# Patient Record
Sex: Female | Born: 1940
Health system: Southern US, Community
[De-identification: ages and names within clinical notes are randomized; demographics above are authoritative.]

## PROBLEM LIST (undated history)

## (undated) DIAGNOSIS — F419 Anxiety disorder, unspecified: Secondary | ICD-10-CM

## (undated) DIAGNOSIS — F329 Major depressive disorder, single episode, unspecified: Secondary | ICD-10-CM

## (undated) DIAGNOSIS — H3530 Unspecified macular degeneration: Secondary | ICD-10-CM

## (undated) DIAGNOSIS — R51 Headache: Secondary | ICD-10-CM

## (undated) DIAGNOSIS — C4491 Basal cell carcinoma of skin, unspecified: Secondary | ICD-10-CM

## (undated) DIAGNOSIS — K219 Gastro-esophageal reflux disease without esophagitis: Secondary | ICD-10-CM

## (undated) DIAGNOSIS — H9313 Tinnitus, bilateral: Secondary | ICD-10-CM

## (undated) DIAGNOSIS — M8580 Other specified disorders of bone density and structure, unspecified site: Secondary | ICD-10-CM

## (undated) DIAGNOSIS — H409 Unspecified glaucoma: Secondary | ICD-10-CM

## (undated) DIAGNOSIS — J302 Other seasonal allergic rhinitis: Secondary | ICD-10-CM

## (undated) DIAGNOSIS — G4710 Hypersomnia, unspecified: Secondary | ICD-10-CM

## (undated) DIAGNOSIS — Z9289 Personal history of other medical treatment: Secondary | ICD-10-CM

## (undated) DIAGNOSIS — R519 Headache, unspecified: Secondary | ICD-10-CM

## (undated) DIAGNOSIS — E785 Hyperlipidemia, unspecified: Secondary | ICD-10-CM

## (undated) DIAGNOSIS — K922 Gastrointestinal hemorrhage, unspecified: Secondary | ICD-10-CM

## (undated) DIAGNOSIS — F32A Depression, unspecified: Secondary | ICD-10-CM

## (undated) DIAGNOSIS — M1990 Unspecified osteoarthritis, unspecified site: Secondary | ICD-10-CM

## (undated) DIAGNOSIS — H8110 Benign paroxysmal vertigo, unspecified ear: Secondary | ICD-10-CM

## (undated) HISTORY — PX: STAPEDES SURGERY: SHX789

## (undated) HISTORY — DX: Other specified disorders of bone density and structure, unspecified site: M85.80

## (undated) HISTORY — PX: JOINT REPLACEMENT: SHX530

## (undated) HISTORY — PX: CATARACT EXTRACTION W/ INTRAOCULAR LENS  IMPLANT, BILATERAL: SHX1307

## (undated) HISTORY — PX: EYE SURGERY: SHX253

## (undated) HISTORY — PX: BLEPHAROPLASTY: SUR158

## (undated) HISTORY — DX: Benign paroxysmal vertigo, unspecified ear: H81.10

## (undated) HISTORY — DX: Hypersomnia, unspecified: G47.10

## (undated) HISTORY — PX: TUBAL LIGATION: SHX77

---
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
## 1998-07-20 ENCOUNTER — Other Ambulatory Visit: Admission: RE | Admit: 1998-07-20 | Discharge: 1998-07-20 | Payer: Self-pay | Admitting: *Deleted

## 1999-07-02 DIAGNOSIS — C4491 Basal cell carcinoma of skin, unspecified: Secondary | ICD-10-CM

## 1999-07-02 HISTORY — DX: Basal cell carcinoma of skin, unspecified: C44.91

## 1999-07-02 HISTORY — PX: BASAL CELL CARCINOMA EXCISION: SHX1214

## 2000-04-22 ENCOUNTER — Other Ambulatory Visit: Admission: RE | Admit: 2000-04-22 | Discharge: 2000-04-22 | Payer: Self-pay | Admitting: *Deleted

## 2004-04-05 ENCOUNTER — Ambulatory Visit (HOSPITAL_COMMUNITY): Admission: RE | Admit: 2004-04-05 | Discharge: 2004-04-05 | Payer: Self-pay | Admitting: Gastroenterology

## 2004-05-11 ENCOUNTER — Encounter: Admission: RE | Admit: 2004-05-11 | Discharge: 2004-05-11 | Payer: Self-pay | Admitting: Otolaryngology

## 2004-05-14 ENCOUNTER — Ambulatory Visit (HOSPITAL_COMMUNITY): Admission: RE | Admit: 2004-05-14 | Discharge: 2004-05-14 | Payer: Self-pay | Admitting: Otolaryngology

## 2004-05-14 ENCOUNTER — Ambulatory Visit (HOSPITAL_BASED_OUTPATIENT_CLINIC_OR_DEPARTMENT_OTHER): Admission: RE | Admit: 2004-05-14 | Discharge: 2004-05-14 | Payer: Self-pay | Admitting: Otolaryngology

## 2007-07-10 ENCOUNTER — Inpatient Hospital Stay (HOSPITAL_COMMUNITY): Admission: RE | Admit: 2007-07-10 | Discharge: 2007-07-14 | Payer: Self-pay | Admitting: Orthopedic Surgery

## 2007-10-20 ENCOUNTER — Inpatient Hospital Stay (HOSPITAL_COMMUNITY): Admission: EM | Admit: 2007-10-20 | Discharge: 2007-10-26 | Payer: Self-pay | Admitting: Emergency Medicine

## 2007-10-23 ENCOUNTER — Encounter: Payer: Self-pay | Admitting: Internal Medicine

## 2009-06-13 ENCOUNTER — Encounter: Admission: RE | Admit: 2009-06-13 | Discharge: 2009-06-13 | Payer: Self-pay | Admitting: Internal Medicine

## 2010-05-22 ENCOUNTER — Encounter
Admission: RE | Admit: 2010-05-22 | Discharge: 2010-06-05 | Payer: Self-pay | Source: Home / Self Care | Admitting: Internal Medicine

## 2010-11-13 NOTE — Consult Note (Signed)
[name], [name]                 ACCOUNT NO.:  [id number]   MEDICAL RECORD NO.:  [id number]          PATIENT TYPE:  INP   LOCATION:  4743                         FACILITY:  [facility]   PHYSICIAN:  Sharlet Salina T. Hoxworth, M.D.DATE OF BIRTH:  1941-04-23   DATE OF CONSULTATION:  10/23/2007  DATE OF DISCHARGE:                                 CONSULTATION   CHIEF COMPLAINT:  Bright red blood per rectum.   HISTORY OF PRESENT ILLNESS:  I was asked by Dr. Carman Ching to  evaluate [name].  She is very pleasant, generally healthy, 70-year-  old white female who was admitted on October 20, 2007, with a lower GI  bleed.  This began at home, actually, the day before with intermittent  bright red blood per rectum mixed with stool.  The patient called Dr.  Waynard Edwards a day after the symptoms started and was admitted.  She continued  to have some intermittent bleeding for about 24 hours during this  hospitalization and received 2 units of packed red blood cells.  She was  very stable throughout.  The bleeding appeared to stop during her  colonoscopy prep two days ago.  Yesterday, she had a colonoscopy, which  revealed no active bleeding.  There was extensive diverticulosis  throughout the left and sigmoid colon seen.  The patient had no further  bleeding after this, and was discharged home this morning.  However,  about lunch time, she had a couple of bowel movements with some red  blood mixed with otherwise normal stool.  This was bright red blood.  She has represented to the emergency room.  She had been feeling well  without lightheadedness and dizziness.  No abdominal pain.  No fever or  chills.  She has no chronic abdominal complaints other than some  occasional mild constipation.  No history of diverticulitis.  She has  not had any significant abdominal surgery.  The patient at the time of  this evaluation has had one nonbloody bowel movement since arriving in  the emergency room.  Her hemoglobin  was 9.3 prior to discharge.  Current  lab is pending.   PAST MEDICAL HISTORY:  She does have a history of the lower GI bleed  about 4 years ago followed only as an outpatient, not requiring  hospitalization at that time, felt possibly hemorrhoidal.  She is also  followed for hyperlipidemia, history of anxiety, and depression.  She  has osteoarthritis and some tachycardia.   PAST SURGICAL HISTORY:  Previous surgery includes left knee replacement  in January 2009, cataract surgery, tubal ligation, and tonsillectomy.   MEDICATIONS:  1. Aspirin 81 mg daily.  2. Darvocet p.r.n. for arthritic pain.  3. Zoloft 100 mg daily.  4. Xanax 0.25 b.i.d. p.r.n.  5. Multivitamins.  6. Zocor.  7. Propranolol 20 mg p.r.n.   ALLERGIES:  CEPHALOSPORIN and TETANUS.   SOCIAL HISTORY:  She is married, retired Runner, broadcasting/film/video, drinks an occasional  glass of wine, and quit cigarettes about 10 years ago.   FAMILY HISTORY:  Significant for heart disease in her parents.   REVIEW  OF SYSTEMS:  GENERAL:  Weight stable.  No fever or chills.  HEENT:  Denies vision, hearing, or swallowing problems.  RESPIRATORY:  No shortness breath, cough, or wheezing.  CARDIAC:  Rare palpitations.  No chest pain.  ABDOMEN:  Negative as above.   PHYSICAL EXAMINATION:  VITAL SIGNS:  She is afebrile, heart rate 94,  blood pressure 132/77, and respirations 20.  GENERAL:  Alert, healthy-appearing white female in no acute stress.  SKIN:  Warm and dry.  No rash or infection.  HEENT:  No palpable mass or thyromegaly.  Sclerae nonicteric.  Oropharynx is clear.  LYMPH NODES:  No cervical, subclavicular, or inguinal nodes palpable.  LUNGS:  Clear without wheezing or increased work of breathing.  CARDIAC:  Regular rate and rhythm.  No murmurs.  No edema.  ABDOMEN:  Soft, very minimal right upper quadrant tenderness.  No  guarding.  No palpable masses.  RECTAL:  Deferred.  EXTREMITIES:  No edema.  NEUROLOGIC:  Alert and fully oriented.   Motor and sensory deficits  appears normal.   LABORATORY DATA:  Normal, laboratory all pending at this time.   ASSESSMENT/PLAN:  Lower GI bleed, intermittent over the last 3-4 days.  Probably diverticular based on recent colonoscopy.  Exact location is  unclear.  The patient is awaiting a bleeding scan, but currently does  not appear to have active bleeding.  She certainly at this point would  not reach the threshold for requiring surgery if this episode  stabilizes.  We will follow during her hospitalization with you.      Lorne Skeens. Hoxworth, M.D.  Electronically Signed     BTH/MEDQ  D:  10/23/2007  T:  10/24/2007  Job:  [id number]

## 2010-11-13 NOTE — Op Note (Signed)
[name], HILGERT                 ACCOUNT NO.:  [id number]   MEDICAL RECORD NO.:  [id number]          PATIENT TYPE:  INP   LOCATION:  2899                         FACILITY:  [facility]   PHYSICIAN:  [name]. [name], M.D. DATE OF BIRTH:  1941-01-06   DATE OF PROCEDURE:  07/10/2007  DATE OF DISCHARGE:                               OPERATIVE REPORT   PREOPERATIVE DIAGNOSES:  Left-knee end-stage osteoarthritis.   POSTOPERATIVE DIAGNOSES:  Left-knee end-stage osteoarthritis.   PROCEDURES PERFORMED:  Left total knee arthroplasty, using DePuy Sigma  rotating platform prosthesis.   ATTENDING SURGEON:  [name]. [name], M.D.   ASSISTANT:  Donnie Coffin. Durwin Nora, P.A.   ANESTHESIA:  General anesthesia plus femoral block anesthesia was used.   ESTIMATED BLOOD LOSS:  Minimal.   FLUID REPLACEMENT:  2000 mL crystalloid.   TOURNIQUET TIME:  One hour and 30 minutes at 300 mmHg.   URINE OUTPUT:  200 mL.   INSTRUMENT COUNT:  Correct.   COMPLICATIONS:  None.   ANTIBIOTICS GIVEN:  None.   INDICATIONS:  Patient is a 70 year old female with end-stage arthritis  of the left knee.  She has failed conservative management.  At this  point, presents for operative treatment to restore function and  eliminate her pain.  Informed consent was obtained.   DESCRIPTION OF PROCEDURE:  After an adequate level of anesthesia was  achieved, the patient was placed in supine on the operating room table,  the left leg correctly identified.  A nonsterile tourniquet was placed  on the proximal thigh.  The left leg was then sterilely prepped and  draped in the usual manner.  We exsanguinated the limb, using an Esmarch  bandage and tourniquet was elevated to 300 mmHg.  A longitudinal midline  incision was created with the knee in flexion.  Dissection was carried  sharply down through the subcutaneous tissues, using Bovie  electrocautery.  A medial parapatellar arthrotomy was created, using a  fresh #10 blade scalpel.  We  everted the patella, then opened the distal  femur with a step-cut drill, using our distal cutting block, which was  set on 5 degrees left with a 10 mm resection.  Once we had resected the  distal femur, the decision was made to proceed to the tibia due to  exposure and went ahead and cut the tibia with an external alignment  jig, 4 mm off the affected medial side.   We next were able to gain exposure to the distal femur and did our  anterior and posterior cuts after sizing the femur that was a size 2.5  and placing our four-in-one cutting block on the distal femur.  We made  our anterior/posterior cuts and chamfer cuts.  We checked our extension  and flexion gaps and were symmetric at 10 mm.  Next, we went ahead and  resected the bone for the posterior cruciate substituting femoral  prosthesis, using the box cut guide.  We went ahead and made our box cut  and then went to our tibia, sizing to a 2.5 and then doing our keel  punch and  drilling out the well for the mobile bearing prosthesis.  Once  we had our tibia and femur prepared, we removed the bone off the  posterior aspect of the distal femur, using a 3/4 inch curved osteotome.  There were some osteophytes on the posterior aspects of the femoral  condyle that were removed.  All extraneous soft tissue, remnants of  meniscal tissue and cruciate ligaments were removed and we placed our  trial components in with a 10 mm insert.  We had excellent soft-tissue  balance and full extension.   We then resurfaced the patella with the free-hand technique, starting  with a 23 mm thickness and going down to 15 to accommodate the 8 mm of  thickness with the 32 patella.  We drilled our lugs for the 32 patella,  placed the patella button on and then took the knee through a full range  of motion.  Again, excellent patellar tracking.  We then went ahead and  removed all trial components, thoroughly irrigated, plugged the end of  the distal femur with  available bone, then went ahead and using vacuum  mixing cement, cemented the components into place with the high  viscosity DePuy Smart Set cement.  With the components in place and  excess cement removed, we allowed the cement to cure, then trialed the  knee again.  Again the 10 mm insert allowed for full extension and gave  [place] excellent balance in flexion/extension for soft-tissue balancing  purposes.   We then placed the real component, checking for excess cement  posteriorly, and then placed the real component in, taking the knee  through a full range of motion.  Again appropriate balance and alignment  was noted.   At this point, we thoroughly irrigated the knee and closed the knee,  using interrupted #1 PDS suture for the parapatellar arthrotomy,  followed by 0 and 2-0 Vicryl for subcutaneous closure and 4-0 Monocryl  for the skin.  Steri-Strips were applied, followed by a sterile  dressing.  The patient tolerated surgery well.      [name]. [name], M.D.  Electronically Signed     SRN/MEDQ  D:  07/10/2007  T:  07/10/2007  Job:  [id number]

## 2010-11-13 NOTE — H&P (Signed)
[name], [name]                 ACCOUNT NO.:  [id number]   MEDICAL RECORD NO.:  [id number]          PATIENT TYPE:  INP   LOCATION:  NA                           FACILITY:  [facility]   PHYSICIAN:  Maisie Fus B. Dixon, P.A.  DATE OF BIRTH:  18-Aug-1940   DATE OF ADMISSION:  DATE OF DISCHARGE:                              HISTORY & PHYSICAL   CHIEF COMPLAINT:  Left knee pain.   HISTORY OF PRESENT ILLNESS:  The patient is a 70 year old female with  worsening left knee pain that has been refractory to conservative  treatment.  The patient has elected to have a left total knee  arthroplasty by Dr. Malon Kindle.   PAST MEDICAL HISTORY:  1. Impaired hearing.  2. Impaired vision.  3. Vertigo.  4. Hyperlipidemia.  5. History of basal cell carcinoma.   FAMILY MEDICAL HISTORY:  Coronary artery disease, congestive heart  failure, and hypertension.   SOCIAL HISTORY:  A patient of Dr. Waynard Edwards, occasional alcohol use, does  not smoke, is married, and does not use any illicit drugs.   DRUG ALLERGIES:  CEFADROXIL gives her hives.   CURRENT MEDICATIONS:  1. Propranolol 20 mg one tab p.o. daily.  2. Zoloft 100 mg p.o. daily.  3. Simvastatin 40 mg one-half q.h.s.  4. Darvocet 100 one to two tabs daily p.r.n.  5. Vitamin C 500 mg two in the morning.  6. 81 mg aspirin one daily.  7. Calcium Citrate one daily p.r.n.  8. Vitamin D 1000 units daily.  9. Clorazepate 3.75 mg one tab three to four times a week.   REVIEW OF SYSTEMS:  Painful range of motion and decreased hearing, he  uses hearing aids.   PHYSICAL EXAMINATION:  VITAL SIGNS:  Pulse 76, respirations 18, blood  pressure 136/82.  GENERAL:  The patient is a healthy-appearing, 70 year old female in no  acute distress, pleasant mood and affect, alert and oriented x3.  The  patient has full range of motion of the cervical spine without any  tenderness.  Cranial nerves II-XII are grossly intact.  CHEST:  Active breath sounds bilaterally with  no wheezes, rhonchi, or  rales.  HEART:  Regular rate and rhythm, no murmur.  ABDOMEN:  Nontender and nondistended with active bowel sounds.  EXTREMITIES:  Moderate tenderness to the left knee especially with  medial joint line crepitus with range of motion.  She has no rashes or  edema.  She is neurovascularly intact to the bilateral upper and lower  extremities.   X-ray show left knee end-stage osteoarthritis.   IMPRESSION:  Left knee end-stage osteoarthritis.   PLAN:  A left total knee arthroplasty by Dr. Malon Kindle.      Thomas B. Ferne Coe.     TBD/MEDQ  D:  06/30/2007  T:  06/30/2007  Job:  [id number]

## 2010-11-13 NOTE — Discharge Summary (Signed)
[name], Betty                 ACCOUNT NO.:  [id number]   MEDICAL RECORD NO.:  [id number]          PATIENT TYPE:  INP   LOCATION:  5003                         FACILITY:  [facility]   PHYSICIAN:  [name]. [name], M.D. DATE OF BIRTH:  28-Aug-1940   DATE OF ADMISSION:  07/10/2007  DATE OF DISCHARGE:  07/14/2007                               DISCHARGE SUMMARY   ADMISSION DIAGNOSIS:  Left knee end-stage osteoarthritis.   DISCHARGE DIAGNOSIS:  Left knee end-stage osteoarthritis.   BRIEF HISTORY:  The patient is a 69 year old female with worsening left  knee pain secondary to osteoarthritis.  The patient has elected to have  a left total knee arthroplasty by Dr. Malon Kindle.   PROCEDURE:  The patient had a left total knee arthroplasty using the  DePuy sigma rotating platform system by Dr. Malon Kindle on July 10, 2007.  Assistant was [employer], PA-C.  General anesthesia plus a  femoral block was used.  Estimated blood loss was minimal and 2000 mL of  fluid replacement.  No complications.   HOSPITAL COURSE:  The patient was admitted on July 10, 2007 for the  above-stated procedure which she tolerated well.  After adequate time in  the post anesthesia care unit, she was transferred up to 5000.  Postop  day #1 she complained of some moderate pain to that left knee.  She was  able to work with her CPM. and was able to work with physical therapy  over the next several days.  Her pain did decrease moderately before  discharge home.  Her labs were within acceptable limits.  No signs of  cellulitis, erythema, or infection to her incision.  Neurovascularly she  was intact.  She was able to work with the CPM to about 0-90 degrees  before discharge home on July 14, 2007.   DISCHARGE/PLAN:  The patient will be discharged home on July 14, 2007  with home physical therapy, and home health nursing.  Her condition is  stable.  Her diet is regular.  The patient has an allergy to  CEFADROXIL  MONOHYDRATE.   DISCHARGE MEDICATIONS:  1. Aspirin 81 mg p.o. daily.  2. Polyethylene glycol 17 grams p.o. nightly.  3. Inderal 20 mg p.o. daily.  4. Zoloft 100 mg p.o. daily.  5. Zocor 20 mg p.o. daily.  6. Coumadin per pharmacy protocol.  7. Darvocet N 100 one to two tablets q.4-6 h. p.r.n. pain.  8. Clorazepate 1.875 mg p.o. every two days.   FOLLOWUP:  The patient will follow back up with Dr. Malon Kindle.      Thomas B. Durwin Nora, P.A.      [name]. [name], M.D.  Electronically Signed    TBD/MEDQ  D:  07/14/2007  T:  07/14/2007  Job:  [id number]

## 2010-11-13 NOTE — Consult Note (Signed)
[name], [name]                 ACCOUNT NO.:  [id number]   MEDICAL RECORD NO.:  [id number]           PATIENT TYPE:   LOCATION:                                 FACILITY:   PHYSICIAN:  Stephani Police, PA    DATE OF BIRTH:  07-04-1940   DATE OF CONSULTATION:  10/21/2007  DATE OF DISCHARGE:                                 CONSULTATION   We were asked to see [name] today in consultation for lower GI bleed  by Dr. Waynard Edwards.   HISTORY OF PRESENT ILLNESS:  This is a 70 year old female with a history  of GI bleed 3 years ago.  She has had several small intermittent bleeds  since, but now they brought her to the hospital.  She describes red  rectal bleeding that started on Saturday and has continued since.  She  has had no severe pain, but describes a small amount of right-sided pain  that has subsequently resolved.  She has no reports of excessive NSAID  use.  No epigastric or lower abdominal pain.  No vomiting.  No fever.  No anorexia or weight loss.  She did have a recent knee replacement  surgery in January and was on Coumadin for a period of time, but that  has been discontinued and her INR is currently 1.3.  Her last  colonoscopy was in October 2005 done by Dr. Carman Ching of [facility].  He found extensive diverticulosis.   PAST MEDICAL HISTORY:  Significant for the GI bleed in 2006,  hyperlipidemia, anxiety and depression, and osteoarthritis.  She is  again status post knee replacement in January 2009.  History of  tachycardia for which she is on propranolol.   SURGERIES:  Include the knee replacement, cataract removal, bilateral  tubal ligation, tonsils and adenoids removal as well as a D&C,  postmenopausal.   ALLERGIES:  She has allergies to cephalosporins and tetanus vaccination.   CURRENT MEDICATIONS:  Include 81 mg aspirin, Darvocet-N 100, Zoloft,  Xanax, multivitamin, Zocor, and propranolol.   SOCIAL HISTORY:  Positive for occasional alcohol.  She smoked for  approximately 20 years, but has been a nonsmoker for several years.   FAMILY HISTORY:  Her family history is positive for colon cancer in a  paternal uncle as well as multiple first-degree relatives with multiple  stomach ailments.   REVIEW OF SYSTEMS:  Negative.   PHYSICAL EXAMINATION:  She is alert and oriented, in no apparent  distress.  Her temperature is 97.6, pulse is 90, respirations are 18,  and blood pressure is 104/59.  Her heart has a regular rate and rhythm.  Her lungs are clear to auscultation bilaterally.  Her abdomen is soft,  nontender, and nondistended with good bowel sounds.  On rectal exam, she  has no external hemorrhoids.  She does have what feels like internal  hemorrhoids.  There is no tenderness on the exam and she has a large  amount of red blood on my glove once it is removed.   LABORATORY DATA:  Labs show a current hemoglobin of 8.2 and hematocrit  24.0.  She was admitted with a hemoglobin of 10.3 with an MCV value of  94.4.  White count yesterday was 6.6.  BMET is within normal limits.  She is guaiac-positive.  On October 20, 2007, her INR was 1.1.  Her PT is  14.7.   ASSESSMENT:  Dr. Carman Ching has seen and examined the patient,  collected a history and reviewed her chart.  His impression is that she  is currently having a lower GI bleed, likely diverticular.  We will  transfuse her today and prep her for a colonoscopy first thing in the  morning.      Stephani Police, [place]     MLY/MEDQ  D:  10/21/2007  T:  10/21/2007  Job:  [id number]   cc:   Gaspar Garbe, M.D.  Redge Gainer. Perini, M.D.  [name] L. Malon Kindle., M.D.

## 2010-11-13 NOTE — H&P (Signed)
NAMEMARQUASHA, BRUTUS                 ACCOUNT NO.:  [id number]   MEDICAL RECORD NO.:  [id number]          PATIENT TYPE:  INP   LOCATION:  4707                         FACILITY:  [facility]   PHYSICIAN:  Gaspar Garbe, M.D.DATE OF BIRTH:  1941/03/01   DATE OF ADMISSION:  10/20/2007  DATE OF DISCHARGE:                              HISTORY & PHYSICAL   CHIEF COMPLAINT:  Bright red blood per rectum.   HISTORY OF PRESENT ILLNESS:  The patient is a 70 year old white female  with a prior history of lower GI bleed for which she has had colonoscopy  in the past three years.  She has never had a source found, and this was  presumed to be hemorrhoidal.  She had an episode about a year and half  ago which was fairly unremarkable and did not notify any physician at  that point.  She indicated that she had had some bleeding on Saturday  and indicated maybe some right upper quadrant pain, tenderness or  fullness but was of a low grade.  As the bleeding seemed to stop by  itself, she did not contact any physician over the course of the  weekend.  She had a little bit more blood from her bowel movement this  afternoon and contacted our office and was instructed by Dr. Waynard Edwards to  get to the emergency room.  She indicates that she has continued to have  bleeding from her rectum and noted some even with her attempts to give a  urinalysis this evening.  Has seemed to have slowed, and she is not  having any cramping or discomfort at this time.  She was evaluated in  the ER and will require admission for continued care.   The patient once again denies any abdominal pain, has not had any trauma  in the area and only takes a baby aspirin.  She has not used any other  nonsteroidal such as ibuprofen, Naprosyn, or headache powders in the  past several months, nor is she taking any herbal.  She appears to be  fairly comfortable at [facility] point.   PAST MEDICAL HISTORY:  1. History of lower GI bleed as noted above.  2. Hyperlipidemia.  3. Anxiety/depression.  4. 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  5. History of tachycardia for which she takes propranolol.   PAST SURGICAL HISTORY:  1. Knee replacement performed January 2009.  2. Cataract surgery.  3. Tubal ligation.  4. Tonsils and adenoids.  5. History of D&C shortly after menopause.   ALLERGIES:  PRESUMED TO A CEPHALOSPORIN ANTIBIOTIC OR TETANUS VACCINE AS  SHE HAD A RASH.   MEDICATIONS:  1. Aspirin 81 mg daily.  2. Darvocet-N 100 one  tablet q.6h. p.r.n. as needed for pain.  The      patient states using this sparingly.  3. Zoloft 100 mg p.o. daily.  4. Xanax 0.25 mg b.i.d. p.r.n.  5. Multivitamin.  6. Zocor 10 mg p.o. daily.  7. Propranolol 20 mg p.o. p.r.n.   SOCIAL HISTORY:  The patient lives in [place] with her husband.  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  She is married, has a 20 pack-year smoking history, but has  not smoked since the early 1990s.  She has 3 to 4 ounces of wine 3 times  a week.   FAMILY HISTORY:  Mother died with a MI at age 3.  Father died of  congestive heart failure and pneumonia.  He also had a history of  bladder cancer.  He was 85.  She has one sister with a history of  tachycardia and hypertension as well.   REVIEW OF SYSTEMS:  The patient denies any fevers, chills, sweats,  headache, nausea, vomiting.  The patient indicates bright red blood from  rectum but no abdominal pain.  She denies any weakness, chest pain,  shortness of breath, or dyspnea on exertion over the course of the day,  and 10-point review of systems is otherwise negative.   ADVANCE DIRECTIVES:  The patient is a full code.   PHYSICAL EXAMINATION:  Temperature 96.5, pulse 108, respiratory rate 22,  blood pressure 115/58, saturating 98% on room air.  GENERAL:  No acute distress.  HEENT:  Normocephalic, atraumatic.  PERRLA.  EOMI.  ENT is within normal  limits.   NECK:  Supple.  No lymphadenopathy, JVD, or bruit.  HEART:  Regular rate and rhythm to tachycardiac.  No murmur, rub or  gallop are appreciated.  LUNGS:  Clear to auscultation bilaterally.  ABDOMEN:  Hyperactive bowel sounds.  No hepatosplenomegaly.  No  tenderness.  No guarding or rebound is noted.  EXTREMITIES:  No clubbing, cyanosis, or edema.  RECTAL:  The patient is grossly heme-positive.  NEUROLOGICAL:  Patient is oriented to person, place and time.   LABORATORY TESTS:  White count 6.6, hemoglobin 10.3, hematocrit 30.4,  platelets 184.  PT is 14.7 with an INR of 1.1.  BUN and creatinine are  19 and 0.8, respectively, potassium 3.9, glucose elevated in the 180s.  Urinalysis showed a specific gravity of 1.030, pH of 5, slight ketones  but negative for blood or infection.   ASSESSMENT/PLAN:  1. Bright red blood per rectum, painless.  This is most likely related      to either diverticular bleed, hemorrhoidal bleed, or less likely      colon polyps or cancer since she has had recent screening in the      past 3 years, although it is possible she will be kept off her      aspirin.  Will be made n.p.o. until she is seen by GI.  She will      most likely require a colonoscopy as an inpatient.  She will have      H&Hs done every six hours and will require transfusion if her      hemoglobin drops below 8.5, seems to be relatively stable now, and      her bleeding may have in fact stopped.  She may be just passing the      remainder of her earlier bleed, and she appears to be      hemodynamically stable otherwise.  I will resuscitate her with IV      fluids and place a second peripheral IV.  She does not require a      central line at this time and appears to have good peripheral      access.  2. Hyperlipidemia.  Continue Zocor.  3. Anxiety/depression.  Continue Zoloft and Xanax as needed.  4.  History of arthritis.  She will take Darvocet or Tylenol type      products as needed for  pain, avoiding nonsteroidals.  The patient      indicates the need for prophylaxis presurgery, and this is noted      because of her artificial knee.  5. The patient will be placed on Protonix as GI prophylaxis, although      her bleeding is most likely lower.  She will be attempted to      ambulate as soon as possible but will do this with assistance.      Given her hemodynamic status, she will not have Lovenox for DVT      prophylaxis given her active bleeding.      Gaspar Garbe, M.D.  Electronically Signed     RWT/MEDQ  D:  10/20/2007  T:  10/21/2007  Job:  [id number]   cc:   Fayrene Fearing L. Malon Kindle., M.D.  Redge Gainer. Perini, M.D.

## 2010-11-13 NOTE — Op Note (Signed)
NAMESANTASIA, REW                 ACCOUNT NO.:  [id number]   MEDICAL RECORD NO.:  [id number]          PATIENT TYPE:  INP   LOCATION:  4707                         FACILITY:  [facility]   PHYSICIAN:  [name] L. Malon Kindle., M.D.DATE OF BIRTH:  06-23-1941   DATE OF PROCEDURE:  10/22/2007  DATE OF DISCHARGE:                               OPERATIVE REPORT   PROCEDURE:  Colonoscopy.   MEDICATIONS:  1. Fentanyl 50 mcg.  2. Versed 6 mg IV.   INDICATION:  Lower GI bleeding along with the previous history of  diverticulosis.   DESCRIPTION OF PROCEDURE:  Procedure explained to the patient.  Consent  obtained.  In the left lateral decubitus position, the Pentax pediatric  scope inserted and advanced.  Prep excellent.  No active bleeding.  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  Once we got beyond this, we were able to advance rapidly through  the cecum.  The ileocecal valve and appendiceal orifice were seen.  The  scope withdrawn and the cecum, ascending, transverse colon were free of  polyps or other lesions.  The descending and sigmoid colon showed  extensive diverticular disease, but no polyps, tumors, or other lesions.  In the rectum, the scope was withdrawn and there were internal  hemorrhoids.  There was no active bleeding at all.  The patient  tolerated the procedure well and went to the recovery room in good  condition.   ASSESSMENT:  1. Lower GI bleeding probably due to extensive diverticulosis.  2. Internal hemorrhoids.   PLAN:  We will follow the patient clinically.  Treat diverticular  disease on MiraLax and fiber and advance her diet.  We will discuss with  her the options at this point.           ______________________________  Llana Aliment. Malon Kindle., M.D.     Waldron Session  D:  10/22/2007  T:  10/22/2007  Job:  [id number]

## 2010-11-16 NOTE — H&P (Signed)
[name], [name]                 ACCOUNT NO.:  [id number]   MEDICAL RECORD NO.:  [id number]          PATIENT TYPE:  AMB   LOCATION:  DSC                          FACILITY:  [facility]   PHYSICIAN:  Hermelinda Medicus, M.D.   DATE OF BIRTH:  05/12/1941   DATE OF ADMISSION:  05/14/2004  DATE OF DISCHARGE:                                HISTORY & PHYSICAL   HISTORY OF PRESENT ILLNESS:  This patient is a 70 year old female who was  seen by our audiologist, concerned about her hearing loss, especially in the  left ear.  She was seen to have an upper frequency sensorineural hearing  loss on both sides, but in the lower frequencies, there was a considerable  divergence with the sensorineural hearing improved markedly, but the  conductive hearing decreased markedly down to a 60-decibel loss at 250 with  a 10-decibel sensorineural hearing status at 250; this at 500 became a 50  and a 30, and then began to merge in the higher frequencies; this is typical  of an early otosclerosis.  The patient wished to hear as well on the left  ear as on the right, which was essentially a 30- to 20-decibel mixed loss.  The speech perception threshold on the left was a 50-decibel loss, where the  right was a 30.  The discrimination score was 80% with an 84% discrimination  score on the right and the impedance study was essentially unremarkable,  with this history and with his audiologic findings indicative of an  otosclerosis on the left ear.  The patient considered all the alternatives  of a hearing aid or having an exploratory tympanotomy with stapedectomy and  she decided to try to improve her hearing via the surgical approach.  She  now enters for an exploratory tympanotomy and possible stapedectomy.   PAST HISTORY:  Her past history is one of a normal sinus rhythm EKG with  nonspecific T wave abnormalities.  Chest x-ray was heart size and  mediastinal contours are unremarkable, lungs are clear.  Visualized  skeleton  is unremarkable.  Her laboratory was also in good condition, showing no  abnormalities.  Her further past history is one will drink occasionally,  never smoked, had a BTL surgery and that was the only surgery, history of  diverticulosis.   REVIEW OF SYSTEMS:  The remainder of her review of systems is essentially  unremarkable.   MEDICATIONS:  The only thing she takes is Tylenol p.r.n.   ALLERGIES:  Her allergy is to CEPHALEXIN, causing hives.   LABORATORY DATA:  Hemoglobin from CBC was 11.5.   PHYSICAL EXAMINATION:  HEENT:  Her ears are clear.  Her tympanic membranes  are clear and free of any infection.  The nose is clear.  The oral cavity is  clear of any ulceration or mass.  True cords, false cords, epiglottis, base  of tongue are clear of any ulceration or mass.  True cord mobility, gag  reflex, tongue mobility, EOMs, facial nerves are all symmetrical.  The  larynx is clear.  NECK:  The neck is free of any  thyromegaly, cervical adenopathy or mass.  CHEST:  Chest is clear.  No rales, rhonchi or wheezes.  CARDIOVASCULAR:  No opening snaps, murmurs or gallops.  ABDOMEN:  Unremarkable.  EXTREMITIES:  Unremarkable.   INITIAL DIAGNOSIS:  1.  Otosclerosis, left, with conductive hearing loss.  2.  History of diverticulosis.  3.  History of high-frequency sensorineural hearing loss.   PLAN:  Our plan is to do MAC anesthesia with general standby and local  anesthesia the left exploratory tympanotomy and possible stapedectomy.  Possibly, we will find a fixed malleus or be able to mobilize the stapes and  avoid some of the postoperative vertigo that is not unusual.       JC/MEDQ  D:  05/14/2004  T:  05/14/2004  Job:  [id number]   cc:   Loraine Leriche A. Waynard Edwards, M.D.  [address]  [place]  [place] [address]  Fax: [phone]

## 2010-11-16 NOTE — Discharge Summary (Signed)
Betty, [name]                 ACCOUNT NO.:  [id number]   MEDICAL RECORD NO.:  [id number]          PATIENT TYPE:  INP   LOCATION:  4707                         FACILITY:  [facility]   PHYSICIAN:  Mark A. Perini, M.D.   DATE OF BIRTH:  December 21, 1940   DATE OF ADMISSION:  10/20/2007  DATE OF DISCHARGE:  10/23/2007                               DISCHARGE SUMMARY   DISCHARGE DIAGNOSES:  1. Lower gastrointestinal bleeding, presumed to be secondary to      diverticulosis in the setting of aspirin therapy.  2. Anemia of acute blood loss.  3. History of palpitations.  4. Anxiety and depression.  5. Hyperlipidemia.  6. Osteoarthritis.   PROCEDURES:  1. GI and general surgery consultation.  2. Colonoscopy performed on October 22, 2007, which showed no active      bleeding but she did have external hemorrhoids and internal      hemorrhoids and severe diverticulosis involving the descending and      sigmoid colon.  3. Nuclear medicine bleeding scan performed on October 23, 2007, which      was negative.  4. Transfusion of 2 units of packed red cells   DISCHARGE MEDICATIONS:  1. She is to stop aspirin and use Darvocet-N 100 one every 6 hours as      needed.  2. She is to resume Zoloft 100 mg once daily.  3. Xanax 0.25 mg twice a day as needed.  4. Multivitamin daily.  5. Zocor 10 mg each evening.  6. Propranolol 20 mg each morning and she may repeat dose 1 time in      the p.m. if needed.  7. Over-the-counter ferrous gluconate 325 mg once daily.  8. Over-the-counter Colace 1 mg 2-3 tablets daily as needed as a stool      softener.  9. MiraLax 17 grams in 8 ounces of water daily as needed as a      laxative.   HISTORY OF PRESENT ILLNESS:  [name] is a pleasant 70 year old female  who had a history of lower GI bleed in the last 3 years.  It was  presumed to be hemorrhoidal or diverticular related.  She noted some  bleeding a few days prior to admission with some low-grade right upper  quadrant tenderness and fullness.  The bleeding seemed to stop by itself  and she did not contact the physician over the weekend.  However, she  had some bright red blood on the afternoon of admission and was  instructed to come to the emergency room.  She continued to have  bleeding from her rectum of bright red blood and was evaluated by the  emergency room and felt to require admission.   HOSPITAL COURSE:  Karuna Balducci was admitted to a telemetry bed.  She did  drop her hemoglobin at 8.2 and was transfused 2 units of packed red  cells with appropriate response.  She underwent colonoscopy with results  as noted above.  On October 23, 2007, she was actually discharged.  However, as soon as she got home, she had recurrent bright red  bleeding  per rectum and returned immediately to the hospital and was immediately  readmitted within a few hours of her original discharge on October 23, 2007.  At that time, she was monitored.  She did not drop her hemoglobin  further.  She had no more recurrent bleeding.  She did have a nuclear  medicine bleeding scan, but at that point it was negative.  It was felt  that her bleeding was most likely due to diverticulosis.  Discussion was  made as to whether or not to proceed with a partial colectomy, however,  this was deferred at the current time.  Her followup algorithm at this  point will be that, if she has severely bleeding she would need a left-  sided colectomy, if she has re-bleeding but is medically stable she  should repeat a bleeding scan, and if that is positive resect the region  of interest, if it is negative and she is stable, she could consider  capsule endoscopy or go ahead and proceed with a left colectomy and if  she has no further bleeding, of course she will not need any further  procedures for the time being.  By October 26, 2007, she was deemed stable  for discharge home.   DISCHARGE PHYSICAL EXAMINATION:  VITAL SIGNS:  Temperature 97.9,   afebrile, pulse 85, respiratory rate 18, blood pressure 106/61, 97%  saturation on room air.  GENERAL:  She is in no acute distress.  She was sitting on the bedside.  LUNGS:  Clear to auscultation bilaterally.  HEART:  Regular rate and rhythm with a 1/6 murmur heard to left and  right sternal border in systole.  ABDOMEN:  Soft, nontender, nondistended with no mass or  hepatosplenomegaly.   DISCHARGE LABORATORY DATA:  White count 4.1 with a normal differential,  hemoglobin 9.1, platelet count 149,000.  LFTs were normal with exception  of an albumin of 2.8, BUN 5, creatinine 0.55, glucose 100.   DISCHARGE INSTRUCTIONS:  She is to follow a heart-healthy diet.  She is  to follow up with Dr. Randa Evens per his instructions.  She is to follow up  with Dr. Waynard Edwards in 3-4 weeks.  She is return to the lab next week for  followup blood work.  She is to follow up with Dr. Johna Sheriff in the next  2-3 weeks.      Mark A. Perini, M.D.  Electronically Signed     MAP/MEDQ  D:  11/26/2007  T:  11/26/2007  Job:  [id number]   cc:   Fayrene Fearing L. Malon Kindle., M.D.  Lorne Skeens. Hoxworth, M.D.

## 2010-11-16 NOTE — Op Note (Signed)
NAMEADRIEANA, [name]                 ACCOUNT NO.:  [id number]   MEDICAL RECORD NO.:  [id number]          PATIENT TYPE:  AMB   LOCATION:  DSC                          FACILITY:  [facility]   PHYSICIAN:  Hermelinda Medicus, M.D.   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   OPERATION:  Exploratory tympanotomy and stapes mobilization.   SURGEON:  Hermelinda Medicus, M.D.   ANESTHESIA:  Local MAC with Dr. Gelene Mink.   DESCRIPTION OF PROCEDURE:  The patient placed in the supine position under  local anesthesia, 1% Xylocaine with epinephrine injected into the four  quadrants of the external ear canal and after the patient was prepped and  draped in the appropriate manner, using Betadine scrub and then Betadine  prep and after the usual otologic head drape and ear drape was placed, the  external ear canal was carefully examined.  The four quadrants of the  external ear canal were injected with 1% Xylocaine with epinephrine.  The  patient was very much relaxed and had no problem with this.  A tympanotomy  incision was then carried out in the posterior aspect of the external ear  canal carried down along the posterior canal wall.  The annulus was elevated  appropriately and the entire tympanic membrane was reflected anteriorly  toward the malleus.  The incus could be seen in part and the chorda tympany  could also be seen and this was guarded carefully.  Posterior canal wall was  taken down using the stapes curette.  Once this was completed, the chorda  tympany was then elevated away from where we were working on the posterior  canal wall and this was taken down somewhat more.  Then we could see the  entire stapes.  We measured this at a 4.7 that we would make if we needed to  do a  stapedectomy, however, we also started to stress the crura of the  stapes which were originally found to be somewhat fixed but then we were  able to get some mobility and then we were able to stress it anterior,  posterior, laterally and able to get more and more mobility and with this we  could see the foot plate of the staples moving very nicely now and then we  were able to elicit a round window reflex showing that the stapes mobility  was in good condition.  We also checked of course the malleus and incus for  its mobility.  With this, we felt that we would do nothing more as the  stapes was now considerably mobile as well as the incus and malleus.  We had  good conduction.  We then placed a piece of Gelfoam over the foot plate to  just protect it as if we created a __________ fistula and we then placed the  tympanic membrane and canal skin back in its proper position.  We then  tested her hearing by essentially using the lowest voice I possibly could in  asking her numbers to repeat and she repeated all but one accurately.  With  this, we then felt she had a successful postoperative course and placed  Gelfoam in the external ear canal along the incisional site to be followed  by Neosporin ointment in the external  canal to be followed by cotton.  The patient awakened well.  She has no  vertigo and had no facial nerve abnormalities and the patient is doing well  postoperatively.  The follow-up will be in one week, three weeks, six weeks,  three months, six months and a year.       JC/MEDQ  D:  05/14/2004  T:  05/14/2004  Job:  [id number]   cc:   Loraine Leriche A. Waynard Edwards, M.D.  [address]  [place]  [place] [address]  Fax: [phone]

## 2010-11-16 NOTE — Op Note (Signed)
[name], [name]                 ACCOUNT NO.:  [id number]   MEDICAL RECORD NO.:  [id number]          PATIENT TYPE:  AMB   LOCATION:  ENDO                         FACILITY:  [facility]   PHYSICIAN:  [name]., M.D.DATE OF BIRTH:  1941-04-30   DATE OF PROCEDURE:  04/05/2004  DATE OF DISCHARGE:                                 OPERATIVE REPORT   PROCEDURE:  Colonoscopy.   MEDICATIONS:  Fentanyl 70 mcg, Versed 7 mg IV.   SCOPE:  Olympus pediatric adjustable colonoscope.   INDICATIONS:  The patient had a recent bout of rectal bleeding we thought  might have been hemorrhoids.  It has subsequently cleared up.   DESCRIPTION OF PROCEDURE:  With the patient in the left lateral decubitus  position, the Olympus scope was inserted and advanced.  The prep was  excellent and we were able to easily advance around to the cecum.  Ileocecal  valve and appendiceal orifice were seen. There were no AVMs seen in the  cecum.  There was no signs of active or recent bleeding.  The scope was  withdrawn in the cecum.  Ascending colon and transverse colon were seen  well.  The descending and sigmoid colon revealed extensive diverticular  disease with multiple small and large mouth diverticula throughout the  entire left colon.  No active bleeding.  There were some balls of hard stool  seen.  No polyps were seen throughout the entire colon.  The scope was  withdrawn down in the rectum and rectum was free of polyps.  There were  small internal hemorrhoids in the rectum.  The scope was withdrawn.  The  patient tolerated the procedure well.   ASSESSMENT:  1.  Rectal bleeding, probably secondary to diverticulosis.  78.1.  2.  Extensive diverticular disease, left colon. 562.1.  3.  Small internal hemorrhoids. 455.0.   PLAN:  We will give her diverticulosis information.  We will have the  patient take a high-fiber diet and fiber supplements.  We will start on  MiraLax daily.  See back in the office in  three months or p.r.n.       JLE/MEDQ  D:  04/05/2004  T:  04/05/2004  Job:  [id number]   cc:   Loraine Leriche A. Waynard Edwards, M.D.  [address]  [place]  [place] [address]  Fax: [phone]

## 2011-03-20 LAB — CBC
HCT: 32.6 — ABNORMAL LOW
HCT: 34.1 — ABNORMAL LOW
HCT: 35.7 — ABNORMAL LOW
HCT: 42.5
Hemoglobin: 11.3 — ABNORMAL LOW
Hemoglobin: 11.6 — ABNORMAL LOW
Hemoglobin: 12.4
Hemoglobin: 14.5
MCHC: 34
MCHC: 34.1
MCHC: 34.6
MCHC: 34.7
MCV: 95.7
MCV: 96.2
MCV: 96.4
MCV: 97.1
Platelets: 111 — ABNORMAL LOW
Platelets: 120 — ABNORMAL LOW
Platelets: 121 — ABNORMAL LOW
Platelets: 191
RBC: 3.38 — ABNORMAL LOW
RBC: 3.51 — ABNORMAL LOW
RBC: 3.71 — ABNORMAL LOW
RBC: 4.44
RDW: 12.8
RDW: 12.8
RDW: 13
RDW: 13.2
WBC: 5.7
WBC: 5.9
WBC: 7.9
WBC: 9.2

## 2011-03-20 LAB — URINALYSIS, ROUTINE W REFLEX MICROSCOPIC
Bilirubin Urine: NEGATIVE
Glucose, UA: NEGATIVE
Hgb urine dipstick: NEGATIVE
Ketones, ur: NEGATIVE
Nitrite: NEGATIVE
Protein, ur: NEGATIVE
Specific Gravity, Urine: 1.018
Urobilinogen, UA: 0.2
pH: 6.5

## 2011-03-20 LAB — PROTIME-INR
INR: 1
INR: 1.2
INR: 1.7 — ABNORMAL HIGH
INR: 2.2 — ABNORMAL HIGH
INR: 2.3 — ABNORMAL HIGH
Prothrombin Time: 13.3
Prothrombin Time: 15.8 — ABNORMAL HIGH
Prothrombin Time: 20.5 — ABNORMAL HIGH
Prothrombin Time: 25.5 — ABNORMAL HIGH
Prothrombin Time: 26.4 — ABNORMAL HIGH

## 2011-03-20 LAB — DIFFERENTIAL
Basophils Absolute: 0
Basophils Relative: 0
Eosinophils Absolute: 0.1
Eosinophils Relative: 1
Lymphocytes Relative: 22
Lymphs Abs: 1.2
Monocytes Absolute: 0.6
Monocytes Relative: 11
Neutro Abs: 3.7
Neutrophils Relative %: 66

## 2011-03-20 LAB — TYPE AND SCREEN
ABO/RH(D): O POS
Antibody Screen: NEGATIVE

## 2011-03-20 LAB — BASIC METABOLIC PANEL
BUN: 4 — ABNORMAL LOW
BUN: 5 — ABNORMAL LOW
BUN: 7
BUN: 9
CO2: 29
CO2: 29
CO2: 31
CO2: 31
Calcium: 8.4
Calcium: 8.5
Calcium: 8.5
Calcium: 9.6
Chloride: 100
Chloride: 101
Chloride: 103
Chloride: 104
Creatinine, Ser: 0.64
Creatinine, Ser: 0.67
Creatinine, Ser: 0.72
Creatinine, Ser: 0.73
GFR calc Af Amer: >60
GFR calc Af Amer: >60
GFR calc Af Amer: >60
GFR calc Af Amer: >60
GFR calc non Af Amer: >60
GFR calc non Af Amer: >60
GFR calc non Af Amer: >60
GFR calc non Af Amer: >60
Glucose, Bld: 124 — ABNORMAL HIGH
Glucose, Bld: 127 — ABNORMAL HIGH
Glucose, Bld: 139 — ABNORMAL HIGH
Glucose, Bld: 98
Potassium: 3.6
Potassium: 3.7
Potassium: 4
Potassium: 4.2
Sodium: 136
Sodium: 137
Sodium: 138
Sodium: 143

## 2011-03-20 LAB — APTT: aPTT: 29

## 2011-03-20 LAB — ABO/RH: ABO/RH(D): O POS

## 2011-03-26 LAB — TYPE AND SCREEN: Antibody Screen: NEGATIVE

## 2011-03-26 LAB — COMPREHENSIVE METABOLIC PANEL
ALT: 11
ALT: 13
ALT: 14
ALT: 14
AST: 19
AST: 28
Albumin: 2.7 — ABNORMAL LOW
Albumin: 2.8 — ABNORMAL LOW
Albumin: 3 — ABNORMAL LOW
Albumin: 3.1 — ABNORMAL LOW
Albumin: 3.1 — ABNORMAL LOW
Alkaline Phosphatase: 43
Alkaline Phosphatase: 52
Alkaline Phosphatase: 59
BUN: 15
BUN: 3 — ABNORMAL LOW
BUN: 4 — ABNORMAL LOW
BUN: 6
CO2: 26
CO2: 30
Calcium: 8.3 — ABNORMAL LOW
Calcium: 8.4
Calcium: 8.6
Calcium: 8.7
Calcium: 8.8
Chloride: 106
Chloride: 109
Chloride: 112
Creatinine, Ser: 0.51
Creatinine, Ser: 0.55
Creatinine, Ser: 0.6
Creatinine, Ser: 0.6
Creatinine, Ser: 0.65
GFR calc Af Amer: >60
GFR calc Af Amer: >60
GFR calc non Af Amer: >60
GFR calc non Af Amer: >60
Glucose, Bld: 119 — ABNORMAL HIGH
Glucose, Bld: 130 — ABNORMAL HIGH
Glucose, Bld: 93
Potassium: 3.5
Potassium: 3.7
Potassium: 3.8
Potassium: 3.9
Sodium: 139
Sodium: 142
Sodium: 144
Total Bilirubin: 0.5
Total Bilirubin: 0.5
Total Bilirubin: 0.6
Total Protein: 4.6 — ABNORMAL LOW
Total Protein: 4.8 — ABNORMAL LOW
Total Protein: 5 — ABNORMAL LOW
Total Protein: 5.2 — ABNORMAL LOW
Total Protein: 5.5 — ABNORMAL LOW

## 2011-03-26 LAB — CBC
HCT: 25.1 — ABNORMAL LOW
HCT: 27 — ABNORMAL LOW
HCT: 27 — ABNORMAL LOW
HCT: 28.1 — ABNORMAL LOW
HCT: 29.3 — ABNORMAL LOW
HCT: 29.3 — ABNORMAL LOW
HCT: 29.4 — ABNORMAL LOW
HCT: 30.4 — ABNORMAL LOW
Hemoglobin: 10.2 — ABNORMAL LOW
Hemoglobin: 10.3 — ABNORMAL LOW
Hemoglobin: 10.3 — ABNORMAL LOW
Hemoglobin: 8.6 — ABNORMAL LOW
Hemoglobin: 9.1 — ABNORMAL LOW
Hemoglobin: 9.3 — ABNORMAL LOW
Hemoglobin: 9.8 — ABNORMAL LOW
MCHC: 33.7
MCHC: 33.8
MCHC: 33.9
MCHC: 34.6
MCHC: 34.8
MCHC: 34.8
MCV: 91.1
MCV: 91.4
MCV: 92.2
MCV: 92.4
Platelets: 128 — ABNORMAL LOW
Platelets: 148 — ABNORMAL LOW
Platelets: 168
Platelets: 176
RBC: 2.66 — ABNORMAL LOW
RBC: 3.17 — ABNORMAL LOW
RBC: 3.22 — ABNORMAL LOW
RDW: 12.6
RDW: 14.6
RDW: 14.6
RDW: 14.7
RDW: 14.8
RDW: 15.2
RDW: 15.5
WBC: 4.1
WBC: 4.6
WBC: 4.9
WBC: 6.5
WBC: 7.2

## 2011-03-26 LAB — URINALYSIS, ROUTINE W REFLEX MICROSCOPIC
Bilirubin Urine: NEGATIVE
Glucose, UA: NEGATIVE
Hgb urine dipstick: NEGATIVE
Ketones, ur: 15 — AB
Nitrite: NEGATIVE
Protein, ur: NEGATIVE
Specific Gravity, Urine: >1.03 — ABNORMAL HIGH
Urobilinogen, UA: 0.2
pH: 5

## 2011-03-26 LAB — DIFFERENTIAL
Basophils Absolute: 0
Basophils Absolute: 0
Basophils Absolute: 0
Basophils Absolute: 0
Basophils Relative: 0
Basophils Relative: 0
Basophils Relative: 0
Basophils Relative: 1
Eosinophils Absolute: 0.1
Eosinophils Absolute: 0.1
Eosinophils Absolute: 0.2
Eosinophils Relative: 1
Eosinophils Relative: 4
Lymphocytes Relative: 33
Lymphocytes Relative: 38
Lymphocytes Relative: 40
Lymphs Abs: 1.4
Lymphs Abs: 2.1
Monocytes Absolute: 0.4
Monocytes Absolute: 0.4
Monocytes Absolute: 0.5
Monocytes Absolute: 0.5
Monocytes Absolute: 0.6
Monocytes Absolute: 0.6
Monocytes Relative: 12
Monocytes Relative: 13 — ABNORMAL HIGH
Monocytes Relative: 6
Neutro Abs: 2
Neutro Abs: 2.4
Neutrophils Relative %: 44
Neutrophils Relative %: 77

## 2011-03-26 LAB — POCT I-STAT, CHEM 8
BUN: 19
Creatinine, Ser: 0.8
Hemoglobin: 10.2 — ABNORMAL LOW
Potassium: 3.9
Sodium: 139

## 2011-03-26 LAB — BASIC METABOLIC PANEL
GFR calc non Af Amer: >60
Glucose, Bld: 109 — ABNORMAL HIGH
Potassium: 3.6
Sodium: 138

## 2011-03-26 LAB — HEMOGLOBIN AND HEMATOCRIT, BLOOD
HCT: 23.5 — ABNORMAL LOW
Hemoglobin: 8 — ABNORMAL LOW

## 2011-03-26 LAB — OCCULT BLOOD X 1 CARD TO LAB, STOOL: Fecal Occult Bld: POSITIVE

## 2011-03-26 LAB — PHOSPHORUS: Phosphorus: 3.2

## 2011-11-29 ENCOUNTER — Encounter (HOSPITAL_COMMUNITY): Payer: Self-pay

## 2011-11-29 ENCOUNTER — Inpatient Hospital Stay (HOSPITAL_COMMUNITY): Admission: RE | Admit: 2011-11-29 | Discharge: 2011-11-29 | Payer: Self-pay | Source: Ambulatory Visit

## 2011-11-29 HISTORY — DX: Other seasonal allergic rhinitis: J30.2

## 2011-11-29 HISTORY — DX: Gastro-esophageal reflux disease without esophagitis: K21.9

## 2011-11-29 HISTORY — DX: Anxiety disorder, unspecified: F41.9

## 2011-11-29 HISTORY — DX: Hyperlipidemia, unspecified: E78.5

## 2011-11-29 NOTE — Pre-Procedure Instructions (Signed)
20 [name]  11/29/2011   Your procedure is scheduled on:  Friday December 20, 2011.  Report to [facility] at 0530 AM.  Call this number if you have problems the morning of surgery: [phone]   Remember:   Do not eat food:After Midnight.  May have clear liquids: up to 4 Hours before arrival until 0130 am.  Clear liquids include soda, tea, black coffee, apple or grape juice, broth.  Take these medicines the morning of surgery with A SIP OF WATER:    Do not wear jewelry, make-up or nail polish.  Do not wear lotions, powders, or perfumes. You may wear deodorant.  Do not shave 48 hours prior to surgery. Men may shave face and neck.  Do not bring valuables to the hospital.  Contacts, dentures or bridgework may not be worn into surgery.  Leave suitcase in the car. After surgery it may be brought to your room.  For patients admitted to the hospital, checkout time is 11:00 AM the day of discharge.   Patients discharged the day of surgery will not be allowed to drive home.  Name and phone number of your driver:   Special Instructions: CHG Shower Use Special Wash: 1/2 bottle night before surgery and 1/2 bottle morning of surgery.   Please read over the following fact sheets that you were given: Pain Booklet, Coughing and Deep Breathing, Total Joint Packet, MRSA Information and Surgical Site Infection Prevention

## 2011-12-11 ENCOUNTER — Encounter (HOSPITAL_COMMUNITY)
Admission: RE | Admit: 2011-12-11 | Discharge: 2011-12-11 | Disposition: A | Discharge status: Home / Self Care | Payer: Medicare Other | Source: Ambulatory Visit | Attending: Orthopedic Surgery | Admitting: Orthopedic Surgery

## 2011-12-11 ENCOUNTER — Encounter (HOSPITAL_COMMUNITY): Payer: Self-pay | Admitting: Pharmacy Technician

## 2011-12-11 LAB — BASIC METABOLIC PANEL
BUN: 9 mg/dL (ref 6–23)
CO2: 30 mEq/L (ref 19–32)
GFR calc non Af Amer: >90 mL/min (ref >90)
Glucose, Bld: 101 mg/dL — ABNORMAL HIGH (ref 70–99)
Potassium: 4.3 mEq/L (ref 3.5–5.1)

## 2011-12-11 LAB — CBC
Hemoglobin: 14.1 g/dL (ref 12.0–15.0)
MCH: 31.4 pg (ref 26.0–34.0)
RBC: 4.49 MIL/uL (ref 3.87–5.11)

## 2011-12-11 LAB — TYPE AND SCREEN
ABO/RH(D): O POS
Antibody Screen: NEGATIVE

## 2011-12-11 LAB — PROTIME-INR
INR: 1.01 (ref 0.00–1.49)
Prothrombin Time: 13.5 seconds (ref 11.6–15.2)

## 2011-12-11 MED ORDER — CHLORHEXIDINE GLUCONATE 4 % EX LIQD: 60.0000 mL | Freq: Once | CUTANEOUS | Status: DC

## 2011-12-12 NOTE — Consult Note (Signed)
Anesthesia Chart Review:  Patient is a 71 year old female scheduled for right TKA on 12/20/11. Dr. Waynard Edwards is her Internist, and he cleared her for this procedure.  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  EKG from 10/23/11 (PCP) showed NSR, probable nonspecific T wave abnormality in anterior leads which was present on her 07/07/07 EKG.  She had a normal nuclear stress test on 05/14/07 as part of her preoperative evaluation for TKA.  There was normal perfusion, EF 86%.  CXR on 12/11/11 showed no active disease.  Labs noted. UA was not done.  I've asked the Short Stay nursing staff to clarify this with Dr. [name]' office.  Plan to proceed.  Shonna Chock, PA-C

## 2011-12-17 NOTE — H&P (Signed)
CC: right Knee pain HPI:   71  Y/o female    With worsening knee pain secondary to osteoarthritis. Patient has elected to have a total knee arthroplasty to decrease pain and increase function. PMH: pneumonia, depression, hyperlipidemia, anemia Family History: hypertension Social: Dr. Waynard Edwards, no smoking, occasional etoh Meds: propanolol, sertaline, zocor, omeprazole, proair inhaler, timolol, norco, vitamins, miralax,  Allergies: morphine, ? Sulfa, cefadroxil ROS: Pain with ambulation Vitals: 142/82, 74 12 PE: Alert and appropriate    In no acute distress Cranial 2-12 grossly intact Cervical spine full rom without pain Lungs: bilateral breath sounds with no wheeze rhonchi or rales Heart: regular rate and rhythm with no murmur Abd: nontender nondistended with active bowel sounds Ext: moderate pain with rom of right knee with crepitus, antalgic gait, neurovascularly intact distally. 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

## 2011-12-20 ENCOUNTER — Encounter (HOSPITAL_COMMUNITY): Payer: Self-pay | Admitting: Vascular Surgery

## 2011-12-20 ENCOUNTER — Inpatient Hospital Stay (HOSPITAL_COMMUNITY)
Admission: RE | Admit: 2011-12-20 | Discharge: 2011-12-23 | DRG: 470 | Disposition: A | Discharge status: Home Health | Payer: Medicare Other | Source: Ambulatory Visit | Attending: Orthopedic Surgery | Admitting: Orthopedic Surgery

## 2011-12-20 ENCOUNTER — Encounter (HOSPITAL_COMMUNITY)
Admission: RE | Disposition: A | Discharge status: Home Health | Payer: Self-pay | Source: Ambulatory Visit | Attending: Orthopedic Surgery

## 2011-12-20 ENCOUNTER — Encounter (HOSPITAL_COMMUNITY): Payer: Self-pay | Admitting: *Deleted

## 2011-12-20 ENCOUNTER — Inpatient Hospital Stay (HOSPITAL_COMMUNITY): Payer: Medicare Other

## 2011-12-20 ENCOUNTER — Ambulatory Visit (HOSPITAL_COMMUNITY): Payer: Medicare Other | Admitting: Vascular Surgery

## 2011-12-20 DIAGNOSIS — E785 Hyperlipidemia, unspecified: Secondary | ICD-10-CM | POA: Diagnosis present

## 2011-12-20 DIAGNOSIS — Z79899 Other long term (current) drug therapy: Secondary | ICD-10-CM

## 2011-12-20 DIAGNOSIS — IMO0002 Reserved for concepts with insufficient information to code with codable children: Principal | ICD-10-CM | POA: Diagnosis present

## 2011-12-20 DIAGNOSIS — M159 Polyosteoarthritis, unspecified: Secondary | ICD-10-CM | POA: Diagnosis present

## 2011-12-20 DIAGNOSIS — F411 Generalized anxiety disorder: Secondary | ICD-10-CM | POA: Diagnosis present

## 2011-12-20 DIAGNOSIS — Z96659 Presence of unspecified artificial knee joint: Secondary | ICD-10-CM

## 2011-12-20 DIAGNOSIS — M1710 Unilateral primary osteoarthritis, unspecified knee: Principal | ICD-10-CM | POA: Diagnosis present

## 2011-12-20 DIAGNOSIS — Z7901 Long term (current) use of anticoagulants: Secondary | ICD-10-CM

## 2011-12-20 DIAGNOSIS — K219 Gastro-esophageal reflux disease without esophagitis: Secondary | ICD-10-CM | POA: Diagnosis present

## 2011-12-20 HISTORY — DX: Personal history of other medical treatment: Z92.89

## 2011-12-20 HISTORY — DX: Depression, unspecified: F32.A

## 2011-12-20 HISTORY — PX: TOTAL KNEE ARTHROPLASTY: SHX125

## 2011-12-20 HISTORY — DX: Unspecified osteoarthritis, unspecified site: M19.90

## 2011-12-20 HISTORY — DX: Major depressive disorder, single episode, unspecified: F32.9

## 2011-12-20 HISTORY — DX: Gastrointestinal hemorrhage, unspecified: K92.2

## 2011-12-20 SURGERY — ARTHROPLASTY, KNEE, TOTAL
Anesthesia: Regional | Site: Knee | Laterality: Right | Wound class: Clean

## 2011-12-20 MED ORDER — HYDROCODONE-ACETAMINOPHEN 5-325 MG PO TABS
1.0000 | ORAL_TABLET | Freq: Four times a day (QID) | ORAL | Status: DC | PRN
  Administered 2011-12-20 – 2011-12-22 (×6): 2 via ORAL
  Filled 2011-12-20 (×6): qty 2

## 2011-12-20 MED ORDER — VANCOMYCIN HCL IN DEXTROSE 1-5 GM/200ML-% IV SOLN: 1000.0000 mg | INTRAVENOUS | Status: DC

## 2011-12-20 MED ORDER — HYDROMORPHONE HCL PF 1 MG/ML IJ SOLN
INTRAMUSCULAR | Status: AC
  Filled 2011-12-20: qty 1

## 2011-12-20 MED ORDER — VANCOMYCIN HCL IN DEXTROSE 1-5 GM/200ML-% IV SOLN
INTRAVENOUS | Status: AC
  Filled 2011-12-20: qty 200

## 2011-12-20 MED ORDER — ADULT MULTIVITAMIN W/MINERALS CH
1.0000 | ORAL_TABLET | Freq: Every day | ORAL | Status: DC
  Administered 2011-12-20 – 2011-12-23 (×4): 1 via ORAL
  Filled 2011-12-20 (×4): qty 1

## 2011-12-20 MED ORDER — ONDANSETRON HCL 4 MG/2ML IJ SOLN: 4.0000 mg | Freq: Four times a day (QID) | INTRAMUSCULAR | Status: DC | PRN

## 2011-12-20 MED ORDER — AMOXICILLIN 500 MG PO CAPS: 2000.0000 mg | ORAL_CAPSULE | ORAL | Status: DC

## 2011-12-20 MED ORDER — ALPRAZOLAM 0.25 MG PO TABS: 0.2500 mg | ORAL_TABLET | Freq: Every evening | ORAL | Status: DC | PRN

## 2011-12-20 MED ORDER — CELECOXIB 200 MG PO CAPS
200.0000 mg | ORAL_CAPSULE | Freq: Two times a day (BID) | ORAL | Status: DC
  Administered 2011-12-20 – 2011-12-23 (×6): 200 mg via ORAL
  Filled 2011-12-20 (×7): qty 1

## 2011-12-20 MED ORDER — ACETAMINOPHEN 650 MG RE SUPP: 650.0000 mg | Freq: Four times a day (QID) | RECTAL | Status: DC | PRN

## 2011-12-20 MED ORDER — WARFARIN SODIUM 5 MG PO TABS: 5.0000 mg | ORAL_TABLET | Freq: Every day | ORAL | Status: DC

## 2011-12-20 MED ORDER — METOCLOPRAMIDE HCL 5 MG/ML IJ SOLN: 5.0000 mg | Freq: Three times a day (TID) | INTRAMUSCULAR | Status: DC | PRN

## 2011-12-20 MED ORDER — ALBUTEROL SULFATE HFA 108 (90 BASE) MCG/ACT IN AERS: 2.0000 | INHALATION_SPRAY | Freq: Every day | RESPIRATORY_TRACT | Status: DC | PRN

## 2011-12-20 MED ORDER — CALCIUM CITRATE + PO TABS: 1.0000 | ORAL_TABLET | Freq: Two times a day (BID) | ORAL | Status: DC

## 2011-12-20 MED ORDER — WARFARIN SODIUM 5 MG PO TABS
5.0000 mg | ORAL_TABLET | Freq: Once | ORAL | Status: AC
  Administered 2011-12-20: 5 mg via ORAL
  Filled 2011-12-20: qty 1

## 2011-12-20 MED ORDER — ONDANSETRON HCL 4 MG PO TABS: 4.0000 mg | ORAL_TABLET | Freq: Four times a day (QID) | ORAL | Status: DC | PRN

## 2011-12-20 MED ORDER — OXYCODONE HCL 5 MG PO TABS
5.0000 mg | ORAL_TABLET | ORAL | Status: DC | PRN
  Administered 2011-12-20: 10 mg via ORAL
  Administered 2011-12-21: 5 mg via ORAL
  Administered 2011-12-22 – 2011-12-23 (×5): 10 mg via ORAL
  Filled 2011-12-20: qty 1
  Filled 2011-12-20 (×5): qty 2

## 2011-12-20 MED ORDER — METOCLOPRAMIDE HCL 10 MG PO TABS: 5.0000 mg | ORAL_TABLET | Freq: Three times a day (TID) | ORAL | Status: DC | PRN

## 2011-12-20 MED ORDER — METHOCARBAMOL 500 MG PO TABS: 500.0000 mg | ORAL_TABLET | Freq: Three times a day (TID) | ORAL | Status: AC | PRN

## 2011-12-20 MED ORDER — PANTOPRAZOLE SODIUM 40 MG PO TBEC
40.0000 mg | DELAYED_RELEASE_TABLET | Freq: Every day | ORAL | Status: DC
  Administered 2011-12-20 – 2011-12-23 (×4): 40 mg via ORAL
  Filled 2011-12-20 (×4): qty 1

## 2011-12-20 MED ORDER — POTASSIUM CHLORIDE IN NACL 20-0.9 MEQ/L-% IV SOLN
INTRAVENOUS | Status: DC
  Administered 2011-12-20: 13:00:00 via INTRAVENOUS
  Filled 2011-12-20 (×6): qty 1000

## 2011-12-20 MED ORDER — ACETAMINOPHEN 325 MG PO TABS: 650.0000 mg | ORAL_TABLET | Freq: Four times a day (QID) | ORAL | Status: DC | PRN

## 2011-12-20 MED ORDER — WARFARIN - PHARMACIST DOSING INPATIENT: Freq: Every day | Status: DC

## 2011-12-20 MED ORDER — PHENYLEPHRINE HCL 10 MG/ML IJ SOLN
INTRAMUSCULAR | Status: DC | PRN
  Administered 2011-12-20: 80 ug via INTRAVENOUS
  Administered 2011-12-20: 120 ug via INTRAVENOUS

## 2011-12-20 MED ORDER — LACTATED RINGERS IV SOLN
INTRAVENOUS | Status: DC | PRN
  Administered 2011-12-20: 07:00:00 via INTRAVENOUS

## 2011-12-20 MED ORDER — POLYETHYLENE GLYCOL 3350 17 G PO PACK
17.0000 g | PACK | Freq: Every day | ORAL | Status: DC
  Administered 2011-12-20 – 2011-12-22 (×3): 17 g via ORAL
  Filled 2011-12-20 (×4): qty 1

## 2011-12-20 MED ORDER — VITAMIN C 500 MG PO TABS
1000.0000 mg | ORAL_TABLET | Freq: Every day | ORAL | Status: DC
  Administered 2011-12-20 – 2011-12-23 (×4): 1000 mg via ORAL
  Filled 2011-12-20 (×4): qty 2

## 2011-12-20 MED ORDER — OXYCODONE HCL 5 MG PO TABS
ORAL_TABLET | ORAL | Status: AC
  Filled 2011-12-20: qty 2

## 2011-12-20 MED ORDER — ONDANSETRON HCL 4 MG/2ML IJ SOLN
INTRAMUSCULAR | Status: DC | PRN
  Administered 2011-12-20: 4 mg via INTRAVENOUS

## 2011-12-20 MED ORDER — PROPOFOL 10 MG/ML IV BOLUS
INTRAVENOUS | Status: DC | PRN
  Administered 2011-12-20: 150 mg via INTRAVENOUS

## 2011-12-20 MED ORDER — HYDROMORPHONE HCL PF 1 MG/ML IJ SOLN
0.2500 mg | INTRAMUSCULAR | Status: DC | PRN
  Administered 2011-12-20 (×4): 0.5 mg via INTRAVENOUS

## 2011-12-20 MED ORDER — TIMOLOL HEMIHYDRATE 0.5 % OP SOLN: 1.0000 [drp] | Freq: Every day | OPHTHALMIC | Status: DC

## 2011-12-20 MED ORDER — METHOCARBAMOL 500 MG PO TABS
500.0000 mg | ORAL_TABLET | Freq: Four times a day (QID) | ORAL | Status: DC | PRN
  Administered 2011-12-21 – 2011-12-23 (×6): 500 mg via ORAL
  Filled 2011-12-20 (×7): qty 1

## 2011-12-20 MED ORDER — EPHEDRINE SULFATE 50 MG/ML IJ SOLN
INTRAMUSCULAR | Status: DC | PRN
  Administered 2011-12-20 (×2): 10 mg via INTRAVENOUS

## 2011-12-20 MED ORDER — METHOCARBAMOL 100 MG/ML IJ SOLN
500.0000 mg | Freq: Four times a day (QID) | INTRAVENOUS | Status: DC | PRN
  Administered 2011-12-20: 500 mg via INTRAVENOUS
  Filled 2011-12-20: qty 5

## 2011-12-20 MED ORDER — SODIUM CHLORIDE 0.9 % IR SOLN
Status: DC | PRN
  Administered 2011-12-20: 1000 mL
  Administered 2011-12-20: 3000 mL

## 2011-12-20 MED ORDER — HYDROMORPHONE HCL PF 1 MG/ML IJ SOLN: 1.0000 mg | INTRAMUSCULAR | Status: DC | PRN

## 2011-12-20 MED ORDER — VANCOMYCIN HCL IN DEXTROSE 1-5 GM/200ML-% IV SOLN
1000.0000 mg | Freq: Two times a day (BID) | INTRAVENOUS | Status: AC
  Administered 2011-12-21: 1000 mg via INTRAVENOUS
  Filled 2011-12-20: qty 200

## 2011-12-20 MED ORDER — GUAIFENESIN ER 600 MG PO TB12
600.0000 mg | ORAL_TABLET | Freq: Every day | ORAL | Status: DC | PRN
  Filled 2011-12-20: qty 1

## 2011-12-20 MED ORDER — PHENOL 1.4 % MT LIQD: 1.0000 | OROMUCOSAL | Status: DC | PRN

## 2011-12-20 MED ORDER — LORATADINE 10 MG PO TABS
10.0000 mg | ORAL_TABLET | Freq: Every day | ORAL | Status: DC
  Administered 2011-12-20 – 2011-12-23 (×4): 10 mg via ORAL
  Filled 2011-12-20 (×4): qty 1

## 2011-12-20 MED ORDER — FENTANYL CITRATE 0.05 MG/ML IJ SOLN
INTRAMUSCULAR | Status: DC | PRN
  Administered 2011-12-20 (×4): 50 ug via INTRAVENOUS

## 2011-12-20 MED ORDER — OXYCODONE-ACETAMINOPHEN 5-325 MG PO TABS: 1.0000 | ORAL_TABLET | ORAL | Status: AC | PRN

## 2011-12-20 MED ORDER — TIMOLOL MALEATE 0.5 % OP SOLN
1.0000 [drp] | Freq: Every day | OPHTHALMIC | Status: DC
  Administered 2011-12-20 – 2011-12-23 (×4): 1 [drp] via OPHTHALMIC
  Filled 2011-12-20: qty 5

## 2011-12-20 MED ORDER — CALCIUM CITRATE 950 (200 CA) MG PO TABS
200.0000 mg | ORAL_TABLET | Freq: Two times a day (BID) | ORAL | Status: DC
  Administered 2011-12-20 – 2011-12-23 (×6): 200 mg via ORAL
  Filled 2011-12-20 (×9): qty 1

## 2011-12-20 MED ORDER — SERTRALINE HCL 100 MG PO TABS
100.0000 mg | ORAL_TABLET | Freq: Every day | ORAL | Status: DC
  Administered 2011-12-21 – 2011-12-23 (×3): 100 mg via ORAL
  Filled 2011-12-20 (×3): qty 1

## 2011-12-20 MED ORDER — MENTHOL 3 MG MT LOZG: 1.0000 | LOZENGE | OROMUCOSAL | Status: DC | PRN

## 2011-12-20 MED ORDER — VITAMIN D3 25 MCG (1000 UNIT) PO TABS
2000.0000 [IU] | ORAL_TABLET | Freq: Every day | ORAL | Status: DC
  Administered 2011-12-20 – 2011-12-23 (×4): 2000 [IU] via ORAL
  Filled 2011-12-20 (×4): qty 2

## 2011-12-20 MED ORDER — PROPRANOLOL HCL 20 MG PO TABS
20.0000 mg | ORAL_TABLET | Freq: Every day | ORAL | Status: DC
Start: 2011-12-21 — End: 2011-12-23
  Administered 2011-12-22 – 2011-12-23 (×2): 20 mg via ORAL
  Filled 2011-12-20 (×3): qty 1

## 2011-12-20 MED ORDER — MIDAZOLAM HCL 5 MG/5ML IJ SOLN
INTRAMUSCULAR | Status: DC | PRN
  Administered 2011-12-20: 1 mg via INTRAVENOUS

## 2011-12-20 MED ORDER — DOCUSATE SODIUM 100 MG PO CAPS
300.0000 mg | ORAL_CAPSULE | Freq: Every day | ORAL | Status: DC
  Administered 2011-12-20 – 2011-12-22 (×3): 300 mg via ORAL
  Filled 2011-12-20 (×3): qty 3
  Filled 2011-12-20: qty 1

## 2011-12-20 MED ORDER — SIMVASTATIN 20 MG PO TABS
20.0000 mg | ORAL_TABLET | ORAL | Status: DC
  Administered 2011-12-21 – 2011-12-23 (×2): 20 mg via ORAL
  Filled 2011-12-20 (×2): qty 1

## 2011-12-20 MED ORDER — LIDOCAINE HCL (CARDIAC) 20 MG/ML IV SOLN
INTRAVENOUS | Status: DC | PRN
  Administered 2011-12-20: 20 mg via INTRAVENOUS

## 2011-12-20 MED ORDER — BISACODYL 10 MG RE SUPP: 10.0000 mg | Freq: Every day | RECTAL | Status: DC | PRN

## 2011-12-20 SURGICAL SUPPLY — 65 items
BANDAGE ELASTIC 4 VELCRO ST LF (GAUZE/BANDAGES/DRESSINGS) ×2 IMPLANT
BANDAGE ESMARK 6X9 LF (GAUZE/BANDAGES/DRESSINGS) ×1 IMPLANT
BANDAGE GAUZE ELAST BULKY 4 IN (GAUZE/BANDAGES/DRESSINGS) ×4 IMPLANT
BLADE SAG 18X100X1.27 (BLADE) ×2 IMPLANT
BLADE SAW SGTL 13.0X1.19X90.0M (BLADE) ×2 IMPLANT
BNDG CMPR 9X6 STRL LF SNTH (GAUZE/BANDAGES/DRESSINGS) ×1
BNDG CMPR MED 10X6 ELC LF (GAUZE/BANDAGES/DRESSINGS) ×1
BNDG ELASTIC 6X10 VLCR STRL LF (GAUZE/BANDAGES/DRESSINGS) ×2 IMPLANT
BNDG ESMARK 6X9 LF (GAUZE/BANDAGES/DRESSINGS) ×2
BOWL SMART MIX CTS (DISPOSABLE) ×2 IMPLANT
CEMENT HV SMART SET (Cement) ×4 IMPLANT
CLOTH BEACON ORANGE TIMEOUT ST (SAFETY) ×2 IMPLANT
CLSR STERI-STRIP ANTIMIC 1/2X4 (GAUZE/BANDAGES/DRESSINGS) ×1 IMPLANT
COVER BACK TABLE 24X17X13 BIG (DRAPES) IMPLANT
COVER SURGICAL LIGHT HANDLE (MISCELLANEOUS) ×2 IMPLANT
CUFF TOURNIQUET SINGLE 34IN LL (TOURNIQUET CUFF) ×1 IMPLANT
CUFF TOURNIQUET SINGLE 44IN (TOURNIQUET CUFF) IMPLANT
DRAPE EXTREMITY T 121X128X90 (DRAPE) ×2 IMPLANT
DRAPE PROXIMA HALF (DRAPES) ×2 IMPLANT
DRAPE U-SHAPE 47X51 STRL (DRAPES) ×2 IMPLANT
DRSG ADAPTIC 3X8 NADH LF (GAUZE/BANDAGES/DRESSINGS) ×2 IMPLANT
DRSG PAD ABDOMINAL 8X10 ST (GAUZE/BANDAGES/DRESSINGS) ×4 IMPLANT
DURAPREP 26ML APPLICATOR (WOUND CARE) ×2 IMPLANT
ELECT CAUTERY BLADE 6.4 (BLADE) ×2 IMPLANT
ELECT REM PT RETURN 9FT ADLT (ELECTROSURGICAL) ×2
ELECTRODE REM PT RTRN 9FT ADLT (ELECTROSURGICAL) ×1 IMPLANT
FACESHIELD LNG OPTICON STERILE (SAFETY) ×6 IMPLANT
GLOVE BIOGEL PI IND STRL 6.5 (GLOVE) IMPLANT
GLOVE BIOGEL PI INDICATOR 6.5 (GLOVE) ×1
GLOVE BIOGEL PI ORTHO PRO 7.5 (GLOVE) ×1
GLOVE BIOGEL PI ORTHO PRO SZ7 (GLOVE) ×1
GLOVE BIOGEL PI ORTHO PRO SZ8 (GLOVE) ×1
GLOVE ECLIPSE 6.5 STRL STRAW (GLOVE) ×1 IMPLANT
GLOVE ORTHO TXT STRL SZ7.5 (GLOVE) ×2 IMPLANT
GLOVE PI ORTHO PRO STRL 7.5 (GLOVE) ×1 IMPLANT
GLOVE PI ORTHO PRO STRL SZ7 (GLOVE) IMPLANT
GLOVE PI ORTHO PRO STRL SZ8 (GLOVE) ×1 IMPLANT
GLOVE SURG ORTHO 8.5 STRL (GLOVE) ×2 IMPLANT
GLOVE SURG SS PI 7.0 STRL IVOR (GLOVE) ×1 IMPLANT
GOWN STRL NON-REIN LRG LVL3 (GOWN DISPOSABLE) ×2 IMPLANT
GOWN STRL REIN XL XLG (GOWN DISPOSABLE) ×5 IMPLANT
HANDPIECE INTERPULSE COAX TIP (DISPOSABLE) ×2
IMMOBILIZER KNEE 22 UNIV (SOFTGOODS) ×1 IMPLANT
KIT BASIN OR (CUSTOM PROCEDURE TRAY) ×2 IMPLANT
KIT MANIFOLD (MISCELLANEOUS) ×2 IMPLANT
KIT ROOM TURNOVER OR (KITS) ×2 IMPLANT
MANIFOLD NEPTUNE II (INSTRUMENTS) ×2 IMPLANT
NS IRRIG 1000ML POUR BTL (IV SOLUTION) ×2 IMPLANT
PACK TOTAL JOINT (CUSTOM PROCEDURE TRAY) ×2 IMPLANT
PAD ARMBOARD 7.5X6 YLW CONV (MISCELLANEOUS) ×4 IMPLANT
SET HNDPC FAN SPRY TIP SCT (DISPOSABLE) ×1 IMPLANT
SPONGE GAUZE 4X4 12PLY (GAUZE/BANDAGES/DRESSINGS) ×2 IMPLANT
STRIP CLOSURE SKIN 1/2X4 (GAUZE/BANDAGES/DRESSINGS) ×3 IMPLANT
SUCTION FRAZIER TIP 10 FR DISP (SUCTIONS) ×2 IMPLANT
SUT MNCRL AB 3-0 PS2 18 (SUTURE) ×2 IMPLANT
SUT VIC AB 0 CT1 27 (SUTURE) ×4
SUT VIC AB 0 CT1 27XBRD ANBCTR (SUTURE) ×2 IMPLANT
SUT VIC AB 1 CT1 27 (SUTURE) ×4
SUT VIC AB 1 CT1 27XBRD ANBCTR (SUTURE) ×2 IMPLANT
SUT VIC AB 2-0 CT1 27 (SUTURE) ×4
SUT VIC AB 2-0 CT1 TAPERPNT 27 (SUTURE) ×2 IMPLANT
TOWEL OR 17X24 6PK STRL BLUE (TOWEL DISPOSABLE) ×2 IMPLANT
TOWEL OR 17X26 10 PK STRL BLUE (TOWEL DISPOSABLE) ×2 IMPLANT
TRAY FOLEY CATH 14FR (SET/KITS/TRAYS/PACK) ×1 IMPLANT
WATER STERILE IRR 1000ML POUR (IV SOLUTION) ×6 IMPLANT

## 2011-12-20 NOTE — Interval H&P Note (Signed)
History and Physical Interval Note:  12/20/2011 7:34 AM  [name]  has presented today for surgery, with the diagnosis of right knee OA  The various methods of treatment have been discussed with the patient and family. After consideration of risks, benefits and other options for treatment, the patient has consented to  Procedure(s) (LRB): TOTAL KNEE ARTHROPLASTY (Right) as a surgical intervention .  The patient's history has been reviewed, patient examined, no change in status, stable for surgery.  I have reviewed the patients' chart and labs.  Questions were answered to the patient's satisfaction.     Chantel Teti,STEVEN R

## 2011-12-20 NOTE — Discharge Summary (Signed)
Physician Discharge Summary  Patient ID: [name] MRN: [id number] DOB/AGE: 08-May-1941 71 y.o.  Admit date: 12/20/2011 Discharge date: 12/20/2011  Admission Diagnoses:  Principal Problem:  *Osteoarthrosis, unspecified whether generalized or localized, lower leg   Discharge Diagnoses:  Same   Surgeries: Procedure(s): TOTAL KNEE ARTHROPLASTY on 12/20/2011   Consultants: Physical Therapy  Discharged Condition: Stable  Hospital Course: [name] is an 71 y.o. female who was admitted 12/20/2011 with a chief complaint of knee pain, and found to have a diagnosis of Osteoarthrosis, unspecified whether generalized or localized, lower leg.  They were brought to the operating room on 12/20/2011 and underwent the above named procedures.    The patient had an uncomplicated hospital course and was stable for discharge.  Recent vital signs:  Filed Vitals:   12/20/11 0554  BP: 143/79  Pulse: 49  Temp: 98.2 F (36.8 C)  Resp: 18    Recent laboratory studies:  Results for orders placed during the hospital encounter of 12/11/11  BASIC METABOLIC PANEL      Component Value Range   Sodium 144  135 - 145 mEq/L   Potassium 4.3  3.5 - 5.1 mEq/L   Chloride 105  96 - 112 mEq/L   CO2 30  19 - 32 mEq/L   Glucose, Bld 101 (*) 70 - 99 mg/dL   BUN 9  6 - 23 mg/dL   Creatinine, Ser 6.96  0.50 - 1.10 mg/dL   Calcium 9.9  8.4 - 29.5 mg/dL   GFR calc non Af Amer >90  >90 mL/min   GFR calc Af Amer >90  >90 mL/min  SURGICAL PCR SCREEN      Component Value Range   MRSA, PCR NEGATIVE  NEGATIVE   Staphylococcus aureus NEGATIVE  NEGATIVE  CBC      Component Value Range   WBC 5.9  4.0 - 10.5 K/uL   RBC 4.49  3.87 - 5.11 MIL/uL   Hemoglobin 14.1  12.0 - 15.0 g/dL   HCT 28.4  13.2 - 44.0 %   MCV 93.8  78.0 - 100.0 fL   MCH 31.4  26.0 - 34.0 pg   MCHC 33.5  30.0 - 36.0 g/dL   RDW 10.2  72.5 - 36.6 %   Platelets 160  150 - 400 K/uL  PROTIME-INR      Component Value Range   Prothrombin Time  13.5  11.6 - 15.2 seconds   INR 1.01  0.00 - 1.49  APTT      Component Value Range   aPTT 29  24 - 37 seconds  TYPE AND SCREEN      Component Value Range   ABO/RH(D) O POS     Antibody Screen NEG     Sample Expiration 12/25/2011      Discharge Medications:   Medication List  As of 12/20/2011  7:40 AM   ASK your doctor about these medications         albuterol 108 (90 BASE) MCG/ACT inhaler   Commonly known as: PROVENTIL HFA;VENTOLIN HFA   Inhale 2 puffs into the lungs daily as needed. For seasonal allergies      ALPRAZolam 0.25 MG tablet   Commonly known as: XANAX   Take 0.25 mg by mouth as needed. For anxiety      amoxicillin 500 MG capsule   Commonly known as: AMOXIL   Take 2,000 mg by mouth See admin instructions. Before a dental visit      CALCIUM CITRATE +  PO   Take 1,000 mg by mouth daily.      cholecalciferol 1000 UNITS tablet   Commonly known as: VITAMIN D   Take 2,000 Units by mouth daily.      docusate sodium 100 MG capsule   Commonly known as: COLACE   Take 300 mg by mouth at bedtime.      guaiFENesin 600 MG 12 hr tablet   Commonly known as: MUCINEX   Take 600 mg by mouth daily as needed. For allergies during allergy season      HYDROcodone-acetaminophen 5-325 MG per tablet   Commonly known as: NORCO   Take 1-2 tablets by mouth every 6 (six) hours as needed. For pain      loratadine 10 MG tablet   Commonly known as: CLARITIN   Take 10 mg by mouth daily.      multivitamin with minerals Tabs   Take 1 tablet by mouth daily.      omeprazole 20 MG capsule   Commonly known as: PRILOSEC   Take 20 mg by mouth daily.      polyethylene glycol packet   Commonly known as: MIRALAX / GLYCOLAX   Take 17 g by mouth daily.      propranolol 20 MG tablet   Commonly known as: INDERAL   Take 20 mg by mouth daily.      sertraline 100 MG tablet   Commonly known as: ZOLOFT   Take 100 mg by mouth daily.      simvastatin 40 MG tablet   Commonly known as: ZOCOR    Take 20 mg by mouth every other day.      timolol 0.5 % ophthalmic solution   Commonly known as: BETIMOL   Place 1 drop into both eyes daily.      vitamin C 500 MG tablet   Commonly known as: ASCORBIC ACID   Take 1,000 mg by mouth daily.            Diagnostic Studies: Dg Chest 2 View  12/11/2011  *RADIOLOGY REPORT*  Clinical Data: Preop right knee arthroplasty  CHEST - 2 VIEW  Comparison: 07/10/2007  Findings: Cardiomediastinal silhouette is stable.  No acute infiltrate or pleural effusion.  No pulmonary edema. Stable mild degenerative changes thoracic spine.  IMPRESSION: No active disease.  No significant change.  Original Report Authenticated By: Natasha Mead, M.D.    Disposition:  d/c home with Home health PT, OT, nursing.  Follow up in 2 weeks with Dr [name]  Follow-up Information    Follow up with Taiwana Willison,STEVEN R, MD. Call in 2 weeks. [phone])    Contact information:   [facility] [address] [place] [address] [phone]           Signed: Verlee Rossetti 12/20/2011, 7:40 AM

## 2011-12-20 NOTE — Progress Notes (Signed)
Orthopedic Tech Progress Note Patient Details:  [name] 1941/06/05 [id number]  CPM Right Knee CPM Right Knee: On Right Knee Flexion (Degrees): 60  Right Knee Extension (Degrees): 0  Additional Comments: traeze bar   Cammer, Mickie Bail 12/20/2011, 11:43 AM

## 2011-12-20 NOTE — Op Note (Signed)
[name], [name]                 ACCOUNT NO.:  [id number]  MEDICAL RECORD NO.:  [id number]  LOCATION:  [facility]                         FACILITY:  [facility]  PHYSICIAN:  [name]. [name], M.D. DATE OF BIRTH:  1940/07/06  DATE OF PROCEDURE:  12/20/2011 DATE OF DISCHARGE:                              OPERATIVE REPORT   PREOPERATIVE DIAGNOSIS:  Right knee end-stage osteoarthritis.  POSTOPERATIVE DIAGNOSIS:  Right knee end-stage osteoarthritis.  PROCEDURE PERFORMED:  Right knee total knee replacement.  ATTENDING SURGEON:  [name]. Betty Patrick, MD  ASSISTANT:  Donnie Coffin. Dixon, PA-C  ANESTHESIA:  General anesthesia was used.  ESTIMATED BLOOD LOSS:  Minimal.  FLUID REPLACEMENT:  crystalloid.  INSTRUMENT COUNTS:  Correct.  COMPLICATIONS:  No complications.  Perioperative antibiotics were given.  INDICATIONS:  The patient is a 71 year old female with worsening right knee pain secondary to arthritis.  The patient has chronic dysfunction and progressive pain and desires total knee arthroplasty.  She is status post total knee on the left and has done well with that.  Risks and benefits were discussed, she elected to proceed.  Informed consent was obtained.  DESCRIPTION OF PROCEDURE:  After adequate level of anesthesia was achieved, the patient was positioned supine on the operative table. Right leg was correctly identified and a nonsterile tourniquet was placed on the proximal thigh.  We then placed a foot rest and then a lateral thigh positioner.  The left leg was padded appropriately and secured to the table.  After sterile prep and drape of the right knee, we performed a time-out.  We then exsanguinated the right limb using Esmarch bandage.  We elevated the tourniquet to 325 mmHg.  With the knee in flexion, a longitudinal midline incision was created, dissection down through the subcutaneous tissues with a fresh #10 blade and a medium parapatellar arthrotomy was created.  We  everted the patella, divided the lateral patellofemoral ligaments.  We entered the distal femur with step-cut drill.  We then placed a intramedullary resection guide, 10 mm, 5 degrees right.  We resected the distal femur.  We then  sized to 2.5 anterior down.  We resected anterior, posterior, and chamfer cuts with a formalin block.  Next, we resected ACL and PCL meniscal tissue.  We subluxed tibia anteriorly, performed our tibial cut using external alignment jig, resecting 2 mm off the affected medial side.  We performed a little bit of a medial soft tissue release, released with the laminar spreader the posterior capsule and to remove some bone off the posterior aspect of the femur.  We were able to check our gaps.  We were symmetric at 10 mm both in flexion and extension, little tighter on the medial side as expected with this knee wear pattern.  Next, we thoroughly irrigated.  We then finished our tibial preparation with the modular drill and keel punch for the 2.5 tibia.  We also then went ahead and completed our femoral preparation with the box cut guide and resected for the posterior cruciate substituting DePuy prosthesis. Next, we inserted the 2.5 right posterior stabilized DePuy femur, impacted that in place, trialed with a size first 10 and then 12.5.  We were happy with alignment, soft tissue balance.  We then resurfaced the patella going from a 22-mm thickness down to 15, placing a 32 patellar button in place after drilling the lug holes.  We ranged the knee fully. No patellar tracking problems were noted.  We then removed all trial components, pulse irrigated the knee, and cemented the components into place using vacuum mixing techniques with DePuy high-viscosity cement. We cemented a 2.5 tibia, 2.5 femur and placed 12.5 poly insert in place. We were easily able to reduce the knee and get the knee into full extension.  We then cemented the patella into place.  Once the  cement was allowed to harden, we removed excess cement using quarter-inch curved osteotome.  We inspected the posterior aspect of the knee and around the tibia and all around the femur.  We then tried a trial at 15, 15 would not go in, so we selected a size 12.5 and then placed the real poly insert in and reduced the knee, again happy with full extension, soft tissue balancing, excellent full flexion.  No liftoff and no instability with flexion.  We repaired the parapatellar arthrotomy after thorough pulse irrigation with saline.  We repaired that with #1 Vicryl suture interrupted figure-of-eight suture followed by 2-0 Vicryl for subcutaneous closure and 4-0 Monocryl for skin.  Steri-Strips and sterile compressive bandage were applied.  The patient tolerated the surgery well.     [name]. [name], M.D.     SRN/MEDQ  D:  12/20/2011  T:  12/20/2011  Job:  [id number]

## 2011-12-20 NOTE — Brief Op Note (Signed)
12/20/2011  9:41 AM  PATIENT:  [name]  71 y.o. female  PRE-OPERATIVE DIAGNOSIS:  right knee endstage osteoarthritis  POST-OPERATIVE DIAGNOSIS:  right knee endstage osteoarthritis  PROCEDURE:  Procedure(s) (LRB): TOTAL KNEE ARTHROPLASTY (Right), Zerita Boers Sigma RP  SURGEON:  Surgeon(s) and Role:    * Verlee Rossetti, MD - Primary  PHYSICIAN ASSISTANT:   ASSISTANTS: Thea Gist, PA-C   ANESTHESIA:   general  EBL:  Total I/O In: 1000 [I.V.:1000] Out: 150 [Urine:150]  BLOOD ADMINISTERED:none  DRAINS: none   LOCAL MEDICATIONS USED:  NONE  SPECIMEN:  No Specimen  DISPOSITION OF SPECIMEN:  N/A  COUNTS:  YES  TOURNIQUET:  * Missing tourniquet times found for documented tourniquets in log:  16109 *  DICTATION: .Other Dictation: Dictation Number [id number]  PLAN OF CARE: Admit to inpatient   PATIENT DISPOSITION:  PACU - hemodynamically stable.   Delay start of Pharmacological VTE agent (>24hrs) due to surgical blood loss or risk of bleeding: no

## 2011-12-20 NOTE — Discharge Instructions (Signed)
Elevate right leg at all times. Ice to the right knee. Do exercises every hour. Dangle knee over bed. DO NOT PROP ANYTHING BEHIND THE KNEE. Weight Bearing as Tolerated with walker. Keep incision clean, dry for 7 days.  Follow up in two weeks [phone]

## 2011-12-20 NOTE — Anesthesia Preprocedure Evaluation (Addendum)
Anesthesia Evaluation  Patient identified by MRN, date of birth, ID band Patient awake    Reviewed: Allergy & Precautions, H&P , NPO status , Patient's Chart, lab work & pertinent test results  Airway Mallampati: II  Neck ROM: full    Dental  (+) Teeth Intact and Dental Advisory Given   Pulmonary former smoker         Cardiovascular     Neuro/Psych Anxiety    GI/Hepatic GERD-  ,  Endo/Other  obese  Renal/GU      Musculoskeletal   Abdominal   Peds  Hematology   Anesthesia Other Findings   Reproductive/Obstetrics                          Anesthesia Physical Anesthesia Plan  ASA: II  Anesthesia Plan: General and Regional   Post-op Pain Management: MAC Combined w/ Regional for Post-op pain   Induction: Intravenous  Airway Management Planned: LMA  Additional Equipment:   Intra-op Plan:   Post-operative Plan:   Informed Consent: I have reviewed the patients History and Physical, chart, labs and discussed the procedure including the risks, benefits and alternatives for the proposed anesthesia with the patient or authorized representative who has indicated his/her understanding and acceptance.     Plan Discussed with: CRNA and Surgeon  Anesthesia Plan Comments:         Anesthesia Quick Evaluation

## 2011-12-20 NOTE — Progress Notes (Signed)
ANTICOAGULATION CONSULT NOTE - Initial Consult  Pharmacy Consult for Warfarin Indication: VTE prophylaxis s/p R TKA  Allergies  Allergen Reactions  . Cefadroxil Hives    Patient can take amoxicillin and cipro  . Morphine And Related Itching    Patient Measurements:   Total body Wt 83.6kg  Vital Signs: Temp: 98.2 F (36.8 C) (06/21 1100) Temp src: Oral (06/21 0554) BP: 104/54 mmHg (06/21 1102) Pulse Rate: 68  (06/21 1102)  Labs: No results found for this basename: HGB:2,HCT:3,PLT:3,APTT:3,LABPROT:3,INR:3,HEPARINUNFRC:3,CREATININE:3,CKTOTAL:3,CKMB:3,TROPONINI:3 in the last 72 hours  CrCl is unknown because there is no height on file for the current visit.   Medical History: Past Medical History  Diagnosis Date  . Anxiety   . GERD (gastroesophageal reflux disease)   . Hyperlipemia   . Seasonal allergies     [name]  Coumadin will be started for VTE prophylaxis.  Baseline CBC stable.  Baseline INR 1.01.  Goal of Therapy:  INR 2-3 Monitor platelets by anticoagulation protocol: Yes   Plan:  Coumadin 5mg  x1 Daily INR  [name] 12/20/2011,1:04 PM

## 2011-12-20 NOTE — Preoperative (Signed)
Beta Blockers   Reason not to administer Beta Blockers:Not Applicable 

## 2011-12-20 NOTE — Anesthesia Postprocedure Evaluation (Signed)
Anesthesia Post Note  Patient: [name]  Procedure(s) Performed: Procedure(s) (LRB): TOTAL KNEE ARTHROPLASTY (Right)  Anesthesia type: General  Patient location: PACU  Post pain: Pain level controlled and Adequate analgesia  Post assessment: Post-op Vital signs reviewed, Patient's Cardiovascular Status Stable, Respiratory Function Stable, Patent Airway and Pain level controlled  Last Vitals:  Filed Vitals:   12/20/11 1047  BP: 118/61  Pulse: 72  Temp:   Resp: 11    Post vital signs: Reviewed and stable  Level of consciousness: awake, alert  and oriented  Complications: No apparent anesthesia complications

## 2011-12-20 NOTE — Anesthesia Procedure Notes (Addendum)
Procedure Name: LMA Insertion Date/Time: 12/20/2011 7:49 AM Performed by: Sharlene Dory E Pre-anesthesia Checklist: Patient identified, Emergency Drugs available, Suction available, Patient being monitored and Timeout performed Patient Re-evaluated:Patient Re-evaluated prior to inductionOxygen Delivery Method: Circle system utilized Preoxygenation: Pre-oxygenation with 100% oxygen Intubation Type: IV induction LMA: LMA inserted LMA Size: 4.0 Number of attempts: 1 Placement Confirmation: positive ETCO2 and breath sounds checked- equal and bilateral Tube secured with: Tape Dental Injury: Teeth and Oropharynx as per pre-operative assessment

## 2011-12-20 NOTE — Transfer of Care (Signed)
Immediate Anesthesia Transfer of Care Note  Patient: [name]  Procedure(s) Performed: Procedure(s) (LRB): TOTAL KNEE ARTHROPLASTY (Right)  Patient Location: PACU  Anesthesia Type: General  Level of Consciousness: awake, alert  and oriented  Airway & Oxygen Therapy: Patient Spontanous Breathing and Patient connected to nasal cannula oxygen  Post-op Assessment: Report given to PACU RN, Post -op Vital signs reviewed and stable and Patient moving all extremities X 4  Post vital signs: Reviewed and stable  Complications: No apparent anesthesia complications

## 2011-12-20 NOTE — Care Management Note (Signed)
    Page 1 of 2   12/23/2011     11:36:31 AM   CARE MANAGEMENT NOTE 12/23/2011  Patient:  [name], [name]   Account Number:  [id number]  Date Initiated:  12/20/2011  Documentation initiated by:  [facility]  Subjective/Objective Assessment:   ADmitted postop rt total knee replacement.     Action/Plan:   HHPT, HHOT, HHRN   Anticipated DC Date:  12/23/2011   Anticipated DC Plan:  HOME W HOME HEALTH SERVICES      DC Planning Services  CM consult      Choice offered to / List presented to:  C-1 Patient   DME arranged  3-N-1  Levan Hurst      DME agency  [facility]     [facility] arranged  HH-1 RN  HH-2 PT  HH-3 OT      [facility] agency  [facility]   Status of service:  Completed, signed off Medicare Important Message given?   (If response is "NO", the following Medicare IM given date fields will be blank) Date Medicare IM given:   Date Additional Medicare IM given:    Discharge Disposition:  HOME W HOME HEALTH SERVICES  Per UR Regulation:  Reviewed for med. necessity/level of care/duration of stay  If discussed at Long Length of Stay Meetings, dates discussed:    Comments:  12/23/11  11:34  Anette Guarneri RN/CM Notified Genevieve Norlander of patient's pending discharge, confirmed with patient that she has 3n1 and RW.    12/20/11 Spoke with patient about HHC for ordered [facility], PT and OT. She is set up with [facility].Contacted Debbie at [facility] and verified that patient is with [place] for [facility] services and DME.   Jacquelynn Cree RN, BSN, CCM

## 2011-12-21 LAB — BASIC METABOLIC PANEL
Chloride: 106 mEq/L (ref 96–112)
GFR calc non Af Amer: 86 mL/min — ABNORMAL LOW (ref >90)
Glucose, Bld: 115 mg/dL — ABNORMAL HIGH (ref 70–99)
Potassium: 3.9 mEq/L (ref 3.5–5.1)
Sodium: 143 mEq/L (ref 135–145)

## 2011-12-21 LAB — CBC
Hemoglobin: 11.5 g/dL — ABNORMAL LOW (ref 12.0–15.0)
MCHC: 32.3 g/dL (ref 30.0–36.0)
RBC: 3.72 MIL/uL — ABNORMAL LOW (ref 3.87–5.11)
WBC: 6.9 10*3/uL (ref 4.0–10.5)

## 2011-12-21 LAB — PROTIME-INR
INR: 1.26 (ref 0.00–1.49)
Prothrombin Time: 16.1 seconds — ABNORMAL HIGH (ref 11.6–15.2)

## 2011-12-21 MED ORDER — WARFARIN SODIUM 5 MG PO TABS
5.0000 mg | ORAL_TABLET | Freq: Once | ORAL | Status: AC
  Administered 2011-12-21: 5 mg via ORAL
  Filled 2011-12-21: qty 1

## 2011-12-21 NOTE — Progress Notes (Signed)
Physical Therapy Treatment Note   12/21/11 1347  PT Visit Information  Last PT Received On 12/21/11  Assistance Needed +1  PT Time Calculation  PT Start Time 1347  PT Stop Time 1359  PT Time Calculation (min) 12 min  Subjective Data  Subjective Pt received sitting up in chair agreeable to ambulate. Patient with report of completing HEP x2.  Precautions  Precautions Knee  Required Braces or Orthoses Knee Immobilizer - Right  Knee Immobilizer - Right On when out of bed or walking  Restrictions  RLE Weight Bearing WBAT  Cognition  Overall Cognitive Status Appears within functional limits for tasks assessed/performed  Arousal/Alertness Awake/alert  Orientation Level Oriented X4 / Intact;Appears intact for tasks assessed  Behavior During Session [facility] for tasks performed  Transfers  Transfers Sit to Stand;Stand to Sit  Sit to Stand 4: Min assist;With upper extremity assist;With armrests;From chair/3-in-1  Stand to Sit 4: Min assist;With upper extremity assist;To chair/3-in-1;To toilet  Details for Transfer Assistance v/c's for hand placement and R LE management  Ambulation/Gait  Ambulation/Gait Assistance 4: Min assist  Ambulation Distance (Feet) 60 Feet  Assistive device Rolling walker  Ambulation/Gait Assistance Details max encouragement to increase R LE WBing, max directional v/c's for sequencing to WB thru flat foot not ball of R foot  Gait Pattern Step-to pattern;Decreased step length - right;Decreased stance time - right;Antalgic  Gait velocity slow  Stairs No  Exercises  Exercises Total Joint  Total Joint Exercises  Heel Slides AAROM;Right;10 reps;Seated  Long Arc Quad AROM;Right;10 reps;Supine (pt able to initiate LAQ)  PT - End of Session  Equipment Utilized During Treatment Gait belt;Right knee immobilizer  Activity Tolerance Patient tolerated treatment well  Patient left in chair;with call bell/phone within reach;with family/visitor present  Nurse Communication  Mobility status  PT - Assessment/Plan  Comments on Treatment Session Pt with improved ambulation tolerance however remains to have decreased R LE WBing tolerance. Patient with improved R knee active flexion.  PT Plan Discharge plan remains appropriate;Frequency remains appropriate  PT Frequency 7X/week  Follow Up Recommendations Home health PT;Supervision/Assistance - 24 hour  Equipment Recommended None recommended by PT  Acute Rehab PT Goals  PT Goal: Sit to Stand - Progress Progressing toward goal  PT Goal: Ambulate - Progress Progressing toward goal  PT Goal: Perform Home Exercise Program - Progress Progressing toward goal  PT General Charges  $$ ACUTE PT VISIT 1 Procedure  PT Treatments  $Gait Training 8-22 mins    Pain: 6/10 R knee pain   Lewis Shock, PT, DPT Pager #: [phone] Office #: [phone]

## 2011-12-21 NOTE — Progress Notes (Signed)
ANTICOAGULATION CONSULT NOTE - Follow UP Consult  Pharmacy Consult for Warfarin Indication: VTE prophylaxis s/p R TKA  Allergies  Allergen Reactions  . Cefadroxil Hives    Patient can take amoxicillin and cipro  . Morphine And Related Itching    Patient Measurements:   Total body Wt 83.6kg  Vital Signs: Temp: 98.8 F (37.1 C) (06/22 0518) BP: 102/45 mmHg (06/22 0518) Pulse Rate: 76  (06/22 0518)  Labs:  Basename 12/21/11 0540  HGB 11.5*  HCT 35.6*  PLT 112*  APTT --  LABPROT 16.1*  INR 1.26  HEPARINUNFRC --  CREATININE 0.64  CKTOTAL --  CKMB --  TROPONINI --    CrCl is unknown because there is no height on file for the current visit.   Medical History: Past Medical History  Diagnosis Date  . Anxiety   . GERD (gastroesophageal reflux disease)   . Hyperlipemia   . Seasonal allergies     [name]  Coumadin started for VTE prophylaxis. CBC stable no bleeding noted.  INR 1.01>> 1.26  Goal of Therapy:  INR 2-3 Monitor platelets by anticoagulation protocol: Yes   Plan:  Coumadin 5mg  x1 Daily INR  [name] 12/21/2011,11:51 AM

## 2011-12-21 NOTE — Evaluation (Signed)
Occupational Therapy Evaluation Patient Details Name: [name] MRN: [id number] DOB: January 31, 1941 Today's Date: 12/21/2011 Time:  -     OT Assessment / Plan / Recommendation Clinical Impression  Pt demos decline in function with balance, safety, strength and activity tolerance for ADLs and ADL moiblity. Pt would benefit from skilled OT services to address these impairments to restore PLOF     OT Assessment  Patient needs continued OT Services    Follow Up Recommendations  Home health OT;Supervision/Assistance - 24 hour    Barriers to Discharge None Pt's spouse and sister in law will be at home with her and can provide assist  Equipment Recommendations  None recommended by OT    Recommendations for Other Services    Frequency  Min 2X/week    Precautions / Restrictions Precautions Precautions: Knee Required Braces or Orthoses: Knee Immobilizer - Right Knee Immobilizer - Right: On when out of bed or walking Restrictions Weight Bearing Restrictions: Yes RLE Weight Bearing: Weight bearing as tolerated       ADL  Eating/Feeding: Performed;Set up Where Assessed - Eating/Feeding: Chair Grooming: Performed;Wash/dry hands;Wash/dry face;Brushing hair;Minimal assistance Where Assessed - Grooming: Supported standing Upper Body Bathing: Simulated;Supervision/safety;Set up Where Assessed - Upper Body Bathing: Unsupported sitting Lower Body Bathing: Moderate assistance;Simulated Where Assessed - Lower Body Bathing: Unsupported sitting Upper Body Dressing: Performed;Set up;Supervision/safety Where Assessed - Upper Body Dressing: Unsupported sitting Lower Body Dressing: Moderate assistance (sock aide) Where Assessed - Lower Body Dressing: Unsupported sitting Toilet Transfer: Performed;Moderate assistance (verbal and physical cues to use grab bars/correct hand place) Toilet Transfer Method: Sit to stand Toilet Transfer Equipment: Raised toilet seat with arms (or 3-in-1 over  toilet) Toileting - Clothing Manipulation and Hygiene: Performed;Moderate assistance Where Assessed - Toileting Clothing Manipulation and Hygiene: Standing Tub/Shower Transfer: Performed;Min guard Tub/Shower Transfer Method: Science writer: Grab bars;Walk in shower Equipment Used: Gait belt;Knee Immobilizer;Sock aid;Rolling walker;Reacher;Long-handled sponge;Long-handled shoe horn    OT Diagnosis: Generalized weakness  OT Problem List: Decreased strength;Decreased activity tolerance;Impaired balance (sitting and/or standing);Decreased safety awareness;Decreased knowledge of use of DME or AE;Decreased knowledge of precautions;Pain;Increased edema OT Treatment Interventions: Self-care/ADL training;Therapeutic exercise;Neuromuscular education;DME and/or AE instruction;Therapeutic activities;Patient/family education;Balance training   OT Goals Acute Rehab OT Goals OT Goal Formulation: With patient Time For Goal Achievement: 12/28/11 Potential to Achieve Goals: Good ADL Goals Pt Will Perform Grooming: Standing at sink;with supervision;with set-up ADL Goal: Grooming - Progress: Goal set today Pt Will Perform Lower Body Bathing: with min assist;with adaptive equipment ADL Goal: Lower Body Bathing - Progress: Goal set today Pt Will Perform Lower Body Dressing: with min assist;with adaptive equipment ADL Goal: Lower Body Dressing - Progress: Goal set today Pt Will Transfer to Toilet: with supervision;with DME;Grab bars ADL Goal: Toilet Transfer - Progress: Goal set today Pt Will Perform Toileting - Clothing Manipulation: with supervision;Standing ADL Goal: Toileting - Clothing Manipulation - Progress: Goal set today Pt Will Perform Toileting - Hygiene: with supervision;Standing at 3-in-1/toilet;Sitting on 3-in-1 or toilet ADL Goal: Toileting - Hygiene - Progress: Goal set today  Visit Information  Last OT Received On: 12/21/11    Subjective Data  Subjective: " I  have been through this before " Patient Stated Goal: T o return home with husband   Prior Functioning       Cognition  Overall Cognitive Status: Appears within functional limits for tasks assessed/performed Arousal/Alertness: Awake/alert Orientation Level: Oriented X4 / Intact;Appears intact for tasks assessed Behavior During Session: [facility] for tasks performed    Extremity/Trunk  Assessment Right Upper Extremity Assessment RUE ROM/Strength/Tone: Within functional levels RUE Sensation: WFL - Light Touch RUE Coordination: WFL - gross/fine motor Left Upper Extremity Assessment LUE ROM/Strength/Tone: Within functional levels LUE Sensation: WFL - Light Touch LUE Coordination: WFL - gross/fine motor   Mobility Transfers Transfers: Sit to Stand Sit to Stand: 4: Min assist;With upper extremity assist;With armrests;From chair/3-in-1 Stand to Sit: 4: Min assist;With upper extremity assist;To chair/3-in-1;To toilet   Exercise    Balance    End of Session OT - End of Session Equipment Utilized During Treatment: Gait belt;Right knee immobilizer (RW, 3 in 1 over toilet, ADL A/E) Activity Tolerance: Patient tolerated treatment well Patient left: in chair;with call bell/phone within reach   Galen Manila 12/21/2011, 1:42 PM

## 2011-12-21 NOTE — Progress Notes (Signed)
Orthopedics Progress Note  Subjective: I feel better this AM  Objective:  Filed Vitals:   12/21/11 0518  BP: 102/45  Pulse: 76  Temp: 98.8 F (37.1 C)  Resp: 18    General: Awake and alert  Musculoskeletal: Right LE NVI,  Dressing CDI Neurovascularly intact  Lab Results  Component Value Date   WBC 5.9 12/11/2011   HGB 14.1 12/11/2011   HCT 42.1 12/11/2011   MCV 93.8 12/11/2011   PLT 160 12/11/2011       Component Value Date/Time   NA 144 12/11/2011 1501   K 4.3 12/11/2011 1501   CL 105 12/11/2011 1501   CO2 30 12/11/2011 1501   GLUCOSE 101* 12/11/2011 1501   BUN 9 12/11/2011 1501   CREATININE 0.55 12/11/2011 1501   CALCIUM 9.9 12/11/2011 1501   GFRNONAA >90 12/11/2011 1501   GFRAA >90 12/11/2011 1501    Lab Results  Component Value Date   INR 1.01 12/11/2011   INR 1.1 10/20/2007   INR 2.3* 07/14/2007    Assessment/Plan: POD #1 s/p Procedure(s): TOTAL KNEE ARTHROPLASTY OOB, PT, OT    DVT prophylaxis mechanical and coumadin Plan on D/C home with [facility] PT/OT/ RN on Monday  Viviann Spare R. [name], MD 12/21/2011 5:25 AM

## 2011-12-21 NOTE — Progress Notes (Signed)
Physical Therapy Evaluation Note  Past Medical History  Diagnosis Date  . Anxiety   . GERD (gastroesophageal reflux disease)   . Hyperlipemia   . Seasonal allergies     Past Surgical History  Procedure Date  . Joint replacement     lft knee  . Tonsillectomy   . Appendectomy      12/21/11 0929  PT Visit Information  Last PT Received On 12/21/11  Assistance Needed +1  PT Time Calculation  PT Start Time 0929  PT Stop Time 0952  PT Time Calculation (min) 23 min  Subjective Data  Subjective Pt received sitting up in chair presenting with 4/10 R knee.  Precautions  Precautions Knee  Required Braces or Orthoses Knee Immobilizer - Right  Knee Immobilizer - Right On when out of bed or walking  Restrictions  RLE Weight Bearing WBAT  Home Living  Lives With Spouse  Available Help at Discharge Family;Available 24 hours/day  Type of Home House  Home Access Stairs to enter  Entrance Stairs-Number of Steps 3  Entrance Stairs-Rails Left  Home Layout One level  Bathroom Field seismologist Yes  How Accessible Accessible via walker  Home Adaptive Equipment Bedside commode/3-in-1;Built-in shower seat;Walker - rolling  Additional Comments pt reports using shower chair recently  Prior Function  Level of Independence Independent  Able to Take Stairs? Yes  Driving Yes  Vocation Retired  Geneticist, molecular No difficulties  Cognition  Overall Cognitive Status Appears within functional limits for tasks assessed/performed  Arousal/Alertness Awake/alert  Orientation Level Oriented X4 / Intact  Behavior During Session [facility] for tasks performed  Right Upper Extremity Assessment  RUE ROM/Strength/Tone WFL  Left Upper Extremity Assessment  LUE ROM/Strength/Tone WFL  Right Lower Extremity Assessment  RLE ROM/Strength/Tone Deficits;Due to pain  RLE ROM/Strength/Tone Deficits pt with minimal quad set, 30 deg of R knee flex  AA  Left Lower Extremity Assessment  LLE ROM/Strength/Tone WFL  Trunk Assessment  Trunk Assessment Normal  Bed Mobility  Bed Mobility Not assessed (pt received sitting up in chair)  Transfers  Transfers Sit to Stand;Stand to Sit  Sit to Stand 4: Min assist;With armrests;With upper extremity assist;From chair/3-in-1  Stand to Sit 4: Min assist;With upper extremity assist;With armrests;To chair/3-in-1  Details for Transfer Assistance v/c's for hand placement and R LE management  Ambulation/Gait  Ambulation/Gait Assistance 4: Min assist  Ambulation Distance (Feet) 50 Feet  Assistive device Rolling walker  Ambulation/Gait Assistance Details max directional cues for walker sequence, max encouragment to achieve R quad set to limit R knee buckling  Gait Pattern Step-to pattern;Decreased step length - right;Decreased stance time - right;Antalgic  Gait velocity slow  Stairs No  Exercises  Exercises Total Joint (handout provided)  Total Joint Exercises  Ankle Circles/Pumps AROM;10 reps;Both;Supine  Quad Sets PROM;Right;10 reps;Seated  Heel Slides AAROM;Right;10 reps;Seated  Goniometric ROM 30 deg R knee  PT - End of Session  Equipment Utilized During Treatment Gait belt;Right knee immobilizer  Activity Tolerance Patient tolerated treatment well  Patient left in chair;with call bell/phone within reach  Nurse Communication Mobility status  PT Assessment  Clinical Impression Statement Pt s/p R TKA presenting with decreased R LE strenght, R knee ROM, and increased R knee pain. patient with report of good support at home and home set up. Will con't to progress patient and anticipate patient to be safe for d/c home s/p PT goals met.  PT Recommendation/Assessment Patient needs continued PT services  PT Problem List Decreased strength;Decreased range of motion;Decreased activity tolerance;Decreased balance;Decreased mobility  PT Therapy Diagnosis  Difficulty walking;Abnormality of  gait;Generalized weakness;Acute pain  PT Plan  PT Frequency 7X/week  PT Treatment/Interventions DME instruction;Gait training;Stair training;Functional mobility training;Therapeutic activities;Therapeutic exercise  PT Recommendation  Follow Up Recommendations Home health PT;Supervision/Assistance - 24 hour  Equipment Recommended None recommended by PT (pt has RW)  Individuals Consulted  Consulted and Agree with Results and Recommendations Patient  Acute Rehab PT Goals  PT Goal Formulation With patient  Time For Goal Achievement 12/28/11  Potential to Achieve Goals Good  Pt will go Supine/Side to Sit with modified independence;with HOB 0 degrees  PT Goal: Supine/Side to Sit - Progress Goal set today  Pt will go Sit to Stand with modified independence;with upper extremity assist (up to RW.)  PT Goal: Sit to Stand - Progress Goal set today  Pt will Ambulate with modified independence;with rolling walker;51 - 150 feet  PT Goal: Ambulate - Progress Goal set today  Pt will Go Up / Down Stairs 3-5 stairs;with min assist;with rail(s)  PT Goal: Up/Down Stairs - Progress Goal set today  Pt will Perform Home Exercise Program Independently  PT Goal: Perform Home Exercise Program - Progress Goal set today  PT General Charges  $$ ACUTE PT VISIT 1 Procedure  PT Evaluation  $Initial PT Evaluation Tier II 1 Procedure  Written Expression  Dominant Hand Right     Pain: increased pain with WBing on R LE however patient did not rate  Lewis Shock, PT, DPT Pager #: [phone] Office #: [phone]

## 2011-12-22 LAB — CBC
HCT: 33 % — ABNORMAL LOW (ref 36.0–46.0)
Platelets: 107 10*3/uL — ABNORMAL LOW (ref 150–400)
RBC: 3.51 MIL/uL — ABNORMAL LOW (ref 3.87–5.11)
RDW: 13.1 % (ref 11.5–15.5)
WBC: 7.1 10*3/uL (ref 4.0–10.5)

## 2011-12-22 LAB — PROTIME-INR: INR: 1.53 — ABNORMAL HIGH (ref 0.00–1.49)

## 2011-12-22 MED ORDER — WARFARIN SODIUM 5 MG PO TABS
5.0000 mg | ORAL_TABLET | Freq: Once | ORAL | Status: AC
  Administered 2011-12-22: 5 mg via ORAL
  Filled 2011-12-22: qty 1

## 2011-12-22 NOTE — Progress Notes (Addendum)
12/22/11 1442  PT Visit Information  Last PT Received On 12/22/11  PT Time Calculation  PT Start Time 1322  PT Stop Time 1356  PT Time Calculation (min) 34 min  Subjective Data  Subjective I want to try the steps while my husband is here  Patient Stated Goal Go home  Precautions  Precautions Knee  Required Braces or Orthoses Knee Immobilizer - Right  Knee Immobilizer - Right On when out of bed or walking  Restrictions  Weight Bearing Restrictions Yes  RLE Weight Bearing WBAT  Cognition  Overall Cognitive Status Appears within functional limits for tasks assessed/performed  Arousal/Alertness Awake/alert  Orientation Level Oriented X4 / Intact  Behavior During Session [facility] for tasks performed  Bed Mobility  Bed Mobility Sit to Supine  Sit to Supine 4: Min assist  Details for Bed Mobility Assistance Verbal cues for efficiency, assist for Rt. LE.   Transfers  Transfers Sit to Stand;Stand to Sit  Sit to Stand 5: Supervision  Stand to Sit 5: Supervision  Details for Transfer Assistance Verbal cues for Rt. LE placement and UE safe placement.   Ambulation/Gait  Ambulation/Gait Assistance 4: Min guard  Ambulation Distance (Feet) 140 Feet  Assistive device Rolling walker  Ambulation/Gait Assistance Details Verbal cues for improved heelstrike and normalized step length. Cues fo rincreased Rt. LE weight bearing.   Stairs Yes  Stairs Assistance 4: Min assist  Stairs Assistance Details (indicate cue type and reason) Demonstrated prior to performance. Pt and husband performing once backwards then once with both hands on Lt. rail ascending forwards. Pt given cues for safe sequencing and safety. Verbal cues for husband for safe placement/positioning.   Stair Management Technique One rail Left;Backwards;Forwards;With walker  Number of Stairs 4   Exercises  Exercises Total Joint  Total Joint Exercises  Ankle Circles/Pumps AROM;Both;10 reps;Supine  Short Arc Quad AROM;Right;10 reps  Hip  ABduction/ADduction AROM;Right;10 reps;Supine  Straight Leg Raises AROM;AAROM;Right;10 reps;Supine  PT - End of Session  Equipment Utilized During Treatment Gait belt  Activity Tolerance Patient tolerated treatment well  Patient left in chair  Nurse Communication Mobility status  PT - Assessment/Plan  Comments on Treatment Session Pt making good gains, tolerated steps well. Husband showing ability to appropriately assist pt with two different methods.   PT Plan Discharge plan remains appropriate  PT Frequency 7X/week  Follow Up Recommendations Home health PT  Equipment Recommended None recommended by PT  Acute Rehab PT Goals  Time For Goal Achievement 12/28/11  Potential to Achieve Goals Good  PT Goal: Supine/Side to Sit - Progress Progressing toward goal  PT Goal: Sit to Stand - Progress Progressing toward goal  PT Goal: Ambulate - Progress Progressing toward goal  PT Goal: Up/Down Stairs - Progress Progressing toward goal  PT Goal: Perform Home Exercise Program - Progress Progressing toward goal  PT General Charges  $$ ACUTE PT VISIT 1 Procedure  PT Treatments  $Gait Training 23-37 mins     Betty Heffernan (Cheek) Nithin Demeo PT, DPT Acute Rehabilitation [phone]

## 2011-12-22 NOTE — Progress Notes (Addendum)
Physical Therapy Treatment Patient Details Name: [name] MRN: [id number] DOB: 12/10/1940 Today's Date: 12/22/2011 Time: 4098-1191 PT Time Calculation (min): 24 min  PT Assessment / Plan / Recommendation Comments on Treatment Session  Pt improving with mobility, continues to have some pain. Pt motivated to get stronger to go home.     Follow Up Recommendations  Home health PT;Supervision/Assistance - 24 hour       Equipment Recommendations  None recommended by PT       Frequency 7X/week   Plan Discharge plan remains appropriate    Precautions / Restrictions Precautions Precautions: Knee Required Braces or Orthoses: Knee Immobilizer - Right Knee Immobilizer - Right: On when out of bed or walking Restrictions Weight Bearing Restrictions: No RLE Weight Bearing: Weight bearing as tolerated       Mobility  Bed Mobility Bed Mobility: Supine to Sit Supine to Sit: 5: Supervision Details for Bed Mobility Assistance: Supervision for safety Transfers Transfers: Sit to Stand;Stand to Sit Sit to Stand: With upper extremity assist;From chair/3-in-1;4: Min guard;From bed Stand to Sit: 4: Min assist;With upper extremity assist;To chair/3-in-1 Details for Transfer Assistance: v/c's for hand placement and R LE management for pain management Ambulation/Gait Ambulation/Gait Assistance: 4: Min guard Ambulation Distance (Feet): 110 Feet Assistive device: Rolling walker Ambulation/Gait Assistance Details: Encouragement to increase Rt. LE wegiht bearing. Verbal cues for posture and proximity of walker.  Gait Pattern: Step-to pattern;Decreased step length - right;Decreased stance time - right;Antalgic Gait velocity: slow    Exercises Total Joint Exercises Ankle Circles/Pumps: AROM;10 reps;Both;Seated Quad Sets: AROM;Both;10 reps;Seated Short Arc Quad: AROM;Right;10 reps;Seated Heel Slides: AAROM;5 reps;Right;Seated     PT Goals Acute Rehab PT Goals Time For Goal Achievement:  12/28/11 Potential to Achieve Goals: Good Pt will go Supine/Side to Sit: with modified independence;with HOB 0 degrees PT Goal: Supine/Side to Sit - Progress: Progressing toward goal Pt will go Sit to Stand: with modified independence;with upper extremity assist (up to RW.) PT Goal: Sit to Stand - Progress: Progressing toward goal Pt will Ambulate: with modified independence;with rolling walker;51 - 150 feet PT Goal: Ambulate - Progress: Progressing toward goal Pt will Perform Home Exercise Program: Independently PT Goal: Perform Home Exercise Program - Progress: Progressing toward goal  Visit Information  Last PT Received On: 12/22/11 Assistance Needed: +1    Subjective Data  Subjective: I'm in pain but I am willing to get up.  Patient Stated Goal: Get home   Cognition  Overall Cognitive Status: Appears within functional limits for tasks assessed/performed Arousal/Alertness: Awake/alert Orientation Level: Oriented X4 / Intact;Appears intact for tasks assessed Behavior During Session: [facility] for tasks performed       End of Session PT - End of Session Equipment Utilized During Treatment: Gait belt;Right knee immobilizer Activity Tolerance: Patient tolerated treatment well Patient left: in chair;with call bell/phone within reach Nurse Communication: Mobility status CPM Right Knee CPM Right Knee: Off    Sherrine Maples Cheek 12/22/2011, 2:42 PM  Sherie Don) Carleene Mains PT, DPT Acute Rehabilitation [phone]

## 2011-12-22 NOTE — Progress Notes (Signed)
[name]  MRN: [id number] DOB/Age: 1940/09/04 71 y.o. Physician: Jacquelyne Balint Procedure: Procedure(s) (LRB): TOTAL KNEE ARTHROPLASTY (Right)     Subjective: Up in chair. Did great with PT today.  Vital Signs Temp:  [98.5 F (36.9 C)-99.3 F (37.4 C)] 99 F (37.2 C) (06/23 0626) Pulse Rate:  [84-100] 84  (06/23 0626) Resp:  [16-18] 16  (06/23 0626) BP: (106-129)/(44-58) 129/58 mmHg (06/23 0626) SpO2:  [94 %-99 %] 94 % (06/23 0626)  Lab Results  Basename 12/22/11 0638 12/21/11 0540  WBC 7.1 6.9  HGB 10.8* 11.5*  HCT 33.0* 35.6*  PLT 107* 112*   BMET  Basename 12/21/11 0540  NA 143  K 3.9  CL 106  CO2 29  GLUCOSE 115*  BUN 10  CREATININE 0.64  CALCIUM 8.8   INR  Date Value Range Status  12/22/2011 1.53* 0.00 - 1.49 Final     Exam Dressing changed. Incision dry.  NVI minimal swelling        Plan Cont PT/OT per protocol.  Poss DC tom  Revel Stellmach for Dr.Kevin Supple 12/22/2011, 1:20 PM

## 2011-12-22 NOTE — Progress Notes (Signed)
ANTICOAGULATION CONSULT NOTE - Follow UP Consult  Pharmacy Consult for Warfarin Indication: VTE prophylaxis s/p R TKA  Allergies  Allergen Reactions  . Cefadroxil Hives    Patient can take amoxicillin and cipro  . Morphine And Related Itching    Patient Measurements:   Total body Wt 83.6kg  Vital Signs: Temp: 99 F (37.2 C) (06/23 0626) Temp src: Oral (06/23 0626) BP: 129/58 mmHg (06/23 0626) Pulse Rate: 84  (06/23 0626)  Labs:  Alvira Philips 12/22/11 4782 12/21/11 0540  HGB 10.8* 11.5*  HCT 33.0* 35.6*  PLT 107* 112*  APTT -- --  LABPROT 18.7* 16.1*  INR 1.53* 1.26  HEPARINUNFRC -- --  CREATININE -- 0.64  CKTOTAL -- --  CKMB -- --  TROPONINI -- --    CrCl is unknown because there is no height on file for the current visit.   Medical History: Past Medical History  Diagnosis Date  . Anxiety   . GERD (gastroesophageal reflux disease)   . Hyperlipemia   . Seasonal allergies     [name]  Coumadin started for VTE prophylaxis. Hgb relatively stable, platelets remain on low = 107 (appears plts normally run 120-160).  INR 1.53 - trending up nicely with 5mg  x 2 doses. No bleeding noted. On celebrex  Goal of Therapy:  INR 2-3 Monitor platelets by anticoagulation protocol: Yes   Plan:  Coumadin 5mg  x1, cont to follow platelets (CBC for mon) Daily INR  Suzette Battiest, Tad Moore 12/22/2011,10:59 AM

## 2011-12-23 ENCOUNTER — Encounter (HOSPITAL_COMMUNITY): Payer: Self-pay | Admitting: General Practice

## 2011-12-23 LAB — GLUCOSE, CAPILLARY

## 2011-12-23 LAB — CBC
HCT: 31.1 % — ABNORMAL LOW (ref 36.0–46.0)
Platelets: 114 10*3/uL — ABNORMAL LOW (ref 150–400)
RDW: 13.1 % (ref 11.5–15.5)
WBC: 6 10*3/uL (ref 4.0–10.5)

## 2011-12-23 LAB — PROTIME-INR: INR: 1.91 — ABNORMAL HIGH (ref 0.00–1.49)

## 2011-12-23 MED ORDER — WARFARIN SODIUM 5 MG PO TABS: 5.0000 mg | ORAL_TABLET | Freq: Every day | ORAL | Status: DC

## 2011-12-23 MED ORDER — WARFARIN SODIUM 2.5 MG PO TABS
2.5000 mg | ORAL_TABLET | Freq: Once | ORAL | Status: DC
  Filled 2011-12-23: qty 1

## 2011-12-23 NOTE — Progress Notes (Signed)
PT Progress Note:     12/23/11 1500  PT Visit Information  Last PT Received On 12/23/11  Assistance Needed +1  PT Time Calculation  PT Start Time 0853  PT Stop Time 0928  PT Time Calculation (min) 35 min  Precautions  Precautions Knee  Required Braces or Orthoses Knee Immobilizer - Right  Knee Immobilizer - Right On when out of bed or walking  Restrictions  RLE Weight Bearing WBAT  Cognition  Overall Cognitive Status Appears within functional limits for tasks assessed/performed  Arousal/Alertness Awake/alert  Orientation Level Oriented X4 / Intact  Behavior During Session [facility] for tasks performed  Bed Mobility  Bed Mobility Supine to Sit  Supine to Sit 6: Modified independent (Device/Increase time);HOB flat  Transfers  Transfers Sit to Stand;Stand to Sit  Sit to Stand 5: Supervision;With upper extremity assist;From bed  Stand to Sit 5: Supervision;With upper extremity assist;With armrests;To chair/3-in-1  Details for Transfer Assistance VCs for safest hand placement & RLE positioning  Ambulation/Gait  Ambulation/Gait Assistance 4: Min guard  Ambulation Distance (Feet) 100 Feet (60' + 2' )  Assistive device Rolling walker  Ambulation/Gait Assistance Details cues for increased step length, safe body positining inside RW, tall posture.  Pt fatigues quickly.  Encouragement to increase distance.   Gait Pattern Step-through pattern;Decreased stance time - right;Decreased step length - left;Antalgic  Gait velocity slow  Stairs Yes  Stairs Assistance 4: Min guard  Stairs Assistance Details (indicate cue type and reason) Pt performed stairs utilizing forwards technique with rail on Lt & bil UE support on Lt.     Stair Management Technique One rail Left;Step to pattern;Forwards  Number of Stairs 3  (2x's. )  Engineering geologist No  PT - End of Session  Equipment Utilized During Treatment Gait belt  Activity Tolerance Patient tolerated treatment well    Patient left in chair  Nurse Communication Mobility status  PT - Assessment/Plan  Comments on Treatment Session Pt's husband present entire session.     PT Plan Discharge plan remains appropriate  PT Frequency 7X/week  Follow Up Recommendations Home health PT  Equipment Recommended None recommended by PT  Acute Rehab PT Goals  Time For Goal Achievement 12/28/11  Potential to Achieve Goals Good  PT Goal: Supine/Side to Sit - Progress Progressing toward goal  PT Goal: Sit to Stand - Progress Progressing toward goal  PT Goal: Ambulate - Progress Progressing toward goal  PT Goal: Up/Down Stairs - Progress Met     Verdell Face, PTA [phone] 12/23/2011

## 2011-12-23 NOTE — Discharge Summary (Signed)
Physician Discharge Summary  Patient ID: [name] MRN: [id number] DOB/AGE: 73-Mar-1942 73 y.o.  Admit date: 12/20/2011 Discharge date: 12/23/2011  Admission Diagnoses:  Principal Problem:  *Osteoarthrosis, unspecified whether generalized or localized, lower leg   Discharge Diagnoses:  Same   Surgeries: Procedure(s): TOTAL KNEE ARTHROPLASTY on 12/20/2011   Consultants: PT/OT  Discharged Condition: Stable  Hospital Course: [name] is an 71 y.o. female who was admitted 12/20/2011 with a chief complaint of No chief complaint on file. , and found to have a diagnosis of Osteoarthrosis, unspecified whether generalized or localized, lower leg.  They were brought to the operating room on 12/20/2011 and underwent the above named procedures.    The patient had an uncomplicated hospital course and was stable for discharge.  Recent vital signs:  Filed Vitals:   12/23/11 0530  BP: 117/53  Pulse: 79  Temp: 98.4 F (36.9 C)  Resp: 18    Recent laboratory studies:  Results for orders placed during the hospital encounter of 12/20/11  PROTIME-INR      Component Value Range   Prothrombin Time 16.1 (*) 11.6 - 15.2 seconds   INR 1.26  0.00 - 1.49  CBC      Component Value Range   WBC 6.9  4.0 - 10.5 K/uL   RBC 3.72 (*) 3.87 - 5.11 MIL/uL   Hemoglobin 11.5 (*) 12.0 - 15.0 g/dL   HCT 14.7 (*) 82.9 - 56.2 %   MCV 95.7  78.0 - 100.0 fL   MCH 30.9  26.0 - 34.0 pg   MCHC 32.3  30.0 - 36.0 g/dL   RDW 13.0  86.5 - 78.4 %   Platelets 112 (*) 150 - 400 K/uL  BASIC METABOLIC PANEL      Component Value Range   Sodium 143  135 - 145 mEq/L   Potassium 3.9  3.5 - 5.1 mEq/L   Chloride 106  96 - 112 mEq/L   CO2 29  19 - 32 mEq/L   Glucose, Bld 115 (*) 70 - 99 mg/dL   BUN 10  6 - 23 mg/dL   Creatinine, Ser 6.96  0.50 - 1.10 mg/dL   Calcium 8.8  8.4 - 29.5 mg/dL   GFR calc non Af Amer 86 (*) >90 mL/min   GFR calc Af Amer >90  >90 mL/min  PROTIME-INR      Component Value Range   Prothrombin Time 18.7 (*) 11.6 - 15.2 seconds   INR 1.53 (*) 0.00 - 1.49  CBC      Component Value Range   WBC 7.1  4.0 - 10.5 K/uL   RBC 3.51 (*) 3.87 - 5.11 MIL/uL   Hemoglobin 10.8 (*) 12.0 - 15.0 g/dL   HCT 28.4 (*) 13.2 - 44.0 %   MCV 94.0  78.0 - 100.0 fL   MCH 30.8  26.0 - 34.0 pg   MCHC 32.7  30.0 - 36.0 g/dL   RDW 10.2  72.5 - 36.6 %   Platelets 107 (*) 150 - 400 K/uL  PROTIME-INR      Component Value Range   Prothrombin Time 22.2 (*) 11.6 - 15.2 seconds   INR 1.91 (*) 0.00 - 1.49  CBC      Component Value Range   WBC 6.0  4.0 - 10.5 K/uL   RBC 3.30 (*) 3.87 - 5.11 MIL/uL   Hemoglobin 10.4 (*) 12.0 - 15.0 g/dL   HCT 44.0 (*) 34.7 - 42.5 %   MCV 94.2  78.0 -  100.0 fL   MCH 31.5  26.0 - 34.0 pg   MCHC 33.4  30.0 - 36.0 g/dL   RDW 16.1  09.6 - 04.5 %   Platelets 114 (*) 150 - 400 K/uL  GLUCOSE, CAPILLARY      Component Value Range   Glucose-Capillary 118 (*) 70 - 99 mg/dL   Comment 1 Notify RN      Discharge Medications:   Medication List  As of 12/23/2011 12:42 PM   TAKE these medications         albuterol 108 (90 BASE) MCG/ACT inhaler   Commonly known as: PROVENTIL HFA;VENTOLIN HFA   Inhale 2 puffs into the lungs daily as needed. For seasonal allergies      ALPRAZolam 0.25 MG tablet   Commonly known as: XANAX   Take 0.25 mg by mouth as needed. For anxiety      amoxicillin 500 MG capsule   Commonly known as: AMOXIL   Take 2,000 mg by mouth See admin instructions. Before a dental visit      CALCIUM CITRATE + PO   Take 1,000 mg by mouth daily.      cholecalciferol 1000 UNITS tablet   Commonly known as: VITAMIN D   Take 2,000 Units by mouth daily.      docusate sodium 100 MG capsule   Commonly known as: COLACE   Take 300 mg by mouth at bedtime.      guaiFENesin 600 MG 12 hr tablet   Commonly known as: MUCINEX   Take 600 mg by mouth daily as needed. For allergies during allergy season      HYDROcodone-acetaminophen 5-325 MG per tablet   Commonly  known as: NORCO   Take 1-2 tablets by mouth every 6 (six) hours as needed. For pain      loratadine 10 MG tablet   Commonly known as: CLARITIN   Take 10 mg by mouth daily.      methocarbamol 500 MG tablet   Commonly known as: ROBAXIN   Take 1 tablet (500 mg total) by mouth 3 (three) times daily as needed.      multivitamin with minerals Tabs   Take 1 tablet by mouth daily.      omeprazole 20 MG capsule   Commonly known as: PRILOSEC   Take 20 mg by mouth daily.      oxyCODONE-acetaminophen 5-325 MG per tablet   Commonly known as: PERCOCET   Take 1-2 tablets by mouth every 4 (four) hours as needed for pain.      polyethylene glycol packet   Commonly known as: MIRALAX / GLYCOLAX   Take 17 g by mouth daily.      propranolol 20 MG tablet   Commonly known as: INDERAL   Take 20 mg by mouth daily.      sertraline 100 MG tablet   Commonly known as: ZOLOFT   Take 100 mg by mouth daily.      simvastatin 40 MG tablet   Commonly known as: ZOCOR   Take 20 mg by mouth every other day.      timolol 0.5 % ophthalmic solution   Commonly known as: BETIMOL   Place 1 drop into both eyes daily.      vitamin C 500 MG tablet   Commonly known as: ASCORBIC ACID   Take 1,000 mg by mouth daily.      warfarin 5 MG tablet   Commonly known as: COUMADIN   Take 1 tablet (5 mg total)  by mouth daily.            Diagnostic Studies: Dg Chest 2 View  12/11/2011  *RADIOLOGY REPORT*  Clinical Data: Preop right knee arthroplasty  CHEST - 2 VIEW  Comparison: 07/10/2007  Findings: Cardiomediastinal silhouette is stable.  No acute infiltrate or pleural effusion.  No pulmonary edema. Stable mild degenerative changes thoracic spine.  IMPRESSION: No active disease.  No significant change.  Original Report Authenticated By: Natasha Mead, M.D.   X-ray Knee Right Port  12/20/2011  *RADIOLOGY REPORT*  Clinical Data: Status post knee replacement.  PORTABLE RIGHT KNEE - 1-2 VIEW  Comparison: None.  Findings: The  patient has a new right total knee replacement.  The device is located and no fracture is identified.  Gas in the soft tissues from surgery is noted.  IMPRESSION: Right total knee without evidence of complication.  Original Report Authenticated By: Bernadene Bell. Maricela Curet, M.D.    Disposition:   Discharge Orders    Future Orders Please Complete By Expires   Diet - low sodium heart healthy      Call MD / Call 911      Comments:   If you experience chest pain or shortness of breath, CALL 911 and be transported to the hospital emergency room.  If you develope a fever above 101 F, pus (white drainage) or increased drainage or redness at the wound, or calf pain, call your surgeon's office.   Constipation Prevention      Comments:   Drink plenty of fluids.  Prune juice may be helpful.  You may use a stool softener, such as Colace (over the counter) 100 mg twice a day.  Use MiraLax (over the counter) for constipation as needed.   Increase activity slowly as tolerated      Do not put a pillow under the knee. Place it under the heel.         Follow-up Information    Follow up with NORRIS,STEVEN R, MD. Call in 2 weeks. [phone])    Contact information:   [facility] [address] [place] [address] [phone]       Follow up with [name]. [facility] Physical Therapy, Occupational Therapy, and RN)    Contact information:   [phone]          Signed: Thea Gist 12/23/2011, 12:42 PM

## 2011-12-23 NOTE — Progress Notes (Signed)
ANTICOAGULATION CONSULT NOTE - Follow UP Consult  Pharmacy Consult for Warfarin Indication: VTE prophylaxis s/p R TKA  Allergies  Allergen Reactions  . Cefadroxil Hives    Patient can take amoxicillin and cipro  . Morphine And Related Itching    Patient Measurements:   Total body Wt 83.6kg  Vital Signs: Temp: 98.4 F (36.9 C) (06/24 0530) BP: 117/53 mmHg (06/24 0530) Pulse Rate: 79  (06/24 0530)  Labs:  Basename 12/23/11 0542 12/22/11 1610 12/21/11 0540  HGB 10.4* 10.8* --  HCT 31.1* 33.0* 35.6*  PLT 114* 107* 112*  APTT -- -- --  LABPROT 22.2* 18.7* 16.1*  INR 1.91* 1.53* 1.26  HEPARINUNFRC -- -- --  CREATININE -- -- 0.64  CKTOTAL -- -- --  CKMB -- -- --  TROPONINI -- -- --    CrCl is unknown because there is no height on file for the current visit.   Medical History: Past Medical History  Diagnosis Date  . Anxiety   . GERD (gastroesophageal reflux disease)   . Hyperlipemia   . Seasonal allergies     [name]  Coumadin started for VTE prophylaxis. Hgb relatively stable, platelets remain on low = 114 (appears plts normally run 120-160). Hgb stable.  INR 1.91 - trending up  with 5mg  x 3 doses. No bleeding noted. On celebrex  Goal of Therapy:  INR 2-3 Monitor platelets by anticoagulation protocol: Yes   Juliette Alcide, PharmD, BCPS.  Pager: [phone] 12/23/2011 10:38 AM

## 2011-12-23 NOTE — Progress Notes (Signed)
Occupational Therapy Treatment Patient Details Name: [name] MRN: [id number] DOB: 03-29-1941 Today's Date: 12/23/2011 Time: 0820-0850 OT Time Calculation (min): 30 min  OT Assessment / Plan / Recommendation Comments on Treatment Session Pt doing well. Pt has on clothes and states she is ready to go home                   Plan Discharge plan remains appropriate    Precautions / Restrictions Restrictions Weight Bearing Restrictions: Yes RLE Weight Bearing: Weight bearing as tolerated       ADL  Lower Body Bathing: Performed;Minimal assistance Where Assessed - Lower Body Bathing: Supported sit to stand;Other (comment) (with walker) Toilet Transfer: Performed;Min guard Toilet Transfer Method: Other (comment);Sit to stand (ambulating with walker) Toilet Transfer Equipment: Comfort height toilet Toileting - Clothing Manipulation and Hygiene: Performed;Min guard Where Assessed - Toileting Clothing Manipulation and Hygiene: Standing Equipment Used: Knee Immobilizer;Rolling walker ADL Comments: Discussed safety with ADL activity, walker safety in her home, etc. Husband will provided assist as needed.        OT Goals ADL Goals ADL Goal: Grooming - Progress: Progressing toward goals ADL Goal: Lower Body Bathing - Progress: Progressing toward goals ADL Goal: Lower Body Dressing - Progress: Progressing toward goals ADL Goal: Toilet Transfer - Progress: Progressing toward goals ADL Goal: Toileting - Clothing Manipulation - Progress: Progressing toward goals ADL Goal: Toileting - Hygiene - Progress: Progressing toward goals  Visit Information  Last OT Received On: 12/23/11          Cognition  Overall Cognitive Status: Appears within functional limits for tasks assessed/performed Arousal/Alertness: Awake/alert Orientation Level: Oriented X4 / Intact Behavior During Session: [facility] for tasks performed             End of Session OT - End of Session Activity Tolerance:  Patient tolerated treatment well Patient left: in bed;with call bell/phone within reach   [facility], Karin Golden D 12/23/2011, 11:14 AM

## 2011-12-23 NOTE — Progress Notes (Signed)
Orthopedics Progress Note  Subjective: Pt sitting up comfortably with only minimal soreness to right knee s/p knee replacement Pt ready for discharge  Objective:  Filed Vitals:   12/23/11 0530  BP: 117/53  Pulse: 79  Temp: 98.4 F (36.9 C)  Resp: 18    General: Awake and alert  Musculoskeletal: knee incision healing well, nv intact distally Neurovascularly intact  Lab Results  Component Value Date   WBC 6.0 12/23/2011   HGB 10.4* 12/23/2011   HCT 31.1* 12/23/2011   MCV 94.2 12/23/2011   PLT 114* 12/23/2011       Component Value Date/Time   NA 143 12/21/2011 0540   K 3.9 12/21/2011 0540   CL 106 12/21/2011 0540   CO2 29 12/21/2011 0540   GLUCOSE 115* 12/21/2011 0540   BUN 10 12/21/2011 0540   CREATININE 0.64 12/21/2011 0540   CALCIUM 8.8 12/21/2011 0540   GFRNONAA 86* 12/21/2011 0540   GFRAA >90 12/21/2011 0540    Lab Results  Component Value Date   INR 1.91* 12/23/2011   INR 1.53* 12/22/2011   INR 1.26 12/21/2011    Assessment/Plan: POD #3 s/p Procedure(s): right TOTAL KNEE ARTHROPLASTY  D/c home today Home health pt/ot Pain control F/u in 2 weeks  [name]. [name], MD 12/23/2011 12:39 PM

## 2011-12-24 MED ORDER — CEFAZOLIN SODIUM 1-5 GM-% IV SOLN
INTRAVENOUS | Status: DC | PRN
  Administered 2011-12-20: 2 g via INTRAVENOUS

## 2011-12-24 NOTE — Addendum Note (Signed)
Addendum  created 12/24/11 1004 by Quentin Ore, CRNA   Modules edited:Anesthesia Medication Administration

## 2011-12-24 NOTE — Addendum Note (Signed)
Addendum  created 12/24/11 1004 by Camarion Weier E Pheobe Sandiford, CRNA   Modules edited:Anesthesia Medication Administration    

## 2011-12-26 ENCOUNTER — Encounter (HOSPITAL_COMMUNITY): Payer: Self-pay | Admitting: Orthopedic Surgery

## 2012-01-21 ENCOUNTER — Ambulatory Visit: Payer: Medicare Other | Attending: Orthopedic Surgery

## 2012-01-21 DIAGNOSIS — R269 Unspecified abnormalities of gait and mobility: Secondary | ICD-10-CM | POA: Insufficient documentation

## 2012-01-21 DIAGNOSIS — M25669 Stiffness of unspecified knee, not elsewhere classified: Secondary | ICD-10-CM | POA: Insufficient documentation

## 2012-01-21 DIAGNOSIS — Z96659 Presence of unspecified artificial knee joint: Secondary | ICD-10-CM | POA: Insufficient documentation

## 2012-01-21 DIAGNOSIS — M6281 Muscle weakness (generalized): Secondary | ICD-10-CM | POA: Insufficient documentation

## 2012-01-21 DIAGNOSIS — M25569 Pain in unspecified knee: Secondary | ICD-10-CM | POA: Insufficient documentation

## 2012-01-21 DIAGNOSIS — IMO0001 Reserved for inherently not codable concepts without codable children: Secondary | ICD-10-CM | POA: Insufficient documentation

## 2012-01-23 ENCOUNTER — Ambulatory Visit: Payer: Medicare Other | Admitting: Physical Therapy

## 2012-01-28 ENCOUNTER — Ambulatory Visit: Payer: Medicare Other

## 2012-01-30 ENCOUNTER — Ambulatory Visit: Payer: Medicare Other | Attending: Orthopedic Surgery

## 2012-01-30 DIAGNOSIS — M25669 Stiffness of unspecified knee, not elsewhere classified: Secondary | ICD-10-CM | POA: Insufficient documentation

## 2012-01-30 DIAGNOSIS — M6281 Muscle weakness (generalized): Secondary | ICD-10-CM | POA: Insufficient documentation

## 2012-01-30 DIAGNOSIS — R269 Unspecified abnormalities of gait and mobility: Secondary | ICD-10-CM | POA: Insufficient documentation

## 2012-01-30 DIAGNOSIS — M25569 Pain in unspecified knee: Secondary | ICD-10-CM | POA: Insufficient documentation

## 2012-01-30 DIAGNOSIS — IMO0001 Reserved for inherently not codable concepts without codable children: Secondary | ICD-10-CM | POA: Insufficient documentation

## 2012-02-04 ENCOUNTER — Ambulatory Visit: Payer: Medicare Other

## 2012-02-05 ENCOUNTER — Encounter: Payer: Medicare Other | Admitting: Physical Therapy

## 2012-02-06 ENCOUNTER — Ambulatory Visit: Payer: Medicare Other

## 2012-02-10 ENCOUNTER — Ambulatory Visit: Payer: Medicare Other | Admitting: Physical Therapy

## 2012-02-12 ENCOUNTER — Ambulatory Visit: Payer: Medicare Other | Admitting: Physical Therapy

## 2012-02-17 ENCOUNTER — Ambulatory Visit: Payer: Medicare Other | Admitting: Physical Therapy

## 2012-02-19 ENCOUNTER — Ambulatory Visit: Payer: Medicare Other | Admitting: Physical Therapy

## 2012-02-24 ENCOUNTER — Ambulatory Visit: Payer: Medicare Other | Admitting: Physical Therapy

## 2012-02-26 ENCOUNTER — Ambulatory Visit: Payer: Medicare Other | Admitting: Physical Therapy

## 2012-03-05 ENCOUNTER — Ambulatory Visit: Payer: Medicare Other

## 2012-03-09 ENCOUNTER — Encounter: Payer: Medicare Other | Admitting: Physical Therapy

## 2012-03-11 ENCOUNTER — Encounter: Payer: Medicare Other | Admitting: Physical Therapy

## 2014-08-17 DIAGNOSIS — H02834 Dermatochalasis of left upper eyelid: Secondary | ICD-10-CM

## 2014-08-17 DIAGNOSIS — H02423 Myogenic ptosis of bilateral eyelids: Secondary | ICD-10-CM | POA: Insufficient documentation

## 2014-08-17 DIAGNOSIS — H02831 Dermatochalasis of right upper eyelid: Secondary | ICD-10-CM | POA: Insufficient documentation

## 2014-12-08 ENCOUNTER — Ambulatory Visit (INDEPENDENT_AMBULATORY_CARE_PROVIDER_SITE_OTHER): Payer: Medicare HMO | Admitting: Neurology

## 2014-12-08 ENCOUNTER — Encounter: Payer: Self-pay | Admitting: Neurology

## 2014-12-08 VITALS — BP 112/70 | HR 78 | Resp 16 | Ht 62.0 in | Wt 185.0 lb

## 2014-12-08 DIAGNOSIS — R0683 Snoring: Secondary | ICD-10-CM

## 2014-12-08 DIAGNOSIS — R51 Headache: Secondary | ICD-10-CM | POA: Diagnosis not present

## 2014-12-08 DIAGNOSIS — R519 Headache, unspecified: Secondary | ICD-10-CM

## 2014-12-08 DIAGNOSIS — G2581 Restless legs syndrome: Secondary | ICD-10-CM | POA: Diagnosis not present

## 2014-12-08 DIAGNOSIS — R600 Localized edema: Secondary | ICD-10-CM

## 2014-12-08 DIAGNOSIS — G478 Other sleep disorders: Secondary | ICD-10-CM

## 2014-12-08 DIAGNOSIS — G4719 Other hypersomnia: Secondary | ICD-10-CM

## 2014-12-08 NOTE — Patient Instructions (Signed)
Based on your symptoms and your exam I believe you are at risk for obstructive sleep apnea or OSA, and I think we should proceed with a sleep study to determine whether you do or do not have OSA and how severe it is. If you have more than mild OSA, I want you to consider treatment with CPAP. Please remember, the risks and ramifications of moderate to severe obstructive sleep apnea or OSA are: Cardiovascular disease, including congestive heart failure, stroke, difficult to control hypertension, arrhythmias, and even type 2 diabetes has been linked to untreated OSA. Sleep apnea causes disruption of sleep and sleep deprivation in most cases, which, in turn, can cause recurrent headaches, problems with memory, mood, concentration, focus, and vigilance. Most people with untreated sleep apnea report excessive daytime sleepiness, which can affect their ability to drive. Please do not drive if you feel sleepy.  I will see you back after your sleep study to go over the test results and where to go from there. We will call you after your sleep study and to set up an appointment at the time.   Our sleep lab administrative assistant, Alycia will call you to schedule your sleep study. If you don't hear back from her by next week please feel free to call her at [phone]. This is her direct line and please leave a message with your phone number to call back if you get the voicemail box.   

## 2014-12-08 NOTE — Progress Notes (Addendum)
Subjective:    Patient ID: [name] is a 74 y.o. female.  HPI     Star Age, MD, PhD [facility] [address], [address] [place], [place] [address]   Dear Dr. Joylene Draft,   I saw your patient, [name], upon your kind request in my neurologic clinic today for initial consultation of her sleep disorder, in particular, her excessive daytime somnolence. The patient is unaccompanied today. As you know, [name] is a 74 year old right-handed woman with an underlying medical history of hyperlipidemia, osteopenia, osteoarthritis, status post left total knee arthroplasty in January 2009, status post cataract surgeries, status post tonsillectomy, status post right total knee arthroplasty in June 2013, vertigo, impaired fasting glucose, allergic rhinitis, glaucoma, depression, restless leg syndrome, macular degeneration, and obesity, who reports snoring and excessive daytime somnolence. 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 She used to smoke. She quit in 1996. She drinks alcohol approximately 4 times a week in the form of wine, approximately 4 ounces each time. She drinks about 1-2 cups of coffee every day and perhaps 1 soda per day at lunch. Her main concern with her sleep is daytime tiredness and somnolence. She will take a prolonged nap usually on Sundays. Sometimes during the weekdays when she is sedentary she will doze off. She goes to bed around midnight. Her husband sleeps in a separate bedroom but they do go to bed at the same time. She snores some. She does not wake herself up with a gasping sensation and he has not mentioned any pauses in her breathing recently but then again they do not typically share the same bedroom. She has restless leg symptoms, especially in the evenings when she is sitting in her chair. This is not a night to night occurrence and occasionally she will take ropinirole. She takes it on on average once a week. She has  nocturia once at night if at all. She occasionally has headaches in the mornings. Her rise time is 8 AM. She does not typically feel very rested. She had a tonsillectomy as a child.  Her Epworth sleepiness score is 8 out of 24 today and her fatigue scores 38 out of 63.  Her Past Medical History Is Significant For: Past Medical History  Diagnosis Date  . Anxiety   . GERD (gastroesophageal reflux disease)   . Hyperlipemia   . Seasonal allergies   . History of blood transfusion ~ 2008    "right after but not related to left knee replacement"  . Lower GI bleeding ~ 2008    "had to have blood transfusion"  . Arthritis     "bilateral knees, shoulders, elbows; pretty widespread"  . Depression   . Sleeping excessive   . Cancer     H/O basal skin cancer 2001  . Osteopenia   . Tinnitus of right ear   . BPPV (benign paroxysmal positional vertigo)   . Glaucoma   . Macular degeneration     Her Past Surgical History Is Significant For: Past Surgical History  Procedure Laterality Date  . Joint replacement      lft knee  . Total knee arthroplasty  ~ 2008; 12/20/11    left; right  . Tonsillectomy and adenoidectomy  1946  . Tubal ligation  1980's  . Cataract extraction w/ intraocular lens  implant, bilateral  1990's  . Total knee arthroplasty  12/20/2011    Procedure: TOTAL KNEE ARTHROPLASTY;  Surgeon: Augustin Schooling,  MD;  Location: [facility];  Service: Orthopedics;  Laterality: Right;  Right Total Knee Arthroplasty    Her Family History Is Significant For: Family History  Problem Relation Age of Onset  . Heart attack Mother   . Heart disease Father   . Heart failure Father   . Bladder Cancer Father   . Supraventricular tachycardia Sister   . Hypertension Sister     Her Social History Is Significant For: History   Social History  . Marital Status: Married    Spouse Name: Clair Gulling   . Number of Children: 3  . Years of Education: College   Occupational History  . Retired Pharmacist, hospital     Social History Main Topics  . Smoking status: Former Smoker -- 1.00 packs/day for 20 years    Quit date: 07/02/1995  . Smokeless tobacco: Never Used     Comment: 12/23/11 "quit smoking ~ 15 years ago; smoked about 20 years"  . Alcohol Use: 3.0 oz/week    5 Glasses of wine per week     Comment: 12/23/11 "6 oz wine, 5 days/wk"  . Drug Use: No  . Sexual Activity: Yes   Other Topics Concern  . None   Social History Narrative   1-2 cups of coffee a day     Her Allergies Are:  Allergies  Allergen Reactions  . Morphine And Related Itching  . Cefadroxil Hives    Patient can take amoxicillin and cipro  :   Her Current Medications Are:  Outpatient Encounter Prescriptions as of 12/08/2014  Medication Sig  . alendronate (FOSAMAX) 70 MG tablet Take 70 mg by mouth once a week. Take with a full glass of water on an empty stomach.  . ALPRAZolam (XANAX) 0.25 MG tablet Take 0.25 mg by mouth as needed. For anxiety  . amoxicillin (AMOXIL) 500 MG capsule Take 2,000 mg by mouth See admin instructions. Before a dental visit  . Calcium Citrate-Vitamin D (CALCIUM CITRATE + PO) Take 1,000 mg by mouth daily.  . cholecalciferol (VITAMIN D) 1000 UNITS tablet Take 2,000 Units by mouth daily.   . dorzolamide-timolol (COSOPT) 22.3-6.8 MG/ML ophthalmic solution INSTILL 1 DROP IN BOTH EYES BID  . Multiple Vitamin (MULTIVITAMIN WITH MINERALS) TABS Take 1 tablet by mouth daily.  Marland Kitchen omeprazole (PRILOSEC) 20 MG capsule Take 20 mg by mouth daily.   . propranolol (INDERAL) 20 MG tablet Take 20 mg by mouth daily.  Marland Kitchen rOPINIRole (REQUIP) 1 MG tablet Take 1 mg by mouth at bedtime.  . sertraline (ZOLOFT) 100 MG tablet Take 100 mg by mouth daily.  . simvastatin (ZOCOR) 20 MG tablet Take 20 mg by mouth daily.  . timolol (BETIMOL) 0.5 % ophthalmic solution Place 1 drop into both eyes daily.   . vitamin C (ASCORBIC ACID) 500 MG tablet Take 1,000 mg by mouth daily.   Marland Kitchen albuterol (PROVENTIL HFA;VENTOLIN HFA) 108 (90 BASE)  MCG/ACT inhaler Inhale 2 puffs into the lungs daily as needed. For seasonal allergies  . HYDROcodone-acetaminophen (NORCO) 5-325 MG per tablet Take 1-2 tablets by mouth every 6 (six) hours as needed. For pain   No facility-administered encounter medications on file as of 12/08/2014.  :  Review of Systems:  Out of a complete 14 point review of systems, all are reviewed and negative with the exception of these symptoms as listed below:   Review of Systems  Constitutional: Positive for fatigue.       Weight gain   HENT: Positive for hearing loss and tinnitus.  Respiratory: Positive for shortness of breath.   Cardiovascular: Positive for palpitations and leg swelling.  Gastrointestinal:       Incontinence   Genitourinary:       Incontinence   Musculoskeletal:       Joint pain   Neurological:       Restless legs, No trouble falling or staying asleep at night, snoring, daytime sleepiness, wakes up feeling tired in the morning, takes naps during day, falls asleep when sitting still.   Psychiatric/Behavioral:       Depression, too much sleep, decreased energy, disinterest in activities    Objective:  Neurologic Exam  Physical Exam Physical Examination:   Filed Vitals:   12/08/14 1450  BP: 112/70  Pulse: 78  Resp: 16   General Examination: The patient is a very pleasant 74 y.o. female in no acute distress. She appears well-developed and well-nourished and well groomed.   HEENT: Normocephalic, atraumatic, pupils are equal, round and reactive to light and accommodation. Funduscopic exam is normal with sharp disc margins noted. She is status post bilateral cataract repairs. Extraocular tracking is good without limitation to gaze excursion or nystagmus noted. Normal smooth pursuit is noted. Hearing is grossly intact. Tympanic membranes are clear bilaterally. Face is symmetric with normal facial animation and normal facial sensation. Speech is clear with no dysarthria noted. There is no  hypophonia. There is no lip, neck/head, jaw or voice tremor. Neck is supple with full range of passive and active motion. There are no carotid bruits on auscultation. Oropharynx exam reveals: moderate mouth dryness, adequate dental hygiene and moderate airway crowding, duenarrow airway entry and redundant soft palate. Tonsils are absent. Mallampati is class II. Neck size is 14-1/2 inches. Tongue protrudes centrally and palate elevates symmetrically. She has a mild underbite. Nasal inspection reveals no significant nasal mucosal bogginess or redness and no septal deviation.   Chest: Clear to auscultation without wheezing, rhonchi or crackles noted.  Heart: S1+S2+0, regular and normal without murmurs, rubs or gallops noted.   Abdomen: Soft, non-tender and non-distended with normal bowel sounds appreciated on auscultation.  Extremities: There is 1+ pitting edema in the distal lower extremities bilaterally.  she is wearing knee-high compression stockings bilaterally. Pedal pulses are intact.  Skin: Warm and dry without trophic changes noted. There are no obvious varicose veins, but she does have spider veins.  Musculoskeletal: exam reveals no obvious joint deformities, tenderness or joint swelling or erythema, with the exception of bilateral status post total knee replacement surgeries with unremarkable scars. When moving from the seated to the standing position, there is audible crepitation in the right knee.   Neurologically:  Mental status: The patient is awake, alert and oriented in all 4 spheres. Her immediate and remote memory, attention, language skills and fund of knowledge are appropriate. There is no evidence of aphasia, agnosia, apraxia or anomia. Speech is clear with normal prosody and enunciation. Thought process is linear. Mood is normal and affect is normal.  Cranial nerves II - XII are as described above under HEENT exam. In addition: shoulder shrug is normal with equal shoulder height  noted. Motor exam: Normal bulk, strength and tone is noted. There is no drift, tremor or rebound. Romberg is negative. Reflexes are 2+ throughout. Babinski: Toes are flexor bilaterally. Fine motor skills and coordination: intact with normal finger taps, normal hand movements, normal rapid alternating patting, normal foot taps and normal foot agility.  Cerebellar testing: No dysmetria or intention tremor on finger to nose testing. Heel  to shin is unremarkable bilaterally. There is no truncal or gait ataxia.  Sensory exam: intact to light touch, pinprick, vibration, temperature sense in the upper and lower extremities.  Gait, station and balance: She stands with difficulty. No veering to one side is noted. No leaning to one side is noted. Posture is age-appropriate and stance is narrow based. Gait shows normal stride length and normal pace. No problems turning are noted. She turns in 3 steps. Tandem walk is difficult for her.        Assessment and Plan:   In summary, [name] is a very pleasant 74 y.o.-year old female with an underlying medical history of hyperlipidemia, osteopenia, osteoarthritis, status post left total knee arthroplasty in January 2009, status post cataract surgeries, status post tonsillectomy, status post right total knee arthroplasty in June 2013, vertigo, impaired fasting glucose, allergic rhinitis, glaucoma, depression, restless leg syndrome, macular degeneration, and obesity, who reports snoring and excessive daytime somnolence. Her history and physical exam are somewhat concerning for obstructive sleep apnea (OSA). I had a long chat with the patient about my findings and the diagnosis of OSA, its prognosis and treatment options. We talked about medical treatments, surgical interventions and non-pharmacological approaches. I explained in particular the risks and ramifications of untreated moderate to severe OSA, especially with respect to developing cardiovascular disease down the  Road, including congestive heart failure, difficult to treat hypertension, cardiac arrhythmias, or stroke. Even type 2 diabetes has, in part, been linked to untreated OSA. Symptoms of untreated OSA include daytime sleepiness, memory problems, mood irritability and mood disorder such as depression and anxiety, lack of energy, as well as recurrent headaches, especially morning headaches. We talked about trying to maintain a healthy lifestyle in general, as well as the importance of weight control. I encouraged the patient to eat healthy, exercise daily and keep well hydrated, to keep a scheduled bedtime and wake time routine, to not skip any meals and eat healthy snacks in between meals. I advised the patient not to drive when feeling sleepy. I recommended the following at this time: sleep study with potential positive airway pressure titration. (We will score hypopneas at 4% and split the sleep study into diagnostic and treatment portion, if the estimated. 2 hour AHI is >15/h).   I explained the sleep test procedure to the patient and also outlined possible surgical and non-surgical treatment options of OSA, including the use of a custom-made dental device (which would require a referral to a specialist dentist or oral surgeon), upper airway surgical options, such as pillar implants, radiofrequency surgery, tongue base surgery, and UPPP (which would involve a referral to an ENT surgeon). Rarely, jaw surgery such as mandibular advancement may be considered.  I also explained the CPAP treatment option to the patient, who indicated that she would be willing to try CPAP if the need arises. I explained the importance of being compliant with PAP treatment, not only for insurance purposes but primarily to improve Her symptoms, and for the patient's long term health benefit, including to reduce Her cardiovascular risks. I answered all her questions today and the patient was in agreement. I would like to see her back  after the sleep study is completed and encouraged her to call with any interim questions, concerns, problems or updates.   Thank you very much for allowing me to participate in the care of this nice patient. If I can be of any further assistance to you please do not hesitate to call me  at [phone].  Sincerely,   Star Age, MD, PhD

## 2015-01-09 ENCOUNTER — Ambulatory Visit (INDEPENDENT_AMBULATORY_CARE_PROVIDER_SITE_OTHER): Payer: Medicare HMO | Admitting: Neurology

## 2015-01-09 DIAGNOSIS — R0683 Snoring: Secondary | ICD-10-CM

## 2015-01-09 DIAGNOSIS — G479 Sleep disorder, unspecified: Secondary | ICD-10-CM

## 2015-01-09 DIAGNOSIS — G478 Other sleep disorders: Secondary | ICD-10-CM

## 2015-01-09 DIAGNOSIS — G4761 Periodic limb movement disorder: Secondary | ICD-10-CM

## 2015-01-09 DIAGNOSIS — G4720 Circadian rhythm sleep disorder, unspecified type: Secondary | ICD-10-CM

## 2015-01-09 NOTE — Sleep Study (Signed)
Please see the scanned sleep study interpretation located in the Procedure tab within the Chart Review section. 

## 2015-01-19 ENCOUNTER — Telehealth: Payer: Self-pay | Admitting: Neurology

## 2015-01-19 NOTE — Telephone Encounter (Signed)
Patient seen on 12/08/14, PSG on 01/09/15:  Please call and notify the patient that the recent sleep study did not show any significant obstructive sleep apnea, however, she did not sleep very well and there was sleep disruption from leg twitching, which we call periodic limb movements (seen in patients with RLS). Please inform patient that I would like to go over the details of the study during a follow up appointment and if not already previously scheduled, arrange a followup appointment (please utilize a followu-up slot). Also, route or fax report to PCP and referring MD, if other than PCP.  Once you have spoken to patient, you can close this encounter.   Thanks,  Star Age, MD, PhD [facility])

## 2015-01-20 NOTE — Telephone Encounter (Signed)
Patient is aware of results and we were able to make her a f/u appt for next week. I will fax report to PCP.

## 2015-01-24 ENCOUNTER — Encounter: Payer: Self-pay | Admitting: Neurology

## 2015-01-24 ENCOUNTER — Ambulatory Visit (INDEPENDENT_AMBULATORY_CARE_PROVIDER_SITE_OTHER): Payer: Medicare HMO | Admitting: Neurology

## 2015-01-24 VITALS — BP 122/64 | HR 72 | Resp 16 | Ht 62.0 in | Wt 186.0 lb

## 2015-01-24 DIAGNOSIS — G2581 Restless legs syndrome: Secondary | ICD-10-CM | POA: Diagnosis not present

## 2015-01-24 DIAGNOSIS — G479 Sleep disorder, unspecified: Secondary | ICD-10-CM

## 2015-01-24 DIAGNOSIS — G4761 Periodic limb movement disorder: Secondary | ICD-10-CM

## 2015-01-24 NOTE — Progress Notes (Signed)
Subjective:    Patient ID: [name] is a 74 y.o. female.  HPI     Interim history:   Ms. Deblanc is a 74 year old right-handed woman with an underlying medical history of hyperlipidemia, osteopenia, osteoarthritis, status post left total knee arthroplasty in January 2009, status post cataract surgeries, status post tonsillectomy, status post right total knee arthroplasty in June 2013, vertigo, impaired fasting glucose, allergic rhinitis, glaucoma, depression, restless leg syndrome, macular degeneration, and obesity, who presents for follow-up consultation of her sleep disorder, after her recent sleep study. The patient is unaccompanied today. I first met her on 12/08/2014 at the request of her primary care physician, at which time the patient reported snoring and excessive daytime somnolence. I invited her back for sleep study. She had a baseline sleep study on 01/09/2015 and underwent over her test results with her in detail today. Sleep efficiency was reduced at 62.8% with a latency to sleep prolonged at 120 minutes and wake after sleep onset of 59.5 minutes with mild to moderate sleep fragmentation noted. She had an elevated arousal index, primarily because of periodic leg movements. She had an increased percentage of stage II sleep, a normal percentage of slow-wave sleep and absence of REM sleep. She had severe periodic leg movements at 57.8 per hour, resulting in an arousal index of 8.3 per hour. Mild intermittent snoring was noted. Baseline oxygen saturation was 91% only, nadir was 87%. Time below 88% saturation was 11 seconds only. She had no significant respiratory events. AHI was normal at 0 per hour.  Today, 01/24/2015: She reports that she has intermittent restless leg symptoms. She is not always aware of any nighttime leg twitching. She has had intermittent issues with asthmatic bronchitis and has an inhaler available but does not have to use it every day. She reports that she sleeps a  little better at home. She did have trouble falling asleep during the sleep study and this was because she likes to read before going to sleep and the mattress was not as comfortable. She takes ropinirole 1 mg strength half a pill about twice or 3 times a week. She takes hydrocodone as needed, not daily and usually not more than once per day if needed for right knee pain.  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 She used to smoke. She quit in 1996. She drinks alcohol approximately 4 times a week in the form of wine, approximately 4 ounces each time. She drinks about 1-2 cups of coffee every day and perhaps 1 soda per day at lunch. Her main concern with her sleep is daytime tiredness and somnolence. She will take a prolonged nap usually on Sundays. Sometimes during the weekdays when she is sedentary she will doze off. She goes to bed around midnight. Her husband sleeps in a separate bedroom but they do go to bed at the same time. She snores some. She does not wake herself up with a gasping sensation and he has not mentioned any pauses in her breathing recently but then again they do not typically share the same bedroom. She has restless leg symptoms, especially in the evenings when she is sitting in her chair. This is not a night to night occurrence and occasionally she will take ropinirole. She takes it on on average once a week. She has nocturia once at night if at all. She occasionally has headaches in the mornings. Her rise time is 8 AM. She does not typically feel  very rested. She had a tonsillectomy as a child.   Her Epworth sleepiness score is 8 out of 24 today and her fatigue scores 38 out of 63.  Her Past Medical History Is Significant For: Past Medical History  Diagnosis Date  . Anxiety   . GERD (gastroesophageal reflux disease)   . Hyperlipemia   . Seasonal allergies   . History of blood transfusion ~ 2008    "right after but not related to left knee replacement"   . Lower GI bleeding ~ 2008    "had to have blood transfusion"  . Arthritis     "bilateral knees, shoulders, elbows; pretty widespread"  . Depression   . Sleeping excessive   . Cancer     H/O basal skin cancer 2001  . Osteopenia   . Tinnitus of right ear   . BPPV (benign paroxysmal positional vertigo)   . Glaucoma   . Macular degeneration     Her Past Surgical History Is Significant For: Past Surgical History  Procedure Laterality Date  . Joint replacement      lft knee  . Total knee arthroplasty  ~ 2008; 12/20/11    left; right  . Tonsillectomy and adenoidectomy  1946  . Tubal ligation  1980's  . Cataract extraction w/ intraocular lens  implant, bilateral  1990's  . Total knee arthroplasty  12/20/2011    Procedure: TOTAL KNEE ARTHROPLASTY;  Surgeon: Augustin Schooling, MD;  Location: [facility];  Service: Orthopedics;  Laterality: Right;  Right Total Knee Arthroplasty    Her Family History Is Significant For: Family History  Problem Relation Age of Onset  . Heart attack Mother   . Heart disease Father   . Heart failure Father   . Bladder Cancer Father   . Supraventricular tachycardia Sister   . Hypertension Sister     Her Social History Is Significant For: History   Social History  . Marital Status: Married    Spouse Name: Clair Gulling   . Number of Children: 3  . Years of Education: College   Occupational History  . Retired Pharmacist, hospital    Social History Main Topics  . Smoking status: Former Smoker -- 1.00 packs/day for 20 years    Quit date: 07/02/1995  . Smokeless tobacco: Never Used     Comment: 12/23/11 "quit smoking ~ 15 years ago; smoked about 20 years"  . Alcohol Use: 3.0 oz/week    5 Glasses of wine per week     Comment: 12/23/11 "6 oz wine, 5 days/wk"  . Drug Use: No  . Sexual Activity: Yes   Other Topics Concern  . None   Social History Narrative   1-2 cups of coffee a day     Her Allergies Are:  Allergies  Allergen Reactions  . Morphine And Related Itching   . Cefadroxil Hives    Patient can take amoxicillin and cipro  :   Her Current Medications Are:  Outpatient Encounter Prescriptions as of 01/24/2015  Medication Sig  . albuterol (PROVENTIL HFA;VENTOLIN HFA) 108 (90 BASE) MCG/ACT inhaler Inhale 2 puffs into the lungs daily as needed. For seasonal allergies  . ALPRAZolam (XANAX) 0.25 MG tablet Take 0.25 mg by mouth as needed. For anxiety  . amoxicillin (AMOXIL) 500 MG capsule Take 2,000 mg by mouth See admin instructions. Before a dental visit  . Calcium Citrate-Vitamin D (CALCIUM CITRATE + PO) Take 1,000 mg by mouth daily.  . cholecalciferol (VITAMIN D) 1000 UNITS tablet Take 2,000 Units by  mouth daily.   . dorzolamide (TRUSOPT) 2 % ophthalmic solution INSTILL 1 DROP INTO BOTH EYES DAILY  . HYDROcodone-acetaminophen (NORCO) 5-325 MG per tablet Take 1-2 tablets by mouth every 6 (six) hours as needed. For pain  . Multiple Vitamin (MULTIVITAMIN WITH MINERALS) TABS Take 1 tablet by mouth daily.  Marland Kitchen omeprazole (PRILOSEC) 20 MG capsule Take 20 mg by mouth daily.   . propranolol (INDERAL) 20 MG tablet Take 20 mg by mouth daily.  Marland Kitchen rOPINIRole (REQUIP) 1 MG tablet Take 1 mg by mouth at bedtime.  . sertraline (ZOLOFT) 100 MG tablet Take 100 mg by mouth daily.  . simvastatin (ZOCOR) 20 MG tablet Take 20 mg by mouth daily.  . timolol (BETIMOL) 0.5 % ophthalmic solution Place 1 drop into both eyes daily.   . vitamin C (ASCORBIC ACID) 500 MG tablet Take 1,000 mg by mouth daily.   . [DISCONTINUED] alendronate (FOSAMAX) 70 MG tablet Take 70 mg by mouth once a week. Take with a full glass of water on an empty stomach.  . [DISCONTINUED] dorzolamide-timolol (COSOPT) 22.3-6.8 MG/ML ophthalmic solution INSTILL 1 DROP IN BOTH EYES BID   No facility-administered encounter medications on file as of 01/24/2015.  :  Review of Systems:  Out of a complete 14 point review of systems, all are reviewed and negative with the exception of these symptoms as listed below:    Review of Systems  Constitutional: Positive for fatigue.    Objective:  Neurologic Exam  Physical Exam Physical Examination:   Filed Vitals:   01/24/15 1522  BP: 122/64  Pulse: 72  Resp: 16   General Examination: The patient is a very pleasant 74 y.o. female in no acute distress. She appears well-developed and well-nourished and well groomed. She is in good spirits today.   HEENT: Normocephalic, atraumatic, pupils are equal, round and reactive to light and accommodation. Funduscopic exam is normal with sharp disc margins noted. She is status post bilateral cataract repairs. Extraocular tracking is good without limitation to gaze excursion or nystagmus noted. Normal smooth pursuit is noted. Hearing is grossly intact. Face is symmetric with normal facial animation and normal facial sensation. Speech is clear with no dysarthria noted. There is no hypophonia. There is no lip, neck/head, jaw or voice tremor. Neck is supple with full range of passive and active motion. There are no carotid bruits on auscultation. Oropharynx exam reveals: moderate mouth dryness, adequate dental hygiene and moderate airway crowding, duenarrow airway entry and redundant soft palate. Tonsils are absent. Mallampati is class II. Tongue protrudes centrally and palate elevates symmetrically. She has a mild underbite. Nasal inspection reveals no significant nasal mucosal bogginess or redness and no septal deviation.   Chest: Clear to auscultation without wheezing, rhonchi or crackles noted.  Heart: S1+S2+0, regular and normal without murmurs, rubs or gallops noted.   Abdomen: Soft, non-tender and non-distended with normal bowel sounds appreciated on auscultation.  Extremities: There is trace pitting edema in the distal lower extremities bilaterally.  she is wearing knee-high compression stockings bilaterally. Pedal pulses are intact.  Skin: Warm and dry without trophic changes noted. There are no obvious varicose  veins, but she does have spider veins.  Musculoskeletal: exam reveals no obvious joint deformities, tenderness or joint swelling or erythema, with the exception of bilateral status post total knee replacement surgeries with unremarkable scars. When moving from the seated to the standing position, there is audible crepitation in the right knee, again noted today.  Neurologically:  Mental status: The  patient is awake, alert and oriented in all 4 spheres. Her immediate and remote memory, attention, language skills and fund of knowledge are appropriate. There is no evidence of aphasia, agnosia, apraxia or anomia. Speech is clear with normal prosody and enunciation. Thought process is linear. Mood is normal and affect is normal.  Cranial nerves II - XII are as described above under HEENT exam. In addition: shoulder shrug is normal with equal shoulder height noted. Motor exam: Normal bulk, strength and tone is noted. There is no drift, tremor or rebound. Romberg is negative. Reflexes are 2+ throughout. Babinski: Toes are flexor bilaterally. Fine motor skills and coordination: intact with normal finger taps, normal hand movements, normal rapid alternating patting, normal foot taps and normal foot agility.  Cerebellar testing: No dysmetria or intention tremor on finger to nose testing. Heel to shin is unremarkable bilaterally. There is no truncal or gait ataxia.  Sensory exam: intact to light touch in the upper and lower extremities.  Gait, station and balance: She stands with difficulty. No veering to one side is noted. No leaning to one side is noted. Posture is age-appropriate and stance is narrow based. Gait shows normal stride length and normal pace. No problems turning are noted. She turns in 3 steps.       Assessment and Plan:   In summary, ADONAI HELZER is a very pleasant 74 year old female with an underlying medical history of hyperlipidemia, osteopenia, osteoarthritis, status post left total knee  arthroplasty in January 2009, status post cataract surgeries, status post tonsillectomy, status post right total knee arthroplasty in June 2013, vertigo, impaired fasting glucose, allergic rhinitis, glaucoma, depression, restless leg syndrome, macular degeneration, and obesity, who presents for follow-up consultation after her recent sleep study. We talked about her baseline sleep study from07/05/2015 in detail today.thankfully, she does not have any significant sleep disordered breathing. Nevertheless, her baseline oxygen saturations were in the lower 90s. Trying to sleep off her back and weight loss may help. In addition, she does have a history of asthmatic bronchitis. She does not endorse any shortness of breath or heart disease. She did have significant periodic leg movements with arousals. She does endorse restless leg symptoms. Her physical exam is stable, her edema is improved. Today, I suggested that she take ropinirole 1 mg strength half a pill each night to see if this helps consolidate her sleep and give her more restful sleep and therefore reduce her daytime somnolence. She is encouraged to follow-up with her primary care physician if she has any additional issues with wheezing or shortness of breath. She seems to be stable in that regard. She is encouraged to try to lose weight. I answered all her questions today and the patient was in agreement. I would like to see her back in about 6 months, sooner if needed.  I spent 20 minutes in total face-to-face time with the patient, more than 50% of which was spent in counseling and coordination of care, reviewing test results, reviewing medication and discussing or reviewing the diagnosis of PLMD and RLS, the prognosis and treatment options.

## 2015-01-24 NOTE — Patient Instructions (Signed)
You have leg twitching in your sleep in keeping with periodic leg movements and see in patients with restless legs syndrome.  Please take ropinirole 1 mg, 1/2 pill each night. Take 90-120 minutes before projected bedtime. Try to sleep on your sides and lose weight. Thankfully, you do not have obstructive sleep apnea.

## 2015-06-27 DIAGNOSIS — H401132 Primary open-angle glaucoma, bilateral, moderate stage: Secondary | ICD-10-CM | POA: Diagnosis not present

## 2015-06-27 DIAGNOSIS — H04123 Dry eye syndrome of bilateral lacrimal glands: Secondary | ICD-10-CM | POA: Diagnosis not present

## 2015-07-25 ENCOUNTER — Ambulatory Visit: Payer: Medicare HMO | Admitting: Neurology

## 2015-09-14 DIAGNOSIS — Z1231 Encounter for screening mammogram for malignant neoplasm of breast: Secondary | ICD-10-CM | POA: Diagnosis not present

## 2015-10-25 DIAGNOSIS — H353131 Nonexudative age-related macular degeneration, bilateral, early dry stage: Secondary | ICD-10-CM | POA: Diagnosis not present

## 2015-10-25 DIAGNOSIS — H401132 Primary open-angle glaucoma, bilateral, moderate stage: Secondary | ICD-10-CM | POA: Diagnosis not present

## 2015-10-25 DIAGNOSIS — H04123 Dry eye syndrome of bilateral lacrimal glands: Secondary | ICD-10-CM | POA: Diagnosis not present

## 2015-11-09 DIAGNOSIS — Z0000 Encounter for general adult medical examination without abnormal findings: Secondary | ICD-10-CM | POA: Diagnosis not present

## 2015-11-09 DIAGNOSIS — E7800 Pure hypercholesterolemia, unspecified: Secondary | ICD-10-CM | POA: Diagnosis not present

## 2015-11-09 DIAGNOSIS — K219 Gastro-esophageal reflux disease without esophagitis: Secondary | ICD-10-CM | POA: Diagnosis not present

## 2015-11-09 DIAGNOSIS — R69 Illness, unspecified: Secondary | ICD-10-CM | POA: Diagnosis not present

## 2015-11-09 DIAGNOSIS — I10 Essential (primary) hypertension: Secondary | ICD-10-CM | POA: Diagnosis not present

## 2015-11-22 DIAGNOSIS — Z79899 Other long term (current) drug therapy: Secondary | ICD-10-CM | POA: Diagnosis not present

## 2015-11-22 DIAGNOSIS — E559 Vitamin D deficiency, unspecified: Secondary | ICD-10-CM | POA: Diagnosis not present

## 2015-11-22 DIAGNOSIS — R7301 Impaired fasting glucose: Secondary | ICD-10-CM | POA: Diagnosis not present

## 2015-11-22 DIAGNOSIS — R8290 Unspecified abnormal findings in urine: Secondary | ICD-10-CM | POA: Diagnosis not present

## 2015-11-22 DIAGNOSIS — E784 Other hyperlipidemia: Secondary | ICD-10-CM | POA: Diagnosis not present

## 2015-11-22 DIAGNOSIS — N390 Urinary tract infection, site not specified: Secondary | ICD-10-CM | POA: Diagnosis not present

## 2015-11-29 DIAGNOSIS — R002 Palpitations: Secondary | ICD-10-CM | POA: Diagnosis not present

## 2015-11-29 DIAGNOSIS — R7301 Impaired fasting glucose: Secondary | ICD-10-CM | POA: Diagnosis not present

## 2015-11-29 DIAGNOSIS — I872 Venous insufficiency (chronic) (peripheral): Secondary | ICD-10-CM | POA: Diagnosis not present

## 2015-11-29 DIAGNOSIS — N390 Urinary tract infection, site not specified: Secondary | ICD-10-CM | POA: Diagnosis not present

## 2015-11-29 DIAGNOSIS — G2581 Restless legs syndrome: Secondary | ICD-10-CM | POA: Diagnosis not present

## 2015-11-29 DIAGNOSIS — E784 Other hyperlipidemia: Secondary | ICD-10-CM | POA: Diagnosis not present

## 2015-11-29 DIAGNOSIS — Z0000 Encounter for general adult medical examination without abnormal findings: Secondary | ICD-10-CM | POA: Diagnosis not present

## 2015-11-29 DIAGNOSIS — R2689 Other abnormalities of gait and mobility: Secondary | ICD-10-CM | POA: Diagnosis not present

## 2015-11-29 DIAGNOSIS — R69 Illness, unspecified: Secondary | ICD-10-CM | POA: Diagnosis not present

## 2016-02-01 DIAGNOSIS — L603 Nail dystrophy: Secondary | ICD-10-CM | POA: Diagnosis not present

## 2016-02-01 DIAGNOSIS — C44311 Basal cell carcinoma of skin of nose: Secondary | ICD-10-CM | POA: Diagnosis not present

## 2016-02-01 DIAGNOSIS — L570 Actinic keratosis: Secondary | ICD-10-CM | POA: Diagnosis not present

## 2016-02-01 DIAGNOSIS — D239 Other benign neoplasm of skin, unspecified: Secondary | ICD-10-CM | POA: Diagnosis not present

## 2016-03-07 DIAGNOSIS — C44311 Basal cell carcinoma of skin of nose: Secondary | ICD-10-CM | POA: Diagnosis not present

## 2016-03-16 DIAGNOSIS — Z23 Encounter for immunization: Secondary | ICD-10-CM | POA: Diagnosis not present

## 2016-04-03 DIAGNOSIS — H5211 Myopia, right eye: Secondary | ICD-10-CM | POA: Diagnosis not present

## 2016-04-03 DIAGNOSIS — Z0100 Encounter for examination of eyes and vision without abnormal findings: Secondary | ICD-10-CM | POA: Diagnosis not present

## 2016-05-27 DIAGNOSIS — R69 Illness, unspecified: Secondary | ICD-10-CM | POA: Diagnosis not present

## 2016-06-03 DIAGNOSIS — L738 Other specified follicular disorders: Secondary | ICD-10-CM | POA: Diagnosis not present

## 2016-06-03 DIAGNOSIS — D239 Other benign neoplasm of skin, unspecified: Secondary | ICD-10-CM | POA: Diagnosis not present

## 2016-07-22 DIAGNOSIS — G2581 Restless legs syndrome: Secondary | ICD-10-CM | POA: Diagnosis not present

## 2016-07-22 DIAGNOSIS — R002 Palpitations: Secondary | ICD-10-CM | POA: Diagnosis not present

## 2016-07-22 DIAGNOSIS — H3530 Unspecified macular degeneration: Secondary | ICD-10-CM | POA: Diagnosis not present

## 2016-07-22 DIAGNOSIS — Z6834 Body mass index (BMI) 34.0-34.9, adult: Secondary | ICD-10-CM | POA: Diagnosis not present

## 2016-07-22 DIAGNOSIS — R600 Localized edema: Secondary | ICD-10-CM | POA: Diagnosis not present

## 2016-07-22 DIAGNOSIS — H9113 Presbycusis, bilateral: Secondary | ICD-10-CM | POA: Diagnosis not present

## 2016-07-22 DIAGNOSIS — Z79899 Other long term (current) drug therapy: Secondary | ICD-10-CM | POA: Diagnosis not present

## 2016-07-22 DIAGNOSIS — E7800 Pure hypercholesterolemia, unspecified: Secondary | ICD-10-CM | POA: Diagnosis not present

## 2016-07-22 DIAGNOSIS — M2391 Unspecified internal derangement of right knee: Secondary | ICD-10-CM | POA: Diagnosis not present

## 2016-07-22 DIAGNOSIS — H409 Unspecified glaucoma: Secondary | ICD-10-CM | POA: Diagnosis not present

## 2016-07-22 DIAGNOSIS — R0600 Dyspnea, unspecified: Secondary | ICD-10-CM | POA: Diagnosis not present

## 2016-07-22 DIAGNOSIS — M159 Polyosteoarthritis, unspecified: Secondary | ICD-10-CM | POA: Diagnosis not present

## 2016-07-22 DIAGNOSIS — R69 Illness, unspecified: Secondary | ICD-10-CM | POA: Diagnosis not present

## 2016-07-22 DIAGNOSIS — Z0000 Encounter for general adult medical examination without abnormal findings: Secondary | ICD-10-CM | POA: Diagnosis not present

## 2016-07-22 DIAGNOSIS — R32 Unspecified urinary incontinence: Secondary | ICD-10-CM | POA: Diagnosis not present

## 2016-07-22 DIAGNOSIS — Z87891 Personal history of nicotine dependence: Secondary | ICD-10-CM | POA: Diagnosis not present

## 2016-07-22 DIAGNOSIS — E669 Obesity, unspecified: Secondary | ICD-10-CM | POA: Diagnosis not present

## 2016-07-22 DIAGNOSIS — H9319 Tinnitus, unspecified ear: Secondary | ICD-10-CM | POA: Diagnosis not present

## 2016-07-23 DIAGNOSIS — M859 Disorder of bone density and structure, unspecified: Secondary | ICD-10-CM | POA: Diagnosis not present

## 2016-09-11 DIAGNOSIS — H353131 Nonexudative age-related macular degeneration, bilateral, early dry stage: Secondary | ICD-10-CM | POA: Diagnosis not present

## 2016-09-11 DIAGNOSIS — H35363 Drusen (degenerative) of macula, bilateral: Secondary | ICD-10-CM | POA: Diagnosis not present

## 2016-09-11 DIAGNOSIS — H4720 Unspecified optic atrophy: Secondary | ICD-10-CM | POA: Diagnosis not present

## 2016-09-11 DIAGNOSIS — H401133 Primary open-angle glaucoma, bilateral, severe stage: Secondary | ICD-10-CM | POA: Diagnosis not present

## 2016-10-02 DIAGNOSIS — H353132 Nonexudative age-related macular degeneration, bilateral, intermediate dry stage: Secondary | ICD-10-CM | POA: Diagnosis not present

## 2016-10-02 DIAGNOSIS — H401132 Primary open-angle glaucoma, bilateral, moderate stage: Secondary | ICD-10-CM | POA: Diagnosis not present

## 2016-10-02 DIAGNOSIS — H04123 Dry eye syndrome of bilateral lacrimal glands: Secondary | ICD-10-CM | POA: Diagnosis not present

## 2016-10-10 DIAGNOSIS — Z1231 Encounter for screening mammogram for malignant neoplasm of breast: Secondary | ICD-10-CM | POA: Diagnosis not present

## 2016-10-14 ENCOUNTER — Observation Stay (HOSPITAL_COMMUNITY)
Admission: EM | Admit: 2016-10-14 | Discharge: 2016-10-16 | Disposition: A | Discharge status: Home / Self Care | Payer: Medicare HMO | Attending: Family Medicine | Admitting: Family Medicine

## 2016-10-14 ENCOUNTER — Encounter (HOSPITAL_COMMUNITY): Payer: Self-pay | Admitting: Emergency Medicine

## 2016-10-14 DIAGNOSIS — H409 Unspecified glaucoma: Secondary | ICD-10-CM | POA: Insufficient documentation

## 2016-10-14 DIAGNOSIS — K5731 Diverticulosis of large intestine without perforation or abscess with bleeding: Secondary | ICD-10-CM | POA: Diagnosis not present

## 2016-10-14 DIAGNOSIS — Z8249 Family history of ischemic heart disease and other diseases of the circulatory system: Secondary | ICD-10-CM | POA: Diagnosis not present

## 2016-10-14 DIAGNOSIS — R109 Unspecified abdominal pain: Secondary | ICD-10-CM

## 2016-10-14 DIAGNOSIS — Z885 Allergy status to narcotic agent status: Secondary | ICD-10-CM | POA: Insufficient documentation

## 2016-10-14 DIAGNOSIS — Z9842 Cataract extraction status, left eye: Secondary | ICD-10-CM | POA: Diagnosis not present

## 2016-10-14 DIAGNOSIS — H8110 Benign paroxysmal vertigo, unspecified ear: Secondary | ICD-10-CM | POA: Diagnosis not present

## 2016-10-14 DIAGNOSIS — E785 Hyperlipidemia, unspecified: Secondary | ICD-10-CM | POA: Insufficient documentation

## 2016-10-14 DIAGNOSIS — F419 Anxiety disorder, unspecified: Secondary | ICD-10-CM | POA: Diagnosis present

## 2016-10-14 DIAGNOSIS — Z880 Allergy status to penicillin: Secondary | ICD-10-CM | POA: Insufficient documentation

## 2016-10-14 DIAGNOSIS — Z8052 Family history of malignant neoplasm of bladder: Secondary | ICD-10-CM | POA: Diagnosis not present

## 2016-10-14 DIAGNOSIS — K5730 Diverticulosis of large intestine without perforation or abscess without bleeding: Secondary | ICD-10-CM | POA: Diagnosis not present

## 2016-10-14 DIAGNOSIS — Z79899 Other long term (current) drug therapy: Secondary | ICD-10-CM | POA: Insufficient documentation

## 2016-10-14 DIAGNOSIS — Z85828 Personal history of other malignant neoplasm of skin: Secondary | ICD-10-CM | POA: Diagnosis not present

## 2016-10-14 DIAGNOSIS — K219 Gastro-esophageal reflux disease without esophagitis: Secondary | ICD-10-CM | POA: Insufficient documentation

## 2016-10-14 DIAGNOSIS — M170 Bilateral primary osteoarthritis of knee: Secondary | ICD-10-CM | POA: Insufficient documentation

## 2016-10-14 DIAGNOSIS — I10 Essential (primary) hypertension: Secondary | ICD-10-CM | POA: Diagnosis not present

## 2016-10-14 DIAGNOSIS — K922 Gastrointestinal hemorrhage, unspecified: Secondary | ICD-10-CM | POA: Diagnosis not present

## 2016-10-14 DIAGNOSIS — H9311 Tinnitus, right ear: Secondary | ICD-10-CM | POA: Diagnosis not present

## 2016-10-14 DIAGNOSIS — H3530 Unspecified macular degeneration: Secondary | ICD-10-CM | POA: Insufficient documentation

## 2016-10-14 DIAGNOSIS — Z9841 Cataract extraction status, right eye: Secondary | ICD-10-CM | POA: Diagnosis not present

## 2016-10-14 DIAGNOSIS — F319 Bipolar disorder, unspecified: Secondary | ICD-10-CM | POA: Diagnosis not present

## 2016-10-14 DIAGNOSIS — K5909 Other constipation: Secondary | ICD-10-CM | POA: Diagnosis not present

## 2016-10-14 DIAGNOSIS — D649 Anemia, unspecified: Secondary | ICD-10-CM | POA: Diagnosis not present

## 2016-10-14 DIAGNOSIS — K921 Melena: Secondary | ICD-10-CM | POA: Diagnosis not present

## 2016-10-14 DIAGNOSIS — M8580 Other specified disorders of bone density and structure, unspecified site: Secondary | ICD-10-CM | POA: Insufficient documentation

## 2016-10-14 DIAGNOSIS — Z96653 Presence of artificial knee joint, bilateral: Secondary | ICD-10-CM | POA: Insufficient documentation

## 2016-10-14 DIAGNOSIS — D501 Sideropenic dysphagia: Secondary | ICD-10-CM | POA: Diagnosis not present

## 2016-10-14 DIAGNOSIS — Z888 Allergy status to other drugs, medicaments and biological substances status: Secondary | ICD-10-CM | POA: Insufficient documentation

## 2016-10-14 DIAGNOSIS — D500 Iron deficiency anemia secondary to blood loss (chronic): Secondary | ICD-10-CM | POA: Diagnosis not present

## 2016-10-14 DIAGNOSIS — R69 Illness, unspecified: Secondary | ICD-10-CM | POA: Diagnosis not present

## 2016-10-14 LAB — COMPREHENSIVE METABOLIC PANEL
ALBUMIN: 3.6 g/dL (ref 3.5–5.0)
ALK PHOS: 57 U/L (ref 38–126)
ALT: 12 U/L — AB (ref 14–54)
AST: 19 U/L (ref 15–41)
Anion gap: 7 (ref 5–15)
BUN: 16 mg/dL (ref 6–20)
CALCIUM: 8.8 mg/dL — AB (ref 8.9–10.3)
CO2: 24 mmol/L (ref 22–32)
CREATININE: 0.51 mg/dL (ref 0.44–1.00)
Chloride: 109 mmol/L (ref 101–111)
GFR calc Af Amer: >60 mL/min (ref >60)
GFR calc non Af Amer: >60 mL/min (ref >60)
GLUCOSE: 119 mg/dL — AB (ref 65–99)
Potassium: 3.6 mmol/L (ref 3.5–5.1)
SODIUM: 140 mmol/L (ref 135–145)
Total Bilirubin: 0.5 mg/dL (ref 0.3–1.2)
Total Protein: 5.9 g/dL — ABNORMAL LOW (ref 6.5–8.1)

## 2016-10-14 LAB — TYPE AND SCREEN
ABO/RH(D): O POS
Antibody Screen: NEGATIVE

## 2016-10-14 LAB — CBC
HCT: 33.7 % — ABNORMAL LOW (ref 36.0–46.0)
Hemoglobin: 10.8 g/dL — ABNORMAL LOW (ref 12.0–15.0)
MCH: 30.3 pg (ref 26.0–34.0)
MCHC: 32 g/dL (ref 30.0–36.0)
MCV: 94.4 fL (ref 78.0–100.0)
PLATELETS: 153 10*3/uL (ref 150–400)
RBC: 3.57 MIL/uL — ABNORMAL LOW (ref 3.87–5.11)
RDW: 13.7 % (ref 11.5–15.5)
WBC: 8.1 10*3/uL (ref 4.0–10.5)

## 2016-10-14 LAB — POC OCCULT BLOOD, ED: FECAL OCCULT BLD: POSITIVE — AB

## 2016-10-14 MED ORDER — PANTOPRAZOLE SODIUM 40 MG IV SOLR
40.0000 mg | Freq: Every day | INTRAVENOUS | Status: DC
  Administered 2016-10-15 (×2): 40 mg via INTRAVENOUS
  Filled 2016-10-14 (×2): qty 40

## 2016-10-14 MED ORDER — ACETAMINOPHEN 650 MG RE SUPP: 650.0000 mg | Freq: Four times a day (QID) | RECTAL | Status: DC | PRN

## 2016-10-14 MED ORDER — DORZOLAMIDE HCL 2 % OP SOLN: 1.0000 [drp] | Freq: Every morning | OPHTHALMIC | Status: DC

## 2016-10-14 MED ORDER — SODIUM CHLORIDE 0.9% FLUSH
3.0000 mL | Freq: Two times a day (BID) | INTRAVENOUS | Status: DC
  Administered 2016-10-15 – 2016-10-16 (×3): 3 mL via INTRAVENOUS

## 2016-10-14 MED ORDER — HYDROCODONE-ACETAMINOPHEN 5-325 MG PO TABS: 1.0000 | ORAL_TABLET | Freq: Four times a day (QID) | ORAL | Status: DC | PRN

## 2016-10-14 MED ORDER — DORZOLAMIDE HCL-TIMOLOL MAL 2-0.5 % OP SOLN
1.0000 [drp] | Freq: Two times a day (BID) | OPHTHALMIC | Status: DC
  Administered 2016-10-15 – 2016-10-16 (×2): 1 [drp] via OPHTHALMIC
  Filled 2016-10-14: qty 10

## 2016-10-14 MED ORDER — ALPRAZOLAM 0.25 MG PO TABS
0.2500 mg | ORAL_TABLET | Freq: Three times a day (TID) | ORAL | Status: DC | PRN
  Administered 2016-10-15: 0.25 mg via ORAL
  Filled 2016-10-14: qty 1

## 2016-10-14 MED ORDER — PROPRANOLOL HCL 20 MG PO TABS
20.0000 mg | ORAL_TABLET | Freq: Every day | ORAL | Status: DC
  Administered 2016-10-15 – 2016-10-16 (×2): 20 mg via ORAL
  Filled 2016-10-14 (×2): qty 1

## 2016-10-14 MED ORDER — ADULT MULTIVITAMIN W/MINERALS CH
1.0000 | ORAL_TABLET | Freq: Every day | ORAL | Status: DC
  Administered 2016-10-15 – 2016-10-16 (×2): 1 via ORAL
  Filled 2016-10-14 (×2): qty 1

## 2016-10-14 MED ORDER — ACETAMINOPHEN 325 MG PO TABS
650.0000 mg | ORAL_TABLET | Freq: Four times a day (QID) | ORAL | Status: DC | PRN
  Administered 2016-10-16: 650 mg via ORAL
  Filled 2016-10-14: qty 2

## 2016-10-14 MED ORDER — ONDANSETRON HCL 4 MG PO TABS: 4.0000 mg | ORAL_TABLET | Freq: Four times a day (QID) | ORAL | Status: DC | PRN

## 2016-10-14 MED ORDER — SIMVASTATIN 20 MG PO TABS
20.0000 mg | ORAL_TABLET | Freq: Every day | ORAL | Status: DC
  Administered 2016-10-15 – 2016-10-16 (×2): 20 mg via ORAL
  Filled 2016-10-14 (×2): qty 1

## 2016-10-14 MED ORDER — ONDANSETRON HCL 4 MG/2ML IJ SOLN: 4.0000 mg | Freq: Four times a day (QID) | INTRAMUSCULAR | Status: DC | PRN

## 2016-10-14 MED ORDER — SODIUM CHLORIDE 0.9 % IV SOLN
INTRAVENOUS | Status: AC
  Administered 2016-10-15: 01:00:00 via INTRAVENOUS

## 2016-10-14 MED ORDER — SERTRALINE HCL 100 MG PO TABS
100.0000 mg | ORAL_TABLET | Freq: Every day | ORAL | Status: DC
  Administered 2016-10-15 – 2016-10-16 (×2): 100 mg via ORAL
  Filled 2016-10-14 (×2): qty 1

## 2016-10-14 MED ORDER — ALBUTEROL SULFATE (2.5 MG/3ML) 0.083% IN NEBU: 2.5000 mg | INHALATION_SOLUTION | Freq: Every day | RESPIRATORY_TRACT | Status: DC | PRN

## 2016-10-14 MED ORDER — CALCIUM CARBONATE 1250 (500 CA) MG PO TABS
2.0000 | ORAL_TABLET | Freq: Every day | ORAL | Status: DC
  Administered 2016-10-16: 1000 mg via ORAL
  Filled 2016-10-14: qty 1

## 2016-10-14 NOTE — ED Triage Notes (Signed)
Pt. Stated, I started having blood in my stool last night after supper and it has continued. It was bright red and dark red. Denies any pain assc. With it.

## 2016-10-14 NOTE — ED Provider Notes (Signed)
[facility] DEPT Provider Note   CSN: [id number] Arrival date & time: 10/14/16  1716     History   Chief Complaint Chief Complaint  Patient presents with  . GI Bleeding    HPI [name] is a 76 y.o. female.  The history is provided by the patient.  Rectal Bleeding  Quality:  Maroon Amount:  Moderate Duration:  1 day Timing:  Intermittent Chronicity:  New Context: spontaneously   Context: not diarrhea and not rectal pain   Similar prior episodes: yes   Relieved by:  Nothing Worsened by:  Nothing Ineffective treatments:  None tried Associated symptoms: no abdominal pain, no fever and no light-headedness    76 year old female who presents with GI bleeding. She has a history of prior lower GI bleeding in the 2000, but despite colonoscopy and additional studies there is no clear etiology of her bleeding. She is not anticoagulated and reports being in her usual state of health until yesterday evening. She began having multiple episodes of maroon-colored stools. She estimates potentially 6-7 episodes. She has been feeling fatigued, but no chest pain, difficulty breathing, syncope or near-syncope. No abdominal pain. Does think later on this evening feedings bleeding slightly improved.  Past Medical History:  Diagnosis Date  . Anxiety   . Arthritis    "bilateral knees, shoulders, elbows; pretty widespread"  . BPPV (benign paroxysmal positional vertigo)   . Cancer ([place])    H/O basal skin cancer 2001  . Depression   . GERD (gastroesophageal reflux disease)   . Glaucoma   . History of blood transfusion ~ 2008   "right after but not related to left knee replacement"  . Hyperlipemia   . Lower GI bleeding ~ 2008   "had to have blood transfusion"  . Macular degeneration   . Osteopenia   . Seasonal allergies   . Sleeping excessive   . Tinnitus of right ear     Patient Active Problem List   Diagnosis Date Noted  . Lower GI bleeding 10/14/2016  . Diverticulosis of  colon 10/14/2016  . Anxiety 10/14/2016  . Lower GI bleed 10/14/2016  . Osteoarthrosis, unspecified whether generalized or localized, lower leg 12/20/2011    Past Surgical History:  Procedure Laterality Date  . CATARACT EXTRACTION W/ INTRAOCULAR LENS  IMPLANT, BILATERAL  1990's  . JOINT REPLACEMENT     lft knee  . TONSILLECTOMY AND ADENOIDECTOMY  1946  . TOTAL KNEE ARTHROPLASTY  ~ 2008; 12/20/11   left; right  . TOTAL KNEE ARTHROPLASTY  12/20/2011   Procedure: TOTAL KNEE ARTHROPLASTY;  Surgeon: Augustin Schooling, MD;  Location: [facility];  Service: Orthopedics;  Laterality: Right;  Right Total Knee Arthroplasty  . TUBAL LIGATION  1980's    OB History    No data available       Home Medications    Prior to Admission medications   Medication Sig Start Date End Date Taking? Authorizing Provider  albuterol (PROVENTIL HFA;VENTOLIN HFA) 108 (90 BASE) MCG/ACT inhaler Inhale 2 puffs into the lungs daily as needed. For seasonal allergies   Yes Historical Provider, MD  ALPRAZolam (XANAX) 0.25 MG tablet Take 0.25 mg by mouth daily as needed for anxiety.    Yes Historical Provider, MD  amoxicillin (AMOXIL) 500 MG capsule Take 2,000 mg by mouth See admin instructions. Before a dental visit   Yes Historical Provider, MD  Calcium Citrate-Vitamin D (CALCIUM CITRATE + PO) Take 1,000 mg by mouth daily.   Yes Historical Provider, MD  cholecalciferol (VITAMIN D) 1000 UNITS tablet Take 2,000 Units by mouth 2 (two) times daily.    Yes Historical Provider, MD  dorzolamide (TRUSOPT) 2 % ophthalmic solution INSTILL 1 DROP INTO BOTH EYES DAILY 01/16/15  Yes Historical Provider, MD  dorzolamide-timolol (COSOPT) 22.3-6.8 MG/ML ophthalmic solution Place 1 drop into both eyes 2 (two) times daily.   Yes Historical Provider, MD  HYDROcodone-acetaminophen (NORCO) 5-325 MG per tablet Take 1-2 tablets by mouth every 6 (six) hours as needed for moderate pain. For pain   Yes Historical Provider, MD  Multiple Vitamin  (MULTIVITAMIN WITH MINERALS) TABS Take 1 tablet by mouth daily.   Yes Historical Provider, MD  Multiple Vitamins-Minerals (ICAPS AREDS 2 PO) Take 1 capsule by mouth 2 (two) times daily.   Yes Historical Provider, MD  omeprazole (PRILOSEC) 20 MG capsule Take 20 mg by mouth daily.    Yes Historical Provider, MD  propranolol (INDERAL) 20 MG tablet Take 20 mg by mouth daily.   Yes Historical Provider, MD  sertraline (ZOLOFT) 100 MG tablet Take 100 mg by mouth daily.   Yes Historical Provider, MD  simvastatin (ZOCOR) 20 MG tablet Take 20 mg by mouth daily.   Yes Historical Provider, MD  vitamin C (ASCORBIC ACID) 500 MG tablet Take 1,000 mg by mouth daily.    Yes Historical Provider, MD    Family History Family History  Problem Relation Age of Onset  . Heart attack Mother   . Heart disease Father   . Heart failure Father   . Bladder Cancer Father   . Supraventricular tachycardia Sister   . Hypertension Sister     Social History Social History  Substance Use Topics  . Smoking status: Former Smoker    Packs/day: 1.00    Years: 20.00    Quit date: 07/02/1995  . Smokeless tobacco: Never Used     Comment: 12/23/11 "quit smoking ~ 15 years ago; smoked about 20 years"  . Alcohol use 3.0 oz/week    5 Glasses of wine per week     Comment: 12/23/11 "6 oz wine, 5 days/wk"     Allergies   Morphine and related and Cefadroxil   Review of Systems Review of Systems  Constitutional: Negative for fever.  Respiratory: Negative for shortness of breath.   Cardiovascular: Negative for chest pain.  Gastrointestinal: Positive for hematochezia. Negative for abdominal pain.  Genitourinary: Negative for difficulty urinating.  Allergic/Immunologic: Negative for immunocompromised state.  Neurological: Negative for syncope and light-headedness.  Hematological: Does not bruise/bleed easily.  All other systems reviewed and are negative.    Physical Exam Updated Vital Signs BP 137/65 (BP Location: Left  Arm)   Pulse 74   Temp 98.1 F (36.7 C) (Oral)   Resp 17   Ht 5' 2.5" (1.588 m)   Wt 194 lb (88 kg)   SpO2 97%   BMI 34.92 kg/m   Physical Exam Physical Exam  Nursing note and vitals reviewed. Constitutional: Well developed, well nourished, non-toxic, and in no acute distress Head: Normocephalic and atraumatic.  Mouth/Throat: Oropharynx is clear and moist.  Neck: Normal range of motion. Neck supple.  Cardiovascular: Normal rate and regular rhythm.   Pulmonary/Chest: Effort normal and breath sounds normal.  Abdominal: Soft. There is no tenderness. There is no rebound and no guarding.  hematochezia noted on rectal exam  Musculoskeletal: Normal range of motion.  Neurological: Alert, no facial droop, fluent speech, moves all extremities symmetrically Skin: Skin is warm and dry.  Psychiatric: Cooperative  ED Treatments / Results  Labs (all labs ordered are listed, but only abnormal results are displayed) Labs Reviewed  COMPREHENSIVE METABOLIC PANEL - Abnormal; Notable for the following:       Result Value   Glucose, Bld 119 (*)    Calcium 8.8 (*)    Total Protein 5.9 (*)    ALT 12 (*)    All other components within normal limits  CBC - Abnormal; Notable for the following:    RBC 3.57 (*)    Hemoglobin 10.8 (*)    HCT 33.7 (*)    All other components within normal limits  POC OCCULT BLOOD, ED - Abnormal; Notable for the following:    Fecal Occult Bld POSITIVE (*)    All other components within normal limits  TYPE AND SCREEN    EKG  EKG Interpretation None       Radiology No results found.  Procedures Procedures (including critical care time)  Medications Ordered in ED Medications - No data to display   Initial Impression / Assessment and Plan / ED Course  I have reviewed the triage vital signs and the nursing notes.  Pertinent labs & imaging results that were available during my care of the patient were reviewed by me and considered in my medical  decision making (see chart for details).     History of lower GI bleeding who presents with recurrent lower GI bleed. Eyes nontoxic in no acute distress with normal vital signs. Has a soft and benign abdomen. Hematochezia is noted on rectal exam. She has a stable hemoglobin of 10.8. Given her active bleeding, we'll admit for serial hemoglobins and monitoring. Discussed with Dr. Hilarie Fredrickson from GI who will see patient for consult. Patient admitted by Dr. Myna Hidalgo.  Final Clinical Impressions(s) / ED Diagnoses   Final diagnoses:  Acute GI bleeding  Lower GI bleed    New Prescriptions New Prescriptions   No medications on file     Forde Dandy, MD 10/14/16 2306

## 2016-10-14 NOTE — H&P (Signed)
History and Physical    [name] OVF:[id number] DOB: [birth year] DOA: 10/14/2016  PCP: Jerlyn Ly, MD   Patient coming from: Home  Chief Complaint: rectal bleeding   HPI: [name] is a 76 y.o. female with medical history significant for anxiety, osteoarthritis status post total knee replacement, diverticulosis, and history of lower GI bleed requiring transfusion who now presents to the emergency department with rectal bleeding. Patient reports that she had been in her usual state of health until yesterday evening when she developed an acute sensation that she needed to have diarrhea, went to the toilet, and passed bright red blood mixed with some dark red blood. There was no pain associated with this and she has not experiencing any nausea or indigestion. She went on to have another 6-7 episodes overnight and today, also with bright red and dark blood. She denies any chest pain or palpitations and denies any lightheadedness. There has not been any recent fevers or chills, and no dyspnea or cough. She is not anticoagulated and no longer takes a daily aspirin. She describes her symptoms as very similar to those she experienced in 2009 when she required transfusion for lower GI bleed with nonrevealing tagged red blood cell scan and colonoscopy with extensive diverticular disease, but no active bleeding. There has not been any melena or hematochezia since that time until now. Patient denies any hematuria or any other easy bruising or bleeding.  ED Course: Upon arrival to the ED, patient is found to be afebrile, saturating well on room air, and with vital signs stable. Chemistry panel is unremarkable and CBC is notable for a hemoglobin of 10.8 which is consistent with her most recent priors from 2013. Fecal occult blood testing is positive. Type and screen was performed. Gastroenterology was consulted by the ED physician and requested a medical admission. Patient remained hemodynamically stable in  the ED and has not been in any apparent respiratory distress. She will be admitted to the telemetry unit for ongoing evaluation and management of painless hematochezia suspected secondary to lower GI bleed, likely diverticular.   Review of Systems:  All other systems reviewed and apart from HPI, are negative.  Past Medical History:  Diagnosis Date  . Anxiety   . Arthritis    "bilateral knees, shoulders, elbows; pretty widespread"  . BPPV (benign paroxysmal positional vertigo)   . Cancer ([place])    H/O basal skin cancer 2001  . Depression   . GERD (gastroesophageal reflux disease)   . Glaucoma   . History of blood transfusion ~ 2008   "right after but not related to left knee replacement"  . Hyperlipemia   . Lower GI bleeding ~ 2008   "had to have blood transfusion"  . Macular degeneration   . Osteopenia   . Seasonal allergies   . Sleeping excessive   . Tinnitus of right ear     Past Surgical History:  Procedure Laterality Date  . CATARACT EXTRACTION W/ INTRAOCULAR LENS  IMPLANT, BILATERAL  1990's  . JOINT REPLACEMENT     lft knee  . TONSILLECTOMY AND ADENOIDECTOMY  1946  . TOTAL KNEE ARTHROPLASTY  ~ 2008; 12/20/11   left; right  . TOTAL KNEE ARTHROPLASTY  12/20/2011   Procedure: TOTAL KNEE ARTHROPLASTY;  Surgeon: Augustin Schooling, MD;  Location: [facility];  Service: Orthopedics;  Laterality: Right;  Right Total Knee Arthroplasty  . TUBAL LIGATION  1980's     reports that she quit smoking about 21 years ago. She  has a 20.00 pack-year smoking history. She has never used smokeless tobacco. She reports that she drinks about 3.0 oz of alcohol per week . She reports that she does not use drugs.  Allergies  Allergen Reactions  . Morphine And Related Itching  . Cefadroxil Hives    Patient can take amoxicillin and cipro    Family History  Problem Relation Age of Onset  . Heart attack Mother   . Heart disease Father   . Heart failure Father   . Bladder Cancer Father   .  Supraventricular tachycardia Sister   . Hypertension Sister      Prior to Admission medications   Medication Sig Start Date End Date Taking? Authorizing Provider  albuterol (PROVENTIL HFA;VENTOLIN HFA) 108 (90 BASE) MCG/ACT inhaler Inhale 2 puffs into the lungs daily as needed. For seasonal allergies   Yes Historical Provider, MD  ALPRAZolam (XANAX) 0.25 MG tablet Take 0.25 mg by mouth daily as needed for anxiety.    Yes Historical Provider, MD  amoxicillin (AMOXIL) 500 MG capsule Take 2,000 mg by mouth See admin instructions. Before a dental visit   Yes Historical Provider, MD  Calcium Citrate-Vitamin D (CALCIUM CITRATE + PO) Take 1,000 mg by mouth daily.   Yes Historical Provider, MD  cholecalciferol (VITAMIN D) 1000 UNITS tablet Take 2,000 Units by mouth 2 (two) times daily.    Yes Historical Provider, MD  dorzolamide (TRUSOPT) 2 % ophthalmic solution INSTILL 1 DROP INTO BOTH EYES DAILY 01/16/15  Yes Historical Provider, MD  dorzolamide-timolol (COSOPT) 22.3-6.8 MG/ML ophthalmic solution Place 1 drop into both eyes 2 (two) times daily.   Yes Historical Provider, MD  HYDROcodone-acetaminophen (NORCO) 5-325 MG per tablet Take 1-2 tablets by mouth every 6 (six) hours as needed for moderate pain. For pain   Yes Historical Provider, MD  Multiple Vitamin (MULTIVITAMIN WITH MINERALS) TABS Take 1 tablet by mouth daily.   Yes Historical Provider, MD  Multiple Vitamins-Minerals (ICAPS AREDS 2 PO) Take 1 capsule by mouth 2 (two) times daily.   Yes Historical Provider, MD  omeprazole (PRILOSEC) 20 MG capsule Take 20 mg by mouth daily.    Yes Historical Provider, MD  propranolol (INDERAL) 20 MG tablet Take 20 mg by mouth daily.   Yes Historical Provider, MD  sertraline (ZOLOFT) 100 MG tablet Take 100 mg by mouth daily.   Yes Historical Provider, MD  simvastatin (ZOCOR) 20 MG tablet Take 20 mg by mouth daily.   Yes Historical Provider, MD  vitamin C (ASCORBIC ACID) 500 MG tablet Take 1,000 mg by mouth  daily.    Yes Historical Provider, MD    Physical Exam: Vitals:   10/14/16 1803 10/14/16 1809  BP:  137/65  Pulse:  74  Resp:  17  Temp:  98.1 F (36.7 C)  TempSrc:  Oral  SpO2:  97%  Weight: 88 kg (194 lb)   Height: 5' 2.5" (1.588 m)       Constitutional: No respiratory distress, calm, comfortable, no pallor  Eyes: PERTLA, lids and conjunctivae normal ENMT: Mucous membranes are moist. Posterior pharynx clear of any exudate or lesions.   Neck: normal, supple, no masses, no thyromegaly Respiratory: clear to auscultation bilaterally, no wheezing, no crackles. Normal respiratory effort.  Cardiovascular: S1 & S2 heard, regular rate and rhythm. No extremity edema. No significant JVD. Abdomen: No distension, soft, mild tenderness in lower quadrants without peritoneal signs. Bowel sounds active.  Musculoskeletal: no clubbing / cyanosis. No joint deformity upper and lower extremities.  Skin: no significant rashes, lesions, ulcers. Warm, dry, well-perfused. Neurologic: CN 2-12 grossly intact. Sensation intact, DTR normal. Strength 5/5 in all 4 limbs.  Psychiatric: Alert and oriented x 3. Normal mood and affect.     Labs on Admission: I have personally reviewed following labs and imaging studies  CBC:  Recent Labs Lab 10/14/16 1811  WBC 8.1  HGB 10.8*  HCT 33.7*  MCV 94.4  PLT 188   Basic Metabolic Panel:  Recent Labs Lab 10/14/16 1811  NA 140  K 3.6  CL 109  CO2 24  GLUCOSE 119*  BUN 16  CREATININE 0.51  CALCIUM 8.8*   GFR: Estimated Creatinine Clearance: 61.4 mL/min (by C-G formula based on SCr of 0.51 mg/dL). Liver Function Tests:  Recent Labs Lab 10/14/16 1811  AST 19  ALT 12*  ALKPHOS 57  BILITOT 0.5  PROT 5.9*  ALBUMIN 3.6   No results for input(s): LIPASE, AMYLASE in the last 168 hours. No results for input(s): AMMONIA in the last 168 hours. Coagulation Profile: No results for input(s): INR, PROTIME in the last 168 hours. Cardiac  Enzymes: No results for input(s): CKTOTAL, CKMB, CKMBINDEX, TROPONINI in the last 168 hours. BNP (last 3 results) No results for input(s): PROBNP in the last 8760 hours. HbA1C: No results for input(s): HGBA1C in the last 72 hours. CBG: No results for input(s): GLUCAP in the last 168 hours. Lipid Profile: No results for input(s): CHOL, HDL, LDLCALC, TRIG, CHOLHDL, LDLDIRECT in the last 72 hours. Thyroid Function Tests: No results for input(s): TSH, T4TOTAL, FREET4, T3FREE, THYROIDAB in the last 72 hours. Anemia Panel: No results for input(s): VITAMINB12, FOLATE, FERRITIN, TIBC, IRON, RETICCTPCT in the last 72 hours. Urine analysis:    Component Value Date/Time   COLORURINE YELLOW 10/20/2007 2015   APPEARANCEUR CLEAR 10/20/2007 2015   LABSPEC >1.030 (H) 10/20/2007 2015   PHURINE 5.0 10/20/2007 2015   GLUCOSEU NEGATIVE 10/20/2007 2015   HGBUR NEGATIVE 10/20/2007 2015   [place] NEGATIVE 10/20/2007 2015   KETONESUR 15 (A) 10/20/2007 2015   PROTEINUR NEGATIVE 10/20/2007 2015   UROBILINOGEN 0.2 10/20/2007 2015   NITRITE NEGATIVE 10/20/2007 2015   LEUKOCYTESUR  10/20/2007 2015    NEGATIVE MICROSCOPIC NOT DONE ON URINES WITH NEGATIVE PROTEIN, BLOOD, LEUKOCYTES, NITRITE, OR GLUCOSE <1000 mg/dL.   Sepsis Labs: @LABRCNTIP (procalcitonin:4,lacticidven:4) )No results found for this or any previous visit (from the past 240 hour(s)).   Radiological Exams on Admission: No results found.  EKG: Not performed, will obtain as appropriate.   Assessment/Plan  1. Acute lower GI bleeding  - Pt presents with painless hematochezia, ~7 episodes since the evening of 10/13/16  - She is not tachycardic, hypotensive, or pale on presentation  - FOBT is positive and Hgb is 10.8, consistent with the most recent available CBC's on file  - Gastroenterology is consulting and much appreciated; will follow-up on recommendations  - Plan to keep NPO for now, perform type and screen, and follow serial H&H q6h;  gentle IVF hydration will be provided   2. Anxiety  - Pt reports some anxiety surrounding her current situation - Continue Zoloft, Inderal, prn Xanax   3. Normocytic anemia  - Hgb is 10.8 on admission and stable relative to most recent priors  - Type and screen has been performed and we are following serial H&H as above in setting of lower GIB    DVT prophylaxis: SCD's  Code Status: Full  Family Communication: Discussed with patient Disposition Plan: Admit to telemetry Consults called:  Gastroenterology Admission status: Inpatient    Vianne Bulls, MD [facility] Pager [phone]  If 7PM-7AM, please contact night-coverage [email or web address] Password [facility]  10/14/2016, 11:29 PM

## 2016-10-15 ENCOUNTER — Encounter (HOSPITAL_COMMUNITY): Payer: Self-pay | Admitting: Gastroenterology

## 2016-10-15 ENCOUNTER — Observation Stay (HOSPITAL_COMMUNITY): Payer: Medicare HMO

## 2016-10-15 DIAGNOSIS — D501 Sideropenic dysphagia: Secondary | ICD-10-CM | POA: Diagnosis not present

## 2016-10-15 DIAGNOSIS — K5900 Constipation, unspecified: Secondary | ICD-10-CM | POA: Diagnosis not present

## 2016-10-15 DIAGNOSIS — K922 Gastrointestinal hemorrhage, unspecified: Secondary | ICD-10-CM | POA: Diagnosis not present

## 2016-10-15 DIAGNOSIS — K921 Melena: Secondary | ICD-10-CM | POA: Diagnosis not present

## 2016-10-15 LAB — CBC WITH DIFFERENTIAL/PLATELET
BASOS ABS: 0 10*3/uL (ref 0.0–0.1)
Basophils Relative: 0 %
Eosinophils Absolute: 0.2 10*3/uL (ref 0.0–0.7)
Eosinophils Relative: 3 %
HEMATOCRIT: 28.5 % — AB (ref 36.0–46.0)
Hemoglobin: 9.2 g/dL — ABNORMAL LOW (ref 12.0–15.0)
LYMPHS PCT: 32 %
Lymphs Abs: 1.9 10*3/uL (ref 0.7–4.0)
MCH: 30.8 pg (ref 26.0–34.0)
MCHC: 32.3 g/dL (ref 30.0–36.0)
MCV: 95.3 fL (ref 78.0–100.0)
Monocytes Absolute: 0.7 10*3/uL (ref 0.1–1.0)
Monocytes Relative: 11 %
NEUTROS ABS: 3.2 10*3/uL (ref 1.7–7.7)
Neutrophils Relative %: 54 %
Platelets: 134 10*3/uL — ABNORMAL LOW (ref 150–400)
RBC: 2.99 MIL/uL — ABNORMAL LOW (ref 3.87–5.11)
RDW: 13.9 % (ref 11.5–15.5)
WBC: 6 10*3/uL (ref 4.0–10.5)

## 2016-10-15 LAB — PROTIME-INR
INR: 1.16
Prothrombin Time: 14.9 seconds (ref 11.4–15.2)

## 2016-10-15 LAB — HEMOGLOBIN
Hemoglobin: 8.5 g/dL — ABNORMAL LOW (ref 12.0–15.0)
Hemoglobin: 9.5 g/dL — ABNORMAL LOW (ref 12.0–15.0)

## 2016-10-15 LAB — HEMATOCRIT
HCT: 25.9 % — ABNORMAL LOW (ref 36.0–46.0)
HCT: 29.3 % — ABNORMAL LOW (ref 36.0–46.0)
HCT: 28.2 % — ABNORMAL LOW (ref 36.0–46.0)
HCT: 28.4 % — ABNORMAL LOW (ref 36.0–46.0)

## 2016-10-15 LAB — GLUCOSE, CAPILLARY: GLUCOSE-CAPILLARY: 118 mg/dL — AB (ref 65–99)

## 2016-10-15 NOTE — Care Management Note (Signed)
Case Management Note  Patient Details  Name: [name] MRN: [id number] Date of Birth: June 23, 1941  Subjective/Objective:                 Patient in obs from home with spouse for GI bleed.    Action/Plan:  CM will continue to follow for DC planning.  Expected Discharge Date:                  Expected Discharge Plan:  Home/Self Care  In-House Referral:     Discharge planning Services  CM Consult  Post Acute Care Choice:    Choice offered to:     DME Arranged:    DME Agency:     HH Arranged:    HH Agency:     Status of Service:  In process, will continue to follow  If discussed at Long Length of Stay Meetings, dates discussed:    Additional Comments:  Carles Collet, RN 10/15/2016, 4:50 PM

## 2016-10-15 NOTE — Progress Notes (Signed)
PROGRESS NOTE    [name]  SHF:[id number] DOB: 09-27-1940 DOA: 10/14/2016 PCP: Jerlyn Ly, MD  Outpatient Specialists:     Brief Narrative:  [address] [place] to have hemorrhoidal bleeding in the past   colonoscopy performed by [facility] gastroenterology Dr. Oletta Lamas 4/29/9 showed extensive diverticulosis Osteoarthritis status post right knee arthroplasty 11/2011, left performed 07/2007 hypertension  bipolar Hyperlipidemia Reflux  came to the emergency room with multiple episodes of BRB with some dark.Mali 6-7 episodes not on aspirin FOBT was positive gastroenterology consulted Of note hemoglobin has remained in her usual range of around 10    Assessment & Plan:   Principal Problem:   Lower GI bleeding Active Problems:   Diverticulosis of colon   Anxiety   Lower GI bleed   GI bleed-diverticular probable h/o colonoscopy 2005/2009 with L sided diverticulosis Precipitated by use of ibuprofen 2-3 tablets recently Hemoglobin checks over 6 hours have been in the 8.5-9.5 range-change checks to daily as no large dark stools noted Gastroenterology input appreciated Continue clear liquid diet  Bipolar  continue Zoloft100 daily, Xanax 0.25 when necessary  Hypertension  continue propanolol 20 daily as is hemodynamically stable  ? Asthma- Unclear if has active diagnosis.  Reflux  continue Prilosec 20 daily  Hyperlipidemia  continue Zocor 20 daily   DVT prophylaxis: SCD Code Status: Full Family Communication: patient preers to update them herself.  Son is A  Psychologist, sport and exercise in [place] Disposition Plan: expect   Consultants:   [facility]  Procedures:   None yet  Antimicrobials:    none    Subjective:  Fair  No issues tol diet No cp No sob No n/v No further stool No abd pain  Objective: Vitals:   10/14/16 2345 10/15/16 0000 10/15/16 0047 10/15/16 0510  BP: (!) 119/58 (!) 135/57 (!) 138/57 (!) 110/49  Pulse: 69 93 85 78  Resp: 20 (!) 23 20 16     Temp:   98.3 F (36.8 C) 98.5 F (36.9 C)  TempSrc:      SpO2: 95% 94% 95% 96%  Weight:   83 kg (182 lb 15.7 oz)   Height:        Intake/Output Summary (Last 24 hours) at 10/15/16 0835 Last data filed at 10/15/16 0600  Gross per 24 hour  Intake              189 ml  Output                0 ml  Net              189 ml   Filed Weights   10/14/16 1803 10/15/16 0047  Weight: 88 kg (194 lb) 83 kg (182 lb 15.7 oz)    Examination:  Alert pleasant oriented in nad Neck soft supple cta b, no added sound s1 s2 no m/r/g Neuro is intact   Data Reviewed: I have personally reviewed following labs and imaging studies  CBC:  Recent Labs Lab 10/14/16 1811 10/15/16 0257 10/15/16 0735  WBC 8.1  --   --   HGB 10.8* 8.5* 9.5*  HCT 33.7* 25.9* 29.3*  MCV 94.4  --   --   PLT 153  --   --    Basic Metabolic Panel:  Recent Labs Lab 10/14/16 1811  NA 140  K 3.6  CL 109  CO2 24  GLUCOSE 119*  BUN 16  CREATININE 0.51  CALCIUM 8.8*   GFR: Estimated Creatinine Clearance: 59.5 mL/min (by C-G formula  based on SCr of 0.51 mg/dL). Liver Function Tests:  Recent Labs Lab 10/14/16 1811  AST 19  ALT 12*  ALKPHOS 57  BILITOT 0.5  PROT 5.9*  ALBUMIN 3.6   No results for input(s): LIPASE, AMYLASE in the last 168 hours. No results for input(s): AMMONIA in the last 168 hours. Coagulation Profile:  Recent Labs Lab 10/15/16 0024  INR 1.16   Cardiac Enzymes: No results for input(s): CKTOTAL, CKMB, CKMBINDEX, TROPONINI in the last 168 hours. BNP (last 3 results) No results for input(s): PROBNP in the last 8760 hours. HbA1C: No results for input(s): HGBA1C in the last 72 hours. CBG:  Recent Labs Lab 10/15/16 0033  GLUCAP 118*   Lipid Profile: No results for input(s): CHOL, HDL, LDLCALC, TRIG, CHOLHDL, LDLDIRECT in the last 72 hours. Thyroid Function Tests: No results for input(s): TSH, T4TOTAL, FREET4, T3FREE, THYROIDAB in the last 72 hours. Anemia Panel: No  results for input(s): VITAMINB12, FOLATE, FERRITIN, TIBC, IRON, RETICCTPCT in the last 72 hours. Urine analysis:    Component Value Date/Time   COLORURINE YELLOW 10/20/2007 2015   APPEARANCEUR CLEAR 10/20/2007 2015   LABSPEC >1.030 (H) 10/20/2007 2015   PHURINE 5.0 10/20/2007 2015   GLUCOSEU NEGATIVE 10/20/2007 2015   HGBUR NEGATIVE 10/20/2007 2015   [place] NEGATIVE 10/20/2007 2015   KETONESUR 15 (A) 10/20/2007 2015   PROTEINUR NEGATIVE 10/20/2007 2015   UROBILINOGEN 0.2 10/20/2007 2015   NITRITE NEGATIVE 10/20/2007 2015   LEUKOCYTESUR  10/20/2007 2015    NEGATIVE MICROSCOPIC NOT DONE ON URINES WITH NEGATIVE PROTEIN, BLOOD, LEUKOCYTES, NITRITE, OR GLUCOSE <1000 mg/dL.   Sepsis Labs: @LABRCNTIP (procalcitonin:4,lacticidven:4)  )No results found for this or any previous visit (from the past 240 hour(s)).       Radiology Studies: No results found.      Scheduled Meds: . calcium carbonate  2 tablet Oral Q breakfast  . dorzolamide-timolol  1 drop Both Eyes BID  . multivitamin with minerals  1 tablet Oral Daily  . pantoprazole (PROTONIX) IV  40 mg Intravenous Daily  . propranolol  20 mg Oral Daily  . sertraline  100 mg Oral Daily  . simvastatin  20 mg Oral Daily  . sodium chloride flush  3 mL Intravenous Q12H   Continuous Infusions: . sodium chloride 90 mL/hr at 10/15/16 0054     LOS: 1 day    Time spent: Carter, MD [facility] ([facility]   If 7PM-7AM, please contact night-coverage [email or web address] Password [facility] 10/15/2016, 8:35 AM

## 2016-10-15 NOTE — Care Management Obs Status (Signed)
[place] NOTIFICATION   Patient Details  Name: [name] MRN: [id number] Date of Birth: 26-Feb-1941   Medicare Observation Status Notification Given:  Yes    Carles Collet, RN 10/15/2016, 3:00 PM

## 2016-10-15 NOTE — Care Management CC44 (Signed)
Condition Code 44 Documentation Completed  Patient Details  Name: RIANNON MUKHERJEE MRN: [id number] Date of Birth: 03-09-1941   Condition Code 84 given:  Yes Patient signature on Condition Code 44 notice:  Yes Documentation of 2 MD's agreement:  Yes Code 44 added to claim:  Yes    Carles Collet, RN 10/15/2016, 3:00 PM

## 2016-10-15 NOTE — Consult Note (Signed)
Reason for Consult: Lower GI bleeding Referring Physician: Hospital team  [name] is an 76 y.o. female.  HPI: Patient known to my partner Dr. Oletta Lamas with a few previous diverticular bleeds but none in at least 5 years who has no bowel complaints as long as she takes her stool softeners and MiraLAX and her hospital computer chart was reviewed and our office computer chart was reviewed and best I can tell her last 2 colonoscopies were in 2005 and 2009 both of which showed left-sided diverticuli only and she'll occasionally have some right-sided discomfort but not in a while and she minimizes over-the-counter medicines but did use some ibuprofen 3 days ago and all her blood she saw was bright red and she has not had any since he's been here and has no other new complaints  Past Medical History:  Diagnosis Date  . Anxiety   . Arthritis    "bilateral knees, shoulders, elbows; pretty widespread"  . BPPV (benign paroxysmal positional vertigo)   . Cancer ([place])    H/O basal skin cancer 2001  . Depression   . GERD (gastroesophageal reflux disease)   . Glaucoma   . History of blood transfusion ~ 2008   "right after but not related to left knee replacement"  . Hyperlipemia   . Lower GI bleeding ~ 2008   "had to have blood transfusion"  . Macular degeneration   . Osteopenia   . Seasonal allergies   . Sleeping excessive   . Tinnitus of right ear     Past Surgical History:  Procedure Laterality Date  . CATARACT EXTRACTION W/ INTRAOCULAR LENS  IMPLANT, BILATERAL  1990's  . JOINT REPLACEMENT     lft knee  . TONSILLECTOMY AND ADENOIDECTOMY  1946  . TOTAL KNEE ARTHROPLASTY  ~ 2008; 12/20/11   left; right  . TOTAL KNEE ARTHROPLASTY  12/20/2011   Procedure: TOTAL KNEE ARTHROPLASTY;  Surgeon: Augustin Schooling, MD;  Location: [facility];  Service: Orthopedics;  Laterality: Right;  Right Total Knee Arthroplasty  . TUBAL LIGATION  1980's    Family History  Problem Relation Age of Onset  . Heart  attack Mother   . Heart disease Father   . Heart failure Father   . Bladder Cancer Father   . Supraventricular tachycardia Sister   . Hypertension Sister     Social History:  reports that she quit smoking about 21 years ago. She has a 20.00 pack-year smoking history. She has never used smokeless tobacco. She reports that she drinks about 3.0 oz of alcohol per week . She reports that she does not use drugs.  Allergies:  Allergies  Allergen Reactions  . Morphine And Related Itching  . Cefadroxil Hives    Patient can take amoxicillin and cipro    Medications: I have reviewed the patient's current medications.  Results for orders placed or performed during the hospital encounter of 10/14/16 (from the past 48 hour(s))  Comprehensive metabolic panel     Status: Abnormal   Collection Time: 10/14/16  6:11 PM  Result Value Ref Range   Sodium 140 135 - 145 mmol/L   Potassium 3.6 3.5 - 5.1 mmol/L   Chloride 109 101 - 111 mmol/L   CO2 24 22 - 32 mmol/L   Glucose, Bld 119 (H) 65 - 99 mg/dL   BUN 16 6 - 20 mg/dL   Creatinine, Ser 0.51 0.44 - 1.00 mg/dL   Calcium 8.8 (L) 8.9 - 10.3 mg/dL   Total Protein  5.9 (L) 6.5 - 8.1 g/dL   Albumin 3.6 3.5 - 5.0 g/dL   AST 19 15 - 41 U/L   ALT 12 (L) 14 - 54 U/L   Alkaline Phosphatase 57 38 - 126 U/L   Total Bilirubin 0.5 0.3 - 1.2 mg/dL   GFR calc non Af Amer >60 >60 mL/min   GFR calc Af Amer >60 >60 mL/min    Comment: (NOTE) The eGFR has been calculated using the CKD EPI equation. This calculation has not been validated in all clinical situations. eGFR's persistently <60 mL/min signify possible Chronic Kidney Disease.    Anion gap 7 5 - 15  CBC     Status: Abnormal   Collection Time: 10/14/16  6:11 PM  Result Value Ref Range   WBC 8.1 4.0 - 10.5 K/uL   RBC 3.57 (L) 3.87 - 5.11 MIL/uL   Hemoglobin 10.8 (L) 12.0 - 15.0 g/dL   HCT 33.7 (L) 36.0 - 46.0 %   MCV 94.4 78.0 - 100.0 fL   MCH 30.3 26.0 - 34.0 pg   MCHC 32.0 30.0 - 36.0 g/dL    RDW 13.7 11.5 - 15.5 %   Platelets 153 150 - 400 K/uL  Type and screen [place]     Status: None   Collection Time: 10/14/16  6:15 PM  Result Value Ref Range   ABO/RH(D) O POS    Antibody Screen NEG    Sample Expiration 10/17/2016   POC occult blood, ED     Status: Abnormal   Collection Time: 10/14/16  9:54 PM  Result Value Ref Range   Fecal Occult Bld POSITIVE (A) NEGATIVE  Protime-INR     Status: None   Collection Time: 10/15/16 12:24 AM  Result Value Ref Range   Prothrombin Time 14.9 11.4 - 15.2 seconds   INR 1.16   Glucose, capillary     Status: Abnormal   Collection Time: 10/15/16 12:33 AM  Result Value Ref Range   Glucose-Capillary 118 (H) 65 - 99 mg/dL  Hemoglobin     Status: Abnormal   Collection Time: 10/15/16  2:57 AM  Result Value Ref Range   Hemoglobin 8.5 (L) 12.0 - 15.0 g/dL    Comment: DELTA CHECK NOTED REPEATED TO VERIFY   Hematocrit     Status: Abnormal   Collection Time: 10/15/16  2:57 AM  Result Value Ref Range   HCT 25.9 (L) 36.0 - 46.0 %  Hemoglobin     Status: Abnormal   Collection Time: 10/15/16  7:35 AM  Result Value Ref Range   Hemoglobin 9.5 (L) 12.0 - 15.0 g/dL  Hematocrit     Status: Abnormal   Collection Time: 10/15/16  7:35 AM  Result Value Ref Range   HCT 29.3 (L) 36.0 - 46.0 %    No results found.  ROS negative except above Blood pressure (!) 123/57, pulse 80, temperature 98.9 F (37.2 C), temperature source Oral, resp. rate 16, height 5' 2.5" (1.588 m), weight 83 kg (182 lb 15.7 oz), SpO2 96 %. Physical Exam elderly pleasant lying comfortably in the bed vital signs stable afebrile exam pertinent for her abdomen being soft nontender good bowel sounds labs reviewed BUN and creatinine and liver tests okay INR okay hemoglobin a little low  Assessment/Plan: Almost certainly recurrent diverticular bleeding Plan: We will allow clear liquids for now case was discussed with the hospital team and patient if she has no  further bleeding can follow-up with my partner Dr. Oletta Lamas  in a week or 2 in the office and we discussed her occasional ibuprofen and our team will follow with you but consider nuclear bleeding scan if bleeding seems to increase  Jenavi Beedle E 10/15/2016, 11:02 AM

## 2016-10-16 DIAGNOSIS — K5731 Diverticulosis of large intestine without perforation or abscess with bleeding: Secondary | ICD-10-CM | POA: Diagnosis not present

## 2016-10-16 DIAGNOSIS — F419 Anxiety disorder, unspecified: Secondary | ICD-10-CM | POA: Diagnosis not present

## 2016-10-16 DIAGNOSIS — K5730 Diverticulosis of large intestine without perforation or abscess without bleeding: Secondary | ICD-10-CM | POA: Diagnosis not present

## 2016-10-16 DIAGNOSIS — K922 Gastrointestinal hemorrhage, unspecified: Secondary | ICD-10-CM

## 2016-10-16 DIAGNOSIS — D500 Iron deficiency anemia secondary to blood loss (chronic): Secondary | ICD-10-CM | POA: Diagnosis not present

## 2016-10-16 DIAGNOSIS — K921 Melena: Secondary | ICD-10-CM | POA: Diagnosis not present

## 2016-10-16 DIAGNOSIS — R69 Illness, unspecified: Secondary | ICD-10-CM | POA: Diagnosis not present

## 2016-10-16 LAB — CBC
HCT: 29.4 % — ABNORMAL LOW (ref 36.0–46.0)
Hemoglobin: 9.3 g/dL — ABNORMAL LOW (ref 12.0–15.0)
MCH: 30.2 pg (ref 26.0–34.0)
MCHC: 31.6 g/dL (ref 30.0–36.0)
MCV: 95.5 fL (ref 78.0–100.0)
PLATELETS: 139 10*3/uL — AB (ref 150–400)
RBC: 3.08 MIL/uL — ABNORMAL LOW (ref 3.87–5.11)
RDW: 14.2 % (ref 11.5–15.5)
WBC: 6.9 10*3/uL (ref 4.0–10.5)

## 2016-10-16 MED ORDER — POLYETHYLENE GLYCOL 3350 17 G PO PACK
17.0000 g | PACK | Freq: Two times a day (BID) | ORAL | Status: DC
  Administered 2016-10-16: 17 g via ORAL

## 2016-10-16 MED ORDER — DIPHENHYDRAMINE HCL 25 MG PO CAPS: 50.0000 mg | ORAL_CAPSULE | Freq: Once | ORAL | Status: DC

## 2016-10-16 NOTE — Progress Notes (Signed)
Subjective: No further bleeding since yesterday morning. Constipated; a little crampy abdominal discomfort.  Objective: Vital signs in last 24 hours: Temp:  [98.2 F (36.8 C)-98.9 F (37.2 C)] 98.6 F (37 C) (04/18 0341) Pulse Rate:  [72-89] 89 (04/18 0341) Resp:  [16-18] 16 (04/18 0341) BP: (117-131)/(53-58) 125/58 (04/18 0341) SpO2:  [93 %-96 %] 93 % (04/18 0341) Weight:  [84.8 kg (187 lb)] 84.8 kg (187 lb) (04/18 0341) Weight change: -3.175 kg (-7 lb) Last BM Date: 10/15/16  PE: GEN:  NAD ABD:  Protuberant, soft, non-tender  Lab Results: CBC    Component Value Date/Time   WBC 6.0 10/15/2016 1417   RBC 2.99 (L) 10/15/2016 1417   HGB 9.2 (L) 10/15/2016 1417   HCT 28.4 (L) 10/15/2016 2020   PLT 134 (L) 10/15/2016 1417   MCV 95.3 10/15/2016 1417   MCH 30.8 10/15/2016 1417   MCHC 32.3 10/15/2016 1417   RDW 13.9 10/15/2016 1417   LYMPHSABS 1.9 10/15/2016 1417   MONOABS 0.7 10/15/2016 1417   EOSABS 0.2 10/15/2016 1417   BASOSABS 0.0 10/15/2016 1417   CMP     Component Value Date/Time   NA 140 10/14/2016 1811   K 3.6 10/14/2016 1811   CL 109 10/14/2016 1811   CO2 24 10/14/2016 1811   GLUCOSE 119 (H) 10/14/2016 1811   BUN 16 10/14/2016 1811   CREATININE 0.51 10/14/2016 1811   CALCIUM 8.8 (L) 10/14/2016 1811   PROT 5.9 (L) 10/14/2016 1811   ALBUMIN 3.6 10/14/2016 1811   AST 19 10/14/2016 1811   ALT 12 (L) 10/14/2016 1811   ALKPHOS 57 10/14/2016 1811   BILITOT 0.5 10/14/2016 1811   GFRNONAA >60 10/14/2016 1811   GFRAA >60 10/14/2016 1811   Assessment:  1.  Acute blood loss anemia. 2.  Hematochezia, resolved, suspect diverticular. 3.  Chronic constipation.  Plan:  1.  Advance diet. 2.  Miralax 17 grams po bid. 3.  Follow CBCs. 4.  Could go home later today or tomorrow from GI perspective, if tolerates diet and no further rebleeding. 5.  Will sign-off; please call with questions; thank you for the consultation; will arrange outpatient GI  follow-up with  Dr. Oletta Lamas.   Landry Dyke 10/16/2016, 8:39 AM   Pager [phone] If no answer or after 5 PM call [phone]

## 2016-10-16 NOTE — Progress Notes (Signed)
Patient Discharge: Disposition: Patient discharged home with husband. Education: Reviewed medications, follow-up appointments and discharge instructions, verbalized understanding. IV: Discontinued IV before discharge. Telemetry: Discontinued Tele before discharge. Transportation: Patient escorted out of the unit in w/c accompanied by the husband. Belongings: Patient took all her belongings with her.

## 2016-10-16 NOTE — Discharge Summary (Signed)
Physician Discharge Summary  [name] AYT:[id number] DOB: Jul 21, 1940 DOA: 10/14/2016  PCP: Jerlyn Ly, MD  Admit date: 10/14/2016 Discharge date: 10/16/2016  Time spent: 25 minutes  Recommendations for Outpatient Follow-up:  1. Follow up PCP in 2 week, check CBC in 2 weeks 2. Follow up Gastroenterology in 2 weeks   Discharge Diagnoses:  Principal Problem:   Lower GI bleeding Active Problems:   Diverticulosis of colon   Anxiety   Lower GI bleed   Discharge Condition: Stable  Diet recommendation: Heart healthy diet  Filed Weights   10/14/16 1803 10/15/16 0047 10/16/16 0341  Weight: 88 kg (194 lb) 83 kg (182 lb 15.7 oz) 84.8 kg (187 lb)    History of present illness:  76 y.o. female with medical history significant for anxiety, osteoarthritis status post total knee replacement, diverticulosis, and history of lower GI bleed requiring transfusion who now presents to the emergency department with rectal bleeding. Patient reports that she had been in her usual state of health until yesterday evening when she developed an acute sensation that she needed to have diarrhea, went to the toilet, and passed bright red blood mixed with some dark red blood. There was no pain associated with this and she has not experiencing any nausea or indigestion. She went on to have another 6-7 episodes overnight and today, also with bright red and dark blood.  Hospital Course:   GI bleed- likely diverticular bleed patient's colonoscopy in 2005 and 2009 showed left-sided vertical necrosis. Hemoglobin has remained stable today was 9.3. Patient had 2 episodes of dark stools today. I called and discussed with GI Dr. Paulita Fujita, who says that probably from the old blood in the colon. She will be discharged today and follow up with GI as outpatient.  Anemia- hemoglobin has remained stable , today hemoglobin is 9.3. She'll follow-up with PCP in 2 weeks to recheck CBC.  Bipolar disorder-stable, continue Zoloft  100 mg daily, Xanax 0.5 mg when necessary.  Hypertension- continue propranolol 20 g by mouth daily  Hyperlipidemia-continue Zocor  Procedures:  None  Consultations:  Gastroenterology  Discharge Exam: Vitals:   10/16/16 0905 10/16/16 1400  BP: (!) 117/46 (!) 101/49  Pulse: 85 86  Resp: 20 20  Temp: 98.3 F (36.8 C) 97.7 F (36.5 C)    General: Appears in no acute distress Cardiovascular: RRR, S1-S2 Respiratory: Clear to auscultation bilaterally Abdomen- soft, nontender, no organomegaly  Discharge Instructions   Discharge Instructions    Diet - low sodium heart healthy    Complete by:  As directed    Discharge instructions    Complete by:  As directed    If you start having bloody bowel movements at home, come back to ED for further evalauation   Increase activity slowly    Complete by:  As directed      Current Discharge Medication List    CONTINUE these medications which have NOT CHANGED   Details  albuterol (PROVENTIL HFA;VENTOLIN HFA) 108 (90 BASE) MCG/ACT inhaler Inhale 2 puffs into the lungs daily as needed. For seasonal allergies    ALPRAZolam (XANAX) 0.25 MG tablet Take 0.25 mg by mouth daily as needed for anxiety.     amoxicillin (AMOXIL) 500 MG capsule Take 2,000 mg by mouth See admin instructions. Before a dental visit    Calcium Citrate-Vitamin D (CALCIUM CITRATE + PO) Take 1,000 mg by mouth daily.    cholecalciferol (VITAMIN D) 1000 UNITS tablet Take 2,000 Units by mouth 2 (two) times daily.  dorzolamide (TRUSOPT) 2 % ophthalmic solution INSTILL 1 DROP INTO BOTH EYES DAILY Refills: 3    dorzolamide-timolol (COSOPT) 22.3-6.8 MG/ML ophthalmic solution Place 1 drop into both eyes 2 (two) times daily.    HYDROcodone-acetaminophen (NORCO) 5-325 MG per tablet Take 1-2 tablets by mouth every 6 (six) hours as needed for moderate pain. For pain    Multiple Vitamin (MULTIVITAMIN WITH MINERALS) TABS Take 1 tablet by mouth daily.    Multiple  Vitamins-Minerals (ICAPS AREDS 2 PO) Take 1 capsule by mouth 2 (two) times daily.    omeprazole (PRILOSEC) 20 MG capsule Take 20 mg by mouth daily.     propranolol (INDERAL) 20 MG tablet Take 20 mg by mouth daily.    sertraline (ZOLOFT) 100 MG tablet Take 100 mg by mouth daily.    simvastatin (ZOCOR) 20 MG tablet Take 20 mg by mouth daily.    vitamin C (ASCORBIC ACID) 500 MG tablet Take 1,000 mg by mouth daily.        Allergies  Allergen Reactions  . Morphine And Related Itching  . Cefadroxil Hives    Patient can take amoxicillin and cipro      The results of significant diagnostics from this hospitalization (including imaging, microbiology, ancillary and laboratory) are listed below for reference.    Significant Diagnostic Studies: Dg Abd 2 Views  Result Date: 10/15/2016 CLINICAL DATA:  Lower abdominal pain earlier today with constipation x a couple of days EXAM: ABDOMEN - 2 VIEW COMPARISON:  Chest 12/11/2011 FINDINGS: Scattered gas and stool in the colon. No small or large bowel distention. No free intra-abdominal air. No abnormal air-fluid levels. No radiopaque stones. Visualized bones appear intact. Degenerative changes in the spine. IMPRESSION: Nonobstructive bowel gas pattern. Electronically Signed   By: Lucienne Capers M.D.   On: 10/15/2016 22:40    Microbiology: No results found for this or any previous visit (from the past 240 hour(s)).   Labs: Basic Metabolic Panel:  Recent Labs Lab 10/14/16 1811  NA 140  K 3.6  CL 109  CO2 24  GLUCOSE 119*  BUN 16  CREATININE 0.51  CALCIUM 8.8*   Liver Function Tests:  Recent Labs Lab 10/14/16 1811  AST 19  ALT 12*  ALKPHOS 57  BILITOT 0.5  PROT 5.9*  ALBUMIN 3.6   No results for input(s): LIPASE, AMYLASE in the last 168 hours. No results for input(s): AMMONIA in the last 168 hours. CBC:  Recent Labs Lab 10/14/16 1811 10/15/16 0257 10/15/16 0735 10/15/16 1417 10/15/16 2020 10/16/16 1400  WBC 8.1  --    --  6.0  --  6.9  NEUTROABS  --   --   --  3.2  --   --   HGB 10.8* 8.5* 9.5* 9.2*  --  9.3*  HCT 33.7* 25.9* 29.3* 28.5*  28.2* 28.4* 29.4*  MCV 94.4  --   --  95.3  --  95.5  PLT 153  --   --  134*  --  139*    CBG:  Recent Labs Lab 10/15/16 0033  GLUCAP 118*    Signed:  Eleonore Chiquito S MD.  [facility] 10/16/2016, 3:33 PM

## 2016-10-30 DIAGNOSIS — J45998 Other asthma: Secondary | ICD-10-CM | POA: Diagnosis not present

## 2016-10-30 DIAGNOSIS — R0609 Other forms of dyspnea: Secondary | ICD-10-CM | POA: Diagnosis not present

## 2016-10-30 DIAGNOSIS — D62 Acute posthemorrhagic anemia: Secondary | ICD-10-CM | POA: Diagnosis not present

## 2016-10-30 DIAGNOSIS — K922 Gastrointestinal hemorrhage, unspecified: Secondary | ICD-10-CM | POA: Diagnosis not present

## 2016-10-30 DIAGNOSIS — J3089 Other allergic rhinitis: Secondary | ICD-10-CM | POA: Diagnosis not present

## 2016-10-30 DIAGNOSIS — Z6834 Body mass index (BMI) 34.0-34.9, adult: Secondary | ICD-10-CM | POA: Diagnosis not present

## 2016-11-06 DIAGNOSIS — K922 Gastrointestinal hemorrhage, unspecified: Secondary | ICD-10-CM | POA: Diagnosis not present

## 2016-11-20 DIAGNOSIS — R1310 Dysphagia, unspecified: Secondary | ICD-10-CM | POA: Diagnosis not present

## 2016-11-20 DIAGNOSIS — K922 Gastrointestinal hemorrhage, unspecified: Secondary | ICD-10-CM | POA: Diagnosis not present

## 2016-11-27 ENCOUNTER — Other Ambulatory Visit: Payer: Self-pay | Admitting: Gastroenterology

## 2016-11-27 DIAGNOSIS — R1310 Dysphagia, unspecified: Secondary | ICD-10-CM

## 2016-12-12 ENCOUNTER — Ambulatory Visit
Admission: RE | Admit: 2016-12-12 | Discharge: 2016-12-12 | Disposition: A | Discharge status: Home / Self Care | Payer: Medicare HMO | Source: Ambulatory Visit | Attending: Gastroenterology | Admitting: Gastroenterology

## 2016-12-12 DIAGNOSIS — K922 Gastrointestinal hemorrhage, unspecified: Secondary | ICD-10-CM | POA: Diagnosis not present

## 2016-12-12 DIAGNOSIS — K219 Gastro-esophageal reflux disease without esophagitis: Secondary | ICD-10-CM | POA: Diagnosis not present

## 2016-12-12 DIAGNOSIS — R1310 Dysphagia, unspecified: Secondary | ICD-10-CM

## 2016-12-23 DIAGNOSIS — R7301 Impaired fasting glucose: Secondary | ICD-10-CM | POA: Diagnosis not present

## 2016-12-23 DIAGNOSIS — E784 Other hyperlipidemia: Secondary | ICD-10-CM | POA: Diagnosis not present

## 2016-12-23 DIAGNOSIS — E559 Vitamin D deficiency, unspecified: Secondary | ICD-10-CM | POA: Diagnosis not present

## 2016-12-30 DIAGNOSIS — Z1231 Encounter for screening mammogram for malignant neoplasm of breast: Secondary | ICD-10-CM | POA: Diagnosis not present

## 2016-12-30 DIAGNOSIS — J3089 Other allergic rhinitis: Secondary | ICD-10-CM | POA: Diagnosis not present

## 2016-12-30 DIAGNOSIS — R0609 Other forms of dyspnea: Secondary | ICD-10-CM | POA: Diagnosis not present

## 2016-12-30 DIAGNOSIS — R69 Illness, unspecified: Secondary | ICD-10-CM | POA: Diagnosis not present

## 2016-12-30 DIAGNOSIS — M791 Myalgia: Secondary | ICD-10-CM | POA: Diagnosis not present

## 2016-12-30 DIAGNOSIS — J45998 Other asthma: Secondary | ICD-10-CM | POA: Diagnosis not present

## 2016-12-30 DIAGNOSIS — R2689 Other abnormalities of gait and mobility: Secondary | ICD-10-CM | POA: Diagnosis not present

## 2016-12-30 DIAGNOSIS — R002 Palpitations: Secondary | ICD-10-CM | POA: Diagnosis not present

## 2016-12-30 DIAGNOSIS — N390 Urinary tract infection, site not specified: Secondary | ICD-10-CM | POA: Diagnosis not present

## 2016-12-30 DIAGNOSIS — Z0000 Encounter for general adult medical examination without abnormal findings: Secondary | ICD-10-CM | POA: Diagnosis not present

## 2017-01-15 DIAGNOSIS — R1310 Dysphagia, unspecified: Secondary | ICD-10-CM | POA: Diagnosis not present

## 2017-01-20 DIAGNOSIS — R1310 Dysphagia, unspecified: Secondary | ICD-10-CM | POA: Diagnosis not present

## 2017-02-26 DIAGNOSIS — R1310 Dysphagia, unspecified: Secondary | ICD-10-CM | POA: Diagnosis not present

## 2017-04-12 DIAGNOSIS — Z23 Encounter for immunization: Secondary | ICD-10-CM | POA: Diagnosis not present

## 2017-04-16 DIAGNOSIS — H353132 Nonexudative age-related macular degeneration, bilateral, intermediate dry stage: Secondary | ICD-10-CM | POA: Diagnosis not present

## 2017-04-16 DIAGNOSIS — H04123 Dry eye syndrome of bilateral lacrimal glands: Secondary | ICD-10-CM | POA: Diagnosis not present

## 2017-04-16 DIAGNOSIS — H401132 Primary open-angle glaucoma, bilateral, moderate stage: Secondary | ICD-10-CM | POA: Diagnosis not present

## 2017-05-16 DIAGNOSIS — Z961 Presence of intraocular lens: Secondary | ICD-10-CM | POA: Diagnosis not present

## 2017-05-16 DIAGNOSIS — H401132 Primary open-angle glaucoma, bilateral, moderate stage: Secondary | ICD-10-CM | POA: Diagnosis not present

## 2017-07-21 DIAGNOSIS — H401122 Primary open-angle glaucoma, left eye, moderate stage: Secondary | ICD-10-CM | POA: Diagnosis not present

## 2017-07-21 DIAGNOSIS — H401111 Primary open-angle glaucoma, right eye, mild stage: Secondary | ICD-10-CM | POA: Diagnosis not present

## 2017-08-04 DIAGNOSIS — H401122 Primary open-angle glaucoma, left eye, moderate stage: Secondary | ICD-10-CM | POA: Diagnosis not present

## 2017-08-04 DIAGNOSIS — H401111 Primary open-angle glaucoma, right eye, mild stage: Secondary | ICD-10-CM | POA: Diagnosis not present

## 2017-08-07 ENCOUNTER — Inpatient Hospital Stay (HOSPITAL_COMMUNITY)
Admission: EM | Admit: 2017-08-07 | Discharge: 2017-08-10 | DRG: 193 | Disposition: A | Discharge status: Home Health | Payer: Medicare HMO | Attending: Internal Medicine | Admitting: Internal Medicine

## 2017-08-07 ENCOUNTER — Encounter (HOSPITAL_COMMUNITY): Payer: Self-pay

## 2017-08-07 ENCOUNTER — Emergency Department (HOSPITAL_COMMUNITY): Payer: Medicare HMO

## 2017-08-07 ENCOUNTER — Other Ambulatory Visit: Payer: Self-pay

## 2017-08-07 DIAGNOSIS — I10 Essential (primary) hypertension: Secondary | ICD-10-CM | POA: Diagnosis present

## 2017-08-07 DIAGNOSIS — H409 Unspecified glaucoma: Secondary | ICD-10-CM | POA: Diagnosis present

## 2017-08-07 DIAGNOSIS — F329 Major depressive disorder, single episode, unspecified: Secondary | ICD-10-CM | POA: Diagnosis present

## 2017-08-07 DIAGNOSIS — E876 Hypokalemia: Secondary | ICD-10-CM | POA: Diagnosis present

## 2017-08-07 DIAGNOSIS — Z96653 Presence of artificial knee joint, bilateral: Secondary | ICD-10-CM | POA: Diagnosis present

## 2017-08-07 DIAGNOSIS — F419 Anxiety disorder, unspecified: Secondary | ICD-10-CM | POA: Diagnosis present

## 2017-08-07 DIAGNOSIS — R05 Cough: Secondary | ICD-10-CM | POA: Diagnosis not present

## 2017-08-07 DIAGNOSIS — Z8249 Family history of ischemic heart disease and other diseases of the circulatory system: Secondary | ICD-10-CM

## 2017-08-07 DIAGNOSIS — F32A Depression, unspecified: Secondary | ICD-10-CM | POA: Diagnosis present

## 2017-08-07 DIAGNOSIS — Z85828 Personal history of other malignant neoplasm of skin: Secondary | ICD-10-CM

## 2017-08-07 DIAGNOSIS — J209 Acute bronchitis, unspecified: Secondary | ICD-10-CM | POA: Diagnosis present

## 2017-08-07 DIAGNOSIS — R030 Elevated blood-pressure reading, without diagnosis of hypertension: Secondary | ICD-10-CM | POA: Diagnosis not present

## 2017-08-07 DIAGNOSIS — R69 Illness, unspecified: Secondary | ICD-10-CM | POA: Diagnosis not present

## 2017-08-07 DIAGNOSIS — Z87891 Personal history of nicotine dependence: Secondary | ICD-10-CM

## 2017-08-07 DIAGNOSIS — R0602 Shortness of breath: Secondary | ICD-10-CM | POA: Diagnosis not present

## 2017-08-07 DIAGNOSIS — R296 Repeated falls: Secondary | ICD-10-CM | POA: Diagnosis present

## 2017-08-07 DIAGNOSIS — J9601 Acute respiratory failure with hypoxia: Secondary | ICD-10-CM | POA: Diagnosis present

## 2017-08-07 DIAGNOSIS — J101 Influenza due to other identified influenza virus with other respiratory manifestations: Principal | ICD-10-CM | POA: Diagnosis present

## 2017-08-07 DIAGNOSIS — E860 Dehydration: Secondary | ICD-10-CM | POA: Diagnosis present

## 2017-08-07 DIAGNOSIS — R531 Weakness: Secondary | ICD-10-CM

## 2017-08-07 DIAGNOSIS — E785 Hyperlipidemia, unspecified: Secondary | ICD-10-CM | POA: Diagnosis present

## 2017-08-07 DIAGNOSIS — J111 Influenza due to unidentified influenza virus with other respiratory manifestations: Secondary | ICD-10-CM | POA: Diagnosis not present

## 2017-08-07 DIAGNOSIS — K219 Gastro-esophageal reflux disease without esophagitis: Secondary | ICD-10-CM | POA: Diagnosis present

## 2017-08-07 DIAGNOSIS — H3530 Unspecified macular degeneration: Secondary | ICD-10-CM | POA: Diagnosis not present

## 2017-08-07 DIAGNOSIS — F418 Other specified anxiety disorders: Secondary | ICD-10-CM | POA: Diagnosis present

## 2017-08-07 LAB — COMPREHENSIVE METABOLIC PANEL
ALBUMIN: 3.6 g/dL (ref 3.5–5.0)
ALT: 20 U/L (ref 14–54)
AST: 59 U/L — AB (ref 15–41)
Alkaline Phosphatase: 56 U/L (ref 38–126)
Anion gap: 10 (ref 5–15)
BILIRUBIN TOTAL: 0.7 mg/dL (ref 0.3–1.2)
BUN: 11 mg/dL (ref 6–20)
CHLORIDE: 100 mmol/L — AB (ref 101–111)
CO2: 26 mmol/L (ref 22–32)
Calcium: 8.7 mg/dL — ABNORMAL LOW (ref 8.9–10.3)
Creatinine, Ser: 0.59 mg/dL (ref 0.44–1.00)
GFR calc Af Amer: >60 mL/min (ref >60)
GFR calc non Af Amer: >60 mL/min (ref >60)
Glucose, Bld: 108 mg/dL — ABNORMAL HIGH (ref 65–99)
POTASSIUM: 3.2 mmol/L — AB (ref 3.5–5.1)
SODIUM: 136 mmol/L (ref 135–145)
TOTAL PROTEIN: 6.5 g/dL (ref 6.5–8.1)

## 2017-08-07 LAB — URINALYSIS, ROUTINE W REFLEX MICROSCOPIC
Bilirubin Urine: NEGATIVE
GLUCOSE, UA: NEGATIVE mg/dL
HGB URINE DIPSTICK: NEGATIVE
KETONES UR: 20 mg/dL — AB
Leukocytes, UA: NEGATIVE
Nitrite: NEGATIVE
PH: 5 (ref 5.0–8.0)
PROTEIN: NEGATIVE mg/dL
Specific Gravity, Urine: 1.01 (ref 1.005–1.030)

## 2017-08-07 LAB — I-STAT CG4 LACTIC ACID, ED: Lactic Acid, Venous: 1.31 mmol/L (ref 0.5–1.9)

## 2017-08-07 LAB — CBC WITH DIFFERENTIAL/PLATELET
Basophils Absolute: 0 10*3/uL (ref 0.0–0.1)
Basophils Relative: 0 %
EOS PCT: 0 %
Eosinophils Absolute: 0 10*3/uL (ref 0.0–0.7)
HEMATOCRIT: 40.3 % (ref 36.0–46.0)
Hemoglobin: 13 g/dL (ref 12.0–15.0)
LYMPHS ABS: 0.9 10*3/uL (ref 0.7–4.0)
LYMPHS PCT: 15 %
MCH: 28.6 pg (ref 26.0–34.0)
MCHC: 32.3 g/dL (ref 30.0–36.0)
MCV: 88.8 fL (ref 78.0–100.0)
MONO ABS: 1 10*3/uL (ref 0.1–1.0)
MONOS PCT: 17 %
Neutro Abs: 4 10*3/uL (ref 1.7–7.7)
Neutrophils Relative %: 68 %
PLATELETS: 128 10*3/uL — AB (ref 150–400)
RBC: 4.54 MIL/uL (ref 3.87–5.11)
RDW: 15.5 % (ref 11.5–15.5)
WBC: 5.9 10*3/uL (ref 4.0–10.5)

## 2017-08-07 LAB — INFLUENZA PANEL BY PCR (TYPE A & B)
Influenza A By PCR: POSITIVE — AB
Influenza B By PCR: NEGATIVE

## 2017-08-07 LAB — I-STAT TROPONIN, ED: Troponin i, poc: 0.01 ng/mL (ref 0.00–0.08)

## 2017-08-07 MED ORDER — ALPRAZOLAM 0.25 MG PO TABS: 0.2500 mg | ORAL_TABLET | Freq: Every day | ORAL | Status: DC | PRN

## 2017-08-07 MED ORDER — IPRATROPIUM-ALBUTEROL 0.5-2.5 (3) MG/3ML IN SOLN
3.0000 mL | Freq: Once | RESPIRATORY_TRACT | Status: AC
  Administered 2017-08-07: 3 mL via RESPIRATORY_TRACT
  Filled 2017-08-07: qty 3

## 2017-08-07 MED ORDER — MAGNESIUM OXIDE 400 (241.3 MG) MG PO TABS
400.0000 mg | ORAL_TABLET | Freq: Once | ORAL | Status: AC
  Administered 2017-08-08: 400 mg via ORAL
  Filled 2017-08-07: qty 1

## 2017-08-07 MED ORDER — PANTOPRAZOLE SODIUM 40 MG PO TBEC
40.0000 mg | DELAYED_RELEASE_TABLET | Freq: Every day | ORAL | Status: DC
  Administered 2017-08-08 – 2017-08-10 (×3): 40 mg via ORAL
  Filled 2017-08-07 (×3): qty 1

## 2017-08-07 MED ORDER — OSELTAMIVIR PHOSPHATE 75 MG PO CAPS
75.0000 mg | ORAL_CAPSULE | Freq: Two times a day (BID) | ORAL | Status: DC
Start: 2017-08-07 — End: 2017-08-10
  Administered 2017-08-07 – 2017-08-10 (×6): 75 mg via ORAL
  Filled 2017-08-07 (×6): qty 1

## 2017-08-07 MED ORDER — ONDANSETRON HCL 4 MG PO TABS: 4.0000 mg | ORAL_TABLET | Freq: Four times a day (QID) | ORAL | Status: DC | PRN

## 2017-08-07 MED ORDER — ALBUTEROL SULFATE (2.5 MG/3ML) 0.083% IN NEBU: 2.5000 mg | INHALATION_SOLUTION | RESPIRATORY_TRACT | Status: DC | PRN

## 2017-08-07 MED ORDER — PROSIGHT PO TABS
ORAL_TABLET | Freq: Two times a day (BID) | ORAL | Status: DC
  Administered 2017-08-07 – 2017-08-08 (×2): 1 via ORAL
  Filled 2017-08-07 (×5): qty 1

## 2017-08-07 MED ORDER — HYDROCODONE-ACETAMINOPHEN 5-325 MG PO TABS
1.0000 | ORAL_TABLET | Freq: Four times a day (QID) | ORAL | Status: DC | PRN
  Administered 2017-08-07: 1 via ORAL
  Filled 2017-08-07: qty 1

## 2017-08-07 MED ORDER — POTASSIUM CHLORIDE CRYS ER 20 MEQ PO TBCR
40.0000 meq | EXTENDED_RELEASE_TABLET | Freq: Once | ORAL | Status: DC
  Filled 2017-08-07: qty 2

## 2017-08-07 MED ORDER — SERTRALINE HCL 100 MG PO TABS
100.0000 mg | ORAL_TABLET | Freq: Every day | ORAL | Status: DC
  Administered 2017-08-08 – 2017-08-10 (×3): 100 mg via ORAL
  Filled 2017-08-07 (×3): qty 1

## 2017-08-07 MED ORDER — DEXTROSE-NACL 5-0.45 % IV SOLN
INTRAVENOUS | Status: AC
  Administered 2017-08-07: 20:00:00 via INTRAVENOUS

## 2017-08-07 MED ORDER — ENOXAPARIN SODIUM 40 MG/0.4ML ~~LOC~~ SOLN
40.0000 mg | SUBCUTANEOUS | Status: DC
  Administered 2017-08-07 – 2017-08-09 (×3): 40 mg via SUBCUTANEOUS
  Filled 2017-08-07 (×3): qty 0.4

## 2017-08-07 MED ORDER — IPRATROPIUM-ALBUTEROL 0.5-2.5 (3) MG/3ML IN SOLN
3.0000 mL | Freq: Three times a day (TID) | RESPIRATORY_TRACT | Status: DC
  Administered 2017-08-08 – 2017-08-10 (×7): 3 mL via RESPIRATORY_TRACT
  Filled 2017-08-07 (×7): qty 3

## 2017-08-07 MED ORDER — POLYETHYLENE GLYCOL 3350 17 G PO PACK: 17.0000 g | PACK | Freq: Every day | ORAL | Status: DC | PRN

## 2017-08-07 MED ORDER — ACETAMINOPHEN 500 MG PO TABS
1000.0000 mg | ORAL_TABLET | Freq: Every day | ORAL | Status: DC | PRN
  Administered 2017-08-08: 1000 mg via ORAL
  Filled 2017-08-07: qty 2

## 2017-08-07 MED ORDER — ONDANSETRON HCL 4 MG/2ML IJ SOLN: 4.0000 mg | Freq: Four times a day (QID) | INTRAMUSCULAR | Status: DC | PRN

## 2017-08-07 MED ORDER — SODIUM CHLORIDE 0.9 % IV BOLUS (SEPSIS)
1000.0000 mL | Freq: Once | INTRAVENOUS | Status: AC
  Administered 2017-08-07: 1000 mL via INTRAVENOUS

## 2017-08-07 MED ORDER — IPRATROPIUM-ALBUTEROL 0.5-2.5 (3) MG/3ML IN SOLN
3.0000 mL | Freq: Four times a day (QID) | RESPIRATORY_TRACT | Status: DC
  Administered 2017-08-07: 3 mL via RESPIRATORY_TRACT
  Filled 2017-08-07: qty 3

## 2017-08-07 MED ORDER — DORZOLAMIDE HCL-TIMOLOL MAL 2-0.5 % OP SOLN
1.0000 [drp] | Freq: Two times a day (BID) | OPHTHALMIC | Status: DC
  Administered 2017-08-07 – 2017-08-10 (×6): 1 [drp] via OPHTHALMIC
  Filled 2017-08-07: qty 10

## 2017-08-07 MED ORDER — PROPRANOLOL HCL 20 MG PO TABS
20.0000 mg | ORAL_TABLET | Freq: Every day | ORAL | Status: DC
  Administered 2017-08-07 – 2017-08-10 (×4): 20 mg via ORAL
  Filled 2017-08-07 (×4): qty 1

## 2017-08-07 NOTE — Progress Notes (Signed)
Attempted x 2 for report 

## 2017-08-07 NOTE — ED Notes (Signed)
Bed: WA03 Expected date:  Expected time:  Means of arrival:  Comments: 

## 2017-08-07 NOTE — Progress Notes (Signed)
Patient arrived at 13. Patient alert and oriented. 2L  of oxygen. Patient requesting to eat.

## 2017-08-07 NOTE — ED Provider Notes (Signed)
[facility] DEPT Provider Note   CSN: [id number] Arrival date & time: 08/07/17  0902     History   Chief Complaint Chief Complaint  Patient presents with  . Fall  . Shortness of Breath    HPI [name] is a 77 y.o. female with history of anxiety, osteoarthritis, history of GI bleed here for evaluation of recurrent falls in the last 24 hours. She has had 3 falls preceded by "feeling off balance" and like her legs are weak. Has also had productive cough with white sputum for 5 days and looser bowel movements for the last couple of days. 1-2 BMs a day without obvious blood, melena or hematochezia. She feels like her mouth is dry. Other associated symptoms include nasal congestion. Has R eye surgery for glaucoma states her vision is not any better. Usually ambulates without any assistance device, occasionally uses cane when going to unfamiliar terrain. Tried using her walking after her first walk but still felt off balance and fell two other more times.  She denies palpitations, CP, SOB, feeling faint before falling.  States she feels "off balance. No head trauma or LOC during falls. No other injuries from falls. Denies headaches, neck pain, fevers, chills, chest pain, shortness of breath, abdominal pain, nausea, vomiting, patient, urinary symptoms, constipation. No new asymmetrical numbness or weakness.   HPI   Past Medical History:  Diagnosis Date  . Anxiety   . Arthritis    "bilateral knees, shoulders, elbows; pretty widespread"  . BPPV (benign paroxysmal positional vertigo)   . Cancer ([place])    H/O basal skin cancer 2001  . Depression   . GERD (gastroesophageal reflux disease)   . Glaucoma   . History of blood transfusion ~ 2008   "right after but not related to left knee replacement"  . Hyperlipemia   . Lower GI bleeding ~ 2008   "had to have blood transfusion"  . Macular degeneration   . Osteopenia   . Seasonal allergies   . Sleeping  excessive   . Tinnitus of right ear     Patient Active Problem List   Diagnosis Date Noted  . Lower GI bleeding 10/14/2016  . Diverticulosis of colon 10/14/2016  . Anxiety 10/14/2016  . Lower GI bleed 10/14/2016  . Acute GI bleeding   . Osteoarthrosis, unspecified whether generalized or localized, lower leg 12/20/2011    Past Surgical History:  Procedure Laterality Date  . CATARACT EXTRACTION W/ INTRAOCULAR LENS  IMPLANT, BILATERAL  1990's  . JOINT REPLACEMENT     lft knee  . TONSILLECTOMY AND ADENOIDECTOMY  1946  . TOTAL KNEE ARTHROPLASTY  ~ 2008; 12/20/11   left; right  . TOTAL KNEE ARTHROPLASTY  12/20/2011   Procedure: TOTAL KNEE ARTHROPLASTY;  Surgeon: Augustin Schooling, MD;  Location: [place];  Service: Orthopedics;  Laterality: Right;  Right Total Knee Arthroplasty  . TUBAL LIGATION  1980's    OB History    No data available       Home Medications    Prior to Admission medications   Medication Sig Start Date End Date Taking? Authorizing Provider  acetaminophen (TYLENOL) 500 MG tablet Take 1,000 mg by mouth daily as needed for headache.   Yes [provider]  albuterol (PROVENTIL HFA;VENTOLIN HFA) 108 (90 BASE) MCG/ACT inhaler Inhale 2 puffs into the lungs daily as needed. For seasonal allergies   Yes [provider]  amoxicillin (AMOXIL) 500 MG capsule Take 2,000 mg by mouth See  admin instructions. Before a dental visit   Yes [provider]  cholecalciferol (VITAMIN D) 1000 UNITS tablet Take 2,000 Units by mouth 2 (two) times daily.    Yes [provider]  dorzolamide-timolol (COSOPT) 22.3-6.8 MG/ML ophthalmic solution Place 1 drop into both eyes 2 (two) times daily.   Yes [provider]  HYDROcodone-acetaminophen (NORCO) 5-325 MG per tablet Take 1-2 tablets by mouth every 6 (six) hours as needed for moderate pain. For pain   Yes [provider]  Multiple Vitamin (MULTIVITAMIN WITH MINERALS) TABS Take 1 tablet by mouth  daily.   Yes [provider]  Multiple Vitamins-Minerals (ICAPS AREDS 2 PO) Take 1 capsule by mouth 2 (two) times daily.   Yes [provider]  omeprazole (PRILOSEC) 20 MG capsule Take 20 mg by mouth daily.    Yes [provider]  polyethylene glycol (MIRALAX / GLYCOLAX) packet Take 17 g by mouth daily.   Yes [provider]  propranolol (INDERAL) 20 MG tablet Take 20 mg by mouth daily.   Yes [provider]  sertraline (ZOLOFT) 100 MG tablet Take 100 mg by mouth daily.   Yes [provider]  simvastatin (ZOCOR) 20 MG tablet Take 20 mg by mouth daily.   Yes [provider]  vitamin C (ASCORBIC ACID) 500 MG tablet Take 1,000 mg by mouth daily.    Yes [provider]  ALPRAZolam (XANAX) 0.25 MG tablet Take 0.25 mg by mouth daily as needed for anxiety.     [provider]  Calcium Citrate-Vitamin D (CALCIUM CITRATE + PO) Take 1,000 mg by mouth daily.    [provider]  dorzolamide (TRUSOPT) 2 % ophthalmic solution INSTILL 1 DROP INTO BOTH EYES DAILY 01/16/15   [provider]    Family History Family History  Problem Relation Age of Onset  . Heart attack Mother   . Heart disease Father   . Heart failure Father   . Bladder Cancer Father   . Supraventricular tachycardia Sister   . Hypertension Sister     Social History Social History   Tobacco Use  . Smoking status: Former Smoker    Packs/day: 1.00    Years: 20.00    Pack years: 20.00    Last attempt to quit: 07/02/1995    Years since quitting: 22.1  . Smokeless tobacco: Never Used  . Tobacco comment: 12/23/11 "quit smoking ~ 15 years ago; smoked about 20 years"  Substance Use Topics  . Alcohol use: Yes    Alcohol/week: 3.0 oz    Types: 5 Glasses of wine per week    Comment: 12/23/11 "6 oz wine, 5 days/wk"  . Drug use: No     Allergies   Morphine and related and Cefadroxil   Review of Systems Review of Systems  HENT: Positive  for congestion.   Respiratory: Positive for cough and wheezing.   Neurological: Positive for weakness (leg weakness, bilaterally).  All other systems reviewed and are negative.    Physical Exam Updated Vital Signs BP 112/78   Pulse 74   Temp 99.6 F (37.6 C) (Rectal)   Resp (!) 21   SpO2 93%   Physical Exam  Constitutional: She appears well-developed and well-nourished. No distress.  Awake. NAD.   HENT:  Head: Normocephalic and atraumatic.  Right Ear: External ear normal.  Left Ear: External ear normal.  Nose: Nose normal.  Oropharynx and tonsils normal. Moist mucous membranes. TMs normal without effusion, bulging, cloudiness. No mastoid  tenderness bilaterally. Atraumatic head.   Eyes: Conjunctivae are normal.  Neck: Normal range of motion.  No midline c-spine tenderness. Full painless PROM of neck.   Cardiovascular: Normal rate, regular rhythm and normal heart sounds.  2+ DP and radial pulses bilaterally. No LE edema. No hypotension or tachycardia.   Pulmonary/Chest: Effort normal. She has wheezes.  Expiratory wheezing in all lung fields, worse at [facility] and [facility]. SpO2 90-92% on RA. Speaking in full sentences. No obvious increase WOB  Abdominal: Soft. Bowel sounds are normal. There is no tenderness.  Musculoskeletal: Normal range of motion.  Moving all four extremities without obvious difficulty  Neurological: GCS eye subscore is 4. GCS verbal subscore is 5. GCS motor subscore is 6.  AxO to self, place, time and event.  No nystagmus.  Speech is fluent without aphasia or dysarthria.  Strength 5/5 with hand grip and ankle F/E.   Sensation to light touch intact in hands and feet. Normal patellar DTRs bilaterally.  Normal gait. No pronator drift.  Normal finger-to-nose without dysmetria.  CN I and VIII not tested. CN II-XII intact bilaterally.   Skin: Skin is warm and dry. Capillary refill takes less than 2 seconds.  Psychiatric: She has a normal mood and affect. Her behavior  is normal. Thought content normal.     ED Treatments / Results  Labs (all labs ordered are listed, but only abnormal results are displayed) Labs Reviewed  COMPREHENSIVE METABOLIC PANEL - Abnormal; Notable for the following components:      Result Value   Potassium 3.2 (*)    Chloride 100 (*)    Glucose, Bld 108 (*)    Calcium 8.7 (*)    AST 59 (*)    All other components within normal limits  CBC WITH DIFFERENTIAL/PLATELET - Abnormal; Notable for the following components:   Platelets 128 (*)    All other components within normal limits  URINALYSIS, ROUTINE W REFLEX MICROSCOPIC - Abnormal; Notable for the following components:   Ketones, ur 20 (*)    All other components within normal limits  CULTURE, BLOOD (ROUTINE X 2)  CULTURE, BLOOD (ROUTINE X 2)  URINE CULTURE  INFLUENZA PANEL BY PCR (TYPE A & B)  I-STAT CG4 LACTIC ACID, ED  I-STAT TROPONIN, ED    EKG  EKG Interpretation  Date/Time:  Thursday August 07 2017 09:57:46 EST Ventricular Rate:  76 PR Interval:    QRS Duration: 90 QT Interval:  389 QTC Calculation: 438 R Axis:   72 Text Interpretation:  Sinus rhythm Ventricular premature complex Probable left atrial enlargement Low voltage, precordial leads Confirmed by Virgel Manifold [phone]) on 08/07/2017 10:55:30 AM       Radiology Dg Chest 2 View  Result Date: 08/07/2017 CLINICAL DATA:  Cough. EXAM: CHEST  2 VIEW COMPARISON:  Radiographs of December 11, 2011. FINDINGS: The heart size and mediastinal contours are within normal limits. Both lungs are clear. Atherosclerosis of thoracic aorta is noted. No pneumothorax or pleural effusion is noted. The visualized skeletal structures are unremarkable. IMPRESSION: No active cardiopulmonary disease.  Aortic atherosclerosis. Electronically Signed   By: Marijo Conception, M.D.   On: 08/07/2017 10:46   Ct Head Wo Contrast  Result Date: 08/07/2017 CLINICAL DATA:  Several recent falls.  Difficulty with balance. EXAM: CT HEAD WITHOUT  CONTRAST TECHNIQUE: Contiguous axial images were obtained from the base of the skull through the vertex without intravenous contrast. COMPARISON:  None. FINDINGS: Brain: There is mild diffuse atrophy. There is slight invagination  of CSF into the sella. There is no intracranial mass, hemorrhage, extra-axial fluid collection, or midline shift. There is slight small vessel disease in the centra semiovale bilaterally. Elsewhere gray-white compartments appear normal. No acute infarct evident. Vascular: No appreciable hyperdense vessel. There is calcification in each carotid siphon. Skull: The bony calvarium appears intact. Sinuses/Orbits: There is opacification in several ethmoid air cells bilaterally. Other visualized paranasal sinuses are clear. Orbits appear symmetric bilaterally. Other: Mastoid air cells are clear. IMPRESSION: Mild atrophy with slight periventricular small vessel disease. No mass, hemorrhage, or evidence of acute infarct. There are foci of arterial vascular calcification. Areas of ethmoid sinus disease noted. Electronically Signed   By: Lowella Grip III M.D.   On: 08/07/2017 10:57    Procedures Procedures (including critical care time)  Medications Ordered in ED Medications  ipratropium-albuterol (DUONEB) 0.5-2.5 (3) MG/3ML nebulizer solution 3 mL (3 mLs Nebulization Given 08/07/17 1014)  sodium chloride 0.9 % bolus 1,000 mL (1,000 mLs Intravenous New Bag/Given 08/07/17 1258)     Initial Impression / Assessment and Plan / ED Course  I have reviewed the triage vital signs and the nursing notes.  Pertinent labs & imaging results that were available during my care of the patient were reviewed by me and considered in my medical decision making (see chart for details).  Clinical Course as of Aug 07 1324  Thu Aug 07, 2017  1259 Re-evaluated pt. Discussed work up so far. She desaturates to 89% RA with talkng. Remains tachypnic. She is not sure about coming into hospital for observation,  concerned about cost. Will start IVF and ambulate to check for new oxygen demands. She would like more time to think about admission   [CG]  1259 Re-evaluated pt. Discussed work up so far. She desaturates to 89% RA with talkng. Remains tachypnic. She is not sure about coming into hospital for observation, concerned about cost. Will start IVF and ambulate to check for new oxygen demands.   [CG]    Clinical Course User Index [CG] Kinnie Feil, PA-C   77 year old here for recurrent falls in the last 24 hours. Has had preceding productive cough. On exam, she has wheezing worse on the left side. 90-92% oxygen saturation on room air. No signs of trauma from falls. Neuro exam normal without unilateral weakness however she feels unsteady with walking and needs 2 person assist. At baseline, she ambulates without any help. We'll evaluate for electrolyte abnormalities, infectious process, that may be contributing. She does look dehydrated. Neuro exam is normal today, central nervous system etiology for falls slower on differential however given risk will get CT head. Duoneb, IVF ordered. Will reassess.   Final Clinical Impressions(s) / ED Diagnoses   Lab work remarkable for ketones in urine. Hypokalemia K 3.2, orally repleted with magnesium. Pt desaturating at rest to 88-89% on RA while talking. She has fallen twice despite using roller walker. Given age, risk, low SpO2 will request admission for observation for ILI causing increased oxygen demand and recurrent fall. Discussed plan with pt who is agreeable. Pt shared with SP.  Final diagnoses:  Influenza-like illness    ED Discharge Orders    None       Kinnie Feil, PA-C 08/07/17 1326    Malvin Johns, MD 08/07/17 1340

## 2017-08-07 NOTE — H&P (Signed)
History and Physical   [name] KXF:[id number] DOB: Jul 17, 1940 DOA: 08/07/2017  Referring MD/NP/PA: Carmon Sails, PA, EDP PCP: [name], MD  Patient coming from: Home  Chief Complaint: Frequent falls and dyspnea  HPI: [name] is a 77 y.o. female with a history of bilateral knee OA s/p TKA's, HLD, HTN, 30 pack year tobacco use history, osteopenia who was brought by EMS to the ED for frequent falls and weakness. She began feeling malaise, generalized weakness 3 days ago with subjective fever and chills developing 2 days ago. She noticed some "crud in her lungs" and coughing and wheezing during this time as well. Symptoms became more severe which she felt were improved with albuterol inhaler at home. She usually ambulates without assistance but has needed to use a walker and even still has fallen 3 times in the past 24 hours, the third time she consented to transport to the ED. On arrival she has temperature 8F, tachypnea, with hypoxia during longer sentences and with ambulation. She felt improved after nebulizer treatments were given, but remains reluctant to go home given her amount of weakness and dyspnea on exertion. Hospitalists consulted for evaluation with flu swab pending.   Review of Systems: Has not been eating well at all over the past 3 days. Decreased urination, no dysuria. No chest pain, abdominal pain, vomiting or diarrhea, and per HPI. All others reviewed and are negative.   Past Medical History:  Diagnosis Date  . Anxiety   . Arthritis    "bilateral knees, shoulders, elbows; pretty widespread"  . BPPV (benign paroxysmal positional vertigo)   . Cancer ([place])    H/O basal skin cancer 2001  . Depression   . GERD (gastroesophageal reflux disease)   . Glaucoma   . History of blood transfusion ~ 2008   "right after but not related to left knee replacement"  . Hyperlipemia   . Lower GI bleeding ~ 2008   "had to have blood transfusion"  . Macular degeneration   .  Osteopenia   . Seasonal allergies   . Sleeping excessive   . Tinnitus of right ear    Past Surgical History:  Procedure Laterality Date  . CATARACT EXTRACTION W/ INTRAOCULAR LENS  IMPLANT, BILATERAL  1990's  . JOINT REPLACEMENT     lft knee  . TONSILLECTOMY AND ADENOIDECTOMY  1946  . TOTAL KNEE ARTHROPLASTY  ~ 2008; 12/20/11   left; right  . TOTAL KNEE ARTHROPLASTY  12/20/2011   Procedure: TOTAL KNEE ARTHROPLASTY;  Surgeon: Augustin Schooling, MD;  Location: [facility];  Service: Orthopedics;  Laterality: Right;  Right Total Knee Arthroplasty  . TUBAL LIGATION  1980's   - Reports 30 years of smoking on average a pack per day, quit >20 years ago. No current EtOH or illicit drugs, retired, lives with husband at home.   Allergies  Allergen Reactions  . Morphine And Related Itching  . Cefadroxil Hives    Patient can take amoxicillin and cipro   Family History  Problem Relation Age of Onset  . Heart attack Mother   . Heart disease Father   . Heart failure Father   . Bladder Cancer Father   . Supraventricular tachycardia Sister   . Hypertension Sister    - Family history otherwise reviewed and not pertinent.  Prior to Admission medications   Medication Sig Start Date End Date Taking? Authorizing Provider  acetaminophen (TYLENOL) 500 MG tablet Take 1,000 mg by mouth daily as needed for headache.  Yes [provider]  albuterol (PROVENTIL HFA;VENTOLIN HFA) 108 (90 BASE) MCG/ACT inhaler Inhale 2 puffs into the lungs daily as needed. For seasonal allergies   Yes [provider]  amoxicillin (AMOXIL) 500 MG capsule Take 2,000 mg by mouth See admin instructions. Before a dental visit   Yes [provider]  cholecalciferol (VITAMIN D) 1000 UNITS tablet Take 2,000 Units by mouth 2 (two) times daily.    Yes [provider]  dorzolamide-timolol (COSOPT) 22.3-6.8 MG/ML ophthalmic solution Place 1 drop into both eyes 2 (two) times daily.   Yes [provider]  HYDROcodone-acetaminophen (NORCO) 5-325 MG per tablet Take 1-2 tablets by mouth every 6 (six) hours as needed for moderate pain. For pain   Yes [provider]  Multiple Vitamin (MULTIVITAMIN WITH MINERALS) TABS Take 1 tablet by mouth daily.   Yes [provider]  Multiple Vitamins-Minerals (ICAPS AREDS 2 PO) Take 1 capsule by mouth 2 (two) times daily.   Yes [provider]  omeprazole (PRILOSEC) 20 MG capsule Take 20 mg by mouth daily.    Yes [provider]  polyethylene glycol (MIRALAX / GLYCOLAX) packet Take 17 g by mouth daily.   Yes [provider]  propranolol (INDERAL) 20 MG tablet Take 20 mg by mouth daily.   Yes [provider]  sertraline (ZOLOFT) 100 MG tablet Take 100 mg by mouth daily.   Yes [provider]  simvastatin (ZOCOR) 20 MG tablet Take 20 mg by mouth daily.   Yes [provider]  vitamin C (ASCORBIC ACID) 500 MG tablet Take 1,000 mg by mouth daily.    Yes [provider]  ALPRAZolam (XANAX) 0.25 MG tablet Take 0.25 mg by mouth daily as needed for anxiety.     [provider]  Calcium Citrate-Vitamin D (CALCIUM CITRATE + PO) Take 1,000 mg by mouth daily.    [provider]  dorzolamide (TRUSOPT) 2 % ophthalmic solution INSTILL 1 DROP INTO BOTH EYES DAILY 01/16/15   [provider]    Physical Exam: Vitals:   08/07/17 1200 08/07/17 1227 08/07/17 1230 08/07/17 1300  BP: 137/61 137/61 112/78 133/64  Pulse: (!) 107 85 74 (!) 36  Resp: (!) 22 18 (!) 21 (!) 23  Temp:      TempSrc:      SpO2: 95% 96% 93% 91%   Constitutional: Pleasant elderly female in no distress, calm demeanor Eyes: Lids and conjunctivae normal, PERRL ENMT: Mucous membranes are dry. Posterior pharynx clear of any exudate or lesions. Fair dentition.  Neck: Normal, supple, no masses, no thyromegaly Respiratory: Mildly labored with longer sentences, tachypneic with significant rhonchi bilaterally  without crackles. No inspiratory sounds. Cardiovascular: Regular rate and rhythm, no murmurs, rubs, or gallops. No carotid bruits. No JVD. No LE edema. 2+ pedal pulses. Abdomen: Normoactive bowel sounds. No tenderness, non-distended, and no masses palpated. No hepatosplenomegaly. GU: No indwelling catheter Musculoskeletal: No clubbing / cyanosis. No joint deformity upper and lower extremities. Good ROM, no contractures. Normal muscle tone.  Skin: Warm, dry. No rashes, wounds, ulcers. Neurologic: CN II-XII grossly intact. Gait narrow based but very slow, requires assistance for balance. Speech normal. No focal deficits in motor strength or sensation in all extremities.  Psychiatric: Alert and oriented x3. Normal judgment and insight. Mood anxious with congruent affect.   Labs on Admission: I have personally reviewed following labs and imaging studies  CBC: Recent Labs  Lab 08/07/17 0949  WBC 5.9  NEUTROABS 4.0  HGB 13.0  HCT 40.3  MCV 88.8  PLT 973*   Basic Metabolic Panel: Recent Labs  Lab 08/07/17 0949  NA 136  K 3.2*  CL 100*  CO2 26  GLUCOSE 108*  BUN 11  CREATININE 0.59  CALCIUM 8.7*   Liver Function Tests: Recent Labs  Lab 08/07/17 0949  AST 59*  ALT 20  ALKPHOS 56  BILITOT 0.7  PROT 6.5  ALBUMIN 3.6   Urine analysis:    Component Value Date/Time   COLORURINE YELLOW 08/07/2017 [place] 08/07/2017 0949   LABSPEC 1.010 08/07/2017 0949   PHURINE 5.0 08/07/2017 0949   GLUCOSEU NEGATIVE 08/07/2017 0949   HGBUR NEGATIVE 08/07/2017 0949   BILIRUBINUR NEGATIVE 08/07/2017 0949   KETONESUR 20 (A) 08/07/2017 0949   PROTEINUR NEGATIVE 08/07/2017 0949   UROBILINOGEN 0.2 10/20/2007 2015   NITRITE NEGATIVE 08/07/2017 0949   LEUKOCYTESUR NEGATIVE 08/07/2017 0949   Radiological Exams on Admission: Dg Chest 2 View  Result Date: 08/07/2017 CLINICAL DATA:  Cough. EXAM: CHEST  2 VIEW COMPARISON:  Radiographs of December 11, 2011. FINDINGS: The heart size  and mediastinal contours are within normal limits. Both lungs are clear. Atherosclerosis of thoracic aorta is noted. No pneumothorax or pleural effusion is noted. The visualized skeletal structures are unremarkable. IMPRESSION: No active cardiopulmonary disease.  Aortic atherosclerosis. Electronically Signed   By: Marijo Conception, M.D.   On: 08/07/2017 10:46   Ct Head Wo Contrast  Result Date: 08/07/2017 CLINICAL DATA:  Several recent falls.  Difficulty with balance. EXAM: CT HEAD WITHOUT CONTRAST TECHNIQUE: Contiguous axial images were obtained from the base of the skull through the vertex without intravenous contrast. COMPARISON:  None. FINDINGS: Brain: There is mild diffuse atrophy. There is slight invagination of CSF into the sella. There is no intracranial mass, hemorrhage, extra-axial fluid collection, or midline shift. There is slight small vessel disease in the centra semiovale bilaterally. Elsewhere gray-white compartments appear normal. No acute infarct evident. Vascular: No appreciable hyperdense vessel. There is calcification in each carotid siphon. Skull: The bony calvarium appears intact. Sinuses/Orbits: There is opacification in several ethmoid air cells bilaterally. Other visualized paranasal sinuses are clear. Orbits appear symmetric bilaterally. Other: Mastoid air cells are clear. IMPRESSION: Mild atrophy with slight periventricular small vessel disease. No mass, hemorrhage, or evidence of acute infarct. There are foci of arterial vascular calcification. Areas of ethmoid sinus disease noted. Electronically Signed   By: Lowella Grip III M.D.   On: 08/07/2017 10:57    EKG: Independently reviewed. NSR, no ischemic changes noted.  Assessment/Plan Principal Problem:   Acute respiratory failure with hypoxia (HCC) Active Problems:   Anxiety   Depression   Hyperlipemia   Macular degeneration   Glaucoma   Acute hypoxic respiratory failure due to viral bronchitis: No infiltrate on CXR,  no evidence of volume overload or sepsis. Suspect age and longterm smoking history remotely has decreased pulmonary reserve enough to have this tip her over the edge.  - Continue supplemental oxygen as needed. - Flu swab pending. Treat as indicated.  - Scheduled and prn bronchodilators - CM consulted, suspect pt would benefit from nebulizer at home and PFTs as outpatient  Weakness: Due to viral infection, osteoarthritis.  - PT evaluation  Dehydration: Poor per oral intake as evidenced by ketonuria and hypokalemia.  - Replace electrolytes as ordered, recheck in AM - Given IVF's, will continue modest fluids and give antiemetic prn, liberalize diet.  Hyperlipidemia:  - Due to concerns for  myalgias, will hold simvastatin, recommend outpatient follow up  Glaucoma and macular degeneration: Continue home medications  HTN: Continue propranolol  Depression/anxiety: Continue SSRI and prn low dose xanax.   DVT prophylaxis: Lovenox  Code Status: Full  Family Communication: Husband at bedside Disposition Plan: Uncertain Consults called: None  Admission status: Observation    Vance Gather, MD [facility] Pager [phone]  If 7PM-7AM, please contact night-coverage [email or web address] Password [facility] 08/07/2017, 2:47 PM

## 2017-08-07 NOTE — ED Notes (Signed)
Patient ambulated to the restroom and back to her room with assistance. Patient's oxygen saturations dropped as low as 88% during the walk. EDPA notified.

## 2017-08-07 NOTE — ED Triage Notes (Addendum)
Patient BIB EMS from home where she lives with her husband. EMS was called out for frequent falls. Patient fell twice yesterday and patient refused transport. Patient fell again today. Patient denies neck or back pain.  Patient denies LOC/head injury for any of her falls. Patient reports "losing her balance" upon standing. Patient denies dizziness. Patient denies injury or pain. States " I just want this checked out so I wont fall again." Patient also reports wheezing- patient reports attempting to use her inhaler at home, without relief. EMS administered breathing treatment en route. EMS vitals: HR104, Sat 90% RA, Sat 96% after treatment, BP 165/133, 20g Right AC PIV placed by EMS.

## 2017-08-08 DIAGNOSIS — F419 Anxiety disorder, unspecified: Secondary | ICD-10-CM | POA: Diagnosis present

## 2017-08-08 DIAGNOSIS — H409 Unspecified glaucoma: Secondary | ICD-10-CM | POA: Diagnosis not present

## 2017-08-08 DIAGNOSIS — J9601 Acute respiratory failure with hypoxia: Secondary | ICD-10-CM | POA: Diagnosis not present

## 2017-08-08 DIAGNOSIS — R0602 Shortness of breath: Secondary | ICD-10-CM | POA: Diagnosis present

## 2017-08-08 DIAGNOSIS — R296 Repeated falls: Secondary | ICD-10-CM | POA: Diagnosis present

## 2017-08-08 DIAGNOSIS — Z96653 Presence of artificial knee joint, bilateral: Secondary | ICD-10-CM | POA: Diagnosis present

## 2017-08-08 DIAGNOSIS — H3530 Unspecified macular degeneration: Secondary | ICD-10-CM | POA: Diagnosis not present

## 2017-08-08 DIAGNOSIS — J101 Influenza due to other identified influenza virus with other respiratory manifestations: Secondary | ICD-10-CM | POA: Diagnosis not present

## 2017-08-08 DIAGNOSIS — Z8249 Family history of ischemic heart disease and other diseases of the circulatory system: Secondary | ICD-10-CM | POA: Diagnosis not present

## 2017-08-08 DIAGNOSIS — E785 Hyperlipidemia, unspecified: Secondary | ICD-10-CM | POA: Diagnosis not present

## 2017-08-08 DIAGNOSIS — K219 Gastro-esophageal reflux disease without esophagitis: Secondary | ICD-10-CM | POA: Diagnosis present

## 2017-08-08 DIAGNOSIS — I10 Essential (primary) hypertension: Secondary | ICD-10-CM | POA: Diagnosis present

## 2017-08-08 DIAGNOSIS — J209 Acute bronchitis, unspecified: Secondary | ICD-10-CM | POA: Diagnosis present

## 2017-08-08 DIAGNOSIS — E860 Dehydration: Secondary | ICD-10-CM

## 2017-08-08 DIAGNOSIS — R69 Illness, unspecified: Secondary | ICD-10-CM | POA: Diagnosis not present

## 2017-08-08 DIAGNOSIS — E876 Hypokalemia: Secondary | ICD-10-CM | POA: Diagnosis present

## 2017-08-08 DIAGNOSIS — F329 Major depressive disorder, single episode, unspecified: Secondary | ICD-10-CM | POA: Diagnosis present

## 2017-08-08 DIAGNOSIS — Z85828 Personal history of other malignant neoplasm of skin: Secondary | ICD-10-CM | POA: Diagnosis not present

## 2017-08-08 DIAGNOSIS — Z87891 Personal history of nicotine dependence: Secondary | ICD-10-CM | POA: Diagnosis not present

## 2017-08-08 LAB — BASIC METABOLIC PANEL
Anion gap: 9 (ref 5–15)
BUN: 10 mg/dL (ref 6–20)
CALCIUM: 8.4 mg/dL — AB (ref 8.9–10.3)
CO2: 25 mmol/L (ref 22–32)
CREATININE: 0.47 mg/dL (ref 0.44–1.00)
Chloride: 100 mmol/L — ABNORMAL LOW (ref 101–111)
GFR calc Af Amer: >60 mL/min (ref >60)
GLUCOSE: 126 mg/dL — AB (ref 65–99)
Potassium: 3.1 mmol/L — ABNORMAL LOW (ref 3.5–5.1)
Sodium: 134 mmol/L — ABNORMAL LOW (ref 135–145)

## 2017-08-08 LAB — URINE CULTURE

## 2017-08-08 LAB — MAGNESIUM: Magnesium: 2 mg/dL (ref 1.7–2.4)

## 2017-08-08 MED ORDER — GUAIFENESIN-DM 100-10 MG/5ML PO SYRP
5.0000 mL | ORAL_SOLUTION | ORAL | Status: DC | PRN
  Administered 2017-08-08: 5 mL via ORAL
  Filled 2017-08-08: qty 10

## 2017-08-08 MED ORDER — BUDESONIDE 0.5 MG/2ML IN SUSP
0.5000 mg | Freq: Two times a day (BID) | RESPIRATORY_TRACT | Status: DC
  Administered 2017-08-08 – 2017-08-10 (×5): 0.5 mg via RESPIRATORY_TRACT
  Filled 2017-08-08 (×5): qty 2

## 2017-08-08 MED ORDER — FLUTICASONE PROPIONATE 50 MCG/ACT NA SUSP
2.0000 | Freq: Every day | NASAL | Status: DC
  Administered 2017-08-08: 2 via NASAL
  Filled 2017-08-08: qty 16

## 2017-08-08 MED ORDER — POTASSIUM CHLORIDE CRYS ER 20 MEQ PO TBCR
40.0000 meq | EXTENDED_RELEASE_TABLET | ORAL | Status: AC
  Administered 2017-08-08 (×2): 40 meq via ORAL
  Filled 2017-08-08 (×2): qty 2

## 2017-08-08 MED ORDER — GUAIFENESIN ER 600 MG PO TB12
1200.0000 mg | ORAL_TABLET | Freq: Two times a day (BID) | ORAL | Status: DC
  Administered 2017-08-08 – 2017-08-10 (×5): 1200 mg via ORAL
  Filled 2017-08-08 (×5): qty 2

## 2017-08-08 MED ORDER — AMOXICILLIN-POT CLAVULANATE 875-125 MG PO TABS
1.0000 | ORAL_TABLET | Freq: Two times a day (BID) | ORAL | Status: DC
  Administered 2017-08-08 – 2017-08-10 (×5): 1 via ORAL
  Filled 2017-08-08 (×5): qty 1

## 2017-08-08 MED ORDER — METHYLPREDNISOLONE SODIUM SUCC 125 MG IJ SOLR
60.0000 mg | Freq: Once | INTRAMUSCULAR | Status: AC
  Administered 2017-08-08: 60 mg via INTRAVENOUS
  Filled 2017-08-08: qty 2

## 2017-08-08 MED ORDER — LORATADINE 10 MG PO TABS
10.0000 mg | ORAL_TABLET | Freq: Every day | ORAL | Status: DC
  Administered 2017-08-08 – 2017-08-10 (×3): 10 mg via ORAL
  Filled 2017-08-08 (×3): qty 1

## 2017-08-08 MED ORDER — PROSIGHT PO TABS
1.0000 | ORAL_TABLET | Freq: Two times a day (BID) | ORAL | Status: DC
  Administered 2017-08-08 – 2017-08-10 (×4): 1 via ORAL
  Filled 2017-08-08 (×4): qty 1

## 2017-08-08 NOTE — Care Management Note (Signed)
Case Management Note  Patient Details  Name: [name] MRN: [id number] Date of Birth: 1941-03-08  Subjective/Objective:                  Falls and dyspnea on o2  Action/Plan: Date: August 08, 2017 Velva Harman, BSN, [place], [place] Chart and notes review for patient progress and needs. Will follow for case management and discharge needs. No cm or discharge needs present at time of this review. Next review date: [id number]  Expected Discharge Date:  (unknown)               Expected Discharge Plan:  Home/Self Care  In-House Referral:     Discharge planning Services  CM Consult  Post Acute Care Choice:    Choice offered to:     DME Arranged:    DME Agency:     HH Arranged:    HH Agency:     Status of Service:  In process, will continue to follow  If discussed at Long Length of Stay Meetings, dates discussed:    Additional Comments:  Leeroy Cha, RN 08/08/2017, 8:58 AM

## 2017-08-08 NOTE — Evaluation (Signed)
Physical Therapy Evaluation Patient Details Name: [name] MRN: [id number] DOB: 1941-05-15 Today's Date: 08/08/2017   History of Present Illness  77 yo female admitted with acute respiratory failure, viral bronchitis, +flu. Hx of TKA, osteopenia, HTN, frequent falls, BPPV.    Clinical Impression  On eval, pt required Min assist for mobility. She walked ~65 feet with a RW. Pt presents with general weakness, decreased activity tolerance, and impaired gait and balance. O2 sat 88-89% on RA during ambulation. Dyspnea 2/4 with activity. Will continue to follow and progress activity as tolerated.     Follow Up Recommendations Home health PT;Supervision/Assistance - 24 hour    Equipment Recommendations  None recommended by PT    Recommendations for Other Services       Precautions / Restrictions Precautions Precautions: Fall Precaution Comments: droplet. monitor O2 sat Restrictions Weight Bearing Restrictions: No      Mobility  Bed Mobility Overal bed mobility: Needs Assistance Bed Mobility: Supine to Sit     Supine to sit: Supervision     General bed mobility comments: for safety  Transfers Overall transfer level: Needs assistance Equipment used: Rolling walker (2 wheeled) Transfers: Sit to/from Stand Sit to Stand: Min assist         General transfer comment: Assist to rise, stabilize, control descent. VCs safety, hand placement  Ambulation/Gait Ambulation/Gait assistance: Min guard Ambulation Distance (Feet): 65 Feet Assistive device: Rolling walker (2 wheeled) Gait Pattern/deviations: Step-through pattern;Decreased stride length     General Gait Details: close guard for safety. O2 sat 88-89% on RA.   Stairs            Wheelchair Mobility    Modified Rankin (Stroke Patients Only)       Balance                                             Pertinent Vitals/Pain Pain Assessment: No/denies pain    Home Living Family/patient  expects to be discharged to:: Private residence Living Arrangements: Spouse/significant other Available Help at Discharge: Family Type of Home: House Home Access: Stairs to enter Entrance Stairs-Rails: Left Entrance Stairs-Number of Steps: 3 Home Layout: One level Home Equipment: Environmental consultant - 2 wheels;Cane - quad;Bedside commode      Prior Function Level of Independence: Independent               Hand Dominance        Extremity/Trunk Assessment   Upper Extremity Assessment Upper Extremity Assessment: Generalized weakness    Lower Extremity Assessment Lower Extremity Assessment: Generalized weakness    Cervical / Trunk Assessment Cervical / Trunk Assessment: Normal  Communication   Communication: No difficulties  Cognition Arousal/Alertness: Awake/alert Behavior During Therapy: WFL for tasks assessed/performed Overall Cognitive Status: Within Functional Limits for tasks assessed                                        General Comments      Exercises     Assessment/Plan    PT Assessment Patient needs continued PT services  PT Problem List Decreased mobility;Decreased activity tolerance;Decreased strength;Decreased balance       PT Treatment Interventions DME instruction;Gait training;Functional mobility training;Therapeutic activities;Balance training;Patient/family education;Therapeutic exercise    PT Goals (Current goals can be found in the  Care Plan section)  Acute Rehab PT Goals Patient Stated Goal: to regain independence and PLOF.  PT Goal Formulation: With patient Time For Goal Achievement: 08/22/17 Potential to Achieve Goals: Good    Frequency Min 3X/week   Barriers to discharge        Co-evaluation               AM-PAC PT "6 Clicks" Daily Activity  Outcome Measure Difficulty turning over in bed (including adjusting bedclothes, sheets and blankets)?: A Little Difficulty moving from lying on back to sitting on the side  of the bed? : A Little Difficulty sitting down on and standing up from a chair with arms (e.g., wheelchair, bedside commode, etc,.)?: Unable Help needed moving to and from a bed to chair (including a wheelchair)?: A Little Help needed walking in hospital room?: A Little Help needed climbing 3-5 steps with a railing? : A Lot 6 Click Score: 15    End of Session Equipment Utilized During Treatment: Gait belt Activity Tolerance: Patient tolerated treatment well Patient left: in chair;with chair alarm set   PT Visit Diagnosis: Muscle weakness (generalized) (M62.81);Difficulty in walking, not elsewhere classified (R26.2)    Time: 3254-9826 PT Time Calculation (min) (ACUTE ONLY): 34 min   Charges:   PT Evaluation $PT Eval Moderate Complexity: 1 Mod PT Treatments $Gait Training: 8-22 mins   PT G Codes:          Weston Anna, MPT Pager: [phone]

## 2017-08-08 NOTE — Progress Notes (Signed)
Pt instructed on use of Flutter Valve.  Pt demonstrated with good effort and technique.  Pt has productive cough, RT to monitor and assess as needed.

## 2017-08-08 NOTE — Progress Notes (Signed)
PROGRESS NOTE    [name]  ZOX:[id number] DOB: February 23, 1941 DOA: 08/07/2017 PCP: [name], MD    Brief Narrative:  [name] is a 77 y.o. female with a history of bilateral knee OA s/p TKA's, HLD, HTN, 30 pack year tobacco use history, osteopenia who was brought by EMS to the ED for frequent falls and weakness. She began feeling malaise, generalized weakness 3 days ago with subjective fever and chills developing 2 days ago. She noticed some "crud in her lungs" and coughing and wheezing during this time as well. Symptoms became more severe which she felt were improved with albuterol inhaler at home. She usually ambulates without assistance but has needed to use a walker and even still has fallen 3 times in the past 24 hours, the third time she consented to transport to the ED. On arrival she has temperature 71F, tachypnea, with hypoxia during longer sentences and with ambulation. She felt improved after nebulizer treatments were given, but remains reluctant to go home given her amount of weakness and dyspnea on exertion. Hospitalists consulted for evaluation with flu swab pending.       Assessment & Plan:   Principal Problem:   Acute respiratory failure with hypoxia (HCC) Active Problems:   Influenza A   Anxiety   Depression   Hyperlipemia   Macular degeneration   Glaucoma   Weakness   Dehydration  1 acute respiratory failure with hypoxia secondary to influenza A/acute bronchitis Patient had presented with worsening shortness of breath on admission, subjective fever and chills, coughing with wheezing and noted on arrival in the ED to be hypoxic during longer sentences and with ambulation.  Influenza PCR obtained was positive for influenza A.  Patient with sats of 88-89% on room air during ambulation.  Patient improving slowly and clinically.  Continue Tamiflu.  We will add Pulmicort nebulizers.  Continue scheduled duo nebs.  Add Claritin, Flonase, Mucinex, PPI.  Give a dose of  IV Solu-Medrol 60 mg x1.  Chest PT.  Flutter valve.  Will also add Augmentin.  2.  Dehydration IV fluids.  3.  Depression/anxiety Currently stable.  Continue home regimen Zoloft and propranolol.  4.  Macular Degeneration Continue eyedrops.  5.  Weakness PT/OT.   DVT prophylaxis: Lovenox Code Status: Full Family Communication: Updated patient.  No family at bedside. Disposition Plan: Likely home with home health, once medically stable with clinical improvement.   Consultants:   None  Procedures:  Chest x-ray 08/07/2017  CT head 08/07/2017  Chest x-ray 08/07/2017    Antimicrobials:   Augmentin 08/08/2017   Subjective: Patient sitting up in chair states some improvement since admission however not at baseline.  Denies any chest pain.  Shortness of breath slowly improving.  Patient with a productive cough.  Objective: Vitals:   08/07/17 2230 08/08/17 0503 08/08/17 0859 08/08/17 1008  BP: (!) 109/47 130/61  (!) 130/56  Pulse: 71 86  83  Resp: 19 18    Temp: 98.2 F (36.8 C) 98.3 F (36.8 C)    TempSrc: Oral Oral    SpO2: 92% 98% 96%     Intake/Output Summary (Last 24 hours) at 08/08/2017 1149 Last data filed at 08/08/2017 0559 Gross per 24 hour  Intake 777.5 ml  Output -  Net 777.5 ml   There were no vitals filed for this visit.  Examination:  General exam: Appears calm and comfortable  Respiratory system: Coarse rhonchorous breath sounds noted.  Minimal to mild expiratory wheezing.  No  crackles.  Respiratory effort normal. Cardiovascular system: S1 & S2 heard, RRR. No JVD, murmurs, rubs, gallops or clicks. No pedal edema. Gastrointestinal system: Abdomen is soft, nontender, nondistended, positive bowel sounds.  No hepatosplenomegaly.  Central nervous system: Alert and oriented. No focal neurological deficits. Extremities: Symmetric 5 x 5 power. Skin: No rashes, lesions or ulcers Psychiatry: Judgement and insight appear normal. Mood & affect appropriate.      Data Reviewed: I have personally reviewed following labs and imaging studies  CBC: Recent Labs  Lab 08/07/17 0949  WBC 5.9  NEUTROABS 4.0  HGB 13.0  HCT 40.3  MCV 88.8  PLT 716*   Basic Metabolic Panel: Recent Labs  Lab 08/07/17 0949 08/08/17 0629  NA 136 134*  K 3.2* 3.1*  CL 100* 100*  CO2 26 25  GLUCOSE 108* 126*  BUN 11 10  CREATININE 0.59 0.47  CALCIUM 8.7* 8.4*  MG  --  2.0   GFR: CrCl cannot be calculated (Unknown ideal weight.). Liver Function Tests: Recent Labs  Lab 08/07/17 0949  AST 59*  ALT 20  ALKPHOS 56  BILITOT 0.7  PROT 6.5  ALBUMIN 3.6   No results for input(s): LIPASE, AMYLASE in the last 168 hours. No results for input(s): AMMONIA in the last 168 hours. Coagulation Profile: No results for input(s): INR, PROTIME in the last 168 hours. Cardiac Enzymes: No results for input(s): CKTOTAL, CKMB, CKMBINDEX, TROPONINI in the last 168 hours. BNP (last 3 results) No results for input(s): PROBNP in the last 8760 hours. HbA1C: No results for input(s): HGBA1C in the last 72 hours. CBG: No results for input(s): GLUCAP in the last 168 hours. Lipid Profile: No results for input(s): CHOL, HDL, LDLCALC, TRIG, CHOLHDL, LDLDIRECT in the last 72 hours. Thyroid Function Tests: No results for input(s): TSH, T4TOTAL, FREET4, T3FREE, THYROIDAB in the last 72 hours. Anemia Panel: No results for input(s): VITAMINB12, FOLATE, FERRITIN, TIBC, IRON, RETICCTPCT in the last 72 hours. Sepsis Labs: Recent Labs  Lab 08/07/17 0955  LATICACIDVEN 1.31    No results found for this or any previous visit (from the past 240 hour(s)).       Radiology Studies: Dg Chest 2 View  Result Date: 08/07/2017 CLINICAL DATA:  Cough. EXAM: CHEST  2 VIEW COMPARISON:  Radiographs of December 11, 2011. FINDINGS: The heart size and mediastinal contours are within normal limits. Both lungs are clear. Atherosclerosis of thoracic aorta is noted. No pneumothorax or pleural effusion  is noted. The visualized skeletal structures are unremarkable. IMPRESSION: No active cardiopulmonary disease.  Aortic atherosclerosis. Electronically Signed   By: Marijo Conception, M.D.   On: 08/07/2017 10:46   Ct Head Wo Contrast  Result Date: 08/07/2017 CLINICAL DATA:  Several recent falls.  Difficulty with balance. EXAM: CT HEAD WITHOUT CONTRAST TECHNIQUE: Contiguous axial images were obtained from the base of the skull through the vertex without intravenous contrast. COMPARISON:  None. FINDINGS: Brain: There is mild diffuse atrophy. There is slight invagination of CSF into the sella. There is no intracranial mass, hemorrhage, extra-axial fluid collection, or midline shift. There is slight small vessel disease in the centra semiovale bilaterally. Elsewhere gray-white compartments appear normal. No acute infarct evident. Vascular: No appreciable hyperdense vessel. There is calcification in each carotid siphon. Skull: The bony calvarium appears intact. Sinuses/Orbits: There is opacification in several ethmoid air cells bilaterally. Other visualized paranasal sinuses are clear. Orbits appear symmetric bilaterally. Other: Mastoid air cells are clear. IMPRESSION: Mild atrophy with slight periventricular small vessel  disease. No mass, hemorrhage, or evidence of acute infarct. There are foci of arterial vascular calcification. Areas of ethmoid sinus disease noted. Electronically Signed   By: Lowella Grip III M.D.   On: 08/07/2017 10:57        Scheduled Meds: . amoxicillin-clavulanate  1 tablet Oral Q12H  . budesonide (PULMICORT) nebulizer solution  0.5 mg Nebulization BID  . dorzolamide-timolol  1 drop Both Eyes BID  . enoxaparin (LOVENOX) injection  40 mg Subcutaneous Q24H  . fluticasone  2 spray Each Nare Daily  . guaiFENesin  1,200 mg Oral BID  . ipratropium-albuterol  3 mL Nebulization TID  . loratadine  10 mg Oral Daily  . magnesium oxide  400 mg Oral Once  . methylPREDNISolone (SOLU-MEDROL)  injection  60 mg Intravenous Once  . multivitamin   Oral BID  . oseltamivir  75 mg Oral BID  . pantoprazole  40 mg Oral Daily  . potassium chloride  40 mEq Oral Once  . potassium chloride  40 mEq Oral Q4H  . propranolol  20 mg Oral Daily  . sertraline  100 mg Oral Daily   Continuous Infusions:   LOS: 0 days    Time spent: 35 minutes    Irine Seal, MD [facility] Pager [phone]  If 7PM-7AM, please contact night-coverage [email or web address] Password [facility] 08/08/2017, 11:49 AM

## 2017-08-09 DIAGNOSIS — H409 Unspecified glaucoma: Secondary | ICD-10-CM

## 2017-08-09 LAB — BASIC METABOLIC PANEL
Anion gap: 5 (ref 5–15)
BUN: 9 mg/dL (ref 6–20)
CHLORIDE: 106 mmol/L (ref 101–111)
CO2: 29 mmol/L (ref 22–32)
CREATININE: 0.48 mg/dL (ref 0.44–1.00)
Calcium: 8.8 mg/dL — ABNORMAL LOW (ref 8.9–10.3)
GFR calc Af Amer: >60 mL/min (ref >60)
GFR calc non Af Amer: >60 mL/min (ref >60)
Glucose, Bld: 123 mg/dL — ABNORMAL HIGH (ref 65–99)
Potassium: 4.6 mmol/L (ref 3.5–5.1)
SODIUM: 140 mmol/L (ref 135–145)

## 2017-08-09 LAB — CBC
HEMATOCRIT: 37.9 % (ref 36.0–46.0)
Hemoglobin: 12.1 g/dL (ref 12.0–15.0)
MCH: 28.5 pg (ref 26.0–34.0)
MCHC: 31.9 g/dL (ref 30.0–36.0)
MCV: 89.4 fL (ref 78.0–100.0)
Platelets: 128 10*3/uL — ABNORMAL LOW (ref 150–400)
RBC: 4.24 MIL/uL (ref 3.87–5.11)
RDW: 16.1 % — ABNORMAL HIGH (ref 11.5–15.5)
WBC: 2.5 10*3/uL — ABNORMAL LOW (ref 4.0–10.5)

## 2017-08-09 LAB — MAGNESIUM: MAGNESIUM: 2.2 mg/dL (ref 1.7–2.4)

## 2017-08-09 MED ORDER — PREDNISONE 50 MG PO TABS
60.0000 mg | ORAL_TABLET | Freq: Every day | ORAL | Status: DC
  Administered 2017-08-09 – 2017-08-10 (×2): 60 mg via ORAL
  Filled 2017-08-09 (×2): qty 1

## 2017-08-09 NOTE — Progress Notes (Signed)
SATURATION QUALIFICATIONS: (This note is used to comply with regulatory documentation for home oxygen)  Patient Saturations on Room Air at Rest = 95%  Patient Saturations on Room Air while Ambulating = 93%-95%  0Liters of oxygen while Ambulating = 93%-95%  Please briefly explain why patient needs home oxygen: No need for home O2   Reinette Cuneo, Barbee Shropshire, RN

## 2017-08-09 NOTE — Progress Notes (Signed)
PT Cancellation Note  Patient Details Name: TIMERA WINDT MRN: [id number] DOB: 02-Sep-1940   Cancelled Treatment:     pt amb in hallway with nursing using RW and on RA.  Pt stated she has a walker at home.  Pt feeling better every day.     Rica Koyanagi  PTA [facility]  Acute  Rehab Pager      [phone]

## 2017-08-09 NOTE — Progress Notes (Signed)
PT demonstrated hands on understanding of Flutter device. 

## 2017-08-09 NOTE — Progress Notes (Signed)
PROGRESS NOTE    [name]  WUJ:[id number] DOB: [birth year] DOA: 08/07/2017 PCP: [name], MD    Brief Narrative:  [name] is a 77 y.o. female with a history of bilateral knee OA s/p TKA's, HLD, HTN, 30 pack year tobacco use history, osteopenia who was brought by EMS to the ED for frequent falls and weakness. She began feeling malaise, generalized weakness 3 days ago with subjective fever and chills developing 2 days ago. She noticed some "crud in her lungs" and coughing and wheezing during this time as well. Symptoms became more severe which she felt were improved with albuterol inhaler at home. She usually ambulates without assistance but has needed to use a walker and even still has fallen 3 times in the past 24 hours, the third time she consented to transport to the ED. On arrival she has temperature 40F, tachypnea, with hypoxia during longer sentences and with ambulation. She felt improved after nebulizer treatments were given, but remains reluctant to go home given her amount of weakness and dyspnea on exertion. Hospitalists consulted for evaluation with flu swab pending.       Assessment & Plan:   Principal Problem:   Acute respiratory failure with hypoxia (HCC) Active Problems:   Influenza A   Anxiety   Depression   Hyperlipemia   Macular degeneration   Glaucoma   Weakness   Dehydration  1 acute respiratory failure with hypoxia secondary to influenza A/acute bronchitis Patient had presented with worsening shortness of breath on admission, subjective fever and chills, coughing with wheezing and noted on arrival in the ED to be hypoxic during longer sentences and with ambulation.  Influenza PCR obtained was positive for influenza A.  Patient with sats of 88-89% on room air during ambulation.  Hypoxia improving daily.  Patient improving clinically.  Continue Tamiflu, Pulmicort, scheduled duo nebs, Claritin, Flonase, Mucinex, PPI.  Placed on oral prednisone taper due to  wheezing.  Continue Augmentin and treat for total of 5 days.  Chest PT.  Flutter valve.    2.  Dehydration IV fluids.  3.  Depression/anxiety Stable.  Continue home regimen of propranolol and Zoloft.    4.  Macular Degeneration Continue eyedrops.  5.  Weakness PT/OT.  Likely needs home health therapies.   DVT prophylaxis: Lovenox Code Status: Full Family Communication: Updated patient.  No family at bedside. Disposition Plan: Likely home with home health, once medically stable with clinical improvement hopefully in the next 24 hours.   Consultants:   None  Procedures:  Chest x-ray 08/07/2017  CT head 08/07/2017  Chest x-ray 08/07/2017    Antimicrobials:   Augmentin 08/08/2017   Subjective: Patient sitting up in chair.  States shortness of breath has improved.  Patient feels strength is improving.  Denies any chest pain.    Objective: Vitals:   08/08/17 2222 08/09/17 0606 08/09/17 0833 08/09/17 1009  BP: (!) 126/55 118/64    Pulse: 69 61 93   Resp: 18 18    Temp: 98 F (36.7 C) 97.7 F (36.5 C)    TempSrc: Oral Oral    SpO2: 95% 95% 98% 95%    Intake/Output Summary (Last 24 hours) at 08/09/2017 1249 Last data filed at 08/09/2017 0606 Gross per 24 hour  Intake -  Output 250 ml  Net -250 ml   There were no vitals filed for this visit.  Examination:  General exam: Appears calm and comfortable  Respiratory system: Less coarse rhonchorous breath sounds.  Mild  expiratory wheezing.  No crackles.   Respiratory effort normal. Cardiovascular system: Regular rate rhythm no murmurs rubs or gallops.  No JVD.  No pedal edema.  Gastrointestinal system: Abdomen is nontender, nondistended, soft, positive bowel sounds.  No hepatosplenomegaly.  Central nervous system: Alert and oriented. No focal neurological deficits. Extremities: Symmetric 5 x 5 power. Skin: No rashes, lesions or ulcers Psychiatry: Judgement and insight appear normal. Mood & affect appropriate.      Data Reviewed: I have personally reviewed following labs and imaging studies  CBC: Recent Labs  Lab 08/07/17 0949 08/09/17 0616  WBC 5.9 2.5*  NEUTROABS 4.0  --   HGB 13.0 12.1  HCT 40.3 37.9  MCV 88.8 89.4  PLT 128* 562*   Basic Metabolic Panel: Recent Labs  Lab 08/07/17 0949 08/08/17 0629 08/09/17 0616  NA 136 134* 140  K 3.2* 3.1* 4.6  CL 100* 100* 106  CO2 26 25 29   GLUCOSE 108* 126* 123*  BUN 11 10 9   CREATININE 0.59 0.47 0.48  CALCIUM 8.7* 8.4* 8.8*  MG  --  2.0 2.2   GFR: CrCl cannot be calculated (Unknown ideal weight.). Liver Function Tests: Recent Labs  Lab 08/07/17 0949  AST 59*  ALT 20  ALKPHOS 56  BILITOT 0.7  PROT 6.5  ALBUMIN 3.6   No results for input(s): LIPASE, AMYLASE in the last 168 hours. No results for input(s): AMMONIA in the last 168 hours. Coagulation Profile: No results for input(s): INR, PROTIME in the last 168 hours. Cardiac Enzymes: No results for input(s): CKTOTAL, CKMB, CKMBINDEX, TROPONINI in the last 168 hours. BNP (last 3 results) No results for input(s): PROBNP in the last 8760 hours. HbA1C: No results for input(s): HGBA1C in the last 72 hours. CBG: No results for input(s): GLUCAP in the last 168 hours. Lipid Profile: No results for input(s): CHOL, HDL, LDLCALC, TRIG, CHOLHDL, LDLDIRECT in the last 72 hours. Thyroid Function Tests: No results for input(s): TSH, T4TOTAL, FREET4, T3FREE, THYROIDAB in the last 72 hours. Anemia Panel: No results for input(s): VITAMINB12, FOLATE, FERRITIN, TIBC, IRON, RETICCTPCT in the last 72 hours. Sepsis Labs: Recent Labs  Lab 08/07/17 0955  LATICACIDVEN 1.31    Recent Results (from the past 240 hour(s))  Urine culture     Status: Abnormal   Collection Time: 08/07/17  9:49 AM  Result Value Ref Range Status   Specimen Description   Final    URINE, CLEAN CATCH Performed at [facility], [place] [address] [place], [place] [address]    Special Requests    Final    NONE Performed at [facility], [place] [address], [place], [place] [address]    Culture MULTIPLE SPECIES PRESENT, SUGGEST RECOLLECTION (A)  Final   Report Status 08/08/2017 FINAL  Final  Blood Culture (routine x 2)     Status: None (Preliminary result)   Collection Time: 08/07/17  9:57 AM  Result Value Ref Range Status   Specimen Description   Final    BLOOD RIGHT HAND Performed at [place] [address] [place], [place] [address]    Special Requests   Final    BOTTLES DRAWN AEROBIC AND ANAEROBIC Blood Culture adequate volume Performed at [place] [address], [place], [place] [address]    Culture   Final    NO GROWTH 2 DAYS Performed at [place] [address] [place], [place] [address]    Report Status PENDING  Incomplete  Blood Culture (routine x 2)     Status: None (Preliminary result)   Collection Time: 08/07/17 10:04 AM  Result Value Ref Range Status   Specimen Description   Final    BLOOD LEFT ANTECUBITAL Performed at [place] [address] [place], [place] [address]    Special Requests   Final    BOTTLES DRAWN AEROBIC AND ANAEROBIC Blood Culture adequate volume Performed at [place] [address] [place], [place] [address]    Culture   Final    NO GROWTH 2 DAYS Performed at [place] [address] [place], [place] [address]    Report Status PENDING  Incomplete         Radiology Studies: No results found.      Scheduled Meds: . amoxicillin-clavulanate  1 tablet Oral Q12H  . budesonide (PULMICORT) nebulizer solution  0.5 mg Nebulization BID  . dorzolamide-timolol  1 drop Both Eyes BID  . enoxaparin (LOVENOX) injection  40 mg Subcutaneous Q24H  . fluticasone  2 spray Each Nare Daily  . guaiFENesin  1,200 mg Oral BID  . ipratropium-albuterol  3 mL Nebulization TID  . loratadine  10 mg Oral Daily  .  multivitamin  1 tablet Oral BID  . oseltamivir  75 mg Oral BID  . pantoprazole  40 mg Oral Daily  . potassium chloride  40 mEq Oral Once  . predniSONE  60 mg Oral QAC breakfast  . propranolol  20 mg Oral Daily  . sertraline  100 mg Oral Daily   Continuous Infusions:   LOS: 1 day    Time spent: 35 minutes    Irine Seal, MD [facility] Pager [phone]  If 7PM-7AM, please contact night-coverage [email or web address] Password [facility] 08/09/2017, 12:49 PM

## 2017-08-10 LAB — BASIC METABOLIC PANEL
ANION GAP: 8 (ref 5–15)
BUN: 13 mg/dL (ref 6–20)
CALCIUM: 8.7 mg/dL — AB (ref 8.9–10.3)
CO2: 28 mmol/L (ref 22–32)
Chloride: 101 mmol/L (ref 101–111)
Creatinine, Ser: 0.48 mg/dL (ref 0.44–1.00)
GFR calc Af Amer: >60 mL/min (ref >60)
GLUCOSE: 124 mg/dL — AB (ref 65–99)
POTASSIUM: 3.9 mmol/L (ref 3.5–5.1)
Sodium: 137 mmol/L (ref 135–145)

## 2017-08-10 LAB — CBC
HCT: 38.6 % (ref 36.0–46.0)
Hemoglobin: 12.4 g/dL (ref 12.0–15.0)
MCH: 28.6 pg (ref 26.0–34.0)
MCHC: 32.1 g/dL (ref 30.0–36.0)
MCV: 88.9 fL (ref 78.0–100.0)
PLATELETS: 132 10*3/uL — AB (ref 150–400)
RBC: 4.34 MIL/uL (ref 3.87–5.11)
RDW: 15.7 % — AB (ref 11.5–15.5)
WBC: 3.8 10*3/uL — AB (ref 4.0–10.5)

## 2017-08-10 MED ORDER — AMOXICILLIN-POT CLAVULANATE 875-125 MG PO TABS: 1.0000 | ORAL_TABLET | Freq: Two times a day (BID) | ORAL | 0 refills | Status: AC

## 2017-08-10 MED ORDER — ALBUTEROL SULFATE HFA 108 (90 BASE) MCG/ACT IN AERS: 2.0000 | INHALATION_SPRAY | Freq: Every day | RESPIRATORY_TRACT | 0 refills | Status: DC | PRN

## 2017-08-10 MED ORDER — LORATADINE 10 MG PO TABS: 10.0000 mg | ORAL_TABLET | Freq: Every day | ORAL | 0 refills | Status: DC

## 2017-08-10 MED ORDER — GUAIFENESIN ER 600 MG PO TB12: 1200.0000 mg | ORAL_TABLET | Freq: Two times a day (BID) | ORAL | 0 refills | Status: AC

## 2017-08-10 MED ORDER — FLUTICASONE PROPIONATE 50 MCG/ACT NA SUSP: 2.0000 | Freq: Every day | NASAL | 0 refills | Status: DC

## 2017-08-10 MED ORDER — OSELTAMIVIR PHOSPHATE 75 MG PO CAPS: 75.0000 mg | ORAL_CAPSULE | Freq: Two times a day (BID) | ORAL | 0 refills | Status: AC

## 2017-08-10 MED ORDER — PREDNISONE 20 MG PO TABS: 20.0000 mg | ORAL_TABLET | Freq: Every day | ORAL | 0 refills | Status: DC

## 2017-08-10 NOTE — Progress Notes (Signed)
Pt IV removed. AVS reviewed at bedside w/ husband present, prescriptions provided to pt.  MD spoke w/ pts son on telephone w/ update. Pt dressed and safely d/c home.  Kizzie Ide, RN

## 2017-08-10 NOTE — Discharge Summary (Signed)
Physician Discharge Summary  JIL PENLAND EXH:[id number] DOB: 01-04-1941 DOA: 08/07/2017  PCP: [name], MD  Admit date: 08/07/2017 Discharge date: 08/10/2017  Time spent: 60 minutes  Recommendations for Outpatient Follow-up:  1. Follow-up with [name], MD in 1-2 weeks.  On follow-up patient's respiratory status will need to be reassessed.   Discharge Diagnoses:  Principal Problem:   Acute respiratory failure with hypoxia (HCC) Active Problems:   Influenza A   Anxiety   Depression   Hyperlipemia   Macular degeneration   Glaucoma   Weakness   Dehydration   Discharge Condition: Stable and improved  Diet recommendation: Regular  There were no vitals filed for this visit.  History of present illness:  Per Dr Pablo Lawrence is a 77 y.o. female with a history of bilateral knee OA s/p TKA's, HLD, HTN, 30 pack year tobacco use history, osteopenia who was brought by EMS to the ED for frequent falls and weakness. She began feeling malaise, generalized weakness 3 days ago with subjective fever and chills developing 2 days ago. She noticed some "crud in her lungs" and coughing and wheezing during this time as well. Symptoms became more severe which she felt were improved with albuterol inhaler at home. She usually ambulates without assistance but had needed to use a walker and even still has fallen 3 times in the past 24 hours, the third time she consented to transport to the ED. On arrival she had temperature 39F, tachypnea, with hypoxia during longer sentences and with ambulation. She felt improved after nebulizer treatments were given, but remained reluctant to go home given her amount of weakness and dyspnea on exertion. Hospitalists consulted for evaluation with flu swab pending.      Hospital Course:  1 acute respiratory failure with hypoxia secondary to influenza A/acute bronchitis Patient had presented with worsening shortness of breath on admission, subjective fever  and chills, coughing with wheezing and noted on arrival in the ED to be hypoxic during longer sentences and with ambulation.  Influenza PCR obtained was positive for influenza A.  Patient with sats of 88-89% on room air during ambulation.  Patient was placed empirically on Tamiflu, nebulizer treatments, Pulmicort, Claritin, Flonase, Augmentin as well as a steroid taper.  Patient improved clinically on a daily basis.  Patient will be discharged home on 2 more days of Tamiflu, 3 more days of oral Augmentin and albuterol MDIs.  Patient is to follow-up with PCP in the outpatient setting.  2.  Dehydration Patient hydrated with IV fluids and was euvolemic by day of discharge.   3.  Depression/anxiety Stable.  Continued on home regimen of propranolol and Zoloft.    4.  Macular Degeneration Patient was maintained on eyedrops.  5.  Weakness PT/OT. Patient will be sent home with home health therapies.     Procedures:  Chest x-ray 08/07/2017  CT head 08/07/2017  Chest x-ray 08/07/2017      Consultations:  None  Discharge Exam: Vitals:   08/10/17 0603 08/10/17 0851  BP: 134/63   Pulse: 66   Resp: 18   Temp: 98.7 F (37.1 C)   SpO2: 93% 94%    General: NAD Cardiovascular: RRR Respiratory: CTAB  Discharge Instructions   Discharge Instructions    Diet general   Complete by:  As directed    Increase activity slowly   Complete by:  As directed      Allergies as of 08/10/2017      Reactions   Morphine  And Related Itching   Cefadroxil Hives   Patient can take amoxicillin and cipro      Medication List    TAKE these medications   acetaminophen 500 MG tablet Commonly known as:  TYLENOL Take 1,000 mg by mouth daily as needed for headache.   albuterol 108 (90 Base) MCG/ACT inhaler Commonly known as:  PROVENTIL HFA;VENTOLIN HFA Inhale 2 puffs into the lungs daily as needed. For seasonal allergies . Use 2 puffs 3 times daily x 4 days then back to home regimen. What  changed:  additional instructions   ALPRAZolam 0.25 MG tablet Commonly known as:  XANAX Take 0.25 mg by mouth daily as needed for anxiety.   amoxicillin 500 MG capsule Commonly known as:  AMOXIL Take 2,000 mg by mouth See admin instructions. Before a dental visit   amoxicillin-clavulanate 875-125 MG tablet Commonly known as:  AUGMENTIN Take 1 tablet by mouth every 12 (twelve) hours for 3 days.   CALCIUM CITRATE + PO Take 1,000 mg by mouth daily.   cholecalciferol 1000 units tablet Commonly known as:  VITAMIN D Take 2,000 Units by mouth 2 (two) times daily.   dorzolamide 2 % ophthalmic solution Commonly known as:  TRUSOPT INSTILL 1 DROP INTO BOTH EYES DAILY   dorzolamide-timolol 22.3-6.8 MG/ML ophthalmic solution Commonly known as:  COSOPT Place 1 drop into both eyes 2 (two) times daily.   fluticasone 50 MCG/ACT nasal spray Commonly known as:  FLONASE Place 2 sprays into both nostrils daily. Start taking on:  08/11/2017   guaiFENesin 600 MG 12 hr tablet Commonly known as:  MUCINEX Take 2 tablets (1,200 mg total) by mouth 2 (two) times daily for 3 days.   HYDROcodone-acetaminophen 5-325 MG tablet Commonly known as:  NORCO/VICODIN Take 1-2 tablets by mouth every 6 (six) hours as needed for moderate pain. For pain   ICAPS AREDS 2 PO Take 1 capsule by mouth 2 (two) times daily.   loratadine 10 MG tablet Commonly known as:  CLARITIN Take 1 tablet (10 mg total) by mouth daily. Start taking on:  08/11/2017   multivitamin with minerals Tabs tablet Take 1 tablet by mouth daily.   omeprazole 20 MG capsule Commonly known as:  PRILOSEC Take 20 mg by mouth daily.   oseltamivir 75 MG capsule Commonly known as:  TAMIFLU Take 1 capsule (75 mg total) by mouth 2 (two) times daily for 2 days.   polyethylene glycol packet Commonly known as:  MIRALAX / GLYCOLAX Take 17 g by mouth daily.   predniSONE 20 MG tablet Commonly known as:  DELTASONE Take 1-2 tablets (20-40 mg  total) by mouth daily before breakfast. Take 2 tablets (40mg ) daily x 3 days, then 1 tablet (20mg ) daily x 3 days then stop. Start taking on:  08/11/2017   propranolol 20 MG tablet Commonly known as:  INDERAL Take 20 mg by mouth daily.   sertraline 100 MG tablet Commonly known as:  ZOLOFT Take 100 mg by mouth daily.   simvastatin 20 MG tablet Commonly known as:  ZOCOR Take 20 mg by mouth daily.   vitamin C 500 MG tablet Commonly known as:  ASCORBIC ACID Take 1,000 mg by mouth daily.      Allergies  Allergen Reactions  . Morphine And Related Itching  . Cefadroxil Hives    Patient can take amoxicillin and cipro   Follow-up Information    [facility] Follow up.   Specialty:  Home Health Services Why:  [place]  Therapy, Occupational Therapy, Social Worker, aide- agency will call to arrange initial visit Contact information: [address] [place] [address] [phone]        [name], MD. Schedule an appointment as soon as possible for a visit in 2 week(s).   Specialty:  Internal Medicine Why:  f/u in 1-2 weeks. Contact information: [address] [place] [address] [phone]            The results of significant diagnostics from this hospitalization (including imaging, microbiology, ancillary and laboratory) are listed below for reference.    Significant Diagnostic Studies: Dg Chest 2 View  Result Date: 08/07/2017 CLINICAL DATA:  Cough. EXAM: CHEST  2 VIEW COMPARISON:  Radiographs of December 11, 2011. FINDINGS: The heart size and mediastinal contours are within normal limits. Both lungs are clear. Atherosclerosis of thoracic aorta is noted. No pneumothorax or pleural effusion is noted. The visualized skeletal structures are unremarkable. IMPRESSION: No active cardiopulmonary disease.  Aortic atherosclerosis. Electronically Signed   By: Marijo Conception, M.D.   On: 08/07/2017 10:46   Ct Head Wo Contrast  Result  Date: 08/07/2017 CLINICAL DATA:  Several recent falls.  Difficulty with balance. EXAM: CT HEAD WITHOUT CONTRAST TECHNIQUE: Contiguous axial images were obtained from the base of the skull through the vertex without intravenous contrast. COMPARISON:  None. FINDINGS: Brain: There is mild diffuse atrophy. There is slight invagination of CSF into the sella. There is no intracranial mass, hemorrhage, extra-axial fluid collection, or midline shift. There is slight small vessel disease in the centra semiovale bilaterally. Elsewhere gray-white compartments appear normal. No acute infarct evident. Vascular: No appreciable hyperdense vessel. There is calcification in each carotid siphon. Skull: The bony calvarium appears intact. Sinuses/Orbits: There is opacification in several ethmoid air cells bilaterally. Other visualized paranasal sinuses are clear. Orbits appear symmetric bilaterally. Other: Mastoid air cells are clear. IMPRESSION: Mild atrophy with slight periventricular small vessel disease. No mass, hemorrhage, or evidence of acute infarct. There are foci of arterial vascular calcification. Areas of ethmoid sinus disease noted. Electronically Signed   By: Lowella Grip III M.D.   On: 08/07/2017 10:57    Microbiology: Recent Results (from the past 240 hour(s))  Urine culture     Status: Abnormal   Collection Time: 08/07/17  9:49 AM  Result Value Ref Range Status   Specimen Description   Final    URINE, CLEAN CATCH Performed at [facility], [place] [address] [place], [place] [address]    Special Requests   Final    NONE Performed at [facility], [place] [address], [place], [place] [address]    Culture MULTIPLE SPECIES PRESENT, SUGGEST RECOLLECTION (A)  Final   Report Status 08/08/2017 FINAL  Final  Blood Culture (routine x 2)     Status: None (Preliminary result)   Collection Time: 08/07/17  9:57 AM  Result Value Ref Range Status   Specimen Description   Final     BLOOD RIGHT HAND Performed at [place] [address] [place], [place] [address]    Special Requests   Final    BOTTLES DRAWN AEROBIC AND ANAEROBIC Blood Culture adequate volume Performed at [place] [address] [place], [place] [address]    Culture   Final    NO GROWTH 3 DAYS Performed at [facility], [place] [address] [place], [place] [address]    Report Status PENDING  Incomplete  Blood Culture (routine  x 2)     Status: None (Preliminary result)   Collection Time: 08/07/17 10:04 AM  Result Value Ref Range Status   Specimen Description   Final    BLOOD LEFT ANTECUBITAL Performed at [place] [address], [place], [place] [address]    Special Requests   Final    BOTTLES DRAWN AEROBIC AND ANAEROBIC Blood Culture adequate volume Performed at [place] [address], [place], [place] [address]    Culture   Final    NO GROWTH 3 DAYS Performed at [facility], [place] [address], [place], [place] [address]    Report Status PENDING  Incomplete     Labs: Basic Metabolic Panel: Recent Labs  Lab 08/07/17 0949 08/08/17 0629 08/09/17 0616 08/10/17 0540  NA 136 134* 140 137  K 3.2* 3.1* 4.6 3.9  CL 100* 100* 106 101  CO2 26 25 29 28   GLUCOSE 108* 126* 123* 124*  BUN 11 10 9 13   CREATININE 0.59 0.47 0.48 0.48  CALCIUM 8.7* 8.4* 8.8* 8.7*  MG  --  2.0 2.2  --    Liver Function Tests: Recent Labs  Lab 08/07/17 0949  AST 59*  ALT 20  ALKPHOS 56  BILITOT 0.7  PROT 6.5  ALBUMIN 3.6   No results for input(s): LIPASE, AMYLASE in the last 168 hours. No results for input(s): AMMONIA in the last 168 hours. CBC: Recent Labs  Lab 08/07/17 0949 08/09/17 0616 08/10/17 0540  WBC 5.9 2.5* 3.8*  NEUTROABS 4.0  --   --   HGB 13.0 12.1 12.4  HCT 40.3 37.9 38.6  MCV 88.8 89.4 88.9  PLT 128* 128* 132*   Cardiac Enzymes: No results for input(s): CKTOTAL, CKMB,  CKMBINDEX, TROPONINI in the last 168 hours. BNP: BNP (last 3 results) No results for input(s): BNP in the last 8760 hours.  ProBNP (last 3 results) No results for input(s): PROBNP in the last 8760 hours.  CBG: No results for input(s): GLUCAP in the last 168 hours.     Signed:  Irine Seal MD.  [facility] 08/10/2017, 1:57 PM

## 2017-08-10 NOTE — Care Management Note (Signed)
Case Management Note  Patient Details  Name: [name] MRN: [id number] Date of Birth: 1941-06-12  Subjective/Objective:   Acute respiratory failure, Influenza A                 Action/Plan: Spoke to pt and offered choice for HH/list provided. Pt agreeable to [facility] in the past. She had [facility] in the past but they do not accept her insurance. Contacted [facility] with new referral.   Expected Discharge Date:  08/10/17               Expected Discharge Plan:  [place]  In-House Referral:  NA  Discharge planning Services  CM Consult  Post Acute Care Choice:  Home Health Choice offered to:  Patient  DME Arranged:  N/A DME Agency:  NA  HH Arranged:  PT, OT, Nurse's Aide, Social Work [employer] Agency:  [place]  Status of Service:  Completed, signed off  If discussed at [employer], dates discussed:    Additional Comments:  Erenest Rasher, RN 08/10/2017, 2:30 PM

## 2017-08-10 NOTE — Progress Notes (Signed)
PT demonstrated hands on understanding of Flutter device. 

## 2017-08-12 DIAGNOSIS — Z8719 Personal history of other diseases of the digestive system: Secondary | ICD-10-CM | POA: Diagnosis not present

## 2017-08-12 DIAGNOSIS — D649 Anemia, unspecified: Secondary | ICD-10-CM | POA: Diagnosis not present

## 2017-08-12 DIAGNOSIS — R2689 Other abnormalities of gait and mobility: Secondary | ICD-10-CM | POA: Diagnosis not present

## 2017-08-12 DIAGNOSIS — J209 Acute bronchitis, unspecified: Secondary | ICD-10-CM | POA: Diagnosis not present

## 2017-08-12 DIAGNOSIS — H409 Unspecified glaucoma: Secondary | ICD-10-CM | POA: Diagnosis not present

## 2017-08-12 DIAGNOSIS — Z9181 History of falling: Secondary | ICD-10-CM | POA: Diagnosis not present

## 2017-08-12 DIAGNOSIS — J9601 Acute respiratory failure with hypoxia: Secondary | ICD-10-CM | POA: Diagnosis not present

## 2017-08-12 DIAGNOSIS — J09X2 Influenza due to identified novel influenza A virus with other respiratory manifestations: Secondary | ICD-10-CM | POA: Diagnosis not present

## 2017-08-12 DIAGNOSIS — R221 Localized swelling, mass and lump, neck: Secondary | ICD-10-CM | POA: Diagnosis not present

## 2017-08-12 DIAGNOSIS — M1710 Unilateral primary osteoarthritis, unspecified knee: Secondary | ICD-10-CM | POA: Diagnosis not present

## 2017-08-12 DIAGNOSIS — R69 Illness, unspecified: Secondary | ICD-10-CM | POA: Diagnosis not present

## 2017-08-12 DIAGNOSIS — J189 Pneumonia, unspecified organism: Secondary | ICD-10-CM | POA: Diagnosis not present

## 2017-08-12 DIAGNOSIS — R5381 Other malaise: Secondary | ICD-10-CM | POA: Diagnosis not present

## 2017-08-12 DIAGNOSIS — E785 Hyperlipidemia, unspecified: Secondary | ICD-10-CM | POA: Diagnosis not present

## 2017-08-12 DIAGNOSIS — R41841 Cognitive communication deficit: Secondary | ICD-10-CM | POA: Diagnosis not present

## 2017-08-12 DIAGNOSIS — I10 Essential (primary) hypertension: Secondary | ICD-10-CM | POA: Diagnosis not present

## 2017-08-12 LAB — CULTURE, BLOOD (ROUTINE X 2)
CULTURE: NO GROWTH
Culture: NO GROWTH
SPECIAL REQUESTS: ADEQUATE
Special Requests: ADEQUATE

## 2017-08-14 DIAGNOSIS — J111 Influenza due to unidentified influenza virus with other respiratory manifestations: Secondary | ICD-10-CM | POA: Diagnosis not present

## 2017-08-14 DIAGNOSIS — R0901 Asphyxia: Secondary | ICD-10-CM | POA: Diagnosis not present

## 2017-08-14 DIAGNOSIS — J45998 Other asthma: Secondary | ICD-10-CM | POA: Diagnosis not present

## 2017-08-14 DIAGNOSIS — Z6833 Body mass index (BMI) 33.0-33.9, adult: Secondary | ICD-10-CM | POA: Diagnosis not present

## 2017-08-14 DIAGNOSIS — J209 Acute bronchitis, unspecified: Secondary | ICD-10-CM | POA: Diagnosis not present

## 2017-08-14 DIAGNOSIS — R0609 Other forms of dyspnea: Secondary | ICD-10-CM | POA: Diagnosis not present

## 2017-08-15 DIAGNOSIS — H409 Unspecified glaucoma: Secondary | ICD-10-CM | POA: Diagnosis not present

## 2017-08-15 DIAGNOSIS — R5381 Other malaise: Secondary | ICD-10-CM | POA: Diagnosis not present

## 2017-08-15 DIAGNOSIS — R69 Illness, unspecified: Secondary | ICD-10-CM | POA: Diagnosis not present

## 2017-08-15 DIAGNOSIS — I10 Essential (primary) hypertension: Secondary | ICD-10-CM | POA: Diagnosis not present

## 2017-08-15 DIAGNOSIS — E785 Hyperlipidemia, unspecified: Secondary | ICD-10-CM | POA: Diagnosis not present

## 2017-08-15 DIAGNOSIS — J09X2 Influenza due to identified novel influenza A virus with other respiratory manifestations: Secondary | ICD-10-CM | POA: Diagnosis not present

## 2017-08-15 DIAGNOSIS — Z8719 Personal history of other diseases of the digestive system: Secondary | ICD-10-CM | POA: Diagnosis not present

## 2017-08-15 DIAGNOSIS — J209 Acute bronchitis, unspecified: Secondary | ICD-10-CM | POA: Diagnosis not present

## 2017-08-15 DIAGNOSIS — J9601 Acute respiratory failure with hypoxia: Secondary | ICD-10-CM | POA: Diagnosis not present

## 2017-08-15 DIAGNOSIS — Z9181 History of falling: Secondary | ICD-10-CM | POA: Diagnosis not present

## 2017-08-18 DIAGNOSIS — Z961 Presence of intraocular lens: Secondary | ICD-10-CM | POA: Diagnosis not present

## 2017-08-18 DIAGNOSIS — H401122 Primary open-angle glaucoma, left eye, moderate stage: Secondary | ICD-10-CM | POA: Diagnosis not present

## 2017-08-18 DIAGNOSIS — H401111 Primary open-angle glaucoma, right eye, mild stage: Secondary | ICD-10-CM | POA: Diagnosis not present

## 2017-08-21 DIAGNOSIS — J9601 Acute respiratory failure with hypoxia: Secondary | ICD-10-CM | POA: Diagnosis not present

## 2017-08-21 DIAGNOSIS — Z9181 History of falling: Secondary | ICD-10-CM | POA: Diagnosis not present

## 2017-08-21 DIAGNOSIS — E785 Hyperlipidemia, unspecified: Secondary | ICD-10-CM | POA: Diagnosis not present

## 2017-08-21 DIAGNOSIS — R5381 Other malaise: Secondary | ICD-10-CM | POA: Diagnosis not present

## 2017-08-21 DIAGNOSIS — J209 Acute bronchitis, unspecified: Secondary | ICD-10-CM | POA: Diagnosis not present

## 2017-08-21 DIAGNOSIS — H409 Unspecified glaucoma: Secondary | ICD-10-CM | POA: Diagnosis not present

## 2017-08-21 DIAGNOSIS — J09X2 Influenza due to identified novel influenza A virus with other respiratory manifestations: Secondary | ICD-10-CM | POA: Diagnosis not present

## 2017-08-21 DIAGNOSIS — R69 Illness, unspecified: Secondary | ICD-10-CM | POA: Diagnosis not present

## 2017-08-21 DIAGNOSIS — I10 Essential (primary) hypertension: Secondary | ICD-10-CM | POA: Diagnosis not present

## 2017-08-21 DIAGNOSIS — Z8719 Personal history of other diseases of the digestive system: Secondary | ICD-10-CM | POA: Diagnosis not present

## 2017-09-02 ENCOUNTER — Emergency Department (HOSPITAL_COMMUNITY): Payer: Medicare HMO

## 2017-09-02 ENCOUNTER — Other Ambulatory Visit: Payer: Self-pay

## 2017-09-02 ENCOUNTER — Encounter (HOSPITAL_COMMUNITY): Payer: Self-pay | Admitting: Emergency Medicine

## 2017-09-02 DIAGNOSIS — Y9301 Activity, walking, marching and hiking: Secondary | ICD-10-CM

## 2017-09-02 DIAGNOSIS — Y9222 Religious institution as the place of occurrence of the external cause: Secondary | ICD-10-CM | POA: Insufficient documentation

## 2017-09-02 DIAGNOSIS — S161XXA Strain of muscle, fascia and tendon at neck level, initial encounter: Secondary | ICD-10-CM

## 2017-09-02 DIAGNOSIS — S0990XA Unspecified injury of head, initial encounter: Secondary | ICD-10-CM

## 2017-09-02 DIAGNOSIS — W1830XA Fall on same level, unspecified, initial encounter: Secondary | ICD-10-CM

## 2017-09-02 DIAGNOSIS — S0003XA Contusion of scalp, initial encounter: Secondary | ICD-10-CM | POA: Diagnosis not present

## 2017-09-02 DIAGNOSIS — Y999 Unspecified external cause status: Secondary | ICD-10-CM | POA: Insufficient documentation

## 2017-09-02 DIAGNOSIS — S199XXA Unspecified injury of neck, initial encounter: Secondary | ICD-10-CM | POA: Diagnosis not present

## 2017-09-02 NOTE — ED Triage Notes (Signed)
Pt states she was at a church [employer] and tripped over the leg of a chair and fell striking the front of her head on the concrete floor  Denies LOC  Pt does not take blood thinners or aspirin  Pt has a hematoma noted on her forehead

## 2017-09-02 NOTE — ED Triage Notes (Signed)
Ice pack given in triage.

## 2017-09-03 ENCOUNTER — Emergency Department (HOSPITAL_COMMUNITY)
Admission: EM | Admit: 2017-09-03 | Discharge: 2017-09-03 | Disposition: A | Discharge status: Home / Self Care | Payer: Medicare HMO | Source: Home / Self Care | Attending: Emergency Medicine | Admitting: Emergency Medicine

## 2017-09-03 DIAGNOSIS — S0990XA Unspecified injury of head, initial encounter: Secondary | ICD-10-CM

## 2017-09-03 DIAGNOSIS — S161XXA Strain of muscle, fascia and tendon at neck level, initial encounter: Secondary | ICD-10-CM

## 2017-09-03 NOTE — ED Provider Notes (Signed)
[facility] DEPT Provider Note   CSN: [id number] Arrival date & time: 09/02/17  2002     History   Chief Complaint Chief Complaint  Patient presents with  . Fall  . Head Injury    HPI [name] is a 77 y.o. female.  Patient is a 77 year old female presenting with complaints of fall.  She was walking at church this evening when she tripped, fell forward, and struck her forehead on the floor.  She developed an area of swelling but did not lose consciousness.  She has mild headache and mild stiff neck which she attributes to arthritis.  She denies any numbness or tingling.   The history is provided by the patient.  Fall  This is a new problem. Episode onset: Earlier this evening. The problem occurs constantly. The problem has not changed since onset.Associated symptoms include headaches. Nothing aggravates the symptoms. Nothing relieves the symptoms. She has tried nothing for the symptoms.    Past Medical History:  Diagnosis Date  . Anxiety   . Arthritis    "bilateral knees, shoulders, elbows; pretty widespread"  . BPPV (benign paroxysmal positional vertigo)   . Cancer ([place])    H/O basal skin cancer 2001  . Depression   . GERD (gastroesophageal reflux disease)   . Glaucoma   . History of blood transfusion ~ 2008   "right after but not related to left knee replacement"  . Hyperlipemia   . Lower GI bleeding ~ 2008   "had to have blood transfusion"  . Macular degeneration   . Osteopenia   . Seasonal allergies   . Sleeping excessive   . Tinnitus of right ear     Patient Active Problem List   Diagnosis Date Noted  . Influenza A 08/08/2017  . Acute respiratory failure with hypoxia ([place]) 08/07/2017  . Depression 08/07/2017  . Hyperlipemia 08/07/2017  . Macular degeneration 08/07/2017  . Glaucoma 08/07/2017  . Weakness 08/07/2017  . Dehydration 08/07/2017  . Lower GI bleeding 10/14/2016  . Diverticulosis of colon 10/14/2016  .  Anxiety 10/14/2016  . Lower GI bleed 10/14/2016  . Acute GI bleeding   . Osteoarthrosis, unspecified whether generalized or localized, lower leg 12/20/2011    Past Surgical History:  Procedure Laterality Date  . CATARACT EXTRACTION W/ INTRAOCULAR LENS  IMPLANT, BILATERAL  1990's  . JOINT REPLACEMENT     lft knee  . TONSILLECTOMY AND ADENOIDECTOMY  1946  . TOTAL KNEE ARTHROPLASTY  ~ 2008; 12/20/11   left; right  . TOTAL KNEE ARTHROPLASTY  12/20/2011   Procedure: TOTAL KNEE ARTHROPLASTY;  Surgeon: Augustin Schooling, MD;  Location: [facility];  Service: Orthopedics;  Laterality: Right;  Right Total Knee Arthroplasty  . TUBAL LIGATION  1980's    OB History    No data available       Home Medications    Prior to Admission medications   Medication Sig Start Date End Date Taking? Authorizing Provider  acetaminophen (TYLENOL) 500 MG tablet Take 1,000 mg by mouth daily as needed for headache.    [provider]  albuterol (PROVENTIL HFA;VENTOLIN HFA) 108 (90 Base) MCG/ACT inhaler Inhale 2 puffs into the lungs daily as needed. For seasonal allergies . Use 2 puffs 3 times daily x 4 days then back to home regimen. 08/10/17   Eugenie Filler, MD  ALPRAZolam Duanne Moron) 0.25 MG tablet Take 0.25 mg by mouth daily as needed for anxiety.     [provider]  amoxicillin (AMOXIL) 500 MG capsule Take 2,000 mg by mouth See admin instructions. Before a dental visit    [provider]  Calcium Citrate-Vitamin D (CALCIUM CITRATE + PO) Take 1,000 mg by mouth daily.    [provider]  cholecalciferol (VITAMIN D) 1000 UNITS tablet Take 2,000 Units by mouth 2 (two) times daily.     [provider]  dorzolamide (TRUSOPT) 2 % ophthalmic solution INSTILL 1 DROP INTO BOTH EYES DAILY 01/16/15   [provider]  dorzolamide-timolol (COSOPT) 22.3-6.8 MG/ML ophthalmic solution Place 1 drop into both eyes 2 (two) times daily.    [provider]  fluticasone  (FLONASE) 50 MCG/ACT nasal spray Place 2 sprays into both nostrils daily. 08/11/17   Eugenie Filler, MD  HYDROcodone-acetaminophen (NORCO) 5-325 MG per tablet Take 1-2 tablets by mouth every 6 (six) hours as needed for moderate pain. For pain    [provider]  loratadine (CLARITIN) 10 MG tablet Take 1 tablet (10 mg total) by mouth daily. 08/11/17   Eugenie Filler, MD  Multiple Vitamin (MULTIVITAMIN WITH MINERALS) TABS Take 1 tablet by mouth daily.    [provider]  Multiple Vitamins-Minerals (ICAPS AREDS 2 PO) Take 1 capsule by mouth 2 (two) times daily.    [provider]  omeprazole (PRILOSEC) 20 MG capsule Take 20 mg by mouth daily.     [provider]  polyethylene glycol (MIRALAX / GLYCOLAX) packet Take 17 g by mouth daily.    [provider]  predniSONE (DELTASONE) 20 MG tablet Take 1-2 tablets (20-40 mg total) by mouth daily before breakfast. Take 2 tablets (40mg ) daily x 3 days, then 1 tablet (20mg ) daily x 3 days then stop. 08/11/17   Eugenie Filler, MD  propranolol (INDERAL) 20 MG tablet Take 20 mg by mouth daily.    [provider]  sertraline (ZOLOFT) 100 MG tablet Take 100 mg by mouth daily.    [provider]  simvastatin (ZOCOR) 20 MG tablet Take 20 mg by mouth daily.    [provider]  vitamin C (ASCORBIC ACID) 500 MG tablet Take 1,000 mg by mouth daily.     [provider]    Family History Family History  Problem Relation Age of Onset  . Heart attack Mother   . Heart disease Father   . Heart failure Father   . Bladder Cancer Father   . Supraventricular tachycardia Sister   . Hypertension Sister     Social History Social History   Tobacco Use  . Smoking status: Former Smoker    Packs/day: 1.00    Years: 20.00    Pack years: 20.00    Last attempt to quit: 07/02/1995    Years since quitting: 22.1  . Smokeless tobacco: Never Used  . Tobacco comment: 12/23/11 "quit smoking ~ 15  years ago; smoked about 20 years"  Substance Use Topics  . Alcohol use: Yes    Alcohol/week: 3.0 oz    Types: 5 Glasses of wine per week    Comment: 12/23/11 "6 oz wine, 5 days/wk"  . Drug use: No     Allergies   Morphine and related and Cefadroxil   Review of Systems Review of Systems  Neurological: Positive for headaches.  All other systems reviewed and are negative.    Physical Exam Updated Vital Signs BP 131/70 (BP Location: Left Arm)   Pulse 91   Temp 98.1 F (36.7 C) (Oral)   Resp  18   SpO2 96%   Physical Exam  Constitutional: She is oriented to person, place, and time. She appears well-developed and well-nourished. No distress.  HENT:  Head: Normocephalic.  There is a contusion to the left mid forehead.  Eyes: EOM are normal. Pupils are equal, round, and reactive to light.  Neck: Normal range of motion. Neck supple.  Cardiovascular: Normal rate and regular rhythm. Exam reveals no gallop and no friction rub.  No murmur heard. Pulmonary/Chest: Effort normal and breath sounds normal. No respiratory distress. She has no wheezes.  Abdominal: Soft. Bowel sounds are normal. She exhibits no distension. There is no tenderness.  Musculoskeletal: Normal range of motion.  Neurological: She is alert and oriented to person, place, and time. No cranial nerve deficit. She exhibits normal muscle tone. Coordination normal.  Skin: Skin is warm and dry. She is not diaphoretic.  Nursing note and vitals reviewed.    ED Treatments / Results  Labs (all labs ordered are listed, but only abnormal results are displayed) Labs Reviewed - No data to display  EKG  EKG Interpretation None       Radiology Ct Head Wo Contrast  Result Date: 09/02/2017 CLINICAL DATA:  Patient fell onto concrete at 1900 hours this evening. Patient struck the front of her head on the concrete floor without loss of consciousness. EXAM: CT HEAD WITHOUT CONTRAST CT CERVICAL SPINE WITHOUT CONTRAST  TECHNIQUE: Multidetector CT imaging of the head and cervical spine was performed following the standard protocol without intravenous contrast. Multiplanar CT image reconstructions of the cervical spine were also generated. COMPARISON:  08/07/2017 FINDINGS: CT HEAD FINDINGS Brain: Mild superficial atrophy. No acute intracranial hemorrhage, midline shift or edema. No large vascular territory infarct. Minimal chronic small vessel ischemic disease of periventricular white matter. No intra-axial mass nor extra-axial fluid collections. Midline fourth ventricle and basal cisterns. Vascular: No hyperdense vessel sign. Atherosclerotic calcifications of the carotid siphons. Skull: No acute skull fracture.  No suspicious osseous lesions. Sinuses/Orbits: No acute finding. Other: Left frontal scalp hematoma. CT CERVICAL SPINE FINDINGS Alignment: Reversal of cervical lordosis. Osteoarthritis of the atlantodental interval. Intact craniocervical relationship. Skull base and vertebrae: No acute cervical spine fracture. No jumped or perched facets. Intact skull base. Soft tissues and spinal canal: No prevertebral soft tissue swelling. No visible canal hematoma. Disc levels: Mild disc space narrowing C2 through C4 and C5-6 with marked disc space narrowing at C4-5, C6-7 and C7-T1. Degenerative facet arthropathy is noted bilaterally at C2-3 and C3-4 on the left at C5-6 on the right. Associated uncovertebral joint osteoarthritis with uncinate spurring is seen bilaterally at C3-4, C4-5 and C6-7. Upper chest: Negative. Other: None IMPRESSION: 1. Chronic mild superficial atrophy and small vessel ischemic disease. No acute intracranial abnormality. 2. Mild left frontal scalp contusion.  No underlying skull fracture. 3. 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 Electronically Signed   By: Ashley Royalty M.D.   On: 09/02/2017 23:54   Ct Cervical Spine Wo Contrast  Result Date: 09/02/2017 CLINICAL DATA:  Patient fell onto  concrete at 1900 hours this evening. Patient struck the front of her head on the concrete floor without loss of consciousness. EXAM: CT HEAD WITHOUT CONTRAST CT CERVICAL SPINE WITHOUT CONTRAST TECHNIQUE: Multidetector CT imaging of the head and cervical spine was performed following the standard protocol without intravenous contrast. Multiplanar CT image reconstructions of the cervical spine were also generated. COMPARISON:  08/07/2017 FINDINGS: CT HEAD FINDINGS Brain: Mild superficial atrophy. No acute intracranial hemorrhage, midline shift  or edema. No large vascular territory infarct. Minimal chronic small vessel ischemic disease of periventricular white matter. No intra-axial mass nor extra-axial fluid collections. Midline fourth ventricle and basal cisterns. Vascular: No hyperdense vessel sign. Atherosclerotic calcifications of the carotid siphons. Skull: No acute skull fracture.  No suspicious osseous lesions. Sinuses/Orbits: No acute finding. Other: Left frontal scalp hematoma. CT CERVICAL SPINE FINDINGS Alignment: Reversal of cervical lordosis. Osteoarthritis of the atlantodental interval. Intact craniocervical relationship. Skull base and vertebrae: No acute cervical spine fracture. No jumped or perched facets. Intact skull base. Soft tissues and spinal canal: No prevertebral soft tissue swelling. No visible canal hematoma. Disc levels: Mild disc space narrowing C2 through C4 and C5-6 with marked disc space narrowing at C4-5, C6-7 and C7-T1. Degenerative facet arthropathy is noted bilaterally at C2-3 and C3-4 on the left at C5-6 on the right. Associated uncovertebral joint osteoarthritis with uncinate spurring is seen bilaterally at C3-4, C4-5 and C6-7. Upper chest: Negative. Other: None IMPRESSION: 1. Chronic mild superficial atrophy and small vessel ischemic disease. No acute intracranial abnormality. 2. Mild left frontal scalp contusion.  No underlying skull fracture. 3. Cervical spondylosis without  acute cervical spine fracture. Electronically Signed   By: Ashley Royalty M.D.   On: 09/02/2017 23:54    Procedures Procedures (including critical care time)  Medications Ordered in ED Medications - No data to display   Initial Impression / Assessment and Plan / ED Course  I have reviewed the triage vital signs and the nursing notes.  Pertinent labs & imaging results that were available during my care of the patient were reviewed by me and considered in my medical decision making (see chart for details).  Patient is neurologically intact and CT scan of the head is negative.  CT scan of the cervical spine is negative as well.  She will be discharged with rotating Tylenol and ibuprofen and follow-up as needed.  Final Clinical Impressions(s) / ED Diagnoses   Final diagnoses:  Closed head injury, initial encounter  Strain of neck muscle, initial encounter    ED Discharge Orders    None       Veryl Speak, MD 09/03/17 [phone]

## 2017-09-03 NOTE — Discharge Instructions (Signed)
Ibuprofen 400 mg rotated with Tylenol 650 mg every 4 hours as needed for pain.  Return to the emergency department if you experience any new or concerning symptoms.

## 2017-09-05 ENCOUNTER — Encounter (HOSPITAL_COMMUNITY): Payer: Self-pay | Admitting: General Practice

## 2017-09-05 ENCOUNTER — Other Ambulatory Visit: Payer: Self-pay

## 2017-09-05 ENCOUNTER — Inpatient Hospital Stay (HOSPITAL_COMMUNITY)
Admission: EM | Admit: 2017-09-05 | Discharge: 2017-09-14 | DRG: 871 | Disposition: A | Discharge status: Skilled Nursing Facility | Payer: Medicare HMO | Attending: Internal Medicine | Admitting: Internal Medicine

## 2017-09-05 DIAGNOSIS — K219 Gastro-esophageal reflux disease without esophagitis: Secondary | ICD-10-CM | POA: Diagnosis present

## 2017-09-05 DIAGNOSIS — K5791 Diverticulosis of intestine, part unspecified, without perforation or abscess with bleeding: Secondary | ICD-10-CM | POA: Diagnosis present

## 2017-09-05 DIAGNOSIS — Z96653 Presence of artificial knee joint, bilateral: Secondary | ICD-10-CM | POA: Diagnosis present

## 2017-09-05 DIAGNOSIS — R062 Wheezing: Secondary | ICD-10-CM

## 2017-09-05 DIAGNOSIS — R498 Other voice and resonance disorders: Secondary | ICD-10-CM | POA: Diagnosis not present

## 2017-09-05 DIAGNOSIS — K5731 Diverticulosis of large intestine without perforation or abscess with bleeding: Secondary | ICD-10-CM | POA: Diagnosis not present

## 2017-09-05 DIAGNOSIS — R509 Fever, unspecified: Secondary | ICD-10-CM

## 2017-09-05 DIAGNOSIS — M6281 Muscle weakness (generalized): Secondary | ICD-10-CM | POA: Diagnosis not present

## 2017-09-05 DIAGNOSIS — H8110 Benign paroxysmal vertigo, unspecified ear: Secondary | ICD-10-CM | POA: Diagnosis present

## 2017-09-05 DIAGNOSIS — D649 Anemia, unspecified: Secondary | ICD-10-CM

## 2017-09-05 DIAGNOSIS — F419 Anxiety disorder, unspecified: Secondary | ICD-10-CM | POA: Diagnosis present

## 2017-09-05 DIAGNOSIS — R4182 Altered mental status, unspecified: Secondary | ICD-10-CM

## 2017-09-05 DIAGNOSIS — R2689 Other abnormalities of gait and mobility: Secondary | ICD-10-CM | POA: Diagnosis not present

## 2017-09-05 DIAGNOSIS — J9601 Acute respiratory failure with hypoxia: Secondary | ICD-10-CM | POA: Diagnosis present

## 2017-09-05 DIAGNOSIS — M8580 Other specified disorders of bone density and structure, unspecified site: Secondary | ICD-10-CM | POA: Diagnosis present

## 2017-09-05 DIAGNOSIS — A419 Sepsis, unspecified organism: Principal | ICD-10-CM | POA: Diagnosis present

## 2017-09-05 DIAGNOSIS — Z888 Allergy status to other drugs, medicaments and biological substances status: Secondary | ICD-10-CM

## 2017-09-05 DIAGNOSIS — E872 Acidosis: Secondary | ICD-10-CM | POA: Diagnosis present

## 2017-09-05 DIAGNOSIS — K922 Gastrointestinal hemorrhage, unspecified: Secondary | ICD-10-CM | POA: Diagnosis not present

## 2017-09-05 DIAGNOSIS — J189 Pneumonia, unspecified organism: Secondary | ICD-10-CM

## 2017-09-05 DIAGNOSIS — E785 Hyperlipidemia, unspecified: Secondary | ICD-10-CM | POA: Diagnosis present

## 2017-09-05 DIAGNOSIS — K5790 Diverticulosis of intestine, part unspecified, without perforation or abscess without bleeding: Secondary | ICD-10-CM | POA: Diagnosis not present

## 2017-09-05 DIAGNOSIS — Z9842 Cataract extraction status, left eye: Secondary | ICD-10-CM

## 2017-09-05 DIAGNOSIS — I10 Essential (primary) hypertension: Secondary | ICD-10-CM | POA: Diagnosis present

## 2017-09-05 DIAGNOSIS — R0600 Dyspnea, unspecified: Secondary | ICD-10-CM

## 2017-09-05 DIAGNOSIS — R0989 Other specified symptoms and signs involving the circulatory and respiratory systems: Secondary | ICD-10-CM

## 2017-09-05 DIAGNOSIS — Z8249 Family history of ischemic heart disease and other diseases of the circulatory system: Secondary | ICD-10-CM

## 2017-09-05 DIAGNOSIS — R0609 Other forms of dyspnea: Secondary | ICD-10-CM | POA: Diagnosis not present

## 2017-09-05 DIAGNOSIS — D500 Iron deficiency anemia secondary to blood loss (chronic): Secondary | ICD-10-CM | POA: Diagnosis not present

## 2017-09-05 DIAGNOSIS — K625 Hemorrhage of anus and rectum: Secondary | ICD-10-CM | POA: Diagnosis not present

## 2017-09-05 DIAGNOSIS — E876 Hypokalemia: Secondary | ICD-10-CM | POA: Diagnosis not present

## 2017-09-05 DIAGNOSIS — H3530 Unspecified macular degeneration: Secondary | ICD-10-CM | POA: Diagnosis present

## 2017-09-05 DIAGNOSIS — Z961 Presence of intraocular lens: Secondary | ICD-10-CM | POA: Diagnosis present

## 2017-09-05 DIAGNOSIS — R918 Other nonspecific abnormal finding of lung field: Secondary | ICD-10-CM | POA: Diagnosis not present

## 2017-09-05 DIAGNOSIS — G9341 Metabolic encephalopathy: Secondary | ICD-10-CM | POA: Diagnosis not present

## 2017-09-05 DIAGNOSIS — J9602 Acute respiratory failure with hypercapnia: Secondary | ICD-10-CM | POA: Diagnosis not present

## 2017-09-05 DIAGNOSIS — J181 Lobar pneumonia, unspecified organism: Secondary | ICD-10-CM | POA: Diagnosis present

## 2017-09-05 DIAGNOSIS — K921 Melena: Secondary | ICD-10-CM | POA: Diagnosis not present

## 2017-09-05 DIAGNOSIS — Z85828 Personal history of other malignant neoplasm of skin: Secondary | ICD-10-CM | POA: Diagnosis not present

## 2017-09-05 DIAGNOSIS — R531 Weakness: Secondary | ICD-10-CM | POA: Diagnosis not present

## 2017-09-05 DIAGNOSIS — Z7951 Long term (current) use of inhaled steroids: Secondary | ICD-10-CM | POA: Diagnosis not present

## 2017-09-05 DIAGNOSIS — Z9841 Cataract extraction status, right eye: Secondary | ICD-10-CM | POA: Diagnosis not present

## 2017-09-05 DIAGNOSIS — R031 Nonspecific low blood-pressure reading: Secondary | ICD-10-CM | POA: Diagnosis not present

## 2017-09-05 DIAGNOSIS — Z885 Allergy status to narcotic agent status: Secondary | ICD-10-CM

## 2017-09-05 DIAGNOSIS — Z87891 Personal history of nicotine dependence: Secondary | ICD-10-CM

## 2017-09-05 DIAGNOSIS — R0602 Shortness of breath: Secondary | ICD-10-CM | POA: Diagnosis not present

## 2017-09-05 DIAGNOSIS — D62 Acute posthemorrhagic anemia: Secondary | ICD-10-CM | POA: Diagnosis not present

## 2017-09-05 DIAGNOSIS — F329 Major depressive disorder, single episode, unspecified: Secondary | ICD-10-CM | POA: Diagnosis present

## 2017-09-05 DIAGNOSIS — R41841 Cognitive communication deficit: Secondary | ICD-10-CM | POA: Diagnosis not present

## 2017-09-05 DIAGNOSIS — H409 Unspecified glaucoma: Secondary | ICD-10-CM | POA: Diagnosis present

## 2017-09-05 DIAGNOSIS — Z8052 Family history of malignant neoplasm of bladder: Secondary | ICD-10-CM

## 2017-09-05 HISTORY — DX: Basal cell carcinoma of skin, unspecified: C44.91

## 2017-09-05 HISTORY — DX: Unspecified glaucoma: H40.9

## 2017-09-05 HISTORY — DX: Tinnitus, bilateral: H93.13

## 2017-09-05 HISTORY — DX: Headache, unspecified: R51.9

## 2017-09-05 HISTORY — DX: Unspecified macular degeneration: H35.30

## 2017-09-05 HISTORY — DX: Headache: R51

## 2017-09-05 LAB — CBC WITH DIFFERENTIAL/PLATELET
Basophils Absolute: 0 10*3/uL (ref 0.0–0.1)
Basophils Relative: 0 %
EOS PCT: 0 %
Eosinophils Absolute: 0 10*3/uL (ref 0.0–0.7)
HCT: 28.5 % — ABNORMAL LOW (ref 36.0–46.0)
Hemoglobin: 9.4 g/dL — ABNORMAL LOW (ref 12.0–15.0)
LYMPHS ABS: 1.1 10*3/uL (ref 0.7–4.0)
Lymphocytes Relative: 8 %
MCH: 30.1 pg (ref 26.0–34.0)
MCHC: 33 g/dL (ref 30.0–36.0)
MCV: 91.3 fL (ref 78.0–100.0)
MONO ABS: 1.3 10*3/uL — AB (ref 0.1–1.0)
Monocytes Relative: 10 %
Neutro Abs: 10.6 10*3/uL — ABNORMAL HIGH (ref 1.7–7.7)
Neutrophils Relative %: 82 %
PLATELETS: 144 10*3/uL — AB (ref 150–400)
RBC: 3.12 MIL/uL — ABNORMAL LOW (ref 3.87–5.11)
RDW: 16.5 % — AB (ref 11.5–15.5)
WBC: 12.9 10*3/uL — ABNORMAL HIGH (ref 4.0–10.5)

## 2017-09-05 LAB — COMPREHENSIVE METABOLIC PANEL
ALBUMIN: 2.8 g/dL — AB (ref 3.5–5.0)
ALK PHOS: 56 U/L (ref 38–126)
ALT: 11 U/L — AB (ref 14–54)
AST: 23 U/L (ref 15–41)
Anion gap: 9 (ref 5–15)
BUN: 15 mg/dL (ref 6–20)
CALCIUM: 8.1 mg/dL — AB (ref 8.9–10.3)
CO2: 22 mmol/L (ref 22–32)
CREATININE: 0.64 mg/dL (ref 0.44–1.00)
Chloride: 105 mmol/L (ref 101–111)
GFR calc non Af Amer: >60 mL/min (ref >60)
GLUCOSE: 145 mg/dL — AB (ref 65–99)
Potassium: 4.4 mmol/L (ref 3.5–5.1)
SODIUM: 136 mmol/L (ref 135–145)
Total Bilirubin: 1.3 mg/dL — ABNORMAL HIGH (ref 0.3–1.2)
Total Protein: 5.5 g/dL — ABNORMAL LOW (ref 6.5–8.1)

## 2017-09-05 LAB — HEMOGLOBIN AND HEMATOCRIT, BLOOD
HCT: 30.3 % — ABNORMAL LOW (ref 36.0–46.0)
HEMATOCRIT: 29.3 % — AB (ref 36.0–46.0)
HEMOGLOBIN: 9.3 g/dL — AB (ref 12.0–15.0)
Hemoglobin: 9.8 g/dL — ABNORMAL LOW (ref 12.0–15.0)

## 2017-09-05 LAB — CBG MONITORING, ED: Glucose-Capillary: 145 mg/dL — ABNORMAL HIGH (ref 65–99)

## 2017-09-05 LAB — POC OCCULT BLOOD, ED: Fecal Occult Bld: POSITIVE — AB

## 2017-09-05 MED ORDER — SODIUM CHLORIDE 0.9 % IV SOLN: 250.0000 mL | INTRAVENOUS | Status: DC | PRN

## 2017-09-05 MED ORDER — VITAMIN C 500 MG PO TABS
1000.0000 mg | ORAL_TABLET | Freq: Every day | ORAL | Status: DC
  Administered 2017-09-06 – 2017-09-14 (×8): 1000 mg via ORAL
  Filled 2017-09-05 (×9): qty 2

## 2017-09-05 MED ORDER — SODIUM CHLORIDE 0.9% FLUSH: 3.0000 mL | INTRAVENOUS | Status: DC | PRN

## 2017-09-05 MED ORDER — PROBIOTIC-10 PO CAPS: 1.0000 | ORAL_CAPSULE | Freq: Every day | ORAL | Status: DC

## 2017-09-05 MED ORDER — ADULT MULTIVITAMIN W/MINERALS CH
1.0000 | ORAL_TABLET | Freq: Every day | ORAL | Status: DC
  Administered 2017-09-05 – 2017-09-14 (×9): 1 via ORAL
  Filled 2017-09-05 (×9): qty 1

## 2017-09-05 MED ORDER — ACETAMINOPHEN 650 MG RE SUPP: 650.0000 mg | Freq: Four times a day (QID) | RECTAL | Status: DC | PRN

## 2017-09-05 MED ORDER — PANTOPRAZOLE SODIUM 40 MG PO TBEC
40.0000 mg | DELAYED_RELEASE_TABLET | Freq: Every day | ORAL | Status: DC
  Administered 2017-09-05 – 2017-09-14 (×9): 40 mg via ORAL
  Filled 2017-09-05 (×9): qty 1

## 2017-09-05 MED ORDER — SIMVASTATIN 20 MG PO TABS
20.0000 mg | ORAL_TABLET | Freq: Every day | ORAL | Status: DC
  Administered 2017-09-05 – 2017-09-14 (×9): 20 mg via ORAL
  Filled 2017-09-05 (×9): qty 1

## 2017-09-05 MED ORDER — SODIUM CHLORIDE 0.9% FLUSH
3.0000 mL | Freq: Two times a day (BID) | INTRAVENOUS | Status: DC
  Administered 2017-09-05 – 2017-09-13 (×6): 3 mL via INTRAVENOUS

## 2017-09-05 MED ORDER — ALPRAZOLAM 0.25 MG PO TABS: 0.2500 mg | ORAL_TABLET | Freq: Every day | ORAL | Status: DC | PRN

## 2017-09-05 MED ORDER — POLYETHYLENE GLYCOL 3350 17 G PO PACK
17.0000 g | PACK | Freq: Every day | ORAL | Status: DC
  Administered 2017-09-05 – 2017-09-13 (×7): 17 g via ORAL
  Filled 2017-09-05 (×9): qty 1

## 2017-09-05 MED ORDER — OXYCODONE HCL 5 MG PO TABS: 5.0000 mg | ORAL_TABLET | ORAL | Status: DC | PRN

## 2017-09-05 MED ORDER — ONDANSETRON HCL 4 MG PO TABS: 4.0000 mg | ORAL_TABLET | Freq: Four times a day (QID) | ORAL | Status: DC | PRN

## 2017-09-05 MED ORDER — ONDANSETRON HCL 4 MG/2ML IJ SOLN
4.0000 mg | Freq: Four times a day (QID) | INTRAMUSCULAR | Status: DC | PRN
  Administered 2017-09-09: 4 mg via INTRAVENOUS
  Filled 2017-09-05: qty 2

## 2017-09-05 MED ORDER — LOPERAMIDE HCL 2 MG PO CAPS: 4.0000 mg | ORAL_CAPSULE | ORAL | Status: DC | PRN

## 2017-09-05 MED ORDER — RISAQUAD PO CAPS
1.0000 | ORAL_CAPSULE | Freq: Every day | ORAL | Status: DC
  Administered 2017-09-05 – 2017-09-14 (×9): 1 via ORAL
  Filled 2017-09-05 (×9): qty 1

## 2017-09-05 MED ORDER — SERTRALINE HCL 100 MG PO TABS
100.0000 mg | ORAL_TABLET | Freq: Every day | ORAL | Status: DC
  Administered 2017-09-06 – 2017-09-14 (×8): 100 mg via ORAL
  Filled 2017-09-05 (×8): qty 1

## 2017-09-05 MED ORDER — VITAMIN D 1000 UNITS PO TABS
2000.0000 [IU] | ORAL_TABLET | Freq: Two times a day (BID) | ORAL | Status: DC
  Administered 2017-09-05 – 2017-09-14 (×17): 2000 [IU] via ORAL
  Filled 2017-09-05 (×18): qty 2

## 2017-09-05 MED ORDER — ACETAMINOPHEN 325 MG PO TABS
650.0000 mg | ORAL_TABLET | Freq: Four times a day (QID) | ORAL | Status: DC | PRN
  Administered 2017-09-05 – 2017-09-12 (×12): 650 mg via ORAL
  Filled 2017-09-05 (×12): qty 2

## 2017-09-05 MED ORDER — DORZOLAMIDE HCL-TIMOLOL MAL 2-0.5 % OP SOLN
1.0000 [drp] | Freq: Two times a day (BID) | OPHTHALMIC | Status: DC
  Administered 2017-09-05 – 2017-09-14 (×17): 1 [drp] via OPHTHALMIC
  Filled 2017-09-05 (×3): qty 10

## 2017-09-05 MED ORDER — ALBUTEROL SULFATE (2.5 MG/3ML) 0.083% IN NEBU
3.0000 mL | INHALATION_SOLUTION | Freq: Two times a day (BID) | RESPIRATORY_TRACT | Status: DC
  Administered 2017-09-06 – 2017-09-14 (×17): 3 mL via RESPIRATORY_TRACT
  Filled 2017-09-05 (×18): qty 3

## 2017-09-05 MED ORDER — FLUTICASONE PROPIONATE 50 MCG/ACT NA SUSP
2.0000 | Freq: Every day | NASAL | Status: DC
  Administered 2017-09-06 – 2017-09-14 (×8): 2 via NASAL
  Filled 2017-09-05 (×2): qty 16

## 2017-09-05 MED ORDER — ICAPS AREDS 2 PO CAPS: ORAL_CAPSULE | Freq: Two times a day (BID) | ORAL | Status: DC

## 2017-09-05 MED ORDER — SODIUM CHLORIDE 0.9 % IV BOLUS (SEPSIS)
500.0000 mL | Freq: Once | INTRAVENOUS | Status: AC
  Administered 2017-09-05: 500 mL via INTRAVENOUS

## 2017-09-05 MED ORDER — LORATADINE 10 MG PO TABS
10.0000 mg | ORAL_TABLET | Freq: Every day | ORAL | Status: DC
  Administered 2017-09-05 – 2017-09-14 (×9): 10 mg via ORAL
  Filled 2017-09-05 (×9): qty 1

## 2017-09-05 MED ORDER — ALBUTEROL SULFATE (2.5 MG/3ML) 0.083% IN NEBU
3.0000 mL | INHALATION_SOLUTION | RESPIRATORY_TRACT | Status: DC
  Administered 2017-09-05: 3 mL via RESPIRATORY_TRACT
  Filled 2017-09-05: qty 3

## 2017-09-05 MED ORDER — POTASSIUM CHLORIDE IN NACL 20-0.9 MEQ/L-% IV SOLN
INTRAVENOUS | Status: DC
  Administered 2017-09-05 – 2017-09-07 (×4): via INTRAVENOUS
  Filled 2017-09-05 (×4): qty 1000

## 2017-09-05 MED ORDER — HYDROCODONE-ACETAMINOPHEN 5-325 MG PO TABS: 1.0000 | ORAL_TABLET | Freq: Four times a day (QID) | ORAL | Status: DC | PRN

## 2017-09-05 MED ORDER — PROPRANOLOL HCL 10 MG PO TABS
20.0000 mg | ORAL_TABLET | Freq: Every day | ORAL | Status: DC
  Administered 2017-09-06 – 2017-09-08 (×3): 20 mg via ORAL
  Filled 2017-09-05 (×3): qty 2

## 2017-09-05 NOTE — H&P (Signed)
History and Physical    [name]:[id number] DOB: May 27, 1941 DOA: 09/05/2017  PCP: [name], MD  Patient coming from: Home  I have personally briefly reviewed patient's old medical records in [place]  Chief Complaint: Rectal bleeding  HPI: [name] is a 77 y.o. 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  She reports 1 day of bright red blood per rectum with 6-7 bloody bowel movements at home since last night.  He said she has had a previous episode of this is documented above.  Resented to the ER with blood pressures in the 100 range.  Hemoglobin has dropped from 12.9 on February 10 to 9.4 today.  Emergency department she received some IV fluids which helped with her blood pressures.  She denies any nausea vomiting diarrhea fever chills constipation, blurry vision double vision headaches, cough chest pain or shortness of breath.  Review of Systems: As per HPI otherwise all other systems reviewed and  negative.    Past Medical History:  Diagnosis Date  . Anxiety   . Arthritis    "bilateral knees, shoulders, elbows; pretty widespread"  . BPPV (benign paroxysmal positional vertigo)   . Cancer ([place])    H/O basal skin cancer 2001  . Depression   . GERD (gastroesophageal reflux disease)   . Glaucoma   . History of blood transfusion ~ 2008   "right after but not related to left knee replacement"  . Hyperlipemia   . Lower GI bleeding ~ 2008   "had to have blood transfusion"  . Macular degeneration   . Osteopenia   . Seasonal allergies   .  Sleeping excessive   . Tinnitus of right ear     Past Surgical History:  Procedure Laterality Date  . CATARACT EXTRACTION W/ INTRAOCULAR LENS  IMPLANT, BILATERAL  1990's  . JOINT REPLACEMENT     lft knee  . TONSILLECTOMY AND ADENOIDECTOMY  1946  . TOTAL KNEE ARTHROPLASTY  ~ 2008; 12/20/11   left; right  . TOTAL KNEE ARTHROPLASTY  12/20/2011   Procedure: TOTAL KNEE ARTHROPLASTY;  Surgeon: Augustin Schooling, MD;  Location: [facility];  Service: Orthopedics;  Laterality: Right;  Right Total Knee Arthroplasty  . TUBAL LIGATION  1980's     reports that she quit smoking about 22 years ago. She has a 20.00 pack-year smoking history. she has never used smokeless tobacco. She reports that she drinks about 3.0 oz of alcohol per week. She reports that she does not use drugs.  Allergies  Allergen Reactions  . Morphine And Related Itching  . Cefadroxil Hives    Patient can take amoxicillin and cipro    Family History  Problem Relation Age of Onset  . Heart attack Mother   . Heart disease Father   . Heart failure Father   . Bladder Cancer Father   . Supraventricular tachycardia Sister   . Hypertension Sister      Prior to Admission medications   Medication Sig Start Date End Date Taking? Authorizing Provider  acetaminophen (TYLENOL) 500 MG tablet Take 1,000 mg by mouth daily as needed for headache.   Yes [provider]  albuterol (PROVENTIL HFA;VENTOLIN  HFA) 108 (90 Base) MCG/ACT inhaler Inhale 2 puffs into the lungs daily as needed. For seasonal allergies . Use 2 puffs 3 times daily x 4 days then back to home regimen. 08/10/17  Yes Eugenie Filler, MD  ALPRAZolam Duanne Moron) 0.25 MG tablet Take 0.25 mg by mouth daily as needed for anxiety.    Yes [provider]  cholecalciferol (VITAMIN D) 1000 UNITS tablet Take 2,000 Units by mouth 2 (two) times daily.    Yes [provider]  dorzolamide-timolol (COSOPT) 22.3-6.8 MG/ML ophthalmic solution Place 1 drop into both eyes  2 (two) times daily.   Yes [provider]  fluticasone (FLONASE) 50 MCG/ACT nasal spray Place 2 sprays into both nostrils daily. 08/11/17  Yes Eugenie Filler, MD  HYDROcodone-acetaminophen (NORCO) 5-325 MG per tablet Take 1-2 tablets by mouth every 6 (six) hours as needed for moderate pain. For pain   Yes [provider]  loperamide (IMODIUM) 2 MG capsule Take 4 mg by mouth as needed for diarrhea or loose stools.   Yes [provider]  loratadine (CLARITIN) 10 MG tablet Take 1 tablet (10 mg total) by mouth daily. 08/11/17  Yes Eugenie Filler, MD  Multiple Vitamin (MULTIVITAMIN WITH MINERALS) TABS Take 1 tablet by mouth daily.   Yes [provider]  Multiple Vitamins-Minerals (ICAPS AREDS 2 PO) Take 1 capsule by mouth 2 (two) times daily.   Yes [provider]  omeprazole (PRILOSEC) 20 MG capsule Take 20 mg by mouth daily.    Yes [provider]  polyethylene glycol (MIRALAX / GLYCOLAX) packet Take 17 g by mouth daily.   Yes [provider]  Probiotic Product (PROBIOTIC-10) CAPS Take 1 capsule by mouth daily.   Yes [provider]  propranolol (INDERAL) 20 MG tablet Take 20 mg by mouth daily.   Yes [provider]  sertraline (ZOLOFT) 100 MG tablet Take 100 mg by mouth daily.   Yes [provider]  simvastatin (ZOCOR) 20 MG tablet Take 20 mg by mouth daily.   Yes [provider]  vitamin C (ASCORBIC ACID) 500 MG tablet Take 1,000 mg by mouth daily.    Yes [provider]    Physical Exam: Vitals:   09/05/17 1515 09/05/17 1540 09/05/17 1542 09/05/17 1547  BP: 113/66 (!) 117/52 107/60 116/65  Pulse: (!) 27 91 91   Resp: (!) 27     Temp:  99.1 F (37.3 C)    TempSrc:  Oral    SpO2: 95% 97% 94% 95%  Weight:  84 kg (185 lb 3 oz)    Height:  5\' 2"  (1.575 m)     .TCS Constitutional: NAD, calm, comfortable Vitals:   09/05/17 1515 09/05/17 1540 09/05/17 1542 09/05/17 1547  BP:  113/66 (!) 117/52 107/60 116/65  Pulse: (!) 27 91 91   Resp: (!) 27     Temp:  99.1 F (37.3 C)    TempSrc:  Oral    SpO2: 95% 97% 94% 95%  Weight:  84 kg (185 lb 3 oz)    Height:  5\' 2"  (1.575 m)     Eyes: PERRL, lids and conjunctivae normal ENMT: Mucous membranes are moist. Posterior pharynx clear of any exudate or lesions.Normal dentition.  Neck: normal, supple, no masses, no thyromegaly Respiratory: clear to auscultation bilaterally, no wheezing, no crackles. Normal respiratory effort. No accessory muscle use.  Cardiovascular: Regular rate and rhythm, no murmurs / rubs / gallops. No extremity edema. 2+ pedal pulses.  No carotid bruits.  Abdomen: no tenderness, no masses palpated. No hepatosplenomegaly. Bowel sounds positive.  Heme positive stool is noted in the emergency department Musculoskeletal: no clubbing / cyanosis. No joint deformity upper and lower extremities. Good ROM, no contractures. Normal muscle tone.  Skin: no rashes, lesions, ulcers. No induration Neurologic: CN 2-12 grossly intact. Sensation intact, DTR normal. Strength 5/5 in all 4.  Psychiatric: Normal judgment and insight. Alert and oriented x 3. Normal mood.     Labs on Admission: I have personally reviewed following labs and imaging studies  CBC: Recent Labs  Lab 09/05/17 0858 09/05/17 1554  WBC 12.9*  --   NEUTROABS 10.6*  --   HGB 9.4* 9.3*  HCT 28.5* 29.3*  MCV 91.3  --   PLT 144*  --    Basic Metabolic Panel: Recent Labs  Lab 09/05/17 0858  NA 136  K 4.4  CL 105  CO2 22  GLUCOSE 145*  BUN 15  CREATININE 0.64  CALCIUM 8.1*   GFR: Estimated Creatinine Clearance: 58.3 mL/min (by C-G formula based on SCr of 0.64 mg/dL). Liver Function Tests: Recent Labs  Lab 09/05/17 0858  AST 23  ALT 11*  ALKPHOS 56  BILITOT 1.3*  PROT 5.5*  ALBUMIN 2.8*   No results for input(s): LIPASE, AMYLASE in the last 168 hours. No results for input(s): AMMONIA in the last 168 hours. Coagulation  Profile: No results for input(s): INR, PROTIME in the last 168 hours. Cardiac Enzymes: No results for input(s): CKTOTAL, CKMB, CKMBINDEX, TROPONINI in the last 168 hours. BNP (last 3 results) No results for input(s): PROBNP in the last 8760 hours. HbA1C: No results for input(s): HGBA1C in the last 72 hours. CBG: Recent Labs  Lab 09/05/17 0847  GLUCAP 145*   Lipid Profile: No results for input(s): CHOL, HDL, LDLCALC, TRIG, CHOLHDL, LDLDIRECT in the last 72 hours. Thyroid Function Tests: No results for input(s): TSH, T4TOTAL, FREET4, T3FREE, THYROIDAB in the last 72 hours. Anemia Panel: No results for input(s): VITAMINB12, FOLATE, FERRITIN, TIBC, IRON, RETICCTPCT in the last 72 hours. Urine analysis:    Component Value Date/Time   COLORURINE YELLOW 08/07/2017 [place] 08/07/2017 0949   LABSPEC 1.010 08/07/2017 0949   PHURINE 5.0 08/07/2017 0949   GLUCOSEU NEGATIVE 08/07/2017 0949   HGBUR NEGATIVE 08/07/2017 0949   BILIRUBINUR NEGATIVE 08/07/2017 0949   KETONESUR 20 (A) 08/07/2017 0949   PROTEINUR NEGATIVE 08/07/2017 0949   UROBILINOGEN 0.2 10/20/2007 2015   NITRITE NEGATIVE 08/07/2017 0949   LEUKOCYTESUR NEGATIVE 08/07/2017 0949    Radiological Exams on Admission: No results found.  EKG: Independently reviewed.  Sinus rhythm with ST lateral depression unchanged from February 2019.  Assessment/Plan Active Problems:   Diverticulosis of intestine with bleeding  1.  Diverticulosis of intestine with bleeding.  Patient will be admitted into the hospital and serial H&H's will be obtained.  If her hemoglobin drops less than 8 will need to transfuse blood.  She has previously undergone a bleeding scan which was unremarkable many years ago.  Her last colonoscopy was in 2012.  She had an episode of bleeding in April 2018 which fortunately did not require any surgical intervention.  Consult GI and monitor closely.   2.  Hyperlipidemia continue simvastatin.  3.   Glaucoma with macular degeneration continue her home eyedrops.  4.  Hypertension continue propranolol.  5.  Depression and anxiety continue her SSRI and as needed low-dose Xanax.   DVT prophylaxis: SCDs Code Status: Full  code Family Communication: No family present at the time of evaluation patient is alert and retains capacity Disposition Plan: Likely home in 3-4 days Consults called: GI Admission status: Inpatient   Lady Deutscher MD [facility] Pager [phone]  If 7PM-7AM, please contact night-coverage [email or web address] Password [facility]  09/05/2017, 6:17 PM

## 2017-09-05 NOTE — ED Triage Notes (Signed)
Per family patient had blood in toilet this am with "funny smell". Patient not able to determine blood because of inability to distinguished colors correctly. Patient seen here on Tuesday for a fall and exhibits racoon eye and nose bridge bruising.

## 2017-09-05 NOTE — ED Notes (Signed)
Attempted report 

## 2017-09-05 NOTE — Progress Notes (Addendum)
1520 Received pt from ED, A&O x4. Pleasant and cooperative. Denies abd pain. No active rectal bleeding at this time. Continent of urine, assisted to bedside commode.  1830 Pt at the bedside commode, had about 200 ml of liquid bloody stools. Message sent to Dr [name].

## 2017-09-05 NOTE — Consult Note (Signed)
[facility] Consult  Referring Provider: Nat Christen, MD(ER) Primary Care Physician:  [name], MD Primary Gastroenterologist: Dr.Edwards  Reason for Consultation: Rectal bleeding  HPI: [name] is a 77 y.o. female states that she was in her usual state of health till yesterday evening when she noticed multiple episodes of loose stools. Patient states she does not have good color vision and did not realize that stools were bloody until her husband noticed that her bowel movement today morning was bloody. She thinks she started rectal bleeding yesterday itself and has had at least 3-5 of those episodes. She denies abdominal pain or rectal pain. Patient states that 10 years ago she presented with similar painless rectal bleeding had a colonoscopy and was told that bleeding was from diverticulosis. Since then, she avoids NSAIDs any other blood thinners and takes a stool softener and MiraLAX every day. She had an endoscopy as an outpatient in July 2008 for dysphagia which was unremarkable. Tuesday she had a fall(09/02/2017) and was evaluated with a CAT scan of the head and cervical spine which showed a frontal scalp contusion without associated fracture hemorrhage. Patient states that 2-3 weeks ago she had flu which resulted in myalgia and complete lack of appetite along with 10-15 pound weight loss. She was admitted in 4/18 with similar rectal bleeding presumed to be from diverticulosis.   Past Medical History:  Diagnosis Date  . Anxiety   . Arthritis    "bilateral knees, shoulders, elbows; pretty widespread"  . BPPV (benign paroxysmal positional vertigo)   . Cancer ([place])    H/O basal skin cancer 2001  . Depression   . GERD (gastroesophageal reflux disease)   . Glaucoma   . History of blood transfusion ~ 2008   "right after but not related to left knee replacement"  . Hyperlipemia   . Lower GI bleeding ~ 2008   "had to have blood transfusion"  . Macular degeneration    . Osteopenia   . Seasonal allergies   . Sleeping excessive   . Tinnitus of right ear     Past Surgical History:  Procedure Laterality Date  . CATARACT EXTRACTION W/ INTRAOCULAR LENS  IMPLANT, BILATERAL  1990's  . JOINT REPLACEMENT     lft knee  . TONSILLECTOMY AND ADENOIDECTOMY  1946  . TOTAL KNEE ARTHROPLASTY  ~ 2008; 12/20/11   left; right  . TOTAL KNEE ARTHROPLASTY  12/20/2011   Procedure: TOTAL KNEE ARTHROPLASTY;  Surgeon: Augustin Schooling, MD;  Location: [facility];  Service: Orthopedics;  Laterality: Right;  Right Total Knee Arthroplasty  . TUBAL LIGATION  1980's    Prior to Admission medications   Medication Sig Start Date End Date Taking? Authorizing Provider  acetaminophen (TYLENOL) 500 MG tablet Take 1,000 mg by mouth daily as needed for headache.   Yes [provider]  albuterol (PROVENTIL HFA;VENTOLIN HFA) 108 (90 Base) MCG/ACT inhaler Inhale 2 puffs into the lungs daily as needed. For seasonal allergies . Use 2 puffs 3 times daily x 4 days then back to home regimen. 08/10/17  Yes Eugenie Filler, MD  ALPRAZolam Duanne Moron) 0.25 MG tablet Take 0.25 mg by mouth daily as needed for anxiety.    Yes [provider]  cholecalciferol (VITAMIN D) 1000 UNITS tablet Take 2,000 Units by mouth 2 (two) times daily.    Yes [provider]  dorzolamide-timolol (COSOPT) 22.3-6.8 MG/ML ophthalmic solution Place 1 drop into both eyes 2 (two) times daily.   Yes [provider]  fluticasone (FLONASE) 50 MCG/ACT nasal spray Place 2 sprays into both nostrils daily. 08/11/17  Yes Eugenie Filler, MD  HYDROcodone-acetaminophen (NORCO) 5-325 MG per tablet Take 1-2 tablets by mouth every 6 (six) hours as needed for moderate pain. For pain   Yes [provider]  loperamide (IMODIUM) 2 MG capsule Take 4 mg by mouth as needed for diarrhea or loose stools.   Yes [provider]  loratadine (CLARITIN) 10 MG tablet Take 1 tablet (10 mg total) by mouth daily.  08/11/17  Yes Eugenie Filler, MD  Multiple Vitamin (MULTIVITAMIN WITH MINERALS) TABS Take 1 tablet by mouth daily.   Yes [provider]  Multiple Vitamins-Minerals (ICAPS AREDS 2 PO) Take 1 capsule by mouth 2 (two) times daily.   Yes [provider]  omeprazole (PRILOSEC) 20 MG capsule Take 20 mg by mouth daily.    Yes [provider]  polyethylene glycol (MIRALAX / GLYCOLAX) packet Take 17 g by mouth daily.   Yes [provider]  Probiotic Product (PROBIOTIC-10) CAPS Take 1 capsule by mouth daily.   Yes [provider]  propranolol (INDERAL) 20 MG tablet Take 20 mg by mouth daily.   Yes [provider]  sertraline (ZOLOFT) 100 MG tablet Take 100 mg by mouth daily.   Yes [provider]  simvastatin (ZOCOR) 20 MG tablet Take 20 mg by mouth daily.   Yes [provider]  vitamin C (ASCORBIC ACID) 500 MG tablet Take 1,000 mg by mouth daily.    Yes [provider]    Current Facility-Administered Medications  Medication Dose Route Frequency Provider Last Rate Last Dose  . 0.9 %  sodium chloride infusion  250 mL Intravenous PRN Lady Deutscher, MD      . 0.9 % NaCl with KCl 20 mEq/ L  infusion   Intravenous Continuous Lady Deutscher, MD      . acetaminophen (TYLENOL) tablet 650 mg  650 mg Oral Q6H PRN Lady Deutscher, MD       Or  . acetaminophen (TYLENOL) suppository 650 mg  650 mg Rectal Q6H PRN Lady Deutscher, MD      . ondansetron [facility]) tablet 4 mg  4 mg Oral Q6H PRN Lady Deutscher, MD       Or  . ondansetron [facility]) injection 4 mg  4 mg Intravenous Q6H PRN Lady Deutscher, MD      . oxyCODONE (Oxy IR/ROXICODONE) immediate release tablet 5 mg  5 mg Oral Q4H PRN Lady Deutscher, MD      . sodium chloride flush (NS) 0.9 % injection 3 mL  3 mL Intravenous Q12H Lady Deutscher, MD      . sodium chloride flush (NS) 0.9 % injection 3 mL  3 mL Intravenous PRN Lady Deutscher, MD         Allergies as of 09/05/2017 - Review Complete 09/05/2017  Allergen Reaction Noted  . Morphine and related Itching 11/29/2011  . Cefadroxil Hives 12/11/2011    Family History  Problem Relation Age of Onset  . Heart attack Mother   . Heart disease Father   . Heart failure Father   . Bladder Cancer Father   . Supraventricular tachycardia Sister   . Hypertension Sister     Social History   Socioeconomic History  . Marital status: Married    Spouse name: Clair Gulling   . Number of children: 3  . Years of education: College  . Highest  education level: Not on file  Social Needs  . Financial resource strain: Not on file  . Food insecurity - worry: Not on file  . Food insecurity - inability: Not on file  . Transportation needs - medical: Not on file  . Transportation needs - non-medical: Not on file  Occupational History  . Occupation: Retired Pharmacist, hospital  Tobacco Use  . Smoking status: Former Smoker    Packs/day: 1.00    Years: 20.00    Pack years: 20.00    Last attempt to quit: 07/02/1995    Years since quitting: 22.1  . Smokeless tobacco: Never Used  . Tobacco comment: 12/23/11 "quit smoking ~ 15 years ago; smoked about 20 years"  Substance and Sexual Activity  . Alcohol use: Yes    Alcohol/week: 3.0 oz    Types: 5 Glasses of wine per week    Comment: 12/23/11 "6 oz wine, 5 days/wk"  . Drug use: No  . Sexual activity: Yes  Other Topics Concern  . Not on file  Social History Narrative   1-2 cups of coffee a day     Review of Systems: Positive for: GI: Described in detail in HPI.    Gen:  anorexia, fatigue, weakness, malaise, involuntary weight loss, Denies any fever, chills, rigors, night sweats,and sleep disorder CV: Denies chest pain, angina, palpitations, syncope, orthopnea, PND, peripheral edema, and claudication. Resp: Denies dyspnea, cough, sputum, wheezing, coughing up blood. GU : Denies urinary burning, blood in urine, urinary frequency, urinary hesitancy, nocturnal  urination, and urinary incontinence. MS: Denies joint pain or swelling.  Denies muscle weakness, cramps, atrophy.  Derm: Denies rash, itching, oral ulcerations, hives, unhealing ulcers.  Psych: Denies depression, anxiety, memory loss, suicidal ideation, hallucinations,  and confusion. Heme: Rectal bleeding, Denies bruising and enlarged lymph nodes. Neuro:  Denies any headaches, dizziness, paresthesias. Endo:  Denies any problems with DM, thyroid, adrenal function.  Physical Exam: Vital signs in last 24 hours: Temp:  [98.2 F (36.8 C)] 98.2 F (36.8 C) (03/08 0841) Pulse Rate:  [27-99] 27 (03/08 1515) Resp:  [16-31] 27 (03/08 1515) BP: (85-119)/(51-75) 113/66 (03/08 1515) SpO2:  [85 %-98 %] 95 % (03/08 1515) Weight:  [82.6 kg (182 lb)] 82.6 kg (182 lb) (03/08 0838)    General:   Alert,  Well-developed, well-nourished, pleasant and cooperative in NAD Head:  Normocephalic, bruises noted over bilateral eyes and left frontal scalp. Eyes:  Sclera clear, no icterus.   Mild pallor Ears:  Normal auditory acuity. Nose:  No deformity, discharge,  or lesions. Mouth:  No deformity or lesions.  Oropharynx pink & moist. Neck:  Supple; no masses or thyromegaly. Lungs:  Clear throughout to auscultation.   No wheezes, crackles, or rhonchi. No acute distress. Heart:  Regular rate and rhythm; no murmurs, clicks, rubs,  or gallops. Extremities:  Without clubbing or edema. Neurologic:  Alert and  oriented x4;  grossly normal neurologically. Skin:  Intact without significant lesions or rashes. Psych:  Alert and cooperative. Normal mood and affect. Abdomen:  Soft, nontender and nondistended. No masses, hepatosplenomegaly or hernias noted. Normal bowel sounds, without guarding, and without rebound.         Lab Results: Recent Labs    09/05/17 0858  WBC 12.9*  HGB 9.4*  HCT 28.5*  PLT 144*   BMET Recent Labs    09/05/17 0858  NA 136  K 4.4  CL 105  CO2 22  GLUCOSE 145*  BUN 15   CREATININE 0.64  CALCIUM 8.1*  LFT Recent Labs    09/05/17 0858  PROT 5.5*  ALBUMIN 2.8*  AST 23  ALT 11*  ALKPHOS 56  BILITOT 1.3*   PT/INR No results for input(s): LABPROT, INR in the last 72 hours.  Studies/Results: No results found.  Impression: Painless hematochezia likely diverticular in origin. Patient reports last colonoscopy 10 years ago and was told she had diverticulosis. Hemoglobin 9.4 on admission, was 12.4 a month ago. Normal BUN/creatinine ratio, upper GI bleeding less likely. Hemodynamically stable.  Plan: Clear liquid diet. Monitor H&H and clinical condition. If there is further rectal bleeding noted or reported, then please get a stat bleeding scan and IR guided embolization as needed. Transfuse to keep hemoglobin more than 7.   LOS: 0 days   Ronnette Juniper, M.D.  09/05/2017, 3:42 PM  Pager [phone] If no answer or after 5 PM call [phone]

## 2017-09-05 NOTE — Progress Notes (Signed)
[facility] floor coverage called this RN to inform attending MD in the morning regarding the request of patient's son, [name], to have a repeat CT head of patient to rule out any subarachnoid bleed of patient from previous fall on 09/02/2017. Patient admitted for rectal bleed and on H & H Q6.  Will endorse this concern to day shift RN appropriately.

## 2017-09-05 NOTE — ED Provider Notes (Signed)
[facility] EM[facility] DEPARTMENT Provider Note   CSN: [id number] Arrival date & time: 09/05/17  0830     History   Chief Complaint Chief Complaint  Patient presents with  . Rectal Bleeding    HPI [name] is a 77 y.o. female.  Level 5 caveat for urgent need for intervention.  Patient presents with rectal bleeding for the past 12-24 hours.  Family member was able to visualize bright red blood.  Per the patient's history, this is happened on 2 other occasions.  She was admitted on 10/14/16 for similar concerns.  A GI consult by Dr. Lizbeth Bark suggest a diverticular bleed.  She was also evaluated in the past by Dr. Laurence Spates with an unknown diagnosis.  She lives independently at home with her husband.  She feels weak today.  She had an accidental fall within the past week and has subsequent ecchymosis around both eyes.      Past Medical History:  Diagnosis Date  . Anxiety   . Arthritis    "bilateral knees, shoulders, elbows; pretty widespread"  . BPPV (benign paroxysmal positional vertigo)   . Cancer ([place])    H/O basal skin cancer 2001  . Depression   . GERD (gastroesophageal reflux disease)   . Glaucoma   . History of blood transfusion ~ 2008   "right after but not related to left knee replacement"  . Hyperlipemia   . Lower GI bleeding ~ 2008   "had to have blood transfusion"  . Macular degeneration   . Osteopenia   . Seasonal allergies   . Sleeping excessive   . Tinnitus of right ear     Patient Active Problem List   Diagnosis Date Noted  . Influenza A 08/08/2017  . Acute respiratory failure with hypoxia ([place]) 08/07/2017  . Depression 08/07/2017  . Hyperlipemia 08/07/2017  . Macular degeneration 08/07/2017  . Glaucoma 08/07/2017  . Weakness 08/07/2017  . Dehydration 08/07/2017  . Lower GI bleeding 10/14/2016  . Diverticulosis of colon 10/14/2016  . Anxiety 10/14/2016  . Lower GI bleed 10/14/2016  . Acute GI bleeding   .  Osteoarthrosis, unspecified whether generalized or localized, lower leg 12/20/2011    Past Surgical History:  Procedure Laterality Date  . CATARACT EXTRACTION W/ INTRAOCULAR LENS  IMPLANT, BILATERAL  1990's  . JOINT REPLACEMENT     lft knee  . TONSILLECTOMY AND ADENOIDECTOMY  1946  . TOTAL KNEE ARTHROPLASTY  ~ 2008; 12/20/11   left; right  . TOTAL KNEE ARTHROPLASTY  12/20/2011   Procedure: TOTAL KNEE ARTHROPLASTY;  Surgeon: Augustin Schooling, MD;  Location: [facility];  Service: Orthopedics;  Laterality: Right;  Right Total Knee Arthroplasty  . TUBAL LIGATION  1980's    OB History    No data available       Home Medications    Prior to Admission medications   Medication Sig Start Date End Date Taking? Authorizing Provider  acetaminophen (TYLENOL) 500 MG tablet Take 1,000 mg by mouth daily as needed for headache.   Yes [provider]  albuterol (PROVENTIL HFA;VENTOLIN HFA) 108 (90 Base) MCG/ACT inhaler Inhale 2 puffs into the lungs daily as needed. For seasonal allergies . Use 2 puffs 3 times daily x 4 days then back to home regimen. 08/10/17  Yes Eugenie Filler, MD  ALPRAZolam Duanne Moron) 0.25 MG tablet Take 0.25 mg by mouth daily as needed for anxiety.    Yes [provider]  cholecalciferol (VITAMIN D) 1000 UNITS  tablet Take 2,000 Units by mouth 2 (two) times daily.    Yes [provider]  dorzolamide-timolol (COSOPT) 22.3-6.8 MG/ML ophthalmic solution Place 1 drop into both eyes 2 (two) times daily.   Yes [provider]  fluticasone (FLONASE) 50 MCG/ACT nasal spray Place 2 sprays into both nostrils daily. 08/11/17  Yes Eugenie Filler, MD  HYDROcodone-acetaminophen (NORCO) 5-325 MG per tablet Take 1-2 tablets by mouth every 6 (six) hours as needed for moderate pain. For pain   Yes [provider]  loperamide (IMODIUM) 2 MG capsule Take 4 mg by mouth as needed for diarrhea or loose stools.   Yes [provider]  loratadine  (CLARITIN) 10 MG tablet Take 1 tablet (10 mg total) by mouth daily. 08/11/17  Yes Eugenie Filler, MD  Multiple Vitamin (MULTIVITAMIN WITH MINERALS) TABS Take 1 tablet by mouth daily.   Yes [provider]  Multiple Vitamins-Minerals (ICAPS AREDS 2 PO) Take 1 capsule by mouth 2 (two) times daily.   Yes [provider]  omeprazole (PRILOSEC) 20 MG capsule Take 20 mg by mouth daily.    Yes [provider]  polyethylene glycol (MIRALAX / GLYCOLAX) packet Take 17 g by mouth daily.   Yes [provider]  Probiotic Product (PROBIOTIC-10) CAPS Take 1 capsule by mouth daily.   Yes [provider]  propranolol (INDERAL) 20 MG tablet Take 20 mg by mouth daily.   Yes [provider]  sertraline (ZOLOFT) 100 MG tablet Take 100 mg by mouth daily.   Yes [provider]  simvastatin (ZOCOR) 20 MG tablet Take 20 mg by mouth daily.   Yes [provider]  vitamin C (ASCORBIC ACID) 500 MG tablet Take 1,000 mg by mouth daily.    Yes [provider]    Family History Family History  Problem Relation Age of Onset  . Heart attack Mother   . Heart disease Father   . Heart failure Father   . Bladder Cancer Father   . Supraventricular tachycardia Sister   . Hypertension Sister     Social History Social History   Tobacco Use  . Smoking status: Former Smoker    Packs/day: 1.00    Years: 20.00    Pack years: 20.00    Last attempt to quit: 07/02/1995    Years since quitting: 22.1  . Smokeless tobacco: Never Used  . Tobacco comment: 12/23/11 "quit smoking ~ 15 years ago; smoked about 20 years"  Substance Use Topics  . Alcohol use: Yes    Alcohol/week: 3.0 oz    Types: 5 Glasses of wine per week    Comment: 12/23/11 "6 oz wine, 5 days/wk"  . Drug use: No     Allergies   Morphine and related and Cefadroxil   Review of Systems Review of Systems  Unable to perform ROS: Acuity of condition     Physical Exam Updated  Vital Signs BP (!) 97/56   Pulse 86   Temp 98.2 F (36.8 C) (Oral)   Resp 16   Ht 5\' 2"  (1.575 m)   Wt 82.6 kg (182 lb)   SpO2 95%   BMI 33.29 kg/m   Physical Exam  Constitutional: She is oriented to person, place, and time.  Pale, nad  HENT:  Head: Normocephalic.  Ecchymosis surrounding both eyes  Eyes: Conjunctivae are normal.  Neck: Neck supple.  Cardiovascular: Normal rate and regular rhythm.  Pulmonary/Chest: Effort normal and breath sounds normal.  Abdominal: Soft. Bowel  sounds are normal.  Genitourinary:  Genitourinary Comments: Rectal exam: No masses, obvious blood, heme positive  Musculoskeletal: Normal range of motion.  Neurological: She is alert and oriented to person, place, and time.  Skin: Skin is warm and dry.  Psychiatric: She has a normal mood and affect. Her behavior is normal.  Nursing note and vitals reviewed.    ED Treatments / Results  Labs (all labs ordered are listed, but only abnormal results are displayed) Labs Reviewed  CBC WITH DIFFERENTIAL/PLATELET - Abnormal; Notable for the following components:      Result Value   WBC 12.9 (*)    RBC 3.12 (*)    Hemoglobin 9.4 (*)    HCT 28.5 (*)    RDW 16.5 (*)    Platelets 144 (*)    Neutro Abs 10.6 (*)    Monocytes Absolute 1.3 (*)    All other components within normal limits  COMPREHENSIVE METABOLIC PANEL - Abnormal; Notable for the following components:   Glucose, Bld 145 (*)    Calcium 8.1 (*)    Total Protein 5.5 (*)    Albumin 2.8 (*)    ALT 11 (*)    Total Bilirubin 1.3 (*)    All other components within normal limits  CBG MONITORING, ED - Abnormal; Notable for the following components:   Glucose-Capillary 145 (*)    All other components within normal limits  POC OCCULT BLOOD, ED - Abnormal; Notable for the following components:   Fecal Occult Bld POSITIVE (*)    All other components within normal limits  TYPE AND SCREEN    EKG  EKG Interpretation  Date/Time:  Friday September 05 2017 08:38:09 EST Ventricular Rate:  98 PR Interval:    QRS Duration: 86 QT Interval:  337 QTC Calculation: 431 R Axis:   80 Text Interpretation:  Sinus rhythm Minimal ST depression, anterolateral leads Baseline wander in lead(s) V6 Confirmed by Nat Christen [id number]) on 09/05/2017 8:42:30 AM       Radiology No results found.  Procedures Procedures (including critical care time)  Medications Ordered in ED Medications  sodium chloride 0.9 % bolus 500 mL (0 mLs Intravenous Stopped 09/05/17 1231)     Initial Impression / Assessment and Plan / ED Course  I have reviewed the triage vital signs and the nursing notes.  Pertinent labs & imaging results that were available during my care of the patient were reviewed by me and considered in my medical decision making (see chart for details).     Patient has obvious frank bright red blood per rectum.  Hemoglobin has dropped 3 g to 9.4.  Suspect diverticular bleed.  IV fluids, discussion with hospitalist.  Admit.   CRITICAL CARE Performed by: Nat Christen  ?  Total critical care time: 30 minutes  Critical care time was exclusive of separately billable procedures and treating other patients.  Critical care was necessary to treat or prevent imminent or life-threatening deterioration.  Critical care was time spent personally by me on the following activities: development of treatment plan with patient and/or surrogate as well as nursing, discussions with consultants, evaluation of patient's response to treatment, examination of patient, obtaining history from patient or surrogate, ordering and performing treatments and interventions, ordering and review of laboratory studies, ordering and review of radiographic studies, pulse oximetry and re-evaluation of patient's condition.  Final Clinical Impressions(s) / ED Diagnoses   Final diagnoses:  Lower GI bleed  Anemia, unspecified type    ED Discharge Orders  None       Nat Christen,  MD 09/05/17 1331

## 2017-09-05 NOTE — Progress Notes (Signed)
Patient used the [facility] to void but no BM noted, hence urine is clear yellow.  Will monitor.

## 2017-09-06 ENCOUNTER — Inpatient Hospital Stay (HOSPITAL_COMMUNITY): Payer: Medicare HMO

## 2017-09-06 DIAGNOSIS — A419 Sepsis, unspecified organism: Principal | ICD-10-CM

## 2017-09-06 DIAGNOSIS — J181 Lobar pneumonia, unspecified organism: Secondary | ICD-10-CM

## 2017-09-06 DIAGNOSIS — J9601 Acute respiratory failure with hypoxia: Secondary | ICD-10-CM

## 2017-09-06 DIAGNOSIS — D649 Anemia, unspecified: Secondary | ICD-10-CM

## 2017-09-06 LAB — MRSA PCR SCREENING: MRSA by PCR: NEGATIVE

## 2017-09-06 LAB — COMPREHENSIVE METABOLIC PANEL
ALK PHOS: 60 U/L (ref 38–126)
ALT: 10 U/L — ABNORMAL LOW (ref 14–54)
AST: 21 U/L (ref 15–41)
Albumin: 2.3 g/dL — ABNORMAL LOW (ref 3.5–5.0)
Anion gap: 11 (ref 5–15)
BUN: 12 mg/dL (ref 6–20)
CALCIUM: 7.7 mg/dL — AB (ref 8.9–10.3)
CHLORIDE: 102 mmol/L (ref 101–111)
CO2: 20 mmol/L — AB (ref 22–32)
CREATININE: 0.63 mg/dL (ref 0.44–1.00)
GFR calc non Af Amer: >60 mL/min (ref >60)
GLUCOSE: 194 mg/dL — AB (ref 65–99)
Potassium: 3.7 mmol/L (ref 3.5–5.1)
SODIUM: 133 mmol/L — AB (ref 135–145)
Total Bilirubin: 1.1 mg/dL (ref 0.3–1.2)
Total Protein: 5.2 g/dL — ABNORMAL LOW (ref 6.5–8.1)

## 2017-09-06 LAB — CBC WITH DIFFERENTIAL/PLATELET
BASOS ABS: 0 10*3/uL (ref 0.0–0.1)
Basophils Relative: 0 %
EOS ABS: 0 10*3/uL (ref 0.0–0.7)
Eosinophils Relative: 0 %
HCT: 26.9 % — ABNORMAL LOW (ref 36.0–46.0)
HEMOGLOBIN: 8.5 g/dL — AB (ref 12.0–15.0)
LYMPHS ABS: 0.7 10*3/uL (ref 0.7–4.0)
LYMPHS PCT: 4 %
MCH: 28.8 pg (ref 26.0–34.0)
MCHC: 31.6 g/dL (ref 30.0–36.0)
MCV: 91.2 fL (ref 78.0–100.0)
Monocytes Absolute: 1.8 10*3/uL — ABNORMAL HIGH (ref 0.1–1.0)
Monocytes Relative: 11 %
NEUTROS PCT: 85 %
Neutro Abs: 14.4 10*3/uL — ABNORMAL HIGH (ref 1.7–7.7)
PLATELETS: 164 10*3/uL (ref 150–400)
RBC: 2.95 MIL/uL — ABNORMAL LOW (ref 3.87–5.11)
RDW: 16.4 % — ABNORMAL HIGH (ref 11.5–15.5)
WBC: 17 10*3/uL — AB (ref 4.0–10.5)

## 2017-09-06 LAB — BASIC METABOLIC PANEL
Anion gap: 10 (ref 5–15)
BUN: 10 mg/dL (ref 6–20)
CHLORIDE: 103 mmol/L (ref 101–111)
CO2: 21 mmol/L — AB (ref 22–32)
CREATININE: 0.57 mg/dL (ref 0.44–1.00)
Calcium: 7.8 mg/dL — ABNORMAL LOW (ref 8.9–10.3)
GFR calc Af Amer: >60 mL/min (ref >60)
GFR calc non Af Amer: >60 mL/min (ref >60)
GLUCOSE: 129 mg/dL — AB (ref 65–99)
Potassium: 3.5 mmol/L (ref 3.5–5.1)
Sodium: 134 mmol/L — ABNORMAL LOW (ref 135–145)

## 2017-09-06 LAB — URINALYSIS, ROUTINE W REFLEX MICROSCOPIC
Bilirubin Urine: NEGATIVE
GLUCOSE, UA: NEGATIVE mg/dL
HGB URINE DIPSTICK: NEGATIVE
KETONES UR: 20 mg/dL — AB
Leukocytes, UA: NEGATIVE
NITRITE: POSITIVE — AB
PROTEIN: NEGATIVE mg/dL
Specific Gravity, Urine: 1.023 (ref 1.005–1.030)
pH: 5 (ref 5.0–8.0)

## 2017-09-06 LAB — HEMOGLOBIN AND HEMATOCRIT, BLOOD
HCT: 23.9 % — ABNORMAL LOW (ref 36.0–46.0)
HCT: 25.4 % — ABNORMAL LOW (ref 36.0–46.0)
HCT: 27.6 % — ABNORMAL LOW (ref 36.0–46.0)
Hemoglobin: 7.5 g/dL — ABNORMAL LOW (ref 12.0–15.0)
Hemoglobin: 8 g/dL — ABNORMAL LOW (ref 12.0–15.0)
Hemoglobin: 8.8 g/dL — ABNORMAL LOW (ref 12.0–15.0)

## 2017-09-06 LAB — PROTIME-INR
INR: 1.3
Prothrombin Time: 16.1 seconds — ABNORMAL HIGH (ref 11.4–15.2)

## 2017-09-06 LAB — APTT: APTT: 34 s (ref 24–36)

## 2017-09-06 LAB — STREP PNEUMONIAE URINARY ANTIGEN: STREP PNEUMO URINARY ANTIGEN: NEGATIVE

## 2017-09-06 LAB — INFLUENZA PANEL BY PCR (TYPE A & B)
Influenza A By PCR: NEGATIVE
Influenza B By PCR: NEGATIVE

## 2017-09-06 LAB — LACTIC ACID, PLASMA
Lactic Acid, Venous: 2.1 mmol/L (ref 0.5–1.9)
Lactic Acid, Venous: 1.8 mmol/L (ref 0.5–1.9)

## 2017-09-06 LAB — PROCALCITONIN: PROCALCITONIN: 2.13 ng/mL

## 2017-09-06 MED ORDER — LEVOFLOXACIN IN D5W 500 MG/100ML IV SOLN: 500.0000 mg | INTRAVENOUS | Status: DC

## 2017-09-06 MED ORDER — VANCOMYCIN HCL IN DEXTROSE 750-5 MG/150ML-% IV SOLN: 750.0000 mg | Freq: Two times a day (BID) | INTRAVENOUS | Status: DC

## 2017-09-06 MED ORDER — SODIUM CHLORIDE 0.9 % IV SOLN
500.0000 mg | INTRAVENOUS | Status: DC
  Administered 2017-09-07 – 2017-09-11 (×5): 500 mg via INTRAVENOUS
  Filled 2017-09-06 (×5): qty 500

## 2017-09-06 MED ORDER — VANCOMYCIN HCL 10 G IV SOLR
1500.0000 mg | Freq: Once | INTRAVENOUS | Status: AC
  Administered 2017-09-06: 1500 mg via INTRAVENOUS
  Filled 2017-09-06: qty 1500

## 2017-09-06 MED ORDER — SODIUM CHLORIDE 0.9 % IV SOLN
1.5000 g | Freq: Four times a day (QID) | INTRAVENOUS | Status: DC
  Administered 2017-09-06 – 2017-09-09 (×11): 1.5 g via INTRAVENOUS
  Filled 2017-09-06 (×14): qty 1.5

## 2017-09-06 MED ORDER — LEVOFLOXACIN IN D5W 750 MG/150ML IV SOLN
750.0000 mg | Freq: Once | INTRAVENOUS | Status: AC
  Administered 2017-09-06: 750 mg via INTRAVENOUS
  Filled 2017-09-06: qty 150

## 2017-09-06 MED ORDER — SODIUM CHLORIDE 0.9 % IV SOLN
2.0000 g | Freq: Once | INTRAVENOUS | Status: AC
  Administered 2017-09-06: 2 g via INTRAVENOUS
  Filled 2017-09-06: qty 2

## 2017-09-06 MED ORDER — SODIUM CHLORIDE 0.9 % IV SOLN: INTRAVENOUS | Status: DC

## 2017-09-06 MED ORDER — SODIUM CHLORIDE 0.9 % IV SOLN
2.0000 g | Freq: Three times a day (TID) | INTRAVENOUS | Status: DC
  Filled 2017-09-06: qty 2

## 2017-09-06 NOTE — Progress Notes (Signed)
PROGRESS NOTE  [name]  FIE:[id number] DOB: December 11, 1940 DOA: 09/05/2017 PCP: [name], MD  Brief Narrative:   The patient is a 77 year old female with history of anxiety, depression, GERD, smoking, arthritis, and diverticulosis.  She has a history of multiple episodes of diverticular bleeding including most recently in April 2018.  She presented to the emergency department a couple days prior to admission after a fall resulted in head trauma however head CT was negative.  At the time she was otherwise feeling well was discharged to home from the ER.  Over the subsequent days she had headache and felt fatigued.  She presented to the emergency department on 3/8 after she developed bright red blood per rectum and had 6-7 bloody bowel movements at home.  In the emergency department, she was found to have had a drop in her hemoglobin from 12.9 g/dL to 9.4 g/dL and she had active bleeding in the ER.  She was started on IV fluids and gastroenterology was consulted.  Overnight from 3/8-3/9 she spiked a fever to 102.6 Fahrenheit and became tachycardic.  She met criteria for sepsis and sepsis protocol was initiated.  She was started on empiric antibiotics which were narrowed after chest x-ray revealed that she has a large left lower lobe pneumonia.  Assessment & Plan:  Sepsis (fever, leukocytosis, tachycardia, tachypnea, elevated lactic acid)  and acute hypoxic respiratory failure secondary to left lower lobe pneumonia -Unasyn and azithromycin -Prednisone 31m daily x 5 days as adjunct -Wean oxygen as tolerated -Continuous pulse oximetry -Follow-up blood cultures however according to the nurse these were drawn after initiation of antibiotics -Sputum culture if able -Repeat lactic acid is now normal after IV fluids  Acute blood loss anemia secondary to diverticular hemorrhage -Hemoglobin has trended down to 8 g/dL -Continue to cycle H&H and transfuse to keep hemoglobin greater than 7 -If  hemorrhaging worsens, consider bleeding scan with IR embolization as needed  Hyperlipidemia, Stable, continue simvastatin  Glaucoma with macular degeneration, stable, continued eyedrops  Hypertension, blood pressure low normal -Hold parameters placed on propranolol  DVT prophylaxis: SCDs Code Status: Full code verified with patient.  She would want intubation, BiPAP if needed to treat this pneumonia. Family Communication: Husband was at bedside.  The patient has a son MKerston Landeckphone number [phone] is a sPsychologist, sport and exercisein [place]  He would like daily updates if possible. Disposition Plan: Patient ambulated with walker sometimes prior to admission she had a fall a few days ago.  When she is feeling better she will need a PT evaluation to determine if she needs rehab prior to going home.   Consultants:   None  Procedures:  None  Antimicrobials:  Anti-infectives (From admission, onward)   Start     Dose/Rate Route Frequency Ordered Stop   09/07/17 0800  levofloxacin (LEVAQUIN) IVPB 500 mg  Status:  Discontinued     500 mg 100 mL/hr over 60 Minutes Intravenous Every 24 hours 09/06/17 0748 09/06/17 1238   09/07/17 0800  azithromycin (ZITHROMAX) 500 mg in sodium chloride 0.9 % 250 mL IVPB     500 mg 250 mL/hr over 60 Minutes Intravenous Every 24 hours 09/06/17 1238     09/06/17 2200  vancomycin (VANCOCIN) IVPB 750 mg/150 ml premix  Status:  Discontinued     750 mg 150 mL/hr over 60 Minutes Intravenous Every 12 hours 09/06/17 0748 09/06/17 1136   09/06/17 1600  ampicillin-sulbactam (UNASYN) 1.5 g in sodium chloride 0.9 % 100  mL IVPB     1.5 g 200 mL/hr over 30 Minutes Intravenous Every 6 hours 09/06/17 1340     09/06/17 1400  aztreonam (AZACTAM) 2 g in sodium chloride 0.9 % 100 mL IVPB  Status:  Discontinued     2 g 200 mL/hr over 30 Minutes Intravenous Every 8 hours 09/06/17 0748 09/06/17 1136   09/06/17 0800  levofloxacin (LEVAQUIN) IVPB 750 mg     750 mg 100  mL/hr over 90 Minutes Intravenous  Once 09/06/17 0723 09/06/17 1000   09/06/17 0730  aztreonam (AZACTAM) 2 g in sodium chloride 0.9 % 100 mL IVPB     2 g 200 mL/hr over 30 Minutes Intravenous  Once 09/06/17 0723 09/06/17 0850   09/06/17 0730  vancomycin (VANCOCIN) 1,500 mg in sodium chloride 0.9 % 500 mL IVPB     1,500 mg 250 mL/hr over 120 Minutes Intravenous  Once 09/06/17 0723 09/06/17 1309       Subjective:  Previously had not felt [name] of breath or had a cough but is feeling somewhat [name] of breath at the time of my interview.  She denies any chest pressure or chest pains, nausea.  She feels that her stools have decreased in frequency.  Prior to admission she did not have any fevers or chills but she has been having these symptoms since last evening.  Objective: Vitals:   09/06/17 0619 09/06/17 1101 09/06/17 1300 09/06/17 1628  BP:  (!) 117/56 (!) 100/54 (!) 111/53  Pulse: (!) 125 (!) 123 100   Resp: 20 (!) 28 19 (!) 28  Temp: 100.3 F (37.9 C) 99.3 F (37.4 C) 98.9 F (37.2 C) 98.6 F (37 C)  TempSrc: Oral Oral Oral Oral  SpO2: 95% 96% 95%   Weight:      Height:        Intake/Output Summary (Last 24 hours) at 09/06/2017 1700 Last data filed at 09/06/2017 1500 Gross per 24 hour  Intake 2005.42 ml  Output 550 ml  Net 1455.42 ml   Filed Weights   09/05/17 0838 09/05/17 1540  Weight: 82.6 kg (182 lb) 84 kg (185 lb 3 oz)    Examination:  General exam:  Adult female.  Mild respiratory distress with intermittent SCM retractions, tachypnea.  HEENT:  NCAT, MMM Respiratory system: Very diminished breath sounds heard on the left anterior chest, left base, left mid back with some faint rales in a similar distribution, no wheezes or rhonchi Cardiovascular system: Tachycardic, regular rhythm, normal S1/S2. No murmurs, rubs, gallops or clicks.  Warm extremities Gastrointestinal system: Hyperactive bowel sounds, soft, nondistended, nontender. MSK:  Normal tone and bulk, no  lower extremity edema Neuro:  Grossly intact    Data Reviewed: I have personally reviewed following labs and imaging studies  CBC: Recent Labs  Lab 09/05/17 0858 09/05/17 1554 09/05/17 2209 09/06/17 0406 09/06/17 0950 09/06/17 1605  WBC 12.9*  --   --   --  17.0*  --   NEUTROABS 10.6*  --   --   --  14.4*  --   HGB 9.4* 9.3* 9.8* 8.8* 8.5* 8.0*  HCT 28.5* 29.3* 30.3* 27.6* 26.9* 25.4*  MCV 91.3  --   --   --  91.2  --   PLT 144*  --   --   --  164  --    Basic Metabolic Panel: Recent Labs  Lab 09/05/17 0858 09/06/17 0406 09/06/17 0950  NA 136 134* 133*  K 4.4 3.5 3.7  CL  105 103 102  CO2 22 21* 20*  GLUCOSE 145* 129* 194*  BUN '15 10 12  ' CREATININE 0.64 0.57 0.63  CALCIUM 8.1* 7.8* 7.7*   GFR: Estimated Creatinine Clearance: 58.3 mL/min (by C-G formula based on SCr of 0.63 mg/dL). Liver Function Tests: Recent Labs  Lab 09/05/17 0858 09/06/17 0950  AST 23 21  ALT 11* 10*  ALKPHOS 56 60  BILITOT 1.3* 1.1  PROT 5.5* 5.2*  ALBUMIN 2.8* 2.3*   No results for input(s): LIPASE, AMYLASE in the last 168 hours. No results for input(s): AMMONIA in the last 168 hours. Coagulation Profile: Recent Labs  Lab 09/06/17 0950  INR 1.30   Cardiac Enzymes: No results for input(s): CKTOTAL, CKMB, CKMBINDEX, TROPONINI in the last 168 hours. BNP (last 3 results) No results for input(s): PROBNP in the last 8760 hours. HbA1C: No results for input(s): HGBA1C in the last 72 hours. CBG: Recent Labs  Lab 09/05/17 0847  GLUCAP 145*   Lipid Profile: No results for input(s): CHOL, HDL, LDLCALC, TRIG, CHOLHDL, LDLDIRECT in the last 72 hours. Thyroid Function Tests: No results for input(s): TSH, T4TOTAL, FREET4, T3FREE, THYROIDAB in the last 72 hours. Anemia Panel: No results for input(s): VITAMINB12, FOLATE, FERRITIN, TIBC, IRON, RETICCTPCT in the last 72 hours. Urine analysis:    Component Value Date/Time   COLORURINE YELLOW 09/06/2017 0721   APPEARANCEUR HAZY (A)  09/06/2017 0721   LABSPEC 1.023 09/06/2017 0721   PHURINE 5.0 09/06/2017 0721   GLUCOSEU NEGATIVE 09/06/2017 0721   HGBUR NEGATIVE 09/06/2017 0721   BILIRUBINUR NEGATIVE 09/06/2017 0721   KETONESUR 20 (A) 09/06/2017 0721   PROTEINUR NEGATIVE 09/06/2017 0721   UROBILINOGEN 0.2 10/20/2007 2015   NITRITE POSITIVE (A) 09/06/2017 0721   LEUKOCYTESUR NEGATIVE 09/06/2017 0721   Sepsis Labs: '@LABRCNTIP' (procalcitonin:4,lacticidven:4)  ) Recent Results (from the past 240 hour(s))  MRSA PCR Screening     Status: None   Collection Time: 09/06/17 11:58 AM  Result Value Ref Range Status   MRSA by PCR NEGATIVE NEGATIVE Final    Comment:        The GeneXpert MRSA Assay (FDA approved for NASAL specimens only), is one component of a comprehensive MRSA colonization surveillance program. It is not intended to diagnose MRSA infection nor to guide or monitor treatment for MRSA infections. Performed at [facility], [place] [address] [place], [place] [address]       Radiology Studies: Dg Chest Port 1 View  Result Date: 09/06/2017 CLINICAL DATA:  Fever EXAM: PORTABLE CHEST 1 VIEW COMPARISON:  08/07/2017 FINDINGS: Consolidation in the left lower lobe compatible with pneumonia. Possible small left effusion. Right lung clear. Heart is upper limits normal in size. IMPRESSION: Left lower lobe pneumonia.  Possible small left effusion. Electronically Signed   By: Rolm Baptise M.D.   On: 09/06/2017 07:54     Scheduled Meds: . acidophilus  1 capsule Oral Daily  . albuterol  3 mL Inhalation BID  . cholecalciferol  2,000 Units Oral BID  . dorzolamide-timolol  1 drop Both Eyes BID  . fluticasone  2 spray Each Nare Daily  . loratadine  10 mg Oral Daily  . multivitamin with minerals  1 tablet Oral Daily  . pantoprazole  40 mg Oral Daily  . polyethylene glycol  17 g Oral Daily  . propranolol  20 mg Oral Daily  . sertraline  100 mg Oral Daily  . simvastatin  20 mg Oral Daily  . sodium chloride  flush  3  mL Intravenous Q12H  . vitamin C  1,000 mg Oral Daily   Continuous Infusions: . sodium chloride    . 0.9 % NaCl with KCl 20 mEq / L 100 mL/hr at 09/06/17 1256  . ampicillin-sulbactam (UNASYN) IV 1.5 g (09/06/17 1635)  . [START ON 09/07/2017] azithromycin       LOS: 1 day    Time spent: 30 min    Janece Canterbury, MD [facility] Pager [phone]  If 7PM-7AM, please contact night-coverage [email or web address] Password [email or web address] 09/06/2017, 5:00 PM

## 2017-09-06 NOTE — Progress Notes (Signed)
Pharmacy Antibiotic Note  [name] is a 77 y.o. female admitted on 09/05/2017 with diverticulitis and bleeding, now w/ concern for sepsis.  Pharmacy has been consulted for Vancocin, Levaquin, and Azactam dosing.  Plan: Vancomycin 1500mg  x1 then 750mg  IV every 12 hours.  Goal trough 15-20 mcg/mL.  Levaquin 750mg  IV x1 then 500mg  IV every 24 hours. Azactam 2g IV every 8 hours.  Height: 5\' 2"  (157.5 cm) Weight: 185 lb 3 oz (84 kg) IBW/kg (Calculated) : 50.1  Temp (24hrs), Avg:100.3 F (37.9 C), Min:98.2 F (36.8 C), Max:102.6 F (39.2 C)  Recent Labs  Lab 09/05/17 0858 09/06/17 0406  WBC 12.9*  --   CREATININE 0.64 0.57    Estimated Creatinine Clearance: 58.3 mL/min (by C-G formula based on SCr of 0.57 mg/dL).    Allergies  Allergen Reactions  . Morphine And Related Itching  . Cefadroxil Hives    Patient can take amoxicillin and cipro    Thank you for allowing pharmacy to be a part of this patient's care.  Wynona Neat, PharmD, BCPS  09/06/2017 7:40 AM

## 2017-09-06 NOTE — Progress Notes (Signed)
CRITICAL VALUE ALERT  Critical Value:  Lactic acid 2.1  Date & Time Notied:  03/09 @1058   Provider Notified: Dr Sheran Fava  Orders Received/Actions taken: Waiting for further orders

## 2017-09-06 NOTE — Progress Notes (Signed)
ANTIBIOTIC CONSULT NOTE - INITIAL  Pharmacy Consult for Unasyn Indication: pneumonia  Allergies  Allergen Reactions  . Morphine And Related Itching  . Cefadroxil Hives    Patient can take amoxicillin and cipro    Patient Measurements: Height: 5\' 2"  (157.5 cm) Weight: 185 lb 3 oz (84 kg) IBW/kg (Calculated) : 50.1 Adjusted Body Weight:    Vital Signs: Temp: 98.9 F (37.2 C) (03/09 1300) Temp Source: Oral (03/09 1300) BP: 100/54 (03/09 1300) Pulse Rate: 100 (03/09 1300) Intake/Output from previous day: 03/08 0701 - 03/09 0700 In: 1298.8 [P.O.:300; I.V.:998.8] Out: 400 [Urine:200; Stool:200] Intake/Output from this shift: No intake/output data recorded.  Labs: Recent Labs    09/05/17 0858  09/05/17 2209 09/06/17 0406 09/06/17 0950  WBC 12.9*  --   --   --  17.0*  HGB 9.4*   < > 9.8* 8.8* 8.5*  PLT 144*  --   --   --  164  CREATININE 0.64  --   --  0.57 0.63   < > = values in this interval not displayed.   Estimated Creatinine Clearance: 58.3 mL/min (by C-G formula based on SCr of 0.63 mg/dL). No results for input(s): VANCOTROUGH, VANCOPEAK, VANCORANDOM, GENTTROUGH, GENTPEAK, GENTRANDOM, TOBRATROUGH, TOBRAPEAK, TOBRARND, AMIKACINPEAK, AMIKACINTROU, AMIKACIN in the last 72 hours.   Microbiology: Recent Results (from the past 720 hour(s))  MRSA PCR Screening     Status: None   Collection Time: 09/06/17 11:58 AM  Result Value Ref Range Status   MRSA by PCR NEGATIVE NEGATIVE Final    Comment:        The GeneXpert MRSA Assay (FDA approved for NASAL specimens only), is one component of a comprehensive MRSA colonization surveillance program. It is not intended to diagnose MRSA infection nor to guide or monitor treatment for MRSA infections. Performed at [facility], [place] [address] [place], [place] [address]     Medical History: Past Medical History:  Diagnosis Date  . Anxiety   . Arthritis    "bilateral knees, shoulders, elbows; neck, pretty  widespread" (09/05/2017)  . BPPV (benign paroxysmal positional vertigo)   . Depression   . GERD (gastroesophageal reflux disease)   . Glaucoma, both eyes   . Headache    "probably 2/month" (09/05/2017)  . History of blood transfusion ~ 2008   "related to LGIB"  . Hyperlipemia   . Lower GI bleeding ~ 2008; 09/05/2017   "had to have blood transfusion"  . Macular degeneration, bilateral   . Osteopenia   . Seasonal allergies   . Skin cancer, basal cell 2001   "off my nose, left side"  . Sleeping excessive   . Tinnitus of both ears     Assessment:  [name] is a 77 y.o. female admitted on 09/05/2017 with diverticulitis and bleeding, now w/ concern for sepsis. CXR with PNA.  PMH: anxiety, arthritis, vertigo, skin cancer 01, depression, GERD, glaucoma, HLD, diverticular bleeds x 3 per patient, macular degeneration, osteopenia, seasonal allergies, lethargy, tinnitus R ear.  ID: CXR with LLL PNA. Tmax 102.6. Currently afebrile.WBC 12.9>17 overnight. LA 2.1, PC 2.13 both elevated  Levaquin 3/9>> Unasyn 3/9>> Aztreonam 3/9 x 1 Vanco 3/9 x 1  Goal of Therapy:  Eradication of infection  Plan:  D/c Vanco/Aztreonam/Levaquin Unasyn 1.5g IV q 6 hrs Continue Azithro 500mg  IV q 24h.   [name], PharmD, BCPS Clinical Staff Pharmacist Pager [phone]  [name] 09/06/2017,1:36 PM

## 2017-09-06 NOTE — Progress Notes (Signed)
[name] 11:58 AM  Subjective: Patient seen and examined and case discussed with her husband as well as my partner Dr. Raliegh Ip and she's had no further bleeding here in the hospital and she tells me this is her third episode and we also discussed her recent fall and her last colonoscopy was roughly 10 years ago and her last diverticular bleed was probably 2009 and she is not on any aspirin or blood thinners and has no other specific complaints  Objective: Vital signs stable afebrile no acute distress hemoglobin very slow drift down abdomen is soft nontender good bowel sounds  Assessment: Diverticular bleeding currently stable  Plan: Clear liquids today and if no signs of bleeding may advance diet tomorrow and hopefully she can go home soon and any further neurologic workup from her fall per primary team  [facility] E  Pager [phone] After 5PM or if no answer call [phone]

## 2017-09-06 NOTE — Progress Notes (Signed)
Pt had a bowel movement.  Small stool with blood.

## 2017-09-07 LAB — HIV ANTIBODY (ROUTINE TESTING W REFLEX): HIV SCREEN 4TH GENERATION: NONREACTIVE

## 2017-09-07 LAB — CBC
HCT: 23.6 % — ABNORMAL LOW (ref 36.0–46.0)
Hemoglobin: 7.4 g/dL — ABNORMAL LOW (ref 12.0–15.0)
MCH: 29.1 pg (ref 26.0–34.0)
MCHC: 31.4 g/dL (ref 30.0–36.0)
MCV: 92.9 fL (ref 78.0–100.0)
PLATELETS: 138 10*3/uL — AB (ref 150–400)
RBC: 2.54 MIL/uL — ABNORMAL LOW (ref 3.87–5.11)
RDW: 16.9 % — AB (ref 11.5–15.5)
WBC: 14 10*3/uL — AB (ref 4.0–10.5)

## 2017-09-07 LAB — URINE CULTURE

## 2017-09-07 LAB — BASIC METABOLIC PANEL
Anion gap: 8 (ref 5–15)
BUN: 13 mg/dL (ref 6–20)
CO2: 21 mmol/L — ABNORMAL LOW (ref 22–32)
CREATININE: 0.48 mg/dL (ref 0.44–1.00)
Calcium: 7.5 mg/dL — ABNORMAL LOW (ref 8.9–10.3)
Chloride: 108 mmol/L (ref 101–111)
GFR calc Af Amer: >60 mL/min (ref >60)
Glucose, Bld: 96 mg/dL (ref 65–99)
Potassium: 3.6 mmol/L (ref 3.5–5.1)
Sodium: 137 mmol/L (ref 135–145)

## 2017-09-07 LAB — HEMOGLOBIN AND HEMATOCRIT, BLOOD
HEMATOCRIT: 25.2 % — AB (ref 36.0–46.0)
Hemoglobin: 7.8 g/dL — ABNORMAL LOW (ref 12.0–15.0)

## 2017-09-07 MED ORDER — FERUMOXYTOL INJECTION 510 MG/17 ML
510.0000 mg | Freq: Once | INTRAVENOUS | Status: AC
  Administered 2017-09-07: 510 mg via INTRAVENOUS
  Filled 2017-09-07: qty 17

## 2017-09-07 NOTE — Progress Notes (Signed)
PROGRESS NOTE  [name]  YQM:[id number] DOB: 02-13-1941 DOA: 09/05/2017 PCP: [name], MD  Brief Narrative:   The patient is a 77 year old female with history of anxiety, depression, GERD, smoking, arthritis, and diverticulosis.  She has a history of multiple episodes of diverticular bleeding including most recently in April 2018.  She presented to the emergency department a couple days prior to admission after a fall resulted in head trauma however head CT was negative.  At the time she was otherwise feeling well was discharged to home from the ER.  Over the subsequent days she had headache and felt fatigued.  She presented to the emergency department on 3/8 after she developed bright red blood per rectum and had 6-7 bloody bowel movements at home.  In the emergency department, she was found to have had a drop in her hemoglobin from 12.9 g/dL to 9.4 g/dL and she had active bleeding in the ER.  She was started on IV fluids and gastroenterology was consulted.  Overnight from 3/8-3/9 she spiked a fever to 102.6 Fahrenheit and became tachycardic.  She met criteria for sepsis and sepsis protocol was initiated.  She was started on empiric antibiotics which were narrowed after chest x-ray revealed that she has a large left lower lobe pneumonia.  Assessment & Plan:  Sepsis (fever, leukocytosis, tachycardia, tachypnea, elevated lactic acid)  and acute hypoxic respiratory failure secondary to left lower lobe pneumonia - Continue Unasyn and azithromycin -Prednisone 12m daily x 5 days, day 2 -Wean oxygen as tolerated -Follow-up blood cultures however according to the nurse these were drawn after initiation of antibiotics -Sputum culture if able  Sinus tachycardia, related to sepsis vs. Symptomatic anemia  Acute blood loss anemia secondary to diverticular hemorrhage, bleeding appears to be slowing and decrease in hemoglobin may be due to IVF -Hemoglobin has trended down to 7.4 g/dL -H&H at  1400 -If hemorrhaging worsens, consider bleeding scan with IR embolization as needed  Hyperlipidemia, Stable, continue simvastatin  Glaucoma with macular degeneration, stable, continued eyedrops  Hypertension, blood pressure low normal -Hold parameters placed on propranolol  DVT prophylaxis: SCDs Code Status: Full code verified with patient.  She would want intubation, BiPAP if needed to treat this pneumonia. Family Communication:  No family at bedside.  The patient has a son MChriss Redelphone number [phone] is a sPsychologist, sport and [place]  He would like daily updates if possible. Disposition Plan: Patient ambulated with walker sometimes prior to admission she had a fall a few days ago.  When she is feeling better she will need a PT evaluation to determine if she needs rehab prior to going home.   Consultants:   None  Procedures:  None  Antimicrobials:  Anti-infectives (From admission, onward)   Start     Dose/Rate Route Frequency Ordered Stop   09/07/17 0800  levofloxacin (LEVAQUIN) IVPB 500 mg  Status:  Discontinued     500 mg 100 mL/hr over 60 Minutes Intravenous Every 24 hours 09/06/17 0748 09/06/17 1238   09/07/17 0800  azithromycin (ZITHROMAX) 500 mg in sodium chloride 0.9 % 250 mL IVPB     500 mg 250 mL/hr over 60 Minutes Intravenous Every 24 hours 09/06/17 1238     09/06/17 2200  vancomycin (VANCOCIN) IVPB 750 mg/150 ml premix  Status:  Discontinued     750 mg 150 mL/hr over 60 Minutes Intravenous Every 12 hours 09/06/17 0748 09/06/17 1136   09/06/17 1600  ampicillin-sulbactam (UNASYN) 1.5 g in  sodium chloride 0.9 % 100 mL IVPB     1.5 g 200 mL/hr over 30 Minutes Intravenous Every 6 hours 09/06/17 1340     09/06/17 1400  aztreonam (AZACTAM) 2 g in sodium chloride 0.9 % 100 mL IVPB  Status:  Discontinued     2 g 200 mL/hr over 30 Minutes Intravenous Every 8 hours 09/06/17 0748 09/06/17 1136   09/06/17 0800  levofloxacin (LEVAQUIN) IVPB 750 mg      750 mg 100 mL/hr over 90 Minutes Intravenous  Once 09/06/17 0723 09/06/17 1000   09/06/17 0730  aztreonam (AZACTAM) 2 g in sodium chloride 0.9 % 100 mL IVPB     2 g 200 mL/hr over 30 Minutes Intravenous  Once 09/06/17 0723 09/06/17 0850   09/06/17 0730  vancomycin (VANCOCIN) 1,500 mg in sodium chloride 0.9 % 500 mL IVPB     1,500 mg 250 mL/hr over 120 Minutes Intravenous  Once 09/06/17 0723 09/06/17 1309       Subjective:  Having some more cough today.  Still feels short of breath at rest but may be a little better than yesterday.  Her strength is a little better today compared to yesterday.  She did not observe her last bowel movement.  According to records, her last bowel movement was mixed brown, blood, and black.  Nurse reported a few hours later that she had a watery brown stool.  Objective: Vitals:   09/06/17 2052 09/07/17 0436 09/07/17 0825 09/07/17 1414  BP:  (!) 113/55  (!) 112/52  Pulse:  93  (!) 111  Resp:  (!) 24  (!) 21  Temp:  98.4 F (36.9 C)  98.2 F (36.8 C)  TempSrc:  Oral  Oral  SpO2: 97% 98% 98% 96%  Weight:      Height:        Intake/Output Summary (Last 24 hours) at 09/07/2017 1422 Last data filed at 09/07/2017 1400 Gross per 24 hour  Intake 2516.67 ml  Output 1550 ml  Net 966.67 ml   Filed Weights   09/05/17 0838 09/05/17 1540  Weight: 82.6 kg (182 lb) 84 kg (185 lb 3 oz)    Examination:  General exam:  Adult female.  Mild tachypnea at rest with rare SCM retractions HEENT:  NCAT, MMM Respiratory system: Persistently diminished in the left anterior chest, left base, left mid back, no rhonchi.  Wheezing today.  No focal rales  Cardiovascular system: Tachycardic, regular rhythm, normal S1/S2. No murmurs, rubs, gallops or clicks.  Warm extremities Gastrointestinal system: Normal active bowel sounds, soft, nondistended, nontender. MSK:  Normal tone and bulk, no lower extremity edema Neuro:  Grossly intact   Data Reviewed: I have personally  reviewed following labs and imaging studies  CBC: Recent Labs  Lab 09/05/17 0858  09/06/17 0406 09/06/17 0950 09/06/17 1605 09/06/17 2332 09/07/17 0455  WBC 12.9*  --   --  17.0*  --   --  14.0*  NEUTROABS 10.6*  --   --  14.4*  --   --   --   HGB 9.4*   < > 8.8* 8.5* 8.0* 7.5* 7.4*  HCT 28.5*   < > 27.6* 26.9* 25.4* 23.9* 23.6*  MCV 91.3  --   --  91.2  --   --  92.9  PLT 144*  --   --  164  --   --  138*   < > = values in this interval not displayed.   Basic Metabolic Panel: Recent Labs  Lab 09/05/17 0858 09/06/17 0406 09/06/17 0950 09/07/17 0455  NA 136 134* 133* 137  K 4.4 3.5 3.7 3.6  CL 105 103 102 108  CO2 22 21* 20* 21*  GLUCOSE 145* 129* 194* 96  BUN _0 CREATININE 0.64 0.57 0.63 0.48  CALCIUM 8.1* 7.8* 7.7* 7.5*   GFR: Estimated Creatinine Clearance: 58.3 mL/min (by C-G formula based on SCr of 0.48 mg/dL). Liver Function Tests: Recent Labs  Lab 09/05/17 0858 09/06/17 0950  AST 23 21  ALT 11* 10*  ALKPHOS 56 60  BILITOT 1.3* 1.1  PROT 5.5* 5.2*  ALBUMIN 2.8* 2.3*   No results for input(s): LIPASE, AMYLASE in the last 168 hours. No results for input(s): AMMONIA in the last 168 hours. Coagulation Profile: Recent Labs  Lab 09/06/17 0950  INR 1.30   Cardiac Enzymes: No results for input(s): CKTOTAL, CKMB, CKMBINDEX, TROPONINI in the last 168 hours. BNP (last 3 results) No results for input(s): PROBNP in the last 8760 hours. HbA1C: No results for input(s): HGBA1C in the last 72 hours. CBG: Recent Labs  Lab 09/05/17 0847  GLUCAP 145*   Lipid Profile: No results for input(s): CHOL, HDL, LDLCALC, TRIG, CHOLHDL, LDLDIRECT in the last 72 hours. Thyroid Function Tests: No results for input(s): TSH, T4TOTAL, FREET4, T3FREE, THYROIDAB in the last 72 hours. Anemia Panel: No results for input(s): VITAMINB12, FOLATE, FERRITIN, TIBC, IRON, RETICCTPCT in the last 72 hours. Urine analysis:    Component Value Date/Time   COLORURINE YELLOW  09/06/2017 0721   APPEARANCEUR HAZY (A) 09/06/2017 0721   LABSPEC 1.023 09/06/2017 0721   PHURINE 5.0 09/06/2017 0721   GLUCOSEU NEGATIVE 09/06/2017 0721   HGBUR NEGATIVE 09/06/2017 0721   BILIRUBINUR NEGATIVE 09/06/2017 0721   KETONESUR 20 (A) 09/06/2017 0721   PROTEINUR NEGATIVE 09/06/2017 0721   UROBILINOGEN 0.2 10/20/2007 2015   NITRITE POSITIVE (A) 09/06/2017 0721   LEUKOCYTESUR NEGATIVE 09/06/2017 0721   Sepsis Labs: _1 (procalcitonin:4,lacticidven:4)  ) Recent Results (from the past 240 hour(s))  Culture, Urine     Status: Abnormal   Collection Time: 09/06/17  9:34 AM  Result Value Ref Range Status   Specimen Description URINE, CLEAN CATCH  Final   Special Requests   Final    NONE Performed at [facility], [place] [address], [place], [place] [address]    Culture MULTIPLE SPECIES PRESENT, SUGGEST RECOLLECTION (A)  Final   Report Status 09/07/2017 FINAL  Final  MRSA PCR Screening     Status: None   Collection Time: 09/06/17 11:58 AM  Result Value Ref Range Status   MRSA by PCR NEGATIVE NEGATIVE Final    Comment:        The GeneXpert MRSA Assay (FDA approved for NASAL specimens only), is one component of a comprehensive MRSA colonization surveillance program. It is not intended to diagnose MRSA infection nor to guide or monitor treatment for MRSA infections. Performed at [facility], [place] [address], [place], [place] [address]       Radiology Studies: Dg Chest Port 1 View  Result Date: 09/06/2017 CLINICAL DATA:  Fever EXAM: PORTABLE CHEST 1 VIEW COMPARISON:  08/07/2017 FINDINGS: Consolidation in the left lower lobe compatible with pneumonia. Possible small left effusion. Right lung clear. Heart is upper limits normal in size. IMPRESSION: Left lower lobe pneumonia.  Possible small left effusion. Electronically Signed   By: Rolm Baptise M.D.   On: 09/06/2017 07:54     Scheduled Meds: . acidophilus  1 capsule Oral Daily  . albuterol  3 mL  Inhalation BID  . cholecalciferol  2,000 Units Oral BID  . dorzolamide-timolol  1 drop Both Eyes BID  . fluticasone  2 spray Each Nare Daily  . loratadine  10 mg Oral Daily  . multivitamin with minerals  1 tablet Oral Daily  . pantoprazole  40 mg Oral Daily  . polyethylene glycol  17 g Oral Daily  . propranolol  20 mg Oral Daily  . sertraline  100 mg Oral Daily  . simvastatin  20 mg Oral Daily  . sodium chloride flush  3 mL Intravenous Q12H  . vitamin C  1,000 mg Oral Daily   Continuous Infusions: . sodium chloride    . ampicillin-sulbactam (UNASYN) IV Stopped (09/07/17 1052)  . azithromycin Stopped (09/07/17 0919)     LOS: 2 days    Time spent: 30 min    Janece Canterbury, MD [facility] Pager [phone]  If 7PM-7AM, please contact night-coverage [email or web address] Password [email or web address] 09/07/2017, 2:22 PM

## 2017-09-07 NOTE — Progress Notes (Signed)
[name] 11:18 AM  Subjective: Patient with one dark stool this morning but feels okay otherwise and tolerating clear liquids and has no new complaints and no obvious signs of bleeding yesterday after I saw her and again she denies aspirin nonsteroidals and blood thinners at home  Objective: Vital signs stable afebrile no acute distress abdomen is soft nontender hemoglobin stable BUN okay  Assessment: Probable diverticular bleeding  Plan: One more day of clear liquids and probably advance diet tomorrow and if no signs of bleeding hopefully home soon  [facility]  Pager [phone] After 5PM or if no answer call [phone]

## 2017-09-08 LAB — BASIC METABOLIC PANEL
Anion gap: 6 (ref 5–15)
BUN: 6 mg/dL (ref 6–20)
CALCIUM: 7.6 mg/dL — AB (ref 8.9–10.3)
CO2: 25 mmol/L (ref 22–32)
CREATININE: 0.51 mg/dL (ref 0.44–1.00)
Chloride: 107 mmol/L (ref 101–111)
Glucose, Bld: 113 mg/dL — ABNORMAL HIGH (ref 65–99)
Potassium: 2.9 mmol/L — ABNORMAL LOW (ref 3.5–5.1)
Sodium: 138 mmol/L (ref 135–145)

## 2017-09-08 LAB — PREPARE RBC (CROSSMATCH)

## 2017-09-08 LAB — CBC
HCT: 23.3 % — ABNORMAL LOW (ref 36.0–46.0)
Hemoglobin: 7.2 g/dL — ABNORMAL LOW (ref 12.0–15.0)
MCH: 28.6 pg (ref 26.0–34.0)
MCHC: 30.9 g/dL (ref 30.0–36.0)
MCV: 92.5 fL (ref 78.0–100.0)
PLATELETS: 162 10*3/uL (ref 150–400)
RBC: 2.52 MIL/uL — AB (ref 3.87–5.11)
RDW: 16.6 % — ABNORMAL HIGH (ref 11.5–15.5)
WBC: 13.8 10*3/uL — ABNORMAL HIGH (ref 4.0–10.5)

## 2017-09-08 LAB — MAGNESIUM: Magnesium: 2.2 mg/dL (ref 1.7–2.4)

## 2017-09-08 MED ORDER — SODIUM CHLORIDE 0.9 % IV SOLN
Freq: Once | INTRAVENOUS | Status: AC
Start: 2017-09-08 — End: 2017-09-08
  Administered 2017-09-08: 11:00:00 via INTRAVENOUS

## 2017-09-08 MED ORDER — POTASSIUM CHLORIDE CRYS ER 20 MEQ PO TBCR
40.0000 meq | EXTENDED_RELEASE_TABLET | ORAL | Status: AC
  Administered 2017-09-08 (×3): 40 meq via ORAL
  Filled 2017-09-08 (×3): qty 2

## 2017-09-08 NOTE — NC FL2 (Addendum)
[place] LEVEL OF CARE SCREENING TOOL     IDENTIFICATION  Patient Name: [name] Birthdate: 1940/08/11 Sex: female Admission Date (Current Location): 09/05/2017  [facility] and [place] Number:  Herbalist and Address:  [place], [place] [address] [place], [place] [address]      Provider Number: [id number]  Attending Physician Name and Address:  Janece Canterbury, MD  Relative Name and Phone Number:  Sumedha Munnerlyn, husband    Current Level of Care: Hospital Recommended Level of Care: [place] Prior Approval Number:    Date Approved/Denied:   PASRR Number:   [id number]   Discharge Plan: SNF    Current Diagnoses: Patient Active Problem List   Diagnosis Date Noted  . Diverticulosis of intestine with bleeding 09/05/2017  . Influenza A 08/08/2017  . Acute respiratory failure with hypoxia ([place]) 08/07/2017  . Depression 08/07/2017  . Hyperlipemia 08/07/2017  . Macular degeneration 08/07/2017  . Glaucoma 08/07/2017  . Weakness 08/07/2017  . Dehydration 08/07/2017  . Lower GI bleeding 10/14/2016  . Diverticulosis of colon 10/14/2016  . Anxiety 10/14/2016  . Lower GI bleed 10/14/2016  . Acute GI bleeding   . Osteoarthrosis, unspecified whether generalized or localized, lower leg 12/20/2011    Orientation RESPIRATION BLADDER Height & Weight     Self, Time, Situation, Place  O2(intermittant O2) Incontinent, External catheter Weight: 185 lb 3 oz (84 kg) Height:  5\' 2"  (157.5 cm)  BEHAVIORAL SYMPTOMS/MOOD NEUROLOGICAL BOWEL NUTRITION STATUS      Incontinent Diet(see discharge summary)  AMBULATORY STATUS COMMUNICATION OF NEEDS Skin   Extensive Assist Verbally Bruising(on head and around eyes)                       Personal Care Assistance Level of Assistance  Bathing, Feeding, Dressing Bathing Assistance: Limited assistance Feeding assistance: Independent Dressing Assistance: Limited assistance      Functional Limitations Info  Sight, Hearing, Speech Sight Info: Impaired(Glaucoma with macular degeneration) Hearing Info: Adequate Speech Info: Adequate    SPECIAL CARE FACTORS FREQUENCY  PT (By licensed PT), OT (By licensed OT)     PT Frequency: 5x week OT Frequency: 5x week            Contractures Contractures Info: Not present    Additional Factors Info  Code Status, Allergies, Psychotropic Code Status Info: Full Code Allergies Info: MORPHINE AND RELATED, CEFADROXIL  Psychotropic Info: sertraline (ZOLOFT) tablet 100 mg po daily         Current Medications (09/08/2017):  This is the current hospital active medication list Current Facility-Administered Medications  Medication Dose Route Frequency Provider Last Rate Last Dose  . 0.9 %  sodium chloride infusion  250 mL Intravenous PRN Lady Deutscher, MD      . acetaminophen (TYLENOL) tablet 650 mg  650 mg Oral Q6H PRN Lady Deutscher, MD   650 mg at 09/08/17 1127   Or  . acetaminophen (TYLENOL) suppository 650 mg  650 mg Rectal Q6H PRN Lady Deutscher, MD      . acidophilus (RISAQUAD) capsule 1 capsule  1 capsule Oral Daily Lady Deutscher, MD   1 capsule at 09/08/17 0948  . albuterol (PROVENTIL) (2.5 MG/3ML) 0.083% nebulizer solution 3 mL  3 mL Inhalation BID Lady Deutscher, MD   3 mL at 09/08/17 0754  . ALPRAZolam Duanne Moron) tablet 0.25 mg  0.25 mg Oral Daily PRN Lady Deutscher, MD      .  ampicillin-sulbactam (UNASYN) 1.5 g in sodium chloride 0.9 % 100 mL IVPB  1.5 g Intravenous Q6H Karren Cobble, RPH   Stopped at 09/08/17 1018  . azithromycin (ZITHROMAX) 500 mg in sodium chloride 0.9 % 250 mL IVPB  500 mg Intravenous Q24H Janece Canterbury, MD   Stopped at 09/08/17 [phone]  . cholecalciferol (VITAMIN D) tablet 2,000 Units  2,000 Units Oral BID Lady Deutscher, MD   2,000 Units at 09/08/17 [phone]  . dorzolamide-timolol (COSOPT) 22.3-6.8 MG/ML ophthalmic solution 1 drop  1 drop Both Eyes BID Lady Deutscher, MD   1 drop at 09/08/17 0949  . fluticasone (FLONASE) 50 MCG/ACT nasal spray 2 spray  2 spray Each Nare Daily Lady Deutscher, MD   2 spray at 09/08/17 0951  . HYDROcodone-acetaminophen (NORCO/VICODIN) 5-325 MG per tablet 1-2 tablet  1-2 tablet Oral Q6H PRN Lady Deutscher, MD      . loperamide (IMODIUM) capsule 4 mg  4 mg Oral PRN Lady Deutscher, MD      . loratadine (CLARITIN) tablet 10 mg  10 mg Oral Daily Lady Deutscher, MD   10 mg at 09/08/17 6301  . multivitamin with minerals tablet 1 tablet  1 tablet Oral Daily Lady Deutscher, MD   1 tablet at 09/08/17 0948  . ondansetron (ZOFRAN) tablet 4 mg  4 mg Oral Q6H PRN Lady Deutscher, MD       Or  . ondansetron [facility]) injection 4 mg  4 mg Intravenous Q6H PRN Lady Deutscher, MD      . oxyCODONE (Oxy IR/ROXICODONE) immediate release tablet 5 mg  5 mg Oral Q4H PRN Lady Deutscher, MD      . pantoprazole (PROTONIX) EC tablet 40 mg  40 mg Oral Daily Lady Deutscher, MD   40 mg at 09/08/17 0948  . polyethylene glycol (MIRALAX / GLYCOLAX) packet 17 g  17 g Oral Daily Lady Deutscher, MD   17 g at 09/08/17 0948  . potassium chloride SA (K-DUR,KLOR-CON) CR tablet 40 mEq  40 mEq Oral Mcarthur Rossetti, MD   40 mEq at 09/08/17 1437  . propranolol (INDERAL) tablet 20 mg  20 mg Oral Daily Janece Canterbury, MD   20 mg at 09/08/17 0948  . sertraline (ZOLOFT) tablet 100 mg  100 mg Oral Daily Lady Deutscher, MD   100 mg at 09/08/17 0947  . simvastatin (ZOCOR) tablet 20 mg  20 mg Oral Daily Lady Deutscher, MD   20 mg at 09/08/17 0948  . sodium chloride flush (NS) 0.9 % injection 3 mL  3 mL Intravenous Q12H Lady Deutscher, MD   3 mL at 09/07/17 0928  . sodium chloride flush (NS) 0.9 % injection 3 mL  3 mL Intravenous PRN Lady Deutscher, MD      . vitamin C (ASCORBIC ACID) tablet 1,000 mg  1,000 mg Oral Daily Lady Deutscher, MD   1,000 mg at 09/08/17 6010     Discharge Medications: Please see  discharge summary for a list of discharge medications.  Relevant Imaging Results:  Relevant Lab Results:   Additional Information SS# [place], [place]

## 2017-09-08 NOTE — Clinical Social Work Note (Signed)
Clinical Social Work Assessment  Patient Details  Name: [name] MRN: [id number] Date of Birth: 1940/11/27  Date of referral:  09/08/17               Reason for consult:  Facility Placement, Discharge Planning                Permission sought to share information with:  Facility Sport and exercise psychologist, Family Supports Permission granted to share information::  Yes, Verbal Permission Granted  Name::     Clinical research associate::  SNFs  Relationship::  husband  Contact Information:     Housing/Transportation Living arrangements for the past 2 months:  Single Family Home Source of Information:  Patient Patient Interpreter Needed:  None Criminal Activity/Legal Involvement Pertinent to Current Situation/Hospitalization:  No - Comment as needed Significant Relationships:  Adult Children, Warehouse manager, Spouse Lives with:  Spouse Do you feel safe going back to the place where you live?  Yes Need for family participation in patient care:  Yes (Comment)  Care giving concerns:Pt has been weak following multiple falls, states she thinks she will benefit from therapies at SNF. Had been active with [facility] following last fall and likes it but wants more.    Social Worker assessment / plan:  CSW met with pt at bedside, pt friendly and amenable to [place] visit. Pt states she has had multiple falls and was active with HH through [facility] but feels weak and that she could benefit from more therapy. Pt lives in a single family home with her husband [name]. Her son is a Psychologist, sport and exercise on the [place] of [place], and she feels well supported. Pt would like to see SNF options when available. CSW continuing to follow.   Employment status:  Retired Nurse, adult PT Recommendations:  [place] / Referral to community resources:  [place]  Patient/Family's Response to care:  Pt accepting and appreciative of CSW visit, amenable to SNF  recommendation.   Patient/Family's Understanding of and Emotional Response to Diagnosis, Current Treatment, and Prognosis:  Pt states understanding of diagnosis, current treatment, and prognosis. Pt exhibits insight and awareness of needs as evidenced by her request for more therapy than home health can offer, pt was emotionally appropriate throughout conversation, expressing frustration at falls, but hope for progress with continued therapies.   Emotional Assessment Appearance:  Appears stated age Attitude/Demeanor/Rapport:  Engaged Affect (typically observed):  Appropriate, Accepting Orientation:  Oriented to Self, Oriented to Place, Oriented to  Time, Oriented to Situation Alcohol / Substance use:  Not Applicable Psych involvement (Current and /or in the community):  No (Comment)  Discharge Needs  Concerns to be addressed:  Care Coordination, Discharge Planning Concerns Readmission within the last 30 days:    Current discharge risk:  Dependent with Mobility Barriers to Discharge:  Ship broker, Continued Medical Work up   [employer], [place] 09/08/2017, 5:46 PM

## 2017-09-08 NOTE — Progress Notes (Signed)
PT Cancellation Note  Patient Details Name: [name] MRN: [id number] DOB: Dec 26, 1940   Cancelled Treatment:    Reason Eval/Treat Not Completed: Medical issues which prohibited therapy. Discussed patient status with RN. RN states patient is very weak currently and will be receiving a transfusion shortly. RN recommended PT return at a later time. Will follow up this afternoon.  Ellamae Sia, PT, DPT Acute Rehabilitation Services     Willy Eddy 09/08/2017, 11:10 AM

## 2017-09-08 NOTE — Progress Notes (Signed)
[facility] Gastroenterology Progress Note  [name] KDTOIZ 77 y.o. [birth year]  CC:  GI bleed   Subjective: patient denied any further bleeding episode. Currently getting blood transfusion. had Small bowel movement today. Denied any blood in the stool.    Objective: Vital signs in last 24 hours: Vitals:   09/08/17 1104 09/08/17 1118  BP: 137/67 119/65  Pulse: 80 80  Resp: 16 16  Temp: (!) 100.4 F (38 C) 99.9 F (37.7 C)  SpO2: 98% 98%    Physical Exam:  General. Elderly-appearing patient. Not in acute distress Abdomen. Soft, nontender, nondistended, bowel sounds present  Lab Results: Recent Labs    09/07/17 0455 09/08/17 0407  NA 137 138  K 3.6 2.9*  CL 108 107  CO2 21* 25  GLUCOSE 96 113*  BUN 13 6  CREATININE 0.48 0.51  CALCIUM 7.5* 7.6*  MG  --  2.2   Recent Labs    09/06/17 0950  AST 21  ALT 10*  ALKPHOS 60  BILITOT 1.1  PROT 5.2*  ALBUMIN 2.3*   Recent Labs    09/06/17 0950  09/07/17 0455 09/07/17 1411 09/08/17 0407  WBC 17.0*  --  14.0*  --  13.8*  NEUTROABS 14.4*  --   --   --   --   HGB 8.5*   < > 7.4* 7.8* 7.2*  HCT 26.9*   < > 23.6* 25.2* 23.3*  MCV 91.2  --  92.9  --  92.5  PLT 164  --  138*  --  162   < > = values in this interval not displayed.   Recent Labs    09/06/17 0950  LABPROT 16.1*  INR 1.30      Assessment/Plan: - lower GI bleed. Most likely diverticular. Resolved. - Blood loss anemia. Hemoglobin down to 7.2 this morning. Currently getting blood transfusion.  Recommendations -------------------------- - Discussed with nursing staff. No evidence of active bleeding. Had small normal bowel movement this morning. - Start full liquid diet. - Monitor H&H. GI will follow. Hopefully discharge home tomorrow if hemoglobin remains stable.  Otis Brace MD, [place] 09/08/2017, 12:47 PM  Contact #  [phone]

## 2017-09-08 NOTE — Progress Notes (Addendum)
PROGRESS NOTE  [name]  ZOX:[id number] DOB: June 28, 1941 DOA: 09/05/2017 PCP: Betty Infante, MD  Brief Narrative:   The patient is a 77 year old female with history of anxiety, depression, GERD, smoking, arthritis, and diverticulosis.  She has a history of multiple episodes of diverticular bleeding including most recently in April 2018.  She presented to the emergency department a couple days prior to admission after a fall resulted in head trauma however head CT was negative.  At the time she was otherwise feeling well was discharged to home from the ER.  Over the subsequent days she had headache and felt fatigued.  She presented to the emergency department on 3/8 after she developed bright red blood per rectum and had 6-7 bloody bowel movements at home.  In the emergency department, she was found to have had a drop in her hemoglobin from 12.9 g/dL to 9.4 g/dL and she had active bleeding in the ER.  She was started on IV fluids and gastroenterology was consulted.  Overnight from 3/8-3/9 she spiked a fever to 102.6 Fahrenheit and became tachycardic.  She met criteria for sepsis and sepsis protocol was initiated.  She was started on empiric antibiotics which were narrowed after chest x-ray revealed that she has a large left lower lobe pneumonia.  Assessment & Plan:  Sepsis (fever, leukocytosis, tachycardia, tachypnea, elevated lactic acid)  and acute hypoxic respiratory failure secondary to left lower lobe pneumonia which was likely already evolving at the time of admission. - Continue Unasyn and azithromycin -Prednisone 15m daily x 5 days, day 3 -Wean oxygen as tolerated -Blood cultures(drawn after initiation of antibiotics) NGTD -Sputum culture if able  Sinus tachycardia, related to sepsis vs. Symptomatic anemia, HR has trended down some with treatment of pneumonia  Acute blood loss anemia secondary to diverticular hemorrhage, bleeding appears to be slowing -Hemoglobin has trended down to  7.2 g/dL -Transfuse 1 unit PRBC - advance to full liquids diet   Hyperlipidemia, Stable, continue simvastatin  Glaucoma with macular degeneration, stable, continued eyedrops  Hypertension, blood pressure starting to improve -Hold parameters placed on propranolol  Hypokalemia, check magnesium level -Oral potassium repletion  DVT prophylaxis: SCDs Code Status: Full code verified with patient.  She would want intubation, BiPAP if needed to treat this pneumonia. Family Communication:  No family at bedside.  The patient has a son Betty Baldassariphone number [phone] is a sPsychologist, sport and [place]  He would like daily updates if possible. Disposition Plan:  PT recommending SNF.  Given her ongoing mild respiratory distress related to her pneumonia, I would anticipate several more days of hospitalization before she is ready for discharge to skilled nursing facility.  Transfusing blood today.  Consultants:   [name] gastroenterology  Procedures:  None  Antimicrobials:  Anti-infectives (From admission, onward)   Start     Dose/Rate Route Frequency Ordered Stop   09/07/17 0800  levofloxacin (LEVAQUIN) IVPB 500 mg  Status:  Discontinued     500 mg 100 mL/hr over 60 Minutes Intravenous Every 24 hours 09/06/17 0748 09/06/17 1238   09/07/17 0800  azithromycin (ZITHROMAX) 500 mg in sodium chloride 0.9 % 250 mL IVPB     500 mg 250 mL/hr over 60 Minutes Intravenous Every 24 hours 09/06/17 1238     09/06/17 2200  vancomycin (VANCOCIN) IVPB 750 mg/150 ml premix  Status:  Discontinued     750 mg 150 mL/hr over 60 Minutes Intravenous Every 12 hours 09/06/17 0748 09/06/17 1136   09/06/17  1600  ampicillin-sulbactam (UNASYN) 1.5 g in sodium chloride 0.9 % 100 mL IVPB     1.5 g 200 mL/hr over 30 Minutes Intravenous Every 6 hours 09/06/17 1340     09/06/17 1400  aztreonam (AZACTAM) 2 g in sodium chloride 0.9 % 100 mL IVPB  Status:  Discontinued     2 g 200 mL/hr over 30 Minutes  Intravenous Every 8 hours 09/06/17 0748 09/06/17 1136   09/06/17 0800  levofloxacin (LEVAQUIN) IVPB 750 mg     750 mg 100 mL/hr over 90 Minutes Intravenous  Once 09/06/17 0723 09/06/17 1000   09/06/17 0730  aztreonam (AZACTAM) 2 g in sodium chloride 0.9 % 100 mL IVPB     2 g 200 mL/hr over 30 Minutes Intravenous  Once 09/06/17 0723 09/06/17 0850   09/06/17 0730  vancomycin (VANCOCIN) 1,500 mg in sodium chloride 0.9 % 500 mL IVPB     1,500 mg 250 mL/hr over 120 Minutes Intravenous  Once 09/06/17 0723 09/06/17 1309       Subjective:  Had a soft black stool this morning.  Yesterday she had a watery reddish brown stool.  She denies nausea, abdominal pains.  She still feels very weak and [name] of breath but her strength appears to be coming back a little bit.  One low grade fever overnight.    Objective: Vitals:   09/08/17 0754 09/08/17 1104 09/08/17 1118 09/08/17 1400  BP:  137/67 119/65 120/78  Pulse:  80 80 87  Resp:  '16 16 18  ' Temp:  (!) 100.4 F (38 C) 99.9 F (37.7 C) 98.6 F (37 C)  TempSrc:  Oral Oral Oral  SpO2: 96% 98% 98% 93%  Weight:      Height:        Intake/Output Summary (Last 24 hours) at 09/08/2017 1544 Last data filed at 09/08/2017 1400 Gross per 24 hour  Intake 722 ml  Output 700 ml  Net 22 ml   Filed Weights   09/05/17 0838 09/05/17 1540  Weight: 82.6 kg (182 lb) 84 kg (185 lb 3 oz)    Examination:  General exam:  Adult female.  Mild respiratory distress with tachypnea and occasional SCM retractions at rest in bed HEENT:  MMM, bruising around her eyes and across the bridge of her nose Respiratory system: Improved aeration on the left anterior and left posterior chest.  She now has copious rales in the anterior chest and left posterior chest base to the mid back.  Persistent wheeze, no rhonchi  cardiovascular system: Regular rate and rhythm, normal S1/S2. No murmurs, rubs, gallops or clicks.  Warm extremities Gastrointestinal system: Normal active  bowel sounds, soft, nondistended, nontender. MSK:  Normal tone and bulk, no lower extremity edema Neuro:  Grossly intact  Data Reviewed: I have personally reviewed following labs and imaging studies  CBC: Recent Labs  Lab 09/05/17 0858  09/06/17 0950 09/06/17 1605 09/06/17 2332 09/07/17 0455 09/07/17 1411 09/08/17 0407  WBC 12.9*  --  17.0*  --   --  14.0*  --  13.8*  NEUTROABS 10.6*  --  14.4*  --   --   --   --   --   HGB 9.4*   < > 8.5* 8.0* 7.5* 7.4* 7.8* 7.2*  HCT 28.5*   < > 26.9* 25.4* 23.9* 23.6* 25.2* 23.3*  MCV 91.3  --  91.2  --   --  92.9  --  92.5  PLT 144*  --  164  --   --  138*  --  162   < > = values in this interval not displayed.   Basic Metabolic Panel: Recent Labs  Lab 09/05/17 0858 09/06/17 0406 09/06/17 0950 09/07/17 0455 09/08/17 0407  NA 136 134* 133* 137 138  K 4.4 3.5 3.7 3.6 2.9*  CL 105 103 102 108 107  CO2 22 21* 20* 21* 25  GLUCOSE 145* 129* 194* 96 113*  BUN '15 10 12 13 6  ' CREATININE 0.64 0.57 0.63 0.48 0.51  CALCIUM 8.1* 7.8* 7.7* 7.5* 7.6*  MG  --   --   --   --  2.2   GFR: Estimated Creatinine Clearance: 58.3 mL/min (by C-G formula based on SCr of 0.51 mg/dL). Liver Function Tests: Recent Labs  Lab 09/05/17 0858 09/06/17 0950  AST 23 21  ALT 11* 10*  ALKPHOS 56 60  BILITOT 1.3* 1.1  PROT 5.5* 5.2*  ALBUMIN 2.8* 2.3*   No results for input(s): LIPASE, AMYLASE in the last 168 hours. No results for input(s): AMMONIA in the last 168 hours. Coagulation Profile: Recent Labs  Lab 09/06/17 0950  INR 1.30   Cardiac Enzymes: No results for input(s): CKTOTAL, CKMB, CKMBINDEX, TROPONINI in the last 168 hours. BNP (last 3 results) No results for input(s): PROBNP in the last 8760 hours. HbA1C: No results for input(s): HGBA1C in the last 72 hours. CBG: Recent Labs  Lab 09/05/17 0847  GLUCAP 145*   Lipid Profile: No results for input(s): CHOL, HDL, LDLCALC, TRIG, CHOLHDL, LDLDIRECT in the last 72 hours. Thyroid Function  Tests: No results for input(s): TSH, T4TOTAL, FREET4, T3FREE, THYROIDAB in the last 72 hours. Anemia Panel: No results for input(s): VITAMINB12, FOLATE, FERRITIN, TIBC, IRON, RETICCTPCT in the last 72 hours. Urine analysis:    Component Value Date/Time   COLORURINE YELLOW 09/06/2017 0721   APPEARANCEUR HAZY (A) 09/06/2017 0721   LABSPEC 1.023 09/06/2017 0721   PHURINE 5.0 09/06/2017 0721   GLUCOSEU NEGATIVE 09/06/2017 0721   HGBUR NEGATIVE 09/06/2017 0721   BILIRUBINUR NEGATIVE 09/06/2017 0721   KETONESUR 20 (A) 09/06/2017 0721   PROTEINUR NEGATIVE 09/06/2017 0721   UROBILINOGEN 0.2 10/20/2007 2015   NITRITE POSITIVE (A) 09/06/2017 0721   LEUKOCYTESUR NEGATIVE 09/06/2017 0721   Sepsis Labs: '@LABRCNTIP' (procalcitonin:4,lacticidven:4)  ) Recent Results (from the past 240 hour(s))  Culture, Urine     Status: Abnormal   Collection Time: 09/06/17  9:34 AM  Result Value Ref Range Status   Specimen Description URINE, CLEAN CATCH  Final   Special Requests   Final    NONE Performed at [facility], [place] [address] [place], [place] [address]    Culture MULTIPLE SPECIES PRESENT, SUGGEST RECOLLECTION (A)  Final   Report Status 09/07/2017 FINAL  Final  Culture, blood (Routine X 2) w Reflex to ID Panel     Status: None (Preliminary result)   Collection Time: 09/06/17  9:40 AM  Result Value Ref Range Status   Specimen Description BLOOD RIGHT ANTECUBITAL  Final   Special Requests   Final    IN PEDIATRIC BOTTLE Blood Culture results may not be optimal due to an excessive volume of blood received in culture bottles   Culture   Final    NO GROWTH 2 DAYS Performed at [facility] [address], [place], [place] [address]    Report Status PENDING  Incomplete  Culture, blood (Routine X 2) w Reflex to ID Panel     Status: None (Preliminary result)   Collection  Time: 09/06/17  9:50 AM  Result Value Ref Range Status   Specimen Description BLOOD RIGHT ANTECUBITAL  Final   Special  Requests   Final    BOTTLES DRAWN AEROBIC AND ANAEROBIC Blood Culture adequate volume   Culture   Final    NO GROWTH 2 DAYS Performed at [facility], [address] [place], [place] [address]    Report Status PENDING  Incomplete  MRSA PCR Screening     Status: None   Collection Time: 09/06/17 11:58 AM  Result Value Ref Range Status   MRSA by PCR NEGATIVE NEGATIVE Final    Comment:        The GeneXpert MRSA Assay (FDA approved for NASAL specimens only), is one component of a comprehensive MRSA colonization surveillance program. It is not intended to diagnose MRSA infection nor to guide or monitor treatment for MRSA infections. Performed at [facility], [place] [address] [place], [place] [address]       Radiology Studies: No results found.   Scheduled Meds: . acidophilus  1 capsule Oral Daily  . albuterol  3 mL Inhalation BID  . cholecalciferol  2,000 Units Oral BID  . dorzolamide-timolol  1 drop Both Eyes BID  . fluticasone  2 spray Each Nare Daily  . loratadine  10 mg Oral Daily  . multivitamin with minerals  1 tablet Oral Daily  . pantoprazole  40 mg Oral Daily  . polyethylene glycol  17 g Oral Daily  . potassium chloride  40 mEq Oral Q4H  . propranolol  20 mg Oral Daily  . sertraline  100 mg Oral Daily  . simvastatin  20 mg Oral Daily  . sodium chloride flush  3 mL Intravenous Q12H  . vitamin C  1,000 mg Oral Daily   Continuous Infusions: . sodium chloride    . ampicillin-sulbactam (UNASYN) IV Stopped (09/08/17 1018)  . azithromycin Stopped (09/08/17 0948)     LOS: 3 days    Time spent: 30 min    Janece Canterbury, MD [facility] Pager [phone]  If 7PM-7AM, please contact night-coverage [email or web address] Password [facility] 09/08/2017, 3:44 PM

## 2017-09-08 NOTE — Evaluation (Signed)
Physical Therapy Evaluation Patient Details Name: [name] MRN: [id number] DOB: May 15, 1941 Today's Date: 09/08/2017   History of Present Illness  77 yo female with significant PMH of TKA, osteopenia, HTN, frequent falls, BPPV who was admitted for rectal bleeding.   Clinical Impression  Pt admitted with above diagnosis. Pt currently with functional limitations due to the deficits listed below (see PT Problem List). At the time of PT eval, patient functional mobility is below baseline secondary to shortness of breath, generalized weakness,diminished balance and decreased endurance. Patient able to ambulate 25 feet with RW and min guard assist (chair follow required). At rest, patient at 92% SpO2 and following ambulation, patient decreased to 88% SpO2. RN placed patient on [place]. Patient requesting SNF at this time due to decreased caregiver support at home. Patient currently presenting as a significant fall risk and will benefit from SNF to maximize functional independnece. Pt will benefit from skilled PT to increase their independence and safety with mobility to allow discharge to the venue listed below.       Follow Up Recommendations SNF    Equipment Recommendations  None recommended by PT;Other (comment)(Defer to SNF)    Recommendations for Other Services       Precautions / Restrictions Precautions Precautions: Fall Restrictions Weight Bearing Restrictions: No      Mobility  Bed Mobility               General bed mobility comments: Pt OOB on BSC upon PT arrival. Returned to chair at end of session.  Transfers Overall transfer level: Needs assistance Equipment used: Rolling walker (2 wheeled);None Transfers: Sit to/from Stand Sit to Stand: Supervision         General transfer comment: Patient transferred from [facility] and edge of bed with supervision.   Ambulation/Gait Ambulation/Gait assistance: Min guard;Min assist Ambulation Distance (Feet): 25 Feet Assistive  device: Rolling walker (2 wheeled);None Gait Pattern/deviations: Narrow base of support;Shuffle;Decreased stride length   Gait velocity interpretation: Below normal speed for age/gender General Gait Details: Patient initially ambulated 3 feet with no device and min assist required due to diminished balance and shuffling gait. Patient then ambulated an additional 23 feet with a RW and chair follow and min guard. Demonstrates bilateral hip ER t/o gait. VC's for RW proximity.    Stairs            Wheelchair Mobility    Modified Rankin (Stroke Patients Only)       Balance Overall balance assessment: Needs assistance Sitting-balance support: No upper extremity supported Sitting balance-Leahy Scale: Good     Standing balance support: No upper extremity supported Standing balance-Leahy Scale: Fair                               Pertinent Vitals/Pain Pain Assessment: No/denies pain    Home Living Family/patient expects to be discharged to:: Private residence Living Arrangements: Spouse/significant other Available Help at Discharge: Family Type of Home: House Home Access: Stairs to enter Entrance Stairs-Rails: Left Entrance Stairs-Number of Steps: 3 Home Layout: One level Home Equipment: Walker - 2 wheels;Shower seat      Prior Function Level of Independence: Independent         Comments: Patient states she performs household ambulation independently without a RW at baseline.      Hand Dominance        Extremity/Trunk Assessment   Upper Extremity Assessment Upper Extremity Assessment: Generalized weakness  Lower Extremity Assessment Lower Extremity Assessment: Generalized weakness    Cervical / Trunk Assessment Cervical / Trunk Assessment: Kyphotic;Other exceptions Cervical / Trunk Exceptions: Forward head and decreased cervical lordosis  Communication   Communication: No difficulties  Cognition Arousal/Alertness: Awake/alert Behavior  During Therapy: WFL for tasks assessed/performed Overall Cognitive Status: Within Functional Limits for tasks assessed                                        General Comments      Exercises     Assessment/Plan    PT Assessment    PT Problem List         PT Treatment Interventions      PT Goals (Current goals can be found in the Care Plan section)  Acute Rehab PT Goals Patient Stated Goal: Get strong again PT Goal Formulation: With patient Time For Goal Achievement: 09/22/17 Potential to Achieve Goals: Good    Frequency Min 2X/week   Barriers to discharge        Co-evaluation               AM-PAC PT "6 Clicks" Daily Activity  Outcome Measure Difficulty turning over in bed (including adjusting bedclothes, sheets and blankets)?: A Little Difficulty moving from lying on back to sitting on the side of the bed? : A Little Difficulty sitting down on and standing up from a chair with arms (e.g., wheelchair, bedside commode, etc,.)?: A Little Help needed moving to and from a bed to chair (including a wheelchair)?: A Little Help needed walking in hospital room?: A Little Help needed climbing 3-5 steps with a railing? : A Lot 6 Click Score: 17    End of Session Equipment Utilized During Treatment: Gait belt Activity Tolerance: Patient tolerated treatment well Patient left: in chair;with nursing/sitter in room;with call bell/phone within reach Nurse Communication: Mobility status PT Visit Diagnosis: Unsteadiness on feet (R26.81);History of falling (Z91.81)    Time: 1415-1440 PT Time Calculation (min) (ACUTE ONLY): 25 min   Charges:   PT Evaluation $PT Eval Moderate Complexity: 1 Mod PT Treatments $Gait Training: 8-22 mins   PT G Codes:       Ellamae Sia, PT, DPT Acute Rehabilitation Services    Willy Eddy 09/08/2017, 3:07 PM

## 2017-09-09 ENCOUNTER — Inpatient Hospital Stay (HOSPITAL_COMMUNITY): Payer: Medicare HMO

## 2017-09-09 LAB — CBC
HCT: 30.1 % — ABNORMAL LOW (ref 36.0–46.0)
HEMATOCRIT: 30.4 % — AB (ref 36.0–46.0)
HEMOGLOBIN: 9.4 g/dL — AB (ref 12.0–15.0)
Hemoglobin: 9.4 g/dL — ABNORMAL LOW (ref 12.0–15.0)
MCH: 28.7 pg (ref 26.0–34.0)
MCH: 29.1 pg (ref 26.0–34.0)
MCHC: 30.9 g/dL (ref 30.0–36.0)
MCHC: 31.2 g/dL (ref 30.0–36.0)
MCV: 93 fL (ref 78.0–100.0)
MCV: 93.2 fL (ref 78.0–100.0)
Platelets: 226 10*3/uL (ref 150–400)
Platelets: 220 10*3/uL (ref 150–400)
RBC: 3.27 MIL/uL — ABNORMAL LOW (ref 3.87–5.11)
RBC: 3.23 MIL/uL — ABNORMAL LOW (ref 3.87–5.11)
RDW: 16.9 % — ABNORMAL HIGH (ref 11.5–15.5)
RDW: 17.1 % — AB (ref 11.5–15.5)
WBC: 19.1 10*3/uL — AB (ref 4.0–10.5)
WBC: 18.3 10*3/uL — ABNORMAL HIGH (ref 4.0–10.5)

## 2017-09-09 LAB — BASIC METABOLIC PANEL
ANION GAP: 8 (ref 5–15)
BUN: <5 mg/dL — ABNORMAL LOW (ref 6–20)
CHLORIDE: 103 mmol/L (ref 101–111)
CO2: 26 mmol/L (ref 22–32)
Calcium: 8.1 mg/dL — ABNORMAL LOW (ref 8.9–10.3)
Creatinine, Ser: 0.43 mg/dL — ABNORMAL LOW (ref 0.44–1.00)
GFR calc Af Amer: >60 mL/min (ref >60)
GLUCOSE: 144 mg/dL — AB (ref 65–99)
POTASSIUM: 4.4 mmol/L (ref 3.5–5.1)
Sodium: 137 mmol/L (ref 135–145)

## 2017-09-09 LAB — TYPE AND SCREEN
ABO/RH(D): O POS
Antibody Screen: NEGATIVE
Unit division: 0

## 2017-09-09 LAB — COMPREHENSIVE METABOLIC PANEL
ALBUMIN: 2.2 g/dL — AB (ref 3.5–5.0)
ALT: 13 U/L — ABNORMAL LOW (ref 14–54)
AST: 19 U/L (ref 15–41)
Alkaline Phosphatase: 84 U/L (ref 38–126)
Anion gap: 10 (ref 5–15)
CHLORIDE: 102 mmol/L (ref 101–111)
CO2: 26 mmol/L (ref 22–32)
Calcium: 8.1 mg/dL — ABNORMAL LOW (ref 8.9–10.3)
Creatinine, Ser: 0.44 mg/dL (ref 0.44–1.00)
GFR calc Af Amer: >60 mL/min (ref >60)
GFR calc non Af Amer: >60 mL/min (ref >60)
GLUCOSE: 134 mg/dL — AB (ref 65–99)
Potassium: 4.5 mmol/L (ref 3.5–5.1)
Sodium: 138 mmol/L (ref 135–145)
Total Bilirubin: 0.3 mg/dL (ref 0.3–1.2)
Total Protein: 5.4 g/dL — ABNORMAL LOW (ref 6.5–8.1)

## 2017-09-09 LAB — BPAM RBC
Blood Product Expiration Date: 201904042359
ISSUE DATE / TIME: 201903111056
Unit Type and Rh: 5100

## 2017-09-09 LAB — BLOOD GAS, ARTERIAL
Acid-Base Excess: 4.2 mmol/L — ABNORMAL HIGH (ref 0.0–2.0)
Bicarbonate: 30.1 mmol/L — ABNORMAL HIGH (ref 20.0–28.0)
Drawn by: 441371
O2 CONTENT: 3 L/min
O2 SAT: 91.5 %
PATIENT TEMPERATURE: 98.6
pCO2 arterial: 62.1 mmHg — ABNORMAL HIGH (ref 32.0–48.0)
pH, Arterial: 7.306 — ABNORMAL LOW (ref 7.350–7.450)
pO2, Arterial: 62.9 mmHg — ABNORMAL LOW (ref 83.0–108.0)

## 2017-09-09 LAB — POCT I-STAT 3, ART BLOOD GAS (G3+)
Acid-Base Excess: 4 mmol/L — ABNORMAL HIGH (ref 0.0–2.0)
Bicarbonate: 30.9 mmol/L — ABNORMAL HIGH (ref 20.0–28.0)
O2 SAT: 94 %
PCO2 ART: 58.2 mmHg — AB (ref 32.0–48.0)
PO2 ART: 80 mmHg — AB (ref 83.0–108.0)
Patient temperature: 99.3
TCO2: 33 mmol/L — AB (ref 22–32)
pH, Arterial: 7.335 — ABNORMAL LOW (ref 7.350–7.450)

## 2017-09-09 LAB — LACTIC ACID, PLASMA: Lactic Acid, Venous: 0.7 mmol/L (ref 0.5–1.9)

## 2017-09-09 LAB — TROPONIN I: Troponin I: <0.03 ng/mL (ref <0.03)

## 2017-09-09 LAB — AMMONIA: Ammonia: 33 umol/L (ref 9–35)

## 2017-09-09 LAB — GLUCOSE, CAPILLARY: Glucose-Capillary: 132 mg/dL — ABNORMAL HIGH (ref 65–99)

## 2017-09-09 MED ORDER — FUROSEMIDE 10 MG/ML IJ SOLN
40.0000 mg | Freq: Once | INTRAMUSCULAR | Status: AC
  Administered 2017-09-09: 40 mg via INTRAVENOUS

## 2017-09-09 MED ORDER — FUROSEMIDE 10 MG/ML IJ SOLN
INTRAMUSCULAR | Status: AC
  Filled 2017-09-09: qty 4

## 2017-09-09 MED ORDER — METOPROLOL TARTRATE 5 MG/5ML IV SOLN
2.5000 mg | Freq: Four times a day (QID) | INTRAVENOUS | Status: DC
  Administered 2017-09-09 – 2017-09-10 (×5): 2.5 mg via INTRAVENOUS
  Filled 2017-09-09 (×5): qty 5

## 2017-09-09 MED ORDER — CHLORHEXIDINE GLUCONATE 0.12 % MT SOLN
15.0000 mL | Freq: Two times a day (BID) | OROMUCOSAL | Status: DC
  Administered 2017-09-09 – 2017-09-14 (×10): 15 mL via OROMUCOSAL
  Filled 2017-09-09 (×7): qty 15

## 2017-09-09 MED ORDER — PIPERACILLIN-TAZOBACTAM 3.375 G IVPB
3.3750 g | Freq: Three times a day (TID) | INTRAVENOUS | Status: DC
  Administered 2017-09-09 – 2017-09-13 (×12): 3.375 g via INTRAVENOUS
  Filled 2017-09-09 (×16): qty 50

## 2017-09-09 MED ORDER — ORAL CARE MOUTH RINSE
15.0000 mL | Freq: Two times a day (BID) | OROMUCOSAL | Status: DC
  Administered 2017-09-09 – 2017-09-11 (×6): 15 mL via OROMUCOSAL

## 2017-09-09 NOTE — Progress Notes (Signed)
[facility]astroenterology Progress Note  Lizette Pazos BWIOMB 77 y.o. [birth year]  CC:  GI bleed   Subjective:  Patient was transferred to ICU for altered mental status and acute respiratory failure. Currently on BiPAP. Discussed with nursing staff. No evidence of further bleeding. Hemoglobin stable.    Objective: Vital signs in last 24 hours: Vitals:   09/09/17 1100 09/09/17 1107  BP: 128/76 128/76  Pulse: (!) 110 (!) 116  Resp: (!) 23 (!) 26  Temp:    SpO2: 97% 97%    Physical Exam:  General. Elderly-appearing patient. Currently on BiPAP Abdomen. Soft, nontender, nondistended, bowel sounds present  Lab Results: Recent Labs    09/08/17 0407 09/09/17 0443 09/09/17 0938  NA 138 137 138  K 2.9* 4.4 4.5  CL 107 103 102  CO2 25 26 26   GLUCOSE 113* 144* 134*  BUN 6 <5* <5*  CREATININE 0.51 0.43* 0.44  CALCIUM 7.6* 8.1* 8.1*  MG 2.2  --   --    Recent Labs    09/09/17 0938  AST 19  ALT 13*  ALKPHOS 84  BILITOT 0.3  PROT 5.4*  ALBUMIN 2.2*   Recent Labs    09/09/17 0443 09/09/17 0938  WBC 19.1* 18.3*  HGB 9.4* 9.4*  HCT 30.4* 30.1*  MCV 93.0 93.2  PLT 226 220   No results for input(s): LABPROT, INR in the last 72 hours.    Assessment/Plan: - lower GI bleed. Most likely diverticular. Resolved. - Blood loss anemia. S/P  blood transfusion. Hemoglobin stable at 9.4  Recommendations -------------------------- - Patient with acute respiratory failure and altered mental status. Currently on BiPAP. No further bleeding episode. No plan for further inpatient GI workup at this point. GI will sign off. Call us back if needed   Otis Brace MD, [place] 09/09/2017, 11:17 AM  Contact #  [phone]

## 2017-09-09 NOTE — Progress Notes (Signed)
Patient arrived to 4N17 AAOx3 with no complaints of pain but states she feels "bad". Tachycardic at the moment in the 120's, BP stable. BiPap on and pt resting comfortably. Yomara Toothman, Rande Brunt, RN

## 2017-09-09 NOTE — Progress Notes (Signed)
ABG values given to RN.

## 2017-09-09 NOTE — Progress Notes (Addendum)
0830 Pt is lethargic, cannot stay awake. Answers questions appropriately but falls back to sleep easily. V/S checked, O2 sats 90% at 2L.  RR 20. Dr Sheran Fava paged and notified. Came in and examined pt. ABGs, EKG done, results in. Dr Sheran Fava came back. Will start pt on a BIPAP, RT and RRT notified. Will transfer pt to a higher acuity level care unit. BIPAP started by RRT. RRT Jeneen Rinks in the room.  1015 Transferred pt to 4N17 ICU, report was given to [place] in ICU. Pt's son Legrand Como was notified by Dr Sheran Fava.

## 2017-09-09 NOTE — Progress Notes (Signed)
Called to bedside at 0932 for a pt in respiratory distress.  Internal Medicine team was notified by primary RN and an ABG, CXR, and EKG were done prior to my arrival.  VSS.  Pt was placed on Bipap.  40mg  lasix administered by Mable Fill, RN.   On exam, the pt was lethargic and confused. Lung sounds were diminished bilaterally.  Breathing pattern was labored with accessory muscle use and tachypnea.  The pt appeared pale, skin was warm and dry, bruising present underneath eyes bilaterally.  NSR on monitor with a rate of 80-100, BP stable.    Orders were placed by Dr. Sheran Fava for SDU, but there were no beds available.  Pt was transferred to 4N17 for further care.

## 2017-09-09 NOTE — Progress Notes (Signed)
Placed patient on 10L Hi-Flo while giving scheduled meds, see EMAR. Mouth care given. Patient tolerated meds with sips of sprite. Patient placed back on Bipap.

## 2017-09-09 NOTE — Procedures (Signed)
Pt placed on Bipap at this time per MD order.  Pt tolerating well. Rt will monitor.

## 2017-09-09 NOTE — Progress Notes (Signed)
 PROGRESS NOTE  [name]  MRN:[id number] DOB: 04/28/1941 DOA: 09/05/2017 PCP: Perini, Mark, MD  Brief Narrative:   The patient is a 77-year-old female with history of anxiety, depression, GERD, smoking, arthritis, and diverticulosis.  She has a history of multiple episodes of diverticular bleeding including most recently in April 2018.  She presented to the emergency department a couple days prior to admission after a fall resulted in head trauma however head CT was negative.  At the time she was otherwise feeling well was discharged to home from the ER.  Over the subsequent days she had headache and felt fatigued.  She presented to the emergency department on 3/8 after she developed bright red blood per rectum and had 6-7 bloody bowel movements at home.  In the emergency department, she was found to have had a drop in her hemoglobin from 12.9 g/dL to 9.4 g/dL and she had active bleeding in the ER.  She was started on IV fluids and gastroenterology was consulted.  Overnight from 3/8-3/9 she spiked a fever to 102.6 Fahrenheit and became tachycardic.  She met criteria for sepsis and sepsis protocol was initiated.  She was started on empiric antibiotics which were narrowed after chest x-ray revealed that she has a large left lower lobe pneumonia.  Assessment & Plan:  Acute hypoxic and hypercapnic respiratory failure due to RLL pneumonia - Continue Unasyn and azithromycin - Prednisone 40mg daily x 5 days, day 4 - Continue albuterol - Transfer to stepdown -  Continuous bipap -  Repeat ABG in 1 hour -  Blood cultures(drawn after initiation of antibiotics) NGTD -  Sputum culture if able -  Repeat CXR  Acute metabolic encephalopathy, likely due to hypercapnea.  Patient did not receive sedating medications overnight.  WBC has risen but she was on antibiotics and steroids -  ABG:  hypercapnea and acidosis -  Ammonia level -  Troponin -  ECG:  NSR, no ischemic changes -  NPO until mentation  improves -  Hold oral medications  Sepsis (fever, leukocytosis, tachycardia, tachypnea, elevated lactic acid)  and acute hypoxic respiratory failure secondary to left lower lobe pneumonia - Continue Unasyn and azithromycin -Prednisone 40mg daily x 5 days, day 4 -Wean oxygen as tolerated -Blood cultures(drawn after initiation of antibiotics) NGTD -Sputum culture if able  Sinus tachycardia, related to sepsis vs. Symptomatic anemia, HR has trended down some with treatment of pneumonia - holding propranolol - start scheduled IV metoprolol  Acute blood loss anemia secondary to diverticular hemorrhage, bleeding appears to be slowing -Hemoglobin has trended down to 7.2 g/dL -Transfused 1 unit PRBC on 3/11 with good hemoglobin response  Hyperlipidemia, Stable, continue simvastatin  Glaucoma with macular degeneration, stable, continued eyedrops  Hypertension, blood pressure improved (higher)  Hypokalemia, check magnesium level -Oral potassium repletion  DVT prophylaxis: SCDs Code Status: Full code verified with patient.  She would want intubation, BiPAP if needed to treat this pneumonia. Family Communication:  No family at bedside.  The patient has a son Mike Casher phone number [phone] who is a surgeon in [place], [place].  He would like daily updates if possible. Disposition Plan:  Transfer to stepdown  Consultants:   [place] [facility]  Procedures:  None  Antimicrobials:  Anti-infectives (From admission, onward)   Start     Dose/Rate Route Frequency Ordered Stop   09/07/17 0800  levofloxacin (LEVAQUIN) IVPB 500 mg  Status:  Discontinued     500 mg 100 mL/hr over 60 Minutes Intravenous Every   24 hours 09/06/17 0748 09/06/17 1238   09/07/17 0800  azithromycin (ZITHROMAX) 500 mg in sodium chloride 0.9 % 250 mL IVPB     500 mg 250 mL/hr over 60 Minutes Intravenous Every 24 hours 09/06/17 1238     09/06/17 2200  vancomycin (VANCOCIN) IVPB 750 mg/150 ml premix   Status:  Discontinued     750 mg 150 mL/hr over 60 Minutes Intravenous Every 12 hours 09/06/17 0748 09/06/17 1136   09/06/17 1600  ampicillin-sulbactam (UNASYN) 1.5 g in sodium chloride 0.9 % 100 mL IVPB  Status:  Discontinued     1.5 g 200 mL/hr over 30 Minutes Intravenous Every 6 hours 09/06/17 1340 09/09/17 0942   09/06/17 1400  aztreonam (AZACTAM) 2 g in sodium chloride 0.9 % 100 mL IVPB  Status:  Discontinued     2 g 200 mL/hr over 30 Minutes Intravenous Every 8 hours 09/06/17 0748 09/06/17 1136   09/06/17 0800  levofloxacin (LEVAQUIN) IVPB 750 mg     750 mg 100 mL/hr over 90 Minutes Intravenous  Once 09/06/17 0723 09/06/17 1000   09/06/17 0730  aztreonam (AZACTAM) 2 g in sodium chloride 0.9 % 100 mL IVPB     2 g 200 mL/hr over 30 Minutes Intravenous  Once 09/06/17 0723 09/06/17 0850   09/06/17 0730  vancomycin (VANCOCIN) 1,500 mg in sodium chloride 0.9 % 500 mL IVPB     1,500 mg 250 mL/hr over 120 Minutes Intravenous  Once 09/06/17 0723 09/06/17 1309       Subjective:  Stools are dark but becoming more stool-like according to nursing staff.  She was up with PT yesterday and speaking clearly with nursing staff.  This morning, she is lethargic and having a difficult time staying awake.  Denies chest pain, abdominal pain, difficulty breathing.    Objective: Vitals:   09/09/17 0620 09/09/17 0845 09/09/17 0919 09/09/17 0925  BP: 138/89 (!) 146/76    Pulse: 100 99 100 98  Resp: _0 Temp: 98.7 F (37.1 C) 98.6 F (37 C)    TempSrc: Oral Oral    SpO2: 91% 92% 93%   Weight:      Height:        Intake/Output Summary (Last 24 hours) at 09/09/2017 0942 Last data filed at 09/09/2017 0900 Gross per 24 hour  Intake 802 ml  Output 500 ml  Net 302 ml   Filed Weights   09/05/17 0838 09/05/17 1540  Weight: 82.6 kg (182 lb) 84 kg (185 lb 3 oz)    Examination:  General exam:  Adult female.  No acute distress.  Nasal canula in place.  Lethargic, slow to answer questions,  difficult to stay awake, difficulty following commands HEENT:  NCAT, MMM Respiratory system:  More diminished on the left chest today anteriorly compared to yesterday.  Rales anterior left chest.  Faint wheeze, no rhonchi Cardiovascular system: Regular rate and rhythm, normal S1/S2. No murmurs, rubs, gallops or clicks.  Warm extremities Gastrointestinal system: Normal active bowel sounds, soft, nondistended, nontender. MSK:  Normal tone and bulk, no lower extremity edema Neuro:  Symmetric smile, tongue midline, pupils reactive.  Able to wiggle toes on both feet and fingers on both hands after repeated commands to do so.    Data Reviewed: I have personally reviewed following labs and imaging studies  CBC: Recent Labs  Lab 09/05/17 0858  09/06/17 0950  09/06/17 2332 09/07/17 0455 09/07/17 1411 09/08/17 0407 09/09/17 0443  WBC 12.9*  --  17.0*  --   --  14.0*  --  13.8* 19.1*  NEUTROABS 10.6*  --  14.4*  --   --   --   --   --   --   HGB 9.4*   < > 8.5*   < > 7.5* 7.4* 7.8* 7.2* 9.4*  HCT 28.5*   < > 26.9*   < > 23.9* 23.6* 25.2* 23.3* 30.4*  MCV 91.3  --  91.2  --   --  92.9  --  92.5 93.0  PLT 144*  --  164  --   --  138*  --  162 226   < > = values in this interval not displayed.   Basic Metabolic Panel: Recent Labs  Lab 09/06/17 0406 09/06/17 0950 09/07/17 0455 09/08/17 0407 09/09/17 0443  NA 134* 133* 137 138 137  K 3.5 3.7 3.6 2.9* 4.4  CL 103 102 108 107 103  CO2 21* 20* 21* 25 26  GLUCOSE 129* 194* 96 113* 144*  BUN _0 <5*  CREATININE 0.57 0.63 0.48 0.51 0.43*  CALCIUM 7.8* 7.7* 7.5* 7.6* 8.1*  MG  --   --   --  2.2  --    GFR: Estimated Creatinine Clearance: 58.3 mL/min (A) (by C-G formula based on SCr of 0.43 mg/dL (L)). Liver Function Tests: Recent Labs  Lab 09/05/17 0858 09/06/17 0950  AST 23 21  ALT 11* 10*  ALKPHOS 56 60  BILITOT 1.3* 1.1  PROT 5.5* 5.2*  ALBUMIN 2.8* 2.3*   No results for input(s): LIPASE, AMYLASE in the last 168  hours. No results for input(s): AMMONIA in the last 168 hours. Coagulation Profile: Recent Labs  Lab 09/06/17 0950  INR 1.30   Cardiac Enzymes: No results for input(s): CKTOTAL, CKMB, CKMBINDEX, TROPONINI in the last 168 hours. BNP (last 3 results) No results for input(s): PROBNP in the last 8760 hours. HbA1C: No results for input(s): HGBA1C in the last 72 hours. CBG: Recent Labs  Lab 09/05/17 0847 09/09/17 0203  GLUCAP 145* 132*   Lipid Profile: No results for input(s): CHOL, HDL, LDLCALC, TRIG, CHOLHDL, LDLDIRECT in the last 72 hours. Thyroid Function Tests: No results for input(s): TSH, T4TOTAL, FREET4, T3FREE, THYROIDAB in the last 72 hours. Anemia Panel: No results for input(s): VITAMINB12, FOLATE, FERRITIN, TIBC, IRON, RETICCTPCT in the last 72 hours. Urine analysis:    Component Value Date/Time   COLORURINE YELLOW 09/06/2017 0721   APPEARANCEUR HAZY (A) 09/06/2017 0721   LABSPEC 1.023 09/06/2017 0721   PHURINE 5.0 09/06/2017 0721   GLUCOSEU NEGATIVE 09/06/2017 0721   HGBUR NEGATIVE 09/06/2017 0721   BILIRUBINUR NEGATIVE 09/06/2017 0721   KETONESUR 20 (A) 09/06/2017 0721   PROTEINUR NEGATIVE 09/06/2017 0721   UROBILINOGEN 0.2 10/20/2007 2015   NITRITE POSITIVE (A) 09/06/2017 0721   LEUKOCYTESUR NEGATIVE 09/06/2017 0721   Sepsis Labs: _1 (procalcitonin:4,lacticidven:4)  ) Recent Results (from the past 240 hour(s))  Culture, Urine     Status: Abnormal   Collection Time: 09/06/17  9:34 AM  Result Value Ref Range Status   Specimen Description URINE, CLEAN CATCH  Final   Special Requests   Final    NONE Performed at [facility], [place] [address] [place], [place] [address]    Culture MULTIPLE SPECIES PRESENT, SUGGEST RECOLLECTION (A)  Final   Report Status 09/07/2017 FINAL  Final  Culture, blood (Routine X 2) w Reflex to ID Panel     Status: None (Preliminary result)  Collection Time: 09/06/17  9:40 AM  Result Value Ref Range Status    Specimen Description BLOOD RIGHT ANTECUBITAL  Final   Special Requests   Final    IN PEDIATRIC BOTTLE Blood Culture results may not be optimal due to an excessive volume of blood received in culture bottles   Culture   Final    NO GROWTH 2 DAYS Performed at [facility], [address] [place], [place] [address]    Report Status PENDING  Incomplete  Culture, blood (Routine X 2) w Reflex to ID Panel     Status: None (Preliminary result)   Collection Time: 09/06/17  9:50 AM  Result Value Ref Range Status   Specimen Description BLOOD RIGHT ANTECUBITAL  Final   Special Requests   Final    BOTTLES DRAWN AEROBIC AND ANAEROBIC Blood Culture adequate volume   Culture   Final    NO GROWTH 2 DAYS Performed at [facility], [address] [place], [place] [address]    Report Status PENDING  Incomplete  MRSA PCR Screening     Status: None   Collection Time: 09/06/17 11:58 AM  Result Value Ref Range Status   MRSA by PCR NEGATIVE NEGATIVE Final    Comment:        The GeneXpert MRSA Assay (FDA approved for NASAL specimens only), is one component of a comprehensive MRSA colonization surveillance program. It is not intended to diagnose MRSA infection nor to guide or monitor treatment for MRSA infections. Performed at [facility], [address] [place],  [address]       Radiology Studies: Dg Chest Port 1 View  Result Date: 09/09/2017 CLINICAL DATA:  Shortness of breath and wheezing EXAM: PORTABLE CHEST 1 VIEW COMPARISON:  September 06, 2017 FINDINGS: There is airspace consolidation throughout the left mid lower lung zones with small left pleural effusion. There is new patchy infiltrate in the right base and to a lesser extent in the right upper lobe. Heart is upper normal in size with pulmonary vascularity within normal limits. No adenopathy. There is aortic atherosclerosis. No evident bone lesions. IMPRESSION: Persistent airspace consolidation throughout portions of  the left mid lower lung zones with small left pleural effusion. New patchy infiltrate in the right upper lobe and right base regions. Suspect multifocal pneumonia. Stable cardiac silhouette. There is aortic atherosclerosis. Aortic Atherosclerosis (ICD10-I70.0). Electronically Signed   By: William  Woodruff III M.D.   On: 09/09/2017 09:35     Scheduled Meds: . acidophilus  1 capsule Oral Daily  . albuterol  3 mL Inhalation BID  . cholecalciferol  2,000 Units Oral BID  . dorzolamide-timolol  1 drop Both Eyes BID  . fluticasone  2 spray Each Nare Daily  . furosemide  40 mg Intravenous Once  . loratadine  10 mg Oral Daily  . metoprolol tartrate  2.5 mg Intravenous Q6H  . multivitamin with minerals  1 tablet Oral Daily  . pantoprazole  40 mg Oral Daily  . polyethylene glycol  17 g Oral Daily  . sertraline  100 mg Oral Daily  . simvastatin  20 mg Oral Daily  . sodium chloride flush  3 mL Intravenous Q12H  . vitamin C  1,000 mg Oral Daily   Continuous Infusions: . sodium chloride    . azithromycin Stopped (09/08/17 0948)     LOS: 4 days    Time spent: 30 min    Mackenzie Short, MD [facility] Pager [phone]    If 7PM-7AM, please contact night-coverage [email or web address] Password [email or web address] 09/09/2017, 9:42 AM          

## 2017-09-09 NOTE — Progress Notes (Signed)
Pharmacy Antibiotic Note  Betty [name] is a 77 y.o. female admitted on 09/05/2017 with pneumonia.  Pharmacy has been consulted for Zosyn dosing. Previously on Unasyn/azithro. Unasyn has been d/c'd. Tmax/24h 100.4, wbc up to 19.1. Noted hives allergy to cefadroxil but has tolerated Unasyn this admit.  Plan: Zosyn 3.375g IV q8h (4h infusion) Azithro 500mg  IV q24h per MD Monitor clinical progress, c/s, renal function F/u de-escalation plan/LOT Rx will s/o consult and monitor peripherally  Height: 5\' 2"  (157.5 cm) Weight: 185 lb 3 oz (84 kg) IBW/kg (Calculated) : 50.1  Temp (24hrs), Avg:99.2 F (37.3 C), [name]:98.6 F (37 C), Max:100.4 F (38 C)  Recent Labs  Lab 09/05/17 0858 09/06/17 0406 09/06/17 0950 09/06/17 1520 09/07/17 0455 09/08/17 0407 09/09/17 0443  WBC 12.9*  --  17.0*  --  14.0* 13.8* 19.1*  CREATININE 0.64 0.57 0.63  --  0.48 0.51 0.43*  LATICACIDVEN  --   --  2.1* 1.8  --   --   --     Estimated Creatinine Clearance: 58.3 mL/[name] (A) (by C-G formula based on SCr of 0.43 mg/dL (L)).    Allergies  Allergen Reactions  . Morphine And Related Itching  . Cefadroxil Hives    Patient can take amoxicillin and cipro    Elicia Lamp, PharmD, BCPS Clinical Pharmacist Clinical phone for 09/09/2017 until 3:30pm: [phone] If after 3:30pm, please call main pharmacy at: [phone] 09/09/2017 9:57 AM

## 2017-09-09 NOTE — Care Management Important Message (Signed)
Important Message  Patient Details  Name: [name] MRN: [id number] Date of Birth: 08-19-1940   Medicare Important Message Given:  Yes    Orbie Pyo 09/09/2017, 12:09 PM

## 2017-09-10 ENCOUNTER — Inpatient Hospital Stay (HOSPITAL_COMMUNITY): Payer: Medicare HMO

## 2017-09-10 DIAGNOSIS — R4182 Altered mental status, unspecified: Secondary | ICD-10-CM

## 2017-09-10 DIAGNOSIS — J189 Pneumonia, unspecified organism: Secondary | ICD-10-CM

## 2017-09-10 LAB — CBC
HEMATOCRIT: 30.4 % — AB (ref 36.0–46.0)
HEMOGLOBIN: 9.3 g/dL — AB (ref 12.0–15.0)
MCH: 28.9 pg (ref 26.0–34.0)
MCHC: 30.6 g/dL (ref 30.0–36.0)
MCV: 94.4 fL (ref 78.0–100.0)
Platelets: 217 10*3/uL (ref 150–400)
RBC: 3.22 MIL/uL — AB (ref 3.87–5.11)
RDW: 16.6 % — ABNORMAL HIGH (ref 11.5–15.5)
WBC: 13.3 10*3/uL — ABNORMAL HIGH (ref 4.0–10.5)

## 2017-09-10 LAB — BASIC METABOLIC PANEL
ANION GAP: 11 (ref 5–15)
BUN: 6 mg/dL (ref 6–20)
CALCIUM: 8.4 mg/dL — AB (ref 8.9–10.3)
CHLORIDE: 98 mmol/L — AB (ref 101–111)
CO2: 29 mmol/L (ref 22–32)
Creatinine, Ser: 0.49 mg/dL (ref 0.44–1.00)
GFR calc non Af Amer: >60 mL/min (ref >60)
Glucose, Bld: 98 mg/dL (ref 65–99)
Potassium: 3.6 mmol/L (ref 3.5–5.1)
Sodium: 138 mmol/L (ref 135–145)

## 2017-09-10 LAB — MAGNESIUM: MAGNESIUM: 2.3 mg/dL (ref 1.7–2.4)

## 2017-09-10 MED ORDER — METOPROLOL TARTRATE 5 MG/5ML IV SOLN
2.5000 mg | Freq: Four times a day (QID) | INTRAVENOUS | Status: DC | PRN
  Administered 2017-09-10: 2.5 mg via INTRAVENOUS
  Filled 2017-09-10: qty 5

## 2017-09-10 MED ORDER — PROPRANOLOL HCL 20 MG PO TABS
20.0000 mg | ORAL_TABLET | Freq: Every day | ORAL | Status: DC
  Administered 2017-09-11 – 2017-09-14 (×4): 20 mg via ORAL
  Filled 2017-09-10 (×4): qty 1

## 2017-09-10 MED ORDER — FUROSEMIDE 10 MG/ML IJ SOLN
60.0000 mg | Freq: Once | INTRAMUSCULAR | Status: AC
  Administered 2017-09-10: 60 mg via INTRAVENOUS
  Filled 2017-09-10: qty 6

## 2017-09-10 NOTE — Evaluation (Signed)
Clinical/Bedside Swallow Evaluation Patient Details  Name: [name] MRN: [id number] Date of Birth: 1940/12/22  Today's Date: 09/10/2017 Time: SLP Start Time (ACUTE ONLY): 1320 SLP Stop Time (ACUTE ONLY): 1350 SLP Time Calculation (min) (ACUTE ONLY): 30 min  Past Medical History:  Past Medical History:  Diagnosis Date  . Anxiety   . Arthritis    "bilateral knees, shoulders, elbows; neck, pretty widespread" (09/05/2017)  . BPPV (benign paroxysmal positional vertigo)   . Depression   . GERD (gastroesophageal reflux disease)   . Glaucoma, both eyes   . Headache    "probably 2/month" (09/05/2017)  . History of blood transfusion ~ 2008   "related to LGIB"  . Hyperlipemia   . Lower GI bleeding ~ 2008; 09/05/2017   "had to have blood transfusion"  . Macular degeneration, bilateral   . Osteopenia   . Seasonal allergies   . Skin cancer, basal cell 2001   "off my nose, left side"  . Sleeping excessive   . Tinnitus of both ears    Past Surgical History:  Past Surgical History:  Procedure Laterality Date  . BASAL CELL CARCINOMA EXCISION  2001   "off my nose, left side"  . BLEPHAROPLASTY Bilateral   . CATARACT EXTRACTION W/ INTRAOCULAR LENS  IMPLANT, BILATERAL Bilateral 1990's  . EYE SURGERY Bilateral    "to improve vision after cataract OR"  . JOINT REPLACEMENT    . STAPEDES SURGERY Left    "scraped stapedes because it was sticking when it wasn't suppose to"  . TONSILLECTOMY AND ADENOIDECTOMY  1946  . TOTAL KNEE ARTHROPLASTY Left ~ 2008  . TOTAL KNEE ARTHROPLASTY  12/20/2011   Procedure: TOTAL KNEE ARTHROPLASTY;  Surgeon: Augustin Schooling, MD;  Location: [facility];  Service: Orthopedics;  Laterality: Right;  Right Total Knee Arthroplasty  . TUBAL LIGATION  1980's   HPI:  77 y.o. female with significant PMH of TKA, osteopenia, HTN, frequent falls, BPPV who was admitted for rectal bleeding. She was found to be febrile with tachycardia and CXR revealed PNA in left lower lobe.    Assessment / Plan / Recommendation Clinical Impression   Pt presents with s/s of a chronic pharyngoesophageal dysphagia with pt's primary complaint being globus sensation following intake of both solids and liquids.  Pt's voice remained clear and strong before and after intake and she demonstrated no overt s/s of aspiration.  Oral phase was timely with complete clearance of boluses from the oral cavity and no significant residuals post swallow.  Pt reports that her dysphagia predates her acute illness and she has been tolerating a regular diet and thin liquids at home.  As a result, recommend that pt remain on regular solids, thin liquids.  No further ST needs warranted at this time.    SLP Visit Diagnosis: Dysphagia, pharyngoesophageal phase (R13.14)    Aspiration Risk  Mild aspiration risk    Diet Recommendation Regular;Thin liquid   Liquid Administration via: Cup;Straw Medication Administration: Whole meds with liquid(or puree) Supervision: Patient able to self feed;Intermittent supervision to cue for compensatory strategies Compensations: Minimize environmental distractions;Slow rate;Small sips/bites;Follow solids with liquid Postural Changes: Seated upright at 90 degrees;Remain upright for at least 30 minutes after po intake    Other  Recommendations Oral Care Recommendations: Oral care BID   Follow up Recommendations None      Frequency and Duration            Prognosis        Swallow Study   General  Date of Onset: (see HPI) HPI: 77 y.o. female with significant PMH of TKA, osteopenia, HTN, frequent falls, BPPV who was admitted for rectal bleeding. She was found to be febrile with tachycardia and CXR revealed PNA in left lower lobe. Type of Study: Bedside Swallow Evaluation Previous Swallow Assessment: none on record, esophagram on 12/12/2016 Diet Prior to this Study: Regular;Thin liquids Temperature Spikes Noted: No Respiratory Status: Nasal cannula(high flow) History  of Recent Intubation: No Behavior/Cognition: Alert;Cooperative;Pleasant mood Oral Cavity Assessment: Within Functional Limits Oral Care Completed by SLP: No Oral Cavity - Dentition: Adequate natural dentition Vision: Functional for self-feeding Self-Feeding Abilities: Able to feed self Patient Positioning: Upright in chair Baseline Vocal Quality: Normal Volitional Cough: Weak    Oral/Motor/Sensory Function Overall Oral Motor/Sensory Function: Within functional limits   Ice Chips     Thin Liquid Thin Liquid: Within functional limits    Nectar Thick     Honey Thick     Puree     Solid   GO   Solid: Within functional limits        Keilan Nichol, Elmyra Ricks L 09/10/2017,1:58 PM

## 2017-09-10 NOTE — Progress Notes (Signed)
Pt currently on HFNC @ 8 Lpm,  SPO2  98%-100%.

## 2017-09-10 NOTE — Progress Notes (Signed)
Physical Therapy Treatment Patient Details Name: [name] MRN: [id number] DOB: [birth year] Today's Date: 09/10/2017    History of Present Illness 77 y.o. female with significant PMH of TKA, osteopenia, HTN, frequent falls, BPPV who was admitted for rectal bleeding. She was found to be febrile with tachycardia and CXR revealed PNA in left lower lobe.    PT Comments    Pt requiring increased assist compared to last session, likely due to pt being immobile in bed for past couple of days. Mod to max assist for balance demonstrating posterior bias during functional mobility. Pt motivated to participate to regain independence, but is very fearful of falling during standing and ambulation. VSS throughout session with SpO2 97-100% on 6L HFNC. Will continue to follow acutely and progress as tolerated.   Follow Up Recommendations  SNF     Equipment Recommendations  Other (comment)(defer to next venue of care)    Recommendations for Other Services       Precautions / Restrictions Precautions Precautions: Fall Restrictions Weight Bearing Restrictions: No    Mobility  Bed Mobility Overal bed mobility: Needs Assistance Bed Mobility: Supine to Sit     Supine to sit: Min assist     General bed mobility comments: min assist for elevating trunk to upright and scooting to EOB with bed pad. HOB elevated to 30 degrees and use of bed rail. VCs for sequencing. increased effort and time required. pt with some light headedness and possible double vision sitting at EOB that resolved after ~1 min.  Transfers Overall transfer level: Needs assistance Equipment used: Rolling walker (2 wheeled) Transfers: Sit to/from Stand Sit to Stand: Mod assist;Min assist;From elevated surface         General transfer comment: Mod assist for boost up with increased effort and time to rise from EOB. Min assist with bed elevated. required assistance and VCs in standing for balance to prevent fall. strong  posterior lean noted. VCs for hand placement and anterior translation.  Ambulation/Gait Ambulation/Gait assistance: Max assist;+2 safety/equipment(+2 for chair follow) Ambulation Distance (Feet): 3 Feet Assistive device: Rolling walker (2 wheeled) Gait Pattern/deviations: Step-to pattern;Decreased stride length;Decreased weight shift to right;Decreased weight shift to left;Shuffle;Leaning posteriorly;Narrow base of support Gait velocity: decreased Gait velocity interpretation: Below normal speed for age/gender General Gait Details: pt requiring max assist to ambute with RW for balance to prevent fall due to posterior lean. pt with increased fatigue and bil LE buckling after a few steps. demonstrates short, shuffling steps. assist required for managament of RW.    Stairs            Wheelchair Mobility    Modified Rankin (Stroke Patients Only)       Balance Overall balance assessment: Needs assistance Sitting-balance support: No upper extremity supported Sitting balance-Leahy Scale: Fair     Standing balance support: Bilateral upper extremity supported;During functional activity Standing balance-Leahy Scale: Poor Standing balance comment: pt with posterior bias requiring mulimodal cues and assist to maintain balance with bil UEs on RW. pt responds well to cues to put weight through toes, stick chest out, tuck in bottom but does not maintain without continuous cues and fatigues easily                            Cognition Arousal/Alertness: Awake/alert Behavior During Therapy: WFL for tasks assessed/performed Overall Cognitive Status: Within Functional Limits for tasks assessed  Exercises      General Comments General comments (skin integrity, edema, etc.): VSS. SpO2 97-100% throughout session on 6L HFNC      Pertinent Vitals/Pain Pain Assessment: No/denies pain    Home Living                       Prior Function            PT Goals (current goals can now be found in the care plan section) Acute Rehab PT Goals Patient Stated Goal: Get strong again PT Goal Formulation: With patient Time For Goal Achievement: 09/22/17 Potential to Achieve Goals: Good Progress towards PT goals: Not progressing toward goals - comment(likely due to being immobile for past 2 days)    Frequency    Min 2X/week      PT Plan Current plan remains appropriate    Co-evaluation              AM-PAC PT "6 Clicks" Daily Activity  Outcome Measure  Difficulty turning over in bed (including adjusting bedclothes, sheets and blankets)?: Unable Difficulty moving from lying on back to sitting on the side of the bed? : Unable Difficulty sitting down on and standing up from a chair with arms (e.g., wheelchair, bedside commode, etc,.)?: Unable Help needed moving to and from a bed to chair (including a wheelchair)?: A Little Help needed walking in hospital room?: A Lot Help needed climbing 3-5 steps with a railing? : A Lot 6 Click Score: 10    End of Session Equipment Utilized During Treatment: Gait belt Activity Tolerance: Patient tolerated treatment well Patient left: in chair;with call bell/phone within reach;with family/visitor present Nurse Communication: Mobility status PT Visit Diagnosis: Unsteadiness on feet (R26.81);History of falling (Z91.81)     Time: 5102-5852 PT Time Calculation (min) (ACUTE ONLY): 30 min  Charges:  $Therapeutic Activity: 23-37 mins                    G Codes:       Vic Ripper, SPT   Vic Ripper 09/10/2017, 12:47 PM

## 2017-09-10 NOTE — Social Work (Signed)
Pt is on 4NP, CSW aware pt will likely still want/need SNF on discharge.  CSW continuing to follow when pt is medically ready for discharge.  Alexander [place], [place] Work [phone]

## 2017-09-10 NOTE — Progress Notes (Signed)
Patient wore BiPAP all night and is requesting a small break this morning. Gave patient mouth care and lip moisturizer. Gave patient sips with Tylenol, see EMAR

## 2017-09-10 NOTE — Progress Notes (Signed)
Patient ID: [name], female   DOB: Dec 26, 1940, 77 y.o.   MRN: [id number]  PROGRESS NOTE    [name]  UXN:[id number] DOB: 1941/05/10 DOA: 09/05/2017 PCP: [name], MD   Brief Narrative:  77 year old female with history of anxiety, depression, GERD, arthritis, tobacco abuse and diverticulosis with multiple episodes of diverticular bleeding including most recently in April 2018 presented on 09/05/2017 with bright red blood per rectum and drop in her hemoglobin from 12.9 to 9.4.  GI was consulted.  Overnight from 09/05/2017-09/06/2017, she spiked a fever to 102.6 and became tachycardic.  She was started on antibiotics for pneumonia.  Assessment & Plan:   Active Problems:   Diverticulosis of intestine with bleeding   Acute hypoxic and hypercapnic respiratory failure due to RLL pneumonia -Improving.  Wean off oxygen as able.  Currently on 6 L/min oxygen via nasal cannula.   -Off BiPAP  -Patient has a positive fluid balance of 2247.4 mL since admission.  Will give 1 dose of Lasix and monitor urine output. -Repeat chest x-ray for tomorrow  -Echo pending  Multifocal pneumonia with bilateral pleural effusion probable continue Unasyn and azithromycin -Initially was on Unasyn and Zithromax which was switched to Zosyn and Zithromax on 09/10/2011.  Cultures negative so far - Continue albuterol -Urine streptococcal antigen negative.  Acute metabolic encephalopathy, likely due to hypercapnea.   -Improving.  Monitor mental status  Sepsis -Cultures negative so far.  Hemodynamically stable. -Antibiotic plan as above  Leukocytosis -Improving.  Repeat a.m. labs  Acute blood loss anemia secondary to diverticular bleeding -Hemoglobin 9.3 today.  Bleeding has probably stopped.  GI has signed off. -Transfused 1 unit PRBC on 09/08/17  Hyperlipidemia, Stable, continue simvastatin  Glaucoma with macular degeneration, stable, continued eyedrops  Hypertension -Pressure stable.  Monitor    Hypokalemia -Resolved   DVT prophylaxis: SCDs Code Status: Full Family Communication: None at bedside Disposition Plan: Depends on clinical outcome  Consultants: GI  Procedures: None  Antimicrobials:  Anti-infectives (From admission, onward)   Start     Dose/Rate Route Frequency Ordered Stop   09/09/17 1000  piperacillin-tazobactam (ZOSYN) IVPB 3.375 g     3.375 g 12.5 mL/hr over 240 Minutes Intravenous Every 8 hours 09/09/17 0959     09/07/17 0800  levofloxacin (LEVAQUIN) IVPB 500 mg  Status:  Discontinued     500 mg 100 mL/hr over 60 Minutes Intravenous Every 24 hours 09/06/17 0748 09/06/17 1238   09/07/17 0800  azithromycin (ZITHROMAX) 500 mg in sodium chloride 0.9 % 250 mL IVPB     500 mg 250 mL/hr over 60 Minutes Intravenous Every 24 hours 09/06/17 1238     09/06/17 2200  vancomycin (VANCOCIN) IVPB 750 mg/150 ml premix  Status:  Discontinued     750 mg 150 mL/hr over 60 Minutes Intravenous Every 12 hours 09/06/17 0748 09/06/17 1136   09/06/17 1600  ampicillin-sulbactam (UNASYN) 1.5 g in sodium chloride 0.9 % 100 mL IVPB  Status:  Discontinued     1.5 g 200 mL/hr over 30 Minutes Intravenous Every 6 hours 09/06/17 1340 09/09/17 0942   09/06/17 1400  aztreonam (AZACTAM) 2 g in sodium chloride 0.9 % 100 mL IVPB  Status:  Discontinued     2 g 200 mL/hr over 30 Minutes Intravenous Every 8 hours 09/06/17 0748 09/06/17 1136   09/06/17 0800  levofloxacin (LEVAQUIN) IVPB 750 mg     750 mg 100 mL/hr over 90 Minutes Intravenous  Once 09/06/17 0723 09/06/17 1000   09/06/17  0730  aztreonam (AZACTAM) 2 g in sodium chloride 0.9 % 100 mL IVPB     2 g 200 mL/hr over 30 Minutes Intravenous  Once 09/06/17 0723 09/06/17 0850   09/06/17 0730  vancomycin (VANCOCIN) 1,500 mg in sodium chloride 0.9 % 500 mL IVPB     1,500 mg 250 mL/hr over 120 Minutes Intravenous  Once 09/06/17 0723 09/06/17 1309        Subjective: Patient seen and examined at bedside.  She does not feel well but  feels slightly better than yesterday.  No overnight fever or vomiting.  She is still coughing and short of breath.  Objective: Vitals:   09/10/17 0800 09/10/17 0811 09/10/17 0834 09/10/17 1140  BP: 119/64  119/64 122/70  Pulse: 95  89 97  Resp: 16  16 18   Temp:  98.1 F (36.7 C)  97.9 F (36.6 C)  TempSrc:  Oral  Oral  SpO2: 100%  100% 100%  Weight:      Height:        Intake/Output Summary (Last 24 hours) at 09/10/2017 1328 Last data filed at 09/10/2017 1058 Gross per 24 hour  Intake 350 ml  Output -  Net 350 ml   Filed Weights   09/05/17 0838 09/05/17 1540 09/09/17 1030  Weight: 82.6 kg (182 lb) 84 kg (185 lb 3 oz) 86.7 kg (191 lb 2.2 oz)    Examination:  General exam: Elderly female sitting on chair, slightly short of breath Respiratory system: Bilateral decreased breath sound at bases with scattered crackles Cardiovascular system: S1 & S2 heard, rate controlled  gastrointestinal system: Abdomen is nondistended, soft and nontender. Normal bowel sounds heard. Central nervous system: Alert and oriented. No focal neurological deficits. Moving extremities Extremities: No cyanosis, clubbing; trace edema  Skin: No rashes, lesions or ulcers Lymph: No cervical lymphadenopathy    Data Reviewed: I have personally reviewed following labs and imaging studies  CBC: Recent Labs  Lab 09/05/17 0858  09/06/17 0950  09/07/17 0455 09/07/17 1411 09/08/17 0407 09/09/17 0443 09/09/17 0938 09/10/17 0412  WBC 12.9*  --  17.0*  --  14.0*  --  13.8* 19.1* 18.3* 13.3*  NEUTROABS 10.6*  --  14.4*  --   --   --   --   --   --   --   HGB 9.4*   < > 8.5*   < > 7.4* 7.8* 7.2* 9.4* 9.4* 9.3*  HCT 28.5*   < > 26.9*   < > 23.6* 25.2* 23.3* 30.4* 30.1* 30.4*  MCV 91.3  --  91.2  --  92.9  --  92.5 93.0 93.2 94.4  PLT 144*  --  164  --  138*  --  162 226 220 217   < > = values in this interval not displayed.   Basic Metabolic Panel: Recent Labs  Lab 09/07/17 0455 09/08/17 0407  09/09/17 0443 09/09/17 0938 09/10/17 0412  NA 137 138 137 138 138  K 3.6 2.9* 4.4 4.5 3.6  CL 108 107 103 102 98*  CO2 21* 25 26 26 29   GLUCOSE 96 113* 144* 134* 98  BUN 13 6 <5* <5* 6  CREATININE 0.48 0.51 0.43* 0.44 0.49  CALCIUM 7.5* 7.6* 8.1* 8.1* 8.4*  MG  --  2.2  --   --  2.3   GFR: Estimated Creatinine Clearance: 59.2 mL/min (by C-G formula based on SCr of 0.49 mg/dL). Liver Function Tests: Recent Labs  Lab 09/05/17 0858 09/06/17 0950 09/09/17  3716  AST 23 21 19   ALT 11* 10* 13*  ALKPHOS 56 60 84  BILITOT 1.3* 1.1 0.3  PROT 5.5* 5.2* 5.4*  ALBUMIN 2.8* 2.3* 2.2*   No results for input(s): LIPASE, AMYLASE in the last 168 hours. Recent Labs  Lab 09/09/17 0943  AMMONIA 33   Coagulation Profile: Recent Labs  Lab 09/06/17 0950  INR 1.30   Cardiac Enzymes: Recent Labs  Lab 09/09/17 0938  TROPONINI <0.03   BNP (last 3 results) No results for input(s): PROBNP in the last 8760 hours. HbA1C: No results for input(s): HGBA1C in the last 72 hours. CBG: Recent Labs  Lab 09/05/17 0847 09/09/17 0203  GLUCAP 145* 132*   Lipid Profile: No results for input(s): CHOL, HDL, LDLCALC, TRIG, CHOLHDL, LDLDIRECT in the last 72 hours. Thyroid Function Tests: No results for input(s): TSH, T4TOTAL, FREET4, T3FREE, THYROIDAB in the last 72 hours. Anemia Panel: No results for input(s): VITAMINB12, FOLATE, FERRITIN, TIBC, IRON, RETICCTPCT in the last 72 hours. Sepsis Labs: Recent Labs  Lab 09/06/17 0950 09/06/17 1520 09/09/17 0938  PROCALCITON 2.13  --   --   LATICACIDVEN 2.1* 1.8 0.7    Recent Results (from the past 240 hour(s))  Culture, Urine     Status: Abnormal   Collection Time: 09/06/17  9:34 AM  Result Value Ref Range Status   Specimen Description URINE, CLEAN CATCH  Final   Special Requests   Final    NONE Performed at [facility], [place] [address] [place], [place] [address]    Culture MULTIPLE SPECIES PRESENT, SUGGEST RECOLLECTION (A)  Final    Report Status 09/07/2017 FINAL  Final  Culture, blood (Routine X 2) w Reflex to ID Panel     Status: None (Preliminary result)   Collection Time: 09/06/17  9:40 AM  Result Value Ref Range Status   Specimen Description BLOOD RIGHT ANTECUBITAL  Final   Special Requests   Final    IN PEDIATRIC BOTTLE Blood Culture results may not be optimal due to an excessive volume of blood received in culture bottles   Culture   Final    NO GROWTH 3 DAYS Performed at [facility], [place] [address] [place], [place] [address]    Report Status PENDING  Incomplete  Culture, blood (Routine X 2) w Reflex to ID Panel     Status: None (Preliminary result)   Collection Time: 09/06/17  9:50 AM  Result Value Ref Range Status   Specimen Description BLOOD RIGHT ANTECUBITAL  Final   Special Requests   Final    BOTTLES DRAWN AEROBIC AND ANAEROBIC Blood Culture adequate volume   Culture   Final    NO GROWTH 3 DAYS Performed at [facility], [place] [address], [place], [place] [address]    Report Status PENDING  Incomplete  MRSA PCR Screening     Status: None   Collection Time: 09/06/17 11:58 AM  Result Value Ref Range Status   MRSA by PCR NEGATIVE NEGATIVE Final    Comment:        The GeneXpert MRSA Assay (FDA approved for NASAL specimens only), is one component of a comprehensive MRSA colonization surveillance program. It is not intended to diagnose MRSA infection nor to guide or monitor treatment for MRSA infections. Performed at [facility], [place] [address], [place], [place] [address]          Radiology Studies: Dg Chest Port 1 View  Result Date: 09/10/2017 CLINICAL  DATA:  Follow-up pneumonia EXAM: PORTABLE CHEST 1 VIEW COMPARISON:  09/09/2017 FINDINGS: Cardiac shadow is stable. Left pleural effusion and underlying infiltrate is again noted stable from the prior exam. Some slight increase in right-sided pleural effusion is noted as well. No bony abnormality is seen. IMPRESSION:  Increasing bibasilar changes left greater than right Electronically Signed   By: Inez Catalina M.D.   On: 09/10/2017 08:15   Dg Chest Port 1 View  Result Date: 09/09/2017 CLINICAL DATA:  Shortness of breath and wheezing EXAM: PORTABLE CHEST 1 VIEW COMPARISON:  September 06, 2017 FINDINGS: There is airspace consolidation throughout the left mid lower lung zones with small left pleural effusion. There is new patchy infiltrate in the right base and to a lesser extent in the right upper lobe. Heart is upper normal in size with pulmonary vascularity within normal limits. No adenopathy. There is aortic atherosclerosis. No evident bone lesions. IMPRESSION: Persistent airspace consolidation throughout portions of the left mid lower lung zones with small left pleural effusion. New patchy infiltrate in the right upper lobe and right base regions. Suspect multifocal pneumonia. Stable cardiac silhouette. There is aortic atherosclerosis. Aortic Atherosclerosis (ICD10-I70.0). Electronically Signed   By: Lowella Grip III M.D.   On: 09/09/2017 09:35        Scheduled Meds: . acidophilus  1 capsule Oral Daily  . albuterol  3 mL Inhalation BID  . chlorhexidine  15 mL Mouth Rinse BID  . cholecalciferol  2,000 Units Oral BID  . dorzolamide-timolol  1 drop Both Eyes BID  . fluticasone  2 spray Each Nare Daily  . loratadine  10 mg Oral Daily  . mouth rinse  15 mL Mouth Rinse q12n4p  . metoprolol tartrate  2.5 mg Intravenous Q6H  . multivitamin with minerals  1 tablet Oral Daily  . pantoprazole  40 mg Oral Daily  . polyethylene glycol  17 g Oral Daily  . sertraline  100 mg Oral Daily  . simvastatin  20 mg Oral Daily  . sodium chloride flush  3 mL Intravenous Q12H  . vitamin C  1,000 mg Oral Daily   Continuous Infusions: . sodium chloride    . azithromycin Stopped (09/10/17 1058)  . piperacillin-tazobactam (ZOSYN)  IV 3.375 g (09/10/17 1251)     LOS: 5 days        Aline August, MD [facility] Pager [phone]  If 7PM-7AM, please contact night-coverage [email or web address] Password [email or web address] 09/10/2017, 1:28 PM

## 2017-09-11 ENCOUNTER — Inpatient Hospital Stay (HOSPITAL_COMMUNITY): Payer: Medicare HMO

## 2017-09-11 LAB — BASIC METABOLIC PANEL
ANION GAP: 9 (ref 5–15)
BUN: 5 mg/dL — AB (ref 6–20)
CALCIUM: 8.4 mg/dL — AB (ref 8.9–10.3)
CO2: 38 mmol/L — AB (ref 22–32)
CREATININE: 0.46 mg/dL (ref 0.44–1.00)
Chloride: 91 mmol/L — ABNORMAL LOW (ref 101–111)
GFR calc Af Amer: >60 mL/min (ref >60)
GLUCOSE: 101 mg/dL — AB (ref 65–99)
Potassium: 3.2 mmol/L — ABNORMAL LOW (ref 3.5–5.1)
Sodium: 138 mmol/L (ref 135–145)

## 2017-09-11 LAB — CBC
HCT: 31.8 % — ABNORMAL LOW (ref 36.0–46.0)
Hemoglobin: 9.9 g/dL — ABNORMAL LOW (ref 12.0–15.0)
MCH: 29.5 pg (ref 26.0–34.0)
MCHC: 31.1 g/dL (ref 30.0–36.0)
MCV: 94.6 fL (ref 78.0–100.0)
PLATELETS: 218 10*3/uL (ref 150–400)
RBC: 3.36 MIL/uL — ABNORMAL LOW (ref 3.87–5.11)
RDW: 16 % — AB (ref 11.5–15.5)
WBC: 11.4 10*3/uL — ABNORMAL HIGH (ref 4.0–10.5)

## 2017-09-11 LAB — MAGNESIUM: MAGNESIUM: 1.9 mg/dL (ref 1.7–2.4)

## 2017-09-11 LAB — CULTURE, BLOOD (ROUTINE X 2)
CULTURE: NO GROWTH
Culture: NO GROWTH
Special Requests: ADEQUATE

## 2017-09-11 LAB — ECHOCARDIOGRAM COMPLETE
HEIGHTINCHES: 62 in
WEIGHTICAEL: 2984.15 [oz_av]

## 2017-09-11 MED ORDER — FUROSEMIDE 10 MG/ML IJ SOLN
60.0000 mg | Freq: Once | INTRAMUSCULAR | Status: AC
  Administered 2017-09-11: 60 mg via INTRAVENOUS
  Filled 2017-09-11: qty 6

## 2017-09-11 MED ORDER — POTASSIUM CHLORIDE CRYS ER 20 MEQ PO TBCR
40.0000 meq | EXTENDED_RELEASE_TABLET | ORAL | Status: AC
  Administered 2017-09-11 (×2): 40 meq via ORAL
  Filled 2017-09-11 (×2): qty 2

## 2017-09-11 NOTE — Progress Notes (Signed)
  Echocardiogram 2D Echocardiogram has been performed.  Merrie Roof F 09/11/2017, 11:19 AM

## 2017-09-11 NOTE — Care Management Note (Signed)
Case Management Note  Patient Details  Name: [name] MRN: [id number] Date of Birth: 02/13/1941  Subjective/Objective:  Pt admitted on 09/05/17 with rectal bleeding, tachycardia and Lt lower lobe PNA.   PTA, pt independent, lives at home with spouse.                 Action/Plan: PT/OT recommending SNF for rehab.  CSW following to facilitate dc to SNF upon medical stability.  Will follow progress.   Expected Discharge Date:                  Expected Discharge Plan:  Skilled Nursing Facility  In-House Referral:  Clinical Social Work  Discharge planning Services  CM Consult  Post Acute Care Choice:    Choice offered to:     DME Arranged:    DME Agency:     HH Arranged:    [place] Agency:     Status of Service:  In process, will continue to follow  If discussed at Long Length of Stay Meetings, dates discussed:    Additional Comments:  Reinaldo Raddle, RN, BSN  Trauma/Neuro ICU Case Manager [phone]

## 2017-09-11 NOTE — Progress Notes (Signed)
Pt. Has not needed bipap and sats have been 100% on HFNC. Pt. In no distress and currently resting. RT to monitor.

## 2017-09-11 NOTE — Progress Notes (Signed)
Pt c/o restless legs and asking to start ropinirole (took years ago), text page sent MD. Return call, MD uncomfortable starting medication at this time d/t pt hospital course, will think about starting in a few days.  Continue monitoring patient.

## 2017-09-11 NOTE — Progress Notes (Signed)
Patient ID: [name], female   DOB: Dec 11, 1940, 77 y.o.   MRN: [id number]  PROGRESS NOTE    [name]  GEX:[id number] DOB: January 06, 1941 DOA: 09/05/2017 PCP: [name], MD   Brief Narrative:  77 year old female with history of anxiety, depression, GERD, arthritis, tobacco abuse and diverticulosis with multiple episodes of diverticular bleeding including most recently in April 2018 presented on 09/05/2017 with bright red blood per rectum and drop in her hemoglobin from 12.9 to 9.4.  GI was consulted.  Overnight from 09/05/2017-09/06/2017, she spiked a fever to 102.6 and became tachycardic.  She was started on antibiotics for pneumonia.  Assessment & Plan:   Active Problems:   Diverticulosis of intestine with bleeding   Acute hypoxic and hypercapnic respiratory failure due to RLL pneumonia -Improving.  Wean off oxygen as able.  Currently on 3 L/min oxygen via nasal cannula.   -Off BiPAP  -Patient has a positive fluid balance of 227.4 mL since admission.  She receieve 1 dose of lasix yesterday and diuresed well. Will give 1 more dose of Lasix today and monitor urine output. -Repeat chest x-ray for today is pending -Echo still pending  Multifocal pneumonia with bilateral pleural effusion probable continue Unasyn and azithromycin -Initially was on Unasyn and Zithromax which was switched to Zosyn and Zithromax on 09/10/2011. DC zithromax today. Cultures negative so far - Continue albuterol -Urine streptococcal antigen negative.  Acute metabolic encephalopathy, likely due to hypercapnea.   -Improving.  Monitor mental status  Sepsis -Cultures negative so far.  Hemodynamically stable. -Antibiotic plan as above  Leukocytosis -Improving.  Repeat a.m. labs  Acute blood loss anemia secondary to diverticular bleeding -Hemoglobin 9.9 today.  Bleeding has probably stopped.  GI has signed off. -Transfused 1 unit PRBC on 09/08/17 -Repeat AM CBC  Hyperlipidemia, Stable, continue  simvastatin  Glaucoma with macular degeneration, stable, continued eyedrops  Hypertension -Blood Pressure stable.  Monitor   Hypokalemia -Replace and repeat AM labs  DVT prophylaxis: SCDs Code Status: Full Family Communication: None at bedside Disposition Plan: Probable Nursing home in 1-3 days  Consultants: GI  Procedures: None  Antimicrobials:  Anti-infectives (From admission, onward)   Start     Dose/Rate Route Frequency Ordered Stop   09/09/17 1000  piperacillin-tazobactam (ZOSYN) IVPB 3.375 g     3.375 g 12.5 mL/hr over 240 Minutes Intravenous Every 8 hours 09/09/17 0959     09/07/17 0800  levofloxacin (LEVAQUIN) IVPB 500 mg  Status:  Discontinued     500 mg 100 mL/hr over 60 Minutes Intravenous Every 24 hours 09/06/17 0748 09/06/17 1238   09/07/17 0800  azithromycin (ZITHROMAX) 500 mg in sodium chloride 0.9 % 250 mL IVPB     500 mg 250 mL/hr over 60 Minutes Intravenous Every 24 hours 09/06/17 1238     09/06/17 2200  vancomycin (VANCOCIN) IVPB 750 mg/150 ml premix  Status:  Discontinued     750 mg 150 mL/hr over 60 Minutes Intravenous Every 12 hours 09/06/17 0748 09/06/17 1136   09/06/17 1600  ampicillin-sulbactam (UNASYN) 1.5 g in sodium chloride 0.9 % 100 mL IVPB  Status:  Discontinued     1.5 g 200 mL/hr over 30 Minutes Intravenous Every 6 hours 09/06/17 1340 09/09/17 0942   09/06/17 1400  aztreonam (AZACTAM) 2 g in sodium chloride 0.9 % 100 mL IVPB  Status:  Discontinued     2 g 200 mL/hr over 30 Minutes Intravenous Every 8 hours 09/06/17 0748 09/06/17 1136   09/06/17 0800  levofloxacin (  LEVAQUIN) IVPB 750 mg     750 mg 100 mL/hr over 90 Minutes Intravenous  Once 09/06/17 0723 09/06/17 1000   09/06/17 0730  aztreonam (AZACTAM) 2 g in sodium chloride 0.9 % 100 mL IVPB     2 g 200 mL/hr over 30 Minutes Intravenous  Once 09/06/17 0723 09/06/17 0850   09/06/17 0730  vancomycin (VANCOCIN) 1,500 mg in sodium chloride 0.9 % 500 mL IVPB     1,500 mg 250 mL/hr over  120 Minutes Intravenous  Once 09/06/17 0723 09/06/17 1309         Subjective: Patient seen and examined at bedside.  She does not feel well but feels slightly better than yesterday.  No overnight rectal bleeding. No fever, nausea or vomiting. Feels short of breath. Objective: Vitals:   09/11/17 0739 09/11/17 0800 09/11/17 0915 09/11/17 0931  BP: 126/65 (!) 115/50    Pulse: 87 83    Resp: (!) 23 18 (!) 22 18  Temp: 98.1 F (36.7 C)     TempSrc: Oral     SpO2: 100% 99% 97% 96%  Weight:      Height:        Intake/Output Summary (Last 24 hours) at 09/11/2017 0959 Last data filed at 09/11/2017 0400 Gross per 24 hour  Intake 830 ml  Output 2600 ml  Net -1770 ml   Filed Weights   09/09/17 1030 09/10/17 1903 09/11/17 0500  Weight: 86.7 kg (191 lb 2.2 oz) 83.3 kg (183 lb 10.3 oz) 84.6 kg (186 lb 8.2 oz)    Examination:  General exam: Elderly female sitting on chair; No acute distress Respiratory system: Bilateral decreased breath sound at bases with scattered crackles; no wheezing Cardiovascular system: S1 & S2 heard, rate controlled  gastrointestinal system: Abdomen is nondistended, soft and nontender. Normal bowel sounds heard. Central nervous system: Alert and oriented. No focal neurological deficits. Moving extremities Extremities: No cyanosis, clubbing; trace edema  Skin: No rashes, lesions or ulcers Lymph: No cervical lymphadenopathy    Data Reviewed: I have personally reviewed following labs and imaging studies  CBC: Recent Labs  Lab 09/05/17 0858  09/06/17 0950  09/08/17 0407 09/09/17 0443 09/09/17 0938 09/10/17 0412 09/11/17 0425  WBC 12.9*  --  17.0*   < > 13.8* 19.1* 18.3* 13.3* 11.4*  NEUTROABS 10.6*  --  14.4*  --   --   --   --   --   --   HGB 9.4*   < > 8.5*   < > 7.2* 9.4* 9.4* 9.3* 9.9*  HCT 28.5*   < > 26.9*   < > 23.3* 30.4* 30.1* 30.4* 31.8*  MCV 91.3  --  91.2   < > 92.5 93.0 93.2 94.4 94.6  PLT 144*  --  164   < > 162 226 220 217 218   < >  = values in this interval not displayed.   Basic Metabolic Panel: Recent Labs  Lab 09/08/17 0407 09/09/17 0443 09/09/17 0938 09/10/17 0412 09/11/17 0425  NA 138 137 138 138 138  K 2.9* 4.4 4.5 3.6 3.2*  CL 107 103 102 98* 91*  CO2 25 26 26 29  38*  GLUCOSE 113* 144* 134* 98 101*  BUN 6 <5* <5* 6 5*  CREATININE 0.51 0.43* 0.44 0.49 0.46  CALCIUM 7.6* 8.1* 8.1* 8.4* 8.4*  MG 2.2  --   --  2.3 1.9   GFR: Estimated Creatinine Clearance: 58.5 mL/min (by C-G formula based on SCr of 0.46  mg/dL). Liver Function Tests: Recent Labs  Lab 09/05/17 0858 09/06/17 0950 09/09/17 0938  AST 23 21 19   ALT 11* 10* 13*  ALKPHOS 56 60 84  BILITOT 1.3* 1.1 0.3  PROT 5.5* 5.2* 5.4*  ALBUMIN 2.8* 2.3* 2.2*   No results for input(s): LIPASE, AMYLASE in the last 168 hours. Recent Labs  Lab 09/09/17 0943  AMMONIA 33   Coagulation Profile: Recent Labs  Lab 09/06/17 0950  INR 1.30   Cardiac Enzymes: Recent Labs  Lab 09/09/17 0938  TROPONINI <0.03   BNP (last 3 results) No results for input(s): PROBNP in the last 8760 hours. HbA1C: No results for input(s): HGBA1C in the last 72 hours. CBG: Recent Labs  Lab 09/05/17 0847 09/09/17 0203  GLUCAP 145* 132*   Lipid Profile: No results for input(s): CHOL, HDL, LDLCALC, TRIG, CHOLHDL, LDLDIRECT in the last 72 hours. Thyroid Function Tests: No results for input(s): TSH, T4TOTAL, FREET4, T3FREE, THYROIDAB in the last 72 hours. Anemia Panel: No results for input(s): VITAMINB12, FOLATE, FERRITIN, TIBC, IRON, RETICCTPCT in the last 72 hours. Sepsis Labs: Recent Labs  Lab 09/06/17 0950 09/06/17 1520 09/09/17 0938  PROCALCITON 2.13  --   --   LATICACIDVEN 2.1* 1.8 0.7    Recent Results (from the past 240 hour(s))  Culture, Urine     Status: Abnormal   Collection Time: 09/06/17  9:34 AM  Result Value Ref Range Status   Specimen Description URINE, CLEAN CATCH  Final   Special Requests   Final    NONE Performed at [facility], [place] [address] [place], [place] [address]    Culture MULTIPLE SPECIES PRESENT, SUGGEST RECOLLECTION (A)  Final   Report Status 09/07/2017 FINAL  Final  Culture, blood (Routine X 2) w Reflex to ID Panel     Status: None (Preliminary result)   Collection Time: 09/06/17  9:40 AM  Result Value Ref Range Status   Specimen Description BLOOD RIGHT ANTECUBITAL  Final   Special Requests   Final    IN PEDIATRIC BOTTLE Blood Culture results may not be optimal due to an excessive volume of blood received in culture bottles   Culture   Final    NO GROWTH 4 DAYS Performed at [facility], [place] [address], [place], [place] [address]    Report Status PENDING  Incomplete  Culture, blood (Routine X 2) w Reflex to ID Panel     Status: None (Preliminary result)   Collection Time: 09/06/17  9:50 AM  Result Value Ref Range Status   Specimen Description BLOOD RIGHT ANTECUBITAL  Final   Special Requests   Final    BOTTLES DRAWN AEROBIC AND ANAEROBIC Blood Culture adequate volume   Culture   Final    NO GROWTH 4 DAYS Performed at [facility], [place] [address] [place], [place] [address]    Report Status PENDING  Incomplete  MRSA PCR Screening     Status: None   Collection Time: 09/06/17 11:58 AM  Result Value Ref Range Status   MRSA by PCR NEGATIVE NEGATIVE Final    Comment:        The GeneXpert MRSA Assay (FDA approved for NASAL specimens only), is one component of a comprehensive MRSA colonization surveillance program. It is not intended to diagnose MRSA infection nor to guide or monitor treatment for MRSA infections. Performed at [facility], [place] [address], [place],  [address]  Radiology Studies: Dg Chest 2 View  Result Date: 09/11/2017 CLINICAL DATA:  Dyspnea EXAM: CHEST - 2 VIEW COMPARISON:  09/10/2017 FINDINGS: There is opacity on the left obscuring portions of the left heart border and the entire left hemidiaphragm. This is due to a  combination pleural fluid with atelectasis versus pneumonia. Minimal right pleural effusion. Lungs otherwise clear. No evidence of pulmonary edema. No pneumothorax. When compared the prior exam, lung base opacities have improved. IMPRESSION: 1. Improved lung aeration since the previous exam with decreased lung base opacity. There is still significant left lung base opacity consistent with a combination of pleural fluid with either pneumonia, atelectasis or a combination. Residual opacity on the right is due to a minimal pleural effusion. No pulmonary edema. Electronically Signed   By: Lajean Manes M.D.   On: 09/11/2017 09:26   Dg Chest Port 1 View  Result Date: 09/10/2017 CLINICAL DATA:  Follow-up pneumonia EXAM: PORTABLE CHEST 1 VIEW COMPARISON:  09/09/2017 FINDINGS: Cardiac shadow is stable. Left pleural effusion and underlying infiltrate is again noted stable from the prior exam. Some slight increase in right-sided pleural effusion is noted as well. No bony abnormality is seen. IMPRESSION: Increasing bibasilar changes left greater than right Electronically Signed   By: Inez Catalina M.D.   On: 09/10/2017 08:15        Scheduled Meds: . acidophilus  1 capsule Oral Daily  . albuterol  3 mL Inhalation BID  . chlorhexidine  15 mL Mouth Rinse BID  . cholecalciferol  2,000 Units Oral BID  . dorzolamide-timolol  1 drop Both Eyes BID  . fluticasone  2 spray Each Nare Daily  . loratadine  10 mg Oral Daily  . mouth rinse  15 mL Mouth Rinse q12n4p  . multivitamin with minerals  1 tablet Oral Daily  . pantoprazole  40 mg Oral Daily  . polyethylene glycol  17 g Oral Daily  . potassium chloride  40 mEq Oral Q4H  . propranolol  20 mg Oral Daily  . sertraline  100 mg Oral Daily  . simvastatin  20 mg Oral Daily  . sodium chloride flush  3 mL Intravenous Q12H  . vitamin C  1,000 mg Oral Daily   Continuous Infusions: . sodium chloride    . azithromycin 500 mg (09/11/17 0930)  .  piperacillin-tazobactam (ZOSYN)  IV Stopped (09/11/17 0919)     LOS: 6 days        Aline August, MD [facility] Pager [phone]  If 7PM-7AM, please contact night-coverage [email or web address] Password [email or web address] 09/11/2017, 9:59 AM

## 2017-09-11 NOTE — Evaluation (Signed)
Occupational Therapy Evaluation Patient Details Name: [name] MRN: [id number] DOB: 1941-03-14 Today's Date: 09/11/2017    History of Present Illness 77 y.o. female with significant PMH of TKA, osteopenia, HTN, frequent falls, BPPV who was admitted for rectal bleeding. She was found to be febrile with tachycardia and CXR revealed PNA in left lower lobe.   Clinical Impression   Pt admitted with above diagnosis and has deficits outlined below.  Pt would benefit from cont OT to increase independence with basic adls and adl transfers so she can return home with her husband and take care of herself. Pt is debilitated and weak but is motivated, participates well and is moving with less assist than yesterday per the chart.  Feel if pt went to rehab prior to return home it would decrease the burden on her husband and she will become more independent prior to returning home.      Follow Up Recommendations  SNF;Supervision/Assistance - 24 hour    Equipment Recommendations  3 in 1 bedside commode    Recommendations for Other Services       Precautions / Restrictions Precautions Precautions: Fall Restrictions Weight Bearing Restrictions: No      Mobility Bed Mobility Overal bed mobility: Needs Assistance Bed Mobility: Supine to Sit;Sit to Supine     Supine to sit: Min assist Sit to supine: Min assist   General bed mobility comments: Pt used bedrail and with encouragment was able to lift legs into bed with min assist.    Transfers Overall transfer level: Needs assistance Equipment used: Rolling walker (2 wheeled) Transfers: Sit to/from Stand Sit to Stand: Min assist         General transfer comment: Pt with good hand placement and safe with use of walker.    Balance Overall balance assessment: Needs assistance Sitting-balance support: No upper extremity supported Sitting balance-Leahy Scale: Good     Standing balance support: Bilateral upper extremity supported;During  functional activity Standing balance-Leahy Scale: Poor Standing balance comment: Pt required walker for safety when on her feet but no longer leaning posteriorly.                           ADL either performed or assessed with clinical judgement   ADL Overall ADL's : Needs assistance/impaired Eating/Feeding: Set up;Sitting   Grooming: Set up;Sitting   Upper Body Bathing: Set up;Sitting   Lower Body Bathing: Moderate assistance;Sit to/from stand   Upper Body Dressing : Minimal assistance;Sitting   Lower Body Dressing: Moderate assistance;Sit to/from stand   Toilet Transfer: Minimal assistance;RW;BSC;Requires wide/bariatric   Toileting- Clothing Manipulation and Hygiene: Total assistance;Sit to/from stand Toileting - Clothing Manipulation Details (indicate cue type and reason): pt stood while therapist cleaned pt.     Functional mobility during ADLs: Minimal assistance;Rolling walker General ADL Comments: Pt appears stronger today compared to chart yesterday. Pt ambulating short distance with walker and only min assist.  Pt having difficulties getting to feet to donn socks and start pants over feet;feels weak.     Vision Baseline Vision/History: Wears glasses;Glaucoma Wears Glasses: At all times Patient Visual Report: No change from baseline Vision Assessment?: No apparent visual deficits     Perception     Praxis      Pertinent Vitals/Pain Pain Assessment: Faces Faces Pain Scale: Hurts a little bit Pain Location: all over achy Pain Descriptors / Indicators: Aching Pain Intervention(s): Monitored during session;Repositioned     Hand Dominance Right  Extremity/Trunk Assessment Upper Extremity Assessment Upper Extremity Assessment: Overall WFL for tasks assessed   Lower Extremity Assessment Lower Extremity Assessment: Defer to PT evaluation   Cervical / Trunk Assessment Cervical / Trunk Assessment: Kyphotic;Other exceptions Cervical / Trunk  Exceptions: Forward head and decreased cervical lordosis   Communication Communication Communication: No difficulties   Cognition Arousal/Alertness: Awake/alert Behavior During Therapy: WFL for tasks assessed/performed Overall Cognitive Status: Within Functional Limits for tasks assessed                                 General Comments: kept eyes closed through a lot of the session.   General Comments  Pt with general weakness but mobilizing with min assist. Pt using O2 and requiring rest breaks.    Exercises     Shoulder Instructions      Home Living Family/patient expects to be discharged to:: Private residence Living Arrangements: Spouse/significant other Available Help at Discharge: Family Type of Home: House Home Access: Stairs to enter Technical brewer of Steps: 3 Entrance Stairs-Rails: Left Home Layout: One level     Bathroom Shower/Tub: Occupational psychologist: Valinda: Environmental consultant - 2 wheels;Shower seat          Prior Functioning/Environment Level of Independence: Independent        Comments: Patient states she performs household ambulation independently without a RW at baseline.         OT Problem List: Decreased activity tolerance;Impaired balance (sitting and/or standing);Decreased knowledge of use of DME or AE;Pain      OT Treatment/Interventions: Self-care/ADL training;DME and/or AE instruction;Therapeutic activities    OT Goals(Current goals can be found in the care plan section) Acute Rehab OT Goals Patient Stated Goal: Get strong again OT Goal Formulation: With patient Time For Goal Achievement: 09/25/17 Potential to Achieve Goals: Good ADL Goals Pt Will Perform Grooming: with supervision;standing Pt Will Perform Lower Body Bathing: with supervision;sit to/from stand Pt Will Perform Lower Body Dressing: with supervision;sit to/from stand;with adaptive equipment Additional ADL Goal #1: Pt will  walk to bathroom with walker and complete toileting task on comfort commode with rail and supervision.  OT Frequency: Min 2X/week   Barriers to D/C: Decreased caregiver support  lives with husband but may need more assist.       Co-evaluation              AM-PAC PT "6 Clicks" Daily Activity     Outcome Measure Help from another person eating meals?: None Help from another person taking care of personal grooming?: None Help from another person toileting, which includes using toliet, bedpan, or urinal?: A Lot Help from another person bathing (including washing, rinsing, drying)?: A Lot Help from another person to put on and taking off regular upper body clothing?: A Little Help from another person to put on and taking off regular lower body clothing?: A Lot 6 Click Score: 17   End of Session Equipment Utilized During Treatment: Oxygen Nurse Communication: Mobility status;Other (comment)(need to check bowel movement and replace catheter)  Activity Tolerance: Patient limited by fatigue Patient left: in bed;with call bell/phone within reach;with bed alarm set  OT Visit Diagnosis: Unsteadiness on feet (R26.81);Low vision, both eyes (H54.2);Pain;Other abnormalities of gait and mobility (R26.89) Pain - part of body: (all over)                Time: 8295-6213 OT Time  Calculation (min): 29 min Charges:  OT General Charges $OT Visit: 1 Visit OT Evaluation $OT Eval Moderate Complexity: 1 Mod OT Treatments $Self Care/Home Management : 8-22 mins G-Codes:     Jinger Neighbors, OTR/L [phone]  Glenford Peers 09/11/2017, 10:45 AM

## 2017-09-12 LAB — CBC
HCT: 32 % — ABNORMAL LOW (ref 36.0–46.0)
Hemoglobin: 9.9 g/dL — ABNORMAL LOW (ref 12.0–15.0)
MCH: 29.2 pg (ref 26.0–34.0)
MCHC: 30.9 g/dL (ref 30.0–36.0)
MCV: 94.4 fL (ref 78.0–100.0)
PLATELETS: 245 10*3/uL (ref 150–400)
RBC: 3.39 MIL/uL — AB (ref 3.87–5.11)
RDW: 16.5 % — ABNORMAL HIGH (ref 11.5–15.5)
WBC: 9.1 10*3/uL (ref 4.0–10.5)

## 2017-09-12 LAB — BASIC METABOLIC PANEL
Anion gap: 13 (ref 5–15)
BUN: 9 mg/dL (ref 6–20)
CALCIUM: 8.8 mg/dL — AB (ref 8.9–10.3)
CO2: 33 mmol/L — AB (ref 22–32)
CREATININE: 0.52 mg/dL (ref 0.44–1.00)
Chloride: 94 mmol/L — ABNORMAL LOW (ref 101–111)
Glucose, Bld: 117 mg/dL — ABNORMAL HIGH (ref 65–99)
Potassium: 3.8 mmol/L (ref 3.5–5.1)
SODIUM: 140 mmol/L (ref 135–145)

## 2017-09-12 LAB — MAGNESIUM: MAGNESIUM: 2.1 mg/dL (ref 1.7–2.4)

## 2017-09-12 MED ORDER — GERHARDT'S BUTT CREAM
TOPICAL_CREAM | Freq: Three times a day (TID) | CUTANEOUS | Status: DC
  Administered 2017-09-12 – 2017-09-14 (×4): via TOPICAL
  Filled 2017-09-12: qty 1

## 2017-09-12 MED ORDER — FUROSEMIDE 10 MG/ML IJ SOLN
60.0000 mg | Freq: Once | INTRAMUSCULAR | Status: AC
  Administered 2017-09-12: 60 mg via INTRAVENOUS
  Filled 2017-09-12: qty 6

## 2017-09-12 NOTE — Progress Notes (Addendum)
Patient ID: [name], female   DOB: December 14, 1940, 77 y.o.   MRN: [id number]  PROGRESS NOTE    DANIJA GOSA  WUJ:[id number] DOB: 09-Dec-1940 DOA: 09/05/2017 PCP: [name], MD   Brief Narrative:  77 year old female with history of anxiety, depression, GERD, arthritis, tobacco abuse and diverticulosis with multiple episodes of diverticular bleeding including most recently in April 2018 presented on 09/05/2017 with bright red blood per rectum and drop in her hemoglobin from 12.9 to 9.4.  GI was consulted.  Overnight from 09/05/2017-09/06/2017, she spiked a fever to 102.6 and became tachycardic.  She was started on antibiotics for pneumonia.  Assessment & Plan:   Active Problems:   Diverticulosis of intestine with bleeding   Acute hypoxic and hypercapnic respiratory failure due to RLL pneumonia -Improving.  Wean off oxygen as able.  Currently on 2L/min oxygen via nasal cannula.   -Off BiPAP  -Patient has a positive fluid balance of 427.4 mL since admission.  She has received 2 doses of Lasix so far, one each on consecutive days.  Might consider 1 more dose of Lasix today -Repeat chest x-ray from yesterday showed improvement -Echo showed EF of 55-60% with grade 1 diastolic dysfunction  Multifocal pneumonia with bilateral pleural effusion probable continue Unasyn and azithromycin -Initially was on Unasyn and Zithromax which was switched to Zosyn and Zithromax on 09/10/2011.  Of Zithromax since 09/11/2017. Cultures negative so far - Continue albuterol -Urine streptococcal antigen negative.  Acute metabolic encephalopathy, likely due to hypercapnea.   -Improving.  Monitor mental status  Sepsis -Cultures negative so far.  Hemodynamically stable. -Antibiotic plan as above  Leukocytosis -Resolved.  Repeat a.m. labs  Acute blood loss anemia secondary to diverticular bleeding -Hemoglobin 9.9 today.  Bleeding has stopped.  GI has signed off. -Transfused 1 unit PRBC on 09/08/17 -Repeat AM  CBC  Hyperlipidemia, Stable, continue simvastatin  Glaucoma with macular degeneration, stable, continued eyedrops  Hypertension -Blood Pressure stable.  Monitor   Hypokalemia -Improved  DVT prophylaxis: SCDs Code Status: Full Family Communication: Spoke to son on phone on 09/11/2017 Disposition Plan: Probable Nursing home in 1-3 days  Consultants: GI  Procedures:  Study Conclusions  - Left ventricle: The cavity size was normal. Systolic function was   normal. The estimated ejection fraction was in the range of 55%   to 60%. Wall motion was normal; there were no regional wall   motion abnormalities. Doppler parameters are consistent with   abnormal left ventricular relaxation (grade 1 diastolic   dysfunction). - Mitral valve: Calcification of the submitral structures of the   posterior leaflet. There was mild regurgitation. - Left atrium: The atrium was moderately dilated. - Pulmonary arteries: Systolic pressure was mildly increased. PA   peak pressure: 37 mm Hg (S). - Pericardium, extracardiac: A trivial pericardial effusion was   identified. There was a left pleural effusion.  Antimicrobials:  Anti-infectives (From admission, onward)   Start     Dose/Rate Route Frequency Ordered Stop   09/09/17 1000  piperacillin-tazobactam (ZOSYN) IVPB 3.375 g     3.375 g 12.5 mL/hr over 240 Minutes Intravenous Every 8 hours 09/09/17 0959     09/07/17 0800  levofloxacin (LEVAQUIN) IVPB 500 mg  Status:  Discontinued     500 mg 100 mL/hr over 60 Minutes Intravenous Every 24 hours 09/06/17 0748 09/06/17 1238   09/07/17 0800  azithromycin (ZITHROMAX) 500 mg in sodium chloride 0.9 % 250 mL IVPB  Status:  Discontinued     500 mg 250  mL/hr over 60 Minutes Intravenous Every 24 hours 09/06/17 1238 09/11/17 1620   09/06/17 2200  vancomycin (VANCOCIN) IVPB 750 mg/150 ml premix  Status:  Discontinued     750 mg 150 mL/hr over 60 Minutes Intravenous Every 12 hours 09/06/17 0748 09/06/17 1136    09/06/17 1600  ampicillin-sulbactam (UNASYN) 1.5 g in sodium chloride 0.9 % 100 mL IVPB  Status:  Discontinued     1.5 g 200 mL/hr over 30 Minutes Intravenous Every 6 hours 09/06/17 1340 09/09/17 0942   09/06/17 1400  aztreonam (AZACTAM) 2 g in sodium chloride 0.9 % 100 mL IVPB  Status:  Discontinued     2 g 200 mL/hr over 30 Minutes Intravenous Every 8 hours 09/06/17 0748 09/06/17 1136   09/06/17 0800  levofloxacin (LEVAQUIN) IVPB 750 mg     750 mg 100 mL/hr over 90 Minutes Intravenous  Once 09/06/17 0723 09/06/17 1000   09/06/17 0730  aztreonam (AZACTAM) 2 g in sodium chloride 0.9 % 100 mL IVPB     2 g 200 mL/hr over 30 Minutes Intravenous  Once 09/06/17 0723 09/06/17 0850   09/06/17 0730  vancomycin (VANCOCIN) 1,500 mg in sodium chloride 0.9 % 500 mL IVPB     1,500 mg 250 mL/hr over 120 Minutes Intravenous  Once 09/06/17 0723 09/06/17 1309          Subjective: Patient seen and examined at bedside.  She feels weak and tired.  No overnight fever, nausea or vomiting.   Objective: Vitals:   09/12/17 0400 09/12/17 0420 09/12/17 0812 09/12/17 0910  BP: 122/68   128/66  Pulse: 89   95  Resp: 18     Temp:  98.2 F (36.8 C) 97.7 F (36.5 C)   TempSrc:  Oral Oral   SpO2: 97%     Weight:  81.1 kg (178 lb 12.7 oz)    Height:        Intake/Output Summary (Last 24 hours) at 09/12/2017 0934 Last data filed at 09/12/2017 0400 Gross per 24 hour  Intake 1000 ml  Output 800 ml  Net 200 ml   Filed Weights   09/10/17 1903 09/11/17 0500 09/12/17 0420  Weight: 83.3 kg (183 lb 10.3 oz) 84.6 kg (186 lb 8.2 oz) 81.1 kg (178 lb 12.7 oz)    Examination:  General exam: No acute distress Respiratory system: Bilateral decreased breath sounds at bases cardiovascular system: Rate controlled, S1-S2 positive gastrointestinal system: Abdomen is nondistended, soft and nontender. Normal bowel sounds heard. Central nervous system: Alert and oriented. No focal neurological deficits. Moving  extremities Extremities: No cyanosis, clubbing; trace edema  Skin: No rashes, lesions or ulcers Lymph: No cervical lymphadenopathy    Data Reviewed: I have personally reviewed following labs and imaging studies  CBC: Recent Labs  Lab 09/05/17 0858  09/06/17 0950  09/09/17 0443 09/09/17 0938 09/10/17 0412 09/11/17 0425 09/12/17 0414  WBC 12.9*  --  17.0*   < > 19.1* 18.3* 13.3* 11.4* 9.1  NEUTROABS 10.6*  --  14.4*  --   --   --   --   --   --   HGB 9.4*   < > 8.5*   < > 9.4* 9.4* 9.3* 9.9* 9.9*  HCT 28.5*   < > 26.9*   < > 30.4* 30.1* 30.4* 31.8* 32.0*  MCV 91.3  --  91.2   < > 93.0 93.2 94.4 94.6 94.4  PLT 144*  --  164   < > 226 220 217  218 245   < > = values in this interval not displayed.   Basic Metabolic Panel: Recent Labs  Lab 09/08/17 0407 09/09/17 0443 09/09/17 0938 09/10/17 0412 09/11/17 0425 09/12/17 0414  NA 138 137 138 138 138 140  K 2.9* 4.4 4.5 3.6 3.2* 3.8  CL 107 103 102 98* 91* 94*  CO2 25 26 26 29  38* 33*  GLUCOSE 113* 144* 134* 98 101* 117*  BUN 6 <5* <5* 6 5* 9  CREATININE 0.51 0.43* 0.44 0.49 0.46 0.52  CALCIUM 7.6* 8.1* 8.1* 8.4* 8.4* 8.8*  MG 2.2  --   --  2.3 1.9 2.1   GFR: Estimated Creatinine Clearance: 57.2 mL/min (by C-G formula based on SCr of 0.52 mg/dL). Liver Function Tests: Recent Labs  Lab 09/05/17 0858 09/06/17 0950 09/09/17 0938  AST 23 21 19   ALT 11* 10* 13*  ALKPHOS 56 60 84  BILITOT 1.3* 1.1 0.3  PROT 5.5* 5.2* 5.4*  ALBUMIN 2.8* 2.3* 2.2*   No results for input(s): LIPASE, AMYLASE in the last 168 hours. Recent Labs  Lab 09/09/17 0943  AMMONIA 33   Coagulation Profile: Recent Labs  Lab 09/06/17 0950  INR 1.30   Cardiac Enzymes: Recent Labs  Lab 09/09/17 0938  TROPONINI <0.03   BNP (last 3 results) No results for input(s): PROBNP in the last 8760 hours. HbA1C: No results for input(s): HGBA1C in the last 72 hours. CBG: Recent Labs  Lab 09/05/17 0847 09/09/17 0203  GLUCAP 145* 132*   Lipid  Profile: No results for input(s): CHOL, HDL, LDLCALC, TRIG, CHOLHDL, LDLDIRECT in the last 72 hours. Thyroid Function Tests: No results for input(s): TSH, T4TOTAL, FREET4, T3FREE, THYROIDAB in the last 72 hours. Anemia Panel: No results for input(s): VITAMINB12, FOLATE, FERRITIN, TIBC, IRON, RETICCTPCT in the last 72 hours. Sepsis Labs: Recent Labs  Lab 09/06/17 0950 09/06/17 1520 09/09/17 0938  PROCALCITON 2.13  --   --   LATICACIDVEN 2.1* 1.8 0.7    Recent Results (from the past 240 hour(s))  Culture, Urine     Status: Abnormal   Collection Time: 09/06/17  9:34 AM  Result Value Ref Range Status   Specimen Description URINE, CLEAN CATCH  Final   Special Requests   Final    NONE Performed at [facility], [place] [address], [place], [place] [address]    Culture MULTIPLE SPECIES PRESENT, SUGGEST RECOLLECTION (A)  Final   Report Status 09/07/2017 FINAL  Final  Culture, blood (Routine X 2) w Reflex to ID Panel     Status: None   Collection Time: 09/06/17  9:40 AM  Result Value Ref Range Status   Specimen Description BLOOD RIGHT ANTECUBITAL  Final   Special Requests   Final    IN PEDIATRIC BOTTLE Blood Culture results may not be optimal due to an excessive volume of blood received in culture bottles   Culture   Final    NO GROWTH 5 DAYS Performed at [facility], [place] [address], [place], [place] [address]    Report Status 09/11/2017 FINAL  Final  Culture, blood (Routine X 2) w Reflex to ID Panel     Status: None   Collection Time: 09/06/17  9:50 AM  Result Value Ref Range Status   Specimen Description BLOOD RIGHT ANTECUBITAL  Final   Special Requests   Final    BOTTLES DRAWN AEROBIC AND ANAEROBIC Blood Culture adequate volume   Culture   Final    NO GROWTH  5 DAYS Performed at [facility], [place] [address], [place], [place] [address]    Report Status 09/11/2017 FINAL  Final  MRSA PCR Screening     Status: None   Collection Time: 09/06/17 11:58 AM  Result  Value Ref Range Status   MRSA by PCR NEGATIVE NEGATIVE Final    Comment:        The GeneXpert MRSA Assay (FDA approved for NASAL specimens only), is one component of a comprehensive MRSA colonization surveillance program. It is not intended to diagnose MRSA infection nor to guide or monitor treatment for MRSA infections. Performed at [facility], [place] [address] [place], [place] [address]          Radiology Studies: Dg Chest 2 View  Result Date: 09/11/2017 CLINICAL DATA:  Dyspnea EXAM: CHEST - 2 VIEW COMPARISON:  09/10/2017 FINDINGS: There is opacity on the left obscuring portions of the left heart border and the entire left hemidiaphragm. This is due to a combination pleural fluid with atelectasis versus pneumonia. Minimal right pleural effusion. Lungs otherwise clear. No evidence of pulmonary edema. No pneumothorax. When compared the prior exam, lung base opacities have improved. IMPRESSION: 1. Improved lung aeration since the previous exam with decreased lung base opacity. There is still significant left lung base opacity consistent with a combination of pleural fluid with either pneumonia, atelectasis or a combination. Residual opacity on the right is due to a minimal pleural effusion. No pulmonary edema. Electronically Signed   By: Lajean Manes M.D.   On: 09/11/2017 09:26        Scheduled Meds: . acidophilus  1 capsule Oral Daily  . albuterol  3 mL Inhalation BID  . chlorhexidine  15 mL Mouth Rinse BID  . cholecalciferol  2,000 Units Oral BID  . dorzolamide-timolol  1 drop Both Eyes BID  . fluticasone  2 spray Each Nare Daily  . loratadine  10 mg Oral Daily  . mouth rinse  15 mL Mouth Rinse q12n4p  . multivitamin with minerals  1 tablet Oral Daily  . pantoprazole  40 mg Oral Daily  . polyethylene glycol  17 g Oral Daily  . propranolol  20 mg Oral Daily  . sertraline  100 mg Oral Daily  . simvastatin  20 mg Oral Daily  . sodium chloride flush  3 mL Intravenous  Q12H  . vitamin C  1,000 mg Oral Daily   Continuous Infusions: . sodium chloride    . piperacillin-tazobactam (ZOSYN)  IV 3.375 g (09/12/17 0544)     LOS: 7 days        Aline August, MD [facility] Pager [phone]  If 7PM-7AM, please contact night-coverage [email or web address] Password [facility] 09/12/2017, 9:34 AM

## 2017-09-12 NOTE — Social Work (Signed)
CSW has arranged SNF bed at [facility], pt insurance authorization has also been approved for pt to discharge to SNF. CSW received a call that pt husband is trying to decide between SNF and HH.   RN Case Manager to reach out to pt husband.  If pt discharges over the weekend to SNF all is in order.   Alexander Mt, [place] Work [phone]

## 2017-09-12 NOTE — Social Work (Addendum)
CSW met with pt and pt husband at bedside, pt and pt husband have chosen [facility]. [facility] is aware and has received insurance authorization through [facility] whenever pt is medically appropriate for discharge.   Pt husband and pt both grasp the need for therapies and support as pt gets stronger.   Alexander Mt, [place] Work [phone]

## 2017-09-13 LAB — CBC
HEMATOCRIT: 34 % — AB (ref 36.0–46.0)
HEMOGLOBIN: 10.5 g/dL — AB (ref 12.0–15.0)
MCH: 28.8 pg (ref 26.0–34.0)
MCHC: 30.9 g/dL (ref 30.0–36.0)
MCV: 93.2 fL (ref 78.0–100.0)
Platelets: 257 10*3/uL (ref 150–400)
RBC: 3.65 MIL/uL — ABNORMAL LOW (ref 3.87–5.11)
RDW: 16.7 % — AB (ref 11.5–15.5)
WBC: 10.5 10*3/uL (ref 4.0–10.5)

## 2017-09-13 LAB — BASIC METABOLIC PANEL
ANION GAP: 12 (ref 5–15)
BUN: 6 mg/dL (ref 6–20)
CO2: 31 mmol/L (ref 22–32)
Calcium: 8.9 mg/dL (ref 8.9–10.3)
Chloride: 95 mmol/L — ABNORMAL LOW (ref 101–111)
Creatinine, Ser: 0.54 mg/dL (ref 0.44–1.00)
GFR calc Af Amer: >60 mL/min (ref >60)
GLUCOSE: 116 mg/dL — AB (ref 65–99)
POTASSIUM: 3.7 mmol/L (ref 3.5–5.1)
Sodium: 138 mmol/L (ref 135–145)

## 2017-09-13 LAB — MAGNESIUM: Magnesium: 2.2 mg/dL (ref 1.7–2.4)

## 2017-09-13 MED ORDER — FUROSEMIDE 10 MG/ML IJ SOLN
60.0000 mg | Freq: Once | INTRAMUSCULAR | Status: AC
  Administered 2017-09-13: 60 mg via INTRAVENOUS
  Filled 2017-09-13: qty 6

## 2017-09-13 NOTE — Progress Notes (Signed)
Patient ID: [name], female   DOB: [birth year], 77 y.o.   MRN: [id number]  PROGRESS NOTE    KEMARIA DEDIC  JSE:[id number] DOB: [birth year] DOA: 09/05/2017 PCP: [name], MD   Brief Narrative:  77 year old female with history of anxiety, depression, GERD, arthritis, tobacco abuse and diverticulosis with multiple episodes of diverticular bleeding including most recently in April 2018 presented on 09/05/2017 with bright red blood per rectum and drop in her hemoglobin from 12.9 to 9.4.  GI was consulted.  Overnight from 09/05/2017-09/06/2017, she spiked a fever to 102.6 and became tachycardic.  She was started on antibiotics for pneumonia.  Assessment & Plan:   Active Problems:   Diverticulosis of intestine with bleeding   Acute hypoxic and hypercapnic respiratory failure due to RLL pneumonia -Improving.  Wean off oxygen as able.  Currently on 2L/min oxygen via nasal cannula.   -Off BiPAP  -She has received 3 doses of Lasix so far, one each on the last three consecutive days.  Will give one more dose of iv Lasix today -Repeat chest x-ray tomorrow -Echo showed EF of 55-60% with grade 1 diastolic dysfunction  Multifocal pneumonia with bilateral pleural effusion probable continue Unasyn and azithromycin -Initially was on Unasyn and Zithromax which was switched to Zosyn and Zithromax on 09/10/2011.  Of Zithromax since 09/11/2017. Cultures negative so far.  Today is day 8 of antibiotics altogether.  Discontinue antibiotics and monitor - Continue albuterol -Urine streptococcal antigen negative.  Acute metabolic encephalopathy, likely due to hypercapnea.   -Improving.  Monitor mental status  Sepsis -Resolved.  Cultures negative so far.  Hemodynamically stable. -Antibiotic plan as above  Leukocytosis -Resolved.  Repeat a.m. labs  Acute blood loss anemia secondary to diverticular bleeding -Hemoglobin 10.5 today.  Bleeding has stopped.  GI has signed off. -Transfused 1 unit PRBC on  09/08/17 -Repeat AM CBC  Generalized deconditioning -Patient still feels very weak and does not feel ready to be discharged to nursing facility today. -Continue physical therapy   Hyperlipidemia, Stable, continue simvastatin  Glaucoma with macular degeneration, stable, continued eyedrops  Hypertension -Blood Pressure stable.  Monitor   Hypokalemia -Improved  DVT prophylaxis: SCDs Code Status: Full Family Communication: None at bedside Disposition Plan: Probable Nursing home in 1-2 days  Consultants: GI  Procedures:  Study Conclusions  - Left ventricle: The cavity size was normal. Systolic function was   normal. The estimated ejection fraction was in the range of 55%   to 60%. Wall motion was normal; there were no regional wall   motion abnormalities. Doppler parameters are consistent with   abnormal left ventricular relaxation (grade 1 diastolic   dysfunction). - Mitral valve: Calcification of the submitral structures of the   posterior leaflet. There was mild regurgitation. - Left atrium: The atrium was moderately dilated. - Pulmonary arteries: Systolic pressure was mildly increased. PA   peak pressure: 37 mm Hg (S). - Pericardium, extracardiac: A trivial pericardial effusion was   identified. There was a left pleural effusion.  Antimicrobials:  Anti-infectives (From admission, onward)   Start     Dose/Rate Route Frequency Ordered Stop   09/09/17 1000  piperacillin-tazobactam (ZOSYN) IVPB 3.375 g     3.375 g 12.5 mL/hr over 240 Minutes Intravenous Every 8 hours 09/09/17 0959     09/07/17 0800  levofloxacin (LEVAQUIN) IVPB 500 mg  Status:  Discontinued     500 mg 100 mL/hr over 60 Minutes Intravenous Every 24 hours 09/06/17 0748 09/06/17 1238   09/07/17 0800  azithromycin (ZITHROMAX) 500 mg in sodium chloride 0.9 % 250 mL IVPB  Status:  Discontinued     500 mg 250 mL/hr over 60 Minutes Intravenous Every 24 hours 09/06/17 1238 09/11/17 1620   09/06/17 2200   vancomycin (VANCOCIN) IVPB 750 mg/150 ml premix  Status:  Discontinued     750 mg 150 mL/hr over 60 Minutes Intravenous Every 12 hours 09/06/17 0748 09/06/17 1136   09/06/17 1600  ampicillin-sulbactam (UNASYN) 1.5 g in sodium chloride 0.9 % 100 mL IVPB  Status:  Discontinued     1.5 g 200 mL/hr over 30 Minutes Intravenous Every 6 hours 09/06/17 1340 09/09/17 0942   09/06/17 1400  aztreonam (AZACTAM) 2 g in sodium chloride 0.9 % 100 mL IVPB  Status:  Discontinued     2 g 200 mL/hr over 30 Minutes Intravenous Every 8 hours 09/06/17 0748 09/06/17 1136   09/06/17 0800  levofloxacin (LEVAQUIN) IVPB 750 mg     750 mg 100 mL/hr over 90 Minutes Intravenous  Once 09/06/17 0723 09/06/17 1000   09/06/17 0730  aztreonam (AZACTAM) 2 g in sodium chloride 0.9 % 100 mL IVPB     2 g 200 mL/hr over 30 Minutes Intravenous  Once 09/06/17 0723 09/06/17 0850   09/06/17 0730  vancomycin (VANCOCIN) 1,500 mg in sodium chloride 0.9 % 500 mL IVPB     1,500 mg 250 mL/hr over 120 Minutes Intravenous  Once 09/06/17 0723 09/06/17 1309      Subjective: Patient seen and examined at bedside.  She feels very weak but better than yesterday.  She does not feel ready to be discharged today.  No overnight fever or vomiting.  Still intermittently short of breath and coughing   objective: Vitals:   09/13/17 0331 09/13/17 0400 09/13/17 0800 09/13/17 1005  BP:  (!) 115/55    Pulse:  84    Resp:  19    Temp: 98.2 F (36.8 C)  97.6 F (36.4 C)   TempSrc: Oral  Oral   SpO2:  97%  97%  Weight: 79.3 kg (174 lb 13.2 oz)     Height:        Intake/Output Summary (Last 24 hours) at 09/13/2017 1133 Last data filed at 09/13/2017 0400 Gross per 24 hour  Intake 460 ml  Output 1450 ml  Net -990 ml   Filed Weights   09/11/17 0500 09/12/17 0420 09/13/17 0331  Weight: 84.6 kg (186 lb 8.2 oz) 81.1 kg (178 lb 12.7 oz) 79.3 kg (174 lb 13.2 oz)    Examination:  General exam: No acute distress.  Elderly female lying in bed, ill  looking Respiratory system: Bilateral decreased breath sounds at bases with some scattered crackles  cardiovascular system: S2 positive, rate controlled  gastrointestinal system: Abdomen is nondistended, soft and nontender. Normal bowel sounds heard. Central nervous system: Alert and oriented. No focal neurological deficits. Moving extremities Extremities: No cyanosis; trace edema Skin: No rashes, lesions or ulcers Psych: Flat affect    Data Reviewed: I have personally reviewed following labs and imaging studies  CBC: Recent Labs  Lab 09/09/17 0938 09/10/17 0412 09/11/17 0425 09/12/17 0414 09/13/17 0403  WBC 18.3* 13.3* 11.4* 9.1 10.5  HGB 9.4* 9.3* 9.9* 9.9* 10.5*  HCT 30.1* 30.4* 31.8* 32.0* 34.0*  MCV 93.2 94.4 94.6 94.4 93.2  PLT 220 217 218 245 546   Basic Metabolic Panel: Recent Labs  Lab 09/08/17 0407  09/09/17 0938 09/10/17 0412 09/11/17 0425 09/12/17 0414 09/13/17 0403  NA 138   < >  138 138 138 140 138  K 2.9*   < > 4.5 3.6 3.2* 3.8 3.7  CL 107   < > 102 98* 91* 94* 95*  CO2 25   < > 26 29 38* 33* 31  GLUCOSE 113*   < > 134* 98 101* 117* 116*  BUN 6   < > <5* 6 5* 9 6  CREATININE 0.51   < > 0.44 0.49 0.46 0.52 0.54  CALCIUM 7.6*   < > 8.1* 8.4* 8.4* 8.8* 8.9  MG 2.2  --   --  2.3 1.9 2.1 2.2   < > = values in this interval not displayed.   GFR: Estimated Creatinine Clearance: 56.5 mL/min (by C-G formula based on SCr of 0.54 mg/dL). Liver Function Tests: Recent Labs  Lab 09/09/17 0938  AST 19  ALT 13*  ALKPHOS 84  BILITOT 0.3  PROT 5.4*  ALBUMIN 2.2*   No results for input(s): LIPASE, AMYLASE in the last 168 hours. Recent Labs  Lab 09/09/17 0943  AMMONIA 33   Coagulation Profile: No results for input(s): INR, PROTIME in the last 168 hours. Cardiac Enzymes: Recent Labs  Lab 09/09/17 0938  TROPONINI <0.03   BNP (last 3 results) No results for input(s): PROBNP in the last 8760 hours. HbA1C: No results for input(s): HGBA1C in the last  72 hours. CBG: Recent Labs  Lab 09/09/17 0203  GLUCAP 132*   Lipid Profile: No results for input(s): CHOL, HDL, LDLCALC, TRIG, CHOLHDL, LDLDIRECT in the last 72 hours. Thyroid Function Tests: No results for input(s): TSH, T4TOTAL, FREET4, T3FREE, THYROIDAB in the last 72 hours. Anemia Panel: No results for input(s): VITAMINB12, FOLATE, FERRITIN, TIBC, IRON, RETICCTPCT in the last 72 hours. Sepsis Labs: Recent Labs  Lab 09/06/17 1520 09/09/17 0938  LATICACIDVEN 1.8 0.7    Recent Results (from the past 240 hour(s))  Culture, Urine     Status: Abnormal   Collection Time: 09/06/17  9:34 AM  Result Value Ref Range Status   Specimen Description URINE, CLEAN CATCH  Final   Special Requests   Final    NONE Performed at [facility], [place] [address], [place], [place] [address]    Culture MULTIPLE SPECIES PRESENT, SUGGEST RECOLLECTION (A)  Final   Report Status 09/07/2017 FINAL  Final  Culture, blood (Routine X 2) w Reflex to ID Panel     Status: None   Collection Time: 09/06/17  9:40 AM  Result Value Ref Range Status   Specimen Description BLOOD RIGHT ANTECUBITAL  Final   Special Requests   Final    IN PEDIATRIC BOTTLE Blood Culture results may not be optimal due to an excessive volume of blood received in culture bottles   Culture   Final    NO GROWTH 5 DAYS Performed at [facility], [place] [address] [place], [place] [address]    Report Status 09/11/2017 FINAL  Final  Culture, blood (Routine X 2) w Reflex to ID Panel     Status: None   Collection Time: 09/06/17  9:50 AM  Result Value Ref Range Status   Specimen Description BLOOD RIGHT ANTECUBITAL  Final   Special Requests   Final    BOTTLES DRAWN AEROBIC AND ANAEROBIC Blood Culture adequate volume   Culture   Final    NO GROWTH 5 DAYS Performed at [facility], [place] [address] [place],  [address]    Report Status 09/11/2017 FINAL  Final  MRSA PCR Screening  Status: None   Collection Time:  09/06/17 11:58 AM  Result Value Ref Range Status   MRSA by PCR NEGATIVE NEGATIVE Final    Comment:        The GeneXpert MRSA Assay (FDA approved for NASAL specimens only), is one component of a comprehensive MRSA colonization surveillance program. It is not intended to diagnose MRSA infection nor to guide or monitor treatment for MRSA infections. Performed at [facility], [place] [address], [place], [place] [address]          Radiology Studies: No results found.      Scheduled Meds: . acidophilus  1 capsule Oral Daily  . albuterol  3 mL Inhalation BID  . chlorhexidine  15 mL Mouth Rinse BID  . cholecalciferol  2,000 Units Oral BID  . dorzolamide-timolol  1 drop Both Eyes BID  . fluticasone  2 spray Each Nare Daily  . Gerhardt's butt cream   Topical TID  . loratadine  10 mg Oral Daily  . mouth rinse  15 mL Mouth Rinse q12n4p  . multivitamin with minerals  1 tablet Oral Daily  . pantoprazole  40 mg Oral Daily  . polyethylene glycol  17 g Oral Daily  . propranolol  20 mg Oral Daily  . sertraline  100 mg Oral Daily  . simvastatin  20 mg Oral Daily  . sodium chloride flush  3 mL Intravenous Q12H  . vitamin C  1,000 mg Oral Daily   Continuous Infusions: . sodium chloride    . piperacillin-tazobactam (ZOSYN)  IV 3.375 g (09/13/17 0856)     LOS: 8 days        Aline August, MD [facility] Pager [phone]  If 7PM-7AM, please contact night-coverage [email or web address] Password [facility] 09/13/2017, 11:33 AM

## 2017-09-13 NOTE — Progress Notes (Signed)
CSW spoke with pt's husband and was informed that they think it is best that pt go to [facility] at the time of discharge. CSW spoke with MD and was informed that he WILL NOT discharge pt today as he plans to stop antibiotics today. Plan at this time is for pt to go to [facility] place tomorrow if medically cleared and discharged. CSW will follow up tomorrow on case.      Virgie Dad Kimala Horne, MSW, [facility] Clinical Social Worker [phone]

## 2017-09-13 NOTE — Progress Notes (Signed)
Patient resting comfortably on 2L [place]. BIPAP is not needed at this time. BIPAP is at bedside. RT will monitor as needed.

## 2017-09-14 ENCOUNTER — Inpatient Hospital Stay (HOSPITAL_COMMUNITY): Payer: Medicare HMO

## 2017-09-14 DIAGNOSIS — D649 Anemia, unspecified: Secondary | ICD-10-CM | POA: Diagnosis not present

## 2017-09-14 DIAGNOSIS — Z8719 Personal history of other diseases of the digestive system: Secondary | ICD-10-CM | POA: Diagnosis not present

## 2017-09-14 DIAGNOSIS — K5790 Diverticulosis of intestine, part unspecified, without perforation or abscess without bleeding: Secondary | ICD-10-CM | POA: Diagnosis not present

## 2017-09-14 DIAGNOSIS — K219 Gastro-esophageal reflux disease without esophagitis: Secondary | ICD-10-CM | POA: Diagnosis not present

## 2017-09-14 DIAGNOSIS — H409 Unspecified glaucoma: Secondary | ICD-10-CM | POA: Diagnosis not present

## 2017-09-14 DIAGNOSIS — R0602 Shortness of breath: Secondary | ICD-10-CM | POA: Diagnosis not present

## 2017-09-14 DIAGNOSIS — K5731 Diverticulosis of large intestine without perforation or abscess with bleeding: Secondary | ICD-10-CM | POA: Diagnosis not present

## 2017-09-14 DIAGNOSIS — K5900 Constipation, unspecified: Secondary | ICD-10-CM | POA: Diagnosis not present

## 2017-09-14 DIAGNOSIS — M6281 Muscle weakness (generalized): Secondary | ICD-10-CM | POA: Diagnosis not present

## 2017-09-14 DIAGNOSIS — R4182 Altered mental status, unspecified: Secondary | ICD-10-CM | POA: Diagnosis not present

## 2017-09-14 DIAGNOSIS — L290 Pruritus ani: Secondary | ICD-10-CM | POA: Diagnosis not present

## 2017-09-14 DIAGNOSIS — R221 Localized swelling, mass and lump, neck: Secondary | ICD-10-CM | POA: Diagnosis not present

## 2017-09-14 DIAGNOSIS — R41841 Cognitive communication deficit: Secondary | ICD-10-CM | POA: Diagnosis not present

## 2017-09-14 DIAGNOSIS — K047 Periapical abscess without sinus: Secondary | ICD-10-CM | POA: Diagnosis not present

## 2017-09-14 DIAGNOSIS — K5791 Diverticulosis of intestine, part unspecified, without perforation or abscess with bleeding: Secondary | ICD-10-CM | POA: Diagnosis not present

## 2017-09-14 DIAGNOSIS — J189 Pneumonia, unspecified organism: Secondary | ICD-10-CM | POA: Diagnosis not present

## 2017-09-14 DIAGNOSIS — R2689 Other abnormalities of gait and mobility: Secondary | ICD-10-CM | POA: Diagnosis not present

## 2017-09-14 DIAGNOSIS — R69 Illness, unspecified: Secondary | ICD-10-CM | POA: Diagnosis not present

## 2017-09-14 DIAGNOSIS — R498 Other voice and resonance disorders: Secondary | ICD-10-CM | POA: Diagnosis not present

## 2017-09-14 DIAGNOSIS — Z6833 Body mass index (BMI) 33.0-33.9, adult: Secondary | ICD-10-CM | POA: Diagnosis not present

## 2017-09-14 DIAGNOSIS — K922 Gastrointestinal hemorrhage, unspecified: Secondary | ICD-10-CM | POA: Diagnosis not present

## 2017-09-14 LAB — BASIC METABOLIC PANEL
Anion gap: 12 (ref 5–15)
BUN: 13 mg/dL (ref 6–20)
CHLORIDE: 95 mmol/L — AB (ref 101–111)
CO2: 33 mmol/L — ABNORMAL HIGH (ref 22–32)
Calcium: 8.9 mg/dL (ref 8.9–10.3)
Creatinine, Ser: 0.56 mg/dL (ref 0.44–1.00)
GFR calc Af Amer: >60 mL/min (ref >60)
GFR calc non Af Amer: >60 mL/min (ref >60)
GLUCOSE: 120 mg/dL — AB (ref 65–99)
POTASSIUM: 3.4 mmol/L — AB (ref 3.5–5.1)
Sodium: 140 mmol/L (ref 135–145)

## 2017-09-14 LAB — CBC
HEMATOCRIT: 34.7 % — AB (ref 36.0–46.0)
Hemoglobin: 10.8 g/dL — ABNORMAL LOW (ref 12.0–15.0)
MCH: 28.7 pg (ref 26.0–34.0)
MCHC: 31.1 g/dL (ref 30.0–36.0)
MCV: 92.3 fL (ref 78.0–100.0)
Platelets: 261 10*3/uL (ref 150–400)
RBC: 3.76 MIL/uL — ABNORMAL LOW (ref 3.87–5.11)
RDW: 17.3 % — AB (ref 11.5–15.5)
WBC: 11.4 10*3/uL — ABNORMAL HIGH (ref 4.0–10.5)

## 2017-09-14 LAB — MAGNESIUM: Magnesium: 2.3 mg/dL (ref 1.7–2.4)

## 2017-09-14 MED ORDER — POTASSIUM CHLORIDE CRYS ER 20 MEQ PO TBCR
40.0000 meq | EXTENDED_RELEASE_TABLET | Freq: Once | ORAL | Status: AC
  Administered 2017-09-14: 40 meq via ORAL
  Filled 2017-09-14: qty 2

## 2017-09-14 MED ORDER — ALPRAZOLAM 0.25 MG PO TABS: 0.2500 mg | ORAL_TABLET | Freq: Every day | ORAL | 0 refills | Status: DC | PRN

## 2017-09-14 NOTE — Discharge Summary (Signed)
Physician Discharge Summary  [name] WCB:[id number] DOB: 18-Dec-1940 DOA: 09/05/2017  PCP: [name], MD  Admit date: 09/05/2017 Discharge date: 09/14/2017  Admitted From: Home Disposition:  SNF  Recommendations for Outpatient Follow-up:  1. Follow up with nursing home provider at earliest convenience with repeat CBC/BMP 2. Follow-up with GI/Dr. Oletta Lamas in 1-2 weeks 3. Patient will benefit from outpatient evaluation and follow-up by pulmonary for hypoxia and recent pneumonia   Home Health: No Equipment/Devices: None  Discharge Condition: Stable CODE STATUS: Full Diet recommendation: Heart Healthy  Brief/Interim Summary: 77 year old female with history of anxiety, depression, GERD, arthritis, tobacco abuse and diverticulosis with multiple episodes of diverticular bleeding including most recently in April 2018 presented on 09/05/2017 with bright red blood per rectum and drop in her hemoglobin from 12.9 to 9.4.  GI was consulted.  Overnight from 09/05/2017-09/06/2017, she spiked a fever to 102.6 and became tachycardic.  She was started on antibiotics for pneumonia.  Her bleeding is stopped and hemoglobin has remained stable.  GI has signed off.  She was treated with a course of intravenous antibiotics, cultures have remained negative.  She was initially requiring oxygen but currently is on room air.  Antibiotics were discontinued on 09/13/2017.  She is weak and deconditioned.  She will be discharged to nursing home once bed is available.   Discharge Diagnoses:  Active Problems:   Diverticulosis of intestine with bleeding  Acute hypoxic and hypercapnic respiratory failure due to RLL pneumonia -Resolved.  Currently on room air.  Patient initially required BiPAP which was weaned to nasal cannula oxygen.  I -She has received 4 doses of Lasix so far, one each on the last 4 consecutive days.    No need for further Lasix.  Might try low-dose Lasix in the nursing home if patient becomes  tachypneic -Continue incentive spirometry.  Patient might benefit from outpatient repeat chest x-ray in a week.  Also might benefit from outpatient pulmonary evaluation. -Echo showed EF of 55-60% with grade 1 diastolic dysfunction  Multifocal pneumonia with bilateral pleural effusion probable continue Unasyn and azithromycin -Initially was on Unasyn and Zithromax which was switched to Zosyn and Zithromax on 09/10/2011.  Of Zithromax since 09/11/2017. Cultures negative so far.    Antibiotics were discontinued yesterday on 09/13/2017 on day #8. - Continue albuterol -Urine streptococcal antigen negative.  Acute metabolic encephalopathy, likely due to hypercapnea.  -Improved.    Sepsis -Resolved.  Cultures negative so far.  Hemodynamically stable. -Antibiotic plan as above  Leukocytosis -Improved  Acute blood loss anemia secondary to diverticular bleeding -Hemoglobin 10.8 today.  Bleeding has stopped.  GI has signed off. -Transfused1 unit PRBC on 09/08/17 -Outpatient follow-up with GI  Generalized deconditioning -Continue PT in the nursing home  Hyperlipidemia, Stable, continue simvastatin  Glaucoma with macular degeneration, stable, continued eyedrops  Hypertension -Blood Pressure stable.  Monitor   Hypokalemia -Outpatient follow-up   Discharge Instructions  Discharge Instructions    Ambulatory referral to Pulmonology   Complete by:  As directed    Hypoxia/pneumonia   Call MD for:  difficulty breathing, headache or visual disturbances   Complete by:  As directed    Call MD for:  extreme fatigue   Complete by:  As directed    Call MD for:  hives   Complete by:  As directed    Call MD for:  persistant dizziness or light-headedness   Complete by:  As directed    Call MD for:  persistant nausea and vomiting  Complete by:  As directed    Call MD for:  severe uncontrolled pain   Complete by:  As directed    Call MD for:  temperature >100.4   Complete by:  As  directed    Diet - low sodium heart healthy   Complete by:  As directed    Increase activity slowly   Complete by:  As directed      Allergies as of 09/14/2017      Reactions   Morphine And Related Itching   Cefadroxil Hives   Patient can take amoxicillin and cipro      Medication List    STOP taking these medications   HYDROcodone-acetaminophen 5-325 MG tablet Commonly known as:  NORCO/VICODIN   ICAPS AREDS 2 PO     TAKE these medications   acetaminophen 500 MG tablet Commonly known as:  TYLENOL Take 1,000 mg by mouth daily as needed for headache.   albuterol 108 (90 Base) MCG/ACT inhaler Commonly known as:  PROVENTIL HFA;VENTOLIN HFA Inhale 2 puffs into the lungs daily as needed. For seasonal allergies . Use 2 puffs 3 times daily x 4 days then back to home regimen.   ALPRAZolam 0.25 MG tablet Commonly known as:  XANAX Take 1 tablet (0.25 mg total) by mouth daily as needed for anxiety.   cholecalciferol 1000 units tablet Commonly known as:  VITAMIN D Take 2,000 Units by mouth 2 (two) times daily.   dorzolamide-timolol 22.3-6.8 MG/ML ophthalmic solution Commonly known as:  COSOPT Place 1 drop into both eyes 2 (two) times daily.   fluticasone 50 MCG/ACT nasal spray Commonly known as:  FLONASE Place 2 sprays into both nostrils daily.   loperamide 2 MG capsule Commonly known as:  IMODIUM Take 4 mg by mouth as needed for diarrhea or loose stools.   loratadine 10 MG tablet Commonly known as:  CLARITIN Take 1 tablet (10 mg total) by mouth daily.   multivitamin with minerals Tabs tablet Take 1 tablet by mouth daily.   omeprazole 20 MG capsule Commonly known as:  PRILOSEC Take 20 mg by mouth daily.   polyethylene glycol packet Commonly known as:  MIRALAX / GLYCOLAX Take 17 g by mouth daily.   PROBIOTIC-10 Caps Take 1 capsule by mouth daily.   propranolol 20 MG tablet Commonly known as:  INDERAL Take 20 mg by mouth daily.   sertraline 100 MG  tablet Commonly known as:  ZOLOFT Take 100 mg by mouth daily.   simvastatin 20 MG tablet Commonly known as:  ZOCOR Take 20 mg by mouth daily.   vitamin C 500 MG tablet Commonly known as:  ASCORBIC ACID Take 1,000 mg by mouth daily.       Contact information for follow-up providers    [name], MD. Schedule an appointment as soon as possible for a visit in 1 week(s).   Specialty:  Internal Medicine Contact information: [place] [address] [phone]        Laurence Spates, MD. Schedule an appointment as soon as possible for a visit in 1 week(s).   Specialty:  Gastroenterology Contact information: [address] [place] [address] [phone]            Contact information for after-discharge care    Destination    [facility] Follow up.   Service:  Skilled Nursing Contact information: [place] [phone]  Allergies  Allergen Reactions  . Morphine And Related Itching  . Cefadroxil Hives    Patient can take amoxicillin and cipro    Consultations:  GI   Procedures/Studies: Dg Chest 2 View  Result Date: 09/14/2017 CLINICAL DATA:  Shortness of breath. EXAM: CHEST - 2 VIEW COMPARISON:  Chest radiograph 09/11/2017. FINDINGS: Monitoring leads overlie the patient. Stable cardiac and mediastinal contours. Moderate left pleural effusion with underlying opacities. No pneumothorax. Thoracic spine degenerative changes. IMPRESSION: Persistent moderate left pleural effusion with underlying opacities. Electronically Signed   By: Lovey Newcomer M.D.   On: 09/14/2017 11:32   Dg Chest 2 View  Result Date: 09/11/2017 CLINICAL DATA:  Dyspnea EXAM: CHEST - 2 VIEW COMPARISON:  09/10/2017 FINDINGS: There is opacity on the left obscuring portions of the left heart border and the entire left hemidiaphragm. This is due to a combination pleural fluid with atelectasis versus  pneumonia. Minimal right pleural effusion. Lungs otherwise clear. No evidence of pulmonary edema. No pneumothorax. When compared the prior exam, lung base opacities have improved. IMPRESSION: 1. Improved lung aeration since the previous exam with decreased lung base opacity. There is still significant left lung base opacity consistent with a combination of pleural fluid with either pneumonia, atelectasis or a combination. Residual opacity on the right is due to a minimal pleural effusion. No pulmonary edema. Electronically Signed   By: Lajean Manes M.D.   On: 09/11/2017 09:26   Ct Head Wo Contrast  Result Date: 09/02/2017 CLINICAL DATA:  Patient fell onto concrete at 1900 hours this evening. Patient struck the front of her head on the concrete floor without loss of consciousness. EXAM: CT HEAD WITHOUT CONTRAST CT CERVICAL SPINE WITHOUT CONTRAST TECHNIQUE: Multidetector CT imaging of the head and cervical spine was performed following the standard protocol without intravenous contrast. Multiplanar CT image reconstructions of the cervical spine were also generated. COMPARISON:  08/07/2017 FINDINGS: CT HEAD FINDINGS Brain: Mild superficial atrophy. No acute intracranial hemorrhage, midline shift or edema. No large vascular territory infarct. Minimal chronic small vessel ischemic disease of periventricular white matter. No intra-axial mass nor extra-axial fluid collections. Midline fourth ventricle and basal cisterns. Vascular: No hyperdense vessel sign. Atherosclerotic calcifications of the carotid siphons. Skull: No acute skull fracture.  No suspicious osseous lesions. Sinuses/Orbits: No acute finding. Other: Left frontal scalp hematoma. CT CERVICAL SPINE FINDINGS Alignment: Reversal of cervical lordosis. Osteoarthritis of the atlantodental interval. Intact craniocervical relationship. Skull base and vertebrae: No acute cervical spine fracture. No jumped or perched facets. Intact skull base. Soft tissues and  spinal canal: No prevertebral soft tissue swelling. No visible canal hematoma. Disc levels: Mild disc space narrowing C2 through C4 and C5-6 with marked disc space narrowing at C4-5, C6-7 and C7-T1. Degenerative facet arthropathy is noted bilaterally at C2-3 and C3-4 on the left at C5-6 on the right. Associated uncovertebral joint osteoarthritis with uncinate spurring is seen bilaterally at C3-4, C4-5 and C6-7. Upper chest: Negative. Other: None IMPRESSION: 1. Chronic mild superficial atrophy and small vessel ischemic disease. No acute intracranial abnormality. 2. Mild left frontal scalp contusion.  No underlying skull fracture. 3. 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 Electronically Signed   By: Ashley Royalty M.D.   On: 09/02/2017 23:54   Ct Cervical Spine Wo Contrast  Result Date: 09/02/2017 CLINICAL DATA:  Patient fell onto concrete at 1900 hours this evening. Patient struck the front of her head on the concrete floor without loss of consciousness. EXAM: CT HEAD WITHOUT CONTRAST CT  CERVICAL SPINE WITHOUT CONTRAST TECHNIQUE: Multidetector CT imaging of the head and cervical spine was performed following the standard protocol without intravenous contrast. Multiplanar CT image reconstructions of the cervical spine were also generated. COMPARISON:  08/07/2017 FINDINGS: CT HEAD FINDINGS Brain: Mild superficial atrophy. No acute intracranial hemorrhage, midline shift or edema. No large vascular territory infarct. Minimal chronic small vessel ischemic disease of periventricular white matter. No intra-axial mass nor extra-axial fluid collections. Midline fourth ventricle and basal cisterns. Vascular: No hyperdense vessel sign. Atherosclerotic calcifications of the carotid siphons. Skull: No acute skull fracture.  No suspicious osseous lesions. Sinuses/Orbits: No acute finding. Other: Left frontal scalp hematoma. CT CERVICAL SPINE FINDINGS Alignment: Reversal of cervical lordosis. Osteoarthritis of  the atlantodental interval. Intact craniocervical relationship. Skull base and vertebrae: No acute cervical spine fracture. No jumped or perched facets. Intact skull base. Soft tissues and spinal canal: No prevertebral soft tissue swelling. No visible canal hematoma. Disc levels: Mild disc space narrowing C2 through C4 and C5-6 with marked disc space narrowing at C4-5, C6-7 and C7-T1. Degenerative facet arthropathy is noted bilaterally at C2-3 and C3-4 on the left at C5-6 on the right. Associated uncovertebral joint osteoarthritis with uncinate spurring is seen bilaterally at C3-4, C4-5 and C6-7. Upper chest: Negative. Other: None IMPRESSION: 1. Chronic mild superficial atrophy and small vessel ischemic disease. No acute intracranial abnormality. 2. Mild left frontal scalp contusion.  No underlying skull fracture. 3. 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 Electronically Signed   By: Ashley Royalty M.D.   On: 09/02/2017 23:54   Dg Chest Port 1 View  Result Date: 09/10/2017 CLINICAL DATA:  Follow-up pneumonia EXAM: PORTABLE CHEST 1 VIEW COMPARISON:  09/09/2017 FINDINGS: Cardiac shadow is stable. Left pleural effusion and underlying infiltrate is again noted stable from the prior exam. Some slight increase in right-sided pleural effusion is noted as well. No bony abnormality is seen. IMPRESSION: Increasing bibasilar changes left greater than right Electronically Signed   By: Inez Catalina M.D.   On: 09/10/2017 08:15   Dg Chest Port 1 View  Result Date: 09/09/2017 CLINICAL DATA:  Shortness of breath and wheezing EXAM: PORTABLE CHEST 1 VIEW COMPARISON:  September 06, 2017 FINDINGS: There is airspace consolidation throughout the left mid lower lung zones with small left pleural effusion. There is new patchy infiltrate in the right base and to a lesser extent in the right upper lobe. Heart is upper normal in size with pulmonary vascularity within normal limits. No adenopathy. There is aortic  atherosclerosis. No evident bone lesions. IMPRESSION: Persistent airspace consolidation throughout portions of the left mid lower lung zones with small left pleural effusion. New patchy infiltrate in the right upper lobe and right base regions. Suspect multifocal pneumonia. Stable cardiac silhouette. There is aortic atherosclerosis. Aortic Atherosclerosis (ICD10-I70.0). Electronically Signed   By: Lowella Grip III M.D.   On: 09/09/2017 09:35   Dg Chest Port 1 View  Result Date: 09/06/2017 CLINICAL DATA:  Fever EXAM: PORTABLE CHEST 1 VIEW COMPARISON:  08/07/2017 FINDINGS: Consolidation in the left lower lobe compatible with pneumonia. Possible small left effusion. Right lung clear. Heart is upper limits normal in size. IMPRESSION: Left lower lobe pneumonia.  Possible small left effusion. Electronically Signed   By: Rolm Baptise M.D.   On: 09/06/2017 07:54    Echo Study Conclusions  - Left ventricle: The cavity size was normal. Systolic function was normal. The estimated ejection fraction was in the range of 55% to 60%. Wall motion was normal;  there were no regional wall motion abnormalities. Doppler parameters are consistent with abnormal left ventricular relaxation (grade 1 diastolic dysfunction). - Mitral valve: Calcification of the submitral structures of the posterior leaflet. There was mild regurgitation. - Left atrium: The atrium was moderately dilated. - Pulmonary arteries: Systolic pressure was mildly increased. PA peak pressure: 37 mm Hg (S). - Pericardium, extracardiac: A trivial pericardial effusion was identified. There was a left pleural effusion.     Subjective: Patient seen and examined at bedside.  She feels much better than yesterday and feels ready for discharge.  She is still weak though.  No overnight fever or vomiting.  Discharge Exam: Vitals:   09/14/17 0808 09/14/17 0835  BP:    Pulse:    Resp:    Temp: 98.1 F (36.7 C)   SpO2:  96%    Vitals:   09/14/17 0400 09/14/17 0500 09/14/17 0808 09/14/17 0835  BP: 123/62     Pulse: 80     Resp: (!) 21     Temp: 98.2 F (36.8 C)  98.1 F (36.7 C)   TempSrc: Oral     SpO2: 96%   96%  Weight:  78.2 kg (172 lb 6.4 oz)    Height:        General: Pt is alert, awake, not in acute distress Cardiovascular: Mild tachycardia, S1/S2 + Respiratory: Bilateral decreased breath sounds at bases  abdominal: Soft, NT, ND, bowel sounds + Extremities: no edema, no cyanosis    The results of significant diagnostics from this hospitalization (including imaging, microbiology, ancillary and laboratory) are listed below for reference.     Microbiology: Recent Results (from the past 240 hour(s))  Culture, Urine     Status: Abnormal   Collection Time: 09/06/17  9:34 AM  Result Value Ref Range Status   Specimen Description URINE, CLEAN CATCH  Final   Special Requests   Final    NONE Performed at [facility], [address] [place], [place] [address]    Culture MULTIPLE SPECIES PRESENT, SUGGEST RECOLLECTION (A)  Final   Report Status 09/07/2017 FINAL  Final  Culture, blood (Routine X 2) w Reflex to ID Panel     Status: None   Collection Time: 09/06/17  9:40 AM  Result Value Ref Range Status   Specimen Description BLOOD RIGHT ANTECUBITAL  Final   Special Requests   Final    IN PEDIATRIC BOTTLE Blood Culture results may not be optimal due to an excessive volume of blood received in culture bottles   Culture   Final    NO GROWTH 5 DAYS Performed at [facility], [place] [address] [place], [place] [address]    Report Status 09/11/2017 FINAL  Final  Culture, blood (Routine X 2) w Reflex to ID Panel     Status: None   Collection Time: 09/06/17  9:50 AM  Result Value Ref Range Status   Specimen Description BLOOD RIGHT ANTECUBITAL  Final   Special Requests   Final    BOTTLES DRAWN AEROBIC AND ANAEROBIC Blood Culture adequate volume   Culture   Final    NO GROWTH 5  DAYS Performed at [facility], [place] [address], [place], [place] [address]    Report Status 09/11/2017 FINAL  Final  MRSA PCR Screening     Status: None   Collection Time: 09/06/17 11:58 AM  Result Value Ref Range Status   MRSA by PCR NEGATIVE NEGATIVE Final    Comment:  The GeneXpert MRSA Assay (FDA approved for NASAL specimens only), is one component of a comprehensive MRSA colonization surveillance program. It is not intended to diagnose MRSA infection nor to guide or monitor treatment for MRSA infections. Performed at [facility], [place] [address], [place], [place] [address]      Labs: BNP (last 3 results) No results for input(s): BNP in the last 8760 hours. Basic Metabolic Panel: Recent Labs  Lab 09/10/17 0412 09/11/17 0425 09/12/17 0414 09/13/17 0403 09/14/17 0412  NA 138 138 140 138 140  K 3.6 3.2* 3.8 3.7 3.4*  CL 98* 91* 94* 95* 95*  CO2 29 38* 33* 31 33*  GLUCOSE 98 101* 117* 116* 120*  BUN 6 5* 9 6 13   CREATININE 0.49 0.46 0.52 0.54 0.56  CALCIUM 8.4* 8.4* 8.8* 8.9 8.9  MG 2.3 1.9 2.1 2.2 2.3   Liver Function Tests: Recent Labs  Lab 09/09/17 0938  AST 19  ALT 13*  ALKPHOS 84  BILITOT 0.3  PROT 5.4*  ALBUMIN 2.2*   No results for input(s): LIPASE, AMYLASE in the last 168 hours. Recent Labs  Lab 09/09/17 0943  AMMONIA 33   CBC: Recent Labs  Lab 09/10/17 0412 09/11/17 0425 09/12/17 0414 09/13/17 0403 09/14/17 0412  WBC 13.3* 11.4* 9.1 10.5 11.4*  HGB 9.3* 9.9* 9.9* 10.5* 10.8*  HCT 30.4* 31.8* 32.0* 34.0* 34.7*  MCV 94.4 94.6 94.4 93.2 92.3  PLT 217 218 245 257 261   Cardiac Enzymes: Recent Labs  Lab 09/09/17 0938  TROPONINI <0.03   BNP: Invalid input(s): POCBNP CBG: Recent Labs  Lab 09/09/17 0203  GLUCAP 132*   D-Dimer No results for input(s): DDIMER in the last 72 hours. Hgb A1c No results for input(s): HGBA1C in the last 72 hours. Lipid Profile No results for input(s): CHOL, HDL, LDLCALC, TRIG,  CHOLHDL, LDLDIRECT in the last 72 hours. Thyroid function studies No results for input(s): TSH, T4TOTAL, T3FREE, THYROIDAB in the last 72 hours.  Invalid input(s): FREET3 Anemia work up No results for input(s): VITAMINB12, FOLATE, FERRITIN, TIBC, IRON, RETICCTPCT in the last 72 hours. Urinalysis    Component Value Date/Time   COLORURINE YELLOW 09/06/2017 0721   APPEARANCEUR HAZY (A) 09/06/2017 0721   LABSPEC 1.023 09/06/2017 0721   PHURINE 5.0 09/06/2017 0721   GLUCOSEU NEGATIVE 09/06/2017 0721   HGBUR NEGATIVE 09/06/2017 0721   BILIRUBINUR NEGATIVE 09/06/2017 0721   KETONESUR 20 (A) 09/06/2017 0721   PROTEINUR NEGATIVE 09/06/2017 0721   UROBILINOGEN 0.2 10/20/2007 2015   NITRITE POSITIVE (A) 09/06/2017 0721   LEUKOCYTESUR NEGATIVE 09/06/2017 0721   Sepsis Labs Invalid input(s): PROCALCITONIN,  WBC,  LACTICIDVEN Microbiology Recent Results (from the past 240 hour(s))  Culture, Urine     Status: Abnormal   Collection Time: 09/06/17  9:34 AM  Result Value Ref Range Status   Specimen Description URINE, CLEAN CATCH  Final   Special Requests   Final    NONE Performed at [facility], [place] [address], [place], [place] [address]    Culture MULTIPLE SPECIES PRESENT, SUGGEST RECOLLECTION (A)  Final   Report Status 09/07/2017 FINAL  Final  Culture, blood (Routine X 2) w Reflex to ID Panel     Status: None   Collection Time: 09/06/17  9:40 AM  Result Value Ref Range Status   Specimen Description BLOOD RIGHT ANTECUBITAL  Final   Special Requests   Final    IN PEDIATRIC BOTTLE Blood Culture results may not be optimal  due to an excessive volume of blood received in culture bottles   Culture   Final    NO GROWTH 5 DAYS Performed at [facility], [place] [address] [place], [place] [address]    Report Status 09/11/2017 FINAL  Final  Culture, blood (Routine X 2) w Reflex to ID Panel     Status: None   Collection Time: 09/06/17  9:50 AM  Result Value Ref Range Status    Specimen Description BLOOD RIGHT ANTECUBITAL  Final   Special Requests   Final    BOTTLES DRAWN AEROBIC AND ANAEROBIC Blood Culture adequate volume   Culture   Final    NO GROWTH 5 DAYS Performed at [facility], [place] [address] [place], [place] [address]    Report Status 09/11/2017 FINAL  Final  MRSA PCR Screening     Status: None   Collection Time: 09/06/17 11:58 AM  Result Value Ref Range Status   MRSA by PCR NEGATIVE NEGATIVE Final    Comment:        The GeneXpert MRSA Assay (FDA approved for NASAL specimens only), is one component of a comprehensive MRSA colonization surveillance program. It is not intended to diagnose MRSA infection nor to guide or monitor treatment for MRSA infections. Performed at [facility], [place] [address] [place],  [address]      Time coordinating discharge: 35 minutes  SIGNED:   Aline August, MD  [facility] 09/14/2017, 11:41 AM Pager: [phone]  If 7PM-7AM, please contact night-coverage [email or web address] Password [email or web address]

## 2017-09-14 NOTE — Clinical Social Work Placement (Signed)
Nurse to call report to [phone], ask for [facility] room [place]  NOTE  Date:  09/14/2017  Patient Details  Name: MYEASHA BALLOWE MRN: [id number] Date of Birth: September 16, 1940  Clinical Social Work is seeking post-discharge placement for this patient at the [place] level of care (*CSW will initial, date and re-position this form in  chart as items are completed):  Yes   Patient/family provided with [place] Work Department's list of facilities offering this level of care within the geographic area requested by the patient (or if unable, by the patient's family).  Yes   Patient/family informed of their freedom to choose among providers that offer the needed level of care, that participate in Medicare, Medicaid or managed care program needed by the patient, have an available bed and are willing to accept the patient.  Yes   Patient/family informed of [place]'s ownership interest in [facility] and [facility], as well as of the fact that they are under no obligation to receive care at these facilities.  PASRR submitted to EDS on       PASRR number received on 09/12/17     Existing PASRR number confirmed on       FL2 transmitted to all facilities in geographic area requested by pt/family on 09/08/17     FL2 transmitted to all facilities within larger geographic area on       Patient informed that his/her managed care company has contracts with or will negotiate with certain facilities, including the following:        Yes   Patient/family informed of bed offers received.  Patient chooses bed at [facility]     Physician recommends and patient chooses bed at      Patient to be transferred to [facility] on  .  Patient to be transferred to facility by [facility]     Patient family notified on   of transfer.  Name of family member notified:  Clair Gulling, husband     PHYSICIAN Please prepare priority  discharge summary, including medications     Additional Comment:    _______________________________________________ Geralynn Ochs, LCSW 09/14/2017, 2:41 PM

## 2017-09-15 DIAGNOSIS — K219 Gastro-esophageal reflux disease without esophagitis: Secondary | ICD-10-CM | POA: Diagnosis not present

## 2017-09-15 DIAGNOSIS — K5790 Diverticulosis of intestine, part unspecified, without perforation or abscess without bleeding: Secondary | ICD-10-CM | POA: Diagnosis not present

## 2017-09-15 DIAGNOSIS — J189 Pneumonia, unspecified organism: Secondary | ICD-10-CM | POA: Diagnosis not present

## 2017-09-15 DIAGNOSIS — R69 Illness, unspecified: Secondary | ICD-10-CM | POA: Diagnosis not present

## 2017-09-16 DIAGNOSIS — R69 Illness, unspecified: Secondary | ICD-10-CM | POA: Diagnosis not present

## 2017-09-16 DIAGNOSIS — K219 Gastro-esophageal reflux disease without esophagitis: Secondary | ICD-10-CM | POA: Diagnosis not present

## 2017-09-16 DIAGNOSIS — J189 Pneumonia, unspecified organism: Secondary | ICD-10-CM | POA: Diagnosis not present

## 2017-09-16 DIAGNOSIS — Z8719 Personal history of other diseases of the digestive system: Secondary | ICD-10-CM | POA: Diagnosis not present

## 2017-09-23 DIAGNOSIS — K5790 Diverticulosis of intestine, part unspecified, without perforation or abscess without bleeding: Secondary | ICD-10-CM | POA: Diagnosis not present

## 2017-09-23 DIAGNOSIS — K5900 Constipation, unspecified: Secondary | ICD-10-CM | POA: Diagnosis not present

## 2017-09-23 DIAGNOSIS — L290 Pruritus ani: Secondary | ICD-10-CM | POA: Diagnosis not present

## 2017-09-23 DIAGNOSIS — H409 Unspecified glaucoma: Secondary | ICD-10-CM | POA: Diagnosis not present

## 2017-10-01 DIAGNOSIS — J189 Pneumonia, unspecified organism: Secondary | ICD-10-CM | POA: Diagnosis not present

## 2017-10-01 DIAGNOSIS — R221 Localized swelling, mass and lump, neck: Secondary | ICD-10-CM | POA: Diagnosis not present

## 2017-10-01 DIAGNOSIS — R69 Illness, unspecified: Secondary | ICD-10-CM | POA: Diagnosis not present

## 2017-10-01 DIAGNOSIS — K5790 Diverticulosis of intestine, part unspecified, without perforation or abscess without bleeding: Secondary | ICD-10-CM | POA: Diagnosis not present

## 2017-10-02 DIAGNOSIS — K047 Periapical abscess without sinus: Secondary | ICD-10-CM | POA: Diagnosis not present

## 2017-10-02 DIAGNOSIS — R221 Localized swelling, mass and lump, neck: Secondary | ICD-10-CM | POA: Diagnosis not present

## 2017-10-06 DIAGNOSIS — J209 Acute bronchitis, unspecified: Secondary | ICD-10-CM | POA: Diagnosis not present

## 2017-10-06 DIAGNOSIS — H409 Unspecified glaucoma: Secondary | ICD-10-CM | POA: Diagnosis not present

## 2017-10-06 DIAGNOSIS — Z9181 History of falling: Secondary | ICD-10-CM | POA: Diagnosis not present

## 2017-10-06 DIAGNOSIS — Z8719 Personal history of other diseases of the digestive system: Secondary | ICD-10-CM | POA: Diagnosis not present

## 2017-10-06 DIAGNOSIS — J9601 Acute respiratory failure with hypoxia: Secondary | ICD-10-CM | POA: Diagnosis not present

## 2017-10-06 DIAGNOSIS — J09X2 Influenza due to identified novel influenza A virus with other respiratory manifestations: Secondary | ICD-10-CM | POA: Diagnosis not present

## 2017-10-06 DIAGNOSIS — R5381 Other malaise: Secondary | ICD-10-CM | POA: Diagnosis not present

## 2017-10-06 DIAGNOSIS — I10 Essential (primary) hypertension: Secondary | ICD-10-CM | POA: Diagnosis not present

## 2017-10-06 DIAGNOSIS — E785 Hyperlipidemia, unspecified: Secondary | ICD-10-CM | POA: Diagnosis not present

## 2017-10-06 DIAGNOSIS — R69 Illness, unspecified: Secondary | ICD-10-CM | POA: Diagnosis not present

## 2017-10-08 MED FILL — SHINGRIX 50 MCG SUS: 50 | 1 days supply | Qty: 1 | Fill #0

## 2017-10-09 DIAGNOSIS — K922 Gastrointestinal hemorrhage, unspecified: Secondary | ICD-10-CM | POA: Diagnosis not present

## 2017-10-09 DIAGNOSIS — J189 Pneumonia, unspecified organism: Secondary | ICD-10-CM | POA: Diagnosis not present

## 2017-10-09 DIAGNOSIS — R69 Illness, unspecified: Secondary | ICD-10-CM | POA: Diagnosis not present

## 2017-10-09 DIAGNOSIS — Z6833 Body mass index (BMI) 33.0-33.9, adult: Secondary | ICD-10-CM | POA: Diagnosis not present

## 2017-10-10 DIAGNOSIS — Z8719 Personal history of other diseases of the digestive system: Secondary | ICD-10-CM | POA: Diagnosis not present

## 2017-10-10 DIAGNOSIS — R69 Illness, unspecified: Secondary | ICD-10-CM | POA: Diagnosis not present

## 2017-10-10 DIAGNOSIS — H409 Unspecified glaucoma: Secondary | ICD-10-CM | POA: Diagnosis not present

## 2017-10-10 DIAGNOSIS — Z9181 History of falling: Secondary | ICD-10-CM | POA: Diagnosis not present

## 2017-10-10 DIAGNOSIS — R5381 Other malaise: Secondary | ICD-10-CM | POA: Diagnosis not present

## 2017-10-10 DIAGNOSIS — J09X2 Influenza due to identified novel influenza A virus with other respiratory manifestations: Secondary | ICD-10-CM | POA: Diagnosis not present

## 2017-10-10 DIAGNOSIS — J9601 Acute respiratory failure with hypoxia: Secondary | ICD-10-CM | POA: Diagnosis not present

## 2017-10-10 DIAGNOSIS — J209 Acute bronchitis, unspecified: Secondary | ICD-10-CM | POA: Diagnosis not present

## 2017-10-10 DIAGNOSIS — E785 Hyperlipidemia, unspecified: Secondary | ICD-10-CM | POA: Diagnosis not present

## 2017-10-10 DIAGNOSIS — I10 Essential (primary) hypertension: Secondary | ICD-10-CM | POA: Diagnosis not present

## 2017-10-13 DIAGNOSIS — R5381 Other malaise: Secondary | ICD-10-CM | POA: Diagnosis not present

## 2017-10-13 DIAGNOSIS — E785 Hyperlipidemia, unspecified: Secondary | ICD-10-CM | POA: Diagnosis not present

## 2017-10-13 DIAGNOSIS — Z9181 History of falling: Secondary | ICD-10-CM | POA: Diagnosis not present

## 2017-10-13 DIAGNOSIS — Z8719 Personal history of other diseases of the digestive system: Secondary | ICD-10-CM | POA: Diagnosis not present

## 2017-10-13 DIAGNOSIS — R69 Illness, unspecified: Secondary | ICD-10-CM | POA: Diagnosis not present

## 2017-10-13 DIAGNOSIS — I10 Essential (primary) hypertension: Secondary | ICD-10-CM | POA: Diagnosis not present

## 2017-10-13 DIAGNOSIS — J9601 Acute respiratory failure with hypoxia: Secondary | ICD-10-CM | POA: Diagnosis not present

## 2017-10-13 DIAGNOSIS — J09X2 Influenza due to identified novel influenza A virus with other respiratory manifestations: Secondary | ICD-10-CM | POA: Diagnosis not present

## 2017-10-13 DIAGNOSIS — H409 Unspecified glaucoma: Secondary | ICD-10-CM | POA: Diagnosis not present

## 2017-10-13 DIAGNOSIS — J209 Acute bronchitis, unspecified: Secondary | ICD-10-CM | POA: Diagnosis not present

## 2017-10-14 DIAGNOSIS — R69 Illness, unspecified: Secondary | ICD-10-CM | POA: Diagnosis not present

## 2017-10-14 DIAGNOSIS — J209 Acute bronchitis, unspecified: Secondary | ICD-10-CM | POA: Diagnosis not present

## 2017-10-14 DIAGNOSIS — R5381 Other malaise: Secondary | ICD-10-CM | POA: Diagnosis not present

## 2017-10-14 DIAGNOSIS — E785 Hyperlipidemia, unspecified: Secondary | ICD-10-CM | POA: Diagnosis not present

## 2017-10-14 DIAGNOSIS — J09X2 Influenza due to identified novel influenza A virus with other respiratory manifestations: Secondary | ICD-10-CM | POA: Diagnosis not present

## 2017-10-14 DIAGNOSIS — I10 Essential (primary) hypertension: Secondary | ICD-10-CM | POA: Diagnosis not present

## 2017-10-14 DIAGNOSIS — Z9181 History of falling: Secondary | ICD-10-CM | POA: Diagnosis not present

## 2017-10-14 DIAGNOSIS — J9601 Acute respiratory failure with hypoxia: Secondary | ICD-10-CM | POA: Diagnosis not present

## 2017-10-14 DIAGNOSIS — Z8719 Personal history of other diseases of the digestive system: Secondary | ICD-10-CM | POA: Diagnosis not present

## 2017-10-14 DIAGNOSIS — H409 Unspecified glaucoma: Secondary | ICD-10-CM | POA: Diagnosis not present

## 2017-10-15 DIAGNOSIS — R1310 Dysphagia, unspecified: Secondary | ICD-10-CM | POA: Diagnosis not present

## 2017-10-15 DIAGNOSIS — J189 Pneumonia, unspecified organism: Secondary | ICD-10-CM | POA: Diagnosis not present

## 2017-10-15 DIAGNOSIS — K922 Gastrointestinal hemorrhage, unspecified: Secondary | ICD-10-CM | POA: Diagnosis not present

## 2017-10-16 DIAGNOSIS — I10 Essential (primary) hypertension: Secondary | ICD-10-CM | POA: Diagnosis not present

## 2017-10-16 DIAGNOSIS — Z8719 Personal history of other diseases of the digestive system: Secondary | ICD-10-CM | POA: Diagnosis not present

## 2017-10-16 DIAGNOSIS — R5381 Other malaise: Secondary | ICD-10-CM | POA: Diagnosis not present

## 2017-10-16 DIAGNOSIS — J09X2 Influenza due to identified novel influenza A virus with other respiratory manifestations: Secondary | ICD-10-CM | POA: Diagnosis not present

## 2017-10-16 DIAGNOSIS — Z9181 History of falling: Secondary | ICD-10-CM | POA: Diagnosis not present

## 2017-10-16 DIAGNOSIS — J209 Acute bronchitis, unspecified: Secondary | ICD-10-CM | POA: Diagnosis not present

## 2017-10-16 DIAGNOSIS — J9601 Acute respiratory failure with hypoxia: Secondary | ICD-10-CM | POA: Diagnosis not present

## 2017-10-16 DIAGNOSIS — R69 Illness, unspecified: Secondary | ICD-10-CM | POA: Diagnosis not present

## 2017-10-16 DIAGNOSIS — E785 Hyperlipidemia, unspecified: Secondary | ICD-10-CM | POA: Diagnosis not present

## 2017-10-16 DIAGNOSIS — H409 Unspecified glaucoma: Secondary | ICD-10-CM | POA: Diagnosis not present

## 2017-10-20 DIAGNOSIS — H401132 Primary open-angle glaucoma, bilateral, moderate stage: Secondary | ICD-10-CM | POA: Diagnosis not present

## 2017-10-21 DIAGNOSIS — R05 Cough: Secondary | ICD-10-CM | POA: Diagnosis not present

## 2017-10-21 DIAGNOSIS — Z1231 Encounter for screening mammogram for malignant neoplasm of breast: Secondary | ICD-10-CM | POA: Diagnosis not present

## 2017-10-21 DIAGNOSIS — Z6833 Body mass index (BMI) 33.0-33.9, adult: Secondary | ICD-10-CM | POA: Diagnosis not present

## 2017-10-21 DIAGNOSIS — J209 Acute bronchitis, unspecified: Secondary | ICD-10-CM | POA: Diagnosis not present

## 2017-10-28 DIAGNOSIS — J984 Other disorders of lung: Secondary | ICD-10-CM | POA: Diagnosis not present

## 2017-10-29 DIAGNOSIS — R221 Localized swelling, mass and lump, neck: Secondary | ICD-10-CM | POA: Diagnosis not present

## 2017-10-29 DIAGNOSIS — R41841 Cognitive communication deficit: Secondary | ICD-10-CM | POA: Diagnosis not present

## 2017-10-29 DIAGNOSIS — I10 Essential (primary) hypertension: Secondary | ICD-10-CM | POA: Diagnosis not present

## 2017-10-29 DIAGNOSIS — R2689 Other abnormalities of gait and mobility: Secondary | ICD-10-CM | POA: Diagnosis not present

## 2017-10-29 DIAGNOSIS — M1710 Unilateral primary osteoarthritis, unspecified knee: Secondary | ICD-10-CM | POA: Diagnosis not present

## 2017-10-29 DIAGNOSIS — D649 Anemia, unspecified: Secondary | ICD-10-CM | POA: Diagnosis not present

## 2017-10-29 DIAGNOSIS — J189 Pneumonia, unspecified organism: Secondary | ICD-10-CM | POA: Diagnosis not present

## 2017-10-29 DIAGNOSIS — E785 Hyperlipidemia, unspecified: Secondary | ICD-10-CM | POA: Diagnosis not present

## 2017-10-29 DIAGNOSIS — Z8719 Personal history of other diseases of the digestive system: Secondary | ICD-10-CM | POA: Diagnosis not present

## 2017-10-29 DIAGNOSIS — R69 Illness, unspecified: Secondary | ICD-10-CM | POA: Diagnosis not present

## 2017-11-06 DIAGNOSIS — R69 Illness, unspecified: Secondary | ICD-10-CM | POA: Diagnosis not present

## 2017-11-11 DIAGNOSIS — E785 Hyperlipidemia, unspecified: Secondary | ICD-10-CM | POA: Diagnosis not present

## 2017-11-11 DIAGNOSIS — M1710 Unilateral primary osteoarthritis, unspecified knee: Secondary | ICD-10-CM | POA: Diagnosis not present

## 2017-11-11 DIAGNOSIS — R221 Localized swelling, mass and lump, neck: Secondary | ICD-10-CM | POA: Diagnosis not present

## 2017-11-11 DIAGNOSIS — I10 Essential (primary) hypertension: Secondary | ICD-10-CM | POA: Diagnosis not present

## 2017-11-11 DIAGNOSIS — R2689 Other abnormalities of gait and mobility: Secondary | ICD-10-CM | POA: Diagnosis not present

## 2017-11-11 DIAGNOSIS — D649 Anemia, unspecified: Secondary | ICD-10-CM | POA: Diagnosis not present

## 2017-11-11 DIAGNOSIS — R41841 Cognitive communication deficit: Secondary | ICD-10-CM | POA: Diagnosis not present

## 2017-11-11 DIAGNOSIS — Z8719 Personal history of other diseases of the digestive system: Secondary | ICD-10-CM | POA: Diagnosis not present

## 2017-11-11 DIAGNOSIS — R69 Illness, unspecified: Secondary | ICD-10-CM | POA: Diagnosis not present

## 2017-11-11 DIAGNOSIS — J189 Pneumonia, unspecified organism: Secondary | ICD-10-CM | POA: Diagnosis not present

## 2017-11-18 ENCOUNTER — Encounter: Payer: Self-pay | Admitting: Internal Medicine

## 2017-11-18 ENCOUNTER — Ambulatory Visit: Payer: Medicare HMO | Admitting: Internal Medicine

## 2017-11-18 ENCOUNTER — Other Ambulatory Visit (INDEPENDENT_AMBULATORY_CARE_PROVIDER_SITE_OTHER): Payer: Medicare HMO

## 2017-11-18 VITALS — BP 126/84 | HR 88 | Ht 62.0 in | Wt 183.2 lb

## 2017-11-18 DIAGNOSIS — R0600 Dyspnea, unspecified: Secondary | ICD-10-CM

## 2017-11-18 DIAGNOSIS — R0609 Other forms of dyspnea: Secondary | ICD-10-CM

## 2017-11-18 DIAGNOSIS — J449 Chronic obstructive pulmonary disease, unspecified: Secondary | ICD-10-CM | POA: Insufficient documentation

## 2017-11-18 LAB — BASIC METABOLIC PANEL
BUN: 10 mg/dL (ref 6–23)
CALCIUM: 9.5 mg/dL (ref 8.4–10.5)
CO2: 31 meq/L (ref 19–32)
CREATININE: 0.51 mg/dL (ref 0.40–1.20)
Chloride: 103 mEq/L (ref 96–112)
GFR: 123.64 mL/min (ref >60.00)
GLUCOSE: 121 mg/dL — AB (ref 70–99)
Potassium: 4.4 mEq/L (ref 3.5–5.1)
Sodium: 141 mEq/L (ref 135–145)

## 2017-11-18 LAB — CBC WITH DIFFERENTIAL/PLATELET
BASOS PCT: 0.8 % (ref 0.0–3.0)
Basophils Absolute: 0 10*3/uL (ref 0.0–0.1)
EOS PCT: 3.2 % (ref 0.0–5.0)
Eosinophils Absolute: 0.2 10*3/uL (ref 0.0–0.7)
HEMATOCRIT: 39.7 % (ref 36.0–46.0)
HEMOGLOBIN: 13.1 g/dL (ref 12.0–15.0)
LYMPHS PCT: 36.2 % (ref 12.0–46.0)
Lymphs Abs: 1.9 10*3/uL (ref 0.7–4.0)
MCHC: 33.1 g/dL (ref 30.0–36.0)
MCV: 90.3 fl (ref 78.0–100.0)
Monocytes Absolute: 0.7 10*3/uL (ref 0.1–1.0)
Monocytes Relative: 13.4 % — ABNORMAL HIGH (ref 3.0–12.0)
Neutro Abs: 2.5 10*3/uL (ref 1.4–7.7)
Neutrophils Relative %: 46.4 % (ref 43.0–77.0)
Platelets: 163 10*3/uL (ref 150.0–400.0)
RBC: 4.4 Mil/uL (ref 3.87–5.11)
RDW: 14.3 % (ref 11.5–15.5)
WBC: 5.3 10*3/uL (ref 4.0–10.5)

## 2017-11-18 LAB — HEPATIC FUNCTION PANEL
ALK PHOS: 69 U/L (ref 39–117)
ALT: 11 U/L (ref 0–35)
AST: 18 U/L (ref 0–37)
Albumin: 4 g/dL (ref 3.5–5.2)
BILIRUBIN DIRECT: 0.1 mg/dL (ref 0.0–0.3)
Total Bilirubin: 0.4 mg/dL (ref 0.2–1.2)
Total Protein: 7 g/dL (ref 6.0–8.3)

## 2017-11-18 LAB — BRAIN NATRIURETIC PEPTIDE: Pro B Natriuretic peptide (BNP): 61 pg/mL (ref 0.0–100.0)

## 2017-11-18 LAB — TSH: TSH: 1.88 u[IU]/mL (ref 0.35–4.50)

## 2017-11-18 LAB — SEDIMENTATION RATE: Sed Rate: 24 mm/hr (ref 0–30)

## 2017-11-18 MED ORDER — UMECLIDINIUM-VILANTEROL 62.5-25 MCG/INH IN AEPB: 1.0000 | INHALATION_SPRAY | Freq: Every day | RESPIRATORY_TRACT | 11 refills | Status: DC

## 2017-11-18 NOTE — Patient Instructions (Signed)
Anoro one click each am - take two good drags off of one click  - fill the prescription if you feel it helps   Please remember to go to the lab department downstairs in the basement  for your tests - we will call you with the results when they are available.      Please schedule a follow up visit in 3  Weeks but call sooner if needed

## 2017-11-18 NOTE — Progress Notes (Signed)
Subjective:     Patient ID: [name], female   DOB: Nov 30, 1940,     MRN: [id number]  HPI  46 yowf quit smoking 1994  With no resp limitations but around 2016 p episode of bad "bronchitis" noted doe and starting using saba but rarely needing but much worse since req admit   Admit date: 08/07/2017 Discharge date: 08/10/2017  Discharge Diagnoses:  Principal Problem:   Acute respiratory failure with hypoxia ([place]) Active Problems:   Influenza A   Anxiety   Depression   Hyperlipemia   Macular degeneration   Glaucoma   Weakness   Dehydration   Felt back to baseline / rare saba need   Admit date: 09/05/2017 Discharge date: 09/14/2017  Brief/Interim Summary: 77 year old female with history of anxiety, depression, GERD, arthritis, tobacco abuse and diverticulosis with multiple episodes of diverticular bleeding including most recently in April 2018 presented on 09/05/2017 with bright red blood per rectum and drop in her hemoglobin from 12.9 to 9.4. GI was consulted. Overnight from 09/05/2017-09/06/2017, she spiked a fever to 102.6 and became tachycardic. She was started on antibiotics for pneumonia.  Her bleeding is stopped and hemoglobin has remained stable.  GI has signed off.  She was treated with a course of intravenous antibiotics, cultures have remained negative.  She was initially requiring oxygen but currently is on room air.  Antibiotics were discontinued on 09/13/2017.  She is weak and deconditioned.  She will be discharged to nursing home once bed is available.   Discharge Diagnoses:  Active Problems:   Diverticulosis of intestine with bleeding  Acute hypoxic and hypercapnic respiratory failure due to RLL pneumonia -Resolved.  Currently on room air.  Patient initially required BiPAP which was weaned to nasal cannula oxygen.  I -She has received4 doses of Lasix so far, one each onthe last 4consecutive days.  No need for further Lasix.  Might try low-dose Lasix in the  nursing home if patient becomes tachypneic -Continue incentive spirometry.  Patient might benefit from outpatient repeat chest x-ray in a week.  Also might benefit from outpatient pulmonary evaluation. -Echo showed EF of 55-60% with grade 1 diastolic dysfunction  Multifocal pneumonia with bilateral pleural effusion probable continue Unasyn and azithromycin -Initially was on Unasyn and Zithromax which was switched to Zosyn and Zithromax on 09/10/2011. Of Zithromax since 09/11/2017. Cultures negative so far.  Antibiotics were discontinued yesterday on 09/13/2017 on day #8. - Continue albuterol -Urine streptococcal antigen negative.  Acute metabolic encephalopathy, likely due to hypercapnea.  -Improved.    Sepsis -Resolved.Cultures negative so far. Hemodynamically stable. -Antibiotic plan as above  Leukocytosis -Improved  Acute blood loss anemia secondary to diverticular bleeding -Hemoglobin10.8 today. Bleeding has stopped. GI has signed off. -Transfused1 unit PRBC on 09/08/17 -Outpatient follow-up with GI  Generalized deconditioning -Continue PT in the nursing home  Hyperlipidemia, Stable, continue simvastatin  Glaucoma with macular degeneration, stable, continued eyedrops  Hypertension -Blood Pressure stable. Monitor   Hypokalemia -Outpatient follow-up     D/c [facility] p 20 days p d/c improved doe but not back to baseline     11/18/2017 1st [facility] office visit/ Tyronn Golda   GOLD II copd criteria  Chief Complaint  Patient presents with  . Pulmonary Consult    Referred by [facility] for eval of COPD. She states she was hospitalized in March 2019 with PNA and she was told she had COPD. She states today her breathing is overall doing well. She feels like she has some "congestion in  my bronchial tubes".  She gets winded walking approx 4 isles at the grocery store.  She has occ cough, mainly non prod. She uses ventolin approx 2 daily.   also limted by  knees/energy/ no longer able to finish shopping at [facility] s giving out Doe = [facility] = can't walk 100 yards even at a slow pace at a flat grade s stopping due to sob /fatigue  Cough minimum sputum , day not noct and sporadic - sev times a week/ not   every day   One pillow comfortable  No better p trial of breo while on inderal, timolol eyedrops    No obvious day to day or daytime variability or assoc excess/ purulent sputum or mucus plugs or hemoptysis or cp or chest tightness, subjective wheeze or overt sinus or hb symptoms. No unusual exposure hx or h/o childhood pna/ asthma or knowledge of premature birth.  Sleeping  Ok on one pillow   without nocturnal  or early am exacerbation  of respiratory  c/o's or need for noct saba. Also denies any obvious fluctuation of symptoms with weather or environmental changes or other aggravating or alleviating factors except as outlined above   Current Allergies, Complete Past Medical History, Past Surgical History, Family History, and Social History were reviewed in [employer] record.  ROS  The following are not active complaints unless bolded Hoarseness, sore throat, dysphagia, dental problems, itching, sneezing,  nasal congestion or discharge of excess mucus or purulent secretions, ear ache,   fever, chills, sweats, unintended wt loss or wt gain, classically pleuritic or exertional cp,  orthopnea pnd or arm/hand swelling  or leg swelling, presyncope, palpitations, abdominal pain, anorexia, nausea, vomiting, diarrhea  or change in bowel habits or change in bladder habits, change in stools or change in urine, dysuria, hematuria,  rash, arthralgias, visual complaints, headache, numbness, weakness or ataxia or problems with walking or coordination,  change in mood or  memory.        Current Meds  Medication Sig  . acetaminophen (TYLENOL) 500 MG tablet Take 1,000 mg by mouth daily as needed for headache.  . albuterol (PROVENTIL HFA;VENTOLIN  HFA) 108 (90 Base) MCG/ACT inhaler Inhale 2 puffs into the lungs daily as needed. For seasonal allergies . Use 2 puffs 3 times daily x 4 days then back to home regimen.  Marland Kitchen ALPRAZolam (XANAX) 0.25 MG tablet Take 1 tablet (0.25 mg total) by mouth daily as needed for anxiety.  . cholecalciferol (VITAMIN D) 1000 UNITS tablet Take 2,000 Units by mouth 2 (two) times daily.   . dorzolamide-timolol (COSOPT) 22.3-6.8 MG/ML ophthalmic solution Place 1 drop into both eyes 2 (two) times daily.  . fluticasone (FLONASE) 50 MCG/ACT nasal spray Place 2 sprays into both nostrils daily.  Marland Kitchen guaiFENesin (MUCINEX) 600 MG 12 hr tablet Take 600 mg by mouth 2 (two) times daily.  Marland Kitchen loratadine (CLARITIN) 10 MG tablet Take 1 tablet (10 mg total) by mouth daily.  . Multiple Vitamin (MULTIVITAMIN WITH MINERALS) TABS Take 1 tablet by mouth daily.  . Multiple Vitamins-Minerals (ICAPS AREDS 2) CAPS Take 1 capsule by mouth 2 (two) times daily.  Marland Kitchen omeprazole (PRILOSEC) 20 MG capsule Take 20 mg by mouth daily.   . OXYGEN 2lpm with sleep only  [employer]  . polyethylene glycol (MIRALAX / GLYCOLAX) packet Take 17 g by mouth daily.  . propranolol (INDERAL) 20 MG tablet Take 20 mg by mouth daily.  . sertraline (ZOLOFT) 100 MG tablet Take 100 mg  by mouth daily.  . simvastatin (ZOCOR) 20 MG tablet Take 20 mg by mouth daily.  . vitamin C (ASCORBIC ACID) 500 MG tablet Take 1,000 mg by mouth daily.           Review of Systems     Objective:   Physical Exam    amb wf nad  Wt Readings from Last 3 Encounters:  11/18/17 183 lb 3.2 oz (83.1 kg)  09/14/17 172 lb 6.4 oz (78.2 kg)  10/16/16 187 lb (84.8 kg)     Vital signs reviewed - Note on arrival 02 sats  93% on RA      HEENT: nl dentition, turbinates bilaterally, and oropharynx. Nl external ear canals without cough reflex   NECK :  without JVD/Nodes/TM/ nl carotid upstrokes bilaterally   LUNGS: no acc muscle use,  Nl contour chest which is clear to A and P bilaterally  without cough on insp or exp maneuvers   CV:  RRR  no s3 or murmur or increase in P2, and tight elastic elastic hose/ ? Pitting both ankles    ABD:  soft and nontender with nl inspiratory excursion in the supine position. No bruits or organomegaly appreciated, bowel sounds nl  MS:  Nl gait/ ext warm without deformities, calf tenderness, cyanosis or clubbing No obvious joint restrictions   SKIN: warm and dry without lesions    NEURO:  alert, approp, nl sensorium with  no motor or cerebellar deficits apparent.          I personally reviewed images and agree with radiology impression as follows:  CXR:   09/14/17    I personally reviewed images and agree with radiology impression as follows:    Persistent moderate left pleural effusion with underlying opacities.  cxr repeated Oct 29 2017 per Dr Joylene Draft "almost completely cleared" per pt report    Labs ordered/ reviewed:      Chemistry      Component Value Date/Time   NA 141 11/18/2017 1604   K 4.4 11/18/2017 1604   CL 103 11/18/2017 1604   CO2 31 11/18/2017 1604   BUN 10 11/18/2017 1604   CREATININE 0.51 11/18/2017 1604      Component Value Date/Time   CALCIUM 9.5 11/18/2017 1604   ALKPHOS 69 11/18/2017 1604   AST 18 11/18/2017 1604   ALT 11 11/18/2017 1604   BILITOT 0.4 11/18/2017 1604        Lab Results  Component Value Date   WBC 5.3 11/18/2017   HGB 13.1 11/18/2017   HCT 39.7 11/18/2017   MCV 90.3 11/18/2017   PLT 163.0 11/18/2017     Lab Results  Component Value Date   DDIMER 0.57 (H) 11/18/2017      Lab Results  Component Value Date   TSH 1.88 11/18/2017     Lab Results  Component Value Date   PROBNP 61.0 11/18/2017       Lab Results  Component Value Date   ESRSEDRATE 24 11/18/2017             Assessment:

## 2017-11-19 ENCOUNTER — Encounter: Payer: Self-pay | Admitting: Internal Medicine

## 2017-11-19 LAB — D-DIMER, QUANTITATIVE (NOT AT ARMC): D DIMER QUANT: 0.57 ug{FEU}/mL — AB (ref <0.50)

## 2017-11-19 NOTE — Progress Notes (Signed)
Spoke with pt and notified of results per Dr. Wert. Pt verbalized understanding and denied any questions. 

## 2017-11-19 NOTE — Assessment & Plan Note (Addendum)
Spirometry 11/18/2017  FEV1 0.99 (52%)  Ratio 69 on inderol/ timolol and no resp rx - 11/18/2017  After extensive coaching inhaler device  effectiveness =  90% dpi  > try anoro    Apparently Pt is Group B in terms of symptom/risk and laba/lama therefore appropriate rx at this point trial basis though not we have not excluded an element of chronic asthma here - if BB's contributing she'll do better adding LAMA next so we'll see    Total time devoted to counseling  > 50 % of initial 60 min office visit:  review case with pt/ discussion of options/alternatives/ personally creating written customized instructions  in presence of pt  then going over those specific  Instructions directly with the pt including how to use all of the meds but in particular covering each new medication in detail and the difference between the maintenance= "automatic" meds and the prns using an action plan format for the latter (If this problem/symptom => do that organization reading Left to right).  Please see AVS from this visit for a full list of these instructions which I personally wrote for this pt and  are unique to this visit.    See device teaching which extended face to face time for this visit

## 2017-11-19 NOTE — Assessment & Plan Note (Addendum)
11/18/2017   Walked RA  2 laps @ 185 ft each stopped due to  Sob/fatigue  with sats ok   Symptoms are   disproportionate to objective findings and not clear to what extent this is all a pulmonary  problem but pt does appear to have difficult to sort out respiratory symptoms of unknown origin for which  DDX  = almost all start with A and  include Adherence, Ace Inhibitors, Acid Reflux, Active Sinus Disease, Alpha 1 Antitripsin deficiency, Anxiety masquerading as Airways dz,  ABPA,  Allergy(esp in young), Aspiration (esp in elderly), Adverse effects of meds,  Active smokers, A bunch of PE's/clot burden (a few small clots can't cause this syndrome unless there is already severe underlying pulm or vascular dz with poor reserve),  Anemia or thyroid disorder, plus two Bs  = Bronchiectasis and Beta blocker use..and one C= CHF     Adherence is always the initial "prime suspect" and is a multilayered concern that requires a "trust but verify" approach in every patient - starting with knowing how to use medications, especially inhalers, correctly, keeping up with refills and understanding the fundamental difference between maintenance and prns vs those medications only taken for a very short course and then stopped and not refilled.  - see hfa teaching   ? Allergy/asthma > nothing in my hx to suggest and may due better with lama/laba than laba/ics in this setting (see separate a/p)   ? Anemia/thyroid dz > ruled out today     ? A bunch of pe's > D dimer nl - while a normal  or high normal value (seen commonly in the elderly or chronically ill)  may miss small peripheral pe, the clot burden with sob is moderately high and the d dimer  has a very high neg pred value if used in this setting.  ? Anxiety/depression / deconditioning  > usually at the bottom of this list of usual suspects but  note already on psychotropics and may interfere with adherence and also interpretation of response or lack thereof to symptom  management which can be quite subjective. Follow up per Primary Care planned    ? Beta blocker effects > certainly possible given on two of the most non-specific BB's on market, inderal po and timoptic eyedrops but flatly refuses to consider changing either one for now ever p I reviewed the package insert info listing dyspnea and the 2nd common most side effect from both (cardiovascular effects were 1st)   ? chf > excluded for all intents and purposes with bnp so low

## 2017-11-21 DIAGNOSIS — K922 Gastrointestinal hemorrhage, unspecified: Secondary | ICD-10-CM | POA: Diagnosis not present

## 2017-12-09 MED FILL — SHINGRIX 50 MCG SUS: 50 | 1 days supply | Qty: 1 | Fill #1

## 2017-12-22 ENCOUNTER — Ambulatory Visit: Payer: Medicare HMO | Admitting: Internal Medicine

## 2017-12-22 ENCOUNTER — Encounter: Payer: Self-pay | Admitting: Internal Medicine

## 2017-12-22 VITALS — BP 106/70 | HR 70 | Ht 62.0 in | Wt 187.0 lb

## 2017-12-22 DIAGNOSIS — J449 Chronic obstructive pulmonary disease, unspecified: Secondary | ICD-10-CM

## 2017-12-22 NOTE — Assessment & Plan Note (Signed)
Spirometry 11/18/2017  FEV1 0.99 (52%)  Ratio 69 on inderol/ timolol and no resp rx - 11/18/2017  After extensive coaching inhaler device  effectiveness =  90% dpi  > try anoro > no change sob so d/c'd and no change off it   She denies really being limited from sob and doing as well off breo/anoro as on them so no need to rx copd at this point and note she's only  A GOLD II dx by virtue of reduced lung volumes likely related to body habitus  In this setting ok to use saba prn and f/u here if symptoms worsens or need for saba increases   Each maintenance medication was reviewed in detail including most importantly the difference between maintenance and as needed and under what circumstances the prns are to be used.  Please see AVS for specific  Instructions which are unique to this visit and I personally typed out  which were reviewed in detail in writing with the patient and a copy provided.

## 2017-12-22 NOTE — Progress Notes (Signed)
Subjective:     Patient ID: [name], female   DOB: Nov 30, 1940,     MRN: [id number]  HPI  46 yowf quit smoking 1994  With no resp limitations but around 2016 p episode of bad "bronchitis" noted doe and starting using saba but rarely needing but much worse since req admit   Admit date: 08/07/2017 Discharge date: 08/10/2017  Discharge Diagnoses:  Principal Problem:   Acute respiratory failure with hypoxia ([place]) Active Problems:   Influenza A   Anxiety   Depression   Hyperlipemia   Macular degeneration   Glaucoma   Weakness   Dehydration   Felt back to baseline / rare saba need   Admit date: 09/05/2017 Discharge date: 09/14/2017  Brief/Interim Summary: 77 year old female with history of anxiety, depression, GERD, arthritis, tobacco abuse and diverticulosis with multiple episodes of diverticular bleeding including most recently in April 2018 presented on 09/05/2017 with bright red blood per rectum and drop in her hemoglobin from 12.9 to 9.4. GI was consulted. Overnight from 09/05/2017-09/06/2017, she spiked a fever to 102.6 and became tachycardic. She was started on antibiotics for pneumonia.  Her bleeding is stopped and hemoglobin has remained stable.  GI has signed off.  She was treated with a course of intravenous antibiotics, cultures have remained negative.  She was initially requiring oxygen but currently is on room air.  Antibiotics were discontinued on 09/13/2017.  She is weak and deconditioned.  She will be discharged to nursing home once bed is available.   Discharge Diagnoses:  Active Problems:   Diverticulosis of intestine with bleeding  Acute hypoxic and hypercapnic respiratory failure due to RLL pneumonia -Resolved.  Currently on room air.  Patient initially required BiPAP which was weaned to nasal cannula oxygen.  I -She has received4 doses of Lasix so far, one each onthe last 4consecutive days.  No need for further Lasix.  Might try low-dose Lasix in the  nursing home if patient becomes tachypneic -Continue incentive spirometry.  Patient might benefit from outpatient repeat chest x-ray in a week.  Also might benefit from outpatient pulmonary evaluation. -Echo showed EF of 55-60% with grade 1 diastolic dysfunction  Multifocal pneumonia with bilateral pleural effusion probable continue Unasyn and azithromycin -Initially was on Unasyn and Zithromax which was switched to Zosyn and Zithromax on 09/10/2011. Of Zithromax since 09/11/2017. Cultures negative so far.  Antibiotics were discontinued yesterday on 09/13/2017 on day #8. - Continue albuterol -Urine streptococcal antigen negative.  Acute metabolic encephalopathy, likely due to hypercapnea.  -Improved.    Sepsis -Resolved.Cultures negative so far. Hemodynamically stable. -Antibiotic plan as above  Leukocytosis -Improved  Acute blood loss anemia secondary to diverticular bleeding -Hemoglobin10.8 today. Bleeding has stopped. GI has signed off. -Transfused1 unit PRBC on 09/08/17 -Outpatient follow-up with GI  Generalized deconditioning -Continue PT in the nursing home  Hyperlipidemia, Stable, continue simvastatin  Glaucoma with macular degeneration, stable, continued eyedrops  Hypertension -Blood Pressure stable. Monitor   Hypokalemia -Outpatient follow-up     D/c [facility] p 20 days p d/c improved doe but not back to baseline     11/18/2017 1st [facility] office visit/ [name]   GOLD II copd criteria  Chief Complaint  Patient presents with  . Pulmonary Consult    Referred by [facility] for eval of COPD. She states she was hospitalized in March 2019 with PNA and she was told she had COPD. She states today her breathing is overall doing well. She feels like she has some "congestion in  my bronchial tubes".  She gets winded walking approx 4 isles at the grocery store.  She has occ cough, mainly non prod. She uses ventolin approx 2 daily.   also limted by  knees/energy/ no longer able to finish shopping at [facility] s giving out Doe = MMRC3 = can't walk 100 yards even at a slow pace at a flat grade s stopping due to sob /fatigue  Cough minimum sputum , day not noct and sporadic - sev times a week/ not   every day   One pillow comfortable  No better p trial of breo while on inderal, timolol eyedrops rec  Anoro one click each am - take two good drags off of one click  - fill the prescription if you feel it helps        12/22/2017  f/u ov/[name] re: GOLD II/ no better anoro, no worse off it Chief Complaint  Patient presents with  . Follow-up    Breathing is about the same. She is coughing less. She uses her albuterol inhaler 3 x per wk on average.   Dyspnea:  MMRC2 = can't walk a nl pace on a flat grade s sob but does fine slow and flat    Walks to mb and back ok  Cough: none  SABA use: rare, usually when overdoes it  02: 2 lpm hs only   No obvious day to day or daytime variability or assoc excess/ purulent sputum or mucus plugs or hemoptysis or cp or chest tightness, subjective wheeze or overt sinus or hb symptoms.   Sleeping: ok on one pillow on side mostly on 2lpm  without nocturnal  or early am exacerbation  of respiratory  c/o's or need for noct saba. Also denies any obvious fluctuation of symptoms with weather or environmental changes or other aggravating or alleviating factors except as outlined above   No unusual exposure hx or h/o childhood pna/ asthma or knowledge of premature birth.  Current Allergies, Complete Past Medical History, Past Surgical History, Family History, and Social History were reviewed in [employer] record.  ROS  The following are not active complaints unless bolded Hoarseness, sore throat, dysphagia, dental problems, itching, sneezing,  nasal congestion or discharge of excess mucus or purulent secretions, ear ache,   fever, chills, sweats, unintended wt loss or wt gain, classically pleuritic or  exertional cp,  orthopnea pnd or arm/hand swelling  or leg swelling, presyncope, palpitations, abdominal pain, anorexia, nausea, vomiting, diarrhea  or change in bowel habits or change in bladder habits, change in stools or change in urine, dysuria, hematuria,  rash, arthralgias, visual complaints, headache, numbness, weakness or ataxia or problems with walking or coordination,  change in mood or  memory.        Current Meds  Medication Sig  . acetaminophen (TYLENOL) 500 MG tablet Take 1,000 mg by mouth daily as needed for headache.  . albuterol (PROVENTIL HFA;VENTOLIN HFA) 108 (90 Base) MCG/ACT inhaler Inhale 2 puffs into the lungs daily as needed. For seasonal allergies . Use 2 puffs 3 times daily x 4 days then back to home regimen.  Marland Kitchen ALPRAZolam (XANAX) 0.25 MG tablet Take 1 tablet (0.25 mg total) by mouth daily as needed for anxiety.  . cholecalciferol (VITAMIN D) 1000 UNITS tablet Take 2,000 Units by mouth 2 (two) times daily.   . dorzolamide-timolol (COSOPT) 22.3-6.8 MG/ML ophthalmic solution Place 1 drop into both eyes 2 (two) times daily.  Marland Kitchen guaiFENesin (MUCINEX) 600 MG 12 hr  tablet Take 600 mg by mouth 2 (two) times daily.  Marland Kitchen loratadine (CLARITIN) 10 MG tablet Take 1 tablet (10 mg total) by mouth daily.  . Multiple Vitamin (MULTIVITAMIN WITH MINERALS) TABS Take 1 tablet by mouth daily.  . Multiple Vitamins-Minerals (ICAPS AREDS 2) CAPS Take 1 capsule by mouth 2 (two) times daily.  Marland Kitchen omeprazole (PRILOSEC) 20 MG capsule Take 20 mg by mouth daily.   . OXYGEN 2lpm with sleep only  [employer]  . polyethylene glycol (MIRALAX / GLYCOLAX) packet Take 17 g by mouth daily.  . propranolol (INDERAL) 20 MG tablet Take 20 mg by mouth daily.  . sertraline (ZOLOFT) 100 MG tablet Take 100 mg by mouth daily.  . simvastatin (ZOCOR) 20 MG tablet Take 20 mg by mouth daily.                 Objective:   Physical Exam  amb wf nad   12/22/2017        187   11/18/17 183 lb 3.2 oz (83.1 kg)  09/14/17  172 lb 6.4 oz (78.2 kg)  10/16/16 187 lb (84.8 kg)      Vital signs reviewed - Note on arrival 02 sats  95% on RA         HEENT: nl dentition, turbinates bilaterally, and oropharynx. Nl external ear canals without cough reflex   NECK :  without JVD/Nodes/TM/ nl carotid upstrokes bilaterally   LUNGS: no acc muscle use,  Nl contour chest which is clear to A and P bilaterally without cough on insp or exp maneuvers   CV:  RRR  no s3 or murmur or increase in P2, and   tight elastic elastic hose/ ? Trace pitting both ankles    ABD:  soft and nontender with nl inspiratory excursion in the supine position. No bruits or organomegaly appreciated, bowel sounds nl  MS:  Nl gait/ ext warm without deformities, calf tenderness, cyanosis or clubbing No obvious joint restrictions   SKIN: warm and dry without lesions    NEURO:  alert, approp, nl sensorium with  no motor or cerebellar deficits apparent.            Assessment:

## 2017-12-22 NOTE — Patient Instructions (Addendum)
Only use your albuterol as a rescue medication to be used if you can't catch your breath by resting or doing a relaxed purse lip breathing pattern.  - The less you use it, the better it will work when you need it. - Ok to use up to 2 puffs  every 4 hours if you must but call for immediate appointment if use goes up over your usual need - Don't leave home without it !!  (think of it like the spare tire for your car)     If you are satisfied with your treatment plan,  let your doctor know and he/she can either refill your medications or you can return here when your prescription runs out.     If in any way you are not 100% satisfied,  please tell us.  If 100% better, tell your friends!  Pulmonary follow up is as needed   

## 2017-12-29 DIAGNOSIS — K5900 Constipation, unspecified: Secondary | ICD-10-CM | POA: Diagnosis not present

## 2017-12-29 DIAGNOSIS — D500 Iron deficiency anemia secondary to blood loss (chronic): Secondary | ICD-10-CM | POA: Diagnosis not present

## 2017-12-29 DIAGNOSIS — K5731 Diverticulosis of large intestine without perforation or abscess with bleeding: Secondary | ICD-10-CM | POA: Diagnosis not present

## 2018-01-05 DIAGNOSIS — H353131 Nonexudative age-related macular degeneration, bilateral, early dry stage: Secondary | ICD-10-CM | POA: Diagnosis not present

## 2018-01-05 DIAGNOSIS — H401133 Primary open-angle glaucoma, bilateral, severe stage: Secondary | ICD-10-CM | POA: Diagnosis not present

## 2018-01-05 DIAGNOSIS — H4720 Unspecified optic atrophy: Secondary | ICD-10-CM | POA: Diagnosis not present

## 2018-01-05 DIAGNOSIS — H35363 Drusen (degenerative) of macula, bilateral: Secondary | ICD-10-CM | POA: Diagnosis not present

## 2018-01-29 DIAGNOSIS — R82998 Other abnormal findings in urine: Secondary | ICD-10-CM | POA: Diagnosis not present

## 2018-01-29 DIAGNOSIS — E7849 Other hyperlipidemia: Secondary | ICD-10-CM | POA: Diagnosis not present

## 2018-01-29 DIAGNOSIS — R7301 Impaired fasting glucose: Secondary | ICD-10-CM | POA: Diagnosis not present

## 2018-01-29 DIAGNOSIS — E559 Vitamin D deficiency, unspecified: Secondary | ICD-10-CM | POA: Diagnosis not present

## 2018-02-05 DIAGNOSIS — Z0000 Encounter for general adult medical examination without abnormal findings: Secondary | ICD-10-CM | POA: Diagnosis not present

## 2018-02-05 DIAGNOSIS — I872 Venous insufficiency (chronic) (peripheral): Secondary | ICD-10-CM | POA: Diagnosis not present

## 2018-02-05 DIAGNOSIS — J449 Chronic obstructive pulmonary disease, unspecified: Secondary | ICD-10-CM | POA: Diagnosis not present

## 2018-02-05 DIAGNOSIS — M791 Myalgia, unspecified site: Secondary | ICD-10-CM | POA: Diagnosis not present

## 2018-02-05 DIAGNOSIS — R0609 Other forms of dyspnea: Secondary | ICD-10-CM | POA: Diagnosis not present

## 2018-02-05 DIAGNOSIS — J45998 Other asthma: Secondary | ICD-10-CM | POA: Diagnosis not present

## 2018-02-05 DIAGNOSIS — E7849 Other hyperlipidemia: Secondary | ICD-10-CM | POA: Diagnosis not present

## 2018-02-05 DIAGNOSIS — R0901 Asphyxia: Secondary | ICD-10-CM | POA: Diagnosis not present

## 2018-02-05 DIAGNOSIS — R69 Illness, unspecified: Secondary | ICD-10-CM | POA: Diagnosis not present

## 2018-02-05 DIAGNOSIS — M859 Disorder of bone density and structure, unspecified: Secondary | ICD-10-CM | POA: Diagnosis not present

## 2018-02-13 DIAGNOSIS — H401132 Primary open-angle glaucoma, bilateral, moderate stage: Secondary | ICD-10-CM | POA: Diagnosis not present

## 2018-02-13 DIAGNOSIS — Z1212 Encounter for screening for malignant neoplasm of rectum: Secondary | ICD-10-CM | POA: Diagnosis not present

## 2018-02-18 ENCOUNTER — Ambulatory Visit: Payer: Medicare HMO | Admitting: Podiatry

## 2018-02-18 ENCOUNTER — Encounter: Payer: Self-pay | Admitting: Podiatry

## 2018-02-18 VITALS — BP 129/70 | HR 74 | Resp 16

## 2018-02-18 DIAGNOSIS — I739 Peripheral vascular disease, unspecified: Secondary | ICD-10-CM | POA: Diagnosis not present

## 2018-02-18 DIAGNOSIS — L03031 Cellulitis of right toe: Secondary | ICD-10-CM

## 2018-02-18 NOTE — Patient Instructions (Signed)

## 2018-02-18 NOTE — Progress Notes (Signed)
   Subjective:    Patient ID: [name], female    DOB: 22-May-1941, 77 y.o.   MRN: [id number]  HPI    Review of Systems  All other systems reviewed and are negative.      Objective:   Physical Exam        Assessment & Plan:

## 2018-02-19 NOTE — Progress Notes (Signed)
Subjective:   Patient ID: [name], female   DOB: 77 y.o.   MRN: [id number]   HPI Patient states she is had pain in the right big toenail for 2 months duration with some slight drainage and inability to trim it herself or soak it and its been getting sore with pressure.  Patient does not smoke and likes to be active and does not give history one question of claudication-like symptoms   Review of Systems  All other systems reviewed and are negative.       Objective:  Physical Exam  Constitutional: She appears well-developed and well-nourished.  Cardiovascular: Intact distal pulses.  Pulmonary/Chest: Effort normal.  Musculoskeletal: Normal range of motion.  Neurological: She is alert.  Skin: Skin is warm.  Nursing note and vitals reviewed.   Upon evaluation I did note that pulses were weak PT DP bilateral with patient still having good digital perfusion.  The right hallux nail is incurvated in the corners and there is distal redness and slight drainage from the medial border that is localized with no proximal edema erythema or drainage noted.  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  Patient wants surgery and today I infiltrated the right hallux 60 mill grams like Marcaine mixture I removed the medial lateral borders removed proud flesh allow channel for drainage and applied sterile dressings with instructions on soaks.  Patient is encouraged to call with any questions concerns.  I also did ABIs which did indicate PAD but I do not recommend treating this as her symptoms right now are mild and she will simply  watch it for the future and if they get worse we will get her to vascular doctor for evaluation

## 2018-03-14 DIAGNOSIS — R69 Illness, unspecified: Secondary | ICD-10-CM | POA: Diagnosis not present

## 2018-03-27 DIAGNOSIS — H401132 Primary open-angle glaucoma, bilateral, moderate stage: Secondary | ICD-10-CM | POA: Diagnosis not present

## 2018-04-22 DIAGNOSIS — F4002 Agoraphobia without panic disorder: Secondary | ICD-10-CM | POA: Diagnosis not present

## 2018-04-22 DIAGNOSIS — R69 Illness, unspecified: Secondary | ICD-10-CM | POA: Diagnosis not present

## 2018-04-23 DIAGNOSIS — Z6834 Body mass index (BMI) 34.0-34.9, adult: Secondary | ICD-10-CM | POA: Diagnosis not present

## 2018-04-23 DIAGNOSIS — R0609 Other forms of dyspnea: Secondary | ICD-10-CM | POA: Diagnosis not present

## 2018-04-23 DIAGNOSIS — R0901 Asphyxia: Secondary | ICD-10-CM | POA: Diagnosis not present

## 2018-04-23 DIAGNOSIS — J449 Chronic obstructive pulmonary disease, unspecified: Secondary | ICD-10-CM | POA: Diagnosis not present

## 2018-05-06 DIAGNOSIS — R0901 Asphyxia: Secondary | ICD-10-CM | POA: Diagnosis not present

## 2018-05-06 DIAGNOSIS — J449 Chronic obstructive pulmonary disease, unspecified: Secondary | ICD-10-CM | POA: Diagnosis not present

## 2018-05-06 DIAGNOSIS — R0609 Other forms of dyspnea: Secondary | ICD-10-CM | POA: Diagnosis not present

## 2018-05-08 DIAGNOSIS — J449 Chronic obstructive pulmonary disease, unspecified: Secondary | ICD-10-CM | POA: Diagnosis not present

## 2018-06-05 DIAGNOSIS — R0609 Other forms of dyspnea: Secondary | ICD-10-CM | POA: Diagnosis not present

## 2018-06-05 DIAGNOSIS — R0901 Asphyxia: Secondary | ICD-10-CM | POA: Diagnosis not present

## 2018-06-05 DIAGNOSIS — J449 Chronic obstructive pulmonary disease, unspecified: Secondary | ICD-10-CM | POA: Diagnosis not present

## 2018-06-11 DIAGNOSIS — H401132 Primary open-angle glaucoma, bilateral, moderate stage: Secondary | ICD-10-CM | POA: Diagnosis not present

## 2018-07-06 DIAGNOSIS — J449 Chronic obstructive pulmonary disease, unspecified: Secondary | ICD-10-CM | POA: Diagnosis not present

## 2018-07-06 DIAGNOSIS — R0609 Other forms of dyspnea: Secondary | ICD-10-CM | POA: Diagnosis not present

## 2018-07-06 DIAGNOSIS — R0901 Asphyxia: Secondary | ICD-10-CM | POA: Diagnosis not present

## 2018-08-03 DIAGNOSIS — E559 Vitamin D deficiency, unspecified: Secondary | ICD-10-CM | POA: Diagnosis not present

## 2018-08-03 DIAGNOSIS — M859 Disorder of bone density and structure, unspecified: Secondary | ICD-10-CM | POA: Diagnosis not present

## 2018-08-06 DIAGNOSIS — R0901 Asphyxia: Secondary | ICD-10-CM | POA: Diagnosis not present

## 2018-08-06 DIAGNOSIS — R0609 Other forms of dyspnea: Secondary | ICD-10-CM | POA: Diagnosis not present

## 2018-08-06 DIAGNOSIS — J449 Chronic obstructive pulmonary disease, unspecified: Secondary | ICD-10-CM | POA: Diagnosis not present

## 2018-08-10 DIAGNOSIS — M859 Disorder of bone density and structure, unspecified: Secondary | ICD-10-CM | POA: Diagnosis not present

## 2018-08-10 DIAGNOSIS — R2689 Other abnormalities of gait and mobility: Secondary | ICD-10-CM | POA: Diagnosis not present

## 2018-08-10 DIAGNOSIS — J449 Chronic obstructive pulmonary disease, unspecified: Secondary | ICD-10-CM | POA: Diagnosis not present

## 2018-08-10 DIAGNOSIS — R69 Illness, unspecified: Secondary | ICD-10-CM | POA: Diagnosis not present

## 2018-08-10 DIAGNOSIS — Z6834 Body mass index (BMI) 34.0-34.9, adult: Secondary | ICD-10-CM | POA: Diagnosis not present

## 2018-08-10 DIAGNOSIS — R7301 Impaired fasting glucose: Secondary | ICD-10-CM | POA: Diagnosis not present

## 2018-08-17 ENCOUNTER — Other Ambulatory Visit: Payer: Self-pay | Admitting: Internal Medicine

## 2018-08-17 ENCOUNTER — Ambulatory Visit
Admission: RE | Admit: 2018-08-17 | Discharge: 2018-08-17 | Disposition: A | Discharge status: Home / Self Care | Payer: Medicare HMO | Source: Ambulatory Visit | Attending: Internal Medicine | Admitting: Internal Medicine

## 2018-08-17 DIAGNOSIS — W1830XA Fall on same level, unspecified, initial encounter: Secondary | ICD-10-CM | POA: Diagnosis not present

## 2018-08-17 DIAGNOSIS — M7918 Myalgia, other site: Secondary | ICD-10-CM

## 2018-08-17 DIAGNOSIS — S3993XA Unspecified injury of pelvis, initial encounter: Secondary | ICD-10-CM | POA: Diagnosis not present

## 2018-08-17 DIAGNOSIS — M545 Low back pain, unspecified: Secondary | ICD-10-CM

## 2018-08-17 DIAGNOSIS — K5900 Constipation, unspecified: Secondary | ICD-10-CM | POA: Diagnosis not present

## 2018-08-17 DIAGNOSIS — M4856XA Collapsed vertebra, not elsewhere classified, lumbar region, initial encounter for fracture: Secondary | ICD-10-CM | POA: Diagnosis not present

## 2018-08-17 DIAGNOSIS — R52 Pain, unspecified: Secondary | ICD-10-CM | POA: Diagnosis not present

## 2018-08-17 DIAGNOSIS — M810 Age-related osteoporosis without current pathological fracture: Secondary | ICD-10-CM | POA: Diagnosis not present

## 2018-08-19 DIAGNOSIS — M4856XA Collapsed vertebra, not elsewhere classified, lumbar region, initial encounter for fracture: Secondary | ICD-10-CM | POA: Diagnosis not present

## 2018-08-19 DIAGNOSIS — Z6833 Body mass index (BMI) 33.0-33.9, adult: Secondary | ICD-10-CM | POA: Diagnosis not present

## 2018-08-19 DIAGNOSIS — M810 Age-related osteoporosis without current pathological fracture: Secondary | ICD-10-CM | POA: Diagnosis not present

## 2018-08-20 ENCOUNTER — Other Ambulatory Visit: Payer: Self-pay | Admitting: Internal Medicine

## 2018-08-20 DIAGNOSIS — M4856XA Collapsed vertebra, not elsewhere classified, lumbar region, initial encounter for fracture: Secondary | ICD-10-CM

## 2018-08-26 ENCOUNTER — Emergency Department (HOSPITAL_COMMUNITY): Payer: Medicare HMO

## 2018-08-26 ENCOUNTER — Emergency Department (HOSPITAL_COMMUNITY)
Admission: EM | Admit: 2018-08-26 | Discharge: 2018-08-26 | Disposition: A | Discharge status: Home / Self Care | Payer: Medicare HMO | Attending: Emergency Medicine | Admitting: Emergency Medicine

## 2018-08-26 ENCOUNTER — Encounter (HOSPITAL_COMMUNITY): Payer: Self-pay

## 2018-08-26 DIAGNOSIS — Y939 Activity, unspecified: Secondary | ICD-10-CM | POA: Insufficient documentation

## 2018-08-26 DIAGNOSIS — S32001A Stable burst fracture of unspecified lumbar vertebra, initial encounter for closed fracture: Secondary | ICD-10-CM | POA: Insufficient documentation

## 2018-08-26 DIAGNOSIS — Y999 Unspecified external cause status: Secondary | ICD-10-CM | POA: Diagnosis not present

## 2018-08-26 DIAGNOSIS — I10 Essential (primary) hypertension: Secondary | ICD-10-CM | POA: Diagnosis not present

## 2018-08-26 DIAGNOSIS — S32011A Stable burst fracture of first lumbar vertebra, initial encounter for closed fracture: Secondary | ICD-10-CM | POA: Diagnosis not present

## 2018-08-26 DIAGNOSIS — Y929 Unspecified place or not applicable: Secondary | ICD-10-CM | POA: Insufficient documentation

## 2018-08-26 DIAGNOSIS — Z79899 Other long term (current) drug therapy: Secondary | ICD-10-CM | POA: Diagnosis not present

## 2018-08-26 DIAGNOSIS — Z87891 Personal history of nicotine dependence: Secondary | ICD-10-CM | POA: Diagnosis not present

## 2018-08-26 DIAGNOSIS — I959 Hypotension, unspecified: Secondary | ICD-10-CM | POA: Diagnosis not present

## 2018-08-26 DIAGNOSIS — W19XXXA Unspecified fall, initial encounter: Secondary | ICD-10-CM | POA: Insufficient documentation

## 2018-08-26 DIAGNOSIS — M5489 Other dorsalgia: Secondary | ICD-10-CM | POA: Diagnosis not present

## 2018-08-26 DIAGNOSIS — R52 Pain, unspecified: Secondary | ICD-10-CM | POA: Diagnosis not present

## 2018-08-26 LAB — I-STAT CREATININE, ED: CREATININE: 0.4 mg/dL — AB (ref 0.44–1.00)

## 2018-08-26 MED ORDER — OXYCODONE HCL 5 MG PO TABS
2.5000 mg | ORAL_TABLET | Freq: Once | ORAL | Status: AC
Start: 2018-08-26 — End: 2018-08-26
  Administered 2018-08-26: 2.5 mg via ORAL
  Filled 2018-08-26: qty 1

## 2018-08-26 MED ORDER — DICLOFENAC SODIUM 1 % TD GEL: 4.0000 g | Freq: Four times a day (QID) | TRANSDERMAL | 0 refills | Status: DC

## 2018-08-26 MED ORDER — DICLOFENAC SODIUM 1 % TD GEL
4.0000 g | Freq: Once | TRANSDERMAL | Status: AC
  Administered 2018-08-26: 20:00:00 4 g via TOPICAL
  Filled 2018-08-26: qty 100

## 2018-08-26 MED ORDER — GADOBUTROL 1 MMOL/ML IV SOLN
7.0000 mL | Freq: Once | INTRAVENOUS | Status: AC | PRN
  Administered 2018-08-26: 7 mL via INTRAVENOUS

## 2018-08-26 MED ORDER — ACETAMINOPHEN 500 MG PO TABS
1000.0000 mg | ORAL_TABLET | Freq: Once | ORAL | Status: AC
  Administered 2018-08-26: 1000 mg via ORAL
  Filled 2018-08-26: qty 2

## 2018-08-26 NOTE — Discharge Instructions (Signed)
Follow-up with your family doctor.  They may need to refer you to a spinal surgeon.  Return to the emergency department for numbness or weakness to one of your legs difficulty urinating or if you poop on yourself.

## 2018-08-26 NOTE — ED Notes (Signed)
Pt offered pain medicine, pt denies need at this time. Notified it is available.

## 2018-08-26 NOTE — ED Triage Notes (Signed)
Pt arrives via [facility] from home, pt had a fall 2 weeks ago, pt was seen by PCP and sent for imaging which confirmed L1 compression fx. Pt on 5-325 hydrocodone at home. PCP unable to get pt in for MRI, pt sent here due to needing MRI and pain control. Pt had last dose of home pain med at 1230. Pain currently 5/10. Pt ambulatory with walker.

## 2018-08-26 NOTE — ED Provider Notes (Signed)
[facility] EMERGENCY DEPARTMENT Provider Note   CSN: [id number] Arrival date & time: 08/26/18  1517    History   Chief Complaint Chief Complaint  Patient presents with  . Back Pain    HPI [name] is a 78 y.o. female.     78 yo F with a chief complaint of low back pain.  The patient suffered a fall about 2 or 3 weeks ago and since then has had persistent pain.  She has had a work-up done so far by her PCP that included a CT scan that was concerning for an L1 fracture.  Patient is continuing to have pain and so he sent her here for MRI.  Patient denies loss of bowel or bladder denies loss perirectal sensation denies weakness or numbness to the legs.  She has been bedridden for the past couple weeks and every time she gets up to move around has worsening of her pain.  She describes it as sharp pain.  Improves with positioning.  She denies abdominal pain.  The history is provided by the patient.  Back Pain  Location:  Generalized Quality:  Aching Radiates to:  Does not radiate Pain severity:  Moderate Pain is:  Worse during the day Onset quality:  Sudden Duration:  2 days Timing:  Constant Progression:  Worsening Chronicity:  New Relieved by:  Nothing Worsened by:  Nothing Ineffective treatments:  None tried Associated symptoms: no chest pain, no dysuria, no fever and no headaches     Past Medical History:  Diagnosis Date  . Anxiety   . Arthritis    "bilateral knees, shoulders, elbows; neck, pretty widespread" (09/05/2017)  . BPPV (benign paroxysmal positional vertigo)   . Depression   . GERD (gastroesophageal reflux disease)   . Glaucoma, both eyes   . Headache    "probably 2/month" (09/05/2017)  . History of blood transfusion ~ 2008   "related to LGIB"  . Hyperlipemia   . Lower GI bleeding ~ 2008; 09/05/2017   "had to have blood transfusion"  . Macular degeneration, bilateral   . Osteopenia   . Seasonal allergies   . Skin cancer, basal cell  2001   "off my nose, left side"  . Sleeping excessive   . Tinnitus of both ears     Patient Active Problem List   Diagnosis Date Noted  . DOE (dyspnea on exertion) 11/18/2017  . COPD GOLD II  11/18/2017  . Diverticulosis of intestine with bleeding 09/05/2017  . Influenza A 08/08/2017  . Acute respiratory failure with hypoxia ([facility]) 08/07/2017  . Depression 08/07/2017  . Hyperlipemia 08/07/2017  . Macular degeneration 08/07/2017  . Glaucoma 08/07/2017  . Weakness 08/07/2017  . Dehydration 08/07/2017  . Lower GI bleeding 10/14/2016  . Diverticulosis of colon 10/14/2016  . Anxiety 10/14/2016  . Lower GI bleed 10/14/2016  . Acute GI bleeding   . 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 Dermatochalasis of both upper eyelids 08/17/2014  . Osteoarthrosis, unspecified whether generalized or localized, lower leg 12/20/2011    Past Surgical History:  Procedure Laterality Date  . BASAL CELL CARCINOMA EXCISION  2001   "off my nose, left side"  . BLEPHAROPLASTY Bilateral   . CATARACT EXTRACTION W/ INTRAOCULAR LENS  IMPLANT, BILATERAL Bilateral 1990's  . EYE SURGERY Bilateral    "to improve vision after cataract OR"  . JOINT REPLACEMENT    . STAPEDES SURGERY Left    "scraped stapedes because it was sticking when  it wasn't suppose to"  . TONSILLECTOMY AND ADENOIDECTOMY  1946  . TOTAL KNEE ARTHROPLASTY Left ~ 2008  . TOTAL KNEE ARTHROPLASTY  12/20/2011   Procedure: TOTAL KNEE ARTHROPLASTY;  Surgeon: Augustin Schooling, MD;  Location: [facility];  Service: Orthopedics;  Laterality: Right;  Right Total Knee Arthroplasty  . TUBAL LIGATION  1980's     OB History   No obstetric history on file.      Home Medications    Prior to Admission medications   Medication Sig Start Date End Date Taking? Authorizing Provider  acetaminophen (TYLENOL) 500 MG tablet Take 1,000 mg by mouth daily as needed for headache.    [provider]  albuterol (PROVENTIL HFA;VENTOLIN HFA) 108  (90 Base) MCG/ACT inhaler Inhale 2 puffs into the lungs daily as needed. For seasonal allergies . Use 2 puffs 3 times daily x 4 days then back to home regimen. 08/10/17   Eugenie Filler, MD  ALPRAZolam Duanne Moron) 0.25 MG tablet Take 1 tablet (0.25 mg total) by mouth daily as needed for anxiety. 09/14/17   Aline August, MD  cholecalciferol (VITAMIN D) 1000 UNITS tablet Take 2,000 Units by mouth 2 (two) times daily.     [provider]  diclofenac sodium (VOLTAREN) 1 % GEL Apply 4 g topically 4 (four) times daily. 08/26/18   Deno Etienne, DO  dorzolamide-timolol (COSOPT) 22.3-6.8 MG/ML ophthalmic solution Place 1 drop into both eyes 2 (two) times daily.    [provider]  DULoxetine (CYMBALTA) 30 MG capsule  02/18/18   [provider]  guaiFENesin (MUCINEX) 600 MG 12 hr tablet Take 600 mg by mouth 2 (two) times daily.    [provider]  latanoprost (XALATAN) 0.005 % ophthalmic solution  02/13/18   [provider]  loratadine (CLARITIN) 10 MG tablet Take 1 tablet (10 mg total) by mouth daily. 08/11/17   Eugenie Filler, MD  Multiple Vitamin (MULTIVITAMIN WITH MINERALS) TABS Take 1 tablet by mouth daily.    [provider]  Multiple Vitamins-Minerals (ICAPS AREDS 2) CAPS Take 1 capsule by mouth 2 (two) times daily.    [provider]  nystatin (MYCOSTATIN/NYSTOP) powder  02/17/18   [provider]  omeprazole (PRILOSEC) 20 MG capsule Take 20 mg by mouth daily.     [provider]  OXYGEN 2lpm with sleep only  [employer]    [provider]  polyethylene glycol (MIRALAX / GLYCOLAX) packet Take 17 g by mouth daily.    [provider]  propranolol (INDERAL) 20 MG tablet Take 20 mg by mouth daily.    [provider]  sertraline (ZOLOFT) 100 MG tablet Take 100 mg by mouth daily.    [provider]  simvastatin (ZOCOR) 20 MG tablet Take 20 mg by mouth daily.    [provider]     Family History Family History  Problem Relation Age of Onset  . Heart attack Mother   . Heart disease Father   . Heart failure Father   . Bladder Cancer Father   . Supraventricular tachycardia Sister   . Hypertension Sister     Social History Social History   Tobacco Use  . Smoking status: Former Smoker    Packs/day: 0.75    Years: 37.00    Pack years: 27.75    Last attempt to quit: 07/01/1992    Years since quitting: 26.1  . Smokeless tobacco: Never Used  Substance Use Topics  . Alcohol use: Yes  Alcohol/week: 7.0 standard drinks    Types: 7 Glasses of wine per week    Comment: 09/05/2017  "6 oz wine, 5 days/wk"  . Drug use: No    Types: Hydrocodone     Allergies   Morphine and related and Cefadroxil   Review of Systems Review of Systems  Constitutional: Negative for chills and fever.  HENT: Negative for congestion and rhinorrhea.   Eyes: Negative for redness and visual disturbance.  Respiratory: Negative for shortness of breath and wheezing.   Cardiovascular: Negative for chest pain and palpitations.  Gastrointestinal: Negative for nausea and vomiting.  Genitourinary: Negative for dysuria and urgency.  Musculoskeletal: Positive for back pain. Negative for arthralgias and myalgias.  Skin: Negative for pallor and wound.  Neurological: Negative for dizziness and headaches.     Physical Exam Updated Vital Signs BP (!) 154/80   Pulse 76   Temp 98.5 F (36.9 C) (Oral)   Resp 16   Ht 5\' 2"  (1.575 m)   Wt 78.1 kg   SpO2 96%   BMI 31.48 kg/m   Physical Exam Vitals signs and nursing note reviewed.  Constitutional:      General: She is not in acute distress.    Appearance: She is well-developed. She is not diaphoretic.  HENT:     Head: Normocephalic and atraumatic.  Eyes:     Pupils: Pupils are equal, round, and reactive to light.  Neck:     Musculoskeletal: Normal range of motion and neck supple.  Cardiovascular:     Rate and Rhythm: Normal rate  and regular rhythm.     Heart sounds: No murmur. No friction rub. No gallop.   Pulmonary:     Effort: Pulmonary effort is normal.     Breath sounds: No wheezing or rales.  Abdominal:     General: There is no distension.     Palpations: Abdomen is soft.     Tenderness: There is no abdominal tenderness.  Musculoskeletal:        General: No tenderness.     Comments: No appreciable midline spinal tenderness.  No signs of trauma to the low back.  Peritoneal sensation is intact to light touch.  No clonus reflexes are 2+ and equal bilaterally negative straight leg raise test bilaterally.  Skin:    General: Skin is warm and dry.  Neurological:     Mental Status: She is alert and oriented to person, place, and time.  Psychiatric:        Behavior: Behavior normal.      ED Treatments / Results  Labs (all labs ordered are listed, but only abnormal results are displayed) Labs Reviewed  I-STAT CREATININE, ED - Abnormal; Notable for the following components:      Result Value   Creatinine, Ser 0.40 (*)    All other components within normal limits    EKG None  Radiology Mr Lumbar Spine W Wo Contrast  Result Date: 08/26/2018 CLINICAL DATA:  Initial evaluation for acute fracture, recent fall 2 weeks ago. EXAM: MRI LUMBAR SPINE WITHOUT AND WITH CONTRAST TECHNIQUE: Multiplanar and multiecho pulse sequences of the lumbar spine were obtained without and with intravenous contrast. CONTRAST:  7 cc of Gadavist. COMPARISON:  None available. FINDINGS: Segmentation: Standard. Lowest well-formed disc labeled the L5-S1 level. Alignment: Trace anterolisthesis of L4 on L5 and L5 on S1. Alignment otherwise normal with preservation of the normal lumbar lordosis. Vertebrae: Acute burst type compression fracture seen involving the L1 vertebral body. Associated height loss  of up to 60% with 5 mm bony retropulsion. Associated reactive marrow edema and enhancement. No fracture extension through the posterior  elements. Fracture is benign/mechanical in appearance, with no appreciable underlying lesion. Vertebral body height otherwise maintained without evidence for acute or chronic fracture. Bone marrow signal intensity within normal limits. 12 mm benign hemangioma noted within the T12 vertebral body. No other discrete or worrisome osseous lesions. Mild reactive edema about the right L4-5 facet due to facet arthritis. No other abnormal marrow edema or enhancement. Conus medullaris and cauda equina: Conus extends to the L1 level. Conus and cauda equina appear normal. Paraspinal and other soft tissues: Mild soft tissue edema and enhancement adjacent to the acute L1 compression fracture. Additional edema and enhancement within the right posterior paraspinous soft tissues adjacent to the right L4-5 facet, likely reactive in nature. No other acute soft tissue abnormality. Scattered renal cysts noted bilaterally, largest of which seen on the left and measures 4.1 cm. Visualized paraspinous soft tissues otherwise unremarkable. Disc levels: T12-L1: 5 mm bony retropulsion related to the acute L1 compression fracture. Retropulsed bone flattens and partially effaces the ventral thecal sac. Resultant mild spinal stenosis without impingement of the distal conus. Foramina remain patent L1-2: Negative interspace.  Mild facet hypertrophy.  No stenosis. L2-3: Mild diffuse disc bulge with disc desiccation and intervertebral disc space narrowing. Mild to moderate facet and ligament flavum hypertrophy. Resultant mild canal with left lateral recess narrowing. No significant foraminal stenosis. L3-4: Mild diffuse disc bulge with disc desiccation and intervertebral disc space narrowing. Moderate facet and ligament flavum hypertrophy. Resultant mild bilateral lateral recess stenosis. Central canal remains patent. No significant foraminal encroachment. L4-5: Trace anterolisthesis. Mild diffuse disc bulge with disc desiccation. Advanced facet and  ligament flavum hypertrophy, with associated reactive edema on the right. Resultant mild bilateral lateral recess narrowing. Central canal remains patent. Mild bilateral L4 foraminal stenosis without impingement. L5-S1: Negative interspace. Moderate bilateral facet hypertrophy. No stenosis or impingement. IMPRESSION: 1. Acute burst type compression fracture involving the L1 vertebral body with up to 60% height loss and 5 mm bony retropulsion. Resultant mild spinal stenosis without cord impingement. 2. Mild multilevel degenerative disc bulging at L2-3 through L4-5 mild canal and bilateral lateral recess narrowing as above. No impingement. 3. Moderate to advanced multilevel facet hypertrophy throughout the lumbar spine, most notable at L4-5 on the right. Electronically Signed   By: Jeannine Boga M.D.   On: 08/26/2018 19:57    Procedures Procedures (including critical care time)  Medications Ordered in ED Medications  acetaminophen (TYLENOL) tablet 1,000 mg (1,000 mg Oral Given 08/26/18 1817)  diclofenac sodium (VOLTAREN) 1 % transdermal gel 4 g (4 g Topical Given 08/26/18 1945)  oxyCODONE (Oxy IR/ROXICODONE) immediate release tablet 2.5 mg (2.5 mg Oral Given 08/26/18 1817)  gadobutrol (GADAVIST) 1 MMOL/ML injection 7 mL (7 mLs Intravenous Contrast Given 08/26/18 1928)     Initial Impression / Assessment and Plan / ED Course  I have reviewed the triage vital signs and the nursing notes.  Pertinent labs & imaging results that were available during my care of the patient were reviewed by me and considered in my medical decision making (see chart for details).        78 yo F with a chief complaint of low back pain.  Patient has a known L1 fracture after a fall about 2 or 3 weeks ago.  She was sent here by her PCP for MRI.  She has no cauda equina signs on  history.  Exam also non-concerning.  The PCP had ordered a MRI with and without contrast, will recheck her creatinine here.  The patient's  renal function is normal, her MRI shows a burst fracture of L1 with no spinal cord involvement.  Patient's pain is not significantly improved post Tylenol diclofenac gel and an oxycodone tablet.  I will prescribe diclofenac gel for her.  Have her call her doctor in the morning.  I offered to have the rep bring her a lumbar corset to try.  Family state they will discuss this with the family physician.  9:12 PM:  I have discussed the diagnosis/risks/treatment options with the patient and family and believe the pt to be eligible for discharge home to follow-up with PCP. We also discussed returning to the ED immediately if new or worsening sx occur. We discussed the sx which are most concerning (e.g., sudden worsening pain, fever, inability to tolerate by mouth) that necessitate immediate return. Medications administered to the patient during their visit and any new prescriptions provided to the patient are listed below.  Medications given during this visit Medications  acetaminophen (TYLENOL) tablet 1,000 mg (1,000 mg Oral Given 08/26/18 1817)  diclofenac sodium (VOLTAREN) 1 % transdermal gel 4 g (4 g Topical Given 08/26/18 1945)  oxyCODONE (Oxy IR/ROXICODONE) immediate release tablet 2.5 mg (2.5 mg Oral Given 08/26/18 1817)  gadobutrol (GADAVIST) 1 MMOL/ML injection 7 mL (7 mLs Intravenous Contrast Given 08/26/18 1928)     The patient appears reasonably screen and/or stabilized for discharge and I doubt any other medical condition or other [facility] requiring further screening, evaluation, or treatment in the ED at this time prior to discharge.    Final Clinical Impressions(s) / ED Diagnoses   Final diagnoses:  Closed burst fracture of lumbar vertebra, initial encounter [facility])    ED Discharge Orders         Ordered    diclofenac sodium (VOLTAREN) 1 % GEL  4 times daily     08/26/18 2111           Deno Etienne, DO 08/26/18 2112

## 2018-08-26 NOTE — ED Notes (Signed)
Patient transported to MRI 

## 2018-08-26 NOTE — ED Notes (Signed)
Patient verbalizes understanding of discharge instructions. Opportunity for questioning and answers were provided. Armband removed by staff, pt discharged from ED. Pt assisted to wheel chair and out to car. Family to take pt home.

## 2018-09-04 DIAGNOSIS — R0609 Other forms of dyspnea: Secondary | ICD-10-CM | POA: Diagnosis not present

## 2018-09-04 DIAGNOSIS — R0901 Asphyxia: Secondary | ICD-10-CM | POA: Diagnosis not present

## 2018-09-04 DIAGNOSIS — J449 Chronic obstructive pulmonary disease, unspecified: Secondary | ICD-10-CM | POA: Diagnosis not present

## 2018-09-09 DIAGNOSIS — M545 Low back pain: Secondary | ICD-10-CM | POA: Diagnosis not present

## 2018-09-09 DIAGNOSIS — S32001A Stable burst fracture of unspecified lumbar vertebra, initial encounter for closed fracture: Secondary | ICD-10-CM | POA: Diagnosis not present

## 2018-09-09 DIAGNOSIS — S32012A Unstable burst fracture of first lumbar vertebra, initial encounter for closed fracture: Secondary | ICD-10-CM | POA: Diagnosis not present

## 2018-09-30 DIAGNOSIS — R300 Dysuria: Secondary | ICD-10-CM | POA: Diagnosis not present

## 2018-09-30 DIAGNOSIS — N390 Urinary tract infection, site not specified: Secondary | ICD-10-CM | POA: Diagnosis not present

## 2018-09-30 DIAGNOSIS — R82998 Other abnormal findings in urine: Secondary | ICD-10-CM | POA: Diagnosis not present

## 2018-10-05 DIAGNOSIS — R0609 Other forms of dyspnea: Secondary | ICD-10-CM | POA: Diagnosis not present

## 2018-10-05 DIAGNOSIS — R0901 Asphyxia: Secondary | ICD-10-CM | POA: Diagnosis not present

## 2018-10-05 DIAGNOSIS — N390 Urinary tract infection, site not specified: Secondary | ICD-10-CM | POA: Diagnosis not present

## 2018-10-05 DIAGNOSIS — R197 Diarrhea, unspecified: Secondary | ICD-10-CM | POA: Diagnosis not present

## 2018-10-05 DIAGNOSIS — J449 Chronic obstructive pulmonary disease, unspecified: Secondary | ICD-10-CM | POA: Diagnosis not present

## 2018-10-05 DIAGNOSIS — R112 Nausea with vomiting, unspecified: Secondary | ICD-10-CM | POA: Diagnosis not present

## 2018-10-22 DIAGNOSIS — N390 Urinary tract infection, site not specified: Secondary | ICD-10-CM | POA: Diagnosis not present

## 2018-10-23 DIAGNOSIS — N390 Urinary tract infection, site not specified: Secondary | ICD-10-CM | POA: Diagnosis not present

## 2018-11-04 DIAGNOSIS — R0609 Other forms of dyspnea: Secondary | ICD-10-CM | POA: Diagnosis not present

## 2018-11-04 DIAGNOSIS — R0901 Asphyxia: Secondary | ICD-10-CM | POA: Diagnosis not present

## 2018-11-04 DIAGNOSIS — J449 Chronic obstructive pulmonary disease, unspecified: Secondary | ICD-10-CM | POA: Diagnosis not present

## 2018-11-11 DIAGNOSIS — S32001A Stable burst fracture of unspecified lumbar vertebra, initial encounter for closed fracture: Secondary | ICD-10-CM | POA: Diagnosis not present

## 2018-11-19 DIAGNOSIS — Z1231 Encounter for screening mammogram for malignant neoplasm of breast: Secondary | ICD-10-CM | POA: Diagnosis not present

## 2018-12-02 DIAGNOSIS — N390 Urinary tract infection, site not specified: Secondary | ICD-10-CM | POA: Diagnosis not present

## 2018-12-05 DIAGNOSIS — R0609 Other forms of dyspnea: Secondary | ICD-10-CM | POA: Diagnosis not present

## 2018-12-05 DIAGNOSIS — J449 Chronic obstructive pulmonary disease, unspecified: Secondary | ICD-10-CM | POA: Diagnosis not present

## 2018-12-05 DIAGNOSIS — R0901 Asphyxia: Secondary | ICD-10-CM | POA: Diagnosis not present

## 2018-12-10 DIAGNOSIS — H401132 Primary open-angle glaucoma, bilateral, moderate stage: Secondary | ICD-10-CM | POA: Diagnosis not present

## 2019-01-04 DIAGNOSIS — J449 Chronic obstructive pulmonary disease, unspecified: Secondary | ICD-10-CM | POA: Diagnosis not present

## 2019-01-04 DIAGNOSIS — R0901 Asphyxia: Secondary | ICD-10-CM | POA: Diagnosis not present

## 2019-01-04 DIAGNOSIS — R0609 Other forms of dyspnea: Secondary | ICD-10-CM | POA: Diagnosis not present

## 2019-01-14 DIAGNOSIS — S32001A Stable burst fracture of unspecified lumbar vertebra, initial encounter for closed fracture: Secondary | ICD-10-CM | POA: Diagnosis not present

## 2019-01-14 DIAGNOSIS — R69 Illness, unspecified: Secondary | ICD-10-CM | POA: Diagnosis not present

## 2019-01-21 DIAGNOSIS — H4720 Unspecified optic atrophy: Secondary | ICD-10-CM | POA: Diagnosis not present

## 2019-01-21 DIAGNOSIS — H35363 Drusen (degenerative) of macula, bilateral: Secondary | ICD-10-CM | POA: Diagnosis not present

## 2019-01-21 DIAGNOSIS — H353131 Nonexudative age-related macular degeneration, bilateral, early dry stage: Secondary | ICD-10-CM | POA: Diagnosis not present

## 2019-01-21 DIAGNOSIS — H401133 Primary open-angle glaucoma, bilateral, severe stage: Secondary | ICD-10-CM | POA: Diagnosis not present

## 2019-02-04 DIAGNOSIS — R0609 Other forms of dyspnea: Secondary | ICD-10-CM | POA: Diagnosis not present

## 2019-02-04 DIAGNOSIS — J449 Chronic obstructive pulmonary disease, unspecified: Secondary | ICD-10-CM | POA: Diagnosis not present

## 2019-02-04 DIAGNOSIS — R0901 Asphyxia: Secondary | ICD-10-CM | POA: Diagnosis not present

## 2019-02-18 DIAGNOSIS — R69 Illness, unspecified: Secondary | ICD-10-CM | POA: Diagnosis not present

## 2019-02-25 DIAGNOSIS — R69 Illness, unspecified: Secondary | ICD-10-CM | POA: Diagnosis not present

## 2019-03-05 DIAGNOSIS — E7849 Other hyperlipidemia: Secondary | ICD-10-CM | POA: Diagnosis not present

## 2019-03-05 DIAGNOSIS — E559 Vitamin D deficiency, unspecified: Secondary | ICD-10-CM | POA: Diagnosis not present

## 2019-03-05 DIAGNOSIS — Z23 Encounter for immunization: Secondary | ICD-10-CM | POA: Diagnosis not present

## 2019-03-05 DIAGNOSIS — Z79899 Other long term (current) drug therapy: Secondary | ICD-10-CM | POA: Diagnosis not present

## 2019-03-07 DIAGNOSIS — R0609 Other forms of dyspnea: Secondary | ICD-10-CM | POA: Diagnosis not present

## 2019-03-07 DIAGNOSIS — R0901 Asphyxia: Secondary | ICD-10-CM | POA: Diagnosis not present

## 2019-03-07 DIAGNOSIS — J449 Chronic obstructive pulmonary disease, unspecified: Secondary | ICD-10-CM | POA: Diagnosis not present

## 2019-03-09 DIAGNOSIS — R82998 Other abnormal findings in urine: Secondary | ICD-10-CM | POA: Diagnosis not present

## 2019-03-12 DIAGNOSIS — J449 Chronic obstructive pulmonary disease, unspecified: Secondary | ICD-10-CM | POA: Diagnosis not present

## 2019-03-12 DIAGNOSIS — R69 Illness, unspecified: Secondary | ICD-10-CM | POA: Diagnosis not present

## 2019-03-12 DIAGNOSIS — E7849 Other hyperlipidemia: Secondary | ICD-10-CM | POA: Diagnosis not present

## 2019-03-12 DIAGNOSIS — M810 Age-related osteoporosis without current pathological fracture: Secondary | ICD-10-CM | POA: Diagnosis not present

## 2019-03-12 DIAGNOSIS — Z0000 Encounter for general adult medical examination without abnormal findings: Secondary | ICD-10-CM | POA: Diagnosis not present

## 2019-03-12 DIAGNOSIS — K5900 Constipation, unspecified: Secondary | ICD-10-CM | POA: Diagnosis not present

## 2019-03-12 DIAGNOSIS — M4856XA Collapsed vertebra, not elsewhere classified, lumbar region, initial encounter for fracture: Secondary | ICD-10-CM | POA: Diagnosis not present

## 2019-03-12 DIAGNOSIS — M545 Low back pain: Secondary | ICD-10-CM | POA: Diagnosis not present

## 2019-03-12 DIAGNOSIS — Z1339 Encounter for screening examination for other mental health and behavioral disorders: Secondary | ICD-10-CM | POA: Diagnosis not present

## 2019-03-17 DIAGNOSIS — Z1212 Encounter for screening for malignant neoplasm of rectum: Secondary | ICD-10-CM | POA: Diagnosis not present

## 2019-04-01 IMAGING — CT CT CERVICAL SPINE W/O CM
4 of 7 series · 12 of 33 positions shown, 13 images · non-contrast
Comparison: 08/07/2017

CLINICAL DATA: Patient fell onto concrete at 0644 hours this
evening. Patient struck the front of her head on the concrete floor
without loss of consciousness.

EXAM:
CT HEAD WITHOUT CONTRAST
CT CERVICAL SPINE WITHOUT CONTRAST
TECHNIQUE: Multidetector CT imaging of the head and cervical spine was
performed following the standard protocol without intravenous
contrast. Multiplanar CT image reconstructions of the cervical spine
were also generated.

[Series 8: c spine soft · axial · 0.31mm/px · z∈[-218,-150]mm · 3 of 85 slices shown]
[im 17/85  soft-tissue]
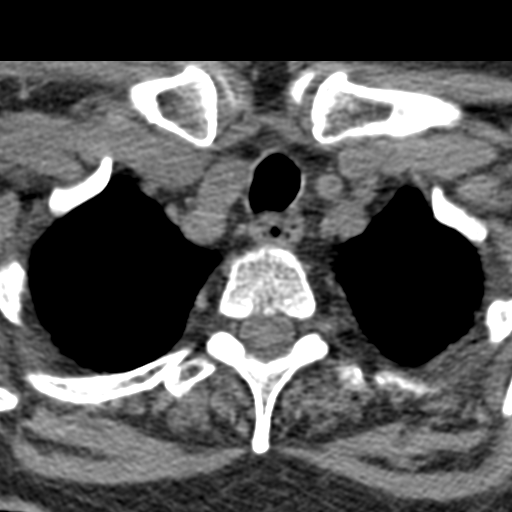
[im 34/85  soft-tissue]
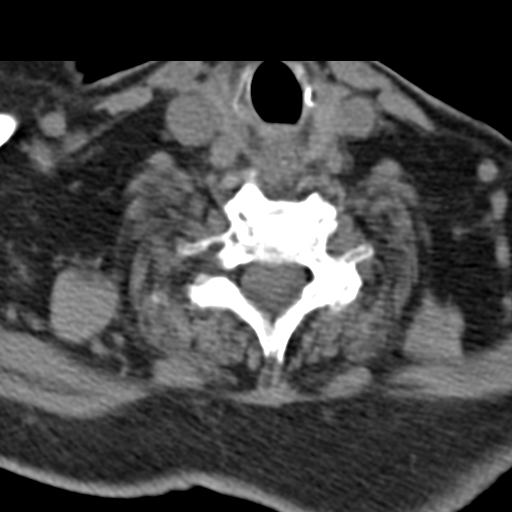
[im 51/85  soft-tissue]
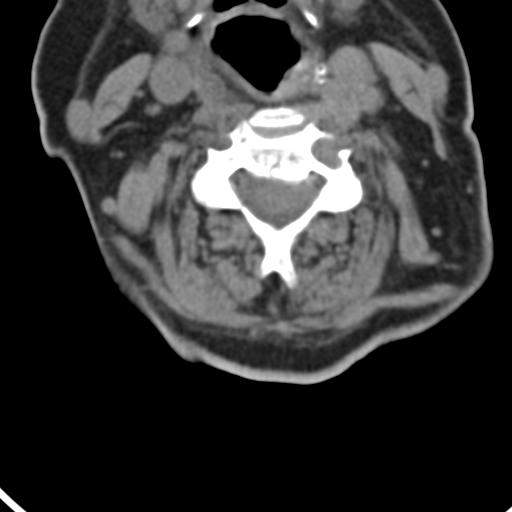

[Series 9: orthogonal bone · axial · 0.19mm/px · z∈[-228,-130]mm · 4 of 91 slices shown, 5 images]
[im 19/91  soft-tissue]
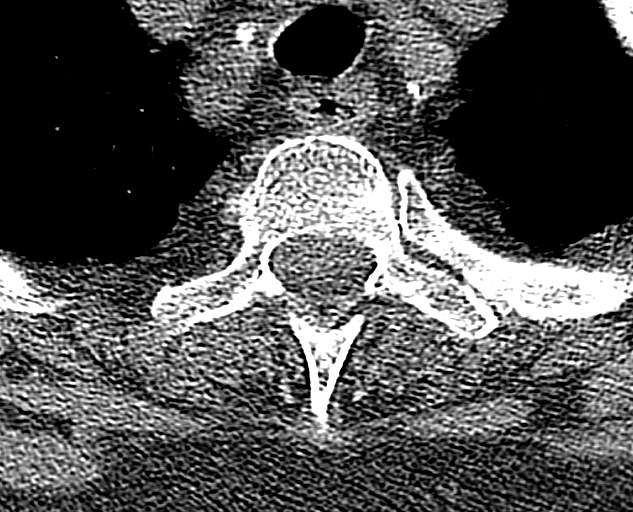
[im 19/91  bone]
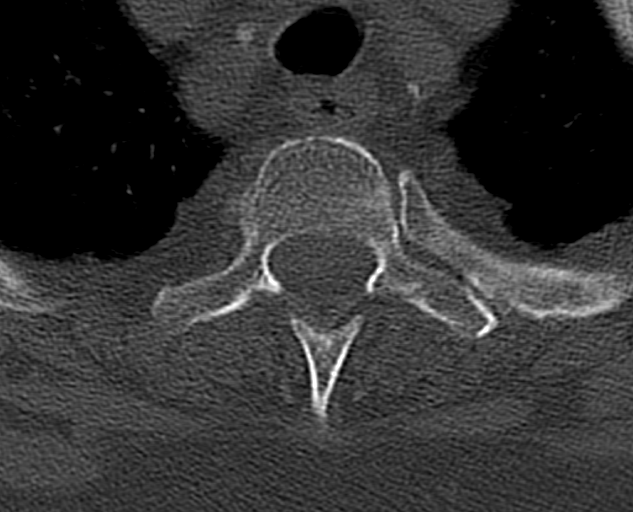
[im 37/91  bone]
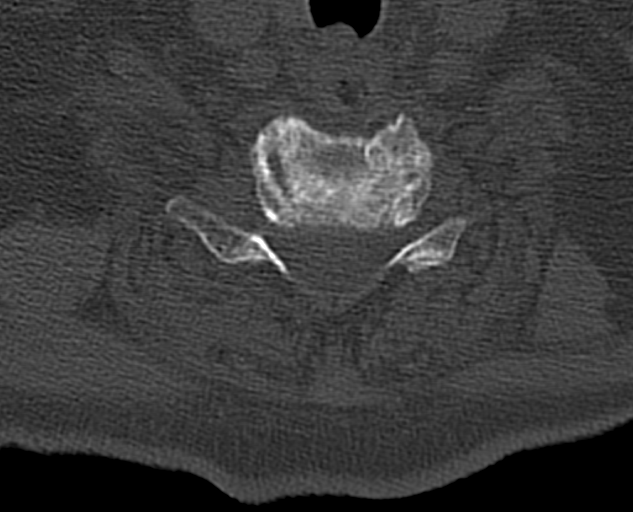
[im 55/91  bone]
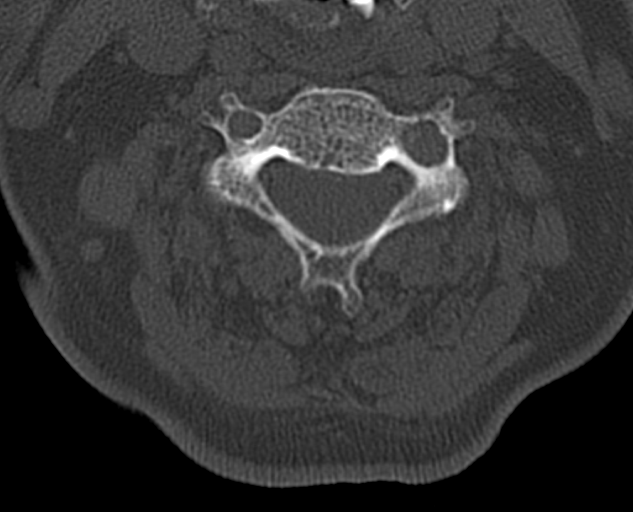
[im 73/91  bone]
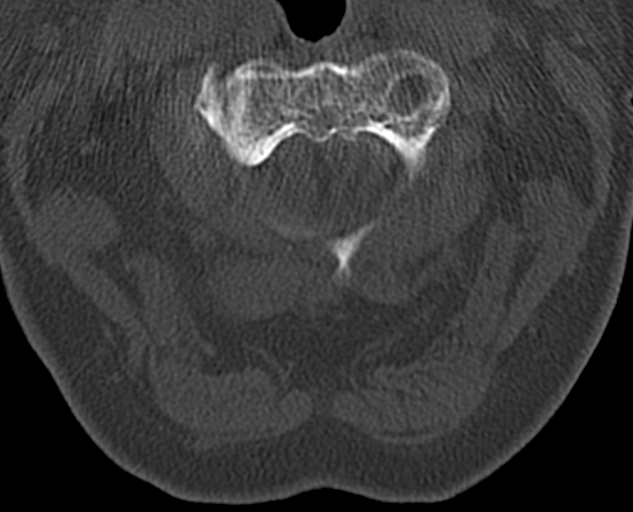

[Series 10: coronal bone · coronal · 0.21mm/px · 1 of 47 slices shown]
[im 24/47  bone]
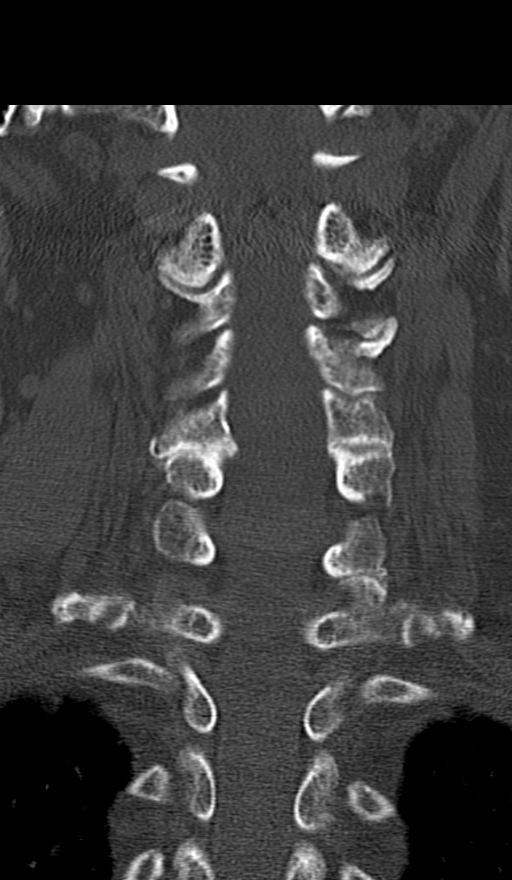

[Series 11: sagittal bone · sagittal · 0.18mm/px · 4 of 47 slices shown]
[im 10/47  bone]
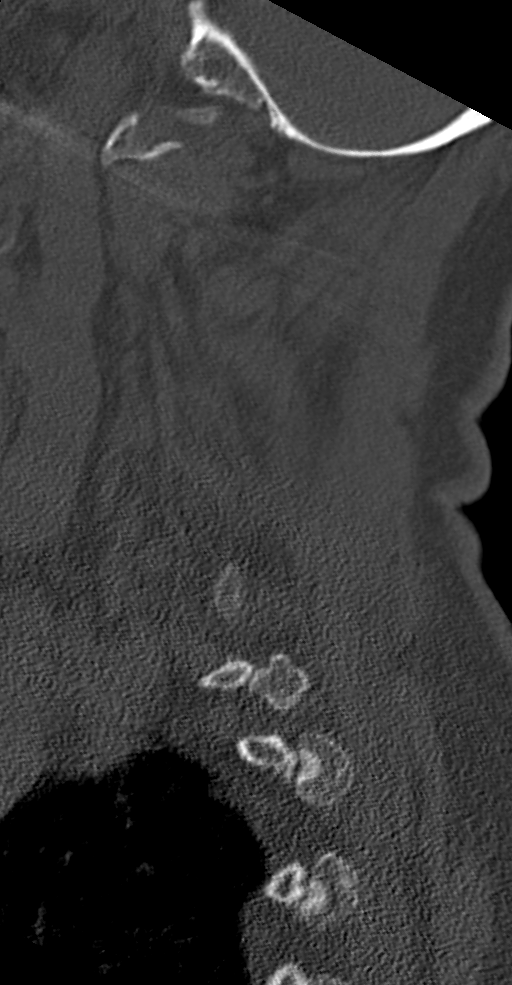
[im 19/47  bone]
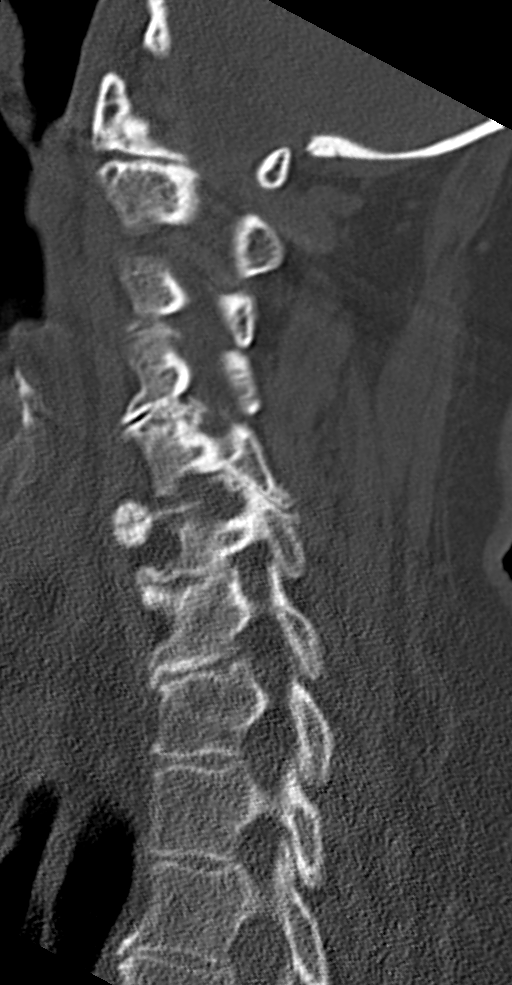
[im 28/47  bone]
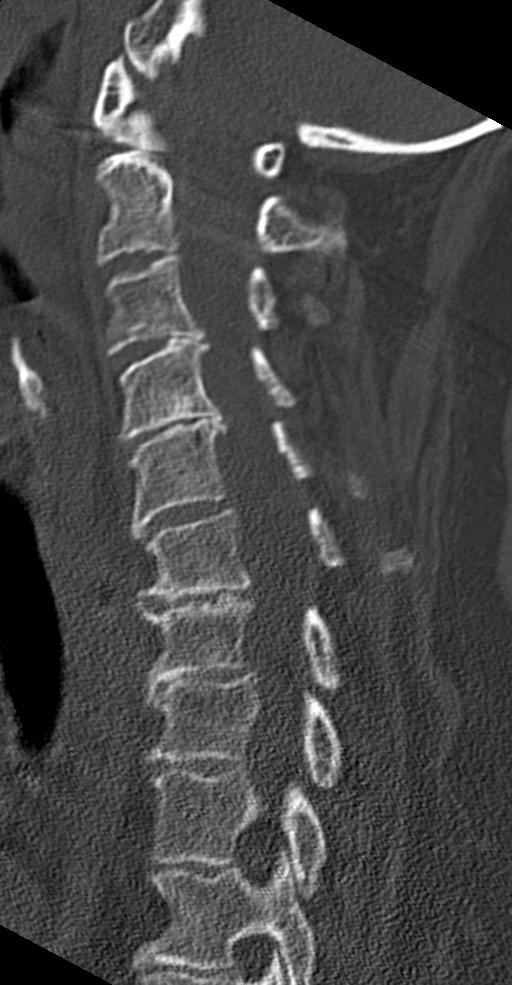
[im 37/47  bone]
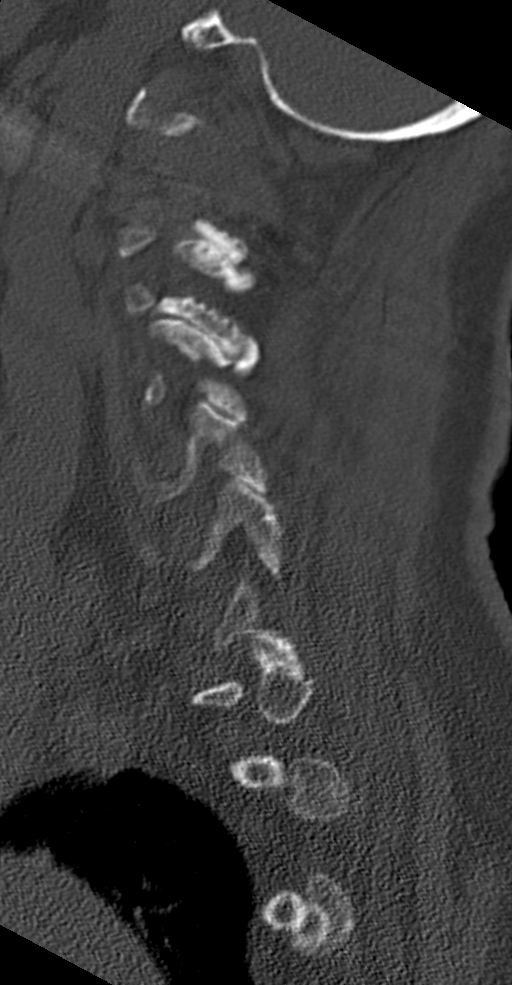

[12 of 33 positions shown; findings below may reference images not displayed]

FINDINGS: CT HEAD FINDINGS

Brain: Mild superficial atrophy. No acute intracranial hemorrhage,
midline shift or edema. No large vascular territory infarct. Minimal
chronic small vessel ischemic disease of periventricular white
matter. No intra-axial mass nor extra-axial fluid collections.
Midline fourth ventricle and basal cisterns.

Vascular: No hyperdense vessel sign. Atherosclerotic calcifications
of the carotid siphons.

Skull: No acute skull fracture.  No suspicious osseous lesions.

Sinuses/Orbits: No acute finding.

Other: Left frontal scalp hematoma.

CT CERVICAL SPINE FINDINGS

Alignment: Reversal of cervical lordosis. Osteoarthritis of the
atlantodental interval. Intact craniocervical relationship.

Skull base and vertebrae: No acute cervical spine fracture. No
jumped or perched facets. Intact skull base.

Soft tissues and spinal canal: No prevertebral soft tissue swelling.
No visible canal hematoma.

Disc levels: Mild disc space narrowing C2 through C4 and C5-6 with
marked disc space narrowing at C4-5, C6-7 and C7-T1. Degenerative
facet arthropathy is noted bilaterally at C2-3 and C3-4 on the left
at C5-6 on the right. Associated uncovertebral joint osteoarthritis
with uncinate spurring is seen bilaterally at C3-4, C4-5 and C6-7.

Upper chest: Negative.

Other: None
IMPRESSION: 1. Chronic mild superficial atrophy and small vessel ischemic
disease. No acute intracranial abnormality.
2. Mild left frontal scalp contusion.  No underlying skull fracture.
3. Cervical spondylosis without acute cervical spine fracture.

## 2019-04-06 DIAGNOSIS — R0901 Asphyxia: Secondary | ICD-10-CM | POA: Diagnosis not present

## 2019-04-06 DIAGNOSIS — J449 Chronic obstructive pulmonary disease, unspecified: Secondary | ICD-10-CM | POA: Diagnosis not present

## 2019-04-06 DIAGNOSIS — R0609 Other forms of dyspnea: Secondary | ICD-10-CM | POA: Diagnosis not present

## 2019-05-07 DIAGNOSIS — R0609 Other forms of dyspnea: Secondary | ICD-10-CM | POA: Diagnosis not present

## 2019-05-07 DIAGNOSIS — R0901 Asphyxia: Secondary | ICD-10-CM | POA: Diagnosis not present

## 2019-05-07 DIAGNOSIS — J449 Chronic obstructive pulmonary disease, unspecified: Secondary | ICD-10-CM | POA: Diagnosis not present

## 2019-05-10 DIAGNOSIS — C44311 Basal cell carcinoma of skin of nose: Secondary | ICD-10-CM | POA: Diagnosis not present

## 2019-06-01 DIAGNOSIS — H532 Diplopia: Secondary | ICD-10-CM | POA: Diagnosis not present

## 2019-06-06 DIAGNOSIS — R0901 Asphyxia: Secondary | ICD-10-CM | POA: Diagnosis not present

## 2019-06-06 DIAGNOSIS — R0609 Other forms of dyspnea: Secondary | ICD-10-CM | POA: Diagnosis not present

## 2019-06-06 DIAGNOSIS — J449 Chronic obstructive pulmonary disease, unspecified: Secondary | ICD-10-CM | POA: Diagnosis not present

## 2019-06-15 DIAGNOSIS — K5900 Constipation, unspecified: Secondary | ICD-10-CM | POA: Diagnosis not present

## 2019-06-15 DIAGNOSIS — J45909 Unspecified asthma, uncomplicated: Secondary | ICD-10-CM | POA: Diagnosis not present

## 2019-06-15 DIAGNOSIS — H3530 Unspecified macular degeneration: Secondary | ICD-10-CM | POA: Diagnosis not present

## 2019-06-15 DIAGNOSIS — G8929 Other chronic pain: Secondary | ICD-10-CM | POA: Diagnosis not present

## 2019-06-15 DIAGNOSIS — E669 Obesity, unspecified: Secondary | ICD-10-CM | POA: Diagnosis not present

## 2019-06-15 DIAGNOSIS — E785 Hyperlipidemia, unspecified: Secondary | ICD-10-CM | POA: Diagnosis not present

## 2019-06-15 DIAGNOSIS — R69 Illness, unspecified: Secondary | ICD-10-CM | POA: Diagnosis not present

## 2019-06-15 DIAGNOSIS — I10 Essential (primary) hypertension: Secondary | ICD-10-CM | POA: Diagnosis not present

## 2019-06-15 DIAGNOSIS — K219 Gastro-esophageal reflux disease without esophagitis: Secondary | ICD-10-CM | POA: Diagnosis not present

## 2019-06-15 DIAGNOSIS — Z008 Encounter for other general examination: Secondary | ICD-10-CM | POA: Diagnosis not present

## 2019-06-15 DIAGNOSIS — H409 Unspecified glaucoma: Secondary | ICD-10-CM | POA: Diagnosis not present

## 2019-07-01 DIAGNOSIS — H532 Diplopia: Secondary | ICD-10-CM | POA: Diagnosis not present

## 2019-07-01 DIAGNOSIS — H401132 Primary open-angle glaucoma, bilateral, moderate stage: Secondary | ICD-10-CM | POA: Diagnosis not present

## 2019-07-01 DIAGNOSIS — H04123 Dry eye syndrome of bilateral lacrimal glands: Secondary | ICD-10-CM | POA: Diagnosis not present

## 2019-07-07 DIAGNOSIS — R0609 Other forms of dyspnea: Secondary | ICD-10-CM | POA: Diagnosis not present

## 2019-07-07 DIAGNOSIS — R0901 Asphyxia: Secondary | ICD-10-CM | POA: Diagnosis not present

## 2019-07-07 DIAGNOSIS — J449 Chronic obstructive pulmonary disease, unspecified: Secondary | ICD-10-CM | POA: Diagnosis not present

## 2019-07-25 ENCOUNTER — Ambulatory Visit: Payer: Medicare HMO | Attending: Internal Medicine

## 2019-07-25 DIAGNOSIS — Z23 Encounter for immunization: Secondary | ICD-10-CM | POA: Insufficient documentation

## 2019-07-25 NOTE — Progress Notes (Signed)
   Covid-19 Vaccination Clinic  Name:  [name]    MRN: [id number] DOB: [birth year]  07/25/2019  [name] was observed post Covid-19 immunization for 15 minutes without incidence. She was provided with Vaccine Information Sheet and instruction to access the V-Safe system.   [name] was instructed to call 911 with any severe reactions post vaccine: Marland Kitchen Difficulty breathing  . Swelling of your face and throat  . A fast heartbeat  . A bad rash all over your body  . Dizziness and weakness    Immunizations Administered    Name Date Dose VIS Date Route   Pfizer COVID-19 Vaccine 07/25/2019  1:16 PM 0.3 mL 06/11/2019 Intramuscular   Manufacturer: [place]   Lot: [id number]   [place]: [id number]

## 2019-07-29 DIAGNOSIS — C44311 Basal cell carcinoma of skin of nose: Secondary | ICD-10-CM | POA: Diagnosis not present

## 2019-07-29 DIAGNOSIS — L989 Disorder of the skin and subcutaneous tissue, unspecified: Secondary | ICD-10-CM | POA: Diagnosis not present

## 2019-08-07 DIAGNOSIS — R0609 Other forms of dyspnea: Secondary | ICD-10-CM | POA: Diagnosis not present

## 2019-08-07 DIAGNOSIS — J449 Chronic obstructive pulmonary disease, unspecified: Secondary | ICD-10-CM | POA: Diagnosis not present

## 2019-08-07 DIAGNOSIS — R0901 Asphyxia: Secondary | ICD-10-CM | POA: Diagnosis not present

## 2019-08-16 ENCOUNTER — Ambulatory Visit: Payer: Medicare HMO | Attending: Internal Medicine

## 2019-08-16 DIAGNOSIS — Z23 Encounter for immunization: Secondary | ICD-10-CM | POA: Insufficient documentation

## 2019-08-16 NOTE — Progress Notes (Signed)
   Covid-19 Vaccination Clinic  Name:  EMALEE BURK    MRN: [id number] DOB: [birth year]  08/16/2019  Ms. Descoteaux was observed post Covid-19 immunization for 15 minutes without incidence. She was provided with Vaccine Information Sheet and instruction to access the V-Safe system.   Ms. Arnold was instructed to call 911 with any severe reactions post vaccine: Marland Kitchen Difficulty breathing  . Swelling of your face and throat  . A fast heartbeat  . A bad rash all over your body  . Dizziness and weakness    Immunizations Administered    Name Date Dose VIS Date Route   Pfizer COVID-19 Vaccine 08/16/2019  2:01 PM 0.3 mL 06/11/2019 Intramuscular   Manufacturer: [place]   Lot: [id number]   [place]: [id number]

## 2019-09-04 DIAGNOSIS — R0901 Asphyxia: Secondary | ICD-10-CM | POA: Diagnosis not present

## 2019-09-04 DIAGNOSIS — R0609 Other forms of dyspnea: Secondary | ICD-10-CM | POA: Diagnosis not present

## 2019-09-04 DIAGNOSIS — J449 Chronic obstructive pulmonary disease, unspecified: Secondary | ICD-10-CM | POA: Diagnosis not present

## 2019-09-10 DIAGNOSIS — M545 Low back pain: Secondary | ICD-10-CM | POA: Diagnosis not present

## 2019-09-10 DIAGNOSIS — H409 Unspecified glaucoma: Secondary | ICD-10-CM | POA: Diagnosis not present

## 2019-09-10 DIAGNOSIS — J449 Chronic obstructive pulmonary disease, unspecified: Secondary | ICD-10-CM | POA: Diagnosis not present

## 2019-09-10 DIAGNOSIS — E785 Hyperlipidemia, unspecified: Secondary | ICD-10-CM | POA: Diagnosis not present

## 2019-09-10 DIAGNOSIS — K5900 Constipation, unspecified: Secondary | ICD-10-CM | POA: Diagnosis not present

## 2019-09-10 DIAGNOSIS — K219 Gastro-esophageal reflux disease without esophagitis: Secondary | ICD-10-CM | POA: Diagnosis not present

## 2019-09-10 DIAGNOSIS — R7301 Impaired fasting glucose: Secondary | ICD-10-CM | POA: Diagnosis not present

## 2019-09-10 DIAGNOSIS — M810 Age-related osteoporosis without current pathological fracture: Secondary | ICD-10-CM | POA: Diagnosis not present

## 2019-09-10 DIAGNOSIS — M4856XD Collapsed vertebra, not elsewhere classified, lumbar region, subsequent encounter for fracture with routine healing: Secondary | ICD-10-CM | POA: Diagnosis not present

## 2019-09-10 DIAGNOSIS — R69 Illness, unspecified: Secondary | ICD-10-CM | POA: Diagnosis not present

## 2019-09-14 DIAGNOSIS — R7301 Impaired fasting glucose: Secondary | ICD-10-CM | POA: Diagnosis not present

## 2019-09-23 DIAGNOSIS — H04123 Dry eye syndrome of bilateral lacrimal glands: Secondary | ICD-10-CM | POA: Diagnosis not present

## 2019-10-05 DIAGNOSIS — R0901 Asphyxia: Secondary | ICD-10-CM | POA: Diagnosis not present

## 2019-10-05 DIAGNOSIS — R0609 Other forms of dyspnea: Secondary | ICD-10-CM | POA: Diagnosis not present

## 2019-10-05 DIAGNOSIS — J449 Chronic obstructive pulmonary disease, unspecified: Secondary | ICD-10-CM | POA: Diagnosis not present

## 2019-11-04 DIAGNOSIS — J449 Chronic obstructive pulmonary disease, unspecified: Secondary | ICD-10-CM | POA: Diagnosis not present

## 2019-11-04 DIAGNOSIS — R0609 Other forms of dyspnea: Secondary | ICD-10-CM | POA: Diagnosis not present

## 2019-11-04 DIAGNOSIS — R0901 Asphyxia: Secondary | ICD-10-CM | POA: Diagnosis not present

## 2019-12-02 DIAGNOSIS — Z1231 Encounter for screening mammogram for malignant neoplasm of breast: Secondary | ICD-10-CM | POA: Diagnosis not present

## 2019-12-05 DIAGNOSIS — R0609 Other forms of dyspnea: Secondary | ICD-10-CM | POA: Diagnosis not present

## 2019-12-05 DIAGNOSIS — J449 Chronic obstructive pulmonary disease, unspecified: Secondary | ICD-10-CM | POA: Diagnosis not present

## 2019-12-05 DIAGNOSIS — R0901 Asphyxia: Secondary | ICD-10-CM | POA: Diagnosis not present

## 2020-01-04 DIAGNOSIS — R0609 Other forms of dyspnea: Secondary | ICD-10-CM | POA: Diagnosis not present

## 2020-01-04 DIAGNOSIS — R0901 Asphyxia: Secondary | ICD-10-CM | POA: Diagnosis not present

## 2020-01-04 DIAGNOSIS — J449 Chronic obstructive pulmonary disease, unspecified: Secondary | ICD-10-CM | POA: Diagnosis not present

## 2020-02-04 DIAGNOSIS — R0609 Other forms of dyspnea: Secondary | ICD-10-CM | POA: Diagnosis not present

## 2020-02-04 DIAGNOSIS — J449 Chronic obstructive pulmonary disease, unspecified: Secondary | ICD-10-CM | POA: Diagnosis not present

## 2020-02-04 DIAGNOSIS — R0901 Asphyxia: Secondary | ICD-10-CM | POA: Diagnosis not present

## 2020-02-08 DIAGNOSIS — H401132 Primary open-angle glaucoma, bilateral, moderate stage: Secondary | ICD-10-CM | POA: Diagnosis not present

## 2020-02-08 DIAGNOSIS — H04123 Dry eye syndrome of bilateral lacrimal glands: Secondary | ICD-10-CM | POA: Diagnosis not present

## 2020-02-08 DIAGNOSIS — H353131 Nonexudative age-related macular degeneration, bilateral, early dry stage: Secondary | ICD-10-CM | POA: Diagnosis not present

## 2020-02-08 DIAGNOSIS — H35373 Puckering of macula, bilateral: Secondary | ICD-10-CM | POA: Diagnosis not present

## 2020-02-28 DIAGNOSIS — F410 Panic disorder [episodic paroxysmal anxiety] without agoraphobia: Secondary | ICD-10-CM | POA: Diagnosis not present

## 2020-02-28 DIAGNOSIS — R69 Illness, unspecified: Secondary | ICD-10-CM | POA: Diagnosis not present

## 2020-02-28 DIAGNOSIS — I499 Cardiac arrhythmia, unspecified: Secondary | ICD-10-CM | POA: Diagnosis not present

## 2020-02-28 DIAGNOSIS — K219 Gastro-esophageal reflux disease without esophagitis: Secondary | ICD-10-CM | POA: Diagnosis not present

## 2020-02-28 DIAGNOSIS — F411 Generalized anxiety disorder: Secondary | ICD-10-CM | POA: Diagnosis not present

## 2020-02-28 DIAGNOSIS — E785 Hyperlipidemia, unspecified: Secondary | ICD-10-CM | POA: Diagnosis not present

## 2020-02-28 DIAGNOSIS — G8929 Other chronic pain: Secondary | ICD-10-CM | POA: Diagnosis not present

## 2020-02-28 DIAGNOSIS — K08409 Partial loss of teeth, unspecified cause, unspecified class: Secondary | ICD-10-CM | POA: Diagnosis not present

## 2020-02-28 DIAGNOSIS — E669 Obesity, unspecified: Secondary | ICD-10-CM | POA: Diagnosis not present

## 2020-02-28 DIAGNOSIS — H409 Unspecified glaucoma: Secondary | ICD-10-CM | POA: Diagnosis not present

## 2020-03-06 DIAGNOSIS — J449 Chronic obstructive pulmonary disease, unspecified: Secondary | ICD-10-CM | POA: Diagnosis not present

## 2020-03-06 DIAGNOSIS — R0901 Asphyxia: Secondary | ICD-10-CM | POA: Diagnosis not present

## 2020-03-06 DIAGNOSIS — R0609 Other forms of dyspnea: Secondary | ICD-10-CM | POA: Diagnosis not present

## 2020-03-14 DIAGNOSIS — H532 Diplopia: Secondary | ICD-10-CM | POA: Diagnosis not present

## 2020-03-27 DIAGNOSIS — H5034 Intermittent alternating exotropia: Secondary | ICD-10-CM | POA: Diagnosis not present

## 2020-03-27 DIAGNOSIS — H5021 Vertical strabismus, right eye: Secondary | ICD-10-CM | POA: Diagnosis not present

## 2020-03-31 DIAGNOSIS — R69 Illness, unspecified: Secondary | ICD-10-CM | POA: Diagnosis not present

## 2020-04-05 DIAGNOSIS — J449 Chronic obstructive pulmonary disease, unspecified: Secondary | ICD-10-CM | POA: Diagnosis not present

## 2020-04-05 DIAGNOSIS — R0901 Asphyxia: Secondary | ICD-10-CM | POA: Diagnosis not present

## 2020-04-05 DIAGNOSIS — R0609 Other forms of dyspnea: Secondary | ICD-10-CM | POA: Diagnosis not present

## 2020-04-12 DIAGNOSIS — E559 Vitamin D deficiency, unspecified: Secondary | ICD-10-CM | POA: Diagnosis not present

## 2020-04-12 DIAGNOSIS — R7301 Impaired fasting glucose: Secondary | ICD-10-CM | POA: Diagnosis not present

## 2020-04-12 DIAGNOSIS — E785 Hyperlipidemia, unspecified: Secondary | ICD-10-CM | POA: Diagnosis not present

## 2020-04-17 DIAGNOSIS — R82998 Other abnormal findings in urine: Secondary | ICD-10-CM | POA: Diagnosis not present

## 2020-04-17 DIAGNOSIS — E785 Hyperlipidemia, unspecified: Secondary | ICD-10-CM | POA: Diagnosis not present

## 2020-04-19 DIAGNOSIS — M810 Age-related osteoporosis without current pathological fracture: Secondary | ICD-10-CM | POA: Diagnosis not present

## 2020-04-19 DIAGNOSIS — I872 Venous insufficiency (chronic) (peripheral): Secondary | ICD-10-CM | POA: Diagnosis not present

## 2020-04-19 DIAGNOSIS — E785 Hyperlipidemia, unspecified: Secondary | ICD-10-CM | POA: Diagnosis not present

## 2020-04-19 DIAGNOSIS — R69 Illness, unspecified: Secondary | ICD-10-CM | POA: Diagnosis not present

## 2020-04-19 DIAGNOSIS — Z0000 Encounter for general adult medical examination without abnormal findings: Secondary | ICD-10-CM | POA: Diagnosis not present

## 2020-04-19 DIAGNOSIS — J449 Chronic obstructive pulmonary disease, unspecified: Secondary | ICD-10-CM | POA: Diagnosis not present

## 2020-04-19 DIAGNOSIS — R2689 Other abnormalities of gait and mobility: Secondary | ICD-10-CM | POA: Diagnosis not present

## 2020-04-19 DIAGNOSIS — E559 Vitamin D deficiency, unspecified: Secondary | ICD-10-CM | POA: Diagnosis not present

## 2020-04-19 DIAGNOSIS — K219 Gastro-esophageal reflux disease without esophagitis: Secondary | ICD-10-CM | POA: Diagnosis not present

## 2020-05-06 DIAGNOSIS — J449 Chronic obstructive pulmonary disease, unspecified: Secondary | ICD-10-CM | POA: Diagnosis not present

## 2020-05-06 DIAGNOSIS — R0901 Asphyxia: Secondary | ICD-10-CM | POA: Diagnosis not present

## 2020-05-06 DIAGNOSIS — R0609 Other forms of dyspnea: Secondary | ICD-10-CM | POA: Diagnosis not present

## 2020-06-05 DIAGNOSIS — J449 Chronic obstructive pulmonary disease, unspecified: Secondary | ICD-10-CM | POA: Diagnosis not present

## 2020-06-05 DIAGNOSIS — R0901 Asphyxia: Secondary | ICD-10-CM | POA: Diagnosis not present

## 2020-06-05 DIAGNOSIS — R0609 Other forms of dyspnea: Secondary | ICD-10-CM | POA: Diagnosis not present

## 2020-07-06 DIAGNOSIS — J449 Chronic obstructive pulmonary disease, unspecified: Secondary | ICD-10-CM | POA: Diagnosis not present

## 2020-07-06 DIAGNOSIS — R0609 Other forms of dyspnea: Secondary | ICD-10-CM | POA: Diagnosis not present

## 2020-07-06 DIAGNOSIS — R0901 Asphyxia: Secondary | ICD-10-CM | POA: Diagnosis not present

## 2020-07-14 DIAGNOSIS — H401132 Primary open-angle glaucoma, bilateral, moderate stage: Secondary | ICD-10-CM | POA: Diagnosis not present

## 2020-08-06 DIAGNOSIS — J449 Chronic obstructive pulmonary disease, unspecified: Secondary | ICD-10-CM | POA: Diagnosis not present

## 2020-08-06 DIAGNOSIS — R0609 Other forms of dyspnea: Secondary | ICD-10-CM | POA: Diagnosis not present

## 2020-08-06 DIAGNOSIS — R0901 Asphyxia: Secondary | ICD-10-CM | POA: Diagnosis not present

## 2020-09-03 DIAGNOSIS — R0901 Asphyxia: Secondary | ICD-10-CM | POA: Diagnosis not present

## 2020-09-03 DIAGNOSIS — R0609 Other forms of dyspnea: Secondary | ICD-10-CM | POA: Diagnosis not present

## 2020-09-03 DIAGNOSIS — J449 Chronic obstructive pulmonary disease, unspecified: Secondary | ICD-10-CM | POA: Diagnosis not present

## 2020-09-28 DIAGNOSIS — K219 Gastro-esophageal reflux disease without esophagitis: Secondary | ICD-10-CM | POA: Diagnosis not present

## 2020-09-28 DIAGNOSIS — M179 Osteoarthritis of knee, unspecified: Secondary | ICD-10-CM | POA: Diagnosis not present

## 2020-09-28 DIAGNOSIS — J449 Chronic obstructive pulmonary disease, unspecified: Secondary | ICD-10-CM | POA: Diagnosis not present

## 2020-09-28 DIAGNOSIS — R69 Illness, unspecified: Secondary | ICD-10-CM | POA: Diagnosis not present

## 2020-09-28 DIAGNOSIS — J309 Allergic rhinitis, unspecified: Secondary | ICD-10-CM | POA: Diagnosis not present

## 2020-09-28 DIAGNOSIS — K5900 Constipation, unspecified: Secondary | ICD-10-CM | POA: Diagnosis not present

## 2020-09-28 DIAGNOSIS — M4856XA Collapsed vertebra, not elsewhere classified, lumbar region, initial encounter for fracture: Secondary | ICD-10-CM | POA: Diagnosis not present

## 2020-09-28 DIAGNOSIS — M810 Age-related osteoporosis without current pathological fracture: Secondary | ICD-10-CM | POA: Diagnosis not present

## 2020-09-28 DIAGNOSIS — E785 Hyperlipidemia, unspecified: Secondary | ICD-10-CM | POA: Diagnosis not present

## 2020-10-04 DIAGNOSIS — J449 Chronic obstructive pulmonary disease, unspecified: Secondary | ICD-10-CM | POA: Diagnosis not present

## 2020-10-04 DIAGNOSIS — R0609 Other forms of dyspnea: Secondary | ICD-10-CM | POA: Diagnosis not present

## 2020-10-04 DIAGNOSIS — R0901 Asphyxia: Secondary | ICD-10-CM | POA: Diagnosis not present

## 2020-10-26 DIAGNOSIS — J45909 Unspecified asthma, uncomplicated: Secondary | ICD-10-CM | POA: Diagnosis not present

## 2020-10-26 DIAGNOSIS — M810 Age-related osteoporosis without current pathological fracture: Secondary | ICD-10-CM | POA: Diagnosis not present

## 2020-10-26 DIAGNOSIS — R69 Illness, unspecified: Secondary | ICD-10-CM | POA: Diagnosis not present

## 2020-10-26 DIAGNOSIS — E785 Hyperlipidemia, unspecified: Secondary | ICD-10-CM | POA: Diagnosis not present

## 2020-10-26 DIAGNOSIS — R7301 Impaired fasting glucose: Secondary | ICD-10-CM | POA: Diagnosis not present

## 2020-10-26 DIAGNOSIS — M8580 Other specified disorders of bone density and structure, unspecified site: Secondary | ICD-10-CM | POA: Diagnosis not present

## 2020-10-26 DIAGNOSIS — R2689 Other abnormalities of gait and mobility: Secondary | ICD-10-CM | POA: Diagnosis not present

## 2020-10-26 DIAGNOSIS — Z6834 Body mass index (BMI) 34.0-34.9, adult: Secondary | ICD-10-CM | POA: Diagnosis not present

## 2020-10-26 DIAGNOSIS — E559 Vitamin D deficiency, unspecified: Secondary | ICD-10-CM | POA: Diagnosis not present

## 2020-11-03 DIAGNOSIS — J449 Chronic obstructive pulmonary disease, unspecified: Secondary | ICD-10-CM | POA: Diagnosis not present

## 2020-11-03 DIAGNOSIS — R0901 Asphyxia: Secondary | ICD-10-CM | POA: Diagnosis not present

## 2020-11-03 DIAGNOSIS — R0609 Other forms of dyspnea: Secondary | ICD-10-CM | POA: Diagnosis not present

## 2020-11-09 ENCOUNTER — Encounter (HOSPITAL_COMMUNITY): Payer: Medicare HMO

## 2020-11-10 DIAGNOSIS — H401132 Primary open-angle glaucoma, bilateral, moderate stage: Secondary | ICD-10-CM | POA: Diagnosis not present

## 2020-11-10 DIAGNOSIS — H353131 Nonexudative age-related macular degeneration, bilateral, early dry stage: Secondary | ICD-10-CM | POA: Diagnosis not present

## 2020-11-10 DIAGNOSIS — H532 Diplopia: Secondary | ICD-10-CM | POA: Diagnosis not present

## 2020-11-27 DIAGNOSIS — G2581 Restless legs syndrome: Secondary | ICD-10-CM | POA: Diagnosis not present

## 2020-11-27 DIAGNOSIS — J449 Chronic obstructive pulmonary disease, unspecified: Secondary | ICD-10-CM | POA: Diagnosis not present

## 2020-11-27 DIAGNOSIS — H409 Unspecified glaucoma: Secondary | ICD-10-CM | POA: Diagnosis not present

## 2020-11-27 DIAGNOSIS — E876 Hypokalemia: Secondary | ICD-10-CM | POA: Diagnosis not present

## 2020-11-27 DIAGNOSIS — K5730 Diverticulosis of large intestine without perforation or abscess without bleeding: Secondary | ICD-10-CM | POA: Diagnosis not present

## 2020-11-27 DIAGNOSIS — F419 Anxiety disorder, unspecified: Secondary | ICD-10-CM | POA: Diagnosis not present

## 2020-11-27 DIAGNOSIS — H8110 Benign paroxysmal vertigo, unspecified ear: Secondary | ICD-10-CM | POA: Diagnosis not present

## 2020-11-27 DIAGNOSIS — H3530 Unspecified macular degeneration: Secondary | ICD-10-CM | POA: Diagnosis not present

## 2020-11-27 DIAGNOSIS — R69 Illness, unspecified: Secondary | ICD-10-CM | POA: Diagnosis not present

## 2020-11-27 DIAGNOSIS — K219 Gastro-esophageal reflux disease without esophagitis: Secondary | ICD-10-CM | POA: Diagnosis not present

## 2020-11-27 DIAGNOSIS — M159 Polyosteoarthritis, unspecified: Secondary | ICD-10-CM | POA: Diagnosis not present

## 2020-11-27 DIAGNOSIS — Z9981 Dependence on supplemental oxygen: Secondary | ICD-10-CM | POA: Diagnosis not present

## 2020-11-27 DIAGNOSIS — E669 Obesity, unspecified: Secondary | ICD-10-CM | POA: Diagnosis not present

## 2020-11-27 DIAGNOSIS — M8580 Other specified disorders of bone density and structure, unspecified site: Secondary | ICD-10-CM | POA: Diagnosis not present

## 2020-11-27 DIAGNOSIS — S82841A Displaced bimalleolar fracture of right lower leg, initial encounter for closed fracture: Secondary | ICD-10-CM | POA: Diagnosis not present

## 2020-11-27 DIAGNOSIS — Z8701 Personal history of pneumonia (recurrent): Secondary | ICD-10-CM | POA: Diagnosis not present

## 2020-11-27 DIAGNOSIS — E785 Hyperlipidemia, unspecified: Secondary | ICD-10-CM | POA: Diagnosis not present

## 2020-11-27 DIAGNOSIS — Z96653 Presence of artificial knee joint, bilateral: Secondary | ICD-10-CM | POA: Diagnosis not present

## 2020-11-27 DIAGNOSIS — E871 Hypo-osmolality and hyponatremia: Secondary | ICD-10-CM | POA: Diagnosis not present

## 2020-11-27 DIAGNOSIS — K922 Gastrointestinal hemorrhage, unspecified: Secondary | ICD-10-CM | POA: Diagnosis not present

## 2020-11-27 DIAGNOSIS — D649 Anemia, unspecified: Secondary | ICD-10-CM | POA: Diagnosis not present

## 2020-11-27 DIAGNOSIS — Z6833 Body mass index (BMI) 33.0-33.9, adult: Secondary | ICD-10-CM | POA: Diagnosis not present

## 2020-11-27 DIAGNOSIS — Z85828 Personal history of other malignant neoplasm of skin: Secondary | ICD-10-CM | POA: Diagnosis not present

## 2020-11-27 DIAGNOSIS — I700 Atherosclerosis of aorta: Secondary | ICD-10-CM | POA: Diagnosis not present

## 2020-12-01 DIAGNOSIS — H353131 Nonexudative age-related macular degeneration, bilateral, early dry stage: Secondary | ICD-10-CM | POA: Diagnosis not present

## 2020-12-01 DIAGNOSIS — H401132 Primary open-angle glaucoma, bilateral, moderate stage: Secondary | ICD-10-CM | POA: Diagnosis not present

## 2020-12-01 DIAGNOSIS — H04123 Dry eye syndrome of bilateral lacrimal glands: Secondary | ICD-10-CM | POA: Diagnosis not present

## 2020-12-01 DIAGNOSIS — H35373 Puckering of macula, bilateral: Secondary | ICD-10-CM | POA: Diagnosis not present

## 2020-12-04 DIAGNOSIS — R0901 Asphyxia: Secondary | ICD-10-CM | POA: Diagnosis not present

## 2020-12-04 DIAGNOSIS — J449 Chronic obstructive pulmonary disease, unspecified: Secondary | ICD-10-CM | POA: Diagnosis not present

## 2020-12-04 DIAGNOSIS — R0609 Other forms of dyspnea: Secondary | ICD-10-CM | POA: Diagnosis not present

## 2020-12-07 DIAGNOSIS — Z1231 Encounter for screening mammogram for malignant neoplasm of breast: Secondary | ICD-10-CM | POA: Diagnosis not present

## 2020-12-18 ENCOUNTER — Inpatient Hospital Stay (HOSPITAL_COMMUNITY)
Admission: EM | Admit: 2020-12-18 | Discharge: 2020-12-24 | DRG: 357 | Disposition: A | Discharge status: Home Health | Payer: Medicare HMO | Attending: Family Medicine | Admitting: Family Medicine

## 2020-12-18 ENCOUNTER — Other Ambulatory Visit: Payer: Self-pay

## 2020-12-18 ENCOUNTER — Encounter (HOSPITAL_COMMUNITY): Payer: Self-pay | Admitting: *Deleted

## 2020-12-18 DIAGNOSIS — F419 Anxiety disorder, unspecified: Secondary | ICD-10-CM | POA: Diagnosis present

## 2020-12-18 DIAGNOSIS — I10 Essential (primary) hypertension: Secondary | ICD-10-CM | POA: Diagnosis present

## 2020-12-18 DIAGNOSIS — D62 Acute posthemorrhagic anemia: Secondary | ICD-10-CM | POA: Diagnosis not present

## 2020-12-18 DIAGNOSIS — F32A Depression, unspecified: Secondary | ICD-10-CM | POA: Diagnosis present

## 2020-12-18 DIAGNOSIS — M8580 Other specified disorders of bone density and structure, unspecified site: Secondary | ICD-10-CM | POA: Diagnosis present

## 2020-12-18 DIAGNOSIS — E860 Dehydration: Secondary | ICD-10-CM | POA: Diagnosis not present

## 2020-12-18 DIAGNOSIS — K922 Gastrointestinal hemorrhage, unspecified: Secondary | ICD-10-CM

## 2020-12-18 DIAGNOSIS — J9611 Chronic respiratory failure with hypoxia: Secondary | ICD-10-CM | POA: Diagnosis not present

## 2020-12-18 DIAGNOSIS — R5383 Other fatigue: Secondary | ICD-10-CM | POA: Diagnosis not present

## 2020-12-18 DIAGNOSIS — H8110 Benign paroxysmal vertigo, unspecified ear: Secondary | ICD-10-CM | POA: Diagnosis present

## 2020-12-18 DIAGNOSIS — Z87891 Personal history of nicotine dependence: Secondary | ICD-10-CM

## 2020-12-18 DIAGNOSIS — Z9851 Tubal ligation status: Secondary | ICD-10-CM

## 2020-12-18 DIAGNOSIS — M1909 Primary osteoarthritis, other specified site: Secondary | ICD-10-CM | POA: Diagnosis present

## 2020-12-18 DIAGNOSIS — K921 Melena: Secondary | ICD-10-CM | POA: Diagnosis not present

## 2020-12-18 DIAGNOSIS — H409 Unspecified glaucoma: Secondary | ICD-10-CM | POA: Diagnosis present

## 2020-12-18 DIAGNOSIS — Z79899 Other long term (current) drug therapy: Secondary | ICD-10-CM

## 2020-12-18 DIAGNOSIS — H3530 Unspecified macular degeneration: Secondary | ICD-10-CM | POA: Diagnosis present

## 2020-12-18 DIAGNOSIS — R1310 Dysphagia, unspecified: Secondary | ICD-10-CM | POA: Diagnosis not present

## 2020-12-18 DIAGNOSIS — I700 Atherosclerosis of aorta: Secondary | ICD-10-CM | POA: Diagnosis not present

## 2020-12-18 DIAGNOSIS — M19012 Primary osteoarthritis, left shoulder: Secondary | ICD-10-CM | POA: Diagnosis present

## 2020-12-18 DIAGNOSIS — K5731 Diverticulosis of large intestine without perforation or abscess with bleeding: Principal | ICD-10-CM | POA: Diagnosis present

## 2020-12-18 DIAGNOSIS — M19072 Primary osteoarthritis, left ankle and foot: Secondary | ICD-10-CM | POA: Diagnosis present

## 2020-12-18 DIAGNOSIS — J449 Chronic obstructive pulmonary disease, unspecified: Secondary | ICD-10-CM | POA: Diagnosis present

## 2020-12-18 DIAGNOSIS — Z85828 Personal history of other malignant neoplasm of skin: Secondary | ICD-10-CM

## 2020-12-18 DIAGNOSIS — M19011 Primary osteoarthritis, right shoulder: Secondary | ICD-10-CM | POA: Diagnosis present

## 2020-12-18 DIAGNOSIS — K219 Gastro-esophageal reflux disease without esophagitis: Secondary | ICD-10-CM | POA: Diagnosis present

## 2020-12-18 DIAGNOSIS — Z888 Allergy status to other drugs, medicaments and biological substances status: Secondary | ICD-10-CM

## 2020-12-18 DIAGNOSIS — E871 Hypo-osmolality and hyponatremia: Secondary | ICD-10-CM | POA: Diagnosis not present

## 2020-12-18 DIAGNOSIS — K5730 Diverticulosis of large intestine without perforation or abscess without bleeding: Secondary | ICD-10-CM | POA: Diagnosis not present

## 2020-12-18 DIAGNOSIS — Z20822 Contact with and (suspected) exposure to covid-19: Secondary | ICD-10-CM | POA: Diagnosis present

## 2020-12-18 DIAGNOSIS — E875 Hyperkalemia: Secondary | ICD-10-CM | POA: Diagnosis not present

## 2020-12-18 DIAGNOSIS — M19071 Primary osteoarthritis, right ankle and foot: Secondary | ICD-10-CM | POA: Diagnosis present

## 2020-12-18 DIAGNOSIS — Z9889 Other specified postprocedural states: Secondary | ICD-10-CM | POA: Diagnosis not present

## 2020-12-18 DIAGNOSIS — Q272 Other congenital malformations of renal artery: Secondary | ICD-10-CM | POA: Diagnosis not present

## 2020-12-18 DIAGNOSIS — Z881 Allergy status to other antibiotic agents status: Secondary | ICD-10-CM

## 2020-12-18 DIAGNOSIS — Z9981 Dependence on supplemental oxygen: Secondary | ICD-10-CM

## 2020-12-18 DIAGNOSIS — R531 Weakness: Secondary | ICD-10-CM | POA: Diagnosis not present

## 2020-12-18 DIAGNOSIS — E785 Hyperlipidemia, unspecified: Secondary | ICD-10-CM | POA: Diagnosis present

## 2020-12-18 DIAGNOSIS — Z885 Allergy status to narcotic agent status: Secondary | ICD-10-CM

## 2020-12-18 DIAGNOSIS — Z8249 Family history of ischemic heart disease and other diseases of the circulatory system: Secondary | ICD-10-CM

## 2020-12-18 DIAGNOSIS — D649 Anemia, unspecified: Secondary | ICD-10-CM | POA: Diagnosis not present

## 2020-12-18 DIAGNOSIS — Z8701 Personal history of pneumonia (recurrent): Secondary | ICD-10-CM

## 2020-12-18 DIAGNOSIS — Q630 Accessory kidney: Secondary | ICD-10-CM | POA: Diagnosis not present

## 2020-12-18 LAB — COMPREHENSIVE METABOLIC PANEL
ALT: 12 U/L (ref 0–44)
AST: 19 U/L (ref 15–41)
Albumin: 3.2 g/dL — ABNORMAL LOW (ref 3.5–5.0)
Alkaline Phosphatase: 36 U/L — ABNORMAL LOW (ref 38–126)
Anion gap: 7 (ref 5–15)
BUN: 15 mg/dL (ref 8–23)
CO2: 27 mmol/L (ref 22–32)
Calcium: 8.5 mg/dL — ABNORMAL LOW (ref 8.9–10.3)
Chloride: 104 mmol/L (ref 98–111)
Creatinine, Ser: 0.53 mg/dL (ref 0.44–1.00)
GFR, Estimated: >60 mL/min (ref >60)
Glucose, Bld: 125 mg/dL — ABNORMAL HIGH (ref 70–99)
Potassium: 3.9 mmol/L (ref 3.5–5.1)
Sodium: 138 mmol/L (ref 135–145)
Total Bilirubin: 0.9 mg/dL (ref 0.3–1.2)
Total Protein: 5.4 g/dL — ABNORMAL LOW (ref 6.5–8.1)

## 2020-12-18 LAB — CBC WITH DIFFERENTIAL/PLATELET
Abs Immature Granulocytes: 0.02 10*3/uL (ref 0.00–0.07)
Basophils Absolute: 0 10*3/uL (ref 0.0–0.1)
Basophils Relative: 1 %
Eosinophils Absolute: 0.1 10*3/uL (ref 0.0–0.5)
Eosinophils Relative: 2 %
HCT: 21.1 % — ABNORMAL LOW (ref 36.0–46.0)
Hemoglobin: 6.4 g/dL — CL (ref 12.0–15.0)
Immature Granulocytes: 0 %
Lymphocytes Relative: 32 %
Lymphs Abs: 2.1 10*3/uL (ref 0.7–4.0)
MCH: 27.1 pg (ref 26.0–34.0)
MCHC: 30.3 g/dL (ref 30.0–36.0)
MCV: 89.4 fL (ref 80.0–100.0)
Monocytes Absolute: 0.5 10*3/uL (ref 0.1–1.0)
Monocytes Relative: 8 %
Neutro Abs: 3.7 10*3/uL (ref 1.7–7.7)
Neutrophils Relative %: 57 %
Platelets: 173 10*3/uL (ref 150–400)
RBC: 2.36 MIL/uL — ABNORMAL LOW (ref 3.87–5.11)
RDW: 16.7 % — ABNORMAL HIGH (ref 11.5–15.5)
WBC: 6.5 10*3/uL (ref 4.0–10.5)
nRBC: 0 % (ref 0.0–0.2)

## 2020-12-18 LAB — SARS CORONAVIRUS 2 (TAT 6-24 HRS): SARS Coronavirus 2: NEGATIVE

## 2020-12-18 LAB — PROTIME-INR
INR: 1.2 (ref 0.8–1.2)
Prothrombin Time: 15 seconds (ref 11.4–15.2)

## 2020-12-18 LAB — SAMPLE TO BLOOD BANK

## 2020-12-18 LAB — PREPARE RBC (CROSSMATCH)

## 2020-12-18 LAB — POC OCCULT BLOOD, ED: Fecal Occult Bld: POSITIVE — AB

## 2020-12-18 MED ORDER — ALBUTEROL SULFATE (2.5 MG/3ML) 0.083% IN NEBU: 2.5000 mg | INHALATION_SOLUTION | Freq: Every day | RESPIRATORY_TRACT | Status: DC | PRN

## 2020-12-18 MED ORDER — SODIUM CHLORIDE 0.9% IV SOLUTION: Freq: Once | INTRAVENOUS | Status: AC

## 2020-12-18 MED ORDER — ICAPS AREDS 2 PO CAPS: 1.0000 | ORAL_CAPSULE | Freq: Two times a day (BID) | ORAL | Status: DC

## 2020-12-18 MED ORDER — DICLOFENAC SODIUM 1 % EX GEL
2.0000 g | Freq: Four times a day (QID) | CUTANEOUS | Status: DC
  Administered 2020-12-19 – 2020-12-23 (×12): 2 g via TOPICAL
  Filled 2020-12-18: qty 100

## 2020-12-18 MED ORDER — DULOXETINE HCL 20 MG PO CPEP
20.0000 mg | ORAL_CAPSULE | Freq: Every day | ORAL | Status: DC
  Administered 2020-12-18 – 2020-12-24 (×7): 20 mg via ORAL
  Filled 2020-12-18 (×7): qty 1

## 2020-12-18 MED ORDER — ACETAMINOPHEN 500 MG PO TABS
1000.0000 mg | ORAL_TABLET | Freq: Every day | ORAL | Status: DC | PRN
  Administered 2020-12-19 – 2020-12-21 (×2): 1000 mg via ORAL
  Filled 2020-12-18 (×2): qty 2

## 2020-12-18 MED ORDER — VITAMIN D 25 MCG (1000 UNIT) PO TABS
2000.0000 [IU] | ORAL_TABLET | Freq: Two times a day (BID) | ORAL | Status: DC
  Administered 2020-12-18 – 2020-12-20 (×4): 2000 [IU] via ORAL
  Filled 2020-12-18 (×4): qty 2

## 2020-12-18 MED ORDER — DICLOFENAC SODIUM 1 % TD GEL: 4.0000 g | Freq: Four times a day (QID) | TRANSDERMAL | Status: DC

## 2020-12-18 MED ORDER — DORZOLAMIDE HCL-TIMOLOL MAL 2-0.5 % OP SOLN
1.0000 [drp] | Freq: Two times a day (BID) | OPHTHALMIC | Status: DC
  Administered 2020-12-19 – 2020-12-24 (×11): 1 [drp] via OPHTHALMIC
  Filled 2020-12-18: qty 10

## 2020-12-18 MED ORDER — PANTOPRAZOLE SODIUM 40 MG PO TBEC
40.0000 mg | DELAYED_RELEASE_TABLET | Freq: Every day | ORAL | Status: DC
  Administered 2020-12-19 – 2020-12-24 (×6): 40 mg via ORAL
  Filled 2020-12-18 (×6): qty 1

## 2020-12-18 MED ORDER — ALPRAZOLAM 0.25 MG PO TABS
0.2500 mg | ORAL_TABLET | Freq: Every day | ORAL | Status: DC | PRN
  Administered 2020-12-20 – 2020-12-21 (×2): 0.25 mg via ORAL
  Filled 2020-12-18 (×2): qty 1

## 2020-12-18 MED ORDER — GUAIFENESIN ER 600 MG PO TB12
600.0000 mg | ORAL_TABLET | Freq: Two times a day (BID) | ORAL | Status: DC
  Administered 2020-12-19 – 2020-12-24 (×9): 600 mg via ORAL
  Filled 2020-12-18 (×10): qty 1

## 2020-12-18 MED ORDER — PROSIGHT PO TABS
1.0000 | ORAL_TABLET | Freq: Two times a day (BID) | ORAL | Status: DC
  Administered 2020-12-18 – 2020-12-20 (×4): 1 via ORAL
  Filled 2020-12-18 (×5): qty 1

## 2020-12-18 MED ORDER — SERTRALINE HCL 100 MG PO TABS
100.0000 mg | ORAL_TABLET | Freq: Every day | ORAL | Status: DC
  Administered 2020-12-18 – 2020-12-24 (×7): 100 mg via ORAL
  Filled 2020-12-18 (×7): qty 1

## 2020-12-18 MED ORDER — ADULT MULTIVITAMIN W/MINERALS CH
1.0000 | ORAL_TABLET | Freq: Every day | ORAL | Status: DC
  Administered 2020-12-19 – 2020-12-20 (×2): 1 via ORAL
  Filled 2020-12-18 (×2): qty 1

## 2020-12-18 MED ORDER — SIMVASTATIN 20 MG PO TABS
20.0000 mg | ORAL_TABLET | Freq: Every day | ORAL | Status: DC
  Administered 2020-12-19 – 2020-12-21 (×3): 20 mg via ORAL
  Filled 2020-12-18 (×3): qty 1

## 2020-12-18 MED ORDER — LATANOPROST 0.005 % OP SOLN
1.0000 [drp] | Freq: Every day | OPHTHALMIC | Status: DC
  Administered 2020-12-18 – 2020-12-23 (×6): 1 [drp] via OPHTHALMIC
  Filled 2020-12-18: qty 2.5

## 2020-12-18 MED ORDER — LORATADINE 10 MG PO TABS
10.0000 mg | ORAL_TABLET | Freq: Every day | ORAL | Status: DC
  Administered 2020-12-19 – 2020-12-24 (×6): 10 mg via ORAL
  Filled 2020-12-18 (×6): qty 1

## 2020-12-18 MED ORDER — POLYETHYLENE GLYCOL 3350 17 G PO PACK
17.0000 g | PACK | Freq: Every day | ORAL | Status: DC
  Administered 2020-12-20 – 2020-12-24 (×5): 17 g via ORAL
  Filled 2020-12-18 (×6): qty 1

## 2020-12-18 NOTE — ED Triage Notes (Signed)
Pt reports having intermittent GI bleeding since Friday. Reports blood is bright red, denies having abd pain. Hx of similar episodes in past that resulted in blood transfusion.

## 2020-12-18 NOTE — ED Provider Notes (Signed)
[facility] EMERGENCY DEPARTMENT Provider Note   CSN: [id number] Arrival date & time: 12/18/20  1145     History Chief Complaint  Patient presents with  . Rectal Bleeding    [name] is a 80 y.o. female.  Patient with history of reflux, blood transfusions, respiratory failure, COPD, pneumonia, depression presents with multiple episodes of bright red blood in her stool since Friday.  Patient has intentionally waited at home hoping it would stop as her previous episodes did stop with time.  Patient has required blood transfusions for bleeding.  Patient is not on blood thinning medication.  Patient has had worsening fatigue and weakness since Friday.  Patient does follow with [facility] gastroenterology.  Last bleeding episode she recalls approximately 3 to 4 years ago.  No abdominal pain.  No vomiting.  No fevers or chills.  No shortness of breath. , Diverticular bleeding     Past Medical History:  Diagnosis Date  . Anxiety   . Arthritis    "bilateral knees, shoulders, elbows; neck, pretty widespread" (09/05/2017)  . BPPV (benign paroxysmal positional vertigo)   . Depression   . GERD (gastroesophageal reflux disease)   . Glaucoma, both eyes   . Headache    "probably 2/month" (09/05/2017)  . History of blood transfusion ~ 2008   "related to LGIB"  . Hyperlipemia   . Lower GI bleeding ~ 2008; 09/05/2017   "had to have blood transfusion"  . Macular degeneration, bilateral   . Osteopenia   . Seasonal allergies   . Skin cancer, basal cell 2001   "off my nose, left side"  . Sleeping excessive   . Tinnitus of both ears     Patient Active Problem List   Diagnosis Date Noted  . DOE (dyspnea on exertion) 11/18/2017  . COPD GOLD II  11/18/2017  . Diverticulosis of intestine with bleeding 09/05/2017  . Influenza A 08/08/2017  . Acute respiratory failure with hypoxia ([place]) 08/07/2017  . Depression 08/07/2017  . Hyperlipemia 08/07/2017  . Macular degeneration  08/07/2017  . Glaucoma 08/07/2017  . Weakness 08/07/2017  . Dehydration 08/07/2017  . Lower GI bleeding 10/14/2016  . Diverticulosis of colon 10/14/2016  . Anxiety 10/14/2016  . Lower GI bleed 10/14/2016  . Acute GI bleeding   . 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 Dermatochalasis of both upper eyelids 08/17/2014  . Osteoarthrosis, unspecified whether generalized or localized, lower leg 12/20/2011    Past Surgical History:  Procedure Laterality Date  . BASAL CELL CARCINOMA EXCISION  2001   "off my nose, left side"  . BLEPHAROPLASTY Bilateral   . CATARACT EXTRACTION W/ INTRAOCULAR LENS  IMPLANT, BILATERAL Bilateral 1990's  . EYE SURGERY Bilateral    "to improve vision after cataract OR"  . JOINT REPLACEMENT    . STAPEDES SURGERY Left    "scraped stapedes because it was sticking when it wasn't suppose to"  . TONSILLECTOMY AND ADENOIDECTOMY  1946  . TOTAL KNEE ARTHROPLASTY Left ~ 2008  . TOTAL KNEE ARTHROPLASTY  12/20/2011   Procedure: TOTAL KNEE ARTHROPLASTY;  Surgeon: Augustin Schooling, MD;  Location: [facility];  Service: Orthopedics;  Laterality: Right;  Right Total Knee Arthroplasty  . TUBAL LIGATION  1980's     OB History   No obstetric history on file.     Family History  Problem Relation Age of Onset  . Heart attack Mother   . Heart disease Father   . Heart failure Father   .  Bladder Cancer Father   . Supraventricular tachycardia Sister   . Hypertension Sister     Social History   Tobacco Use  . Smoking status: Former    Packs/day: 0.75    Years: 37.00    Pack years: 27.75    Types: Cigarettes    Quit date: 07/01/1992    Years since quitting: 28.4  . Smokeless tobacco: Never  Vaping Use  . Vaping Use: Never used  Substance Use Topics  . Alcohol use: Yes    Alcohol/week: 7.0 standard drinks    Types: 7 Glasses of wine per week    Comment: 09/05/2017  "6 oz wine, 5 days/wk"  . Drug use: No    Types: Hydrocodone    Home Medications Prior  to Admission medications   Medication Sig Start Date End Date Taking? Authorizing Provider  acetaminophen (TYLENOL) 500 MG tablet Take 1,000 mg by mouth daily as needed for headache.    [provider]  albuterol (PROVENTIL HFA;VENTOLIN HFA) 108 (90 Base) MCG/ACT inhaler Inhale 2 puffs into the lungs daily as needed. For seasonal allergies . Use 2 puffs 3 times daily x 4 days then back to home regimen. 08/10/17   Eugenie Filler, MD  ALPRAZolam Duanne Moron) 0.25 MG tablet Take 1 tablet (0.25 mg total) by mouth daily as needed for anxiety. 09/14/17   Aline August, MD  cholecalciferol (VITAMIN D) 1000 UNITS tablet Take 2,000 Units by mouth 2 (two) times daily.     [provider]  diclofenac sodium (VOLTAREN) 1 % GEL Apply 4 g topically 4 (four) times daily. 08/26/18   Deno Etienne, DO  dorzolamide-timolol (COSOPT) 22.3-6.8 MG/ML ophthalmic solution Place 1 drop into both eyes 2 (two) times daily.    [provider]  DULoxetine (CYMBALTA) 30 MG capsule  02/18/18   [provider]  guaiFENesin (MUCINEX) 600 MG 12 hr tablet Take 600 mg by mouth 2 (two) times daily.    [provider]  latanoprost (XALATAN) 0.005 % ophthalmic solution  02/13/18   [provider]  loratadine (CLARITIN) 10 MG tablet Take 1 tablet (10 mg total) by mouth daily. 08/11/17   Eugenie Filler, MD  Multiple Vitamin (MULTIVITAMIN WITH MINERALS) TABS Take 1 tablet by mouth daily.    [provider]  Multiple Vitamins-Minerals (ICAPS AREDS 2) CAPS Take 1 capsule by mouth 2 (two) times daily.    [provider]  nystatin (MYCOSTATIN/NYSTOP) powder  02/17/18   [provider]  omeprazole (PRILOSEC) 20 MG capsule Take 20 mg by mouth daily.     [provider]  OXYGEN 2lpm with sleep only  [facility]    [provider]  polyethylene glycol (MIRALAX / GLYCOLAX) packet Take 17 g by mouth daily.    [provider]  propranolol (INDERAL) 20  MG tablet Take 20 mg by mouth daily.    [provider]  sertraline (ZOLOFT) 100 MG tablet Take 100 mg by mouth daily.    [provider]  simvastatin (ZOCOR) 20 MG tablet Take 20 mg by mouth daily.    [provider]    Allergies    Morphine and related and Cefadroxil  Review of Systems   Review of Systems  Constitutional:  Positive for fatigue. Negative for chills and fever.  HENT:  Negative for congestion.   Eyes:  Negative for visual disturbance.  Respiratory:  Negative for shortness of breath.   Cardiovascular:  Negative for chest pain.  Gastrointestinal:  Positive  for blood in stool. Negative for abdominal pain and vomiting.  Genitourinary:  Negative for dysuria and flank pain.  Musculoskeletal:  Negative for back pain, neck pain and neck stiffness.  Skin:  Negative for rash.  Neurological:  Positive for weakness and light-headedness. Negative for headaches.   Physical Exam Updated Vital Signs BP 129/61   Pulse 79   Temp 98.1 F (36.7 C) (Oral)   Resp 18   SpO2 98%   Physical Exam Vitals and nursing note reviewed.  Constitutional:      General: She is not in acute distress.    Appearance: She is well-developed.  HENT:     Head: Normocephalic and atraumatic.     Nose: Nose normal.     Mouth/Throat:     Mouth: Mucous membranes are dry.  Eyes:     General:        Right eye: No discharge.        Left eye: No discharge.     Conjunctiva/sclera: Conjunctivae normal.  Neck:     Trachea: No tracheal deviation.  Cardiovascular:     Rate and Rhythm: Normal rate and regular rhythm.     Heart sounds: No murmur heard. Pulmonary:     Effort: Pulmonary effort is normal.     Breath sounds: Normal breath sounds.  Abdominal:     General: There is no distension.     Palpations: Abdomen is soft.     Tenderness: There is no abdominal tenderness. There is no guarding.  Musculoskeletal:     Cervical back: Normal range of motion and neck supple. No  rigidity.  Skin:    General: Skin is warm.     Capillary Refill: Capillary refill takes less than 2 seconds.     Coloration: Skin is pale.     Findings: No rash.  Neurological:     General: No focal deficit present.     Mental Status: She is alert.     Cranial Nerves: No cranial nerve deficit.     Motor: Weakness present.  Psychiatric:        Mood and Affect: Mood normal.    ED Results / Procedures / Treatments   Labs (all labs ordered are listed, but only abnormal results are displayed) Labs Reviewed  COMPREHENSIVE METABOLIC PANEL - Abnormal; Notable for the following components:      Result Value   Glucose, Bld 125 (*)    Calcium 8.5 (*)    Total Protein 5.4 (*)    Albumin 3.2 (*)    Alkaline Phosphatase 36 (*)    All other components within normal limits  CBC WITH DIFFERENTIAL/PLATELET - Abnormal; Notable for the following components:   RBC 2.36 (*)    Hemoglobin 6.4 (*)    HCT 21.1 (*)    RDW 16.7 (*)    All other components within normal limits  POC OCCULT BLOOD, ED - Abnormal; Notable for the following components:   Fecal Occult Bld POSITIVE (*)    All other components within normal limits  PROTIME-INR  SAMPLE TO BLOOD BANK  PREPARE RBC (CROSSMATCH)  TYPE AND SCREEN    EKG None  Radiology No results found.  Procedures .Critical Care  Date/Time: 12/18/2020 5:19 PM Performed by: Elnora Morrison, MD Authorized by: Elnora Morrison, MD   Critical care provider statement:    Critical care time (minutes):  35   Critical care start time:  12/18/2020 4:50 PM   Critical care end time:  12/18/2020 5:25 PM  Critical care time was exclusive of:  Separately billable procedures and treating other patients and teaching time   Critical care was time spent personally by me on the following activities:  Discussions with consultants, evaluation of patient's response to treatment, examination of patient, ordering and performing treatments and interventions, ordering and  review of laboratory studies, pulse oximetry, re-evaluation of patient's condition, obtaining history from patient or surrogate and review of old charts   Care discussed with: admitting provider     Medications Ordered in ED Medications  0.9 %  sodium chloride infusion (Manually program via Guardrails IV Fluids) (has no administration in time range)    ED Course  I have reviewed the triage vital signs and the nursing notes.  Pertinent labs & imaging results that were available during my care of the patient were reviewed by me and considered in my medical decision making (see chart for details).    MDM Rules/Calculators/A&P                          Patient with history of diverticular bleeding presents with recurrent episodes since Friday.  Patient clinically anemic pale on exam, borderline tachycardia and gross blood per rectal exam with no obvious external hemorrhoids.  Reviewed medical records 2019 her last admission discharge for pneumonia, symptomatic anemia where she received 1 unit of packed red blood cells.  Fortunately patient's blood pressure stable at this time however heart rate elevated to 98.  With significant symptoms 2 units of packed red blood cells ordered, type and screen.  Remainder of other blood work overall at patient baseline no acute changes.  Hemoglobin 6.4, Hemoccult positive.  Paged gastroenterology on-call for [facility] and hospitalist for admission.  Updated patient on plan of care. [facility] on call will see pt in am.   Final Clinical Impression(s) / ED Diagnoses Final diagnoses:  Symptomatic anemia  Other fatigue  Acute GI bleeding    Rx / DC Orders ED Discharge Orders     None        Elnora Morrison, MD 12/18/20 1725

## 2020-12-18 NOTE — ED Provider Notes (Signed)
Emergency Medicine Provider Triage Evaluation Note  DVORA BUITRON , a 80 y.o. female  was evaluated in triage.  Pt complains of bright red blood per rectum since Friday.  She states that she has had GI bleed in the past and this is similar.  She thought that it was slowing down however today she had a bowel movement and there was blood.  She denies taking any blood thinners.  No abdominal pain..  Review of Systems  Positive: Bright red blood per rectum Negative: Abdominal pain, blood thinner use  Physical Exam  BP (!) 119/54 (BP Location: Left Arm)   Pulse 79   Temp 98.1 F (36.7 C) (Oral)   Resp 16   SpO2 98%  Gen:   Awake, no distress   Resp:  Normal effort  MSK:   Moves extremities without difficulty  Other:  Awake and alert.   Medical Decision Making  Medically screening exam initiated at 2:39 PM.  Appropriate orders placed.  [facility] Jeff[facility] Allmon was informed that the remainder of the evaluation will be completed by another provider, this initial triage assessment does not replace that evaluation, and the importance of remaining in the ED until their evaluation is complete.     Lorin Glass, PA-C 12/18/20 1440    Elnora Morrison, MD 12/18/20 [phone]

## 2020-12-18 NOTE — H&P (Signed)
History and Physical    CAMIKA MARSICO TWS:[id number] DOB: 04-01-1941 DOA: 12/18/2020  PCP: [name], MD (Confirm with patient/family/NH records and if not entered, this has to be entered at [facility] point of entry) Patient coming from: Home  I have personally briefly reviewed patient's old medical records in [place]  Chief Complaint: Rectal bleed  HPI: [name] is a 80 y.o. 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  Patient started to have episode of rectal bleed since last Friday with bright red blood per rectum, 2-3 times a day, small to moderate amount, denies any abdominal pain, no chest pain or shortness of breath, no lightheadedness or palpitations.  No recent rectal bleeding history, last episode of rectal bleed was 4 years ago.  ED Course: Hemoglobin 6.4,   Review of Systems: As per HPI otherwise 14 point review of systems negative.    Past Medical History:  Diagnosis Date   Anxiety    Arthritis    "bilateral knees, shoulders, elbows; neck, pretty widespread" (09/05/2017)   BPPV (benign paroxysmal positional vertigo)    Depression    GERD (gastroesophageal reflux disease)    Glaucoma, both eyes    Headache    "probably 2/month" (09/05/2017)   History of blood transfusion ~ 2008   "related to LGIB"   Hyperlipemia    Lower GI bleeding ~ 2008; 09/05/2017   "had to have blood transfusion"   Macular degeneration, bilateral    Osteopenia    Seasonal allergies    Skin cancer, basal cell 2001   "off my nose, left side"   Sleeping excessive    Tinnitus of both ears     Past Surgical History:  Procedure Laterality Date   BASAL CELL CARCINOMA EXCISION  2001   "off my nose, left side"   BLEPHAROPLASTY Bilateral    CATARACT EXTRACTION W/ INTRAOCULAR LENS  IMPLANT, BILATERAL Bilateral 1990's   EYE SURGERY Bilateral     "to improve vision after cataract OR"   JOINT REPLACEMENT     STAPEDES SURGERY Left    "scraped stapedes because it was sticking when it wasn't suppose to"   [place] Left ~ 2008   TOTAL KNEE ARTHROPLASTY  12/20/2011   Procedure: TOTAL KNEE ARTHROPLASTY;  Surgeon: Augustin Schooling, MD;  Location: [facility];  Service: Orthopedics;  Laterality: Right;  Right Total Knee Arthroplasty   TUBAL LIGATION  1980's     reports that she quit smoking about 28 years ago. She has a 27.75 pack-year smoking history. She has never used smokeless tobacco. She reports current alcohol use of about 7.0 standard drinks of alcohol per week. She reports that she does not use drugs.  Allergies  Allergen Reactions   Morphine And Related Itching   Cefadroxil Hives    Patient can take amoxicillin and cipro    Family History  Problem Relation Age of Onset   Heart attack Mother    Heart disease Father    Heart failure Father    Bladder Cancer Father    Supraventricular tachycardia Sister    Hypertension Sister      Prior to Admission medications   Medication Sig Start Date End Date Taking? Authorizing Provider  acetaminophen (TYLENOL) 500 MG tablet Take 1,000 mg by mouth daily as needed for headache.   Yes [provider]  albuterol (PROVENTIL HFA;VENTOLIN HFA) 108 (90 Base) MCG/ACT inhaler Inhale 2 puffs into the lungs daily as needed. For seasonal allergies . Use 2 puffs 3 times daily x 4 days then back to home regimen. 08/10/17   Eugenie Filler, MD  ALPRAZolam Duanne Moron) 0.25 MG tablet Take 1 tablet (0.25 mg total) by mouth daily as needed for anxiety. 09/14/17   Aline August, MD  cholecalciferol (VITAMIN D) 1000 UNITS tablet Take 2,000 Units by mouth 2 (two) times daily.     [provider]  diclofenac sodium (VOLTAREN) 1 % GEL Apply 4 g topically 4 (four) times daily. 08/26/18   Deno Etienne, DO  dorzolamide-timolol (COSOPT) 22.3-6.8  MG/ML ophthalmic solution Place 1 drop into both eyes 2 (two) times daily.    [provider]  DULoxetine (CYMBALTA) 30 MG capsule  02/18/18   [provider]  guaiFENesin (MUCINEX) 600 MG 12 hr tablet Take 600 mg by mouth 2 (two) times daily.    [provider]  latanoprost (XALATAN) 0.005 % ophthalmic solution  02/13/18   [provider]  loratadine (CLARITIN) 10 MG tablet Take 1 tablet (10 mg total) by mouth daily. 08/11/17   Eugenie Filler, MD  Multiple Vitamin (MULTIVITAMIN WITH MINERALS) TABS Take 1 tablet by mouth daily.    [provider]  Multiple Vitamins-Minerals (ICAPS AREDS 2) CAPS Take 1 capsule by mouth 2 (two) times daily.    [provider]  nystatin (MYCOSTATIN/NYSTOP) powder  02/17/18   [provider]  omeprazole (PRILOSEC) 20 MG capsule Take 20 mg by mouth daily.     [provider]  OXYGEN 2lpm with sleep only  [facility]    [provider]  polyethylene glycol (MIRALAX / GLYCOLAX) packet Take 17 g by mouth daily.    [provider]  propranolol (INDERAL) 20 MG tablet Take 20 mg by mouth daily.    [provider]  sertraline (ZOLOFT) 100 MG tablet Take 100 mg by mouth daily.    [provider]  simvastatin (ZOCOR) 20 MG tablet Take 20 mg by mouth daily.    [provider]    Physical Exam: Vitals:   12/18/20 1705 12/18/20 1730 12/18/20 1741 12/18/20 1757  BP: 129/61 (!) 126/57 (!) 126/51 126/61  Pulse: 79 74 75 78  Resp: 18 (!) 22 16 16   Temp:   98 F (36.7 C) 97.9 F (36.6 C)  TempSrc:   Temporal Temporal  SpO2: 98% 97%  100%    Constitutional: NAD, calm, comfortable Vitals:   12/18/20 1705 12/18/20 1730 12/18/20 1741 12/18/20 1757  BP: 129/61 (!) 126/57 (!) 126/51 126/61  Pulse: 79 74 75 78  Resp: 18 (!) 22 16 16   Temp:   98 F (36.7 C) 97.9 F (36.6 C)  TempSrc:   Temporal Temporal  SpO2: 98% 97%  100%   Eyes: PERRL, lids and conjunctivae  pale ENMT: Mucous membranes are moist. Posterior pharynx clear of any exudate or lesions.Normal dentition.  Neck: normal, supple, no masses, no thyromegaly Respiratory: clear to auscultation bilaterally, no wheezing, no crackles. Normal respiratory effort. No accessory muscle use.  Cardiovascular: Regular rate and rhythm, no murmurs / rubs / gallops. No extremity edema. 2+ pedal pulses. No carotid bruits.  Abdomen: no tenderness, no masses palpated. No hepatosplenomegaly. Bowel sounds positive.  Musculoskeletal: no clubbing / cyanosis. No joint deformity upper and lower extremities. Good ROM, no contractures. Normal muscle tone.  Skin: no rashes, lesions, ulcers. No induration  Neurologic: CN 2-12 grossly intact. Sensation intact, DTR normal. Strength 5/5 in all 4.  Psychiatric: Normal judgment and insight. Alert and oriented x 3. Normal mood.     Labs on Admission: I have personally reviewed following labs and imaging studies  CBC: Recent Labs  Lab 12/18/20 1439  WBC 6.5  NEUTROABS 3.7  HGB 6.4*  HCT 21.1*  MCV 89.4  PLT 081   Basic Metabolic Panel: Recent Labs  Lab 12/18/20 1439  NA 138  K 3.9  CL 104  CO2 27  GLUCOSE 125*  BUN 15  CREATININE 0.53  CALCIUM 8.5*   GFR: CrCl cannot be calculated (Unknown ideal weight.). Liver Function Tests: Recent Labs  Lab 12/18/20 1439  AST 19  ALT 12  ALKPHOS 36*  BILITOT 0.9  PROT 5.4*  ALBUMIN 3.2*   No results for input(s): LIPASE, AMYLASE in the last 168 hours. No results for input(s): AMMONIA in the last 168 hours. Coagulation Profile: Recent Labs  Lab 12/18/20 1439  INR 1.2   Cardiac Enzymes: No results for input(s): CKTOTAL, CKMB, CKMBINDEX, TROPONINI in the last 168 hours. BNP (last 3 results) No results for input(s): PROBNP in the last 8760 hours. HbA1C: No results for input(s): HGBA1C in the last 72 hours. CBG: No results for input(s): GLUCAP in the last 168 hours. Lipid Profile: No results for  input(s): CHOL, HDL, LDLCALC, TRIG, CHOLHDL, LDLDIRECT in the last 72 hours. Thyroid Function Tests: No results for input(s): TSH, T4TOTAL, FREET4, T3FREE, THYROIDAB in the last 72 hours. Anemia Panel: No results for input(s): VITAMINB12, FOLATE, FERRITIN, TIBC, IRON, RETICCTPCT in the last 72 hours. Urine analysis:    Component Value Date/Time   COLORURINE YELLOW 09/06/2017 0721   APPEARANCEUR HAZY (A) 09/06/2017 0721   LABSPEC 1.023 09/06/2017 0721   PHURINE 5.0 09/06/2017 0721   GLUCOSEU NEGATIVE 09/06/2017 0721   HGBUR NEGATIVE 09/06/2017 0721   BILIRUBINUR NEGATIVE 09/06/2017 0721   KETONESUR 20 (A) 09/06/2017 0721   PROTEINUR NEGATIVE 09/06/2017 0721   UROBILINOGEN 0.2 10/20/2007 2015   NITRITE POSITIVE (A) 09/06/2017 0721   LEUKOCYTESUR NEGATIVE 09/06/2017 0721    Radiological Exams on Admission: No results found.  EKG: Ordered.  Assessment/Plan Active Problems:   Lower GI bleed  (please populate well all problems here in Problem List. (For example, if patient is on BP meds at home and you resume or decide to hold them, it is a problem that needs to be her. Same for CAD, COPD, HLD and so on)  Lower GI bleed -Most likely secondary to recurrent diverticulosis bleed -PRBC x2, Pete H&H tonight and decide further transfusion. -GI on board will see patient tomorrow. NPO except for meds.  Acute blood loss anemia -Secondary to diverticulosis.,  Check iron study.  HTN -Hold propanolol  COPD -Stable, no symptoms or signs of acute exacerbation.  Anxiety depression -Continue SSRI.  DVT prophylaxis: SCD Code Status: Full Code Family Communication: None at bedside Disposition Plan: Expect more than 2 midnight hospital stay Consults called: [facility] Admission status: Tele admit   Lequita Halt MD [facility] Pager [phone]  12/18/2020, 6:14 PM

## 2020-12-19 ENCOUNTER — Inpatient Hospital Stay (HOSPITAL_COMMUNITY): Payer: Medicare HMO

## 2020-12-19 ENCOUNTER — Encounter (HOSPITAL_COMMUNITY): Payer: Self-pay | Admitting: Internal Medicine

## 2020-12-19 HISTORY — PX: IR ANGIOGRAM SELECTIVE EACH ADDITIONAL VESSEL: IMG667

## 2020-12-19 HISTORY — PX: IR EMBO ART  VEN HEMORR LYMPH EXTRAV  INC GUIDE ROADMAPPING: IMG5450

## 2020-12-19 HISTORY — PX: IR ANGIOGRAM FOLLOW UP STUDY: IMG697

## 2020-12-19 HISTORY — PX: IR ANGIOGRAM VISCERAL SELECTIVE: IMG657

## 2020-12-19 HISTORY — PX: IR US GUIDE VASC ACCESS RIGHT: IMG2390

## 2020-12-19 LAB — IRON AND TIBC
Iron: 29 ug/dL (ref 28–170)
Saturation Ratios: 8 % — ABNORMAL LOW (ref 10.4–31.8)
TIBC: 363 ug/dL (ref 250–450)
UIBC: 334 ug/dL

## 2020-12-19 LAB — BASIC METABOLIC PANEL
Anion gap: 7 (ref 5–15)
BUN: 17 mg/dL (ref 8–23)
CO2: 26 mmol/L (ref 22–32)
Calcium: 8.3 mg/dL — ABNORMAL LOW (ref 8.9–10.3)
Chloride: 105 mmol/L (ref 98–111)
Creatinine, Ser: 0.53 mg/dL (ref 0.44–1.00)
GFR, Estimated: >60 mL/min (ref >60)
Glucose, Bld: 111 mg/dL — ABNORMAL HIGH (ref 70–99)
Potassium: 3.5 mmol/L (ref 3.5–5.1)
Sodium: 138 mmol/L (ref 135–145)

## 2020-12-19 LAB — CBC
HCT: 26.6 % — ABNORMAL LOW (ref 36.0–46.0)
Hemoglobin: 8.7 g/dL — ABNORMAL LOW (ref 12.0–15.0)
MCH: 28 pg (ref 26.0–34.0)
MCHC: 32.7 g/dL (ref 30.0–36.0)
MCV: 85.5 fL (ref 80.0–100.0)
Platelets: 132 10*3/uL — ABNORMAL LOW (ref 150–400)
RBC: 3.11 MIL/uL — ABNORMAL LOW (ref 3.87–5.11)
RDW: 15.7 % — ABNORMAL HIGH (ref 11.5–15.5)
WBC: 8.6 10*3/uL (ref 4.0–10.5)
nRBC: 0 % (ref 0.0–0.2)

## 2020-12-19 LAB — HEMOGLOBIN AND HEMATOCRIT, BLOOD
HCT: 20.2 % — ABNORMAL LOW (ref 36.0–46.0)
Hemoglobin: 6.5 g/dL — CL (ref 12.0–15.0)

## 2020-12-19 LAB — RETICULOCYTES
Immature Retic Fract: 25.9 % — ABNORMAL HIGH (ref 2.3–15.9)
RBC.: 3.18 MIL/uL — ABNORMAL LOW (ref 3.87–5.11)
Retic Count, Absolute: 72.5 10*3/uL (ref 19.0–186.0)
Retic Ct Pct: 2.3 % (ref 0.4–3.1)

## 2020-12-19 LAB — PREPARE RBC (CROSSMATCH)

## 2020-12-19 MED ORDER — FENTANYL CITRATE (PF) 100 MCG/2ML IJ SOLN
INTRAMUSCULAR | Status: AC | PRN
  Administered 2020-12-19: 25 ug via INTRAVENOUS

## 2020-12-19 MED ORDER — SODIUM CHLORIDE 0.9% IV SOLUTION: Freq: Once | INTRAVENOUS | Status: AC

## 2020-12-19 MED ORDER — IOHEXOL 350 MG/ML SOLN
100.0000 mL | Freq: Once | INTRAVENOUS | Status: AC | PRN
  Administered 2020-12-19: 100 mL via INTRAVENOUS

## 2020-12-19 MED ORDER — MIDAZOLAM HCL 2 MG/2ML IJ SOLN
INTRAMUSCULAR | Status: AC
  Filled 2020-12-19: qty 2

## 2020-12-19 MED ORDER — LIDOCAINE HCL (PF) 1 % IJ SOLN
INTRAMUSCULAR | Status: AC | PRN
  Administered 2020-12-19: 10 mL

## 2020-12-19 MED ORDER — SODIUM CHLORIDE 0.9 % IV SOLN: INTRAVENOUS | Status: AC

## 2020-12-19 MED ORDER — SODIUM CHLORIDE 0.9 % IV SOLN: INTRAVENOUS | Status: DC

## 2020-12-19 MED ORDER — FENTANYL CITRATE (PF) 100 MCG/2ML IJ SOLN
INTRAMUSCULAR | Status: AC
  Filled 2020-12-19: qty 2

## 2020-12-19 MED ORDER — LIDOCAINE HCL 1 % IJ SOLN
INTRAMUSCULAR | Status: AC
  Filled 2020-12-19: qty 20

## 2020-12-19 MED ORDER — SODIUM CHLORIDE 0.9 % IV SOLN
250.0000 mg | Freq: Once | INTRAVENOUS | Status: AC
  Administered 2020-12-19: 250 mg via INTRAVENOUS
  Filled 2020-12-19: qty 20

## 2020-12-19 MED ORDER — MIDAZOLAM HCL 2 MG/2ML IJ SOLN
INTRAMUSCULAR | Status: AC | PRN
  Administered 2020-12-19: 1 mg via INTRAVENOUS

## 2020-12-19 NOTE — Plan of Care (Signed)

## 2020-12-19 NOTE — Procedures (Signed)
Pre-procedure Diagnosis: Acute lower GI bleeding Post-procedure Diagnosis: Same  Post mesenteric arteriogram and percutaneous embolization of a short segment of the marginal artery supplying ill defined area of extravasation within the splenic flexure of the colon.   Complications: None Immediate  EBL: None  Keep right leg straight for 4 hrs (until 1730)  Signed: Sandi Mariscal Pager: [phone] 12/19/2020, 1:51 PM

## 2020-12-19 NOTE — H&P (Signed)
Chief Complaint: Patient was seen in consultation today for mesenteric arteriogram and possible embolization  Chief Complaint  Patient presents with   Rectal Bleeding   at the request of  Dr. Cristina Gong, Robert./ Dr. Sabino Gasser, D.   Referring Physician(s): Dr. Cristina Gong, R. /Dr. Sabino Gasser, D.   Supervising Physician: Sandi Mariscal  Patient Status: [facility] - In-pt  History of Present Illness: [name] is a 80 y.o. female with PMH of COPD, pneumonia, hyperlipidemia, GERD, arthritis, anxiety, BPPV, osteopenia, history of lower GI bleeding with blood transfusion who presented to [facility] ED on 12/18/20 with chief complaint of rectal bleeding and hematochezia x 3 days.  Her vital signs were stable with no hypotension, tachycardia, or tachypnea, however, CBC showed hemoglobin of 6.4.  Patient received 2 PRBC which brought hgb  up to 8.7. Patient was admitted and GI was consulted for further evaluation. Patient underwent CT angiogram on 12/19/2020 per GI which showed:   1. Intraluminal arterial contrast blush, localizing diverticular hemorrhage to the distal transverse colon just proximal to the splenic flexure.  2.  Pancolonic diverticulosis.  3.  Aortic atherosclerosis.  IR was requested for a mesenteric arteriogram with possible embolization.  Patient laying in bed, not in acute distress.  Reports mild n/v due to dehydration/ being nervous. Reports rectal bleeding this morning.  Denise headache, fever, chills, shortness of breath, cough, chest pain, abdominal pain.    Past Medical History:  Diagnosis Date   Anxiety    Arthritis    "bilateral knees, shoulders, elbows; neck, pretty widespread" (09/05/2017)   BPPV (benign paroxysmal positional vertigo)    Depression    GERD (gastroesophageal reflux disease)    Glaucoma, both eyes    Headache    "probably 2/month" (09/05/2017)   History of blood transfusion ~ 2008   "related to LGIB"   Hyperlipemia    Lower GI bleeding ~ 2008; 09/05/2017   "had  to have blood transfusion"   Macular degeneration, bilateral    Osteopenia    Seasonal allergies    Skin cancer, basal cell 2001   "off my nose, left side"   Sleeping excessive    Tinnitus of both ears     Past Surgical History:  Procedure Laterality Date   BASAL CELL CARCINOMA EXCISION  2001   "off my nose, left side"   BLEPHAROPLASTY Bilateral    CATARACT EXTRACTION W/ INTRAOCULAR LENS  IMPLANT, BILATERAL Bilateral 1990's   EYE SURGERY Bilateral    "to improve vision after cataract OR"   JOINT REPLACEMENT     STAPEDES SURGERY Left    "scraped stapedes because it was sticking when it wasn't suppose to"   [place] Left ~ 2008   TOTAL KNEE ARTHROPLASTY  12/20/2011   Procedure: TOTAL KNEE ARTHROPLASTY;  Surgeon: Augustin Schooling, MD;  Location: [place];  Service: Orthopedics;  Laterality: Right;  Right Total Knee Arthroplasty   TUBAL LIGATION  1980's    Allergies: Morphine and related, Abaloparatide, Aspirin, Duloxetine hcl, and Cefadroxil  Medications: Prior to Admission medications   Medication Sig Start Date End Date Taking? Authorizing Provider  acetaminophen (TYLENOL) 500 MG tablet Take 1,000 mg by mouth daily as needed for headache.   Yes [provider]  albuterol (PROVENTIL HFA;VENTOLIN HFA) 108 (90 Base) MCG/ACT inhaler Inhale 2 puffs into the lungs daily as needed. For seasonal allergies . Use 2 puffs 3 times daily x 4 days then back to home regimen. Patient  taking differently: Inhale 2 puffs into the lungs 2 (two) times a week. 08/10/17  Yes Eugenie Filler, MD  ALPRAZolam Duanne Moron) 0.25 MG tablet Take 1 tablet (0.25 mg total) by mouth daily as needed for anxiety. 09/14/17  Yes Aline August, MD  cholecalciferol (VITAMIN D) 1000 UNITS tablet Take 2,000 Units by mouth 2 (two) times daily.    Yes [provider]  diclofenac sodium (VOLTAREN) 1 % GEL Apply 4 g topically 4 (four) times daily. Patient  taking differently: Apply 4 g topically 3 (three) times a week. 08/26/18  Yes Deno Etienne, DO  dorzolamide-timolol (COSOPT) 22.3-6.8 MG/ML ophthalmic solution Place 1 drop into both eyes 2 (two) times daily.   Yes [provider]  latanoprost (XALATAN) 0.005 % ophthalmic solution Place 1 drop into both eyes at bedtime. 02/13/18  Yes [provider]  loratadine (CLARITIN) 10 MG tablet Take 1 tablet (10 mg total) by mouth daily. 08/11/17  Yes Eugenie Filler, MD  Multiple Vitamin (MULTIVITAMIN WITH MINERALS) TABS Take 1 tablet by mouth daily.   Yes [provider]  Multiple Vitamins-Minerals (ICAPS AREDS 2) CAPS Take 1 capsule by mouth 2 (two) times daily.   Yes [provider]  omeprazole (PRILOSEC) 20 MG capsule Take 20 mg by mouth every evening.   Yes [provider]  OXYGEN 2lpm with sleep only  [employer]   Yes [provider]  polyethylene glycol (MIRALAX / GLYCOLAX) packet Take 17 g by mouth daily.   Yes [provider]  propranolol (INDERAL) 20 MG tablet Take 20 mg by mouth in the morning.   Yes [provider]  sertraline (ZOLOFT) 100 MG tablet Take 150 mg by mouth in the morning.   Yes [provider]  simvastatin (ZOCOR) 20 MG tablet Take 20 mg by mouth daily at 6 PM.   Yes [provider]  penicillin v potassium (VEETID) 500 MG tablet Take 500 mg by mouth 4 (four) times daily. 12/05/20   [provider]     Family History  Problem Relation Age of Onset   Heart attack Mother    Heart disease Father    Heart failure Father    Bladder Cancer Father    Supraventricular tachycardia Sister    Hypertension Sister     Social History   Socioeconomic History   Marital status: Married    Spouse name: Clair Gulling    Number of children: 3   Years of education: College   Highest education level: Not on file  Occupational History   Occupation: Retired Pharmacist, hospital  Tobacco Use   Smoking status: Former     Packs/day: 0.75    Years: 37.00    Pack years: 27.75    Types: Cigarettes    Quit date: 07/01/1992    Years since quitting: 28.4   Smokeless tobacco: Never  Vaping Use   Vaping Use: Never used  Substance and Sexual Activity   Alcohol use: Yes    Alcohol/week: 7.0 standard drinks    Types: 7 Glasses of wine per week    Comment: 09/05/2017  "6 oz wine, 5 days/wk"   Drug use: No    Types: Hydrocodone   Sexual activity: Not Currently  Other Topics Concern   Not on file  Social History Narrative   1-2 cups of coffee a day    Social Determinants of Health   Financial Resource Strain: Not on file  Food Insecurity: Not on file  Transportation Needs: Not on file  Physical Activity: Not on file  Stress: Not on file  Social Connections: Not on file     Review of Systems: A 12 point ROS discussed and pertinent positives are indicated in the HPI above.  All other systems are negative.   Vital Signs: BP (!) 144/62 (BP Location: Right Arm)   Pulse 90   Temp 97.7 F (36.5 C) (Oral)   Resp 18   Wt 178 lb 5.6 oz (80.9 kg)   SpO2 97%   BMI 32.62 kg/m   Physical Exam Vitals reviewed.  Constitutional:      General: She is not in acute distress.    Appearance: Normal appearance. She is not ill-appearing.  HENT:     Head: Normocephalic and atraumatic.     Mouth/Throat:     Mouth: Mucous membranes are moist.  Cardiovascular:     Rate and Rhythm: Normal rate and regular rhythm.     Pulses: Normal pulses.     Heart sounds: Murmur heard.  Pulmonary:     Effort: Pulmonary effort is normal. No respiratory distress.     Breath sounds: Normal breath sounds.  Abdominal:     General: Abdomen is flat.     Palpations: Abdomen is soft.  Skin:    General: Skin is warm and dry.     Coloration: Skin is not jaundiced or pale.  Neurological:     Mental Status: She is alert and oriented to person, place, and time.  Psychiatric:        Mood and Affect: Mood normal.        Behavior: Behavior  normal.        Judgment: Judgment normal.    MD Evaluation Airway: WNL Heart: WNL Abdomen: WNL Chest/ Lungs: WNL ASA  Classification: 3 Mallampati/Airway Score: Two  Imaging: CT ANGIO GI BLEED  Result Date: 12/19/2020 CLINICAL DATA:  Diverticulosis, recurrent diverticular hemorrhage, continued hematochezia EXAM: CTA ABDOMEN AND PELVIS WITHOUT AND WITH CONTRAST TECHNIQUE: Multidetector CT imaging of the abdomen and pelvis was performed using the standard protocol during bolus administration of intravenous contrast. Multiplanar reconstructed images and MIPs were obtained and reviewed to evaluate the vascular anatomy. CONTRAST:  1106mL OMNIPAQUE IOHEXOL 350 MG/ML SOLN COMPARISON:  CT pelvis, 08/17/2018 FINDINGS: VASCULAR Moderate calcific aortic atherosclerosis. Normal contour and caliber of the abdominal aorta. 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 Otherwise standard branching pattern of the abdominal aorta. Atherosclerosis at the branch vessel origins without significant stenosis. There is an intraluminal arterial contrast blush within the distal transverse colon, just proximal to the splenic flexure, with multiple diverticula in this vicinity (series 7, image 36, series 12, image 66). Review of the MIP images confirms the above findings. NON-VASCULAR Lower chest: No acute abnormality. Hepatobiliary: No focal liver abnormality is seen. No gallstones, gallbladder wall thickening, or biliary dilatation. Pancreas: Unremarkable. No pancreatic ductal dilatation or surrounding inflammatory changes. Spleen: Normal in size without focal abnormality. Adrenals/Urinary Tract: Adrenal glands are unremarkable. Left-sided parapelvic cyst. Kidneys are otherwise normal, without renal calculi, solid lesion, or hydronephrosis. Bladder is unremarkable. Stomach/Bowel: Stomach is  within normal limits. Appendix appears normal. No evidence of bowel wall thickening, distention, or inflammatory changes. Lymphatic: No significant vascular findings are present. No enlarged abdominal or pelvic lymph nodes. Reproductive: Uterus and bilateral adnexa are unremarkable. Other: No abdominal wall hernia or abnormality. No abdominopelvic ascites. Musculoskeletal: No acute or significant  osseous findings. IMPRESSION: 1. Intraluminal arterial contrast blush, localizing diverticular hemorrhage to the distal transverse colon just proximal to the splenic flexure. 2.  Pancolonic diverticulosis. 3.  Aortic atherosclerosis. These results will be called to the ordering clinician or representative by the Radiologist Assistant, and communication documented in the PACS or [employer]. Electronically Signed   By: Eddie Candle M.D.   On: 12/19/2020 10:43    Labs:  CBC: Recent Labs    12/18/20 1439 12/19/20 0144  WBC 6.5 8.6  HGB 6.4* 8.7*  HCT 21.1* 26.6*  PLT 173 132*    COAGS: Recent Labs    12/18/20 1439  INR 1.2    BMP: Recent Labs    12/18/20 1439 12/19/20 0144  NA 138 138  K 3.9 3.5  CL 104 105  CO2 27 26  GLUCOSE 125* 111*  BUN 15 17  CALCIUM 8.5* 8.3*  CREATININE 0.53 0.53  GFRNONAA >60 >60    LIVER FUNCTION TESTS: Recent Labs    12/18/20 1439  BILITOT 0.9  AST 19  ALT 12  ALKPHOS 36*  PROT 5.4*  ALBUMIN 3.2*    TUMOR MARKERS: No results for input(s): AFPTM, CEA, CA199, CHROMGRNA in the last 8760 hours.  Assessment and Plan: 80 y.o. female with lower GI bleed with hemoglobin of 6.4, s/p blood transfusion with 2 PRBC.  Patient underwent CT angio abdomen pelvis on 12/19/2020 which showed intraluminal arterial contrast blush, localizing diverticular hemorrhage to the distal transverse colon just proximal to the splenic flexure.  IR was requested for a mesenteric angiogram with possible embolization. Case was reviewed and approved by Dr. Pascal Lux. Patient  was brought down to [facility] IR today for the procedure. N.p.o. since midnight VS with hypertension, tachycardia or tachypnea.  O2 sat 97 on room air.  CBC Hgb 8.7, HCT 26.6, platelet 132 RF BUN 17, creatinine 0.53, GFR greater than 60  Risks and benefits of mesenteric arteriogram and possible embolization were discussed with the patient including, but not limited to bleeding, infection, vascular injury or contrast induced renal failure.  This interventional procedure involves the use of X-rays and because of the nature of the planned procedure, it is possible that we will have prolonged use of X-ray fluoroscopy.  Potential radiation risks to you include (but are not limited to) the following: - A slightly elevated risk for cancer  several years later in life. This risk is typically less than 0.5% percent. This risk is low in comparison to the normal incidence of human cancer, which is 33% for women and 50% for men according to the [place]. - Radiation induced injury can include skin redness, resembling a rash, tissue breakdown / ulcers and hair loss (which can be temporary or permanent).   The likelihood of either of these occurring depends on the difficulty of the procedure and whether you are sensitive to radiation due to previous procedures, disease, or genetic conditions.   IF your procedure requires a prolonged use of radiation, you will be notified and given written instructions for further action.  It is your responsibility to monitor the irradiated area for the 2 weeks following the procedure and to notify your physician if you are concerned that you have suffered a radiation induced injury.    All of the patient's questions were answered, patient is agreeable to proceed.  Consent signed and in chart.   Thank you for this interesting consult.  I greatly enjoyed meeting [name] and look forward to participating  in their care.  A copy of this report was sent to the  requesting provider on this date.  Electronically Signed: Tera Mater, PA-C 12/19/2020, 12:06 PM   I spent a total of 40 Minutes   in face to face in clinical consultation, greater than 50% of which was counseling/coordinating care for mesenteric arteriogram with possible embolization.

## 2020-12-19 NOTE — Consult Note (Signed)
Referring Provider: No ref. provider found Primary Care Physician:  [name], MD Primary Gastroenterologist:  [facility] (former patient Dr. Leonie Douglas)  Reason for Consultation: GI bleeding  HPI: [name] is a 80 y.o. female admitted through the emergency room yesterday with a 3-day history of recurrent hematochezia, couple of times a day, without abdominal pain or syncope.  Known history of diverticulosis and recurrent diverticular hemorrhage, most recently simply about 4 years ago.  Medical illnesses include nocturnal oxygen dependent COPD.  Overnight, the patient received 2 units of packed red cells, with her hemoglobin showing an appropriate initial rise, from 6.4 at time of admission (baseline 3 years ago was 13.1) to a level of 8.7 at 2:00 this morning.  The patient continues to pass blood.  In fact, she passed approximately 200 mL of liquid red burgundy blood, during the brief time that I was with her.   Past Medical History:  Diagnosis Date   Anxiety    Arthritis    "bilateral knees, shoulders, elbows; neck, pretty widespread" (09/05/2017)   BPPV (benign paroxysmal positional vertigo)    Depression    GERD (gastroesophageal reflux disease)    Glaucoma, both eyes    Headache    "probably 2/month" (09/05/2017)   History of blood transfusion ~ 2008   "related to LGIB"   Hyperlipemia    Lower GI bleeding ~ 2008; 09/05/2017   "had to have blood transfusion"   Macular degeneration, bilateral    Osteopenia    Seasonal allergies    Skin cancer, basal cell 2001   "off my nose, left side"   Sleeping excessive    Tinnitus of both ears     Past Surgical History:  Procedure Laterality Date   BASAL CELL CARCINOMA EXCISION  2001   "off my nose, left side"   BLEPHAROPLASTY Bilateral    CATARACT EXTRACTION W/ INTRAOCULAR LENS  IMPLANT, BILATERAL Bilateral 1990's   EYE SURGERY Bilateral    "to improve vision after cataract OR"   JOINT REPLACEMENT     STAPEDES  SURGERY Left    "scraped stapedes because it was sticking when it wasn't suppose to"   [place] Left ~ 2008   TOTAL KNEE ARTHROPLASTY  12/20/2011   Procedure: TOTAL KNEE ARTHROPLASTY;  Surgeon: Augustin Schooling, MD;  Location: [facility];  Service: Orthopedics;  Laterality: Right;  Right Total Knee Arthroplasty   TUBAL LIGATION  1980's    Prior to Admission medications   Medication Sig Start Date End Date Taking? Authorizing Provider  acetaminophen (TYLENOL) 500 MG tablet Take 1,000 mg by mouth daily as needed for headache.   Yes [provider]  albuterol (PROVENTIL HFA;VENTOLIN HFA) 108 (90 Base) MCG/ACT inhaler Inhale 2 puffs into the lungs daily as needed. For seasonal allergies . Use 2 puffs 3 times daily x 4 days then back to home regimen. Patient taking differently: Inhale 2 puffs into the lungs 2 (two) times a week. 08/10/17  Yes Eugenie Filler, MD  ALPRAZolam Duanne Moron) 0.25 MG tablet Take 1 tablet (0.25 mg total) by mouth daily as needed for anxiety. 09/14/17  Yes Aline August, MD  cholecalciferol (VITAMIN D) 1000 UNITS tablet Take 2,000 Units by mouth 2 (two) times daily.    Yes [provider]  diclofenac sodium (VOLTAREN) 1 % GEL Apply 4 g topically 4 (four) times daily. Patient taking differently: Apply 4 g topically 3 (three) times a week. 08/26/18  Yes Deno Etienne, DO  dorzolamide-timolol (COSOPT) 22.3-6.8 MG/ML ophthalmic solution Place 1 drop into both eyes 2 (two) times daily.   Yes [provider]  latanoprost (XALATAN) 0.005 % ophthalmic solution Place 1 drop into both eyes at bedtime. 02/13/18  Yes [provider]  loratadine (CLARITIN) 10 MG tablet Take 1 tablet (10 mg total) by mouth daily. 08/11/17  Yes Eugenie Filler, MD  Multiple Vitamin (MULTIVITAMIN WITH MINERALS) TABS Take 1 tablet by mouth daily.   Yes [provider]  Multiple Vitamins-Minerals (ICAPS AREDS 2) CAPS Take  1 capsule by mouth 2 (two) times daily.   Yes [provider]  omeprazole (PRILOSEC) 20 MG capsule Take 20 mg by mouth every evening.   Yes [provider]  OXYGEN 2lpm with sleep only  [employer]   Yes [provider]  polyethylene glycol (MIRALAX / GLYCOLAX) packet Take 17 g by mouth daily.   Yes [provider]  propranolol (INDERAL) 20 MG tablet Take 20 mg by mouth in the morning.   Yes [provider]  sertraline (ZOLOFT) 100 MG tablet Take 150 mg by mouth in the morning.   Yes [provider]  simvastatin (ZOCOR) 20 MG tablet Take 20 mg by mouth daily at 6 PM.   Yes [provider]  penicillin v potassium (VEETID) 500 MG tablet Take 500 mg by mouth 4 (four) times daily. 12/05/20   [provider]    Current Facility-Administered Medications  Medication Dose Route Frequency Provider Last Rate Last Admin   0.9 %  sodium chloride infusion   Intravenous Continuous Clytie Shetley, MD       acetaminophen (TYLENOL) tablet 1,000 mg  1,000 mg Oral Daily PRN Wynetta Fines T, MD       albuterol (PROVENTIL) (2.5 MG/3ML) 0.083% nebulizer solution 2.5 mg  2.5 mg Inhalation Daily PRN Lequita Halt, MD       ALPRAZolam Duanne Moron) tablet 0.25 mg  0.25 mg Oral Daily PRN Lequita Halt, MD       cholecalciferol (VITAMIN D3) tablet 2,000 Units  2,000 Units Oral BID Wynetta Fines T, MD   2,000 Units at 12/18/20 2227   diclofenac Sodium (VOLTAREN) 1 % topical gel 2 g  2 g Topical QID Wynetta Fines T, MD   2 g at 12/19/20 0829   dorzolamide-timolol (COSOPT) 22.3-6.8 MG/ML ophthalmic solution 1 drop  1 drop Both Eyes BID Wynetta Fines T, MD       DULoxetine (CYMBALTA) DR capsule 20 mg  20 mg Oral Daily Wynetta Fines T, MD   20 mg at 12/18/20 2057   ferric gluconate (FERRLECIT) 250 mg in sodium chloride 0.9 % 250 mL IVPB  250 mg Intravenous Once Dwyane Dee, MD       guaiFENesin (MUCINEX) 12 hr tablet 600 mg  600 mg Oral BID Wynetta Fines T, MD        latanoprost (XALATAN) 0.005 % ophthalmic solution 1 drop  1 drop Both Eyes QHS Wynetta Fines T, MD   1 drop at 12/18/20 2253   loratadine (CLARITIN) tablet 10 mg  10 mg Oral Daily Wynetta Fines T, MD       multivitamin (PROSIGHT) tablet 1 tablet  1 tablet Oral BID Lequita Halt, MD   1 tablet at 12/18/20 2227   multivitamin with minerals tablet 1 tablet  1 tablet Oral Daily Wynetta Fines T, MD       pantoprazole (PROTONIX) EC tablet 40 mg  40  mg Oral Daily Wynetta Fines T, MD       polyethylene glycol [facility] / GLYCOLAX) packet 17 g  17 g Oral Daily Wynetta Fines T, MD       sertraline (ZOLOFT) tablet 100 mg  100 mg Oral Daily Wynetta Fines T, MD   100 mg at 12/18/20 2057   simvastatin (ZOCOR) tablet 20 mg  20 mg Oral Daily Lequita Halt, MD        Allergies as of 12/18/2020 - Review Complete 12/18/2020  Allergen Reaction Noted   Morphine and related Itching 11/29/2011   Abaloparatide Other (See Comments) 10/22/2020   Aspirin Other (See Comments) 10/22/2020   Duloxetine hcl Other (See Comments) 10/22/2020   Cefadroxil Hives 12/11/2011    Family History  Problem Relation Age of Onset   Heart attack Mother    Heart disease Father    Heart failure Father    Bladder Cancer Father    Supraventricular tachycardia Sister    Hypertension Sister     Social History   Socioeconomic History   Marital status: Married    Spouse name: Clair Gulling    Number of children: 3   Years of education: College   Highest education level: Not on file  Occupational History   Occupation: Retired Pharmacist, hospital  Tobacco Use   Smoking status: Former    Packs/day: 0.75    Years: 37.00    Pack years: 27.75    Types: Cigarettes    Quit date: 07/01/1992    Years since quitting: 28.4   Smokeless tobacco: Never  Vaping Use   Vaping Use: Never used  Substance and Sexual Activity   Alcohol use: Yes    Alcohol/week: 7.0 standard drinks    Types: 7 Glasses of wine per week    Comment: 09/05/2017  "6 oz wine, 5 days/wk"   Drug use:  No    Types: Hydrocodone   Sexual activity: Not Currently  Other Topics Concern   Not on file  Social History Narrative   1-2 cups of coffee a day    Social Determinants of Health   Financial Resource Strain: Not on file  Food Insecurity: Not on file  Transportation Needs: Not on file  Physical Activity: Not on file  Stress: Not on file  Social Connections: Not on file  Intimate Partner Violence: Not on file    Review of Systems: See HPI  Physical Exam: Vital signs in last 24 hours: Temp:  [97.8 F (36.6 C)-98.1 F (36.7 C)] 98 F (36.7 C) (06/21 0411) Pulse Rate:  [70-98] 70 (06/21 0411) Resp:  [14-33] 16 (06/21 0411) BP: (103-150)/(51-91) 106/59 (06/21 0411) SpO2:  [95 %-100 %] 100 % (06/21 0411) Weight:  [80.9 kg] 80.9 kg (06/21 0104) Last BM Date: 12/19/20  Pleasant, fully alert and awake, elderly Caucasian female lying flat in bed in no distress.  She is very pale.  She is anicteric.  Conjunctivae are very pale.  Skin is warm and dry, well perfused.  Radial pulses reasonably full.  Chest clear, heart without murmur or arrhythmia, abdomen mildly adipose, but without mass-effect or tenderness.  Lower extremities without edema.  No obvious focal neurologic deficits.  Mood normal, actually quite spry.   Intake/Output from previous day: 06/20 0701 - 06/21 0700 In: 822 [Blood:822] Out: -  Intake/Output this shift: No intake/output data recorded.  Lab Results: Recent Labs    12/18/20 1439 12/19/20 0144  WBC 6.5 8.6  HGB 6.4* 8.7*  HCT 21.1* 26.6*  PLT 173 132*   BMET Recent Labs    12/18/20 1439 12/19/20 0144  NA 138 138  K 3.9 3.5  CL 104 105  CO2 27 26  GLUCOSE 125* 111*  BUN 15 17  CREATININE 0.53 0.53  CALCIUM 8.5* 8.3*   LFT Recent Labs    12/18/20 1439  PROT 5.4*  ALBUMIN 3.2*  AST 19  ALT 12  ALKPHOS 36*  BILITOT 0.9   PT/INR Recent Labs    12/18/20 1439  LABPROT 15.0  INR 1.2    Studies/Results: No results  found.  Impression:  1.  Active lower GI bleeding, very consistent with history of recurrent diverticular hemorrhage based on absence of pain and persistent, profuse passage of blood.  2.  Posthemorrhagic anemia, acute, severe, partially corrected by transfusion  3.  Underlying COPD, on home oxygen at nighttime  Plan:  Stat CT angiogram to see if we can identify the precise source of bleeding, in which case interventional radiology might be able to assist with embolization. IV fluids.  Have discussed management with her attending physician, Dr. Sabino Gasser.  She is currently n.p.o. and not getting any IV fluids and I think that needs to be corrected, especially in anticipation of a dye load from her CT angiogram and with active bleeding Close monitoring of labs with transfusion as needed to keep hemoglobin above 8 in view of her advanced age and COPD and active bleeding There is probably little benefit to colonoscopy in the acute setting of a patient with known diverticulosis and no reason to suspect alternative etiologies of blood loss.  We will continue to follow with you, however.    LOS: 1 day   Youlanda Mighty Marius Betts  12/19/2020, 8:47 AM   Pager [phone] If no answer or after 5 PM call [phone]

## 2020-12-19 NOTE — Sedation Documentation (Signed)
Patient transported back to 6N. Tameika LPN at the bedside. Groin site assessed. Clean, dry and intact. Soft to palpation, no hematoma noted. Distal pulses intact via doppler

## 2020-12-19 NOTE — Progress Notes (Signed)
Progress Note    [name]   LFY:[id number]  DOB: 1940/12/07  DOA: 12/18/2020     1  PCP: [name], MD  CC: rectal bleeding  Hospital Course: [name] is a 80 y.o. 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  She reported intermittent rectal bleeding since Friday.  This morning she had ongoing rectal bleeding when seen in the bed.  Hemoglobin 6.4 g/dL on admission and she was transfused 2 units PRBC.  Iron stores also low and she was ordered for iron transfusion.  She underwent CT angio GI bleed scan which showed an intraluminal arterial blush consistent with diverticular hemorrhage located in the distal transverse colon just proximal to the splenic flexure.  Radiology was consulted for embolization.    Interval History:  Seen in bed this morning.  Was having ongoing rectal bleeding and getting cleaned up.  Denied any dizziness, lightheadedness.  ROS: Constitutional: negative for chills and fevers, Respiratory: negative for cough, Cardiovascular: negative for chest pain, and Gastrointestinal: negative for abdominal pain  Assessment & Plan:  Lower GI bleed - Patient presented with rectal bleeding since Friday.  Hemoglobin 6.4 g/dL on admission -Given 2 units PRBC and iron infusion also ordered - GI consulted on admission as well and following - Due to ongoing bleeding, patient underwent CT angio GI bleed scan which showed diverticular bleed.  IR consulted on 12/19/2020 for embolization -Continue fluids and trending H/H  Hypertension - Propranolol on hold  COPD - No signs/symptoms of exacerbation  Anxiety/depression - Continue Xanax and Zoloft    Old records reviewed in assessment of this patient  Antimicrobials:   DVT prophylaxis: SCDs Start: 12/18/20 1813   Code Status:   Code Status: Full Code Family  Communication:   Disposition Plan: Status is: Inpatient  Remains inpatient appropriate because:Hemodynamically unstable, IV treatments appropriate due to intensity of illness or inability to take PO, Inpatient level of care appropriate due to severity of illness, and GIB  Dispo: The patient is from: Home              Anticipated d/c is to: Home              Patient currently is not medically stable to d/c.   Difficult to place patient No      Risk of unplanned readmission score: Unplanned Admission- Pilot do not use: 15.19   Objective: Blood pressure (!) 130/58, pulse 87, temperature 97.7 F (36.5 C), temperature source Oral, resp. rate 18, weight 80.9 kg, SpO2 96 %.  Examination: General appearance: alert, cooperative, and no distress Head: Normocephalic, without obvious abnormality, atraumatic Eyes:  EOMI Lungs: clear to auscultation bilaterally Heart: regular rate and rhythm and S1, S2 normal Abdomen: normal findings: bowel sounds normal and soft, non-tender Extremities:  no edema Skin: mobility and turgor normal Neurologic: Grossly normal  Consultants:  GI IR  Procedures:  IR embolization: 12/19/20  Data Reviewed: I have personally reviewed following labs and imaging studies Results for orders placed or performed during the hospital encounter of 12/18/20 (from the past 24 hour(s))  Comprehensive metabolic panel     Status: Abnormal   Collection Time: 12/18/20  2:39 PM  Result Value Ref Range   Sodium 138 135 - 145 mmol/L   Potassium 3.9 3.5 - 5.1 mmol/L   Chloride 104 98 - 111 mmol/L   CO2 27 22 -  32 mmol/L   Glucose, Bld 125 (H) 70 - 99 mg/dL   BUN 15 8 - 23 mg/dL   Creatinine, Ser 0.53 0.44 - 1.00 mg/dL   Calcium 8.5 (L) 8.9 - 10.3 mg/dL   Total Protein 5.4 (L) 6.5 - 8.1 g/dL   Albumin 3.2 (L) 3.5 - 5.0 g/dL   AST 19 15 - 41 U/L   ALT 12 0 - 44 U/L   Alkaline Phosphatase 36 (L) 38 - 126 U/L   Total Bilirubin 0.9 0.3 - 1.2 mg/dL   GFR, Estimated >60 >60  mL/min   Anion gap 7 5 - 15  CBC WITH DIFFERENTIAL     Status: Abnormal   Collection Time: 12/18/20  2:39 PM  Result Value Ref Range   WBC 6.5 4.0 - 10.5 K/uL   RBC 2.36 (L) 3.87 - 5.11 MIL/uL   Hemoglobin 6.4 (LL) 12.0 - 15.0 g/dL   HCT 21.1 (L) 36.0 - 46.0 %   MCV 89.4 80.0 - 100.0 fL   MCH 27.1 26.0 - 34.0 pg   MCHC 30.3 30.0 - 36.0 g/dL   RDW 16.7 (H) 11.5 - 15.5 %   Platelets 173 150 - 400 K/uL   nRBC 0.0 0.0 - 0.2 %   Neutrophils Relative % 57 %   Neutro Abs 3.7 1.7 - 7.7 K/uL   Lymphocytes Relative 32 %   Lymphs Abs 2.1 0.7 - 4.0 K/uL   Monocytes Relative 8 %   Monocytes Absolute 0.5 0.1 - 1.0 K/uL   Eosinophils Relative 2 %   Eosinophils Absolute 0.1 0.0 - 0.5 K/uL   Basophils Relative 1 %   Basophils Absolute 0.0 0.0 - 0.1 K/uL   Immature Granulocytes 0 %   Abs Immature Granulocytes 0.02 0.00 - 0.07 K/uL  Protime-INR     Status: None   Collection Time: 12/18/20  2:39 PM  Result Value Ref Range   Prothrombin Time 15.0 11.4 - 15.2 seconds   INR 1.2 0.8 - 1.2  Sample to Blood Bank     Status: None   Collection Time: 12/18/20  2:50 PM  Result Value Ref Range   Blood Bank Specimen SAMPLE AVAILABLE FOR TESTING    Sample Expiration      12/19/2020,2359 Performed at [facility], [address], [place], [place] [address]   Type and screen     Status: None   Collection Time: 12/18/20  2:50 PM  Result Value Ref Range   ABO/RH(D) O POS    Antibody Screen NEG    Sample Expiration 12/21/2020,2359    Unit Number [id number]    Blood Component Type RED CELLS,LR    Unit division 00    Status of Unit ISSUED,FINAL    Transfusion Status OK TO TRANSFUSE    Crossmatch Result      Compatible Performed at [facility], [place] [address] [place], [place] [address]    Unit Number [id number]    Blood Component Type RED CELLS,LR    Unit division 00    Status of Unit ISSUED,FINAL    Transfusion Status OK TO TRANSFUSE    Crossmatch Result Compatible   Prepare  RBC (crossmatch)     Status: None   Collection Time: 12/18/20  5:02 PM  Result Value Ref Range   Order Confirmation      ORDER PROCESSED BY BLOOD BANK Performed at [facility], [place] [address] [place], [place] [address]   POC occult blood, ED  Status: Abnormal   Collection Time: 12/18/20  5:04 PM  Result Value Ref Range   Fecal Occult Bld POSITIVE (A) NEGATIVE  SARS CORONAVIRUS 2 (TAT 6-24 HRS) Nasopharyngeal Nasopharyngeal Swab     Status: None   Collection Time: 12/18/20  5:47 PM   Specimen: Nasopharyngeal Swab  Result Value Ref Range   SARS Coronavirus 2 NEGATIVE NEGATIVE  Basic metabolic panel     Status: Abnormal   Collection Time: 12/19/20  1:44 AM  Result Value Ref Range   Sodium 138 135 - 145 mmol/L   Potassium 3.5 3.5 - 5.1 mmol/L   Chloride 105 98 - 111 mmol/L   CO2 26 22 - 32 mmol/L   Glucose, Bld 111 (H) 70 - 99 mg/dL   BUN 17 8 - 23 mg/dL   Creatinine, Ser 0.53 0.44 - 1.00 mg/dL   Calcium 8.3 (L) 8.9 - 10.3 mg/dL   GFR, Estimated >60 >60 mL/min   Anion gap 7 5 - 15  CBC     Status: Abnormal   Collection Time: 12/19/20  1:44 AM  Result Value Ref Range   WBC 8.6 4.0 - 10.5 K/uL   RBC 3.11 (L) 3.87 - 5.11 MIL/uL   Hemoglobin 8.7 (L) 12.0 - 15.0 g/dL   HCT 26.6 (L) 36.0 - 46.0 %   MCV 85.5 80.0 - 100.0 fL   MCH 28.0 26.0 - 34.0 pg   MCHC 32.7 30.0 - 36.0 g/dL   RDW 15.7 (H) 11.5 - 15.5 %   Platelets 132 (L) 150 - 400 K/uL   nRBC 0.0 0.0 - 0.2 %  Iron and TIBC     Status: Abnormal   Collection Time: 12/19/20  1:44 AM  Result Value Ref Range   Iron 29 28 - 170 ug/dL   TIBC 363 250 - 450 ug/dL   Saturation Ratios 8 (L) 10.4 - 31.8 %   UIBC 334 ug/dL  Reticulocytes     Status: Abnormal   Collection Time: 12/19/20  1:44 AM  Result Value Ref Range   Retic Ct Pct 2.3 0.4 - 3.1 %   RBC. 3.18 (L) 3.87 - 5.11 MIL/uL   Retic Count, Absolute 72.5 19.0 - 186.0 K/uL   Immature Retic Fract 25.9 (H) 2.3 - 15.9 %  BLOOD TRANSFUSION REPORT - SCANNED      Status: None   Collection Time: 12/19/20 10:26 AM   Narrative   Ordered by an unspecified provider.    Recent Results (from the past 240 hour(s))  SARS CORONAVIRUS 2 (TAT 6-24 HRS) Nasopharyngeal Nasopharyngeal Swab     Status: None   Collection Time: 12/18/20  5:47 PM   Specimen: Nasopharyngeal Swab  Result Value Ref Range Status   SARS Coronavirus 2 NEGATIVE NEGATIVE Final    Comment: (NOTE) SARS-CoV-2 target nucleic acids are NOT DETECTED.  The SARS-CoV-2 RNA is generally detectable in upper and lower respiratory specimens during the acute phase of infection. Negative results do not preclude SARS-CoV-2 infection, do not rule out co-infections with other pathogens, and should not be used as the sole basis for treatment or other patient management decisions. Negative results must be combined with clinical observations, patient history, and epidemiological information. The expected result is Negative.  Fact Sheet for Patients: SugarRoll.be  Fact Sheet for Healthcare Providers: https://www.woods-mathews.com/  This test is not yet approved or cleared by the [place] FDA and  has been authorized for detection and/or diagnosis of SARS-CoV-2 by FDA under an Emergency Use Authorization (EUA).  This EUA will remain  in effect (meaning this test can be used) for the duration of the COVID-19 declaration under Se ction 564(b)(1) of the Act, 21 U.S.C. section 360bbb-3(b)(1), unless the authorization is terminated or revoked sooner.  Performed at [facility], [place] [address], [place], [place] [address]      Radiology Studies: CT ANGIO GI BLEED  Result Date: 12/19/2020 CLINICAL DATA:  Diverticulosis, recurrent diverticular hemorrhage, continued hematochezia EXAM: CTA ABDOMEN AND PELVIS WITHOUT AND WITH CONTRAST TECHNIQUE: Multidetector CT imaging of the abdomen and pelvis was performed using the standard protocol during bolus  administration of intravenous contrast. Multiplanar reconstructed images and MIPs were obtained and reviewed to evaluate the vascular anatomy. CONTRAST:  197mL OMNIPAQUE IOHEXOL 350 MG/ML SOLN COMPARISON:  CT pelvis, 08/17/2018 FINDINGS: VASCULAR Moderate calcific aortic atherosclerosis. Normal contour and caliber of the abdominal aorta. 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 Otherwise standard branching pattern of the abdominal aorta. Atherosclerosis at the branch vessel origins without significant stenosis. There is an intraluminal arterial contrast blush within the distal transverse colon, just proximal to the splenic flexure, with multiple diverticula in this vicinity (series 7, image 36, series 12, image 66). Review of the MIP images confirms the above findings. NON-VASCULAR Lower chest: No acute abnormality. Hepatobiliary: No focal liver abnormality is seen. No gallstones, gallbladder wall thickening, or biliary dilatation. Pancreas: Unremarkable. No pancreatic ductal dilatation or surrounding inflammatory changes. Spleen: Normal in size without focal abnormality. Adrenals/Urinary Tract: Adrenal glands are unremarkable. Left-sided parapelvic cyst. Kidneys are otherwise normal, without renal calculi, solid lesion, or hydronephrosis. Bladder is unremarkable. Stomach/Bowel: Stomach is within normal limits. Appendix appears normal. No evidence of bowel wall thickening, distention, or inflammatory changes. Lymphatic: No significant vascular findings are present. No enlarged abdominal or pelvic lymph nodes. Reproductive: Uterus and bilateral adnexa are unremarkable. Other: No abdominal wall hernia or abnormality. No abdominopelvic ascites. Musculoskeletal: No acute or significant osseous findings. IMPRESSION: 1. Intraluminal arterial contrast blush, localizing diverticular  hemorrhage to the distal transverse colon just proximal to the splenic flexure. 2.  Pancolonic diverticulosis. 3.  Aortic atherosclerosis. These results will be called to the ordering clinician or representative by the Radiologist Assistant, and communication documented in the PACS or [employer]. Electronically Signed   By: Eddie Candle M.D.   On: 12/19/2020 10:43   CT ANGIO GI BLEED  Final Result    IR EMBO ART  VEN HEMORR LYMPH EXTRAV  INC GUIDE ROADMAPPING    (Results Pending)  IR US Guide Vasc Access Right    (Results Pending)  IR Angiogram Visceral Selective    (Results Pending)  IR Angiogram Selective Each Additional Vessel    (Results Pending)  IR Angiogram Selective Each Additional Vessel    (Results Pending)  IR Angiogram Follow Up Study    (Results Pending)    Scheduled Meds:  cholecalciferol  2,000 Units Oral BID   diclofenac Sodium  2 g Topical QID   dorzolamide-timolol  1 drop Both Eyes BID   DULoxetine  20 mg Oral Daily   fentaNYL       guaiFENesin  600 mg Oral BID   latanoprost  1 drop Both Eyes QHS   lidocaine       loratadine  10 mg Oral Daily   midazolam       multivitamin  1 tablet Oral BID   multivitamin  with minerals  1 tablet Oral Daily   pantoprazole  40 mg Oral Daily   polyethylene glycol  17 g Oral Daily   sertraline  100 mg Oral Daily   simvastatin  20 mg Oral Daily   PRN Meds: acetaminophen, albuterol, ALPRAZolam, fentaNYL, midazolam Continuous Infusions:  sodium chloride 150 mL/hr at 12/19/20 0900   sodium chloride       LOS: 1 day  Time spent: Greater than 50% of the 35 minute visit was spent in counseling/coordination of care for the patient as laid out in the A&P.   Dwyane Dee, MD [facility] 12/19/2020, 1:53 PM

## 2020-12-19 NOTE — Sedation Documentation (Signed)
Dr. Pascal Lux notified of drop in BP. Fluids wide open at this time

## 2020-12-19 NOTE — Progress Notes (Signed)
Request to start 2nd IV for iron infusion.  Discussed with bedside RN that best practice is to stop NS infusion for the 1 hour that the medication runs for vein preservation and infection control.  Tamekia, LPN will re enter request if current IV stops working.

## 2020-12-20 LAB — HEMOGLOBIN AND HEMATOCRIT, BLOOD
HCT: 22.5 % — ABNORMAL LOW (ref 36.0–46.0)
Hemoglobin: 7.6 g/dL — ABNORMAL LOW (ref 12.0–15.0)

## 2020-12-20 LAB — BASIC METABOLIC PANEL
Anion gap: 8 (ref 5–15)
BUN: 10 mg/dL (ref 8–23)
CO2: 22 mmol/L (ref 22–32)
Calcium: 7.7 mg/dL — ABNORMAL LOW (ref 8.9–10.3)
Chloride: 107 mmol/L (ref 98–111)
Creatinine, Ser: 0.49 mg/dL (ref 0.44–1.00)
GFR, Estimated: >60 mL/min (ref >60)
Glucose, Bld: 118 mg/dL — ABNORMAL HIGH (ref 70–99)
Potassium: 3.2 mmol/L — ABNORMAL LOW (ref 3.5–5.1)
Sodium: 137 mmol/L (ref 135–145)

## 2020-12-20 LAB — CBC
HCT: 20.9 % — ABNORMAL LOW (ref 36.0–46.0)
Hemoglobin: 6.8 g/dL — CL (ref 12.0–15.0)
MCH: 28.7 pg (ref 26.0–34.0)
MCHC: 32.5 g/dL (ref 30.0–36.0)
MCV: 88.2 fL (ref 80.0–100.0)
Platelets: 145 10*3/uL — ABNORMAL LOW (ref 150–400)
RBC: 2.37 MIL/uL — ABNORMAL LOW (ref 3.87–5.11)
RDW: 16.2 % — ABNORMAL HIGH (ref 11.5–15.5)
WBC: 12.4 10*3/uL — ABNORMAL HIGH (ref 4.0–10.5)
nRBC: 0.5 % — ABNORMAL HIGH (ref 0.0–0.2)

## 2020-12-20 LAB — PREPARE RBC (CROSSMATCH)

## 2020-12-20 LAB — MAGNESIUM: Magnesium: 2 mg/dL (ref 1.7–2.4)

## 2020-12-20 MED ORDER — SODIUM CHLORIDE 0.9 % IV BOLUS
250.0000 mL | Freq: Once | INTRAVENOUS | Status: AC
  Administered 2020-12-20: 250 mL via INTRAVENOUS

## 2020-12-20 MED ORDER — CHLORHEXIDINE GLUCONATE CLOTH 2 % EX PADS
6.0000 | MEDICATED_PAD | Freq: Every day | CUTANEOUS | Status: DC
  Administered 2020-12-20 – 2020-12-22 (×3): 6 via TOPICAL

## 2020-12-20 MED ORDER — FAMOTIDINE IN NACL 20-0.9 MG/50ML-% IV SOLN
20.0000 mg | Freq: Once | INTRAVENOUS | Status: AC
  Administered 2020-12-20: 20 mg via INTRAVENOUS
  Filled 2020-12-20: qty 50

## 2020-12-20 MED ORDER — ROPINIROLE HCL 1 MG PO TABS
0.5000 mg | ORAL_TABLET | Freq: Every day | ORAL | Status: DC
  Administered 2020-12-21 – 2020-12-23 (×3): 0.5 mg via ORAL
  Filled 2020-12-20 (×3): qty 1

## 2020-12-20 MED ORDER — SODIUM CHLORIDE 0.9% IV SOLUTION: Freq: Once | INTRAVENOUS | Status: AC

## 2020-12-20 MED ORDER — ACETAMINOPHEN 325 MG PO TABS
650.0000 mg | ORAL_TABLET | Freq: Once | ORAL | Status: AC
  Administered 2020-12-20: 650 mg via ORAL
  Filled 2020-12-20: qty 2

## 2020-12-20 MED ORDER — DIPHENHYDRAMINE HCL 25 MG PO CAPS
25.0000 mg | ORAL_CAPSULE | Freq: Once | ORAL | Status: AC
  Administered 2020-12-20: 25 mg via ORAL
  Filled 2020-12-20: qty 1

## 2020-12-20 MED ORDER — POTASSIUM CHLORIDE CRYS ER 20 MEQ PO TBCR
40.0000 meq | EXTENDED_RELEASE_TABLET | Freq: Every day | ORAL | Status: DC
  Administered 2020-12-20 – 2020-12-24 (×5): 40 meq via ORAL
  Filled 2020-12-20 (×5): qty 2

## 2020-12-20 MED ORDER — FUROSEMIDE 10 MG/ML IJ SOLN
20.0000 mg | Freq: Once | INTRAMUSCULAR | Status: AC
  Administered 2020-12-20: 20 mg via INTRAVENOUS
  Filled 2020-12-20: qty 2

## 2020-12-20 NOTE — Progress Notes (Addendum)
Called by nursing regarding patient firing yellow mews tachycardic 120s Patient also found to be slightly hypotensive systolic blood pressure 54Y Patient had darkish stool which I personally visualized at the bedside Patient is orientable and awakens  Discussed personally with Dr. Rosalie Gums of GI-embolization was performed on 6/21 so there is low likelihood that further CT angiogram or other modalities would offer any other treatment options  We will keep strict n.p.o., transfuse 1 unit PRBC and transferred to progressive unit  I will sign off to my partner coming on at 7 PM to watch for post transfusion hemoglobin and transfuse again if needed or if hemoglobin drops below 7   Discussed the case in detail with the charge nurse and nurse at the bedside  >30 minutes additional time

## 2020-12-20 NOTE — Progress Notes (Signed)
Pt arrived from 6N, alert and oriented, via bed. Pt oriented to unit. Call bell in place. Pt placed on progressive monitor. Rn completed blood transfusion prior to leaving the floor. All documents placed in pts chart.

## 2020-12-20 NOTE — Progress Notes (Addendum)
Critical result HB 6.8, MD notified

## 2020-12-20 NOTE — Progress Notes (Signed)
Doing well following yesterday's embolization.  No further bleeding--possibly 1 BM last night with old blood, none today.  No new labs since last night--appropriate post-transfusion rise in hgb following 1 U prc's yest.  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 Would observe in-house for 2 more days b/o tendency for diverticular bleeding to recur after 24-48 hrs of quiescence I will sign off; please call me if I can be of further assistance in patient's care Pt does not need f/u in our office, but would recomm f/u w/ PCP 1-2 wks post dischg to check cbc and overall status.   Cleotis Nipper, M.D. Pager [phone] If no answer or after 5 PM call [phone]

## 2020-12-20 NOTE — Progress Notes (Signed)
Patient sleepy  but oriented. Red blood cells currently infusing (started on previous shift). Patients vital signs are stable (see flow sheet). Report called/given to [place], RN 2W. Patient aware that she is being transferred to another unit. Will transport patient in bed.

## 2020-12-20 NOTE — Progress Notes (Signed)
Chief Complaint: Patient was seen today for follow up GI bleed s/p angio/embo  Supervising Physician: Markus Daft  Patient Status: [facility] - In-pt  Subjective: S/p mesenteric angio with embolization of a short segment of the marginal artery supplying ill defined area of extravasation within the splenic flexure of the colon. Pt resting in bed. Feels okay. Reports small BM of old clot, no new fresh BRBPR. Has only tried some Jell-O  Objective: Physical Exam: BP (!) 111/49 (BP Location: Left Arm)   Pulse (!) 102   Temp 97.8 F (36.6 C) (Oral)   Resp 17   Wt 80.9 kg   SpO2 91%   BMI 32.62 kg/m  A&O x 3 Abd: soft, NT, ND. Ext: (R)groin CFA access site soft, NT, no hematoma   Current Facility-Administered Medications:    0.9 %  sodium chloride infusion, , Intravenous, Continuous, Girguis, David, MD, Last Rate: 100 mL/hr at 12/20/20 0315, Infusion Verify at 12/20/20 0315   acetaminophen (TYLENOL) tablet 1,000 mg, 1,000 mg, Oral, Daily PRN, Wynetta Fines T, MD, 1,000 mg at 12/19/20 0921   albuterol (PROVENTIL) (2.5 MG/3ML) 0.083% nebulizer solution 2.5 mg, 2.5 mg, Inhalation, Daily PRN, Wynetta Fines T, MD   ALPRAZolam Duanne Moron) tablet 0.25 mg, 0.25 mg, Oral, Daily PRN, Wynetta Fines T, MD   cholecalciferol (VITAMIN D3) tablet 2,000 Units, 2,000 Units, Oral, BID, Wynetta Fines T, MD, 2,000 Units at 12/20/20 1013   diclofenac Sodium (VOLTAREN) 1 % topical gel 2 g, 2 g, Topical, QID, Wynetta Fines T, MD, 2 g at 12/20/20 1015   dorzolamide-timolol (COSOPT) 22.3-6.8 MG/ML ophthalmic solution 1 drop, 1 drop, Both Eyes, BID, Wynetta Fines T, MD, 1 drop at 12/20/20 1014   DULoxetine (CYMBALTA) DR capsule 20 mg, 20 mg, Oral, Daily, [place], Ping T, MD, 20 mg at 12/20/20 1015   guaiFENesin (MUCINEX) 12 hr tablet 600 mg, 600 mg, Oral, BID, [place], Ping T, MD, 600 mg at 12/20/20 1013   latanoprost (XALATAN) 0.005 % ophthalmic solution 1 drop, 1 drop, Both Eyes, QHS, Wynetta Fines T, MD, 1 drop at 12/19/20 2123    loratadine (CLARITIN) tablet 10 mg, 10 mg, Oral, Daily, Wynetta Fines T, MD, 10 mg at 12/20/20 1013   multivitamin (PROSIGHT) tablet 1 tablet, 1 tablet, Oral, BID, Wynetta Fines T, MD, 1 tablet at 12/20/20 1012   multivitamin with minerals tablet 1 tablet, 1 tablet, Oral, Daily, Wynetta Fines T, MD, 1 tablet at 12/20/20 1014   pantoprazole (PROTONIX) EC tablet 40 mg, 40 mg, Oral, Daily, Wynetta Fines T, MD, 40 mg at 12/20/20 1013   polyethylene glycol (MIRALAX / GLYCOLAX) packet 17 g, 17 g, Oral, Daily, [place], Ping T, MD, 17 g at 12/20/20 1014   potassium chloride SA (KLOR-CON) CR tablet 40 mEq, 40 mEq, Oral, Daily, Samtani, Jai-Gurmukh, MD, 40 mEq at 12/20/20 1013   sertraline (ZOLOFT) tablet 100 mg, 100 mg, Oral, Daily, [place], Ping T, MD, 100 mg at 12/20/20 1013   simvastatin (ZOCOR) tablet 20 mg, 20 mg, Oral, Daily, Wynetta Fines T, MD, 20 mg at 12/20/20 1013  Labs: CBC Recent Labs    12/18/20 1439 12/19/20 0144 12/19/20 1448 12/20/20 0028  WBC 6.5 8.6  --   --   HGB 6.4* 8.7* 6.5* 7.6*  HCT 21.1* 26.6* 20.2* 22.5*  PLT 173 132*  --   --    BMET Recent Labs    12/19/20 0144 12/20/20 0028  NA 138 137  K 3.5 3.2*  CL 105 107  CO2 26 22  GLUCOSE 111* 118*  BUN 17 10  CREATININE 0.53 0.49  CALCIUM 8.3* 7.7*   LFT Recent Labs    12/18/20 1439  PROT 5.4*  ALBUMIN 3.2*  AST 19  ALT 12  ALKPHOS 36*  BILITOT 0.9   PT/INR Recent Labs    12/18/20 1439  LABPROT 15.0  INR 1.2     Studies/Results: IR Angiogram Visceral Selective  Result Date: 12/19/2020 INDICATION: Acute lower GI bleeding. Positive CTA with active extravasation at the level of the splenic flexure of the colon. Please perform mesenteric arteriogram and percutaneous embolization as indicated. EXAM: 1. ULTRASOUND GUIDANCE FOR ARTERIAL ACCESS 2. SELECTIVE SUPERIOR MESENTERIC ARTERIOGRAM 3. SELECTIVE INFERIOR MESENTERIC ARTERIOGRAM 4. SUB SELECTIVE LEFT COLIC ARTERIOGRAM 5. SUB SELECTIVE MARGINAL ARTERY ARTERIOGRAM  AND PERCUTANEOUS COIL EMBOLIZATION 6. SUB SELECTIVE SIGMOIDAL ARTERIOGRAM COMPARISON:  CTA abdomen and pelvis-earlier same day MEDICATIONS: None ANESTHESIA/SEDATION: Moderate (conscious) sedation was employed during this procedure. A total of Versed 1 mg and Fentanyl 25 mcg was administered intravenously. Moderate Sedation Time: 62 minutes. The patient's level of consciousness and vital signs were monitored continuously by radiology nursing throughout the procedure under my direct supervision. FLUOROSCOPY TIME:  21 minutes (8,756 mGy) COMPLICATIONS: None immediate. PROCEDURE: Informed consent was obtained from the patient following explanation of the procedure, risks, benefits and alternatives. All questions were addressed. A time out was performed prior to the initiation of the procedure. Maximal barrier sterile technique utilized including caps, mask, sterile gowns, sterile gloves, large sterile drape, hand hygiene, and Betadine prep. The right femoral head was marked fluoroscopically. Under sterile conditions and local anesthesia, the right common femoral artery access was performed with a micropuncture needle. Under direct ultrasound guidance, the right common femoral was accessed with a micropuncture kit. An ultrasound image was saved for documentation purposes. This allowed for placement of a 5-French vascular sheath. A limited arteriogram was performed through the side arm of the sheath confirming appropriate access within the right common femoral artery. Over a Bentson wire, a Mickelson catheter was advanced the caudal aspect of the thoracic aorta where was reformed, back bled and flushed. The Mickelson catheter was then utilized to select the superior mesenteric artery and a selective superior mesenteric arteriogram was performed. The Mickelson catheter was then utilized to select the inferior mesenteric artery and a selective inferior mesenteric arteriogram was performed. Next, with the use of a fathom 14  microwire, a STC Renegade microcatheter was advanced into the left colic artery and a selective left colic arteriogram was performed. Microcatheter was advanced to the level of the marginal artery and a selective marginal artery arteriogram was performed. Microcatheter was advanced beyond the origin of a tiny distal arcade branch supplying an ill-defined area of active contrast extravasation within the splenic flexure of the colon correlating with the findings on preceding CTA. Next, a short segment of the marginal artery with percutaneously coil embolized with 2 mm and 3 mm diameter interlock coils. The microcatheter was retracted to the more central aspect of the marginal artery and a post embolization marginal arteriogram was performed. Microcatheter was retracted to the level of the left colic artery and a post embolization left colic arteriogram was performed Next, with the use of the fathom 14 microwire, the microcatheter was utilized to select the sigmoid artery and a sub selective sigmoid arteriogram was performed. The microcatheter was advanced to the opposite side of the embolized segment of the marginal artery and a sub selective marginal arteriogram was performed. Images were  reviewed and the procedure was terminated. All wires, catheters and sheaths were removed from the patient. Hemostasis was achieved at the right groin access site with deployment of an ExoSeal closure device and manual compression. Dressings were applied. The patient tolerated the procedure well without immediate post procedural complication. FINDINGS: Selective superior mesenteric arteriogram is negative for significant arterial supply to the ill-defined area of contrast extravasation within the splenic flexure of the colon. Excreted contrast is noted within a focally dilated left superior pole calyx as was demonstrated on preceding abdominal CT. Selective inferior mesenteric arteriogram demonstrates predominant arterial supply to  the splenic flexure of the colon via the left colic artery. Sub selective left colic arteriogram demonstrates an ill-defined area of contrast extravasation arising from a tiny arcade branch of the marginal artery. This finding was confirmed with sub selective marginal artery arteriogram. Ultimately, a short segment of the marginal artery was percutaneously coil embolized with overlapping interlock coils traversing the origin of the tiny distal arcade branch supplying the ill-defined area of active extravasation. Post embolization marginal artery and left colic arteriograms were negative for discrete area of residual contrast extravasation. Given robust arterial supply to the splenic flexure of the colon from the sigmoid artery, a selective sigmoid arteriogram was performed as was a sub selective marginal artery arteriogram supplying the opposite side of the embolized segment of the vessel however both were negative for discrete area of collateral arterial supply to the ill-defined area of active extravasation within the splenic flexure of the colon. IMPRESSION: Technically successful percutaneous coil embolization of a short segment of the marginal artery supplying the origin of a tiny distal arcade which demonstrated active intraluminal contrast extravasation, compatible with the findings seen on preceding CTA. PLAN: - The patient is to remain flat for 4 hours with right leg straight. - The patient will continue to experience several additional bloody bowel movements and may continue to require additional resuscitation (as she was bleeding both before and during the procedure), however ultimately I am hopeful she will stabilize in the coming days. - If there remains clinical concern for persistent lower GI bleeding, would recommend coordinating with the interventional radiology service to perform a nuclear medicine tagged red blood cell study during daytime working hours as to expedite potential repeat angiography  if the tagged study is positive. 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 Electronically Signed   By: Sandi Mariscal M.D.   On: 12/19/2020 16:01   IR Angiogram Selective Each Additional Vessel  Result Date: 12/19/2020 INDICATION: Acute lower GI bleeding. Positive CTA with active extravasation at the level of the splenic flexure of the colon. Please perform mesenteric arteriogram and percutaneous embolization as indicated. EXAM: 1. ULTRASOUND GUIDANCE FOR ARTERIAL ACCESS 2. SELECTIVE SUPERIOR MESENTERIC ARTERIOGRAM 3. SELECTIVE INFERIOR MESENTERIC ARTERIOGRAM 4. SUB SELECTIVE LEFT COLIC ARTERIOGRAM 5. SUB SELECTIVE MARGINAL ARTERY ARTERIOGRAM AND PERCUTANEOUS COIL EMBOLIZATION 6. SUB SELECTIVE SIGMOIDAL ARTERIOGRAM COMPARISON:  CTA abdomen and pelvis-earlier same day MEDICATIONS: None ANESTHESIA/SEDATION: Moderate (conscious) sedation was employed during this procedure. A total of Versed 1 mg and Fentanyl 25 mcg was administered intravenously. Moderate Sedation Time: 62 minutes. The patient's level of consciousness and vital signs were monitored continuously by radiology nursing throughout the procedure under my direct supervision. FLUOROSCOPY TIME:  21 minutes (2,703 mGy) COMPLICATIONS: None immediate. PROCEDURE: Informed consent was obtained from the patient following explanation of the procedure, risks, benefits and  alternatives. All questions were addressed. A time out was performed prior to the initiation of the procedure. Maximal barrier sterile technique utilized including caps, mask, sterile gowns, sterile gloves, large sterile drape, hand hygiene, and Betadine prep. The right femoral head was marked fluoroscopically. Under sterile conditions and local anesthesia, the right common femoral artery access was performed with a micropuncture needle. Under direct ultrasound  guidance, the right common femoral was accessed with a micropuncture kit. An ultrasound image was saved for documentation purposes. This allowed for placement of a 5-French vascular sheath. A limited arteriogram was performed through the side arm of the sheath confirming appropriate access within the right common femoral artery. Over a Bentson wire, a Mickelson catheter was advanced the caudal aspect of the thoracic aorta where was reformed, back bled and flushed. The Mickelson catheter was then utilized to select the superior mesenteric artery and a selective superior mesenteric arteriogram was performed. The Mickelson catheter was then utilized to select the inferior mesenteric artery and a selective inferior mesenteric arteriogram was performed. Next, with the use of a fathom 14 microwire, a STC Renegade microcatheter was advanced into the left colic artery and a selective left colic arteriogram was performed. Microcatheter was advanced to the level of the marginal artery and a selective marginal artery arteriogram was performed. Microcatheter was advanced beyond the origin of a tiny distal arcade branch supplying an ill-defined area of active contrast extravasation within the splenic flexure of the colon correlating with the findings on preceding CTA. Next, a short segment of the marginal artery with percutaneously coil embolized with 2 mm and 3 mm diameter interlock coils. The microcatheter was retracted to the more central aspect of the marginal artery and a post embolization marginal arteriogram was performed. Microcatheter was retracted to the level of the left colic artery and a post embolization left colic arteriogram was performed Next, with the use of the fathom 14 microwire, the microcatheter was utilized to select the sigmoid artery and a sub selective sigmoid arteriogram was performed. The microcatheter was advanced to the opposite side of the embolized segment of the marginal artery and a sub  selective marginal arteriogram was performed. Images were reviewed and the procedure was terminated. All wires, catheters and sheaths were removed from the patient. Hemostasis was achieved at the right groin access site with deployment of an ExoSeal closure device and manual compression. Dressings were applied. The patient tolerated the procedure well without immediate post procedural complication. FINDINGS: Selective superior mesenteric arteriogram is negative for significant arterial supply to the ill-defined area of contrast extravasation within the splenic flexure of the colon. Excreted contrast is noted within a focally dilated left superior pole calyx as was demonstrated on preceding abdominal CT. Selective inferior mesenteric arteriogram demonstrates predominant arterial supply to the splenic flexure of the colon via the left colic artery. Sub selective left colic arteriogram demonstrates an ill-defined area of contrast extravasation arising from a tiny arcade branch of the marginal artery. This finding was confirmed with sub selective marginal artery arteriogram. Ultimately, a short segment of the marginal artery was percutaneously coil embolized with overlapping interlock coils traversing the origin of the tiny distal arcade branch supplying the ill-defined area of active extravasation. Post embolization marginal artery and left colic arteriograms were negative for discrete area of residual contrast extravasation. Given robust arterial supply to the splenic flexure of the colon from the sigmoid artery, a selective sigmoid arteriogram was performed as was a sub selective marginal artery arteriogram supplying the opposite side  of the embolized segment of the vessel however both were negative for discrete area of collateral arterial supply to the ill-defined area of active extravasation within the splenic flexure of the colon. IMPRESSION: Technically successful percutaneous coil embolization of a short segment  of the marginal artery supplying the origin of a tiny distal arcade which demonstrated active intraluminal contrast extravasation, compatible with the findings seen on preceding CTA. PLAN: - The patient is to remain flat for 4 hours with right leg straight. - The patient will continue to experience several additional bloody bowel movements and may continue to require additional resuscitation (as she was bleeding both before and during the procedure), however ultimately I am hopeful she will stabilize in the coming days. - If there remains clinical concern for persistent lower GI bleeding, would recommend coordinating with the interventional radiology service to perform a nuclear medicine tagged red blood cell study during daytime working hours as to expedite potential repeat angiography if the tagged study is positive. 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 Electronically Signed   By: Sandi Mariscal M.D.   On: 12/19/2020 16:01   IR Angiogram Selective Each Additional Vessel  Result Date: 12/19/2020 INDICATION: Acute lower GI bleeding. Positive CTA with active extravasation at the level of the splenic flexure of the colon. Please perform mesenteric arteriogram and percutaneous embolization as indicated. EXAM: 1. ULTRASOUND GUIDANCE FOR ARTERIAL ACCESS 2. SELECTIVE SUPERIOR MESENTERIC ARTERIOGRAM 3. SELECTIVE INFERIOR MESENTERIC ARTERIOGRAM 4. SUB SELECTIVE LEFT COLIC ARTERIOGRAM 5. SUB SELECTIVE MARGINAL ARTERY ARTERIOGRAM AND PERCUTANEOUS COIL EMBOLIZATION 6. SUB SELECTIVE SIGMOIDAL ARTERIOGRAM COMPARISON:  CTA abdomen and pelvis-earlier same day MEDICATIONS: None ANESTHESIA/SEDATION: Moderate (conscious) sedation was employed during this procedure. A total of Versed 1 mg and Fentanyl 25 mcg was administered intravenously. Moderate Sedation Time: 62 minutes. The patient's level  of consciousness and vital signs were monitored continuously by radiology nursing throughout the procedure under my direct supervision. FLUOROSCOPY TIME:  21 minutes (7,681 mGy) COMPLICATIONS: None immediate. PROCEDURE: Informed consent was obtained from the patient following explanation of the procedure, risks, benefits and alternatives. All questions were addressed. A time out was performed prior to the initiation of the procedure. Maximal barrier sterile technique utilized including caps, mask, sterile gowns, sterile gloves, large sterile drape, hand hygiene, and Betadine prep. The right femoral head was marked fluoroscopically. Under sterile conditions and local anesthesia, the right common femoral artery access was performed with a micropuncture needle. Under direct ultrasound guidance, the right common femoral was accessed with a micropuncture kit. An ultrasound image was saved for documentation purposes. This allowed for placement of a 5-French vascular sheath. A limited arteriogram was performed through the side arm of the sheath confirming appropriate access within the right common femoral artery. Over a Bentson wire, a Mickelson catheter was advanced the caudal aspect of the thoracic aorta where was reformed, back bled and flushed. The Mickelson catheter was then utilized to select the superior mesenteric artery and a selective superior mesenteric arteriogram was performed. The Mickelson catheter was then utilized to select the inferior mesenteric artery and a selective inferior mesenteric arteriogram was performed. Next, with the use of a fathom 14 microwire, a STC Renegade microcatheter was advanced into the left colic artery and a selective left colic arteriogram was performed. Microcatheter was advanced to the level of the marginal artery and a selective marginal artery arteriogram was performed. Microcatheter was advanced beyond  the origin of a tiny distal arcade branch supplying an ill-defined area  of active contrast extravasation within the splenic flexure of the colon correlating with the findings on preceding CTA. Next, a short segment of the marginal artery with percutaneously coil embolized with 2 mm and 3 mm diameter interlock coils. The microcatheter was retracted to the more central aspect of the marginal artery and a post embolization marginal arteriogram was performed. Microcatheter was retracted to the level of the left colic artery and a post embolization left colic arteriogram was performed Next, with the use of the fathom 14 microwire, the microcatheter was utilized to select the sigmoid artery and a sub selective sigmoid arteriogram was performed. The microcatheter was advanced to the opposite side of the embolized segment of the marginal artery and a sub selective marginal arteriogram was performed. Images were reviewed and the procedure was terminated. All wires, catheters and sheaths were removed from the patient. Hemostasis was achieved at the right groin access site with deployment of an ExoSeal closure device and manual compression. Dressings were applied. The patient tolerated the procedure well without immediate post procedural complication. FINDINGS: Selective superior mesenteric arteriogram is negative for significant arterial supply to the ill-defined area of contrast extravasation within the splenic flexure of the colon. Excreted contrast is noted within a focally dilated left superior pole calyx as was demonstrated on preceding abdominal CT. Selective inferior mesenteric arteriogram demonstrates predominant arterial supply to the splenic flexure of the colon via the left colic artery. Sub selective left colic arteriogram demonstrates an ill-defined area of contrast extravasation arising from a tiny arcade branch of the marginal artery. This finding was confirmed with sub selective marginal artery arteriogram. Ultimately, a short segment of the marginal artery was percutaneously coil  embolized with overlapping interlock coils traversing the origin of the tiny distal arcade branch supplying the ill-defined area of active extravasation. Post embolization marginal artery and left colic arteriograms were negative for discrete area of residual contrast extravasation. Given robust arterial supply to the splenic flexure of the colon from the sigmoid artery, a selective sigmoid arteriogram was performed as was a sub selective marginal artery arteriogram supplying the opposite side of the embolized segment of the vessel however both were negative for discrete area of collateral arterial supply to the ill-defined area of active extravasation within the splenic flexure of the colon. IMPRESSION: Technically successful percutaneous coil embolization of a short segment of the marginal artery supplying the origin of a tiny distal arcade which demonstrated active intraluminal contrast extravasation, compatible with the findings seen on preceding CTA. PLAN: - The patient is to remain flat for 4 hours with right leg straight. - The patient will continue to experience several additional bloody bowel movements and may continue to require additional resuscitation (as she was bleeding both before and during the procedure), however ultimately I am hopeful she will stabilize in the coming days. - If there remains clinical concern for persistent lower GI bleeding, would recommend coordinating with the interventional radiology service to perform a nuclear medicine tagged red blood cell study during daytime working hours as to expedite potential repeat angiography if the tagged study is positive. 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 Electronically Signed   By: Sandi Mariscal M.D.   On: 12/19/2020 16:01   IR Angiogram Follow Up Study  Result Date: 12/19/2020 INDICATION: Acute lower GI  bleeding. Positive CTA  with active extravasation at the level of the splenic flexure of the colon. Please perform mesenteric arteriogram and percutaneous embolization as indicated. EXAM: 1. ULTRASOUND GUIDANCE FOR ARTERIAL ACCESS 2. SELECTIVE SUPERIOR MESENTERIC ARTERIOGRAM 3. SELECTIVE INFERIOR MESENTERIC ARTERIOGRAM 4. SUB SELECTIVE LEFT COLIC ARTERIOGRAM 5. SUB SELECTIVE MARGINAL ARTERY ARTERIOGRAM AND PERCUTANEOUS COIL EMBOLIZATION 6. SUB SELECTIVE SIGMOIDAL ARTERIOGRAM COMPARISON:  CTA abdomen and pelvis-earlier same day MEDICATIONS: None ANESTHESIA/SEDATION: Moderate (conscious) sedation was employed during this procedure. A total of Versed 1 mg and Fentanyl 25 mcg was administered intravenously. Moderate Sedation Time: 62 minutes. The patient's level of consciousness and vital signs were monitored continuously by radiology nursing throughout the procedure under my direct supervision. FLUOROSCOPY TIME:  21 minutes (4,665 mGy) COMPLICATIONS: None immediate. PROCEDURE: Informed consent was obtained from the patient following explanation of the procedure, risks, benefits and alternatives. All questions were addressed. A time out was performed prior to the initiation of the procedure. Maximal barrier sterile technique utilized including caps, mask, sterile gowns, sterile gloves, large sterile drape, hand hygiene, and Betadine prep. The right femoral head was marked fluoroscopically. Under sterile conditions and local anesthesia, the right common femoral artery access was performed with a micropuncture needle. Under direct ultrasound guidance, the right common femoral was accessed with a micropuncture kit. An ultrasound image was saved for documentation purposes. This allowed for placement of a 5-French vascular sheath. A limited arteriogram was performed through the side arm of the sheath confirming appropriate access within the right common femoral artery. Over a Bentson wire, a Mickelson catheter was advanced  the caudal aspect of the thoracic aorta where was reformed, back bled and flushed. The Mickelson catheter was then utilized to select the superior mesenteric artery and a selective superior mesenteric arteriogram was performed. The Mickelson catheter was then utilized to select the inferior mesenteric artery and a selective inferior mesenteric arteriogram was performed. Next, with the use of a fathom 14 microwire, a STC Renegade microcatheter was advanced into the left colic artery and a selective left colic arteriogram was performed. Microcatheter was advanced to the level of the marginal artery and a selective marginal artery arteriogram was performed. Microcatheter was advanced beyond the origin of a tiny distal arcade branch supplying an ill-defined area of active contrast extravasation within the splenic flexure of the colon correlating with the findings on preceding CTA. Next, a short segment of the marginal artery with percutaneously coil embolized with 2 mm and 3 mm diameter interlock coils. The microcatheter was retracted to the more central aspect of the marginal artery and a post embolization marginal arteriogram was performed. Microcatheter was retracted to the level of the left colic artery and a post embolization left colic arteriogram was performed Next, with the use of the fathom 14 microwire, the microcatheter was utilized to select the sigmoid artery and a sub selective sigmoid arteriogram was performed. The microcatheter was advanced to the opposite side of the embolized segment of the marginal artery and a sub selective marginal arteriogram was performed. Images were reviewed and the procedure was terminated. All wires, catheters and sheaths were removed from the patient. Hemostasis was achieved at the right groin access site with deployment of an ExoSeal closure device and manual compression. Dressings were applied. The patient tolerated the procedure well without immediate post procedural  complication. FINDINGS: Selective superior mesenteric arteriogram is negative for significant arterial supply to the ill-defined area of contrast extravasation within the splenic flexure of the colon. Excreted contrast is noted within a focally dilated left  superior pole calyx as was demonstrated on preceding abdominal CT. Selective inferior mesenteric arteriogram demonstrates predominant arterial supply to the splenic flexure of the colon via the left colic artery. Sub selective left colic arteriogram demonstrates an ill-defined area of contrast extravasation arising from a tiny arcade branch of the marginal artery. This finding was confirmed with sub selective marginal artery arteriogram. Ultimately, a short segment of the marginal artery was percutaneously coil embolized with overlapping interlock coils traversing the origin of the tiny distal arcade branch supplying the ill-defined area of active extravasation. Post embolization marginal artery and left colic arteriograms were negative for discrete area of residual contrast extravasation. Given robust arterial supply to the splenic flexure of the colon from the sigmoid artery, a selective sigmoid arteriogram was performed as was a sub selective marginal artery arteriogram supplying the opposite side of the embolized segment of the vessel however both were negative for discrete area of collateral arterial supply to the ill-defined area of active extravasation within the splenic flexure of the colon. IMPRESSION: Technically successful percutaneous coil embolization of a short segment of the marginal artery supplying the origin of a tiny distal arcade which demonstrated active intraluminal contrast extravasation, compatible with the findings seen on preceding CTA. PLAN: - The patient is to remain flat for 4 hours with right leg straight. - The patient will continue to experience several additional bloody bowel movements and may continue to require additional  resuscitation (as she was bleeding both before and during the procedure), however ultimately I am hopeful she will stabilize in the coming days. - If there remains clinical concern for persistent lower GI bleeding, would recommend coordinating with the interventional radiology service to perform a nuclear medicine tagged red blood cell study during daytime working hours as to expedite potential repeat angiography if the tagged study is positive. 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 Electronically Signed   By: Sandi Mariscal M.D.   On: 12/19/2020 16:01   IR US Guide Vasc Access Right  Result Date: 12/19/2020 INDICATION: Acute lower GI bleeding. Positive CTA with active extravasation at the level of the splenic flexure of the colon. Please perform mesenteric arteriogram and percutaneous embolization as indicated. EXAM: 1. ULTRASOUND GUIDANCE FOR ARTERIAL ACCESS 2. SELECTIVE SUPERIOR MESENTERIC ARTERIOGRAM 3. SELECTIVE INFERIOR MESENTERIC ARTERIOGRAM 4. SUB SELECTIVE LEFT COLIC ARTERIOGRAM 5. SUB SELECTIVE MARGINAL ARTERY ARTERIOGRAM AND PERCUTANEOUS COIL EMBOLIZATION 6. SUB SELECTIVE SIGMOIDAL ARTERIOGRAM COMPARISON:  CTA abdomen and pelvis-earlier same day MEDICATIONS: None ANESTHESIA/SEDATION: Moderate (conscious) sedation was employed during this procedure. A total of Versed 1 mg and Fentanyl 25 mcg was administered intravenously. Moderate Sedation Time: 62 minutes. The patient's level of consciousness and vital signs were monitored continuously by radiology nursing throughout the procedure under my direct supervision. FLUOROSCOPY TIME:  21 minutes (1,308 mGy) COMPLICATIONS: None immediate. PROCEDURE: Informed consent was obtained from the patient following explanation of the procedure, risks, benefits and alternatives. All questions were addressed. A time out was performed  prior to the initiation of the procedure. Maximal barrier sterile technique utilized including caps, mask, sterile gowns, sterile gloves, large sterile drape, hand hygiene, and Betadine prep. The right femoral head was marked fluoroscopically. Under sterile conditions and local anesthesia, the right common femoral artery access was performed with a micropuncture needle. Under direct ultrasound guidance, the right common femoral was accessed with a micropuncture kit. An ultrasound image was saved for documentation  purposes. This allowed for placement of a 5-French vascular sheath. A limited arteriogram was performed through the side arm of the sheath confirming appropriate access within the right common femoral artery. Over a Bentson wire, a Mickelson catheter was advanced the caudal aspect of the thoracic aorta where was reformed, back bled and flushed. The Mickelson catheter was then utilized to select the superior mesenteric artery and a selective superior mesenteric arteriogram was performed. The Mickelson catheter was then utilized to select the inferior mesenteric artery and a selective inferior mesenteric arteriogram was performed. Next, with the use of a fathom 14 microwire, a STC Renegade microcatheter was advanced into the left colic artery and a selective left colic arteriogram was performed. Microcatheter was advanced to the level of the marginal artery and a selective marginal artery arteriogram was performed. Microcatheter was advanced beyond the origin of a tiny distal arcade branch supplying an ill-defined area of active contrast extravasation within the splenic flexure of the colon correlating with the findings on preceding CTA. Next, a short segment of the marginal artery with percutaneously coil embolized with 2 mm and 3 mm diameter interlock coils. The microcatheter was retracted to the more central aspect of the marginal artery and a post embolization marginal arteriogram was performed.  Microcatheter was retracted to the level of the left colic artery and a post embolization left colic arteriogram was performed Next, with the use of the fathom 14 microwire, the microcatheter was utilized to select the sigmoid artery and a sub selective sigmoid arteriogram was performed. The microcatheter was advanced to the opposite side of the embolized segment of the marginal artery and a sub selective marginal arteriogram was performed. Images were reviewed and the procedure was terminated. All wires, catheters and sheaths were removed from the patient. Hemostasis was achieved at the right groin access site with deployment of an ExoSeal closure device and manual compression. Dressings were applied. The patient tolerated the procedure well without immediate post procedural complication. FINDINGS: Selective superior mesenteric arteriogram is negative for significant arterial supply to the ill-defined area of contrast extravasation within the splenic flexure of the colon. Excreted contrast is noted within a focally dilated left superior pole calyx as was demonstrated on preceding abdominal CT. Selective inferior mesenteric arteriogram demonstrates predominant arterial supply to the splenic flexure of the colon via the left colic artery. Sub selective left colic arteriogram demonstrates an ill-defined area of contrast extravasation arising from a tiny arcade branch of the marginal artery. This finding was confirmed with sub selective marginal artery arteriogram. Ultimately, a short segment of the marginal artery was percutaneously coil embolized with overlapping interlock coils traversing the origin of the tiny distal arcade branch supplying the ill-defined area of active extravasation. Post embolization marginal artery and left colic arteriograms were negative for discrete area of residual contrast extravasation. Given robust arterial supply to the splenic flexure of the colon from the sigmoid artery, a selective  sigmoid arteriogram was performed as was a sub selective marginal artery arteriogram supplying the opposite side of the embolized segment of the vessel however both were negative for discrete area of collateral arterial supply to the ill-defined area of active extravasation within the splenic flexure of the colon. IMPRESSION: Technically successful percutaneous coil embolization of a short segment of the marginal artery supplying the origin of a tiny distal arcade which demonstrated active intraluminal contrast extravasation, compatible with the findings seen on preceding CTA. PLAN: - The patient is to remain flat for 4 hours with right leg straight. -  The patient will continue to experience several additional bloody bowel movements and may continue to require additional resuscitation (as she was bleeding both before and during the procedure), however ultimately I am hopeful she will stabilize in the coming days. - If there remains clinical concern for persistent lower GI bleeding, would recommend coordinating with the interventional radiology service to perform a nuclear medicine tagged red blood cell study during daytime working hours as to expedite potential repeat angiography if the tagged study is positive. 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 Electronically Signed   By: Sandi Mariscal M.D.   On: 12/19/2020 16:01   IR EMBO ART  VEN HEMORR LYMPH EXTRAV  INC GUIDE ROADMAPPING  Result Date: 12/19/2020 INDICATION: Acute lower GI bleeding. Positive CTA with active extravasation at the level of the splenic flexure of the colon. Please perform mesenteric arteriogram and percutaneous embolization as indicated. EXAM: 1. ULTRASOUND GUIDANCE FOR ARTERIAL ACCESS 2. SELECTIVE SUPERIOR MESENTERIC ARTERIOGRAM 3. SELECTIVE INFERIOR MESENTERIC ARTERIOGRAM 4. SUB SELECTIVE LEFT COLIC  ARTERIOGRAM 5. SUB SELECTIVE MARGINAL ARTERY ARTERIOGRAM AND PERCUTANEOUS COIL EMBOLIZATION 6. SUB SELECTIVE SIGMOIDAL ARTERIOGRAM COMPARISON:  CTA abdomen and pelvis-earlier same day MEDICATIONS: None ANESTHESIA/SEDATION: Moderate (conscious) sedation was employed during this procedure. A total of Versed 1 mg and Fentanyl 25 mcg was administered intravenously. Moderate Sedation Time: 62 minutes. The patient's level of consciousness and vital signs were monitored continuously by radiology nursing throughout the procedure under my direct supervision. FLUOROSCOPY TIME:  21 minutes (1,610 mGy) COMPLICATIONS: None immediate. PROCEDURE: Informed consent was obtained from the patient following explanation of the procedure, risks, benefits and alternatives. All questions were addressed. A time out was performed prior to the initiation of the procedure. Maximal barrier sterile technique utilized including caps, mask, sterile gowns, sterile gloves, large sterile drape, hand hygiene, and Betadine prep. The right femoral head was marked fluoroscopically. Under sterile conditions and local anesthesia, the right common femoral artery access was performed with a micropuncture needle. Under direct ultrasound guidance, the right common femoral was accessed with a micropuncture kit. An ultrasound image was saved for documentation purposes. This allowed for placement of a 5-French vascular sheath. A limited arteriogram was performed through the side arm of the sheath confirming appropriate access within the right common femoral artery. Over a Bentson wire, a Mickelson catheter was advanced the caudal aspect of the thoracic aorta where was reformed, back bled and flushed. The Mickelson catheter was then utilized to select the superior mesenteric artery and a selective superior mesenteric arteriogram was performed. The Mickelson catheter was then utilized to select the inferior mesenteric artery and a selective inferior mesenteric  arteriogram was performed. Next, with the use of a fathom 14 microwire, a STC Renegade microcatheter was advanced into the left colic artery and a selective left colic arteriogram was performed. Microcatheter was advanced to the level of the marginal artery and a selective marginal artery arteriogram was performed. Microcatheter was advanced beyond the origin of a tiny distal arcade branch supplying an ill-defined area of active contrast extravasation within the splenic flexure of the colon correlating with the findings on preceding CTA. Next, a short segment of the marginal artery with percutaneously coil embolized with 2 mm and 3 mm diameter interlock coils. The microcatheter was retracted to the more central aspect of the marginal artery and a post embolization marginal arteriogram was performed. Microcatheter was retracted to the level  of the left colic artery and a post embolization left colic arteriogram was performed Next, with the use of the fathom 14 microwire, the microcatheter was utilized to select the sigmoid artery and a sub selective sigmoid arteriogram was performed. The microcatheter was advanced to the opposite side of the embolized segment of the marginal artery and a sub selective marginal arteriogram was performed. Images were reviewed and the procedure was terminated. All wires, catheters and sheaths were removed from the patient. Hemostasis was achieved at the right groin access site with deployment of an ExoSeal closure device and manual compression. Dressings were applied. The patient tolerated the procedure well without immediate post procedural complication. FINDINGS: Selective superior mesenteric arteriogram is negative for significant arterial supply to the ill-defined area of contrast extravasation within the splenic flexure of the colon. Excreted contrast is noted within a focally dilated left superior pole calyx as was demonstrated on preceding abdominal CT. Selective inferior  mesenteric arteriogram demonstrates predominant arterial supply to the splenic flexure of the colon via the left colic artery. Sub selective left colic arteriogram demonstrates an ill-defined area of contrast extravasation arising from a tiny arcade branch of the marginal artery. This finding was confirmed with sub selective marginal artery arteriogram. Ultimately, a short segment of the marginal artery was percutaneously coil embolized with overlapping interlock coils traversing the origin of the tiny distal arcade branch supplying the ill-defined area of active extravasation. Post embolization marginal artery and left colic arteriograms were negative for discrete area of residual contrast extravasation. Given robust arterial supply to the splenic flexure of the colon from the sigmoid artery, a selective sigmoid arteriogram was performed as was a sub selective marginal artery arteriogram supplying the opposite side of the embolized segment of the vessel however both were negative for discrete area of collateral arterial supply to the ill-defined area of active extravasation within the splenic flexure of the colon. IMPRESSION: Technically successful percutaneous coil embolization of a short segment of the marginal artery supplying the origin of a tiny distal arcade which demonstrated active intraluminal contrast extravasation, compatible with the findings seen on preceding CTA. PLAN: - The patient is to remain flat for 4 hours with right leg straight. - The patient will continue to experience several additional bloody bowel movements and may continue to require additional resuscitation (as she was bleeding both before and during the procedure), however ultimately I am hopeful she will stabilize in the coming days. - If there remains clinical concern for persistent lower GI bleeding, would recommend coordinating with the interventional radiology service to perform a nuclear medicine tagged red blood cell study during  daytime working hours as to expedite potential repeat angiography if the tagged study is positive. 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 Electronically Signed   By: Sandi Mariscal M.D.   On: 12/19/2020 16:01   CT ANGIO GI BLEED  Result Date: 12/19/2020 CLINICAL DATA:  Diverticulosis, recurrent diverticular hemorrhage, continued hematochezia EXAM: CTA ABDOMEN AND PELVIS WITHOUT AND WITH CONTRAST TECHNIQUE: Multidetector CT imaging of the abdomen and pelvis was performed using the standard protocol during bolus administration of intravenous contrast. Multiplanar reconstructed images and MIPs were obtained and reviewed to evaluate the vascular anatomy. CONTRAST:  162m OMNIPAQUE IOHEXOL 350 MG/ML SOLN COMPARISON:  CT pelvis, 08/17/2018 FINDINGS: VASCULAR Moderate calcific aortic atherosclerosis. Normal contour and caliber of the abdominal aorta. 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 Otherwise standard branching pattern of the abdominal aorta. Atherosclerosis at the branch vessel origins without significant stenosis. There is an intraluminal arterial contrast blush within the distal transverse colon, just proximal to the splenic flexure, with multiple diverticula in this vicinity (series 7, image 36, series 12, image 66). Review of the MIP images confirms the above findings. NON-VASCULAR Lower chest: No acute abnormality. Hepatobiliary: No focal liver abnormality is seen. No gallstones, gallbladder wall thickening, or biliary dilatation. Pancreas: Unremarkable. No pancreatic ductal dilatation or surrounding inflammatory changes. Spleen: Normal in size without focal abnormality. Adrenals/Urinary Tract: Adrenal glands are unremarkable.  Left-sided parapelvic cyst. Kidneys are otherwise normal, without renal calculi, solid lesion, or hydronephrosis. Bladder is unremarkable. Stomach/Bowel: Stomach is within normal limits. Appendix appears normal. No evidence of bowel wall thickening, distention, or inflammatory changes. Lymphatic: No significant vascular findings are present. No enlarged abdominal or pelvic lymph nodes. Reproductive: Uterus and bilateral adnexa are unremarkable. Other: No abdominal wall hernia or abnormality. No abdominopelvic ascites. Musculoskeletal: No acute or significant osseous findings. IMPRESSION: 1. Intraluminal arterial contrast blush, localizing diverticular hemorrhage to the distal transverse colon just proximal to the splenic flexure. 2.  Pancolonic diverticulosis. 3.  Aortic atherosclerosis. These results will be called to the ordering clinician or representative by the Radiologist Assistant, and communication documented in the PACS or [employer]. Electronically Signed   By: Eddie Candle M.D.   On: 12/19/2020 10:43    Assessment/Plan: Lower GI bleed S/p angiogram with successful embolization of a short segment of the marginal artery supplying ill defined area of extravasation within the splenic flexure of the colon Hgb up to 7.6 No plans for further IR intervention at this time. Will monitor labs and clinical status.   LOS: 2 days   I spent a total of 20 minutes in face to face in clinical consultation, greater than 50% of which was counseling/coordinating care for angio/embo of lower GI bleed  Ascencion Dike PA-C 12/20/2020 10:56 AM

## 2020-12-20 NOTE — Progress Notes (Signed)
Progress Note    [name]   OZH:[id number]  DOB: 06-Aug-1940  DOA: 12/18/2020     2  PCP: [name], MD  CC: rectal bleeding  Hospital Course: [name] is a 80 y.o. 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  She reported intermittent rectal bleeding since Friday.  This morning she had ongoing rectal bleeding when seen in the bed.  Hemoglobin 6.4 g/dL on admission and she was transfused 2 units PRBC.  Iron stores also low and she was ordered for iron transfusion.  She underwent CT angio GI bleed scan which showed an intraluminal arterial blush consistent with diverticular hemorrhage located in the distal transverse colon just proximal to the splenic flexure.  Radiology was consulted for embolization.    Interval History:  Only a small smear of blood per rectum today however hemoglobin has dropped and we are starting to transfuse her She is hungry and has a headache however I feel we should hold off until she declares herself in the next 24 hours I have had a detailed discussion with Dr. Cristina Gong who feels she should be hospitalized at least for another 24 hours-patient herself is somewhat reluctant as she has not eaten and her restless leg is acting up    ROS: Constitutional: negative for chills and fevers, Respiratory: negative for cough, Cardiovascular: negative for chest pain, and Gastrointestinal: negative for abdominal pain  Assessment & Plan:  Lower GI bleed - Patient presented with rectal bleeding since Friday.  Hemoglobin 6.4 g/dL on admission -Given 2 units PRBC and iron infusion also ordered -Transfused urine 1 more unit PRBC 6/22 - GI consulted on admission as well and following - Due to ongoing bleeding, patient underwent CT angio GI bleed scan which showed diverticular bleed.  IR consulted on 12/19/2020 for  embolization -Monitor trends of hemoglobin   Hypertension - Propranolol held from admission  COPD - No signs/symptoms of exacerbation  Anxiety/depression - Continue Xanax and Zoloft    Old records reviewed in assessment of this patient  Antimicrobials:   DVT prophylaxis: SCDs Start: 12/18/20 1813   Code Status:   Code Status: Full Code Family Communication:   Disposition Plan: Status is: Inpatient  Remains inpatient appropriate because:Hemodynamically unstable, IV treatments appropriate due to intensity of illness or inability to take PO, Inpatient level of care appropriate due to severity of illness, and GIB  Dispo: The patient is from: Home              Anticipated d/c is to: Home              Patient currently is not medically stable to d/c.   Difficult to place patient No      Risk of unplanned readmission score: Unplanned Admission- Pilot do not use: 15.91   Objective: Blood pressure 118/75, pulse (!) 111, temperature 98.6 F (37 C), temperature source Oral, resp. rate 20, weight 80.9 kg, SpO2 93 %.  Examination:  Alert pleasant coherent no distress Right arm has IV that is infiltrated and swollen Chest clear no rales no rhonchi G2-X5 holosystolic murmur Abdomen soft no rebound no guarding ROM intact Cranial nerves grossly intact moving all 4 limbs Significant right upper extremity swelling    Consultants:  GI IR  Procedures:  IR embolization: 12/19/20  Data Reviewed: I have personally reviewed following labs and imaging studies Results for orders placed  or performed during the hospital encounter of 12/18/20 (from the past 24 hour(s))  Basic metabolic panel     Status: Abnormal   Collection Time: 12/20/20 12:28 AM  Result Value Ref Range   Sodium 137 135 - 145 mmol/L   Potassium 3.2 (L) 3.5 - 5.1 mmol/L   Chloride 107 98 - 111 mmol/L   CO2 22 22 - 32 mmol/L   Glucose, Bld 118 (H) 70 - 99 mg/dL   BUN 10 8 - 23 mg/dL   Creatinine, Ser 0.49  0.44 - 1.00 mg/dL   Calcium 7.7 (L) 8.9 - 10.3 mg/dL   GFR, Estimated >60 >60 mL/min   Anion gap 8 5 - 15  Magnesium     Status: None   Collection Time: 12/20/20 12:28 AM  Result Value Ref Range   Magnesium 2.0 1.7 - 2.4 mg/dL  Hemoglobin and hematocrit, blood     Status: Abnormal   Collection Time: 12/20/20 12:28 AM  Result Value Ref Range   Hemoglobin 7.6 (L) 12.0 - 15.0 g/dL   HCT 22.5 (L) 36.0 - 46.0 %  CBC     Status: Abnormal   Collection Time: 12/20/20 11:43 AM  Result Value Ref Range   WBC 12.4 (H) 4.0 - 10.5 K/uL   RBC 2.37 (L) 3.87 - 5.11 MIL/uL   Hemoglobin 6.8 (LL) 12.0 - 15.0 g/dL   HCT 20.9 (L) 36.0 - 46.0 %   MCV 88.2 80.0 - 100.0 fL   MCH 28.7 26.0 - 34.0 pg   MCHC 32.5 30.0 - 36.0 g/dL   RDW 16.2 (H) 11.5 - 15.5 %   Platelets 145 (L) 150 - 400 K/uL   nRBC 0.5 (H) 0.0 - 0.2 %  Prepare RBC (crossmatch)     Status: None   Collection Time: 12/20/20  2:32 PM  Result Value Ref Range   Order Confirmation      ORDER PROCESSED BY BLOOD BANK Performed at [facility], [address] [place], [place] [address]     Recent Results (from the past 240 hour(s))  SARS CORONAVIRUS 2 (TAT 6-24 HRS) Nasopharyngeal Nasopharyngeal Swab     Status: None   Collection Time: 12/18/20  5:47 PM   Specimen: Nasopharyngeal Swab  Result Value Ref Range Status   SARS Coronavirus 2 NEGATIVE NEGATIVE Final    Comment: (NOTE) SARS-CoV-2 target nucleic acids are NOT DETECTED.  The SARS-CoV-2 RNA is generally detectable in upper and lower respiratory specimens during the acute phase of infection. Negative results do not preclude SARS-CoV-2 infection, do not rule out co-infections with other pathogens, and should not be used as the sole basis for treatment or other patient management decisions. Negative results must be combined with clinical observations, patient history, and epidemiological information. The expected result is Negative.  Fact Sheet for  Patients: SugarRoll.be  Fact Sheet for Healthcare Providers: https://www.woods-mathews.com/  This test is not yet approved or cleared by the [place] FDA and  has been authorized for detection and/or diagnosis of SARS-CoV-2 by FDA under an Emergency Use Authorization (EUA). This EUA will remain  in effect (meaning this test can be used) for the duration of the COVID-19 declaration under Se ction 564(b)(1) of the Act, 21 U.S.C. section 360bbb-3(b)(1), unless the authorization is terminated or revoked sooner.  Performed at [facility], [place] [address] [place], [place] [address]      Radiology Studies: IR Angiogram Visceral Selective  Result Date: 12/19/2020 INDICATION: Acute lower GI bleeding.  Positive CTA with active extravasation at the level of the splenic flexure of the colon. Please perform mesenteric arteriogram and percutaneous embolization as indicated. EXAM: 1. ULTRASOUND GUIDANCE FOR ARTERIAL ACCESS 2. SELECTIVE SUPERIOR MESENTERIC ARTERIOGRAM 3. SELECTIVE INFERIOR MESENTERIC ARTERIOGRAM 4. SUB SELECTIVE LEFT COLIC ARTERIOGRAM 5. SUB SELECTIVE MARGINAL ARTERY ARTERIOGRAM AND PERCUTANEOUS COIL EMBOLIZATION 6. SUB SELECTIVE SIGMOIDAL ARTERIOGRAM COMPARISON:  CTA abdomen and pelvis-earlier same day MEDICATIONS: None ANESTHESIA/SEDATION: Moderate (conscious) sedation was employed during this procedure. A total of Versed 1 mg and Fentanyl 25 mcg was administered intravenously. Moderate Sedation Time: 62 minutes. The patient's level of consciousness and vital signs were monitored continuously by radiology nursing throughout the procedure under my direct supervision. FLUOROSCOPY TIME:  21 minutes (5,625 mGy) COMPLICATIONS: None immediate. PROCEDURE: Informed consent was obtained from the patient following explanation of the procedure, risks, benefits and alternatives. All questions were addressed. A time out was performed prior to the  initiation of the procedure. Maximal barrier sterile technique utilized including caps, mask, sterile gowns, sterile gloves, large sterile drape, hand hygiene, and Betadine prep. The right femoral head was marked fluoroscopically. Under sterile conditions and local anesthesia, the right common femoral artery access was performed with a micropuncture needle. Under direct ultrasound guidance, the right common femoral was accessed with a micropuncture kit. An ultrasound image was saved for documentation purposes. This allowed for placement of a 5-French vascular sheath. A limited arteriogram was performed through the side arm of the sheath confirming appropriate access within the right common femoral artery. Over a Bentson wire, a Mickelson catheter was advanced the caudal aspect of the thoracic aorta where was reformed, back bled and flushed. The Mickelson catheter was then utilized to select the superior mesenteric artery and a selective superior mesenteric arteriogram was performed. The Mickelson catheter was then utilized to select the inferior mesenteric artery and a selective inferior mesenteric arteriogram was performed. Next, with the use of a fathom 14 microwire, a STC Renegade microcatheter was advanced into the left colic artery and a selective left colic arteriogram was performed. Microcatheter was advanced to the level of the marginal artery and a selective marginal artery arteriogram was performed. Microcatheter was advanced beyond the origin of a tiny distal arcade branch supplying an ill-defined area of active contrast extravasation within the splenic flexure of the colon correlating with the findings on preceding CTA. Next, a short segment of the marginal artery with percutaneously coil embolized with 2 mm and 3 mm diameter interlock coils. The microcatheter was retracted to the more central aspect of the marginal artery and a post embolization marginal arteriogram was performed. Microcatheter was  retracted to the level of the left colic artery and a post embolization left colic arteriogram was performed Next, with the use of the fathom 14 microwire, the microcatheter was utilized to select the sigmoid artery and a sub selective sigmoid arteriogram was performed. The microcatheter was advanced to the opposite side of the embolized segment of the marginal artery and a sub selective marginal arteriogram was performed. Images were reviewed and the procedure was terminated. All wires, catheters and sheaths were removed from the patient. Hemostasis was achieved at the right groin access site with deployment of an ExoSeal closure device and manual compression. Dressings were applied. The patient tolerated the procedure well without immediate post procedural complication. FINDINGS: Selective superior mesenteric arteriogram is negative for significant arterial supply to the ill-defined area of contrast extravasation within the splenic flexure of the colon. Excreted contrast is noted within a focally  dilated left superior pole calyx as was demonstrated on preceding abdominal CT. Selective inferior mesenteric arteriogram demonstrates predominant arterial supply to the splenic flexure of the colon via the left colic artery. Sub selective left colic arteriogram demonstrates an ill-defined area of contrast extravasation arising from a tiny arcade branch of the marginal artery. This finding was confirmed with sub selective marginal artery arteriogram. Ultimately, a short segment of the marginal artery was percutaneously coil embolized with overlapping interlock coils traversing the origin of the tiny distal arcade branch supplying the ill-defined area of active extravasation. Post embolization marginal artery and left colic arteriograms were negative for discrete area of residual contrast extravasation. Given robust arterial supply to the splenic flexure of the colon from the sigmoid artery, a selective sigmoid arteriogram  was performed as was a sub selective marginal artery arteriogram supplying the opposite side of the embolized segment of the vessel however both were negative for discrete area of collateral arterial supply to the ill-defined area of active extravasation within the splenic flexure of the colon. IMPRESSION: Technically successful percutaneous coil embolization of a short segment of the marginal artery supplying the origin of a tiny distal arcade which demonstrated active intraluminal contrast extravasation, compatible with the findings seen on preceding CTA. PLAN: - The patient is to remain flat for 4 hours with right leg straight. - The patient will continue to experience several additional bloody bowel movements and may continue to require additional resuscitation (as she was bleeding both before and during the procedure), however ultimately I am hopeful she will stabilize in the coming days. - If there remains clinical concern for persistent lower GI bleeding, would recommend coordinating with the interventional radiology service to perform a nuclear medicine tagged red blood cell study during daytime working hours as to expedite potential repeat angiography if the tagged study is positive. 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 Electronically Signed   By: Sandi Mariscal M.D.   On: 12/19/2020 16:01   IR Angiogram Selective Each Additional Vessel  Result Date: 12/19/2020 INDICATION: Acute lower GI bleeding. Positive CTA with active extravasation at the level of the splenic flexure of the colon. Please perform mesenteric arteriogram and percutaneous embolization as indicated. EXAM: 1. ULTRASOUND GUIDANCE FOR ARTERIAL ACCESS 2. SELECTIVE SUPERIOR MESENTERIC ARTERIOGRAM 3. SELECTIVE INFERIOR MESENTERIC ARTERIOGRAM 4. SUB SELECTIVE LEFT COLIC ARTERIOGRAM 5. SUB SELECTIVE MARGINAL  ARTERY ARTERIOGRAM AND PERCUTANEOUS COIL EMBOLIZATION 6. SUB SELECTIVE SIGMOIDAL ARTERIOGRAM COMPARISON:  CTA abdomen and pelvis-earlier same day MEDICATIONS: None ANESTHESIA/SEDATION: Moderate (conscious) sedation was employed during this procedure. A total of Versed 1 mg and Fentanyl 25 mcg was administered intravenously. Moderate Sedation Time: 62 minutes. The patient's level of consciousness and vital signs were monitored continuously by radiology nursing throughout the procedure under my direct supervision. FLUOROSCOPY TIME:  21 minutes (8,185 mGy) COMPLICATIONS: None immediate. PROCEDURE: Informed consent was obtained from the patient following explanation of the procedure, risks, benefits and alternatives. All questions were addressed. A time out was performed prior to the initiation of the procedure. Maximal barrier sterile technique utilized including caps, mask, sterile gowns, sterile gloves, large sterile drape, hand hygiene, and Betadine prep. The right femoral head was marked fluoroscopically. Under sterile conditions and local anesthesia, the right common femoral artery access was performed with a micropuncture needle. Under direct ultrasound guidance, the right common femoral was accessed with a micropuncture kit. An ultrasound image was saved  for documentation purposes. This allowed for placement of a 5-French vascular sheath. A limited arteriogram was performed through the side arm of the sheath confirming appropriate access within the right common femoral artery. Over a Bentson wire, a Mickelson catheter was advanced the caudal aspect of the thoracic aorta where was reformed, back bled and flushed. The Mickelson catheter was then utilized to select the superior mesenteric artery and a selective superior mesenteric arteriogram was performed. The Mickelson catheter was then utilized to select the inferior mesenteric artery and a selective inferior mesenteric arteriogram was performed. Next, with the  use of a fathom 14 microwire, a STC Renegade microcatheter was advanced into the left colic artery and a selective left colic arteriogram was performed. Microcatheter was advanced to the level of the marginal artery and a selective marginal artery arteriogram was performed. Microcatheter was advanced beyond the origin of a tiny distal arcade branch supplying an ill-defined area of active contrast extravasation within the splenic flexure of the colon correlating with the findings on preceding CTA. Next, a short segment of the marginal artery with percutaneously coil embolized with 2 mm and 3 mm diameter interlock coils. The microcatheter was retracted to the more central aspect of the marginal artery and a post embolization marginal arteriogram was performed. Microcatheter was retracted to the level of the left colic artery and a post embolization left colic arteriogram was performed Next, with the use of the fathom 14 microwire, the microcatheter was utilized to select the sigmoid artery and a sub selective sigmoid arteriogram was performed. The microcatheter was advanced to the opposite side of the embolized segment of the marginal artery and a sub selective marginal arteriogram was performed. Images were reviewed and the procedure was terminated. All wires, catheters and sheaths were removed from the patient. Hemostasis was achieved at the right groin access site with deployment of an ExoSeal closure device and manual compression. Dressings were applied. The patient tolerated the procedure well without immediate post procedural complication. FINDINGS: Selective superior mesenteric arteriogram is negative for significant arterial supply to the ill-defined area of contrast extravasation within the splenic flexure of the colon. Excreted contrast is noted within a focally dilated left superior pole calyx as was demonstrated on preceding abdominal CT. Selective inferior mesenteric arteriogram demonstrates predominant  arterial supply to the splenic flexure of the colon via the left colic artery. Sub selective left colic arteriogram demonstrates an ill-defined area of contrast extravasation arising from a tiny arcade branch of the marginal artery. This finding was confirmed with sub selective marginal artery arteriogram. Ultimately, a short segment of the marginal artery was percutaneously coil embolized with overlapping interlock coils traversing the origin of the tiny distal arcade branch supplying the ill-defined area of active extravasation. Post embolization marginal artery and left colic arteriograms were negative for discrete area of residual contrast extravasation. Given robust arterial supply to the splenic flexure of the colon from the sigmoid artery, a selective sigmoid arteriogram was performed as was a sub selective marginal artery arteriogram supplying the opposite side of the embolized segment of the vessel however both were negative for discrete area of collateral arterial supply to the ill-defined area of active extravasation within the splenic flexure of the colon. IMPRESSION: Technically successful percutaneous coil embolization of a short segment of the marginal artery supplying the origin of a tiny distal arcade which demonstrated active intraluminal contrast extravasation, compatible with the findings seen on preceding CTA. PLAN: - The patient is to remain flat for 4 hours with right  leg straight. - The patient will continue to experience several additional bloody bowel movements and may continue to require additional resuscitation (as she was bleeding both before and during the procedure), however ultimately I am hopeful she will stabilize in the coming days. - If there remains clinical concern for persistent lower GI bleeding, would recommend coordinating with the interventional radiology service to perform a nuclear medicine tagged red blood cell study during daytime working hours as to expedite potential  repeat angiography if the tagged study is positive. 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 Electronically Signed   By: Sandi Mariscal M.D.   On: 12/19/2020 16:01   IR Angiogram Selective Each Additional Vessel  Result Date: 12/19/2020 INDICATION: Acute lower GI bleeding. Positive CTA with active extravasation at the level of the splenic flexure of the colon. Please perform mesenteric arteriogram and percutaneous embolization as indicated. EXAM: 1. ULTRASOUND GUIDANCE FOR ARTERIAL ACCESS 2. SELECTIVE SUPERIOR MESENTERIC ARTERIOGRAM 3. SELECTIVE INFERIOR MESENTERIC ARTERIOGRAM 4. SUB SELECTIVE LEFT COLIC ARTERIOGRAM 5. SUB SELECTIVE MARGINAL ARTERY ARTERIOGRAM AND PERCUTANEOUS COIL EMBOLIZATION 6. SUB SELECTIVE SIGMOIDAL ARTERIOGRAM COMPARISON:  CTA abdomen and pelvis-earlier same day MEDICATIONS: None ANESTHESIA/SEDATION: Moderate (conscious) sedation was employed during this procedure. A total of Versed 1 mg and Fentanyl 25 mcg was administered intravenously. Moderate Sedation Time: 62 minutes. The patient's level of consciousness and vital signs were monitored continuously by radiology nursing throughout the procedure under my direct supervision. FLUOROSCOPY TIME:  21 minutes (1,027 mGy) COMPLICATIONS: None immediate. PROCEDURE: Informed consent was obtained from the patient following explanation of the procedure, risks, benefits and alternatives. All questions were addressed. A time out was performed prior to the initiation of the procedure. Maximal barrier sterile technique utilized including caps, mask, sterile gowns, sterile gloves, large sterile drape, hand hygiene, and Betadine prep. The right femoral head was marked fluoroscopically. Under sterile conditions and local anesthesia, the right common femoral artery access was performed with a micropuncture needle. Under  direct ultrasound guidance, the right common femoral was accessed with a micropuncture kit. An ultrasound image was saved for documentation purposes. This allowed for placement of a 5-French vascular sheath. A limited arteriogram was performed through the side arm of the sheath confirming appropriate access within the right common femoral artery. Over a Bentson wire, a Mickelson catheter was advanced the caudal aspect of the thoracic aorta where was reformed, back bled and flushed. The Mickelson catheter was then utilized to select the superior mesenteric artery and a selective superior mesenteric arteriogram was performed. The Mickelson catheter was then utilized to select the inferior mesenteric artery and a selective inferior mesenteric arteriogram was performed. Next, with the use of a fathom 14 microwire, a STC Renegade microcatheter was advanced into the left colic artery and a selective left colic arteriogram was performed. Microcatheter was advanced to the level of the marginal artery and a selective marginal artery arteriogram was performed. Microcatheter was advanced beyond the origin of a tiny distal arcade branch supplying an ill-defined area of active contrast extravasation within the splenic flexure of the colon correlating with the findings on preceding CTA. Next, a short segment of the marginal artery with percutaneously coil embolized with 2 mm and 3 mm diameter interlock coils. The microcatheter was retracted to the more central aspect of the marginal artery and a post embolization marginal arteriogram was performed. Microcatheter was retracted to the level of the left  colic artery and a post embolization left colic arteriogram was performed Next, with the use of the fathom 14 microwire, the microcatheter was utilized to select the sigmoid artery and a sub selective sigmoid arteriogram was performed. The microcatheter was advanced to the opposite side of the embolized segment of the marginal artery  and a sub selective marginal arteriogram was performed. Images were reviewed and the procedure was terminated. All wires, catheters and sheaths were removed from the patient. Hemostasis was achieved at the right groin access site with deployment of an ExoSeal closure device and manual compression. Dressings were applied. The patient tolerated the procedure well without immediate post procedural complication. FINDINGS: Selective superior mesenteric arteriogram is negative for significant arterial supply to the ill-defined area of contrast extravasation within the splenic flexure of the colon. Excreted contrast is noted within a focally dilated left superior pole calyx as was demonstrated on preceding abdominal CT. Selective inferior mesenteric arteriogram demonstrates predominant arterial supply to the splenic flexure of the colon via the left colic artery. Sub selective left colic arteriogram demonstrates an ill-defined area of contrast extravasation arising from a tiny arcade branch of the marginal artery. This finding was confirmed with sub selective marginal artery arteriogram. Ultimately, a short segment of the marginal artery was percutaneously coil embolized with overlapping interlock coils traversing the origin of the tiny distal arcade branch supplying the ill-defined area of active extravasation. Post embolization marginal artery and left colic arteriograms were negative for discrete area of residual contrast extravasation. Given robust arterial supply to the splenic flexure of the colon from the sigmoid artery, a selective sigmoid arteriogram was performed as was a sub selective marginal artery arteriogram supplying the opposite side of the embolized segment of the vessel however both were negative for discrete area of collateral arterial supply to the ill-defined area of active extravasation within the splenic flexure of the colon. IMPRESSION: Technically successful percutaneous coil embolization of a  short segment of the marginal artery supplying the origin of a tiny distal arcade which demonstrated active intraluminal contrast extravasation, compatible with the findings seen on preceding CTA. PLAN: - The patient is to remain flat for 4 hours with right leg straight. - The patient will continue to experience several additional bloody bowel movements and may continue to require additional resuscitation (as she was bleeding both before and during the procedure), however ultimately I am hopeful she will stabilize in the coming days. - If there remains clinical concern for persistent lower GI bleeding, would recommend coordinating with the interventional radiology service to perform a nuclear medicine tagged red blood cell study during daytime working hours as to expedite potential repeat angiography if the tagged study is positive. 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 Electronically Signed   By: Sandi Mariscal M.D.   On: 12/19/2020 16:01   IR Angiogram Follow Up Study  Result Date: 12/19/2020 INDICATION: Acute lower GI bleeding. Positive CTA with active extravasation at the level of the splenic flexure of the colon. Please perform mesenteric arteriogram and percutaneous embolization as indicated. EXAM: 1. ULTRASOUND GUIDANCE FOR ARTERIAL ACCESS 2. SELECTIVE SUPERIOR MESENTERIC ARTERIOGRAM 3. SELECTIVE INFERIOR MESENTERIC ARTERIOGRAM 4. SUB SELECTIVE LEFT COLIC ARTERIOGRAM 5. SUB SELECTIVE MARGINAL ARTERY ARTERIOGRAM AND PERCUTANEOUS COIL EMBOLIZATION 6. SUB SELECTIVE SIGMOIDAL ARTERIOGRAM COMPARISON:  CTA abdomen and pelvis-earlier same day MEDICATIONS: None ANESTHESIA/SEDATION: Moderate (conscious) sedation was employed during this procedure. A total of  Versed 1 mg and Fentanyl 25 mcg was administered intravenously. Moderate Sedation Time: 62 minutes. The patient's level of  consciousness and vital signs were monitored continuously by radiology nursing throughout the procedure under my direct supervision. FLUOROSCOPY TIME:  21 minutes (3,614 mGy) COMPLICATIONS: None immediate. PROCEDURE: Informed consent was obtained from the patient following explanation of the procedure, risks, benefits and alternatives. All questions were addressed. A time out was performed prior to the initiation of the procedure. Maximal barrier sterile technique utilized including caps, mask, sterile gowns, sterile gloves, large sterile drape, hand hygiene, and Betadine prep. The right femoral head was marked fluoroscopically. Under sterile conditions and local anesthesia, the right common femoral artery access was performed with a micropuncture needle. Under direct ultrasound guidance, the right common femoral was accessed with a micropuncture kit. An ultrasound image was saved for documentation purposes. This allowed for placement of a 5-French vascular sheath. A limited arteriogram was performed through the side arm of the sheath confirming appropriate access within the right common femoral artery. Over a Bentson wire, a Mickelson catheter was advanced the caudal aspect of the thoracic aorta where was reformed, back bled and flushed. The Mickelson catheter was then utilized to select the superior mesenteric artery and a selective superior mesenteric arteriogram was performed. The Mickelson catheter was then utilized to select the inferior mesenteric artery and a selective inferior mesenteric arteriogram was performed. Next, with the use of a fathom 14 microwire, a STC Renegade microcatheter was advanced into the left colic artery and a selective left colic arteriogram was performed. Microcatheter was advanced to the level of the marginal artery and a selective marginal artery arteriogram was performed. Microcatheter was advanced beyond the origin of a tiny distal arcade branch supplying an ill-defined area of  active contrast extravasation within the splenic flexure of the colon correlating with the findings on preceding CTA. Next, a short segment of the marginal artery with percutaneously coil embolized with 2 mm and 3 mm diameter interlock coils. The microcatheter was retracted to the more central aspect of the marginal artery and a post embolization marginal arteriogram was performed. Microcatheter was retracted to the level of the left colic artery and a post embolization left colic arteriogram was performed Next, with the use of the fathom 14 microwire, the microcatheter was utilized to select the sigmoid artery and a sub selective sigmoid arteriogram was performed. The microcatheter was advanced to the opposite side of the embolized segment of the marginal artery and a sub selective marginal arteriogram was performed. Images were reviewed and the procedure was terminated. All wires, catheters and sheaths were removed from the patient. Hemostasis was achieved at the right groin access site with deployment of an ExoSeal closure device and manual compression. Dressings were applied. The patient tolerated the procedure well without immediate post procedural complication. FINDINGS: Selective superior mesenteric arteriogram is negative for significant arterial supply to the ill-defined area of contrast extravasation within the splenic flexure of the colon. Excreted contrast is noted within a focally dilated left superior pole calyx as was demonstrated on preceding abdominal CT. Selective inferior mesenteric arteriogram demonstrates predominant arterial supply to the splenic flexure of the colon via the left colic artery. Sub selective left colic arteriogram demonstrates an ill-defined area of contrast extravasation arising from a tiny arcade branch of the marginal artery. This finding was confirmed with sub selective marginal artery arteriogram. Ultimately, a short segment of the marginal artery was percutaneously coil  embolized with overlapping interlock coils traversing the origin  of the tiny distal arcade branch supplying the ill-defined area of active extravasation. Post embolization marginal artery and left colic arteriograms were negative for discrete area of residual contrast extravasation. Given robust arterial supply to the splenic flexure of the colon from the sigmoid artery, a selective sigmoid arteriogram was performed as was a sub selective marginal artery arteriogram supplying the opposite side of the embolized segment of the vessel however both were negative for discrete area of collateral arterial supply to the ill-defined area of active extravasation within the splenic flexure of the colon. IMPRESSION: Technically successful percutaneous coil embolization of a short segment of the marginal artery supplying the origin of a tiny distal arcade which demonstrated active intraluminal contrast extravasation, compatible with the findings seen on preceding CTA. PLAN: - The patient is to remain flat for 4 hours with right leg straight. - The patient will continue to experience several additional bloody bowel movements and may continue to require additional resuscitation (as she was bleeding both before and during the procedure), however ultimately I am hopeful she will stabilize in the coming days. - If there remains clinical concern for persistent lower GI bleeding, would recommend coordinating with the interventional radiology service to perform a nuclear medicine tagged red blood cell study during daytime working hours as to expedite potential repeat angiography if the tagged study is positive. 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 Electronically Signed   By: Sandi Mariscal M.D.   On: 12/19/2020 16:01   IR US Guide Vasc Access Right  Result Date: 12/19/2020 INDICATION: Acute lower  GI bleeding. Positive CTA with active extravasation at the level of the splenic flexure of the colon. Please perform mesenteric arteriogram and percutaneous embolization as indicated. EXAM: 1. ULTRASOUND GUIDANCE FOR ARTERIAL ACCESS 2. SELECTIVE SUPERIOR MESENTERIC ARTERIOGRAM 3. SELECTIVE INFERIOR MESENTERIC ARTERIOGRAM 4. SUB SELECTIVE LEFT COLIC ARTERIOGRAM 5. SUB SELECTIVE MARGINAL ARTERY ARTERIOGRAM AND PERCUTANEOUS COIL EMBOLIZATION 6. SUB SELECTIVE SIGMOIDAL ARTERIOGRAM COMPARISON:  CTA abdomen and pelvis-earlier same day MEDICATIONS: None ANESTHESIA/SEDATION: Moderate (conscious) sedation was employed during this procedure. A total of Versed 1 mg and Fentanyl 25 mcg was administered intravenously. Moderate Sedation Time: 62 minutes. The patient's level of consciousness and vital signs were monitored continuously by radiology nursing throughout the procedure under my direct supervision. FLUOROSCOPY TIME:  21 minutes (1,941 mGy) COMPLICATIONS: None immediate. PROCEDURE: Informed consent was obtained from the patient following explanation of the procedure, risks, benefits and alternatives. All questions were addressed. A time out was performed prior to the initiation of the procedure. Maximal barrier sterile technique utilized including caps, mask, sterile gowns, sterile gloves, large sterile drape, hand hygiene, and Betadine prep. The right femoral head was marked fluoroscopically. Under sterile conditions and local anesthesia, the right common femoral artery access was performed with a micropuncture needle. Under direct ultrasound guidance, the right common femoral was accessed with a micropuncture kit. An ultrasound image was saved for documentation purposes. This allowed for placement of a 5-French vascular sheath. A limited arteriogram was performed through the side arm of the sheath confirming appropriate access within the right common femoral artery. Over a Bentson wire, a Mickelson catheter was advanced  the caudal aspect of the thoracic aorta where was reformed, back bled and flushed. The Mickelson catheter was then utilized to select the superior mesenteric artery and a selective superior mesenteric arteriogram was performed. The Mickelson catheter was then utilized to  select the inferior mesenteric artery and a selective inferior mesenteric arteriogram was performed. Next, with the use of a fathom 14 microwire, a STC Renegade microcatheter was advanced into the left colic artery and a selective left colic arteriogram was performed. Microcatheter was advanced to the level of the marginal artery and a selective marginal artery arteriogram was performed. Microcatheter was advanced beyond the origin of a tiny distal arcade branch supplying an ill-defined area of active contrast extravasation within the splenic flexure of the colon correlating with the findings on preceding CTA. Next, a short segment of the marginal artery with percutaneously coil embolized with 2 mm and 3 mm diameter interlock coils. The microcatheter was retracted to the more central aspect of the marginal artery and a post embolization marginal arteriogram was performed. Microcatheter was retracted to the level of the left colic artery and a post embolization left colic arteriogram was performed Next, with the use of the fathom 14 microwire, the microcatheter was utilized to select the sigmoid artery and a sub selective sigmoid arteriogram was performed. The microcatheter was advanced to the opposite side of the embolized segment of the marginal artery and a sub selective marginal arteriogram was performed. Images were reviewed and the procedure was terminated. All wires, catheters and sheaths were removed from the patient. Hemostasis was achieved at the right groin access site with deployment of an ExoSeal closure device and manual compression. Dressings were applied. The patient tolerated the procedure well without immediate post procedural  complication. FINDINGS: Selective superior mesenteric arteriogram is negative for significant arterial supply to the ill-defined area of contrast extravasation within the splenic flexure of the colon. Excreted contrast is noted within a focally dilated left superior pole calyx as was demonstrated on preceding abdominal CT. Selective inferior mesenteric arteriogram demonstrates predominant arterial supply to the splenic flexure of the colon via the left colic artery. Sub selective left colic arteriogram demonstrates an ill-defined area of contrast extravasation arising from a tiny arcade branch of the marginal artery. This finding was confirmed with sub selective marginal artery arteriogram. Ultimately, a short segment of the marginal artery was percutaneously coil embolized with overlapping interlock coils traversing the origin of the tiny distal arcade branch supplying the ill-defined area of active extravasation. Post embolization marginal artery and left colic arteriograms were negative for discrete area of residual contrast extravasation. Given robust arterial supply to the splenic flexure of the colon from the sigmoid artery, a selective sigmoid arteriogram was performed as was a sub selective marginal artery arteriogram supplying the opposite side of the embolized segment of the vessel however both were negative for discrete area of collateral arterial supply to the ill-defined area of active extravasation within the splenic flexure of the colon. IMPRESSION: Technically successful percutaneous coil embolization of a short segment of the marginal artery supplying the origin of a tiny distal arcade which demonstrated active intraluminal contrast extravasation, compatible with the findings seen on preceding CTA. PLAN: - The patient is to remain flat for 4 hours with right leg straight. - The patient will continue to experience several additional bloody bowel movements and may continue to require additional  resuscitation (as she was bleeding both before and during the procedure), however ultimately I am hopeful she will stabilize in the coming days. - If there remains clinical concern for persistent lower GI bleeding, would recommend coordinating with the interventional radiology service to perform a nuclear medicine tagged red blood cell study during daytime working hours as to expedite potential repeat angiography if  the tagged study is positive. 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 Electronically Signed   By: Sandi Mariscal M.D.   On: 12/19/2020 16:01   IR EMBO ART  VEN HEMORR LYMPH EXTRAV  INC GUIDE ROADMAPPING  Result Date: 12/19/2020 INDICATION: Acute lower GI bleeding. Positive CTA with active extravasation at the level of the splenic flexure of the colon. Please perform mesenteric arteriogram and percutaneous embolization as indicated. EXAM: 1. ULTRASOUND GUIDANCE FOR ARTERIAL ACCESS 2. SELECTIVE SUPERIOR MESENTERIC ARTERIOGRAM 3. SELECTIVE INFERIOR MESENTERIC ARTERIOGRAM 4. SUB SELECTIVE LEFT COLIC ARTERIOGRAM 5. SUB SELECTIVE MARGINAL ARTERY ARTERIOGRAM AND PERCUTANEOUS COIL EMBOLIZATION 6. SUB SELECTIVE SIGMOIDAL ARTERIOGRAM COMPARISON:  CTA abdomen and pelvis-earlier same day MEDICATIONS: None ANESTHESIA/SEDATION: Moderate (conscious) sedation was employed during this procedure. A total of Versed 1 mg and Fentanyl 25 mcg was administered intravenously. Moderate Sedation Time: 62 minutes. The patient's level of consciousness and vital signs were monitored continuously by radiology nursing throughout the procedure under my direct supervision. FLUOROSCOPY TIME:  21 minutes (7,672 mGy) COMPLICATIONS: None immediate. PROCEDURE: Informed consent was obtained from the patient following explanation of the procedure, risks, benefits and alternatives. All questions were  addressed. A time out was performed prior to the initiation of the procedure. Maximal barrier sterile technique utilized including caps, mask, sterile gowns, sterile gloves, large sterile drape, hand hygiene, and Betadine prep. The right femoral head was marked fluoroscopically. Under sterile conditions and local anesthesia, the right common femoral artery access was performed with a micropuncture needle. Under direct ultrasound guidance, the right common femoral was accessed with a micropuncture kit. An ultrasound image was saved for documentation purposes. This allowed for placement of a 5-French vascular sheath. A limited arteriogram was performed through the side arm of the sheath confirming appropriate access within the right common femoral artery. Over a Bentson wire, a Mickelson catheter was advanced the caudal aspect of the thoracic aorta where was reformed, back bled and flushed. The Mickelson catheter was then utilized to select the superior mesenteric artery and a selective superior mesenteric arteriogram was performed. The Mickelson catheter was then utilized to select the inferior mesenteric artery and a selective inferior mesenteric arteriogram was performed. Next, with the use of a fathom 14 microwire, a STC Renegade microcatheter was advanced into the left colic artery and a selective left colic arteriogram was performed. Microcatheter was advanced to the level of the marginal artery and a selective marginal artery arteriogram was performed. Microcatheter was advanced beyond the origin of a tiny distal arcade branch supplying an ill-defined area of active contrast extravasation within the splenic flexure of the colon correlating with the findings on preceding CTA. Next, a short segment of the marginal artery with percutaneously coil embolized with 2 mm and 3 mm diameter interlock coils. The microcatheter was retracted to the more central aspect of the marginal artery and a post embolization marginal  arteriogram was performed. Microcatheter was retracted to the level of the left colic artery and a post embolization left colic arteriogram was performed Next, with the use of the fathom 14 microwire, the microcatheter was utilized to select the sigmoid artery and a sub selective sigmoid arteriogram was performed. The microcatheter was advanced to the opposite side of the embolized segment of the marginal artery and a sub selective marginal arteriogram was performed. Images were reviewed and the procedure was terminated. All wires, catheters and sheaths were removed from  the patient. Hemostasis was achieved at the right groin access site with deployment of an ExoSeal closure device and manual compression. Dressings were applied. The patient tolerated the procedure well without immediate post procedural complication. FINDINGS: Selective superior mesenteric arteriogram is negative for significant arterial supply to the ill-defined area of contrast extravasation within the splenic flexure of the colon. Excreted contrast is noted within a focally dilated left superior pole calyx as was demonstrated on preceding abdominal CT. Selective inferior mesenteric arteriogram demonstrates predominant arterial supply to the splenic flexure of the colon via the left colic artery. Sub selective left colic arteriogram demonstrates an ill-defined area of contrast extravasation arising from a tiny arcade branch of the marginal artery. This finding was confirmed with sub selective marginal artery arteriogram. Ultimately, a short segment of the marginal artery was percutaneously coil embolized with overlapping interlock coils traversing the origin of the tiny distal arcade branch supplying the ill-defined area of active extravasation. Post embolization marginal artery and left colic arteriograms were negative for discrete area of residual contrast extravasation. Given robust arterial supply to the splenic flexure of the colon from the  sigmoid artery, a selective sigmoid arteriogram was performed as was a sub selective marginal artery arteriogram supplying the opposite side of the embolized segment of the vessel however both were negative for discrete area of collateral arterial supply to the ill-defined area of active extravasation within the splenic flexure of the colon. IMPRESSION: Technically successful percutaneous coil embolization of a short segment of the marginal artery supplying the origin of a tiny distal arcade which demonstrated active intraluminal contrast extravasation, compatible with the findings seen on preceding CTA. PLAN: - The patient is to remain flat for 4 hours with right leg straight. - The patient will continue to experience several additional bloody bowel movements and may continue to require additional resuscitation (as she was bleeding both before and during the procedure), however ultimately I am hopeful she will stabilize in the coming days. - If there remains clinical concern for persistent lower GI bleeding, would recommend coordinating with the interventional radiology service to perform a nuclear medicine tagged red blood cell study during daytime working hours as to expedite potential repeat angiography if the tagged study is positive. 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 Electronically Signed   By: Sandi Mariscal M.D.   On: 12/19/2020 16:01   CT ANGIO GI BLEED  Result Date: 12/19/2020 CLINICAL DATA:  Diverticulosis, recurrent diverticular hemorrhage, continued hematochezia EXAM: CTA ABDOMEN AND PELVIS WITHOUT AND WITH CONTRAST TECHNIQUE: Multidetector CT imaging of the abdomen and pelvis was performed using the standard protocol during bolus administration of intravenous contrast. Multiplanar reconstructed images and MIPs were obtained and reviewed to evaluate the vascular  anatomy. CONTRAST:  156m OMNIPAQUE IOHEXOL 350 MG/ML SOLN COMPARISON:  CT pelvis, 08/17/2018 FINDINGS: VASCULAR Moderate calcific aortic atherosclerosis. Normal contour and caliber of the abdominal aorta. 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 Otherwise standard branching pattern of the abdominal aorta. Atherosclerosis at the branch vessel origins without significant stenosis. There is an intraluminal arterial contrast blush within the distal transverse colon, just proximal to the splenic flexure, with multiple diverticula in this vicinity (series 7, image 36, series 12, image 66). Review of the MIP images confirms the above findings. NON-VASCULAR Lower  chest: No acute abnormality. Hepatobiliary: No focal liver abnormality is seen. No gallstones, gallbladder wall thickening, or biliary dilatation. Pancreas: Unremarkable. No pancreatic ductal dilatation or surrounding inflammatory changes. Spleen: Normal in size without focal abnormality. Adrenals/Urinary Tract: Adrenal glands are unremarkable. Left-sided parapelvic cyst. Kidneys are otherwise normal, without renal calculi, solid lesion, or hydronephrosis. Bladder is unremarkable. Stomach/Bowel: Stomach is within normal limits. Appendix appears normal. No evidence of bowel wall thickening, distention, or inflammatory changes. Lymphatic: No significant vascular findings are present. No enlarged abdominal or pelvic lymph nodes. Reproductive: Uterus and bilateral adnexa are unremarkable. Other: No abdominal wall hernia or abnormality. No abdominopelvic ascites. Musculoskeletal: No acute or significant osseous findings. IMPRESSION: 1. Intraluminal arterial contrast blush, localizing diverticular hemorrhage to the distal transverse colon just proximal to the splenic flexure. 2.  Pancolonic diverticulosis. 3.  Aortic  atherosclerosis. These results will be called to the ordering clinician or representative by the Radiologist Assistant, and communication documented in the PACS or [employer]. Electronically Signed   By: Eddie Candle M.D.   On: 12/19/2020 10:43   IR EMBO ART  VEN HEMORR LYMPH EXTRAV  INC GUIDE ROADMAPPING  Final Result    IR US Guide Vasc Access Right  Final Result    IR Angiogram Visceral Selective  Final Result    IR Angiogram Selective Each Additional Vessel  Final Result    IR Angiogram Selective Each Additional Vessel  Final Result    IR Angiogram Follow Up Study  Final Result    CT ANGIO GI BLEED  Final Result      Scheduled Meds:  diclofenac Sodium  2 g Topical QID   dorzolamide-timolol  1 drop Both Eyes BID   DULoxetine  20 mg Oral Daily   guaiFENesin  600 mg Oral BID   latanoprost  1 drop Both Eyes QHS   loratadine  10 mg Oral Daily   pantoprazole  40 mg Oral Daily   polyethylene glycol  17 g Oral Daily   potassium chloride  40 mEq Oral Daily   sertraline  100 mg Oral Daily   simvastatin  20 mg Oral Daily   PRN Meds: acetaminophen, albuterol, ALPRAZolam Continuous Infusions:  sodium chloride 50 mL/hr at 12/20/20 1723     LOS: 2 days  Time spent: Greater than 50% of the 35 minute visit was spent in counseling/coordination of care for the patient as laid out in the A&P.   Nita Sells, MD [facility] 12/20/2020, 5:41 PM

## 2020-12-21 ENCOUNTER — Encounter (INDEPENDENT_AMBULATORY_CARE_PROVIDER_SITE_OTHER): Payer: Medicare HMO | Admitting: Ophthalmology

## 2020-12-21 LAB — CBC
HCT: 25.5 % — ABNORMAL LOW (ref 36.0–46.0)
HCT: 25 % — ABNORMAL LOW (ref 36.0–46.0)
HCT: 23.9 % — ABNORMAL LOW (ref 36.0–46.0)
Hemoglobin: 8.3 g/dL — ABNORMAL LOW (ref 12.0–15.0)
Hemoglobin: 8.2 g/dL — ABNORMAL LOW (ref 12.0–15.0)
Hemoglobin: 7.9 g/dL — ABNORMAL LOW (ref 12.0–15.0)
MCH: 28.8 pg (ref 26.0–34.0)
MCH: 28.9 pg (ref 26.0–34.0)
MCH: 28.8 pg (ref 26.0–34.0)
MCHC: 32.5 g/dL (ref 30.0–36.0)
MCHC: 32.8 g/dL (ref 30.0–36.0)
MCHC: 33.1 g/dL (ref 30.0–36.0)
MCV: 88.5 fL (ref 80.0–100.0)
MCV: 88 fL (ref 80.0–100.0)
MCV: 87.2 fL (ref 80.0–100.0)
Platelets: 133 10*3/uL — ABNORMAL LOW (ref 150–400)
Platelets: 144 10*3/uL — ABNORMAL LOW (ref 150–400)
Platelets: 144 10*3/uL — ABNORMAL LOW (ref 150–400)
RBC: 2.88 MIL/uL — ABNORMAL LOW (ref 3.87–5.11)
RBC: 2.84 MIL/uL — ABNORMAL LOW (ref 3.87–5.11)
RBC: 2.74 MIL/uL — ABNORMAL LOW (ref 3.87–5.11)
RDW: 15.9 % — ABNORMAL HIGH (ref 11.5–15.5)
RDW: 16 % — ABNORMAL HIGH (ref 11.5–15.5)
RDW: 16.2 % — ABNORMAL HIGH (ref 11.5–15.5)
WBC: 13.4 10*3/uL — ABNORMAL HIGH (ref 4.0–10.5)
WBC: 11.9 10*3/uL — ABNORMAL HIGH (ref 4.0–10.5)
WBC: 10.7 10*3/uL — ABNORMAL HIGH (ref 4.0–10.5)
nRBC: 0.2 % (ref 0.0–0.2)
nRBC: 0.3 % — ABNORMAL HIGH (ref 0.0–0.2)
nRBC: 0.4 % — ABNORMAL HIGH (ref 0.0–0.2)

## 2020-12-21 LAB — BPAM RBC
Blood Product Expiration Date: 202206272359
Blood Product Expiration Date: 202207232359
Blood Product Expiration Date: 202207242359
Blood Product Expiration Date: 202207272359
ISSUE DATE / TIME: 202206201729
ISSUE DATE / TIME: 202206202105
ISSUE DATE / TIME: 202206211701
ISSUE DATE / TIME: 202206221659
Unit Type and Rh: 5100
Unit Type and Rh: 5100
Unit Type and Rh: 5100
Unit Type and Rh: 5100

## 2020-12-21 LAB — TYPE AND SCREEN
ABO/RH(D): O POS
Antibody Screen: NEGATIVE
Unit division: 0
Unit division: 0
Unit division: 0
Unit division: 0

## 2020-12-21 LAB — MAGNESIUM: Magnesium: 1.9 mg/dL (ref 1.7–2.4)

## 2020-12-21 LAB — BASIC METABOLIC PANEL WITH GFR
Anion gap: 7 (ref 5–15)
BUN: 7 mg/dL — ABNORMAL LOW (ref 8–23)
CO2: 24 mmol/L (ref 22–32)
Calcium: 7.8 mg/dL — ABNORMAL LOW (ref 8.9–10.3)
Chloride: 107 mmol/L (ref 98–111)
Creatinine, Ser: 0.5 mg/dL (ref 0.44–1.00)
GFR, Estimated: >60 mL/min
Glucose, Bld: 108 mg/dL — ABNORMAL HIGH (ref 70–99)
Potassium: 3.2 mmol/L — ABNORMAL LOW (ref 3.5–5.1)
Sodium: 138 mmol/L (ref 135–145)

## 2020-12-21 MED ORDER — KCL IN DEXTROSE-NACL 40-5-0.9 MEQ/L-%-% IV SOLN
INTRAVENOUS | Status: DC
  Filled 2020-12-21 (×3): qty 1000

## 2020-12-21 MED ORDER — POTASSIUM CHLORIDE 2 MEQ/ML IV SOLN: INTRAVENOUS | Status: DC

## 2020-12-21 NOTE — Progress Notes (Signed)
Progress Note    [name]   YQI:[id number]  DOB: December 11, 1940  DOA: 12/18/2020     3  PCP: [name], MD  CC: rectal bleeding  Hospital Course: 80 y.o. 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   Iron stores also low and she was ordered for iron transfusion.  6/21 CT angio GI scan = intraluminal arterial blush consistent with diverticular hemorrhage located in the distal transverse colon proximal to the splenic flexure.  6/21 radiology was consulted for embolization. 6/22 stabilized but then dark red blood from rectum hemoglobin dropped again--Transfused 1 more unit--GI made aware and reconsulted-discussion with them reveals probably no other IR intervention at this time  Interval History:   Transferred to 2 W. She is comfortable No reports of any bleeding No chest pain no fever Mild positional vertigo and the usual headaches but otherwise is fair   ROS: Constitutional: negative for chills and fevers, Respiratory: negative for cough, Cardiovascular: negative for chest pain, and Gastrointestinal: negative for abdominal pain  Assessment & Plan:  Lower GI bleed secondary to diverticular bleed most likely -hemoglobin 6.4 g/dL on admission -Given 2 units PRBC and iron infusion also ordered -Transfused urine 1 more unit PRBC 6/22 -Further management as per [facility] Dr. Cristina Gong and appreciate IR input -Clear liquid diet at dinner -If rebleeds would consider CT angiogram however at this time monitor clinically  Hypertension - Propranolol held from admission  Leukocytosis not otherwise specified - Monitor trends for fever gross diarrhea etc.?  Could  be infectious etiology  COPD - No signs/symptoms of exacerbation  Anxiety/depression - Continue Xanax and Zoloft  Persistent hyperkalemia - Continue K. Dur 40 daily and add D5 +40 of K at 75 cc/H, magnesium okay 1.9  Antimicrobials: None currently DVT prophylaxis: SCDs Start: 12/18/20 1813 Code Status:   Code Status: Full Code Family Communication: None present patient wants to update them herself  Disposition Plan: Status is: Inpatient  Remains inpatient appropriate because:Hemodynamically unstable, IV treatments appropriate due to intensity of illness or inability to take PO, Inpatient level of care appropriate due to severity of illness, and GIB  Dispo: The patient is from: Home              Anticipated d/c is to: Home              Patient currently is not medically stable to d/c.   Difficult to place patient No      Risk of unplanned readmission score: Unplanned Admission- Pilot do not use: 15.7   Objective: Blood pressure 101/67, pulse 89, temperature 98 F (36.7 C), temperature source Axillary, resp. rate 20, height _0  (1.575 m), weight 82.7 kg, SpO2 98 %.  Examination:  Sitting up in chair no distress keeps eyes closed but opens and is oriented S1-S2 no murmur Telemetry reviewed sinus rhythm with some PVCs Abdomen soft no rebound no guarding no lower extremity edema Now has Foley catheter Chest clear no added sound no rales no rhonchi Neurologically intact moving all 4 limbs    Consultants:  GI IR  Procedures:  IR embolization: 12/19/20  Data Reviewed: I have personally reviewed following labs and imaging studies Results for orders placed or performed during  the hospital encounter of 12/18/20 (from the past 24 hour(s))  CBC     Status: Abnormal   Collection Time: 12/20/20 11:43 AM  Result Value Ref Range   WBC 12.4 (H) 4.0 - 10.5 K/uL   RBC 2.37 (L) 3.87 - 5.11 MIL/uL   Hemoglobin 6.8 (LL) 12.0 - 15.0 g/dL   HCT 20.9 (L) 36.0 - 46.0 %   MCV  88.2 80.0 - 100.0 fL   MCH 28.7 26.0 - 34.0 pg   MCHC 32.5 30.0 - 36.0 g/dL   RDW 16.2 (H) 11.5 - 15.5 %   Platelets 145 (L) 150 - 400 K/uL   nRBC 0.5 (H) 0.0 - 0.2 %  Prepare RBC (crossmatch)     Status: None   Collection Time: 12/20/20  2:32 PM  Result Value Ref Range   Order Confirmation      ORDER PROCESSED BY BLOOD BANK Performed at [facility], [address] [place], [place] [address]   Basic metabolic panel     Status: Abnormal   Collection Time: 12/21/20  1:41 AM  Result Value Ref Range   Sodium 138 135 - 145 mmol/L   Potassium 3.2 (L) 3.5 - 5.1 mmol/L   Chloride 107 98 - 111 mmol/L   CO2 24 22 - 32 mmol/L   Glucose, Bld 108 (H) 70 - 99 mg/dL   BUN 7 (L) 8 - 23 mg/dL   Creatinine, Ser 0.50 0.44 - 1.00 mg/dL   Calcium 7.8 (L) 8.9 - 10.3 mg/dL   GFR, Estimated >60 >60 mL/min   Anion gap 7 5 - 15  Magnesium     Status: None   Collection Time: 12/21/20  1:41 AM  Result Value Ref Range   Magnesium 1.9 1.7 - 2.4 mg/dL  CBC     Status: Abnormal   Collection Time: 12/21/20  1:41 AM  Result Value Ref Range   WBC 13.4 (H) 4.0 - 10.5 K/uL   RBC 2.88 (L) 3.87 - 5.11 MIL/uL   Hemoglobin 8.3 (L) 12.0 - 15.0 g/dL   HCT 25.5 (L) 36.0 - 46.0 %   MCV 88.5 80.0 - 100.0 fL   MCH 28.8 26.0 - 34.0 pg   MCHC 32.5 30.0 - 36.0 g/dL   RDW 15.9 (H) 11.5 - 15.5 %   Platelets 133 (L) 150 - 400 K/uL   nRBC 0.2 0.0 - 0.2 %    Recent Results (from the past 240 hour(s))  SARS CORONAVIRUS 2 (TAT 6-24 HRS) Nasopharyngeal Nasopharyngeal Swab     Status: None   Collection Time: 12/18/20  5:47 PM   Specimen: Nasopharyngeal Swab  Result Value Ref Range Status   SARS Coronavirus 2 NEGATIVE NEGATIVE Final    Comment: (NOTE) SARS-CoV-2 target nucleic acids are NOT DETECTED.  The SARS-CoV-2 RNA is generally detectable in upper and lower respiratory specimens during the acute phase of infection. Negative results do not preclude SARS-CoV-2 infection, do not rule out co-infections with  other pathogens, and should not be used as the sole basis for treatment or other patient management decisions. Negative results must be combined with clinical observations, patient history, and epidemiological information. The expected result is Negative.  Fact Sheet for Patients: SugarRoll.be  Fact Sheet for Healthcare Providers: https://www.woods-mathews.com/  This test is not yet approved or cleared by the [place] FDA and  has been authorized for detection and/or diagnosis of SARS-CoV-2 by FDA under an Emergency Use Authorization (EUA). This EUA will remain  in effect (  meaning this test can be used) for the duration of the COVID-19 declaration under Se ction 564(b)(1) of the Act, 21 U.S.C. section 360bbb-3(b)(1), unless the authorization is terminated or revoked sooner.  Performed at [facility], [place] [address], [place], [place] [address]      Radiology Studies: IR Angiogram Visceral Selective  Result Date: 12/19/2020 INDICATION: Acute lower GI bleeding. Positive CTA with active extravasation at the level of the splenic flexure of the colon. Please perform mesenteric arteriogram and percutaneous embolization as indicated. EXAM: 1. ULTRASOUND GUIDANCE FOR ARTERIAL ACCESS 2. SELECTIVE SUPERIOR MESENTERIC ARTERIOGRAM 3. SELECTIVE INFERIOR MESENTERIC ARTERIOGRAM 4. SUB SELECTIVE LEFT COLIC ARTERIOGRAM 5. SUB SELECTIVE MARGINAL ARTERY ARTERIOGRAM AND PERCUTANEOUS COIL EMBOLIZATION 6. SUB SELECTIVE SIGMOIDAL ARTERIOGRAM COMPARISON:  CTA abdomen and pelvis-earlier same day MEDICATIONS: None ANESTHESIA/SEDATION: Moderate (conscious) sedation was employed during this procedure. A total of Versed 1 mg and Fentanyl 25 mcg was administered intravenously. Moderate Sedation Time: 62 minutes. The patient's level of consciousness and vital signs were monitored continuously by radiology nursing throughout the procedure under my direct supervision.  FLUOROSCOPY TIME:  21 minutes (6,269 mGy) COMPLICATIONS: None immediate. PROCEDURE: Informed consent was obtained from the patient following explanation of the procedure, risks, benefits and alternatives. All questions were addressed. A time out was performed prior to the initiation of the procedure. Maximal barrier sterile technique utilized including caps, mask, sterile gowns, sterile gloves, large sterile drape, hand hygiene, and Betadine prep. The right femoral head was marked fluoroscopically. Under sterile conditions and local anesthesia, the right common femoral artery access was performed with a micropuncture needle. Under direct ultrasound guidance, the right common femoral was accessed with a micropuncture kit. An ultrasound image was saved for documentation purposes. This allowed for placement of a 5-French vascular sheath. A limited arteriogram was performed through the side arm of the sheath confirming appropriate access within the right common femoral artery. Over a Bentson wire, a Mickelson catheter was advanced the caudal aspect of the thoracic aorta where was reformed, back bled and flushed. The Mickelson catheter was then utilized to select the superior mesenteric artery and a selective superior mesenteric arteriogram was performed. The Mickelson catheter was then utilized to select the inferior mesenteric artery and a selective inferior mesenteric arteriogram was performed. Next, with the use of a fathom 14 microwire, a STC Renegade microcatheter was advanced into the left colic artery and a selective left colic arteriogram was performed. Microcatheter was advanced to the level of the marginal artery and a selective marginal artery arteriogram was performed. Microcatheter was advanced beyond the origin of a tiny distal arcade branch supplying an ill-defined area of active contrast extravasation within the splenic flexure of the colon correlating with the findings on preceding CTA. Next, a short  segment of the marginal artery with percutaneously coil embolized with 2 mm and 3 mm diameter interlock coils. The microcatheter was retracted to the more central aspect of the marginal artery and a post embolization marginal arteriogram was performed. Microcatheter was retracted to the level of the left colic artery and a post embolization left colic arteriogram was performed Next, with the use of the fathom 14 microwire, the microcatheter was utilized to select the sigmoid artery and a sub selective sigmoid arteriogram was performed. The microcatheter was advanced to the opposite side of the embolized segment of the marginal artery and a sub selective marginal arteriogram was performed. Images were reviewed and the procedure was terminated. All wires, catheters and sheaths were removed from the  patient. Hemostasis was achieved at the right groin access site with deployment of an ExoSeal closure device and manual compression. Dressings were applied. The patient tolerated the procedure well without immediate post procedural complication. FINDINGS: Selective superior mesenteric arteriogram is negative for significant arterial supply to the ill-defined area of contrast extravasation within the splenic flexure of the colon. Excreted contrast is noted within a focally dilated left superior pole calyx as was demonstrated on preceding abdominal CT. Selective inferior mesenteric arteriogram demonstrates predominant arterial supply to the splenic flexure of the colon via the left colic artery. Sub selective left colic arteriogram demonstrates an ill-defined area of contrast extravasation arising from a tiny arcade branch of the marginal artery. This finding was confirmed with sub selective marginal artery arteriogram. Ultimately, a short segment of the marginal artery was percutaneously coil embolized with overlapping interlock coils traversing the origin of the tiny distal arcade branch supplying the ill-defined area of  active extravasation. Post embolization marginal artery and left colic arteriograms were negative for discrete area of residual contrast extravasation. Given robust arterial supply to the splenic flexure of the colon from the sigmoid artery, a selective sigmoid arteriogram was performed as was a sub selective marginal artery arteriogram supplying the opposite side of the embolized segment of the vessel however both were negative for discrete area of collateral arterial supply to the ill-defined area of active extravasation within the splenic flexure of the colon. IMPRESSION: Technically successful percutaneous coil embolization of a short segment of the marginal artery supplying the origin of a tiny distal arcade which demonstrated active intraluminal contrast extravasation, compatible with the findings seen on preceding CTA. PLAN: - The patient is to remain flat for 4 hours with right leg straight. - The patient will continue to experience several additional bloody bowel movements and may continue to require additional resuscitation (as she was bleeding both before and during the procedure), however ultimately I am hopeful she will stabilize in the coming days. - If there remains clinical concern for persistent lower GI bleeding, would recommend coordinating with the interventional radiology service to perform a nuclear medicine tagged red blood cell study during daytime working hours as to expedite potential repeat angiography if the tagged study is positive. 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 Electronically Signed   By: Sandi Mariscal M.D.   On: 12/19/2020 16:01   IR Angiogram Selective Each Additional Vessel  Result Date: 12/19/2020 INDICATION: Acute lower GI bleeding. Positive CTA with active extravasation at the level of the splenic flexure of the colon. Please perform  mesenteric arteriogram and percutaneous embolization as indicated. EXAM: 1. ULTRASOUND GUIDANCE FOR ARTERIAL ACCESS 2. SELECTIVE SUPERIOR MESENTERIC ARTERIOGRAM 3. SELECTIVE INFERIOR MESENTERIC ARTERIOGRAM 4. SUB SELECTIVE LEFT COLIC ARTERIOGRAM 5. SUB SELECTIVE MARGINAL ARTERY ARTERIOGRAM AND PERCUTANEOUS COIL EMBOLIZATION 6. SUB SELECTIVE SIGMOIDAL ARTERIOGRAM COMPARISON:  CTA abdomen and pelvis-earlier same day MEDICATIONS: None ANESTHESIA/SEDATION: Moderate (conscious) sedation was employed during this procedure. A total of Versed 1 mg and Fentanyl 25 mcg was administered intravenously. Moderate Sedation Time: 62 minutes. The patient's level of consciousness and vital signs were monitored continuously by radiology nursing throughout the procedure under my direct supervision. FLUOROSCOPY TIME:  21 minutes (7,169 mGy) COMPLICATIONS: None immediate. PROCEDURE: Informed consent was obtained from the patient following explanation of the procedure, risks, benefits and alternatives. All questions were addressed. A time out was performed prior to the initiation of  the procedure. Maximal barrier sterile technique utilized including caps, mask, sterile gowns, sterile gloves, large sterile drape, hand hygiene, and Betadine prep. The right femoral head was marked fluoroscopically. Under sterile conditions and local anesthesia, the right common femoral artery access was performed with a micropuncture needle. Under direct ultrasound guidance, the right common femoral was accessed with a micropuncture kit. An ultrasound image was saved for documentation purposes. This allowed for placement of a 5-French vascular sheath. A limited arteriogram was performed through the side arm of the sheath confirming appropriate access within the right common femoral artery. Over a Bentson wire, a Mickelson catheter was advanced the caudal aspect of the thoracic aorta where was reformed, back bled and flushed. The Mickelson catheter was then  utilized to select the superior mesenteric artery and a selective superior mesenteric arteriogram was performed. The Mickelson catheter was then utilized to select the inferior mesenteric artery and a selective inferior mesenteric arteriogram was performed. Next, with the use of a fathom 14 microwire, a STC Renegade microcatheter was advanced into the left colic artery and a selective left colic arteriogram was performed. Microcatheter was advanced to the level of the marginal artery and a selective marginal artery arteriogram was performed. Microcatheter was advanced beyond the origin of a tiny distal arcade branch supplying an ill-defined area of active contrast extravasation within the splenic flexure of the colon correlating with the findings on preceding CTA. Next, a short segment of the marginal artery with percutaneously coil embolized with 2 mm and 3 mm diameter interlock coils. The microcatheter was retracted to the more central aspect of the marginal artery and a post embolization marginal arteriogram was performed. Microcatheter was retracted to the level of the left colic artery and a post embolization left colic arteriogram was performed Next, with the use of the fathom 14 microwire, the microcatheter was utilized to select the sigmoid artery and a sub selective sigmoid arteriogram was performed. The microcatheter was advanced to the opposite side of the embolized segment of the marginal artery and a sub selective marginal arteriogram was performed. Images were reviewed and the procedure was terminated. All wires, catheters and sheaths were removed from the patient. Hemostasis was achieved at the right groin access site with deployment of an ExoSeal closure device and manual compression. Dressings were applied. The patient tolerated the procedure well without immediate post procedural complication. FINDINGS: Selective superior mesenteric arteriogram is negative for significant arterial supply to the  ill-defined area of contrast extravasation within the splenic flexure of the colon. Excreted contrast is noted within a focally dilated left superior pole calyx as was demonstrated on preceding abdominal CT. Selective inferior mesenteric arteriogram demonstrates predominant arterial supply to the splenic flexure of the colon via the left colic artery. Sub selective left colic arteriogram demonstrates an ill-defined area of contrast extravasation arising from a tiny arcade branch of the marginal artery. This finding was confirmed with sub selective marginal artery arteriogram. Ultimately, a short segment of the marginal artery was percutaneously coil embolized with overlapping interlock coils traversing the origin of the tiny distal arcade branch supplying the ill-defined area of active extravasation. Post embolization marginal artery and left colic arteriograms were negative for discrete area of residual contrast extravasation. Given robust arterial supply to the splenic flexure of the colon from the sigmoid artery, a selective sigmoid arteriogram was performed as was a sub selective marginal artery arteriogram supplying the opposite side of the embolized segment of the vessel however both were negative for discrete area of  collateral arterial supply to the ill-defined area of active extravasation within the splenic flexure of the colon. IMPRESSION: Technically successful percutaneous coil embolization of a short segment of the marginal artery supplying the origin of a tiny distal arcade which demonstrated active intraluminal contrast extravasation, compatible with the findings seen on preceding CTA. PLAN: - The patient is to remain flat for 4 hours with right leg straight. - The patient will continue to experience several additional bloody bowel movements and may continue to require additional resuscitation (as she was bleeding both before and during the procedure), however ultimately I am hopeful she will  stabilize in the coming days. - If there remains clinical concern for persistent lower GI bleeding, would recommend coordinating with the interventional radiology service to perform a nuclear medicine tagged red blood cell study during daytime working hours as to expedite potential repeat angiography if the tagged study is positive. 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 Electronically Signed   By: Sandi Mariscal M.D.   On: 12/19/2020 16:01   IR Angiogram Selective Each Additional Vessel  Result Date: 12/19/2020 INDICATION: Acute lower GI bleeding. Positive CTA with active extravasation at the level of the splenic flexure of the colon. Please perform mesenteric arteriogram and percutaneous embolization as indicated. EXAM: 1. ULTRASOUND GUIDANCE FOR ARTERIAL ACCESS 2. SELECTIVE SUPERIOR MESENTERIC ARTERIOGRAM 3. SELECTIVE INFERIOR MESENTERIC ARTERIOGRAM 4. SUB SELECTIVE LEFT COLIC ARTERIOGRAM 5. SUB SELECTIVE MARGINAL ARTERY ARTERIOGRAM AND PERCUTANEOUS COIL EMBOLIZATION 6. SUB SELECTIVE SIGMOIDAL ARTERIOGRAM COMPARISON:  CTA abdomen and pelvis-earlier same day MEDICATIONS: None ANESTHESIA/SEDATION: Moderate (conscious) sedation was employed during this procedure. A total of Versed 1 mg and Fentanyl 25 mcg was administered intravenously. Moderate Sedation Time: 62 minutes. The patient's level of consciousness and vital signs were monitored continuously by radiology nursing throughout the procedure under my direct supervision. FLUOROSCOPY TIME:  21 minutes (7,078 mGy) COMPLICATIONS: None immediate. PROCEDURE: Informed consent was obtained from the patient following explanation of the procedure, risks, benefits and alternatives. All questions were addressed. A time out was performed prior to the initiation of the procedure. Maximal barrier sterile technique utilized including caps,  mask, sterile gowns, sterile gloves, large sterile drape, hand hygiene, and Betadine prep. The right femoral head was marked fluoroscopically. Under sterile conditions and local anesthesia, the right common femoral artery access was performed with a micropuncture needle. Under direct ultrasound guidance, the right common femoral was accessed with a micropuncture kit. An ultrasound image was saved for documentation purposes. This allowed for placement of a 5-French vascular sheath. A limited arteriogram was performed through the side arm of the sheath confirming appropriate access within the right common femoral artery. Over a Bentson wire, a Mickelson catheter was advanced the caudal aspect of the thoracic aorta where was reformed, back bled and flushed. The Mickelson catheter was then utilized to select the superior mesenteric artery and a selective superior mesenteric arteriogram was performed. The Mickelson catheter was then utilized to select the inferior mesenteric artery and a selective inferior mesenteric arteriogram was performed. Next, with the use of a fathom 14 microwire, a STC Renegade microcatheter was advanced into the left colic artery and a selective left colic arteriogram was performed. Microcatheter was advanced to the level of the marginal artery and a selective marginal artery arteriogram was performed. Microcatheter was advanced beyond the origin of a tiny distal arcade branch supplying an ill-defined area of active contrast  extravasation within the splenic flexure of the colon correlating with the findings on preceding CTA. Next, a short segment of the marginal artery with percutaneously coil embolized with 2 mm and 3 mm diameter interlock coils. The microcatheter was retracted to the more central aspect of the marginal artery and a post embolization marginal arteriogram was performed. Microcatheter was retracted to the level of the left colic artery and a post embolization left colic  arteriogram was performed Next, with the use of the fathom 14 microwire, the microcatheter was utilized to select the sigmoid artery and a sub selective sigmoid arteriogram was performed. The microcatheter was advanced to the opposite side of the embolized segment of the marginal artery and a sub selective marginal arteriogram was performed. Images were reviewed and the procedure was terminated. All wires, catheters and sheaths were removed from the patient. Hemostasis was achieved at the right groin access site with deployment of an ExoSeal closure device and manual compression. Dressings were applied. The patient tolerated the procedure well without immediate post procedural complication. FINDINGS: Selective superior mesenteric arteriogram is negative for significant arterial supply to the ill-defined area of contrast extravasation within the splenic flexure of the colon. Excreted contrast is noted within a focally dilated left superior pole calyx as was demonstrated on preceding abdominal CT. Selective inferior mesenteric arteriogram demonstrates predominant arterial supply to the splenic flexure of the colon via the left colic artery. Sub selective left colic arteriogram demonstrates an ill-defined area of contrast extravasation arising from a tiny arcade branch of the marginal artery. This finding was confirmed with sub selective marginal artery arteriogram. Ultimately, a short segment of the marginal artery was percutaneously coil embolized with overlapping interlock coils traversing the origin of the tiny distal arcade branch supplying the ill-defined area of active extravasation. Post embolization marginal artery and left colic arteriograms were negative for discrete area of residual contrast extravasation. Given robust arterial supply to the splenic flexure of the colon from the sigmoid artery, a selective sigmoid arteriogram was performed as was a sub selective marginal artery arteriogram supplying the  opposite side of the embolized segment of the vessel however both were negative for discrete area of collateral arterial supply to the ill-defined area of active extravasation within the splenic flexure of the colon. IMPRESSION: Technically successful percutaneous coil embolization of a short segment of the marginal artery supplying the origin of a tiny distal arcade which demonstrated active intraluminal contrast extravasation, compatible with the findings seen on preceding CTA. PLAN: - The patient is to remain flat for 4 hours with right leg straight. - The patient will continue to experience several additional bloody bowel movements and may continue to require additional resuscitation (as she was bleeding both before and during the procedure), however ultimately I am hopeful she will stabilize in the coming days. - If there remains clinical concern for persistent lower GI bleeding, would recommend coordinating with the interventional radiology service to perform a nuclear medicine tagged red blood cell study during daytime working hours as to expedite potential repeat angiography if the tagged study is positive. 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 Electronically Signed   By: Sandi Mariscal M.D.   On: 12/19/2020 16:01   IR Angiogram Follow Up Study  Result Date: 12/19/2020 INDICATION: Acute lower GI bleeding. Positive CTA with active extravasation at the level of the splenic flexure of the colon. Please perform mesenteric  arteriogram and percutaneous embolization as indicated. EXAM: 1. ULTRASOUND GUIDANCE FOR ARTERIAL ACCESS 2. SELECTIVE SUPERIOR MESENTERIC ARTERIOGRAM 3. SELECTIVE INFERIOR MESENTERIC ARTERIOGRAM 4. SUB SELECTIVE LEFT COLIC ARTERIOGRAM 5. SUB SELECTIVE MARGINAL ARTERY ARTERIOGRAM AND PERCUTANEOUS COIL EMBOLIZATION 6. SUB SELECTIVE SIGMOIDAL ARTERIOGRAM  COMPARISON:  CTA abdomen and pelvis-earlier same day MEDICATIONS: None ANESTHESIA/SEDATION: Moderate (conscious) sedation was employed during this procedure. A total of Versed 1 mg and Fentanyl 25 mcg was administered intravenously. Moderate Sedation Time: 62 minutes. The patient's level of consciousness and vital signs were monitored continuously by radiology nursing throughout the procedure under my direct supervision. FLUOROSCOPY TIME:  21 minutes (6,811 mGy) COMPLICATIONS: None immediate. PROCEDURE: Informed consent was obtained from the patient following explanation of the procedure, risks, benefits and alternatives. All questions were addressed. A time out was performed prior to the initiation of the procedure. Maximal barrier sterile technique utilized including caps, mask, sterile gowns, sterile gloves, large sterile drape, hand hygiene, and Betadine prep. The right femoral head was marked fluoroscopically. Under sterile conditions and local anesthesia, the right common femoral artery access was performed with a micropuncture needle. Under direct ultrasound guidance, the right common femoral was accessed with a micropuncture kit. An ultrasound image was saved for documentation purposes. This allowed for placement of a 5-French vascular sheath. A limited arteriogram was performed through the side arm of the sheath confirming appropriate access within the right common femoral artery. Over a Bentson wire, a Mickelson catheter was advanced the caudal aspect of the thoracic aorta where was reformed, back bled and flushed. The Mickelson catheter was then utilized to select the superior mesenteric artery and a selective superior mesenteric arteriogram was performed. The Mickelson catheter was then utilized to select the inferior mesenteric artery and a selective inferior mesenteric arteriogram was performed. Next, with the use of a fathom 14 microwire, a STC Renegade microcatheter was advanced into the left colic  artery and a selective left colic arteriogram was performed. Microcatheter was advanced to the level of the marginal artery and a selective marginal artery arteriogram was performed. Microcatheter was advanced beyond the origin of a tiny distal arcade branch supplying an ill-defined area of active contrast extravasation within the splenic flexure of the colon correlating with the findings on preceding CTA. Next, a short segment of the marginal artery with percutaneously coil embolized with 2 mm and 3 mm diameter interlock coils. The microcatheter was retracted to the more central aspect of the marginal artery and a post embolization marginal arteriogram was performed. Microcatheter was retracted to the level of the left colic artery and a post embolization left colic arteriogram was performed Next, with the use of the fathom 14 microwire, the microcatheter was utilized to select the sigmoid artery and a sub selective sigmoid arteriogram was performed. The microcatheter was advanced to the opposite side of the embolized segment of the marginal artery and a sub selective marginal arteriogram was performed. Images were reviewed and the procedure was terminated. All wires, catheters and sheaths were removed from the patient. Hemostasis was achieved at the right groin access site with deployment of an ExoSeal closure device and manual compression. Dressings were applied. The patient tolerated the procedure well without immediate post procedural complication. FINDINGS: Selective superior mesenteric arteriogram is negative for significant arterial supply to the ill-defined area of contrast extravasation within the splenic flexure of the colon. Excreted contrast is noted within a focally dilated left superior pole calyx as was demonstrated on preceding abdominal CT. Selective inferior mesenteric arteriogram demonstrates predominant  arterial supply to the splenic flexure of the colon via the left colic artery. Sub selective  left colic arteriogram demonstrates an ill-defined area of contrast extravasation arising from a tiny arcade branch of the marginal artery. This finding was confirmed with sub selective marginal artery arteriogram. Ultimately, a short segment of the marginal artery was percutaneously coil embolized with overlapping interlock coils traversing the origin of the tiny distal arcade branch supplying the ill-defined area of active extravasation. Post embolization marginal artery and left colic arteriograms were negative for discrete area of residual contrast extravasation. Given robust arterial supply to the splenic flexure of the colon from the sigmoid artery, a selective sigmoid arteriogram was performed as was a sub selective marginal artery arteriogram supplying the opposite side of the embolized segment of the vessel however both were negative for discrete area of collateral arterial supply to the ill-defined area of active extravasation within the splenic flexure of the colon. IMPRESSION: Technically successful percutaneous coil embolization of a short segment of the marginal artery supplying the origin of a tiny distal arcade which demonstrated active intraluminal contrast extravasation, compatible with the findings seen on preceding CTA. PLAN: - The patient is to remain flat for 4 hours with right leg straight. - The patient will continue to experience several additional bloody bowel movements and may continue to require additional resuscitation (as she was bleeding both before and during the procedure), however ultimately I am hopeful she will stabilize in the coming days. - If there remains clinical concern for persistent lower GI bleeding, would recommend coordinating with the interventional radiology service to perform a nuclear medicine tagged red blood cell study during daytime working hours as to expedite potential repeat angiography if the tagged study is positive. - While presumably secondary to  diverticular disease, repeat colonoscopy after the resolution of acute symptoms is advised to exclude the presence of an underlying mass/lesion. Electronically Signed   By: Sandi Mariscal M.D.   On: 12/19/2020 16:01   IR US Guide Vasc Access Right  Result Date: 12/19/2020 INDICATION: Acute lower GI bleeding. Positive CTA with active extravasation at the level of the splenic flexure of the colon. Please perform mesenteric arteriogram and percutaneous embolization as indicated. EXAM: 1. ULTRASOUND GUIDANCE FOR ARTERIAL ACCESS 2. SELECTIVE SUPERIOR MESENTERIC ARTERIOGRAM 3. SELECTIVE INFERIOR MESENTERIC ARTERIOGRAM 4. SUB SELECTIVE LEFT COLIC ARTERIOGRAM 5. SUB SELECTIVE MARGINAL ARTERY ARTERIOGRAM AND PERCUTANEOUS COIL EMBOLIZATION 6. SUB SELECTIVE SIGMOIDAL ARTERIOGRAM COMPARISON:  CTA abdomen and pelvis-earlier same day MEDICATIONS: None ANESTHESIA/SEDATION: Moderate (conscious) sedation was employed during this procedure. A total of Versed 1 mg and Fentanyl 25 mcg was administered intravenously. Moderate Sedation Time: 62 minutes. The patient's level of consciousness and vital signs were monitored continuously by radiology nursing throughout the procedure under my direct supervision. FLUOROSCOPY TIME:  21 minutes (4,680 mGy) COMPLICATIONS: None immediate. PROCEDURE: Informed consent was obtained from the patient following explanation of the procedure, risks, benefits and alternatives. All questions were addressed. A time out was performed prior to the initiation of the procedure. Maximal barrier sterile technique utilized including caps, mask, sterile gowns, sterile gloves, large sterile drape, hand hygiene, and Betadine prep. The right femoral head was marked fluoroscopically. Under sterile conditions and local anesthesia, the right common femoral artery access was performed with a micropuncture needle. Under direct ultrasound guidance, the right common femoral was accessed with a micropuncture kit. An ultrasound  image was saved for documentation purposes. This allowed for placement of a 5-French vascular sheath. A limited arteriogram was performed  through the side arm of the sheath confirming appropriate access within the right common femoral artery. Over a Bentson wire, a Mickelson catheter was advanced the caudal aspect of the thoracic aorta where was reformed, back bled and flushed. The Mickelson catheter was then utilized to select the superior mesenteric artery and a selective superior mesenteric arteriogram was performed. The Mickelson catheter was then utilized to select the inferior mesenteric artery and a selective inferior mesenteric arteriogram was performed. Next, with the use of a fathom 14 microwire, a STC Renegade microcatheter was advanced into the left colic artery and a selective left colic arteriogram was performed. Microcatheter was advanced to the level of the marginal artery and a selective marginal artery arteriogram was performed. Microcatheter was advanced beyond the origin of a tiny distal arcade branch supplying an ill-defined area of active contrast extravasation within the splenic flexure of the colon correlating with the findings on preceding CTA. Next, a short segment of the marginal artery with percutaneously coil embolized with 2 mm and 3 mm diameter interlock coils. The microcatheter was retracted to the more central aspect of the marginal artery and a post embolization marginal arteriogram was performed. Microcatheter was retracted to the level of the left colic artery and a post embolization left colic arteriogram was performed Next, with the use of the fathom 14 microwire, the microcatheter was utilized to select the sigmoid artery and a sub selective sigmoid arteriogram was performed. The microcatheter was advanced to the opposite side of the embolized segment of the marginal artery and a sub selective marginal arteriogram was performed. Images were reviewed and the procedure was  terminated. All wires, catheters and sheaths were removed from the patient. Hemostasis was achieved at the right groin access site with deployment of an ExoSeal closure device and manual compression. Dressings were applied. The patient tolerated the procedure well without immediate post procedural complication. FINDINGS: Selective superior mesenteric arteriogram is negative for significant arterial supply to the ill-defined area of contrast extravasation within the splenic flexure of the colon. Excreted contrast is noted within a focally dilated left superior pole calyx as was demonstrated on preceding abdominal CT. Selective inferior mesenteric arteriogram demonstrates predominant arterial supply to the splenic flexure of the colon via the left colic artery. Sub selective left colic arteriogram demonstrates an ill-defined area of contrast extravasation arising from a tiny arcade branch of the marginal artery. This finding was confirmed with sub selective marginal artery arteriogram. Ultimately, a short segment of the marginal artery was percutaneously coil embolized with overlapping interlock coils traversing the origin of the tiny distal arcade branch supplying the ill-defined area of active extravasation. Post embolization marginal artery and left colic arteriograms were negative for discrete area of residual contrast extravasation. Given robust arterial supply to the splenic flexure of the colon from the sigmoid artery, a selective sigmoid arteriogram was performed as was a sub selective marginal artery arteriogram supplying the opposite side of the embolized segment of the vessel however both were negative for discrete area of collateral arterial supply to the ill-defined area of active extravasation within the splenic flexure of the colon. IMPRESSION: Technically successful percutaneous coil embolization of a short segment of the marginal artery supplying the origin of a tiny distal arcade which demonstrated  active intraluminal contrast extravasation, compatible with the findings seen on preceding CTA. PLAN: - The patient is to remain flat for 4 hours with right leg straight. - The patient will continue to experience several additional bloody bowel movements and may continue  to require additional resuscitation (as she was bleeding both before and during the procedure), however ultimately I am hopeful she will stabilize in the coming days. - If there remains clinical concern for persistent lower GI bleeding, would recommend coordinating with the interventional radiology service to perform a nuclear medicine tagged red blood cell study during daytime working hours as to expedite potential repeat angiography if the tagged study is positive. 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 Electronically Signed   By: Sandi Mariscal M.D.   On: 12/19/2020 16:01   IR EMBO ART  VEN HEMORR LYMPH EXTRAV  INC GUIDE ROADMAPPING  Result Date: 12/19/2020 INDICATION: Acute lower GI bleeding. Positive CTA with active extravasation at the level of the splenic flexure of the colon. Please perform mesenteric arteriogram and percutaneous embolization as indicated. EXAM: 1. ULTRASOUND GUIDANCE FOR ARTERIAL ACCESS 2. SELECTIVE SUPERIOR MESENTERIC ARTERIOGRAM 3. SELECTIVE INFERIOR MESENTERIC ARTERIOGRAM 4. SUB SELECTIVE LEFT COLIC ARTERIOGRAM 5. SUB SELECTIVE MARGINAL ARTERY ARTERIOGRAM AND PERCUTANEOUS COIL EMBOLIZATION 6. SUB SELECTIVE SIGMOIDAL ARTERIOGRAM COMPARISON:  CTA abdomen and pelvis-earlier same day MEDICATIONS: None ANESTHESIA/SEDATION: Moderate (conscious) sedation was employed during this procedure. A total of Versed 1 mg and Fentanyl 25 mcg was administered intravenously. Moderate Sedation Time: 62 minutes. The patient's level of consciousness and vital signs were monitored continuously by radiology  nursing throughout the procedure under my direct supervision. FLUOROSCOPY TIME:  21 minutes (8,921 mGy) COMPLICATIONS: None immediate. PROCEDURE: Informed consent was obtained from the patient following explanation of the procedure, risks, benefits and alternatives. All questions were addressed. A time out was performed prior to the initiation of the procedure. Maximal barrier sterile technique utilized including caps, mask, sterile gowns, sterile gloves, large sterile drape, hand hygiene, and Betadine prep. The right femoral head was marked fluoroscopically. Under sterile conditions and local anesthesia, the right common femoral artery access was performed with a micropuncture needle. Under direct ultrasound guidance, the right common femoral was accessed with a micropuncture kit. An ultrasound image was saved for documentation purposes. This allowed for placement of a 5-French vascular sheath. A limited arteriogram was performed through the side arm of the sheath confirming appropriate access within the right common femoral artery. Over a Bentson wire, a Mickelson catheter was advanced the caudal aspect of the thoracic aorta where was reformed, back bled and flushed. The Mickelson catheter was then utilized to select the superior mesenteric artery and a selective superior mesenteric arteriogram was performed. The Mickelson catheter was then utilized to select the inferior mesenteric artery and a selective inferior mesenteric arteriogram was performed. Next, with the use of a fathom 14 microwire, a STC Renegade microcatheter was advanced into the left colic artery and a selective left colic arteriogram was performed. Microcatheter was advanced to the level of the marginal artery and a selective marginal artery arteriogram was performed. Microcatheter was advanced beyond the origin of a tiny distal arcade branch supplying an ill-defined area of active contrast extravasation within the splenic flexure of the colon  correlating with the findings on preceding CTA. Next, a short segment of the marginal artery with percutaneously coil embolized with 2 mm and 3 mm diameter interlock coils. The microcatheter was retracted to the more central aspect of the marginal artery and a post embolization marginal arteriogram was performed. Microcatheter was retracted to the level of the left colic artery and a post embolization left colic arteriogram was performed  Next, with the use of the fathom 14 microwire, the microcatheter was utilized to select the sigmoid artery and a sub selective sigmoid arteriogram was performed. The microcatheter was advanced to the opposite side of the embolized segment of the marginal artery and a sub selective marginal arteriogram was performed. Images were reviewed and the procedure was terminated. All wires, catheters and sheaths were removed from the patient. Hemostasis was achieved at the right groin access site with deployment of an ExoSeal closure device and manual compression. Dressings were applied. The patient tolerated the procedure well without immediate post procedural complication. FINDINGS: Selective superior mesenteric arteriogram is negative for significant arterial supply to the ill-defined area of contrast extravasation within the splenic flexure of the colon. Excreted contrast is noted within a focally dilated left superior pole calyx as was demonstrated on preceding abdominal CT. Selective inferior mesenteric arteriogram demonstrates predominant arterial supply to the splenic flexure of the colon via the left colic artery. Sub selective left colic arteriogram demonstrates an ill-defined area of contrast extravasation arising from a tiny arcade branch of the marginal artery. This finding was confirmed with sub selective marginal artery arteriogram. Ultimately, a short segment of the marginal artery was percutaneously coil embolized with overlapping interlock coils traversing the origin of the  tiny distal arcade branch supplying the ill-defined area of active extravasation. Post embolization marginal artery and left colic arteriograms were negative for discrete area of residual contrast extravasation. Given robust arterial supply to the splenic flexure of the colon from the sigmoid artery, a selective sigmoid arteriogram was performed as was a sub selective marginal artery arteriogram supplying the opposite side of the embolized segment of the vessel however both were negative for discrete area of collateral arterial supply to the ill-defined area of active extravasation within the splenic flexure of the colon. IMPRESSION: Technically successful percutaneous coil embolization of a short segment of the marginal artery supplying the origin of a tiny distal arcade which demonstrated active intraluminal contrast extravasation, compatible with the findings seen on preceding CTA. PLAN: - The patient is to remain flat for 4 hours with right leg straight. - The patient will continue to experience several additional bloody bowel movements and may continue to require additional resuscitation (as she was bleeding both before and during the procedure), however ultimately I am hopeful she will stabilize in the coming days. - If there remains clinical concern for persistent lower GI bleeding, would recommend coordinating with the interventional radiology service to perform a nuclear medicine tagged red blood cell study during daytime working hours as to expedite potential repeat angiography if the tagged study is positive. 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 Electronically Signed   By: Sandi Mariscal M.D.   On: 12/19/2020 16:01   CT ANGIO GI BLEED  Result Date: 12/19/2020 CLINICAL DATA:  Diverticulosis, recurrent diverticular hemorrhage, continued hematochezia EXAM: CTA ABDOMEN  AND PELVIS WITHOUT AND WITH CONTRAST TECHNIQUE: Multidetector CT imaging of the abdomen and pelvis was performed using the standard protocol during bolus administration of intravenous contrast. Multiplanar reconstructed images and MIPs were obtained and reviewed to evaluate the vascular anatomy. CONTRAST:  170m OMNIPAQUE IOHEXOL 350 MG/ML SOLN COMPARISON:  CT pelvis, 08/17/2018 FINDINGS: VASCULAR Moderate calcific aortic atherosclerosis. Normal contour and caliber of the abdominal aorta. 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 Otherwise standard branching pattern of the abdominal aorta. Atherosclerosis at the branch vessel origins without significant stenosis. There is an intraluminal arterial contrast blush within the distal transverse colon, just proximal to the splenic flexure, with multiple diverticula in this vicinity (series 7, image 36, series 12, image 66). Review of the MIP images confirms the above findings. NON-VASCULAR Lower chest: No acute abnormality. Hepatobiliary: No focal liver abnormality is seen. No gallstones, gallbladder wall thickening, or biliary dilatation. Pancreas: Unremarkable. No pancreatic ductal dilatation or surrounding inflammatory changes. Spleen: Normal in size without focal abnormality. Adrenals/Urinary Tract: Adrenal glands are unremarkable. Left-sided parapelvic cyst. Kidneys are otherwise normal, without renal calculi, solid lesion, or hydronephrosis. Bladder is unremarkable. Stomach/Bowel: Stomach is within normal limits. Appendix appears normal. No evidence of bowel wall thickening, distention, or inflammatory changes. Lymphatic: No significant vascular findings are present. No enlarged abdominal or pelvic lymph nodes. Reproductive: Uterus and bilateral adnexa are unremarkable. Other: No abdominal wall hernia or abnormality. No  abdominopelvic ascites. Musculoskeletal: No acute or significant osseous findings. IMPRESSION: 1. Intraluminal arterial contrast blush, localizing diverticular hemorrhage to the distal transverse colon just proximal to the splenic flexure. 2.  Pancolonic diverticulosis. 3.  Aortic atherosclerosis. These results will be called to the ordering clinician or representative by the Radiologist Assistant, and communication documented in the PACS or [employer]. Electronically Signed   By: Eddie Candle M.D.   On: 12/19/2020 10:43   IR EMBO ART  VEN HEMORR LYMPH EXTRAV  INC GUIDE ROADMAPPING  Final Result    IR US Guide Vasc Access Right  Final Result    IR Angiogram Visceral Selective  Final Result    IR Angiogram Selective Each Additional Vessel  Final Result    IR Angiogram Selective Each Additional Vessel  Final Result    IR Angiogram Follow Up Study  Final Result    CT ANGIO GI BLEED  Final Result      Scheduled Meds:  Chlorhexidine Gluconate Cloth  6 each Topical Daily   diclofenac Sodium  2 g Topical QID   dorzolamide-timolol  1 drop Both Eyes BID   DULoxetine  20 mg Oral Daily   guaiFENesin  600 mg Oral BID   latanoprost  1 drop Both Eyes QHS   loratadine  10 mg Oral Daily   pantoprazole  40 mg Oral Daily   polyethylene glycol  17 g Oral Daily   potassium chloride  40 mEq Oral Daily   rOPINIRole  0.5 mg Oral QHS   sertraline  100 mg Oral Daily   simvastatin  20 mg Oral Daily   PRN Meds: acetaminophen, albuterol, ALPRAZolam Continuous Infusions:  dextrose 5 % 1,000 mL with potassium chloride 40 mEq infusion       LOS: 3 days  Time spent: Greater than 50% of the 35 minute visit was spent in counseling/coordination of care for the patient as laid out in the A&P.   Nita Sells, MD [facility] 12/21/2020, 7:13 AM

## 2020-12-21 NOTE — Progress Notes (Signed)
Yesterday's events noted:   some instability of vitals w/ passage of some blood early yesterday evening, but not frank bleeding like she was having at time of admission.  No subsequent passage of blood.    Hgb did drop yesterday from 8.7 to 6.8, pronpting Tx 1 U prc's (her 3rd since admission). Appropriate rise in hgb to 8.3 as of early this morning.  Pt is really without complaints and doesn't have the impression she's bleeding.  EXAM:  most recent VS nl.  Lying in bed NAD.  Denies SOB CP abd pn.  Abd soft and NT.  Skin very pale but warm and well-perfused.  Radial pulse full.   IMPR:  probable intermittent low-grade recurrent bleeding over the past 36 hrs since embolization but without the severity of bleeding that she presented with.  RECOMM:  cont observation and supportive care.  Q 12hr cbc's.  NPO until dinner (in case repeat embolization is needed); then clr liq..  Query IR whether repeat embolization is feasible if pt shows signs of ongoing bleeding.    [name], M.D. Pager [phone] If no answer or after 5 PM call [phone]

## 2020-12-21 NOTE — Progress Notes (Signed)
PT Cancellation Note  Patient Details Name: [name] MRN: [id number] DOB: 03-28-1941   Cancelled Treatment:    Reason Eval/Treat Not Completed: Fatigue/lethargy limiting ability to participate  Patient just back to bed from sitting up in chair. Reports she has had nothing to eat (she is npo today until dinner) and really doesn't feel she can do much until she rests and gets something to eat. States she may go home 6/24 therefore will try to see as early as possible on 6/24.   Arby Barrette, PT Pager [phone]   Rexanne Mano 12/21/2020, 2:38 PM

## 2020-12-21 NOTE — Progress Notes (Signed)
Referring Physician(s): Buccini, R. Izora Gala, J.   Supervising Physician: Arne Cleveland  Patient Status:  [facility] - In-pt  Chief Complaint:  LGI bleed, s/p embolization of a short segment of the marginal artery supplying the splenic flexure of the colon with IR on 6/21.   Subjective:  Pt laying in bed, not in acute distress.  Reports headache ans asked for Tyrenol, RN came into room with the Tyrenol during assessment. Describes HA a "nuisance" when asked by RN to rate the pain on the 10 point pain scale.  Denies symptoms of acute bleeding, SOB, chest palpitation, lightheadedness, "my only complaints is that I am bored." Asked about having bloody BM last night or this morning, states that she is not sure.  RN states that pt has not had any bloody BM since 7 am.   Allergies: Morphine and related, Abaloparatide, Aspirin, Duloxetine hcl, and Cefadroxil  Medications: Prior to Admission medications   Medication Sig Start Date End Date Taking? Authorizing Provider  acetaminophen (TYLENOL) 500 MG tablet Take 1,000 mg by mouth daily as needed for headache.   Yes [provider]  albuterol (PROVENTIL HFA;VENTOLIN HFA) 108 (90 Base) MCG/ACT inhaler Inhale 2 puffs into the lungs daily as needed. For seasonal allergies . Use 2 puffs 3 times daily x 4 days then back to home regimen. Patient taking differently: Inhale 2 puffs into the lungs 2 (two) times a week. 08/10/17  Yes Eugenie Filler, MD  ALPRAZolam Duanne Moron) 0.25 MG tablet Take 1 tablet (0.25 mg total) by mouth daily as needed for anxiety. 09/14/17  Yes Aline August, MD  cholecalciferol (VITAMIN D) 1000 UNITS tablet Take 2,000 Units by mouth 2 (two) times daily.    Yes [provider]  diclofenac sodium (VOLTAREN) 1 % GEL Apply 4 g topically 4 (four) times daily. Patient taking differently: Apply 4 g topically 3 (three) times a week. 08/26/18  Yes Deno Etienne, DO  dorzolamide-timolol (COSOPT) 22.3-6.8 MG/ML ophthalmic  solution Place 1 drop into both eyes 2 (two) times daily.   Yes [provider]  latanoprost (XALATAN) 0.005 % ophthalmic solution Place 1 drop into both eyes at bedtime. 02/13/18  Yes [provider]  loratadine (CLARITIN) 10 MG tablet Take 1 tablet (10 mg total) by mouth daily. 08/11/17  Yes Eugenie Filler, MD  Multiple Vitamin (MULTIVITAMIN WITH MINERALS) TABS Take 1 tablet by mouth daily.   Yes [provider]  Multiple Vitamins-Minerals (ICAPS AREDS 2) CAPS Take 1 capsule by mouth 2 (two) times daily.   Yes [provider]  omeprazole (PRILOSEC) 20 MG capsule Take 20 mg by mouth every evening.   Yes [provider]  OXYGEN 2lpm with sleep only  [employer]   Yes [provider]  polyethylene glycol (MIRALAX / GLYCOLAX) packet Take 17 g by mouth daily.   Yes [provider]  propranolol (INDERAL) 20 MG tablet Take 20 mg by mouth in the morning.   Yes [provider]  sertraline (ZOLOFT) 100 MG tablet Take 150 mg by mouth in the morning.   Yes [provider]  simvastatin (ZOCOR) 20 MG tablet Take 20 mg by mouth daily at 6 PM.   Yes [provider]  penicillin v potassium (VEETID) 500 MG tablet Take 500 mg by mouth 4 (four) times daily. 12/05/20   [provider]     Vital Signs: BP (!) 115/53 (BP Location: Left Arm)   Pulse 98   Temp 98.4 F (36.9 C) (  Oral)   Resp (!) 23   Ht '5\' 2"'  (1.575 m)   Wt 182 lb 5.1 oz (82.7 kg)   SpO2 94%   BMI 33.35 kg/m   Physical Exam Vitals reviewed.  Constitutional:      General: She is not in acute distress.    Comments: Have eyes closed due to headache   HENT:     Head: Normocephalic and atraumatic.  Cardiovascular:     Rate and Rhythm: Normal rate.  Pulmonary:     Effort: Pulmonary effort is normal.  Abdominal:     General: Abdomen is flat.     Palpations: Abdomen is soft.  Skin:    General: Skin is warm and dry.     Comments: Positive  dressing on RFA puncture site. Site is unremarkable with no erythema, edema, tenderness, bleeding or drainage. Dressing otherwise clean, dry, and intact.     Neurological:     Mental Status: She is alert and oriented to person, place, and time.  Psychiatric:        Mood and Affect: Mood normal.        Behavior: Behavior normal.    Imaging: IR Angiogram Visceral Selective  Result Date: 12/19/2020 INDICATION: Acute lower GI bleeding. Positive CTA with active extravasation at the level of the splenic flexure of the colon. Please perform mesenteric arteriogram and percutaneous embolization as indicated. EXAM: 1. ULTRASOUND GUIDANCE FOR ARTERIAL ACCESS 2. SELECTIVE SUPERIOR MESENTERIC ARTERIOGRAM 3. SELECTIVE INFERIOR MESENTERIC ARTERIOGRAM 4. SUB SELECTIVE LEFT COLIC ARTERIOGRAM 5. SUB SELECTIVE MARGINAL ARTERY ARTERIOGRAM AND PERCUTANEOUS COIL EMBOLIZATION 6. SUB SELECTIVE SIGMOIDAL ARTERIOGRAM COMPARISON:  CTA abdomen and pelvis-earlier same day MEDICATIONS: None ANESTHESIA/SEDATION: Moderate (conscious) sedation was employed during this procedure. A total of Versed 1 mg and Fentanyl 25 mcg was administered intravenously. Moderate Sedation Time: 62 minutes. The patient's level of consciousness and vital signs were monitored continuously by radiology nursing throughout the procedure under my direct supervision. FLUOROSCOPY TIME:  21 minutes (4,627 mGy) COMPLICATIONS: None immediate. PROCEDURE: Informed consent was obtained from the patient following explanation of the procedure, risks, benefits and alternatives. All questions were addressed. A time out was performed prior to the initiation of the procedure. Maximal barrier sterile technique utilized including caps, mask, sterile gowns, sterile gloves, large sterile drape, hand hygiene, and Betadine prep. The right femoral head was marked fluoroscopically. Under sterile conditions and local anesthesia, the right common femoral artery access was performed  with a micropuncture needle. Under direct ultrasound guidance, the right common femoral was accessed with a micropuncture kit. An ultrasound image was saved for documentation purposes. This allowed for placement of a 5-French vascular sheath. A limited arteriogram was performed through the side arm of the sheath confirming appropriate access within the right common femoral artery. Over a Bentson wire, a Mickelson catheter was advanced the caudal aspect of the thoracic aorta where was reformed, back bled and flushed. The Mickelson catheter was then utilized to select the superior mesenteric artery and a selective superior mesenteric arteriogram was performed. The Mickelson catheter was then utilized to select the inferior mesenteric artery and a selective inferior mesenteric arteriogram was performed. Next, with the use of a fathom 14 microwire, a STC Renegade microcatheter was advanced into the left colic artery and a selective left colic arteriogram was performed. Microcatheter was advanced to the level of the marginal artery and a selective marginal artery arteriogram was performed. Microcatheter was advanced beyond the origin of a tiny distal arcade branch supplying an ill-defined area  of active contrast extravasation within the splenic flexure of the colon correlating with the findings on preceding CTA. Next, a short segment of the marginal artery with percutaneously coil embolized with 2 mm and 3 mm diameter interlock coils. The microcatheter was retracted to the more central aspect of the marginal artery and a post embolization marginal arteriogram was performed. Microcatheter was retracted to the level of the left colic artery and a post embolization left colic arteriogram was performed Next, with the use of the fathom 14 microwire, the microcatheter was utilized to select the sigmoid artery and a sub selective sigmoid arteriogram was performed. The microcatheter was advanced to the opposite side of the  embolized segment of the marginal artery and a sub selective marginal arteriogram was performed. Images were reviewed and the procedure was terminated. All wires, catheters and sheaths were removed from the patient. Hemostasis was achieved at the right groin access site with deployment of an ExoSeal closure device and manual compression. Dressings were applied. The patient tolerated the procedure well without immediate post procedural complication. FINDINGS: Selective superior mesenteric arteriogram is negative for significant arterial supply to the ill-defined area of contrast extravasation within the splenic flexure of the colon. Excreted contrast is noted within a focally dilated left superior pole calyx as was demonstrated on preceding abdominal CT. Selective inferior mesenteric arteriogram demonstrates predominant arterial supply to the splenic flexure of the colon via the left colic artery. Sub selective left colic arteriogram demonstrates an ill-defined area of contrast extravasation arising from a tiny arcade branch of the marginal artery. This finding was confirmed with sub selective marginal artery arteriogram. Ultimately, a short segment of the marginal artery was percutaneously coil embolized with overlapping interlock coils traversing the origin of the tiny distal arcade branch supplying the ill-defined area of active extravasation. Post embolization marginal artery and left colic arteriograms were negative for discrete area of residual contrast extravasation. Given robust arterial supply to the splenic flexure of the colon from the sigmoid artery, a selective sigmoid arteriogram was performed as was a sub selective marginal artery arteriogram supplying the opposite side of the embolized segment of the vessel however both were negative for discrete area of collateral arterial supply to the ill-defined area of active extravasation within the splenic flexure of the colon. IMPRESSION: Technically  successful percutaneous coil embolization of a short segment of the marginal artery supplying the origin of a tiny distal arcade which demonstrated active intraluminal contrast extravasation, compatible with the findings seen on preceding CTA. PLAN: - The patient is to remain flat for 4 hours with right leg straight. - The patient will continue to experience several additional bloody bowel movements and may continue to require additional resuscitation (as she was bleeding both before and during the procedure), however ultimately I am hopeful she will stabilize in the coming days. - If there remains clinical concern for persistent lower GI bleeding, would recommend coordinating with the interventional radiology service to perform a nuclear medicine tagged red blood cell study during daytime working hours as to expedite potential repeat angiography if the tagged study is positive. 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 Electronically Signed   By: Sandi Mariscal M.D.   On: 12/19/2020 16:01   IR Angiogram Selective Each Additional Vessel  Result Date: 12/19/2020 INDICATION: Acute lower GI bleeding. Positive CTA with active extravasation at the level of the splenic flexure of the  colon. Please perform mesenteric arteriogram and percutaneous embolization as indicated. EXAM: 1. ULTRASOUND GUIDANCE FOR ARTERIAL ACCESS 2. SELECTIVE SUPERIOR MESENTERIC ARTERIOGRAM 3. SELECTIVE INFERIOR MESENTERIC ARTERIOGRAM 4. SUB SELECTIVE LEFT COLIC ARTERIOGRAM 5. SUB SELECTIVE MARGINAL ARTERY ARTERIOGRAM AND PERCUTANEOUS COIL EMBOLIZATION 6. SUB SELECTIVE SIGMOIDAL ARTERIOGRAM COMPARISON:  CTA abdomen and pelvis-earlier same day MEDICATIONS: None ANESTHESIA/SEDATION: Moderate (conscious) sedation was employed during this procedure. A total of Versed 1 mg and Fentanyl 25 mcg was administered  intravenously. Moderate Sedation Time: 62 minutes. The patient's level of consciousness and vital signs were monitored continuously by radiology nursing throughout the procedure under my direct supervision. FLUOROSCOPY TIME:  21 minutes (5,329 mGy) COMPLICATIONS: None immediate. PROCEDURE: Informed consent was obtained from the patient following explanation of the procedure, risks, benefits and alternatives. All questions were addressed. A time out was performed prior to the initiation of the procedure. Maximal barrier sterile technique utilized including caps, mask, sterile gowns, sterile gloves, large sterile drape, hand hygiene, and Betadine prep. The right femoral head was marked fluoroscopically. Under sterile conditions and local anesthesia, the right common femoral artery access was performed with a micropuncture needle. Under direct ultrasound guidance, the right common femoral was accessed with a micropuncture kit. An ultrasound image was saved for documentation purposes. This allowed for placement of a 5-French vascular sheath. A limited arteriogram was performed through the side arm of the sheath confirming appropriate access within the right common femoral artery. Over a Bentson wire, a Mickelson catheter was advanced the caudal aspect of the thoracic aorta where was reformed, back bled and flushed. The Mickelson catheter was then utilized to select the superior mesenteric artery and a selective superior mesenteric arteriogram was performed. The Mickelson catheter was then utilized to select the inferior mesenteric artery and a selective inferior mesenteric arteriogram was performed. Next, with the use of a fathom 14 microwire, a STC Renegade microcatheter was advanced into the left colic artery and a selective left colic arteriogram was performed. Microcatheter was advanced to the level of the marginal artery and a selective marginal artery arteriogram was performed. Microcatheter was advanced beyond  the origin of a tiny distal arcade branch supplying an ill-defined area of active contrast extravasation within the splenic flexure of the colon correlating with the findings on preceding CTA. Next, a short segment of the marginal artery with percutaneously coil embolized with 2 mm and 3 mm diameter interlock coils. The microcatheter was retracted to the more central aspect of the marginal artery and a post embolization marginal arteriogram was performed. Microcatheter was retracted to the level of the left colic artery and a post embolization left colic arteriogram was performed Next, with the use of the fathom 14 microwire, the microcatheter was utilized to select the sigmoid artery and a sub selective sigmoid arteriogram was performed. The microcatheter was advanced to the opposite side of the embolized segment of the marginal artery and a sub selective marginal arteriogram was performed. Images were reviewed and the procedure was terminated. All wires, catheters and sheaths were removed from the patient. Hemostasis was achieved at the right groin access site with deployment of an ExoSeal closure device and manual compression. Dressings were applied. The patient tolerated the procedure well without immediate post procedural complication. FINDINGS: Selective superior mesenteric arteriogram is negative for significant arterial supply to the ill-defined area of contrast extravasation within the splenic flexure of the colon. Excreted contrast is noted within a focally dilated left superior pole calyx as was demonstrated on preceding abdominal CT. Selective inferior  mesenteric arteriogram demonstrates predominant arterial supply to the splenic flexure of the colon via the left colic artery. Sub selective left colic arteriogram demonstrates an ill-defined area of contrast extravasation arising from a tiny arcade branch of the marginal artery. This finding was confirmed with sub selective marginal artery arteriogram.  Ultimately, a short segment of the marginal artery was percutaneously coil embolized with overlapping interlock coils traversing the origin of the tiny distal arcade branch supplying the ill-defined area of active extravasation. Post embolization marginal artery and left colic arteriograms were negative for discrete area of residual contrast extravasation. Given robust arterial supply to the splenic flexure of the colon from the sigmoid artery, a selective sigmoid arteriogram was performed as was a sub selective marginal artery arteriogram supplying the opposite side of the embolized segment of the vessel however both were negative for discrete area of collateral arterial supply to the ill-defined area of active extravasation within the splenic flexure of the colon. IMPRESSION: Technically successful percutaneous coil embolization of a short segment of the marginal artery supplying the origin of a tiny distal arcade which demonstrated active intraluminal contrast extravasation, compatible with the findings seen on preceding CTA. PLAN: - The patient is to remain flat for 4 hours with right leg straight. - The patient will continue to experience several additional bloody bowel movements and may continue to require additional resuscitation (as she was bleeding both before and during the procedure), however ultimately I am hopeful she will stabilize in the coming days. - If there remains clinical concern for persistent lower GI bleeding, would recommend coordinating with the interventional radiology service to perform a nuclear medicine tagged red blood cell study during daytime working hours as to expedite potential repeat angiography if the tagged study is positive. 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 Electronically Signed   By: Sandi Mariscal M.D.   On: 12/19/2020 16:01   IR  Angiogram Selective Each Additional Vessel  Result Date: 12/19/2020 INDICATION: Acute lower GI bleeding. Positive CTA with active extravasation at the level of the splenic flexure of the colon. Please perform mesenteric arteriogram and percutaneous embolization as indicated. EXAM: 1. ULTRASOUND GUIDANCE FOR ARTERIAL ACCESS 2. SELECTIVE SUPERIOR MESENTERIC ARTERIOGRAM 3. SELECTIVE INFERIOR MESENTERIC ARTERIOGRAM 4. SUB SELECTIVE LEFT COLIC ARTERIOGRAM 5. SUB SELECTIVE MARGINAL ARTERY ARTERIOGRAM AND PERCUTANEOUS COIL EMBOLIZATION 6. SUB SELECTIVE SIGMOIDAL ARTERIOGRAM COMPARISON:  CTA abdomen and pelvis-earlier same day MEDICATIONS: None ANESTHESIA/SEDATION: Moderate (conscious) sedation was employed during this procedure. A total of Versed 1 mg and Fentanyl 25 mcg was administered intravenously. Moderate Sedation Time: 62 minutes. The patient's level of consciousness and vital signs were monitored continuously by radiology nursing throughout the procedure under my direct supervision. FLUOROSCOPY TIME:  21 minutes (2,595 mGy) COMPLICATIONS: None immediate. PROCEDURE: Informed consent was obtained from the patient following explanation of the procedure, risks, benefits and alternatives. All questions were addressed. A time out was performed prior to the initiation of the procedure. Maximal barrier sterile technique utilized including caps, mask, sterile gowns, sterile gloves, large sterile drape, hand hygiene, and Betadine prep. The right femoral head was marked fluoroscopically. Under sterile conditions and local anesthesia, the right common femoral artery access was performed with a micropuncture needle. Under direct ultrasound guidance, the right common femoral was accessed with a micropuncture kit. An ultrasound image was saved for documentation purposes. This allowed for placement of a 5-French vascular sheath. A limited  arteriogram was performed through the side arm of the sheath confirming appropriate access  within the right common femoral artery. Over a Bentson wire, a Mickelson catheter was advanced the caudal aspect of the thoracic aorta where was reformed, back bled and flushed. The Mickelson catheter was then utilized to select the superior mesenteric artery and a selective superior mesenteric arteriogram was performed. The Mickelson catheter was then utilized to select the inferior mesenteric artery and a selective inferior mesenteric arteriogram was performed. Next, with the use of a fathom 14 microwire, a STC Renegade microcatheter was advanced into the left colic artery and a selective left colic arteriogram was performed. Microcatheter was advanced to the level of the marginal artery and a selective marginal artery arteriogram was performed. Microcatheter was advanced beyond the origin of a tiny distal arcade branch supplying an ill-defined area of active contrast extravasation within the splenic flexure of the colon correlating with the findings on preceding CTA. Next, a short segment of the marginal artery with percutaneously coil embolized with 2 mm and 3 mm diameter interlock coils. The microcatheter was retracted to the more central aspect of the marginal artery and a post embolization marginal arteriogram was performed. Microcatheter was retracted to the level of the left colic artery and a post embolization left colic arteriogram was performed Next, with the use of the fathom 14 microwire, the microcatheter was utilized to select the sigmoid artery and a sub selective sigmoid arteriogram was performed. The microcatheter was advanced to the opposite side of the embolized segment of the marginal artery and a sub selective marginal arteriogram was performed. Images were reviewed and the procedure was terminated. All wires, catheters and sheaths were removed from the patient. Hemostasis was achieved at the right groin access site with deployment of an ExoSeal closure device and manual compression. Dressings  were applied. The patient tolerated the procedure well without immediate post procedural complication. FINDINGS: Selective superior mesenteric arteriogram is negative for significant arterial supply to the ill-defined area of contrast extravasation within the splenic flexure of the colon. Excreted contrast is noted within a focally dilated left superior pole calyx as was demonstrated on preceding abdominal CT. Selective inferior mesenteric arteriogram demonstrates predominant arterial supply to the splenic flexure of the colon via the left colic artery. Sub selective left colic arteriogram demonstrates an ill-defined area of contrast extravasation arising from a tiny arcade branch of the marginal artery. This finding was confirmed with sub selective marginal artery arteriogram. Ultimately, a short segment of the marginal artery was percutaneously coil embolized with overlapping interlock coils traversing the origin of the tiny distal arcade branch supplying the ill-defined area of active extravasation. Post embolization marginal artery and left colic arteriograms were negative for discrete area of residual contrast extravasation. Given robust arterial supply to the splenic flexure of the colon from the sigmoid artery, a selective sigmoid arteriogram was performed as was a sub selective marginal artery arteriogram supplying the opposite side of the embolized segment of the vessel however both were negative for discrete area of collateral arterial supply to the ill-defined area of active extravasation within the splenic flexure of the colon. IMPRESSION: Technically successful percutaneous coil embolization of a short segment of the marginal artery supplying the origin of a tiny distal arcade which demonstrated active intraluminal contrast extravasation, compatible with the findings seen on preceding CTA. PLAN: - The patient is to remain flat for 4 hours with right leg straight. - The patient will continue to  experience several additional bloody bowel  movements and may continue to require additional resuscitation (as she was bleeding both before and during the procedure), however ultimately I am hopeful she will stabilize in the coming days. - If there remains clinical concern for persistent lower GI bleeding, would recommend coordinating with the interventional radiology service to perform a nuclear medicine tagged red blood cell study during daytime working hours as to expedite potential repeat angiography if the tagged study is positive. 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 Electronically Signed   By: Sandi Mariscal M.D.   On: 12/19/2020 16:01   IR Angiogram Follow Up Study  Result Date: 12/19/2020 INDICATION: Acute lower GI bleeding. Positive CTA with active extravasation at the level of the splenic flexure of the colon. Please perform mesenteric arteriogram and percutaneous embolization as indicated. EXAM: 1. ULTRASOUND GUIDANCE FOR ARTERIAL ACCESS 2. SELECTIVE SUPERIOR MESENTERIC ARTERIOGRAM 3. SELECTIVE INFERIOR MESENTERIC ARTERIOGRAM 4. SUB SELECTIVE LEFT COLIC ARTERIOGRAM 5. SUB SELECTIVE MARGINAL ARTERY ARTERIOGRAM AND PERCUTANEOUS COIL EMBOLIZATION 6. SUB SELECTIVE SIGMOIDAL ARTERIOGRAM COMPARISON:  CTA abdomen and pelvis-earlier same day MEDICATIONS: None ANESTHESIA/SEDATION: Moderate (conscious) sedation was employed during this procedure. A total of Versed 1 mg and Fentanyl 25 mcg was administered intravenously. Moderate Sedation Time: 62 minutes. The patient's level of consciousness and vital signs were monitored continuously by radiology nursing throughout the procedure under my direct supervision. FLUOROSCOPY TIME:  21 minutes (9,326 mGy) COMPLICATIONS: None immediate. PROCEDURE: Informed consent was obtained from the patient following explanation of the procedure,  risks, benefits and alternatives. All questions were addressed. A time out was performed prior to the initiation of the procedure. Maximal barrier sterile technique utilized including caps, mask, sterile gowns, sterile gloves, large sterile drape, hand hygiene, and Betadine prep. The right femoral head was marked fluoroscopically. Under sterile conditions and local anesthesia, the right common femoral artery access was performed with a micropuncture needle. Under direct ultrasound guidance, the right common femoral was accessed with a micropuncture kit. An ultrasound image was saved for documentation purposes. This allowed for placement of a 5-French vascular sheath. A limited arteriogram was performed through the side arm of the sheath confirming appropriate access within the right common femoral artery. Over a Bentson wire, a Mickelson catheter was advanced the caudal aspect of the thoracic aorta where was reformed, back bled and flushed. The Mickelson catheter was then utilized to select the superior mesenteric artery and a selective superior mesenteric arteriogram was performed. The Mickelson catheter was then utilized to select the inferior mesenteric artery and a selective inferior mesenteric arteriogram was performed. Next, with the use of a fathom 14 microwire, a STC Renegade microcatheter was advanced into the left colic artery and a selective left colic arteriogram was performed. Microcatheter was advanced to the level of the marginal artery and a selective marginal artery arteriogram was performed. Microcatheter was advanced beyond the origin of a tiny distal arcade branch supplying an ill-defined area of active contrast extravasation within the splenic flexure of the colon correlating with the findings on preceding CTA. Next, a short segment of the marginal artery with percutaneously coil embolized with 2 mm and 3 mm diameter interlock coils. The microcatheter was retracted to the more central aspect of  the marginal artery and a post embolization marginal arteriogram was performed. Microcatheter was retracted to the level of the left colic artery and a post embolization left colic arteriogram was performed Next, with the  use of the fathom 14 microwire, the microcatheter was utilized to select the sigmoid artery and a sub selective sigmoid arteriogram was performed. The microcatheter was advanced to the opposite side of the embolized segment of the marginal artery and a sub selective marginal arteriogram was performed. Images were reviewed and the procedure was terminated. All wires, catheters and sheaths were removed from the patient. Hemostasis was achieved at the right groin access site with deployment of an ExoSeal closure device and manual compression. Dressings were applied. The patient tolerated the procedure well without immediate post procedural complication. FINDINGS: Selective superior mesenteric arteriogram is negative for significant arterial supply to the ill-defined area of contrast extravasation within the splenic flexure of the colon. Excreted contrast is noted within a focally dilated left superior pole calyx as was demonstrated on preceding abdominal CT. Selective inferior mesenteric arteriogram demonstrates predominant arterial supply to the splenic flexure of the colon via the left colic artery. Sub selective left colic arteriogram demonstrates an ill-defined area of contrast extravasation arising from a tiny arcade branch of the marginal artery. This finding was confirmed with sub selective marginal artery arteriogram. Ultimately, a short segment of the marginal artery was percutaneously coil embolized with overlapping interlock coils traversing the origin of the tiny distal arcade branch supplying the ill-defined area of active extravasation. Post embolization marginal artery and left colic arteriograms were negative for discrete area of residual contrast extravasation. Given robust arterial  supply to the splenic flexure of the colon from the sigmoid artery, a selective sigmoid arteriogram was performed as was a sub selective marginal artery arteriogram supplying the opposite side of the embolized segment of the vessel however both were negative for discrete area of collateral arterial supply to the ill-defined area of active extravasation within the splenic flexure of the colon. IMPRESSION: Technically successful percutaneous coil embolization of a short segment of the marginal artery supplying the origin of a tiny distal arcade which demonstrated active intraluminal contrast extravasation, compatible with the findings seen on preceding CTA. PLAN: - The patient is to remain flat for 4 hours with right leg straight. - The patient will continue to experience several additional bloody bowel movements and may continue to require additional resuscitation (as she was bleeding both before and during the procedure), however ultimately I am hopeful she will stabilize in the coming days. - If there remains clinical concern for persistent lower GI bleeding, would recommend coordinating with the interventional radiology service to perform a nuclear medicine tagged red blood cell study during daytime working hours as to expedite potential repeat angiography if the tagged study is positive. 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 Electronically Signed   By: Sandi Mariscal M.D.   On: 12/19/2020 16:01   IR US Guide Vasc Access Right  Result Date: 12/19/2020 INDICATION: Acute lower GI bleeding. Positive CTA with active extravasation at the level of the splenic flexure of the colon. Please perform mesenteric arteriogram and percutaneous embolization as indicated. EXAM: 1. ULTRASOUND GUIDANCE FOR ARTERIAL ACCESS 2. SELECTIVE SUPERIOR MESENTERIC ARTERIOGRAM 3. SELECTIVE INFERIOR  MESENTERIC ARTERIOGRAM 4. SUB SELECTIVE LEFT COLIC ARTERIOGRAM 5. SUB SELECTIVE MARGINAL ARTERY ARTERIOGRAM AND PERCUTANEOUS COIL EMBOLIZATION 6. SUB SELECTIVE SIGMOIDAL ARTERIOGRAM COMPARISON:  CTA abdomen and pelvis-earlier same day MEDICATIONS: None ANESTHESIA/SEDATION: Moderate (conscious) sedation was employed during this procedure. A total of Versed 1 mg and Fentanyl 25 mcg was administered intravenously. Moderate Sedation Time:  62 minutes. The patient's level of consciousness and vital signs were monitored continuously by radiology nursing throughout the procedure under my direct supervision. FLUOROSCOPY TIME:  21 minutes (7,741 mGy) COMPLICATIONS: None immediate. PROCEDURE: Informed consent was obtained from the patient following explanation of the procedure, risks, benefits and alternatives. All questions were addressed. A time out was performed prior to the initiation of the procedure. Maximal barrier sterile technique utilized including caps, mask, sterile gowns, sterile gloves, large sterile drape, hand hygiene, and Betadine prep. The right femoral head was marked fluoroscopically. Under sterile conditions and local anesthesia, the right common femoral artery access was performed with a micropuncture needle. Under direct ultrasound guidance, the right common femoral was accessed with a micropuncture kit. An ultrasound image was saved for documentation purposes. This allowed for placement of a 5-French vascular sheath. A limited arteriogram was performed through the side arm of the sheath confirming appropriate access within the right common femoral artery. Over a Bentson wire, a Mickelson catheter was advanced the caudal aspect of the thoracic aorta where was reformed, back bled and flushed. The Mickelson catheter was then utilized to select the superior mesenteric artery and a selective superior mesenteric arteriogram was performed. The Mickelson catheter was then utilized to select the inferior  mesenteric artery and a selective inferior mesenteric arteriogram was performed. Next, with the use of a fathom 14 microwire, a STC Renegade microcatheter was advanced into the left colic artery and a selective left colic arteriogram was performed. Microcatheter was advanced to the level of the marginal artery and a selective marginal artery arteriogram was performed. Microcatheter was advanced beyond the origin of a tiny distal arcade branch supplying an ill-defined area of active contrast extravasation within the splenic flexure of the colon correlating with the findings on preceding CTA. Next, a short segment of the marginal artery with percutaneously coil embolized with 2 mm and 3 mm diameter interlock coils. The microcatheter was retracted to the more central aspect of the marginal artery and a post embolization marginal arteriogram was performed. Microcatheter was retracted to the level of the left colic artery and a post embolization left colic arteriogram was performed Next, with the use of the fathom 14 microwire, the microcatheter was utilized to select the sigmoid artery and a sub selective sigmoid arteriogram was performed. The microcatheter was advanced to the opposite side of the embolized segment of the marginal artery and a sub selective marginal arteriogram was performed. Images were reviewed and the procedure was terminated. All wires, catheters and sheaths were removed from the patient. Hemostasis was achieved at the right groin access site with deployment of an ExoSeal closure device and manual compression. Dressings were applied. The patient tolerated the procedure well without immediate post procedural complication. FINDINGS: Selective superior mesenteric arteriogram is negative for significant arterial supply to the ill-defined area of contrast extravasation within the splenic flexure of the colon. Excreted contrast is noted within a focally dilated left superior pole calyx as was demonstrated  on preceding abdominal CT. Selective inferior mesenteric arteriogram demonstrates predominant arterial supply to the splenic flexure of the colon via the left colic artery. Sub selective left colic arteriogram demonstrates an ill-defined area of contrast extravasation arising from a tiny arcade branch of the marginal artery. This finding was confirmed with sub selective marginal artery arteriogram. Ultimately, a short segment of the marginal artery was percutaneously coil embolized with overlapping interlock coils traversing the origin of the tiny distal arcade branch supplying the ill-defined area of active extravasation. Post  embolization marginal artery and left colic arteriograms were negative for discrete area of residual contrast extravasation. Given robust arterial supply to the splenic flexure of the colon from the sigmoid artery, a selective sigmoid arteriogram was performed as was a sub selective marginal artery arteriogram supplying the opposite side of the embolized segment of the vessel however both were negative for discrete area of collateral arterial supply to the ill-defined area of active extravasation within the splenic flexure of the colon. IMPRESSION: Technically successful percutaneous coil embolization of a short segment of the marginal artery supplying the origin of a tiny distal arcade which demonstrated active intraluminal contrast extravasation, compatible with the findings seen on preceding CTA. PLAN: - The patient is to remain flat for 4 hours with right leg straight. - The patient will continue to experience several additional bloody bowel movements and may continue to require additional resuscitation (as she was bleeding both before and during the procedure), however ultimately I am hopeful she will stabilize in the coming days. - If there remains clinical concern for persistent lower GI bleeding, would recommend coordinating with the interventional radiology service to perform a  nuclear medicine tagged red blood cell study during daytime working hours as to expedite potential repeat angiography if the tagged study is positive. 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 Electronically Signed   By: Sandi Mariscal M.D.   On: 12/19/2020 16:01   IR EMBO ART  VEN HEMORR LYMPH EXTRAV  INC GUIDE ROADMAPPING  Result Date: 12/19/2020 INDICATION: Acute lower GI bleeding. Positive CTA with active extravasation at the level of the splenic flexure of the colon. Please perform mesenteric arteriogram and percutaneous embolization as indicated. EXAM: 1. ULTRASOUND GUIDANCE FOR ARTERIAL ACCESS 2. SELECTIVE SUPERIOR MESENTERIC ARTERIOGRAM 3. SELECTIVE INFERIOR MESENTERIC ARTERIOGRAM 4. SUB SELECTIVE LEFT COLIC ARTERIOGRAM 5. SUB SELECTIVE MARGINAL ARTERY ARTERIOGRAM AND PERCUTANEOUS COIL EMBOLIZATION 6. SUB SELECTIVE SIGMOIDAL ARTERIOGRAM COMPARISON:  CTA abdomen and pelvis-earlier same day MEDICATIONS: None ANESTHESIA/SEDATION: Moderate (conscious) sedation was employed during this procedure. A total of Versed 1 mg and Fentanyl 25 mcg was administered intravenously. Moderate Sedation Time: 62 minutes. The patient's level of consciousness and vital signs were monitored continuously by radiology nursing throughout the procedure under my direct supervision. FLUOROSCOPY TIME:  21 minutes (2,671 mGy) COMPLICATIONS: None immediate. PROCEDURE: Informed consent was obtained from the patient following explanation of the procedure, risks, benefits and alternatives. All questions were addressed. A time out was performed prior to the initiation of the procedure. Maximal barrier sterile technique utilized including caps, mask, sterile gowns, sterile gloves, large sterile drape, hand hygiene, and Betadine prep. The right femoral head was marked fluoroscopically. Under sterile conditions  and local anesthesia, the right common femoral artery access was performed with a micropuncture needle. Under direct ultrasound guidance, the right common femoral was accessed with a micropuncture kit. An ultrasound image was saved for documentation purposes. This allowed for placement of a 5-French vascular sheath. A limited arteriogram was performed through the side arm of the sheath confirming appropriate access within the right common femoral artery. Over a Bentson wire, a Mickelson catheter was advanced the caudal aspect of the thoracic aorta where was reformed, back bled and flushed. The Mickelson catheter was then utilized to select the superior mesenteric artery and a selective superior mesenteric arteriogram was performed. The Mickelson catheter was then utilized to select the inferior mesenteric artery and a selective  inferior mesenteric arteriogram was performed. Next, with the use of a fathom 14 microwire, a STC Renegade microcatheter was advanced into the left colic artery and a selective left colic arteriogram was performed. Microcatheter was advanced to the level of the marginal artery and a selective marginal artery arteriogram was performed. Microcatheter was advanced beyond the origin of a tiny distal arcade branch supplying an ill-defined area of active contrast extravasation within the splenic flexure of the colon correlating with the findings on preceding CTA. Next, a short segment of the marginal artery with percutaneously coil embolized with 2 mm and 3 mm diameter interlock coils. The microcatheter was retracted to the more central aspect of the marginal artery and a post embolization marginal arteriogram was performed. Microcatheter was retracted to the level of the left colic artery and a post embolization left colic arteriogram was performed Next, with the use of the fathom 14 microwire, the microcatheter was utilized to select the sigmoid artery and a sub selective sigmoid arteriogram was  performed. The microcatheter was advanced to the opposite side of the embolized segment of the marginal artery and a sub selective marginal arteriogram was performed. Images were reviewed and the procedure was terminated. All wires, catheters and sheaths were removed from the patient. Hemostasis was achieved at the right groin access site with deployment of an ExoSeal closure device and manual compression. Dressings were applied. The patient tolerated the procedure well without immediate post procedural complication. FINDINGS: Selective superior mesenteric arteriogram is negative for significant arterial supply to the ill-defined area of contrast extravasation within the splenic flexure of the colon. Excreted contrast is noted within a focally dilated left superior pole calyx as was demonstrated on preceding abdominal CT. Selective inferior mesenteric arteriogram demonstrates predominant arterial supply to the splenic flexure of the colon via the left colic artery. Sub selective left colic arteriogram demonstrates an ill-defined area of contrast extravasation arising from a tiny arcade branch of the marginal artery. This finding was confirmed with sub selective marginal artery arteriogram. Ultimately, a short segment of the marginal artery was percutaneously coil embolized with overlapping interlock coils traversing the origin of the tiny distal arcade branch supplying the ill-defined area of active extravasation. Post embolization marginal artery and left colic arteriograms were negative for discrete area of residual contrast extravasation. Given robust arterial supply to the splenic flexure of the colon from the sigmoid artery, a selective sigmoid arteriogram was performed as was a sub selective marginal artery arteriogram supplying the opposite side of the embolized segment of the vessel however both were negative for discrete area of collateral arterial supply to the ill-defined area of active extravasation  within the splenic flexure of the colon. IMPRESSION: Technically successful percutaneous coil embolization of a short segment of the marginal artery supplying the origin of a tiny distal arcade which demonstrated active intraluminal contrast extravasation, compatible with the findings seen on preceding CTA. PLAN: - The patient is to remain flat for 4 hours with right leg straight. - The patient will continue to experience several additional bloody bowel movements and may continue to require additional resuscitation (as she was bleeding both before and during the procedure), however ultimately I am hopeful she will stabilize in the coming days. - If there remains clinical concern for persistent lower GI bleeding, would recommend coordinating with the interventional radiology service to perform a nuclear medicine tagged red blood cell study during daytime working hours as to expedite potential repeat angiography if the tagged study is positive. - While presumably  secondary to diverticular disease, repeat colonoscopy after the resolution of acute symptoms is advised to exclude the presence of an underlying mass/lesion. Electronically Signed   By: Sandi Mariscal M.D.   On: 12/19/2020 16:01   CT ANGIO GI BLEED  Result Date: 12/19/2020 CLINICAL DATA:  Diverticulosis, recurrent diverticular hemorrhage, continued hematochezia EXAM: CTA ABDOMEN AND PELVIS WITHOUT AND WITH CONTRAST TECHNIQUE: Multidetector CT imaging of the abdomen and pelvis was performed using the standard protocol during bolus administration of intravenous contrast. Multiplanar reconstructed images and MIPs were obtained and reviewed to evaluate the vascular anatomy. CONTRAST:  142m OMNIPAQUE IOHEXOL 350 MG/ML SOLN COMPARISON:  CT pelvis, 08/17/2018 FINDINGS: VASCULAR Moderate calcific aortic atherosclerosis. Normal contour and caliber of the abdominal aorta. 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 Otherwise standard branching pattern of the abdominal aorta. Atherosclerosis at the branch vessel origins without significant stenosis. There is an intraluminal arterial contrast blush within the distal transverse colon, just proximal to the splenic flexure, with multiple diverticula in this vicinity (series 7, image 36, series 12, image 66). Review of the MIP images confirms the above findings. NON-VASCULAR Lower chest: No acute abnormality. Hepatobiliary: No focal liver abnormality is seen. No gallstones, gallbladder wall thickening, or biliary dilatation. Pancreas: Unremarkable. No pancreatic ductal dilatation or surrounding inflammatory changes. Spleen: Normal in size without focal abnormality. Adrenals/Urinary Tract: Adrenal glands are unremarkable. Left-sided parapelvic cyst. Kidneys are otherwise normal, without renal calculi, solid lesion, or hydronephrosis. Bladder is unremarkable. Stomach/Bowel: Stomach is within normal limits. Appendix appears normal. No evidence of bowel wall thickening, distention, or inflammatory changes. Lymphatic: No significant vascular findings are present. No enlarged abdominal or pelvic lymph nodes. Reproductive: Uterus and bilateral adnexa are unremarkable. Other: No abdominal wall hernia or abnormality. No abdominopelvic ascites. Musculoskeletal: No acute or significant osseous findings. IMPRESSION: 1. Intraluminal arterial contrast blush, localizing diverticular hemorrhage to the distal transverse colon just proximal to the splenic flexure. 2.  Pancolonic diverticulosis. 3.  Aortic atherosclerosis. These results will be called to the ordering clinician or representative by the Radiologist Assistant, and communication documented in the PACS or [employer] Electronically Signed   By: AEddie CandleM.D.   On: 12/19/2020 10:43    Labs:  CBC: Recent Labs    12/19/20 0144 12/19/20 1448  12/20/20 0028 12/20/20 1143 12/21/20 0141 12/21/20 0817  WBC 8.6  --   --  12.4* 13.4* 11.9*  HGB 8.7*   < > 7.6* 6.8* 8.3* 8.2*  HCT 26.6*   < > 22.5* 20.9* 25.5* 25.0*  PLT 132*  --   --  145* 133* 144*   < > = values in this interval not displayed.    COAGS: Recent Labs    12/18/20 1439  INR 1.2    BMP: Recent Labs    12/18/20 1439 12/19/20 0144 12/20/20 0028 12/21/20 0141  NA 138 138 137 138  K 3.9 3.5 3.2* 3.2*  CL 104 105 107 107  CO2 '27 26 22 24  ' GLUCOSE 125* 111* 118* 108*  BUN '15 17 10 ' 7*  CALCIUM 8.5* 8.3* 7.7* 7.8*  CREATININE 0.53 0.53 0.49 0.50  GFRNONAA >60 >60 >60 >60    LIVER FUNCTION TESTS: Recent Labs    12/18/20 1439  BILITOT 0.9  AST 19  ALT 12  ALKPHOS 36*  PROT 5.4*  ALBUMIN 3.2*    Assessment and Plan: 80  yo female with LGI bleed who presented with hgb of 6.4 and positive CTA abdomen/pelvis; s/p embolization of a short segment of the marginal artery supplying the splenic flexure of the colon with IR on 6/21.   Hgb dropped to 6.8 yesterday despite embolization with IR the day before.  Pt received 1 PRBC, hgb 8.3 this morning.  VS this morning showed tachypnea.   Patient states she is having mild headache this morning but feeling fine other than that.   IR was contacted by GI and [facility] this morning for possible re-embolizing due to hgb dropping to 6.8 again last night.  Discussed with Dr. Vernard Gambles, CTA abdomen pelvic could be obtained to re-evaluate the source of persistent bleeding; however, colonoscopy with cauterization could be more definitive treatment for the persistent GI bleed as percutaneous embolization failed to treat the GI bleed.   GI and TRH notified above recommendation.  Will defer the timing of repeat CTA A/P to [facility].   Further treatment plan per TRH/GI  Appreciate and agree with the plan.  IR to follow.    Electronically Signed: Tera Mater, PA-C 12/21/2020, 10:25 AM   I spent a total of 15 Minutes at the the  patient's bedside AND on the patient's hospital floor or unit, greater than 50% of which was counseling/coordinating care for GI bleed.

## 2020-12-22 LAB — BASIC METABOLIC PANEL
Anion gap: 4 — ABNORMAL LOW (ref 5–15)
BUN: <5 mg/dL — ABNORMAL LOW (ref 8–23)
CO2: 25 mmol/L (ref 22–32)
Calcium: 7.7 mg/dL — ABNORMAL LOW (ref 8.9–10.3)
Chloride: 104 mmol/L (ref 98–111)
Creatinine, Ser: 0.41 mg/dL — ABNORMAL LOW (ref 0.44–1.00)
GFR, Estimated: >60 mL/min (ref >60)
Glucose, Bld: 129 mg/dL — ABNORMAL HIGH (ref 70–99)
Potassium: 3.8 mmol/L (ref 3.5–5.1)
Sodium: 133 mmol/L — ABNORMAL LOW (ref 135–145)

## 2020-12-22 LAB — CBC
HCT: 23.8 % — ABNORMAL LOW (ref 36.0–46.0)
HCT: 26.6 % — ABNORMAL LOW (ref 36.0–46.0)
Hemoglobin: 7.8 g/dL — ABNORMAL LOW (ref 12.0–15.0)
Hemoglobin: 8.6 g/dL — ABNORMAL LOW (ref 12.0–15.0)
MCH: 29.2 pg (ref 26.0–34.0)
MCH: 29.3 pg (ref 26.0–34.0)
MCHC: 32.8 g/dL (ref 30.0–36.0)
MCHC: 32.3 g/dL (ref 30.0–36.0)
MCV: 89.1 fL (ref 80.0–100.0)
MCV: 90.5 fL (ref 80.0–100.0)
Platelets: 150 10*3/uL (ref 150–400)
Platelets: 182 10*3/uL (ref 150–400)
RBC: 2.67 MIL/uL — ABNORMAL LOW (ref 3.87–5.11)
RBC: 2.94 MIL/uL — ABNORMAL LOW (ref 3.87–5.11)
RDW: 16.5 % — ABNORMAL HIGH (ref 11.5–15.5)
RDW: 16.6 % — ABNORMAL HIGH (ref 11.5–15.5)
WBC: 9.9 10*3/uL (ref 4.0–10.5)
WBC: 9.8 10*3/uL (ref 4.0–10.5)
nRBC: 0.3 % — ABNORMAL HIGH (ref 0.0–0.2)
nRBC: 0.4 % — ABNORMAL HIGH (ref 0.0–0.2)

## 2020-12-22 LAB — MAGNESIUM: Magnesium: 1.9 mg/dL (ref 1.7–2.4)

## 2020-12-22 NOTE — Plan of Care (Signed)

## 2020-12-22 NOTE — Evaluation (Signed)
Occupational Therapy Evaluation Patient Details Name: [name] MRN: [id number] DOB: 07-24-1940 Today's Date: 12/22/2020    History of Present Illness 80 y.o. female presented 12/18/20 with rectal bleed with Hgb 6.4. Underwent mesenteric angio with embolization of a short segment of the marginal artery. 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   Clinical Impression   Pt admitted for concerns listed above. PTA pt reported that she recieves assist with dressing and bathing from [facility], 4x per week, and she is able to ambulate with a RW. At this time, pt is unable to stand from recliner without mod-max assist or take more than 3 steps forwards, without beginning to lean backwards, requiring max support to stay upright. Pt fell back into recliner when attempting to take the 3 steps backwards to return to sitting. Pt would benefit greatly from skilled intensive therapies, however she is refusing them at this time. Acute OT will continue to work with pt and address concerns listed below.     Follow Up Recommendations  SNF (PT denying, will need Max HHOT, as well as continued supervisionand support at home.)    Equipment Recommendations  None recommended by OT    Recommendations for Other Services       Precautions / Restrictions Precautions Precautions: Fall;Other (comment) Precaution Comments: watch HR, h/o vertigo Restrictions Weight Bearing Restrictions: No      Mobility Bed Mobility Overal bed mobility: Needs Assistance Bed Mobility: Supine to Sit     Supine to sit: Mod assist;HOB elevated     General bed mobility comments: Up in recliner on arrival    Transfers Overall transfer level: Needs assistance Equipment used: Rolling walker (2 wheeled) Transfers: Sit to/from Stand Sit to Stand: Max assist;Mod assist         General transfer comment: Pt required  max-mod A for safety with standing, pt unable to take many steps forward due to knee pains.    Balance Overall balance assessment: Needs assistance Sitting-balance support: Feet supported;Bilateral upper extremity supported Sitting balance-Leahy Scale: Fair     Standing balance support: Bilateral upper extremity supported;During functional activity Standing balance-Leahy Scale: Poor                             ADL either performed or assessed with clinical judgement   ADL Overall ADL's : Needs assistance/impaired Eating/Feeding: Set up;Sitting   Grooming: Set up;Sitting   Upper Body Bathing: Set up;Sitting   Lower Body Bathing: Moderate assistance;Maximal assistance;Sitting/lateral leans;Sit to/from stand   Upper Body Dressing : Set up;Sitting   Lower Body Dressing: Moderate assistance;Maximal assistance;Sitting/lateral leans;Sit to/from stand   Toilet Transfer: Maximal assistance;Stand-pivot   Toileting- Clothing Manipulation and Hygiene: Maximal assistance;Sit to/from stand;Sitting/lateral lean   Tub/ Shower Transfer: Maximal assistance;+2 for safety/equipment   Functional mobility during ADLs: Maximal assistance;Rolling walker General ADL Comments: Pt completed  all ADL's asked of her with max A for mobility, which includes all transfers and LB ADL's that require standing.     Vision Baseline Vision/History: Wears glasses Wears Glasses: At all times Patient Visual Report: Blurring of vision Vision Assessment?: No apparent visual deficits Additional Comments: Pt states sthat her eyes have been getting progressively worse lateley     Perception Perception Perception Tested?: No   Praxis Praxis Praxis tested?: Not tested    Pertinent Vitals/Pain Pain Assessment: Faces Faces Pain Scale: Hurts even  more Pain Location: generalized Pain Descriptors / Indicators: Grimacing;Discomfort Pain Intervention(s): Limited activity within patient's  tolerance;Monitored during session;Repositioned     Hand Dominance Right   Extremity/Trunk Assessment Upper Extremity Assessment Upper Extremity Assessment: Generalized weakness   Lower Extremity Assessment Lower Extremity Assessment: Defer to PT evaluation   Cervical / Trunk Assessment Cervical / Trunk Assessment: Kyphotic   Communication Communication Communication: No difficulties   Cognition Arousal/Alertness: Awake/alert Behavior During Therapy: WFL for tasks assessed/performed Overall Cognitive Status: Within Functional Limits for tasks assessed                                     General Comments  HR seated 100, HR with standing and taking a few steps 129, HR 3 mins after sitting, 99.    Exercises     Shoulder Instructions      Home Living Family/patient expects to be discharged to:: Private residence Living Arrangements: Spouse/significant other Available Help at Discharge: Family;Available 24 hours/day;Personal care attendant Type of Home: House Home Access: Stairs to enter [employer] of Steps: 3 Entrance Stairs-Rails: Left Home Layout: One level     Bathroom Shower/Tub: Occupational psychologist: Standard Bathroom Accessibility: Yes How Accessible: Accessible via walker Home Equipment: [place] - 2 wheels;Walker - 4 wheels;Shower seat - built in;Cane - single point          Prior Functioning/Environment Level of Independence: Needs assistance  Gait / Transfers Assistance Needed: mod I mobility using RW vs no AD in house, cane on steps, and rollator in community ADL's / Homemaking Assistance Needed: PCA ([facility]) 4x week to assist with showering/hair wash            OT Problem List: Decreased strength;Decreased range of motion;Impaired balance (sitting and/or standing);Decreased coordination;Decreased safety awareness;Decreased knowledge of use of DME or AE;Cardiopulmonary status limiting activity;Obesity       OT Treatment/Interventions: Self-care/ADL training;Therapeutic exercise;DME and/or AE instruction;Balance training;Patient/family education    OT Goals(Current goals can be found in the care plan section) Acute Rehab OT Goals Patient Stated Goal: home OT Goal Formulation: With patient Time For Goal Achievement: 01/05/21 Potential to Achieve Goals: Fair  OT Frequency: Min 2X/week   Barriers to D/C:            Co-evaluation              AM-PAC OT "6 Clicks" Daily Activity     Outcome Measure Help from another person eating meals?: A Little Help from another person taking care of personal grooming?: A Little Help from another person toileting, which includes using toliet, bedpan, or urinal?: A Lot Help from another person bathing (including washing, rinsing, drying)?: A Lot Help from another person to put on and taking off regular upper body clothing?: A Little Help from another person to put on and taking off regular lower body clothing?: A Lot 6 Click Score: 15   End of Session Equipment Utilized During Treatment: Gait belt;Rolling walker Nurse Communication: Mobility status  Activity Tolerance:   Patient left: in chair;with call bell/phone within reach;with bed alarm set  OT Visit Diagnosis: Unsteadiness on feet (R26.81);Other abnormalities of gait and mobility (R26.89);Muscle weakness (generalized) (M62.81)                Time: 0973-5329 OT Time Calculation (min): 23 min Charges:  OT General Charges $OT Visit: 1 Visit OT Evaluation $OT Eval Moderate Complexity: 1  Mod OT Treatments $Self Care/Home Management : 8-22 mins  Maryam Feely H., OTR/L Acute Rehabilitation  Tarrie Mcmichen Elane Yolanda Bonine 12/22/2020, 11:54 AM

## 2020-12-22 NOTE — Progress Notes (Signed)
No evident bleeding.  The patient had 1 bowel movement earlier today, reportedly nonbloody in character.  Hemoglobin is just mildly decreased (8.2 to 7.8) over the past 24 hours, and is essentially unchanged from yesterday afternoon.  Impression: Quiescent diverticular bleeding  Recommendation:  1.  I have advanced the patient's diet  2.  I think discharge tomorrow would be reasonable if the patient shows no evidence of further bleeding.  However, since the patient does appear to have slightly replied since her embolization on Tuesday, an additional day of observation after tomorrow would not be unreasonable.  No special dietary restriction is needed from the GI tract standpoint.  3.  I will sign off.  Please call us if further input from [place] is desired.  4.  Concerning follow-up, the patient has an excellent primary care physician, Dr. Madelyn Brunner, and I would recommend that the patient see him about 1 or 2 weeks following discharge to recheck a hemoglobin level, perhaps Hemoccult status, and overall condition.  Follow-up in our office is not required, although we would be happy to see the patient if the patient's primary care physician is unavailable or would prefer for [place] to see the patient.  5.  Incidentally, the patient apparently was having some swallowing issues which are not a major consideration at the present time, but apparently outpatient referral to GI was being considered.  Again, I will leave this to the discretion of the patient's primary physician.  I did give the patient my card in the event that she does end up getting referred to our office.  Cleotis Nipper, M.D. Pager [phone] If no answer or after 5 PM call [phone]

## 2020-12-22 NOTE — Progress Notes (Signed)
Progress Note    FREDNA STRICKER   TKZ:[id number]  DOB: [birth year]  DOA: 12/18/2020     4  PCP: [name], MD  CC: rectal bleeding  Hospital Course: 80 y.o. 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   Iron stores also low and she was ordered for iron transfusion.  6/21 CT angio GI scan = intraluminal arterial blush consistent with diverticular hemorrhage located in the distal transverse colon proximal to the splenic flexure.  6/21 radiology was consulted for embolization. 6/22 stabilized but then dark red blood from rectum hemoglobin dropped again--Transfused 1 more unit--GI made aware and reconsulted-discussion with them reveals probably no other IR intervention at this time  Patient seems to have stabilized over the past 48 hours and the plan is to discharge patient home with max home health patient-she will need PTR to take her home  Interval History:   Doing well-eating full meals without nausea vomiting-3 stools today all nonbloody No chest pain Absolutely refuses to go to skilled facility-I had a long discussion with the patient her supports at home and she still persistent and wanting to go home on discharge   ROS: Constitutional: negative for chills and fevers, Respiratory: negative for cough, Cardiovascular: negative for chest pain, and Gastrointestinal: negative for abdominal pain  Assessment & Plan:  Lower GI bleed secondary to diverticular bleed most likely -hemoglobin 6.4 g/dL on admission -Given 2 units PRBC and iron infusion also ordered -Transfused urine 1 more unit PRBC 6/22 -Patient's bleeding appears to have stopped-both GI and IR have  signed off -Repeat labs in a.m.-if stable would consider discharge home  Anemia of acute blood loss secondary to diverticular bleed - Hemoglobin has now stabilized in the 7 range- - Should get iron studies in 1 to 2 weeks at PCP office  Hypertension - Propranolol held from admission -As an outpatient can reinitiate  Leukocytosis not otherwise specified - No fever and leukocytosis resolved on its own  COPD - No signs/symptoms of exacerbation  Anxiety/depression - Continue Xanax and Zoloft  Persistent hyp0kalemia - Saline lock D5 with K -Repeat labs 1 more time a.m. and likely can discharge home  Antimicrobials: None currently DVT prophylaxis: SCDs Start: 12/18/20 1813 Code Status:   Code Status: Full Code Family Communication: None present patient wants to update them herself  Disposition Plan: Status is: Inpatient  Remains inpatient appropriate because:Hemodynamically unstable, IV treatments appropriate due to intensity of illness or inability to take PO, Inpatient level of care appropriate due to severity of illness, and GIB  Dispo: The patient is from: Home              Anticipated d/c is to: Home              Patient currently is not medically stable to d/c.   Difficult to place patient No      Risk of unplanned readmission score: Unplanned Admission- Pilot do not use: 15.88   Objective: Blood pressure 131/76, pulse 85, temperature 98.3 F (36.8 C), temperature source Oral, resp. rate 19, height 5\' 2"  (1.575 m), weight 83.6 kg, SpO2 95 %.  Examination:  Awake oriented pleasant no distress coherent S1-S2 slightly tachycardic abdomen on monitors no murmur-telemetry discontinued  today Abdomen soft slightly bloated no rebound no guarding No lower extremity edema ROM intact Neurologically intact moving 4 limbs equally although quite weak    Consultants:  GI IR  Procedures:  IR embolization: 12/19/20  Data Reviewed: I have personally reviewed following  labs and imaging studies Results for orders placed or performed during the hospital encounter of 12/18/20 (from the past 24 hour(s))  Basic metabolic panel     Status: Abnormal   Collection Time: 12/22/20  6:19 AM  Result Value Ref Range   Sodium 133 (L) 135 - 145 mmol/L   Potassium 3.8 3.5 - 5.1 mmol/L   Chloride 104 98 - 111 mmol/L   CO2 25 22 - 32 mmol/L   Glucose, Bld 129 (H) 70 - 99 mg/dL   BUN <5 (L) 8 - 23 mg/dL   Creatinine, Ser 0.41 (L) 0.44 - 1.00 mg/dL   Calcium 7.7 (L) 8.9 - 10.3 mg/dL   GFR, Estimated >60 >60 mL/min   Anion gap 4 (L) 5 - 15  Magnesium     Status: None   Collection Time: 12/22/20  6:19 AM  Result Value Ref Range   Magnesium 1.9 1.7 - 2.4 mg/dL  CBC     Status: Abnormal   Collection Time: 12/22/20  6:19 AM  Result Value Ref Range   WBC 9.9 4.0 - 10.5 K/uL   RBC 2.67 (L) 3.87 - 5.11 MIL/uL   Hemoglobin 7.8 (L) 12.0 - 15.0 g/dL   HCT 23.8 (L) 36.0 - 46.0 %   MCV 89.1 80.0 - 100.0 fL   MCH 29.2 26.0 - 34.0 pg   MCHC 32.8 30.0 - 36.0 g/dL   RDW 16.5 (H) 11.5 - 15.5 %   Platelets 150 150 - 400 K/uL   nRBC 0.3 (H) 0.0 - 0.2 %    Recent Results (from the past 240 hour(s))  SARS CORONAVIRUS 2 (TAT 6-24 HRS) Nasopharyngeal Nasopharyngeal Swab     Status: None   Collection Time: 12/18/20  5:47 PM   Specimen: Nasopharyngeal Swab  Result Value Ref Range Status   SARS Coronavirus 2 NEGATIVE NEGATIVE Final    Comment: (NOTE) SARS-CoV-2 target nucleic acids are NOT DETECTED.  The SARS-CoV-2 RNA is generally detectable in upper and lower respiratory specimens during the acute phase of infection. Negative results do not preclude SARS-CoV-2 infection, do not rule out co-infections with other pathogens, and should not be used as the sole basis for treatment or other patient management decisions. Negative results must be combined with clinical observations, patient history, and epidemiological information. The expected result is Negative.  Fact Sheet for  Patients: SugarRoll.be  Fact Sheet for Healthcare Providers: https://www.woods-mathews.com/  This test is not yet approved or cleared by the [place] FDA and  has been authorized for detection and/or diagnosis of SARS-CoV-2 by FDA under an Emergency Use Authorization (EUA). This EUA will remain  in effect (meaning this test can be used) for the duration of the COVID-19 declaration under Se ction 564(b)(1) of the Act, 21 U.S.C. section 360bbb-3(b)(1), unless the authorization is terminated or revoked sooner.  Performed at [facility], [place] [address], [place], [place] [address]      Radiology Studies: No results found. IR EMBO ART  VEN HEMORR LYMPH EXTRAV  INC GUIDE ROADMAPPING  Final Result    IR US Guide Vasc Access Right  Final Result    IR Angiogram Visceral Selective  Final Result    IR Angiogram Selective Each Additional  Vessel  Final Result    IR Angiogram Selective Each Additional Vessel  Final Result    IR Angiogram Follow Up Study  Final Result    CT ANGIO GI BLEED  Final Result      Scheduled Meds:  Chlorhexidine Gluconate Cloth  6 each Topical Daily   diclofenac Sodium  2 g Topical QID   dorzolamide-timolol  1 drop Both Eyes BID   DULoxetine  20 mg Oral Daily   guaiFENesin  600 mg Oral BID   latanoprost  1 drop Both Eyes QHS   loratadine  10 mg Oral Daily   pantoprazole  40 mg Oral Daily   polyethylene glycol  17 g Oral Daily   potassium chloride  40 mEq Oral Daily   rOPINIRole  0.5 mg Oral QHS   sertraline  100 mg Oral Daily   PRN Meds: acetaminophen, albuterol, ALPRAZolam Continuous Infusions:     LOS: 4 days  Time spent: 25 minutes   Nita Sells, MD [facility] 12/22/2020, 5:48 PM

## 2020-12-22 NOTE — Plan of Care (Signed)
  Problem: Clinical Measurements: Goal: Diagnostic test results will improve Outcome: Progressing Lab values continue to improve. Will continue to monitor.

## 2020-12-22 NOTE — Evaluation (Signed)
Physical Therapy Evaluation Patient Details Name: [name] MRN: [id number] DOB: [birth year] Today's Date: 12/22/2020   History of Present Illness  80 y.o. female presented 12/18/20 with rectal bleed with Hgb 6.4. Underwent mesenteric angio with embolization of a short segment of the marginal artery. 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   Clinical Impression  Pt admitted with above diagnosis. PTA pt lived at home with her husband, mod I mobility using AD (RW, SPC, or rollator). [facility] PCA 4x week assisted pt with showering/hair wash. Pt reports no recent falls. On eval, she required mod assist bed mobility, mod assist sit to stand, and mod assist ambulation 3' with RW. Pt fatigues quickly, limiting gait to short distance, bed to recliner. Max HR 134, 2/4 DOE. Pt currently with functional limitations due to the deficits listed below (see PT Problem List). Pt will benefit from skilled PT to increase their independence and safety with mobility to allow discharge to the venue listed below.       Follow Up Recommendations SNF;Supervision/Assistance - 24 hour (Pt declining SNF. Will need max HH services and PTAR transport, if d/c home.)    Equipment Recommendations  Wheelchair (measurements PT);Wheelchair cushion (measurements PT)    Recommendations for Other Services       Precautions / Restrictions Precautions Precautions: Fall;Other (comment) Precaution Comments: watch HR, h/o vertigo      Mobility  Bed Mobility Overal bed mobility: Needs Assistance Bed Mobility: Supine to Sit     Supine to sit: Mod assist;HOB elevated     General bed mobility comments: +rail, increased time, cues for sequencing, assist to elevate trunk, use of bed pad to scoot to EOB    Transfers Overall transfer level: Needs assistance Equipment used: Rolling walker (2 wheeled) Transfers: Sit  to/from Stand Sit to Stand: Mod assist         General transfer comment: cues for hand placement and sequencing; assist to power up and stabilize balance, increased time to stabilize initial stance  Ambulation/Gait Ambulation/Gait assistance: Mod assist Gait Distance (Feet): 3 Feet Assistive device: Rolling walker (2 wheeled) Gait Pattern/deviations: Step-through pattern;Decreased stride length Gait velocity: decreased   General Gait Details: initially demonstrating difficulty with moving LLE through swing phase. Pt stating she feels she may have pulled a muscle in R hip and having difficulty shifting her weight over the R. Pt retropulsive requiring assist to maintain balance. Fatigues quickly. Sustained HR in 120s/130s during activity. Max HR 134.  Stairs            Wheelchair Mobility    Modified Rankin (Stroke Patients Only)       Balance Overall balance assessment: Needs assistance Sitting-balance support: Feet supported;Bilateral upper extremity supported Sitting balance-Leahy Scale: Fair     Standing balance support: Bilateral upper extremity supported;During functional activity Standing balance-Leahy Scale: Poor                               Pertinent Vitals/Pain Pain Assessment: Faces Faces Pain Scale: Hurts little more Pain Location: generalized Pain Descriptors / Indicators: Grimacing;Discomfort Pain Intervention(s): Limited activity within patient's tolerance;Monitored during session;Repositioned    Home Living Family/patient expects to be discharged to:: Private residence Living Arrangements: Spouse/significant other Available Help at Discharge: Family;Available 24 hours/day;Personal care attendant Type of Home: House Home Access: Stairs to enter Entrance Stairs-Rails: Left Entrance Stairs-Number of Steps:  3 Home Layout: One level Home Equipment: Walker - 2 wheels;Walker - 4 wheels;Shower seat - built in;Cane - single point       Prior Function Level of Independence: Needs assistance   Gait / Transfers Assistance Needed: mod I mobility using RW vs no AD in house, cane on steps, and rollator in community  ADL's / Homemaking Assistance Needed: PCA ([facility]) 4x week to assist with showering/hair wash        Hand Dominance   Dominant Hand: Right    Extremity/Trunk Assessment   Upper Extremity Assessment Upper Extremity Assessment: Generalized weakness    Lower Extremity Assessment Lower Extremity Assessment: Generalized weakness    Cervical / Trunk Assessment Cervical / Trunk Assessment: Kyphotic (dowager's hump)  Communication   Communication: No difficulties  Cognition Arousal/Alertness: Awake/alert Behavior During Therapy: WFL for tasks assessed/performed Overall Cognitive Status: Within Functional Limits for tasks assessed                                        General Comments General comments (skin integrity, edema, etc.): Resting HR in bed 115. Max HR during acitivity 134. HR in recliner 105. O2 stable on RA.    Exercises     Assessment/Plan    PT Assessment Patient needs continued PT services  PT Problem List Decreased strength;Decreased mobility;Decreased activity tolerance;Pain;Decreased balance       PT Treatment Interventions Therapeutic activities;Gait training;Therapeutic exercise;Patient/family education;Balance training;Functional mobility training;Stair training    PT Goals (Current goals can be found in the Care Plan section)  Acute Rehab PT Goals Patient Stated Goal: home PT Goal Formulation: With patient Time For Goal Achievement: 01/05/21 Potential to Achieve Goals: Fair    Frequency Min 3X/week   Barriers to discharge        Co-evaluation               AM-PAC PT "6 Clicks" Mobility  Outcome Measure Help needed turning from your back to your side while in a flat bed without using bedrails?: A Lot Help needed moving from lying on  your back to sitting on the side of a flat bed without using bedrails?: A Lot Help needed moving to and from a bed to a chair (including a wheelchair)?: A Lot Help needed standing up from a chair using your arms (e.g., wheelchair or bedside chair)?: A Lot Help needed to walk in hospital room?: A Lot Help needed climbing 3-5 steps with a railing? : Total 6 Click Score: 11    End of Session Equipment Utilized During Treatment: Gait belt Activity Tolerance: Patient limited by fatigue Patient left: in chair;with call bell/phone within reach;with chair alarm set Nurse Communication: Mobility status PT Visit Diagnosis: Muscle weakness (generalized) (M62.81);Pain;Other abnormalities of gait and mobility (R26.89)    Time: 7939-0300 PT Time Calculation (min) (ACUTE ONLY): 25 min   Charges:   PT Evaluation $PT Eval Moderate Complexity: 1 Mod PT Treatments $Therapeutic Activity: 8-22 mins        Lorrin Goodell, PT  Office # [phone] Pager [phone]   Lorriane Shire 12/22/2020, 10:10 AM

## 2020-12-22 NOTE — Progress Notes (Signed)
I will be seeing the patient later today.  In the meantime, stable hemoglobin noted.  Per nursing staff note, the patient did have a bowel movement that was consistent with passage of old blood earlier this morning.  Plan: Soft diet (ordered)  Cleotis Nipper, M.D. Pager [phone] If no answer or after 5 PM call [phone]

## 2020-12-22 NOTE — Care Management Important Message (Signed)
Important Message  Patient Details  Name: [name] MRN: [id number] Date of Birth: 1941/01/23   Medicare Important Message Given:  Yes - Important Message mailed due to current National Emergency   Verbal consent obtained due to current National Emergency  Relationship to patient: Self Contact Name: [facility] Call Date: 12/22/20  Time: 1138 Phone: [phone] Outcome: No Answer/Busy Important Message mailed to: Patient address on file    Delorse Lek 12/22/2020, 11:38 AM

## 2020-12-22 NOTE — Progress Notes (Signed)
Pt did have 1 bowel movement that was dark in color and and was pudding consistency. Pt was able to get oob to bsc with staff assist x2. VS remain stable.

## 2020-12-22 NOTE — TOC Initial Note (Signed)
Transition of Care [facility]) - Initial/Assessment Note    Patient Details  Name: [name] MRN: [id number] Date of Birth: 25-Sep-1940  [facility] CM/SW Contact:    Verdell Carmine, RN Phone Number: 12/22/2020, 1:05 PM  Clinical Narrative:                  79 year old patient in from home with spouse ofr GI bleeding anemia. PT evaluation recommendation of SNF< however patient declines. Will need DME at home as well as supervision. Called patient in room, no answer will follow up  to see what DME  is needed. Recommendation of PT OT , aide  and social work for home health. CM will follow for continuing needs  Expected Discharge Plan: [place] (PT recommending SNF, pt refusing) Barriers to Discharge: Continued Medical Work up   Patient Goals and CMS Choice        Expected Discharge Plan and Services Expected Discharge Plan: [place] (PT recommending SNF, pt refusing)   Discharge Planning Services: CM Consult Post Acute Care Choice: [place] arrangements for the past 2 months: Single Family Home                           HH Arranged: PT, OT, Nurse's Aide          Prior Living Arrangements/Services Living arrangements for the past 2 months: Single Family Home Lives with:: Spouse Patient language and need for interpreter reviewed:: Yes        Need for Family Participation in Patient Care: Yes (Comment) Care giver support system in place?: Yes (comment)   Criminal Activity/Legal Involvement Pertinent to Current Situation/Hospitalization: No - Comment as needed  Activities of Daily Living Home Assistive Devices/Equipment: Walker (specify type), Oxygen ADL Screening (condition at time of admission) Patient's cognitive ability adequate to safely complete daily activities?: Yes Is the patient deaf or have difficulty hearing?: Yes Does the patient have difficulty seeing, even when wearing glasses/contacts?:  No Does the patient have difficulty concentrating, remembering, or making decisions?: No Patient able to express need for assistance with ADLs?: Yes Does the patient have difficulty dressing or bathing?: Yes Independently performs ADLs?: No Communication: Independent Dressing (OT): Needs assistance Is this a change from baseline?: Pre-admission baseline Grooming: Needs assistance Is this a change from baseline?: Pre-admission baseline Feeding: Independent Bathing: Needs assistance Is this a change from baseline?: Pre-admission baseline Toileting: Needs assistance Is this a change from baseline?: Pre-admission baseline In/Out Bed: Needs assistance Is this a change from baseline?: Pre-admission baseline Walks in Home: Needs assistance Is this a change from baseline?: Pre-admission baseline Does the patient have difficulty walking or climbing stairs?: Yes Weakness of Legs: Both Weakness of Arms/Hands: None  Permission Sought/Granted                  Emotional Assessment       Orientation: : Oriented to Self, Oriented to Place, Oriented to  Time Alcohol / Substance Use: Not Applicable Psych Involvement: No (comment)  Admission diagnosis:  Acute GI bleeding [K92.2] Lower GI bleed [K92.2] Symptomatic anemia [D64.9] Other fatigue [R53.83] Patient Active Problem List   Diagnosis Date Noted   Symptomatic anemia    DOE (dyspnea on exertion) 11/18/2017   COPD GOLD II  11/18/2017   Diverticulosis of intestine with bleeding 09/05/2017   Influenza A 08/08/2017   Acute respiratory failure with hypoxia ([place])  08/07/2017   Depression 08/07/2017   Hyperlipemia 08/07/2017   Macular degeneration 08/07/2017   Glaucoma 08/07/2017   Weakness 08/07/2017   Dehydration 08/07/2017   Lower GI bleeding 10/14/2016   Diverticulosis of colon 10/14/2016   Anxiety 10/14/2016   Lower GI bleed 10/14/2016   Acute GI bleeding    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 AT [address]. [place] [address] Phone: [phone] Fax: [phone]  [facility] [id number] - [place], [place] [address] [place] [address] [place] [address] Phone: [phone] Fax: [phone]     Social Determinants of Health ([place]) Interventions    Readmission Risk Interventions No flowsheet data found.

## 2020-12-23 LAB — CBC WITH DIFFERENTIAL/PLATELET
Abs Immature Granulocytes: 0.07 10*3/uL (ref 0.00–0.07)
Basophils Absolute: 0.1 10*3/uL (ref 0.0–0.1)
Basophils Relative: 1 %
Eosinophils Absolute: 0.3 10*3/uL (ref 0.0–0.5)
Eosinophils Relative: 3 %
HCT: 27 % — ABNORMAL LOW (ref 36.0–46.0)
Hemoglobin: 8.7 g/dL — ABNORMAL LOW (ref 12.0–15.0)
Immature Granulocytes: 1 %
Lymphocytes Relative: 14 %
Lymphs Abs: 1.4 10*3/uL (ref 0.7–4.0)
MCH: 29.2 pg (ref 26.0–34.0)
MCHC: 32.2 g/dL (ref 30.0–36.0)
MCV: 90.6 fL (ref 80.0–100.0)
Monocytes Absolute: 1.1 10*3/uL — ABNORMAL HIGH (ref 0.1–1.0)
Monocytes Relative: 11 %
Neutro Abs: 6.8 10*3/uL (ref 1.7–7.7)
Neutrophils Relative %: 70 %
Platelets: 184 10*3/uL (ref 150–400)
RBC: 2.98 MIL/uL — ABNORMAL LOW (ref 3.87–5.11)
RDW: 16.9 % — ABNORMAL HIGH (ref 11.5–15.5)
WBC: 9.7 10*3/uL (ref 4.0–10.5)
nRBC: 0.2 % (ref 0.0–0.2)

## 2020-12-23 LAB — BASIC METABOLIC PANEL
Anion gap: 10 (ref 5–15)
BUN: <5 mg/dL — ABNORMAL LOW (ref 8–23)
CO2: 26 mmol/L (ref 22–32)
Calcium: 8.4 mg/dL — ABNORMAL LOW (ref 8.9–10.3)
Chloride: 93 mmol/L — ABNORMAL LOW (ref 98–111)
Creatinine, Ser: 0.38 mg/dL — ABNORMAL LOW (ref 0.44–1.00)
GFR, Estimated: >60 mL/min (ref >60)
Glucose, Bld: 119 mg/dL — ABNORMAL HIGH (ref 70–99)
Potassium: 4.1 mmol/L (ref 3.5–5.1)
Sodium: 129 mmol/L — ABNORMAL LOW (ref 135–145)

## 2020-12-23 LAB — MAGNESIUM: Magnesium: 2 mg/dL (ref 1.7–2.4)

## 2020-12-23 MED ORDER — ROPINIROLE HCL 0.5 MG PO TABS: 0.5000 mg | ORAL_TABLET | Freq: Every day | ORAL | 3 refills | Status: DC

## 2020-12-23 MED ORDER — SERTRALINE HCL 100 MG PO TABS: 100.0000 mg | ORAL_TABLET | Freq: Every day | ORAL | 3 refills | Status: DC

## 2020-12-23 MED ORDER — DULOXETINE HCL 20 MG PO CPEP: 20.0000 mg | ORAL_CAPSULE | Freq: Every day | ORAL | 3 refills | Status: DC

## 2020-12-23 NOTE — TOC Initial Note (Addendum)
Transition of Care [facility]) - Initial/Assessment Note    Patient Details  Name: [name] MRN: [id number] Date of Birth: 1941/06/25  [facility]) CM/SW Contact:    Carles Collet, RN Phone Number: 12/23/2020, 3:03 PM  Clinical Narrative:     Damaris Schooner w patient and spouse at bedside. They confirm she will need PTAR home, verified address. Patient requests to DC to home. She states spouse is support and she also has [employer] at the house M-F for 4 hours a day. Will maximize [facility]. They request [employer]- now [place].  NO DME needs.  Spouse thought DC would be tomorrow, I provided attending w his number to review DC.            Spoke w MD, patient will DC in the morning.     Expected Discharge Plan: [place] (PT recommending SNF, pt refusing) Barriers to Discharge: Continued Medical Work up   Patient Goals and CMS Choice Patient states their goals for this hospitalization and ongoing recovery are:: to go home CMS Medicare.gov Compare Post Acute Care list provided to:: Patient Choice offered to / list presented to : Patient, Spouse  Expected Discharge Plan and Services Expected Discharge Plan: [place] (PT recommending SNF, pt refusing)   Discharge Planning Services: CM Consult Post Acute Care Choice: [place] arrangements for the past 2 months: Single Family Home Expected Discharge Date: 12/23/20               DME Arranged: N/A         HH Arranged: PT, OT, Nurse's Aide HH Agency: [facility] Date HH Agency Contacted: 12/23/20 Time HH Agency Contacted: 50 Representative spoke with at [place]: [place] Arrangements/Services Living arrangements for the past 2 months: [place] with:: Spouse Patient language and need for interpreter reviewed:: Yes        Need for Family Participation in Patient Care: Yes (Comment) Care giver support system in place?: Yes  (comment)   Criminal Activity/Legal Involvement Pertinent to Current Situation/Hospitalization: No - Comment as needed  Activities of Daily Living Home Assistive Devices/Equipment: Walker (specify type), Oxygen ADL Screening (condition at time of admission) Patient's cognitive ability adequate to safely complete daily activities?: Yes Is the patient deaf or have difficulty hearing?: Yes Does the patient have difficulty seeing, even when wearing glasses/contacts?: No Does the patient have difficulty concentrating, remembering, or making decisions?: No Patient able to express need for assistance with ADLs?: Yes Does the patient have difficulty dressing or bathing?: Yes Independently performs ADLs?: No Communication: Independent Dressing (OT): Needs assistance Is this a change from baseline?: Pre-admission baseline Grooming: Needs assistance Is this a change from baseline?: Pre-admission baseline Feeding: Independent Bathing: Needs assistance Is this a change from baseline?: Pre-admission baseline Toileting: Needs assistance Is this a change from baseline?: Pre-admission baseline In/Out Bed: Needs assistance Is this a change from baseline?: Pre-admission baseline Walks in Home: Needs assistance Is this a change from baseline?: Pre-admission baseline Does the patient have difficulty walking or climbing stairs?: Yes Weakness of Legs: Both Weakness of Arms/Hands: None  Permission Sought/Granted                  Emotional Assessment       Orientation: : Oriented to Self, Oriented to Place, Oriented to  Time Alcohol / Substance Use: Not Applicable Psych Involvement: No (comment)  Admission diagnosis:  Acute GI bleeding [K92.2]  Lower GI bleed [K92.2] Symptomatic anemia [D64.9] Other fatigue [R53.83] Patient Active Problem List   Diagnosis Date Noted   Symptomatic anemia    DOE (dyspnea on exertion) 11/18/2017   COPD GOLD II  11/18/2017   Diverticulosis of intestine  with bleeding 09/05/2017   Influenza A 08/08/2017   Acute respiratory failure with hypoxia ([place]) 08/07/2017   Depression 08/07/2017   Hyperlipemia 08/07/2017   Macular degeneration 08/07/2017   Glaucoma 08/07/2017   Weakness 08/07/2017   Dehydration 08/07/2017   Lower GI bleeding 10/14/2016   Diverticulosis of colon 10/14/2016   Anxiety 10/14/2016   Lower GI bleed 10/14/2016   Acute GI bleeding    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 AT [address]. [place] [address] Phone: [phone] Fax: [phone]  [facility] [id number] - [place], [place] [address] [place] [address] [place] [address] Phone: [phone] Fax: [phone]     Social Determinants of Health ([place]) Interventions    Readmission Risk Interventions No flowsheet data found.

## 2020-12-23 NOTE — Discharge Summary (Signed)
Physician Discharge Summary  [name] EPP:[id number] DOB: Jul 17, 1940 DOA: 12/18/2020  PCP: [name], MD  Admit date: 12/18/2020 Discharge date: 12/23/2020  Time spent: 37 minutes  Recommendations for Outpatient Follow-up:  Needs CBC Chem-12 9-week within 1 week Home health ordered on discharge for patient Continue home oxygen Needs iron studies in about 2 to 3 weeks in the outpatient setting  Discharge Diagnoses:  MAIN problem for hospitalization   Probable acute diverticular bleed  Please see below for itemized issues addressed in [place]- refer to other progress notes for clarity if needed  Discharge Condition: Fair  Diet recommendation: Heart healthy  Filed Weights   12/20/20 2325 12/21/20 0451 12/22/20 0656  Weight: 82.4 kg 82.7 kg 83.6 kg    History of present illness:  80 y.o. 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   Iron stores also low and she was ordered for iron transfusion.   6/21 CT angio GI scan = intraluminal arterial blush consistent with diverticular hemorrhage located in the distal transverse colon proximal to the splenic flexure. 6/21 radiology was consulted for embolization. 6/22 stabilized but then dark red blood from rectum hemoglobin dropped again--Transfused 1 more unit--GI made aware and reconsulted-discussion with them reveals probably no other IR intervention at this time   Patient seems to have stabilized over the past 48 hours and the plan is to discharge patient home with [facility] home health patient-she will need PTAR to take her home  Hospital Course:  Lower GI bleed secondary to diverticular bleed most likely  -hemoglobin 6.4 g/dL on admission -Given 2 units PRBC and iron infusion also ordered -Transfused urine 1 more unit PRBC 6/22 -Patient's bleeding appears to have stopped-both GI and IR have signed off -Repeat hemoglobin has remained stable and she is nearing discharge   Mild hyponatremia - Probably secondary to increased fluids versus solute in diet - Patient encouraged to restrict fluids a little bit we will recheck in a.m. -Will need outpatient follow-up labs  Ane hemoglobin has remained stable mia of acute blood loss secondary to diverticular bleed - Hemoglobin has now stabilized in the 8 range- - Should get iron studies in 1 to 2 weeks at PCP office  Hypertension - Propranolol held from admission -As an outpatient can reinitiate   Leukocytosis not otherwise specified - No fever and leukocytosis resolved on its own  COPD at baseline on 2 L of oxygen - No signs/symptoms of exacerbation  Anxiety/depression - Continue Xanax and Zoloft   Persistent hyp0kalemia - Saline lock D5 with K -Repeat labs 1 more time a.m. and likely can discharge home  Procedures:   Consultations: Dr. Cristina Gong of gastroenterology  Discharge Exam: Vitals:   12/23/20 0549 12/23/20 1158  BP: 137/67 140/75  Pulse: 70 76  Resp: 17 16  Temp: 98 F (36.7 C) 97.8 F (36.6 C)  SpO2: 95% 96%    Subj on day of d/c   Awake pleasant coherent no distress Eating drinking without any concerns no dark stool no tarry stools no nausea no vomiting no fever Feels somewhat weak tells me she has improved however from prior  General Exam on discharge  EOMI NCAT no focal deficit moving all 4 limbs equally without any distress  however effort limits some of her mobility Chest clear no added sound rales S1-S2 no murmur no rub no gallop Abdomen obese nontender nondistended no real no epigastric tenderness no organomegaly trace lower extremity edema no rash Thick neck Mallampati 4   Discharge  Instructions  Allergies as of 12/23/2020       Reactions   Morphine And Related Itching   Abaloparatide Other (See Comments)   Aspirin Other (See Comments)   Duloxetine Hcl Other (See Comments)   Cefadroxil Hives   Patient can take amoxicillin and cipro        Medication List     STOP taking these medications    cholecalciferol 1000 units tablet Commonly known as: VITAMIN D   diclofenac sodium 1 % Gel Commonly known as: VOLTAREN   ICaps Areds 2 Caps   penicillin v potassium 500 MG tablet Commonly known as: VEETID   propranolol 20 MG tablet Commonly known as: INDERAL       TAKE these medications    acetaminophen 500 MG tablet Commonly known as: TYLENOL Take 1,000 mg by mouth daily as needed for headache.   albuterol 108 (90 Base) MCG/ACT inhaler Commonly known as: VENTOLIN HFA Inhale 2 puffs into the lungs daily as needed. For seasonal allergies . Use 2 puffs 3 times daily x 4 days then back to home regimen. What changed:  when to take this additional instructions   ALPRAZolam 0.25 MG tablet Commonly known as: XANAX Take 1 tablet (0.25 mg total) by mouth daily as needed for anxiety.   dorzolamide-timolol 22.3-6.8 MG/ML ophthalmic solution Commonly known as: COSOPT Place 1 drop into both eyes 2 (two) times daily.   DULoxetine 20 MG capsule Commonly known as: CYMBALTA Take 1 capsule (20 mg total) by mouth daily. What changed:  medication strength how much to take how to take this when to take this   latanoprost 0.005 % ophthalmic solution Commonly known as: XALATAN Place 1 drop into both eyes at bedtime.   loratadine 10 MG tablet Commonly known as: CLARITIN Take 1 tablet (10 mg total) by mouth daily.   multivitamin with minerals Tabs tablet Take 1 tablet by mouth daily.   omeprazole 20 MG capsule Commonly known as: PRILOSEC Take 20 mg by mouth every evening.   OXYGEN 2lpm with sleep only  [employer]   polyethylene glycol 17 g  packet Commonly known as: MIRALAX / GLYCOLAX Take 17 g by mouth daily.   rOPINIRole 0.5 MG tablet Commonly known as: REQUIP Take 1 tablet (0.5 mg total) by mouth at bedtime.   sertraline 100 MG tablet Commonly known as: ZOLOFT Take 1 tablet (100 mg total) by mouth daily. What changed:  how much to take when to take this   simvastatin 20 MG tablet Commonly known as: ZOCOR Take 20 mg by mouth daily at 6 PM.       Allergies  Allergen Reactions   Morphine And Related Itching   Abaloparatide Other (See Comments)   Aspirin Other (See Comments)   Duloxetine Hcl Other (See Comments)   Cefadroxil Hives    Patient can take amoxicillin and cipro      The results of significant diagnostics from this hospitalization (including imaging, microbiology, ancillary and laboratory) are listed below for reference.    Significant Diagnostic Studies: IR Angiogram Visceral Selective  Result Date: 12/19/2020 INDICATION: Acute lower GI bleeding. Positive CTA with active extravasation at the level of the splenic flexure of the colon. Please perform mesenteric arteriogram and percutaneous  embolization as indicated. EXAM: 1. ULTRASOUND GUIDANCE FOR ARTERIAL ACCESS 2. SELECTIVE SUPERIOR MESENTERIC ARTERIOGRAM 3. SELECTIVE INFERIOR MESENTERIC ARTERIOGRAM 4. SUB SELECTIVE LEFT COLIC ARTERIOGRAM 5. SUB SELECTIVE MARGINAL ARTERY ARTERIOGRAM AND PERCUTANEOUS COIL EMBOLIZATION 6. SUB SELECTIVE SIGMOIDAL ARTERIOGRAM COMPARISON:  CTA abdomen and pelvis-earlier same day MEDICATIONS: None ANESTHESIA/SEDATION: Moderate (conscious) sedation was employed during this procedure. A total of Versed 1 mg and Fentanyl 25 mcg was administered intravenously. Moderate Sedation Time: 62 minutes. The patient's level of consciousness and vital signs were monitored continuously by radiology nursing throughout the procedure under my direct supervision. FLUOROSCOPY TIME:  21 minutes (9,470 mGy) COMPLICATIONS: None immediate.  PROCEDURE: Informed consent was obtained from the patient following explanation of the procedure, risks, benefits and alternatives. All questions were addressed. A time out was performed prior to the initiation of the procedure. Maximal barrier sterile technique utilized including caps, mask, sterile gowns, sterile gloves, large sterile drape, hand hygiene, and Betadine prep. The right femoral head was marked fluoroscopically. Under sterile conditions and local anesthesia, the right common femoral artery access was performed with a micropuncture needle. Under direct ultrasound guidance, the right common femoral was accessed with a micropuncture kit. An ultrasound image was saved for documentation purposes. This allowed for placement of a 5-French vascular sheath. A limited arteriogram was performed through the side arm of the sheath confirming appropriate access within the right common femoral artery. Over a Bentson wire, a Mickelson catheter was advanced the caudal aspect of the thoracic aorta where was reformed, back bled and flushed. The Mickelson catheter was then utilized to select the superior mesenteric artery and a selective superior mesenteric arteriogram was performed. The Mickelson catheter was then utilized to select the inferior mesenteric artery and a selective inferior mesenteric arteriogram was performed. Next, with the use of a fathom 14 microwire, a STC Renegade microcatheter was advanced into the left colic artery and a selective left colic arteriogram was performed. Microcatheter was advanced to the level of the marginal artery and a selective marginal artery arteriogram was performed. Microcatheter was advanced beyond the origin of a tiny distal arcade branch supplying an ill-defined area of active contrast extravasation within the splenic flexure of the colon correlating with the findings on preceding CTA. Next, a short segment of the marginal artery with percutaneously coil embolized with 2 mm  and 3 mm diameter interlock coils. The microcatheter was retracted to the more central aspect of the marginal artery and a post embolization marginal arteriogram was performed. Microcatheter was retracted to the level of the left colic artery and a post embolization left colic arteriogram was performed Next, with the use of the fathom 14 microwire, the microcatheter was utilized to select the sigmoid artery and a sub selective sigmoid arteriogram was performed. The microcatheter was advanced to the opposite side of the embolized segment of the marginal artery and a sub selective marginal arteriogram was performed. Images were reviewed and the procedure was terminated. All wires, catheters and sheaths were removed from the patient. Hemostasis was achieved at the right groin access site with deployment of an ExoSeal closure device and manual compression. Dressings were applied. The patient tolerated the procedure well without immediate post procedural complication. FINDINGS: Selective superior mesenteric arteriogram is negative for significant arterial supply to the ill-defined area of contrast extravasation within the splenic flexure of the colon. Excreted contrast is noted within a focally dilated left superior pole calyx as was demonstrated on preceding abdominal CT. Selective inferior mesenteric arteriogram demonstrates predominant arterial supply to  the splenic flexure of the colon via the left colic artery. Sub selective left colic arteriogram demonstrates an ill-defined area of contrast extravasation arising from a tiny arcade branch of the marginal artery. This finding was confirmed with sub selective marginal artery arteriogram. Ultimately, a short segment of the marginal artery was percutaneously coil embolized with overlapping interlock coils traversing the origin of the tiny distal arcade branch supplying the ill-defined area of active extravasation. Post embolization marginal artery and left colic  arteriograms were negative for discrete area of residual contrast extravasation. Given robust arterial supply to the splenic flexure of the colon from the sigmoid artery, a selective sigmoid arteriogram was performed as was a sub selective marginal artery arteriogram supplying the opposite side of the embolized segment of the vessel however both were negative for discrete area of collateral arterial supply to the ill-defined area of active extravasation within the splenic flexure of the colon. IMPRESSION: Technically successful percutaneous coil embolization of a short segment of the marginal artery supplying the origin of a tiny distal arcade which demonstrated active intraluminal contrast extravasation, compatible with the findings seen on preceding CTA. PLAN: - The patient is to remain flat for 4 hours with right leg straight. - The patient will continue to experience several additional bloody bowel movements and may continue to require additional resuscitation (as she was bleeding both before and during the procedure), however ultimately I am hopeful she will stabilize in the coming days. - If there remains clinical concern for persistent lower GI bleeding, would recommend coordinating with the interventional radiology service to perform a nuclear medicine tagged red blood cell study during daytime working hours as to expedite potential repeat angiography if the tagged study is positive. 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 Electronically Signed   By: Sandi Mariscal M.D.   On: 12/19/2020 16:01   IR Angiogram Selective Each Additional Vessel  Result Date: 12/19/2020 INDICATION: Acute lower GI bleeding. Positive CTA with active extravasation at the level of the splenic flexure of the colon. Please perform mesenteric arteriogram and percutaneous embolization as indicated.  EXAM: 1. ULTRASOUND GUIDANCE FOR ARTERIAL ACCESS 2. SELECTIVE SUPERIOR MESENTERIC ARTERIOGRAM 3. SELECTIVE INFERIOR MESENTERIC ARTERIOGRAM 4. SUB SELECTIVE LEFT COLIC ARTERIOGRAM 5. SUB SELECTIVE MARGINAL ARTERY ARTERIOGRAM AND PERCUTANEOUS COIL EMBOLIZATION 6. SUB SELECTIVE SIGMOIDAL ARTERIOGRAM COMPARISON:  CTA abdomen and pelvis-earlier same day MEDICATIONS: None ANESTHESIA/SEDATION: Moderate (conscious) sedation was employed during this procedure. A total of Versed 1 mg and Fentanyl 25 mcg was administered intravenously. Moderate Sedation Time: 62 minutes. The patient's level of consciousness and vital signs were monitored continuously by radiology nursing throughout the procedure under my direct supervision. FLUOROSCOPY TIME:  21 minutes (7,035 mGy) COMPLICATIONS: None immediate. PROCEDURE: Informed consent was obtained from the patient following explanation of the procedure, risks, benefits and alternatives. All questions were addressed. A time out was performed prior to the initiation of the procedure. Maximal barrier sterile technique utilized including caps, mask, sterile gowns, sterile gloves, large sterile drape, hand hygiene, and Betadine prep. The right femoral head was marked fluoroscopically. Under sterile conditions and local anesthesia, the right common femoral artery access was performed with a micropuncture needle. Under direct ultrasound guidance, the right common femoral was accessed with a micropuncture kit. An ultrasound image was saved for documentation purposes. This allowed for placement of a 5-French vascular sheath. A limited arteriogram was performed through the side arm  of the sheath confirming appropriate access within the right common femoral artery. Over a Bentson wire, a Mickelson catheter was advanced the caudal aspect of the thoracic aorta where was reformed, back bled and flushed. The Mickelson catheter was then utilized to select the superior mesenteric artery and a selective  superior mesenteric arteriogram was performed. The Mickelson catheter was then utilized to select the inferior mesenteric artery and a selective inferior mesenteric arteriogram was performed. Next, with the use of a fathom 14 microwire, a STC Renegade microcatheter was advanced into the left colic artery and a selective left colic arteriogram was performed. Microcatheter was advanced to the level of the marginal artery and a selective marginal artery arteriogram was performed. Microcatheter was advanced beyond the origin of a tiny distal arcade branch supplying an ill-defined area of active contrast extravasation within the splenic flexure of the colon correlating with the findings on preceding CTA. Next, a short segment of the marginal artery with percutaneously coil embolized with 2 mm and 3 mm diameter interlock coils. The microcatheter was retracted to the more central aspect of the marginal artery and a post embolization marginal arteriogram was performed. Microcatheter was retracted to the level of the left colic artery and a post embolization left colic arteriogram was performed Next, with the use of the fathom 14 microwire, the microcatheter was utilized to select the sigmoid artery and a sub selective sigmoid arteriogram was performed. The microcatheter was advanced to the opposite side of the embolized segment of the marginal artery and a sub selective marginal arteriogram was performed. Images were reviewed and the procedure was terminated. All wires, catheters and sheaths were removed from the patient. Hemostasis was achieved at the right groin access site with deployment of an ExoSeal closure device and manual compression. Dressings were applied. The patient tolerated the procedure well without immediate post procedural complication. FINDINGS: Selective superior mesenteric arteriogram is negative for significant arterial supply to the ill-defined area of contrast extravasation within the splenic flexure  of the colon. Excreted contrast is noted within a focally dilated left superior pole calyx as was demonstrated on preceding abdominal CT. Selective inferior mesenteric arteriogram demonstrates predominant arterial supply to the splenic flexure of the colon via the left colic artery. Sub selective left colic arteriogram demonstrates an ill-defined area of contrast extravasation arising from a tiny arcade branch of the marginal artery. This finding was confirmed with sub selective marginal artery arteriogram. Ultimately, a short segment of the marginal artery was percutaneously coil embolized with overlapping interlock coils traversing the origin of the tiny distal arcade branch supplying the ill-defined area of active extravasation. Post embolization marginal artery and left colic arteriograms were negative for discrete area of residual contrast extravasation. Given robust arterial supply to the splenic flexure of the colon from the sigmoid artery, a selective sigmoid arteriogram was performed as was a sub selective marginal artery arteriogram supplying the opposite side of the embolized segment of the vessel however both were negative for discrete area of collateral arterial supply to the ill-defined area of active extravasation within the splenic flexure of the colon. IMPRESSION: Technically successful percutaneous coil embolization of a short segment of the marginal artery supplying the origin of a tiny distal arcade which demonstrated active intraluminal contrast extravasation, compatible with the findings seen on preceding CTA. PLAN: - The patient is to remain flat for 4 hours with right leg straight. - The patient will continue to experience several additional bloody bowel movements and may continue to require additional resuscitation (  as she was bleeding both before and during the procedure), however ultimately I am hopeful she will stabilize in the coming days. - If there remains clinical concern for  persistent lower GI bleeding, would recommend coordinating with the interventional radiology service to perform a nuclear medicine tagged red blood cell study during daytime working hours as to expedite potential repeat angiography if the tagged study is positive. 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 Electronically Signed   By: Sandi Mariscal M.D.   On: 12/19/2020 16:01   IR Angiogram Selective Each Additional Vessel  Result Date: 12/19/2020 INDICATION: Acute lower GI bleeding. Positive CTA with active extravasation at the level of the splenic flexure of the colon. Please perform mesenteric arteriogram and percutaneous embolization as indicated. EXAM: 1. ULTRASOUND GUIDANCE FOR ARTERIAL ACCESS 2. SELECTIVE SUPERIOR MESENTERIC ARTERIOGRAM 3. SELECTIVE INFERIOR MESENTERIC ARTERIOGRAM 4. SUB SELECTIVE LEFT COLIC ARTERIOGRAM 5. SUB SELECTIVE MARGINAL ARTERY ARTERIOGRAM AND PERCUTANEOUS COIL EMBOLIZATION 6. SUB SELECTIVE SIGMOIDAL ARTERIOGRAM COMPARISON:  CTA abdomen and pelvis-earlier same day MEDICATIONS: None ANESTHESIA/SEDATION: Moderate (conscious) sedation was employed during this procedure. A total of Versed 1 mg and Fentanyl 25 mcg was administered intravenously. Moderate Sedation Time: 62 minutes. The patient's level of consciousness and vital signs were monitored continuously by radiology nursing throughout the procedure under my direct supervision. FLUOROSCOPY TIME:  21 minutes (6,073 mGy) COMPLICATIONS: None immediate. PROCEDURE: Informed consent was obtained from the patient following explanation of the procedure, risks, benefits and alternatives. All questions were addressed. A time out was performed prior to the initiation of the procedure. Maximal barrier sterile technique utilized including caps, mask, sterile gowns, sterile gloves, large sterile drape, hand  hygiene, and Betadine prep. The right femoral head was marked fluoroscopically. Under sterile conditions and local anesthesia, the right common femoral artery access was performed with a micropuncture needle. Under direct ultrasound guidance, the right common femoral was accessed with a micropuncture kit. An ultrasound image was saved for documentation purposes. This allowed for placement of a 5-French vascular sheath. A limited arteriogram was performed through the side arm of the sheath confirming appropriate access within the right common femoral artery. Over a Bentson wire, a Mickelson catheter was advanced the caudal aspect of the thoracic aorta where was reformed, back bled and flushed. The Mickelson catheter was then utilized to select the superior mesenteric artery and a selective superior mesenteric arteriogram was performed. The Mickelson catheter was then utilized to select the inferior mesenteric artery and a selective inferior mesenteric arteriogram was performed. Next, with the use of a fathom 14 microwire, a STC Renegade microcatheter was advanced into the left colic artery and a selective left colic arteriogram was performed. Microcatheter was advanced to the level of the marginal artery and a selective marginal artery arteriogram was performed. Microcatheter was advanced beyond the origin of a tiny distal arcade branch supplying an ill-defined area of active contrast extravasation within the splenic flexure of the colon correlating with the findings on preceding CTA. Next, a short segment of the marginal artery with percutaneously coil embolized with 2 mm and 3 mm diameter interlock coils. The microcatheter was retracted to the more central aspect of the marginal artery and a post embolization marginal arteriogram was performed. Microcatheter was retracted to the level of the left colic artery and a post embolization left colic arteriogram was performed Next, with the use of the fathom 14 microwire,  the microcatheter was utilized to select the sigmoid artery and a sub selective sigmoid arteriogram was performed. The microcatheter was advanced to the opposite side of the embolized segment of the marginal artery and a sub selective marginal arteriogram was performed. Images were reviewed and the procedure was terminated. All wires, catheters and sheaths were removed from the patient. Hemostasis was achieved at the right groin access site with deployment of an ExoSeal closure device and manual compression. Dressings were applied. The patient tolerated the procedure well without immediate post procedural complication. FINDINGS: Selective superior mesenteric arteriogram is negative for significant arterial supply to the ill-defined area of contrast extravasation within the splenic flexure of the colon. Excreted contrast is noted within a focally dilated left superior pole calyx as was demonstrated on preceding abdominal CT. Selective inferior mesenteric arteriogram demonstrates predominant arterial supply to the splenic flexure of the colon via the left colic artery. Sub selective left colic arteriogram demonstrates an ill-defined area of contrast extravasation arising from a tiny arcade branch of the marginal artery. This finding was confirmed with sub selective marginal artery arteriogram. Ultimately, a short segment of the marginal artery was percutaneously coil embolized with overlapping interlock coils traversing the origin of the tiny distal arcade branch supplying the ill-defined area of active extravasation. Post embolization marginal artery and left colic arteriograms were negative for discrete area of residual contrast extravasation. Given robust arterial supply to the splenic flexure of the colon from the sigmoid artery, a selective sigmoid arteriogram was performed as was a sub selective marginal artery arteriogram supplying the opposite side of the embolized segment of the vessel however both were  negative for discrete area of collateral arterial supply to the ill-defined area of active extravasation within the splenic flexure of the colon. IMPRESSION: Technically successful percutaneous coil embolization of a short segment of the marginal artery supplying the origin of a tiny distal arcade which demonstrated active intraluminal contrast extravasation, compatible with the findings seen on preceding CTA. PLAN: - The patient is to remain flat for 4 hours with right leg straight. - The patient will continue to experience several additional bloody bowel movements and may continue to require additional resuscitation (as she was bleeding both before and during the procedure), however ultimately I am hopeful she will stabilize in the coming days. - If there remains clinical concern for persistent lower GI bleeding, would recommend coordinating with the interventional radiology service to perform a nuclear medicine tagged red blood cell study during daytime working hours as to expedite potential repeat angiography if the tagged study is positive. 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 Electronically Signed   By: Sandi Mariscal M.D.   On: 12/19/2020 16:01   IR Angiogram Follow Up Study  Result Date: 12/19/2020 INDICATION: Acute lower GI bleeding. Positive CTA with active extravasation at the level of the splenic flexure of the colon. Please perform mesenteric arteriogram and percutaneous embolization as indicated. EXAM: 1. ULTRASOUND GUIDANCE FOR ARTERIAL ACCESS 2. SELECTIVE SUPERIOR MESENTERIC ARTERIOGRAM 3. SELECTIVE INFERIOR MESENTERIC ARTERIOGRAM 4. SUB SELECTIVE LEFT COLIC ARTERIOGRAM 5. SUB SELECTIVE MARGINAL ARTERY ARTERIOGRAM AND PERCUTANEOUS COIL EMBOLIZATION 6. SUB SELECTIVE SIGMOIDAL ARTERIOGRAM COMPARISON:  CTA abdomen and pelvis-earlier same day MEDICATIONS: None  ANESTHESIA/SEDATION: Moderate (conscious) sedation was employed during this procedure. A total of Versed 1 mg and Fentanyl 25 mcg was administered intravenously. Moderate Sedation Time: 62 minutes. The patient's level of consciousness  and vital signs were monitored continuously by radiology nursing throughout the procedure under my direct supervision. FLUOROSCOPY TIME:  21 minutes (1,448 mGy) COMPLICATIONS: None immediate. PROCEDURE: Informed consent was obtained from the patient following explanation of the procedure, risks, benefits and alternatives. All questions were addressed. A time out was performed prior to the initiation of the procedure. Maximal barrier sterile technique utilized including caps, mask, sterile gowns, sterile gloves, large sterile drape, hand hygiene, and Betadine prep. The right femoral head was marked fluoroscopically. Under sterile conditions and local anesthesia, the right common femoral artery access was performed with a micropuncture needle. Under direct ultrasound guidance, the right common femoral was accessed with a micropuncture kit. An ultrasound image was saved for documentation purposes. This allowed for placement of a 5-French vascular sheath. A limited arteriogram was performed through the side arm of the sheath confirming appropriate access within the right common femoral artery. Over a Bentson wire, a Mickelson catheter was advanced the caudal aspect of the thoracic aorta where was reformed, back bled and flushed. The Mickelson catheter was then utilized to select the superior mesenteric artery and a selective superior mesenteric arteriogram was performed. The Mickelson catheter was then utilized to select the inferior mesenteric artery and a selective inferior mesenteric arteriogram was performed. Next, with the use of a fathom 14 microwire, a STC Renegade microcatheter was advanced into the left colic artery and a selective left colic arteriogram was performed.  Microcatheter was advanced to the level of the marginal artery and a selective marginal artery arteriogram was performed. Microcatheter was advanced beyond the origin of a tiny distal arcade branch supplying an ill-defined area of active contrast extravasation within the splenic flexure of the colon correlating with the findings on preceding CTA. Next, a short segment of the marginal artery with percutaneously coil embolized with 2 mm and 3 mm diameter interlock coils. The microcatheter was retracted to the more central aspect of the marginal artery and a post embolization marginal arteriogram was performed. Microcatheter was retracted to the level of the left colic artery and a post embolization left colic arteriogram was performed Next, with the use of the fathom 14 microwire, the microcatheter was utilized to select the sigmoid artery and a sub selective sigmoid arteriogram was performed. The microcatheter was advanced to the opposite side of the embolized segment of the marginal artery and a sub selective marginal arteriogram was performed. Images were reviewed and the procedure was terminated. All wires, catheters and sheaths were removed from the patient. Hemostasis was achieved at the right groin access site with deployment of an ExoSeal closure device and manual compression. Dressings were applied. The patient tolerated the procedure well without immediate post procedural complication. FINDINGS: Selective superior mesenteric arteriogram is negative for significant arterial supply to the ill-defined area of contrast extravasation within the splenic flexure of the colon. Excreted contrast is noted within a focally dilated left superior pole calyx as was demonstrated on preceding abdominal CT. Selective inferior mesenteric arteriogram demonstrates predominant arterial supply to the splenic flexure of the colon via the left colic artery. Sub selective left colic arteriogram demonstrates an ill-defined area of  contrast extravasation arising from a tiny arcade branch of the marginal artery. This finding was confirmed with sub selective marginal artery arteriogram. Ultimately, a short segment of the marginal artery was percutaneously coil embolized with overlapping interlock coils traversing the origin of the tiny distal arcade branch supplying the ill-defined area of active extravasation. Post embolization marginal artery and left colic arteriograms  were negative for discrete area of residual contrast extravasation. Given robust arterial supply to the splenic flexure of the colon from the sigmoid artery, a selective sigmoid arteriogram was performed as was a sub selective marginal artery arteriogram supplying the opposite side of the embolized segment of the vessel however both were negative for discrete area of collateral arterial supply to the ill-defined area of active extravasation within the splenic flexure of the colon. IMPRESSION: Technically successful percutaneous coil embolization of a short segment of the marginal artery supplying the origin of a tiny distal arcade which demonstrated active intraluminal contrast extravasation, compatible with the findings seen on preceding CTA. PLAN: - The patient is to remain flat for 4 hours with right leg straight. - The patient will continue to experience several additional bloody bowel movements and may continue to require additional resuscitation (as she was bleeding both before and during the procedure), however ultimately I am hopeful she will stabilize in the coming days. - If there remains clinical concern for persistent lower GI bleeding, would recommend coordinating with the interventional radiology service to perform a nuclear medicine tagged red blood cell study during daytime working hours as to expedite potential repeat angiography if the tagged study is positive. 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 Electronically Signed   By: Sandi Mariscal M.D.   On: 12/19/2020 16:01   IR US Guide Vasc Access Right  Result Date: 12/19/2020 INDICATION: Acute lower GI bleeding. Positive CTA with active extravasation at the level of the splenic flexure of the colon. Please perform mesenteric arteriogram and percutaneous embolization as indicated. EXAM: 1. ULTRASOUND GUIDANCE FOR ARTERIAL ACCESS 2. SELECTIVE SUPERIOR MESENTERIC ARTERIOGRAM 3. SELECTIVE INFERIOR MESENTERIC ARTERIOGRAM 4. SUB SELECTIVE LEFT COLIC ARTERIOGRAM 5. SUB SELECTIVE MARGINAL ARTERY ARTERIOGRAM AND PERCUTANEOUS COIL EMBOLIZATION 6. SUB SELECTIVE SIGMOIDAL ARTERIOGRAM COMPARISON:  CTA abdomen and pelvis-earlier same day MEDICATIONS: None ANESTHESIA/SEDATION: Moderate (conscious) sedation was employed during this procedure. A total of Versed 1 mg and Fentanyl 25 mcg was administered intravenously. Moderate Sedation Time: 62 minutes. The patient's level of consciousness and vital signs were monitored continuously by radiology nursing throughout the procedure under my direct supervision. FLUOROSCOPY TIME:  21 minutes (4,854 mGy) COMPLICATIONS: None immediate. PROCEDURE: Informed consent was obtained from the patient following explanation of the procedure, risks, benefits and alternatives. All questions were addressed. A time out was performed prior to the initiation of the procedure. Maximal barrier sterile technique utilized including caps, mask, sterile gowns, sterile gloves, large sterile drape, hand hygiene, and Betadine prep. The right femoral head was marked fluoroscopically. Under sterile conditions and local anesthesia, the right common femoral artery access was performed with a micropuncture needle. Under direct ultrasound guidance, the right common femoral was accessed with a micropuncture kit. An ultrasound image was saved for documentation purposes. This allowed for  placement of a 5-French vascular sheath. A limited arteriogram was performed through the side arm of the sheath confirming appropriate access within the right common femoral artery. Over a Bentson wire, a Mickelson catheter was advanced the caudal aspect of the thoracic aorta where was reformed, back bled and flushed. The Mickelson catheter was then utilized to select the superior mesenteric artery and a selective superior mesenteric arteriogram was performed. The Mickelson catheter was then utilized to select the inferior mesenteric artery and a selective inferior mesenteric arteriogram was performed. Next, with the use of a fathom 14  microwire, a STC Renegade microcatheter was advanced into the left colic artery and a selective left colic arteriogram was performed. Microcatheter was advanced to the level of the marginal artery and a selective marginal artery arteriogram was performed. Microcatheter was advanced beyond the origin of a tiny distal arcade branch supplying an ill-defined area of active contrast extravasation within the splenic flexure of the colon correlating with the findings on preceding CTA. Next, a short segment of the marginal artery with percutaneously coil embolized with 2 mm and 3 mm diameter interlock coils. The microcatheter was retracted to the more central aspect of the marginal artery and a post embolization marginal arteriogram was performed. Microcatheter was retracted to the level of the left colic artery and a post embolization left colic arteriogram was performed Next, with the use of the fathom 14 microwire, the microcatheter was utilized to select the sigmoid artery and a sub selective sigmoid arteriogram was performed. The microcatheter was advanced to the opposite side of the embolized segment of the marginal artery and a sub selective marginal arteriogram was performed. Images were reviewed and the procedure was terminated. All wires, catheters and sheaths were removed from the  patient. Hemostasis was achieved at the right groin access site with deployment of an ExoSeal closure device and manual compression. Dressings were applied. The patient tolerated the procedure well without immediate post procedural complication. FINDINGS: Selective superior mesenteric arteriogram is negative for significant arterial supply to the ill-defined area of contrast extravasation within the splenic flexure of the colon. Excreted contrast is noted within a focally dilated left superior pole calyx as was demonstrated on preceding abdominal CT. Selective inferior mesenteric arteriogram demonstrates predominant arterial supply to the splenic flexure of the colon via the left colic artery. Sub selective left colic arteriogram demonstrates an ill-defined area of contrast extravasation arising from a tiny arcade branch of the marginal artery. This finding was confirmed with sub selective marginal artery arteriogram. Ultimately, a short segment of the marginal artery was percutaneously coil embolized with overlapping interlock coils traversing the origin of the tiny distal arcade branch supplying the ill-defined area of active extravasation. Post embolization marginal artery and left colic arteriograms were negative for discrete area of residual contrast extravasation. Given robust arterial supply to the splenic flexure of the colon from the sigmoid artery, a selective sigmoid arteriogram was performed as was a sub selective marginal artery arteriogram supplying the opposite side of the embolized segment of the vessel however both were negative for discrete area of collateral arterial supply to the ill-defined area of active extravasation within the splenic flexure of the colon. IMPRESSION: Technically successful percutaneous coil embolization of a short segment of the marginal artery supplying the origin of a tiny distal arcade which demonstrated active intraluminal contrast extravasation, compatible with the  findings seen on preceding CTA. PLAN: - The patient is to remain flat for 4 hours with right leg straight. - The patient will continue to experience several additional bloody bowel movements and may continue to require additional resuscitation (as she was bleeding both before and during the procedure), however ultimately I am hopeful she will stabilize in the coming days. - If there remains clinical concern for persistent lower GI bleeding, would recommend coordinating with the interventional radiology service to perform a nuclear medicine tagged red blood cell study during daytime working hours as to expedite potential repeat angiography if the tagged study is positive. 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 Electronically Signed   By: Sandi Mariscal M.D.   On: 12/19/2020 16:01   IR EMBO ART  VEN HEMORR LYMPH EXTRAV  INC GUIDE ROADMAPPING  Result Date: 12/19/2020 INDICATION: Acute lower GI bleeding. Positive CTA with active extravasation at the level of the splenic flexure of the colon. Please perform mesenteric arteriogram and percutaneous embolization as indicated. EXAM: 1. ULTRASOUND GUIDANCE FOR ARTERIAL ACCESS 2. SELECTIVE SUPERIOR MESENTERIC ARTERIOGRAM 3. SELECTIVE INFERIOR MESENTERIC ARTERIOGRAM 4. SUB SELECTIVE LEFT COLIC ARTERIOGRAM 5. SUB SELECTIVE MARGINAL ARTERY ARTERIOGRAM AND PERCUTANEOUS COIL EMBOLIZATION 6. SUB SELECTIVE SIGMOIDAL ARTERIOGRAM COMPARISON:  CTA abdomen and pelvis-earlier same day MEDICATIONS: None ANESTHESIA/SEDATION: Moderate (conscious) sedation was employed during this procedure. A total of Versed 1 mg and Fentanyl 25 mcg was administered intravenously. Moderate Sedation Time: 62 minutes. The patient's level of consciousness and vital signs were monitored continuously by radiology nursing throughout the procedure under my direct supervision.  FLUOROSCOPY TIME:  21 minutes (5,465 mGy) COMPLICATIONS: None immediate. PROCEDURE: Informed consent was obtained from the patient following explanation of the procedure, risks, benefits and alternatives. All questions were addressed. A time out was performed prior to the initiation of the procedure. Maximal barrier sterile technique utilized including caps, mask, sterile gowns, sterile gloves, large sterile drape, hand hygiene, and Betadine prep. The right femoral head was marked fluoroscopically. Under sterile conditions and local anesthesia, the right common femoral artery access was performed with a micropuncture needle. Under direct ultrasound guidance, the right common femoral was accessed with a micropuncture kit. An ultrasound image was saved for documentation purposes. This allowed for placement of a 5-French vascular sheath. A limited arteriogram was performed through the side arm of the sheath confirming appropriate access within the right common femoral artery. Over a Bentson wire, a Mickelson catheter was advanced the caudal aspect of the thoracic aorta where was reformed, back bled and flushed. The Mickelson catheter was then utilized to select the superior mesenteric artery and a selective superior mesenteric arteriogram was performed. The Mickelson catheter was then utilized to select the inferior mesenteric artery and a selective inferior mesenteric arteriogram was performed. Next, with the use of a fathom 14 microwire, a STC Renegade microcatheter was advanced into the left colic artery and a selective left colic arteriogram was performed. Microcatheter was advanced to the level of the marginal artery and a selective marginal artery arteriogram was performed. Microcatheter was advanced beyond the origin of a tiny distal arcade branch supplying an ill-defined area of active contrast extravasation within the splenic flexure of the colon correlating with the findings on preceding CTA. Next, a short  segment of the marginal artery with percutaneously coil embolized with 2 mm and 3 mm diameter interlock coils. The microcatheter was retracted to the more central aspect of the marginal artery and a post embolization marginal arteriogram was performed. Microcatheter was retracted to the level of the left colic artery and a post embolization left colic arteriogram was performed Next, with the use of the fathom 14 microwire, the microcatheter was utilized to select the sigmoid artery and a sub selective sigmoid arteriogram was performed. The microcatheter was advanced to the opposite side of the embolized segment of the marginal artery and a sub selective marginal arteriogram was performed. Images were reviewed and the procedure was terminated. All wires, catheters and sheaths were removed from the patient. Hemostasis was achieved at the right groin access site with deployment of an ExoSeal closure device and manual compression.  Dressings were applied. The patient tolerated the procedure well without immediate post procedural complication. FINDINGS: Selective superior mesenteric arteriogram is negative for significant arterial supply to the ill-defined area of contrast extravasation within the splenic flexure of the colon. Excreted contrast is noted within a focally dilated left superior pole calyx as was demonstrated on preceding abdominal CT. Selective inferior mesenteric arteriogram demonstrates predominant arterial supply to the splenic flexure of the colon via the left colic artery. Sub selective left colic arteriogram demonstrates an ill-defined area of contrast extravasation arising from a tiny arcade branch of the marginal artery. This finding was confirmed with sub selective marginal artery arteriogram. Ultimately, a short segment of the marginal artery was percutaneously coil embolized with overlapping interlock coils traversing the origin of the tiny distal arcade branch supplying the ill-defined area of  active extravasation. Post embolization marginal artery and left colic arteriograms were negative for discrete area of residual contrast extravasation. Given robust arterial supply to the splenic flexure of the colon from the sigmoid artery, a selective sigmoid arteriogram was performed as was a sub selective marginal artery arteriogram supplying the opposite side of the embolized segment of the vessel however both were negative for discrete area of collateral arterial supply to the ill-defined area of active extravasation within the splenic flexure of the colon. IMPRESSION: Technically successful percutaneous coil embolization of a short segment of the marginal artery supplying the origin of a tiny distal arcade which demonstrated active intraluminal contrast extravasation, compatible with the findings seen on preceding CTA. PLAN: - The patient is to remain flat for 4 hours with right leg straight. - The patient will continue to experience several additional bloody bowel movements and may continue to require additional resuscitation (as she was bleeding both before and during the procedure), however ultimately I am hopeful she will stabilize in the coming days. - If there remains clinical concern for persistent lower GI bleeding, would recommend coordinating with the interventional radiology service to perform a nuclear medicine tagged red blood cell study during daytime working hours as to expedite potential repeat angiography if the tagged study is positive. 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 Electronically Signed   By: Sandi Mariscal M.D.   On: 12/19/2020 16:01   CT ANGIO GI BLEED  Result Date: 12/19/2020 CLINICAL DATA:  Diverticulosis, recurrent diverticular hemorrhage, continued hematochezia EXAM: CTA ABDOMEN AND PELVIS WITHOUT AND WITH CONTRAST TECHNIQUE: Multidetector  CT imaging of the abdomen and pelvis was performed using the standard protocol during bolus administration of intravenous contrast. Multiplanar reconstructed images and MIPs were obtained and reviewed to evaluate the vascular anatomy. CONTRAST:  19m OMNIPAQUE IOHEXOL 350 MG/ML SOLN COMPARISON:  CT pelvis, 08/17/2018 FINDINGS: VASCULAR Moderate calcific aortic atherosclerosis. Normal contour and caliber of the abdominal aorta. 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 Otherwise standard branching pattern of the abdominal aorta. Atherosclerosis at the branch vessel origins without significant stenosis. There is an intraluminal arterial contrast blush within the distal transverse colon, just proximal to the splenic flexure, with multiple diverticula in this vicinity (series 7, image 36, series 12, image 66). Review of the MIP images confirms the above findings. NON-VASCULAR Lower chest: No acute abnormality. Hepatobiliary: No focal liver abnormality is seen. No gallstones, gallbladder wall thickening, or biliary dilatation. Pancreas: Unremarkable.  No pancreatic ductal dilatation or surrounding inflammatory changes. Spleen: Normal in size without focal abnormality. Adrenals/Urinary Tract: Adrenal glands are unremarkable. Left-sided parapelvic cyst. Kidneys are otherwise normal, without renal calculi, solid lesion, or hydronephrosis. Bladder is unremarkable. Stomach/Bowel: Stomach is within normal limits. Appendix appears normal. No evidence of bowel wall thickening, distention, or inflammatory changes. Lymphatic: No significant vascular findings are present. No enlarged abdominal or pelvic lymph nodes. Reproductive: Uterus and bilateral adnexa are unremarkable. Other: No abdominal wall hernia or abnormality. No abdominopelvic ascites. Musculoskeletal: No acute or significant  osseous findings. IMPRESSION: 1. Intraluminal arterial contrast blush, localizing diverticular hemorrhage to the distal transverse colon just proximal to the splenic flexure. 2.  Pancolonic diverticulosis. 3.  Aortic atherosclerosis. These results will be called to the ordering clinician or representative by the Radiologist Assistant, and communication documented in the PACS or [employer]. Electronically Signed   By: Eddie Candle M.D.   On: 12/19/2020 10:43    Microbiology: Recent Results (from the past 240 hour(s))  SARS CORONAVIRUS 2 (TAT 6-24 HRS) Nasopharyngeal Nasopharyngeal Swab     Status: None   Collection Time: 12/18/20  5:47 PM   Specimen: Nasopharyngeal Swab  Result Value Ref Range Status   SARS Coronavirus 2 NEGATIVE NEGATIVE Final    Comment: (NOTE) SARS-CoV-2 target nucleic acids are NOT DETECTED.  The SARS-CoV-2 RNA is generally detectable in upper and lower respiratory specimens during the acute phase of infection. Negative results do not preclude SARS-CoV-2 infection, do not rule out co-infections with other pathogens, and should not be used as the sole basis for treatment or other patient management decisions. Negative results must be combined with clinical observations, patient history, and epidemiological information. The expected result is Negative.  Fact Sheet for Patients: SugarRoll.be  Fact Sheet for Healthcare Providers: https://www.woods-mathews.com/  This test is not yet approved or cleared by the [place] FDA and  has been authorized for detection and/or diagnosis of SARS-CoV-2 by FDA under an Emergency Use Authorization (EUA). This EUA will remain  in effect (meaning this test can be used) for the duration of the COVID-19 declaration under Se ction 564(b)(1) of the Act, 21 U.S.C. section 360bbb-3(b)(1), unless the authorization is terminated or revoked sooner.  Performed at [facility], [place] [address] [place], [place] [address]      Labs: Basic Metabolic Panel: Recent Labs  Lab 12/19/20 0144 12/20/20 0028 12/21/20 0141 12/22/20 0619 12/23/20 0450  NA 138 137 138 133* 129*  K 3.5 3.2* 3.2* 3.8 4.1  CL 105 107 107 104 93*  CO2 '26 22 24 25 26  ' GLUCOSE 111* 118* 108* 129* 119*  BUN 17 10 7* <5* <5*  CREATININE 0.53 0.49 0.50 0.41* 0.38*  CALCIUM 8.3* 7.7* 7.8* 7.7* 8.4*  MG  --  2.0 1.9 1.9 2.0   Liver Function Tests: Recent Labs  Lab 12/18/20 1439  AST 19  ALT 12  ALKPHOS 36*  BILITOT 0.9  PROT 5.4*  ALBUMIN 3.2*   No results for input(s): LIPASE, AMYLASE in the last 168 hours. No results for input(s): AMMONIA in the last 168 hours. CBC: Recent Labs  Lab 12/18/20 1439 12/19/20 0144 12/21/20 0817 12/21/20 1630 12/22/20 0619 12/22/20 1702 12/23/20 0450  WBC 6.5   < > 11.9* 10.7* 9.9 9.8 9.7  NEUTROABS 3.7  --   --   --   --   --  6.8  HGB 6.4*   < > 8.2* 7.9* 7.8* 8.6* 8.7*  HCT  21.1*   < > 25.0* 23.9* 23.8* 26.6* 27.0*  MCV 89.4   < > 88.0 87.2 89.1 90.5 90.6  PLT 173   < > 144* 144* 150 182 184   < > = values in this interval not displayed.   Cardiac Enzymes: No results for input(s): CKTOTAL, CKMB, CKMBINDEX, TROPONINI in the last 168 hours. BNP: BNP (last 3 results) No results for input(s): BNP in the last 8760 hours.  ProBNP (last 3 results) No results for input(s): PROBNP in the last 8760 hours.  CBG: No results for input(s): GLUCAP in the last 168 hours.     Signed:  Nita Sells MD   [facility] 12/23/2020, 12:48 PM

## 2020-12-24 DIAGNOSIS — Z7401 Bed confinement status: Secondary | ICD-10-CM | POA: Diagnosis not present

## 2020-12-24 DIAGNOSIS — R4182 Altered mental status, unspecified: Secondary | ICD-10-CM | POA: Diagnosis not present

## 2020-12-24 DIAGNOSIS — R5381 Other malaise: Secondary | ICD-10-CM | POA: Diagnosis not present

## 2020-12-24 DIAGNOSIS — R5383 Other fatigue: Secondary | ICD-10-CM | POA: Diagnosis not present

## 2020-12-24 LAB — CBC WITH DIFFERENTIAL/PLATELET
Abs Immature Granulocytes: 0.1 10*3/uL — ABNORMAL HIGH (ref 0.00–0.07)
Basophils Absolute: 0.1 10*3/uL (ref 0.0–0.1)
Basophils Relative: 1 %
Eosinophils Absolute: 0.3 10*3/uL (ref 0.0–0.5)
Eosinophils Relative: 3 %
HCT: 27.5 % — ABNORMAL LOW (ref 36.0–46.0)
Hemoglobin: 8.8 g/dL — ABNORMAL LOW (ref 12.0–15.0)
Immature Granulocytes: 1 %
Lymphocytes Relative: 15 %
Lymphs Abs: 1.5 10*3/uL (ref 0.7–4.0)
MCH: 28.8 pg (ref 26.0–34.0)
MCHC: 32 g/dL (ref 30.0–36.0)
MCV: 89.9 fL (ref 80.0–100.0)
Monocytes Absolute: 1.2 10*3/uL — ABNORMAL HIGH (ref 0.1–1.0)
Monocytes Relative: 12 %
Neutro Abs: 6.7 10*3/uL (ref 1.7–7.7)
Neutrophils Relative %: 68 %
Platelets: 203 10*3/uL (ref 150–400)
RBC: 3.06 MIL/uL — ABNORMAL LOW (ref 3.87–5.11)
RDW: 17.2 % — ABNORMAL HIGH (ref 11.5–15.5)
WBC: 10 10*3/uL (ref 4.0–10.5)
nRBC: 0.3 % — ABNORMAL HIGH (ref 0.0–0.2)

## 2020-12-24 LAB — RENAL FUNCTION PANEL
Albumin: 2.6 g/dL — ABNORMAL LOW (ref 3.5–5.0)
Anion gap: 7 (ref 5–15)
BUN: <5 mg/dL — ABNORMAL LOW (ref 8–23)
CO2: 28 mmol/L (ref 22–32)
Calcium: 8.7 mg/dL — ABNORMAL LOW (ref 8.9–10.3)
Chloride: 95 mmol/L — ABNORMAL LOW (ref 98–111)
Creatinine, Ser: 0.48 mg/dL (ref 0.44–1.00)
GFR, Estimated: >60 mL/min (ref >60)
Glucose, Bld: 114 mg/dL — ABNORMAL HIGH (ref 70–99)
Phosphorus: 3.8 mg/dL (ref 2.5–4.6)
Potassium: 4.5 mmol/L (ref 3.5–5.1)
Sodium: 130 mmol/L — ABNORMAL LOW (ref 135–145)

## 2020-12-24 LAB — MAGNESIUM: Magnesium: 2 mg/dL (ref 1.7–2.4)

## 2020-12-24 NOTE — Discharge Summary (Signed)
Physician Discharge Summary  [name] TXH:[id number] DOB: 22-Jul-1940 DOA: 12/18/2020  PCP: [name], MD  Admit date: 12/18/2020 Discharge date: 12/24/2020  Stabilized for d/c home with [facility] and [facility] oversight as declines to go to SNF No changes to d/c otherwise   Time spent: 37 minutes  Recommendations for Outpatient Follow-up:  Needs CBC Chem-12 9-week within 1 week Home health ordered on discharge for patient Continue home oxygen Needs iron studies in about 2 to 3 weeks in the outpatient setting  Discharge Diagnoses:  MAIN problem for hospitalization   Probable acute diverticular bleed  Please see below for itemized issues addressed in [place]- refer to other progress notes for clarity if needed  Discharge Condition: Fair  Diet recommendation: Heart healthy  Filed Weights   12/20/20 2325 12/21/20 0451 12/22/20 0656  Weight: 82.4 kg 82.7 kg 83.6 kg    History of present illness:  80 y.o. 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   Iron stores also low and she was ordered for iron transfusion.   6/21 CT angio GI scan = intraluminal arterial blush consistent with diverticular hemorrhage located in the distal transverse colon proximal to the splenic flexure. 6/21 radiology was consulted for embolization. 6/22 stabilized but then dark red blood from rectum hemoglobin dropped again--Transfused 1 more unit--GI made aware and reconsulted-discussion with them reveals probably no other IR intervention at this time   Patient seems to have stabilized over the past 48 hours and the plan is to discharge patient home with [facility]  patient-she will need PTAR to take her home  Hospital Course:  Lower GI bleed secondary to diverticular bleed most likely -hemoglobin 6.4 g/dL on admission -Given 2 units PRBC and iron infusion also ordered -Transfused urine 1 more unit PRBC 6/22 -Patient's bleeding appears to have stopped-both GI and IR have signed off -Repeat hemoglobin has remained stable and she is nearing discharge   Mild hyponatremia - Probably secondary to increased fluids versus solute in diet - Patient encouraged to restrict fluids a little bit we will recheck in a.m. -Will need outpatient follow-up labs  Ane hemoglobin has remained stable mia of acute blood loss secondary to diverticular bleed - Hemoglobin has now stabilized in the 8 range- - Should get iron studies in 1 to 2 weeks at PCP office  Hypertension - Propranolol held from admission -As an outpatient can reinitiate   Leukocytosis not otherwise specified - No fever and leukocytosis resolved on its own  COPD at baseline on 2 L of oxygen - No signs/symptoms of exacerbation  Anxiety/depression - Continue Xanax and Zoloft   Persistent hyp0kalemia - Saline lock D5 with K -Repeat labs 1 more time a.m. and likely can discharge home  Procedures:   Consultations: Dr. Cristina Gong of gastroenterology  Discharge Exam: Vitals:   12/23/20 2011 12/24/20 0532  BP: (!) 120/53 133/61  Pulse: 86 79  Resp: 17 19  Temp: 98.3 F (36.8 C) 98.8 F (37.1 C)  SpO2: 96% 94%    Subj on day of d/c   Sleepy but awakens easilt   General Exam on discharge  EOMI NCAT no focal deficit  Chest clear no added sound rales S1-S2 no murmur Abdomen  soft nt nd  trace lower extremity edema no rash    Discharge Instructions Discharge Instructions     Diet - low sodium heart healthy   Complete by: As directed    Increase activity slowly   Complete by: As directed    No wound care   Complete by: As directed       Allergies as of 12/24/2020        Reactions   Morphine And Related Itching   Abaloparatide Other (See Comments)   Aspirin Other (See Comments)   Duloxetine Hcl Other (See Comments)   Cefadroxil Hives   Patient can take amoxicillin and cipro        Medication List     STOP taking these medications    cholecalciferol 1000 units tablet Commonly known as: VITAMIN D   diclofenac sodium 1 % Gel Commonly known as: VOLTAREN   ICaps Areds 2 Caps   penicillin v potassium 500 MG tablet Commonly known as: VEETID   propranolol 20 MG tablet Commonly known as: INDERAL       TAKE these medications    acetaminophen 500 MG tablet Commonly known as: TYLENOL Take 1,000 mg by mouth daily as needed for headache.   albuterol 108 (90 Base) MCG/ACT inhaler Commonly known as: VENTOLIN HFA Inhale 2 puffs into the lungs daily as needed. For seasonal allergies . Use 2 puffs 3 times daily x 4 days then back to home regimen. What changed:  when to take this additional instructions   ALPRAZolam 0.25 MG tablet Commonly known as: XANAX Take 1 tablet (0.25 mg total) by mouth daily as needed for anxiety.   dorzolamide-timolol 22.3-6.8 MG/ML ophthalmic solution Commonly known as: COSOPT Place 1 drop into both eyes 2 (two) times daily.   DULoxetine 20 MG capsule Commonly known as: CYMBALTA Take 1 capsule (20 mg total) by mouth daily. What changed:  medication strength how much to take how to take this when to take this   latanoprost 0.005 % ophthalmic solution Commonly known as: XALATAN Place 1 drop into both eyes at bedtime.   loratadine 10 MG tablet Commonly known as: CLARITIN Take 1 tablet (10 mg total) by mouth daily.   multivitamin with minerals Tabs tablet Take 1 tablet by mouth daily.   omeprazole 20 MG capsule Commonly known as: PRILOSEC Take 20 mg by mouth every evening.   OXYGEN 2lpm with sleep only  [employer]   polyethylene glycol 17 g packet Commonly known as: MIRALAX / GLYCOLAX Take 17 g by  mouth daily.   rOPINIRole 0.5 MG tablet Commonly known as: REQUIP Take 1 tablet (0.5 mg total) by mouth at bedtime.   sertraline 100 MG tablet Commonly known as: ZOLOFT Take 1 tablet (100 mg total) by mouth daily. What changed:  how much to take when to take this   simvastatin 20 MG tablet Commonly known as: ZOCOR Take 20 mg by mouth daily at 6 PM.       Allergies  Allergen Reactions   Morphine And Related Itching   Abaloparatide Other (See Comments)   Aspirin Other (See Comments)   Duloxetine Hcl Other (See Comments)   Cefadroxil Hives    Patient can take amoxicillin and cipro    Follow-up Information     [facility] [place] Follow up.   Specialty: Home Health Services Why: [facility]. For home health services. They will contact you in 1-2 days to schedule a follow up appointment Contact information: [address] [place] [address] [phone]                  The results of significant diagnostics from this hospitalization (including imaging, microbiology, ancillary and laboratory) are listed below for reference.    Significant Diagnostic Studies: IR Angiogram Visceral Selective  Result Date: 12/19/2020 INDICATION: Acute lower GI bleeding. Positive CTA with active extravasation at the level of the splenic flexure of the colon. Please perform mesenteric arteriogram and percutaneous embolization as indicated. EXAM: 1. ULTRASOUND GUIDANCE FOR ARTERIAL ACCESS 2. SELECTIVE SUPERIOR MESENTERIC ARTERIOGRAM 3. SELECTIVE INFERIOR MESENTERIC ARTERIOGRAM 4. SUB SELECTIVE LEFT COLIC ARTERIOGRAM 5. SUB SELECTIVE MARGINAL ARTERY ARTERIOGRAM AND PERCUTANEOUS COIL EMBOLIZATION 6. SUB SELECTIVE SIGMOIDAL ARTERIOGRAM COMPARISON:  CTA abdomen and pelvis-earlier same day MEDICATIONS: None ANESTHESIA/SEDATION: Moderate (conscious) sedation was employed during this procedure. A total of Versed 1 mg and Fentanyl 25 mcg was administered intravenously.  Moderate Sedation Time: 62 minutes. The patient's level of consciousness and vital signs were monitored continuously by radiology nursing throughout the procedure under my direct supervision. FLUOROSCOPY TIME:  21 minutes (0,355 mGy) COMPLICATIONS: None immediate. PROCEDURE: Informed consent was obtained from the patient following explanation of the procedure, risks, benefits and alternatives. All questions were addressed. A time out was performed prior to the initiation of the procedure. Maximal barrier sterile technique utilized including caps, mask, sterile gowns, sterile gloves, large sterile drape, hand hygiene, and Betadine prep. The right femoral head was marked fluoroscopically. Under sterile conditions and local anesthesia, the right common femoral artery access was performed with a micropuncture needle. Under direct ultrasound guidance, the right common femoral was accessed with a micropuncture kit. An ultrasound image was saved for documentation purposes. This allowed for placement of a 5-French vascular sheath. A limited arteriogram was performed through the side arm of the sheath confirming appropriate access within the right common femoral artery. Over a Bentson wire, a Mickelson catheter was advanced the caudal aspect of the thoracic aorta where was reformed, back bled and flushed. The Mickelson catheter was then utilized to select the superior mesenteric artery and a selective superior mesenteric arteriogram was performed. The Mickelson catheter was then utilized to select the inferior mesenteric artery and a selective inferior mesenteric arteriogram was performed. Next, with the use of a fathom 14 microwire, a STC Renegade microcatheter was advanced into the left colic artery and a selective left colic arteriogram was performed. Microcatheter was advanced to the level of the marginal artery and a selective marginal artery arteriogram was performed. Microcatheter was advanced beyond the origin of a  tiny distal arcade branch supplying an ill-defined area of active contrast extravasation within the splenic flexure of the colon correlating with the findings on preceding CTA. Next, a short segment of the marginal artery with percutaneously coil embolized with 2 mm and 3 mm diameter interlock coils. The microcatheter was retracted to the more central aspect of the marginal artery and a post embolization marginal arteriogram was performed. Microcatheter was retracted to the level of the left colic artery and a post embolization left colic arteriogram was performed Next, with the use of the fathom 14 microwire, the microcatheter was utilized to select the sigmoid artery and a sub selective sigmoid arteriogram was performed. The microcatheter was advanced to the opposite side of the embolized segment of the marginal artery and a sub selective marginal arteriogram was performed. Images were reviewed and the procedure was terminated. All wires, catheters and sheaths were removed from the patient. Hemostasis was achieved  at the right groin access site with deployment of an ExoSeal closure device and manual compression. Dressings were applied. The patient tolerated the procedure well without immediate post procedural complication. FINDINGS: Selective superior mesenteric arteriogram is negative for significant arterial supply to the ill-defined area of contrast extravasation within the splenic flexure of the colon. Excreted contrast is noted within a focally dilated left superior pole calyx as was demonstrated on preceding abdominal CT. Selective inferior mesenteric arteriogram demonstrates predominant arterial supply to the splenic flexure of the colon via the left colic artery. Sub selective left colic arteriogram demonstrates an ill-defined area of contrast extravasation arising from a tiny arcade branch of the marginal artery. This finding was confirmed with sub selective marginal artery arteriogram. Ultimately, a  short segment of the marginal artery was percutaneously coil embolized with overlapping interlock coils traversing the origin of the tiny distal arcade branch supplying the ill-defined area of active extravasation. Post embolization marginal artery and left colic arteriograms were negative for discrete area of residual contrast extravasation. Given robust arterial supply to the splenic flexure of the colon from the sigmoid artery, a selective sigmoid arteriogram was performed as was a sub selective marginal artery arteriogram supplying the opposite side of the embolized segment of the vessel however both were negative for discrete area of collateral arterial supply to the ill-defined area of active extravasation within the splenic flexure of the colon. IMPRESSION: Technically successful percutaneous coil embolization of a short segment of the marginal artery supplying the origin of a tiny distal arcade which demonstrated active intraluminal contrast extravasation, compatible with the findings seen on preceding CTA. PLAN: - The patient is to remain flat for 4 hours with right leg straight. - The patient will continue to experience several additional bloody bowel movements and may continue to require additional resuscitation (as she was bleeding both before and during the procedure), however ultimately I am hopeful she will stabilize in the coming days. - If there remains clinical concern for persistent lower GI bleeding, would recommend coordinating with the interventional radiology service to perform a nuclear medicine tagged red blood cell study during daytime working hours as to expedite potential repeat angiography if the tagged study is positive. 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 Electronically Signed   By: Sandi Mariscal M.D.   On: 12/19/2020 16:01   IR Angiogram  Selective Each Additional Vessel  Result Date: 12/19/2020 INDICATION: Acute lower GI bleeding. Positive CTA with active extravasation at the level of the splenic flexure of the colon. Please perform mesenteric arteriogram and percutaneous embolization as indicated. EXAM: 1. ULTRASOUND GUIDANCE FOR ARTERIAL ACCESS 2. SELECTIVE SUPERIOR MESENTERIC ARTERIOGRAM 3. SELECTIVE INFERIOR MESENTERIC ARTERIOGRAM 4. SUB SELECTIVE LEFT COLIC ARTERIOGRAM 5. SUB SELECTIVE MARGINAL ARTERY ARTERIOGRAM AND PERCUTANEOUS COIL EMBOLIZATION 6. SUB SELECTIVE SIGMOIDAL ARTERIOGRAM COMPARISON:  CTA abdomen and pelvis-earlier same day MEDICATIONS: None ANESTHESIA/SEDATION: Moderate (conscious) sedation was employed during this procedure. A total of Versed 1 mg and Fentanyl 25 mcg was administered intravenously. Moderate Sedation Time: 62 minutes. The patient's level of consciousness and vital signs were monitored continuously by radiology nursing throughout the procedure under my direct supervision. FLUOROSCOPY TIME:  21 minutes (4,580 mGy) COMPLICATIONS: None immediate. PROCEDURE: Informed consent was obtained from the patient following explanation of the procedure, risks, benefits and alternatives. All questions were addressed. A time out was performed prior to the initiation of the procedure. Maximal barrier  sterile technique utilized including caps, mask, sterile gowns, sterile gloves, large sterile drape, hand hygiene, and Betadine prep. The right femoral head was marked fluoroscopically. Under sterile conditions and local anesthesia, the right common femoral artery access was performed with a micropuncture needle. Under direct ultrasound guidance, the right common femoral was accessed with a micropuncture kit. An ultrasound image was saved for documentation purposes. This allowed for placement of a 5-French vascular sheath. A limited arteriogram was performed through the side arm of the sheath confirming appropriate access within the  right common femoral artery. Over a Bentson wire, a Mickelson catheter was advanced the caudal aspect of the thoracic aorta where was reformed, back bled and flushed. The Mickelson catheter was then utilized to select the superior mesenteric artery and a selective superior mesenteric arteriogram was performed. The Mickelson catheter was then utilized to select the inferior mesenteric artery and a selective inferior mesenteric arteriogram was performed. Next, with the use of a fathom 14 microwire, a STC Renegade microcatheter was advanced into the left colic artery and a selective left colic arteriogram was performed. Microcatheter was advanced to the level of the marginal artery and a selective marginal artery arteriogram was performed. Microcatheter was advanced beyond the origin of a tiny distal arcade branch supplying an ill-defined area of active contrast extravasation within the splenic flexure of the colon correlating with the findings on preceding CTA. Next, a short segment of the marginal artery with percutaneously coil embolized with 2 mm and 3 mm diameter interlock coils. The microcatheter was retracted to the more central aspect of the marginal artery and a post embolization marginal arteriogram was performed. Microcatheter was retracted to the level of the left colic artery and a post embolization left colic arteriogram was performed Next, with the use of the fathom 14 microwire, the microcatheter was utilized to select the sigmoid artery and a sub selective sigmoid arteriogram was performed. The microcatheter was advanced to the opposite side of the embolized segment of the marginal artery and a sub selective marginal arteriogram was performed. Images were reviewed and the procedure was terminated. All wires, catheters and sheaths were removed from the patient. Hemostasis was achieved at the right groin access site with deployment of an ExoSeal closure device and manual compression. Dressings were  applied. The patient tolerated the procedure well without immediate post procedural complication. FINDINGS: Selective superior mesenteric arteriogram is negative for significant arterial supply to the ill-defined area of contrast extravasation within the splenic flexure of the colon. Excreted contrast is noted within a focally dilated left superior pole calyx as was demonstrated on preceding abdominal CT. Selective inferior mesenteric arteriogram demonstrates predominant arterial supply to the splenic flexure of the colon via the left colic artery. Sub selective left colic arteriogram demonstrates an ill-defined area of contrast extravasation arising from a tiny arcade branch of the marginal artery. This finding was confirmed with sub selective marginal artery arteriogram. Ultimately, a short segment of the marginal artery was percutaneously coil embolized with overlapping interlock coils traversing the origin of the tiny distal arcade branch supplying the ill-defined area of active extravasation. Post embolization marginal artery and left colic arteriograms were negative for discrete area of residual contrast extravasation. Given robust arterial supply to the splenic flexure of the colon from the sigmoid artery, a selective sigmoid arteriogram was performed as was a sub selective marginal artery arteriogram supplying the opposite side of the embolized segment of the vessel however both were negative for discrete area of collateral arterial supply to  the ill-defined area of active extravasation within the splenic flexure of the colon. IMPRESSION: Technically successful percutaneous coil embolization of a short segment of the marginal artery supplying the origin of a tiny distal arcade which demonstrated active intraluminal contrast extravasation, compatible with the findings seen on preceding CTA. PLAN: - The patient is to remain flat for 4 hours with right leg straight. - The patient will continue to experience  several additional bloody bowel movements and may continue to require additional resuscitation (as she was bleeding both before and during the procedure), however ultimately I am hopeful she will stabilize in the coming days. - If there remains clinical concern for persistent lower GI bleeding, would recommend coordinating with the interventional radiology service to perform a nuclear medicine tagged red blood cell study during daytime working hours as to expedite potential repeat angiography if the tagged study is positive. 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 Electronically Signed   By: Sandi Mariscal M.D.   On: 12/19/2020 16:01   IR Angiogram Selective Each Additional Vessel  Result Date: 12/19/2020 INDICATION: Acute lower GI bleeding. Positive CTA with active extravasation at the level of the splenic flexure of the colon. Please perform mesenteric arteriogram and percutaneous embolization as indicated. EXAM: 1. ULTRASOUND GUIDANCE FOR ARTERIAL ACCESS 2. SELECTIVE SUPERIOR MESENTERIC ARTERIOGRAM 3. SELECTIVE INFERIOR MESENTERIC ARTERIOGRAM 4. SUB SELECTIVE LEFT COLIC ARTERIOGRAM 5. SUB SELECTIVE MARGINAL ARTERY ARTERIOGRAM AND PERCUTANEOUS COIL EMBOLIZATION 6. SUB SELECTIVE SIGMOIDAL ARTERIOGRAM COMPARISON:  CTA abdomen and pelvis-earlier same day MEDICATIONS: None ANESTHESIA/SEDATION: Moderate (conscious) sedation was employed during this procedure. A total of Versed 1 mg and Fentanyl 25 mcg was administered intravenously. Moderate Sedation Time: 62 minutes. The patient's level of consciousness and vital signs were monitored continuously by radiology nursing throughout the procedure under my direct supervision. FLUOROSCOPY TIME:  21 minutes (3,976 mGy) COMPLICATIONS: None immediate. PROCEDURE: Informed consent was obtained from the patient following explanation of the  procedure, risks, benefits and alternatives. All questions were addressed. A time out was performed prior to the initiation of the procedure. Maximal barrier sterile technique utilized including caps, mask, sterile gowns, sterile gloves, large sterile drape, hand hygiene, and Betadine prep. The right femoral head was marked fluoroscopically. Under sterile conditions and local anesthesia, the right common femoral artery access was performed with a micropuncture needle. Under direct ultrasound guidance, the right common femoral was accessed with a micropuncture kit. An ultrasound image was saved for documentation purposes. This allowed for placement of a 5-French vascular sheath. A limited arteriogram was performed through the side arm of the sheath confirming appropriate access within the right common femoral artery. Over a Bentson wire, a Mickelson catheter was advanced the caudal aspect of the thoracic aorta where was reformed, back bled and flushed. The Mickelson catheter was then utilized to select the superior mesenteric artery and a selective superior mesenteric arteriogram was performed. The Mickelson catheter was then utilized to select the inferior mesenteric artery and a selective inferior mesenteric arteriogram was performed. Next, with the use of a fathom 14 microwire, a STC Renegade microcatheter was advanced into the left colic artery and a selective left colic arteriogram was performed. Microcatheter was advanced to the level of the marginal artery and a selective marginal artery arteriogram was performed. Microcatheter was advanced beyond the origin of a tiny distal arcade branch supplying an ill-defined area of active contrast extravasation within the splenic  flexure of the colon correlating with the findings on preceding CTA. Next, a short segment of the marginal artery with percutaneously coil embolized with 2 mm and 3 mm diameter interlock coils. The microcatheter was retracted to the more central  aspect of the marginal artery and a post embolization marginal arteriogram was performed. Microcatheter was retracted to the level of the left colic artery and a post embolization left colic arteriogram was performed Next, with the use of the fathom 14 microwire, the microcatheter was utilized to select the sigmoid artery and a sub selective sigmoid arteriogram was performed. The microcatheter was advanced to the opposite side of the embolized segment of the marginal artery and a sub selective marginal arteriogram was performed. Images were reviewed and the procedure was terminated. All wires, catheters and sheaths were removed from the patient. Hemostasis was achieved at the right groin access site with deployment of an ExoSeal closure device and manual compression. Dressings were applied. The patient tolerated the procedure well without immediate post procedural complication. FINDINGS: Selective superior mesenteric arteriogram is negative for significant arterial supply to the ill-defined area of contrast extravasation within the splenic flexure of the colon. Excreted contrast is noted within a focally dilated left superior pole calyx as was demonstrated on preceding abdominal CT. Selective inferior mesenteric arteriogram demonstrates predominant arterial supply to the splenic flexure of the colon via the left colic artery. Sub selective left colic arteriogram demonstrates an ill-defined area of contrast extravasation arising from a tiny arcade branch of the marginal artery. This finding was confirmed with sub selective marginal artery arteriogram. Ultimately, a short segment of the marginal artery was percutaneously coil embolized with overlapping interlock coils traversing the origin of the tiny distal arcade branch supplying the ill-defined area of active extravasation. Post embolization marginal artery and left colic arteriograms were negative for discrete area of residual contrast extravasation. Given robust  arterial supply to the splenic flexure of the colon from the sigmoid artery, a selective sigmoid arteriogram was performed as was a sub selective marginal artery arteriogram supplying the opposite side of the embolized segment of the vessel however both were negative for discrete area of collateral arterial supply to the ill-defined area of active extravasation within the splenic flexure of the colon. IMPRESSION: Technically successful percutaneous coil embolization of a short segment of the marginal artery supplying the origin of a tiny distal arcade which demonstrated active intraluminal contrast extravasation, compatible with the findings seen on preceding CTA. PLAN: - The patient is to remain flat for 4 hours with right leg straight. - The patient will continue to experience several additional bloody bowel movements and may continue to require additional resuscitation (as she was bleeding both before and during the procedure), however ultimately I am hopeful she will stabilize in the coming days. - If there remains clinical concern for persistent lower GI bleeding, would recommend coordinating with the interventional radiology service to perform a nuclear medicine tagged red blood cell study during daytime working hours as to expedite potential repeat angiography if the tagged study is positive. 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 Electronically Signed   By: Sandi Mariscal M.D.   On: 12/19/2020 16:01   IR Angiogram Follow Up Study  Result Date: 12/19/2020 INDICATION: Acute lower GI bleeding. Positive CTA with active extravasation at the level of the splenic flexure of the colon. Please perform mesenteric arteriogram and percutaneous embolization  as indicated. EXAM: 1. ULTRASOUND GUIDANCE FOR ARTERIAL ACCESS 2. SELECTIVE SUPERIOR MESENTERIC ARTERIOGRAM 3. SELECTIVE INFERIOR  MESENTERIC ARTERIOGRAM 4. SUB SELECTIVE LEFT COLIC ARTERIOGRAM 5. SUB SELECTIVE MARGINAL ARTERY ARTERIOGRAM AND PERCUTANEOUS COIL EMBOLIZATION 6. SUB SELECTIVE SIGMOIDAL ARTERIOGRAM COMPARISON:  CTA abdomen and pelvis-earlier same day MEDICATIONS: None ANESTHESIA/SEDATION: Moderate (conscious) sedation was employed during this procedure. A total of Versed 1 mg and Fentanyl 25 mcg was administered intravenously. Moderate Sedation Time: 62 minutes. The patient's level of consciousness and vital signs were monitored continuously by radiology nursing throughout the procedure under my direct supervision. FLUOROSCOPY TIME:  21 minutes (7,353 mGy) COMPLICATIONS: None immediate. PROCEDURE: Informed consent was obtained from the patient following explanation of the procedure, risks, benefits and alternatives. All questions were addressed. A time out was performed prior to the initiation of the procedure. Maximal barrier sterile technique utilized including caps, mask, sterile gowns, sterile gloves, large sterile drape, hand hygiene, and Betadine prep. The right femoral head was marked fluoroscopically. Under sterile conditions and local anesthesia, the right common femoral artery access was performed with a micropuncture needle. Under direct ultrasound guidance, the right common femoral was accessed with a micropuncture kit. An ultrasound image was saved for documentation purposes. This allowed for placement of a 5-French vascular sheath. A limited arteriogram was performed through the side arm of the sheath confirming appropriate access within the right common femoral artery. Over a Bentson wire, a Mickelson catheter was advanced the caudal aspect of the thoracic aorta where was reformed, back bled and flushed. The Mickelson catheter was then utilized to select the superior mesenteric artery and a selective superior mesenteric arteriogram was performed. The Mickelson catheter was then utilized to select the inferior  mesenteric artery and a selective inferior mesenteric arteriogram was performed. Next, with the use of a fathom 14 microwire, a STC Renegade microcatheter was advanced into the left colic artery and a selective left colic arteriogram was performed. Microcatheter was advanced to the level of the marginal artery and a selective marginal artery arteriogram was performed. Microcatheter was advanced beyond the origin of a tiny distal arcade branch supplying an ill-defined area of active contrast extravasation within the splenic flexure of the colon correlating with the findings on preceding CTA. Next, a short segment of the marginal artery with percutaneously coil embolized with 2 mm and 3 mm diameter interlock coils. The microcatheter was retracted to the more central aspect of the marginal artery and a post embolization marginal arteriogram was performed. Microcatheter was retracted to the level of the left colic artery and a post embolization left colic arteriogram was performed Next, with the use of the fathom 14 microwire, the microcatheter was utilized to select the sigmoid artery and a sub selective sigmoid arteriogram was performed. The microcatheter was advanced to the opposite side of the embolized segment of the marginal artery and a sub selective marginal arteriogram was performed. Images were reviewed and the procedure was terminated. All wires, catheters and sheaths were removed from the patient. Hemostasis was achieved at the right groin access site with deployment of an ExoSeal closure device and manual compression. Dressings were applied. The patient tolerated the procedure well without immediate post procedural complication. FINDINGS: Selective superior mesenteric arteriogram is negative for significant arterial supply to the ill-defined area of contrast extravasation within the splenic flexure of the colon. Excreted contrast is noted within a focally dilated left superior pole calyx as was demonstrated  on preceding abdominal CT. Selective inferior mesenteric arteriogram demonstrates predominant arterial supply to  the splenic flexure of the colon via the left colic artery. Sub selective left colic arteriogram demonstrates an ill-defined area of contrast extravasation arising from a tiny arcade branch of the marginal artery. This finding was confirmed with sub selective marginal artery arteriogram. Ultimately, a short segment of the marginal artery was percutaneously coil embolized with overlapping interlock coils traversing the origin of the tiny distal arcade branch supplying the ill-defined area of active extravasation. Post embolization marginal artery and left colic arteriograms were negative for discrete area of residual contrast extravasation. Given robust arterial supply to the splenic flexure of the colon from the sigmoid artery, a selective sigmoid arteriogram was performed as was a sub selective marginal artery arteriogram supplying the opposite side of the embolized segment of the vessel however both were negative for discrete area of collateral arterial supply to the ill-defined area of active extravasation within the splenic flexure of the colon. IMPRESSION: Technically successful percutaneous coil embolization of a short segment of the marginal artery supplying the origin of a tiny distal arcade which demonstrated active intraluminal contrast extravasation, compatible with the findings seen on preceding CTA. PLAN: - The patient is to remain flat for 4 hours with right leg straight. - The patient will continue to experience several additional bloody bowel movements and may continue to require additional resuscitation (as she was bleeding both before and during the procedure), however ultimately I am hopeful she will stabilize in the coming days. - If there remains clinical concern for persistent lower GI bleeding, would recommend coordinating with the interventional radiology service to perform a  nuclear medicine tagged red blood cell study during daytime working hours as to expedite potential repeat angiography if the tagged study is positive. 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 Electronically Signed   By: Sandi Mariscal M.D.   On: 12/19/2020 16:01   IR US Guide Vasc Access Right  Result Date: 12/19/2020 INDICATION: Acute lower GI bleeding. Positive CTA with active extravasation at the level of the splenic flexure of the colon. Please perform mesenteric arteriogram and percutaneous embolization as indicated. EXAM: 1. ULTRASOUND GUIDANCE FOR ARTERIAL ACCESS 2. SELECTIVE SUPERIOR MESENTERIC ARTERIOGRAM 3. SELECTIVE INFERIOR MESENTERIC ARTERIOGRAM 4. SUB SELECTIVE LEFT COLIC ARTERIOGRAM 5. SUB SELECTIVE MARGINAL ARTERY ARTERIOGRAM AND PERCUTANEOUS COIL EMBOLIZATION 6. SUB SELECTIVE SIGMOIDAL ARTERIOGRAM COMPARISON:  CTA abdomen and pelvis-earlier same day MEDICATIONS: None ANESTHESIA/SEDATION: Moderate (conscious) sedation was employed during this procedure. A total of Versed 1 mg and Fentanyl 25 mcg was administered intravenously. Moderate Sedation Time: 62 minutes. The patient's level of consciousness and vital signs were monitored continuously by radiology nursing throughout the procedure under my direct supervision. FLUOROSCOPY TIME:  21 minutes (9,774 mGy) COMPLICATIONS: None immediate. PROCEDURE: Informed consent was obtained from the patient following explanation of the procedure, risks, benefits and alternatives. All questions were addressed. A time out was performed prior to the initiation of the procedure. Maximal barrier sterile technique utilized including caps, mask, sterile gowns, sterile gloves, large sterile drape, hand hygiene, and Betadine prep. The right femoral head was marked fluoroscopically. Under sterile conditions and local anesthesia, the right  common femoral artery access was performed with a micropuncture needle. Under direct ultrasound guidance, the right common femoral was accessed with a micropuncture kit. An ultrasound image was saved for documentation purposes. This allowed for placement of a 5-French vascular sheath. A limited arteriogram was performed through the side arm  of the sheath confirming appropriate access within the right common femoral artery. Over a Bentson wire, a Mickelson catheter was advanced the caudal aspect of the thoracic aorta where was reformed, back bled and flushed. The Mickelson catheter was then utilized to select the superior mesenteric artery and a selective superior mesenteric arteriogram was performed. The Mickelson catheter was then utilized to select the inferior mesenteric artery and a selective inferior mesenteric arteriogram was performed. Next, with the use of a fathom 14 microwire, a STC Renegade microcatheter was advanced into the left colic artery and a selective left colic arteriogram was performed. Microcatheter was advanced to the level of the marginal artery and a selective marginal artery arteriogram was performed. Microcatheter was advanced beyond the origin of a tiny distal arcade branch supplying an ill-defined area of active contrast extravasation within the splenic flexure of the colon correlating with the findings on preceding CTA. Next, a short segment of the marginal artery with percutaneously coil embolized with 2 mm and 3 mm diameter interlock coils. The microcatheter was retracted to the more central aspect of the marginal artery and a post embolization marginal arteriogram was performed. Microcatheter was retracted to the level of the left colic artery and a post embolization left colic arteriogram was performed Next, with the use of the fathom 14 microwire, the microcatheter was utilized to select the sigmoid artery and a sub selective sigmoid arteriogram was performed. The microcatheter was  advanced to the opposite side of the embolized segment of the marginal artery and a sub selective marginal arteriogram was performed. Images were reviewed and the procedure was terminated. All wires, catheters and sheaths were removed from the patient. Hemostasis was achieved at the right groin access site with deployment of an ExoSeal closure device and manual compression. Dressings were applied. The patient tolerated the procedure well without immediate post procedural complication. FINDINGS: Selective superior mesenteric arteriogram is negative for significant arterial supply to the ill-defined area of contrast extravasation within the splenic flexure of the colon. Excreted contrast is noted within a focally dilated left superior pole calyx as was demonstrated on preceding abdominal CT. Selective inferior mesenteric arteriogram demonstrates predominant arterial supply to the splenic flexure of the colon via the left colic artery. Sub selective left colic arteriogram demonstrates an ill-defined area of contrast extravasation arising from a tiny arcade branch of the marginal artery. This finding was confirmed with sub selective marginal artery arteriogram. Ultimately, a short segment of the marginal artery was percutaneously coil embolized with overlapping interlock coils traversing the origin of the tiny distal arcade branch supplying the ill-defined area of active extravasation. Post embolization marginal artery and left colic arteriograms were negative for discrete area of residual contrast extravasation. Given robust arterial supply to the splenic flexure of the colon from the sigmoid artery, a selective sigmoid arteriogram was performed as was a sub selective marginal artery arteriogram supplying the opposite side of the embolized segment of the vessel however both were negative for discrete area of collateral arterial supply to the ill-defined area of active extravasation within the splenic flexure of the  colon. IMPRESSION: Technically successful percutaneous coil embolization of a short segment of the marginal artery supplying the origin of a tiny distal arcade which demonstrated active intraluminal contrast extravasation, compatible with the findings seen on preceding CTA. PLAN: - The patient is to remain flat for 4 hours with right leg straight. - The patient will continue to experience several additional bloody bowel movements and may continue to require additional resuscitation (  as she was bleeding both before and during the procedure), however ultimately I am hopeful she will stabilize in the coming days. - If there remains clinical concern for persistent lower GI bleeding, would recommend coordinating with the interventional radiology service to perform a nuclear medicine tagged red blood cell study during daytime working hours as to expedite potential repeat angiography if the tagged study is positive. 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 Electronically Signed   By: Sandi Mariscal M.D.   On: 12/19/2020 16:01   IR EMBO ART  VEN HEMORR LYMPH EXTRAV  INC GUIDE ROADMAPPING  Result Date: 12/19/2020 INDICATION: Acute lower GI bleeding. Positive CTA with active extravasation at the level of the splenic flexure of the colon. Please perform mesenteric arteriogram and percutaneous embolization as indicated. EXAM: 1. ULTRASOUND GUIDANCE FOR ARTERIAL ACCESS 2. SELECTIVE SUPERIOR MESENTERIC ARTERIOGRAM 3. SELECTIVE INFERIOR MESENTERIC ARTERIOGRAM 4. SUB SELECTIVE LEFT COLIC ARTERIOGRAM 5. SUB SELECTIVE MARGINAL ARTERY ARTERIOGRAM AND PERCUTANEOUS COIL EMBOLIZATION 6. SUB SELECTIVE SIGMOIDAL ARTERIOGRAM COMPARISON:  CTA abdomen and pelvis-earlier same day MEDICATIONS: None ANESTHESIA/SEDATION: Moderate (conscious) sedation was employed during this procedure. A total of Versed 1 mg and  Fentanyl 25 mcg was administered intravenously. Moderate Sedation Time: 62 minutes. The patient's level of consciousness and vital signs were monitored continuously by radiology nursing throughout the procedure under my direct supervision. FLUOROSCOPY TIME:  21 minutes (1,610 mGy) COMPLICATIONS: None immediate. PROCEDURE: Informed consent was obtained from the patient following explanation of the procedure, risks, benefits and alternatives. All questions were addressed. A time out was performed prior to the initiation of the procedure. Maximal barrier sterile technique utilized including caps, mask, sterile gowns, sterile gloves, large sterile drape, hand hygiene, and Betadine prep. The right femoral head was marked fluoroscopically. Under sterile conditions and local anesthesia, the right common femoral artery access was performed with a micropuncture needle. Under direct ultrasound guidance, the right common femoral was accessed with a micropuncture kit. An ultrasound image was saved for documentation purposes. This allowed for placement of a 5-French vascular sheath. A limited arteriogram was performed through the side arm of the sheath confirming appropriate access within the right common femoral artery. Over a Bentson wire, a Mickelson catheter was advanced the caudal aspect of the thoracic aorta where was reformed, back bled and flushed. The Mickelson catheter was then utilized to select the superior mesenteric artery and a selective superior mesenteric arteriogram was performed. The Mickelson catheter was then utilized to select the inferior mesenteric artery and a selective inferior mesenteric arteriogram was performed. Next, with the use of a fathom 14 microwire, a STC Renegade microcatheter was advanced into the left colic artery and a selective left colic arteriogram was performed. Microcatheter was advanced to the level of the marginal artery and a selective marginal artery arteriogram was performed.  Microcatheter was advanced beyond the origin of a tiny distal arcade branch supplying an ill-defined area of active contrast extravasation within the splenic flexure of the colon correlating with the findings on preceding CTA. Next, a short segment of the marginal artery with percutaneously coil embolized with 2 mm and 3 mm diameter interlock coils. The microcatheter was retracted to the more central aspect of the marginal artery and a post embolization marginal arteriogram was performed. Microcatheter was retracted to the level of the left colic artery and a post embolization left colic arteriogram was performed Next, with the use  of the fathom 14 microwire, the microcatheter was utilized to select the sigmoid artery and a sub selective sigmoid arteriogram was performed. The microcatheter was advanced to the opposite side of the embolized segment of the marginal artery and a sub selective marginal arteriogram was performed. Images were reviewed and the procedure was terminated. All wires, catheters and sheaths were removed from the patient. Hemostasis was achieved at the right groin access site with deployment of an ExoSeal closure device and manual compression. Dressings were applied. The patient tolerated the procedure well without immediate post procedural complication. FINDINGS: Selective superior mesenteric arteriogram is negative for significant arterial supply to the ill-defined area of contrast extravasation within the splenic flexure of the colon. Excreted contrast is noted within a focally dilated left superior pole calyx as was demonstrated on preceding abdominal CT. Selective inferior mesenteric arteriogram demonstrates predominant arterial supply to the splenic flexure of the colon via the left colic artery. Sub selective left colic arteriogram demonstrates an ill-defined area of contrast extravasation arising from a tiny arcade branch of the marginal artery. This finding was confirmed with sub  selective marginal artery arteriogram. Ultimately, a short segment of the marginal artery was percutaneously coil embolized with overlapping interlock coils traversing the origin of the tiny distal arcade branch supplying the ill-defined area of active extravasation. Post embolization marginal artery and left colic arteriograms were negative for discrete area of residual contrast extravasation. Given robust arterial supply to the splenic flexure of the colon from the sigmoid artery, a selective sigmoid arteriogram was performed as was a sub selective marginal artery arteriogram supplying the opposite side of the embolized segment of the vessel however both were negative for discrete area of collateral arterial supply to the ill-defined area of active extravasation within the splenic flexure of the colon. IMPRESSION: Technically successful percutaneous coil embolization of a short segment of the marginal artery supplying the origin of a tiny distal arcade which demonstrated active intraluminal contrast extravasation, compatible with the findings seen on preceding CTA. PLAN: - The patient is to remain flat for 4 hours with right leg straight. - The patient will continue to experience several additional bloody bowel movements and may continue to require additional resuscitation (as she was bleeding both before and during the procedure), however ultimately I am hopeful she will stabilize in the coming days. - If there remains clinical concern for persistent lower GI bleeding, would recommend coordinating with the interventional radiology service to perform a nuclear medicine tagged red blood cell study during daytime working hours as to expedite potential repeat angiography if the tagged study is positive. 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 Electronically Signed   By: Sandi Mariscal  M.D.   On: 12/19/2020 16:01   CT ANGIO GI BLEED  Result Date: 12/19/2020 CLINICAL DATA:  Diverticulosis, recurrent diverticular hemorrhage, continued hematochezia EXAM: CTA ABDOMEN AND PELVIS WITHOUT AND WITH CONTRAST TECHNIQUE: Multidetector CT imaging of the abdomen and pelvis was performed using the standard protocol during bolus administration of intravenous contrast. Multiplanar reconstructed images and MIPs were obtained and reviewed to evaluate the vascular anatomy. CONTRAST:  142m OMNIPAQUE IOHEXOL 350 MG/ML SOLN COMPARISON:  CT pelvis, 08/17/2018 FINDINGS: VASCULAR Moderate calcific aortic atherosclerosis. Normal contour and caliber of the abdominal aorta. 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 Otherwise standard branching pattern of the abdominal aorta. Atherosclerosis at the branch vessel origins without significant stenosis. There is an intraluminal arterial contrast blush within the distal transverse colon, just proximal to the splenic flexure, with multiple diverticula in this vicinity (series 7, image 36, series 12, image 66). Review of the MIP images confirms the above findings. NON-VASCULAR Lower chest: No acute abnormality. Hepatobiliary: No focal liver abnormality is seen. No gallstones, gallbladder wall thickening, or biliary dilatation. Pancreas: Unremarkable. No pancreatic ductal dilatation or surrounding inflammatory changes. Spleen: Normal in size without focal abnormality. Adrenals/Urinary Tract: Adrenal glands are unremarkable. Left-sided parapelvic cyst. Kidneys are otherwise normal, without renal calculi, solid lesion, or hydronephrosis. Bladder is unremarkable. Stomach/Bowel: Stomach is within normal limits. Appendix appears normal. No evidence of bowel wall thickening, distention, or inflammatory changes. Lymphatic: No significant vascular  findings are present. No enlarged abdominal or pelvic lymph nodes. Reproductive: Uterus and bilateral adnexa are unremarkable. Other: No abdominal wall hernia or abnormality. No abdominopelvic ascites. Musculoskeletal: No acute or significant osseous findings. IMPRESSION: 1. Intraluminal arterial contrast blush, localizing diverticular hemorrhage to the distal transverse colon just proximal to the splenic flexure. 2.  Pancolonic diverticulosis. 3.  Aortic atherosclerosis. These results will be called to the ordering clinician or representative by the Radiologist Assistant, and communication documented in the PACS or [employer]. Electronically Signed   By: Eddie Candle M.D.   On: 12/19/2020 10:43    Microbiology: Recent Results (from the past 240 hour(s))  SARS CORONAVIRUS 2 (TAT 6-24 HRS) Nasopharyngeal Nasopharyngeal Swab     Status: None   Collection Time: 12/18/20  5:47 PM   Specimen: Nasopharyngeal Swab  Result Value Ref Range Status   SARS Coronavirus 2 NEGATIVE NEGATIVE Final    Comment: (NOTE) SARS-CoV-2 target nucleic acids are NOT DETECTED.  The SARS-CoV-2 RNA is generally detectable in upper and lower respiratory specimens during the acute phase of infection. Negative results do not preclude SARS-CoV-2 infection, do not rule out co-infections with other pathogens, and should not be used as the sole basis for treatment or other patient management decisions. Negative results must be combined with clinical observations, patient history, and epidemiological information. The expected result is Negative.  Fact Sheet for Patients: SugarRoll.be  Fact Sheet for Healthcare Providers: https://www.woods-mathews.com/  This test is not yet approved or cleared by the [place] FDA and  has been authorized for detection and/or diagnosis of SARS-CoV-2 by FDA under an Emergency Use Authorization (EUA). This EUA will remain  in effect (meaning  this test can be used) for the duration of the COVID-19 declaration under Se ction 564(b)(1) of the Act, 21 U.S.C. section 360bbb-3(b)(1), unless the authorization is terminated or revoked sooner.  Performed at [facility], [place] [address], [place], [place] [address]      Labs: Basic Metabolic Panel: Recent Labs  Lab 12/20/20 0028 12/21/20 0141 12/22/20 0619 12/23/20 0450 12/24/20 0136  NA 137 138 133* 129* 130*  K 3.2* 3.2* 3.8 4.1 4.5  CL 107 107 104 93* 95*  CO2 '22 24 25 26 28  ' GLUCOSE 118* 108* 129* 119* 114*  BUN 10 7* <5* <5* <5*  CREATININE 0.49 0.50 0.41* 0.38* 0.48  CALCIUM 7.7* 7.8* 7.7* 8.4* 8.7*  MG 2.0 1.9 1.9 2.0 2.0  PHOS  --   --   --   --  3.8    Liver Function Tests: Recent Labs  Lab 12/18/20 1439 12/24/20 0136  AST 19  --  ALT 12  --   ALKPHOS 36*  --   BILITOT 0.9  --   PROT 5.4*  --   ALBUMIN 3.2* 2.6*    No results for input(s): LIPASE, AMYLASE in the last 168 hours. No results for input(s): AMMONIA in the last 168 hours. CBC: Recent Labs  Lab 12/18/20 1439 12/19/20 0144 12/21/20 1630 12/22/20 0619 12/22/20 1702 12/23/20 0450 12/24/20 0136  WBC 6.5   < > 10.7* 9.9 9.8 9.7 10.0  NEUTROABS 3.7  --   --   --   --  6.8 6.7  HGB 6.4*   < > 7.9* 7.8* 8.6* 8.7* 8.8*  HCT 21.1*   < > 23.9* 23.8* 26.6* 27.0* 27.5*  MCV 89.4   < > 87.2 89.1 90.5 90.6 89.9  PLT 173   < > 144* 150 182 184 203   < > = values in this interval not displayed.    Cardiac Enzymes: No results for input(s): CKTOTAL, CKMB, CKMBINDEX, TROPONINI in the last 168 hours. BNP: BNP (last 3 results) No results for input(s): BNP in the last 8760 hours.  ProBNP (last 3 results) No results for input(s): PROBNP in the last 8760 hours.  CBG: No results for input(s): GLUCAP in the last 168 hours.     Signed:  Nita Sells MD   [facility] 12/24/2020, 7:34 AM

## 2020-12-24 NOTE — Progress Notes (Signed)
Patient stabilized for d/c home--no reports dark stool overnight Eating drinking--sleepy a bit early this am  Nursing has no overnight reports of other issue  D/c home today with [facility] Rest as per updated D/c summary  Verneita Griffes, MD [facility] 7:33 AM

## 2020-12-25 DIAGNOSIS — K5730 Diverticulosis of large intestine without perforation or abscess without bleeding: Secondary | ICD-10-CM | POA: Diagnosis not present

## 2020-12-25 DIAGNOSIS — J449 Chronic obstructive pulmonary disease, unspecified: Secondary | ICD-10-CM | POA: Diagnosis not present

## 2020-12-25 DIAGNOSIS — I700 Atherosclerosis of aorta: Secondary | ICD-10-CM | POA: Diagnosis not present

## 2020-12-25 DIAGNOSIS — H3530 Unspecified macular degeneration: Secondary | ICD-10-CM | POA: Diagnosis not present

## 2020-12-25 DIAGNOSIS — Z9981 Dependence on supplemental oxygen: Secondary | ICD-10-CM | POA: Diagnosis not present

## 2020-12-25 DIAGNOSIS — G2581 Restless legs syndrome: Secondary | ICD-10-CM | POA: Diagnosis not present

## 2020-12-25 DIAGNOSIS — D649 Anemia, unspecified: Secondary | ICD-10-CM | POA: Diagnosis not present

## 2020-12-25 DIAGNOSIS — M8580 Other specified disorders of bone density and structure, unspecified site: Secondary | ICD-10-CM | POA: Diagnosis not present

## 2020-12-25 DIAGNOSIS — E785 Hyperlipidemia, unspecified: Secondary | ICD-10-CM | POA: Diagnosis not present

## 2020-12-25 DIAGNOSIS — E876 Hypokalemia: Secondary | ICD-10-CM | POA: Diagnosis not present

## 2020-12-25 DIAGNOSIS — Z96653 Presence of artificial knee joint, bilateral: Secondary | ICD-10-CM | POA: Diagnosis not present

## 2020-12-25 DIAGNOSIS — R69 Illness, unspecified: Secondary | ICD-10-CM | POA: Diagnosis not present

## 2020-12-25 DIAGNOSIS — E871 Hypo-osmolality and hyponatremia: Secondary | ICD-10-CM | POA: Diagnosis not present

## 2020-12-25 DIAGNOSIS — F419 Anxiety disorder, unspecified: Secondary | ICD-10-CM | POA: Diagnosis not present

## 2020-12-25 DIAGNOSIS — Z85828 Personal history of other malignant neoplasm of skin: Secondary | ICD-10-CM | POA: Diagnosis not present

## 2020-12-25 DIAGNOSIS — H409 Unspecified glaucoma: Secondary | ICD-10-CM | POA: Diagnosis not present

## 2020-12-25 DIAGNOSIS — Z87891 Personal history of nicotine dependence: Secondary | ICD-10-CM | POA: Diagnosis not present

## 2020-12-25 DIAGNOSIS — M150 Primary generalized (osteo)arthritis: Secondary | ICD-10-CM | POA: Diagnosis not present

## 2020-12-25 DIAGNOSIS — D62 Acute posthemorrhagic anemia: Secondary | ICD-10-CM | POA: Diagnosis not present

## 2020-12-25 DIAGNOSIS — E669 Obesity, unspecified: Secondary | ICD-10-CM | POA: Diagnosis not present

## 2020-12-25 DIAGNOSIS — Z6833 Body mass index (BMI) 33.0-33.9, adult: Secondary | ICD-10-CM | POA: Diagnosis not present

## 2020-12-25 DIAGNOSIS — Z8701 Personal history of pneumonia (recurrent): Secondary | ICD-10-CM | POA: Diagnosis not present

## 2020-12-25 DIAGNOSIS — M159 Polyosteoarthritis, unspecified: Secondary | ICD-10-CM | POA: Diagnosis not present

## 2020-12-25 DIAGNOSIS — K922 Gastrointestinal hemorrhage, unspecified: Secondary | ICD-10-CM | POA: Diagnosis not present

## 2020-12-25 DIAGNOSIS — K219 Gastro-esophageal reflux disease without esophagitis: Secondary | ICD-10-CM | POA: Diagnosis not present

## 2020-12-25 DIAGNOSIS — H8110 Benign paroxysmal vertigo, unspecified ear: Secondary | ICD-10-CM | POA: Diagnosis not present

## 2020-12-26 DIAGNOSIS — G2581 Restless legs syndrome: Secondary | ICD-10-CM | POA: Diagnosis not present

## 2020-12-26 DIAGNOSIS — Z96653 Presence of artificial knee joint, bilateral: Secondary | ICD-10-CM | POA: Diagnosis not present

## 2020-12-26 DIAGNOSIS — Z9981 Dependence on supplemental oxygen: Secondary | ICD-10-CM | POA: Diagnosis not present

## 2020-12-26 DIAGNOSIS — D649 Anemia, unspecified: Secondary | ICD-10-CM | POA: Diagnosis not present

## 2020-12-26 DIAGNOSIS — E871 Hypo-osmolality and hyponatremia: Secondary | ICD-10-CM | POA: Diagnosis not present

## 2020-12-26 DIAGNOSIS — Z6833 Body mass index (BMI) 33.0-33.9, adult: Secondary | ICD-10-CM | POA: Diagnosis not present

## 2020-12-26 DIAGNOSIS — I700 Atherosclerosis of aorta: Secondary | ICD-10-CM | POA: Diagnosis not present

## 2020-12-26 DIAGNOSIS — K922 Gastrointestinal hemorrhage, unspecified: Secondary | ICD-10-CM | POA: Diagnosis not present

## 2020-12-26 DIAGNOSIS — J449 Chronic obstructive pulmonary disease, unspecified: Secondary | ICD-10-CM | POA: Diagnosis not present

## 2020-12-26 DIAGNOSIS — K219 Gastro-esophageal reflux disease without esophagitis: Secondary | ICD-10-CM | POA: Diagnosis not present

## 2020-12-26 DIAGNOSIS — E876 Hypokalemia: Secondary | ICD-10-CM | POA: Diagnosis not present

## 2020-12-26 DIAGNOSIS — H8110 Benign paroxysmal vertigo, unspecified ear: Secondary | ICD-10-CM | POA: Diagnosis not present

## 2020-12-26 DIAGNOSIS — K5730 Diverticulosis of large intestine without perforation or abscess without bleeding: Secondary | ICD-10-CM | POA: Diagnosis not present

## 2020-12-26 DIAGNOSIS — H409 Unspecified glaucoma: Secondary | ICD-10-CM | POA: Diagnosis not present

## 2020-12-26 DIAGNOSIS — H3530 Unspecified macular degeneration: Secondary | ICD-10-CM | POA: Diagnosis not present

## 2020-12-26 DIAGNOSIS — F419 Anxiety disorder, unspecified: Secondary | ICD-10-CM | POA: Diagnosis not present

## 2020-12-26 DIAGNOSIS — M159 Polyosteoarthritis, unspecified: Secondary | ICD-10-CM | POA: Diagnosis not present

## 2020-12-26 DIAGNOSIS — M8580 Other specified disorders of bone density and structure, unspecified site: Secondary | ICD-10-CM | POA: Diagnosis not present

## 2020-12-26 DIAGNOSIS — R69 Illness, unspecified: Secondary | ICD-10-CM | POA: Diagnosis not present

## 2020-12-26 DIAGNOSIS — Z8701 Personal history of pneumonia (recurrent): Secondary | ICD-10-CM | POA: Diagnosis not present

## 2020-12-26 DIAGNOSIS — Z85828 Personal history of other malignant neoplasm of skin: Secondary | ICD-10-CM | POA: Diagnosis not present

## 2020-12-26 DIAGNOSIS — Z87891 Personal history of nicotine dependence: Secondary | ICD-10-CM | POA: Diagnosis not present

## 2020-12-26 DIAGNOSIS — E669 Obesity, unspecified: Secondary | ICD-10-CM | POA: Diagnosis not present

## 2020-12-26 DIAGNOSIS — E785 Hyperlipidemia, unspecified: Secondary | ICD-10-CM | POA: Diagnosis not present

## 2020-12-27 DIAGNOSIS — N390 Urinary tract infection, site not specified: Secondary | ICD-10-CM | POA: Diagnosis not present

## 2020-12-28 DIAGNOSIS — K219 Gastro-esophageal reflux disease without esophagitis: Secondary | ICD-10-CM | POA: Diagnosis not present

## 2020-12-28 DIAGNOSIS — M810 Age-related osteoporosis without current pathological fracture: Secondary | ICD-10-CM | POA: Diagnosis not present

## 2020-12-28 DIAGNOSIS — E785 Hyperlipidemia, unspecified: Secondary | ICD-10-CM | POA: Diagnosis not present

## 2020-12-29 DIAGNOSIS — E876 Hypokalemia: Secondary | ICD-10-CM | POA: Diagnosis not present

## 2020-12-29 DIAGNOSIS — H8110 Benign paroxysmal vertigo, unspecified ear: Secondary | ICD-10-CM | POA: Diagnosis not present

## 2020-12-29 DIAGNOSIS — Z6833 Body mass index (BMI) 33.0-33.9, adult: Secondary | ICD-10-CM | POA: Diagnosis not present

## 2020-12-29 DIAGNOSIS — I700 Atherosclerosis of aorta: Secondary | ICD-10-CM | POA: Diagnosis not present

## 2020-12-29 DIAGNOSIS — K922 Gastrointestinal hemorrhage, unspecified: Secondary | ICD-10-CM | POA: Diagnosis not present

## 2020-12-29 DIAGNOSIS — G2581 Restless legs syndrome: Secondary | ICD-10-CM | POA: Diagnosis not present

## 2020-12-29 DIAGNOSIS — H3530 Unspecified macular degeneration: Secondary | ICD-10-CM | POA: Diagnosis not present

## 2020-12-29 DIAGNOSIS — F419 Anxiety disorder, unspecified: Secondary | ICD-10-CM | POA: Diagnosis not present

## 2020-12-29 DIAGNOSIS — Z87891 Personal history of nicotine dependence: Secondary | ICD-10-CM | POA: Diagnosis not present

## 2020-12-29 DIAGNOSIS — E669 Obesity, unspecified: Secondary | ICD-10-CM | POA: Diagnosis not present

## 2020-12-29 DIAGNOSIS — Z9981 Dependence on supplemental oxygen: Secondary | ICD-10-CM | POA: Diagnosis not present

## 2020-12-29 DIAGNOSIS — Z8701 Personal history of pneumonia (recurrent): Secondary | ICD-10-CM | POA: Diagnosis not present

## 2020-12-29 DIAGNOSIS — H409 Unspecified glaucoma: Secondary | ICD-10-CM | POA: Diagnosis not present

## 2020-12-29 DIAGNOSIS — M8580 Other specified disorders of bone density and structure, unspecified site: Secondary | ICD-10-CM | POA: Diagnosis not present

## 2020-12-29 DIAGNOSIS — D649 Anemia, unspecified: Secondary | ICD-10-CM | POA: Diagnosis not present

## 2020-12-29 DIAGNOSIS — Z85828 Personal history of other malignant neoplasm of skin: Secondary | ICD-10-CM | POA: Diagnosis not present

## 2020-12-29 DIAGNOSIS — J449 Chronic obstructive pulmonary disease, unspecified: Secondary | ICD-10-CM | POA: Diagnosis not present

## 2020-12-29 DIAGNOSIS — K219 Gastro-esophageal reflux disease without esophagitis: Secondary | ICD-10-CM | POA: Diagnosis not present

## 2020-12-29 DIAGNOSIS — Z96653 Presence of artificial knee joint, bilateral: Secondary | ICD-10-CM | POA: Diagnosis not present

## 2020-12-29 DIAGNOSIS — K5730 Diverticulosis of large intestine without perforation or abscess without bleeding: Secondary | ICD-10-CM | POA: Diagnosis not present

## 2020-12-29 DIAGNOSIS — E871 Hypo-osmolality and hyponatremia: Secondary | ICD-10-CM | POA: Diagnosis not present

## 2020-12-29 DIAGNOSIS — E785 Hyperlipidemia, unspecified: Secondary | ICD-10-CM | POA: Diagnosis not present

## 2020-12-29 DIAGNOSIS — R69 Illness, unspecified: Secondary | ICD-10-CM | POA: Diagnosis not present

## 2020-12-29 DIAGNOSIS — M159 Polyosteoarthritis, unspecified: Secondary | ICD-10-CM | POA: Diagnosis not present

## 2021-01-01 DIAGNOSIS — Z8701 Personal history of pneumonia (recurrent): Secondary | ICD-10-CM | POA: Diagnosis not present

## 2021-01-01 DIAGNOSIS — M159 Polyosteoarthritis, unspecified: Secondary | ICD-10-CM | POA: Diagnosis not present

## 2021-01-01 DIAGNOSIS — M8580 Other specified disorders of bone density and structure, unspecified site: Secondary | ICD-10-CM | POA: Diagnosis not present

## 2021-01-01 DIAGNOSIS — K219 Gastro-esophageal reflux disease without esophagitis: Secondary | ICD-10-CM | POA: Diagnosis not present

## 2021-01-01 DIAGNOSIS — Z96653 Presence of artificial knee joint, bilateral: Secondary | ICD-10-CM | POA: Diagnosis not present

## 2021-01-01 DIAGNOSIS — Z9981 Dependence on supplemental oxygen: Secondary | ICD-10-CM | POA: Diagnosis not present

## 2021-01-01 DIAGNOSIS — H409 Unspecified glaucoma: Secondary | ICD-10-CM | POA: Diagnosis not present

## 2021-01-01 DIAGNOSIS — Z87891 Personal history of nicotine dependence: Secondary | ICD-10-CM | POA: Diagnosis not present

## 2021-01-01 DIAGNOSIS — F419 Anxiety disorder, unspecified: Secondary | ICD-10-CM | POA: Diagnosis not present

## 2021-01-01 DIAGNOSIS — Z85828 Personal history of other malignant neoplasm of skin: Secondary | ICD-10-CM | POA: Diagnosis not present

## 2021-01-01 DIAGNOSIS — R69 Illness, unspecified: Secondary | ICD-10-CM | POA: Diagnosis not present

## 2021-01-01 DIAGNOSIS — E669 Obesity, unspecified: Secondary | ICD-10-CM | POA: Diagnosis not present

## 2021-01-01 DIAGNOSIS — G2581 Restless legs syndrome: Secondary | ICD-10-CM | POA: Diagnosis not present

## 2021-01-01 DIAGNOSIS — E785 Hyperlipidemia, unspecified: Secondary | ICD-10-CM | POA: Diagnosis not present

## 2021-01-01 DIAGNOSIS — J449 Chronic obstructive pulmonary disease, unspecified: Secondary | ICD-10-CM | POA: Diagnosis not present

## 2021-01-01 DIAGNOSIS — H8110 Benign paroxysmal vertigo, unspecified ear: Secondary | ICD-10-CM | POA: Diagnosis not present

## 2021-01-01 DIAGNOSIS — E871 Hypo-osmolality and hyponatremia: Secondary | ICD-10-CM | POA: Diagnosis not present

## 2021-01-01 DIAGNOSIS — K922 Gastrointestinal hemorrhage, unspecified: Secondary | ICD-10-CM | POA: Diagnosis not present

## 2021-01-01 DIAGNOSIS — I700 Atherosclerosis of aorta: Secondary | ICD-10-CM | POA: Diagnosis not present

## 2021-01-01 DIAGNOSIS — H3530 Unspecified macular degeneration: Secondary | ICD-10-CM | POA: Diagnosis not present

## 2021-01-01 DIAGNOSIS — K5730 Diverticulosis of large intestine without perforation or abscess without bleeding: Secondary | ICD-10-CM | POA: Diagnosis not present

## 2021-01-01 DIAGNOSIS — Z6833 Body mass index (BMI) 33.0-33.9, adult: Secondary | ICD-10-CM | POA: Diagnosis not present

## 2021-01-01 DIAGNOSIS — E876 Hypokalemia: Secondary | ICD-10-CM | POA: Diagnosis not present

## 2021-01-01 DIAGNOSIS — D649 Anemia, unspecified: Secondary | ICD-10-CM | POA: Diagnosis not present

## 2021-01-03 DIAGNOSIS — E669 Obesity, unspecified: Secondary | ICD-10-CM | POA: Diagnosis not present

## 2021-01-03 DIAGNOSIS — R0609 Other forms of dyspnea: Secondary | ICD-10-CM | POA: Diagnosis not present

## 2021-01-03 DIAGNOSIS — Z96653 Presence of artificial knee joint, bilateral: Secondary | ICD-10-CM | POA: Diagnosis not present

## 2021-01-03 DIAGNOSIS — Z85828 Personal history of other malignant neoplasm of skin: Secondary | ICD-10-CM | POA: Diagnosis not present

## 2021-01-03 DIAGNOSIS — Z9981 Dependence on supplemental oxygen: Secondary | ICD-10-CM | POA: Diagnosis not present

## 2021-01-03 DIAGNOSIS — E871 Hypo-osmolality and hyponatremia: Secondary | ICD-10-CM | POA: Diagnosis not present

## 2021-01-03 DIAGNOSIS — E876 Hypokalemia: Secondary | ICD-10-CM | POA: Diagnosis not present

## 2021-01-03 DIAGNOSIS — R69 Illness, unspecified: Secondary | ICD-10-CM | POA: Diagnosis not present

## 2021-01-03 DIAGNOSIS — H8110 Benign paroxysmal vertigo, unspecified ear: Secondary | ICD-10-CM | POA: Diagnosis not present

## 2021-01-03 DIAGNOSIS — K922 Gastrointestinal hemorrhage, unspecified: Secondary | ICD-10-CM | POA: Diagnosis not present

## 2021-01-03 DIAGNOSIS — K219 Gastro-esophageal reflux disease without esophagitis: Secondary | ICD-10-CM | POA: Diagnosis not present

## 2021-01-03 DIAGNOSIS — H409 Unspecified glaucoma: Secondary | ICD-10-CM | POA: Diagnosis not present

## 2021-01-03 DIAGNOSIS — F419 Anxiety disorder, unspecified: Secondary | ICD-10-CM | POA: Diagnosis not present

## 2021-01-03 DIAGNOSIS — I700 Atherosclerosis of aorta: Secondary | ICD-10-CM | POA: Diagnosis not present

## 2021-01-03 DIAGNOSIS — J449 Chronic obstructive pulmonary disease, unspecified: Secondary | ICD-10-CM | POA: Diagnosis not present

## 2021-01-03 DIAGNOSIS — M8580 Other specified disorders of bone density and structure, unspecified site: Secondary | ICD-10-CM | POA: Diagnosis not present

## 2021-01-03 DIAGNOSIS — G2581 Restless legs syndrome: Secondary | ICD-10-CM | POA: Diagnosis not present

## 2021-01-03 DIAGNOSIS — D649 Anemia, unspecified: Secondary | ICD-10-CM | POA: Diagnosis not present

## 2021-01-03 DIAGNOSIS — M159 Polyosteoarthritis, unspecified: Secondary | ICD-10-CM | POA: Diagnosis not present

## 2021-01-03 DIAGNOSIS — K5730 Diverticulosis of large intestine without perforation or abscess without bleeding: Secondary | ICD-10-CM | POA: Diagnosis not present

## 2021-01-03 DIAGNOSIS — Z87891 Personal history of nicotine dependence: Secondary | ICD-10-CM | POA: Diagnosis not present

## 2021-01-03 DIAGNOSIS — R0901 Asphyxia: Secondary | ICD-10-CM | POA: Diagnosis not present

## 2021-01-03 DIAGNOSIS — H3530 Unspecified macular degeneration: Secondary | ICD-10-CM | POA: Diagnosis not present

## 2021-01-03 DIAGNOSIS — Z8701 Personal history of pneumonia (recurrent): Secondary | ICD-10-CM | POA: Diagnosis not present

## 2021-01-03 DIAGNOSIS — E785 Hyperlipidemia, unspecified: Secondary | ICD-10-CM | POA: Diagnosis not present

## 2021-01-03 DIAGNOSIS — Z6833 Body mass index (BMI) 33.0-33.9, adult: Secondary | ICD-10-CM | POA: Diagnosis not present

## 2021-01-05 DIAGNOSIS — E785 Hyperlipidemia, unspecified: Secondary | ICD-10-CM | POA: Diagnosis not present

## 2021-01-05 DIAGNOSIS — Z85828 Personal history of other malignant neoplasm of skin: Secondary | ICD-10-CM | POA: Diagnosis not present

## 2021-01-05 DIAGNOSIS — D649 Anemia, unspecified: Secondary | ICD-10-CM | POA: Diagnosis not present

## 2021-01-05 DIAGNOSIS — H8110 Benign paroxysmal vertigo, unspecified ear: Secondary | ICD-10-CM | POA: Diagnosis not present

## 2021-01-05 DIAGNOSIS — Z87891 Personal history of nicotine dependence: Secondary | ICD-10-CM | POA: Diagnosis not present

## 2021-01-05 DIAGNOSIS — Z6833 Body mass index (BMI) 33.0-33.9, adult: Secondary | ICD-10-CM | POA: Diagnosis not present

## 2021-01-05 DIAGNOSIS — M159 Polyosteoarthritis, unspecified: Secondary | ICD-10-CM | POA: Diagnosis not present

## 2021-01-05 DIAGNOSIS — G2581 Restless legs syndrome: Secondary | ICD-10-CM | POA: Diagnosis not present

## 2021-01-05 DIAGNOSIS — K922 Gastrointestinal hemorrhage, unspecified: Secondary | ICD-10-CM | POA: Diagnosis not present

## 2021-01-05 DIAGNOSIS — E871 Hypo-osmolality and hyponatremia: Secondary | ICD-10-CM | POA: Diagnosis not present

## 2021-01-05 DIAGNOSIS — I700 Atherosclerosis of aorta: Secondary | ICD-10-CM | POA: Diagnosis not present

## 2021-01-05 DIAGNOSIS — H409 Unspecified glaucoma: Secondary | ICD-10-CM | POA: Diagnosis not present

## 2021-01-05 DIAGNOSIS — K219 Gastro-esophageal reflux disease without esophagitis: Secondary | ICD-10-CM | POA: Diagnosis not present

## 2021-01-05 DIAGNOSIS — M8580 Other specified disorders of bone density and structure, unspecified site: Secondary | ICD-10-CM | POA: Diagnosis not present

## 2021-01-05 DIAGNOSIS — K5730 Diverticulosis of large intestine without perforation or abscess without bleeding: Secondary | ICD-10-CM | POA: Diagnosis not present

## 2021-01-05 DIAGNOSIS — H3530 Unspecified macular degeneration: Secondary | ICD-10-CM | POA: Diagnosis not present

## 2021-01-05 DIAGNOSIS — J449 Chronic obstructive pulmonary disease, unspecified: Secondary | ICD-10-CM | POA: Diagnosis not present

## 2021-01-05 DIAGNOSIS — Z8701 Personal history of pneumonia (recurrent): Secondary | ICD-10-CM | POA: Diagnosis not present

## 2021-01-05 DIAGNOSIS — E876 Hypokalemia: Secondary | ICD-10-CM | POA: Diagnosis not present

## 2021-01-05 DIAGNOSIS — Z9981 Dependence on supplemental oxygen: Secondary | ICD-10-CM | POA: Diagnosis not present

## 2021-01-05 DIAGNOSIS — R69 Illness, unspecified: Secondary | ICD-10-CM | POA: Diagnosis not present

## 2021-01-05 DIAGNOSIS — E669 Obesity, unspecified: Secondary | ICD-10-CM | POA: Diagnosis not present

## 2021-01-05 DIAGNOSIS — Z96653 Presence of artificial knee joint, bilateral: Secondary | ICD-10-CM | POA: Diagnosis not present

## 2021-01-05 DIAGNOSIS — F419 Anxiety disorder, unspecified: Secondary | ICD-10-CM | POA: Diagnosis not present

## 2021-01-08 DIAGNOSIS — Z85828 Personal history of other malignant neoplasm of skin: Secondary | ICD-10-CM | POA: Diagnosis not present

## 2021-01-08 DIAGNOSIS — Z6833 Body mass index (BMI) 33.0-33.9, adult: Secondary | ICD-10-CM | POA: Diagnosis not present

## 2021-01-08 DIAGNOSIS — H3530 Unspecified macular degeneration: Secondary | ICD-10-CM | POA: Diagnosis not present

## 2021-01-08 DIAGNOSIS — K219 Gastro-esophageal reflux disease without esophagitis: Secondary | ICD-10-CM | POA: Diagnosis not present

## 2021-01-08 DIAGNOSIS — Z87891 Personal history of nicotine dependence: Secondary | ICD-10-CM | POA: Diagnosis not present

## 2021-01-08 DIAGNOSIS — H409 Unspecified glaucoma: Secondary | ICD-10-CM | POA: Diagnosis not present

## 2021-01-08 DIAGNOSIS — M8580 Other specified disorders of bone density and structure, unspecified site: Secondary | ICD-10-CM | POA: Diagnosis not present

## 2021-01-08 DIAGNOSIS — K5730 Diverticulosis of large intestine without perforation or abscess without bleeding: Secondary | ICD-10-CM | POA: Diagnosis not present

## 2021-01-08 DIAGNOSIS — R69 Illness, unspecified: Secondary | ICD-10-CM | POA: Diagnosis not present

## 2021-01-08 DIAGNOSIS — D649 Anemia, unspecified: Secondary | ICD-10-CM | POA: Diagnosis not present

## 2021-01-08 DIAGNOSIS — E785 Hyperlipidemia, unspecified: Secondary | ICD-10-CM | POA: Diagnosis not present

## 2021-01-08 DIAGNOSIS — E669 Obesity, unspecified: Secondary | ICD-10-CM | POA: Diagnosis not present

## 2021-01-08 DIAGNOSIS — Z96653 Presence of artificial knee joint, bilateral: Secondary | ICD-10-CM | POA: Diagnosis not present

## 2021-01-08 DIAGNOSIS — Z8701 Personal history of pneumonia (recurrent): Secondary | ICD-10-CM | POA: Diagnosis not present

## 2021-01-08 DIAGNOSIS — G2581 Restless legs syndrome: Secondary | ICD-10-CM | POA: Diagnosis not present

## 2021-01-08 DIAGNOSIS — Z9981 Dependence on supplemental oxygen: Secondary | ICD-10-CM | POA: Diagnosis not present

## 2021-01-08 DIAGNOSIS — K922 Gastrointestinal hemorrhage, unspecified: Secondary | ICD-10-CM | POA: Diagnosis not present

## 2021-01-08 DIAGNOSIS — E871 Hypo-osmolality and hyponatremia: Secondary | ICD-10-CM | POA: Diagnosis not present

## 2021-01-08 DIAGNOSIS — F419 Anxiety disorder, unspecified: Secondary | ICD-10-CM | POA: Diagnosis not present

## 2021-01-08 DIAGNOSIS — I700 Atherosclerosis of aorta: Secondary | ICD-10-CM | POA: Diagnosis not present

## 2021-01-08 DIAGNOSIS — M159 Polyosteoarthritis, unspecified: Secondary | ICD-10-CM | POA: Diagnosis not present

## 2021-01-08 DIAGNOSIS — E876 Hypokalemia: Secondary | ICD-10-CM | POA: Diagnosis not present

## 2021-01-08 DIAGNOSIS — J449 Chronic obstructive pulmonary disease, unspecified: Secondary | ICD-10-CM | POA: Diagnosis not present

## 2021-01-08 DIAGNOSIS — H8110 Benign paroxysmal vertigo, unspecified ear: Secondary | ICD-10-CM | POA: Diagnosis not present

## 2021-01-10 DIAGNOSIS — E785 Hyperlipidemia, unspecified: Secondary | ICD-10-CM | POA: Diagnosis not present

## 2021-01-10 DIAGNOSIS — Z87891 Personal history of nicotine dependence: Secondary | ICD-10-CM | POA: Diagnosis not present

## 2021-01-10 DIAGNOSIS — Z85828 Personal history of other malignant neoplasm of skin: Secondary | ICD-10-CM | POA: Diagnosis not present

## 2021-01-10 DIAGNOSIS — I700 Atherosclerosis of aorta: Secondary | ICD-10-CM | POA: Diagnosis not present

## 2021-01-10 DIAGNOSIS — G2581 Restless legs syndrome: Secondary | ICD-10-CM | POA: Diagnosis not present

## 2021-01-10 DIAGNOSIS — H3530 Unspecified macular degeneration: Secondary | ICD-10-CM | POA: Diagnosis not present

## 2021-01-10 DIAGNOSIS — Z6833 Body mass index (BMI) 33.0-33.9, adult: Secondary | ICD-10-CM | POA: Diagnosis not present

## 2021-01-10 DIAGNOSIS — J449 Chronic obstructive pulmonary disease, unspecified: Secondary | ICD-10-CM | POA: Diagnosis not present

## 2021-01-10 DIAGNOSIS — M8580 Other specified disorders of bone density and structure, unspecified site: Secondary | ICD-10-CM | POA: Diagnosis not present

## 2021-01-10 DIAGNOSIS — E669 Obesity, unspecified: Secondary | ICD-10-CM | POA: Diagnosis not present

## 2021-01-10 DIAGNOSIS — R69 Illness, unspecified: Secondary | ICD-10-CM | POA: Diagnosis not present

## 2021-01-10 DIAGNOSIS — E871 Hypo-osmolality and hyponatremia: Secondary | ICD-10-CM | POA: Diagnosis not present

## 2021-01-10 DIAGNOSIS — F419 Anxiety disorder, unspecified: Secondary | ICD-10-CM | POA: Diagnosis not present

## 2021-01-10 DIAGNOSIS — H8110 Benign paroxysmal vertigo, unspecified ear: Secondary | ICD-10-CM | POA: Diagnosis not present

## 2021-01-10 DIAGNOSIS — K922 Gastrointestinal hemorrhage, unspecified: Secondary | ICD-10-CM | POA: Diagnosis not present

## 2021-01-10 DIAGNOSIS — D649 Anemia, unspecified: Secondary | ICD-10-CM | POA: Diagnosis not present

## 2021-01-10 DIAGNOSIS — Z9981 Dependence on supplemental oxygen: Secondary | ICD-10-CM | POA: Diagnosis not present

## 2021-01-10 DIAGNOSIS — K219 Gastro-esophageal reflux disease without esophagitis: Secondary | ICD-10-CM | POA: Diagnosis not present

## 2021-01-10 DIAGNOSIS — E876 Hypokalemia: Secondary | ICD-10-CM | POA: Diagnosis not present

## 2021-01-10 DIAGNOSIS — K5730 Diverticulosis of large intestine without perforation or abscess without bleeding: Secondary | ICD-10-CM | POA: Diagnosis not present

## 2021-01-10 DIAGNOSIS — M159 Polyosteoarthritis, unspecified: Secondary | ICD-10-CM | POA: Diagnosis not present

## 2021-01-10 DIAGNOSIS — H409 Unspecified glaucoma: Secondary | ICD-10-CM | POA: Diagnosis not present

## 2021-01-10 DIAGNOSIS — Z8701 Personal history of pneumonia (recurrent): Secondary | ICD-10-CM | POA: Diagnosis not present

## 2021-01-10 DIAGNOSIS — Z96653 Presence of artificial knee joint, bilateral: Secondary | ICD-10-CM | POA: Diagnosis not present

## 2021-01-11 DIAGNOSIS — R82998 Other abnormal findings in urine: Secondary | ICD-10-CM | POA: Diagnosis not present

## 2021-01-12 DIAGNOSIS — F419 Anxiety disorder, unspecified: Secondary | ICD-10-CM | POA: Diagnosis not present

## 2021-01-12 DIAGNOSIS — E669 Obesity, unspecified: Secondary | ICD-10-CM | POA: Diagnosis not present

## 2021-01-12 DIAGNOSIS — H3530 Unspecified macular degeneration: Secondary | ICD-10-CM | POA: Diagnosis not present

## 2021-01-12 DIAGNOSIS — H8110 Benign paroxysmal vertigo, unspecified ear: Secondary | ICD-10-CM | POA: Diagnosis not present

## 2021-01-12 DIAGNOSIS — M159 Polyosteoarthritis, unspecified: Secondary | ICD-10-CM | POA: Diagnosis not present

## 2021-01-12 DIAGNOSIS — Z96653 Presence of artificial knee joint, bilateral: Secondary | ICD-10-CM | POA: Diagnosis not present

## 2021-01-12 DIAGNOSIS — E785 Hyperlipidemia, unspecified: Secondary | ICD-10-CM | POA: Diagnosis not present

## 2021-01-12 DIAGNOSIS — I700 Atherosclerosis of aorta: Secondary | ICD-10-CM | POA: Diagnosis not present

## 2021-01-12 DIAGNOSIS — Z9981 Dependence on supplemental oxygen: Secondary | ICD-10-CM | POA: Diagnosis not present

## 2021-01-12 DIAGNOSIS — G2581 Restless legs syndrome: Secondary | ICD-10-CM | POA: Diagnosis not present

## 2021-01-12 DIAGNOSIS — R69 Illness, unspecified: Secondary | ICD-10-CM | POA: Diagnosis not present

## 2021-01-12 DIAGNOSIS — E871 Hypo-osmolality and hyponatremia: Secondary | ICD-10-CM | POA: Diagnosis not present

## 2021-01-12 DIAGNOSIS — M8580 Other specified disorders of bone density and structure, unspecified site: Secondary | ICD-10-CM | POA: Diagnosis not present

## 2021-01-12 DIAGNOSIS — H409 Unspecified glaucoma: Secondary | ICD-10-CM | POA: Diagnosis not present

## 2021-01-12 DIAGNOSIS — Z6833 Body mass index (BMI) 33.0-33.9, adult: Secondary | ICD-10-CM | POA: Diagnosis not present

## 2021-01-12 DIAGNOSIS — E876 Hypokalemia: Secondary | ICD-10-CM | POA: Diagnosis not present

## 2021-01-12 DIAGNOSIS — Z8701 Personal history of pneumonia (recurrent): Secondary | ICD-10-CM | POA: Diagnosis not present

## 2021-01-12 DIAGNOSIS — D649 Anemia, unspecified: Secondary | ICD-10-CM | POA: Diagnosis not present

## 2021-01-12 DIAGNOSIS — K922 Gastrointestinal hemorrhage, unspecified: Secondary | ICD-10-CM | POA: Diagnosis not present

## 2021-01-12 DIAGNOSIS — K5730 Diverticulosis of large intestine without perforation or abscess without bleeding: Secondary | ICD-10-CM | POA: Diagnosis not present

## 2021-01-12 DIAGNOSIS — Z85828 Personal history of other malignant neoplasm of skin: Secondary | ICD-10-CM | POA: Diagnosis not present

## 2021-01-12 DIAGNOSIS — K219 Gastro-esophageal reflux disease without esophagitis: Secondary | ICD-10-CM | POA: Diagnosis not present

## 2021-01-12 DIAGNOSIS — J449 Chronic obstructive pulmonary disease, unspecified: Secondary | ICD-10-CM | POA: Diagnosis not present

## 2021-01-12 DIAGNOSIS — Z87891 Personal history of nicotine dependence: Secondary | ICD-10-CM | POA: Diagnosis not present

## 2021-01-15 DIAGNOSIS — D649 Anemia, unspecified: Secondary | ICD-10-CM | POA: Diagnosis not present

## 2021-01-15 DIAGNOSIS — R69 Illness, unspecified: Secondary | ICD-10-CM | POA: Diagnosis not present

## 2021-01-15 DIAGNOSIS — M8580 Other specified disorders of bone density and structure, unspecified site: Secondary | ICD-10-CM | POA: Diagnosis not present

## 2021-01-15 DIAGNOSIS — F419 Anxiety disorder, unspecified: Secondary | ICD-10-CM | POA: Diagnosis not present

## 2021-01-15 DIAGNOSIS — E785 Hyperlipidemia, unspecified: Secondary | ICD-10-CM | POA: Diagnosis not present

## 2021-01-15 DIAGNOSIS — K219 Gastro-esophageal reflux disease without esophagitis: Secondary | ICD-10-CM | POA: Diagnosis not present

## 2021-01-15 DIAGNOSIS — Z96653 Presence of artificial knee joint, bilateral: Secondary | ICD-10-CM | POA: Diagnosis not present

## 2021-01-15 DIAGNOSIS — M159 Polyosteoarthritis, unspecified: Secondary | ICD-10-CM | POA: Diagnosis not present

## 2021-01-15 DIAGNOSIS — Z87891 Personal history of nicotine dependence: Secondary | ICD-10-CM | POA: Diagnosis not present

## 2021-01-15 DIAGNOSIS — Z85828 Personal history of other malignant neoplasm of skin: Secondary | ICD-10-CM | POA: Diagnosis not present

## 2021-01-15 DIAGNOSIS — H8110 Benign paroxysmal vertigo, unspecified ear: Secondary | ICD-10-CM | POA: Diagnosis not present

## 2021-01-15 DIAGNOSIS — H3530 Unspecified macular degeneration: Secondary | ICD-10-CM | POA: Diagnosis not present

## 2021-01-15 DIAGNOSIS — E871 Hypo-osmolality and hyponatremia: Secondary | ICD-10-CM | POA: Diagnosis not present

## 2021-01-15 DIAGNOSIS — E876 Hypokalemia: Secondary | ICD-10-CM | POA: Diagnosis not present

## 2021-01-15 DIAGNOSIS — Z9981 Dependence on supplemental oxygen: Secondary | ICD-10-CM | POA: Diagnosis not present

## 2021-01-15 DIAGNOSIS — G2581 Restless legs syndrome: Secondary | ICD-10-CM | POA: Diagnosis not present

## 2021-01-15 DIAGNOSIS — Z8701 Personal history of pneumonia (recurrent): Secondary | ICD-10-CM | POA: Diagnosis not present

## 2021-01-15 DIAGNOSIS — J449 Chronic obstructive pulmonary disease, unspecified: Secondary | ICD-10-CM | POA: Diagnosis not present

## 2021-01-15 DIAGNOSIS — K5730 Diverticulosis of large intestine without perforation or abscess without bleeding: Secondary | ICD-10-CM | POA: Diagnosis not present

## 2021-01-15 DIAGNOSIS — H409 Unspecified glaucoma: Secondary | ICD-10-CM | POA: Diagnosis not present

## 2021-01-15 DIAGNOSIS — E669 Obesity, unspecified: Secondary | ICD-10-CM | POA: Diagnosis not present

## 2021-01-15 DIAGNOSIS — I700 Atherosclerosis of aorta: Secondary | ICD-10-CM | POA: Diagnosis not present

## 2021-01-15 DIAGNOSIS — Z6833 Body mass index (BMI) 33.0-33.9, adult: Secondary | ICD-10-CM | POA: Diagnosis not present

## 2021-01-15 DIAGNOSIS — K922 Gastrointestinal hemorrhage, unspecified: Secondary | ICD-10-CM | POA: Diagnosis not present

## 2021-01-18 ENCOUNTER — Encounter (INDEPENDENT_AMBULATORY_CARE_PROVIDER_SITE_OTHER): Payer: Medicare HMO | Admitting: Ophthalmology

## 2021-01-19 DIAGNOSIS — G2581 Restless legs syndrome: Secondary | ICD-10-CM | POA: Diagnosis not present

## 2021-01-19 DIAGNOSIS — Z8701 Personal history of pneumonia (recurrent): Secondary | ICD-10-CM | POA: Diagnosis not present

## 2021-01-19 DIAGNOSIS — Z6833 Body mass index (BMI) 33.0-33.9, adult: Secondary | ICD-10-CM | POA: Diagnosis not present

## 2021-01-19 DIAGNOSIS — M159 Polyosteoarthritis, unspecified: Secondary | ICD-10-CM | POA: Diagnosis not present

## 2021-01-19 DIAGNOSIS — Z96653 Presence of artificial knee joint, bilateral: Secondary | ICD-10-CM | POA: Diagnosis not present

## 2021-01-19 DIAGNOSIS — H8110 Benign paroxysmal vertigo, unspecified ear: Secondary | ICD-10-CM | POA: Diagnosis not present

## 2021-01-19 DIAGNOSIS — H3530 Unspecified macular degeneration: Secondary | ICD-10-CM | POA: Diagnosis not present

## 2021-01-19 DIAGNOSIS — D649 Anemia, unspecified: Secondary | ICD-10-CM | POA: Diagnosis not present

## 2021-01-19 DIAGNOSIS — I700 Atherosclerosis of aorta: Secondary | ICD-10-CM | POA: Diagnosis not present

## 2021-01-19 DIAGNOSIS — R69 Illness, unspecified: Secondary | ICD-10-CM | POA: Diagnosis not present

## 2021-01-19 DIAGNOSIS — Z9981 Dependence on supplemental oxygen: Secondary | ICD-10-CM | POA: Diagnosis not present

## 2021-01-19 DIAGNOSIS — Z87891 Personal history of nicotine dependence: Secondary | ICD-10-CM | POA: Diagnosis not present

## 2021-01-19 DIAGNOSIS — M8580 Other specified disorders of bone density and structure, unspecified site: Secondary | ICD-10-CM | POA: Diagnosis not present

## 2021-01-19 DIAGNOSIS — Z85828 Personal history of other malignant neoplasm of skin: Secondary | ICD-10-CM | POA: Diagnosis not present

## 2021-01-19 DIAGNOSIS — K5730 Diverticulosis of large intestine without perforation or abscess without bleeding: Secondary | ICD-10-CM | POA: Diagnosis not present

## 2021-01-19 DIAGNOSIS — E876 Hypokalemia: Secondary | ICD-10-CM | POA: Diagnosis not present

## 2021-01-19 DIAGNOSIS — E785 Hyperlipidemia, unspecified: Secondary | ICD-10-CM | POA: Diagnosis not present

## 2021-01-19 DIAGNOSIS — E669 Obesity, unspecified: Secondary | ICD-10-CM | POA: Diagnosis not present

## 2021-01-19 DIAGNOSIS — F419 Anxiety disorder, unspecified: Secondary | ICD-10-CM | POA: Diagnosis not present

## 2021-01-19 DIAGNOSIS — J449 Chronic obstructive pulmonary disease, unspecified: Secondary | ICD-10-CM | POA: Diagnosis not present

## 2021-01-19 DIAGNOSIS — K922 Gastrointestinal hemorrhage, unspecified: Secondary | ICD-10-CM | POA: Diagnosis not present

## 2021-01-19 DIAGNOSIS — K219 Gastro-esophageal reflux disease without esophagitis: Secondary | ICD-10-CM | POA: Diagnosis not present

## 2021-01-19 DIAGNOSIS — H409 Unspecified glaucoma: Secondary | ICD-10-CM | POA: Diagnosis not present

## 2021-01-19 DIAGNOSIS — E871 Hypo-osmolality and hyponatremia: Secondary | ICD-10-CM | POA: Diagnosis not present

## 2021-01-22 DIAGNOSIS — D649 Anemia, unspecified: Secondary | ICD-10-CM | POA: Diagnosis not present

## 2021-01-22 DIAGNOSIS — J449 Chronic obstructive pulmonary disease, unspecified: Secondary | ICD-10-CM | POA: Diagnosis not present

## 2021-01-22 DIAGNOSIS — Z96653 Presence of artificial knee joint, bilateral: Secondary | ICD-10-CM | POA: Diagnosis not present

## 2021-01-22 DIAGNOSIS — E669 Obesity, unspecified: Secondary | ICD-10-CM | POA: Diagnosis not present

## 2021-01-22 DIAGNOSIS — K922 Gastrointestinal hemorrhage, unspecified: Secondary | ICD-10-CM | POA: Diagnosis not present

## 2021-01-22 DIAGNOSIS — G2581 Restless legs syndrome: Secondary | ICD-10-CM | POA: Diagnosis not present

## 2021-01-22 DIAGNOSIS — F419 Anxiety disorder, unspecified: Secondary | ICD-10-CM | POA: Diagnosis not present

## 2021-01-22 DIAGNOSIS — K219 Gastro-esophageal reflux disease without esophagitis: Secondary | ICD-10-CM | POA: Diagnosis not present

## 2021-01-22 DIAGNOSIS — Z6833 Body mass index (BMI) 33.0-33.9, adult: Secondary | ICD-10-CM | POA: Diagnosis not present

## 2021-01-22 DIAGNOSIS — Z9981 Dependence on supplemental oxygen: Secondary | ICD-10-CM | POA: Diagnosis not present

## 2021-01-22 DIAGNOSIS — Z8701 Personal history of pneumonia (recurrent): Secondary | ICD-10-CM | POA: Diagnosis not present

## 2021-01-22 DIAGNOSIS — K5730 Diverticulosis of large intestine without perforation or abscess without bleeding: Secondary | ICD-10-CM | POA: Diagnosis not present

## 2021-01-22 DIAGNOSIS — Z85828 Personal history of other malignant neoplasm of skin: Secondary | ICD-10-CM | POA: Diagnosis not present

## 2021-01-22 DIAGNOSIS — E785 Hyperlipidemia, unspecified: Secondary | ICD-10-CM | POA: Diagnosis not present

## 2021-01-22 DIAGNOSIS — R69 Illness, unspecified: Secondary | ICD-10-CM | POA: Diagnosis not present

## 2021-01-22 DIAGNOSIS — M8580 Other specified disorders of bone density and structure, unspecified site: Secondary | ICD-10-CM | POA: Diagnosis not present

## 2021-01-22 DIAGNOSIS — E876 Hypokalemia: Secondary | ICD-10-CM | POA: Diagnosis not present

## 2021-01-22 DIAGNOSIS — Z87891 Personal history of nicotine dependence: Secondary | ICD-10-CM | POA: Diagnosis not present

## 2021-01-22 DIAGNOSIS — H8110 Benign paroxysmal vertigo, unspecified ear: Secondary | ICD-10-CM | POA: Diagnosis not present

## 2021-01-22 DIAGNOSIS — E871 Hypo-osmolality and hyponatremia: Secondary | ICD-10-CM | POA: Diagnosis not present

## 2021-01-22 DIAGNOSIS — H409 Unspecified glaucoma: Secondary | ICD-10-CM | POA: Diagnosis not present

## 2021-01-22 DIAGNOSIS — I700 Atherosclerosis of aorta: Secondary | ICD-10-CM | POA: Diagnosis not present

## 2021-01-22 DIAGNOSIS — M159 Polyosteoarthritis, unspecified: Secondary | ICD-10-CM | POA: Diagnosis not present

## 2021-01-22 DIAGNOSIS — H3530 Unspecified macular degeneration: Secondary | ICD-10-CM | POA: Diagnosis not present

## 2021-01-23 DIAGNOSIS — H3530 Unspecified macular degeneration: Secondary | ICD-10-CM | POA: Diagnosis not present

## 2021-01-23 DIAGNOSIS — R69 Illness, unspecified: Secondary | ICD-10-CM | POA: Diagnosis not present

## 2021-01-23 DIAGNOSIS — E785 Hyperlipidemia, unspecified: Secondary | ICD-10-CM | POA: Diagnosis not present

## 2021-01-23 DIAGNOSIS — H409 Unspecified glaucoma: Secondary | ICD-10-CM | POA: Diagnosis not present

## 2021-01-23 DIAGNOSIS — E871 Hypo-osmolality and hyponatremia: Secondary | ICD-10-CM | POA: Diagnosis not present

## 2021-01-23 DIAGNOSIS — Z96653 Presence of artificial knee joint, bilateral: Secondary | ICD-10-CM | POA: Diagnosis not present

## 2021-01-23 DIAGNOSIS — E876 Hypokalemia: Secondary | ICD-10-CM | POA: Diagnosis not present

## 2021-01-23 DIAGNOSIS — K922 Gastrointestinal hemorrhage, unspecified: Secondary | ICD-10-CM | POA: Diagnosis not present

## 2021-01-23 DIAGNOSIS — K219 Gastro-esophageal reflux disease without esophagitis: Secondary | ICD-10-CM | POA: Diagnosis not present

## 2021-01-23 DIAGNOSIS — J449 Chronic obstructive pulmonary disease, unspecified: Secondary | ICD-10-CM | POA: Diagnosis not present

## 2021-01-23 DIAGNOSIS — D649 Anemia, unspecified: Secondary | ICD-10-CM | POA: Diagnosis not present

## 2021-01-23 DIAGNOSIS — Z87891 Personal history of nicotine dependence: Secondary | ICD-10-CM | POA: Diagnosis not present

## 2021-01-23 DIAGNOSIS — Z8701 Personal history of pneumonia (recurrent): Secondary | ICD-10-CM | POA: Diagnosis not present

## 2021-01-23 DIAGNOSIS — F419 Anxiety disorder, unspecified: Secondary | ICD-10-CM | POA: Diagnosis not present

## 2021-01-23 DIAGNOSIS — E669 Obesity, unspecified: Secondary | ICD-10-CM | POA: Diagnosis not present

## 2021-01-23 DIAGNOSIS — H8110 Benign paroxysmal vertigo, unspecified ear: Secondary | ICD-10-CM | POA: Diagnosis not present

## 2021-01-23 DIAGNOSIS — Z9981 Dependence on supplemental oxygen: Secondary | ICD-10-CM | POA: Diagnosis not present

## 2021-01-23 DIAGNOSIS — Z85828 Personal history of other malignant neoplasm of skin: Secondary | ICD-10-CM | POA: Diagnosis not present

## 2021-01-23 DIAGNOSIS — Z6833 Body mass index (BMI) 33.0-33.9, adult: Secondary | ICD-10-CM | POA: Diagnosis not present

## 2021-01-23 DIAGNOSIS — M159 Polyosteoarthritis, unspecified: Secondary | ICD-10-CM | POA: Diagnosis not present

## 2021-01-23 DIAGNOSIS — M8580 Other specified disorders of bone density and structure, unspecified site: Secondary | ICD-10-CM | POA: Diagnosis not present

## 2021-01-23 DIAGNOSIS — I700 Atherosclerosis of aorta: Secondary | ICD-10-CM | POA: Diagnosis not present

## 2021-01-23 DIAGNOSIS — G2581 Restless legs syndrome: Secondary | ICD-10-CM | POA: Diagnosis not present

## 2021-01-23 DIAGNOSIS — K5730 Diverticulosis of large intestine without perforation or abscess without bleeding: Secondary | ICD-10-CM | POA: Diagnosis not present

## 2021-01-24 DIAGNOSIS — E669 Obesity, unspecified: Secondary | ICD-10-CM | POA: Diagnosis not present

## 2021-01-24 DIAGNOSIS — Z9981 Dependence on supplemental oxygen: Secondary | ICD-10-CM | POA: Diagnosis not present

## 2021-01-24 DIAGNOSIS — I700 Atherosclerosis of aorta: Secondary | ICD-10-CM | POA: Diagnosis not present

## 2021-01-24 DIAGNOSIS — Z96653 Presence of artificial knee joint, bilateral: Secondary | ICD-10-CM | POA: Diagnosis not present

## 2021-01-24 DIAGNOSIS — M8580 Other specified disorders of bone density and structure, unspecified site: Secondary | ICD-10-CM | POA: Diagnosis not present

## 2021-01-24 DIAGNOSIS — E876 Hypokalemia: Secondary | ICD-10-CM | POA: Diagnosis not present

## 2021-01-24 DIAGNOSIS — E871 Hypo-osmolality and hyponatremia: Secondary | ICD-10-CM | POA: Diagnosis not present

## 2021-01-24 DIAGNOSIS — D649 Anemia, unspecified: Secondary | ICD-10-CM | POA: Diagnosis not present

## 2021-01-24 DIAGNOSIS — Z8701 Personal history of pneumonia (recurrent): Secondary | ICD-10-CM | POA: Diagnosis not present

## 2021-01-24 DIAGNOSIS — J449 Chronic obstructive pulmonary disease, unspecified: Secondary | ICD-10-CM | POA: Diagnosis not present

## 2021-01-24 DIAGNOSIS — K5730 Diverticulosis of large intestine without perforation or abscess without bleeding: Secondary | ICD-10-CM | POA: Diagnosis not present

## 2021-01-24 DIAGNOSIS — N390 Urinary tract infection, site not specified: Secondary | ICD-10-CM | POA: Diagnosis not present

## 2021-01-24 DIAGNOSIS — H409 Unspecified glaucoma: Secondary | ICD-10-CM | POA: Diagnosis not present

## 2021-01-24 DIAGNOSIS — K219 Gastro-esophageal reflux disease without esophagitis: Secondary | ICD-10-CM | POA: Diagnosis not present

## 2021-01-24 DIAGNOSIS — H3530 Unspecified macular degeneration: Secondary | ICD-10-CM | POA: Diagnosis not present

## 2021-01-24 DIAGNOSIS — K922 Gastrointestinal hemorrhage, unspecified: Secondary | ICD-10-CM | POA: Diagnosis not present

## 2021-01-24 DIAGNOSIS — E785 Hyperlipidemia, unspecified: Secondary | ICD-10-CM | POA: Diagnosis not present

## 2021-01-24 DIAGNOSIS — R69 Illness, unspecified: Secondary | ICD-10-CM | POA: Diagnosis not present

## 2021-01-24 DIAGNOSIS — Z87891 Personal history of nicotine dependence: Secondary | ICD-10-CM | POA: Diagnosis not present

## 2021-01-24 DIAGNOSIS — F419 Anxiety disorder, unspecified: Secondary | ICD-10-CM | POA: Diagnosis not present

## 2021-01-24 DIAGNOSIS — M159 Polyosteoarthritis, unspecified: Secondary | ICD-10-CM | POA: Diagnosis not present

## 2021-01-24 DIAGNOSIS — G2581 Restless legs syndrome: Secondary | ICD-10-CM | POA: Diagnosis not present

## 2021-01-24 DIAGNOSIS — Z85828 Personal history of other malignant neoplasm of skin: Secondary | ICD-10-CM | POA: Diagnosis not present

## 2021-01-24 DIAGNOSIS — Z6833 Body mass index (BMI) 33.0-33.9, adult: Secondary | ICD-10-CM | POA: Diagnosis not present

## 2021-01-24 DIAGNOSIS — H8110 Benign paroxysmal vertigo, unspecified ear: Secondary | ICD-10-CM | POA: Diagnosis not present

## 2021-02-01 ENCOUNTER — Emergency Department (HOSPITAL_COMMUNITY): Payer: Medicare HMO

## 2021-02-01 ENCOUNTER — Other Ambulatory Visit: Payer: Self-pay

## 2021-02-01 ENCOUNTER — Emergency Department (HOSPITAL_COMMUNITY)
Admission: EM | Admit: 2021-02-01 | Discharge: 2021-02-02 | Disposition: A | Discharge status: Home / Self Care | Payer: Medicare HMO | Attending: Emergency Medicine | Admitting: Emergency Medicine

## 2021-02-01 DIAGNOSIS — W01198A Fall on same level from slipping, tripping and stumbling with subsequent striking against other object, initial encounter: Secondary | ICD-10-CM | POA: Insufficient documentation

## 2021-02-01 DIAGNOSIS — Z85828 Personal history of other malignant neoplasm of skin: Secondary | ICD-10-CM | POA: Insufficient documentation

## 2021-02-01 DIAGNOSIS — J449 Chronic obstructive pulmonary disease, unspecified: Secondary | ICD-10-CM | POA: Diagnosis not present

## 2021-02-01 DIAGNOSIS — Z87891 Personal history of nicotine dependence: Secondary | ICD-10-CM | POA: Insufficient documentation

## 2021-02-01 DIAGNOSIS — S82841A Displaced bimalleolar fracture of right lower leg, initial encounter for closed fracture: Secondary | ICD-10-CM | POA: Diagnosis not present

## 2021-02-01 DIAGNOSIS — S0990XA Unspecified injury of head, initial encounter: Secondary | ICD-10-CM | POA: Diagnosis not present

## 2021-02-01 DIAGNOSIS — R0902 Hypoxemia: Secondary | ICD-10-CM | POA: Diagnosis not present

## 2021-02-01 DIAGNOSIS — S8991XA Unspecified injury of right lower leg, initial encounter: Secondary | ICD-10-CM | POA: Diagnosis not present

## 2021-02-01 DIAGNOSIS — M7989 Other specified soft tissue disorders: Secondary | ICD-10-CM | POA: Diagnosis not present

## 2021-02-01 DIAGNOSIS — Z96652 Presence of left artificial knee joint: Secondary | ICD-10-CM | POA: Insufficient documentation

## 2021-02-01 DIAGNOSIS — I959 Hypotension, unspecified: Secondary | ICD-10-CM | POA: Diagnosis not present

## 2021-02-01 DIAGNOSIS — M25551 Pain in right hip: Secondary | ICD-10-CM | POA: Diagnosis not present

## 2021-02-01 DIAGNOSIS — Z96651 Presence of right artificial knee joint: Secondary | ICD-10-CM | POA: Diagnosis not present

## 2021-02-01 DIAGNOSIS — M25571 Pain in right ankle and joints of right foot: Secondary | ICD-10-CM | POA: Diagnosis not present

## 2021-02-01 DIAGNOSIS — W19XXXA Unspecified fall, initial encounter: Secondary | ICD-10-CM

## 2021-02-01 NOTE — ED Provider Notes (Signed)
[facility] EMERGENCY DEPARTMENT Provider Note   CSN: [id number] Arrival date & time: 02/01/21  2053     History Chief Complaint  Patient presents with   Leg Injury    [name] is a 80 y.o. female.  80 year old female presents with complaint of pain in her right lower leg.  Patient states that she tripped on a cane that was on the floor 6 days ago causing her to turn her right ankle and she has had pain trying to bear weight on this leg ever since.  Patient denies hitting her head or loss of consciousness, she is not on blood thinners.  She denies pain in her hips or pelvis.  No other injuries, complaints, concerns.      Past Medical History:  Diagnosis Date   Anxiety    Arthritis    "bilateral knees, shoulders, elbows; neck, pretty widespread" (09/05/2017)   BPPV (benign paroxysmal positional vertigo)    Depression    GERD (gastroesophageal reflux disease)    Glaucoma, both eyes    Headache    "probably 2/month" (09/05/2017)   History of blood transfusion ~ 2008   "related to LGIB"   Hyperlipemia    Lower GI bleeding ~ 2008; 09/05/2017   "had to have blood transfusion"   Macular degeneration, bilateral    Osteopenia    Seasonal allergies    Skin cancer, basal cell 2001   "off my nose, left side"   Sleeping excessive    Tinnitus of both ears     Patient Active Problem List   Diagnosis Date Noted   Symptomatic anemia    DOE (dyspnea on exertion) 11/18/2017   COPD GOLD II  11/18/2017   Diverticulosis of intestine with bleeding 09/05/2017   Influenza A 08/08/2017   Acute respiratory failure with hypoxia ([place]) 08/07/2017   Depression 08/07/2017   Hyperlipemia 08/07/2017   Macular degeneration 08/07/2017   Glaucoma 08/07/2017   Weakness 08/07/2017   Dehydration 08/07/2017   Lower GI bleeding 10/14/2016   Diverticulosis of colon 10/14/2016   Anxiety 10/14/2016   Lower GI bleed 10/14/2016   Acute GI bleeding    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   Dermatochalasis of both upper eyelids 08/17/2014   Osteoarthrosis, unspecified whether generalized or localized, lower leg 12/20/2011    Past Surgical History:  Procedure Laterality Date   BASAL CELL CARCINOMA EXCISION  2001   "off my nose, left side"   BLEPHAROPLASTY Bilateral    CATARACT EXTRACTION W/ INTRAOCULAR LENS  IMPLANT, BILATERAL Bilateral 1990's   EYE SURGERY Bilateral    "to improve vision after cataract OR"   IR ANGIOGRAM FOLLOW UP STUDY  12/19/2020   IR ANGIOGRAM SELECTIVE EACH ADDITIONAL VESSEL  12/19/2020   IR ANGIOGRAM SELECTIVE EACH ADDITIONAL VESSEL  12/19/2020   IR ANGIOGRAM VISCERAL SELECTIVE  12/19/2020   IR EMBO ART  VEN HEMORR LYMPH EXTRAV  INC GUIDE ROADMAPPING  12/19/2020   IR US GUIDE VASC ACCESS RIGHT  12/19/2020   JOINT REPLACEMENT     STAPEDES SURGERY Left    "scraped stapedes because it was sticking when it wasn't suppose to"   [place] Left ~ 2008   TOTAL KNEE ARTHROPLASTY  12/20/2011   Procedure: TOTAL KNEE ARTHROPLASTY;  Surgeon: Augustin Schooling, MD;  Location: [facility];  Service: Orthopedics;  Laterality: Right;  Right Total Knee Arthroplasty   TUBAL LIGATION  1980's  OB History   No obstetric history on file.     Family History  Problem Relation Age of Onset   Heart attack Mother    Heart disease Father    Heart failure Father    Bladder Cancer Father    Supraventricular tachycardia Sister    Hypertension Sister     Social History   Tobacco Use   Smoking status: Former    Packs/day: 0.75    Years: 37.00    Pack years: 27.75    Types: Cigarettes    Quit date: 07/01/1992    Years since quitting: 28.6   Smokeless tobacco: Never  Vaping Use   Vaping Use: Never used  Substance Use Topics   Alcohol use: Yes    Alcohol/week: 7.0 standard drinks    Types: 7 Glasses of wine per week    Comment: 09/05/2017  "6 oz wine, 5 days/wk"   Drug use: No    Types:  Hydrocodone    Home Medications Prior to Admission medications   Medication Sig Start Date End Date Taking? Authorizing Provider  acetaminophen (TYLENOL) 500 MG tablet Take 1,000 mg by mouth daily as needed for headache.   Yes [provider]  albuterol (PROVENTIL HFA;VENTOLIN HFA) 108 (90 Base) MCG/ACT inhaler Inhale 2 puffs into the lungs daily as needed. For seasonal allergies . Use 2 puffs 3 times daily x 4 days then back to home regimen. Patient taking differently: Inhale 2 puffs into the lungs daily as needed for wheezing or shortness of breath. 08/10/17  Yes Eugenie Filler, MD  dorzolamide-timolol (COSOPT) 22.3-6.8 MG/ML ophthalmic solution Place 1 drop into both eyes 2 (two) times daily.   Yes [provider]  HYDROcodone-acetaminophen (NORCO/VICODIN) 5-325 MG tablet Take 1 tablet by mouth every 6 (six) hours as needed. 12/25/20  Yes [provider]  latanoprost (XALATAN) 0.005 % ophthalmic solution Place 1 drop into both eyes at bedtime. 02/13/18  Yes [provider]  loratadine (CLARITIN) 10 MG tablet Take 1 tablet (10 mg total) by mouth daily. 08/11/17  Yes Eugenie Filler, MD  Multiple Vitamin (MULTIVITAMIN WITH MINERALS) TABS Take 1 tablet by mouth daily.   Yes [provider]  omeprazole (PRILOSEC) 20 MG capsule Take 20 mg by mouth every evening.   Yes [provider]  OXYGEN 2lpm with sleep only  [employer]   Yes [provider]  polyethylene glycol (MIRALAX / GLYCOLAX) packet Take 8.5 g by mouth daily.   Yes [provider]  rOPINIRole (REQUIP) 0.5 MG tablet Take 1 tablet (0.5 mg total) by mouth at bedtime. Patient taking differently: Take 0.5 mg by mouth at bedtime as needed (restless legs). 12/23/20  Yes Nita Sells, MD  sertraline (ZOLOFT) 100 MG tablet Take 1 tablet (100 mg total) by mouth daily. 12/23/20  Yes Nita Sells, MD  simvastatin (ZOCOR) 20 MG tablet Take 20 mg by mouth daily at  6 PM.   Yes [provider]  sulfamethoxazole-trimethoprim (BACTRIM DS) 800-160 MG tablet Take 1 tablet by mouth See admin instructions. Bid x 7 days 01/24/21  Yes [provider]  ALPRAZolam (XANAX) 0.25 MG tablet Take 1 tablet (0.25 mg total) by mouth daily as needed for anxiety. Patient not taking: Reported on 02/02/2021 09/14/17   Aline August, MD  DULoxetine (CYMBALTA) 20 MG capsule Take 1 capsule (20 mg total) by mouth daily. Patient not taking: No sig reported 12/23/20   Nita Sells, MD  nitrofurantoin, macrocrystal-monohydrate, (MACROBID) 100 MG capsule Take  100 mg by mouth See admin instructions. hs x 7 days Patient not taking: No sig reported 01/15/21   [provider]    Allergies    Morphine and related, Abaloparatide, Aspirin, Duloxetine hcl, Ventolin [albuterol], and Cefadroxil  Review of Systems   Review of Systems  Constitutional:  Negative for fever.  Gastrointestinal:  Negative for nausea and vomiting.  Musculoskeletal:  Positive for arthralgias and gait problem. Negative for back pain and neck pain.  Skin:  Negative for rash and wound.  Allergic/Immunologic: Negative for immunocompromised state.  Neurological:  Negative for weakness, numbness and headaches.  Hematological:  Does not bruise/bleed easily.  Psychiatric/Behavioral:  Negative for confusion.   All other systems reviewed and are negative.  Physical Exam Updated Vital Signs BP 118/78   Pulse 80   Resp 20   Ht '5\' 4"'$  (1.626 m)   Wt 81.6 kg   SpO2 95%   BMI 30.90 kg/m   Physical Exam Vitals and nursing note reviewed.  Constitutional:      General: She is not in acute distress.    Appearance: She is well-developed. She is not diaphoretic.  HENT:     Head: Normocephalic and atraumatic.  Eyes:     Extraocular Movements: Extraocular movements intact.     Pupils: Pupils are equal, round, and reactive to light.  Cardiovascular:     Rate and Rhythm: Normal rate and  regular rhythm.     Pulses: Normal pulses.     Heart sounds: Normal heart sounds.  Pulmonary:     Effort: Pulmonary effort is normal.  Abdominal:     Palpations: Abdomen is soft.     Tenderness: There is no abdominal tenderness.  Musculoskeletal:        General: Tenderness present. No swelling or deformity.     Cervical back: Normal range of motion and neck supple. No tenderness or bony tenderness.     Thoracic back: No tenderness or bony tenderness.     Lumbar back: No tenderness or bony tenderness.     Right lower leg: No edema.     Left lower leg: No edema.     Comments: Tenderness with palpation of the right knee as well as right ankle.  No pain with logroll of right hip.  Pelvis is stable and nontender.  DP pulses present, sensation intact, brisk capillary refill present.  Skin:    General: Skin is warm and dry.     Findings: No erythema or rash.  Neurological:     Mental Status: She is alert and oriented to person, place, and time.     Sensory: No sensory deficit.     Motor: No weakness.  Psychiatric:        Behavior: Behavior normal.    ED Results / Procedures / Treatments   Labs (all labs ordered are listed, but only abnormal results are displayed) Labs Reviewed - No data to display  EKG None  Radiology DG Ankle Complete Right  Result Date: 02/01/2021 CLINICAL DATA:  Fall, right ankle pain EXAM: RIGHT ANKLE - COMPLETE 3+ VIEW COMPARISON:  None. FINDINGS: Three view radiograph right ankle demonstrates a oblique fracture of the distal right fibula extending above the level of the tibial plafond did into the expected distal aspect of the tibia fibular syndesmosis. There is 1 cortical with lateral displacement and near anatomic alignment of the distal fracture fragment. There is, additionally, a transverse fracture of the medial malleolus at the level of the tibial plafond which  demonstrates minimal displacement and near anatomic alignment. The ankle mortise is aligned. A an  ankle effusion is present. Mild bimalleolar soft tissue swelling is present. There is superimposed diffuse subcutaneous edema of the right lower extremity. IMPRESSION: Mildly displaced anatomically aligned bimalleolar right ankle fracture. Electronically Signed   By: Fidela Salisbury MD   On: 02/01/2021 23:01   DG Knee Complete 4 Views Right  Result Date: 02/01/2021 CLINICAL DATA:  Right total knee arthroplasty EXAM: RIGHT KNEE - COMPLETE 4+ VIEW COMPARISON:  None. FINDINGS: Right total knee arthroplasty has been performed. Normal alignment. No fracture or dislocation. No effusion. Soft tissues are unremarkable. IMPRESSION: Negative. Electronically Signed   By: Fidela Salisbury MD   On: 02/01/2021 23:09   DG Hip Unilat With Pelvis 2-3 Views Right  Result Date: 02/01/2021 CLINICAL DATA:  Fall, right hip pain EXAM: DG HIP (WITH OR WITHOUT PELVIS) 2-3V RIGHT COMPARISON:  None. FINDINGS: Normal alignment. No fracture or dislocation. Mild bilateral degenerative hip arthritis. Soft tissues are unremarkable. IMPRESSION: No fracture or dislocation. Electronically Signed   By: Fidela Salisbury MD   On: 02/01/2021 22:58    Procedures Procedures   Medications Ordered in ED Medications - No data to display  ED Course  I have reviewed the triage vital signs and the nursing notes.  Pertinent labs & imaging results that were available during my care of the patient were reviewed by me and considered in my medical decision making (see chart for details).  Clinical Course as of 02/02/21 0029  Fri Feb 03, 6028  [id number] 80 year old female with complaint of right lower leg pain after a fall which occurred 6 days ago when she tripped on her cane and rolled her ankle. She is found to have tenderness in her right lower leg without deformity, skin is intact, DP pulse present with sensation intact, no pain with logroll of the hips, no pain over the pelvis.  Exam is otherwise unremarkable. X-ray shows a nondisplaced bimalleolar  fracture of the right ankle.  X-ray of the knee and hip are unremarkable. Discussed results with patient who would like to be discharged home with a boot to use her walker however will need assistance to get into the house. Called to patient's husband to discuss results at patient's request.  Husband is concerned that she has not been able to walk her usual and is concerned about a possible head injury.  While patient denies hitting her head and states that she has been unable to walk because of pain in her right lower leg, I will add on a CT of her head for further evaluation. [LM]  [id number] Care signed out pending CT head.  If patient is able to be cleared for discharge, husband requests call back at that time. [LM]    Clinical Course User Index [LM] Roque Lias   MDM Rules/Calculators/A&P                           Final Clinical Impression(s) / ED Diagnoses Final diagnoses:  Closed bimalleolar fracture of right ankle, initial encounter  Fall, initial encounter    Rx / DC Orders ED Discharge Orders     None        Roque Lias 02/02/21 [id number]    Veryl Speak, MD 02/02/21 [id number]

## 2021-02-01 NOTE — ED Triage Notes (Signed)
BIB EMS  pt's son reports she tripped and fell 6 days ago hitting her head and turning her right ankle.  Pt is not on blood thinners.  Pt's son wished her to be evaluated.  Pt uses a walker at home but is having trouble bearing weight on her right leg.  Pt's only concern at time of triage is her leg.  Normally wears compression stockings but hasn't today so it is unclear what amount of swelling is normal for her.

## 2021-02-02 ENCOUNTER — Emergency Department (HOSPITAL_COMMUNITY): Payer: Medicare HMO

## 2021-02-02 DIAGNOSIS — Z743 Need for continuous supervision: Secondary | ICD-10-CM | POA: Diagnosis not present

## 2021-02-02 DIAGNOSIS — S0990XA Unspecified injury of head, initial encounter: Secondary | ICD-10-CM | POA: Diagnosis not present

## 2021-02-02 DIAGNOSIS — W19XXXA Unspecified fall, initial encounter: Secondary | ICD-10-CM | POA: Diagnosis not present

## 2021-02-02 DIAGNOSIS — R29898 Other symptoms and signs involving the musculoskeletal system: Secondary | ICD-10-CM | POA: Diagnosis not present

## 2021-02-02 NOTE — Progress Notes (Signed)
Orthopedic Tech Progress Note Patient Details:  [name] 1940/07/23 [id number]  Ortho Devices Type of Ortho Device: CAM walker Ortho Device/Splint Location: RLE Ortho Device/Splint Interventions: Ordered, Application, Adjustment   Post Interventions Patient Tolerated: Fair Instructions Provided: Adjustment of device, Care of device, Poper ambulation with device  [name] 02/02/2021, 12:49 AM

## 2021-02-02 NOTE — ED Notes (Signed)
Spoke with husband, Clair Gulling, who is coming to get patient.

## 2021-02-02 NOTE — Discharge Instructions (Signed)
Wear boot.  You may apply ice and elevate as needed.  Follow-up with orthopedics, call to schedule an appointment. He may either follow-up with the orthopedist you have seen in the past or referral given tonight.

## 2021-02-02 NOTE — ED Notes (Signed)
[facility] Called 

## 2021-02-02 NOTE — ED Notes (Signed)
[facility] called to come and transport patient home. Husband states he will not be able to get her in the house

## 2021-02-03 ENCOUNTER — Telehealth: Payer: Self-pay

## 2021-02-03 DIAGNOSIS — R0609 Other forms of dyspnea: Secondary | ICD-10-CM | POA: Diagnosis not present

## 2021-02-03 DIAGNOSIS — J449 Chronic obstructive pulmonary disease, unspecified: Secondary | ICD-10-CM | POA: Diagnosis not present

## 2021-02-03 DIAGNOSIS — R0901 Asphyxia: Secondary | ICD-10-CM | POA: Diagnosis not present

## 2021-02-03 NOTE — Telephone Encounter (Signed)
Patients husband called in [place] his wife who was seen in the ED, he states he cannot care for her and doesn't know what to do. Left a voice message for him to call this CM back

## 2021-02-13 DIAGNOSIS — Z743 Need for continuous supervision: Secondary | ICD-10-CM | POA: Diagnosis not present

## 2021-02-13 DIAGNOSIS — R531 Weakness: Secondary | ICD-10-CM | POA: Diagnosis not present

## 2021-02-13 DIAGNOSIS — Z7401 Bed confinement status: Secondary | ICD-10-CM | POA: Diagnosis not present

## 2021-02-13 DIAGNOSIS — S82841A Displaced bimalleolar fracture of right lower leg, initial encounter for closed fracture: Secondary | ICD-10-CM | POA: Diagnosis not present

## 2021-02-13 DIAGNOSIS — R5381 Other malaise: Secondary | ICD-10-CM | POA: Diagnosis not present

## 2021-02-23 DIAGNOSIS — D649 Anemia, unspecified: Secondary | ICD-10-CM | POA: Diagnosis not present

## 2021-02-23 DIAGNOSIS — R69 Illness, unspecified: Secondary | ICD-10-CM | POA: Diagnosis not present

## 2021-02-23 DIAGNOSIS — J449 Chronic obstructive pulmonary disease, unspecified: Secondary | ICD-10-CM | POA: Diagnosis not present

## 2021-02-23 DIAGNOSIS — H3530 Unspecified macular degeneration: Secondary | ICD-10-CM | POA: Diagnosis not present

## 2021-02-23 DIAGNOSIS — H409 Unspecified glaucoma: Secondary | ICD-10-CM | POA: Diagnosis not present

## 2021-02-23 DIAGNOSIS — E876 Hypokalemia: Secondary | ICD-10-CM | POA: Diagnosis not present

## 2021-02-23 DIAGNOSIS — E871 Hypo-osmolality and hyponatremia: Secondary | ICD-10-CM | POA: Diagnosis not present

## 2021-02-23 DIAGNOSIS — K929 Disease of digestive system, unspecified: Secondary | ICD-10-CM | POA: Diagnosis not present

## 2021-02-23 DIAGNOSIS — G2581 Restless legs syndrome: Secondary | ICD-10-CM | POA: Diagnosis not present

## 2021-02-23 DIAGNOSIS — M159 Polyosteoarthritis, unspecified: Secondary | ICD-10-CM | POA: Diagnosis not present

## 2021-02-26 DIAGNOSIS — E669 Obesity, unspecified: Secondary | ICD-10-CM | POA: Diagnosis not present

## 2021-02-26 DIAGNOSIS — K922 Gastrointestinal hemorrhage, unspecified: Secondary | ICD-10-CM | POA: Diagnosis not present

## 2021-02-26 DIAGNOSIS — E785 Hyperlipidemia, unspecified: Secondary | ICD-10-CM | POA: Diagnosis not present

## 2021-02-26 DIAGNOSIS — F32A Depression, unspecified: Secondary | ICD-10-CM | POA: Diagnosis not present

## 2021-02-26 DIAGNOSIS — G2581 Restless legs syndrome: Secondary | ICD-10-CM | POA: Diagnosis not present

## 2021-02-26 DIAGNOSIS — E871 Hypo-osmolality and hyponatremia: Secondary | ICD-10-CM | POA: Diagnosis not present

## 2021-02-26 DIAGNOSIS — K219 Gastro-esophageal reflux disease without esophagitis: Secondary | ICD-10-CM | POA: Diagnosis not present

## 2021-02-26 DIAGNOSIS — J449 Chronic obstructive pulmonary disease, unspecified: Secondary | ICD-10-CM | POA: Diagnosis not present

## 2021-02-26 DIAGNOSIS — M8580 Other specified disorders of bone density and structure, unspecified site: Secondary | ICD-10-CM | POA: Diagnosis not present

## 2021-02-26 DIAGNOSIS — R69 Illness, unspecified: Secondary | ICD-10-CM | POA: Diagnosis not present

## 2021-02-26 DIAGNOSIS — Z9981 Dependence on supplemental oxygen: Secondary | ICD-10-CM | POA: Diagnosis not present

## 2021-02-26 DIAGNOSIS — D649 Anemia, unspecified: Secondary | ICD-10-CM | POA: Diagnosis not present

## 2021-02-26 DIAGNOSIS — M159 Polyosteoarthritis, unspecified: Secondary | ICD-10-CM | POA: Diagnosis not present

## 2021-02-26 DIAGNOSIS — Z85828 Personal history of other malignant neoplasm of skin: Secondary | ICD-10-CM | POA: Diagnosis not present

## 2021-02-26 DIAGNOSIS — E876 Hypokalemia: Secondary | ICD-10-CM | POA: Diagnosis not present

## 2021-02-26 DIAGNOSIS — H3530 Unspecified macular degeneration: Secondary | ICD-10-CM | POA: Diagnosis not present

## 2021-02-26 DIAGNOSIS — I700 Atherosclerosis of aorta: Secondary | ICD-10-CM | POA: Diagnosis not present

## 2021-02-26 DIAGNOSIS — Z8701 Personal history of pneumonia (recurrent): Secondary | ICD-10-CM | POA: Diagnosis not present

## 2021-02-26 DIAGNOSIS — K5730 Diverticulosis of large intestine without perforation or abscess without bleeding: Secondary | ICD-10-CM | POA: Diagnosis not present

## 2021-02-26 DIAGNOSIS — Z96653 Presence of artificial knee joint, bilateral: Secondary | ICD-10-CM | POA: Diagnosis not present

## 2021-02-26 DIAGNOSIS — H8110 Benign paroxysmal vertigo, unspecified ear: Secondary | ICD-10-CM | POA: Diagnosis not present

## 2021-02-26 DIAGNOSIS — H409 Unspecified glaucoma: Secondary | ICD-10-CM | POA: Diagnosis not present

## 2021-02-26 DIAGNOSIS — F419 Anxiety disorder, unspecified: Secondary | ICD-10-CM | POA: Diagnosis not present

## 2021-02-26 DIAGNOSIS — Z6833 Body mass index (BMI) 33.0-33.9, adult: Secondary | ICD-10-CM | POA: Diagnosis not present

## 2021-02-26 DIAGNOSIS — S82841A Displaced bimalleolar fracture of right lower leg, initial encounter for closed fracture: Secondary | ICD-10-CM | POA: Diagnosis not present

## 2021-02-28 DIAGNOSIS — I700 Atherosclerosis of aorta: Secondary | ICD-10-CM | POA: Diagnosis not present

## 2021-02-28 DIAGNOSIS — Z9981 Dependence on supplemental oxygen: Secondary | ICD-10-CM | POA: Diagnosis not present

## 2021-02-28 DIAGNOSIS — H3530 Unspecified macular degeneration: Secondary | ICD-10-CM | POA: Diagnosis not present

## 2021-02-28 DIAGNOSIS — K5730 Diverticulosis of large intestine without perforation or abscess without bleeding: Secondary | ICD-10-CM | POA: Diagnosis not present

## 2021-02-28 DIAGNOSIS — E785 Hyperlipidemia, unspecified: Secondary | ICD-10-CM | POA: Diagnosis not present

## 2021-02-28 DIAGNOSIS — R69 Illness, unspecified: Secondary | ICD-10-CM | POA: Diagnosis not present

## 2021-02-28 DIAGNOSIS — M8580 Other specified disorders of bone density and structure, unspecified site: Secondary | ICD-10-CM | POA: Diagnosis not present

## 2021-02-28 DIAGNOSIS — S82841A Displaced bimalleolar fracture of right lower leg, initial encounter for closed fracture: Secondary | ICD-10-CM | POA: Diagnosis not present

## 2021-02-28 DIAGNOSIS — K219 Gastro-esophageal reflux disease without esophagitis: Secondary | ICD-10-CM | POA: Diagnosis not present

## 2021-02-28 DIAGNOSIS — E871 Hypo-osmolality and hyponatremia: Secondary | ICD-10-CM | POA: Diagnosis not present

## 2021-02-28 DIAGNOSIS — D649 Anemia, unspecified: Secondary | ICD-10-CM | POA: Diagnosis not present

## 2021-02-28 DIAGNOSIS — H409 Unspecified glaucoma: Secondary | ICD-10-CM | POA: Diagnosis not present

## 2021-02-28 DIAGNOSIS — G2581 Restless legs syndrome: Secondary | ICD-10-CM | POA: Diagnosis not present

## 2021-02-28 DIAGNOSIS — Z8701 Personal history of pneumonia (recurrent): Secondary | ICD-10-CM | POA: Diagnosis not present

## 2021-02-28 DIAGNOSIS — K922 Gastrointestinal hemorrhage, unspecified: Secondary | ICD-10-CM | POA: Diagnosis not present

## 2021-02-28 DIAGNOSIS — E876 Hypokalemia: Secondary | ICD-10-CM | POA: Diagnosis not present

## 2021-02-28 DIAGNOSIS — F419 Anxiety disorder, unspecified: Secondary | ICD-10-CM | POA: Diagnosis not present

## 2021-02-28 DIAGNOSIS — J449 Chronic obstructive pulmonary disease, unspecified: Secondary | ICD-10-CM | POA: Diagnosis not present

## 2021-02-28 DIAGNOSIS — E669 Obesity, unspecified: Secondary | ICD-10-CM | POA: Diagnosis not present

## 2021-02-28 DIAGNOSIS — Z85828 Personal history of other malignant neoplasm of skin: Secondary | ICD-10-CM | POA: Diagnosis not present

## 2021-02-28 DIAGNOSIS — H8110 Benign paroxysmal vertigo, unspecified ear: Secondary | ICD-10-CM | POA: Diagnosis not present

## 2021-02-28 DIAGNOSIS — Z96653 Presence of artificial knee joint, bilateral: Secondary | ICD-10-CM | POA: Diagnosis not present

## 2021-02-28 DIAGNOSIS — M159 Polyosteoarthritis, unspecified: Secondary | ICD-10-CM | POA: Diagnosis not present

## 2021-02-28 DIAGNOSIS — Z6833 Body mass index (BMI) 33.0-33.9, adult: Secondary | ICD-10-CM | POA: Diagnosis not present

## 2021-03-02 DIAGNOSIS — K922 Gastrointestinal hemorrhage, unspecified: Secondary | ICD-10-CM | POA: Diagnosis not present

## 2021-03-02 DIAGNOSIS — Z85828 Personal history of other malignant neoplasm of skin: Secondary | ICD-10-CM | POA: Diagnosis not present

## 2021-03-02 DIAGNOSIS — K5730 Diverticulosis of large intestine without perforation or abscess without bleeding: Secondary | ICD-10-CM | POA: Diagnosis not present

## 2021-03-02 DIAGNOSIS — H409 Unspecified glaucoma: Secondary | ICD-10-CM | POA: Diagnosis not present

## 2021-03-02 DIAGNOSIS — F32A Depression, unspecified: Secondary | ICD-10-CM | POA: Diagnosis not present

## 2021-03-02 DIAGNOSIS — I700 Atherosclerosis of aorta: Secondary | ICD-10-CM | POA: Diagnosis not present

## 2021-03-02 DIAGNOSIS — Z9981 Dependence on supplemental oxygen: Secondary | ICD-10-CM | POA: Diagnosis not present

## 2021-03-02 DIAGNOSIS — M8580 Other specified disorders of bone density and structure, unspecified site: Secondary | ICD-10-CM | POA: Diagnosis not present

## 2021-03-02 DIAGNOSIS — F419 Anxiety disorder, unspecified: Secondary | ICD-10-CM | POA: Diagnosis not present

## 2021-03-02 DIAGNOSIS — E876 Hypokalemia: Secondary | ICD-10-CM | POA: Diagnosis not present

## 2021-03-02 DIAGNOSIS — E785 Hyperlipidemia, unspecified: Secondary | ICD-10-CM | POA: Diagnosis not present

## 2021-03-02 DIAGNOSIS — Z6833 Body mass index (BMI) 33.0-33.9, adult: Secondary | ICD-10-CM | POA: Diagnosis not present

## 2021-03-02 DIAGNOSIS — J449 Chronic obstructive pulmonary disease, unspecified: Secondary | ICD-10-CM | POA: Diagnosis not present

## 2021-03-02 DIAGNOSIS — E669 Obesity, unspecified: Secondary | ICD-10-CM | POA: Diagnosis not present

## 2021-03-02 DIAGNOSIS — R69 Illness, unspecified: Secondary | ICD-10-CM | POA: Diagnosis not present

## 2021-03-02 DIAGNOSIS — K219 Gastro-esophageal reflux disease without esophagitis: Secondary | ICD-10-CM | POA: Diagnosis not present

## 2021-03-02 DIAGNOSIS — S82841A Displaced bimalleolar fracture of right lower leg, initial encounter for closed fracture: Secondary | ICD-10-CM | POA: Diagnosis not present

## 2021-03-02 DIAGNOSIS — D649 Anemia, unspecified: Secondary | ICD-10-CM | POA: Diagnosis not present

## 2021-03-02 DIAGNOSIS — G2581 Restless legs syndrome: Secondary | ICD-10-CM | POA: Diagnosis not present

## 2021-03-02 DIAGNOSIS — H8110 Benign paroxysmal vertigo, unspecified ear: Secondary | ICD-10-CM | POA: Diagnosis not present

## 2021-03-02 DIAGNOSIS — E871 Hypo-osmolality and hyponatremia: Secondary | ICD-10-CM | POA: Diagnosis not present

## 2021-03-02 DIAGNOSIS — M159 Polyosteoarthritis, unspecified: Secondary | ICD-10-CM | POA: Diagnosis not present

## 2021-03-02 DIAGNOSIS — Z8701 Personal history of pneumonia (recurrent): Secondary | ICD-10-CM | POA: Diagnosis not present

## 2021-03-02 DIAGNOSIS — H3530 Unspecified macular degeneration: Secondary | ICD-10-CM | POA: Diagnosis not present

## 2021-03-02 DIAGNOSIS — Z96653 Presence of artificial knee joint, bilateral: Secondary | ICD-10-CM | POA: Diagnosis not present

## 2021-03-06 DIAGNOSIS — R0901 Asphyxia: Secondary | ICD-10-CM | POA: Diagnosis not present

## 2021-03-06 DIAGNOSIS — J449 Chronic obstructive pulmonary disease, unspecified: Secondary | ICD-10-CM | POA: Diagnosis not present

## 2021-03-06 DIAGNOSIS — R0609 Other forms of dyspnea: Secondary | ICD-10-CM | POA: Diagnosis not present

## 2021-03-07 DIAGNOSIS — H8110 Benign paroxysmal vertigo, unspecified ear: Secondary | ICD-10-CM | POA: Diagnosis not present

## 2021-03-07 DIAGNOSIS — R69 Illness, unspecified: Secondary | ICD-10-CM | POA: Diagnosis not present

## 2021-03-07 DIAGNOSIS — E785 Hyperlipidemia, unspecified: Secondary | ICD-10-CM | POA: Diagnosis not present

## 2021-03-07 DIAGNOSIS — Z6833 Body mass index (BMI) 33.0-33.9, adult: Secondary | ICD-10-CM | POA: Diagnosis not present

## 2021-03-07 DIAGNOSIS — E876 Hypokalemia: Secondary | ICD-10-CM | POA: Diagnosis not present

## 2021-03-07 DIAGNOSIS — J449 Chronic obstructive pulmonary disease, unspecified: Secondary | ICD-10-CM | POA: Diagnosis not present

## 2021-03-07 DIAGNOSIS — Z96653 Presence of artificial knee joint, bilateral: Secondary | ICD-10-CM | POA: Diagnosis not present

## 2021-03-07 DIAGNOSIS — D649 Anemia, unspecified: Secondary | ICD-10-CM | POA: Diagnosis not present

## 2021-03-07 DIAGNOSIS — K922 Gastrointestinal hemorrhage, unspecified: Secondary | ICD-10-CM | POA: Diagnosis not present

## 2021-03-07 DIAGNOSIS — Z8701 Personal history of pneumonia (recurrent): Secondary | ICD-10-CM | POA: Diagnosis not present

## 2021-03-07 DIAGNOSIS — G2581 Restless legs syndrome: Secondary | ICD-10-CM | POA: Diagnosis not present

## 2021-03-07 DIAGNOSIS — K5730 Diverticulosis of large intestine without perforation or abscess without bleeding: Secondary | ICD-10-CM | POA: Diagnosis not present

## 2021-03-07 DIAGNOSIS — H409 Unspecified glaucoma: Secondary | ICD-10-CM | POA: Diagnosis not present

## 2021-03-07 DIAGNOSIS — H3530 Unspecified macular degeneration: Secondary | ICD-10-CM | POA: Diagnosis not present

## 2021-03-07 DIAGNOSIS — M8580 Other specified disorders of bone density and structure, unspecified site: Secondary | ICD-10-CM | POA: Diagnosis not present

## 2021-03-07 DIAGNOSIS — S82841A Displaced bimalleolar fracture of right lower leg, initial encounter for closed fracture: Secondary | ICD-10-CM | POA: Diagnosis not present

## 2021-03-07 DIAGNOSIS — F419 Anxiety disorder, unspecified: Secondary | ICD-10-CM | POA: Diagnosis not present

## 2021-03-07 DIAGNOSIS — I700 Atherosclerosis of aorta: Secondary | ICD-10-CM | POA: Diagnosis not present

## 2021-03-07 DIAGNOSIS — E669 Obesity, unspecified: Secondary | ICD-10-CM | POA: Diagnosis not present

## 2021-03-07 DIAGNOSIS — E871 Hypo-osmolality and hyponatremia: Secondary | ICD-10-CM | POA: Diagnosis not present

## 2021-03-07 DIAGNOSIS — Z85828 Personal history of other malignant neoplasm of skin: Secondary | ICD-10-CM | POA: Diagnosis not present

## 2021-03-07 DIAGNOSIS — Z9981 Dependence on supplemental oxygen: Secondary | ICD-10-CM | POA: Diagnosis not present

## 2021-03-07 DIAGNOSIS — F32A Depression, unspecified: Secondary | ICD-10-CM | POA: Diagnosis not present

## 2021-03-07 DIAGNOSIS — M159 Polyosteoarthritis, unspecified: Secondary | ICD-10-CM | POA: Diagnosis not present

## 2021-03-07 DIAGNOSIS — K219 Gastro-esophageal reflux disease without esophagitis: Secondary | ICD-10-CM | POA: Diagnosis not present

## 2021-03-12 DIAGNOSIS — H409 Unspecified glaucoma: Secondary | ICD-10-CM | POA: Diagnosis not present

## 2021-03-12 DIAGNOSIS — F32A Depression, unspecified: Secondary | ICD-10-CM | POA: Diagnosis not present

## 2021-03-12 DIAGNOSIS — G2581 Restless legs syndrome: Secondary | ICD-10-CM | POA: Diagnosis not present

## 2021-03-12 DIAGNOSIS — E785 Hyperlipidemia, unspecified: Secondary | ICD-10-CM | POA: Diagnosis not present

## 2021-03-12 DIAGNOSIS — Z8701 Personal history of pneumonia (recurrent): Secondary | ICD-10-CM | POA: Diagnosis not present

## 2021-03-12 DIAGNOSIS — R69 Illness, unspecified: Secondary | ICD-10-CM | POA: Diagnosis not present

## 2021-03-12 DIAGNOSIS — E871 Hypo-osmolality and hyponatremia: Secondary | ICD-10-CM | POA: Diagnosis not present

## 2021-03-12 DIAGNOSIS — E876 Hypokalemia: Secondary | ICD-10-CM | POA: Diagnosis not present

## 2021-03-12 DIAGNOSIS — H8110 Benign paroxysmal vertigo, unspecified ear: Secondary | ICD-10-CM | POA: Diagnosis not present

## 2021-03-12 DIAGNOSIS — Z96653 Presence of artificial knee joint, bilateral: Secondary | ICD-10-CM | POA: Diagnosis not present

## 2021-03-12 DIAGNOSIS — M8580 Other specified disorders of bone density and structure, unspecified site: Secondary | ICD-10-CM | POA: Diagnosis not present

## 2021-03-12 DIAGNOSIS — Z9981 Dependence on supplemental oxygen: Secondary | ICD-10-CM | POA: Diagnosis not present

## 2021-03-12 DIAGNOSIS — E669 Obesity, unspecified: Secondary | ICD-10-CM | POA: Diagnosis not present

## 2021-03-12 DIAGNOSIS — F419 Anxiety disorder, unspecified: Secondary | ICD-10-CM | POA: Diagnosis not present

## 2021-03-12 DIAGNOSIS — J449 Chronic obstructive pulmonary disease, unspecified: Secondary | ICD-10-CM | POA: Diagnosis not present

## 2021-03-12 DIAGNOSIS — S82841A Displaced bimalleolar fracture of right lower leg, initial encounter for closed fracture: Secondary | ICD-10-CM | POA: Diagnosis not present

## 2021-03-12 DIAGNOSIS — M159 Polyosteoarthritis, unspecified: Secondary | ICD-10-CM | POA: Diagnosis not present

## 2021-03-12 DIAGNOSIS — H3530 Unspecified macular degeneration: Secondary | ICD-10-CM | POA: Diagnosis not present

## 2021-03-12 DIAGNOSIS — K5730 Diverticulosis of large intestine without perforation or abscess without bleeding: Secondary | ICD-10-CM | POA: Diagnosis not present

## 2021-03-12 DIAGNOSIS — Z85828 Personal history of other malignant neoplasm of skin: Secondary | ICD-10-CM | POA: Diagnosis not present

## 2021-03-12 DIAGNOSIS — Z6833 Body mass index (BMI) 33.0-33.9, adult: Secondary | ICD-10-CM | POA: Diagnosis not present

## 2021-03-12 DIAGNOSIS — K922 Gastrointestinal hemorrhage, unspecified: Secondary | ICD-10-CM | POA: Diagnosis not present

## 2021-03-12 DIAGNOSIS — I700 Atherosclerosis of aorta: Secondary | ICD-10-CM | POA: Diagnosis not present

## 2021-03-12 DIAGNOSIS — K219 Gastro-esophageal reflux disease without esophagitis: Secondary | ICD-10-CM | POA: Diagnosis not present

## 2021-03-12 DIAGNOSIS — D649 Anemia, unspecified: Secondary | ICD-10-CM | POA: Diagnosis not present

## 2021-03-13 DIAGNOSIS — R69 Illness, unspecified: Secondary | ICD-10-CM | POA: Diagnosis not present

## 2021-03-13 DIAGNOSIS — J449 Chronic obstructive pulmonary disease, unspecified: Secondary | ICD-10-CM | POA: Diagnosis not present

## 2021-03-13 DIAGNOSIS — R7301 Impaired fasting glucose: Secondary | ICD-10-CM | POA: Diagnosis not present

## 2021-03-13 DIAGNOSIS — H3530 Unspecified macular degeneration: Secondary | ICD-10-CM | POA: Diagnosis not present

## 2021-03-13 DIAGNOSIS — R531 Weakness: Secondary | ICD-10-CM | POA: Diagnosis not present

## 2021-03-13 DIAGNOSIS — Z7401 Bed confinement status: Secondary | ICD-10-CM | POA: Diagnosis not present

## 2021-03-13 DIAGNOSIS — Z743 Need for continuous supervision: Secondary | ICD-10-CM | POA: Diagnosis not present

## 2021-03-13 DIAGNOSIS — R2689 Other abnormalities of gait and mobility: Secondary | ICD-10-CM | POA: Diagnosis not present

## 2021-03-13 DIAGNOSIS — E785 Hyperlipidemia, unspecified: Secondary | ICD-10-CM | POA: Diagnosis not present

## 2021-03-13 DIAGNOSIS — K219 Gastro-esophageal reflux disease without esophagitis: Secondary | ICD-10-CM | POA: Diagnosis not present

## 2021-03-13 DIAGNOSIS — E559 Vitamin D deficiency, unspecified: Secondary | ICD-10-CM | POA: Diagnosis not present

## 2021-03-13 DIAGNOSIS — S82841D Displaced bimalleolar fracture of right lower leg, subsequent encounter for closed fracture with routine healing: Secondary | ICD-10-CM | POA: Diagnosis not present

## 2021-03-13 DIAGNOSIS — M810 Age-related osteoporosis without current pathological fracture: Secondary | ICD-10-CM | POA: Diagnosis not present

## 2021-03-13 DIAGNOSIS — J45909 Unspecified asthma, uncomplicated: Secondary | ICD-10-CM | POA: Diagnosis not present

## 2021-03-16 DIAGNOSIS — G2581 Restless legs syndrome: Secondary | ICD-10-CM | POA: Diagnosis not present

## 2021-03-16 DIAGNOSIS — H8110 Benign paroxysmal vertigo, unspecified ear: Secondary | ICD-10-CM | POA: Diagnosis not present

## 2021-03-16 DIAGNOSIS — Z6833 Body mass index (BMI) 33.0-33.9, adult: Secondary | ICD-10-CM | POA: Diagnosis not present

## 2021-03-16 DIAGNOSIS — M8580 Other specified disorders of bone density and structure, unspecified site: Secondary | ICD-10-CM | POA: Diagnosis not present

## 2021-03-16 DIAGNOSIS — F32A Depression, unspecified: Secondary | ICD-10-CM | POA: Diagnosis not present

## 2021-03-16 DIAGNOSIS — K5730 Diverticulosis of large intestine without perforation or abscess without bleeding: Secondary | ICD-10-CM | POA: Diagnosis not present

## 2021-03-16 DIAGNOSIS — E876 Hypokalemia: Secondary | ICD-10-CM | POA: Diagnosis not present

## 2021-03-16 DIAGNOSIS — D649 Anemia, unspecified: Secondary | ICD-10-CM | POA: Diagnosis not present

## 2021-03-16 DIAGNOSIS — I700 Atherosclerosis of aorta: Secondary | ICD-10-CM | POA: Diagnosis not present

## 2021-03-16 DIAGNOSIS — Z9981 Dependence on supplemental oxygen: Secondary | ICD-10-CM | POA: Diagnosis not present

## 2021-03-16 DIAGNOSIS — S82841A Displaced bimalleolar fracture of right lower leg, initial encounter for closed fracture: Secondary | ICD-10-CM | POA: Diagnosis not present

## 2021-03-16 DIAGNOSIS — M159 Polyosteoarthritis, unspecified: Secondary | ICD-10-CM | POA: Diagnosis not present

## 2021-03-16 DIAGNOSIS — H409 Unspecified glaucoma: Secondary | ICD-10-CM | POA: Diagnosis not present

## 2021-03-16 DIAGNOSIS — H3530 Unspecified macular degeneration: Secondary | ICD-10-CM | POA: Diagnosis not present

## 2021-03-16 DIAGNOSIS — E871 Hypo-osmolality and hyponatremia: Secondary | ICD-10-CM | POA: Diagnosis not present

## 2021-03-16 DIAGNOSIS — K922 Gastrointestinal hemorrhage, unspecified: Secondary | ICD-10-CM | POA: Diagnosis not present

## 2021-03-16 DIAGNOSIS — F419 Anxiety disorder, unspecified: Secondary | ICD-10-CM | POA: Diagnosis not present

## 2021-03-16 DIAGNOSIS — R69 Illness, unspecified: Secondary | ICD-10-CM | POA: Diagnosis not present

## 2021-03-16 DIAGNOSIS — E785 Hyperlipidemia, unspecified: Secondary | ICD-10-CM | POA: Diagnosis not present

## 2021-03-16 DIAGNOSIS — Z96653 Presence of artificial knee joint, bilateral: Secondary | ICD-10-CM | POA: Diagnosis not present

## 2021-03-16 DIAGNOSIS — E669 Obesity, unspecified: Secondary | ICD-10-CM | POA: Diagnosis not present

## 2021-03-16 DIAGNOSIS — J449 Chronic obstructive pulmonary disease, unspecified: Secondary | ICD-10-CM | POA: Diagnosis not present

## 2021-03-16 DIAGNOSIS — Z85828 Personal history of other malignant neoplasm of skin: Secondary | ICD-10-CM | POA: Diagnosis not present

## 2021-03-16 DIAGNOSIS — Z8701 Personal history of pneumonia (recurrent): Secondary | ICD-10-CM | POA: Diagnosis not present

## 2021-03-16 DIAGNOSIS — K219 Gastro-esophageal reflux disease without esophagitis: Secondary | ICD-10-CM | POA: Diagnosis not present

## 2021-03-19 DIAGNOSIS — F32A Depression, unspecified: Secondary | ICD-10-CM | POA: Diagnosis not present

## 2021-03-19 DIAGNOSIS — E669 Obesity, unspecified: Secondary | ICD-10-CM | POA: Diagnosis not present

## 2021-03-19 DIAGNOSIS — H8110 Benign paroxysmal vertigo, unspecified ear: Secondary | ICD-10-CM | POA: Diagnosis not present

## 2021-03-19 DIAGNOSIS — E785 Hyperlipidemia, unspecified: Secondary | ICD-10-CM | POA: Diagnosis not present

## 2021-03-19 DIAGNOSIS — G2581 Restless legs syndrome: Secondary | ICD-10-CM | POA: Diagnosis not present

## 2021-03-19 DIAGNOSIS — R69 Illness, unspecified: Secondary | ICD-10-CM | POA: Diagnosis not present

## 2021-03-19 DIAGNOSIS — S82841A Displaced bimalleolar fracture of right lower leg, initial encounter for closed fracture: Secondary | ICD-10-CM | POA: Diagnosis not present

## 2021-03-19 DIAGNOSIS — K219 Gastro-esophageal reflux disease without esophagitis: Secondary | ICD-10-CM | POA: Diagnosis not present

## 2021-03-19 DIAGNOSIS — M8580 Other specified disorders of bone density and structure, unspecified site: Secondary | ICD-10-CM | POA: Diagnosis not present

## 2021-03-19 DIAGNOSIS — Z8701 Personal history of pneumonia (recurrent): Secondary | ICD-10-CM | POA: Diagnosis not present

## 2021-03-19 DIAGNOSIS — Z9981 Dependence on supplemental oxygen: Secondary | ICD-10-CM | POA: Diagnosis not present

## 2021-03-19 DIAGNOSIS — I700 Atherosclerosis of aorta: Secondary | ICD-10-CM | POA: Diagnosis not present

## 2021-03-19 DIAGNOSIS — H409 Unspecified glaucoma: Secondary | ICD-10-CM | POA: Diagnosis not present

## 2021-03-19 DIAGNOSIS — E876 Hypokalemia: Secondary | ICD-10-CM | POA: Diagnosis not present

## 2021-03-19 DIAGNOSIS — J449 Chronic obstructive pulmonary disease, unspecified: Secondary | ICD-10-CM | POA: Diagnosis not present

## 2021-03-19 DIAGNOSIS — Z85828 Personal history of other malignant neoplasm of skin: Secondary | ICD-10-CM | POA: Diagnosis not present

## 2021-03-19 DIAGNOSIS — Z96653 Presence of artificial knee joint, bilateral: Secondary | ICD-10-CM | POA: Diagnosis not present

## 2021-03-19 DIAGNOSIS — Z6833 Body mass index (BMI) 33.0-33.9, adult: Secondary | ICD-10-CM | POA: Diagnosis not present

## 2021-03-19 DIAGNOSIS — F419 Anxiety disorder, unspecified: Secondary | ICD-10-CM | POA: Diagnosis not present

## 2021-03-19 DIAGNOSIS — K5730 Diverticulosis of large intestine without perforation or abscess without bleeding: Secondary | ICD-10-CM | POA: Diagnosis not present

## 2021-03-19 DIAGNOSIS — M159 Polyosteoarthritis, unspecified: Secondary | ICD-10-CM | POA: Diagnosis not present

## 2021-03-19 DIAGNOSIS — D649 Anemia, unspecified: Secondary | ICD-10-CM | POA: Diagnosis not present

## 2021-03-19 DIAGNOSIS — K922 Gastrointestinal hemorrhage, unspecified: Secondary | ICD-10-CM | POA: Diagnosis not present

## 2021-03-19 DIAGNOSIS — H3530 Unspecified macular degeneration: Secondary | ICD-10-CM | POA: Diagnosis not present

## 2021-03-19 DIAGNOSIS — E871 Hypo-osmolality and hyponatremia: Secondary | ICD-10-CM | POA: Diagnosis not present

## 2021-03-20 DIAGNOSIS — I700 Atherosclerosis of aorta: Secondary | ICD-10-CM | POA: Diagnosis not present

## 2021-03-20 DIAGNOSIS — R69 Illness, unspecified: Secondary | ICD-10-CM | POA: Diagnosis not present

## 2021-03-20 DIAGNOSIS — M159 Polyosteoarthritis, unspecified: Secondary | ICD-10-CM | POA: Diagnosis not present

## 2021-03-20 DIAGNOSIS — H8110 Benign paroxysmal vertigo, unspecified ear: Secondary | ICD-10-CM | POA: Diagnosis not present

## 2021-03-20 DIAGNOSIS — K5730 Diverticulosis of large intestine without perforation or abscess without bleeding: Secondary | ICD-10-CM | POA: Diagnosis not present

## 2021-03-20 DIAGNOSIS — Z96653 Presence of artificial knee joint, bilateral: Secondary | ICD-10-CM | POA: Diagnosis not present

## 2021-03-20 DIAGNOSIS — E669 Obesity, unspecified: Secondary | ICD-10-CM | POA: Diagnosis not present

## 2021-03-20 DIAGNOSIS — E785 Hyperlipidemia, unspecified: Secondary | ICD-10-CM | POA: Diagnosis not present

## 2021-03-20 DIAGNOSIS — H409 Unspecified glaucoma: Secondary | ICD-10-CM | POA: Diagnosis not present

## 2021-03-20 DIAGNOSIS — G2581 Restless legs syndrome: Secondary | ICD-10-CM | POA: Diagnosis not present

## 2021-03-20 DIAGNOSIS — F32A Depression, unspecified: Secondary | ICD-10-CM | POA: Diagnosis not present

## 2021-03-20 DIAGNOSIS — J449 Chronic obstructive pulmonary disease, unspecified: Secondary | ICD-10-CM | POA: Diagnosis not present

## 2021-03-20 DIAGNOSIS — S82841A Displaced bimalleolar fracture of right lower leg, initial encounter for closed fracture: Secondary | ICD-10-CM | POA: Diagnosis not present

## 2021-03-20 DIAGNOSIS — K922 Gastrointestinal hemorrhage, unspecified: Secondary | ICD-10-CM | POA: Diagnosis not present

## 2021-03-20 DIAGNOSIS — E876 Hypokalemia: Secondary | ICD-10-CM | POA: Diagnosis not present

## 2021-03-20 DIAGNOSIS — M8580 Other specified disorders of bone density and structure, unspecified site: Secondary | ICD-10-CM | POA: Diagnosis not present

## 2021-03-20 DIAGNOSIS — Z85828 Personal history of other malignant neoplasm of skin: Secondary | ICD-10-CM | POA: Diagnosis not present

## 2021-03-20 DIAGNOSIS — F419 Anxiety disorder, unspecified: Secondary | ICD-10-CM | POA: Diagnosis not present

## 2021-03-20 DIAGNOSIS — E871 Hypo-osmolality and hyponatremia: Secondary | ICD-10-CM | POA: Diagnosis not present

## 2021-03-20 DIAGNOSIS — K219 Gastro-esophageal reflux disease without esophagitis: Secondary | ICD-10-CM | POA: Diagnosis not present

## 2021-03-20 DIAGNOSIS — D649 Anemia, unspecified: Secondary | ICD-10-CM | POA: Diagnosis not present

## 2021-03-20 DIAGNOSIS — Z6833 Body mass index (BMI) 33.0-33.9, adult: Secondary | ICD-10-CM | POA: Diagnosis not present

## 2021-03-20 DIAGNOSIS — Z9981 Dependence on supplemental oxygen: Secondary | ICD-10-CM | POA: Diagnosis not present

## 2021-03-20 DIAGNOSIS — Z8701 Personal history of pneumonia (recurrent): Secondary | ICD-10-CM | POA: Diagnosis not present

## 2021-03-20 DIAGNOSIS — H3530 Unspecified macular degeneration: Secondary | ICD-10-CM | POA: Diagnosis not present

## 2021-03-21 DIAGNOSIS — Z8701 Personal history of pneumonia (recurrent): Secondary | ICD-10-CM | POA: Diagnosis not present

## 2021-03-21 DIAGNOSIS — Z6833 Body mass index (BMI) 33.0-33.9, adult: Secondary | ICD-10-CM | POA: Diagnosis not present

## 2021-03-21 DIAGNOSIS — E876 Hypokalemia: Secondary | ICD-10-CM | POA: Diagnosis not present

## 2021-03-21 DIAGNOSIS — Z9981 Dependence on supplemental oxygen: Secondary | ICD-10-CM | POA: Diagnosis not present

## 2021-03-21 DIAGNOSIS — Z85828 Personal history of other malignant neoplasm of skin: Secondary | ICD-10-CM | POA: Diagnosis not present

## 2021-03-21 DIAGNOSIS — M159 Polyosteoarthritis, unspecified: Secondary | ICD-10-CM | POA: Diagnosis not present

## 2021-03-21 DIAGNOSIS — K922 Gastrointestinal hemorrhage, unspecified: Secondary | ICD-10-CM | POA: Diagnosis not present

## 2021-03-21 DIAGNOSIS — G2581 Restless legs syndrome: Secondary | ICD-10-CM | POA: Diagnosis not present

## 2021-03-21 DIAGNOSIS — H8110 Benign paroxysmal vertigo, unspecified ear: Secondary | ICD-10-CM | POA: Diagnosis not present

## 2021-03-21 DIAGNOSIS — S82841A Displaced bimalleolar fracture of right lower leg, initial encounter for closed fracture: Secondary | ICD-10-CM | POA: Diagnosis not present

## 2021-03-21 DIAGNOSIS — E785 Hyperlipidemia, unspecified: Secondary | ICD-10-CM | POA: Diagnosis not present

## 2021-03-21 DIAGNOSIS — F419 Anxiety disorder, unspecified: Secondary | ICD-10-CM | POA: Diagnosis not present

## 2021-03-21 DIAGNOSIS — H3530 Unspecified macular degeneration: Secondary | ICD-10-CM | POA: Diagnosis not present

## 2021-03-21 DIAGNOSIS — E871 Hypo-osmolality and hyponatremia: Secondary | ICD-10-CM | POA: Diagnosis not present

## 2021-03-21 DIAGNOSIS — E669 Obesity, unspecified: Secondary | ICD-10-CM | POA: Diagnosis not present

## 2021-03-21 DIAGNOSIS — H409 Unspecified glaucoma: Secondary | ICD-10-CM | POA: Diagnosis not present

## 2021-03-21 DIAGNOSIS — F32A Depression, unspecified: Secondary | ICD-10-CM | POA: Diagnosis not present

## 2021-03-21 DIAGNOSIS — R69 Illness, unspecified: Secondary | ICD-10-CM | POA: Diagnosis not present

## 2021-03-21 DIAGNOSIS — J449 Chronic obstructive pulmonary disease, unspecified: Secondary | ICD-10-CM | POA: Diagnosis not present

## 2021-03-21 DIAGNOSIS — I700 Atherosclerosis of aorta: Secondary | ICD-10-CM | POA: Diagnosis not present

## 2021-03-21 DIAGNOSIS — M8580 Other specified disorders of bone density and structure, unspecified site: Secondary | ICD-10-CM | POA: Diagnosis not present

## 2021-03-21 DIAGNOSIS — Z96653 Presence of artificial knee joint, bilateral: Secondary | ICD-10-CM | POA: Diagnosis not present

## 2021-03-21 DIAGNOSIS — K219 Gastro-esophageal reflux disease without esophagitis: Secondary | ICD-10-CM | POA: Diagnosis not present

## 2021-03-21 DIAGNOSIS — K5730 Diverticulosis of large intestine without perforation or abscess without bleeding: Secondary | ICD-10-CM | POA: Diagnosis not present

## 2021-03-21 DIAGNOSIS — D649 Anemia, unspecified: Secondary | ICD-10-CM | POA: Diagnosis not present

## 2021-03-23 DIAGNOSIS — K219 Gastro-esophageal reflux disease without esophagitis: Secondary | ICD-10-CM | POA: Diagnosis not present

## 2021-03-23 DIAGNOSIS — E669 Obesity, unspecified: Secondary | ICD-10-CM | POA: Diagnosis not present

## 2021-03-23 DIAGNOSIS — E876 Hypokalemia: Secondary | ICD-10-CM | POA: Diagnosis not present

## 2021-03-23 DIAGNOSIS — R69 Illness, unspecified: Secondary | ICD-10-CM | POA: Diagnosis not present

## 2021-03-23 DIAGNOSIS — H8110 Benign paroxysmal vertigo, unspecified ear: Secondary | ICD-10-CM | POA: Diagnosis not present

## 2021-03-23 DIAGNOSIS — F419 Anxiety disorder, unspecified: Secondary | ICD-10-CM | POA: Diagnosis not present

## 2021-03-23 DIAGNOSIS — F32A Depression, unspecified: Secondary | ICD-10-CM | POA: Diagnosis not present

## 2021-03-23 DIAGNOSIS — K922 Gastrointestinal hemorrhage, unspecified: Secondary | ICD-10-CM | POA: Diagnosis not present

## 2021-03-23 DIAGNOSIS — Z9981 Dependence on supplemental oxygen: Secondary | ICD-10-CM | POA: Diagnosis not present

## 2021-03-23 DIAGNOSIS — M159 Polyosteoarthritis, unspecified: Secondary | ICD-10-CM | POA: Diagnosis not present

## 2021-03-23 DIAGNOSIS — E785 Hyperlipidemia, unspecified: Secondary | ICD-10-CM | POA: Diagnosis not present

## 2021-03-23 DIAGNOSIS — H3530 Unspecified macular degeneration: Secondary | ICD-10-CM | POA: Diagnosis not present

## 2021-03-23 DIAGNOSIS — G2581 Restless legs syndrome: Secondary | ICD-10-CM | POA: Diagnosis not present

## 2021-03-23 DIAGNOSIS — I700 Atherosclerosis of aorta: Secondary | ICD-10-CM | POA: Diagnosis not present

## 2021-03-23 DIAGNOSIS — S82841A Displaced bimalleolar fracture of right lower leg, initial encounter for closed fracture: Secondary | ICD-10-CM | POA: Diagnosis not present

## 2021-03-23 DIAGNOSIS — Z96653 Presence of artificial knee joint, bilateral: Secondary | ICD-10-CM | POA: Diagnosis not present

## 2021-03-23 DIAGNOSIS — H409 Unspecified glaucoma: Secondary | ICD-10-CM | POA: Diagnosis not present

## 2021-03-23 DIAGNOSIS — D649 Anemia, unspecified: Secondary | ICD-10-CM | POA: Diagnosis not present

## 2021-03-23 DIAGNOSIS — Z6833 Body mass index (BMI) 33.0-33.9, adult: Secondary | ICD-10-CM | POA: Diagnosis not present

## 2021-03-23 DIAGNOSIS — K5730 Diverticulosis of large intestine without perforation or abscess without bleeding: Secondary | ICD-10-CM | POA: Diagnosis not present

## 2021-03-23 DIAGNOSIS — E871 Hypo-osmolality and hyponatremia: Secondary | ICD-10-CM | POA: Diagnosis not present

## 2021-03-23 DIAGNOSIS — Z8701 Personal history of pneumonia (recurrent): Secondary | ICD-10-CM | POA: Diagnosis not present

## 2021-03-23 DIAGNOSIS — Z85828 Personal history of other malignant neoplasm of skin: Secondary | ICD-10-CM | POA: Diagnosis not present

## 2021-03-23 DIAGNOSIS — M8580 Other specified disorders of bone density and structure, unspecified site: Secondary | ICD-10-CM | POA: Diagnosis not present

## 2021-03-23 DIAGNOSIS — J449 Chronic obstructive pulmonary disease, unspecified: Secondary | ICD-10-CM | POA: Diagnosis not present

## 2021-03-26 DIAGNOSIS — M159 Polyosteoarthritis, unspecified: Secondary | ICD-10-CM | POA: Diagnosis not present

## 2021-03-26 DIAGNOSIS — F419 Anxiety disorder, unspecified: Secondary | ICD-10-CM | POA: Diagnosis not present

## 2021-03-26 DIAGNOSIS — J449 Chronic obstructive pulmonary disease, unspecified: Secondary | ICD-10-CM | POA: Diagnosis not present

## 2021-03-26 DIAGNOSIS — H3530 Unspecified macular degeneration: Secondary | ICD-10-CM | POA: Diagnosis not present

## 2021-03-26 DIAGNOSIS — D649 Anemia, unspecified: Secondary | ICD-10-CM | POA: Diagnosis not present

## 2021-03-26 DIAGNOSIS — I700 Atherosclerosis of aorta: Secondary | ICD-10-CM | POA: Diagnosis not present

## 2021-03-26 DIAGNOSIS — E669 Obesity, unspecified: Secondary | ICD-10-CM | POA: Diagnosis not present

## 2021-03-26 DIAGNOSIS — Z85828 Personal history of other malignant neoplasm of skin: Secondary | ICD-10-CM | POA: Diagnosis not present

## 2021-03-26 DIAGNOSIS — E876 Hypokalemia: Secondary | ICD-10-CM | POA: Diagnosis not present

## 2021-03-26 DIAGNOSIS — Z9981 Dependence on supplemental oxygen: Secondary | ICD-10-CM | POA: Diagnosis not present

## 2021-03-26 DIAGNOSIS — E785 Hyperlipidemia, unspecified: Secondary | ICD-10-CM | POA: Diagnosis not present

## 2021-03-26 DIAGNOSIS — M8580 Other specified disorders of bone density and structure, unspecified site: Secondary | ICD-10-CM | POA: Diagnosis not present

## 2021-03-26 DIAGNOSIS — K922 Gastrointestinal hemorrhage, unspecified: Secondary | ICD-10-CM | POA: Diagnosis not present

## 2021-03-26 DIAGNOSIS — Z8701 Personal history of pneumonia (recurrent): Secondary | ICD-10-CM | POA: Diagnosis not present

## 2021-03-26 DIAGNOSIS — K219 Gastro-esophageal reflux disease without esophagitis: Secondary | ICD-10-CM | POA: Diagnosis not present

## 2021-03-26 DIAGNOSIS — H409 Unspecified glaucoma: Secondary | ICD-10-CM | POA: Diagnosis not present

## 2021-03-26 DIAGNOSIS — R69 Illness, unspecified: Secondary | ICD-10-CM | POA: Diagnosis not present

## 2021-03-26 DIAGNOSIS — Z6833 Body mass index (BMI) 33.0-33.9, adult: Secondary | ICD-10-CM | POA: Diagnosis not present

## 2021-03-26 DIAGNOSIS — E871 Hypo-osmolality and hyponatremia: Secondary | ICD-10-CM | POA: Diagnosis not present

## 2021-03-26 DIAGNOSIS — Z96653 Presence of artificial knee joint, bilateral: Secondary | ICD-10-CM | POA: Diagnosis not present

## 2021-03-26 DIAGNOSIS — F32A Depression, unspecified: Secondary | ICD-10-CM | POA: Diagnosis not present

## 2021-03-26 DIAGNOSIS — G2581 Restless legs syndrome: Secondary | ICD-10-CM | POA: Diagnosis not present

## 2021-03-26 DIAGNOSIS — H8110 Benign paroxysmal vertigo, unspecified ear: Secondary | ICD-10-CM | POA: Diagnosis not present

## 2021-03-26 DIAGNOSIS — S82841A Displaced bimalleolar fracture of right lower leg, initial encounter for closed fracture: Secondary | ICD-10-CM | POA: Diagnosis not present

## 2021-03-26 DIAGNOSIS — K5730 Diverticulosis of large intestine without perforation or abscess without bleeding: Secondary | ICD-10-CM | POA: Diagnosis not present

## 2021-03-28 DIAGNOSIS — E669 Obesity, unspecified: Secondary | ICD-10-CM | POA: Diagnosis not present

## 2021-03-28 DIAGNOSIS — H8110 Benign paroxysmal vertigo, unspecified ear: Secondary | ICD-10-CM | POA: Diagnosis not present

## 2021-03-28 DIAGNOSIS — M8580 Other specified disorders of bone density and structure, unspecified site: Secondary | ICD-10-CM | POA: Diagnosis not present

## 2021-03-28 DIAGNOSIS — M159 Polyosteoarthritis, unspecified: Secondary | ICD-10-CM | POA: Diagnosis not present

## 2021-03-28 DIAGNOSIS — E785 Hyperlipidemia, unspecified: Secondary | ICD-10-CM | POA: Diagnosis not present

## 2021-03-28 DIAGNOSIS — Z6833 Body mass index (BMI) 33.0-33.9, adult: Secondary | ICD-10-CM | POA: Diagnosis not present

## 2021-03-28 DIAGNOSIS — E871 Hypo-osmolality and hyponatremia: Secondary | ICD-10-CM | POA: Diagnosis not present

## 2021-03-28 DIAGNOSIS — J449 Chronic obstructive pulmonary disease, unspecified: Secondary | ICD-10-CM | POA: Diagnosis not present

## 2021-03-28 DIAGNOSIS — K5730 Diverticulosis of large intestine without perforation or abscess without bleeding: Secondary | ICD-10-CM | POA: Diagnosis not present

## 2021-03-28 DIAGNOSIS — R69 Illness, unspecified: Secondary | ICD-10-CM | POA: Diagnosis not present

## 2021-03-28 DIAGNOSIS — K922 Gastrointestinal hemorrhage, unspecified: Secondary | ICD-10-CM | POA: Diagnosis not present

## 2021-03-28 DIAGNOSIS — E876 Hypokalemia: Secondary | ICD-10-CM | POA: Diagnosis not present

## 2021-03-28 DIAGNOSIS — F419 Anxiety disorder, unspecified: Secondary | ICD-10-CM | POA: Diagnosis not present

## 2021-03-28 DIAGNOSIS — S82841A Displaced bimalleolar fracture of right lower leg, initial encounter for closed fracture: Secondary | ICD-10-CM | POA: Diagnosis not present

## 2021-03-28 DIAGNOSIS — I700 Atherosclerosis of aorta: Secondary | ICD-10-CM | POA: Diagnosis not present

## 2021-03-28 DIAGNOSIS — D649 Anemia, unspecified: Secondary | ICD-10-CM | POA: Diagnosis not present

## 2021-03-28 DIAGNOSIS — Z9981 Dependence on supplemental oxygen: Secondary | ICD-10-CM | POA: Diagnosis not present

## 2021-03-28 DIAGNOSIS — F32A Depression, unspecified: Secondary | ICD-10-CM | POA: Diagnosis not present

## 2021-03-28 DIAGNOSIS — Z85828 Personal history of other malignant neoplasm of skin: Secondary | ICD-10-CM | POA: Diagnosis not present

## 2021-03-28 DIAGNOSIS — G2581 Restless legs syndrome: Secondary | ICD-10-CM | POA: Diagnosis not present

## 2021-03-28 DIAGNOSIS — H3530 Unspecified macular degeneration: Secondary | ICD-10-CM | POA: Diagnosis not present

## 2021-03-28 DIAGNOSIS — Z96653 Presence of artificial knee joint, bilateral: Secondary | ICD-10-CM | POA: Diagnosis not present

## 2021-03-28 DIAGNOSIS — K219 Gastro-esophageal reflux disease without esophagitis: Secondary | ICD-10-CM | POA: Diagnosis not present

## 2021-03-28 DIAGNOSIS — Z8701 Personal history of pneumonia (recurrent): Secondary | ICD-10-CM | POA: Diagnosis not present

## 2021-03-28 DIAGNOSIS — H409 Unspecified glaucoma: Secondary | ICD-10-CM | POA: Diagnosis not present

## 2021-03-30 DIAGNOSIS — E785 Hyperlipidemia, unspecified: Secondary | ICD-10-CM | POA: Diagnosis not present

## 2021-03-30 DIAGNOSIS — G2581 Restless legs syndrome: Secondary | ICD-10-CM | POA: Diagnosis not present

## 2021-03-30 DIAGNOSIS — M810 Age-related osteoporosis without current pathological fracture: Secondary | ICD-10-CM | POA: Diagnosis not present

## 2021-03-30 DIAGNOSIS — K219 Gastro-esophageal reflux disease without esophagitis: Secondary | ICD-10-CM | POA: Diagnosis not present

## 2021-04-02 DIAGNOSIS — M159 Polyosteoarthritis, unspecified: Secondary | ICD-10-CM | POA: Diagnosis not present

## 2021-04-02 DIAGNOSIS — M8580 Other specified disorders of bone density and structure, unspecified site: Secondary | ICD-10-CM | POA: Diagnosis not present

## 2021-04-02 DIAGNOSIS — S82841A Displaced bimalleolar fracture of right lower leg, initial encounter for closed fracture: Secondary | ICD-10-CM | POA: Diagnosis not present

## 2021-04-02 DIAGNOSIS — Z85828 Personal history of other malignant neoplasm of skin: Secondary | ICD-10-CM | POA: Diagnosis not present

## 2021-04-02 DIAGNOSIS — J449 Chronic obstructive pulmonary disease, unspecified: Secondary | ICD-10-CM | POA: Diagnosis not present

## 2021-04-02 DIAGNOSIS — Z96653 Presence of artificial knee joint, bilateral: Secondary | ICD-10-CM | POA: Diagnosis not present

## 2021-04-02 DIAGNOSIS — G2581 Restless legs syndrome: Secondary | ICD-10-CM | POA: Diagnosis not present

## 2021-04-02 DIAGNOSIS — Z9981 Dependence on supplemental oxygen: Secondary | ICD-10-CM | POA: Diagnosis not present

## 2021-04-02 DIAGNOSIS — H3530 Unspecified macular degeneration: Secondary | ICD-10-CM | POA: Diagnosis not present

## 2021-04-02 DIAGNOSIS — Z8701 Personal history of pneumonia (recurrent): Secondary | ICD-10-CM | POA: Diagnosis not present

## 2021-04-02 DIAGNOSIS — K5730 Diverticulosis of large intestine without perforation or abscess without bleeding: Secondary | ICD-10-CM | POA: Diagnosis not present

## 2021-04-02 DIAGNOSIS — E669 Obesity, unspecified: Secondary | ICD-10-CM | POA: Diagnosis not present

## 2021-04-02 DIAGNOSIS — H409 Unspecified glaucoma: Secondary | ICD-10-CM | POA: Diagnosis not present

## 2021-04-02 DIAGNOSIS — E876 Hypokalemia: Secondary | ICD-10-CM | POA: Diagnosis not present

## 2021-04-02 DIAGNOSIS — F419 Anxiety disorder, unspecified: Secondary | ICD-10-CM | POA: Diagnosis not present

## 2021-04-02 DIAGNOSIS — R69 Illness, unspecified: Secondary | ICD-10-CM | POA: Diagnosis not present

## 2021-04-02 DIAGNOSIS — E785 Hyperlipidemia, unspecified: Secondary | ICD-10-CM | POA: Diagnosis not present

## 2021-04-02 DIAGNOSIS — I700 Atherosclerosis of aorta: Secondary | ICD-10-CM | POA: Diagnosis not present

## 2021-04-02 DIAGNOSIS — K219 Gastro-esophageal reflux disease without esophagitis: Secondary | ICD-10-CM | POA: Diagnosis not present

## 2021-04-02 DIAGNOSIS — Z6833 Body mass index (BMI) 33.0-33.9, adult: Secondary | ICD-10-CM | POA: Diagnosis not present

## 2021-04-02 DIAGNOSIS — H8110 Benign paroxysmal vertigo, unspecified ear: Secondary | ICD-10-CM | POA: Diagnosis not present

## 2021-04-02 DIAGNOSIS — E871 Hypo-osmolality and hyponatremia: Secondary | ICD-10-CM | POA: Diagnosis not present

## 2021-04-02 DIAGNOSIS — K922 Gastrointestinal hemorrhage, unspecified: Secondary | ICD-10-CM | POA: Diagnosis not present

## 2021-04-02 DIAGNOSIS — F32A Depression, unspecified: Secondary | ICD-10-CM | POA: Diagnosis not present

## 2021-04-02 DIAGNOSIS — D649 Anemia, unspecified: Secondary | ICD-10-CM | POA: Diagnosis not present

## 2021-04-03 DIAGNOSIS — G2581 Restless legs syndrome: Secondary | ICD-10-CM | POA: Diagnosis not present

## 2021-04-03 DIAGNOSIS — Z9981 Dependence on supplemental oxygen: Secondary | ICD-10-CM | POA: Diagnosis not present

## 2021-04-03 DIAGNOSIS — E785 Hyperlipidemia, unspecified: Secondary | ICD-10-CM | POA: Diagnosis not present

## 2021-04-03 DIAGNOSIS — K219 Gastro-esophageal reflux disease without esophagitis: Secondary | ICD-10-CM | POA: Diagnosis not present

## 2021-04-03 DIAGNOSIS — M8580 Other specified disorders of bone density and structure, unspecified site: Secondary | ICD-10-CM | POA: Diagnosis not present

## 2021-04-03 DIAGNOSIS — H409 Unspecified glaucoma: Secondary | ICD-10-CM | POA: Diagnosis not present

## 2021-04-03 DIAGNOSIS — J449 Chronic obstructive pulmonary disease, unspecified: Secondary | ICD-10-CM | POA: Diagnosis not present

## 2021-04-03 DIAGNOSIS — E669 Obesity, unspecified: Secondary | ICD-10-CM | POA: Diagnosis not present

## 2021-04-03 DIAGNOSIS — K922 Gastrointestinal hemorrhage, unspecified: Secondary | ICD-10-CM | POA: Diagnosis not present

## 2021-04-03 DIAGNOSIS — F419 Anxiety disorder, unspecified: Secondary | ICD-10-CM | POA: Diagnosis not present

## 2021-04-03 DIAGNOSIS — H3530 Unspecified macular degeneration: Secondary | ICD-10-CM | POA: Diagnosis not present

## 2021-04-03 DIAGNOSIS — H8110 Benign paroxysmal vertigo, unspecified ear: Secondary | ICD-10-CM | POA: Diagnosis not present

## 2021-04-03 DIAGNOSIS — Z85828 Personal history of other malignant neoplasm of skin: Secondary | ICD-10-CM | POA: Diagnosis not present

## 2021-04-03 DIAGNOSIS — K5730 Diverticulosis of large intestine without perforation or abscess without bleeding: Secondary | ICD-10-CM | POA: Diagnosis not present

## 2021-04-03 DIAGNOSIS — E876 Hypokalemia: Secondary | ICD-10-CM | POA: Diagnosis not present

## 2021-04-03 DIAGNOSIS — I700 Atherosclerosis of aorta: Secondary | ICD-10-CM | POA: Diagnosis not present

## 2021-04-03 DIAGNOSIS — Z8701 Personal history of pneumonia (recurrent): Secondary | ICD-10-CM | POA: Diagnosis not present

## 2021-04-03 DIAGNOSIS — Z6833 Body mass index (BMI) 33.0-33.9, adult: Secondary | ICD-10-CM | POA: Diagnosis not present

## 2021-04-03 DIAGNOSIS — R69 Illness, unspecified: Secondary | ICD-10-CM | POA: Diagnosis not present

## 2021-04-03 DIAGNOSIS — D649 Anemia, unspecified: Secondary | ICD-10-CM | POA: Diagnosis not present

## 2021-04-03 DIAGNOSIS — F32A Depression, unspecified: Secondary | ICD-10-CM | POA: Diagnosis not present

## 2021-04-03 DIAGNOSIS — Z96653 Presence of artificial knee joint, bilateral: Secondary | ICD-10-CM | POA: Diagnosis not present

## 2021-04-03 DIAGNOSIS — S82841A Displaced bimalleolar fracture of right lower leg, initial encounter for closed fracture: Secondary | ICD-10-CM | POA: Diagnosis not present

## 2021-04-03 DIAGNOSIS — M159 Polyosteoarthritis, unspecified: Secondary | ICD-10-CM | POA: Diagnosis not present

## 2021-04-03 DIAGNOSIS — E871 Hypo-osmolality and hyponatremia: Secondary | ICD-10-CM | POA: Diagnosis not present

## 2021-04-04 DIAGNOSIS — Z85828 Personal history of other malignant neoplasm of skin: Secondary | ICD-10-CM | POA: Diagnosis not present

## 2021-04-04 DIAGNOSIS — R69 Illness, unspecified: Secondary | ICD-10-CM | POA: Diagnosis not present

## 2021-04-04 DIAGNOSIS — H3530 Unspecified macular degeneration: Secondary | ICD-10-CM | POA: Diagnosis not present

## 2021-04-04 DIAGNOSIS — Z6833 Body mass index (BMI) 33.0-33.9, adult: Secondary | ICD-10-CM | POA: Diagnosis not present

## 2021-04-04 DIAGNOSIS — H8110 Benign paroxysmal vertigo, unspecified ear: Secondary | ICD-10-CM | POA: Diagnosis not present

## 2021-04-04 DIAGNOSIS — F419 Anxiety disorder, unspecified: Secondary | ICD-10-CM | POA: Diagnosis not present

## 2021-04-04 DIAGNOSIS — Z8701 Personal history of pneumonia (recurrent): Secondary | ICD-10-CM | POA: Diagnosis not present

## 2021-04-04 DIAGNOSIS — E785 Hyperlipidemia, unspecified: Secondary | ICD-10-CM | POA: Diagnosis not present

## 2021-04-04 DIAGNOSIS — E871 Hypo-osmolality and hyponatremia: Secondary | ICD-10-CM | POA: Diagnosis not present

## 2021-04-04 DIAGNOSIS — Z9981 Dependence on supplemental oxygen: Secondary | ICD-10-CM | POA: Diagnosis not present

## 2021-04-04 DIAGNOSIS — I700 Atherosclerosis of aorta: Secondary | ICD-10-CM | POA: Diagnosis not present

## 2021-04-04 DIAGNOSIS — S82841A Displaced bimalleolar fracture of right lower leg, initial encounter for closed fracture: Secondary | ICD-10-CM | POA: Diagnosis not present

## 2021-04-04 DIAGNOSIS — H409 Unspecified glaucoma: Secondary | ICD-10-CM | POA: Diagnosis not present

## 2021-04-04 DIAGNOSIS — K5730 Diverticulosis of large intestine without perforation or abscess without bleeding: Secondary | ICD-10-CM | POA: Diagnosis not present

## 2021-04-04 DIAGNOSIS — Z96653 Presence of artificial knee joint, bilateral: Secondary | ICD-10-CM | POA: Diagnosis not present

## 2021-04-04 DIAGNOSIS — E669 Obesity, unspecified: Secondary | ICD-10-CM | POA: Diagnosis not present

## 2021-04-04 DIAGNOSIS — D649 Anemia, unspecified: Secondary | ICD-10-CM | POA: Diagnosis not present

## 2021-04-04 DIAGNOSIS — G2581 Restless legs syndrome: Secondary | ICD-10-CM | POA: Diagnosis not present

## 2021-04-04 DIAGNOSIS — J449 Chronic obstructive pulmonary disease, unspecified: Secondary | ICD-10-CM | POA: Diagnosis not present

## 2021-04-04 DIAGNOSIS — M159 Polyosteoarthritis, unspecified: Secondary | ICD-10-CM | POA: Diagnosis not present

## 2021-04-04 DIAGNOSIS — K219 Gastro-esophageal reflux disease without esophagitis: Secondary | ICD-10-CM | POA: Diagnosis not present

## 2021-04-04 DIAGNOSIS — M8580 Other specified disorders of bone density and structure, unspecified site: Secondary | ICD-10-CM | POA: Diagnosis not present

## 2021-04-04 DIAGNOSIS — E876 Hypokalemia: Secondary | ICD-10-CM | POA: Diagnosis not present

## 2021-04-04 DIAGNOSIS — K922 Gastrointestinal hemorrhage, unspecified: Secondary | ICD-10-CM | POA: Diagnosis not present

## 2021-04-04 DIAGNOSIS — F32A Depression, unspecified: Secondary | ICD-10-CM | POA: Diagnosis not present

## 2021-04-05 DIAGNOSIS — J449 Chronic obstructive pulmonary disease, unspecified: Secondary | ICD-10-CM | POA: Diagnosis not present

## 2021-04-05 DIAGNOSIS — R0901 Asphyxia: Secondary | ICD-10-CM | POA: Diagnosis not present

## 2021-04-05 DIAGNOSIS — R0609 Other forms of dyspnea: Secondary | ICD-10-CM | POA: Diagnosis not present

## 2021-04-09 DIAGNOSIS — M8580 Other specified disorders of bone density and structure, unspecified site: Secondary | ICD-10-CM | POA: Diagnosis not present

## 2021-04-09 DIAGNOSIS — E871 Hypo-osmolality and hyponatremia: Secondary | ICD-10-CM | POA: Diagnosis not present

## 2021-04-09 DIAGNOSIS — S82841A Displaced bimalleolar fracture of right lower leg, initial encounter for closed fracture: Secondary | ICD-10-CM | POA: Diagnosis not present

## 2021-04-09 DIAGNOSIS — E876 Hypokalemia: Secondary | ICD-10-CM | POA: Diagnosis not present

## 2021-04-09 DIAGNOSIS — Z6833 Body mass index (BMI) 33.0-33.9, adult: Secondary | ICD-10-CM | POA: Diagnosis not present

## 2021-04-09 DIAGNOSIS — H409 Unspecified glaucoma: Secondary | ICD-10-CM | POA: Diagnosis not present

## 2021-04-09 DIAGNOSIS — F419 Anxiety disorder, unspecified: Secondary | ICD-10-CM | POA: Diagnosis not present

## 2021-04-09 DIAGNOSIS — E669 Obesity, unspecified: Secondary | ICD-10-CM | POA: Diagnosis not present

## 2021-04-09 DIAGNOSIS — Z96653 Presence of artificial knee joint, bilateral: Secondary | ICD-10-CM | POA: Diagnosis not present

## 2021-04-09 DIAGNOSIS — K219 Gastro-esophageal reflux disease without esophagitis: Secondary | ICD-10-CM | POA: Diagnosis not present

## 2021-04-09 DIAGNOSIS — I700 Atherosclerosis of aorta: Secondary | ICD-10-CM | POA: Diagnosis not present

## 2021-04-09 DIAGNOSIS — K5730 Diverticulosis of large intestine without perforation or abscess without bleeding: Secondary | ICD-10-CM | POA: Diagnosis not present

## 2021-04-09 DIAGNOSIS — F32A Depression, unspecified: Secondary | ICD-10-CM | POA: Diagnosis not present

## 2021-04-09 DIAGNOSIS — R69 Illness, unspecified: Secondary | ICD-10-CM | POA: Diagnosis not present

## 2021-04-09 DIAGNOSIS — Z9981 Dependence on supplemental oxygen: Secondary | ICD-10-CM | POA: Diagnosis not present

## 2021-04-09 DIAGNOSIS — Z8701 Personal history of pneumonia (recurrent): Secondary | ICD-10-CM | POA: Diagnosis not present

## 2021-04-09 DIAGNOSIS — Z85828 Personal history of other malignant neoplasm of skin: Secondary | ICD-10-CM | POA: Diagnosis not present

## 2021-04-09 DIAGNOSIS — E785 Hyperlipidemia, unspecified: Secondary | ICD-10-CM | POA: Diagnosis not present

## 2021-04-09 DIAGNOSIS — D649 Anemia, unspecified: Secondary | ICD-10-CM | POA: Diagnosis not present

## 2021-04-09 DIAGNOSIS — K922 Gastrointestinal hemorrhage, unspecified: Secondary | ICD-10-CM | POA: Diagnosis not present

## 2021-04-09 DIAGNOSIS — M159 Polyosteoarthritis, unspecified: Secondary | ICD-10-CM | POA: Diagnosis not present

## 2021-04-09 DIAGNOSIS — G2581 Restless legs syndrome: Secondary | ICD-10-CM | POA: Diagnosis not present

## 2021-04-09 DIAGNOSIS — H3530 Unspecified macular degeneration: Secondary | ICD-10-CM | POA: Diagnosis not present

## 2021-04-09 DIAGNOSIS — J449 Chronic obstructive pulmonary disease, unspecified: Secondary | ICD-10-CM | POA: Diagnosis not present

## 2021-04-09 DIAGNOSIS — H8110 Benign paroxysmal vertigo, unspecified ear: Secondary | ICD-10-CM | POA: Diagnosis not present

## 2021-04-18 DIAGNOSIS — H401132 Primary open-angle glaucoma, bilateral, moderate stage: Secondary | ICD-10-CM | POA: Diagnosis not present

## 2021-04-18 DIAGNOSIS — H18593 Other hereditary corneal dystrophies, bilateral: Secondary | ICD-10-CM | POA: Diagnosis not present

## 2021-04-19 DIAGNOSIS — E876 Hypokalemia: Secondary | ICD-10-CM | POA: Diagnosis not present

## 2021-04-19 DIAGNOSIS — Z96653 Presence of artificial knee joint, bilateral: Secondary | ICD-10-CM | POA: Diagnosis not present

## 2021-04-19 DIAGNOSIS — F419 Anxiety disorder, unspecified: Secondary | ICD-10-CM | POA: Diagnosis not present

## 2021-04-19 DIAGNOSIS — M8580 Other specified disorders of bone density and structure, unspecified site: Secondary | ICD-10-CM | POA: Diagnosis not present

## 2021-04-19 DIAGNOSIS — M159 Polyosteoarthritis, unspecified: Secondary | ICD-10-CM | POA: Diagnosis not present

## 2021-04-19 DIAGNOSIS — Z6833 Body mass index (BMI) 33.0-33.9, adult: Secondary | ICD-10-CM | POA: Diagnosis not present

## 2021-04-19 DIAGNOSIS — G2581 Restless legs syndrome: Secondary | ICD-10-CM | POA: Diagnosis not present

## 2021-04-19 DIAGNOSIS — J449 Chronic obstructive pulmonary disease, unspecified: Secondary | ICD-10-CM | POA: Diagnosis not present

## 2021-04-19 DIAGNOSIS — I700 Atherosclerosis of aorta: Secondary | ICD-10-CM | POA: Diagnosis not present

## 2021-04-19 DIAGNOSIS — Z9981 Dependence on supplemental oxygen: Secondary | ICD-10-CM | POA: Diagnosis not present

## 2021-04-19 DIAGNOSIS — E871 Hypo-osmolality and hyponatremia: Secondary | ICD-10-CM | POA: Diagnosis not present

## 2021-04-19 DIAGNOSIS — S82841A Displaced bimalleolar fracture of right lower leg, initial encounter for closed fracture: Secondary | ICD-10-CM | POA: Diagnosis not present

## 2021-04-19 DIAGNOSIS — E669 Obesity, unspecified: Secondary | ICD-10-CM | POA: Diagnosis not present

## 2021-04-19 DIAGNOSIS — D649 Anemia, unspecified: Secondary | ICD-10-CM | POA: Diagnosis not present

## 2021-04-19 DIAGNOSIS — H3530 Unspecified macular degeneration: Secondary | ICD-10-CM | POA: Diagnosis not present

## 2021-04-19 DIAGNOSIS — H8110 Benign paroxysmal vertigo, unspecified ear: Secondary | ICD-10-CM | POA: Diagnosis not present

## 2021-04-19 DIAGNOSIS — H409 Unspecified glaucoma: Secondary | ICD-10-CM | POA: Diagnosis not present

## 2021-04-19 DIAGNOSIS — K5730 Diverticulosis of large intestine without perforation or abscess without bleeding: Secondary | ICD-10-CM | POA: Diagnosis not present

## 2021-04-19 DIAGNOSIS — E785 Hyperlipidemia, unspecified: Secondary | ICD-10-CM | POA: Diagnosis not present

## 2021-04-19 DIAGNOSIS — K219 Gastro-esophageal reflux disease without esophagitis: Secondary | ICD-10-CM | POA: Diagnosis not present

## 2021-04-19 DIAGNOSIS — R69 Illness, unspecified: Secondary | ICD-10-CM | POA: Diagnosis not present

## 2021-04-19 DIAGNOSIS — Z85828 Personal history of other malignant neoplasm of skin: Secondary | ICD-10-CM | POA: Diagnosis not present

## 2021-04-19 DIAGNOSIS — Z8701 Personal history of pneumonia (recurrent): Secondary | ICD-10-CM | POA: Diagnosis not present

## 2021-04-19 DIAGNOSIS — F32A Depression, unspecified: Secondary | ICD-10-CM | POA: Diagnosis not present

## 2021-04-19 DIAGNOSIS — K922 Gastrointestinal hemorrhage, unspecified: Secondary | ICD-10-CM | POA: Diagnosis not present

## 2021-04-20 DIAGNOSIS — E876 Hypokalemia: Secondary | ICD-10-CM | POA: Diagnosis not present

## 2021-04-20 DIAGNOSIS — M159 Polyosteoarthritis, unspecified: Secondary | ICD-10-CM | POA: Diagnosis not present

## 2021-04-20 DIAGNOSIS — Z8701 Personal history of pneumonia (recurrent): Secondary | ICD-10-CM | POA: Diagnosis not present

## 2021-04-20 DIAGNOSIS — F419 Anxiety disorder, unspecified: Secondary | ICD-10-CM | POA: Diagnosis not present

## 2021-04-20 DIAGNOSIS — Z96653 Presence of artificial knee joint, bilateral: Secondary | ICD-10-CM | POA: Diagnosis not present

## 2021-04-20 DIAGNOSIS — Z9981 Dependence on supplemental oxygen: Secondary | ICD-10-CM | POA: Diagnosis not present

## 2021-04-20 DIAGNOSIS — Z85828 Personal history of other malignant neoplasm of skin: Secondary | ICD-10-CM | POA: Diagnosis not present

## 2021-04-20 DIAGNOSIS — S82841A Displaced bimalleolar fracture of right lower leg, initial encounter for closed fracture: Secondary | ICD-10-CM | POA: Diagnosis not present

## 2021-04-20 DIAGNOSIS — G2581 Restless legs syndrome: Secondary | ICD-10-CM | POA: Diagnosis not present

## 2021-04-20 DIAGNOSIS — K219 Gastro-esophageal reflux disease without esophagitis: Secondary | ICD-10-CM | POA: Diagnosis not present

## 2021-04-20 DIAGNOSIS — E785 Hyperlipidemia, unspecified: Secondary | ICD-10-CM | POA: Diagnosis not present

## 2021-04-20 DIAGNOSIS — K5730 Diverticulosis of large intestine without perforation or abscess without bleeding: Secondary | ICD-10-CM | POA: Diagnosis not present

## 2021-04-20 DIAGNOSIS — M8580 Other specified disorders of bone density and structure, unspecified site: Secondary | ICD-10-CM | POA: Diagnosis not present

## 2021-04-20 DIAGNOSIS — J449 Chronic obstructive pulmonary disease, unspecified: Secondary | ICD-10-CM | POA: Diagnosis not present

## 2021-04-20 DIAGNOSIS — E669 Obesity, unspecified: Secondary | ICD-10-CM | POA: Diagnosis not present

## 2021-04-20 DIAGNOSIS — H3530 Unspecified macular degeneration: Secondary | ICD-10-CM | POA: Diagnosis not present

## 2021-04-20 DIAGNOSIS — D649 Anemia, unspecified: Secondary | ICD-10-CM | POA: Diagnosis not present

## 2021-04-20 DIAGNOSIS — E871 Hypo-osmolality and hyponatremia: Secondary | ICD-10-CM | POA: Diagnosis not present

## 2021-04-20 DIAGNOSIS — Z6833 Body mass index (BMI) 33.0-33.9, adult: Secondary | ICD-10-CM | POA: Diagnosis not present

## 2021-04-20 DIAGNOSIS — I700 Atherosclerosis of aorta: Secondary | ICD-10-CM | POA: Diagnosis not present

## 2021-04-20 DIAGNOSIS — H8110 Benign paroxysmal vertigo, unspecified ear: Secondary | ICD-10-CM | POA: Diagnosis not present

## 2021-04-20 DIAGNOSIS — R69 Illness, unspecified: Secondary | ICD-10-CM | POA: Diagnosis not present

## 2021-04-20 DIAGNOSIS — K922 Gastrointestinal hemorrhage, unspecified: Secondary | ICD-10-CM | POA: Diagnosis not present

## 2021-04-20 DIAGNOSIS — H409 Unspecified glaucoma: Secondary | ICD-10-CM | POA: Diagnosis not present

## 2021-04-20 DIAGNOSIS — F32A Depression, unspecified: Secondary | ICD-10-CM | POA: Diagnosis not present

## 2021-04-24 DIAGNOSIS — E876 Hypokalemia: Secondary | ICD-10-CM | POA: Diagnosis not present

## 2021-04-24 DIAGNOSIS — R69 Illness, unspecified: Secondary | ICD-10-CM | POA: Diagnosis not present

## 2021-04-24 DIAGNOSIS — D649 Anemia, unspecified: Secondary | ICD-10-CM | POA: Diagnosis not present

## 2021-04-24 DIAGNOSIS — H3530 Unspecified macular degeneration: Secondary | ICD-10-CM | POA: Diagnosis not present

## 2021-04-24 DIAGNOSIS — M159 Polyosteoarthritis, unspecified: Secondary | ICD-10-CM | POA: Diagnosis not present

## 2021-04-24 DIAGNOSIS — G2581 Restless legs syndrome: Secondary | ICD-10-CM | POA: Diagnosis not present

## 2021-04-24 DIAGNOSIS — E871 Hypo-osmolality and hyponatremia: Secondary | ICD-10-CM | POA: Diagnosis not present

## 2021-04-24 DIAGNOSIS — H409 Unspecified glaucoma: Secondary | ICD-10-CM | POA: Diagnosis not present

## 2021-04-24 DIAGNOSIS — K922 Gastrointestinal hemorrhage, unspecified: Secondary | ICD-10-CM | POA: Diagnosis not present

## 2021-04-24 DIAGNOSIS — J449 Chronic obstructive pulmonary disease, unspecified: Secondary | ICD-10-CM | POA: Diagnosis not present

## 2021-04-25 DIAGNOSIS — H3530 Unspecified macular degeneration: Secondary | ICD-10-CM | POA: Diagnosis not present

## 2021-04-25 DIAGNOSIS — H409 Unspecified glaucoma: Secondary | ICD-10-CM | POA: Diagnosis not present

## 2021-04-25 DIAGNOSIS — E871 Hypo-osmolality and hyponatremia: Secondary | ICD-10-CM | POA: Diagnosis not present

## 2021-04-25 DIAGNOSIS — M159 Polyosteoarthritis, unspecified: Secondary | ICD-10-CM | POA: Diagnosis not present

## 2021-04-25 DIAGNOSIS — K922 Gastrointestinal hemorrhage, unspecified: Secondary | ICD-10-CM | POA: Diagnosis not present

## 2021-04-25 DIAGNOSIS — R69 Illness, unspecified: Secondary | ICD-10-CM | POA: Diagnosis not present

## 2021-04-25 DIAGNOSIS — D649 Anemia, unspecified: Secondary | ICD-10-CM | POA: Diagnosis not present

## 2021-04-25 DIAGNOSIS — J449 Chronic obstructive pulmonary disease, unspecified: Secondary | ICD-10-CM | POA: Diagnosis not present

## 2021-04-25 DIAGNOSIS — E876 Hypokalemia: Secondary | ICD-10-CM | POA: Diagnosis not present

## 2021-04-25 DIAGNOSIS — G2581 Restless legs syndrome: Secondary | ICD-10-CM | POA: Diagnosis not present

## 2021-04-30 DIAGNOSIS — M159 Polyosteoarthritis, unspecified: Secondary | ICD-10-CM | POA: Diagnosis not present

## 2021-04-30 DIAGNOSIS — J449 Chronic obstructive pulmonary disease, unspecified: Secondary | ICD-10-CM | POA: Diagnosis not present

## 2021-04-30 DIAGNOSIS — G2581 Restless legs syndrome: Secondary | ICD-10-CM | POA: Diagnosis not present

## 2021-04-30 DIAGNOSIS — H409 Unspecified glaucoma: Secondary | ICD-10-CM | POA: Diagnosis not present

## 2021-04-30 DIAGNOSIS — H3530 Unspecified macular degeneration: Secondary | ICD-10-CM | POA: Diagnosis not present

## 2021-04-30 DIAGNOSIS — R69 Illness, unspecified: Secondary | ICD-10-CM | POA: Diagnosis not present

## 2021-04-30 DIAGNOSIS — S82841D Displaced bimalleolar fracture of right lower leg, subsequent encounter for closed fracture with routine healing: Secondary | ICD-10-CM | POA: Diagnosis not present

## 2021-04-30 DIAGNOSIS — K922 Gastrointestinal hemorrhage, unspecified: Secondary | ICD-10-CM | POA: Diagnosis not present

## 2021-04-30 DIAGNOSIS — E871 Hypo-osmolality and hyponatremia: Secondary | ICD-10-CM | POA: Diagnosis not present

## 2021-04-30 DIAGNOSIS — E876 Hypokalemia: Secondary | ICD-10-CM | POA: Diagnosis not present

## 2021-04-30 DIAGNOSIS — D649 Anemia, unspecified: Secondary | ICD-10-CM | POA: Diagnosis not present

## 2021-05-01 ENCOUNTER — Inpatient Hospital Stay (HOSPITAL_COMMUNITY)
Admission: EM | Admit: 2021-05-01 | Discharge: 2021-05-10 | DRG: 377 | Disposition: A | Discharge status: Home Health | Payer: Medicare HMO | Attending: Internal Medicine | Admitting: Internal Medicine

## 2021-05-01 ENCOUNTER — Other Ambulatory Visit: Payer: Self-pay

## 2021-05-01 DIAGNOSIS — Z885 Allergy status to narcotic agent status: Secondary | ICD-10-CM

## 2021-05-01 DIAGNOSIS — Z79899 Other long term (current) drug therapy: Secondary | ICD-10-CM

## 2021-05-01 DIAGNOSIS — M19012 Primary osteoarthritis, left shoulder: Secondary | ICD-10-CM | POA: Diagnosis present

## 2021-05-01 DIAGNOSIS — E669 Obesity, unspecified: Secondary | ICD-10-CM | POA: Diagnosis present

## 2021-05-01 DIAGNOSIS — Z9981 Dependence on supplemental oxygen: Secondary | ICD-10-CM

## 2021-05-01 DIAGNOSIS — Z972 Presence of dental prosthetic device (complete) (partial): Secondary | ICD-10-CM

## 2021-05-01 DIAGNOSIS — Z886 Allergy status to analgesic agent status: Secondary | ICD-10-CM

## 2021-05-01 DIAGNOSIS — J690 Pneumonitis due to inhalation of food and vomit: Secondary | ICD-10-CM | POA: Diagnosis not present

## 2021-05-01 DIAGNOSIS — J449 Chronic obstructive pulmonary disease, unspecified: Secondary | ICD-10-CM | POA: Diagnosis present

## 2021-05-01 DIAGNOSIS — G9341 Metabolic encephalopathy: Secondary | ICD-10-CM | POA: Diagnosis not present

## 2021-05-01 DIAGNOSIS — Z6830 Body mass index (BMI) 30.0-30.9, adult: Secondary | ICD-10-CM

## 2021-05-01 DIAGNOSIS — Z9851 Tubal ligation status: Secondary | ICD-10-CM

## 2021-05-01 DIAGNOSIS — Z881 Allergy status to other antibiotic agents status: Secondary | ICD-10-CM

## 2021-05-01 DIAGNOSIS — K5900 Constipation, unspecified: Secondary | ICD-10-CM | POA: Diagnosis not present

## 2021-05-01 DIAGNOSIS — J9601 Acute respiratory failure with hypoxia: Secondary | ICD-10-CM

## 2021-05-01 DIAGNOSIS — F32A Depression, unspecified: Secondary | ICD-10-CM | POA: Diagnosis present

## 2021-05-01 DIAGNOSIS — R1310 Dysphagia, unspecified: Secondary | ICD-10-CM

## 2021-05-01 DIAGNOSIS — J9622 Acute and chronic respiratory failure with hypercapnia: Secondary | ICD-10-CM | POA: Diagnosis present

## 2021-05-01 DIAGNOSIS — B3781 Candidal esophagitis: Secondary | ICD-10-CM | POA: Diagnosis present

## 2021-05-01 DIAGNOSIS — Z85828 Personal history of other malignant neoplasm of skin: Secondary | ICD-10-CM

## 2021-05-01 DIAGNOSIS — R911 Solitary pulmonary nodule: Secondary | ICD-10-CM | POA: Diagnosis present

## 2021-05-01 DIAGNOSIS — J441 Chronic obstructive pulmonary disease with (acute) exacerbation: Secondary | ICD-10-CM | POA: Diagnosis present

## 2021-05-01 DIAGNOSIS — M19022 Primary osteoarthritis, left elbow: Secondary | ICD-10-CM | POA: Diagnosis present

## 2021-05-01 DIAGNOSIS — G2581 Restless legs syndrome: Secondary | ICD-10-CM | POA: Diagnosis present

## 2021-05-01 DIAGNOSIS — J440 Chronic obstructive pulmonary disease with acute lower respiratory infection: Secondary | ICD-10-CM | POA: Diagnosis present

## 2021-05-01 DIAGNOSIS — M19011 Primary osteoarthritis, right shoulder: Secondary | ICD-10-CM | POA: Diagnosis present

## 2021-05-01 DIAGNOSIS — K922 Gastrointestinal hemorrhage, unspecified: Secondary | ICD-10-CM | POA: Diagnosis not present

## 2021-05-01 DIAGNOSIS — D509 Iron deficiency anemia, unspecified: Secondary | ICD-10-CM | POA: Diagnosis present

## 2021-05-01 DIAGNOSIS — Z87891 Personal history of nicotine dependence: Secondary | ICD-10-CM

## 2021-05-01 DIAGNOSIS — M47812 Spondylosis without myelopathy or radiculopathy, cervical region: Secondary | ICD-10-CM | POA: Diagnosis present

## 2021-05-01 DIAGNOSIS — K222 Esophageal obstruction: Secondary | ICD-10-CM | POA: Diagnosis present

## 2021-05-01 DIAGNOSIS — F419 Anxiety disorder, unspecified: Secondary | ICD-10-CM | POA: Diagnosis present

## 2021-05-01 DIAGNOSIS — Z888 Allergy status to other drugs, medicaments and biological substances status: Secondary | ICD-10-CM

## 2021-05-01 DIAGNOSIS — K219 Gastro-esophageal reflux disease without esophagitis: Secondary | ICD-10-CM | POA: Diagnosis not present

## 2021-05-01 DIAGNOSIS — J9621 Acute and chronic respiratory failure with hypoxia: Secondary | ICD-10-CM | POA: Diagnosis present

## 2021-05-01 DIAGNOSIS — R0902 Hypoxemia: Secondary | ICD-10-CM

## 2021-05-01 DIAGNOSIS — Z20822 Contact with and (suspected) exposure to covid-19: Secondary | ICD-10-CM | POA: Diagnosis present

## 2021-05-01 DIAGNOSIS — K625 Hemorrhage of anus and rectum: Secondary | ICD-10-CM | POA: Diagnosis not present

## 2021-05-01 DIAGNOSIS — K224 Dyskinesia of esophagus: Secondary | ICD-10-CM | POA: Diagnosis present

## 2021-05-01 DIAGNOSIS — Z8701 Personal history of pneumonia (recurrent): Secondary | ICD-10-CM

## 2021-05-01 DIAGNOSIS — M19021 Primary osteoarthritis, right elbow: Secondary | ICD-10-CM | POA: Diagnosis present

## 2021-05-01 DIAGNOSIS — R000 Tachycardia, unspecified: Secondary | ICD-10-CM | POA: Diagnosis not present

## 2021-05-01 DIAGNOSIS — E860 Dehydration: Secondary | ICD-10-CM | POA: Diagnosis not present

## 2021-05-01 DIAGNOSIS — E785 Hyperlipidemia, unspecified: Secondary | ICD-10-CM | POA: Diagnosis present

## 2021-05-01 DIAGNOSIS — M8580 Other specified disorders of bone density and structure, unspecified site: Secondary | ICD-10-CM | POA: Diagnosis present

## 2021-05-01 DIAGNOSIS — I272 Pulmonary hypertension, unspecified: Secondary | ICD-10-CM | POA: Diagnosis present

## 2021-05-01 DIAGNOSIS — M179 Osteoarthritis of knee, unspecified: Secondary | ICD-10-CM | POA: Diagnosis present

## 2021-05-01 DIAGNOSIS — D62 Acute posthemorrhagic anemia: Secondary | ICD-10-CM | POA: Diagnosis not present

## 2021-05-01 DIAGNOSIS — H409 Unspecified glaucoma: Secondary | ICD-10-CM | POA: Diagnosis present

## 2021-05-01 DIAGNOSIS — H3530 Unspecified macular degeneration: Secondary | ICD-10-CM | POA: Diagnosis present

## 2021-05-01 DIAGNOSIS — J302 Other seasonal allergic rhinitis: Secondary | ICD-10-CM | POA: Diagnosis present

## 2021-05-01 DIAGNOSIS — K5791 Diverticulosis of intestine, part unspecified, without perforation or abscess with bleeding: Secondary | ICD-10-CM | POA: Diagnosis not present

## 2021-05-01 DIAGNOSIS — I10 Essential (primary) hypertension: Secondary | ICD-10-CM | POA: Diagnosis present

## 2021-05-01 DIAGNOSIS — K317 Polyp of stomach and duodenum: Secondary | ICD-10-CM | POA: Diagnosis present

## 2021-05-01 LAB — CBC
HCT: 19.5 % — ABNORMAL LOW (ref 36.0–46.0)
Hemoglobin: 5.8 g/dL — CL (ref 12.0–15.0)
MCH: 23.2 pg — ABNORMAL LOW (ref 26.0–34.0)
MCHC: 29.7 g/dL — ABNORMAL LOW (ref 30.0–36.0)
MCV: 78 fL — ABNORMAL LOW (ref 80.0–100.0)
Platelets: 216 10*3/uL (ref 150–400)
RBC: 2.5 MIL/uL — ABNORMAL LOW (ref 3.87–5.11)
RDW: 19 % — ABNORMAL HIGH (ref 11.5–15.5)
WBC: 7.4 10*3/uL (ref 4.0–10.5)
nRBC: 0 % (ref 0.0–0.2)

## 2021-05-01 LAB — COMPREHENSIVE METABOLIC PANEL
ALT: 13 U/L (ref 0–44)
AST: 20 U/L (ref 15–41)
Albumin: 3.4 g/dL — ABNORMAL LOW (ref 3.5–5.0)
Alkaline Phosphatase: 62 U/L (ref 38–126)
Anion gap: 7 (ref 5–15)
BUN: 11 mg/dL (ref 8–23)
CO2: 25 mmol/L (ref 22–32)
Calcium: 8.7 mg/dL — ABNORMAL LOW (ref 8.9–10.3)
Chloride: 102 mmol/L (ref 98–111)
Creatinine, Ser: 0.46 mg/dL (ref 0.44–1.00)
GFR, Estimated: >60 mL/min (ref >60)
Glucose, Bld: 137 mg/dL — ABNORMAL HIGH (ref 70–99)
Potassium: 4.1 mmol/L (ref 3.5–5.1)
Sodium: 134 mmol/L — ABNORMAL LOW (ref 135–145)
Total Bilirubin: 0.4 mg/dL (ref 0.3–1.2)
Total Protein: 6.2 g/dL — ABNORMAL LOW (ref 6.5–8.1)

## 2021-05-01 NOTE — ED Provider Notes (Signed)
Emergency Medicine Provider Triage Evaluation Note  [name] , a 80 y.o. female  was evaluated in triage.  Pt complains of rectal bleeding since Sunday characterized as dark stools. She has a history of GI bleeds in the past. Reports associated dyspnea on exertion, dizziness, and fatigue.  Review of Systems  Positive:  Negative: See above   Physical Exam  BP (!) 135/52 (BP Location: Left Arm)   Pulse 93   Temp 98.2 F (36.8 C) (Oral)   Resp 16   SpO2 96%  Gen:   Awake, no distress   Resp:  Normal effort  MSK:   Moves extremities without difficulty  Other:  Pale   Medical Decision Making  Medically screening exam initiated at 6:47 PM.  Appropriate orders placed.  Marliyah Reid Amy was informed that the remainder of the evaluation will be completed by another provider, this initial triage assessment does not replace that evaluation, and the importance of remaining in the ED until their evaluation is complete.     Myna Bright Gays, PA-C 05/01/21 1848    Lucrezia Starch, MD 05/04/21 [phone]

## 2021-05-01 NOTE — ED Triage Notes (Signed)
Pt from [facility] with hx of GI bleeding for eval of both dark tarry stool on Sunday which then changed to BRB. Pt very pale with generalized weakness and shortness of breath. Had blood drawn today at 1600 that showed Hgb 5.7.

## 2021-05-02 ENCOUNTER — Encounter (HOSPITAL_COMMUNITY): Payer: Self-pay | Admitting: Internal Medicine

## 2021-05-02 DIAGNOSIS — D62 Acute posthemorrhagic anemia: Secondary | ICD-10-CM | POA: Diagnosis not present

## 2021-05-02 DIAGNOSIS — K625 Hemorrhage of anus and rectum: Secondary | ICD-10-CM | POA: Diagnosis not present

## 2021-05-02 DIAGNOSIS — D509 Iron deficiency anemia, unspecified: Secondary | ICD-10-CM | POA: Diagnosis present

## 2021-05-02 DIAGNOSIS — K922 Gastrointestinal hemorrhage, unspecified: Secondary | ICD-10-CM | POA: Diagnosis not present

## 2021-05-02 LAB — BASIC METABOLIC PANEL
Anion gap: 4 — ABNORMAL LOW (ref 5–15)
Anion gap: 7 (ref 5–15)
BUN: 7 mg/dL — ABNORMAL LOW (ref 8–23)
BUN: 5 mg/dL — ABNORMAL LOW (ref 8–23)
CO2: 27 mmol/L (ref 22–32)
CO2: 27 mmol/L (ref 22–32)
Calcium: 8.2 mg/dL — ABNORMAL LOW (ref 8.9–10.3)
Calcium: 8.5 mg/dL — ABNORMAL LOW (ref 8.9–10.3)
Chloride: 103 mmol/L (ref 98–111)
Chloride: 104 mmol/L (ref 98–111)
Creatinine, Ser: 0.46 mg/dL (ref 0.44–1.00)
Creatinine, Ser: 0.46 mg/dL (ref 0.44–1.00)
GFR, Estimated: >60 mL/min (ref >60)
GFR, Estimated: >60 mL/min (ref >60)
Glucose, Bld: 112 mg/dL — ABNORMAL HIGH (ref 70–99)
Glucose, Bld: 105 mg/dL — ABNORMAL HIGH (ref 70–99)
Potassium: 3.7 mmol/L (ref 3.5–5.1)
Potassium: 3.6 mmol/L (ref 3.5–5.1)
Sodium: 134 mmol/L — ABNORMAL LOW (ref 135–145)
Sodium: 138 mmol/L (ref 135–145)

## 2021-05-02 LAB — CBC
HCT: 20.6 % — ABNORMAL LOW (ref 36.0–46.0)
HCT: 25.2 % — ABNORMAL LOW (ref 36.0–46.0)
Hemoglobin: 6.1 g/dL — CL (ref 12.0–15.0)
Hemoglobin: 8.2 g/dL — ABNORMAL LOW (ref 12.0–15.0)
MCH: 23.4 pg — ABNORMAL LOW (ref 26.0–34.0)
MCH: 25.6 pg — ABNORMAL LOW (ref 26.0–34.0)
MCHC: 29.6 g/dL — ABNORMAL LOW (ref 30.0–36.0)
MCHC: 32.5 g/dL (ref 30.0–36.0)
MCV: 78.9 fL — ABNORMAL LOW (ref 80.0–100.0)
MCV: 78.8 fL — ABNORMAL LOW (ref 80.0–100.0)
Platelets: 195 10*3/uL (ref 150–400)
Platelets: 160 10*3/uL (ref 150–400)
RBC: 2.61 MIL/uL — ABNORMAL LOW (ref 3.87–5.11)
RBC: 3.2 MIL/uL — ABNORMAL LOW (ref 3.87–5.11)
RDW: 18 % — ABNORMAL HIGH (ref 11.5–15.5)
RDW: 17.8 % — ABNORMAL HIGH (ref 11.5–15.5)
WBC: 6.9 10*3/uL (ref 4.0–10.5)
WBC: 6.2 10*3/uL (ref 4.0–10.5)
nRBC: 0 % (ref 0.0–0.2)
nRBC: 0 % (ref 0.0–0.2)

## 2021-05-02 LAB — RESP PANEL BY RT-PCR (FLU A&B, COVID) ARPGX2
Influenza A by PCR: NEGATIVE
Influenza B by PCR: NEGATIVE
SARS Coronavirus 2 by RT PCR: NEGATIVE

## 2021-05-02 LAB — PREPARE RBC (CROSSMATCH)

## 2021-05-02 LAB — POC OCCULT BLOOD, ED: Fecal Occult Bld: POSITIVE — AB

## 2021-05-02 LAB — HEMOGLOBIN AND HEMATOCRIT, BLOOD
HCT: 26.7 % — ABNORMAL LOW (ref 36.0–46.0)
Hemoglobin: 8.5 g/dL — ABNORMAL LOW (ref 12.0–15.0)

## 2021-05-02 LAB — MAGNESIUM: Magnesium: 2 mg/dL (ref 1.7–2.4)

## 2021-05-02 MED ORDER — ROPINIROLE HCL 1 MG PO TABS: 0.5000 mg | ORAL_TABLET | Freq: Every day | ORAL | Status: DC

## 2021-05-02 MED ORDER — SODIUM CHLORIDE 0.9 % IV SOLN: 10.0000 mL/h | Freq: Once | INTRAVENOUS | Status: DC

## 2021-05-02 MED ORDER — PANTOPRAZOLE SODIUM 40 MG IV SOLR: 40.0000 mg | Freq: Two times a day (BID) | INTRAVENOUS | Status: DC

## 2021-05-02 MED ORDER — PANTOPRAZOLE 80MG IVPB - SIMPLE MED
80.0000 mg | Freq: Once | INTRAVENOUS | Status: AC
  Administered 2021-05-02: 80 mg via INTRAVENOUS
  Filled 2021-05-02: qty 80

## 2021-05-02 MED ORDER — DORZOLAMIDE HCL-TIMOLOL MAL 2-0.5 % OP SOLN
1.0000 [drp] | Freq: Two times a day (BID) | OPHTHALMIC | Status: DC
  Administered 2021-05-02 – 2021-05-10 (×17): 1 [drp] via OPHTHALMIC
  Filled 2021-05-02 (×2): qty 10

## 2021-05-02 MED ORDER — ROPINIROLE HCL 0.5 MG PO TABS
0.5000 mg | ORAL_TABLET | Freq: Every day | ORAL | Status: DC
  Administered 2021-05-02 – 2021-05-03 (×2): 0.5 mg via ORAL
  Filled 2021-05-02 (×4): qty 1

## 2021-05-02 MED ORDER — ACETAMINOPHEN 325 MG PO TABS
650.0000 mg | ORAL_TABLET | Freq: Four times a day (QID) | ORAL | Status: DC | PRN
  Administered 2021-05-02 – 2021-05-09 (×7): 650 mg via ORAL
  Filled 2021-05-02 (×7): qty 2

## 2021-05-02 MED ORDER — HYDRALAZINE HCL 20 MG/ML IJ SOLN: 5.0000 mg | INTRAMUSCULAR | Status: DC | PRN

## 2021-05-02 MED ORDER — SERTRALINE HCL 50 MG PO TABS
150.0000 mg | ORAL_TABLET | Freq: Every day | ORAL | Status: DC
  Administered 2021-05-02 – 2021-05-10 (×9): 150 mg via ORAL
  Filled 2021-05-02 (×9): qty 1

## 2021-05-02 MED ORDER — ONDANSETRON 4 MG PO TBDP
4.0000 mg | ORAL_TABLET | Freq: Two times a day (BID) | ORAL | Status: DC
  Administered 2021-05-02 – 2021-05-10 (×13): 4 mg via ORAL
  Filled 2021-05-02 (×17): qty 1

## 2021-05-02 MED ORDER — LATANOPROST 0.005 % OP SOLN
1.0000 [drp] | Freq: Every day | OPHTHALMIC | Status: DC
  Administered 2021-05-03 – 2021-05-09 (×7): 1 [drp] via OPHTHALMIC
  Filled 2021-05-02 (×2): qty 2.5

## 2021-05-02 MED ORDER — PANTOPRAZOLE INFUSION (NEW) - SIMPLE MED
8.0000 mg/h | INTRAVENOUS | Status: DC
  Administered 2021-05-02 – 2021-05-03 (×3): 8 mg/h via INTRAVENOUS
  Filled 2021-05-02: qty 80
  Filled 2021-05-02 (×2): qty 100
  Filled 2021-05-02 (×2): qty 80

## 2021-05-02 NOTE — H&P (Signed)
History and Physical    [name] GDJ:[id number] DOB: 08/08/1940 DOA: 05/01/2021  PCP: [name], MD  Patient coming from: Home.  Chief Complaint: Rectal bleeding.  HPI: [name] is a 80 y.o. female with history of COPD on home oxygen, hypertension who was admitted in June of this year for diverticular bleed underwent embolization for severe bleeding presents to the ER after patient was found to have low hemoglobin with ongoing rectal bleeding.  Patient's symptoms started about 4 days ago with dark stools which eventually became bright red blood per rectum.  Denies any abdominal pain.  No nausea vomiting.  Denies taking any NSAIDs or antiplatelet agents.  Had gone to her PCP had blood work done showed low hemoglobin was advised to come to the ER.  Last bowel movement was bloody at around 4 PM yesterday.  ED Course: In the ER patient is hemodynamically stable hemoglobin is around 5.8 usually is around 8 when patient was discharged in June of this year after embolization.  COVID test is negative.  Patient is being transfused 2 units of PRBC and since patient's stool also was mildly melanotic Protonix was started.  Review of Systems: As per HPI, rest all negative.   Past Medical History:  Diagnosis Date   Anxiety    Arthritis    "bilateral knees, shoulders, elbows; neck, pretty widespread" (09/05/2017)   BPPV (benign paroxysmal positional vertigo)    Depression    GERD (gastroesophageal reflux disease)    Glaucoma, both eyes    Headache    "probably 2/month" (09/05/2017)   History of blood transfusion ~ 2008   "related to LGIB"   Hyperlipemia    Lower GI bleeding ~ 2008; 09/05/2017   "had to have blood transfusion"   Macular degeneration, bilateral    Osteopenia    Seasonal allergies    Skin cancer, basal cell 2001   "off my nose, left side"   Sleeping excessive    Tinnitus of both ears     Past Surgical History:  Procedure Laterality Date   BASAL CELL CARCINOMA  EXCISION  2001   "off my nose, left side"   BLEPHAROPLASTY Bilateral    CATARACT EXTRACTION W/ INTRAOCULAR LENS  IMPLANT, BILATERAL Bilateral 1990's   EYE SURGERY Bilateral    "to improve vision after cataract OR"   IR ANGIOGRAM FOLLOW UP STUDY  12/19/2020   IR ANGIOGRAM SELECTIVE EACH ADDITIONAL VESSEL  12/19/2020   IR ANGIOGRAM SELECTIVE EACH ADDITIONAL VESSEL  12/19/2020   IR ANGIOGRAM VISCERAL SELECTIVE  12/19/2020   IR EMBO ART  VEN HEMORR LYMPH EXTRAV  INC GUIDE ROADMAPPING  12/19/2020   IR US GUIDE VASC ACCESS RIGHT  12/19/2020   JOINT REPLACEMENT     STAPEDES SURGERY Left    "scraped stapedes because it was sticking when it wasn't suppose to"   [place] Left ~ 2008   TOTAL KNEE ARTHROPLASTY  12/20/2011   Procedure: TOTAL KNEE ARTHROPLASTY;  Surgeon: Augustin Schooling, MD;  Location: [facility];  Service: Orthopedics;  Laterality: Right;  Right Total Knee Arthroplasty   TUBAL LIGATION  1980's     reports that she quit smoking about 28 years ago. She has a 27.75 pack-year smoking history. She has never used smokeless tobacco. She reports current alcohol use of about 7.0 standard drinks per week. She reports that she does not use drugs.  Allergies  Allergen Reactions   Morphine And Related  Itching   Abaloparatide Other (See Comments)   Aspirin Other (See Comments)   Duloxetine Hcl Other (See Comments)   Ventolin [Albuterol]     Other reaction(s): rapid heart beat   Cefadroxil Hives    Patient can take amoxicillin and cipro    Family History  Problem Relation Age of Onset   Heart attack Mother    Heart disease Father    Heart failure Father    Bladder Cancer Father    Supraventricular tachycardia Sister    Hypertension Sister     Prior to Admission medications   Medication Sig Start Date End Date Taking? Authorizing Provider  acetaminophen (TYLENOL) 500 MG tablet Take 1,000 mg by mouth daily as needed for headache.   Yes  [provider]  albuterol (PROVENTIL HFA;VENTOLIN HFA) 108 (90 Base) MCG/ACT inhaler Inhale 2 puffs into the lungs daily as needed. For seasonal allergies . Use 2 puffs 3 times daily x 4 days then back to home regimen. Patient taking differently: Inhale 2 puffs into the lungs daily as needed for wheezing or shortness of breath. 08/10/17  Yes Eugenie Filler, MD  Cholecalciferol (VITAMIN D3 PO) Take 1 tablet by mouth daily.   Yes [provider]  dorzolamide-timolol (COSOPT) 22.3-6.8 MG/ML ophthalmic solution Place 1 drop into both eyes 2 (two) times daily.   Yes [provider]  HYDROcodone-acetaminophen (NORCO/VICODIN) 5-325 MG tablet Take 1 tablet by mouth every 6 (six) hours as needed for moderate pain. 12/25/20  Yes [provider]  latanoprost (XALATAN) 0.005 % ophthalmic solution Place 1 drop into both eyes at bedtime. 02/13/18  Yes [provider]  loratadine (CLARITIN) 10 MG tablet Take 1 tablet (10 mg total) by mouth daily. 08/11/17  Yes Eugenie Filler, MD  Multiple Vitamin (MULTIVITAMIN WITH MINERALS) TABS Take 1 tablet by mouth daily.   Yes [provider]  omeprazole (PRILOSEC) 20 MG capsule Take 20 mg by mouth every evening.   Yes [provider]  OXYGEN 2lpm with sleep only  [facility]   Yes [provider]  propranolol (INDERAL) 20 MG tablet Take 20 mg by mouth See admin instructions. 20 mg qd  20 mg  as needed for heartracing 04/30/21  Yes [provider]  rOPINIRole (REQUIP) 0.5 MG tablet Take 1 tablet (0.5 mg total) by mouth at bedtime. Patient taking differently: Take 0.5 mg by mouth at bedtime as needed (restless legs). 12/23/20  Yes Nita Sells, MD  sertraline (ZOLOFT) 100 MG tablet Take 1 tablet (100 mg total) by mouth daily. Patient taking differently: Take 150 mg by mouth daily. 12/23/20  Yes Nita Sells, MD  simvastatin (ZOCOR) 20 MG tablet Take 20 mg by mouth daily at 6 PM.    Yes [provider]  vitamin C (ASCORBIC ACID) 500 MG tablet Take 500 mg by mouth daily.   Yes [provider]  ALPRAZolam (XANAX) 0.25 MG tablet Take 1 tablet (0.25 mg total) by mouth daily as needed for anxiety. Patient not taking: No sig reported 09/14/17   Aline August, MD  DULoxetine (CYMBALTA) 20 MG capsule Take 1 capsule (20 mg total) by mouth daily. Patient not taking: No sig reported 12/23/20   Nita Sells, MD  nitrofurantoin, macrocrystal-monohydrate, (MACROBID) 100 MG capsule Take 100 mg by mouth See admin instructions. hs x 7 days Patient not taking: No sig reported 01/15/21   [provider]  sulfamethoxazole-trimethoprim (BACTRIM DS) 800-160 MG tablet Take 1 tablet by mouth See admin instructions. Bid  x 7 days Patient not taking: Reported on 05/02/2021 01/24/21   [provider]    Physical Exam: Constitutional: Moderately built and nourished. Vitals:   05/02/21 0147 05/02/21 0200 05/02/21 0215 05/02/21 0222  BP: 138/73 (!) 141/81 140/74 (!) 149/66  Pulse: 88 (!) 103 (!) 102 99  Resp: 20 (!) 29 (!) 28 20  Temp: 98.2 F (36.8 C)   98.1 F (36.7 C)  TempSrc: Oral   Oral  SpO2: 100% 100% 100% 100%   Eyes: Anicteric no pallor. ENMT: No discharge from the ears eyes nose and mouth. Neck: No mass felt.  No neck rigidity. Respiratory: No rhonchi or crepitations. Cardiovascular: S1-S2 heard. Abdomen: Soft nontender bowel sound present. Musculoskeletal: No edema. Skin: No rash. Neurologic: Alert awake oriented to time place and person.  Moves all extremities. Psychiatric: Appears normal.  Normal affect.   Labs on Admission: I have personally reviewed following labs and imaging studies  CBC: Recent Labs  Lab 05/01/21 1839  WBC 7.4  HGB 5.8*  HCT 19.5*  MCV 78.0*  PLT 751   Basic Metabolic Panel: Recent Labs  Lab 05/01/21 1839  NA 134*  K 4.1  CL 102  CO2 25  GLUCOSE 137*  BUN 11  CREATININE 0.46  CALCIUM 8.7*    GFR: CrCl cannot be calculated (Unknown ideal weight.). Liver Function Tests: Recent Labs  Lab 05/01/21 1839  AST 20  ALT 13  ALKPHOS 62  BILITOT 0.4  PROT 6.2*  ALBUMIN 3.4*   No results for input(s): LIPASE, AMYLASE in the last 168 hours. No results for input(s): AMMONIA in the last 168 hours. Coagulation Profile: No results for input(s): INR, PROTIME in the last 168 hours. Cardiac Enzymes: No results for input(s): CKTOTAL, CKMB, CKMBINDEX, TROPONINI in the last 168 hours. BNP (last 3 results) No results for input(s): PROBNP in the last 8760 hours. HbA1C: No results for input(s): HGBA1C in the last 72 hours. CBG: No results for input(s): GLUCAP in the last 168 hours. Lipid Profile: No results for input(s): CHOL, HDL, LDLCALC, TRIG, CHOLHDL, LDLDIRECT in the last 72 hours. Thyroid Function Tests: No results for input(s): TSH, T4TOTAL, FREET4, T3FREE, THYROIDAB in the last 72 hours. Anemia Panel: No results for input(s): VITAMINB12, FOLATE, FERRITIN, TIBC, IRON, RETICCTPCT in the last 72 hours. Urine analysis:    Component Value Date/Time   COLORURINE YELLOW 09/06/2017 0721   APPEARANCEUR HAZY (A) 09/06/2017 0721   LABSPEC 1.023 09/06/2017 0721   PHURINE 5.0 09/06/2017 0721   GLUCOSEU NEGATIVE 09/06/2017 0721   HGBUR NEGATIVE 09/06/2017 0721   BILIRUBINUR NEGATIVE 09/06/2017 0721   KETONESUR 20 (A) 09/06/2017 0721   PROTEINUR NEGATIVE 09/06/2017 0721   UROBILINOGEN 0.2 10/20/2007 2015   NITRITE POSITIVE (A) 09/06/2017 0721   LEUKOCYTESUR NEGATIVE 09/06/2017 0721   Sepsis Labs: @LABRCNTIP (procalcitonin:4,lacticidven:4) ) Recent Results (from the past 240 hour(s))  Resp Panel by RT-PCR (Flu A&B, Covid) Nasopharyngeal Swab     Status: None   Collection Time: 05/02/21 12:28 AM   Specimen: Nasopharyngeal Swab; Nasopharyngeal(NP) swabs in vial transport medium  Result Value Ref Range Status   SARS Coronavirus 2 by RT PCR NEGATIVE NEGATIVE Final    Comment:  (NOTE) SARS-CoV-2 target nucleic acids are NOT DETECTED.  The SARS-CoV-2 RNA is generally detectable in upper respiratory specimens during the acute phase of infection. The lowest concentration of SARS-CoV-2 viral copies this assay can detect is 138 copies/mL. A negative result does not preclude SARS-Cov-2 infection and should not be used as  the sole basis for treatment or other patient management decisions. A negative result may occur with  improper specimen collection/handling, submission of specimen other than nasopharyngeal swab, presence of viral mutation(s) within the areas targeted by this assay, and inadequate number of viral copies(<138 copies/mL). A negative result must be combined with clinical observations, patient history, and epidemiological information. The expected result is Negative.  Fact Sheet for Patients:  EntrepreneurPulse.com.au  Fact Sheet for Healthcare Providers:  IncredibleEmployment.be  This test is no t yet approved or cleared by the [place] FDA and  has been authorized for detection and/or diagnosis of SARS-CoV-2 by FDA under an Emergency Use Authorization (EUA). This EUA will remain  in effect (meaning this test can be used) for the duration of the COVID-19 declaration under Section 564(b)(1) of the Act, 21 U.S.C.section 360bbb-3(b)(1), unless the authorization is terminated  or revoked sooner.       Influenza A by PCR NEGATIVE NEGATIVE Final   Influenza B by PCR NEGATIVE NEGATIVE Final    Comment: (NOTE) The Xpert Xpress SARS-CoV-2/FLU/RSV plus assay is intended as an aid in the diagnosis of influenza from Nasopharyngeal swab specimens and should not be used as a sole basis for treatment. Nasal washings and aspirates are unacceptable for Xpert Xpress SARS-CoV-2/FLU/RSV testing.  Fact Sheet for Patients: EntrepreneurPulse.com.au  Fact Sheet for Healthcare  Providers: IncredibleEmployment.be  This test is not yet approved or cleared by the [place] FDA and has been authorized for detection and/or diagnosis of SARS-CoV-2 by FDA under an Emergency Use Authorization (EUA). This EUA will remain in effect (meaning this test can be used) for the duration of the COVID-19 declaration under Section 564(b)(1) of the Act, 21 U.S.C. section 360bbb-3(b)(1), unless the authorization is terminated or revoked.  Performed at [facility], [place] [address], [place], [place] [address]      Radiological Exams on Admission: No results found.   Assessment/Plan Principal Problem:   Acute GI bleeding Active Problems:   COPD GOLD II    Acute blood loss anemia    Acute GI bleed -has had previous GI bleed attributed to diverticulosis in June of this year had required embolization.  Since patient's stool is melanotic Protonix was started.  [facility] has been sent a message.  2 units PRBC transfusion has been ordered.  Patient is presently hemodynamically stable.  Closely monitor. Acute blood loss anemia secondary to GI bleed follow CBC after transfusion. COPD on home oxygen. Hypertension we will keep n.p.o. as needed IV hydralazine for systolic blood pressure more than 160.   DVT prophylaxis: SCDs.  Avoiding anticoagulation due to GI bleed. Code Status: Full code. Family Communication: Need to discuss with family. Disposition Plan: Home. Consults called: [facility] was sent message. Admission status: Observation.   Rise Patience MD [facility] Pager [phone].  If 7PM-7AM, please contact night-coverage [email or web address] Password [email or web address]  05/02/2021, 2:54 AM

## 2021-05-02 NOTE — Consult Note (Signed)
Referring Provider:  [facility] Primary Care Physician:  [name], MD Primary Gastroenterologist:  Dr. Oletta Lamas   Reason for Consultation: Rectal bleeding  HPI: [name] is a 80 y.o. female with past medical history of COPD on home oxygen, history of recurrent GI bleed presented to the hospital with GI bleed.Patient was also admitted to the hospital in June 2022 with painless hematochezia.  CT angio showed active bleeding from diverticuli in the distal transverse colon.  She underwent IR guided embolization on December 19, 2020.  Her hemoglobin was 8.8 on discharge in June 2022.  Hemoglobin on admission yesterday was 5.8.  Hemoglobin this morning is 6.1.  Normal platelet counts.   Patient seen and examined at bedside.  She started noticing abdominal cramps and rectal bleeding on Sunday.  Last episode of blood in the stool was on Monday.  She is now having some streaks of blood on the tissue paper without any overt blood in the stool.  Denies any further abdominal pain, nausea or vomiting.  Past Medical History:  Diagnosis Date   Anxiety    Arthritis    "bilateral knees, shoulders, elbows; neck, pretty widespread" (09/05/2017)   BPPV (benign paroxysmal positional vertigo)    Depression    GERD (gastroesophageal reflux disease)    Glaucoma, both eyes    Headache    "probably 2/month" (09/05/2017)   History of blood transfusion ~ 2008   "related to LGIB"   Hyperlipemia    Lower GI bleeding ~ 2008; 09/05/2017   "had to have blood transfusion"   Macular degeneration, bilateral    Osteopenia    Seasonal allergies    Skin cancer, basal cell 2001   "off my nose, left side"   Sleeping excessive    Tinnitus of both ears     Past Surgical History:  Procedure Laterality Date   BASAL CELL CARCINOMA EXCISION  2001   "off my nose, left side"   BLEPHAROPLASTY Bilateral    CATARACT EXTRACTION W/ INTRAOCULAR LENS  IMPLANT, BILATERAL Bilateral 1990's   EYE SURGERY Bilateral    "to improve vision after  cataract OR"   IR ANGIOGRAM FOLLOW UP STUDY  12/19/2020   IR ANGIOGRAM SELECTIVE EACH ADDITIONAL VESSEL  12/19/2020   IR ANGIOGRAM SELECTIVE EACH ADDITIONAL VESSEL  12/19/2020   IR ANGIOGRAM VISCERAL SELECTIVE  12/19/2020   IR EMBO ART  VEN HEMORR LYMPH EXTRAV  INC GUIDE ROADMAPPING  12/19/2020   IR US GUIDE VASC ACCESS RIGHT  12/19/2020   JOINT REPLACEMENT     STAPEDES SURGERY Left    "scraped stapedes because it was sticking when it wasn't suppose to"   [place] Left ~ 2008   TOTAL KNEE ARTHROPLASTY  12/20/2011   Procedure: TOTAL KNEE ARTHROPLASTY;  Surgeon: Augustin Schooling, MD;  Location: [place] [facility];  Service: Orthopedics;  Laterality: Right;  Right Total Knee Arthroplasty   TUBAL LIGATION  1980's    Prior to Admission medications   Medication Sig Start Date End Date Taking? Authorizing Provider  acetaminophen (TYLENOL) 500 MG tablet Take 1,000 mg by mouth daily as needed for headache.   Yes [provider]  albuterol (PROVENTIL HFA;VENTOLIN HFA) 108 (90 Base) MCG/ACT inhaler Inhale 2 puffs into the lungs daily as needed. For seasonal allergies . Use 2 puffs 3 times daily x 4 days then back to home regimen. Patient taking differently: Inhale 2 puffs into the lungs daily as needed for wheezing or shortness  of breath. 08/10/17  Yes Eugenie Filler, MD  Cholecalciferol (VITAMIN D3 PO) Take 1 tablet by mouth daily.   Yes [provider]  dorzolamide-timolol (COSOPT) 22.3-6.8 MG/ML ophthalmic solution Place 1 drop into both eyes 2 (two) times daily.   Yes [provider]  HYDROcodone-acetaminophen (NORCO/VICODIN) 5-325 MG tablet Take 1 tablet by mouth every 6 (six) hours as needed for moderate pain. 12/25/20  Yes [provider]  latanoprost (XALATAN) 0.005 % ophthalmic solution Place 1 drop into both eyes at bedtime. 02/13/18  Yes [provider]  loratadine (CLARITIN) 10 MG tablet Take 1 tablet (10 mg  total) by mouth daily. 08/11/17  Yes Eugenie Filler, MD  Multiple Vitamin (MULTIVITAMIN WITH MINERALS) TABS Take 1 tablet by mouth daily.   Yes [provider]  omeprazole (PRILOSEC) 20 MG capsule Take 20 mg by mouth every evening.   Yes [provider]  OXYGEN 2lpm with sleep only  [facility]   Yes [provider]  propranolol (INDERAL) 20 MG tablet Take 20 mg by mouth See admin instructions. 20 mg qd  20 mg  as needed for heartracing 04/30/21  Yes [provider]  rOPINIRole (REQUIP) 0.5 MG tablet Take 1 tablet (0.5 mg total) by mouth at bedtime. Patient taking differently: Take 0.5 mg by mouth at bedtime as needed (restless legs). 12/23/20  Yes Nita Sells, MD  sertraline (ZOLOFT) 100 MG tablet Take 1 tablet (100 mg total) by mouth daily. Patient taking differently: Take 150 mg by mouth daily. 12/23/20  Yes Nita Sells, MD  simvastatin (ZOCOR) 20 MG tablet Take 20 mg by mouth daily at 6 PM.   Yes [provider]  vitamin C (ASCORBIC ACID) 500 MG tablet Take 500 mg by mouth daily.   Yes [provider]  ALPRAZolam (XANAX) 0.25 MG tablet Take 1 tablet (0.25 mg total) by mouth daily as needed for anxiety. Patient not taking: No sig reported 09/14/17   Aline August, MD  DULoxetine (CYMBALTA) 20 MG capsule Take 1 capsule (20 mg total) by mouth daily. Patient not taking: No sig reported 12/23/20   Nita Sells, MD  nitrofurantoin, macrocrystal-monohydrate, (MACROBID) 100 MG capsule Take 100 mg by mouth See admin instructions. hs x 7 days Patient not taking: No sig reported 01/15/21   [provider]  sulfamethoxazole-trimethoprim (BACTRIM DS) 800-160 MG tablet Take 1 tablet by mouth See admin instructions. Bid x 7 days Patient not taking: Reported on 05/02/2021 01/24/21   [provider]    Scheduled Meds:  dorzolamide-timolol  1 drop Both Eyes BID   latanoprost  1 drop Both Eyes QHS   [START ON  05/05/2021] pantoprazole  40 mg Intravenous Q12H   Continuous Infusions:  sodium chloride     pantoprazole 8 mg/hr (05/02/21 0215)   PRN Meds:.hydrALAZINE  Allergies as of 05/01/2021 - Review Complete 05/01/2021  Allergen Reaction Noted   Morphine and related Itching 11/29/2011   Abaloparatide Other (See Comments) 10/22/2020   Aspirin Other (See Comments) 10/22/2020   Duloxetine hcl Other (See Comments) 10/22/2020   Ventolin [albuterol]  12/25/2020   Cefadroxil Hives 12/11/2011    Family History  Problem Relation Age of Onset   Heart attack Mother    Heart disease Father    Heart failure Father    Bladder Cancer Father    Supraventricular tachycardia Sister    Hypertension Sister     Social History   Socioeconomic History   Marital status: Married    Spouse  name: Clair Gulling    Number of children: 3   Years of education: College   Highest education level: Not on file  Occupational History   Occupation: Retired Pharmacist, hospital  Tobacco Use   Smoking status: Former    Packs/day: 0.75    Years: 37.00    Pack years: 27.75    Types: Cigarettes    Quit date: 07/01/1992    Years since quitting: 28.8   Smokeless tobacco: Never  Vaping Use   Vaping Use: Never used  Substance and Sexual Activity   Alcohol use: Yes    Alcohol/week: 7.0 standard drinks    Types: 7 Glasses of wine per week    Comment: 09/05/2017  "6 oz wine, 5 days/wk"   Drug use: No    Types: Hydrocodone   Sexual activity: Not Currently  Other Topics Concern   Not on file  Social History Narrative   1-2 cups of coffee a day    Social Determinants of Health   Financial Resource Strain: Not on file  Food Insecurity: Not on file  Transportation Needs: Not on file  Physical Activity: Not on file  Stress: Not on file  Social Connections: Not on file  Intimate Partner Violence: Not on file    Review of Systems: All negative except as stated above in HPI.  Physical Exam: Vital signs: Vitals:   05/02/21 0748  05/02/21 0800  BP:  127/69  Pulse:  74  Resp:  (!) 21  Temp: 98.4 F (36.9 C)   SpO2:  97%   Physical Exam Constitutional:      Appearance: Normal appearance.  HENT:     Head: Normocephalic and atraumatic.     Nose: Nose normal.  Eyes:     General: No scleral icterus.    Extraocular Movements: Extraocular movements intact.  Cardiovascular:     Rate and Rhythm: Normal rate and regular rhythm.     Heart sounds: Murmur heard.  Pulmonary:     Comments: Decreased breath sounds bilaterally, anterior exam only.  No visible respiratory distress Abdominal:     General: Bowel sounds are normal. There is no distension.     Palpations: Abdomen is soft.     Tenderness: There is no abdominal tenderness. There is no guarding.  Musculoskeletal:     Right lower leg: Edema present.     Left lower leg: Edema present.  Skin:    General: Skin is warm.     Coloration: Skin is not jaundiced.  Neurological:     Mental Status: She is alert and oriented to person, place, and time.  Psychiatric:        Thought Content: Thought content normal.        Judgment: Judgment normal.     GI:  Lab Results: Recent Labs    05/01/21 1839 05/02/21 0416  WBC 7.4 6.9  HGB 5.8* 6.1*  HCT 19.5* 20.6*  PLT 216 195   BMET Recent Labs    05/01/21 1839 05/02/21 0416  NA 134* 134*  K 4.1 3.7  CL 102 103  CO2 25 27  GLUCOSE 137* 112*  BUN 11 7*  CREATININE 0.46 0.46  CALCIUM 8.7* 8.2*   LFT Recent Labs    05/01/21 1839  PROT 6.2*  ALBUMIN 3.4*  AST 20  ALT 13  ALKPHOS 62  BILITOT 0.4   PT/INR No results for input(s): LABPROT, INR in the last 72 hours.   Studies/Results: No results found.  Impression/Plan: -Rectal bleeding.  Most  likely diverticular bleed.  Last episode of blood in the stool was on Monday.  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 -Acute blood loss anemia. -COPD on chronic oxygen  supplements  Recommendations ------------------------- -Patient's bleeding seems to be have resolved.  Do not think repeating bleeding scan or CT angiogram at this time would be of any help.  If she starts having overt bleeding again, recommend stat CT angio. -Monitor H&H.  Transfuse to keep hemoglobin around 8. -Okay to have full liquid diet today -GI will follow    LOS: 0 days   Otis Brace  MD, FACP 05/02/2021, 8:22 AM  Contact #  [phone]

## 2021-05-02 NOTE — Evaluation (Signed)
Physical Therapy Evaluation Patient Details Name: [name] MRN: [id number] DOB: 01-02-1941 Today's Date: 05/02/2021  History of Present Illness  80 y.o. female presents to [facility] on 05/01/2021 with low hemoglobin and ongoing rectal bleeding. PMH includes COPD on home oxygen, hypertension.  Clinical Impression  Pt presents to PT with deficits in functional mobility, gait, balance, strength, power, endurance. Pt demonstrates instability at this time, benefiting from UE support of RW and experiencing 2 posterior losses of balance when ambulating/transferring during session. Pt will benefit from frequent mobilization, ambulating to bathroom or in the room multiple times daily to aide in a quick recovery. PT will follow up tomorrow to progress mobility. PT recommends return home with continued HHPT and assistance from spouse.       Recommendations for follow up therapy are one component of a multi-disciplinary discharge planning process, led by the attending physician.  Recommendations may be updated based on patient status, additional functional criteria and insurance authorization.  Follow Up Recommendations Home health PT    Assistance Recommended at Discharge Intermittent Supervision/Assistance  Functional Status Assessment Patient has had a recent decline in their functional status and demonstrates the ability to make significant improvements in function in a reasonable and predictable amount of time.  Equipment Recommendations  None recommended by PT (pt owns necessary DME)    Recommendations for Other Services       Precautions / Restrictions Precautions Precautions: Fall Restrictions Weight Bearing Restrictions: No      Mobility  Bed Mobility Overal bed mobility: Needs Assistance Bed Mobility: Supine to Sit;Sit to Supine     Supine to sit: Supervision Sit to supine: Supervision        Transfers Overall transfer level: Needs assistance Equipment used: Rolling  walker (2 wheels);1 person hand held assist Transfers: Sit to/from [facility] Sit to Stand: Min guard Stand pivot transfers: Min assist         General transfer comment: posterior loss of balance during SPT without UE support of assistive device    Ambulation/Gait Ambulation/Gait assistance: Min assist Gait Distance (Feet): 40 Feet Assistive device: Rolling walker (2 wheels) Gait Pattern/deviations: Step-through pattern Gait velocity: reduced Gait velocity interpretation: <1.8 ft/sec, indicate of risk for recurrent falls General Gait Details: pt with slowed step-through gait, 2 mild losses of balance, one requiring PT assistance to correct  Stairs            Wheelchair Mobility    Modified Rankin (Stroke Patients Only)       Balance Overall balance assessment: Needs assistance Sitting-balance support: No upper extremity supported;Feet supported Sitting balance-Leahy Scale: Good     Standing balance support: Single extremity supported;Bilateral upper extremity supported Standing balance-Leahy Scale: Poor Standing balance comment: benefits from UE support of assistive device, posterior loss of balance during session                             Pertinent Vitals/Pain Pain Assessment: No/denies pain    Home Living Family/patient expects to be discharged to:: Private residence Living Arrangements: Spouse/significant other Available Help at Discharge: Family;Available 24 hours/day;Personal care attendant Type of Home: House Home Access: Stairs to enter Entrance Stairs-Rails: Left Entrance Stairs-Number of Steps: 3   Home Layout: One level Home Equipment: Conservation officer, nature (2 wheels);Rollator (4 wheels);Cane - single point;Shower seat - built in      Prior Function Prior Level of Function : Needs assist  Physical Assist : ADLs (physical)   ADLs (physical): Bathing (washing hair) Mobility Comments: pt ambulates with use of RW  indoors and rollator in the community       Hand Dominance   Dominant Hand: Right    Extremity/Trunk Assessment   Upper Extremity Assessment Upper Extremity Assessment: RUE deficits/detail RUE Deficits / Details: RUE grossly 4/5 based on observed mobility    Lower Extremity Assessment Lower Extremity Assessment: Generalized weakness    Cervical / Trunk Assessment Cervical / Trunk Assessment: Kyphotic  Communication   Communication: No difficulties  Cognition Arousal/Alertness: Awake/alert Behavior During Therapy: WFL for tasks assessed/performed Overall Cognitive Status: Within Functional Limits for tasks assessed                                          General Comments General comments (skin integrity, edema, etc.): pt tachy into 110s observed when mobilizing    Exercises     Assessment/Plan    PT Assessment Patient needs continued PT services  PT Problem List Decreased strength;Decreased activity tolerance;Decreased balance;Decreased mobility;Cardiopulmonary status limiting activity       PT Treatment Interventions DME instruction;Gait training;Stair training;Functional mobility training;Therapeutic activities;Therapeutic exercise;Balance training;Neuromuscular re-education;Patient/family education    PT Goals (Current goals can be found in the Care Plan section)  Acute Rehab PT Goals Patient Stated Goal: to return to prior level of function PT Goal Formulation: With patient Time For Goal Achievement: 05/16/21 Potential to Achieve Goals: Good    Frequency Min 3X/week   Barriers to discharge        Co-evaluation               AM-PAC PT "6 Clicks" Mobility  Outcome Measure Help needed turning from your back to your side while in a flat bed without using bedrails?: A Little Help needed moving from lying on your back to sitting on the side of a flat bed without using bedrails?: A Little Help needed moving to and from a bed to a  chair (including a wheelchair)?: A Little Help needed standing up from a chair using your arms (e.g., wheelchair or bedside chair)?: A Little Help needed to walk in hospital room?: A Little Help needed climbing 3-5 steps with a railing? : A Lot 6 Click Score: 17    End of Session   Activity Tolerance: Patient limited by fatigue Patient left: in bed;with call bell/phone within reach;with bed alarm set;with family/visitor present Nurse Communication: Mobility status PT Visit Diagnosis: Unsteadiness on feet (R26.81);Other abnormalities of gait and mobility (R26.89);Muscle weakness (generalized) (M62.81)    Time: 7078-6754 PT Time Calculation (min) (ACUTE ONLY): 26 min   Charges:   PT Evaluation $PT Eval Low Complexity: [place], PT, DPT Acute Rehabilitation Pager: [phone] Office [phone]   Zenaida Niece 05/02/2021, 5:25 PM

## 2021-05-02 NOTE — Progress Notes (Signed)
PROGRESS NOTE    [name]  ONG:[id number] DOB: March 10, 1941 DOA: 05/01/2021 PCP: [name], MD   Chief Complaint  Patient presents with   GI Bleeding   Abnormal Lab    Brief Narrative:  [name] is [name] 80 y.o. female with history of COPD on home oxygen, hypertension who was admitted in June of this year for diverticular bleed underwent embolization for severe bleeding presents to the ER after patient was found to have low hemoglobin with ongoing rectal bleeding.  Patient's symptoms started about 4 days ago with dark stools which eventually became bright red blood per rectum.  Denies any abdominal pain.  No nausea vomiting.  Denies taking any NSAIDs or antiplatelet agents.  Had gone to her PCP had blood work done showed low hemoglobin was advised to come to the ER.  Last bowel movement was bloody at around 4 PM yesterday.   ED Course: In the ER patient is hemodynamically stable hemoglobin is around 5.8 usually is around 8 when patient was discharged in June of this year after embolization.  COVID test is negative.  Patient is being transfused 2 units of PRBC and since patient's stool also was mildly melanotic Protonix was started.   Assessment & Plan:   Principal Problem:   Acute GI bleeding Active Problems:   COPD GOLD II    Acute blood loss anemia  Acute Blood Loss Anemia  Acute GI Bleed 11/2020 had diverticular bleed which required IR c/s for embolization Hb 5.6 at presentation Improved after 2 units pRBC to 8.2 PPI gtt Continue to trend H/H GI c/s, following - recommending stat CTA if recurrent bleeding Follow iron studies, B12, folate  COPD on nocturnal O2 Seems at her baseline Not in exacerbation  Hypertension Not on home BP med that I see, follow  RLS Ropinirole  Depression Continue zoloft  Continue cosopt, latanaprost  DVT prophylaxis:SCD Code Status: full Family Communication:husband at bedside Disposition:   Status is: Observation  The  patient remains OBS appropriate and will d/c before 2 midnights.      Consultants:  GI  Procedures: none  Antimicrobials: Anti-infectives (From admission, onward)    None          Subjective: Asking to eat No recent bleeding noticed  Objective: Vitals:   05/02/21 1315 05/02/21 1400 05/02/21 1445 05/02/21 1504  BP: 123/66 128/60 133/74   Pulse: 83 86 88   Resp: 19 19 19    Temp:    98 F (36.7 C)  TempSrc:    Oral  SpO2: 100% 99% 100%     Intake/Output Summary (Last 24 hours) at 05/02/2021 1558 Last data filed at 05/02/2021 0748 Gross per 24 hour  Intake 928.25 ml  Output --  Net 928.25 ml   There were no vitals filed for this visit.  Examination:  General exam: Appears calm and comfortable  Respiratory system: unlabored Cardiovascular system: RRR Gastrointestinal system: Abdomen is nondistended, soft and nontender.  Central nervous system: Alert and oriented. No focal neurological deficits. Extremities: trace edema Skin: No rashes, lesions or ulcers Psychiatry: Judgement and insight appear normal. Mood & affect appropriate.     Data Reviewed: I have personally reviewed following labs and imaging studies  CBC: Recent Labs  Lab 05/01/21 1839 05/02/21 0416 05/02/21 0930  WBC 7.4 6.9 6.2  HGB 5.8* 6.1* 8.2*  HCT 19.5* 20.6* 25.2*  MCV 78.0* 78.9* 78.8*  PLT 216 195 440    Basic Metabolic Panel: Recent Labs  Lab  05/01/21 1839 05/02/21 0416 05/02/21 0930  NA 134* 134* 138  K 4.1 3.7 3.6  CL 102 103 104  CO2 25 27 27   GLUCOSE 137* 112* 105*  BUN 11 7* 5*  CREATININE 0.46 0.46 0.46  CALCIUM 8.7* 8.2* 8.5*    GFR: CrCl cannot be calculated (Unknown ideal weight.).  Liver Function Tests: Recent Labs  Lab 05/01/21 1839  AST 20  ALT 13  ALKPHOS 62  BILITOT 0.4  PROT 6.2*  ALBUMIN 3.4*    CBG: No results for input(s): GLUCAP in the last 168 hours.   Recent Results (from the past 240 hour(s))  Resp Panel by RT-PCR (Flu Jasalyn Frysinger&B,  Covid) Nasopharyngeal Swab     Status: None   Collection Time: 05/02/21 12:28 AM   Specimen: Nasopharyngeal Swab; Nasopharyngeal(NP) swabs in vial transport medium  Result Value Ref Range Status   SARS Coronavirus 2 by RT PCR NEGATIVE NEGATIVE Final    Comment: (NOTE) SARS-CoV-2 target nucleic acids are NOT DETECTED.  The SARS-CoV-2 RNA is generally detectable in upper respiratory specimens during the acute phase of infection. The lowest concentration of SARS-CoV-2 viral copies this assay can detect is 138 copies/mL. Aleighya Mcanelly negative result does not preclude SARS-Cov-2 infection and should not be used as the sole basis for treatment or other patient management decisions. Rawleigh Rode negative result may occur with  improper specimen collection/handling, submission of specimen other than nasopharyngeal swab, presence of viral mutation(s) within the areas targeted by this assay, and inadequate number of viral copies(<138 copies/mL). Eivan Gallina negative result must be combined with clinical observations, patient history, and epidemiological information. The expected result is Negative.  Fact Sheet for Patients:  EntrepreneurPulse.com.au  Fact Sheet for Healthcare Providers:  IncredibleEmployment.be  This test is no t yet approved or cleared by the [place] FDA and  has been authorized for detection and/or diagnosis of SARS-CoV-2 by FDA under an Emergency Use Authorization (EUA). This EUA will remain  in effect (meaning this test can be used) for the duration of the COVID-19 declaration under Section 564(b)(1) of the Act, 21 U.S.C.section 360bbb-3(b)(1), unless the authorization is terminated  or revoked sooner.       Influenza Jari Dipasquale by PCR NEGATIVE NEGATIVE Final   Influenza B by PCR NEGATIVE NEGATIVE Final    Comment: (NOTE) The Xpert Xpress SARS-CoV-2/FLU/RSV plus assay is intended as an aid in the diagnosis of influenza from Nasopharyngeal swab specimens and should  not be used as [place]avia [place] sole basis for treatment. Nasal washings and aspirates are unacceptable for Xpert Xpress SARS-CoV-2/FLU/RSV testing.  Fact Sheet for Patients: EntrepreneurPulse.com.au  Fact Sheet for Healthcare Providers: IncredibleEmployment.be  This test is not yet approved or cleared by the [place] FDA and has been authorized for detection and/or diagnosis of SARS-CoV-2 by FDA under an Emergency Use Authorization (EUA). This EUA will remain in effect (meaning this test can be used) for the duration of the COVID-19 declaration under Section 564(b)(1) of the Act, 21 U.S.C. section 360bbb-3(b)(1), unless the authorization is terminated or revoked.  Performed at [facility], [place] [address], [place], [place] [address]          Radiology Studies: No results found.      Scheduled Meds:  dorzolamide-timolol  1 drop Both Eyes BID   latanoprost  1 drop Both Eyes QHS   ondansetron  4 mg Oral Q12H   [START ON 05/05/2021] pantoprazole  40 mg Intravenous Q12H   rOPINIRole  0.5 mg Oral QHS  Continuous Infusions:  sodium chloride     pantoprazole 8 mg/hr (05/02/21 1056)     LOS: 0 days    Time spent: over 30 min    Fayrene Helper, MD [facility]   To contact the attending provider between 7A-7P or the covering provider during after hours 7P-7A, please log into the web site [email or web address] and access using universal [place] password for that web site. If you do not have the password, please call the hospital operator.  05/02/2021, 3:58 PM

## 2021-05-02 NOTE — ED Provider Notes (Signed)
[facility] EMERGENCY DEPARTMENT Provider Note   CSN: [id number] Arrival date & time: 05/01/21  1645     History Chief Complaint  Patient presents with   GI Bleeding   Abnormal Lab    [name] is a 80 y.o. female.  The history is provided by the patient and medical records.  Abnormal Lab  80 y.o. F with hx of anxiety, depression, GERD, hx of GI bleeding, HLP, osteopenia, presenting to the ED with dark stools.  States it started on Sunday, dark in color like tar.  States numerous bloody bowel movements and kind of trickled off by Monday evening but feeling increasingly weak and SOB.  No fevers.  States  history of GI bleeding in the past needed transfusion a few times.  Not currently on anticoagulation.  Denies any abdominal pain at present.  Followed by [facility].    Past Medical History:  Diagnosis Date   Anxiety    Arthritis    "bilateral knees, shoulders, elbows; neck, pretty widespread" (09/05/2017)   BPPV (benign paroxysmal positional vertigo)    Depression    GERD (gastroesophageal reflux disease)    Glaucoma, both eyes    Headache    "probably 2/month" (09/05/2017)   History of blood transfusion ~ 2008   "related to LGIB"   Hyperlipemia    Lower GI bleeding ~ 2008; 09/05/2017   "had to have blood transfusion"   Macular degeneration, bilateral    Osteopenia    Seasonal allergies    Skin cancer, basal cell 2001   "off my nose, left side"   Sleeping excessive    Tinnitus of both ears     Patient Active Problem List   Diagnosis Date Noted   Symptomatic anemia    DOE (dyspnea on exertion) 11/18/2017   COPD GOLD II  11/18/2017   Diverticulosis of intestine with bleeding 09/05/2017   Influenza A 08/08/2017   Acute respiratory failure with hypoxia ([place]) 08/07/2017   Depression 08/07/2017   Hyperlipemia 08/07/2017   Macular degeneration 08/07/2017   Glaucoma 08/07/2017   Weakness 08/07/2017   Dehydration 08/07/2017   Lower GI bleeding  10/14/2016   Diverticulosis of colon 10/14/2016   Anxiety 10/14/2016   Lower GI bleed 10/14/2016   Acute GI bleeding    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   Dermatochalasis of both upper eyelids 08/17/2014   Osteoarthrosis, unspecified whether generalized or localized, lower leg 12/20/2011    Past Surgical History:  Procedure Laterality Date   BASAL CELL CARCINOMA EXCISION  2001   "off my nose, left side"   BLEPHAROPLASTY Bilateral    CATARACT EXTRACTION W/ INTRAOCULAR LENS  IMPLANT, BILATERAL Bilateral 1990's   EYE SURGERY Bilateral    "to improve vision after cataract OR"   IR ANGIOGRAM FOLLOW UP STUDY  12/19/2020   IR ANGIOGRAM SELECTIVE EACH ADDITIONAL VESSEL  12/19/2020   IR ANGIOGRAM SELECTIVE EACH ADDITIONAL VESSEL  12/19/2020   IR ANGIOGRAM VISCERAL SELECTIVE  12/19/2020   IR EMBO ART  VEN HEMORR LYMPH EXTRAV  INC GUIDE ROADMAPPING  12/19/2020   IR US GUIDE VASC ACCESS RIGHT  12/19/2020   JOINT REPLACEMENT     STAPEDES SURGERY Left    "scraped stapedes because it was sticking when it wasn't suppose to"   [place] Left ~ 2008   TOTAL KNEE ARTHROPLASTY  12/20/2011   Procedure: TOTAL KNEE ARTHROPLASTY;  Surgeon: Augustin Schooling, MD;  Location: [place];  Service: Orthopedics;  Laterality: Right;  Right Total Knee Arthroplasty   TUBAL LIGATION  1980's     OB History   No obstetric history on file.     Family History  Problem Relation Age of Onset   Heart attack Mother    Heart disease Father    Heart failure Father    Bladder Cancer Father    Supraventricular tachycardia Sister    Hypertension Sister     Social History   Tobacco Use   Smoking status: Former    Packs/day: 0.75    Years: 37.00    Pack years: 27.75    Types: Cigarettes    Quit date: 07/01/1992    Years since quitting: 28.8   Smokeless tobacco: Never  Vaping Use   Vaping Use: Never used  Substance Use Topics   Alcohol use: Yes     Alcohol/week: 7.0 standard drinks    Types: 7 Glasses of wine per week    Comment: 09/05/2017  "6 oz wine, 5 days/wk"   Drug use: No    Types: Hydrocodone    Home Medications Prior to Admission medications   Medication Sig Start Date End Date Taking? Authorizing Provider  acetaminophen (TYLENOL) 500 MG tablet Take 1,000 mg by mouth daily as needed for headache.    [provider]  albuterol (PROVENTIL HFA;VENTOLIN HFA) 108 (90 Base) MCG/ACT inhaler Inhale 2 puffs into the lungs daily as needed. For seasonal allergies . Use 2 puffs 3 times daily x 4 days then back to home regimen. Patient taking differently: Inhale 2 puffs into the lungs daily as needed for wheezing or shortness of breath. 08/10/17   Eugenie Filler, MD  ALPRAZolam Duanne Moron) 0.25 MG tablet Take 1 tablet (0.25 mg total) by mouth daily as needed for anxiety. Patient not taking: Reported on 02/02/2021 09/14/17   Aline August, MD  dorzolamide-timolol (COSOPT) 22.3-6.8 MG/ML ophthalmic solution Place 1 drop into both eyes 2 (two) times daily.    [provider]  DULoxetine (CYMBALTA) 20 MG capsule Take 1 capsule (20 mg total) by mouth daily. Patient not taking: No sig reported 12/23/20   Nita Sells, MD  HYDROcodone-acetaminophen (NORCO/VICODIN) 5-325 MG tablet Take 1 tablet by mouth every 6 (six) hours as needed. 12/25/20   [provider]  latanoprost (XALATAN) 0.005 % ophthalmic solution Place 1 drop into both eyes at bedtime. 02/13/18   [provider]  loratadine (CLARITIN) 10 MG tablet Take 1 tablet (10 mg total) by mouth daily. 08/11/17   Eugenie Filler, MD  Multiple Vitamin (MULTIVITAMIN WITH MINERALS) TABS Take 1 tablet by mouth daily.    [provider]  nitrofurantoin, macrocrystal-monohydrate, (MACROBID) 100 MG capsule Take 100 mg by mouth See admin instructions. hs x 7 days Patient not taking: No sig reported 01/15/21   [provider]  omeprazole  (PRILOSEC) 20 MG capsule Take 20 mg by mouth every evening.    [provider]  OXYGEN 2lpm with sleep only  [employer]    [provider]  polyethylene glycol (MIRALAX / GLYCOLAX) packet Take 8.5 g by mouth daily.    [provider]  rOPINIRole (REQUIP) 0.5 MG tablet Take 1 tablet (0.5 mg total) by mouth at bedtime. Patient taking differently: Take 0.5 mg by mouth at bedtime as needed (restless legs). 12/23/20   Nita Sells, MD  sertraline (ZOLOFT) 100 MG tablet Take 1 tablet (100 mg total) by mouth daily. 12/23/20   Nita Sells,  MD  simvastatin (ZOCOR) 20 MG tablet Take 20 mg by mouth daily at 6 PM.    [provider]  sulfamethoxazole-trimethoprim (BACTRIM DS) 800-160 MG tablet Take 1 tablet by mouth See admin instructions. Bid x 7 days 01/24/21   [provider]    Allergies    Morphine and related, Abaloparatide, Aspirin, Duloxetine hcl, Ventolin [albuterol], and Cefadroxil  Review of Systems   Review of Systems  Gastrointestinal:  Positive for blood in stool.  All other systems reviewed and are negative.  Physical Exam Updated Vital Signs BP (!) 140/52 (BP Location: Left Arm)   Pulse 88   Temp 98.2 F (36.8 C) (Oral)   Resp 20   SpO2 95%   Physical Exam Vitals and nursing note reviewed.  Constitutional:      Appearance: She is well-developed.     Comments: Elderly, pale  HENT:     Head: Normocephalic and atraumatic.  Eyes:     Conjunctiva/sclera: Conjunctivae normal.     Pupils: Pupils are equal, round, and reactive to light.  Cardiovascular:     Rate and Rhythm: Normal rate and regular rhythm.     Heart sounds: Normal heart sounds.  Pulmonary:     Effort: Pulmonary effort is normal.     Breath sounds: Normal breath sounds.  Abdominal:     General: Bowel sounds are normal.     Palpations: Abdomen is soft.  Genitourinary:    Comments: Exam chaperoned by NT Pilar Plate melena on DRE Musculoskeletal:         General: Normal range of motion.     Cervical back: Normal range of motion.  Skin:    General: Skin is warm and dry.  Neurological:     Mental Status: She is alert and oriented to person, place, and time.    ED Results / Procedures / Treatments   Labs (all labs ordered are listed, but only abnormal results are displayed) Labs Reviewed  COMPREHENSIVE METABOLIC PANEL - Abnormal; Notable for the following components:      Result Value   Sodium 134 (*)    Glucose, Bld 137 (*)    Calcium 8.7 (*)    Total Protein 6.2 (*)    Albumin 3.4 (*)    All other components within normal limits  CBC - Abnormal; Notable for the following components:   RBC 2.50 (*)    Hemoglobin 5.8 (*)    HCT 19.5 (*)    MCV 78.0 (*)    MCH 23.2 (*)    MCHC 29.7 (*)    RDW 19.0 (*)    All other components within normal limits  POC OCCULT BLOOD, ED - Abnormal; Notable for the following components:   Fecal Occult Bld POSITIVE (*)    All other components within normal limits  RESP PANEL BY RT-PCR (FLU A&B, COVID) ARPGX2  TYPE AND SCREEN  PREPARE RBC (CROSSMATCH)    EKG None  Radiology No results found.  Procedures Procedures   CRITICAL CARE Performed by: Larene Pickett   Total critical care time: 45 minutes  Critical care time was exclusive of separately billable procedures and treating other patients.  Critical care was necessary to treat or prevent imminent or life-threatening deterioration.  Critical care was time spent personally by me on the following activities: development of treatment plan with patient and/or surrogate as well as nursing, discussions with consultants, evaluation of patient's response to treatment, examination of patient, obtaining history from patient or surrogate, ordering and  performing treatments and interventions, ordering and review of laboratory studies, ordering and review of radiographic studies, pulse oximetry and re-evaluation of patient's  condition.   Medications Ordered in ED Medications  0.9 %  sodium chloride infusion (has no administration in time range)    ED Course  I have reviewed the triage vital signs and the nursing notes.  Pertinent labs & imaging results that were available during my care of the patient were reviewed by me and considered in my medical decision making (see chart for details).    MDM Rules/Calculators/A&P                           80 y.o. F here with GI bleeding.  States she feels very weak, tired, and SOB after several dark bowel movements.  Hx of same in the past.  Labs from triage with hemoglobin of 5.8, baseline is around 9-10 range.  Frank melena on DRE, hemoccult +.  Will transfuse 2 units, start protonix drip.  Will require admission.  Discussed with Dr. Hal Hope-- will admit.  Secure message sent to on call [facility] for AM consult.  Final Clinical Impression(s) / ED Diagnoses Final diagnoses:  Acute GI bleeding    Rx / DC Orders ED Discharge Orders     None        Larene Pickett, PA-C 05/02/21 0055    Maudie Flakes, MD 05/02/21 [phone]

## 2021-05-02 NOTE — ED Notes (Signed)
Repeat EKG in the system. MD notified.

## 2021-05-03 ENCOUNTER — Observation Stay (HOSPITAL_COMMUNITY): Payer: Medicare HMO

## 2021-05-03 DIAGNOSIS — R1319 Other dysphagia: Secondary | ICD-10-CM | POA: Diagnosis not present

## 2021-05-03 DIAGNOSIS — K224 Dyskinesia of esophagus: Secondary | ICD-10-CM | POA: Diagnosis not present

## 2021-05-03 DIAGNOSIS — I10 Essential (primary) hypertension: Secondary | ICD-10-CM | POA: Diagnosis not present

## 2021-05-03 DIAGNOSIS — J440 Chronic obstructive pulmonary disease with acute lower respiratory infection: Secondary | ICD-10-CM | POA: Diagnosis not present

## 2021-05-03 DIAGNOSIS — R1310 Dysphagia, unspecified: Secondary | ICD-10-CM | POA: Diagnosis not present

## 2021-05-03 DIAGNOSIS — J9811 Atelectasis: Secondary | ICD-10-CM | POA: Diagnosis not present

## 2021-05-03 DIAGNOSIS — R0609 Other forms of dyspnea: Secondary | ICD-10-CM | POA: Diagnosis not present

## 2021-05-03 DIAGNOSIS — R933 Abnormal findings on diagnostic imaging of other parts of digestive tract: Secondary | ICD-10-CM | POA: Diagnosis not present

## 2021-05-03 DIAGNOSIS — J9622 Acute and chronic respiratory failure with hypercapnia: Secondary | ICD-10-CM | POA: Diagnosis not present

## 2021-05-03 DIAGNOSIS — K222 Esophageal obstruction: Secondary | ICD-10-CM | POA: Diagnosis not present

## 2021-05-03 DIAGNOSIS — Z6830 Body mass index (BMI) 30.0-30.9, adult: Secondary | ICD-10-CM | POA: Diagnosis not present

## 2021-05-03 DIAGNOSIS — R0902 Hypoxemia: Secondary | ICD-10-CM | POA: Diagnosis not present

## 2021-05-03 DIAGNOSIS — I509 Heart failure, unspecified: Secondary | ICD-10-CM | POA: Diagnosis not present

## 2021-05-03 DIAGNOSIS — F32A Depression, unspecified: Secondary | ICD-10-CM | POA: Diagnosis not present

## 2021-05-03 DIAGNOSIS — K317 Polyp of stomach and duodenum: Secondary | ICD-10-CM | POA: Diagnosis not present

## 2021-05-03 DIAGNOSIS — G9341 Metabolic encephalopathy: Secondary | ICD-10-CM | POA: Diagnosis not present

## 2021-05-03 DIAGNOSIS — H3530 Unspecified macular degeneration: Secondary | ICD-10-CM | POA: Diagnosis present

## 2021-05-03 DIAGNOSIS — R0602 Shortness of breath: Secondary | ICD-10-CM | POA: Diagnosis not present

## 2021-05-03 DIAGNOSIS — Z9981 Dependence on supplemental oxygen: Secondary | ICD-10-CM | POA: Diagnosis not present

## 2021-05-03 DIAGNOSIS — F419 Anxiety disorder, unspecified: Secondary | ICD-10-CM | POA: Diagnosis present

## 2021-05-03 DIAGNOSIS — J9621 Acute and chronic respiratory failure with hypoxia: Secondary | ICD-10-CM | POA: Diagnosis not present

## 2021-05-03 DIAGNOSIS — D62 Acute posthemorrhagic anemia: Secondary | ICD-10-CM | POA: Diagnosis not present

## 2021-05-03 DIAGNOSIS — E785 Hyperlipidemia, unspecified: Secondary | ICD-10-CM | POA: Diagnosis present

## 2021-05-03 DIAGNOSIS — J449 Chronic obstructive pulmonary disease, unspecified: Secondary | ICD-10-CM | POA: Diagnosis not present

## 2021-05-03 DIAGNOSIS — Z20822 Contact with and (suspected) exposure to covid-19: Secondary | ICD-10-CM | POA: Diagnosis not present

## 2021-05-03 DIAGNOSIS — H409 Unspecified glaucoma: Secondary | ICD-10-CM | POA: Diagnosis present

## 2021-05-03 DIAGNOSIS — M7989 Other specified soft tissue disorders: Secondary | ICD-10-CM | POA: Diagnosis not present

## 2021-05-03 DIAGNOSIS — K2100 Gastro-esophageal reflux disease with esophagitis, without bleeding: Secondary | ICD-10-CM | POA: Diagnosis not present

## 2021-05-03 DIAGNOSIS — E669 Obesity, unspecified: Secondary | ICD-10-CM | POA: Diagnosis present

## 2021-05-03 DIAGNOSIS — K922 Gastrointestinal hemorrhage, unspecified: Secondary | ICD-10-CM | POA: Diagnosis not present

## 2021-05-03 DIAGNOSIS — R0901 Asphyxia: Secondary | ICD-10-CM | POA: Diagnosis not present

## 2021-05-03 DIAGNOSIS — R69 Illness, unspecified: Secondary | ICD-10-CM | POA: Diagnosis not present

## 2021-05-03 DIAGNOSIS — G2581 Restless legs syndrome: Secondary | ICD-10-CM | POA: Diagnosis not present

## 2021-05-03 DIAGNOSIS — K921 Melena: Secondary | ICD-10-CM | POA: Diagnosis not present

## 2021-05-03 DIAGNOSIS — E860 Dehydration: Secondary | ICD-10-CM | POA: Diagnosis not present

## 2021-05-03 DIAGNOSIS — I272 Pulmonary hypertension, unspecified: Secondary | ICD-10-CM | POA: Diagnosis present

## 2021-05-03 DIAGNOSIS — R609 Edema, unspecified: Secondary | ICD-10-CM | POA: Diagnosis not present

## 2021-05-03 DIAGNOSIS — B3781 Candidal esophagitis: Secondary | ICD-10-CM | POA: Diagnosis not present

## 2021-05-03 DIAGNOSIS — R000 Tachycardia, unspecified: Secondary | ICD-10-CM | POA: Diagnosis not present

## 2021-05-03 DIAGNOSIS — J9601 Acute respiratory failure with hypoxia: Secondary | ICD-10-CM | POA: Diagnosis not present

## 2021-05-03 DIAGNOSIS — J690 Pneumonitis due to inhalation of food and vomit: Secondary | ICD-10-CM | POA: Diagnosis not present

## 2021-05-03 DIAGNOSIS — K5791 Diverticulosis of intestine, part unspecified, without perforation or abscess with bleeding: Secondary | ICD-10-CM | POA: Diagnosis not present

## 2021-05-03 DIAGNOSIS — K625 Hemorrhage of anus and rectum: Secondary | ICD-10-CM | POA: Diagnosis not present

## 2021-05-03 DIAGNOSIS — I517 Cardiomegaly: Secondary | ICD-10-CM | POA: Diagnosis not present

## 2021-05-03 DIAGNOSIS — J90 Pleural effusion, not elsewhere classified: Secondary | ICD-10-CM | POA: Diagnosis not present

## 2021-05-03 DIAGNOSIS — J441 Chronic obstructive pulmonary disease with (acute) exacerbation: Secondary | ICD-10-CM | POA: Diagnosis not present

## 2021-05-03 DIAGNOSIS — K219 Gastro-esophageal reflux disease without esophagitis: Secondary | ICD-10-CM | POA: Diagnosis present

## 2021-05-03 DIAGNOSIS — R911 Solitary pulmonary nodule: Secondary | ICD-10-CM | POA: Diagnosis not present

## 2021-05-03 LAB — FERRITIN: Ferritin: 17 ng/mL (ref 11–307)

## 2021-05-03 LAB — IRON AND TIBC
Iron: 26 ug/dL — ABNORMAL LOW (ref 28–170)
Saturation Ratios: 6 % — ABNORMAL LOW (ref 10.4–31.8)
TIBC: 406 ug/dL (ref 250–450)
UIBC: 380 ug/dL

## 2021-05-03 LAB — CBC WITH DIFFERENTIAL/PLATELET
Abs Immature Granulocytes: 0.05 10*3/uL (ref 0.00–0.07)
Basophils Absolute: 0 10*3/uL (ref 0.0–0.1)
Basophils Relative: 1 %
Eosinophils Absolute: 0.1 10*3/uL (ref 0.0–0.5)
Eosinophils Relative: 2 %
HCT: 25.8 % — ABNORMAL LOW (ref 36.0–46.0)
Hemoglobin: 8 g/dL — ABNORMAL LOW (ref 12.0–15.0)
Immature Granulocytes: 1 %
Lymphocytes Relative: 11 %
Lymphs Abs: 0.8 10*3/uL (ref 0.7–4.0)
MCH: 24.8 pg — ABNORMAL LOW (ref 26.0–34.0)
MCHC: 31 g/dL (ref 30.0–36.0)
MCV: 80.1 fL (ref 80.0–100.0)
Monocytes Absolute: 0.9 10*3/uL (ref 0.1–1.0)
Monocytes Relative: 12 %
Neutro Abs: 5.5 10*3/uL (ref 1.7–7.7)
Neutrophils Relative %: 73 %
Platelets: 166 10*3/uL (ref 150–400)
RBC: 3.22 MIL/uL — ABNORMAL LOW (ref 3.87–5.11)
RDW: 18.4 % — ABNORMAL HIGH (ref 11.5–15.5)
WBC: 7.4 10*3/uL (ref 4.0–10.5)
nRBC: 0.3 % — ABNORMAL HIGH (ref 0.0–0.2)

## 2021-05-03 LAB — CBC
HCT: 26.7 % — ABNORMAL LOW (ref 36.0–46.0)
Hemoglobin: 8.5 g/dL — ABNORMAL LOW (ref 12.0–15.0)
MCH: 25.5 pg — ABNORMAL LOW (ref 26.0–34.0)
MCHC: 31.8 g/dL (ref 30.0–36.0)
MCV: 80.2 fL (ref 80.0–100.0)
Platelets: 181 10*3/uL (ref 150–400)
RBC: 3.33 MIL/uL — ABNORMAL LOW (ref 3.87–5.11)
RDW: 18.8 % — ABNORMAL HIGH (ref 11.5–15.5)
WBC: 7.8 10*3/uL (ref 4.0–10.5)
nRBC: 0 % (ref 0.0–0.2)

## 2021-05-03 LAB — COMPREHENSIVE METABOLIC PANEL
ALT: 11 U/L (ref 0–44)
AST: 18 U/L (ref 15–41)
Albumin: 2.9 g/dL — ABNORMAL LOW (ref 3.5–5.0)
Alkaline Phosphatase: 52 U/L (ref 38–126)
Anion gap: 4 — ABNORMAL LOW (ref 5–15)
BUN: <5 mg/dL — ABNORMAL LOW (ref 8–23)
CO2: 29 mmol/L (ref 22–32)
Calcium: 8.4 mg/dL — ABNORMAL LOW (ref 8.9–10.3)
Chloride: 104 mmol/L (ref 98–111)
Creatinine, Ser: 0.46 mg/dL (ref 0.44–1.00)
GFR, Estimated: >60 mL/min (ref >60)
Glucose, Bld: 106 mg/dL — ABNORMAL HIGH (ref 70–99)
Potassium: 3.6 mmol/L (ref 3.5–5.1)
Sodium: 137 mmol/L (ref 135–145)
Total Bilirubin: 0.6 mg/dL (ref 0.3–1.2)
Total Protein: 5.2 g/dL — ABNORMAL LOW (ref 6.5–8.1)

## 2021-05-03 LAB — TYPE AND SCREEN
ABO/RH(D): O POS
Antibody Screen: POSITIVE
DAT, IgG: POSITIVE
Unit division: 0
Unit division: 0

## 2021-05-03 LAB — FOLATE: Folate: 33.2 ng/mL (ref >5.9)

## 2021-05-03 LAB — BPAM RBC
Blood Product Expiration Date: 202211212359
Blood Product Expiration Date: 202211282359
ISSUE DATE / TIME: 202211020202
ISSUE DATE / TIME: 202211020513
Unit Type and Rh: 5100
Unit Type and Rh: 5100

## 2021-05-03 LAB — VITAMIN B12: Vitamin B-12: 524 pg/mL (ref 180–914)

## 2021-05-03 LAB — PHOSPHORUS: Phosphorus: 3.7 mg/dL (ref 2.5–4.6)

## 2021-05-03 LAB — PROTIME-INR
INR: 1.2 (ref 0.8–1.2)
Prothrombin Time: 14.8 seconds (ref 11.4–15.2)

## 2021-05-03 LAB — MAGNESIUM: Magnesium: 2.1 mg/dL (ref 1.7–2.4)

## 2021-05-03 MED ORDER — SODIUM CHLORIDE 0.9 % IV SOLN
250.0000 mg | Freq: Once | INTRAVENOUS | Status: AC
  Administered 2021-05-03: 250 mg via INTRAVENOUS
  Filled 2021-05-03: qty 20

## 2021-05-03 MED ORDER — PROPRANOLOL HCL 10 MG PO TABS
5.0000 mg | ORAL_TABLET | Freq: Two times a day (BID) | ORAL | Status: DC
  Administered 2021-05-03 – 2021-05-04 (×3): 5 mg via ORAL
  Filled 2021-05-03 (×4): qty 0.5

## 2021-05-03 MED ORDER — SIMVASTATIN 20 MG PO TABS
20.0000 mg | ORAL_TABLET | Freq: Every day | ORAL | Status: DC
  Administered 2021-05-03 – 2021-05-09 (×5): 20 mg via ORAL
  Filled 2021-05-03 (×7): qty 1

## 2021-05-03 MED ORDER — SODIUM CHLORIDE 0.9 % IV SOLN
3.0000 g | Freq: Three times a day (TID) | INTRAVENOUS | Status: AC
  Administered 2021-05-03 – 2021-05-07 (×14): 3 g via INTRAVENOUS
  Filled 2021-05-03 (×16): qty 8

## 2021-05-03 NOTE — Progress Notes (Signed)
PROGRESS NOTE    [name]  CXK:[id number] DOB: 12/30/1940 DOA: 05/01/2021 PCP: [name], MD   Brief Narrative: 80 year old with past medical history significant for COPD on home oxygen at night, hypertension admitted on June of this year for diverticular bleed underwent embolization of severe bleeding presents to the ER complaining of bloody stool in low hemoglobin. Patient hemoglobin was found to be 5.8, she received 2 unit of packed red blood cells in the ED.  GI has been consulted.  Her hemoglobin has remained stable.  Her bleeding appears to have stopped.  Patient today was noted to be hypoxic on room air.  She is usually on 2 L of oxygen only at night. She was found to be tachycardic with heart rate in the 130s, oxygen saturation 70 on room air.  She was placed on 2 3 L of oxygen with improvement of her oxygen saturation.  Chest x-ray showed basilar chest density bilaterally right side greater than left.  Findings raise concern for pneumonia.  Plan to observe patient overnight, start IV antibiotics.  Might need to arrange home oxygen   Assessment & Plan:   Principal Problem:   Acute GI bleeding Active Problems:   COPD GOLD II    Acute blood loss anemia   1-Acute blood loss anemia, acute GI bleed: Patient presented with a hemoglobin of 5.6.  She has received 2 units of packed red blood cell. Globin has remained stable at 8. Plan to proceed with IV iron infusion today. She had a small bowel movement today with old blood.  GI has signed off. If she has any recurrent GI bleed she may need a stat CTA.  2-Acute on chronic hypoxic respiratory failure.  Hypoxemia on room air this am.   Patient is 2 L of oxygen at bedtime. Oxygen down to 74 RA  X-ray: Show bilateral density worse on the right concern for pneumonia. I have started IV antibiotics. Will need to arrange home oxygen for during the day Plan to observe overnight  3-COPD on nocturnal oxygen: Resume nebulizer as  needed Hypertension/ tachycardia; Resume propanolol/  RLS: Continue with ropinirole Depression: Continue with Zoloft    Pressure Injury 12/20/20 Elbow Anterior;Right (Active)  12/20/20 1300  Location: Elbow  Location Orientation: Anterior;Right  Staging:   Wound Description (Comments):   Present on Admission:       Estimated body mass index is 30.9 kg/m as calculated from the following:   Height as of 02/01/21: 5\' 4"  (1.626 m).   Weight as of 02/01/21: 81.6 kg.   DVT prophylaxis: SCD Code Status: Full code Family Communication:care discussed with patient Disposition Plan:  Status is: Observation  The patient remains OBS appropriate and will d/c before 2 midnights.       Consultants:  GI  Procedures:  None  Antimicrobials:  Unasyn 05/03/2021  Subjective: She is alert, she was notice to be hypoxic this am Oxygen at 70 % RA. Tachycardic HR 120 on ambulation.  She had BM yesterday with small amount of blood.  She denies dyspnea.   Objective: Vitals:   05/03/21 0416 05/03/21 0800 05/03/21 0802 05/03/21 0825  BP: (!) 116/59 112/62  (!) 119/56  Pulse: 92 (!) 101 100 100  Resp: 17 (!) 24  20  Temp: 98.4 F (36.9 C) 98.4 F (36.9 C)  98.2 F (36.8 C)  TempSrc: Oral Oral  Oral  SpO2: 90% (!) 74% 94% 94%    Intake/Output Summary (Last 24 hours) at 05/03/2021 1010 Last  data filed at 05/03/2021 0830 Gross per 24 hour  Intake 324.95 ml  Output 200 ml  Net 124.95 ml   There were no vitals filed for this visit.  Examination:  General exam: Appears calm and comfortable  Respiratory system: BL ronchus, tachypnea.  Cardiovascular system: S1 & S2 heard, tachycardic Gastrointestinal system: Abdomen is nondistended, soft and nontender. No organomegaly or masses felt. Normal bowel sounds heard. Central nervous system: Alert and oriented. No focal neurological deficits. Extremities: no edema   Data Reviewed: I have personally reviewed following labs and imaging  studies  CBC: Recent Labs  Lab 05/01/21 1839 05/02/21 0416 05/02/21 0930 05/02/21 1622 05/03/21 0048  WBC 7.4 6.9 6.2  --  7.4  NEUTROABS  --   --   --   --  5.5  HGB 5.8* 6.1* 8.2* 8.5* 8.0*  HCT 19.5* 20.6* 25.2* 26.7* 25.8*  MCV 78.0* 78.9* 78.8*  --  80.1  PLT 216 195 160  --  944   Basic Metabolic Panel: Recent Labs  Lab 05/01/21 1839 05/02/21 0416 05/02/21 0930 05/02/21 1603 05/03/21 0048  NA 134* 134* 138  --  137  K 4.1 3.7 3.6  --  3.6  CL 102 103 104  --  104  CO2 25 27 27   --  29  GLUCOSE 137* 112* 105*  --  106*  BUN 11 7* 5*  --  <5*  CREATININE 0.46 0.46 0.46  --  0.46  CALCIUM 8.7* 8.2* 8.5*  --  8.4*  MG  --   --   --  2.0 2.1  PHOS  --   --   --   --  3.7   GFR: CrCl cannot be calculated (Unknown ideal weight.). Liver Function Tests: Recent Labs  Lab 05/01/21 1839 05/03/21 0048  AST 20 18  ALT 13 11  ALKPHOS 62 52  BILITOT 0.4 0.6  PROT 6.2* 5.2*  ALBUMIN 3.4* 2.9*   No results for input(s): LIPASE, AMYLASE in the last 168 hours. No results for input(s): AMMONIA in the last 168 hours. Coagulation Profile: Recent Labs  Lab 05/03/21 0048  INR 1.2   Cardiac Enzymes: No results for input(s): CKTOTAL, CKMB, CKMBINDEX, TROPONINI in the last 168 hours. BNP (last 3 results) No results for input(s): PROBNP in the last 8760 hours. HbA1C: No results for input(s): HGBA1C in the last 72 hours. CBG: No results for input(s): GLUCAP in the last 168 hours. Lipid Profile: No results for input(s): CHOL, HDL, LDLCALC, TRIG, CHOLHDL, LDLDIRECT in the last 72 hours. Thyroid Function Tests: No results for input(s): TSH, T4TOTAL, FREET4, T3FREE, THYROIDAB in the last 72 hours. Anemia Panel: Recent Labs    05/03/21 0048  VITAMINB12 524  FOLATE 33.2  FERRITIN 17  TIBC 406  IRON 26*   Sepsis Labs: No results for input(s): PROCALCITON, LATICACIDVEN in the last 168 hours.  Recent Results (from the past 240 hour(s))  Resp Panel by RT-PCR (Flu  A&B, Covid) Nasopharyngeal Swab     Status: None   Collection Time: 05/02/21 12:28 AM   Specimen: Nasopharyngeal Swab; Nasopharyngeal(NP) swabs in vial transport medium  Result Value Ref Range Status   SARS Coronavirus 2 by RT PCR NEGATIVE NEGATIVE Final    Comment: (NOTE) SARS-CoV-2 target nucleic acids are NOT DETECTED.  The SARS-CoV-2 RNA is generally detectable in upper respiratory specimens during the acute phase of infection. The lowest concentration of SARS-CoV-2 viral copies this assay can detect is 138 copies/mL. A negative result  does not preclude SARS-Cov-2 infection and should not be used as the sole basis for treatment or other patient management decisions. A negative result may occur with  improper specimen collection/handling, submission of specimen other than nasopharyngeal swab, presence of viral mutation(s) within the areas targeted by this assay, and inadequate number of viral copies(<138 copies/mL). A negative result must be combined with clinical observations, patient history, and epidemiological information. The expected result is Negative.  Fact Sheet for Patients:  EntrepreneurPulse.com.au  Fact Sheet for Healthcare Providers:  IncredibleEmployment.be  This test is no t yet approved or cleared by the [place] FDA and  has been authorized for detection and/or diagnosis of SARS-CoV-2 by FDA under an Emergency Use Authorization (EUA). This EUA will remain  in effect (meaning this test can be used) for the duration of the COVID-19 declaration under Section 564(b)(1) of the Act, 21 U.S.C.section 360bbb-3(b)(1), unless the authorization is terminated  or revoked sooner.       Influenza A by PCR NEGATIVE NEGATIVE Final   Influenza B by PCR NEGATIVE NEGATIVE Final    Comment: (NOTE) The Xpert Xpress SARS-CoV-2/FLU/RSV plus assay is intended as an aid in the diagnosis of influenza from Nasopharyngeal swab specimens  and should not be used as a sole basis for treatment. Nasal washings and aspirates are unacceptable for Xpert Xpress SARS-CoV-2/FLU/RSV testing.  Fact Sheet for Patients: EntrepreneurPulse.com.au  Fact Sheet for Healthcare Providers: IncredibleEmployment.be  This test is not yet approved or cleared by the [place] FDA and has been authorized for detection and/or diagnosis of SARS-CoV-2 by FDA under an Emergency Use Authorization (EUA). This EUA will remain in effect (meaning this test can be used) for the duration of the COVID-19 declaration under Section 564(b)(1) of the Act, 21 U.S.C. section 360bbb-3(b)(1), unless the authorization is terminated or revoked.  Performed at [facility], [place] [address] [place], [place] [address]          Radiology Studies: No results found.      Scheduled Meds:  dorzolamide-timolol  1 drop Both Eyes BID   latanoprost  1 drop Both Eyes QHS   ondansetron  4 mg Oral Q12H   [START ON 05/05/2021] pantoprazole  40 mg Intravenous Q12H   rOPINIRole  0.5 mg Oral QHS   sertraline  150 mg Oral Daily   Continuous Infusions:  sodium chloride     ferric gluconate (FERRLECIT) IVPB     pantoprazole 8 mg/hr (05/03/21 0851)     LOS: 0 days    Time spent: 35 minutes.     Elmarie Shiley, MD [facility]   If 7PM-7AM, please contact night-coverage [email or web address]  05/03/2021, 10:10 AM

## 2021-05-03 NOTE — Progress Notes (Signed)
SATURATION QUALIFICATIONS: (This note is used to comply with regulatory documentation for home oxygen)  Patient Saturations on Room Air at Rest = 93% on 2L nasal cannula  Patient Saturations on Room Air while Ambulating = 70%  Patient Saturations on 2 Liters of oxygen while Ambulating = 92-94%

## 2021-05-03 NOTE — TOC Initial Note (Addendum)
Transition of Care [facility]) - Initial/Assessment Note    Patient Details  Name: [name] MRN: [id number] Date of Birth: October 17, 1940  Transition of [facility]) CM/SW Contact:    Marilu Favre, RN Phone Number: 05/03/2021, 1:03 PM  Clinical Narrative:                  Confirmed face sheet information with patient. Patient from home with husband. Patient has [facility] home health and would like to continue services with them. NCM confirmed with Stacie with [facility] patient is active for HHPT and [place]. WIll need orders to resume care.   Patient  has Rollator, rolling walker , transport chair and oxygen at home already.   Prior to admission patient just used oxygen at night so she does not have a portable tank. If patient needs oxygen continuous at discharge will need orders and update her oxygen agency. Patient unsure of oxygen agency name, states her husband would know. NCM offered to call husband. Patient declined , states he is planning to visit this afternoon and she will ask him at that time.   NCM will continue to follow.    51 MD ordered home oxygen, secure chatted MD for saturation note . Saturation note needs to say what liter flow she ambulated on and what her recovery saturation was. Spoke to [place] at [place] they provide home oxygen. Verified note needs to include that information    1450 Oxygen note and order in [facility] , Terri at [facility] aware discharge planned for tomorrow.   Husband at bedside. Husband and patient updated on home health, home oxygen and discharge planned for tomorrow. Husband confirmed he will transport patient home in car. Expected Discharge Plan: [place] Barriers to Discharge: Continued Medical Work up   Patient Goals and CMS Choice Patient states their goals for this hospitalization and ongoing recovery are:: to return to home CMS Medicare.gov Compare Post Acute Care list provided to:: Patient Choice offered to / list  presented to : Patient  Expected Discharge Plan and Services Expected Discharge Plan: [place]   Discharge Planning Services: CM Consult Post Acute Care Choice: [place] arrangements for the past 2 months: Single Family Home                 DME Arranged: N/A DME Agency: NA       HH Arranged: PT, RN [place] Agency: [place] Date [place]: 05/03/21 Time [place]: 1302 Representative spoke with at [place]: [place] Arrangements/Services Living arrangements for the past 2 months: [place] with:: Spouse Patient language and need for interpreter reviewed:: Yes Do you feel safe going back to the place where you live?: Yes      Need for Family Participation in Patient Care: Yes (Comment) Care giver support system in place?: Yes (comment) Current home services: DME Criminal Activity/Legal Involvement Pertinent to Current Situation/Hospitalization: No - Comment as needed  Activities of Daily Living   ADL Screening (condition at time of admission) Patient's cognitive ability adequate to safely complete daily activities?: Yes Is the patient deaf or have difficulty hearing?: Yes Does the patient have difficulty seeing, even when wearing glasses/contacts?: No Does the patient have difficulty concentrating, remembering, or making decisions?: No Patient able to express need for assistance with ADLs?: Yes Does the patient have difficulty dressing or bathing?: No Independently performs ADLs?: No Communication: Independent Dressing (OT): Needs assistance  Is this a change from baseline?: Pre-admission baseline Grooming: Independent Feeding: Independent Bathing: Needs assistance Is this a change from baseline?: Pre-admission baseline Toileting: Independent with device (comment) In/Out Bed: Independent with device (comment) Walks in Home: Independent with device (comment) Does the patient have  difficulty walking or climbing stairs?: Yes Weakness of Legs: None Weakness of Arms/Hands: None  Permission Sought/Granted   Permission granted to share information with : No              Emotional Assessment Appearance:: Appears stated age Attitude/Demeanor/Rapport: Engaged Affect (typically observed): Accepting Orientation: : Oriented to Self, Oriented to Place, Oriented to  Time, Oriented to Situation Alcohol / Substance Use: Not Applicable Psych Involvement: No (comment)  Admission diagnosis:  Acute GI bleeding [K92.2] Patient Active Problem List   Diagnosis Date Noted   Acute blood loss anemia 05/02/2021   Symptomatic anemia    DOE (dyspnea on exertion) 11/18/2017   COPD GOLD II  11/18/2017   Diverticulosis of intestine with bleeding 09/05/2017   Influenza A 08/08/2017   Acute respiratory failure with hypoxia ([place]) 08/07/2017   Depression 08/07/2017   Hyperlipemia 08/07/2017   Macular degeneration 08/07/2017   Glaucoma 08/07/2017   Weakness 08/07/2017   Dehydration 08/07/2017   Lower GI bleeding 10/14/2016   Diverticulosis of colon 10/14/2016   Anxiety 10/14/2016   Lower GI bleed 10/14/2016   Acute GI bleeding    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

## 2021-05-03 NOTE — Progress Notes (Signed)
Physical Therapy Treatment Patient Details Name: [name] MRN: [id number] DOB: 12-06-1940 Today's Date: 05/03/2021   History of Present Illness 80 y.o. female presents to [facility] on [facility]/01/2021 with low hemoglobin and ongoing rectal bleeding. PMH includes COPD on home oxygen, hypertension.    PT Comments    Pt with increased ambulation. Pt with SpO2 70% on RA. Replaced O2 at 2L with return SpO2 >90% after 90 seconds. Pt also with HR to 138 with amb.    Recommendations for follow up therapy are one component of a multi-disciplinary discharge planning process, led by the attending physician.  Recommendations may be updated based on patient status, additional functional criteria and insurance authorization.  Follow Up Recommendations  Home health PT     Assistance Recommended at Discharge Intermittent Supervision/Assistance  Equipment Recommendations  None recommended by PT    Recommendations for Other Services       Precautions / Restrictions Precautions Precautions: Other (comment) Precaution Comments: Very poor vision. Watch SpO2 and HR. Restrictions Weight Bearing Restrictions: No     Mobility  Bed Mobility Overal bed mobility: Needs Assistance Bed Mobility: Supine to Sit;Sit to Supine     Supine to sit: Supervision;HOB elevated Sit to supine: Min guard;HOB elevated   General bed mobility comments: Incr time and effort to perform    Transfers Overall transfer level: Needs assistance Equipment used: Rollator (4 wheels) Transfers: Sit to/from Stand Sit to Stand: Min guard           General transfer comment: Assist for safety and lines    Ambulation/Gait Ambulation/Gait assistance: Min guard Gait Distance (Feet): 150 Feet Assistive device: Rollator (4 wheels) Gait Pattern/deviations: Step-through pattern;Decreased stride length Gait velocity: decr Gait velocity interpretation: <1.8 ft/sec, indicate of risk for recurrent falls General Gait Details:  Assist for safety and for navigation due to pt with very poor vision and unable to see and avoid obstacles   Stairs             Wheelchair Mobility    Modified Rankin (Stroke Patients Only)       Balance Overall balance assessment: Needs assistance Sitting-balance support: No upper extremity supported;Feet supported Sitting balance-Leahy Scale: Good     Standing balance support: Single extremity supported;During functional activity Standing balance-Leahy Scale: Poor Standing balance comment: UE support and min guard for static standing                            Cognition Arousal/Alertness: Awake/alert Behavior During Therapy: WFL for tasks assessed/performed Overall Cognitive Status: Within Functional Limits for tasks assessed                                          Exercises      General Comments General comments (skin integrity, edema, etc.): Pt with HR to 138 with amb. SpO2 70% on RA with amb. Replaced O2 with SpO2 returning to >90%      Pertinent Vitals/Pain Pain Assessment: No/denies pain    Home Living                          Prior Function            PT Goals (current goals can now be found in the care plan section) Acute Rehab PT Goals Patient Stated Goal: to  return to prior level of function Progress towards PT goals: Progressing toward goals    Frequency    Min 3X/week      PT Plan Current plan remains appropriate    Co-evaluation              AM-PAC PT "6 Clicks" Mobility   Outcome Measure  Help needed turning from your back to your side while in a flat bed without using bedrails?: A Little Help needed moving from lying on your back to sitting on the side of a flat bed without using bedrails?: A Little Help needed moving to and from a bed to a chair (including a wheelchair)?: A Little Help needed standing up from a chair using your arms (e.g., wheelchair or bedside chair)?: A  Little Help needed to walk in hospital room?: A Little Help needed climbing 3-5 steps with a railing? : A Little 6 Click Score: 18    End of Session Equipment Utilized During Treatment: Gait belt Activity Tolerance: Patient limited by fatigue Patient left: in bed;with call bell/phone within reach;with bed alarm set Nurse Communication: Mobility status;Other (comment) (SpO2) PT Visit Diagnosis: Unsteadiness on feet (R26.81);Other abnormalities of gait and mobility (R26.89);Muscle weakness (generalized) (M62.81)     Time: 4193-7902 PT Time Calculation (min) (ACUTE ONLY): 22 min  Charges:  $Gait Training: 8-22 mins                     [place] Pager [phone] Office [place] 05/03/2021, 10:59 AM

## 2021-05-03 NOTE — Progress Notes (Signed)
SATURATION QUALIFICATIONS: (This note is used to comply with regulatory documentation for home oxygen)  Patient Saturations on Room Air at Rest = 94%  Patient Saturations on Room Air while Ambulating = 70%  Patient Saturations on N/A Liters of oxygen while Ambulating = N/A%  Please briefly explain why patient needs home oxygen: Pt unable to maintain adequate oxygent levels without supplemental O2.   [place] Pager [phone] Office [phone]

## 2021-05-03 NOTE — Progress Notes (Signed)
Pharmacy Antibiotic Note  [name] is a 80 y.o. female admitted on 05/01/2021 with rectal bleeding.  Pharmacy has been consulted for Unasyn dosing for aspiration PNA.  SCr stable, afebrile, WBC WNL.  Plan: Unasyn 3gm IV Q8H Pharmacy will sign off with stable renal fxn.  Thank you for the consult!  Weight: 81.6 kg (179 lb 14.3 oz)  Temp (24hrs), Avg:98.3 F (36.8 C), Min:98 F (36.7 C), Max:98.6 F (37 C)  Recent Labs  Lab 05/01/21 1839 05/02/21 0416 05/02/21 0930 05/03/21 0048 05/03/21 1233  WBC 7.4 6.9 6.2 7.4 7.8  CREATININE 0.46 0.46 0.46 0.46  --     Estimated Creatinine Clearance: 56.1 mL/min (by C-G formula based on SCr of 0.46 mg/dL).    Allergies  Allergen Reactions   Morphine And Related Itching   Abaloparatide Other (See Comments)   Aspirin Other (See Comments)   Duloxetine Hcl Other (See Comments)   Ventolin [Albuterol]     Other reaction(s): rapid heart beat   Cefadroxil Hives    Patient can take amoxicillin and cipro    Unasyn 11/3 >>   Effie Wahlert D. Mina Marble, PharmD, BCPS, [place] 05/03/2021, 1:37 PM

## 2021-05-03 NOTE — Progress Notes (Signed)
Mobility Specialist Progress Note   05/03/21 1747  Mobility  Activity Ambulated in hall  Level of Assistance Minimal assist, patient does 75% or more  Assistive Device Four wheel walker  Distance Ambulated (ft) 200 ft  Mobility Ambulated with assistance in hallway  Mobility Response Tolerated well  Mobility performed by Mobility specialist  $Mobility charge 1 Mobility   Received pt laying in bed slightly lethargic but agreeable to mobility session. Pt minG for bed mobility but while sitting EOB pt desat to 85% SpO2 on 2LO2. Raised to 3LO2 and pt re-sat >93% SpO2. During ambulation pt tends to have a major forward bend and needs constant VC for correcting posture. x1 standing break d/t LLE fatigue, returned back to the BR so NT could give pt a bath.      Pre Mobility: 98HR, 88% SpO2 on 2LO2 During Mobility: 128 HR, 93% SpO2 on 3LO2 Post Mobility: 116 HR, 98% SpO2  [place] Mobility Specialist Phone Number [phone]

## 2021-05-03 NOTE — Progress Notes (Signed)
[facility] Progress Note  [name] 80 y.o. [birth year]  CC: Rectal bleeding   Subjective: Patient seen and examined at bedside.  Feeling better.  Denies any abdominal pain.  Had a small bowel movement today which contained old blood according to patient.  ROS : Febrile, negative for acute chest pain   Objective: Vital signs in last 24 hours: Vitals:   05/03/21 0802 05/03/21 0825  BP:  (!) 119/56  Pulse: 100 100  Resp:  20  Temp:  98.2 F (36.8 C)  SpO2: 94% 94%    Physical Exam:  General:  Elderly patient, not in acute  Head:  Normocephalic, without obvious abnormality, atraumatic  Eyes:  , EOM's intact,   Lungs:   Decreased breath sounds bilaterally  Heart:  Regular rate and rhythm,  Abdomen:   Soft, non-tender, nondistended, bowel sounds present  Neuro Alert and oriented x3       Lab Results: Recent Labs    05/02/21 0930 05/02/21 1603 05/03/21 0048  NA 138  --  137  K 3.6  --  3.6  CL 104  --  104  CO2 27  --  29  GLUCOSE 105*  --  106*  BUN 5*  --  <5*  CREATININE 0.46  --  0.46  CALCIUM 8.5*  --  8.4*  MG  --  2.0 2.1  PHOS  --   --  3.7   Recent Labs    05/01/21 1839 05/03/21 0048  AST 20 18  ALT 13 11  ALKPHOS 62 52  BILITOT 0.4 0.6  PROT 6.2* 5.2*  ALBUMIN 3.4* 2.9*   Recent Labs    05/02/21 0930 05/02/21 1622 05/03/21 0048  WBC 6.2  --  7.4  NEUTROABS  --   --  5.5  HGB 8.2* 8.5* 8.0*  HCT 25.2* 26.7* 25.8*  MCV 78.8*  --  80.1  PLT 160  --  166   Recent Labs    05/03/21 0048  LABPROT 14.8  INR 1.2      Assessment/Plan: -Rectal bleeding.  Most likely diverticular bleed.  Last episode of blood in the stool was on Monday.  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 -Acute blood loss anemia. -COPD on chronic oxygen supplements   Recommendations ------------------------- -Hemoglobin stable.  Had a small bowel movement today which contained old blood according to  patient. -Do not see need for any invasive or aggressive work-up at this time. - If she starts having overt bleeding again, recommend stat CT angio. -GI will sign off.  Call us back if needed   Otis Brace MD, [place] 05/03/2021, 10:20 AM  Contact #  [phone]

## 2021-05-04 ENCOUNTER — Inpatient Hospital Stay (HOSPITAL_COMMUNITY): Payer: Medicare HMO

## 2021-05-04 DIAGNOSIS — R0902 Hypoxemia: Secondary | ICD-10-CM

## 2021-05-04 DIAGNOSIS — D62 Acute posthemorrhagic anemia: Secondary | ICD-10-CM

## 2021-05-04 DIAGNOSIS — J449 Chronic obstructive pulmonary disease, unspecified: Secondary | ICD-10-CM | POA: Diagnosis not present

## 2021-05-04 DIAGNOSIS — K922 Gastrointestinal hemorrhage, unspecified: Secondary | ICD-10-CM

## 2021-05-04 LAB — CBC
HCT: 25.2 % — ABNORMAL LOW (ref 36.0–46.0)
HCT: 26.7 % — ABNORMAL LOW (ref 36.0–46.0)
HCT: 26.8 % — ABNORMAL LOW (ref 36.0–46.0)
Hemoglobin: 7.6 g/dL — ABNORMAL LOW (ref 12.0–15.0)
Hemoglobin: 8.1 g/dL — ABNORMAL LOW (ref 12.0–15.0)
Hemoglobin: 8.1 g/dL — ABNORMAL LOW (ref 12.0–15.0)
MCH: 25.1 pg — ABNORMAL LOW (ref 26.0–34.0)
MCH: 25.3 pg — ABNORMAL LOW (ref 26.0–34.0)
MCH: 25.3 pg — ABNORMAL LOW (ref 26.0–34.0)
MCHC: 30.2 g/dL (ref 30.0–36.0)
MCHC: 30.3 g/dL (ref 30.0–36.0)
MCHC: 30.2 g/dL (ref 30.0–36.0)
MCV: 83.2 fL (ref 80.0–100.0)
MCV: 83.4 fL (ref 80.0–100.0)
MCV: 83.8 fL (ref 80.0–100.0)
Platelets: 157 10*3/uL (ref 150–400)
Platelets: 163 10*3/uL (ref 150–400)
Platelets: 160 10*3/uL (ref 150–400)
RBC: 3.03 MIL/uL — ABNORMAL LOW (ref 3.87–5.11)
RBC: 3.2 MIL/uL — ABNORMAL LOW (ref 3.87–5.11)
RBC: 3.2 MIL/uL — ABNORMAL LOW (ref 3.87–5.11)
RDW: 19.3 % — ABNORMAL HIGH (ref 11.5–15.5)
RDW: 19.3 % — ABNORMAL HIGH (ref 11.5–15.5)
RDW: 19.2 % — ABNORMAL HIGH (ref 11.5–15.5)
WBC: 7.1 10*3/uL (ref 4.0–10.5)
WBC: 6.5 10*3/uL (ref 4.0–10.5)
WBC: 6.5 10*3/uL (ref 4.0–10.5)
nRBC: 0.6 % — ABNORMAL HIGH (ref 0.0–0.2)
nRBC: 0.3 % — ABNORMAL HIGH (ref 0.0–0.2)
nRBC: 0.3 % — ABNORMAL HIGH (ref 0.0–0.2)

## 2021-05-04 LAB — BLOOD GAS, ARTERIAL
Acid-Base Excess: 5.9 mmol/L — ABNORMAL HIGH (ref 0.0–2.0)
Acid-Base Excess: 6.7 mmol/L — ABNORMAL HIGH (ref 0.0–2.0)
Bicarbonate: 31.8 mmol/L — ABNORMAL HIGH (ref 20.0–28.0)
Bicarbonate: 32 mmol/L — ABNORMAL HIGH (ref 20.0–28.0)
Drawn by: 51185
FIO2: 28
FIO2: 30
O2 Saturation: 97 %
O2 Saturation: 93.9 %
Patient temperature: 37
Patient temperature: 37
pCO2 arterial: 64.4 mmHg — ABNORMAL HIGH (ref 32.0–48.0)
pCO2 arterial: 58 mmHg — ABNORMAL HIGH (ref 32.0–48.0)
pH, Arterial: 7.314 — ABNORMAL LOW (ref 7.350–7.450)
pH, Arterial: 7.36 (ref 7.350–7.450)
pO2, Arterial: 88.6 mmHg (ref 83.0–108.0)
pO2, Arterial: 67.5 mmHg — ABNORMAL LOW (ref 83.0–108.0)

## 2021-05-04 LAB — BASIC METABOLIC PANEL
Anion gap: 6 (ref 5–15)
BUN: 6 mg/dL — ABNORMAL LOW (ref 8–23)
CO2: 32 mmol/L (ref 22–32)
Calcium: 8.3 mg/dL — ABNORMAL LOW (ref 8.9–10.3)
Chloride: 98 mmol/L (ref 98–111)
Creatinine, Ser: 0.53 mg/dL (ref 0.44–1.00)
GFR, Estimated: >60 mL/min (ref >60)
Glucose, Bld: 96 mg/dL (ref 70–99)
Potassium: 3.8 mmol/L (ref 3.5–5.1)
Sodium: 136 mmol/L (ref 135–145)

## 2021-05-04 LAB — POCT I-STAT 7, (LYTES, BLD GAS, ICA,H+H)
Acid-Base Excess: 6 mmol/L — ABNORMAL HIGH (ref 0.0–2.0)
Bicarbonate: 32.9 mmol/L — ABNORMAL HIGH (ref 20.0–28.0)
Calcium, Ion: 1.25 mmol/L (ref 1.15–1.40)
HCT: 24 % — ABNORMAL LOW (ref 36.0–46.0)
Hemoglobin: 8.2 g/dL — ABNORMAL LOW (ref 12.0–15.0)
O2 Saturation: 92 %
Patient temperature: 100
Potassium: 3.6 mmol/L (ref 3.5–5.1)
Sodium: 137 mmol/L (ref 135–145)
TCO2: 35 mmol/L — ABNORMAL HIGH (ref 22–32)
pCO2 arterial: 61.4 mmHg — ABNORMAL HIGH (ref 32.0–48.0)
pH, Arterial: 7.341 — ABNORMAL LOW (ref 7.350–7.450)
pO2, Arterial: 73 mmHg — ABNORMAL LOW (ref 83.0–108.0)

## 2021-05-04 LAB — GLUCOSE, CAPILLARY
Glucose-Capillary: 121 mg/dL — ABNORMAL HIGH (ref 70–99)
Glucose-Capillary: 92 mg/dL (ref 70–99)
Glucose-Capillary: 83 mg/dL (ref 70–99)
Glucose-Capillary: 105 mg/dL — ABNORMAL HIGH (ref 70–99)

## 2021-05-04 LAB — MRSA NEXT GEN BY PCR, NASAL: MRSA by PCR Next Gen: NOT DETECTED

## 2021-05-04 MED ORDER — SODIUM CHLORIDE 0.9 % IV SOLN
INTRAVENOUS | Status: DC | PRN
  Administered 2021-05-07: 10 mL via INTRAVENOUS

## 2021-05-04 MED ORDER — IPRATROPIUM-ALBUTEROL 0.5-2.5 (3) MG/3ML IN SOLN
3.0000 mL | Freq: Once | RESPIRATORY_TRACT | Status: AC
  Administered 2021-05-04: 3 mL via RESPIRATORY_TRACT
  Filled 2021-05-04: qty 3

## 2021-05-04 MED ORDER — DEXTROSE 50 % IV SOLN: 25.0000 mL | Freq: Once | INTRAVENOUS | Status: DC

## 2021-05-04 MED ORDER — PANTOPRAZOLE SODIUM 40 MG PO TBEC: 40.0000 mg | DELAYED_RELEASE_TABLET | Freq: Two times a day (BID) | ORAL | Status: DC

## 2021-05-04 MED ORDER — PANTOPRAZOLE SODIUM 40 MG PO TBEC
40.0000 mg | DELAYED_RELEASE_TABLET | Freq: Two times a day (BID) | ORAL | Status: DC
  Administered 2021-05-04 – 2021-05-06 (×4): 40 mg via ORAL
  Filled 2021-05-04 (×4): qty 1

## 2021-05-04 MED ORDER — PROPRANOLOL HCL 10 MG PO TABS
5.0000 mg | ORAL_TABLET | Freq: Two times a day (BID) | ORAL | Status: DC | PRN
  Filled 2021-05-04: qty 0.5

## 2021-05-04 MED ORDER — METHYLPREDNISOLONE SODIUM SUCC 40 MG IJ SOLR
40.0000 mg | Freq: Two times a day (BID) | INTRAMUSCULAR | Status: DC
  Administered 2021-05-04 – 2021-05-06 (×4): 40 mg via INTRAVENOUS
  Filled 2021-05-04 (×4): qty 1

## 2021-05-04 MED ORDER — REVEFENACIN 175 MCG/3ML IN SOLN
175.0000 ug | Freq: Every day | RESPIRATORY_TRACT | Status: DC
  Administered 2021-05-05 – 2021-05-09 (×5): 175 ug via RESPIRATORY_TRACT
  Filled 2021-05-04 (×7): qty 3

## 2021-05-04 MED ORDER — POTASSIUM CHLORIDE 10 MEQ/100ML IV SOLN
10.0000 meq | INTRAVENOUS | Status: AC
  Administered 2021-05-05 (×4): 10 meq via INTRAVENOUS
  Filled 2021-05-04 (×4): qty 100

## 2021-05-04 MED ORDER — FUROSEMIDE 10 MG/ML IJ SOLN
20.0000 mg | Freq: Once | INTRAMUSCULAR | Status: AC
  Administered 2021-05-04: 20 mg via INTRAVENOUS
  Filled 2021-05-04: qty 2

## 2021-05-04 MED ORDER — CHLORHEXIDINE GLUCONATE CLOTH 2 % EX PADS
6.0000 | MEDICATED_PAD | Freq: Every day | CUTANEOUS | Status: DC
  Administered 2021-05-04 – 2021-05-10 (×7): 6 via TOPICAL

## 2021-05-04 MED ORDER — ARFORMOTEROL TARTRATE 15 MCG/2ML IN NEBU
15.0000 ug | INHALATION_SOLUTION | Freq: Two times a day (BID) | RESPIRATORY_TRACT | Status: DC
  Administered 2021-05-04 – 2021-05-10 (×12): 15 ug via RESPIRATORY_TRACT
  Filled 2021-05-04 (×12): qty 2

## 2021-05-04 NOTE — Progress Notes (Signed)
RT NOTE: RT transported patient on bipap from room 3E11 to room 2N19 with no complications. Vitals are stable. RT will continue to monitor.

## 2021-05-04 NOTE — Progress Notes (Signed)
Updated husband by phone [phone]) He requests call if any changes to patient's condition  Erskine Emery MD PCCM

## 2021-05-04 NOTE — Progress Notes (Addendum)
PROGRESS NOTE    [name]  LKG:[id number] DOB: 1941/02/09 DOA: 05/01/2021 PCP: [name], MD   Brief Narrative: 80 year old with past medical history significant for COPD on home oxygen at night, hypertension admitted on June of this year for diverticular bleed underwent embolization of severe bleeding presents to the ER complaining of bloody stool in low hemoglobin. Patient hemoglobin was found to be 5.8, she received 2 unit of packed red blood cells in the ED.  GI has been consulted. Her hemoglobin has remained stable.  Her bleeding appears to have stopped.  Patient today was noted to be hypoxic on room air.  She is usually on 2 L of oxygen only at night. She was found to be tachycardic with heart rate in the 130s, oxygen saturation 70 on room air.  She was placed on 2  L of oxygen with improvement of her oxygen saturation.  Chest x-ray showed basilar chest density bilaterally right side greater than left.  Findings raise concern for pneumonia. Started on IV antibiotics.   She is more lethargic today. ABG Ph 7.3 PCO2 68. Patient will be started on BIPAP/ transfer to progressive bed.   Assessment & Plan:   Principal Problem:   Acute GI bleeding Active Problems:   COPD GOLD II    Acute blood loss anemia   1-Acute blood loss anemia, acute GI bleed: Patient presented with a hemoglobin of 5.6.  She has received 2 units of packed red blood cell. Hb  has remained stable at 8. Continue with PPI BID.  GI has signed off. If she has any recurrent GI bleed she may need a stat CTA.  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  Hypoxemia on room air this am.   Patient is 2 L of oxygen at bedtime. Oxygen down to 74 RA  X-ray: Show bilateral density worse on the right concern for pneumonia. Continue with IV Unasyn/  More lethargic today. ABG Ph 7.3 PCO 64. Plan to placed on BIPAP, transfer to progressive bed.  Follow up on patient, continue to be sleepy. Repeated Blood  gas ABG PCO2 improved, oxygen decreased. Plan to repeated chest x ray and CCm / pulmonologist will see in consultation.   3-COPD on nocturnal oxygen: Resume nebulizer as needed Hypertension/ tachycardia; change  propanolol/ tp PRN. She uses at home PRN.  RLS: Continue with ropinirole Depression: Continue with Zoloft    Pressure Injury 12/20/20 Elbow Anterior;Right (Active)  12/20/20 1300  Location: Elbow  Location Orientation: Anterior;Right  Staging:   Wound Description (Comments):   Present on Admission:       Estimated body mass index is 30.88 kg/m as calculated from the following:   Height as of 02/01/21: 5\' 4"  (1.626 m).   Weight as of this encounter: 81.6 kg.   DVT prophylaxis: SCD Code Status: Full code Family Communication:care discussed with patient husband 11/04 Disposition Plan:  Status is: Observation  The patient remains OBS appropriate and will d/c before 2 midnights.       Consultants:  GI  Procedures:  None  Antimicrobials:  Unasyn 05/03/2021  Subjective: She had BM yesterday, no bloody.  She is very sleep today, keep eyes close.  Denies worsening dyspnea.   Objective: Vitals:   05/03/21 2014 05/04/21 0440 05/04/21 0738 05/04/21 0830  BP: 124/63 126/62 120/64 120/64  Pulse: 96 86 83   Resp: 19 17 19    Temp: 99.2 F (37.3 C) 99.1 F (37.3 C) 98.1 F (36.7 C)  TempSrc: Oral Oral Oral   SpO2: 94% 100% 97%   Weight:        Intake/Output Summary (Last 24 hours) at 05/04/2021 1419 Last data filed at 05/04/2021 1100 Gross per 24 hour  Intake 1041.22 ml  Output 800 ml  Net 241.22 ml    Filed Weights   05/03/21 1300  Weight: 81.6 kg    Examination:  General exam: Sleepy Respiratory system: BL air movement, less tachypnea.  Cardiovascular system: S 1, S 2 RRR Gastrointestinal system: BS present, soft, nt Central nervous system: sleepy  Extremities: No edema   Data Reviewed: I have personally reviewed following labs and  imaging studies  CBC: Recent Labs  Lab 05/02/21 0930 05/02/21 1622 05/03/21 0048 05/03/21 1233 05/04/21 0203 05/04/21 0947  WBC 6.2  --  7.4 7.8 7.1 6.5  NEUTROABS  --   --  5.5  --   --   --   HGB 8.2* 8.5* 8.0* 8.5* 7.6* 8.1*  HCT 25.2* 26.7* 25.8* 26.7* 25.2* 26.7*  MCV 78.8*  --  80.1 80.2 83.2 83.4  PLT 160  --  166 181 157 237    Basic Metabolic Panel: Recent Labs  Lab 05/01/21 1839 05/02/21 0416 05/02/21 0930 05/02/21 1603 05/03/21 0048  NA 134* 134* 138  --  137  K 4.1 3.7 3.6  --  3.6  CL 102 103 104  --  104  CO2 25 27 27   --  29  GLUCOSE 137* 112* 105*  --  106*  BUN 11 7* 5*  --  <5*  CREATININE 0.46 0.46 0.46  --  0.46  CALCIUM 8.7* 8.2* 8.5*  --  8.4*  MG  --   --   --  2.0 2.1  PHOS  --   --   --   --  3.7    GFR: Estimated Creatinine Clearance: 56.1 mL/min (by C-G formula based on SCr of 0.46 mg/dL). Liver Function Tests: Recent Labs  Lab 05/01/21 1839 05/03/21 0048  AST 20 18  ALT 13 11  ALKPHOS 62 52  BILITOT 0.4 0.6  PROT 6.2* 5.2*  ALBUMIN 3.4* 2.9*    No results for input(s): LIPASE, AMYLASE in the last 168 hours. No results for input(s): AMMONIA in the last 168 hours. Coagulation Profile: Recent Labs  Lab 05/03/21 0048  INR 1.2    Cardiac Enzymes: No results for input(s): CKTOTAL, CKMB, CKMBINDEX, TROPONINI in the last 168 hours. BNP (last 3 results) No results for input(s): PROBNP in the last 8760 hours. HbA1C: No results for input(s): HGBA1C in the last 72 hours. CBG: Recent Labs  Lab 05/04/21 1117  GLUCAP 121*   Lipid Profile: No results for input(s): CHOL, HDL, LDLCALC, TRIG, CHOLHDL, LDLDIRECT in the last 72 hours. Thyroid Function Tests: No results for input(s): TSH, T4TOTAL, FREET4, T3FREE, THYROIDAB in the last 72 hours. Anemia Panel: Recent Labs    05/03/21 0048  VITAMINB12 524  FOLATE 33.2  FERRITIN 17  TIBC 406  IRON 26*    Sepsis Labs: No results for input(s): PROCALCITON, LATICACIDVEN in the  last 168 hours.  Recent Results (from the past 240 hour(s))  Resp Panel by RT-PCR (Flu A&B, Covid) Nasopharyngeal Swab     Status: None   Collection Time: 05/02/21 12:28 AM   Specimen: Nasopharyngeal Swab; Nasopharyngeal(NP) swabs in vial transport medium  Result Value Ref Range Status   SARS Coronavirus 2 by RT PCR NEGATIVE NEGATIVE Final    Comment: (NOTE) SARS-CoV-2 target  nucleic acids are NOT DETECTED.  The SARS-CoV-2 RNA is generally detectable in upper respiratory specimens during the acute phase of infection. The lowest concentration of SARS-CoV-2 viral copies this assay can detect is 138 copies/mL. A negative result does not preclude SARS-Cov-2 infection and should not be used as the sole basis for treatment or other patient management decisions. A negative result may occur with  improper specimen collection/handling, submission of specimen other than nasopharyngeal swab, presence of viral mutation(s) within the areas targeted by this assay, and inadequate number of viral copies(<138 copies/mL). A negative result must be combined with clinical observations, patient history, and epidemiological information. The expected result is Negative.  Fact Sheet for Patients:  EntrepreneurPulse.com.au  Fact Sheet for Healthcare Providers:  IncredibleEmployment.be  This test is no t yet approved or cleared by the [place] FDA and  has been authorized for detection and/or diagnosis of SARS-CoV-2 by FDA under an Emergency Use Authorization (EUA). This EUA will remain  in effect (meaning this test can be used) for the duration of the COVID-19 declaration under Section 564(b)(1) of the Act, 21 U.S.C.section 360bbb-3(b)(1), unless the authorization is terminated  or revoked sooner.       Influenza A by PCR NEGATIVE NEGATIVE Final   Influenza B by PCR NEGATIVE NEGATIVE Final    Comment: (NOTE) The Xpert Xpress SARS-CoV-2/FLU/RSV plus assay is  intended as an aid in the diagnosis of influenza from Nasopharyngeal swab specimens and should not be used as a sole basis for treatment. Nasal washings and aspirates are unacceptable for Xpert Xpress SARS-CoV-2/FLU/RSV testing.  Fact Sheet for Patients: EntrepreneurPulse.com.au  Fact Sheet for Healthcare Providers: IncredibleEmployment.be  This test is not yet approved or cleared by the [place] FDA and has been authorized for detection and/or diagnosis of SARS-CoV-2 by FDA under an Emergency Use Authorization (EUA). This EUA will remain in effect (meaning this test can be used) for the duration of the COVID-19 declaration under Section 564(b)(1) of the Act, 21 U.S.C. section 360bbb-3(b)(1), unless the authorization is terminated or revoked.  Performed at [facility], [place] [address], [place], [place] [address]           Radiology Studies: DG Chest 2 View  Result Date: 05/03/2021 CLINICAL DATA:  Hypoxia.  Shortness of breath. EXAM: CHEST - 2 VIEW COMPARISON:  09/14/2017 FINDINGS: Patchy densities at the lung bases, right side greater than left. Upper lungs are clear. Heart size is within normal limits. No large pleural effusions. Negative for a pneumothorax. Chronic degenerative changes in both shoulders. IMPRESSION: Basilar chest densities bilaterally, right side greater than left. Findings raise concern for pneumonia particularly at the right lung base. Electronically Signed   By: Markus Daft M.D.   On: 05/03/2021 12:02        Scheduled Meds:  dorzolamide-timolol  1 drop Both Eyes BID   latanoprost  1 drop Both Eyes QHS   ondansetron  4 mg Oral Q12H   pantoprazole  40 mg Oral BID   rOPINIRole  0.5 mg Oral QHS   sertraline  150 mg Oral Daily   simvastatin  20 mg Oral q1800   Continuous Infusions:  sodium chloride     ampicillin-sulbactam (UNASYN) IV 3 g (05/04/21 0505)     LOS: 1 day    Time spent: 35 minutes.      Elmarie Shiley, MD [facility]   If 7PM-7AM, please contact night-coverage [email or web address]  05/04/2021, 2:19 PM

## 2021-05-04 NOTE — Care Management (Addendum)
Called [employer] regarding portable oxygen tank , answering service answered, they took message and will have someone return call.   Crooks with [employer] will have portable tank delivered ASAP   [place] and cancelled delivery of portable oxygen tank. When patient is ready for discharge [facility] will need to be called

## 2021-05-04 NOTE — Consult Note (Signed)
NAME:  [name], MRN:  [id number], DOB:  1941-04-08, LOS: 1 ADMISSION DATE:  05/01/2021, CONSULTATION DATE:  05/04/21 REFERRING MD:  Tyrell Antonio, CHIEF COMPLAINT:  BRBPR   History of Present Illness:  80 year old woman with O2-dependent baseline respiratory failure attributed to COPD (no PFTs in system), prior diverticular bleed requiring IR embolization who presented with ABLA self-limited and treated with transfusion.  Throughout day has become more somnolent and hypoxemic.  CXR with bibasilar infiltrates for which patient has been started on abx.  ABG with CO2 retention started on BIPAP.  Remains somnolent on this with worsening O2 on ABG prompting PCCM evaluation.  Patient currently denies pain but is slow to respond.  Pertinent  Medical History  GERD Depression Anxiety Headache LGIB  Significant Hospital Events: Including procedures, antibiotic start and stop dates in addition to other pertinent events   11/3 admitted, GI eval 11/3-11/4 worsening somnolence  Interim History / Subjective:  Consulted  Objective   Blood pressure 126/65, pulse 80, temperature 98.5 F (36.9 C), temperature source Axillary, resp. rate (!) 24, weight 81.6 kg, SpO2 97 %.    FiO2 (%):  [21 %-30 %] 30 %   Intake/Output Summary (Last 24 hours) at 05/04/2021 1826 Last data filed at 05/04/2021 1200 Gross per 24 hour  Intake 880 ml  Output 800 ml  Net 80 ml   Filed Weights   05/03/21 1300  Weight: 81.6 kg    Examination: General: elderly woman on BIPAP HENT: BIPAP in place with good seal Lungs:  reduced breath sounds bilaterally Cardiovascular: RRR, ext warm Abdomen: Soft, +BS Extremities: No edema Neuro: Moves all 4 ext to command but slow to do so Skin: pale  Resolved Hospital Problem list   N/a  Assessment & Plan:  Acute on chronic hypoxemic and hypercarbic respiratory failure- carries diagnosis of COPD; poor air movement on exam, bibasilar infiltrates.  Agree with tx as aspiration and  AECOPD.  Given clinical appearance and lack of improvement in mental status on BIPAP would bring to ICU for now as fair risk of decompensating to need intubation. - NPO - f/u CBC - Continue BIPAP at current settings - Start steroids, yupelri, brovana - Continue unasyn - Check LE duplex - Will update family follow with you; should she have good turnaround can go back to [facility] in AM otherwise can can take over  LGIB- c/w diverticular, no signs of recurrence but CBC pending  Best Practice (right click and "Reselect all SmartList Selections" daily)   Diet/type: NPO DVT prophylaxis: SCD GI prophylaxis: PPI Lines: N/A Foley:  N/A Code Status:  full code Last date of multidisciplinary goals of care discussion [will reach out]  Labs   CBC: Recent Labs  Lab 05/02/21 0930 05/02/21 1622 05/03/21 0048 05/03/21 1233 05/04/21 0203 05/04/21 0947  WBC 6.2  --  7.4 7.8 7.1 6.5  NEUTROABS  --   --  5.5  --   --   --   HGB 8.2* 8.5* 8.0* 8.5* 7.6* 8.1*  HCT 25.2* 26.7* 25.8* 26.7* 25.2* 26.7*  MCV 78.8*  --  80.1 80.2 83.2 83.4  PLT 160  --  166 181 157 761    Basic Metabolic Panel: Recent Labs  Lab 05/01/21 1839 05/02/21 0416 05/02/21 0930 05/02/21 1603 05/03/21 0048  NA 134* 134* 138  --  137  K 4.1 3.7 3.6  --  3.6  CL 102 103 104  --  104  CO2 25 27 27   --  29  GLUCOSE 137* 112* 105*  --  106*  BUN 11 7* 5*  --  <5*  CREATININE 0.46 0.46 0.46  --  0.46  CALCIUM 8.7* 8.2* 8.5*  --  8.4*  MG  --   --   --  2.0 2.1  PHOS  --   --   --   --  3.7   GFR: Estimated Creatinine Clearance: 56.1 mL/min (by C-G formula based on SCr of 0.46 mg/dL). Recent Labs  Lab 05/03/21 0048 05/03/21 1233 05/04/21 0203 05/04/21 0947  WBC 7.4 7.8 7.1 6.5    Liver Function Tests: Recent Labs  Lab 05/01/21 1839 05/03/21 0048  AST 20 18  ALT 13 11  ALKPHOS 62 52  BILITOT 0.4 0.6  PROT 6.2* 5.2*  ALBUMIN 3.4* 2.9*   No results for input(s): LIPASE, AMYLASE in the last 168 hours. No  results for input(s): AMMONIA in the last 168 hours.  ABG    Component Value Date/Time   PHART 7.360 05/04/2021 1748   PCO2ART 58.0 (H) 05/04/2021 1748   PO2ART 67.5 (L) 05/04/2021 1748   HCO3 32.0 (H) 05/04/2021 1748   TCO2 33 (H) 09/09/2017 1109   O2SAT 93.9 05/04/2021 1748     Coagulation Profile: Recent Labs  Lab 05/03/21 0048  INR 1.2    Cardiac Enzymes: No results for input(s): CKTOTAL, CKMB, CKMBINDEX, TROPONINI in the last 168 hours.  HbA1C: No results found for: HGBA1C  CBG: Recent Labs  Lab 05/04/21 1117 05/04/21 1735  GLUCAP 121* 92    Review of Systems:   Limited due to encephalopathy and BIPAP, denies pain and SOB  Past Medical History:  She,  has a past medical history of Anxiety, Arthritis, BPPV (benign paroxysmal positional vertigo), Depression, GERD (gastroesophageal reflux disease), Glaucoma, both eyes, Headache, History of blood transfusion (~ 2008), Hyperlipemia, Lower GI bleeding (~ 2008; 09/05/2017), Macular degeneration, bilateral, Osteopenia, Seasonal allergies, Skin cancer, basal cell (2001), Sleeping excessive, and Tinnitus of both ears.   Surgical History:   Past Surgical History:  Procedure Laterality Date   BASAL CELL CARCINOMA EXCISION  2001   "off my nose, left side"   BLEPHAROPLASTY Bilateral    CATARACT EXTRACTION W/ INTRAOCULAR LENS  IMPLANT, BILATERAL Bilateral 1990's   EYE SURGERY Bilateral    "to improve vision after cataract OR"   IR ANGIOGRAM FOLLOW UP STUDY  12/19/2020   IR ANGIOGRAM SELECTIVE EACH ADDITIONAL VESSEL  12/19/2020   IR ANGIOGRAM SELECTIVE EACH ADDITIONAL VESSEL  12/19/2020   IR ANGIOGRAM VISCERAL SELECTIVE  12/19/2020   IR EMBO ART  VEN HEMORR LYMPH EXTRAV  INC GUIDE ROADMAPPING  12/19/2020   IR US GUIDE VASC ACCESS RIGHT  12/19/2020   JOINT REPLACEMENT     STAPEDES SURGERY Left    "scraped stapedes because it was sticking when it wasn't suppose to"   [place] Left ~ 2008   TOTAL KNEE ARTHROPLASTY  12/20/2011   Procedure: TOTAL KNEE ARTHROPLASTY;  Surgeon: Augustin Schooling, MD;  Location: [facility];  Service: Orthopedics;  Laterality: Right;  Right Total Knee Arthroplasty   TUBAL LIGATION  1980's     Social History:   reports that she quit smoking about 28 years ago. She has a 27.75 pack-year smoking history. She has never used smokeless tobacco. She reports current alcohol use of about 7.0 standard drinks per week. She reports that she does not use drugs.   Family History:  Her family history includes Bladder Cancer in her father; Heart attack in her mother; Heart disease in her father; Heart failure in her father; Hypertension in her sister; Supraventricular tachycardia in her sister.   Allergies Allergies  Allergen Reactions   Morphine And Related Itching   Abaloparatide Other (See Comments)   Aspirin Other (See Comments)   Duloxetine Hcl Other (See Comments)   Ventolin [Albuterol]     Other reaction(s): rapid heart beat   Cefadroxil Hives    Patient can take amoxicillin and cipro     Home Medications  Prior to Admission medications   Medication Sig Start Date End Date Taking? Authorizing Provider  acetaminophen (TYLENOL) 500 MG tablet Take 1,000 mg by mouth daily as needed for headache.   Yes [provider]  albuterol (PROVENTIL HFA;VENTOLIN HFA) 108 (90 Base) MCG/ACT inhaler Inhale 2 puffs into the lungs daily as needed. For seasonal allergies . Use 2 puffs 3 times daily x 4 days then back to home regimen. Patient taking differently: Inhale 2 puffs into the lungs daily as needed for wheezing or shortness of breath. 08/10/17  Yes Eugenie Filler, MD  Cholecalciferol (VITAMIN D3 PO) Take 1 tablet by mouth daily.   Yes [provider]  dorzolamide-timolol (COSOPT) 22.3-6.8 MG/ML ophthalmic solution Place 1 drop into both eyes 2 (two) times daily.   Yes [provider]  HYDROcodone-acetaminophen  (NORCO/VICODIN) 5-325 MG tablet Take 1 tablet by mouth every 6 (six) hours as needed for moderate pain. 12/25/20  Yes [provider]  latanoprost (XALATAN) 0.005 % ophthalmic solution Place 1 drop into both eyes at bedtime. 02/13/18  Yes [provider]  loratadine (CLARITIN) 10 MG tablet Take 1 tablet (10 mg total) by mouth daily. 08/11/17  Yes Eugenie Filler, MD  Multiple Vitamin (MULTIVITAMIN WITH MINERALS) TABS Take 1 tablet by mouth daily.   Yes [provider]  omeprazole (PRILOSEC) 20 MG capsule Take 20 mg by mouth every evening.   Yes [provider]  OXYGEN 2lpm with sleep only  [facility]   Yes [provider]  propranolol (INDERAL) 20 MG tablet Take 20 mg by mouth See admin instructions. 20 mg qd  20 mg  as needed for heartracing 04/30/21  Yes [provider]  rOPINIRole (REQUIP) 0.5 MG tablet Take 1 tablet (0.5 mg total) by mouth at bedtime. Patient taking differently: Take 0.5 mg by mouth at bedtime as needed (restless legs). 12/23/20  Yes Nita Sells, MD  sertraline (ZOLOFT) 100 MG tablet Take 1 tablet (100 mg total) by mouth daily. Patient taking differently: Take 150 mg by mouth daily. 12/23/20  Yes Nita Sells, MD  simvastatin (ZOCOR) 20 MG tablet Take 20 mg by mouth daily at 6 PM.   Yes [provider]  vitamin C (ASCORBIC ACID) 500 MG tablet Take 500 mg by mouth daily.   Yes [provider]  ALPRAZolam (XANAX) 0.25 MG tablet Take 1 tablet (0.25 mg total) by mouth daily as needed for anxiety. Patient not taking: No sig reported 09/14/17   Aline August, MD  DULoxetine (CYMBALTA) 20 MG capsule Take 1 capsule (20 mg total) by mouth daily. Patient not taking: No sig reported 12/23/20   Nita Sells, MD  nitrofurantoin, macrocrystal-monohydrate, (MACROBID) 100 MG capsule Take 100 mg by mouth See admin instructions. hs x 7 days Patient not taking: No sig reported 01/15/21   [provider]  sulfamethoxazole-trimethoprim (BACTRIM DS) 800-160 MG tablet Take 1 tablet by mouth See  admin instructions. Bid x 7 days Patient not taking: Reported on 05/02/2021 01/24/21   [provider]     Critical care time: 34 minutes not including any separately billable procedures   Interventions: BIPAP titration, intubation watch

## 2021-05-04 NOTE — Progress Notes (Signed)
eLink Physician-Brief Progress Note Patient Name: [name] DOB: Feb 10, 1941 MRN: [id number]   Date of Service  05/04/2021  HPI/Events of Note  Frequent PVC's - K+ = 3.6 and Creatinine = 0.53  eICU Interventions  Plan: Replace K+ Mg++ level STAT. Repeat BMP and Mg++ in AM. Phosphorus level in AM.     Intervention Category Major Interventions: Arrhythmia - evaluation and management  Ernesto Lashway Eugene 05/04/2021, 11:29 PM

## 2021-05-04 NOTE — Progress Notes (Signed)
I went by the pts bedside to exam her, she is on BIPAP at present saturating well, but is somnolent. She awakens to moving her arm and is able to answer questions appropriately, but falls back asleep quickly. Given her continued somnolence and interval similar/slightly worse CXR, we discussed intubation. Pt expressed that she would not want to undergo intubation or be placed on a vent, even if it results in her death. However, pt was falling asleep intermittently during this discussion and I don't think she is the best frame of mind to be able to understand the full implications of this, so wanted to confirm with pts husband. Called pts husband x2 and left a message, no answer.   Was called by nursing staff to call and update Dr. Woody Seller, pts son. We discussed his mom's current care as well as her articulation of do not intubate status. I told him that prior to confirming this change I would like to confirm this with her husband so that we are all on the same page.   Will repeat an ABG now to make sure CO2 is not worsening and give a little diuresis as well. Will try to reach husband again in a little while.

## 2021-05-04 NOTE — Progress Notes (Signed)
Patient transported on the BiPAP with no complications. Vitals stable. BiPAP settings adjusted per patient needs to obtain VT of 350. Report given to receiving RT.

## 2021-05-05 ENCOUNTER — Inpatient Hospital Stay (HOSPITAL_COMMUNITY): Payer: Medicare HMO

## 2021-05-05 DIAGNOSIS — R0602 Shortness of breath: Secondary | ICD-10-CM

## 2021-05-05 DIAGNOSIS — K922 Gastrointestinal hemorrhage, unspecified: Secondary | ICD-10-CM | POA: Diagnosis not present

## 2021-05-05 DIAGNOSIS — R0609 Other forms of dyspnea: Secondary | ICD-10-CM | POA: Diagnosis not present

## 2021-05-05 DIAGNOSIS — M7989 Other specified soft tissue disorders: Secondary | ICD-10-CM

## 2021-05-05 DIAGNOSIS — R609 Edema, unspecified: Secondary | ICD-10-CM

## 2021-05-05 LAB — CBC
HCT: 28.9 % — ABNORMAL LOW (ref 36.0–46.0)
Hemoglobin: 8.7 g/dL — ABNORMAL LOW (ref 12.0–15.0)
MCH: 25 pg — ABNORMAL LOW (ref 26.0–34.0)
MCHC: 30.1 g/dL (ref 30.0–36.0)
MCV: 83 fL (ref 80.0–100.0)
Platelets: 161 10*3/uL (ref 150–400)
RBC: 3.48 MIL/uL — ABNORMAL LOW (ref 3.87–5.11)
RDW: 19.6 % — ABNORMAL HIGH (ref 11.5–15.5)
WBC: 7.1 10*3/uL (ref 4.0–10.5)
nRBC: 0.3 % — ABNORMAL HIGH (ref 0.0–0.2)

## 2021-05-05 LAB — BASIC METABOLIC PANEL
Anion gap: 8 (ref 5–15)
Anion gap: 9 (ref 5–15)
BUN: 7 mg/dL — ABNORMAL LOW (ref 8–23)
BUN: 8 mg/dL (ref 8–23)
CO2: 30 mmol/L (ref 22–32)
CO2: 30 mmol/L (ref 22–32)
Calcium: 8.6 mg/dL — ABNORMAL LOW (ref 8.9–10.3)
Calcium: 8.7 mg/dL — ABNORMAL LOW (ref 8.9–10.3)
Chloride: 100 mmol/L (ref 98–111)
Chloride: 99 mmol/L (ref 98–111)
Creatinine, Ser: 0.56 mg/dL (ref 0.44–1.00)
Creatinine, Ser: 0.57 mg/dL (ref 0.44–1.00)
GFR, Estimated: >60 mL/min (ref >60)
GFR, Estimated: >60 mL/min (ref >60)
Glucose, Bld: 106 mg/dL — ABNORMAL HIGH (ref 70–99)
Glucose, Bld: 95 mg/dL (ref 70–99)
Potassium: 3.9 mmol/L (ref 3.5–5.1)
Potassium: 4.4 mmol/L (ref 3.5–5.1)
Sodium: 138 mmol/L (ref 135–145)
Sodium: 138 mmol/L (ref 135–145)

## 2021-05-05 LAB — ECHOCARDIOGRAM COMPLETE
AR max vel: 2.15 cm2
AV Area VTI: 2.2 cm2
AV Area mean vel: 2.14 cm2
AV Mean grad: 6 mmHg
AV Peak grad: 11.4 mmHg
Ao pk vel: 1.69 m/s
Area-P 1/2: 4.49 cm2
S' Lateral: 2.8 cm
Weight: 2878.33 oz

## 2021-05-05 LAB — MAGNESIUM: Magnesium: 2 mg/dL (ref 1.7–2.4)

## 2021-05-05 LAB — GLUCOSE, CAPILLARY
Glucose-Capillary: 115 mg/dL — ABNORMAL HIGH (ref 70–99)
Glucose-Capillary: 108 mg/dL — ABNORMAL HIGH (ref 70–99)

## 2021-05-05 LAB — PHOSPHORUS: Phosphorus: 3.2 mg/dL (ref 2.5–4.6)

## 2021-05-05 MED ORDER — FUROSEMIDE 10 MG/ML IJ SOLN
40.0000 mg | Freq: Four times a day (QID) | INTRAMUSCULAR | Status: AC
  Administered 2021-05-05 – 2021-05-06 (×2): 40 mg via INTRAVENOUS
  Filled 2021-05-05 (×2): qty 4

## 2021-05-05 MED ORDER — MODAFINIL 100 MG PO TABS
100.0000 mg | ORAL_TABLET | Freq: Every day | ORAL | Status: DC
  Administered 2021-05-05 – 2021-05-10 (×6): 100 mg via ORAL
  Filled 2021-05-05 (×6): qty 1

## 2021-05-05 MED ORDER — IOHEXOL 350 MG/ML SOLN
75.0000 mL | Freq: Once | INTRAVENOUS | Status: AC | PRN
  Administered 2021-05-05: 75 mL via INTRAVENOUS

## 2021-05-05 NOTE — Progress Notes (Signed)
PROGRESS NOTE    [name]  ZOX:[id number] DOB: 09/06/1940 DOA: 05/01/2021 PCP: [name], MD   Brief Narrative: 80 year old with past medical history significant for COPD on home oxygen at night, hypertension admitted on June of this year for diverticular bleed underwent embolization of severe bleeding presents to the ER complaining of bloody stool in low hemoglobin. Patient hemoglobin was found to be 5.8, she received 2 unit of packed red blood cells in the ED.  GI has been consulted. Her hemoglobin has remained stable.  Her bleeding appears to have stopped.  Patient  on 11/03 was noted to be hypoxic on room air.  She is usually on 2 L of oxygen only at night. She was found to be tachycardic with heart rate in the 130s, oxygen saturation 70 on room air.  She was placed on 2  L of oxygen with improvement of her oxygen saturation.  Chest x-ray showed basilar chest density bilaterally right side greater than left.  Findings raise concern for pneumonia. Started on IV antibiotics.   Patient became more lethargic on 11/04.. ABG Ph 7.3 PCO2 68. Patient will be started on BIPAP/ transfer to progressive bed. Subsequently became more hypoxic. CCM consulted. Transfer to ICU. Received IV lasix, started on Nebulizer.    Assessment & Plan:   Principal Problem:   Acute GI bleeding Active Problems:   COPD GOLD II    Acute blood loss anemia   1-Acute blood loss anemia, acute GI bleed: Presume diverticular bleed.  Patient presented with a hemoglobin of 5.6.  She has received 2 units of packed red blood cell. Hb  has remained stable at 8. Continue with PPI BID.  GI has signed off. If she has any recurrent GI bleed she may need a stat CTA. Hb has remain stable.   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  Patient is 2 L of oxygen at bedtime. Oxygen down to 74 RA in am, placed on 2 L oxygen.  Secondary to possibly PNA and acute exacerbation of COPD>  X-ray: Show bilateral  density worse on the right concern for pneumonia. Continue with IV Unasyn/  Lethargic 11/04: . ABG Ph 7.3 PCO 64. Placed on BIPAP.  Repeated Blood gas ABG PCO2 improved, oxygen decreased. Transfer to ICU, CCM consulted.  Received one dose of lasix 11/04 On Brovana, IV steroids, [place].  Off BIPAP, on 20 L oxygen HF.   3-Acute metabolic encephalopathy;  Related to hypercapnia, hypoxemia,  Improved today. She is alert conversant   COPD on nocturnal oxygen: See number 2.  Hypertension/ tachycardia; change  propanolol/ tp PRN. She uses at home PRN.  RLS: Continue with ropinirole Depression: Continue with Zoloft    Pressure Injury 12/20/20 Elbow Anterior;Right (Active)  12/20/20 1300  Location: Elbow  Location Orientation: Anterior;Right  Staging:   Wound Description (Comments):   Present on Admission:       Estimated body mass index is 30.88 kg/m as calculated from the following:   Height as of 02/01/21: 5\' 4"  (1.626 m).   Weight as of this encounter: 81.6 kg.   DVT prophylaxis: SCD Code Status: Full code Family Communication:care discussed with patient husband 11/04. CCM will update family today.  Disposition Plan:  Status is: Inpatient.   The patient will require care spanning > 2 midnights and should be moved to inpatient because: Acute on chronic hypoxic hypercapnic respiratory failure, secondary to pneumonia, COPD exacerbation, patient require BiPAP and high flow oxygen.  Consultants:  GI  Procedures:  None  Antimicrobials:  Unasyn 05/03/2021  Subjective: She is alert this morning, she does not remember what happened yesterday she only know that she was very sleepy and she could not talk. She denies worsening cough, she report few episode of coughing.  Denies worsening shortness of breath.  Objective: Vitals:   05/05/21 0800 05/05/21 0900 05/05/21 0923 05/05/21 1000  BP: (!) 114/56 (!) 107/52  (!) 118/59  Pulse: 77 73  91  Resp: 19 20  (!) 22   Temp: 97.9 F (36.6 C)     TempSrc: Axillary     SpO2: 97% 99% 100% 95%  Weight:        Intake/Output Summary (Last 24 hours) at 05/05/2021 1041 Last data filed at 05/05/2021 1000 Gross per 24 hour  Intake 1163.67 ml  Output 1700 ml  Net -536.33 ml    Filed Weights   05/03/21 1300  Weight: 81.6 kg    Examination:  General exam: Alert, following commands conversant Respiratory system: Creased breath sounds, no wheezing Cardiovascular system: S1, S2 regular rhythm or rate Gastrointestinal system: BS  present, soft nontender non distended Central nervous system: Alert follows commands Extremities: No significant lower extremity edema   Data Reviewed: I have personally reviewed following labs and imaging studies  CBC: Recent Labs  Lab 05/03/21 0048 05/03/21 1233 05/04/21 0203 05/04/21 0947 05/04/21 1817 05/04/21 2049 05/05/21 0009  WBC 7.4 7.8 7.1 6.5 6.5  --  7.1  NEUTROABS 5.5  --   --   --   --   --   --   HGB 8.0* 8.5* 7.6* 8.1* 8.1* 8.2* 8.7*  HCT 25.8* 26.7* 25.2* 26.7* 26.8* 24.0* 28.9*  MCV 80.1 80.2 83.2 83.4 83.8  --  83.0  PLT 166 181 157 163 160  --  833    Basic Metabolic Panel: Recent Labs  Lab 05/02/21 0930 05/02/21 1603 05/03/21 0048 05/04/21 1817 05/04/21 2049 05/05/21 0009 05/05/21 0802  NA 138  --  137 136 137 138 138  K 3.6  --  3.6 3.8 3.6 3.9 4.4  CL 104  --  104 98  --  99 100  CO2 27  --  29 32  --  30 30  GLUCOSE 105*  --  106* 96  --  106* 95  BUN 5*  --  <5* 6*  --  7* 8  CREATININE 0.46  --  0.46 0.53  --  0.57 0.56  CALCIUM 8.5*  --  8.4* 8.3*  --  8.6* 8.7*  MG  --  2.0 2.1  --   --  2.0  --   PHOS  --   --  3.7  --   --  3.2  --     GFR: Estimated Creatinine Clearance: 56.1 mL/min (by C-G formula based on SCr of 0.56 mg/dL). Liver Function Tests: Recent Labs  Lab 05/01/21 1839 05/03/21 0048  AST 20 18  ALT 13 11  ALKPHOS 62 52  BILITOT 0.4 0.6  PROT 6.2* 5.2*  ALBUMIN 3.4* 2.9*    No results for  input(s): LIPASE, AMYLASE in the last 168 hours. No results for input(s): AMMONIA in the last 168 hours. Coagulation Profile: Recent Labs  Lab 05/03/21 0048  INR 1.2    Cardiac Enzymes: No results for input(s): CKTOTAL, CKMB, CKMBINDEX, TROPONINI in the last 168 hours. BNP (last 3 results) No results for input(s): PROBNP in the last 8760 hours. HbA1C: No results  for input(s): HGBA1C in the last 72 hours. CBG: Recent Labs  Lab 05/04/21 1735 05/04/21 1941 05/04/21 2309 05/05/21 0313 05/05/21 0756  GLUCAP 92 83 105* 115* 108*    Lipid Profile: No results for input(s): CHOL, HDL, LDLCALC, TRIG, CHOLHDL, LDLDIRECT in the last 72 hours. Thyroid Function Tests: No results for input(s): TSH, T4TOTAL, FREET4, T3FREE, THYROIDAB in the last 72 hours. Anemia Panel: Recent Labs    05/03/21 0048  VITAMINB12 524  FOLATE 33.2  FERRITIN 17  TIBC 406  IRON 26*    Sepsis Labs: No results for input(s): PROCALCITON, LATICACIDVEN in the last 168 hours.  Recent Results (from the past 240 hour(s))  Resp Panel by RT-PCR (Flu A&B, Covid) Nasopharyngeal Swab     Status: None   Collection Time: 05/02/21 12:28 AM   Specimen: Nasopharyngeal Swab; Nasopharyngeal(NP) swabs in vial transport medium  Result Value Ref Range Status   SARS Coronavirus 2 by RT PCR NEGATIVE NEGATIVE Final    Comment: (NOTE) SARS-CoV-2 target nucleic acids are NOT DETECTED.  The SARS-CoV-2 RNA is generally detectable in upper respiratory specimens during the acute phase of infection. The lowest concentration of SARS-CoV-2 viral copies this assay can detect is 138 copies/mL. A negative result does not preclude SARS-Cov-2 infection and should not be used as the sole basis for treatment or other patient management decisions. A negative result may occur with  improper specimen collection/handling, submission of specimen other than nasopharyngeal swab, presence of viral mutation(s) within the areas targeted by this  assay, and inadequate number of viral copies(<138 copies/mL). A negative result must be combined with clinical observations, patient history, and epidemiological information. The expected result is Negative.  Fact Sheet for Patients:  EntrepreneurPulse.com.au  Fact Sheet for Healthcare Providers:  IncredibleEmployment.be  This test is no t yet approved or cleared by the [place] FDA and  has been authorized for detection and/or diagnosis of SARS-CoV-2 by FDA under an Emergency Use Authorization (EUA). This EUA will remain  in effect (meaning this test can be used) for the duration of the COVID-19 declaration under Section 564(b)(1) of the Act, 21 U.S.C.section 360bbb-3(b)(1), unless the authorization is terminated  or revoked sooner.       Influenza A by PCR NEGATIVE NEGATIVE Final   Influenza B by PCR NEGATIVE NEGATIVE Final    Comment: (NOTE) The Xpert Xpress SARS-CoV-2/FLU/RSV plus assay is intended as an aid in the diagnosis of influenza from Nasopharyngeal swab specimens and should not be used as a sole basis for treatment. Nasal washings and aspirates are unacceptable for Xpert Xpress SARS-CoV-2/FLU/RSV testing.  Fact Sheet for Patients: EntrepreneurPulse.com.au  Fact Sheet for Healthcare Providers: IncredibleEmployment.be  This test is not yet approved or cleared by the [place] FDA and has been authorized for detection and/or diagnosis of SARS-CoV-2 by FDA under an Emergency Use Authorization (EUA). This EUA will remain in effect (meaning this test can be used) for the duration of the COVID-19 declaration under Section 564(b)(1) of the Act, 21 U.S.C. section 360bbb-3(b)(1), unless the authorization is terminated or revoked.  Performed at [facility], [place] [address] [place], [place] [address]   MRSA Next Gen by PCR, Nasal     Status: None   Collection Time: 05/04/21  7:28 PM    Specimen: Nasal Mucosa; Nasal Swab  Result Value Ref Range Status   MRSA by PCR Next Gen NOT DETECTED NOT DETECTED Final    Comment: (NOTE) The GeneXpert MRSA Assay (FDA approved for NASAL  specimens only), is one component of a comprehensive MRSA colonization surveillance program. It is not intended to diagnose MRSA infection nor to guide or monitor treatment for MRSA infections. Test performance is not FDA approved in patients less than 6 years old. Performed at [facility], [place] [address], [place], [place] [address]           Radiology Studies: DG Chest 2 View  Result Date: 05/03/2021 CLINICAL DATA:  Hypoxia.  Shortness of breath. EXAM: CHEST - 2 VIEW COMPARISON:  09/14/2017 FINDINGS: Patchy densities at the lung bases, right side greater than left. Upper lungs are clear. Heart size is within normal limits. No large pleural effusions. Negative for a pneumothorax. Chronic degenerative changes in both shoulders. IMPRESSION: Basilar chest densities bilaterally, right side greater than left. Findings raise concern for pneumonia particularly at the right lung base. Electronically Signed   By: Markus Daft M.D.   On: 05/03/2021 12:02   DG CHEST PORT 1 VIEW  Result Date: 05/04/2021 CLINICAL DATA:  Hypoxemia.  GI bleed. EXAM: PORTABLE CHEST 1 VIEW COMPARISON:  05/03/2021 FINDINGS: The patient is rotated to the right with unchanged cardiomediastinal silhouette. Aortic atherosclerosis is noted. Patchy bibasilar airspace opacities of mildly increased. Small pleural effusions are questioned. No pneumothorax is identified although the patient's chin obscures the right lung apex. Degenerative changes are again noted involving the right greater than left shoulders. IMPRESSION: Mildly increased bibasilar opacities which may reflect infection or atelectasis. Possible small pleural effusions. Electronically Signed   By: Logan Bores M.D.   On: 05/04/2021 18:53        Scheduled Meds:   arformoterol  15 mcg Nebulization BID   Chlorhexidine Gluconate Cloth  6 each Topical Daily   dextrose  25 mL Intravenous Once   dorzolamide-timolol  1 drop Both Eyes BID   latanoprost  1 drop Both Eyes QHS   methylPREDNISolone (SOLU-MEDROL) injection  40 mg Intravenous Q12H   modafinil  100 mg Oral Daily   ondansetron  4 mg Oral Q12H   pantoprazole  40 mg Oral BID   revefenacin  175 mcg Nebulization Daily   sertraline  150 mg Oral Daily   simvastatin  20 mg Oral q1800   Continuous Infusions:  sodium chloride 10 mL/hr at 05/05/21 1000   ampicillin-sulbactam (UNASYN) IV Stopped (05/05/21 0609)     LOS: 2 days    Time spent: 35 minutes.     Elmarie Shiley, MD [facility]   If 7PM-7AM, please contact night-coverage [email or web address]  05/05/2021, 10:41 AM

## 2021-05-05 NOTE — Progress Notes (Signed)
VASCULAR LAB    Bilateral lower extremity venous duplex has been performed.  See CV proc for preliminary results.   Everett Ehrler, RVT 05/05/2021, 3:09 PM

## 2021-05-05 NOTE — Progress Notes (Signed)
  Echocardiogram 2D Echocardiogram has been performed.  Merrie Roof F 05/05/2021, 3:04 PM

## 2021-05-05 NOTE — Progress Notes (Signed)
VASCULAR LAB    Right upper extremity venous duplex has been performed.  See CV proc for preliminary results.   Dasani Thurlow, RVT 05/05/2021, 3:10 PM

## 2021-05-05 NOTE — Progress Notes (Signed)
   NAME:  [name], MRN:  [id number], DOB:  1941-04-28, LOS: 2 ADMISSION DATE:  05/01/2021, CONSULTATION DATE:  05/04/21 REFERRING MD:  Tyrell Antonio, CHIEF COMPLAINT:  BRBPR   History of Present Illness:  80 year old woman with O2-dependent baseline respiratory failure attributed to COPD (no PFTs in system), prior diverticular bleed requiring IR embolization who presented with ABLA self-limited and treated with transfusion.  Throughout day has become more somnolent and hypoxemic.  CXR with bibasilar infiltrates for which patient has been started on abx.  ABG with CO2 retention started on BIPAP.  Remains somnolent on this with worsening O2 on ABG prompting PCCM evaluation.  Patient currently denies pain but is slow to respond.  Pertinent  Medical History  GERD Depression Anxiety Headache LGIB  Significant Hospital Events:  11/3 admitted, GI eval 11/3-11/4 worsening somnolence 11/5 No acute events overnight, more alert and interactive this a.m. on BiPAP.    Interim History / Subjective:  States breathing feels improved but not quite yet back to baseline. No acute complaints this AM   Objective   Blood pressure 114/61, pulse 73, temperature 98.5 F (36.9 C), temperature source Axillary, resp. rate 20, weight 81.6 kg, SpO2 97 %.    FiO2 (%):  [21 %-30 %] 30 %   Intake/Output Summary (Last 24 hours) at 05/05/2021 0659 Last data filed at 05/05/2021 0600 Gross per 24 hour  Intake 1333.12 ml  Output 1750 ml  Net -416.88 ml    Filed Weights   05/03/21 1300  Weight: 81.6 kg    Examination: General: Acute on chronic ill-appearing elderly female lying in bed on BiPAP in no acute distress c HEENT: [place]/AT, MM pink/moist, PERRL,  Neuro: Alert and oriented x3, nonfocal CV: s1s2 regular rate and rhythm, no murmur, rubs, or gallops,  PULM: Faint expiratory wheeze worse in the left, no increased work of breathing, tolerating BiPAP GI: soft, bowel sounds active in all 4 quadrants, non-tender,  non-distended Extremities: warm/dry, 2+ pitting right upper extremity edema  Skin: no rashes or lesions  Resolved Hospital Problem list   N/a  Assessment & Plan:  Acute on chronic hypoxemic and hypercarbic respiratory failure - Carries diagnosis of COPD; poor air movement on exam, bibasilar infiltrates.  Agree with tx as aspiration and AECOPD.   P: Trial off BiPAP this a.m. as mentation has improved If maintains on nasal cannula allow for oral diet Continue empiric antibiotics SPO2 goal greater than 88 Check lower extremity Dopplers and right upper extremity Doppler  Lower GI bleed - c/w diverticular, no signs of recurrence  P: Trend CBC Monitor for signs of bleeding Hemoglobin remained stable  Best Practice (right click and "Reselect all SmartList Selections" daily)   Diet/type: NPO DVT prophylaxis: SCD GI prophylaxis: PPI Lines: N/A Foley:  N/A Code Status:  full code Last date of multidisciplinary goals of care discussion [will reach out]  Critical care time:    Katelee Schupp D. Kenton Kingfisher, NP-C [facility] Personal contact information can be found on Amion  05/05/2021, 7:01 AM

## 2021-05-06 DIAGNOSIS — J441 Chronic obstructive pulmonary disease with (acute) exacerbation: Secondary | ICD-10-CM | POA: Diagnosis not present

## 2021-05-06 DIAGNOSIS — J449 Chronic obstructive pulmonary disease, unspecified: Secondary | ICD-10-CM | POA: Diagnosis not present

## 2021-05-06 DIAGNOSIS — I509 Heart failure, unspecified: Secondary | ICD-10-CM | POA: Diagnosis not present

## 2021-05-06 DIAGNOSIS — J9621 Acute and chronic respiratory failure with hypoxia: Secondary | ICD-10-CM

## 2021-05-06 DIAGNOSIS — J9622 Acute and chronic respiratory failure with hypercapnia: Secondary | ICD-10-CM

## 2021-05-06 DIAGNOSIS — K922 Gastrointestinal hemorrhage, unspecified: Secondary | ICD-10-CM | POA: Diagnosis not present

## 2021-05-06 LAB — BASIC METABOLIC PANEL
Anion gap: 7 (ref 5–15)
BUN: 10 mg/dL (ref 8–23)
CO2: 36 mmol/L — ABNORMAL HIGH (ref 22–32)
Calcium: 8.4 mg/dL — ABNORMAL LOW (ref 8.9–10.3)
Chloride: 92 mmol/L — ABNORMAL LOW (ref 98–111)
Creatinine, Ser: 0.55 mg/dL (ref 0.44–1.00)
GFR, Estimated: >60 mL/min (ref >60)
Glucose, Bld: 153 mg/dL — ABNORMAL HIGH (ref 70–99)
Potassium: 3.5 mmol/L (ref 3.5–5.1)
Sodium: 135 mmol/L (ref 135–145)

## 2021-05-06 LAB — GLUCOSE, CAPILLARY: Glucose-Capillary: 111 mg/dL — ABNORMAL HIGH (ref 70–99)

## 2021-05-06 LAB — CBC
HCT: 28.2 % — ABNORMAL LOW (ref 36.0–46.0)
Hemoglobin: 8.9 g/dL — ABNORMAL LOW (ref 12.0–15.0)
MCH: 25.6 pg — ABNORMAL LOW (ref 26.0–34.0)
MCHC: 31.6 g/dL (ref 30.0–36.0)
MCV: 81.3 fL (ref 80.0–100.0)
Platelets: 192 K/uL (ref 150–400)
RBC: 3.47 MIL/uL — ABNORMAL LOW (ref 3.87–5.11)
RDW: 20.1 % — ABNORMAL HIGH (ref 11.5–15.5)
WBC: 6.3 K/uL (ref 4.0–10.5)
nRBC: 0 % (ref 0.0–0.2)

## 2021-05-06 MED ORDER — METHYLPREDNISOLONE SODIUM SUCC 40 MG IJ SOLR
40.0000 mg | Freq: Every day | INTRAMUSCULAR | Status: DC
Start: 2021-05-07 — End: 2021-05-09
  Administered 2021-05-07 – 2021-05-08 (×2): 40 mg via INTRAVENOUS
  Filled 2021-05-06 (×3): qty 1

## 2021-05-06 MED ORDER — PANTOPRAZOLE SODIUM 40 MG IV SOLR
40.0000 mg | Freq: Two times a day (BID) | INTRAVENOUS | Status: DC
  Administered 2021-05-06 – 2021-05-08 (×6): 40 mg via INTRAVENOUS
  Filled 2021-05-06 (×6): qty 40

## 2021-05-06 MED ORDER — ALBUTEROL SULFATE (2.5 MG/3ML) 0.083% IN NEBU: 2.5000 mg | INHALATION_SOLUTION | RESPIRATORY_TRACT | Status: DC | PRN

## 2021-05-06 MED ORDER — POTASSIUM CHLORIDE 10 MEQ/100ML IV SOLN
10.0000 meq | INTRAVENOUS | Status: AC
  Administered 2021-05-06 (×2): 10 meq via INTRAVENOUS
  Filled 2021-05-06 (×2): qty 100

## 2021-05-06 MED ORDER — POTASSIUM CHLORIDE CRYS ER 20 MEQ PO TBCR
40.0000 meq | EXTENDED_RELEASE_TABLET | Freq: Once | ORAL | Status: DC
  Filled 2021-05-06: qty 2

## 2021-05-06 NOTE — Progress Notes (Signed)
Physical Therapy Treatment Patient Details Name: [name] MRN: [id number] DOB: 1940/08/06 Today's Date: 05/06/2021   History of Present Illness 80 y.o. female presents to [facility] hospital on 05/01/2021 with low hemoglobin and ongoing rectal bleeding. PMH includes COPD on home oxygen, hypertension.    PT Comments    Pt with regression towards her physical therapy goals, demonstrating increased weakness, imbalance and decreased activity tolerance, thus requiring increased assist for all mobility. For example, pt ambulating 150 ft on 11/3, but now is only ambulating 10 ft and requires min-modA for balance and has difficulty advancing her LLE initially. SpO2 > 90% on 2L O2. She reoprts she has not been out of bed recently. Reassess tomorrow, potentially using walker vs Rollator to determine if pt able to discharge home once medically stable. Discussed with pt/pt husband and RN.    Recommendations for follow up therapy are one component of a multi-disciplinary discharge planning process, led by the attending physician.  Recommendations may be updated based on patient status, additional functional criteria and insurance authorization.  Follow Up Recommendations  Home health PT     Assistance Recommended at Discharge Frequent or constant Supervision/Assistance  Equipment Recommendations  Wheelchair (measurements PT);3in1 (PT);Wheelchair cushion (measurements PT)    Recommendations for Other Services       Precautions / Restrictions Precautions Precautions: Other (comment);Fall Precaution Comments: poor vision Restrictions Weight Bearing Restrictions: No     Mobility  Bed Mobility Overal bed mobility: Needs Assistance Bed Mobility: Supine to Sit     Supine to sit: Min assist     General bed mobility comments: Cues for use of rail, minA to scoot hips forward with use of bed pad    Transfers Overall transfer level: Needs assistance Equipment used: Rollator (4 wheels) Transfers:  Sit to/from Stand Sit to Stand: Min assist           General transfer comment: MinA to boost up to standing position from edge of bed, cues for hand placement    Ambulation/Gait Ambulation/Gait assistance: Mod assist Gait Distance (Feet): 10 Feet Assistive device: Rollator (4 wheels) Gait Pattern/deviations: Step-through pattern;Decreased stride length;Decreased step length - left Gait velocity: decreased   General Gait Details: Pt requiring modA for balance, max multimodal cues for stepping initation and larger L step length, tendency to drag LLE. Cues for sequencing/direction   Stairs             Wheelchair Mobility    Modified Rankin (Stroke Patients Only)       Balance Overall balance assessment: Needs assistance Sitting-balance support: No upper extremity supported;Feet supported Sitting balance-Leahy Scale: Good     Standing balance support: Bilateral upper extremity supported Standing balance-Leahy Scale: Poor Standing balance comment: reliant on rollator                            Cognition Arousal/Alertness: Awake/alert Behavior During Therapy: WFL for tasks assessed/performed Overall Cognitive Status: Impaired/Different from baseline Area of Impairment: Memory;Problem solving                     Memory: Decreased short-term memory       Problem Solving: Difficulty sequencing;Requires verbal cues          Exercises General Exercises - Lower Extremity Heel Slides: Both;10 reps;Supine    General Comments        Pertinent Vitals/Pain Pain Assessment: No/denies pain    Home Living  Prior Function            PT Goals (current goals can now be found in the care plan section) Acute Rehab PT Goals Patient Stated Goal: to return to prior level of function Potential to Achieve Goals: Good Progress towards PT goals: Not progressing toward goals - comment    Frequency    Min  3X/week      PT Plan Current plan remains appropriate    Co-evaluation              AM-PAC PT "6 Clicks" Mobility   Outcome Measure  Help needed turning from your back to your side while in a flat bed without using bedrails?: A Little Help needed moving from lying on your back to sitting on the side of a flat bed without using bedrails?: A Little Help needed moving to and from a bed to a chair (including a wheelchair)?: A Little Help needed standing up from a chair using your arms (e.g., wheelchair or bedside chair)?: A Little Help needed to walk in hospital room?: A Lot Help needed climbing 3-5 steps with a railing? : Total 6 Click Score: 15    End of Session Equipment Utilized During Treatment: Gait belt;Oxygen Activity Tolerance: Patient tolerated treatment well Patient left: in chair;with call bell/phone within reach;with family/visitor present Nurse Communication: Mobility status PT Visit Diagnosis: Unsteadiness on feet (R26.81);Other abnormalities of gait and mobility (R26.89);Muscle weakness (generalized) (M62.81)     Time: 7654-6503 PT Time Calculation (min) (ACUTE ONLY): 36 min  Charges:  $Gait Training: 8-22 mins $Therapeutic Activity: 8-22 mins                     Wyona Almas, PT, DPT Acute Rehabilitation Services Pager [phone] Office [phone]    Deno Etienne 05/06/2021, 3:31 PM

## 2021-05-06 NOTE — Progress Notes (Signed)
PROGRESS NOTE    [name]  QMV:[id number] DOB: [birth year] DOA: 05/01/2021 PCP: [name], MD   Brief Narrative: 80 year old with past medical history significant for COPD on home oxygen at night, hypertension admitted on June of this year for diverticular bleed underwent embolization of severe bleeding presents to the ER complaining of bloody stool in low hemoglobin. Patient hemoglobin was found to be 5.8, she received 2 unit of packed red blood cells in the ED.  GI has been consulted. Her hemoglobin has remained stable.  Her bleeding appears to have stopped.  Patient  on 11/03 was noted to be hypoxic on room air.  She is usually on 2 L of oxygen only at night. She was found to be tachycardic with heart rate in the 130s, oxygen saturation 70 on room air.  She was placed on 2  L of oxygen with improvement of her oxygen saturation.  Chest x-ray showed basilar chest density bilaterally right side greater than left.  Findings raise concern for pneumonia. Started on IV antibiotics.   Patient became more lethargic on 11/04.. ABG Ph 7.3 PCO2 68. Patient will be started on BIPAP/ transfer to progressive bed. Subsequently became more hypoxic. CCM consulted. Transfer to ICU. Received IV lasix, started on Nebulizer. Oxygen requirement has decreased, down to 4-6 L oxygen.    Assessment & Plan:   Principal Problem:   Acute GI bleeding Active Problems:   COPD GOLD II    Acute blood loss anemia   1-Acute blood loss anemia, acute GI bleed: Presume diverticular bleed.  Patient presented with a hemoglobin of 5.6.  She has received 2 units of packed red blood cell. Hb  has remained stable at 8. Continue with PPI BID.  GI has signed off. If she has any recurrent GI bleed she may need a stat CTA. Hb has remain stable.   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  Patient is 2 L of oxygen at bedtime. Oxygen down to 74 RA in am, placed on 2 L oxygen.  Secondary to possibly PNA  and acute exacerbation of COPD>  X-ray: Show bilateral density worse on the right concern for pneumonia. Continue with IV Unasyn/  Lethargic 11/04: . ABG Ph 7.3 PCO 64. Placed on BIPAP.  Repeated Blood gas ABG PCO2 improved, oxygen decreased. Transfer to ICU, CCM consulted.  Received one dose of lasix 11/04 On Brovana, IV steroids, [place].  Oxygen requirement down to 4 L oxygen today.  CTA negative for PE>   3-Dysphagia;  Patient today report pill got stuck esophagus, then ate some crackers and got stuck as well. She report intermittent problems with dysphagia for last 3 months.  This might explain her aspiration PNA.  Will make her NPO for now. Will consult GI/ might need esophagogram or endoscopy.    Acute metabolic encephalopathy;  Related to hypercapnia, hypoxemia,  Resolved. Alert.   COPD on nocturnal oxygen: See number 2.  Hypertension/ tachycardia; change  propanolol/ tp PRN. She uses at home PRN.  RLS: Continue with ropinirole Depression: Continue with Zoloft    Pressure Injury 12/20/20 Elbow Anterior;Right (Active)  12/20/20 1300  Location: Elbow  Location Orientation: Anterior;Right  Staging:   Wound Description (Comments):   Present on Admission:       Estimated body mass index is 30.88 kg/m as calculated from the following:   Height as of 02/01/21: 5\' 4"  (1.626 m).   Weight as of this encounter: 81.6 kg.   DVT prophylaxis: SCD Code  Status: Full code Family Communication: Will update family  Disposition Plan:  Status is: Inpatient.   The patient will require care spanning > 2 midnights and should be moved to inpatient because: Acute on chronic hypoxic hypercapnic respiratory failure, secondary to pneumonia, COPD exacerbation, patient require BiPAP and high flow oxygen.       Consultants:  GI  Procedures:  None  Antimicrobials:  Unasyn 05/03/2021  Subjective: She is breathing ok.  She is alert and conversant.  She  think she might have  reflux problem. She feels full from her stomach to esophagus.  She report earlier she took a pill and got stuck in her esophagus. Then got crackles and didn't went down well. She has had this problems at home, for last 3 months.   Objective: Vitals:   05/06/21 0800 05/06/21 0853 05/06/21 0900 05/06/21 1000  BP: 97/60  110/66 111/65  Pulse: 97  78 76  Resp: (!) 21  10 16   Temp:      TempSrc:      SpO2: 100% 100% 100% 100%  Weight:        Intake/Output Summary (Last 24 hours) at 05/06/2021 1211 Last data filed at 05/06/2021 0600 Gross per 24 hour  Intake 247.25 ml  Output 4650 ml  Net -4402.75 ml    Filed Weights   05/03/21 1300  Weight: 81.6 kg    Examination:  General exam: Alert, follows command Respiratory system: BL air movement. No wheezing.  Cardiovascular system: S 1, S 2 RRR Gastrointestinal system: BS present, soft nt Central nervous system: Alert, follows command.  Extremities: no edema  Data Reviewed: I have personally reviewed following labs and imaging studies  CBC: Recent Labs  Lab 05/03/21 0048 05/03/21 1233 05/04/21 0203 05/04/21 0947 05/04/21 1817 05/04/21 2049 05/05/21 0009 05/06/21 0227  WBC 7.4   < > 7.1 6.5 6.5  --  7.1 6.3  NEUTROABS 5.5  --   --   --   --   --   --   --   HGB 8.0*   < > 7.6* 8.1* 8.1* 8.2* 8.7* 8.9*  HCT 25.8*   < > 25.2* 26.7* 26.8* 24.0* 28.9* 28.2*  MCV 80.1   < > 83.2 83.4 83.8  --  83.0 81.3  PLT 166   < > 157 163 160  --  161 192   < > = values in this interval not displayed.    Basic Metabolic Panel: Recent Labs  Lab 05/02/21 1603 05/03/21 0048 05/04/21 1817 05/04/21 2049 05/05/21 0009 05/05/21 0802 05/06/21 0227  NA  --  137 136 137 138 138 135  K  --  3.6 3.8 3.6 3.9 4.4 3.5  CL  --  104 98  --  99 100 92*  CO2  --  29 32  --  30 30 36*  GLUCOSE  --  106* 96  --  106* 95 153*  BUN  --  <5* 6*  --  7* 8 10  CREATININE  --  0.46 0.53  --  0.57 0.56 0.55  CALCIUM  --  8.4* 8.3*  --  8.6* 8.7* 8.4*   MG 2.0 2.1  --   --  2.0  --   --   PHOS  --  3.7  --   --  3.2  --   --     GFR: Estimated Creatinine Clearance: 56.1 mL/min (by C-G formula based on SCr of 0.55 mg/dL). Liver Function Tests: Recent  Labs  Lab 05/01/21 1839 05/03/21 0048  AST 20 18  ALT 13 11  ALKPHOS 62 52  BILITOT 0.4 0.6  PROT 6.2* 5.2*  ALBUMIN 3.4* 2.9*    No results for input(s): LIPASE, AMYLASE in the last 168 hours. No results for input(s): AMMONIA in the last 168 hours. Coagulation Profile: Recent Labs  Lab 05/03/21 0048  INR 1.2    Cardiac Enzymes: No results for input(s): CKTOTAL, CKMB, CKMBINDEX, TROPONINI in the last 168 hours. BNP (last 3 results) No results for input(s): PROBNP in the last 8760 hours. HbA1C: No results for input(s): HGBA1C in the last 72 hours. CBG: Recent Labs  Lab 05/04/21 1735 05/04/21 1941 05/04/21 2309 05/05/21 0313 05/05/21 0756  GLUCAP 92 83 105* 115* 108*    Lipid Profile: No results for input(s): CHOL, HDL, LDLCALC, TRIG, CHOLHDL, LDLDIRECT in the last 72 hours. Thyroid Function Tests: No results for input(s): TSH, T4TOTAL, FREET4, T3FREE, THYROIDAB in the last 72 hours. Anemia Panel: No results for input(s): VITAMINB12, FOLATE, FERRITIN, TIBC, IRON, RETICCTPCT in the last 72 hours.  Sepsis Labs: No results for input(s): PROCALCITON, LATICACIDVEN in the last 168 hours.  Recent Results (from the past 240 hour(s))  Resp Panel by RT-PCR (Flu A&B, Covid) Nasopharyngeal Swab     Status: None   Collection Time: 05/02/21 12:28 AM   Specimen: Nasopharyngeal Swab; Nasopharyngeal(NP) swabs in vial transport medium  Result Value Ref Range Status   SARS Coronavirus 2 by RT PCR NEGATIVE NEGATIVE Final    Comment: (NOTE) SARS-CoV-2 target nucleic acids are NOT DETECTED.  The SARS-CoV-2 RNA is generally detectable in upper respiratory specimens during the acute phase of infection. The lowest concentration of SARS-CoV-2 viral copies this assay can detect  is 138 copies/mL. A negative result does not preclude SARS-Cov-2 infection and should not be used as the sole basis for treatment or other patient management decisions. A negative result may occur with  improper specimen collection/handling, submission of specimen other than nasopharyngeal swab, presence of viral mutation(s) within the areas targeted by this assay, and inadequate number of viral copies(<138 copies/mL). A negative result must be combined with clinical observations, patient history, and epidemiological information. The expected result is Negative.  Fact Sheet for Patients:  EntrepreneurPulse.com.au  Fact Sheet for Healthcare Providers:  IncredibleEmployment.be  This test is no t yet approved or cleared by the [place] FDA and  has been authorized for detection and/or diagnosis of SARS-CoV-2 by FDA under an Emergency Use Authorization (EUA). This EUA will remain  in effect (meaning this test can be used) for the duration of the COVID-19 declaration under Section 564(b)(1) of the Act, 21 U.S.C.section 360bbb-3(b)(1), unless the authorization is terminated  or revoked sooner.       Influenza A by PCR NEGATIVE NEGATIVE Final   Influenza B by PCR NEGATIVE NEGATIVE Final    Comment: (NOTE) The Xpert Xpress SARS-CoV-2/FLU/RSV plus assay is intended as an aid in the diagnosis of influenza from Nasopharyngeal swab specimens and should not be used as a sole basis for treatment. Nasal washings and aspirates are unacceptable for Xpert Xpress SARS-CoV-2/FLU/RSV testing.  Fact Sheet for Patients: EntrepreneurPulse.com.au  Fact Sheet for Healthcare Providers: IncredibleEmployment.be  This test is not yet approved or cleared by the [place] FDA and has been authorized for detection and/or diagnosis of SARS-CoV-2 by FDA under an Emergency Use Authorization (EUA). This EUA will remain in effect  (meaning this test can be used) for the duration of the COVID-19  declaration under Section 564(b)(1) of the Act, 21 U.S.C. section 360bbb-3(b)(1), unless the authorization is terminated or revoked.  Performed at [facility], [place] [address], [place], [place] [address]   MRSA Next Gen by PCR, Nasal     Status: None   Collection Time: 05/04/21  7:28 PM   Specimen: Nasal Mucosa; Nasal Swab  Result Value Ref Range Status   MRSA by PCR Next Gen NOT DETECTED NOT DETECTED Final    Comment: (NOTE) The GeneXpert MRSA Assay (FDA approved for NASAL specimens only), is one component of a comprehensive MRSA colonization surveillance program. It is not intended to diagnose MRSA infection nor to guide or monitor treatment for MRSA infections. Test performance is not FDA approved in patients less than 52 years old. Performed at [facility], [place] [address], [place], [place] [address]           Radiology Studies: CT Angio Chest Pulmonary Embolism (PE) W or WO Contrast  Result Date: 05/05/2021 CLINICAL DATA:  Hypoxia on room air May 03, 2021. Tachycardia. Low O2 sats. Recent chest x-ray demonstrated bibasilar opacities suggesting possible pneumonia. EXAM: CT ANGIOGRAPHY CHEST WITH CONTRAST TECHNIQUE: Multidetector CT imaging of the chest was performed using the standard protocol during bolus administration of intravenous contrast. Multiplanar CT image reconstructions and MIPs were obtained to evaluate the vascular anatomy. CONTRAST:  5mL OMNIPAQUE IOHEXOL 350 MG/ML SOLN COMPARISON:  Chest x-ray May 04, 2021 FINDINGS: Cardiovascular: Calcified atherosclerosis in the left anterior descending coronary artery and circumflex artery. Mild cardiomegaly. Calcified atherosclerosis in the nonaneurysmal thoracic aorta. No dissection. No pulmonary emboli identified. Mediastinum/Nodes: Small pleural effusions. No pericardial effusion. The visualized esophagus is normal. Visualized thyroid is  unremarkable. Mildly enlarged right mediastinal node on series 5, image 45. No other adenopathy. No chest wall abnormalities. Lungs/Pleura: Central airways are normal. No pneumothorax. Platelike opacity in the right middle lobe, likely atelectasis. Patchy nodularity in the adjacent right middle lobe, likely infectious or inflammatory. The largest nodular component measures 4 mm. Opacities associated with the bilateral pleural effusions are likely atelectasis. No other infiltrates. There is a nodule in the posterior left apex on axial image 6, coronal image 80, and sagittal image 98 measuring 11 by 13 by 8 mm in transverse, craniocaudal, and AP dimensions with a mean diameter of 11 mm. No other suspicious nodules or masses. Upper Abdomen: No changes since June of 2022. Musculoskeletal: Severe compression fracture at L1, unchanged since June of 2022. Review of the MIP images confirms the above findings. IMPRESSION: 1. No pulmonary emboli. 2. 11 mm nodule in the left apex posteriorly. Consider one of the following in 3 months for both low-risk and high-risk individuals: (a) repeat chest CT, (b) follow-up PET-CT, or (c) tissue sampling. This recommendation follows the consensus statement: Guidelines for Management of Incidental Pulmonary Nodules Detected on CT Images: From the Fleischner Society 2017; Radiology 2017; 284:228-243. 3. Patchy nodularity in the right middle lobe, likely infectious or inflammatory. Recommend attention on follow-up. 4. Calcified atherosclerosis in the left coronary arteries as above. Calcified atherosclerosis in the thoracic aorta. 5. Mild cardiomegaly. 6. Small pleural effusions with associated atelectasis. 7. A single mildly abnormal mediastinal node on series 5, image 45 measures 12 mm, nonspecific. This could be reactive. Recommend attention on follow-up. 8. Severe chronic compression fracture of L1. Aortic Atherosclerosis (ICD10-I70.0). Electronically Signed   By: Dorise Bullion III  M.D.   On: 05/05/2021 17:52   DG CHEST PORT 1 VIEW  Result Date:  05/04/2021 CLINICAL DATA:  Hypoxemia.  GI bleed. EXAM: PORTABLE CHEST 1 VIEW COMPARISON:  05/03/2021 FINDINGS: The patient is rotated to the right with unchanged cardiomediastinal silhouette. Aortic atherosclerosis is noted. Patchy bibasilar airspace opacities of mildly increased. Small pleural effusions are questioned. No pneumothorax is identified although the patient's chin obscures the right lung apex. Degenerative changes are again noted involving the right greater than left shoulders. IMPRESSION: Mildly increased bibasilar opacities which may reflect infection or atelectasis. Possible small pleural effusions. Electronically Signed   By: Logan Bores M.D.   On: 05/04/2021 18:53   ECHOCARDIOGRAM COMPLETE  Result Date: 05/05/2021    ECHOCARDIOGRAM REPORT   Patient Name:   MAKINSLEY SCHIAVI Date of Exam: 05/05/2021 Medical Rec #:  [id number]     Height:       64.0 in Accession #:    [id number]    Weight:       179.9 lb Date of Birth:  1940-07-21     BSA:          1.870 m Patient Age:    54 years      BP:           119/72 mmHg Patient Gender: F             HR:           90 bpm. Exam Location:  Inpatient Procedure: 2D Echo, Cardiac Doppler and Color Doppler Indications:    Dsypnea  History:        Patient has prior history of Echocardiogram examinations, most                 recent 09/11/2017.  Sonographer:    Merrie Roof RDCS Referring Phys: [place]  1. Left ventricular ejection fraction, by estimation, is 60 to 65%. The left ventricle has normal function. The left ventricle has no regional wall motion abnormalities. Left ventricular diastolic parameters are indeterminate.  2. Right ventricular systolic function is normal. The right ventricular size is normal. There is moderately elevated pulmonary artery systolic pressure.  3. Left atrial size was mildly dilated.  4. Right atrial size was mildly dilated.  5. The mitral  valve is abnormal. Mild mitral valve regurgitation. No evidence of mitral stenosis. Moderate mitral annular calcification.  6. The aortic valve is tricuspid. There is moderate calcification of the aortic valve. Aortic valve regurgitation is mild. Mild to moderate aortic valve sclerosis/calcification is present, without any evidence of aortic stenosis.  7. The inferior vena cava is dilated in size with >50% respiratory variability, suggesting right atrial pressure of 8 mmHg. FINDINGS  Left Ventricle: Left ventricular ejection fraction, by estimation, is 60 to 65%. The left ventricle has normal function. The left ventricle has no regional wall motion abnormalities. The left ventricular internal cavity size was normal in size. There is  no left ventricular hypertrophy. Left ventricular diastolic parameters are indeterminate. Right Ventricle: The right ventricular size is normal. No increase in right ventricular wall thickness. Right ventricular systolic function is normal. There is moderately elevated pulmonary artery systolic pressure. The tricuspid regurgitant velocity is 3.38 m/s, and with an assumed right atrial pressure of 8 mmHg, the estimated right ventricular systolic pressure is 84.6 mmHg. Left Atrium: Left atrial size was mildly dilated. Right Atrium: Right atrial size was mildly dilated. Pericardium: There is no evidence of pericardial effusion. Mitral Valve: The mitral valve is abnormal. There is moderate thickening of the mitral valve leaflet(s). There is moderate calcification of  the mitral valve leaflet(s). Moderate mitral annular calcification. Mild mitral valve regurgitation. No evidence of mitral valve stenosis. Tricuspid Valve: The tricuspid valve is normal in structure. Tricuspid valve regurgitation is not demonstrated. No evidence of tricuspid stenosis. Aortic Valve: The aortic valve is tricuspid. There is moderate calcification of the aortic valve. Aortic valve regurgitation is mild. Mild to  moderate aortic valve sclerosis/calcification is present, without any evidence of aortic stenosis. Aortic valve mean gradient measures 6.0 mmHg. Aortic valve peak gradient measures 11.4 mmHg. Aortic valve area, by VTI measures 2.20 cm. Pulmonic Valve: The pulmonic valve was normal in structure. Pulmonic valve regurgitation is not visualized. No evidence of pulmonic stenosis. Aorta: The aortic root is normal in size and structure. Venous: The inferior vena cava is dilated in size with greater than 50% respiratory variability, suggesting right atrial pressure of 8 mmHg. IAS/Shunts: The interatrial septum was not well visualized.  LEFT VENTRICLE PLAX 2D LVIDd:         4.30 cm   Diastology LVIDs:         2.80 cm   LV e' medial:    10.10 cm/s LV PW:         1.00 cm   LV E/e' medial:  15.6 LV IVS:        1.00 cm   LV e' lateral:   9.25 cm/s LVOT diam:     1.80 cm   LV E/e' lateral: 17.1 LV SV:         69 LV SV Index:   37 LVOT Area:     2.54 cm  RIGHT VENTRICLE          IVC RV Basal diam:  3.60 cm  IVC diam: 2.40 cm LEFT ATRIUM              Index        RIGHT ATRIUM           Index LA diam:        3.70 cm  1.98 cm/m   RA Area:     25.10 cm LA Vol (A2C):   100.0 ml 53.47 ml/m  RA Volume:   82.00 ml  43.84 ml/m LA Vol (A4C):   72.3 ml  38.66 ml/m LA Biplane Vol: 86.1 ml  46.04 ml/m  AORTIC VALVE AV Area (Vmax):    2.15 cm AV Area (Vmean):   2.14 cm AV Area (VTI):     2.20 cm AV Vmax:           169.00 cm/s AV Vmean:          117.000 cm/s AV VTI:            0.314 m AV Peak Grad:      11.4 mmHg AV Mean Grad:      6.0 mmHg LVOT Vmax:         143.00 cm/s LVOT Vmean:        98.500 cm/s LVOT VTI:          0.272 m LVOT/AV VTI ratio: 0.87  AORTA Ao Root diam: 2.90 cm Ao Asc diam:  2.80 cm MITRAL VALVE                TRICUSPID VALVE MV Area (PHT): 4.49 cm     TR Peak grad:   45.7 mmHg MV Decel Time: 169 msec     TR Vmax:        338.00 cm/s MV E velocity: 158.00 cm/s MV A velocity: 151.00  cm/s  SHUNTS MV E/A ratio:  1.05          Systemic VTI:  0.27 m                             Systemic Diam: 1.80 cm Jenkins Rouge MD Electronically signed by Jenkins Rouge MD Signature Date/Time: 05/05/2021/3:15:29 PM    Final    VAS [place] LOWER EXTREMITY VENOUS (DVT)  Result Date: 05/05/2021  Lower Venous DVT Study Patient Name:  SANDRA BRENTS  Date of Exam:   05/05/2021 Medical Rec #: [id number]      Accession #:    [id number] Date of Birth: Apr 19, 1941      Patient Gender: F Patient Age:   18 years Exam Location:  [facility] Procedure:      VAS [place] LOWER EXTREMITY VENOUS (DVT) Referring Phys: [facility] HARRIS --------------------------------------------------------------------------------  Indications: Swelling, and SOB.  Risk Factors: COPD. GI Bleed. Comparison Study: No prior study on file Performing Technologist: Sharion Dove RVS  Examination Guidelines: A complete evaluation includes B-mode imaging, spectral Doppler, color Doppler, and power Doppler as needed of all accessible portions of each vessel. Bilateral testing is considered an integral part of a complete examination. Limited examinations for reoccurring indications may be performed as noted. The reflux portion of the exam is performed with the patient in reverse Trendelenburg.  +---------+---------------+---------+-----------+----------+-------------------+ RIGHT    CompressibilityPhasicitySpontaneityPropertiesThrombus Aging      +---------+---------------+---------+-----------+----------+-------------------+ CFV      Full                                         pulsatile waveforms +---------+---------------+---------+-----------+----------+-------------------+ SFJ      Full                                                             +---------+---------------+---------+-----------+----------+-------------------+ FV Prox  Full                                                              +---------+---------------+---------+-----------+----------+-------------------+ FV Mid   Full                                                             +---------+---------------+---------+-----------+----------+-------------------+ FV DistalFull                                                             +---------+---------------+---------+-----------+----------+-------------------+ PFV      Full                                                             +---------+---------------+---------+-----------+----------+-------------------+  POP      Full                                         pulsatile waveforms +---------+---------------+---------+-----------+----------+-------------------+ PTV      Full                                                             +---------+---------------+---------+-----------+----------+-------------------+ PERO     Full                                                             +---------+---------------+---------+-----------+----------+-------------------+   +---------+---------------+---------+-----------+----------+-------------------+ LEFT     CompressibilityPhasicitySpontaneityPropertiesThrombus Aging      +---------+---------------+---------+-----------+----------+-------------------+ CFV      Full                                         pulsatile waveforms +---------+---------------+---------+-----------+----------+-------------------+ SFJ      Full                                                             +---------+---------------+---------+-----------+----------+-------------------+ FV Prox  Full                                                             +---------+---------------+---------+-----------+----------+-------------------+ FV Mid   Full                                                             +---------+---------------+---------+-----------+----------+-------------------+ FV  DistalFull                                                             +---------+---------------+---------+-----------+----------+-------------------+ PFV      Full                                                             +---------+---------------+---------+-----------+----------+-------------------+ POP  pulsatile waveforms +---------+---------------+---------+-----------+----------+-------------------+ PTV      Full                                                             +---------+---------------+---------+-----------+----------+-------------------+ PERO     Full                                                             +---------+---------------+---------+-----------+----------+-------------------+     Summary: RIGHT: - There is no evidence of deep vein thrombosis in the lower extremity.  Pulsatile waveforms suggestive of fluid overload  LEFT: - There is no evidence of deep vein thrombosis in the lower extremity.  Pulsatile waveforms suggestive of fluid overload.  *See table(s) above for measurements and observations. Electronically signed by Monica Martinez MD on 05/05/2021 at 3:35:10 PM.    Final    VAS [place] UPPER EXTREMITY VENOUS DUPLEX  Result Date: 05/05/2021 UPPER VENOUS STUDY  Patient Name:  TEELA NARDUCCI  Date of Exam:   05/05/2021 Medical Rec #: [id number]      Accession #:    [id number] Date of Birth: 1941-05-13      Patient Gender: F Patient Age:   56 years Exam Location:  [facility] Procedure:      VAS [place] UPPER EXTREMITY VENOUS DUPLEX Referring Phys: Loree Fee HARRIS --------------------------------------------------------------------------------  Indications: Edema Risk Factors: COPD, GI Bleed. Limitations: Poor ultrasound/tissue interface and edema. Comparison Study: No prior study on file Performing Technologist: Sharion Dove RVS  Examination Guidelines: A complete evaluation includes B-mode  imaging, spectral Doppler, color Doppler, and power Doppler as needed of all accessible portions of each vessel. Bilateral testing is considered an integral part of a complete examination. Limited examinations for reoccurring indications may be performed as noted.  Right Findings: +----------+------------+---------+-----------+----------+-------------------+ RIGHT     CompressiblePhasicitySpontaneousProperties      Summary       +----------+------------+---------+-----------+----------+-------------------+ IJV           Full                                  pulsatile waveforms +----------+------------+---------+-----------+----------+-------------------+ Subclavian                                          pulsatile waveforms +----------+------------+---------+-----------+----------+-------------------+ Axillary                                            pulsatile waveforms +----------+------------+---------+-----------+----------+-------------------+ Brachial      Full                                  pulsatile waveforms +----------+------------+---------+-----------+----------+-------------------+ Radial        Full                                                      +----------+------------+---------+-----------+----------+-------------------+  Ulnar                                                 patent by color   +----------+------------+---------+-----------+----------+-------------------+ Cephalic    Partial                                  Age Indeterminate  +----------+------------+---------+-----------+----------+-------------------+ Basilic       Full                                                      +----------+------------+---------+-----------+----------+-------------------+  Left Findings: +----------+------------+---------+-----------+----------+-------------------+ LEFT      CompressiblePhasicitySpontaneousProperties      Summary        +----------+------------+---------+-----------+----------+-------------------+ Subclavian                                          pulsatile waveforms +----------+------------+---------+-----------+----------+-------------------+  Summary:  Right: No evidence of deep vein thrombosis in the upper extremity. Findings consistent with non occlusive, age indeterminate superficial vein thrombosis involving a small segment of the right cephalic vein at the distal upper arm. Waveforms are pulsatile suggestive of fluid overload.  Left: No evidence of thrombosis in the subclavian.  *See table(s) above for measurements and observations.  Diagnosing physician: Monica Martinez MD Electronically signed by Monica Martinez MD on 05/05/2021 at 3:35:36 PM.    Final         Scheduled Meds:  arformoterol  15 mcg Nebulization BID   Chlorhexidine Gluconate Cloth  6 each Topical Daily   dextrose  25 mL Intravenous Once   dorzolamide-timolol  1 drop Both Eyes BID   latanoprost  1 drop Both Eyes QHS   [START ON 05/07/2021] methylPREDNISolone (SOLU-MEDROL) injection  40 mg Intravenous Daily   modafinil  100 mg Oral Daily   ondansetron  4 mg Oral Q12H   pantoprazole (PROTONIX) IV  40 mg Intravenous Q12H   revefenacin  175 mcg Nebulization Daily   sertraline  150 mg Oral Daily   simvastatin  20 mg Oral q1800   Continuous Infusions:  sodium chloride Stopped (05/06/21 0547)   ampicillin-sulbactam (UNASYN) IV 200 mL/hr at 05/06/21 0600   potassium chloride       LOS: 3 days    Time spent: 35 minutes.     Elmarie Shiley, MD [facility]   If 7PM-7AM, please contact night-coverage [email or web address]  05/06/2021, 12:11 PM

## 2021-05-06 NOTE — Evaluation (Signed)
Clinical/Bedside Swallow Evaluation Patient Details  Name: [name] MRN: [id number] Date of Birth: 1941/02/14  Today's Date: 05/06/2021 Time: SLP Start Time (ACUTE ONLY): 31 SLP Stop Time (ACUTE ONLY): 1355 SLP Time Calculation (min) (ACUTE ONLY): 20 min  Past Medical History:  Past Medical History:  Diagnosis Date   Anxiety    Arthritis    "bilateral knees, shoulders, elbows; neck, pretty widespread" (09/05/2017)   BPPV (benign paroxysmal positional vertigo)    Depression    GERD (gastroesophageal reflux disease)    Glaucoma, both eyes    Headache    "probably 2/month" (09/05/2017)   History of blood transfusion ~ 2008   "related to LGIB"   Hyperlipemia    Lower GI bleeding ~ 2008; 09/05/2017   "had to have blood transfusion"   Macular degeneration, bilateral    Osteopenia    Seasonal allergies    Skin cancer, basal cell 2001   "off my nose, left side"   Sleeping excessive    Tinnitus of both ears    Past Surgical History:  Past Surgical History:  Procedure Laterality Date   BASAL CELL CARCINOMA EXCISION  2001   "off my nose, left side"   BLEPHAROPLASTY Bilateral    CATARACT EXTRACTION W/ INTRAOCULAR LENS  IMPLANT, BILATERAL Bilateral 1990's   EYE SURGERY Bilateral    "to improve vision after cataract OR"   IR ANGIOGRAM FOLLOW UP STUDY  12/19/2020   IR ANGIOGRAM SELECTIVE EACH ADDITIONAL VESSEL  12/19/2020   IR ANGIOGRAM SELECTIVE EACH ADDITIONAL VESSEL  12/19/2020   IR ANGIOGRAM VISCERAL SELECTIVE  12/19/2020   IR EMBO ART  VEN HEMORR LYMPH EXTRAV  INC GUIDE ROADMAPPING  12/19/2020   IR US GUIDE VASC ACCESS RIGHT  12/19/2020   JOINT REPLACEMENT     STAPEDES SURGERY Left    "scraped stapedes because it was sticking when it wasn't suppose to"   Pantops Left ~ 2008   TOTAL KNEE ARTHROPLASTY  12/20/2011   Procedure: TOTAL KNEE ARTHROPLASTY;  Surgeon: Augustin Schooling, MD;  Location: [facility];  Service: Orthopedics;   Laterality: Right;  Right Total Knee Arthroplasty   TUBAL LIGATION  1980's   HPI:  80 y.o. female presents to [facility] hospital on 05/01/2021 with low hemoglobin and ongoing rectal bleeding. PMH includes COPD on home oxygen, hypertension. 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    Assessment / Plan / Recommendation  Clinical Impression  Patient presents with clinicial s/s of dysphagia with SLP suspecting swallow initiation delay with sips of thin liquids (water). No other PO consistencies attempted as MD is keeping patient NPO pending GI assessment. Patient did not exhibit any coughing, throat clearing, change in vocal quality . SLP is recommending to initiate clear liquids vs full liquids when cleared by GI. (plan is for barium swallow next date). SLP will f/u with patient after barium test completed by GI. SLP Visit Diagnosis: Dysphagia, unspecified (R13.10)    Aspiration Risk  Mild aspiration risk;Moderate aspiration risk    Diet Recommendation Thin liquid   Liquid Administration via: Cup;Straw Medication Administration: Crushed with puree Supervision: Full supervision/cueing for compensatory strategies;Staff to assist with self feeding Compensations: Slow rate;Small sips/bites Postural Changes: Seated upright at 90 degrees;Remain upright for at least 30 minutes after po intake    Other  Recommendations Oral Care Recommendations: Oral care BID    Recommendations for follow up therapy are one component of a multi-disciplinary discharge planning  process, led by the attending physician.  Recommendations may be updated based on patient status, additional functional criteria and insurance authorization.  Follow up Recommendations Other (comment) (TBD)      Frequency and Duration min 1 x/week          Prognosis Prognosis for Safe Diet Advancement: Good      Swallow Study   General Date of Onset: 05/04/21 HPI: 80 y.o. female presents to [facility] hospital on 05/01/2021  with low hemoglobin and ongoing rectal bleeding. PMH includes COPD on home oxygen, hypertension. 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 Type of Study: Bedside Swallow Evaluation Previous Swallow Assessment: BSE during previous admission 2019 Diet Prior to this Study: NPO Temperature Spikes Noted: No Respiratory Status: Nasal cannula History of Recent Intubation: No Behavior/Cognition: Alert;Cooperative;Pleasant mood Oral Cavity Assessment: Within Functional Limits Oral Care Completed by SLP: Yes Oral Cavity - Dentition: Adequate natural dentition Patient Positioning: Upright in bed Baseline Vocal Quality: Normal Volitional Cough: Strong Volitional Swallow: Able to elicit    Oral/Motor/Sensory Function Overall Oral Motor/Sensory Function: Within functional limits   Ice Chips     Thin Liquid Thin Liquid: Impaired Presentation: Straw Pharyngeal  Phase Impairments: Suspected delayed Swallow    Nectar Thick     Honey Thick     Puree Puree: Not tested   Solid     Solid: Not tested     Sonia Baller, MA, CCC-SLP Speech Therapy

## 2021-05-06 NOTE — Progress Notes (Signed)
NAME:  [name], MRN:  [id number], DOB:  [birth year], LOS: 3 ADMISSION DATE:  05/01/2021, CONSULTATION DATE:  05/04/21 REFERRING MD:  Tyrell Antonio, CHIEF COMPLAINT:  BRBPR   History of Present Illness:  80 year old woman with O2-dependent baseline respiratory failure attributed to COPD (no PFTs in system), prior diverticular bleed requiring IR embolization who presented with ABLA self-limited and treated with transfusion.  Throughout day has become more somnolent and hypoxemic.  CXR with bibasilar infiltrates for which patient has been started on abx.  ABG with CO2 retention started on BIPAP.  Remains somnolent on this with worsening O2 on ABG prompting PCCM evaluation.  Patient currently denies pain but is slow to respond.  Pertinent  Medical History  GERD Depression Anxiety Headache LGIB COPD Chronic hypoxic resp failure on home O2, 2L qHS  Significant Hospital Events:  11/3 admitted, GI eval 11/3-11/4 worsening somnolence 11/5 No acute events overnight, more alert and interactive this a.m. on BiPAP.    Interim History / Subjective:  Now on [place], 3L No resp complaints, states she feels like food gets stuck, has a lump in her chest, ok with liquids, - this is not new, was supposed to see GI prior for.    Objective   Blood pressure 110/66, pulse 78, temperature 97.6 F (36.4 C), temperature source Oral, resp. rate 10, weight 81.6 kg, SpO2 100 %.    FiO2 (%):  [30 %] 30 %   Intake/Output Summary (Last 24 hours) at 05/06/2021 0951 Last data filed at 05/06/2021 0600 Gross per 24 hour  Intake 274.72 ml  Output 4900 ml  Net -4625.28 ml   Filed Weights   05/03/21 1300  Weight: 81.6 kg    Examination: General:  Frail elderly female sitting upright in bed eating graham crackers HEENT: MM pink/moist, poor dentition Neuro:  Alert, oriented, MAE, generally weak CV: rr PULM:  non labored, clear, diminished in bases -  no rales/ wheezes - weaned down to 1L [place] during my exam, O2 sats  93-96% GI:  obese, soft, +bs, NT Extremities: warm/dry, generalized +1 edema  Skin: no rashes   Resolved Hospital Problem list   N/a  Assessment & Plan:  Acute on chronic hypoxemic and hypercarbic respiratory failure Possible ASA PNA / AECOPD - Carries diagnosis of COPD; poor air movement on exam, bibasilar infiltrates.  Agree with tx as aspiration and AECOPD.   - on home O2 q HS, 2L  prior to admit P: No further BiPAP needed  Supplemental O2 for sat goal >88% Continue diuresis per primary team Continue albuterol prn, brovanna/ yupelri-> would recommend albuterol neb and either trelegy or Brestri on discharge She may follow-up in our pulmonary office, last PFTs 2019, office will call  Decrease solumedrol to 40mg  daily and taper Complete unasyn for 5 days of completion, day 4/5 Aggressive pulmonary hygiene/ IS/ mobilize w/ PT/OT UE/ LE dupplex >> RUE > non occlusive, age indeterminate superficial vein thrombosis involving a small segment of the right cephalic vein at the distal upper arm.  Other dopplers negative, but noted pulsatile waveforms suggestive of fluid overload in extremities.  Continue PPI  Will need further GI eval for c/o of food getting stuck as above  PCCM will sign off and be available as needed.   Lower GI bleed - c/w diverticular, no signs of recurrence  P: Trend CBC Monitor for signs of bleeding Hemoglobin remained stable  Best Practice (right click and "Reselect all SmartList Selections" daily)   Diet/type: NPO-  ok to resume diet from pulmonary standpoint if swallowing ok DVT prophylaxis: SCD GI prophylaxis: PPI Lines: N/A Foley:  N/A Code Status:  full code- consider palliative care consult.  Patient seems overwhelmed by recent declining health and mobility.  Last date of multidisciplinary goals of care discussion [per primary team]  Critical care time: n/a      Kennieth Rad, ACNP [facility] 05/06/2021, 9:52 AM  See  Amion for pager If no response to pager, please call PCCM consult pager After 7:00 pm call Elink

## 2021-05-06 NOTE — Consult Note (Signed)
[facility] Consultation Note  Referring Provider: [facility] Primary Care Physician:  [name], MD Primary Gastroenterologist:  Dr. Cristina Gong  Reason for Consultation:  dysphagia  HPI: [name] is a 80 y.o. female admitted hematochezia (resolved) and acute on chronic respiratory failure.  These have returned near baseline though patient reports still feeling "sick."  Has had years' of dysphagia, not further specified despite requests, but does feel pills can get stuck the most.  Reports no endoscopy or recent esophagram.   Past Medical History:  Diagnosis Date   Anxiety    Arthritis    "bilateral knees, shoulders, elbows; neck, pretty widespread" (09/05/2017)   BPPV (benign paroxysmal positional vertigo)    Depression    GERD (gastroesophageal reflux disease)    Glaucoma, both eyes    Headache    "probably 2/month" (09/05/2017)   History of blood transfusion ~ 2008   "related to LGIB"   Hyperlipemia    Lower GI bleeding ~ 2008; 09/05/2017   "had to have blood transfusion"   Macular degeneration, bilateral    Osteopenia    Seasonal allergies    Skin cancer, basal cell 2001   "off my nose, left side"   Sleeping excessive    Tinnitus of both ears     Past Surgical History:  Procedure Laterality Date   BASAL CELL CARCINOMA EXCISION  2001   "off my nose, left side"   BLEPHAROPLASTY Bilateral    CATARACT EXTRACTION W/ INTRAOCULAR LENS  IMPLANT, BILATERAL Bilateral 1990's   EYE SURGERY Bilateral    "to improve vision after cataract OR"   IR ANGIOGRAM FOLLOW UP STUDY  12/19/2020   IR ANGIOGRAM SELECTIVE EACH ADDITIONAL VESSEL  12/19/2020   IR ANGIOGRAM SELECTIVE EACH ADDITIONAL VESSEL  12/19/2020   IR ANGIOGRAM VISCERAL SELECTIVE  12/19/2020   IR EMBO ART  VEN HEMORR LYMPH EXTRAV  INC GUIDE ROADMAPPING  12/19/2020   IR US GUIDE VASC ACCESS RIGHT  12/19/2020   JOINT REPLACEMENT     STAPEDES SURGERY Left    "scraped stapedes because it was sticking when it wasn't suppose to"    [place] Left ~ 2008   TOTAL KNEE ARTHROPLASTY  12/20/2011   Procedure: TOTAL KNEE ARTHROPLASTY;  Surgeon: Augustin Schooling, MD;  Location: [place];  Service: Orthopedics;  Laterality: Right;  Right Total Knee Arthroplasty   TUBAL LIGATION  1980's    Prior to Admission medications   Medication Sig Start Date End Date Taking? Authorizing Provider  acetaminophen (TYLENOL) 500 MG tablet Take 1,000 mg by mouth daily as needed for headache.   Yes [provider]  albuterol (PROVENTIL HFA;VENTOLIN HFA) 108 (90 Base) MCG/ACT inhaler Inhale 2 puffs into the lungs daily as needed. For seasonal allergies . Use 2 puffs 3 times daily x 4 days then back to home regimen. Patient taking differently: Inhale 2 puffs into the lungs daily as needed for wheezing or shortness of breath. 08/10/17  Yes Eugenie Filler, MD  Cholecalciferol (VITAMIN D3 PO) Take 1 tablet by mouth daily.   Yes [provider]  dorzolamide-timolol (COSOPT) 22.3-6.8 MG/ML ophthalmic solution Place 1 drop into both eyes 2 (two) times daily.   Yes [provider]  HYDROcodone-acetaminophen (NORCO/VICODIN) 5-325 MG tablet Take 1 tablet by mouth every 6 (six) hours as needed for moderate pain. 12/25/20  Yes [provider]  latanoprost (XALATAN) 0.005 % ophthalmic solution Place 1 drop into both eyes at bedtime.  02/13/18  Yes [provider]  loratadine (CLARITIN) 10 MG tablet Take 1 tablet (10 mg total) by mouth daily. 08/11/17  Yes Eugenie Filler, MD  Multiple Vitamin (MULTIVITAMIN WITH MINERALS) TABS Take 1 tablet by mouth daily.   Yes [provider]  omeprazole (PRILOSEC) 20 MG capsule Take 20 mg by mouth every evening.   Yes [provider]  OXYGEN 2lpm with sleep only  [employer]   Yes [provider]  propranolol (INDERAL) 20 MG tablet Take 20 mg by mouth See admin instructions. 20 mg qd  20 mg  as needed for  heartracing 04/30/21  Yes [provider]  rOPINIRole (REQUIP) 0.5 MG tablet Take 1 tablet (0.5 mg total) by mouth at bedtime. Patient taking differently: Take 0.5 mg by mouth at bedtime as needed (restless legs). 12/23/20  Yes Nita Sells, MD  sertraline (ZOLOFT) 100 MG tablet Take 1 tablet (100 mg total) by mouth daily. Patient taking differently: Take 150 mg by mouth daily. 12/23/20  Yes Nita Sells, MD  simvastatin (ZOCOR) 20 MG tablet Take 20 mg by mouth daily at 6 PM.   Yes [provider]  vitamin C (ASCORBIC ACID) 500 MG tablet Take 500 mg by mouth daily.   Yes [provider]  ALPRAZolam (XANAX) 0.25 MG tablet Take 1 tablet (0.25 mg total) by mouth daily as needed for anxiety. Patient not taking: No sig reported 09/14/17   Aline August, MD  DULoxetine (CYMBALTA) 20 MG capsule Take 1 capsule (20 mg total) by mouth daily. Patient not taking: No sig reported 12/23/20   Nita Sells, MD  nitrofurantoin, macrocrystal-monohydrate, (MACROBID) 100 MG capsule Take 100 mg by mouth See admin instructions. hs x 7 days Patient not taking: No sig reported 01/15/21   [provider]  sulfamethoxazole-trimethoprim (BACTRIM DS) 800-160 MG tablet Take 1 tablet by mouth See admin instructions. Bid x 7 days Patient not taking: Reported on 05/02/2021 01/24/21   [provider]    Current Facility-Administered Medications  Medication Dose Route Frequency Provider Last Rate Last Admin   0.9 %  sodium chloride infusion   Intravenous PRN Regalado, Belkys A, MD   Paused at 05/06/21 1346   acetaminophen (TYLENOL) tablet 650 mg  650 mg Oral Q6H PRN Regalado, Belkys A, MD   650 mg at 05/05/21 1610   albuterol (PROVENTIL) (2.5 MG/3ML) 0.083% nebulizer solution 2.5 mg  2.5 mg Nebulization Q4H PRN Jennelle Human B, NP       Ampicillin-Sulbactam (UNASYN) 3 g in sodium chloride 0.9 % 100 mL IVPB  3 g Intravenous Q8H Regalado, Belkys A, MD 200 mL/hr at  05/06/21 1400 Infusion Verify at 05/06/21 1400   arformoterol (BROVANA) nebulizer solution 15 mcg  15 mcg Nebulization BID Candee Furbish, MD   15 mcg at 05/06/21 8502   Chlorhexidine Gluconate Cloth 2 % PADS 6 each  6 each Topical Daily Regalado, Belkys A, MD   6 each at 05/06/21 0800   dextrose 50 % solution 25 mL  25 mL Intravenous Once Regalado, Belkys A, MD       dorzolamide-timolol (COSOPT) 22.3-6.8 MG/ML ophthalmic solution 1 drop  1 drop Both Eyes BID Regalado, Belkys A, MD   1 drop at 05/06/21 0911   latanoprost (XALATAN) 0.005 % ophthalmic solution 1 drop  1 drop Both Eyes QHS Regalado, Belkys A, MD   1 drop at 05/05/21 2126   [START ON 05/07/2021] methylPREDNISolone sodium succinate (SOLU-MEDROL) 40 mg/mL injection 40 mg  40 mg Intravenous Daily Jennelle Human B, NP       modafinil (PROVIGIL) tablet 100 mg  100 mg Oral Daily Candee Furbish, MD   100 mg at 05/06/21 0912   ondansetron (ZOFRAN-ODT) disintegrating tablet 4 mg  4 mg Oral Q12H Regalado, Belkys A, MD   4 mg at 05/06/21 0912   pantoprazole (PROTONIX) injection 40 mg  40 mg Intravenous Q12H Regalado, Belkys A, MD   40 mg at 05/06/21 1233   propranolol (INDERAL) tablet 5 mg  5 mg Oral BID PRN Regalado, Belkys A, MD       revefenacin (YUPELRI) nebulizer solution 175 mcg  175 mcg Nebulization Daily Candee Furbish, MD   175 mcg at 05/06/21 0093   sertraline (ZOLOFT) tablet 150 mg  150 mg Oral Daily Regalado, Belkys A, MD   150 mg at 05/06/21 0911   simvastatin (ZOCOR) tablet 20 mg  20 mg Oral q1800 Regalado, Belkys A, MD   20 mg at 05/05/21 1745    Allergies as of 05/01/2021 - Review Complete 05/01/2021  Allergen Reaction Noted   Morphine and related Itching 11/29/2011   Abaloparatide Other (See Comments) 10/22/2020   Aspirin Other (See Comments) 10/22/2020   Duloxetine hcl Other (See Comments) 10/22/2020   Ventolin [albuterol]  12/25/2020   Cefadroxil Hives 12/11/2011    Family History  Problem Relation Age of Onset    Heart attack Mother    Heart disease Father    Heart failure Father    Bladder Cancer Father    Supraventricular tachycardia Sister    Hypertension Sister     Social History   Socioeconomic History   Marital status: Married    Spouse name: Clair Gulling    Number of children: 3   Years of education: College   Highest education level: Not on file  Occupational History   Occupation: Retired Pharmacist, hospital  Tobacco Use   Smoking status: Former    Packs/day: 0.75    Years: 37.00    Pack years: 27.75    Types: Cigarettes    Quit date: 07/01/1992    Years since quitting: 28.8   Smokeless tobacco: Never  Vaping Use   Vaping Use: Never used  Substance and Sexual Activity   Alcohol use: Yes    Alcohol/week: 7.0 standard drinks    Types: 7 Glasses of wine per week    Comment: 09/05/2017  "6 oz wine, 5 days/wk"   Drug use: No    Types: Hydrocodone   Sexual activity: Not Currently  Other Topics Concern   Not on file  Social History Narrative   1-2 cups of coffee a day    Social Determinants of Health   Financial Resource Strain: Not on file  Food Insecurity: Not on file  Transportation Needs: Not on file  Physical Activity: Not on file  Stress: Not on file  Social Connections: Not on file  Intimate Partner Violence: Not on file    Review of Systems: As per HPI, all others negative  Physical Exam: Vital signs in last 24 hours: Temp:  [97.6 F (36.4 C)-98.5 F (36.9 C)] 97.8 F (36.6 C) (11/06 1220) Pulse Rate:  [69-98] 83 (11/06 1400) Resp:  [10-22] 22 (11/06 1400) BP: (81-122)/(46-88) 108/66 (11/06 1400) SpO2:  [94 %-100 %] 94 % (11/06 1400) Last BM Date: 05/02/21 General:   Alert,  overweight, pleasant and cooperative in NAD Head:  Normocephalic and atraumatic. Eyes:  Sclera clear, no icterus.   Conjunctiva pale Ears:  Normal auditory acuity. Nose:  No deformity, discharge,  or lesions. Mouth:  No deformity or lesions.  Oropharynx pale and somewhat dry Neck:  Supple; no masses  or thyromegaly. Heart:  Regular Abdomen:  Soft, nontender and nondistended. No masses, hepatosplenomegaly or hernias noted. Normal bowel sounds, without guarding, and without rebound.     Msk:  Symmetrical without gross deformities. Normal posture. Pulses:  Normal pulses noted. Extremities:  Without clubbing or edema. Neurologic:  Alert and  oriented x4; diffusely weak, otherwise grossly normal neurologically. Skin:  Intact without significant lesions or rashes. Psych:  Alert and cooperative. Normal mood and affect.   Lab Results: Recent Labs    05/04/21 1817 05/04/21 2049 05/05/21 0009 05/06/21 0227  WBC 6.5  --  7.1 6.3  HGB 8.1* 8.2* 8.7* 8.9*  HCT 26.8* 24.0* 28.9* 28.2*  PLT 160  --  161 192   BMET Recent Labs    05/05/21 0009 05/05/21 0802 05/06/21 0227  NA 138 138 135  K 3.9 4.4 3.5  CL 99 100 92*  CO2 30 30 36*  GLUCOSE 106* 95 153*  BUN 7* 8 10  CREATININE 0.57 0.56 0.55  CALCIUM 8.6* 8.7* 8.4*   LFT No results for input(s): PROT, ALBUMIN, AST, ALT, ALKPHOS, BILITOT, BILIDIR, IBILI in the last 72 hours. PT/INR No results for input(s): LABPROT, INR in the last 72 hours.  Studies/Results: CT Angio Chest Pulmonary Embolism (PE) W or WO Contrast  Result Date: 05/05/2021 CLINICAL DATA:  Hypoxia on room air May 03, 2021. Tachycardia. Low O2 sats. Recent chest x-ray demonstrated bibasilar opacities suggesting possible pneumonia. EXAM: CT ANGIOGRAPHY CHEST WITH CONTRAST TECHNIQUE: Multidetector CT imaging of the chest was performed using the standard protocol during bolus administration of intravenous contrast. Multiplanar CT image reconstructions and MIPs were obtained to evaluate the vascular anatomy. CONTRAST:  32mL OMNIPAQUE IOHEXOL 350 MG/ML SOLN COMPARISON:  Chest x-ray May 04, 2021 FINDINGS: Cardiovascular: Calcified atherosclerosis in the left anterior descending coronary artery and circumflex artery. Mild cardiomegaly. Calcified atherosclerosis in the  nonaneurysmal thoracic aorta. No dissection. No pulmonary emboli identified. Mediastinum/Nodes: Small pleural effusions. No pericardial effusion. The visualized esophagus is normal. Visualized thyroid is unremarkable. Mildly enlarged right mediastinal node on series 5, image 45. No other adenopathy. No chest wall abnormalities. Lungs/Pleura: Central airways are normal. No pneumothorax. Platelike opacity in the right middle lobe, likely atelectasis. Patchy nodularity in the adjacent right middle lobe, likely infectious or inflammatory. The largest nodular component measures 4 mm. Opacities associated with the bilateral pleural effusions are likely atelectasis. No other infiltrates. There is a nodule in the posterior left apex on axial image 6, coronal image 80, and sagittal image 98 measuring 11 by 13 by 8 mm in transverse, craniocaudal, and AP dimensions with a mean diameter of 11 mm. No other suspicious nodules or masses. Upper Abdomen: No changes since June of 2022. Musculoskeletal: Severe compression fracture at L1, unchanged since June of 2022. Review of the MIP images confirms the above findings. IMPRESSION: 1. No pulmonary emboli. 2. 11 mm nodule in the left apex posteriorly. Consider one of the following in 3 months for both low-risk and high-risk individuals: (a) repeat chest CT, (b) follow-up PET-CT, or (c) tissue sampling. This recommendation follows the consensus statement: Guidelines for Management of Incidental Pulmonary Nodules Detected on CT Images: From the Fleischner Society 2017; Radiology 2017; 284:228-243. 3. Patchy nodularity in the right middle lobe, likely infectious or inflammatory. Recommend attention on follow-up. 4. Calcified atherosclerosis in the  left coronary arteries as above. Calcified atherosclerosis in the thoracic aorta. 5. Mild cardiomegaly. 6. Small pleural effusions with associated atelectasis. 7. A single mildly abnormal mediastinal node on series 5, image 45 measures 12 mm,  nonspecific. This could be reactive. Recommend attention on follow-up. 8. Severe chronic compression fracture of L1. Aortic Atherosclerosis (ICD10-I70.0). Electronically Signed   By: Dorise Bullion III M.D.   On: 05/05/2021 17:52   DG CHEST PORT 1 VIEW  Result Date: 05/04/2021 CLINICAL DATA:  Hypoxemia.  GI bleed. EXAM: PORTABLE CHEST 1 VIEW COMPARISON:  05/03/2021 FINDINGS: The patient is rotated to the right with unchanged cardiomediastinal silhouette. Aortic atherosclerosis is noted. Patchy bibasilar airspace opacities of mildly increased. Small pleural effusions are questioned. No pneumothorax is identified although the patient's chin obscures the right lung apex. Degenerative changes are again noted involving the right greater than left shoulders. IMPRESSION: Mildly increased bibasilar opacities which may reflect infection or atelectasis. Possible small pleural effusions. Electronically Signed   By: Logan Bores M.D.   On: 05/04/2021 18:53   ECHOCARDIOGRAM COMPLETE  Result Date: 05/05/2021    ECHOCARDIOGRAM REPORT   Patient Name:   NIKITIA ASBILL Date of Exam: 05/05/2021 Medical Rec #:  [id number]     Height:       64.0 in Accession #:    [id number]    Weight:       179.9 lb Date of Birth:  1940-07-18     BSA:          1.870 m Patient Age:    58 years      BP:           119/72 mmHg Patient Gender: F             HR:           90 bpm. Exam Location:  Inpatient Procedure: 2D Echo, Cardiac Doppler and Color Doppler Indications:    Dsypnea  History:        Patient has prior history of Echocardiogram examinations, most                 recent 09/11/2017.  Sonographer:    Merrie Roof RDCS Referring Phys: [place]  1. Left ventricular ejection fraction, by estimation, is 60 to 65%. The left ventricle has normal function. The left ventricle has no regional wall motion abnormalities. Left ventricular diastolic parameters are indeterminate.  2. Right ventricular systolic function is normal.  The right ventricular size is normal. There is moderately elevated pulmonary artery systolic pressure.  3. Left atrial size was mildly dilated.  4. Right atrial size was mildly dilated.  5. The mitral valve is abnormal. Mild mitral valve regurgitation. No evidence of mitral stenosis. Moderate mitral annular calcification.  6. The aortic valve is tricuspid. There is moderate calcification of the aortic valve. Aortic valve regurgitation is mild. Mild to moderate aortic valve sclerosis/calcification is present, without any evidence of aortic stenosis.  7. The inferior vena cava is dilated in size with >50% respiratory variability, suggesting right atrial pressure of 8 mmHg. FINDINGS  Left Ventricle: Left ventricular ejection fraction, by estimation, is 60 to 65%. The left ventricle has normal function. The left ventricle has no regional wall motion abnormalities. The left ventricular internal cavity size was normal in size. There is  no left ventricular hypertrophy. Left ventricular diastolic parameters are indeterminate. Right Ventricle: The right ventricular size is normal. No increase in right ventricular wall thickness. Right ventricular systolic function  is normal. There is moderately elevated pulmonary artery systolic pressure. The tricuspid regurgitant velocity is 3.38 m/s, and with an assumed right atrial pressure of 8 mmHg, the estimated right ventricular systolic pressure is 27.0 mmHg. Left Atrium: Left atrial size was mildly dilated. Right Atrium: Right atrial size was mildly dilated. Pericardium: There is no evidence of pericardial effusion. Mitral Valve: The mitral valve is abnormal. There is moderate thickening of the mitral valve leaflet(s). There is moderate calcification of the mitral valve leaflet(s). Moderate mitral annular calcification. Mild mitral valve regurgitation. No evidence of mitral valve stenosis. Tricuspid Valve: The tricuspid valve is normal in structure. Tricuspid valve regurgitation is  not demonstrated. No evidence of tricuspid stenosis. Aortic Valve: The aortic valve is tricuspid. There is moderate calcification of the aortic valve. Aortic valve regurgitation is mild. Mild to moderate aortic valve sclerosis/calcification is present, without any evidence of aortic stenosis. Aortic valve mean gradient measures 6.0 mmHg. Aortic valve peak gradient measures 11.4 mmHg. Aortic valve area, by VTI measures 2.20 cm. Pulmonic Valve: The pulmonic valve was normal in structure. Pulmonic valve regurgitation is not visualized. No evidence of pulmonic stenosis. Aorta: The aortic root is normal in size and structure. Venous: The inferior vena cava is dilated in size with greater than 50% respiratory variability, suggesting right atrial pressure of 8 mmHg. IAS/Shunts: The interatrial septum was not well visualized.  LEFT VENTRICLE PLAX 2D LVIDd:         4.30 cm   Diastology LVIDs:         2.80 cm   LV e' medial:    10.10 cm/s LV PW:         1.00 cm   LV E/e' medial:  15.6 LV IVS:        1.00 cm   LV e' lateral:   9.25 cm/s LVOT diam:     1.80 cm   LV E/e' lateral: 17.1 LV SV:         69 LV SV Index:   37 LVOT Area:     2.54 cm  RIGHT VENTRICLE          IVC RV Basal diam:  3.60 cm  IVC diam: 2.40 cm LEFT ATRIUM              Index        RIGHT ATRIUM           Index LA diam:        3.70 cm  1.98 cm/m   RA Area:     25.10 cm LA Vol (A2C):   100.0 ml 53.47 ml/m  RA Volume:   82.00 ml  43.84 ml/m LA Vol (A4C):   72.3 ml  38.66 ml/m LA Biplane Vol: 86.1 ml  46.04 ml/m  AORTIC VALVE AV Area (Vmax):    2.15 cm AV Area (Vmean):   2.14 cm AV Area (VTI):     2.20 cm AV Vmax:           169.00 cm/s AV Vmean:          117.000 cm/s AV VTI:            0.314 m AV Peak Grad:      11.4 mmHg AV Mean Grad:      6.0 mmHg LVOT Vmax:         143.00 cm/s LVOT Vmean:        98.500 cm/s LVOT VTI:          0.272 m  LVOT/AV VTI ratio: 0.87  AORTA Ao Root diam: 2.90 cm Ao Asc diam:  2.80 cm MITRAL VALVE                TRICUSPID  VALVE MV Area (PHT): 4.49 cm     TR Peak grad:   45.7 mmHg MV Decel Time: 169 msec     TR Vmax:        338.00 cm/s MV E velocity: 158.00 cm/s MV A velocity: 151.00 cm/s  SHUNTS MV E/A ratio:  1.05         Systemic VTI:  0.27 m                             Systemic Diam: 1.80 cm Jenkins Rouge MD Electronically signed by Jenkins Rouge MD Signature Date/Time: 05/05/2021/3:15:29 PM    Final    VAS [place] LOWER EXTREMITY VENOUS (DVT)  Result Date: 05/05/2021  Lower Venous DVT Study Patient Name:  SHADIAMOND KOSKA  Date of Exam:   05/05/2021 Medical Rec #: [id number]      Accession #:    [id number] Date of Birth: [birth year]      Patient Gender: F Patient Age:   52 years Exam Location:  [facility] Procedure:      VAS [place] LOWER EXTREMITY VENOUS (DVT) Referring Phys: [facility] HARRIS --------------------------------------------------------------------------------  Indications: Swelling, and SOB.  Risk Factors: COPD. GI Bleed. Comparison Study: No prior study on file Performing Technologist: Sharion Dove RVS  Examination Guidelines: A complete evaluation includes B-mode imaging, spectral Doppler, color Doppler, and power Doppler as needed of all accessible portions of each vessel. Bilateral testing is considered an integral part of a complete examination. Limited examinations for reoccurring indications may be performed as noted. The reflux portion of the exam is performed with the patient in reverse Trendelenburg.  +---------+---------------+---------+-----------+----------+-------------------+ RIGHT    CompressibilityPhasicitySpontaneityPropertiesThrombus Aging      +---------+---------------+---------+-----------+----------+-------------------+ CFV      Full                                         pulsatile waveforms +---------+---------------+---------+-----------+----------+-------------------+ SFJ      Full                                                              +---------+---------------+---------+-----------+----------+-------------------+ FV Prox  Full                                                             +---------+---------------+---------+-----------+----------+-------------------+ FV Mid   Full                                                             +---------+---------------+---------+-----------+----------+-------------------+ FV DistalFull                                                             +---------+---------------+---------+-----------+----------+-------------------+  PFV      Full                                                             +---------+---------------+---------+-----------+----------+-------------------+ POP      Full                                         pulsatile waveforms +---------+---------------+---------+-----------+----------+-------------------+ PTV      Full                                                             +---------+---------------+---------+-----------+----------+-------------------+ PERO     Full                                                             +---------+---------------+---------+-----------+----------+-------------------+   +---------+---------------+---------+-----------+----------+-------------------+ LEFT     CompressibilityPhasicitySpontaneityPropertiesThrombus Aging      +---------+---------------+---------+-----------+----------+-------------------+ CFV      Full                                         pulsatile waveforms +---------+---------------+---------+-----------+----------+-------------------+ SFJ      Full                                                             +---------+---------------+---------+-----------+----------+-------------------+ FV Prox  Full                                                             +---------+---------------+---------+-----------+----------+-------------------+ FV  Mid   Full                                                             +---------+---------------+---------+-----------+----------+-------------------+ FV DistalFull                                                             +---------+---------------+---------+-----------+----------+-------------------+ PFV      Full                                                             +---------+---------------+---------+-----------+----------+-------------------+  POP                                                   pulsatile waveforms +---------+---------------+---------+-----------+----------+-------------------+ PTV      Full                                                             +---------+---------------+---------+-----------+----------+-------------------+ PERO     Full                                                             +---------+---------------+---------+-----------+----------+-------------------+     Summary: RIGHT: - There is no evidence of deep vein thrombosis in the lower extremity.  Pulsatile waveforms suggestive of fluid overload  LEFT: - There is no evidence of deep vein thrombosis in the lower extremity.  Pulsatile waveforms suggestive of fluid overload.  *See table(s) above for measurements and observations. Electronically signed by Monica Martinez MD on 05/05/2021 at 3:35:10 PM.    Final    VAS [place] UPPER EXTREMITY VENOUS DUPLEX  Result Date: 05/05/2021 UPPER VENOUS STUDY  Patient Name:  IZUMI MIXON  Date of Exam:   05/05/2021 Medical Rec #: [id number]      Accession #:    [id number] Date of Birth: [birth year]      Patient Gender: F Patient Age:   82 years Exam Location:  [facility] Procedure:      VAS [place] UPPER EXTREMITY VENOUS DUPLEX Referring Phys: Loree Fee HARRIS --------------------------------------------------------------------------------  Indications: Edema Risk Factors: COPD, GI Bleed. Limitations: Poor ultrasound/tissue interface  and edema. Comparison Study: No prior study on file Performing Technologist: Sharion Dove RVS  Examination Guidelines: A complete evaluation includes B-mode imaging, spectral Doppler, color Doppler, and power Doppler as needed of all accessible portions of each vessel. Bilateral testing is considered an integral part of a complete examination. Limited examinations for reoccurring indications may be performed as noted.  Right Findings: +----------+------------+---------+-----------+----------+-------------------+ RIGHT     CompressiblePhasicitySpontaneousProperties      Summary       +----------+------------+---------+-----------+----------+-------------------+ IJV           Full                                  pulsatile waveforms +----------+------------+---------+-----------+----------+-------------------+ Subclavian                                          pulsatile waveforms +----------+------------+---------+-----------+----------+-------------------+ Axillary                                            pulsatile waveforms +----------+------------+---------+-----------+----------+-------------------+ Brachial      Full  pulsatile waveforms +----------+------------+---------+-----------+----------+-------------------+ Radial        Full                                                      +----------+------------+---------+-----------+----------+-------------------+ Ulnar                                                 patent by color   +----------+------------+---------+-----------+----------+-------------------+ Cephalic    Partial                                  Age Indeterminate  +----------+------------+---------+-----------+----------+-------------------+ Basilic       Full                                                      +----------+------------+---------+-----------+----------+-------------------+  Left  Findings: +----------+------------+---------+-----------+----------+-------------------+ LEFT      CompressiblePhasicitySpontaneousProperties      Summary       +----------+------------+---------+-----------+----------+-------------------+ Subclavian                                          pulsatile waveforms +----------+------------+---------+-----------+----------+-------------------+  Summary:  Right: No evidence of deep vein thrombosis in the upper extremity. Findings consistent with non occlusive, age indeterminate superficial vein thrombosis involving a small segment of the right cephalic vein at the distal upper arm. Waveforms are pulsatile suggestive of fluid overload.  Left: No evidence of thrombosis in the subclavian.  *See table(s) above for measurements and observations.  Diagnosing physician: Monica Martinez MD Electronically signed by Monica Martinez MD on 05/05/2021 at 3:35:36 PM.    Final     Impression:   Hematochezia, resolved, suspect diverticular (similar episodes in past).  Hypoxia, resolved, with right lower lobe pneumonia.  On home oxygen, but no over-baseline oxygen requirements at this time.  Anemia, acute blood loss.  Dysphagia.   Chronic problem for years.  Patient not able to provide much more history at this time.  Plan:   Supportive care for anemia and GIB (resolved) and pneumonia.  Barium swallow for dysphagia.  No endoscopy unless significant finding identified.  Next step in management pending esophagram finding.   LOS: 3 days   Magdala Brahmbhatt M  05/06/2021, 2:30 PM  Cell [phone] If no answer or after 5 PM call [phone]

## 2021-05-07 ENCOUNTER — Inpatient Hospital Stay (HOSPITAL_COMMUNITY): Payer: Medicare HMO

## 2021-05-07 DIAGNOSIS — K922 Gastrointestinal hemorrhage, unspecified: Secondary | ICD-10-CM | POA: Diagnosis not present

## 2021-05-07 LAB — CBC
HCT: 26.6 % — ABNORMAL LOW (ref 36.0–46.0)
Hemoglobin: 7.9 g/dL — ABNORMAL LOW (ref 12.0–15.0)
MCH: 24.8 pg — ABNORMAL LOW (ref 26.0–34.0)
MCHC: 29.7 g/dL — ABNORMAL LOW (ref 30.0–36.0)
MCV: 83.6 fL (ref 80.0–100.0)
Platelets: 163 10*3/uL (ref 150–400)
RBC: 3.18 MIL/uL — ABNORMAL LOW (ref 3.87–5.11)
RDW: 20.8 % — ABNORMAL HIGH (ref 11.5–15.5)
WBC: 7.3 10*3/uL (ref 4.0–10.5)
nRBC: 0 % (ref 0.0–0.2)

## 2021-05-07 LAB — BASIC METABOLIC PANEL
Anion gap: 7 (ref 5–15)
BUN: 12 mg/dL (ref 8–23)
CO2: 33 mmol/L — ABNORMAL HIGH (ref 22–32)
Calcium: 8.5 mg/dL — ABNORMAL LOW (ref 8.9–10.3)
Chloride: 97 mmol/L — ABNORMAL LOW (ref 98–111)
Creatinine, Ser: 0.52 mg/dL (ref 0.44–1.00)
GFR, Estimated: >60 mL/min (ref >60)
Glucose, Bld: 90 mg/dL (ref 70–99)
Potassium: 3.5 mmol/L (ref 3.5–5.1)
Sodium: 137 mmol/L (ref 135–145)

## 2021-05-07 LAB — HEMOGLOBIN AND HEMATOCRIT, BLOOD
HCT: 28.6 % — ABNORMAL LOW (ref 36.0–46.0)
Hemoglobin: 8.8 g/dL — ABNORMAL LOW (ref 12.0–15.0)

## 2021-05-07 LAB — MAGNESIUM: Magnesium: 2 mg/dL (ref 1.7–2.4)

## 2021-05-07 LAB — GLUCOSE, CAPILLARY: Glucose-Capillary: 123 mg/dL — ABNORMAL HIGH (ref 70–99)

## 2021-05-07 MED ORDER — POTASSIUM CHLORIDE 2 MEQ/ML IV SOLN
INTRAVENOUS | Status: DC
  Filled 2021-05-07 (×3): qty 1000

## 2021-05-07 MED ORDER — LACTATED RINGERS IV BOLUS
1000.0000 mL | Freq: Once | INTRAVENOUS | Status: AC
Start: 2021-05-07 — End: 2021-05-07
  Administered 2021-05-07: 1000 mL via INTRAVENOUS

## 2021-05-07 MED ORDER — POTASSIUM CHLORIDE 10 MEQ/100ML IV SOLN
10.0000 meq | INTRAVENOUS | Status: AC
  Administered 2021-05-07 (×2): 10 meq via INTRAVENOUS
  Filled 2021-05-07 (×4): qty 100

## 2021-05-07 MED ORDER — LACTATED RINGERS IV SOLN: INTRAVENOUS | Status: DC

## 2021-05-07 MED ORDER — POTASSIUM CHLORIDE 10 MEQ/100ML IV SOLN: 10.0000 meq | INTRAVENOUS | Status: DC

## 2021-05-07 NOTE — Progress Notes (Signed)
[facility] night cross cover note:  Blood pressure soft in spite of interval initiation of continuous LR at 75 cc/h.  Result of previously ordered stat hemoglobin appears reassuring, noted to be 8.8 compared to 8.9 yesterday AM. Will order 1 L LR bolus at this time.      Babs Bertin, DO Hospitalist

## 2021-05-07 NOTE — Anesthesia Preprocedure Evaluation (Addendum)
Anesthesia Evaluation  Patient identified by MRN, date of birth, ID band Patient awake    Reviewed: Allergy & Precautions, NPO status , Patient's Chart, lab work & pertinent test results  Airway Mallampati: III  TM Distance: >3 FB Neck ROM: Full    Dental  (+) Partial Lower, Partial Upper,    Pulmonary pneumonia, unresolved, COPD,  COPD inhaler and oxygen dependent, former smoker,  Quit smoking 1994, 28 pack year history   Presented in acute on chronic (home O2 2LPM at night) hypoxic respiratory failure- COPD exacerbation 2/2 ?- question of aspiration PNA 2/2 dysphagia   Down to 3LPM O2 by [place], previously on BIPAP but has been weaned down   Pulmonary exam normal breath sounds clear to auscultation       Cardiovascular pulmonary hypertension (mod pHTN)+ DOE  Normal cardiovascular exam+ Valvular Problems/Murmurs (mild MR) MR  Rhythm:Regular Rate:Normal  Echo 05/05/21: 1. Left ventricular ejection fraction, by estimation, is 60 to 65%. The  left ventricle has normal function. The left ventricle has no regional  wall motion abnormalities. Left ventricular diastolic parameters are  indeterminate.  2. Right ventricular systolic function is normal. The right ventricular  size is normal. There is moderately elevated pulmonary artery systolic  pressure.  3. Left atrial size was mildly dilated.  4. Right atrial size was mildly dilated.  5. The mitral valve is abnormal. Mild mitral valve regurgitation. No  evidence of mitral stenosis. Moderate mitral annular calcification.  6. The aortic valve is tricuspid. There is moderate calcification of the  aortic valve. Aortic valve regurgitation is mild. Mild to moderate aortic  valve sclerosis/calcification is present, without any evidence of aortic  stenosis.  7. The inferior vena cava is dilated in size with >50% respiratory  variability, suggesting right atrial pressure of 8 mmHg.     Neuro/Psych  Headaches, PSYCHIATRIC DISORDERS Anxiety Depression    GI/Hepatic Neg liver ROS, GERD  Medicated and Controlled,Esophageal stricture  Admitted June 2022 for diverticular bleed s/p embolization- presented to the ER complaining of bloody stool, Hb 5.8   Endo/Other  negative endocrine ROS  Renal/GU negative Renal ROS  negative genitourinary   Musculoskeletal  (+) Arthritis , Osteoarthritis,    Abdominal   Peds  Hematology  (+) Blood dyscrasia, anemia , H/H 8.0/26.6 this AM    Anesthesia Other Findings   Reproductive/Obstetrics negative OB ROS                            Anesthesia Physical Anesthesia Plan  ASA: 4  Anesthesia Plan: MAC   Post-op Pain Management:    Induction:   PONV Risk Score and Plan: 2 and Propofol infusion and TIVA  Airway Management Planned: Natural Airway and Simple Face Mask  Additional Equipment: None  Intra-op Plan:   Post-operative Plan:   Informed Consent: I have reviewed the patients History and Physical, chart, labs and discussed the procedure including the risks, benefits and alternatives for the proposed anesthesia with the patient or authorized representative who has indicated his/her understanding and acceptance.     Dental advisory given  Plan Discussed with: CRNA  Anesthesia Plan Comments:        Anesthesia Quick Evaluation

## 2021-05-07 NOTE — Progress Notes (Signed)
Subjective: Patient states that she has had difficulty swallowing solids as well as liquids for a long time.  Both feel stuck around the sternal notch. She feels that she has lost 15 pounds over the last several weeks. As per her nurse there has not been any recent melena or hematochezia.  Objective: Vital signs in last 24 hours: Temp:  [97.5 F (36.4 C)-98.8 F (37.1 C)] 98 F (36.7 C) (11/07 0747) Pulse Rate:  [69-91] 75 (11/07 0800) Resp:  [13-22] 17 (11/07 0800) BP: (78-124)/(44-79) 104/55 (11/07 0800) SpO2:  [93 %-99 %] 99 % (11/07 0800) Weight change:  Last BM Date: 05/02/21  PE: Elderly, on oxygen via nasal cannula GENERAL: Mild pallor, no obvious icterus ABDOMEN: Soft, nondistended, nontender EXTREMITIES: No deformity  Lab Results: Results for orders placed or performed during the hospital encounter of 05/01/21 (from the past 48 hour(s))  CBC     Status: Abnormal   Collection Time: 05/06/21  2:27 AM  Result Value Ref Range   WBC 6.3 4.0 - 10.5 K/uL   RBC 3.47 (L) 3.87 - 5.11 MIL/uL   Hemoglobin 8.9 (L) 12.0 - 15.0 g/dL   HCT 28.2 (L) 36.0 - 46.0 %   MCV 81.3 80.0 - 100.0 fL   MCH 25.6 (L) 26.0 - 34.0 pg   MCHC 31.6 30.0 - 36.0 g/dL   RDW 20.1 (H) 11.5 - 15.5 %   Platelets 192 150 - 400 K/uL   nRBC 0.0 0.0 - 0.2 %    Comment: Performed at [facility], [address], [place], [place] [address]  Basic metabolic panel     Status: Abnormal   Collection Time: 05/06/21  2:27 AM  Result Value Ref Range   Sodium 135 135 - 145 mmol/L   Potassium 3.5 3.5 - 5.1 mmol/L   Chloride 92 (L) 98 - 111 mmol/L   CO2 36 (H) 22 - 32 mmol/L   Glucose, Bld 153 (H) 70 - 99 mg/dL    Comment: Glucose reference range applies only to samples taken after fasting for at least 8 hours.   BUN 10 8 - 23 mg/dL   Creatinine, Ser 0.55 0.44 - 1.00 mg/dL   Calcium 8.4 (L) 8.9 - 10.3 mg/dL   GFR, Estimated >60 >60 mL/min    Comment: (NOTE) Calculated using the CKD-EPI Creatinine Equation  (2021)    Anion gap 7 5 - 15    Comment: Performed at [facility] [address], [place], [place] [address]  Glucose, capillary     Status: Abnormal   Collection Time: 05/06/21  7:55 PM  Result Value Ref Range   Glucose-Capillary 111 (H) 70 - 99 mg/dL    Comment: Glucose reference range applies only to samples taken after fasting for at least 8 hours.  Hemoglobin and hematocrit, blood     Status: Abnormal   Collection Time: 05/07/21  2:30 AM  Result Value Ref Range   Hemoglobin 8.8 (L) 12.0 - 15.0 g/dL   HCT 28.6 (L) 36.0 - 46.0 %    Comment: Performed at [facility], [place] [address], [place], [place] [address]  Basic metabolic panel     Status: Abnormal   Collection Time: 05/07/21  4:45 AM  Result Value Ref Range   Sodium 137 135 - 145 mmol/L   Potassium 3.5 3.5 - 5.1 mmol/L   Chloride 97 (L) 98 - 111 mmol/L   CO2 33 (H) 22 - 32 mmol/L   Glucose,  Bld 90 70 - 99 mg/dL    Comment: Glucose reference range applies only to samples taken after fasting for at least 8 hours.   BUN 12 8 - 23 mg/dL   Creatinine, Ser 0.52 0.44 - 1.00 mg/dL   Calcium 8.5 (L) 8.9 - 10.3 mg/dL   GFR, Estimated >60 >60 mL/min    Comment: (NOTE) Calculated using the CKD-EPI Creatinine Equation (2021)    Anion gap 7 5 - 15    Comment: Performed at [facility] [address], [place], [place] [address]  CBC     Status: Abnormal   Collection Time: 05/07/21  4:45 AM  Result Value Ref Range   WBC 7.3 4.0 - 10.5 K/uL   RBC 3.18 (L) 3.87 - 5.11 MIL/uL   Hemoglobin 7.9 (L) 12.0 - 15.0 g/dL   HCT 26.6 (L) 36.0 - 46.0 %   MCV 83.6 80.0 - 100.0 fL   MCH 24.8 (L) 26.0 - 34.0 pg   MCHC 29.7 (L) 30.0 - 36.0 g/dL   RDW 20.8 (H) 11.5 - 15.5 %   Platelets 163 150 - 400 K/uL   nRBC 0.0 0.0 - 0.2 %    Comment: Performed at [facility] [address], [place], [place] [address]  Magnesium     Status: None   Collection Time: 05/07/21  4:45 AM  Result Value Ref Range   Magnesium 2.0 1.7 -  2.4 mg/dL    Comment: Performed at [facility] [address] [place], [place] [address]    Studies/Results: CT Angio Chest Pulmonary Embolism (PE) W or WO Contrast  Result Date: 05/05/2021 CLINICAL DATA:  Hypoxia on room air May 03, 2021. Tachycardia. Low O2 sats. Recent chest x-ray demonstrated bibasilar opacities suggesting possible pneumonia. EXAM: CT ANGIOGRAPHY CHEST WITH CONTRAST TECHNIQUE: Multidetector CT imaging of the chest was performed using the standard protocol during bolus administration of intravenous contrast. Multiplanar CT image reconstructions and MIPs were obtained to evaluate the vascular anatomy. CONTRAST:  42mL OMNIPAQUE IOHEXOL 350 MG/ML SOLN COMPARISON:  Chest x-ray May 04, 2021 FINDINGS: Cardiovascular: Calcified atherosclerosis in the left anterior descending coronary artery and circumflex artery. Mild cardiomegaly. Calcified atherosclerosis in the nonaneurysmal thoracic aorta. No dissection. No pulmonary emboli identified. Mediastinum/Nodes: Small pleural effusions. No pericardial effusion. The visualized esophagus is normal. Visualized thyroid is unremarkable. Mildly enlarged right mediastinal node on series 5, image 45. No other adenopathy. No chest wall abnormalities. Lungs/Pleura: Central airways are normal. No pneumothorax. Platelike opacity in the right middle lobe, likely atelectasis. Patchy nodularity in the adjacent right middle lobe, likely infectious or inflammatory. The largest nodular component measures 4 mm. Opacities associated with the bilateral pleural effusions are likely atelectasis. No other infiltrates. There is a nodule in the posterior left apex on axial image 6, coronal image 80, and sagittal image 98 measuring 11 by 13 by 8 mm in transverse, craniocaudal, and AP dimensions with a mean diameter of 11 mm. No other suspicious nodules or masses. Upper Abdomen: No changes since June of 2022. Musculoskeletal: Severe compression fracture at L1,  unchanged since June of 2022. Review of the MIP images confirms the above findings. IMPRESSION: 1. No pulmonary emboli. 2. 11 mm nodule in the left apex posteriorly. Consider one of the following in 3 months for both low-risk and high-risk individuals: (a) repeat chest CT, (b) follow-up PET-CT, or (c) tissue sampling. This recommendation follows the consensus statement: Guidelines for Management of Incidental Pulmonary Nodules Detected on  CT Images: From the Fleischner Society 2017; Radiology 2017; 284:228-243. 3. Patchy nodularity in the right middle lobe, likely infectious or inflammatory. Recommend attention on follow-up. 4. Calcified atherosclerosis in the left coronary arteries as above. Calcified atherosclerosis in the thoracic aorta. 5. Mild cardiomegaly. 6. Small pleural effusions with associated atelectasis. 7. A single mildly abnormal mediastinal node on series 5, image 45 measures 12 mm, nonspecific. This could be reactive. Recommend attention on follow-up. 8. Severe chronic compression fracture of L1. Aortic Atherosclerosis (ICD10-I70.0). Electronically Signed   By: Dorise Bullion III M.D.   On: 05/05/2021 17:52   ECHOCARDIOGRAM COMPLETE  Result Date: 05/05/2021    ECHOCARDIOGRAM REPORT   Patient Name:   TELA KOTECKI Date of Exam: 05/05/2021 Medical Rec #:  [id number]     Height:       64.0 in Accession #:    [id number]    Weight:       179.9 lb Date of Birth:  Apr 08, 1941     BSA:          1.870 m Patient Age:    24 years      BP:           119/72 mmHg Patient Gender: F             HR:           90 bpm. Exam Location:  Inpatient Procedure: 2D Echo, Cardiac Doppler and Color Doppler Indications:    Dsypnea  History:        Patient has prior history of Echocardiogram examinations, most                 recent 09/11/2017.  Sonographer:    Merrie Roof RDCS Referring Phys: Wallace  1. Left ventricular ejection fraction, by estimation, is 60 to 65%. The left ventricle has normal  function. The left ventricle has no regional wall motion abnormalities. Left ventricular diastolic parameters are indeterminate.  2. Right ventricular systolic function is normal. The right ventricular size is normal. There is moderately elevated pulmonary artery systolic pressure.  3. Left atrial size was mildly dilated.  4. Right atrial size was mildly dilated.  5. The mitral valve is abnormal. Mild mitral valve regurgitation. No evidence of mitral stenosis. Moderate mitral annular calcification.  6. The aortic valve is tricuspid. There is moderate calcification of the aortic valve. Aortic valve regurgitation is mild. Mild to moderate aortic valve sclerosis/calcification is present, without any evidence of aortic stenosis.  7. The inferior vena cava is dilated in size with >50% respiratory variability, suggesting right atrial pressure of 8 mmHg. FINDINGS  Left Ventricle: Left ventricular ejection fraction, by estimation, is 60 to 65%. The left ventricle has normal function. The left ventricle has no regional wall motion abnormalities. The left ventricular internal cavity size was normal in size. There is  no left ventricular hypertrophy. Left ventricular diastolic parameters are indeterminate. Right Ventricle: The right ventricular size is normal. No increase in right ventricular wall thickness. Right ventricular systolic function is normal. There is moderately elevated pulmonary artery systolic pressure. The tricuspid regurgitant velocity is 3.38 m/s, and with an assumed right atrial pressure of 8 mmHg, the estimated right ventricular systolic pressure is 78.6 mmHg. Left Atrium: Left atrial size was mildly dilated. Right Atrium: Right atrial size was mildly dilated. Pericardium: There is no evidence of pericardial effusion. Mitral Valve: The mitral valve is abnormal. There is moderate thickening of the mitral valve leaflet(s). There is  moderate calcification of the mitral valve leaflet(s). Moderate mitral annular  calcification. Mild mitral valve regurgitation. No evidence of mitral valve stenosis. Tricuspid Valve: The tricuspid valve is normal in structure. Tricuspid valve regurgitation is not demonstrated. No evidence of tricuspid stenosis. Aortic Valve: The aortic valve is tricuspid. There is moderate calcification of the aortic valve. Aortic valve regurgitation is mild. Mild to moderate aortic valve sclerosis/calcification is present, without any evidence of aortic stenosis. Aortic valve mean gradient measures 6.0 mmHg. Aortic valve peak gradient measures 11.4 mmHg. Aortic valve area, by VTI measures 2.20 cm. Pulmonic Valve: The pulmonic valve was normal in structure. Pulmonic valve regurgitation is not visualized. No evidence of pulmonic stenosis. Aorta: The aortic root is normal in size and structure. Venous: The inferior vena cava is dilated in size with greater than 50% respiratory variability, suggesting right atrial pressure of 8 mmHg. IAS/Shunts: The interatrial septum was not well visualized.  LEFT VENTRICLE PLAX 2D LVIDd:         4.30 cm   Diastology LVIDs:         2.80 cm   LV e' medial:    10.10 cm/s LV PW:         1.00 cm   LV E/e' medial:  15.6 LV IVS:        1.00 cm   LV e' lateral:   9.25 cm/s LVOT diam:     1.80 cm   LV E/e' lateral: 17.1 LV SV:         69 LV SV Index:   37 LVOT Area:     2.54 cm  RIGHT VENTRICLE          IVC RV Basal diam:  3.60 cm  IVC diam: 2.40 cm LEFT ATRIUM              Index        RIGHT ATRIUM           Index LA diam:        3.70 cm  1.98 cm/m   RA Area:     25.10 cm LA Vol (A2C):   100.0 ml 53.47 ml/m  RA Volume:   82.00 ml  43.84 ml/m LA Vol (A4C):   72.3 ml  38.66 ml/m LA Biplane Vol: 86.1 ml  46.04 ml/m  AORTIC VALVE AV Area (Vmax):    2.15 cm AV Area (Vmean):   2.14 cm AV Area (VTI):     2.20 cm AV Vmax:           169.00 cm/s AV Vmean:          117.000 cm/s AV VTI:            0.314 m AV Peak Grad:      11.4 mmHg AV Mean Grad:      6.0 mmHg LVOT Vmax:         143.00  cm/s LVOT Vmean:        98.500 cm/s LVOT VTI:          0.272 m LVOT/AV VTI ratio: 0.87  AORTA Ao Root diam: 2.90 cm Ao Asc diam:  2.80 cm MITRAL VALVE                TRICUSPID VALVE MV Area (PHT): 4.49 cm     TR Peak grad:   45.7 mmHg MV Decel Time: 169 msec     TR Vmax:        338.00 cm/s MV E velocity: 158.00 cm/s  MV A velocity: 151.00 cm/s  SHUNTS MV E/A ratio:  1.05         Systemic VTI:  0.27 m                             Systemic Diam: 1.80 cm Jenkins Rouge MD Electronically signed by Jenkins Rouge MD Signature Date/Time: 05/05/2021/3:15:29 PM    Final    VAS [place] LOWER EXTREMITY VENOUS (DVT)  Result Date: 05/05/2021  Lower Venous DVT Study Patient Name:  BERNIE FOBES  Date of Exam:   05/05/2021 Medical Rec #: [id number]      Accession #:    [id number] Date of Birth: 08-Jan-1941      Patient Gender: F Patient Age:   80 years Exam Location:  [facility] Procedure:      VAS [place] LOWER EXTREMITY VENOUS (DVT) Referring Phys: [facility] HARRIS --------------------------------------------------------------------------------  Indications: Swelling, and SOB.  Risk Factors: COPD. GI Bleed. Comparison Study: No prior study on file Performing Technologist: Sharion Dove RVS  Examination Guidelines: A complete evaluation includes B-mode imaging, spectral Doppler, color Doppler, and power Doppler as needed of all accessible portions of each vessel. Bilateral testing is considered an integral part of a complete examination. Limited examinations for reoccurring indications may be performed as noted. The reflux portion of the exam is performed with the patient in reverse Trendelenburg.  +---------+---------------+---------+-----------+----------+-------------------+ RIGHT    CompressibilityPhasicitySpontaneityPropertiesThrombus Aging      +---------+---------------+---------+-----------+----------+-------------------+ CFV      Full                                         pulsatile waveforms  +---------+---------------+---------+-----------+----------+-------------------+ SFJ      Full                                                             +---------+---------------+---------+-----------+----------+-------------------+ FV Prox  Full                                                             +---------+---------------+---------+-----------+----------+-------------------+ FV Mid   Full                                                             +---------+---------------+---------+-----------+----------+-------------------+ FV DistalFull                                                             +---------+---------------+---------+-----------+----------+-------------------+ PFV      Full                                                             +---------+---------------+---------+-----------+----------+-------------------+  POP      Full                                         pulsatile waveforms +---------+---------------+---------+-----------+----------+-------------------+ PTV      Full                                                             +---------+---------------+---------+-----------+----------+-------------------+ PERO     Full                                                             +---------+---------------+---------+-----------+----------+-------------------+   +---------+---------------+---------+-----------+----------+-------------------+ LEFT     CompressibilityPhasicitySpontaneityPropertiesThrombus Aging      +---------+---------------+---------+-----------+----------+-------------------+ CFV      Full                                         pulsatile waveforms +---------+---------------+---------+-----------+----------+-------------------+ SFJ      Full                                                             +---------+---------------+---------+-----------+----------+-------------------+ FV  Prox  Full                                                             +---------+---------------+---------+-----------+----------+-------------------+ FV Mid   Full                                                             +---------+---------------+---------+-----------+----------+-------------------+ FV DistalFull                                                             +---------+---------------+---------+-----------+----------+-------------------+ PFV      Full                                                             +---------+---------------+---------+-----------+----------+-------------------+ POP  pulsatile waveforms +---------+---------------+---------+-----------+----------+-------------------+ PTV      Full                                                             +---------+---------------+---------+-----------+----------+-------------------+ PERO     Full                                                             +---------+---------------+---------+-----------+----------+-------------------+     Summary: RIGHT: - There is no evidence of deep vein thrombosis in the lower extremity.  Pulsatile waveforms suggestive of fluid overload  LEFT: - There is no evidence of deep vein thrombosis in the lower extremity.  Pulsatile waveforms suggestive of fluid overload.  *See table(s) above for measurements and observations. Electronically signed by Monica Martinez MD on 05/05/2021 at 3:35:10 PM.    Final    VAS [place] UPPER EXTREMITY VENOUS DUPLEX  Result Date: 05/05/2021 UPPER VENOUS STUDY  Patient Name:  VIERA OKONSKI  Date of Exam:   05/05/2021 Medical Rec #: [id number]      Accession #:    [id number] Date of Birth: 08/26/1940      Patient Gender: F Patient Age:   40 years [facility] Procedure:      VAS [place] UPPER EXTREMITY VENOUS DUPLEX Referring Phys: Loree Fee HARRIS  --------------------------------------------------------------------------------  Indications: Edema Risk Factors: COPD, GI Bleed. Limitations: Poor ultrasound/tissue interface and edema. Comparison Study: No prior study on file Performing Technologist: Sharion Dove RVS  Examination Guidelines: A complete evaluation includes B-mode imaging, spectral Doppler, color Doppler, and power Doppler as needed of all accessible portions of each vessel. Bilateral testing is considered an integral part of a complete examination. Limited examinations for reoccurring indications may be performed as noted.  Right Findings: +----------+------------+---------+-----------+----------+-------------------+ RIGHT     CompressiblePhasicitySpontaneousProperties      Summary       +----------+------------+---------+-----------+----------+-------------------+ IJV           Full                                  pulsatile waveforms +----------+------------+---------+-----------+----------+-------------------+ Subclavian                                          pulsatile waveforms +----------+------------+---------+-----------+----------+-------------------+ Axillary                                            pulsatile waveforms +----------+------------+---------+-----------+----------+-------------------+ Brachial      Full                                  pulsatile waveforms +----------+------------+---------+-----------+----------+-------------------+ Radial        Full                                                      +----------+------------+---------+-----------+----------+-------------------+  Ulnar                                                 patent by color   +----------+------------+---------+-----------+----------+-------------------+ Cephalic    Partial                                  Age Indeterminate   +----------+------------+---------+-----------+----------+-------------------+ Basilic       Full                                                      +----------+------------+---------+-----------+----------+-------------------+  Left Findings: +----------+------------+---------+-----------+----------+-------------------+ LEFT      CompressiblePhasicitySpontaneousProperties      Summary       +----------+------------+---------+-----------+----------+-------------------+ Subclavian                                          pulsatile waveforms +----------+------------+---------+-----------+----------+-------------------+  Summary:  Right: No evidence of deep vein thrombosis in the upper extremity. Findings consistent with non occlusive, age indeterminate superficial vein thrombosis involving a small segment of the right cephalic vein at the distal upper arm. Waveforms are pulsatile suggestive of fluid overload.  Left: No evidence of thrombosis in the subclavian.  *See table(s) above for measurements and observations.  Diagnosing physician: Monica Martinez MD Electronically signed by Monica Martinez MD on 05/05/2021 at 3:35:36 PM.    Final    DG ESOPHAGUS W SINGLE CM (SOL OR THIN BA)  Result Date: 05/07/2021 CLINICAL DATA:  Difficulty swallowing. EXAM: ESOPHOGRAM/BARIUM SWALLOW TECHNIQUE: Single contrast examination was performed using  thin barium. FLUOROSCOPY TIME:  Fluoroscopy Time:  1 minute 30 seconds Radiation Exposure Index (if provided by the fluoroscopic device): Number of Acquired Spot Images: None. COMPARISON:  CT chest 05/05/2021. FINDINGS: A limited study was done due to patient condition. Poor esophageal motility with tertiary contractions. Esophageal fold thickening. There is narrowing at the gastroesophageal junction, through which a 13 mm barium tablet would not pass. IMPRESSION: 1. Stricture at the gastroesophageal junction, through which a 13 mm barium pill would not pass.  2. Esophageal dysmotility. 3. Esophageal fold thickening. Electronically Signed   By: Lorin Picket M.D.   On: 05/07/2021 09:53    Medications: I have reviewed the patient's current medications.  Assessment: Dysphagia-barium swallow which showed stricture at GE junction through which a 13 mm barium pill would not pass, esophageal dysmotility and esophageal fold thickening  Recent diverticular bleed, hemoglobin 7.9, no recent melena or hematochezia, normal BUN  Multiple comorbidities-acute on chronic hypoxic and hypercapnic respiratory failure, off BiPAP  Plan: Diagnostic EGD in a.m. tomorrow with anesthesia with plans for balloon dilation. Clear liquid diet today.  Ronnette Juniper, MD 05/07/2021, 10:26 AM

## 2021-05-07 NOTE — Evaluation (Signed)
Occupational Therapy Evaluation Patient Details Name: [name] MRN: [id number] DOB: Nov 07, 1940 Today's Date: 05/07/2021   History of Present Illness 80 y.o. female admitted 05/01/2021 with anemia and GIB. 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 Admitted for above and presenting with problem list below, including generalized weakness, impaired balance, decreased activity tolerance, impaired vision, and impaired cognition.  Patient currently requires min guard for transfers and limited mobility in room using rollator, up to mod assist for ADLs.  She is oriented and follows commands with increased time, presents with decreased awareness, safety and recall. She will benefit continue OT services while admitted and after dc at [facility] level given 24/7 support at dc.  Will follow acutely.      Recommendations for follow up therapy are one component of a multi-disciplinary discharge planning process, led by the attending physician.  Recommendations may be updated based on patient status, additional functional criteria and insurance authorization.   Follow Up Recommendations  Home health OT    Assistance Recommended at Discharge Frequent or constant Supervision/Assistance  Functional Status Assessment  Patient has had a recent decline in their functional status and demonstrates the ability to make significant improvements in function in a reasonable and predictable amount of time.  Equipment Recommendations  None recommended by OT    Recommendations for Other Services       Precautions / Restrictions Precautions Precautions: Other (comment);Fall Precaution Comments: poor vision Restrictions Weight Bearing Restrictions: No      Mobility Bed Mobility Overal bed mobility: Needs Assistance Bed Mobility: Supine to Sit     Supine to sit: Min assist     General bed  mobility comments: OOB in recliner upon entry    Transfers Overall transfer level: Needs assistance Equipment used: Rollator (4 wheels) Transfers: Sit to/from Stand Sit to Stand: Min guard           General transfer comment: cueing for hand placement, technique and safety      Balance Overall balance assessment: Needs assistance Sitting-balance support: No upper extremity supported;Feet supported Sitting balance-Leahy Scale: Fair Sitting balance - Comments: EOb without UE support   Standing balance support: Bilateral upper extremity supported;During functional activity Standing balance-Leahy Scale: Poor Standing balance comment: reliant on rollator dynamically, but able to stand statically with min guard                           ADL either performed or assessed with clinical judgement   ADL Overall ADL's : Needs assistance/impaired     Grooming: Set up;Sitting   Upper Body Bathing: Set up;Sitting   Lower Body Bathing: Minimal assistance;Sit to/from stand   Upper Body Dressing : Set up;Sitting   Lower Body Dressing: Moderate assistance;Sit to/from stand   Toilet Transfer: Min guard;Cueing for safety;Rollator (4 wheels);Ambulation   Toileting- Clothing Manipulation and Hygiene: Moderate assistance;Sit to/from stand Toileting - Clothing Manipulation Details (indicate cue type and reason): requires assist for hygiene     Functional mobility during ADLs: Minimal assistance;Rollator (4 wheels);Cueing for safety       Vision Baseline Vision/History: 1 Wears glasses Ability to See in Adequate Light: 1 Impaired Patient Visual Report: No change from baseline Additional Comments: reports "i'm loosing my vision", voices some blurriness but doesn't see well     Perception     Praxis  Pertinent Vitals/Pain Pain Assessment: No/denies pain     Hand Dominance Right   Extremity/Trunk Assessment Upper Extremity Assessment Upper Extremity Assessment:  Generalized weakness   Lower Extremity Assessment Lower Extremity Assessment: Defer to PT evaluation   Cervical / Trunk Assessment Cervical / Trunk Assessment: Kyphotic   Communication Communication Communication: No difficulties   Cognition Arousal/Alertness: Awake/alert Behavior During Therapy: WFL for tasks assessed/performed Overall Cognitive Status: Impaired/Different from baseline Area of Impairment: Awareness;Problem solving                     Memory: Decreased short-term memory     Awareness: Emergent Problem Solving: Slow processing;Requires verbal cues       General Comments  VSS on 1.5L    Exercises General Exercises - Lower Extremity Long Arc Quad: AROM;Both;Seated;15 reps Hip Flexion/Marching: AROM;Both;Seated;15 reps   Shoulder Instructions      Home Living Family/patient expects to be discharged to:: Private residence Living Arrangements: Spouse/significant other Available Help at Discharge: Family;Available 24 hours/day;Personal care attendant Type of Home: House Home Access: Stairs to enter [employer] of Steps: 3 Entrance Stairs-Rails: Left Home Layout: One level     Bathroom Shower/Tub: Occupational psychologist: Standard (BSC over toilet)     Home Equipment: Conservation officer, nature (2 wheels);Rollator (4 wheels);Cane - single point;Shower seat - built in;BSC/3in1          Prior Functioning/Environment Prior Level of Function : Needs assist       Physical Assist : ADLs (physical)   ADLs (physical): Bathing;Dressing;IADLs Mobility Comments: pt ambulates with use of RW indoors and rollator in the community ADLs Comments: reports has a "visiting angel" 4x/week to assist with bathing, some dressing; spouse assists on other days as needed.  spouse assist with IADLs.        OT Problem List: Decreased strength;Impaired balance (sitting and/or standing);Decreased activity tolerance;Decreased knowledge of  precautions;Decreased knowledge of use of DME or AE;Decreased cognition;Decreased safety awareness      OT Treatment/Interventions: Self-care/ADL training;Therapeutic exercise;DME and/or AE instruction;Energy conservation;Therapeutic activities;Cognitive remediation/compensation;Patient/family education;Balance training    OT Goals(Current goals can be found in the care plan section) Acute Rehab OT Goals Patient Stated Goal: to get better OT Goal Formulation: With patient Time For Goal Achievement: 05/21/21 Potential to Achieve Goals: Good  OT Frequency: Min 2X/week   Barriers to D/C:            Co-evaluation              AM-PAC OT "6 Clicks" Daily Activity     Outcome Measure Help from another person eating meals?: A Little Help from another person taking care of personal grooming?: A Little Help from another person toileting, which includes using toliet, bedpan, or urinal?: A Lot Help from another person bathing (including washing, rinsing, drying)?: A Little Help from another person to put on and taking off regular upper body clothing?: A Little Help from another person to put on and taking off regular lower body clothing?: A Lot 6 Click Score: 16   End of Session Equipment Utilized During Treatment: Gait belt;Rolling walker (2 wheels) Nurse Communication: Mobility status  Activity Tolerance: Patient tolerated treatment well Patient left: in chair;with call bell/phone within reach;with chair alarm set  OT Visit Diagnosis: Other abnormalities of gait and mobility (R26.89);Muscle weakness (generalized) (M62.81)                Time: 1130-1156 OT Time Calculation (min): 26 min Charges:  OT General  Charges $OT Visit: 1 Visit OT Evaluation $OT Eval Moderate Complexity: 1 Mod OT Treatments $Self Care/Home Management : 8-22 mins  Jolaine Artist, OT Acute Rehabilitation Services Pager [phone] Office [phone]   [name] 05/07/2021, 1:45 PM

## 2021-05-07 NOTE — Progress Notes (Signed)
Physical Therapy Treatment Patient Details Name: [name] MRN: [id number] DOB: [birth year] Today's Date: 05/07/2021   History of Present Illness 80 y.o. female admitted 05/01/2021 with anemia and GIB. PMH includes COPD on home O2 at night, HTN    PT Comments    Pt pleasant and stating she has no desire to go to SNF and that spouse intends to hire additional assist. Pt currently has hired assist 4hrs/day and states spouse can assist at all time (he was not present to confirm). Pt with improved activity from prior session with assist needed for bed mobility and guarding with gait. Pt states she will use all necessary assist to get up stairs at home and continue to get stronger. Pt educated for increased mobility acutely to return to PLOF as well as HEP. Will continue to follow.   Pt on 2L at night at home. Attempted RA at rest with desaturation to 87% with 1L able to achieve 93%    Recommendations for follow up therapy are one component of a multi-disciplinary discharge planning process, led by the attending physician.  Recommendations may be updated based on patient status, additional functional criteria and insurance authorization.  Follow Up Recommendations  Home health PT     Assistance Recommended at Discharge Frequent or constant Supervision/Assistance  Equipment Recommendations  BSC/3in1;Wheelchair (measurements PT)    Recommendations for Other Services       Precautions / Restrictions Precautions Precautions: Other (comment);Fall Precaution Comments: poor vision Restrictions Weight Bearing Restrictions: No     Mobility  Bed Mobility Overal bed mobility: Needs Assistance Bed Mobility: Supine to Sit     Supine to sit: Min assist     General bed mobility comments: HOB flat with min assist to fully rise to sitting and min assist to scoot to EOB    Transfers Overall transfer level: Needs assistance   Transfers: Sit to/from Stand Sit to Stand: Min guard            General transfer comment: cues for brakes and hand placement with rollator present    Ambulation/Gait Ambulation/Gait assistance: Min guard Gait Distance (Feet): 120 Feet Assistive device: Rollator (4 wheels) Gait Pattern/deviations: Step-through pattern;Decreased stride length;Decreased step length - left;Trunk flexed   Gait velocity interpretation: <1.8 ft/sec, indicate of risk for recurrent falls   General Gait Details: cues for proximity to RW and pt able to self direct distance, guarding for safety and lines   Stairs             Wheelchair Mobility    Modified Rankin (Stroke Patients Only)       Balance Overall balance assessment: Needs assistance Sitting-balance support: No upper extremity supported;Feet supported Sitting balance-Leahy Scale: Fair Sitting balance - Comments: EOb without UE support   Standing balance support: Bilateral upper extremity supported Standing balance-Leahy Scale: Poor Standing balance comment: reliant on rollator                            Cognition Arousal/Alertness: Awake/alert Behavior During Therapy: WFL for tasks assessed/performed Overall Cognitive Status: Impaired/Different from baseline Area of Impairment: Problem solving                             Problem Solving: Slow processing          Exercises General Exercises - Lower Extremity Long Arc Quad: AROM;Both;Seated;15 reps Hip Flexion/Marching: AROM;Both;Seated;15 reps    General  Comments        Pertinent Vitals/Pain Pain Assessment: No/denies pain    Home Living                          Prior Function            PT Goals (current goals can now be found in the care plan section) Progress towards PT goals: Progressing toward goals    Frequency    Min 3X/week      PT Plan Current plan remains appropriate    Co-evaluation              AM-PAC PT "6 Clicks" Mobility   Outcome Measure  Help  needed turning from your back to your side while in a flat bed without using bedrails?: None Help needed moving from lying on your back to sitting on the side of a flat bed without using bedrails?: A Little Help needed moving to and from a bed to a chair (including a wheelchair)?: A Little Help needed standing up from a chair using your arms (e.g., wheelchair or bedside chair)?: A Little Help needed to walk in hospital room?: A Little Help needed climbing 3-5 steps with a railing? : A Lot 6 Click Score: 18    End of Session Equipment Utilized During Treatment: Gait belt;Oxygen Activity Tolerance: Patient tolerated treatment well Patient left: in chair;with call bell/phone within reach Nurse Communication: Mobility status PT Visit Diagnosis: Unsteadiness on feet (R26.81);Other abnormalities of gait and mobility (R26.89);Muscle weakness (generalized) (M62.81)     Time: 3254-9826 PT Time Calculation (min) (ACUTE ONLY): 29 min  Charges:  $Gait Training: 8-22 mins $Therapeutic Exercise: 8-22 mins                     [place], PT Acute Rehabilitation Services Pager: [phone] Office: [place] 05/07/2021, 11:10 AM

## 2021-05-07 NOTE — Progress Notes (Addendum)
Speech Language Pathology Treatment: Dysphagia  Patient Details Name: [name] MRN: [id number] DOB: 1940-08-03 Today's Date: 05/07/2021 Time: 1121-1129 SLP Time Calculation (min) (ACUTE ONLY): 8 min  Assessment / Plan / Recommendation Clinical Impression  Pt was seen for dysphagia treatment and she was cooperative throughout the session. Sarah, RN denied observance of any signs of aspiration with pills and pt denied any symptoms of oropharyngeal dysphagia. Pt stated that she still has the sensation of pills being "stuck" from taking them earlier. Pt is currently on a clear liquid diet with plan for esophageal dilation on 11/8, so trials were restricted to liquids. She individual and consecutive boluses of thin liquids via straw without symptoms of oropharyngeal dysphagia. Pt's diet may be advanced per GI's recommendation. Further skilled SLP services are not clinically indicated at this time.    HPI HPI: Pt is an 80 y.o. female presents to [facility] hospital on [facility]/01/2021 with low hemoglobin and ongoing rectal bleeding. Esophagram 11/7: Stricture at the gastroesophageal junction, through which a 13 mm barium pill would not pass, esophageal dysmotility, esophageal fold thickening. GI following pt and plan for EGD on 11/8 with balloon dilation. PMH includes COPD on home oxygen, hypertension. 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      SLP Plan  All goals met;Discharge SLP treatment due to (comment)      Recommendations for follow up therapy are one component of a multi-disciplinary discharge planning process, led by the attending physician.  Recommendations may be updated based on patient status, additional functional criteria and insurance authorization.    Recommendations  Diet recommendations: Thin liquid (Continue clear liquids; adavncement as clinically indicated per GI's recommendation) Liquids provided via: Cup;Straw Medication Administration: Crushed with  puree/whole with liquids as tolerated Supervision: Patient able to self feed Compensations: Slow rate;Small sips/bites Postural Changes and/or Swallow Maneuvers: Seated upright 90 degrees                Oral Care Recommendations: Oral care BID Follow up Recommendations: None SLP Visit Diagnosis: Dysphagia, unspecified (R13.10) Plan: All goals met;Discharge SLP treatment due to (comment)       [name] I. Hardin Negus, [name], [place] Office number [phone] Pager [place]  05/07/2021, 12:40 PM

## 2021-05-07 NOTE — Progress Notes (Signed)
[facility] night cross cover note:  I will notified by RN that morning serum potassium level is 3.5.  This patient is currently n.p.o.. I  placed order for potassium chloride 40 mEq IV over 4 hours x 1 dose now.      Babs Bertin, DO Hospitalist

## 2021-05-07 NOTE — Progress Notes (Signed)
[facility] night cross cover note:  I was notified by RN of patient's most recent blood pressure of 95/52.  Currently asymptomatic, with heart rates in the 70s to 80s.   Per my chart review, including review of most recent [facility] progress note, this is a 80 year old female admitted 5 days ago for acute blood loss anemia in the setting of suspected acute lower gastrointestinal bleed, felt to be diverticular in nature following GI consultation and evaluation, requiring total of 2 units PRBC transfusion following initial hemoglobin of 5.6.   It appears that the patient had systolic blood pressures in the 80s around 4 AM on 05/06/2021, which reportedly improved spontaneously.  Subsequently, systolic blood pressures appear to have been in the 90s to 110s mmHg, including blood pressure 94/60 at 2100, 85/49 at 2300, with spontaneous improvement to the blood pressure 95/52 with which I was notified.  No reported interval evidence of additional GI bleed.  It is noted that the patient is currently n.p.o., and I confirmed with RN that she has not recently been on IV fluids, raising the possibility of some mild dehydration contributing to the above blood pressure. I started continuous LR at 75 cc/h x 10 hours, ordered stat H&H, and asked to be notified if blood pressure begins to trend back down.      Babs Bertin, DO Hospitalist

## 2021-05-07 NOTE — Progress Notes (Signed)
[facility] night cross cover note:  RN contacted me to ask if we wanted to have any morning labs on this patient, as she reports that none are currently ordered.  I provided verbal order requesting that the following morning labs be checked today: BMP, serum magnesium level, and CBC without differential.      Babs Bertin, DO Hospitalist

## 2021-05-07 NOTE — H&P (View-Only) (Signed)
Subjective: Patient states that she has had difficulty swallowing solids as well as liquids for a long time.  Both feel stuck around the sternal notch. She feels that she has lost 15 pounds over the last several weeks. As per her nurse there has not been any recent melena or hematochezia.  Objective: Vital signs in last 24 hours: Temp:  [97.5 F (36.4 C)-98.8 F (37.1 C)] 98 F (36.7 C) (11/07 0747) Pulse Rate:  [69-91] 75 (11/07 0800) Resp:  [13-22] 17 (11/07 0800) BP: (78-124)/(44-79) 104/55 (11/07 0800) SpO2:  [93 %-99 %] 99 % (11/07 0800) Weight change:  Last BM Date: 05/02/21  PE: Elderly, on oxygen via nasal cannula GENERAL: Mild pallor, no obvious icterus ABDOMEN: Soft, nondistended, nontender EXTREMITIES: No deformity  Lab Results: Results for orders placed or performed during the hospital encounter of 05/01/21 (from the past 48 hour(s))  CBC     Status: Abnormal   Collection Time: 05/06/21  2:27 AM  Result Value Ref Range   WBC 6.3 4.0 - 10.5 K/uL   RBC 3.47 (L) 3.87 - 5.11 MIL/uL   Hemoglobin 8.9 (L) 12.0 - 15.0 g/dL   HCT 28.2 (L) 36.0 - 46.0 %   MCV 81.3 80.0 - 100.0 fL   MCH 25.6 (L) 26.0 - 34.0 pg   MCHC 31.6 30.0 - 36.0 g/dL   RDW 20.1 (H) 11.5 - 15.5 %   Platelets 192 150 - 400 K/uL   nRBC 0.0 0.0 - 0.2 %    Comment: Performed at [facility], [address] [place], [place] [address]  Basic metabolic panel     Status: Abnormal   Collection Time: 05/06/21  2:27 AM  Result Value Ref Range   Sodium 135 135 - 145 mmol/L   Potassium 3.5 3.5 - 5.1 mmol/L   Chloride 92 (L) 98 - 111 mmol/L   CO2 36 (H) 22 - 32 mmol/L   Glucose, Bld 153 (H) 70 - 99 mg/dL    Comment: Glucose reference range applies only to samples taken after fasting for at least 8 hours.   BUN 10 8 - 23 mg/dL   Creatinine, Ser 0.55 0.44 - 1.00 mg/dL   Calcium 8.4 (L) 8.9 - 10.3 mg/dL   GFR, Estimated >60 >60 mL/min    Comment: (NOTE) Calculated using the CKD-EPI Creatinine Equation  (2021)    Anion gap 7 5 - 15    Comment: Performed at [facility] [address], [place], [place] [address]  Glucose, capillary     Status: Abnormal   Collection Time: 05/06/21  7:55 PM  Result Value Ref Range   Glucose-Capillary 111 (H) 70 - 99 mg/dL    Comment: Glucose reference range applies only to samples taken after fasting for at least 8 hours.  Hemoglobin and hematocrit, blood     Status: Abnormal   Collection Time: 05/07/21  2:30 AM  Result Value Ref Range   Hemoglobin 8.8 (L) 12.0 - 15.0 g/dL   HCT 28.6 (L) 36.0 - 46.0 %    Comment: Performed at [facility], [place] [address], [place], [place] [address]  Basic metabolic panel     Status: Abnormal   Collection Time: 05/07/21  4:45 AM  Result Value Ref Range   Sodium 137 135 - 145 mmol/L   Potassium 3.5 3.5 - 5.1 mmol/L   Chloride 97 (L) 98 - 111 mmol/L   CO2 33 (H) 22 - 32 mmol/L   Glucose,  Bld 90 70 - 99 mg/dL    Comment: Glucose reference range applies only to samples taken after fasting for at least 8 hours.   BUN 12 8 - 23 mg/dL   Creatinine, Ser 0.52 0.44 - 1.00 mg/dL   Calcium 8.5 (L) 8.9 - 10.3 mg/dL   GFR, Estimated >60 >60 mL/min    Comment: (NOTE) Calculated using the CKD-EPI Creatinine Equation (2021)    Anion gap 7 5 - 15    Comment: Performed at [facility] [address], [place], [place] [address]  CBC     Status: Abnormal   Collection Time: 05/07/21  4:45 AM  Result Value Ref Range   WBC 7.3 4.0 - 10.5 K/uL   RBC 3.18 (L) 3.87 - 5.11 MIL/uL   Hemoglobin 7.9 (L) 12.0 - 15.0 g/dL   HCT 26.6 (L) 36.0 - 46.0 %   MCV 83.6 80.0 - 100.0 fL   MCH 24.8 (L) 26.0 - 34.0 pg   MCHC 29.7 (L) 30.0 - 36.0 g/dL   RDW 20.8 (H) 11.5 - 15.5 %   Platelets 163 150 - 400 K/uL   nRBC 0.0 0.0 - 0.2 %    Comment: Performed at [facility] [address], [place], [place] [address]  Magnesium     Status: None   Collection Time: 05/07/21  4:45 AM  Result Value Ref Range   Magnesium 2.0 1.7 -  2.4 mg/dL    Comment: Performed at [facility] [address] [place],  [address]    Studies/Results: CT Angio Chest Pulmonary Embolism (PE) W or WO Contrast  Result Date: 05/05/2021 CLINICAL DATA:  Hypoxia on room air May 03, 2021. Tachycardia. Low O2 sats. Recent chest x-ray demonstrated bibasilar opacities suggesting possible pneumonia. EXAM: CT ANGIOGRAPHY CHEST WITH CONTRAST TECHNIQUE: Multidetector CT imaging of the chest was performed using the standard protocol during bolus administration of intravenous contrast. Multiplanar CT image reconstructions and MIPs were obtained to evaluate the vascular anatomy. CONTRAST:  78mL OMNIPAQUE IOHEXOL 350 MG/ML SOLN COMPARISON:  Chest x-ray May 04, 2021 FINDINGS: Cardiovascular: Calcified atherosclerosis in the left anterior descending coronary artery and circumflex artery. Mild cardiomegaly. Calcified atherosclerosis in the nonaneurysmal thoracic aorta. No dissection. No pulmonary emboli identified. Mediastinum/Nodes: Small pleural effusions. No pericardial effusion. The visualized esophagus is normal. Visualized thyroid is unremarkable. Mildly enlarged right mediastinal node on series 5, image 45. No other adenopathy. No chest wall abnormalities. Lungs/Pleura: Central airways are normal. No pneumothorax. Platelike opacity in the right middle lobe, likely atelectasis. Patchy nodularity in the adjacent right middle lobe, likely infectious or inflammatory. The largest nodular component measures 4 mm. Opacities associated with the bilateral pleural effusions are likely atelectasis. No other infiltrates. There is a nodule in the posterior left apex on axial image 6, coronal image 80, and sagittal image 98 measuring 11 by 13 by 8 mm in transverse, craniocaudal, and AP dimensions with a mean diameter of 11 mm. No other suspicious nodules or masses. Upper Abdomen: No changes since June of 2022. Musculoskeletal: Severe compression fracture at L1,  unchanged since June of 2022. Review of the MIP images confirms the above findings. IMPRESSION: 1. No pulmonary emboli. 2. 11 mm nodule in the left apex posteriorly. Consider one of the following in 3 months for both low-risk and high-risk individuals: (a) repeat chest CT, (b) follow-up PET-CT, or (c) tissue sampling. This recommendation follows the consensus statement: Guidelines for Management of Incidental Pulmonary Nodules Detected on  CT Images: From the Fleischner Society 2017; Radiology 2017; 284:228-243. 3. Patchy nodularity in the right middle lobe, likely infectious or inflammatory. Recommend attention on follow-up. 4. Calcified atherosclerosis in the left coronary arteries as above. Calcified atherosclerosis in the thoracic aorta. 5. Mild cardiomegaly. 6. Small pleural effusions with associated atelectasis. 7. A single mildly abnormal mediastinal node on series 5, image 45 measures 12 mm, nonspecific. This could be reactive. Recommend attention on follow-up. 8. Severe chronic compression fracture of L1. Aortic Atherosclerosis (ICD10-I70.0). Electronically Signed   By: Dorise Bullion III M.D.   On: 05/05/2021 17:52   ECHOCARDIOGRAM COMPLETE  Result Date: 05/05/2021    ECHOCARDIOGRAM REPORT   Patient Name:   ZETA BUCY Date of Exam: 05/05/2021 Medical Rec #:  [id number]     Height:       64.0 in Accession #:    [id number]    Weight:       179.9 lb Date of Birth:  1940-11-05     BSA:          1.870 m Patient Age:    39 years      BP:           119/72 mmHg Patient Gender: F             HR:           90 bpm. Exam Location:  Inpatient Procedure: 2D Echo, Cardiac Doppler and Color Doppler Indications:    Dsypnea  History:        Patient has prior history of Echocardiogram examinations, most                 recent 09/11/2017.  Sonographer:    Merrie Roof RDCS Referring Phys: [place]  1. Left ventricular ejection fraction, by estimation, is 60 to 65%. The left ventricle has normal  function. The left ventricle has no regional wall motion abnormalities. Left ventricular diastolic parameters are indeterminate.  2. Right ventricular systolic function is normal. The right ventricular size is normal. There is moderately elevated pulmonary artery systolic pressure.  3. Left atrial size was mildly dilated.  4. Right atrial size was mildly dilated.  5. The mitral valve is abnormal. Mild mitral valve regurgitation. No evidence of mitral stenosis. Moderate mitral annular calcification.  6. The aortic valve is tricuspid. There is moderate calcification of the aortic valve. Aortic valve regurgitation is mild. Mild to moderate aortic valve sclerosis/calcification is present, without any evidence of aortic stenosis.  7. The inferior vena cava is dilated in size with >50% respiratory variability, suggesting right atrial pressure of 8 mmHg. FINDINGS  Left Ventricle: Left ventricular ejection fraction, by estimation, is 60 to 65%. The left ventricle has normal function. The left ventricle has no regional wall motion abnormalities. The left ventricular internal cavity size was normal in size. There is  no left ventricular hypertrophy. Left ventricular diastolic parameters are indeterminate. Right Ventricle: The right ventricular size is normal. No increase in right ventricular wall thickness. Right ventricular systolic function is normal. There is moderately elevated pulmonary artery systolic pressure. The tricuspid regurgitant velocity is 3.38 m/s, and with an assumed right atrial pressure of 8 mmHg, the estimated right ventricular systolic pressure is 89.1 mmHg. Left Atrium: Left atrial size was mildly dilated. Right Atrium: Right atrial size was mildly dilated. Pericardium: There is no evidence of pericardial effusion. Mitral Valve: The mitral valve is abnormal. There is moderate thickening of the mitral valve leaflet(s). There is  moderate calcification of the mitral valve leaflet(s). Moderate mitral annular  calcification. Mild mitral valve regurgitation. No evidence of mitral valve stenosis. Tricuspid Valve: The tricuspid valve is normal in structure. Tricuspid valve regurgitation is not demonstrated. No evidence of tricuspid stenosis. Aortic Valve: The aortic valve is tricuspid. There is moderate calcification of the aortic valve. Aortic valve regurgitation is mild. Mild to moderate aortic valve sclerosis/calcification is present, without any evidence of aortic stenosis. Aortic valve mean gradient measures 6.0 mmHg. Aortic valve peak gradient measures 11.4 mmHg. Aortic valve area, by VTI measures 2.20 cm. Pulmonic Valve: The pulmonic valve was normal in structure. Pulmonic valve regurgitation is not visualized. No evidence of pulmonic stenosis. Aorta: The aortic root is normal in size and structure. Venous: The inferior vena cava is dilated in size with greater than 50% respiratory variability, suggesting right atrial pressure of 8 mmHg. IAS/Shunts: The interatrial septum was not well visualized.  LEFT VENTRICLE PLAX 2D LVIDd:         4.30 cm   Diastology LVIDs:         2.80 cm   LV e' medial:    10.10 cm/s LV PW:         1.00 cm   LV E/e' medial:  15.6 LV IVS:        1.00 cm   LV e' lateral:   9.25 cm/s LVOT diam:     1.80 cm   LV E/e' lateral: 17.1 LV SV:         69 LV SV Index:   37 LVOT Area:     2.54 cm  RIGHT VENTRICLE          IVC RV Basal diam:  3.60 cm  IVC diam: 2.40 cm LEFT ATRIUM              Index        RIGHT ATRIUM           Index LA diam:        3.70 cm  1.98 cm/m   RA Area:     25.10 cm LA Vol (A2C):   100.0 ml 53.47 ml/m  RA Volume:   82.00 ml  43.84 ml/m LA Vol (A4C):   72.3 ml  38.66 ml/m LA Biplane Vol: 86.1 ml  46.04 ml/m  AORTIC VALVE AV Area (Vmax):    2.15 cm AV Area (Vmean):   2.14 cm AV Area (VTI):     2.20 cm AV Vmax:           169.00 cm/s AV Vmean:          117.000 cm/s AV VTI:            0.314 m AV Peak Grad:      11.4 mmHg AV Mean Grad:      6.0 mmHg LVOT Vmax:         143.00  cm/s LVOT Vmean:        98.500 cm/s LVOT VTI:          0.272 m LVOT/AV VTI ratio: 0.87  AORTA Ao Root diam: 2.90 cm Ao Asc diam:  2.80 cm MITRAL VALVE                TRICUSPID VALVE MV Area (PHT): 4.49 cm     TR Peak grad:   45.7 mmHg MV Decel Time: 169 msec     TR Vmax:        338.00 cm/s MV E velocity: 158.00 cm/s  MV A velocity: 151.00 cm/s  SHUNTS MV E/A ratio:  1.05         Systemic VTI:  0.27 m                             Systemic Diam: 1.80 cm Jenkins Rouge MD Electronically signed by Jenkins Rouge MD Signature Date/Time: 05/05/2021/3:15:29 PM    Final    VAS [place] LOWER EXTREMITY VENOUS (DVT)  Result Date: 05/05/2021  Lower Venous DVT Study Patient Name:  KINZLEE SELVY  Date of Exam:   05/05/2021 Medical Rec #: [id number]      Accession #:    [id number] Date of Birth: [birth year]      Patient Gender: F Patient Age:   80 years Exam Location:  [facility] Procedure:      VAS [place] LOWER EXTREMITY VENOUS (DVT) Referring Phys: [facility] HARRIS --------------------------------------------------------------------------------  Indications: Swelling, and SOB.  Risk Factors: COPD. GI Bleed. Comparison Study: No prior study on file Performing Technologist: Sharion Dove RVS  Examination Guidelines: A complete evaluation includes B-mode imaging, spectral Doppler, color Doppler, and power Doppler as needed of all accessible portions of each vessel. Bilateral testing is considered an integral part of a complete examination. Limited examinations for reoccurring indications may be performed as noted. The reflux portion of the exam is performed with the patient in reverse Trendelenburg.  +---------+---------------+---------+-----------+----------+-------------------+ RIGHT    CompressibilityPhasicitySpontaneityPropertiesThrombus Aging      +---------+---------------+---------+-----------+----------+-------------------+ CFV      Full                                         pulsatile waveforms  +---------+---------------+---------+-----------+----------+-------------------+ SFJ      Full                                                             +---------+---------------+---------+-----------+----------+-------------------+ FV Prox  Full                                                             +---------+---------------+---------+-----------+----------+-------------------+ FV Mid   Full                                                             +---------+---------------+---------+-----------+----------+-------------------+ FV DistalFull                                                             +---------+---------------+---------+-----------+----------+-------------------+ PFV      Full                                                             +---------+---------------+---------+-----------+----------+-------------------+  POP      Full                                         pulsatile waveforms +---------+---------------+---------+-----------+----------+-------------------+ PTV      Full                                                             +---------+---------------+---------+-----------+----------+-------------------+ PERO     Full                                                             +---------+---------------+---------+-----------+----------+-------------------+   +---------+---------------+---------+-----------+----------+-------------------+ LEFT     CompressibilityPhasicitySpontaneityPropertiesThrombus Aging      +---------+---------------+---------+-----------+----------+-------------------+ CFV      Full                                         pulsatile waveforms +---------+---------------+---------+-----------+----------+-------------------+ SFJ      Full                                                             +---------+---------------+---------+-----------+----------+-------------------+ FV  Prox  Full                                                             +---------+---------------+---------+-----------+----------+-------------------+ FV Mid   Full                                                             +---------+---------------+---------+-----------+----------+-------------------+ FV DistalFull                                                             +---------+---------------+---------+-----------+----------+-------------------+ PFV      Full                                                             +---------+---------------+---------+-----------+----------+-------------------+ POP  pulsatile waveforms +---------+---------------+---------+-----------+----------+-------------------+ PTV      Full                                                             +---------+---------------+---------+-----------+----------+-------------------+ PERO     Full                                                             +---------+---------------+---------+-----------+----------+-------------------+     Summary: RIGHT: - There is no evidence of deep vein thrombosis in the lower extremity.  Pulsatile waveforms suggestive of fluid overload  LEFT: - There is no evidence of deep vein thrombosis in the lower extremity.  Pulsatile waveforms suggestive of fluid overload.  *See table(s) above for measurements and observations. Electronically signed by Monica Martinez MD on 05/05/2021 at 3:35:10 PM.    Final    VAS [place] UPPER EXTREMITY VENOUS DUPLEX  Result Date: 05/05/2021 UPPER VENOUS STUDY  Patient Name:  ELLEN GORIS  Date of Exam:   05/05/2021 Medical Rec #: [id number]      Accession #:    [id number] Date of Birth: 09/12/1940      Patient Gender: F Patient Age:   46 years Exam Location:  [facility] Procedure:      VAS [place] UPPER EXTREMITY VENOUS DUPLEX Referring Phys: Loree Fee HARRIS  --------------------------------------------------------------------------------  Indications: Edema Risk Factors: COPD, GI Bleed. Limitations: Poor ultrasound/tissue interface and edema. Comparison Study: No prior study on file Performing Technologist: Sharion Dove RVS  Examination Guidelines: A complete evaluation includes B-mode imaging, spectral Doppler, color Doppler, and power Doppler as needed of all accessible portions of each vessel. Bilateral testing is considered an integral part of a complete examination. Limited examinations for reoccurring indications may be performed as noted.  Right Findings: +----------+------------+---------+-----------+----------+-------------------+ RIGHT     CompressiblePhasicitySpontaneousProperties      Summary       +----------+------------+---------+-----------+----------+-------------------+ IJV           Full                                  pulsatile waveforms +----------+------------+---------+-----------+----------+-------------------+ Subclavian                                          pulsatile waveforms +----------+------------+---------+-----------+----------+-------------------+ Axillary                                            pulsatile waveforms +----------+------------+---------+-----------+----------+-------------------+ Brachial      Full                                  pulsatile waveforms +----------+------------+---------+-----------+----------+-------------------+ Radial        Full                                                      +----------+------------+---------+-----------+----------+-------------------+  Ulnar                                                 patent by color   +----------+------------+---------+-----------+----------+-------------------+ Cephalic    Partial                                  Age Indeterminate   +----------+------------+---------+-----------+----------+-------------------+ Basilic       Full                                                      +----------+------------+---------+-----------+----------+-------------------+  Left Findings: +----------+------------+---------+-----------+----------+-------------------+ LEFT      CompressiblePhasicitySpontaneousProperties      Summary       +----------+------------+---------+-----------+----------+-------------------+ Subclavian                                          pulsatile waveforms +----------+------------+---------+-----------+----------+-------------------+  Summary:  Right: No evidence of deep vein thrombosis in the upper extremity. Findings consistent with non occlusive, age indeterminate superficial vein thrombosis involving a small segment of the right cephalic vein at the distal upper arm. Waveforms are pulsatile suggestive of fluid overload.  Left: No evidence of thrombosis in the subclavian.  *See table(s) above for measurements and observations.  Diagnosing physician: Monica Martinez MD Electronically signed by Monica Martinez MD on 05/05/2021 at 3:35:36 PM.    Final    DG ESOPHAGUS W SINGLE CM (SOL OR THIN BA)  Result Date: 05/07/2021 CLINICAL DATA:  Difficulty swallowing. EXAM: ESOPHOGRAM/BARIUM SWALLOW TECHNIQUE: Single contrast examination was performed using  thin barium. FLUOROSCOPY TIME:  Fluoroscopy Time:  1 minute 30 seconds Radiation Exposure Index (if provided by the fluoroscopic device): Number of Acquired Spot Images: None. COMPARISON:  CT chest 05/05/2021. FINDINGS: A limited study was done due to patient condition. Poor esophageal motility with tertiary contractions. Esophageal fold thickening. There is narrowing at the gastroesophageal junction, through which a 13 mm barium tablet would not pass. IMPRESSION: 1. Stricture at the gastroesophageal junction, through which a 13 mm barium pill would not pass.  2. Esophageal dysmotility. 3. Esophageal fold thickening. Electronically Signed   By: Lorin Picket M.D.   On: 05/07/2021 09:53    Medications: I have reviewed the patient's current medications.  Assessment: Dysphagia-barium swallow which showed stricture at GE junction through which a 13 mm barium pill would not pass, esophageal dysmotility and esophageal fold thickening  Recent diverticular bleed, hemoglobin 7.9, no recent melena or hematochezia, normal BUN  Multiple comorbidities-acute on chronic hypoxic and hypercapnic respiratory failure, off BiPAP  Plan: Diagnostic EGD in a.m. tomorrow with anesthesia with plans for balloon dilation. Clear liquid diet today.  Ronnette Juniper, MD 05/07/2021, 10:26 AM

## 2021-05-07 NOTE — Progress Notes (Signed)
PROGRESS NOTE    [name]  WFU:[id number] DOB: 1941-05-07 DOA: 05/01/2021 PCP: [name], MD   Brief Narrative: 80 year old with past medical history significant for COPD on home oxygen at night, hypertension admitted on June of this year for diverticular bleed underwent embolization of severe bleeding presents to the ER complaining of bloody stool in low hemoglobin. Patient hemoglobin was found to be 5.8, she received 2 unit of packed red blood cells in the ED.  GI has been consulted. Her hemoglobin has remained stable.  Her bleeding appears to have stopped.  Patient  on 11/03 was noted to be hypoxic on room air.  She is usually on 2 L of oxygen only at night. She was found to be tachycardic with heart rate in the 130s, oxygen saturation 70 on room air.  She was placed on 2  L of oxygen with improvement of her oxygen saturation.  Chest x-ray showed basilar chest density bilaterally right side greater than left.  Findings raise concern for pneumonia. Started on IV antibiotics.   Patient became more lethargic on 11/04.. ABG Ph 7.3 PCO2 68. Patient will be started on BIPAP/ transfer to progressive bed. Subsequently became more hypoxic. CCM consulted. Transfer to ICU. Received IV lasix, started on Nebulizer. Oxygen requirement has decreased, down to 1 L oxygen.    Assessment & Plan:   Principal Problem:   Acute GI bleeding Active Problems:   COPD GOLD II    Acute blood loss anemia   1-Acute blood loss anemia, acute GI bleed: Presume diverticular bleed.  Patient presented with a hemoglobin of 5.6.  She has received 2 units of packed red blood cell. Continue with PPI BID.  If she has any recurrent GI bleed she may need a stat CTA. Hb down to 7.9 but after IV fluid and Bolus, suspect hemodilution. No evidence of recurrent GI bleed. Plan to repeat CBC in am, sooner if evidence of bleeding.   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  Patient is 2 L of oxygen at  bedtime. Oxygen down to 74 RA in am, placed on 2 L oxygen.  Secondary to possibly PNA and acute exacerbation of COPD>  X-ray: Show bilateral density worse on the right concern for pneumonia. Continue with IV Unasyn. Lethargic 11/04: . ABG Ph 7.3 PCO 64. Placed on BIPAP.  Repeated Blood gas ABG PCO2 improved, oxygen decreased. Transfer to ICU, CCM consulted.  Received one dose of lasix 11/04 On Brovana, IV steroids, [place].  Oxygen requirement down to 1 L oxygen. CTA negative for PE>  Transition to oral prednisone soon.   3-Dysphagia;  Patient today report pill got stuck esophagus, then ate some crackers and got stuck as well. She report intermittent problems with dysphagia for last 3 months.  This might explain her aspiration PNA.  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  Plan for endoscopy tomorrow.  Clear liquid diet.   Acute metabolic encephalopathy;  Related to hypercapnia, hypoxemia,  Resolved. Alert.   COPD on nocturnal oxygen: See number 2.  Hypertension/ tachycardia; change  propanolol/ tp PRN. She uses at home PRN.  RLS: Continue with ropinirole Depression: Continue with Zoloft Low BP; resolved with fluids.    Pressure Injury 12/20/20 Elbow Anterior;Right (Active)  12/20/20 1300  Location: Elbow  Location Orientation: Anterior;Right  Staging:   Wound Description (Comments):   Present on Admission:       Estimated body mass index is 30.88 kg/m as calculated from the following:   Height  as of 02/01/21: 5\' 4"  (1.626 m).   Weight as of this encounter: 81.6 kg.   DVT prophylaxis: SCD Code Status: Full code Family Communication: Husband updated 11/06 Disposition Plan:  Status is: Inpatient.   The patient will require care spanning > 2 midnights and should be moved to inpatient because: Acute on chronic hypoxic hypercapnic respiratory failure, secondary to pneumonia, COPD exacerbation, patient require BiPAP and high flow oxygen.       Consultants:   GI  Procedures:  None  Antimicrobials:  Unasyn 05/03/2021  Subjective: Denies dyspnea.  She is tolerating clear diet.  She was able to ambulate yesterday   Objective: Vitals:   05/07/21 0800 05/07/21 1058 05/07/21 1145 05/07/21 1508  BP: (!) 104/55     Pulse: 75     Resp: 17     Temp:   (!) 97.5 F (36.4 C) 98.2 F (36.8 C)  TempSrc:   Oral Oral  SpO2: 99% 93%    Weight:        Intake/Output Summary (Last 24 hours) at 05/07/2021 1513 Last data filed at 05/07/2021 0700 Gross per 24 hour  Intake 549.63 ml  Output 500 ml  Net 49.63 ml    Filed Weights   05/03/21 1300  Weight: 81.6 kg    Examination:  General exam: NAD Respiratory system: BL air movement, no wheezing Cardiovascular system: S 1, S 2 RRR Gastrointestinal system: BS present, soft, nt Central nervous system: alert, follows command Extremities: trace edema  Data Reviewed: I have personally reviewed following labs and imaging studies  CBC: Recent Labs  Lab 05/03/21 0048 05/03/21 1233 05/04/21 0947 05/04/21 1817 05/04/21 2049 05/05/21 0009 05/06/21 0227 05/07/21 0230 05/07/21 0445  WBC 7.4   < > 6.5 6.5  --  7.1 6.3  --  7.3  NEUTROABS 5.5  --   --   --   --   --   --   --   --   HGB 8.0*   < > 8.1* 8.1* 8.2* 8.7* 8.9* 8.8* 7.9*  HCT 25.8*   < > 26.7* 26.8* 24.0* 28.9* 28.2* 28.6* 26.6*  MCV 80.1   < > 83.4 83.8  --  83.0 81.3  --  83.6  PLT 166   < > 163 160  --  161 192  --  163   < > = values in this interval not displayed.    Basic Metabolic Panel: Recent Labs  Lab 05/02/21 1603 05/03/21 0048 05/04/21 1817 05/04/21 2049 05/05/21 0009 05/05/21 0802 05/06/21 0227 05/07/21 0445  NA  --  137 136 137 138 138 135 137  K  --  3.6 3.8 3.6 3.9 4.4 3.5 3.5  CL  --  104 98  --  99 100 92* 97*  CO2  --  29 32  --  30 30 36* 33*  GLUCOSE  --  106* 96  --  106* 95 153* 90  BUN  --  <5* 6*  --  7* 8 10 12   CREATININE  --  0.46 0.53  --  0.57 0.56 0.55 0.52  CALCIUM  --  8.4* 8.3*  --   8.6* 8.7* 8.4* 8.5*  MG 2.0 2.1  --   --  2.0  --   --  2.0  PHOS  --  3.7  --   --  3.2  --   --   --     GFR: Estimated Creatinine Clearance: 56.1 mL/min (by C-G formula based  on SCr of 0.52 mg/dL). Liver Function Tests: Recent Labs  Lab 05/01/21 1839 05/03/21 0048  AST 20 18  ALT 13 11  ALKPHOS 62 52  BILITOT 0.4 0.6  PROT 6.2* 5.2*  ALBUMIN 3.4* 2.9*    No results for input(s): LIPASE, AMYLASE in the last 168 hours. No results for input(s): AMMONIA in the last 168 hours. Coagulation Profile: Recent Labs  Lab 05/03/21 0048  INR 1.2    Cardiac Enzymes: No results for input(s): CKTOTAL, CKMB, CKMBINDEX, TROPONINI in the last 168 hours. BNP (last 3 results) No results for input(s): PROBNP in the last 8760 hours. HbA1C: No results for input(s): HGBA1C in the last 72 hours. CBG: Recent Labs  Lab 05/04/21 1941 05/04/21 2309 05/05/21 0313 05/05/21 0756 05/06/21 1955  GLUCAP 83 105* 115* 108* 111*    Lipid Profile: No results for input(s): CHOL, HDL, LDLCALC, TRIG, CHOLHDL, LDLDIRECT in the last 72 hours. Thyroid Function Tests: No results for input(s): TSH, T4TOTAL, FREET4, T3FREE, THYROIDAB in the last 72 hours. Anemia Panel: No results for input(s): VITAMINB12, FOLATE, FERRITIN, TIBC, IRON, RETICCTPCT in the last 72 hours.  Sepsis Labs: No results for input(s): PROCALCITON, LATICACIDVEN in the last 168 hours.  Recent Results (from the past 240 hour(s))  Resp Panel by RT-PCR (Flu A&B, Covid) Nasopharyngeal Swab     Status: None   Collection Time: 05/02/21 12:28 AM   Specimen: Nasopharyngeal Swab; Nasopharyngeal(NP) swabs in vial transport medium  Result Value Ref Range Status   SARS Coronavirus 2 by RT PCR NEGATIVE NEGATIVE Final    Comment: (NOTE) SARS-CoV-2 target nucleic acids are NOT DETECTED.  The SARS-CoV-2 RNA is generally detectable in upper respiratory specimens during the acute phase of infection. The lowest concentration of SARS-CoV-2 viral  copies this assay can detect is 138 copies/mL. A negative result does not preclude SARS-Cov-2 infection and should not be used as the sole basis for treatment or other patient management decisions. A negative result may occur with  improper specimen collection/handling, submission of specimen other than nasopharyngeal swab, presence of viral mutation(s) within the areas targeted by this assay, and inadequate number of viral copies(<138 copies/mL). A negative result must be combined with clinical observations, patient history, and epidemiological information. The expected result is Negative.  Fact Sheet for Patients:  EntrepreneurPulse.com.au  Fact Sheet for Healthcare Providers:  IncredibleEmployment.be  This test is no t yet approved or cleared by the [place] FDA and  has been authorized for detection and/or diagnosis of SARS-CoV-2 by FDA under an Emergency Use Authorization (EUA). This EUA will remain  in effect (meaning this test can be used) for the duration of the COVID-19 declaration under Section 564(b)(1) of the Act, 21 U.S.C.section 360bbb-3(b)(1), unless the authorization is terminated  or revoked sooner.       Influenza A by PCR NEGATIVE NEGATIVE Final   Influenza B by PCR NEGATIVE NEGATIVE Final    Comment: (NOTE) The Xpert Xpress SARS-CoV-2/FLU/RSV plus assay is intended as an aid in the diagnosis of influenza from Nasopharyngeal swab specimens and should not be used as a sole basis for treatment. Nasal washings and aspirates are unacceptable for Xpert Xpress SARS-CoV-2/FLU/RSV testing.  Fact Sheet for Patients: EntrepreneurPulse.com.au  Fact Sheet for Healthcare Providers: IncredibleEmployment.be  This test is not yet approved or cleared by the [place] FDA and has been authorized for detection and/or diagnosis of SARS-CoV-2 by FDA under an Emergency Use Authorization (EUA). This  EUA will remain in effect (meaning this test  can be used) for the duration of the COVID-19 declaration under Section 564(b)(1) of the Act, 21 U.S.C. section 360bbb-3(b)(1), unless the authorization is terminated or revoked.  Performed at [facility], [place] [address] [place], [place] [address]   MRSA Next Gen by PCR, Nasal     Status: None   Collection Time: 05/04/21  7:28 PM   Specimen: Nasal Mucosa; Nasal Swab  Result Value Ref Range Status   MRSA by PCR Next Gen NOT DETECTED NOT DETECTED Final    Comment: (NOTE) The GeneXpert MRSA Assay (FDA approved for NASAL specimens only), is one component of a comprehensive MRSA colonization surveillance program. It is not intended to diagnose MRSA infection nor to guide or monitor treatment for MRSA infections. Test performance is not FDA approved in patients less than 47 years old. Performed at [facility], [place] [address], [place], [place] [address]           Radiology Studies: CT Angio Chest Pulmonary Embolism (PE) W or WO Contrast  Result Date: 05/05/2021 CLINICAL DATA:  Hypoxia on room air May 03, 2021. Tachycardia. Low O2 sats. Recent chest x-ray demonstrated bibasilar opacities suggesting possible pneumonia. EXAM: CT ANGIOGRAPHY CHEST WITH CONTRAST TECHNIQUE: Multidetector CT imaging of the chest was performed using the standard protocol during bolus administration of intravenous contrast. Multiplanar CT image reconstructions and MIPs were obtained to evaluate the vascular anatomy. CONTRAST:  55mL OMNIPAQUE IOHEXOL 350 MG/ML SOLN COMPARISON:  Chest x-ray May 04, 2021 FINDINGS: Cardiovascular: Calcified atherosclerosis in the left anterior descending coronary artery and circumflex artery. Mild cardiomegaly. Calcified atherosclerosis in the nonaneurysmal thoracic aorta. No dissection. No pulmonary emboli identified. Mediastinum/Nodes: Small pleural effusions. No pericardial effusion. The visualized esophagus is  normal. Visualized thyroid is unremarkable. Mildly enlarged right mediastinal node on series 5, image 45. No other adenopathy. No chest wall abnormalities. Lungs/Pleura: Central airways are normal. No pneumothorax. Platelike opacity in the right middle lobe, likely atelectasis. Patchy nodularity in the adjacent right middle lobe, likely infectious or inflammatory. The largest nodular component measures 4 mm. Opacities associated with the bilateral pleural effusions are likely atelectasis. No other infiltrates. There is a nodule in the posterior left apex on axial image 6, coronal image 80, and sagittal image 98 measuring 11 by 13 by 8 mm in transverse, craniocaudal, and AP dimensions with a mean diameter of 11 mm. No other suspicious nodules or masses. Upper Abdomen: No changes since June of 2022. Musculoskeletal: Severe compression fracture at L1, unchanged since June of 2022. Review of the MIP images confirms the above findings. IMPRESSION: 1. No pulmonary emboli. 2. 11 mm nodule in the left apex posteriorly. Consider one of the following in 3 months for both low-risk and high-risk individuals: (a) repeat chest CT, (b) follow-up PET-CT, or (c) tissue sampling. This recommendation follows the consensus statement: Guidelines for Management of Incidental Pulmonary Nodules Detected on CT Images: From the Fleischner Society 2017; Radiology 2017; 284:228-243. 3. Patchy nodularity in the right middle lobe, likely infectious or inflammatory. Recommend attention on follow-up. 4. Calcified atherosclerosis in the left coronary arteries as above. Calcified atherosclerosis in the thoracic aorta. 5. Mild cardiomegaly. 6. Small pleural effusions with associated atelectasis. 7. A single mildly abnormal mediastinal node on series 5, image 45 measures 12 mm, nonspecific. This could be reactive. Recommend attention on follow-up. 8. Severe chronic compression fracture of L1. Aortic Atherosclerosis (ICD10-I70.0). Electronically Signed    By: Dorise Bullion III M.D.   On: 05/05/2021 17:52  DG ESOPHAGUS W SINGLE CM (SOL OR THIN BA)  Result Date: 05/07/2021 CLINICAL DATA:  Difficulty swallowing. EXAM: ESOPHOGRAM/BARIUM SWALLOW TECHNIQUE: Single contrast examination was performed using  thin barium. FLUOROSCOPY TIME:  Fluoroscopy Time:  1 minute 30 seconds Radiation Exposure Index (if provided by the fluoroscopic device): Number of Acquired Spot Images: None. COMPARISON:  CT chest 05/05/2021. FINDINGS: A limited study was done due to patient condition. Poor esophageal motility with tertiary contractions. Esophageal fold thickening. There is narrowing at the gastroesophageal junction, through which a 13 mm barium tablet would not pass. IMPRESSION: 1. Stricture at the gastroesophageal junction, through which a 13 mm barium pill would not pass. 2. Esophageal dysmotility. 3. Esophageal fold thickening. Electronically Signed   By: Lorin Picket M.D.   On: 05/07/2021 09:53        Scheduled Meds:  arformoterol  15 mcg Nebulization BID   Chlorhexidine Gluconate Cloth  6 each Topical Daily   dextrose  25 mL Intravenous Once   dorzolamide-timolol  1 drop Both Eyes BID   latanoprost  1 drop Both Eyes QHS   methylPREDNISolone (SOLU-MEDROL) injection  40 mg Intravenous Daily   modafinil  100 mg Oral Daily   ondansetron  4 mg Oral Q12H   pantoprazole (PROTONIX) IV  40 mg Intravenous Q12H   revefenacin  175 mcg Nebulization Daily   sertraline  150 mg Oral Daily   simvastatin  20 mg Oral q1800   Continuous Infusions:  sodium chloride 10 mL (05/07/21 1100)   ampicillin-sulbactam (UNASYN) IV 3 g (05/07/21 1455)   lactated ringers with kcl 50 mL/hr at 05/07/21 1453     LOS: 4 days    Time spent: 35 minutes.     Elmarie Shiley, MD [facility]   If 7PM-7AM, please contact night-coverage [email or web address]  05/07/2021, 3:13 PM

## 2021-05-08 ENCOUNTER — Encounter (HOSPITAL_COMMUNITY)
Admission: EM | Disposition: A | Discharge status: Home Health | Payer: Self-pay | Source: Home / Self Care | Attending: Internal Medicine

## 2021-05-08 ENCOUNTER — Inpatient Hospital Stay (HOSPITAL_COMMUNITY): Payer: Medicare HMO | Admitting: Anesthesiology

## 2021-05-08 ENCOUNTER — Encounter (HOSPITAL_COMMUNITY): Payer: Self-pay | Admitting: Internal Medicine

## 2021-05-08 DIAGNOSIS — K922 Gastrointestinal hemorrhage, unspecified: Secondary | ICD-10-CM | POA: Diagnosis not present

## 2021-05-08 HISTORY — PX: ESOPHAGOGASTRODUODENOSCOPY (EGD) WITH PROPOFOL: SHX5813

## 2021-05-08 HISTORY — PX: BALLOON DILATION: SHX5330

## 2021-05-08 HISTORY — PX: BIOPSY: SHX5522

## 2021-05-08 LAB — CBC
HCT: 26.6 % — ABNORMAL LOW (ref 36.0–46.0)
Hemoglobin: 8 g/dL — ABNORMAL LOW (ref 12.0–15.0)
MCH: 25.4 pg — ABNORMAL LOW (ref 26.0–34.0)
MCHC: 30.1 g/dL (ref 30.0–36.0)
MCV: 84.4 fL (ref 80.0–100.0)
Platelets: 160 10*3/uL (ref 150–400)
RBC: 3.15 MIL/uL — ABNORMAL LOW (ref 3.87–5.11)
RDW: 21.2 % — ABNORMAL HIGH (ref 11.5–15.5)
WBC: 5.8 10*3/uL (ref 4.0–10.5)
nRBC: 0 % (ref 0.0–0.2)

## 2021-05-08 LAB — BASIC METABOLIC PANEL
Anion gap: 7 (ref 5–15)
BUN: 8 mg/dL (ref 8–23)
CO2: 32 mmol/L (ref 22–32)
Calcium: 8.7 mg/dL — ABNORMAL LOW (ref 8.9–10.3)
Chloride: 99 mmol/L (ref 98–111)
Creatinine, Ser: 0.58 mg/dL (ref 0.44–1.00)
GFR, Estimated: >60 mL/min (ref >60)
Glucose, Bld: 87 mg/dL (ref 70–99)
Potassium: 3.8 mmol/L (ref 3.5–5.1)
Sodium: 138 mmol/L (ref 135–145)

## 2021-05-08 SURGERY — ESOPHAGOGASTRODUODENOSCOPY (EGD) WITH PROPOFOL
Anesthesia: Monitor Anesthesia Care

## 2021-05-08 MED ORDER — PROPOFOL 500 MG/50ML IV EMUL
INTRAVENOUS | Status: DC | PRN
Start: 2021-05-08 — End: 2021-05-08
  Administered 2021-05-08: 75 ug/kg/min via INTRAVENOUS

## 2021-05-08 MED ORDER — LACTATED RINGERS IV SOLN: INTRAVENOUS | Status: DC | PRN

## 2021-05-08 MED ORDER — PROPOFOL 10 MG/ML IV BOLUS
INTRAVENOUS | Status: DC | PRN
  Administered 2021-05-08: 20 mg via INTRAVENOUS

## 2021-05-08 MED ORDER — FLUCONAZOLE 100MG IVPB: 100.0000 mg | Freq: Once | INTRAVENOUS | Status: DC

## 2021-05-08 SURGICAL SUPPLY — 15 items

## 2021-05-08 NOTE — Progress Notes (Signed)
PT Cancellation Note  Patient Details Name: [name] MRN: [id number] DOB: 27-Nov-1940   Cancelled Treatment:    Reason Eval/Treat Not Completed: Patient at procedure or test/unavailable (pt leaving for endoscopy)   Sudie Bandel B Sunshine Mackowski 05/08/2021, 7:32 AM Bayard Males, PT Acute Rehabilitation Services Pager: [phone] Office: [phone]

## 2021-05-08 NOTE — Anesthesia Procedure Notes (Signed)
Procedure Name: MAC Date/Time: 05/08/2021 8:16 AM Performed by: Carolan Clines, CRNA Pre-anesthesia Checklist: Patient identified, Emergency Drugs available, Suction available and Patient being monitored Patient Re-evaluated:Patient Re-evaluated prior to induction Oxygen Delivery Method: Nasal cannula Dental Injury: Teeth and Oropharynx as per pre-operative assessment

## 2021-05-08 NOTE — Transfer of Care (Signed)
Immediate Anesthesia Transfer of Care Note  Patient: Kristine Royal Goede  Procedure(s) Performed: ESOPHAGOGASTRODUODENOSCOPY (EGD) WITH PROPOFOL BALLOON DILATION BIOPSY  Patient Location: Endoscopy Unit  Anesthesia Type:MAC  Level of Consciousness: drowsy  Airway & Oxygen Therapy: Patient Spontanous Breathing and Patient connected to nasal cannula oxygen  Post-op Assessment: Report given to RN and Post -op Vital signs reviewed and stable  Post vital signs: Reviewed and stable  Last Vitals:  Vitals Value Taken Time  BP 108/33   Temp    Pulse 86   Resp 19   SpO2 98     Last Pain:  Vitals:   05/08/21 0754  TempSrc: Oral  PainSc: 0-No pain      Patients Stated Pain Goal:  (IV site; Potassium runs turned down) (16/24/46 9507)  Complications: No notable events documented.

## 2021-05-08 NOTE — Progress Notes (Signed)
Physical Therapy Treatment Patient Details Name: [name] MRN: [id number] DOB: 1940/12/09 Today's Date: 05/08/2021   History of Present Illness 80 y.o. female admitted 05/01/2021 with anemia and GIB. PMH includes COPD on home O2 at night, HTN    PT Comments    Pt pleasant and willing to mobilize. Pt able to get OOB and to bathroom but declined further gait due to lunch arrival. Spouse present to witness mobility and limited assist needed and he confirmed that he would be able to physically assist and that she uses rail and his assist to enter home via stairs. Will continue to follow and encouraged OOB to toilet and for meals.   Pt on 1L throughout with SpO2 >92-94%. Attempted RA with drop to 87% with transition to sitting    Recommendations for follow up therapy are one component of a multi-disciplinary discharge planning process, led by the attending physician.  Recommendations may be updated based on patient status, additional functional criteria and insurance authorization.  Follow Up Recommendations  Home health PT     Assistance Recommended at Discharge Frequent or constant Supervision/Assistance  Equipment Recommendations  BSC/3in1;Other (comment) (transport chair)    Recommendations for Other Services       Precautions / Restrictions Precautions Precautions: Other (comment);Fall Precaution Comments: poor vision     Mobility  Bed Mobility Overal bed mobility: Needs Assistance Bed Mobility: Supine to Sit     Supine to sit: Min assist     General bed mobility comments: min HHA to fully elevate trunk with HOB flat, increased time to scoot to edge of surface    Transfers Overall transfer level: Needs assistance   Transfers: Sit to/from Stand Sit to Stand: Min guard           General transfer comment: cues for hand placement to stand from bed and toilet with guarding for lines and balance    Ambulation/Gait Ambulation/Gait assistance: Min guard Gait  Distance (Feet): 20 Feet Assistive device: Rolling walker (2 wheels) Gait Pattern/deviations: Step-through pattern;Decreased stride length;Decreased step length - left;Trunk flexed   Gait velocity interpretation: <1.8 ft/sec, indicate of risk for recurrent falls   General Gait Details: pt with short steps and mod cues for directing RW in room. Pt walked 20' then 12' with cues and assist for lines. Pt denied further ambulation due to lunch arrival and prior NPO status.   Stairs             Wheelchair Mobility    Modified Rankin (Stroke Patients Only)       Balance Overall balance assessment: Needs assistance Sitting-balance support: No upper extremity supported;Feet supported Sitting balance-Leahy Scale: Fair Sitting balance - Comments: EOb without UE support   Standing balance support: Bilateral upper extremity supported;During functional activity;No upper extremity supported Standing balance-Leahy Scale: Fair Standing balance comment: no UE support at sink to wash hands, RW for gait                            Cognition Arousal/Alertness: Awake/alert Behavior During Therapy: WFL for tasks assessed/performed Overall Cognitive Status: Impaired/Different from baseline                       Memory: Decreased short-term memory       Problem Solving: Slow processing;Requires verbal cues          Exercises      General Comments  Pertinent Vitals/Pain Pain Assessment: No/denies pain    Home Living                          Prior Function            PT Goals (current goals can now be found in the care plan section) Progress towards PT goals: Progressing toward goals    Frequency    Min 3X/week      PT Plan Current plan remains appropriate    Co-evaluation              AM-PAC PT "6 Clicks" Mobility   Outcome Measure  Help needed turning from your back to your side while in a flat bed without using  bedrails?: None Help needed moving from lying on your back to sitting on the side of a flat bed without using bedrails?: A Little Help needed moving to and from a bed to a chair (including a wheelchair)?: A Little Help needed standing up from a chair using your arms (e.g., wheelchair or bedside chair)?: A Little Help needed to walk in hospital room?: A Little Help needed climbing 3-5 steps with a railing? : A Lot 6 Click Score: 18    End of Session Equipment Utilized During Treatment: Oxygen Activity Tolerance: Patient tolerated treatment well Patient left: in chair;with call bell/phone within reach;with family/visitor present;with nursing/sitter in room Nurse Communication: Mobility status PT Visit Diagnosis: Unsteadiness on feet (R26.81);Other abnormalities of gait and mobility (R26.89);Muscle weakness (generalized) (M62.81)     Time: 1601-0932 PT Time Calculation (min) (ACUTE ONLY): 27 min  Charges:  $Gait Training: 8-22 mins $Therapeutic Activity: 8-22 mins                     [name] P, PT Acute Rehabilitation Services Pager: [phone] Office: [place] 05/08/2021, 1:26 PM

## 2021-05-08 NOTE — Progress Notes (Addendum)
PROGRESS NOTE    [name]  IZT:[id number] DOB: [birth year] DOA: 05/01/2021 PCP: [name], MD   Brief Narrative: 80 year old with past medical history significant for COPD on home oxygen at night, hypertension admitted on June of this year for diverticular bleed underwent embolization of severe bleeding presents to the ER complaining of bloody stool in low hemoglobin. Patient hemoglobin was found to be 5.8, she received 2 unit of packed red blood cells in the ED.  GI has been consulted. Her hemoglobin has remained stable.  Her bleeding appears to have stopped.  Patient  on 11/03 was noted to be hypoxic on room air.  She is usually on 2 L of oxygen only at night. She was found to be tachycardic with heart rate in the 130s, oxygen saturation 70 on room air.  She was placed on 2  L of oxygen with improvement of her oxygen saturation.  Chest x-ray showed basilar chest density bilaterally right side greater than left.  Findings raise concern for pneumonia. Started on IV antibiotics.   Patient became more lethargic on 11/04.. ABG Ph 7.3 PCO2 68. Patient will be started on BIPAP/ transfer to progressive bed. Subsequently became more hypoxic. CCM consulted. Transfer to ICU. Received IV lasix, started on Nebulizer. Oxygen requirement has decreased, down to 1 L oxygen.   Underwent endoscopy 11/08 for abnormal esophagogram which showed stricture.  Endoscopy showed diffuse, white plaques in the operative for and middle third of the esophagus.  Biopsy taken.  Awaiting results.  Discussed with GI plan to treat for candidal esophagitis depending on biopsy results.  An obvious narrowing or a stricture was not evident.  Empiric balloon dilation performed. Plan to resume regular diet.  Assessment & Plan:   Principal Problem:   Acute GI bleeding Active Problems:   COPD GOLD II    Acute blood loss anemia   1-Acute blood loss anemia, acute GI bleed: Presume diverticular bleed.  Patient presented with  a hemoglobin of 5.6.  She has received 2 units of packed red blood cell. Continue with PPI BID.  If she has any recurrent GI bleed she may need a stat CTA. 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  Patient is 2 L of oxygen at bedtime. Oxygen down to 74 RA in am, placed on 2 L oxygen.  Secondary to possibly PNA and acute exacerbation of COPD>  X-ray: Show bilateral density worse on the right concern for pneumonia. Continue with IV Unasyn. Lethargic 11/04: . ABG Ph 7.3 PCO 64. Placed on BIPAP.  Repeated Blood gas ABG PCO2 improved, oxygen decreased. Transfer to ICU, CCM consulted.  Received one dose of lasix 11/04 On [place], [place].  Oxygen requirement down to 1 L oxygen. CTA negative for PE>  Transition to oral prednisone tomorrow, short  taper.  3-Dysphagia;  Patient today report pill got stuck esophagus, then ate some crackers and got stuck as well. She report intermittent problems with dysphagia for last 3 months.  This might explain her aspiration PNA.  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  Endoscopy: white plaque esophagus, biopsy take to rule out candida esophagitis vs eosinophilic esophagitis.  Continue with PPI.  Discussed with Dr Therisa Doyne, plan to change diet to mechanical Dysphagia 3, frequent small meals. 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 Plan to await Bx result to treat for candidiasis, to avoid adverse effect from fluconazole.    Acute metabolic encephalopathy;  Related to hypercapnia, hypoxemia,  Resolved. Alert.   COPD  on nocturnal oxygen: See number 2.  Lung nodule; follow up with pulmonologist out patient.  Hypertension/ tachycardia; change  propanolol/ tp PRN. She uses at home PRN.  RLS: Continue with ropinirole Depression: Continue with Zoloft Low BP; resolved with fluids.    Pressure Injury 12/20/20 Elbow Anterior;Right (Active)  12/20/20 1300  Location: Elbow  Location Orientation: Anterior;Right   Staging:   Wound Description (Comments):   Present on Admission:       Estimated body mass index is 30.88 kg/m as calculated from the following:   Height as of 02/01/21: 5\' 4"  (1.626 m).   Weight as of this encounter: 81.6 kg.   DVT prophylaxis: SCD Code Status: Full code Family Communication: Husband updated 11/07 Disposition Plan:  Status is: Inpatient.   The patient will require care spanning > 2 midnights and should be moved to inpatient because: Acute on chronic hypoxic hypercapnic respiratory failure, secondary to pneumonia, COPD exacerbation, patient require BiPAP and high flow oxygen.       Consultants:  GI  Procedures:  None  Antimicrobials:  Unasyn 05/03/2021  Subjective: She just came from endoscopy. Feels ok, would like to eat.    Objective: Vitals:   05/08/21 1000 05/08/21 1100 05/08/21 1142 05/08/21 1200  BP: (!) 116/48 (!) 135/54  123/63  Pulse: 71 82  81  Resp: 18 16  18   Temp:   98.5 F (36.9 C)   TempSrc:   Oral   SpO2: 100% 99%  95%  Weight:        Intake/Output Summary (Last 24 hours) at 05/08/2021 1300 Last data filed at 05/08/2021 0900 Gross per 24 hour  Intake 1444.82 ml  Output 1600 ml  Net -155.18 ml    Filed Weights   05/03/21 1300  Weight: 81.6 kg    Examination:  General exam: NAD Respiratory system: CTA Cardiovascular system: S 1, S 2 RRR Gastrointestinal system:BS present, soft, nt Central nervous system:Alert, follows command Extremities: trace edema  Data Reviewed: I have personally reviewed following labs and imaging studies  CBC: Recent Labs  Lab 05/03/21 0048 05/03/21 1233 05/04/21 1817 05/04/21 2049 05/05/21 0009 05/06/21 0227 05/07/21 0230 05/07/21 0445 05/08/21 0419  WBC 7.4   < > 6.5  --  7.1 6.3  --  7.3 5.8  NEUTROABS 5.5  --   --   --   --   --   --   --   --   HGB 8.0*   < > 8.1*   < > 8.7* 8.9* 8.8* 7.9* 8.0*  HCT 25.8*   < > 26.8*   < > 28.9* 28.2* 28.6* 26.6* 26.6*  MCV 80.1   < >  83.8  --  83.0 81.3  --  83.6 84.4  PLT 166   < > 160  --  161 192  --  163 160   < > = values in this interval not displayed.    Basic Metabolic Panel: Recent Labs  Lab 05/02/21 1603 05/03/21 0048 05/04/21 1817 05/05/21 0009 05/05/21 0802 05/06/21 0227 05/07/21 0445 05/08/21 0419  NA  --  137   < > 138 138 135 137 138  K  --  3.6   < > 3.9 4.4 3.5 3.5 3.8  CL  --  104   < > 99 100 92* 97* 99  CO2  --  29   < > 30 30 36* 33* 32  GLUCOSE  --  106*   < > 106* 95 153*  90 87  BUN  --  <5*   < > 7* 8 10 12 8   CREATININE  --  0.46   < > 0.57 0.56 0.55 0.52 0.58  CALCIUM  --  8.4*   < > 8.6* 8.7* 8.4* 8.5* 8.7*  MG 2.0 2.1  --  2.0  --   --  2.0  --   PHOS  --  3.7  --  3.2  --   --   --   --    < > = values in this interval not displayed.    GFR: Estimated Creatinine Clearance: 56.1 mL/min (by C-G formula based on SCr of 0.58 mg/dL). Liver Function Tests: Recent Labs  Lab 05/01/21 1839 05/03/21 0048  AST 20 18  ALT 13 11  ALKPHOS 62 52  BILITOT 0.4 0.6  PROT 6.2* 5.2*  ALBUMIN 3.4* 2.9*    No results for input(s): LIPASE, AMYLASE in the last 168 hours. No results for input(s): AMMONIA in the last 168 hours. Coagulation Profile: Recent Labs  Lab 05/03/21 0048  INR 1.2    Cardiac Enzymes: No results for input(s): CKTOTAL, CKMB, CKMBINDEX, TROPONINI in the last 168 hours. BNP (last 3 results) No results for input(s): PROBNP in the last 8760 hours. HbA1C: No results for input(s): HGBA1C in the last 72 hours. CBG: Recent Labs  Lab 05/04/21 2309 05/05/21 0313 05/05/21 0756 05/06/21 1955 05/07/21 1955  GLUCAP 105* 115* 108* 111* 123*    Lipid Profile: No results for input(s): CHOL, HDL, LDLCALC, TRIG, CHOLHDL, LDLDIRECT in the last 72 hours. Thyroid Function Tests: No results for input(s): TSH, T4TOTAL, FREET4, T3FREE, THYROIDAB in the last 72 hours. Anemia Panel: No results for input(s): VITAMINB12, FOLATE, FERRITIN, TIBC, IRON, RETICCTPCT in the last 72  hours.  Sepsis Labs: No results for input(s): PROCALCITON, LATICACIDVEN in the last 168 hours.  Recent Results (from the past 240 hour(s))  Resp Panel by RT-PCR (Flu A&B, Covid) Nasopharyngeal Swab     Status: None   Collection Time: 05/02/21 12:28 AM   Specimen: Nasopharyngeal Swab; Nasopharyngeal(NP) swabs in vial transport medium  Result Value Ref Range Status   SARS Coronavirus 2 by RT PCR NEGATIVE NEGATIVE Final    Comment: (NOTE) SARS-CoV-2 target nucleic acids are NOT DETECTED.  The SARS-CoV-2 RNA is generally detectable in upper respiratory specimens during the acute phase of infection. The lowest concentration of SARS-CoV-2 viral copies this assay can detect is 138 copies/mL. A negative result does not preclude SARS-Cov-2 infection and should not be used as the sole basis for treatment or other patient management decisions. A negative result may occur with  improper specimen collection/handling, submission of specimen other than nasopharyngeal swab, presence of viral mutation(s) within the areas targeted by this assay, and inadequate number of viral copies(<138 copies/mL). A negative result must be combined with clinical observations, patient history, and epidemiological information. The expected result is Negative.  Fact Sheet for Patients:  EntrepreneurPulse.com.au  Fact Sheet for Healthcare Providers:  IncredibleEmployment.be  This test is no t yet approved or cleared by the [place] FDA and  has been authorized for detection and/or diagnosis of SARS-CoV-2 by FDA under an Emergency Use Authorization (EUA). This EUA will remain  in effect (meaning this test can be used) for the duration of the COVID-19 declaration under Section 564(b)(1) of the Act, 21 U.S.C.section 360bbb-3(b)(1), unless the authorization is terminated  or revoked sooner.       Influenza A by PCR NEGATIVE NEGATIVE Final  Influenza B by PCR NEGATIVE  NEGATIVE Final    Comment: (NOTE) The Xpert Xpress SARS-CoV-2/FLU/RSV plus assay is intended as an aid in the diagnosis of influenza from Nasopharyngeal swab specimens and should not be used as a sole basis for treatment. Nasal washings and aspirates are unacceptable for Xpert Xpress SARS-CoV-2/FLU/RSV testing.  Fact Sheet for Patients: EntrepreneurPulse.com.au  Fact Sheet for Healthcare Providers: IncredibleEmployment.be  This test is not yet approved or cleared by the [place] FDA and has been authorized for detection and/or diagnosis of SARS-CoV-2 by FDA under an Emergency Use Authorization (EUA). This EUA will remain in effect (meaning this test can be used) for the duration of the COVID-19 declaration under Section 564(b)(1) of the Act, 21 U.S.C. section 360bbb-3(b)(1), unless the authorization is terminated or revoked.  Performed at [facility], [place] [address] [place], [place] [address]   MRSA Next Gen by PCR, Nasal     Status: None   Collection Time: 05/04/21  7:28 PM   Specimen: Nasal Mucosa; Nasal Swab  Result Value Ref Range Status   MRSA by PCR Next Gen NOT DETECTED NOT DETECTED Final    Comment: (NOTE) The GeneXpert MRSA Assay (FDA approved for NASAL specimens only), is one component of a comprehensive MRSA colonization surveillance program. It is not intended to diagnose MRSA infection nor to guide or monitor treatment for MRSA infections. Test performance is not FDA approved in patients less than 45 years old. Performed at [facility], [place] [address] [place],  [address]           Radiology Studies: DG ESOPHAGUS W SINGLE CM (SOL OR THIN BA)  Result Date: 05/07/2021 CLINICAL DATA:  Difficulty swallowing. EXAM: ESOPHOGRAM/BARIUM SWALLOW TECHNIQUE: Single contrast examination was performed using  thin barium. FLUOROSCOPY TIME:  Fluoroscopy Time:  1 minute 30 seconds Radiation Exposure Index (if  provided by the fluoroscopic device): Number of Acquired Spot Images: None. COMPARISON:  CT chest 05/05/2021. FINDINGS: A limited study was done due to patient condition. Poor esophageal motility with tertiary contractions. Esophageal fold thickening. There is narrowing at the gastroesophageal junction, through which a 13 mm barium tablet would not pass. IMPRESSION: 1. Stricture at the gastroesophageal junction, through which a 13 mm barium pill would not pass. 2. Esophageal dysmotility. 3. Esophageal fold thickening. Electronically Signed   By: Lorin Picket M.D.   On: 05/07/2021 09:53        Scheduled Meds:  arformoterol  15 mcg Nebulization BID   Chlorhexidine Gluconate Cloth  6 each Topical Daily   dextrose  25 mL Intravenous Once   dorzolamide-timolol  1 drop Both Eyes BID   latanoprost  1 drop Both Eyes QHS   methylPREDNISolone (SOLU-MEDROL) injection  40 mg Intravenous Daily   modafinil  100 mg Oral Daily   ondansetron  4 mg Oral Q12H   pantoprazole (PROTONIX) IV  40 mg Intravenous Q12H   revefenacin  175 mcg Nebulization Daily   sertraline  150 mg Oral Daily   simvastatin  20 mg Oral q1800   Continuous Infusions:  sodium chloride Stopped (05/08/21 0207)   lactated ringers with kcl 50 mL/hr at 05/08/21 1152     LOS: 5 days    Time spent: 35 minutes.     Elmarie Shiley, MD [facility]   If 7PM-7AM, please contact night-coverage [email or web address]  05/08/2021, 1:00 PM

## 2021-05-08 NOTE — Anesthesia Postprocedure Evaluation (Signed)
Anesthesia Post Note  Patient: [name]  Procedure(s) Performed: ESOPHAGOGASTRODUODENOSCOPY (EGD) WITH PROPOFOL BALLOON DILATION BIOPSY     Patient location during evaluation: PACU Anesthesia Type: MAC Level of consciousness: awake and alert Pain management: pain level controlled Vital Signs Assessment: post-procedure vital signs reviewed and stable Respiratory status: spontaneous breathing, nonlabored ventilation and respiratory function stable Cardiovascular status: blood pressure returned to baseline and stable Postop Assessment: no apparent nausea or vomiting Anesthetic complications: no   No notable events documented.  Last Vitals:  Vitals:   05/08/21 0850 05/08/21 0900  BP: (!) 109/33 (!) 119/37  Pulse: 84 77  Resp: 19 (!) 23  Temp:    SpO2: 98% 99%    Last Pain:  Vitals:   05/08/21 0900  TempSrc:   PainSc: 0-No pain                 Pervis Hocking

## 2021-05-08 NOTE — Interval H&P Note (Signed)
History and Physical Interval Note: 80/female with long standing dysphagia, esophageal stricture noted on barium swallow, for an EGD with balloon dilation with propofol.  05/08/2021 8:00 AM  Bonnielee Haff  has presented today for EGD with balloon dilation, with the diagnosis of Esophageal stricture.  The various methods of treatment have been discussed with the patient and family. After consideration of risks, benefits and other options for treatment, the patient has consented to  Procedure(s): ESOPHAGOGASTRODUODENOSCOPY (EGD) WITH PROPOFOL (N/A) as a surgical intervention.  The patient's history has been reviewed, patient examined, no change in status, stable for surgery.  I have reviewed the patient's chart and labs.  Questions were answered to the patient's satisfaction.     Ronnette Juniper

## 2021-05-08 NOTE — Op Note (Signed)
[facility] Patient Name: [name] Procedure Date : 05/08/2021 MRN: [id number] Attending MD: Ronnette Juniper , MD Date of Birth: Mar 17, 1941 CSN: [id number] Age: 80 Admit Type: Inpatient Procedure:                Upper GI endoscopy Indications:              Dysphagia, Abnormal cine-esophagram, severe                            esophageal dysmotility noted on esophagogram and                            distal esophageal stricture which did not allow                            passage of 13 mm barium tablet Providers:                Ronnette Juniper, MD, Burtis Junes, RN, Tyna Jaksch                            Technician Referring MD:             [facility] Medicines:                Monitored Anesthesia Care Complications:            No immediate complications. Estimated blood loss:                            Minimal. Estimated Blood Loss:     Estimated blood loss was minimal. Procedure:                Pre-Anesthesia Assessment:                           - Prior to the procedure, a History and Physical                            was performed, and patient medications and                            allergies were reviewed. The patient's tolerance of                            previous anesthesia was also reviewed. The risks                            and benefits of the procedure and the sedation                            options and risks were discussed with the patient.                            All questions were answered, and informed consent                            was obtained.  Prior Anticoagulants: The patient has                            taken no previous anticoagulant or antiplatelet                            agents. ASA Grade Assessment: IV - A patient with                            severe systemic disease that is a constant threat                            to life. After reviewing the risks and benefits,                            the patient was deemed  in satisfactory condition to                            undergo the procedure.                           After obtaining informed consent, the endoscope was                            passed under direct vision. Throughout the                            procedure, the patient's blood pressure, pulse, and                            oxygen saturations were monitored continuously. The                            [id number]-H190 ([id number]) Olympus endoscope was introduced                            through the mouth, and advanced to the second part                            of duodenum. The upper GI endoscopy was                            accomplished without difficulty. The patient                            tolerated the procedure well. Scope In: Scope Out: Findings:      Diffuse, white plaques were found in the upper third of the esophagus       and in the middle third of the esophagus. Biopsies were taken with a       cold forceps for histology for candida and also for evaluation for       eosinophilic esophagitis.      The Z-line was regular and was found 40 cm from the incisors.      An obvious narrowing  or stricture was not evident.      Empiric balloon dilation performed.      A TTS dilator was passed through the scope. Dilation with an 18-19-20 mm       balloon dilator was performed to 20 mm for 2 consecutive minutes. The       dilation site was examined following endoscope reinsertion and showed       mild mucosal disruption and mild improvement in luminal narrowing.      Multiple small sessile benign appearing polyps with no bleeding and no       stigmata of recent bleeding were found in the gastric fundus, in the       gastric body and on the greater curvature of the stomach.      The incisura, gastric antrum and pylorus were normal.      The examined duodenum was normal. Impression:               - Esophageal plaques were found, suspicious for                            candidiasis.  Biopsied.                           - Z-line regular, 40 cm from the incisors. Dilated.                           - Multiple gastric polyps.                           - Normal incisura, antrum and pylorus.                           - Normal examined duodenum. Moderate Sedation:      Patient did not receive moderate sedation for this procedure, but       instead received monitored anesthesia care. Recommendation:           - Resume regular diet.                           - Continue present medications.                           - Await pathology results. Procedure Code(s):        --- Professional ---                           [phone], Esophagogastroduodenoscopy, flexible,                            transoral; with transendoscopic balloon dilation of                            esophagus (less than 30 mm diameter)                           [id number], 59, Esophagogastroduodenoscopy, flexible,                            transoral;  with biopsy, single or multiple Diagnosis Code(s):        --- Professional ---                           K22.9, Disease of esophagus, unspecified                           K31.7, Polyp of stomach and duodenum                           R13.10, Dysphagia, unspecified                           R93.3, Abnormal findings on diagnostic imaging of                            other parts of digestive tract CPT copyright 2019 [employer]. All rights reserved. The codes documented in this report are preliminary and upon coder review may  be revised to meet current compliance requirements. Ronnette Juniper, MD 05/08/2021 8:48:28 AM This report has been signed electronically. Number of Addenda: 0

## 2021-05-09 ENCOUNTER — Encounter (HOSPITAL_COMMUNITY): Payer: Self-pay | Admitting: Gastroenterology

## 2021-05-09 DIAGNOSIS — R1319 Other dysphagia: Secondary | ICD-10-CM

## 2021-05-09 DIAGNOSIS — J449 Chronic obstructive pulmonary disease, unspecified: Secondary | ICD-10-CM | POA: Diagnosis not present

## 2021-05-09 DIAGNOSIS — D62 Acute posthemorrhagic anemia: Secondary | ICD-10-CM | POA: Diagnosis not present

## 2021-05-09 DIAGNOSIS — K922 Gastrointestinal hemorrhage, unspecified: Secondary | ICD-10-CM | POA: Diagnosis not present

## 2021-05-09 LAB — SURGICAL PATHOLOGY

## 2021-05-09 MED ORDER — POLYETHYLENE GLYCOL 3350 17 G PO PACK: 17.0000 g | PACK | Freq: Every day | ORAL | Status: DC | PRN

## 2021-05-09 MED ORDER — PREDNISONE 20 MG PO TABS
40.0000 mg | ORAL_TABLET | Freq: Every day | ORAL | Status: DC
  Administered 2021-05-09 – 2021-05-10 (×2): 40 mg via ORAL
  Filled 2021-05-09 (×2): qty 2

## 2021-05-09 MED ORDER — PANTOPRAZOLE SODIUM 40 MG PO TBEC
40.0000 mg | DELAYED_RELEASE_TABLET | Freq: Two times a day (BID) | ORAL | Status: DC
  Administered 2021-05-09 – 2021-05-10 (×3): 40 mg via ORAL
  Filled 2021-05-09 (×3): qty 1

## 2021-05-09 NOTE — Progress Notes (Signed)
PROGRESS NOTE    [name]  UKG:[id number] DOB: 1941-03-04 DOA: 05/01/2021 PCP: [name], MD   Brief Narrative: 80 year old with past medical history significant for COPD on home oxygen at night, hypertension admitted on June of this year for diverticular bleed s/p embolization for severe bleeding presented to the ER complaining of bloody stool. Patient hemoglobin was found to be 5.8, she received 2 unit of packed red blood cells in the ED.  Patient  on 11/03 was noted to be hypoxic on room air.  She is usually on 2 L of oxygen only at night. She was found to be tachycardic with heart rate in the 130s, oxygen saturation 70 on room air.  She was placed on 2  L of oxygen with improvement of her oxygen saturation.  Chest x-ray showed basilar chest density bilaterally right side greater than left.  Findings raise concern for pneumonia. Started on IV antibiotics.  Patient became more lethargic on 11/04. ABG Ph 7.3 PCO2 68.  Patient was placed on BiPAP with improvement.  He was given IV furosemide.  Critical care medicine was consulted.  Underwent endoscopy 11/08 for abnormal esophagogram which showed stricture.  Endoscopy showed diffuse, white plaques in the operative for and middle third of the esophagus.  Biopsy taken.  Awaiting results.  Discussed with GI plan to treat for candidal esophagitis depending on biopsy results.  An obvious narrowing or a stricture was not evident.  Empiric balloon dilation performed.   Assessment & Plan:   Acute blood loss anemia, acute GI bleed/Presume diverticular bleed Patient presented with a hemoglobin of 5.6.  She has received 2 units of packed red blood cell. Continue with PPI BID.  Bleeding appears to have subsided.  If she has recurrence of her bleeding she may need a CT angiogram but for now she remained stable.  We will recheck her hemoglobin tomorrow morning.  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 Oxygen down to 74 RA in am, placed on 2 L oxygen.  Secondary to possibly PNA and acute exacerbation of COPD  X-ray: Show bilateral density worse on the right concern for pneumonia. Patient was placed on Unasyn.  She appears to have completed course of same.  Respiratory status is stable.  Has not required BiPAP recently.  Remains on her home inhalers.  CT angiogram was negative for PE.  Noted to be on Solu-Medrol.  Will transition to prednisone today.    Dysphagia likely secondary to dysmotility Patient reported history suggestive of esophageal dysphagia.  Barium swallow suggested stricture at the GE junction.  Underwent endoscopy which did not show any obvious obstruction.  Empiric dilation was performed.  White plaque was noted and biopsies were taken.  Waiting on pathology report before initiating antifungal treatment.  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    Acute metabolic encephalopathy;  Related to hypercapnia, hypoxemia,  Resolved. Alert.   Pulmonary nodules Incidentally noted on CT scan.  Will need outpatient surveillance.  Essential hypertension Blood pressure reasonably well controlled.  Occasional low readings noted.  She is only on as needed agents as of now.    RLS Continue with ropinirole  Depression Continue with Zoloft  Obesity Estimated body mass index is 30.88 kg/m as calculated from the following:   Height as of 02/01/21: 5\' 4"  (1.626 m).   Weight as of this encounter: 81.6 kg.   DVT prophylaxis: SCD Code Status: Full code Family Communication: Husband updated  11/07 Disposition Plan: Home with home health as recommended by physical therapy  Status is: Inpatient  Remains inpatient appropriate because: GI bleed, acute respiratory failure with hypoxia.     Consultants:  GI  Procedures:  None  Antimicrobials:  Unasyn 05/03/2021  Subjective: Patient mentions that she wants to go home.  Denies any chest pain shortness of breath.   No nausea vomiting.  Wants to eat her breakfast.   Objective: Vitals:   05/09/21 0700 05/09/21 0727 05/09/21 0728 05/09/21 0813  BP:      Pulse: 82     Resp: 19     Temp:    97.9 F (36.6 C)  TempSrc:    Oral  SpO2: 93% 94% 100%   Weight:        Intake/Output Summary (Last 24 hours) at 05/09/2021 1001 Last data filed at 05/08/2021 1400 Gross per 24 hour  Intake 105.39 ml  Output --  Net 105.39 ml    Filed Weights   05/03/21 1300  Weight: 81.6 kg    Examination:  General appearance: Awake alert.  In no distress Resp: Normal effort at rest.  Diminished air entry at the bases.  Few crackles bilaterally.  No wheezing or rhonchi. Cardio: S1-S2 is normal regular.  No S3-S4.  No rubs murmurs or bruit GI: Abdomen is soft.  Nontender nondistended.  Bowel sounds are present normal.  No masses organomegaly Extremities: No edema.  Full range of motion of lower extremities. Neurologic:  No focal neurological deficits.    Data Reviewed: I have personally reviewed following labs and imaging studies  CBC: Recent Labs  Lab 05/03/21 0048 05/03/21 1233 05/04/21 1817 05/04/21 2049 05/05/21 0009 05/06/21 0227 05/07/21 0230 05/07/21 0445 05/08/21 0419  WBC 7.4   < > 6.5  --  7.1 6.3  --  7.3 5.8  NEUTROABS 5.5  --   --   --   --   --   --   --   --   HGB 8.0*   < > 8.1*   < > 8.7* 8.9* 8.8* 7.9* 8.0*  HCT 25.8*   < > 26.8*   < > 28.9* 28.2* 28.6* 26.6* 26.6*  MCV 80.1   < > 83.8  --  83.0 81.3  --  83.6 84.4  PLT 166   < > 160  --  161 192  --  163 160   < > = values in this interval not displayed.    Basic Metabolic Panel: Recent Labs  Lab 05/02/21 1603 05/03/21 0048 05/04/21 1817 05/05/21 0009 05/05/21 0802 05/06/21 0227 05/07/21 0445 05/08/21 0419  NA  --  137   < > 138 138 135 137 138  K  --  3.6   < > 3.9 4.4 3.5 3.5 3.8  CL  --  104   < > 99 100 92* 97* 99  CO2  --  29   < > 30 30 36* 33* 32  GLUCOSE  --  106*   < > 106* 95 153* 90 87  BUN  --  <5*   < > 7*  8 10 12 8   CREATININE  --  0.46   < > 0.57 0.56 0.55 0.52 0.58  CALCIUM  --  8.4*   < > 8.6* 8.7* 8.4* 8.5* 8.7*  MG 2.0 2.1  --  2.0  --   --  2.0  --   PHOS  --  3.7  --  3.2  --   --   --   --    < > =  values in this interval not displayed.    GFR: Estimated Creatinine Clearance: 56.1 mL/min (by C-G formula based on SCr of 0.58 mg/dL). Liver Function Tests: Recent Labs  Lab 05/03/21 0048  AST 18  ALT 11  ALKPHOS 52  BILITOT 0.6  PROT 5.2*  ALBUMIN 2.9*     Coagulation Profile: Recent Labs  Lab 05/03/21 0048  INR 1.2     CBG: Recent Labs  Lab 05/04/21 2309 05/05/21 0313 05/05/21 0756 05/06/21 1955 05/07/21 1955  GLUCAP 105* 115* 108* 111* 123*      Recent Results (from the past 240 hour(s))  Resp Panel by RT-PCR (Flu A&B, Covid) Nasopharyngeal Swab     Status: None   Collection Time: 05/02/21 12:28 AM   Specimen: Nasopharyngeal Swab; Nasopharyngeal(NP) swabs in vial transport medium  Result Value Ref Range Status   SARS Coronavirus 2 by RT PCR NEGATIVE NEGATIVE Final    Comment: (NOTE) SARS-CoV-2 target nucleic acids are NOT DETECTED.  The SARS-CoV-2 RNA is generally detectable in upper respiratory specimens during the acute phase of infection. The lowest concentration of SARS-CoV-2 viral copies this assay can detect is 138 copies/mL. A negative result does not preclude SARS-Cov-2 infection and should not be used as the sole basis for treatment or other patient management decisions. A negative result may occur with  improper specimen collection/handling, submission of specimen other than nasopharyngeal swab, presence of viral mutation(s) within the areas targeted by this assay, and inadequate number of viral copies(<138 copies/mL). A negative result must be combined with clinical observations, patient history, and epidemiological information. The expected result is Negative.  Fact Sheet for Patients:   EntrepreneurPulse.com.au  Fact Sheet for Healthcare Providers:  IncredibleEmployment.be  This test is no t yet approved or cleared by the [place] FDA and  has been authorized for detection and/or diagnosis of SARS-CoV-2 by FDA under an Emergency Use Authorization (EUA). This EUA will remain  in effect (meaning this test can be used) for the duration of the COVID-19 declaration under Section 564(b)(1) of the Act, 21 U.S.C.section 360bbb-3(b)(1), unless the authorization is terminated  or revoked sooner.       Influenza A by PCR NEGATIVE NEGATIVE Final   Influenza B by PCR NEGATIVE NEGATIVE Final    Comment: (NOTE) The Xpert Xpress SARS-CoV-2/FLU/RSV plus assay is intended as an aid in the diagnosis of influenza from Nasopharyngeal swab specimens and should not be used as a sole basis for treatment. Nasal washings and aspirates are unacceptable for Xpert Xpress SARS-CoV-2/FLU/RSV testing.  Fact Sheet for Patients: EntrepreneurPulse.com.au  Fact Sheet for Healthcare Providers: IncredibleEmployment.be  This test is not yet approved or cleared by the [place] FDA and has been authorized for detection and/or diagnosis of SARS-CoV-2 by FDA under an Emergency Use Authorization (EUA). This EUA will remain in effect (meaning this test can be used) for the duration of the COVID-19 declaration under Section 564(b)(1) of the Act, 21 U.S.C. section 360bbb-3(b)(1), unless the authorization is terminated or revoked.  Performed at [facility], [place] [address], [place], [place] [address]   MRSA Next Gen by PCR, Nasal     Status: None   Collection Time: 05/04/21  7:28 PM   Specimen: Nasal Mucosa; Nasal Swab  Result Value Ref Range Status   MRSA by PCR Next Gen NOT DETECTED NOT DETECTED Final    Comment: (NOTE) The GeneXpert MRSA Assay (FDA approved for NASAL specimens only), is one component of a  comprehensive MRSA colonization surveillance  program. It is not intended to diagnose MRSA infection nor to guide or monitor treatment for MRSA infections. Test performance is not FDA approved in patients less than 33 years old. Performed at [facility], [place] [address] [place], [place] [address]           Radiology Studies: No results found.   Scheduled Meds:  arformoterol  15 mcg Nebulization BID   Chlorhexidine Gluconate Cloth  6 each Topical Daily   dextrose  25 mL Intravenous Once   dorzolamide-timolol  1 drop Both Eyes BID   latanoprost  1 drop Both Eyes QHS   methylPREDNISolone (SOLU-MEDROL) injection  40 mg Intravenous Daily   modafinil  100 mg Oral Daily   ondansetron  4 mg Oral Q12H   pantoprazole  40 mg Oral BID   revefenacin  175 mcg Nebulization Daily   sertraline  150 mg Oral Daily   simvastatin  20 mg Oral q1800   Continuous Infusions:  sodium chloride Stopped (05/08/21 0207)     LOS: 6 days     Betty Haff, MD [facility]   If 7PM-7AM, please contact night-coverage [email or web address]  05/09/2021, 10:01 AM

## 2021-05-09 NOTE — Progress Notes (Signed)
Gave Report to [place], RN at 1700. Patient transported to [address], no signs of distress noted. All belongings were at bedside and transported to new unit. Husband- Jeneen Rinks was notified of transfer.

## 2021-05-09 NOTE — Plan of Care (Signed)

## 2021-05-09 NOTE — Progress Notes (Signed)
Occupational Therapy Treatment Patient Details Name: [name] MRN: [id number] DOB: 1940/11/03 Today's Date: 05/09/2021   History of present illness 80 y.o. female admitted 05/01/2021 with anemia and GIB. PMH includes COPD on home O2 at night, HTN   OT comments  Patient supine in bed and agreeable to OT session. Supervision for bed mobility, min guard for transfers and in room mobility using RW given cueing for hand placement and safety due to visual deficits.  Min assist for LB dressing and min guard for grooming at sink.  She is on RA today with VSS, SpO2 maintained >92%.  Continues to require cueing for problem solving, awareness and safety. Will follow acutely.    Recommendations for follow up therapy are one component of a multi-disciplinary discharge planning process, led by the attending physician.  Recommendations may be updated based on patient status, additional functional criteria and insurance authorization.    Follow Up Recommendations  Home health OT    Assistance Recommended at Discharge Frequent or constant Supervision/Assistance  Equipment Recommendations  None recommended by OT    Recommendations for Other Services      Precautions / Restrictions Precautions Precautions: Other (comment);Fall Precaution Comments: poor vision Restrictions Weight Bearing Restrictions: No       Mobility Bed Mobility Overal bed mobility: Needs Assistance Bed Mobility: Supine to Sit     Supine to sit: Supervision     General bed mobility comments: HOB elevated, line mgmt and increased time    Transfers Overall transfer level: Needs assistance Equipment used: Rolling walker (2 wheels) Transfers: Sit to/from Stand Sit to Stand: Min guard           General transfer comment: cueing for hand placement and safety, min guard for balance     Balance Overall balance assessment: Needs assistance Sitting-balance support: No upper extremity supported;Feet supported Sitting  balance-Leahy Scale: Fair Sitting balance - Comments: dynamically with close supervision   Standing balance support: Bilateral upper extremity supported;During functional activity;No upper extremity supported Standing balance-Leahy Scale: Fair Standing balance comment: 0-2 hand support during ADLs standing with min guard, but relies on BUE support for mobility                           ADL either performed or assessed with clinical judgement   ADL Overall ADL's : Needs assistance/impaired     Grooming: Min guard;Standing;Oral care;Brushing hair               Lower Body Dressing: Minimal assistance;Sit to/from stand Lower Body Dressing Details (indicate cue type and reason): min assist to reach R sock, min guard in standing Toilet Transfer: Min guard;Ambulation;Rolling walker (2 wheels)           Functional mobility during ADLs: Min guard;Rolling walker (2 wheels)      Extremity/Trunk Assessment              Vision       Perception     Praxis      Cognition Arousal/Alertness: Awake/alert Behavior During Therapy: WFL for tasks assessed/performed Overall Cognitive Status: Impaired/Different from baseline Area of Impairment: Awareness;Problem solving                           Awareness: Emergent Problem Solving: Slow processing;Requires verbal cues General Comments: questionable visual hallucinations as pt reports " a man in a black suit told her she was going to a drs  appointment today", she was unsure why and remains oriented          Exercises     Shoulder Instructions       General Comments VSS on RA    Pertinent Vitals/ Pain       Pain Assessment: No/denies pain  Home Living                                          Prior Functioning/Environment              Frequency  Min 2X/week        Progress Toward Goals  OT Goals(current goals can now be found in the care plan section)  Progress  towards OT goals: Progressing toward goals  Acute Rehab OT Goals Patient Stated Goal: to get better OT Goal Formulation: With patient Time For Goal Achievement: 05/21/21 Potential to Achieve Goals: Good  Plan Discharge plan remains appropriate;Frequency remains appropriate    Co-evaluation                 AM-PAC OT "6 Clicks" Daily Activity     Outcome Measure   Help from another person eating meals?: A Little Help from another person taking care of personal grooming?: A Little Help from another person toileting, which includes using toliet, bedpan, or urinal?: A Little Help from another person bathing (including washing, rinsing, drying)?: A Little Help from another person to put on and taking off regular upper body clothing?: A Little Help from another person to put on and taking off regular lower body clothing?: A Little 6 Click Score: 18    End of Session Equipment Utilized During Treatment: Rolling walker (2 wheels)  OT Visit Diagnosis: Other abnormalities of gait and mobility (R26.89);Muscle weakness (generalized) (M62.81)   Activity Tolerance Patient tolerated treatment well   Patient Left in chair;with call bell/phone within reach;with chair alarm set   Nurse Communication Mobility status        Time: 4132-4401 OT Time Calculation (min): 28 min  Charges: OT General Charges $OT Visit: 1 Visit OT Treatments $Self Care/Home Management : 23-37 mins  [place] Pager [phone] Office [phone]   Delight Stare 05/09/2021, 10:36 AM

## 2021-05-09 NOTE — Progress Notes (Signed)
Patient arrived to room [id number].  Introduced to staff and to unit routine.  Bed placed in low position and locked.  Call light placed within reach and patient able to order supper.  States she is anxiously awaiting to potentially go home tomorrow.

## 2021-05-10 DIAGNOSIS — K922 Gastrointestinal hemorrhage, unspecified: Secondary | ICD-10-CM | POA: Diagnosis not present

## 2021-05-10 LAB — CBC
HCT: 28.1 % — ABNORMAL LOW (ref 36.0–46.0)
Hemoglobin: 8.4 g/dL — ABNORMAL LOW (ref 12.0–15.0)
MCH: 25.2 pg — ABNORMAL LOW (ref 26.0–34.0)
MCHC: 29.9 g/dL — ABNORMAL LOW (ref 30.0–36.0)
MCV: 84.4 fL (ref 80.0–100.0)
Platelets: 161 10*3/uL (ref 150–400)
RBC: 3.33 MIL/uL — ABNORMAL LOW (ref 3.87–5.11)
RDW: 21 % — ABNORMAL HIGH (ref 11.5–15.5)
WBC: 6.2 10*3/uL (ref 4.0–10.5)
nRBC: 0 % (ref 0.0–0.2)

## 2021-05-10 LAB — BASIC METABOLIC PANEL
Anion gap: 5 (ref 5–15)
BUN: 11 mg/dL (ref 8–23)
CO2: 29 mmol/L (ref 22–32)
Calcium: 8.4 mg/dL — ABNORMAL LOW (ref 8.9–10.3)
Chloride: 103 mmol/L (ref 98–111)
Creatinine, Ser: 0.57 mg/dL (ref 0.44–1.00)
GFR, Estimated: >60 mL/min (ref >60)
Glucose, Bld: 135 mg/dL — ABNORMAL HIGH (ref 70–99)
Potassium: 3.8 mmol/L (ref 3.5–5.1)
Sodium: 137 mmol/L (ref 135–145)

## 2021-05-10 MED ORDER — FLUCONAZOLE 100 MG PO TABS
100.0000 mg | ORAL_TABLET | Freq: Every day | ORAL | 0 refills | Status: AC
Start: 2021-05-11 — End: 2021-05-21

## 2021-05-10 MED ORDER — PANTOPRAZOLE SODIUM 40 MG PO TBEC: 40.0000 mg | DELAYED_RELEASE_TABLET | Freq: Two times a day (BID) | ORAL | 1 refills | Status: DC

## 2021-05-10 MED ORDER — PREDNISONE 10 MG PO TABS: ORAL_TABLET | ORAL | 0 refills | Status: DC

## 2021-05-10 MED ORDER — FLUCONAZOLE 100 MG PO TABS
100.0000 mg | ORAL_TABLET | Freq: Every day | ORAL | Status: DC
  Administered 2021-05-10: 100 mg via ORAL
  Filled 2021-05-10: qty 1

## 2021-05-10 MED ORDER — UMECLIDINIUM-VILANTEROL 62.5-25 MCG/ACT IN AEPB: 1.0000 | INHALATION_SPRAY | Freq: Every day | RESPIRATORY_TRACT | 1 refills | Status: AC

## 2021-05-10 NOTE — Care Management Important Message (Signed)
Important Message  Patient Details  Name: [name] MRN: [id number] Date of Birth: 1940/11/24   Medicare Important Message Given:  Yes     Dereke Neumann 05/10/2021, 3:10 PM

## 2021-05-10 NOTE — Progress Notes (Signed)
Physical Therapy Treatment Patient Details Name: [name] MRN: [id number] DOB: 1940/09/25 Today's Date: 05/10/2021   History of Present Illness 80 y.o. female admitted 05/01/2021 with anemia and GIB. PMH includes COPD on home O2 at night, HTN    PT Comments    Pt tolerates treatment well, ambulating for increased distances this session. Pt does continue to require some physical assistance to power up from lower surfaces at this time, and will likely need some assistance from spouse for stair negotiation upon return home.   Recommendations for follow up therapy are one component of a multi-disciplinary discharge planning process, led by the attending physician.  Recommendations may be updated based on patient status, additional functional criteria and insurance authorization.  Follow Up Recommendations  Home health PT     Assistance Recommended at Discharge Frequent or constant Supervision/Assistance  Equipment Recommendations   (pt owns RW)    Recommendations for Other Services       Precautions / Restrictions Precautions Precautions: Other (comment);Fall Precaution Comments: poor vision Restrictions Weight Bearing Restrictions: No     Mobility  Bed Mobility Overal bed mobility: Needs Assistance Bed Mobility: Supine to Sit;Sit to Supine     Supine to sit: Supervision Sit to supine: Supervision        Transfers Overall transfer level: Needs assistance Equipment used: Rolling walker (2 wheels) Transfers: Sit to/from Stand Sit to Stand: Min guard;Min assist           General transfer comment: minA from low commode, otherwise minG    Ambulation/Gait Ambulation/Gait assistance: Min guard Gait Distance (Feet): 60 Feet (2 additional trials of 15' to/from bed/bathroom) Assistive device: Rolling walker (2 wheels) Gait Pattern/deviations: Step-through pattern Gait velocity: reduced Gait velocity interpretation: <1.8 ft/sec, indicate of risk for recurrent  falls   General Gait Details: pt with slowed step-through gait   Stairs             Wheelchair Mobility    Modified Rankin (Stroke Patients Only)       Balance Overall balance assessment: Needs assistance Sitting-balance support: No upper extremity supported;Feet supported Sitting balance-Leahy Scale: Fair     Standing balance support: Bilateral upper extremity supported Standing balance-Leahy Scale: Fair Standing balance comment: pt is able to stand unsupported to wash hands at sink                            Cognition Arousal/Alertness: Awake/alert Behavior During Therapy: WFL for tasks assessed/performed Overall Cognitive Status: Impaired/Different from baseline Area of Impairment: Awareness                           Awareness: Emergent            Exercises      General Comments General comments (skin integrity, edema, etc.): VSS on RA      Pertinent Vitals/Pain Pain Assessment: No/denies pain    Home Living                          Prior Function            PT Goals (current goals can now be found in the care plan section) Acute Rehab PT Goals Patient Stated Goal: to return to prior level of function Progress towards PT goals: Progressing toward goals    Frequency    Min 3X/week      PT  Plan Current plan remains appropriate    Co-evaluation              AM-PAC PT "6 Clicks" Mobility   Outcome Measure  Help needed turning from your back to your side while in a flat bed without using bedrails?: None Help needed moving from lying on your back to sitting on the side of a flat bed without using bedrails?: A Little Help needed moving to and from a bed to a chair (including a wheelchair)?: A Little Help needed standing up from a chair using your arms (e.g., wheelchair or bedside chair)?: A Little Help needed to walk in hospital room?: A Little Help needed climbing 3-5 steps with a railing? : A  Lot 6 Click Score: 18    End of Session   Activity Tolerance: Patient tolerated treatment well Patient left: with call bell/phone within reach;in bed Nurse Communication: Mobility status PT Visit Diagnosis: Unsteadiness on feet (R26.81);Other abnormalities of gait and mobility (R26.89);Muscle weakness (generalized) (M62.81)     Time: 2194-7125 PT Time Calculation (min) (ACUTE ONLY): 18 min  Charges:  $Gait Training: 8-22 mins                     Zenaida Niece, PT, DPT Acute Rehabilitation Pager: [phone] Office [place] 05/10/2021, 2:49 PM

## 2021-05-10 NOTE — Progress Notes (Signed)
Esophageal biopsy showed Candida esophagitis. Discussed with patient's hospitalist Dr. Maryland Pink. Patient to receive Diflucan 100 mg p.o. daily for 7 days. Please recall GI if needed.

## 2021-05-10 NOTE — Consult Note (Signed)
   [facility] Inpatient Consult   05/10/2021  [name] [birth year] [id number]   [facility] Organization [ACO] Patient: [name] Medicare Primary Care Provider:  [name], MD [facility]  We have reviewed your referral patient with LLOS and high risk score for unplanned readmission.  Plan:  Referral for [facility] RN for post hospital follow up with CM.  Natividad Brood, RN BSN [facility] Liaison  [phone] business mobile phone Toll free office [phone]  Fax number: [phone] [email or web address]

## 2021-05-10 NOTE — Discharge Summary (Signed)
[facility]  Physician Discharge Summary   Patient ID: [name] MRN: [id number] DOB/AGE: Jan 22, 1941 80 y.o.  Admit date: 05/01/2021 Discharge date: 05/10/2021    PCP: [name], MD  DISCHARGE DIAGNOSES:  Acute GI bleed/presumed diverticular Acute blood loss anemia Dysphagia likely due to dysmotility Candida esophagitis Acute on chronic hypoxic/hypercapnic respiratory failure History of COPD Acute metabolic encephalopathy, resolved Pulmonary nodules Essential hypertension   RECOMMENDATIONS FOR OUTPATIENT FOLLOW UP: Will need surveillance for pulmonary nodules incidentally detected on CT scan in the hospital Pulmonology to arrange outpatient follow-up   Home Health: PT and OT Equipment/Devices: None  CODE STATUS: Full code  DISCHARGE CONDITION: fair  Diet recommendation: Dysphagia 3 diet  INITIAL HISTORY: 80 year old with past medical history significant for COPD on home oxygen at night, hypertension admitted on June of this year for diverticular bleed s/p embolization for severe bleeding presented to the ER complaining of bloody stool. Patient hemoglobin was found to be 5.8, she received 2 unit of packed red blood cells in the ED.  Patient  on 11/03 was noted to be hypoxic on room air.  She is usually on 2 L of oxygen only at night. She was found to be tachycardic with heart rate in the 130s, oxygen saturation 70 on room air.  She was placed on 2  L of oxygen with improvement of her oxygen saturation.  Chest x-ray showed basilar chest density bilaterally right side greater than left.  Findings raise concern for pneumonia. Started on IV antibiotics.  Patient became more lethargic on 11/04. ABG Ph 7.3 PCO2 68.  Patient was placed on BiPAP with improvement.  He was given IV furosemide.  Critical care medicine was consulted.  Underwent endoscopy 11/08 for abnormal esophagogram which showed stricture.  Endoscopy showed diffuse, white plaques in the operative for and  middle third of the esophagus.  Biopsy taken.  Awaiting results.  Discussed with GI plan to treat for candidal esophagitis depending on biopsy results.  An obvious narrowing or a stricture was not evident.  Empiric balloon dilation performed.  Consultations: Gastroenterology, [facility]  Procedures:  EGD Impression:               - Esophageal plaques were found, suspicious for                            candidiasis. Biopsied.                           - Z-line regular, 40 cm from the incisors. Dilated.                           - Multiple gastric polyps.                           - Normal incisura, antrum and pylorus.                           - Normal examined duodenum. Moderate Sedation:      Patient did not receive moderate sedation for this procedure, but       instead received monitored anesthesia care.   HOSPITAL COURSE:     Acute blood loss anemia, acute GI bleed/Presume diverticular bleed Patient presented with a hemoglobin of 5.6.  She has received 2 units of packed red  blood cell. Continue with PPI BID.  Bleeding appears to have subsided.  If she has recurrence of her bleeding she may need a CT angiogram but for now she remained stable.  Hemoglobin has been stable okay for discharge.   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 Oxygen down to 74 RA in am, placed on 2 L oxygen. Secondary to possibly PNA and acute exacerbation of COPD  X-ray: Show bilateral density worse on the right concern for pneumonia. Patient was given a course of Unasyn.  Respiratory status has been stable.  CT angiogram was negative for PE.  She was also given systemic steroids.   Inhaled steroids will also be prescribed as recommended by pulmonology.  Pulmonology to arrange outpatient follow-up.   Dysphagia likely secondary to dysmotility/Candida esophagitis Patient reported history suggestive of esophageal dysphagia.  Barium swallow  suggested stricture at the GE junction.  Underwent endoscopy which did not show any obvious obstruction.  Empiric dilation was performed.  White plaque was noted and biopsies were taken.  Pathology positive for Candida.  Will be discharged on Diflucan.  Discussed with GI.   Acute metabolic encephalopathy;  Resolved   Pulmonary nodules Incidentally noted on CT scan.  Will need outpatient surveillance.   Essential hypertension Stable   RLS Continue with ropinirole   Depression Continue with Zoloft   Obesity Estimated body mass index is 30.88 kg/m as calculated from the following:   Height as of 02/01/21: 5\' 4"  (1.626 m).   Weight as of this encounter: 81.6 kg.     Patient is stable.  Wants to go home.  Discussed with her husband.  Okay for discharge home today.  PERTINENT LABS:  The results of significant diagnostics from this hospitalization (including imaging, microbiology, ancillary and laboratory) are listed below for reference.    Microbiology: Recent Results (from the past 240 hour(s))  Resp Panel by RT-PCR (Flu A&B, Covid) Nasopharyngeal Swab     Status: None   Collection Time: 05/02/21 12:28 AM   Specimen: Nasopharyngeal Swab; Nasopharyngeal(NP) swabs in vial transport medium  Result Value Ref Range Status   SARS Coronavirus 2 by RT PCR NEGATIVE NEGATIVE Final    Comment: (NOTE) SARS-CoV-2 target nucleic acids are NOT DETECTED.  The SARS-CoV-2 RNA is generally detectable in upper respiratory specimens during the acute phase of infection. The lowest concentration of SARS-CoV-2 viral copies this assay can detect is 138 copies/mL. A negative result does not preclude SARS-Cov-2 infection and should not be used as the sole basis for treatment or other patient management decisions. A negative result may occur with  improper specimen collection/handling, submission of specimen other than nasopharyngeal swab, presence of viral mutation(s) within the areas targeted by this  assay, and inadequate number of viral copies(<138 copies/mL). A negative result must be combined with clinical observations, patient history, and epidemiological information. The expected result is Negative.  Fact Sheet for Patients:  EntrepreneurPulse.com.au  Fact Sheet for Healthcare Providers:  IncredibleEmployment.be  This test is no t yet approved or cleared by the [place] FDA and  has been authorized for detection and/or diagnosis of SARS-CoV-2 by FDA under an Emergency Use Authorization (EUA). This EUA will remain  in effect (meaning this test can be used) for the duration of the COVID-19 declaration under Section 564(b)(1) of the Act, 21 U.S.C.section 360bbb-3(b)(1), unless the authorization is terminated  or revoked sooner.       Influenza A by PCR  NEGATIVE NEGATIVE Final   Influenza B by PCR NEGATIVE NEGATIVE Final    Comment: (NOTE) The Xpert Xpress SARS-CoV-2/FLU/RSV plus assay is intended as an aid in the diagnosis of influenza from Nasopharyngeal swab specimens and should not be used as a sole basis for treatment. Nasal washings and aspirates are unacceptable for Xpert Xpress SARS-CoV-2/FLU/RSV testing.  Fact Sheet for Patients: EntrepreneurPulse.com.au  Fact Sheet for Healthcare Providers: IncredibleEmployment.be  This test is not yet approved or cleared by the [place] FDA and has been authorized for detection and/or diagnosis of SARS-CoV-2 by FDA under an Emergency Use Authorization (EUA). This EUA will remain in effect (meaning this test can be used) for the duration of the COVID-19 declaration under Section 564(b)(1) of the Act, 21 U.S.C. section 360bbb-3(b)(1), unless the authorization is terminated or revoked.  Performed at [facility], [place] [address] [place], [place] [address]   MRSA Next Gen by PCR, Nasal     Status: None   Collection Time: 05/04/21  7:28 PM    Specimen: Nasal Mucosa; Nasal Swab  Result Value Ref Range Status   MRSA by PCR Next Gen NOT DETECTED NOT DETECTED Final    Comment: (NOTE) The GeneXpert MRSA Assay (FDA approved for NASAL specimens only), is one component of a comprehensive MRSA colonization surveillance program. It is not intended to diagnose MRSA infection nor to guide or monitor treatment for MRSA infections. Test performance is not FDA approved in patients less than 17 years old. Performed at [facility], [place] [address], [place], [place] [address]      Labs:  COVID-19 Labs   Lab Results  Component Value Date   [place] 05/02/2021   [place] NEGATIVE 12/18/2020      Basic Metabolic Panel: Recent Labs  Lab 05/05/21 0009 05/05/21 0802 05/06/21 0227 05/07/21 0445 05/08/21 0419 05/10/21 0248  NA 138 138 135 137 138 137  K 3.9 4.4 3.5 3.5 3.8 3.8  CL 99 100 92* 97* 99 103  CO2 30 30 36* 33* 32 29  GLUCOSE 106* 95 153* 90 87 135*  BUN 7* 8 10 12 8 11   CREATININE 0.57 0.56 0.55 0.52 0.58 0.57  CALCIUM 8.6* 8.7* 8.4* 8.5* 8.7* 8.4*  MG 2.0  --   --  2.0  --   --   PHOS 3.2  --   --   --   --   --     CBC: Recent Labs  Lab 05/05/21 0009 05/06/21 0227 05/07/21 0230 05/07/21 0445 05/08/21 0419 05/10/21 0248  WBC 7.1 6.3  --  7.3 5.8 6.2  HGB 8.7* 8.9* 8.8* 7.9* 8.0* 8.4*  HCT 28.9* 28.2* 28.6* 26.6* 26.6* 28.1*  MCV 83.0 81.3  --  83.6 84.4 84.4  PLT 161 192  --  163 160 161    CBG: Recent Labs  Lab 05/04/21 2309 05/05/21 0313 05/05/21 0756 05/06/21 1955 05/07/21 1955  GLUCAP 105* 115* 108* 111* 123*     IMAGING STUDIES DG Chest 2 View  Result Date: 05/03/2021 CLINICAL DATA:  Hypoxia.  Shortness of breath. EXAM: CHEST - 2 VIEW COMPARISON:  09/14/2017 FINDINGS: Patchy densities at the lung bases, right side greater than left. Upper lungs are clear. Heart size is within normal limits. No large pleural effusions. Negative for a pneumothorax. Chronic  degenerative changes in both shoulders. IMPRESSION: Basilar chest densities bilaterally, right side greater than left. Findings raise concern for pneumonia particularly at the right lung base. Electronically Signed  By: Markus Daft M.D.   On: 05/03/2021 12:02   CT Angio Chest Pulmonary Embolism (PE) W or WO Contrast  Result Date: 05/05/2021 CLINICAL DATA:  Hypoxia on room air May 03, 2021. Tachycardia. Low O2 sats. Recent chest x-ray demonstrated bibasilar opacities suggesting possible pneumonia. EXAM: CT ANGIOGRAPHY CHEST WITH CONTRAST TECHNIQUE: Multidetector CT imaging of the chest was performed using the standard protocol during bolus administration of intravenous contrast. Multiplanar CT image reconstructions and MIPs were obtained to evaluate the vascular anatomy. CONTRAST:  28mL OMNIPAQUE IOHEXOL 350 MG/ML SOLN COMPARISON:  Chest x-ray May 04, 2021 FINDINGS: Cardiovascular: Calcified atherosclerosis in the left anterior descending coronary artery and circumflex artery. Mild cardiomegaly. Calcified atherosclerosis in the nonaneurysmal thoracic aorta. No dissection. No pulmonary emboli identified. Mediastinum/Nodes: Small pleural effusions. No pericardial effusion. The visualized esophagus is normal. Visualized thyroid is unremarkable. Mildly enlarged right mediastinal node on series 5, image 45. No other adenopathy. No chest wall abnormalities. Lungs/Pleura: Central airways are normal. No pneumothorax. Platelike opacity in the right middle lobe, likely atelectasis. Patchy nodularity in the adjacent right middle lobe, likely infectious or inflammatory. The largest nodular component measures 4 mm. Opacities associated with the bilateral pleural effusions are likely atelectasis. No other infiltrates. There is a nodule in the posterior left apex on axial image 6, coronal image 80, and sagittal image 98 measuring 11 by 13 by 8 mm in transverse, craniocaudal, and AP dimensions with a mean diameter of 11  mm. No other suspicious nodules or masses. Upper Abdomen: No changes since June of 2022. Musculoskeletal: Severe compression fracture at L1, unchanged since June of 2022. Review of the MIP images confirms the above findings. IMPRESSION: 1. No pulmonary emboli. 2. 11 mm nodule in the left apex posteriorly. Consider one of the following in 3 months for both low-risk and high-risk individuals: (a) repeat chest CT, (b) follow-up PET-CT, or (c) tissue sampling. This recommendation follows the consensus statement: Guidelines for Management of Incidental Pulmonary Nodules Detected on CT Images: From the Fleischner Society 2017; Radiology 2017; 284:228-243. 3. Patchy nodularity in the right middle lobe, likely infectious or inflammatory. Recommend attention on follow-up. 4. Calcified atherosclerosis in the left coronary arteries as above. Calcified atherosclerosis in the thoracic aorta. 5. Mild cardiomegaly. 6. Small pleural effusions with associated atelectasis. 7. A single mildly abnormal mediastinal node on series 5, image 45 measures 12 mm, nonspecific. This could be reactive. Recommend attention on follow-up. 8. Severe chronic compression fracture of L1. Aortic Atherosclerosis (ICD10-I70.0). Electronically Signed   By: Dorise Bullion III M.D.   On: 05/05/2021 17:52   DG CHEST PORT 1 VIEW  Result Date: 05/04/2021 CLINICAL DATA:  Hypoxemia.  GI bleed. EXAM: PORTABLE CHEST 1 VIEW COMPARISON:  05/03/2021 FINDINGS: The patient is rotated to the right with unchanged cardiomediastinal silhouette. Aortic atherosclerosis is noted. Patchy bibasilar airspace opacities of mildly increased. Small pleural effusions are questioned. No pneumothorax is identified although the patient's chin obscures the right lung apex. Degenerative changes are again noted involving the right greater than left shoulders. IMPRESSION: Mildly increased bibasilar opacities which may reflect infection or atelectasis. Possible small pleural effusions.  Electronically Signed   By: Logan Bores M.D.   On: 05/04/2021 18:53   ECHOCARDIOGRAM COMPLETE  Result Date: 05/05/2021    ECHOCARDIOGRAM REPORT   Patient Name:   EMAN RYNDERS Date of Exam: 05/05/2021 Medical Rec #:  [id number]     Height:       64.0 in Accession #:  [id number]    Weight:       179.9 lb Date of Birth:  May 25, 1941     BSA:          1.870 m Patient Age:    65 years      BP:           119/72 mmHg Patient Gender: F             HR:           90 bpm. Exam Location:  Inpatient Procedure: 2D Echo, Cardiac Doppler and Color Doppler Indications:    Dsypnea  History:        Patient has prior history of Echocardiogram examinations, most                 recent 09/11/2017.  Sonographer:    Merrie Roof RDCS Referring Phys: [place]  1. Left ventricular ejection fraction, by estimation, is 60 to 65%. The left ventricle has normal function. The left ventricle has no regional wall motion abnormalities. Left ventricular diastolic parameters are indeterminate.  2. Right ventricular systolic function is normal. The right ventricular size is normal. There is moderately elevated pulmonary artery systolic pressure.  3. Left atrial size was mildly dilated.  4. Right atrial size was mildly dilated.  5. The mitral valve is abnormal. Mild mitral valve regurgitation. No evidence of mitral stenosis. Moderate mitral annular calcification.  6. The aortic valve is tricuspid. There is moderate calcification of the aortic valve. Aortic valve regurgitation is mild. Mild to moderate aortic valve sclerosis/calcification is present, without any evidence of aortic stenosis.  7. The inferior vena cava is dilated in size with >50% respiratory variability, suggesting right atrial pressure of 8 mmHg. FINDINGS  Left Ventricle: Left ventricular ejection fraction, by estimation, is 60 to 65%. The left ventricle has normal function. The left ventricle has no regional wall motion abnormalities. The left ventricular  internal cavity size was normal in size. There is  no left ventricular hypertrophy. Left ventricular diastolic parameters are indeterminate. Right Ventricle: The right ventricular size is normal. No increase in right ventricular wall thickness. Right ventricular systolic function is normal. There is moderately elevated pulmonary artery systolic pressure. The tricuspid regurgitant velocity is 3.38 m/s, and with an assumed right atrial pressure of 8 mmHg, the estimated right ventricular systolic pressure is 62.8 mmHg. Left Atrium: Left atrial size was mildly dilated. Right Atrium: Right atrial size was mildly dilated. Pericardium: There is no evidence of pericardial effusion. Mitral Valve: The mitral valve is abnormal. There is moderate thickening of the mitral valve leaflet(s). There is moderate calcification of the mitral valve leaflet(s). Moderate mitral annular calcification. Mild mitral valve regurgitation. No evidence of mitral valve stenosis. Tricuspid Valve: The tricuspid valve is normal in structure. Tricuspid valve regurgitation is not demonstrated. No evidence of tricuspid stenosis. Aortic Valve: The aortic valve is tricuspid. There is moderate calcification of the aortic valve. Aortic valve regurgitation is mild. Mild to moderate aortic valve sclerosis/calcification is present, without any evidence of aortic stenosis. Aortic valve mean gradient measures 6.0 mmHg. Aortic valve peak gradient measures 11.4 mmHg. Aortic valve area, by VTI measures 2.20 cm. Pulmonic Valve: The pulmonic valve was normal in structure. Pulmonic valve regurgitation is not visualized. No evidence of pulmonic stenosis. Aorta: The aortic root is normal in size and structure. Venous: The inferior vena cava is dilated in size with greater than 50% respiratory variability, suggesting right atrial pressure of 8 mmHg. IAS/Shunts:  The interatrial septum was not well visualized.  LEFT VENTRICLE PLAX 2D LVIDd:         4.30 cm   Diastology  LVIDs:         2.80 cm   LV e' medial:    10.10 cm/s LV PW:         1.00 cm   LV E/e' medial:  15.6 LV IVS:        1.00 cm   LV e' lateral:   9.25 cm/s LVOT diam:     1.80 cm   LV E/e' lateral: 17.1 LV SV:         69 LV SV Index:   37 LVOT Area:     2.54 cm  RIGHT VENTRICLE          IVC RV Basal diam:  3.60 cm  IVC diam: 2.40 cm LEFT ATRIUM              Index        RIGHT ATRIUM           Index LA diam:        3.70 cm  1.98 cm/m   RA Area:     25.10 cm LA Vol (A2C):   100.0 ml 53.47 ml/m  RA Volume:   82.00 ml  43.84 ml/m LA Vol (A4C):   72.3 ml  38.66 ml/m LA Biplane Vol: 86.1 ml  46.04 ml/m  AORTIC VALVE AV Area (Vmax):    2.15 cm AV Area (Vmean):   2.14 cm AV Area (VTI):     2.20 cm AV Vmax:           169.00 cm/s AV Vmean:          117.000 cm/s AV VTI:            0.314 m AV Peak Grad:      11.4 mmHg AV Mean Grad:      6.0 mmHg LVOT Vmax:         143.00 cm/s LVOT Vmean:        98.500 cm/s LVOT VTI:          0.272 m LVOT/AV VTI ratio: 0.87  AORTA Ao Root diam: 2.90 cm Ao Asc diam:  2.80 cm MITRAL VALVE                TRICUSPID VALVE MV Area (PHT): 4.49 cm     TR Peak grad:   45.7 mmHg MV Decel Time: 169 msec     TR Vmax:        338.00 cm/s MV E velocity: 158.00 cm/s MV A velocity: 151.00 cm/s  SHUNTS MV E/A ratio:  1.05         Systemic VTI:  0.27 m                             Systemic Diam: 1.80 cm Jenkins Rouge MD Electronically signed by Jenkins Rouge MD Signature Date/Time: 05/05/2021/3:15:29 PM    Final    VAS [place] LOWER EXTREMITY VENOUS (DVT)  Result Date: 05/05/2021  Lower Venous DVT Study Patient Name:  JALIANA MEDELLIN  Date of Exam:   05/05/2021 Medical Rec #: [id number]      Accession #:    [id number] Date of Birth: [birth year]      Patient Gender: F Patient Age:   40 years Exam Location:  [facility] Procedure:      VAS  [place] LOWER EXTREMITY VENOUS (DVT) Referring Phys: [facility] HARRIS --------------------------------------------------------------------------------  Indications: Swelling, and  SOB.  Risk Factors: COPD. GI Bleed. Comparison Study: No prior study on file Performing Technologist: Sharion Dove RVS  Examination Guidelines: A complete evaluation includes B-mode imaging, spectral Doppler, color Doppler, and power Doppler as needed of all accessible portions of each vessel. Bilateral testing is considered an integral part of a complete examination. Limited examinations for reoccurring indications may be performed as noted. The reflux portion of the exam is performed with the patient in reverse Trendelenburg.  +---------+---------------+---------+-----------+----------+-------------------+ RIGHT    CompressibilityPhasicitySpontaneityPropertiesThrombus Aging      +---------+---------------+---------+-----------+----------+-------------------+ CFV      Full                                         pulsatile waveforms +---------+---------------+---------+-----------+----------+-------------------+ SFJ      Full                                                             +---------+---------------+---------+-----------+----------+-------------------+ FV Prox  Full                                                             +---------+---------------+---------+-----------+----------+-------------------+ FV Mid   Full                                                             +---------+---------------+---------+-----------+----------+-------------------+ FV DistalFull                                                             +---------+---------------+---------+-----------+----------+-------------------+ PFV      Full                                                             +---------+---------------+---------+-----------+----------+-------------------+ POP      Full                                         pulsatile waveforms +---------+---------------+---------+-----------+----------+-------------------+ PTV      Full                                                              +---------+---------------+---------+-----------+----------+-------------------+  PERO     Full                                                             +---------+---------------+---------+-----------+----------+-------------------+   +---------+---------------+---------+-----------+----------+-------------------+ LEFT     CompressibilityPhasicitySpontaneityPropertiesThrombus Aging      +---------+---------------+---------+-----------+----------+-------------------+ CFV      Full                                         pulsatile waveforms +---------+---------------+---------+-----------+----------+-------------------+ SFJ      Full                                                             +---------+---------------+---------+-----------+----------+-------------------+ FV Prox  Full                                                             +---------+---------------+---------+-----------+----------+-------------------+ FV Mid   Full                                                             +---------+---------------+---------+-----------+----------+-------------------+ FV DistalFull                                                             +---------+---------------+---------+-----------+----------+-------------------+ PFV      Full                                                             +---------+---------------+---------+-----------+----------+-------------------+ POP                                                   pulsatile waveforms +---------+---------------+---------+-----------+----------+-------------------+ PTV      Full                                                             +---------+---------------+---------+-----------+----------+-------------------+ PERO     Full                                                              +---------+---------------+---------+-----------+----------+-------------------+  Summary: RIGHT: - There is no evidence of deep vein thrombosis in the lower extremity.  Pulsatile waveforms suggestive of fluid overload  LEFT: - There is no evidence of deep vein thrombosis in the lower extremity.  Pulsatile waveforms suggestive of fluid overload.  *See table(s) above for measurements and observations. Electronically signed by Monica Martinez MD on 05/05/2021 at 3:35:10 PM.    Final    VAS [place] UPPER EXTREMITY VENOUS DUPLEX  Result Date: 05/05/2021 UPPER VENOUS STUDY  Patient Name:  LATARSHIA JERSEY  Date of Exam:   05/05/2021 Medical Rec #: [id number]      Accession #:    [id number] Date of Birth: 1940-11-03      Patient Gender: F Patient Age:   35 years Exam Location:  [facility] Procedure:      VAS [place] UPPER EXTREMITY VENOUS DUPLEX Referring Phys: Loree Fee HARRIS --------------------------------------------------------------------------------  Indications: Edema Risk Factors: COPD, GI Bleed. Limitations: Poor ultrasound/tissue interface and edema. Comparison Study: No prior study on file Performing Technologist: Sharion Dove RVS  Examination Guidelines: A complete evaluation includes B-mode imaging, spectral Doppler, color Doppler, and power Doppler as needed of all accessible portions of each vessel. Bilateral testing is considered an integral part of a complete examination. Limited examinations for reoccurring indications may be performed as noted.  Right Findings: +----------+------------+---------+-----------+----------+-------------------+ RIGHT     CompressiblePhasicitySpontaneousProperties      Summary       +----------+------------+---------+-----------+----------+-------------------+ IJV           Full                                  pulsatile waveforms +----------+------------+---------+-----------+----------+-------------------+ Subclavian                                           pulsatile waveforms +----------+------------+---------+-----------+----------+-------------------+ Axillary                                            pulsatile waveforms +----------+------------+---------+-----------+----------+-------------------+ Brachial      Full                                  pulsatile waveforms +----------+------------+---------+-----------+----------+-------------------+ Radial        Full                                                      +----------+------------+---------+-----------+----------+-------------------+ Ulnar                                                 patent by color   +----------+------------+---------+-----------+----------+-------------------+ Cephalic    Partial                                  Age Indeterminate  +----------+------------+---------+-----------+----------+-------------------+ Basilic       Full                                                      +----------+------------+---------+-----------+----------+-------------------+  Left Findings: +----------+------------+---------+-----------+----------+-------------------+ LEFT      CompressiblePhasicitySpontaneousProperties      Summary       +----------+------------+---------+-----------+----------+-------------------+ Subclavian                                          pulsatile waveforms +----------+------------+---------+-----------+----------+-------------------+  Summary:  Right: No evidence of deep vein thrombosis in the upper extremity. Findings consistent with non occlusive, age indeterminate superficial vein thrombosis involving a small segment of the right cephalic vein at the distal upper arm. Waveforms are pulsatile suggestive of fluid overload.  Left: No evidence of thrombosis in the subclavian.  *See table(s) above for measurements and observations.  Diagnosing physician: Monica Martinez MD Electronically signed by Monica Martinez MD on 05/05/2021 at 3:35:36 PM.    Final    DG ESOPHAGUS W SINGLE CM (SOL OR THIN BA)  Result Date: 05/07/2021 CLINICAL DATA:  Difficulty swallowing. EXAM: ESOPHOGRAM/BARIUM SWALLOW TECHNIQUE: Single contrast examination was performed using  thin barium. FLUOROSCOPY TIME:  Fluoroscopy Time:  1 minute 30 seconds Radiation Exposure Index (if provided by the fluoroscopic device): Number of Acquired Spot Images: None. COMPARISON:  CT chest 05/05/2021. FINDINGS: A limited study was done due to patient condition. Poor esophageal motility with tertiary contractions. Esophageal fold thickening. There is narrowing at the gastroesophageal junction, through which a 13 mm barium tablet would not pass. IMPRESSION: 1. Stricture at the gastroesophageal junction, through which a 13 mm barium pill would not pass. 2. Esophageal dysmotility. 3. Esophageal fold thickening. Electronically Signed   By: Lorin Picket M.D.   On: 05/07/2021 09:53    DISCHARGE EXAMINATION: Vitals:   05/10/21 0400 05/10/21 0756 05/10/21 0844 05/10/21 1205  BP: (!) 126/56 127/63 (!) 124/56 (!) 122/57  Pulse: 68 90 79 78  Resp: 18 20 15 20   Temp: 98 F (36.7 C) 97.6 F (36.4 C)  97.7 F (36.5 C)  TempSrc: Axillary Oral  Oral  SpO2: 93% 95% 96% 93%  Weight:       General appearance: Awake alert.  In no distress Resp: Clear to auscultation bilaterally.  Normal effort Cardio: S1-S2 is normal regular.  No S3-S4.  No rubs murmurs or bruit GI: Abdomen is soft.  Nontender nondistended.  Bowel sounds are present normal.  No masses organomegaly    DISPOSITION: Home  Discharge Instructions     AMB Referral to Community Care Coordinaton   Complete by: As directed    Central Team:  RN CCM disease management support high risk score and LLOS  PCP: [name], MD [place]   Please assign to [place] Coordinator for complex care and disease management follow up calls and assess for further needs.  Questions  please call:   Natividad Brood, RN BSN [place] Liaison  [phone] business mobile phone Toll free office [phone]  Fax number: [phone] [email or web address]   Reason for Referral: [facility] Disease Management ([employer] payers)   Disease managment services needed: Nurse Case Manager   Diagnoses of:  Other COPD/ Pneumonia     Other Diagnosis: GIB   Expected date of contact: Emergent - 3 Days   Call MD for:  difficulty breathing, headache or visual disturbances   Complete by: As directed    Call MD for:  extreme fatigue   Complete by: As directed    Call MD for:  persistant dizziness or light-headedness  Complete by: As directed    Call MD for:  persistant nausea and vomiting   Complete by: As directed    Call MD for:  severe uncontrolled pain   Complete by: As directed    Call MD for:  temperature >100.4   Complete by: As directed    Discharge instructions   Complete by: As directed    Take your medications as prescribed.  Be sure to follow-up with your primary care provider in 1 week.  You were cared for by a hospitalist during your hospital stay. If you have any questions about your discharge medications or the care you received while you were in the hospital after you are discharged, you can call the unit and asked to speak with the hospitalist on call if the hospitalist that took care of you is not available. Once you are discharged, your primary care physician will handle any further medical issues. Please note that NO REFILLS for any discharge medications will be authorized once you are discharged, as it is imperative that you return to your primary care physician (or establish a relationship with a primary care physician if you do not have one) for your aftercare needs so that they can reassess your need for medications and monitor your lab values. If you do not have a primary care physician, you can call  [phone] for a physician referral.   Increase activity slowly   Complete by: As directed           Allergies as of 05/10/2021       Reactions   Morphine And Related Itching   Abaloparatide Other (See Comments)   Aspirin Other (See Comments)   Duloxetine Hcl Other (See Comments)   Ventolin [albuterol]    Other reaction(s): rapid heart beat   Cefadroxil Hives   Patient can take amoxicillin and cipro        Medication List     STOP taking these medications    ALPRAZolam 0.25 MG tablet Commonly known as: XANAX   DULoxetine 20 MG capsule Commonly known as: CYMBALTA   nitrofurantoin (macrocrystal-monohydrate) 100 MG capsule Commonly known as: MACROBID   omeprazole 20 MG capsule Commonly known as: PRILOSEC   sulfamethoxazole-trimethoprim 800-160 MG tablet Commonly known as: BACTRIM DS       TAKE these medications    acetaminophen 500 MG tablet Commonly known as: TYLENOL Take 1,000 mg by mouth daily as needed for headache.   albuterol 108 (90 Base) MCG/ACT inhaler Commonly known as: VENTOLIN HFA Inhale 2 puffs into the lungs daily as needed. For seasonal allergies . Use 2 puffs 3 times daily x 4 days then back to home regimen. What changed:  reasons to take this additional instructions   dorzolamide-timolol 22.3-6.8 MG/ML ophthalmic solution Commonly known as: COSOPT Place 1 drop into both eyes 2 (two) times daily.   fluconazole 100 MG tablet Commonly known as: DIFLUCAN Take 1 tablet (100 mg total) by mouth daily for 10 days.   HYDROcodone-acetaminophen 5-325 MG tablet Commonly known as: NORCO/VICODIN Take 1 tablet by mouth every 6 (six) hours as needed for moderate pain.   latanoprost 0.005 % ophthalmic solution Commonly known as: XALATAN Place 1 drop into both eyes at bedtime.   loratadine 10 MG tablet Commonly known as: CLARITIN Take 1 tablet (10 mg total) by mouth daily.   multivitamin with minerals Tabs tablet Take 1 tablet by mouth  daily.   OXYGEN 2lpm with sleep only  [employer]   pantoprazole 40  MG tablet Commonly known as: PROTONIX Take 1 tablet (40 mg total) by mouth 2 (two) times daily.   predniSONE 10 MG tablet Commonly known as: DELTASONE Take 2 tablets daily for 3 days and then take 1 tablet daily for 3 days, then stop.   propranolol 20 MG tablet Commonly known as: INDERAL Take 20 mg by mouth See admin instructions. 20 mg qd  20 mg  as needed for heartracing   rOPINIRole 0.5 MG tablet Commonly known as: REQUIP Take 1 tablet (0.5 mg total) by mouth at bedtime. What changed:  when to take this reasons to take this   sertraline 100 MG tablet Commonly known as: ZOLOFT Take 1 tablet (100 mg total) by mouth daily. What changed: how much to take   simvastatin 20 MG tablet Commonly known as: ZOCOR Take 20 mg by mouth daily at 6 PM.   umeclidinium-vilanterol 62.5-25 MCG/ACT Aepb Commonly known as: ANORO ELLIPTA Inhale 1 puff into the lungs daily.   vitamin C 500 MG tablet Commonly known as: ASCORBIC ACID Take 500 mg by mouth daily.   VITAMIN D3 PO Take 1 tablet by mouth daily.          Follow-up Information     [facility] [place] Follow up.   Specialty: Home Health Services Contact information: [place] [address] [phone]         [name], MD. Schedule an appointment as soon as possible for a visit in 1 week(s).   Specialty: Internal Medicine Contact information: [address] [place] [address] [phone]                 TOTAL DISCHARGE TIME: 62 minutes  [place]  [facility] Pager on [email or web address]  05/11/2021, 10:38 AM

## 2021-05-10 NOTE — Progress Notes (Signed)
   05/10/21 1509  AVS Discharge Documentation  AVS Discharge Instructions Including Medications Provided to patient/caregiver;Placed in discharge packet for receiving facility  Name of Person Receiving AVS Discharge Instructions Including Medications [name]  Name of Clinician That Reviewed AVS Discharge Instructions Including Medications Venida Jarvis, RN

## 2021-05-10 NOTE — TOC Transition Note (Addendum)
Transition of Care [facility]) - CM/SW Discharge Note   Patient Details  Name: [name] MRN: [id number] Date of Birth: 1940/11/19  [facility]) CM/SW Contact:  Cyndi Bender, RN Phone Number: 05/10/2021, 11:38 AM   Clinical Narrative:     Patient stable for discharge.   Physical therapy recommend transport chair and 3&1 Patient was active with [facility] prior to admission to hospital for  HH-PT/OT. Marland Kitchen Notified Stacie with [facility] of discharge.    Patient has home 02 with [employer] and only uses oxygen at night.   Patient states she has a transport chair and that her bathroom is close enough to the bed she doesn't need 3&1. Spoke to husband and he is able to provide transportation home. He stated Visiting Prudencio Pair will be at the home to help him get patient in the house.   Husband says he can afford her medications.  Address, Phone number and PCP verified.   Final next level of care: [place] Barriers to Discharge: Barriers Resolved   Patient Goals and CMS Choice Patient states their goals for this hospitalization and ongoing recovery are:: to return to home CMS Medicare.gov Compare Post Acute Care list provided to:: Patient Choice offered to / list presented to : Patient  Discharge Placement               home        Discharge Plan and Services   Discharge Planning Services: CM Consult Post Acute Care Choice: Home Health          DME Arranged:  (patient has transport chair and refuses 3&1) DME Agency: NA       HH Arranged: OT, PT HH Agency: [place] Date [place]: 05/10/21 Time [place]: 1137 Representative spoke with at [place]: [place] ([place]) Interventions     Readmission Risk Interventions Readmission Risk Prevention Plan 05/10/2021  Transportation Screening Complete  PCP or Specialist Appt within 3-5 Days Complete  HRI or [place]  Complete  Social Work Consult for [place] Planning/Counseling Patient refused  Palliative Care Screening Not Applicable  Medication Review Press photographer) Complete  Some recent data might be hidden

## 2021-05-11 ENCOUNTER — Other Ambulatory Visit: Payer: Self-pay | Admitting: *Deleted

## 2021-05-11 NOTE — Patient Outreach (Signed)
[place] [facility]) Care Management  05/11/2021  MICHEAL SHEEN 1941-06-06 [id number]   Referral Date: 11/11 Referral Source: Hospital liaison Referral Reason: Recent hospital discharge Insurance: [employer] attempt #1, unsuccessful to both listed home and mobile numbers, HIPAA compliant voice messages left.  Noted that both numbers are the same for husband.  Plan: RN CM will send outreach letter and follow up within the next 3-4 business days.  Valente David, [place], MSN [place] [phone]

## 2021-05-12 DIAGNOSIS — R69 Illness, unspecified: Secondary | ICD-10-CM | POA: Diagnosis not present

## 2021-05-12 DIAGNOSIS — G2581 Restless legs syndrome: Secondary | ICD-10-CM | POA: Diagnosis not present

## 2021-05-12 DIAGNOSIS — K922 Gastrointestinal hemorrhage, unspecified: Secondary | ICD-10-CM | POA: Diagnosis not present

## 2021-05-12 DIAGNOSIS — E876 Hypokalemia: Secondary | ICD-10-CM | POA: Diagnosis not present

## 2021-05-12 DIAGNOSIS — D649 Anemia, unspecified: Secondary | ICD-10-CM | POA: Diagnosis not present

## 2021-05-12 DIAGNOSIS — M159 Polyosteoarthritis, unspecified: Secondary | ICD-10-CM | POA: Diagnosis not present

## 2021-05-12 DIAGNOSIS — H409 Unspecified glaucoma: Secondary | ICD-10-CM | POA: Diagnosis not present

## 2021-05-12 DIAGNOSIS — J449 Chronic obstructive pulmonary disease, unspecified: Secondary | ICD-10-CM | POA: Diagnosis not present

## 2021-05-12 DIAGNOSIS — H3530 Unspecified macular degeneration: Secondary | ICD-10-CM | POA: Diagnosis not present

## 2021-05-12 DIAGNOSIS — E871 Hypo-osmolality and hyponatremia: Secondary | ICD-10-CM | POA: Diagnosis not present

## 2021-05-14 ENCOUNTER — Other Ambulatory Visit: Payer: Self-pay | Admitting: *Deleted

## 2021-05-14 NOTE — Patient Outreach (Signed)
[place] [facility]) Care Management  05/14/2021  LEENAH SEIDNER 08/28/1940 [id number]   Referral Date: 11/11 Referral Source: Hospital liaison Referral Reason: Recent hospital discharge Insurance: [facility] attempt #2 successful after incoming call from husband.  Identity verified.  This care manager introduced self and stated purpose of call.  [facility] care management services explained.    He report having [facility] involved in care, difference between [facility] and [facility] explained.  He then state member's PCP office is also helping to manage member's care but will take down this care manager's information if help is needed.  Advised that letter and brochure was mailed to the home on Friday, encouraged to review and consider engagement, he agrees.   Member also noted to have an [place] Discharge alert on 11/12 for not scheduling follow up appointment.  She was discharged over a holiday weekend, husband will call to schedule.    Plan: RN CM will follow up within the next 2 weeks on decision on engagement.    Valente David, [place], MSN [place] [phone]

## 2021-05-16 ENCOUNTER — Ambulatory Visit: Payer: Self-pay | Admitting: *Deleted

## 2021-05-16 DIAGNOSIS — E871 Hypo-osmolality and hyponatremia: Secondary | ICD-10-CM | POA: Diagnosis not present

## 2021-05-16 DIAGNOSIS — E876 Hypokalemia: Secondary | ICD-10-CM | POA: Diagnosis not present

## 2021-05-16 DIAGNOSIS — G2581 Restless legs syndrome: Secondary | ICD-10-CM | POA: Diagnosis not present

## 2021-05-16 DIAGNOSIS — R69 Illness, unspecified: Secondary | ICD-10-CM | POA: Diagnosis not present

## 2021-05-16 DIAGNOSIS — M159 Polyosteoarthritis, unspecified: Secondary | ICD-10-CM | POA: Diagnosis not present

## 2021-05-16 DIAGNOSIS — H3530 Unspecified macular degeneration: Secondary | ICD-10-CM | POA: Diagnosis not present

## 2021-05-16 DIAGNOSIS — J449 Chronic obstructive pulmonary disease, unspecified: Secondary | ICD-10-CM | POA: Diagnosis not present

## 2021-05-16 DIAGNOSIS — D649 Anemia, unspecified: Secondary | ICD-10-CM | POA: Diagnosis not present

## 2021-05-16 DIAGNOSIS — K922 Gastrointestinal hemorrhage, unspecified: Secondary | ICD-10-CM | POA: Diagnosis not present

## 2021-05-16 DIAGNOSIS — H409 Unspecified glaucoma: Secondary | ICD-10-CM | POA: Diagnosis not present

## 2021-05-18 DIAGNOSIS — K922 Gastrointestinal hemorrhage, unspecified: Secondary | ICD-10-CM | POA: Diagnosis not present

## 2021-05-18 DIAGNOSIS — M159 Polyosteoarthritis, unspecified: Secondary | ICD-10-CM | POA: Diagnosis not present

## 2021-05-18 DIAGNOSIS — H3530 Unspecified macular degeneration: Secondary | ICD-10-CM | POA: Diagnosis not present

## 2021-05-18 DIAGNOSIS — E871 Hypo-osmolality and hyponatremia: Secondary | ICD-10-CM | POA: Diagnosis not present

## 2021-05-18 DIAGNOSIS — D649 Anemia, unspecified: Secondary | ICD-10-CM | POA: Diagnosis not present

## 2021-05-18 DIAGNOSIS — H409 Unspecified glaucoma: Secondary | ICD-10-CM | POA: Diagnosis not present

## 2021-05-18 DIAGNOSIS — G2581 Restless legs syndrome: Secondary | ICD-10-CM | POA: Diagnosis not present

## 2021-05-18 DIAGNOSIS — J449 Chronic obstructive pulmonary disease, unspecified: Secondary | ICD-10-CM | POA: Diagnosis not present

## 2021-05-18 DIAGNOSIS — E876 Hypokalemia: Secondary | ICD-10-CM | POA: Diagnosis not present

## 2021-05-18 DIAGNOSIS — R69 Illness, unspecified: Secondary | ICD-10-CM | POA: Diagnosis not present

## 2021-05-21 DIAGNOSIS — G2581 Restless legs syndrome: Secondary | ICD-10-CM | POA: Diagnosis not present

## 2021-05-21 DIAGNOSIS — K922 Gastrointestinal hemorrhage, unspecified: Secondary | ICD-10-CM | POA: Diagnosis not present

## 2021-05-21 DIAGNOSIS — H3530 Unspecified macular degeneration: Secondary | ICD-10-CM | POA: Diagnosis not present

## 2021-05-21 DIAGNOSIS — E871 Hypo-osmolality and hyponatremia: Secondary | ICD-10-CM | POA: Diagnosis not present

## 2021-05-21 DIAGNOSIS — M159 Polyosteoarthritis, unspecified: Secondary | ICD-10-CM | POA: Diagnosis not present

## 2021-05-21 DIAGNOSIS — H409 Unspecified glaucoma: Secondary | ICD-10-CM | POA: Diagnosis not present

## 2021-05-21 DIAGNOSIS — D649 Anemia, unspecified: Secondary | ICD-10-CM | POA: Diagnosis not present

## 2021-05-21 DIAGNOSIS — J449 Chronic obstructive pulmonary disease, unspecified: Secondary | ICD-10-CM | POA: Diagnosis not present

## 2021-05-21 DIAGNOSIS — E876 Hypokalemia: Secondary | ICD-10-CM | POA: Diagnosis not present

## 2021-05-21 DIAGNOSIS — R69 Illness, unspecified: Secondary | ICD-10-CM | POA: Diagnosis not present

## 2021-05-22 DIAGNOSIS — K625 Hemorrhage of anus and rectum: Secondary | ICD-10-CM | POA: Diagnosis not present

## 2021-05-22 DIAGNOSIS — J449 Chronic obstructive pulmonary disease, unspecified: Secondary | ICD-10-CM | POA: Diagnosis not present

## 2021-05-22 DIAGNOSIS — K5900 Constipation, unspecified: Secondary | ICD-10-CM | POA: Diagnosis not present

## 2021-05-22 DIAGNOSIS — M810 Age-related osteoporosis without current pathological fracture: Secondary | ICD-10-CM | POA: Diagnosis not present

## 2021-05-22 DIAGNOSIS — R7301 Impaired fasting glucose: Secondary | ICD-10-CM | POA: Diagnosis not present

## 2021-05-22 DIAGNOSIS — R911 Solitary pulmonary nodule: Secondary | ICD-10-CM | POA: Diagnosis not present

## 2021-05-22 DIAGNOSIS — Z23 Encounter for immunization: Secondary | ICD-10-CM | POA: Diagnosis not present

## 2021-05-22 DIAGNOSIS — D62 Acute posthemorrhagic anemia: Secondary | ICD-10-CM | POA: Diagnosis not present

## 2021-05-22 DIAGNOSIS — K219 Gastro-esophageal reflux disease without esophagitis: Secondary | ICD-10-CM | POA: Diagnosis not present

## 2021-05-22 DIAGNOSIS — R69 Illness, unspecified: Secondary | ICD-10-CM | POA: Diagnosis not present

## 2021-05-23 ENCOUNTER — Other Ambulatory Visit: Payer: Self-pay | Admitting: Internal Medicine

## 2021-05-23 DIAGNOSIS — M159 Polyosteoarthritis, unspecified: Secondary | ICD-10-CM | POA: Diagnosis not present

## 2021-05-23 DIAGNOSIS — K922 Gastrointestinal hemorrhage, unspecified: Secondary | ICD-10-CM | POA: Diagnosis not present

## 2021-05-23 DIAGNOSIS — E871 Hypo-osmolality and hyponatremia: Secondary | ICD-10-CM | POA: Diagnosis not present

## 2021-05-23 DIAGNOSIS — J449 Chronic obstructive pulmonary disease, unspecified: Secondary | ICD-10-CM | POA: Diagnosis not present

## 2021-05-23 DIAGNOSIS — H409 Unspecified glaucoma: Secondary | ICD-10-CM | POA: Diagnosis not present

## 2021-05-23 DIAGNOSIS — G2581 Restless legs syndrome: Secondary | ICD-10-CM | POA: Diagnosis not present

## 2021-05-23 DIAGNOSIS — R69 Illness, unspecified: Secondary | ICD-10-CM | POA: Diagnosis not present

## 2021-05-23 DIAGNOSIS — H3530 Unspecified macular degeneration: Secondary | ICD-10-CM | POA: Diagnosis not present

## 2021-05-23 DIAGNOSIS — E876 Hypokalemia: Secondary | ICD-10-CM | POA: Diagnosis not present

## 2021-05-23 DIAGNOSIS — R911 Solitary pulmonary nodule: Secondary | ICD-10-CM

## 2021-05-23 DIAGNOSIS — D649 Anemia, unspecified: Secondary | ICD-10-CM | POA: Diagnosis not present

## 2021-05-28 DIAGNOSIS — J449 Chronic obstructive pulmonary disease, unspecified: Secondary | ICD-10-CM | POA: Diagnosis not present

## 2021-05-28 DIAGNOSIS — H8110 Benign paroxysmal vertigo, unspecified ear: Secondary | ICD-10-CM | POA: Diagnosis not present

## 2021-05-28 DIAGNOSIS — K922 Gastrointestinal hemorrhage, unspecified: Secondary | ICD-10-CM | POA: Diagnosis not present

## 2021-05-28 DIAGNOSIS — H3530 Unspecified macular degeneration: Secondary | ICD-10-CM | POA: Diagnosis not present

## 2021-05-28 DIAGNOSIS — H409 Unspecified glaucoma: Secondary | ICD-10-CM | POA: Diagnosis not present

## 2021-05-28 DIAGNOSIS — R69 Illness, unspecified: Secondary | ICD-10-CM | POA: Diagnosis not present

## 2021-05-28 DIAGNOSIS — M8580 Other specified disorders of bone density and structure, unspecified site: Secondary | ICD-10-CM | POA: Diagnosis not present

## 2021-05-28 DIAGNOSIS — G2581 Restless legs syndrome: Secondary | ICD-10-CM | POA: Diagnosis not present

## 2021-05-28 DIAGNOSIS — M150 Primary generalized (osteo)arthritis: Secondary | ICD-10-CM | POA: Diagnosis not present

## 2021-05-28 DIAGNOSIS — D62 Acute posthemorrhagic anemia: Secondary | ICD-10-CM | POA: Diagnosis not present

## 2021-05-30 ENCOUNTER — Other Ambulatory Visit: Payer: Self-pay | Admitting: *Deleted

## 2021-05-30 DIAGNOSIS — R69 Illness, unspecified: Secondary | ICD-10-CM | POA: Diagnosis not present

## 2021-05-30 DIAGNOSIS — D62 Acute posthemorrhagic anemia: Secondary | ICD-10-CM | POA: Diagnosis not present

## 2021-05-30 DIAGNOSIS — J449 Chronic obstructive pulmonary disease, unspecified: Secondary | ICD-10-CM | POA: Diagnosis not present

## 2021-05-30 DIAGNOSIS — H409 Unspecified glaucoma: Secondary | ICD-10-CM | POA: Diagnosis not present

## 2021-05-30 DIAGNOSIS — H8110 Benign paroxysmal vertigo, unspecified ear: Secondary | ICD-10-CM | POA: Diagnosis not present

## 2021-05-30 DIAGNOSIS — G2581 Restless legs syndrome: Secondary | ICD-10-CM | POA: Diagnosis not present

## 2021-05-30 DIAGNOSIS — H3530 Unspecified macular degeneration: Secondary | ICD-10-CM | POA: Diagnosis not present

## 2021-05-30 DIAGNOSIS — M8580 Other specified disorders of bone density and structure, unspecified site: Secondary | ICD-10-CM | POA: Diagnosis not present

## 2021-05-30 DIAGNOSIS — M150 Primary generalized (osteo)arthritis: Secondary | ICD-10-CM | POA: Diagnosis not present

## 2021-05-30 DIAGNOSIS — K922 Gastrointestinal hemorrhage, unspecified: Secondary | ICD-10-CM | POA: Diagnosis not present

## 2021-05-30 NOTE — Patient Outreach (Signed)
[place] [facility]) Care Management  05/30/2021  MAYMUNAH STEGEMANN 06/23/1941 [id number]   Outgoing call placed to member/husband to follow up on decision for participation.  He again state they have no needs, feels they have all support needed through home health and PCP office.  State he has this care manager's contact information and will call if needs changed.  Will close case at this time.  Valente David, [place], MSN [place] [phone]

## 2021-06-04 ENCOUNTER — Other Ambulatory Visit (HOSPITAL_COMMUNITY): Payer: Self-pay | Admitting: *Deleted

## 2021-06-04 DIAGNOSIS — H409 Unspecified glaucoma: Secondary | ICD-10-CM | POA: Diagnosis not present

## 2021-06-04 DIAGNOSIS — H8110 Benign paroxysmal vertigo, unspecified ear: Secondary | ICD-10-CM | POA: Diagnosis not present

## 2021-06-04 DIAGNOSIS — K922 Gastrointestinal hemorrhage, unspecified: Secondary | ICD-10-CM | POA: Diagnosis not present

## 2021-06-04 DIAGNOSIS — J449 Chronic obstructive pulmonary disease, unspecified: Secondary | ICD-10-CM | POA: Diagnosis not present

## 2021-06-04 DIAGNOSIS — D62 Acute posthemorrhagic anemia: Secondary | ICD-10-CM | POA: Diagnosis not present

## 2021-06-04 DIAGNOSIS — H3530 Unspecified macular degeneration: Secondary | ICD-10-CM | POA: Diagnosis not present

## 2021-06-04 DIAGNOSIS — M150 Primary generalized (osteo)arthritis: Secondary | ICD-10-CM | POA: Diagnosis not present

## 2021-06-04 DIAGNOSIS — G2581 Restless legs syndrome: Secondary | ICD-10-CM | POA: Diagnosis not present

## 2021-06-04 DIAGNOSIS — R69 Illness, unspecified: Secondary | ICD-10-CM | POA: Diagnosis not present

## 2021-06-04 DIAGNOSIS — M8580 Other specified disorders of bone density and structure, unspecified site: Secondary | ICD-10-CM | POA: Diagnosis not present

## 2021-06-05 ENCOUNTER — Encounter (HOSPITAL_COMMUNITY): Payer: Self-pay

## 2021-06-05 ENCOUNTER — Inpatient Hospital Stay (HOSPITAL_COMMUNITY): Admission: RE | Admit: 2021-06-05 | Payer: Medicare HMO | Source: Ambulatory Visit

## 2021-06-05 DIAGNOSIS — R0609 Other forms of dyspnea: Secondary | ICD-10-CM | POA: Diagnosis not present

## 2021-06-05 DIAGNOSIS — J449 Chronic obstructive pulmonary disease, unspecified: Secondary | ICD-10-CM | POA: Diagnosis not present

## 2021-06-05 DIAGNOSIS — R0901 Asphyxia: Secondary | ICD-10-CM | POA: Diagnosis not present

## 2021-06-07 DIAGNOSIS — H8110 Benign paroxysmal vertigo, unspecified ear: Secondary | ICD-10-CM | POA: Diagnosis not present

## 2021-06-07 DIAGNOSIS — D62 Acute posthemorrhagic anemia: Secondary | ICD-10-CM | POA: Diagnosis not present

## 2021-06-07 DIAGNOSIS — M150 Primary generalized (osteo)arthritis: Secondary | ICD-10-CM | POA: Diagnosis not present

## 2021-06-07 DIAGNOSIS — J449 Chronic obstructive pulmonary disease, unspecified: Secondary | ICD-10-CM | POA: Diagnosis not present

## 2021-06-07 DIAGNOSIS — R69 Illness, unspecified: Secondary | ICD-10-CM | POA: Diagnosis not present

## 2021-06-07 DIAGNOSIS — M8580 Other specified disorders of bone density and structure, unspecified site: Secondary | ICD-10-CM | POA: Diagnosis not present

## 2021-06-07 DIAGNOSIS — G2581 Restless legs syndrome: Secondary | ICD-10-CM | POA: Diagnosis not present

## 2021-06-07 DIAGNOSIS — H409 Unspecified glaucoma: Secondary | ICD-10-CM | POA: Diagnosis not present

## 2021-06-07 DIAGNOSIS — K922 Gastrointestinal hemorrhage, unspecified: Secondary | ICD-10-CM | POA: Diagnosis not present

## 2021-06-07 DIAGNOSIS — H3530 Unspecified macular degeneration: Secondary | ICD-10-CM | POA: Diagnosis not present

## 2021-06-11 DIAGNOSIS — G2581 Restless legs syndrome: Secondary | ICD-10-CM | POA: Diagnosis not present

## 2021-06-11 DIAGNOSIS — R69 Illness, unspecified: Secondary | ICD-10-CM | POA: Diagnosis not present

## 2021-06-11 DIAGNOSIS — M8580 Other specified disorders of bone density and structure, unspecified site: Secondary | ICD-10-CM | POA: Diagnosis not present

## 2021-06-11 DIAGNOSIS — H8110 Benign paroxysmal vertigo, unspecified ear: Secondary | ICD-10-CM | POA: Diagnosis not present

## 2021-06-11 DIAGNOSIS — J449 Chronic obstructive pulmonary disease, unspecified: Secondary | ICD-10-CM | POA: Diagnosis not present

## 2021-06-11 DIAGNOSIS — K922 Gastrointestinal hemorrhage, unspecified: Secondary | ICD-10-CM | POA: Diagnosis not present

## 2021-06-11 DIAGNOSIS — D62 Acute posthemorrhagic anemia: Secondary | ICD-10-CM | POA: Diagnosis not present

## 2021-06-11 DIAGNOSIS — H409 Unspecified glaucoma: Secondary | ICD-10-CM | POA: Diagnosis not present

## 2021-06-11 DIAGNOSIS — M150 Primary generalized (osteo)arthritis: Secondary | ICD-10-CM | POA: Diagnosis not present

## 2021-06-11 DIAGNOSIS — H3530 Unspecified macular degeneration: Secondary | ICD-10-CM | POA: Diagnosis not present

## 2021-06-13 DIAGNOSIS — K922 Gastrointestinal hemorrhage, unspecified: Secondary | ICD-10-CM | POA: Diagnosis not present

## 2021-06-13 DIAGNOSIS — H409 Unspecified glaucoma: Secondary | ICD-10-CM | POA: Diagnosis not present

## 2021-06-13 DIAGNOSIS — R69 Illness, unspecified: Secondary | ICD-10-CM | POA: Diagnosis not present

## 2021-06-13 DIAGNOSIS — D62 Acute posthemorrhagic anemia: Secondary | ICD-10-CM | POA: Diagnosis not present

## 2021-06-13 DIAGNOSIS — G2581 Restless legs syndrome: Secondary | ICD-10-CM | POA: Diagnosis not present

## 2021-06-13 DIAGNOSIS — J449 Chronic obstructive pulmonary disease, unspecified: Secondary | ICD-10-CM | POA: Diagnosis not present

## 2021-06-13 DIAGNOSIS — M150 Primary generalized (osteo)arthritis: Secondary | ICD-10-CM | POA: Diagnosis not present

## 2021-06-13 DIAGNOSIS — H8110 Benign paroxysmal vertigo, unspecified ear: Secondary | ICD-10-CM | POA: Diagnosis not present

## 2021-06-13 DIAGNOSIS — M8580 Other specified disorders of bone density and structure, unspecified site: Secondary | ICD-10-CM | POA: Diagnosis not present

## 2021-06-13 DIAGNOSIS — H3530 Unspecified macular degeneration: Secondary | ICD-10-CM | POA: Diagnosis not present

## 2021-06-18 DIAGNOSIS — D62 Acute posthemorrhagic anemia: Secondary | ICD-10-CM | POA: Diagnosis not present

## 2021-06-18 DIAGNOSIS — R69 Illness, unspecified: Secondary | ICD-10-CM | POA: Diagnosis not present

## 2021-06-18 DIAGNOSIS — H3530 Unspecified macular degeneration: Secondary | ICD-10-CM | POA: Diagnosis not present

## 2021-06-18 DIAGNOSIS — H8110 Benign paroxysmal vertigo, unspecified ear: Secondary | ICD-10-CM | POA: Diagnosis not present

## 2021-06-18 DIAGNOSIS — M150 Primary generalized (osteo)arthritis: Secondary | ICD-10-CM | POA: Diagnosis not present

## 2021-06-18 DIAGNOSIS — J449 Chronic obstructive pulmonary disease, unspecified: Secondary | ICD-10-CM | POA: Diagnosis not present

## 2021-06-18 DIAGNOSIS — G2581 Restless legs syndrome: Secondary | ICD-10-CM | POA: Diagnosis not present

## 2021-06-18 DIAGNOSIS — H409 Unspecified glaucoma: Secondary | ICD-10-CM | POA: Diagnosis not present

## 2021-06-18 DIAGNOSIS — K922 Gastrointestinal hemorrhage, unspecified: Secondary | ICD-10-CM | POA: Diagnosis not present

## 2021-06-18 DIAGNOSIS — M8580 Other specified disorders of bone density and structure, unspecified site: Secondary | ICD-10-CM | POA: Diagnosis not present

## 2021-06-19 DIAGNOSIS — J449 Chronic obstructive pulmonary disease, unspecified: Secondary | ICD-10-CM | POA: Diagnosis not present

## 2021-06-19 DIAGNOSIS — R69 Illness, unspecified: Secondary | ICD-10-CM | POA: Diagnosis not present

## 2021-06-19 DIAGNOSIS — H8110 Benign paroxysmal vertigo, unspecified ear: Secondary | ICD-10-CM | POA: Diagnosis not present

## 2021-06-19 DIAGNOSIS — H409 Unspecified glaucoma: Secondary | ICD-10-CM | POA: Diagnosis not present

## 2021-06-19 DIAGNOSIS — D62 Acute posthemorrhagic anemia: Secondary | ICD-10-CM | POA: Diagnosis not present

## 2021-06-19 DIAGNOSIS — M150 Primary generalized (osteo)arthritis: Secondary | ICD-10-CM | POA: Diagnosis not present

## 2021-06-19 DIAGNOSIS — K922 Gastrointestinal hemorrhage, unspecified: Secondary | ICD-10-CM | POA: Diagnosis not present

## 2021-06-19 DIAGNOSIS — G2581 Restless legs syndrome: Secondary | ICD-10-CM | POA: Diagnosis not present

## 2021-06-19 DIAGNOSIS — H3530 Unspecified macular degeneration: Secondary | ICD-10-CM | POA: Diagnosis not present

## 2021-06-19 DIAGNOSIS — M8580 Other specified disorders of bone density and structure, unspecified site: Secondary | ICD-10-CM | POA: Diagnosis not present

## 2021-06-29 DIAGNOSIS — M810 Age-related osteoporosis without current pathological fracture: Secondary | ICD-10-CM | POA: Diagnosis not present

## 2021-06-29 DIAGNOSIS — E785 Hyperlipidemia, unspecified: Secondary | ICD-10-CM | POA: Diagnosis not present

## 2021-06-29 DIAGNOSIS — K219 Gastro-esophageal reflux disease without esophagitis: Secondary | ICD-10-CM | POA: Diagnosis not present

## 2021-06-29 DIAGNOSIS — G2581 Restless legs syndrome: Secondary | ICD-10-CM | POA: Diagnosis not present

## 2021-07-06 DIAGNOSIS — R0901 Asphyxia: Secondary | ICD-10-CM | POA: Diagnosis not present

## 2021-07-06 DIAGNOSIS — R0609 Other forms of dyspnea: Secondary | ICD-10-CM | POA: Diagnosis not present

## 2021-07-06 DIAGNOSIS — J449 Chronic obstructive pulmonary disease, unspecified: Secondary | ICD-10-CM | POA: Diagnosis not present

## 2021-07-23 ENCOUNTER — Other Ambulatory Visit: Payer: Self-pay

## 2021-07-23 ENCOUNTER — Telehealth: Payer: Self-pay | Admitting: Internal Medicine

## 2021-07-23 ENCOUNTER — Other Ambulatory Visit (INDEPENDENT_AMBULATORY_CARE_PROVIDER_SITE_OTHER): Payer: Medicare HMO

## 2021-07-23 DIAGNOSIS — K922 Gastrointestinal hemorrhage, unspecified: Secondary | ICD-10-CM

## 2021-07-23 LAB — CBC WITH DIFFERENTIAL/PLATELET
Basophils Absolute: 0 10*3/uL (ref 0.0–0.1)
Basophils Relative: 0.8 % (ref 0.0–3.0)
Eosinophils Absolute: 0.2 10*3/uL (ref 0.0–0.7)
Eosinophils Relative: 3.1 % (ref 0.0–5.0)
HCT: 36.4 % (ref 36.0–46.0)
Hemoglobin: 11.7 g/dL — ABNORMAL LOW (ref 12.0–15.0)
Lymphocytes Relative: 26.3 % (ref 12.0–46.0)
Lymphs Abs: 1.4 10*3/uL (ref 0.7–4.0)
MCHC: 32.1 g/dL (ref 30.0–36.0)
MCV: 88.3 fl (ref 78.0–100.0)
Monocytes Absolute: 0.8 10*3/uL (ref 0.1–1.0)
Monocytes Relative: 14.5 % — ABNORMAL HIGH (ref 3.0–12.0)
Neutro Abs: 2.9 10*3/uL (ref 1.4–7.7)
Neutrophils Relative %: 55.3 % (ref 43.0–77.0)
Platelets: 154 10*3/uL (ref 150.0–400.0)
RBC: 4.12 Mil/uL (ref 3.87–5.11)
RDW: 22.5 % — ABNORMAL HIGH (ref 11.5–15.5)
WBC: 5.2 10*3/uL (ref 4.0–10.5)

## 2021-07-23 NOTE — Telephone Encounter (Signed)
Patients husband called and stated that patient has an appointment tomorrow with Dr. Henrene Pastor.  Sated that they got a voicemail to come in today to get labs done. They come in and lab did CBC's.  Wanting to inform Dr. Henrene Pastor. Please advise.

## 2021-07-23 NOTE — Telephone Encounter (Signed)
Pt showed up in the lab stating they needed blood work done prior to [employer]. Pt was seen in ED of lower GI bleed. Pt had CBC done in the lab.

## 2021-07-24 ENCOUNTER — Ambulatory Visit: Payer: Medicare HMO | Admitting: Internal Medicine

## 2021-08-06 DIAGNOSIS — R0609 Other forms of dyspnea: Secondary | ICD-10-CM | POA: Diagnosis not present

## 2021-08-06 DIAGNOSIS — J449 Chronic obstructive pulmonary disease, unspecified: Secondary | ICD-10-CM | POA: Diagnosis not present

## 2021-08-06 DIAGNOSIS — R0901 Asphyxia: Secondary | ICD-10-CM | POA: Diagnosis not present

## 2021-08-09 ENCOUNTER — Ambulatory Visit
Admission: RE | Admit: 2021-08-09 | Discharge: 2021-08-09 | Disposition: A | Discharge status: Home / Self Care | Payer: Medicare HMO | Source: Ambulatory Visit | Attending: Internal Medicine | Admitting: Internal Medicine

## 2021-08-09 DIAGNOSIS — R911 Solitary pulmonary nodule: Secondary | ICD-10-CM

## 2021-08-09 DIAGNOSIS — J439 Emphysema, unspecified: Secondary | ICD-10-CM | POA: Diagnosis not present

## 2021-08-09 DIAGNOSIS — I700 Atherosclerosis of aorta: Secondary | ICD-10-CM | POA: Diagnosis not present

## 2021-08-21 DIAGNOSIS — R269 Unspecified abnormalities of gait and mobility: Secondary | ICD-10-CM | POA: Diagnosis not present

## 2021-08-21 DIAGNOSIS — R2681 Unsteadiness on feet: Secondary | ICD-10-CM | POA: Diagnosis not present

## 2021-08-21 DIAGNOSIS — M6281 Muscle weakness (generalized): Secondary | ICD-10-CM | POA: Diagnosis not present

## 2021-08-21 DIAGNOSIS — J949 Pleural condition, unspecified: Secondary | ICD-10-CM | POA: Diagnosis not present

## 2021-08-21 DIAGNOSIS — R1312 Dysphagia, oropharyngeal phase: Secondary | ICD-10-CM | POA: Diagnosis not present

## 2021-08-21 DIAGNOSIS — Z9181 History of falling: Secondary | ICD-10-CM | POA: Diagnosis not present

## 2021-08-21 DIAGNOSIS — H3530 Unspecified macular degeneration: Secondary | ICD-10-CM | POA: Diagnosis not present

## 2021-08-21 DIAGNOSIS — R4789 Other speech disturbances: Secondary | ICD-10-CM | POA: Diagnosis not present

## 2021-08-22 DIAGNOSIS — R2681 Unsteadiness on feet: Secondary | ICD-10-CM | POA: Diagnosis not present

## 2021-08-22 DIAGNOSIS — H3530 Unspecified macular degeneration: Secondary | ICD-10-CM | POA: Diagnosis not present

## 2021-08-22 DIAGNOSIS — R4789 Other speech disturbances: Secondary | ICD-10-CM | POA: Diagnosis not present

## 2021-08-22 DIAGNOSIS — Z9181 History of falling: Secondary | ICD-10-CM | POA: Diagnosis not present

## 2021-08-22 DIAGNOSIS — R269 Unspecified abnormalities of gait and mobility: Secondary | ICD-10-CM | POA: Diagnosis not present

## 2021-08-22 DIAGNOSIS — M6281 Muscle weakness (generalized): Secondary | ICD-10-CM | POA: Diagnosis not present

## 2021-08-22 DIAGNOSIS — J949 Pleural condition, unspecified: Secondary | ICD-10-CM | POA: Diagnosis not present

## 2021-08-22 DIAGNOSIS — R1312 Dysphagia, oropharyngeal phase: Secondary | ICD-10-CM | POA: Diagnosis not present

## 2021-08-23 DIAGNOSIS — J949 Pleural condition, unspecified: Secondary | ICD-10-CM | POA: Diagnosis not present

## 2021-08-23 DIAGNOSIS — M6281 Muscle weakness (generalized): Secondary | ICD-10-CM | POA: Diagnosis not present

## 2021-08-23 DIAGNOSIS — R269 Unspecified abnormalities of gait and mobility: Secondary | ICD-10-CM | POA: Diagnosis not present

## 2021-08-23 DIAGNOSIS — R1312 Dysphagia, oropharyngeal phase: Secondary | ICD-10-CM | POA: Diagnosis not present

## 2021-08-23 DIAGNOSIS — R2681 Unsteadiness on feet: Secondary | ICD-10-CM | POA: Diagnosis not present

## 2021-08-23 DIAGNOSIS — H3530 Unspecified macular degeneration: Secondary | ICD-10-CM | POA: Diagnosis not present

## 2021-08-23 DIAGNOSIS — R4789 Other speech disturbances: Secondary | ICD-10-CM | POA: Diagnosis not present

## 2021-08-23 DIAGNOSIS — Z9181 History of falling: Secondary | ICD-10-CM | POA: Diagnosis not present

## 2021-08-24 DIAGNOSIS — H3530 Unspecified macular degeneration: Secondary | ICD-10-CM | POA: Diagnosis not present

## 2021-08-24 DIAGNOSIS — Z9181 History of falling: Secondary | ICD-10-CM | POA: Diagnosis not present

## 2021-08-24 DIAGNOSIS — R1312 Dysphagia, oropharyngeal phase: Secondary | ICD-10-CM | POA: Diagnosis not present

## 2021-08-24 DIAGNOSIS — R2681 Unsteadiness on feet: Secondary | ICD-10-CM | POA: Diagnosis not present

## 2021-08-24 DIAGNOSIS — R269 Unspecified abnormalities of gait and mobility: Secondary | ICD-10-CM | POA: Diagnosis not present

## 2021-08-24 DIAGNOSIS — J949 Pleural condition, unspecified: Secondary | ICD-10-CM | POA: Diagnosis not present

## 2021-08-24 DIAGNOSIS — R4789 Other speech disturbances: Secondary | ICD-10-CM | POA: Diagnosis not present

## 2021-08-24 DIAGNOSIS — M6281 Muscle weakness (generalized): Secondary | ICD-10-CM | POA: Diagnosis not present

## 2021-08-27 DIAGNOSIS — J949 Pleural condition, unspecified: Secondary | ICD-10-CM | POA: Diagnosis not present

## 2021-08-27 DIAGNOSIS — R269 Unspecified abnormalities of gait and mobility: Secondary | ICD-10-CM | POA: Diagnosis not present

## 2021-08-27 DIAGNOSIS — R1312 Dysphagia, oropharyngeal phase: Secondary | ICD-10-CM | POA: Diagnosis not present

## 2021-08-27 DIAGNOSIS — M6281 Muscle weakness (generalized): Secondary | ICD-10-CM | POA: Diagnosis not present

## 2021-08-27 DIAGNOSIS — H3530 Unspecified macular degeneration: Secondary | ICD-10-CM | POA: Diagnosis not present

## 2021-08-27 DIAGNOSIS — R4789 Other speech disturbances: Secondary | ICD-10-CM | POA: Diagnosis not present

## 2021-08-27 DIAGNOSIS — R2681 Unsteadiness on feet: Secondary | ICD-10-CM | POA: Diagnosis not present

## 2021-08-27 DIAGNOSIS — Z9181 History of falling: Secondary | ICD-10-CM | POA: Diagnosis not present

## 2021-08-28 DIAGNOSIS — H353131 Nonexudative age-related macular degeneration, bilateral, early dry stage: Secondary | ICD-10-CM | POA: Diagnosis not present

## 2021-08-28 DIAGNOSIS — R1312 Dysphagia, oropharyngeal phase: Secondary | ICD-10-CM | POA: Diagnosis not present

## 2021-08-28 DIAGNOSIS — M6281 Muscle weakness (generalized): Secondary | ICD-10-CM | POA: Diagnosis not present

## 2021-08-28 DIAGNOSIS — R4789 Other speech disturbances: Secondary | ICD-10-CM | POA: Diagnosis not present

## 2021-08-28 DIAGNOSIS — J949 Pleural condition, unspecified: Secondary | ICD-10-CM | POA: Diagnosis not present

## 2021-08-28 DIAGNOSIS — R2681 Unsteadiness on feet: Secondary | ICD-10-CM | POA: Diagnosis not present

## 2021-08-28 DIAGNOSIS — R269 Unspecified abnormalities of gait and mobility: Secondary | ICD-10-CM | POA: Diagnosis not present

## 2021-08-28 DIAGNOSIS — H3530 Unspecified macular degeneration: Secondary | ICD-10-CM | POA: Diagnosis not present

## 2021-08-28 DIAGNOSIS — Z9181 History of falling: Secondary | ICD-10-CM | POA: Diagnosis not present

## 2021-08-28 DIAGNOSIS — H401134 Primary open-angle glaucoma, bilateral, indeterminate stage: Secondary | ICD-10-CM | POA: Diagnosis not present

## 2021-08-29 DIAGNOSIS — R1312 Dysphagia, oropharyngeal phase: Secondary | ICD-10-CM | POA: Diagnosis not present

## 2021-08-29 DIAGNOSIS — J949 Pleural condition, unspecified: Secondary | ICD-10-CM | POA: Diagnosis not present

## 2021-08-29 DIAGNOSIS — H3530 Unspecified macular degeneration: Secondary | ICD-10-CM | POA: Diagnosis not present

## 2021-08-29 DIAGNOSIS — Z9181 History of falling: Secondary | ICD-10-CM | POA: Diagnosis not present

## 2021-08-29 DIAGNOSIS — M6281 Muscle weakness (generalized): Secondary | ICD-10-CM | POA: Diagnosis not present

## 2021-08-29 DIAGNOSIS — R4789 Other speech disturbances: Secondary | ICD-10-CM | POA: Diagnosis not present

## 2021-08-29 DIAGNOSIS — R269 Unspecified abnormalities of gait and mobility: Secondary | ICD-10-CM | POA: Diagnosis not present

## 2021-08-29 DIAGNOSIS — R2681 Unsteadiness on feet: Secondary | ICD-10-CM | POA: Diagnosis not present

## 2021-08-30 DIAGNOSIS — J949 Pleural condition, unspecified: Secondary | ICD-10-CM | POA: Diagnosis not present

## 2021-08-30 DIAGNOSIS — R4789 Other speech disturbances: Secondary | ICD-10-CM | POA: Diagnosis not present

## 2021-08-30 DIAGNOSIS — Z9181 History of falling: Secondary | ICD-10-CM | POA: Diagnosis not present

## 2021-08-30 DIAGNOSIS — H3530 Unspecified macular degeneration: Secondary | ICD-10-CM | POA: Diagnosis not present

## 2021-08-30 DIAGNOSIS — R2681 Unsteadiness on feet: Secondary | ICD-10-CM | POA: Diagnosis not present

## 2021-08-30 DIAGNOSIS — R1312 Dysphagia, oropharyngeal phase: Secondary | ICD-10-CM | POA: Diagnosis not present

## 2021-08-30 DIAGNOSIS — M6281 Muscle weakness (generalized): Secondary | ICD-10-CM | POA: Diagnosis not present

## 2021-08-30 DIAGNOSIS — R269 Unspecified abnormalities of gait and mobility: Secondary | ICD-10-CM | POA: Diagnosis not present

## 2021-08-31 DIAGNOSIS — R1312 Dysphagia, oropharyngeal phase: Secondary | ICD-10-CM | POA: Diagnosis not present

## 2021-08-31 DIAGNOSIS — R4789 Other speech disturbances: Secondary | ICD-10-CM | POA: Diagnosis not present

## 2021-08-31 DIAGNOSIS — R2681 Unsteadiness on feet: Secondary | ICD-10-CM | POA: Diagnosis not present

## 2021-08-31 DIAGNOSIS — Z9181 History of falling: Secondary | ICD-10-CM | POA: Diagnosis not present

## 2021-08-31 DIAGNOSIS — J949 Pleural condition, unspecified: Secondary | ICD-10-CM | POA: Diagnosis not present

## 2021-08-31 DIAGNOSIS — H3530 Unspecified macular degeneration: Secondary | ICD-10-CM | POA: Diagnosis not present

## 2021-08-31 DIAGNOSIS — M6281 Muscle weakness (generalized): Secondary | ICD-10-CM | POA: Diagnosis not present

## 2021-08-31 DIAGNOSIS — R269 Unspecified abnormalities of gait and mobility: Secondary | ICD-10-CM | POA: Diagnosis not present

## 2021-09-04 DIAGNOSIS — R4789 Other speech disturbances: Secondary | ICD-10-CM | POA: Diagnosis not present

## 2021-09-04 DIAGNOSIS — R2681 Unsteadiness on feet: Secondary | ICD-10-CM | POA: Diagnosis not present

## 2021-09-04 DIAGNOSIS — R1312 Dysphagia, oropharyngeal phase: Secondary | ICD-10-CM | POA: Diagnosis not present

## 2021-09-04 DIAGNOSIS — J949 Pleural condition, unspecified: Secondary | ICD-10-CM | POA: Diagnosis not present

## 2021-09-04 DIAGNOSIS — H3530 Unspecified macular degeneration: Secondary | ICD-10-CM | POA: Diagnosis not present

## 2021-09-04 DIAGNOSIS — Z9181 History of falling: Secondary | ICD-10-CM | POA: Diagnosis not present

## 2021-09-04 DIAGNOSIS — M6281 Muscle weakness (generalized): Secondary | ICD-10-CM | POA: Diagnosis not present

## 2021-09-04 DIAGNOSIS — R269 Unspecified abnormalities of gait and mobility: Secondary | ICD-10-CM | POA: Diagnosis not present

## 2021-09-05 DIAGNOSIS — R269 Unspecified abnormalities of gait and mobility: Secondary | ICD-10-CM | POA: Diagnosis not present

## 2021-09-05 DIAGNOSIS — R2681 Unsteadiness on feet: Secondary | ICD-10-CM | POA: Diagnosis not present

## 2021-09-05 DIAGNOSIS — Z9181 History of falling: Secondary | ICD-10-CM | POA: Diagnosis not present

## 2021-09-05 DIAGNOSIS — R4789 Other speech disturbances: Secondary | ICD-10-CM | POA: Diagnosis not present

## 2021-09-05 DIAGNOSIS — R1312 Dysphagia, oropharyngeal phase: Secondary | ICD-10-CM | POA: Diagnosis not present

## 2021-09-05 DIAGNOSIS — M6281 Muscle weakness (generalized): Secondary | ICD-10-CM | POA: Diagnosis not present

## 2021-09-05 DIAGNOSIS — H3530 Unspecified macular degeneration: Secondary | ICD-10-CM | POA: Diagnosis not present

## 2021-09-05 DIAGNOSIS — J949 Pleural condition, unspecified: Secondary | ICD-10-CM | POA: Diagnosis not present

## 2021-09-06 DIAGNOSIS — Z9181 History of falling: Secondary | ICD-10-CM | POA: Diagnosis not present

## 2021-09-06 DIAGNOSIS — R1312 Dysphagia, oropharyngeal phase: Secondary | ICD-10-CM | POA: Diagnosis not present

## 2021-09-06 DIAGNOSIS — H3530 Unspecified macular degeneration: Secondary | ICD-10-CM | POA: Diagnosis not present

## 2021-09-06 DIAGNOSIS — R4789 Other speech disturbances: Secondary | ICD-10-CM | POA: Diagnosis not present

## 2021-09-06 DIAGNOSIS — J949 Pleural condition, unspecified: Secondary | ICD-10-CM | POA: Diagnosis not present

## 2021-09-06 DIAGNOSIS — M6281 Muscle weakness (generalized): Secondary | ICD-10-CM | POA: Diagnosis not present

## 2021-09-06 DIAGNOSIS — R269 Unspecified abnormalities of gait and mobility: Secondary | ICD-10-CM | POA: Diagnosis not present

## 2021-09-06 DIAGNOSIS — R2681 Unsteadiness on feet: Secondary | ICD-10-CM | POA: Diagnosis not present

## 2021-09-07 DIAGNOSIS — R1312 Dysphagia, oropharyngeal phase: Secondary | ICD-10-CM | POA: Diagnosis not present

## 2021-09-07 DIAGNOSIS — R4789 Other speech disturbances: Secondary | ICD-10-CM | POA: Diagnosis not present

## 2021-09-07 DIAGNOSIS — R269 Unspecified abnormalities of gait and mobility: Secondary | ICD-10-CM | POA: Diagnosis not present

## 2021-09-07 DIAGNOSIS — M6281 Muscle weakness (generalized): Secondary | ICD-10-CM | POA: Diagnosis not present

## 2021-09-07 DIAGNOSIS — Z9181 History of falling: Secondary | ICD-10-CM | POA: Diagnosis not present

## 2021-09-07 DIAGNOSIS — J949 Pleural condition, unspecified: Secondary | ICD-10-CM | POA: Diagnosis not present

## 2021-09-07 DIAGNOSIS — R2681 Unsteadiness on feet: Secondary | ICD-10-CM | POA: Diagnosis not present

## 2021-09-07 DIAGNOSIS — H3530 Unspecified macular degeneration: Secondary | ICD-10-CM | POA: Diagnosis not present

## 2021-09-11 DIAGNOSIS — H3530 Unspecified macular degeneration: Secondary | ICD-10-CM | POA: Diagnosis not present

## 2021-09-11 DIAGNOSIS — J949 Pleural condition, unspecified: Secondary | ICD-10-CM | POA: Diagnosis not present

## 2021-09-11 DIAGNOSIS — Z9181 History of falling: Secondary | ICD-10-CM | POA: Diagnosis not present

## 2021-09-11 DIAGNOSIS — R4789 Other speech disturbances: Secondary | ICD-10-CM | POA: Diagnosis not present

## 2021-09-11 DIAGNOSIS — M6281 Muscle weakness (generalized): Secondary | ICD-10-CM | POA: Diagnosis not present

## 2021-09-11 DIAGNOSIS — R2681 Unsteadiness on feet: Secondary | ICD-10-CM | POA: Diagnosis not present

## 2021-09-11 DIAGNOSIS — R269 Unspecified abnormalities of gait and mobility: Secondary | ICD-10-CM | POA: Diagnosis not present

## 2021-09-11 DIAGNOSIS — R1312 Dysphagia, oropharyngeal phase: Secondary | ICD-10-CM | POA: Diagnosis not present

## 2021-09-12 DIAGNOSIS — R2681 Unsteadiness on feet: Secondary | ICD-10-CM | POA: Diagnosis not present

## 2021-09-12 DIAGNOSIS — H3530 Unspecified macular degeneration: Secondary | ICD-10-CM | POA: Diagnosis not present

## 2021-09-12 DIAGNOSIS — R269 Unspecified abnormalities of gait and mobility: Secondary | ICD-10-CM | POA: Diagnosis not present

## 2021-09-12 DIAGNOSIS — R1312 Dysphagia, oropharyngeal phase: Secondary | ICD-10-CM | POA: Diagnosis not present

## 2021-09-12 DIAGNOSIS — Z9181 History of falling: Secondary | ICD-10-CM | POA: Diagnosis not present

## 2021-09-12 DIAGNOSIS — J949 Pleural condition, unspecified: Secondary | ICD-10-CM | POA: Diagnosis not present

## 2021-09-12 DIAGNOSIS — R4789 Other speech disturbances: Secondary | ICD-10-CM | POA: Diagnosis not present

## 2021-09-12 DIAGNOSIS — M6281 Muscle weakness (generalized): Secondary | ICD-10-CM | POA: Diagnosis not present

## 2021-09-13 DIAGNOSIS — R4789 Other speech disturbances: Secondary | ICD-10-CM | POA: Diagnosis not present

## 2021-09-13 DIAGNOSIS — K219 Gastro-esophageal reflux disease without esophagitis: Secondary | ICD-10-CM | POA: Diagnosis not present

## 2021-09-13 DIAGNOSIS — E785 Hyperlipidemia, unspecified: Secondary | ICD-10-CM | POA: Diagnosis not present

## 2021-09-13 DIAGNOSIS — D62 Acute posthemorrhagic anemia: Secondary | ICD-10-CM | POA: Diagnosis not present

## 2021-09-13 DIAGNOSIS — Z9181 History of falling: Secondary | ICD-10-CM | POA: Diagnosis not present

## 2021-09-13 DIAGNOSIS — H3530 Unspecified macular degeneration: Secondary | ICD-10-CM | POA: Diagnosis not present

## 2021-09-13 DIAGNOSIS — R7301 Impaired fasting glucose: Secondary | ICD-10-CM | POA: Diagnosis not present

## 2021-09-13 DIAGNOSIS — M6281 Muscle weakness (generalized): Secondary | ICD-10-CM | POA: Diagnosis not present

## 2021-09-13 DIAGNOSIS — J949 Pleural condition, unspecified: Secondary | ICD-10-CM | POA: Diagnosis not present

## 2021-09-13 DIAGNOSIS — R269 Unspecified abnormalities of gait and mobility: Secondary | ICD-10-CM | POA: Diagnosis not present

## 2021-09-13 DIAGNOSIS — J449 Chronic obstructive pulmonary disease, unspecified: Secondary | ICD-10-CM | POA: Diagnosis not present

## 2021-09-13 DIAGNOSIS — D649 Anemia, unspecified: Secondary | ICD-10-CM | POA: Diagnosis not present

## 2021-09-13 DIAGNOSIS — R1312 Dysphagia, oropharyngeal phase: Secondary | ICD-10-CM | POA: Diagnosis not present

## 2021-09-13 DIAGNOSIS — R69 Illness, unspecified: Secondary | ICD-10-CM | POA: Diagnosis not present

## 2021-09-13 DIAGNOSIS — G2581 Restless legs syndrome: Secondary | ICD-10-CM | POA: Diagnosis not present

## 2021-09-13 DIAGNOSIS — M810 Age-related osteoporosis without current pathological fracture: Secondary | ICD-10-CM | POA: Diagnosis not present

## 2021-09-13 DIAGNOSIS — R2681 Unsteadiness on feet: Secondary | ICD-10-CM | POA: Diagnosis not present

## 2021-09-13 DIAGNOSIS — J309 Allergic rhinitis, unspecified: Secondary | ICD-10-CM | POA: Diagnosis not present

## 2021-09-14 DIAGNOSIS — R1312 Dysphagia, oropharyngeal phase: Secondary | ICD-10-CM | POA: Diagnosis not present

## 2021-09-14 DIAGNOSIS — R269 Unspecified abnormalities of gait and mobility: Secondary | ICD-10-CM | POA: Diagnosis not present

## 2021-09-14 DIAGNOSIS — R4789 Other speech disturbances: Secondary | ICD-10-CM | POA: Diagnosis not present

## 2021-09-14 DIAGNOSIS — Z9181 History of falling: Secondary | ICD-10-CM | POA: Diagnosis not present

## 2021-09-14 DIAGNOSIS — J949 Pleural condition, unspecified: Secondary | ICD-10-CM | POA: Diagnosis not present

## 2021-09-14 DIAGNOSIS — R2681 Unsteadiness on feet: Secondary | ICD-10-CM | POA: Diagnosis not present

## 2021-09-14 DIAGNOSIS — H3530 Unspecified macular degeneration: Secondary | ICD-10-CM | POA: Diagnosis not present

## 2021-09-14 DIAGNOSIS — M6281 Muscle weakness (generalized): Secondary | ICD-10-CM | POA: Diagnosis not present

## 2021-09-18 DIAGNOSIS — Z9181 History of falling: Secondary | ICD-10-CM | POA: Diagnosis not present

## 2021-09-18 DIAGNOSIS — R1312 Dysphagia, oropharyngeal phase: Secondary | ICD-10-CM | POA: Diagnosis not present

## 2021-09-18 DIAGNOSIS — R269 Unspecified abnormalities of gait and mobility: Secondary | ICD-10-CM | POA: Diagnosis not present

## 2021-09-18 DIAGNOSIS — R4789 Other speech disturbances: Secondary | ICD-10-CM | POA: Diagnosis not present

## 2021-09-18 DIAGNOSIS — R2681 Unsteadiness on feet: Secondary | ICD-10-CM | POA: Diagnosis not present

## 2021-09-18 DIAGNOSIS — M6281 Muscle weakness (generalized): Secondary | ICD-10-CM | POA: Diagnosis not present

## 2021-09-18 DIAGNOSIS — J949 Pleural condition, unspecified: Secondary | ICD-10-CM | POA: Diagnosis not present

## 2021-09-18 DIAGNOSIS — H3530 Unspecified macular degeneration: Secondary | ICD-10-CM | POA: Diagnosis not present

## 2021-09-20 DIAGNOSIS — H3530 Unspecified macular degeneration: Secondary | ICD-10-CM | POA: Diagnosis not present

## 2021-09-20 DIAGNOSIS — R2681 Unsteadiness on feet: Secondary | ICD-10-CM | POA: Diagnosis not present

## 2021-09-20 DIAGNOSIS — R269 Unspecified abnormalities of gait and mobility: Secondary | ICD-10-CM | POA: Diagnosis not present

## 2021-09-20 DIAGNOSIS — R4789 Other speech disturbances: Secondary | ICD-10-CM | POA: Diagnosis not present

## 2021-09-20 DIAGNOSIS — R1312 Dysphagia, oropharyngeal phase: Secondary | ICD-10-CM | POA: Diagnosis not present

## 2021-09-20 DIAGNOSIS — J949 Pleural condition, unspecified: Secondary | ICD-10-CM | POA: Diagnosis not present

## 2021-09-20 DIAGNOSIS — M6281 Muscle weakness (generalized): Secondary | ICD-10-CM | POA: Diagnosis not present

## 2021-09-20 DIAGNOSIS — Z9181 History of falling: Secondary | ICD-10-CM | POA: Diagnosis not present

## 2021-09-21 DIAGNOSIS — R2681 Unsteadiness on feet: Secondary | ICD-10-CM | POA: Diagnosis not present

## 2021-09-21 DIAGNOSIS — Z9181 History of falling: Secondary | ICD-10-CM | POA: Diagnosis not present

## 2021-09-21 DIAGNOSIS — R1312 Dysphagia, oropharyngeal phase: Secondary | ICD-10-CM | POA: Diagnosis not present

## 2021-09-21 DIAGNOSIS — H3530 Unspecified macular degeneration: Secondary | ICD-10-CM | POA: Diagnosis not present

## 2021-09-21 DIAGNOSIS — R269 Unspecified abnormalities of gait and mobility: Secondary | ICD-10-CM | POA: Diagnosis not present

## 2021-09-21 DIAGNOSIS — J949 Pleural condition, unspecified: Secondary | ICD-10-CM | POA: Diagnosis not present

## 2021-09-21 DIAGNOSIS — R4789 Other speech disturbances: Secondary | ICD-10-CM | POA: Diagnosis not present

## 2021-09-21 DIAGNOSIS — M6281 Muscle weakness (generalized): Secondary | ICD-10-CM | POA: Diagnosis not present

## 2021-09-25 DIAGNOSIS — R269 Unspecified abnormalities of gait and mobility: Secondary | ICD-10-CM | POA: Diagnosis not present

## 2021-09-25 DIAGNOSIS — M6281 Muscle weakness (generalized): Secondary | ICD-10-CM | POA: Diagnosis not present

## 2021-09-25 DIAGNOSIS — R1312 Dysphagia, oropharyngeal phase: Secondary | ICD-10-CM | POA: Diagnosis not present

## 2021-09-25 DIAGNOSIS — H401134 Primary open-angle glaucoma, bilateral, indeterminate stage: Secondary | ICD-10-CM | POA: Diagnosis not present

## 2021-09-25 DIAGNOSIS — R4789 Other speech disturbances: Secondary | ICD-10-CM | POA: Diagnosis not present

## 2021-09-25 DIAGNOSIS — Z9181 History of falling: Secondary | ICD-10-CM | POA: Diagnosis not present

## 2021-09-25 DIAGNOSIS — R2681 Unsteadiness on feet: Secondary | ICD-10-CM | POA: Diagnosis not present

## 2021-09-25 DIAGNOSIS — J949 Pleural condition, unspecified: Secondary | ICD-10-CM | POA: Diagnosis not present

## 2021-09-25 DIAGNOSIS — H3530 Unspecified macular degeneration: Secondary | ICD-10-CM | POA: Diagnosis not present

## 2021-09-26 DIAGNOSIS — R2681 Unsteadiness on feet: Secondary | ICD-10-CM | POA: Diagnosis not present

## 2021-09-26 DIAGNOSIS — K224 Dyskinesia of esophagus: Secondary | ICD-10-CM | POA: Diagnosis not present

## 2021-09-26 DIAGNOSIS — B3781 Candidal esophagitis: Secondary | ICD-10-CM | POA: Diagnosis not present

## 2021-09-26 DIAGNOSIS — R4789 Other speech disturbances: Secondary | ICD-10-CM | POA: Diagnosis not present

## 2021-09-26 DIAGNOSIS — R269 Unspecified abnormalities of gait and mobility: Secondary | ICD-10-CM | POA: Diagnosis not present

## 2021-09-26 DIAGNOSIS — D509 Iron deficiency anemia, unspecified: Secondary | ICD-10-CM | POA: Diagnosis not present

## 2021-09-26 DIAGNOSIS — Z8719 Personal history of other diseases of the digestive system: Secondary | ICD-10-CM | POA: Diagnosis not present

## 2021-09-26 DIAGNOSIS — K5900 Constipation, unspecified: Secondary | ICD-10-CM | POA: Diagnosis not present

## 2021-09-26 DIAGNOSIS — J949 Pleural condition, unspecified: Secondary | ICD-10-CM | POA: Diagnosis not present

## 2021-09-26 DIAGNOSIS — Z9181 History of falling: Secondary | ICD-10-CM | POA: Diagnosis not present

## 2021-09-26 DIAGNOSIS — H3530 Unspecified macular degeneration: Secondary | ICD-10-CM | POA: Diagnosis not present

## 2021-09-26 DIAGNOSIS — M6281 Muscle weakness (generalized): Secondary | ICD-10-CM | POA: Diagnosis not present

## 2021-09-26 DIAGNOSIS — R1312 Dysphagia, oropharyngeal phase: Secondary | ICD-10-CM | POA: Diagnosis not present

## 2021-09-26 DIAGNOSIS — R1310 Dysphagia, unspecified: Secondary | ICD-10-CM | POA: Diagnosis not present

## 2021-09-27 DIAGNOSIS — Z9181 History of falling: Secondary | ICD-10-CM | POA: Diagnosis not present

## 2021-09-27 DIAGNOSIS — R269 Unspecified abnormalities of gait and mobility: Secondary | ICD-10-CM | POA: Diagnosis not present

## 2021-09-27 DIAGNOSIS — M6281 Muscle weakness (generalized): Secondary | ICD-10-CM | POA: Diagnosis not present

## 2021-09-27 DIAGNOSIS — J949 Pleural condition, unspecified: Secondary | ICD-10-CM | POA: Diagnosis not present

## 2021-09-27 DIAGNOSIS — H3530 Unspecified macular degeneration: Secondary | ICD-10-CM | POA: Diagnosis not present

## 2021-09-27 DIAGNOSIS — R4789 Other speech disturbances: Secondary | ICD-10-CM | POA: Diagnosis not present

## 2021-09-27 DIAGNOSIS — R2681 Unsteadiness on feet: Secondary | ICD-10-CM | POA: Diagnosis not present

## 2021-09-27 DIAGNOSIS — R1312 Dysphagia, oropharyngeal phase: Secondary | ICD-10-CM | POA: Diagnosis not present

## 2021-09-28 DIAGNOSIS — E785 Hyperlipidemia, unspecified: Secondary | ICD-10-CM | POA: Diagnosis not present

## 2021-09-28 DIAGNOSIS — G2581 Restless legs syndrome: Secondary | ICD-10-CM | POA: Diagnosis not present

## 2021-09-28 DIAGNOSIS — M810 Age-related osteoporosis without current pathological fracture: Secondary | ICD-10-CM | POA: Diagnosis not present

## 2021-09-28 DIAGNOSIS — K219 Gastro-esophageal reflux disease without esophagitis: Secondary | ICD-10-CM | POA: Diagnosis not present

## 2021-10-01 DIAGNOSIS — R269 Unspecified abnormalities of gait and mobility: Secondary | ICD-10-CM | POA: Diagnosis not present

## 2021-10-01 DIAGNOSIS — H3530 Unspecified macular degeneration: Secondary | ICD-10-CM | POA: Diagnosis not present

## 2021-10-01 DIAGNOSIS — M6281 Muscle weakness (generalized): Secondary | ICD-10-CM | POA: Diagnosis not present

## 2021-10-01 DIAGNOSIS — Z9181 History of falling: Secondary | ICD-10-CM | POA: Diagnosis not present

## 2021-10-01 DIAGNOSIS — R2681 Unsteadiness on feet: Secondary | ICD-10-CM | POA: Diagnosis not present

## 2021-10-01 DIAGNOSIS — J949 Pleural condition, unspecified: Secondary | ICD-10-CM | POA: Diagnosis not present

## 2021-10-02 DIAGNOSIS — J949 Pleural condition, unspecified: Secondary | ICD-10-CM | POA: Diagnosis not present

## 2021-10-02 DIAGNOSIS — Z9181 History of falling: Secondary | ICD-10-CM | POA: Diagnosis not present

## 2021-10-02 DIAGNOSIS — H3530 Unspecified macular degeneration: Secondary | ICD-10-CM | POA: Diagnosis not present

## 2021-10-02 DIAGNOSIS — R269 Unspecified abnormalities of gait and mobility: Secondary | ICD-10-CM | POA: Diagnosis not present

## 2021-10-02 DIAGNOSIS — R2681 Unsteadiness on feet: Secondary | ICD-10-CM | POA: Diagnosis not present

## 2021-10-02 DIAGNOSIS — M6281 Muscle weakness (generalized): Secondary | ICD-10-CM | POA: Diagnosis not present

## 2021-10-03 DIAGNOSIS — R2681 Unsteadiness on feet: Secondary | ICD-10-CM | POA: Diagnosis not present

## 2021-10-03 DIAGNOSIS — R269 Unspecified abnormalities of gait and mobility: Secondary | ICD-10-CM | POA: Diagnosis not present

## 2021-10-03 DIAGNOSIS — H3530 Unspecified macular degeneration: Secondary | ICD-10-CM | POA: Diagnosis not present

## 2021-10-03 DIAGNOSIS — M6281 Muscle weakness (generalized): Secondary | ICD-10-CM | POA: Diagnosis not present

## 2021-10-03 DIAGNOSIS — Z9181 History of falling: Secondary | ICD-10-CM | POA: Diagnosis not present

## 2021-10-03 DIAGNOSIS — J949 Pleural condition, unspecified: Secondary | ICD-10-CM | POA: Diagnosis not present

## 2021-10-04 DIAGNOSIS — R2681 Unsteadiness on feet: Secondary | ICD-10-CM | POA: Diagnosis not present

## 2021-10-04 DIAGNOSIS — M6281 Muscle weakness (generalized): Secondary | ICD-10-CM | POA: Diagnosis not present

## 2021-10-04 DIAGNOSIS — Z9181 History of falling: Secondary | ICD-10-CM | POA: Diagnosis not present

## 2021-10-04 DIAGNOSIS — H3530 Unspecified macular degeneration: Secondary | ICD-10-CM | POA: Diagnosis not present

## 2021-10-04 DIAGNOSIS — J949 Pleural condition, unspecified: Secondary | ICD-10-CM | POA: Diagnosis not present

## 2021-10-04 DIAGNOSIS — R269 Unspecified abnormalities of gait and mobility: Secondary | ICD-10-CM | POA: Diagnosis not present

## 2021-10-08 DIAGNOSIS — M6281 Muscle weakness (generalized): Secondary | ICD-10-CM | POA: Diagnosis not present

## 2021-10-08 DIAGNOSIS — H3530 Unspecified macular degeneration: Secondary | ICD-10-CM | POA: Diagnosis not present

## 2021-10-08 DIAGNOSIS — Z9181 History of falling: Secondary | ICD-10-CM | POA: Diagnosis not present

## 2021-10-08 DIAGNOSIS — R2681 Unsteadiness on feet: Secondary | ICD-10-CM | POA: Diagnosis not present

## 2021-10-08 DIAGNOSIS — J949 Pleural condition, unspecified: Secondary | ICD-10-CM | POA: Diagnosis not present

## 2021-10-08 DIAGNOSIS — R269 Unspecified abnormalities of gait and mobility: Secondary | ICD-10-CM | POA: Diagnosis not present

## 2021-10-09 DIAGNOSIS — J949 Pleural condition, unspecified: Secondary | ICD-10-CM | POA: Diagnosis not present

## 2021-10-09 DIAGNOSIS — Z9181 History of falling: Secondary | ICD-10-CM | POA: Diagnosis not present

## 2021-10-09 DIAGNOSIS — H3530 Unspecified macular degeneration: Secondary | ICD-10-CM | POA: Diagnosis not present

## 2021-10-09 DIAGNOSIS — R269 Unspecified abnormalities of gait and mobility: Secondary | ICD-10-CM | POA: Diagnosis not present

## 2021-10-09 DIAGNOSIS — M6281 Muscle weakness (generalized): Secondary | ICD-10-CM | POA: Diagnosis not present

## 2021-10-09 DIAGNOSIS — R2681 Unsteadiness on feet: Secondary | ICD-10-CM | POA: Diagnosis not present

## 2021-10-10 DIAGNOSIS — R2681 Unsteadiness on feet: Secondary | ICD-10-CM | POA: Diagnosis not present

## 2021-10-10 DIAGNOSIS — H353131 Nonexudative age-related macular degeneration, bilateral, early dry stage: Secondary | ICD-10-CM | POA: Diagnosis not present

## 2021-10-10 DIAGNOSIS — H3530 Unspecified macular degeneration: Secondary | ICD-10-CM | POA: Diagnosis not present

## 2021-10-10 DIAGNOSIS — Z9181 History of falling: Secondary | ICD-10-CM | POA: Diagnosis not present

## 2021-10-10 DIAGNOSIS — J949 Pleural condition, unspecified: Secondary | ICD-10-CM | POA: Diagnosis not present

## 2021-10-10 DIAGNOSIS — H50112 Monocular exotropia, left eye: Secondary | ICD-10-CM | POA: Diagnosis not present

## 2021-10-10 DIAGNOSIS — H18593 Other hereditary corneal dystrophies, bilateral: Secondary | ICD-10-CM | POA: Diagnosis not present

## 2021-10-10 DIAGNOSIS — R269 Unspecified abnormalities of gait and mobility: Secondary | ICD-10-CM | POA: Diagnosis not present

## 2021-10-10 DIAGNOSIS — M6281 Muscle weakness (generalized): Secondary | ICD-10-CM | POA: Diagnosis not present

## 2021-10-10 DIAGNOSIS — H401134 Primary open-angle glaucoma, bilateral, indeterminate stage: Secondary | ICD-10-CM | POA: Diagnosis not present

## 2021-10-15 DIAGNOSIS — J949 Pleural condition, unspecified: Secondary | ICD-10-CM | POA: Diagnosis not present

## 2021-10-15 DIAGNOSIS — R269 Unspecified abnormalities of gait and mobility: Secondary | ICD-10-CM | POA: Diagnosis not present

## 2021-10-15 DIAGNOSIS — H3530 Unspecified macular degeneration: Secondary | ICD-10-CM | POA: Diagnosis not present

## 2021-10-15 DIAGNOSIS — M6281 Muscle weakness (generalized): Secondary | ICD-10-CM | POA: Diagnosis not present

## 2021-10-15 DIAGNOSIS — R2681 Unsteadiness on feet: Secondary | ICD-10-CM | POA: Diagnosis not present

## 2021-10-15 DIAGNOSIS — Z9181 History of falling: Secondary | ICD-10-CM | POA: Diagnosis not present

## 2021-10-16 DIAGNOSIS — R2681 Unsteadiness on feet: Secondary | ICD-10-CM | POA: Diagnosis not present

## 2021-10-16 DIAGNOSIS — M6281 Muscle weakness (generalized): Secondary | ICD-10-CM | POA: Diagnosis not present

## 2021-10-16 DIAGNOSIS — H3530 Unspecified macular degeneration: Secondary | ICD-10-CM | POA: Diagnosis not present

## 2021-10-16 DIAGNOSIS — Z9181 History of falling: Secondary | ICD-10-CM | POA: Diagnosis not present

## 2021-10-16 DIAGNOSIS — R269 Unspecified abnormalities of gait and mobility: Secondary | ICD-10-CM | POA: Diagnosis not present

## 2021-10-16 DIAGNOSIS — J949 Pleural condition, unspecified: Secondary | ICD-10-CM | POA: Diagnosis not present

## 2021-10-17 DIAGNOSIS — Z9181 History of falling: Secondary | ICD-10-CM | POA: Diagnosis not present

## 2021-10-17 DIAGNOSIS — R269 Unspecified abnormalities of gait and mobility: Secondary | ICD-10-CM | POA: Diagnosis not present

## 2021-10-17 DIAGNOSIS — H3530 Unspecified macular degeneration: Secondary | ICD-10-CM | POA: Diagnosis not present

## 2021-10-17 DIAGNOSIS — J949 Pleural condition, unspecified: Secondary | ICD-10-CM | POA: Diagnosis not present

## 2021-10-17 DIAGNOSIS — M6281 Muscle weakness (generalized): Secondary | ICD-10-CM | POA: Diagnosis not present

## 2021-10-17 DIAGNOSIS — R2681 Unsteadiness on feet: Secondary | ICD-10-CM | POA: Diagnosis not present

## 2021-10-18 DIAGNOSIS — R2681 Unsteadiness on feet: Secondary | ICD-10-CM | POA: Diagnosis not present

## 2021-10-18 DIAGNOSIS — M6281 Muscle weakness (generalized): Secondary | ICD-10-CM | POA: Diagnosis not present

## 2021-10-18 DIAGNOSIS — Z9181 History of falling: Secondary | ICD-10-CM | POA: Diagnosis not present

## 2021-10-18 DIAGNOSIS — J949 Pleural condition, unspecified: Secondary | ICD-10-CM | POA: Diagnosis not present

## 2021-10-18 DIAGNOSIS — H3530 Unspecified macular degeneration: Secondary | ICD-10-CM | POA: Diagnosis not present

## 2021-10-18 DIAGNOSIS — R269 Unspecified abnormalities of gait and mobility: Secondary | ICD-10-CM | POA: Diagnosis not present

## 2021-10-22 DIAGNOSIS — H3530 Unspecified macular degeneration: Secondary | ICD-10-CM | POA: Diagnosis not present

## 2021-10-22 DIAGNOSIS — M6281 Muscle weakness (generalized): Secondary | ICD-10-CM | POA: Diagnosis not present

## 2021-10-22 DIAGNOSIS — Z9181 History of falling: Secondary | ICD-10-CM | POA: Diagnosis not present

## 2021-10-22 DIAGNOSIS — R2681 Unsteadiness on feet: Secondary | ICD-10-CM | POA: Diagnosis not present

## 2021-10-22 DIAGNOSIS — R269 Unspecified abnormalities of gait and mobility: Secondary | ICD-10-CM | POA: Diagnosis not present

## 2021-10-22 DIAGNOSIS — J949 Pleural condition, unspecified: Secondary | ICD-10-CM | POA: Diagnosis not present

## 2021-10-23 DIAGNOSIS — M6281 Muscle weakness (generalized): Secondary | ICD-10-CM | POA: Diagnosis not present

## 2021-10-23 DIAGNOSIS — H3530 Unspecified macular degeneration: Secondary | ICD-10-CM | POA: Diagnosis not present

## 2021-10-23 DIAGNOSIS — R2681 Unsteadiness on feet: Secondary | ICD-10-CM | POA: Diagnosis not present

## 2021-10-23 DIAGNOSIS — J949 Pleural condition, unspecified: Secondary | ICD-10-CM | POA: Diagnosis not present

## 2021-10-23 DIAGNOSIS — R269 Unspecified abnormalities of gait and mobility: Secondary | ICD-10-CM | POA: Diagnosis not present

## 2021-10-23 DIAGNOSIS — Z9181 History of falling: Secondary | ICD-10-CM | POA: Diagnosis not present

## 2021-10-24 DIAGNOSIS — R269 Unspecified abnormalities of gait and mobility: Secondary | ICD-10-CM | POA: Diagnosis not present

## 2021-10-24 DIAGNOSIS — R2681 Unsteadiness on feet: Secondary | ICD-10-CM | POA: Diagnosis not present

## 2021-10-24 DIAGNOSIS — J949 Pleural condition, unspecified: Secondary | ICD-10-CM | POA: Diagnosis not present

## 2021-10-24 DIAGNOSIS — M6281 Muscle weakness (generalized): Secondary | ICD-10-CM | POA: Diagnosis not present

## 2021-10-24 DIAGNOSIS — H3530 Unspecified macular degeneration: Secondary | ICD-10-CM | POA: Diagnosis not present

## 2021-10-24 DIAGNOSIS — Z9181 History of falling: Secondary | ICD-10-CM | POA: Diagnosis not present

## 2021-10-25 DIAGNOSIS — H3530 Unspecified macular degeneration: Secondary | ICD-10-CM | POA: Diagnosis not present

## 2021-10-25 DIAGNOSIS — Z9181 History of falling: Secondary | ICD-10-CM | POA: Diagnosis not present

## 2021-10-25 DIAGNOSIS — J949 Pleural condition, unspecified: Secondary | ICD-10-CM | POA: Diagnosis not present

## 2021-10-25 DIAGNOSIS — R269 Unspecified abnormalities of gait and mobility: Secondary | ICD-10-CM | POA: Diagnosis not present

## 2021-10-25 DIAGNOSIS — M6281 Muscle weakness (generalized): Secondary | ICD-10-CM | POA: Diagnosis not present

## 2021-10-25 DIAGNOSIS — R2681 Unsteadiness on feet: Secondary | ICD-10-CM | POA: Diagnosis not present

## 2021-10-29 DIAGNOSIS — H3530 Unspecified macular degeneration: Secondary | ICD-10-CM | POA: Diagnosis not present

## 2021-10-29 DIAGNOSIS — R269 Unspecified abnormalities of gait and mobility: Secondary | ICD-10-CM | POA: Diagnosis not present

## 2021-10-29 DIAGNOSIS — M6281 Muscle weakness (generalized): Secondary | ICD-10-CM | POA: Diagnosis not present

## 2021-10-29 DIAGNOSIS — J949 Pleural condition, unspecified: Secondary | ICD-10-CM | POA: Diagnosis not present

## 2021-10-29 DIAGNOSIS — R2681 Unsteadiness on feet: Secondary | ICD-10-CM | POA: Diagnosis not present

## 2021-10-29 DIAGNOSIS — Z9181 History of falling: Secondary | ICD-10-CM | POA: Diagnosis not present

## 2021-10-31 DIAGNOSIS — R269 Unspecified abnormalities of gait and mobility: Secondary | ICD-10-CM | POA: Diagnosis not present

## 2021-10-31 DIAGNOSIS — Z9181 History of falling: Secondary | ICD-10-CM | POA: Diagnosis not present

## 2021-10-31 DIAGNOSIS — R2681 Unsteadiness on feet: Secondary | ICD-10-CM | POA: Diagnosis not present

## 2021-10-31 DIAGNOSIS — M6281 Muscle weakness (generalized): Secondary | ICD-10-CM | POA: Diagnosis not present

## 2021-10-31 DIAGNOSIS — J949 Pleural condition, unspecified: Secondary | ICD-10-CM | POA: Diagnosis not present

## 2021-10-31 DIAGNOSIS — H3530 Unspecified macular degeneration: Secondary | ICD-10-CM | POA: Diagnosis not present

## 2021-11-01 DIAGNOSIS — H3530 Unspecified macular degeneration: Secondary | ICD-10-CM | POA: Diagnosis not present

## 2021-11-01 DIAGNOSIS — R2681 Unsteadiness on feet: Secondary | ICD-10-CM | POA: Diagnosis not present

## 2021-11-01 DIAGNOSIS — R269 Unspecified abnormalities of gait and mobility: Secondary | ICD-10-CM | POA: Diagnosis not present

## 2021-11-01 DIAGNOSIS — Z9181 History of falling: Secondary | ICD-10-CM | POA: Diagnosis not present

## 2021-11-01 DIAGNOSIS — J949 Pleural condition, unspecified: Secondary | ICD-10-CM | POA: Diagnosis not present

## 2021-11-01 DIAGNOSIS — M6281 Muscle weakness (generalized): Secondary | ICD-10-CM | POA: Diagnosis not present

## 2021-11-05 DIAGNOSIS — R2681 Unsteadiness on feet: Secondary | ICD-10-CM | POA: Diagnosis not present

## 2021-11-05 DIAGNOSIS — J949 Pleural condition, unspecified: Secondary | ICD-10-CM | POA: Diagnosis not present

## 2021-11-05 DIAGNOSIS — Z9181 History of falling: Secondary | ICD-10-CM | POA: Diagnosis not present

## 2021-11-05 DIAGNOSIS — M6281 Muscle weakness (generalized): Secondary | ICD-10-CM | POA: Diagnosis not present

## 2021-11-05 DIAGNOSIS — H3530 Unspecified macular degeneration: Secondary | ICD-10-CM | POA: Diagnosis not present

## 2021-11-05 DIAGNOSIS — R269 Unspecified abnormalities of gait and mobility: Secondary | ICD-10-CM | POA: Diagnosis not present

## 2021-11-06 DIAGNOSIS — H3530 Unspecified macular degeneration: Secondary | ICD-10-CM | POA: Diagnosis not present

## 2021-11-06 DIAGNOSIS — R2681 Unsteadiness on feet: Secondary | ICD-10-CM | POA: Diagnosis not present

## 2021-11-06 DIAGNOSIS — J949 Pleural condition, unspecified: Secondary | ICD-10-CM | POA: Diagnosis not present

## 2021-11-06 DIAGNOSIS — Z9181 History of falling: Secondary | ICD-10-CM | POA: Diagnosis not present

## 2021-11-06 DIAGNOSIS — M6281 Muscle weakness (generalized): Secondary | ICD-10-CM | POA: Diagnosis not present

## 2021-11-06 DIAGNOSIS — R269 Unspecified abnormalities of gait and mobility: Secondary | ICD-10-CM | POA: Diagnosis not present

## 2021-11-07 DIAGNOSIS — J949 Pleural condition, unspecified: Secondary | ICD-10-CM | POA: Diagnosis not present

## 2021-11-07 DIAGNOSIS — Z9181 History of falling: Secondary | ICD-10-CM | POA: Diagnosis not present

## 2021-11-07 DIAGNOSIS — R2681 Unsteadiness on feet: Secondary | ICD-10-CM | POA: Diagnosis not present

## 2021-11-07 DIAGNOSIS — R269 Unspecified abnormalities of gait and mobility: Secondary | ICD-10-CM | POA: Diagnosis not present

## 2021-11-07 DIAGNOSIS — H3530 Unspecified macular degeneration: Secondary | ICD-10-CM | POA: Diagnosis not present

## 2021-11-07 DIAGNOSIS — M6281 Muscle weakness (generalized): Secondary | ICD-10-CM | POA: Diagnosis not present

## 2021-11-08 DIAGNOSIS — J949 Pleural condition, unspecified: Secondary | ICD-10-CM | POA: Diagnosis not present

## 2021-11-08 DIAGNOSIS — H3530 Unspecified macular degeneration: Secondary | ICD-10-CM | POA: Diagnosis not present

## 2021-11-08 DIAGNOSIS — R269 Unspecified abnormalities of gait and mobility: Secondary | ICD-10-CM | POA: Diagnosis not present

## 2021-11-08 DIAGNOSIS — R2681 Unsteadiness on feet: Secondary | ICD-10-CM | POA: Diagnosis not present

## 2021-11-08 DIAGNOSIS — M6281 Muscle weakness (generalized): Secondary | ICD-10-CM | POA: Diagnosis not present

## 2021-11-08 DIAGNOSIS — Z9181 History of falling: Secondary | ICD-10-CM | POA: Diagnosis not present

## 2021-11-12 DIAGNOSIS — R269 Unspecified abnormalities of gait and mobility: Secondary | ICD-10-CM | POA: Diagnosis not present

## 2021-11-12 DIAGNOSIS — Z9181 History of falling: Secondary | ICD-10-CM | POA: Diagnosis not present

## 2021-11-12 DIAGNOSIS — R2681 Unsteadiness on feet: Secondary | ICD-10-CM | POA: Diagnosis not present

## 2021-11-12 DIAGNOSIS — J949 Pleural condition, unspecified: Secondary | ICD-10-CM | POA: Diagnosis not present

## 2021-11-12 DIAGNOSIS — H3530 Unspecified macular degeneration: Secondary | ICD-10-CM | POA: Diagnosis not present

## 2021-11-12 DIAGNOSIS — M6281 Muscle weakness (generalized): Secondary | ICD-10-CM | POA: Diagnosis not present

## 2021-11-14 DIAGNOSIS — R2681 Unsteadiness on feet: Secondary | ICD-10-CM | POA: Diagnosis not present

## 2021-11-14 DIAGNOSIS — Z9181 History of falling: Secondary | ICD-10-CM | POA: Diagnosis not present

## 2021-11-14 DIAGNOSIS — R269 Unspecified abnormalities of gait and mobility: Secondary | ICD-10-CM | POA: Diagnosis not present

## 2021-11-14 DIAGNOSIS — J949 Pleural condition, unspecified: Secondary | ICD-10-CM | POA: Diagnosis not present

## 2021-11-14 DIAGNOSIS — H3530 Unspecified macular degeneration: Secondary | ICD-10-CM | POA: Diagnosis not present

## 2021-11-14 DIAGNOSIS — M6281 Muscle weakness (generalized): Secondary | ICD-10-CM | POA: Diagnosis not present

## 2021-11-15 DIAGNOSIS — M6281 Muscle weakness (generalized): Secondary | ICD-10-CM | POA: Diagnosis not present

## 2021-11-15 DIAGNOSIS — R2681 Unsteadiness on feet: Secondary | ICD-10-CM | POA: Diagnosis not present

## 2021-11-15 DIAGNOSIS — H3530 Unspecified macular degeneration: Secondary | ICD-10-CM | POA: Diagnosis not present

## 2021-11-15 DIAGNOSIS — J949 Pleural condition, unspecified: Secondary | ICD-10-CM | POA: Diagnosis not present

## 2021-11-15 DIAGNOSIS — D62 Acute posthemorrhagic anemia: Secondary | ICD-10-CM | POA: Diagnosis not present

## 2021-11-15 DIAGNOSIS — Z9181 History of falling: Secondary | ICD-10-CM | POA: Diagnosis not present

## 2021-11-15 DIAGNOSIS — K625 Hemorrhage of anus and rectum: Secondary | ICD-10-CM | POA: Diagnosis not present

## 2021-11-15 DIAGNOSIS — R269 Unspecified abnormalities of gait and mobility: Secondary | ICD-10-CM | POA: Diagnosis not present

## 2021-11-15 DIAGNOSIS — E785 Hyperlipidemia, unspecified: Secondary | ICD-10-CM | POA: Diagnosis not present

## 2021-11-16 DIAGNOSIS — R269 Unspecified abnormalities of gait and mobility: Secondary | ICD-10-CM | POA: Diagnosis not present

## 2021-11-16 DIAGNOSIS — Z9181 History of falling: Secondary | ICD-10-CM | POA: Diagnosis not present

## 2021-11-16 DIAGNOSIS — R2681 Unsteadiness on feet: Secondary | ICD-10-CM | POA: Diagnosis not present

## 2021-11-16 DIAGNOSIS — H3530 Unspecified macular degeneration: Secondary | ICD-10-CM | POA: Diagnosis not present

## 2021-11-16 DIAGNOSIS — J949 Pleural condition, unspecified: Secondary | ICD-10-CM | POA: Diagnosis not present

## 2021-11-16 DIAGNOSIS — M6281 Muscle weakness (generalized): Secondary | ICD-10-CM | POA: Diagnosis not present

## 2021-11-19 DIAGNOSIS — M6281 Muscle weakness (generalized): Secondary | ICD-10-CM | POA: Diagnosis not present

## 2021-11-19 DIAGNOSIS — R269 Unspecified abnormalities of gait and mobility: Secondary | ICD-10-CM | POA: Diagnosis not present

## 2021-11-19 DIAGNOSIS — H3530 Unspecified macular degeneration: Secondary | ICD-10-CM | POA: Diagnosis not present

## 2021-11-19 DIAGNOSIS — Z9181 History of falling: Secondary | ICD-10-CM | POA: Diagnosis not present

## 2021-11-19 DIAGNOSIS — R2681 Unsteadiness on feet: Secondary | ICD-10-CM | POA: Diagnosis not present

## 2021-11-19 DIAGNOSIS — J949 Pleural condition, unspecified: Secondary | ICD-10-CM | POA: Diagnosis not present

## 2021-11-21 DIAGNOSIS — Z9181 History of falling: Secondary | ICD-10-CM | POA: Diagnosis not present

## 2021-11-21 DIAGNOSIS — M6281 Muscle weakness (generalized): Secondary | ICD-10-CM | POA: Diagnosis not present

## 2021-11-21 DIAGNOSIS — R2681 Unsteadiness on feet: Secondary | ICD-10-CM | POA: Diagnosis not present

## 2021-11-21 DIAGNOSIS — R269 Unspecified abnormalities of gait and mobility: Secondary | ICD-10-CM | POA: Diagnosis not present

## 2021-11-21 DIAGNOSIS — J949 Pleural condition, unspecified: Secondary | ICD-10-CM | POA: Diagnosis not present

## 2021-11-21 DIAGNOSIS — H3530 Unspecified macular degeneration: Secondary | ICD-10-CM | POA: Diagnosis not present

## 2021-11-23 DIAGNOSIS — M6281 Muscle weakness (generalized): Secondary | ICD-10-CM | POA: Diagnosis not present

## 2021-11-23 DIAGNOSIS — Z9181 History of falling: Secondary | ICD-10-CM | POA: Diagnosis not present

## 2021-11-23 DIAGNOSIS — H3530 Unspecified macular degeneration: Secondary | ICD-10-CM | POA: Diagnosis not present

## 2021-11-23 DIAGNOSIS — J949 Pleural condition, unspecified: Secondary | ICD-10-CM | POA: Diagnosis not present

## 2021-11-23 DIAGNOSIS — R2681 Unsteadiness on feet: Secondary | ICD-10-CM | POA: Diagnosis not present

## 2021-11-23 DIAGNOSIS — R269 Unspecified abnormalities of gait and mobility: Secondary | ICD-10-CM | POA: Diagnosis not present

## 2021-11-26 DIAGNOSIS — R269 Unspecified abnormalities of gait and mobility: Secondary | ICD-10-CM | POA: Diagnosis not present

## 2021-11-26 DIAGNOSIS — J949 Pleural condition, unspecified: Secondary | ICD-10-CM | POA: Diagnosis not present

## 2021-11-26 DIAGNOSIS — H3530 Unspecified macular degeneration: Secondary | ICD-10-CM | POA: Diagnosis not present

## 2021-11-26 DIAGNOSIS — R2681 Unsteadiness on feet: Secondary | ICD-10-CM | POA: Diagnosis not present

## 2021-11-26 DIAGNOSIS — Z9181 History of falling: Secondary | ICD-10-CM | POA: Diagnosis not present

## 2021-11-26 DIAGNOSIS — M6281 Muscle weakness (generalized): Secondary | ICD-10-CM | POA: Diagnosis not present

## 2021-11-28 DIAGNOSIS — M6281 Muscle weakness (generalized): Secondary | ICD-10-CM | POA: Diagnosis not present

## 2021-11-28 DIAGNOSIS — R269 Unspecified abnormalities of gait and mobility: Secondary | ICD-10-CM | POA: Diagnosis not present

## 2021-11-28 DIAGNOSIS — Z9181 History of falling: Secondary | ICD-10-CM | POA: Diagnosis not present

## 2021-11-28 DIAGNOSIS — R2681 Unsteadiness on feet: Secondary | ICD-10-CM | POA: Diagnosis not present

## 2021-11-28 DIAGNOSIS — H3530 Unspecified macular degeneration: Secondary | ICD-10-CM | POA: Diagnosis not present

## 2021-11-28 DIAGNOSIS — J949 Pleural condition, unspecified: Secondary | ICD-10-CM | POA: Diagnosis not present

## 2021-11-29 DIAGNOSIS — H3530 Unspecified macular degeneration: Secondary | ICD-10-CM | POA: Diagnosis not present

## 2021-11-29 DIAGNOSIS — J949 Pleural condition, unspecified: Secondary | ICD-10-CM | POA: Diagnosis not present

## 2021-11-29 DIAGNOSIS — Z9181 History of falling: Secondary | ICD-10-CM | POA: Diagnosis not present

## 2021-11-29 DIAGNOSIS — R2681 Unsteadiness on feet: Secondary | ICD-10-CM | POA: Diagnosis not present

## 2021-11-29 DIAGNOSIS — R269 Unspecified abnormalities of gait and mobility: Secondary | ICD-10-CM | POA: Diagnosis not present

## 2021-11-29 DIAGNOSIS — M6281 Muscle weakness (generalized): Secondary | ICD-10-CM | POA: Diagnosis not present

## 2021-11-30 DIAGNOSIS — J949 Pleural condition, unspecified: Secondary | ICD-10-CM | POA: Diagnosis not present

## 2021-11-30 DIAGNOSIS — H3530 Unspecified macular degeneration: Secondary | ICD-10-CM | POA: Diagnosis not present

## 2021-11-30 DIAGNOSIS — R269 Unspecified abnormalities of gait and mobility: Secondary | ICD-10-CM | POA: Diagnosis not present

## 2021-11-30 DIAGNOSIS — Z9181 History of falling: Secondary | ICD-10-CM | POA: Diagnosis not present

## 2021-11-30 DIAGNOSIS — R2681 Unsteadiness on feet: Secondary | ICD-10-CM | POA: Diagnosis not present

## 2021-11-30 DIAGNOSIS — M6281 Muscle weakness (generalized): Secondary | ICD-10-CM | POA: Diagnosis not present

## 2021-12-03 DIAGNOSIS — J949 Pleural condition, unspecified: Secondary | ICD-10-CM | POA: Diagnosis not present

## 2021-12-03 DIAGNOSIS — Z9181 History of falling: Secondary | ICD-10-CM | POA: Diagnosis not present

## 2021-12-03 DIAGNOSIS — M6281 Muscle weakness (generalized): Secondary | ICD-10-CM | POA: Diagnosis not present

## 2021-12-03 DIAGNOSIS — R269 Unspecified abnormalities of gait and mobility: Secondary | ICD-10-CM | POA: Diagnosis not present

## 2021-12-03 DIAGNOSIS — R2681 Unsteadiness on feet: Secondary | ICD-10-CM | POA: Diagnosis not present

## 2021-12-03 DIAGNOSIS — H3530 Unspecified macular degeneration: Secondary | ICD-10-CM | POA: Diagnosis not present

## 2021-12-05 DIAGNOSIS — J949 Pleural condition, unspecified: Secondary | ICD-10-CM | POA: Diagnosis not present

## 2021-12-05 DIAGNOSIS — H3530 Unspecified macular degeneration: Secondary | ICD-10-CM | POA: Diagnosis not present

## 2021-12-05 DIAGNOSIS — M6281 Muscle weakness (generalized): Secondary | ICD-10-CM | POA: Diagnosis not present

## 2021-12-05 DIAGNOSIS — R2681 Unsteadiness on feet: Secondary | ICD-10-CM | POA: Diagnosis not present

## 2021-12-05 DIAGNOSIS — Z9181 History of falling: Secondary | ICD-10-CM | POA: Diagnosis not present

## 2021-12-05 DIAGNOSIS — R269 Unspecified abnormalities of gait and mobility: Secondary | ICD-10-CM | POA: Diagnosis not present

## 2021-12-07 DIAGNOSIS — R269 Unspecified abnormalities of gait and mobility: Secondary | ICD-10-CM | POA: Diagnosis not present

## 2021-12-07 DIAGNOSIS — H3530 Unspecified macular degeneration: Secondary | ICD-10-CM | POA: Diagnosis not present

## 2021-12-07 DIAGNOSIS — M6281 Muscle weakness (generalized): Secondary | ICD-10-CM | POA: Diagnosis not present

## 2021-12-07 DIAGNOSIS — R2681 Unsteadiness on feet: Secondary | ICD-10-CM | POA: Diagnosis not present

## 2021-12-07 DIAGNOSIS — Z9181 History of falling: Secondary | ICD-10-CM | POA: Diagnosis not present

## 2021-12-07 DIAGNOSIS — J949 Pleural condition, unspecified: Secondary | ICD-10-CM | POA: Diagnosis not present

## 2021-12-10 DIAGNOSIS — Z9181 History of falling: Secondary | ICD-10-CM | POA: Diagnosis not present

## 2021-12-10 DIAGNOSIS — R269 Unspecified abnormalities of gait and mobility: Secondary | ICD-10-CM | POA: Diagnosis not present

## 2021-12-10 DIAGNOSIS — M6281 Muscle weakness (generalized): Secondary | ICD-10-CM | POA: Diagnosis not present

## 2021-12-10 DIAGNOSIS — J949 Pleural condition, unspecified: Secondary | ICD-10-CM | POA: Diagnosis not present

## 2021-12-10 DIAGNOSIS — R2681 Unsteadiness on feet: Secondary | ICD-10-CM | POA: Diagnosis not present

## 2021-12-10 DIAGNOSIS — H3530 Unspecified macular degeneration: Secondary | ICD-10-CM | POA: Diagnosis not present

## 2021-12-12 DIAGNOSIS — R2681 Unsteadiness on feet: Secondary | ICD-10-CM | POA: Diagnosis not present

## 2021-12-12 DIAGNOSIS — H3530 Unspecified macular degeneration: Secondary | ICD-10-CM | POA: Diagnosis not present

## 2021-12-12 DIAGNOSIS — J949 Pleural condition, unspecified: Secondary | ICD-10-CM | POA: Diagnosis not present

## 2021-12-12 DIAGNOSIS — M6281 Muscle weakness (generalized): Secondary | ICD-10-CM | POA: Diagnosis not present

## 2021-12-12 DIAGNOSIS — R269 Unspecified abnormalities of gait and mobility: Secondary | ICD-10-CM | POA: Diagnosis not present

## 2021-12-12 DIAGNOSIS — Z9181 History of falling: Secondary | ICD-10-CM | POA: Diagnosis not present

## 2021-12-13 DIAGNOSIS — Z1231 Encounter for screening mammogram for malignant neoplasm of breast: Secondary | ICD-10-CM | POA: Diagnosis not present

## 2021-12-14 DIAGNOSIS — R269 Unspecified abnormalities of gait and mobility: Secondary | ICD-10-CM | POA: Diagnosis not present

## 2021-12-14 DIAGNOSIS — Z9181 History of falling: Secondary | ICD-10-CM | POA: Diagnosis not present

## 2021-12-14 DIAGNOSIS — J949 Pleural condition, unspecified: Secondary | ICD-10-CM | POA: Diagnosis not present

## 2021-12-14 DIAGNOSIS — R2681 Unsteadiness on feet: Secondary | ICD-10-CM | POA: Diagnosis not present

## 2021-12-14 DIAGNOSIS — H3530 Unspecified macular degeneration: Secondary | ICD-10-CM | POA: Diagnosis not present

## 2021-12-14 DIAGNOSIS — M6281 Muscle weakness (generalized): Secondary | ICD-10-CM | POA: Diagnosis not present

## 2021-12-17 DIAGNOSIS — H3530 Unspecified macular degeneration: Secondary | ICD-10-CM | POA: Diagnosis not present

## 2021-12-17 DIAGNOSIS — R269 Unspecified abnormalities of gait and mobility: Secondary | ICD-10-CM | POA: Diagnosis not present

## 2021-12-17 DIAGNOSIS — R2681 Unsteadiness on feet: Secondary | ICD-10-CM | POA: Diagnosis not present

## 2021-12-17 DIAGNOSIS — Z9181 History of falling: Secondary | ICD-10-CM | POA: Diagnosis not present

## 2021-12-17 DIAGNOSIS — J949 Pleural condition, unspecified: Secondary | ICD-10-CM | POA: Diagnosis not present

## 2021-12-17 DIAGNOSIS — M6281 Muscle weakness (generalized): Secondary | ICD-10-CM | POA: Diagnosis not present

## 2021-12-19 DIAGNOSIS — H18593 Other hereditary corneal dystrophies, bilateral: Secondary | ICD-10-CM | POA: Diagnosis not present

## 2021-12-19 DIAGNOSIS — J949 Pleural condition, unspecified: Secondary | ICD-10-CM | POA: Diagnosis not present

## 2021-12-19 DIAGNOSIS — M6281 Muscle weakness (generalized): Secondary | ICD-10-CM | POA: Diagnosis not present

## 2021-12-19 DIAGNOSIS — H353131 Nonexudative age-related macular degeneration, bilateral, early dry stage: Secondary | ICD-10-CM | POA: Diagnosis not present

## 2021-12-19 DIAGNOSIS — H3530 Unspecified macular degeneration: Secondary | ICD-10-CM | POA: Diagnosis not present

## 2021-12-19 DIAGNOSIS — R2681 Unsteadiness on feet: Secondary | ICD-10-CM | POA: Diagnosis not present

## 2021-12-19 DIAGNOSIS — H401134 Primary open-angle glaucoma, bilateral, indeterminate stage: Secondary | ICD-10-CM | POA: Diagnosis not present

## 2021-12-19 DIAGNOSIS — R269 Unspecified abnormalities of gait and mobility: Secondary | ICD-10-CM | POA: Diagnosis not present

## 2021-12-19 DIAGNOSIS — Z9181 History of falling: Secondary | ICD-10-CM | POA: Diagnosis not present

## 2021-12-19 DIAGNOSIS — H50112 Monocular exotropia, left eye: Secondary | ICD-10-CM | POA: Diagnosis not present

## 2021-12-21 DIAGNOSIS — M6281 Muscle weakness (generalized): Secondary | ICD-10-CM | POA: Diagnosis not present

## 2021-12-21 DIAGNOSIS — J949 Pleural condition, unspecified: Secondary | ICD-10-CM | POA: Diagnosis not present

## 2021-12-21 DIAGNOSIS — H3530 Unspecified macular degeneration: Secondary | ICD-10-CM | POA: Diagnosis not present

## 2021-12-21 DIAGNOSIS — R269 Unspecified abnormalities of gait and mobility: Secondary | ICD-10-CM | POA: Diagnosis not present

## 2021-12-21 DIAGNOSIS — Z9181 History of falling: Secondary | ICD-10-CM | POA: Diagnosis not present

## 2021-12-21 DIAGNOSIS — R2681 Unsteadiness on feet: Secondary | ICD-10-CM | POA: Diagnosis not present

## 2021-12-24 DIAGNOSIS — Z9181 History of falling: Secondary | ICD-10-CM | POA: Diagnosis not present

## 2021-12-24 DIAGNOSIS — R2681 Unsteadiness on feet: Secondary | ICD-10-CM | POA: Diagnosis not present

## 2021-12-24 DIAGNOSIS — M6281 Muscle weakness (generalized): Secondary | ICD-10-CM | POA: Diagnosis not present

## 2021-12-24 DIAGNOSIS — J949 Pleural condition, unspecified: Secondary | ICD-10-CM | POA: Diagnosis not present

## 2021-12-24 DIAGNOSIS — H3530 Unspecified macular degeneration: Secondary | ICD-10-CM | POA: Diagnosis not present

## 2021-12-24 DIAGNOSIS — R269 Unspecified abnormalities of gait and mobility: Secondary | ICD-10-CM | POA: Diagnosis not present

## 2021-12-26 DIAGNOSIS — J949 Pleural condition, unspecified: Secondary | ICD-10-CM | POA: Diagnosis not present

## 2021-12-26 DIAGNOSIS — R2681 Unsteadiness on feet: Secondary | ICD-10-CM | POA: Diagnosis not present

## 2021-12-26 DIAGNOSIS — H3530 Unspecified macular degeneration: Secondary | ICD-10-CM | POA: Diagnosis not present

## 2021-12-26 DIAGNOSIS — R269 Unspecified abnormalities of gait and mobility: Secondary | ICD-10-CM | POA: Diagnosis not present

## 2021-12-26 DIAGNOSIS — M6281 Muscle weakness (generalized): Secondary | ICD-10-CM | POA: Diagnosis not present

## 2021-12-26 DIAGNOSIS — Z9181 History of falling: Secondary | ICD-10-CM | POA: Diagnosis not present

## 2021-12-27 DIAGNOSIS — R2681 Unsteadiness on feet: Secondary | ICD-10-CM | POA: Diagnosis not present

## 2021-12-27 DIAGNOSIS — H3530 Unspecified macular degeneration: Secondary | ICD-10-CM | POA: Diagnosis not present

## 2021-12-27 DIAGNOSIS — M6281 Muscle weakness (generalized): Secondary | ICD-10-CM | POA: Diagnosis not present

## 2021-12-27 DIAGNOSIS — Z9181 History of falling: Secondary | ICD-10-CM | POA: Diagnosis not present

## 2021-12-27 DIAGNOSIS — J949 Pleural condition, unspecified: Secondary | ICD-10-CM | POA: Diagnosis not present

## 2021-12-27 DIAGNOSIS — R269 Unspecified abnormalities of gait and mobility: Secondary | ICD-10-CM | POA: Diagnosis not present

## 2021-12-31 DIAGNOSIS — M6281 Muscle weakness (generalized): Secondary | ICD-10-CM | POA: Diagnosis not present

## 2021-12-31 DIAGNOSIS — R2689 Other abnormalities of gait and mobility: Secondary | ICD-10-CM | POA: Diagnosis not present

## 2021-12-31 DIAGNOSIS — J949 Pleural condition, unspecified: Secondary | ICD-10-CM | POA: Diagnosis not present

## 2021-12-31 DIAGNOSIS — H3530 Unspecified macular degeneration: Secondary | ICD-10-CM | POA: Diagnosis not present

## 2022-01-02 DIAGNOSIS — J949 Pleural condition, unspecified: Secondary | ICD-10-CM | POA: Diagnosis not present

## 2022-01-02 DIAGNOSIS — H3530 Unspecified macular degeneration: Secondary | ICD-10-CM | POA: Diagnosis not present

## 2022-01-02 DIAGNOSIS — M6281 Muscle weakness (generalized): Secondary | ICD-10-CM | POA: Diagnosis not present

## 2022-01-02 DIAGNOSIS — R2689 Other abnormalities of gait and mobility: Secondary | ICD-10-CM | POA: Diagnosis not present

## 2022-01-04 DIAGNOSIS — M6281 Muscle weakness (generalized): Secondary | ICD-10-CM | POA: Diagnosis not present

## 2022-01-04 DIAGNOSIS — H3530 Unspecified macular degeneration: Secondary | ICD-10-CM | POA: Diagnosis not present

## 2022-01-04 DIAGNOSIS — R2689 Other abnormalities of gait and mobility: Secondary | ICD-10-CM | POA: Diagnosis not present

## 2022-01-04 DIAGNOSIS — J949 Pleural condition, unspecified: Secondary | ICD-10-CM | POA: Diagnosis not present

## 2022-01-07 ENCOUNTER — Inpatient Hospital Stay (HOSPITAL_COMMUNITY)
Admission: EM | Admit: 2022-01-07 | Discharge: 2022-01-14 | DRG: 378 | Disposition: A | Discharge status: Home Health | Payer: Medicare HMO | Attending: Internal Medicine | Admitting: Internal Medicine

## 2022-01-07 DIAGNOSIS — K317 Polyp of stomach and duodenum: Secondary | ICD-10-CM | POA: Diagnosis not present

## 2022-01-07 DIAGNOSIS — D62 Acute posthemorrhagic anemia: Secondary | ICD-10-CM | POA: Diagnosis not present

## 2022-01-07 DIAGNOSIS — D649 Anemia, unspecified: Secondary | ICD-10-CM | POA: Diagnosis present

## 2022-01-07 DIAGNOSIS — M47812 Spondylosis without myelopathy or radiculopathy, cervical region: Secondary | ICD-10-CM | POA: Diagnosis present

## 2022-01-07 DIAGNOSIS — Z66 Do not resuscitate: Secondary | ICD-10-CM | POA: Diagnosis not present

## 2022-01-07 DIAGNOSIS — J449 Chronic obstructive pulmonary disease, unspecified: Secondary | ICD-10-CM | POA: Diagnosis not present

## 2022-01-07 DIAGNOSIS — I959 Hypotension, unspecified: Secondary | ICD-10-CM | POA: Diagnosis not present

## 2022-01-07 DIAGNOSIS — R000 Tachycardia, unspecified: Secondary | ICD-10-CM | POA: Diagnosis not present

## 2022-01-07 DIAGNOSIS — D500 Iron deficiency anemia secondary to blood loss (chronic): Secondary | ICD-10-CM | POA: Diagnosis not present

## 2022-01-07 DIAGNOSIS — K921 Melena: Secondary | ICD-10-CM | POA: Diagnosis not present

## 2022-01-07 DIAGNOSIS — M19022 Primary osteoarthritis, left elbow: Secondary | ICD-10-CM | POA: Diagnosis not present

## 2022-01-07 DIAGNOSIS — F419 Anxiety disorder, unspecified: Secondary | ICD-10-CM | POA: Diagnosis not present

## 2022-01-07 DIAGNOSIS — Z85828 Personal history of other malignant neoplasm of skin: Secondary | ICD-10-CM

## 2022-01-07 DIAGNOSIS — M19021 Primary osteoarthritis, right elbow: Secondary | ICD-10-CM | POA: Diagnosis present

## 2022-01-07 DIAGNOSIS — E872 Acidosis, unspecified: Secondary | ICD-10-CM | POA: Diagnosis not present

## 2022-01-07 DIAGNOSIS — H3530 Unspecified macular degeneration: Secondary | ICD-10-CM | POA: Diagnosis present

## 2022-01-07 DIAGNOSIS — Z8052 Family history of malignant neoplasm of bladder: Secondary | ICD-10-CM

## 2022-01-07 DIAGNOSIS — K219 Gastro-esophageal reflux disease without esophagitis: Secondary | ICD-10-CM | POA: Diagnosis present

## 2022-01-07 DIAGNOSIS — H8110 Benign paroxysmal vertigo, unspecified ear: Secondary | ICD-10-CM | POA: Diagnosis present

## 2022-01-07 DIAGNOSIS — F32A Depression, unspecified: Secondary | ICD-10-CM | POA: Diagnosis not present

## 2022-01-07 DIAGNOSIS — Z8249 Family history of ischemic heart disease and other diseases of the circulatory system: Secondary | ICD-10-CM

## 2022-01-07 DIAGNOSIS — K922 Gastrointestinal hemorrhage, unspecified: Secondary | ICD-10-CM | POA: Diagnosis present

## 2022-01-07 DIAGNOSIS — Z888 Allergy status to other drugs, medicaments and biological substances status: Secondary | ICD-10-CM

## 2022-01-07 DIAGNOSIS — Z87891 Personal history of nicotine dependence: Secondary | ICD-10-CM | POA: Diagnosis not present

## 2022-01-07 DIAGNOSIS — F418 Other specified anxiety disorders: Secondary | ICD-10-CM | POA: Diagnosis present

## 2022-01-07 DIAGNOSIS — Z9981 Dependence on supplemental oxygen: Secondary | ICD-10-CM

## 2022-01-07 DIAGNOSIS — I110 Hypertensive heart disease with heart failure: Secondary | ICD-10-CM | POA: Diagnosis present

## 2022-01-07 DIAGNOSIS — H409 Unspecified glaucoma: Secondary | ICD-10-CM | POA: Diagnosis present

## 2022-01-07 DIAGNOSIS — Z6830 Body mass index (BMI) 30.0-30.9, adult: Secondary | ICD-10-CM

## 2022-01-07 DIAGNOSIS — D509 Iron deficiency anemia, unspecified: Secondary | ICD-10-CM | POA: Diagnosis present

## 2022-01-07 DIAGNOSIS — Z743 Need for continuous supervision: Secondary | ICD-10-CM | POA: Diagnosis not present

## 2022-01-07 DIAGNOSIS — E669 Obesity, unspecified: Secondary | ICD-10-CM | POA: Diagnosis present

## 2022-01-07 DIAGNOSIS — Z886 Allergy status to analgesic agent status: Secondary | ICD-10-CM

## 2022-01-07 DIAGNOSIS — E876 Hypokalemia: Secondary | ICD-10-CM | POA: Diagnosis not present

## 2022-01-07 DIAGNOSIS — Z79899 Other long term (current) drug therapy: Secondary | ICD-10-CM

## 2022-01-07 DIAGNOSIS — E785 Hyperlipidemia, unspecified: Secondary | ICD-10-CM | POA: Diagnosis present

## 2022-01-07 DIAGNOSIS — M19012 Primary osteoarthritis, left shoulder: Secondary | ICD-10-CM | POA: Diagnosis not present

## 2022-01-07 DIAGNOSIS — M19011 Primary osteoarthritis, right shoulder: Secondary | ICD-10-CM | POA: Diagnosis not present

## 2022-01-07 DIAGNOSIS — Z885 Allergy status to narcotic agent status: Secondary | ICD-10-CM

## 2022-01-07 DIAGNOSIS — M8580 Other specified disorders of bone density and structure, unspecified site: Secondary | ICD-10-CM | POA: Diagnosis present

## 2022-01-07 DIAGNOSIS — R69 Illness, unspecified: Secondary | ICD-10-CM | POA: Diagnosis not present

## 2022-01-07 DIAGNOSIS — K625 Hemorrhage of anus and rectum: Secondary | ICD-10-CM | POA: Diagnosis not present

## 2022-01-07 DIAGNOSIS — K5521 Angiodysplasia of colon with hemorrhage: Principal | ICD-10-CM | POA: Diagnosis present

## 2022-01-07 DIAGNOSIS — Z96653 Presence of artificial knee joint, bilateral: Secondary | ICD-10-CM | POA: Diagnosis present

## 2022-01-07 DIAGNOSIS — Z7983 Long term (current) use of bisphosphonates: Secondary | ICD-10-CM

## 2022-01-07 DIAGNOSIS — K449 Diaphragmatic hernia without obstruction or gangrene: Secondary | ICD-10-CM | POA: Diagnosis not present

## 2022-01-07 DIAGNOSIS — Z881 Allergy status to other antibiotic agents status: Secondary | ICD-10-CM

## 2022-01-07 DIAGNOSIS — R197 Diarrhea, unspecified: Secondary | ICD-10-CM | POA: Diagnosis not present

## 2022-01-07 DIAGNOSIS — I5032 Chronic diastolic (congestive) heart failure: Secondary | ICD-10-CM | POA: Diagnosis not present

## 2022-01-07 LAB — CBC WITH DIFFERENTIAL/PLATELET
Abs Immature Granulocytes: 0.03 10*3/uL (ref 0.00–0.07)
Basophils Absolute: 0.1 10*3/uL (ref 0.0–0.1)
Basophils Relative: 1 %
Eosinophils Absolute: 0.1 10*3/uL (ref 0.0–0.5)
Eosinophils Relative: 1 %
HCT: 29.5 % — ABNORMAL LOW (ref 36.0–46.0)
Hemoglobin: 9.6 g/dL — ABNORMAL LOW (ref 12.0–15.0)
Immature Granulocytes: 0 %
Lymphocytes Relative: 18 %
Lymphs Abs: 1.5 10*3/uL (ref 0.7–4.0)
MCH: 31.9 pg (ref 26.0–34.0)
MCHC: 32.5 g/dL (ref 30.0–36.0)
MCV: 98 fL (ref 80.0–100.0)
Monocytes Absolute: 0.7 10*3/uL (ref 0.1–1.0)
Monocytes Relative: 8 %
Neutro Abs: 6.1 10*3/uL (ref 1.7–7.7)
Neutrophils Relative %: 72 %
Platelets: 156 10*3/uL (ref 150–400)
RBC: 3.01 MIL/uL — ABNORMAL LOW (ref 3.87–5.11)
RDW: 14.4 % (ref 11.5–15.5)
WBC: 8.4 10*3/uL (ref 4.0–10.5)
nRBC: 0.2 % (ref 0.0–0.2)

## 2022-01-07 LAB — PROTIME-INR
INR: 1.1 (ref 0.8–1.2)
Prothrombin Time: 14.5 seconds (ref 11.4–15.2)

## 2022-01-07 LAB — COMPREHENSIVE METABOLIC PANEL
ALT: 14 U/L (ref 0–44)
AST: 19 U/L (ref 15–41)
Albumin: 3.5 g/dL (ref 3.5–5.0)
Alkaline Phosphatase: 48 U/L (ref 38–126)
Anion gap: 10 (ref 5–15)
BUN: 17 mg/dL (ref 8–23)
CO2: 23 mmol/L (ref 22–32)
Calcium: 8.6 mg/dL — ABNORMAL LOW (ref 8.9–10.3)
Chloride: 104 mmol/L (ref 98–111)
Creatinine, Ser: 0.59 mg/dL (ref 0.44–1.00)
GFR, Estimated: >60 mL/min (ref >60)
Glucose, Bld: 159 mg/dL — ABNORMAL HIGH (ref 70–99)
Potassium: 3.9 mmol/L (ref 3.5–5.1)
Sodium: 137 mmol/L (ref 135–145)
Total Bilirubin: 0.4 mg/dL (ref 0.3–1.2)
Total Protein: 6 g/dL — ABNORMAL LOW (ref 6.5–8.1)

## 2022-01-07 LAB — LACTIC ACID, PLASMA: Lactic Acid, Venous: 1.2 mmol/L (ref 0.5–1.9)

## 2022-01-07 LAB — POC OCCULT BLOOD, ED: Fecal Occult Bld: POSITIVE — AB

## 2022-01-07 MED ORDER — SODIUM CHLORIDE 0.9 % IV SOLN: INTRAVENOUS | Status: AC

## 2022-01-07 MED ORDER — ONDANSETRON HCL 4 MG PO TABS: 4.0000 mg | ORAL_TABLET | Freq: Four times a day (QID) | ORAL | Status: DC | PRN

## 2022-01-07 MED ORDER — ACETAMINOPHEN 325 MG PO TABS
650.0000 mg | ORAL_TABLET | Freq: Four times a day (QID) | ORAL | Status: DC | PRN
  Administered 2022-01-07 – 2022-01-11 (×2): 650 mg via ORAL
  Filled 2022-01-07 (×2): qty 2

## 2022-01-07 MED ORDER — ONDANSETRON HCL 4 MG/2ML IJ SOLN: 4.0000 mg | Freq: Four times a day (QID) | INTRAMUSCULAR | Status: DC | PRN

## 2022-01-07 MED ORDER — ACETAMINOPHEN 650 MG RE SUPP: 650.0000 mg | Freq: Four times a day (QID) | RECTAL | Status: DC | PRN

## 2022-01-07 MED ORDER — PANTOPRAZOLE SODIUM 40 MG IV SOLR
40.0000 mg | Freq: Two times a day (BID) | INTRAVENOUS | Status: DC
  Administered 2022-01-08 – 2022-01-11 (×8): 40 mg via INTRAVENOUS
  Filled 2022-01-07 (×8): qty 10

## 2022-01-07 NOTE — H&P (Signed)
History and Physical    [name] LKG:[id number] DOB: [birth year] DOA: 01/07/2022  PCP: [name], MD  Patient coming from: [name]  I have personally briefly reviewed patient's old medical records in [name]  Chief Complaint:  weakness/ blood in stool   HPI: [name] is a 81 y.o. 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 Patient returns to ED with close to one 1 week of dark stools and intermittent brbpr. Patient notes no associated n/v/or abdominal pain or cramping. She states however over the last two days she has noted increase fatigue and generalized weakness. She became more concerned today when episodes of bleeding became more frequent. Do to progression of symptoms patient presented to ED. Patient denies any sob/ fever/chills/ n/v/abdominal pain but does note abdominal fullness/bloating.  She denies presyncope.  ED Course:  Vitals: Afeb, bp 134/56, hr 86, rr 18, sat 95%  Wbc: 8.4, hgb 9.6 / pror 11.7 ( 1/23),plt 156,inr 1.1 N A 137, K 3.9, glu 159,  lactic 1.2 Fecal occult blot +  Review of Systems: As per HPI otherwise 10 point review of systems negative.   Past Medical History:  Diagnosis Date   Anxiety    Arthritis    "bilateral knees, shoulders, elbows; neck, pretty widespread" (09/05/2017)   BPPV (benign paroxysmal positional vertigo)    Depression    GERD (gastroesophageal reflux disease)    Glaucoma, both eyes    Headache    "probably 2/month" (09/05/2017)   History of blood transfusion ~ 2008   "related to LGIB"   Hyperlipemia    Lower GI bleeding ~ 2008; 09/05/2017   "had to have blood transfusion"   Macular degeneration, bilateral    Osteopenia    Seasonal allergies    Skin cancer, basal cell 2001    "off my nose, left side"   Sleeping excessive    Tinnitus of both ears     Past Surgical History:  Procedure Laterality Date   BALLOON DILATION N/A 05/08/2021   Procedure: BALLOON DILATION;  Surgeon: Ronnette Juniper, MD;  Location: [facility];  Service: Gastroenterology;  Laterality: N/A;   BASAL CELL CARCINOMA EXCISION  2001   "off my nose, left side"   BIOPSY  05/08/2021   Procedure: BIOPSY;  Surgeon: Ronnette Juniper, MD;  Location: [facility] ENDOSCOPY;  Service: Gastroenterology;;   BLEPHAROPLASTY Bilateral    CATARACT EXTRACTION W/ INTRAOCULAR LENS  IMPLANT, BILATERAL Bilateral 1990's   ESOPHAGOGASTRODUODENOSCOPY (EGD) WITH PROPOFOL N/A 05/08/2021   Procedure: ESOPHAGOGASTRODUODENOSCOPY (EGD) WITH PROPOFOL;  Surgeon: Ronnette Juniper, MD;  Location: [facility];  Service: Gastroenterology;  Laterality: N/A;   EYE SURGERY Bilateral    "to improve vision after cataract OR"   IR ANGIOGRAM FOLLOW UP STUDY  12/19/2020   IR ANGIOGRAM SELECTIVE EACH ADDITIONAL VESSEL  12/19/2020   IR ANGIOGRAM SELECTIVE EACH ADDITIONAL VESSEL  12/19/2020   IR ANGIOGRAM VISCERAL SELECTIVE  12/19/2020   IR EMBO ART  VEN HEMORR LYMPH EXTRAV  INC GUIDE ROADMAPPING  12/19/2020   IR US GUIDE VASC ACCESS RIGHT  12/19/2020   JOINT REPLACEMENT     STAPEDES SURGERY Left    "scraped stapedes because it was sticking when it wasn't suppose to"   TONSILLECTOMY AND ADENOIDECTOMY  1946   TOTAL KNEE ARTHROPLASTY Left ~  2008   TOTAL KNEE ARTHROPLASTY  12/20/2011   Procedure: TOTAL KNEE ARTHROPLASTY;  Surgeon: Augustin Schooling, MD;  Location: [place];  Service: Orthopedics;  Laterality: Right;  Right Total Knee Arthroplasty   TUBAL LIGATION  1980's     reports that she quit smoking about 29 years ago. She has a 27.75 pack-year smoking history. She has never used smokeless tobacco. She reports current alcohol use of about 7.0 standard drinks of alcohol per week. She reports that she does not use drugs.  Allergies  Allergen Reactions   Morphine  And Related Itching   Abaloparatide Other (See Comments)   Aspirin Other (See Comments)   Duloxetine Hcl Other (See Comments)   Ventolin [Albuterol]     Other reaction(s): rapid heart beat   Cefadroxil Hives    Patient can take amoxicillin and cipro    Family History  Problem Relation Age of Onset   Heart attack Mother    Heart disease Father    Heart failure Father    Bladder Cancer Father    Supraventricular tachycardia Sister    Hypertension Sister     Prior to Admission medications   Medication Sig Start Date End Date Taking? Authorizing Provider  acetaminophen (TYLENOL) 500 MG tablet Take 1,000 mg by mouth daily as needed for headache.    [provider]  albuterol (PROVENTIL HFA;VENTOLIN HFA) 108 (90 Base) MCG/ACT inhaler Inhale 2 puffs into the lungs daily as needed. For seasonal allergies . Use 2 puffs 3 times daily x 4 days then back to home regimen. Patient taking differently: Inhale 2 puffs into the lungs daily as needed for wheezing or shortness of breath. 08/10/17   Eugenie Filler, MD  Cholecalciferol (VITAMIN D3 PO) Take 1 tablet by mouth daily.    [provider]  dorzolamide-timolol (COSOPT) 22.3-6.8 MG/ML ophthalmic solution Place 1 drop into both eyes 2 (two) times daily.    [provider]  HYDROcodone-acetaminophen (NORCO/VICODIN) 5-325 MG tablet Take 1 tablet by mouth every 6 (six) hours as needed for moderate pain. 12/25/20   [provider]  latanoprost (XALATAN) 0.005 % ophthalmic solution Place 1 drop into both eyes at bedtime. 02/13/18   [provider]  loratadine (CLARITIN) 10 MG tablet Take 1 tablet (10 mg total) by mouth daily. 08/11/17   Eugenie Filler, MD  Multiple Vitamin (MULTIVITAMIN WITH MINERALS) TABS Take 1 tablet by mouth daily.    [provider]  OXYGEN 2lpm with sleep only  [employer]    [provider]  pantoprazole (PROTONIX) 40 MG tablet Take 1 tablet (40 mg total) by mouth 2  (two) times daily. 05/10/21 07/09/21  Bonnielee Haff, MD  predniSONE (DELTASONE) 10 MG tablet Take 2 tablets daily for 3 days and then take 1 tablet daily for 3 days, then stop. 05/10/21   Bonnielee Haff, MD  propranolol (INDERAL) 20 MG tablet Take 20 mg by mouth See admin instructions. 20 mg qd  20 mg  as needed for heartracing 04/30/21   [provider]  rOPINIRole (REQUIP) 0.5 MG tablet Take 1 tablet (0.5 mg total) by mouth at bedtime. Patient taking differently: Take 0.5 mg by mouth at bedtime as needed (restless legs). 12/23/20   Nita Sells, MD  sertraline (ZOLOFT) 100 MG tablet Take 1 tablet (100 mg total) by mouth daily. Patient taking differently: Take 150 mg by mouth daily. 12/23/20   Nita Sells, MD  simvastatin (ZOCOR) 20 MG tablet Take 20 mg by mouth daily  at 6 PM.    [provider]  vitamin C (ASCORBIC ACID) 500 MG tablet Take 500 mg by mouth daily.    [provider]    Physical Exam: Vitals:   01/07/22 2230 01/07/22 2245 01/07/22 2300 01/07/22 2315  BP: 107/83 115/67 (!) 113/51 (!) 115/53  Pulse: 85 88 75 80  Resp: '15 12 13 15  '$ Temp:      TempSrc:      SpO2: 97% 97% 96% 97%     Vitals:   01/07/22 2230 01/07/22 2245 01/07/22 2300 01/07/22 2315  BP: 107/83 115/67 (!) 113/51 (!) 115/53  Pulse: 85 88 75 80  Resp: '15 12 13 15  '$ Temp:      TempSrc:      SpO2: 97% 97% 96% 97%  Constitutional: NAD, calm, comfortable Eyes: PERRL, lids and conjunctivae normal ENMT: Mucous membranes are moist. Posterior pharynx clear of any exudate or lesions.Normal dentition.  Neck: normal, supple, no masses, no thyromegaly Respiratory: clear to auscultation bilaterally, no wheezing, no crackles. Normal respiratory effort. No accessory muscle use.  Cardiovascular: Regular rate and rhythm, no murmurs / rubs / gallops. Trace extremity edema. Extremities warm Abdomen: no tenderness, no masses palpated. No hepatosplenomegaly. Bowel sounds positive.  distended Musculoskeletal: no clubbing / cyanosis. No joint deformity upper and lower extremities. Good ROM, no contractures. Normal muscle tone.  Skin: no rashes, lesions, ulcers. No induration Neurologic: CN 2-12 grossly intact. Sensation intact, MAE x 4 Psychiatric: Normal judgment and insight. Alert and oriented x 3. Normal mood.    Labs on Admission: I have personally reviewed following labs and imaging studies  CBC: Recent Labs  Lab 01/07/22 1950  WBC 8.4  NEUTROABS 6.1  HGB 9.6*  HCT 29.5*  MCV 98.0  PLT 127   Basic Metabolic Panel: Recent Labs  Lab 01/07/22 1950  NA 137  K 3.9  CL 104  CO2 23  GLUCOSE 159*  BUN 17  CREATININE 0.59  CALCIUM 8.6*   GFR: CrCl cannot be calculated (Unknown ideal weight.). Liver Function Tests: Recent Labs  Lab 01/07/22 1950  AST 19  ALT 14  ALKPHOS 48  BILITOT 0.4  PROT 6.0*  ALBUMIN 3.5   No results for input(s): "LIPASE", "AMYLASE" in the last 168 hours. No results for input(s): "AMMONIA" in the last 168 hours. Coagulation Profile: Recent Labs  Lab 01/07/22 1950  INR 1.1   Cardiac Enzymes: No results for input(s): "CKTOTAL", "CKMB", "CKMBINDEX", "TROPONINI" in the last 168 hours. BNP (last 3 results) No results for input(s): "PROBNP" in the last 8760 hours. HbA1C: No results for input(s): "HGBA1C" in the last 72 hours. CBG: No results for input(s): "GLUCAP" in the last 168 hours. Lipid Profile: No results for input(s): "CHOL", "HDL", "LDLCALC", "TRIG", "CHOLHDL", "LDLDIRECT" in the last 72 hours. Thyroid Function Tests: No results for input(s): "TSH", "T4TOTAL", "FREET4", "T3FREE", "THYROIDAB" in the last 72 hours. Anemia Panel: No results for input(s): "VITAMINB12", "FOLATE", "FERRITIN", "TIBC", "IRON", "RETICCTPCT" in the last 72 hours. Urine analysis:    Component Value Date/Time   COLORURINE YELLOW 09/06/2017 0721   APPEARANCEUR HAZY (A) 09/06/2017 0721   LABSPEC 1.023 09/06/2017 0721   PHURINE 5.0  09/06/2017 0721   GLUCOSEU NEGATIVE 09/06/2017 0721   HGBUR NEGATIVE 09/06/2017 0721   BILIRUBINUR NEGATIVE 09/06/2017 0721   KETONESUR 20 (A) 09/06/2017 0721   PROTEINUR NEGATIVE 09/06/2017 0721   UROBILINOGEN 0.2 10/20/2007 2015   NITRITE POSITIVE (A) 09/06/2017 0721   LEUKOCYTESUR NEGATIVE 09/06/2017 5170  Radiological Exams on Admission: No results found.  EKG: Independently reviewed. 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  -no further episodes in ED -if patient re-bleeds will order CTA  - protonix iv bid  -cycle h/h  -Gi dr Tarri Glenn consulted will see in am  - gentle ivfs    COPD  -on nocturnal O2 -no acute exacerbation  -resume controller medication ,  Pulmonary nodules, -no active issues   HTN  -soft  -will hold anti HTN medications currently    Depression /anxiety  -resume home regimen as able    DVT prophylaxis: scd Code Status: DNR Family Communication: none at bedside Disposition Plan: patient  expected to be admitted greater than 2 midnights  Consults called: Dahlia Bailiff MD Admission status: progressive care    Clance Boll MD [facility]   If 7PM-7AM, please contact night-coverage [email or web address] Password [facility]  01/07/2022, 11:36 PM

## 2022-01-07 NOTE — ED Triage Notes (Signed)
Pt BIB EMS from [facility] [place] for dark tarry stool for 1 day. 6-7 episodes of diarrhea within the past 24 hours. Hx of a GI bleed. Pt complained of sharp RLQ this morning but 0/10 now.    132/74 92 NSR 94% RA

## 2022-01-08 ENCOUNTER — Other Ambulatory Visit: Payer: Self-pay

## 2022-01-08 ENCOUNTER — Encounter (HOSPITAL_COMMUNITY): Payer: Self-pay | Admitting: Internal Medicine

## 2022-01-08 DIAGNOSIS — K922 Gastrointestinal hemorrhage, unspecified: Secondary | ICD-10-CM | POA: Diagnosis not present

## 2022-01-08 LAB — CBC
HCT: 24.2 % — ABNORMAL LOW (ref 36.0–46.0)
Hemoglobin: 7.9 g/dL — ABNORMAL LOW (ref 12.0–15.0)
MCH: 31.7 pg (ref 26.0–34.0)
MCHC: 32.6 g/dL (ref 30.0–36.0)
MCV: 97.2 fL (ref 80.0–100.0)
Platelets: 127 10*3/uL — ABNORMAL LOW (ref 150–400)
RBC: 2.49 MIL/uL — ABNORMAL LOW (ref 3.87–5.11)
RDW: 14.6 % (ref 11.5–15.5)
WBC: 6.4 10*3/uL (ref 4.0–10.5)
nRBC: 0 % (ref 0.0–0.2)

## 2022-01-08 LAB — URINALYSIS, COMPLETE (UACMP) WITH MICROSCOPIC
Bilirubin Urine: NEGATIVE
Glucose, UA: NEGATIVE mg/dL
Hgb urine dipstick: NEGATIVE
Ketones, ur: NEGATIVE mg/dL
Nitrite: POSITIVE — AB
Protein, ur: NEGATIVE mg/dL
Specific Gravity, Urine: 1.013 (ref 1.005–1.030)
pH: 5 (ref 5.0–8.0)

## 2022-01-08 LAB — COMPREHENSIVE METABOLIC PANEL
ALT: 13 U/L (ref 0–44)
AST: 16 U/L (ref 15–41)
Albumin: 3 g/dL — ABNORMAL LOW (ref 3.5–5.0)
Alkaline Phosphatase: 36 U/L — ABNORMAL LOW (ref 38–126)
Anion gap: 14 (ref 5–15)
BUN: 16 mg/dL (ref 8–23)
CO2: 25 mmol/L (ref 22–32)
Calcium: 8.4 mg/dL — ABNORMAL LOW (ref 8.9–10.3)
Chloride: 104 mmol/L (ref 98–111)
Creatinine, Ser: 0.63 mg/dL (ref 0.44–1.00)
GFR, Estimated: >60 mL/min (ref >60)
Glucose, Bld: 117 mg/dL — ABNORMAL HIGH (ref 70–99)
Potassium: 3.8 mmol/L (ref 3.5–5.1)
Sodium: 143 mmol/L (ref 135–145)
Total Bilirubin: 0.4 mg/dL (ref 0.3–1.2)
Total Protein: 5 g/dL — ABNORMAL LOW (ref 6.5–8.1)

## 2022-01-08 LAB — PROTIME-INR
INR: 1.2 (ref 0.8–1.2)
Prothrombin Time: 15.3 seconds — ABNORMAL HIGH (ref 11.4–15.2)

## 2022-01-08 LAB — HEMOGLOBIN: Hemoglobin: 8.7 g/dL — ABNORMAL LOW (ref 12.0–15.0)

## 2022-01-08 LAB — CBG MONITORING, ED: Glucose-Capillary: 113 mg/dL — ABNORMAL HIGH (ref 70–99)

## 2022-01-08 MED ORDER — LATANOPROST 0.005 % OP SOLN
1.0000 [drp] | Freq: Every day | OPHTHALMIC | Status: DC
  Administered 2022-01-08 – 2022-01-13 (×6): 1 [drp] via OPHTHALMIC
  Filled 2022-01-08: qty 2.5

## 2022-01-08 MED ORDER — ROPINIROLE HCL 1 MG PO TABS
1.0000 mg | ORAL_TABLET | Freq: Every evening | ORAL | Status: DC | PRN
  Administered 2022-01-08 – 2022-01-13 (×5): 1 mg via ORAL
  Filled 2022-01-08 (×5): qty 1

## 2022-01-08 MED ORDER — UMECLIDINIUM-VILANTEROL 62.5-25 MCG/ACT IN AEPB
1.0000 | INHALATION_SPRAY | Freq: Every day | RESPIRATORY_TRACT | Status: DC
Start: 2022-01-09 — End: 2022-01-14
  Administered 2022-01-09 – 2022-01-13 (×5): 1 via RESPIRATORY_TRACT
  Filled 2022-01-08: qty 14

## 2022-01-08 MED ORDER — DORZOLAMIDE HCL-TIMOLOL MAL 2-0.5 % OP SOLN
1.0000 [drp] | Freq: Two times a day (BID) | OPHTHALMIC | Status: DC
  Administered 2022-01-08 – 2022-01-14 (×9): 1 [drp] via OPHTHALMIC
  Filled 2022-01-08: qty 10

## 2022-01-08 NOTE — ED Notes (Signed)
Assisted pt with putting her glasses and hearing aids back on

## 2022-01-08 NOTE — ED Notes (Signed)
Admitting paged about drop in Pt's hemoglobin

## 2022-01-08 NOTE — ED Notes (Signed)
Admitting paged about decrease in pt's hemoglobin from 8.7 @ 2337 to 7.9 @ 0500

## 2022-01-08 NOTE — Progress Notes (Signed)
PROGRESS NOTE    SHAQUILA SIGMAN  DTO:[id number] DOB: 11-15-1940 DOA: 01/07/2022 PCP: [name], MD   Brief Narrative:  HPI: [name] is a 81 y.o. 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 Patient returns to ED with close to 1 week of dark stools and intermittent brbpr. Patient notes no associated n/v/or abdominal pain or cramping. She states however. over the last two days she has noted increase fatigue and generalized weakness. She became more concerned today when episodes of bleeding became more frequent. Due to progression of symptoms patient presented to ED. Patient denies any sob/ fever/chills/ n/v/abdominal pain but does note abdominal fullness/bloating.  She denies presyncope.   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  Patient complains of melena but cannot confirm bright red blood per rectum, FOBT positive.  Hemoglobin slightly dropped from what it was before but no indication of transfusion.  Monitor closely.  Continue twice daily Protonix.  GI to see.  Continue NPO.   COPD  -on nocturnal O2 -no acute exacerbation  -We will resume home inhalers.   Pulmonary nodules, -no active issues    HTN  -soft  -will  continue to hold anti HTN medications currently     Depression /anxiety  -resume home regimen once cleared to have p.o. by GI.  DVT prophylaxis: SCDs Start: 01/07/22 2332   Code Status: DNR  Family Communication:  None present at bedside.  Plan of care discussed with patient in length and he/she verbalized understanding and agreed with it.  Status is: Inpatient Remains inpatient appropriate because: To be assessed by GI and potential need of EGD   Estimated body mass index is 30.73 kg/m as calculated from the following:   Height as of this encounter: '5\' 4"'$  (1.626 m).   Weight as of this encounter: 81.2 kg.  Pressure Injury 12/20/20 Elbow Anterior;Right (Active)  12/20/20 1300  Location: Elbow  Location Orientation: Anterior;Right  Staging:   Wound Description (Comments):   Present on Admission:    Nutritional Assessment: Body mass index is 30.73 kg/m.Marland Kitchen Seen by dietician.  I agree with the assessment and plan as outlined below: Nutrition Status:        . Skin Assessment: I have examined the patient's skin and I agree with the wound assessment as performed by the wound care RN as outlined below: Pressure Injury 12/20/20 Elbow Anterior;Right (Active)  12/20/20 1300  Location: Elbow  Location Orientation: Anterior;Right  Staging:   Wound Description (Comments):   Present on Admission:     Consultants:  GI  Procedures:  As above  Antimicrobials:  Anti-infectives (From admission, onward)    None  Subjective: Seen and examined in the ED.  Patient's only complaint is that she is thirsty and hungry and wants to eat.  She is upset about being n.p.o.  I did explain to her the rationale behind that.  She understands that.  No other complaint.  Objective: Vitals:   01/08/22 0830 01/08/22 0900 01/08/22 0930 01/08/22 1000  BP: (!) 115/51 (!) 101/50 (!) 106/42 (!) 112/57  Pulse: 66 66 72 77  Resp: '16 16 16 18  '$ Temp:      TempSrc:      SpO2: 96% 97% 97%  96%  Weight:      Height:        Intake/Output Summary (Last 24 hours) at 01/08/2022 1044 Last data filed at 01/08/2022 0940 Gross per 24 hour  Intake 381.11 ml  Output --  Net 381.11 ml   Filed Weights   01/08/22 0749  Weight: 81.2 kg    Examination:  General exam: Appears calm and comfortable  Respiratory system: Clear to auscultation. Respiratory effort normal. Cardiovascular system: S1 & S2 heard, RRR. No JVD, murmurs, rubs, gallops or clicks. No pedal edema. Gastrointestinal system: Abdomen is nondistended, soft and nontender. No organomegaly or masses felt. Normal bowel sounds heard. Central nervous system: Alert and oriented. No focal neurological deficits. Extremities: Symmetric 5 x 5 power. Skin: No rashes, lesions or ulcers Psychiatry: Judgement and insight appear normal. Mood & affect appropriate.    Data Reviewed: I have personally reviewed following labs and imaging studies  CBC: Recent Labs  Lab 01/07/22 1950 01/07/22 2334 01/08/22 0500  WBC 8.4  --  6.4  NEUTROABS 6.1  --   --   HGB 9.6* 8.7* 7.9*  HCT 29.5*  --  24.2*  MCV 98.0  --  97.2  PLT 156  --  154*   Basic Metabolic Panel: Recent Labs  Lab 01/07/22 1950 01/08/22 0500  NA 137 143  K 3.9 3.8  CL 104 104  CO2 23 25  GLUCOSE 159* 117*  BUN 17 16  CREATININE 0.59 0.63  CALCIUM 8.6* 8.4*   GFR: Estimated Creatinine Clearance: 54.9 mL/min (by C-G formula based on SCr of 0.63 mg/dL). Liver Function Tests: Recent Labs  Lab 01/07/22 1950 01/08/22 0500  AST 19 16  ALT 14 13  ALKPHOS 48 36*  BILITOT 0.4 0.4  PROT 6.0* 5.0*  ALBUMIN 3.5 3.0*   No results for input(s): "LIPASE", "AMYLASE" in the last 168 hours. No results for input(s): "AMMONIA" in the last 168 hours. Coagulation Profile: Recent Labs  Lab 01/07/22 1950 01/08/22 0500  INR 1.1 1.2   Cardiac Enzymes: No results for input(s): "CKTOTAL", "CKMB", "CKMBINDEX", "TROPONINI" in the last 168 hours. BNP (last 3  results) No results for input(s): "PROBNP" in the last 8760 hours. HbA1C: No results for input(s): "HGBA1C" in the last 72 hours. CBG: Recent Labs  Lab 01/08/22 0743  GLUCAP 113*   Lipid Profile: No results for input(s): "CHOL", "HDL", "LDLCALC", "TRIG", "CHOLHDL", "LDLDIRECT" in the last 72 hours. Thyroid Function Tests: No results for input(s): "TSH", "T4TOTAL", "FREET4", "T3FREE", "THYROIDAB" in the last 72 hours. Anemia Panel: No results for input(s): "VITAMINB12", "FOLATE", "FERRITIN", "TIBC", "IRON", "RETICCTPCT" in the last 72 hours. Sepsis Labs: Recent Labs  Lab 01/07/22 2016  LATICACIDVEN 1.2    No results found for this or any previous visit (from the past 240 hour(s)).   Radiology Studies: No results found.  Scheduled Meds:  pantoprazole (PROTONIX) IV  40 mg Intravenous Q12H  Continuous Infusions:  sodium chloride 50 mL/hr at 01/08/22 0940     LOS: 1 day   Darliss Cheney, MD [facility]  01/08/2022, 10:44 AM   *Please note that this is a verbal dictation therefore any spelling or grammatical errors are due to the "[place] [facility]" system interpretation.  Please page via [place] and do not message via secure chat for urgent patient care matters. Secure chat can be used for non urgent patient care matters.  How to contact the [facility] Attending or Consulting provider [place] or covering provider during after hours [place], for this patient?  Check the care team in [facility] and look for a) attending/consulting [facility] provider listed and b) the [facility] team listed. Page or secure chat [phone]. Log into [email or web address] and use [place] universal password to access. If you do not have the password, please contact the hospital operator. Locate the [facility] provider you are looking for under [facility] and page to a number that you can be directly reached. If you still have difficulty reaching the provider, please page the [facility] (Director on Call) for the Hospitalists  listed on amion for assistance.

## 2022-01-08 NOTE — Progress Notes (Signed)
[name] is alert and oriented x4. Connected to telemetry. Vitals taken. Oriented to room, call light and bed controls.

## 2022-01-08 NOTE — ED Provider Notes (Signed)
[facility] EMERGENCY DEPARTMENT Provider Note   CSN: [id number] Arrival date & time: 01/07/22  1931     History {Add pertinent medical, surgical, social history, OB history to HPI:1} Chief Complaint  Patient presents with   GI Problem    [name] is a 81 y.o. female.  HPI     81 year old female comes in with chief complaint of bleeding.  Home Medications Prior to Admission medications   Medication Sig Start Date End Date Taking? Authorizing Provider  acetaminophen (TYLENOL) 500 MG tablet Take 1,000 mg by mouth daily as needed for headache.    [provider]  albuterol (PROVENTIL HFA;VENTOLIN HFA) 108 (90 Base) MCG/ACT inhaler Inhale 2 puffs into the lungs daily as needed. For seasonal allergies . Use 2 puffs 3 times daily x 4 days then back to home regimen. Patient taking differently: Inhale 2 puffs into the lungs daily as needed for wheezing or shortness of breath. 08/10/17   Eugenie Filler, MD  Cholecalciferol (VITAMIN D3 PO) Take 1 tablet by mouth daily.    [provider]  dorzolamide-timolol (COSOPT) 22.3-6.8 MG/ML ophthalmic solution Place 1 drop into both eyes 2 (two) times daily.    [provider]  HYDROcodone-acetaminophen (NORCO/VICODIN) 5-325 MG tablet Take 1 tablet by mouth every 6 (six) hours as needed for moderate pain. 12/25/20   [provider]  latanoprost (XALATAN) 0.005 % ophthalmic solution Place 1 drop into both eyes at bedtime. 02/13/18   [provider]  loratadine (CLARITIN) 10 MG tablet Take 1 tablet (10 mg total) by mouth daily. 08/11/17   Eugenie Filler, MD  Multiple Vitamin (MULTIVITAMIN WITH MINERALS) TABS Take 1 tablet by mouth daily.    [provider]  OXYGEN 2lpm with sleep only  [facility]    [provider]  pantoprazole (PROTONIX) 40 MG tablet Take 1 tablet (40 mg total) by mouth 2 (two) times daily. 05/10/21 07/09/21  Bonnielee Haff, MD  predniSONE  (DELTASONE) 10 MG tablet Take 2 tablets daily for 3 days and then take 1 tablet daily for 3 days, then stop. 05/10/21   Bonnielee Haff, MD  propranolol (INDERAL) 20 MG tablet Take 20 mg by mouth See admin instructions. 20 mg qd  20 mg  as needed for heartracing 04/30/21   [provider]  rOPINIRole (REQUIP) 0.5 MG tablet Take 1 tablet (0.5 mg total) by mouth at bedtime. Patient taking differently: Take 0.5 mg by mouth at bedtime as needed (restless legs). 12/23/20   Nita Sells, MD  sertraline (ZOLOFT) 100 MG tablet Take 1 tablet (100 mg total) by mouth daily. Patient taking differently: Take 150 mg by mouth daily. 12/23/20   Nita Sells, MD  simvastatin (ZOCOR) 20 MG tablet Take 20 mg by mouth daily at 6 PM.    [provider]  vitamin C (ASCORBIC ACID) 500 MG tablet Take 500 mg by mouth daily.    [provider]      Allergies    Morphine and related, Abaloparatide, Aspirin, Duloxetine hcl, Ventolin [albuterol], and Cefadroxil    Review of Systems   Review of Systems  Physical Exam Updated Vital Signs BP (!) 115/53   Pulse 80   Temp 98 F (36.7 C) (Oral)   Resp 15   SpO2 97%  Physical Exam  ED Results / Procedures / Treatments   Labs (all labs ordered are listed, but only abnormal results are displayed) Labs Reviewed  COMPREHENSIVE METABOLIC PANEL - Abnormal; Notable  for the following components:      Result Value   Glucose, Bld 159 (*)    Calcium 8.6 (*)    Total Protein 6.0 (*)    All other components within normal limits  CBC WITH DIFFERENTIAL/PLATELET - Abnormal; Notable for the following components:   RBC 3.01 (*)    Hemoglobin 9.6 (*)    HCT 29.5 (*)    All other components within normal limits  POC OCCULT BLOOD, ED - Abnormal; Notable for the following components:   Fecal Occult Bld POSITIVE (*)    All other components within normal limits  PROTIME-INR  LACTIC ACID, PLASMA  HEMOGLOBIN  HEMOGLOBIN  URINALYSIS,  COMPLETE (UACMP) WITH MICROSCOPIC  CBC  PROTIME-INR  COMPREHENSIVE METABOLIC PANEL  HEMOGLOBIN  HEMOGLOBIN  TYPE AND SCREEN    EKG None  Radiology No results found.  Procedures Procedures  {Document cardiac monitor, telemetry assessment procedure when appropriate:1}  Medications Ordered in ED Medications  0.9 %  sodium chloride infusion (has no administration in time range)  ondansetron (ZOFRAN) tablet 4 mg (has no administration in time range)    Or  ondansetron (ZOFRAN) injection 4 mg (has no administration in time range)  acetaminophen (TYLENOL) tablet 650 mg (650 mg Oral Given 01/07/22 2354)    Or  acetaminophen (TYLENOL) suppository 650 mg ( Rectal See Alternative 01/07/22 2354)  pantoprazole (PROTONIX) injection 40 mg (has no administration in time range)    ED Course/ Medical Decision Making/ A&P                           Medical Decision Making Amount and/or Complexity of Data Reviewed Labs: ordered.  Risk Decision regarding hospitalization.   ***  {Document critical care time when appropriate:1} {Document review of labs and clinical decision tools ie heart score, Chads2Vasc2 etc:1}  {Document your independent review of radiology images, and any outside records:1} {Document your discussion with family members, caretakers, and with consultants:1} {Document social determinants of health affecting pt's care:1} {Document your decision making why or why not admission, treatments were needed:1} Final Clinical Impression(s) / ED Diagnoses Final diagnoses:  None    Rx / DC Orders ED Discharge Orders     None

## 2022-01-08 NOTE — Consult Note (Addendum)
Referring Provider: [facility] Primary Care Physician:  [name], MD Primary Gastroenterologist:  Dr. Paulita Fujita  Reason for Consultation:  Melena  HPI: [name] is a 81 y.o. 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 States she had one episode of regular colored diarrhea last week. Then noticed 3 days ago she had darker stools, denies bright red blood though reports she has difficulty seeing colors. States last night she had dark loose stools. Denies abdominal pain. Denies nausea/vomiting. Denies weight loss. Denies reflux. Denies dysphagia. Reports some fatigue. Denies family history of colon cancer. Denies pepto/iron use.  EGD 05/2021 done for dysphagia at [facility]: esophageal plaques suspicous for candidiasis (positive biopsies) treated with diflucan, multiple gastric polyps, normal duodenum.  Last colonoscopy 2005 at [facility]. Unable to view report  ECHO 05/2021: 60-65%  Past Medical History:  Diagnosis Date   Anxiety    Arthritis    "bilateral knees, shoulders, elbows; neck, pretty widespread" (09/05/2017)   BPPV (benign paroxysmal positional vertigo)    Depression    GERD (gastroesophageal reflux disease)    Glaucoma, both eyes    Headache    "probably 2/month" (09/05/2017)   History of blood transfusion ~ 2008   "related to LGIB"   Hyperlipemia    Lower GI bleeding ~ 2008; 09/05/2017   "had to have blood transfusion"   Macular degeneration, bilateral    Osteopenia    Seasonal allergies    Skin cancer, basal cell 2001   "off my nose, left side"   Sleeping excessive    Tinnitus of both ears     Past Surgical History:  Procedure Laterality Date   BALLOON DILATION N/A  05/08/2021   Procedure: BALLOON DILATION;  Surgeon: Ronnette Juniper, MD;  Location: [place];  Service: Gastroenterology;  Laterality: N/A;   BASAL CELL CARCINOMA EXCISION  2001   "off my nose, left side"   BIOPSY  05/08/2021   Procedure: BIOPSY;  Surgeon: Ronnette Juniper, MD;  Location: [facility] ENDOSCOPY;  Service: Gastroenterology;;   BLEPHAROPLASTY Bilateral    CATARACT EXTRACTION W/ INTRAOCULAR LENS  IMPLANT, BILATERAL Bilateral 1990's   ESOPHAGOGASTRODUODENOSCOPY (EGD) WITH PROPOFOL N/A 05/08/2021   Procedure: ESOPHAGOGASTRODUODENOSCOPY (EGD) WITH PROPOFOL;  Surgeon: Ronnette Juniper, MD;  Location: [place];  Service: Gastroenterology;  Laterality: N/A;   EYE SURGERY Bilateral    "to improve vision after cataract OR"   IR ANGIOGRAM FOLLOW UP STUDY  12/19/2020   IR ANGIOGRAM SELECTIVE EACH ADDITIONAL VESSEL  12/19/2020   IR ANGIOGRAM SELECTIVE EACH ADDITIONAL VESSEL  12/19/2020   IR ANGIOGRAM VISCERAL SELECTIVE  12/19/2020   IR EMBO ART  VEN HEMORR LYMPH EXTRAV  INC GUIDE ROADMAPPING  12/19/2020   IR US GUIDE VASC ACCESS RIGHT  12/19/2020   JOINT REPLACEMENT     STAPEDES SURGERY Left    "scraped stapedes because it was sticking when it wasn't suppose to"   [place] Left ~ 2008   TOTAL KNEE ARTHROPLASTY  12/20/2011   Procedure: TOTAL KNEE ARTHROPLASTY;  Surgeon: Augustin Schooling, MD;  Location: [place];  Service: Orthopedics;  Laterality: Right;  Right Total Knee Arthroplasty   TUBAL LIGATION  1980's    Prior to  Admission medications   Medication Sig Start Date End Date Taking? Authorizing Provider  acetaminophen (TYLENOL) 500 MG tablet Take 1,000 mg by mouth 2 (two) times daily.   Yes [provider]  albuterol (PROVENTIL HFA;VENTOLIN HFA) 108 (90 Base) MCG/ACT inhaler Inhale 2 puffs into the lungs daily as needed. For seasonal allergies . Use 2 puffs 3 times daily x 4 days then back to home regimen. Patient taking differently: Inhale 2  puffs into the lungs every 4 (four) hours as needed for wheezing or shortness of breath. 08/10/17  Yes Eugenie Filler, MD  alendronate (FOSAMAX) 70 MG tablet Take 70 mg by mouth every Sunday. Take with a full glass of water on an empty stomach.   Yes [provider]  Ascorbic Acid (VITAMIN C) 1000 MG tablet Take 1,000 mg by mouth every morning.   Yes [provider]  Cholecalciferol (VITAMIN D3) 50 MCG (2000 UT) TABS Take 2,000 Units by mouth every morning.   Yes [provider]  docusate sodium (COLACE) 100 MG capsule Take 200 mg by mouth at bedtime.   Yes [provider]  dorzolamide-timolol (COSOPT) 22.3-6.8 MG/ML ophthalmic solution Place 1 drop into both eyes 2 (two) times daily.   Yes [provider]  ferrous gluconate (FERGON) 240 (27 FE) MG tablet Take 240 mg by mouth every morning. Take without food   Yes [provider]  HYDROcodone-acetaminophen (NORCO/VICODIN) 5-325 MG tablet Take 1 tablet by mouth every 6 (six) hours as needed (pain). 12/25/20  Yes [provider]  latanoprost (XALATAN) 0.005 % ophthalmic solution Place 1 drop into both eyes at bedtime. 02/13/18  Yes [provider]  loratadine (CLARITIN) 10 MG tablet Take 1 tablet (10 mg total) by mouth daily. Patient taking differently: Take 10 mg by mouth daily as needed for allergies, rhinitis or itching. 08/11/17  Yes Eugenie Filler, MD  Multiple Vitamin-Folic Acid TABS Take 1 tablet by mouth every morning. Multivitamin with 474 mcg folic acid   Yes [provider]  Multiple Vitamins-Minerals (PRESERVISION AREDS 2) CAPS Take 1 capsule by mouth 2 (two) times daily.   Yes [provider]  Netarsudil Dimesylate (RHOPRESSA) 0.02 % SOLN Place 1 drop into both eyes at bedtime.   Yes [provider]  pantoprazole (PROTONIX) 40 MG tablet Take 40 mg by mouth 2 (two) times daily.   Yes [provider]  polyethylene glycol (MIRALAX /  GLYCOLAX) 17 g packet Take 8.5 g by mouth daily as needed (constipation).   Yes [provider]  Probiotic Product (ALIGN) 4 MG CAPS Take 4 mg by mouth at bedtime.   Yes [provider]  propranolol (INDERAL) 20 MG tablet Take 20 mg by mouth See admin instructions. Take one tablet (20 mg) by mouth daily at bedtime, may also take one tablet (20 mg) daily as needed for pulse over 120 04/30/21  Yes [provider]  rOPINIRole (REQUIP) 1 MG tablet Take 1 mg by mouth at bedtime as needed (restless legs/tremors).   Yes [provider]  sertraline (ZOLOFT) 100 MG tablet Take 1 tablet (100 mg total) by mouth daily. Patient taking differently: Take 100 mg by mouth at bedtime. 12/23/20  Yes Nita Sells, MD  simvastatin (ZOCOR) 20 MG tablet Take 20 mg by mouth every evening.   Yes [provider]  Sodium Fluoride (PREVIDENT 5000 BOOSTER PLUS) 1.1 % PSTE Place 1 Application onto teeth See admin instructions. Brush on teeth with a toothbrush after evening  mouth care. Spit out excess and do not rinse.   Yes [provider]  umeclidinium-vilanterol (ANORO ELLIPTA) 62.5-25 MCG/ACT AEPB Inhale 1 puff into the lungs every morning.   Yes [provider]  zinc oxide 20 % ointment Apply 1 Application topically See admin instructions. Apply topically to buttocks after every incontinent episode and as needed for redness   Yes [provider]  OXYGEN 2lpm with sleep only  [facility]    [provider]    Scheduled Meds:  pantoprazole (PROTONIX) IV  40 mg Intravenous Q12H   Continuous Infusions:  sodium chloride 50 mL/hr at 01/08/22 0121   PRN Meds:.acetaminophen **OR** acetaminophen, ondansetron **OR** ondansetron (ZOFRAN) IV  Allergies as of 01/07/2022 - Review Complete 01/07/2022  Allergen Reaction Noted   Morphine and related Itching 11/29/2011   Abaloparatide Other (See Comments) 10/22/2020   Aspirin Other (See Comments)  10/22/2020   Duloxetine hcl Other (See Comments) 10/22/2020   Ventolin [albuterol]  12/25/2020   Cefadroxil Hives 12/11/2011    Family History  Problem Relation Age of Onset   Heart attack Mother    Heart disease Father    Heart failure Father    Bladder Cancer Father    Supraventricular tachycardia Sister    Hypertension Sister     Social History   Socioeconomic History   Marital status: Married    Spouse name: Clair Gulling    Number of children: 3   Years of education: College   Highest education level: Not on file  Occupational History   Occupation: Retired Pharmacist, hospital  Tobacco Use   Smoking status: Former    Packs/day: 0.75    Years: 37.00    Total pack years: 27.75    Types: Cigarettes    Quit date: 07/01/1992    Years since quitting: 29.5   Smokeless tobacco: Never  Vaping Use   Vaping Use: Never used  Substance and Sexual Activity   Alcohol use: Yes    Alcohol/week: 7.0 standard drinks of alcohol    Types: 7 Glasses of wine per week    Comment: 09/05/2017  "6 oz wine, 5 days/wk"   Drug use: No    Types: Hydrocodone   Sexual activity: Not Currently  Other Topics Concern   Not on file  Social History Narrative   1-2 cups of coffee a day    Social Determinants of Health   Financial Resource Strain: Not on file  Food Insecurity: Not on file  Transportation Needs: Not on file  Physical Activity: Not on file  Stress: Not on file  Social Connections: Not on file  Intimate Partner Violence: Not on file    Review of Systems: Review of Systems  Constitutional:  Negative for chills, fever and weight loss.  HENT:  Negative for hearing loss and tinnitus.   Eyes:  Negative for blurred vision and double vision.  Respiratory:  Negative for cough and hemoptysis.   Cardiovascular:  Negative for chest pain and palpitations.  Gastrointestinal:  Positive for diarrhea and melena. Negative for abdominal pain, blood in stool, constipation, heartburn, nausea and vomiting.   Genitourinary:  Negative for dysuria.  Musculoskeletal:  Negative for myalgias and neck pain.  Skin:  Negative for itching and rash.  Neurological:  Negative for seizures and loss of consciousness.  Psychiatric/Behavioral:  Negative for depression and suicidal ideas.      Physical Exam:Physical Exam Constitutional:      Appearance: Normal appearance.  HENT:     Head: Normocephalic and atraumatic.  Nose: Nose normal. No congestion.     Mouth/Throat:     Mouth: Mucous membranes are moist.     Pharynx: Oropharynx is clear.  Eyes:     Extraocular Movements: Extraocular movements intact.     Comments: Conjunctival pallor  Cardiovascular:     Rate and Rhythm: Normal rate and regular rhythm.  Pulmonary:     Effort: Pulmonary effort is normal. No respiratory distress.  Abdominal:     General: Bowel sounds are normal. There is no distension.     Palpations: Abdomen is soft. There is no mass.     Tenderness: There is no abdominal tenderness. There is no guarding or rebound.     Hernia: No hernia is present.  Musculoskeletal:        General: No swelling. Normal range of motion.     Cervical back: Normal range of motion and neck supple.  Skin:    General: Skin is warm and dry.  Neurological:     General: No focal deficit present.     Mental Status: She is alert and oriented to person, place, and time.  Psychiatric:        Mood and Affect: Mood normal.        Behavior: Behavior normal.        Thought Content: Thought content normal.        Judgment: Judgment normal.     Vital signs: Vitals:   01/08/22 0800 01/08/22 0830  BP: (!) 114/50 (!) 115/51  Pulse: 69 66  Resp: 16 16  Temp:    SpO2: 99% 96%        GI:  Lab Results: Recent Labs    01/07/22 1950 01/07/22 2334 01/08/22 0500  WBC 8.4  --  6.4  HGB 9.6* 8.7* 7.9*  HCT 29.5*  --  24.2*  PLT 156  --  127*   BMET Recent Labs    01/07/22 1950 01/08/22 0500  NA 137 143  K 3.9 3.8  CL 104 104  CO2 23 25   GLUCOSE 159* 117*  BUN 17 16  CREATININE 0.59 0.63  CALCIUM 8.6* 8.4*   LFT Recent Labs    01/08/22 0500  PROT 5.0*  ALBUMIN 3.0*  AST 16  ALT 13  ALKPHOS 36*  BILITOT 0.4   PT/INR Recent Labs    01/07/22 1950 01/08/22 0500  LABPROT 14.5 15.3*  INR 1.1 1.2     Studies/Results: No results found.  Impression: Acute on chronic anemia, melena -Hgb 7.9 (9.6 7/10).  Hgb was 11.7 07/2021 -BUN 16, creatinine 0.63 -Positive fecal occult (multiple fecal occult's positive in the past) -Last EGD 05/2021 showed esophageal plaques suspicous for candidiasis (positive biopsies) treated with diflucan, multiple gastric polyps, normal duodenum.  Diastolic heart failure -Echo 2022 shows ejection fraction 60 to 65%  Plan: Acute on chronic anemia with melena, though BUN is normal.  With normal BUN, less likely to be upper GI bleed. Recommend medical management at this time and if worsening bleeding or worsening anemia can consider repeat EGD for further evaluation Protonix 40 mg twice daily Continue supportive care Continue daily CBC and transfuse as needed to maintain HGB > 7  [facility] will follow    LOS: 1 day   Garnette Scheuermann  PA-C 01/08/2022, 8:48 AM  Contact #  [phone]

## 2022-01-09 DIAGNOSIS — K922 Gastrointestinal hemorrhage, unspecified: Secondary | ICD-10-CM | POA: Diagnosis not present

## 2022-01-09 LAB — BASIC METABOLIC PANEL
Anion gap: 8 (ref 5–15)
BUN: 10 mg/dL (ref 8–23)
CO2: 23 mmol/L (ref 22–32)
Calcium: 8.4 mg/dL — ABNORMAL LOW (ref 8.9–10.3)
Chloride: 111 mmol/L (ref 98–111)
Creatinine, Ser: 0.47 mg/dL (ref 0.44–1.00)
GFR, Estimated: >60 mL/min (ref >60)
Glucose, Bld: 111 mg/dL — ABNORMAL HIGH (ref 70–99)
Potassium: 3.5 mmol/L (ref 3.5–5.1)
Sodium: 142 mmol/L (ref 135–145)

## 2022-01-09 LAB — CBC WITH DIFFERENTIAL/PLATELET
Abs Immature Granulocytes: 0.06 10*3/uL (ref 0.00–0.07)
Basophils Absolute: 0 10*3/uL (ref 0.0–0.1)
Basophils Relative: 0 %
Eosinophils Absolute: 0.1 10*3/uL (ref 0.0–0.5)
Eosinophils Relative: 2 %
HCT: 25 % — ABNORMAL LOW (ref 36.0–46.0)
Hemoglobin: 8.2 g/dL — ABNORMAL LOW (ref 12.0–15.0)
Immature Granulocytes: 1 %
Lymphocytes Relative: 17 %
Lymphs Abs: 1.4 10*3/uL (ref 0.7–4.0)
MCH: 32.2 pg (ref 26.0–34.0)
MCHC: 32.8 g/dL (ref 30.0–36.0)
MCV: 98 fL (ref 80.0–100.0)
Monocytes Absolute: 0.9 10*3/uL (ref 0.1–1.0)
Monocytes Relative: 12 %
Neutro Abs: 5.4 10*3/uL (ref 1.7–7.7)
Neutrophils Relative %: 68 %
Platelets: 141 10*3/uL — ABNORMAL LOW (ref 150–400)
RBC: 2.55 MIL/uL — ABNORMAL LOW (ref 3.87–5.11)
RDW: 14.9 % (ref 11.5–15.5)
WBC: 7.9 10*3/uL (ref 4.0–10.5)
nRBC: 0 % (ref 0.0–0.2)

## 2022-01-09 LAB — GLUCOSE, CAPILLARY: Glucose-Capillary: 121 mg/dL — ABNORMAL HIGH (ref 70–99)

## 2022-01-09 MED ORDER — SODIUM FLUORIDE 1.1 % DT PSTE
1.0000 | PASTE | DENTAL | Status: DC
Start: 2022-01-09 — End: 2022-01-09

## 2022-01-09 MED ORDER — FERROUS GLUCONATE 324 (38 FE) MG PO TABS
324.0000 mg | ORAL_TABLET | Freq: Every morning | ORAL | Status: DC
  Administered 2022-01-09 – 2022-01-14 (×6): 324 mg via ORAL
  Filled 2022-01-09 (×6): qty 1

## 2022-01-09 MED ORDER — PROPRANOLOL HCL 20 MG PO TABS
20.0000 mg | ORAL_TABLET | Freq: Every day | ORAL | Status: DC | PRN
Start: 2022-01-09 — End: 2022-01-14

## 2022-01-09 MED ORDER — ALBUTEROL SULFATE (2.5 MG/3ML) 0.083% IN NEBU: 2.5000 mg | INHALATION_SOLUTION | Freq: Every day | RESPIRATORY_TRACT | Status: DC | PRN

## 2022-01-09 MED ORDER — SERTRALINE HCL 100 MG PO TABS
100.0000 mg | ORAL_TABLET | Freq: Every day | ORAL | Status: DC
  Administered 2022-01-09 – 2022-01-14 (×6): 100 mg via ORAL
  Filled 2022-01-09 (×7): qty 1

## 2022-01-09 MED ORDER — PROPRANOLOL HCL 20 MG PO TABS
20.0000 mg | ORAL_TABLET | Freq: Every day | ORAL | Status: DC
  Administered 2022-01-09 – 2022-01-13 (×5): 20 mg via ORAL
  Filled 2022-01-09 (×6): qty 1

## 2022-01-09 MED ORDER — HYDROCODONE-ACETAMINOPHEN 5-325 MG PO TABS
1.0000 | ORAL_TABLET | Freq: Four times a day (QID) | ORAL | Status: DC | PRN
  Administered 2022-01-09 – 2022-01-10 (×2): 1 via ORAL
  Filled 2022-01-09 (×4): qty 1

## 2022-01-09 MED ORDER — SIMVASTATIN 20 MG PO TABS
20.0000 mg | ORAL_TABLET | Freq: Every evening | ORAL | Status: DC
  Administered 2022-01-09 – 2022-01-13 (×5): 20 mg via ORAL
  Filled 2022-01-09 (×5): qty 1

## 2022-01-09 MED ORDER — NETARSUDIL DIMESYLATE 0.02 % OP SOLN
1.0000 [drp] | Freq: Every day | OPHTHALMIC | Status: DC
Start: 2022-01-09 — End: 2022-01-14
  Administered 2022-01-09 – 2022-01-13 (×5): 1 [drp] via OPHTHALMIC

## 2022-01-09 NOTE — Plan of Care (Signed)
  Problem: Clinical Measurements: Goal: Ability to maintain clinical measurements within normal limits will improve Outcome: Progressing Goal: Will remain free from infection Outcome: Progressing Goal: Diagnostic test results will improve Outcome: Progressing   Problem: Clinical Measurements: Goal: Will remain free from infection Outcome: Progressing   Problem: Clinical Measurements: Goal: Diagnostic test results will improve Outcome: Progressing   Problem: Activity: Goal: Risk for activity intolerance will decrease Outcome: Progressing   Problem: Elimination: Goal: Will not experience complications related to urinary retention Outcome: Progressing

## 2022-01-09 NOTE — Progress Notes (Signed)
PROGRESS NOTE    [name]  TDV:[id number] DOB: 16-Oct-1940 DOA: 01/07/2022 PCP: [name], MD   Brief Narrative:  HPI: [name] is a 81 y.o. 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 Patient returns to ED with close to 1 week of dark stools and intermittent brbpr. Patient notes no associated n/v/or abdominal pain or cramping. She states however. over the last two days she has noted increase fatigue and generalized weakness. She became more concerned today when episodes of bleeding became more frequent. Due to progression of symptoms patient presented to ED. Patient denies any sob/ fever/chills/ n/v/abdominal pain but does note abdominal fullness/bloating.  She denies presyncope.   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  Patient complains of melena but cannot confirm bright red blood per rectum, FOBT positive.  Hemoglobin slightly dropped from what it was before but has remained stable compared to yesterday, no indication of transfusion.  She has been seen by GI, they want to continue conservative management and no plans for EGD.  She is tolerating clear liquid diet.  Diet advancement per GI.  Continue Protonix.   COPD   -on nocturnal O2 -no acute exacerbation  -We will resume home inhalers.   Pulmonary nodules, -no active issues    HTN  -soft  -will continue to hold anti HTN medications currently     Depression /anxiety  -Resume home medications.  DVT prophylaxis: SCDs Start: 01/07/22 2332   Code Status: DNR  Family Communication:  None present at bedside.  Plan of care discussed with patient in length and he/she verbalized understanding and agreed with it.  Status is: Inpatient Remains inpatient appropriate because: Needs to advance the diet.  PT assessment.   Estimated body mass index is 30.73 kg/m as calculated from the following:   Height as of this encounter: '5\' 4"'$  (1.626 m).   Weight as of this encounter: 81.2 kg.  Pressure Injury 12/20/20 Elbow Anterior;Right (Active)  12/20/20 1300  Location: Elbow  Location Orientation: Anterior;Right  Staging:   Wound Description (Comments):   Present on Admission:    Nutritional Assessment: Body mass index is 30.73 kg/m.Marland Kitchen Seen by dietician.  I agree with the assessment and plan as outlined below: Nutrition Status:        . Skin Assessment: I have examined the patient's skin and I agree with the wound assessment as performed by the wound care RN as outlined below: Pressure Injury 12/20/20 Elbow Anterior;Right (Active)  12/20/20 1300  Location: Elbow  Location Orientation: Anterior;Right  Staging:   Wound Description (Comments):   Present on Admission:     Consultants:  GI  Procedures:  As above  Antimicrobials:  Anti-infectives (  From admission, onward)    None         Subjective:  Seen and examined.  She is happy that she has not had any further bleeding episodes or melena.  She has no complaints.  Objective: Vitals:   01/08/22 2330 01/09/22 0426 01/09/22 0809 01/09/22 1144  BP: (!) 106/59 (!) 122/56 (!) 110/54 (!) 108/55  Pulse: (!) 103 84 88 82  Resp: '17 19 20 19  '$ Temp: 97.8 F (36.6 C) 98.9 F (37.2 C)  98.3 F (36.8 C) 97.8 F (36.6 C)  TempSrc: Oral Axillary Oral Oral  SpO2: 94% 94% 94% 95%  Weight:      Height:        Intake/Output Summary (Last 24 hours) at 01/09/2022 1454 Last data filed at 01/09/2022 1448 Gross per 24 hour  Intake 780.95 ml  Output 1975 ml  Net -1194.05 ml    Filed Weights   01/08/22 0749  Weight: 81.2 kg    Examination:  General exam: Appears calm and comfortable  Respiratory system: Clear to auscultation. Respiratory effort normal. Cardiovascular system: S1 & S2 heard, RRR. No JVD, murmurs, rubs, gallops or clicks. No pedal edema. Gastrointestinal system: Abdomen is nondistended, soft and nontender. No organomegaly or masses felt. Normal bowel sounds heard. Central nervous system: Alert and oriented. No focal neurological deficits. Extremities: Symmetric 5 x 5 power. Skin: No rashes, lesions or ulcers.  Psychiatry: Judgement and insight appear normal. Mood & affect appropriate.    Data Reviewed: I have personally reviewed following labs and imaging studies  CBC: Recent Labs  Lab 01/07/22 1950 01/07/22 2334 01/08/22 0500 01/09/22 0211  WBC 8.4  --  6.4 7.9  NEUTROABS 6.1  --   --  5.4  HGB 9.6* 8.7* 7.9* 8.2*  HCT 29.5*  --  24.2* 25.0*  MCV 98.0  --  97.2 98.0  PLT 156  --  127* 141*    Basic Metabolic Panel: Recent Labs  Lab 01/07/22 1950 01/08/22 0500 01/09/22 0211  NA 137 143 142  K 3.9 3.8 3.5  CL 104 104 111  CO2 '23 25 23  '$ GLUCOSE 159* 117* 111*  BUN '17 16 10  '$ CREATININE 0.59 0.63 0.47  CALCIUM 8.6* 8.4* 8.4*    GFR: Estimated Creatinine Clearance: 54.9 mL/min (by C-G formula based on SCr of 0.47 mg/dL). Liver Function Tests: Recent Labs  Lab 01/07/22 1950 01/08/22 0500  AST 19 16  ALT 14 13  ALKPHOS 48 36*  BILITOT 0.4 0.4  PROT 6.0* 5.0*  ALBUMIN 3.5 3.0*    No results for input(s): "LIPASE", "AMYLASE" in the last 168 hours. No results for input(s): "AMMONIA" in the last 168 hours. Coagulation  Profile: Recent Labs  Lab 01/07/22 1950 01/08/22 0500  INR 1.1 1.2    Cardiac Enzymes: No results for input(s): "CKTOTAL", "CKMB", "CKMBINDEX", "TROPONINI" in the last 168 hours. BNP (last 3 results) No results for input(s): "PROBNP" in the last 8760 hours. HbA1C: No results for input(s): "HGBA1C" in the last 72 hours. CBG: Recent Labs  Lab 01/08/22 0743 01/09/22 0802  GLUCAP 113* 121*    Lipid Profile: No results for input(s): "CHOL", "HDL", "LDLCALC", "TRIG", "CHOLHDL", "LDLDIRECT" in the last 72 hours. Thyroid Function Tests: No results for input(s): "TSH", "T4TOTAL", "FREET4", "T3FREE", "THYROIDAB" in the last 72 hours. Anemia Panel: No results for input(s): "VITAMINB12", "FOLATE", "FERRITIN", "TIBC", "IRON", "RETICCTPCT" in the last 72 hours. Sepsis Labs: Recent Labs  Lab 01/07/22 2016  LATICACIDVEN 1.2  No results found for this or any previous visit (from the past 240 hour(s)).   Radiology Studies: No results found.  Scheduled Meds:  dorzolamide-timolol  1 drop Both Eyes BID   ferrous gluconate  324 mg Oral q morning   latanoprost  1 drop Both Eyes QHS   Netarsudil Dimesylate  1 drop Both Eyes QHS   pantoprazole (PROTONIX) IV  40 mg Intravenous Q12H   propranolol  20 mg Oral QHS   sertraline  100 mg Oral Daily   simvastatin  20 mg Oral QPM   umeclidinium-vilanterol  1 puff Inhalation Daily   Continuous Infusions:     LOS: 2 days   Darliss Cheney, MD [facility]  01/09/2022, 2:54 PM   *Please note that this is a verbal dictation therefore any spelling or grammatical errors are due to the "[place] [employer]" system interpretation.  Please page via [place] and do not message via secure chat for urgent patient care matters. Secure chat can be used for non urgent patient care matters.  How to contact the [facility] Attending or Consulting provider [place] or covering provider during after hours [place], for this patient?  Check the care team in [facility]  and look for a) attending/consulting TR[facility] provider listed and b) the [facility] team listed. Page or secure chat [phone]. Log into [email or web address] and use [place]'s universal password to access. If you do not have the password, please contact the hospital operator. Locate the [facility] provider you are looking for under [facility] and page to a number that you can be directly reached. If you still have difficulty reaching the provider, please page the [facility] (Director on Call) for the Hospitalists listed on amion for assistance.

## 2022-01-09 NOTE — Progress Notes (Signed)
[facility] Gastroenterology Progress Note  [name] JSEGBT 81 y.o. February 22, 1941  CC: Melena   Subjective: Patient states she has had no further bowel movements.  Denies nausea/vomiting.  Tolerating clear liquids without difficulty and would like to increase diet if possible.  Reports trouble eating due to difficulty with vision.  ROS : Review of Systems  Constitutional:  Negative for chills, fever and weight loss.  Gastrointestinal:  Negative for abdominal pain, blood in stool, constipation, diarrhea, heartburn, melena, nausea and vomiting.      Objective: Vital signs in last 24 hours: Vitals:   01/09/22 0426 01/09/22 0809  BP: (!) 122/56 (!) 110/54  Pulse: 84 88  Resp: 19 20  Temp: 98.9 F (37.2 C) 98.3 F (36.8 C)  SpO2: 94% 94%    Physical Exam:  General:  Alert, cooperative, no distress, appears stated age  Head:  Normocephalic, without obvious abnormality, atraumatic  Eyes:  Anicteric sclera, EOM's intact  Lungs:   Clear to auscultation bilaterally, respirations unlabored  Heart:  Regular rate and rhythm, S1, S2 normal  Abdomen:   Soft, non-tender, bowel sounds active all four quadrants,  no masses,     Lab Results: Recent Labs    01/08/22 0500 01/09/22 0211  NA 143 142  K 3.8 3.5  CL 104 111  CO2 25 23  GLUCOSE 117* 111*  BUN 16 10  CREATININE 0.63 0.47  CALCIUM 8.4* 8.4*   Recent Labs    01/07/22 1950 01/08/22 0500  AST 19 16  ALT 14 13  ALKPHOS 48 36*  BILITOT 0.4 0.4  PROT 6.0* 5.0*  ALBUMIN 3.5 3.0*   Recent Labs    01/07/22 1950 01/07/22 2334 01/08/22 0500 01/09/22 0211  WBC 8.4  --  6.4 7.9  NEUTROABS 6.1  --   --  5.4  HGB 9.6*   < > 7.9* 8.2*  HCT 29.5*  --  24.2* 25.0*  MCV 98.0  --  97.2 98.0  PLT 156  --  127* 141*   < > = values in this interval not displayed.   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   Diastolic heart failure -Echo 2022 shows ejection fraction 60 to 65%   Plan: No further episodes of bleeding.  Hemoglobin slightly improved.  Continued normal renal function. Continue Protonix 40 Mg twice daily Continue supportive care Continue daily CBC and transfuse as needed to maintain above 7 [facility] will follow  Garnette Scheuermann PA-C 01/09/2022, 10:18 AM  Contact #  [phone]

## 2022-01-10 DIAGNOSIS — K922 Gastrointestinal hemorrhage, unspecified: Secondary | ICD-10-CM | POA: Diagnosis not present

## 2022-01-10 LAB — CBC WITH DIFFERENTIAL/PLATELET
Abs Immature Granulocytes: 0.02 10*3/uL (ref 0.00–0.07)
Basophils Absolute: 0 10*3/uL (ref 0.0–0.1)
Basophils Relative: 0 %
Eosinophils Absolute: 0.2 10*3/uL (ref 0.0–0.5)
Eosinophils Relative: 2 %
HCT: 24.2 % — ABNORMAL LOW (ref 36.0–46.0)
Hemoglobin: 7.7 g/dL — ABNORMAL LOW (ref 12.0–15.0)
Immature Granulocytes: 0 %
Lymphocytes Relative: 28 %
Lymphs Abs: 2.6 10*3/uL (ref 0.7–4.0)
MCH: 31.4 pg (ref 26.0–34.0)
MCHC: 31.8 g/dL (ref 30.0–36.0)
MCV: 98.8 fL (ref 80.0–100.0)
Monocytes Absolute: 1.1 10*3/uL — ABNORMAL HIGH (ref 0.1–1.0)
Monocytes Relative: 12 %
Neutro Abs: 5.3 10*3/uL (ref 1.7–7.7)
Neutrophils Relative %: 58 %
Platelets: 169 10*3/uL (ref 150–400)
RBC: 2.45 MIL/uL — ABNORMAL LOW (ref 3.87–5.11)
RDW: 15 % (ref 11.5–15.5)
WBC: 9.3 10*3/uL (ref 4.0–10.5)
nRBC: 0.3 % — ABNORMAL HIGH (ref 0.0–0.2)

## 2022-01-10 LAB — GLUCOSE, CAPILLARY: Glucose-Capillary: 137 mg/dL — ABNORMAL HIGH (ref 70–99)

## 2022-01-10 LAB — CBC
HCT: 23.3 % — ABNORMAL LOW (ref 36.0–46.0)
Hemoglobin: 7.8 g/dL — ABNORMAL LOW (ref 12.0–15.0)
MCH: 32.2 pg (ref 26.0–34.0)
MCHC: 33.5 g/dL (ref 30.0–36.0)
MCV: 96.3 fL (ref 80.0–100.0)
Platelets: 135 10*3/uL — ABNORMAL LOW (ref 150–400)
RBC: 2.42 MIL/uL — ABNORMAL LOW (ref 3.87–5.11)
RDW: 14.8 % (ref 11.5–15.5)
WBC: 8.7 10*3/uL (ref 4.0–10.5)
nRBC: 0.2 % (ref 0.0–0.2)

## 2022-01-10 LAB — LACTIC ACID, PLASMA
Lactic Acid, Venous: 1.2 mmol/L (ref 0.5–1.9)
Lactic Acid, Venous: 2.5 mmol/L (ref 0.5–1.9)

## 2022-01-10 MED ORDER — SODIUM CHLORIDE 0.9 % IV SOLN: INTRAVENOUS | Status: DC

## 2022-01-10 MED ORDER — LIP MEDEX EX OINT
TOPICAL_OINTMENT | CUTANEOUS | Status: DC | PRN
  Administered 2022-01-10: 75 via TOPICAL
  Filled 2022-01-10: qty 7

## 2022-01-10 NOTE — Progress Notes (Signed)
Pt BP 88/40 - pt alert and oriented - sitting in chair.  MD notified.  New orders received.  Will continue to monitor.

## 2022-01-10 NOTE — Progress Notes (Signed)
[facility] Gastroenterology Progress Note  [name] EVOJJK 81 y.o. [birth year]  CC:  Melena   Subjective: Seen by Dr. Watt Climes this morning. Came back to check update on patient for afternoon. Patient reports 3 black stools today. Confirmed with RN and CNA that stools were black. Denies further complaints. Tolerating clears well.  ROS : Review of Systems  Constitutional:  Negative for chills and fever.  Gastrointestinal:  Positive for melena. Negative for abdominal pain, blood in stool, constipation, diarrhea, heartburn, nausea and vomiting.      Objective: Vital signs in last 24 hours: Vitals:   01/10/22 1212 01/10/22 1338  BP: (!) 88/40 (!) 105/48  Pulse: 72 92  Resp: 17 20  Temp: (!) 97.5 F (36.4 C)   SpO2: 96%     Physical Exam:  General:  Alert, cooperative, no distress, appears stated age  Head:  Normocephalic, without obvious abnormality, atraumatic  Eyes:  Anicteric sclera, EOM's intact, conjunctival pallor  Lungs:   Clear to auscultation bilaterally, respirations unlabored  Heart:  Regular rate and rhythm, S1, S2 normal  Abdomen:   Soft, non-tender, bowel sounds active all four quadrants,  no masses,     Lab Results: Recent Labs    01/08/22 0500 01/09/22 0211  NA 143 142  K 3.8 3.5  CL 104 111  CO2 25 23  GLUCOSE 117* 111*  BUN 16 10  CREATININE 0.63 0.47  CALCIUM 8.4* 8.4*   Recent Labs    01/07/22 1950 01/08/22 0500  AST 19 16  ALT 14 13  ALKPHOS 48 36*  BILITOT 0.4 0.4  PROT 6.0* 5.0*  ALBUMIN 3.5 3.0*   Recent Labs    01/07/22 1950 01/07/22 2334 01/09/22 0211 01/10/22 0047  WBC 8.4   < > 7.9 8.7  NEUTROABS 6.1  --  5.4  --   HGB 9.6*   < > 8.2* 7.8*  HCT 29.5*   < > 25.0* 23.3*  MCV 98.0   < > 98.0 96.3  PLT 156   < > 141* 135*   < > = values in this interval not displayed.   Recent Labs    01/07/22 1950 01/08/22 0500  LABPROT 14.5 15.3*  INR 1.1 1.2      Assessment Melena - hgb 7.8 (8.2)   Plan: Patient continuing to have  melena, though hgb is stable Will recheck stat CBC to evaluate hgb since she has had multiple black stools since last value was checked Pending hgb will decide plan on whether to proceed with EGD or continue conservative management [facility] will follow.  Jaeveon Ashland Radford Pax PA-C 01/10/2022, 3:18 PM  Contact #  [phone]

## 2022-01-10 NOTE — Progress Notes (Signed)
Date and time results received: 01/10/22 4:11 PM  Test: Lactic Acid Critical Value: 2.5  Name of Provider Notified: Darliss Cheney

## 2022-01-10 NOTE — Progress Notes (Signed)
Kristine Royal Vieyra 8:58 AM  Subjective: Patient seen and examined she did have a small black bowel movement this morning and patient case was discussed with the hospital team as well as the nurse and she is not having any pain and wants to eat and has no new complaints  Objective: Vital signs stable afebrile no acute distress abdomen is soft nontender BUN decreased slightly hemoglobin 7.8 today 8.2 yesterday 7.9 the day before  Assessment: Presumed diverticular bleeding currently stable  Plan: I think a trial of soft solids is okay and if signs of bleeding consider nuclear bleeding scan or CTA and will check on tomorrow call sooner if question or problem  [facility] E  office [phone] After 5PM or if no answer call [phone]

## 2022-01-10 NOTE — TOC Initial Note (Signed)
Transition of Care [facility]) - Initial/Assessment Note    Patient Details  Name: [name] MRN: [id number] Date of Birth: [birth year]  Transition of Care [facility]) CM/SW Contact:    Ninfa Meeker, RN Phone Number: 01/10/2022, 9:34 AM  Clinical Narrative:                 Transition of Care Screening Note:  Transition of Care Department [facility]) has reviewed patient and no TOC needs have been identified at this time. We will continue to monitor patient advancement through Interdisciplinary progressions. If new patient transition needs arise, please place a consult.          Patient Goals and CMS Choice        Expected Discharge Plan and Services                                                Prior Living Arrangements/Services                       Activities of Daily Living      Permission Sought/Granted                  Emotional Assessment              Admission diagnosis:  GI bleed [K92.2] Patient Active Problem List   Diagnosis Date Noted   GI bleed 01/07/2022   Acute blood loss anemia 05/02/2021   Symptomatic anemia    DOE (dyspnea on exertion) 11/18/2017   COPD GOLD II  11/18/2017   Diverticulosis of intestine with bleeding 09/05/2017   Influenza A 08/08/2017   Acute respiratory failure with hypoxia (HCC) 08/07/2017   Depression 08/07/2017   Hyperlipemia 08/07/2017   Macular degeneration 08/07/2017   Glaucoma 08/07/2017   Weakness 08/07/2017   Dehydration 08/07/2017   Lower GI bleeding 10/14/2016   Diverticulosis of colon 10/14/2016   Anxiety 10/14/2016   Lower GI bleed 10/14/2016   Acute GI bleeding    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   Dermatochalasis of both upper eyelids 08/17/2014   Osteoarthrosis, unspecified whether generalized or localized, lower leg 12/20/2011   PCP:  [name], MD Pharmacy:  No Pharmacies Listed    Social Determinants of Health (SDOH) Interventions    Readmission  Risk Interventions    05/10/2021   11:32 AM  Readmission Risk Prevention Plan  Transportation Screening Complete  PCP or Specialist Appt within 3-5 Days Complete  HRI or Home Care Consult Complete  Social Work Consult for [place] Planning/Counseling Patient refused  Palliative Care Screening Not Applicable  Medication Review [employer]) Complete

## 2022-01-10 NOTE — Progress Notes (Signed)
PROGRESS NOTE    [name]  AYT:[id number] DOB: 1941/04/17 DOA: 01/07/2022 PCP: [name], MD   Brief Narrative:  HPI: [name] is a 81 y.o. 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 Patient returns to ED with close to 1 week of dark stools and intermittent brbpr. Patient notes no associated n/v/or abdominal pain or cramping. She states however. over the last two days she has noted increase fatigue and generalized weakness. She became more concerned today when episodes of bleeding became more frequent. Due to progression of symptoms patient presented to ED. Patient denies any sob/ fever/chills/ n/v/abdominal pain but does note abdominal fullness/bloating.  She denies presyncope.   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  Patient complains of melena but cannot confirm bright red blood per rectum, FOBT positive.  Hemoglobin slightly dropped from what it was before but has remained stable compared to yesterday, no indication of transfusion.  She has been seen by GI, they want to continue conservative management and no plans for EGD.  She is tolerating clear liquid diet.  However she tells me that she had 3 loose bowel movements  last night and 2 this morning and all of them were black and her hemoglobin is slightly trending down as well.  I have had extensive discussion with Dr. Driscilla Grammes of GI, I have fully informed him about those episodes, I have also requested him to manage patient's diet orders as he feels appropriate.   COPD  -on nocturnal O2 -no acute exacerbation  -We will resume home inhalers.   Pulmonary nodules, -no active issues    HTN  -soft  -will continue to hold anti HTN medications currently     Depression /anxiety  -Resume home medications.  DVT prophylaxis: SCDs Start: 01/07/22 2332   Code Status: DNR  Family Communication:  None present at bedside.  Plan of care discussed with patient in length and he/she verbalized understanding and agreed with it.  Status is: Inpatient Remains inpatient appropriate because: Needs to advance the diet.  PT assessment.   Estimated body mass index is 29.1 kg/m as calculated from the following:   Height as of this encounter: '5\' 4"'$  (1.626 m).   Weight as of this encounter: 76.9 kg.  Pressure Injury 12/20/20 Elbow Anterior;Right (Active)  12/20/20 1300  Location: Elbow  Location Orientation: Anterior;Right  Staging:   Wound Description (Comments):   Present on Admission:    Nutritional Assessment: Body mass index is 29.1 kg/m.Marland Kitchen Seen by dietician.  I agree with the assessment and plan as outlined below: Nutrition Status:        . Skin Assessment: I have examined the patient's skin and I agree  with the wound assessment as performed by the wound care RN as outlined below: Pressure Injury 12/20/20 Elbow Anterior;Right (Active)  12/20/20 1300  Location: Elbow  Location Orientation: Anterior;Right  Staging:   Wound Description (Comments):   Present on Admission:     Consultants:  GI  Procedures:  As above  Antimicrobials:  Anti-infectives (From admission, onward)    None         Subjective:  Patient seen and examined.  She is  frustrated and tired because she had black stools x5 since last night.  No other complaint.  No abdominal pain.  No nausea.  Objective: Vitals:   01/10/22 0314 01/10/22 0727 01/10/22 0756 01/10/22 1212  BP: 91/70  (!) 110/46 (!) 88/40  Pulse: 74  79 72  Resp: '20  19 17  '$ Temp: 97.8 F (36.6 C)  98 F (36.7 C) (!) 97.5 F (36.4 C)  TempSrc: Oral  Oral Oral  SpO2: 95% 96% 96% 96%  Weight:      Height:        Intake/Output Summary (Last 24 hours) at 01/10/2022 1217 Last data filed at 01/10/2022 0316 Gross per 24 hour  Intake --  Output 2150 ml  Net -2150 ml    Filed Weights   01/08/22 0749 01/10/22 0117  Weight: 81.2 kg 76.9 kg    Examination:  General exam: Appears slightly tired and pale. Respiratory system: Clear to auscultation. Respiratory effort normal. Cardiovascular system: S1 & S2 heard, RRR. No JVD, murmurs, rubs, gallops or clicks. No pedal edema. Gastrointestinal system: Abdomen is nondistended, soft and nontender. No organomegaly or masses felt. Normal bowel sounds heard. Central nervous system: Alert and oriented. No focal neurological deficits. Extremities: Symmetric 5 x 5 power. Skin: No rashes, lesions or ulcers.  Psychiatry: Judgement and insight appear normal. Mood & affect appropriate.    Data Reviewed: I have personally reviewed following labs and imaging studies  CBC: Recent Labs  Lab 01/07/22 1950 01/07/22 2334 01/08/22 0500 01/09/22 0211 01/10/22 0047  WBC 8.4  --  6.4 7.9 8.7  NEUTROABS 6.1  --   --  5.4  --   HGB 9.6* 8.7* 7.9* 8.2* 7.8*  HCT 29.5*  --  24.2* 25.0* 23.3*  MCV 98.0  --  97.2 98.0 96.3  PLT 156  --  127* 141* 135*    Basic Metabolic Panel: Recent Labs  Lab 01/07/22 1950 01/08/22 0500 01/09/22 0211  NA 137 143 142  K 3.9 3.8 3.5  CL 104 104 111  CO2 '23 25 23  '$ GLUCOSE 159* 117* 111*  BUN '17 16 10  '$ CREATININE 0.59 0.63 0.47  CALCIUM 8.6* 8.4* 8.4*    GFR: Estimated Creatinine Clearance: 53.5 mL/min (by C-G  formula based on SCr of 0.47 mg/dL). Liver Function Tests: Recent Labs  Lab 01/07/22 1950 01/08/22 0500  AST 19 16  ALT 14 13  ALKPHOS 48 36*  BILITOT 0.4 0.4  PROT 6.0* 5.0*  ALBUMIN 3.5 3.0*    No results for input(s): "LIPASE", "AMYLASE" in the last 168 hours. No results for input(s): "AMMONIA" in the last 168 hours. Coagulation Profile: Recent Labs  Lab 01/07/22 1950 01/08/22 0500  INR 1.1 1.2    Cardiac Enzymes: No results for input(s): "CKTOTAL", "CKMB", "CKMBINDEX", "TROPONINI" in the last 168 hours. BNP (last 3 results) No results for input(s): "PROBNP" in the last 8760 hours. HbA1C: No results for input(s): "HGBA1C" in the last 72 hours. CBG: Recent Labs  Lab 01/08/22 0743 01/09/22  0802 01/10/22 0604  GLUCAP 113* 121* 137*    Lipid Profile: No results for input(s): "CHOL", "HDL", "LDLCALC", "TRIG", "CHOLHDL", "LDLDIRECT" in the last 72 hours. Thyroid Function Tests: No results for input(s): "TSH", "T4TOTAL", "FREET4", "T3FREE", "THYROIDAB" in the last 72 hours. Anemia Panel: No results for input(s): "VITAMINB12", "FOLATE", "FERRITIN", "TIBC", "IRON", "RETICCTPCT" in the last 72 hours. Sepsis Labs: Recent Labs  Lab 01/07/22 2016  LATICACIDVEN 1.2     No results found for this or any previous visit (from the past 240 hour(s)).   Radiology Studies: No results found.  Scheduled Meds:  dorzolamide-timolol  1 drop Both Eyes BID   ferrous gluconate  324 mg Oral q morning   latanoprost  1 drop Both Eyes QHS   Netarsudil Dimesylate  1 drop Both Eyes QHS   pantoprazole (PROTONIX) IV  40 mg Intravenous Q12H   propranolol  20 mg Oral QHS   sertraline  100 mg Oral Daily   simvastatin  20 mg Oral QPM   umeclidinium-vilanterol  1 puff Inhalation Daily   Continuous Infusions:     LOS: 3 days   Darliss Cheney, MD [facility]  01/10/2022, 12:17 PM   *Please note that this is a verbal dictation therefore any spelling or grammatical errors are  due to the "[place]" system interpretation.  Please page via [place] and do not message via secure chat for urgent patient care matters. Secure chat can be used for non urgent patient care matters.  How to contact the [facility] Attending or Consulting provider [employer] or covering provider during after hours [place], for this patient?  Check the care team in [facility] and look for a) attending/consulting [facility] provider listed and b) the [facility] team listed. Page or secure chat [phone]. Log into [email or web address] and use [place] universal password to access. If you do not have the password, please contact the hospital operator. Locate the [facility] provider you are looking for under [facility] and page to a number that you can be directly reached. If you still have difficulty reaching the provider, please page the [facility] (Director on Call) for the Hospitalists listed on amion for assistance.

## 2022-01-11 ENCOUNTER — Inpatient Hospital Stay (HOSPITAL_COMMUNITY): Payer: Medicare HMO | Admitting: Anesthesiology

## 2022-01-11 ENCOUNTER — Encounter (HOSPITAL_COMMUNITY)
Admission: EM | Disposition: A | Discharge status: Home Health | Payer: Self-pay | Source: Home / Self Care | Attending: Family Medicine

## 2022-01-11 ENCOUNTER — Encounter (HOSPITAL_COMMUNITY): Payer: Self-pay | Admitting: Internal Medicine

## 2022-01-11 DIAGNOSIS — K922 Gastrointestinal hemorrhage, unspecified: Secondary | ICD-10-CM | POA: Diagnosis not present

## 2022-01-11 DIAGNOSIS — K317 Polyp of stomach and duodenum: Secondary | ICD-10-CM

## 2022-01-11 DIAGNOSIS — Z87891 Personal history of nicotine dependence: Secondary | ICD-10-CM

## 2022-01-11 DIAGNOSIS — D62 Acute posthemorrhagic anemia: Secondary | ICD-10-CM

## 2022-01-11 DIAGNOSIS — J449 Chronic obstructive pulmonary disease, unspecified: Secondary | ICD-10-CM | POA: Diagnosis not present

## 2022-01-11 DIAGNOSIS — K449 Diaphragmatic hernia without obstruction or gangrene: Secondary | ICD-10-CM | POA: Diagnosis not present

## 2022-01-11 HISTORY — PX: ESOPHAGOGASTRODUODENOSCOPY (EGD) WITH PROPOFOL: SHX5813

## 2022-01-11 LAB — BPAM RBC
Blood Product Expiration Date: 202308122359
Blood Product Expiration Date: 202308122359
Unit Type and Rh: 5100
Unit Type and Rh: 5100

## 2022-01-11 LAB — CBC
HCT: 21 % — ABNORMAL LOW (ref 36.0–46.0)
HCT: 21.7 % — ABNORMAL LOW (ref 36.0–46.0)
Hemoglobin: 6.9 g/dL — CL (ref 12.0–15.0)
Hemoglobin: 7.3 g/dL — ABNORMAL LOW (ref 12.0–15.0)
MCH: 32.1 pg (ref 26.0–34.0)
MCH: 31.7 pg (ref 26.0–34.0)
MCHC: 32.9 g/dL (ref 30.0–36.0)
MCHC: 33.6 g/dL (ref 30.0–36.0)
MCV: 97.7 fL (ref 80.0–100.0)
MCV: 94.3 fL (ref 80.0–100.0)
Platelets: 145 10*3/uL — ABNORMAL LOW (ref 150–400)
Platelets: 138 10*3/uL — ABNORMAL LOW (ref 150–400)
RBC: 2.15 MIL/uL — ABNORMAL LOW (ref 3.87–5.11)
RBC: 2.3 MIL/uL — ABNORMAL LOW (ref 3.87–5.11)
RDW: 15.1 % (ref 11.5–15.5)
RDW: 16.5 % — ABNORMAL HIGH (ref 11.5–15.5)
WBC: 5.8 10*3/uL (ref 4.0–10.5)
WBC: 7.5 10*3/uL (ref 4.0–10.5)
nRBC: 0.3 % — ABNORMAL HIGH (ref 0.0–0.2)
nRBC: 0.3 % — ABNORMAL HIGH (ref 0.0–0.2)

## 2022-01-11 LAB — TYPE AND SCREEN
ABO/RH(D): O POS
Antibody Screen: NEGATIVE
Unit division: 0
Unit division: 0

## 2022-01-11 LAB — COMPREHENSIVE METABOLIC PANEL
ALT: 15 U/L (ref 0–44)
AST: 23 U/L (ref 15–41)
Albumin: 2.6 g/dL — ABNORMAL LOW (ref 3.5–5.0)
Alkaline Phosphatase: 32 U/L — ABNORMAL LOW (ref 38–126)
Anion gap: 7 (ref 5–15)
BUN: 17 mg/dL (ref 8–23)
CO2: 20 mmol/L — ABNORMAL LOW (ref 22–32)
Calcium: 7.8 mg/dL — ABNORMAL LOW (ref 8.9–10.3)
Chloride: 113 mmol/L — ABNORMAL HIGH (ref 98–111)
Creatinine, Ser: 0.58 mg/dL (ref 0.44–1.00)
GFR, Estimated: >60 mL/min (ref >60)
Glucose, Bld: 149 mg/dL — ABNORMAL HIGH (ref 70–99)
Potassium: 3.6 mmol/L (ref 3.5–5.1)
Sodium: 140 mmol/L (ref 135–145)
Total Bilirubin: 0.4 mg/dL (ref 0.3–1.2)
Total Protein: 4.5 g/dL — ABNORMAL LOW (ref 6.5–8.1)

## 2022-01-11 LAB — HEMOGLOBIN AND HEMATOCRIT, BLOOD
HCT: 24.1 % — ABNORMAL LOW (ref 36.0–46.0)
Hemoglobin: 7.8 g/dL — ABNORMAL LOW (ref 12.0–15.0)

## 2022-01-11 LAB — MAGNESIUM: Magnesium: 2 mg/dL (ref 1.7–2.4)

## 2022-01-11 LAB — BASIC METABOLIC PANEL
Anion gap: 6 (ref 5–15)
BUN: 16 mg/dL (ref 8–23)
CO2: 22 mmol/L (ref 22–32)
Calcium: 7.9 mg/dL — ABNORMAL LOW (ref 8.9–10.3)
Chloride: 114 mmol/L — ABNORMAL HIGH (ref 98–111)
Creatinine, Ser: 0.45 mg/dL (ref 0.44–1.00)
GFR, Estimated: >60 mL/min (ref >60)
Glucose, Bld: 117 mg/dL — ABNORMAL HIGH (ref 70–99)
Potassium: 3.3 mmol/L — ABNORMAL LOW (ref 3.5–5.1)
Sodium: 142 mmol/L (ref 135–145)

## 2022-01-11 LAB — LACTIC ACID, PLASMA: Lactic Acid, Venous: 0.9 mmol/L (ref 0.5–1.9)

## 2022-01-11 LAB — GLUCOSE, CAPILLARY: Glucose-Capillary: 115 mg/dL — ABNORMAL HIGH (ref 70–99)

## 2022-01-11 LAB — PREPARE RBC (CROSSMATCH)

## 2022-01-11 SURGERY — ESOPHAGOGASTRODUODENOSCOPY (EGD) WITH PROPOFOL
Anesthesia: Monitor Anesthesia Care

## 2022-01-11 MED ORDER — PROPOFOL 10 MG/ML IV BOLUS
INTRAVENOUS | Status: DC | PRN
  Administered 2022-01-11: 10 mg via INTRAVENOUS

## 2022-01-11 MED ORDER — PHENYLEPHRINE 80 MCG/ML (10ML) SYRINGE FOR IV PUSH (FOR BLOOD PRESSURE SUPPORT)
PREFILLED_SYRINGE | INTRAVENOUS | Status: DC | PRN
  Administered 2022-01-11: 160 ug via INTRAVENOUS

## 2022-01-11 MED ORDER — PROPOFOL 500 MG/50ML IV EMUL
INTRAVENOUS | Status: DC | PRN
  Administered 2022-01-11: 100 ug/kg/min via INTRAVENOUS

## 2022-01-11 MED ORDER — PANTOPRAZOLE SODIUM 40 MG PO TBEC
40.0000 mg | DELAYED_RELEASE_TABLET | Freq: Every day | ORAL | Status: DC
  Administered 2022-01-11 – 2022-01-14 (×4): 40 mg via ORAL
  Filled 2022-01-11 (×4): qty 1

## 2022-01-11 MED ORDER — POTASSIUM CHLORIDE CRYS ER 20 MEQ PO TBCR
40.0000 meq | EXTENDED_RELEASE_TABLET | Freq: Once | ORAL | Status: AC
  Administered 2022-01-11: 40 meq via ORAL
  Filled 2022-01-11: qty 2

## 2022-01-11 MED ORDER — SODIUM CHLORIDE 0.9 % IV SOLN: INTRAVENOUS | Status: DC

## 2022-01-11 MED ORDER — SODIUM CHLORIDE 0.9% IV SOLUTION
Freq: Once | INTRAVENOUS | Status: AC
Start: 2022-01-11 — End: 2022-01-11

## 2022-01-11 SURGICAL SUPPLY — 15 items

## 2022-01-11 NOTE — Progress Notes (Signed)
New orders in from Dr. Nevada Crane., see epic for details.

## 2022-01-11 NOTE — Anesthesia Procedure Notes (Signed)
Procedure Name: General with mask airway Date/Time: 01/11/2022 2:07 PM  Performed by: Erick Colace, CRNAPre-anesthesia Checklist: Patient identified, Emergency Drugs available, Suction available and Patient being monitored Patient Re-evaluated:Patient Re-evaluated prior to induction Oxygen Delivery Method: Nasal cannula Preoxygenation: Pre-oxygenation with 100% oxygen Induction Type: IV induction Airway Equipment and Method: Bite block Dental Injury: Teeth and Oropharynx as per pre-operative assessment

## 2022-01-11 NOTE — Anesthesia Preprocedure Evaluation (Addendum)
Anesthesia Evaluation  Patient identified by MRN, date of birth, ID band Patient awake    Reviewed: Allergy & Precautions, NPO status , Patient's Chart, lab work & pertinent test results, reviewed documented beta blocker date and time   Airway Mallampati: III  TM Distance: >3 FB Neck ROM: Full    Dental  (+) Dental Advisory Given, Partial Upper, Partial Lower   Pulmonary COPD,  oxygen dependent, former smoker,    Pulmonary exam normal breath sounds clear to auscultation       Cardiovascular negative cardio ROS Normal cardiovascular exam Rhythm:Regular Rate:Normal     Neuro/Psych  Headaches, PSYCHIATRIC DISORDERS Anxiety Depression    GI/Hepatic Neg liver ROS, GERD  Medicated,GI bleed    Endo/Other  negative endocrine ROS  Renal/GU negative Renal ROS     Musculoskeletal  (+) Arthritis ,   Abdominal   Peds  Hematology  (+) Blood dyscrasia (Thrombocytopenia), anemia ,   Anesthesia Other Findings Day of surgery medications reviewed with the patient.  Reproductive/Obstetrics                            Anesthesia Physical Anesthesia Plan  ASA: 3  Anesthesia Plan: MAC   Post-op Pain Management: Minimal or no pain anticipated   Induction: Intravenous  PONV Risk Score and Plan: 2 and TIVA and Treatment may vary due to age or medical condition  Airway Management Planned: Natural Airway and Nasal Cannula  Additional Equipment:   Intra-op Plan:   Post-operative Plan:   Informed Consent: I have reviewed the patients History and Physical, chart, labs and discussed the procedure including the risks, benefits and alternatives for the proposed anesthesia with the patient or authorized representative who has indicated his/her understanding and acceptance.   Patient has DNR.  Discussed DNR with patient and Suspend DNR.   Dental advisory given  Plan Discussed with: CRNA  Anesthesia Plan  Comments:        Anesthesia Quick Evaluation

## 2022-01-11 NOTE — Op Note (Signed)
[facility] Patient Name: [name] Procedure Date : 01/11/2022 MRN: [id number] Attending MD: Clarene Essex , MD Date of Birth: 1941/03/22 CSN: [id number] Age: 81 Admit Type: Inpatient Procedure:                Upper GI endoscopy Indications:              Acute post hemorrhagic anemia, Melena Providers:                Clarene Essex, MD, Grace Isaac, RN, Cletis Athens,                            Technician Referring MD:              Medicines:                Monitored Anesthesia Care Complications:            No immediate complications. Estimated Blood Loss:     Estimated blood loss: none. Procedure:                Pre-Anesthesia Assessment:                           - Prior to the procedure, a History and Physical                            was performed, and patient medications and                            allergies were reviewed. The patient's tolerance of                            previous anesthesia was also reviewed. The risks                            and benefits of the procedure and the sedation                            options and risks were discussed with the patient.                            All questions were answered, and informed consent                            was obtained. Prior Anticoagulants: The patient has                            taken no previous anticoagulant or antiplatelet                            agents. ASA Grade Assessment: III - A patient with                            severe systemic disease. After reviewing the risks  and benefits, the patient was deemed in                            satisfactory condition to undergo the procedure.                           After obtaining informed consent, the endoscope was                            passed under direct vision. Throughout the                            procedure, the patient's blood pressure, pulse, and                            oxygen saturations  were monitored continuously. The                            [id number]-[id number] ([id number]) Olympus endoscope was introduced                            through the mouth, and advanced to the third part                            of duodenum. The upper GI endoscopy was                            accomplished without difficulty. The patient                            tolerated the procedure well. Scope In: Scope Out: Findings:      The larynx was normal.      A Questionably tiny hiatal hernia was present.      Multiple small semi-sessile polyps with no stigmata of recent bleeding       were found in the gastric body.      A small amount of food (residue) was found in the gastric fundus.      The duodenal bulb, first portion of the duodenum, second portion of the       duodenum and third portion of the duodenum were normal.      The exam was otherwise without abnormality. Impression:               - Normal larynx.                           - Questionably tiny hiatal hernia.                           - Multiple gastric polyps.                           - A small amount of food (residue) in the stomach.                           - Normal duodenal bulb, first portion  of the                            duodenum, second portion of the duodenum and third                            portion of the duodenum.                           - The examination was otherwise normal.                           - No specimens collected. No signs of bleeding Recommendation:           - Soft diet today.                           - Continue present medications. Once a day p.o.                            pump inhibitor fine                           - Return to GI clinic PRN. Capsule versus                            colonoscopy next if she continues to bleed but                            hopefully all her black stools are just old blood                            and we will ask rounding team to check on tomorrow                            - Telephone GI clinic if symptomatic PRN. Procedure Code(s):        --- Professional ---                           [phone], Esophagogastroduodenoscopy, flexible,                            transoral; diagnostic, including collection of                            specimen(s) by brushing or washing, when performed                            (separate procedure) Diagnosis Code(s):        --- Professional ---                           K44.9, Diaphragmatic hernia without obstruction or                            gangrene  K31.7, Polyp of stomach and duodenum                           D62, Acute posthemorrhagic anemia                           K92.1, Melena (includes Hematochezia) CPT copyright 2019 [employer]. All rights reserved. The codes documented in this report are preliminary and upon coder review may  be revised to meet current compliance requirements. Clarene Essex, MD 01/11/2022 2:24:08 PM This report has been signed electronically. Number of Addenda: 0

## 2022-01-11 NOTE — Progress Notes (Signed)
PROGRESS NOTE    [name]  ACZ:[id number] DOB: 04-21-1941 DOA: 01/07/2022 PCP: [name], MD   Brief Narrative:  HPI: [name] is a 81 y.o. 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 Patient returns to ED with close to 1 week of dark stools and intermittent brbpr. Patient notes no associated n/v/or abdominal pain or cramping. She states however. over the last two days she has noted increase fatigue and generalized weakness. She became more concerned today when episodes of bleeding became more frequent. Due to progression of symptoms patient presented to ED. Patient denies any sob/ fever/chills/ n/v/abdominal pain but does note abdominal fullness/bloating.  She denies presyncope.   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  Patient complains of melena but cannot confirm bright red blood per rectum, FOBT positive.  Hemoglobin slightly dropped from what it was before and was stable until yesterday but dropped to 6.9 today.  GI is following her and they wanted to follow conservatively, it looks like they wanted to advance her diet to soft diet yesterday but order was not placed (I am glad about that) because now with drop of  hemoglobin, it is obvious that she is having GI bleeding and may benefit from intervention, for which I will defer to GI.  I will go ahead and transfuse her 1 unit PRBC.    COPD  -on nocturnal O2 -no acute exacerbation  -We will resume home inhalers.   Pulmonary nodules, -no active issues    HTN/lactic acidosis/hypotension -BP soft and sometimes lower. -will continue to hold anti HTN medications currently.  Lactic acid was elevated.  Continue IV fluids per repeating lactic acid today.  Hypokalemia: We will replace.,  Check magnesium.    Depression /anxiety  -Continue home medications.  DVT prophylaxis: SCDs Start: 01/07/22 2332   Code Status: DNR  Family Communication:  None present at bedside.  Plan of care discussed with patient in length and he/she verbalized understanding and agreed with it.  Status is: Inpatient Remains inpatient appropriate because: Actively GI bleeding   Estimated body mass index is 29.14 kg/m as calculated from the following:   Height as of this encounter: '5\' 4"'$  (1.626 m).   Weight as of this encounter: 77 kg.  Pressure Injury 12/20/20 Elbow Anterior;Right (Active)  12/20/20 1300  Location: Elbow  Location Orientation: Anterior;Right  Staging:   Wound Description (Comments):   Present on Admission:    Nutritional Assessment: Body mass index is 29.14 kg/m.Marland Kitchen Seen by dietician.  I agree with the assessment and plan as outlined below: Nutrition Status:        . Skin Assessment: I have examined the patient's skin and I agree  with the wound assessment as performed by the wound care RN as outlined below: Pressure Injury 12/20/20 Elbow Anterior;Right (Active)  12/20/20 1300  Location: Elbow  Location Orientation: Anterior;Right  Staging:   Wound Description (Comments):   Present on Admission:     Consultants:  GI  Procedures:  As above  Antimicrobials:  Anti-infectives (From admission, onward)    None          Subjective:  Patient seen and examined.  She appears to be lethargic.  Frustrated as well.  No specific complaint.  Objective: Vitals:   01/11/22 0300 01/11/22 0400 01/11/22 0500 01/11/22 0747  BP:  (!) 98/40  (!) 102/48  Pulse: 70 81 69 87  Resp: '19 14 18 19  '$ Temp:  97.9 F (36.6 C)  99.3 F (37.4 C)  TempSrc:  Oral  Oral  SpO2: 96% 95% 95% 94%  Weight:   77 kg   Height:        Intake/Output Summary (Last 24 hours) at 01/11/2022 1030 Last data filed at 01/11/2022 0700 Gross per 24 hour  Intake 1307.61 ml  Output 850 ml  Net 457.61 ml    Filed Weights   01/08/22 0749 01/10/22 0117 01/11/22 0500  Weight: 81.2 kg 76.9 kg 77 kg    Examination:  General exam: Appears calm and comfortable, appears pale. Respiratory system: Clear to auscultation. Respiratory effort normal. Cardiovascular system: S1 & S2 heard, RRR. No JVD, murmurs, rubs, gallops or clicks. No pedal edema. Gastrointestinal system: Abdomen is nondistended, soft and nontender. No organomegaly or masses felt. Normal bowel sounds heard. Central nervous system: Alert and oriented. No focal neurological deficits. Extremities: Symmetric 5 x 5 power. Skin: No rashes, lesions or ulcers.  Psychiatry: Judgement and insight appear normal. Mood & affect appropriate.    Data Reviewed: I have personally reviewed following labs and imaging studies  CBC: Recent Labs  Lab 01/07/22 1950 01/07/22 2334 01/08/22 0500 01/09/22 0211 01/10/22 0047 01/10/22 1544 01/11/22 0832  WBC 8.4  --  6.4 7.9 8.7 9.3 5.8  NEUTROABS 6.1  --   --  5.4  --  5.3  --   HGB 9.6*   < > 7.9* 8.2* 7.8* 7.7* 6.9*  HCT 29.5*  --  24.2* 25.0* 23.3* 24.2* 21.0*  MCV 98.0  --  97.2 98.0 96.3 98.8 97.7  PLT 156  --  127* 141* 135* 169 145*   < > = values in this interval not displayed.    Basic Metabolic Panel: Recent Labs  Lab 01/07/22 1950 01/08/22 0500 01/09/22 0211 01/11/22 0832  NA 137 143 142 142  K 3.9 3.8 3.5 3.3*  CL 104  104 111 114*  CO2 '23 25 23 22  '$ GLUCOSE 159* 117* 111* 117*  BUN '17 16 10 16  '$ CREATININE 0.59 0.63 0.47 0.45  CALCIUM 8.6* 8.4* 8.4* 7.9*    GFR: Estimated Creatinine Clearance: 53.5 mL/min (by C-G formula based on SCr of 0.45 mg/dL). Liver Function Tests: Recent Labs  Lab 01/07/22 1950 01/08/22 0500  AST 19 16  ALT 14 13  ALKPHOS 48 36*  BILITOT 0.4 0.4  PROT 6.0* 5.0*  ALBUMIN 3.5 3.0*    No results for input(s): "LIPASE", "AMYLASE" in the last 168 hours. No results for input(s): "AMMONIA" in the last 168 hours. Coagulation Profile: Recent Labs  Lab 01/07/22 1950 01/08/22 0500  INR 1.1 1.2    Cardiac Enzymes: No results for input(s): "CKTOTAL", "CKMB", "CKMBINDEX", "TROPONINI" in the last 168 hours.  BNP (last 3 results) No results for input(s): "PROBNP" in the last 8760 hours. HbA1C: No results for input(s): "HGBA1C" in the last 72 hours. CBG: Recent Labs  Lab 01/08/22 0743 01/09/22 0802 01/10/22 0604 01/11/22 0749  GLUCAP 113* 121* 137* 115*    Lipid Profile: No results for input(s): "CHOL", "HDL", "LDLCALC", "TRIG", "CHOLHDL", "LDLDIRECT" in the last 72 hours. Thyroid Function Tests: No results for input(s): "TSH", "T4TOTAL", "FREET4", "T3FREE", "THYROIDAB" in the last 72 hours. Anemia Panel: No results for input(s): "VITAMINB12", "FOLATE", "FERRITIN", "TIBC", "IRON", "RETICCTPCT" in the last 72 hours. Sepsis Labs: Recent Labs  Lab 01/07/22 2016 01/10/22 1258 01/10/22 1500 01/11/22 0832  LATICACIDVEN 1.2 1.2 2.5* 0.9     No results found for this or any previous visit (from the past 240 hour(s)).   Radiology Studies: No results found.  Scheduled Meds:  sodium chloride   Intravenous Once   dorzolamide-timolol  1 drop Both Eyes BID   ferrous gluconate  324 mg Oral q morning   latanoprost  1 drop Both Eyes QHS   Netarsudil Dimesylate  1 drop Both Eyes QHS   pantoprazole (PROTONIX) IV  40 mg Intravenous Q12H   potassium chloride  40 mEq  Oral Once   propranolol  20 mg Oral QHS   sertraline  100 mg Oral Daily   simvastatin  20 mg Oral QPM   umeclidinium-vilanterol  1 puff Inhalation Daily   Continuous Infusions:  sodium chloride 75 mL/hr at 01/11/22 0135      LOS: 4 days   Darliss Cheney, MD [facility]  01/11/2022, 10:30 AM   *Please note that this is a verbal dictation therefore any spelling or grammatical errors are due to the "[place] [employer]" system interpretation.  Please page via [place] and do not message via secure chat for urgent patient care matters. Secure chat can be used for non urgent patient care matters.  How to contact the [facility] Attending or Consulting provider [place] or covering provider during after hours [place], for this patient?  Check the care team in [facility] and look for a) attending/consulting [facility] provider listed and b) the [name] team listed. Page or secure chat [phone]. Log into [email or web address] and use [place]'s universal password to access. If you do not have the password, please contact the hospital operator. Locate the [facility] provider you are looking for under [facility] and page to a number that you can be directly reached. If you still have difficulty reaching the provider, please page the [facility] (Director on Call) for the Hospitalists listed on amion for assistance.

## 2022-01-11 NOTE — Progress Notes (Signed)
I just assisted the NT to clean massive Black tarry stool diarrhea from patient. She's asymptomatic. Notified Dr. Nevada Crane via page and awaiting for a call back.

## 2022-01-11 NOTE — Care Management Important Message (Signed)
Important Message  Patient Details  Name: [name] MRN: [id number] Date of Birth: [birth year]   Medicare Important Message Given:  Yes     Orbie Pyo 01/11/2022, 4:00 PM

## 2022-01-11 NOTE — Transfer of Care (Signed)
Immediate Anesthesia Transfer of Care Note  Patient: [name]  Procedure(s) Performed: ESOPHAGOGASTRODUODENOSCOPY (EGD) WITH PROPOFOL  Patient Location: Short Stay  Anesthesia Type:MAC  Level of Consciousness: awake  Airway & Oxygen Therapy: Patient Spontanous Breathing  Post-op Assessment: Report given to RN and Post -op Vital signs reviewed and stable  Post vital signs: Reviewed and stable  Last Vitals:  Vitals Value Taken Time  BP 115/70 01/11/22 1415  Temp 36.4 C 01/11/22 1415  Pulse 74 01/11/22 1415  Resp 18 01/11/22 1415  SpO2 99 % 01/11/22 1415    Last Pain:  Vitals:   01/11/22 1415  TempSrc: Temporal  PainSc: Asleep         Complications: No notable events documented.

## 2022-01-11 NOTE — Progress Notes (Addendum)
[facility] Gastroenterology Progress Note  [name] RNHAFB 81 y.o. January 24, 1941  CC:  Melena   Subjective: Patient states she has not had any stools since yesterday.  Confirmed with RN.  Denies nausea/vomiting.  Denies abdominal pain.  Tolerating clear liquids without difficulty  ROS : Review of Systems  Constitutional:  Negative for chills, fever and weight loss.  Gastrointestinal:  Positive for melena. Negative for abdominal pain, blood in stool, constipation, diarrhea, heartburn, nausea and vomiting.      Objective: Vital signs in last 24 hours: Vitals:   01/11/22 0500 01/11/22 0747  BP:  (!) 102/48  Pulse: 69 87  Resp: 18 19  Temp:  99.3 F (37.4 C)  SpO2: 95% 94%    Physical Exam:  General:  Alert, cooperative, no distress, pale  Head:  Normocephalic, without obvious abnormality, atraumatic  Eyes:  Anicteric sclera, EOM's intact, conjunctival pallor  Lungs:   Clear to auscultation bilaterally, respirations unlabored  Heart:  Regular rate and rhythm, S1, S2 normal  Abdomen:   Soft, non-tender, bowel sounds active all four quadrants,  no masses,     Lab Results: Recent Labs    01/09/22 0211 01/11/22 0832  NA 142 142  K 3.5 3.3*  CL 111 114*  CO2 23 22  GLUCOSE 111* 117*  BUN 10 16  CREATININE 0.47 0.45  CALCIUM 8.4* 7.9*   No results for input(s): "AST", "ALT", "ALKPHOS", "BILITOT", "PROT", "ALBUMIN" in the last 72 hours. Recent Labs    01/09/22 0211 01/10/22 0047 01/10/22 1544 01/11/22 0832  WBC 7.9   < > 9.3 5.8  NEUTROABS 5.4  --  5.3  --   HGB 8.2*   < > 7.7* 6.9*  HCT 25.0*   < > 24.2* 21.0*  MCV 98.0   < > 98.8 97.7  PLT 141*   < > 169 145*   < > = values in this interval not displayed.   No results for input(s): "LABPROT", "INR" in the last 72 hours.    Assessment Acute on chronic anemia, melena -Hgb 6.9 (7.7) -BUN 16, creatinine 0.45 -Positive fecal occult (multiple fecal occult's positive in the past) -Last EGD 05/2021 showed esophageal  plaques suspicous for candidiasis (positive biopsies) treated with diflucan, multiple gastric polyps, normal duodenum.   Diastolic heart failure -Echo 2022 shows ejection fraction 60 to 65%   Plan: No further melena at this moment, though drop in hemoglobin to 6.9. Plan for EGD today at 1330. I thoroughly discussed the procedures to include nature, alternatives, benefits, and risks including but not limited to bleeding, perforation, infection, anesthesia/cardiac and pulmonary complications. Patient provides understanding and gave verbal consent to proceed. Attempted to call Husband to inform him of plan, however, phone went straight to voicemail and patient did not pick up the house phone when called. Continue Protonix IV '40mg'$  BID NPO Continue supportive care as needed. [facility] will follow.     Carlisle Torgeson Radford Pax PA-C 01/11/2022, 10:33 AM  Contact #  [phone]

## 2022-01-11 NOTE — Progress Notes (Signed)
[name] 1:35 PM  Subjective: Patient seen and examined and discussed with PA and no new complaints and no bowel movements today and we discussed an endoscopy just to be sure  Objective: Vital signs stable afebrile no acute distress exam please see preassessment evaluation BUN the same is on the 11th hemoglobin slight drop  Assessment: Melena decreased hemoglobin  Plan: Okay to proceed with endoscopy with anesthesia assistance with further work-up plans diet advancement etc. pending those findings  [facility]  office [phone] After 5PM or if no answer call [phone]

## 2022-01-12 DIAGNOSIS — D62 Acute posthemorrhagic anemia: Secondary | ICD-10-CM

## 2022-01-12 LAB — CBC
HCT: 23.5 % — ABNORMAL LOW (ref 36.0–46.0)
Hemoglobin: 7.8 g/dL — ABNORMAL LOW (ref 12.0–15.0)
MCH: 31.5 pg (ref 26.0–34.0)
MCHC: 33.2 g/dL (ref 30.0–36.0)
MCV: 94.8 fL (ref 80.0–100.0)
Platelets: 127 10*3/uL — ABNORMAL LOW (ref 150–400)
RBC: 2.48 MIL/uL — ABNORMAL LOW (ref 3.87–5.11)
RDW: 16.6 % — ABNORMAL HIGH (ref 11.5–15.5)
WBC: 5.7 10*3/uL (ref 4.0–10.5)
nRBC: 0.4 % — ABNORMAL HIGH (ref 0.0–0.2)

## 2022-01-12 LAB — BASIC METABOLIC PANEL
Anion gap: 6 (ref 5–15)
BUN: 15 mg/dL (ref 8–23)
CO2: 20 mmol/L — ABNORMAL LOW (ref 22–32)
Calcium: 7.8 mg/dL — ABNORMAL LOW (ref 8.9–10.3)
Chloride: 116 mmol/L — ABNORMAL HIGH (ref 98–111)
Creatinine, Ser: 0.48 mg/dL (ref 0.44–1.00)
GFR, Estimated: >60 mL/min (ref >60)
Glucose, Bld: 116 mg/dL — ABNORMAL HIGH (ref 70–99)
Potassium: 3.8 mmol/L (ref 3.5–5.1)
Sodium: 142 mmol/L (ref 135–145)

## 2022-01-12 LAB — GLUCOSE, CAPILLARY: Glucose-Capillary: 111 mg/dL — ABNORMAL HIGH (ref 70–99)

## 2022-01-12 LAB — PREPARE RBC (CROSSMATCH)

## 2022-01-12 MED ORDER — SODIUM CHLORIDE 0.9% IV SOLUTION: Freq: Once | INTRAVENOUS | Status: AC

## 2022-01-12 NOTE — Anesthesia Postprocedure Evaluation (Signed)
Anesthesia Post Note  Patient: [name]  Procedure(s) Performed: ESOPHAGOGASTRODUODENOSCOPY (EGD) WITH PROPOFOL     Patient location during evaluation: Endoscopy Anesthesia Type: MAC Level of consciousness: oriented, awake and alert and awake Pain management: pain level controlled Vital Signs Assessment: post-procedure vital signs reviewed and stable Respiratory status: spontaneous breathing, nonlabored ventilation, respiratory function stable and patient connected to nasal cannula oxygen Cardiovascular status: blood pressure returned to baseline and stable Postop Assessment: no headache, no backache and no apparent nausea or vomiting Anesthetic complications: no   No notable events documented.  Last Vitals:  Vitals:   01/12/22 0722 01/12/22 0739  BP: (!) 115/52   Pulse: 77 80  Resp: 19 20  Temp: 37.2 C   SpO2: 96% 96%    Last Pain:  Vitals:   01/12/22 0722  TempSrc: Oral  PainSc:                  Santa Lighter

## 2022-01-12 NOTE — Progress Notes (Signed)
Hgb and Hct levels reported to Dr. Nevada Crane. New order to transfuse 1 unit of blood.

## 2022-01-12 NOTE — Progress Notes (Signed)
[facility] Gastroenterology Progress Note  [name] ZJQBHA 81 y.o. 26-Feb-1941   Subjective: Sleeping in bedside chair. No black stools reported this morning. Black stool last night. Denies abdominal pain.   Objective: Vital signs: Vitals:   01/12/22 0739 01/12/22 1140  BP:  (!) 121/56  Pulse: 80 (!) 104  Resp: 20 20  Temp:  98 F (36.7 C)  SpO2: 96% 99%    Physical Exam: Gen: lethargic, elderly, no acute distress, well-nourished HEENT: anicteric sclera CV: RRR Chest: CTA B Abd: soft, nontender, nondistended, +BS Ext: no edema  Lab Results: Recent Labs    01/11/22 1005 01/11/22 2146 01/12/22 0653  NA  --  140 142  K  --  3.6 3.8  CL  --  113* 116*  CO2  --  20* 20*  GLUCOSE  --  149* 116*  BUN  --  17 15  CREATININE  --  0.58 0.48  CALCIUM  --  7.8* 7.8*  MG 2.0  --   --    Recent Labs    01/11/22 2146  AST 23  ALT 15  ALKPHOS 32*  BILITOT 0.4  PROT 4.5*  ALBUMIN 2.6*   Recent Labs    01/10/22 1544 01/11/22 0832 01/11/22 2146 01/12/22 0653  WBC 9.3   < > 7.5 5.7  NEUTROABS 5.3  --   --   --   HGB 7.7*   < > 7.3* 7.8*  HCT 24.2*   < > 21.7* 23.5*  MCV 98.8   < > 94.3 94.8  PLT 169   < > 138* 127*   < > = values in this interval not displayed.      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 EGD unrevealing for source of melena. Hgb drop to 6.9 and transfused one unit to 7.8. No further melena today. Manage conservatively but if anemia worsens with ongoing melena then will need to do an inpt colonoscopy and/or capsule endoscopy. Follow H/Hs closely. Supportive care. Will f/u tomorrow.   Lear Ng 01/12/2022, 12:47 PM  Questions please call [phone] Patient ID: [name], female   DOB: 1940/08/30, 81 y.o.   MRN: [id number]

## 2022-01-12 NOTE — Progress Notes (Signed)
Blood transfusion completed without any reaction. Will continue to monitor.

## 2022-01-12 NOTE — Progress Notes (Signed)
Blood transfusion started.

## 2022-01-12 NOTE — Evaluation (Addendum)
Occupational Therapy Evaluation Patient Details Name: [name] MRN: [id number] DOB: [birth year] Today's Date: 01/12/2022   History of Present Illness 81 y.o. female [facility] to [facility]/07/2022 with c/o weakness and blood in her stool. Pt is admitted with a GI bleed. Upper GI endoscopy 01/11/2022 is significant for multiple smale semi-sessile polyps in the gastric body, and a potential tiny hiatal hernia. 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   Clinical Impression   Patient admitted for the diagnosis above.  PTA she was living at a local ALF, had recently participated with [facility] PT, and remarked that she was finally at a place where she was able to make her bed, bathe and dress herself, and was walking with a 4WRW.   Generalized weakness is her primary complaint.  Currently she is needing up to [facility] for mobility, and Min A for lower body ADL.  She is limited based on her vision, and the newness of her environment.  OT will follow in the acute setting, and no post acute OT is anticipated.       Recommendations for follow up therapy are one component of a multi-disciplinary discharge planning process, led by the attending physician.  Recommendations may be updated based on patient status, additional functional criteria and insurance authorization.   Follow Up Recommendations  No OT follow up    Assistance Recommended at Discharge Intermittent Supervision/Assistance  Patient can return home with the following      Functional Status Assessment  Patient has had a recent decline in their functional status and demonstrates the ability to make significant improvements in function in a reasonable and predictable amount of time.  Equipment Recommendations  None recommended by OT    Recommendations for Other Services       Precautions / Restrictions Precautions Precautions: Fall Restrictions Weight Bearing Restrictions: No       Mobility Bed Mobility Overal bed mobility: Needs Assistance Bed Mobility: Sit to Supine       Sit to supine: Min assist        Transfers Overall transfer level: Needs assistance Equipment used: Rolling walker (2 wheels) Transfers: Sit to/from Stand, Bed to chair/wheelchair/BSC Sit to Stand: Min guard     Step pivot transfers: Min guard            Balance Overall balance assessment: Needs assistance Sitting-balance support: Feet supported Sitting balance-Leahy Scale: Good     Standing balance support: Reliant on assistive device for balance Standing balance-Leahy Scale: Fair                             ADL either performed or assessed with clinical judgement   ADL       Grooming: Wash/dry hands;Wash/dry face;Set up;Sitting               Lower Body Dressing: Minimal assistance;Sit to/from stand   Toilet Transfer: Min guard;Rolling walker (2 wheels)                   Vision Baseline Vision/History: 1 Wears glasses;2 Legally blind Patient Visual Report: No change from baseline       Perception Perception Perception: Not tested   Praxis Praxis Praxis: Not tested    Pertinent Vitals/Pain Pain Assessment Pain Assessment: No/denies pain     Hand Dominance Right   Extremity/Trunk Assessment Upper Extremity Assessment Upper Extremity Assessment: Generalized  weakness   Lower Extremity Assessment Lower Extremity Assessment: Defer to PT evaluation   Cervical / Trunk Assessment Cervical / Trunk Assessment: Kyphotic   Communication Communication Communication: HOH   Cognition Arousal/Alertness: Awake/alert Behavior During Therapy: WFL for tasks assessed/performed Overall Cognitive Status: Within Functional Limits for tasks assessed                                       General Comments   VSS on RA    Exercises     Shoulder Instructions      Home Living Family/patient expects to be discharged to::  Assisted living                             Home Equipment: Transport chair;Cane - single Barista (2 wheels);Rollator (4 wheels);Shower seat;Grab bars - toilet;Grab bars - tub/shower;Hand held shower head          Prior Functioning/Environment Prior Level of Function : Needs assist             Mobility Comments: Pt ambulates with RW; uses transport chair for longer distances ADLs Comments: assist with bathing, some dressing; spouse assists as needed.  spouse assist with IADLs.        OT Problem List: Decreased strength;Impaired balance (sitting and/or standing);Pain      OT Treatment/Interventions: Self-care/ADL training;Therapeutic exercise;Therapeutic activities;Patient/family education;Balance training    OT Goals(Current goals can be found in the care plan section) Acute Rehab OT Goals Patient Stated Goal: Return home and regain independence OT Goal Formulation: With patient Potential to Achieve Goals: Good ADL Goals Pt Will Perform Grooming: with set-up;standing Pt Will Perform Lower Body Dressing: with supervision;sit to/from stand Pt Will Transfer to Toilet: with supervision;ambulating;regular height toilet Pt/caregiver will Perform Home Exercise Program: Increased strength;Both right and left upper extremity;With theraband;With Supervision  OT Frequency: Min 2X/week    Co-evaluation              AM-PAC OT "6 Clicks" Daily Activity     Outcome Measure Help from another person eating meals?: None Help from another person taking care of personal grooming?: None Help from another person toileting, which includes using toliet, bedpan, or urinal?: A Little Help from another person bathing (including washing, rinsing, drying)?: A Little Help from another person to put on and taking off regular upper body clothing?: None Help from another person to put on and taking off regular lower body clothing?: A Little 6 Click Score: 21   End of  Session Equipment Utilized During Treatment: Rolling walker (2 wheels) Nurse Communication: Mobility status  Activity Tolerance: Patient tolerated treatment well Patient left: in bed;with call bell/phone within reach;with family/visitor present  OT Visit Diagnosis: Unsteadiness on feet (R26.81);Muscle weakness (generalized) (M62.81);Pain Pain - Right/Left: Right Pain - part of body: Shoulder                Time: 1348-1410 OT Time Calculation (min): 22 min Charges:  OT General Charges $OT Visit: 1 Visit OT Evaluation $OT Eval Moderate Complexity: 1 Mod  01/12/2022  RP, OTR/L  Acute Rehabilitation Services  Office:  [phone]   Metta Clines 01/12/2022, 2:21 PM

## 2022-01-12 NOTE — Progress Notes (Signed)
PROGRESS NOTE                                                                                                                                                                                                             Patient Demographics:    [name], is a 81 y.o. female, DOB - 03/14/1941, VOZ:[id number]  Outpatient Primary MD for the patient is [name], MD    LOS - 5  Admit date - 01/07/2022    Chief Complaint  Patient presents with   GI Problem       Brief Narrative (HPI from H&P)   81 y.o. 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 Patient returns to ED with close to 1 week of dark stools and intermittent brbpr. She was found to have anemia and admitted to the hospital.   Subjective:    [name] today has, No headache, No chest pain, No abdominal pain - No Nausea, No new weakness tingling or numbness, no SOB   Assessment  & Plan :    Acute GI blood loss upper versus lower.  Cause unclear.  She was Hemoccult positive with some episodes of dark stool but some bright red blood per rectum as well.  She is s/p 1 unit of packed RBC transfusion on 01/11/2022, on PPI, fresh bleeding seems to have stopped GI has seen the patient underwent EGD on 01/11/2022 which was nonacute.  Continue to monitor.   COPD.  On nocturnal oxygen.  Stable no exacerbation, continue supportive care  History of pulmonary nodules.  Outpatient PCP directed age-appropriate work-up.  Hypertension.  For now blood pressure is stable off of medications.  Anxiety and depression.  Continue home medications no acute issues  Dyslipidemia.  On statin.       Condition - Extremely Guarded  Family Communication   :  none present  Code Status :  DNR  Consults  :  GI  PUD Prophylaxis : PPI   Procedures  :     EGD - 01/11/22 - Non acute      Disposition Plan  :    Status is: Inpatient  DVT Prophylaxis  :    SCDs Start: 01/07/22 2332     Lab Results  Component Value Date   PLT 127 (L) 01/12/2022    Diet :  Diet Order             DIET SOFT Room service appropriate? No; Fluid consistency: Thin  Diet effective now                    Inpatient Medications  Scheduled Meds:  dorzolamide-timolol  1 drop Both Eyes BID   ferrous gluconate  324 mg Oral q morning   latanoprost  1 drop Both Eyes QHS   Netarsudil Dimesylate  1 drop Both Eyes QHS   pantoprazole  40 mg Oral Daily   propranolol  20 mg Oral QHS   sertraline  100 mg Oral Daily   simvastatin  20 mg Oral QPM   umeclidinium-vilanterol  1 puff Inhalation Daily   Continuous Infusions: PRN Meds:.acetaminophen **OR** [DISCONTINUED] acetaminophen, albuterol, HYDROcodone-acetaminophen, lip balm, [DISCONTINUED] ondansetron **OR** ondansetron (ZOFRAN) IV, propranolol, rOPINIRole  Antibiotics  :    Anti-infectives (From admission, onward)    None        Time Spent in minutes  30   Lala Lund M.D on 01/12/2022 at 12:30 PM  To page go to [email or web address]   [facility] -  Office  [phone]  See all Orders from today for further details    Objective:   Vitals:   01/12/22 0600 01/12/22 0722 01/12/22 0739 01/12/22 1140  BP: (!) 115/46 (!) 115/52  (!) 121/56  Pulse: 71 77 80 (!) 104  Resp: '20 19 20 20  '$ Temp:  99 F (37.2 C)  98 F (36.7 C)  TempSrc:  Oral  Oral  SpO2: 96% 96% 96% 99%  Weight:      Height:        Wt Readings from Last 3 Encounters:  01/12/22 77.3 kg  05/03/21 81.6 kg  02/01/21 81.6 kg     Intake/Output Summary (Last 24 hours) at 01/12/2022 1230 Last data filed at 01/12/2022 3903 Gross per 24 hour  Intake 2003.8 ml  Output --  Net 2003.8 ml     Physical  Exam  Awake Alert, No new F.N deficits, Normal affect [place].AT,PERRAL Supple Neck, No JVD,   Symmetrical Chest wall movement, Good air movement bilaterally, CTAB RRR,No Gallops,Rubs or new Murmurs,  +ve B.Sounds, Abd Soft, No tenderness,   No Cyanosis, Clubbing or edema     RN pressure injury documentation: Pressure Injury 12/20/20 Elbow Anterior;Right (Active)  12/20/20 1300  Location: Elbow  Location Orientation: Anterior;Right  Staging:   Wound Description (Comments):   Present on Admission:      Data Review:    CBC Recent Labs  Lab 01/07/22 1950 01/07/22 2334 01/09/22 0211 01/10/22 0047 01/10/22 1544 01/11/22 0832 01/11/22 1935 01/11/22 2146 01/12/22 0653  WBC 8.4   < > 7.9 8.7 9.3 5.8  --  7.5 5.7  HGB 9.6*   < > 8.2* 7.8* 7.7* 6.9* 7.8* 7.3* 7.8*  HCT 29.5*   < > 25.0* 23.3* 24.2* 21.0* 24.1* 21.7* 23.5*  PLT 156   < > 141* 135* 169 145*  --  138* 127*  MCV 98.0   < > 98.0 96.3 98.8 97.7  --  94.3 94.8  MCH 31.9   < > 32.2 32.2 31.4 32.1  --  31.7 31.5  MCHC 32.5   < > 32.8 33.5 31.8 32.9  --  33.6  33.2  RDW 14.4   < > 14.9 14.8 15.0 15.1  --  16.5* 16.6*  LYMPHSABS 1.5  --  1.4  --  2.6  --   --   --   --   MONOABS 0.7  --  0.9  --  1.1*  --   --   --   --   EOSABS 0.1  --  0.1  --  0.2  --   --   --   --   BASOSABS 0.1  --  0.0  --  0.0  --   --   --   --    < > = values in this interval not displayed.    Electrolytes Recent Labs  Lab 01/07/22 1950 01/07/22 2016 01/08/22 0500 01/09/22 0211 01/10/22 1258 01/10/22 1500 01/11/22 0832 01/11/22 1005 01/11/22 2146 01/12/22 0653  NA 137  --  143 142  --   --  142  --  140 142  K 3.9  --  3.8 3.5  --   --  3.3*  --  3.6 3.8  CL 104  --  104 111  --   --  114*  --  113* 116*  CO2 23  --  25 23  --   --  22  --  20* 20*  GLUCOSE 159*  --  117* 111*  --   --  117*  --  149* 116*  BUN 17  --  16 10  --   --  16  --  17 15  CREATININE 0.59  --  0.63 0.47  --   --  0.45  --  0.58 0.48  CALCIUM 8.6*  --   8.4* 8.4*  --   --  7.9*  --  7.8* 7.8*  AST 19  --  16  --   --   --   --   --  23  --   ALT 14  --  13  --   --   --   --   --  15  --   ALKPHOS 48  --  36*  --   --   --   --   --  32*  --   BILITOT 0.4  --  0.4  --   --   --   --   --  0.4  --   ALBUMIN 3.5  --  3.0*  --   --   --   --   --  2.6*  --   MG  --   --   --   --   --   --   --  2.0  --   --   LATICACIDVEN  --  1.2  --   --  1.2 2.5* 0.9  --   --   --   INR 1.1  --  1.2  --   --   --   --   --   --   --        Micro Results No results found for this or any previous visit (from the past 240 hour(s)).  Radiology Reports No results found.

## 2022-01-12 NOTE — Evaluation (Signed)
Physical Therapy Evaluation Patient Details Name: [name] MRN: [id number] DOB: August 05, 1940 Today's Date: 01/12/2022  History of Present Illness  81 y.o. female presents to [facility] on 01/07/2022 with c/o weakness and blood in her stool. Pt is admitted with a GI bleed. Upper GI endoscopy 01/11/2022 is significant for multiple smale semi-sessile polyps in the gastric body, and a potential tiny hiatal hernia. 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  Clinical Impression  Pt presents with PT impairments of generalized weakness, activity intolerance, and gait and balance deficits. Pt tolerates therapy well today, ambulating limited household distances with a RW (baseline) and min guard during transfers. Continued therapy will facilitate the pt in improving strength and activity tolerance to progress her towards her baseline level of independence.        Recommendations for follow up therapy are one component of a multi-disciplinary discharge planning process, led by the attending physician.  Recommendations may be updated based on patient status, additional functional criteria and insurance authorization.  Follow Up Recommendations Home health PT (As long as ALF can provide)      Assistance Recommended at Discharge Intermittent Supervision/Assistance  Patient can return home with the following  A little help with walking and/or transfers;A little help with bathing/dressing/bathroom;Assistance with cooking/housework;Assist for transportation;Help with stairs or ramp for entrance    Equipment Recommendations None recommended by PT  Recommendations for Other Services       Functional Status Assessment Patient has had a recent decline in their functional status and demonstrates the ability to make significant improvements in function in a reasonable and predictable amount of time.     Precautions / Restrictions Precautions Precautions:  Fall Restrictions Weight Bearing Restrictions: No      Mobility  Bed Mobility Overal bed mobility: Modified Independent                  Transfers Overall transfer level: Needs assistance Equipment used: Rolling walker (2 wheels) Transfers: Sit to/from Stand Sit to Stand: Min guard                Ambulation/Gait Ambulation/Gait assistance: Min guard Gait Distance (Feet): 10 Feet (Additional trial of 10' after patient uses BSC) Assistive device: Rolling walker (2 wheels) Gait Pattern/deviations: Step-to pattern, Decreased step length - right, Decreased step length - left, Decreased stride length, Shuffle Gait velocity: decreased Gait velocity interpretation: <1.31 ft/sec, indicative of household ambulator   General Gait Details: Pt asks for verbal cues for which way to turn due to lines; able to turn either direction without LOB; Pt is very slow and guarded throughout ambulation  Stairs            Wheelchair Mobility    Modified Rankin (Stroke Patients Only)       Balance Overall balance assessment: Needs assistance Sitting-balance support: No upper extremity supported, Feet supported Sitting balance-Leahy Scale: Fair     Standing balance support: Bilateral upper extremity supported, Reliant on assistive device for balance Standing balance-Leahy Scale: Poor                               Pertinent Vitals/Pain Pain Assessment Pain Assessment: No/denies pain    Home Living Family/patient expects to be discharged to:: Assisted living ([facility])                 Home Equipment: Administrator, [employer] -  single point;Rolling Walker (2 wheels);Rollator (4 wheels) (Pt reports she doesn't feel stable using Rollator so she doesn't have it at [facility] where she currently resides.)      Prior Function Prior Level of Function : Independent/Modified Independent;Needs assist (Pt reports she was recently cleared to bathe  herself, but has needed assistance with this before.)       Physical Assist : ADLs (physical)   ADLs (physical): Bathing;IADLs Mobility Comments: Pt ambulates with RW; uses transport chair for longer distances       Hand Dominance        Extremity/Trunk Assessment   Upper Extremity Assessment Upper Extremity Assessment: Generalized weakness    Lower Extremity Assessment Lower Extremity Assessment: Generalized weakness    Cervical / Trunk Assessment Cervical / Trunk Assessment: Kyphotic  Communication   Communication: HOH (Pt has hearing aids but batteries are dead)  Cognition Arousal/Alertness: Awake/alert Behavior During Therapy: WFL for tasks assessed/performed Overall Cognitive Status: Within Functional Limits for tasks assessed                                          General Comments General comments (skin integrity, edema, etc.): VSS on RA    Exercises     Assessment/Plan    PT Assessment Patient needs continued PT services  PT Problem List Decreased strength;Decreased activity tolerance;Decreased balance;Decreased mobility;Decreased coordination;Cardiopulmonary status limiting activity (Coordination deficits seem to be secondary to visual impairments)       PT Treatment Interventions DME instruction;Gait training;Stair training;Functional mobility training;Therapeutic activities;Therapeutic exercise;Balance training;Patient/family education    PT Goals (Current goals can be found in the Care Plan section)  Acute Rehab PT Goals Patient Stated Goal: "Get around better to not be a bother to anyone" PT Goal Formulation: With patient Time For Goal Achievement: 01/26/22 Potential to Achieve Goals: Good    Frequency Min 3X/week     Co-evaluation               AM-PAC PT "6 Clicks" Mobility  Outcome Measure Help needed turning from your back to your side while in a flat bed without using bedrails?: None Help needed moving from  lying on your back to sitting on the side of a flat bed without using bedrails?: None Help needed moving to and from a bed to a chair (including a wheelchair)?: A Little Help needed standing up from a chair using your arms (e.g., wheelchair or bedside chair)?: A Little Help needed to walk in hospital room?: A Little Help needed climbing 3-5 steps with a railing? : A Lot 6 Click Score: 19    End of Session   Activity Tolerance: Patient tolerated treatment well Patient left: in chair;with call bell/phone within reach;with chair alarm set;Other (comment) (With MD in room) Nurse Communication: Mobility status PT Visit Diagnosis: Other abnormalities of gait and mobility (R26.89);Unsteadiness on feet (R26.81);Muscle weakness (generalized) (M62.81);Difficulty in walking, not elsewhere classified (R26.2)    Time: 6314-9702 PT Time Calculation (min) (ACUTE ONLY): 42 min   Charges:   PT Evaluation $PT Eval Low Complexity: 1 Low PT Treatments $Gait Training: 8-22 mins $Therapeutic Activity: 8-22 mins        Hall Busing, SPT Acute Rehabilitation Office #: [phone]   Hall Busing 01/12/2022, 11:05 AM

## 2022-01-13 ENCOUNTER — Encounter (HOSPITAL_COMMUNITY): Payer: Self-pay | Admitting: Gastroenterology

## 2022-01-13 DIAGNOSIS — D62 Acute posthemorrhagic anemia: Secondary | ICD-10-CM | POA: Diagnosis not present

## 2022-01-13 LAB — CBC WITH DIFFERENTIAL/PLATELET
Abs Immature Granulocytes: 0.02 10*3/uL (ref 0.00–0.07)
Basophils Absolute: 0 10*3/uL (ref 0.0–0.1)
Basophils Relative: 0 %
Eosinophils Absolute: 0.3 10*3/uL (ref 0.0–0.5)
Eosinophils Relative: 4 %
HCT: 22.3 % — ABNORMAL LOW (ref 36.0–46.0)
Hemoglobin: 7.4 g/dL — ABNORMAL LOW (ref 12.0–15.0)
Immature Granulocytes: 0 %
Lymphocytes Relative: 24 %
Lymphs Abs: 1.6 10*3/uL (ref 0.7–4.0)
MCH: 30.7 pg (ref 26.0–34.0)
MCHC: 33.2 g/dL (ref 30.0–36.0)
MCV: 92.5 fL (ref 80.0–100.0)
Monocytes Absolute: 1 10*3/uL (ref 0.1–1.0)
Monocytes Relative: 15 %
Neutro Abs: 3.9 10*3/uL (ref 1.7–7.7)
Neutrophils Relative %: 57 %
Platelets: 138 10*3/uL — ABNORMAL LOW (ref 150–400)
RBC: 2.41 MIL/uL — ABNORMAL LOW (ref 3.87–5.11)
RDW: 16.7 % — ABNORMAL HIGH (ref 11.5–15.5)
WBC: 6.7 10*3/uL (ref 4.0–10.5)
nRBC: 0.4 % — ABNORMAL HIGH (ref 0.0–0.2)

## 2022-01-13 LAB — BASIC METABOLIC PANEL
Anion gap: 8 (ref 5–15)
BUN: 10 mg/dL (ref 8–23)
CO2: 23 mmol/L (ref 22–32)
Calcium: 8.5 mg/dL — ABNORMAL LOW (ref 8.9–10.3)
Chloride: 110 mmol/L (ref 98–111)
Creatinine, Ser: 0.59 mg/dL (ref 0.44–1.00)
GFR, Estimated: >60 mL/min (ref >60)
Glucose, Bld: 123 mg/dL — ABNORMAL HIGH (ref 70–99)
Potassium: 3.7 mmol/L (ref 3.5–5.1)
Sodium: 141 mmol/L (ref 135–145)

## 2022-01-13 LAB — GLUCOSE, CAPILLARY
Glucose-Capillary: 112 mg/dL — ABNORMAL HIGH (ref 70–99)
Glucose-Capillary: 146 mg/dL — ABNORMAL HIGH (ref 70–99)

## 2022-01-13 LAB — MAGNESIUM: Magnesium: 1.9 mg/dL (ref 1.7–2.4)

## 2022-01-13 LAB — PREPARE RBC (CROSSMATCH)

## 2022-01-13 MED ORDER — SODIUM CHLORIDE 0.9% IV SOLUTION: Freq: Once | INTRAVENOUS | Status: AC

## 2022-01-13 NOTE — TOC Initial Note (Signed)
Transition of Care [facility]) - Initial/Assessment Note    Patient Details  Name: [name] MRN: [id number] Date of Birth: Oct 10, 1940  [facility] CM/SW Contact:    Carles Collet, RN Phone Number: 01/13/2022, 11:43 AM  Clinical Narrative:               Damaris Schooner w patient at bedside. She states that she is from [facility], she was recently getting PT and OT through their in house provider and was still active with OT at time of admission. She would like to reinstate these services at [facility] and return there at time of DC. Currently getting blood transfusion. TOC will continue to follow.     Expected Discharge Plan: Assisted Living Barriers to Discharge: Continued Medical Work up   Patient Goals and CMS Choice Patient states their goals for this hospitalization and ongoing recovery are:: return to [facility] and continue therapy services she had there   Choice offered to / list presented to : Patient  Expected Discharge Plan and Services Expected Discharge Plan: Assisted Living In-house Referral: Clinical Social Work Discharge Planning Services: CM Consult   Living arrangements for the past 2 months: Apartment                                      Prior Living Arrangements/Services Living arrangements for the past 2 months: Apartment Lives with:: Self                   Activities of Daily Living      Permission Sought/Granted                  Emotional Assessment              Admission diagnosis:  GI bleed [K92.2] Patient Active Problem List   Diagnosis Date Noted   GI bleed 01/07/2022   Acute blood loss anemia 05/02/2021   Symptomatic anemia    DOE (dyspnea on exertion) 11/18/2017   COPD GOLD II  11/18/2017   Diverticulosis of intestine with bleeding 09/05/2017   Influenza A 08/08/2017   Acute respiratory failure with hypoxia ([place]) 08/07/2017   Depression 08/07/2017   Hyperlipemia 08/07/2017   Macular  degeneration 08/07/2017   Glaucoma 08/07/2017   Weakness 08/07/2017   Dehydration 08/07/2017   Lower GI bleeding 10/14/2016   Diverticulosis of colon 10/14/2016   Anxiety 10/14/2016   Lower GI bleed 10/14/2016   Acute GI bleeding    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   Dermatochalasis of both upper eyelids 08/17/2014   Osteoarthrosis, unspecified whether generalized or localized, lower leg 12/20/2011   PCP:  [name], MD Pharmacy:  No Pharmacies Listed    Social Determinants of Health (SDOH) Interventions    Readmission Risk Interventions    05/10/2021   11:32 AM  Readmission Risk Prevention Plan  Transportation Screening Complete  PCP or Specialist Appt within 3-5 Days Complete  HRI or Home Care Consult Complete  Social Work Consult for [place] Planning/Counseling Patient refused  Palliative Care Screening Not Applicable  Medication Review Press photographer) Complete

## 2022-01-13 NOTE — Progress Notes (Signed)
PROGRESS NOTE                                                                                                                                                                                                             Patient Demographics:    [name], is a 81 y.o. female, DOB - 1940/12/16, LEX:[id number]  Outpatient Primary MD for the patient is [name], MD    LOS - 6  Admit date - 01/07/2022    Chief Complaint  Patient presents with   GI Problem       Brief Narrative (HPI from H&P)   81 y.o. 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 Patient returns to ED with close to 1 week of dark stools and intermittent brbpr. She was found to have anemia and admitted to the hospital.   Subjective:   Patient in bed, appears comfortable, denies any headache, no fever, no chest pain or pressure, no shortness of breath , no abdominal pain. No new focal weakness.    Assessment  & Plan :    Acute GI blood loss upper versus lower.  Cause unclear.  She was Hemoccult positive with some episodes of dark stool but some bright red blood per rectum as well.  She is s/p 1 unit of packed RBC transfusion on 01/11/2022, giving her another unit of packed RBC on 01/13/2022, on PPI, fresh bleeding seems to have stopped GI has seen the patient underwent EGD on 01/11/2022 which was nonacute.  Continue to monitor.   COPD.  On nocturnal oxygen.  Stable no exacerbation, continue supportive care  History of pulmonary nodules.  Outpatient PCP directed age-appropriate work-up.  Hypertension.  For now blood pressure is stable off of medications.  Anxiety and depression.  Continue home medications no acute  issues  Dyslipidemia.  On statin.       Condition - Extremely Guarded  Family Communication  :  none present  Code Status :  DNR  Consults  :  GI  PUD Prophylaxis : PPI   Procedures  :     EGD - 01/11/22 - Non acute  Disposition Plan  :    Status is: Inpatient   DVT Prophylaxis  :    SCDs Start: 01/07/22 2332     Lab Results  Component Value Date   PLT 138 (L) 01/13/2022    Diet :  Diet Order             DIET SOFT Room service appropriate? No; Fluid consistency: Thin  Diet effective now                    Inpatient Medications  Scheduled Meds:  sodium chloride   Intravenous Once   dorzolamide-timolol  1 drop Both Eyes BID   ferrous gluconate  324 mg Oral q morning   latanoprost  1 drop Both Eyes QHS   Netarsudil Dimesylate  1 drop Both Eyes QHS   pantoprazole  40 mg Oral Daily   propranolol  20 mg Oral QHS   sertraline  100 mg Oral Daily   simvastatin  20 mg Oral QPM   umeclidinium-vilanterol  1 puff Inhalation Daily   Continuous Infusions: PRN Meds:.acetaminophen **OR** [DISCONTINUED] acetaminophen, albuterol, HYDROcodone-acetaminophen, lip balm, [DISCONTINUED] ondansetron **OR** ondansetron (ZOFRAN) IV, propranolol, rOPINIRole  Antibiotics  :    Anti-infectives (From admission, onward)    None        Time Spent in minutes  30   Lala Lund M.D on 01/13/2022 at 11:02 AM  To page go to [email or web address]   [facility] -  Office  [phone]  See all Orders from today for further details    Objective:   Vitals:   01/13/22 0750 01/13/22 0759 01/13/22 1032 01/13/22 1035  BP: (!) 118/49  (!) 111/44   Pulse: 78 78 97   Resp: '20 18 18   '$ Temp: 99.2 F (37.3 C)  99.2 F (37.3 C) 99.1 F (37.3 C)  TempSrc: Oral  Oral Oral  SpO2: 95% 95% 98%   Weight:      Height:        Wt Readings from Last 3 Encounters:  01/13/22 73.6 kg  05/03/21 81.6 kg  02/01/21 81.6 kg     Intake/Output Summary (Last 24 hours) at  01/13/2022 1102 Last data filed at 01/13/2022 0403 Gross per 24 hour  Intake 240 ml  Output 1100 ml  Net -860 ml     Physical Exam  Awake Alert, No new F.N deficits, Normal affect [place].AT,PERRAL Supple Neck, No JVD,   Symmetrical Chest wall movement, Good air movement bilaterally, CTAB RRR,No Gallops, Rubs or new Murmurs,  +ve B.Sounds, Abd Soft, No tenderness,   No Cyanosis, Clubbing or edema    RN pressure injury documentation: Pressure Injury 12/20/20 Elbow Anterior;Right (Active)  12/20/20 1300  Location: Elbow  Location Orientation: Anterior;Right  Staging:   Wound Description (Comments):   Present on Admission:      Data Review:    CBC Recent Labs  Lab 01/07/22 1950 01/07/22 2334 01/09/22 0211 01/10/22 0047 01/10/22 1544 01/11/22 0832 01/11/22 1935 01/11/22 2146 01/12/22 0653 01/13/22 0035  WBC 8.4   < > 7.9   < > 9.3 5.8  --  7.5 5.7 6.7  HGB 9.6*   < > 8.2*   < > 7.7* 6.9* 7.8* 7.3* 7.8* 7.4*  HCT 29.5*   < > 25.0*   < > 24.2* 21.0* 24.1* 21.7* 23.5* 22.3*  PLT 156   < > 141*   < > 169 145*  --  138* 127* 138*  MCV 98.0   < >  98.0   < > 98.8 97.7  --  94.3 94.8 92.5  MCH 31.9   < > 32.2   < > 31.4 32.1  --  31.7 31.5 30.7  MCHC 32.5   < > 32.8   < > 31.8 32.9  --  33.6 33.2 33.2  RDW 14.4   < > 14.9   < > 15.0 15.1  --  16.5* 16.6* 16.7*  LYMPHSABS 1.5  --  1.4  --  2.6  --   --   --   --  1.6  MONOABS 0.7  --  0.9  --  1.1*  --   --   --   --  1.0  EOSABS 0.1  --  0.1  --  0.2  --   --   --   --  0.3  BASOSABS 0.1  --  0.0  --  0.0  --   --   --   --  0.0   < > = values in this interval not displayed.    Electrolytes Recent Labs  Lab 01/07/22 1950 01/07/22 2016 01/08/22 0500 01/09/22 0211 01/10/22 1258 01/10/22 1500 01/11/22 0832 01/11/22 1005 01/11/22 2146 01/12/22 0653 01/13/22 0035  NA 137  --  143 142  --   --  142  --  140 142 141  K 3.9  --  3.8 3.5  --   --  3.3*  --  3.6 3.8 3.7  CL 104  --  104 111  --   --  114*  --  113*  116* 110  CO2 23  --  25 23  --   --  22  --  20* 20* 23  GLUCOSE 159*  --  117* 111*  --   --  117*  --  149* 116* 123*  BUN 17  --  16 10  --   --  16  --  '17 15 10  '$ CREATININE 0.59  --  0.63 0.47  --   --  0.45  --  0.58 0.48 0.59  CALCIUM 8.6*  --  8.4* 8.4*  --   --  7.9*  --  7.8* 7.8* 8.5*  AST 19  --  16  --   --   --   --   --  23  --   --   ALT 14  --  13  --   --   --   --   --  15  --   --   ALKPHOS 48  --  36*  --   --   --   --   --  32*  --   --   BILITOT 0.4  --  0.4  --   --   --   --   --  0.4  --   --   ALBUMIN 3.5  --  3.0*  --   --   --   --   --  2.6*  --   --   MG  --   --   --   --   --   --   --  2.0  --   --  1.9  LATICACIDVEN  --  1.2  --   --  1.2 2.5* 0.9  --   --   --   --   INR 1.1  --  1.2  --   --   --   --   --   --   --   --  Micro Results No results found for this or any previous visit (from the past 240 hour(s)).  Radiology Reports No results found.

## 2022-01-13 NOTE — Progress Notes (Signed)
[facility] Gastroenterology Progress Note  [name] GNOIBB 81 y.o. 15-Dec-1940   Subjective: Lying in bed. No complaints. Ate solid food. Does not think she had a BM yesterday (none documented).  Objective: Vital signs: Vitals:   01/13/22 0750 01/13/22 0759  BP: (!) 118/49   Pulse: 78 78  Resp: 20 18  Temp: 99.2 F (37.3 C)   SpO2: 95% 95%    Physical Exam: Gen: lethargic, elderly, well-nourished, no acute distress  HEENT: anicteric sclera CV: RRR Chest: CTA B Abd: soft, nontender, nondistended, +BS Ext: no edema  Lab Results: Recent Labs    01/11/22 1005 01/11/22 2146 01/12/22 0653 01/13/22 0035  NA  --    < > 142 141  K  --    < > 3.8 3.7  CL  --    < > 116* 110  CO2  --    < > 20* 23  GLUCOSE  --    < > 116* 123*  BUN  --    < > 15 10  CREATININE  --    < > 0.48 0.59  CALCIUM  --    < > 7.8* 8.5*  MG 2.0  --   --  1.9   < > = values in this interval not displayed.   Recent Labs    01/11/22 2146  AST 23  ALT 15  ALKPHOS 32*  BILITOT 0.4  PROT 4.5*  ALBUMIN 2.6*   Recent Labs    01/10/22 1544 01/11/22 0832 01/12/22 0653 01/13/22 0035  WBC 9.3   < > 5.7 6.7  NEUTROABS 5.3  --   --  3.9  HGB 7.7*   < > 7.8* 7.4*  HCT 24.2*   < > 23.5* 22.3*  MCV 98.8   < > 94.8 92.5  PLT 169   < > 127* 138*   < > = values in this interval not displayed.      Assessment/Plan: GI bleed - no rectal bleeding or melena reported yesterday or today. Hgb 7.4. Tolerating solid food. 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 Manage conservatively. Will sign off. Call us back if needed.   Lear Ng 01/13/2022, 10:24 AM  Questions please call [phone] Patient ID: [name], female   DOB: 01/15/1941, 81 y.o.   MRN: [id number]

## 2022-01-14 DIAGNOSIS — Z7401 Bed confinement status: Secondary | ICD-10-CM | POA: Diagnosis not present

## 2022-01-14 DIAGNOSIS — K922 Gastrointestinal hemorrhage, unspecified: Secondary | ICD-10-CM | POA: Diagnosis not present

## 2022-01-14 DIAGNOSIS — M2550 Pain in unspecified joint: Secondary | ICD-10-CM | POA: Diagnosis not present

## 2022-01-14 LAB — BASIC METABOLIC PANEL
Anion gap: 5 (ref 5–15)
BUN: 11 mg/dL (ref 8–23)
CO2: 26 mmol/L (ref 22–32)
Calcium: 8.5 mg/dL — ABNORMAL LOW (ref 8.9–10.3)
Chloride: 110 mmol/L (ref 98–111)
Creatinine, Ser: 0.58 mg/dL (ref 0.44–1.00)
GFR, Estimated: >60 mL/min (ref >60)
Glucose, Bld: 125 mg/dL — ABNORMAL HIGH (ref 70–99)
Potassium: 3.8 mmol/L (ref 3.5–5.1)
Sodium: 141 mmol/L (ref 135–145)

## 2022-01-14 LAB — CBC WITH DIFFERENTIAL/PLATELET
Abs Immature Granulocytes: 0.04 10*3/uL (ref 0.00–0.07)
Basophils Absolute: 0 10*3/uL (ref 0.0–0.1)
Basophils Relative: 1 %
Eosinophils Absolute: 0.2 10*3/uL (ref 0.0–0.5)
Eosinophils Relative: 3 %
HCT: 27.9 % — ABNORMAL LOW (ref 36.0–46.0)
Hemoglobin: 8.9 g/dL — ABNORMAL LOW (ref 12.0–15.0)
Immature Granulocytes: 1 %
Lymphocytes Relative: 27 %
Lymphs Abs: 1.8 10*3/uL (ref 0.7–4.0)
MCH: 30.4 pg (ref 26.0–34.0)
MCHC: 31.9 g/dL (ref 30.0–36.0)
MCV: 95.2 fL (ref 80.0–100.0)
Monocytes Absolute: 0.8 10*3/uL (ref 0.1–1.0)
Monocytes Relative: 13 %
Neutro Abs: 3.7 10*3/uL (ref 1.7–7.7)
Neutrophils Relative %: 55 %
Platelets: 151 10*3/uL (ref 150–400)
RBC: 2.93 MIL/uL — ABNORMAL LOW (ref 3.87–5.11)
RDW: 16.3 % — ABNORMAL HIGH (ref 11.5–15.5)
WBC: 6.6 10*3/uL (ref 4.0–10.5)
nRBC: 0 % (ref 0.0–0.2)

## 2022-01-14 LAB — MAGNESIUM: Magnesium: 2.1 mg/dL (ref 1.7–2.4)

## 2022-01-14 LAB — GLUCOSE, CAPILLARY: Glucose-Capillary: 131 mg/dL — ABNORMAL HIGH (ref 70–99)

## 2022-01-14 NOTE — Discharge Instructions (Signed)
Follow with Primary MD [name], MD in 7 days   Get CBC, CMP, 2 view Chest X ray -  checked next visit within 1 week by Primary MD   Activity: As tolerated with Full fall precautions use walker/cane & assistance as needed  Disposition ALF  Diet: Heart Healthy    Special Instructions: If you have smoked or chewed Tobacco  in the last 2 yrs please stop smoking, stop any regular Alcohol  and or any Recreational drug use.  On your next visit with your primary care physician please Get Medicines reviewed and adjusted.  Please request your Prim.MD to go over all Hospital Tests and Procedure/Radiological results at the follow up, please get all Hospital records sent to your Prim MD by signing hospital release before you go home.  If you experience worsening of your admission symptoms, develop shortness of breath, life threatening emergency, suicidal or homicidal thoughts you must seek medical attention immediately by calling 911 or calling your MD immediately  if symptoms less severe.  You Must read complete instructions/literature along with all the possible adverse reactions/side effects for all the Medicines you take and that have been prescribed to you. Take any new Medicines after you have completely understood and accpet all the possible adverse reactions/side effects.

## 2022-01-14 NOTE — NC FL2 (Signed)
[place] LEVEL OF CARE SCREENING TOOL     IDENTIFICATION  Patient Name: [name] Birthdate: January 03, 1941 Sex: female Admission Date (Current Location): 01/07/2022  [facility] and [place] Number:  Herbalist and Address:  [facility] [place]. [facility] [place] [address] [place], [place] [address]      Provider Number: [id number]  Attending Physician Name and Address:  Thurnell Lose, MD  Relative Name and Phone Number:       Current Level of Care: Hospital Recommended Level of Care: Assisted Living Facility Prior Approval Number:    Date Approved/Denied:   PASRR Number:    Discharge Plan: Other (Comment) (ALF)    Current Diagnoses: Patient Active Problem List   Diagnosis Date Noted   GI bleed 01/07/2022   Acute blood loss anemia 05/02/2021   Symptomatic anemia    DOE (dyspnea on exertion) 11/18/2017   COPD GOLD II  11/18/2017   Diverticulosis of intestine with bleeding 09/05/2017   Influenza A 08/08/2017   Acute respiratory failure with hypoxia ([place]) 08/07/2017   Depression 08/07/2017   Hyperlipemia 08/07/2017   Macular degeneration 08/07/2017   Glaucoma 08/07/2017   Weakness 08/07/2017   Dehydration 08/07/2017   Lower GI bleeding 10/14/2016   Diverticulosis of colon 10/14/2016   Anxiety 10/14/2016   Lower GI bleed 10/14/2016   Acute GI bleeding    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   Dermatochalasis of both upper eyelids 08/17/2014   Osteoarthrosis, unspecified whether generalized or localized, lower leg 12/20/2011    Orientation RESPIRATION BLADDER Height & Weight     Self, Time, Situation, Place  Normal Incontinent, External catheter Weight: 161 lb 13.1 oz (73.4 kg) Height:  '5\' 4"'$  (162.6 cm)  BEHAVIORAL SYMPTOMS/MOOD NEUROLOGICAL BOWEL NUTRITION STATUS      Continent Diet (low sodium)  AMBULATORY STATUS COMMUNICATION OF NEEDS Skin   Limited Assist Verbally Normal                       Personal  Care Assistance Level of Assistance  Bathing, Feeding, Dressing Bathing Assistance: Limited assistance Feeding assistance: Independent Dressing Assistance: Limited assistance     Functional Limitations Info  Sight, Hearing Sight Info: Impaired Hearing Info: Impaired      SPECIAL CARE FACTORS FREQUENCY  PT (By licensed PT), OT (By licensed OT)     PT Frequency: Home Health PT OT Frequency: Home Health OT            Contractures Contractures Info: Not present    Additional Factors Info  Code Status, Allergies Code Status Info: DNR Allergies Info: Morphine And Related, Abaloparatide, Aspirin, Duloxetine Hcl, Ventolin (Albuterol), Cefadroxil           Current Medications (01/14/2022):    Discharge Medications:  TAKE these medications     acetaminophen 500 MG tablet Commonly known as: TYLENOL Take 1,000 mg by mouth 2 (two) times daily.    albuterol 108 (90 Base) MCG/ACT inhaler Commonly known as: VENTOLIN HFA Inhale 2 puffs into the lungs daily as needed. For seasonal allergies . Use 2 puffs 3 times daily x 4 days then back to home regimen. What changed:  when to take this reasons to take this additional instructions    alendronate 70 MG tablet Commonly known as: FOSAMAX Take 70 mg by mouth every Sunday. Take with a full glass of water on an empty stomach.    Align 4 MG Caps Take 4 mg  by mouth at bedtime.    Anoro Ellipta 62.5-25 MCG/ACT Aepb Generic drug: umeclidinium-vilanterol Inhale 1 puff into the lungs every morning.    docusate sodium 100 MG capsule Commonly known as: COLACE Take 200 mg by mouth at bedtime.    dorzolamide-timolol 22.3-6.8 MG/ML ophthalmic solution Commonly known as: COSOPT Place 1 drop into both eyes 2 (two) times daily.    ferrous gluconate 240 (27 FE) MG tablet Commonly known as: FERGON Take 240 mg by mouth every morning. Take without food    HYDROcodone-acetaminophen 5-325 MG tablet Commonly known as:  NORCO/VICODIN Take 1 tablet by mouth every 6 (six) hours as needed (pain).    latanoprost 0.005 % ophthalmic solution Commonly known as: XALATAN Place 1 drop into both eyes at bedtime.    loratadine 10 MG tablet Commonly known as: CLARITIN Take 1 tablet (10 mg total) by mouth daily. What changed:  when to take this reasons to take this    Multiple Vitamin-Folic Acid Tabs Take 1 tablet by mouth every morning. Multivitamin with 834 mcg folic acid    OXYGEN 2lpm with sleep only  [employer]    pantoprazole 40 MG tablet Commonly known as: PROTONIX Take 40 mg by mouth 2 (two) times daily.    polyethylene glycol 17 g packet Commonly known as: MIRALAX / GLYCOLAX Take 8.5 g by mouth daily as needed (constipation).    PreserVision AREDS 2 Caps Take 1 capsule by mouth 2 (two) times daily.    PreviDent 5000 Booster Plus 1.1 % Pste Generic drug: Sodium Fluoride Place 1 Application onto teeth See admin instructions. Brush on teeth with a toothbrush after evening mouth care. Spit out excess and do not rinse.    propranolol 20 MG tablet Commonly known as: INDERAL Take 20 mg by mouth See admin instructions. Take one tablet (20 mg) by mouth daily at bedtime, may also take one tablet (20 mg) daily as needed for pulse over 120    Rhopressa 0.02 % Soln Generic drug: Netarsudil Dimesylate Place 1 drop into both eyes at bedtime.    rOPINIRole 1 MG tablet Commonly known as: REQUIP Take 1 mg by mouth at bedtime as needed (restless legs/tremors).    sertraline 100 MG tablet Commonly known as: ZOLOFT Take 1 tablet (100 mg total) by mouth daily. What changed: when to take this    simvastatin 20 MG tablet Commonly known as: ZOCOR Take 20 mg by mouth every evening.    vitamin C 1000 MG tablet Take 1,000 mg by mouth every morning.    Vitamin D3 50 MCG (2000 UT) Tabs Take 2,000 Units by mouth every morning.    zinc oxide 20 % ointment Apply 1 Application topically See admin instructions.  Apply topically to buttocks after every incontinent episode and as needed for redness    Relevant Imaging Results:  Relevant Lab Results:   Additional Information SS# [id number] [phone]  [facility] [place], [place]

## 2022-01-14 NOTE — TOC Progression Note (Addendum)
Transition of Care [facility]) - Progression Note    Patient Details  Name: [name] MRN: [id number] Date of Birth: Dec 12, 1940  Transition [facility]) CM/SW [place], LCSW Phone Number: 01/14/2022, 12:33 PM  Clinical Narrative:    CSW sent DC summary and FL2 to [facility]. They confirmed they use their own home health therapy provider. CSW spoke with patient and she requested CSW contact her spouse to confirm transportation option as [facility] unable to pick patient up. Confirmed with Amber no COVID test needed.   CSW spoke with spouse and he requested PTAR for safety.    Expected Discharge Plan: Assisted Living Barriers to Discharge: Barriers Resolved  Expected Discharge Plan and Services Expected Discharge Plan: Assisted Living In-house Referral: Clinical Social Work Discharge Planning Services: CM Consult   Living arrangements for the past 2 months: Apartment Expected Discharge Date: 01/14/22                         HH Arranged: PT, OT HH Agency:  ([facility] [place])         Social Determinants of Health (SDOH) Interventions    Readmission Risk Interventions    05/10/2021   11:32 AM  Readmission Risk Prevention Plan  Transportation Screening Complete  PCP or Specialist Appt within 3-5 Days Complete  HRI or Home Care Consult Complete  Social Work Consult for [place] Planning/Counseling Patient refused  Palliative Care Screening Not Applicable  Medication Review Press photographer) Complete

## 2022-01-14 NOTE — TOC Transition Note (Signed)
Transition of Care [facility]) - CM/SW Discharge Note   Patient Details  Name: [name] MRN: [id number] Date of Birth: [birth year]  Transition of Care [facility]) CM/SW Contact:  Benard Halsted, LCSW Phone Number: 01/14/2022, 12:49 PM   Clinical Narrative:    Patient will DC to: [place] [facility] Anticipated DC date: 01/14/22 Family notified: Spouse Transport by: Corey Harold   Per MD patient ready for DC to [facility]. RN to call report prior to discharge [phone]). RN, patient, patient's family, and facility notified of DC. Discharge Summary and FL2 sent to facility. DC packet on chart. Ambulance transport requested for patient.   CSW will sign off for now as social work intervention is no longer needed. Please consult [place] again if new needs arise.     Final next level of care: Assisted Living Barriers to Discharge: Barriers Resolved   Patient Goals and CMS Choice Patient states their goals for this hospitalization and ongoing recovery are:: return to [facility] and continue therapy services she had there CMS Medicare.gov Compare Post Acute Care list provided to:: Patient Choice offered to / list presented to : Patient  Discharge Placement                Patient to be transferred to facility by: [place] Name of family member notified: Spouse Patient and family notified of of transfer: 01/14/22  Discharge Plan and Services In-house Referral: Clinical Social Work Discharge Planning Services: CM Consult                      HH Arranged: PT, OT [place] Agency:  ([place])        Social Determinants of Health (SDOH) Interventions     Readmission Risk Interventions    05/10/2021   11:32 AM  Readmission Risk Prevention Plan  Transportation Screening Complete  PCP or Specialist Appt within 3-5 Days Complete  HRI or Home Care Consult Complete  Social Work Consult for [place] Planning/Counseling Patient refused  Palliative Care Screening Not Applicable   Medication Review Press photographer) Complete

## 2022-01-14 NOTE — Care Management Important Message (Signed)
Important Message  Patient Details  Name: [name] MRN: [id number] Date of Birth: 03-03-1941   Medicare Important Message Given:  Yes     Muneeb Veras [address] 01/14/2022, 3:51 PM

## 2022-01-14 NOTE — Discharge Summary (Addendum)
[name] FAO:[id number] DOB: 08/03/1940 DOA: 01/07/2022  PCP: [name], MD  Admit date: 01/07/2022  Discharge date: 01/14/2022  Admitted From: Home   Disposition:  ALF   Recommendations for Outpatient Follow-up:   Follow up with PCP in 1-2 weeks  PCP Please obtain BMP/CBC, 2 view CXR in 1week,  (see Discharge instructions)   PCP Please follow up on the following pending results:    Home Health: PT   Equipment/Devices: None  Consultations: GI Discharge Condition: Stable    CODE STATUS: Full    Diet Recommendation: Heart Healthy     Chief Complaint  Patient presents with   GI Problem     Brief history of present illness from the day of admission and additional interim summary     81 y.o. 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 Patient returns to ED with close to 1 week of dark stools and intermittent brbpr. She was found to have anemia and admitted to the hospital.                                                                 Hospital Course   Acute GI blood loss upper versus lower.  Cause unclear.  She was Hemoccult positive with some episodes of dark stool but some bright red blood per rectum as well.  She is s/p notes of packed RBC transfusion last transfusion morning of 01/13/2022, was on on PPI, fresh bleeding seems to have stopped GI has seen the patient underwent EGD on 01/11/2022 which was nonacute.  H&H has remained stable no further fresh bleeding, will be discharged with outpatient follow-up with PCP and [facility], past H/O diverticular bleed.   COPD.  On  nocturnal oxygen.  Stable no exacerbation, continue supportive care   History of pulmonary nodules.  Outpatient PCP directed age-appropriate work-up.   Hypertension.  For now blood pressure is stable on home medications.   Anxiety and depression.  Continue home medications no acute issues   Dyslipidemia.  On statin.   Discharge diagnosis     Principal Problem:   GI bleed Active Problems:   Anxiety   Depression   COPD GOLD II    Symptomatic anemia   Acute blood loss anemia    Discharge instructions    Discharge Instructions     Diet - low sodium heart healthy   Complete by: As directed    Discharge instructions   Complete by: As directed    Follow with Primary MD [name], MD in 7 days   Get CBC, CMP, 2 view Chest X ray -  checked next visit within 1 week by Primary MD   Activity: As tolerated with Full fall precautions use walker/cane & assistance as needed  Disposition ALF  Diet: Heart Healthy    Special Instructions: If you have smoked or chewed Tobacco  in the last 2 yrs please stop smoking, stop any regular Alcohol  and or any Recreational drug use.  On your next visit with your primary care physician please Get Medicines reviewed and adjusted.  Please request your Prim.MD to go over all Hospital Tests and Procedure/Radiological results at the follow up, please get all Hospital records sent to your Prim MD by signing hospital release before you go home.  If you experience worsening of your admission symptoms, develop shortness of breath, life threatening emergency, suicidal or homicidal thoughts you must seek medical attention immediately by calling 911 or calling your MD immediately  if symptoms less severe.  You Must read complete instructions/literature along with all the possible adverse reactions/side effects for all the Medicines you take and that have been prescribed to you. Take any new Medicines after you have completely understood and accpet all  the possible adverse reactions/side effects.   Increase activity slowly   Complete by: As directed        Discharge Medications   Allergies as of 01/14/2022       Reactions   Morphine And Related Itching   Abaloparatide Other (See Comments)   Unknown reaction   Aspirin Other (See Comments)   Unknown reaction   Duloxetine Hcl Other (See Comments)   Unknown reaction   Ventolin [albuterol] Other (See Comments)   rapid heart beat   Cefadroxil Hives   Patient can take amoxicillin and cipro        Medication List     TAKE these medications    acetaminophen 500 MG tablet Commonly known as: TYLENOL Take 1,000 mg by mouth 2 (two) times daily.   albuterol 108 (90 Base) MCG/ACT inhaler Commonly known as: VENTOLIN HFA Inhale 2 puffs into the lungs daily as needed. For seasonal allergies . Use 2 puffs 3 times daily x 4 days then back to home regimen. What changed:  when to take this reasons to take this additional instructions   alendronate 70 MG tablet Commonly known as: FOSAMAX Take 70 mg by mouth every Sunday. Take with a full glass of water on an empty stomach.   Align 4 MG Caps Take 4 mg by mouth at bedtime.   Anoro Ellipta 62.5-25 MCG/ACT Aepb Generic drug: umeclidinium-vilanterol Inhale 1 puff into the lungs every morning.   docusate sodium 100 MG capsule Commonly known as: COLACE Take 200 mg by mouth at bedtime.   dorzolamide-timolol 22.3-6.8 MG/ML ophthalmic solution Commonly known as: COSOPT Place 1 drop into both eyes 2 (two) times daily.   ferrous gluconate 240 (27 FE) MG tablet Commonly known as: FERGON Take 240 mg by mouth every morning. Take without food   HYDROcodone-acetaminophen 5-325 MG tablet Commonly known as: NORCO/VICODIN Take 1 tablet by mouth every 6 (six) hours as needed (pain).   latanoprost 0.005 % ophthalmic solution Commonly known as: XALATAN Place 1 drop into both eyes at bedtime.   loratadine 10 MG tablet Commonly known  as: CLARITIN Take 1 tablet (10 mg total) by mouth daily. What changed:  when to take this reasons to take this   Multiple Vitamin-Folic Acid Tabs Take 1 tablet by mouth every morning. Multivitamin with 081 mcg folic acid   OXYGEN 2lpm with sleep  only  [employer]   pantoprazole 40 MG tablet Commonly known as: PROTONIX Take 40 mg by mouth 2 (two) times daily.   polyethylene glycol 17 g packet Commonly known as: MIRALAX / GLYCOLAX Take 8.5 g by mouth daily as needed (constipation).   PreserVision AREDS 2 Caps Take 1 capsule by mouth 2 (two) times daily.   PreviDent 5000 Booster Plus 1.1 % Pste Generic drug: Sodium Fluoride Place 1 Application onto teeth See admin instructions. Brush on teeth with a toothbrush after evening mouth care. Spit out excess and do not rinse.   propranolol 20 MG tablet Commonly known as: INDERAL Take 20 mg by mouth See admin instructions. Take one tablet (20 mg) by mouth daily at bedtime, may also take one tablet (20 mg) daily as needed for pulse over 120   Rhopressa 0.02 % Soln Generic drug: Netarsudil Dimesylate Place 1 drop into both eyes at bedtime.   rOPINIRole 1 MG tablet Commonly known as: REQUIP Take 1 mg by mouth at bedtime as needed (restless legs/tremors).   sertraline 100 MG tablet Commonly known as: ZOLOFT Take 1 tablet (100 mg total) by mouth daily. What changed: when to take this   simvastatin 20 MG tablet Commonly known as: ZOCOR Take 20 mg by mouth every evening.   vitamin C 1000 MG tablet Take 1,000 mg by mouth every morning.   Vitamin D3 50 MCG (2000 UT) Tabs Take 2,000 Units by mouth every morning.   zinc oxide 20 % ointment Apply 1 Application topically See admin instructions. Apply topically to buttocks after every incontinent episode and as needed for redness         Follow-up Information     [name], MD. Schedule an appointment as soon as possible for a visit in 1 week(s).   Specialty: Internal  Medicine Contact information: [address] [place] [address] [phone]                 Major procedures and Radiology Reports - PLEASE review detailed and final reports thoroughly  -     Today   Subjective    Jazzlene Huot today has no headache,no chest abdominal pain,no new weakness tingling or numbness, feels much better wants to go home today.     Objective   Blood pressure (!) 120/53, pulse 70, temperature 98.4 F (36.9 C), temperature source Oral, resp. rate 16, height '5\' 4"'$  (1.626 m), weight 73.4 kg, SpO2 95 %.   Intake/Output Summary (Last 24 hours) at 01/14/2022 1046 Last data filed at 01/13/2022 2123 Gross per 24 hour  Intake 686.67 ml  Output 2550 ml  Net -1863.33 ml    Exam  Awake Alert, No new F.N deficits,    [place].AT,PERRAL Supple Neck,   Symmetrical Chest wall movement, Good air movement bilaterally, CTAB RRR,No Gallops,   +ve B.Sounds, Abd Soft, Non tender,  No Cyanosis, Clubbing or edema    Data Review   Recent Labs  Lab 01/07/22 1950 01/07/22 2334 01/09/22 0211 01/10/22 0047 01/10/22 1544 01/11/22 0832 01/11/22 1935 01/11/22 2146 01/12/22 0653 01/13/22 0035 01/14/22 0309  WBC 8.4   < > 7.9   < > 9.3 5.8  --  7.5 5.7 6.7 6.6  HGB 9.6*   < > 8.2*   < > 7.7* 6.9* 7.8* 7.3* 7.8* 7.4* 8.9*  HCT 29.5*   < > 25.0*   < > 24.2* 21.0* 24.1* 21.7* 23.5* 22.3* 27.9*  PLT 156   < > 141*   < >  169 145*  --  138* 127* 138* 151  MCV 98.0   < > 98.0   < > 98.8 97.7  --  94.3 94.8 92.5 95.2  MCH 31.9   < > 32.2   < > 31.4 32.1  --  31.7 31.5 30.7 30.4  MCHC 32.5   < > 32.8   < > 31.8 32.9  --  33.6 33.2 33.2 31.9  RDW 14.4   < > 14.9   < > 15.0 15.1  --  16.5* 16.6* 16.7* 16.3*  LYMPHSABS 1.5  --  1.4  --  2.6  --   --   --   --  1.6 1.8  MONOABS 0.7  --  0.9  --  1.1*  --   --   --   --  1.0 0.8  EOSABS 0.1  --  0.1  --  0.2  --   --   --   --  0.3 0.2  BASOSABS 0.1  --  0.0  --  0.0  --   --   --   --  0.0 0.0   < > = values in this  interval not displayed.    Recent Labs  Lab 01/07/22 1950 01/07/22 2016 01/08/22 0500 01/09/22 0211 01/10/22 1258 01/10/22 1500 01/11/22 0832 01/11/22 1005 01/11/22 2146 01/12/22 0653 01/13/22 0035 01/14/22 0309  NA 137  --  143   < >  --   --  142  --  140 142 141 141  K 3.9  --  3.8   < >  --   --  3.3*  --  3.6 3.8 3.7 3.8  CL 104  --  104   < >  --   --  114*  --  113* 116* 110 110  CO2 23  --  25   < >  --   --  22  --  20* 20* 23 26  GLUCOSE 159*  --  117*   < >  --   --  117*  --  149* 116* 123* 125*  BUN 17  --  16   < >  --   --  16  --  '17 15 10 11  '$ CREATININE 0.59  --  0.63   < >  --   --  0.45  --  0.58 0.48 0.59 0.58  CALCIUM 8.6*  --  8.4*   < >  --   --  7.9*  --  7.8* 7.8* 8.5* 8.5*  AST 19  --  16  --   --   --   --   --  23  --   --   --   ALT 14  --  13  --   --   --   --   --  15  --   --   --   ALKPHOS 48  --  36*  --   --   --   --   --  32*  --   --   --   BILITOT 0.4  --  0.4  --   --   --   --   --  0.4  --   --   --   ALBUMIN 3.5  --  3.0*  --   --   --   --   --  2.6*  --   --   --   MG  --   --   --   --   --   --   --  2.0  --   --  1.9 2.1  LATICACIDVEN  --  1.2  --   --  1.2 2.5* 0.9  --   --   --   --   --   INR 1.1  --  1.2  --   --   --   --   --   --   --   --   --    < > = values in this interval not displayed.    Total Time in preparing paper work, data evaluation and todays exam - 54 minutes  Lala Lund M.D on 01/14/2022 at 10:46 AM  [facility]

## 2022-01-15 DIAGNOSIS — J949 Pleural condition, unspecified: Secondary | ICD-10-CM | POA: Diagnosis not present

## 2022-01-15 DIAGNOSIS — R2689 Other abnormalities of gait and mobility: Secondary | ICD-10-CM | POA: Diagnosis not present

## 2022-01-15 DIAGNOSIS — H3530 Unspecified macular degeneration: Secondary | ICD-10-CM | POA: Diagnosis not present

## 2022-01-15 DIAGNOSIS — M6281 Muscle weakness (generalized): Secondary | ICD-10-CM | POA: Diagnosis not present

## 2022-01-16 DIAGNOSIS — H3530 Unspecified macular degeneration: Secondary | ICD-10-CM | POA: Diagnosis not present

## 2022-01-16 DIAGNOSIS — J949 Pleural condition, unspecified: Secondary | ICD-10-CM | POA: Diagnosis not present

## 2022-01-16 DIAGNOSIS — M6281 Muscle weakness (generalized): Secondary | ICD-10-CM | POA: Diagnosis not present

## 2022-01-16 DIAGNOSIS — R2689 Other abnormalities of gait and mobility: Secondary | ICD-10-CM | POA: Diagnosis not present

## 2022-01-17 DIAGNOSIS — K5790 Diverticulosis of intestine, part unspecified, without perforation or abscess without bleeding: Secondary | ICD-10-CM | POA: Diagnosis not present

## 2022-01-17 DIAGNOSIS — K922 Gastrointestinal hemorrhage, unspecified: Secondary | ICD-10-CM | POA: Diagnosis not present

## 2022-01-17 DIAGNOSIS — D62 Acute posthemorrhagic anemia: Secondary | ICD-10-CM | POA: Diagnosis not present

## 2022-01-17 DIAGNOSIS — K219 Gastro-esophageal reflux disease without esophagitis: Secondary | ICD-10-CM | POA: Diagnosis not present

## 2022-01-17 DIAGNOSIS — R1310 Dysphagia, unspecified: Secondary | ICD-10-CM | POA: Diagnosis not present

## 2022-01-17 LAB — TYPE AND SCREEN
ABO/RH(D): O POS
Antibody Screen: NEGATIVE
Unit division: 0
Unit division: 0
Unit division: 0
Unit division: 0

## 2022-01-17 LAB — BPAM RBC
Blood Product Expiration Date: 202308122359
Blood Product Expiration Date: 202308122359
Blood Product Expiration Date: 202308192359
Blood Product Expiration Date: 202308202359
ISSUE DATE / TIME: 202307141249
ISSUE DATE / TIME: 202307150035
ISSUE DATE / TIME: 202307161025
ISSUE DATE / TIME: 202307181331
Unit Type and Rh: 5100
Unit Type and Rh: 5100
Unit Type and Rh: 5100
Unit Type and Rh: 5100

## 2022-01-18 ENCOUNTER — Encounter: Payer: Self-pay | Admitting: Orthopedic Surgery

## 2022-01-18 ENCOUNTER — Non-Acute Institutional Stay: Payer: Self-pay | Admitting: Orthopedic Surgery

## 2022-01-18 DIAGNOSIS — M6281 Muscle weakness (generalized): Secondary | ICD-10-CM | POA: Diagnosis not present

## 2022-01-18 DIAGNOSIS — F32A Depression, unspecified: Secondary | ICD-10-CM

## 2022-01-18 DIAGNOSIS — K922 Gastrointestinal hemorrhage, unspecified: Secondary | ICD-10-CM

## 2022-01-18 DIAGNOSIS — I10 Essential (primary) hypertension: Secondary | ICD-10-CM

## 2022-01-18 DIAGNOSIS — R2689 Other abnormalities of gait and mobility: Secondary | ICD-10-CM | POA: Diagnosis not present

## 2022-01-18 DIAGNOSIS — H3530 Unspecified macular degeneration: Secondary | ICD-10-CM | POA: Diagnosis not present

## 2022-01-18 DIAGNOSIS — F419 Anxiety disorder, unspecified: Secondary | ICD-10-CM

## 2022-01-18 DIAGNOSIS — J949 Pleural condition, unspecified: Secondary | ICD-10-CM | POA: Diagnosis not present

## 2022-01-18 DIAGNOSIS — D62 Acute posthemorrhagic anemia: Secondary | ICD-10-CM

## 2022-01-18 DIAGNOSIS — G2581 Restless legs syndrome: Secondary | ICD-10-CM

## 2022-01-18 DIAGNOSIS — M810 Age-related osteoporosis without current pathological fracture: Secondary | ICD-10-CM

## 2022-01-18 DIAGNOSIS — J449 Chronic obstructive pulmonary disease, unspecified: Secondary | ICD-10-CM

## 2022-01-18 DIAGNOSIS — R911 Solitary pulmonary nodule: Secondary | ICD-10-CM

## 2022-01-18 DIAGNOSIS — E785 Hyperlipidemia, unspecified: Secondary | ICD-10-CM

## 2022-01-18 DIAGNOSIS — H353130 Nonexudative age-related macular degeneration, bilateral, stage unspecified: Secondary | ICD-10-CM

## 2022-01-18 NOTE — Progress Notes (Unsigned)
Location:   [facility] Room Number: 20 Place of Service:  ALF [phone]) Provider:  Windell Moulding, NP   [name], MD  Patient Care Team: [name], MD as PCP - General (Internal Medicine)  Extended Emergency Contact Information Primary Emergency Contact: Bulluck,[name] Address: [address]          [place], [place] [address] Johnnette Litter of [place] Phone: [phone] Mobile Phone: [phone] Relation: Spouse Secondary Emergency Contact: Briena, [place]ing[place] Mobile Phone: [phone] Relation: Son Interpreter needed? No  Code Status:  Managed Care Goals of care: Advanced Directive information    01/18/2022   11:40 AM  Advanced Directives  Does Patient Have a Medical Advance Directive? Yes  Type of Advance Directive Out of facility DNR (pink MOST or yellow form)  Does patient want to make changes to medical advance directive? No - Patient declined     Chief Complaint  Patient presents with   Acute Visit    Weakness    HPI:  Pt is a 81 y.o. female seen today to establish with [facility].   She currently resides on the assisted living unit at [facility]. PMH: HTN, COPD, pulmonary nodules, OA, obesity, recurrent GI bleed, macular degeneration, constipation, and depression/anxiety.   Past surgeries: Balloon dilation 05/2021, basal cell incision 2001  GI bleed/ acute blood loss anemia- she reports multiple GI bleeds in past- last 11/2020 s/p embolization with reoccurrence 05/2021, 07/10 she presented to ED due to dark stools and BRBPR, hemoccult positive, Hgb 7.9 (01/08/2022)> 9.6 > 8.7 (01/07/2022) > was 11.7 (07/23/2021), EGD 01/11/2022, 2 units PRBC 01/13/2022, hgb 8.7 (01/17/2022), discharged to AL with iron and protonix COPD/GOLD II- quit smoking 29 years ago, 27.75 pack/year history , no recent exacerbations, uses oxygen at night, remains on Ellipta  Pulmonary nodules- 08/09/2021 CT chest identified unchanged 1.3 cm x 1.2  pretracheal lymph node HTN- BUN/creat 11/0.58 01/14/2022, remains on propranolol HLD- LDL unknown, remains on simvastatin Anxiety/depression- reports being a "burden" to family/husband, no changes in mood per nursing, remains on Zoloft, Na+ 141 01/14/2022 Osteoporosis- DEXA unknown, remains on vitamin D and fosamax RLS- stable with Requip Macular degeneration- followed by Dr. 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 Ambulates with walker, no recent falls or injuries.     Past Medical History:  Diagnosis Date   Anxiety    Arthritis    "bilateral knees, shoulders, elbows; neck, pretty widespread" (09/05/2017)   BPPV (benign paroxysmal positional vertigo)    Depression    GERD (gastroesophageal reflux disease)    Glaucoma, both eyes    Headache    "probably 2/month" (09/05/2017)   History of blood transfusion ~ 2008   "related to LGIB"   Hyperlipemia    Lower GI bleeding ~ 2008; 09/05/2017   "had to have blood transfusion"   Macular degeneration, bilateral    Osteopenia    Seasonal allergies    Skin cancer, basal cell 2001   "off my nose, left side"   Sleeping excessive    Tinnitus of both ears    Past Surgical History:  Procedure Laterality Date   BALLOON DILATION N/A 05/08/2021   Procedure: BALLOON DILATION;  Surgeon: Ronnette Juniper, MD;  Location: [place];  Service: Gastroenterology;  Laterality: N/A;   BASAL CELL CARCINOMA EXCISION  2001   "off my nose, left side"   BIOPSY  05/08/2021   Procedure: BIOPSY;  Surgeon: Ronnette Juniper, MD;  Location: [place];  Service: Gastroenterology;;  BLEPHAROPLASTY Bilateral    CATARACT EXTRACTION W/ INTRAOCULAR LENS  IMPLANT, BILATERAL Bilateral 1990's   ESOPHAGOGASTRODUODENOSCOPY (EGD) WITH PROPOFOL N/A 05/08/2021   Procedure: ESOPHAGOGASTRODUODENOSCOPY (EGD) WITH PROPOFOL;  Surgeon: Ronnette Juniper, MD;  Location: [place];  Service: Gastroenterology;  Laterality: N/A;   ESOPHAGOGASTRODUODENOSCOPY (EGD) WITH PROPOFOL N/A  01/11/2022   Procedure: ESOPHAGOGASTRODUODENOSCOPY (EGD) WITH PROPOFOL;  Surgeon: Clarene Essex, MD;  Location: [place];  Service: Gastroenterology;  Laterality: N/A;   EYE SURGERY Bilateral    "to improve vision after cataract OR"   IR ANGIOGRAM FOLLOW UP STUDY  12/19/2020   IR ANGIOGRAM SELECTIVE EACH ADDITIONAL VESSEL  12/19/2020   IR ANGIOGRAM SELECTIVE EACH ADDITIONAL VESSEL  12/19/2020   IR ANGIOGRAM VISCERAL SELECTIVE  12/19/2020   IR EMBO ART  VEN HEMORR LYMPH EXTRAV  INC GUIDE ROADMAPPING  12/19/2020   IR US GUIDE VASC ACCESS RIGHT  12/19/2020   JOINT REPLACEMENT     STAPEDES SURGERY Left    "scraped stapedes because it was sticking when it wasn't suppose to"   [place] Left ~ 2008   TOTAL KNEE ARTHROPLASTY  12/20/2011   Procedure: TOTAL KNEE ARTHROPLASTY;  Surgeon: Augustin Schooling, MD;  Location: [place];  Service: Orthopedics;  Laterality: Right;  Right Total Knee Arthroplasty   TUBAL LIGATION  1980's    Allergies  Allergen Reactions   Morphine And Related Itching   Abaloparatide Other (See Comments)    Unknown reaction   Aspirin Other (See Comments)    Unknown reaction   Duloxetine Hcl Other (See Comments)    Unknown reaction   Ventolin [Albuterol] Other (See Comments)    rapid heart beat   Cefadroxil Hives    Patient can take amoxicillin and cipro    Allergies as of 01/18/2022       Reactions   Morphine And Related Itching   Abaloparatide Other (See Comments)   Unknown reaction   Aspirin Other (See Comments)   Unknown reaction   Duloxetine Hcl Other (See Comments)   Unknown reaction   Ventolin [albuterol] Other (See Comments)   rapid heart beat   Cefadroxil Hives   Patient can take amoxicillin and cipro        Medication List        Accurate as of January 18, 2022 11:40 AM. If you have any questions, ask your nurse or doctor.          acetaminophen 500 MG tablet Commonly known as: TYLENOL Take  1,000 mg by mouth 2 (two) times daily.   albuterol 108 (90 Base) MCG/ACT inhaler Commonly known as: VENTOLIN HFA Inhale 2 puffs into the lungs every 4 (four) hours as needed for wheezing or shortness of breath. What changed: Another medication with the same name was removed. Continue taking this medication, and follow the directions you see here. Changed by: Yvonna Alanis, NP   alendronate 70 MG tablet Commonly known as: FOSAMAX Take 70 mg by mouth every Sunday. Take with a full glass of water on an empty stomach.   Align 4 MG Caps Take 4 mg by mouth at bedtime.   Anoro Ellipta 62.5-25 MCG/ACT Aepb Generic drug: umeclidinium-vilanterol Inhale 1 puff into the lungs every morning.   docusate sodium 100 MG capsule Commonly known as: COLACE Take 200 mg by mouth at bedtime.   dorzolamide-timolol 22.3-6.8 MG/ML ophthalmic solution Commonly known as: COSOPT Place 1 drop into both eyes 2 (two) times daily.  ferrous gluconate 240 (27 FE) MG tablet Commonly known as: FERGON Take 240 mg by mouth every morning. Take without food   HYDROcodone-acetaminophen 5-325 MG tablet Commonly known as: NORCO/VICODIN Take 1 tablet by mouth every 6 (six) hours as needed (pain).   latanoprost 0.005 % ophthalmic solution Commonly known as: XALATAN Place 1 drop into both eyes at bedtime.   loratadine 10 MG tablet Commonly known as: CLARITIN Take 1 tablet (10 mg total) by mouth daily.   Multiple Vitamin-Folic Acid Tabs Take 1 tablet by mouth every morning. Multivitamin with 741 mcg folic acid   OXYGEN 2lpm with sleep only  [employer]   pantoprazole 40 MG tablet Commonly known as: PROTONIX Take 40 mg by mouth 2 (two) times daily.   polyethylene glycol 17 g packet Commonly known as: MIRALAX / GLYCOLAX Take 8.5 g by mouth daily as needed (constipation).   PreserVision AREDS 2 Caps Take 1 capsule by mouth 2 (two) times daily.   PreviDent 5000 Booster Plus 1.1 % Pste Generic drug: Sodium  Fluoride Place 1 Application onto teeth See admin instructions. Brush on teeth with a toothbrush after evening mouth care. Spit out excess and do not rinse.   propranolol 20 MG tablet Commonly known as: INDERAL Take 20 mg by mouth See admin instructions. Take one tablet (20 mg) by mouth daily at bedtime, may also take one tablet (20 mg) daily as needed for pulse over 120   Rhopressa 0.02 % Soln Generic drug: Netarsudil Dimesylate Place 1 drop into both eyes at bedtime.   rOPINIRole 1 MG tablet Commonly known as: REQUIP Take 0.5 mg by mouth at bedtime as needed (restless legs/tremors).   sertraline 100 MG tablet Commonly known as: ZOLOFT Take 1 tablet (100 mg total) by mouth daily.   simvastatin 20 MG tablet Commonly known as: ZOCOR Take 20 mg by mouth every evening.   vitamin C 1000 MG tablet Take 1,000 mg by mouth every morning.   Vitamin D3 50 MCG (2000 UT) Tabs Take 2,000 Units by mouth every morning.   zinc oxide 20 % ointment Apply 1 Application topically See admin instructions. Apply topically to buttocks after every incontinent episode and as needed for redness        Review of Systems  Constitutional:  Negative for activity change, appetite change, chills, diaphoresis, fatigue and fever.  HENT:  Positive for dental problem and hearing loss. Negative for trouble swallowing.        Bilateral hearing aids, partial plate dentures  Eyes:  Negative for discharge and itching.  Respiratory:  Positive for shortness of breath. Negative for cough and wheezing.        Oxygen use  Cardiovascular:  Negative for chest pain and leg swelling.  Gastrointestinal:  Positive for blood in stool. Negative for abdominal distention, abdominal pain, constipation, diarrhea, nausea and vomiting.  Genitourinary:  Negative for dysuria, frequency and hematuria.  Musculoskeletal:  Positive for arthralgias and gait problem.  Neurological:  Positive for weakness. Negative for dizziness and  light-headedness.  Psychiatric/Behavioral:  Positive for dysphoric mood. Negative for confusion, sleep disturbance and suicidal ideas. The patient is nervous/anxious.     Immunization History  Administered Date(s) Administered   Influenza, High Dose Seasonal PF 03/14/2018   PFIZER(Purple Top)SARS-COV-2 Vaccination 07/25/2019, 08/16/2019   Pneumococcal-Unspecified 03/02/2011   Tdap 03/12/2018   Zoster Recombinat (Shingrix) 10/08/2017, 12/09/2017   Pertinent  Health Maintenance Due  Topic Date Due   DEXA SCAN  Never done   INFLUENZA VACCINE  01/29/2022      01/12/2022    7:40 AM 01/12/2022    7:14 PM 01/13/2022    7:50 AM 01/13/2022    8:00 PM 01/14/2022    8:45 AM  Fall Risk  Patient Fall Risk Level Moderate fall risk Moderate fall risk Moderate fall risk Moderate fall risk Moderate fall risk   Functional Status Survey:    Vitals:   01/18/22 1127  BP: 112/66  Pulse: 88  Resp: 18  Temp: (!) 97 F (36.1 C)  SpO2: 96%  Weight: 161 lb (73 kg)  Height: '5\' 1"'$  (1.549 m)   Body mass index is 30.42 kg/m. Physical Exam Vitals reviewed.  Constitutional:      General: She is not in acute distress. HENT:     Head: Normocephalic.     Right Ear: There is no impacted cerumen.     Left Ear: There is no impacted cerumen.     Mouth/Throat:     Mouth: Mucous membranes are moist.  Eyes:     General:        Right eye: No discharge.        Left eye: No discharge.  Cardiovascular:     Rate and Rhythm: Normal rate and regular rhythm.     Pulses: Normal pulses.     Heart sounds: Normal heart sounds.  Pulmonary:     Effort: Pulmonary effort is normal. No respiratory distress.     Breath sounds: Normal breath sounds. No wheezing.  Abdominal:     General: Bowel sounds are normal. There is no distension.     Palpations: Abdomen is soft.     Tenderness: There is no abdominal tenderness.  Musculoskeletal:     Cervical back: Neck supple.     Right lower leg: No edema.     Left lower  leg: No edema.  Skin:    General: Skin is warm and dry.     Capillary Refill: Capillary refill takes less than 2 seconds.  Neurological:     General: No focal deficit present.     Mental Status: She is alert.     Motor: Weakness present.     Gait: Gait abnormal.     Comments: walker  Psychiatric:        Mood and Affect: Mood normal.        Behavior: Behavior normal.     Labs reviewed: Recent Labs    05/03/21 0048 05/04/21 1817 05/05/21 0009 05/05/21 0802 01/11/22 1005 01/11/22 2146 01/12/22 0653 01/13/22 0035 01/14/22 0309  NA 137   < > 138   < >  --    < > 142 141 141  K 3.6   < > 3.9   < >  --    < > 3.8 3.7 3.8  CL 104   < > 99   < >  --    < > 116* 110 110  CO2 29   < > 30   < >  --    < > 20* 23 26  GLUCOSE 106*   < > 106*   < >  --    < > 116* 123* 125*  BUN <5*   < > 7*   < >  --    < > '15 10 11  '$ CREATININE 0.46   < > 0.57   < >  --    < > 0.48 0.59 0.58  CALCIUM 8.4*   < > 8.6*   < >  --    < >  7.8* 8.5* 8.5*  MG 2.1  --  2.0   < > 2.0  --   --  1.9 2.1  PHOS 3.7  --  3.2  --   --   --   --   --   --    < > = values in this interval not displayed.   Recent Labs    01/07/22 1950 01/08/22 0500 01/11/22 2146  AST '19 16 23  '$ ALT '14 13 15  '$ ALKPHOS 48 36* 32*  BILITOT 0.4 0.4 0.4  PROT 6.0* 5.0* 4.5*  ALBUMIN 3.5 3.0* 2.6*   Recent Labs    01/10/22 1544 01/11/22 0832 01/12/22 0653 01/13/22 0035 01/14/22 0309  WBC 9.3   < > 5.7 6.7 6.6  NEUTROABS 5.3  --   --  3.9 3.7  HGB 7.7*   < > 7.8* 7.4* 8.9*  HCT 24.2*   < > 23.5* 22.3* 27.9*  MCV 98.8   < > 94.8 92.5 95.2  PLT 169   < > 127* 138* 151   < > = values in this interval not displayed.   Lab Results  Component Value Date   TSH 1.88 11/18/2017   No results found for: "HGBA1C" No results found for: "CHOL", "HDL", "LDLCALC", "LDLDIRECT", "TRIG", "CHOLHDL"  Significant Diagnostic Results in last 30 days:  No results found.  Assessment/Plan 1. Gastrointestinal hemorrhage, unspecified  gastrointestinal hemorrhage type - hospitalized 07/10- 07/17 - EGD 07/14 - received 2 units PRBC - hgb 8.7 01/17/2022 - h/o recurrent GI bleed 11/2020 to 05/2021 - no sign of bleeding at this time - recommend cbc/diff in 1 month - cont Fergon and Protonix  2. Acute blood loss anemia - reports some weakness  - see above - cont fergon and Protonix  3. COPD GOLD II  - past smoking history - uses nighttime oxygen - cont Ellipta  4. Pulmonary nodule - 08/09/2021 CT chest identified unchanged 1.3 cm x 1.2 pretracheal lymph node  5. Essential hypertension - controlled - cont propranolol  6. Hyperlipidemia, unspecified hyperlipidemia type - LDL unknown - cont simvastatin - LDL future  7. Anxiety and depression - no mood changes per nursing - cont Zoloft  8. Senile osteoporosis - DEXA unknown  - cont Fosamax and vitamin D  9. Restless leg syndrome - cont Requip  10. Bilateral nonexudative age-related macular degeneration, unspecified stage - followed by Dr. Eulas Post    Family/ staff Communication: plan discussed with patient and nurse  Labs/tests ordered:   cbc/diff in 1 month

## 2022-01-21 DIAGNOSIS — J949 Pleural condition, unspecified: Secondary | ICD-10-CM | POA: Diagnosis not present

## 2022-01-21 DIAGNOSIS — M6281 Muscle weakness (generalized): Secondary | ICD-10-CM | POA: Diagnosis not present

## 2022-01-21 DIAGNOSIS — R2689 Other abnormalities of gait and mobility: Secondary | ICD-10-CM | POA: Diagnosis not present

## 2022-01-21 DIAGNOSIS — H3530 Unspecified macular degeneration: Secondary | ICD-10-CM | POA: Diagnosis not present

## 2022-01-22 DIAGNOSIS — R2689 Other abnormalities of gait and mobility: Secondary | ICD-10-CM | POA: Diagnosis not present

## 2022-01-22 DIAGNOSIS — J949 Pleural condition, unspecified: Secondary | ICD-10-CM | POA: Diagnosis not present

## 2022-01-22 DIAGNOSIS — H3530 Unspecified macular degeneration: Secondary | ICD-10-CM | POA: Diagnosis not present

## 2022-01-22 DIAGNOSIS — M6281 Muscle weakness (generalized): Secondary | ICD-10-CM | POA: Diagnosis not present

## 2022-01-23 DIAGNOSIS — M6281 Muscle weakness (generalized): Secondary | ICD-10-CM | POA: Diagnosis not present

## 2022-01-23 DIAGNOSIS — J949 Pleural condition, unspecified: Secondary | ICD-10-CM | POA: Diagnosis not present

## 2022-01-23 DIAGNOSIS — R2689 Other abnormalities of gait and mobility: Secondary | ICD-10-CM | POA: Diagnosis not present

## 2022-01-23 DIAGNOSIS — H3530 Unspecified macular degeneration: Secondary | ICD-10-CM | POA: Diagnosis not present

## 2022-01-25 DIAGNOSIS — R2689 Other abnormalities of gait and mobility: Secondary | ICD-10-CM | POA: Diagnosis not present

## 2022-01-25 DIAGNOSIS — H3530 Unspecified macular degeneration: Secondary | ICD-10-CM | POA: Diagnosis not present

## 2022-01-25 DIAGNOSIS — M6281 Muscle weakness (generalized): Secondary | ICD-10-CM | POA: Diagnosis not present

## 2022-01-25 DIAGNOSIS — J949 Pleural condition, unspecified: Secondary | ICD-10-CM | POA: Diagnosis not present

## 2022-01-28 DIAGNOSIS — R2689 Other abnormalities of gait and mobility: Secondary | ICD-10-CM | POA: Diagnosis not present

## 2022-01-28 DIAGNOSIS — J949 Pleural condition, unspecified: Secondary | ICD-10-CM | POA: Diagnosis not present

## 2022-01-28 DIAGNOSIS — M6281 Muscle weakness (generalized): Secondary | ICD-10-CM | POA: Diagnosis not present

## 2022-01-28 DIAGNOSIS — H3530 Unspecified macular degeneration: Secondary | ICD-10-CM | POA: Diagnosis not present

## 2022-01-29 DIAGNOSIS — H3530 Unspecified macular degeneration: Secondary | ICD-10-CM | POA: Diagnosis not present

## 2022-01-29 DIAGNOSIS — J949 Pleural condition, unspecified: Secondary | ICD-10-CM | POA: Diagnosis not present

## 2022-01-29 DIAGNOSIS — M6281 Muscle weakness (generalized): Secondary | ICD-10-CM | POA: Diagnosis not present

## 2022-01-30 DIAGNOSIS — J949 Pleural condition, unspecified: Secondary | ICD-10-CM | POA: Diagnosis not present

## 2022-01-30 DIAGNOSIS — H3530 Unspecified macular degeneration: Secondary | ICD-10-CM | POA: Diagnosis not present

## 2022-01-30 DIAGNOSIS — M6281 Muscle weakness (generalized): Secondary | ICD-10-CM | POA: Diagnosis not present

## 2022-02-01 ENCOUNTER — Non-Acute Institutional Stay: Payer: Medicare HMO | Admitting: Orthopedic Surgery

## 2022-02-01 ENCOUNTER — Encounter: Payer: Self-pay | Admitting: Orthopedic Surgery

## 2022-02-01 DIAGNOSIS — D62 Acute posthemorrhagic anemia: Secondary | ICD-10-CM | POA: Diagnosis not present

## 2022-02-01 DIAGNOSIS — R350 Frequency of micturition: Secondary | ICD-10-CM

## 2022-02-01 DIAGNOSIS — R911 Solitary pulmonary nodule: Secondary | ICD-10-CM

## 2022-02-01 DIAGNOSIS — E785 Hyperlipidemia, unspecified: Secondary | ICD-10-CM

## 2022-02-01 DIAGNOSIS — N390 Urinary tract infection, site not specified: Secondary | ICD-10-CM | POA: Diagnosis not present

## 2022-02-01 DIAGNOSIS — F419 Anxiety disorder, unspecified: Secondary | ICD-10-CM

## 2022-02-01 DIAGNOSIS — J949 Pleural condition, unspecified: Secondary | ICD-10-CM | POA: Diagnosis not present

## 2022-02-01 DIAGNOSIS — I10 Essential (primary) hypertension: Secondary | ICD-10-CM

## 2022-02-01 DIAGNOSIS — R69 Illness, unspecified: Secondary | ICD-10-CM | POA: Diagnosis not present

## 2022-02-01 DIAGNOSIS — F32A Depression, unspecified: Secondary | ICD-10-CM

## 2022-02-01 DIAGNOSIS — G2581 Restless legs syndrome: Secondary | ICD-10-CM

## 2022-02-01 DIAGNOSIS — H3530 Unspecified macular degeneration: Secondary | ICD-10-CM | POA: Diagnosis not present

## 2022-02-01 DIAGNOSIS — K922 Gastrointestinal hemorrhage, unspecified: Secondary | ICD-10-CM | POA: Diagnosis not present

## 2022-02-01 DIAGNOSIS — M810 Age-related osteoporosis without current pathological fracture: Secondary | ICD-10-CM | POA: Diagnosis not present

## 2022-02-01 DIAGNOSIS — J449 Chronic obstructive pulmonary disease, unspecified: Secondary | ICD-10-CM | POA: Diagnosis not present

## 2022-02-01 DIAGNOSIS — M6281 Muscle weakness (generalized): Secondary | ICD-10-CM | POA: Diagnosis not present

## 2022-02-01 NOTE — Progress Notes (Addendum)
Location:   [place] Room Number: 20 Place of Service:  [place] [phone]) Provider:  Windell Moulding, NP  [name], MD  Patient Care Team: [name], MD as PCP - General (Internal Medicine)  Extended Emergency Contact Information Primary Emergency Contact: Heintzelman,[name] Address: [address]          [place], [place] [address] Johnnette Litter of [place] Phone: [phone] Mobile Phone: [phone] Relation: Spouse Secondary Emergency Contact: Ariely, [place] Mobile Phone: [phone] Relation: Son Interpreter needed? No  Code Status:   Goals of care: Advanced Directive information    02/01/2022   11:10 AM  Advanced Directives  Does Patient Have a Medical Advance Directive? No  Does patient want to make changes to medical advance directive? No - Patient declined     Chief Complaint  Patient presents with   Acute Visit    Urinary frequency    HPI:  Pt is a 81 y.o. female seen today for acute visit due to urinary frequency.   She reports increased urinary frequency x 2 weeks. Denies fever, dysuria or flank pain. Describes urine as concentrated with increased odor. Admits to drinking a lot of caffeinated beverages throughout the day. She is concerned she may have a UTI today.   GI bleed/ acute blood loss anemia- she reports multiple GI bleeds in past- last 11/2020 s/p embolization with reoccurrence 05/2021, 07/10 she presented to ED due to dark stools and BRBPR, hemoccult positive, Hgb 7.9 (01/08/2022)> 9.6 > 8.7 (01/07/2022) > was 11.7 (07/23/2021), EGD 01/11/2022, 2 units PRBC 01/13/2022, hgb 8.7 (01/17/2022), discharged to [facility] with iron and Protonix, denies black tarry/bloody stools today COPD/GOLD II- quit smoking 29 years ago, 27.75 pack/year history , no recent exacerbations, uses oxygen at night, remains on Ellipta  Pulmonary nodules- 08/09/2021 CT chest identified unchanged 1.3 cm x 1.2 pretracheal lymph node HTN- BUN/creat 11/0.58 01/14/2022,  remains on propranolol HLD- LDL unknown, remains on simvastatin Anxiety/depression- reports being a "burden" to family/husband, no changes in mood per nursing, remains on Zoloft, Na+ 141 01/14/2022 Osteoporosis- DEXA unknown, remains on vitamin D and fosamax RLS- stable with Requip Macular degeneration- followed by Dr. 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    Past Medical History:  Diagnosis Date   Anxiety    Arthritis    "bilateral knees, shoulders, elbows; neck, pretty widespread" (09/05/2017)   BPPV (benign paroxysmal positional vertigo)    Depression    GERD (gastroesophageal reflux disease)    Glaucoma, both eyes    Headache    "probably 2/month" (09/05/2017)   History of blood transfusion ~ 2008   "related to LGIB"   Hyperlipemia    Lower GI bleeding ~ 2008; 09/05/2017   "had to have blood transfusion"   Macular degeneration, bilateral    Osteopenia    Seasonal allergies    Skin cancer, basal cell 2001   "off my nose, left side"   Sleeping excessive    Tinnitus of both ears    Past Surgical History:  Procedure Laterality Date   BALLOON DILATION N/A 05/08/2021   Procedure: BALLOON DILATION;  Surgeon: Ronnette Juniper, MD;  Location: [facility];  Service: Gastroenterology;  Laterality: N/A;   BASAL CELL CARCINOMA EXCISION  2001   "off my nose, left side"   BIOPSY  05/08/2021   Procedure: BIOPSY;  Surgeon: Ronnette Juniper, MD;  Location: [facility];  Service: Gastroenterology;;   BLEPHAROPLASTY Bilateral    CATARACT EXTRACTION W/ INTRAOCULAR LENS  IMPLANT, BILATERAL Bilateral 1990's  ESOPHAGOGASTRODUODENOSCOPY (EGD) WITH PROPOFOL N/A 05/08/2021   Procedure: ESOPHAGOGASTRODUODENOSCOPY (EGD) WITH PROPOFOL;  Surgeon: Ronnette Juniper, MD;  Location: [place] [facility];  Service: Gastroenterology;  Laterality: N/A;   ESOPHAGOGASTRODUODENOSCOPY (EGD) WITH PROPOFOL N/A 01/11/2022   Procedure: ESOPHAGOGASTRODUODENOSCOPY (EGD) WITH PROPOFOL;  Surgeon: Clarene Essex, MD;  Location: [facility];  Service:  Gastroenterology;  Laterality: N/A;   EYE SURGERY Bilateral    "to improve vision after cataract OR"   IR ANGIOGRAM FOLLOW UP STUDY  12/19/2020   IR ANGIOGRAM SELECTIVE EACH ADDITIONAL VESSEL  12/19/2020   IR ANGIOGRAM SELECTIVE EACH ADDITIONAL VESSEL  12/19/2020   IR ANGIOGRAM VISCERAL SELECTIVE  12/19/2020   IR EMBO ART  VEN HEMORR LYMPH EXTRAV  INC GUIDE ROADMAPPING  12/19/2020   IR US GUIDE VASC ACCESS RIGHT  12/19/2020   JOINT REPLACEMENT     STAPEDES SURGERY Left    "scraped stapedes because it was sticking when it wasn't suppose to"   [place] Left ~ 2008   TOTAL KNEE ARTHROPLASTY  12/20/2011   Procedure: TOTAL KNEE ARTHROPLASTY;  Surgeon: Augustin Schooling, MD;  Location: [facility];  Service: Orthopedics;  Laterality: Right;  Right Total Knee Arthroplasty   TUBAL LIGATION  1980's    Allergies  Allergen Reactions   Morphine And Related Itching   Abaloparatide Other (See Comments)    Unknown reaction   Aspirin Other (See Comments)    Unknown reaction   Duloxetine Hcl Other (See Comments)    Unknown reaction   Ventolin [Albuterol] Other (See Comments)    rapid heart beat   Cefadroxil Hives    Patient can take amoxicillin and cipro    Allergies as of 02/01/2022       Reactions   Morphine And Related Itching   Abaloparatide Other (See Comments)   Unknown reaction   Aspirin Other (See Comments)   Unknown reaction   Duloxetine Hcl Other (See Comments)   Unknown reaction   Ventolin [albuterol] Other (See Comments)   rapid heart beat   Cefadroxil Hives   Patient can take amoxicillin and cipro        Medication List        Accurate as of February 01, 2022 11:59 PM. If you have any questions, ask your nurse or doctor.          acetaminophen 500 MG tablet Commonly known as: TYLENOL Take 1,000 mg by mouth 2 (two) times daily.   albuterol 108 (90 Base) MCG/ACT inhaler Commonly known as: VENTOLIN HFA Inhale 2 puffs into  the lungs every 4 (four) hours as needed for wheezing or shortness of breath.   alendronate 70 MG tablet Commonly known as: FOSAMAX Take 70 mg by mouth every Sunday. Take with a full glass of water on an empty stomach.   Align 4 MG Caps Take 4 mg by mouth at bedtime.   Anoro Ellipta 62.5-25 MCG/ACT Aepb Generic drug: umeclidinium-vilanterol Inhale 1 puff into the lungs every morning.   docusate sodium 100 MG capsule Commonly known as: COLACE Take 200 mg by mouth at bedtime.   dorzolamide-timolol 22.3-6.8 MG/ML ophthalmic solution Commonly known as: COSOPT Place 1 drop into both eyes 2 (two) times daily.   ferrous gluconate 240 (27 FE) MG tablet Commonly known as: FERGON Take 240 mg by mouth every morning. Take without food   HYDROcodone-acetaminophen 5-325 MG tablet Commonly known as: NORCO/VICODIN Take 1 tablet by mouth every 6 (six) hours as needed (pain).  latanoprost 0.005 % ophthalmic solution Commonly known as: XALATAN Place 1 drop into both eyes at bedtime.   loratadine 10 MG tablet Commonly known as: CLARITIN Take 1 tablet (10 mg total) by mouth daily.   Multiple Vitamin-Folic Acid Tabs Take 1 tablet by mouth every morning. Multivitamin with 517 mcg folic acid   OXYGEN 2lpm with sleep only  [employer]   pantoprazole 40 MG tablet Commonly known as: PROTONIX Take 40 mg by mouth 2 (two) times daily.   polyethylene glycol 17 g packet Commonly known as: MIRALAX / GLYCOLAX Take 8.5 g by mouth daily as needed (constipation).   PreserVision AREDS 2 Caps Take 1 capsule by mouth 2 (two) times daily.   PreviDent 5000 Booster Plus 1.1 % Pste Generic drug: Sodium Fluoride Place 1 Application onto teeth See admin instructions. Brush on teeth with a toothbrush after evening mouth care. Spit out excess and do not rinse.   propranolol 20 MG tablet Commonly known as: INDERAL Take 20 mg by mouth See admin instructions. Take one tablet (20 mg) by mouth daily at bedtime,  may also take one tablet (20 mg) daily as needed for pulse over 120   Rhopressa 0.02 % Soln Generic drug: Netarsudil Dimesylate Place 1 drop into both eyes at bedtime.   rOPINIRole 1 MG tablet Commonly known as: REQUIP Take 0.5 mg by mouth at bedtime as needed (restless legs/tremors).   sertraline 100 MG tablet Commonly known as: ZOLOFT Take 1 tablet (100 mg total) by mouth daily.   simvastatin 20 MG tablet Commonly known as: ZOCOR Take 20 mg by mouth every evening.   vitamin C 1000 MG tablet Take 1,000 mg by mouth every morning.   Vitamin D3 50 MCG (2000 UT) Tabs Take 2,000 Units by mouth every morning.   zinc oxide 20 % ointment Apply 1 Application topically See admin instructions. Apply topically to buttocks after every incontinent episode and as needed for redness        Review of Systems  Constitutional:  Negative for activity change, appetite change, chills, fatigue and fever.  HENT:  Negative for trouble swallowing.   Eyes:  Negative for visual disturbance.  Respiratory:  Negative for cough, shortness of breath and wheezing.   Cardiovascular:  Negative for chest pain and leg swelling.  Gastrointestinal:  Negative for abdominal distention, abdominal pain, blood in stool, constipation, diarrhea, nausea and vomiting.  Genitourinary:  Positive for frequency. Negative for dysuria and hematuria.  Musculoskeletal:  Positive for gait problem.  Neurological:  Positive for weakness. Negative for dizziness and headaches.  Psychiatric/Behavioral:  Positive for dysphoric mood. Negative for confusion and sleep disturbance. The patient is not nervous/anxious.     Immunization History  Administered Date(s) Administered   Influenza, High Dose Seasonal PF 03/14/2018   PFIZER(Purple Top)SARS-COV-2 Vaccination 07/25/2019, 08/16/2019   Pneumococcal-Unspecified 03/02/2011   Tdap 03/12/2018   Zoster Recombinat (Shingrix) 10/08/2017, 12/09/2017   Pertinent  Health Maintenance Due   Topic Date Due   DEXA SCAN  Never done   INFLUENZA VACCINE  01/29/2022      01/12/2022    7:40 AM 01/12/2022    7:14 PM 01/13/2022    7:50 AM 01/13/2022    8:00 PM 01/14/2022    8:45 AM  Fall Risk  Patient Fall Risk Level Moderate fall risk Moderate fall risk Moderate fall risk Moderate fall risk Moderate fall risk   Functional Status Survey:    Vitals:   02/01/22 1058  BP: 103/67  Pulse: 62  Resp: 18  Temp: (!) 96.9 F (36.1 C)  SpO2: 95%  Weight: 168 lb 6.4 oz (76.4 kg)  Height: '5\' 1"'$  (1.549 m)   Body mass index is 31.82 kg/m. Physical Exam Vitals reviewed.  Constitutional:      General: She is not in acute distress. HENT:     Head: Normocephalic.  Eyes:     General:        Right eye: No discharge.        Left eye: No discharge.  Cardiovascular:     Rate and Rhythm: Normal rate and regular rhythm.     Pulses: Normal pulses.     Heart sounds: Normal heart sounds.  Pulmonary:     Effort: Pulmonary effort is normal. No respiratory distress.     Breath sounds: Normal breath sounds. No wheezing.  Abdominal:     General: Bowel sounds are normal. There is no distension.     Palpations: Abdomen is soft.     Tenderness: There is no abdominal tenderness.  Musculoskeletal:     Cervical back: Neck supple.     Right lower leg: No edema.     Left lower leg: No edema.  Skin:    General: Skin is warm and dry.     Capillary Refill: Capillary refill takes less than 2 seconds.  Neurological:     General: No focal deficit present.     Mental Status: She is alert and oriented to person, place, and time.     Motor: Weakness present.     Gait: Gait abnormal.  Psychiatric:        Mood and Affect: Mood normal.        Behavior: Behavior normal.     Labs reviewed: Recent Labs    05/03/21 0048 05/04/21 1817 05/05/21 0009 05/05/21 0802 01/11/22 1005 01/11/22 2146 01/12/22 0653 01/13/22 0035 01/14/22 0309  NA 137   < > 138   < >  --    < > 142 141 141  K 3.6   < >  3.9   < >  --    < > 3.8 3.7 3.8  CL 104   < > 99   < >  --    < > 116* 110 110  CO2 29   < > 30   < >  --    < > 20* 23 26  GLUCOSE 106*   < > 106*   < >  --    < > 116* 123* 125*  BUN <5*   < > 7*   < >  --    < > '15 10 11  '$ CREATININE 0.46   < > 0.57   < >  --    < > 0.48 0.59 0.58  CALCIUM 8.4*   < > 8.6*   < >  --    < > 7.8* 8.5* 8.5*  MG 2.1  --  2.0   < > 2.0  --   --  1.9 2.1  PHOS 3.7  --  3.2  --   --   --   --   --   --    < > = values in this interval not displayed.   Recent Labs    01/07/22 1950 01/08/22 0500 01/11/22 2146  AST '19 16 23  '$ ALT '14 13 15  '$ ALKPHOS 48 36* 32*  BILITOT 0.4 0.4 0.4  PROT 6.0* 5.0* 4.5*  ALBUMIN 3.5 3.0* 2.6*  Recent Labs    01/10/22 1544 01/11/22 0832 01/12/22 0653 01/13/22 0035 01/14/22 0309  WBC 9.3   < > 5.7 6.7 6.6  NEUTROABS 5.3  --   --  3.9 3.7  HGB 7.7*   < > 7.8* 7.4* 8.9*  HCT 24.2*   < > 23.5* 22.3* 27.9*  MCV 98.8   < > 94.8 92.5 95.2  PLT 169   < > 127* 138* 151   < > = values in this interval not displayed.   Lab Results  Component Value Date   TSH 1.88 11/18/2017   No results found for: "HGBA1C" No results found for: "CHOL", "HDL", "LDLCALC", "LDLDIRECT", "TRIG", "CHOLHDL"  Significant Diagnostic Results in last 30 days:  No results found.  Assessment/Plan 1. Urinary frequency - onset x 2 weeks, urine concentrated with odor x 2 days - UA/culture to r/o UTI - advised to limit drinks with caffeine, drink more water  2. Gastrointestinal hemorrhage, unspecified gastrointestinal hemorrhage type - hospitalized 07/10- 07/17 - EGD 07/14 - received 2 units PRBC - hgb 8.7 01/17/2022 - h/o recurrent GI bleed 11/2020 to 05/2021 - no sign of bleeding at this time - cont Fergon and Protonix - cbc/diff scheduled 02/18/2022  3. Acute blood loss anemia - see above  4. COPD GOLD II  - cont oxygen qhs and Ellipta  5. Pulmonary nodule - 08/09/2021 CT chest identified unchanged 1.3 cm x 1.2 pretracheal lymph  node  6. Essential hypertension - controlled - cont propranolol  7. Hyperlipidemia, unspecified hyperlipidemia type - cont statin  8. Anxiety and depression - cont Zoloft  9. Senile osteoporosis - cont Fosamax and vitamin D  10. Restless leg syndrome - cont Requip   Family/ staff Communication: plan discussed with patient and nurse  Labs/tests ordered:  UA/culture

## 2022-02-04 DIAGNOSIS — H3530 Unspecified macular degeneration: Secondary | ICD-10-CM | POA: Diagnosis not present

## 2022-02-04 DIAGNOSIS — J949 Pleural condition, unspecified: Secondary | ICD-10-CM | POA: Diagnosis not present

## 2022-02-04 DIAGNOSIS — M6281 Muscle weakness (generalized): Secondary | ICD-10-CM | POA: Diagnosis not present

## 2022-02-05 ENCOUNTER — Non-Acute Institutional Stay: Payer: Medicare HMO | Admitting: Nurse Practitioner

## 2022-02-05 ENCOUNTER — Encounter: Payer: Self-pay | Admitting: Nurse Practitioner

## 2022-02-05 DIAGNOSIS — J449 Chronic obstructive pulmonary disease, unspecified: Secondary | ICD-10-CM | POA: Diagnosis not present

## 2022-02-05 DIAGNOSIS — E785 Hyperlipidemia, unspecified: Secondary | ICD-10-CM

## 2022-02-05 DIAGNOSIS — K219 Gastro-esophageal reflux disease without esophagitis: Secondary | ICD-10-CM

## 2022-02-05 DIAGNOSIS — G2581 Restless legs syndrome: Secondary | ICD-10-CM | POA: Diagnosis not present

## 2022-02-05 DIAGNOSIS — J949 Pleural condition, unspecified: Secondary | ICD-10-CM | POA: Diagnosis not present

## 2022-02-05 DIAGNOSIS — M810 Age-related osteoporosis without current pathological fracture: Secondary | ICD-10-CM | POA: Diagnosis not present

## 2022-02-05 DIAGNOSIS — D509 Iron deficiency anemia, unspecified: Secondary | ICD-10-CM

## 2022-02-05 DIAGNOSIS — R911 Solitary pulmonary nodule: Secondary | ICD-10-CM

## 2022-02-05 DIAGNOSIS — M150 Primary generalized (osteo)arthritis: Secondary | ICD-10-CM

## 2022-02-05 DIAGNOSIS — N390 Urinary tract infection, site not specified: Secondary | ICD-10-CM | POA: Insufficient documentation

## 2022-02-05 DIAGNOSIS — R69 Illness, unspecified: Secondary | ICD-10-CM | POA: Diagnosis not present

## 2022-02-05 DIAGNOSIS — M159 Polyosteoarthritis, unspecified: Secondary | ICD-10-CM | POA: Diagnosis not present

## 2022-02-05 DIAGNOSIS — F418 Other specified anxiety disorders: Secondary | ICD-10-CM

## 2022-02-05 DIAGNOSIS — H3530 Unspecified macular degeneration: Secondary | ICD-10-CM | POA: Diagnosis not present

## 2022-02-05 DIAGNOSIS — M6281 Muscle weakness (generalized): Secondary | ICD-10-CM | POA: Diagnosis not present

## 2022-02-05 DIAGNOSIS — K5900 Constipation, unspecified: Secondary | ICD-10-CM | POA: Diagnosis not present

## 2022-02-05 DIAGNOSIS — I10 Essential (primary) hypertension: Secondary | ICD-10-CM

## 2022-02-05 DIAGNOSIS — Z8719 Personal history of other diseases of the digestive system: Secondary | ICD-10-CM | POA: Diagnosis not present

## 2022-02-05 DIAGNOSIS — D500 Iron deficiency anemia secondary to blood loss (chronic): Secondary | ICD-10-CM | POA: Diagnosis not present

## 2022-02-05 NOTE — Assessment & Plan Note (Signed)
Her mood is stable, takes Sertraline 

## 2022-02-05 NOTE — Assessment & Plan Note (Signed)
Stable, takes Requip

## 2022-02-05 NOTE — Progress Notes (Unsigned)
Location:  [place] Room Number: AL/20/A Place of Service:  ALF (13) Provider:  Kervens Roper X, NP  Patient Care Team: [name], MD as PCP - General (Internal Medicine)  Extended Emergency Contact Information Primary Emergency Contact: Kantner,[name] Address: [address]          [place], [place] [address] Johnnette Litter of [place] Phone: [phone] Mobile Phone: [phone] Relation: Spouse Secondary Emergency Contact: Demitria, Hay Mobile Phone: [phone] Relation: Son Interpreter needed? No  Code Status:  FULL Goals of care: Advanced Directive information    02/01/2022   11:10 AM  Advanced Directives  Does Patient Have a Medical Advance Directive? No  Does patient want to make changes to medical advance directive? No - Patient declined     Chief Complaint  Patient presents with   Acute Visit    Patient is being seen for possible UTI    HPI:  Pt is a 81 y.o. female seen today for an acute visit for c/o urinary frequency, hesitance, urine culture showed E Coli>100,000c/ml. The patient denied dysuria, abd pain, nausea, vomiting, or fever.   GERD EGD 01/11/22, takes Pantoprazole.   Anemia, Hx of GI bleed, takes Iron, Hgb 8.9 01/14/22  COPD O2 at night, takes Ellipta, Claritin, prn Albuterol  Pulmonary nodule pretracheal lymph node 1.3x1.2 cm CT chest 08/09/21  HTN takes Propranolol, Bun/creat 11/0.58 01/14/22  HLD takes Zocor  Anxiety/depression, takes Sertraline  OP takes Fosamax, Vit D  OA multiple sites, right shoulder pain, takes Tylenol. Norco  RLS, takes Requip Past Medical History:  Diagnosis Date   Anxiety    Arthritis    "bilateral knees, shoulders, elbows; neck, pretty widespread" (09/05/2017)   BPPV (benign paroxysmal positional vertigo)    Depression    GERD (gastroesophageal reflux disease)    Glaucoma, both eyes    Headache    "probably 2/month" (09/05/2017)   History of blood transfusion ~ 2008   "related to LGIB"    Hyperlipemia    Lower GI bleeding ~ 2008; 09/05/2017   "had to have blood transfusion"   Macular degeneration, bilateral    Osteopenia    Seasonal allergies    Skin cancer, basal cell 2001   "off my nose, left side"   Sleeping excessive    Tinnitus of both ears    Past Surgical History:  Procedure Laterality Date   BALLOON DILATION N/A 05/08/2021   Procedure: BALLOON DILATION;  Surgeon: Ronnette Juniper, MD;  Location: [place];  Service: Gastroenterology;  Laterality: N/A;   BASAL CELL CARCINOMA EXCISION  2001   "off my nose, left side"   BIOPSY  05/08/2021   Procedure: BIOPSY;  Surgeon: Ronnette Juniper, MD;  Location: [facility];  Service: Gastroenterology;;   BLEPHAROPLASTY Bilateral    CATARACT EXTRACTION W/ INTRAOCULAR LENS  IMPLANT, BILATERAL Bilateral 1990's   ESOPHAGOGASTRODUODENOSCOPY (EGD) WITH PROPOFOL N/A 05/08/2021   Procedure: ESOPHAGOGASTRODUODENOSCOPY (EGD) WITH PROPOFOL;  Surgeon: Ronnette Juniper, MD;  Location: [place];  Service: Gastroenterology;  Laterality: N/A;   ESOPHAGOGASTRODUODENOSCOPY (EGD) WITH PROPOFOL N/A 01/11/2022   Procedure: ESOPHAGOGASTRODUODENOSCOPY (EGD) WITH PROPOFOL;  Surgeon: Clarene Essex, MD;  Location: [place];  Service: Gastroenterology;  Laterality: N/A;   EYE SURGERY Bilateral    "to improve vision after cataract OR"   IR ANGIOGRAM FOLLOW UP STUDY  12/19/2020   IR ANGIOGRAM SELECTIVE EACH ADDITIONAL VESSEL  12/19/2020   IR ANGIOGRAM SELECTIVE EACH ADDITIONAL VESSEL  12/19/2020   IR ANGIOGRAM VISCERAL SELECTIVE  12/19/2020  IR EMBO ART  VEN HEMORR LYMPH EXTRAV  INC GUIDE ROADMAPPING  12/19/2020   IR US GUIDE VASC ACCESS RIGHT  12/19/2020   JOINT REPLACEMENT     STAPEDES SURGERY Left    "scraped stapedes because it was sticking when it wasn't suppose to"   [place] Left ~ 2008   TOTAL KNEE ARTHROPLASTY  12/20/2011   Procedure: TOTAL KNEE ARTHROPLASTY;  Surgeon: Augustin Schooling, MD;   Location: [place];  Service: Orthopedics;  Laterality: Right;  Right Total Knee Arthroplasty   TUBAL LIGATION  1980's    Allergies  Allergen Reactions   Morphine And Related Itching   Abaloparatide Other (See Comments)    Unknown reaction   Aspirin Other (See Comments)    Unknown reaction   Duloxetine Hcl Other (See Comments)    Unknown reaction   Ventolin [Albuterol] Other (See Comments)    rapid heart beat   Cefadroxil Hives    Patient can take amoxicillin and cipro    Outpatient Encounter Medications as of 02/05/2022  Medication Sig   acetaminophen (TYLENOL) 500 MG tablet Take 1,000 mg by mouth 2 (two) times daily.   albuterol (VENTOLIN HFA) 108 (90 Base) MCG/ACT inhaler Inhale 2 puffs into the lungs every 4 (four) hours as needed for wheezing or shortness of breath.   alendronate (FOSAMAX) 70 MG tablet Take 70 mg by mouth every Sunday. Take with a full glass of water on an empty stomach.   Ascorbic Acid (VITAMIN C) 1000 MG tablet Take 1,000 mg by mouth every morning.   Cholecalciferol (VITAMIN D3) 50 MCG (2000 UT) TABS Take 2,000 Units by mouth every morning.   docusate sodium (COLACE) 100 MG capsule Take 200 mg by mouth at bedtime.   dorzolamide-timolol (COSOPT) 22.3-6.8 MG/ML ophthalmic solution Place 1 drop into both eyes 2 (two) times daily.   ferrous gluconate (FERGON) 240 (27 FE) MG tablet Take 240 mg by mouth every morning. Take without food   HYDROcodone-acetaminophen (NORCO/VICODIN) 5-325 MG tablet Take 1 tablet by mouth every 6 (six) hours as needed (pain).   latanoprost (XALATAN) 0.005 % ophthalmic solution Place 1 drop into both eyes at bedtime.   loratadine (CLARITIN) 10 MG tablet Take 1 tablet (10 mg total) by mouth daily.   Multiple Vitamin-Folic Acid TABS Take 1 tablet by mouth every morning. Multivitamin with 188 mcg folic acid   Multiple Vitamins-Minerals (PRESERVISION AREDS 2) CAPS Take 1 capsule by mouth 2 (two) times daily.   Netarsudil Dimesylate (RHOPRESSA)  0.02 % SOLN Place 1 drop into both eyes at bedtime.   OXYGEN 2lpm with sleep only  [employer]   pantoprazole (PROTONIX) 40 MG tablet Take 40 mg by mouth 2 (two) times daily.   polyethylene glycol (MIRALAX / GLYCOLAX) 17 g packet Take 8.5 g by mouth daily as needed (constipation).   Probiotic Product (ALIGN) 4 MG CAPS Take 4 mg by mouth at bedtime.   propranolol (INDERAL) 20 MG tablet Take 20 mg by mouth See admin instructions. Take one tablet (20 mg) by mouth daily at bedtime, may also take one tablet (20 mg) daily as needed for pulse over 120   rOPINIRole (REQUIP) 1 MG tablet Take 0.5 mg by mouth at bedtime as needed (restless legs/tremors).   sertraline (ZOLOFT) 100 MG tablet Take 1 tablet (100 mg total) by mouth daily.   simvastatin (ZOCOR) 20 MG tablet Take 20 mg by mouth every evening.   Sodium Fluoride (PREVIDENT  5000 BOOSTER PLUS) 1.1 % PSTE Place 1 Application onto teeth See admin instructions. Brush on teeth with a toothbrush after evening mouth care. Spit out excess and do not rinse.   umeclidinium-vilanterol (ANORO ELLIPTA) 62.5-25 MCG/ACT AEPB Inhale 1 puff into the lungs every morning.   zinc oxide 20 % ointment Apply 1 Application topically See admin instructions. Apply topically to buttocks after every incontinent episode and as needed for redness   No facility-administered encounter medications on file as of 02/05/2022.    Review of Systems  Constitutional:  Negative for activity change, fatigue and fever.  HENT:  Positive for hearing loss. Negative for congestion and trouble swallowing.   Eyes:  Negative for visual disturbance.  Respiratory:  Negative for cough, shortness of breath and wheezing.   Cardiovascular:  Positive for leg swelling.  Gastrointestinal:  Negative for abdominal distention, abdominal pain, constipation, nausea and vomiting.  Genitourinary:  Positive for frequency. Negative for difficulty urinating, dysuria, flank pain and urgency.  Musculoskeletal:  Positive  for arthralgias and gait problem.  Skin:  Negative for color change.  Neurological:  Negative for tremors and headaches.  Psychiatric/Behavioral:  Negative for behavioral problems and sleep disturbance. The patient is not nervous/anxious.     Immunization History  Administered Date(s) Administered   Influenza, High Dose Seasonal PF 03/14/2018   PFIZER(Purple Top)SARS-COV-2 Vaccination 07/25/2019, 08/16/2019   Pneumococcal-Unspecified 03/02/2011   Tdap 03/12/2018   Zoster Recombinat (Shingrix) 10/08/2017, 12/09/2017   Pertinent  Health Maintenance Due  Topic Date Due   DEXA SCAN  Never done   INFLUENZA VACCINE  01/29/2022      01/12/2022    7:40 AM 01/12/2022    7:14 PM 01/13/2022    7:50 AM 01/13/2022    8:00 PM 01/14/2022    8:45 AM  Fall Risk  Patient Fall Risk Level Moderate fall risk Moderate fall risk Moderate fall risk Moderate fall risk Moderate fall risk   Functional Status Survey:    Vitals:   02/05/22 1123  BP: 103/67  Pulse: 62  Resp: 18  Temp: (!) 96.9 F (36.1 C)  SpO2: 95%  Weight: 168 lb (76.2 kg)  Height: '5\' 1"'$  (1.549 m)   Body mass index is 31.74 kg/m. Physical Exam Nursing note reviewed.  Constitutional:      Appearance: Normal appearance.  HENT:     Head: Normocephalic and atraumatic.     Nose: Nose normal.  Eyes:     Extraocular Movements: Extraocular movements intact.     Conjunctiva/sclera: Conjunctivae normal.     Pupils: Pupils are equal, round, and reactive to light.  Cardiovascular:     Rate and Rhythm: Normal rate and regular rhythm.     Heart sounds: No murmur heard. Pulmonary:     Effort: Pulmonary effort is normal.  Abdominal:     General: Bowel sounds are normal. There is no distension.     Palpations: Abdomen is soft.     Tenderness: There is no abdominal tenderness. There is no right CVA tenderness, left CVA tenderness, guarding or rebound.  Musculoskeletal:        General: Normal range of motion.     Cervical back: Normal  range of motion and neck supple.     Right lower leg: Edema present.     Left lower leg: Edema present.     Comments: Trace edema BLE, brace right ankle.   Skin:    General: Skin is warm and dry.  Neurological:     General:  No focal deficit present.     Mental Status: She is alert and oriented to person, place, and time. Mental status is at baseline.     Gait: Gait abnormal.  Psychiatric:        Mood and Affect: Mood normal.        Behavior: Behavior normal.        Thought Content: Thought content normal.        Judgment: Judgment normal.     Labs reviewed: Recent Labs    05/03/21 0048 05/04/21 1817 05/05/21 0009 05/05/21 0802 01/11/22 1005 01/11/22 2146 01/12/22 0653 01/13/22 0035 01/14/22 0309  NA 137   < > 138   < >  --    < > 142 141 141  K 3.6   < > 3.9   < >  --    < > 3.8 3.7 3.8  CL 104   < > 99   < >  --    < > 116* 110 110  CO2 29   < > 30   < >  --    < > 20* 23 26  GLUCOSE 106*   < > 106*   < >  --    < > 116* 123* 125*  BUN <5*   < > 7*   < >  --    < > '15 10 11  '$ CREATININE 0.46   < > 0.57   < >  --    < > 0.48 0.59 0.58  CALCIUM 8.4*   < > 8.6*   < >  --    < > 7.8* 8.5* 8.5*  MG 2.1  --  2.0   < > 2.0  --   --  1.9 2.1  PHOS 3.7  --  3.2  --   --   --   --   --   --    < > = values in this interval not displayed.   Recent Labs    01/07/22 1950 01/08/22 0500 01/11/22 2146  AST '19 16 23  '$ ALT '14 13 15  '$ ALKPHOS 48 36* 32*  BILITOT 0.4 0.4 0.4  PROT 6.0* 5.0* 4.5*  ALBUMIN 3.5 3.0* 2.6*   Recent Labs    01/10/22 1544 01/11/22 0832 01/12/22 0653 01/13/22 0035 01/14/22 0309  WBC 9.3   < > 5.7 6.7 6.6  NEUTROABS 5.3  --   --  3.9 3.7  HGB 7.7*   < > 7.8* 7.4* 8.9*  HCT 24.2*   < > 23.5* 22.3* 27.9*  MCV 98.8   < > 94.8 92.5 95.2  PLT 169   < > 127* 138* 151   < > = values in this interval not displayed.   Lab Results  Component Value Date   TSH 1.88 11/18/2017   No results found for: "HGBA1C" No results found for: "CHOL", "HDL",  "LDLCALC", "LDLDIRECT", "TRIG", "CHOLHDL"  Significant Diagnostic Results in last 30 days:  No results found.  Assessment/Plan UTI (urinary tract infection) 02/01/22 Urine culture E. Coli >100,000c/ml, start Nitrofurantoin '100mg'$  bid x 7 days.   Restless leg syndrome Stable, takes Requip  Osteoarthritis, multiple sites OA multiple sites, right shoulder pain, takes Tylenol. Norco  Senile osteoporosis  takes Fosamax, Vit D  Depression with anxiety Her mood is stable,  takes Sertraline  Hyperlipemia Managed, taking Zocor.   Hypertension Blood pressure is controlled,  takes Propranolol, Bun/creat 11/0.58 01/14/22  Pulmonary nodule 1 cm or greater in diameter  Pulmonary nodule pretracheal lymph node 1.3x1.2 cm CT chest 08/09/21  COPD GOLD II  Stable, O2 at night, takes Ellipta, Claritin, prn Albuterol  Iron deficiency anemia Hx of GI bleed, takes Iron, Hgb 8.9 01/14/22  GERD (gastroesophageal reflux disease) EGD 01/11/22, takes Pantoprazole.      Family/ staff Communication: plan of care reviewed with the patient ans charge nurse  Labs/tests ordered:  none  Time spend 40 minutes

## 2022-02-05 NOTE — Assessment & Plan Note (Signed)
Managed, taking Zocor.

## 2022-02-05 NOTE — Assessment & Plan Note (Signed)
Pulmonary nodule pretracheal lymph node 1.3x1.2 cm CT chest 08/09/21

## 2022-02-05 NOTE — Assessment & Plan Note (Signed)
Stable, O2 at night, takes Ellipta, Claritin, prn Albuterol

## 2022-02-05 NOTE — Assessment & Plan Note (Signed)
02/01/22 Urine culture E. Coli >100,000c/ml, start Nitrofurantoin '100mg'$  bid x 7 days.

## 2022-02-05 NOTE — Assessment & Plan Note (Signed)
OA multiple sites, right shoulder pain, takes Tylenol. Norco

## 2022-02-05 NOTE — Assessment & Plan Note (Signed)
EGD 01/11/22, takes Pantoprazole.

## 2022-02-05 NOTE — Assessment & Plan Note (Signed)
Blood pressure is controlled,  takes Propranolol, Bun/creat 11/0.58 01/14/22

## 2022-02-05 NOTE — Assessment & Plan Note (Signed)
Hx of GI bleed, takes Iron, Hgb 8.9 01/14/22

## 2022-02-05 NOTE — Assessment & Plan Note (Signed)
takes Fosamax, Vit D

## 2022-02-06 ENCOUNTER — Encounter: Payer: Self-pay | Admitting: Nurse Practitioner

## 2022-02-06 DIAGNOSIS — J949 Pleural condition, unspecified: Secondary | ICD-10-CM | POA: Diagnosis not present

## 2022-02-06 DIAGNOSIS — M6281 Muscle weakness (generalized): Secondary | ICD-10-CM | POA: Diagnosis not present

## 2022-02-06 DIAGNOSIS — H3530 Unspecified macular degeneration: Secondary | ICD-10-CM | POA: Diagnosis not present

## 2022-02-08 DIAGNOSIS — H3530 Unspecified macular degeneration: Secondary | ICD-10-CM | POA: Diagnosis not present

## 2022-02-08 DIAGNOSIS — M6281 Muscle weakness (generalized): Secondary | ICD-10-CM | POA: Diagnosis not present

## 2022-02-08 DIAGNOSIS — J949 Pleural condition, unspecified: Secondary | ICD-10-CM | POA: Diagnosis not present

## 2022-02-11 DIAGNOSIS — H3530 Unspecified macular degeneration: Secondary | ICD-10-CM | POA: Diagnosis not present

## 2022-02-11 DIAGNOSIS — M6281 Muscle weakness (generalized): Secondary | ICD-10-CM | POA: Diagnosis not present

## 2022-02-11 DIAGNOSIS — J949 Pleural condition, unspecified: Secondary | ICD-10-CM | POA: Diagnosis not present

## 2022-02-12 DIAGNOSIS — M6281 Muscle weakness (generalized): Secondary | ICD-10-CM | POA: Diagnosis not present

## 2022-02-12 DIAGNOSIS — J949 Pleural condition, unspecified: Secondary | ICD-10-CM | POA: Diagnosis not present

## 2022-02-12 DIAGNOSIS — H3530 Unspecified macular degeneration: Secondary | ICD-10-CM | POA: Diagnosis not present

## 2022-02-13 DIAGNOSIS — J949 Pleural condition, unspecified: Secondary | ICD-10-CM | POA: Diagnosis not present

## 2022-02-13 DIAGNOSIS — H3530 Unspecified macular degeneration: Secondary | ICD-10-CM | POA: Diagnosis not present

## 2022-02-13 DIAGNOSIS — M6281 Muscle weakness (generalized): Secondary | ICD-10-CM | POA: Diagnosis not present

## 2022-02-15 DIAGNOSIS — H3530 Unspecified macular degeneration: Secondary | ICD-10-CM | POA: Diagnosis not present

## 2022-02-15 DIAGNOSIS — J949 Pleural condition, unspecified: Secondary | ICD-10-CM | POA: Diagnosis not present

## 2022-02-15 DIAGNOSIS — M6281 Muscle weakness (generalized): Secondary | ICD-10-CM | POA: Diagnosis not present

## 2022-02-18 DIAGNOSIS — H3530 Unspecified macular degeneration: Secondary | ICD-10-CM | POA: Diagnosis not present

## 2022-02-18 DIAGNOSIS — M6281 Muscle weakness (generalized): Secondary | ICD-10-CM | POA: Diagnosis not present

## 2022-02-18 DIAGNOSIS — E785 Hyperlipidemia, unspecified: Secondary | ICD-10-CM | POA: Diagnosis not present

## 2022-02-18 DIAGNOSIS — D649 Anemia, unspecified: Secondary | ICD-10-CM | POA: Diagnosis not present

## 2022-02-18 DIAGNOSIS — J949 Pleural condition, unspecified: Secondary | ICD-10-CM | POA: Diagnosis not present

## 2022-02-18 LAB — LIPID PANEL
Cholesterol: 147 (ref 0–200)
HDL: 51 (ref 35–70)
LDL Cholesterol: 72
Triglycerides: 162 — AB (ref 40–160)

## 2022-02-18 LAB — CBC: RBC: 3.62 — AB (ref 3.87–5.11)

## 2022-02-18 LAB — CBC AND DIFFERENTIAL
HCT: 34 — AB (ref 36–46)
Hemoglobin: 11.3 — AB (ref 12.0–16.0)
Neutrophils Absolute: 2358
Platelets: 158 10*3/uL (ref 150–400)
WBC: 4.5

## 2022-02-19 DIAGNOSIS — H3530 Unspecified macular degeneration: Secondary | ICD-10-CM | POA: Diagnosis not present

## 2022-02-19 DIAGNOSIS — M6281 Muscle weakness (generalized): Secondary | ICD-10-CM | POA: Diagnosis not present

## 2022-02-19 DIAGNOSIS — J949 Pleural condition, unspecified: Secondary | ICD-10-CM | POA: Diagnosis not present

## 2022-02-20 DIAGNOSIS — H3530 Unspecified macular degeneration: Secondary | ICD-10-CM | POA: Diagnosis not present

## 2022-02-20 DIAGNOSIS — J949 Pleural condition, unspecified: Secondary | ICD-10-CM | POA: Diagnosis not present

## 2022-02-20 DIAGNOSIS — M6281 Muscle weakness (generalized): Secondary | ICD-10-CM | POA: Diagnosis not present

## 2022-02-22 DIAGNOSIS — M6281 Muscle weakness (generalized): Secondary | ICD-10-CM | POA: Diagnosis not present

## 2022-02-22 DIAGNOSIS — H3530 Unspecified macular degeneration: Secondary | ICD-10-CM | POA: Diagnosis not present

## 2022-02-22 DIAGNOSIS — J949 Pleural condition, unspecified: Secondary | ICD-10-CM | POA: Diagnosis not present

## 2022-02-25 DIAGNOSIS — H401132 Primary open-angle glaucoma, bilateral, moderate stage: Secondary | ICD-10-CM | POA: Diagnosis not present

## 2022-02-25 DIAGNOSIS — H18593 Other hereditary corneal dystrophies, bilateral: Secondary | ICD-10-CM | POA: Diagnosis not present

## 2022-02-25 DIAGNOSIS — H353131 Nonexudative age-related macular degeneration, bilateral, early dry stage: Secondary | ICD-10-CM | POA: Diagnosis not present

## 2022-02-26 DIAGNOSIS — J949 Pleural condition, unspecified: Secondary | ICD-10-CM | POA: Diagnosis not present

## 2022-02-26 DIAGNOSIS — M6281 Muscle weakness (generalized): Secondary | ICD-10-CM | POA: Diagnosis not present

## 2022-02-26 DIAGNOSIS — H3530 Unspecified macular degeneration: Secondary | ICD-10-CM | POA: Diagnosis not present

## 2022-02-28 DIAGNOSIS — M6281 Muscle weakness (generalized): Secondary | ICD-10-CM | POA: Diagnosis not present

## 2022-02-28 DIAGNOSIS — H3530 Unspecified macular degeneration: Secondary | ICD-10-CM | POA: Diagnosis not present

## 2022-02-28 DIAGNOSIS — J949 Pleural condition, unspecified: Secondary | ICD-10-CM | POA: Diagnosis not present

## 2022-03-01 DIAGNOSIS — J949 Pleural condition, unspecified: Secondary | ICD-10-CM | POA: Diagnosis not present

## 2022-03-01 DIAGNOSIS — M6281 Muscle weakness (generalized): Secondary | ICD-10-CM | POA: Diagnosis not present

## 2022-03-01 DIAGNOSIS — H3530 Unspecified macular degeneration: Secondary | ICD-10-CM | POA: Diagnosis not present

## 2022-03-04 DIAGNOSIS — H3530 Unspecified macular degeneration: Secondary | ICD-10-CM | POA: Diagnosis not present

## 2022-03-04 DIAGNOSIS — J949 Pleural condition, unspecified: Secondary | ICD-10-CM | POA: Diagnosis not present

## 2022-03-04 DIAGNOSIS — M6281 Muscle weakness (generalized): Secondary | ICD-10-CM | POA: Diagnosis not present

## 2022-03-05 DIAGNOSIS — J949 Pleural condition, unspecified: Secondary | ICD-10-CM | POA: Diagnosis not present

## 2022-03-05 DIAGNOSIS — M6281 Muscle weakness (generalized): Secondary | ICD-10-CM | POA: Diagnosis not present

## 2022-03-05 DIAGNOSIS — H3530 Unspecified macular degeneration: Secondary | ICD-10-CM | POA: Diagnosis not present

## 2022-03-07 DIAGNOSIS — J949 Pleural condition, unspecified: Secondary | ICD-10-CM | POA: Diagnosis not present

## 2022-03-07 DIAGNOSIS — M6281 Muscle weakness (generalized): Secondary | ICD-10-CM | POA: Diagnosis not present

## 2022-03-07 DIAGNOSIS — H3530 Unspecified macular degeneration: Secondary | ICD-10-CM | POA: Diagnosis not present

## 2022-03-08 DIAGNOSIS — M6281 Muscle weakness (generalized): Secondary | ICD-10-CM | POA: Diagnosis not present

## 2022-03-08 DIAGNOSIS — H3530 Unspecified macular degeneration: Secondary | ICD-10-CM | POA: Diagnosis not present

## 2022-03-08 DIAGNOSIS — J949 Pleural condition, unspecified: Secondary | ICD-10-CM | POA: Diagnosis not present

## 2022-03-11 DIAGNOSIS — H3530 Unspecified macular degeneration: Secondary | ICD-10-CM | POA: Diagnosis not present

## 2022-03-11 DIAGNOSIS — J949 Pleural condition, unspecified: Secondary | ICD-10-CM | POA: Diagnosis not present

## 2022-03-11 DIAGNOSIS — M6281 Muscle weakness (generalized): Secondary | ICD-10-CM | POA: Diagnosis not present

## 2022-03-12 DIAGNOSIS — M6281 Muscle weakness (generalized): Secondary | ICD-10-CM | POA: Diagnosis not present

## 2022-03-12 DIAGNOSIS — J949 Pleural condition, unspecified: Secondary | ICD-10-CM | POA: Diagnosis not present

## 2022-03-12 DIAGNOSIS — H3530 Unspecified macular degeneration: Secondary | ICD-10-CM | POA: Diagnosis not present

## 2022-03-14 DIAGNOSIS — H3530 Unspecified macular degeneration: Secondary | ICD-10-CM | POA: Diagnosis not present

## 2022-03-14 DIAGNOSIS — M6281 Muscle weakness (generalized): Secondary | ICD-10-CM | POA: Diagnosis not present

## 2022-03-14 DIAGNOSIS — J949 Pleural condition, unspecified: Secondary | ICD-10-CM | POA: Diagnosis not present

## 2022-03-18 DIAGNOSIS — Z8719 Personal history of other diseases of the digestive system: Secondary | ICD-10-CM | POA: Diagnosis not present

## 2022-03-18 DIAGNOSIS — D509 Iron deficiency anemia, unspecified: Secondary | ICD-10-CM | POA: Diagnosis not present

## 2022-03-19 ENCOUNTER — Encounter: Payer: Self-pay | Admitting: Orthopedic Surgery

## 2022-03-19 ENCOUNTER — Non-Acute Institutional Stay (INDEPENDENT_AMBULATORY_CARE_PROVIDER_SITE_OTHER): Payer: Medicare HMO | Admitting: Orthopedic Surgery

## 2022-03-19 DIAGNOSIS — H3530 Unspecified macular degeneration: Secondary | ICD-10-CM | POA: Diagnosis not present

## 2022-03-19 DIAGNOSIS — J949 Pleural condition, unspecified: Secondary | ICD-10-CM | POA: Diagnosis not present

## 2022-03-19 DIAGNOSIS — Z0000 Encounter for general adult medical examination without abnormal findings: Secondary | ICD-10-CM | POA: Diagnosis not present

## 2022-03-19 DIAGNOSIS — M6281 Muscle weakness (generalized): Secondary | ICD-10-CM | POA: Diagnosis not present

## 2022-03-19 NOTE — Progress Notes (Signed)
Subjective:   [name] is a 81 y.o. female who presents for Medicare Annual (Subsequent) preventive examination.  Place of Service: [facility]- assisted living Provider: Windell Moulding, AGNP-C   Review of Systems     Cardiac Risk Factors include: advanced age (>37mn, >>14women);sedentary lifestyle;hypertension     Objective:    Today's Vitals   03/19/22 1517  BP: 108/70  Pulse: 66  Resp: 20  Temp: 98.1 F (36.7 C)  SpO2: 99%  Weight: 171 lb 3.2 oz (77.7 kg)  Height: '5\' 1"'$  (1.549 m)   Body mass index is 32.35 kg/m.     02/01/2022   11:10 AM 01/18/2022   11:40 AM 01/07/2022    8:00 PM 02/01/2021    9:09 PM 12/19/2020    1:04 AM 09/05/2017    6:43 PM 09/02/2017    8:20 PM  Advanced Directives  Does Patient Have a Medical Advance Directive? No Yes Yes No No Yes No  Type of Advance Directive  Out of facility DNR (pink MOST or yellow form) Out of facility DNR (pink MOST or yellow form)   [facility]   Does patient want to make changes to medical advance directive? No - Patient declined No - Patient declined    No - Patient declined   Copy of [place]?      No - copy requested   Would patient like information on creating a medical advance directive?     No - Patient declined  No - Patient declined    Current Medications (verified) Outpatient Encounter Medications as of 03/19/2022  Medication Sig   acetaminophen (TYLENOL) 500 MG tablet Take 1,000 mg by mouth 2 (two) times daily.   albuterol (VENTOLIN HFA) 108 (90 Base) MCG/ACT inhaler Inhale 2 puffs into the lungs every 4 (four) hours as needed for wheezing or shortness of breath.   alendronate (FOSAMAX) 70 MG tablet Take 70 mg by mouth every Sunday. Take with a full glass of water on an empty stomach.   Ascorbic Acid (VITAMIN C) 1000 MG tablet Take 1,000 mg by mouth every morning.   Cholecalciferol (VITAMIN D3) 50 MCG (2000 UT) TABS Take 2,000 Units by mouth every  morning.   docusate sodium (COLACE) 100 MG capsule Take 200 mg by mouth at bedtime.   dorzolamide-timolol (COSOPT) 22.3-6.8 MG/ML ophthalmic solution Place 1 drop into both eyes 2 (two) times daily.   ferrous gluconate (FERGON) 240 (27 FE) MG tablet Take 240 mg by mouth every morning. Take without food   HYDROcodone-acetaminophen (NORCO/VICODIN) 5-325 MG tablet Take 1 tablet by mouth every 6 (six) hours as needed (pain).   latanoprost (XALATAN) 0.005 % ophthalmic solution Place 1 drop into both eyes at bedtime.   loratadine (CLARITIN) 10 MG tablet Take 1 tablet (10 mg total) by mouth daily.   Multiple Vitamin-Folic Acid TABS Take 1 tablet by mouth every morning. Multivitamin with 4259mcg folic acid   Multiple Vitamins-Minerals (PRESERVISION AREDS 2) CAPS Take 1 capsule by mouth 2 (two) times daily.   Netarsudil Dimesylate (RHOPRESSA) 0.02 % SOLN Place 1 drop into both eyes at bedtime.   OXYGEN 2lpm with sleep only  [employer]   pantoprazole (PROTONIX) 40 MG tablet Take 40 mg by mouth 2 (two) times daily.   polyethylene glycol (MIRALAX / GLYCOLAX) 17 g packet Take 8.5 g by mouth daily as needed (constipation).   Probiotic Product (ALIGN) 4 MG CAPS Take 4 mg by  mouth at bedtime.   propranolol (INDERAL) 20 MG tablet Take 20 mg by mouth See admin instructions. Take one tablet (20 mg) by mouth daily at bedtime, may also take one tablet (20 mg) daily as needed for pulse over 120   rOPINIRole (REQUIP) 1 MG tablet Take 0.5 mg by mouth at bedtime as needed (restless legs/tremors).   sertraline (ZOLOFT) 100 MG tablet Take 1 tablet (100 mg total) by mouth daily.   simvastatin (ZOCOR) 20 MG tablet Take 20 mg by mouth every evening.   Sodium Fluoride (PREVIDENT 5000 BOOSTER PLUS) 1.1 % PSTE Place 1 Application onto teeth See admin instructions. Brush on teeth with a toothbrush after evening mouth care. Spit out excess and do not rinse.   umeclidinium-vilanterol (ANORO ELLIPTA) 62.5-25 MCG/ACT AEPB Inhale 1 puff  into the lungs every morning.   zinc oxide 20 % ointment Apply 1 Application topically See admin instructions. Apply topically to buttocks after every incontinent episode and as needed for redness   No facility-administered encounter medications on file as of 03/19/2022.    Allergies (verified) Morphine and related, Abaloparatide, Aspirin, Duloxetine hcl, Ventolin [albuterol], and Cefadroxil   History: Past Medical History:  Diagnosis Date   Anxiety    Arthritis    "bilateral knees, shoulders, elbows; neck, pretty widespread" (09/05/2017)   BPPV (benign paroxysmal positional vertigo)    Depression    GERD (gastroesophageal reflux disease)    Glaucoma, both eyes    Headache    "probably 2/month" (09/05/2017)   History of blood transfusion ~ 2008   "related to LGIB"   Hyperlipemia    Lower GI bleeding ~ 2008; 09/05/2017   "had to have blood transfusion"   Macular degeneration, bilateral    Osteopenia    Seasonal allergies    Skin cancer, basal cell 2001   "off my nose, left side"   Sleeping excessive    Tinnitus of both ears    Past Surgical History:  Procedure Laterality Date   BALLOON DILATION N/A 05/08/2021   Procedure: BALLOON DILATION;  Surgeon: Ronnette Juniper, MD;  Location: [facility];  Service: Gastroenterology;  Laterality: N/A;   BASAL CELL CARCINOMA EXCISION  2001   "off my nose, left side"   BIOPSY  05/08/2021   Procedure: BIOPSY;  Surgeon: Ronnette Juniper, MD;  Location: [facility] ENDOSCOPY;  Service: Gastroenterology;;   BLEPHAROPLASTY Bilateral    CATARACT EXTRACTION W/ INTRAOCULAR LENS  IMPLANT, BILATERAL Bilateral 1990's   ESOPHAGOGASTRODUODENOSCOPY (EGD) WITH PROPOFOL N/A 05/08/2021   Procedure: ESOPHAGOGASTRODUODENOSCOPY (EGD) WITH PROPOFOL;  Surgeon: Ronnette Juniper, MD;  Location: [facility];  Service: Gastroenterology;  Laterality: N/A;   ESOPHAGOGASTRODUODENOSCOPY (EGD) WITH PROPOFOL N/A 01/11/2022   Procedure: ESOPHAGOGASTRODUODENOSCOPY (EGD) WITH PROPOFOL;  Surgeon:  Clarene Essex, MD;  Location: [facility];  Service: Gastroenterology;  Laterality: N/A;   EYE SURGERY Bilateral    "to improve vision after cataract OR"   IR ANGIOGRAM FOLLOW UP STUDY  12/19/2020   IR ANGIOGRAM SELECTIVE EACH ADDITIONAL VESSEL  12/19/2020   IR ANGIOGRAM SELECTIVE EACH ADDITIONAL VESSEL  12/19/2020   IR ANGIOGRAM VISCERAL SELECTIVE  12/19/2020   IR EMBO ART  VEN HEMORR LYMPH EXTRAV  INC GUIDE ROADMAPPING  12/19/2020   IR US GUIDE VASC ACCESS RIGHT  12/19/2020   JOINT REPLACEMENT     STAPEDES SURGERY Left    "scraped stapedes because it was sticking when it wasn't suppose to"   [place] Left ~ 2008   TOTAL  KNEE ARTHROPLASTY  12/20/2011   Procedure: TOTAL KNEE ARTHROPLASTY;  Surgeon: Augustin Schooling, MD;  Location: [facility];  Service: Orthopedics;  Laterality: Right;  Right Total Knee Arthroplasty   TUBAL LIGATION  1980's   Family History  Problem Relation Age of Onset   Heart attack Mother    Heart disease Father    Heart failure Father    Bladder Cancer Father    Supraventricular tachycardia Sister    Hypertension Sister    Social History   Socioeconomic History   Marital status: Married    Spouse name: Clair Gulling    Number of children: 3   Years of education: College   Highest education level: Not on file  Occupational History   Occupation: Retired Pharmacist, hospital  Tobacco Use   Smoking status: Former    Packs/day: 0.75    Years: 37.00    Total pack years: 27.75    Types: Cigarettes    Quit date: 07/01/1992    Years since quitting: 29.7   Smokeless tobacco: Never  Vaping Use   Vaping Use: Never used  Substance and Sexual Activity   Alcohol use: Yes    Alcohol/week: 7.0 standard drinks of alcohol    Types: 7 Glasses of wine per week    Comment: 09/05/2017  "6 oz wine, 5 days/wk"   Drug use: No    Types: Hydrocodone   Sexual activity: Not Currently  Other Topics Concern   Not on file  Social History Narrative   1-2  cups of coffee a day    Social Determinants of Health   Financial Resource Strain: Low Risk  (03/19/2022)   Overall Financial Resource Strain (CARDIA)    Difficulty of Paying Living Expenses: Not hard at all  Food Insecurity: No Food Insecurity (03/19/2022)   Hunger Vital Sign    Worried About Running Out of Food in the Last Year: Never true    Ran Out of Food in the Last Year: Never true  Transportation Needs: No Transportation Needs (03/19/2022)   PRAPARE - Hydrologist (Medical): No    Lack of Transportation (Non-Medical): No  Physical Activity: Insufficiently Active (03/19/2022)   Exercise Vital Sign    Days of Exercise per Week: 2 days    Minutes of Exercise per Session: 20 min  Stress: No Stress Concern Present (03/19/2022)   [employer]    Feeling of Stress : Not at all  Social Connections: Moderately Isolated (03/19/2022)   Social Connection and Isolation Panel [NHANES]    Frequency of Communication with Friends and Family: More than three times a week    Frequency of Social Gatherings with Friends and Family: Twice a week    Attends Religious Services: Never    Marine scientist or Organizations: No    Attends Music therapist: Never    Marital Status: Married    Tobacco Counseling Counseling given: Not Answered   Clinical Intake:  Pre-visit preparation completed: Yes  Pain : No/denies pain     BMI - recorded: 32.35 Nutritional Status: BMI > 30  Obese Nutritional Risks: None Diabetes: No  How often do you need to have someone help you when you read instructions, pamphlets, or other written materials from your doctor or pharmacy?: 2 - Rarely What is the last grade level you completed in school?: 4 year college degree  Diabetic: No  Interpreter Needed?: No  Activities of Daily Living    03/19/2022    3:27 PM 05/03/2021   12:47 AM  In your  present state of health, do you have any difficulty performing the following activities:  Hearing? 1   Vision? 1   Difficulty concentrating or making decisions? 0   Walking or climbing stairs? 1   Dressing or bathing? 1   Doing errands, shopping? 1 0  Preparing Food and eating ? Y   Using the Toilet? N   In the past six months, have you accidently leaked urine? Y   Do you have problems with loss of bowel control? Y   Managing your Medications? Y   Managing your Finances? Y   Housekeeping or managing your Housekeeping? Y     Patient Care Team: Yvonna Alanis, NP as PCP - General (Adult Health Nurse Practitioner) Virgie Dad, MD as Consulting Physician (Internal Medicine)  Indicate any recent Medical Services you may have received from other than [facility] providers in the past year (date may be approximate).     Assessment:   This is a routine wellness examination for [facility].  Hearing/Vision screen No results found.  Dietary issues and exercise activities discussed: Current Exercise Habits: The patient does not participate in regular exercise at present, Exercise limited by: respiratory conditions(s);orthopedic condition(s)   Goals Addressed             This Visit's Progress    Maintain Mobility and Function   On track    Evidence-based guidance:  Acknowledge and validate impact of pain, loss of strength and potential disfigurement (hand osteoarthritis) on mental health and daily life, such as social isolation, anxiety, depression, impaired sexual relationship and   injury from falls.  Anticipate referral to physical or occupational therapy for assessment, therapeutic exercise and recommendation for adaptive equipment or assistive devices; encourage participation.  Assess impact on ability to perform activities of daily living, as well as engage in sports and leisure events or requirements of work or school.  Provide anticipatory guidance and reassurance about the benefit of  exercise to maintain function; acknowledge and normalize fear that exercise may worsen symptoms.  Encourage regular exercise, at least 10 minutes at a time for 45 minutes per week; consider yoga, water exercise and proprioceptive exercises; encourage use of wearable activity tracker to increase motivation and adherence.  Encourage maintenance or resumption of daily activities, including employment, as pain allows and with minimal exposure to trauma.  Assist patient to advocate for adaptations to the work environment.  Consider level of pain and function, gender, age, lifestyle, patient preference, quality of life, readiness and ?ocapacity to benefit? when recommending patients for orthopaedic surgery consultation.  Explore strategies, such as changes to medication regimen or activity that enables patient to anticipate and manage flare-ups that increase deconditioning and disability.  Explore patient preferences; encourage exposure to a broader range of activities that have been avoided for fear of experiencing pain.  Identify barriers to participation in therapy or exercise, such as pain with activity, anticipated or imagined pain.  Monitor postoperative joint replacement or any preexisting joint replacement for ongoing pain and loss of function; provide social support and encouragement throughout recovery.   Notes:        Depression Screen    03/19/2022    3:30 PM  PHQ 2/9 Scores  PHQ - 2 Score 0    Fall Risk    03/19/2022    3:27 PM  [place] in the  past year? 1  Number falls in past yr: 0  Injury with Fall? 0  Risk for fall due to : Orthopedic patient;History of fall(s)  Follow up Falls evaluation completed;Education provided;Falls prevention discussed    FALL RISK PREVENTION PERTAINING TO THE HOME:  Any stairs in or around the home? No  If so, are there any without handrails? No  Home free of loose throw rugs in walkways, pet beds, electrical cords, etc? Yes   Adequate lighting in your home to reduce risk of falls? Yes   ASSISTIVE DEVICES UTILIZED TO PREVENT FALLS:  Life alert? No  Use of a cane, walker or w/c? Yes  Grab bars in the bathroom? Yes  Shower chair or bench in shower? Yes  Elevated toilet seat or a handicapped toilet? Yes   TIMED UP AND GO:  Was the test performed? No .  Length of time to ambulate 10 feet: N/A sec.   Gait slow and steady with assistive device  Cognitive Function:    03/19/2022    3:28 PM  MMSE - Mini Mental State Exam  Not completed: Refused        Immunizations Immunization History  Administered Date(s) Administered   Influenza, High Dose Seasonal PF 03/14/2018   PFIZER(Purple Top)SARS-COV-2 Vaccination 07/25/2019, 08/16/2019   Pneumococcal-Unspecified 03/02/2011   Tdap 03/12/2018   Zoster Recombinat (Shingrix) 10/08/2017, 12/09/2017    TDAP status: Up to date  Flu Vaccine status: Due, Education has been provided regarding the importance of this vaccine. Advised may receive this vaccine at local pharmacy or Health Dept. Aware to provide a copy of the vaccination record if obtained from local pharmacy or Health Dept. Verbalized acceptance and understanding.  Pneumococcal vaccine status: Due, Education has been provided regarding the importance of this vaccine. Advised may receive this vaccine at local pharmacy or Health Dept. Aware to provide a copy of the vaccination record if obtained from local pharmacy or Health Dept. Verbalized acceptance and understanding.  Covid-19 vaccine status: Completed vaccines  Qualifies for Shingles Vaccine? Yes   Zostavax completed No   Shingrix Completed?: Yes  Screening Tests Health Maintenance  Topic Date Due   Pneumonia Vaccine 13+ Years old (1 - PCV) 11/14/2003   DEXA SCAN  Never done   COVID-19 Vaccine (3 - Pfizer series) 10/11/2019   INFLUENZA VACCINE  01/29/2022   TETANUS/TDAP  03/12/2028   Zoster Vaccines- Shingrix  Completed   HPV VACCINES   Aged Out    Health Maintenance  Health Maintenance Due  Topic Date Due   Pneumonia Vaccine 52+ Years old (1 - PCV) 11/14/2003   DEXA SCAN  Never done   COVID-19 Vaccine (3 - Pfizer series) 10/11/2019   INFLUENZA VACCINE  01/29/2022    Colorectal cancer screening: No longer required.   Mammogram status: Completed cannot remember. Repeat every year  Bone Density status: Completed cannot remember. Results reflect: Bone density results: OSTEOPOROSIS. Repeat every 2 years.  Lung Cancer Screening: (Low Dose CT Chest recommended if Age 38-80 years, 30 pack-year currently smoking OR have quit w/in 15years.) does not qualify.   Lung Cancer Screening Referral: No  Additional Screening:  Hepatitis C Screening: does not qualify; Completed   Vision Screening: Recommended annual ophthalmology exams for early detection of glaucoma and other disorders of the eye. Is the patient up to date with their annual eye exam?  Yes  Who is the provider or what is the name of the office in which the patient attends annual eye exams? Dr.  Eulas Post and [place] If pt is not established with a provider, would they like to be referred to a provider to establish care? No .   Dental Screening: Recommended annual dental exams for proper oral hygiene  Community Resource Referral / Chronic Care Management: CRR required this visit?  No   CCM required this visit?  No      Plan:     I have personally reviewed and noted the following in the patient's chart:   Medical and social history Use of alcohol, tobacco or illicit drugs  Current medications and supplements including opioid prescriptions. Patient is currently taking opioid prescriptions. Information provided to patient regarding non-opioid alternatives. Patient advised to discuss non-opioid treatment plan with their provider. Functional ability and status Nutritional status Physical activity Advanced directives List of other physicians Hospitalizations,  surgeries, and ER visits in previous 12 months Vitals Screenings to include cognitive, depression, and falls Referrals and appointments  In addition, I have reviewed and discussed with patient certain preventive protocols, quality metrics, and best practice recommendations. A written personalized care plan for preventive services as well as general preventive health recommendations were provided to patient.     Yvonna Alanis, NP   03/19/2022   Nurse Notes: Request vaccination record, last mammogram and bone density from Dr. Joylene Draft office- facility nurse to contact provider

## 2022-03-19 NOTE — Patient Instructions (Signed)
  Ms. Seaborn , Thank you for taking time to come for your Medicare Wellness Visit. I appreciate your ongoing commitment to your health goals. Please review the following plan we discussed and let me know if I can assist you in the future.   These are the goals we discussed:  Goals      Maintain Mobility and Function     Evidence-based guidance:  Acknowledge and validate impact of pain, loss of strength and potential disfigurement (hand osteoarthritis) on mental health and daily life, such as social isolation, anxiety, depression, impaired sexual relationship and   injury from falls.  Anticipate referral to physical or occupational therapy for assessment, therapeutic exercise and recommendation for adaptive equipment or assistive devices; encourage participation.  Assess impact on ability to perform activities of daily living, as well as engage in sports and leisure events or requirements of work or school.  Provide anticipatory guidance and reassurance about the benefit of exercise to maintain function; acknowledge and normalize fear that exercise may worsen symptoms.  Encourage regular exercise, at least 10 minutes at a time for 45 minutes per week; consider yoga, water exercise and proprioceptive exercises; encourage use of wearable activity tracker to increase motivation and adherence.  Encourage maintenance or resumption of daily activities, including employment, as pain allows and with minimal exposure to trauma.  Assist patient to advocate for adaptations to the work environment.  Consider level of pain and function, gender, age, lifestyle, patient preference, quality of life, readiness and ?ocapacity to benefit? when recommending patients for orthopaedic surgery consultation.  Explore strategies, such as changes to medication regimen or activity that enables patient to anticipate and manage flare-ups that increase deconditioning and disability.  Explore patient preferences; encourage  exposure to a broader range of activities that have been avoided for fear of experiencing pain.  Identify barriers to participation in therapy or exercise, such as pain with activity, anticipated or imagined pain.  Monitor postoperative joint replacement or any preexisting joint replacement for ongoing pain and loss of function; provide social support and encouragement throughout recovery.   Notes:         This is a list of the screening recommended for you and due dates:  Health Maintenance  Topic Date Due   Pneumonia Vaccine (1 - PCV) 11/14/2003   DEXA scan (bone density measurement)  Never done   COVID-19 Vaccine (3 - Pfizer series) 10/11/2019   Flu Shot  01/29/2022   Tetanus Vaccine  03/12/2028   Zoster (Shingles) Vaccine  Completed   HPV Vaccine  Aged Out

## 2022-04-18 ENCOUNTER — Non-Acute Institutional Stay: Payer: Medicare HMO | Admitting: Internal Medicine

## 2022-04-18 ENCOUNTER — Encounter: Payer: Self-pay | Admitting: Internal Medicine

## 2022-04-18 DIAGNOSIS — M810 Age-related osteoporosis without current pathological fracture: Secondary | ICD-10-CM | POA: Diagnosis not present

## 2022-04-18 DIAGNOSIS — J449 Chronic obstructive pulmonary disease, unspecified: Secondary | ICD-10-CM

## 2022-04-18 DIAGNOSIS — R69 Illness, unspecified: Secondary | ICD-10-CM | POA: Diagnosis not present

## 2022-04-18 DIAGNOSIS — M159 Polyosteoarthritis, unspecified: Secondary | ICD-10-CM

## 2022-04-18 DIAGNOSIS — E785 Hyperlipidemia, unspecified: Secondary | ICD-10-CM

## 2022-04-18 DIAGNOSIS — G2581 Restless legs syndrome: Secondary | ICD-10-CM

## 2022-04-18 DIAGNOSIS — R911 Solitary pulmonary nodule: Secondary | ICD-10-CM | POA: Diagnosis not present

## 2022-04-18 DIAGNOSIS — F32A Depression, unspecified: Secondary | ICD-10-CM

## 2022-04-18 DIAGNOSIS — N3946 Mixed incontinence: Secondary | ICD-10-CM | POA: Diagnosis not present

## 2022-04-18 DIAGNOSIS — F419 Anxiety disorder, unspecified: Secondary | ICD-10-CM | POA: Diagnosis not present

## 2022-04-18 DIAGNOSIS — K922 Gastrointestinal hemorrhage, unspecified: Secondary | ICD-10-CM

## 2022-04-18 DIAGNOSIS — I10 Essential (primary) hypertension: Secondary | ICD-10-CM | POA: Diagnosis not present

## 2022-04-18 DIAGNOSIS — F418 Other specified anxiety disorders: Secondary | ICD-10-CM

## 2022-04-18 DIAGNOSIS — D509 Iron deficiency anemia, unspecified: Secondary | ICD-10-CM | POA: Diagnosis not present

## 2022-04-18 NOTE — Progress Notes (Signed)
Location:  [facility] Room Number: 20-A Place of Service:  ALF (13) Provider:Anyla Israelson, Rene Kocher, MD    Yvonna Alanis, NP  Patient Care Team: Yvonna Alanis, NP as PCP - General (Adult Health Nurse Practitioner) Virgie Dad, MD as Consulting Physician (Internal Medicine)  Extended Emergency Contact Information Primary Emergency Contact: Hucker,[name] Address: [address]          [place], [place] [address] Johnnette Litter of [place] Phone: [phone] Mobile Phone: [phone] Relation: Spouse Secondary Emergency Contact: Sharia, [place] Mobile Phone: [phone] Relation: Son Interpreter needed? No  Code Status:   Goals of care: Advanced Directive information    04/18/2022    2:38 PM  Advanced Directives  Does Patient Have a Medical Advance Directive? No  Does patient want to make changes to medical advance directive? No - Patient declined     Chief Complaint  Patient presents with   Acute Visit   Health Maintenance    Discussed the need for flu , pnuemonia, covid vaccine. Also need a Bone density done    HPI:  Pt is a 81 y.o. 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  Follows with [facility] Recurrent falls  Patient is a new admit in [place] She was living with her husband in at home but was having many falls and he was unable to take care of her Her main problem today Swelling in right ankle with some pain in great toe She has h/o bimalleolar fracture of the right ankle in 08/22 She continues have swelling in that ankle  Urinary incontinence Mostly urge some  signs of Stress leaking Already treated for UTI  Is walking with her walker Very slow in her ADLS   Past Medical History:  Diagnosis Date   Anxiety    Arthritis    "bilateral knees, shoulders, elbows; neck, pretty widespread" (09/05/2017)   BPPV (benign paroxysmal positional vertigo)    Depression    GERD (gastroesophageal reflux disease)    Glaucoma, both eyes    Headache    "probably 2/month" (09/05/2017)   History of blood transfusion ~ 2008   "related to LGIB"   Hyperlipemia    Lower GI bleeding ~ 2008; 09/05/2017   "had to have blood transfusion"   Macular degeneration, bilateral    Osteopenia    Seasonal allergies    Skin cancer, basal cell 2001   "off my nose, left side"   Sleeping excessive    Tinnitus of both ears    Past Surgical History:  Procedure Laterality Date   BALLOON DILATION N/A 05/08/2021   Procedure: BALLOON DILATION;  Surgeon: Ronnette Juniper, MD;  Location: [facility];  Service: Gastroenterology;  Laterality: N/A;   BASAL CELL CARCINOMA EXCISION  2001   "off my nose, left side"   BIOPSY  05/08/2021   Procedure: BIOPSY;  Surgeon: Ronnette Juniper, MD;  Location: [facility];  Service: Gastroenterology;;   BLEPHAROPLASTY Bilateral    CATARACT EXTRACTION W/ INTRAOCULAR LENS  IMPLANT, BILATERAL Bilateral 1990's   ESOPHAGOGASTRODUODENOSCOPY (EGD) WITH PROPOFOL N/A 05/08/2021   Procedure: ESOPHAGOGASTRODUODENOSCOPY (EGD) WITH  PROPOFOL;  Surgeon: Ronnette Juniper, MD;  Location: [facility];  Service: Gastroenterology;  Laterality: N/A;   ESOPHAGOGASTRODUODENOSCOPY (EGD) WITH PROPOFOL N/A 01/11/2022   Procedure: ESOPHAGOGASTRODUODENOSCOPY (EGD) WITH PROPOFOL;  Surgeon: Clarene Essex, MD;  Location: [facility];  Service: Gastroenterology;  Laterality: N/A;   EYE SURGERY Bilateral    "to improve vision after cataract OR"   IR ANGIOGRAM FOLLOW UP STUDY  12/19/2020   IR ANGIOGRAM SELECTIVE EACH ADDITIONAL VESSEL  12/19/2020   IR ANGIOGRAM SELECTIVE EACH ADDITIONAL VESSEL   12/19/2020   IR ANGIOGRAM VISCERAL SELECTIVE  12/19/2020   IR EMBO ART  VEN HEMORR LYMPH EXTRAV  INC GUIDE ROADMAPPING  12/19/2020   IR US GUIDE VASC ACCESS RIGHT  12/19/2020   JOINT REPLACEMENT     STAPEDES SURGERY Left    "scraped stapedes because it was sticking when it wasn't suppose to"   [place] Left ~ 2008   TOTAL KNEE ARTHROPLASTY  12/20/2011   Procedure: TOTAL KNEE ARTHROPLASTY;  Surgeon: Augustin Schooling, MD;  Location: [facility];  Service: Orthopedics;  Laterality: Right;  Right Total Knee Arthroplasty   TUBAL LIGATION  1980's    Allergies  Allergen Reactions   Morphine And Related Itching   Abaloparatide Other (See Comments)    Unknown reaction   Aspirin Other (See Comments)    Unknown reaction   Duloxetine Hcl Other (See Comments)    Unknown reaction   Ventolin [Albuterol] Other (See Comments)    rapid heart beat   Cefadroxil Hives    Patient can take amoxicillin and cipro    Outpatient Encounter Medications as of 04/18/2022  Medication Sig   acetaminophen (TYLENOL) 500 MG tablet Take 1,000 mg by mouth 2 (two) times daily.   albuterol (VENTOLIN HFA) 108 (90 Base) MCG/ACT inhaler Inhale 2 puffs into the lungs every 4 (four) hours as needed for wheezing or shortness of breath.   alendronate (FOSAMAX) 70 MG tablet Take 70 mg by mouth every Sunday. Take with a full glass of water on an empty stomach.   Ascorbic Acid (VITAMIN C) 1000 MG tablet Take 1,000 mg by mouth every morning.   Cholecalciferol (VITAMIN D3) 50 MCG (2000 UT) TABS Take 2,000 Units by mouth every morning.   docusate sodium (COLACE) 100 MG capsule Take 200 mg by mouth at bedtime.   dorzolamide-timolol (COSOPT) 22.3-6.8 MG/ML ophthalmic solution Place 1 drop into both eyes 2 (two) times daily.   ferrous gluconate (FERGON) 240 (27 FE) MG tablet Take 240 mg by mouth every morning. Take without food   HYDROcodone-acetaminophen (NORCO/VICODIN) 5-325 MG tablet  Take 1 tablet by mouth every 6 (six) hours as needed (pain).   latanoprost (XALATAN) 0.005 % ophthalmic solution Place 1 drop into both eyes at bedtime.   loratadine (CLARITIN) 10 MG tablet Take 1 tablet (10 mg total) by mouth daily.   Multiple Vitamin-Folic Acid TABS Take 1 tablet by mouth every morning. Multivitamin with 751 mcg folic acid   Multiple Vitamins-Minerals (PRESERVISION AREDS 2) CAPS Take 1 capsule by mouth 2 (two) times daily.   OXYGEN 2lpm with sleep only  [employer]   pantoprazole (PROTONIX) 40 MG tablet Take 40 mg by mouth 2 (two) times daily.   polyethylene glycol (MIRALAX / GLYCOLAX) 17 g packet Take 8.5 g by mouth daily as needed (constipation).   Probiotic Product (ALIGN) 4 MG CAPS Take 4 mg by mouth at bedtime.   propranolol (INDERAL) 20 MG tablet Take 20  mg by mouth See admin instructions. Take one tablet (20 mg) by mouth daily at bedtime, may also take one tablet (20 mg) daily as needed for pulse over 120   rOPINIRole (REQUIP) 1 MG tablet Take 0.5 mg by mouth at bedtime as needed (restless legs/tremors).   sertraline (ZOLOFT) 100 MG tablet Take 1 tablet (100 mg total) by mouth daily.   simvastatin (ZOCOR) 20 MG tablet Take 20 mg by mouth every evening.   Sodium Fluoride (PREVIDENT 5000 BOOSTER PLUS) 1.1 % PSTE Place 1 Application onto teeth See admin instructions. Brush on teeth with a toothbrush after evening mouth care. Spit out excess and do not rinse.   umeclidinium-vilanterol (ANORO ELLIPTA) 62.5-25 MCG/ACT AEPB Inhale 1 puff into the lungs every morning.   zinc oxide 20 % ointment Apply 1 Application topically See admin instructions. Apply topically to buttocks after every incontinent episode and as needed for redness   Netarsudil Dimesylate (RHOPRESSA) 0.02 % SOLN Place 1 drop into both eyes at bedtime.   No facility-administered encounter medications on file as of 04/18/2022.    Review of Systems  Constitutional:  Negative for activity change and appetite change.   HENT: Negative.    Respiratory:  Negative for cough and shortness of breath.   Cardiovascular:  Positive for leg swelling.  Gastrointestinal:  Positive for constipation.  Genitourinary:  Positive for urgency.  Musculoskeletal:  Positive for arthralgias, back pain and gait problem. Negative for myalgias.  Skin: Negative.   Neurological:  Negative for dizziness and weakness.  Psychiatric/Behavioral:  Negative for confusion, dysphoric mood and sleep disturbance.     Immunization History  Administered Date(s) Administered   Influenza, High Dose Seasonal PF 03/14/2018   PFIZER(Purple Top)SARS-COV-2 Vaccination 07/25/2019, 08/16/2019, 12/12/2021   Pneumococcal-Unspecified 03/02/2011   Tdap 03/12/2018   Zoster Recombinat (Shingrix) 10/08/2017, 12/09/2017   Pertinent  Health Maintenance Due  Topic Date Due   DEXA SCAN  Never done   INFLUENZA VACCINE  01/29/2022      01/13/2022    7:50 AM 01/13/2022    8:00 PM 01/14/2022    8:45 AM 03/19/2022    3:27 PM 04/18/2022    2:39 PM  [place] in the past year?    1 1  Was there an injury with Fall?    0 1  Fall Risk Category Calculator    1 2  Fall Risk Category    Low Moderate  Patient Fall Risk Level Moderate fall risk Moderate fall risk Moderate fall risk    Patient at Risk for Falls Due to    Orthopedic patient;History of fall(s) History of fall(s)  Fall risk Follow up    Falls evaluation completed;Education provided;Falls prevention discussed Falls evaluation completed   Functional Status Survey:    Vitals:   04/18/22 1426  BP: 127/67  Pulse: 79  Resp: 17  Temp: (!) 97 F (36.1 C)  TempSrc: Temporal  SpO2: 97%  Weight: 171 lb 3.2 oz (77.7 kg)  Height: '5\' 1"'$  (1.549 m)   Body mass index is 32.35 kg/m. Physical Exam Vitals reviewed.  Constitutional:      Appearance: Normal appearance.  HENT:     Head: Normocephalic.     Nose: Nose normal.     Mouth/Throat:     Mouth: Mucous membranes are moist.     Pharynx:  Oropharynx is clear.  Eyes:     Pupils: Pupils are equal, round, and reactive to light.  Cardiovascular:     Rate  and Rhythm: Normal rate and regular rhythm.     Pulses: Normal pulses.     Heart sounds: Normal heart sounds. No murmur heard. Pulmonary:     Effort: Pulmonary effort is normal.     Breath sounds: Normal breath sounds.  Abdominal:     General: Abdomen is flat. Bowel sounds are normal.     Palpations: Abdomen is soft.  Musculoskeletal:        General: Swelling present.     Cervical back: Neck supple.     Comments: Right more then left But no signs of any pain or tenderness or redness  Skin:    General: Skin is warm.  Neurological:     General: No focal deficit present.     Mental Status: She is alert and oriented to person, place, and time.  Psychiatric:        Mood and Affect: Mood normal.        Thought Content: Thought content normal.     Labs reviewed: Recent Labs    05/03/21 0048 05/04/21 1817 05/05/21 0009 05/05/21 0802 01/11/22 1005 01/11/22 2146 01/12/22 0653 01/13/22 0035 01/14/22 0309  NA 137   < > 138   < >  --    < > 142 141 141  K 3.6   < > 3.9   < >  --    < > 3.8 3.7 3.8  CL 104   < > 99   < >  --    < > 116* 110 110  CO2 29   < > 30   < >  --    < > 20* 23 26  GLUCOSE 106*   < > 106*   < >  --    < > 116* 123* 125*  BUN <5*   < > 7*   < >  --    < > '15 10 11  '$ CREATININE 0.46   < > 0.57   < >  --    < > 0.48 0.59 0.58  CALCIUM 8.4*   < > 8.6*   < >  --    < > 7.8* 8.5* 8.5*  MG 2.1  --  2.0   < > 2.0  --   --  1.9 2.1  PHOS 3.7  --  3.2  --   --   --   --   --   --    < > = values in this interval not displayed.   Recent Labs    01/07/22 1950 01/08/22 0500 01/11/22 2146  AST '19 16 23  '$ ALT '14 13 15  '$ ALKPHOS 48 36* 32*  BILITOT 0.4 0.4 0.4  PROT 6.0* 5.0* 4.5*  ALBUMIN 3.5 3.0* 2.6*   Recent Labs    01/10/22 1544 01/11/22 0832 01/12/22 0653 01/13/22 0035 01/14/22 0309  WBC 9.3   < > 5.7 6.7 6.6  NEUTROABS 5.3  --   --   3.9 3.7  HGB 7.7*   < > 7.8* 7.4* 8.9*  HCT 24.2*   < > 23.5* 22.3* 27.9*  MCV 98.8   < > 94.8 92.5 95.2  PLT 169   < > 127* 138* 151   < > = values in this interval not displayed.   Lab Results  Component Value Date   TSH 1.88 11/18/2017   No results found for: "HGBA1C" No results found for: "CHOL", "HDL", "LDLCALC", "LDLDIRECT", "TRIG", "CHOLHDL"  Significant Diagnostic Results in last 30 days:  No results found.  Assessment/Plan 1. Mixed stress and urge urinary incontinence Start on Myrbetriq If no improvement will increase the dose to 50 mg/Urology consult  2. Iron deficiency anemia, unspecified iron deficiency anemia type On Iron HGB stable  3. Gastrointestinal hemorrhage, unspecified gastrointestinal hemorrhage type On Protonix   4. Hyperlipidemia, unspecified hyperlipidemia type On statin LDL 72  5. Primary osteoarthritis involving multiple joints On Norco PRN 'Will like to taper her off it  6. Depression with anxiety Continue Zoloft  7. COPD GOLD II  Oxygen at night On Anoro  8. Restless leg syndrome Continue Requip  9. Senile osteoporosis Fosamax ? Started in 12/22 Will check with [facility]  10. Tachycardia history  On Propanolol prn   11 Right ankle swelling Exam does not show anything worrisome Continue Ted hoses And monitor Working with therapy  12. Anxiety and depression On Zoloft    Family/ staff Communication:   Labs/tests ordered:

## 2022-04-18 NOTE — Progress Notes (Signed)
This encounter was created in error - please disregard.

## 2022-05-01 ENCOUNTER — Encounter: Payer: Self-pay | Admitting: Orthopedic Surgery

## 2022-05-01 ENCOUNTER — Non-Acute Institutional Stay: Payer: Medicare HMO | Admitting: Orthopedic Surgery

## 2022-05-01 DIAGNOSIS — D509 Iron deficiency anemia, unspecified: Secondary | ICD-10-CM | POA: Diagnosis not present

## 2022-05-01 DIAGNOSIS — K921 Melena: Secondary | ICD-10-CM

## 2022-05-01 DIAGNOSIS — D500 Iron deficiency anemia secondary to blood loss (chronic): Secondary | ICD-10-CM | POA: Diagnosis not present

## 2022-05-01 DIAGNOSIS — K922 Gastrointestinal hemorrhage, unspecified: Secondary | ICD-10-CM | POA: Diagnosis not present

## 2022-05-01 LAB — CBC AND DIFFERENTIAL
HCT: 33 — AB (ref 36–46)
Hemoglobin: 11.2 — AB (ref 12.0–16.0)
Neutrophils Absolute: 5143
Platelets: 166 10*3/uL (ref 150–400)
WBC: 7

## 2022-05-01 LAB — CBC: RBC: 3.64 — AB (ref 3.87–5.11)

## 2022-05-01 MED ORDER — HYDROCORTISONE (PERIANAL) 2.5 % EX CREA: 1.0000 | TOPICAL_CREAM | Freq: Two times a day (BID) | CUTANEOUS | 0 refills | Status: DC

## 2022-05-01 NOTE — Progress Notes (Signed)
Location:  [facility] Room Number: 20/A Place of Service:  ALF [phone]) Provider:  Yvonna Alanis, NP   Yvonna Alanis, NP  Patient Care Team: Yvonna Alanis, NP as PCP - General (Adult Health Nurse Practitioner) Virgie Dad, MD as Consulting Physician (Internal Medicine)  Extended Emergency Contact Information Primary Emergency Contact: Labrecque,[name] Address: [address]          [place], [place] [address] Johnnette Litter of [place] Phone: [phone] Mobile Phone: [phone] Relation: Spouse Secondary Emergency Contact: Delayni, [place] Mobile Phone: [phone] Relation: Son Interpreter needed? No  Code Status:  Full code Goals of care: Advanced Directive information    04/18/2022    2:38 PM  Advanced Directives  Does Patient Have a Medical Advance Directive? No  Does patient want to make changes to medical advance directive? No - Patient declined     Chief Complaint  Patient presents with   Acute Visit    Rectal bleeding     HPI:  Pt is a 81 y.o. female seen today for acute visit due to rectal bleeding.   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   Hospitalized 07/10-07/17 due to acute GI bleed. EGD 01/11/2022, noted multiple gastric polyps but otherwise normal. She was given 2 units PRBC during stay. PPI also started. Today, she reports BRBPR while using bathroom this morning. Hemoccult positive. Denies straining or hard stools. She did notice mild abdominal pain for about an hour last night, but subsided on its own. She is not on anticoagulation. Remains on Protonix 40 mg BID and colace. She is followed by Dr. Arta Silence with [facility], last seen 02/05/2022.    Past Medical History:  Diagnosis Date   Anxiety    Arthritis    "bilateral knees, shoulders, elbows; neck, pretty widespread" (09/05/2017)   BPPV (benign paroxysmal positional vertigo)    Depression    GERD (gastroesophageal  reflux disease)    Glaucoma, both eyes    Headache    "probably 2/month" (09/05/2017)   History of blood transfusion ~ 2008   "related to LGIB"   Hyperlipemia    Lower GI bleeding ~ 2008; 09/05/2017   "had to have blood transfusion"   Macular degeneration, bilateral    Osteopenia    Seasonal allergies    Skin cancer, basal cell 2001   "off my nose, left side"   Sleeping excessive    Tinnitus of both ears    Past Surgical History:  Procedure Laterality Date   BALLOON DILATION N/A 05/08/2021   Procedure: BALLOON DILATION;  Surgeon: Ronnette Juniper, MD;  Location: [facility];  Service: Gastroenterology;  Laterality: N/A;   BASAL CELL CARCINOMA EXCISION  2001   "off my nose, left side"   BIOPSY  05/08/2021   Procedure: BIOPSY;  Surgeon: Ronnette Juniper, MD;  [facility] ENDOSCOPY;  Service: Gastroenterology;;   BLEPHAROPLASTY Bilateral    CATARACT EXTRACTION W/ INTRAOCULAR LENS  IMPLANT, BILATERAL Bilateral 1990's   ESOPHAGOGASTRODUODENOSCOPY (EGD) WITH PROPOFOL N/A 05/08/2021   Procedure: ESOPHAGOGASTRODUODENOSCOPY (EGD) WITH PROPOFOL;  Surgeon: Ronnette Juniper, MD;  Location: [facility];  Service: Gastroenterology;  Laterality: N/A;   ESOPHAGOGASTRODUODENOSCOPY (EGD) WITH PROPOFOL N/A 01/11/2022   Procedure: ESOPHAGOGASTRODUODENOSCOPY (EGD) WITH PROPOFOL;  Surgeon: Clarene Essex, MD;  Location: [facility];  Service: Gastroenterology;  Laterality: N/A;   EYE SURGERY Bilateral    "to improve vision after cataract OR"   IR ANGIOGRAM FOLLOW UP STUDY  12/19/2020   IR ANGIOGRAM SELECTIVE EACH ADDITIONAL VESSEL  12/19/2020   IR ANGIOGRAM SELECTIVE EACH ADDITIONAL VESSEL  12/19/2020   IR ANGIOGRAM VISCERAL SELECTIVE  12/19/2020   IR EMBO ART  VEN HEMORR LYMPH EXTRAV  INC GUIDE ROADMAPPING  12/19/2020   IR US GUIDE VASC ACCESS RIGHT  12/19/2020   JOINT REPLACEMENT     STAPEDES SURGERY Left    "scraped stapedes because it was sticking when it wasn't suppose to"   [place] Left ~ 2008   TOTAL KNEE ARTHROPLASTY  12/20/2011   Procedure: TOTAL KNEE ARTHROPLASTY;  Surgeon: Augustin Schooling, MD;  Location: [facility];  Service: Orthopedics;  Laterality: Right;  Right Total Knee Arthroplasty   TUBAL LIGATION  1980's    Allergies  Allergen Reactions   Morphine And Related Itching   Abaloparatide Other (See Comments)    Unknown reaction   Aspirin Other (See Comments)    Unknown reaction   Duloxetine Hcl Other (See Comments)    Unknown reaction   Ventolin [Albuterol] Other (See Comments)    rapid heart beat   Cefadroxil Hives    Patient can take amoxicillin and cipro    Outpatient Encounter Medications as of 05/01/2022  Medication Sig   acetaminophen (TYLENOL) 500 MG tablet Take 1,000 mg by mouth 2 (two) times daily.   albuterol (VENTOLIN HFA) 108 (90 Base) MCG/ACT inhaler Inhale 2 puffs into the lungs every 4 (four) hours as needed for wheezing or shortness of breath.   alendronate (FOSAMAX) 70 MG tablet Take 70 mg by mouth every Sunday. Take with a full glass of water on an empty stomach.   Ascorbic Acid (VITAMIN C) 1000 MG tablet Take 1,000 mg by mouth every morning.   Cholecalciferol (VITAMIN D3) 50 MCG (2000 UT) TABS Take 2,000 Units by mouth every morning.   docusate sodium (COLACE) 100 MG capsule Take 200 mg by mouth at bedtime.   dorzolamide-timolol (COSOPT) 22.3-6.8 MG/ML ophthalmic solution Place 1 drop into both eyes 2 (two) times daily.   ferrous gluconate (FERGON) 240 (27 FE) MG tablet Take 240 mg by mouth every morning. Take without food   HYDROcodone-acetaminophen (NORCO/VICODIN) 5-325 MG tablet Take 1 tablet by mouth every 6 (six) hours as needed (pain).   latanoprost (XALATAN) 0.005 % ophthalmic solution Place 1 drop into both eyes at bedtime.   loratadine (CLARITIN) 10 MG tablet Take 1 tablet (10 mg total) by mouth daily.   Multiple Vitamin-Folic Acid TABS Take 1 tablet by mouth every morning. Multivitamin with 132 mcg folic  acid   Multiple Vitamins-Minerals (PRESERVISION AREDS 2) CAPS Take 1 capsule by mouth 2 (two) times daily.   Netarsudil Dimesylate (RHOPRESSA) 0.02 % SOLN Place 1 drop into both eyes at bedtime.   OXYGEN 2lpm with sleep only  [facility]   pantoprazole (PROTONIX) 40 MG tablet Take 40 mg by mouth 2 (two) times daily.   polyethylene glycol (MIRALAX / GLYCOLAX) 17 g packet Take 8.5 g by mouth daily as needed (constipation).   Probiotic Product (ALIGN) 4 MG CAPS Take 4 mg by mouth at bedtime.   propranolol (INDERAL) 20 MG tablet Take 20 mg by mouth See admin instructions. Take one tablet (20 mg) by mouth daily at bedtime, may also take one tablet (20 mg) daily as needed for pulse over 120   rOPINIRole (REQUIP) 1 MG tablet Take 0.5 mg by mouth at bedtime as needed (restless legs/tremors).   sertraline (ZOLOFT)  100 MG tablet Take 1 tablet (100 mg total) by mouth daily.   simvastatin (ZOCOR) 20 MG tablet Take 20 mg by mouth every evening.   Sodium Fluoride (PREVIDENT 5000 BOOSTER PLUS) 1.1 % PSTE Place 1 Application onto teeth See admin instructions. Brush on teeth with a toothbrush after evening mouth care. Spit out excess and do not rinse.   umeclidinium-vilanterol (ANORO ELLIPTA) 62.5-25 MCG/ACT AEPB Inhale 1 puff into the lungs every morning.   zinc oxide 20 % ointment Apply 1 Application topically See admin instructions. Apply topically to buttocks after every incontinent episode and as needed for redness   No facility-administered encounter medications on file as of 05/01/2022.    Review of Systems  Constitutional:  Negative for activity change, appetite change, chills, fatigue and fever.  HENT:  Negative for trouble swallowing.   Eyes:  Negative for visual disturbance.  Respiratory:  Negative for cough, shortness of breath and wheezing.   Cardiovascular:  Negative for chest pain and leg swelling.  Gastrointestinal:  Positive for abdominal pain, anal bleeding and blood in stool. Negative for  abdominal distention, constipation, diarrhea, nausea, rectal pain and vomiting.  Genitourinary:  Negative for dysuria, frequency and hematuria.  Musculoskeletal:  Positive for arthralgias and gait problem.  Skin:  Negative for wound.  Neurological:  Positive for weakness. Negative for dizziness and headaches.  Psychiatric/Behavioral:  Negative for confusion and dysphoric mood. The patient is not nervous/anxious.     Immunization History  Administered Date(s) Administered   Influenza, High Dose Seasonal PF 03/14/2018   PFIZER(Purple Top)SARS-COV-2 Vaccination 07/25/2019, 08/16/2019, 12/12/2021   Pneumococcal-Unspecified 03/02/2011   Tdap 03/12/2018   Zoster Recombinat (Shingrix) 10/08/2017, 12/09/2017   Pertinent  Health Maintenance Due  Topic Date Due   DEXA SCAN  Never done   INFLUENZA VACCINE  01/29/2022      01/13/2022    7:50 AM 01/13/2022    8:00 PM 01/14/2022    8:45 AM 03/19/2022    3:27 PM 04/18/2022    2:39 PM  [place] in the past year?    1 1  Was there an injury with Fall?    0 1  Fall Risk Category Calculator    1 2  Fall Risk Category    Low Moderate  Patient Fall Risk Level Moderate fall risk Moderate fall risk Moderate fall risk    Patient at Risk for Falls Due to    Orthopedic patient;History of fall(s) History of fall(s)  Fall risk Follow up    Falls evaluation completed;Education provided;Falls prevention discussed Falls evaluation completed   Functional Status Survey:    Vitals:   05/01/22 1136  BP: (!) 142/80  Pulse: 88  Resp: 20  Temp: 98.2 F (36.8 C)  SpO2: 92%  Weight: 171 lb 3.2 oz (77.7 kg)  Height: '5\' 1"'$  (1.549 m)   Body mass index is 32.35 kg/m. Physical Exam Vitals reviewed.  Constitutional:      General: She is not in acute distress. HENT:     Head: Normocephalic.  Eyes:     General:        Right eye: No discharge.        Left eye: No discharge.  Cardiovascular:     Rate and Rhythm: Normal rate and regular rhythm.      Pulses: Normal pulses.     Heart sounds: Normal heart sounds.  Pulmonary:     Effort: Pulmonary effort is normal. No respiratory distress.     Breath  sounds: Normal breath sounds. No wheezing.  Abdominal:     General: Bowel sounds are normal. There is no distension.     Palpations: Abdomen is soft. There is no mass.     Tenderness: There is no abdominal tenderness. There is no guarding or rebound.     Hernia: No hernia is present.  Genitourinary:    Rectum: Guaiac result positive. External hemorrhoid present. No tenderness.     Comments: BRBPR, moderate amount, on exam, small external hemorrhoid present Musculoskeletal:     Cervical back: Neck supple.     Right lower leg: Edema present.     Left lower leg: Edema present.     Comments: Non pitting  Skin:    General: Skin is dry.     Capillary Refill: Capillary refill takes less than 2 seconds.  Neurological:     General: No focal deficit present.     Mental Status: She is alert and oriented to person, place, and time.     Motor: Weakness present.     Gait: Gait abnormal.     Comments: walker  Psychiatric:        Mood and Affect: Mood normal.        Behavior: Behavior normal.     Labs reviewed: Recent Labs    05/03/21 0048 05/04/21 1817 05/05/21 0009 05/05/21 0802 01/11/22 1005 01/11/22 2146 01/12/22 0653 01/13/22 0035 01/14/22 0309  NA 137   < > 138   < >  --    < > 142 141 141  K 3.6   < > 3.9   < >  --    < > 3.8 3.7 3.8  CL 104   < > 99   < >  --    < > 116* 110 110  CO2 29   < > 30   < >  --    < > 20* 23 26  GLUCOSE 106*   < > 106*   < >  --    < > 116* 123* 125*  BUN <5*   < > 7*   < >  --    < > '15 10 11  '$ CREATININE 0.46   < > 0.57   < >  --    < > 0.48 0.59 0.58  CALCIUM 8.4*   < > 8.6*   < >  --    < > 7.8* 8.5* 8.5*  MG 2.1  --  2.0   < > 2.0  --   --  1.9 2.1  PHOS 3.7  --  3.2  --   --   --   --   --   --    < > = values in this interval not displayed.   Recent Labs    01/07/22 1950 01/08/22 0500  01/11/22 2146  AST '19 16 23  '$ ALT '14 13 15  '$ ALKPHOS 48 36* 32*  BILITOT 0.4 0.4 0.4  PROT 6.0* 5.0* 4.5*  ALBUMIN 3.5 3.0* 2.6*   Recent Labs    01/12/22 0653 01/13/22 0035 01/14/22 0309 02/18/22 0000  WBC 5.7 6.7 6.6 4.5  NEUTROABS  --  3.9 3.7 2,358.00  HGB 7.8* 7.4* 8.9* 11.3*  HCT 23.5* 22.3* 27.9* 34*  MCV 94.8 92.5 95.2  --   PLT 127* 138* 151 158   Lab Results  Component Value Date   TSH 1.88 11/18/2017   No results found for: "HGBA1C" Lab Results  Component Value Date  CHOL 147 02/18/2022   HDL 51 02/18/2022   LDLCALC 72 02/18/2022   TRIG 162 (A) 02/18/2022    Significant Diagnostic Results in last 30 days:  No results found.  Assessment/Plan: 1. Hematochezia - followed by [facility]- Dr. 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 Iron deficiency anemia, unspecified iron deficiency anemia type - hgb 11.3 02/18/2022 - cont ferrous sulfate    Family/ staff Communication: plan discussed with patient and nurse  Labs/tests ordered:  cbc/diff- draw today, urgent GI referral

## 2022-05-02 ENCOUNTER — Encounter (HOSPITAL_COMMUNITY): Payer: Self-pay | Admitting: Emergency Medicine

## 2022-05-02 ENCOUNTER — Encounter: Payer: Self-pay | Admitting: Internal Medicine

## 2022-05-02 ENCOUNTER — Inpatient Hospital Stay (HOSPITAL_COMMUNITY)
Admission: EM | Admit: 2022-05-02 | Discharge: 2022-05-07 | DRG: 378 | Disposition: A | Discharge status: Nursing Home (Includes ICF) | Payer: Medicare HMO | Source: Skilled Nursing Facility | Attending: Internal Medicine | Admitting: Internal Medicine

## 2022-05-02 ENCOUNTER — Non-Acute Institutional Stay: Payer: Medicare HMO | Admitting: Internal Medicine

## 2022-05-02 ENCOUNTER — Other Ambulatory Visit: Payer: Self-pay

## 2022-05-02 DIAGNOSIS — Z961 Presence of intraocular lens: Secondary | ICD-10-CM | POA: Diagnosis present

## 2022-05-02 DIAGNOSIS — J302 Other seasonal allergic rhinitis: Secondary | ICD-10-CM | POA: Diagnosis present

## 2022-05-02 DIAGNOSIS — D62 Acute posthemorrhagic anemia: Secondary | ICD-10-CM | POA: Diagnosis present

## 2022-05-02 DIAGNOSIS — I10 Essential (primary) hypertension: Secondary | ICD-10-CM | POA: Diagnosis present

## 2022-05-02 DIAGNOSIS — F419 Anxiety disorder, unspecified: Secondary | ICD-10-CM | POA: Diagnosis present

## 2022-05-02 DIAGNOSIS — K635 Polyp of colon: Secondary | ICD-10-CM | POA: Diagnosis present

## 2022-05-02 DIAGNOSIS — D500 Iron deficiency anemia secondary to blood loss (chronic): Secondary | ICD-10-CM | POA: Diagnosis not present

## 2022-05-02 DIAGNOSIS — Z7951 Long term (current) use of inhaled steroids: Secondary | ICD-10-CM

## 2022-05-02 DIAGNOSIS — Z8249 Family history of ischemic heart disease and other diseases of the circulatory system: Secondary | ICD-10-CM

## 2022-05-02 DIAGNOSIS — K625 Hemorrhage of anus and rectum: Secondary | ICD-10-CM | POA: Diagnosis not present

## 2022-05-02 DIAGNOSIS — J449 Chronic obstructive pulmonary disease, unspecified: Secondary | ICD-10-CM | POA: Diagnosis present

## 2022-05-02 DIAGNOSIS — Z79899 Other long term (current) drug therapy: Secondary | ICD-10-CM

## 2022-05-02 DIAGNOSIS — F32A Depression, unspecified: Secondary | ICD-10-CM | POA: Diagnosis present

## 2022-05-02 DIAGNOSIS — H3530 Unspecified macular degeneration: Secondary | ICD-10-CM | POA: Diagnosis not present

## 2022-05-02 DIAGNOSIS — Z96653 Presence of artificial knee joint, bilateral: Secondary | ICD-10-CM | POA: Diagnosis present

## 2022-05-02 DIAGNOSIS — M810 Age-related osteoporosis without current pathological fracture: Secondary | ICD-10-CM | POA: Diagnosis not present

## 2022-05-02 DIAGNOSIS — Z85828 Personal history of other malignant neoplasm of skin: Secondary | ICD-10-CM

## 2022-05-02 DIAGNOSIS — R197 Diarrhea, unspecified: Secondary | ICD-10-CM | POA: Diagnosis not present

## 2022-05-02 DIAGNOSIS — I959 Hypotension, unspecified: Secondary | ICD-10-CM | POA: Insufficient documentation

## 2022-05-02 DIAGNOSIS — K922 Gastrointestinal hemorrhage, unspecified: Secondary | ICD-10-CM

## 2022-05-02 DIAGNOSIS — I9589 Other hypotension: Secondary | ICD-10-CM

## 2022-05-02 DIAGNOSIS — I872 Venous insufficiency (chronic) (peripheral): Secondary | ICD-10-CM | POA: Diagnosis not present

## 2022-05-02 DIAGNOSIS — Z888 Allergy status to other drugs, medicaments and biological substances status: Secondary | ICD-10-CM

## 2022-05-02 DIAGNOSIS — E669 Obesity, unspecified: Secondary | ICD-10-CM | POA: Diagnosis present

## 2022-05-02 DIAGNOSIS — M1990 Unspecified osteoarthritis, unspecified site: Secondary | ICD-10-CM | POA: Diagnosis present

## 2022-05-02 DIAGNOSIS — Z881 Allergy status to other antibiotic agents status: Secondary | ICD-10-CM

## 2022-05-02 DIAGNOSIS — Z886 Allergy status to analgesic agent status: Secondary | ICD-10-CM

## 2022-05-02 DIAGNOSIS — K5731 Diverticulosis of large intestine without perforation or abscess with bleeding: Secondary | ICD-10-CM | POA: Diagnosis not present

## 2022-05-02 DIAGNOSIS — H9193 Unspecified hearing loss, bilateral: Secondary | ICD-10-CM | POA: Diagnosis present

## 2022-05-02 DIAGNOSIS — Z743 Need for continuous supervision: Secondary | ICD-10-CM | POA: Diagnosis not present

## 2022-05-02 DIAGNOSIS — H8110 Benign paroxysmal vertigo, unspecified ear: Secondary | ICD-10-CM | POA: Diagnosis present

## 2022-05-02 DIAGNOSIS — G2581 Restless legs syndrome: Secondary | ICD-10-CM | POA: Diagnosis not present

## 2022-05-02 DIAGNOSIS — H409 Unspecified glaucoma: Secondary | ICD-10-CM | POA: Diagnosis present

## 2022-05-02 DIAGNOSIS — Z87891 Personal history of nicotine dependence: Secondary | ICD-10-CM

## 2022-05-02 DIAGNOSIS — Z885 Allergy status to narcotic agent status: Secondary | ICD-10-CM

## 2022-05-02 DIAGNOSIS — E876 Hypokalemia: Secondary | ICD-10-CM | POA: Diagnosis present

## 2022-05-02 DIAGNOSIS — K219 Gastro-esophageal reflux disease without esophagitis: Secondary | ICD-10-CM | POA: Diagnosis present

## 2022-05-02 DIAGNOSIS — E785 Hyperlipidemia, unspecified: Secondary | ICD-10-CM | POA: Diagnosis present

## 2022-05-02 DIAGNOSIS — E861 Hypovolemia: Secondary | ICD-10-CM | POA: Diagnosis not present

## 2022-05-02 DIAGNOSIS — Z7983 Long term (current) use of bisphosphonates: Secondary | ICD-10-CM

## 2022-05-02 DIAGNOSIS — Z6832 Body mass index (BMI) 32.0-32.9, adult: Secondary | ICD-10-CM

## 2022-05-02 DIAGNOSIS — E559 Vitamin D deficiency, unspecified: Secondary | ICD-10-CM | POA: Diagnosis not present

## 2022-05-02 LAB — COMPREHENSIVE METABOLIC PANEL
ALT: 14 U/L (ref 0–44)
AST: 18 U/L (ref 15–41)
Albumin: 3.5 g/dL (ref 3.5–5.0)
Alkaline Phosphatase: 46 U/L (ref 38–126)
Anion gap: 6 (ref 5–15)
BUN: 24 mg/dL — ABNORMAL HIGH (ref 8–23)
CO2: 26 mmol/L (ref 22–32)
Calcium: 8.6 mg/dL — ABNORMAL LOW (ref 8.9–10.3)
Chloride: 107 mmol/L (ref 98–111)
Creatinine, Ser: 0.53 mg/dL (ref 0.44–1.00)
GFR, Estimated: >60 mL/min (ref >60)
Glucose, Bld: 129 mg/dL — ABNORMAL HIGH (ref 70–99)
Potassium: 3.8 mmol/L (ref 3.5–5.1)
Sodium: 139 mmol/L (ref 135–145)
Total Bilirubin: 0.4 mg/dL (ref 0.3–1.2)
Total Protein: 6.1 g/dL — ABNORMAL LOW (ref 6.5–8.1)

## 2022-05-02 LAB — CBC
HCT: 27.5 % — ABNORMAL LOW (ref 36.0–46.0)
Hemoglobin: 8.9 g/dL — ABNORMAL LOW (ref 12.0–15.0)
MCH: 31.3 pg (ref 26.0–34.0)
MCHC: 32.4 g/dL (ref 30.0–36.0)
MCV: 96.8 fL (ref 80.0–100.0)
Platelets: 151 10*3/uL (ref 150–400)
RBC: 2.84 MIL/uL — ABNORMAL LOW (ref 3.87–5.11)
RDW: 14.6 % (ref 11.5–15.5)
WBC: 6.4 10*3/uL (ref 4.0–10.5)
nRBC: 0 % (ref 0.0–0.2)

## 2022-05-02 LAB — HEMOGLOBIN AND HEMATOCRIT, BLOOD
HCT: 24.1 % — ABNORMAL LOW (ref 36.0–46.0)
Hemoglobin: 7.7 g/dL — ABNORMAL LOW (ref 12.0–15.0)

## 2022-05-02 MED ORDER — PANTOPRAZOLE SODIUM 40 MG PO TBEC
40.0000 mg | DELAYED_RELEASE_TABLET | Freq: Two times a day (BID) | ORAL | Status: DC
  Administered 2022-05-03 (×2): 40 mg via ORAL
  Filled 2022-05-02 (×2): qty 1

## 2022-05-02 MED ORDER — ACETAMINOPHEN 500 MG PO TABS
1000.0000 mg | ORAL_TABLET | Freq: Two times a day (BID) | ORAL | Status: DC
  Administered 2022-05-03 – 2022-05-07 (×9): 1000 mg via ORAL
  Filled 2022-05-02 (×9): qty 2

## 2022-05-02 MED ORDER — LATANOPROST 0.005 % OP SOLN
1.0000 [drp] | Freq: Every day | OPHTHALMIC | Status: DC
  Administered 2022-05-03 – 2022-05-06 (×4): 1 [drp] via OPHTHALMIC
  Filled 2022-05-02: qty 2.5

## 2022-05-02 MED ORDER — MIRABEGRON ER 25 MG PO TB24
25.0000 mg | ORAL_TABLET | Freq: Every day | ORAL | Status: DC
  Administered 2022-05-03 – 2022-05-07 (×5): 25 mg via ORAL
  Filled 2022-05-02 (×5): qty 1

## 2022-05-02 MED ORDER — ROPINIROLE HCL 1 MG PO TABS
0.5000 mg | ORAL_TABLET | Freq: Every evening | ORAL | Status: DC | PRN
  Administered 2022-05-05: 0.5 mg via ORAL
  Filled 2022-05-02: qty 1

## 2022-05-02 MED ORDER — SERTRALINE HCL 100 MG PO TABS
100.0000 mg | ORAL_TABLET | Freq: Every day | ORAL | Status: DC
  Administered 2022-05-03 – 2022-05-07 (×5): 100 mg via ORAL
  Filled 2022-05-02 (×5): qty 1

## 2022-05-02 MED ORDER — NETARSUDIL DIMESYLATE 0.02 % OP SOLN
1.0000 [drp] | Freq: Every day | OPHTHALMIC | Status: DC
  Administered 2022-05-06: 1 [drp] via OPHTHALMIC

## 2022-05-02 MED ORDER — FERROUS GLUCONATE 324 (38 FE) MG PO TABS
324.0000 mg | ORAL_TABLET | Freq: Every morning | ORAL | Status: DC
  Administered 2022-05-03 – 2022-05-07 (×5): 324 mg via ORAL
  Filled 2022-05-02 (×5): qty 1

## 2022-05-02 MED ORDER — LORATADINE 10 MG PO TABS
10.0000 mg | ORAL_TABLET | Freq: Every day | ORAL | Status: DC
  Administered 2022-05-03 – 2022-05-07 (×5): 10 mg via ORAL
  Filled 2022-05-02 (×5): qty 1

## 2022-05-02 MED ORDER — UMECLIDINIUM-VILANTEROL 62.5-25 MCG/ACT IN AEPB
1.0000 | INHALATION_SPRAY | Freq: Every day | RESPIRATORY_TRACT | Status: DC
  Administered 2022-05-05 – 2022-05-07 (×3): 1 via RESPIRATORY_TRACT
  Filled 2022-05-02: qty 14

## 2022-05-02 MED ORDER — ALBUTEROL SULFATE (2.5 MG/3ML) 0.083% IN NEBU: 2.5000 mg | INHALATION_SOLUTION | RESPIRATORY_TRACT | Status: DC | PRN

## 2022-05-02 MED ORDER — RISAQUAD PO CAPS
1.0000 | ORAL_CAPSULE | Freq: Every day | ORAL | Status: DC
  Administered 2022-05-03 – 2022-05-06 (×4): 1 via ORAL
  Filled 2022-05-02 (×4): qty 1

## 2022-05-02 MED ORDER — SIMVASTATIN 20 MG PO TABS
20.0000 mg | ORAL_TABLET | Freq: Every day | ORAL | Status: DC
  Administered 2022-05-03 – 2022-05-07 (×4): 20 mg via ORAL
  Filled 2022-05-02 (×2): qty 1
  Filled 2022-05-02: qty 2
  Filled 2022-05-02: qty 1
  Filled 2022-05-02: qty 2

## 2022-05-02 MED ORDER — ALBUTEROL SULFATE HFA 108 (90 BASE) MCG/ACT IN AERS: 2.0000 | INHALATION_SPRAY | RESPIRATORY_TRACT | Status: DC | PRN

## 2022-05-02 MED ORDER — VITAMIN C 500 MG PO TABS
1000.0000 mg | ORAL_TABLET | Freq: Every morning | ORAL | Status: DC
  Administered 2022-05-03 – 2022-05-07 (×5): 1000 mg via ORAL
  Filled 2022-05-02 (×5): qty 2

## 2022-05-02 MED ORDER — VITAMIN D 25 MCG (1000 UNIT) PO TABS
2000.0000 [IU] | ORAL_TABLET | Freq: Every morning | ORAL | Status: DC
  Administered 2022-05-03 – 2022-05-07 (×5): 2000 [IU] via ORAL
  Filled 2022-05-02 (×5): qty 2

## 2022-05-02 MED ORDER — DORZOLAMIDE HCL-TIMOLOL MAL 2-0.5 % OP SOLN
1.0000 [drp] | Freq: Two times a day (BID) | OPHTHALMIC | Status: DC
  Administered 2022-05-03 – 2022-05-07 (×7): 1 [drp] via OPHTHALMIC
  Filled 2022-05-02: qty 10

## 2022-05-02 MED ORDER — SODIUM CHLORIDE 0.9% IV SOLUTION: Freq: Once | INTRAVENOUS | Status: DC

## 2022-05-02 NOTE — H&P (Signed)
History and Physical    Patient: [name] QVZ:[id number] DOB: 03-20-1941 DOA: 05/02/2022 DOS: the patient was seen and examined on 05/02/2022 PCP: Yvonna Alanis, NP  Patient coming from: ALF/ILF  Chief Complaint:  Chief Complaint  Patient presents with   Rectal Bleeding   Weakness   HPI: [name] is a 81 y.o. 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  Patient reports having brisk rectal bleeding for the past 3 to 4 days when having a bowel movement.  Has felt increased abdominal distention but no pain.  No nausea or vomiting. She has history of diverticular bleed in 11/2020 s/p embolization with recurrence on 05/2021 that did not require intervention.  She was then admitted in 12/2021 requiring transfusion.  She underwent EGD with no active signs of bleeding.  She followed up with her PCP today and was noted to have downward trending hemoglobin of 11.2 yesterday down to 10.2 today with continuous rectal bleeding and was advised to present to the ED.  In the ED, she had borderline blood pressures in the 90s over 50s. Repeat hemoglobin of 8.9.  Creatinine of 0.53 with BUN of 24.  EP discussed with [facility] who will see in consultation in the morning. Review of Systems: As mentioned in the history of present illness. All other systems reviewed and are negative. Past Medical History:  Diagnosis Date   Anxiety    Arthritis    "bilateral knees, shoulders, elbows; neck, pretty widespread" (09/05/2017)   BPPV (benign paroxysmal positional vertigo)    Depression    GERD (gastroesophageal reflux disease)    Glaucoma, both eyes    Headache    "probably 2/month" (09/05/2017)   History of blood transfusion ~ 2008   "related to LGIB"   Hyperlipemia    Lower GI bleeding ~ 2008; 09/05/2017   "had to have blood transfusion"   Macular degeneration,  bilateral    Osteopenia    Seasonal allergies    Skin cancer, basal cell 2001   "off my nose, left side"   Sleeping excessive    Tinnitus of both ears    Past Surgical History:  Procedure Laterality Date   BALLOON DILATION N/A 05/08/2021   Procedure: BALLOON DILATION;  Surgeon: Ronnette Juniper, MD;  Location: [facility];  Service: Gastroenterology;  Laterality: N/A;   BASAL CELL CARCINOMA EXCISION  2001   "off my nose, left side"   BIOPSY  05/08/2021   Procedure: BIOPSY;  Surgeon: Ronnette Juniper, MD;  Location: [facility] ENDOSCOPY;  Service: Gastroenterology;;   BLEPHAROPLASTY Bilateral    CATARACT EXTRACTION W/ INTRAOCULAR LENS  IMPLANT, BILATERAL Bilateral 1990's   ESOPHAGOGASTRODUODENOSCOPY (EGD) WITH PROPOFOL N/A 05/08/2021   Procedure: ESOPHAGOGASTRODUODENOSCOPY (EGD) WITH PROPOFOL;  Surgeon: Ronnette Juniper, MD;  Location: [facility];  Service: Gastroenterology;  Laterality: N/A;   ESOPHAGOGASTRODUODENOSCOPY (EGD) WITH PROPOFOL N/A 01/11/2022   Procedure: ESOPHAGOGASTRODUODENOSCOPY (EGD) WITH PROPOFOL;  Surgeon: Clarene Essex, MD;  Location: [facility];  Service: Gastroenterology;  Laterality: N/A;   EYE SURGERY Bilateral    "to improve vision after cataract OR"   IR ANGIOGRAM FOLLOW UP STUDY  12/19/2020   IR ANGIOGRAM SELECTIVE EACH ADDITIONAL VESSEL  12/19/2020   IR ANGIOGRAM SELECTIVE EACH ADDITIONAL VESSEL  12/19/2020   IR ANGIOGRAM VISCERAL SELECTIVE  12/19/2020   IR EMBO ART  VEN HEMORR LYMPH EXTRAV  INC GUIDE ROADMAPPING  12/19/2020   IR US GUIDE  VASC ACCESS RIGHT  12/19/2020   JOINT REPLACEMENT     STAPEDES SURGERY Left    "scraped stapedes because it was sticking when it wasn't suppose to"   [place] Left ~ 2008   TOTAL KNEE ARTHROPLASTY  12/20/2011   Procedure: TOTAL KNEE ARTHROPLASTY;  Surgeon: Augustin Schooling, MD;  Location: [facility];  Service: Orthopedics;  Laterality: Right;  Right Total Knee Arthroplasty   TUBAL LIGATION  1980's    Social History:  reports that she quit smoking about 29 years ago. Her smoking use included cigarettes. She has a 27.75 pack-year smoking history. She has never used smokeless tobacco. She reports current alcohol use of about 7.0 standard drinks of alcohol per week. She reports that she does not use drugs.  Allergies  Allergen Reactions   Morphine And Related Itching   Abaloparatide Other (See Comments)    Unknown reaction   Aspirin Other (See Comments)    Unknown reaction   Duloxetine Hcl Other (See Comments)    Unknown reaction   Ventolin [Albuterol] Other (See Comments)    rapid heart beat   Cefadroxil Hives    Patient can take amoxicillin and cipro    Family History  Problem Relation Age of Onset   Heart attack Mother    Heart disease Father    Heart failure Father    Bladder Cancer Father    Supraventricular tachycardia Sister    Hypertension Sister     Prior to Admission medications   Medication Sig Start Date End Date Taking? Authorizing Provider  acetaminophen (TYLENOL) 500 MG tablet Take 1,000 mg by mouth 2 (two) times daily.   Yes [provider]  albuterol (VENTOLIN HFA) 108 (90 Base) MCG/ACT inhaler Inhale 2 puffs into the lungs every 4 (four) hours as needed for wheezing or shortness of breath.   Yes [provider]  alendronate (FOSAMAX) 70 MG tablet Take 70 mg by mouth every Sunday. Take with a full glass of water on an empty stomach.   Yes [provider]  Ascorbic Acid (VITAMIN C) 1000 MG tablet Take 1,000 mg by mouth every morning.   Yes [provider]  Cholecalciferol (VITAMIN D3) 50 MCG (2000 UT) TABS Take 2,000 Units by mouth every morning.   Yes [provider]  docusate sodium (COLACE) 100 MG capsule Take 200 mg by mouth at bedtime.   Yes [provider]  dorzolamide-timolol (COSOPT) 22.3-6.8 MG/ML ophthalmic solution Place 1 drop into both eyes 2 (two) times daily.   Yes [provider]   ferrous gluconate (FERGON) 240 (27 FE) MG tablet Take 240 mg by mouth every morning. Take without food   Yes [provider]  HYDROcodone-acetaminophen (NORCO/VICODIN) 5-325 MG tablet Take 1 tablet by mouth every 6 (six) hours as needed (pain). 12/25/20  Yes [provider]  hydrocortisone (ANUSOL-HC) 2.5 % rectal cream Place 1 Application rectally 2 (two) times daily for 7 days. 05/01/22 05/08/22 Yes [name], [name], NP  latanoprost (XALATAN) 0.005 % ophthalmic solution Place 1 drop into both eyes at bedtime. 02/13/18  Yes [provider]  loratadine (CLARITIN) 10 MG tablet Take 1 tablet (10 mg total) by mouth daily. 08/11/17  Yes Eugenie Filler, MD  Multiple Vitamins-Minerals (PRESERVISION AREDS 2) CAPS Take 1 capsule by mouth 2 (two) times daily.   Yes [provider]  MYRBETRIQ 25 MG TB24 tablet Take 25 mg by mouth daily. 04/18/22  Yes  [provider]  Netarsudil Dimesylate (RHOPRESSA) 0.02 % SOLN Place 1 drop into both eyes at bedtime.   Yes [provider]  pantoprazole (PROTONIX) 40 MG tablet Take 40 mg by mouth 2 (two) times daily.   Yes [provider]  polyethylene glycol (MIRALAX / GLYCOLAX) 17 g packet Take 8.5 g by mouth daily as needed (constipation).   Yes [provider]  Probiotic Product (ALIGN) 4 MG CAPS Take 4 mg by mouth at bedtime.   Yes [provider]  propranolol (INDERAL) 20 MG tablet Take 20 mg by mouth See admin instructions. Take one tablet (20 mg) by mouth daily at bedtime, may also take one tablet (20 mg) daily as needed for pulse over 120 04/30/21  Yes [provider]  rOPINIRole (REQUIP) 1 MG tablet Take 0.5 mg by mouth at bedtime as needed (restless legs/tremors).   Yes [provider]  sertraline (ZOLOFT) 100 MG tablet Take 1 tablet (100 mg total) by mouth daily. 12/23/20  Yes Nita Sells, MD  simvastatin (ZOCOR) 20 MG tablet Take 20 mg by mouth daily.   Yes  [provider]  Sodium Fluoride (PREVIDENT 5000 BOOSTER PLUS) 1.1 % PSTE Place 1 Application onto teeth See admin instructions. Brush on teeth with a toothbrush after evening mouth care. Spit out excess and do not rinse.   Yes [provider]  umeclidinium-vilanterol (ANORO ELLIPTA) 62.5-25 MCG/ACT AEPB Inhale 1 puff into the lungs every morning.   Yes [provider]  zinc oxide 20 % ointment Apply 1 Application topically See admin instructions. Apply topically to buttocks after every incontinent episode and as needed for redness   Yes [provider]  OXYGEN 2lpm with sleep only  [facility]    [provider]    Physical Exam: Vitals:   05/02/22 2145 05/02/22 2231 05/02/22 2300 05/02/22 2330  BP:  (!) 112/48 (!) 92/57 (!) 97/52  Pulse: 83 83    Resp: 16 (!) '23 18 19  '$ Temp:  97.9 F (36.6 C)    TempSrc:  Oral    SpO2: 96% 98% 97% 98%   Constitutional: NAD, calm, comfortable, elderly female sitting upright in bed Eyes:  lids and conjunctivae normal ENMT: Mucous membranes are moist. Neck: normal, supple Respiratory: clear to auscultation bilaterally, no wheezing, no crackles. Normal respiratory effort. No accessory muscle use.  Cardiovascular: Regular rate and rhythm, no murmurs / rubs / gallops. No extremity edema.  Abdomen: Soft, nontender moderately distended. Bowel sounds positive.  GU: No active rectal bleeding on exam Musculoskeletal: no clubbing / cyanosis. No joint deformity upper and lower extremities. Good ROM, no contractures. Normal muscle tone.  Skin: no rashes, lesions, ulcers.  Neurologic: CN 2-12 grossly intact.  Strength 5/5 in all 4.  Psychiatric: Normal judgment and insight. Alert and oriented x 3. Normal mood. Data Reviewed:  See HPI   Assessment and Plan: * Acute GI bleeding -history of diverticular bleed in 11/2020 s/p embolization with recurrence on 05/2021 that did not require intervention.  She was then admitted in  12/2021 with recurrent GI bleed requiring transfusion.  She underwent EGD with no active signs of bleeding. -Hgb has downward trend from 11.2 (11/1) --> 10.2 --> 8.9 on presentation -->7.7.  -Given how rapid she has dropped, obtain stat CT Angio -transfuse 1u pRBC with 2 ahead. Follow post H/H. Trend q2hrs. [facility] aware and will see in consultation tomorrow  Hypotension Borderline BP with SBP in the 90s -Hold home antihypertensives -receiving  1u PRBC  Restless leg syndrome Continue home meds  COPD GOLD II  Stable.  Continue home bronchodilator - Continue nocturnal O2 2 L      Advance Care Planning:   Code Status: Full Code   Consults: [facility]  Family Communication: None at bedside  Severity of Illness: The appropriate patient status for this patient is OBSERVATION. Observation status is judged to be reasonable and necessary in order to provide the required intensity of service to ensure the patient's safety. The patient's presenting symptoms, physical exam findings, and initial radiographic and laboratory data in the context of their medical condition is felt to place them at decreased risk for further clinical deterioration. Furthermore, it is anticipated that the patient will be medically stable for discharge from the hospital within 2 midnights of admission.   Author: Orene Desanctis, DO 05/02/2022 11:47 PM  For on call review [email or web address].

## 2022-05-02 NOTE — ED Triage Notes (Signed)
Patient BIB [facility] from [facility]. Patient has a history of GI bleeds. Had bright red blood in her stool today. Her hemoglobin went down from 11 to 10 in 1 day.

## 2022-05-02 NOTE — Assessment & Plan Note (Addendum)
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  She was then admitted in 12/2021 with recurrent GI bleed requiring transfusion.  She underwent EGD with no active signs of bleeding. -Hgb has downward trend from 11.2 (11/1) --> 10.2 --> 8.9 on presentation -->7.7.  -Given how rapid she has dropped, obtain stat CT Angio -transfuse 1u pRBC with 2 ahead. Follow post H/H. Trend q2hrs. [facility] aware and will see in consultation tomorrow

## 2022-05-02 NOTE — Progress Notes (Signed)
Location: [place] of Service:  ALF [phone])  Provider:   Code Status:  Goals of Care:     04/18/2022    2:38 PM  Advanced Directives  Does Patient Have a Medical Advance Directive? No  Does patient want to make changes to medical advance directive? No - Patient declined     Chief Complaint  Patient presents with   Acute Visit    HPI: Patient is a 81 y.o. 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  Follows with [facility]  Started yesterday with BRPR Stat CBC showed HGB of 11.2  But since then she keeps on bleeding and now dropped to 10.2 First she had refused to go to ED but now she has had 3 more episodes of Bleeding Just Red blood It is painless bleeding No Nausea or vomiting   Past Medical History:  Diagnosis Date   Anxiety    Arthritis    "bilateral knees, shoulders, elbows; neck, pretty widespread" (09/05/2017)   BPPV (benign paroxysmal positional vertigo)    Depression    GERD (gastroesophageal reflux disease)    Glaucoma, both eyes    Headache    "probably 2/month" (09/05/2017)   History of blood transfusion ~ 2008   "related to LGIB"   Hyperlipemia    Lower GI bleeding ~ 2008; 09/05/2017   "had to have blood transfusion"   Macular degeneration, bilateral    Osteopenia    Seasonal allergies    Skin cancer, basal cell 2001   "off my nose, left side"   Sleeping excessive    Tinnitus of both ears     Past Surgical History:  Procedure Laterality Date   BALLOON DILATION N/A 05/08/2021   Procedure: BALLOON DILATION;  Surgeon: Ronnette Juniper, MD;  Location: [place];  Service: Gastroenterology;  Laterality: N/A;   BASAL CELL CARCINOMA EXCISION  2001   "off my nose, left side"   BIOPSY  05/08/2021   Procedure:  BIOPSY;  Surgeon: Ronnette Juniper, MD;  Location: [facility] ENDOSCOPY;  Service: Gastroenterology;;   BLEPHAROPLASTY Bilateral    CATARACT EXTRACTION W/ INTRAOCULAR LENS  IMPLANT, BILATERAL Bilateral 1990's   ESOPHAGOGASTRODUODENOSCOPY (EGD) WITH PROPOFOL N/A 05/08/2021   Procedure: ESOPHAGOGASTRODUODENOSCOPY (EGD) WITH PROPOFOL;  Surgeon: Ronnette Juniper, MD;  Location: [place];  Service: Gastroenterology;  Laterality: N/A;   ESOPHAGOGASTRODUODENOSCOPY (EGD) WITH PROPOFOL N/A 01/11/2022   Procedure: ESOPHAGOGASTRODUODENOSCOPY (EGD) WITH PROPOFOL;  Surgeon: Clarene Essex, MD;  Location: [place];  Service: Gastroenterology;  Laterality: N/A;   EYE SURGERY Bilateral    "to improve vision after cataract OR"   IR ANGIOGRAM FOLLOW UP STUDY  12/19/2020   IR ANGIOGRAM SELECTIVE EACH ADDITIONAL VESSEL  12/19/2020   IR ANGIOGRAM SELECTIVE EACH ADDITIONAL VESSEL  12/19/2020   IR ANGIOGRAM VISCERAL SELECTIVE  12/19/2020   IR EMBO ART  VEN HEMORR LYMPH EXTRAV  INC GUIDE ROADMAPPING  12/19/2020   IR US GUIDE VASC ACCESS RIGHT  12/19/2020   JOINT REPLACEMENT     STAPEDES SURGERY Left    "scraped stapedes because it was sticking when it wasn't suppose to"   [place] Left ~ 2008   TOTAL KNEE ARTHROPLASTY  12/20/2011  Procedure: TOTAL KNEE ARTHROPLASTY;  Surgeon: Augustin Schooling, MD;  Location: [place];  Service: Orthopedics;  Laterality: Right;  Right Total Knee Arthroplasty   TUBAL LIGATION  1980's    Allergies  Allergen Reactions   Morphine And Related Itching   Abaloparatide Other (See Comments)    Unknown reaction   Aspirin Other (See Comments)    Unknown reaction   Duloxetine Hcl Other (See Comments)    Unknown reaction   Ventolin [Albuterol] Other (See Comments)    rapid heart beat   Cefadroxil Hives    Patient can take amoxicillin and cipro    Outpatient Encounter Medications as of 05/02/2022  Medication Sig   acetaminophen (TYLENOL) 500 MG tablet  Take 1,000 mg by mouth 2 (two) times daily.   albuterol (VENTOLIN HFA) 108 (90 Base) MCG/ACT inhaler Inhale 2 puffs into the lungs every 4 (four) hours as needed for wheezing or shortness of breath.   alendronate (FOSAMAX) 70 MG tablet Take 70 mg by mouth every Sunday. Take with a full glass of water on an empty stomach.   Ascorbic Acid (VITAMIN C) 1000 MG tablet Take 1,000 mg by mouth every morning.   Cholecalciferol (VITAMIN D3) 50 MCG (2000 UT) TABS Take 2,000 Units by mouth every morning.   docusate sodium (COLACE) 100 MG capsule Take 200 mg by mouth at bedtime.   dorzolamide-timolol (COSOPT) 22.3-6.8 MG/ML ophthalmic solution Place 1 drop into both eyes 2 (two) times daily.   ferrous gluconate (FERGON) 240 (27 FE) MG tablet Take 240 mg by mouth every morning. Take without food   HYDROcodone-acetaminophen (NORCO/VICODIN) 5-325 MG tablet Take 1 tablet by mouth every 6 (six) hours as needed (pain).   hydrocortisone (ANUSOL-HC) 2.5 % rectal cream Place 1 Application rectally 2 (two) times daily for 7 days.   latanoprost (XALATAN) 0.005 % ophthalmic solution Place 1 drop into both eyes at bedtime.   loratadine (CLARITIN) 10 MG tablet Take 1 tablet (10 mg total) by mouth daily.   Multiple Vitamin-Folic Acid TABS Take 1 tablet by mouth every morning. Multivitamin with 741 mcg folic acid   Multiple Vitamins-Minerals (PRESERVISION AREDS 2) CAPS Take 1 capsule by mouth 2 (two) times daily.   Netarsudil Dimesylate (RHOPRESSA) 0.02 % SOLN Place 1 drop into both eyes at bedtime.   OXYGEN 2lpm with sleep only  [employer]   pantoprazole (PROTONIX) 40 MG tablet Take 40 mg by mouth 2 (two) times daily.   polyethylene glycol (MIRALAX / GLYCOLAX) 17 g packet Take 8.5 g by mouth daily as needed (constipation).   Probiotic Product (ALIGN) 4 MG CAPS Take 4 mg by mouth at bedtime.   propranolol (INDERAL) 20 MG tablet Take 20 mg by mouth See admin instructions. Take one tablet (20 mg) by mouth daily at bedtime, may  also take one tablet (20 mg) daily as needed for pulse over 120   rOPINIRole (REQUIP) 1 MG tablet Take 0.5 mg by mouth at bedtime as needed (restless legs/tremors).   sertraline (ZOLOFT) 100 MG tablet Take 1 tablet (100 mg total) by mouth daily.   simvastatin (ZOCOR) 20 MG tablet Take 20 mg by mouth every evening.   Sodium Fluoride (PREVIDENT 5000 BOOSTER PLUS) 1.1 % PSTE Place 1 Application onto teeth See admin instructions. Brush on teeth with a toothbrush after evening mouth care. Spit out excess and do not rinse.   umeclidinium-vilanterol (ANORO ELLIPTA) 62.5-25 MCG/ACT AEPB Inhale 1 puff into the lungs every morning.   zinc oxide 20 % ointment  Apply 1 Application topically See admin instructions. Apply topically to buttocks after every incontinent episode and as needed for redness   No facility-administered encounter medications on file as of 05/02/2022.    Review of Systems:  Review of Systems  Constitutional:  Negative for activity change and appetite change.  HENT: Negative.    Respiratory:  Negative for cough and shortness of breath.   Cardiovascular:  Negative for leg swelling.  Gastrointestinal:  Positive for blood in stool. Negative for constipation.  Genitourinary: Negative.   Musculoskeletal:  Negative for arthralgias, gait problem and myalgias.  Skin: Negative.   Neurological:  Negative for dizziness and weakness.  Psychiatric/Behavioral:  Negative for confusion, dysphoric mood and sleep disturbance.     Health Maintenance  Topic Date Due   Pneumonia Vaccine 93+ Years old (1 - PCV) 11/14/2003   DEXA SCAN  Never done   INFLUENZA VACCINE  01/29/2022   COVID-19 Vaccine (4 - Pfizer series) 02/06/2022   Medicare Annual Wellness (AWV)  03/20/2023   TETANUS/TDAP  03/12/2028   Zoster Vaccines- Shingrix  Completed   HPV VACCINES  Aged Out    Physical Exam: Vitals:   05/02/22 1600  BP: (!) 142/78  Pulse: 82  Resp: 16  Temp: 97.9 F (36.6 C)   There is no height or  weight on file to calculate BMI. Physical Exam  Labs reviewed: Basic Metabolic Panel: Recent Labs    05/03/21 0048 05/04/21 1817 05/05/21 0009 05/05/21 0802 01/11/22 1005 01/11/22 2146 01/12/22 0653 01/13/22 0035 01/14/22 0309  NA 137   < > 138   < >  --    < > 142 141 141  K 3.6   < > 3.9   < >  --    < > 3.8 3.7 3.8  CL 104   < > 99   < >  --    < > 116* 110 110  CO2 29   < > 30   < >  --    < > 20* 23 26  GLUCOSE 106*   < > 106*   < >  --    < > 116* 123* 125*  BUN <5*   < > 7*   < >  --    < > '15 10 11  '$ CREATININE 0.46   < > 0.57   < >  --    < > 0.48 0.59 0.58  CALCIUM 8.4*   < > 8.6*   < >  --    < > 7.8* 8.5* 8.5*  MG 2.1  --  2.0   < > 2.0  --   --  1.9 2.1  PHOS 3.7  --  3.2  --   --   --   --   --   --    < > = values in this interval not displayed.   Liver Function Tests: Recent Labs    01/07/22 1950 01/08/22 0500 01/11/22 2146  AST '19 16 23  '$ ALT '14 13 15  '$ ALKPHOS 48 36* 32*  BILITOT 0.4 0.4 0.4  PROT 6.0* 5.0* 4.5*  ALBUMIN 3.5 3.0* 2.6*   No results for input(s): "LIPASE", "AMYLASE" in the last 8760 hours. No results for input(s): "AMMONIA" in the last 8760 hours. CBC: Recent Labs    01/12/22 0653 01/13/22 0035 01/14/22 0309 02/18/22 0000  WBC 5.7 6.7 6.6 4.5  NEUTROABS  --  3.9 3.7 2,358.00  HGB 7.8* 7.4* 8.9* 11.3*  HCT  23.5* 22.3* 27.9* 34*  MCV 94.8 92.5 95.2  --   PLT 127* 138* 151 158   Lipid Panel: Recent Labs    02/18/22 0000  CHOL 147  HDL 51  LDLCALC 72  TRIG 162*   No results found for: "HGBA1C"  Procedures since last visit: No results found.  Assessment/Plan 1. Acute GI bleeding She continues to have Lower GI bleed  Hgb dropped to 10.2. Has had 3 more Episodes since then  Has h/o Diverticular bleed Will send her to ED Husband has agreed      Labs/tests ordered:  * No order type specified * Next appt:  Visit date not found

## 2022-05-02 NOTE — ED Provider Notes (Signed)
[facility] DEPT Provider Note   CSN: [id number] Arrival date & time: 05/02/22  1742     History Chief Complaint  Patient presents with   Rectal Bleeding   Weakness    HPI [name] is a 81 y.o. female presenting for chief complaint of bright red blood per rectum.  Has a history of extensive upper and lower GI bleeds.  However she states that over the last 3 days she has had approximately 3 episodes per day of bright red blood in her bowel movements.  Her diapers currently coated in bright red blood.  She endorses lightheadedness.  She has been trying to not have to come back to the emergency department because of her history of GI bleed she was hoping this will not resolve. Unfortunately she is continuing to be symptomatic. Last admitted earlier this year for similar required endoscopy by Dr. Watt Climes with [facility] physicians. Patient denies fevers or chills, nausea vomiting, syncope or shortness of breath at this time.  Patient's recorded medical, surgical, social, medication list and allergies were reviewed in the Snapshot window as part of the initial history.   Review of Systems   Review of Systems  Constitutional:  Negative for chills and fever.  HENT:  Negative for ear pain and sore throat.   Eyes:  Negative for pain and visual disturbance.  Respiratory:  Negative for cough and shortness of breath.   Cardiovascular:  Negative for chest pain and palpitations.  Gastrointestinal:  Positive for blood in stool and diarrhea. Negative for abdominal pain and vomiting.  Genitourinary:  Negative for dysuria and hematuria.  Musculoskeletal:  Negative for arthralgias and back pain.  Skin:  Negative for color change and rash.  Neurological:  Negative for seizures and syncope.  All other systems reviewed and are negative.   Physical Exam Updated Vital Signs BP (!) 112/48   Pulse 83   Temp 97.9 F (36.6 C) (Oral)   Resp (!) 23   SpO2 98%  Physical  Exam Vitals and nursing note reviewed. Exam conducted with a chaperone present.  Constitutional:      General: She is not in acute distress.    Appearance: She is well-developed.  HENT:     Head: Normocephalic and atraumatic.  Eyes:     Conjunctiva/sclera: Conjunctivae normal.  Cardiovascular:     Rate and Rhythm: Normal rate and regular rhythm.     Heart sounds: No murmur heard. Pulmonary:     Effort: Pulmonary effort is normal. No respiratory distress.     Breath sounds: Normal breath sounds.  Abdominal:     General: There is no distension.     Palpations: Abdomen is soft.     Tenderness: There is no abdominal tenderness. There is no right CVA tenderness or left CVA tenderness.  Musculoskeletal:        General: No swelling or tenderness. Normal range of motion.     Cervical back: Neck supple.  Skin:    General: Skin is warm and dry.  Neurological:     General: No focal deficit present.     Mental Status: She is alert and oriented to person, place, and time. Mental status is at baseline.     Cranial Nerves: No cranial nerve deficit.   Bright red blood demonstrated.  Patient cleaned and further bright red bloodCame out.   ED Course/ Medical Decision Making/ A&P Clinical Course as of 05/02/22 2312  Thu May 02, 2022  [address] [facility] consult [CC]  Clinical Course User Index [CC] Tretha Sciara, MD    Procedures Procedures   Medications Ordered in ED Medications - No data to display  Medical Decision Making:    [name] is a 81 y.o. female who presented to the ED today with bright red blood per rectum detailed above.      Complete initial physical exam performed, notably the patient  was hemodynamically stable in no acute distress.  Obvious bright red blood on her diaper.      Reviewed and confirmed nursing documentation for past medical history, family history, social history.    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  Favor likely lower GI bleed based on continued high by red blood per rectum.This is most consistent with an acute life/limb threatening illness complicated by underlying chronic conditions.  Initial Plan:  Screening labs including CBC and Metabolic panel to evaluate for infectious or metabolic etiology of disease.  Consultation with gastroenterology. Objective evaluation as below reviewed with plan for close reassessment  Initial Study Results:   Laboratory  All laboratory results reviewed without evidence of clinically relevant pathology.   Exceptions include: Hemoglobin drop from 11-8   Consults:  Case discussed with [facility] gastroenterology.   Final Assessment and Plan:   Given hemoglobin drop, bright blood per rectum, hospitalist consulted for ongoing care and management.  Agreed with need for admission and patient admitted with no further acute events.   Disposition:   Based on the above findings, I believe this patient is stable for admission.    Patient/family educated about specific findings on our evaluation and explained exact reasons for admission.  Patient/family educated about clinical situation and time was allowed to answer questions.   Admission team communicated with and agreed with need for admission. Patient admitted. Patient ready to move at this time.     Emergency Department Medication Summary:   Medications - No data to display       Clinical Impression:  1. Rectal bleeding      Admit   Final Clinical Impression(s) / ED Diagnoses Final diagnoses:  Rectal bleeding    Rx / DC Orders ED Discharge Orders     None         Tretha Sciara, MD 05/02/22 2312

## 2022-05-02 NOTE — Assessment & Plan Note (Signed)
-   Continue home meds °

## 2022-05-02 NOTE — Assessment & Plan Note (Signed)
Borderline BP with SBP in the 90s -Hold home antihypertensives -receiving 1u PRBC

## 2022-05-02 NOTE — Assessment & Plan Note (Signed)
Stable.  Continue home bronchodilator - Continue nocturnal O2 2 L

## 2022-05-03 ENCOUNTER — Observation Stay (HOSPITAL_COMMUNITY): Payer: Medicare HMO

## 2022-05-03 DIAGNOSIS — Z79899 Other long term (current) drug therapy: Secondary | ICD-10-CM | POA: Diagnosis not present

## 2022-05-03 DIAGNOSIS — K922 Gastrointestinal hemorrhage, unspecified: Secondary | ICD-10-CM | POA: Diagnosis not present

## 2022-05-03 DIAGNOSIS — K635 Polyp of colon: Secondary | ICD-10-CM | POA: Diagnosis not present

## 2022-05-03 DIAGNOSIS — E669 Obesity, unspecified: Secondary | ICD-10-CM | POA: Diagnosis not present

## 2022-05-03 DIAGNOSIS — E785 Hyperlipidemia, unspecified: Secondary | ICD-10-CM | POA: Diagnosis not present

## 2022-05-03 DIAGNOSIS — G2581 Restless legs syndrome: Secondary | ICD-10-CM | POA: Diagnosis not present

## 2022-05-03 DIAGNOSIS — F419 Anxiety disorder, unspecified: Secondary | ICD-10-CM | POA: Diagnosis not present

## 2022-05-03 DIAGNOSIS — E876 Hypokalemia: Secondary | ICD-10-CM | POA: Diagnosis not present

## 2022-05-03 DIAGNOSIS — D123 Benign neoplasm of transverse colon: Secondary | ICD-10-CM | POA: Diagnosis not present

## 2022-05-03 DIAGNOSIS — K219 Gastro-esophageal reflux disease without esophagitis: Secondary | ICD-10-CM | POA: Diagnosis not present

## 2022-05-03 DIAGNOSIS — R69 Illness, unspecified: Secondary | ICD-10-CM | POA: Diagnosis not present

## 2022-05-03 DIAGNOSIS — I10 Essential (primary) hypertension: Secondary | ICD-10-CM | POA: Diagnosis not present

## 2022-05-03 DIAGNOSIS — J449 Chronic obstructive pulmonary disease, unspecified: Secondary | ICD-10-CM | POA: Diagnosis not present

## 2022-05-03 DIAGNOSIS — E861 Hypovolemia: Secondary | ICD-10-CM | POA: Diagnosis not present

## 2022-05-03 DIAGNOSIS — M1990 Unspecified osteoarthritis, unspecified site: Secondary | ICD-10-CM | POA: Diagnosis not present

## 2022-05-03 DIAGNOSIS — I9589 Other hypotension: Secondary | ICD-10-CM | POA: Diagnosis not present

## 2022-05-03 DIAGNOSIS — K921 Melena: Secondary | ICD-10-CM | POA: Diagnosis not present

## 2022-05-03 DIAGNOSIS — N2889 Other specified disorders of kidney and ureter: Secondary | ICD-10-CM | POA: Diagnosis not present

## 2022-05-03 DIAGNOSIS — H9193 Unspecified hearing loss, bilateral: Secondary | ICD-10-CM | POA: Diagnosis not present

## 2022-05-03 DIAGNOSIS — R933 Abnormal findings on diagnostic imaging of other parts of digestive tract: Secondary | ICD-10-CM | POA: Diagnosis not present

## 2022-05-03 DIAGNOSIS — K5730 Diverticulosis of large intestine without perforation or abscess without bleeding: Secondary | ICD-10-CM | POA: Diagnosis not present

## 2022-05-03 DIAGNOSIS — Z87891 Personal history of nicotine dependence: Secondary | ICD-10-CM | POA: Diagnosis not present

## 2022-05-03 DIAGNOSIS — N281 Cyst of kidney, acquired: Secondary | ICD-10-CM | POA: Diagnosis not present

## 2022-05-03 DIAGNOSIS — Z961 Presence of intraocular lens: Secondary | ICD-10-CM | POA: Diagnosis not present

## 2022-05-03 DIAGNOSIS — J302 Other seasonal allergic rhinitis: Secondary | ICD-10-CM | POA: Diagnosis not present

## 2022-05-03 DIAGNOSIS — K625 Hemorrhage of anus and rectum: Secondary | ICD-10-CM | POA: Diagnosis not present

## 2022-05-03 DIAGNOSIS — I959 Hypotension, unspecified: Secondary | ICD-10-CM | POA: Diagnosis not present

## 2022-05-03 DIAGNOSIS — Z85828 Personal history of other malignant neoplasm of skin: Secondary | ICD-10-CM | POA: Diagnosis not present

## 2022-05-03 DIAGNOSIS — Z96653 Presence of artificial knee joint, bilateral: Secondary | ICD-10-CM | POA: Diagnosis not present

## 2022-05-03 DIAGNOSIS — I701 Atherosclerosis of renal artery: Secondary | ICD-10-CM | POA: Diagnosis not present

## 2022-05-03 DIAGNOSIS — H409 Unspecified glaucoma: Secondary | ICD-10-CM | POA: Diagnosis not present

## 2022-05-03 DIAGNOSIS — D62 Acute posthemorrhagic anemia: Secondary | ICD-10-CM | POA: Diagnosis not present

## 2022-05-03 DIAGNOSIS — F32A Depression, unspecified: Secondary | ICD-10-CM | POA: Diagnosis not present

## 2022-05-03 DIAGNOSIS — H8110 Benign paroxysmal vertigo, unspecified ear: Secondary | ICD-10-CM | POA: Diagnosis not present

## 2022-05-03 DIAGNOSIS — Z6832 Body mass index (BMI) 32.0-32.9, adult: Secondary | ICD-10-CM | POA: Diagnosis not present

## 2022-05-03 DIAGNOSIS — I700 Atherosclerosis of aorta: Secondary | ICD-10-CM | POA: Diagnosis not present

## 2022-05-03 DIAGNOSIS — K5731 Diverticulosis of large intestine without perforation or abscess with bleeding: Secondary | ICD-10-CM | POA: Diagnosis not present

## 2022-05-03 LAB — PREPARE RBC (CROSSMATCH)

## 2022-05-03 LAB — RETICULOCYTES
Immature Retic Fract: 14.4 % (ref 2.3–15.9)
RBC.: 2.45 MIL/uL — ABNORMAL LOW (ref 3.87–5.11)
Retic Count, Absolute: 52.7 10*3/uL (ref 19.0–186.0)
Retic Ct Pct: 2.2 % (ref 0.4–3.1)

## 2022-05-03 LAB — BASIC METABOLIC PANEL
Anion gap: 5 (ref 5–15)
Anion gap: 5 (ref 5–15)
BUN: 24 mg/dL — ABNORMAL HIGH (ref 8–23)
BUN: 20 mg/dL (ref 8–23)
CO2: 24 mmol/L (ref 22–32)
CO2: 22 mmol/L (ref 22–32)
Calcium: 7.8 mg/dL — ABNORMAL LOW (ref 8.9–10.3)
Calcium: 7.6 mg/dL — ABNORMAL LOW (ref 8.9–10.3)
Chloride: 111 mmol/L (ref 98–111)
Chloride: 111 mmol/L (ref 98–111)
Creatinine, Ser: 0.32 mg/dL — ABNORMAL LOW (ref 0.44–1.00)
Creatinine, Ser: 0.32 mg/dL — ABNORMAL LOW (ref 0.44–1.00)
GFR, Estimated: >60 mL/min (ref >60)
GFR, Estimated: >60 mL/min (ref >60)
Glucose, Bld: 114 mg/dL — ABNORMAL HIGH (ref 70–99)
Glucose, Bld: 110 mg/dL — ABNORMAL HIGH (ref 70–99)
Potassium: 3.6 mmol/L (ref 3.5–5.1)
Potassium: 3.6 mmol/L (ref 3.5–5.1)
Sodium: 140 mmol/L (ref 135–145)
Sodium: 138 mmol/L (ref 135–145)

## 2022-05-03 LAB — CBC
HCT: 23.9 % — ABNORMAL LOW (ref 36.0–46.0)
Hemoglobin: 8 g/dL — ABNORMAL LOW (ref 12.0–15.0)
MCH: 32 pg (ref 26.0–34.0)
MCHC: 33.5 g/dL (ref 30.0–36.0)
MCV: 95.6 fL (ref 80.0–100.0)
Platelets: 116 10*3/uL — ABNORMAL LOW (ref 150–400)
RBC: 2.5 MIL/uL — ABNORMAL LOW (ref 3.87–5.11)
RDW: 14.7 % (ref 11.5–15.5)
WBC: 5.9 10*3/uL (ref 4.0–10.5)
nRBC: 0 % (ref 0.0–0.2)

## 2022-05-03 LAB — HEMOGLOBIN AND HEMATOCRIT, BLOOD
HCT: 22.4 % — ABNORMAL LOW (ref 36.0–46.0)
HCT: 23.3 % — ABNORMAL LOW (ref 36.0–46.0)
Hemoglobin: 7.1 g/dL — ABNORMAL LOW (ref 12.0–15.0)
Hemoglobin: 7.4 g/dL — ABNORMAL LOW (ref 12.0–15.0)

## 2022-05-03 LAB — IRON AND TIBC
Iron: 143 ug/dL (ref 28–170)
Saturation Ratios: 56 % — ABNORMAL HIGH (ref 10.4–31.8)
TIBC: 258 ug/dL (ref 250–450)
UIBC: 115 ug/dL

## 2022-05-03 LAB — CBC WITH DIFFERENTIAL/PLATELET
Abs Immature Granulocytes: 0.03 10*3/uL (ref 0.00–0.07)
Basophils Absolute: 0 10*3/uL (ref 0.0–0.1)
Basophils Relative: 0 %
Eosinophils Absolute: 0.1 10*3/uL (ref 0.0–0.5)
Eosinophils Relative: 2 %
HCT: 23.8 % — ABNORMAL LOW (ref 36.0–46.0)
Hemoglobin: 7.7 g/dL — ABNORMAL LOW (ref 12.0–15.0)
Immature Granulocytes: 0 %
Lymphocytes Relative: 14 %
Lymphs Abs: 1 10*3/uL (ref 0.7–4.0)
MCH: 29.7 pg (ref 26.0–34.0)
MCHC: 32.4 g/dL (ref 30.0–36.0)
MCV: 91.9 fL (ref 80.0–100.0)
Monocytes Absolute: 0.8 10*3/uL (ref 0.1–1.0)
Monocytes Relative: 11 %
Neutro Abs: 5.1 10*3/uL (ref 1.7–7.7)
Neutrophils Relative %: 73 %
Platelets: 106 10*3/uL — ABNORMAL LOW (ref 150–400)
RBC: 2.59 MIL/uL — ABNORMAL LOW (ref 3.87–5.11)
RDW: 17.7 % — ABNORMAL HIGH (ref 11.5–15.5)
WBC: 7.1 10*3/uL (ref 4.0–10.5)
nRBC: 0 % (ref 0.0–0.2)

## 2022-05-03 LAB — POC OCCULT BLOOD, ED: Fecal Occult Bld: POSITIVE — AB

## 2022-05-03 LAB — MRSA NEXT GEN BY PCR, NASAL: MRSA by PCR Next Gen: NOT DETECTED

## 2022-05-03 LAB — FOLATE: Folate: 28.7 ng/mL (ref >5.9)

## 2022-05-03 LAB — VITAMIN B12: Vitamin B-12: 412 pg/mL (ref 180–914)

## 2022-05-03 LAB — FERRITIN: Ferritin: 34 ng/mL (ref 11–307)

## 2022-05-03 MED ORDER — LACTATED RINGERS IV BOLUS
500.0000 mL | Freq: Once | INTRAVENOUS | Status: AC
  Administered 2022-05-03: 500 mL via INTRAVENOUS

## 2022-05-03 MED ORDER — SODIUM CHLORIDE 0.9% IV SOLUTION: Freq: Once | INTRAVENOUS | Status: AC

## 2022-05-03 MED ORDER — IOHEXOL 350 MG/ML SOLN
100.0000 mL | Freq: Once | INTRAVENOUS | Status: AC | PRN
  Administered 2022-05-03: 100 mL via INTRAVENOUS

## 2022-05-03 MED ORDER — LACTATED RINGERS IV SOLN: INTRAVENOUS | Status: DC

## 2022-05-03 MED ORDER — PANTOPRAZOLE SODIUM 40 MG IV SOLR
40.0000 mg | Freq: Two times a day (BID) | INTRAVENOUS | Status: DC
  Administered 2022-05-04 – 2022-05-06 (×6): 40 mg via INTRAVENOUS
  Filled 2022-05-03 (×6): qty 10

## 2022-05-03 MED ORDER — CHLORHEXIDINE GLUCONATE CLOTH 2 % EX PADS
6.0000 | MEDICATED_PAD | Freq: Every day | CUTANEOUS | Status: DC
  Administered 2022-05-04: 6 via TOPICAL

## 2022-05-03 MED ORDER — SODIUM CHLORIDE 0.9% IV SOLUTION: Freq: Once | INTRAVENOUS | Status: DC

## 2022-05-03 MED ORDER — SODIUM CHLORIDE (PF) 0.9 % IJ SOLN
INTRAMUSCULAR | Status: AC
  Filled 2022-05-03: qty 50

## 2022-05-03 MED ORDER — TECHNETIUM TC 99M-LABELED RED BLOOD CELLS IV KIT
20.5000 | PACK | Freq: Once | INTRAVENOUS | Status: AC
  Administered 2022-05-03: 20.5 via INTRAVENOUS

## 2022-05-03 MED ORDER — SODIUM CHLORIDE 0.9 % IV SOLN: INTRAVENOUS | Status: AC

## 2022-05-03 MED ORDER — IOHEXOL 350 MG/ML SOLN
75.0000 mL | Freq: Once | INTRAVENOUS | Status: AC | PRN
  Administered 2022-05-03: 75 mL via INTRAVENOUS

## 2022-05-03 MED ORDER — PEG 3350-KCL-NA BICARB-NACL 420 G PO SOLR
4000.0000 mL | Freq: Once | ORAL | Status: AC
  Administered 2022-05-04: 4000 mL via ORAL
  Filled 2022-05-03: qty 4000

## 2022-05-03 MED ORDER — ORAL CARE MOUTH RINSE: 15.0000 mL | OROMUCOSAL | Status: DC | PRN

## 2022-05-03 NOTE — Progress Notes (Signed)
Pt being cleaned of bloody BM during report.  Dayshift RN reports patient has had multiple bloody Bms throughout day.  MD on call notified and at the bedside.

## 2022-05-03 NOTE — Progress Notes (Addendum)
[facility]                                                                               [name], is a 81 y.o. female, DOB - 1940/09/25, QIH:[id number] Admit date - 05/02/2022    Outpatient Primary MD for the patient is Cleophas Dunker, [name], NP  LOS - 0  days    Brief summary   [name] is a 81 y.o. 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   Patient reports having brisk rectal bleeding for the past 3 to 4 days when having a bowel movement.  Has felt increased abdominal distention but no pain.  No nausea or vomiting. She has history of diverticular bleed in 11/2020 s/p embolization with recurrence on 05/2021 that did not require intervention.  She was then admitted in 12/2021 requiring transfusion.  She underwent EGD with no active signs of bleeding.   She followed up with her PCP  on 05/02/2022 and was noted to have downward trending hemoglobin of 11.2 yesterday down to 10.2 today with continuous rectal bleeding and was advised to present to the ED.     Assessment & Plan    Assessment and Plan:* Acute GI bleeding -history of diverticular bleed in 11/2020 s/p embolization with recurrence on 05/2021 that did not require intervention.  She was then admitted in 12/2021 with recurrent GI bleed requiring transfusion.   She underwent EGD with no active signs of bleeding. -Hgb has downward trend from 11.2 (11/1) --> 10.2 --> 8.9 on presentation -->7.7. to 7.1 to 8 this am.  Guiac positive stools.  - s/p 1 unit of Prbc transfusion.  - stat CT angio of the abdomen showed  No acute intra-abdominal or pelvic pathology. No evidence of active GI bleed. Severe diverticulosis of the distal colon without active inflammatory changes. No bowel obstruction -[facility] aware and will see in consultation tomorrow - transfuse to keep hemoglobin greater  than 7.  - anemia panel ordered.  Hypotension Borderline BP with SBP in the 90s -Hold home antihypertensives - fluid bolus this morning.   Restless leg syndrome Continue home meds  COPD GOLD II  Stable.  Continue home bronchodilator. No wheezing heard on exam.  - Continue nocturnal O2 2 L     Estimated body mass index is 32.62 kg/m as calculated from the following:   Height as of this encounter: '5\' 1"'$  (1.549 m).   Weight as of this encounter: 78.3 kg.  Code Status: full code.  DVT Prophylaxis:  SCDs Start: 05/02/22 2316   Level of Care: Level of care: Progressive Family Communication: none at bedside.   Disposition Plan:     Remains inpatient appropriate: GI work up for blood oss anemia.   Procedures:  None.  Consultants:   GASTROENTEROLOGY  Antimicrobials:   Anti-infectives (From admission, onward)    None        Medications  Scheduled Meds:  sodium chloride   Intravenous Once   acetaminophen  1,000 mg Oral BID   acidophilus  1 capsule Oral QHS   vitamin C  1,000 mg Oral q morning   cholecalciferol  2,000 Units Oral q morning   dorzolamide-timolol  1 drop Both Eyes BID   ferrous gluconate  324 mg Oral q morning   latanoprost  1 drop Both Eyes QHS   loratadine  10 mg Oral Daily   mirabegron ER  25 mg Oral Daily   Netarsudil Dimesylate  1 drop Both Eyes QHS   pantoprazole  40 mg Oral BID   sertraline  100 mg Oral Daily   simvastatin  20 mg Oral Daily   umeclidinium-vilanterol  1 puff Inhalation Daily   Continuous Infusions:  sodium chloride 100 mL/hr at 05/03/22 0502   PRN Meds:.albuterol, rOPINIRole    Subjective:   [name] was seen and examined today.  Appears comfortable, no new complaints.  Objective:   Vitals:   05/03/22 0530 05/03/22 0600 05/03/22 0630 05/03/22 0700  BP: (!) 117/45 (!) 100/51 (!) 114/53 (!) 92/46  Pulse: 99 83 (!) 102 78  Resp: '16 16 15 18  '$ Temp:      TempSrc:      SpO2: 96% 95% 97% 95%  Weight:       Height:        Intake/Output Summary (Last 24 hours) at 05/03/2022 0935 Last data filed at 05/03/2022 0700 Gross per 24 hour  Intake 578.22 ml  Output --  Net 578.22 ml   Filed Weights   05/03/22 0344  Weight: 78.3 kg     Exam General: elderly woman, not in distress.  Cardiovascular: S1 S2 auscultated, no murmurs, RRR Respiratory: Clear to auscultation bilaterally, no wheezing, rales or rhonchi Gastrointestinal: Soft, nontender, nondistended, + bowel sounds Ext: no pedal edema bilaterally Neuro: AA Oriented to place and person. Able to move all extremities.  Skin: No rashes Psych: Normal affect and demeanor,    Data Reviewed:  I have personally reviewed following labs and imaging studies   CBC Lab Results  Component Value Date   WBC 5.9 05/03/2022   RBC 2.50 (L) 05/03/2022   HGB 8.0 (L) 05/03/2022   HCT 23.9 (L) 05/03/2022   MCV 95.6 05/03/2022   MCH 32.0 05/03/2022   PLT 116 (L) 05/03/2022   MCHC 33.5 05/03/2022   RDW 14.7 05/03/2022   LYMPHSABS 1.8 01/14/2022   MONOABS 0.8 01/14/2022   EOSABS 0.2 01/14/2022   BASOSABS 0.0 65/79/0383     Last metabolic panel Lab Results  Component Value Date   NA 140 05/03/2022   K 3.6 05/03/2022   CL 111 05/03/2022   CO2 24 05/03/2022   BUN 24 (H) 05/03/2022   CREATININE 0.32 (L) 05/03/2022   GLUCOSE 114 (H) 05/03/2022   GFRNONAA >60 05/03/2022   GFRAA >60 09/14/2017   CALCIUM 7.8 (L) 05/03/2022   PHOS 3.2 05/05/2021   PROT 6.1 (L) 05/02/2022   ALBUMIN 3.5 05/02/2022   BILITOT 0.4 05/02/2022   ALKPHOS 46 05/02/2022   AST 18 05/02/2022   ALT 14 05/02/2022   ANIONGAP 5 05/03/2022    CBG (last 3)  No results for input(s): "GLUCAP" in the last 72 hours.    Coagulation Profile: No results for input(s): "INR", "PROTIME" in the last 168 hours.   Radiology Studies: CT ANGIO GI BLEED  Result Date: 05/03/2022 CLINICAL DATA:  Lower GI bleed. EXAM: CTA ABDOMEN AND PELVIS WITHOUT AND WITH CONTRAST TECHNIQUE:  Multidetector CT imaging of the abdomen and pelvis was performed using the standard protocol during bolus administration of intravenous contrast. Multiplanar reconstructed images and MIPs  were obtained and reviewed to evaluate the vascular anatomy. RADIATION DOSE REDUCTION: This exam was performed according to the departmental dose-optimization program which includes automated exposure control, adjustment of the mA and/or kV according to patient size and/or use of iterative reconstruction technique. CONTRAST:  101m OMNIPAQUE IOHEXOL 350 MG/ML SOLN COMPARISON:  GI bleed study dated 12/19/2020. FINDINGS: Evaluation is limited due to streak artifact caused by patient's arms and overlying support wires. VASCULAR Aorta: Moderate atherosclerotic calcification of the aorta. No aneurysmal dilatation or dissection. No periaortic fluid collection. Celiac: Patent without evidence of aneurysm, dissection, vasculitis or significant stenosis. SMA: Patent without evidence of aneurysm, dissection, vasculitis or significant stenosis. Renals: Atherosclerotic calcification of the origins of the renal arteries. The renal arteries remain patent. IMA: Patent without evidence of aneurysm, dissection, vasculitis or significant stenosis. Inflow: Mild atherosclerotic calcification of the iliac arteries. The iliac arteries are patent. No aneurysmal dilatation or dissection. Proximal Outflow: The visualized proximal femoral arteries are patent. Veins: The IVC is unremarkable. The SMV, splenic vein, and main portal vein are patent. No portal venous gas. Review of the MIP images confirms the above findings. NON-VASCULAR Lower chest: The visualized lung bases are clear. There is coronary vascular calcification. No intra-abdominal free air or free fluid. Hepatobiliary: The liver is unremarkable. Mild biliary ductal dilatation or mild periportal edema. There is small amount of layering sludge or small stones within the gallbladder. No  pericholecystic fluid or evidence of acute cholecystitis by CT. Pancreas: Unremarkable. No pancreatic ductal dilatation or surrounding inflammatory changes. Spleen: Normal in size without focal abnormality. Adrenals/Urinary Tract: The adrenal glands unremarkable. There is no hydronephrosis or nephrolithiasis on either side. There is a 2.7 cm left renal upper pole parapelvic cyst. The visualized ureters and urinary bladder appear unremarkable. Stomach/Bowel: There is severe diverticulosis of the distal colon without active inflammatory changes. There is no bowel obstruction or active inflammation. No evidence of active GI bleed. The appendix is normal. Lymphatic: No adenopathy. Reproductive: The uterus is grossly unremarkable. No adnexal masses. Other: None Musculoskeletal: Osteopenia with degenerative changes of the spine. No acute osseous pathology. IMPRESSION: 1. No acute intra-abdominal or pelvic pathology. No evidence of active GI bleed. 2. Severe diverticulosis of the distal colon without active inflammatory changes. No bowel obstruction. Normal appendix. Electronically Signed   By: AAnner CreteM.D.   On: 05/03/2022 01:12       VHosie PoissonM.D. [facility] 05/03/2022, 9:35 AM  Available via Epic secure chat 7am-7pm After 7 pm, please refer to night coverage provider listed on amion.    Addendum: Received page around 6 :30 pm, regarding NM blood scan, " Positive for active GI bleeding involving the lower GI tract. Exact site difficult to pinpoint but suspected source is splenic flexure of the colon." Notified GI of the results, plan was to prep her for colonoscopy in am.    Paged and discussed with IR on call Dr MMaryelizabeth Kaufmann  bases on previous diverticular bleed and history , suggested intermittent bleeding, recommended to observe overnight and if she bleeds or becomes unstable , to go ahead with CTA and call IR for embolization.   Also called general surgery on Call Dr TMarcello Moores who  will see the patient tomorrow and see if she is a surgical candidate. Updated on call floor coverage NP .  Transferred the patient to ICU for closer monitoring for active bleeding and hemodynamics.  Called the patient's son Dr EWoody Sellerand updated him.     VHosie Poisson, MD

## 2022-05-03 NOTE — Consult Note (Addendum)
Referring Provider: [facility] Primary Care Physician:  Yvonna Alanis, NP Primary Gastroenterologist:  Dr. Paulita Fujita  Reason for Consultation:  Lower GI bleed  HPI: [name] is a 81 y.o. 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   Patient reports that for the last 4 days she has been having large volume bright red blood per rectum.  She notes she will have these episodes every few months.  Typically she will watch to see if the bleeding improves on its own and she does not tell anybody when she has bleeding.  During this most recent episode she would have 6 bloody bowel movements daily after 3 days she realized her rectal bleeding was not improving and she is becoming fatigued so she decided to notify staff at her nursing center.   In the ED, he was noted to have obvious bright red blood in her diaper, hemodynamically stable, hemoglobin 8.9 from 11.3 2 months ago.  Given hemoglobin drop and presence of frank blood GI was consulted and patient was admitted for further evaluation.  Notes stools continue bright red blood but also some dark stools.  Denies nausea, vomiting, constipation.  She is not taking any blood thinning medications.  Denies NSAID use.   Previous hospitalization 01/07/2022 -2023 for melena.  Patient underwent EGD as seen below.  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  EGD 01/11/2022: Gastric polyps, residual food in the stomach, no signs of bleeding, no biopsies taken  Colonoscopy last 2005, unable to review report   Past Medical History:  Diagnosis Date   Anxiety    Arthritis    "bilateral knees, shoulders, elbows; neck, pretty widespread" (09/05/2017)   BPPV (benign paroxysmal positional vertigo)    Depression    GERD (gastroesophageal reflux disease)    Glaucoma, both eyes    Headache    "probably 2/month" (09/05/2017)    History of blood transfusion ~ 2008   "related to LGIB"   Hyperlipemia    Lower GI bleeding ~ 2008; 09/05/2017   "had to have blood transfusion"   Macular degeneration, bilateral    Osteopenia    Seasonal allergies    Skin cancer, basal cell 2001   "off my nose, left side"   Sleeping excessive    Tinnitus of both ears     Past Surgical History:  Procedure Laterality Date   BALLOON DILATION N/A 05/08/2021   Procedure: BALLOON DILATION;  Surgeon: Ronnette Juniper, MD;  Location: [place];  Service: Gastroenterology;  Laterality: N/A;   BASAL CELL CARCINOMA EXCISION  2001   "off my nose, left side"   BIOPSY  05/08/2021   Procedure: BIOPSY;  Surgeon: Ronnette Juniper, MD;  Location: [facility] ENDOSCOPY;  Service: Gastroenterology;;   BLEPHAROPLASTY Bilateral    CATARACT EXTRACTION W/ INTRAOCULAR LENS  IMPLANT, BILATERAL Bilateral 1990's   ESOPHAGOGASTRODUODENOSCOPY (EGD) WITH PROPOFOL N/A 05/08/2021   Procedure: ESOPHAGOGASTRODUODENOSCOPY (EGD) WITH PROPOFOL;  Surgeon: Ronnette Juniper, MD;  Location: [place];  Service: Gastroenterology;  Laterality: N/A;   ESOPHAGOGASTRODUODENOSCOPY (EGD) WITH PROPOFOL N/A 01/11/2022   Procedure: ESOPHAGOGASTRODUODENOSCOPY (EGD) WITH PROPOFOL;  Surgeon: Clarene Essex, MD;  Location: [place];  Service: Gastroenterology;  Laterality: N/A;   EYE SURGERY Bilateral    "to improve vision after cataract OR"   IR ANGIOGRAM FOLLOW UP STUDY  12/19/2020   IR ANGIOGRAM SELECTIVE EACH ADDITIONAL VESSEL  12/19/2020   IR ANGIOGRAM SELECTIVE EACH ADDITIONAL  VESSEL  12/19/2020   IR ANGIOGRAM VISCERAL SELECTIVE  12/19/2020   IR EMBO ART  VEN HEMORR LYMPH EXTRAV  INC GUIDE ROADMAPPING  12/19/2020   IR US GUIDE VASC ACCESS RIGHT  12/19/2020   JOINT REPLACEMENT     STAPEDES SURGERY Left    "scraped stapedes because it was sticking when it wasn't suppose to"   [place] Left ~ 2008   TOTAL KNEE ARTHROPLASTY  12/20/2011   Procedure:  TOTAL KNEE ARTHROPLASTY;  Surgeon: Augustin Schooling, MD;  Location: [place];  Service: Orthopedics;  Laterality: Right;  Right Total Knee Arthroplasty   TUBAL LIGATION  1980's    Prior to Admission medications   Medication Sig Start Date End Date Taking? Authorizing Provider  acetaminophen (TYLENOL) 500 MG tablet Take 1,000 mg by mouth 2 (two) times daily.   Yes [provider]  albuterol (VENTOLIN HFA) 108 (90 Base) MCG/ACT inhaler Inhale 2 puffs into the lungs every 4 (four) hours as needed for wheezing or shortness of breath.   Yes [provider]  alendronate (FOSAMAX) 70 MG tablet Take 70 mg by mouth every Sunday. Take with a full glass of water on an empty stomach.   Yes [provider]  Ascorbic Acid (VITAMIN C) 1000 MG tablet Take 1,000 mg by mouth every morning.   Yes [provider]  Cholecalciferol (VITAMIN D3) 50 MCG (2000 UT) TABS Take 2,000 Units by mouth every morning.   Yes [provider]  docusate sodium (COLACE) 100 MG capsule Take 200 mg by mouth at bedtime.   Yes [provider]  dorzolamide-timolol (COSOPT) 22.3-6.8 MG/ML ophthalmic solution Place 1 drop into both eyes 2 (two) times daily.   Yes [provider]  ferrous gluconate (FERGON) 240 (27 FE) MG tablet Take 240 mg by mouth every morning. Take without food   Yes [provider]  HYDROcodone-acetaminophen (NORCO/VICODIN) 5-325 MG tablet Take 1 tablet by mouth every 6 (six) hours as needed (pain). 12/25/20  Yes [provider]  hydrocortisone (ANUSOL-HC) 2.5 % rectal cream Place 1 Application rectally 2 (two) times daily for 7 days. 05/01/22 05/08/22 Yes [name], [name], NP  latanoprost (XALATAN) 0.005 % ophthalmic solution Place 1 drop into both eyes at bedtime. 02/13/18  Yes [provider]  loratadine (CLARITIN) 10 MG tablet Take 1 tablet (10 mg total) by mouth daily. 08/11/17  Yes Eugenie Filler, MD  Multiple Vitamins-Minerals  (PRESERVISION AREDS 2) CAPS Take 1 capsule by mouth 2 (two) times daily.   Yes [provider]  MYRBETRIQ 25 MG TB24 tablet Take 25 mg by mouth daily. 04/18/22  Yes [provider]  Netarsudil Dimesylate (RHOPRESSA) 0.02 % SOLN Place 1 drop into both eyes at bedtime.   Yes [provider]  pantoprazole (PROTONIX) 40 MG tablet Take 40 mg by mouth 2 (two) times daily.   Yes [provider]  polyethylene glycol (MIRALAX / GLYCOLAX) 17 g packet Take 8.5 g by mouth daily as needed (constipation).   Yes [provider]  Probiotic Product (ALIGN) 4 MG CAPS Take 4 mg by mouth at bedtime.   Yes [provider]  propranolol (INDERAL) 20 MG tablet Take 20 mg by mouth See admin instructions. Take one tablet (20 mg) by mouth daily at bedtime, may also take one tablet (20 mg) daily as needed for pulse over 120 04/30/21  Yes [provider]  rOPINIRole (REQUIP) 1 MG tablet  Take 0.5 mg by mouth at bedtime as needed (restless legs/tremors).   Yes [provider]  sertraline (ZOLOFT) 100 MG tablet Take 1 tablet (100 mg total) by mouth daily. 12/23/20  Yes Nita Sells, MD  simvastatin (ZOCOR) 20 MG tablet Take 20 mg by mouth daily.   Yes [provider]  Sodium Fluoride (PREVIDENT 5000 BOOSTER PLUS) 1.1 % PSTE Place 1 Application onto teeth See admin instructions. Brush on teeth with a toothbrush after evening mouth care. Spit out excess and do not rinse.   Yes [provider]  umeclidinium-vilanterol (ANORO ELLIPTA) 62.5-25 MCG/ACT AEPB Inhale 1 puff into the lungs every morning.   Yes [provider]  zinc oxide 20 % ointment Apply 1 Application topically See admin instructions. Apply topically to buttocks after every incontinent episode and as needed for redness   Yes [provider]  OXYGEN 2lpm with sleep only  [employer]    [provider]    Scheduled Meds:  sodium chloride   Intravenous  Once   sodium chloride   Intravenous Once   sodium chloride   Intravenous Once   acetaminophen  1,000 mg Oral BID   acidophilus  1 capsule Oral QHS   vitamin C  1,000 mg Oral q morning   cholecalciferol  2,000 Units Oral q morning   dorzolamide-timolol  1 drop Both Eyes BID   ferrous gluconate  324 mg Oral q morning   latanoprost  1 drop Both Eyes QHS   loratadine  10 mg Oral Daily   mirabegron ER  25 mg Oral Daily   Netarsudil Dimesylate  1 drop Both Eyes QHS   pantoprazole  40 mg Oral BID   sertraline  100 mg Oral Daily   simvastatin  20 mg Oral Daily   sodium chloride (PF)       umeclidinium-vilanterol  1 puff Inhalation Daily   Continuous Infusions: PRN Meds:.albuterol, rOPINIRole, sodium chloride (PF)  Allergies as of 05/02/2022 - Review Complete 05/02/2022  Allergen Reaction Noted   Morphine and related Itching 11/29/2011   Abaloparatide Other (See Comments) 10/22/2020   Aspirin Other (See Comments) 10/22/2020   Duloxetine hcl Other (See Comments) 10/22/2020   Ventolin [albuterol] Other (See Comments) 12/25/2020   Cefadroxil Hives 12/11/2011    Family History  Problem Relation Age of Onset   Heart attack Mother    Heart disease Father    Heart failure Father    Bladder Cancer Father    Supraventricular tachycardia Sister    Hypertension Sister     Social History   Socioeconomic History   Marital status: Married    Spouse name: Clair Gulling    Number of children: 3   Years of education: College   Highest education level: Not on file  Occupational History   Occupation: Retired Pharmacist, hospital  Tobacco Use   Smoking status: Former    Packs/day: 0.75    Years: 37.00    Total pack years: 27.75    Types: Cigarettes    Quit date: 07/01/1992    Years since quitting: 29.8   Smokeless tobacco: Never  Vaping Use   Vaping Use: Never used  Substance and Sexual Activity   Alcohol use: Yes    Alcohol/week: 7.0 standard drinks of alcohol    Types: 7 Glasses of wine per week     Comment: 09/05/2017  "6 oz wine, 5 days/wk"   Drug use: No    Types: Hydrocodone   Sexual activity: Not Currently  Other  Topics Concern   Not on file  Social History Narrative   1-2 cups of coffee a day    Social Determinants of Health   Financial Resource Strain: Low Risk  (03/19/2022)   Overall Financial Resource Strain (CARDIA)    Difficulty of Paying Living Expenses: Not hard at all  Food Insecurity: No Food Insecurity (05/02/2022)   Hunger Vital Sign    Worried About Running Out of Food in the Last Year: Never true    Ran Out of Food in the Last Year: Never true  Transportation Needs: No Transportation Needs (05/02/2022)   PRAPARE - Hydrologist (Medical): No    Lack of Transportation (Non-Medical): No  Physical Activity: Insufficiently Active (03/19/2022)   Exercise Vital Sign    Days of Exercise per Week: 2 days    Minutes of Exercise per Session: 20 min  Stress: No Stress Concern Present (03/19/2022)   [place]    Feeling of Stress : Not at all  Social Connections: Moderately Isolated (03/19/2022)   Social Connection and Isolation Panel [NHANES]    Frequency of Communication with Friends and Family: More than three times a week    Frequency of Social Gatherings with Friends and Family: Twice a week    Attends Religious Services: Never    Marine scientist or Organizations: No    Attends Archivist Meetings: Never    Marital Status: Married  Human resources officer Violence: Not At Risk (05/02/2022)   Humiliation, Afraid, Rape, and Kick questionnaire    Fear of Current or Ex-Partner: No    Emotionally Abused: No    Physically Abused: No    Sexually Abused: No    Review of Systems: All negative except as stated above in HPI.  Physical Exam:Physical Exam Constitutional:      General: She is not in acute distress.    Appearance: Normal appearance. She is normal weight.   HENT:     Head: Normocephalic and atraumatic.     Right Ear: External ear normal.     Left Ear: External ear normal.     Nose: Nose normal.     Mouth/Throat:     Mouth: Mucous membranes are moist.  Eyes:     Pupils: Pupils are equal, round, and reactive to light.     Comments: Conjunctival pallor  Cardiovascular:     Rate and Rhythm: Regular rhythm. Tachycardia present.     Pulses: Normal pulses.     Heart sounds: Normal heart sounds.  Pulmonary:     Effort: Pulmonary effort is normal. No respiratory distress.     Breath sounds: Normal breath sounds.  Abdominal:     General: Abdomen is flat. Bowel sounds are normal. There is no distension.     Palpations: Abdomen is soft. There is no mass.     Tenderness: There is no abdominal tenderness. There is no guarding or rebound.     Hernia: No hernia is present.  Genitourinary:    Comments: Frank red blood on rectum and around the bed.  Musculoskeletal:        General: No swelling. Normal range of motion.     Cervical back: Normal range of motion and neck supple.  Skin:    General: Skin is warm and dry.     Coloration: Skin is pale.  Neurological:     General: No focal deficit present.     Mental Status:  She is alert and oriented to person, place, and time. Mental status is at baseline.  Psychiatric:        Mood and Affect: Mood normal.        Behavior: Behavior normal.     Vital signs: Vitals:   05/03/22 0700 05/03/22 1210  BP: (!) 92/46 (!) 112/58  Pulse: 78 95  Resp: 18 18  Temp:  98.6 F (37 C)  SpO2: 95% 96%   Last BM Date : 05/03/22   GI:  Lab Results: Recent Labs    05/02/22 1839 05/02/22 2245 05/03/22 0106 05/03/22 0730  WBC 6.4  --   --  5.9  HGB 8.9* 7.7* 7.1* 8.0*  HCT 27.5* 24.1* 22.4* 23.9*  PLT 151  --   --  116*   BMET Recent Labs    05/02/22 1839 05/03/22 0730  NA 139 140  K 3.8 3.6  CL 107 111  CO2 26 24  GLUCOSE 129* 114*  BUN 24* 24*  CREATININE 0.53 0.32*  CALCIUM 8.6* 7.8*    LFT Recent Labs    05/02/22 1839  PROT 6.1*  ALBUMIN 3.5  AST 18  ALT 14  ALKPHOS 46  BILITOT 0.4   PT/INR No results for input(s): "LABPROT", "INR" in the last 72 hours.   Studies/Results: CT ANGIO GI BLEED  Result Date: 05/03/2022 CLINICAL DATA:  Lower GI bleed. EXAM: CTA ABDOMEN AND PELVIS WITHOUT AND WITH CONTRAST TECHNIQUE: Multidetector CT imaging of the abdomen and pelvis was performed using the standard protocol during bolus administration of intravenous contrast. Multiplanar reconstructed images and MIPs were obtained and reviewed to evaluate the vascular anatomy. RADIATION DOSE REDUCTION: This exam was performed according to the departmental dose-optimization program which includes automated exposure control, adjustment of the mA and/or kV according to patient size and/or use of iterative reconstruction technique. CONTRAST:  14m OMNIPAQUE IOHEXOL 350 MG/ML SOLN COMPARISON:  GI bleed study dated 12/19/2020. FINDINGS: Evaluation is limited due to streak artifact caused by patient's arms and overlying support wires. VASCULAR Aorta: Moderate atherosclerotic calcification of the aorta. No aneurysmal dilatation or dissection. No periaortic fluid collection. Celiac: Patent without evidence of aneurysm, dissection, vasculitis or significant stenosis. SMA: Patent without evidence of aneurysm, dissection, vasculitis or significant stenosis. Renals: Atherosclerotic calcification of the origins of the renal arteries. The renal arteries remain patent. IMA: Patent without evidence of aneurysm, dissection, vasculitis or significant stenosis. Inflow: Mild atherosclerotic calcification of the iliac arteries. The iliac arteries are patent. No aneurysmal dilatation or dissection. Proximal Outflow: The visualized proximal femoral arteries are patent. Veins: The IVC is unremarkable. The SMV, splenic vein, and main portal vein are patent. No portal venous gas. Review of the MIP images confirms the above  findings. NON-VASCULAR Lower chest: The visualized lung bases are clear. There is coronary vascular calcification. No intra-abdominal free air or free fluid. Hepatobiliary: The liver is unremarkable. Mild biliary ductal dilatation or mild periportal edema. There is small amount of layering sludge or small stones within the gallbladder. No pericholecystic fluid or evidence of acute cholecystitis by CT. Pancreas: Unremarkable. No pancreatic ductal dilatation or surrounding inflammatory changes. Spleen: Normal in size without focal abnormality. Adrenals/Urinary Tract: The adrenal glands unremarkable. There is no hydronephrosis or nephrolithiasis on either side. There is a 2.7 cm left renal upper pole parapelvic cyst. The visualized ureters and urinary bladder appear unremarkable. Stomach/Bowel: There is severe diverticulosis of the distal colon without active inflammatory changes. There is no bowel obstruction or active inflammation. No evidence  of active GI bleed. The appendix is normal. Lymphatic: No adenopathy. Reproductive: The uterus is grossly unremarkable. No adnexal masses. Other: None Musculoskeletal: Osteopenia with degenerative changes of the spine. No acute osseous pathology. IMPRESSION: 1. No acute intra-abdominal or pelvic pathology. No evidence of active GI bleed. 2. Severe diverticulosis of the distal colon without active inflammatory changes. No bowel obstruction. Normal appendix. Electronically Signed   By: Anner Crete M.D.   On: 05/03/2022 01:12    Impression: Lower GI bleed Anemia   Patient was evaluated accompanied by Dr. Randel Pigg, patient had frank red blood across her rectum as well as swelling her bed sheet.  She was hypotensive and tachycardic.  She was placed in Trendelenburg position with improvement of blood pressure.  H&H was ordered, 1 unit packed red blood cells was ordered.  Stat CT angio GI bleed was ordered.  Hgb 8.0(7.1)  CTA GI bleed from 0035 11/3: No active GI  bleed  Plan: Repeat stat CT Angio GI bleed, if positive recommend IR involvement  Continue to monitor Hgb, transfuse if less than 7 Continue pantoprazole 40 mg BID [facility] will follow   LOS: 0 days   Charlott Rakes  PA-C 05/03/2022, 12:13 PM  Contact #  [phone]

## 2022-05-03 NOTE — H&P (View-Only) (Signed)
Referring Provider: [facility] Primary Care Physician:  Yvonna Alanis, NP Primary Gastroenterologist:  Dr. Paulita Fujita  Reason for Consultation:  Lower GI bleed  HPI: [name] is a 81 y.o. 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   Patient reports that for the last 4 days she has been having large volume bright red blood per rectum.  She notes she will have these episodes every few months.  Typically she will watch to see if the bleeding improves on its own and she does not tell anybody when she has bleeding.  During this most recent episode she would have 6 bloody bowel movements daily after 3 days she realized her rectal bleeding was not improving and she is becoming fatigued so she decided to notify staff at her nursing center.   In the ED, he was noted to have obvious bright red blood in her diaper, hemodynamically stable, hemoglobin 8.9 from 11.3 2 months ago.  Given hemoglobin drop and presence of frank blood GI was consulted and patient was admitted for further evaluation.  Notes stools continue bright red blood but also some dark stools.  Denies nausea, vomiting, constipation.  She is not taking any blood thinning medications.  Denies NSAID use.   Previous hospitalization 01/07/2022 -2023 for melena.  Patient underwent EGD as seen below.  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  EGD 01/11/2022: Gastric polyps, residual food in the stomach, no signs of bleeding, no biopsies taken  Colonoscopy last 2005, unable to review report   Past Medical History:  Diagnosis Date   Anxiety    Arthritis    "bilateral knees, shoulders, elbows; neck, pretty widespread" (09/05/2017)   BPPV (benign paroxysmal positional vertigo)    Depression    GERD (gastroesophageal reflux disease)    Glaucoma, both eyes    Headache    "probably 2/month" (09/05/2017)    History of blood transfusion ~ 2008   "related to LGIB"   Hyperlipemia    Lower GI bleeding ~ 2008; 09/05/2017   "had to have blood transfusion"   Macular degeneration, bilateral    Osteopenia    Seasonal allergies    Skin cancer, basal cell 2001   "off my nose, left side"   Sleeping excessive    Tinnitus of both ears     Past Surgical History:  Procedure Laterality Date   BALLOON DILATION N/A 05/08/2021   Procedure: BALLOON DILATION;  Surgeon: Ronnette Juniper, MD;  Location: [place];  Service: Gastroenterology;  Laterality: N/A;   BASAL CELL CARCINOMA EXCISION  2001   "off my nose, left side"   BIOPSY  05/08/2021   Procedure: BIOPSY;  Surgeon: Ronnette Juniper, MD;  Location: [facility] ENDOSCOPY;  Service: Gastroenterology;;   BLEPHAROPLASTY Bilateral    CATARACT EXTRACTION W/ INTRAOCULAR LENS  IMPLANT, BILATERAL Bilateral 1990's   ESOPHAGOGASTRODUODENOSCOPY (EGD) WITH PROPOFOL N/A 05/08/2021   Procedure: ESOPHAGOGASTRODUODENOSCOPY (EGD) WITH PROPOFOL;  Surgeon: Ronnette Juniper, MD;  Location: [place];  Service: Gastroenterology;  Laterality: N/A;   ESOPHAGOGASTRODUODENOSCOPY (EGD) WITH PROPOFOL N/A 01/11/2022   Procedure: ESOPHAGOGASTRODUODENOSCOPY (EGD) WITH PROPOFOL;  Surgeon: Clarene Essex, MD;  Location: [place];  Service: Gastroenterology;  Laterality: N/A;   EYE SURGERY Bilateral    "to improve vision after cataract OR"   IR ANGIOGRAM FOLLOW UP STUDY  12/19/2020   IR ANGIOGRAM SELECTIVE EACH ADDITIONAL VESSEL  12/19/2020   IR ANGIOGRAM SELECTIVE EACH ADDITIONAL  VESSEL  12/19/2020   IR ANGIOGRAM VISCERAL SELECTIVE  12/19/2020   IR EMBO ART  VEN HEMORR LYMPH EXTRAV  INC GUIDE ROADMAPPING  12/19/2020   IR US GUIDE VASC ACCESS RIGHT  12/19/2020   JOINT REPLACEMENT     STAPEDES SURGERY Left    "scraped stapedes because it was sticking when it wasn't suppose to"   [place] Left ~ 2008   TOTAL KNEE ARTHROPLASTY  12/20/2011   Procedure:  TOTAL KNEE ARTHROPLASTY;  Surgeon: Augustin Schooling, MD;  Location: [place];  Service: Orthopedics;  Laterality: Right;  Right Total Knee Arthroplasty   TUBAL LIGATION  1980's    Prior to Admission medications   Medication Sig Start Date End Date Taking? Authorizing Provider  acetaminophen (TYLENOL) 500 MG tablet Take 1,000 mg by mouth 2 (two) times daily.   Yes [provider]  albuterol (VENTOLIN HFA) 108 (90 Base) MCG/ACT inhaler Inhale 2 puffs into the lungs every 4 (four) hours as needed for wheezing or shortness of breath.   Yes [provider]  alendronate (FOSAMAX) 70 MG tablet Take 70 mg by mouth every Sunday. Take with a full glass of water on an empty stomach.   Yes [provider]  Ascorbic Acid (VITAMIN C) 1000 MG tablet Take 1,000 mg by mouth every morning.   Yes [provider]  Cholecalciferol (VITAMIN D3) 50 MCG (2000 UT) TABS Take 2,000 Units by mouth every morning.   Yes [provider]  docusate sodium (COLACE) 100 MG capsule Take 200 mg by mouth at bedtime.   Yes [provider]  dorzolamide-timolol (COSOPT) 22.3-6.8 MG/ML ophthalmic solution Place 1 drop into both eyes 2 (two) times daily.   Yes [provider]  ferrous gluconate (FERGON) 240 (27 FE) MG tablet Take 240 mg by mouth every morning. Take without food   Yes [provider]  HYDROcodone-acetaminophen (NORCO/VICODIN) 5-325 MG tablet Take 1 tablet by mouth every 6 (six) hours as needed (pain). 12/25/20  Yes [provider]  hydrocortisone (ANUSOL-HC) 2.5 % rectal cream Place 1 Application rectally 2 (two) times daily for 7 days. 05/01/22 05/08/22 Yes [name], [name], NP  latanoprost (XALATAN) 0.005 % ophthalmic solution Place 1 drop into both eyes at bedtime. 02/13/18  Yes [provider]  loratadine (CLARITIN) 10 MG tablet Take 1 tablet (10 mg total) by mouth daily. 08/11/17  Yes Eugenie Filler, MD  Multiple Vitamins-Minerals  (PRESERVISION AREDS 2) CAPS Take 1 capsule by mouth 2 (two) times daily.   Yes [provider]  MYRBETRIQ 25 MG TB24 tablet Take 25 mg by mouth daily. 04/18/22  Yes [provider]  Netarsudil Dimesylate (RHOPRESSA) 0.02 % SOLN Place 1 drop into both eyes at bedtime.   Yes [provider]  pantoprazole (PROTONIX) 40 MG tablet Take 40 mg by mouth 2 (two) times daily.   Yes [provider]  polyethylene glycol (MIRALAX / GLYCOLAX) 17 g packet Take 8.5 g by mouth daily as needed (constipation).   Yes [provider]  Probiotic Product (ALIGN) 4 MG CAPS Take 4 mg by mouth at bedtime.   Yes [provider]  propranolol (INDERAL) 20 MG tablet Take 20 mg by mouth See admin instructions. Take one tablet (20 mg) by mouth daily at bedtime, may also take one tablet (20 mg) daily as needed for pulse over 120 04/30/21  Yes [provider]  rOPINIRole (REQUIP) 1 MG tablet  Take 0.5 mg by mouth at bedtime as needed (restless legs/tremors).   Yes [provider]  sertraline (ZOLOFT) 100 MG tablet Take 1 tablet (100 mg total) by mouth daily. 12/23/20  Yes Nita Sells, MD  simvastatin (ZOCOR) 20 MG tablet Take 20 mg by mouth daily.   Yes [provider]  Sodium Fluoride (PREVIDENT 5000 BOOSTER PLUS) 1.1 % PSTE Place 1 Application onto teeth See admin instructions. Brush on teeth with a toothbrush after evening mouth care. Spit out excess and do not rinse.   Yes [provider]  umeclidinium-vilanterol (ANORO ELLIPTA) 62.5-25 MCG/ACT AEPB Inhale 1 puff into the lungs every morning.   Yes [provider]  zinc oxide 20 % ointment Apply 1 Application topically See admin instructions. Apply topically to buttocks after every incontinent episode and as needed for redness   Yes [provider]  OXYGEN 2lpm with sleep only  [employer]    [provider]    Scheduled Meds:  sodium chloride   Intravenous  Once   sodium chloride   Intravenous Once   sodium chloride   Intravenous Once   acetaminophen  1,000 mg Oral BID   acidophilus  1 capsule Oral QHS   vitamin C  1,000 mg Oral q morning   cholecalciferol  2,000 Units Oral q morning   dorzolamide-timolol  1 drop Both Eyes BID   ferrous gluconate  324 mg Oral q morning   latanoprost  1 drop Both Eyes QHS   loratadine  10 mg Oral Daily   mirabegron ER  25 mg Oral Daily   Netarsudil Dimesylate  1 drop Both Eyes QHS   pantoprazole  40 mg Oral BID   sertraline  100 mg Oral Daily   simvastatin  20 mg Oral Daily   sodium chloride (PF)       umeclidinium-vilanterol  1 puff Inhalation Daily   Continuous Infusions: PRN Meds:.albuterol, rOPINIRole, sodium chloride (PF)  Allergies as of 05/02/2022 - Review Complete 05/02/2022  Allergen Reaction Noted   Morphine and related Itching 11/29/2011   Abaloparatide Other (See Comments) 10/22/2020   Aspirin Other (See Comments) 10/22/2020   Duloxetine hcl Other (See Comments) 10/22/2020   Ventolin [albuterol] Other (See Comments) 12/25/2020   Cefadroxil Hives 12/11/2011    Family History  Problem Relation Age of Onset   Heart attack Mother    Heart disease Father    Heart failure Father    Bladder Cancer Father    Supraventricular tachycardia Sister    Hypertension Sister     Social History   Socioeconomic History   Marital status: Married    Spouse name: Clair Gulling    Number of children: 3   Years of education: College   Highest education level: Not on file  Occupational History   Occupation: Retired Pharmacist, hospital  Tobacco Use   Smoking status: Former    Packs/day: 0.75    Years: 37.00    Total pack years: 27.75    Types: Cigarettes    Quit date: 07/01/1992    Years since quitting: 29.8   Smokeless tobacco: Never  Vaping Use   Vaping Use: Never used  Substance and Sexual Activity   Alcohol use: Yes    Alcohol/week: 7.0 standard drinks of alcohol    Types: 7 Glasses of wine per week     Comment: 09/05/2017  "6 oz wine, 5 days/wk"   Drug use: No    Types: Hydrocodone   Sexual activity: Not Currently  Other  Topics Concern   Not on file  Social History Narrative   1-2 cups of coffee a day    Social Determinants of Health   Financial Resource Strain: Low Risk  (03/19/2022)   Overall Financial Resource Strain (CARDIA)    Difficulty of Paying Living Expenses: Not hard at all  Food Insecurity: No Food Insecurity (05/02/2022)   Hunger Vital Sign    Worried About Running Out of Food in the Last Year: Never true    Ran Out of Food in the Last Year: Never true  Transportation Needs: No Transportation Needs (05/02/2022)   PRAPARE - Hydrologist (Medical): No    Lack of Transportation (Non-Medical): No  Physical Activity: Insufficiently Active (03/19/2022)   Exercise Vital Sign    Days of Exercise per Week: 2 days    Minutes of Exercise per Session: 20 min  Stress: No Stress Concern Present (03/19/2022)   [place]    Feeling of Stress : Not at all  Social Connections: Moderately Isolated (03/19/2022)   Social Connection and Isolation Panel [NHANES]    Frequency of Communication with Friends and Family: More than three times a week    Frequency of Social Gatherings with Friends and Family: Twice a week    Attends Religious Services: Never    Marine scientist or Organizations: No    Attends Archivist Meetings: Never    Marital Status: Married  Human resources officer Violence: Not At Risk (05/02/2022)   Humiliation, Afraid, Rape, and Kick questionnaire    Fear of Current or Ex-Partner: No    Emotionally Abused: No    Physically Abused: No    Sexually Abused: No    Review of Systems: All negative except as stated above in HPI.  Physical Exam:Physical Exam Constitutional:      General: She is not in acute distress.    Appearance: Normal appearance. She is normal weight.   HENT:     Head: Normocephalic and atraumatic.     Right Ear: External ear normal.     Left Ear: External ear normal.     Nose: Nose normal.     Mouth/Throat:     Mouth: Mucous membranes are moist.  Eyes:     Pupils: Pupils are equal, round, and reactive to light.     Comments: Conjunctival pallor  Cardiovascular:     Rate and Rhythm: Regular rhythm. Tachycardia present.     Pulses: Normal pulses.     Heart sounds: Normal heart sounds.  Pulmonary:     Effort: Pulmonary effort is normal. No respiratory distress.     Breath sounds: Normal breath sounds.  Abdominal:     General: Abdomen is flat. Bowel sounds are normal. There is no distension.     Palpations: Abdomen is soft. There is no mass.     Tenderness: There is no abdominal tenderness. There is no guarding or rebound.     Hernia: No hernia is present.  Genitourinary:    Comments: Frank red blood on rectum and around the bed.  Musculoskeletal:        General: No swelling. Normal range of motion.     Cervical back: Normal range of motion and neck supple.  Skin:    General: Skin is warm and dry.     Coloration: Skin is pale.  Neurological:     General: No focal deficit present.     Mental Status:  She is alert and oriented to person, place, and time. Mental status is at baseline.  Psychiatric:        Mood and Affect: Mood normal.        Behavior: Behavior normal.     Vital signs: Vitals:   05/03/22 0700 05/03/22 1210  BP: (!) 92/46 (!) 112/58  Pulse: 78 95  Resp: 18 18  Temp:  98.6 F (37 C)  SpO2: 95% 96%   Last BM Date : 05/03/22   GI:  Lab Results: Recent Labs    05/02/22 1839 05/02/22 2245 05/03/22 0106 05/03/22 0730  WBC 6.4  --   --  5.9  HGB 8.9* 7.7* 7.1* 8.0*  HCT 27.5* 24.1* 22.4* 23.9*  PLT 151  --   --  116*   BMET Recent Labs    05/02/22 1839 05/03/22 0730  NA 139 140  K 3.8 3.6  CL 107 111  CO2 26 24  GLUCOSE 129* 114*  BUN 24* 24*  CREATININE 0.53 0.32*  CALCIUM 8.6* 7.8*    LFT Recent Labs    05/02/22 1839  PROT 6.1*  ALBUMIN 3.5  AST 18  ALT 14  ALKPHOS 46  BILITOT 0.4   PT/INR No results for input(s): "LABPROT", "INR" in the last 72 hours.   Studies/Results: CT ANGIO GI BLEED  Result Date: 05/03/2022 CLINICAL DATA:  Lower GI bleed. EXAM: CTA ABDOMEN AND PELVIS WITHOUT AND WITH CONTRAST TECHNIQUE: Multidetector CT imaging of the abdomen and pelvis was performed using the standard protocol during bolus administration of intravenous contrast. Multiplanar reconstructed images and MIPs were obtained and reviewed to evaluate the vascular anatomy. RADIATION DOSE REDUCTION: This exam was performed according to the departmental dose-optimization program which includes automated exposure control, adjustment of the mA and/or kV according to patient size and/or use of iterative reconstruction technique. CONTRAST:  23m OMNIPAQUE IOHEXOL 350 MG/ML SOLN COMPARISON:  GI bleed study dated 12/19/2020. FINDINGS: Evaluation is limited due to streak artifact caused by patient's arms and overlying support wires. VASCULAR Aorta: Moderate atherosclerotic calcification of the aorta. No aneurysmal dilatation or dissection. No periaortic fluid collection. Celiac: Patent without evidence of aneurysm, dissection, vasculitis or significant stenosis. SMA: Patent without evidence of aneurysm, dissection, vasculitis or significant stenosis. Renals: Atherosclerotic calcification of the origins of the renal arteries. The renal arteries remain patent. IMA: Patent without evidence of aneurysm, dissection, vasculitis or significant stenosis. Inflow: Mild atherosclerotic calcification of the iliac arteries. The iliac arteries are patent. No aneurysmal dilatation or dissection. Proximal Outflow: The visualized proximal femoral arteries are patent. Veins: The IVC is unremarkable. The SMV, splenic vein, and main portal vein are patent. No portal venous gas. Review of the MIP images confirms the above  findings. NON-VASCULAR Lower chest: The visualized lung bases are clear. There is coronary vascular calcification. No intra-abdominal free air or free fluid. Hepatobiliary: The liver is unremarkable. Mild biliary ductal dilatation or mild periportal edema. There is small amount of layering sludge or small stones within the gallbladder. No pericholecystic fluid or evidence of acute cholecystitis by CT. Pancreas: Unremarkable. No pancreatic ductal dilatation or surrounding inflammatory changes. Spleen: Normal in size without focal abnormality. Adrenals/Urinary Tract: The adrenal glands unremarkable. There is no hydronephrosis or nephrolithiasis on either side. There is a 2.7 cm left renal upper pole parapelvic cyst. The visualized ureters and urinary bladder appear unremarkable. Stomach/Bowel: There is severe diverticulosis of the distal colon without active inflammatory changes. There is no bowel obstruction or active inflammation. No evidence  of active GI bleed. The appendix is normal. Lymphatic: No adenopathy. Reproductive: The uterus is grossly unremarkable. No adnexal masses. Other: None Musculoskeletal: Osteopenia with degenerative changes of the spine. No acute osseous pathology. IMPRESSION: 1. No acute intra-abdominal or pelvic pathology. No evidence of active GI bleed. 2. Severe diverticulosis of the distal colon without active inflammatory changes. No bowel obstruction. Normal appendix. Electronically Signed   By: Anner Crete M.D.   On: 05/03/2022 01:12    Impression: Lower GI bleed Anemia   Patient was evaluated accompanied by Dr. Randel Pigg, patient had frank red blood across her rectum as well as swelling her bed sheet.  She was hypotensive and tachycardic.  She was placed in Trendelenburg position with improvement of blood pressure.  H&H was ordered, 1 unit packed red blood cells was ordered.  Stat CT angio GI bleed was ordered.  Hgb 8.0(7.1)  CTA GI bleed from 0035 11/3: No active GI  bleed  Plan: Repeat stat CT Angio GI bleed, if positive recommend IR involvement  Continue to monitor Hgb, transfuse if less than 7 Continue pantoprazole 40 mg BID [facility] will follow   LOS: 0 days   Charlott Rakes  PA-C 05/03/2022, 12:13 PM  Contact #  [phone]

## 2022-05-03 NOTE — ED Notes (Signed)
Pt needs 20 g or greater IV for CT

## 2022-05-04 ENCOUNTER — Encounter (HOSPITAL_COMMUNITY)
Admission: EM | Disposition: A | Discharge status: Nursing Home (Includes ICF) | Payer: Self-pay | Source: Skilled Nursing Facility | Attending: Internal Medicine

## 2022-05-04 ENCOUNTER — Inpatient Hospital Stay (HOSPITAL_COMMUNITY): Payer: Medicare HMO | Admitting: Anesthesiology

## 2022-05-04 ENCOUNTER — Encounter (HOSPITAL_COMMUNITY): Payer: Self-pay | Admitting: Internal Medicine

## 2022-05-04 DIAGNOSIS — I9589 Other hypotension: Secondary | ICD-10-CM | POA: Diagnosis not present

## 2022-05-04 DIAGNOSIS — J449 Chronic obstructive pulmonary disease, unspecified: Secondary | ICD-10-CM | POA: Diagnosis not present

## 2022-05-04 DIAGNOSIS — G2581 Restless legs syndrome: Secondary | ICD-10-CM | POA: Diagnosis not present

## 2022-05-04 DIAGNOSIS — I10 Essential (primary) hypertension: Secondary | ICD-10-CM

## 2022-05-04 DIAGNOSIS — D123 Benign neoplasm of transverse colon: Secondary | ICD-10-CM

## 2022-05-04 DIAGNOSIS — Z87891 Personal history of nicotine dependence: Secondary | ICD-10-CM

## 2022-05-04 DIAGNOSIS — K5731 Diverticulosis of large intestine without perforation or abscess with bleeding: Secondary | ICD-10-CM

## 2022-05-04 DIAGNOSIS — K922 Gastrointestinal hemorrhage, unspecified: Secondary | ICD-10-CM | POA: Diagnosis not present

## 2022-05-04 HISTORY — PX: POLYPECTOMY: SHX5525

## 2022-05-04 HISTORY — PX: COLONOSCOPY WITH PROPOFOL: SHX5780

## 2022-05-04 LAB — CBC WITH DIFFERENTIAL/PLATELET
Abs Immature Granulocytes: 0.04 10*3/uL (ref 0.00–0.07)
Abs Immature Granulocytes: 0.05 10*3/uL (ref 0.00–0.07)
Basophils Absolute: 0 10*3/uL (ref 0.0–0.1)
Basophils Absolute: 0 10*3/uL (ref 0.0–0.1)
Basophils Relative: 0 %
Basophils Relative: 1 %
Eosinophils Absolute: 0.2 10*3/uL (ref 0.0–0.5)
Eosinophils Absolute: 0.2 10*3/uL (ref 0.0–0.5)
Eosinophils Relative: 2 %
Eosinophils Relative: 3 %
HCT: 23.1 % — ABNORMAL LOW (ref 36.0–46.0)
HCT: 22 % — ABNORMAL LOW (ref 36.0–46.0)
Hemoglobin: 7.6 g/dL — ABNORMAL LOW (ref 12.0–15.0)
Hemoglobin: 7.3 g/dL — ABNORMAL LOW (ref 12.0–15.0)
Immature Granulocytes: 1 %
Immature Granulocytes: 1 %
Lymphocytes Relative: 19 %
Lymphocytes Relative: 24 %
Lymphs Abs: 1.3 10*3/uL (ref 0.7–4.0)
Lymphs Abs: 1.6 10*3/uL (ref 0.7–4.0)
MCH: 31.5 pg (ref 26.0–34.0)
MCH: 31.5 pg (ref 26.0–34.0)
MCHC: 32.9 g/dL (ref 30.0–36.0)
MCHC: 33.2 g/dL (ref 30.0–36.0)
MCV: 95.9 fL (ref 80.0–100.0)
MCV: 94.8 fL (ref 80.0–100.0)
Monocytes Absolute: 0.9 10*3/uL (ref 0.1–1.0)
Monocytes Absolute: 0.9 10*3/uL (ref 0.1–1.0)
Monocytes Relative: 13 %
Monocytes Relative: 13 %
Neutro Abs: 4.5 10*3/uL (ref 1.7–7.7)
Neutro Abs: 3.9 10*3/uL (ref 1.7–7.7)
Neutrophils Relative %: 65 %
Neutrophils Relative %: 58 %
Platelets: 101 10*3/uL — ABNORMAL LOW (ref 150–400)
Platelets: 114 10*3/uL — ABNORMAL LOW (ref 150–400)
RBC: 2.41 MIL/uL — ABNORMAL LOW (ref 3.87–5.11)
RBC: 2.32 MIL/uL — ABNORMAL LOW (ref 3.87–5.11)
RDW: 17 % — ABNORMAL HIGH (ref 11.5–15.5)
RDW: 16.8 % — ABNORMAL HIGH (ref 11.5–15.5)
WBC: 7 10*3/uL (ref 4.0–10.5)
WBC: 6.7 10*3/uL (ref 4.0–10.5)
nRBC: 0 % (ref 0.0–0.2)
nRBC: 0 % (ref 0.0–0.2)

## 2022-05-04 LAB — BASIC METABOLIC PANEL
Anion gap: 5 (ref 5–15)
BUN: 20 mg/dL (ref 8–23)
CO2: 22 mmol/L (ref 22–32)
Calcium: 7.3 mg/dL — ABNORMAL LOW (ref 8.9–10.3)
Chloride: 113 mmol/L — ABNORMAL HIGH (ref 98–111)
Creatinine, Ser: 0.34 mg/dL — ABNORMAL LOW (ref 0.44–1.00)
GFR, Estimated: >60 mL/min (ref >60)
Glucose, Bld: 108 mg/dL — ABNORMAL HIGH (ref 70–99)
Potassium: 3.2 mmol/L — ABNORMAL LOW (ref 3.5–5.1)
Sodium: 140 mmol/L (ref 135–145)

## 2022-05-04 LAB — PREPARE RBC (CROSSMATCH)

## 2022-05-04 LAB — HEMOGLOBIN AND HEMATOCRIT, BLOOD
HCT: 24.3 % — ABNORMAL LOW (ref 36.0–46.0)
Hemoglobin: 8.1 g/dL — ABNORMAL LOW (ref 12.0–15.0)

## 2022-05-04 SURGERY — COLONOSCOPY WITH PROPOFOL
Anesthesia: Monitor Anesthesia Care

## 2022-05-04 MED ORDER — PHENYLEPHRINE 80 MCG/ML (10ML) SYRINGE FOR IV PUSH (FOR BLOOD PRESSURE SUPPORT)
PREFILLED_SYRINGE | INTRAVENOUS | Status: DC | PRN
  Administered 2022-05-04: 160 ug via INTRAVENOUS
  Administered 2022-05-04: 80 ug via INTRAVENOUS
  Administered 2022-05-04 (×2): 160 ug via INTRAVENOUS
  Administered 2022-05-04 (×2): 240 ug via INTRAVENOUS

## 2022-05-04 MED ORDER — PROPOFOL 10 MG/ML IV BOLUS
INTRAVENOUS | Status: DC | PRN
  Administered 2022-05-04: 20 mg via INTRAVENOUS

## 2022-05-04 MED ORDER — MELATONIN 3 MG PO TABS
3.0000 mg | ORAL_TABLET | Freq: Once | ORAL | Status: DC
  Filled 2022-05-04: qty 1

## 2022-05-04 MED ORDER — SODIUM CHLORIDE 0.9 % IV BOLUS
500.0000 mL | Freq: Once | INTRAVENOUS | Status: AC
  Administered 2022-05-04: 500 mL via INTRAVENOUS

## 2022-05-04 MED ORDER — SODIUM CHLORIDE 0.9% IV SOLUTION: Freq: Once | INTRAVENOUS | Status: AC

## 2022-05-04 MED ORDER — HYDROCODONE-ACETAMINOPHEN 5-325 MG PO TABS
1.0000 | ORAL_TABLET | Freq: Once | ORAL | Status: AC
  Administered 2022-05-04: 1 via ORAL
  Filled 2022-05-04: qty 1

## 2022-05-04 MED ORDER — PROPOFOL 1000 MG/100ML IV EMUL
INTRAVENOUS | Status: AC
  Filled 2022-05-04: qty 100

## 2022-05-04 MED ORDER — PROPOFOL 500 MG/50ML IV EMUL
INTRAVENOUS | Status: DC | PRN
  Administered 2022-05-04: 80 ug/kg/min via INTRAVENOUS

## 2022-05-04 MED ORDER — POTASSIUM CHLORIDE 10 MEQ/100ML IV SOLN
INTRAVENOUS | Status: AC
  Filled 2022-05-04: qty 100

## 2022-05-04 MED ORDER — POTASSIUM CHLORIDE 10 MEQ/100ML IV SOLN
10.0000 meq | INTRAVENOUS | Status: AC
  Administered 2022-05-04 (×3): 10 meq via INTRAVENOUS
  Filled 2022-05-04 (×3): qty 100

## 2022-05-04 MED ORDER — LACTATED RINGERS IV BOLUS
500.0000 mL | Freq: Once | INTRAVENOUS | Status: AC
  Administered 2022-05-04: 500 mL via INTRAVENOUS

## 2022-05-04 SURGICAL SUPPLY — 22 items

## 2022-05-04 NOTE — Transfer of Care (Signed)
Immediate Anesthesia Transfer of Care Note  Patient: [name]  Procedure(s) Performed: Procedure(s): COLONOSCOPY WITH PROPOFOL (N/A) POLYPECTOMY  Patient Location: PACU and Endoscopy Unit  Anesthesia Type:MAC  Level of Consciousness: awake, alert  and oriented  Airway & Oxygen Therapy: Patient Spontanous Breathing and Patient connected to nasal cannula oxygen  Post-op Assessment: Report given to RN and Post -op Vital signs reviewed and stable  Post vital signs: Reviewed and stable  Last Vitals:  Vitals:   05/04/22 1248 05/04/22 1300  BP:  (!) 106/48  Pulse: 88 83  Resp: (!) 21 17  Temp:    SpO2: 110% 03%    Complications: No apparent anesthesia complications

## 2022-05-04 NOTE — Consult Note (Signed)
Reason for Consult:gi bleed Referring Physician: Dr [name] is an 81 y.o. female.  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  She was admitted with brbpr several days ago. These happen sometimes prior to admission every several months. She does have history of a diverticular bleed that was embolized. Some question of prior small bowel avms also.  She has egd in 2023 that was basically negative.  Not sure when last csc was but according to gi note may have been 2005.  She has undergone two negative ct angios since she was here and a bleeding scan that shows bleeding in "lower gi tract" possibly splenic flexure.  We were asked to see her. She has no complaints except her neck when I see her.    Past Medical History:  Diagnosis Date   Anxiety    Arthritis    "bilateral knees, shoulders, elbows; neck, pretty widespread" (09/05/2017)   BPPV (benign paroxysmal positional vertigo)    Depression    GERD (gastroesophageal reflux disease)    Glaucoma, both eyes    Headache    "probably 2/month" (09/05/2017)   History of blood transfusion ~ 2008   "related to LGIB"   Hyperlipemia    Lower GI bleeding ~ 2008; 09/05/2017   "had to have blood transfusion"   Macular degeneration, bilateral    Osteopenia    Seasonal allergies    Skin cancer, basal cell 2001   "off my nose, left side"   Sleeping excessive    Tinnitus of both ears     Past Surgical History:  Procedure Laterality Date   BALLOON DILATION N/A 05/08/2021   Procedure: BALLOON DILATION;  Surgeon: Betty Juniper, MD;  Location: [place];  Service: Gastroenterology;  Laterality: N/A;   BASAL CELL CARCINOMA EXCISION  2001   "off my nose, left side"   BIOPSY  05/08/2021   Procedure: BIOPSY;  Surgeon: Betty Juniper, MD;  Location: [facility] ENDOSCOPY;  Service: Gastroenterology;;   BLEPHAROPLASTY Bilateral    CATARACT EXTRACTION W/ INTRAOCULAR LENS  IMPLANT, BILATERAL Bilateral 1990's    ESOPHAGOGASTRODUODENOSCOPY (EGD) WITH PROPOFOL N/A 05/08/2021   Procedure: ESOPHAGOGASTRODUODENOSCOPY (EGD) WITH PROPOFOL;  Surgeon: Betty Juniper, MD;  Location: [place];  Service: Gastroenterology;  Laterality: N/A;   ESOPHAGOGASTRODUODENOSCOPY (EGD) WITH PROPOFOL N/A 01/11/2022   Procedure: ESOPHAGOGASTRODUODENOSCOPY (EGD) WITH PROPOFOL;  Surgeon: Betty Essex, MD;  Location: [place];  Service: Gastroenterology;  Laterality: N/A;   EYE SURGERY Bilateral    "to improve vision after cataract OR"   IR ANGIOGRAM FOLLOW UP STUDY  12/19/2020   IR ANGIOGRAM SELECTIVE EACH ADDITIONAL VESSEL  12/19/2020   IR ANGIOGRAM SELECTIVE EACH ADDITIONAL VESSEL  12/19/2020   IR ANGIOGRAM VISCERAL SELECTIVE  12/19/2020   IR EMBO ART  VEN HEMORR LYMPH EXTRAV  INC GUIDE ROADMAPPING  12/19/2020   IR US GUIDE VASC ACCESS RIGHT  12/19/2020   JOINT REPLACEMENT     STAPEDES SURGERY Left    "scraped stapedes because it was sticking when it wasn't suppose to"   [employer] Left ~ 2008   TOTAL KNEE ARTHROPLASTY  12/20/2011   Procedure: TOTAL KNEE ARTHROPLASTY;  Surgeon: Betty Schooling, MD;  Location: [place];  Service: Orthopedics;  Laterality: Right;  Right Total Knee Arthroplasty   TUBAL LIGATION  1980's    Family History  Problem Relation Age of Onset   Heart attack Mother  Heart disease Father    Heart failure Father    Bladder Cancer Father    Supraventricular tachycardia Sister    Hypertension Sister     Social History:  reports that she quit smoking about 29 years ago. Her smoking use included cigarettes. She has a 27.75 pack-year smoking history. She has never used smokeless tobacco. She reports current alcohol use of about 7.0 standard drinks of alcohol per week. She reports that she does not use drugs.  Allergies:  Allergies  Allergen Reactions   Morphine And Related Itching   Abaloparatide Other (See Comments)    Unknown reaction   Aspirin  Other (See Comments)    Unknown reaction   Duloxetine Hcl Other (See Comments)    Unknown reaction   Ventolin [Albuterol] Other (See Comments)    rapid heart beat   Cefadroxil Hives    Patient can take amoxicillin and cipro    No current facility-administered medications on file prior to encounter.   Current Outpatient Medications on File Prior to Encounter  Medication Sig Dispense Refill   acetaminophen (TYLENOL) 500 MG tablet Take 1,000 mg by mouth 2 (two) times daily.     albuterol (VENTOLIN HFA) 108 (90 Base) MCG/ACT inhaler Inhale 2 puffs into the lungs every 4 (four) hours as needed for wheezing or shortness of breath.     alendronate (FOSAMAX) 70 MG tablet Take 70 mg by mouth every Sunday. Take with a full glass of water on an empty stomach.     Ascorbic Acid (VITAMIN C) 1000 MG tablet Take 1,000 mg by mouth every morning.     Cholecalciferol (VITAMIN D3) 50 MCG (2000 UT) TABS Take 2,000 Units by mouth every morning.     docusate sodium (COLACE) 100 MG capsule Take 200 mg by mouth at bedtime.     dorzolamide-timolol (COSOPT) 22.3-6.8 MG/ML ophthalmic solution Place 1 drop into both eyes 2 (two) times daily.     ferrous gluconate (FERGON) 240 (27 FE) MG tablet Take 240 mg by mouth every morning. Take without food     HYDROcodone-acetaminophen (NORCO/VICODIN) 5-325 MG tablet Take 1 tablet by mouth every 6 (six) hours as needed (pain).     hydrocortisone (ANUSOL-HC) 2.5 % rectal cream Place 1 Application rectally 2 (two) times daily for 7 days. 20 g 0   latanoprost (XALATAN) 0.005 % ophthalmic solution Place 1 drop into both eyes at bedtime.     loratadine (CLARITIN) 10 MG tablet Take 1 tablet (10 mg total) by mouth daily. 30 tablet 0   Multiple Vitamins-Minerals (PRESERVISION AREDS 2) CAPS Take 1 capsule by mouth 2 (two) times daily.     MYRBETRIQ 25 MG TB24 tablet Take 25 mg by mouth daily.     Netarsudil Dimesylate (RHOPRESSA) 0.02 % SOLN Place 1 drop into both eyes at bedtime.      pantoprazole (PROTONIX) 40 MG tablet Take 40 mg by mouth 2 (two) times daily.     polyethylene glycol (MIRALAX / GLYCOLAX) 17 g packet Take 8.5 g by mouth daily as needed (constipation).     Probiotic Product (ALIGN) 4 MG CAPS Take 4 mg by mouth at bedtime.     propranolol (INDERAL) 20 MG tablet Take 20 mg by mouth See admin instructions. Take one tablet (20 mg) by mouth daily at bedtime, may also take one tablet (20 mg) daily as needed for pulse over 120     rOPINIRole (REQUIP) 1 MG tablet Take 0.5 mg by mouth at bedtime as needed (  restless legs/tremors).     sertraline (ZOLOFT) 100 MG tablet Take 1 tablet (100 mg total) by mouth daily. 30 tablet 3   simvastatin (ZOCOR) 20 MG tablet Take 20 mg by mouth daily.     Sodium Fluoride (PREVIDENT 5000 BOOSTER PLUS) 1.1 % PSTE Place 1 Application onto teeth See admin instructions. Brush on teeth with a toothbrush after evening mouth care. Spit out excess and do not rinse.     umeclidinium-vilanterol (ANORO ELLIPTA) 62.5-25 MCG/ACT AEPB Inhale 1 puff into the lungs every morning.     zinc oxide 20 % ointment Apply 1 Application topically See admin instructions. Apply topically to buttocks after every incontinent episode and as needed for redness     OXYGEN 2lpm with sleep only  [facility]       Results for orders placed or performed during the hospital encounter of 05/02/22 (from the past 48 hour(s))  Comprehensive metabolic panel     Status: Abnormal   Collection Time: 05/02/22  6:39 PM  Result Value Ref Range   Sodium 139 135 - 145 mmol/L   Potassium 3.8 3.5 - 5.1 mmol/L   Chloride 107 98 - 111 mmol/L   CO2 26 22 - 32 mmol/L   Glucose, Bld 129 (H) 70 - 99 mg/dL    Comment: Glucose reference range applies only to samples taken after fasting for at least 8 hours.   BUN 24 (H) 8 - 23 mg/dL   Creatinine, Ser 0.53 0.44 - 1.00 mg/dL   Calcium 8.6 (L) 8.9 - 10.3 mg/dL   Total Protein 6.1 (L) 6.5 - 8.1 g/dL   Albumin 3.5 3.5 - 5.0 g/dL   AST 18 15 -  41 U/L   ALT 14 0 - 44 U/L   Alkaline Phosphatase 46 38 - 126 U/L   Total Bilirubin 0.4 0.3 - 1.2 mg/dL   GFR, Estimated >60 >60 mL/min    Comment: (NOTE) Calculated using the CKD-EPI Creatinine Equation (2021)    Anion gap 6 5 - 15    Comment: Performed at [facility], [place] [address] [place], [place] [address]  CBC     Status: Abnormal   Collection Time: 05/02/22  6:39 PM  Result Value Ref Range   WBC 6.4 4.0 - 10.5 K/uL   RBC 2.84 (L) 3.87 - 5.11 MIL/uL   Hemoglobin 8.9 (L) 12.0 - 15.0 g/dL   HCT 27.5 (L) 36.0 - 46.0 %   MCV 96.8 80.0 - 100.0 fL   MCH 31.3 26.0 - 34.0 pg   MCHC 32.4 30.0 - 36.0 g/dL   RDW 14.6 11.5 - 15.5 %   Platelets 151 150 - 400 K/uL   nRBC 0.0 0.0 - 0.2 %    Comment: Performed at [facility], [place] [address] [place], [place] [address]  Type and screen [place]     Status: None (Preliminary result)   Collection Time: 05/02/22  6:39 PM  Result Value Ref Range   ABO/RH(D) O POS    Antibody Screen NEG    Sample Expiration 05/05/2022,2359    Unit Number [id number]    Blood Component Type RED CELLS,LR    Unit division 00    Status of Unit ISSUED    Transfusion Status OK TO TRANSFUSE    Crossmatch Result Compatible    Unit Number [id number]    Blood Component Type RED CELLS,LR    Unit division 00    Status of Unit ISSUED  Transfusion Status OK TO TRANSFUSE    Crossmatch Result Compatible    Unit Number [id number]    Blood Component Type RED CELLS,LR    Unit division 00    Status of Unit ISSUED    Transfusion Status OK TO TRANSFUSE    Crossmatch Result Compatible    Unit Number [id number]    Blood Component Type RED CELLS,LR    Unit division 00    Status of Unit ALLOCATED    Transfusion Status OK TO TRANSFUSE    Crossmatch Result      Compatible Performed at [place] [address] [place], [place] [address]    Unit Number [id number]     Blood Component Type RED CELLS,LR    Unit division 00    Status of Unit ALLOCATED    Transfusion Status OK TO TRANSFUSE    Crossmatch Result Compatible   Hemoglobin and hematocrit, blood     Status: Abnormal   Collection Time: 05/02/22 10:45 PM  Result Value Ref Range   Hemoglobin 7.7 (L) 12.0 - 15.0 g/dL   HCT 24.1 (L) 36.0 - 46.0 %    Comment: Performed at [facility], [place] [address] [place], [place] [address]  Prepare RBC (crossmatch)     Status: None   Collection Time: 05/02/22 11:34 PM  Result Value Ref Range   Order Confirmation      ORDER PROCESSED BY BLOOD BANK Performed at [facility], [place] [address], [place], [place] [address]   POC occult blood, ED     Status: Abnormal   Collection Time: 05/03/22 12:39 AM  Result Value Ref Range   Fecal Occult Bld POSITIVE (A) NEGATIVE  Hemoglobin and hematocrit, blood     Status: Abnormal   Collection Time: 05/03/22  1:06 AM  Result Value Ref Range   Hemoglobin 7.1 (L) 12.0 - 15.0 g/dL   HCT 22.4 (L) 36.0 - 46.0 %    Comment: Performed at [facility], [place] [address], [place], [place] [address]  Basic metabolic panel     Status: Abnormal   Collection Time: 05/03/22  7:30 AM  Result Value Ref Range   Sodium 140 135 - 145 mmol/L   Potassium 3.6 3.5 - 5.1 mmol/L   Chloride 111 98 - 111 mmol/L   CO2 24 22 - 32 mmol/L   Glucose, Bld 114 (H) 70 - 99 mg/dL    Comment: Glucose reference range applies only to samples taken after fasting for at least 8 hours.   BUN 24 (H) 8 - 23 mg/dL   Creatinine, Ser 0.32 (L) 0.44 - 1.00 mg/dL   Calcium 7.8 (L) 8.9 - 10.3 mg/dL   GFR, Estimated >60 >60 mL/min    Comment: (NOTE) Calculated using the CKD-EPI Creatinine Equation (2021)    Anion gap 5 5 - 15    Comment: Performed at [facility], [place] [address] [place], [place] [address]  CBC     Status: Abnormal   Collection Time: 05/03/22  7:30 AM  Result Value Ref Range    WBC 5.9 4.0 - 10.5 K/uL   RBC 2.50 (L) 3.87 - 5.11 MIL/uL   Hemoglobin 8.0 (L) 12.0 - 15.0 g/dL   HCT 23.9 (L) 36.0 - 46.0 %   MCV 95.6 80.0 - 100.0 fL   MCH 32.0 26.0 - 34.0 pg   MCHC 33.5 30.0 - 36.0 g/dL   RDW 14.7 11.5 - 15.5 %  Platelets 116 (L) 150 - 400 K/uL   nRBC 0.0 0.0 - 0.2 %    Comment: Performed at [facility], [place] [address] [place], [place] [address]  Vitamin B12     Status: None   Collection Time: 05/03/22 10:33 AM  Result Value Ref Range   Vitamin B-12 412 180 - 914 pg/mL    Comment: (NOTE) This assay is not validated for testing neonatal or myeloproliferative syndrome specimens for Vitamin B12 levels. Performed at [facility], [place] [address] [place], [place] [address]   Folate     Status: None   Collection Time: 05/03/22 10:33 AM  Result Value Ref Range   Folate 28.7 >5.9 ng/mL    Comment: RESULT CONFIRMED BY MANUAL DILUTION Performed at [facility], [place] [address] [place], [place] [address]   Iron and TIBC     Status: Abnormal   Collection Time: 05/03/22 10:33 AM  Result Value Ref Range   Iron 143 28 - 170 ug/dL   TIBC 258 250 - 450 ug/dL   Saturation Ratios 56 (H) 10.4 - 31.8 %   UIBC 115 ug/dL    Comment: Performed at [facility], [place] [address], [place], [place] [address]  Ferritin     Status: None   Collection Time: 05/03/22 10:33 AM  Result Value Ref Range   Ferritin 34 11 - 307 ng/mL    Comment: Performed at [facility], [place] [address] [place], [place] [address]  Reticulocytes     Status: Abnormal   Collection Time: 05/03/22 10:33 AM  Result Value Ref Range   Retic Ct Pct 2.2 0.4 - 3.1 %   RBC. 2.45 (L) 3.87 - 5.11 MIL/uL   Retic Count, Absolute 52.7 19.0 - 186.0 K/uL   Immature Retic Fract 14.4 2.3 - 15.9 %    Comment: Performed at [facility], [place] [address] [place], [place] [address]  Prepare RBC (crossmatch)      Status: None   Collection Time: 05/03/22 10:49 AM  Result Value Ref Range   Order Confirmation      ORDER PROCESSED BY BLOOD BANK Performed at [facility], [place] [address], [place], [place] [address]   Prepare RBC (crossmatch)     Status: None   Collection Time: 05/03/22 10:49 AM  Result Value Ref Range   Order Confirmation      ORDER PROCESSED BY BLOOD BANK Performed at [facility], [place] [address], [place], [place] [address]   CBC with Differential/Platelet     Status: Abnormal   Collection Time: 05/03/22  6:30 PM  Result Value Ref Range   WBC 7.1 4.0 - 10.5 K/uL   RBC 2.59 (L) 3.87 - 5.11 MIL/uL   Hemoglobin 7.7 (L) 12.0 - 15.0 g/dL   HCT 23.8 (L) 36.0 - 46.0 %   MCV 91.9 80.0 - 100.0 fL   MCH 29.7 26.0 - 34.0 pg   MCHC 32.4 30.0 - 36.0 g/dL   RDW 17.7 (H) 11.5 - 15.5 %   Platelets 106 (L) 150 - 400 K/uL   nRBC 0.0 0.0 - 0.2 %   Neutrophils Relative % 73 %   Neutro Abs 5.1 1.7 - 7.7 K/uL   Lymphocytes Relative 14 %   Lymphs Abs 1.0 0.7 - 4.0 K/uL   Monocytes Relative 11 %   Monocytes Absolute 0.8 0.1 - 1.0 K/uL   Eosinophils Relative 2 %  Eosinophils Absolute 0.1 0.0 - 0.5 K/uL   Basophils Relative 0 %   Basophils Absolute 0.0 0.0 - 0.1 K/uL   Immature Granulocytes 0 %   Abs Immature Granulocytes 0.03 0.00 - 0.07 K/uL    Comment: Performed at [facility], [place] [address] [place], [place] [address]  Prepare RBC (crossmatch)     Status: None   Collection Time: 05/03/22  7:05 PM  Result Value Ref Range   Order Confirmation      ORDER PROCESSED BY BLOOD BANK Performed at [facility], [place] [address], [place], [place] [address]   Hemoglobin and hematocrit, blood     Status: Abnormal   Collection Time: 05/03/22  9:21 PM  Result Value Ref Range   Hemoglobin 7.4 (L) 12.0 - 15.0 g/dL   HCT 23.3 (L) 36.0 - 46.0 %    Comment: Performed at [facility], [place] [address],  [place], [place] [address]  Basic metabolic panel     Status: Abnormal   Collection Time: 05/03/22  9:21 PM  Result Value Ref Range   Sodium 138 135 - 145 mmol/L   Potassium 3.6 3.5 - 5.1 mmol/L   Chloride 111 98 - 111 mmol/L   CO2 22 22 - 32 mmol/L   Glucose, Bld 110 (H) 70 - 99 mg/dL    Comment: Glucose reference range applies only to samples taken after fasting for at least 8 hours.   BUN 20 8 - 23 mg/dL   Creatinine, Ser 0.32 (L) 0.44 - 1.00 mg/dL   Calcium 7.6 (L) 8.9 - 10.3 mg/dL   GFR, Estimated >60 >60 mL/min    Comment: (NOTE) Calculated using the CKD-EPI Creatinine Equation (2021)    Anion gap 5 5 - 15    Comment: Performed at [facility], [place] [address] [place], [place] [address]  MRSA Next Gen by PCR, Nasal     Status: None   Collection Time: 05/03/22 10:39 PM   Specimen: Nasal Mucosa; Nasal Swab  Result Value Ref Range   MRSA by PCR Next Gen NOT DETECTED NOT DETECTED    Comment: (NOTE) The GeneXpert MRSA Assay (FDA approved for NASAL specimens only), is one component of a comprehensive MRSA colonization surveillance program. It is not intended to diagnose MRSA infection nor to guide or monitor treatment for MRSA infections. Test performance is not FDA approved in patients less than 67 years old. Performed at [facility], [place] [address], [place], [place] [address]   Hemoglobin and hematocrit, blood     Status: Abnormal   Collection Time: 05/04/22  1:45 AM  Result Value Ref Range   Hemoglobin 8.1 (L) 12.0 - 15.0 g/dL   HCT 24.3 (L) 36.0 - 46.0 %    Comment: Performed at [facility], [place] [address] [place], [place] [address]    NM GI Blood Loss  Result Date: 05/03/2022 CLINICAL DATA:  GI bleed EXAM: NUCLEAR MEDICINE GASTROINTESTINAL BLEEDING SCAN TECHNIQUE: Sequential abdominal images were obtained following intravenous administration of Tc-54mlabeled red blood cells. RADIOPHARMACEUTICALS:  20.5 mCi Tc-944mpertechnetate in-vitro labeled red cells. COMPARISON:  CT 05/03/2022, 12/19/2020 FINDINGS: Sequential images were obtained over the course of 1 hour. Later images are degraded by patient motion. At the start of the exam, there is faint activity present within the distal transverse colon, splenic flexure and descending colon. There is progressive activity at the splenic flexure and descending colon. IMPRESSION: Motion degradation. Positive  for active GI bleeding involving the lower GI tract. Exact site difficult to pinpoint but suspected source is splenic flexure of the colon. These results will be called to the ordering clinician or representative by the Radiologist Assistant, and communication documented in the PACS or [employer]. Electronically Signed   By: Donavan Foil M.D.   On: 05/03/2022 18:04   CT ANGIO GI BLEED  Result Date: 05/03/2022 CLINICAL DATA:  History of diverticular bleeding requiring embolization presents with rectal bleeding. EXAM: CTA ABDOMEN AND PELVIS WITHOUT AND WITH CONTRAST TECHNIQUE: Multidetector CT imaging of the abdomen and pelvis was performed using the standard protocol during bolus administration of intravenous contrast. Multiplanar reconstructed images and MIPs were obtained and reviewed to evaluate the vascular anatomy. RADIATION DOSE REDUCTION: This exam was performed according to the departmental dose-optimization program which includes automated exposure control, adjustment of the mA and/or kV according to patient size and/or use of iterative reconstruction technique. CONTRAST:  <See Chart> OMNIPAQUE IOHEXOL 350 MG/ML SOLN COMPARISON:  Same-day CTA abdomen/pelvis. FINDINGS: VASCULAR Aorta: Moderate atherosclerotic plaque is again seen in the nonaneurysmal abdominal aorta. There is no evidence of dissection, vasculitis, or significant stenosis. Celiac: Patent without evidence of aneurysm, dissection, vasculitis or significant stenosis. SMA: Patent without evidence of  aneurysm, dissection, vasculitis or significant stenosis. Renals: There is atherosclerotic plaque of the origin of the renal arteries bilaterally resulting in moderate to severe stenosis bilaterally, right worse than left. The renal arteries are patent distally. There is no evidence of aneurysm, dissection, vasculitis, or fibromuscular dysplasia. IMA: Patent without evidence of aneurysm, dissection, vasculitis or significant stenosis. Inflow: Choose Proximal Outflow: Bilateral common femoral and visualized portions of the superficial and profunda femoral arteries are patent without evidence of aneurysm, dissection, vasculitis or significant stenosis. Veins: The main portal and splenic veins are patent. There is no evidence of active extravasation. Review of the MIP images confirms the above findings. NON-VASCULAR Lower chest: The lung bases are clear. Coronary artery calcification is again seen. Hepatobiliary: The liver and gallbladder are unremarkable. Pancreas: Unremarkable. Spleen: Unremarkable. Adrenals/Urinary Tract: Adrenals are unremarkable. A left calyceal diverticulum is noted with layering contrast dependently. There is no hydronephrosis or hydroureter. There are no suspicious renal lesions. There are no stones. Excreted contrast is seen in the otherwise normal urinary bladder. Stomach/Bowel: The stomach is unremarkable. There is no evidence of bowel obstruction. 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 No active extravasation is seen along the GI tract. Embolization coils are seen in the left upper quadrant presumably related to prior diverticular bleed treatment. Appendix is normal. Lymphatic: There is no abdominal or pelvic lymphadenopathy. Reproductive: The uterus is unremarkable.  There is no adnexal mass. Other: There is no ascites or free air. Musculoskeletal: Severe compression deformity of the L1 vertebral body with vertebra plana is unchanged. There is  no acute osseous abnormality or suspicious osseous lesion. IMPRESSION: No evidence of active GI bleed or significant change compared to the study from earlier the same day. Electronically Signed   By: Valetta Mole M.D.   On: 05/03/2022 12:13   CT ANGIO GI BLEED  Result Date: 05/03/2022 CLINICAL DATA:  Lower GI bleed. EXAM: CTA ABDOMEN AND PELVIS WITHOUT AND WITH CONTRAST TECHNIQUE: Multidetector CT imaging of the abdomen and pelvis was performed using the standard protocol during bolus administration of intravenous contrast. Multiplanar reconstructed images and MIPs were obtained and reviewed to evaluate the vascular anatomy. RADIATION DOSE REDUCTION: This exam was performed according to the departmental  dose-optimization program which includes automated exposure control, adjustment of the mA and/or kV according to patient size and/or use of iterative reconstruction technique. CONTRAST:  25m OMNIPAQUE IOHEXOL 350 MG/ML SOLN COMPARISON:  GI bleed study dated 12/19/2020. FINDINGS: Evaluation is limited due to streak artifact caused by patient's arms and overlying support wires. VASCULAR Aorta: Moderate atherosclerotic calcification of the aorta. No aneurysmal dilatation or dissection. No periaortic fluid collection. Celiac: Patent without evidence of aneurysm, dissection, vasculitis or significant stenosis. SMA: Patent without evidence of aneurysm, dissection, vasculitis or significant stenosis. Renals: Atherosclerotic calcification of the origins of the renal arteries. The renal arteries remain patent. IMA: Patent without evidence of aneurysm, dissection, vasculitis or significant stenosis. Inflow: Mild atherosclerotic calcification of the iliac arteries. The iliac arteries are patent. No aneurysmal dilatation or dissection. Proximal Outflow: The visualized proximal femoral arteries are patent. Veins: The IVC is unremarkable. The SMV, splenic vein, and main portal vein are patent. No portal venous gas. Review of  the MIP images confirms the above findings. NON-VASCULAR Lower chest: The visualized lung bases are clear. There is coronary vascular calcification. No intra-abdominal free air or free fluid. Hepatobiliary: The liver is unremarkable. Mild biliary ductal dilatation or mild periportal edema. There is small amount of layering sludge or small stones within the gallbladder. No pericholecystic fluid or evidence of acute cholecystitis by CT. Pancreas: Unremarkable. No pancreatic ductal dilatation or surrounding inflammatory changes. Spleen: Normal in size without focal abnormality. Adrenals/Urinary Tract: The adrenal glands unremarkable. There is no hydronephrosis or nephrolithiasis on either side. There is a 2.7 cm left renal upper pole parapelvic cyst. The visualized ureters and urinary bladder appear unremarkable. Stomach/Bowel: There is severe diverticulosis of the distal colon without active inflammatory changes. There is no bowel obstruction or active inflammation. No evidence of active GI bleed. The appendix is normal. Lymphatic: No adenopathy. Reproductive: The uterus is grossly unremarkable. No adnexal masses. Other: None Musculoskeletal: Osteopenia with degenerative changes of the spine. No acute osseous pathology. IMPRESSION: 1. No acute intra-abdominal or pelvic pathology. No evidence of active GI bleed. 2. Severe diverticulosis of the distal colon without active inflammatory changes. No bowel obstruction. Normal appendix. Electronically Signed   By: AAnner CreteM.D.   On: 05/03/2022 01:12    Review of Systems  Constitutional:  Positive for fatigue.  Musculoskeletal:  Positive for neck pain.  All other systems reviewed and are negative.  Blood pressure (!) 126/46, pulse 100, temperature 97.8 F (36.6 C), resp. rate (!) 21, height '5\' 1"'$  (1.549 m), weight 77.7 kg, SpO2 96 %. Physical Exam Vitals reviewed.  Constitutional:      Appearance: She is obese.  Eyes:     General: No scleral  icterus. Cardiovascular:     Rate and Rhythm: Normal rate and regular rhythm.  Pulmonary:     Effort: Pulmonary effort is normal.  Abdominal:     General: There is no distension.     Palpations: Abdomen is soft.     Tenderness: There is no abdominal tenderness.  Skin:    Capillary Refill: Capillary refill takes less than 2 seconds.  Neurological:     General: No focal deficit present.     Mental Status: She is alert.     Assessment/Plan: GI bleed -no indication for surgery right now, needs to be localized- bleeding scan is not enough to operate on her for localization -colonoscopy today   MRolm Bookbinder11/09/2021, 7:33 AM

## 2022-05-04 NOTE — Progress Notes (Signed)
Interventional Radiology Brief Note:  VIR consulted overnight for evaluation. Patient with negative CTA x2, however a NM GIB study was ultimately positive likely indicating intermittent bleeding. 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  Patient also being evaluated by GI as well as general surgery.  She is now scheduled for colonoscopy today at 1130.    Please re-consult IR if patient decompensates or there is further indication of acute, brisk bleed amenable to angiogram/embolization.   Brynda Greathouse, MS RD PA-C

## 2022-05-04 NOTE — Anesthesia Preprocedure Evaluation (Addendum)
Anesthesia Evaluation  Patient identified by MRN, date of birth, ID band Patient awake    Reviewed: Allergy & Precautions, NPO status , Patient's Chart, lab work & pertinent test results  History of Anesthesia Complications Negative for: history of anesthetic complications  Airway Mallampati: I  TM Distance: >3 FB Neck ROM: Full    Dental  (+) Partial Upper, Partial Lower, Dental Advisory Given   Pulmonary COPD,  oxygen dependent, former smoker   Pulmonary exam normal        Cardiovascular hypertension, Pt. on home beta blockers Normal cardiovascular exam     Neuro/Psych  Headaches PSYCHIATRIC DISORDERS Anxiety Depression       GI/Hepatic Neg liver ROS,GERD  Medicated,,GI bleed    Endo/Other  negative endocrine ROS    Renal/GU negative Renal ROS     Musculoskeletal  (+) Arthritis ,    Abdominal   Peds  Hematology  (+) Blood dyscrasia (Thrombocytopenia), anemia   Anesthesia Other Findings Day of surgery medications reviewed with the patient.  Reproductive/Obstetrics                             Anesthesia Physical Anesthesia Plan  ASA: 3  Anesthesia Plan: MAC   Post-op Pain Management: Minimal or no pain anticipated   Induction: Intravenous  PONV Risk Score and Plan: 2 and TIVA and Treatment may vary due to age or medical condition  Airway Management Planned: Natural Airway and Nasal Cannula  Additional Equipment:   Intra-op Plan:   Post-operative Plan:   Informed Consent: I have reviewed the patients History and Physical, chart, labs and discussed the procedure including the risks, benefits and alternatives for the proposed anesthesia with the patient or authorized representative who has indicated his/her understanding and acceptance.     Dental advisory given  Plan Discussed with: Anesthesiologist and CRNA  Anesthesia Plan Comments:         Anesthesia Quick  Evaluation

## 2022-05-04 NOTE — Progress Notes (Signed)
Patient picked up by ENDO RN and transported, vital signs stable. Upper partials and hearing aid removed and stored.

## 2022-05-04 NOTE — Op Note (Signed)
[facility] Patient Name: [name] Procedure Date: 05/04/2022 MRN: [id number] Attending MD: Ronnette Juniper , MD, [id number] Date of Birth: 01/03/1941 CSN: [id number] Age: 81 Admit Type: Inpatient Procedure:                Colonoscopy Indications:              Last colonoscopy: 2005, Hematochezia Providers:                Ronnette Juniper, MD, Doristine Johns, RN, Cherylynn Ridges, Technician, Wilsey Alday CRNA, CRNA Referring MD:             [facility] Medicines:                Monitored Anesthesia Care Complications:            No immediate complications. Estimated Blood Loss:     Estimated blood loss: none. Procedure:                Pre-Anesthesia Assessment:                           - Prior to the procedure, a History and Physical                            was performed, and patient medications and                            allergies were reviewed. The patient's tolerance of                            previous anesthesia was also reviewed. The risks                            and benefits of the procedure and the sedation                            options and risks were discussed with the patient.                            All questions were answered, and informed consent                            was obtained. Prior Anticoagulants: The patient has                            taken no anticoagulant or antiplatelet agents. ASA                            Grade Assessment: III - A patient with severe                            systemic disease. After reviewing the risks and  benefits, the patient was deemed in satisfactory                            condition to undergo the procedure.                           After obtaining informed consent, the colonoscope                            was passed under direct vision. Throughout the                            procedure, the patient's blood pressure, pulse, and                             oxygen saturations were monitored continuously. The                            PCF-HQ190L ([id number]) Olympus colonoscope was                            introduced through the anus and advanced to the the                            cecum, identified by appendiceal orifice and                            ileocecal valve. The colonoscopy was performed                            without difficulty. The patient tolerated the                            procedure well. The quality of the bowel                            preparation was poor. The ileocecal valve,                            appendiceal orifice, and rectum were photographed. Scope In: 16:96:78 PM Scope Out: 12:36:02 PM Scope Withdrawal Time: 0 hours 21 minutes 58 seconds  Total Procedure Duration: 0 hours 27 minutes 21 seconds  Findings:      The perianal and digital rectal examinations were normal.      Hematin (altered blood/coffee-ground-like material) was found in the       entire colon.      Multiple large-mouthed, medium-mouthed and small-mouthed diverticula       were found in the sigmoid colon, descending colon, splenic flexure,       transverse colon, hepatic flexure and ascending colon.      Two sessile polyps were found in the transverse colon. The polyps were 8       to 12 mm in size. These polyps were removed with a piecemeal technique       using a hot snare. Resection and retrieval were complete.  There was no evidence of active bleeding on thorough lavage. Impression:               - Preparation of the colon was poor.                           - Blood in the entire examined colon.                           - Diverticulosis in the sigmoid colon, in the                            descending colon, at the splenic flexure, in the                            transverse colon, at the hepatic flexure and in the                            ascending colon.                           - Two 8 to  12 mm polyps in the transverse colon,                            removed piecemeal using a hot snare. Resected and                            retrieved. Moderate Sedation:      Patient did not receive moderate sedation for this procedure, but       instead received monitored anesthesia care. Recommendation:           - High fiber diet.                           - Continue present medications.                           - Repeat colonoscopy is not recommended due to age.                           - Await pathology results. Procedure Code(s):        --- Professional ---                           [phone], Colonoscopy, flexible; with removal of                            tumor(s), polyp(s), or other lesion(s) by snare                            technique Diagnosis Code(s):        --- Professional ---                           K92.2, Gastrointestinal hemorrhage, unspecified  D12.3, Benign neoplasm of transverse colon (hepatic                            flexure or splenic flexure)                           K92.1, Melena (includes Hematochezia)                           K57.30, Diverticulosis of large intestine without                            perforation or abscess without bleeding CPT copyright 2022 [employer]. All rights reserved. The codes documented in this report are preliminary and upon coder review may  be revised to meet current compliance requirements. Ronnette Juniper, MD 05/04/2022 12:43:25 PM This report has been signed electronically. Number of Addenda: 0

## 2022-05-04 NOTE — Progress Notes (Signed)
[facility]                                                                               [name], is a 81 y.o. female, DOB - 11/04/1940, LDJ:[id number] Admit date - 05/02/2022    Outpatient Primary MD for the patient is Cleophas Dunker, [name], NP  LOS - 1  days    Brief summary   [name] is a 81 y.o. 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   Patient reports having brisk rectal bleeding for the past 3 to 4 days when having a bowel movement.  Has felt increased abdominal distention but no pain.  No nausea or vomiting. She has history of diverticular bleed in 11/2020 s/p embolization with recurrence on 05/2021 that did not require intervention.  She was then admitted in 12/2021 requiring transfusion.  She underwent EGD with no active signs of bleeding.   She followed up with her PCP  on 05/02/2022 and was noted to have downward trending hemoglobin of 11.2 yesterday down to 10.2 today with continuous rectal bleeding and was advised to present to the ED. She continued to have rectal bleeding and her hemoglobin dropped to 7.4, 2 units of prbc transfusion given in the last 24 hours.  Hemoglobin at 7.3 today. She also underwent colonoscopy this morning.     Assessment & Plan    Assessment and Plan:*    Acute GI bleeding -history of diverticular bleed in 11/2020 s/p embolization with recurrence on 05/2021 that did not require intervention.  She was then admitted in 12/2021 with recurrent GI bleed requiring transfusion.   She underwent EGD with no active signs of bleeding. -Hgb has downward trend from 11.2 (11/1) --> 10.2 --> 8.9 on presentation -->7.7. to 7.1 to 8  to 7.4 to 7.6 to 7.3.  -Guiac positive stools.  - s/p a total of 3 units of Prbc transfusion. 1 more unit of prbc ordered. Anemia panel unremarkable.  - stat CT angio of the  abdomen twice did not show nay evidence of acute GI bleed. Severe diverticulosis of the distal colon without active inflammatory changes. No bowel obstruction. - NM bleeding scan showed active bleed at the site of splenic flexure of the colon . Discussed with IR on call Dr Maryelizabeth Kaufmann,  bases on previous diverticular bleed and history , suggested intermittent bleeding, recommended to observe overnight and if she bleeds or becomes unstable , to go ahead with CTA and call IR for embolization.  - she underwent colonoscopy showing blood in the entire colon. Prep was poor. Diffuse diverticulosis. 2 polyps from the transverse colon removed. - General surgery consulted to see if she is a candidate for colectomy.      Hypotension BP parameters still borderline. Keep MAP>65 mmHg.  Holding home BP meds.   Restless leg syndrome Continue home meds  COPD GOLD II  Stable.  Continue home bronchodilator. No wheezing heard on exam.  - Continue nocturnal O2 2 L     Estimated body mass index is 32.37 kg/m as calculated from the following:  Height as of this encounter: '5\' 1"'$  (1.549 m).   Weight as of this encounter: 77.7 kg.  Code Status: full code.  DVT Prophylaxis:  SCDs Start: 05/02/22 2316   Level of Care: Level of care: ICU Family Communication: none at bedside.   Disposition Plan:     Remains inpatient appropriate: GI work up for blood oss anemia.   Procedures:  None.  Consultants:   GASTROENTEROLOGY  Antimicrobials:   Anti-infectives (From admission, onward)    None        Medications  Scheduled Meds:  sodium chloride   Intravenous Once   sodium chloride   Intravenous Once   sodium chloride   Intravenous Once   acetaminophen  1,000 mg Oral BID   acidophilus  1 capsule Oral QHS   vitamin C  1,000 mg Oral q morning   Chlorhexidine Gluconate Cloth  6 each Topical Daily   cholecalciferol  2,000 Units Oral q morning   dorzolamide-timolol  1 drop Both Eyes BID   ferrous  gluconate  324 mg Oral q morning   latanoprost  1 drop Both Eyes QHS   loratadine  10 mg Oral Daily   melatonin  3 mg Oral Once   mirabegron ER  25 mg Oral Daily   Netarsudil Dimesylate  1 drop Both Eyes QHS   pantoprazole (PROTONIX) IV  40 mg Intravenous Q12H   sertraline  100 mg Oral Daily   simvastatin  20 mg Oral Daily   umeclidinium-vilanterol  1 puff Inhalation Daily   Continuous Infusions:  lactated ringers 0 mL/hr at 05/04/22 0645   potassium chloride 10 mEq (05/04/22 1530)   potassium chloride     PRN Meds:.albuterol, mouth rinse, potassium chloride, rOPINIRole    Subjective:   Basya Casavant was seen and examined today.  No new complaints. No more bleeding this afternoon after the scope.  Objective:   Vitals:   05/04/22 1324 05/04/22 1330 05/04/22 1400 05/04/22 1500  BP:  (!) 110/33 (!) 108/45 (!) 98/30  Pulse:  97 86 74  Resp:  '17 16 16  '$ Temp: (!) 97.5 F (36.4 C)     TempSrc: Oral     SpO2:  96% 96% 96%  Weight:      Height:        Intake/Output Summary (Last 24 hours) at 05/04/2022 1538 Last data filed at 05/04/2022 1500 Gross per 24 hour  Intake 3950.57 ml  Output 500 ml  Net 3450.57 ml    Filed Weights   05/03/22 0344 05/03/22 2230  Weight: 78.3 kg 77.7 kg     Exam General exam: Appears calm and comfortable  Respiratory system: Clear to auscultation. Respiratory effort normal. Cardiovascular system: S1 & S2 heard, RRR. No JVD, No pedal edema. Gastrointestinal system: Abdomen is nondistended, soft and nontender.  Normal bowel sounds heard. Central nervous system: Alert and oriented. No focal neurological deficits. Extremities: Symmetric 5 x 5 power. Skin: No rashes, lesions or ulcers Psychiatry:  Mood & affect appropriate.    Data Reviewed:  I have personally reviewed following labs and imaging studies   CBC Lab Results  Component Value Date   WBC 7.0 05/04/2022   RBC 2.41 (L) 05/04/2022   HGB 7.6 (L) 05/04/2022   HCT 23.1 (L)  05/04/2022   MCV 95.9 05/04/2022   MCH 31.5 05/04/2022   PLT 101 (L) 05/04/2022   MCHC 32.9 05/04/2022   RDW 17.0 (H) 05/04/2022   LYMPHSABS 1.3 05/04/2022   MONOABS 0.9 05/04/2022  EOSABS 0.2 05/04/2022   BASOSABS 0.0 25/10/3974     Last metabolic panel Lab Results  Component Value Date   NA 140 05/04/2022   K 3.2 (L) 05/04/2022   CL 113 (H) 05/04/2022   CO2 22 05/04/2022   BUN 20 05/04/2022   CREATININE 0.34 (L) 05/04/2022   GLUCOSE 108 (H) 05/04/2022   GFRNONAA >60 05/04/2022   GFRAA >60 09/14/2017   CALCIUM 7.3 (L) 05/04/2022   PHOS 3.2 05/05/2021   PROT 6.1 (L) 05/02/2022   ALBUMIN 3.5 05/02/2022   BILITOT 0.4 05/02/2022   ALKPHOS 46 05/02/2022   AST 18 05/02/2022   ALT 14 05/02/2022   ANIONGAP 5 05/04/2022    CBG (last 3)  No results for input(s): "GLUCAP" in the last 72 hours.    Coagulation Profile: No results for input(s): "INR", "PROTIME" in the last 168 hours.   Radiology Studies: NM GI Blood Loss  Result Date: 05/03/2022 CLINICAL DATA:  GI bleed EXAM: NUCLEAR MEDICINE GASTROINTESTINAL BLEEDING SCAN TECHNIQUE: Sequential abdominal images were obtained following intravenous administration of Tc-43mlabeled red blood cells. RADIOPHARMACEUTICALS:  20.5 mCi Tc-933mertechnetate in-vitro labeled red cells. COMPARISON:  CT 05/03/2022, 12/19/2020 FINDINGS: Sequential images were obtained over the course of 1 hour. Later images are degraded by patient motion. At the start of the exam, there is faint activity present within the distal transverse colon, splenic flexure and descending colon. There is progressive activity at the splenic flexure and descending colon. IMPRESSION: Motion degradation. Positive for active GI bleeding involving the lower GI tract. Exact site difficult to pinpoint but suspected source is splenic flexure of the colon. These results will be called to the ordering clinician or representative by the Radiologist Assistant, and communication  documented in the PACS or ClFrontier Oil CorporationElectronically Signed   By: KiDonavan Foil.D.   On: 05/03/2022 18:04   CT ANGIO GI BLEED  Result Date: 05/03/2022 CLINICAL DATA:  History of diverticular bleeding requiring embolization presents with rectal bleeding. EXAM: CTA ABDOMEN AND PELVIS WITHOUT AND WITH CONTRAST TECHNIQUE: Multidetector CT imaging of the abdomen and pelvis was performed using the standard protocol during bolus administration of intravenous contrast. Multiplanar reconstructed images and MIPs were obtained and reviewed to evaluate the vascular anatomy. RADIATION DOSE REDUCTION: This exam was performed according to the departmental dose-optimization program which includes automated exposure control, adjustment of the mA and/or kV according to patient size and/or use of iterative reconstruction technique. CONTRAST:  <See Chart> OMNIPAQUE IOHEXOL 350 MG/ML SOLN COMPARISON:  Same-day CTA abdomen/pelvis. FINDINGS: VASCULAR Aorta: Moderate atherosclerotic plaque is again seen in the nonaneurysmal abdominal aorta. There is no evidence of dissection, vasculitis, or significant stenosis. Celiac: Patent without evidence of aneurysm, dissection, vasculitis or significant stenosis. SMA: Patent without evidence of aneurysm, dissection, vasculitis or significant stenosis. Renals: There is atherosclerotic plaque of the origin of the renal arteries bilaterally resulting in moderate to severe stenosis bilaterally, right worse than left. The renal arteries are patent distally. There is no evidence of aneurysm, dissection, vasculitis, or fibromuscular dysplasia. IMA: Patent without evidence of aneurysm, dissection, vasculitis or significant stenosis. Inflow: Choose Proximal Outflow: Bilateral common femoral and visualized portions of the superficial and profunda femoral arteries are patent without evidence of aneurysm, dissection, vasculitis or significant stenosis. Veins: The main portal and splenic veins are  patent. There is no evidence of active extravasation. Review of the MIP images confirms the above findings. NON-VASCULAR Lower chest: The lung bases are clear. Coronary artery calcification is again seen.  Hepatobiliary: The liver and gallbladder are unremarkable. Pancreas: Unremarkable. Spleen: Unremarkable. Adrenals/Urinary Tract: Adrenals are unremarkable. A left calyceal diverticulum is noted with layering contrast dependently. There is no hydronephrosis or hydroureter. There are no suspicious renal lesions. There are no stones. Excreted contrast is seen in the otherwise normal urinary bladder. Stomach/Bowel: The stomach is unremarkable. There is no evidence of bowel obstruction. 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 No active extravasation is seen along the GI tract. Embolization coils are seen in the left upper quadrant presumably related to prior diverticular bleed treatment. Appendix is normal. Lymphatic: There is no abdominal or pelvic lymphadenopathy. Reproductive: The uterus is unremarkable.  There is no adnexal mass. Other: There is no ascites or free air. Musculoskeletal: Severe compression deformity of the L1 vertebral body with vertebra plana is unchanged. There is no acute osseous abnormality or suspicious osseous lesion. IMPRESSION: No evidence of active GI bleed or significant change compared to the study from earlier the same day. Electronically Signed   By: Valetta Mole M.D.   On: 05/03/2022 12:13   CT ANGIO GI BLEED  Result Date: 05/03/2022 CLINICAL DATA:  Lower GI bleed. EXAM: CTA ABDOMEN AND PELVIS WITHOUT AND WITH CONTRAST TECHNIQUE: Multidetector CT imaging of the abdomen and pelvis was performed using the standard protocol during bolus administration of intravenous contrast. Multiplanar reconstructed images and MIPs were obtained and reviewed to evaluate the vascular anatomy. RADIATION DOSE REDUCTION: This exam was performed according to the  departmental dose-optimization program which includes automated exposure control, adjustment of the mA and/or kV according to patient size and/or use of iterative reconstruction technique. CONTRAST:  44m OMNIPAQUE IOHEXOL 350 MG/ML SOLN COMPARISON:  GI bleed study dated 12/19/2020. FINDINGS: Evaluation is limited due to streak artifact caused by patient's arms and overlying support wires. VASCULAR Aorta: Moderate atherosclerotic calcification of the aorta. No aneurysmal dilatation or dissection. No periaortic fluid collection. Celiac: Patent without evidence of aneurysm, dissection, vasculitis or significant stenosis. SMA: Patent without evidence of aneurysm, dissection, vasculitis or significant stenosis. Renals: Atherosclerotic calcification of the origins of the renal arteries. The renal arteries remain patent. IMA: Patent without evidence of aneurysm, dissection, vasculitis or significant stenosis. Inflow: Mild atherosclerotic calcification of the iliac arteries. The iliac arteries are patent. No aneurysmal dilatation or dissection. Proximal Outflow: The visualized proximal femoral arteries are patent. Veins: The IVC is unremarkable. The SMV, splenic vein, and main portal vein are patent. No portal venous gas. Review of the MIP images confirms the above findings. NON-VASCULAR Lower chest: The visualized lung bases are clear. There is coronary vascular calcification. No intra-abdominal free air or free fluid. Hepatobiliary: The liver is unremarkable. Mild biliary ductal dilatation or mild periportal edema. There is small amount of layering sludge or small stones within the gallbladder. No pericholecystic fluid or evidence of acute cholecystitis by CT. Pancreas: Unremarkable. No pancreatic ductal dilatation or surrounding inflammatory changes. Spleen: Normal in size without focal abnormality. Adrenals/Urinary Tract: The adrenal glands unremarkable. There is no hydronephrosis or nephrolithiasis on either side.  There is a 2.7 cm left renal upper pole parapelvic cyst. The visualized ureters and urinary bladder appear unremarkable. Stomach/Bowel: There is severe diverticulosis of the distal colon without active inflammatory changes. There is no bowel obstruction or active inflammation. No evidence of active GI bleed. The appendix is normal. Lymphatic: No adenopathy. Reproductive: The uterus is grossly unremarkable. No adnexal masses. Other: None Musculoskeletal: Osteopenia with degenerative changes of the spine. No acute osseous pathology. IMPRESSION: 1.  No acute intra-abdominal or pelvic pathology. No evidence of active GI bleed. 2. Severe diverticulosis of the distal colon without active inflammatory changes. No bowel obstruction. Normal appendix. Electronically Signed   By: Anner Crete M.D.   On: 05/03/2022 01:12       Hosie Poisson M.D. [facility] 05/04/2022, 3:38 PM  Available via Epic secure chat 7am-7pm After 7 pm, please refer to night coverage provider listed on amion.    Addendum: Received page around 6 :30 pm, regarding NM blood scan, " Positive for active GI bleeding involving the lower GI tract. Exact site difficult to pinpoint but suspected source is splenic flexure of the colon." Notified GI of the results, plan was to prep her for colonoscopy in am.    Paged and discussed with IR on call Dr Maryelizabeth Kaufmann,  bases on previous diverticular bleed and history , suggested intermittent bleeding, recommended to observe overnight and if she bleeds or becomes unstable , to go ahead with CTA and call IR for embolization.   Also called general surgery on Call Dr Marcello Moores, who will see the patient tomorrow and see if she is a surgical candidate. Updated on call floor coverage NP .  Transferred the patient to ICU for closer monitoring for active bleeding and hemodynamics.  Called the patient's son Dr Woody Seller and updated him.     Hosie Poisson , MD

## 2022-05-04 NOTE — Interval H&P Note (Signed)
History and Physical Interval Note: 81 year old female with ongoing hematochezia, likely related to diverticular bleeding, CTA x2 did not show active bleeding, colonoscopy with propofol planned today.  05/04/2022 11:30 AM  [name]  has presented today for colonoscopy with the diagnosis of Hematochezia.  The various methods of treatment have been discussed with the patient and family. After consideration of risks, benefits and other options for treatment, the patient has consented to  Procedure(s): COLONOSCOPY WITH PROPOFOL (N/A) as a surgical intervention.  The patient's history has been reviewed, patient examined, no change in status, stable for surgery.  I have reviewed the patient's chart and labs.  Questions were answered to the patient's satisfaction.     Ronnette Juniper

## 2022-05-04 NOTE — Anesthesia Postprocedure Evaluation (Signed)
Anesthesia Post Note  Patient: [name]  Procedure(s) Performed: COLONOSCOPY WITH PROPOFOL POLYPECTOMY     Patient location during evaluation: PACU Anesthesia Type: MAC Level of consciousness: awake and alert Pain management: pain level controlled Vital Signs Assessment: post-procedure vital signs reviewed and stable Respiratory status: spontaneous breathing, nonlabored ventilation, respiratory function stable and patient connected to nasal cannula oxygen Cardiovascular status: stable and blood pressure returned to baseline Postop Assessment: no apparent nausea or vomiting Anesthetic complications: no   No notable events documented.  Last Vitals:  Vitals:   05/04/22 1248 05/04/22 1300  BP:  (!) 106/48  Pulse: 88 83  Resp: (!) 21 17  Temp:    SpO2: 100% 99%    Last Pain:  Vitals:   05/04/22 1300  TempSrc:   PainSc: 0-No pain                 [name]

## 2022-05-04 NOTE — Progress Notes (Signed)
PT Cancellation Note  Patient Details Name: [name] MRN: [id number] DOB: 05-08-1941   Cancelled Treatment:     PT order received but eval deferred this date - pt for colonoscopy.  Will follow.   Darrly Loberg 05/04/2022, 3:16 PM

## 2022-05-04 NOTE — Progress Notes (Signed)
       CROSS COVER NOTE  NAME: [name] MRN: [id number] DOB : [birth year]    Date of Service   05/04/2022   HPI/Events of Note   Follow-up on patient:  Patient was transferred to ICU for closer monitoring.    Per night nurse, patient has had multiple bloody bowel movements during daytime.  During my bedside assessment the patient is alert and oriented.  She is able to express her needs.  She currently has no discomfort, chest pain, shortness of breath, dizziness, nausea, vomiting.  She does endorse having multiple bowel movements in bed.  Bowel sounds active.  Patient denies tenderness to abdomen.  Breath sounds clear.  Bedside RN was notified of level of care change.  Patient is currently hemodynamically stable and will be receiving a unit of blood that was previously ordered.  Plan was discussed with Dr. Karleen Hampshire to do a CT angiogram GI bleed.  Per radiology staff, because patient had 2 other CTA GI bleed studies done the earliest that the next one can be done is on 05/04/22 at 1200. Patient will be seen by GI in the AM for colonoscopy.  HGB --> 7.4 --> 8.1, tolerating GoLytely well.   0230- BP 95/36 (53), patient asymptomatic.  Will be given 500 cc NS bolus.     Interventions/ Plan   H and H every 6 hours Transfuse if hemoglobin<7       Raenette Rover, DNP, [place]

## 2022-05-05 ENCOUNTER — Encounter (HOSPITAL_COMMUNITY): Payer: Self-pay | Admitting: Gastroenterology

## 2022-05-05 DIAGNOSIS — K922 Gastrointestinal hemorrhage, unspecified: Secondary | ICD-10-CM | POA: Diagnosis not present

## 2022-05-05 DIAGNOSIS — G2581 Restless legs syndrome: Secondary | ICD-10-CM | POA: Diagnosis not present

## 2022-05-05 DIAGNOSIS — J449 Chronic obstructive pulmonary disease, unspecified: Secondary | ICD-10-CM | POA: Diagnosis not present

## 2022-05-05 DIAGNOSIS — I9589 Other hypotension: Secondary | ICD-10-CM | POA: Diagnosis not present

## 2022-05-05 LAB — CBC WITH DIFFERENTIAL/PLATELET
Abs Immature Granulocytes: 0.05 10*3/uL (ref 0.00–0.07)
Basophils Absolute: 0 10*3/uL (ref 0.0–0.1)
Basophils Relative: 1 %
Eosinophils Absolute: 0.3 10*3/uL (ref 0.0–0.5)
Eosinophils Relative: 4 %
HCT: 24.8 % — ABNORMAL LOW (ref 36.0–46.0)
Hemoglobin: 7.9 g/dL — ABNORMAL LOW (ref 12.0–15.0)
Immature Granulocytes: 1 %
Lymphocytes Relative: 17 %
Lymphs Abs: 1.1 10*3/uL (ref 0.7–4.0)
MCH: 30.2 pg (ref 26.0–34.0)
MCHC: 31.9 g/dL (ref 30.0–36.0)
MCV: 94.7 fL (ref 80.0–100.0)
Monocytes Absolute: 0.9 10*3/uL (ref 0.1–1.0)
Monocytes Relative: 13 %
Neutro Abs: 4.5 10*3/uL (ref 1.7–7.7)
Neutrophils Relative %: 64 %
Platelets: 107 10*3/uL — ABNORMAL LOW (ref 150–400)
RBC: 2.62 MIL/uL — ABNORMAL LOW (ref 3.87–5.11)
RDW: 18.1 % — ABNORMAL HIGH (ref 11.5–15.5)
WBC: 6.9 10*3/uL (ref 4.0–10.5)
nRBC: 0.3 % — ABNORMAL HIGH (ref 0.0–0.2)

## 2022-05-05 LAB — BASIC METABOLIC PANEL
Anion gap: 2 — ABNORMAL LOW (ref 5–15)
BUN: 9 mg/dL (ref 8–23)
CO2: 24 mmol/L (ref 22–32)
Calcium: 7.5 mg/dL — ABNORMAL LOW (ref 8.9–10.3)
Chloride: 111 mmol/L (ref 98–111)
Creatinine, Ser: 0.46 mg/dL (ref 0.44–1.00)
GFR, Estimated: >60 mL/min (ref >60)
Glucose, Bld: 119 mg/dL — ABNORMAL HIGH (ref 70–99)
Potassium: 3.4 mmol/L — ABNORMAL LOW (ref 3.5–5.1)
Sodium: 137 mmol/L (ref 135–145)

## 2022-05-05 LAB — HEMOGLOBIN AND HEMATOCRIT, BLOOD
HCT: 25 % — ABNORMAL LOW (ref 36.0–46.0)
HCT: 25.1 % — ABNORMAL LOW (ref 36.0–46.0)
HCT: 25.8 % — ABNORMAL LOW (ref 36.0–46.0)
Hemoglobin: 8.2 g/dL — ABNORMAL LOW (ref 12.0–15.0)
Hemoglobin: 8.2 g/dL — ABNORMAL LOW (ref 12.0–15.0)
Hemoglobin: 8.3 g/dL — ABNORMAL LOW (ref 12.0–15.0)

## 2022-05-05 LAB — MAGNESIUM: Magnesium: 1.9 mg/dL (ref 1.7–2.4)

## 2022-05-05 MED ORDER — POTASSIUM CHLORIDE CRYS ER 20 MEQ PO TBCR
40.0000 meq | EXTENDED_RELEASE_TABLET | Freq: Once | ORAL | Status: AC
  Administered 2022-05-05: 40 meq via ORAL
  Filled 2022-05-05: qty 2

## 2022-05-05 NOTE — Progress Notes (Signed)
[facility]                                                                               Naphtali Riede, is a 81 y.o. female, DOB - 05/29/1941, VZD:[id number] Admit date - 05/02/2022    Outpatient Primary MD for the patient is Cleophas Dunker, [name], NP  LOS - 2  days    Brief summary   AISHI COURTS is a 81 y.o. 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   Patient reports having brisk rectal bleeding for the past 3 to 4 days when having a bowel movement.  Has felt increased abdominal distention but no pain.  No nausea or vomiting. She has history of diverticular bleed in 11/2020 s/p embolization with recurrence on 05/2021 that did not require intervention.  She was then admitted in 12/2021 requiring transfusion.  She underwent EGD with no active signs of bleeding.   She followed up with her PCP  on 05/02/2022 and was noted to have downward trending hemoglobin of 11.2 yesterday down to 10.2 today with continuous rectal bleeding and was advised to present to the ED. She continued to have rectal bleeding and her hemoglobin dropped to 7.4, 2 units of prbc transfusion given in the last 24 hours.  Hemoglobin at 7.3 today. She also underwent colonoscopy this morning.     Assessment & Plan    Assessment and Plan:*    Acute GI bleeding -history of diverticular bleed in 11/2020 s/p embolization with recurrence on 05/2021 that did not require intervention.  She was then admitted in 12/2021 with recurrent GI bleed requiring transfusion.   She underwent EGD with no active signs of bleeding. -Hgb has downward trend from 11.2 (11/1) --> 10.2 --> 8.9 on presentation -->7.7. to 7.1 to 8  to 7.4 to 7.6 to 7.3 to 8.3 to 7.9 to 8.2  -Guiac positive stools.  - s/p a total of 3 units of Prbc transfusion. 1 more unit of prbc ordered. Anemia panel unremarkable.  -  stat CT angio of the abdomen twice did not show nay evidence of acute GI bleed. Severe diverticulosis of the distal colon without active inflammatory changes. No bowel obstruction. - NM bleeding scan showed active bleed at the site of splenic flexure of the colon . Discussed with IR on call Dr Maryelizabeth Kaufmann,  bases on previous diverticular bleed and history , suggested intermittent bleeding, recommended to observe overnight and if she bleeds or becomes unstable , to go ahead with CTA and call IR for embolization.  - she underwent colonoscopy showing blood in the entire colon. Prep was poor. Diffuse diverticulosis. 2 polyps from the transverse colon removed. - General surgery consulted to see if she is a candidate for colectomy.  - bleeding appears to have stopped for now.  - hemoglobin stable between 7.5 to 8.  - she was started on a diet and able to tolerate without any issues.      Hypotension BP parameters have improved. Marland Kitchen   Restless leg syndrome Continue home meds  COPD GOLD II  Stable.  Continue home  bronchodilator. No wheezing heard on exam.  - Continue nocturnal O2 2 L  Hypokalemia:  Replaced. Recheck in am.      Estimated body mass index is 32.37 kg/m as calculated from the following:   Height as of this encounter: '5\' 1"'$  (1.549 m).   Weight as of this encounter: 77.7 kg.  Code Status: full code.  DVT Prophylaxis:  SCDs Start: 05/02/22 2316   Level of Care: Level of care: Telemetry Family Communication: none at bedside.   Disposition Plan:     Remains inpatient appropriate: GI work up for blood oss anemia.   Procedures:  None.  Consultants:   GASTROENTEROLOGY  Antimicrobials:   Anti-infectives (From admission, onward)    None        Medications  Scheduled Meds:  sodium chloride   Intravenous Once   sodium chloride   Intravenous Once   sodium chloride   Intravenous Once   acetaminophen  1,000 mg Oral BID   acidophilus  1 capsule Oral QHS   vitamin C   1,000 mg Oral q morning   Chlorhexidine Gluconate Cloth  6 each Topical Daily   cholecalciferol  2,000 Units Oral q morning   dorzolamide-timolol  1 drop Both Eyes BID   ferrous gluconate  324 mg Oral q morning   latanoprost  1 drop Both Eyes QHS   loratadine  10 mg Oral Daily   melatonin  3 mg Oral Once   mirabegron ER  25 mg Oral Daily   Netarsudil Dimesylate  1 drop Both Eyes QHS   pantoprazole (PROTONIX) IV  40 mg Intravenous Q12H   sertraline  100 mg Oral Daily   simvastatin  20 mg Oral Daily   umeclidinium-vilanterol  1 puff Inhalation Daily   Continuous Infusions:  lactated ringers 100 mL/hr at 05/05/22 0625   PRN Meds:.albuterol, mouth rinse, rOPINIRole    Subjective:   Rachal Dvorsky was seen and examined today. No new complaints today.  Objective:   Vitals:   05/05/22 0900 05/05/22 1000 05/05/22 1130 05/05/22 1541  BP:  (!) 112/35 (!) 107/42 (!) 108/52  Pulse: 73 (!) 111 85 90  Resp: 20 (!) '24 20 15  '$ Temp:   98.6 F (37 C) 98.5 F (36.9 C)  TempSrc:   Axillary Oral  SpO2: 95% 96% 97% 97%  Weight:      Height:        Intake/Output Summary (Last 24 hours) at 05/05/2022 1748 Last data filed at 05/05/2022 1300 Gross per 24 hour  Intake 2751.77 ml  Output 2130 ml  Net 621.77 ml    Filed Weights   05/03/22 0344 05/03/22 2230  Weight: 78.3 kg 77.7 kg     Exam General exam: Appears calm and comfortable  Respiratory system: Clear to auscultation. Respiratory effort normal. Cardiovascular system: S1 & S2 heard, RRR. No JVD,  No pedal edema. Gastrointestinal system: Abdomen is nondistended, soft and nontender.  Normal bowel sounds heard. Central nervous system: Alert and oriented. No focal neurological deficits. Hard of hearing.  Extremities: Symmetric 5 x 5 power. Skin: No rashes,  Psychiatry: . Mood & affect appropriate.     Data Reviewed:  I have personally reviewed following labs and imaging studies   CBC Lab Results  Component Value Date   WBC  6.9 05/05/2022   RBC 2.62 (L) 05/05/2022   HGB 8.2 (L) 05/05/2022   HCT 25.1 (L) 05/05/2022   MCV 94.7 05/05/2022   MCH 30.2 05/05/2022   PLT 107 (  L) 05/05/2022   MCHC 31.9 05/05/2022   RDW 18.1 (H) 05/05/2022   LYMPHSABS 1.1 05/05/2022   MONOABS 0.9 05/05/2022   EOSABS 0.3 05/05/2022   BASOSABS 0.0 58/03/9832     Last metabolic panel Lab Results  Component Value Date   NA 137 05/05/2022   K 3.4 (L) 05/05/2022   CL 111 05/05/2022   CO2 24 05/05/2022   BUN 9 05/05/2022   CREATININE 0.46 05/05/2022   GLUCOSE 119 (H) 05/05/2022   GFRNONAA >60 05/05/2022   GFRAA >60 09/14/2017   CALCIUM 7.5 (L) 05/05/2022   PHOS 3.2 05/05/2021   PROT 6.1 (L) 05/02/2022   ALBUMIN 3.5 05/02/2022   BILITOT 0.4 05/02/2022   ALKPHOS 46 05/02/2022   AST 18 05/02/2022   ALT 14 05/02/2022   ANIONGAP 2 (L) 05/05/2022    CBG (last 3)  No results for input(s): "GLUCAP" in the last 72 hours.    Coagulation Profile: No results for input(s): "INR", "PROTIME" in the last 168 hours.   Radiology Studies: No results found.     Hosie Poisson M.D. [facility] 05/05/2022, 5:48 PM  Available via Epic secure chat 7am-7pm After 7 pm, please refer to night coverage provider listed on amion.    Addendum: Received page around 6 :30 pm, regarding NM blood scan, " Positive for active GI bleeding involving the lower GI tract. Exact site difficult to pinpoint but suspected source is splenic flexure of the colon." Notified GI of the results, plan was to prep her for colonoscopy in am.    Paged and discussed with IR on call Dr Maryelizabeth Kaufmann,  bases on previous diverticular bleed and history , suggested intermittent bleeding, recommended to observe overnight and if she bleeds or becomes unstable , to go ahead with CTA and call IR for embolization.   Also called general surgery on Call Dr Marcello Moores, who will see the patient tomorrow and see if she is a surgical candidate. Updated on call floor coverage NP .   Transferred the patient to ICU for closer monitoring for active bleeding and hemodynamics.  Called the patient's son Dr Woody Seller and updated him.     Hosie Poisson , MD

## 2022-05-05 NOTE — Evaluation (Signed)
Physical Therapy Evaluation Patient Details Name: [name] MRN: [id number] DOB: [birth year] Today's Date: 05/05/2022  History of Present Illness  Pt admitted from [facility]* GIB and hypotension.  Pt with hx of BPPV,  Bil TKR, R ankle fx, and COPD (on 2L O2 at night).  Clinical Impression  Pt admitted as above and presents with functional mobility limitations 2* generalized weakness, decreased endurance and ambulatory balance deficits.  Pt hopes to progress to dc home to ALF apartment with spouse and would benefit from follow up HHPT to further address deficits.     Recommendations for follow up therapy are one component of a multi-disciplinary discharge planning process, led by the attending physician.  Recommendations may be updated based on patient status, additional functional criteria and insurance authorization.  Follow Up Recommendations Home health PT      Assistance Recommended at Discharge Intermittent Supervision/Assistance  Patient can return home with the following  A little help with walking and/or transfers;A little help with bathing/dressing/bathroom;Assistance with cooking/housework;Help with stairs or ramp for entrance;Assist for transportation    Equipment Recommendations None recommended by PT  Recommendations for Other Services       Functional Status Assessment Patient has had a recent decline in their functional status and demonstrates the ability to make significant improvements in function in a reasonable and predictable amount of time.     Precautions / Restrictions Precautions Precautions: Fall Restrictions Weight Bearing Restrictions: No      Mobility  Bed Mobility Overal bed mobility: Needs Assistance Bed Mobility: Supine to Sit     Supine to sit: Min assist     General bed mobility comments: min assist to complete rotation to EOB sitting using bed pad    Transfers Overall transfer level: Needs assistance Equipment used:  Rolling walker (2 wheels) Transfers: Sit to/from Stand Sit to Stand: Min assist           General transfer comment: cues for use of UEs and steady assist    Ambulation/Gait Ambulation/Gait assistance: Min assist Gait Distance (Feet): 68 Feet Assistive device: Rolling walker (2 wheels) Gait Pattern/deviations: Step-to pattern, Step-through pattern, Decreased step length - right, Decreased step length - left, Shuffle, Trunk flexed Gait velocity: decr     General Gait Details: increased time with cues for posture and position from RW;  Physical assist to steady and to manage RW  Stairs            Wheelchair Mobility    Modified Rankin (Stroke Patients Only)       Balance Overall balance assessment: Needs assistance Sitting-balance support: No upper extremity supported, Feet supported Sitting balance-Leahy Scale: Good     Standing balance support: Bilateral upper extremity supported Standing balance-Leahy Scale: Poor                               Pertinent Vitals/Pain      Home Living Family/patient expects to be discharged to:: Assisted living                 Home Equipment: Transport chair;Cane - single Barista (2 wheels);Rollator (4 wheels);Shower seat;Grab bars - toilet;Grab bars - tub/shower;Hand held shower head      Prior Function Prior Level of Function : Needs assist             Mobility Comments: Pt ambulates with RW; uses transport chair for longer distances ADLs Comments: reports has a "  visiting angel" 4x/week to assist with bathing, some dressing; spouse assists on other days as needed.  spouse assist with IADLs.     Hand Dominance   Dominant Hand: Right    Extremity/Trunk Assessment   Upper Extremity Assessment Upper Extremity Assessment: Generalized weakness    Lower Extremity Assessment Lower Extremity Assessment: Generalized weakness    Cervical / Trunk Assessment Cervical / Trunk Assessment:  Kyphotic  Communication   Communication: HOH  Cognition Arousal/Alertness: Awake/alert Behavior During Therapy: WFL for tasks assessed/performed Overall Cognitive Status: Within Functional Limits for tasks assessed                                          General Comments      Exercises     Assessment/Plan    PT Assessment Patient needs continued PT services  PT Problem List Decreased strength;Decreased activity tolerance;Decreased balance;Decreased mobility;Decreased knowledge of use of DME       PT Treatment Interventions DME instruction;Gait training;Functional mobility training;Therapeutic activities;Therapeutic exercise;Balance training;Patient/family education    PT Goals (Current goals can be found in the Care Plan section)  Acute Rehab PT Goals Patient Stated Goal: Regain IND PT Goal Formulation: With patient Time For Goal Achievement: 05/19/22 Potential to Achieve Goals: Good    Frequency Min 3X/week     Co-evaluation               AM-PAC PT "6 Clicks" Mobility  Outcome Measure Help needed turning from your back to your side while in a flat bed without using bedrails?: A Little Help needed moving from lying on your back to sitting on the side of a flat bed without using bedrails?: A Little Help needed moving to and from a bed to a chair (including a wheelchair)?: A Little Help needed standing up from a chair using your arms (e.g., wheelchair or bedside chair)?: A Little Help needed to walk in hospital room?: A Little Help needed climbing 3-5 steps with a railing? : A Lot 6 Click Score: 17    End of Session Equipment Utilized During Treatment: Gait belt Activity Tolerance: Patient tolerated treatment well Patient left: in chair;with call bell/phone within reach;with chair alarm set Nurse Communication: Mobility status PT Visit Diagnosis: Difficulty in walking, not elsewhere classified (R26.2);Muscle weakness (generalized) (M62.81)     Time: 1135-1200 PT Time Calculation (min) (ACUTE ONLY): 25 min   Charges:   PT Evaluation $PT Eval Low Complexity: 1 Low PT Treatments $Gait Training: 8-22 mins        Debe Coder PT Acute Rehabilitation Services Pager [phone] Office [phone]   [name] 05/05/2022, 12:57 PM

## 2022-05-05 NOTE — Progress Notes (Signed)
Report given to [place]. Belongings collected & pt notified of transfer. Pt stable at this time.

## 2022-05-05 NOTE — Progress Notes (Signed)
Subjective: Patient reports she has been passing flatus since colonoscopy but has not had any further rectal bleeding or bowel movement since colonoscopy. Last episode of rectal bleeding was on 05/04/2022.  Objective: Vital signs in last 24 hours: Temp:  [97.5 F (36.4 C)-99.3 F (37.4 C)] 98.2 F (36.8 C) (11/05 0824) Pulse Rate:  [73-111] 111 (11/05 1000) Resp:  [14-24] 24 (11/05 1000) BP: (95-137)/(30-82) 112/35 (11/05 1000) SpO2:  [90 %-100 %] 96 % (11/05 1000) Weight change:  Last BM Date : 05/04/22  PE: Sitting up on bed, appears comfortable GENERAL: Elderly, not in distress, mild pallor  ABDOMEN:Soft, nondistended, nontender, normoactive bowel sounds EXTREMITIES:No deformity, no edema  Lab Results: Results for orders placed or performed during the hospital encounter of 05/02/22 (from the past 48 hour(s))  CBC with Differential/Platelet     Status: Abnormal   Collection Time: 05/03/22  6:30 PM  Result Value Ref Range   WBC 7.1 4.0 - 10.5 K/uL   RBC 2.59 (L) 3.87 - 5.11 MIL/uL   Hemoglobin 7.7 (L) 12.0 - 15.0 g/dL   HCT 23.8 (L) 36.0 - 46.0 %   MCV 91.9 80.0 - 100.0 fL   MCH 29.7 26.0 - 34.0 pg   MCHC 32.4 30.0 - 36.0 g/dL   RDW 17.7 (H) 11.5 - 15.5 %   Platelets 106 (L) 150 - 400 K/uL   nRBC 0.0 0.0 - 0.2 %   Neutrophils Relative % 73 %   Neutro Abs 5.1 1.7 - 7.7 K/uL   Lymphocytes Relative 14 %   Lymphs Abs 1.0 0.7 - 4.0 K/uL   Monocytes Relative 11 %   Monocytes Absolute 0.8 0.1 - 1.0 K/uL   Eosinophils Relative 2 %   Eosinophils Absolute 0.1 0.0 - 0.5 K/uL   Basophils Relative 0 %   Basophils Absolute 0.0 0.0 - 0.1 K/uL   Immature Granulocytes 0 %   Abs Immature Granulocytes 0.03 0.00 - 0.07 K/uL    Comment: Performed at [facility], [place] [address] [place], [place] [address]  Prepare RBC (crossmatch)     Status: None   Collection Time: 05/03/22  7:05 PM  Result Value Ref Range   Order Confirmation      ORDER PROCESSED BY BLOOD  BANK Performed at [facility], [place] [address] [place], [place] [address]   Hemoglobin and hematocrit, blood     Status: Abnormal   Collection Time: 05/03/22  9:21 PM  Result Value Ref Range   Hemoglobin 7.4 (L) 12.0 - 15.0 g/dL   HCT 23.3 (L) 36.0 - 46.0 %    Comment: Performed at [facility], [place] [address], [place], [place] [address]  Basic metabolic panel     Status: Abnormal   Collection Time: 05/03/22  9:21 PM  Result Value Ref Range   Sodium 138 135 - 145 mmol/L   Potassium 3.6 3.5 - 5.1 mmol/L   Chloride 111 98 - 111 mmol/L   CO2 22 22 - 32 mmol/L   Glucose, Bld 110 (H) 70 - 99 mg/dL    Comment: Glucose reference range applies only to samples taken after fasting for at least 8 hours.   BUN 20 8 - 23 mg/dL   Creatinine, Ser 0.32 (L) 0.44 - 1.00 mg/dL   Calcium 7.6 (L) 8.9 - 10.3 mg/dL   GFR, Estimated >60 >60 mL/min    Comment: (NOTE) Calculated using the CKD-EPI Creatinine Equation (2021)    Anion gap 5 5 -  15    Comment: Performed at [facility], [place] [address], [place], [place] [address]  MRSA Next Gen by PCR, Nasal     Status: None   Collection Time: 05/03/22 10:39 PM   Specimen: Nasal Mucosa; Nasal Swab  Result Value Ref Range   MRSA by PCR Next Gen NOT DETECTED NOT DETECTED    Comment: (NOTE) The GeneXpert MRSA Assay (FDA approved for NASAL specimens only), is one component of a comprehensive MRSA colonization surveillance program. It is not intended to diagnose MRSA infection nor to guide or monitor treatment for MRSA infections. Test performance is not FDA approved in patients less than 35 years old. Performed at [facility], [place] [address] [place], [place] [address]   Hemoglobin and hematocrit, blood     Status: Abnormal   Collection Time: 05/04/22  1:45 AM  Result Value Ref Range   Hemoglobin 8.1 (L) 12.0 - 15.0 g/dL   HCT 24.3 (L) 36.0 - 46.0 %    Comment: Performed at  [facility], [place] [address] [place], [place] [address]  CBC with Differential/Platelet     Status: Abnormal   Collection Time: 05/04/22  7:52 AM  Result Value Ref Range   WBC 7.0 4.0 - 10.5 K/uL   RBC 2.41 (L) 3.87 - 5.11 MIL/uL   Hemoglobin 7.6 (L) 12.0 - 15.0 g/dL   HCT 23.1 (L) 36.0 - 46.0 %   MCV 95.9 80.0 - 100.0 fL   MCH 31.5 26.0 - 34.0 pg   MCHC 32.9 30.0 - 36.0 g/dL   RDW 17.0 (H) 11.5 - 15.5 %   Platelets 101 (L) 150 - 400 K/uL   nRBC 0.0 0.0 - 0.2 %   Neutrophils Relative % 65 %   Neutro Abs 4.5 1.7 - 7.7 K/uL   Lymphocytes Relative 19 %   Lymphs Abs 1.3 0.7 - 4.0 K/uL   Monocytes Relative 13 %   Monocytes Absolute 0.9 0.1 - 1.0 K/uL   Eosinophils Relative 2 %   Eosinophils Absolute 0.2 0.0 - 0.5 K/uL   Basophils Relative 0 %   Basophils Absolute 0.0 0.0 - 0.1 K/uL   Immature Granulocytes 1 %   Abs Immature Granulocytes 0.04 0.00 - 0.07 K/uL    Comment: Performed at [facility], [place] [address], [place], [place] [address]  Basic metabolic panel     Status: Abnormal   Collection Time: 05/04/22  7:52 AM  Result Value Ref Range   Sodium 140 135 - 145 mmol/L   Potassium 3.2 (L) 3.5 - 5.1 mmol/L   Chloride 113 (H) 98 - 111 mmol/L   CO2 22 22 - 32 mmol/L   Glucose, Bld 108 (H) 70 - 99 mg/dL    Comment: Glucose reference range applies only to samples taken after fasting for at least 8 hours.   BUN 20 8 - 23 mg/dL   Creatinine, Ser 0.34 (L) 0.44 - 1.00 mg/dL   Calcium 7.3 (L) 8.9 - 10.3 mg/dL   GFR, Estimated >60 >60 mL/min    Comment: (NOTE) Calculated using the CKD-EPI Creatinine Equation (2021)    Anion gap 5 5 - 15    Comment: Performed at [facility], [place] [address] [place], [place] [address]  CBC with Differential/Platelet     Status: Abnormal   Collection Time: 05/04/22  3:32 PM  Result Value Ref Range   WBC 6.7 4.0 - 10.5 K/uL   RBC 2.32 (  L) 3.87 - 5.11 MIL/uL   Hemoglobin 7.3 (L) 12.0 - 15.0  g/dL   HCT 22.0 (L) 36.0 - 46.0 %   MCV 94.8 80.0 - 100.0 fL   MCH 31.5 26.0 - 34.0 pg   MCHC 33.2 30.0 - 36.0 g/dL   RDW 16.8 (H) 11.5 - 15.5 %   Platelets 114 (L) 150 - 400 K/uL    Comment: Immature Platelet Fraction may be clinically indicated, consider ordering this additional test [id number]    nRBC 0.0 0.0 - 0.2 %   Neutrophils Relative % 58 %   Neutro Abs 3.9 1.7 - 7.7 K/uL   Lymphocytes Relative 24 %   Lymphs Abs 1.6 0.7 - 4.0 K/uL   Monocytes Relative 13 %   Monocytes Absolute 0.9 0.1 - 1.0 K/uL   Eosinophils Relative 3 %   Eosinophils Absolute 0.2 0.0 - 0.5 K/uL   Basophils Relative 1 %   Basophils Absolute 0.0 0.0 - 0.1 K/uL   Immature Granulocytes 1 %   Abs Immature Granulocytes 0.05 0.00 - 0.07 K/uL    Comment: Performed at [facility], [place] [address] [place], [place] [address]  Prepare RBC (crossmatch)     Status: None   Collection Time: 05/04/22  4:58 PM  Result Value Ref Range   Order Confirmation      ORDER PROCESSED BY BLOOD BANK Performed at [facility], [place] [address], [place], [place] [address]   Hemoglobin and hematocrit, blood     Status: Abnormal   Collection Time: 05/05/22 12:08 AM  Result Value Ref Range   Hemoglobin 8.2 (L) 12.0 - 15.0 g/dL   HCT 25.0 (L) 36.0 - 46.0 %    Comment: Performed at [facility], [place] [address] [place], [place] [address]  Magnesium     Status: None   Collection Time: 05/05/22 12:08 AM  Result Value Ref Range   Magnesium 1.9 1.7 - 2.4 mg/dL    Comment: Performed at [facility], [place] [address], [place], [place] [address]  Basic metabolic panel     Status: Abnormal   Collection Time: 05/05/22  6:31 AM  Result Value Ref Range   Sodium 137 135 - 145 mmol/L    Comment: ELECTROLYTES REPEATED TO VERIFY   Potassium 3.4 (L) 3.5 - 5.1 mmol/L    Comment: ELECTROLYTES REPEATED TO VERIFY   Chloride 111 98 - 111 mmol/L    Comment: ELECTROLYTES  REPEATED TO VERIFY   CO2 24 22 - 32 mmol/L    Comment: ELECTROLYTES REPEATED TO VERIFY   Glucose, Bld 119 (H) 70 - 99 mg/dL    Comment: Glucose reference range applies only to samples taken after fasting for at least 8 hours.   BUN 9 8 - 23 mg/dL   Creatinine, Ser 0.46 0.44 - 1.00 mg/dL   Calcium 7.5 (L) 8.9 - 10.3 mg/dL   GFR, Estimated >60 >60 mL/min    Comment: (NOTE) Calculated using the CKD-EPI Creatinine Equation (2021)    Anion gap 2 (L) 5 - 15    Comment: Performed at [facility], [place] [address], [place],  [address]  CBC with Differential/Platelet     Status: Abnormal   Collection Time: 05/05/22  6:31 AM  Result Value Ref Range   WBC 6.9 4.0 - 10.5 K/uL   RBC 2.62 (L) 3.87 - 5.11 MIL/uL   Hemoglobin 7.9 (L) 12.0 - 15.0 g/dL   HCT 24.8 (L) 36.0 -  46.0 %   MCV 94.7 80.0 - 100.0 fL   MCH 30.2 26.0 - 34.0 pg   MCHC 31.9 30.0 - 36.0 g/dL   RDW 18.1 (H) 11.5 - 15.5 %   Platelets 107 (L) 150 - 400 K/uL   nRBC 0.3 (H) 0.0 - 0.2 %   Neutrophils Relative % 64 %   Neutro Abs 4.5 1.7 - 7.7 K/uL   Lymphocytes Relative 17 %   Lymphs Abs 1.1 0.7 - 4.0 K/uL   Monocytes Relative 13 %   Monocytes Absolute 0.9 0.1 - 1.0 K/uL   Eosinophils Relative 4 %   Eosinophils Absolute 0.3 0.0 - 0.5 K/uL   Basophils Relative 1 %   Basophils Absolute 0.0 0.0 - 0.1 K/uL   Immature Granulocytes 1 %   Abs Immature Granulocytes 0.05 0.00 - 0.07 K/uL    Comment: Performed at [facility], [place] [address] [place], [place] [address]    Studies/Results: NM GI Blood Loss  Result Date: 05/03/2022 CLINICAL DATA:  GI bleed EXAM: NUCLEAR MEDICINE GASTROINTESTINAL BLEEDING SCAN TECHNIQUE: Sequential abdominal images were obtained following intravenous administration of Tc-25mlabeled red blood cells. RADIOPHARMACEUTICALS:  20.5 mCi Tc-963mertechnetate in-vitro labeled red cells. COMPARISON:  CT 05/03/2022, 12/19/2020 FINDINGS: Sequential images were obtained over  the course of 1 hour. Later images are degraded by patient motion. At the start of the exam, there is faint activity present within the distal transverse colon, splenic flexure and descending colon. There is progressive activity at the splenic flexure and descending colon. IMPRESSION: Motion degradation. Positive for active GI bleeding involving the lower GI tract. Exact site difficult to pinpoint but suspected source is splenic flexure of the colon. These results will be called to the ordering clinician or representative by the Radiologist Assistant, and communication documented in the PACS or ClFrontier Oil CorporationElectronically Signed   By: KiDonavan Foil.D.   On: 05/03/2022 18:04   CT ANGIO GI BLEED  Result Date: 05/03/2022 CLINICAL DATA:  History of diverticular bleeding requiring embolization presents with rectal bleeding. EXAM: CTA ABDOMEN AND PELVIS WITHOUT AND WITH CONTRAST TECHNIQUE: Multidetector CT imaging of the abdomen and pelvis was performed using the standard protocol during bolus administration of intravenous contrast. Multiplanar reconstructed images and MIPs were obtained and reviewed to evaluate the vascular anatomy. RADIATION DOSE REDUCTION: This exam was performed according to the departmental dose-optimization program which includes automated exposure control, adjustment of the mA and/or kV according to patient size and/or use of iterative reconstruction technique. CONTRAST:  <See Chart> OMNIPAQUE IOHEXOL 350 MG/ML SOLN COMPARISON:  Same-day CTA abdomen/pelvis. FINDINGS: VASCULAR Aorta: Moderate atherosclerotic plaque is again seen in the nonaneurysmal abdominal aorta. There is no evidence of dissection, vasculitis, or significant stenosis. Celiac: Patent without evidence of aneurysm, dissection, vasculitis or significant stenosis. SMA: Patent without evidence of aneurysm, dissection, vasculitis or significant stenosis. Renals: There is atherosclerotic plaque of the origin of the renal  arteries bilaterally resulting in moderate to severe stenosis bilaterally, right worse than left. The renal arteries are patent distally. There is no evidence of aneurysm, dissection, vasculitis, or fibromuscular dysplasia. IMA: Patent without evidence of aneurysm, dissection, vasculitis or significant stenosis. Inflow: Choose Proximal Outflow: Bilateral common femoral and visualized portions of the superficial and profunda femoral arteries are patent without evidence of aneurysm, dissection, vasculitis or significant stenosis. Veins: The main portal and splenic veins are patent. There is no evidence of active extravasation. Review of the MIP images confirms the above findings. NON-VASCULAR Lower  chest: The lung bases are clear. Coronary artery calcification is again seen. Hepatobiliary: The liver and gallbladder are unremarkable. Pancreas: Unremarkable. Spleen: Unremarkable. Adrenals/Urinary Tract: Adrenals are unremarkable. A left calyceal diverticulum is noted with layering contrast dependently. There is no hydronephrosis or hydroureter. There are no suspicious renal lesions. There are no stones. Excreted contrast is seen in the otherwise normal urinary bladder. Stomach/Bowel: The stomach is unremarkable. There is no evidence of bowel obstruction. 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 No active extravasation is seen along the GI tract. Embolization coils are seen in the left upper quadrant presumably related to prior diverticular bleed treatment. Appendix is normal. Lymphatic: There is no abdominal or pelvic lymphadenopathy. Reproductive: The uterus is unremarkable.  There is no adnexal mass. Other: There is no ascites or free air. Musculoskeletal: Severe compression deformity of the L1 vertebral body with vertebra plana is unchanged. There is no acute osseous abnormality or suspicious osseous lesion. IMPRESSION: No evidence of active GI bleed or significant change  compared to the study from earlier the same day. Electronically Signed   By: Valetta Mole M.D.   On: 05/03/2022 12:13    Medications: I have reviewed the patient's current medications.  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  Hemoglobin yesterday 7.3, received 1 unit PRBC transfusion, increased to 8.2 and 7.9.  Hemodynamically stable, being transferred out of ICU to sixth floor.  Plan: Tolerating soft diet, no further evidence of hematochezia, she remains stable hemodynamically.   Hopefully, presumed diverticular bleed has resolved.   Colonic polyps removed, pathology pending, appear benign but possibly adenomatous, repeat colonoscopy not recommended due to age. Continue to monitor, supportive management, H&H monitoring and transfusion if hemoglobin is less than 7.  Ronnette Juniper, MD 05/05/2022, 11:13 AM

## 2022-05-05 NOTE — Plan of Care (Signed)
  Problem: Activity: Goal: Risk for activity intolerance will decrease Outcome: Progressing   Problem: Elimination: Goal: Will not experience complications related to bowel motility Outcome: Progressing Goal: Will not experience complications related to urinary retention Outcome: Progressing   Problem: Safety: Goal: Ability to remain free from injury will improve Outcome: Progressing   

## 2022-05-05 NOTE — Progress Notes (Signed)
1 Day Post-Op   Subjective/Chief Complaint: No complaints, doesn't think any more blood, "would rather bleed to death than have surgery" Csc yesterday  Objective: Vital signs in last 24 hours: Temp:  [97.5 F (36.4 C)-99.3 F (37.4 C)] 98.9 F (37.2 C) (11/05 0400) Pulse Rate:  [74-105] 81 (11/05 0600) Resp:  [14-21] 20 (11/05 0600) BP: (95-137)/(30-82) 122/39 (11/05 0600) SpO2:  [90 %-100 %] 95 % (11/05 0600) Last BM Date : 05/04/22  Intake/Output from previous day: 11/04 0701 - 11/05 0700 In: 2351.2 [I.V.:1225.7; Blood:325; IV Piggyback:800.5] Out: 1200 [Urine:1200] Intake/Output this shift: No intake/output data recorded.  Ab soft nontender nondistended  Lab Results:  Recent Labs    05/04/22 1532 05/05/22 0008 05/05/22 0631  WBC 6.7  --  6.9  HGB 7.3* 8.2* 7.9*  HCT 22.0* 25.0* 24.8*  PLT 114*  --  107*   BMET Recent Labs    05/03/22 2121 05/04/22 0752  NA 138 140  K 3.6 3.2*  CL 111 113*  CO2 22 22  GLUCOSE 110* 108*  BUN 20 20  CREATININE 0.32* 0.34*  CALCIUM 7.6* 7.3*   PT/INR No results for input(s): "LABPROT", "INR" in the last 72 hours. ABG No results for input(s): "PHART", "HCO3" in the last 72 hours.  Invalid input(s): "PCO2", "PO2"  Studies/Results: NM GI Blood Loss  Result Date: 05/03/2022 CLINICAL DATA:  GI bleed EXAM: NUCLEAR MEDICINE GASTROINTESTINAL BLEEDING SCAN TECHNIQUE: Sequential abdominal images were obtained following intravenous administration of Tc-71mlabeled red blood cells. RADIOPHARMACEUTICALS:  20.5 mCi Tc-929mertechnetate in-vitro labeled red cells. COMPARISON:  CT 05/03/2022, 12/19/2020 FINDINGS: Sequential images were obtained over the course of 1 hour. Later images are degraded by patient motion. At the start of the exam, there is faint activity present within the distal transverse colon, splenic flexure and descending colon. There is progressive activity at the splenic flexure and descending colon. IMPRESSION: Motion  degradation. Positive for active GI bleeding involving the lower GI tract. Exact site difficult to pinpoint but suspected source is splenic flexure of the colon. These results will be called to the ordering clinician or representative by the Radiologist Assistant, and communication documented in the PACS or ClFrontier Oil CorporationElectronically Signed   By: KiDonavan Foil.D.   On: 05/03/2022 18:04   CT ANGIO GI BLEED  Result Date: 05/03/2022 CLINICAL DATA:  History of diverticular bleeding requiring embolization presents with rectal bleeding. EXAM: CTA ABDOMEN AND PELVIS WITHOUT AND WITH CONTRAST TECHNIQUE: Multidetector CT imaging of the abdomen and pelvis was performed using the standard protocol during bolus administration of intravenous contrast. Multiplanar reconstructed images and MIPs were obtained and reviewed to evaluate the vascular anatomy. RADIATION DOSE REDUCTION: This exam was performed according to the departmental dose-optimization program which includes automated exposure control, adjustment of the mA and/or kV according to patient size and/or use of iterative reconstruction technique. CONTRAST:  <See Chart> OMNIPAQUE IOHEXOL 350 MG/ML SOLN COMPARISON:  Same-day CTA abdomen/pelvis. FINDINGS: VASCULAR Aorta: Moderate atherosclerotic plaque is again seen in the nonaneurysmal abdominal aorta. There is no evidence of dissection, vasculitis, or significant stenosis. Celiac: Patent without evidence of aneurysm, dissection, vasculitis or significant stenosis. SMA: Patent without evidence of aneurysm, dissection, vasculitis or significant stenosis. Renals: There is atherosclerotic plaque of the origin of the renal arteries bilaterally resulting in moderate to severe stenosis bilaterally, right worse than left. The renal arteries are patent distally. There is no evidence of aneurysm, dissection, vasculitis, or fibromuscular dysplasia. IMA: Patent without evidence of aneurysm, dissection,  vasculitis or  significant stenosis. Inflow: Choose Proximal Outflow: Bilateral common femoral and visualized portions of the superficial and profunda femoral arteries are patent without evidence of aneurysm, dissection, vasculitis or significant stenosis. Veins: The main portal and splenic veins are patent. There is no evidence of active extravasation. Review of the MIP images confirms the above findings. NON-VASCULAR Lower chest: The lung bases are clear. Coronary artery calcification is again seen. Hepatobiliary: The liver and gallbladder are unremarkable. Pancreas: Unremarkable. Spleen: Unremarkable. Adrenals/Urinary Tract: Adrenals are unremarkable. A left calyceal diverticulum is noted with layering contrast dependently. There is no hydronephrosis or hydroureter. There are no suspicious renal lesions. There are no stones. Excreted contrast is seen in the otherwise normal urinary bladder. Stomach/Bowel: The stomach is unremarkable. There is no evidence of bowel obstruction. 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 No active extravasation is seen along the GI tract. Embolization coils are seen in the left upper quadrant presumably related to prior diverticular bleed treatment. Appendix is normal. Lymphatic: There is no abdominal or pelvic lymphadenopathy. Reproductive: The uterus is unremarkable.  There is no adnexal mass. Other: There is no ascites or free air. Musculoskeletal: Severe compression deformity of the L1 vertebral body with vertebra plana is unchanged. There is no acute osseous abnormality or suspicious osseous lesion. IMPRESSION: No evidence of active GI bleed or significant change compared to the study from earlier the same day. Electronically Signed   By: Valetta Mole M.D.   On: 05/03/2022 12:13    Anti-infectives: Anti-infectives (From admission, onward)    None       Assessment/Plan: Likely LGI bleed -ct angio times two negative, bleeding scan with  bleeding noted possibly colon, csc yesterday with old blood and diverticuli throughout colon. Polyps removed.  No active bleeding,  IC valve seen (not sure if any blood from this) -she would be a candidate for colectomy but there is not indication right now.  Not sure what is bleeding (and could conceivably still be small bowel although unlikely) and to commit her to surgery would need to be sure.  If she was bleeding uncontrollably operation would be STC with ileostomy which carries significant morbidity.   -right now does not appear she is bleeding and can monitor -when surgery discussed she does not want to proceed with that   Rolm Bookbinder 05/05/2022

## 2022-05-06 DIAGNOSIS — J449 Chronic obstructive pulmonary disease, unspecified: Secondary | ICD-10-CM | POA: Diagnosis not present

## 2022-05-06 DIAGNOSIS — G2581 Restless legs syndrome: Secondary | ICD-10-CM | POA: Diagnosis not present

## 2022-05-06 DIAGNOSIS — K922 Gastrointestinal hemorrhage, unspecified: Secondary | ICD-10-CM | POA: Diagnosis not present

## 2022-05-06 DIAGNOSIS — I9589 Other hypotension: Secondary | ICD-10-CM | POA: Diagnosis not present

## 2022-05-06 LAB — CBC WITH DIFFERENTIAL/PLATELET
Abs Immature Granulocytes: 0.04 10*3/uL (ref 0.00–0.07)
Basophils Absolute: 0 10*3/uL (ref 0.0–0.1)
Basophils Relative: 0 %
Eosinophils Absolute: 0.2 10*3/uL (ref 0.0–0.5)
Eosinophils Relative: 4 %
HCT: 23.2 % — ABNORMAL LOW (ref 36.0–46.0)
Hemoglobin: 7.4 g/dL — ABNORMAL LOW (ref 12.0–15.0)
Immature Granulocytes: 1 %
Lymphocytes Relative: 27 %
Lymphs Abs: 1.5 10*3/uL (ref 0.7–4.0)
MCH: 30.3 pg (ref 26.0–34.0)
MCHC: 31.9 g/dL (ref 30.0–36.0)
MCV: 95.1 fL (ref 80.0–100.0)
Monocytes Absolute: 0.8 10*3/uL (ref 0.1–1.0)
Monocytes Relative: 14 %
Neutro Abs: 2.9 10*3/uL (ref 1.7–7.7)
Neutrophils Relative %: 54 %
Platelets: 111 10*3/uL — ABNORMAL LOW (ref 150–400)
RBC: 2.44 MIL/uL — ABNORMAL LOW (ref 3.87–5.11)
RDW: 18.6 % — ABNORMAL HIGH (ref 11.5–15.5)
WBC: 5.4 10*3/uL (ref 4.0–10.5)
nRBC: 0.4 % — ABNORMAL HIGH (ref 0.0–0.2)

## 2022-05-06 LAB — BPAM RBC
Blood Product Expiration Date: 202312052359
Blood Product Expiration Date: 202312052359
Blood Product Expiration Date: 202312052359
Blood Product Expiration Date: 202312052359
Blood Product Expiration Date: 202312052359
Blood Product Expiration Date: 202312052359
ISSUE DATE / TIME: 202311030141
ISSUE DATE / TIME: 202311031205
ISSUE DATE / TIME: 202311032110
ISSUE DATE / TIME: 202311041928
Unit Type and Rh: 5100
Unit Type and Rh: 5100
Unit Type and Rh: 5100
Unit Type and Rh: 5100
Unit Type and Rh: 5100
Unit Type and Rh: 5100

## 2022-05-06 LAB — TYPE AND SCREEN
ABO/RH(D): O POS
Antibody Screen: NEGATIVE
Unit division: 0
Unit division: 0
Unit division: 0
Unit division: 0
Unit division: 0
Unit division: 0

## 2022-05-06 LAB — IRON AND TIBC
Iron: 87 ug/dL (ref 28–170)
Saturation Ratios: 37 % — ABNORMAL HIGH (ref 10.4–31.8)
TIBC: 232 ug/dL — ABNORMAL LOW (ref 250–450)
UIBC: 145 ug/dL

## 2022-05-06 LAB — HEMOGLOBIN AND HEMATOCRIT, BLOOD
HCT: 23 % — ABNORMAL LOW (ref 36.0–46.0)
HCT: 23.4 % — ABNORMAL LOW (ref 36.0–46.0)
HCT: 24.5 % — ABNORMAL LOW (ref 36.0–46.0)
Hemoglobin: 7.3 g/dL — ABNORMAL LOW (ref 12.0–15.0)
Hemoglobin: 7.4 g/dL — ABNORMAL LOW (ref 12.0–15.0)
Hemoglobin: 7.7 g/dL — ABNORMAL LOW (ref 12.0–15.0)

## 2022-05-06 LAB — BASIC METABOLIC PANEL
Anion gap: 3 — ABNORMAL LOW (ref 5–15)
BUN: 5 mg/dL — ABNORMAL LOW (ref 8–23)
CO2: 27 mmol/L (ref 22–32)
Calcium: 7.8 mg/dL — ABNORMAL LOW (ref 8.9–10.3)
Chloride: 110 mmol/L (ref 98–111)
Creatinine, Ser: 0.43 mg/dL — ABNORMAL LOW (ref 0.44–1.00)
GFR, Estimated: >60 mL/min (ref >60)
Glucose, Bld: 114 mg/dL — ABNORMAL HIGH (ref 70–99)
Potassium: 4 mmol/L (ref 3.5–5.1)
Sodium: 140 mmol/L (ref 135–145)

## 2022-05-06 LAB — FERRITIN: Ferritin: 45 ng/mL (ref 11–307)

## 2022-05-06 LAB — FOLATE: Folate: 19.6 ng/mL (ref >5.9)

## 2022-05-06 LAB — VITAMIN B12: Vitamin B-12: 348 pg/mL (ref 180–914)

## 2022-05-06 LAB — RETICULOCYTES
Immature Retic Fract: 41 % — ABNORMAL HIGH (ref 2.3–15.9)
RBC.: 2.54 MIL/uL — ABNORMAL LOW (ref 3.87–5.11)
Retic Count, Absolute: 96.8 10*3/uL (ref 19.0–186.0)
Retic Ct Pct: 3.8 % — ABNORMAL HIGH (ref 0.4–3.1)

## 2022-05-06 MED ORDER — METOPROLOL TARTRATE 25 MG PO TABS: 12.5000 mg | ORAL_TABLET | Freq: Two times a day (BID) | ORAL | Status: DC | PRN

## 2022-05-06 MED ORDER — METOPROLOL TARTRATE 25 MG PO TABS
12.5000 mg | ORAL_TABLET | Freq: Two times a day (BID) | ORAL | Status: DC
  Administered 2022-05-06 – 2022-05-07 (×2): 12.5 mg via ORAL
  Filled 2022-05-06 (×2): qty 1

## 2022-05-06 MED ORDER — METOPROLOL TARTRATE 25 MG PO TABS: 12.5000 mg | ORAL_TABLET | Freq: Two times a day (BID) | ORAL | Status: DC

## 2022-05-06 MED ORDER — HYDROXYZINE HCL 10 MG PO TABS
10.0000 mg | ORAL_TABLET | Freq: Three times a day (TID) | ORAL | Status: DC | PRN
  Administered 2022-05-06: 10 mg via ORAL
  Filled 2022-05-06 (×2): qty 1

## 2022-05-06 NOTE — Progress Notes (Signed)
Nutrition Brief Note  Patient identified on the Malnutrition Screening Tool (MST) Report  Patient currently on regular diet, consuming 75-100% of meals at this time.  No significant weight changes noted in weight records over the past year.   Wt Readings from Last 15 Encounters:  05/03/22 77.7 kg  05/01/22 77.7 kg  04/18/22 77.7 kg  03/19/22 77.7 kg  02/05/22 76.2 kg  02/01/22 76.4 kg  01/18/22 73 kg  01/14/22 73.4 kg  05/03/21 81.6 kg  02/01/21 81.6 kg  12/22/20 83.6 kg  08/26/18 78.1 kg  12/22/17 84.8 kg  11/18/17 83.1 kg  09/14/17 78.2 kg    Body mass index is 32.37 kg/m. Patient meets criteria for obesity based on current BMI.   Current diet order is regular, patient is consuming approximately 100% of meals at this time. Labs and medications reviewed.   No nutrition interventions warranted at this time. If nutrition issues arise, please consult RD.   Clayton Bibles, MS, RD, LDN Inpatient Clinical Dietitian Contact information available via Amion

## 2022-05-06 NOTE — Progress Notes (Signed)
[facility]                                                                               [name], is a 81 y.o. female, DOB - 1940/07/19, TJQ:[id number] Admit date - 05/02/2022    Outpatient Primary MD for the patient is Cleophas Dunker, [name], NP  LOS - 3  days    Brief summary   [name] is a 81 y.o. 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   Patient reports having brisk rectal bleeding for the past 3 to 4 days when having a bowel movement.  Has felt increased abdominal distention but no pain.  No nausea or vomiting. She has history of diverticular bleed in 11/2020 s/p embolization with recurrence on 05/2021 that did not require intervention.  She was then admitted in 12/2021 requiring transfusion.  She underwent EGD with no active signs of bleeding.   She followed up with her PCP  on 05/02/2022 and was noted to have downward trending hemoglobin of 11.2 yesterday down to 10.2 today with continuous rectal bleeding and was advised to present to the ED. She continued to have rectal bleeding and her hemoglobin dropped to 7.4, 2 units of prbc transfusion given in the last 24 hours.  Hemoglobin at 7.3 today. She also underwent colonoscopy , results reviewed with the patient and the son over the phone, unable to reach the patien'ts husband.      Assessment & Plan    Assessment and Plan:*    Acute GI bleeding -history of diverticular bleed in 11/2020 s/p embolization with recurrence on 05/2021 that did not require intervention.  She was then admitted in 12/2021 with recurrent GI bleed requiring transfusion.   She underwent EGD with no active signs of bleeding. -Hgb has downward trend from 11.2 (11/1) --> 10.2 --> 8.9 on presentation -->7.7. to 7.1 to 8  to 7.4 to 7.6 to 7.3 to 8.3 to 7.9 to 8.2  -Guiac positive stools.  - s/p a total of  3 units of Prbc transfusion. 1 more unit of prbc ordered. Anemia panel unremarkable.  - stat CT angio of the abdomen twice did not show nay evidence of acute GI bleed. Severe diverticulosis of the distal colon without active inflammatory changes. No bowel obstruction. - NM bleeding scan showed active bleed at the site of splenic flexure of the colon . Discussed with IR on call Dr Maryelizabeth Kaufmann,  bases on previous diverticular bleed and history , suggested intermittent bleeding, recommended to observe overnight and if she bleeds or becomes unstable , to go ahead with CTA and call IR for embolization.  - she underwent colonoscopy showing blood in the entire colon. Prep was poor. Diffuse diverticulosis. 2 polyps from the transverse colon removed. - General surgery consulted to see if she is a candidate for colectomy.  - bleeding appears to have stopped for now.  - hemoglobin stable between 7.5 to 8.  - she was started on a diet and able to tolerate without any issues.  - if her H&H remains stable , plan for discharge  tomorrow.      Hypotension BP parameters have improved. Marland Kitchen   Restless leg syndrome Continue home meds  COPD GOLD II  Stable.  Continue home bronchodilator. No wheezing heard on exam.  - Continue nocturnal O2 2 L  Hypokalemia:  Replaced.      Estimated body mass index is 32.37 kg/m as calculated from the following:   Height as of this encounter: '5\' 1"'$  (1.549 m).   Weight as of this encounter: 77.7 kg.  Code Status: full code.  DVT Prophylaxis:  SCDs Start: 05/02/22 2316   Level of Care: Level of care: Telemetry Family Communication: none at bedside.   Disposition Plan:     Remains inpatient appropriate: GI work up for blood oss anemia.   Procedures:  None.  Consultants:   GASTROENTEROLOGY  Antimicrobials:   Anti-infectives (From admission, onward)    None        Medications  Scheduled Meds:  sodium chloride   Intravenous Once   sodium chloride    Intravenous Once   sodium chloride   Intravenous Once   acetaminophen  1,000 mg Oral BID   acidophilus  1 capsule Oral QHS   vitamin C  1,000 mg Oral q morning   cholecalciferol  2,000 Units Oral q morning   dorzolamide-timolol  1 drop Both Eyes BID   ferrous gluconate  324 mg Oral q morning   latanoprost  1 drop Both Eyes QHS   loratadine  10 mg Oral Daily   melatonin  3 mg Oral Once   mirabegron ER  25 mg Oral Daily   Netarsudil Dimesylate  1 drop Both Eyes QHS   pantoprazole (PROTONIX) IV  40 mg Intravenous Q12H   sertraline  100 mg Oral Daily   simvastatin  20 mg Oral Daily   umeclidinium-vilanterol  1 puff Inhalation Daily   Continuous Infusions:  lactated ringers 100 mL/hr at 05/06/22 0636   PRN Meds:.albuterol, mouth rinse, rOPINIRole    Subjective:   [name] was seen and examined today. No new complaints today.  Objective:   Vitals:   05/05/22 1925 05/05/22 2348 05/06/22 0600 05/06/22 1221  BP: (!) 144/52 (!) 106/51 (!) 131/58 (!) 110/43  Pulse: (!) 102 80 81 86  Resp: '20 16 20 16  '$ Temp: 98.6 F (37 C) 98.7 F (37.1 C) 98.7 F (37.1 C) 98.2 F (36.8 C)  TempSrc: Oral Oral Oral Oral  SpO2: 97% 97% 98% 95%  Weight:      Height:        Intake/Output Summary (Last 24 hours) at 05/06/2022 1513 Last data filed at 05/06/2022 1247 Gross per 24 hour  Intake 1820 ml  Output 1850 ml  Net -30 ml    Filed Weights   05/03/22 0344 05/03/22 2230  Weight: 78.3 kg 77.7 kg     Exam General exam: elderly woman, not in distress.  Respiratory system: Clear to auscultation. Respiratory effort normal. Cardiovascular system: S1 & S2 heard, RRR. No JVD,  No pedal edema. Gastrointestinal system: Abdomen is nondistended, soft and nontender.  Normal bowel sounds heard. Central nervous system: Alert and oriented. No focal neurological deficits. Extremities: Symmetric 5 x 5 power. Skin: No rashes, lesions or ulcers Psychiatry: Mood & affect appropriate.      Data  Reviewed:  I have personally reviewed following labs and imaging studies   CBC Lab Results  Component Value Date   WBC 5.4 05/06/2022   RBC 2.54 (L) 05/06/2022   HGB 7.7 (L)  05/06/2022   HCT 24.5 (L) 05/06/2022   MCV 95.1 05/06/2022   MCH 30.3 05/06/2022   PLT 111 (L) 05/06/2022   MCHC 31.9 05/06/2022   RDW 18.6 (H) 05/06/2022   LYMPHSABS 1.5 05/06/2022   MONOABS 0.8 05/06/2022   EOSABS 0.2 05/06/2022   BASOSABS 0.0 14/97/0263     Last metabolic panel Lab Results  Component Value Date   NA 140 05/06/2022   K 4.0 05/06/2022   CL 110 05/06/2022   CO2 27 05/06/2022   BUN 5 (L) 05/06/2022   CREATININE 0.43 (L) 05/06/2022   GLUCOSE 114 (H) 05/06/2022   GFRNONAA >60 05/06/2022   GFRAA >60 09/14/2017   CALCIUM 7.8 (L) 05/06/2022   PHOS 3.2 05/05/2021   PROT 6.1 (L) 05/02/2022   ALBUMIN 3.5 05/02/2022   BILITOT 0.4 05/02/2022   ALKPHOS 46 05/02/2022   AST 18 05/02/2022   ALT 14 05/02/2022   ANIONGAP 3 (L) 05/06/2022    CBG (last 3)  No results for input(s): "GLUCAP" in the last 72 hours.    Coagulation Profile: No results for input(s): "INR", "PROTIME" in the last 168 hours.   Radiology Studies: No results found.     Hosie Poisson M.D. [facility] 05/06/2022, 3:13 PM  Available via Epic secure chat 7am-7pm After 7 pm, please refer to night coverage provider listed on amion.

## 2022-05-06 NOTE — Progress Notes (Signed)
2 Days Post-Op   Subjective/Chief Complaint: No further bleeding. Denies pain.   Objective: Vital signs in last 24 hours: Temp:  [98.5 F (36.9 C)-98.7 F (37.1 C)] 98.7 F (37.1 C) (11/06 0600) Pulse Rate:  [80-111] 81 (11/06 0600) Resp:  [15-24] 20 (11/06 0600) BP: (106-144)/(35-58) 131/58 (11/06 0600) SpO2:  [96 %-98 %] 98 % (11/06 0600) Last BM Date : 05/04/22  Intake/Output from previous day: 11/05 0701 - 11/06 0700 In: 2180 [P.O.:1080; I.V.:1100] Out: 2480 [Urine:2480] Intake/Output this shift: No intake/output data recorded.  Ab soft nontender nondistended  Lab Results:  Recent Labs    05/05/22 0631 05/05/22 1056 05/06/22 0655 05/06/22 0656  WBC 6.9  --  5.4  --   HGB 7.9*   < > 7.4* 7.4*  HCT 24.8*   < > 23.2* 23.4*  PLT 107*  --  111*  --    < > = values in this interval not displayed.    BMET Recent Labs    05/05/22 0631 05/06/22 0655  NA 137 140  K 3.4* 4.0  CL 111 110  CO2 24 27  GLUCOSE 119* 114*  BUN 9 5*  CREATININE 0.46 0.43*  CALCIUM 7.5* 7.8*    PT/INR No results for input(s): "LABPROT", "INR" in the last 72 hours. ABG No results for input(s): "PHART", "HCO3" in the last 72 hours.  Invalid input(s): "PCO2", "PO2"  Studies/Results: No results found.  Anti-infectives: Anti-infectives (From admission, onward)    None       Assessment/Plan: Likely LGI bleed -ct angio x2 negative; bleeding scan with bleeding noted possibly colon, csc 11/4 with old blood and diverticuli throughout colon. Polyps removed.  No active bleeding,  IC valve seen (not sure if any blood from this) -No indication for surgical intervention at this time.  Not sure what is bleeding (and could conceivably still be small bowel although unlikely) and to commit her to surgery would need to be sure.  If she was bleeding uncontrollably operation would be subtotal colectomy with ileostomy which carries significant morbidity.   -right now does not appear she is  bleeding and can monitor. Hgb stable>24 hours now. -when surgery discussed she does not want to proceed with that  We will sign off. Please call if needed.    Clovis Riley MD 05/06/2022

## 2022-05-06 NOTE — Care Management Important Message (Signed)
Important Message  Patient Details IM Letter given Name: [name] MRN: [id number] Date of Birth: 11-05-1940   Medicare Important Message Given:  Yes     Kerin Salen 05/06/2022, 11:29 AM

## 2022-05-06 NOTE — Progress Notes (Signed)
[facility] Gastroenterology Progress Note  [name] OZDGUY 81 y.o. [birth year]  Subjective: Patient seen and examined this morning in bedside chair. Nurse in room.  Patient denies any further rectal bleeding, denies bowel movement yesterday or this morning.  Denies nausea, vomiting, abdominal pain.  Has been passing flatus.  ROS : Review of Systems  Constitutional:  Negative for chills and fever.  Gastrointestinal:  Negative for abdominal pain, nausea and vomiting.      Objective: Vital signs in last 24 hours: Vitals:   05/05/22 2348 05/06/22 0600  BP: (!) 106/51 (!) 131/58  Pulse: 80 81  Resp: 16 20  Temp: 98.7 F (37.1 C) 98.7 F (37.1 C)  SpO2: 97% 98%    Physical Exam:  General:  Alert, cooperative, no distress, appears stated age  Head:  Normocephalic, without obvious abnormality, atraumatic  Eyes:  Anicteric sclera, EOM's intact  Lungs:   Clear to auscultation bilaterally, respirations unlabored  Heart:  Regular rate and rhythm, S1, S2 normal  Abdomen:   Soft, non-tender, bowel sounds active all four quadrants  Extremities: Extremities normal, atraumatic, no  edema       Lab Results: Recent Labs    05/05/22 0008 05/05/22 0631 05/06/22 0655  NA  --  137 140  K  --  3.4* 4.0  CL  --  111 110  CO2  --  24 27  GLUCOSE  --  119* 114*  BUN  --  9 5*  CREATININE  --  0.46 0.43*  CALCIUM  --  7.5* 7.8*  MG 1.9  --   --    No results for input(s): "AST", "ALT", "ALKPHOS", "BILITOT", "PROT", "ALBUMIN" in the last 72 hours. Recent Labs    05/05/22 0631 05/05/22 1056 05/06/22 0655 05/06/22 0656 05/06/22 1018  WBC 6.9  --  5.4  --   --   NEUTROABS 4.5  --  2.9  --   --   HGB 7.9*   < > 7.4* 7.4* 7.7*  HCT 24.8*   < > 23.2* 23.4* 24.5*  MCV 94.7  --  95.1  --   --   PLT 107*  --  111*  --   --    < > = values in this interval not displayed.   No results for input(s): "LABPROT", "INR" in the last 72 hours.   Assessment Hematochezia - Pandiverticulosis noted on  colonoscopy 10/18/2021, without evidence of active bleeding, old blood noted throughout the colon - 2 polyps removed during colonoscopy, path pending - Diverticular bleeding suspected - Received total of 3 units PRBCs this admission - Hemoglobin 7.7 this morning, stable  Plan: Presumed diverticular bleeding hopefully resolved. Tolerating diet, no hematochezia since 11/4. Continue to monitor bowel movements.  Continue daily CBC and transfuse as needed to maintain HGB > 7  GI will follow.  Angelique Holm PA-C 05/06/2022, 12:09 PM  Contact #  [phone]

## 2022-05-07 DIAGNOSIS — I9589 Other hypotension: Secondary | ICD-10-CM | POA: Diagnosis not present

## 2022-05-07 DIAGNOSIS — K625 Hemorrhage of anus and rectum: Secondary | ICD-10-CM

## 2022-05-07 DIAGNOSIS — I959 Hypotension, unspecified: Secondary | ICD-10-CM | POA: Diagnosis not present

## 2022-05-07 DIAGNOSIS — J449 Chronic obstructive pulmonary disease, unspecified: Secondary | ICD-10-CM | POA: Diagnosis not present

## 2022-05-07 DIAGNOSIS — R531 Weakness: Secondary | ICD-10-CM | POA: Diagnosis not present

## 2022-05-07 DIAGNOSIS — K922 Gastrointestinal hemorrhage, unspecified: Secondary | ICD-10-CM | POA: Diagnosis not present

## 2022-05-07 DIAGNOSIS — Z743 Need for continuous supervision: Secondary | ICD-10-CM | POA: Diagnosis not present

## 2022-05-07 DIAGNOSIS — R58 Hemorrhage, not elsewhere classified: Secondary | ICD-10-CM | POA: Diagnosis not present

## 2022-05-07 LAB — HEMOGLOBIN AND HEMATOCRIT, BLOOD
HCT: 24.9 % — ABNORMAL LOW (ref 36.0–46.0)
Hemoglobin: 8 g/dL — ABNORMAL LOW (ref 12.0–15.0)

## 2022-05-07 LAB — SURGICAL PATHOLOGY

## 2022-05-07 MED ORDER — METOPROLOL TARTRATE 25 MG PO TABS: 12.5000 mg | ORAL_TABLET | Freq: Two times a day (BID) | ORAL | 1 refills | Status: DC

## 2022-05-07 NOTE — Progress Notes (Signed)
[facility] on 05[facility]07/22 at 1332

## 2022-05-07 NOTE — TOC Progression Note (Signed)
Transition of Care [facility]) - Progression Note    Patient Details  Name: [name] MRN: [id number] Date of Birth: August 05, 1940  Transition of Care [facility]) CM/SW Lawson, RN Phone Number:[phone]  05/07/2022, 10:26 AM  Clinical Narrative:   Bedside nurse inquiring about how patient will get home. CM at bedside. Patient states that she is from [facility]. CM called Yates Decamp at [facility] to clarify that patient is able to retun to the ALF at [facility]. Message has been left. Will await return call .         Expected Discharge Plan and Services           Expected Discharge Date: 05/07/22                                     Social Determinants of Health (SDOH) Interventions    Readmission Risk Interventions    05/10/2021   11:32 AM  Readmission Risk Prevention Plan  Transportation Screening Complete  PCP or Specialist Appt within 3-5 Days Complete  HRI or Home Care Consult Complete  Social Work Consult for [place] Planning/Counseling Patient refused  Palliative Care Screening Not Applicable  Medication Review [employer] photographer) Complete

## 2022-05-07 NOTE — Discharge Summary (Signed)
Physician Discharge Summary   Patient: [name] MRN: [id number] DOB: 10/22/1940  Admit date:     05/02/2022  Discharge date: 05/07/2022  Discharge Physician: Hosie Poisson   PCP: Yvonna Alanis, NP   Recommendations at discharge:   Discharge Diagnoses: Principal Problem:   Acute GI bleeding Active Problems:   COPD GOLD II    GI bleed   Restless leg syndrome   Hypotension   Hospital Course:    [name] is a 81 y.o. 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   Patient reports having brisk rectal bleeding for the past 3 to 4 days when having a bowel movement.  Has felt increased abdominal distention but no pain.  No nausea or vomiting. She has history of diverticular bleed in 11/2020 s/p embolization with recurrence on 05/2021 that did not require intervention.  She was then admitted in 12/2021 requiring transfusion.  She underwent EGD with no active signs of bleeding.   She followed up with her PCP  on 05/02/2022 and was noted to have downward trending hemoglobin of 11.2 yesterday down to 10.2 today with continuous rectal bleeding and was advised to present to the ED. She continued to have rectal bleeding and her hemoglobin dropped to 7.4, 2 units of prbc transfusion given in the last 24 hours.  Hemoglobin at 7.3 today. She also underwent colonoscopy , results reviewed with the patient and the son over the phone, unable to reach the patien'ts husband.    Assessment and Plan:     Acute GI bleeding -history of diverticular bleed in 11/2020 s/p embolization with recurrence on 05/2021 that did not require intervention.  She was then admitted in 12/2021 with recurrent GI bleed requiring transfusion.   She underwent EGD with no active signs of bleeding. -Hgb has downward trend from 11.2 (11/1) --> 10.2 --> 8.9 on presentation -->7.7. to 7.1 to 8   to 7.4 to 7.6 to 7.3 to 8.3 to 7.9 to 8.2  -Guiac positive stools.  - s/p a total of 3 units of Prbc transfusion. 1 more unit of prbc ordered. Anemia panel unremarkable.  - stat CT angio of the abdomen twice did not show nay evidence of acute GI bleed. Severe diverticulosis of the distal colon without active inflammatory changes. No bowel obstruction. - NM bleeding scan showed active bleed at the site of splenic flexure of the colon . Discussed with IR on call Dr Maryelizabeth Kaufmann,  bases on previous diverticular bleed and history , suggested intermittent bleeding, recommended to observe overnight and if she bleeds or becomes unstable , to go ahead with CTA and call IR for embolization.  - she underwent colonoscopy showing blood in the entire colon. Prep was poor. Diffuse diverticulosis. 2 polyps from the transverse colon removed. - General surgery consulted to see if she is a candidate for colectomy.  - bleeding appears to have stopped for now.  - hemoglobin stable between 7.5 to 8.  - she was started on a diet and able to tolerate without any issues.         Hypotension BP parameters have improved. Marland Kitchen    Restless leg syndrome Continue home meds   COPD GOLD II  Stable.  Continue home bronchodilator. No wheezing heard on exam.  - Continue nocturnal O2 2 L   Hypokalemia:  Replaced.      Estimated body mass index is 32.37 kg/m  as calculated from the following:   Height as of this encounter: '5\' 1"'$  (1.549 m).   Weight as of this encounter: 77.7 kg.     Consultants: gastroenterology IR General surgery.  Procedures performed: COLONOSCOPY  Disposition: Home Diet recommendation:  Discharge Diet Orders (From admission, onward)     Start     Ordered   05/07/22 0000  Diet - low sodium heart healthy        05/07/22 0911           Regular diet DISCHARGE MEDICATION: Allergies as of 05/07/2022       Reactions   Morphine And Related Itching   Abaloparatide Other (See Comments)    Unknown reaction   Aspirin Other (See Comments)   Unknown reaction   Duloxetine Hcl Other (See Comments)   Unknown reaction   Ventolin [albuterol] Other (See Comments)   rapid heart beat   Cefadroxil Hives   Patient can take amoxicillin and cipro        Medication List     STOP taking these medications    HYDROcodone-acetaminophen 5-325 MG tablet Commonly known as: NORCO/VICODIN   propranolol 20 MG tablet Commonly known as: INDERAL       TAKE these medications    acetaminophen 500 MG tablet Commonly known as: TYLENOL Take 1,000 mg by mouth 2 (two) times daily.   albuterol 108 (90 Base) MCG/ACT inhaler Commonly known as: VENTOLIN HFA Inhale 2 puffs into the lungs every 4 (four) hours as needed for wheezing or shortness of breath.   alendronate 70 MG tablet Commonly known as: FOSAMAX Take 70 mg by mouth every Sunday. Take with a full glass of water on an empty stomach.   Align 4 MG Caps Take 4 mg by mouth at bedtime.   Anoro Ellipta 62.5-25 MCG/ACT Aepb Generic drug: umeclidinium-vilanterol Inhale 1 puff into the lungs every morning.   docusate sodium 100 MG capsule Commonly known as: COLACE Take 200 mg by mouth at bedtime.   dorzolamide-timolol 2-0.5 % ophthalmic solution Commonly known as: COSOPT Place 1 drop into both eyes 2 (two) times daily.   ferrous gluconate 240 (27 FE) MG tablet Commonly known as: FERGON Take 240 mg by mouth every morning. Take without food   hydrocortisone 2.5 % rectal cream Commonly known as: Anusol-HC Place 1 Application rectally 2 (two) times daily for 7 days.   latanoprost 0.005 % ophthalmic solution Commonly known as: XALATAN Place 1 drop into both eyes at bedtime.   loratadine 10 MG tablet Commonly known as: CLARITIN Take 1 tablet (10 mg total) by mouth daily.   metoprolol tartrate 25 MG tablet Commonly known as: LOPRESSOR Take 0.5 tablets (12.5 mg total) by mouth 2 (two) times daily.   Myrbetriq 25 MG Tb24  tablet Generic drug: mirabegron ER Take 25 mg by mouth daily.   OXYGEN 2lpm with sleep only  [employer]   pantoprazole 40 MG tablet Commonly known as: PROTONIX Take 40 mg by mouth 2 (two) times daily.   polyethylene glycol 17 g packet Commonly known as: MIRALAX / GLYCOLAX Take 8.5 g by mouth daily as needed (constipation).   PreserVision AREDS 2 Caps Take 1 capsule by mouth 2 (two) times daily.   PreviDent 5000 Booster Plus 1.1 % Pste Generic drug: Sodium Fluoride Place 1 Application onto teeth See admin instructions. Brush on teeth with a toothbrush after evening mouth care. Spit out excess and do not rinse.   Rhopressa 0.02 % Soln Generic drug: Netarsudil Dimesylate  Place 1 drop into both eyes at bedtime.   rOPINIRole 1 MG tablet Commonly known as: REQUIP Take 0.5 mg by mouth at bedtime as needed (restless legs/tremors).   sertraline 100 MG tablet Commonly known as: ZOLOFT Take 1 tablet (100 mg total) by mouth daily.   simvastatin 20 MG tablet Commonly known as: ZOCOR Take 20 mg by mouth daily.   vitamin C 1000 MG tablet Take 1,000 mg by mouth every morning.   Vitamin D3 50 MCG (2000 UT) Tabs Take 2,000 Units by mouth every morning.   zinc oxide 20 % ointment Apply 1 Application topically See admin instructions. Apply topically to buttocks after every incontinent episode and as needed for redness        Follow-up Information     [name], [name], NP. Schedule an appointment as soon as possible for a visit in 1 week(s).   Specialty: Adult Health Nurse Practitioner Contact information: [address] [address] [phone]                Discharge Exam: Danley Danker Weights   05/03/22 0344 05/03/22 2230  Weight: 78.3 kg 77.7 kg   General exam: Appears calm and comfortable  Respiratory system: Clear to auscultation. Respiratory effort normal. Cardiovascular system: S1 & S2 heard, RRR. No JVD, murmurs, rubs, gallops or clicks. No pedal  edema. Gastrointestinal system: Abdomen is nondistended, soft and nontender. No organomegaly or masses felt. Normal bowel sounds heard. Central nervous system: Alert and oriented. No focal neurological deficits. Extremities: Symmetric 5 x 5 power. Skin: No rashes, lesions or ulcers Psychiatry: Judgement and insight appear normal. Mood & affect appropriate.    Condition at discharge: fair  The results of significant diagnostics from this hospitalization (including imaging, microbiology, ancillary and laboratory) are listed below for reference.   Imaging Studies: NM GI Blood Loss  Result Date: 05/03/2022 CLINICAL DATA:  GI bleed EXAM: NUCLEAR MEDICINE GASTROINTESTINAL BLEEDING SCAN TECHNIQUE: Sequential abdominal images were obtained following intravenous administration of Tc-53mlabeled red blood cells. RADIOPHARMACEUTICALS:  20.5 mCi Tc-993mertechnetate in-vitro labeled red cells. COMPARISON:  CT 05/03/2022, 12/19/2020 FINDINGS: Sequential images were obtained over the course of 1 hour. Later images are degraded by patient motion. At the start of the exam, there is faint activity present within the distal transverse colon, splenic flexure and descending colon. There is progressive activity at the splenic flexure and descending colon. IMPRESSION: Motion degradation. Positive for active GI bleeding involving the lower GI tract. Exact site difficult to pinpoint but suspected source is splenic flexure of the colon. These results will be called to the ordering clinician or representative by the Radiologist Assistant, and communication documented in the PACS or ClFrontier Oil CorporationElectronically Signed   By: KiDonavan Foil.D.   On: 05/03/2022 18:04   CT ANGIO GI BLEED  Result Date: 05/03/2022 CLINICAL DATA:  History of diverticular bleeding requiring embolization presents with rectal bleeding. EXAM: CTA ABDOMEN AND PELVIS WITHOUT AND WITH CONTRAST TECHNIQUE: Multidetector CT imaging of the abdomen and  pelvis was performed using the standard protocol during bolus administration of intravenous contrast. Multiplanar reconstructed images and MIPs were obtained and reviewed to evaluate the vascular anatomy. RADIATION DOSE REDUCTION: This exam was performed according to the departmental dose-optimization program which includes automated exposure control, adjustment of the mA and/or kV according to patient size and/or use of iterative reconstruction technique. CONTRAST:  <See Chart> OMNIPAQUE IOHEXOL 350 MG/ML SOLN COMPARISON:  Same-day CTA abdomen/pelvis. FINDINGS: VASCULAR Aorta: Moderate atherosclerotic plaque  is again seen in the nonaneurysmal abdominal aorta. There is no evidence of dissection, vasculitis, or significant stenosis. Celiac: Patent without evidence of aneurysm, dissection, vasculitis or significant stenosis. SMA: Patent without evidence of aneurysm, dissection, vasculitis or significant stenosis. Renals: There is atherosclerotic plaque of the origin of the renal arteries bilaterally resulting in moderate to severe stenosis bilaterally, right worse than left. The renal arteries are patent distally. There is no evidence of aneurysm, dissection, vasculitis, or fibromuscular dysplasia. IMA: Patent without evidence of aneurysm, dissection, vasculitis or significant stenosis. Inflow: Choose Proximal Outflow: Bilateral common femoral and visualized portions of the superficial and profunda femoral arteries are patent without evidence of aneurysm, dissection, vasculitis or significant stenosis. Veins: The main portal and splenic veins are patent. There is no evidence of active extravasation. Review of the MIP images confirms the above findings. NON-VASCULAR Lower chest: The lung bases are clear. Coronary artery calcification is again seen. Hepatobiliary: The liver and gallbladder are unremarkable. Pancreas: Unremarkable. Spleen: Unremarkable. Adrenals/Urinary Tract: Adrenals are unremarkable. A left calyceal  diverticulum is noted with layering contrast dependently. There is no hydronephrosis or hydroureter. There are no suspicious renal lesions. There are no stones. Excreted contrast is seen in the otherwise normal urinary bladder. Stomach/Bowel: The stomach is unremarkable. There is no evidence of bowel obstruction. 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 No active extravasation is seen along the GI tract. Embolization coils are seen in the left upper quadrant presumably related to prior diverticular bleed treatment. Appendix is normal. Lymphatic: There is no abdominal or pelvic lymphadenopathy. Reproductive: The uterus is unremarkable.  There is no adnexal mass. Other: There is no ascites or free air. Musculoskeletal: Severe compression deformity of the L1 vertebral body with vertebra plana is unchanged. There is no acute osseous abnormality or suspicious osseous lesion. IMPRESSION: No evidence of active GI bleed or significant change compared to the study from earlier the same day. Electronically Signed   By: Valetta Mole M.D.   On: 05/03/2022 12:13   CT ANGIO GI BLEED  Result Date: 05/03/2022 CLINICAL DATA:  Lower GI bleed. EXAM: CTA ABDOMEN AND PELVIS WITHOUT AND WITH CONTRAST TECHNIQUE: Multidetector CT imaging of the abdomen and pelvis was performed using the standard protocol during bolus administration of intravenous contrast. Multiplanar reconstructed images and MIPs were obtained and reviewed to evaluate the vascular anatomy. RADIATION DOSE REDUCTION: This exam was performed according to the departmental dose-optimization program which includes automated exposure control, adjustment of the mA and/or kV according to patient size and/or use of iterative reconstruction technique. CONTRAST:  73m OMNIPAQUE IOHEXOL 350 MG/ML SOLN COMPARISON:  GI bleed study dated 12/19/2020. FINDINGS: Evaluation is limited due to streak artifact caused by patient's arms and overlying  support wires. VASCULAR Aorta: Moderate atherosclerotic calcification of the aorta. No aneurysmal dilatation or dissection. No periaortic fluid collection. Celiac: Patent without evidence of aneurysm, dissection, vasculitis or significant stenosis. SMA: Patent without evidence of aneurysm, dissection, vasculitis or significant stenosis. Renals: Atherosclerotic calcification of the origins of the renal arteries. The renal arteries remain patent. IMA: Patent without evidence of aneurysm, dissection, vasculitis or significant stenosis. Inflow: Mild atherosclerotic calcification of the iliac arteries. The iliac arteries are patent. No aneurysmal dilatation or dissection. Proximal Outflow: The visualized proximal femoral arteries are patent. Veins: The IVC is unremarkable. The SMV, splenic vein, and main portal vein are patent. No portal venous gas. Review of the MIP images confirms the above findings. NON-VASCULAR Lower chest: The visualized lung bases are  clear. There is coronary vascular calcification. No intra-abdominal free air or free fluid. Hepatobiliary: The liver is unremarkable. Mild biliary ductal dilatation or mild periportal edema. There is small amount of layering sludge or small stones within the gallbladder. No pericholecystic fluid or evidence of acute cholecystitis by CT. Pancreas: Unremarkable. No pancreatic ductal dilatation or surrounding inflammatory changes. Spleen: Normal in size without focal abnormality. Adrenals/Urinary Tract: The adrenal glands unremarkable. There is no hydronephrosis or nephrolithiasis on either side. There is a 2.7 cm left renal upper pole parapelvic cyst. The visualized ureters and urinary bladder appear unremarkable. Stomach/Bowel: There is severe diverticulosis of the distal colon without active inflammatory changes. There is no bowel obstruction or active inflammation. No evidence of active GI bleed. The appendix is normal. Lymphatic: No adenopathy. Reproductive: The  uterus is grossly unremarkable. No adnexal masses. Other: None Musculoskeletal: Osteopenia with degenerative changes of the spine. No acute osseous pathology. IMPRESSION: 1. No acute intra-abdominal or pelvic pathology. No evidence of active GI bleed. 2. Severe diverticulosis of the distal colon without active inflammatory changes. No bowel obstruction. Normal appendix. Electronically Signed   By: Anner Crete M.D.   On: 05/03/2022 01:12    Microbiology: Results for orders placed or performed during the hospital encounter of 05/02/22  MRSA Next Gen by PCR, Nasal     Status: None   Collection Time: 05/03/22 10:39 PM   Specimen: Nasal Mucosa; Nasal Swab  Result Value Ref Range Status   MRSA by PCR Next Gen NOT DETECTED NOT DETECTED Final    Comment: (NOTE) The GeneXpert MRSA Assay (FDA approved for NASAL specimens only), is one component of a comprehensive MRSA colonization surveillance program. It is not intended to diagnose MRSA infection nor to guide or monitor treatment for MRSA infections. Test performance is not FDA approved in patients less than 35 years old. Performed at [facility], [place] [address], [place], [place] [address]     Labs: CBC: Recent Labs  Lab 05/03/22 1830 05/03/22 2121 05/04/22 0752 05/04/22 1532 05/05/22 0008 05/05/22 0631 05/05/22 1056 05/05/22 1824 05/05/22 2341 05/06/22 0655 05/06/22 0656 05/06/22 1018  WBC 7.1  --  7.0 6.7  --  6.9  --   --   --  5.4  --   --   NEUTROABS 5.1  --  4.5 3.9  --  4.5  --   --   --  2.9  --   --   HGB 7.7*   < > 7.6* 7.3*   < > 7.9*   < > 8.3* 7.3* 7.4* 7.4* 7.7*  HCT 23.8*   < > 23.1* 22.0*   < > 24.8*   < > 25.8* 23.0* 23.2* 23.4* 24.5*  MCV 91.9  --  95.9 94.8  --  94.7  --   --   --  95.1  --   --   PLT 106*  --  101* 114*  --  107*  --   --   --  111*  --   --    < > = values in this interval not displayed.   Basic Metabolic Panel: Recent Labs  Lab 05/03/22 0730 05/03/22 2121  05/04/22 0752 05/05/22 0008 05/05/22 0631 05/06/22 0655  NA 140 138 140  --  137 140  K 3.6 3.6 3.2*  --  3.4* 4.0  CL 111 111 113*  --  111 110  CO2 '24 22 22  '$ --  24 27  GLUCOSE 114* 110*  108*  --  119* 114*  BUN 24* 20 20  --  9 5*  CREATININE 0.32* 0.32* 0.34*  --  0.46 0.43*  CALCIUM 7.8* 7.6* 7.3*  --  7.5* 7.8*  MG  --   --   --  1.9  --   --    Liver Function Tests: Recent Labs  Lab 05/02/22 1839  AST 18  ALT 14  ALKPHOS 46  BILITOT 0.4  PROT 6.1*  ALBUMIN 3.5   CBG: No results for input(s): "GLUCAP" in the last 168 hours.  Discharge time spent: 42 minutes  Signed: Hosie Poisson, MD [facility] 05/07/2022

## 2022-05-07 NOTE — TOC Transition Note (Signed)
Transition of Care [facility]) - CM/SW Discharge Note   Patient Details  Name: [name] MRN: [id number] Date of Birth: 1941/02/18  [facility] CM/SW Contact:  Angelita Ingles, RN Phone Number:[phone]  05/07/2022, 12:07 PM   Clinical Narrative:    CM has confirmed that patient can return to [facility].  Patient to be transported by [facility]. PT will be provided per In house rehab with [facility]. D/c packet is at nurses station. Transportation has been arranged per [facility].   Please call report to [phone].        Patient Goals and CMS Choice        Discharge Placement                       Discharge Plan and Services                                     Social Determinants of Health (SDOH) Interventions     Readmission Risk Interventions    05/10/2021   11:32 AM  Readmission Risk Prevention Plan  Transportation Screening Complete  PCP or Specialist Appt within 3-5 Days Complete  HRI or Home Care Consult Complete  Social Work Consult for [place] Planning/Counseling Patient refused  Palliative Care Screening Not Applicable  Medication Review Press photographer) Complete

## 2022-05-08 ENCOUNTER — Non-Acute Institutional Stay (SKILLED_NURSING_FACILITY): Payer: Medicare HMO | Admitting: Orthopedic Surgery

## 2022-05-08 ENCOUNTER — Encounter: Payer: Self-pay | Admitting: Orthopedic Surgery

## 2022-05-08 DIAGNOSIS — J449 Chronic obstructive pulmonary disease, unspecified: Secondary | ICD-10-CM

## 2022-05-08 DIAGNOSIS — N3946 Mixed incontinence: Secondary | ICD-10-CM

## 2022-05-08 DIAGNOSIS — M159 Polyosteoarthritis, unspecified: Secondary | ICD-10-CM

## 2022-05-08 DIAGNOSIS — I10 Essential (primary) hypertension: Secondary | ICD-10-CM

## 2022-05-08 DIAGNOSIS — F418 Other specified anxiety disorders: Secondary | ICD-10-CM

## 2022-05-08 DIAGNOSIS — K922 Gastrointestinal hemorrhage, unspecified: Secondary | ICD-10-CM | POA: Diagnosis not present

## 2022-05-08 DIAGNOSIS — D500 Iron deficiency anemia secondary to blood loss (chronic): Secondary | ICD-10-CM

## 2022-05-08 DIAGNOSIS — R2681 Unsteadiness on feet: Secondary | ICD-10-CM | POA: Diagnosis not present

## 2022-05-08 DIAGNOSIS — M6281 Muscle weakness (generalized): Secondary | ICD-10-CM | POA: Diagnosis not present

## 2022-05-08 DIAGNOSIS — R69 Illness, unspecified: Secondary | ICD-10-CM | POA: Diagnosis not present

## 2022-05-08 NOTE — Progress Notes (Signed)
Location:  [facility] Room Number: 20/A Place of Service:  ALF [phone]) Provider:  Yvonna Alanis, NP   Yvonna Alanis, NP  Patient Care Team: Yvonna Alanis, NP as PCP - General (Adult Health Nurse Practitioner) Virgie Dad, MD as Consulting Physician (Internal Medicine)  Extended Emergency Contact Information Primary Emergency Contact: Schobert,[name] Address: [address]          [place], [place] [address] Johnnette Litter of [place] Phone: [phone] Mobile Phone: [phone] Relation: Spouse Secondary Emergency Contact: Janika, Jedlicka Mobile Phone: [phone] Relation: Son Interpreter needed? No  Code Status:  Full code Goals of care: Advanced Directive information    05/02/2022    8:05 PM  Advanced Directives  Does Patient Have a Medical Advance Directive? Yes  Type of Paramedic of [place];Living will  Does patient want to make changes to medical advance directive? No - Patient declined  Copy of [place] in Chart? No - copy requested     Chief Complaint  Patient presents with   Hospitalization Follow-up    HPI:  Pt is a 81 y.o. female seen today for f/u s/p hospitalization 11/02-11/07.   11/01 she reported rectal bleeding. 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 11/01 hgb was 11.2, dropped to 10.2 by 11/02. She was sent to the ED due to unresolved/continuous bleeding. Hgb continued to trend down in hospital. She received 3 units PRBC, hgb 8.0 at discharge. Hemoccults positive. EGD performed, no sign of active bleeding. CT angio/abdomen was done twice and did not show active bleeding, severe diverticulosis to distal colon noted. NM bleeding scan showed active bleeding at splenic flexure, embolization was not performed. 11/03 she underwent colonoscopy, 2 polys removed. General surgery was consulted, no surgical intervention recommended. Patient was also  very clear she did not want surgical intervention. Bleeding subsided while hospitalized. She was able to tolerate foods and move bowels. Remains on Protonix 40 mg BID. Discharged back to [facility]- assisted living.   Today, she denies rectal bleeding. Admits to feeling tired/weak. Ambulating with walker. No recent falls/injuries. PT is ordered. Vitals stable.   Reports increased left hand pain related to arthritis. Would like OT evaluation for possible splint.    Past Medical History:  Diagnosis Date   Anxiety    Arthritis    "bilateral knees, shoulders, elbows; neck, pretty widespread" (09/05/2017)   BPPV (benign paroxysmal positional vertigo)    Depression    GERD (gastroesophageal reflux disease)    Glaucoma, both eyes    Headache    "probably 2/month" (09/05/2017)   History of blood transfusion ~ 2008   "related to LGIB"   Hyperlipemia    Lower GI bleeding ~ 2008; 09/05/2017   "had to have blood transfusion"   Macular degeneration, bilateral    Osteopenia    Seasonal allergies    Skin cancer, basal cell 2001   "off my nose, left side"   Sleeping excessive    Tinnitus of both ears    Past Surgical History:  Procedure Laterality Date   BALLOON DILATION N/A 05/08/2021   Procedure: BALLOON DILATION;  Surgeon: Ronnette Juniper, MD;  Location: [place];  Service: Gastroenterology;  Laterality: N/A;   BASAL CELL CARCINOMA EXCISION  2001   "off my nose, left side"   BIOPSY  05/08/2021   Procedure: BIOPSY;  Surgeon: Ronnette Juniper, MD;  Location: [facility];  Service: Gastroenterology;;  BLEPHAROPLASTY Bilateral    CATARACT EXTRACTION W/ INTRAOCULAR LENS  IMPLANT, BILATERAL Bilateral 1990's   COLONOSCOPY WITH PROPOFOL N/A 05/04/2022   Procedure: COLONOSCOPY WITH PROPOFOL;  Surgeon: Ronnette Juniper, MD;  Location: [facility];  Service: Gastroenterology;  Laterality: N/A;   ESOPHAGOGASTRODUODENOSCOPY (EGD) WITH PROPOFOL N/A 05/08/2021   Procedure: ESOPHAGOGASTRODUODENOSCOPY (EGD)  WITH PROPOFOL;  Surgeon: Ronnette Juniper, MD;  Location: [facility] [place];  Service: Gastroenterology;  Laterality: N/A;   ESOPHAGOGASTRODUODENOSCOPY (EGD) WITH PROPOFOL N/A 01/11/2022   Procedure: ESOPHAGOGASTRODUODENOSCOPY (EGD) WITH PROPOFOL;  Surgeon: Clarene Essex, MD;  Location: [place];  Service: Gastroenterology;  Laterality: N/A;   EYE SURGERY Bilateral    "to improve vision after cataract OR"   IR ANGIOGRAM FOLLOW UP STUDY  12/19/2020   IR ANGIOGRAM SELECTIVE EACH ADDITIONAL VESSEL  12/19/2020   IR ANGIOGRAM SELECTIVE EACH ADDITIONAL VESSEL  12/19/2020   IR ANGIOGRAM VISCERAL SELECTIVE  12/19/2020   IR EMBO ART  VEN HEMORR LYMPH EXTRAV  INC GUIDE ROADMAPPING  12/19/2020   IR US GUIDE VASC ACCESS RIGHT  12/19/2020   JOINT REPLACEMENT     POLYPECTOMY  05/04/2022   Procedure: POLYPECTOMY;  Surgeon: Ronnette Juniper, MD;  Location: [facility];  Service: Gastroenterology;;   STAPEDES SURGERY Left    "scraped stapedes because it was sticking when it wasn't suppose to"   [place] Left ~ 2008   TOTAL KNEE ARTHROPLASTY  12/20/2011   Procedure: TOTAL KNEE ARTHROPLASTY;  Surgeon: Augustin Schooling, MD;  Location: [facility];  Service: Orthopedics;  Laterality: Right;  Right Total Knee Arthroplasty   TUBAL LIGATION  1980's    Allergies  Allergen Reactions   Morphine And Related Itching   Abaloparatide Other (See Comments)    Unknown reaction   Aspirin Other (See Comments)    Unknown reaction   Duloxetine Hcl Other (See Comments)    Unknown reaction   Ventolin [Albuterol] Other (See Comments)    rapid heart beat   Cefadroxil Hives    Patient can take amoxicillin and cipro    Outpatient Encounter Medications as of 05/08/2022  Medication Sig   acetaminophen (TYLENOL) 500 MG tablet Take 1,000 mg by mouth 2 (two) times daily.   albuterol (VENTOLIN HFA) 108 (90 Base) MCG/ACT inhaler Inhale 2 puffs into the lungs every 4 (four) hours as needed for  wheezing or shortness of breath.   alendronate (FOSAMAX) 70 MG tablet Take 70 mg by mouth every Sunday. Take with a full glass of water on an empty stomach.   Ascorbic Acid (VITAMIN C) 1000 MG tablet Take 1,000 mg by mouth every morning.   Cholecalciferol (VITAMIN D3) 50 MCG (2000 UT) TABS Take 2,000 Units by mouth every morning.   docusate sodium (COLACE) 100 MG capsule Take 200 mg by mouth at bedtime.   dorzolamide-timolol (COSOPT) 22.3-6.8 MG/ML ophthalmic solution Place 1 drop into both eyes 2 (two) times daily.   ferrous gluconate (FERGON) 240 (27 FE) MG tablet Take 240 mg by mouth every morning. Take without food   hydrocortisone (ANUSOL-HC) 2.5 % rectal cream Place 1 Application rectally 2 (two) times daily for 7 days.   latanoprost (XALATAN) 0.005 % ophthalmic solution Place 1 drop into both eyes at bedtime.   loratadine (CLARITIN) 10 MG tablet Take 1 tablet (10 mg total) by mouth daily.   metoprolol tartrate (LOPRESSOR) 25 MG tablet Take 0.5 tablets (12.5 mg total) by mouth 2 (two) times daily.   Multiple Vitamins-Minerals (PRESERVISION AREDS  2) CAPS Take 1 capsule by mouth 2 (two) times daily.   MYRBETRIQ 25 MG TB24 tablet Take 25 mg by mouth daily.   Netarsudil Dimesylate (RHOPRESSA) 0.02 % SOLN Place 1 drop into both eyes at bedtime.   OXYGEN 2lpm with sleep only  [employer]   pantoprazole (PROTONIX) 40 MG tablet Take 40 mg by mouth 2 (two) times daily.   polyethylene glycol (MIRALAX / GLYCOLAX) 17 g packet Take 8.5 g by mouth daily as needed (constipation).   Probiotic Product (ALIGN) 4 MG CAPS Take 4 mg by mouth at bedtime.   rOPINIRole (REQUIP) 1 MG tablet Take 0.5 mg by mouth at bedtime as needed (restless legs/tremors).   sertraline (ZOLOFT) 100 MG tablet Take 1 tablet (100 mg total) by mouth daily.   simvastatin (ZOCOR) 20 MG tablet Take 20 mg by mouth daily.   Sodium Fluoride (PREVIDENT 5000 BOOSTER PLUS) 1.1 % PSTE Place 1 Application onto teeth See admin instructions. Brush on  teeth with a toothbrush after evening mouth care. Spit out excess and do not rinse.   umeclidinium-vilanterol (ANORO ELLIPTA) 62.5-25 MCG/ACT AEPB Inhale 1 puff into the lungs every morning.   zinc oxide 20 % ointment Apply 1 Application topically See admin instructions. Apply topically to buttocks after every incontinent episode and as needed for redness   No facility-administered encounter medications on file as of 05/08/2022.    Review of Systems  Constitutional:  Positive for fatigue. Negative for activity change, appetite change, chills and fever.  HENT:  Negative for congestion and trouble swallowing.   Eyes:  Negative for visual disturbance.  Respiratory:  Negative for cough, shortness of breath and wheezing.   Cardiovascular:  Negative for chest pain and leg swelling.  Gastrointestinal:  Positive for blood in stool. Negative for abdominal distention, abdominal pain, anal bleeding, constipation, diarrhea, nausea and vomiting.  Genitourinary:  Positive for frequency and urgency. Negative for dysuria and hematuria.  Musculoskeletal:  Positive for arthralgias and gait problem.  Skin:  Negative for wound.  Neurological:  Positive for weakness. Negative for dizziness and headaches.  Psychiatric/Behavioral:  Positive for dysphoric mood. Negative for confusion and sleep disturbance. The patient is not nervous/anxious.     Immunization History  Administered Date(s) Administered   Fluad Quad(high Dose 65+) 04/24/2022   Influenza, High Dose Seasonal PF 03/14/2018   PFIZER(Purple Top)SARS-COV-2 Vaccination 07/25/2019, 08/16/2019, 12/12/2021   Pneumococcal-Unspecified 03/02/2011   Tdap 03/12/2018   Zoster Recombinat (Shingrix) 10/08/2017, 12/09/2017   Pertinent  Health Maintenance Due  Topic Date Due   DEXA SCAN  Never done   INFLUENZA VACCINE  Completed      05/05/2022    8:00 AM 05/05/2022    8:00 PM 05/06/2022    9:30 AM 05/06/2022    9:11 PM 05/07/2022   12:25 PM  Fall Risk   Patient Fall Risk Level Moderate fall risk Moderate fall risk Moderate fall risk Moderate fall risk Moderate fall risk   Functional Status Survey:    Vitals:   05/08/22 1315  BP: 106/63  Pulse: 88  Resp: 18  Temp: 97.7 F (36.5 C)  SpO2: 96%  Weight: 171 lb 3.2 oz (77.7 kg)  Height: '5\' 1"'$  (1.549 m)   Body mass index is 32.35 kg/m. Physical Exam Vitals reviewed.  Constitutional:      Comments: Pale complexion  HENT:     Head: Normocephalic.  Eyes:     General:        Right eye: No discharge.  Left eye: No discharge.  Cardiovascular:     Rate and Rhythm: Normal rate and regular rhythm.     Pulses: Normal pulses.     Heart sounds: Normal heart sounds.  Pulmonary:     Effort: Pulmonary effort is normal. No respiratory distress.     Breath sounds: Normal breath sounds. No wheezing.  Abdominal:     General: Bowel sounds are normal. There is no distension.     Palpations: Abdomen is soft.     Tenderness: There is no abdominal tenderness. There is no guarding.  Musculoskeletal:     Left hand: Swelling and tenderness present. Decreased range of motion.     Cervical back: Neck supple.     Right lower leg: No edema.     Left lower leg: No edema.     Comments: Left middle finger with decreased ROM  Skin:    General: Skin is warm and dry.     Capillary Refill: Capillary refill takes less than 2 seconds.  Neurological:     General: No focal deficit present.     Mental Status: She is alert and oriented to person, place, and time.     Motor: Weakness present.     Gait: Gait abnormal.     Comments: walker  Psychiatric:        Mood and Affect: Mood normal.        Behavior: Behavior normal.     Labs reviewed: Recent Labs    01/13/22 0035 01/14/22 0309 05/02/22 1839 05/04/22 0752 05/05/22 0008 05/05/22 0631 05/06/22 0655  NA 141 141   < > 140  --  137 140  K 3.7 3.8   < > 3.2*  --  3.4* 4.0  CL 110 110   < > 113*  --  111 110  CO2 23 26   < > 22  --  24 27   GLUCOSE 123* 125*   < > 108*  --  119* 114*  BUN 10 11   < > 20  --  9 5*  CREATININE 0.59 0.58   < > 0.34*  --  0.46 0.43*  CALCIUM 8.5* 8.5*   < > 7.3*  --  7.5* 7.8*  MG 1.9 2.1  --   --  1.9  --   --    < > = values in this interval not displayed.   Recent Labs    01/08/22 0500 01/11/22 2146 05/02/22 1839  AST '16 23 18  '$ ALT '13 15 14  '$ ALKPHOS 36* 32* 46  BILITOT 0.4 0.4 0.4  PROT 5.0* 4.5* 6.1*  ALBUMIN 3.0* 2.6* 3.5   Recent Labs    05/04/22 1532 05/05/22 0008 05/05/22 0631 05/05/22 1056 05/06/22 0655 05/06/22 0656 05/06/22 1018 05/07/22 0954  WBC 6.7  --  6.9  --  5.4  --   --   --   NEUTROABS 3.9  --  4.5  --  2.9  --   --   --   HGB 7.3*   < > 7.9*   < > 7.4* 7.4* 7.7* 8.0*  HCT 22.0*   < > 24.8*   < > 23.2* 23.4* 24.5* 24.9*  MCV 94.8  --  94.7  --  95.1  --   --   --   PLT 114*  --  107*  --  111*  --   --   --    < > = values in this interval not displayed.  Lab Results  Component Value Date   TSH 1.88 11/18/2017   No results found for: "HGBA1C" Lab Results  Component Value Date   CHOL 147 02/18/2022   HDL 51 02/18/2022   LDLCALC 72 02/18/2022   TRIG 162 (A) 02/18/2022    Significant Diagnostic Results in last 30 days:  NM GI Blood Loss  Result Date: 05/03/2022 CLINICAL DATA:  GI bleed EXAM: NUCLEAR MEDICINE GASTROINTESTINAL BLEEDING SCAN TECHNIQUE: Sequential abdominal images were obtained following intravenous administration of Tc-21mlabeled red blood cells. RADIOPHARMACEUTICALS:  20.5 mCi Tc-987mertechnetate in-vitro labeled red cells. COMPARISON:  CT 05/03/2022, 12/19/2020 FINDINGS: Sequential images were obtained over the course of 1 hour. Later images are degraded by patient motion. At the start of the exam, there is faint activity present within the distal transverse colon, splenic flexure and descending colon. There is progressive activity at the splenic flexure and descending colon. IMPRESSION: Motion degradation. Positive for active GI  bleeding involving the lower GI tract. Exact site difficult to pinpoint but suspected source is splenic flexure of the colon. These results will be called to the ordering clinician or representative by the Radiologist Assistant, and communication documented in the PACS or ClFrontier Oil CorporationElectronically Signed   By: KiDonavan Foil.D.   On: 05/03/2022 18:04   CT ANGIO GI BLEED  Result Date: 05/03/2022 CLINICAL DATA:  History of diverticular bleeding requiring embolization presents with rectal bleeding. EXAM: CTA ABDOMEN AND PELVIS WITHOUT AND WITH CONTRAST TECHNIQUE: Multidetector CT imaging of the abdomen and pelvis was performed using the standard protocol during bolus administration of intravenous contrast. Multiplanar reconstructed images and MIPs were obtained and reviewed to evaluate the vascular anatomy. RADIATION DOSE REDUCTION: This exam was performed according to the departmental dose-optimization program which includes automated exposure control, adjustment of the mA and/or kV according to patient size and/or use of iterative reconstruction technique. CONTRAST:  <See Chart> OMNIPAQUE IOHEXOL 350 MG/ML SOLN COMPARISON:  Same-day CTA abdomen/pelvis. FINDINGS: VASCULAR Aorta: Moderate atherosclerotic plaque is again seen in the nonaneurysmal abdominal aorta. There is no evidence of dissection, vasculitis, or significant stenosis. Celiac: Patent without evidence of aneurysm, dissection, vasculitis or significant stenosis. SMA: Patent without evidence of aneurysm, dissection, vasculitis or significant stenosis. Renals: There is atherosclerotic plaque of the origin of the renal arteries bilaterally resulting in moderate to severe stenosis bilaterally, right worse than left. The renal arteries are patent distally. There is no evidence of aneurysm, dissection, vasculitis, or fibromuscular dysplasia. IMA: Patent without evidence of aneurysm, dissection, vasculitis or significant stenosis. Inflow: Choose  Proximal Outflow: Bilateral common femoral and visualized portions of the superficial and profunda femoral arteries are patent without evidence of aneurysm, dissection, vasculitis or significant stenosis. Veins: The main portal and splenic veins are patent. There is no evidence of active extravasation. Review of the MIP images confirms the above findings. NON-VASCULAR Lower chest: The lung bases are clear. Coronary artery calcification is again seen. Hepatobiliary: The liver and gallbladder are unremarkable. Pancreas: Unremarkable. Spleen: Unremarkable. Adrenals/Urinary Tract: Adrenals are unremarkable. A left calyceal diverticulum is noted with layering contrast dependently. There is no hydronephrosis or hydroureter. There are no suspicious renal lesions. There are no stones. Excreted contrast is seen in the otherwise normal urinary bladder. Stomach/Bowel: The stomach is unremarkable. There is no evidence of bowel obstruction. 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 No active extravasation is seen along the GI tract. Embolization coils are seen in the left upper quadrant presumably related to prior diverticular bleed  treatment. Appendix is normal. Lymphatic: There is no abdominal or pelvic lymphadenopathy. Reproductive: The uterus is unremarkable.  There is no adnexal mass. Other: There is no ascites or free air. Musculoskeletal: Severe compression deformity of the L1 vertebral body with vertebra plana is unchanged. There is no acute osseous abnormality or suspicious osseous lesion. IMPRESSION: No evidence of active GI bleed or significant change compared to the study from earlier the same day. Electronically Signed   By: Valetta Mole M.D.   On: 05/03/2022 12:13   CT ANGIO GI BLEED  Result Date: 05/03/2022 CLINICAL DATA:  Lower GI bleed. EXAM: CTA ABDOMEN AND PELVIS WITHOUT AND WITH CONTRAST TECHNIQUE: Multidetector CT imaging of the abdomen and pelvis was performed  using the standard protocol during bolus administration of intravenous contrast. Multiplanar reconstructed images and MIPs were obtained and reviewed to evaluate the vascular anatomy. RADIATION DOSE REDUCTION: This exam was performed according to the departmental dose-optimization program which includes automated exposure control, adjustment of the mA and/or kV according to patient size and/or use of iterative reconstruction technique. CONTRAST:  53m OMNIPAQUE IOHEXOL 350 MG/ML SOLN COMPARISON:  GI bleed study dated 12/19/2020. FINDINGS: Evaluation is limited due to streak artifact caused by patient's arms and overlying support wires. VASCULAR Aorta: Moderate atherosclerotic calcification of the aorta. No aneurysmal dilatation or dissection. No periaortic fluid collection. Celiac: Patent without evidence of aneurysm, dissection, vasculitis or significant stenosis. SMA: Patent without evidence of aneurysm, dissection, vasculitis or significant stenosis. Renals: Atherosclerotic calcification of the origins of the renal arteries. The renal arteries remain patent. IMA: Patent without evidence of aneurysm, dissection, vasculitis or significant stenosis. Inflow: Mild atherosclerotic calcification of the iliac arteries. The iliac arteries are patent. No aneurysmal dilatation or dissection. Proximal Outflow: The visualized proximal femoral arteries are patent. Veins: The IVC is unremarkable. The SMV, splenic vein, and main portal vein are patent. No portal venous gas. Review of the MIP images confirms the above findings. NON-VASCULAR Lower chest: The visualized lung bases are clear. There is coronary vascular calcification. No intra-abdominal free air or free fluid. Hepatobiliary: The liver is unremarkable. Mild biliary ductal dilatation or mild periportal edema. There is small amount of layering sludge or small stones within the gallbladder. No pericholecystic fluid or evidence of acute cholecystitis by CT. Pancreas:  Unremarkable. No pancreatic ductal dilatation or surrounding inflammatory changes. Spleen: Normal in size without focal abnormality. Adrenals/Urinary Tract: The adrenal glands unremarkable. There is no hydronephrosis or nephrolithiasis on either side. There is a 2.7 cm left renal upper pole parapelvic cyst. The visualized ureters and urinary bladder appear unremarkable. Stomach/Bowel: There is severe diverticulosis of the distal colon without active inflammatory changes. There is no bowel obstruction or active inflammation. No evidence of active GI bleed. The appendix is normal. Lymphatic: No adenopathy. Reproductive: The uterus is grossly unremarkable. No adnexal masses. Other: None Musculoskeletal: Osteopenia with degenerative changes of the spine. No acute osseous pathology. IMPRESSION: 1. No acute intra-abdominal or pelvic pathology. No evidence of active GI bleed. 2. Severe diverticulosis of the distal colon without active inflammatory changes. No bowel obstruction. Normal appendix. Electronically Signed   By: AAnner CreteM.D.   On: 05/03/2022 01:12    Assessment/Plan 1. Acute GI bleeding - hospitalized 11/02- 11/07 - received 3 units PRBC - EGD no active bleeding, NM showed active bleeding to splenic flexure - no embolization performed - colonoscopy removed 2 polyps - hgb 8.0 at discharge - denies rectal bleeding today - ? GI follow up - cont Protonix  40 mg BID for GI protection - cbc/diff- 05/09/2022 & 05/16/2022 & 06/13/2022  2. Iron deficiency anemia due to chronic blood loss - see above - hgb 8.0 (11/07), TIBC 232, ferritin 45, iron 87 - cont ferrous gluconate  3. Primary hypertension - blood pressure low- see above - propranolol stopped - now on metoprolol - blood pressures BID x 10 days  4. COPD GOLD II  - doe snot use oxygen - cont anoro and albuterol prn  5. Mixed stress and urge urinary incontinence - cont assisted living - cont Myrbetriq  6. Primary  osteoarthritis involving multiple joints - involves hand and knees - left middle finger with decreased ROM  - OT evaluation for possible hand splint - cont tylenol and voltaren gel  7. Depression with anxiety - no mood changes - cont Zoloft   Family/ staff Communication: plan discussed with patient and nurse  Labs/tests ordered:  cbc/diff 11/09, 11/16, 12/14, blood pressures BID x 10 days

## 2022-05-09 DIAGNOSIS — R2681 Unsteadiness on feet: Secondary | ICD-10-CM | POA: Diagnosis not present

## 2022-05-09 DIAGNOSIS — E559 Vitamin D deficiency, unspecified: Secondary | ICD-10-CM | POA: Diagnosis not present

## 2022-05-09 DIAGNOSIS — E785 Hyperlipidemia, unspecified: Secondary | ICD-10-CM | POA: Diagnosis not present

## 2022-05-09 DIAGNOSIS — M6281 Muscle weakness (generalized): Secondary | ICD-10-CM | POA: Diagnosis not present

## 2022-05-09 LAB — CBC: RBC: 2.91 — AB (ref 3.87–5.11)

## 2022-05-09 LAB — CBC AND DIFFERENTIAL
HCT: 27 — AB (ref 36–46)
Hemoglobin: 8.7 — AB (ref 12.0–16.0)
Platelets: 188 10*3/uL (ref 150–400)

## 2022-05-10 DIAGNOSIS — R2681 Unsteadiness on feet: Secondary | ICD-10-CM | POA: Diagnosis not present

## 2022-05-10 DIAGNOSIS — M6281 Muscle weakness (generalized): Secondary | ICD-10-CM | POA: Diagnosis not present

## 2022-05-13 DIAGNOSIS — M6281 Muscle weakness (generalized): Secondary | ICD-10-CM | POA: Diagnosis not present

## 2022-05-13 DIAGNOSIS — R2681 Unsteadiness on feet: Secondary | ICD-10-CM | POA: Diagnosis not present

## 2022-05-14 DIAGNOSIS — R2681 Unsteadiness on feet: Secondary | ICD-10-CM | POA: Diagnosis not present

## 2022-05-14 DIAGNOSIS — M6281 Muscle weakness (generalized): Secondary | ICD-10-CM | POA: Diagnosis not present

## 2022-05-15 DIAGNOSIS — H401133 Primary open-angle glaucoma, bilateral, severe stage: Secondary | ICD-10-CM | POA: Diagnosis not present

## 2022-05-15 DIAGNOSIS — H18593 Other hereditary corneal dystrophies, bilateral: Secondary | ICD-10-CM | POA: Diagnosis not present

## 2022-05-15 DIAGNOSIS — H524 Presbyopia: Secondary | ICD-10-CM | POA: Diagnosis not present

## 2022-05-15 DIAGNOSIS — M6281 Muscle weakness (generalized): Secondary | ICD-10-CM | POA: Diagnosis not present

## 2022-05-15 DIAGNOSIS — H50112 Monocular exotropia, left eye: Secondary | ICD-10-CM | POA: Diagnosis not present

## 2022-05-15 DIAGNOSIS — R2681 Unsteadiness on feet: Secondary | ICD-10-CM | POA: Diagnosis not present

## 2022-05-15 DIAGNOSIS — H353131 Nonexudative age-related macular degeneration, bilateral, early dry stage: Secondary | ICD-10-CM | POA: Diagnosis not present

## 2022-05-16 DIAGNOSIS — R2681 Unsteadiness on feet: Secondary | ICD-10-CM | POA: Diagnosis not present

## 2022-05-16 DIAGNOSIS — M6281 Muscle weakness (generalized): Secondary | ICD-10-CM | POA: Diagnosis not present

## 2022-05-16 DIAGNOSIS — D500 Iron deficiency anemia secondary to blood loss (chronic): Secondary | ICD-10-CM | POA: Diagnosis not present

## 2022-05-16 DIAGNOSIS — I959 Hypotension, unspecified: Secondary | ICD-10-CM | POA: Diagnosis not present

## 2022-05-16 DIAGNOSIS — K922 Gastrointestinal hemorrhage, unspecified: Secondary | ICD-10-CM | POA: Diagnosis not present

## 2022-05-16 LAB — CBC AND DIFFERENTIAL
HCT: 25 — AB (ref 36–46)
Hemoglobin: 8 — AB (ref 12.0–16.0)
Neutrophils Absolute: 1944
Neutrophils Absolute: 3900
Platelets: 188 10*3/uL (ref 150–400)
WBC: 4
WBC: 6

## 2022-05-16 LAB — CBC: RBC: 2.62 — AB (ref 3.87–5.11)

## 2022-05-17 ENCOUNTER — Encounter: Payer: Self-pay | Admitting: Orthopedic Surgery

## 2022-05-17 ENCOUNTER — Non-Acute Institutional Stay: Payer: Medicare HMO | Admitting: Orthopedic Surgery

## 2022-05-17 DIAGNOSIS — I10 Essential (primary) hypertension: Secondary | ICD-10-CM

## 2022-05-17 DIAGNOSIS — R2681 Unsteadiness on feet: Secondary | ICD-10-CM | POA: Diagnosis not present

## 2022-05-17 DIAGNOSIS — I959 Hypotension, unspecified: Secondary | ICD-10-CM

## 2022-05-17 DIAGNOSIS — K922 Gastrointestinal hemorrhage, unspecified: Secondary | ICD-10-CM | POA: Diagnosis not present

## 2022-05-17 DIAGNOSIS — M6281 Muscle weakness (generalized): Secondary | ICD-10-CM | POA: Diagnosis not present

## 2022-05-17 DIAGNOSIS — D500 Iron deficiency anemia secondary to blood loss (chronic): Secondary | ICD-10-CM | POA: Diagnosis not present

## 2022-05-17 NOTE — Progress Notes (Signed)
Location:  [facility] Room Number: 20/A Place of Service:  ALF [phone]) Provider:  Yvonna Alanis, NP  Patient Care Team: Yvonna Alanis, NP as PCP - General (Adult Health Nurse Practitioner) Virgie Dad, MD as Consulting Physician (Internal Medicine)  Extended Emergency Contact Information Primary Emergency Contact: Louissaint,[name] Address: [address]          [place], [place] [address] Johnnette Litter of [place] Phone: [phone] Mobile Phone: [phone] Relation: Spouse Secondary Emergency Contact: [place], [place] Mobile Phone: [phone] Relation: Son Interpreter needed? No  Code Status: Prior Goals of care: Advanced Directive information    05/17/2022   10:15 AM  Advanced Directives  Does Patient Have a Medical Advance Directive? No  Does patient want to make changes to medical advance directive? No - Patient declined     Chief Complaint  Patient presents with   Acute Visit    Low Hemoglobin   Quality Metric Gaps    To Discuss the following as listed to the Care Gaps as listed below.     HPI:  Pt is a 81 y.o. female seen today for an acute visit for low hemoglobin.   11/01 she reported rectal bleeding. 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 She was hospitalized 11/02- 11/07 due to acute GI bleed. She received 3 units PRBC. Hemoccults positive. EGD performed, no sign of active bleeding. CT angio/abdomen was done twice and did not show active bleeding, severe diverticulosis to distal colon noted. NM bleeding scan showed active bleeding at splenic flexure, embolization was not performed. 11/03 she underwent colonoscopy, 2 polys removed. Remains on Protonix 40 mg BID.   Hgb 8.0 (11/16)> was 8.7 (11/09). She denies any BRBPR, black tarry stools or abdominal pain. She admits to feeling fatigued and having some diarrhea. Remains on ferrous sulfate.   Blood pressure have trended down since  acute GI bleed. See trends below. She denies dizziness or lightheadedness. No recent falls. She is working with PT.   Recent blood pressures:  11/16- 122/60, 90/48 11/15- 103/57, 100/60, 99/52 11/14- 114/54, 90/47  Concerns of further blood loss discussed with patient. Recommend reporting to ED if hgb continues to drop. She states she will refuse future trips to ED/hospital. She states " I would rather die than go to hospital again." MOST form not completed. Social worker at [employer] notified.    Past Medical History:  Diagnosis Date   Anxiety    Arthritis    "bilateral knees, shoulders, elbows; neck, pretty widespread" (09/05/2017)   BPPV (benign paroxysmal positional vertigo)    Depression    GERD (gastroesophageal reflux disease)    Glaucoma, both eyes    Headache    "probably 2/month" (09/05/2017)   History of blood transfusion ~ 2008   "related to LGIB"   Hyperlipemia    Lower GI bleeding ~ 2008; 09/05/2017   "had to have blood transfusion"   Macular degeneration, bilateral    Osteopenia    Seasonal allergies    Skin cancer, basal cell 2001   "off my nose, left side"   Sleeping excessive    Tinnitus of both ears    Past Surgical History:  Procedure Laterality Date   BALLOON DILATION N/A 05/08/2021   Procedure: BALLOON DILATION;  Surgeon: Ronnette Juniper, MD;  Location: [facility];  Service: Gastroenterology;  Laterality: N/A;   BASAL CELL CARCINOMA EXCISION  2001   "off my nose, left side"  BIOPSY  05/08/2021   Procedure: BIOPSY;  Surgeon: Ronnette Juniper, MD;  Location: [facility] ENDOSCOPY;  Service: Gastroenterology;;   BLEPHAROPLASTY Bilateral    CATARACT EXTRACTION W/ INTRAOCULAR LENS  IMPLANT, BILATERAL Bilateral 1990's   COLONOSCOPY WITH PROPOFOL N/A 05/04/2022   Procedure: COLONOSCOPY WITH PROPOFOL;  Surgeon: Ronnette Juniper, MD;  Location: [facility] ENDOSCOPY;  Service: Gastroenterology;  Laterality: N/A;   ESOPHAGOGASTRODUODENOSCOPY (EGD) WITH PROPOFOL N/A 05/08/2021   Procedure:  ESOPHAGOGASTRODUODENOSCOPY (EGD) WITH PROPOFOL;  Surgeon: Ronnette Juniper, MD;  Location: [facility];  Service: Gastroenterology;  Laterality: N/A;   ESOPHAGOGASTRODUODENOSCOPY (EGD) WITH PROPOFOL N/A 01/11/2022   Procedure: ESOPHAGOGASTRODUODENOSCOPY (EGD) WITH PROPOFOL;  Surgeon: Clarene [place], MD;  Location: [facility];  Service: Gastroenterology;  Laterality: N/A;   EYE SURGERY Bilateral    "to improve vision after cataract OR"   IR ANGIOGRAM FOLLOW UP STUDY  12/19/2020   IR ANGIOGRAM SELECTIVE EACH ADDITIONAL VESSEL  12/19/2020   IR ANGIOGRAM SELECTIVE EACH ADDITIONAL VESSEL  12/19/2020   IR ANGIOGRAM VISCERAL SELECTIVE  12/19/2020   IR EMBO ART  VEN HEMORR LYMPH EXTRAV  INC GUIDE ROADMAPPING  12/19/2020   IR US GUIDE VASC ACCESS RIGHT  12/19/2020   JOINT REPLACEMENT     POLYPECTOMY  05/04/2022   Procedure: POLYPECTOMY;  Surgeon: Ronnette Juniper, MD;  Location: [facility] ENDOSCOPY;  Service: Gastroenterology;;   STAPEDES SURGERY Left    "scraped stapedes because it was sticking when it wasn't suppose to"   [place] Left ~ 2008   TOTAL KNEE ARTHROPLASTY  12/20/2011   Procedure: TOTAL KNEE ARTHROPLASTY;  Surgeon: Augustin Schooling, MD;  Location: [facility];  Service: Orthopedics;  Laterality: Right;  Right Total Knee Arthroplasty   TUBAL LIGATION  1980's    Allergies  Allergen Reactions   Morphine And Related Itching   Abaloparatide Other (See Comments)    Unknown reaction   Aspirin Other (See Comments)    Unknown reaction   Duloxetine Hcl Other (See Comments)    Unknown reaction   Ventolin [Albuterol] Other (See Comments)    rapid heart beat   Cefadroxil Hives    Patient can take amoxicillin and cipro    Outpatient Encounter Medications as of 05/17/2022  Medication Sig   acetaminophen (TYLENOL) 500 MG tablet Take 1,000 mg by mouth 2 (two) times daily.   albuterol (VENTOLIN HFA) 108 (90 Base) MCG/ACT inhaler Inhale 2 puffs into the lungs every 4  (four) hours as needed for wheezing or shortness of breath.   alendronate (FOSAMAX) 70 MG tablet Take 70 mg by mouth every Sunday. Take with a full glass of water on an empty stomach.   Ascorbic Acid (VITAMIN C) 1000 MG tablet Take 1,000 mg by mouth every morning.   Cholecalciferol (VITAMIN D3) 50 MCG (2000 UT) TABS Take 2,000 Units by mouth every morning.   docusate sodium (COLACE) 100 MG capsule Take 100 mg by mouth at bedtime.   dorzolamide-timolol (COSOPT) 22.3-6.8 MG/ML ophthalmic solution Place 1 drop into both eyes 2 (two) times daily.   ferrous gluconate (FERGON) 240 (27 FE) MG tablet Take 240 mg by mouth every morning. Take without food   latanoprost (XALATAN) 0.005 % ophthalmic solution Place 1 drop into both eyes at bedtime.   loratadine (CLARITIN) 10 MG tablet Take 1 tablet (10 mg total) by mouth daily.   metoprolol tartrate (LOPRESSOR) 25 MG tablet Take 0.5 tablets (12.5 mg total) by mouth 2 (two) times daily.   Multiple Vitamins-Minerals (  PRESERVISION AREDS 2) CAPS Take 1 capsule by mouth 2 (two) times daily.   MYRBETRIQ 25 MG TB24 tablet Take 25 mg by mouth daily.   Netarsudil Dimesylate (RHOPRESSA) 0.02 % SOLN Place 1 drop into both eyes at bedtime.   pantoprazole (PROTONIX) 40 MG tablet Take 40 mg by mouth 2 (two) times daily.   polyethylene glycol (MIRALAX / GLYCOLAX) 17 g packet Take 8.5 g by mouth daily as needed (constipation).   Probiotic Product (ALIGN) 4 MG CAPS Take 4 mg by mouth at bedtime.   rOPINIRole (REQUIP) 1 MG tablet Take 0.5 mg by mouth at bedtime as needed (restless legs/tremors).   sertraline (ZOLOFT) 100 MG tablet Take 1 tablet (100 mg total) by mouth daily.   simvastatin (ZOCOR) 20 MG tablet Take 20 mg by mouth daily.   Sodium Fluoride (PREVIDENT 5000 BOOSTER PLUS) 1.1 % PSTE Place 1 Application onto teeth See admin instructions. Brush on teeth with a toothbrush after evening mouth care. Spit out excess and do not rinse.   umeclidinium-vilanterol (ANORO  ELLIPTA) 62.5-25 MCG/ACT AEPB Inhale 1 puff into the lungs every morning.   zinc oxide 20 % ointment Apply 1 Application topically See admin instructions. Apply topically to buttocks after every incontinent episode and as needed for redness   [DISCONTINUED] OXYGEN 2lpm with sleep only  [employer]   No facility-administered encounter medications on file as of 05/17/2022.    Review of Systems  Constitutional:  Positive for fatigue. Negative for activity change, appetite change and fever.  HENT:  Negative for congestion.   Eyes:  Negative for visual disturbance.  Respiratory:  Negative for cough, shortness of breath and wheezing.   Cardiovascular:  Negative for chest pain and leg swelling.  Gastrointestinal:  Negative for abdominal distention, abdominal pain, anal bleeding, blood in stool, constipation, diarrhea, nausea and vomiting.  Genitourinary:  Negative for dysuria, frequency and hematuria.  Musculoskeletal:  Positive for arthralgias and gait problem.  Skin:  Negative for wound.  Neurological:  Positive for weakness. Negative for dizziness and light-headedness.  Psychiatric/Behavioral:  Positive for dysphoric mood. Negative for confusion. The patient is nervous/anxious.     Immunization History  Administered Date(s) Administered   Fluad Quad(high Dose 65+) 04/24/2022   Influenza, High Dose Seasonal PF 03/14/2018   PFIZER(Purple Top)SARS-COV-2 Vaccination 07/25/2019, 08/16/2019, 12/12/2021   Pneumococcal-Unspecified 03/02/2011   Tdap 03/12/2018   Zoster Recombinat (Shingrix) 10/08/2017, 12/09/2017   Pertinent  Health Maintenance Due  Topic Date Due   DEXA SCAN  Never done   INFLUENZA VACCINE  Completed      05/05/2022    8:00 PM 05/06/2022    9:30 AM 05/06/2022    9:11 PM 05/07/2022   12:25 PM 05/17/2022   10:19 AM  Fall Risk  Falls in the past year?     1  Was there an injury with Fall?     1  Fall Risk Category Calculator     2  Fall Risk Category     Moderate  Patient  Fall Risk Level Moderate fall risk Moderate fall risk Moderate fall risk Moderate fall risk Moderate fall risk  Patient at Risk for Falls Due to     History of fall(s)  Fall risk Follow up     Falls evaluation completed   Functional Status Survey:    Vitals:   05/17/22 0933  BP: 122/60  Pulse: 75  Resp: 17  Temp: 98.1 F (36.7 C)  SpO2: 93%  Weight: 171 lb 2 oz (77.6  kg)  Height: '5\' 1"'$  (1.549 m)   Body mass index is 32.33 kg/m. Physical Exam Vitals reviewed.  Constitutional:      Comments: Pale complexion  HENT:     Head: Normocephalic.  Eyes:     General:        Right eye: No discharge.        Left eye: No discharge.  Cardiovascular:     Rate and Rhythm: Normal rate and regular rhythm.     Pulses: Normal pulses.     Heart sounds: Normal heart sounds.  Pulmonary:     Effort: Pulmonary effort is normal. No respiratory distress.     Breath sounds: Normal breath sounds. No wheezing.  Abdominal:     General: Bowel sounds are normal. There is no distension.     Palpations: Abdomen is soft. There is no mass.     Tenderness: There is no abdominal tenderness. There is no guarding or rebound.     Hernia: No hernia is present.  Musculoskeletal:     Cervical back: Neck supple.     Right lower leg: No edema.     Left lower leg: No edema.  Skin:    General: Skin is warm and dry.     Capillary Refill: Capillary refill takes less than 2 seconds.  Neurological:     General: No focal deficit present.     Mental Status: She is alert and oriented to person, place, and time.     Motor: Weakness present.     Gait: Gait abnormal.     Comments: walker  Psychiatric:        Mood and Affect: Mood normal.        Behavior: Behavior normal.     Labs reviewed: Recent Labs    01/13/22 0035 01/14/22 0309 05/02/22 1839 05/04/22 0752 05/05/22 0008 05/05/22 0631 05/06/22 0655  NA 141 141   < > 140  --  137 140  K 3.7 3.8   < > 3.2*  --  3.4* 4.0  CL 110 110   < > 113*  --  111  110  CO2 23 26   < > 22  --  24 27  GLUCOSE 123* 125*   < > 108*  --  119* 114*  BUN 10 11   < > 20  --  9 5*  CREATININE 0.59 0.58   < > 0.34*  --  0.46 0.43*  CALCIUM 8.5* 8.5*   < > 7.3*  --  7.5* 7.8*  MG 1.9 2.1  --   --  1.9  --   --    < > = values in this interval not displayed.   Recent Labs    01/08/22 0500 01/11/22 2146 05/02/22 1839  AST '16 23 18  '$ ALT '13 15 14  '$ ALKPHOS 36* 32* 46  BILITOT 0.4 0.4 0.4  PROT 5.0* 4.5* 6.1*  ALBUMIN 3.0* 2.6* 3.5   Recent Labs    05/04/22 1532 05/05/22 0008 05/05/22 0631 05/05/22 1056 05/06/22 0655 05/06/22 0656 05/06/22 1018 05/07/22 0954  WBC 6.7  --  6.9  --  5.4  --   --   --   NEUTROABS 3.9  --  4.5  --  2.9  --   --   --   HGB 7.3*   < > 7.9*   < > 7.4* 7.4* 7.7* 8.0*  HCT 22.0*   < > 24.8*   < > 23.2* 23.4* 24.5* 24.9*  MCV 94.8  --  94.7  --  95.1  --   --   --   PLT 114*  --  107*  --  111*  --   --   --    < > = values in this interval not displayed.   Lab Results  Component Value Date   TSH 1.88 11/18/2017   No results found for: "HGBA1C" Lab Results  Component Value Date   CHOL 147 02/18/2022   HDL 51 02/18/2022   LDLCALC 72 02/18/2022   TRIG 162 (A) 02/18/2022    Significant Diagnostic Results in last 30 days:  NM GI Blood Loss  Result Date: 05/03/2022 CLINICAL DATA:  GI bleed EXAM: NUCLEAR MEDICINE GASTROINTESTINAL BLEEDING SCAN TECHNIQUE: Sequential abdominal images were obtained following intravenous administration of Tc-68mlabeled red blood cells. RADIOPHARMACEUTICALS:  20.5 mCi Tc-949mertechnetate in-vitro labeled red cells. COMPARISON:  CT 05/03/2022, 12/19/2020 FINDINGS: Sequential images were obtained over the course of 1 hour. Later images are degraded by patient motion. At the start of the exam, there is faint activity present within the distal transverse colon, splenic flexure and descending colon. There is progressive activity at the splenic flexure and descending colon. IMPRESSION: Motion  degradation. Positive for active GI bleeding involving the lower GI tract. Exact site difficult to pinpoint but suspected source is splenic flexure of the colon. These results will be called to the ordering clinician or representative by the Radiologist Assistant, and communication documented in the PACS or ClFrontier Oil CorporationElectronically Signed   By: KiDonavan Foil.D.   On: 05/03/2022 18:04   CT ANGIO GI BLEED  Result Date: 05/03/2022 CLINICAL DATA:  History of diverticular bleeding requiring embolization presents with rectal bleeding. EXAM: CTA ABDOMEN AND PELVIS WITHOUT AND WITH CONTRAST TECHNIQUE: Multidetector CT imaging of the abdomen and pelvis was performed using the standard protocol during bolus administration of intravenous contrast. Multiplanar reconstructed images and MIPs were obtained and reviewed to evaluate the vascular anatomy. RADIATION DOSE REDUCTION: This exam was performed according to the departmental dose-optimization program which includes automated exposure control, adjustment of the mA and/or kV according to patient size and/or use of iterative reconstruction technique. CONTRAST:  <See Chart> OMNIPAQUE IOHEXOL 350 MG/ML SOLN COMPARISON:  Same-day CTA abdomen/pelvis. FINDINGS: VASCULAR Aorta: Moderate atherosclerotic plaque is again seen in the nonaneurysmal abdominal aorta. There is no evidence of dissection, vasculitis, or significant stenosis. Celiac: Patent without evidence of aneurysm, dissection, vasculitis or significant stenosis. SMA: Patent without evidence of aneurysm, dissection, vasculitis or significant stenosis. Renals: There is atherosclerotic plaque of the origin of the renal arteries bilaterally resulting in moderate to severe stenosis bilaterally, right worse than left. The renal arteries are patent distally. There is no evidence of aneurysm, dissection, vasculitis, or fibromuscular dysplasia. IMA: Patent without evidence of aneurysm, dissection, vasculitis or  significant stenosis. Inflow: Choose Proximal Outflow: Bilateral common femoral and visualized portions of the superficial and profunda femoral arteries are patent without evidence of aneurysm, dissection, vasculitis or significant stenosis. Veins: The main portal and splenic veins are patent. There is no evidence of active extravasation. Review of the MIP images confirms the above findings. NON-VASCULAR Lower chest: The lung bases are clear. Coronary artery calcification is again seen. Hepatobiliary: The liver and gallbladder are unremarkable. Pancreas: Unremarkable. Spleen: Unremarkable. Adrenals/Urinary Tract: Adrenals are unremarkable. A left calyceal diverticulum is noted with layering contrast dependently. There is no hydronephrosis or hydroureter. There are no suspicious renal lesions. There are no stones. Excreted contrast is seen  in the otherwise normal urinary bladder. Stomach/Bowel: The stomach is unremarkable. There is no evidence of bowel obstruction. 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 No active extravasation is seen along the GI tract. Embolization coils are seen in the left upper quadrant presumably related to prior diverticular bleed treatment. Appendix is normal. Lymphatic: There is no abdominal or pelvic lymphadenopathy. Reproductive: The uterus is unremarkable.  There is no adnexal mass. Other: There is no ascites or free air. Musculoskeletal: Severe compression deformity of the L1 vertebral body with vertebra plana is unchanged. There is no acute osseous abnormality or suspicious osseous lesion. IMPRESSION: No evidence of active GI bleed or significant change compared to the study from earlier the same day. Electronically Signed   By: Valetta Mole M.D.   On: 05/03/2022 12:13   CT ANGIO GI BLEED  Result Date: 05/03/2022 CLINICAL DATA:  Lower GI bleed. EXAM: CTA ABDOMEN AND PELVIS WITHOUT AND WITH CONTRAST TECHNIQUE: Multidetector CT imaging of  the abdomen and pelvis was performed using the standard protocol during bolus administration of intravenous contrast. Multiplanar reconstructed images and MIPs were obtained and reviewed to evaluate the vascular anatomy. RADIATION DOSE REDUCTION: This exam was performed according to the departmental dose-optimization program which includes automated exposure control, adjustment of the mA and/or kV according to patient size and/or use of iterative reconstruction technique. CONTRAST:  29m OMNIPAQUE IOHEXOL 350 MG/ML SOLN COMPARISON:  GI bleed study dated 12/19/2020. FINDINGS: Evaluation is limited due to streak artifact caused by patient's arms and overlying support wires. VASCULAR Aorta: Moderate atherosclerotic calcification of the aorta. No aneurysmal dilatation or dissection. No periaortic fluid collection. Celiac: Patent without evidence of aneurysm, dissection, vasculitis or significant stenosis. SMA: Patent without evidence of aneurysm, dissection, vasculitis or significant stenosis. Renals: Atherosclerotic calcification of the origins of the renal arteries. The renal arteries remain patent. IMA: Patent without evidence of aneurysm, dissection, vasculitis or significant stenosis. Inflow: Mild atherosclerotic calcification of the iliac arteries. The iliac arteries are patent. No aneurysmal dilatation or dissection. Proximal Outflow: The visualized proximal femoral arteries are patent. Veins: The IVC is unremarkable. The SMV, splenic vein, and main portal vein are patent. No portal venous gas. Review of the MIP images confirms the above findings. NON-VASCULAR Lower chest: The visualized lung bases are clear. There is coronary vascular calcification. No intra-abdominal free air or free fluid. Hepatobiliary: The liver is unremarkable. Mild biliary ductal dilatation or mild periportal edema. There is small amount of layering sludge or small stones within the gallbladder. No pericholecystic fluid or evidence of  acute cholecystitis by CT. Pancreas: Unremarkable. No pancreatic ductal dilatation or surrounding inflammatory changes. Spleen: Normal in size without focal abnormality. Adrenals/Urinary Tract: The adrenal glands unremarkable. There is no hydronephrosis or nephrolithiasis on either side. There is a 2.7 cm left renal upper pole parapelvic cyst. The visualized ureters and urinary bladder appear unremarkable. Stomach/Bowel: There is severe diverticulosis of the distal colon without active inflammatory changes. There is no bowel obstruction or active inflammation. No evidence of active GI bleed. The appendix is normal. Lymphatic: No adenopathy. Reproductive: The uterus is grossly unremarkable. No adnexal masses. Other: None Musculoskeletal: Osteopenia with degenerative changes of the spine. No acute osseous pathology. IMPRESSION: 1. No acute intra-abdominal or pelvic pathology. No evidence of active GI bleed. 2. Severe diverticulosis of the distal colon without active inflammatory changes. No bowel obstruction. Normal appendix. Electronically Signed   By: AAnner CreteM.D.   On: 05/03/2022 01:12    Assessment/Plan  1. Acute GI bleeding - hospitalized 11/02- 11/07 - received 3 units PRBC - EGD no active bleeding, NM showed active bleeding to splenic flexure - no embolization performed - colonoscopy removed 2 polyps - hgb 8.0 at discharge - hgb 8.0 (11/16)> was 8.7 (11/09)> 8.0 (11/07) - recommend ED evaluation if bleeding returns - recheck cbc/diff 11/27 - cont Protonix and ferrous sulfate - patient states she will refuse future trips to hospital  2. Iron deficiency anemia due to chronic blood loss - see above  3. Primary hypertension - bp trending down- asymptomatic - will reduce metoprolol to 6.25 mg po BID - blood pressures BID x 7 days    Family/ staff Communication: plan discussed with patient and nurse  Labs/tests ordered:  cbc/diff- 11/27

## 2022-05-19 DIAGNOSIS — M6281 Muscle weakness (generalized): Secondary | ICD-10-CM | POA: Diagnosis not present

## 2022-05-19 DIAGNOSIS — R2681 Unsteadiness on feet: Secondary | ICD-10-CM | POA: Diagnosis not present

## 2022-05-20 ENCOUNTER — Telehealth: Payer: Self-pay | Admitting: *Deleted

## 2022-05-20 DIAGNOSIS — R2681 Unsteadiness on feet: Secondary | ICD-10-CM | POA: Diagnosis not present

## 2022-05-20 DIAGNOSIS — M6281 Muscle weakness (generalized): Secondary | ICD-10-CM | POA: Diagnosis not present

## 2022-05-20 NOTE — Telephone Encounter (Signed)
LMOM to return call.

## 2022-05-20 NOTE — Telephone Encounter (Signed)
She has had a diagnosis of hypertension since as far back as 2012 in her medical chart. She is also on blood pressure medication for awhile. I changed diagnosis to hypotension (low blood pressure).  Please let me know if you have any other questions. Have a great day, Hardy Harcum

## 2022-05-20 NOTE — Telephone Encounter (Signed)
[name], Husband, called and stated that patient's AVS stated that patient has Hypertension.   Patient husband stated that patient never has High Blood Pressure and wonders if the Chart got mixed up  Please Advise.

## 2022-05-21 DIAGNOSIS — M6281 Muscle weakness (generalized): Secondary | ICD-10-CM | POA: Diagnosis not present

## 2022-05-21 DIAGNOSIS — R2681 Unsteadiness on feet: Secondary | ICD-10-CM | POA: Diagnosis not present

## 2022-05-21 NOTE — Telephone Encounter (Signed)
Patient husband Notified and agreed.

## 2022-05-27 DIAGNOSIS — M6281 Muscle weakness (generalized): Secondary | ICD-10-CM | POA: Diagnosis not present

## 2022-05-27 DIAGNOSIS — R2681 Unsteadiness on feet: Secondary | ICD-10-CM | POA: Diagnosis not present

## 2022-05-29 DIAGNOSIS — R2681 Unsteadiness on feet: Secondary | ICD-10-CM | POA: Diagnosis not present

## 2022-05-29 DIAGNOSIS — M6281 Muscle weakness (generalized): Secondary | ICD-10-CM | POA: Diagnosis not present

## 2022-05-30 DIAGNOSIS — M6281 Muscle weakness (generalized): Secondary | ICD-10-CM | POA: Diagnosis not present

## 2022-05-30 DIAGNOSIS — R2681 Unsteadiness on feet: Secondary | ICD-10-CM | POA: Diagnosis not present

## 2022-05-31 DIAGNOSIS — M6281 Muscle weakness (generalized): Secondary | ICD-10-CM | POA: Diagnosis not present

## 2022-05-31 DIAGNOSIS — R2681 Unsteadiness on feet: Secondary | ICD-10-CM | POA: Diagnosis not present

## 2022-06-03 DIAGNOSIS — F330 Major depressive disorder, recurrent, mild: Secondary | ICD-10-CM | POA: Diagnosis not present

## 2022-06-03 DIAGNOSIS — F4001 Agoraphobia with panic disorder: Secondary | ICD-10-CM | POA: Diagnosis not present

## 2022-06-03 DIAGNOSIS — R69 Illness, unspecified: Secondary | ICD-10-CM | POA: Diagnosis not present

## 2022-06-04 DIAGNOSIS — M6281 Muscle weakness (generalized): Secondary | ICD-10-CM | POA: Diagnosis not present

## 2022-06-04 DIAGNOSIS — R2681 Unsteadiness on feet: Secondary | ICD-10-CM | POA: Diagnosis not present

## 2022-06-05 DIAGNOSIS — M6281 Muscle weakness (generalized): Secondary | ICD-10-CM | POA: Diagnosis not present

## 2022-06-05 DIAGNOSIS — R2681 Unsteadiness on feet: Secondary | ICD-10-CM | POA: Diagnosis not present

## 2022-06-06 DIAGNOSIS — M6281 Muscle weakness (generalized): Secondary | ICD-10-CM | POA: Diagnosis not present

## 2022-06-06 DIAGNOSIS — R2681 Unsteadiness on feet: Secondary | ICD-10-CM | POA: Diagnosis not present

## 2022-06-11 DIAGNOSIS — M6281 Muscle weakness (generalized): Secondary | ICD-10-CM | POA: Diagnosis not present

## 2022-06-11 DIAGNOSIS — R2681 Unsteadiness on feet: Secondary | ICD-10-CM | POA: Diagnosis not present

## 2022-06-12 DIAGNOSIS — M6281 Muscle weakness (generalized): Secondary | ICD-10-CM | POA: Diagnosis not present

## 2022-06-12 DIAGNOSIS — R2681 Unsteadiness on feet: Secondary | ICD-10-CM | POA: Diagnosis not present

## 2022-06-13 DIAGNOSIS — M6281 Muscle weakness (generalized): Secondary | ICD-10-CM | POA: Diagnosis not present

## 2022-06-13 DIAGNOSIS — R2681 Unsteadiness on feet: Secondary | ICD-10-CM | POA: Diagnosis not present

## 2022-06-13 DIAGNOSIS — K922 Gastrointestinal hemorrhage, unspecified: Secondary | ICD-10-CM | POA: Diagnosis not present

## 2022-06-13 DIAGNOSIS — D500 Iron deficiency anemia secondary to blood loss (chronic): Secondary | ICD-10-CM | POA: Diagnosis not present

## 2022-06-13 LAB — CBC AND DIFFERENTIAL
HCT: 35 — AB (ref 36–46)
Hemoglobin: 11.3 — AB (ref 12.0–16.0)
Neutrophils Absolute: 2103
Platelets: 160 10*3/uL (ref 150–400)
WBC: 4.3

## 2022-06-13 LAB — CBC: RBC: 3.74 — AB (ref 3.87–5.11)

## 2022-06-14 DIAGNOSIS — L602 Onychogryphosis: Secondary | ICD-10-CM | POA: Diagnosis not present

## 2022-06-14 DIAGNOSIS — M2041 Other hammer toe(s) (acquired), right foot: Secondary | ICD-10-CM | POA: Diagnosis not present

## 2022-06-14 DIAGNOSIS — M2042 Other hammer toe(s) (acquired), left foot: Secondary | ICD-10-CM | POA: Diagnosis not present

## 2022-06-14 DIAGNOSIS — L84 Corns and callosities: Secondary | ICD-10-CM | POA: Diagnosis not present

## 2022-06-14 DIAGNOSIS — M79672 Pain in left foot: Secondary | ICD-10-CM | POA: Diagnosis not present

## 2022-06-14 DIAGNOSIS — M79671 Pain in right foot: Secondary | ICD-10-CM | POA: Diagnosis not present

## 2022-06-17 DIAGNOSIS — M6281 Muscle weakness (generalized): Secondary | ICD-10-CM | POA: Diagnosis not present

## 2022-06-17 DIAGNOSIS — R2681 Unsteadiness on feet: Secondary | ICD-10-CM | POA: Diagnosis not present

## 2022-06-18 DIAGNOSIS — R2681 Unsteadiness on feet: Secondary | ICD-10-CM | POA: Diagnosis not present

## 2022-06-18 DIAGNOSIS — M6281 Muscle weakness (generalized): Secondary | ICD-10-CM | POA: Diagnosis not present

## 2022-06-19 DIAGNOSIS — R2681 Unsteadiness on feet: Secondary | ICD-10-CM | POA: Diagnosis not present

## 2022-06-19 DIAGNOSIS — M6281 Muscle weakness (generalized): Secondary | ICD-10-CM | POA: Diagnosis not present

## 2022-06-20 DIAGNOSIS — R2681 Unsteadiness on feet: Secondary | ICD-10-CM | POA: Diagnosis not present

## 2022-06-20 DIAGNOSIS — M6281 Muscle weakness (generalized): Secondary | ICD-10-CM | POA: Diagnosis not present

## 2022-06-21 ENCOUNTER — Encounter: Payer: Self-pay | Admitting: Internal Medicine

## 2022-06-21 ENCOUNTER — Non-Acute Institutional Stay: Payer: Medicare HMO | Admitting: Internal Medicine

## 2022-06-21 DIAGNOSIS — D500 Iron deficiency anemia secondary to blood loss (chronic): Secondary | ICD-10-CM | POA: Diagnosis not present

## 2022-06-21 DIAGNOSIS — R69 Illness, unspecified: Secondary | ICD-10-CM | POA: Diagnosis not present

## 2022-06-21 DIAGNOSIS — M79645 Pain in left finger(s): Secondary | ICD-10-CM

## 2022-06-21 DIAGNOSIS — M19042 Primary osteoarthritis, left hand: Secondary | ICD-10-CM | POA: Diagnosis not present

## 2022-06-21 DIAGNOSIS — M159 Polyosteoarthritis, unspecified: Secondary | ICD-10-CM | POA: Diagnosis not present

## 2022-06-21 DIAGNOSIS — F418 Other specified anxiety disorders: Secondary | ICD-10-CM

## 2022-06-21 NOTE — Progress Notes (Unsigned)
Location: [place] of Service:  ALF [phone])  Provider:   Code Status:  Goals of Care:     05/17/2022   10:15 AM  Advanced Directives  Does Patient Have a Medical Advance Directive? No  Does patient want to make changes to medical advance directive? No - Patient declined     Chief Complaint  Patient presents with   Acute Visit    HPI: Patient is a 81 y.o. female seen today for an acute visit for Left hand Finger pain.  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  Follows with [facility] Recurrent falls Osteoporosis  Urge incontinence Past Medical History:  Diagnosis Date   Anxiety    Arthritis    "bilateral knees, shoulders, elbows; neck, pretty widespread" (09/05/2017)   BPPV (benign paroxysmal positional vertigo)    Depression    GERD (gastroesophageal reflux disease)    Glaucoma, both eyes    Headache    "probably 2/month" (09/05/2017)   History of blood transfusion ~ 2008   "related to LGIB"   Hyperlipemia    Lower GI bleeding ~ 2008; 09/05/2017   "had to have blood transfusion"   Macular degeneration, bilateral    Osteopenia    Seasonal allergies    Skin cancer, basal cell 2001   "off my nose, left side"   Sleeping excessive    Tinnitus of both ears     Past Surgical History:  Procedure Laterality Date   BALLOON DILATION N/A 05/08/2021   Procedure: BALLOON DILATION;  Surgeon: Ronnette Juniper, MD;  Location: [place];  Service: Gastroenterology;  Laterality: N/A;   BASAL CELL CARCINOMA EXCISION  2001   "off my nose, left  side"   BIOPSY  05/08/2021   Procedure: BIOPSY;  Surgeon: Ronnette Juniper, MD;  Location: [facility];  Service: Gastroenterology;;   BLEPHAROPLASTY Bilateral    CATARACT EXTRACTION W/ INTRAOCULAR LENS  IMPLANT, BILATERAL Bilateral 1990's   COLONOSCOPY WITH PROPOFOL N/A 05/04/2022   Procedure: COLONOSCOPY WITH PROPOFOL;  Surgeon: Ronnette Juniper, MD;  Location: [facility];  Service: Gastroenterology;  Laterality: N/A;   ESOPHAGOGASTRODUODENOSCOPY (EGD) WITH PROPOFOL N/A 05/08/2021   Procedure: ESOPHAGOGASTRODUODENOSCOPY (EGD) WITH PROPOFOL;  Surgeon: Ronnette Juniper, MD;  Location: [place];  Service: Gastroenterology;  Laterality: N/A;   ESOPHAGOGASTRODUODENOSCOPY (EGD) WITH PROPOFOL N/A 01/11/2022   Procedure: ESOPHAGOGASTRODUODENOSCOPY (EGD) WITH PROPOFOL;  Surgeon: Clarene Essex, MD;  Location: [place];  Service: Gastroenterology;  Laterality: N/A;   EYE SURGERY Bilateral    "to improve vision after cataract OR"   IR ANGIOGRAM FOLLOW UP STUDY  12/19/2020   IR ANGIOGRAM SELECTIVE EACH ADDITIONAL VESSEL  12/19/2020   IR ANGIOGRAM SELECTIVE EACH ADDITIONAL VESSEL  12/19/2020   IR ANGIOGRAM VISCERAL SELECTIVE  12/19/2020   IR EMBO ART  VEN HEMORR LYMPH EXTRAV  INC GUIDE ROADMAPPING  12/19/2020   IR US GUIDE VASC ACCESS RIGHT  12/19/2020   JOINT REPLACEMENT     POLYPECTOMY  05/04/2022   Procedure: POLYPECTOMY;  Surgeon: Ronnette Juniper, MD;  Location: [facility];  Service: Gastroenterology;;   STAPEDES SURGERY Left    "scraped stapedes because it was sticking when it wasn't suppose to"   [place] Left ~ 2008   TOTAL KNEE ARTHROPLASTY  12/20/2011   Procedure: TOTAL KNEE ARTHROPLASTY;  Surgeon: Augustin Schooling, MD;  Location: [place];  Service: Orthopedics;  Laterality: Right;  Right Total Knee Arthroplasty   TUBAL LIGATION  1980's    Allergies  Allergen Reactions   Morphine And Related Itching   Abaloparatide Other (See Comments)    Unknown  reaction   Aspirin Other (See Comments)    Unknown reaction   Duloxetine Hcl Other (See Comments)    Unknown reaction   Ventolin [Albuterol] Other (See Comments)    rapid heart beat   Cefadroxil Hives    Patient can take amoxicillin and cipro    Outpatient Encounter Medications as of 06/21/2022  Medication Sig   MYRBETRIQ 25 MG TB24 tablet Take 25 mg by mouth daily.   acetaminophen (TYLENOL) 500 MG tablet Take 1,000 mg by mouth 2 (two) times daily.   albuterol (VENTOLIN HFA) 108 (90 Base) MCG/ACT inhaler Inhale 2 puffs into the lungs every 4 (four) hours as needed for wheezing or shortness of breath.   alendronate (FOSAMAX) 70 MG tablet Take 70 mg by mouth every Sunday. Take with a full glass of water on an empty stomach.   Ascorbic Acid (VITAMIN C) 1000 MG tablet Take 1,000 mg by mouth every morning.   Cholecalciferol (VITAMIN D3) 50 MCG (2000 UT) TABS Take 2,000 Units by mouth every morning.   docusate sodium (COLACE) 100 MG capsule Take 100 mg by mouth at bedtime.   dorzolamide-timolol (COSOPT) 22.3-6.8 MG/ML ophthalmic solution Place 1 drop into both eyes 2 (two) times daily.   ferrous gluconate (FERGON) 240 (27 FE) MG tablet Take 240 mg by mouth every morning. Take without food   latanoprost (XALATAN) 0.005 % ophthalmic solution Place 1 drop into both eyes at bedtime.   loratadine (CLARITIN) 10 MG tablet Take 1 tablet (10 mg total) by mouth daily.   metoprolol tartrate (LOPRESSOR) 25 MG tablet Take 0.5 tablets (12.5 mg total) by mouth 2 (two) times daily. (Patient taking differently: Take 6.25 mg by mouth 2 (two) times daily.)   Multiple Vitamins-Minerals (PRESERVISION AREDS 2) CAPS Take 1 capsule by mouth 2 (two) times daily.   Netarsudil Dimesylate (RHOPRESSA) 0.02 % SOLN Place 1 drop into both eyes at bedtime.   pantoprazole (PROTONIX) 40 MG tablet Take 40 mg by mouth 2 (two) times daily.   polyethylene glycol (MIRALAX / GLYCOLAX) 17 g packet Take 8.5 g by mouth daily as needed  (constipation).   Probiotic Product (ALIGN) 4 MG CAPS Take 4 mg by mouth at bedtime.   rOPINIRole (REQUIP) 1 MG tablet Take 0.5 mg by mouth at bedtime as needed (restless legs/tremors).   sertraline (ZOLOFT) 100 MG tablet Take 1 tablet (100 mg total) by mouth daily.   simvastatin (ZOCOR) 20 MG tablet Take 20 mg by mouth daily.   Sodium Fluoride (PREVIDENT 5000 BOOSTER PLUS) 1.1 % PSTE Place 1 Application onto teeth See admin instructions. Brush on teeth with a toothbrush after evening mouth care. Spit out excess and do not rinse.   umeclidinium-vilanterol (ANORO ELLIPTA) 62.5-25 MCG/ACT AEPB Inhale 1 puff into the lungs every morning.   zinc oxide 20 % ointment Apply 1 Application topically See admin instructions. Apply topically to buttocks after every incontinent  episode and as needed for redness   No facility-administered encounter medications on file as of 06/21/2022.    Review of Systems:  Review of Systems  Constitutional:  Negative for activity change and appetite change.  HENT: Negative.    Respiratory:  Negative for cough and shortness of breath.   Cardiovascular:  Negative for leg swelling.  Gastrointestinal:  Negative for constipation.  Genitourinary: Negative.   Musculoskeletal:  Negative for arthralgias, gait problem and myalgias.  Skin: Negative.   Neurological:  Negative for dizziness and weakness.  Psychiatric/Behavioral:  Negative for confusion, dysphoric mood and sleep disturbance.     Health Maintenance  Topic Date Due   Pneumonia Vaccine 53+ Years old (1 - PCV) 11/14/2003   DEXA SCAN  Never done   COVID-19 Vaccine (4 - 2023-24 season) 03/01/2022   Medicare Annual Wellness (AWV)  03/20/2023   DTaP/Tdap/Td (2 - Td or Tdap) 03/12/2028   INFLUENZA VACCINE  Completed   Zoster Vaccines- Shingrix  Completed   HPV VACCINES  Aged Out    Physical Exam: Vitals:   06/21/22 1800  BP: 107/60  Pulse: 77  Resp: 18  Temp: (!) 97.4 F (36.3 C)  Weight: 171 lb (77.6 kg)    Body mass index is 32.31 kg/m. Physical Exam Vitals reviewed.  Constitutional:      Appearance: Normal appearance.  HENT:     Head: Normocephalic.     Nose: Nose normal.     Mouth/Throat:     Mouth: Mucous membranes are moist.     Pharynx: Oropharynx is clear.  Eyes:     Pupils: Pupils are equal, round, and reactive to light.  Cardiovascular:     Rate and Rhythm: Normal rate and regular rhythm.     Pulses: Normal pulses.     Heart sounds: Normal heart sounds. No murmur heard. Pulmonary:     Effort: Pulmonary effort is normal.     Breath sounds: Normal breath sounds.  Abdominal:     General: Abdomen is flat. Bowel sounds are normal.     Palpations: Abdomen is soft.  Musculoskeletal:        General: No swelling.     Cervical back: Neck supple.     Comments: Left Hand Mild Swelling with Bruising around her third finger with some pain in Metacarpal Area. She cannot Close her Left hand Right hand Normal exam  Skin:    General: Skin is warm.  Neurological:     General: No focal deficit present.     Mental Status: She is alert and oriented to person, place, and time.  Psychiatric:        Mood and Affect: Mood normal.        Thought Content: Thought content normal.     Labs reviewed: Basic Metabolic Panel: Recent Labs    01/13/22 0035 01/14/22 0309 05/02/22 1839 05/04/22 0752 05/05/22 0008 05/05/22 0631 05/06/22 0655  NA 141 141   < > 140  --  137 140  K 3.7 3.8   < > 3.2*  --  3.4* 4.0  CL 110 110   < > 113*  --  111 110  CO2 23 26   < > 22  --  24 27  GLUCOSE 123* 125*   < > 108*  --  119* 114*  BUN 10 11   < > 20  --  9 5*  CREATININE 0.59 0.58   < > 0.34*  --  0.46 0.43*  CALCIUM 8.5*  8.5*   < > 7.3*  --  7.5* 7.8*  MG 1.9 2.1  --   --  1.9  --   --    < > = values in this interval not displayed.   Liver Function Tests: Recent Labs    01/08/22 0500 01/11/22 2146 05/02/22 1839  AST '16 23 18  '$ ALT '13 15 14  '$ ALKPHOS 36* 32* 46  BILITOT 0.4 0.4 0.4   PROT 5.0* 4.5* 6.1*  ALBUMIN 3.0* 2.6* 3.5   No results for input(s): "LIPASE", "AMYLASE" in the last 8760 hours. No results for input(s): "AMMONIA" in the last 8760 hours. CBC: Recent Labs    05/04/22 1532 05/05/22 0008 05/05/22 0631 05/05/22 1056 05/06/22 0655 05/06/22 0656 05/06/22 1018 05/07/22 0954  WBC 6.7  --  6.9  --  5.4  --   --   --   NEUTROABS 3.9  --  4.5  --  2.9  --   --   --   HGB 7.3*   < > 7.9*   < > 7.4* 7.4* 7.7* 8.0*  HCT 22.0*   < > 24.8*   < > 23.2* 23.4* 24.5* 24.9*  MCV 94.8  --  94.7  --  95.1  --   --   --   PLT 114*  --  107*  --  111*  --   --   --    < > = values in this interval not displayed.   Lipid Panel: Recent Labs    02/18/22 0000  CHOL 147  HDL 51  LDLCALC 72  TRIG 162*   No results found for: "HGBA1C"  Procedures since last visit: No results found.  Assessment/Plan 1. Finger pain, left Xray ordered  ? Arthritis/ Injury Continue Hand Sprint Some swelling Cannot do NSAIDS due to he rh/o GI bleed Will DO prednisone 10 mg for 5 days  2. Primary osteoarthritis involving multiple joints Tylenol prn  3. Iron deficiency anemia due to chronic blood loss On Iron HGB stable Last HGB in 11.3 in facility On Protonix 4. Depression with anxiety Zoloft    Labs/tests ordered:  * No order type specified * Next appt:  Visit date not found

## 2022-06-25 DIAGNOSIS — M6281 Muscle weakness (generalized): Secondary | ICD-10-CM | POA: Diagnosis not present

## 2022-06-25 DIAGNOSIS — R2681 Unsteadiness on feet: Secondary | ICD-10-CM | POA: Diagnosis not present

## 2022-06-26 DIAGNOSIS — M6281 Muscle weakness (generalized): Secondary | ICD-10-CM | POA: Diagnosis not present

## 2022-06-26 DIAGNOSIS — R2681 Unsteadiness on feet: Secondary | ICD-10-CM | POA: Diagnosis not present

## 2022-06-27 DIAGNOSIS — M6281 Muscle weakness (generalized): Secondary | ICD-10-CM | POA: Diagnosis not present

## 2022-06-27 DIAGNOSIS — R2681 Unsteadiness on feet: Secondary | ICD-10-CM | POA: Diagnosis not present

## 2022-07-01 DIAGNOSIS — M6281 Muscle weakness (generalized): Secondary | ICD-10-CM | POA: Diagnosis not present

## 2022-07-01 DIAGNOSIS — R2681 Unsteadiness on feet: Secondary | ICD-10-CM | POA: Diagnosis not present

## 2022-07-02 DIAGNOSIS — M6281 Muscle weakness (generalized): Secondary | ICD-10-CM | POA: Diagnosis not present

## 2022-07-02 DIAGNOSIS — R2681 Unsteadiness on feet: Secondary | ICD-10-CM | POA: Diagnosis not present

## 2022-07-03 DIAGNOSIS — M6281 Muscle weakness (generalized): Secondary | ICD-10-CM | POA: Diagnosis not present

## 2022-07-03 DIAGNOSIS — R2681 Unsteadiness on feet: Secondary | ICD-10-CM | POA: Diagnosis not present

## 2022-07-04 DIAGNOSIS — R2681 Unsteadiness on feet: Secondary | ICD-10-CM | POA: Diagnosis not present

## 2022-07-04 DIAGNOSIS — M6281 Muscle weakness (generalized): Secondary | ICD-10-CM | POA: Diagnosis not present

## 2022-07-09 DIAGNOSIS — M6281 Muscle weakness (generalized): Secondary | ICD-10-CM | POA: Diagnosis not present

## 2022-07-09 DIAGNOSIS — R2681 Unsteadiness on feet: Secondary | ICD-10-CM | POA: Diagnosis not present

## 2022-07-10 DIAGNOSIS — R2681 Unsteadiness on feet: Secondary | ICD-10-CM | POA: Diagnosis not present

## 2022-07-10 DIAGNOSIS — M6281 Muscle weakness (generalized): Secondary | ICD-10-CM | POA: Diagnosis not present

## 2022-07-11 DIAGNOSIS — M6281 Muscle weakness (generalized): Secondary | ICD-10-CM | POA: Diagnosis not present

## 2022-07-11 DIAGNOSIS — R2681 Unsteadiness on feet: Secondary | ICD-10-CM | POA: Diagnosis not present

## 2022-07-12 ENCOUNTER — Encounter: Payer: Self-pay | Admitting: Orthopedic Surgery

## 2022-07-12 ENCOUNTER — Non-Acute Institutional Stay: Payer: Medicare HMO | Admitting: Orthopedic Surgery

## 2022-07-12 DIAGNOSIS — N3946 Mixed incontinence: Secondary | ICD-10-CM

## 2022-07-12 DIAGNOSIS — M159 Polyosteoarthritis, unspecified: Secondary | ICD-10-CM | POA: Diagnosis not present

## 2022-07-12 DIAGNOSIS — J449 Chronic obstructive pulmonary disease, unspecified: Secondary | ICD-10-CM | POA: Diagnosis not present

## 2022-07-12 DIAGNOSIS — R2681 Unsteadiness on feet: Secondary | ICD-10-CM | POA: Diagnosis not present

## 2022-07-12 DIAGNOSIS — F418 Other specified anxiety disorders: Secondary | ICD-10-CM

## 2022-07-12 DIAGNOSIS — Z8719 Personal history of other diseases of the digestive system: Secondary | ICD-10-CM

## 2022-07-12 DIAGNOSIS — D500 Iron deficiency anemia secondary to blood loss (chronic): Secondary | ICD-10-CM

## 2022-07-12 DIAGNOSIS — R69 Illness, unspecified: Secondary | ICD-10-CM | POA: Diagnosis not present

## 2022-07-12 DIAGNOSIS — M6281 Muscle weakness (generalized): Secondary | ICD-10-CM | POA: Diagnosis not present

## 2022-07-12 DIAGNOSIS — I10 Essential (primary) hypertension: Secondary | ICD-10-CM | POA: Diagnosis not present

## 2022-07-12 NOTE — Progress Notes (Signed)
Location:  [facility] Room Number: 20 Place of Service:  ALF [phone]) Provider: Yvonna Alanis, NP  Patient Care Team: Yvonna Alanis, NP as PCP - General (Adult Health Nurse Practitioner) Virgie Dad, MD as Consulting Physician (Internal Medicine)  Extended Emergency Contact Information Primary Emergency Contact: Molden,[name] Address: [address]          [place],  [address] [place] Phone: [phone] Mobile Phone: [phone] Relation: Spouse Secondary Emergency Contact: Oluwademilade, Kellett Mobile Phone: [phone] Relation: Son Interpreter needed? No  Code Status:  Full Code Goals of care: Advanced Directive information    07/12/2022   11:08 AM  Advanced Directives  Does Patient Have a Medical Advance Directive? No  Does patient want to make changes to medical advance directive? No - Patient declined     Chief Complaint  Patient presents with   Medical Management of Chronic Issues    Routine Visit    HPI:  Pt is a 82 y.o. female seen today for medical management of chronic diseases.    She currently resides on the assisted living unit at [facility]. PMH: HTN, COPD, pulmonary nodule, Diverticulosis with GI bleed, GERD, OA, osteoporosis,macular degeneration, iron def anemia, unstable gait, anxiety, and depression.    GI bleed- H/o diverticular bleed 11/2020 s/p embolization with recurrence 05/2021, hospitalized 05/2022 [place] bleeding scan showed active bleeding at splenic flexure, embolization was not performed, she received 3 units PRBC, denies rectal bleeding today, remains on Protonix Iron deficiency anemia- hgb 11.3 (12/14)> was 8.0 (11/07), iron 87/ TIBC 232 05/06/2022, remains on ferrous gluconate HTN- BUN/creat 5/0.43 05/06/2022, remains on metoprolol  COPD- no recent exacerbations, does not use oxygen, remains on albuterol and anoro ellipta OA- right knee most bothersome, remains on scheduled  tylenol Depression/anxiety- no mood changes, Na+ 140 05/06/2022, remains on zoloft Stress/urge incontinence- lives in assisted living Unstable gait- no recent falls, ambulates with walker  Recent weights:  01/4- 174.8 lbs  12/01- 171 lbs  10/07- 171.2 lbs  Recent blood pressures:  01/10- 112/60  01/03- 120/71  12/27- 119/64    Past Medical History:  Diagnosis Date   Anxiety    Arthritis    "bilateral knees, shoulders, elbows; neck, pretty widespread" (09/05/2017)   BPPV (benign paroxysmal positional vertigo)    Depression    GERD (gastroesophageal reflux disease)    Glaucoma, both eyes    Headache    "probably 2/month" (09/05/2017)   History of blood transfusion ~ 2008   "related to LGIB"   Hyperlipemia    Lower GI bleeding ~ 2008; 09/05/2017   "had to have blood transfusion"   Macular degeneration, bilateral    Osteopenia    Seasonal allergies    Skin cancer, basal cell 2001   "off my nose, left side"   Sleeping excessive    Tinnitus of both ears    Past Surgical History:  Procedure Laterality Date   BALLOON DILATION N/A 05/08/2021   Procedure: BALLOON DILATION;  Surgeon: Ronnette Juniper, MD;  Location: [place];  Service: Gastroenterology;  Laterality: N/A;   BASAL CELL CARCINOMA EXCISION  2001   "off my nose, left side"   BIOPSY  05/08/2021   Procedure: BIOPSY;  Surgeon: Ronnette Juniper, MD;  Location: [place];  Service: Gastroenterology;;   BLEPHAROPLASTY Bilateral    CATARACT EXTRACTION W/ INTRAOCULAR LENS  IMPLANT, BILATERAL Bilateral 1990's   COLONOSCOPY WITH PROPOFOL N/A 05/04/2022   Procedure: COLONOSCOPY WITH PROPOFOL;  Surgeon: Ronnette Juniper, MD;  Location: [facility];  Service: Gastroenterology;  Laterality: N/A;   ESOPHAGOGASTRODUODENOSCOPY (EGD) WITH PROPOFOL N/A 05/08/2021   Procedure: ESOPHAGOGASTRODUODENOSCOPY (EGD) WITH PROPOFOL;  Surgeon: Ronnette Juniper, MD;  Location: [place];  Service: Gastroenterology;  Laterality: N/A;    ESOPHAGOGASTRODUODENOSCOPY (EGD) WITH PROPOFOL N/A 01/11/2022   Procedure: ESOPHAGOGASTRODUODENOSCOPY (EGD) WITH PROPOFOL;  Surgeon: Clarene Essex, MD;  Location: [place];  Service: Gastroenterology;  Laterality: N/A;   EYE SURGERY Bilateral    "to improve vision after cataract OR"   IR ANGIOGRAM FOLLOW UP STUDY  12/19/2020   IR ANGIOGRAM SELECTIVE EACH ADDITIONAL VESSEL  12/19/2020   IR ANGIOGRAM SELECTIVE EACH ADDITIONAL VESSEL  12/19/2020   IR ANGIOGRAM VISCERAL SELECTIVE  12/19/2020   IR EMBO ART  VEN HEMORR LYMPH EXTRAV  INC GUIDE ROADMAPPING  12/19/2020   IR US GUIDE VASC ACCESS RIGHT  12/19/2020   JOINT REPLACEMENT     POLYPECTOMY  05/04/2022   Procedure: POLYPECTOMY;  Surgeon: Ronnette Juniper, MD;  Location: [facility];  Service: Gastroenterology;;   STAPEDES SURGERY Left    "scraped stapedes because it was sticking when it wasn't suppose to"   [place] Left ~ 2008   TOTAL KNEE ARTHROPLASTY  12/20/2011   Procedure: TOTAL KNEE ARTHROPLASTY;  Surgeon: Augustin Schooling, MD;  Location: [place];  Service: Orthopedics;  Laterality: Right;  Right Total Knee Arthroplasty   TUBAL LIGATION  1980's    Allergies  Allergen Reactions   Morphine And Related Itching   Abaloparatide Other (See Comments)    Unknown reaction   Aspirin Other (See Comments)    Unknown reaction   Duloxetine Hcl Other (See Comments)    Unknown reaction   Ventolin [Albuterol] Other (See Comments)    rapid heart beat   Cefadroxil Hives    Patient can take amoxicillin and cipro    Outpatient Encounter Medications as of 07/12/2022  Medication Sig   acetaminophen (TYLENOL) 500 MG tablet Take 1,000 mg by mouth 2 (two) times daily.   albuterol (VENTOLIN HFA) 108 (90 Base) MCG/ACT inhaler Inhale 2 puffs into the lungs every 4 (four) hours as needed for wheezing or shortness of breath.   alendronate (FOSAMAX) 70 MG tablet Take 70 mg by mouth every Sunday. Take with a full  glass of water on an empty stomach.   Ascorbic Acid (VITAMIN C) 1000 MG tablet Take 1,000 mg by mouth every morning.   Cholecalciferol (VITAMIN D3) 50 MCG (2000 UT) TABS Take 2,000 Units by mouth every morning.   docusate sodium (COLACE) 100 MG capsule Take 100 mg by mouth at bedtime.   dorzolamide-timolol (COSOPT) 22.3-6.8 MG/ML ophthalmic solution Place 1 drop into both eyes 2 (two) times daily.   ferrous gluconate (FERGON) 240 (27 FE) MG tablet Take 240 mg by mouth every morning. Take without food   latanoprost (XALATAN) 0.005 % ophthalmic solution Place 1 drop into both eyes at bedtime.   loratadine (CLARITIN) 10 MG tablet Take 1 tablet (10 mg total) by mouth daily.   metoprolol tartrate (LOPRESSOR) 25 MG tablet Take 0.5 tablets (12.5 mg total) by mouth 2 (two) times daily.   Multiple Vitamins-Minerals (PRESERVISION AREDS 2) CAPS Take 1 capsule by mouth 2 (two) times daily.   Netarsudil Dimesylate (RHOPRESSA) 0.02 % SOLN Place 1 drop into both eyes at bedtime.   pantoprazole (PROTONIX) 40 MG tablet Take 40 mg by mouth 2 (two) times daily.   polyethylene glycol (MIRALAX /  GLYCOLAX) 17 g packet Take 8.5 g by mouth daily as needed (constipation).   Probiotic Product (ALIGN) 4 MG CAPS Take 4 mg by mouth at bedtime.   rOPINIRole (REQUIP) 1 MG tablet Take 0.5 mg by mouth at bedtime as needed (restless legs/tremors).   sertraline (ZOLOFT) 100 MG tablet Take 1 tablet (100 mg total) by mouth daily.   simvastatin (ZOCOR) 20 MG tablet Take 20 mg by mouth daily.   Sodium Fluoride (PREVIDENT 5000 BOOSTER PLUS) 1.1 % PSTE Place 1 Application onto teeth See admin instructions. Brush on teeth with a toothbrush after evening mouth care. Spit out excess and do not rinse.   umeclidinium-vilanterol (ANORO ELLIPTA) 62.5-25 MCG/ACT AEPB Inhale 1 puff into the lungs every morning.   zinc oxide 20 % ointment Apply 1 Application topically See admin instructions. Apply topically to buttocks after every incontinent  episode and as needed for redness   [DISCONTINUED] MYRBETRIQ 25 MG TB24 tablet Take 25 mg by mouth daily.   No facility-administered encounter medications on file as of 07/12/2022.    Review of Systems  Constitutional:  Positive for fatigue. Negative for activity change, appetite change and fever.  HENT:  Negative for congestion and trouble swallowing.   Eyes:  Negative for visual disturbance.  Respiratory:  Negative for cough, shortness of breath and wheezing.   Cardiovascular:  Negative for chest pain and leg swelling.  Gastrointestinal:  Negative for abdominal distention, abdominal pain, blood in stool, constipation, diarrhea and vomiting.  Genitourinary:  Positive for frequency and urgency. Negative for dysuria and vaginal bleeding.  Musculoskeletal:  Positive for arthralgias and gait problem.  Skin:  Negative for wound.  Neurological:  Positive for weakness. Negative for dizziness and headaches.  Psychiatric/Behavioral:  Positive for dysphoric mood. Negative for confusion and sleep disturbance. The patient is nervous/anxious.     Immunization History  Administered Date(s) Administered   Fluad Quad(high Dose 65+) 04/24/2022   Influenza, High Dose Seasonal PF 03/14/2018   PFIZER(Purple Top)SARS-COV-2 Vaccination 07/25/2019, 08/16/2019, 12/12/2021   Pneumococcal-Unspecified 03/02/2011   Tdap 03/12/2018   Zoster Recombinat (Shingrix) 10/08/2017, 12/09/2017   Pertinent  Health Maintenance Due  Topic Date Due   DEXA SCAN  Never done   INFLUENZA VACCINE  Completed      05/06/2022    9:30 AM 05/06/2022    9:11 PM 05/07/2022   12:25 PM 05/17/2022   10:19 AM 07/12/2022   11:08 AM  Fall Risk  Falls in the past year?    1 0  Was there an injury with Fall?    1 0  Fall Risk Category Calculator    2 0  Fall Risk Category    Moderate Low  Patient Fall Risk Level Moderate fall risk Moderate fall risk Moderate fall risk Moderate fall risk Low fall risk  Patient at Risk for Falls Due to     History of fall(s) History of fall(s)  Fall risk Follow up    Falls evaluation completed Falls evaluation completed   Functional Status Survey:    Vitals:   07/12/22 1101  BP: 112/60  Pulse: 80  Resp: 20  Temp: 98.2 F (36.8 C)  SpO2: 95%  Weight: 177 lb 8 oz (80.5 kg)  Height: '5\' 1"'$  (1.549 m)   Body mass index is 33.54 kg/m. Physical Exam Vitals reviewed.  Constitutional:      General: She is not in acute distress. HENT:     Head: Normocephalic.     Right Ear: There is no  impacted cerumen.     Left Ear: There is no impacted cerumen.     Nose: Nose normal.     Mouth/Throat:     Mouth: Mucous membranes are moist.  Eyes:     General:        Right eye: No discharge.        Left eye: No discharge.  Cardiovascular:     Rate and Rhythm: Normal rate and regular rhythm.     Pulses: Normal pulses.     Heart sounds: Normal heart sounds.  Pulmonary:     Effort: Pulmonary effort is normal.     Breath sounds: Normal breath sounds.  Abdominal:     General: Bowel sounds are normal. There is no distension.     Palpations: Abdomen is soft.     Tenderness: There is no abdominal tenderness.  Musculoskeletal:     Cervical back: Neck supple.     Right lower leg: Edema present.     Left lower leg: Edema present.     Comments: Non pitting  Skin:    General: Skin is warm and dry.     Capillary Refill: Capillary refill takes less than 2 seconds.  Neurological:     General: No focal deficit present.     Mental Status: She is alert and oriented to person, place, and time.     Motor: Weakness present.     Gait: Gait abnormal.     Comments: walker  Psychiatric:        Mood and Affect: Mood normal.        Behavior: Behavior normal.     Labs reviewed: Recent Labs    01/13/22 0035 01/14/22 0309 05/02/22 1839 05/04/22 0752 05/05/22 0008 05/05/22 0631 05/06/22 0655  NA 141 141   < > 140  --  137 140  K 3.7 3.8   < > 3.2*  --  3.4* 4.0  CL 110 110   < > 113*  --  111 110   CO2 23 26   < > 22  --  24 27  GLUCOSE 123* 125*   < > 108*  --  119* 114*  BUN 10 11   < > 20  --  9 5*  CREATININE 0.59 0.58   < > 0.34*  --  0.46 0.43*  CALCIUM 8.5* 8.5*   < > 7.3*  --  7.5* 7.8*  MG 1.9 2.1  --   --  1.9  --   --    < > = values in this interval not displayed.   Recent Labs    01/08/22 0500 01/11/22 2146 05/02/22 1839  AST '16 23 18  '$ ALT '13 15 14  '$ ALKPHOS 36* 32* 46  BILITOT 0.4 0.4 0.4  PROT 5.0* 4.5* 6.1*  ALBUMIN 3.0* 2.6* 3.5   Recent Labs    05/04/22 1532 05/05/22 0008 05/05/22 0631 05/05/22 1056 05/06/22 0655 05/06/22 0656 05/06/22 1018 05/07/22 0954 06/13/22 0700  WBC 6.7  --  6.9  --  5.4  --   --   --  4.3  NEUTROABS 3.9  --  4.5  --  2.9  --   --   --  2,103.00  HGB 7.3*   < > 7.9*   < > 7.4*   < > 7.7* 8.0* 11.3*  HCT 22.0*   < > 24.8*   < > 23.2*   < > 24.5* 24.9* 35*  MCV 94.8  --  94.7  --  95.1  --   --   --   --   PLT 114*  --  107*  --  111*  --   --   --  160   < > = values in this interval not displayed.   Lab Results  Component Value Date   TSH 1.88 11/18/2017   No results found for: "HGBA1C" Lab Results  Component Value Date   CHOL 147 02/18/2022   HDL 51 02/18/2022   LDLCALC 72 02/18/2022   TRIG 162 (A) 02/18/2022    Significant Diagnostic Results in last 30 days:  No results found.  Assessment/Plan 1. History of GI diverticular bleed -11/2020 s/p embolization with recurrence 05/2021 - 05/2022 NM bleeding scan showed active bleeding at splenic flexure, embolization was not performed - denies rectal bleeding today - hgb 11.3 (12/14) - cont Protonix  2. Iron deficiency anemia due to chronic blood loss - hgb improved - cont ferrous gluconate  3. Primary hypertension - controlled with metoprolol  4. COPD GOLD II  - no recent exacerbations - does not use oxygen - cont albuterol and anoro ellipta  5. Primary osteoarthritis involving multiple joints - right knee most bothersome - cont scheduled  tylenol  6. Depression with anxiety - no mood changes - Na+ stable - cont Zoloft  7. Mixed stress and urge urinary incontinence - ongoing - limits caffeine intake  8. Unstable gait - no recent falls - ambulates with walker    Family/ staff Communication: plan discussed with patient and nurse  Labs/tests ordered:  none

## 2022-07-14 DIAGNOSIS — R2681 Unsteadiness on feet: Secondary | ICD-10-CM | POA: Diagnosis not present

## 2022-07-14 DIAGNOSIS — M6281 Muscle weakness (generalized): Secondary | ICD-10-CM | POA: Diagnosis not present

## 2022-07-16 DIAGNOSIS — R2681 Unsteadiness on feet: Secondary | ICD-10-CM | POA: Diagnosis not present

## 2022-07-16 DIAGNOSIS — M6281 Muscle weakness (generalized): Secondary | ICD-10-CM | POA: Diagnosis not present

## 2022-07-17 DIAGNOSIS — R2681 Unsteadiness on feet: Secondary | ICD-10-CM | POA: Diagnosis not present

## 2022-07-17 DIAGNOSIS — M6281 Muscle weakness (generalized): Secondary | ICD-10-CM | POA: Diagnosis not present

## 2022-07-22 DIAGNOSIS — M6281 Muscle weakness (generalized): Secondary | ICD-10-CM | POA: Diagnosis not present

## 2022-07-22 DIAGNOSIS — R2681 Unsteadiness on feet: Secondary | ICD-10-CM | POA: Diagnosis not present

## 2022-07-29 DIAGNOSIS — R2681 Unsteadiness on feet: Secondary | ICD-10-CM | POA: Diagnosis not present

## 2022-07-29 DIAGNOSIS — M6281 Muscle weakness (generalized): Secondary | ICD-10-CM | POA: Diagnosis not present

## 2022-07-30 DIAGNOSIS — M6281 Muscle weakness (generalized): Secondary | ICD-10-CM | POA: Diagnosis not present

## 2022-07-30 DIAGNOSIS — R2681 Unsteadiness on feet: Secondary | ICD-10-CM | POA: Diagnosis not present

## 2022-08-01 DIAGNOSIS — R2681 Unsteadiness on feet: Secondary | ICD-10-CM | POA: Diagnosis not present

## 2022-08-01 DIAGNOSIS — M6281 Muscle weakness (generalized): Secondary | ICD-10-CM | POA: Diagnosis not present

## 2022-08-02 DIAGNOSIS — M6281 Muscle weakness (generalized): Secondary | ICD-10-CM | POA: Diagnosis not present

## 2022-08-02 DIAGNOSIS — R2681 Unsteadiness on feet: Secondary | ICD-10-CM | POA: Diagnosis not present

## 2022-08-10 DIAGNOSIS — R2681 Unsteadiness on feet: Secondary | ICD-10-CM | POA: Diagnosis not present

## 2022-08-10 DIAGNOSIS — M6281 Muscle weakness (generalized): Secondary | ICD-10-CM | POA: Diagnosis not present

## 2022-08-12 DIAGNOSIS — M6281 Muscle weakness (generalized): Secondary | ICD-10-CM | POA: Diagnosis not present

## 2022-08-12 DIAGNOSIS — R2681 Unsteadiness on feet: Secondary | ICD-10-CM | POA: Diagnosis not present

## 2022-08-15 DIAGNOSIS — M6281 Muscle weakness (generalized): Secondary | ICD-10-CM | POA: Diagnosis not present

## 2022-08-15 DIAGNOSIS — R2681 Unsteadiness on feet: Secondary | ICD-10-CM | POA: Diagnosis not present

## 2022-08-28 DIAGNOSIS — H353131 Nonexudative age-related macular degeneration, bilateral, early dry stage: Secondary | ICD-10-CM | POA: Diagnosis not present

## 2022-08-28 DIAGNOSIS — H401132 Primary open-angle glaucoma, bilateral, moderate stage: Secondary | ICD-10-CM | POA: Diagnosis not present

## 2022-08-28 DIAGNOSIS — H524 Presbyopia: Secondary | ICD-10-CM | POA: Diagnosis not present

## 2022-10-03 ENCOUNTER — Non-Acute Institutional Stay: Payer: Medicare HMO | Admitting: Internal Medicine

## 2022-10-03 ENCOUNTER — Encounter: Payer: Self-pay | Admitting: Internal Medicine

## 2022-10-03 DIAGNOSIS — M159 Polyosteoarthritis, unspecified: Secondary | ICD-10-CM | POA: Diagnosis not present

## 2022-10-03 DIAGNOSIS — G479 Sleep disorder, unspecified: Secondary | ICD-10-CM | POA: Diagnosis not present

## 2022-10-03 DIAGNOSIS — J449 Chronic obstructive pulmonary disease, unspecified: Secondary | ICD-10-CM

## 2022-10-03 DIAGNOSIS — N3946 Mixed incontinence: Secondary | ICD-10-CM

## 2022-10-03 DIAGNOSIS — D500 Iron deficiency anemia secondary to blood loss (chronic): Secondary | ICD-10-CM

## 2022-10-03 DIAGNOSIS — R69 Illness, unspecified: Secondary | ICD-10-CM | POA: Diagnosis not present

## 2022-10-03 DIAGNOSIS — I10 Essential (primary) hypertension: Secondary | ICD-10-CM | POA: Diagnosis not present

## 2022-10-03 DIAGNOSIS — Z8719 Personal history of other diseases of the digestive system: Secondary | ICD-10-CM | POA: Diagnosis not present

## 2022-10-03 DIAGNOSIS — F418 Other specified anxiety disorders: Secondary | ICD-10-CM

## 2022-10-03 NOTE — Progress Notes (Signed)
Location:  [facility] Room Number: 20A Place of Service:  ALF [phone]) Provider:  Vance Gather, NP  Patient Care Team: Octavia Heir, NP as PCP - General (Adult Health Nurse Practitioner) [name], MD as Consulting Physician (Internal Medicine)  Extended Emergency Contact Information Primary Emergency Contact: Kemnitz,[name] Address: [address]          [place], [place] [address] [name] of [place] Home Phone: [phone] Mobile Phone: [phone] Relation: Spouse Secondary Emergency Contact: Reyna, Lorenzi Mobile Phone: [phone] Relation: Son Interpreter needed? No  Code Status:  Full Code Goals of care: Advanced Directive information    10/03/2022    2:12 PM  Advanced Directives  Does Patient Have a Medical Advance Directive? No     Chief Complaint  Patient presents with   Medical Management of Chronic Issues    Patient is being seen for a routine visit    Quality Metric Gaps    Discussed the need for Bone Density    HPI:  Pt is a 82 y.o. female seen today for medical management of chronic diseases.    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  Follows with [facility] Recurrent falls Osteoporosis  Urge incontinence  She continues to be stable Has not had any acute GI bleed recently .  Her only complaint today was that she feels tired and sleepy all the time .  Patient has had sleep study done by Dr. Waynard Edwards and was told told to wear oxygen at night. Walks with her walker no falls continues to have urinary incontinence Has gained some weight. Wt Readings from Last 3 Encounters:  10/03/22 178 lb 14.4 oz (81.1 kg)  07/12/22 177 lb 8 oz (80.5 kg)  06/21/22 171 lb (77.6 kg)     Past Medical History:  Diagnosis Date    Anxiety    Arthritis    "bilateral knees, shoulders, elbows; neck, pretty widespread" (09/05/2017)   BPPV (benign paroxysmal positional vertigo)    Depression    GERD (gastroesophageal reflux disease)    Glaucoma, both eyes    Headache    "probably 2/month" (09/05/2017)   History of blood transfusion ~ 2008   "related to LGIB"   Hyperlipemia    Lower GI bleeding ~ 2008; 09/05/2017   "had to have blood transfusion"   Macular degeneration, bilateral    Osteopenia    Seasonal allergies    Skin cancer, basal cell 2001   "off my nose, left side"   Sleeping excessive    Tinnitus of both ears    Past Surgical History:  Procedure Laterality Date   BALLOON DILATION N/A 05/08/2021   Procedure: BALLOON DILATION;  Surgeon: Kerin Salen, MD;  Location: [facility] ENDOSCOPY;  Service: Gastroenterology;  Laterality: N/A;   BASAL CELL CARCINOMA EXCISION  2001   "off my nose, left side"   BIOPSY  05/08/2021   Procedure: BIOPSY;  Surgeon: Kerin Salen, MD;  Location: [facility] ENDOSCOPY;  Service: Gastroenterology;;   BLEPHAROPLASTY Bilateral    CATARACT EXTRACTION W/ INTRAOCULAR LENS  IMPLANT, BILATERAL Bilateral 1990's   COLONOSCOPY WITH PROPOFOL N/A 05/04/2022   Procedure: COLONOSCOPY WITH PROPOFOL;  Surgeon: Kerin Salen, MD;  Location: [facility] ENDOSCOPY;  Service: Gastroenterology;  Laterality: N/A;   ESOPHAGOGASTRODUODENOSCOPY (EGD) WITH PROPOFOL  N/A 05/08/2021   Procedure: ESOPHAGOGASTRODUODENOSCOPY (EGD) WITH PROPOFOL;  Surgeon: Kerin Salen, MD;  Location: [facility] ENDOSCOPY;  Service: Gastroenterology;  Laterality: N/A;   ESOPHAGOGASTRODUODENOSCOPY (EGD) WITH PROPOFOL N/A 01/11/2022   Procedure: ESOPHAGOGASTRODUODENOSCOPY (EGD) WITH PROPOFOL;  Surgeon: Vida Rigger, MD;  Location: [facility] ENDOSCOPY;  Service: Gastroenterology;  Laterality: N/A;   EYE SURGERY Bilateral    "to improve vision after cataract OR"   IR ANGIOGRAM FOLLOW UP STUDY  12/19/2020   IR ANGIOGRAM SELECTIVE EACH ADDITIONAL VESSEL  12/19/2020   IR ANGIOGRAM  SELECTIVE EACH ADDITIONAL VESSEL  12/19/2020   IR ANGIOGRAM VISCERAL SELECTIVE  12/19/2020   IR EMBO ART  VEN HEMORR LYMPH EXTRAV  INC GUIDE ROADMAPPING  12/19/2020   IR US GUIDE VASC ACCESS RIGHT  12/19/2020   JOINT REPLACEMENT     POLYPECTOMY  05/04/2022   Procedure: POLYPECTOMY;  Surgeon: Kerin Salen, MD;  Location: [facility] ENDOSCOPY;  Service: Gastroenterology;;   STAPEDES SURGERY Left    "scraped stapedes because it was sticking when it wasn't suppose to"   TONSILLECTOMY AND ADENOIDECTOMY  1946   TOTAL KNEE ARTHROPLASTY Left ~ 2008   TOTAL KNEE ARTHROPLASTY  12/20/2011   Procedure: TOTAL KNEE ARTHROPLASTY;  Surgeon: Verlee Rossetti, MD;  Location: [facility] OR;  Service: Orthopedics;  Laterality: Right;  Right Total Knee Arthroplasty   TUBAL LIGATION  1980's    Allergies  Allergen Reactions   Morphine And Related Itching   Abaloparatide Other (See Comments)    Unknown reaction   Aspirin Other (See Comments)    Unknown reaction   Duloxetine Hcl Other (See Comments)    Unknown reaction   Ventolin [Albuterol] Other (See Comments)    rapid heart beat   Cefadroxil Hives    Patient can take amoxicillin and cipro    Outpatient Encounter Medications as of 10/03/2022  Medication Sig   albuterol (VENTOLIN HFA) 108 (90 Base) MCG/ACT inhaler Inhale 2 puffs into the lungs every 4 (four) hours as needed for wheezing or shortness of breath.   alendronate (FOSAMAX) 70 MG tablet Take 70 mg by mouth every Sunday. Take with a full glass of water on an empty stomach.   Ascorbic Acid (VITAMIN C) 1000 MG tablet Take 1,000 mg by mouth every morning.   Cholecalciferol (VITAMIN D3) 50 MCG (2000 UT) TABS Take 2,000 Units by mouth every morning.   docusate sodium (COLACE) 100 MG capsule Take 100 mg by mouth at bedtime.   dorzolamide-timolol (COSOPT) 22.3-6.8 MG/ML ophthalmic solution Place 1 drop into both eyes 2 (two) times daily.   ferrous gluconate (FERGON) 240 (27 FE) MG tablet Take 240 mg by mouth every morning.  Take without food   latanoprost (XALATAN) 0.005 % ophthalmic solution Place 1 drop into both eyes at bedtime.   loratadine (CLARITIN) 10 MG tablet Take 1 tablet (10 mg total) by mouth daily.   metoprolol tartrate (LOPRESSOR) 25 MG tablet Take 0.5 tablets (12.5 mg total) by mouth 2 (two) times daily.   Multiple Vitamins-Minerals (PRESERVISION AREDS 2) CAPS Take 1 capsule by mouth 2 (two) times daily.   Netarsudil Dimesylate (RHOPRESSA) 0.02 % SOLN Place 1 drop into both eyes at bedtime.   pantoprazole (PROTONIX) 40 MG tablet Take 40 mg by mouth 2 (two) times daily.   polyethylene glycol (MIRALAX / GLYCOLAX) 17 g packet Take 8.5 g by mouth daily as needed (constipation).   Probiotic Product (ALIGN) 4 MG CAPS Take 4 mg by mouth at bedtime.   rOPINIRole (REQUIP) 1 MG tablet  Take 0.5 mg by mouth at bedtime as needed (restless legs/tremors).   sertraline (ZOLOFT) 100 MG tablet Take 1 tablet (100 mg total) by mouth daily.   simvastatin (ZOCOR) 20 MG tablet Take 20 mg by mouth daily.   Sodium Fluoride (PREVIDENT 5000 BOOSTER PLUS) 1.1 % PSTE Place 1 Application onto teeth See admin instructions. Brush on teeth with a toothbrush after evening mouth care. Spit out excess and do not rinse.   umeclidinium-vilanterol (ANORO ELLIPTA) 62.5-25 MCG/ACT AEPB Inhale 1 puff into the lungs every morning.   zinc oxide 20 % ointment Apply 1 Application topically See admin instructions. Apply topically to buttocks after every incontinent episode and as needed for redness   acetaminophen (TYLENOL) 500 MG tablet Take 1,000 mg by mouth 2 (two) times daily.   No facility-administered encounter medications on file as of 10/03/2022.    Review of Systems  Constitutional:  Negative for activity change and appetite change.  HENT: Negative.    Respiratory:  Negative for cough and shortness of breath.   Cardiovascular:  Negative for leg swelling.  Gastrointestinal:  Negative for constipation.  Genitourinary: Negative.    Musculoskeletal:  Negative for arthralgias, gait problem and myalgias.  Skin: Negative.   Neurological:  Negative for dizziness and weakness.  Psychiatric/Behavioral:  Positive for sleep disturbance. Negative for confusion and dysphoric mood.     Immunization History  Administered Date(s) Administered   Fluad Quad(high Dose 65+) 04/24/2022   Influenza, High Dose Seasonal PF 03/14/2018   PFIZER(Purple Top)SARS-COV-2 Vaccination 07/25/2019, 08/16/2019, 12/12/2021   Pneumococcal-Unspecified 03/02/2011   Tdap 03/12/2018   Zoster Recombinat (Shingrix) 10/08/2017, 12/09/2017   Pertinent  Health Maintenance Due  Topic Date Due   DEXA SCAN  Never done   INFLUENZA VACCINE  01/30/2023      05/06/2022    9:30 AM 05/06/2022    9:11 PM 05/07/2022   12:25 PM 05/17/2022   10:19 AM 07/12/2022   11:08 AM  Fall Risk  Falls in the past year?    1 0  Was there an injury with Fall?    1 0  Fall Risk Category Calculator    2 0  Fall Risk Category (Retired)    Moderate Low  (RETIRED) Patient Fall Risk Level Moderate fall risk Moderate fall risk Moderate fall risk Moderate fall risk Low fall risk  Patient at Risk for Falls Due to    History of fall(s) History of fall(s)  Fall risk Follow up    Falls evaluation completed Falls evaluation completed   Functional Status Survey:    Vitals:   10/03/22 1409  BP: 117/60  Pulse: 64  Resp: 18  Temp: 98.2 F (36.8 C)  TempSrc: Temporal  SpO2: 97%  Weight: 178 lb 14.4 oz (81.1 kg)  Height: 5\' 1"  (1.549 m)   Body mass index is 33.8 kg/m. Physical Exam Vitals reviewed.  Constitutional:      Appearance: Normal appearance.  HENT:     Head: Normocephalic.     Nose: Nose normal.     Mouth/Throat:     Mouth: Mucous membranes are moist.     Pharynx: Oropharynx is clear.  Eyes:     Pupils: Pupils are equal, round, and reactive to light.  Cardiovascular:     Rate and Rhythm: Normal rate and regular rhythm.     Pulses: Normal pulses.     Heart  sounds: Normal heart sounds. No murmur heard. Pulmonary:     Effort: Pulmonary effort is normal.  Breath sounds: Normal breath sounds.  Abdominal:     General: Abdomen is flat. Bowel sounds are normal.     Palpations: Abdomen is soft.  Musculoskeletal:        General: Swelling present.     Cervical back: Neck supple.  Skin:    General: Skin is warm.  Neurological:     General: No focal deficit present.     Mental Status: She is alert and oriented to person, place, and time.  Psychiatric:        Mood and Affect: Mood normal.        Thought Content: Thought content normal.     Labs reviewed: Recent Labs    01/13/22 0035 01/14/22 0309 05/02/22 1839 05/04/22 0752 05/05/22 0008 05/05/22 0631 05/06/22 0655  NA 141 141   < > 140  --  137 140  K 3.7 3.8   < > 3.2*  --  3.4* 4.0  CL 110 110   < > 113*  --  111 110  CO2 23 26   < > 22  --  24 27  GLUCOSE 123* 125*   < > 108*  --  119* 114*  BUN 10 11   < > 20  --  9 5*  CREATININE 0.59 0.58   < > 0.34*  --  0.46 0.43*  CALCIUM 8.5* 8.5*   < > 7.3*  --  7.5* 7.8*  MG 1.9 2.1  --   --  1.9  --   --    < > = values in this interval not displayed.   Recent Labs    01/08/22 0500 01/11/22 2146 05/02/22 1839  AST 16 23 18   ALT 13 15 14   ALKPHOS 36* 32* 46  BILITOT 0.4 0.4 0.4  PROT 5.0* 4.5* 6.1*  ALBUMIN 3.0* 2.6* 3.5   Recent Labs    05/04/22 1532 05/05/22 0008 05/05/22 0631 05/05/22 1056 05/06/22 0655 05/06/22 0656 05/06/22 1018 05/07/22 0954 06/13/22 0700  WBC 6.7  --  6.9  --  5.4  --   --   --  4.3  NEUTROABS 3.9  --  4.5  --  2.9  --   --   --  2,103.00  HGB 7.3*   < > 7.9*   < > 7.4*   < > 7.7* 8.0* 11.3*  HCT 22.0*   < > 24.8*   < > 23.2*   < > 24.5* 24.9* 35*  MCV 94.8  --  94.7  --  95.1  --   --   --   --   PLT 114*  --  107*  --  111*  --   --   --  160   < > = values in this interval not displayed.   Lab Results  Component Value Date   TSH 1.88 11/18/2017   No results found for:  "HGBA1C" Lab Results  Component Value Date   CHOL 147 02/18/2022   HDL 51 02/18/2022   LDLCALC 72 02/18/2022   TRIG 162 (A) 02/18/2022    Significant Diagnostic Results in last 30 days:  No results found.  Assessment/Plan 1. History of GI diverticular bleed Repeat CBC  2. Iron deficiency anemia due to chronic blood loss On iron   3. Primary hypertension Low dose  of Lopressor More for her tacycardia  4. COPD GOLD II  Oxygen at night Anoro 5. Primary osteoarthritis involving multiple joints Tylenol  6. Depression with anxiety Will  change her Zoloft to 50 mg to see if she feels less sleepy  7. Mixed stress and urge urinary incontinence On Ditropan   8. Sleep disturbance Has had Sleep study in past Will try to get the results from Dr Darcus Austin Office  9Senile osteoporosis Fosamax ? Started in 12/22 Will check with [facility] 10 GERD On Protonix 1 Family/ staff Communication:   Labs/tests ordered:  CBC,CMP,TSH,Lipid

## 2022-10-07 DIAGNOSIS — D649 Anemia, unspecified: Secondary | ICD-10-CM | POA: Diagnosis not present

## 2022-10-07 DIAGNOSIS — E785 Hyperlipidemia, unspecified: Secondary | ICD-10-CM | POA: Diagnosis not present

## 2022-10-07 DIAGNOSIS — E039 Hypothyroidism, unspecified: Secondary | ICD-10-CM | POA: Diagnosis not present

## 2022-10-08 LAB — COMPREHENSIVE METABOLIC PANEL
Albumin: 4.3 (ref 3.5–5.0)
Calcium: 9.2 (ref 8.7–10.7)
Globulin: 2.5

## 2022-10-08 LAB — HEPATIC FUNCTION PANEL
ALT: 10 U/L (ref 7–35)
AST: 15 (ref 13–35)
Alkaline Phosphatase: 50 (ref 25–125)
Bilirubin, Total: 0.5

## 2022-10-08 LAB — BASIC METABOLIC PANEL
BUN: 11 (ref 4–21)
CO2: 29 — AB (ref 13–22)
Chloride: 106 (ref 99–108)
Creatinine: 0.5 (ref 0.5–1.1)
Glucose: 95
Potassium: 4.5 mEq/L (ref 3.5–5.1)
Sodium: 140 (ref 137–147)

## 2022-10-08 LAB — CBC AND DIFFERENTIAL
HCT: 37 (ref 36–46)
Hemoglobin: 12.3 (ref 12.0–16.0)
Neutrophils Absolute: 4175
Platelets: 165 10*3/uL (ref 150–400)
WBC: 6.9

## 2022-10-08 LAB — CBC: RBC: 3.86 — AB (ref 3.87–5.11)

## 2022-10-08 LAB — TSH: TSH: 2.47 (ref 0.41–5.90)

## 2022-10-21 ENCOUNTER — Non-Acute Institutional Stay: Payer: Medicare HMO | Admitting: Orthopedic Surgery

## 2022-10-21 ENCOUNTER — Encounter: Payer: Self-pay | Admitting: Orthopedic Surgery

## 2022-10-21 DIAGNOSIS — J302 Other seasonal allergic rhinitis: Secondary | ICD-10-CM

## 2022-10-21 DIAGNOSIS — R062 Wheezing: Secondary | ICD-10-CM | POA: Diagnosis not present

## 2022-10-21 MED ORDER — CETIRIZINE HCL 10 MG PO TABS: 10.0000 mg | ORAL_TABLET | Freq: Every day | ORAL | 11 refills | Status: DC

## 2022-10-21 NOTE — Progress Notes (Signed)
Location:   [facility] Room Number: 20-A Place of Service:  [phone]) Provider:  Hazle Nordmann, NP    Patient Care Team: Octavia Heir, NP as PCP - General (Adult Health Nurse Practitioner) [name], MD as Consulting Physician (Internal Medicine)  Extended Emergency Contact Information Primary Emergency Contact: Vasallo,[name] Address: [address]          [place], [place] [address] [name] of [place] Home Phone: [phone] Mobile Phone: [phone] Relation: Spouse Secondary Emergency Contact: Julieth, Tugman Mobile Phone: [phone] Relation: Son Interpreter needed? No  Code Status:  FULL CODE Goals of care: Advanced Directive information    10/21/2022   10:10 AM  Advanced Directives  Does Patient Have a Medical Advance Directive? No  Would patient like information on creating a medical advance directive? No - Patient declined     Chief Complaint  Patient presents with   Acute Visit    Wheezing.    HPI:  Pt is a 82 y.o. female seen today for an acute visit due to increased wheezing.   She currently resides on the assisted living unit at [facility]. PMH: HTN, COPD, pulmonary nodule, Diverticulosis with GI bleed, GERD, OA, osteoporosis,macular degeneration, iron def anemia, unstable gait, anxiety, and depression.   She c/o runny nose (clear nasal congestion) and intermittent wheezing x 2 days. Covid test negative. H/o allergies. She is on Claritin. Denies fever, sore throat, cough, runny nose and body aches. Afebrile. Vitals stable.      Past Medical History:  Diagnosis Date   Anxiety    Arthritis    "bilateral knees, shoulders, elbows; neck, pretty widespread" (09/05/2017)   BPPV (benign paroxysmal positional vertigo)    Depression    GERD (gastroesophageal reflux disease)    Glaucoma, both eyes    Headache    "probably 2/month" (09/05/2017)   History of blood transfusion ~ 2008   "related to LGIB"   Hyperlipemia     Lower GI bleeding ~ 2008; 09/05/2017   "had to have blood transfusion"   Macular degeneration, bilateral    Osteopenia    Seasonal allergies    Skin cancer, basal cell 2001   "off my nose, left side"   Sleeping excessive    Tinnitus of both ears    Past Surgical History:  Procedure Laterality Date   BALLOON DILATION N/A 05/08/2021   Procedure: BALLOON DILATION;  Surgeon: Kerin Salen, MD;  Location: [facility] ENDOSCOPY;  Service: Gastroenterology;  Laterality: N/A;   BASAL CELL CARCINOMA EXCISION  2001   "off my nose, left side"   BIOPSY  05/08/2021   Procedure: BIOPSY;  Surgeon: Kerin Salen, MD;  Location: [facility] ENDOSCOPY;  Service: Gastroenterology;;   BLEPHAROPLASTY Bilateral    CATARACT EXTRACTION W/ INTRAOCULAR LENS  IMPLANT, BILATERAL Bilateral 1990's   COLONOSCOPY WITH PROPOFOL N/A 05/04/2022   Procedure: COLONOSCOPY WITH PROPOFOL;  Surgeon: Kerin Salen, MD;  Location: [facility] ENDOSCOPY;  Service: Gastroenterology;  Laterality: N/A;   ESOPHAGOGASTRODUODENOSCOPY (EGD) WITH PROPOFOL N/A 05/08/2021   Procedure: ESOPHAGOGASTRODUODENOSCOPY (EGD) WITH PROPOFOL;  Surgeon: Kerin Salen, MD;  Location: [facility] ENDOSCOPY;  Service: Gastroenterology;  Laterality: N/A;   ESOPHAGOGASTRODUODENOSCOPY (EGD) WITH PROPOFOL N/A 01/11/2022   Procedure: ESOPHAGOGASTRODUODENOSCOPY (EGD) WITH PROPOFOL;  Surgeon: Vida Rigger, MD;  Location: [facility] ENDOSCOPY;  Service: Gastroenterology;  Laterality: N/A;   EYE SURGERY Bilateral    "to improve vision after cataract OR"   IR ANGIOGRAM FOLLOW UP STUDY  12/19/2020   IR ANGIOGRAM SELECTIVE EACH  ADDITIONAL VESSEL  12/19/2020   IR ANGIOGRAM SELECTIVE EACH ADDITIONAL VESSEL  12/19/2020   IR ANGIOGRAM VISCERAL SELECTIVE  12/19/2020   IR EMBO ART  VEN HEMORR LYMPH EXTRAV  INC GUIDE ROADMAPPING  12/19/2020   IR US GUIDE VASC ACCESS RIGHT  12/19/2020   JOINT REPLACEMENT     POLYPECTOMY  05/04/2022   Procedure: POLYPECTOMY;  Surgeon: Kerin Salen, MD;  Location: [facility] ENDOSCOPY;  Service:  Gastroenterology;;   STAPEDES SURGERY Left    "scraped stapedes because it was sticking when it wasn't suppose to"   TONSILLECTOMY AND ADENOIDECTOMY  1946   TOTAL KNEE ARTHROPLASTY Left ~ 2008   TOTAL KNEE ARTHROPLASTY  12/20/2011   Procedure: TOTAL KNEE ARTHROPLASTY;  Surgeon: Verlee Rossetti, MD;  Location: [facility] OR;  Service: Orthopedics;  Laterality: Right;  Right Total Knee Arthroplasty   TUBAL LIGATION  1980's    Allergies  Allergen Reactions   Morphine And Related Itching   Aspirin Other (See Comments)    Unknown reaction   Duloxetine Hcl Other (See Comments)    Unknown reaction   Tymlos [Abaloparatide] Other (See Comments)    Unknown reaction   Ventolin [Albuterol] Other (See Comments)    rapid heart beat   Cefadroxil Hives    Patient can take amoxicillin and cipro    Allergies as of 10/21/2022       Reactions   Morphine And Related Itching   Aspirin Other (See Comments)   Unknown reaction   Duloxetine Hcl Other (See Comments)   Unknown reaction   Tymlos [abaloparatide] Other (See Comments)   Unknown reaction   Ventolin [albuterol] Other (See Comments)   rapid heart beat   Cefadroxil Hives   Patient can take amoxicillin and cipro        Medication List        Accurate as of October 21, 2022 10:11 AM. If you have any questions, ask your nurse or doctor.          acetaminophen 650 MG CR tablet Commonly known as: TYLENOL Take 650 mg by mouth in the morning and at bedtime. What changed: Another medication with the same name was removed. Continue taking this medication, and follow the directions you see here. Changed by: Octavia Heir, NP   albuterol 108 (90 Base) MCG/ACT inhaler Commonly known as: VENTOLIN HFA Inhale 2 puffs into the lungs every 4 (four) hours as needed for wheezing or shortness of breath.   alendronate 70 MG tablet Commonly known as: FOSAMAX Take 70 mg by mouth every Sunday. Take with a full glass of water on an empty stomach.   Align 4  MG Caps Take 4 mg by mouth at bedtime.   alum & mag hydroxide-simeth 400-400-40 MG/5ML suspension Commonly known as: MAALOX PLUS Take 30 mLs by mouth every 4 (four) hours as needed for indigestion.   Anoro Ellipta 62.5-25 MCG/ACT Aepb Generic drug: umeclidinium-vilanterol Inhale 1 puff into the lungs every morning.   docusate sodium 100 MG capsule Commonly known as: COLACE Take 100 mg by mouth at bedtime.   dorzolamide-timolol 2-0.5 % ophthalmic solution Commonly known as: COSOPT Place 1 drop into both eyes 2 (two) times daily.   ferrous gluconate 240 (27 FE) MG tablet Commonly known as: FERGON Take 240 mg by mouth every morning. Take without food   latanoprost 0.005 % ophthalmic solution Commonly known as: XALATAN Place 1 drop into both eyes at bedtime.   loratadine 10 MG tablet Commonly known as: CLARITIN Take  1 tablet (10 mg total) by mouth daily.   METOPROLOL TARTRATE PO Take 6.25 mg by mouth in the morning and at bedtime.   oxybutynin 5 MG tablet Commonly known as: DITROPAN Take 5 mg by mouth daily.   pantoprazole 40 MG tablet Commonly known as: PROTONIX Take 40 mg by mouth 2 (two) times daily.   polyethylene glycol 17 g packet Commonly known as: MIRALAX / GLYCOLAX Take 8.5 g by mouth daily as needed (constipation).   PreserVision AREDS 2 Caps Take 1 capsule by mouth 2 (two) times daily.   PreviDent 5000 Booster Plus 1.1 % Pste Generic drug: Sodium Fluoride Place 1 Application onto teeth See admin instructions. Brush on teeth with a toothbrush after evening mouth care. Spit out excess and do not rinse.   Rhopressa 0.02 % Soln Generic drug: Netarsudil Dimesylate Place 1 drop into both eyes at bedtime.   rOPINIRole 0.5 MG tablet Commonly known as: REQUIP Take 0.5 mg by mouth as needed.   sertraline 50 MG tablet Commonly known as: ZOLOFT Take 50 mg by mouth daily. What changed: Another medication with the same name was removed. Continue taking this  medication, and follow the directions you see here. Changed by: Octavia Heir, NP   simvastatin 20 MG tablet Commonly known as: ZOCOR Take 20 mg by mouth daily.   vitamin C 1000 MG tablet Take 1,000 mg by mouth every morning.   Vitamin D3 50 MCG (2000 UT) Tabs Take 2,000 Units by mouth every morning.   zinc oxide 20 % ointment Apply 1 Application topically See admin instructions. Apply topically to buttocks after every incontinent episode and as needed for redness        Review of Systems  Constitutional:  Negative for activity change, appetite change and fatigue.  HENT:  Positive for congestion, rhinorrhea and sneezing. Negative for ear pain, sinus pressure, sinus pain, sore throat and trouble swallowing.   Eyes:  Negative for visual disturbance.  Respiratory:  Positive for wheezing. Negative for cough and shortness of breath.   Cardiovascular:  Negative for chest pain and leg swelling.  Gastrointestinal:  Negative for nausea and vomiting.  Musculoskeletal:  Negative for myalgias.  Allergic/Immunologic: Positive for environmental allergies.  Neurological:  Negative for headaches.  Psychiatric/Behavioral:  Negative for dysphoric mood. The patient is not nervous/anxious.     Immunization History  Administered Date(s) Administered   Fluad Quad(high Dose 65+) 04/24/2022   Influenza, High Dose Seasonal PF 03/14/2018   PFIZER(Purple Top)SARS-COV-2 Vaccination 07/25/2019, 08/16/2019, 12/12/2021   Pneumococcal-Unspecified 03/02/2011   Tdap 03/12/2018   Zoster Recombinat (Shingrix) 10/08/2017, 12/09/2017   Pertinent  Health Maintenance Due  Topic Date Due   DEXA SCAN  Never done   INFLUENZA VACCINE  01/30/2023      05/06/2022    9:30 AM 05/06/2022    9:11 PM 05/07/2022   12:25 PM 05/17/2022   10:19 AM 07/12/2022   11:08 AM  Fall Risk  Falls in the past year?    1 0  Was there an injury with Fall?    1 0  Fall Risk Category Calculator    2 0  Fall Risk Category (Retired)     Moderate Low  (RETIRED) Patient Fall Risk Level Moderate fall risk Moderate fall risk Moderate fall risk Moderate fall risk Low fall risk  Patient at Risk for Falls Due to    History of fall(s) History of fall(s)  Fall risk Follow up    Falls evaluation completed Falls  evaluation completed   Functional Status Survey:    Vitals:   10/21/22 0958  BP: 127/70  Pulse: 72  Resp: 18  Temp: 97.6 F (36.4 C)  SpO2: 95%  Weight: 178 lb 14.4 oz (81.1 kg)  Height: 5\' 1"  (1.549 m)   Body mass index is 33.8 kg/m. Physical Exam Vitals reviewed.  Constitutional:      General: She is not in acute distress. HENT:     Head: Normocephalic.     Right Ear: There is no impacted cerumen.     Left Ear: There is no impacted cerumen.     Nose: Rhinorrhea present.     Mouth/Throat:     Mouth: Mucous membranes are moist.     Pharynx: No pharyngeal swelling, oropharyngeal exudate, posterior oropharyngeal erythema or uvula swelling.  Eyes:     General:        Right eye: No discharge.        Left eye: No discharge.  Cardiovascular:     Rate and Rhythm: Normal rate and regular rhythm.     Pulses: Normal pulses.     Heart sounds: Normal heart sounds.  Pulmonary:     Effort: Pulmonary effort is normal. No respiratory distress.     Breath sounds: Examination of the right-upper field reveals wheezing. Examination of the left-upper field reveals wheezing. Wheezing present. No rhonchi or rales.     Comments: Expiratory  Abdominal:     General: Bowel sounds are normal.     Palpations: Abdomen is soft.  Musculoskeletal:     Cervical back: Neck supple.     Right lower leg: No edema.     Left lower leg: No edema.  Skin:    General: Skin is warm and dry.     Capillary Refill: Capillary refill takes less than 2 seconds.  Neurological:     General: No focal deficit present.     Mental Status: She is alert and oriented to person, place, and time.     Motor: Weakness present.     Gait: Gait abnormal.   Psychiatric:        Mood and Affect: Mood normal.        Behavior: Behavior normal.     Labs reviewed: Recent Labs    01/13/22 0035 01/14/22 0309 05/02/22 1839 05/04/22 0752 05/05/22 0008 05/05/22 0631 05/06/22 0655  NA 141 141   < > 140  --  137 140  K 3.7 3.8   < > 3.2*  --  3.4* 4.0  CL 110 110   < > 113*  --  111 110  CO2 23 26   < > 22  --  24 27  GLUCOSE 123* 125*   < > 108*  --  119* 114*  BUN 10 11   < > 20  --  9 5*  CREATININE 0.59 0.58   < > 0.34*  --  0.46 0.43*  CALCIUM 8.5* 8.5*   < > 7.3*  --  7.5* 7.8*  MG 1.9 2.1  --   --  1.9  --   --    < > = values in this interval not displayed.   Recent Labs    01/08/22 0500 01/11/22 2146 05/02/22 1839  AST 16 23 18   ALT 13 15 14   ALKPHOS 36* 32* 46  BILITOT 0.4 0.4 0.4  PROT 5.0* 4.5* 6.1*  ALBUMIN 3.0* 2.6* 3.5   Recent Labs    05/04/22 1532 05/05/22 0008  05/05/22 0631 05/05/22 1056 05/06/22 0655 05/06/22 0656 05/06/22 1018 05/07/22 0954 06/13/22 0700  WBC 6.7  --  6.9  --  5.4  --   --   --  4.3  NEUTROABS 3.9  --  4.5  --  2.9  --   --   --  2,103.00  HGB 7.3*   < > 7.9*   < > 7.4*   < > 7.7* 8.0* 11.3*  HCT 22.0*   < > 24.8*   < > 23.2*   < > 24.5* 24.9* 35*  MCV 94.8  --  94.7  --  95.1  --   --   --   --   PLT 114*  --  107*  --  111*  --   --   --  160   < > = values in this interval not displayed.   Lab Results  Component Value Date   TSH 1.88 11/18/2017   No results found for: "HGBA1C" Lab Results  Component Value Date   CHOL 147 02/18/2022   HDL 51 02/18/2022   LDLCALC 72 02/18/2022   TRIG 162 (A) 02/18/2022    Significant Diagnostic Results in last 30 days:  No results found.  Assessment/Plan: 1. Wheezing - faint expiratory wheezing to upper lobes - start Duonebs BID x 2 days, then BID prn x 1 month - consider prednisone taper if no improvement  2. Seasonal allergies - clear nasal congestion, sneezing  - discontinue Claritin - start Zyrtec 10 mg po qhs - consider  Xyzal if no improvement     Family/ staff Communication: plan discussed with patient and nurse  Labs/tests ordered: none

## 2022-10-22 ENCOUNTER — Non-Acute Institutional Stay (SKILLED_NURSING_FACILITY): Payer: Medicare HMO | Admitting: Adult Health

## 2022-10-22 ENCOUNTER — Other Ambulatory Visit: Payer: Self-pay

## 2022-10-22 ENCOUNTER — Encounter: Payer: Self-pay | Admitting: Adult Health

## 2022-10-22 DIAGNOSIS — Z8719 Personal history of other diseases of the digestive system: Secondary | ICD-10-CM | POA: Diagnosis not present

## 2022-10-22 DIAGNOSIS — J449 Chronic obstructive pulmonary disease, unspecified: Secondary | ICD-10-CM | POA: Diagnosis not present

## 2022-10-22 DIAGNOSIS — M810 Age-related osteoporosis without current pathological fracture: Secondary | ICD-10-CM

## 2022-10-22 DIAGNOSIS — I10 Essential (primary) hypertension: Secondary | ICD-10-CM

## 2022-10-22 DIAGNOSIS — G2581 Restless legs syndrome: Secondary | ICD-10-CM | POA: Diagnosis not present

## 2022-10-22 DIAGNOSIS — D62 Acute posthemorrhagic anemia: Secondary | ICD-10-CM | POA: Diagnosis not present

## 2022-10-22 DIAGNOSIS — J302 Other seasonal allergic rhinitis: Secondary | ICD-10-CM

## 2022-10-22 DIAGNOSIS — K625 Hemorrhage of anus and rectum: Secondary | ICD-10-CM | POA: Diagnosis not present

## 2022-10-22 DIAGNOSIS — D500 Iron deficiency anemia secondary to blood loss (chronic): Secondary | ICD-10-CM

## 2022-10-22 MED ORDER — CETIRIZINE HCL 10 MG PO TABS: 10.0000 mg | ORAL_TABLET | Freq: Every day | ORAL | 11 refills | Status: DC

## 2022-10-22 NOTE — Progress Notes (Signed)
Location:  [facility] Room Number: 20A Place of Service:  ALF [phone]) Provider: Margit Banda. Grayce Sessions, DNP   Patient Care Team: Octavia Heir, NP as PCP - General (Adult Health Nurse Practitioner) [name], MD as Consulting Physician (Internal Medicine)  Extended Emergency Contact Information Primary Emergency Contact: Claxton,[name] Address: [address]          [place], [place] [address] [name] of [place] Home Phone: [phone] Mobile Phone: [phone] Relation: Spouse Secondary Emergency Contact: Kimetha, Trulson Mobile Phone: [phone] Relation: Son Interpreter needed? No  Code Status:  Full Code Goals of care: Advanced Directive information    10/22/2022    9:44 AM  Advanced Directives  Does Patient Have a Medical Advance Directive? No  Would patient like information on creating a medical advance directive? No - Patient declined     Chief Complaint  Patient presents with   Acute Visit    Rectal bleed    HPI:  Pt is a 82 y.o. female seen today for an acute visit for rectal bleeding. She is  aresident of [facility] [address] [facility]. She reported  that she started having diarrhea this morning then started to notice blood in the stool, bright red blood. She does not take any anticoagulant but does have a history of GI diverticular bleed. She takes Protonix 40 mg BID. She denies having abdominal pain. She does not want to go to the hospital.   Past Medical History:  Diagnosis Date   Anxiety    Arthritis    "bilateral knees, shoulders, elbows; neck, pretty widespread" (09/05/2017)   BPPV (benign paroxysmal positional vertigo)    Depression    GERD (gastroesophageal reflux disease)    Glaucoma, both eyes    Headache    "probably 2/month" (09/05/2017)   History of blood transfusion ~ 2008   "related to LGIB"   Hyperlipemia    Lower GI bleeding ~ 2008; 09/05/2017   "had to have blood transfusion"   Macular degeneration, bilateral     Osteopenia    Seasonal allergies    Skin cancer, basal cell 2001   "off my nose, left side"   Sleeping excessive    Tinnitus of both ears    Past Surgical History:  Procedure Laterality Date   BALLOON DILATION N/A 05/08/2021   Procedure: BALLOON DILATION;  Surgeon: Kerin Salen, MD;  Location: [facility] ENDOSCOPY;  Service: Gastroenterology;  Laterality: N/A;   BASAL CELL CARCINOMA EXCISION  2001   "off my nose, left side"   BIOPSY  05/08/2021   Procedure: BIOPSY;  Surgeon: Kerin Salen, MD;  Location: [facility] ENDOSCOPY;  Service: Gastroenterology;;   BLEPHAROPLASTY Bilateral    CATARACT EXTRACTION W/ INTRAOCULAR LENS  IMPLANT, BILATERAL Bilateral 1990's   COLONOSCOPY WITH PROPOFOL N/A 05/04/2022   Procedure: COLONOSCOPY WITH PROPOFOL;  Surgeon: Kerin Salen, MD;  Location: [facility] ENDOSCOPY;  Service: Gastroenterology;  Laterality: N/A;   ESOPHAGOGASTRODUODENOSCOPY (EGD) WITH PROPOFOL N/A 05/08/2021   Procedure: ESOPHAGOGASTRODUODENOSCOPY (EGD) WITH PROPOFOL;  Surgeon: Kerin Salen, MD;  Location: [facility] ENDOSCOPY;  Service: Gastroenterology;  Laterality: N/A;   ESOPHAGOGASTRODUODENOSCOPY (EGD) WITH PROPOFOL N/A 01/11/2022   Procedure: ESOPHAGOGASTRODUODENOSCOPY (EGD) WITH PROPOFOL;  Surgeon: Vida Rigger, MD;  Location: [facility] ENDOSCOPY;  Service: Gastroenterology;  Laterality: N/A;   EYE SURGERY Bilateral    "to improve vision after cataract OR"   IR ANGIOGRAM FOLLOW UP STUDY  12/19/2020   IR ANGIOGRAM SELECTIVE EACH ADDITIONAL VESSEL  12/19/2020   IR ANGIOGRAM SELECTIVE EACH  ADDITIONAL VESSEL  12/19/2020   IR ANGIOGRAM VISCERAL SELECTIVE  12/19/2020   IR EMBO ART  VEN HEMORR LYMPH EXTRAV  INC GUIDE ROADMAPPING  12/19/2020   IR US GUIDE VASC ACCESS RIGHT  12/19/2020   JOINT REPLACEMENT     POLYPECTOMY  05/04/2022   Procedure: POLYPECTOMY;  Surgeon: Kerin Salen, MD;  Location: [facility] ENDOSCOPY;  Service: Gastroenterology;;   STAPEDES SURGERY Left    "scraped stapedes because it was sticking when it wasn't suppose to"    TONSILLECTOMY AND ADENOIDECTOMY  1946   TOTAL KNEE ARTHROPLASTY Left ~ 2008   TOTAL KNEE ARTHROPLASTY  12/20/2011   Procedure: TOTAL KNEE ARTHROPLASTY;  Surgeon: Verlee Rossetti, MD;  Location: [facility] OR;  Service: Orthopedics;  Laterality: Right;  Right Total Knee Arthroplasty   TUBAL LIGATION  1980's    Allergies  Allergen Reactions   Morphine And Related Itching   Aspirin Other (See Comments)    Unknown reaction   Duloxetine Hcl Other (See Comments)    Unknown reaction   Tymlos [Abaloparatide] Other (See Comments)    Unknown reaction   Ventolin [Albuterol] Other (See Comments)    rapid heart beat   Cefadroxil Hives    Patient can take amoxicillin and cipro    Outpatient Encounter Medications as of 10/22/2022  Medication Sig   acetaminophen (TYLENOL) 650 MG CR tablet Take 650 mg by mouth in the morning and at bedtime.   albuterol (VENTOLIN HFA) 108 (90 Base) MCG/ACT inhaler Inhale 2 puffs into the lungs every 4 (four) hours as needed for wheezing or shortness of breath.   alendronate (FOSAMAX) 70 MG tablet Take 70 mg by mouth every Sunday. Take with a full glass of water on an empty stomach.   alum & mag hydroxide-simeth (MAALOX PLUS) 400-400-40 MG/5ML suspension Take 30 mLs by mouth every 4 (four) hours as needed for indigestion.   Ascorbic Acid (VITAMIN C) 1000 MG tablet Take 1,000 mg by mouth every morning.   cetirizine (ZYRTEC) 10 MG tablet Take 1 tablet (10 mg total) by mouth at bedtime.   Cholecalciferol (VITAMIN D3) 50 MCG (2000 UT) TABS Take 2,000 Units by mouth every morning.   docusate sodium (COLACE) 100 MG capsule Take 100 mg by mouth at bedtime.   dorzolamide-timolol (COSOPT) 22.3-6.8 MG/ML ophthalmic solution Place 1 drop into both eyes 2 (two) times daily.   ferrous gluconate (FERGON) 240 (27 FE) MG tablet Take 240 mg by mouth every morning. Take without food   latanoprost (XALATAN) 0.005 % ophthalmic solution Place 1 drop into both eyes at bedtime.   METOPROLOL TARTRATE  PO Take 6.25 mg by mouth in the morning and at bedtime.   Multiple Vitamins-Minerals (PRESERVISION AREDS 2) CAPS Take 1 capsule by mouth 2 (two) times daily.   Netarsudil Dimesylate (RHOPRESSA) 0.02 % SOLN Place 1 drop into both eyes at bedtime.   oxybutynin (DITROPAN) 5 MG tablet Take 5 mg by mouth daily.   pantoprazole (PROTONIX) 40 MG tablet Take 40 mg by mouth 2 (two) times daily.   polyethylene glycol (MIRALAX / GLYCOLAX) 17 g packet Take 8.5 g by mouth daily as needed (constipation).   Probiotic Product (ALIGN) 4 MG CAPS Take 4 mg by mouth at bedtime.   rOPINIRole (REQUIP) 0.5 MG tablet Take 0.5 mg by mouth as needed.   sertraline (ZOLOFT) 50 MG tablet Take 50 mg by mouth daily.   simvastatin (ZOCOR) 20 MG tablet Take 20 mg by mouth daily.   Sodium Fluoride (PREVIDENT 5000  BOOSTER PLUS) 1.1 % PSTE Place 1 Application onto teeth See admin instructions. Brush on teeth with a toothbrush after evening mouth care. Spit out excess and do not rinse.   umeclidinium-vilanterol (ANORO ELLIPTA) 62.5-25 MCG/ACT AEPB Inhale 1 puff into the lungs every morning.   zinc oxide 20 % ointment Apply 1 Application topically See admin instructions. Apply topically to buttocks after every incontinent episode and as needed for redness   No facility-administered encounter medications on file as of 10/22/2022.    Review of Systems  Constitutional:  Negative for appetite change, chills, fatigue and fever.  HENT:  Negative for congestion, hearing loss, rhinorrhea and sore throat.   Eyes: Negative.   Respiratory:  Positive for cough and wheezing. Negative for shortness of breath.   Cardiovascular:  Negative for chest pain, palpitations and leg swelling.  Gastrointestinal:  Positive for anal bleeding and diarrhea. Negative for abdominal pain, constipation, nausea, rectal pain and vomiting.  Genitourinary:  Negative for dysuria.  Musculoskeletal:  Negative for arthralgias, back pain and myalgias.  Skin:  Negative  for color change, rash and wound.  Neurological:  Negative for dizziness, weakness and headaches.  Psychiatric/Behavioral:  Negative for behavioral problems. The patient is not nervous/anxious.     Immunization History  Administered Date(s) Administered   Fluad Quad(high Dose 65+) 04/24/2022   Influenza, High Dose Seasonal PF 03/14/2018   PFIZER(Purple Top)SARS-COV-2 Vaccination 07/25/2019, 08/16/2019, 12/12/2021   Pneumococcal-Unspecified 03/02/2011   Tdap 03/12/2018   Zoster Recombinat (Shingrix) 10/08/2017, 12/09/2017   Pertinent  Health Maintenance Due  Topic Date Due   DEXA SCAN  Never done   INFLUENZA VACCINE  01/30/2023      05/06/2022    9:30 AM 05/06/2022    9:11 PM 05/07/2022   12:25 PM 05/17/2022   10:19 AM 07/12/2022   11:08 AM  Fall Risk  Falls in the past year?    1 0  Was there an injury with Fall?    1 0  Fall Risk Category Calculator    2 0  Fall Risk Category (Retired)    Moderate Low  (RETIRED) Patient Fall Risk Level Moderate fall risk Moderate fall risk Moderate fall risk Moderate fall risk Low fall risk  Patient at Risk for Falls Due to    History of fall(s) History of fall(s)  Fall risk Follow up    Falls evaluation completed Falls evaluation completed   Functional Status Survey:    Vitals:   10/22/22 0942  BP: 127/70  Pulse: 72  Resp: 18  Temp: 97.6 F (36.4 C)  SpO2: 95%  Weight: 178 lb 14.4 oz (81.1 kg)  Height: 5\' 1"  (1.549 m)   Body mass index is 33.8 kg/m. Physical Exam Constitutional:      General: She is not in acute distress.    Appearance: She is obese.  HENT:     Head: Normocephalic and atraumatic.     Nose: Nose normal.     Mouth/Throat:     Mouth: Mucous membranes are moist.  Eyes:     Conjunctiva/sclera: Conjunctivae normal.  Cardiovascular:     Rate and Rhythm: Normal rate and regular rhythm.  Pulmonary:     Effort: Pulmonary effort is normal.     Breath sounds: Normal breath sounds.  Abdominal:     General: Bowel  sounds are normal.     Palpations: Abdomen is soft.  Musculoskeletal:        General: Normal range of motion.     Cervical  back: Normal range of motion.  Skin:    General: Skin is warm and dry.  Neurological:     General: No focal deficit present.     Mental Status: She is alert and oriented to person, place, and time.  Psychiatric:        Mood and Affect: Mood normal.        Behavior: Behavior normal.        Thought Content: Thought content normal.        Judgment: Judgment normal.     Labs reviewed: Recent Labs    01/13/22 0035 01/14/22 0309 05/02/22 1839 05/04/22 0752 05/05/22 0008 05/05/22 0631 05/06/22 0655 10/08/22 0000  NA 141 141   < > 140  --  137 140 140  K 3.7 3.8   < > 3.2*  --  3.4* 4.0 4.5  CL 110 110   < > 113*  --  111 110 106  CO2 23 26   < > 22  --  24 27 29*  GLUCOSE 123* 125*   < > 108*  --  119* 114*  --   BUN 10 11   < > 20  --  9 5* 11  CREATININE 0.59 0.58   < > 0.34*  --  0.46 0.43* 0.5  CALCIUM 8.5* 8.5*   < > 7.3*  --  7.5* 7.8* 9.2  MG 1.9 2.1  --   --  1.9  --   --   --    < > = values in this interval not displayed.   Recent Labs    01/08/22 0500 01/11/22 2146 05/02/22 1839 10/08/22 0000  AST 16 23 18 15   ALT 13 15 14 10   ALKPHOS 36* 32* 46 50  BILITOT 0.4 0.4 0.4  --   PROT 5.0* 4.5* 6.1*  --   ALBUMIN 3.0* 2.6* 3.5 4.3   Recent Labs    05/04/22 1532 05/05/22 0008 05/05/22 0631 05/05/22 1056 05/06/22 0655 05/06/22 0656 05/07/22 0954 06/13/22 0700 10/08/22 0000  WBC 6.7  --  6.9  --  5.4  --   --  4.3 6.9  NEUTROABS 3.9  --  4.5  --  2.9  --   --  2,103.00 4,175.00  HGB 7.3*   < > 7.9*   < > 7.4*   < > 8.0* 11.3* 12.3  HCT 22.0*   < > 24.8*   < > 23.2*   < > 24.9* 35* 37  MCV 94.8  --  94.7  --  95.1  --   --   --   --   PLT 114*  --  107*  --  111*  --   --  160 165   < > = values in this interval not displayed.   Lab Results  Component Value Date   TSH 2.47 10/08/2022   No results found for: "HGBA1C" Lab  Results  Component Value Date   CHOL 147 02/18/2022   HDL 51 02/18/2022   LDLCALC 72 02/18/2022   TRIG 162 (A) 02/18/2022    Significant Diagnostic Results in last 30 days:  No results found.  Assessment/Plan  1. Rectal bleeding -  denies abdominal pain -  started with 3 episodes of diarrhea this morning then noted bright red blood in the stool which has stopped, stated during breakfast that bleeding has stopped for 3 hours already -  does not want to go to the hospital -  monitor for rectal  bleeding - CBC and BMP stat  2. History of GI diverticular bleed -  denies abdominal pain -  continue Protonix  3. COPD GOLD II  - +wheezing, no SOB AER -  continue Ipratropium-albuterol neb daily and PRN and Anoro Ellipta  4. Primary hypertension -  BP 127/70 -  continue Metoprolol tartrate -  Vital signs Q shift X 3 days  5. Iron deficiency anemia due to chronic blood loss Lab Results  Component Value Date   HGB 12.3 10/08/2022   -  continue FeSO4  6. Senile osteoporosis -  continue Alendronate -  fall precautions  7. Restless leg syndrome -  stable -  continue Ropinirole   Family/ staff Communication:  Discussed plan of care with resident and charge nurse.  Labs/tests ordered:  CBC and BMP stat

## 2022-10-22 NOTE — Telephone Encounter (Signed)
Medication had to be resent to pharmacy because class was set to "no print". High warning came up when trying to send to pharmacy.  Medication pended and sent to Hazle Nordmann, NP

## 2022-10-24 DIAGNOSIS — D649 Anemia, unspecified: Secondary | ICD-10-CM | POA: Diagnosis not present

## 2022-10-28 DIAGNOSIS — D649 Anemia, unspecified: Secondary | ICD-10-CM | POA: Diagnosis not present

## 2022-10-28 LAB — CBC: RBC: 2.84 — AB (ref 3.87–5.11)

## 2022-10-28 LAB — CBC AND DIFFERENTIAL
HCT: 27 — AB (ref 36–46)
Hemoglobin: 8.9 — AB (ref 12.0–16.0)
Neutrophils Absolute: 3103
Platelets: 202 10*3/uL (ref 150–400)
WBC: 5.8

## 2022-10-30 ENCOUNTER — Non-Acute Institutional Stay: Payer: Medicare HMO | Admitting: Orthopedic Surgery

## 2022-10-30 ENCOUNTER — Encounter: Payer: Self-pay | Admitting: Orthopedic Surgery

## 2022-10-30 DIAGNOSIS — K625 Hemorrhage of anus and rectum: Secondary | ICD-10-CM

## 2022-10-30 DIAGNOSIS — Z8719 Personal history of other diseases of the digestive system: Secondary | ICD-10-CM

## 2022-10-30 DIAGNOSIS — D62 Acute posthemorrhagic anemia: Secondary | ICD-10-CM

## 2022-10-30 NOTE — Progress Notes (Signed)
Location:   [facility]  Nursing Home Room Number: 20-A Place of Service:  SNF (31) Provider:  Hazle Nordmann, NP    Patient Care Team: Octavia Heir, NP as PCP - General (Adult Health Nurse Practitioner) [name], MD as Consulting Physician (Internal Medicine)  Extended Emergency Contact Information Primary Emergency Contact: Figg,[name] Address: [address]          [place], [place] [address] [name] of [place] Home Phone: [phone] Mobile Phone: [phone] Relation: Spouse Secondary Emergency Contact: Lannie, Heaps Mobile Phone: [phone] Relation: Son Interpreter needed? No  Code Status:  FULL CODE  Goals of care: Advanced Directive information    10/30/2022   10:34 AM  Advanced Directives  Does Patient Have a Medical Advance Directive? No  Would patient like information on creating a medical advance directive? No - Patient declined     Chief Complaint  Patient presents with   Medical Management of Chronic Issues    Routine Visit.    Health Maintenance    Discuss the need for Dexa scan.   Immunizations    Discuss the need for [employer].    HPI:  Pt is a 82 y.o. female seen today for acute visit due to recent rectal bleeding.   She currently resides on the assisted living unit at [facility]. PMH: HTN, COPD, pulmonary nodule, Diverticulosis with GI bleed, GERD, OA, osteoporosis,macular degeneration, iron def anemia, unstable gait, anxiety, and depression.   GI bleed- H/o diverticular bleed 11/2020 s/p embolization with recurrence 05/2021. Hospitalized 05/2022 due to ongoing rectal bleeding. NM bleeding scan showed active bleeding at splenic flexure. Embolization was not performed. She received 3 units PRBC and was dischared back to [facility]. Remains on Protonix.   04/23 she was using bathroom and noted large amount of blood with clots after having bowel movement. She had another episode 04/25. Hgb 8.9 (04/29)> was 10.5 (04/23)>  was 12.3 (04/08). Today, she denies additional episodes of rectal bleeding. She admits to feeling fatigued. She remains on ferrous gluconate and Protonix. Afebrile. Vitals stable. Denies dizziness or light headedness.    Past Medical History:  Diagnosis Date   Anxiety    Arthritis    "bilateral knees, shoulders, elbows; neck, pretty widespread" (09/05/2017)   BPPV (benign paroxysmal positional vertigo)    Depression    GERD (gastroesophageal reflux disease)    Glaucoma, both eyes    Headache    "probably 2/month" (09/05/2017)   History of blood transfusion ~ 2008   "related to LGIB"   Hyperlipemia    Lower GI bleeding ~ 2008; 09/05/2017   "had to have blood transfusion"   Macular degeneration, bilateral    Osteopenia    Seasonal allergies    Skin cancer, basal cell 2001   "off my nose, left side"   Sleeping excessive    Tinnitus of both ears    Past Surgical History:  Procedure Laterality Date   BALLOON DILATION N/A 05/08/2021   Procedure: BALLOON DILATION;  Surgeon: Kerin Salen, MD;  Location: [facility] ENDOSCOPY;  Service: Gastroenterology;  Laterality: N/A;   BASAL CELL CARCINOMA EXCISION  2001   "off my nose, left side"   BIOPSY  05/08/2021   Procedure: BIOPSY;  Surgeon: Kerin Salen, MD;  Location: [facility] ENDOSCOPY;  Service: Gastroenterology;;   BLEPHAROPLASTY Bilateral    CATARACT EXTRACTION W/ INTRAOCULAR LENS  IMPLANT, BILATERAL Bilateral 1990's   COLONOSCOPY WITH PROPOFOL N/A 05/04/2022   Procedure: COLONOSCOPY WITH PROPOFOL;  Surgeon: Marca Ancona,  Marcos Eke, MD;  Location: [facility] ENDOSCOPY;  Service: Gastroenterology;  Laterality: N/A;   ESOPHAGOGASTRODUODENOSCOPY (EGD) WITH PROPOFOL N/A 05/08/2021   Procedure: ESOPHAGOGASTRODUODENOSCOPY (EGD) WITH PROPOFOL;  Surgeon: Kerin Salen, MD;  Location: [facility] ENDOSCOPY;  Service: Gastroenterology;  Laterality: N/A;   ESOPHAGOGASTRODUODENOSCOPY (EGD) WITH PROPOFOL N/A 01/11/2022   Procedure: ESOPHAGOGASTRODUODENOSCOPY (EGD) WITH PROPOFOL;  Surgeon: Vida Rigger,  MD;  Location: [facility] ENDOSCOPY;  Service: Gastroenterology;  Laterality: N/A;   EYE SURGERY Bilateral    "to improve vision after cataract OR"   IR ANGIOGRAM FOLLOW UP STUDY  12/19/2020   IR ANGIOGRAM SELECTIVE EACH ADDITIONAL VESSEL  12/19/2020   IR ANGIOGRAM SELECTIVE EACH ADDITIONAL VESSEL  12/19/2020   IR ANGIOGRAM VISCERAL SELECTIVE  12/19/2020   IR EMBO ART  VEN HEMORR LYMPH EXTRAV  INC GUIDE ROADMAPPING  12/19/2020   IR US GUIDE VASC ACCESS RIGHT  12/19/2020   JOINT REPLACEMENT     POLYPECTOMY  05/04/2022   Procedure: POLYPECTOMY;  Surgeon: Kerin Salen, MD;  Location: [facility] ENDOSCOPY;  Service: Gastroenterology;;   STAPEDES SURGERY Left    "scraped stapedes because it was sticking when it wasn't suppose to"   TONSILLECTOMY AND ADENOIDECTOMY  1946   TOTAL KNEE ARTHROPLASTY Left ~ 2008   TOTAL KNEE ARTHROPLASTY  12/20/2011   Procedure: TOTAL KNEE ARTHROPLASTY;  Surgeon: Verlee Rossetti, MD;  Location: [facility] OR;  Service: Orthopedics;  Laterality: Right;  Right Total Knee Arthroplasty   TUBAL LIGATION  1980's    Allergies  Allergen Reactions   Morphine And Related Itching   Aspirin Other (See Comments)    Unknown reaction   Duloxetine Hcl Other (See Comments)    Unknown reaction   Tymlos [Abaloparatide] Other (See Comments)    Unknown reaction   Ventolin [Albuterol] Other (See Comments)    rapid heart beat   Cefadroxil Hives    Patient can take amoxicillin and cipro    Allergies as of 10/30/2022       Reactions   Morphine And Related Itching   Aspirin Other (See Comments)   Unknown reaction   Duloxetine Hcl Other (See Comments)   Unknown reaction   Tymlos [abaloparatide] Other (See Comments)   Unknown reaction   Ventolin [albuterol] Other (See Comments)   rapid heart beat   Cefadroxil Hives   Patient can take amoxicillin and cipro        Medication List        Accurate as of Oct 30, 2022 10:35 AM. If you have any questions, ask your nurse or doctor.          STOP  taking these medications    alum & mag hydroxide-simeth 400-400-40 MG/5ML suspension Commonly known as: MAALOX PLUS Stopped by: Octavia Heir, NP       TAKE these medications    acetaminophen 650 MG CR tablet Commonly known as: TYLENOL Take 650 mg by mouth in the morning and at bedtime.   albuterol 108 (90 Base) MCG/ACT inhaler Commonly known as: VENTOLIN HFA Inhale 2 puffs into the lungs every 4 (four) hours as needed for wheezing or shortness of breath.   alendronate 70 MG tablet Commonly known as: FOSAMAX Take 70 mg by mouth every Sunday. Take with a full glass of water on an empty stomach.   Align 4 MG Caps Take 4 mg by mouth at bedtime.   Anoro Ellipta 62.5-25 MCG/ACT Aepb Generic drug: umeclidinium-vilanterol Inhale 1 puff into the lungs every morning.   cetirizine 10 MG tablet Commonly known  as: ZYRTEC Take 1 tablet (10 mg total) by mouth at bedtime.   docusate sodium 100 MG capsule Commonly known as: COLACE Take 200 mg by mouth at bedtime.   dorzolamide-timolol 2-0.5 % ophthalmic solution Commonly known as: COSOPT Place 1 drop into both eyes 2 (two) times daily.   ferrous gluconate 240 (27 FE) MG tablet Commonly known as: FERGON Take 240 mg by mouth every morning. Take without food   ipratropium-albuterol 0.5-2.5 (3) MG/3ML Soln Commonly known as: DUONEB Take 3 mLs by nebulization as needed.   latanoprost 0.005 % ophthalmic solution Commonly known as: XALATAN Place 1 drop into both eyes at bedtime.   METOPROLOL TARTRATE PO Take 6.25 mg by mouth in the morning and at bedtime.   oxybutynin 5 MG tablet Commonly known as: DITROPAN Take 5 mg by mouth daily.   pantoprazole 40 MG tablet Commonly known as: PROTONIX Take 40 mg by mouth 2 (two) times daily.   polyethylene glycol 17 g packet Commonly known as: MIRALAX / GLYCOLAX Take 8.5 g by mouth daily as needed (constipation).   PreserVision AREDS 2 Caps Take 1 capsule by mouth 2 (two) times  daily.   PreviDent 5000 Booster Plus 1.1 % Pste Generic drug: Sodium Fluoride Place 1 Application onto teeth See admin instructions. Brush on teeth with a toothbrush after evening mouth care. Spit out excess and do not rinse.   Rhopressa 0.02 % Soln Generic drug: Netarsudil Dimesylate Place 1 drop into both eyes at bedtime.   rOPINIRole 0.5 MG tablet Commonly known as: REQUIP Take 0.5 mg by mouth as needed.   sertraline 50 MG tablet Commonly known as: ZOLOFT Take 50 mg by mouth daily.   simvastatin 20 MG tablet Commonly known as: ZOCOR Take 20 mg by mouth daily.   vitamin C 1000 MG tablet Take 1,000 mg by mouth every morning.   Vitamin D3 50 MCG (2000 UT) Tabs Take 2,000 Units by mouth every morning.   zinc oxide 20 % ointment Apply 1 Application topically See admin instructions. Apply topically to buttocks after every incontinent episode and as needed for redness        Review of Systems  Constitutional:  Positive for fatigue. Negative for activity change and appetite change.  HENT:  Negative for congestion and trouble swallowing.   Eyes:  Negative for visual disturbance.  Respiratory:  Negative for cough, shortness of breath and wheezing.   Cardiovascular:  Negative for chest pain and leg swelling.  Gastrointestinal:  Positive for blood in stool. Negative for abdominal distention, abdominal pain, constipation, diarrhea and rectal pain.  Musculoskeletal:  Positive for gait problem.  Psychiatric/Behavioral:  Negative for confusion and dysphoric mood. The patient is not nervous/anxious.     Immunization History  Administered Date(s) Administered   Fluad Quad(high Dose 65+) 04/24/2022   Influenza, High Dose Seasonal PF 03/14/2018   PFIZER(Purple Top)SARS-COV-2 Vaccination 07/25/2019, 08/16/2019, 12/12/2021   Pneumococcal-Unspecified 03/02/2011   Tdap 03/12/2018   Zoster Recombinat (Shingrix) 10/08/2017, 12/09/2017   Pertinent  Health Maintenance Due  Topic Date  Due   DEXA SCAN  Never done   INFLUENZA VACCINE  01/30/2023      05/06/2022    9:30 AM 05/06/2022    9:11 PM 05/07/2022   12:25 PM 05/17/2022   10:19 AM 07/12/2022   11:08 AM  Fall Risk  Falls in the past year?    1 0  Was there an injury with Fall?    1 0  Fall Risk Category Calculator  2 0  Fall Risk Category (Retired)    Moderate Low  (RETIRED) Patient Fall Risk Level Moderate fall risk Moderate fall risk Moderate fall risk Moderate fall risk Low fall risk  Patient at Risk for Falls Due to    History of fall(s) History of fall(s)  Fall risk Follow up    Falls evaluation completed Falls evaluation completed   Functional Status Survey:    Vitals:   10/30/22 1025  BP: (!) 148/63  Pulse: 63  Resp: 20  Temp: 97.8 F (36.6 C)  SpO2: 97%  Weight: 178 lb 14.4 oz (81.1 kg)  Height: 5\' 1"  (1.549 m)   Body mass index is 33.8 kg/m. Physical Exam Vitals reviewed.  Constitutional:      Comments: Pale complexion  HENT:     Head: Normocephalic.  Eyes:     General:        Right eye: No discharge.        Left eye: No discharge.  Cardiovascular:     Rate and Rhythm: Normal rate and regular rhythm.     Pulses: Normal pulses.     Heart sounds: Normal heart sounds.  Pulmonary:     Effort: Pulmonary effort is normal. No respiratory distress.     Breath sounds: Normal breath sounds. No wheezing or rales.  Abdominal:     General: Bowel sounds are normal. There is no distension.     Palpations: Abdomen is soft. There is no mass.     Tenderness: There is no abdominal tenderness. There is no guarding or rebound.     Hernia: No hernia is present.  Musculoskeletal:     Cervical back: Neck supple.     Right lower leg: No edema.     Left lower leg: No edema.  Skin:    General: Skin is warm.     Capillary Refill: Capillary refill takes less than 2 seconds.  Neurological:     General: No focal deficit present.     Mental Status: She is alert and oriented to person, place, and time.      Motor: Weakness present.     Gait: Gait abnormal.     Comments: walker  Psychiatric:        Mood and Affect: Mood normal.     Labs reviewed: Recent Labs    01/13/22 0035 01/14/22 0309 05/02/22 1839 05/04/22 0752 05/05/22 0008 05/05/22 0631 05/06/22 0655 10/08/22 0000  NA 141 141   < > 140  --  137 140 140  K 3.7 3.8   < > 3.2*  --  3.4* 4.0 4.5  CL 110 110   < > 113*  --  111 110 106  CO2 23 26   < > 22  --  24 27 29*  GLUCOSE 123* 125*   < > 108*  --  119* 114*  --   BUN 10 11   < > 20  --  9 5* 11  CREATININE 0.59 0.58   < > 0.34*  --  0.46 0.43* 0.5  CALCIUM 8.5* 8.5*   < > 7.3*  --  7.5* 7.8* 9.2  MG 1.9 2.1  --   --  1.9  --   --   --    < > = values in this interval not displayed.   Recent Labs    01/08/22 0500 01/11/22 2146 05/02/22 1839 10/08/22 0000  AST 16 23 18 15   ALT 13 15 14 10   ALKPHOS 36*  32* 46 50  BILITOT 0.4 0.4 0.4  --   PROT 5.0* 4.5* 6.1*  --   ALBUMIN 3.0* 2.6* 3.5 4.3   Recent Labs    05/04/22 1532 05/05/22 0008 05/05/22 0631 05/05/22 1056 05/06/22 0655 05/06/22 0656 05/07/22 0954 06/13/22 0700 10/08/22 0000  WBC 6.7  --  6.9  --  5.4  --   --  4.3 6.9  NEUTROABS 3.9  --  4.5  --  2.9  --   --  2,103.00 4,175.00  HGB 7.3*   < > 7.9*   < > 7.4*   < > 8.0* 11.3* 12.3  HCT 22.0*   < > 24.8*   < > 23.2*   < > 24.9* 35* 37  MCV 94.8  --  94.7  --  95.1  --   --   --   --   PLT 114*  --  107*  --  111*  --   --  160 165   < > = values in this interval not displayed.   Lab Results  Component Value Date   TSH 2.47 10/08/2022   No results found for: "HGBA1C" Lab Results  Component Value Date   CHOL 147 02/18/2022   HDL 51 02/18/2022   LDLCALC 72 02/18/2022   TRIG 162 (A) 02/18/2022    Significant Diagnostic Results in last 30 days:  No results found.  Assessment/Plan 1. Rectal bleeding - 2 episodes on 04/23 & 04/25 - blood with clots noted after bowel movement per nursing - vitals stable, asymptomatic - appears hgb  drops about 2 points with each episode - resolved at this time - advised to tell nursing if another episode - cbc/diff 11/04/2022  2. Acute blood loss anemia - see above - cont ferrous gluconate  3. History of GI diverticular bleed - diverticular bleed 11/2020 s/p embolization with recurrence 05/2021 - 05/2022 hospitalized with active bleeding at splenic flexure> no embolization> surgery refused by patient> received 3 units PRBC - cont Protonix     Family/ staff Communication: plan discussed with patient and nurse  Labs/tests ordered:  cbc/diff 11/04/2022

## 2022-11-04 DIAGNOSIS — D649 Anemia, unspecified: Secondary | ICD-10-CM | POA: Diagnosis not present

## 2022-11-04 LAB — CBC: RBC: 3.15 — AB (ref 3.87–5.11)

## 2022-11-04 LAB — CBC AND DIFFERENTIAL
HCT: 31 — AB (ref 36–46)
Hemoglobin: 10 — AB (ref 12.0–16.0)
Neutrophils Absolute: 2785
Platelets: 190 10*3/uL (ref 150–400)
WBC: 5.1

## 2022-11-27 DIAGNOSIS — H401132 Primary open-angle glaucoma, bilateral, moderate stage: Secondary | ICD-10-CM | POA: Diagnosis not present

## 2022-11-27 DIAGNOSIS — H353131 Nonexudative age-related macular degeneration, bilateral, early dry stage: Secondary | ICD-10-CM | POA: Diagnosis not present

## 2022-12-03 ENCOUNTER — Non-Acute Institutional Stay (SKILLED_NURSING_FACILITY): Payer: Medicare HMO | Admitting: Adult Health

## 2022-12-03 ENCOUNTER — Encounter: Payer: Self-pay | Admitting: Adult Health

## 2022-12-03 DIAGNOSIS — J449 Chronic obstructive pulmonary disease, unspecified: Secondary | ICD-10-CM | POA: Diagnosis not present

## 2022-12-03 DIAGNOSIS — H1013 Acute atopic conjunctivitis, bilateral: Secondary | ICD-10-CM | POA: Diagnosis not present

## 2022-12-03 DIAGNOSIS — J302 Other seasonal allergic rhinitis: Secondary | ICD-10-CM | POA: Diagnosis not present

## 2022-12-03 NOTE — Progress Notes (Signed)
Location:  [facility] Room Number: 20 A Place of Service:  SNF (31) Provider:  Kenard Gower, DNP, FNP-BC  Patient Care Team: Octavia Heir, NP as PCP - General (Adult Health Nurse Practitioner) [name], MD as Consulting Physician (Internal Medicine)  Extended Emergency Contact Information Primary Emergency Contact: Ryser,[name] Address: [address]          [place], [place] [address] [name] of [place] Home Phone: [phone] Mobile Phone: [phone] Relation: Spouse Secondary Emergency Contact: Joel, [place] Mobile Phone: [phone] Relation: Son Interpreter needed? No  Code Status:  Full Code  Goals of care: Advanced Directive information    12/03/2022   11:48 AM  Advanced Directives  Does Patient Have a Medical Advance Directive? No  Would patient like information on creating a medical advance directive? No - Patient declined     Chief Complaint  Patient presents with   Acute Visit    Red eyes    HPI:   Pt is a 82 y.o. female seen today for an acute visit regarding red eyes. She is a resident of [facility]. She was noted to have bilateral reddish sclerae and conjunctivae. She complained that both eyes feel grainy and noticed crusting on her left eye this morning. She denies eye pain nor change in vision.   Past Medical History:  Diagnosis Date   Anxiety    Arthritis    "bilateral knees, shoulders, elbows; neck, pretty widespread" (09/05/2017)   BPPV (benign paroxysmal positional vertigo)    Depression    GERD (gastroesophageal reflux disease)    Glaucoma, both eyes    Headache    "probably 2/month" (09/05/2017)   History of blood transfusion ~ 2008   "related to LGIB"   Hyperlipemia    Lower GI bleeding ~ 2008; 09/05/2017   "had to have blood transfusion"   Macular degeneration, bilateral    Osteopenia    Seasonal allergies    Skin cancer, basal cell 2001   "off my nose, left side"   Sleeping  excessive    Tinnitus of both ears    Past Surgical History:  Procedure Laterality Date   BALLOON DILATION N/A 05/08/2021   Procedure: BALLOON DILATION;  Surgeon: Kerin Salen, MD;  Location: [facility] ENDOSCOPY;  Service: Gastroenterology;  Laterality: N/A;   BASAL CELL CARCINOMA EXCISION  2001   "off my nose, left side"   BIOPSY  05/08/2021   Procedure: BIOPSY;  Surgeon: Kerin Salen, MD;  Location: [facility] ENDOSCOPY;  Service: Gastroenterology;;   BLEPHAROPLASTY Bilateral    CATARACT EXTRACTION W/ INTRAOCULAR LENS  IMPLANT, BILATERAL Bilateral 1990's   COLONOSCOPY WITH PROPOFOL N/A 05/04/2022   Procedure: COLONOSCOPY WITH PROPOFOL;  Surgeon: Kerin Salen, MD;  Location: [facility] ENDOSCOPY;  Service: Gastroenterology;  Laterality: N/A;   ESOPHAGOGASTRODUODENOSCOPY (EGD) WITH PROPOFOL N/A 05/08/2021   Procedure: ESOPHAGOGASTRODUODENOSCOPY (EGD) WITH PROPOFOL;  Surgeon: Kerin Salen, MD;  Location[facility] ENDOSCOPY;  Service: Gastroenterology;  Laterality: N/A;   ESOPHAGOGASTRODUODENOSCOPY (EGD) WITH PROPOFOL N/A 01/11/2022   Procedure: ESOPHAGOGASTRODUODENOSCOPY (EGD) WITH PROPOFOL;  Surgeon: Vida Rigger, MD;  Location: [facility] ENDOSCOPY;  Service: Gastroenterology;  Laterality: N/A;   EYE SURGERY Bilateral    "to improve vision after cataract OR"   IR ANGIOGRAM FOLLOW UP STUDY  12/19/2020   IR ANGIOGRAM SELECTIVE EACH ADDITIONAL VESSEL  12/19/2020   IR ANGIOGRAM SELECTIVE EACH ADDITIONAL VESSEL  12/19/2020   IR ANGIOGRAM VISCERAL SELECTIVE  12/19/2020   IR EMBO ART  VEN HEMORR LYMPH  EXTRAV  INC GUIDE ROADMAPPING  12/19/2020   IR US GUIDE VASC ACCESS RIGHT  12/19/2020   JOINT REPLACEMENT     POLYPECTOMY  05/04/2022   Procedure: POLYPECTOMY;  Surgeon: Kerin Salen, MD;  Location: [facility] ENDOSCOPY;  Service: Gastroenterology;;   STAPEDES SURGERY Left    "scraped stapedes because it was sticking when it wasn't suppose to"   TONSILLECTOMY AND ADENOIDECTOMY  1946   TOTAL KNEE ARTHROPLASTY Left ~ 2008   TOTAL KNEE ARTHROPLASTY   12/20/2011   Procedure: TOTAL KNEE ARTHROPLASTY;  Surgeon: Verlee Rossetti, MD;  Location: [facility] OR;  Service: Orthopedics;  Laterality: Right;  Right Total Knee Arthroplasty   TUBAL LIGATION  1980's    Allergies  Allergen Reactions   Morphine And Codeine Itching   Aspirin Other (See Comments)    Unknown reaction   Duloxetine Hcl Other (See Comments)    Unknown reaction   Tymlos [Abaloparatide] Other (See Comments)    Unknown reaction   Ventolin [Albuterol] Other (See Comments)    rapid heart beat   Cefadroxil Hives    Patient can take amoxicillin and cipro    Outpatient Encounter Medications as of 12/03/2022  Medication Sig   acetaminophen (TYLENOL) 650 MG CR tablet Take 650 mg by mouth in the morning and at bedtime.   albuterol (VENTOLIN HFA) 108 (90 Base) MCG/ACT inhaler Inhale 2 puffs into the lungs every 4 (four) hours as needed for wheezing or shortness of breath.   alendronate (FOSAMAX) 70 MG tablet Take 70 mg by mouth every Sunday. Take with a full glass of water on an empty stomach.   Ascorbic Acid (VITAMIN C) 1000 MG tablet Take 1,000 mg by mouth every morning.   cetirizine (ZYRTEC) 10 MG tablet Take 1 tablet (10 mg total) by mouth at bedtime.   Cholecalciferol (VITAMIN D3) 50 MCG (2000 UT) TABS Take 2,000 Units by mouth every morning.   docusate sodium (COLACE) 100 MG capsule Take 200 mg by mouth at bedtime.   dorzolamide-timolol (COSOPT) 22.3-6.8 MG/ML ophthalmic solution Place 1 drop into both eyes 2 (two) times daily.   ferrous gluconate (FERGON) 240 (27 FE) MG tablet Take 240 mg by mouth every morning. Take without food   ipratropium-albuterol (DUONEB) 0.5-2.5 (3) MG/3ML SOLN Take 3 mLs by nebulization as needed.   latanoprost (XALATAN) 0.005 % ophthalmic solution Place 1 drop into both eyes at bedtime.   METOPROLOL TARTRATE PO Take 6.25 mg by mouth in the morning and at bedtime.   Multiple Vitamins-Minerals (PRESERVISION AREDS 2) CAPS Take 1 capsule by mouth 2 (two) times  daily.   Netarsudil Dimesylate (RHOPRESSA) 0.02 % SOLN Place 1 drop into both eyes at bedtime.   oxybutynin (DITROPAN) 5 MG tablet Take 5 mg by mouth daily.   pantoprazole (PROTONIX) 40 MG tablet Take 40 mg by mouth 2 (two) times daily.   polyethylene glycol (MIRALAX / GLYCOLAX) 17 g packet Take 8.5 g by mouth daily as needed (constipation).   Probiotic Product (ALIGN) 4 MG CAPS Take 4 mg by mouth at bedtime.   rOPINIRole (REQUIP) 0.5 MG tablet Take 0.5 mg by mouth as needed.   sertraline (ZOLOFT) 50 MG tablet Take 50 mg by mouth daily.   simvastatin (ZOCOR) 20 MG tablet Take 20 mg by mouth daily.   Sodium Fluoride (PREVIDENT 5000 BOOSTER PLUS) 1.1 % PSTE Place 1 Application onto teeth See admin instructions. Brush on teeth with a toothbrush after evening mouth care. Spit out excess and do not rinse.  umeclidinium-vilanterol (ANORO ELLIPTA) 62.5-25 MCG/ACT AEPB Inhale 1 puff into the lungs every morning.   zinc oxide 20 % ointment Apply 1 Application topically See admin instructions. Apply topically to buttocks after every incontinent episode and as needed for redness   No facility-administered encounter medications on file as of 12/03/2022.    Review of Systems  Constitutional:  Negative for appetite change, chills, fatigue and fever.  HENT:  Negative for congestion, hearing loss, rhinorrhea and sore throat.   Eyes:  Positive for discharge and redness. Negative for pain, itching and visual disturbance.  Respiratory:  Negative for cough, shortness of breath and wheezing.   Cardiovascular:  Negative for chest pain, palpitations and leg swelling.  Gastrointestinal:  Negative for abdominal pain, constipation, diarrhea, nausea and vomiting.  Genitourinary:  Negative for dysuria.  Musculoskeletal:  Negative for arthralgias, back pain and myalgias.  Skin:  Negative for color change, rash and wound.  Neurological:  Negative for dizziness, weakness and headaches.  Psychiatric/Behavioral:  Negative  for behavioral problems. The patient is not nervous/anxious.       Immunization History  Administered Date(s) Administered   Fluad Quad(high Dose 65+) 04/24/2022   Influenza, High Dose Seasonal PF 03/14/2018   PFIZER(Purple Top)SARS-COV-2 Vaccination 07/25/2019, 08/16/2019, 12/12/2021   Pneumococcal-Unspecified 03/02/2011   Tdap 03/12/2018   Zoster Recombinat (Shingrix) 10/08/2017, 12/09/2017   Pertinent  Health Maintenance Due  Topic Date Due   DEXA SCAN  Never done   INFLUENZA VACCINE  01/30/2023      05/06/2022    9:11 PM 05/07/2022   12:25 PM 05/17/2022   10:19 AM 07/12/2022   11:08 AM 10/30/2022   12:44 PM  Fall Risk  Falls in the past year?   1 0 0  Was there an injury with Fall?   1 0 0  Fall Risk Category Calculator   2 0 0  Fall Risk Category (Retired)   Moderate Low   (RETIRED) Patient Fall Risk Level Moderate fall risk Moderate fall risk Moderate fall risk Low fall risk   Patient at Risk for Falls Due to   History of fall(s) History of fall(s) History of fall(s);Impaired balance/gait;Impaired mobility  Fall risk Follow up   Falls evaluation completed Falls evaluation completed Falls evaluation completed;Education provided;Falls prevention discussed     Vitals:   12/03/22 1151  BP: 126/69  Pulse: 85  Resp: (!) 22  Temp: 98.2 F (36.8 C)  SpO2: 95%  Weight: 183 lb (83 kg)  Height: 5\' 1"  (1.549 m)   Body mass index is 34.58 kg/m.  Physical Exam Constitutional:      General: She is not in acute distress.    Appearance: She is obese.  HENT:     Head: Normocephalic and atraumatic.     Nose: Nose normal.     Mouth/Throat:     Mouth: Mucous membranes are moist.  Eyes:     Comments: Bilateral sclerae and conjunctivae reddish  Cardiovascular:     Rate and Rhythm: Normal rate and regular rhythm.  Pulmonary:     Effort: Pulmonary effort is normal.     Breath sounds: Normal breath sounds.  Abdominal:     General: Bowel sounds are normal.     Palpations:  Abdomen is soft.  Musculoskeletal:        General: Normal range of motion.     Cervical back: Normal range of motion.  Skin:    General: Skin is warm and dry.  Neurological:     Mental  Status: She is alert and oriented to person, place, and time. Mental status is at baseline.  Psychiatric:        Mood and Affect: Mood normal.        Behavior: Behavior normal.        Thought Content: Thought content normal.        Judgment: Judgment normal.        Labs reviewed: Recent Labs    01/13/22 0035 01/14/22 0309 05/02/22 1839 05/04/22 0752 05/05/22 0008 05/05/22 0631 05/06/22 0655 10/08/22 0000  NA 141 141   < > 140  --  137 140 140  K 3.7 3.8   < > 3.2*  --  3.4* 4.0 4.5  CL 110 110   < > 113*  --  111 110 106  CO2 23 26   < > 22  --  24 27 29*  GLUCOSE 123* 125*   < > 108*  --  119* 114*  --   BUN 10 11   < > 20  --  9 5* 11  CREATININE 0.59 0.58   < > 0.34*  --  0.46 0.43* 0.5  CALCIUM 8.5* 8.5*   < > 7.3*  --  7.5* 7.8* 9.2  MG 1.9 2.1  --   --  1.9  --   --   --    < > = values in this interval not displayed.   Recent Labs    01/08/22 0500 01/11/22 2146 05/02/22 1839 10/08/22 0000  AST 16 23 18 15   ALT 13 15 14 10   ALKPHOS 36* 32* 46 50  BILITOT 0.4 0.4 0.4  --   PROT 5.0* 4.5* 6.1*  --   ALBUMIN 3.0* 2.6* 3.5 4.3   Recent Labs    05/04/22 1532 05/05/22 0008 05/05/22 0631 05/05/22 1056 05/06/22 0655 05/06/22 0656 05/07/22 0954 06/13/22 0700 10/08/22 0000  WBC 6.7  --  6.9  --  5.4  --   --  4.3 6.9  NEUTROABS 3.9  --  4.5  --  2.9  --   --  2,103.00 4,175.00  HGB 7.3*   < > 7.9*   < > 7.4*   < > 8.0* 11.3* 12.3  HCT 22.0*   < > 24.8*   < > 23.2*   < > 24.9* 35* 37  MCV 94.8  --  94.7  --  95.1  --   --   --   --   PLT 114*  --  107*  --  111*  --   --  160 165   < > = values in this interval not displayed.   Lab Results  Component Value Date   TSH 2.47 10/08/2022   No results found for: "HGBA1C" Lab Results  Component Value Date   CHOL 147  02/18/2022   HDL 51 02/18/2022   LDLCALC 72 02/18/2022   TRIG 162 (A) 02/18/2022    Significant Diagnostic Results in last 30 days:  No results found.  Assessment/Plan  1. Allergic conjunctivitis of both eyes -  will start on Olapatadine ophthalmic solution 1% instill 1 gtt to both eyes BID X 2 weeks -  eye care daily -  wash hands before touching eyes  2. Seasonal allergies -  stable -  continue Zyrtec  3. COPD GOLD II  -  no wheezing -  continue Anoro ellipta    Family/ staff Communication: Discussed plan of care with resident and charge nurse.  Labs/tests ordered: None    Kenard Gower, DNP, MSN, FNP-BC [facility] [phone] (Monday-Friday 8:00 a.m. - 5:00 p.m.) [phone] (after hours)

## 2022-12-06 NOTE — Progress Notes (Deleted)
Location:  [facility] Room Number: 20 A Place of Service:  ALF [phone]) Provider:  Kenard Gower, DNP, FNP-BC  Patient Care Team: Octavia Heir, NP as PCP - General (Adult Health Nurse Practitioner) [name], MD as Consulting Physician (Internal Medicine)  Extended Emergency Contact Information Primary Emergency Contact: Devan,[name] Address: [address]          [place], [place] [address] [name] of [place] Home Phone: [phone] Mobile Phone: [phone] Relation: Spouse Secondary Emergency Contact: Kimbly, [place] Mobile Phone: [phone] Relation: Son Interpreter needed? No  Code Status:   Full Code  Goals of care: Advanced Directive information    12/03/2022   11:48 AM  Advanced Directives  Does Patient Have a Medical Advance Directive? No  Would patient like information on creating a medical advance directive? No - Patient declined     Chief Complaint  Patient presents with   Acute Visit    Red eyes    HPI:  Pt is a 82 y.o. female seen today for an acute visit regarding red eyes. She is a resident of [facility]. She was noted to have bilateral reddish sclerae and conjunctiva. She stated that both eyes feels grainy. She stated that this morning, she woke up with left eye clear discharge and crusting. She denies change in vision nor eye pain.   Past Medical History:  Diagnosis Date   Anxiety    Arthritis    "bilateral knees, shoulders, elbows; neck, pretty widespread" (09/05/2017)   BPPV (benign paroxysmal positional vertigo)    Depression    GERD (gastroesophageal reflux disease)    Glaucoma, both eyes    Headache    "probably 2/month" (09/05/2017)   History of blood transfusion ~ 2008   "related to LGIB"   Hyperlipemia    Lower GI bleeding ~ 2008; 09/05/2017   "had to have blood transfusion"   Macular degeneration, bilateral    Osteopenia    Seasonal allergies    Skin cancer, basal cell 2001   "off my  nose, left side"   Sleeping excessive    Tinnitus of both ears    Past Surgical History:  Procedure Laterality Date   BALLOON DILATION N/A 05/08/2021   Procedure: BALLOON DILATION;  Surgeon: Kerin Salen, MD;  Location: [facility] ENDOSCOPY;  Service: Gastroenterology;  Laterality: N/A;   BASAL CELL CARCINOMA EXCISION  2001   "off my nose, left side"   BIOPSY  05/08/2021   Procedure: BIOPSY;  Surgeon: Kerin Salen, MD;  Location: [facility] ENDOSCOPY;  Service: Gastroenterology;;   BLEPHAROPLASTY Bilateral    CATARACT EXTRACTION W/ INTRAOCULAR LENS  IMPLANT, BILATERAL Bilateral 1990's   COLONOSCOPY WITH PROPOFOL N/A 05/04/2022   Procedure: COLONOSCOPY WITH PROPOFOL;  Surgeon: Kerin Salen, MD;  Location: [facility] ENDOSCOPY;  Service: Gastroenterology;  Laterality: N/A;   ESOPHAGOGASTRODUODENOSCOPY (EGD) WITH PROPOFOL N/A 05/08/2021   Procedure: ESOPHAGOGASTRODUODENOSCOPY (EGD) WITH PROPOFOL;  Surgeon: Kerin Salen, MD;  Location: [facility] ENDOSCOPY;  Service: Gastroenterology;  Laterality: N/A;   ESOPHAGOGASTRODUODENOSCOPY (EGD) WITH PROPOFOL N/A 01/11/2022   Procedure: ESOPHAGOGASTRODUODENOSCOPY (EGD) WITH PROPOFOL;  Surgeon: Vida Rigger, MD;  Location: [facility] ENDOSCOPY;  Service: Gastroenterology;  Laterality: N/A;   EYE SURGERY Bilateral    "to improve vision after cataract OR"   IR ANGIOGRAM FOLLOW UP STUDY  12/19/2020   IR ANGIOGRAM SELECTIVE EACH ADDITIONAL VESSEL  12/19/2020   IR ANGIOGRAM SELECTIVE EACH ADDITIONAL VESSEL  12/19/2020   IR ANGIOGRAM VISCERAL SELECTIVE  12/19/2020   IR  EMBO ART  VEN HEMORR LYMPH EXTRAV  INC GUIDE ROADMAPPING  12/19/2020   IR US GUIDE VASC ACCESS RIGHT  12/19/2020   JOINT REPLACEMENT     POLYPECTOMY  05/04/2022   Procedure: POLYPECTOMY;  Surgeon: Kerin Salen, MD;  Location: [facility] ENDOSCOPY;  Service: Gastroenterology;;   STAPEDES SURGERY Left    "scraped stapedes because it was sticking when it wasn't suppose to"   TONSILLECTOMY AND ADENOIDECTOMY  1946   TOTAL KNEE ARTHROPLASTY Left ~ 2008    TOTAL KNEE ARTHROPLASTY  12/20/2011   Procedure: TOTAL KNEE ARTHROPLASTY;  Surgeon: Verlee Rossetti, MD;  Location: [facility] OR;  Service: Orthopedics;  Laterality: Right;  Right Total Knee Arthroplasty   TUBAL LIGATION  1980's    Allergies  Allergen Reactions   Morphine And Codeine Itching   Aspirin Other (See Comments)    Unknown reaction   Duloxetine Hcl Other (See Comments)    Unknown reaction   Tymlos [Abaloparatide] Other (See Comments)    Unknown reaction   Ventolin [Albuterol] Other (See Comments)    rapid heart beat   Cefadroxil Hives    Patient can take amoxicillin and cipro    Outpatient Encounter Medications as of 12/03/2022  Medication Sig   acetaminophen (TYLENOL) 650 MG CR tablet Take 650 mg by mouth in the morning and at bedtime.   albuterol (VENTOLIN HFA) 108 (90 Base) MCG/ACT inhaler Inhale 2 puffs into the lungs every 4 (four) hours as needed for wheezing or shortness of breath.   alendronate (FOSAMAX) 70 MG tablet Take 70 mg by mouth every Sunday. Take with a full glass of water on an empty stomach.   Ascorbic Acid (VITAMIN C) 1000 MG tablet Take 1,000 mg by mouth every morning.   cetirizine (ZYRTEC) 10 MG tablet Take 1 tablet (10 mg total) by mouth at bedtime.   Cholecalciferol (VITAMIN D3) 50 MCG (2000 UT) TABS Take 2,000 Units by mouth every morning.   docusate sodium (COLACE) 100 MG capsule Take 200 mg by mouth at bedtime.   dorzolamide-timolol (COSOPT) 22.3-6.8 MG/ML ophthalmic solution Place 1 drop into both eyes 2 (two) times daily.   ferrous gluconate (FERGON) 240 (27 FE) MG tablet Take 240 mg by mouth every morning. Take without food   ipratropium-albuterol (DUONEB) 0.5-2.5 (3) MG/3ML SOLN Take 3 mLs by nebulization as needed.   latanoprost (XALATAN) 0.005 % ophthalmic solution Place 1 drop into both eyes at bedtime.   METOPROLOL TARTRATE PO Take 6.25 mg by mouth in the morning and at bedtime.   Multiple Vitamins-Minerals (PRESERVISION AREDS 2) CAPS Take 1  capsule by mouth 2 (two) times daily.   Netarsudil Dimesylate (RHOPRESSA) 0.02 % SOLN Place 1 drop into both eyes at bedtime.   oxybutynin (DITROPAN) 5 MG tablet Take 5 mg by mouth daily.   pantoprazole (PROTONIX) 40 MG tablet Take 40 mg by mouth 2 (two) times daily.   polyethylene glycol (MIRALAX / GLYCOLAX) 17 g packet Take 8.5 g by mouth daily as needed (constipation).   Probiotic Product (ALIGN) 4 MG CAPS Take 4 mg by mouth at bedtime.   rOPINIRole (REQUIP) 0.5 MG tablet Take 0.5 mg by mouth as needed.   sertraline (ZOLOFT) 50 MG tablet Take 50 mg by mouth daily.   simvastatin (ZOCOR) 20 MG tablet Take 20 mg by mouth daily.   Sodium Fluoride (PREVIDENT 5000 BOOSTER PLUS) 1.1 % PSTE Place 1 Application onto teeth See admin instructions. Brush on teeth with a toothbrush after evening mouth care. Spit  out excess and do not rinse.   umeclidinium-vilanterol (ANORO ELLIPTA) 62.5-25 MCG/ACT AEPB Inhale 1 puff into the lungs every morning.   zinc oxide 20 % ointment Apply 1 Application topically See admin instructions. Apply topically to buttocks after every incontinent episode and as needed for redness   No facility-administered encounter medications on file as of 12/03/2022.    Review of Systems  Constitutional:  Negative for appetite change, chills, fatigue and fever.  HENT:  Negative for congestion, hearing loss, rhinorrhea and sore throat.   Eyes:  Positive for discharge and redness. Negative for pain, itching and visual disturbance.  Respiratory:  Negative for cough, shortness of breath and wheezing.   Cardiovascular:  Negative for chest pain, palpitations and leg swelling.  Gastrointestinal:  Negative for abdominal pain, constipation, diarrhea, nausea and vomiting.  Genitourinary:  Negative for dysuria.  Musculoskeletal:  Negative for arthralgias, back pain and myalgias.  Skin:  Negative for color change, rash and wound.  Neurological:  Negative for dizziness, weakness and headaches.   Psychiatric/Behavioral:  Negative for behavioral problems. The patient is not nervous/anxious.        Immunization History  Administered Date(s) Administered   Fluad Quad(high Dose 65+) 04/24/2022   Influenza, High Dose Seasonal PF 03/14/2018   PFIZER(Purple Top)SARS-COV-2 Vaccination 07/25/2019, 08/16/2019, 12/12/2021   Pneumococcal-Unspecified 03/02/2011   Tdap 03/12/2018   Zoster Recombinat (Shingrix) 10/08/2017, 12/09/2017   Pertinent  Health Maintenance Due  Topic Date Due   DEXA SCAN  Never done   INFLUENZA VACCINE  01/30/2023      05/06/2022    9:11 PM 05/07/2022   12:25 PM 05/17/2022   10:19 AM 07/12/2022   11:08 AM 10/30/2022   12:44 PM  Fall Risk  Falls in the past year?   1 0 0  Was there an injury with Fall?   1 0 0  Fall Risk Category Calculator   2 0 0  Fall Risk Category (Retired)   Moderate Low   (RETIRED) Patient Fall Risk Level Moderate fall risk Moderate fall risk Moderate fall risk Low fall risk   Patient at Risk for Falls Due to   History of fall(s) History of fall(s) History of fall(s);Impaired balance/gait;Impaired mobility  Fall risk Follow up   Falls evaluation completed Falls evaluation completed Falls evaluation completed;Education provided;Falls prevention discussed     Vitals:   12/03/22 1151  BP: 126/69  Pulse: 85  Resp: (!) 22  Temp: 98.2 F (36.8 C)  SpO2: 95%  Weight: 183 lb (83 kg)  Height: 5\' 1"  (1.549 m)   Body mass index is 34.58 kg/m.  Physical Exam Constitutional:      General: She is not in acute distress.    Appearance: She is obese.  HENT:     Head: Normocephalic and atraumatic.     Nose: Nose normal.     Mouth/Throat:     Mouth: Mucous membranes are moist.  Eyes:     Conjunctiva/sclera: Conjunctivae normal.     Comments: Bilateral eyes sclerae and conjunctivas are red  Cardiovascular:     Rate and Rhythm: Normal rate and regular rhythm.  Pulmonary:     Effort: Pulmonary effort is normal.     Breath sounds:  Normal breath sounds.  Abdominal:     General: Bowel sounds are normal.     Palpations: Abdomen is soft.  Musculoskeletal:        General: Normal range of motion.     Cervical back: Normal range of motion.  Skin:    General: Skin is warm and dry.  Neurological:     Mental Status: She is alert and oriented to person, place, and time. Mental status is at baseline.  Psychiatric:        Mood and Affect: Mood normal.        Behavior: Behavior normal.        Labs reviewed: Recent Labs    01/13/22 0035 01/14/22 0309 05/02/22 1839 05/04/22 0752 05/05/22 0008 05/05/22 0631 05/06/22 0655 10/08/22 0000  NA 141 141   < > 140  --  137 140 140  K 3.7 3.8   < > 3.2*  --  3.4* 4.0 4.5  CL 110 110   < > 113*  --  111 110 106  CO2 23 26   < > 22  --  24 27 29*  GLUCOSE 123* 125*   < > 108*  --  119* 114*  --   BUN 10 11   < > 20  --  9 5* 11  CREATININE 0.59 0.58   < > 0.34*  --  0.46 0.43* 0.5  CALCIUM 8.5* 8.5*   < > 7.3*  --  7.5* 7.8* 9.2  MG 1.9 2.1  --   --  1.9  --   --   --    < > = values in this interval not displayed.   Recent Labs    01/08/22 0500 01/11/22 2146 05/02/22 1839 10/08/22 0000  AST 16 23 18 15   ALT 13 15 14 10   ALKPHOS 36* 32* 46 50  BILITOT 0.4 0.4 0.4  --   PROT 5.0* 4.5* 6.1*  --   ALBUMIN 3.0* 2.6* 3.5 4.3   Recent Labs    05/04/22 1532 05/05/22 0008 05/05/22 0631 05/05/22 1056 05/06/22 0655 05/06/22 0656 05/07/22 0954 06/13/22 0700 10/08/22 0000  WBC 6.7  --  6.9  --  5.4  --   --  4.3 6.9  NEUTROABS 3.9  --  4.5  --  2.9  --   --  2,103.00 4,175.00  HGB 7.3*   < > 7.9*   < > 7.4*   < > 8.0* 11.3* 12.3  HCT 22.0*   < > 24.8*   < > 23.2*   < > 24.9* 35* 37  MCV 94.8  --  94.7  --  95.1  --   --   --   --   PLT 114*  --  107*  --  111*  --   --  160 165   < > = values in this interval not displayed.   Lab Results  Component Value Date   TSH 2.47 10/08/2022   No results found for: "HGBA1C" Lab Results  Component Value Date   CHOL  147 02/18/2022   HDL 51 02/18/2022   LDLCALC 72 02/18/2022   TRIG 162 (A) 02/18/2022    Significant Diagnostic Results in last 30 days:  No results found.  Assessment/Plan  1. Allergic conjunctivitis of both eyes -  will start on Olapatadine ophthalmic solution 1% instill 1 gtt to both eyes BID X 2 weeks  2. Seasonal allergies -  continue Zyrtec  3. COPD GOLD II  -  no wheezing nor SOB -  continue Anoro Ellipta    Family/ staff Communication: Discussed plan of care with resident and charge nurse.  Labs/tests ordered: None    Kenard Gower, DNP, MSN, FNP-BC [facility] [phone] (  Monday-Friday 8:00 a.m. - 5:00 p.m.) [phone] (after hours)

## 2022-12-09 ENCOUNTER — Encounter: Payer: Self-pay | Admitting: Orthopedic Surgery

## 2022-12-09 ENCOUNTER — Non-Acute Institutional Stay: Payer: Medicare HMO | Admitting: Orthopedic Surgery

## 2022-12-09 DIAGNOSIS — F339 Major depressive disorder, recurrent, unspecified: Secondary | ICD-10-CM | POA: Diagnosis not present

## 2022-12-09 DIAGNOSIS — K625 Hemorrhage of anus and rectum: Secondary | ICD-10-CM

## 2022-12-09 NOTE — Progress Notes (Signed)
Location:  [facility] Room Number: 20/A Place of Service:  ALF [phone]) Provider:  Octavia Heir, NP   Octavia Heir, NP  Patient Care Team: Octavia Heir, NP as PCP - General (Adult Health Nurse Practitioner) [name], MD as Consulting Physician (Internal Medicine)  Extended Emergency Contact Information Primary Emergency Contact: Pangallo,[name] Address: [address]          [place], [place] [address] [name] of [place] Home Phone: [phone] Mobile Phone: [phone] Relation: Spouse Secondary Emergency Contact: Yocelyn, Corban Mobile Phone: [phone] Relation: Son Interpreter needed? No  Code Status:  DNR Goals of care: Advanced Directive information    12/03/2022   11:48 AM  Advanced Directives  Does Patient Have a Medical Advance Directive? No  Would patient like information on creating a medical advance directive? No - Patient declined     Chief Complaint  Patient presents with   Acute Visit    Increased crying    HPI:  Pt is a 82 y.o. female seen today for acute visit due to increased crying.   She currently resides on the assisted living unit at [facility]. PMH: HTN, COPD, pulmonary nodule, Diverticulosis with GI bleed, GERD, OA, osteoporosis,macular degeneration, iron def anemia, unstable gait, anxiety, and depression.    Increased periods of crying in evening per nursing. Behaviors began after her birthday last month. She also reports sleeping more and less motivation to leave room. Increased sadness/crying related to progressive macular degeneration. Her vision has declined making it difficult for her to enjoy reading or crocheting. She has a magnifying glass, but does not think it helps. She has also tried listening to audio books without success. She reports follow up with Dr. Emily Filbert in future. She would like a new pair of glasses. PHQ score 7 today. She is on Zoloft 50 mg daily. Denies plan to harm self or increased  anxiety.   H/o diverticular bleed 11/2020 s/p embolization with recurrence 05/2021. Hospitalized 05/2022 due to ongoing rectal bleeding. 04/23 rectal bleeding returned, hgb 8.9 (04/29)> was 10.5 (04/23)> was 12.3 (04/08). No recent episodes on rectal bleeding. She is on Protonix.    Past Medical History:  Diagnosis Date   Anxiety    Arthritis    "bilateral knees, shoulders, elbows; neck, pretty widespread" (09/05/2017)   BPPV (benign paroxysmal positional vertigo)    Depression    GERD (gastroesophageal reflux disease)    Glaucoma, both eyes    Headache    "probably 2/month" (09/05/2017)   History of blood transfusion ~ 2008   "related to LGIB"   Hyperlipemia    Lower GI bleeding ~ 2008; 09/05/2017   "had to have blood transfusion"   Macular degeneration, bilateral    Osteopenia    Seasonal allergies    Skin cancer, basal cell 2001   "off my nose, left side"   Sleeping excessive    Tinnitus of both ears    Past Surgical History:  Procedure Laterality Date   BALLOON DILATION N/A 05/08/2021   Procedure: BALLOON DILATION;  Surgeon: Kerin Salen, MD;  Location: [facility] ENDOSCOPY;  Service: Gastroenterology;  Laterality: N/A;   BASAL CELL CARCINOMA EXCISION  2001   "off my nose, left side"   BIOPSY  05/08/2021   Procedure: BIOPSY;  Surgeon: Kerin Salen, MD;  Location: [facility] ENDOSCOPY;  Service: Gastroenterology;;   BLEPHAROPLASTY Bilateral    CATARACT EXTRACTION W/ INTRAOCULAR LENS  IMPLANT, BILATERAL Bilateral 1990's   COLONOSCOPY WITH PROPOFOL  N/A 05/04/2022   Procedure: COLONOSCOPY WITH PROPOFOL;  Surgeon: Kerin Salen, MD;  Location: [facility] ENDOSCOPY;  Service: Gastroenterology;  Laterality: N/A;   ESOPHAGOGASTRODUODENOSCOPY (EGD) WITH PROPOFOL N/A 05/08/2021   Procedure: ESOPHAGOGASTRODUODENOSCOPY (EGD) WITH PROPOFOL;  Surgeon: Kerin Salen, MD;  Location: [facility] ENDOSCOPY;  Service: Gastroenterology;  Laterality: N/A;   ESOPHAGOGASTRODUODENOSCOPY (EGD) WITH PROPOFOL N/A 01/11/2022   Procedure:  ESOPHAGOGASTRODUODENOSCOPY (EGD) WITH PROPOFOL;  Surgeon: Vida Rigger, MD;  Location: [facility] ENDOSCOPY;  Service: Gastroenterology;  Laterality: N/A;   EYE SURGERY Bilateral    "to improve vision after cataract OR"   IR ANGIOGRAM FOLLOW UP STUDY  12/19/2020   IR ANGIOGRAM SELECTIVE EACH ADDITIONAL VESSEL  12/19/2020   IR ANGIOGRAM SELECTIVE EACH ADDITIONAL VESSEL  12/19/2020   IR ANGIOGRAM VISCERAL SELECTIVE  12/19/2020   IR EMBO ART  VEN HEMORR LYMPH EXTRAV  INC GUIDE ROADMAPPING  12/19/2020   IR US GUIDE VASC ACCESS RIGHT  12/19/2020   JOINT REPLACEMENT     POLYPECTOMY  05/04/2022   Procedure: POLYPECTOMY;  Surgeon: Kerin Salen, MD;  Location: [facility] ENDOSCOPY;  Service: Gastroenterology;;   STAPEDES SURGERY Left    "scraped stapedes because it was sticking when it wasn't suppose to"   TONSILLECTOMY AND ADENOIDECTOMY  1946   TOTAL KNEE ARTHROPLASTY Left ~ 2008   TOTAL KNEE ARTHROPLASTY  12/20/2011   Procedure: TOTAL KNEE ARTHROPLASTY;  Surgeon: Verlee Rossetti, MD;  Location: [facility] OR;  Service: Orthopedics;  Laterality: Right;  Right Total Knee Arthroplasty   TUBAL LIGATION  1980's    Allergies  Allergen Reactions   Morphine And Codeine Itching   Aspirin Other (See Comments)    Unknown reaction   Duloxetine Hcl Other (See Comments)    Unknown reaction   Tymlos [Abaloparatide] Other (See Comments)    Unknown reaction   Ventolin [Albuterol] Other (See Comments)    rapid heart beat   Cefadroxil Hives    Patient can take amoxicillin and cipro    Outpatient Encounter Medications as of 12/09/2022  Medication Sig   acetaminophen (TYLENOL) 650 MG CR tablet Take 650 mg by mouth in the morning and at bedtime.   albuterol (VENTOLIN HFA) 108 (90 Base) MCG/ACT inhaler Inhale 2 puffs into the lungs every 4 (four) hours as needed for wheezing or shortness of breath.   alendronate (FOSAMAX) 70 MG tablet Take 70 mg by mouth every Sunday. Take with a full glass of water on an empty stomach.   Ascorbic Acid  (VITAMIN C) 1000 MG tablet Take 1,000 mg by mouth every morning.   cetirizine (ZYRTEC) 10 MG tablet Take 1 tablet (10 mg total) by mouth at bedtime.   Cholecalciferol (VITAMIN D3) 50 MCG (2000 UT) TABS Take 2,000 Units by mouth every morning.   docusate sodium (COLACE) 100 MG capsule Take 200 mg by mouth at bedtime.   dorzolamide-timolol (COSOPT) 22.3-6.8 MG/ML ophthalmic solution Place 1 drop into both eyes 2 (two) times daily.   ferrous gluconate (FERGON) 240 (27 FE) MG tablet Take 240 mg by mouth every morning. Take without food   ipratropium-albuterol (DUONEB) 0.5-2.5 (3) MG/3ML SOLN Take 3 mLs by nebulization as needed.   latanoprost (XALATAN) 0.005 % ophthalmic solution Place 1 drop into both eyes at bedtime.   METOPROLOL TARTRATE PO Take 6.25 mg by mouth in the morning and at bedtime.   Multiple Vitamins-Minerals (PRESERVISION AREDS 2) CAPS Take 1 capsule by mouth 2 (two) times daily.   Netarsudil Dimesylate (RHOPRESSA) 0.02 % SOLN Place 1 drop into  both eyes at bedtime.   oxybutynin (DITROPAN) 5 MG tablet Take 5 mg by mouth daily.   pantoprazole (PROTONIX) 40 MG tablet Take 40 mg by mouth 2 (two) times daily.   polyethylene glycol (MIRALAX / GLYCOLAX) 17 g packet Take 8.5 g by mouth daily as needed (constipation).   Probiotic Product (ALIGN) 4 MG CAPS Take 4 mg by mouth at bedtime.   rOPINIRole (REQUIP) 0.5 MG tablet Take 0.5 mg by mouth as needed.   sertraline (ZOLOFT) 50 MG tablet Take 50 mg by mouth daily.   simvastatin (ZOCOR) 20 MG tablet Take 20 mg by mouth daily.   Sodium Fluoride (PREVIDENT 5000 BOOSTER PLUS) 1.1 % PSTE Place 1 Application onto teeth See admin instructions. Brush on teeth with a toothbrush after evening mouth care. Spit out excess and do not rinse.   umeclidinium-vilanterol (ANORO ELLIPTA) 62.5-25 MCG/ACT AEPB Inhale 1 puff into the lungs every morning.   zinc oxide 20 % ointment Apply 1 Application topically See admin instructions. Apply topically to buttocks  after every incontinent episode and as needed for redness   No facility-administered encounter medications on file as of 12/09/2022.    Review of Systems  Constitutional:  Positive for fatigue. Negative for activity change and appetite change.  HENT:  Negative for trouble swallowing.   Respiratory:  Negative for cough, shortness of breath and wheezing.   Cardiovascular:  Negative for chest pain and leg swelling.  Gastrointestinal:  Negative for abdominal distention, abdominal pain and blood in stool.  Genitourinary:  Negative for dysuria.  Musculoskeletal:  Positive for gait problem.  Neurological:  Positive for weakness. Negative for dizziness and light-headedness.  Psychiatric/Behavioral:  Positive for dysphoric mood. Negative for confusion and sleep disturbance. The patient is not nervous/anxious.     Immunization History  Administered Date(s) Administered   Fluad Quad(high Dose 65+) 04/24/2022   Influenza, High Dose Seasonal PF 03/14/2018   PFIZER(Purple Top)SARS-COV-2 Vaccination 07/25/2019, 08/16/2019, 12/12/2021   Pneumococcal-Unspecified 03/02/2011   Tdap 03/12/2018   Zoster Recombinat (Shingrix) 10/08/2017, 12/09/2017   Pertinent  Health Maintenance Due  Topic Date Due   DEXA SCAN  Never done   INFLUENZA VACCINE  01/30/2023      05/06/2022    9:11 PM 05/07/2022   12:25 PM 05/17/2022   10:19 AM 07/12/2022   11:08 AM 10/30/2022   12:44 PM  Fall Risk  Falls in the past year?   1 0 0  Was there an injury with Fall?   1 0 0  Fall Risk Category Calculator   2 0 0  Fall Risk Category (Retired)   Moderate Low   (RETIRED) Patient Fall Risk Level Moderate fall risk Moderate fall risk Moderate fall risk Low fall risk   Patient at Risk for Falls Due to   History of fall(s) History of fall(s) History of fall(s);Impaired balance/gait;Impaired mobility  Fall risk Follow up   Falls evaluation completed Falls evaluation completed Falls evaluation completed;Education provided;Falls  prevention discussed   Functional Status Survey:    Vitals:   12/09/22 0919  BP: 107/68  Pulse: 71  Resp: 18  Temp: (!) 97.5 F (36.4 C)  SpO2: 93%  Weight: 183 lb (83 kg)  Height: 5\' 1"  (1.549 m)   Body mass index is 34.58 kg/m. Physical Exam Vitals reviewed.  Constitutional:      General: She is not in acute distress.    Comments: Pale complexion  HENT:     Head: Normocephalic.  Eyes:  General:        Right eye: No discharge.        Left eye: No discharge.  Cardiovascular:     Rate and Rhythm: Normal rate and regular rhythm.     Pulses: Normal pulses.     Heart sounds: Normal heart sounds.  Pulmonary:     Effort: Pulmonary effort is normal. No respiratory distress.     Breath sounds: Normal breath sounds. No wheezing.  Abdominal:     General: Bowel sounds are normal.     Palpations: Abdomen is soft.  Musculoskeletal:     Cervical back: Neck supple.     Right lower leg: No edema.     Left lower leg: No edema.  Skin:    General: Skin is warm.     Capillary Refill: Capillary refill takes less than 2 seconds.  Neurological:     General: No focal deficit present.     Mental Status: She is alert.     Motor: Weakness present.     Gait: Gait abnormal.     Comments: walker  Psychiatric:        Mood and Affect: Mood normal.    Labs reviewed: Recent Labs    01/13/22 0035 01/14/22 0309 05/02/22 1839 05/04/22 0752 05/05/22 0008 05/05/22 0631 05/06/22 0655 10/08/22 0000  NA 141 141   < > 140  --  137 140 140  K 3.7 3.8   < > 3.2*  --  3.4* 4.0 4.5  CL 110 110   < > 113*  --  111 110 106  CO2 23 26   < > 22  --  24 27 29*  GLUCOSE 123* 125*   < > 108*  --  119* 114*  --   BUN 10 11   < > 20  --  9 5* 11  CREATININE 0.59 0.58   < > 0.34*  --  0.46 0.43* 0.5  CALCIUM 8.5* 8.5*   < > 7.3*  --  7.5* 7.8* 9.2  MG 1.9 2.1  --   --  1.9  --   --   --    < > = values in this interval not displayed.   Recent Labs    01/08/22 0500 01/11/22 2146  05/02/22 1839 10/08/22 0000  AST 16 23 18 15   ALT 13 15 14 10   ALKPHOS 36* 32* 46 50  BILITOT 0.4 0.4 0.4  --   PROT 5.0* 4.5* 6.1*  --   ALBUMIN 3.0* 2.6* 3.5 4.3   Recent Labs    05/04/22 1532 05/05/22 0008 05/05/22 0631 05/05/22 1056 05/06/22 0655 05/06/22 0656 05/07/22 0954 06/13/22 0700 10/08/22 0000  WBC 6.7  --  6.9  --  5.4  --   --  4.3 6.9  NEUTROABS 3.9  --  4.5  --  2.9  --   --  2,103.00 4,175.00  HGB 7.3*   < > 7.9*   < > 7.4*   < > 8.0* 11.3* 12.3  HCT 22.0*   < > 24.8*   < > 23.2*   < > 24.9* 35* 37  MCV 94.8  --  94.7  --  95.1  --   --   --   --   PLT 114*  --  107*  --  111*  --   --  160 165   < > = values in this interval not displayed.   Lab Results  Component Value Date  TSH 2.47 10/08/2022   No results found for: "HGBA1C" Lab Results  Component Value Date   CHOL 147 02/18/2022   HDL 51 02/18/2022   LDLCALC 72 02/18/2022   TRIG 162 (A) 02/18/2022    Significant Diagnostic Results in last 30 days:  No results found.  Assessment/Plan 1. Recurrent depression (HCC) - increased crying, hypersomnolence and lack of motivation since 05/16 - related to poor vision and getting older - PHQ score 7 today - denies plan to harm self - increase zoloft to 75 mg po daily - bmp in 2 weeks  - consider talk therapy with [facility] in future  2. Rectal bleeding - last episode 04/23> resolved on own - H/o diverticular bleed 11/2020 s/p embolization with recurrence 05/2022 - hospitalized 05/2022 due to ongoing rectal bleeding - no recent episodes  - cont Protonix - cbc/diff in 2 weeks   Family/ staff Communication: plan discussed with patient and nurse  Labs/tests ordered: cbc/diff, bmp in 2 weeks

## 2022-12-10 ENCOUNTER — Non-Acute Institutional Stay: Payer: Medicare HMO | Admitting: Adult Health

## 2022-12-10 ENCOUNTER — Encounter: Payer: Self-pay | Admitting: Adult Health

## 2022-12-10 DIAGNOSIS — F339 Major depressive disorder, recurrent, unspecified: Secondary | ICD-10-CM | POA: Diagnosis not present

## 2022-12-10 NOTE — Progress Notes (Signed)
Location:  [facility] Room Number: 20 A Place of Service:  ALF [phone]) Provider:  Kenard Gower, DNP, FNP-BC  Patient Care Team: Octavia Heir, NP as PCP - General (Adult Health Nurse Practitioner) [name], MD as Consulting Physician (Internal Medicine)  Extended Emergency Contact Information Primary Emergency Contact: Garza,[name] Address: [address]          [place], [place] [address] [name] of [place] Home Phone: [phone] Mobile Phone: [phone] Relation: Spouse Secondary Emergency Contact: Ethelee, Gagel Mobile Phone: [phone] Relation: Son Interpreter needed? No  Code Status:  Full Code  Goals of care: Advanced Directive information    12/10/2022    9:31 AM  Advanced Directives  Does Patient Have a Medical Advance Directive? No  Would patient like information on creating a medical advance directive? No - Patient declined     Chief Complaint  Patient presents with   Acute Visit    Concern about "sleeping so much"    HPI:  Pt is a 82 y.o. female seen today for an acute visit for concerns about "sleeping too much".  She is a resident of [facility] [address] [facility]. She was taking Sertraline 100 mg daily for depression and was decreased to 50 mg daily due to being sleepy. However, patient was noted to be crying a lot so husband requested to increase dosage of Sertraline and was increased to 75 mg daily.  She was seen today and denies being sleepy. She was seen walking in the hallway with her walker.   Past Medical History:  Diagnosis Date   Anxiety    Arthritis    "bilateral knees, shoulders, elbows; neck, pretty widespread" (09/05/2017)   BPPV (benign paroxysmal positional vertigo)    Depression    GERD (gastroesophageal reflux disease)    Glaucoma, both eyes    Headache    "probably 2/month" (09/05/2017)   History of blood transfusion ~ 2008   "related to LGIB"   Hyperlipemia    Lower GI bleeding ~ 2008;  09/05/2017   "had to have blood transfusion"   Macular degeneration, bilateral    Osteopenia    Seasonal allergies    Skin cancer, basal cell 2001   "off my nose, left side"   Sleeping excessive    Tinnitus of both ears    Past Surgical History:  Procedure Laterality Date   BALLOON DILATION N/A 05/08/2021   Procedure: BALLOON DILATION;  Surgeon: Kerin Salen, MD;  Location[facility] ENDOSCOPY;  Service: Gastroenterology;  Laterality: N/A;   BASAL CELL CARCINOMA EXCISION  2001   "off my nose, left side"   BIOPSY  05/08/2021   Procedure: BIOPSY;  Surgeon: Kerin Salen, MD;  Location: [facility] ENDOSCOPY;  Service: Gastroenterology;;   BLEPHAROPLASTY Bilateral    CATARACT EXTRACTION W/ INTRAOCULAR LENS  IMPLANT, BILATERAL Bilateral 1990's   COLONOSCOPY WITH PROPOFOL N/A 05/04/2022   Procedure: COLONOSCOPY WITH PROPOFOL;  Surgeon: Kerin Salen, MD;  Location: [facility] ENDOSCOPY;  Service: Gastroenterology;  Laterality: N/A;   ESOPHAGOGASTRODUODENOSCOPY (EGD) WITH PROPOFOL N/A 05/08/2021   Procedure: ESOPHAGOGASTRODUODENOSCOPY (EGD) WITH PROPOFOL;  Surgeon: Kerin Salen, MD;  Location: [facility] ENDOSCOPY;  Service: Gastroenterology;  Laterality: N/A;   ESOPHAGOGASTRODUODENOSCOPY (EGD) WITH PROPOFOL N/A 01/11/2022   Procedure: ESOPHAGOGASTRODUODENOSCOPY (EGD) WITH PROPOFOL;  Surgeon: Vida Rigger, MD;  Location: [facility] ENDOSCOPY;  Service: Gastroenterology;  Laterality: N/A;   EYE SURGERY Bilateral    "to improve vision after cataract OR"   IR ANGIOGRAM FOLLOW UP STUDY  12/19/2020  IR ANGIOGRAM SELECTIVE EACH ADDITIONAL VESSEL  12/19/2020   IR ANGIOGRAM SELECTIVE EACH ADDITIONAL VESSEL  12/19/2020   IR ANGIOGRAM VISCERAL SELECTIVE  12/19/2020   IR EMBO ART  VEN HEMORR LYMPH EXTRAV  INC GUIDE ROADMAPPING  12/19/2020   IR US GUIDE VASC ACCESS RIGHT  12/19/2020   JOINT REPLACEMENT     POLYPECTOMY  05/04/2022   Procedure: POLYPECTOMY;  Surgeon: Kerin Salen, MD;  Location: [facility] ENDOSCOPY;  Service: Gastroenterology;;   STAPEDES SURGERY  Left    "scraped stapedes because it was sticking when it wasn't suppose to"   TONSILLECTOMY AND ADENOIDECTOMY  1946   TOTAL KNEE ARTHROPLASTY Left ~ 2008   TOTAL KNEE ARTHROPLASTY  12/20/2011   Procedure: TOTAL KNEE ARTHROPLASTY;  Surgeon: Verlee Rossetti, MD;  Location: [facility] OR;  Service: Orthopedics;  Laterality: Right;  Right Total Knee Arthroplasty   TUBAL LIGATION  1980's    Allergies  Allergen Reactions   Morphine And Codeine Itching   Aspirin Other (See Comments)    Unknown reaction   Duloxetine Hcl Other (See Comments)    Unknown reaction   Tymlos [Abaloparatide] Other (See Comments)    Unknown reaction   Ventolin [Albuterol] Other (See Comments)    rapid heart beat   Cefadroxil Hives    Patient can take amoxicillin and cipro    Outpatient Encounter Medications as of 12/10/2022  Medication Sig   acetaminophen (TYLENOL) 650 MG CR tablet Take 650 mg by mouth in the morning and at bedtime.   albuterol (VENTOLIN HFA) 108 (90 Base) MCG/ACT inhaler Inhale 2 puffs into the lungs every 4 (four) hours as needed for wheezing or shortness of breath.   alendronate (FOSAMAX) 70 MG tablet Take 70 mg by mouth every Sunday. Take with a full glass of water on an empty stomach.   Ascorbic Acid (VITAMIN C) 1000 MG tablet Take 1,000 mg by mouth every morning.   cetirizine (ZYRTEC) 10 MG tablet Take 1 tablet (10 mg total) by mouth at bedtime.   Cholecalciferol (VITAMIN D3) 50 MCG (2000 UT) TABS Take 2,000 Units by mouth every morning.   docusate sodium (COLACE) 100 MG capsule Take 200 mg by mouth at bedtime.   dorzolamide-timolol (COSOPT) 22.3-6.8 MG/ML ophthalmic solution Place 1 drop into both eyes 2 (two) times daily.   ferrous gluconate (FERGON) 240 (27 FE) MG tablet Take 240 mg by mouth every morning. Take without food   ipratropium-albuterol (DUONEB) 0.5-2.5 (3) MG/3ML SOLN Take 3 mLs by nebulization as needed.   latanoprost (XALATAN) 0.005 % ophthalmic solution Place 1 drop into both eyes  at bedtime.   METOPROLOL TARTRATE PO Take 6.25 mg by mouth in the morning and at bedtime.   Multiple Vitamins-Minerals (PRESERVISION AREDS 2) CAPS Take 1 capsule by mouth 2 (two) times daily.   Netarsudil Dimesylate (RHOPRESSA) 0.02 % SOLN Place 1 drop into both eyes at bedtime.   olopatadine (PATANOL) 0.1 % ophthalmic solution Place 1 drop into both eyes 2 (two) times daily.   oxybutynin (DITROPAN) 5 MG tablet Take 5 mg by mouth daily.   pantoprazole (PROTONIX) 40 MG tablet Take 40 mg by mouth 2 (two) times daily.   polyethylene glycol (MIRALAX / GLYCOLAX) 17 g packet Take 8.5 g by mouth daily as needed (constipation).   Probiotic Product (ALIGN) 4 MG CAPS Take 4 mg by mouth at bedtime.   rOPINIRole (REQUIP) 0.5 MG tablet Take 0.5 mg by mouth as needed.   sertraline (ZOLOFT) 50 MG tablet Take 75  mg by mouth daily.   simvastatin (ZOCOR) 20 MG tablet Take 20 mg by mouth daily.   Sodium Fluoride (PREVIDENT 5000 BOOSTER PLUS) 1.1 % PSTE Place 1 Application onto teeth See admin instructions. Brush on teeth with a toothbrush after evening mouth care. Spit out excess and do not rinse.   umeclidinium-vilanterol (ANORO ELLIPTA) 62.5-25 MCG/ACT AEPB Inhale 1 puff into the lungs every morning.   zinc oxide 20 % ointment Apply 1 Application topically See admin instructions. Apply topically to buttocks after every incontinent episode and as needed for redness   No facility-administered encounter medications on file as of 12/10/2022.    Review of Systems  Constitutional:  Negative for appetite change, chills, fatigue and fever.  HENT:  Negative for congestion, hearing loss, rhinorrhea and sore throat.   Eyes: Negative.   Respiratory:  Negative for cough, shortness of breath and wheezing.   Cardiovascular:  Negative for chest pain, palpitations and leg swelling.  Gastrointestinal:  Negative for abdominal pain, constipation, diarrhea, nausea and vomiting.  Genitourinary:  Negative for dysuria.   Musculoskeletal:  Negative for arthralgias, back pain and myalgias.  Skin:  Negative for color change, rash and wound.  Neurological:  Negative for dizziness, weakness and headaches.  Psychiatric/Behavioral:  Negative for behavioral problems. The patient is not nervous/anxious.        Immunization History  Administered Date(s) Administered   Fluad Quad(high Dose 65+) 04/24/2022   Influenza, High Dose Seasonal PF 03/14/2018   PFIZER(Purple Top)SARS-COV-2 Vaccination 07/25/2019, 08/16/2019, 12/12/2021   Pneumococcal-Unspecified 03/02/2011   Tdap 03/12/2018   Zoster Recombinat (Shingrix) 10/08/2017, 12/09/2017   Pertinent  Health Maintenance Due  Topic Date Due   DEXA SCAN  Never done   INFLUENZA VACCINE  01/30/2023      05/06/2022    9:11 PM 05/07/2022   12:25 PM 05/17/2022   10:19 AM 07/12/2022   11:08 AM 10/30/2022   12:44 PM  Fall Risk  Falls in the past year?   1 0 0  Was there an injury with Fall?   1 0 0  Fall Risk Category Calculator   2 0 0  Fall Risk Category (Retired)   Moderate Low   (RETIRED) Patient Fall Risk Level Moderate fall risk Moderate fall risk Moderate fall risk Low fall risk   Patient at Risk for Falls Due to   History of fall(s) History of fall(s) History of fall(s);Impaired balance/gait;Impaired mobility  Fall risk Follow up   Falls evaluation completed Falls evaluation completed Falls evaluation completed;Education provided;Falls prevention discussed     Vitals:   12/10/22 0932  BP: 107/68  Pulse: 71  Resp: 18  Temp: (!) 97.5 F (36.4 C)  SpO2: 93%  Weight: 183 lb (83 kg)  Height: 5\' 1"  (1.549 m)   Body mass index is 34.58 kg/m.  Physical Exam Constitutional:      General: She is not in acute distress.    Appearance: She is obese.  HENT:     Head: Normocephalic and atraumatic.     Nose: Nose normal.     Mouth/Throat:     Mouth: Mucous membranes are moist.  Eyes:     Conjunctiva/sclera: Conjunctivae normal.  Cardiovascular:      Rate and Rhythm: Normal rate and regular rhythm.  Pulmonary:     Effort: Pulmonary effort is normal.     Breath sounds: Normal breath sounds.  Abdominal:     General: Bowel sounds are normal.     Palpations: Abdomen is soft.  Musculoskeletal:        General: Normal range of motion.     Cervical back: Normal range of motion.  Skin:    General: Skin is warm and dry.  Neurological:     General: No focal deficit present.     Mental Status: She is alert.  Psychiatric:        Mood and Affect: Mood normal.        Behavior: Behavior normal.        Thought Content: Thought content normal.        Judgment: Judgment normal.        Labs reviewed: Recent Labs    01/13/22 0035 01/14/22 0309 05/02/22 1839 05/04/22 0752 05/05/22 0008 05/05/22 0631 05/06/22 0655 10/08/22 0000  NA 141 141   < > 140  --  137 140 140  K 3.7 3.8   < > 3.2*  --  3.4* 4.0 4.5  CL 110 110   < > 113*  --  111 110 106  CO2 23 26   < > 22  --  24 27 29*  GLUCOSE 123* 125*   < > 108*  --  119* 114*  --   BUN 10 11   < > 20  --  9 5* 11  CREATININE 0.59 0.58   < > 0.34*  --  0.46 0.43* 0.5  CALCIUM 8.5* 8.5*   < > 7.3*  --  7.5* 7.8* 9.2  MG 1.9 2.1  --   --  1.9  --   --   --    < > = values in this interval not displayed.   Recent Labs    01/08/22 0500 01/11/22 2146 05/02/22 1839 10/08/22 0000  AST 16 23 18 15   ALT 13 15 14 10   ALKPHOS 36* 32* 46 50  BILITOT 0.4 0.4 0.4  --   PROT 5.0* 4.5* 6.1*  --   ALBUMIN 3.0* 2.6* 3.5 4.3   Recent Labs    05/04/22 1532 05/05/22 0008 05/05/22 0631 05/05/22 1056 05/06/22 0655 05/06/22 0656 05/07/22 0954 06/13/22 0700 10/08/22 0000  WBC 6.7  --  6.9  --  5.4  --   --  4.3 6.9  NEUTROABS 3.9  --  4.5  --  2.9  --   --  2,103.00 4,175.00  HGB 7.3*   < > 7.9*   < > 7.4*   < > 8.0* 11.3* 12.3  HCT 22.0*   < > 24.8*   < > 23.2*   < > 24.9* 35* 37  MCV 94.8  --  94.7  --  95.1  --   --   --   --   PLT 114*  --  107*  --  111*  --   --  160 165   < > =  values in this interval not displayed.   Lab Results  Component Value Date   TSH 2.47 10/08/2022   No results found for: "HGBA1C" Lab Results  Component Value Date   CHOL 147 02/18/2022   HDL 51 02/18/2022   LDLCALC 72 02/18/2022   TRIG 162 (A) 02/18/2022    Significant Diagnostic Results in last 30 days:  No results found.  Assessment/Plan  1. Recurrent depression (HCC) -  no note somnolence -  will continue current dosage of Sertraline 75 mg daily    Family/ staff Communication: Discussed plan of care with resident and charge nurse.  Labs/tests ordered:  None  Durenda Age, DNP, MSN, FNP-BC [facility] [phone] (Monday-Friday 8:00 a.m. - 5:00 p.m.) [phone] (after hours)

## 2022-12-20 ENCOUNTER — Non-Acute Institutional Stay: Payer: Medicare HMO | Admitting: Orthopedic Surgery

## 2022-12-20 ENCOUNTER — Encounter: Payer: Self-pay | Admitting: Orthopedic Surgery

## 2022-12-20 DIAGNOSIS — F339 Major depressive disorder, recurrent, unspecified: Secondary | ICD-10-CM

## 2022-12-20 DIAGNOSIS — G629 Polyneuropathy, unspecified: Secondary | ICD-10-CM

## 2022-12-20 MED ORDER — GABAPENTIN 100 MG PO CAPS: 100.0000 mg | ORAL_CAPSULE | Freq: Every day | ORAL | 3 refills | Status: DC

## 2022-12-20 NOTE — Progress Notes (Signed)
Location:   [facility]  Nursing Home Room Number: AL-20 Place of Service:  ALF [phone]) Provider:  Hazle Nordmann, NP    Patient Care Team: Betty Heir, NP as PCP - General (Adult Health Nurse Practitioner) [name], MD as Consulting Physician (Internal Medicine)  Extended Emergency Contact Information Primary Emergency Contact: [name],Betty Address: [address]          [place], [place] [address] [name] Betty of [place] Home Phone: [phone] Mobile Phone: [phone] Relation: Spouse Secondary Emergency Contact: Betty, [name] Mobile Phone: [phone] Relation: Son Interpreter needed? No  Code Status:  FULL CODE Goals of care: Advanced Directive information    12/20/2022   10:17 AM  Advanced Directives  Does Patient Have a Medical Advance Directive? No  Would patient like information on creating a medical advance directive? No - Patient declined     Chief Complaint  Patient presents with   Acute Visit    Toe pain.    HPI:  Pt is a 82 y.o. female seen today for an acute visit due to toe/foot pain at night.   She currently resides on the assisted living unit at [facility]. PMH: HTN, COPD, pulmonary nodule, Diverticulosis with GI bleed, GERD, OA, osteoporosis,macular degeneration, iron def anemia, unstable gait, anxiety, and depression.   Increased foot and toe pain x 2 weeks. Pain described as "intermittent burning." Occurring more at night. She has had trouble sleeping last 2 nights due to burning. H/o RLS, she is taking ropinirole. She is also given tylenol arthritis 1300 mg BID. No recent injuries or falls. Afebrile. Vitals stable.   06/10 increased crying, increased sleepiness, and lack of motivation. PHQ score 7. Zoloft increased to 75 mg daily. She reports improved mood. PHQ score 6 today. BMP scheduled next week.   Past Medical History:  Diagnosis Date   Anxiety    Arthritis    "bilateral knees, shoulders, elbows; neck, pretty  widespread" (09/05/2017)   BPPV (benign paroxysmal positional vertigo)    Depression    GERD (gastroesophageal reflux disease)    Glaucoma, both eyes    Headache    "probably 2/month" (09/05/2017)   History of blood transfusion ~ 2008   "related to LGIB"   Hyperlipemia    Lower GI bleeding ~ 2008; 09/05/2017   "had to have blood transfusion"   Macular degeneration, bilateral    Osteopenia    Seasonal allergies    Skin cancer, basal cell 2001   "off my nose, left side"   Sleeping excessive    Tinnitus of both ears    Past Surgical History:  Procedure Laterality Date   BALLOON DILATION N/A 05/08/2021   Procedure: BALLOON DILATION;  Surgeon: Kerin Salen, MD;  Location: [facility] ENDOSCOPY;  Service: Gastroenterology;  Laterality: N/A;   BASAL CELL CARCINOMA EXCISION  2001   "off my nose, left side"   BIOPSY  05/08/2021   Procedure: BIOPSY;  Surgeon: Kerin Salen, MD;  Location: [facility] ENDOSCOPY;  Service: Gastroenterology;;   BLEPHAROPLASTY Bilateral    CATARACT EXTRACTION W/ INTRAOCULAR LENS  IMPLANT, BILATERAL Bilateral 1990's   COLONOSCOPY WITH PROPOFOL N/A 05/04/2022   Procedure: COLONOSCOPY WITH PROPOFOL;  Surgeon: Kerin Salen, MD;  Location: [facility] ENDOSCOPY;  Service: Gastroenterology;  Laterality: N/A;   ESOPHAGOGASTRODUODENOSCOPY (EGD) WITH PROPOFOL N/A 05/08/2021   Procedure: ESOPHAGOGASTRODUODENOSCOPY (EGD) WITH PROPOFOL;  Surgeon: Kerin Salen, MD;  Location: [facility] ENDOSCOPY;  Service: Gastroenterology;  Laterality: N/A;   ESOPHAGOGASTRODUODENOSCOPY (EGD) WITH PROPOFOL N/A 01/11/2022  Procedure: ESOPHAGOGASTRODUODENOSCOPY (EGD) WITH PROPOFOL;  Surgeon: Vida Rigger, MD;  Location: [facility] ENDOSCOPY;  Service: Gastroenterology;  Laterality: N/A;   EYE SURGERY Bilateral    "to improve vision after cataract OR"   IR ANGIOGRAM FOLLOW UP STUDY  12/19/2020   IR ANGIOGRAM SELECTIVE EACH ADDITIONAL VESSEL  12/19/2020   IR ANGIOGRAM SELECTIVE EACH ADDITIONAL VESSEL  12/19/2020   IR ANGIOGRAM VISCERAL SELECTIVE   12/19/2020   IR EMBO ART  VEN HEMORR LYMPH EXTRAV  INC GUIDE ROADMAPPING  12/19/2020   IR US GUIDE VASC ACCESS RIGHT  12/19/2020   JOINT REPLACEMENT     POLYPECTOMY  05/04/2022   Procedure: POLYPECTOMY;  Surgeon: Kerin Salen, MD;  Location: [facility] ENDOSCOPY;  Service: Gastroenterology;;   STAPEDES SURGERY Left    "scraped stapedes because it was sticking when it wasn't suppose to"   TONSILLECTOMY AND ADENOIDECTOMY  1946   TOTAL KNEE ARTHROPLASTY Left ~ 2008   TOTAL KNEE ARTHROPLASTY  12/20/2011   Procedure: TOTAL KNEE ARTHROPLASTY;  Surgeon: Verlee Rossetti, MD;  Location: [facility] OR;  Service: Orthopedics;  Laterality: Right;  Right Total Knee Arthroplasty   TUBAL LIGATION  1980's    Allergies  Allergen Reactions   Morphine And Codeine Itching   Aspirin Other (See Comments)    Unknown reaction   Duloxetine Hcl Other (See Comments)    Unknown reaction   Tymlos [Abaloparatide] Other (See Comments)    Unknown reaction   Ventolin [Albuterol] Other (See Comments)    rapid heart beat   Cefadroxil Hives    Patient can take amoxicillin and cipro    Allergies as of 12/20/2022       Reactions   Morphine And Codeine Itching   Aspirin Other (See Comments)   Unknown reaction   Duloxetine Hcl Other (See Comments)   Unknown reaction   Tymlos [abaloparatide] Other (See Comments)   Unknown reaction   Ventolin [albuterol] Other (See Comments)   rapid heart beat   Cefadroxil Hives   Patient can take amoxicillin and cipro        Medication List        Accurate as of December 20, 2022 10:18 AM. If you have any questions, ask your nurse or doctor.          STOP taking these medications    ipratropium-albuterol 0.5-2.5 (3) MG/3ML Soln Commonly known as: DUONEB Stopped by: Betty Heir, NP       TAKE these medications    acetaminophen 650 MG CR tablet Commonly known as: TYLENOL Take 650 mg by mouth in the morning and at bedtime.   albuterol 108 (90 Base) MCG/ACT inhaler Commonly known  as: VENTOLIN HFA Inhale 2 puffs into the lungs every 4 (four) hours as needed for wheezing or shortness of breath.   alendronate 70 MG tablet Commonly known as: FOSAMAX Take 70 mg by mouth every Sunday. Take with a full glass of water on an empty stomach.   Align 4 MG Caps Take 4 mg by mouth at bedtime.   Anoro Ellipta 62.5-25 MCG/ACT Aepb Generic drug: umeclidinium-vilanterol Inhale 1 puff into the lungs every morning.   cetirizine 10 MG tablet Commonly known as: ZYRTEC Take 1 tablet (10 mg total) by mouth at bedtime.   docusate sodium 100 MG capsule Commonly known as: COLACE Take 200 mg by mouth at bedtime.   dorzolamide-timolol 2-0.5 % ophthalmic solution Commonly known as: COSOPT Place 1 drop into both eyes 2 (two) times daily.   ferrous gluconate  240 (27 FE) MG tablet Commonly known as: FERGON Take 240 mg by mouth every morning. Take without food   latanoprost 0.005 % ophthalmic solution Commonly known as: XALATAN Place 1 drop into both eyes at bedtime.   METOPROLOL TARTRATE PO Take 6.25 mg by mouth in the morning and at bedtime.   oxybutynin 5 MG tablet Commonly known as: DITROPAN Take 5 mg by mouth daily.   pantoprazole 40 MG tablet Commonly known as: PROTONIX Take 40 mg by mouth 2 (two) times daily.   polyethylene glycol 17 g packet Commonly known as: MIRALAX / GLYCOLAX Take 8.5 g by mouth daily as needed (constipation).   PreserVision AREDS 2 Caps Take 1 capsule by mouth 2 (two) times daily.   PreviDent 5000 Booster Plus 1.1 % Pste Generic drug: Sodium Fluoride Place 1 Application onto teeth See admin instructions. Brush on teeth with a toothbrush after evening mouth care. Spit out excess and do not rinse.   Rhopressa 0.02 % Soln Generic drug: Netarsudil Dimesylate Place 1 drop into both eyes at bedtime.   rOPINIRole 0.5 MG tablet Commonly known as: REQUIP Take 0.5 mg by mouth as needed.   sertraline 50 MG tablet Commonly known as:  ZOLOFT Take 75 mg by mouth daily.   simvastatin 20 MG tablet Commonly known as: ZOCOR Take 20 mg by mouth daily.   vitamin C 1000 MG tablet Take 1,000 mg by mouth every morning.   Vitamin D3 50 MCG (2000 UT) Tabs Take 2,000 Units by mouth every morning.   zinc oxide 20 % ointment Apply 1 Application topically See admin instructions. Apply topically to buttocks after every incontinent episode and as needed for redness        Review of Systems  Constitutional:  Negative for activity change and appetite change.  HENT:  Negative for sore throat and trouble swallowing.   Eyes:  Negative for visual disturbance.  Respiratory:  Negative for cough, shortness of breath and wheezing.   Cardiovascular:  Positive for leg swelling. Negative for chest pain.  Gastrointestinal:  Negative for abdominal distention and abdominal pain.  Genitourinary:  Negative for dysuria.  Musculoskeletal:  Positive for gait problem and myalgias.  Skin:  Negative for wound.  Neurological:  Positive for weakness and numbness. Negative for dizziness and light-headedness.  Psychiatric/Behavioral:  Positive for dysphoric mood and sleep disturbance. Negative for confusion. The patient is not nervous/anxious.     Immunization History  Administered Date(s) Administered   Fluad Quad(high Dose 65+) 04/24/2022   Influenza, High Dose Seasonal PF 03/14/2018   PFIZER(Purple Top)SARS-COV-2 Vaccination 07/25/2019, 08/16/2019, 12/12/2021   Pneumococcal-Unspecified 03/02/2011   Tdap 03/12/2018   Zoster Recombinat (Shingrix) 10/08/2017, 12/09/2017   Pertinent  Health Maintenance Due  Topic Date Due   DEXA SCAN  Never done   INFLUENZA VACCINE  01/30/2023      05/06/2022    9:11 PM 05/07/2022   12:25 PM 05/17/2022   10:19 AM 07/12/2022   11:08 AM 10/30/2022   12:44 PM  Fall Risk  Falls in the past year?   1 0 0  Was there an injury with Fall?   1 0 0  Fall Risk Category Calculator   2 0 0  Fall Risk Category (Retired)    Moderate Low   (RETIRED) Patient Fall Risk Level Moderate fall risk Moderate fall risk Moderate fall risk Low fall risk   Patient at Risk for Falls Due to   History of fall(s) History of fall(s) History of fall(s);Impaired  balance/gait;Impaired mobility  Fall risk Follow up   Falls evaluation completed Falls evaluation completed Falls evaluation completed;Education provided;Falls prevention discussed   Functional Status Survey:    Vitals:   12/20/22 1012  BP: (!) 114/53  Pulse: 62  Resp: 18  Temp: 97.6 F (36.4 C)  SpO2: 92%  Weight: 183 lb (83 kg)  Height: 5\' 1"  (1.549 m)   Body mass index is 34.58 kg/m. Physical Exam Vitals reviewed.  Constitutional:      General: She is not in acute distress. HENT:     Head: Normocephalic.  Eyes:     General:        Right eye: No discharge.        Left eye: No discharge.  Cardiovascular:     Rate and Rhythm: Normal rate and regular rhythm.     Pulses:          Dorsalis pedis pulses are 1+ on the right side and 1+ on the left side.       Posterior tibial pulses are 1+ on the right side and 1+ on the left side.     Heart sounds: Normal heart sounds.  Pulmonary:     Effort: Pulmonary effort is normal. No respiratory distress.     Breath sounds: Normal breath sounds. No wheezing.  Abdominal:     General: Bowel sounds are normal.     Palpations: Abdomen is soft.  Musculoskeletal:     Cervical back: Neck supple.     Right lower leg: Edema present.     Left lower leg: Edema present.     Comments: Non pitting  Feet:     Right foot:     Protective Sensation: 8 sites tested.  10 sites sensed.     Skin integrity: Dry skin present.     Toenail Condition: Fungal disease present.    Left foot:     Protective Sensation:   10 sites sensed.     Skin integrity: Dry skin present.     Toenail Condition: Fungal disease present. Skin:    General: Skin is warm.     Capillary Refill: Capillary refill takes less than 2 seconds.  Neurological:      General: No focal deficit present.     Mental Status: She is alert and oriented to person, place, and time.     Motor: Weakness present.     Gait: Gait abnormal.     Comments: walker  Psychiatric:        Mood and Affect: Mood normal.     Labs reviewed: Recent Labs    01/13/22 0035 01/14/22 0309 05/02/22 1839 05/04/22 0752 05/05/22 0008 05/05/22 0631 05/06/22 0655 10/08/22 0000  NA 141 141   < > 140  --  137 140 140  K 3.7 3.8   < > 3.2*  --  3.4* 4.0 4.5  CL 110 110   < > 113*  --  111 110 106  CO2 23 26   < > 22  --  24 27 29*  GLUCOSE 123* 125*   < > 108*  --  119* 114*  --   BUN 10 11   < > 20  --  9 5* 11  CREATININE 0.59 0.58   < > 0.34*  --  0.46 0.43* 0.5  CALCIUM 8.5* 8.5*   < > 7.3*  --  7.5* 7.8* 9.2  MG 1.9 2.1  --   --  1.9  --   --   --    < > =  values in this interval not displayed.   Recent Labs    01/08/22 0500 01/11/22 2146 05/02/22 1839 10/08/22 0000  AST 16 23 18 15   ALT 13 15 14 10   ALKPHOS 36* 32* 46 50  BILITOT 0.4 0.4 0.4  --   PROT 5.0* 4.5* 6.1*  --   ALBUMIN 3.0* 2.6* 3.5 4.3   Recent Labs    05/04/22 1532 05/05/22 0008 05/05/22 0631 05/05/22 1056 05/06/22 0655 05/06/22 0656 06/13/22 0700 10/08/22 0000 11/04/22 0000  WBC 6.7  --  6.9  --  5.4  --  4.3 6.9 5.1  NEUTROABS 3.9  --  4.5  --  2.9  --  2,103.00 4,175.00 2,785.00  HGB 7.3*   < > 7.9*   < > 7.4*   < > 11.3* 12.3 10.0*  HCT 22.0*   < > 24.8*   < > 23.2*   < > 35* 37 31*  MCV 94.8  --  94.7  --  95.1  --   --   --   --   PLT 114*  --  107*  --  111*  --  160 165 190   < > = values in this interval not displayed.   Lab Results  Component Value Date   TSH 2.47 10/08/2022   No results found for: "HGBA1C" Lab Results  Component Value Date   CHOL 147 02/18/2022   HDL 51 02/18/2022   LDLCALC 72 02/18/2022   TRIG 162 (A) 02/18/2022    Significant Diagnostic Results in last 30 days:  No results found.  Assessment/Plan 1. Neuropathy - intermittent burning  to BLE x 1 week> worst last 2 nights - trail gabapentin 100 mg at bedtime - cont tylenol arthritis 1300 mg BID - gabapentin (NEURONTIN) 100 MG capsule; Take 1 capsule (100 mg total) by mouth at bedtime.  Dispense: 90 capsule; Refill: 3  2. Recurrent depression (HCC) - 06/10 PHQ score 7> now 6 - 06/10 zoloft increased to 75 mg daily - bmp scheduled next week   Family/ staff Communication: plan discussed with patient and nurse  Labs/tests ordered:  none

## 2022-12-23 DIAGNOSIS — D649 Anemia, unspecified: Secondary | ICD-10-CM | POA: Diagnosis not present

## 2022-12-23 DIAGNOSIS — I10 Essential (primary) hypertension: Secondary | ICD-10-CM | POA: Diagnosis not present

## 2022-12-23 LAB — BASIC METABOLIC PANEL
BUN: 14 (ref 4–21)
CO2: 31 — AB (ref 13–22)
Chloride: 105 (ref 99–108)
Creatinine: 0.6 (ref 0.5–1.1)
Glucose: 105
Potassium: 4.2 mEq/L (ref 3.5–5.1)
Sodium: 143 (ref 137–147)

## 2022-12-23 LAB — CBC AND DIFFERENTIAL
HCT: 37 (ref 36–46)
Hemoglobin: 12.1 (ref 12.0–16.0)
Platelets: 161 10*3/uL (ref 150–400)
WBC: 4.8

## 2022-12-23 LAB — CBC: RBC: 3.9 (ref 3.87–5.11)

## 2022-12-23 LAB — COMPREHENSIVE METABOLIC PANEL: Calcium: 9.2 (ref 8.7–10.7)

## 2023-01-05 ENCOUNTER — Encounter (HOSPITAL_COMMUNITY): Payer: Self-pay | Admitting: Emergency Medicine

## 2023-01-05 ENCOUNTER — Other Ambulatory Visit: Payer: Self-pay

## 2023-01-05 ENCOUNTER — Emergency Department (HOSPITAL_COMMUNITY): Payer: Medicare HMO

## 2023-01-05 ENCOUNTER — Inpatient Hospital Stay (HOSPITAL_COMMUNITY)
Admission: EM | Admit: 2023-01-05 | Discharge: 2023-01-07 | DRG: 378 | Disposition: A | Discharge status: Skilled Nursing Facility | Payer: Medicare HMO | Source: Skilled Nursing Facility | Attending: Family Medicine | Admitting: Family Medicine

## 2023-01-05 DIAGNOSIS — K2901 Acute gastritis with bleeding: Principal | ICD-10-CM | POA: Diagnosis present

## 2023-01-05 DIAGNOSIS — F419 Anxiety disorder, unspecified: Secondary | ICD-10-CM | POA: Diagnosis present

## 2023-01-05 DIAGNOSIS — Z96653 Presence of artificial knee joint, bilateral: Secondary | ICD-10-CM | POA: Diagnosis present

## 2023-01-05 DIAGNOSIS — D509 Iron deficiency anemia, unspecified: Secondary | ICD-10-CM | POA: Diagnosis present

## 2023-01-05 DIAGNOSIS — D62 Acute posthemorrhagic anemia: Secondary | ICD-10-CM | POA: Diagnosis not present

## 2023-01-05 DIAGNOSIS — Z886 Allergy status to analgesic agent status: Secondary | ICD-10-CM

## 2023-01-05 DIAGNOSIS — E785 Hyperlipidemia, unspecified: Secondary | ICD-10-CM | POA: Diagnosis present

## 2023-01-05 DIAGNOSIS — G629 Polyneuropathy, unspecified: Secondary | ICD-10-CM

## 2023-01-05 DIAGNOSIS — K317 Polyp of stomach and duodenum: Secondary | ICD-10-CM | POA: Diagnosis present

## 2023-01-05 DIAGNOSIS — I10 Essential (primary) hypertension: Secondary | ICD-10-CM | POA: Diagnosis present

## 2023-01-05 DIAGNOSIS — Z961 Presence of intraocular lens: Secondary | ICD-10-CM | POA: Diagnosis present

## 2023-01-05 DIAGNOSIS — Z8052 Family history of malignant neoplasm of bladder: Secondary | ICD-10-CM

## 2023-01-05 DIAGNOSIS — M8580 Other specified disorders of bone density and structure, unspecified site: Secondary | ICD-10-CM | POA: Diagnosis present

## 2023-01-05 DIAGNOSIS — F32A Depression, unspecified: Secondary | ICD-10-CM | POA: Diagnosis present

## 2023-01-05 DIAGNOSIS — J449 Chronic obstructive pulmonary disease, unspecified: Secondary | ICD-10-CM | POA: Diagnosis present

## 2023-01-05 DIAGNOSIS — R58 Hemorrhage, not elsewhere classified: Secondary | ICD-10-CM | POA: Diagnosis not present

## 2023-01-05 DIAGNOSIS — Z743 Need for continuous supervision: Secondary | ICD-10-CM | POA: Diagnosis not present

## 2023-01-05 DIAGNOSIS — Z7951 Long term (current) use of inhaled steroids: Secondary | ICD-10-CM

## 2023-01-05 DIAGNOSIS — J9611 Chronic respiratory failure with hypoxia: Secondary | ICD-10-CM | POA: Diagnosis present

## 2023-01-05 DIAGNOSIS — H409 Unspecified glaucoma: Secondary | ICD-10-CM | POA: Diagnosis present

## 2023-01-05 DIAGNOSIS — K922 Gastrointestinal hemorrhage, unspecified: Secondary | ICD-10-CM | POA: Diagnosis not present

## 2023-01-05 DIAGNOSIS — K921 Melena: Secondary | ICD-10-CM | POA: Diagnosis not present

## 2023-01-05 DIAGNOSIS — Z87891 Personal history of nicotine dependence: Secondary | ICD-10-CM

## 2023-01-05 DIAGNOSIS — Z79899 Other long term (current) drug therapy: Secondary | ICD-10-CM

## 2023-01-05 DIAGNOSIS — Z888 Allergy status to other drugs, medicaments and biological substances status: Secondary | ICD-10-CM

## 2023-01-05 DIAGNOSIS — Z8249 Family history of ischemic heart disease and other diseases of the circulatory system: Secondary | ICD-10-CM

## 2023-01-05 DIAGNOSIS — Z885 Allergy status to narcotic agent status: Secondary | ICD-10-CM

## 2023-01-05 DIAGNOSIS — Z7983 Long term (current) use of bisphosphonates: Secondary | ICD-10-CM

## 2023-01-05 DIAGNOSIS — Z85828 Personal history of other malignant neoplasm of skin: Secondary | ICD-10-CM

## 2023-01-05 DIAGNOSIS — K5730 Diverticulosis of large intestine without perforation or abscess without bleeding: Secondary | ICD-10-CM | POA: Diagnosis present

## 2023-01-05 DIAGNOSIS — K219 Gastro-esophageal reflux disease without esophagitis: Secondary | ICD-10-CM | POA: Diagnosis present

## 2023-01-05 DIAGNOSIS — Z9981 Dependence on supplemental oxygen: Secondary | ICD-10-CM

## 2023-01-05 LAB — POC OCCULT BLOOD, ED: Fecal Occult Bld: POSITIVE — AB

## 2023-01-05 LAB — CBC WITH DIFFERENTIAL/PLATELET
Abs Immature Granulocytes: 0.02 10*3/uL (ref 0.00–0.07)
Basophils Absolute: 0 10*3/uL (ref 0.0–0.1)
Basophils Relative: 0 %
Eosinophils Absolute: 0.1 10*3/uL (ref 0.0–0.5)
Eosinophils Relative: 1 %
HCT: 31.1 % — ABNORMAL LOW (ref 36.0–46.0)
Hemoglobin: 9.8 g/dL — ABNORMAL LOW (ref 12.0–15.0)
Immature Granulocytes: 0 %
Lymphocytes Relative: 19 %
Lymphs Abs: 1.5 10*3/uL (ref 0.7–4.0)
MCH: 31.4 pg (ref 26.0–34.0)
MCHC: 31.5 g/dL (ref 30.0–36.0)
MCV: 99.7 fL (ref 80.0–100.0)
Monocytes Absolute: 0.8 10*3/uL (ref 0.1–1.0)
Monocytes Relative: 10 %
Neutro Abs: 5.6 10*3/uL (ref 1.7–7.7)
Neutrophils Relative %: 70 %
Platelets: 158 10*3/uL (ref 150–400)
RBC: 3.12 MIL/uL — ABNORMAL LOW (ref 3.87–5.11)
RDW: 13.8 % (ref 11.5–15.5)
WBC: 8 10*3/uL (ref 4.0–10.5)
nRBC: 0 % (ref 0.0–0.2)

## 2023-01-05 LAB — COMPREHENSIVE METABOLIC PANEL
ALT: 12 U/L (ref 0–44)
AST: 18 U/L (ref 15–41)
Albumin: 3.5 g/dL (ref 3.5–5.0)
Alkaline Phosphatase: 37 U/L — ABNORMAL LOW (ref 38–126)
Anion gap: 8 (ref 5–15)
BUN: 22 mg/dL (ref 8–23)
CO2: 25 mmol/L (ref 22–32)
Calcium: 8.6 mg/dL — ABNORMAL LOW (ref 8.9–10.3)
Chloride: 105 mmol/L (ref 98–111)
Creatinine, Ser: 0.46 mg/dL (ref 0.44–1.00)
GFR, Estimated: >60 mL/min (ref >60)
Glucose, Bld: 110 mg/dL — ABNORMAL HIGH (ref 70–99)
Potassium: 3.9 mmol/L (ref 3.5–5.1)
Sodium: 138 mmol/L (ref 135–145)
Total Bilirubin: 0.2 mg/dL — ABNORMAL LOW (ref 0.3–1.2)
Total Protein: 6.5 g/dL (ref 6.5–8.1)

## 2023-01-05 LAB — TYPE AND SCREEN
ABO/RH(D): O POS
Antibody Screen: NEGATIVE

## 2023-01-05 LAB — PROTIME-INR
INR: 1.2 (ref 0.8–1.2)
Prothrombin Time: 14.9 seconds (ref 11.4–15.2)

## 2023-01-05 MED ORDER — SERTRALINE HCL 50 MG PO TABS
75.0000 mg | ORAL_TABLET | Freq: Every day | ORAL | Status: DC
  Administered 2023-01-05 – 2023-01-06 (×2): 75 mg via ORAL
  Filled 2023-01-05: qty 2
  Filled 2023-01-05: qty 1

## 2023-01-05 MED ORDER — LEVALBUTEROL HCL 0.63 MG/3ML IN NEBU: 0.6300 mg | INHALATION_SOLUTION | Freq: Four times a day (QID) | RESPIRATORY_TRACT | Status: DC | PRN

## 2023-01-05 MED ORDER — ONDANSETRON HCL 4 MG/2ML IJ SOLN: 4.0000 mg | Freq: Four times a day (QID) | INTRAMUSCULAR | Status: DC | PRN

## 2023-01-05 MED ORDER — PANTOPRAZOLE INFUSION (NEW) - SIMPLE MED
8.0000 mg/h | INTRAVENOUS | Status: DC
  Administered 2023-01-05 – 2023-01-06 (×3): 8 mg/h via INTRAVENOUS
  Filled 2023-01-05: qty 80
  Filled 2023-01-05: qty 100
  Filled 2023-01-05 (×2): qty 80

## 2023-01-05 MED ORDER — PANTOPRAZOLE 80MG IVPB - SIMPLE MED
80.0000 mg | Freq: Once | INTRAVENOUS | Status: AC
  Administered 2023-01-05: 80 mg via INTRAVENOUS
  Filled 2023-01-05: qty 80

## 2023-01-05 MED ORDER — ACETAMINOPHEN 325 MG PO TABS
650.0000 mg | ORAL_TABLET | Freq: Four times a day (QID) | ORAL | Status: DC | PRN
  Administered 2023-01-06: 650 mg via ORAL
  Filled 2023-01-05: qty 2

## 2023-01-05 MED ORDER — CARMEX CLASSIC LIP BALM EX OINT
1.0000 | TOPICAL_OINTMENT | CUTANEOUS | Status: DC | PRN
  Filled 2023-01-05: qty 10

## 2023-01-05 MED ORDER — TRAZODONE HCL 50 MG PO TABS
25.0000 mg | ORAL_TABLET | Freq: Every evening | ORAL | Status: DC | PRN
  Administered 2023-01-06: 25 mg via ORAL
  Filled 2023-01-05: qty 1

## 2023-01-05 MED ORDER — UMECLIDINIUM-VILANTEROL 62.5-25 MCG/ACT IN AEPB
1.0000 | INHALATION_SPRAY | Freq: Every day | RESPIRATORY_TRACT | Status: DC
  Administered 2023-01-07: 1 via RESPIRATORY_TRACT
  Filled 2023-01-05: qty 14

## 2023-01-05 MED ORDER — ONDANSETRON HCL 4 MG PO TABS: 4.0000 mg | ORAL_TABLET | Freq: Four times a day (QID) | ORAL | Status: DC | PRN

## 2023-01-05 MED ORDER — DORZOLAMIDE HCL-TIMOLOL MAL 2-0.5 % OP SOLN
1.0000 [drp] | Freq: Two times a day (BID) | OPHTHALMIC | Status: DC
  Administered 2023-01-05 – 2023-01-06 (×2): 1 [drp] via OPHTHALMIC
  Filled 2023-01-05: qty 10

## 2023-01-05 MED ORDER — SIMVASTATIN 20 MG PO TABS
20.0000 mg | ORAL_TABLET | Freq: Every day | ORAL | Status: DC
  Administered 2023-01-05 – 2023-01-06 (×2): 20 mg via ORAL
  Filled 2023-01-05 (×2): qty 1

## 2023-01-05 MED ORDER — LATANOPROST 0.005 % OP SOLN
1.0000 [drp] | Freq: Every day | OPHTHALMIC | Status: DC
  Administered 2023-01-05: 1 [drp] via OPHTHALMIC
  Filled 2023-01-05: qty 2.5

## 2023-01-05 MED ORDER — METOPROLOL TARTRATE 25 MG/10 ML ORAL SUSPENSION
6.2500 mg | Freq: Two times a day (BID) | ORAL | Status: DC
  Administered 2023-01-05 – 2023-01-07 (×4): 6.25 mg via ORAL
  Filled 2023-01-05 (×4): qty 5

## 2023-01-05 MED ORDER — ACETAMINOPHEN 650 MG RE SUPP: 650.0000 mg | Freq: Four times a day (QID) | RECTAL | Status: DC | PRN

## 2023-01-05 MED ORDER — METOPROLOL TARTRATE 25 MG PO TABS: 12.5000 mg | ORAL_TABLET | Freq: Two times a day (BID) | ORAL | Status: DC

## 2023-01-05 NOTE — ED Provider Notes (Signed)
[place] EMERGENCY DEPARTMENT AT [facility] Provider Note   CSN: [id number] Arrival date & time: 01/05/23  1054     History  Chief Complaint  Patient presents with   Melena   Emesis    [name] is a 82 y.o. female.  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  Dr. Marca Ancona Deboraha Sprang) GI did a colonoscopy on 11/4 and Dr. Ewing Schlein did an endoscopy on 7/14.  Colonoscopy: Impression:               - Preparation of the colon was poor.                           - Blood in the entire examined colon.                           - Diverticulosis in the sigmoid colon, in the                            descending colon, at the splenic flexure, in the                            transverse colon, at the hepatic flexure and in the                            ascending colon.                           - Two 8 to 12 mm polyps in the transverse colon,                            removed piecemeal using a hot snare. Resected and                            retrieved.  Endoscopy:  Impression:               - Normal larynx.                           - Questionably tiny hiatal hernia.                           - Multiple gastric polyps.                           - A small amount of food (residue) in the stomach.                           - Normal duodenal bulb, first portion of the                            duodenum, second portion of the duodenum and third  portion of the duodenum.                           - The examination was otherwise normal.                           - No specimens collected. No signs of bleeding   Pt noticed some dark stool last night.  She did say she's been feeling weak for the past week.             Home Medications Prior to Admission medications   Medication Sig Start Date End Date Taking?  Authorizing Provider  acetaminophen (TYLENOL) 650 MG CR tablet Take 650 mg by mouth in the morning and at bedtime.    [provider]  albuterol (VENTOLIN HFA) 108 (90 Base) MCG/ACT inhaler Inhale 2 puffs into the lungs every 4 (four) hours as needed for wheezing or shortness of breath.    [provider]  alendronate (FOSAMAX) 70 MG tablet Take 70 mg by mouth every Sunday. Take with a full glass of water on an empty stomach.    [provider]  Ascorbic Acid (VITAMIN C) 1000 MG tablet Take 1,000 mg by mouth every morning.    [provider]  cetirizine (ZYRTEC) 10 MG tablet Take 1 tablet (10 mg total) by mouth at bedtime. 10/22/22   [name], [name], NP  Cholecalciferol (VITAMIN D3) 50 MCG (2000 UT) TABS Take 2,000 Units by mouth every morning.    [provider]  docusate sodium (COLACE) 100 MG capsule Take 200 mg by mouth at bedtime.    [provider]  dorzolamide-timolol (COSOPT) 22.3-6.8 MG/ML ophthalmic solution Place 1 drop into both eyes 2 (two) times daily.    [provider]  ferrous gluconate (FERGON) 240 (27 FE) MG tablet Take 240 mg by mouth every morning. Take without food    [provider]  gabapentin (NEURONTIN) 100 MG capsule Take 1 capsule (100 mg total) by mouth at bedtime. 12/20/22   [name], [name], NP  latanoprost (XALATAN) 0.005 % ophthalmic solution Place 1 drop into both eyes at bedtime. 02/13/18   [provider]  METOPROLOL TARTRATE PO Take 6.25 mg by mouth in the morning and at bedtime.    [provider]  Multiple Vitamins-Minerals (PRESERVISION AREDS 2) CAPS Take 1 capsule by mouth 2 (two) times daily.    [provider]  Netarsudil Dimesylate (RHOPRESSA) 0.02 % SOLN Place 1 drop into both eyes at bedtime.    [provider]  oxybutynin (DITROPAN) 5 MG tablet Take 5 mg by mouth daily.    [provider]  pantoprazole (PROTONIX) 40 MG tablet Take 40 mg by mouth 2  (two) times daily.    [provider]  polyethylene glycol (MIRALAX / GLYCOLAX) 17 g packet Take 8.5 g by mouth daily as needed (constipation).    [provider]  Probiotic Product (ALIGN) 4 MG CAPS Take 4 mg by mouth at bedtime.    [provider]  rOPINIRole (REQUIP) 0.5 MG tablet Take 0.5 mg by mouth as needed.    [provider]  sertraline (ZOLOFT) 50 MG tablet Take 75 mg by mouth daily.    [provider]  simvastatin (ZOCOR) 20 MG tablet Take 20 mg by mouth daily.    [provider]  Sodium Fluoride (PREVIDENT 5000 BOOSTER PLUS) 1.1 % PSTE Place  1 Application onto teeth See admin instructions. Brush on teeth with a toothbrush after evening mouth care. Spit out excess and do not rinse.    [provider]  umeclidinium-vilanterol (ANORO ELLIPTA) 62.5-25 MCG/ACT AEPB Inhale 1 puff into the lungs every morning.    [provider]  zinc oxide 20 % ointment Apply 1 Application topically See admin instructions. Apply topically to buttocks after every incontinent episode and as needed for redness    [provider]      Allergies    Morphine and codeine, Aspirin, Duloxetine hcl, Tymlos [abaloparatide], Ventolin [albuterol], and Cefadroxil    Review of Systems   Review of Systems  Gastrointestinal:  Positive for blood in stool.       Black stool  Neurological:  Positive for weakness.  All other systems reviewed and are negative.   Physical Exam Updated Vital Signs BP 128/60 (BP Location: Right Arm)   Pulse 86   Temp 98.3 F (36.8 C) (Oral)   Resp 16   SpO2 95%  Physical Exam Vitals and nursing note reviewed. Exam conducted with a chaperone present.  Constitutional:      Appearance: Normal appearance.  HENT:     Head: Normocephalic and atraumatic.     Right Ear: External ear normal.     Left Ear: External ear normal.     Nose: Nose normal.     Mouth/Throat:     Mouth: Mucous membranes are moist.      Pharynx: Oropharynx is clear.  Eyes:     Extraocular Movements: Extraocular movements intact.     Conjunctiva/sclera: Conjunctivae normal.     Pupils: Pupils are equal, round, and reactive to light.  Cardiovascular:     Rate and Rhythm: Normal rate and regular rhythm.     Pulses: Normal pulses.     Heart sounds: Normal heart sounds.  Pulmonary:     Effort: Pulmonary effort is normal.     Breath sounds: Normal breath sounds.  Abdominal:     General: Abdomen is flat. Bowel sounds are normal.     Palpations: Abdomen is soft.  Genitourinary:    Rectum: Guaiac result positive.     Comments: Black stool Musculoskeletal:        General: Normal range of motion.     Cervical back: Normal range of motion and neck supple.  Skin:    General: Skin is warm.     Capillary Refill: Capillary refill takes less than 2 seconds.  Neurological:     General: No focal deficit present.     Mental Status: She is alert and oriented to person, place, and time.  Psychiatric:        Mood and Affect: Mood normal.        Behavior: Behavior normal.     ED Results / Procedures / Treatments   Labs (all labs ordered are listed, but only abnormal results are displayed) Labs Reviewed  COMPREHENSIVE METABOLIC PANEL - Abnormal; Notable for the following components:      Result Value   Glucose, Bld 110 (*)    Calcium 8.6 (*)    Alkaline Phosphatase 37 (*)    Total Bilirubin 0.2 (*)    All other components within normal limits  CBC WITH DIFFERENTIAL/PLATELET - Abnormal; Notable for the following components:   RBC 3.12 (*)    Hemoglobin 9.8 (*)    HCT 31.1 (*)    All other components within normal limits  POC OCCULT BLOOD, ED -  Abnormal; Notable for the following components:   Fecal Occult Bld POSITIVE (*)    All other components within normal limits  PROTIME-INR  TYPE AND SCREEN    EKG EKG Interpretation Date/Time:  Sunday January 05 2023 11:08:37 EDT Ventricular Rate:  91 PR Interval:  140 QRS  Duration:  143 QT Interval:  381 QTC Calculation: 469 R Axis:   91  Text Interpretation: 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 FINDINGS: Normal mediastinum and cardiac silhouette. Normal pulmonary vasculature. No evidence of effusion, infiltrate, or pneumothorax. No acute bony abnormality. IMPRESSION: No acute cardiopulmonary process. Electronically Signed   By: Genevive Bi M.D.   On: 01/05/2023 11:27    Procedures Procedures    Medications Ordered in ED Medications  pantoprozole (PROTONIX) 80 mg /NS 100 mL infusion (8 mg/hr Intravenous New Bag/Given 01/05/23 1201)  pantoprazole (PROTONIX) 80 mg /NS 100 mL IVPB (0 mg Intravenous Stopped 01/05/23 1157)    ED Course/ Medical Decision Making/ A&P                             Medical Decision Making Amount and/or Complexity of Data Reviewed Labs: ordered. Radiology: ordered.  Risk Decision regarding hospitalization.   This patient presents to the ED for concern of black stool, this involves an extensive number of treatment options, and is a complaint that carries with it a high risk of complications and morbidity.  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  The pertinent results include:  cbc with hgb 9.8 (hgb 10.0 on 5/6); inr 1.2; cmp nl   Imaging Studies ordered:  I ordered imaging studies  including cxr  I independently visualized and interpreted imaging which showed No acute cardiopulmonary process.  I agree with the radiologist interpretation   Cardiac Monitoring:  The patient was maintained on a cardiac monitor.  I personally viewed and interpreted the cardiac monitored which showed an underlying rhythm of: nsr   Medicines ordered and prescription drug management:  I ordered medication including protonix  for gi bleed  Reevaluation of the patient after these medicines showed that the patient improved I have reviewed the patients home medicines and have made adjustments as needed  Consultations Obtained:  I requested consultation with the gastroenterologist (Dr. Bosie Clos),  and discussed lab and imaging findings as well as pertinent plan - he will see her today Pt d/w Dr. Kirby Crigler ([facility]) who will admit.  Problem List / ED Course:  GI bleed:   hgb stable.  Vitals stable.  Protonix gtt started.  GI consulted.   Reevaluation:  After the interventions noted above, I reevaluated the patient and found that they have :improved   Social Determinants of Health:  Lives at home   Dispostion:  After consideration of the diagnostic results and the patients response to treatment, I feel that the patent would benefit from admission.  Final Clinical Impression(s) / ED Diagnoses Final diagnoses:  Upper GI bleed    Rx / DC Orders ED Discharge Orders     None         Jacalyn Lefevre, MD 01/05/23 1255

## 2023-01-05 NOTE — H&P (Signed)
History and Physical  [name] WUJ:[id number] DOB: Nov 13, 1940 DOA: 01/05/2023  PCP: Octavia Heir, NP   Chief Complaint: Tarry stools  HPI: [name] is a 82 y.o. female with medical history significant for depression, GERD, hyperlipidemia, diverticular bleeding with embolization in 2022, being admitted to the hospital with suspected upper GI bleed due to dark stools over the last couple of weeks.  She lives at a local nursing home, was brought to the ER for evaluation of the dark stools.  Denies any abdominal pain, nausea, vomiting, fevers, chills, diarrhea.  ED Course: Vital signs in the ER unremarkable, lab work reveals hemoglobin 9.8 stable from May which appears to be her baseline.  Rectal exam in the ER currently positive for black heme positive stool.  Patient was started on IV Protonix drip, and has already been seen by gastroenterology who plans EGD in the morning.  Review of Systems: Please see HPI for pertinent positives and negatives. A complete 10 system review of systems are otherwise negative.  Past Medical History:  Diagnosis Date   Anxiety    Arthritis    "bilateral knees, shoulders, elbows; neck, pretty widespread" (09/05/2017)   BPPV (benign paroxysmal positional vertigo)    Depression    GERD (gastroesophageal reflux disease)    Glaucoma, both eyes    Headache    "probably 2/month" (09/05/2017)   History of blood transfusion ~ 2008   "related to LGIB"   Hyperlipemia    Lower GI bleeding ~ 2008; 09/05/2017   "had to have blood transfusion"   Macular degeneration, bilateral    Osteopenia    Seasonal allergies    Skin cancer, basal cell 2001   "off my nose, left side"   Sleeping excessive    Tinnitus of both ears    Past Surgical History:  Procedure Laterality Date   BALLOON DILATION N/A 05/08/2021   Procedure: BALLOON DILATION;  Surgeon: Kerin Salen, MD;  Location: [facility] ENDOSCOPY;  Service: Gastroenterology;  Laterality: N/A;   BASAL CELL CARCINOMA EXCISION   2001   "off my nose, left side"   BIOPSY  05/08/2021   Procedure: BIOPSY;  Surgeon: Kerin Salen, MD;  Location: [facility] ENDOSCOPY;  Service: Gastroenterology;;   BLEPHAROPLASTY Bilateral    CATARACT EXTRACTION W/ INTRAOCULAR LENS  IMPLANT, BILATERAL Bilateral 1990's   COLONOSCOPY WITH PROPOFOL N/A 05/04/2022   Procedure: COLONOSCOPY WITH PROPOFOL;  Surgeon: Kerin Salen, MD;  Location: [facility] ENDOSCOPY;  Service: Gastroenterology;  Laterality: N/A;   ESOPHAGOGASTRODUODENOSCOPY (EGD) WITH PROPOFOL N/A 05/08/2021   Procedure: ESOPHAGOGASTRODUODENOSCOPY (EGD) WITH PROPOFOL;  Surgeon: Kerin Salen, MD;  Location: [facility] ENDOSCOPY;  Service: Gastroenterology;  Laterality: N/A;   ESOPHAGOGASTRODUODENOSCOPY (EGD) WITH PROPOFOL N/A 01/11/2022   Procedure: ESOPHAGOGASTRODUODENOSCOPY (EGD) WITH PROPOFOL;  Surgeon: Vida Rigger, MD;  Location: [facility] ENDOSCOPY;  Service: Gastroenterology;  Laterality: N/A;   EYE SURGERY Bilateral    "to improve vision after cataract OR"   IR ANGIOGRAM FOLLOW UP STUDY  12/19/2020   IR ANGIOGRAM SELECTIVE EACH ADDITIONAL VESSEL  12/19/2020   IR ANGIOGRAM SELECTIVE EACH ADDITIONAL VESSEL  12/19/2020   IR ANGIOGRAM VISCERAL SELECTIVE  12/19/2020   IR EMBO ART  VEN HEMORR LYMPH EXTRAV  INC GUIDE ROADMAPPING  12/19/2020   IR US GUIDE VASC ACCESS RIGHT  12/19/2020   JOINT REPLACEMENT     POLYPECTOMY  05/04/2022   Procedure: POLYPECTOMY;  Surgeon: Kerin Salen, MD;  Location: [facility] ENDOSCOPY;  Service: Gastroenterology;;   STAPEDES SURGERY Left    "scraped stapedes because  it was sticking when it wasn't suppose to"   TONSILLECTOMY AND ADENOIDECTOMY  1946   TOTAL KNEE ARTHROPLASTY Left ~ 2008   TOTAL KNEE ARTHROPLASTY  12/20/2011   Procedure: TOTAL KNEE ARTHROPLASTY;  Surgeon: Verlee Rossetti, MD;  Location: [facility] OR;  Service: Orthopedics;  Laterality: Right;  Right Total Knee Arthroplasty   TUBAL LIGATION  1980's    Social History:  reports that she quit smoking about 30 years ago. Her smoking use included  cigarettes. She has a 27.75 pack-year smoking history. She has never used smokeless tobacco. She reports current alcohol use of about 7.0 standard drinks of alcohol per week. She reports that she does not use drugs.   Allergies  Allergen Reactions   Morphine And Codeine Itching   Aspirin Other (See Comments)    Unknown reaction   Duloxetine Hcl Other (See Comments)    Unknown reaction   Tymlos [Abaloparatide] Other (See Comments)    Unknown reaction   Ventolin [Albuterol] Other (See Comments)    rapid heart beat   Cefadroxil Hives    Patient can take amoxicillin and cipro    Family History  Problem Relation Age of Onset   Heart attack Mother    Heart disease Father    Heart failure Father    Bladder Cancer Father    Supraventricular tachycardia Sister    Hypertension Sister      Prior to Admission medications   Medication Sig Start Date End Date Taking? Authorizing Provider  acetaminophen (TYLENOL) 650 MG CR tablet Take 650 mg by mouth in the morning and at bedtime.    [provider]  albuterol (VENTOLIN HFA) 108 (90 Base) MCG/ACT inhaler Inhale 2 puffs into the lungs every 4 (four) hours as needed for wheezing or shortness of breath.    [provider]  alendronate (FOSAMAX) 70 MG tablet Take 70 mg by mouth every Sunday. Take with a full glass of water on an empty stomach.    [provider]  Ascorbic Acid (VITAMIN C) 1000 MG tablet Take 1,000 mg by mouth every morning.    [provider]  cetirizine (ZYRTEC) 10 MG tablet Take 1 tablet (10 mg total) by mouth at bedtime. 10/22/22   [name], [name], NP  Cholecalciferol (VITAMIN D3) 50 MCG (2000 UT) TABS Take 2,000 Units by mouth every morning.    [provider]  docusate sodium (COLACE) 100 MG capsule Take 200 mg by mouth at bedtime.    [provider]  dorzolamide-timolol (COSOPT) 22.3-6.8 MG/ML ophthalmic solution Place 1 drop into both eyes 2 (two) times daily.    [provider]  ferrous gluconate (FERGON) 240 (27 FE) MG tablet Take 240 mg by mouth every morning. Take without food    [provider]  gabapentin (NEURONTIN) 100 MG capsule Take 1 capsule (100 mg total) by mouth at bedtime. 12/20/22   [name], [name], NP  latanoprost (XALATAN) 0.005 % ophthalmic solution Place 1 drop into both eyes at bedtime. 02/13/18   [provider]  METOPROLOL TARTRATE PO Take 6.25 mg by mouth in the morning and at bedtime.    [provider]  Multiple Vitamins-Minerals (PRESERVISION AREDS 2) CAPS Take 1 capsule by mouth 2 (two) times daily.    [provider]  Netarsudil Dimesylate (RHOPRESSA) 0.02 % SOLN Place 1 drop into both eyes at bedtime.    [provider]  oxybutynin (DITROPAN) 5 MG tablet Take 5 mg by mouth daily.  [provider]  pantoprazole (PROTONIX) 40 MG tablet Take 40 mg by mouth 2 (two) times daily.    [provider]  polyethylene glycol (MIRALAX / GLYCOLAX) 17 g packet Take 8.5 g by mouth daily as needed (constipation).    [provider]  Probiotic Product (ALIGN) 4 MG CAPS Take 4 mg by mouth at bedtime.    [provider]  rOPINIRole (REQUIP) 0.5 MG tablet Take 0.5 mg by mouth as needed.    [provider]  sertraline (ZOLOFT) 50 MG tablet Take 75 mg by mouth daily.    [provider]  simvastatin (ZOCOR) 20 MG tablet Take 20 mg by mouth daily.    [provider]  Sodium Fluoride (PREVIDENT 5000 BOOSTER PLUS) 1.1 % PSTE Place 1 Application onto teeth See admin instructions. Brush on teeth with a toothbrush after evening mouth care. Spit out excess and do not rinse.    [provider]  umeclidinium-vilanterol (ANORO ELLIPTA) 62.5-25 MCG/ACT AEPB Inhale 1 puff into the lungs every morning.    [provider]  zinc oxide 20 % ointment Apply 1 Application topically See admin instructions. Apply topically to buttocks after every  incontinent episode and as needed for redness    [provider]    Physical Exam: BP 128/60 (BP Location: Right Arm)   Pulse 86   Temp 98.3 F (36.8 C) (Oral)   Resp 16   SpO2 95%   General:  Alert, oriented, calm, in no acute distress, husband at the bedside Eyes: EOMI, clear conjuctivae, white sclerea Neck: supple, no masses, trachea mildline  Cardiovascular: RRR, no murmurs or rubs, no peripheral edema  Respiratory: clear to auscultation bilaterally, no wheezes, no crackles  Abdomen: soft, nontender, nondistended, normal bowel tones heard  Skin: dry, no rashes  Musculoskeletal: no joint effusions, normal range of motion  Psychiatric: appropriate affect, normal speech  Neurologic: extraocular muscles intact, clear speech, moving all extremities with intact sensorium          Labs on Admission:  Basic Metabolic Panel: Recent Labs  Lab 01/05/23 1120  NA 138  K 3.9  CL 105  CO2 25  GLUCOSE 110*  BUN 22  CREATININE 0.46  CALCIUM 8.6*   Liver Function Tests: Recent Labs  Lab 01/05/23 1120  AST 18  ALT 12  ALKPHOS 37*  BILITOT 0.2*  PROT 6.5  ALBUMIN 3.5   No results for input(s): "LIPASE", "AMYLASE" in the last 168 hours. No results for input(s): "AMMONIA" in the last 168 hours. CBC: Recent Labs  Lab 01/05/23 1120  WBC 8.0  NEUTROABS 5.6  HGB 9.8*  HCT 31.1*  MCV 99.7  PLT 158   Cardiac Enzymes: No results for input(s): "CKTOTAL", "CKMB", "CKMBINDEX", "TROPONINI" in the last 168 hours.  BNP (last 3 results) No results for input(s): "BNP" in the last 8760 hours.  ProBNP (last 3 results) No results for input(s): "PROBNP" in the last 8760 hours.  CBG: No results for input(s): "GLUCAP" in the last 168 hours.  Radiological Exams on Admission: DG Chest Port 1 View  Result Date: 01/05/2023 CLINICAL DATA:  Dark red stool EXAM: PORTABLE CHEST 1 VIEW COMPARISON:  None Available. FINDINGS: Normal mediastinum and cardiac silhouette. Normal  pulmonary vasculature. No evidence of effusion, infiltrate, or pneumothorax. No acute bony abnormality. IMPRESSION: No acute cardiopulmonary process. Electronically Signed   By: Genevive Bi M.D.   On: 01/05/2023 11:27    Assessment/Plan 82 year old female with a history of hyperlipidemia,  GERD, depression, diverticular bleeding requiring embolization in 2022 being admitted to the hospital with several days of dark tarry stools concerning for upper GI bleed.  Melena-concerning for upper GI bleed, patient is not on any blood thinners -Observation admission -Avoid blood thinners -IV Protonix drip -Appreciate GI consult -Clear liquid diet, n.p.o. after midnight  Depression-Zoloft  Hyperlipidemia-Zocor  Glaucoma-continue Cosopt, Xalatan  DVT prophylaxis: SCDs only    Code Status: Full Code  Consults called: None  Admission status: Observation  Time spent: 48 minutes  Iris Tatsch Sharlette Dense MD [facility] Pager [phone]  If 7PM-7AM, please contact night-coverage [email or web address] Password [email or web address]  01/05/2023, 1:27 PM

## 2023-01-05 NOTE — ED Notes (Signed)
ED TO INPATIENT HANDOFF REPORT  Name/Age/Gender [name] 82 y.o. female  Code Status    Code Status Orders  (From admission, onward)           Start     Ordered   01/05/23 1325  Full code  Continuous       Question:  By:  Answer:  Consent: discussion documented in EHR   01/05/23 1325           Code Status History     Date Active Date Inactive Code Status Order ID Comments User Context   05/02/2022 2315 05/07/2022 2035 Full Code [id number]  Anselm Jungling, DO ED   01/07/2022 2336 01/14/2022 1858 DNR [id number]  Lurline Del, MD ED   05/02/2021 0253 05/10/2021 2021 Full Code [id number]  Eduard Clos, MD ED   12/18/2020 1813 12/24/2020 1546 Full Code [id number]  Emeline General, MD ED   09/05/2017 1400 09/14/2017 1938 Full Code [id number]  Lahoma Crocker, MD ED   08/07/2017 1806 08/10/2017 1821 Full Code [id number]  Tyrone Nine, MD ED   10/14/2016 2328 10/16/2016 1918 Full Code [id number]  Briscoe Deutscher, MD ED   12/20/2011 1202 12/23/2011 1709 Full Code [id number]  Kai Levins, RN Inpatient       Home/SNF/Other Skilled nursing facility  Chief Complaint Melena [K92.1]  Level of Care/Admitting Diagnosis ED Disposition     ED Disposition  Admit   Condition  --   Comment  Hospital Area: [facility] [[id number]]  Level of Care: Med-Surg [16]  May place patient in observation at [facility] or [facility] if equivalent level of care is available:: Yes  Covid Evaluation: Asymptomatic - no recent exposure (last 10 days) testing not required  Diagnosis: Melena [[id number]]  Admitting Physician: Maryln Gottron [[id number]]  Attending Physician: Kirby Crigler, MIR [email or web address] [[id number]]          Medical History Past Medical History:  Diagnosis Date   Anxiety    Arthritis    "bilateral knees, shoulders, elbows; neck, pretty widespread" (09/05/2017)   BPPV (benign paroxysmal positional vertigo)    Depression    GERD (gastroesophageal reflux disease)     Glaucoma, both eyes    Headache    "probably 2/month" (09/05/2017)   History of blood transfusion ~ 2008   "related to LGIB"   Hyperlipemia    Lower GI bleeding ~ 2008; 09/05/2017   "had to have blood transfusion"   Macular degeneration, bilateral    Osteopenia    Seasonal allergies    Skin cancer, basal cell 2001   "off my nose, left side"   Sleeping excessive    Tinnitus of both ears     Allergies Allergies  Allergen Reactions   Morphine And Codeine Itching   Aspirin Other (See Comments)    Unknown reaction   Duloxetine Hcl Other (See Comments)    Unknown reaction   Tymlos [Abaloparatide] Other (See Comments)    Unknown reaction   Ventolin [Albuterol] Other (See Comments)    rapid heart beat   Cefadroxil Hives    Patient can take amoxicillin and cipro    IV Location/Drains/Wounds Patient Lines/Drains/Airways Status     Active Line/Drains/Airways     Name Placement date Placement time Site Days   Peripheral IV 01/05/23 20 G Right Antecubital 01/05/23  1123  Antecubital  less than 1   External Urinary Catheter 05/03/22  0700  --  247  Labs/Imaging Results for orders placed or performed during the hospital encounter of 01/05/23 (from the past 48 hour(s))  POC occult blood, ED Provider will collect     Status: Abnormal   Collection Time: 01/05/23 11:08 AM  Result Value Ref Range   Fecal Occult Bld POSITIVE (A) NEGATIVE  Comprehensive metabolic panel     Status: Abnormal   Collection Time: 01/05/23 11:20 AM  Result Value Ref Range   Sodium 138 135 - 145 mmol/L   Potassium 3.9 3.5 - 5.1 mmol/L   Chloride 105 98 - 111 mmol/L   CO2 25 22 - 32 mmol/L   Glucose, Bld 110 (H) 70 - 99 mg/dL    Comment: Glucose reference range applies only to samples taken after fasting for at least 8 hours.   BUN 22 8 - 23 mg/dL   Creatinine, Ser 7.82 0.44 - 1.00 mg/dL   Calcium 8.6 (L) 8.9 - 10.3 mg/dL   Total Protein 6.5 6.5 - 8.1 g/dL   Albumin 3.5 3.5 - 5.0 g/dL    AST 18 15 - 41 U/L   ALT 12 0 - 44 U/L   Alkaline Phosphatase 37 (L) 38 - 126 U/L   Total Bilirubin 0.2 (L) 0.3 - 1.2 mg/dL   GFR, Estimated >95 >62 mL/min    Comment: (NOTE) Calculated using the CKD-EPI Creatinine Equation (2021)    Anion gap 8 5 - 15    Comment: Performed at [facility], [address], [place], [place] [address]  CBC with Differential     Status: Abnormal   Collection Time: 01/05/23 11:20 AM  Result Value Ref Range   WBC 8.0 4.0 - 10.5 K/uL   RBC 3.12 (L) 3.87 - 5.11 MIL/uL   Hemoglobin 9.8 (L) 12.0 - 15.0 g/dL   HCT 57.8 (L) 46.9 - 62.9 %   MCV 99.7 80.0 - 100.0 fL   MCH 31.4 26.0 - 34.0 pg   MCHC 31.5 30.0 - 36.0 g/dL   RDW 52.8 41.3 - 24.4 %   Platelets 158 150 - 400 K/uL   nRBC 0.0 0.0 - 0.2 %   Neutrophils Relative % 70 %   Neutro Abs 5.6 1.7 - 7.7 K/uL   Lymphocytes Relative 19 %   Lymphs Abs 1.5 0.7 - 4.0 K/uL   Monocytes Relative 10 %   Monocytes Absolute 0.8 0.1 - 1.0 K/uL   Eosinophils Relative 1 %   Eosinophils Absolute 0.1 0.0 - 0.5 K/uL   Basophils Relative 0 %   Basophils Absolute 0.0 0.0 - 0.1 K/uL   Immature Granulocytes 0 %   Abs Immature Granulocytes 0.02 0.00 - 0.07 K/uL    Comment: Performed at [facility], [address], [place], [place] [address]  Protime-INR     Status: None   Collection Time: 01/05/23 11:20 AM  Result Value Ref Range   Prothrombin Time 14.9 11.4 - 15.2 seconds   INR 1.2 0.8 - 1.2    Comment: (NOTE) INR goal varies based on device and disease states. Performed at [facility], [address], [place], [place] [address]    DG Chest Port 1 View  Result Date: 01/05/2023 CLINICAL DATA:  Dark red stool EXAM: PORTABLE CHEST 1 VIEW COMPARISON:  None Available. FINDINGS: Normal mediastinum and cardiac silhouette. Normal pulmonary vasculature. No evidence of effusion, infiltrate, or pneumothorax. No acute bony abnormality. IMPRESSION: No acute cardiopulmonary  process. Electronically Signed   By: Loura Halt.D.  On: 01/05/2023 11:27    Pending Labs Unresulted Labs (From admission, onward)     Start     Ordered   01/05/23 1103  Type and screen [facility]  Once,   STAT       Comments: [facility]    01/05/23 1102   Signed and Held  Basic metabolic panel  Tomorrow morning,   R        Signed and Held   Signed and Held  CBC  Tomorrow morning,   R        Signed and Held            Vitals/Pain Today's Vitals   01/05/23 1103 01/05/23 1108 01/05/23 1227  BP: 128/60    Pulse: 86    Resp: 16    Temp: 98.3 F (36.8 C)    TempSrc: Oral    SpO2: 95%    PainSc:  0-No pain 0-No pain    Isolation Precautions No active isolations  Medications Medications  pantoprozole (PROTONIX) 80 mg /NS 100 mL infusion (8 mg/hr Intravenous New Bag/Given 01/05/23 1201)  dorzolamide-timolol (COSOPT) 2-0.5 % ophthalmic solution 1 drop (has no administration in time range)  latanoprost (XALATAN) 0.005 % ophthalmic solution 1 drop (has no administration in time range)  metoprolol tartrate (LOPRESSOR) tablet 12.5 mg (has no administration in time range)  sertraline (ZOLOFT) tablet 100 mg (has no administration in time range)  simvastatin (ZOCOR) tablet 20 mg (has no administration in time range)  umeclidinium-vilanterol (ANORO ELLIPTA) 62.5-25 MCG/ACT 1 puff (has no administration in time range)  pantoprazole (PROTONIX) 80 mg /NS 100 mL IVPB (0 mg Intravenous Stopped 01/05/23 1157)    Mobility walks with device

## 2023-01-05 NOTE — Consult Note (Signed)
Referring Provider: Dr. Particia Nearing Primary Care Physician:  Octavia Heir, NP Primary Gastroenterologist:  Dr. Ewing Schlein  Reason for Consultation:  Melena  HPI: [name] is a 82 y.o. female with history of GI bleeding including diverticular bleeding with embolization (2022) presents with worsening black stools (she reports chronic black stools for years on iron pills) stating that blood was more visible to her NH staff and when it continued she was sent to ER. Denies abdominal pain, N/V. GI Bleed last year with colonoscopy 11/23 showed blood throughout colon without source pinpointed and CTA negative. Diffuse diverticulosis noted and 2 sessile serrated polyps removed. EGD in July 2023 showed gastric polyps without source of melena. Hgb 9.8 (10 in May 2024). Black heme positive stool on rectal by Dr. Particia Nearing.  Past Medical History:  Diagnosis Date   Anxiety    Arthritis    "bilateral knees, shoulders, elbows; neck, pretty widespread" (09/05/2017)   BPPV (benign paroxysmal positional vertigo)    Depression    GERD (gastroesophageal reflux disease)    Glaucoma, both eyes    Headache    "probably 2/month" (09/05/2017)   History of blood transfusion ~ 2008   "related to LGIB"   Hyperlipemia    Lower GI bleeding ~ 2008; 09/05/2017   "had to have blood transfusion"   Macular degeneration, bilateral    Osteopenia    Seasonal allergies    Skin cancer, basal cell 2001   "off my nose, left side"   Sleeping excessive    Tinnitus of both ears     Past Surgical History:  Procedure Laterality Date   BALLOON DILATION N/A 05/08/2021   Procedure: BALLOON DILATION;  Surgeon: Kerin Salen, MD;  Location: [facility] ENDOSCOPY;  Service: Gastroenterology;  Laterality: N/A;   BASAL CELL CARCINOMA EXCISION  2001   "off my nose, left side"   BIOPSY  05/08/2021   Procedure: BIOPSY;  Surgeon: Kerin Salen, MD;  Location: [facility] ENDOSCOPY;  Service: Gastroenterology;;   BLEPHAROPLASTY Bilateral    CATARACT EXTRACTION W/  INTRAOCULAR LENS  IMPLANT, BILATERAL Bilateral 1990's   COLONOSCOPY WITH PROPOFOL N/A 05/04/2022   Procedure: COLONOSCOPY WITH PROPOFOL;  Surgeon: Kerin Salen, MD;  Location: [facility] ENDOSCOPY;  Service: Gastroenterology;  Laterality: N/A;   ESOPHAGOGASTRODUODENOSCOPY (EGD) WITH PROPOFOL N/A 05/08/2021   Procedure: ESOPHAGOGASTRODUODENOSCOPY (EGD) WITH PROPOFOL;  Surgeon: Kerin Salen, MD;  Location: [facility] ENDOSCOPY;  Service: Gastroenterology;  Laterality: N/A;   ESOPHAGOGASTRODUODENOSCOPY (EGD) WITH PROPOFOL N/A 01/11/2022   Procedure: ESOPHAGOGASTRODUODENOSCOPY (EGD) WITH PROPOFOL;  Surgeon: Vida Rigger, MD;  Location: [facility] ENDOSCOPY;  Service: Gastroenterology;  Laterality: N/A;   EYE SURGERY Bilateral    "to improve vision after cataract OR"   IR ANGIOGRAM FOLLOW UP STUDY  12/19/2020   IR ANGIOGRAM SELECTIVE EACH ADDITIONAL VESSEL  12/19/2020   IR ANGIOGRAM SELECTIVE EACH ADDITIONAL VESSEL  12/19/2020   IR ANGIOGRAM VISCERAL SELECTIVE  12/19/2020   IR EMBO ART  VEN HEMORR LYMPH EXTRAV  INC GUIDE ROADMAPPING  12/19/2020   IR US GUIDE VASC ACCESS RIGHT  12/19/2020   JOINT REPLACEMENT     POLYPECTOMY  05/04/2022   Procedure: POLYPECTOMY;  Surgeon: Kerin Salen, MD;  Location: [facility] ENDOSCOPY;  Service: Gastroenterology;;   STAPEDES SURGERY Left    "scraped stapedes because it was sticking when it wasn't suppose to"   TONSILLECTOMY AND ADENOIDECTOMY  1946   TOTAL KNEE ARTHROPLASTY Left ~ 2008   TOTAL KNEE ARTHROPLASTY  12/20/2011   Procedure: TOTAL KNEE ARTHROPLASTY;  Surgeon: Verlee Rossetti,  MD;  Location: [facility] OR;  Service: Orthopedics;  Laterality: Right;  Right Total Knee Arthroplasty   TUBAL LIGATION  1980's    Prior to Admission medications   Medication Sig Start Date End Date Taking? Authorizing Provider  acetaminophen (TYLENOL) 650 MG CR tablet Take 650 mg by mouth in the morning and at bedtime.    [provider]  albuterol (VENTOLIN HFA) 108 (90 Base) MCG/ACT inhaler Inhale 2 puffs into the  lungs every 4 (four) hours as needed for wheezing or shortness of breath.    [provider]  alendronate (FOSAMAX) 70 MG tablet Take 70 mg by mouth every Sunday. Take with a full glass of water on an empty stomach.    [provider]  Ascorbic Acid (VITAMIN C) 1000 MG tablet Take 1,000 mg by mouth every morning.    [provider]  cetirizine (ZYRTEC) 10 MG tablet Take 1 tablet (10 mg total) by mouth at bedtime. 10/22/22   [name], [name], NP  Cholecalciferol (VITAMIN D3) 50 MCG (2000 UT) TABS Take 2,000 Units by mouth every morning.    [provider]  docusate sodium (COLACE) 100 MG capsule Take 200 mg by mouth at bedtime.    [provider]  dorzolamide-timolol (COSOPT) 22.3-6.8 MG/ML ophthalmic solution Place 1 drop into both eyes 2 (two) times daily.    [provider]  ferrous gluconate (FERGON) 240 (27 FE) MG tablet Take 240 mg by mouth every morning. Take without food    [provider]  gabapentin (NEURONTIN) 100 MG capsule Take 1 capsule (100 mg total) by mouth at bedtime. 12/20/22   [name], [name], NP  latanoprost (XALATAN) 0.005 % ophthalmic solution Place 1 drop into both eyes at bedtime. 02/13/18   [provider]  METOPROLOL TARTRATE PO Take 6.25 mg by mouth in the morning and at bedtime.    [provider]  Multiple Vitamins-Minerals (PRESERVISION AREDS 2) CAPS Take 1 capsule by mouth 2 (two) times daily.    [provider]  Netarsudil Dimesylate (RHOPRESSA) 0.02 % SOLN Place 1 drop into both eyes at bedtime.    [provider]  oxybutynin (DITROPAN) 5 MG tablet Take 5 mg by mouth daily.    [provider]  pantoprazole (PROTONIX) 40 MG tablet Take 40 mg by mouth 2 (two) times daily.    [provider]  polyethylene glycol (MIRALAX / GLYCOLAX) 17 g packet Take 8.5 g by mouth daily as needed (constipation).    [provider]  Probiotic Product (ALIGN) 4 MG CAPS Take 4  mg by mouth at bedtime.    [provider]  rOPINIRole (REQUIP) 0.5 MG tablet Take 0.5 mg by mouth as needed.    [provider]  sertraline (ZOLOFT) 50 MG tablet Take 75 mg by mouth daily.    [provider]  simvastatin (ZOCOR) 20 MG tablet Take 20 mg by mouth daily.    [provider]  Sodium Fluoride (PREVIDENT 5000 BOOSTER PLUS) 1.1 % PSTE Place 1 Application onto teeth See admin instructions. Brush on teeth with a toothbrush after evening mouth care. Spit out excess and do not rinse.    [provider]  umeclidinium-vilanterol (ANORO ELLIPTA) 62.5-25 MCG/ACT AEPB Inhale 1 puff into the lungs every morning.    [provider]  zinc oxide 20 % ointment Apply 1 Application topically See admin instructions. Apply topically to buttocks after every incontinent episode and as needed for redness  [provider]    Scheduled Meds: Continuous Infusions:  pantoprazole 8 mg/hr (01/05/23 1201)   PRN Meds:.  Allergies as of 01/05/2023 - Review Complete 01/05/2023  Allergen Reaction Noted   Morphine and codeine Itching 11/29/2011   Aspirin Other (See Comments) 10/22/2020   Duloxetine hcl Other (See Comments) 10/22/2020   Tymlos [abaloparatide] Other (See Comments) 10/22/2020   Ventolin [albuterol] Other (See Comments) 12/25/2020   Cefadroxil Hives 12/11/2011    Family History  Problem Relation Age of Onset   Heart attack Mother    Heart disease Father    Heart failure Father    Bladder Cancer Father    Supraventricular tachycardia Sister    Hypertension Sister     Social History   Socioeconomic History   Marital status: Married    Spouse name: Rosanne Ashing    Number of children: 3   Years of education: College   Highest education level: Not on file  Occupational History   Occupation: Retired Runner, broadcasting/film/video  Tobacco Use   Smoking status: Former    Packs/day: 0.75    Years: 37.00    Additional pack years: 0.00    Total pack  years: 27.75    Types: Cigarettes    Quit date: 07/01/1992    Years since quitting: 30.5   Smokeless tobacco: Never  Vaping Use   Vaping Use: Never used  Substance and Sexual Activity   Alcohol use: Yes    Alcohol/week: 7.0 standard drinks of alcohol    Types: 7 Glasses of wine per week    Comment: 09/05/2017  "6 oz wine, 5 days/wk"   Drug use: No    Types: Hydrocodone   Sexual activity: Not Currently  Other Topics Concern   Not on file  Social History Narrative   1-2 cups of coffee a day    Social Determinants of Health   Financial Resource Strain: Low Risk  (03/19/2022)   Overall Financial Resource Strain (CARDIA)    Difficulty of Paying Living Expenses: Not hard at all  Food Insecurity: No Food Insecurity (05/02/2022)   Hunger Vital Sign    Worried About Running Out of Food in the Last Year: Never true    Ran Out of Food in the Last Year: Never true  Transportation Needs: No Transportation Needs (05/02/2022)   PRAPARE - Administrator, Civil Service (Medical): No    Lack of Transportation (Non-Medical): No  Physical Activity: Insufficiently Active (03/19/2022)   Exercise Vital Sign    Days of Exercise per Week: 2 days    Minutes of Exercise per Session: 20 min  Stress: No Stress Concern Present (03/19/2022)   [employer] of Occupational Health - Occupational Stress Questionnaire    Feeling of Stress : Not at all  Social Connections: Moderately Isolated (03/19/2022)   Social Connection and Isolation Panel [NHANES]    Frequency of Communication with Friends and Family: More than three times a week    Frequency of Social Gatherings with Friends and Family: Twice a week    Attends Religious Services: Never    Database administrator or Organizations: No    Attends Banker Meetings: Never    Marital Status: Married  Catering manager Violence: Not At Risk (05/02/2022)   Humiliation, Afraid, Rape, and Kick questionnaire    Fear of Current or Ex-Partner:  No    Emotionally Abused: No    Physically Abused: No    Sexually Abused: No    Review of  Systems: All negative except as stated above in HPI.  Physical Exam: Vital signs: Vitals:   01/05/23 1103  BP: 128/60  Pulse: 86  Resp: 16  Temp: 98.3 F (36.8 C)  SpO2: 95%     General:   Lethargic, elderly, Well-developed, well-nourished, pleasant and cooperative in NAD Head: normocephalic, atraumatic Eyes: anicteric sclera ENT: oropharynx clear Neck: supple, nontender Lungs:  Clear throughout to auscultation.   No wheezes, crackles, or rhonchi. No acute distress. Heart:  Regular rate and rhythm; no murmurs, clicks, rubs,  or gallops. Abdomen: LLQ tenderness with minimal guarding, otherwise nontender, soft, nondistended, +BS  Rectal:  Deferred Ext: no edema  GI:  Lab Results: Recent Labs    01/05/23 1120  WBC 8.0  HGB 9.8*  HCT 31.1*  PLT 158   BMET Recent Labs    01/05/23 1120  NA 138  K 3.9  CL 105  CO2 25  GLUCOSE 110*  BUN 22  CREATININE 0.46  CALCIUM 8.6*   LFT Recent Labs    01/05/23 1120  PROT 6.5  ALBUMIN 3.5  AST 18  ALT 12  ALKPHOS 37*  BILITOT 0.2*   PT/INR Recent Labs    01/05/23 1120  LABPROT 14.9  INR 1.2     Studies/Results: DG Chest Port 1 View  Result Date: 01/05/2023 CLINICAL DATA:  Dark red stool EXAM: PORTABLE CHEST 1 VIEW COMPARISON:  None Available. FINDINGS: Normal mediastinum and cardiac silhouette. Normal pulmonary vasculature. No evidence of effusion, infiltrate, or pneumothorax. No acute bony abnormality. IMPRESSION: No acute cardiopulmonary process. Electronically Signed   By: Genevive Bi M.D.   On: 01/05/2023 11:27    Impression/Plan: Melenic stools with history of GI bleeding in the past. Question peptic ulcer disease vs AVMs. Doubt diverticular bleed. Agree with Protonix drip. Clear liquid diet. NPO p MN. Supportive care. EGD tomorrow by Dr. Ewing Schlein.     LOS: 0 days   [name]  01/05/2023, 12:58  PM  Questions please call [phone]

## 2023-01-05 NOTE — ED Triage Notes (Signed)
Pt BIB EMS from [facility], c/o dark red stool. Feeling 'bad' for the past week. First episode around 8pm last night and noticed gradual increase of blood in stool. Per EMS, coffee grind emesis and dark blood. Hx of GI bleed and previous ABD surgery. Denies abdominal pain, no blood thinners.   BP 138/73 P 108 spO2 94% RR 16

## 2023-01-06 ENCOUNTER — Encounter (HOSPITAL_COMMUNITY): Payer: Self-pay | Admitting: Internal Medicine

## 2023-01-06 ENCOUNTER — Observation Stay (HOSPITAL_COMMUNITY): Payer: Medicare HMO | Admitting: Certified Registered"

## 2023-01-06 ENCOUNTER — Encounter (HOSPITAL_COMMUNITY)
Admission: EM | Disposition: A | Discharge status: Skilled Nursing Facility | Payer: Self-pay | Source: Skilled Nursing Facility | Attending: Family Medicine

## 2023-01-06 DIAGNOSIS — J449 Chronic obstructive pulmonary disease, unspecified: Secondary | ICD-10-CM | POA: Diagnosis not present

## 2023-01-06 DIAGNOSIS — Z87891 Personal history of nicotine dependence: Secondary | ICD-10-CM

## 2023-01-06 DIAGNOSIS — D509 Iron deficiency anemia, unspecified: Secondary | ICD-10-CM | POA: Diagnosis not present

## 2023-01-06 DIAGNOSIS — K5730 Diverticulosis of large intestine without perforation or abscess without bleeding: Secondary | ICD-10-CM | POA: Diagnosis present

## 2023-01-06 DIAGNOSIS — I10 Essential (primary) hypertension: Secondary | ICD-10-CM | POA: Diagnosis not present

## 2023-01-06 DIAGNOSIS — K2970 Gastritis, unspecified, without bleeding: Secondary | ICD-10-CM

## 2023-01-06 DIAGNOSIS — D62 Acute posthemorrhagic anemia: Secondary | ICD-10-CM | POA: Diagnosis not present

## 2023-01-06 DIAGNOSIS — F32A Depression, unspecified: Secondary | ICD-10-CM | POA: Diagnosis not present

## 2023-01-06 DIAGNOSIS — Z885 Allergy status to narcotic agent status: Secondary | ICD-10-CM | POA: Diagnosis not present

## 2023-01-06 DIAGNOSIS — Z96653 Presence of artificial knee joint, bilateral: Secondary | ICD-10-CM | POA: Diagnosis present

## 2023-01-06 DIAGNOSIS — K317 Polyp of stomach and duodenum: Secondary | ICD-10-CM

## 2023-01-06 DIAGNOSIS — K921 Melena: Secondary | ICD-10-CM | POA: Diagnosis not present

## 2023-01-06 DIAGNOSIS — M8580 Other specified disorders of bone density and structure, unspecified site: Secondary | ICD-10-CM | POA: Diagnosis present

## 2023-01-06 DIAGNOSIS — Z8249 Family history of ischemic heart disease and other diseases of the circulatory system: Secondary | ICD-10-CM | POA: Diagnosis not present

## 2023-01-06 DIAGNOSIS — K2901 Acute gastritis with bleeding: Secondary | ICD-10-CM | POA: Diagnosis not present

## 2023-01-06 DIAGNOSIS — Z886 Allergy status to analgesic agent status: Secondary | ICD-10-CM | POA: Diagnosis not present

## 2023-01-06 DIAGNOSIS — J9611 Chronic respiratory failure with hypoxia: Secondary | ICD-10-CM | POA: Diagnosis not present

## 2023-01-06 DIAGNOSIS — Z85828 Personal history of other malignant neoplasm of skin: Secondary | ICD-10-CM | POA: Diagnosis not present

## 2023-01-06 DIAGNOSIS — K219 Gastro-esophageal reflux disease without esophagitis: Secondary | ICD-10-CM | POA: Diagnosis present

## 2023-01-06 DIAGNOSIS — Z7983 Long term (current) use of bisphosphonates: Secondary | ICD-10-CM | POA: Diagnosis not present

## 2023-01-06 DIAGNOSIS — Z8052 Family history of malignant neoplasm of bladder: Secondary | ICD-10-CM | POA: Diagnosis not present

## 2023-01-06 DIAGNOSIS — F418 Other specified anxiety disorders: Secondary | ICD-10-CM | POA: Diagnosis not present

## 2023-01-06 DIAGNOSIS — Z9981 Dependence on supplemental oxygen: Secondary | ICD-10-CM | POA: Diagnosis not present

## 2023-01-06 DIAGNOSIS — Z961 Presence of intraocular lens: Secondary | ICD-10-CM | POA: Diagnosis present

## 2023-01-06 DIAGNOSIS — E785 Hyperlipidemia, unspecified: Secondary | ICD-10-CM | POA: Diagnosis not present

## 2023-01-06 DIAGNOSIS — Z888 Allergy status to other drugs, medicaments and biological substances status: Secondary | ICD-10-CM | POA: Diagnosis not present

## 2023-01-06 DIAGNOSIS — H409 Unspecified glaucoma: Secondary | ICD-10-CM | POA: Diagnosis present

## 2023-01-06 DIAGNOSIS — F419 Anxiety disorder, unspecified: Secondary | ICD-10-CM | POA: Diagnosis present

## 2023-01-06 HISTORY — PX: ESOPHAGOGASTRODUODENOSCOPY (EGD) WITH PROPOFOL: SHX5813

## 2023-01-06 LAB — BASIC METABOLIC PANEL
Anion gap: 8 (ref 5–15)
BUN: 17 mg/dL (ref 8–23)
CO2: 24 mmol/L (ref 22–32)
Calcium: 8.5 mg/dL — ABNORMAL LOW (ref 8.9–10.3)
Chloride: 109 mmol/L (ref 98–111)
Creatinine, Ser: 0.51 mg/dL (ref 0.44–1.00)
GFR, Estimated: >60 mL/min (ref >60)
Glucose, Bld: 113 mg/dL — ABNORMAL HIGH (ref 70–99)
Potassium: 3.7 mmol/L (ref 3.5–5.1)
Sodium: 141 mmol/L (ref 135–145)

## 2023-01-06 LAB — CBC
HCT: 29 % — ABNORMAL LOW (ref 36.0–46.0)
Hemoglobin: 9 g/dL — ABNORMAL LOW (ref 12.0–15.0)
MCH: 31.1 pg (ref 26.0–34.0)
MCHC: 31 g/dL (ref 30.0–36.0)
MCV: 100.3 fL — ABNORMAL HIGH (ref 80.0–100.0)
Platelets: 142 10*3/uL — ABNORMAL LOW (ref 150–400)
RBC: 2.89 MIL/uL — ABNORMAL LOW (ref 3.87–5.11)
RDW: 13.9 % (ref 11.5–15.5)
WBC: 8 10*3/uL (ref 4.0–10.5)
nRBC: 0 % (ref 0.0–0.2)

## 2023-01-06 SURGERY — ESOPHAGOGASTRODUODENOSCOPY (EGD) WITH PROPOFOL
Anesthesia: Monitor Anesthesia Care

## 2023-01-06 MED ORDER — ROPINIROLE HCL 0.5 MG PO TABS: 0.5000 mg | ORAL_TABLET | Freq: Every evening | ORAL | Status: DC | PRN

## 2023-01-06 MED ORDER — NETARSUDIL DIMESYLATE 0.02 % OP SOLN: 1.0000 [drp] | Freq: Every day | OPHTHALMIC | Status: DC

## 2023-01-06 MED ORDER — DORZOLAMIDE HCL-TIMOLOL MAL 2-0.5 % OP SOLN
1.0000 [drp] | Freq: Two times a day (BID) | OPHTHALMIC | Status: DC
  Administered 2023-01-06 – 2023-01-07 (×2): 1 [drp] via OPHTHALMIC
  Filled 2023-01-06: qty 10

## 2023-01-06 MED ORDER — SODIUM CHLORIDE 0.9 % IV SOLN: INTRAVENOUS | Status: DC

## 2023-01-06 MED ORDER — LATANOPROST 0.005 % OP SOLN
1.0000 [drp] | Freq: Every day | OPHTHALMIC | Status: DC
  Administered 2023-01-06: 1 [drp] via OPHTHALMIC
  Filled 2023-01-06: qty 2.5

## 2023-01-06 MED ORDER — PROPOFOL 500 MG/50ML IV EMUL
INTRAVENOUS | Status: DC | PRN
  Administered 2023-01-06: 50 ug/kg/min via INTRAVENOUS

## 2023-01-06 MED ORDER — PROPOFOL 10 MG/ML IV BOLUS
INTRAVENOUS | Status: DC | PRN
  Administered 2023-01-06: 30 mg via INTRAVENOUS
  Administered 2023-01-06: 20 mg via INTRAVENOUS

## 2023-01-06 MED ORDER — PANTOPRAZOLE SODIUM 40 MG PO TBEC
40.0000 mg | DELAYED_RELEASE_TABLET | Freq: Every day | ORAL | Status: DC
  Administered 2023-01-06: 40 mg via ORAL
  Filled 2023-01-06: qty 1

## 2023-01-06 MED ORDER — LACTATED RINGERS IV SOLN: INTRAVENOUS | Status: DC

## 2023-01-06 MED ORDER — GABAPENTIN 100 MG PO CAPS
100.0000 mg | ORAL_CAPSULE | Freq: Every day | ORAL | Status: DC
  Administered 2023-01-06: 100 mg via ORAL
  Filled 2023-01-06: qty 1

## 2023-01-06 MED ORDER — OXYBUTYNIN CHLORIDE ER 5 MG PO TB24
5.0000 mg | ORAL_TABLET | Freq: Every day | ORAL | Status: DC
  Administered 2023-01-06 – 2023-01-07 (×2): 5 mg via ORAL
  Filled 2023-01-06 (×2): qty 1

## 2023-01-06 MED ORDER — PANTOPRAZOLE SODIUM 40 MG PO TBEC
40.0000 mg | DELAYED_RELEASE_TABLET | Freq: Two times a day (BID) | ORAL | Status: DC
  Administered 2023-01-06 – 2023-01-07 (×2): 40 mg via ORAL
  Filled 2023-01-06 (×2): qty 1

## 2023-01-06 SURGICAL SUPPLY — 15 items

## 2023-01-06 NOTE — Transfer of Care (Signed)
Immediate Anesthesia Transfer of Care Note  Patient: [name]  Procedure(s) Performed: ESOPHAGOGASTRODUODENOSCOPY (EGD) WITH PROPOFOL  Patient Location: PACU  Anesthesia Type:MAC  Level of Consciousness: awake, alert , oriented, and patient cooperative  Airway & Oxygen Therapy: Patient Spontanous Breathing and Patient connected to face mask oxygen  Post-op Assessment: Report given to RN and Post -op Vital signs reviewed and stable  Post vital signs: Reviewed and stable  Last Vitals:  Vitals Value Taken Time  BP    Temp    Pulse    Resp    SpO2      Last Pain:  Vitals:   01/06/23 1116  TempSrc: Temporal  PainSc: 0-No pain         Complications: No notable events documented.

## 2023-01-06 NOTE — Progress Notes (Signed)
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--- has had multiple admissions previously for multifocal pneumonia in 2019 GERD Anxiety depression Prior orthopedic knee repairs  Previous admission 12/18/2020-12/24/2020 diverticular hemorrhage based on CT angio----underwent upper endoscopy previously 05/2021--EGD 2023  gastric polypscolonoscopy 05/2022 showed blood throughout the colon without source diffuse diverticulosis 2 sessile serrated polyps  [name] [facility] 01/04/2022 gradual increase in stools coffee-ground emesis dark blood Hemoglobin 9.8 down from a baseline of 10.0 on 11/04/2022) MCV 99 Platelet 158 BUN/creatinine 22/0.4 LFTs normal  7/8 endoscopy Dr. Ewing Schlein normal esophagus multiple gastric polyps gastritis normal duodenal bulb first part duodenum second part duodenum third part exam otherwise normal  Plan  ?  UGIB possibly secondary to gastritis Continue Protonix 40 daily, soft diet as per GI, saline lock IV She looks quite stable at this time Check hemoglobin in a.m. and if no significant drop, likely can follow as an outpatient and get iron studies etc. and replace at that time  COPD on 2 L Continue oxygen No evidence of wheeze can use an Anora Ellipta, Xopenex every 6 as needed  HTN Continuing metoprolol solution 6.25 twice daily monitor blood pressure trends  Anxiety depression Continues on Zoloft 75 at bedtime, trazodone 25 at bedtime as needed-de-escalate as an outpatient with possible  Knee repairs previously Prior multifocal pneumonia  Discussed the case with the patient's husband on phone --at his request called the son  Dr. Kathlene November Egbertgeneral surgeon in [facility] and updated him  DVT prophylaxis: SCD   Status is: Observation The patient remains OBS appropriate and will d/c before 2 midnights.    Subjective: Looks well History of just returned from endoscopy no fever no chills no nausea Feels a little distended No other real issue No report of any bleeding at this time  Objective + exam Vitals:   01/05/23 1811 01/05/23 2008 01/06/23 0022 01/06/23 0430  BP: 123/61 (!) 123/49 (!) 123/56 (!) 114/54  Pulse: 88 92 84 84  Resp: 16 18 18 18   Temp: 97.9 F (36.6 C) 98 F (36.7 C) 98.3 F (36.8 C) 98.8 F (37.1 C)  TempSrc: Oral Oral Oral Oral  SpO2: 95% 95% 94% 92%  Weight:      Height:       Filed Weights   01/05/23 1422  Weight: 83 kg    Examination:  Pleasant Wf in nad no focal deficit Ct ab no adde sound S1 s2 no m Abd slight distended no rebound no guard Trace LE edema Power 5/5  Data Reviewed: reviewed   CBC    Component Value Date/Time   WBC 8.0 01/06/2023 0401   RBC 2.89 (L) 01/06/2023 0401   HGB 9.0 (L) 01/06/2023 0401   HCT 29.0 (L) 01/06/2023 0401   PLT 142 (L) 01/06/2023 0401   MCV 100.3 (H) 01/06/2023 0401   MCH 31.1 01/06/2023 0401   MCHC 31.0 01/06/2023 0401   RDW 13.9 01/06/2023 0401   LYMPHSABS 1.5 01/05/2023 1120   MONOABS 0.8 01/05/2023 1120   EOSABS 0.1 01/05/2023 1120   BASOSABS 0.0 01/05/2023 1120      Latest Ref Rng &  Units 01/06/2023    4:01 AM 01/05/2023   11:20 AM 10/08/2022   12:00 AM  CMP  Glucose 70 - 99 mg/dL 161  096    BUN 8 - 23 mg/dL 17  22  11       Creatinine 0.44 - 1.00 mg/dL 0.45  4.09  0.5      Sodium 135 - 145 mmol/L 141  138  140      Potassium 3.5 - 5.1 mmol/L 3.7  3.9  4.5      Chloride 98 - 111 mmol/L 109  105  106      CO2 22 - 32 mmol/L 24  25  29       Calcium 8.9 - 10.3 mg/dL 8.5  8.6  9.2      Total Protein 6.5 - 8.1 g/dL  6.5    Total Bilirubin 0.3 - 1.2 mg/dL  0.2    Alkaline Phos 38 - 126 U/L  37  50       AST 15 - 41 U/L  18  15      ALT 0 - 44 U/L  12  10         This result is from an external source.     Scheduled Meds:  dorzolamide-timolol  1 drop Both Eyes BID   gabapentin  100 mg Oral QHS   latanoprost  1 drop Both Eyes QHS   metoprolol tartrate  6.25 mg Oral BID   Netarsudil Dimesylate  1 drop Both Eyes QHS   oxybutynin  5 mg Oral Daily   pantoprazole  40 mg Oral BID   sertraline  75 mg Oral QHS   simvastatin  20 mg Oral QHS   umeclidinium-vilanterol  1 puff Inhalation Daily   Continuous Infusions:    Time  33  Rhetta Mura, MD  [facility]

## 2023-01-06 NOTE — Anesthesia Preprocedure Evaluation (Addendum)
Anesthesia Evaluation  Patient identified by MRN, date of birth, ID band Patient awake    Reviewed: Allergy & Precautions, NPO status , Patient's Chart, lab work & pertinent test results  Airway Mallampati: II  TM Distance: >3 FB Neck ROM: Full    Dental  (+) Missing   Pulmonary COPD,  COPD inhaler and oxygen dependent, former smoker   Pulmonary exam normal        Cardiovascular hypertension, Pt. 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      Neuro/Psych  Headaches PSYCHIATRIC DISORDERS Anxiety Depression       GI/Hepatic Neg liver ROS,GERD  Medicated and Controlled,,  Endo/Other  negative endocrine ROS    Renal/GU negative Renal ROS     Musculoskeletal  (+) Arthritis ,    Abdominal   Peds  Hematology  (+) Blood dyscrasia, anemia   Anesthesia Other Findings Melena  Reproductive/Obstetrics                             Anesthesia Physical Anesthesia Plan  ASA: 4  Anesthesia Plan: MAC   Post-op Pain Management:    Induction: Intravenous  PONV Risk Score and Plan: 2 and Propofol infusion and Treatment may vary due to age or medical condition  Airway Management Planned: Nasal Cannula  Additional Equipment:   Intra-op Plan:   Post-operative Plan:   Informed Consent: I have reviewed the patients History and Physical, chart, labs and discussed the procedure including the risks, benefits and alternatives for the proposed anesthesia with the patient or authorized representative who has indicated his/her understanding and acceptance.     Dental advisory given  Plan Discussed with: CRNA  Anesthesia Plan Comments:        Anesthesia Quick Evaluation

## 2023-01-06 NOTE — Progress Notes (Signed)
Mobility Specialist - Progress Note   01/06/23 0851  Mobility  Activity Ambulated with assistance in hallway;Ambulated with assistance in room;Transferred to/from [facility]  Level of Assistance Contact guard assist, steadying assist  Assistive Device Front wheel walker  Distance Ambulated (ft) 60 ft  Range of Motion/Exercises Active  Activity Response Tolerated well  Mobility Referral Yes  $Mobility charge 1 Mobility  Mobility Specialist Start Time (ACUTE ONLY) [id number]  Mobility Specialist Stop Time (ACUTE ONLY) 0951  Mobility Specialist Time Calculation (min) (ACUTE ONLY) 26 min   Pt was found in bed and agreeable to ambulation. Was assisted to [facility] prior to hallway ambulation. Needs cues to avoid hitting obstacles in hallway. At EOS returned to bed with all needs met. Call bell in reach.  Billey Chang Mobility Specialist

## 2023-01-06 NOTE — Anesthesia Postprocedure Evaluation (Signed)
Anesthesia Post Note  Patient: Garry Heater Lauver  Procedure(s) Performed: ESOPHAGOGASTRODUODENOSCOPY (EGD) WITH PROPOFOL     Patient location during evaluation: Endoscopy Anesthesia Type: MAC Level of consciousness: awake Pain management: pain level controlled Vital Signs Assessment: post-procedure vital signs reviewed and stable Respiratory status: spontaneous breathing, nonlabored ventilation and respiratory function stable Cardiovascular status: blood pressure returned to baseline and stable Postop Assessment: no apparent nausea or vomiting Anesthetic complications: no   No notable events documented.  Last Vitals:  Vitals:   01/06/23 1258 01/06/23 1342  BP: (!) 110/55 (!) 104/58  Pulse: 66 66  Resp: 18 14  Temp: 36.6 C 36.5 C  SpO2: 100% 97%    Last Pain:  Vitals:   01/06/23 1342  TempSrc: Oral  PainSc:                  Campbell Kray P Skie Vitrano

## 2023-01-06 NOTE — Op Note (Signed)
[facility] Patient Name: [name] Procedure Date: 01/06/2023 MRN: [id number] Attending MD: Vida Rigger , MD, [id number] Date of Birth: [birth year] CSN: [id number] Age: 82 Admit Type: Inpatient Procedure:                Upper GI endoscopy Indications:              Melena Providers:                Vida Rigger, MD, Lorenza Evangelist, RN, Eliberto Ivory,                            RN, Kandice Robinsons, Technician Referring MD:              Medicines:                Monitored Anesthesia Care Complications:            No immediate complications. Estimated Blood Loss:     Estimated blood loss: none. Procedure:                Pre-Anesthesia Assessment:                           - Prior to the procedure, a History and Physical                            was performed, and patient medications and                            allergies were reviewed. The patient's tolerance of                            previous anesthesia was also reviewed. The risks                            and benefits of the procedure and the sedation                            options and risks were discussed with the patient.                            All questions were answered, and informed consent                            was obtained. Prior Anticoagulants: The patient has                            taken no anticoagulant or antiplatelet agents. ASA                            Grade Assessment: III - A patient with severe                            systemic disease. After reviewing the risks and  benefits, the patient was deemed in satisfactory                            condition to undergo the procedure.                           After obtaining informed consent, the endoscope was                            passed under direct vision. Throughout the                            procedure, the patient's blood pressure, pulse, and                            oxygen saturations were  monitored continuously. The                            [id number]-[id number] ([id number]) Olympus endoscope was introduced                            through the mouth, and advanced to the fourth part                            of duodenum. The upper GI endoscopy was                            accomplished without difficulty. The patient                            tolerated the procedure well. Scope In: Scope Out: Findings:      The examined esophagus was normal.      Multiple diminutive semi-sessile polyps with no stigmata of recent       bleeding were found in the gastric body.      Localized mild inflammation characterized by erythema was found in the       gastric antrum.      The duodenal bulb, first portion of the duodenum, second portion of the       duodenum, third portion of the duodenum and fourth portion of the       duodenum were normal.      The exam was otherwise without abnormality. Impression:               - Normal esophagus.                           - Multiple gastric polyps.                           - Gastritis.                           - Normal duodenal bulb, first portion of the                            duodenum, second portion of the  duodenum, third                            portion of the duodenum and fourth portion of the                            duodenum.                           - The examination was otherwise normal.                           - No specimens collected. Moderate Sedation:      Not Applicable - Patient had care per Anesthesia. Recommendation:           - Soft diet today. Hopefully if no further bleeding                            can go home soon and since she had a colonoscopy                            last year okay to hold off on further workup at                            this time                           - Continue present medications.                           - Return to GI clinic PRN.                           - Telephone GI clinic if  symptomatic PRN. Procedure Code(s):        --- Professional ---                           [phone], Esophagogastroduodenoscopy, flexible,                            transoral; diagnostic, including collection of                            specimen(s) by brushing or washing, when performed                            (separate procedure) Diagnosis Code(s):        --- Professional ---                           K31.7, Polyp of stomach and duodenum                           K29.70, Gastritis, unspecified, without bleeding                           K92.1,  Melena (includes Hematochezia) CPT copyright 2022 American [employer] Association. All rights reserved. The codes documented in this report are preliminary and upon coder review may  be revised to meet current compliance requirements. Vida Rigger, MD 01/06/2023 12:49:17 PM This report has been signed electronically. Number of Addenda: 0

## 2023-01-06 NOTE — Progress Notes (Signed)
[name] 11:39 AM  Subjective: Patient doing well without signs of any more bleeding and her hospital computer chart was reviewed and her case discussed with my partner Dr. Bosie Clos and we rediscussed the endoscopy and answered all of her questions  Objective: Vital signs stable afebrile no acute distress exam please see preassessment evaluation BUN and creatinine okay hemoglobin slight drop  Assessment: GI bleeding questionable etiology  Plan: Okay to proceed with endoscopy which we rediscussed with anesthesia assistance  [facility]  office [phone] After 5PM or if no answer call [phone]

## 2023-01-07 DIAGNOSIS — K921 Melena: Secondary | ICD-10-CM | POA: Diagnosis not present

## 2023-01-07 LAB — CBC WITH DIFFERENTIAL/PLATELET
Abs Immature Granulocytes: 0.02 10*3/uL (ref 0.00–0.07)
Basophils Absolute: 0 10*3/uL (ref 0.0–0.1)
Basophils Relative: 1 %
Eosinophils Absolute: 0.2 10*3/uL (ref 0.0–0.5)
Eosinophils Relative: 3 %
HCT: 28.6 % — ABNORMAL LOW (ref 36.0–46.0)
Hemoglobin: 8.9 g/dL — ABNORMAL LOW (ref 12.0–15.0)
Immature Granulocytes: 0 %
Lymphocytes Relative: 30 %
Lymphs Abs: 1.8 10*3/uL (ref 0.7–4.0)
MCH: 31.3 pg (ref 26.0–34.0)
MCHC: 31.1 g/dL (ref 30.0–36.0)
MCV: 100.7 fL — ABNORMAL HIGH (ref 80.0–100.0)
Monocytes Absolute: 0.7 10*3/uL (ref 0.1–1.0)
Monocytes Relative: 12 %
Neutro Abs: 3.3 10*3/uL (ref 1.7–7.7)
Neutrophils Relative %: 54 %
Platelets: 137 10*3/uL — ABNORMAL LOW (ref 150–400)
RBC: 2.84 MIL/uL — ABNORMAL LOW (ref 3.87–5.11)
RDW: 14 % (ref 11.5–15.5)
WBC: 6 10*3/uL (ref 4.0–10.5)
nRBC: 0 % (ref 0.0–0.2)

## 2023-01-07 LAB — COMPREHENSIVE METABOLIC PANEL
ALT: 10 U/L (ref 0–44)
AST: 15 U/L (ref 15–41)
Albumin: 3.3 g/dL — ABNORMAL LOW (ref 3.5–5.0)
Alkaline Phosphatase: 31 U/L — ABNORMAL LOW (ref 38–126)
Anion gap: 7 (ref 5–15)
BUN: 12 mg/dL (ref 8–23)
CO2: 25 mmol/L (ref 22–32)
Calcium: 8.4 mg/dL — ABNORMAL LOW (ref 8.9–10.3)
Chloride: 110 mmol/L (ref 98–111)
Creatinine, Ser: 0.51 mg/dL (ref 0.44–1.00)
GFR, Estimated: >60 mL/min (ref >60)
Glucose, Bld: 114 mg/dL — ABNORMAL HIGH (ref 70–99)
Potassium: 3.3 mmol/L — ABNORMAL LOW (ref 3.5–5.1)
Sodium: 142 mmol/L (ref 135–145)
Total Bilirubin: 0.5 mg/dL (ref 0.3–1.2)
Total Protein: 5.8 g/dL — ABNORMAL LOW (ref 6.5–8.1)

## 2023-01-07 MED ORDER — POTASSIUM CHLORIDE CRYS ER 20 MEQ PO TBCR
40.0000 meq | EXTENDED_RELEASE_TABLET | Freq: Every day | ORAL | Status: DC
  Administered 2023-01-07: 40 meq via ORAL
  Filled 2023-01-07: qty 2

## 2023-01-07 MED ORDER — POTASSIUM CHLORIDE CRYS ER 20 MEQ PO TBCR: 40.0000 meq | EXTENDED_RELEASE_TABLET | Freq: Every day | ORAL | 0 refills | Status: DC

## 2023-01-07 MED ORDER — SERTRALINE HCL 50 MG PO TABS: 75.0000 mg | ORAL_TABLET | Freq: Every day | ORAL | 0 refills | Status: AC

## 2023-01-07 MED ORDER — GABAPENTIN 100 MG PO CAPS
100.0000 mg | ORAL_CAPSULE | Freq: Every day | ORAL | 0 refills | Status: AC
Start: 2023-01-07 — End: ?

## 2023-01-07 NOTE — [place] FL2 (Signed)
[place] MEDICAID FL2 LEVEL OF CARE FORM     IDENTIFICATION  Patient Name: [name] Birthdate: [birth year] Sex: female Admission Date (Current Location): 01/05/2023  [facility] and [place] Number:  Producer, television/film/video and Address:  [facility],  [address] [place], [place] [address]      Provider Number: [id number]  Attending Physician Name and Address:  Rhetta Mura, MD  Relative Name and Phone Number:       Current Level of Care: Hospital Recommended Level of Care: Assisted Living Facility Prior Approval Number:    Date Approved/Denied:   PASRR Number:    Discharge Plan: Other (Comment) (ALF)    Current Diagnoses: Patient Active Problem List   Diagnosis Date Noted   Melena 01/05/2023   Hypotension 05/02/2022   UTI (urinary tract infection) 02/05/2022   Restless leg syndrome 02/05/2022   Senile osteoporosis 02/05/2022   Hypertension 02/05/2022   Pulmonary nodule 1 cm or greater in diameter 02/05/2022   GERD (gastroesophageal reflux disease) 02/05/2022   GI bleed 01/07/2022   Iron deficiency anemia 05/02/2021   Symptomatic anemia    DOE (dyspnea on exertion) 11/18/2017   COPD GOLD II  11/18/2017   Diverticulosis of intestine with bleeding 09/05/2017   Influenza A 08/08/2017   Acute respiratory failure with hypoxia (HCC) 08/07/2017   Depression with anxiety 08/07/2017   Hyperlipemia 08/07/2017   Macular degeneration 08/07/2017   Glaucoma 08/07/2017   Weakness 08/07/2017   Dehydration 08/07/2017   Lower GI bleeding 10/14/2016   Diverticulosis of colon 10/14/2016   Anxiety 10/14/2016   Lower GI bleed 10/14/2016   Acute GI bleeding    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   Dermatochalasis of both upper eyelids 08/17/2014   Osteoarthritis, multiple sites 12/20/2011    Orientation RESPIRATION BLADDER Height & Weight     Self, Time, Situation, Place  Normal Continent Weight: 183 lb (83 kg) Height:  5\' 1"  (154.9 cm)   BEHAVIORAL SYMPTOMS/MOOD NEUROLOGICAL BOWEL NUTRITION STATUS      Continent Diet (regular)  AMBULATORY STATUS COMMUNICATION OF NEEDS Skin   Supervision Verbally Normal                       Personal Care Assistance Level of Assistance  Bathing, Dressing Bathing Assistance:  (supervision)   Dressing Assistance: Independent     Functional Limitations Info  Sight, Hearing, Speech Sight Info: Adequate Hearing Info: Adequate Speech Info: Adequate    SPECIAL CARE FACTORS FREQUENCY                       Contractures Contractures Info: Not present    Additional Factors Info  Code Status, Allergies, Psychotropic Code Status Info: Full Allergies Info: Morphine And Codeine, Aspirin, Duloxetine Hcl, Tymlos (Abaloparatide), Ventolin (Albuterol), Cefadroxil Psychotropic Info: see MAR         Current Medications (01/07/2023):  This is the current hospital active medication list Current Facility-Administered Medications  Medication Dose Route Frequency Provider Last Rate Last Admin   acetaminophen (TYLENOL) tablet 650 mg  650 mg Oral Q6H PRN Vida Rigger, MD   650 mg at 01/06/23 0123   Or   acetaminophen (TYLENOL) suppository 650 mg  650 mg Rectal Q6H PRN Vida Rigger, MD       dorzolamide-timolol (COSOPT) 2-0.5 % ophthalmic solution 1 drop  1 drop Both Eyes BID Rhetta Mura, MD   1 drop at 01/07/23 0923   gabapentin (NEURONTIN) capsule  100 mg  100 mg Oral QHS Rhetta Mura, MD   100 mg at 01/06/23 2131   latanoprost (XALATAN) 0.005 % ophthalmic solution 1 drop  1 drop Both Eyes QHS Rhetta Mura, MD   1 drop at 01/06/23 2132   levalbuterol (XOPENEX) nebulizer solution 0.63 mg  0.63 mg Nebulization Q6H PRN Vida Rigger, MD       lip balm (CARMEX) ointment 1 Application  1 Application Topical PRN Vida Rigger, MD       metoprolol tartrate (LOPRESSOR) 25 mg/10 mL oral suspension 6.25 mg  6.25 mg Oral BID Vida Rigger, MD   6.25 mg at 01/07/23 0923   Netarsudil  Dimesylate 0.02 % SOLN 1 drop  1 drop Both Eyes QHS Rhetta Mura, MD       ondansetron (ZOFRAN) tablet 4 mg  4 mg Oral Q6H PRN Vida Rigger, MD       Or   ondansetron [facility]) injection 4 mg  4 mg Intravenous Q6H PRN Vida Rigger, MD       oxybutynin (DITROPAN-XL) 24 hr tablet 5 mg  5 mg Oral Daily Rhetta Mura, MD   5 mg at 01/07/23 1610   pantoprazole (PROTONIX) EC tablet 40 mg  40 mg Oral BID Rhetta Mura, MD   40 mg at 01/07/23 9604   potassium chloride SA (KLOR-CON M) CR tablet 40 mEq  40 mEq Oral Daily Rhetta Mura, MD   40 mEq at 01/07/23 0941   rOPINIRole (REQUIP) tablet 0.5 mg  0.5 mg Oral QHS PRN Rhetta Mura, MD       sertraline (ZOLOFT) tablet 75 mg  75 mg Oral QHS Vida Rigger, MD   75 mg at 01/06/23 2131   simvastatin (ZOCOR) tablet 20 mg  20 mg Oral QHS Vida Rigger, MD   20 mg at 01/06/23 2132   traZODone (DESYREL) tablet 25 mg  25 mg Oral QHS PRN Vida Rigger, MD   25 mg at 01/06/23 0122   umeclidinium-vilanterol (ANORO ELLIPTA) 62.5-25 MCG/ACT 1 puff  1 puff Inhalation Daily Vida Rigger, MD   1 puff at 01/07/23 0749     Discharge Medications: Please see discharge summary for a list of discharge medications.  Relevant Imaging Results:  Relevant Lab Results:   Additional Information SS# [id number]  Mystery Schrupp, LCSW

## 2023-01-07 NOTE — Plan of Care (Signed)

## 2023-01-07 NOTE — Discharge Summary (Signed)
Physician Discharge Summary  [name] ZOX:[id number] DOB: 22-Nov-1940 DOA: 01/05/2023  PCP: Betty Heir, NP  Admit date: 01/05/2023 Discharge date: 01/07/2023  Time spent: 40 minutes  Recommendations for Outpatient Follow-up:  Recommend a soft diet with graduation to regular diet over the next 1 to 2 days Recommend outpatient recheck of potassium, magnesium, CBC in about 1 week Would avoid NSAIDs and other meds with propensity to cause bleeding in the outpatient Suggest de-escalation off of some psychotropics for sleep etc. given her advanced age May require IV iron as an outpatient and would get iron studies in about 3 to 4 weeks  Discharge Diagnoses:  MAIN problem for hospitalization   Probable acute gastritis causing mild GI bleed upper Anemia of iron deficiency likely   Please see below for itemized issues addressed in [facility]ps[facility]- refer to other progress notes for clarity if needed  Discharge Condition: Improved  Diet recommendation: Heart healthy  Filed Weights   01/05/23 1422  Weight: 83 kg    History of present illness:  82 year old white female Recurrent diverticulosis COPD on chronic hypoxic respiratory failure 2 L/min--- has had multiple admissions previously for multifocal pneumonia in 2019 GERD Anxiety depression Prior orthopedic knee repairs   Previous admission 12/18/2020-12/24/2020 diverticular hemorrhage based on CT angio----underwent upper endoscopy previously 05/2021--EGD 2023  gastric polypscolonoscopy 05/2022 showed blood throughout the colon without source diffuse diverticulosis 2 sessile serrated polyps   [name] [facility] gradual increase in stools coffee-ground emesis dark blood Hemoglobin 9.8 down from a baseline of 10.0 on 11/04/2022) MCV 99 Platelet 158 BUN/creatinine 22/0.4 LFTs normal   7/8 endoscopy Dr. Ewing Schlein normal esophagus multiple gastric polyps gastritis normal duodenal bulb first part  duodenum second part duodenum third part exam otherwise normal  Hospital Course:    UGIB 2/2 gastritis Discharging on Protonix daily, can graduate diet to soft and may be full in the outpatient setting hemoglobin stabilized during hospital stay at 8.9 and will repeat as an outpatient at her facility  Mild anemia of acute blood loss from gastritis Suggest outpatient iron studies to be done  COPD on 2 L Continue oxygen No evidence of wheeze can use an Anora Ellipta, Xopenex every 6 as needed   HTN Continuing metoprolol solution 6.25 twice daily monitor blood pressure trends   Anxiety depression Continues on Zoloft 75 at bedtime, trazodone 25 at bedtime as needed-de-escalate as an outpatient with possible   Knee repairs previously Prior multifocal pneumonia   Discharge Exam: Vitals:   01/06/23 2101 01/07/23 0631  BP: 115/63 138/65  Pulse: 83 86  Resp: 18 18  Temp: 98.6 F (37 C) 98.7 F (37.1 C)  SpO2: 94% 93%    Subj on day of d/c   Awake coherent no distress good spirits tolerated soft diet  General Exam on discharge  Kyphotic white female no distress looks well feels well Chest clear no added sound S1-S2 no murmur Abdomen obese nontender no rebound No lower extremity edema ROM intact  Discharge Instructions    Allergies as of 01/07/2023       Reactions   Morphine And Codeine Itching   Aspirin Other (See Comments)   Unknown reaction   Duloxetine Hcl Other (See Comments)   Unknown reaction   Tymlos [abaloparatide] Other (See Comments)   Unknown reaction   Ventolin [albuterol] Other (See Comments)   rapid heart beat   Cefadroxil Hives   Patient can take amoxicillin and cipro  Medication List     TAKE these medications    acetaminophen 650 MG CR tablet Commonly known as: TYLENOL Take 650 mg by mouth in the morning and at bedtime.   albuterol 108 (90 Base) MCG/ACT inhaler Commonly known as: VENTOLIN HFA Inhale 2 puffs into the lungs  every 4 (four) hours as needed for wheezing or shortness of breath.   alendronate 70 MG tablet Commonly known as: FOSAMAX Take 70 mg by mouth every Sunday. Take with a full glass of water on an empty stomach.   Align 4 MG Caps Take 4 mg by mouth at bedtime.   Anoro Ellipta 62.5-25 MCG/ACT Aepb Generic drug: umeclidinium-vilanterol Inhale 1 puff into the lungs every morning.   cetirizine 10 MG tablet Commonly known as: ZYRTEC Take 1 tablet (10 mg total) by mouth at bedtime.   docusate sodium 100 MG capsule Commonly known as: COLACE Take 200 mg by mouth at bedtime.   dorzolamide-timolol 2-0.5 % ophthalmic solution Commonly known as: COSOPT Place 1 drop into both eyes 2 (two) times daily.   ferrous gluconate 240 (27 FE) MG tablet Commonly known as: FERGON Take 240 mg by mouth every morning. Take without food   gabapentin 100 MG capsule Commonly known as: NEURONTIN Take 1 capsule (100 mg total) by mouth at bedtime.   latanoprost 0.005 % ophthalmic solution Commonly known as: XALATAN Place 1 drop into both eyes at bedtime.   METOPROLOL TARTRATE PO Take 6.25 mg by mouth in the morning and at bedtime. What changed: Another medication with the same name was removed. Continue taking this medication, and follow the directions you see here.   oxybutynin 5 MG 24 hr tablet Commonly known as: DITROPAN-XL Take 5 mg by mouth daily. What changed: Another medication with the same name was removed. Continue taking this medication, and follow the directions you see here.   pantoprazole 40 MG tablet Commonly known as: PROTONIX Take 40 mg by mouth 2 (two) times daily.   polyethylene glycol 17 g packet Commonly known as: MIRALAX / GLYCOLAX Take 8.5 g by mouth daily as needed (constipation).   potassium chloride SA 20 MEQ tablet Commonly known as: KLOR-CON M Take 2 tablets (40 mEq total) by mouth daily.   PreserVision AREDS 2 Caps Take 1 capsule by mouth 2 (two) times daily.    PreviDent 5000 Booster Plus 1.1 % Pste Generic drug: Sodium Fluoride Place 1 Application onto teeth See admin instructions. Brush on teeth with a toothbrush after evening mouth care. Spit out excess and do not rinse.   Rhopressa 0.02 % Soln Generic drug: Netarsudil Dimesylate Place 1 drop into both eyes at bedtime.   rOPINIRole 0.5 MG tablet Commonly known as: REQUIP Take 0.5 mg by mouth as needed.   sertraline 50 MG tablet Commonly known as: ZOLOFT Take 1.5 tablets (75 mg total) by mouth daily.   simvastatin 20 MG tablet Commonly known as: ZOCOR Take 20 mg by mouth every evening.   vitamin C 1000 MG tablet Take 1,000 mg by mouth every morning.   Vitamin D3 50 MCG (2000 UT) Tabs Take 2,000 Units by mouth every morning.   zinc oxide 20 % ointment Apply 1 Application topically See admin instructions. Apply topically to buttocks after every incontinent episode and as needed for redness       Allergies  Allergen Reactions   Morphine And Codeine Itching   Aspirin Other (See Comments)    Unknown reaction   Duloxetine Hcl Other (See Comments)  Unknown reaction   Tymlos [Abaloparatide] Other (See Comments)    Unknown reaction   Ventolin [Albuterol] Other (See Comments)    rapid heart beat   Cefadroxil Hives    Patient can take amoxicillin and cipro      The results of significant diagnostics from this hospitalization (including imaging, microbiology, ancillary and laboratory) are listed below for reference.    Significant Diagnostic Studies: DG Chest Port 1 View  Result Date: 01/05/2023 CLINICAL DATA:  Dark red stool EXAM: PORTABLE CHEST 1 VIEW COMPARISON:  None Available. FINDINGS: Normal mediastinum and cardiac silhouette. Normal pulmonary vasculature. No evidence of effusion, infiltrate, or pneumothorax. No acute bony abnormality. IMPRESSION: No acute cardiopulmonary process. Electronically Signed   By: Genevive Bi M.D.   On: 01/05/2023 11:27     Microbiology: No results found for this or any previous visit (from the past 240 hour(s)).   Labs: Basic Metabolic Panel: Recent Labs  Lab 01/05/23 1120 01/06/23 0401 01/07/23 0506  NA 138 141 142  K 3.9 3.7 3.3*  CL 105 109 110  CO2 25 24 25   GLUCOSE 110* 113* 114*  BUN 22 17 12   CREATININE 0.46 0.51 0.51  CALCIUM 8.6* 8.5* 8.4*   Liver Function Tests: Recent Labs  Lab 01/05/23 1120 01/07/23 0506  AST 18 15  ALT 12 10  ALKPHOS 37* 31*  BILITOT 0.2* 0.5  PROT 6.5 5.8*  ALBUMIN 3.5 3.3*   No results for input(s): "LIPASE", "AMYLASE" in the last 168 hours. No results for input(s): "AMMONIA" in the last 168 hours. CBC: Recent Labs  Lab 01/05/23 1120 01/06/23 0401 01/07/23 0506  WBC 8.0 8.0 6.0  NEUTROABS 5.6  --  3.3  HGB 9.8* 9.0* 8.9*  HCT 31.1* 29.0* 28.6*  MCV 99.7 100.3* 100.7*  PLT 158 142* 137*   Cardiac Enzymes: No results for input(s): "CKTOTAL", "CKMB", "CKMBINDEX", "TROPONINI" in the last 168 hours. BNP: BNP (last 3 results) No results for input(s): "BNP" in the last 8760 hours.  ProBNP (last 3 results) No results for input(s): "PROBNP" in the last 8760 hours.  CBG: No results for input(s): "GLUCAP" in the last 168 hours.     Signed:  Rhetta Mura MD   [facility] 01/07/2023, 9:31 AM

## 2023-01-07 NOTE — TOC Transition Note (Signed)
Transition of Care [facility]) - CM/SW Discharge Note   Patient Details  Name: [name] MRN: [id number] Date of Birth: 1940/08/24  Transition of Care [facility]) CM/SW Contact:  Amada Jupiter, LCSW Phone Number: 01/07/2023, 1:31 PM   Clinical Narrative:     Alerted today that pt has been medically cleared for return to her ALF apt at [facility]. Have spoken with facility and pt's spouse and all are aware and agreeable.  Have provided updated FL2 and dc summary to facility.  [facility] transportation will pick up pt @ 2:15pm and RN aware.  No further TOC needs.  Final next level of care: Assisted Living [facility]) Barriers to Discharge: No Barriers Identified   Patient Goals and CMS Choice      Discharge Placement                  Patient to be transferred to facility by: [facility] to provide transportation Name of family member notified: spouse    Discharge Plan and Services Additional resources added to the After Visit Summary for                  DME Arranged: N/A DME Agency: NA                  Social Determinants of Health (SDOH) Interventions SDOH Screenings   Food Insecurity: No Food Insecurity (01/05/2023)  Housing: Low Risk  (01/05/2023)  Transportation Needs: No Transportation Needs (01/05/2023)  Utilities: Not At Risk (01/05/2023)  Alcohol Screen: Low Risk  (03/19/2022)  Depression (PHQ2-9): Medium Risk (12/20/2022)  Financial Resource Strain: Low Risk  (03/19/2022)  Physical Activity: Insufficiently Active (03/19/2022)  Social Connections: Moderately Isolated (03/19/2022)  Stress: No Stress Concern Present (03/19/2022)  Tobacco Use: Medium Risk (01/06/2023)     Readmission Risk Interventions    01/07/2023    1:24 PM 05/10/2021   11:32 AM  Readmission Risk Prevention Plan  Transportation Screening Complete Complete  PCP or Specialist Appt within 5-7 Days Complete   PCP or Specialist Appt within 3-5 Days  Complete  Home Care Screening  Complete   Medication Review (RN CM) Complete   HRI or Home Care Consult  Complete  Social Work Consult for Recovery Care Planning/Counseling  Patient refused  Palliative Care Screening  Not Applicable  Medication Review Oceanographer)  Complete

## 2023-01-08 ENCOUNTER — Non-Acute Institutional Stay: Payer: Medicare HMO | Admitting: Orthopedic Surgery

## 2023-01-08 ENCOUNTER — Encounter: Payer: Self-pay | Admitting: Orthopedic Surgery

## 2023-01-08 DIAGNOSIS — F339 Major depressive disorder, recurrent, unspecified: Secondary | ICD-10-CM

## 2023-01-08 DIAGNOSIS — M25561 Pain in right knee: Secondary | ICD-10-CM

## 2023-01-08 DIAGNOSIS — M25562 Pain in left knee: Secondary | ICD-10-CM | POA: Diagnosis not present

## 2023-01-08 DIAGNOSIS — K921 Melena: Secondary | ICD-10-CM

## 2023-01-08 DIAGNOSIS — D500 Iron deficiency anemia secondary to blood loss (chronic): Secondary | ICD-10-CM

## 2023-01-08 DIAGNOSIS — I10 Essential (primary) hypertension: Secondary | ICD-10-CM

## 2023-01-08 DIAGNOSIS — J449 Chronic obstructive pulmonary disease, unspecified: Secondary | ICD-10-CM | POA: Diagnosis not present

## 2023-01-08 DIAGNOSIS — G8929 Other chronic pain: Secondary | ICD-10-CM | POA: Diagnosis not present

## 2023-01-08 NOTE — Progress Notes (Signed)
Location:  [facility] Room Number: 20-A Place of Service:  [phone]) Provider: Hazle Nordmann, NP  Code Status: FULL CODE Goals of Care:     01/08/2023    9:54 AM  Advanced Directives  Does Patient Have a Medical Advance Directive? No  Would patient like information on creating a medical advance directive? No - Patient declined     Chief Complaint  Patient presents with   Hospitalization Follow-up    HPI: Patient is a 82 y.o. female seen today for hospital follow-up s/p admission from [facility] 07/07- 07/09.   She currently resides on the assisted living unit at [facility]. PMH: HTN, COPD, pulmonary nodule, Diverticulosis with GI bleed, GERD, OA, osteoporosis,macular degeneration, iron def anemia, unstable gait, anxiety, and depression.   "H/o diverticular bleed 11/2020 s/p embolization with recurrence 05/2021. Hospitalized 05/2022 due to ongoing rectal bleeding." 04/23 rectal bleeding returned but she was not hospitalized. Hgb dropped from 12.3 to 8.9 during this time. Hgb 12.1 (06/24)> was 10.0 (05/06)> was 8.9 (04/29). She is currently taking Protonix daily."   07/06 nursing reported bloody stools. 07/07 she continued to have bloody stools,  on call provider recommended stat cbc/diff, family wanted ED evaluation. ED labs: Hgb 9.8> was 12.1, platelets 158, BUN/creat 22/0.4, LFT normal. 07/08 she underwent endoscopy with Dr. Ewing Schlein. Multiple gastric polyps and gastritis noted. She was on Protonix drip for a brief period. Bleeding subsided during hospitalization. She was discharged back to [facility], advised to slowly advance diet, avoid NSAIDS, recheck K+, magnesium, cbc/diff in 1 week. Repeat iron studies recommended in 3-4 weeks.    Today, she denies abdominal pain or bloody stools. She reports having a black stool this morning. She is taking ferrous gluconate due to blood loss anemia. Admits to feeling fatigued. Denies chest pain, sob, and dizziness.  Appetite fair. Ambulating well with walker. She is enrolled with PT.   She was very upset to have went to the hospital. We discussed treatment options in case this occurs again. She is requesting blood work be done first prior to hospitalization.   Past Medical History:  Diagnosis Date   Anxiety    Arthritis    "bilateral knees, shoulders, elbows; neck, pretty widespread" (09/05/2017)   BPPV (benign paroxysmal positional vertigo)    Depression    GERD (gastroesophageal reflux disease)    Glaucoma, both eyes    Headache    "probably 2/month" (09/05/2017)   History of blood transfusion ~ 2008   "related to LGIB"   Hyperlipemia    Lower GI bleeding ~ 2008; 09/05/2017   "had to have blood transfusion"   Macular degeneration, bilateral    Osteopenia    Seasonal allergies    Skin cancer, basal cell 2001   "off my nose, left side"   Sleeping excessive    Tinnitus of both ears     Past Surgical History:  Procedure Laterality Date   BALLOON DILATION N/A 05/08/2021   Procedure: BALLOON DILATION;  Surgeon: Kerin Salen, MD;  Location: [facility] ENDOSCOPY;  Service: Gastroenterology;  Laterality: N/A;   BASAL CELL CARCINOMA EXCISION  2001   "off my nose, left side"   BIOPSY  05/08/2021   Procedure: BIOPSY;  Surgeon: Kerin Salen, MD;  Location: [facility] ENDOSCOPY;  Service: Gastroenterology;;   BLEPHAROPLASTY Bilateral    CATARACT EXTRACTION W/ INTRAOCULAR LENS  IMPLANT, BILATERAL Bilateral 1990's   COLONOSCOPY WITH PROPOFOL N/A 05/04/2022   Procedure: COLONOSCOPY WITH PROPOFOL;  Surgeon:  Kerin Salen, MD;  Location: [facility] ENDOSCOPY;  Service: Gastroenterology;  Laterality: N/A;   ESOPHAGOGASTRODUODENOSCOPY (EGD) WITH PROPOFOL N/A 05/08/2021   Procedure: ESOPHAGOGASTRODUODENOSCOPY (EGD) WITH PROPOFOL;  Surgeon: Kerin Salen, MD;  Location: [facility] ENDOSCOPY;  Service: Gastroenterology;  Laterality: N/A;   ESOPHAGOGASTRODUODENOSCOPY (EGD) WITH PROPOFOL N/A 01/11/2022   Procedure: ESOPHAGOGASTRODUODENOSCOPY (EGD) WITH  PROPOFOL;  Surgeon: Vida Rigger, MD;  Location: [facility] ENDOSCOPY;  Service: Gastroenterology;  Laterality: N/A;   EYE SURGERY Bilateral    "to improve vision after cataract OR"   IR ANGIOGRAM FOLLOW UP STUDY  12/19/2020   IR ANGIOGRAM SELECTIVE EACH ADDITIONAL VESSEL  12/19/2020   IR ANGIOGRAM SELECTIVE EACH ADDITIONAL VESSEL  12/19/2020   IR ANGIOGRAM VISCERAL SELECTIVE  12/19/2020   IR EMBO ART  VEN HEMORR LYMPH EXTRAV  INC GUIDE ROADMAPPING  12/19/2020   IR US GUIDE VASC ACCESS RIGHT  12/19/2020   JOINT REPLACEMENT     POLYPECTOMY  05/04/2022   Procedure: POLYPECTOMY;  Surgeon: Kerin Salen, MD;  Location: [facility] ENDOSCOPY;  Service: Gastroenterology;;   STAPEDES SURGERY Left    "scraped stapedes because it was sticking when it wasn't suppose to"   TONSILLECTOMY AND ADENOIDECTOMY  1946   TOTAL KNEE ARTHROPLASTY Left ~ 2008   TOTAL KNEE ARTHROPLASTY  12/20/2011   Procedure: TOTAL KNEE ARTHROPLASTY;  Surgeon: Verlee Rossetti, MD;  Location: [facility] OR;  Service: Orthopedics;  Laterality: Right;  Right Total Knee Arthroplasty   TUBAL LIGATION  1980's    Allergies  Allergen Reactions   Morphine And Codeine Itching   Aspirin Other (See Comments)    Unknown reaction   Duloxetine Hcl Other (See Comments)    Unknown reaction   Tymlos [Abaloparatide] Other (See Comments)    Unknown reaction   Ventolin [Albuterol] Other (See Comments)    rapid heart beat   Cefadroxil Hives    Patient can take amoxicillin and cipro    Outpatient Encounter Medications as of 01/08/2023  Medication Sig   acetaminophen (TYLENOL) 650 MG CR tablet Take 650 mg by mouth in the morning and at bedtime.   albuterol (VENTOLIN HFA) 108 (90 Base) MCG/ACT inhaler Inhale 2 puffs into the lungs every 4 (four) hours as needed for wheezing or shortness of breath.   alendronate (FOSAMAX) 70 MG tablet Take 70 mg by mouth every Sunday. Take with a full glass of water on an empty stomach.   Ascorbic Acid (VITAMIN C) 1000 MG tablet Take 1,000 mg  by mouth every morning.   cetirizine (ZYRTEC) 10 MG tablet Take 1 tablet (10 mg total) by mouth at bedtime.   Cholecalciferol (VITAMIN D3) 50 MCG (2000 UT) TABS Take 2,000 Units by mouth every morning.   docusate sodium (COLACE) 100 MG capsule Take 200 mg by mouth at bedtime.   dorzolamide-timolol (COSOPT) 22.3-6.8 MG/ML ophthalmic solution Place 1 drop into both eyes 2 (two) times daily.   ferrous gluconate (FERGON) 240 (27 FE) MG tablet Take 240 mg by mouth every morning. Take without food   gabapentin (NEURONTIN) 100 MG capsule Take 1 capsule (100 mg total) by mouth at bedtime.   latanoprost (XALATAN) 0.005 % ophthalmic solution Place 1 drop into both eyes at bedtime.   METOPROLOL TARTRATE PO Take 6.25 mg by mouth in the morning and at bedtime.   Multiple Vitamins-Minerals (PRESERVISION AREDS 2) CAPS Take 1 capsule by mouth 2 (two) times daily.   Netarsudil Dimesylate (RHOPRESSA) 0.02 % SOLN Place 1 drop into both eyes at bedtime.   oxybutynin (DITROPAN-XL)  5 MG 24 hr tablet Take 5 mg by mouth daily.   pantoprazole (PROTONIX) 40 MG tablet Take 40 mg by mouth 2 (two) times daily.   polyethylene glycol (MIRALAX / GLYCOLAX) 17 g packet Take 8.5 g by mouth daily as needed (constipation).   Probiotic Product (ALIGN) 4 MG CAPS Take 4 mg by mouth at bedtime.   rOPINIRole (REQUIP) 0.5 MG tablet Take 0.5 mg by mouth as needed.   sertraline (ZOLOFT) 50 MG tablet Take 1.5 tablets (75 mg total) by mouth daily.   simvastatin (ZOCOR) 20 MG tablet Take 20 mg by mouth every evening.   Sodium Fluoride (PREVIDENT 5000 BOOSTER PLUS) 1.1 % PSTE Place 1 Application onto teeth See admin instructions. Brush on teeth with a toothbrush after evening mouth care. Spit out excess and do not rinse.   umeclidinium-vilanterol (ANORO ELLIPTA) 62.5-25 MCG/ACT AEPB Inhale 1 puff into the lungs every morning.   zinc oxide 20 % ointment Apply 1 Application topically See admin instructions. Apply topically to buttocks after every  incontinent episode and as needed for redness   [DISCONTINUED] potassium chloride SA (KLOR-CON M) 20 MEQ tablet Take 2 tablets (40 mEq total) by mouth daily.   No facility-administered encounter medications on file as of 01/08/2023.    Review of Systems:  Review of Systems  Constitutional:  Positive for fatigue. Negative for activity change and appetite change.  HENT:  Negative for sore throat and trouble swallowing.   Eyes:  Negative for visual disturbance.  Respiratory:  Negative for cough, shortness of breath and wheezing.   Cardiovascular:  Negative for chest pain and leg swelling.  Gastrointestinal:  Positive for blood in stool. Negative for abdominal distention, abdominal pain, constipation, diarrhea, nausea and vomiting.  Genitourinary:  Negative for dysuria.  Musculoskeletal:  Positive for arthralgias and gait problem.  Skin:  Negative for wound.  Neurological:  Positive for weakness. Negative for dizziness and light-headedness.  Psychiatric/Behavioral:  Positive for dysphoric mood. Negative for confusion and sleep disturbance. The patient is nervous/anxious.     Health Maintenance  Topic Date Due   DEXA SCAN  Never done   COVID-19 Vaccine (4 - 2023-24 season) 03/01/2022   Pneumonia Vaccine 24+ Years old (1 of 1 - PCV) 10/03/2023 (Originally 11/14/2003)   INFLUENZA VACCINE  01/30/2023   Medicare Annual Wellness (AWV)  03/20/2023   DTaP/Tdap/Td (2 - Td or Tdap) 03/12/2028   Zoster Vaccines- Shingrix  Completed   HPV VACCINES  Aged Out    Physical Exam: Vitals:   01/08/23 0943  BP: (!) 104/57  Pulse: 72  Resp: 16  Temp: 99.5 F (37.5 C)  SpO2: 93%  Weight: 184 lb 6.4 oz (83.6 kg)  Height: 5\' 1"  (1.549 m)   Body mass index is 34.84 kg/m. Physical Exam Vitals reviewed.  Constitutional:      Appearance: She is obese.  HENT:     Head: Normocephalic.  Eyes:     General:        Right eye: No discharge.        Left eye: No discharge.  Neck:     Comments: Forward  neck protrusion  Cardiovascular:     Rate and Rhythm: Normal rate and regular rhythm.     Pulses: Normal pulses.     Heart sounds: Normal heart sounds.  Pulmonary:     Effort: Pulmonary effort is normal. No respiratory distress.     Breath sounds: Normal breath sounds. No wheezing.  Abdominal:  General: Bowel sounds are normal. There is no distension.     Palpations: Abdomen is soft.     Tenderness: There is no abdominal tenderness.  Musculoskeletal:     Cervical back: Neck supple.     Right lower leg: No edema.     Left lower leg: No edema.     Comments: kyphosis  Skin:    General: Skin is warm.     Capillary Refill: Capillary refill takes less than 2 seconds.  Neurological:     General: No focal deficit present.     Mental Status: She is alert and oriented to person, place, and time.     Motor: Weakness present.     Gait: Gait abnormal.     Comments: walker  Psychiatric:        Mood and Affect: Mood normal.     Labs reviewed: Basic Metabolic Panel: Recent Labs    01/13/22 0035 01/14/22 0309 05/02/22 1839 05/05/22 0008 05/05/22 0631 10/08/22 0000 12/23/22 0000 01/05/23 1120 01/06/23 0401 01/07/23 0506  NA 141 141   < >  --    < > 140   < > 138 141 142  K 3.7 3.8   < >  --    < > 4.5   < > 3.9 3.7 3.3*  CL 110 110   < >  --    < > 106   < > 105 109 110  CO2 23 26   < >  --    < > 29*   < > 25 24 25   GLUCOSE 123* 125*   < >  --    < >  --   --  110* 113* 114*  BUN 10 11   < >  --    < > 11   < > 22 17 12   CREATININE 0.59 0.58   < >  --    < > 0.5   < > 0.46 0.51 0.51  CALCIUM 8.5* 8.5*   < >  --    < > 9.2   < > 8.6* 8.5* 8.4*  MG 1.9 2.1  --  1.9  --   --   --   --   --   --   TSH  --   --   --   --   --  2.47  --   --   --   --    < > = values in this interval not displayed.   Liver Function Tests: Recent Labs    05/02/22 1839 10/08/22 0000 01/05/23 1120 01/07/23 0506  AST 18 15 18 15   ALT 14 10 12 10   ALKPHOS 46 50 37* 31*  BILITOT 0.4  --   0.2* 0.5  PROT 6.1*  --  6.5 5.8*  ALBUMIN 3.5 4.3 3.5 3.3*   No results for input(s): "LIPASE", "AMYLASE" in the last 8760 hours. No results for input(s): "AMMONIA" in the last 8760 hours. CBC: Recent Labs    11/04/22 0000 12/23/22 0000 01/05/23 1120 01/06/23 0401 01/07/23 0506  WBC 5.1   < > 8.0 8.0 6.0  NEUTROABS 2,785.00  --  5.6  --  3.3  HGB 10.0*   < > 9.8* 9.0* 8.9*  HCT 31*   < > 31.1* 29.0* 28.6*  MCV  --   --  99.7 100.3* 100.7*  PLT 190   < > 158 142* 137*   < > = values in this interval not  displayed.   Lipid Panel: Recent Labs    02/18/22 0000  CHOL 147  HDL 51  LDLCALC 72  TRIG 162*   No results found for: "HGBA1C"  Procedures since last visit: DG Chest Port 1 View  Result Date: 01/05/2023 CLINICAL DATA:  Dark red stool EXAM: PORTABLE CHEST 1 VIEW COMPARISON:  None Available. FINDINGS: Normal mediastinum and cardiac silhouette. Normal pulmonary vasculature. No evidence of effusion, infiltrate, or pneumothorax. No acute bony abnormality. IMPRESSION: No acute cardiopulmonary process. Electronically Signed   By: Genevive Bi M.D.   On: 01/05/2023 11:27    Assessment/Plan 1. Melena - hospitalized 07/07- 07/09 due to bloody stools - hgb was 12.0> dropped to 9.8 - 07/08 EGD gastric polyps and gastritis - cont Protonix - waiting to discuss treatment goals with husband - recommend stat cbc/diff if bloody stools reoccur prior to ED evaluation  2. Blood loss anemia - 12.0 (06/24)> dropped to 9.8 (07/07) - see above - continues to have fatigue - cont Protonix  - cont ferrous gluconate  - repeat cbc/diff in 1 week - repeat iron studies in 3-4 weeks - recommend PT/OT to prevent weakness  3. Primary hypertension - controlled - cont metoprolol  4. COPD GOLD II  - not on oxygen - cont Anoro Ellipta  5. Recurrent depression (HCC) - Zoloft increased last month - Na+ 138 - trazodone discontinued during hospitalization   6. Chronic pain of both  knees - ongoing - cont scheduled tylenol and gabapentin    Labs/tests ordered:  cbc/diff, cmp, magnesium 01/13/2023    Iron, TIBC, ferritin, cbc/diff 02/06/2023 Next appt:  Visit date not found

## 2023-01-09 DIAGNOSIS — M6281 Muscle weakness (generalized): Secondary | ICD-10-CM | POA: Diagnosis not present

## 2023-01-09 DIAGNOSIS — R2681 Unsteadiness on feet: Secondary | ICD-10-CM | POA: Diagnosis not present

## 2023-01-09 DIAGNOSIS — Z9181 History of falling: Secondary | ICD-10-CM | POA: Diagnosis not present

## 2023-01-10 DIAGNOSIS — M6281 Muscle weakness (generalized): Secondary | ICD-10-CM | POA: Diagnosis not present

## 2023-01-10 DIAGNOSIS — Z9181 History of falling: Secondary | ICD-10-CM | POA: Diagnosis not present

## 2023-01-10 DIAGNOSIS — R2681 Unsteadiness on feet: Secondary | ICD-10-CM | POA: Diagnosis not present

## 2023-01-13 DIAGNOSIS — E612 Magnesium deficiency: Secondary | ICD-10-CM | POA: Diagnosis not present

## 2023-01-13 DIAGNOSIS — I10 Essential (primary) hypertension: Secondary | ICD-10-CM | POA: Diagnosis not present

## 2023-01-13 DIAGNOSIS — D500 Iron deficiency anemia secondary to blood loss (chronic): Secondary | ICD-10-CM | POA: Diagnosis not present

## 2023-01-13 DIAGNOSIS — D649 Anemia, unspecified: Secondary | ICD-10-CM | POA: Diagnosis not present

## 2023-01-14 DIAGNOSIS — R2681 Unsteadiness on feet: Secondary | ICD-10-CM | POA: Diagnosis not present

## 2023-01-14 DIAGNOSIS — M6281 Muscle weakness (generalized): Secondary | ICD-10-CM | POA: Diagnosis not present

## 2023-01-14 DIAGNOSIS — Z9181 History of falling: Secondary | ICD-10-CM | POA: Diagnosis not present

## 2023-01-14 LAB — HEPATIC FUNCTION PANEL
ALT: 8 U/L (ref 7–35)
AST: 12 — AB (ref 13–35)
Alkaline Phosphatase: 40 (ref 25–125)
Bilirubin, Total: 0.2

## 2023-01-14 LAB — CBC AND DIFFERENTIAL
HCT: 28 — AB (ref 36–46)
Hemoglobin: 9.1 — AB (ref 12.0–16.0)
Neutrophils Absolute: 3133
Platelets: 198 10*3/uL (ref 150–400)
WBC: 5.9

## 2023-01-14 LAB — COMPREHENSIVE METABOLIC PANEL
Albumin: 3.7 (ref 3.5–5.0)
Calcium: 9 (ref 8.7–10.7)
Globulin: 2.1
eGFR: 92

## 2023-01-14 LAB — BASIC METABOLIC PANEL
BUN: 9 (ref 4–21)
CO2: 31 — AB (ref 13–22)
Chloride: 108 (ref 99–108)
Creatinine: 0.5 (ref 0.5–1.1)
Glucose: 92
Potassium: 4.2 mEq/L (ref 3.5–5.1)
Sodium: 143 (ref 137–147)

## 2023-01-14 LAB — CBC: RBC: 2.91 — AB (ref 3.87–5.11)

## 2023-01-15 DIAGNOSIS — Z9181 History of falling: Secondary | ICD-10-CM | POA: Diagnosis not present

## 2023-01-15 DIAGNOSIS — R2681 Unsteadiness on feet: Secondary | ICD-10-CM | POA: Diagnosis not present

## 2023-01-15 DIAGNOSIS — M6281 Muscle weakness (generalized): Secondary | ICD-10-CM | POA: Diagnosis not present

## 2023-01-16 DIAGNOSIS — M6281 Muscle weakness (generalized): Secondary | ICD-10-CM | POA: Diagnosis not present

## 2023-01-17 DIAGNOSIS — Z9181 History of falling: Secondary | ICD-10-CM | POA: Diagnosis not present

## 2023-01-17 DIAGNOSIS — M6281 Muscle weakness (generalized): Secondary | ICD-10-CM | POA: Diagnosis not present

## 2023-01-17 DIAGNOSIS — R2681 Unsteadiness on feet: Secondary | ICD-10-CM | POA: Diagnosis not present

## 2023-01-21 DIAGNOSIS — M6281 Muscle weakness (generalized): Secondary | ICD-10-CM | POA: Diagnosis not present

## 2023-01-21 DIAGNOSIS — R2681 Unsteadiness on feet: Secondary | ICD-10-CM | POA: Diagnosis not present

## 2023-01-21 DIAGNOSIS — Z9181 History of falling: Secondary | ICD-10-CM | POA: Diagnosis not present

## 2023-01-22 ENCOUNTER — Non-Acute Institutional Stay: Payer: Medicare HMO | Admitting: Orthopedic Surgery

## 2023-01-22 ENCOUNTER — Encounter: Payer: Self-pay | Admitting: Orthopedic Surgery

## 2023-01-22 DIAGNOSIS — J449 Chronic obstructive pulmonary disease, unspecified: Secondary | ICD-10-CM | POA: Diagnosis not present

## 2023-01-22 DIAGNOSIS — M6281 Muscle weakness (generalized): Secondary | ICD-10-CM | POA: Diagnosis not present

## 2023-01-22 DIAGNOSIS — M150 Primary generalized (osteo)arthritis: Secondary | ICD-10-CM

## 2023-01-22 DIAGNOSIS — D500 Iron deficiency anemia secondary to blood loss (chronic): Secondary | ICD-10-CM

## 2023-01-22 DIAGNOSIS — G629 Polyneuropathy, unspecified: Secondary | ICD-10-CM | POA: Diagnosis not present

## 2023-01-22 DIAGNOSIS — Z8719 Personal history of other diseases of the digestive system: Secondary | ICD-10-CM | POA: Diagnosis not present

## 2023-01-22 DIAGNOSIS — R2681 Unsteadiness on feet: Secondary | ICD-10-CM | POA: Diagnosis not present

## 2023-01-22 DIAGNOSIS — R635 Abnormal weight gain: Secondary | ICD-10-CM

## 2023-01-22 DIAGNOSIS — Z9181 History of falling: Secondary | ICD-10-CM | POA: Diagnosis not present

## 2023-01-22 DIAGNOSIS — F339 Major depressive disorder, recurrent, unspecified: Secondary | ICD-10-CM

## 2023-01-22 DIAGNOSIS — M159 Polyosteoarthritis, unspecified: Secondary | ICD-10-CM

## 2023-01-22 NOTE — Progress Notes (Signed)
Location:   [facility] Room Number: 20 A Place of Service:  ALF [phone]) Provider:  Hazle Nordmann, NP  Octavia Heir, NP  Patient Care Team: Octavia Heir, NP as PCP - General (Adult Health Nurse Practitioner) [name], MD as Consulting Physician (Internal Medicine)  Extended Emergency Contact Information Primary Emergency Contact: Sultana,[name] Address: [address]          [place], [place] [address] [name] of [place] Home Phone: [phone] Mobile Phone: [phone] Relation: Spouse Secondary Emergency Contact: Kateri, Balch Mobile Phone: [phone] Relation: Son Interpreter needed? No  Code Status:   Goals of care: Advanced Directive information    01/22/2023    9:58 AM  Advanced Directives  Does Patient Have a Medical Advance Directive? No     Chief Complaint  Patient presents with   Medical Management of Chronic Issues    Routine follow up   Immunizations    COVID booster due   Quality Metric Gaps    Dexa scan and Medicare annual wellness due    HPI:  Pt is a 82 y.o. female seen today for medical management of chronic diseases.    She currently resides on the assisted living unit at [facility]. PMH: HTN, COPD, pulmonary nodule, Diverticulosis with GI bleed, GERD, OA, osteoporosis,macular degeneration, iron def anemia, unstable gait, anxiety, and depression.   H/o diverticular bleed- diverticular bleed 11/2020 s/p embolization with recurrence 05/2021. Hospitalized 05/2022 due to ongoing rectal bleeding>Hgb dropped from 12.3 to 8.9. Hospitalized 07/07 due to bloody stools, hgb 9.8> was 12.1, no recent episodes Blood loss anemia- see above, admits to fatigue, remains on ferrous gluconate, Hgb 9.1 (07/16) COPD- not on oxygen, remains on anoro Ellipta  Recurrent depression- related to declining vision/chronic conditions, today's PHQ score 3> was 7, Zoloft increased last month, Na+ 143 01/14/2023 Neuropathy- started on  gabapentin last month> improved  OA- involves multiple joints, knees and neck most bothersome, remains on scheduled tylenol  Weight gain- see trends below, sedentary lifestyle, TSH 2.47 10/08/2022  No recent falls or injuries.   Recent weights:  07/01- 184.4 lbs  06/01- 183 lbs  05/01- 181 lbs  Recent blood pressures:  07/17- 115/60  07/10- 108/57  07/09- 104/57     Past Medical History:  Diagnosis Date   Anxiety    Arthritis    "bilateral knees, shoulders, elbows; neck, pretty widespread" (09/05/2017)   BPPV (benign paroxysmal positional vertigo)    Depression    GERD (gastroesophageal reflux disease)    Glaucoma, both eyes    Headache    "probably 2/month" (09/05/2017)   History of blood transfusion ~ 2008   "related to LGIB"   Hyperlipemia    Lower GI bleeding ~ 2008; 09/05/2017   "had to have blood transfusion"   Macular degeneration, bilateral    Osteopenia    Seasonal allergies    Skin cancer, basal cell 2001   "off my nose, left side"   Sleeping excessive    Tinnitus of both ears    Past Surgical History:  Procedure Laterality Date   BALLOON DILATION N/A 05/08/2021   Procedure: BALLOON DILATION;  Surgeon: Kerin Salen, MD;  Location: [facility] ENDOSCOPY;  Service: Gastroenterology;  Laterality: N/A;   BASAL CELL CARCINOMA EXCISION  2001   "off my nose, left side"   BIOPSY  05/08/2021   Procedure: BIOPSY;  Surgeon: Kerin Salen, MD;  Location: [facility] ENDOSCOPY;  Service: Gastroenterology;;   BLEPHAROPLASTY Bilateral  CATARACT EXTRACTION W/ INTRAOCULAR LENS  IMPLANT, BILATERAL Bilateral 1990's   COLONOSCOPY WITH PROPOFOL N/A 05/04/2022   Procedure: COLONOSCOPY WITH PROPOFOL;  Surgeon: Kerin Salen, MD;  Location: [facility] ENDOSCOPY;  Service: Gastroenterology;  Laterality: N/A;   ESOPHAGOGASTRODUODENOSCOPY (EGD) WITH PROPOFOL N/A 05/08/2021   Procedure: ESOPHAGOGASTRODUODENOSCOPY (EGD) WITH PROPOFOL;  Surgeon: Kerin Salen, MD;  Location[facility] ENDOSCOPY;  Service: Gastroenterology;   Laterality: N/A;   ESOPHAGOGASTRODUODENOSCOPY (EGD) WITH PROPOFOL N/A 01/11/2022   Procedure: ESOPHAGOGASTRODUODENOSCOPY (EGD) WITH PROPOFOL;  Surgeon: Vida Rigger, MD;  Location: [facility] ENDOSCOPY;  Service: Gastroenterology;  Laterality: N/A;   ESOPHAGOGASTRODUODENOSCOPY (EGD) WITH PROPOFOL N/A 01/06/2023   Procedure: ESOPHAGOGASTRODUODENOSCOPY (EGD) WITH PROPOFOL;  Surgeon: Vida Rigger, MD;  Location: [facility] ENDOSCOPY;  Service: Gastroenterology;  Laterality: N/A;   EYE SURGERY Bilateral    "to improve vision after cataract OR"   IR ANGIOGRAM FOLLOW UP STUDY  12/19/2020   IR ANGIOGRAM SELECTIVE EACH ADDITIONAL VESSEL  12/19/2020   IR ANGIOGRAM SELECTIVE EACH ADDITIONAL VESSEL  12/19/2020   IR ANGIOGRAM VISCERAL SELECTIVE  12/19/2020   IR EMBO ART  VEN HEMORR LYMPH EXTRAV  INC GUIDE ROADMAPPING  12/19/2020   IR US GUIDE VASC ACCESS RIGHT  12/19/2020   JOINT REPLACEMENT     POLYPECTOMY  05/04/2022   Procedure: POLYPECTOMY;  Surgeon: Kerin Salen, MD;  Location: [facility] ENDOSCOPY;  Service: Gastroenterology;;   STAPEDES SURGERY Left    "scraped stapedes because it was sticking when it wasn't suppose to"   TONSILLECTOMY AND ADENOIDECTOMY  1946   TOTAL KNEE ARTHROPLASTY Left ~ 2008   TOTAL KNEE ARTHROPLASTY  12/20/2011   Procedure: TOTAL KNEE ARTHROPLASTY;  Surgeon: Verlee Rossetti, MD;  Location: [facility] OR;  Service: Orthopedics;  Laterality: Right;  Right Total Knee Arthroplasty   TUBAL LIGATION  1980's    Allergies  Allergen Reactions   Morphine And Codeine Itching   Aspirin Other (See Comments)    Unknown reaction   Duloxetine Hcl Other (See Comments)    Unknown reaction   Tymlos [Abaloparatide] Other (See Comments)    Unknown reaction   Ventolin [Albuterol] Other (See Comments)    rapid heart beat   Cefadroxil Hives    Patient can take amoxicillin and cipro    Allergies as of 01/22/2023       Reactions   Morphine And Codeine Itching   Aspirin Other (See Comments)   Unknown reaction   Duloxetine Hcl  Other (See Comments)   Unknown reaction   Tymlos [abaloparatide] Other (See Comments)   Unknown reaction   Ventolin [albuterol] Other (See Comments)   rapid heart beat   Cefadroxil Hives   Patient can take amoxicillin and cipro        Medication List        Accurate as of January 22, 2023 10:20 AM. If you have any questions, ask your nurse or doctor.          acetaminophen 650 MG CR tablet Commonly known as: TYLENOL Take 1,300 mg by mouth in the morning and at bedtime.   albuterol 108 (90 Base) MCG/ACT inhaler Commonly known as: VENTOLIN HFA Inhale 2 puffs into the lungs every 4 (four) hours as needed for wheezing or shortness of breath.   alendronate 70 MG tablet Commonly known as: FOSAMAX Take 70 mg by mouth every Sunday. Take with a full glass of water on an empty stomach.   Align 4 MG Caps Take 4 mg by mouth at bedtime.   Anoro Ellipta 62.5-25 MCG/ACT Aepb  Generic drug: umeclidinium-vilanterol Inhale 1 puff into the lungs every morning.   cetirizine 10 MG tablet Commonly known as: ZYRTEC Take 1 tablet (10 mg total) by mouth at bedtime.   docusate sodium 100 MG capsule Commonly known as: COLACE Take 200 mg by mouth at bedtime.   dorzolamide-timolol 2-0.5 % ophthalmic solution Commonly known as: COSOPT Place 1 drop into both eyes 2 (two) times daily.   ferrous gluconate 240 (27 FE) MG tablet Commonly known as: FERGON Take 240 mg by mouth every morning. Take without food   gabapentin 100 MG capsule Commonly known as: NEURONTIN Take 1 capsule (100 mg total) by mouth at bedtime.   latanoprost 0.005 % ophthalmic solution Commonly known as: XALATAN Place 1 drop into both eyes at bedtime.   METOPROLOL TARTRATE PO Take 6.25 mg by mouth in the morning and at bedtime.   oxybutynin 5 MG 24 hr tablet Commonly known as: DITROPAN-XL Take 5 mg by mouth daily.   pantoprazole 40 MG tablet Commonly known as: PROTONIX Take 40 mg by mouth 2 (two) times daily.    polyethylene glycol 17 g packet Commonly known as: MIRALAX / GLYCOLAX Take 8.5 g by mouth daily as needed (constipation).   POTASSIUM CHLORIDE ER PO Take 40 mEq by mouth daily.   PreserVision AREDS 2 Caps Take 1 capsule by mouth 2 (two) times daily.   PreviDent 5000 Booster Plus 1.1 % Pste Generic drug: Sodium Fluoride Place 1 Application onto teeth See admin instructions. Brush on teeth with a toothbrush after evening mouth care. Spit out excess and do not rinse.   Rhopressa 0.02 % Soln Generic drug: Netarsudil Dimesylate Place 1 drop into both eyes at bedtime.   rOPINIRole 0.5 MG tablet Commonly known as: REQUIP Take 0.5 mg by mouth as needed.   sertraline 50 MG tablet Commonly known as: ZOLOFT Take 1.5 tablets (75 mg total) by mouth daily.   simvastatin 20 MG tablet Commonly known as: ZOCOR Take 20 mg by mouth every evening.   vitamin C 1000 MG tablet Take 1,000 mg by mouth every morning.   Vitamin D3 50 MCG (2000 UT) Tabs Take 2,000 Units by mouth every morning.   zinc oxide 20 % ointment Apply 1 Application topically See admin instructions. Apply topically to buttocks after every incontinent episode and as needed for redness        Review of Systems  Constitutional:  Positive for fatigue. Negative for activity change and appetite change.  HENT:  Positive for hearing loss. Negative for trouble swallowing.   Eyes:  Positive for visual disturbance.  Respiratory:  Negative for cough, shortness of breath and wheezing.   Cardiovascular:  Negative for chest pain and leg swelling.  Gastrointestinal:  Negative for abdominal distention, abdominal pain and blood in stool.  Genitourinary:  Negative for dysuria and hematuria.  Musculoskeletal:  Positive for arthralgias, gait problem and neck pain.  Skin:  Negative for wound.  Neurological:  Positive for weakness and numbness. Negative for dizziness and headaches.  Psychiatric/Behavioral:  Positive for dysphoric mood.  Negative for confusion and sleep disturbance. The patient is not nervous/anxious.     Immunization History  Administered Date(s) Administered   Fluad Quad(high Dose 65+) 04/24/2022   Influenza, High Dose Seasonal PF 03/14/2018   PFIZER(Purple Top)SARS-COV-2 Vaccination 07/25/2019, 08/16/2019, 12/12/2021   Pneumococcal-Unspecified 03/02/2011   Tdap 03/12/2018   Zoster Recombinant(Shingrix) 10/08/2017, 12/09/2017   Pertinent  Health Maintenance Due  Topic Date Due   DEXA SCAN  Never done  INFLUENZA VACCINE  01/30/2023      05/06/2022    9:11 PM 05/07/2022   12:25 PM 05/17/2022   10:19 AM 07/12/2022   11:08 AM 10/30/2022   12:44 PM  Fall Risk  Falls in the past year?   1 0 0  Was there an injury with Fall?   1 0 0  Fall Risk Category Calculator   2 0 0  Fall Risk Category (Retired)   Moderate Low   (RETIRED) Patient Fall Risk Level Moderate fall risk Moderate fall risk Moderate fall risk Low fall risk   Patient at Risk for Falls Due to   History of fall(s) History of fall(s) History of fall(s);Impaired balance/gait;Impaired mobility  Fall risk Follow up   Falls evaluation completed Falls evaluation completed Falls evaluation completed;Education provided;Falls prevention discussed   Functional Status Survey:    Vitals:   01/22/23 0955  BP: 115/60  Pulse: 82  Resp: (!) 22  Temp: 98.1 F (36.7 C)  SpO2: 93%  Weight: 184 lb 6.4 oz (83.6 kg)  Height: 5\' 1"  (1.549 m)   Body mass index is 34.84 kg/m. Physical Exam Vitals reviewed.  Constitutional:      General: She is not in acute distress.    Comments: Pale complexion  HENT:     Head: Normocephalic.     Right Ear: There is no impacted cerumen.     Left Ear: There is no impacted cerumen.     Nose: Nose normal.     Mouth/Throat:     Mouth: Mucous membranes are moist.  Eyes:     General:        Right eye: No discharge.        Left eye: No discharge.  Cardiovascular:     Rate and Rhythm: Normal rate and regular  rhythm.     Pulses: Normal pulses.     Heart sounds: Normal heart sounds.  Pulmonary:     Effort: Pulmonary effort is normal.     Breath sounds: Normal breath sounds.  Abdominal:     General: Bowel sounds are normal. There is no distension.     Palpations: Abdomen is soft.     Tenderness: There is no abdominal tenderness.  Musculoskeletal:     Cervical back: Neck supple.     Right lower leg: Edema present.     Left lower leg: Edema present.     Comments: Non pitting  Skin:    General: Skin is warm.     Capillary Refill: Capillary refill takes less than 2 seconds.  Neurological:     General: No focal deficit present.     Mental Status: She is alert and oriented to person, place, and time.     Motor: Weakness present.     Gait: Gait abnormal.     Comments: wheelchair  Psychiatric:        Mood and Affect: Mood normal.     Labs reviewed: Recent Labs    05/05/22 0008 05/05/22 0631 01/05/23 1120 01/06/23 0401 01/07/23 0506 01/14/23 0000  NA  --    < > 138 141 142 143  K  --    < > 3.9 3.7 3.3* 4.2  CL  --    < > 105 109 110 108  CO2  --    < > 25 24 25  31*  GLUCOSE  --    < > 110* 113* 114*  --   BUN  --    < > 22  17 12 9   CREATININE  --    < > 0.46 0.51 0.51 0.5  CALCIUM  --    < > 8.6* 8.5* 8.4* 9.0  MG 1.9  --   --   --   --   --    < > = values in this interval not displayed.   Recent Labs    05/02/22 1839 10/08/22 0000 01/05/23 1120 01/07/23 0506 01/14/23 0000  AST 18   < > 18 15 12*  ALT 14   < > 12 10 8   ALKPHOS 46   < > 37* 31* 40  BILITOT 0.4  --  0.2* 0.5  --   PROT 6.1*  --  6.5 5.8*  --   ALBUMIN 3.5   < > 3.5 3.3* 3.7   < > = values in this interval not displayed.   Recent Labs    01/05/23 1120 01/06/23 0401 01/07/23 0506 01/14/23 0000  WBC 8.0 8.0 6.0 5.9  NEUTROABS 5.6  --  3.3 3,133.00  HGB 9.8* 9.0* 8.9* 9.1*  HCT 31.1* 29.0* 28.6* 28*  MCV 99.7 100.3* 100.7*  --   PLT 158 142* 137* 198   Lab Results  Component Value Date   TSH  2.47 10/08/2022   No results found for: "HGBA1C" Lab Results  Component Value Date   CHOL 147 02/18/2022   HDL 51 02/18/2022   LDLCALC 72 02/18/2022   TRIG 162 (A) 02/18/2022    Significant Diagnostic Results in last 30 days:  DG Chest Port 1 View  Result Date: 01/05/2023 CLINICAL DATA:  Dark red stool EXAM: PORTABLE CHEST 1 VIEW COMPARISON:  None Available. FINDINGS: Normal mediastinum and cardiac silhouette. Normal pulmonary vasculature. No evidence of effusion, infiltrate, or pneumothorax. No acute bony abnormality. IMPRESSION: No acute cardiopulmonary process. Electronically Signed   By: Genevive Bi M.D.   On: 01/05/2023 11:27    Assessment/Plan 1. History of GI diverticular bleed - hospitalized 07/07- 07/09 due to bloody stools> 3rd episode since 11/2020 - hgb was 12.0> dropped to 9.8 - 07/08 EGD gastric polyps and gastritis - cont Protonix - recommend stat cbc/diff if bloody stools reoccur prior to ED evaluation  2. Blood loss anemia - Hgb 9.1 01/14/2023 - see above - cont ferrous gluconate  3. COPD GOLD II  - not on oxygen - cont anoro Ellipta  4. Recurrent depression (HCC) - related to declining vision/ chronic conditions - PHQ score was 7 (11/2022)> now 3 - zoloft increased last month  - Na+ 143 01/14/2023  5. Neuropathy - improved - gabapentin started last month  6. Primary osteoarthritis involving multiple joints - bilateral knees and neck most bothersome - will increase morning tylenol to 1000 mg  - cont tylenol 650 mg in evening  7. Weight gain - ongoing - weight gain > 20 lbs in 1 year - sedentary lifestyle - cont monthly weights    Family/ staff Communication: plan discussed with patient and nurse  Labs/tests ordered:  none

## 2023-01-23 DIAGNOSIS — M6281 Muscle weakness (generalized): Secondary | ICD-10-CM | POA: Diagnosis not present

## 2023-01-24 DIAGNOSIS — M6281 Muscle weakness (generalized): Secondary | ICD-10-CM | POA: Diagnosis not present

## 2023-01-24 DIAGNOSIS — R2681 Unsteadiness on feet: Secondary | ICD-10-CM | POA: Diagnosis not present

## 2023-01-24 DIAGNOSIS — R41841 Cognitive communication deficit: Secondary | ICD-10-CM | POA: Diagnosis not present

## 2023-01-24 DIAGNOSIS — Z9181 History of falling: Secondary | ICD-10-CM | POA: Diagnosis not present

## 2023-01-24 DIAGNOSIS — R1312 Dysphagia, oropharyngeal phase: Secondary | ICD-10-CM | POA: Diagnosis not present

## 2023-01-27 DIAGNOSIS — R1312 Dysphagia, oropharyngeal phase: Secondary | ICD-10-CM | POA: Diagnosis not present

## 2023-01-27 DIAGNOSIS — R41841 Cognitive communication deficit: Secondary | ICD-10-CM | POA: Diagnosis not present

## 2023-01-28 DIAGNOSIS — M6281 Muscle weakness (generalized): Secondary | ICD-10-CM | POA: Diagnosis not present

## 2023-01-28 DIAGNOSIS — R2681 Unsteadiness on feet: Secondary | ICD-10-CM | POA: Diagnosis not present

## 2023-01-28 DIAGNOSIS — Z9181 History of falling: Secondary | ICD-10-CM | POA: Diagnosis not present

## 2023-01-29 DIAGNOSIS — Z9181 History of falling: Secondary | ICD-10-CM | POA: Diagnosis not present

## 2023-01-29 DIAGNOSIS — R41841 Cognitive communication deficit: Secondary | ICD-10-CM | POA: Diagnosis not present

## 2023-01-29 DIAGNOSIS — M6281 Muscle weakness (generalized): Secondary | ICD-10-CM | POA: Diagnosis not present

## 2023-01-29 DIAGNOSIS — R1312 Dysphagia, oropharyngeal phase: Secondary | ICD-10-CM | POA: Diagnosis not present

## 2023-01-29 DIAGNOSIS — R2681 Unsteadiness on feet: Secondary | ICD-10-CM | POA: Diagnosis not present

## 2023-01-30 DIAGNOSIS — M6281 Muscle weakness (generalized): Secondary | ICD-10-CM | POA: Diagnosis not present

## 2023-01-31 DIAGNOSIS — R2681 Unsteadiness on feet: Secondary | ICD-10-CM | POA: Diagnosis not present

## 2023-01-31 DIAGNOSIS — Z9181 History of falling: Secondary | ICD-10-CM | POA: Diagnosis not present

## 2023-01-31 DIAGNOSIS — M6281 Muscle weakness (generalized): Secondary | ICD-10-CM | POA: Diagnosis not present

## 2023-01-31 DIAGNOSIS — R41841 Cognitive communication deficit: Secondary | ICD-10-CM | POA: Diagnosis not present

## 2023-01-31 DIAGNOSIS — R1312 Dysphagia, oropharyngeal phase: Secondary | ICD-10-CM | POA: Diagnosis not present

## 2023-02-04 DIAGNOSIS — Z9181 History of falling: Secondary | ICD-10-CM | POA: Diagnosis not present

## 2023-02-04 DIAGNOSIS — M6281 Muscle weakness (generalized): Secondary | ICD-10-CM | POA: Diagnosis not present

## 2023-02-04 DIAGNOSIS — R2681 Unsteadiness on feet: Secondary | ICD-10-CM | POA: Diagnosis not present

## 2023-02-05 DIAGNOSIS — R1312 Dysphagia, oropharyngeal phase: Secondary | ICD-10-CM | POA: Diagnosis not present

## 2023-02-05 DIAGNOSIS — Z9181 History of falling: Secondary | ICD-10-CM | POA: Diagnosis not present

## 2023-02-05 DIAGNOSIS — R41841 Cognitive communication deficit: Secondary | ICD-10-CM | POA: Diagnosis not present

## 2023-02-05 DIAGNOSIS — M6281 Muscle weakness (generalized): Secondary | ICD-10-CM | POA: Diagnosis not present

## 2023-02-05 DIAGNOSIS — R2681 Unsteadiness on feet: Secondary | ICD-10-CM | POA: Diagnosis not present

## 2023-02-06 DIAGNOSIS — D649 Anemia, unspecified: Secondary | ICD-10-CM | POA: Diagnosis not present

## 2023-02-07 DIAGNOSIS — R1312 Dysphagia, oropharyngeal phase: Secondary | ICD-10-CM | POA: Diagnosis not present

## 2023-02-07 DIAGNOSIS — R41841 Cognitive communication deficit: Secondary | ICD-10-CM | POA: Diagnosis not present

## 2023-02-07 DIAGNOSIS — Z9181 History of falling: Secondary | ICD-10-CM | POA: Diagnosis not present

## 2023-02-07 DIAGNOSIS — R2681 Unsteadiness on feet: Secondary | ICD-10-CM | POA: Diagnosis not present

## 2023-02-07 DIAGNOSIS — M6281 Muscle weakness (generalized): Secondary | ICD-10-CM | POA: Diagnosis not present

## 2023-02-07 LAB — CBC AND DIFFERENTIAL
HCT: 35 — AB (ref 36–46)
Hemoglobin: 11.4 — AB (ref 12.0–16.0)
Neutrophils Absolute: 3859
Platelets: 186 10*3/uL (ref 150–400)
WBC: 6.4

## 2023-02-07 LAB — IRON,TIBC AND FERRITIN PANEL
%SAT: 21
Ferritin: 44
Iron: 70
TIBC: 326

## 2023-02-07 LAB — CBC: RBC: 3.66 — AB (ref 3.87–5.11)

## 2023-02-11 DIAGNOSIS — R2681 Unsteadiness on feet: Secondary | ICD-10-CM | POA: Diagnosis not present

## 2023-02-11 DIAGNOSIS — Z9181 History of falling: Secondary | ICD-10-CM | POA: Diagnosis not present

## 2023-02-11 DIAGNOSIS — M6281 Muscle weakness (generalized): Secondary | ICD-10-CM | POA: Diagnosis not present

## 2023-02-12 DIAGNOSIS — R2681 Unsteadiness on feet: Secondary | ICD-10-CM | POA: Diagnosis not present

## 2023-02-12 DIAGNOSIS — R1312 Dysphagia, oropharyngeal phase: Secondary | ICD-10-CM | POA: Diagnosis not present

## 2023-02-12 DIAGNOSIS — R41841 Cognitive communication deficit: Secondary | ICD-10-CM | POA: Diagnosis not present

## 2023-02-12 DIAGNOSIS — Z9181 History of falling: Secondary | ICD-10-CM | POA: Diagnosis not present

## 2023-02-12 DIAGNOSIS — M6281 Muscle weakness (generalized): Secondary | ICD-10-CM | POA: Diagnosis not present

## 2023-02-14 DIAGNOSIS — Z9181 History of falling: Secondary | ICD-10-CM | POA: Diagnosis not present

## 2023-02-14 DIAGNOSIS — R2681 Unsteadiness on feet: Secondary | ICD-10-CM | POA: Diagnosis not present

## 2023-02-14 DIAGNOSIS — M6281 Muscle weakness (generalized): Secondary | ICD-10-CM | POA: Diagnosis not present

## 2023-02-17 DIAGNOSIS — R1312 Dysphagia, oropharyngeal phase: Secondary | ICD-10-CM | POA: Diagnosis not present

## 2023-02-17 DIAGNOSIS — M6281 Muscle weakness (generalized): Secondary | ICD-10-CM | POA: Diagnosis not present

## 2023-02-17 DIAGNOSIS — R41841 Cognitive communication deficit: Secondary | ICD-10-CM | POA: Diagnosis not present

## 2023-02-17 DIAGNOSIS — R2681 Unsteadiness on feet: Secondary | ICD-10-CM | POA: Diagnosis not present

## 2023-02-17 DIAGNOSIS — Z9181 History of falling: Secondary | ICD-10-CM | POA: Diagnosis not present

## 2023-02-18 DIAGNOSIS — M6281 Muscle weakness (generalized): Secondary | ICD-10-CM | POA: Diagnosis not present

## 2023-02-19 DIAGNOSIS — Z961 Presence of intraocular lens: Secondary | ICD-10-CM | POA: Diagnosis not present

## 2023-02-19 DIAGNOSIS — H353131 Nonexudative age-related macular degeneration, bilateral, early dry stage: Secondary | ICD-10-CM | POA: Diagnosis not present

## 2023-02-19 DIAGNOSIS — R41841 Cognitive communication deficit: Secondary | ICD-10-CM | POA: Diagnosis not present

## 2023-02-19 DIAGNOSIS — R1312 Dysphagia, oropharyngeal phase: Secondary | ICD-10-CM | POA: Diagnosis not present

## 2023-02-19 DIAGNOSIS — H401132 Primary open-angle glaucoma, bilateral, moderate stage: Secondary | ICD-10-CM | POA: Diagnosis not present

## 2023-02-19 DIAGNOSIS — H5203 Hypermetropia, bilateral: Secondary | ICD-10-CM | POA: Diagnosis not present

## 2023-02-20 DIAGNOSIS — M6281 Muscle weakness (generalized): Secondary | ICD-10-CM | POA: Diagnosis not present

## 2023-02-21 DIAGNOSIS — M6281 Muscle weakness (generalized): Secondary | ICD-10-CM | POA: Diagnosis not present

## 2023-02-21 DIAGNOSIS — R2681 Unsteadiness on feet: Secondary | ICD-10-CM | POA: Diagnosis not present

## 2023-02-21 DIAGNOSIS — Z9181 History of falling: Secondary | ICD-10-CM | POA: Diagnosis not present

## 2023-02-25 DIAGNOSIS — M6281 Muscle weakness (generalized): Secondary | ICD-10-CM | POA: Diagnosis not present

## 2023-02-25 DIAGNOSIS — R2681 Unsteadiness on feet: Secondary | ICD-10-CM | POA: Diagnosis not present

## 2023-02-25 DIAGNOSIS — R41841 Cognitive communication deficit: Secondary | ICD-10-CM | POA: Diagnosis not present

## 2023-02-25 DIAGNOSIS — R1312 Dysphagia, oropharyngeal phase: Secondary | ICD-10-CM | POA: Diagnosis not present

## 2023-02-25 DIAGNOSIS — Z9181 History of falling: Secondary | ICD-10-CM | POA: Diagnosis not present

## 2023-02-26 DIAGNOSIS — Z9181 History of falling: Secondary | ICD-10-CM | POA: Diagnosis not present

## 2023-02-26 DIAGNOSIS — R2681 Unsteadiness on feet: Secondary | ICD-10-CM | POA: Diagnosis not present

## 2023-02-26 DIAGNOSIS — M6281 Muscle weakness (generalized): Secondary | ICD-10-CM | POA: Diagnosis not present

## 2023-02-27 DIAGNOSIS — R41841 Cognitive communication deficit: Secondary | ICD-10-CM | POA: Diagnosis not present

## 2023-02-27 DIAGNOSIS — R1312 Dysphagia, oropharyngeal phase: Secondary | ICD-10-CM | POA: Diagnosis not present

## 2023-02-27 DIAGNOSIS — M6281 Muscle weakness (generalized): Secondary | ICD-10-CM | POA: Diagnosis not present

## 2023-02-28 DIAGNOSIS — M6281 Muscle weakness (generalized): Secondary | ICD-10-CM | POA: Diagnosis not present

## 2023-02-28 DIAGNOSIS — R2681 Unsteadiness on feet: Secondary | ICD-10-CM | POA: Diagnosis not present

## 2023-02-28 DIAGNOSIS — Z9181 History of falling: Secondary | ICD-10-CM | POA: Diagnosis not present

## 2023-03-04 DIAGNOSIS — R41841 Cognitive communication deficit: Secondary | ICD-10-CM | POA: Diagnosis not present

## 2023-03-04 DIAGNOSIS — R1312 Dysphagia, oropharyngeal phase: Secondary | ICD-10-CM | POA: Diagnosis not present

## 2023-03-06 DIAGNOSIS — R1312 Dysphagia, oropharyngeal phase: Secondary | ICD-10-CM | POA: Diagnosis not present

## 2023-03-06 DIAGNOSIS — R41841 Cognitive communication deficit: Secondary | ICD-10-CM | POA: Diagnosis not present

## 2023-03-11 DIAGNOSIS — R41841 Cognitive communication deficit: Secondary | ICD-10-CM | POA: Diagnosis not present

## 2023-03-11 DIAGNOSIS — R1312 Dysphagia, oropharyngeal phase: Secondary | ICD-10-CM | POA: Diagnosis not present

## 2023-03-12 DIAGNOSIS — R41841 Cognitive communication deficit: Secondary | ICD-10-CM | POA: Diagnosis not present

## 2023-03-12 DIAGNOSIS — R1312 Dysphagia, oropharyngeal phase: Secondary | ICD-10-CM | POA: Diagnosis not present

## 2023-03-18 DIAGNOSIS — R41841 Cognitive communication deficit: Secondary | ICD-10-CM | POA: Diagnosis not present

## 2023-03-18 DIAGNOSIS — R1312 Dysphagia, oropharyngeal phase: Secondary | ICD-10-CM | POA: Diagnosis not present

## 2023-03-19 DIAGNOSIS — I700 Atherosclerosis of aorta: Secondary | ICD-10-CM | POA: Insufficient documentation

## 2023-03-19 DIAGNOSIS — E66811 Obesity, class 1: Secondary | ICD-10-CM | POA: Insufficient documentation

## 2023-03-19 DIAGNOSIS — E669 Obesity, unspecified: Secondary | ICD-10-CM | POA: Insufficient documentation

## 2023-03-19 DIAGNOSIS — I5030 Unspecified diastolic (congestive) heart failure: Secondary | ICD-10-CM | POA: Insufficient documentation

## 2023-03-20 DIAGNOSIS — R1312 Dysphagia, oropharyngeal phase: Secondary | ICD-10-CM | POA: Diagnosis not present

## 2023-03-20 DIAGNOSIS — R41841 Cognitive communication deficit: Secondary | ICD-10-CM | POA: Diagnosis not present

## 2023-03-25 ENCOUNTER — Telehealth: Payer: Medicare HMO | Admitting: Adult Health

## 2023-03-25 ENCOUNTER — Non-Acute Institutional Stay: Payer: Medicare HMO | Admitting: Adult Health

## 2023-03-25 ENCOUNTER — Encounter: Payer: Self-pay | Admitting: Adult Health

## 2023-03-25 DIAGNOSIS — K921 Melena: Secondary | ICD-10-CM | POA: Diagnosis not present

## 2023-03-25 DIAGNOSIS — D500 Iron deficiency anemia secondary to blood loss (chronic): Secondary | ICD-10-CM | POA: Diagnosis not present

## 2023-03-25 DIAGNOSIS — Z8719 Personal history of other diseases of the digestive system: Secondary | ICD-10-CM

## 2023-03-25 DIAGNOSIS — I10 Essential (primary) hypertension: Secondary | ICD-10-CM | POA: Diagnosis not present

## 2023-03-25 DIAGNOSIS — D649 Anemia, unspecified: Secondary | ICD-10-CM | POA: Diagnosis not present

## 2023-03-25 DIAGNOSIS — R41841 Cognitive communication deficit: Secondary | ICD-10-CM | POA: Diagnosis not present

## 2023-03-25 DIAGNOSIS — R1312 Dysphagia, oropharyngeal phase: Secondary | ICD-10-CM | POA: Diagnosis not present

## 2023-03-25 NOTE — Telephone Encounter (Signed)
Nurse called to report this pt had two bloody dark stools. BP was 170/95 HR 82.  She has no abd pain or distress. Otherwise in her usual state of health. Ordered CBC BMP in am. For worsening bleeding send to the ER.

## 2023-03-25 NOTE — Progress Notes (Signed)
Location:  [facility] Room Number: AL20-A Place of Service:  ALF [phone]) Provider:  Kenard Gower, DNP, FNP-BC  Patient Care Team: Octavia Heir, NP as PCP - General (Adult Health Nurse Practitioner) [name], MD as Consulting Physician (Internal Medicine)  Extended Emergency Contact Information Primary Emergency Contact: Bellows,[name] Address: [address]          [place], [place] [address] [name] of [place] Home Phone: [phone] Mobile Phone: [phone] Relation: Spouse Secondary Emergency Contact: [place], [place] Mobile Phone: [phone] Relation: Son Interpreter needed? No  Code Status:  Full Code  Goals of care: Advanced Directive information    03/25/2023   10:29 AM  Advanced Directives  Does Patient Have a Medical Advance Directive? No  Would patient like information on creating a medical advance directive? No - Patient declined     Chief Complaint  Patient presents with   Acute Visit    2 bloody dark stools    HPI:  Pt is a 82 y.o. female seen today for an acute visit regarding bloody stool. She is a resident of [facility] [address] [facility]. She was reported to have 2 bloody stools, stool is dark/tarry. Patient was seen in her room today. She ambulates with walker. She denies abdominal pain. BP 139/65, PR 89, RR 18, T 97.5 and O2 sat 97% on room air. She takes Ferrous gluconate 240 mg daily for anemia.  She denies constipation. She takes Docusate 2 capsules daily for constipation. She has history of GI bleed. She currently takes Pantoprazole 40 mg BID. Abdomen is non tender to touch.  Past Medical History:  Diagnosis Date   Anxiety    Arthritis    "bilateral knees, shoulders, elbows; neck, pretty widespread" (09/05/2017)   BPPV (benign paroxysmal positional vertigo)    Depression    GERD (gastroesophageal reflux disease)    Glaucoma, both eyes    Headache    "probably 2/month" (09/05/2017)   History of blood  transfusion ~ 2008   "related to LGIB"   Hyperlipemia    Lower GI bleeding ~ 2008; 09/05/2017   "had to have blood transfusion"   Macular degeneration, bilateral    Osteopenia    Seasonal allergies    Skin cancer, basal cell 2001   "off my nose, left side"   Sleeping excessive    Tinnitus of both ears    Past Surgical History:  Procedure Laterality Date   BALLOON DILATION N/A 05/08/2021   Procedure: BALLOON DILATION;  Surgeon: Kerin Salen, MD;  Location: [facility] ENDOSCOPY;  Service: Gastroenterology;  Laterality: N/A;   BASAL CELL CARCINOMA EXCISION  2001   "off my nose, left side"   BIOPSY  05/08/2021   Procedure: BIOPSY;  Surgeon: Kerin Salen, MD;  Location: [facility] ENDOSCOPY;  Service: Gastroenterology;;   BLEPHAROPLASTY Bilateral    CATARACT EXTRACTION W/ INTRAOCULAR LENS  IMPLANT, BILATERAL Bilateral 1990's   COLONOSCOPY WITH PROPOFOL N/A 05/04/2022   Procedure: COLONOSCOPY WITH PROPOFOL;  Surgeon: Kerin Salen, MD;  Location: [facility] ENDOSCOPY;  Service: Gastroenterology;  Laterality: N/A;   ESOPHAGOGASTRODUODENOSCOPY (EGD) WITH PROPOFOL N/A 05/08/2021   Procedure: ESOPHAGOGASTRODUODENOSCOPY (EGD) WITH PROPOFOL;  Surgeon: Kerin Salen, MD;  Location: [name] ENDOSCOPY;  Service: Gastroenterology;  Laterality: N/A;   ESOPHAGOGASTRODUODENOSCOPY (EGD) WITH PROPOFOL N/A 01/11/2022   Procedure: ESOPHAGOGASTRODUODENOSCOPY (EGD) WITH PROPOFOL;  Surgeon: Vida Rigger, MD;  Location: [facility] ENDOSCOPY;  Service: Gastroenterology;  Laterality: N/A;   ESOPHAGOGASTRODUODENOSCOPY (EGD) WITH PROPOFOL N/A 01/06/2023   Procedure: ESOPHAGOGASTRODUODENOSCOPY (EGD)  WITH PROPOFOL;  Surgeon: Vida Rigger, MD;  Location: [facility] ENDOSCOPY;  Service: Gastroenterology;  Laterality: N/A;   EYE SURGERY Bilateral    "to improve vision after cataract OR"   IR ANGIOGRAM FOLLOW UP STUDY  12/19/2020   IR ANGIOGRAM SELECTIVE EACH ADDITIONAL VESSEL  12/19/2020   IR ANGIOGRAM SELECTIVE EACH ADDITIONAL VESSEL  12/19/2020   IR ANGIOGRAM VISCERAL SELECTIVE   12/19/2020   IR EMBO ART  VEN HEMORR LYMPH EXTRAV  INC GUIDE ROADMAPPING  12/19/2020   IR US GUIDE VASC ACCESS RIGHT  12/19/2020   JOINT REPLACEMENT     POLYPECTOMY  05/04/2022   Procedure: POLYPECTOMY;  Surgeon: Kerin Salen, MD;  Location: [facility] ENDOSCOPY;  Service: Gastroenterology;;   STAPEDES SURGERY Left    "scraped stapedes because it was sticking when it wasn't suppose to"   TONSILLECTOMY AND ADENOIDECTOMY  1946   TOTAL KNEE ARTHROPLASTY Left ~ 2008   TOTAL KNEE ARTHROPLASTY  12/20/2011   Procedure: TOTAL KNEE ARTHROPLASTY;  Surgeon: Verlee Rossetti, MD;  Location: [facility] OR;  Service: Orthopedics;  Laterality: Right;  Right Total Knee Arthroplasty   TUBAL LIGATION  1980's    Allergies  Allergen Reactions   Morphine And Codeine Itching   Aspirin Other (See Comments)    Unknown reaction   Duloxetine Hcl Other (See Comments)    Unknown reaction   Tymlos [Abaloparatide] Other (See Comments)    Unknown reaction   Ventolin [Albuterol] Other (See Comments)    rapid heart beat   Cefadroxil Hives    Patient can take amoxicillin and cipro    Outpatient Encounter Medications as of 03/25/2023  Medication Sig   acetaminophen (TYLENOL) 650 MG CR tablet Take 1,300 mg by mouth in the morning and at bedtime.   albuterol (VENTOLIN HFA) 108 (90 Base) MCG/ACT inhaler Inhale 2 puffs into the lungs every 4 (four) hours as needed for wheezing or shortness of breath.   alendronate (FOSAMAX) 70 MG tablet Take 70 mg by mouth every Sunday. Take with a full glass of water on an empty stomach.   Ascorbic Acid (VITAMIN C) 1000 MG tablet Take 1,000 mg by mouth every morning.   Cholecalciferol (VITAMIN D3) 50 MCG (2000 UT) TABS Take 2,000 Units by mouth every morning.   docusate sodium (COLACE) 100 MG capsule Take 200 mg by mouth at bedtime.   dorzolamide-timolol (COSOPT) 22.3-6.8 MG/ML ophthalmic solution Place 1 drop into both eyes 2 (two) times daily.   ferrous gluconate (FERGON) 240 (27 FE) MG tablet Take 240  mg by mouth every morning. Take without food   gabapentin (NEURONTIN) 100 MG capsule Take 1 capsule (100 mg total) by mouth at bedtime.   latanoprost (XALATAN) 0.005 % ophthalmic solution Place 1 drop into both eyes at bedtime.   LIDOCAINE EX Apply topically.   METOPROLOL TARTRATE PO Take 6.25 mg by mouth in the morning and at bedtime.   Multiple Vitamins-Minerals (PRESERVISION AREDS 2) CAPS Take 1 capsule by mouth 2 (two) times daily.   Netarsudil Dimesylate (RHOPRESSA) 0.02 % SOLN Place 1 drop into both eyes at bedtime.   oxybutynin (DITROPAN-XL) 5 MG 24 hr tablet Take 5 mg by mouth daily.   pantoprazole (PROTONIX) 40 MG tablet Take 40 mg by mouth 2 (two) times daily.   polyethylene glycol (MIRALAX / GLYCOLAX) 17 g packet Take 8.5 g by mouth daily as needed (constipation).   POTASSIUM CHLORIDE ER PO Take 40 mEq by mouth daily.   Probiotic Product (ALIGN) 4 MG CAPS Take  4 mg by mouth at bedtime.   rOPINIRole (REQUIP) 0.5 MG tablet Take 0.5 mg by mouth as needed.   sertraline (ZOLOFT) 50 MG tablet Take 1.5 tablets (75 mg total) by mouth daily.   simvastatin (ZOCOR) 20 MG tablet Take 20 mg by mouth every evening.   Sodium Fluoride (PREVIDENT 5000 BOOSTER PLUS) 1.1 % PSTE Place 1 Application onto teeth See admin instructions. Brush on teeth with a toothbrush after evening mouth care. Spit out excess and do not rinse.   umeclidinium-vilanterol (ANORO ELLIPTA) 62.5-25 MCG/ACT AEPB Inhale 1 puff into the lungs every morning.   zinc oxide 20 % ointment Apply 1 Application topically See admin instructions. Apply topically to buttocks after every incontinent episode and as needed for redness   [DISCONTINUED] cetirizine (ZYRTEC) 10 MG tablet Take 1 tablet (10 mg total) by mouth at bedtime.   No facility-administered encounter medications on file as of 03/25/2023.    Review of Systems  Constitutional:  Negative for appetite change, chills, fatigue and fever.  HENT:  Negative for congestion, hearing  loss, rhinorrhea and sore throat.   Eyes: Negative.   Respiratory:  Negative for cough, shortness of breath and wheezing.   Cardiovascular:  Negative for chest pain, palpitations and leg swelling.  Gastrointestinal:  Positive for blood in stool. Negative for abdominal pain, constipation, diarrhea, nausea and vomiting.  Genitourinary:  Negative for dysuria.  Musculoskeletal:  Negative for arthralgias, back pain and myalgias.  Skin:  Negative for color change, rash and wound.  Neurological:  Negative for dizziness, weakness and headaches.  Psychiatric/Behavioral:  Negative for behavioral problems. The patient is not nervous/anxious.        Immunization History  Administered Date(s) Administered   Fluad Quad(high Dose 65+) 04/24/2022   Influenza, High Dose Seasonal PF 03/14/2018   PFIZER(Purple Top)SARS-COV-2 Vaccination 07/25/2019, 08/16/2019, 12/12/2021   Pneumococcal-Unspecified 03/02/2011   Tdap 03/12/2018   Zoster Recombinant(Shingrix) 10/08/2017, 12/09/2017   Pertinent  Health Maintenance Due  Topic Date Due   DEXA SCAN  Never done   INFLUENZA VACCINE  01/30/2023      05/07/2022   12:25 PM 05/17/2022   10:19 AM 07/12/2022   11:08 AM 10/30/2022   12:44 PM 01/22/2023    1:15 PM  Fall Risk  Falls in the past year?  1 0 0 0  Was there an injury with Fall?  1 0 0 0  Fall Risk Category Calculator  2 0 0 0  Fall Risk Category (Retired)  Moderate Low    (RETIRED) Patient Fall Risk Level Moderate fall risk Moderate fall risk Low fall risk    Patient at Risk for Falls Due to  History of fall(s) History of fall(s) History of fall(s);Impaired balance/gait;Impaired mobility History of fall(s);Impaired balance/gait  Fall risk Follow up  Falls evaluation completed Falls evaluation completed Falls evaluation completed;Education provided;Falls prevention discussed Falls evaluation completed;Education provided;Falls prevention discussed     Vitals:   03/25/23 1006  BP: 128/64  Pulse: 75   Resp: 20  Temp: 97.7 F (36.5 C)  SpO2: 97%  Weight: 184 lb 3.2 oz (83.6 kg)  Height: 5\' 1"  (1.549 m)   Body mass index is 34.8 kg/m.  Physical Exam Constitutional:      General: She is not in acute distress.    Appearance: She is obese.  HENT:     Head: Normocephalic and atraumatic.     Nose: Nose normal.     Mouth/Throat:     Mouth: Mucous membranes are moist.  Eyes:     Conjunctiva/sclera: Conjunctivae normal.  Cardiovascular:     Rate and Rhythm: Normal rate and regular rhythm.  Pulmonary:     Effort: Pulmonary effort is normal.     Breath sounds: Normal breath sounds.  Abdominal:     General: Bowel sounds are normal.     Palpations: Abdomen is soft.  Musculoskeletal:        General: Normal range of motion.     Cervical back: Normal range of motion.     Right lower leg: Edema present.     Left lower leg: Edema present.     Comments: BLE 2+edema  Skin:    General: Skin is warm and dry.  Neurological:     General: No focal deficit present.     Mental Status: She is alert and oriented to person, place, and time.  Psychiatric:        Mood and Affect: Mood normal.        Behavior: Behavior normal.        Thought Content: Thought content normal.        Judgment: Judgment normal.        Labs reviewed: Recent Labs    05/05/22 0008 05/05/22 0631 01/05/23 1120 01/06/23 0401 01/07/23 0506 01/14/23 0000  NA  --    < > 138 141 142 143  K  --    < > 3.9 3.7 3.3* 4.2  CL  --    < > 105 109 110 108  CO2  --    < > 25 24 25  31*  GLUCOSE  --    < > 110* 113* 114*  --   BUN  --    < > 22 17 12 9   CREATININE  --    < > 0.46 0.51 0.51 0.5  CALCIUM  --    < > 8.6* 8.5* 8.4* 9.0  MG 1.9  --   --   --   --   --    < > = values in this interval not displayed.   Recent Labs    05/02/22 1839 10/08/22 0000 01/05/23 1120 01/07/23 0506 01/14/23 0000  AST 18   < > 18 15 12*  ALT 14   < > 12 10 8   ALKPHOS 46   < > 37* 31* 40  BILITOT 0.4  --  0.2* 0.5  --   PROT  6.1*  --  6.5 5.8*  --   ALBUMIN 3.5   < > 3.5 3.3* 3.7   < > = values in this interval not displayed.   Recent Labs    01/05/23 1120 01/06/23 0401 01/07/23 0506 01/14/23 0000 02/07/23 0000  WBC 8.0 8.0 6.0 5.9 6.4  NEUTROABS 5.6  --  3.3 3,133.00 3,859.00  HGB 9.8* 9.0* 8.9* 9.1* 11.4*  HCT 31.1* 29.0* 28.6* 28* 35*  MCV 99.7 100.3* 100.7*  --   --   PLT 158 142* 137* 198 186   Lab Results  Component Value Date   TSH 2.47 10/08/2022   No results found for: "HGBA1C" Lab Results  Component Value Date   CHOL 147 02/18/2022   HDL 51 02/18/2022   LDLCALC 72 02/18/2022   TRIG 162 (A) 02/18/2022    Significant Diagnostic Results in last 30 days:  No results found.  Assessment/Plan  1. Bloody stools -  patient denies dizziness or weakness -  if bloody stools continue then she might need to go to hospital for  further evaluation, continue to monitor stools -  awaiting stat CBC  2. History of GI bleed -  continue Pantoprazole 40 mg BID  3. Iron deficiency anemia due to chronic blood loss Lab Results  Component Value Date   WBC 6.4 02/07/2023   HGB 11.4 (A) 02/07/2023   HCT 35 (A) 02/07/2023   MCV 100.7 (H) 01/07/2023   PLT 186 02/07/2023    -  continue Ferrous gluconate    Family/ staff Communication: Discussed plan of care with resident and charge nurse  Labs/tests ordered: awaiting result of CBC and BMP stat    Kenard Gower, DNP, MSN, FNP-BC [facility] [phone] (Monday-Friday 8:00 a.m. - 5:00 p.m.) [phone] (after hours)

## 2023-03-27 ENCOUNTER — Observation Stay (HOSPITAL_COMMUNITY)
Admission: EM | Admit: 2023-03-27 | Discharge: 2023-03-29 | Disposition: A | Discharge status: Home / Self Care | Payer: Medicare HMO | Attending: Internal Medicine | Admitting: Internal Medicine

## 2023-03-27 ENCOUNTER — Telehealth: Payer: Self-pay | Admitting: Family

## 2023-03-27 ENCOUNTER — Other Ambulatory Visit: Payer: Self-pay

## 2023-03-27 DIAGNOSIS — Z85828 Personal history of other malignant neoplasm of skin: Secondary | ICD-10-CM | POA: Insufficient documentation

## 2023-03-27 DIAGNOSIS — J9691 Respiratory failure, unspecified with hypoxia: Secondary | ICD-10-CM | POA: Diagnosis not present

## 2023-03-27 DIAGNOSIS — F411 Generalized anxiety disorder: Secondary | ICD-10-CM | POA: Insufficient documentation

## 2023-03-27 DIAGNOSIS — E785 Hyperlipidemia, unspecified: Secondary | ICD-10-CM | POA: Diagnosis not present

## 2023-03-27 DIAGNOSIS — K625 Hemorrhage of anus and rectum: Secondary | ICD-10-CM | POA: Diagnosis present

## 2023-03-27 DIAGNOSIS — Z79899 Other long term (current) drug therapy: Secondary | ICD-10-CM | POA: Diagnosis not present

## 2023-03-27 DIAGNOSIS — Z87891 Personal history of nicotine dependence: Secondary | ICD-10-CM | POA: Diagnosis not present

## 2023-03-27 DIAGNOSIS — I872 Venous insufficiency (chronic) (peripheral): Secondary | ICD-10-CM | POA: Diagnosis not present

## 2023-03-27 DIAGNOSIS — I10 Essential (primary) hypertension: Secondary | ICD-10-CM | POA: Insufficient documentation

## 2023-03-27 DIAGNOSIS — R1312 Dysphagia, oropharyngeal phase: Secondary | ICD-10-CM | POA: Diagnosis not present

## 2023-03-27 DIAGNOSIS — Z743 Need for continuous supervision: Secondary | ICD-10-CM | POA: Diagnosis not present

## 2023-03-27 DIAGNOSIS — D649 Anemia, unspecified: Secondary | ICD-10-CM | POA: Diagnosis present

## 2023-03-27 DIAGNOSIS — J449 Chronic obstructive pulmonary disease, unspecified: Secondary | ICD-10-CM | POA: Diagnosis not present

## 2023-03-27 DIAGNOSIS — Z96653 Presence of artificial knee joint, bilateral: Secondary | ICD-10-CM | POA: Insufficient documentation

## 2023-03-27 DIAGNOSIS — R41841 Cognitive communication deficit: Secondary | ICD-10-CM | POA: Diagnosis not present

## 2023-03-27 DIAGNOSIS — K922 Gastrointestinal hemorrhage, unspecified: Secondary | ICD-10-CM | POA: Diagnosis not present

## 2023-03-27 DIAGNOSIS — D500 Iron deficiency anemia secondary to blood loss (chronic): Secondary | ICD-10-CM | POA: Diagnosis not present

## 2023-03-27 DIAGNOSIS — Z8719 Personal history of other diseases of the digestive system: Secondary | ICD-10-CM

## 2023-03-27 DIAGNOSIS — R58 Hemorrhage, not elsewhere classified: Secondary | ICD-10-CM | POA: Diagnosis not present

## 2023-03-27 DIAGNOSIS — E559 Vitamin D deficiency, unspecified: Secondary | ICD-10-CM | POA: Diagnosis not present

## 2023-03-27 DIAGNOSIS — K219 Gastro-esophageal reflux disease without esophagitis: Secondary | ICD-10-CM | POA: Diagnosis present

## 2023-03-27 LAB — CBC WITH DIFFERENTIAL/PLATELET
Abs Immature Granulocytes: 0.02 10*3/uL (ref 0.00–0.07)
Basophils Absolute: 0 10*3/uL (ref 0.0–0.1)
Basophils Relative: 0 %
Eosinophils Absolute: 0.2 10*3/uL (ref 0.0–0.5)
Eosinophils Relative: 2 %
HCT: 34 % — ABNORMAL LOW (ref 36.0–46.0)
Hemoglobin: 10.5 g/dL — ABNORMAL LOW (ref 12.0–15.0)
Immature Granulocytes: 0 %
Lymphocytes Relative: 16 %
Lymphs Abs: 1.4 10*3/uL (ref 0.7–4.0)
MCH: 31 pg (ref 26.0–34.0)
MCHC: 30.9 g/dL (ref 30.0–36.0)
MCV: 100.3 fL — ABNORMAL HIGH (ref 80.0–100.0)
Monocytes Absolute: 0.8 10*3/uL (ref 0.1–1.0)
Monocytes Relative: 10 %
Neutro Abs: 6 10*3/uL (ref 1.7–7.7)
Neutrophils Relative %: 72 %
Platelets: 166 10*3/uL (ref 150–400)
RBC: 3.39 MIL/uL — ABNORMAL LOW (ref 3.87–5.11)
RDW: 14.7 % (ref 11.5–15.5)
WBC: 8.4 10*3/uL (ref 4.0–10.5)
nRBC: 0.2 % (ref 0.0–0.2)

## 2023-03-27 LAB — BASIC METABOLIC PANEL
Anion gap: 8 (ref 5–15)
BUN: 17 mg/dL (ref 8–23)
CO2: 26 mmol/L (ref 22–32)
Calcium: 9 mg/dL (ref 8.9–10.3)
Chloride: 105 mmol/L (ref 98–111)
Creatinine, Ser: 0.59 mg/dL (ref 0.44–1.00)
GFR, Estimated: >60 mL/min (ref >60)
Glucose, Bld: 117 mg/dL — ABNORMAL HIGH (ref 70–99)
Potassium: 4.1 mmol/L (ref 3.5–5.1)
Sodium: 139 mmol/L (ref 135–145)

## 2023-03-27 LAB — TYPE AND SCREEN

## 2023-03-27 LAB — APTT: aPTT: 27 seconds (ref 24–36)

## 2023-03-27 LAB — PROTIME-INR
INR: 1.1 (ref 0.8–1.2)
Prothrombin Time: 14.6 seconds (ref 11.4–15.2)

## 2023-03-27 LAB — POC OCCULT BLOOD, ED: Fecal Occult Bld: POSITIVE — AB

## 2023-03-27 NOTE — ED Provider Notes (Signed)
[place] EMERGENCY DEPARTMENT AT [name] Provider Note  CSN: [id number] Arrival date & time: 03/27/23 2200  Chief Complaint(s) Rectal Bleeding  HPI [name] is a 82 y.o. female with a past medical history listed below who presents to the emergency department with several days of bloody bowel movements.  Patient has a history of diverticulosis noted on colonoscopy last year, patient also has a history of gastritis noted on EGD in July of this year.  She was evaluated by a nurse practitioner at her independent living facility for several bouts of bloody bowel movements described as melenic stools.  They recommended monitoring and further evaluation if bowel movements do not improve.  Patient presented today for persistent bloody bowel movements.  She is unsure if they are melenic or hematochezia.  She denies any abdominal pain.  No nausea or vomiting.  No other physical complaints.  The history is provided by the patient and medical records.    Past Medical History Past Medical History:  Diagnosis Date   Anxiety    Arthritis    "bilateral knees, shoulders, elbows; neck, pretty widespread" (09/05/2017)   BPPV (benign paroxysmal positional vertigo)    Depression    GERD (gastroesophageal reflux disease)    Glaucoma, both eyes    Headache    "probably 2/month" (09/05/2017)   History of blood transfusion ~ 2008   "related to LGIB"   Hyperlipemia    Lower GI bleeding ~ 2008; 09/05/2017   "had to have blood transfusion"   Macular degeneration, bilateral    Osteopenia    Seasonal allergies    Skin cancer, basal cell 2001   "off my nose, left side"   Sleeping excessive    Tinnitus of both ears    Patient Active Problem List   Diagnosis Date Noted   History of GI diverticular bleed 03/28/2023   GAD (generalized anxiety disorder) 03/28/2023   Diastolic heart failure (HCC) 03/19/2023   Class 1 obesity with body mass index (BMI) of 34.0 to 34.9 in adult 03/19/2023    Thoracic aorta atherosclerosis (HCC) 03/19/2023   Melena 01/05/2023   Hypotension 05/02/2022   UTI (urinary tract infection) 02/05/2022   Restless leg syndrome 02/05/2022   Senile osteoporosis 02/05/2022   Hypertension 02/05/2022   Pulmonary nodule 1 cm or greater in diameter 02/05/2022   GERD (gastroesophageal reflux disease) 02/05/2022   Upper GI bleed 01/07/2022   Iron deficiency anemia 05/02/2021   Chronic anemia    DOE (dyspnea on exertion) 11/18/2017   COPD (chronic obstructive pulmonary disease) (HCC) 11/18/2017   Diverticulosis of intestine with bleeding 09/05/2017   Acute respiratory failure with hypoxia (HCC) 08/07/2017   Depression with anxiety 08/07/2017   Hyperlipidemia 08/07/2017   Macular degeneration 08/07/2017   Glaucoma 08/07/2017   Weakness 08/07/2017   Dehydration 08/07/2017   Lower GI bleeding 10/14/2016   Diverticulosis of colon 10/14/2016   Anxiety 10/14/2016   Lower GI bleed 10/14/2016   GI bleed    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 Authorizing Provider  acetaminophen (TYLENOL) 650 MG CR tablet Take 1,300 mg by mouth in the morning and at bedtime.    [provider]  albuterol (VENTOLIN HFA) 108 (90 Base) MCG/ACT inhaler Inhale 2 puffs into the lungs every 4 (four) hours as needed for wheezing  or shortness of breath.    [provider]  alendronate (FOSAMAX) 70 MG tablet Take 70 mg by mouth every Sunday. Take with a full glass of water on an empty stomach.    [provider]  Ascorbic Acid (VITAMIN C) 1000 MG tablet Take 1,000 mg by mouth every morning.    [provider]  Cholecalciferol (VITAMIN D3) 50 MCG (2000 UT) TABS Take 2,000 Units by mouth every morning.    [provider]  docusate sodium  (COLACE) 100 MG capsule Take 200 mg by mouth at bedtime.    [provider]  dorzolamide-timolol (COSOPT) 22.3-6.8 MG/ML ophthalmic solution Place 1 drop into both eyes 2 (two) times daily.    [provider]  ferrous gluconate (FERGON) 240 (27 FE) MG tablet Take 240 mg by mouth every morning. Take without food    [provider]  gabapentin (NEURONTIN) 100 MG capsule Take 1 capsule (100 mg total) by mouth at bedtime. 01/07/23   Rhetta Mura, MD  latanoprost (XALATAN) 0.005 % ophthalmic solution Place 1 drop into both eyes at bedtime. 02/13/18   [provider]  LIDOCAINE EX Apply topically.    [provider]  METOPROLOL TARTRATE PO Take 6.25 mg by mouth in the morning and at bedtime.    [provider]  Multiple Vitamins-Minerals (PRESERVISION AREDS 2) CAPS Take 1 capsule by mouth 2 (two) times daily.    [provider]  Netarsudil Dimesylate (RHOPRESSA) 0.02 % SOLN Place 1 drop into both eyes at bedtime.    [provider]  oxybutynin (DITROPAN-XL) 5 MG 24 hr tablet Take 5 mg by mouth daily. 11/22/22   [provider]  pantoprazole (PROTONIX) 40 MG tablet Take 40 mg by mouth 2 (two) times daily.    [provider]  polyethylene glycol (MIRALAX / GLYCOLAX) 17 g packet Take 8.5 g by mouth daily as needed (constipation).    [provider]  POTASSIUM CHLORIDE ER PO Take 40 mEq by mouth daily.    [provider]  Probiotic Product (ALIGN) 4 MG CAPS Take 4 mg by mouth at bedtime.    [provider]  rOPINIRole (REQUIP) 0.5 MG tablet Take 0.5 mg by mouth as needed.    [provider]  sertraline (ZOLOFT) 50 MG tablet Take 1.5 tablets (75 mg total) by mouth daily. 01/07/23   Rhetta Mura, MD  simvastatin (ZOCOR) 20 MG tablet Take 20 mg by mouth every evening.    [provider]  Sodium Fluoride (PREVIDENT 5000 BOOSTER PLUS) 1.1 % PSTE Place 1 Application onto  teeth See admin instructions. Brush on teeth with a toothbrush after evening mouth care. Spit out excess and do not rinse.    [provider]  umeclidinium-vilanterol (ANORO ELLIPTA) 62.5-25 MCG/ACT AEPB Inhale 1 puff into the lungs every morning.    [provider]  zinc oxide 20 % ointment Apply 1 Application topically See admin instructions. Apply topically to buttocks after every incontinent episode and as needed for redness    [provider]  Allergies Morphine and codeine, Aspirin, Duloxetine hcl, Tymlos [abaloparatide], Ventolin [albuterol], and Cefadroxil  Review of Systems Review of Systems  Gastrointestinal:  Positive for hematochezia.   As noted in HPI  Physical Exam Vital Signs  I have reviewed the triage vital signs BP 122/64 (BP Location: Left Arm)   Pulse 76   Temp 97.7 F (36.5 C) (Oral)   Resp 17   Ht 5\' 1"  (1.549 m)   Wt 83.9 kg   SpO2 93%   BMI 34.96 kg/m   Physical Exam Vitals reviewed. Exam conducted with a chaperone present.  Constitutional:      General: She is not in acute distress.    Appearance: She is well-developed. She is not diaphoretic.  HENT:     Head: Normocephalic and atraumatic.     Right Ear: External ear normal.     Left Ear: External ear normal.     Nose: Nose normal.  Eyes:     General: No scleral icterus.    Conjunctiva/sclera: Conjunctivae normal.  Neck:     Trachea: Phonation normal.  Cardiovascular:     Rate and Rhythm: Normal rate and regular rhythm.  Pulmonary:     Effort: Pulmonary effort is normal. No respiratory distress.     Breath sounds: No stridor.  Abdominal:     General: There is no distension.     Tenderness: There is no abdominal tenderness.  Genitourinary:    Rectum: Guaiac result positive (with brown stool).  Musculoskeletal:        General: Normal  range of motion.     Cervical back: Normal range of motion.  Neurological:     Mental Status: She is alert and oriented to person, place, and time.  Psychiatric:        Behavior: Behavior normal.     ED Results and Treatments Labs (all labs ordered are listed, but only abnormal results are displayed) Labs Reviewed  CBC WITH DIFFERENTIAL/PLATELET - Abnormal; Notable for the following components:      Result Value   RBC 3.39 (*)    Hemoglobin 10.5 (*)    HCT 34.0 (*)    MCV 100.3 (*)    All other components within normal limits  BASIC METABOLIC PANEL - Abnormal; Notable for the following components:   Glucose, Bld 117 (*)    All other components within normal limits  POC OCCULT BLOOD, ED - Abnormal; Notable for the following components:   Fecal Occult Bld POSITIVE (*)    All other components within normal limits  APTT  PROTIME-INR  HEMOGLOBIN AND HEMATOCRIT, BLOOD  HEMOGLOBIN AND HEMATOCRIT, BLOOD  HEMOGLOBIN AND HEMATOCRIT, BLOOD  COMPREHENSIVE METABOLIC PANEL  CBC  TYPE AND SCREEN  PREPARE RBC (CROSSMATCH)                                                                                                                         EKG  EKG Interpretation Date/Time:  Thursday March 27 2023 23:59:46 EDT  Ventricular Rate:  77 PR Interval:  146 QRS Duration:  146 QT Interval:  432 QTC Calculation: 489 R Axis:   92  Text Interpretation: Sinus rhythm RBBB and LPFB No acute changes Confirmed by Drema Pry [phone]) on 03/28/2023 3:38:45 AM       Radiology No results found.  Medications Ordered in ED Medications  simvastatin (ZOCOR) tablet 20 mg (has no administration in time range)  sertraline (ZOLOFT) tablet 75 mg (has no administration in time range)  docusate sodium (COLACE) capsule 200 mg (has no administration in time range)  pantoprazole (PROTONIX) 80 mg /NS 100 mL IVPB (has no administration in time range)  polyethylene glycol (MIRALAX / GLYCOLAX) packet 8.5  g (has no administration in time range)  oxybutynin (DITROPAN-XL) 24 hr tablet 5 mg (has no administration in time range)  ferrous gluconate (FERGON) tablet 240 mg (has no administration in time range)  umeclidinium-vilanterol (ANORO ELLIPTA) 62.5-25 MCG/ACT 1 puff (has no administration in time range)  dextrose 5 % in lactated ringers infusion (has no administration in time range)  sodium chloride flush (NS) 0.9 % injection 3 mL (has no administration in time range)  sodium chloride flush (NS) 0.9 % injection 3 mL (has no administration in time range)  0.9 %  sodium chloride infusion (has no administration in time range)  acetaminophen (TYLENOL) tablet 650 mg (has no administration in time range)    Or  acetaminophen (TYLENOL) suppository 650 mg (has no administration in time range)  ondansetron (ZOFRAN) tablet 4 mg (has no administration in time range)    Or  ondansetron (ZOFRAN) injection 4 mg (has no administration in time range)  0.9 %  sodium chloride infusion (Manually program via Guardrails IV Fluids) (has no administration in time range)  metoprolol tartrate (LOPRESSOR) injection 5 mg (has no administration in time range)  levalbuterol (XOPENEX) nebulizer solution 0.63 mg (has no administration in time range)   Procedures Procedures  (including critical care time) Medical Decision Making / ED Course   Medical Decision Making Amount and/or Complexity of Data Reviewed Labs: ordered. Decision-making details documented in ED Course. ECG/medicine tests: ordered and independent interpretation performed. Decision-making details documented in ED Course.  Risk Decision regarding hospitalization.    Patient presents for bloody bowel movements. History of the same Previously related to gastritis or diverticulosis. Abdomen is benign Hemoccult is positive.  Stools are brown and not grossly positive. CBC with hemoglobin of 10.5.  Previous baseline around 9 however patient's last  hemoglobin was 11.4 a month ago. BMP without significant electrolyte derangements.  BUN within normal limits.  No renal insufficiency INR normal  Case discussed with medicine for admission to trend hemoglobins.    Final Clinical Impression(s) / ED Diagnoses Final diagnoses:  Acute GI bleeding    This chart was dictated using voice recognition software.  Despite best efforts to proofread,  errors can occur which can change the documentation meaning.    Nira Conn, MD 03/28/23 [phone]

## 2023-03-27 NOTE — Telephone Encounter (Signed)
[facility] facility nurse called reports patient has dark and bright red blood in the stool. Order given to send patient to ED for further evaluation due to history of GI bleed.

## 2023-03-27 NOTE — ED Triage Notes (Signed)
BIBA from [facility] for blood in stool x 2 days. 128/70 BP 68 HR 95% room air

## 2023-03-27 NOTE — ED Notes (Signed)
Facility paperwork at bedside including DNR paper

## 2023-03-28 ENCOUNTER — Encounter (HOSPITAL_COMMUNITY): Payer: Self-pay | Admitting: Internal Medicine

## 2023-03-28 DIAGNOSIS — F411 Generalized anxiety disorder: Secondary | ICD-10-CM | POA: Diagnosis not present

## 2023-03-28 DIAGNOSIS — K922 Gastrointestinal hemorrhage, unspecified: Secondary | ICD-10-CM | POA: Diagnosis not present

## 2023-03-28 DIAGNOSIS — E785 Hyperlipidemia, unspecified: Secondary | ICD-10-CM

## 2023-03-28 DIAGNOSIS — D649 Anemia, unspecified: Secondary | ICD-10-CM

## 2023-03-28 DIAGNOSIS — K219 Gastro-esophageal reflux disease without esophagitis: Secondary | ICD-10-CM

## 2023-03-28 DIAGNOSIS — J410 Simple chronic bronchitis: Secondary | ICD-10-CM | POA: Diagnosis not present

## 2023-03-28 DIAGNOSIS — D62 Acute posthemorrhagic anemia: Secondary | ICD-10-CM | POA: Diagnosis not present

## 2023-03-28 DIAGNOSIS — Z8719 Personal history of other diseases of the digestive system: Secondary | ICD-10-CM

## 2023-03-28 DIAGNOSIS — K625 Hemorrhage of anus and rectum: Secondary | ICD-10-CM | POA: Diagnosis not present

## 2023-03-28 DIAGNOSIS — K921 Melena: Secondary | ICD-10-CM | POA: Diagnosis not present

## 2023-03-28 LAB — COMPREHENSIVE METABOLIC PANEL
ALT: 11 U/L (ref 0–44)
AST: 15 U/L (ref 15–41)
Albumin: 3.5 g/dL (ref 3.5–5.0)
Alkaline Phosphatase: 39 U/L (ref 38–126)
Anion gap: 9 (ref 5–15)
BUN: 13 mg/dL (ref 8–23)
CO2: 25 mmol/L (ref 22–32)
Calcium: 8.7 mg/dL — ABNORMAL LOW (ref 8.9–10.3)
Chloride: 108 mmol/L (ref 98–111)
Creatinine, Ser: 0.32 mg/dL — ABNORMAL LOW (ref 0.44–1.00)
GFR, Estimated: >60 mL/min (ref >60)
Glucose, Bld: 111 mg/dL — ABNORMAL HIGH (ref 70–99)
Potassium: 3.5 mmol/L (ref 3.5–5.1)
Sodium: 142 mmol/L (ref 135–145)
Total Bilirubin: 0.8 mg/dL (ref 0.3–1.2)
Total Protein: 6.1 g/dL — ABNORMAL LOW (ref 6.5–8.1)

## 2023-03-28 LAB — CBC
HCT: 31.2 % — ABNORMAL LOW (ref 36.0–46.0)
Hemoglobin: 9.7 g/dL — ABNORMAL LOW (ref 12.0–15.0)
MCH: 31.4 pg (ref 26.0–34.0)
MCHC: 31.1 g/dL (ref 30.0–36.0)
MCV: 101 fL — ABNORMAL HIGH (ref 80.0–100.0)
Platelets: 138 10*3/uL — ABNORMAL LOW (ref 150–400)
RBC: 3.09 MIL/uL — ABNORMAL LOW (ref 3.87–5.11)
RDW: 14.7 % (ref 11.5–15.5)
WBC: 5.7 10*3/uL (ref 4.0–10.5)
nRBC: 0 % (ref 0.0–0.2)

## 2023-03-28 LAB — HEMOGLOBIN AND HEMATOCRIT, BLOOD
HCT: 32 % — ABNORMAL LOW (ref 36.0–46.0)
HCT: 31.2 % — ABNORMAL LOW (ref 36.0–46.0)
HCT: 35 % — ABNORMAL LOW (ref 36.0–46.0)
Hemoglobin: 10.1 g/dL — ABNORMAL LOW (ref 12.0–15.0)
Hemoglobin: 9.8 g/dL — ABNORMAL LOW (ref 12.0–15.0)
Hemoglobin: 10.9 g/dL — ABNORMAL LOW (ref 12.0–15.0)

## 2023-03-28 LAB — PREPARE RBC (CROSSMATCH)

## 2023-03-28 MED ORDER — PANTOPRAZOLE SODIUM 40 MG IV SOLR
40.0000 mg | Freq: Two times a day (BID) | INTRAVENOUS | Status: DC
  Administered 2023-03-28 – 2023-03-29 (×2): 40 mg via INTRAVENOUS
  Filled 2023-03-28 (×2): qty 10

## 2023-03-28 MED ORDER — ONDANSETRON HCL 4 MG/2ML IJ SOLN: 4.0000 mg | Freq: Four times a day (QID) | INTRAMUSCULAR | Status: DC | PRN

## 2023-03-28 MED ORDER — SODIUM CHLORIDE 0.9% FLUSH: 3.0000 mL | INTRAVENOUS | Status: DC | PRN

## 2023-03-28 MED ORDER — OXYBUTYNIN CHLORIDE ER 5 MG PO TB24
5.0000 mg | ORAL_TABLET | Freq: Every day | ORAL | Status: DC
  Administered 2023-03-28 – 2023-03-29 (×2): 5 mg via ORAL
  Filled 2023-03-28 (×2): qty 1

## 2023-03-28 MED ORDER — METOPROLOL TARTRATE 5 MG/5ML IV SOLN: 5.0000 mg | Freq: Three times a day (TID) | INTRAVENOUS | Status: DC | PRN

## 2023-03-28 MED ORDER — ALBUTEROL SULFATE (2.5 MG/3ML) 0.083% IN NEBU: 2.5000 mg | INHALATION_SOLUTION | RESPIRATORY_TRACT | Status: DC | PRN

## 2023-03-28 MED ORDER — METOPROLOL TARTRATE 25 MG PO TABS: 12.5000 mg | ORAL_TABLET | Freq: Two times a day (BID) | ORAL | Status: DC

## 2023-03-28 MED ORDER — SIMVASTATIN 20 MG PO TABS
20.0000 mg | ORAL_TABLET | Freq: Every evening | ORAL | Status: DC
  Administered 2023-03-28: 20 mg via ORAL
  Filled 2023-03-28: qty 1

## 2023-03-28 MED ORDER — ACETAMINOPHEN 325 MG PO TABS
650.0000 mg | ORAL_TABLET | Freq: Four times a day (QID) | ORAL | Status: DC | PRN
  Administered 2023-03-28: 650 mg via ORAL
  Filled 2023-03-28 (×2): qty 2

## 2023-03-28 MED ORDER — DEXTROSE IN LACTATED RINGERS 5 % IV SOLN: INTRAVENOUS | Status: AC

## 2023-03-28 MED ORDER — PANTOPRAZOLE 80MG IVPB - SIMPLE MED
80.0000 mg | Freq: Two times a day (BID) | INTRAVENOUS | Status: DC
  Administered 2023-03-28: 80 mg via INTRAVENOUS
  Filled 2023-03-28: qty 80

## 2023-03-28 MED ORDER — UMECLIDINIUM-VILANTEROL 62.5-25 MCG/ACT IN AEPB
1.0000 | INHALATION_SPRAY | Freq: Every day | RESPIRATORY_TRACT | Status: DC
  Administered 2023-03-28 – 2023-03-29 (×2): 1 via RESPIRATORY_TRACT
  Filled 2023-03-28: qty 14

## 2023-03-28 MED ORDER — LEVALBUTEROL HCL 0.63 MG/3ML IN NEBU: 0.6300 mg | INHALATION_SOLUTION | Freq: Four times a day (QID) | RESPIRATORY_TRACT | Status: DC | PRN

## 2023-03-28 MED ORDER — DOCUSATE SODIUM 100 MG PO CAPS
200.0000 mg | ORAL_CAPSULE | Freq: Every day | ORAL | Status: DC
  Administered 2023-03-28: 200 mg via ORAL
  Filled 2023-03-28: qty 2

## 2023-03-28 MED ORDER — METOPROLOL TARTRATE 25 MG PO TABS
12.5000 mg | ORAL_TABLET | Freq: Two times a day (BID) | ORAL | Status: DC
Start: 2023-03-28 — End: 2023-03-28

## 2023-03-28 MED ORDER — FERROUS GLUCONATE 324 (38 FE) MG PO TABS
324.0000 mg | ORAL_TABLET | Freq: Every morning | ORAL | Status: DC
  Administered 2023-03-28 – 2023-03-29 (×2): 324 mg via ORAL
  Filled 2023-03-28 (×3): qty 1

## 2023-03-28 MED ORDER — POLYETHYLENE GLYCOL 3350 17 G PO PACK: 8.5000 g | PACK | Freq: Every day | ORAL | Status: DC | PRN

## 2023-03-28 MED ORDER — ONDANSETRON HCL 4 MG PO TABS: 4.0000 mg | ORAL_TABLET | Freq: Four times a day (QID) | ORAL | Status: DC | PRN

## 2023-03-28 MED ORDER — PANTOPRAZOLE 80MG IVPB - SIMPLE MED: 80.0000 mg | Freq: Two times a day (BID) | INTRAVENOUS | Status: DC

## 2023-03-28 MED ORDER — SODIUM CHLORIDE 0.9 % IV SOLN: 250.0000 mL | INTRAVENOUS | Status: DC | PRN

## 2023-03-28 MED ORDER — ALBUTEROL SULFATE HFA 108 (90 BASE) MCG/ACT IN AERS: 2.0000 | INHALATION_SPRAY | RESPIRATORY_TRACT | Status: DC | PRN

## 2023-03-28 MED ORDER — SODIUM CHLORIDE 0.9% IV SOLUTION: Freq: Once | INTRAVENOUS | Status: DC

## 2023-03-28 MED ORDER — ACETAMINOPHEN 650 MG RE SUPP: 650.0000 mg | Freq: Four times a day (QID) | RECTAL | Status: DC | PRN

## 2023-03-28 MED ORDER — SERTRALINE HCL 50 MG PO TABS
75.0000 mg | ORAL_TABLET | Freq: Every day | ORAL | Status: DC
  Administered 2023-03-28 – 2023-03-29 (×2): 75 mg via ORAL
  Filled 2023-03-28 (×2): qty 1

## 2023-03-28 MED ORDER — SODIUM CHLORIDE 0.9% FLUSH
3.0000 mL | Freq: Two times a day (BID) | INTRAVENOUS | Status: DC
  Administered 2023-03-28 – 2023-03-29 (×3): 3 mL via INTRAVENOUS

## 2023-03-28 NOTE — H&P (Addendum)
History and Physical    [name] XLK:[id number] DOB: 11/05/1940 DOA: 03/27/2023  PCP: Octavia Heir, NP   Patient coming from: ALF   Chief Complaint:  Chief Complaint  Patient presents with   Rectal Bleeding    HPI:  [name] is a 82 y.o. female with medical history significant of diverticular bleeding with embolization 2022, COPD on 2 L oxygen at baseline, GERD, hyperlipidemia  and recent hospital admission in August 2024 due to upper GI bleed underwent EGD showed gastritis presented to emergency department for evaluation for blood in her stool.  Patient reported that she cannot bend it to see how her stools if it has blood or not but she have noticed blood on her toilet paper after the bowel movement for last 2 days. Per chart review of [facility] nurse reported patient has 2 bloody stool and stool color is dark/tarry how were unable to verify with patient at the bedside. Patient denies any abdominal pain, nausea, vomiting, fever and diarrhea.  Denies any chest pain, shortness of breath, palpitation, headache and dizziness.  No other complaint at this time.  Patient has colonoscopy in 05/2022 which showed blood throughout the colon without source pinpoint and CT negative.  Diffuse diverticulosis noted and 2 sessile serrated polyp removed. EGD in July 2023 showed gastric polyp without any source of melena. EGD in July 2024 showed multiple gastric polyps, duodenal bulb and evidence of gastritis.    ED Course:  Presentation to ER patient is hemodynamically stable Fecal occult blood positive. Normal PT/INR. Type and screen showed O+ blood group. BMP grossly unremarkable. CBC showed WBC 0.4, RBC 3.39, hemoglobin 10.1 (baseline around 9-10), hematocrit 34, MCV 100, and platelet 166.  ER physician Dr. Eudelia Bunch reporting that patient has brown colored stool and it is Hemoccult positive.  Hospitalist has been contacted for further evaluation management of GI  bleed.  Review of Systems:  Review of Systems  Constitutional:  Negative for chills, fever, malaise/fatigue and weight loss.  HENT:  Positive for hearing loss.   Respiratory:  Negative for cough.   Cardiovascular:  Negative for chest pain and palpitations.  Gastrointestinal:  Positive for blood in stool. Negative for abdominal pain, constipation, diarrhea, heartburn, melena, nausea and vomiting.  Musculoskeletal:  Negative for joint pain.  Neurological:  Negative for dizziness and headaches.  Endo/Heme/Allergies:  Does not bruise/bleed easily.  Psychiatric/Behavioral:  The patient is not nervous/anxious.     Past Medical History:  Diagnosis Date   Anxiety    Arthritis    "bilateral knees, shoulders, elbows; neck, pretty widespread" (09/05/2017)   BPPV (benign paroxysmal positional vertigo)    Depression    GERD (gastroesophageal reflux disease)    Glaucoma, both eyes    Headache    "probably 2/month" (09/05/2017)   History of blood transfusion ~ 2008   "related to LGIB"   Hyperlipemia    Lower GI bleeding ~ 2008; 09/05/2017   "had to have blood transfusion"   Macular degeneration, bilateral    Osteopenia    Seasonal allergies    Skin cancer, basal cell 2001   "off my nose, left side"   Sleeping excessive    Tinnitus of both ears     Past Surgical History:  Procedure Laterality Date   BALLOON DILATION N/A 05/08/2021   Procedure: BALLOON DILATION;  Surgeon: Kerin Salen, MD;  Location: [facility] ENDOSCOPY;  Service: Gastroenterology;  Laterality: N/A;   BASAL CELL CARCINOMA EXCISION  2001   "off  my nose, left side"   BIOPSY  05/08/2021   Procedure: BIOPSY;  Surgeon: Kerin Salen, MD;  Location: [facility] ENDOSCOPY;  Service: Gastroenterology;;   BLEPHAROPLASTY Bilateral    CATARACT EXTRACTION W/ INTRAOCULAR LENS  IMPLANT, BILATERAL Bilateral 1990's   COLONOSCOPY WITH PROPOFOL N/A 05/04/2022   Procedure: COLONOSCOPY WITH PROPOFOL;  Surgeon: Kerin Salen, MD;  Location: [facility] ENDOSCOPY;  Service:  Gastroenterology;  Laterality: N/A;   ESOPHAGOGASTRODUODENOSCOPY (EGD) WITH PROPOFOL N/A 05/08/2021   Procedure: ESOPHAGOGASTRODUODENOSCOPY (EGD) WITH PROPOFOL;  Surgeon: Kerin Salen, MD;  Location: [facility] ENDOSCOPY;  Service: Gastroenterology;  Laterality: N/A;   ESOPHAGOGASTRODUODENOSCOPY (EGD) WITH PROPOFOL N/A 01/11/2022   Procedure: ESOPHAGOGASTRODUODENOSCOPY (EGD) WITH PROPOFOL;  Surgeon: Vida Rigger, MD;  Location: [facility] ENDOSCOPY;  Service: Gastroenterology;  Laterality: N/A;   ESOPHAGOGASTRODUODENOSCOPY (EGD) WITH PROPOFOL N/A 01/06/2023   Procedure: ESOPHAGOGASTRODUODENOSCOPY (EGD) WITH PROPOFOL;  Surgeon: Vida Rigger, MD;  Location: [facility] ENDOSCOPY;  Service: Gastroenterology;  Laterality: N/A;   EYE SURGERY Bilateral    "to improve vision after cataract OR"   IR ANGIOGRAM FOLLOW UP STUDY  12/19/2020   IR ANGIOGRAM SELECTIVE EACH ADDITIONAL VESSEL  12/19/2020   IR ANGIOGRAM SELECTIVE EACH ADDITIONAL VESSEL  12/19/2020   IR ANGIOGRAM VISCERAL SELECTIVE  12/19/2020   IR EMBO ART  VEN HEMORR LYMPH EXTRAV  INC GUIDE ROADMAPPING  12/19/2020   IR US GUIDE VASC ACCESS RIGHT  12/19/2020   JOINT REPLACEMENT     POLYPECTOMY  05/04/2022   Procedure: POLYPECTOMY;  Surgeon: Kerin Salen, MD;  Location: [facility] ENDOSCOPY;  Service: Gastroenterology;;   STAPEDES SURGERY Left    "scraped stapedes because it was sticking when it wasn't suppose to"   TONSILLECTOMY AND ADENOIDECTOMY  1946   TOTAL KNEE ARTHROPLASTY Left ~ 2008   TOTAL KNEE ARTHROPLASTY  12/20/2011   Procedure: TOTAL KNEE ARTHROPLASTY;  Surgeon: Verlee Rossetti, MD;  Location: [facility] OR;  Service: Orthopedics;  Laterality: Right;  Right Total Knee Arthroplasty   TUBAL LIGATION  1980's     reports that she quit smoking about 30 years ago. Her smoking use included cigarettes. She started smoking about 67 years ago. She has a 27.8 pack-year smoking history. She has never used smokeless tobacco. She reports current alcohol use of about 7.0 standard drinks of alcohol  per week. She reports that she does not use drugs.  Allergies  Allergen Reactions   Morphine And Codeine Itching   Aspirin Other (See Comments)    Unknown reaction   Duloxetine Hcl Other (See Comments)    Unknown reaction   Tymlos [Abaloparatide] Other (See Comments)    Unknown reaction   Ventolin [Albuterol] Other (See Comments)    rapid heart beat   Cefadroxil Hives    Patient can take amoxicillin and cipro    Family History  Problem Relation Age of Onset   Heart attack Mother    Heart disease Father    Heart failure Father    Bladder Cancer Father    Supraventricular tachycardia Sister    Hypertension Sister     Prior to Admission medications   Medication Sig Start Date End Date Taking? Authorizing Provider  acetaminophen (TYLENOL) 650 MG CR tablet Take 1,300 mg by mouth in the morning and at bedtime.    [provider]  albuterol (VENTOLIN HFA) 108 (90 Base) MCG/ACT inhaler Inhale 2 puffs into the lungs every 4 (four) hours as needed for wheezing or shortness of breath.    [provider]  alendronate (FOSAMAX) 70 MG tablet Take  70 mg by mouth every Sunday. Take with a full glass of water on an empty stomach.    [provider]  Ascorbic Acid (VITAMIN C) 1000 MG tablet Take 1,000 mg by mouth every morning.    [provider]  Cholecalciferol (VITAMIN D3) 50 MCG (2000 UT) TABS Take 2,000 Units by mouth every morning.    [provider]  docusate sodium (COLACE) 100 MG capsule Take 200 mg by mouth at bedtime.    [provider]  dorzolamide-timolol (COSOPT) 22.3-6.8 MG/ML ophthalmic solution Place 1 drop into both eyes 2 (two) times daily.    [provider]  ferrous gluconate (FERGON) 240 (27 FE) MG tablet Take 240 mg by mouth every morning. Take without food    [provider]  gabapentin (NEURONTIN) 100 MG capsule Take 1 capsule (100 mg total) by mouth at bedtime. 01/07/23   Rhetta Mura, MD   latanoprost (XALATAN) 0.005 % ophthalmic solution Place 1 drop into both eyes at bedtime. 02/13/18   [provider]  LIDOCAINE EX Apply topically.    [provider]  METOPROLOL TARTRATE PO Take 6.25 mg by mouth in the morning and at bedtime.    [provider]  Multiple Vitamins-Minerals (PRESERVISION AREDS 2) CAPS Take 1 capsule by mouth 2 (two) times daily.    [provider]  Netarsudil Dimesylate (RHOPRESSA) 0.02 % SOLN Place 1 drop into both eyes at bedtime.    [provider]  oxybutynin (DITROPAN-XL) 5 MG 24 hr tablet Take 5 mg by mouth daily. 11/22/22   [provider]  pantoprazole (PROTONIX) 40 MG tablet Take 40 mg by mouth 2 (two) times daily.    [provider]  polyethylene glycol (MIRALAX / GLYCOLAX) 17 g packet Take 8.5 g by mouth daily as needed (constipation).    [provider]  POTASSIUM CHLORIDE ER PO Take 40 mEq by mouth daily.    [provider]  Probiotic Product (ALIGN) 4 MG CAPS Take 4 mg by mouth at bedtime.    [provider]  rOPINIRole (REQUIP) 0.5 MG tablet Take 0.5 mg by mouth as needed.    [provider]  sertraline (ZOLOFT) 50 MG tablet Take 1.5 tablets (75 mg total) by mouth daily. 01/07/23   Rhetta Mura, MD  simvastatin (ZOCOR) 20 MG tablet Take 20 mg by mouth every evening.    [provider]  Sodium Fluoride (PREVIDENT 5000 BOOSTER PLUS) 1.1 % PSTE Place 1 Application onto teeth See admin instructions. Brush on teeth with a toothbrush after evening mouth care. Spit out excess and do not rinse.    [provider]  umeclidinium-vilanterol (ANORO ELLIPTA) 62.5-25 MCG/ACT AEPB Inhale 1 puff into the lungs every morning.    [provider]  zinc oxide 20 % ointment Apply 1 Application topically See admin instructions. Apply topically to buttocks after every incontinent episode and as needed for redness    [provider]      Physical Exam: Vitals:   03/27/23 2222 03/27/23 2225 03/27/23 2330 03/28/23 0214  BP: 130/69  121/63 122/64  Pulse: 65  78 76  Resp: 16   17  Temp: 98.2 F (36.8 C)   97.7 F (36.5 C)  TempSrc: Oral   Oral  SpO2: 96%  93% 93%  Weight:  83.9 kg    Height:  5\' 1"  (1.549 m)      Physical Exam Constitutional:      General: She is not in  acute distress.    Appearance: She is not ill-appearing.  HENT:     Mouth/Throat:     Mouth: Mucous membranes are moist.  Eyes:     Pupils: Pupils are equal, round, and reactive to light.  Cardiovascular:     Rate and Rhythm: Normal rate and regular rhythm.     Pulses: Normal pulses.     Heart sounds: Normal heart sounds.  Pulmonary:     Effort: Pulmonary effort is normal.     Breath sounds: Normal breath sounds.  Abdominal:     General: Bowel sounds are normal. There is no distension.     Tenderness: There is no abdominal tenderness. There is no rebound.  Musculoskeletal:     Cervical back: Neck supple.  Skin:    General: Skin is dry.     Capillary Refill: Capillary refill takes less than 2 seconds.  Neurological:     Mental Status: She is oriented to person, place, and time.  Psychiatric:        Mood and Affect: Mood normal.        Thought Content: Thought content normal.        Judgment: Judgment normal.      Labs on Admission: I have personally reviewed following labs and imaging studies  CBC: Recent Labs  Lab 03/27/23 2234  WBC 8.4  NEUTROABS 6.0  HGB 10.5*  HCT 34.0*  MCV 100.3*  PLT 166   Basic Metabolic Panel: Recent Labs  Lab 03/27/23 2234  NA 139  K 4.1  CL 105  CO2 26  GLUCOSE 117*  BUN 17  CREATININE 0.59  CALCIUM 9.0   GFR: Estimated Creatinine Clearance: 51.4 mL/min (by C-G formula based on SCr of 0.59 mg/dL). Liver Function Tests: No results for input(s): "AST", "ALT", "ALKPHOS", "BILITOT", "PROT", "ALBUMIN" in the last 168 hours. No results for input(s): "LIPASE", "AMYLASE" in the last  168 hours. No results for input(s): "AMMONIA" in the last 168 hours. Coagulation Profile: Recent Labs  Lab 03/27/23 2234  INR 1.1   Cardiac Enzymes: No results for input(s): "CKTOTAL", "CKMB", "CKMBINDEX", "TROPONINI", "TROPONINIHS" in the last 168 hours. BNP (last 3 results) No results for input(s): "BNP" in the last 8760 hours. HbA1C: No results for input(s): "HGBA1C" in the last 72 hours. CBG: No results for input(s): "GLUCAP" in the last 168 hours. Lipid Profile: No results for input(s): "CHOL", "HDL", "LDLCALC", "TRIG", "CHOLHDL", "LDLDIRECT" in the last 72 hours. Thyroid Function Tests: No results for input(s): "TSH", "T4TOTAL", "FREET4", "T3FREE", "THYROIDAB" in the last 72 hours. Anemia Panel: No results for input(s): "VITAMINB12", "FOLATE", "FERRITIN", "TIBC", "IRON", "RETICCTPCT" in the last 72 hours. Urine analysis:    Component Value Date/Time   COLORURINE YELLOW 01/08/2022 0500   APPEARANCEUR HAZY (A) 01/08/2022 0500   LABSPEC 1.013 01/08/2022 0500   PHURINE 5.0 01/08/2022 0500   GLUCOSEU NEGATIVE 01/08/2022 0500   HGBUR NEGATIVE 01/08/2022 0500   BILIRUBINUR NEGATIVE 01/08/2022 0500   KETONESUR NEGATIVE 01/08/2022 0500   PROTEINUR NEGATIVE 01/08/2022 0500   UROBILINOGEN 0.2 10/20/2007 2015   NITRITE POSITIVE (A) 01/08/2022 0500   LEUKOCYTESUR MODERATE (A) 01/08/2022 0500    Radiological Exams on Admission: I have personally reviewed images No results found.  EKG: EKG showed normal sinus rhythm heart rate 77.  RBB and left posterior fascicular block.  No ST and T wave abnormality.    Assessment/Plan: Principal Problem:   GI bleed Active Problems:   History of GI diverticular bleed   Hyperlipidemia  COPD (chronic obstructive pulmonary disease) (HCC)   Chronic anemia   GERD (gastroesophageal reflux disease)   GAD (generalized anxiety disorder)    Assessment and Plan: GI bleed History of diverticular bleed History gastritis GERD -Patient  presenting from the nursing home with noticing blood on her toilet paper and in the facility provider noticed black tarry stool unable to verify with patient.  -In the ER patient has brown-colored stool and Hemoccult positive. - Hemodynamically stable - CBC showing hemoglobin 10.5 and hematocrit 34.  Baseline hemoglobin between 8-9 however since August hemoglobin dropped 1 unit. - Patient has previous hospital admission for GI bleed. -Patient has colonoscopy in 05/2022 which showed blood throughout the colon without source pinpoint and CT negative.  Diffuse diverticulosis noted and 2 sessile serrated polyp removed. EGD in July 2023 showed gastric polyp without any source of melena. EGD in July 2024 showed multiple gastric polyps, normal duodenal bulb and evidence of gastritis. -With the concern for GI bleed admitting patient for further workup. - Continue to monitor H&H every 3 times daily and transfuse as needed if hemoglobin drops less than 7. -Preparing 1 unit of blood and will transfuse if hemoglobin less than 7 or development of active GI bleed with hemodynamic instability. - Continue Protonix 80 mg IV twice daily. -Keeping patient n.p.o. - Continue maintenance fluid D5 LR 100 cc/h for next 24 hours. - Consulted [facility] Dr. Bosie Clos for further evaluation.  Will follow-up with GI for recommendation. -Continue cardiac monitoring.  Essential hypertension -At home patient is on metoprolol 6.25 mg twice daily this dose is not available in our facility. - Patient heart rate in between 65-76 and blood pressure 122/64 - As I am not able to order metoprolol 6.25 mg twice daily starting IV Lopressor 5 mg every 8 hour as needed for heart rate above 110. -Continue cardiac monitoring -On discharge needs to resume metoprolol 6.25 mg twice daily.  COPD on 2 L oxygen at baseline Hypoxic respiratory failure -Stable.  Physical exam no wheezing.  O2 sat 96% room air - Continue Anoro Ellipta 1 puff  daily.  Continue Xopenex as needed.  Chronic anemia -Continue oral iron supplement -Continue MiraLAX as needed to prevent constipation.  Generalized anxiety disorder -Continue Zoloft is not 75 mg daily.  Hyperlipidemia - Continue simvastatin 20 mg daily  DVT prophylaxis:  SCDs.  Defer pharmacological prophylaxis in the setting of GI bleeding Code Status:  Full Code.  I have verified with patient at bedside. Diet: Currently n.p.o. for possible EGD or colonoscopy tomorrow Family Communication: No family member at the bedside now Disposition Plan: Pending GI evaluation and further recommendation.  Tentative discharge to nursing home next 2 to 3 days. Consults: Gastroenterology Admission status:   Inpatient, Telemetry bed  Severity of Illness: The appropriate patient status for this patient is INPATIENT. Inpatient status is judged to be reasonable and necessary in order to provide the required intensity of service to ensure the patient's safety. The patient's presenting symptoms, physical exam findings, and initial radiographic and laboratory data in the context of their chronic comorbidities is felt to place them at high risk for further clinical deterioration. Furthermore, it is not anticipated that the patient will be medically stable for discharge from the hospital within 2 midnights of admission.   * I certify that at the point of admission it is my clinical judgment that the patient will require inpatient hospital care spanning beyond 2 midnights from the point of admission due to high intensity of  service, high risk for further deterioration and high frequency of surveillance required.Marland Kitchen    Tereasa Coop, MD [facility]  How to contact the [facility] Attending or Consulting provider 7A - 7P or covering provider during after hours 7P -7A, for this patient.  Check the care team in [facility] and look for a) attending/consulting [facility] provider listed and b) the [facility] team listed Log into  [email or web address] and use [place]'s universal password to access. If you do not have the password, please contact the hospital operator. Locate the [facility] provider you are looking for under [facility] and page to a number that you can be directly reached. If you still have difficulty reaching the provider, please page the [facility] (Director on Call) for the Hospitalists listed on amion for assistance.  03/28/2023, 3:39 AM

## 2023-03-28 NOTE — Hospital Course (Addendum)
Taken from H&P.  [name] is a 82 y.o. female with medical history significant of diverticular bleeding with embolization 2022, COPD on 2 L oxygen at baseline, GERD, hyperlipidemia and recent hospital admission in August 2024 due to upper GI bleed underwent EGD showed gastritis presented to emergency department for evaluation for blood in her stool.   Per chart review of [facility] nurse reported patient has 2 bloody stool and stool color is dark/tarry how were unable to verify with patient at the bedside.   Patient has colonoscopy in 05/2022 which showed blood throughout the colon without source pinpoint and CT negative.  Diffuse diverticulosis noted and 2 sessile serrated polyp removed. EGD in July 2023 showed gastric polyp without any source of melena. EGD in July 2024 showed multiple gastric polyps, duodenal bulb and evidence of gastritis.  On presenting to ED she was found to have a hemoglobin of 10.5, hematocrit 34, Hemoccult positive stool.  Dr. Bosie Clos from [facility] was consulted.  9/27: Vital stable, hemoglobin decreased to 9.7, it was 11.4 a month ago. GI started her on diet, advancing as tolerated.  They will try to manage conservatively for now. No more bleeding while in the hospital.  9/28: Patient remained hemodynamically stable.  Hemoglobin continued to improve and it was 11.2 on the day of discharge.  Tolerating diet well and had a normal bowel movement.  Patient will continue on current home medications and need to have a close follow-up with her providers for further recommendations.

## 2023-03-28 NOTE — ED Notes (Signed)
Patient is resting at this time.

## 2023-03-28 NOTE — Progress Notes (Signed)
No charge progress note.  [name] is a 82 y.o. female with medical history significant of diverticular bleeding with embolization 2022, COPD on 2 L oxygen at baseline, GERD, hyperlipidemia and recent hospital admission in August 2024 due to upper GI bleed underwent EGD showed gastritis presented to emergency department for evaluation for blood in her stool.   Per chart review of [facility] nurse reported patient has 2 bloody stool and stool color is dark/tarry how were unable to verify with patient at the bedside.   Patient has colonoscopy in 05/2022 which showed blood throughout the colon without source pinpoint and CT negative.  Diffuse diverticulosis noted and 2 sessile serrated polyp removed. EGD in July 2023 showed gastric polyp without any source of melena. EGD in July 2024 showed multiple gastric polyps, duodenal bulb and evidence of gastritis.  On presenting to ED she was found to have a hemoglobin of 10.5, hematocrit 34, Hemoccult positive stool.  Dr. Bosie Clos from [facility] was consulted.  9/27: Vital stable, hemoglobin decreased to 9.7, it was 11.4 a month ago. GI started her on diet, advancing as tolerated.  They will try to manage conservatively for now. No more bleeding while in the hospital. Patient has benign exam and was tolerating clear liquid diet when seen today.  We will continue to monitor and if remains stable likely be discharged tomorrow

## 2023-03-28 NOTE — Consult Note (Signed)
Referring Provider: Dr. Janalyn Shy Primary Care Physician:  Octavia Heir, NP Primary Gastroenterologist:  Dr. Ewing Schlein  Reason for Consultation:  GI Bleed  HPI: [name] is a 82 y.o. female with history of diverticular bleeding with embolization in 2022 who was admitted this past July with melena and EGD (7/24) showed gastritis and gastric polyps without any active bleeding. Colonoscopy in 11/23 for a GI bleed where 2 sessile serrated adenomas were removed and showed diffuse diverticulosis. Poor prep due to stool and blood during that colonoscopy. Nursing home reported 2 black tarry stools yesterday per chart review. Patient not able to see her stool color. Denies N/V/abdominal pain/dizziness. Heme positive BROWN stool in ER. Hgb 9.8. Feels ok.  Past Medical History:  Diagnosis Date   Anxiety    Arthritis    "bilateral knees, shoulders, elbows; neck, pretty widespread" (09/05/2017)   BPPV (benign paroxysmal positional vertigo)    Depression    GERD (gastroesophageal reflux disease)    Glaucoma, both eyes    Headache    "probably 2/month" (09/05/2017)   History of blood transfusion ~ 2008   "related to LGIB"   Hyperlipemia    Lower GI bleeding ~ 2008; 09/05/2017   "had to have blood transfusion"   Macular degeneration, bilateral    Osteopenia    Seasonal allergies    Skin cancer, basal cell 2001   "off my nose, left side"   Sleeping excessive    Tinnitus of both ears     Past Surgical History:  Procedure Laterality Date   BALLOON DILATION N/A 05/08/2021   Procedure: BALLOON DILATION;  Surgeon: Kerin Salen, MD;  Location: [facility] ENDOSCOPY;  Service: Gastroenterology;  Laterality: N/A;   BASAL CELL CARCINOMA EXCISION  2001   "off my nose, left side"   BIOPSY  05/08/2021   Procedure: BIOPSY;  Surgeon: Kerin Salen, MD;  Location: [facility] ENDOSCOPY;  Service: Gastroenterology;;   BLEPHAROPLASTY Bilateral    CATARACT EXTRACTION W/ INTRAOCULAR LENS  IMPLANT, BILATERAL Bilateral 1990's   COLONOSCOPY  WITH PROPOFOL N/A 05/04/2022   Procedure: COLONOSCOPY WITH PROPOFOL;  Surgeon: Kerin Salen, MD;  Location: [facility] ENDOSCOPY;  Service: Gastroenterology;  Laterality: N/A;   ESOPHAGOGASTRODUODENOSCOPY (EGD) WITH PROPOFOL N/A 05/08/2021   Procedure: ESOPHAGOGASTRODUODENOSCOPY (EGD) WITH PROPOFOL;  Surgeon: Kerin Salen, MD;  Location: [facility] ENDOSCOPY;  Service: Gastroenterology;  Laterality: N/A;   ESOPHAGOGASTRODUODENOSCOPY (EGD) WITH PROPOFOL N/A 01/11/2022   Procedure: ESOPHAGOGASTRODUODENOSCOPY (EGD) WITH PROPOFOL;  Surgeon: Vida Rigger, MD;  Location: [facility] ENDOSCOPY;  Service: Gastroenterology;  Laterality: N/A;   ESOPHAGOGASTRODUODENOSCOPY (EGD) WITH PROPOFOL N/A 01/06/2023   Procedure: ESOPHAGOGASTRODUODENOSCOPY (EGD) WITH PROPOFOL;  Surgeon: Vida Rigger, MD;  Location: [facility] ENDOSCOPY;  Service: Gastroenterology;  Laterality: N/A;   EYE SURGERY Bilateral    "to improve vision after cataract OR"   IR ANGIOGRAM FOLLOW UP STUDY  12/19/2020   IR ANGIOGRAM SELECTIVE EACH ADDITIONAL VESSEL  12/19/2020   IR ANGIOGRAM SELECTIVE EACH ADDITIONAL VESSEL  12/19/2020   IR ANGIOGRAM VISCERAL SELECTIVE  12/19/2020   IR EMBO ART  VEN HEMORR LYMPH EXTRAV  INC GUIDE ROADMAPPING  12/19/2020   IR US GUIDE VASC ACCESS RIGHT  12/19/2020   JOINT REPLACEMENT     POLYPECTOMY  05/04/2022   Procedure: POLYPECTOMY;  Surgeon: Kerin Salen, MD;  Location: [facility] ENDOSCOPY;  Service: Gastroenterology;;   STAPEDES SURGERY Left    "scraped stapedes because it was sticking when it wasn't suppose to"   TONSILLECTOMY AND ADENOIDECTOMY  1946   TOTAL KNEE ARTHROPLASTY Left ~  2008   TOTAL KNEE ARTHROPLASTY  12/20/2011   Procedure: TOTAL KNEE ARTHROPLASTY;  Surgeon: Verlee Rossetti, MD;  Location: [facility] OR;  Service: Orthopedics;  Laterality: Right;  Right Total Knee Arthroplasty   TUBAL LIGATION  1980's    Prior to Admission medications   Medication Sig Start Date End Date Taking? Authorizing Provider  acetaminophen (TYLENOL) 500 MG tablet Take  500-1,000 mg by mouth in the morning and at bedtime. Take 1 tablet (500 mg) once daily & Take 2 tablets (1000 mg) once daily   Yes [provider]  acetaminophen (TYLENOL) 650 MG CR tablet Take 1,300 mg by mouth at bedtime.   Yes [provider]  albuterol (VENTOLIN HFA) 108 (90 Base) MCG/ACT inhaler Inhale 2 puffs into the lungs every 4 (four) hours as needed for wheezing or shortness of breath.   Yes [provider]  alendronate (FOSAMAX) 70 MG tablet Take 70 mg by mouth every Sunday. Take with a full glass of water on an empty stomach.   Yes [provider]  Ascorbic Acid (VITAMIN C) 1000 MG tablet Take 1,000 mg by mouth every morning.   Yes [provider]  Cholecalciferol (VITAMIN D3) 50 MCG (2000 UT) TABS Take 2,000 Units by mouth every morning.   Yes [provider]  docusate sodium (COLACE) 100 MG capsule Take 200 mg by mouth at bedtime.   Yes [provider]  dorzolamide-timolol (COSOPT) 22.3-6.8 MG/ML ophthalmic solution Place 1 drop into both eyes 2 (two) times daily.   Yes [provider]  ferrous gluconate (FERGON) 240 (27 FE) MG tablet Take 240 mg by mouth every morning. Take without food   Yes [provider]  LIDOCAINE EX Apply 1 application  topically in the morning and at bedtime.   Yes [provider]  METOPROLOL TARTRATE PO Take 6.25 mg by mouth in the morning and at bedtime.   Yes [provider]  Multiple Vitamins-Minerals (PRESERVISION AREDS 2) CAPS Take 1 capsule by mouth 2 (two) times daily.   Yes [provider]  Netarsudil Dimesylate (RHOPRESSA) 0.02 % SOLN Place 1 drop into both eyes at bedtime.   Yes [provider]  oxybutynin (DITROPAN-XL) 5 MG 24 hr tablet Take 5 mg by mouth daily. 11/22/22  Yes [provider]  pantoprazole (PROTONIX) 40 MG tablet Take 40 mg by mouth 2 (two) times daily.   Yes [provider]  polyethylene glycol  (MIRALAX / GLYCOLAX) 17 g packet Take 8.5 g by mouth daily as needed (constipation).   Yes [provider]  Probiotic Product (ALIGN) 4 MG CAPS Take 4 mg by mouth at bedtime.   Yes [provider]  rOPINIRole (REQUIP) 0.5 MG tablet Take 0.5 mg by mouth as needed.   Yes [provider]  sertraline (ZOLOFT) 50 MG tablet Take 1.5 tablets (75 mg total) by mouth daily. 01/07/23  Yes Rhetta Mura, MD  simvastatin (ZOCOR) 20 MG tablet Take 20 mg by mouth every evening.   Yes [provider]  Sodium Fluoride (PREVIDENT 5000 BOOSTER PLUS) 1.1 % PSTE Place 1 Application onto teeth See admin instructions. Brush on teeth with a toothbrush after evening mouth care. Spit out excess and do not rinse.   Yes [provider]  umeclidinium-vilanterol (ANORO ELLIPTA) 62.5-25 MCG/ACT AEPB Inhale 1 puff into the lungs every morning.   Yes [provider]  zinc oxide 20 % ointment Apply 1 Application topically See admin instructions. Apply topically to buttocks  after every incontinent episode and as needed for redness   Yes [provider]  gabapentin (NEURONTIN) 100 MG capsule Take 1 capsule (100 mg total) by mouth at bedtime. Patient not taking: Reported on 03/28/2023 01/07/23   Rhetta Mura, MD    Scheduled Meds:  sodium chloride   Intravenous Once   docusate sodium  200 mg Oral QHS   ferrous gluconate  324 mg Oral q morning   oxybutynin  5 mg Oral Daily   pantoprazole (PROTONIX) IV  40 mg Intravenous Q12H   sertraline  75 mg Oral Daily   simvastatin  20 mg Oral QPM   sodium chloride flush  3 mL Intravenous Q12H   umeclidinium-vilanterol  1 puff Inhalation Daily   Continuous Infusions:  sodium chloride     dextrose 5% lactated ringers 100 mL/hr at 03/28/23 0449   PRN Meds:.sodium chloride, acetaminophen **OR** acetaminophen, levalbuterol, metoprolol tartrate, ondansetron **OR** ondansetron (ZOFRAN) IV, polyethylene glycol, sodium  chloride flush  Allergies as of 03/27/2023 - Reviewed 03/27/2023  Allergen Reaction Noted   Morphine and codeine Itching 11/29/2011   Aspirin Other (See Comments) 10/22/2020   Duloxetine hcl Other (See Comments) 10/22/2020   Tymlos [abaloparatide] Other (See Comments) 10/22/2020   Ventolin [albuterol] Other (See Comments) 12/25/2020   Cefadroxil Hives 12/11/2011    Family History  Problem Relation Age of Onset   Heart attack Mother    Heart disease Father    Heart failure Father    Bladder Cancer Father    Supraventricular tachycardia Sister    Hypertension Sister     Social History   Socioeconomic History   Marital status: Married    Spouse name: Rosanne Ashing    Number of children: 3   Years of education: College   Highest education level: Not on file  Occupational History   Occupation: Retired Runner, broadcasting/film/video  Tobacco Use   Smoking status: Former    Current packs/day: 0.00    Average packs/day: 0.8 packs/day for 37.0 years (27.8 ttl pk-yrs)    Types: Cigarettes    Start date: 07/02/1955    Quit date: 07/01/1992    Years since quitting: 30.7   Smokeless tobacco: Never  Vaping Use   Vaping status: Never Used  Substance and Sexual Activity   Alcohol use: Yes    Alcohol/week: 7.0 standard drinks of alcohol    Types: 7 Glasses of wine per week    Comment: 09/05/2017  "6 oz wine, 5 days/wk"   Drug use: No    Types: Hydrocodone   Sexual activity: Not Currently  Other Topics Concern   Not on file  Social History Narrative   1-2 cups of coffee a day    Social Determinants of Health   Financial Resource Strain: Low Risk  (03/19/2022)   Overall Financial Resource Strain (CARDIA)    Difficulty of Paying Living Expenses: Not hard at all  Food Insecurity: No Food Insecurity (03/28/2023)   Hunger Vital Sign    Worried About Running Out of Food in the Last Year: Never true    Ran Out of Food in the Last Year: Never true  Transportation Needs: No Transportation Needs (03/28/2023)   PRAPARE -  Administrator, Civil Service (Medical): No    Lack of Transportation (Non-Medical): No  Physical Activity: Insufficiently Active (03/19/2022)   Exercise Vital Sign    Days of Exercise per Week: 2 days    Minutes of Exercise per Session: 20 min  Stress: No Stress Concern  Present (03/19/2022)   [employer] of Occupational Health - Occupational Stress Questionnaire    Feeling of Stress : Not at all  Social Connections: Moderately Isolated (03/19/2022)   Social Connection and Isolation Panel [NHANES]    Frequency of Communication with Friends and Family: More than three times a week    Frequency of Social Gatherings with Friends and Family: Twice a week    Attends Religious Services: Never    Database administrator or Organizations: No    Attends Banker Meetings: Never    Marital Status: Married  Catering manager Violence: Not At Risk (03/28/2023)   Humiliation, Afraid, Rape, and Kick questionnaire    Fear of Current or Ex-Partner: No    Emotionally Abused: No    Physically Abused: No    Sexually Abused: No    Review of Systems: All negative except as stated above in HPI.  Physical Exam: Vital signs: Vitals:   03/28/23 0843 03/28/23 1243  BP:  130/64  Pulse:  79  Resp:  18  Temp:  (!) 97.5 F (36.4 C)  SpO2: 97% 96%   Last BM Date : 03/27/23 General:   Lethargic, elderly, well-nourished, pleasant, no acute distress, hard of hearing Head: normocephalic, atraumatic Eyes: anicteric sclera ENT: oropharynx clear Neck: supple, nontender Lungs:  Clear throughout to auscultation.   No wheezes, crackles, or rhonchi. No acute distress. Heart:  Regular rate and rhythm; no murmurs, clicks, rubs,  or gallops. Abdomen: soft, nontender, nondistended, +BS  Rectal:  Deferred Ext: no edema  GI:  Lab Results: Recent Labs    03/27/23 2234 03/28/23 0424 03/28/23 0833 03/28/23 1255  WBC 8.4 5.7  --   --   HGB 10.5* 9.7* 10.1* 9.8*  HCT 34.0* 31.2* 32.0*  31.2*  PLT 166 138*  --   --    BMET Recent Labs    03/27/23 2234 03/28/23 0424  NA 139 142  K 4.1 3.5  CL 105 108  CO2 26 25  GLUCOSE 117* 111*  BUN 17 13  CREATININE 0.59 0.32*  CALCIUM 9.0 8.7*   LFT Recent Labs    03/28/23 0424  PROT 6.1*  ALBUMIN 3.5  AST 15  ALT 11  ALKPHOS 39  BILITOT 0.8   PT/INR Recent Labs    03/27/23 2234  LABPROT 14.6  INR 1.1     Studies/Results: No results found.  Impression/Plan: GI bleed - report of black stools at [facility]. No overt bleeding since admit. Heme positive on admit. EGD in 7/24 and colonoscopy in 11/23 as stated above. Would manage conservatively and hold off on repeat GI procedures. Full liquid diet for dinner and soft diet tomorrow morning. Dr. Lorenso Quarry will f/u tomorrow.    LOS: 0 days   [name]  03/28/2023, 2:12 PM  Questions please call [phone]

## 2023-03-28 NOTE — ED Notes (Signed)
ED TO INPATIENT HANDOFF REPORT  ED Nurse Name and Phone #: Jacqulyn Liner [phone]  S Name/Age/Gender [name] 82 y.o. female Room/Bed: WA03/WA03  Code Status   Code Status: Full Code  Home/SNF/Other Skilled nursing facility Patient oriented to: self, place, time, and situation Is this baseline? Yes   Triage Complete: Triage complete  Chief Complaint GI bleed [K92.2]  Triage Note BIBA from [facility] for blood in stool x 2 days. 128/70 BP 68 HR 95% room air   Allergies Allergies  Allergen Reactions   Morphine And Codeine Itching   Aspirin Other (See Comments)    Unknown reaction   Duloxetine Hcl Other (See Comments)    Unknown reaction   Tymlos [Abaloparatide] Other (See Comments)    Unknown reaction   Ventolin [Albuterol] Other (See Comments)    rapid heart beat   Cefadroxil Hives    Patient can take amoxicillin and cipro    Level of Care/Admitting Diagnosis ED Disposition     ED Disposition  Admit   Condition  --   Comment  Hospital Area: [facility] [[id number]]  Level of Care: Telemetry [5]  Admit to tele based on following criteria: Other see comments  Comments: Monitor for arrhythmia  May admit patient to [facility] or [facility] if equivalent level of care is available:: No  Covid Evaluation: Asymptomatic - no recent exposure (last 10 days) testing not required  Diagnosis: GI bleed [454098]  Admitting Physician: Tereasa Coop [[id number]]  Attending Physician: Tereasa Coop [[id number]]  Certification:: I certify this patient will need inpatient services for at least 2 midnights  Expected Medical Readiness: 04/01/2023          B Medical/Surgery History Past Medical History:  Diagnosis Date   Anxiety    Arthritis    "bilateral knees, shoulders, elbows; neck, pretty widespread" (09/05/2017)   BPPV (benign paroxysmal positional vertigo)    Depression    GERD (gastroesophageal reflux disease)    Glaucoma, both eyes     Headache    "probably 2/month" (09/05/2017)   History of blood transfusion ~ 2008   "related to LGIB"   Hyperlipemia    Lower GI bleeding ~ 2008; 09/05/2017   "had to have blood transfusion"   Macular degeneration, bilateral    Osteopenia    Seasonal allergies    Skin cancer, basal cell 2001   "off my nose, left side"   Sleeping excessive    Tinnitus of both ears    Past Surgical History:  Procedure Laterality Date   BALLOON DILATION N/A 05/08/2021   Procedure: BALLOON DILATION;  Surgeon: Kerin Salen, MD;  Location: [facility] ENDOSCOPY;  Service: Gastroenterology;  Laterality: N/A;   BASAL CELL CARCINOMA EXCISION  2001   "off my nose, left side"   BIOPSY  05/08/2021   Procedure: BIOPSY;  Surgeon: Kerin Salen, MD;  Location: [facility] ENDOSCOPY;  Service: Gastroenterology;;   BLEPHAROPLASTY Bilateral    CATARACT EXTRACTION W/ INTRAOCULAR LENS  IMPLANT, BILATERAL Bilateral 1990's   COLONOSCOPY WITH PROPOFOL N/A 05/04/2022   Procedure: COLONOSCOPY WITH PROPOFOL;  Surgeon: Kerin Salen, MD;  Location: [facility] ENDOSCOPY;  Service: Gastroenterology;  Laterality: N/A;   ESOPHAGOGASTRODUODENOSCOPY (EGD) WITH PROPOFOL N/A 05/08/2021   Procedure: ESOPHAGOGASTRODUODENOSCOPY (EGD) WITH PROPOFOL;  Surgeon: Kerin Salen, MD;  Location: [facility] ENDOSCOPY;  Service: Gastroenterology;  Laterality: N/A;   ESOPHAGOGASTRODUODENOSCOPY (EGD) WITH PROPOFOL N/A 01/11/2022   Procedure: ESOPHAGOGASTRODUODENOSCOPY (EGD) WITH PROPOFOL;  Surgeon: Vida Rigger, MD;  Location: [facility] ENDOSCOPY;  Service:  Gastroenterology;  Laterality: N/A;   ESOPHAGOGASTRODUODENOSCOPY (EGD) WITH PROPOFOL N/A 01/06/2023   Procedure: ESOPHAGOGASTRODUODENOSCOPY (EGD) WITH PROPOFOL;  Surgeon: Vida Rigger, MD;  Location: [facility] ENDOSCOPY;  Service: Gastroenterology;  Laterality: N/A;   EYE SURGERY Bilateral    "to improve vision after cataract OR"   IR ANGIOGRAM FOLLOW UP STUDY  12/19/2020   IR ANGIOGRAM SELECTIVE EACH ADDITIONAL VESSEL  12/19/2020   IR ANGIOGRAM SELECTIVE  EACH ADDITIONAL VESSEL  12/19/2020   IR ANGIOGRAM VISCERAL SELECTIVE  12/19/2020   IR EMBO ART  VEN HEMORR LYMPH EXTRAV  INC GUIDE ROADMAPPING  12/19/2020   IR US GUIDE VASC ACCESS RIGHT  12/19/2020   JOINT REPLACEMENT     POLYPECTOMY  05/04/2022   Procedure: POLYPECTOMY;  Surgeon: Kerin Salen, MD;  Location: [facility] ENDOSCOPY;  Service: Gastroenterology;;   STAPEDES SURGERY Left    "scraped stapedes because it was sticking when it wasn't suppose to"   TONSILLECTOMY AND ADENOIDECTOMY  1946   TOTAL KNEE ARTHROPLASTY Left ~ 2008   TOTAL KNEE ARTHROPLASTY  12/20/2011   Procedure: TOTAL KNEE ARTHROPLASTY;  Surgeon: Verlee Rossetti, MD;  Location: [facility] OR;  Service: Orthopedics;  Laterality: Right;  Right Total Knee Arthroplasty   TUBAL LIGATION  1980's     A IV Location/Drains/Wounds Patient Lines/Drains/Airways Status     Active Line/Drains/Airways     Name Placement date Placement time Site Days   Peripheral IV 03/27/23 20 G 1" Right Antecubital 03/27/23  2232  Antecubital  1            Intake/Output Last 24 hours  Intake/Output Summary (Last 24 hours) at 03/28/2023 0450 Last data filed at 03/28/2023 0400 Gross per 24 hour  Intake 3 ml  Output --  Net 3 ml    Labs/Imaging Results for orders placed or performed during the hospital encounter of 03/27/23 (from the past 48 hour(s))  CBC with Differential     Status: Abnormal   Collection Time: 03/27/23 10:34 PM  Result Value Ref Range   WBC 8.4 4.0 - 10.5 K/uL   RBC 3.39 (L) 3.87 - 5.11 MIL/uL   Hemoglobin 10.5 (L) 12.0 - 15.0 g/dL   HCT 01.0 (L) 27.2 - 53.6 %   MCV 100.3 (H) 80.0 - 100.0 fL   MCH 31.0 26.0 - 34.0 pg   MCHC 30.9 30.0 - 36.0 g/dL   RDW 64.4 03.4 - 74.2 %   Platelets 166 150 - 400 K/uL   nRBC 0.2 0.0 - 0.2 %   Neutrophils Relative % 72 %   Neutro Abs 6.0 1.7 - 7.7 K/uL   Lymphocytes Relative 16 %   Lymphs Abs 1.4 0.7 - 4.0 K/uL   Monocytes Relative 10 %   Monocytes Absolute 0.8 0.1 - 1.0 K/uL   Eosinophils  Relative 2 %   Eosinophils Absolute 0.2 0.0 - 0.5 K/uL   Basophils Relative 0 %   Basophils Absolute 0.0 0.0 - 0.1 K/uL   Immature Granulocytes 0 %   Abs Immature Granulocytes 0.02 0.00 - 0.07 K/uL    Comment: Performed at [facility], [address] [place], [place] [address]  Basic metabolic panel     Status: Abnormal   Collection Time: 03/27/23 10:34 PM  Result Value Ref Range   Sodium 139 135 - 145 mmol/L   Potassium 4.1 3.5 - 5.1 mmol/L   Chloride 105 98 - 111 mmol/L   CO2 26 22 - 32 mmol/L   Glucose, Bld 117 (H) 70 -  99 mg/dL    Comment: Glucose reference range applies only to samples taken after fasting for at least 8 hours.   BUN 17 8 - 23 mg/dL   Creatinine, Ser 9.52 0.44 - 1.00 mg/dL   Calcium 9.0 8.9 - 84.1 mg/dL   GFR, Estimated >32 >44 mL/min    Comment: (NOTE) Calculated using the CKD-EPI Creatinine Equation (2021)    Anion gap 8 5 - 15    Comment: Performed at [facility], [address] [place], [place] [address]  Type and screen [facility]     Status: None   Collection Time: 03/27/23 10:34 PM  Result Value Ref Range   ABO/RH(D) O POS    Antibody Screen NEG    Sample Expiration      03/30/2023,2359 Performed at [facility], [address] [place], [place] [address]   APTT     Status: None   Collection Time: 03/27/23 10:34 PM  Result Value Ref Range   aPTT 27 24 - 36 seconds    Comment: Performed at [facility], [address], [place], [place] [address]  Protime-INR     Status: None   Collection Time: 03/27/23 10:34 PM  Result Value Ref Range   Prothrombin Time 14.6 11.4 - 15.2 seconds   INR 1.1 0.8 - 1.2    Comment: (NOTE) INR goal varies based on device and disease states. Performed at [facility], [address] [place], [place] [address]   POC occult blood, ED     Status: Abnormal   Collection Time: 03/27/23 11:19 PM  Result  Value Ref Range   Fecal Occult Bld POSITIVE (A) NEGATIVE  CBC     Status: Abnormal   Collection Time: 03/28/23  4:24 AM  Result Value Ref Range   WBC 5.7 4.0 - 10.5 K/uL   RBC 3.09 (L) 3.87 - 5.11 MIL/uL   Hemoglobin 9.7 (L) 12.0 - 15.0 g/dL   HCT 56.3 (L) 87.5 - 64.3 %   MCV 101.0 (H) 80.0 - 100.0 fL   MCH 31.4 26.0 - 34.0 pg   MCHC 31.1 30.0 - 36.0 g/dL   RDW 32.9 51.8 - 84.1 %   Platelets 138 (L) 150 - 400 K/uL   nRBC 0.0 0.0 - 0.2 %    Comment: Performed at [facility], [address], [name], [place] [address]   No results found.  Pending Labs Unresulted Labs (From admission, onward)     Start     Ordered   03/28/23 0800  Hemoglobin and hematocrit, blood  3 times daily,   R (with TIMED occurrences)      03/28/23 0323   03/28/23 0500  Comprehensive metabolic panel  Daily,   R      03/28/23 0323   03/28/23 0333  Prepare RBC (crossmatch)  (Blood Administration Adult)  Once,   R       Question Answer Comment  # of Units 1 unit   Transfusion Indications Other   Comments GI bleed   Number of Units to Keep Ahead NO units ahead   If emergent release call blood bank Not emergent release      03/28/23 0332            Vitals/Pain Today's Vitals   03/27/23 2330 03/28/23 0214 03/28/23 0400 03/28/23 0445  BP: 121/63 122/64 117/60 (!) 113/54  Pulse: 78 76 69 68  Resp:  17 18 18  Temp:  97.7 F (36.5 C)    TempSrc:  Oral    SpO2: 93% 93% 94% 94%  Weight:      Height:        Isolation Precautions No active isolations  Medications Medications  simvastatin (ZOCOR) tablet 20 mg (has no administration in time range)  sertraline (ZOLOFT) tablet 75 mg (has no administration in time range)  docusate sodium (COLACE) capsule 200 mg (has no administration in time range)  pantoprazole (PROTONIX) 80 mg /NS 100 mL IVPB (0 mg Intravenous Stopped 03/28/23 0448)  polyethylene glycol (MIRALAX / GLYCOLAX) packet 8.5 g (has no administration in time range)   oxybutynin (DITROPAN-XL) 24 hr tablet 5 mg (has no administration in time range)  ferrous gluconate (FERGON) tablet 240 mg (has no administration in time range)  umeclidinium-vilanterol (ANORO ELLIPTA) 62.5-25 MCG/ACT 1 puff (has no administration in time range)  dextrose 5 % in lactated ringers infusion ( Intravenous New Bag/Given 03/28/23 0449)  sodium chloride flush (NS) 0.9 % injection 3 mL (3 mLs Intravenous Given 03/28/23 0400)  sodium chloride flush (NS) 0.9 % injection 3 mL (has no administration in time range)  0.9 %  sodium chloride infusion (has no administration in time range)  acetaminophen (TYLENOL) tablet 650 mg (has no administration in time range)    Or  acetaminophen (TYLENOL) suppository 650 mg (has no administration in time range)  ondansetron (ZOFRAN) tablet 4 mg (has no administration in time range)    Or  ondansetron (ZOFRAN) injection 4 mg (has no administration in time range)  0.9 %  sodium chloride infusion (Manually program via Guardrails IV Fluids) (has no administration in time range)  metoprolol tartrate (LOPRESSOR) injection 5 mg (has no administration in time range)  levalbuterol (XOPENEX) nebulizer solution 0.63 mg (has no administration in time range)    Mobility walks with device     Focused Assessments Patient came in from [facility] for bloody stool x 3 days. Patient is A&Ox4. Hard of hearing, has hearing aids that are at her residence at this time. Patient states she uses walker at the residence. Patient has no complaints at this time. Normal vitals throughout visit.    R Recommendations: See Admitting Provider Note  Report given to: Donavan Foil, Theola Sequin [phone]   Additional Notes:

## 2023-03-29 DIAGNOSIS — R58 Hemorrhage, not elsewhere classified: Secondary | ICD-10-CM | POA: Diagnosis not present

## 2023-03-29 DIAGNOSIS — K625 Hemorrhage of anus and rectum: Secondary | ICD-10-CM | POA: Diagnosis not present

## 2023-03-29 DIAGNOSIS — Z7401 Bed confinement status: Secondary | ICD-10-CM | POA: Diagnosis not present

## 2023-03-29 DIAGNOSIS — D649 Anemia, unspecified: Secondary | ICD-10-CM | POA: Diagnosis not present

## 2023-03-29 DIAGNOSIS — F411 Generalized anxiety disorder: Secondary | ICD-10-CM | POA: Diagnosis not present

## 2023-03-29 DIAGNOSIS — K922 Gastrointestinal hemorrhage, unspecified: Secondary | ICD-10-CM | POA: Diagnosis not present

## 2023-03-29 DIAGNOSIS — I959 Hypotension, unspecified: Secondary | ICD-10-CM | POA: Diagnosis not present

## 2023-03-29 DIAGNOSIS — Z8719 Personal history of other diseases of the digestive system: Secondary | ICD-10-CM | POA: Diagnosis not present

## 2023-03-29 DIAGNOSIS — K219 Gastro-esophageal reflux disease without esophagitis: Secondary | ICD-10-CM | POA: Diagnosis not present

## 2023-03-29 LAB — COMPREHENSIVE METABOLIC PANEL
ALT: 10 U/L (ref 0–44)
AST: 16 U/L (ref 15–41)
Albumin: 3.8 g/dL (ref 3.5–5.0)
Alkaline Phosphatase: 40 U/L (ref 38–126)
Anion gap: 8 (ref 5–15)
BUN: 6 mg/dL — ABNORMAL LOW (ref 8–23)
CO2: 26 mmol/L (ref 22–32)
Calcium: 9.1 mg/dL (ref 8.9–10.3)
Chloride: 108 mmol/L (ref 98–111)
Creatinine, Ser: 0.37 mg/dL — ABNORMAL LOW (ref 0.44–1.00)
GFR, Estimated: >60 mL/min (ref >60)
Glucose, Bld: 119 mg/dL — ABNORMAL HIGH (ref 70–99)
Potassium: 3.7 mmol/L (ref 3.5–5.1)
Sodium: 142 mmol/L (ref 135–145)
Total Bilirubin: 0.6 mg/dL (ref 0.3–1.2)
Total Protein: 6.7 g/dL (ref 6.5–8.1)

## 2023-03-29 LAB — HEMOGLOBIN AND HEMATOCRIT, BLOOD
HCT: 35.5 % — ABNORMAL LOW (ref 36.0–46.0)
Hemoglobin: 11.2 g/dL — ABNORMAL LOW (ref 12.0–15.0)

## 2023-03-29 NOTE — Progress Notes (Signed)
Pt report called to [facility], Education officer, community . All questions were answered and pt awaits [facility] for transport to facility.

## 2023-03-29 NOTE — Progress Notes (Signed)
[facility] Progress Note  SUBJECTIVE:   Interval history: [name] was seen and evaluated today at bedside. Resting comfortably in bed. Noted having some right sided abdominal discomfort, intermittent, no current pain. Had bowel movement yesterday evening which was brown in report to her. No chest pain or shortness of breath. She tolerated clear liquids well yesterday, she did have difficulty with swallowing breakfast today, though she believes this was secondary to positioning.   Past Medical History:  Diagnosis Date   Anxiety    Arthritis    "bilateral knees, shoulders, elbows; neck, pretty widespread" (09/05/2017)   BPPV (benign paroxysmal positional vertigo)    Depression    GERD (gastroesophageal reflux disease)    Glaucoma, both eyes    Headache    "probably 2/month" (09/05/2017)   History of blood transfusion ~ 2008   "related to LGIB"   Hyperlipemia    Lower GI bleeding ~ 2008; 09/05/2017   "had to have blood transfusion"   Macular degeneration, bilateral    Osteopenia    Seasonal allergies    Skin cancer, basal cell 2001   "off my nose, left side"   Sleeping excessive    Tinnitus of both ears    Past Surgical History:  Procedure Laterality Date   BALLOON DILATION N/A 05/08/2021   Procedure: BALLOON DILATION;  Surgeon: Kerin Salen, MD;  Location: [facility] ENDOSCOPY;  Service: Gastroenterology;  Laterality: N/A;   BASAL CELL CARCINOMA EXCISION  2001   "off my nose, left side"   BIOPSY  05/08/2021   Procedure: BIOPSY;  Surgeon: Kerin Salen, MD;  Location: [facility] ENDOSCOPY;  Service: Gastroenterology;;   BLEPHAROPLASTY Bilateral    CATARACT EXTRACTION W/ INTRAOCULAR LENS  IMPLANT, BILATERAL Bilateral 1990's   COLONOSCOPY WITH PROPOFOL N/A 05/04/2022   Procedure: COLONOSCOPY WITH PROPOFOL;  Surgeon: Kerin Salen, MD;  Location: [facility] ENDOSCOPY;  Service: Gastroenterology;  Laterality: N/A;   ESOPHAGOGASTRODUODENOSCOPY (EGD) WITH PROPOFOL N/A 05/08/2021   Procedure:  ESOPHAGOGASTRODUODENOSCOPY (EGD) WITH PROPOFOL;  Surgeon: Kerin Salen, MD;  Location: [facility] ENDOSCOPY;  Service: Gastroenterology;  Laterality: N/A;   ESOPHAGOGASTRODUODENOSCOPY (EGD) WITH PROPOFOL N/A 01/11/2022   Procedure: ESOPHAGOGASTRODUODENOSCOPY (EGD) WITH PROPOFOL;  Surgeon: Vida Rigger, MD;  Location: [facility] ENDOSCOPY;  Service: Gastroenterology;  Laterality: N/A;   ESOPHAGOGASTRODUODENOSCOPY (EGD) WITH PROPOFOL N/A 01/06/2023   Procedure: ESOPHAGOGASTRODUODENOSCOPY (EGD) WITH PROPOFOL;  Surgeon: Vida Rigger, MD;  Location: [facility] ENDOSCOPY;  Service: Gastroenterology;  Laterality: N/A;   EYE SURGERY Bilateral    "to improve vision after cataract OR"   IR ANGIOGRAM FOLLOW UP STUDY  12/19/2020   IR ANGIOGRAM SELECTIVE EACH ADDITIONAL VESSEL  12/19/2020   IR ANGIOGRAM SELECTIVE EACH ADDITIONAL VESSEL  12/19/2020   IR ANGIOGRAM VISCERAL SELECTIVE  12/19/2020   IR EMBO ART  VEN HEMORR LYMPH EXTRAV  INC GUIDE ROADMAPPING  12/19/2020   IR US GUIDE VASC ACCESS RIGHT  12/19/2020   JOINT REPLACEMENT     POLYPECTOMY  05/04/2022   Procedure: POLYPECTOMY;  Surgeon: Kerin Salen, MD;  Location: [facility] ENDOSCOPY;  Service: Gastroenterology;;   STAPEDES SURGERY Left    "scraped stapedes because it was sticking when it wasn't suppose to"   TONSILLECTOMY AND ADENOIDECTOMY  1946   TOTAL KNEE ARTHROPLASTY Left ~ 2008   TOTAL KNEE ARTHROPLASTY  12/20/2011   Procedure: TOTAL KNEE ARTHROPLASTY;  Surgeon: Verlee Rossetti, MD;  Location: [facility] OR;  Service: Orthopedics;  Laterality: Right;  Right Total Knee Arthroplasty   TUBAL LIGATION  1980's   Current Facility-Administered Medications  Medication Dose Route Frequency Provider Last Rate Last Admin   0.9 %  sodium chloride infusion (Manually program via Guardrails IV Fluids)   Intravenous Once Sundil, Subrina, MD       0.9 %  sodium chloride infusion  250 mL Intravenous PRN Janalyn Shy, Subrina, MD       acetaminophen (TYLENOL) tablet 650 mg  650 mg Oral Q6H PRN Janalyn Shy, Subrina, MD    650 mg at 03/28/23 0912   Or   acetaminophen (TYLENOL) suppository 650 mg  650 mg Rectal Q6H PRN Janalyn Shy, Subrina, MD       docusate sodium (COLACE) capsule 200 mg  200 mg Oral QHS Sundil, Subrina, MD   200 mg at 03/28/23 2118   ferrous gluconate (FERGON) tablet 324 mg  324 mg Oral q morning Sundil, Subrina, MD   324 mg at 03/29/23 0907   levalbuterol (XOPENEX) nebulizer solution 0.63 mg  0.63 mg Nebulization Q6H PRN Janalyn Shy, Subrina, MD       metoprolol tartrate (LOPRESSOR) injection 5 mg  5 mg Intravenous Q8H PRN Janalyn Shy, Subrina, MD       ondansetron [facility]) tablet 4 mg  4 mg Oral Q6H PRN Janalyn Shy, Subrina, MD       Or   ondansetron [facility]) injection 4 mg  4 mg Intravenous Q6H PRN Janalyn Shy, Subrina, MD       oxybutynin (DITROPAN-XL) 24 hr tablet 5 mg  5 mg Oral Daily Sundil, Subrina, MD   5 mg at 03/29/23 1032   pantoprazole (PROTONIX) injection 40 mg  40 mg Intravenous Q12H Arnetha Courser, MD   40 mg at 03/29/23 0906   polyethylene glycol (MIRALAX / GLYCOLAX) packet 8.5 g  8.5 g Oral Daily PRN Janalyn Shy, Subrina, MD       sertraline (ZOLOFT) tablet 75 mg  75 mg Oral Daily Sundil, Subrina, MD   75 mg at 03/29/23 2952   simvastatin (ZOCOR) tablet 20 mg  20 mg Oral QPM Sundil, Subrina, MD   20 mg at 03/28/23 1734   sodium chloride flush (NS) 0.9 % injection 3 mL  3 mL Intravenous Q12H Sundil, Subrina, MD   3 mL at 03/29/23 8413   sodium chloride flush (NS) 0.9 % injection 3 mL  3 mL Intravenous PRN Sundil, Subrina, MD       umeclidinium-vilanterol Ailene Ards ELLIPTA) 62.5-25 MCG/ACT 1 puff  1 puff Inhalation Daily Sundil, Subrina, MD   1 puff at 03/29/23 2440   Allergies as of 03/27/2023 - Reviewed 03/27/2023  Allergen Reaction Noted   Morphine and codeine Itching 11/29/2011   Aspirin Other (See Comments) 10/22/2020   Duloxetine hcl Other (See Comments) 10/22/2020   Tymlos [abaloparatide] Other (See Comments) 10/22/2020   Ventolin [albuterol] Other (See Comments) 12/25/2020   Cefadroxil Hives  12/11/2011   Review of Systems:  Review of Systems  Respiratory:  Negative for shortness of breath.   Cardiovascular:  Negative for chest pain.  Gastrointestinal:  Negative for abdominal pain, nausea and vomiting.    OBJECTIVE:   Temp:  [97.5 F (36.4 C)-98.4 F (36.9 C)] 98.4 F (36.9 C) (09/28 0403) Pulse Rate:  [76-81] 81 (09/28 0403) Resp:  [14-18] 16 (09/28 0403) BP: (122-138)/(57-64) 138/63 (09/28 0403) SpO2:  [93 %-96 %] 93 % (09/28 0606) Last BM Date : 03/28/23 Physical Exam Constitutional:      General: She is not in acute distress.    Appearance: She is not ill-appearing, toxic-appearing or diaphoretic.  Cardiovascular:     Rate and Rhythm: Normal  rate and regular rhythm.  Pulmonary:     Effort: No respiratory distress.     Breath sounds: Normal breath sounds.  Abdominal:     General: Bowel sounds are normal. There is no distension.     Palpations: Abdomen is soft.     Tenderness: There is no abdominal tenderness. There is no guarding.  Skin:    General: Skin is warm and dry.     Coloration: Skin is pale.  Neurological:     Mental Status: She is alert.     Labs: Recent Labs    03/27/23 2234 03/28/23 0424 03/28/23 0833 03/28/23 1255 03/28/23 1812 03/29/23 0843  WBC 8.4 5.7  --   --   --   --   HGB 10.5* 9.7*   < > 9.8* 10.9* 11.2*  HCT 34.0* 31.2*   < > 31.2* 35.0* 35.5*  PLT 166 138*  --   --   --   --    < > = values in this interval not displayed.   BMET Recent Labs    03/27/23 2234 03/28/23 0424  NA 139 142  K 4.1 3.5  CL 105 108  CO2 26 25  GLUCOSE 117* 111*  BUN 17 13  CREATININE 0.59 0.32*  CALCIUM 9.0 8.7*   LFT Recent Labs    03/28/23 0424  PROT 6.1*  ALBUMIN 3.5  AST 15  ALT 11  ALKPHOS 39  BILITOT 0.8   PT/INR Recent Labs    03/27/23 2234  LABPROT 14.6  INR 1.1   Diagnostic imaging: No results found.  IMPRESSION: Blood per rectum, resolved  -Last BM recorded 03/29/23 @ 0000 which was brown/black in color   Pan-colonic diverticular disease Normocytic anemia, stable/improved  Hyperlipidemia  PLAN: -No further signs of GI bleeding, suspect resolved diverticular bleeding in this setting -Ok for diet as tolerated  -Trend H/H, transfuse for Hgb < 7  -Appears stable for discharge from GI standpoint if tolerates diet  -[facility] will sign off and be available for any questions that arise    LOS: 1 day   Liliane Shi, DO [facility]

## 2023-03-29 NOTE — Care Management Obs Status (Signed)
MEDICARE OBSERVATION STATUS NOTIFICATION   Patient Details  Name: [name] MRN: [id number] Date of Birth: 10/29/1940   Medicare Observation Status Notification Given:  Yes    Adrian Prows, RN 03/29/2023, 9:43 AM

## 2023-03-29 NOTE — Discharge Summary (Signed)
Physician Discharge Summary   Patient: [name] MRN: [id number] DOB: March 08, 1941  Admit date:     03/27/2023  Discharge date: 03/29/23  Discharge Physician: Arnetha Courser   PCP: Octavia Heir, NP   Recommendations at discharge:  Please obtain CBC and BMP on follow-up Follow-up with primary care provider Follow-up with gastroenterology  Discharge Diagnoses: Principal Problem:   GI bleed Active Problems:   History of GI diverticular bleed   Hyperlipidemia   COPD (chronic obstructive pulmonary disease) (HCC)   Chronic anemia   GERD (gastroesophageal reflux disease)   GAD (generalized anxiety disorder)   Hospital Course: Taken from H&P.  [name] is a 82 y.o. female with medical history significant of diverticular bleeding with embolization 2022, COPD on 2 L oxygen at baseline, GERD, hyperlipidemia and recent hospital admission in August 2024 due to upper GI bleed underwent EGD showed gastritis presented to emergency department for evaluation for blood in her stool.   Per chart review of [facility] nurse reported patient has 2 bloody stool and stool color is dark/tarry how were unable to verify with patient at the bedside.   Patient has colonoscopy in 05/2022 which showed blood throughout the colon without source pinpoint and CT negative.  Diffuse diverticulosis noted and 2 sessile serrated polyp removed. EGD in July 2023 showed gastric polyp without any source of melena. EGD in July 2024 showed multiple gastric polyps, duodenal bulb and evidence of gastritis.  On presenting to ED she was found to have a hemoglobin of 10.5, hematocrit 34, Hemoccult positive stool.  Dr. Bosie Clos from [facility] was consulted.  9/27: Vital stable, hemoglobin decreased to 9.7, it was 11.4 a month ago. GI started her on diet, advancing as tolerated.  They will try to manage conservatively for now. No more bleeding while in the hospital.  9/28: Patient remained hemodynamically  stable.  Hemoglobin continued to improve and it was 11.2 on the day of discharge.  Tolerating diet well and had a normal bowel movement.  Patient will continue on current home medications and need to have a close follow-up with her providers for further recommendations.     Consultants: Gastroenterology Procedures performed: None Disposition: Assisted living Diet recommendation:  Discharge Diet Orders (From admission, onward)     Start     Ordered   03/29/23 0000  Diet - low sodium heart healthy        03/29/23 1057           Regular diet DISCHARGE MEDICATION: Allergies as of 03/29/2023       Reactions   Morphine And Codeine Itching   Aspirin Other (See Comments)   Unknown reaction   Duloxetine Hcl Other (See Comments)   Unknown reaction   Tymlos [abaloparatide] Other (See Comments)   Unknown reaction   Ventolin [albuterol] Other (See Comments)   rapid heart beat   Cefadroxil Hives   Patient can take amoxicillin and cipro        Medication List     STOP taking these medications    gabapentin 100 MG capsule Commonly known as: NEURONTIN       TAKE these medications    acetaminophen 650 MG CR tablet Commonly known as: TYLENOL Take 1,300 mg by mouth at bedtime. What changed: Another medication with the same name was removed. Continue taking this medication, and follow the directions you see here.   albuterol 108 (90 Base) MCG/ACT inhaler Commonly known as: VENTOLIN HFA Inhale 2 puffs into  the lungs every 4 (four) hours as needed for wheezing or shortness of breath.   alendronate 70 MG tablet Commonly known as: FOSAMAX Take 70 mg by mouth every Sunday. Take with a full glass of water on an empty stomach.   Align 4 MG Caps Take 4 mg by mouth at bedtime.   Anoro Ellipta 62.5-25 MCG/ACT Aepb Generic drug: umeclidinium-vilanterol Inhale 1 puff into the lungs every morning.   docusate sodium 100 MG capsule Commonly known as: COLACE Take 200 mg by  mouth at bedtime.   dorzolamide-timolol 2-0.5 % ophthalmic solution Commonly known as: COSOPT Place 1 drop into both eyes 2 (two) times daily.   ferrous gluconate 240 (27 FE) MG tablet Commonly known as: FERGON Take 240 mg by mouth every morning. Take without food   LIDOCAINE EX Apply 1 application  topically in the morning and at bedtime.   METOPROLOL TARTRATE PO Take 6.25 mg by mouth in the morning and at bedtime.   oxybutynin 5 MG 24 hr tablet Commonly known as: DITROPAN-XL Take 5 mg by mouth daily.   pantoprazole 40 MG tablet Commonly known as: PROTONIX Take 40 mg by mouth 2 (two) times daily.   polyethylene glycol 17 g packet Commonly known as: MIRALAX / GLYCOLAX Take 8.5 g by mouth daily as needed (constipation).   PreserVision AREDS 2 Caps Take 1 capsule by mouth 2 (two) times daily.   PreviDent 5000 Booster Plus 1.1 % Pste Generic drug: Sodium Fluoride Place 1 Application onto teeth See admin instructions. Brush on teeth with a toothbrush after evening mouth care. Spit out excess and do not rinse.   Rhopressa 0.02 % Soln Generic drug: Netarsudil Dimesylate Place 1 drop into both eyes at bedtime.   rOPINIRole 0.5 MG tablet Commonly known as: REQUIP Take 0.5 mg by mouth as needed.   sertraline 50 MG tablet Commonly known as: ZOLOFT Take 1.5 tablets (75 mg total) by mouth daily.   simvastatin 20 MG tablet Commonly known as: ZOCOR Take 20 mg by mouth every evening.   vitamin C 1000 MG tablet Take 1,000 mg by mouth every morning.   Vitamin D3 50 MCG (2000 UT) Tabs Take 2,000 Units by mouth every morning.   zinc oxide 20 % ointment Apply 1 Application topically See admin instructions. Apply topically to buttocks after every incontinent episode and as needed for redness        Follow-up Information     [name], [name], NP. Schedule an appointment as soon as possible for a visit in 1 week(s).   Specialty: Adult Health Nurse Practitioner Contact  information: [address] [place] [address] [phone]         Vida Rigger, MD. Schedule an appointment as soon as possible for a visit.   Specialty: Gastroenterology Contact information: [address] [place] [address] [phone]                Discharge Exam: Ceasar Mons Weights   03/27/23 2225  Weight: 83.9 kg   General.  Frail elderly lady, in no acute distress. Pulmonary.  Lungs clear bilaterally, normal respiratory effort. CV.  Regular rate and rhythm, no JVD, rub or murmur. Abdomen.  Soft, nontender, nondistended, BS positive. CNS.  Alert and oriented .  No focal neurologic deficit. Extremities.  No edema, no cyanosis, pulses intact and symmetrical. Psychiatry.  Judgment and insight appears normal.   Condition at discharge: stable  The results of significant diagnostics from this hospitalization (including  imaging, microbiology, ancillary and laboratory) are listed below for reference.   Imaging Studies: No results found.  Microbiology: Results for orders placed or performed during the hospital encounter of 05/02/22  MRSA Next Gen by PCR, Nasal     Status: None   Collection Time: 05/03/22 10:39 PM   Specimen: Nasal Mucosa; Nasal Swab  Result Value Ref Range Status   MRSA by PCR Next Gen NOT DETECTED NOT DETECTED Final    Comment: (NOTE) The GeneXpert MRSA Assay (FDA approved for NASAL specimens only), is one component of a comprehensive MRSA colonization surveillance program. It is not intended to diagnose MRSA infection nor to guide or monitor treatment for MRSA infections. Test performance is not FDA approved in patients less than 55 years old. Performed at [facility], [address] [place], [place] [address]     Labs: CBC: Recent Labs  Lab 03/27/23 2234 03/28/23 0424 03/28/23 0833 03/28/23 1255 03/28/23 1812 03/29/23 0843  WBC 8.4 5.7  --   --   --   --   NEUTROABS 6.0  --   --   --   --    --   HGB 10.5* 9.7* 10.1* 9.8* 10.9* 11.2*  HCT 34.0* 31.2* 32.0* 31.2* 35.0* 35.5*  MCV 100.3* 101.0*  --   --   --   --   PLT 166 138*  --   --   --   --    Basic Metabolic Panel: Recent Labs  Lab 03/27/23 2234 03/28/23 0424  NA 139 142  K 4.1 3.5  CL 105 108  CO2 26 25  GLUCOSE 117* 111*  BUN 17 13  CREATININE 0.59 0.32*  CALCIUM 9.0 8.7*   Liver Function Tests: Recent Labs  Lab 03/28/23 0424  AST 15  ALT 11  ALKPHOS 39  BILITOT 0.8  PROT 6.1*  ALBUMIN 3.5   CBG: No results for input(s): "GLUCAP" in the last 168 hours.  Discharge time spent: greater than 30 minutes.  This record has been created using Conservation officer, historic buildings. Errors have been sought and corrected,but may not always be located. Such creation errors do not reflect on the standard of care.   Signed: Arnetha Courser, MD [facility] 03/29/2023

## 2023-03-29 NOTE — TOC Initial Note (Addendum)
Transition of Care [facility]) - Initial/Assessment Note    Patient Details  Name: [name] MRN: [id number] Date of Birth: January 02, 1941  Transition of Care [facility]) CM/SW Contact:    Betty Prows, RN Phone Number: 03/29/2023, 9:52 AM  Clinical Narrative:                 [facility] consult for d/c planning; spoke w/ pt in room; pt says she lives at [facility]; she plans to return at d/c; pt identified POC [name] (spouse) [phone]; pt says she will likely need transportation home; she denies SDOH risks; pt says she has glasses, and walker; she also has grab bars in shower; pt says she has HHPT/OT thru the facility; she also uses 2L oxygen at night which is also supplied by facility; LVMs for Betty W., and [name], admissions to verify pt level of care; awaiting return phone calls.  -1105- Spoke w/ [name]. RN at [facility]; she says pt is from assisted living and can return at d/c; she verifies pt receives PT/OT at facility, and she has home oxygen at night; she assigned pt to: Assisted Living RM # [id number], call report # [phone], ext [phone]; [name] also said d/c summary needed  -1110- notified [name] at facility that pt has d/c orders;  she says pt needs transport by ambulance,pt notified and agrees to d/c plan; pt will call her husband; PTAR called at [phone]; spoke w/ Operator # [id number]; no TOC needs.  Expected Discharge Plan: Home w Home Health Services (PT/OT at [facility]) Barriers to Discharge: Continued Medical Work up   Patient Goals and CMS Choice Patient states their goals for this hospitalization and ongoing recovery are:: back to [facility]          Expected Discharge Plan and Services   Discharge Planning Services: CM Consult Post Acute Care Choice: Resumption of Svcs/PTA Provider Living arrangements for the past 2 months: Assisted Living Facility                                      Prior Living Arrangements/Services Living arrangements for the past  2 months: Assisted Living Facility Lives with:: Facility Resident Patient language and need for interpreter reviewed:: Yes Do you feel safe going back to the place where you live?: Yes      Need for Family Participation in Patient Care: Yes (Comment) Care giver support system in place?: Yes (comment) Current home services: DME, Home OT, Home PT (walker, 2 L oxygen at night) Criminal Activity/Legal Involvement Pertinent to Current Situation/Hospitalization: No - Comment as needed  Activities of Daily Living   ADL Screening (condition at time of admission) Does the patient have a NEW difficulty with bathing/dressing/toileting/self-feeding that is expected to last >3 days?: No Does the patient have a NEW difficulty with getting in/out of bed, walking, or climbing stairs that is expected to last >3 days?: No Does the patient have a NEW difficulty with communication that is expected to last >3 days?: No Is the patient deaf or have difficulty hearing?: Yes Does the patient have difficulty seeing, even when wearing glasses/contacts?: No Does the patient have difficulty concentrating, remembering, or making decisions?: No  Permission Sought/Granted Permission sought to share information with : Case Manager Permission granted to share information with : Yes, Verbal Permission Granted        Permission granted to share info w Relationship:  [name] (spouse) [phone]     Emotional Assessment Appearance:: Appears stated age Attitude/Demeanor/Rapport: Gracious Affect (typically observed): Accepting Orientation: : Oriented to Self, Oriented to Place, Oriented to  Time, Oriented to Situation Alcohol / Substance Use: Not Applicable Psych Involvement: No (comment)  Admission diagnosis:  GI bleed [K92.2] Acute GI bleeding [K92.2] Patient Active Problem List   Diagnosis Date Noted   History of GI diverticular bleed 03/28/2023   GAD (generalized anxiety disorder) 03/28/2023   Diastolic  heart failure (HCC) 09/81/1914   Class 1 obesity with body mass index (BMI) of 34.0 to 34.9 in adult 03/19/2023   Thoracic aorta atherosclerosis (HCC) 03/19/2023   Melena 01/05/2023   Hypotension 05/02/2022   UTI (urinary tract infection) 02/05/2022   Restless leg syndrome 02/05/2022   Senile osteoporosis 02/05/2022   Hypertension 02/05/2022   Pulmonary nodule 1 cm or greater in diameter 02/05/2022   GERD (gastroesophageal reflux disease) 02/05/2022   Upper GI bleed 01/07/2022   Iron deficiency anemia 05/02/2021   Chronic anemia    DOE (dyspnea on exertion) 11/18/2017   COPD (chronic obstructive pulmonary disease) (HCC) 11/18/2017   Diverticulosis of intestine with bleeding 09/05/2017   Acute respiratory failure with hypoxia (HCC) 08/07/2017   Depression with anxiety 08/07/2017   Hyperlipidemia 08/07/2017   Macular degeneration 08/07/2017   Glaucoma 08/07/2017   Weakness 08/07/2017   Dehydration 08/07/2017   Lower GI bleeding 10/14/2016   Diverticulosis of colon 10/14/2016   Anxiety 10/14/2016   Lower GI bleed 10/14/2016   GI bleed    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 [address] [place] [address] Phone: [phone] Fax: [phone]  [facility] - [place], [place] - [address] [place] [address] Phone: [phone] Fax: [phone]     Social Determinants of Health (SDOH) Social History: SDOH Screenings   Food Insecurity: No Food Insecurity (03/28/2023)  Housing: Low Risk  (03/28/2023)  Transportation Needs: No Transportation Needs (03/28/2023)  Utilities: Not At Risk (03/28/2023)  Alcohol Screen: Low Risk  (03/19/2022)  Depression (PHQ2-9): Low Risk   (01/22/2023)  Recent Concern: Depression (PHQ2-9) - Medium Risk (12/20/2022)  Financial Resource Strain: Low Risk  (03/19/2022)  Physical Activity: Insufficiently Active (03/19/2022)  Social Connections: Moderately Isolated (03/19/2022)  Stress: No Stress Concern Present (03/19/2022)  Tobacco Use: Medium Risk (03/28/2023)   SDOH Interventions:     Readmission Risk Interventions    03/29/2023    9:45 AM 01/07/2023    1:24 PM 05/10/2021   11:32 AM  Readmission Risk Prevention Plan  Transportation Screening Complete Complete Complete  PCP or Specialist Appt within 5-7 Days Complete Complete   PCP or Specialist Appt within 3-5 Days   Complete  Home Care Screening Complete Complete   Medication Review (RN CM) Complete Complete   HRI or Home Care Consult   Complete  Social Work Consult for Recovery Care Planning/Counseling   Patient refused  Palliative Care Screening   Not Applicable  Medication Review Oceanographer)   Complete

## 2023-03-29 NOTE — Plan of Care (Signed)

## 2023-03-29 NOTE — Care Management CC44 (Signed)
Condition Code 44 Documentation Completed  Patient Details  Name: MIEKO KNEEBONE MRN: [id number] Date of Birth: 1941-04-22   Condition Code 44 given:  Yes Patient signature on Condition Code 44 notice:  Yes Documentation of 2 MD's agreement:  Yes Code 44 added to claim:  Yes    Adrian Prows, RN 03/29/2023, 9:43 AM

## 2023-03-30 LAB — BPAM RBC
Blood Product Expiration Date: 202410252359
Blood Product Expiration Date: 202410252359
Unit Type and Rh: 5100
Unit Type and Rh: 5100

## 2023-03-30 LAB — TYPE AND SCREEN
ABO/RH(D): O POS
Antibody Screen: NEGATIVE
Unit division: 0
Unit division: 0

## 2023-03-31 ENCOUNTER — Non-Acute Institutional Stay: Payer: Medicare HMO | Admitting: Orthopedic Surgery

## 2023-03-31 ENCOUNTER — Encounter: Payer: Self-pay | Admitting: Orthopedic Surgery

## 2023-03-31 DIAGNOSIS — I10 Essential (primary) hypertension: Secondary | ICD-10-CM | POA: Diagnosis not present

## 2023-03-31 DIAGNOSIS — K922 Gastrointestinal hemorrhage, unspecified: Secondary | ICD-10-CM

## 2023-03-31 DIAGNOSIS — D500 Iron deficiency anemia secondary to blood loss (chronic): Secondary | ICD-10-CM

## 2023-03-31 DIAGNOSIS — J410 Simple chronic bronchitis: Secondary | ICD-10-CM | POA: Diagnosis not present

## 2023-03-31 DIAGNOSIS — R41841 Cognitive communication deficit: Secondary | ICD-10-CM | POA: Diagnosis not present

## 2023-03-31 DIAGNOSIS — F339 Major depressive disorder, recurrent, unspecified: Secondary | ICD-10-CM

## 2023-03-31 DIAGNOSIS — R1312 Dysphagia, oropharyngeal phase: Secondary | ICD-10-CM | POA: Diagnosis not present

## 2023-03-31 NOTE — Progress Notes (Signed)
Location:   [facility]  Nursing Home Room Number: 20-A Place of Service:  ALF [phone]) Provider:  Hazle Nordmann, NP    Patient Care Team: Betty Heir, NP as PCP - General (Adult Health Nurse Practitioner) [name], MD as Consulting Physician (Internal Medicine)  Extended Emergency Contact Information Primary Emergency Contact: Reagle,[name] Address: [address]          [place], [place] [address] [name] Home Phone: [phone] Mobile Phone: [phone] Relation: Spouse Secondary Emergency Contact: Dakiyah, Heinke Mobile Phone: [phone] Relation: Son Interpreter needed? No  Code Status:  FULL CODE Goals of care: Advanced Directive information    03/31/2023    9:39 AM  Advanced Directives  Does Patient Have a Medical Advance Directive? No  Would patient like information on creating a medical advance directive? No - Patient declined     Chief Complaint  Patient presents with   Hospitalization Follow-up    HPI:  Pt is a 82 y.o. female seen today s/p hospitalization 09/26-09/28 due to acute GI bleed.   She currently resides on the assisted living unit at [facility]. PMH: HTN, COPD, pulmonary nodule, Diverticulosis with GI bleed, GERD, OA, osteoporosis,macular degeneration, iron def anemia, unstable gait, anxiety, and depression.   H/o diverticular bleed 11/2020 s/p embolization with recurrence. Hospitalized 05/2022 and 07/07 due to bloody stools. Colonoscopy 2023 noted blood in colon and diffuse diverticulosis. Past EGD noted gastric polyps, duodenal bulb and gastritis. 09/23 she reported 2 episodes of bloody stools. Cbc/diff revealed hgb was 9.7. 09/26 she had another episode and on call provider recommended ED evaluation.   ED evaluation noted hgb 10.5, hct 34, hemoccult positive. GI consulted and recommended conservative management. She was placed on full liquid diet for brief period. She did not have any more episodes of bloody  stools. She was not given blood transfusion. She was able to tolerate regular diet prior to discharge. Discharge hgb 11.2, hct 35.5.   Today, she is upset she went to hospital. No other episodes of blood stools since 09/26. She denies abdominal pain, N/V, constipation or diarrhea. Tolerating regular foods. Afebrile. Vitals stable.    Past Medical History:  Diagnosis Date   Anxiety    Arthritis    "bilateral knees, shoulders, elbows; neck, pretty widespread" (09/05/2017)   BPPV (benign paroxysmal positional vertigo)    Depression    GERD (gastroesophageal reflux disease)    Glaucoma, both eyes    Headache    "probably 2/month" (09/05/2017)   History of blood transfusion ~ 2008   "related to LGIB"   Hyperlipemia    Lower GI bleeding ~ 2008; 09/05/2017   "had to have blood transfusion"   Macular degeneration, bilateral    Osteopenia    Seasonal allergies    Skin cancer, basal cell 2001   "off my nose, left side"   Sleeping excessive    Tinnitus of both ears    Past Surgical History:  Procedure Laterality Date   BALLOON DILATION N/A 05/08/2021   Procedure: BALLOON DILATION;  Surgeon: Kerin Salen, MD;  Location[facility] ENDOSCOPY;  Service: Gastroenterology;  Laterality: N/A;   BASAL CELL CARCINOMA EXCISION  2001   "off my nose, left side"   BIOPSY  05/08/2021   Procedure: BIOPSY;  Surgeon: Kerin Salen, MD;  Location: [facility] ENDOSCOPY;  Service: Gastroenterology;;   BLEPHAROPLASTY Bilateral    CATARACT EXTRACTION W/ INTRAOCULAR LENS  IMPLANT, BILATERAL Bilateral 1990's   COLONOSCOPY WITH PROPOFOL N/A 05/04/2022  Procedure: COLONOSCOPY WITH PROPOFOL;  Surgeon: Kerin Salen, MD;  Location: [facility] ENDOSCOPY;  Service: Gastroenterology;  Laterality: N/A;   ESOPHAGOGASTRODUODENOSCOPY (EGD) WITH PROPOFOL N/A 05/08/2021   Procedure: ESOPHAGOGASTRODUODENOSCOPY (EGD) WITH PROPOFOL;  Surgeon: Kerin Salen, MD;  Location: [facility] ENDOSCOPY;  Service: Gastroenterology;  Laterality: N/A;   ESOPHAGOGASTRODUODENOSCOPY  (EGD) WITH PROPOFOL N/A 01/11/2022   Procedure: ESOPHAGOGASTRODUODENOSCOPY (EGD) WITH PROPOFOL;  Surgeon: Vida Rigger, MD;  Location: [facility] ENDOSCOPY;  Service: Gastroenterology;  Laterality: N/A;   ESOPHAGOGASTRODUODENOSCOPY (EGD) WITH PROPOFOL N/A 01/06/2023   Procedure: ESOPHAGOGASTRODUODENOSCOPY (EGD) WITH PROPOFOL;  Surgeon: Vida Rigger, MD;  Location: [facility] ENDOSCOPY;  Service: Gastroenterology;  Laterality: N/A;   EYE SURGERY Bilateral    "to improve vision after cataract OR"   IR ANGIOGRAM FOLLOW UP STUDY  12/19/2020   IR ANGIOGRAM SELECTIVE EACH ADDITIONAL VESSEL  12/19/2020   IR ANGIOGRAM SELECTIVE EACH ADDITIONAL VESSEL  12/19/2020   IR ANGIOGRAM VISCERAL SELECTIVE  12/19/2020   IR EMBO ART  VEN HEMORR LYMPH EXTRAV  INC GUIDE ROADMAPPING  12/19/2020   IR US GUIDE VASC ACCESS RIGHT  12/19/2020   JOINT REPLACEMENT     POLYPECTOMY  05/04/2022   Procedure: POLYPECTOMY;  Surgeon: Kerin Salen, MD;  Location: [facility] ENDOSCOPY;  Service: Gastroenterology;;   STAPEDES SURGERY Left    "scraped stapedes because it was sticking when it wasn't suppose to"   TONSILLECTOMY AND ADENOIDECTOMY  1946   TOTAL KNEE ARTHROPLASTY Left ~ 2008   TOTAL KNEE ARTHROPLASTY  12/20/2011   Procedure: TOTAL KNEE ARTHROPLASTY;  Surgeon: Verlee Rossetti, MD;  Location: [facility] OR;  Service: Orthopedics;  Laterality: Right;  Right Total Knee Arthroplasty   TUBAL LIGATION  1980's    Allergies  Allergen Reactions   Morphine And Codeine Itching   Aspirin Other (See Comments)    Unknown reaction   Duloxetine Hcl Other (See Comments)    Unknown reaction   Tymlos [Abaloparatide] Other (See Comments)    Unknown reaction   Ventolin [Albuterol] Other (See Comments)    rapid heart beat   Cefadroxil Hives    Patient can take amoxicillin and cipro    Allergies as of 03/31/2023       Reactions   Morphine And Codeine Itching   Aspirin Other (See Comments)   Unknown reaction   Duloxetine Hcl Other (See Comments)   Unknown reaction    Tymlos [abaloparatide] Other (See Comments)   Unknown reaction   Ventolin [albuterol] Other (See Comments)   rapid heart beat   Cefadroxil Hives   Patient can take amoxicillin and cipro        Medication List        Accurate as of March 31, 2023  9:39 AM. If you have any questions, ask your nurse or doctor.          acetaminophen 650 MG CR tablet Commonly known as: TYLENOL Take 1,300 mg by mouth at bedtime.   albuterol 108 (90 Base) MCG/ACT inhaler Commonly known as: VENTOLIN HFA Inhale 2 puffs into the lungs every 4 (four) hours as needed for wheezing or shortness of breath.   alendronate 70 MG tablet Commonly known as: FOSAMAX Take 70 mg by mouth every Sunday. Take with a full glass of water on an empty stomach.   Align 4 MG Caps Take 4 mg by mouth at bedtime.   Anoro Ellipta 62.5-25 MCG/ACT Aepb Generic drug: umeclidinium-vilanterol Inhale 1 puff into the lungs every morning.   docusate sodium 100 MG capsule Commonly known as: COLACE  Take 200 mg by mouth at bedtime.   dorzolamide-timolol 2-0.5 % ophthalmic solution Commonly known as: COSOPT Place 1 drop into both eyes 2 (two) times daily.   ferrous gluconate 240 (27 FE) MG tablet Commonly known as: FERGON Take 240 mg by mouth every morning. Take without food   latanoprost 0.005 % ophthalmic solution Commonly known as: XALATAN Place 1 drop into both eyes at bedtime.   LIDOCAINE EX Apply 1 application  topically in the morning and at bedtime.   LIDOCAINE EX Apply 1 Application topically daily. Right Ankle.   METOPROLOL TARTRATE PO Take 6.25 mg by mouth in the morning and at bedtime.   oxybutynin 5 MG 24 hr tablet Commonly known as: DITROPAN-XL Take 5 mg by mouth daily.   pantoprazole 40 MG tablet Commonly known as: PROTONIX Take 40 mg by mouth 2 (two) times daily.   polyethylene glycol 17 g packet Commonly known as: MIRALAX / GLYCOLAX Take 8.5 g by mouth daily as needed (constipation).    POTASSIUM CHLORIDE PO Take 40 mEq by mouth daily.   PreserVision AREDS 2 Caps Take 1 capsule by mouth 2 (two) times daily.   PreviDent 5000 Booster Plus 1.1 % Pste Generic drug: Sodium Fluoride Place 1 Application onto teeth See admin instructions. Brush on teeth with a toothbrush after evening mouth care. Spit out excess and do not rinse.   Rhopressa 0.02 % Soln Generic drug: Netarsudil Dimesylate Place 1 drop into both eyes at bedtime.   rOPINIRole 0.5 MG tablet Commonly known as: REQUIP Take 0.5 mg by mouth as needed.   sertraline 50 MG tablet Commonly known as: ZOLOFT Take 1.5 tablets (75 mg total) by mouth daily.   simvastatin 20 MG tablet Commonly known as: ZOCOR Take 20 mg by mouth every evening.   vitamin C 1000 MG tablet Take 1,000 mg by mouth every morning.   Vitamin D3 50 MCG (2000 UT) Tabs Take 2,000 Units by mouth every morning.   zinc oxide 20 % ointment Apply 1 Application topically See admin instructions. Apply topically to buttocks after every incontinent episode and as needed for redness        Review of Systems  Constitutional:  Positive for fatigue. Negative for activity change and appetite change.  HENT:  Negative for sore throat and trouble swallowing.   Eyes:  Negative for pain.  Respiratory:  Negative for cough, shortness of breath and wheezing.   Cardiovascular:  Negative for chest pain and leg swelling.  Gastrointestinal:  Positive for blood in stool. Negative for abdominal distention, abdominal pain, constipation, diarrhea, nausea and vomiting.  Genitourinary:  Negative for dysuria and hematuria.  Musculoskeletal:  Positive for arthralgias and gait problem.  Skin:  Negative for wound.  Neurological:  Positive for weakness. Negative for dizziness and headaches.  Psychiatric/Behavioral:  Positive for dysphoric mood. Negative for confusion and sleep disturbance. The patient is not nervous/anxious.     Immunization History  Administered  Date(s) Administered   Fluad Quad(high Dose 65+) 04/24/2022   Influenza, High Dose Seasonal PF 03/14/2018   PFIZER(Purple Top)SARS-COV-2 Vaccination 07/25/2019, 08/16/2019, 12/12/2021   Pneumococcal-Unspecified 03/02/2011   Tdap 03/12/2018   Zoster Recombinant(Shingrix) 10/08/2017, 12/09/2017   Pertinent  Health Maintenance Due  Topic Date Due   DEXA SCAN  Never done   INFLUENZA VACCINE  01/30/2023      05/07/2022   12:25 PM 05/17/2022   10:19 AM 07/12/2022   11:08 AM 10/30/2022   12:44 PM 01/22/2023    1:15 PM  Fall Risk  Falls in the past year?  1 0 0 0  Was there an injury with Fall?  1 0 0 0  Fall Risk Category Calculator  2 0 0 0  Fall Risk Category (Retired)  Moderate Low    (RETIRED) Patient Fall Risk Level Moderate fall risk Moderate fall risk Low fall risk    Patient at Risk for Falls Due to  History of fall(s) History of fall(s) History of fall(s);Impaired balance/gait;Impaired mobility History of fall(s);Impaired balance/gait  Fall risk Follow up  Falls evaluation completed Falls evaluation completed Falls evaluation completed;Education provided;Falls prevention discussed Falls evaluation completed;Education provided;Falls prevention discussed   Functional Status Survey:    Vitals:   03/31/23 0912  BP: 107/63  Pulse: 74  Resp: 18  Temp: 97.7 F (36.5 C)  SpO2: 94%  Weight: 184 lb 3.2 oz (83.6 kg)  Height: 5\' 1"  (1.549 m)   Body mass index is 34.8 kg/m. Physical Exam Vitals reviewed.  Constitutional:      General: She is not in acute distress.    Appearance: She is obese.     Comments: Pale complexion  HENT:     Head: Normocephalic.  Eyes:     General:        Right eye: No discharge.        Left eye: No discharge.  Cardiovascular:     Rate and Rhythm: Normal rate and regular rhythm.     Pulses: Normal pulses.     Heart sounds: Normal heart sounds.  Pulmonary:     Effort: Pulmonary effort is normal. No respiratory distress.     Breath sounds: Normal  breath sounds. No wheezing.  Abdominal:     General: Bowel sounds are normal. There is no distension.     Palpations: Abdomen is soft. There is no mass.     Tenderness: There is no abdominal tenderness. There is no guarding or rebound.     Hernia: No hernia is present.  Musculoskeletal:     Cervical back: Neck supple.     Right lower leg: No edema.     Left lower leg: No edema.  Skin:    General: Skin is warm.     Capillary Refill: Capillary refill takes less than 2 seconds.  Neurological:     General: No focal deficit present.     Mental Status: She is alert and oriented to person, place, and time.     Motor: Weakness present.     Gait: Gait abnormal.     Comments: wheelchair  Psychiatric:        Mood and Affect: Mood normal.     Labs reviewed: Recent Labs    05/05/22 0008 05/05/22 0631 03/27/23 2234 03/28/23 0424 03/29/23 0843  NA  --    < > 139 142 142  K  --    < > 4.1 3.5 3.7  CL  --    < > 105 108 108  CO2  --    < > 26 25 26   GLUCOSE  --    < > 117* 111* 119*  BUN  --    < > 17 13 6*  CREATININE  --    < > 0.59 0.32* 0.37*  CALCIUM  --    < > 9.0 8.7* 9.1  MG 1.9  --   --   --   --    < > = values in this interval not displayed.   Recent Labs  01/07/23 0506 01/14/23 0000 03/28/23 0424 03/29/23 0843  AST 15 12* 15 16  ALT 10 8 11 10   ALKPHOS 31* 40 39 40  BILITOT 0.5  --  0.8 0.6  PROT 5.8*  --  6.1* 6.7  ALBUMIN 3.3* 3.7 3.5 3.8   Recent Labs    01/07/23 0506 01/14/23 0000 02/07/23 0000 03/27/23 2234 03/28/23 0424 03/28/23 0833 03/28/23 1255 03/28/23 1812 03/29/23 0843  WBC 6.0 5.9 6.4 8.4 5.7  --   --   --   --   NEUTROABS 3.3 3,133.00 3,859.00 6.0  --   --   --   --   --   HGB 8.9* 9.1* 11.4* 10.5* 9.7*   < > 9.8* 10.9* 11.2*  HCT 28.6* 28* 35* 34.0* 31.2*   < > 31.2* 35.0* 35.5*  MCV 100.7*  --   --  100.3* 101.0*  --   --   --   --   PLT 137* 198 186 166 138*  --   --   --   --    < > = values in this interval not displayed.    Lab Results  Component Value Date   TSH 2.47 10/08/2022   No results found for: "HGBA1C" Lab Results  Component Value Date   CHOL 147 02/18/2022   HDL 51 02/18/2022   LDLCALC 72 02/18/2022   TRIG 162 (A) 02/18/2022    Significant Diagnostic Results in last 30 days:  No results found.  Assessment/Plan 1. Acute GI bleeding - bloody stools noted 09/23 and 09/26 - hospitalized 09/26-09/28 - admission hgb 10.5> was 11.2 at discharge - GI recommended conservative management - exam unremarkable - cbc/diff, cmp 10/03  2. Iron deficiency anemia due to chronic blood loss - see above - con ferrous gluconate  3. Primary hypertension - controlled - BUN/creat 6/0.37 03/29/2023 - cont metoprolol  4. Simple chronic bronchitis (HCC) - no exacerbations - cont albuterol prn, anoro ellipta  5. Recurrent depression (HCC) - no mood changes - Na+ 142 - cont Zoloft    Family/ staff Communication: plan discussed with patient and nurse  Labs/tests ordered: cbc/diff, cmp 10/03

## 2023-04-01 ENCOUNTER — Other Ambulatory Visit (HOSPITAL_COMMUNITY): Payer: Self-pay

## 2023-04-02 DIAGNOSIS — R2681 Unsteadiness on feet: Secondary | ICD-10-CM | POA: Diagnosis not present

## 2023-04-02 DIAGNOSIS — M6281 Muscle weakness (generalized): Secondary | ICD-10-CM | POA: Diagnosis not present

## 2023-04-02 DIAGNOSIS — Z9181 History of falling: Secondary | ICD-10-CM | POA: Diagnosis not present

## 2023-04-03 DIAGNOSIS — R41841 Cognitive communication deficit: Secondary | ICD-10-CM | POA: Diagnosis not present

## 2023-04-03 DIAGNOSIS — R1312 Dysphagia, oropharyngeal phase: Secondary | ICD-10-CM | POA: Diagnosis not present

## 2023-04-03 DIAGNOSIS — D649 Anemia, unspecified: Secondary | ICD-10-CM | POA: Diagnosis not present

## 2023-04-03 LAB — BASIC METABOLIC PANEL
BUN: 8 (ref 4–21)
CO2: 28 — AB (ref 13–22)
Chloride: 104 (ref 99–108)
Creatinine: 0.6 (ref 0.5–1.1)
Glucose: 78
Potassium: 4.5 meq/L (ref 3.5–5.1)
Sodium: 140 (ref 137–147)

## 2023-04-03 LAB — CBC AND DIFFERENTIAL
HCT: 34 — AB (ref 36–46)
Hemoglobin: 10.8 — AB (ref 12.0–16.0)
Neutrophils Absolute: 33335
Platelets: 195 10*3/uL (ref 150–400)
WBC: 5.7

## 2023-04-03 LAB — HEPATIC FUNCTION PANEL
ALT: 9 U/L (ref 7–35)
AST: 14 (ref 13–35)
Alkaline Phosphatase: 48 (ref 25–125)
Bilirubin, Total: 0.3

## 2023-04-03 LAB — CBC: RBC: 3.46 — AB (ref 3.87–5.11)

## 2023-04-03 LAB — COMPREHENSIVE METABOLIC PANEL
Albumin: 4.2 (ref 3.5–5.0)
Calcium: 9 (ref 8.7–10.7)
Globulin: 2.4
eGFR: 89

## 2023-04-04 ENCOUNTER — Non-Acute Institutional Stay (INDEPENDENT_AMBULATORY_CARE_PROVIDER_SITE_OTHER): Payer: Self-pay | Admitting: Orthopedic Surgery

## 2023-04-04 ENCOUNTER — Encounter: Payer: Self-pay | Admitting: Orthopedic Surgery

## 2023-04-04 DIAGNOSIS — M810 Age-related osteoporosis without current pathological fracture: Secondary | ICD-10-CM

## 2023-04-04 DIAGNOSIS — Z0000 Encounter for general adult medical examination without abnormal findings: Secondary | ICD-10-CM | POA: Diagnosis not present

## 2023-04-04 DIAGNOSIS — E2839 Other primary ovarian failure: Secondary | ICD-10-CM

## 2023-04-04 NOTE — Progress Notes (Signed)
Subjective:   [name] is a 82 y.o. female who presents for Medicare Annual (Subsequent) preventive examination.  Visit Complete: In person  Patient Medicare AWV questionnaire was completed by the patient on 04/04/2023; I have confirmed that all information answered by patient is correct and no changes since this date.  Cardiac Risk Factors include: advanced age (>64men, >79 women);sedentary lifestyle;obesity (BMI >30kg/m2);hypertension;dyslipidemia     Objective:    Today's Vitals   04/04/23 1048 04/04/23 1050  BP: (!) 102/53   Pulse: 71   Resp: (!) 22   Temp: 97.7 F (36.5 C)   SpO2: 90%   Weight: 181 lb 6.4 oz (82.3 kg)   Height: 5\' 1"  (1.549 m)   PainSc:  4    Body mass index is 34.28 kg/m.     03/31/2023    9:39 AM 03/28/2023    6:00 AM 03/27/2023   10:24 PM 03/25/2023   10:29 AM 01/22/2023    9:58 AM 01/08/2023    9:54 AM 01/06/2023   11:09 AM  Advanced Directives  Does Patient Have a Medical Advance Directive? No Yes Yes No No No No  Type of Advance Directive   Living will      Would patient like information on creating a medical advance directive? No - Patient declined   No - Patient declined  No - Patient declined Yes (Inpatient - patient defers creating a medical advance directive at this time - Information given)    Current Medications (verified) Outpatient Encounter Medications as of 04/04/2023  Medication Sig   acetaminophen (TYLENOL) 650 MG CR tablet Take 1,300 mg by mouth at bedtime.   albuterol (VENTOLIN HFA) 108 (90 Base) MCG/ACT inhaler Inhale 2 puffs into the lungs every 4 (four) hours as needed for wheezing or shortness of breath.   alendronate (FOSAMAX) 70 MG tablet Take 70 mg by mouth every Sunday. Take with a full glass of water on an empty stomach.   Ascorbic Acid (VITAMIN C) 1000 MG tablet Take 1,000 mg by mouth every morning.   Cholecalciferol (VITAMIN D3) 50 MCG (2000 UT) TABS Take 2,000 Units by mouth every morning.   docusate sodium  (COLACE) 100 MG capsule Take 200 mg by mouth at bedtime.   dorzolamide-timolol (COSOPT) 22.3-6.8 MG/ML ophthalmic solution Place 1 drop into both eyes 2 (two) times daily.   ferrous gluconate (FERGON) 240 (27 FE) MG tablet Take 240 mg by mouth every morning. Take without food   latanoprost (XALATAN) 0.005 % ophthalmic solution Place 1 drop into both eyes at bedtime.   LIDOCAINE EX Apply 1 application  topically in the morning and at bedtime.   LIDOCAINE EX Apply 1 Application topically daily. Right Ankle.   METOPROLOL TARTRATE PO Take 6.25 mg by mouth in the morning and at bedtime.   Multiple Vitamins-Minerals (PRESERVISION AREDS 2) CAPS Take 1 capsule by mouth 2 (two) times daily.   Netarsudil Dimesylate (RHOPRESSA) 0.02 % SOLN Place 1 drop into both eyes at bedtime.   oxybutynin (DITROPAN-XL) 5 MG 24 hr tablet Take 5 mg by mouth daily.   pantoprazole (PROTONIX) 40 MG tablet Take 40 mg by mouth 2 (two) times daily.   polyethylene glycol (MIRALAX / GLYCOLAX) 17 g packet Take 8.5 g by mouth daily as needed (constipation).   POTASSIUM CHLORIDE PO Take 40 mEq by mouth daily.   Probiotic Product (ALIGN) 4 MG CAPS Take 4 mg by mouth at bedtime.   rOPINIRole (REQUIP) 0.5 MG tablet Take 0.5 mg  by mouth as needed.   sertraline (ZOLOFT) 50 MG tablet Take 1.5 tablets (75 mg total) by mouth daily.   simvastatin (ZOCOR) 20 MG tablet Take 20 mg by mouth every evening.   Sodium Fluoride (PREVIDENT 5000 BOOSTER PLUS) 1.1 % PSTE Place 1 Application onto teeth See admin instructions. Brush on teeth with a toothbrush after evening mouth care. Spit out excess and do not rinse.   umeclidinium-vilanterol (ANORO ELLIPTA) 62.5-25 MCG/ACT AEPB Inhale 1 puff into the lungs every morning.   zinc oxide 20 % ointment Apply 1 Application topically See admin instructions. Apply topically to buttocks after every incontinent episode and as needed for redness   No facility-administered encounter medications on file as of  04/04/2023.    Allergies (verified) Morphine and codeine, Aspirin, Duloxetine hcl, Tymlos [abaloparatide], Ventolin [albuterol], and Cefadroxil   History: Past Medical History:  Diagnosis Date   Anxiety    Arthritis    "bilateral knees, shoulders, elbows; neck, pretty widespread" (09/05/2017)   BPPV (benign paroxysmal positional vertigo)    Depression    GERD (gastroesophageal reflux disease)    Glaucoma, both eyes    Headache    "probably 2/month" (09/05/2017)   History of blood transfusion ~ 2008   "related to LGIB"   Hyperlipemia    Lower GI bleeding ~ 2008; 09/05/2017   "had to have blood transfusion"   Macular degeneration, bilateral    Osteopenia    Seasonal allergies    Skin cancer, basal cell 2001   "off my nose, left side"   Sleeping excessive    Tinnitus of both ears    Past Surgical History:  Procedure Laterality Date   BALLOON DILATION N/A 05/08/2021   Procedure: BALLOON DILATION;  Surgeon: Kerin Salen, MD;  Location: [facility] ENDOSCOPY;  Service: Gastroenterology;  Laterality: N/A;   BASAL CELL CARCINOMA EXCISION  2001   "off my nose, left side"   BIOPSY  05/08/2021   Procedure: BIOPSY;  Surgeon: Kerin Salen, MD;  Location: [facility] ENDOSCOPY;  Service: Gastroenterology;;   BLEPHAROPLASTY Bilateral    CATARACT EXTRACTION W/ INTRAOCULAR LENS  IMPLANT, BILATERAL Bilateral 1990's   COLONOSCOPY WITH PROPOFOL N/A 05/04/2022   Procedure: COLONOSCOPY WITH PROPOFOL;  Surgeon: Kerin Salen, MD;  Location: [facility] ENDOSCOPY;  Service: Gastroenterology;  Laterality: N/A;   ESOPHAGOGASTRODUODENOSCOPY (EGD) WITH PROPOFOL N/A 05/08/2021   Procedure: ESOPHAGOGASTRODUODENOSCOPY (EGD) WITH PROPOFOL;  Surgeon: Kerin Salen, MD;  Location: [facility] ENDOSCOPY;  Service: Gastroenterology;  Laterality: N/A;   ESOPHAGOGASTRODUODENOSCOPY (EGD) WITH PROPOFOL N/A 01/11/2022   Procedure: ESOPHAGOGASTRODUODENOSCOPY (EGD) WITH PROPOFOL;  Surgeon: Vida Rigger, MD;  Location: [facility] ENDOSCOPY;  Service: Gastroenterology;   Laterality: N/A;   ESOPHAGOGASTRODUODENOSCOPY (EGD) WITH PROPOFOL N/A 01/06/2023   Procedure: ESOPHAGOGASTRODUODENOSCOPY (EGD) WITH PROPOFOL;  Surgeon: Vida Rigger, MD;  Location: [facility] ENDOSCOPY;  Service: Gastroenterology;  Laterality: N/A;   EYE SURGERY Bilateral    "to improve vision after cataract OR"   IR ANGIOGRAM FOLLOW UP STUDY  12/19/2020   IR ANGIOGRAM SELECTIVE EACH ADDITIONAL VESSEL  12/19/2020   IR ANGIOGRAM SELECTIVE EACH ADDITIONAL VESSEL  12/19/2020   IR ANGIOGRAM VISCERAL SELECTIVE  12/19/2020   IR EMBO ART  VEN HEMORR LYMPH EXTRAV  INC GUIDE ROADMAPPING  12/19/2020   IR US GUIDE VASC ACCESS RIGHT  12/19/2020   JOINT REPLACEMENT     POLYPECTOMY  05/04/2022   Procedure: POLYPECTOMY;  Surgeon: Kerin Salen, MD;  Location: [facility] ENDOSCOPY;  Service: Gastroenterology;;   STAPEDES SURGERY Left    "scraped stapedes because it was  sticking when it wasn't suppose to"   TONSILLECTOMY AND ADENOIDECTOMY  1946   TOTAL KNEE ARTHROPLASTY Left ~ 2008   TOTAL KNEE ARTHROPLASTY  12/20/2011   Procedure: TOTAL KNEE ARTHROPLASTY;  Surgeon: Verlee Rossetti, MD;  Location: [facility] OR;  Service: Orthopedics;  Laterality: Right;  Right Total Knee Arthroplasty   TUBAL LIGATION  1980's   Family History  Problem Relation Age of Onset   Heart attack Mother    Heart disease Father    Heart failure Father    Bladder Cancer Father    Supraventricular tachycardia Sister    Hypertension Sister    Social History   Socioeconomic History   Marital status: Married    Spouse name: Rosanne Ashing    Number of children: 3   Years of education: College   Highest education level: Not on file  Occupational History   Occupation: Retired Runner, broadcasting/film/video  Tobacco Use   Smoking status: Former    Current packs/day: 0.00    Average packs/day: 0.8 packs/day for 37.0 years (27.8 ttl pk-yrs)    Types: Cigarettes    Start date: 07/02/1955    Quit date: 07/01/1992    Years since quitting: 30.7   Smokeless tobacco: Never  Vaping Use   Vaping  status: Never Used  Substance and Sexual Activity   Alcohol use: Yes    Alcohol/week: 7.0 standard drinks of alcohol    Types: 7 Glasses of wine per week    Comment: 09/05/2017  "6 oz wine, 5 days/wk"   Drug use: No    Types: Hydrocodone   Sexual activity: Not Currently  Other Topics Concern   Not on file  Social History Narrative   1-2 cups of coffee a day    Social Determinants of Health   Financial Resource Strain: Low Risk  (04/04/2023)   Overall Financial Resource Strain (CARDIA)    Difficulty of Paying Living Expenses: Not hard at all  Food Insecurity: No Food Insecurity (04/04/2023)   Hunger Vital Sign    Worried About Running Out of Food in the Last Year: Never true    Ran Out of Food in the Last Year: Never true  Transportation Needs: No Transportation Needs (04/04/2023)   PRAPARE - Administrator, Civil Service (Medical): No    Lack of Transportation (Non-Medical): No  Physical Activity: Insufficiently Active (04/04/2023)   Exercise Vital Sign    Days of Exercise per Week: 2 days    Minutes of Exercise per Session: 20 min  Stress: No Stress Concern Present (04/04/2023)   [employer] of Occupational Health - Occupational Stress Questionnaire    Feeling of Stress : Only a little  Social Connections: Moderately Isolated (04/04/2023)   Social Connection and Isolation Panel [NHANES]    Frequency of Communication with Friends and Family: More than three times a week    Frequency of Social Gatherings with Friends and Family: Twice a week    Attends Religious Services: Never    Database administrator or Organizations: No    Attends Engineer, structural: Never    Marital Status: Married    Tobacco Counseling Counseling given: Not Answered   Clinical Intake:  Pre-visit preparation completed: Yes  Pain : 0-10 Pain Score: 4  Pain Type: Chronic pain Pain Location: Knee Pain Orientation: Right, Left Pain Radiating Towards: none Pain Descriptors  / Indicators: Sharp Pain Onset: More than a month ago Pain Frequency: Intermittent Pain Relieving Factors: rest  Pain Relieving Factors:  rest  BMI - recorded: 34.28 Nutritional Status: BMI > 30  Obese Nutritional Risks: None Diabetes: No  How often do you need to have someone help you when you read instructions, pamphlets, or other written materials from your doctor or pharmacy?: 3 - Sometimes What is the last grade level you completed in school?: college  Interpreter Needed?: No      Activities of Daily Living    04/04/2023   10:53 AM 03/28/2023    6:00 AM  In your present state of health, do you have any difficulty performing the following activities:  Hearing? 1 1  Vision? 1 0  Difficulty concentrating or making decisions? 0 0  Walking or climbing stairs? 1   Dressing or bathing? 1   Doing errands, shopping? 1   Preparing Food and eating ? Y   Using the Toilet? N   In the past six months, have you accidently leaked urine? Y   Do you have problems with loss of bowel control? N   Managing your Medications? Y   Managing your Finances? Y   Comment Husband manages   Housekeeping or managing your Housekeeping? Y     Patient Care Team: Octavia Heir, NP as PCP - General (Adult Health Nurse Practitioner) [name], MD as Consulting Physician (Internal Medicine)  Indicate any recent Medical Services you may have received from other than [facility] providers in the past year (date may be approximate).     Assessment:   This is a routine wellness examination for [facility].  Hearing/Vision screen No results found.   Goals Addressed             This Visit's Progress    Maintain Mobility and Function   On track    Evidence-based guidance:  Acknowledge and validate impact of pain, loss of strength and potential disfigurement (hand osteoarthritis) on mental health and daily life, such as social isolation, anxiety, depression, impaired sexual relationship and   injury from  falls.  Anticipate referral to physical or occupational therapy for assessment, therapeutic exercise and recommendation for adaptive equipment or assistive devices; encourage participation.  Assess impact on ability to perform activities of daily living, as well as engage in sports and leisure events or requirements of work or school.  Provide anticipatory guidance and reassurance about the benefit of exercise to maintain function; acknowledge and normalize fear that exercise may worsen symptoms.  Encourage regular exercise, at least 10 minutes at a time for 45 minutes per week; consider yoga, water exercise and proprioceptive exercises; encourage use of wearable activity tracker to increase motivation and adherence.  Encourage maintenance or resumption of daily activities, including employment, as pain allows and with minimal exposure to trauma.  Assist patient to advocate for adaptations to the work environment.  Consider level of pain and function, gender, age, lifestyle, patient preference, quality of life, readiness and ?ocapacity to benefit? when recommending patients for orthopaedic surgery consultation.  Explore strategies, such as changes to medication regimen or activity that enables patient to anticipate and manage flare-ups that increase deconditioning and disability.  Explore patient preferences; encourage exposure to a broader range of activities that have been avoided for fear of experiencing pain.  Identify barriers to participation in therapy or exercise, such as pain with activity, anticipated or imagined pain.  Monitor postoperative joint replacement or any preexisting joint replacement for ongoing pain and loss of function; provide social support and encouragement throughout recovery.   Notes:  Depression Screen    04/04/2023   10:57 AM 01/22/2023    1:15 PM 12/20/2022    1:04 PM 12/09/2022   11:48 AM 10/30/2022   12:45 PM 07/12/2022   11:08 AM 05/17/2022   10:22 AM   PHQ 2/9 Scores  PHQ - 2 Score 1 2 2 2  0 0 0  PHQ- 9 Score  3 6 7        Fall Risk    04/04/2023   11:00 AM 01/22/2023    1:15 PM 10/30/2022   12:44 PM 07/12/2022   11:08 AM 05/17/2022   10:19 AM  Fall Risk   Falls in the past year? 0 0 0 0 1  Number falls in past yr: 0 0 0 0 0  Injury with Fall? 0 0 0 0 1  Risk for fall due to : History of fall(s);Impaired balance/gait;Impaired mobility History of fall(s);Impaired balance/gait History of fall(s);Impaired balance/gait;Impaired mobility History of fall(s) History of fall(s)  Follow up Falls evaluation completed;Education provided;Falls prevention discussed Falls evaluation completed;Education provided;Falls prevention discussed Falls evaluation completed;Education provided;Falls prevention discussed Falls evaluation completed Falls evaluation completed    MEDICARE RISK AT HOME: Medicare Risk at Home Any stairs in or around the home?: No If so, are there any without handrails?: No Home free of loose throw rugs in walkways, pet beds, electrical cords, etc?: Yes Adequate lighting in your home to reduce risk of falls?: Yes Life alert?: No Use of a cane, walker or w/c?: Yes Grab bars in the bathroom?: Yes Shower chair or bench in shower?: Yes Elevated toilet seat or a handicapped toilet?: Yes  TIMED UP AND GO:  Was the test performed?  No    Cognitive Function:    04/04/2023   11:00 AM 03/19/2022    3:28 PM  MMSE - Mini Mental State Exam  Not completed:  Refused  Orientation to time 5   Orientation to Place 5   Registration 3   Attention/ Calculation 5   Recall 1   Language- name 2 objects 2   Language- repeat 1   Language- follow 3 step command 3   Language- read & follow direction 1   Write a sentence 1   Copy design 0   Total score 27         Immunizations Immunization History  Administered Date(s) Administered   Fluad Quad(high Dose 65+) 04/24/2022   Influenza, High Dose Seasonal PF 03/14/2018   PFIZER(Purple  Top)SARS-COV-2 Vaccination 07/25/2019, 08/16/2019, 12/12/2021   Pneumococcal-Unspecified 03/02/2011   Tdap 03/12/2018   Zoster Recombinant(Shingrix) 10/08/2017, 12/09/2017    TDAP status: Up to date  Flu Vaccine status: Due, Education has been provided regarding the importance of this vaccine. Advised may receive this vaccine at local pharmacy or Health Dept. Aware to provide a copy of the vaccination record if obtained from local pharmacy or Health Dept. Verbalized acceptance and understanding.  Pneumococcal vaccine status: Due, Education has been provided regarding the importance of this vaccine. Advised may receive this vaccine at local pharmacy or Health Dept. Aware to provide a copy of the vaccination record if obtained from local pharmacy or Health Dept. Verbalized acceptance and understanding.  Covid-19 vaccine status: Completed vaccines  Qualifies for Shingles Vaccine? Yes   Zostavax completed No   Shingrix Completed?: Yes  Screening Tests Health Maintenance  Topic Date Due   DEXA SCAN  Never done   INFLUENZA VACCINE  01/30/2023   COVID-19 Vaccine (4 - 2023-24 season) 03/02/2023   Pneumonia Vaccine  45+ Years old (1 of 1 - PCV) 10/03/2023 (Originally 11/14/2003)   Medicare Annual Wellness (AWV)  04/03/2024   DTaP/Tdap/Td (2 - Td or Tdap) 03/12/2028   Zoster Vaccines- Shingrix  Completed   HPV VACCINES  Aged Out    Health Maintenance  Health Maintenance Due  Topic Date Due   DEXA SCAN  Never done   INFLUENZA VACCINE  01/30/2023   COVID-19 Vaccine (4 - 2023-24 season) 03/02/2023    Colorectal cancer screening: No longer required.   Mammogram status: No longer required due to advanced age.  Bone Density status: Completed unknown. Results reflect: Bone density results: OSTEOPOROSIS. Repeat every on Fosamax> unable to locate last study years.  Lung Cancer Screening: (Low Dose CT Chest recommended if Age 49-80 years, 20 pack-year currently smoking OR have quit w/in  15years.) does not qualify.   Lung Cancer Screening Referral: No  Additional Screening:  Hepatitis C Screening: does not qualify; Completed   Vision Screening: Recommended annual ophthalmology exams for early detection of glaucoma and other disorders of the eye. Is the patient up to date with their annual eye exam?  Yes  Who is the provider or what is the name of the office in which the patient attends annual eye exams? Dr. Emily Filbert If pt is not established with a provider, would they like to be referred to a provider to establish care? No .   Dental Screening: Recommended annual dental exams for proper oral hygiene  Diabetic Foot Exam: Diabetic Foot Exam: Completed 04/04/2023  Community Resource Referral / Chronic Care Management: CRR required this visit?  No   CCM required this visit?  No     Plan:     I have personally reviewed and noted the following in the patient's chart:   Medical and social history Use of alcohol, tobacco or illicit drugs  Current medications and supplements including opioid prescriptions. Patient is not currently taking opioid prescriptions. Functional ability and status Nutritional status Physical activity Advanced directives List of other physicians Hospitalizations, surgeries, and ER visits in previous 12 months Vitals Screenings to include cognitive, depression, and falls Referrals and appointments  In addition, I have reviewed and discussed with patient certain preventive protocols, quality metrics, and best practice recommendations. A written personalized care plan for preventive services as well as general preventive health recommendations were provided to patient.     Octavia Heir, NP   04/04/2023   After Visit Summary: (MyChart) Due to this being a telephonic visit, the after visit summary with patients personalized plan was offered to patient via MyChart   Nurse Notes: Lives in assisted living. She will receive flu/covid vaccines this  month per facility. Will order Prevnar 20 and DEXA. Unable to locate last bone density, she is on Foxamax.

## 2023-04-08 DIAGNOSIS — R41841 Cognitive communication deficit: Secondary | ICD-10-CM | POA: Diagnosis not present

## 2023-04-08 DIAGNOSIS — R1312 Dysphagia, oropharyngeal phase: Secondary | ICD-10-CM | POA: Diagnosis not present

## 2023-04-08 DIAGNOSIS — Z9181 History of falling: Secondary | ICD-10-CM | POA: Diagnosis not present

## 2023-04-08 DIAGNOSIS — M6281 Muscle weakness (generalized): Secondary | ICD-10-CM | POA: Diagnosis not present

## 2023-04-08 DIAGNOSIS — R2681 Unsteadiness on feet: Secondary | ICD-10-CM | POA: Diagnosis not present

## 2023-04-09 DIAGNOSIS — Z9181 History of falling: Secondary | ICD-10-CM | POA: Diagnosis not present

## 2023-04-09 DIAGNOSIS — M6281 Muscle weakness (generalized): Secondary | ICD-10-CM | POA: Diagnosis not present

## 2023-04-09 DIAGNOSIS — R2681 Unsteadiness on feet: Secondary | ICD-10-CM | POA: Diagnosis not present

## 2023-04-10 ENCOUNTER — Non-Acute Institutional Stay: Payer: Medicare HMO | Admitting: Internal Medicine

## 2023-04-10 ENCOUNTER — Encounter: Payer: Self-pay | Admitting: Internal Medicine

## 2023-04-10 DIAGNOSIS — G479 Sleep disorder, unspecified: Secondary | ICD-10-CM

## 2023-04-10 DIAGNOSIS — Z8719 Personal history of other diseases of the digestive system: Secondary | ICD-10-CM

## 2023-04-10 DIAGNOSIS — M810 Age-related osteoporosis without current pathological fracture: Secondary | ICD-10-CM

## 2023-04-10 DIAGNOSIS — R41841 Cognitive communication deficit: Secondary | ICD-10-CM | POA: Diagnosis not present

## 2023-04-10 DIAGNOSIS — I10 Essential (primary) hypertension: Secondary | ICD-10-CM | POA: Diagnosis not present

## 2023-04-10 DIAGNOSIS — D500 Iron deficiency anemia secondary to blood loss (chronic): Secondary | ICD-10-CM | POA: Diagnosis not present

## 2023-04-10 DIAGNOSIS — R1312 Dysphagia, oropharyngeal phase: Secondary | ICD-10-CM | POA: Diagnosis not present

## 2023-04-10 DIAGNOSIS — F339 Major depressive disorder, recurrent, unspecified: Secondary | ICD-10-CM

## 2023-04-10 DIAGNOSIS — N3946 Mixed incontinence: Secondary | ICD-10-CM

## 2023-04-10 NOTE — Progress Notes (Signed)
Location:  [facility] Room Number: 20A Place of Service:  ALF [phone]) Provider:  Vance Gather, NP  Patient Care Team: Octavia Heir, NP as PCP - General (Adult Health Nurse Practitioner) [name], MD as Consulting Physician (Internal Medicine)  Extended Emergency Contact Information Primary Emergency Contact: Tiedeman,[name] Address: [address]          [place], [place] [address] [name] Home Phone: [phone] Mobile Phone: [phone] Relation: Spouse Secondary Emergency Contact: Malyn, Aytes Mobile Phone: [phone] Relation: Son Interpreter needed? No  Code Status:  Full Code Goals of care: Advanced Directive information    04/10/2023    4:04 PM  Advanced Directives  Does Patient Have a Medical Advance Directive? No  Would patient like information on creating a medical advance directive? No - Patient declined     Chief Complaint  Patient presents with   Medical Management of Chronic Issues    Patient is being seen for a routine visit    Immunizations    Patient is due for flu and covid vaccine   Health Maintenance    Patient is due for Bone density     HPI:  Pt is a 82 y.o. female seen today for medical management of chronic diseases.    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 H/o Former  Smoking  history of  pulmonary nodules Hypertension Osteoporosis  Urge incontinence  Walks with her Walker No Falls Weight is stable Has Runny nose wants something Prn Does not like Zyrtec  Wt Readings from Last 3 Encounters:  04/10/23 181 lb 6.4 oz (82.3  kg)  04/04/23 181 lb 6.4 oz (82.3 kg)  03/31/23 184 lb 3.2 oz (83.6 kg)     Past Medical History:  Diagnosis Date   Anxiety    Arthritis    "bilateral knees, shoulders, elbows; neck, pretty widespread" (09/05/2017)   BPPV (benign paroxysmal positional vertigo)    Depression    GERD (gastroesophageal reflux disease)    Glaucoma, both eyes    Headache    "probably 2/month" (09/05/2017)   History of blood transfusion ~ 2008   "related to LGIB"   Hyperlipemia    Lower GI bleeding ~ 2008; 09/05/2017   "had to have blood transfusion"   Macular degeneration, bilateral    Osteopenia    Seasonal allergies    Skin cancer, basal cell 2001   "off my nose, left side"   Sleeping excessive    Tinnitus of both ears    Past Surgical History:  Procedure Laterality Date   BALLOON DILATION N/A 05/08/2021   Procedure: BALLOON DILATION;  Surgeon: Kerin Salen, MD;  Location: [facility] ENDOSCOPY;  Service: Gastroenterology;  Laterality: N/A;   BASAL CELL CARCINOMA EXCISION  2001   "off my nose, left side"   BIOPSY  05/08/2021   Procedure: BIOPSY;  Surgeon: Kerin Salen, MD;  Location[facility] ENDOSCOPY;  Service: Gastroenterology;;   BLEPHAROPLASTY Bilateral    CATARACT EXTRACTION W/ INTRAOCULAR LENS  IMPLANT, BILATERAL Bilateral 1990's   COLONOSCOPY WITH PROPOFOL N/A 05/04/2022   Procedure: COLONOSCOPY WITH PROPOFOL;  Surgeon: Kerin Salen, MD;  Location: [facility] ENDOSCOPY;  Service: Gastroenterology;  Laterality: N/A;   ESOPHAGOGASTRODUODENOSCOPY (EGD) WITH PROPOFOL N/A 05/08/2021   Procedure: ESOPHAGOGASTRODUODENOSCOPY (EGD) WITH PROPOFOL;  Surgeon: Kerin Salen, MD;  Location: [facility] ENDOSCOPY;  Service: Gastroenterology;  Laterality: N/A;   ESOPHAGOGASTRODUODENOSCOPY (EGD) WITH PROPOFOL N/A 01/11/2022   Procedure: ESOPHAGOGASTRODUODENOSCOPY (EGD) WITH PROPOFOL;  Surgeon: Vida Rigger, MD;  Location: [facility] ENDOSCOPY;  Service: Gastroenterology;  Laterality: N/A;   ESOPHAGOGASTRODUODENOSCOPY (EGD) WITH PROPOFOL N/A 01/06/2023    Procedure: ESOPHAGOGASTRODUODENOSCOPY (EGD) WITH PROPOFOL;  Surgeon: Vida Rigger, MD;  Location: [facility] ENDOSCOPY;  Service: Gastroenterology;  Laterality: N/A;   EYE SURGERY Bilateral    "to improve vision after cataract OR"   IR ANGIOGRAM FOLLOW UP STUDY  12/19/2020   IR ANGIOGRAM SELECTIVE EACH ADDITIONAL VESSEL  12/19/2020   IR ANGIOGRAM SELECTIVE EACH ADDITIONAL VESSEL  12/19/2020   IR ANGIOGRAM VISCERAL SELECTIVE  12/19/2020   IR EMBO ART  VEN HEMORR LYMPH EXTRAV  INC GUIDE ROADMAPPING  12/19/2020   IR US GUIDE VASC ACCESS RIGHT  12/19/2020   JOINT REPLACEMENT     POLYPECTOMY  05/04/2022   Procedure: POLYPECTOMY;  Surgeon: Kerin Salen, MD;  Location: [facility] ENDOSCOPY;  Service: Gastroenterology;;   STAPEDES SURGERY Left    "scraped stapedes because it was sticking when it wasn't suppose to"   TONSILLECTOMY AND ADENOIDECTOMY  1946   TOTAL KNEE ARTHROPLASTY Left ~ 2008   TOTAL KNEE ARTHROPLASTY  12/20/2011   Procedure: TOTAL KNEE ARTHROPLASTY;  Surgeon: Verlee Rossetti, MD;  Location: [facility] OR;  Service: Orthopedics;  Laterality: Right;  Right Total Knee Arthroplasty   TUBAL LIGATION  1980's    Allergies  Allergen Reactions   Morphine And Codeine Itching   Aspirin Other (See Comments)    Unknown reaction   Duloxetine Hcl Other (See Comments)    Unknown reaction   Tymlos [Abaloparatide] Other (See Comments)    Unknown reaction   Ventolin [Albuterol] Other (See Comments)    rapid heart beat   Cefadroxil Hives    Patient can take amoxicillin and cipro    Outpatient Encounter Medications as of 04/10/2023  Medication Sig   acetaminophen (TYLENOL) 650 MG CR tablet Take 1,300 mg by mouth at bedtime.   albuterol (VENTOLIN HFA) 108 (90 Base) MCG/ACT inhaler Inhale 2 puffs into the lungs every 4 (four) hours as needed for wheezing or shortness of breath.   alendronate (FOSAMAX) 70 MG tablet Take 70 mg by mouth every Sunday. Take with a full glass of water on an empty stomach.   Ascorbic Acid (VITAMIN  C) 1000 MG tablet Take 1,000 mg by mouth every morning.   Cholecalciferol (VITAMIN D3) 50 MCG (2000 UT) TABS Take 2,000 Units by mouth every morning.   docusate sodium (COLACE) 100 MG capsule Take 200 mg by mouth at bedtime.   dorzolamide-timolol (COSOPT) 22.3-6.8 MG/ML ophthalmic solution Place 1 drop into both eyes 2 (two) times daily.   ferrous gluconate (FERGON) 240 (27 FE) MG tablet Take 240 mg by mouth every morning. Take without food   latanoprost (XALATAN) 0.005 % ophthalmic solution Place 1 drop into both eyes at bedtime.   LIDOCAINE EX Apply 1 application  topically in the morning and at bedtime.   LIDOCAINE EX Apply 1 Application topically daily. Right Ankle.   METOPROLOL TARTRATE PO Take 6.25 mg by mouth in the morning and at bedtime.   Multiple Vitamins-Minerals (PRESERVISION AREDS 2) CAPS Take 1 capsule by mouth 2 (two) times daily.   Netarsudil Dimesylate (RHOPRESSA) 0.02 %  SOLN Place 1 drop into both eyes at bedtime.   oxybutynin (DITROPAN-XL) 5 MG 24 hr tablet Take 5 mg by mouth daily.   pantoprazole (PROTONIX) 40 MG tablet Take 40 mg by mouth 2 (two) times daily.   polyethylene glycol (MIRALAX / GLYCOLAX) 17 g packet Take 8.5 g by mouth daily as needed (constipation).   POTASSIUM CHLORIDE PO Take 40 mEq by mouth daily.   Probiotic Product (ALIGN) 4 MG CAPS Take 4 mg by mouth at bedtime.   rOPINIRole (REQUIP) 0.5 MG tablet Take 0.5 mg by mouth as needed.   sertraline (ZOLOFT) 50 MG tablet Take 1.5 tablets (75 mg total) by mouth daily.   simvastatin (ZOCOR) 20 MG tablet Take 20 mg by mouth every evening.   Sodium Fluoride (PREVIDENT 5000 BOOSTER PLUS) 1.1 % PSTE Place 1 Application onto teeth See admin instructions. Brush on teeth with a toothbrush after evening mouth care. Spit out excess and do not rinse.   umeclidinium-vilanterol (ANORO ELLIPTA) 62.5-25 MCG/ACT AEPB Inhale 1 puff into the lungs every morning.   zinc oxide 20 % ointment Apply 1 Application topically See admin  instructions. Apply topically to buttocks after every incontinent episode and as needed for redness   No facility-administered encounter medications on file as of 04/10/2023.    Review of Systems  Constitutional:  Negative for activity change and appetite change.  HENT:  Positive for postnasal drip and rhinorrhea.   Respiratory:  Negative for cough and shortness of breath.   Cardiovascular:  Negative for leg swelling.  Gastrointestinal:  Negative for constipation.  Genitourinary: Negative.   Musculoskeletal:  Positive for gait problem. Negative for arthralgias and myalgias.  Skin: Negative.   Neurological:  Negative for dizziness and weakness.  Psychiatric/Behavioral:  Negative for confusion, dysphoric mood and sleep disturbance.     Immunization History  Administered Date(s) Administered   Fluad Quad(high Dose 65+) 04/24/2022   Influenza, High Dose Seasonal PF 03/14/2018   PFIZER(Purple Top)SARS-COV-2 Vaccination 07/25/2019, 08/16/2019, 12/12/2021   Pneumococcal-Unspecified 03/02/2011   Tdap 03/12/2018   Zoster Recombinant(Shingrix) 10/08/2017, 12/09/2017   Pertinent  Health Maintenance Due  Topic Date Due   DEXA SCAN  Never done   INFLUENZA VACCINE  01/30/2023      05/17/2022   10:19 AM 07/12/2022   11:08 AM 10/30/2022   12:44 PM 01/22/2023    1:15 PM 04/04/2023   11:00 AM  Fall Risk  Falls in the past year? 1 0 0 0 0  Was there an injury with Fall? 1 0 0 0 0  Fall Risk Category Calculator 2 0 0 0 0  Fall Risk Category (Retired) Moderate Low     (RETIRED) Patient Fall Risk Level Moderate fall risk Low fall risk     Patient at Risk for Falls Due to History of fall(s) History of fall(s) History of fall(s);Impaired balance/gait;Impaired mobility History of fall(s);Impaired balance/gait History of fall(s);Impaired balance/gait;Impaired mobility  Fall risk Follow up Falls evaluation completed Falls evaluation completed Falls evaluation completed;Education provided;Falls  prevention discussed Falls evaluation completed;Education provided;Falls prevention discussed Falls evaluation completed;Education provided;Falls prevention discussed   Functional Status Survey:    Vitals:   04/10/23 1603  BP: 112/67  Pulse: 65  Resp: 18  Temp: (!) 97.2 F (36.2 C)  TempSrc: Temporal  SpO2: 96%  Weight: 181 lb 6.4 oz (82.3 kg)  Height: 5\' 1"  (1.549 m)   Body mass index is 34.28 kg/m. Physical Exam Vitals reviewed.  Constitutional:      Appearance: Normal  appearance.  HENT:     Head: Normocephalic.     Nose: Nose normal.     Mouth/Throat:     Mouth: Mucous membranes are moist.     Pharynx: Oropharynx is clear.  Eyes:     Pupils: Pupils are equal, round, and reactive to light.  Cardiovascular:     Rate and Rhythm: Normal rate and regular rhythm.     Pulses: Normal pulses.     Heart sounds: Normal heart sounds. No murmur heard. Pulmonary:     Effort: Pulmonary effort is normal.     Breath sounds: Normal breath sounds.  Abdominal:     General: Abdomen is flat. Bowel sounds are normal.     Palpations: Abdomen is soft.  Musculoskeletal:        General: No swelling.     Cervical back: Neck supple.  Skin:    General: Skin is warm.  Neurological:     General: No focal deficit present.     Mental Status: She is alert and oriented to person, place, and time.  Psychiatric:        Mood and Affect: Mood normal.        Thought Content: Thought content normal.     Labs reviewed: Recent Labs    05/05/22 0008 05/05/22 0631 03/27/23 2234 03/28/23 0424 03/29/23 0843  NA  --    < > 139 142 142  K  --    < > 4.1 3.5 3.7  CL  --    < > 105 108 108  CO2  --    < > 26 25 26   GLUCOSE  --    < > 117* 111* 119*  BUN  --    < > 17 13 6*  CREATININE  --    < > 0.59 0.32* 0.37*  CALCIUM  --    < > 9.0 8.7* 9.1  MG 1.9  --   --   --   --    < > = values in this interval not displayed.   Recent Labs    01/07/23 0506 01/14/23 0000 03/28/23 0424  03/29/23 0843  AST 15 12* 15 16  ALT 10 8 11 10   ALKPHOS 31* 40 39 40  BILITOT 0.5  --  0.8 0.6  PROT 5.8*  --  6.1* 6.7  ALBUMIN 3.3* 3.7 3.5 3.8   Recent Labs    01/07/23 0506 01/14/23 0000 02/07/23 0000 03/27/23 2234 03/28/23 0424 03/28/23 0833 03/28/23 1255 03/28/23 1812 03/29/23 0843  WBC 6.0 5.9 6.4 8.4 5.7  --   --   --   --   NEUTROABS 3.3 3,133.00 3,859.00 6.0  --   --   --   --   --   HGB 8.9* 9.1* 11.4* 10.5* 9.7*   < > 9.8* 10.9* 11.2*  HCT 28.6* 28* 35* 34.0* 31.2*   < > 31.2* 35.0* 35.5*  MCV 100.7*  --   --  100.3* 101.0*  --   --   --   --   PLT 137* 198 186 166 138*  --   --   --   --    < > = values in this interval not displayed.   Lab Results  Component Value Date   TSH 2.47 10/08/2022   No results found for: "HGBA1C" Lab Results  Component Value Date   CHOL 147 02/18/2022   HDL 51 02/18/2022   LDLCALC 72 02/18/2022   TRIG 162 (A)  02/18/2022    Significant Diagnostic Results in last 30 days:  No results found.  Assessment/Plan 1. Senile osteoporosis Fosamax since 12/22 Repeat DEXA pending  2. History of GI diverticular bleed Per GI manage Conservatively I have written order to check CBC only if she has more then 2 blood y stools and send to ED only if HGB less then 8  3. Iron deficiency anemia due to chronic blood loss On Iron  4. Primary hypertension/Tachycardia Low dose of Lopressor   5. Recurrent depression (HCC) Zoloft  6. Mixed stress and urge urinary incontinence Ditropan  7. Sleep disturbance  8 Chronic Bronchitis On Anoro And Oxygen at night 9 HLD On statin Needs follow up of her Lipid  10 RLS On Requip Prn 11 Rhinorrhea Made Zyrtec Prn per her request Also added Flonase PRn   Family/ staff Communication:   Labs/tests ordered:

## 2023-04-11 DIAGNOSIS — M6281 Muscle weakness (generalized): Secondary | ICD-10-CM | POA: Diagnosis not present

## 2023-04-11 DIAGNOSIS — Z9181 History of falling: Secondary | ICD-10-CM | POA: Diagnosis not present

## 2023-04-11 DIAGNOSIS — R2681 Unsteadiness on feet: Secondary | ICD-10-CM | POA: Diagnosis not present

## 2023-04-13 DIAGNOSIS — N3946 Mixed incontinence: Secondary | ICD-10-CM | POA: Insufficient documentation

## 2023-04-13 DIAGNOSIS — F339 Major depressive disorder, recurrent, unspecified: Secondary | ICD-10-CM | POA: Insufficient documentation

## 2023-04-15 DIAGNOSIS — M6281 Muscle weakness (generalized): Secondary | ICD-10-CM | POA: Diagnosis not present

## 2023-04-15 DIAGNOSIS — R2681 Unsteadiness on feet: Secondary | ICD-10-CM | POA: Diagnosis not present

## 2023-04-15 DIAGNOSIS — Z9181 History of falling: Secondary | ICD-10-CM | POA: Diagnosis not present

## 2023-04-15 DIAGNOSIS — R41841 Cognitive communication deficit: Secondary | ICD-10-CM | POA: Diagnosis not present

## 2023-04-15 DIAGNOSIS — R1312 Dysphagia, oropharyngeal phase: Secondary | ICD-10-CM | POA: Diagnosis not present

## 2023-04-16 ENCOUNTER — Non-Acute Institutional Stay: Payer: Medicare HMO | Admitting: Orthopedic Surgery

## 2023-04-16 ENCOUNTER — Encounter: Payer: Self-pay | Admitting: Orthopedic Surgery

## 2023-04-16 DIAGNOSIS — M6281 Muscle weakness (generalized): Secondary | ICD-10-CM | POA: Diagnosis not present

## 2023-04-16 DIAGNOSIS — K921 Melena: Secondary | ICD-10-CM

## 2023-04-16 DIAGNOSIS — R2681 Unsteadiness on feet: Secondary | ICD-10-CM | POA: Diagnosis not present

## 2023-04-16 DIAGNOSIS — Z9181 History of falling: Secondary | ICD-10-CM | POA: Diagnosis not present

## 2023-04-16 NOTE — Progress Notes (Signed)
Location:  [facility] Room Number: 20/A Place of Service:  ALF [phone]) Provider:  Octavia Heir, NP   Octavia Heir, NP  Patient Care Team: Octavia Heir, NP as PCP - General (Adult Health Nurse Practitioner) [name], MD as Consulting Physician (Internal Medicine)  Extended Emergency Contact Information Primary Emergency Contact: Pustejovsky,[name] Address: [address]          [place], [place] [address] [name] of [place] Home Phone: [phone] Mobile Phone: [phone] Relation: Spouse Secondary Emergency Contact: Lynna, Zamorano Mobile Phone: [phone] Relation: Son Interpreter needed? No  Code Status:  DNR Goals of care: Advanced Directive information    04/10/2023    4:04 PM  Advanced Directives  Does Patient Have a Medical Advance Directive? No  Would patient like information on creating a medical advance directive? No - Patient declined     Chief Complaint  Patient presents with   Acute Visit    Bloody stools    HPI:  Pt is a 82 y.o. female seen today for acute visit due to bloody stools.   She currently resides on the assisted living unit at [facility] PMH: HTN, COPD, pulmonary nodule, Diverticulosis with GI bleed, GERD, OA, osteoporosis,macular degeneration, iron def anemia, unstable gait, anxiety, and depression.   H/o diverticular bleed 11/2020 s/p embolization with recurrence. Hospitalized 05/2022 and 07/07 due to bloody stools. Colonoscopy 2023 noted blood in colon and diffuse diverticulosis. Past EGD noted gastric polyps, duodenal bulb and gastritis."  09/30 hospitalized due to bloody stools. Hgb was noted to be 10.5. GI recommended conservative management. Remains on Protonix 40 mg po BID.   10/15 she had one episode of bloody stools before bedtime. This morning she has had 2 more episodes. Recent hgb 10.8 04/03/2023. She denies abdominal pain, N/V, and dizziness. Admits to fatigue. Afebrile. Vitals stable.     Past Medical History:  Diagnosis Date   Anxiety    Arthritis    "bilateral knees, shoulders, elbows; neck, pretty widespread" (09/05/2017)   BPPV (benign paroxysmal positional vertigo)    Depression    GERD (gastroesophageal reflux disease)    Glaucoma, both eyes    Headache    "probably 2/month" (09/05/2017)   History of blood transfusion ~ 2008   "related to LGIB"   Hyperlipemia    Lower GI bleeding ~ 2008; 09/05/2017   "had to have blood transfusion"   Macular degeneration, bilateral    Osteopenia    Seasonal allergies    Skin cancer, basal cell 2001   "off my nose, left side"   Sleeping excessive    Tinnitus of both ears    Past Surgical History:  Procedure Laterality Date   BALLOON DILATION N/A 05/08/2021   Procedure: BALLOON DILATION;  Surgeon: Kerin Salen, MD;  Location: [facility] ENDOSCOPY;  Service: Gastroenterology;  Laterality: N/A;   BASAL CELL CARCINOMA EXCISION  2001   "off my nose, left side"   BIOPSY  05/08/2021   Procedure: BIOPSY;  Surgeon: Kerin Salen, MD;  Location: [facility] ENDOSCOPY;  Service: Gastroenterology;;   BLEPHAROPLASTY Bilateral    CATARACT EXTRACTION W/ INTRAOCULAR LENS  IMPLANT, BILATERAL Bilateral 1990's   COLONOSCOPY WITH PROPOFOL N/A 05/04/2022   Procedure: COLONOSCOPY WITH PROPOFOL;  Surgeon: Kerin Salen, MD;  Location: [facility] ENDOSCOPY;  Service: Gastroenterology;  Laterality: N/A;   ESOPHAGOGASTRODUODENOSCOPY (EGD) WITH PROPOFOL N/A 05/08/2021   Procedure: ESOPHAGOGASTRODUODENOSCOPY (EGD) WITH PROPOFOL;  Surgeon: Kerin Salen, MD;  Location: [facility] ENDOSCOPY;  Service: Gastroenterology;  Laterality: N/A;   ESOPHAGOGASTRODUODENOSCOPY (EGD) WITH PROPOFOL N/A 01/11/2022   Procedure: ESOPHAGOGASTRODUODENOSCOPY (EGD) WITH PROPOFOL;  Surgeon: Vida Rigger, MD;  Location: [facility] ENDOSCOPY;  Service: Gastroenterology;  Laterality: N/A;   ESOPHAGOGASTRODUODENOSCOPY (EGD) WITH PROPOFOL N/A 01/06/2023   Procedure: ESOPHAGOGASTRODUODENOSCOPY (EGD) WITH PROPOFOL;  Surgeon: Vida Rigger, MD;  Location: [facility] ENDOSCOPY;  Service: Gastroenterology;  Laterality: N/A;   EYE SURGERY Bilateral    "to improve vision after cataract OR"   IR ANGIOGRAM FOLLOW UP STUDY  12/19/2020   IR ANGIOGRAM SELECTIVE EACH ADDITIONAL VESSEL  12/19/2020   IR ANGIOGRAM SELECTIVE EACH ADDITIONAL VESSEL  12/19/2020   IR ANGIOGRAM VISCERAL SELECTIVE  12/19/2020   IR EMBO ART  VEN HEMORR LYMPH EXTRAV  INC GUIDE ROADMAPPING  12/19/2020   IR US GUIDE VASC ACCESS RIGHT  12/19/2020   JOINT REPLACEMENT     POLYPECTOMY  05/04/2022   Procedure: POLYPECTOMY;  Surgeon: Kerin Salen, MD;  Location: [facility] ENDOSCOPY;  Service: Gastroenterology;;   STAPEDES SURGERY Left    "scraped stapedes because it was sticking when it wasn't suppose to"   TONSILLECTOMY AND ADENOIDECTOMY  1946   TOTAL KNEE ARTHROPLASTY Left ~ 2008   TOTAL KNEE ARTHROPLASTY  12/20/2011   Procedure: TOTAL KNEE ARTHROPLASTY;  Surgeon: Verlee Rossetti, MD;  Location: [facility] OR;  Service: Orthopedics;  Laterality: Right;  Right Total Knee Arthroplasty   TUBAL LIGATION  1980's    Allergies  Allergen Reactions   Morphine And Codeine Itching   Aspirin Other (See Comments)    Unknown reaction   Duloxetine Hcl Other (See Comments)    Unknown reaction   Tymlos [Abaloparatide] Other (See Comments)    Unknown reaction   Ventolin [Albuterol] Other (See Comments)    rapid heart beat   Cefadroxil Hives    Patient can take amoxicillin and cipro    Outpatient Encounter Medications as of 04/16/2023  Medication Sig   acetaminophen (TYLENOL) 650 MG CR tablet Take 1,300 mg by mouth at bedtime.   albuterol (VENTOLIN HFA) 108 (90 Base) MCG/ACT inhaler Inhale 2 puffs into the lungs every 4 (four) hours as needed for wheezing or shortness of breath.   alendronate (FOSAMAX) 70 MG tablet Take 70 mg by mouth every Sunday. Take with a full glass of water on an empty stomach.   Ascorbic Acid (VITAMIN C) 1000 MG tablet Take 1,000 mg by mouth every morning.   cetirizine  (ZYRTEC) 5 MG chewable tablet Chew 5 mg by mouth daily as needed for allergies.   Cholecalciferol (VITAMIN D3) 50 MCG (2000 UT) TABS Take 2,000 Units by mouth every morning.   docusate sodium (COLACE) 100 MG capsule Take 200 mg by mouth at bedtime.   dorzolamide-timolol (COSOPT) 22.3-6.8 MG/ML ophthalmic solution Place 1 drop into both eyes 2 (two) times daily.   ferrous gluconate (FERGON) 240 (27 FE) MG tablet Take 240 mg by mouth every morning. Take without food   fluticasone (FLONASE) 50 MCG/ACT nasal spray Place 1 spray into both nostrils daily as needed for allergies or rhinitis.   latanoprost (XALATAN) 0.005 % ophthalmic solution Place 1 drop into both eyes at bedtime.   LIDOCAINE EX Apply 1 application  topically in the morning and at bedtime.   LIDOCAINE EX Apply 1 Application topically daily. Right Ankle.   METOPROLOL TARTRATE PO Take 6.25 mg by mouth in the morning and at bedtime.   Multiple Vitamins-Minerals (PRESERVISION AREDS 2) CAPS Take 1 capsule by mouth 2 (two) times daily.   Netarsudil Dimesylate (  RHOPRESSA) 0.02 % SOLN Place 1 drop into both eyes at bedtime.   oxybutynin (DITROPAN-XL) 5 MG 24 hr tablet Take 5 mg by mouth daily.   pantoprazole (PROTONIX) 40 MG tablet Take 40 mg by mouth 2 (two) times daily.   polyethylene glycol (MIRALAX / GLYCOLAX) 17 g packet Take 8.5 g by mouth daily as needed (constipation).   Probiotic Product (ALIGN) 4 MG CAPS Take 4 mg by mouth at bedtime.   rOPINIRole (REQUIP) 0.5 MG tablet Take 0.5 mg by mouth as needed.   sertraline (ZOLOFT) 50 MG tablet Take 1.5 tablets (75 mg total) by mouth daily.   simvastatin (ZOCOR) 20 MG tablet Take 20 mg by mouth every evening.   Sodium Fluoride (PREVIDENT 5000 BOOSTER PLUS) 1.1 % PSTE Place 1 Application onto teeth See admin instructions. Brush on teeth with a toothbrush after evening mouth care. Spit out excess and do not rinse.   umeclidinium-vilanterol (ANORO ELLIPTA) 62.5-25 MCG/ACT AEPB Inhale 1 puff into  the lungs every morning.   zinc oxide 20 % ointment Apply 1 Application topically See admin instructions. Apply topically to buttocks after every incontinent episode and as needed for redness   No facility-administered encounter medications on file as of 04/16/2023.    Review of Systems  Constitutional:  Positive for fatigue.  Cardiovascular:  Negative for chest pain and leg swelling.  Gastrointestinal:  Positive for blood in stool. Negative for abdominal distention, abdominal pain, constipation, diarrhea, nausea and vomiting.  Neurological:  Positive for weakness. Negative for dizziness and light-headedness.  Psychiatric/Behavioral:  Negative for dysphoric mood. The patient is not nervous/anxious.     Immunization History  Administered Date(s) Administered   Fluad Quad(high Dose 65+) 04/24/2022   Influenza, High Dose Seasonal PF 03/14/2018   PFIZER(Purple Top)SARS-COV-2 Vaccination 07/25/2019, 08/16/2019, 12/12/2021   Pneumococcal-Unspecified 03/02/2011   Tdap 03/12/2018   Zoster Recombinant(Shingrix) 10/08/2017, 12/09/2017   Pertinent  Health Maintenance Due  Topic Date Due   DEXA SCAN  Never done   INFLUENZA VACCINE  01/30/2023      05/17/2022   10:19 AM 07/12/2022   11:08 AM 10/30/2022   12:44 PM 01/22/2023    1:15 PM 04/04/2023   11:00 AM  Fall Risk  Falls in the past year? 1 0 0 0 0  Was there an injury with Fall? 1 0 0 0 0  Fall Risk Category Calculator 2 0 0 0 0  Fall Risk Category (Retired) Moderate Low     (RETIRED) Patient Fall Risk Level Moderate fall risk Low fall risk     Patient at Risk for Falls Due to History of fall(s) History of fall(s) History of fall(s);Impaired balance/gait;Impaired mobility History of fall(s);Impaired balance/gait History of fall(s);Impaired balance/gait;Impaired mobility  Fall risk Follow up Falls evaluation completed Falls evaluation completed Falls evaluation completed;Education provided;Falls prevention discussed Falls evaluation  completed;Education provided;Falls prevention discussed Falls evaluation completed;Education provided;Falls prevention discussed   Functional Status Survey:    Vitals:   04/16/23 1527  BP: 112/67  Pulse: 65  Resp: 18  Temp: (!) 97.2 F (36.2 C)  SpO2: 96%  Weight: 181 lb 6.4 oz (82.3 kg)  Height: 5\' 1"  (1.549 m)   Body mass index is 34.28 kg/m. Physical Exam Vitals reviewed.  Constitutional:      General: She is not in acute distress. HENT:     Head: Normocephalic.  Eyes:     General:        Right eye: No discharge.  Left eye: No discharge.  Cardiovascular:     Rate and Rhythm: Normal rate and regular rhythm.     Pulses: Normal pulses.     Heart sounds: Normal heart sounds.  Pulmonary:     Effort: Pulmonary effort is normal. No respiratory distress.     Breath sounds: Normal breath sounds. No wheezing.  Abdominal:     General: Bowel sounds are normal. There is no distension.     Palpations: Abdomen is soft. There is no mass.     Tenderness: There is no abdominal tenderness. There is no guarding or rebound.     Hernia: No hernia is present.  Musculoskeletal:     Cervical back: Neck supple.     Right lower leg: No edema.     Left lower leg: No edema.  Skin:    General: Skin is warm.     Capillary Refill: Capillary refill takes less than 2 seconds.  Neurological:     General: No focal deficit present.     Mental Status: She is alert and oriented to person, place, and time.     Motor: Weakness present.     Gait: Gait abnormal.     Comments: walker  Psychiatric:        Mood and Affect: Mood normal.     Labs reviewed: Recent Labs    05/05/22 0008 05/05/22 0631 03/27/23 2234 03/28/23 0424 03/29/23 0843 04/03/23 0000  NA  --    < > 139 142 142 140  K  --    < > 4.1 3.5 3.7 4.5  CL  --    < > 105 108 108 104  CO2  --    < > 26 25 26  28*  GLUCOSE  --    < > 117* 111* 119*  --   BUN  --    < > 17 13 6* 8  CREATININE  --    < > 0.59 0.32* 0.37* 0.6   CALCIUM  --    < > 9.0 8.7* 9.1 9.0  MG 1.9  --   --   --   --   --    < > = values in this interval not displayed.   Recent Labs    01/07/23 0506 01/14/23 0000 03/28/23 0424 03/29/23 0843 04/03/23 0000  AST 15   < > 15 16 14   ALT 10   < > 11 10 9   ALKPHOS 31*   < > 39 40 48  BILITOT 0.5  --  0.8 0.6  --   PROT 5.8*  --  6.1* 6.7  --   ALBUMIN 3.3*   < > 3.5 3.8 4.2   < > = values in this interval not displayed.   Recent Labs    01/07/23 0506 01/14/23 0000 02/07/23 0000 03/27/23 2234 03/28/23 0424 03/28/23 0833 03/28/23 1812 03/29/23 0843 04/03/23 0000  WBC 6.0   < > 6.4 8.4 5.7  --   --   --  5.7  NEUTROABS 3.3   < > 3,859.00 6.0  --   --   --   --  33,335.00  HGB 8.9*   < > 11.4* 10.5* 9.7*   < > 10.9* 11.2* 10.8*  HCT 28.6*   < > 35* 34.0* 31.2*   < > 35.0* 35.5* 34*  MCV 100.7*  --   --  100.3* 101.0*  --   --   --   --   PLT 137*   < >  186 166 138*  --   --   --  195   < > = values in this interval not displayed.   Lab Results  Component Value Date   TSH 2.47 10/08/2022   No results found for: "HGBA1C" Lab Results  Component Value Date   CHOL 147 02/18/2022   HDL 51 02/18/2022   LDLCALC 72 02/18/2022   TRIG 162 (A) 02/18/2022    Significant Diagnostic Results in last 30 days:  No results found.  Assessment/Plan 1. Bloody stools - ongoing - recently hospitalized 09/30> hemoccult +> GI recommended conservative management - recent hgb 10.8 04/03/2023 - recommend monitoring in facility> send to ED if hgb < 8 - cbc/diff 10/17 - cont Protonix and ferrous sulfate     Family/ staff Communication: plan discussed with patient and nurse  Labs/tests ordered:  cbc/diff 10/17

## 2023-04-17 ENCOUNTER — Telehealth: Payer: Self-pay | Admitting: Internal Medicine

## 2023-04-17 DIAGNOSIS — D649 Anemia, unspecified: Secondary | ICD-10-CM | POA: Diagnosis not present

## 2023-04-17 DIAGNOSIS — K921 Melena: Secondary | ICD-10-CM | POA: Diagnosis not present

## 2023-04-17 NOTE — Telephone Encounter (Signed)
Patient had 2 more episodes of Bloody Stools She was getting little tachycardic but then did calm down Her Hgb came back as 10.1 Will repeat HGB again tomorrow Will send to ED if Vitals change She refuses to go due to recurent issues

## 2023-04-18 DIAGNOSIS — K921 Melena: Secondary | ICD-10-CM | POA: Diagnosis not present

## 2023-04-19 ENCOUNTER — Encounter (HOSPITAL_COMMUNITY): Payer: Self-pay

## 2023-04-19 ENCOUNTER — Inpatient Hospital Stay (HOSPITAL_COMMUNITY)
Admission: EM | Admit: 2023-04-19 | Discharge: 2023-04-29 | DRG: 378 | Disposition: A | Discharge status: Skilled Nursing Facility | Payer: Medicare HMO | Source: Skilled Nursing Facility | Attending: Internal Medicine | Admitting: Internal Medicine

## 2023-04-19 ENCOUNTER — Other Ambulatory Visit: Payer: Self-pay

## 2023-04-19 ENCOUNTER — Emergency Department (HOSPITAL_COMMUNITY): Payer: Medicare HMO

## 2023-04-19 DIAGNOSIS — D539 Nutritional anemia, unspecified: Secondary | ICD-10-CM | POA: Diagnosis not present

## 2023-04-19 DIAGNOSIS — Z66 Do not resuscitate: Secondary | ICD-10-CM | POA: Diagnosis present

## 2023-04-19 DIAGNOSIS — D6959 Other secondary thrombocytopenia: Secondary | ICD-10-CM | POA: Diagnosis present

## 2023-04-19 DIAGNOSIS — Z961 Presence of intraocular lens: Secondary | ICD-10-CM | POA: Diagnosis present

## 2023-04-19 DIAGNOSIS — J410 Simple chronic bronchitis: Secondary | ICD-10-CM | POA: Diagnosis not present

## 2023-04-19 DIAGNOSIS — Z7983 Long term (current) use of bisphosphonates: Secondary | ICD-10-CM

## 2023-04-19 DIAGNOSIS — Z888 Allergy status to other drugs, medicaments and biological substances status: Secondary | ICD-10-CM

## 2023-04-19 DIAGNOSIS — Z9181 History of falling: Secondary | ICD-10-CM | POA: Diagnosis not present

## 2023-04-19 DIAGNOSIS — Z79899 Other long term (current) drug therapy: Secondary | ICD-10-CM | POA: Diagnosis not present

## 2023-04-19 DIAGNOSIS — F418 Other specified anxiety disorders: Secondary | ICD-10-CM | POA: Diagnosis not present

## 2023-04-19 DIAGNOSIS — I5032 Chronic diastolic (congestive) heart failure: Secondary | ICD-10-CM | POA: Diagnosis present

## 2023-04-19 DIAGNOSIS — K8020 Calculus of gallbladder without cholecystitis without obstruction: Secondary | ICD-10-CM | POA: Diagnosis present

## 2023-04-19 DIAGNOSIS — K52831 Collagenous colitis: Secondary | ICD-10-CM | POA: Diagnosis present

## 2023-04-19 DIAGNOSIS — K640 First degree hemorrhoids: Secondary | ICD-10-CM | POA: Diagnosis present

## 2023-04-19 DIAGNOSIS — K922 Gastrointestinal hemorrhage, unspecified: Principal | ICD-10-CM | POA: Diagnosis present

## 2023-04-19 DIAGNOSIS — Z885 Allergy status to narcotic agent status: Secondary | ICD-10-CM

## 2023-04-19 DIAGNOSIS — K6389 Other specified diseases of intestine: Secondary | ICD-10-CM | POA: Diagnosis not present

## 2023-04-19 DIAGNOSIS — E785 Hyperlipidemia, unspecified: Secondary | ICD-10-CM | POA: Diagnosis present

## 2023-04-19 DIAGNOSIS — Z6834 Body mass index (BMI) 34.0-34.9, adult: Secondary | ICD-10-CM

## 2023-04-19 DIAGNOSIS — F32A Depression, unspecified: Secondary | ICD-10-CM | POA: Diagnosis present

## 2023-04-19 DIAGNOSIS — H3530 Unspecified macular degeneration: Secondary | ICD-10-CM | POA: Diagnosis present

## 2023-04-19 DIAGNOSIS — F419 Anxiety disorder, unspecified: Secondary | ICD-10-CM | POA: Diagnosis present

## 2023-04-19 DIAGNOSIS — K5730 Diverticulosis of large intestine without perforation or abscess without bleeding: Secondary | ICD-10-CM | POA: Diagnosis not present

## 2023-04-19 DIAGNOSIS — D62 Acute posthemorrhagic anemia: Secondary | ICD-10-CM | POA: Diagnosis present

## 2023-04-19 DIAGNOSIS — I110 Hypertensive heart disease with heart failure: Secondary | ICD-10-CM | POA: Diagnosis present

## 2023-04-19 DIAGNOSIS — Z8249 Family history of ischemic heart disease and other diseases of the circulatory system: Secondary | ICD-10-CM | POA: Diagnosis not present

## 2023-04-19 DIAGNOSIS — R911 Solitary pulmonary nodule: Secondary | ICD-10-CM | POA: Diagnosis present

## 2023-04-19 DIAGNOSIS — H409 Unspecified glaucoma: Secondary | ICD-10-CM | POA: Diagnosis present

## 2023-04-19 DIAGNOSIS — K633 Ulcer of intestine: Secondary | ICD-10-CM | POA: Diagnosis not present

## 2023-04-19 DIAGNOSIS — G2581 Restless legs syndrome: Secondary | ICD-10-CM | POA: Diagnosis present

## 2023-04-19 DIAGNOSIS — J449 Chronic obstructive pulmonary disease, unspecified: Secondary | ICD-10-CM | POA: Diagnosis present

## 2023-04-19 DIAGNOSIS — D649 Anemia, unspecified: Secondary | ICD-10-CM | POA: Diagnosis not present

## 2023-04-19 DIAGNOSIS — K219 Gastro-esophageal reflux disease without esophagitis: Secondary | ICD-10-CM | POA: Diagnosis not present

## 2023-04-19 DIAGNOSIS — I701 Atherosclerosis of renal artery: Secondary | ICD-10-CM | POA: Diagnosis not present

## 2023-04-19 DIAGNOSIS — Z881 Allergy status to other antibiotic agents status: Secondary | ICD-10-CM

## 2023-04-19 DIAGNOSIS — Z743 Need for continuous supervision: Secondary | ICD-10-CM | POA: Diagnosis not present

## 2023-04-19 DIAGNOSIS — K5731 Diverticulosis of large intestine without perforation or abscess with bleeding: Secondary | ICD-10-CM | POA: Diagnosis not present

## 2023-04-19 DIAGNOSIS — Z96653 Presence of artificial knee joint, bilateral: Secondary | ICD-10-CM | POA: Diagnosis present

## 2023-04-19 DIAGNOSIS — K2100 Gastro-esophageal reflux disease with esophagitis, without bleeding: Secondary | ICD-10-CM | POA: Diagnosis not present

## 2023-04-19 DIAGNOSIS — I5030 Unspecified diastolic (congestive) heart failure: Secondary | ICD-10-CM | POA: Diagnosis not present

## 2023-04-19 DIAGNOSIS — D500 Iron deficiency anemia secondary to blood loss (chronic): Secondary | ICD-10-CM | POA: Diagnosis not present

## 2023-04-19 DIAGNOSIS — Z9842 Cataract extraction status, left eye: Secondary | ICD-10-CM

## 2023-04-19 DIAGNOSIS — N2889 Other specified disorders of kidney and ureter: Secondary | ICD-10-CM | POA: Diagnosis not present

## 2023-04-19 DIAGNOSIS — R58 Hemorrhage, not elsewhere classified: Secondary | ICD-10-CM | POA: Diagnosis not present

## 2023-04-19 DIAGNOSIS — Z87891 Personal history of nicotine dependence: Secondary | ICD-10-CM

## 2023-04-19 DIAGNOSIS — E66811 Obesity, class 1: Secondary | ICD-10-CM

## 2023-04-19 DIAGNOSIS — K317 Polyp of stomach and duodenum: Secondary | ICD-10-CM | POA: Diagnosis not present

## 2023-04-19 DIAGNOSIS — E6609 Other obesity due to excess calories: Secondary | ICD-10-CM | POA: Diagnosis not present

## 2023-04-19 DIAGNOSIS — D122 Benign neoplasm of ascending colon: Secondary | ICD-10-CM | POA: Diagnosis not present

## 2023-04-19 DIAGNOSIS — K635 Polyp of colon: Secondary | ICD-10-CM | POA: Diagnosis not present

## 2023-04-19 DIAGNOSIS — Z886 Allergy status to analgesic agent status: Secondary | ICD-10-CM

## 2023-04-19 DIAGNOSIS — Z7401 Bed confinement status: Secondary | ICD-10-CM | POA: Diagnosis not present

## 2023-04-19 DIAGNOSIS — K529 Noninfective gastroenteritis and colitis, unspecified: Secondary | ICD-10-CM | POA: Diagnosis not present

## 2023-04-19 DIAGNOSIS — K2900 Acute gastritis without bleeding: Secondary | ICD-10-CM | POA: Diagnosis present

## 2023-04-19 DIAGNOSIS — M6281 Muscle weakness (generalized): Secondary | ICD-10-CM | POA: Diagnosis not present

## 2023-04-19 DIAGNOSIS — I10 Essential (primary) hypertension: Secondary | ICD-10-CM | POA: Diagnosis present

## 2023-04-19 DIAGNOSIS — R5381 Other malaise: Secondary | ICD-10-CM | POA: Diagnosis present

## 2023-04-19 DIAGNOSIS — M810 Age-related osteoporosis without current pathological fracture: Secondary | ICD-10-CM | POA: Diagnosis present

## 2023-04-19 DIAGNOSIS — D509 Iron deficiency anemia, unspecified: Secondary | ICD-10-CM | POA: Diagnosis present

## 2023-04-19 DIAGNOSIS — Z23 Encounter for immunization: Secondary | ICD-10-CM

## 2023-04-19 DIAGNOSIS — K921 Melena: Secondary | ICD-10-CM | POA: Diagnosis not present

## 2023-04-19 DIAGNOSIS — K649 Unspecified hemorrhoids: Secondary | ICD-10-CM | POA: Diagnosis not present

## 2023-04-19 DIAGNOSIS — Z85828 Personal history of other malignant neoplasm of skin: Secondary | ICD-10-CM | POA: Diagnosis not present

## 2023-04-19 DIAGNOSIS — R2681 Unsteadiness on feet: Secondary | ICD-10-CM | POA: Diagnosis not present

## 2023-04-19 DIAGNOSIS — M1990 Unspecified osteoarthritis, unspecified site: Secondary | ICD-10-CM | POA: Diagnosis present

## 2023-04-19 DIAGNOSIS — E876 Hypokalemia: Secondary | ICD-10-CM | POA: Diagnosis not present

## 2023-04-19 DIAGNOSIS — I959 Hypotension, unspecified: Secondary | ICD-10-CM | POA: Diagnosis not present

## 2023-04-19 DIAGNOSIS — R531 Weakness: Secondary | ICD-10-CM | POA: Diagnosis not present

## 2023-04-19 DIAGNOSIS — I700 Atherosclerosis of aorta: Secondary | ICD-10-CM | POA: Diagnosis not present

## 2023-04-19 DIAGNOSIS — Z9841 Cataract extraction status, right eye: Secondary | ICD-10-CM

## 2023-04-19 LAB — POC OCCULT BLOOD, ED: Fecal Occult Bld: POSITIVE — AB

## 2023-04-19 LAB — CBC WITH DIFFERENTIAL/PLATELET
Abs Immature Granulocytes: 0.03 10*3/uL (ref 0.00–0.07)
Basophils Absolute: 0 10*3/uL (ref 0.0–0.1)
Basophils Relative: 0 %
Eosinophils Absolute: 0.1 10*3/uL (ref 0.0–0.5)
Eosinophils Relative: 1 %
HCT: 19.2 % — ABNORMAL LOW (ref 36.0–46.0)
Hemoglobin: 5.9 g/dL — CL (ref 12.0–15.0)
Immature Granulocytes: 0 %
Lymphocytes Relative: 20 %
Lymphs Abs: 1.6 10*3/uL (ref 0.7–4.0)
MCH: 31.9 pg (ref 26.0–34.0)
MCHC: 30.7 g/dL (ref 30.0–36.0)
MCV: 103.8 fL — ABNORMAL HIGH (ref 80.0–100.0)
Monocytes Absolute: 0.9 10*3/uL (ref 0.1–1.0)
Monocytes Relative: 12 %
Neutro Abs: 5.3 10*3/uL (ref 1.7–7.7)
Neutrophils Relative %: 67 %
Platelets: 154 10*3/uL (ref 150–400)
RBC: 1.85 MIL/uL — ABNORMAL LOW (ref 3.87–5.11)
RDW: 15.9 % — ABNORMAL HIGH (ref 11.5–15.5)
WBC: 7.9 10*3/uL (ref 4.0–10.5)
nRBC: 0.3 % — ABNORMAL HIGH (ref 0.0–0.2)

## 2023-04-19 LAB — COMPREHENSIVE METABOLIC PANEL
ALT: 9 U/L (ref 0–44)
AST: 16 U/L (ref 15–41)
Albumin: 3.4 g/dL — ABNORMAL LOW (ref 3.5–5.0)
Alkaline Phosphatase: 31 U/L — ABNORMAL LOW (ref 38–126)
Anion gap: 8 (ref 5–15)
BUN: 24 mg/dL — ABNORMAL HIGH (ref 8–23)
CO2: 25 mmol/L (ref 22–32)
Calcium: 8.5 mg/dL — ABNORMAL LOW (ref 8.9–10.3)
Chloride: 106 mmol/L (ref 98–111)
Creatinine, Ser: 0.49 mg/dL (ref 0.44–1.00)
GFR, Estimated: >60 mL/min (ref >60)
Glucose, Bld: 128 mg/dL — ABNORMAL HIGH (ref 70–99)
Potassium: 3.5 mmol/L (ref 3.5–5.1)
Sodium: 139 mmol/L (ref 135–145)
Total Bilirubin: 0.6 mg/dL (ref 0.3–1.2)
Total Protein: 5.8 g/dL — ABNORMAL LOW (ref 6.5–8.1)

## 2023-04-19 LAB — HEMOGLOBIN AND HEMATOCRIT, BLOOD
HCT: 21.9 % — ABNORMAL LOW (ref 36.0–46.0)
Hemoglobin: 6.7 g/dL — CL (ref 12.0–15.0)

## 2023-04-19 LAB — PROTIME-INR
INR: 1.3 — ABNORMAL HIGH (ref 0.8–1.2)
Prothrombin Time: 16.2 s — ABNORMAL HIGH (ref 11.4–15.2)

## 2023-04-19 LAB — I-STAT CHEM 8, ED
BUN: 22 mg/dL (ref 8–23)
Calcium, Ion: 1.17 mmol/L (ref 1.15–1.40)
Chloride: 104 mmol/L (ref 98–111)
Creatinine, Ser: 0.6 mg/dL (ref 0.44–1.00)
Glucose, Bld: 124 mg/dL — ABNORMAL HIGH (ref 70–99)
HCT: 18 % — ABNORMAL LOW (ref 36.0–46.0)
Hemoglobin: 6.1 g/dL — CL (ref 12.0–15.0)
Potassium: 3.6 mmol/L (ref 3.5–5.1)
Sodium: 141 mmol/L (ref 135–145)
TCO2: 24 mmol/L (ref 22–32)

## 2023-04-19 LAB — PREPARE RBC (CROSSMATCH)

## 2023-04-19 LAB — MRSA NEXT GEN BY PCR, NASAL: MRSA by PCR Next Gen: NOT DETECTED

## 2023-04-19 MED ORDER — ACETAMINOPHEN 650 MG RE SUPP: 650.0000 mg | Freq: Four times a day (QID) | RECTAL | Status: DC | PRN

## 2023-04-19 MED ORDER — SODIUM CHLORIDE 0.9 % IV BOLUS
500.0000 mL | Freq: Once | INTRAVENOUS | Status: AC
  Administered 2023-04-19: 500 mL via INTRAVENOUS

## 2023-04-19 MED ORDER — CHLORHEXIDINE GLUCONATE CLOTH 2 % EX PADS
6.0000 | MEDICATED_PAD | Freq: Every day | CUTANEOUS | Status: DC
Start: 2023-04-19 — End: 2023-04-19

## 2023-04-19 MED ORDER — SIMVASTATIN 20 MG PO TABS
20.0000 mg | ORAL_TABLET | Freq: Every evening | ORAL | Status: DC
  Administered 2023-04-19 – 2023-04-28 (×10): 20 mg via ORAL
  Filled 2023-04-19: qty 1
  Filled 2023-04-19: qty 2
  Filled 2023-04-19 (×2): qty 1
  Filled 2023-04-19 (×3): qty 2
  Filled 2023-04-19: qty 1
  Filled 2023-04-19 (×3): qty 2

## 2023-04-19 MED ORDER — DORZOLAMIDE HCL-TIMOLOL MAL 2-0.5 % OP SOLN
1.0000 [drp] | Freq: Two times a day (BID) | OPHTHALMIC | Status: DC
  Administered 2023-04-19 – 2023-04-29 (×16): 1 [drp] via OPHTHALMIC
  Filled 2023-04-19: qty 10

## 2023-04-19 MED ORDER — NETARSUDIL DIMESYLATE 0.02 % OP SOLN
1.0000 [drp] | Freq: Every day | OPHTHALMIC | Status: DC
Start: 2023-04-19 — End: 2023-04-25

## 2023-04-19 MED ORDER — LATANOPROST 0.005 % OP SOLN
1.0000 [drp] | Freq: Every day | OPHTHALMIC | Status: DC
  Administered 2023-04-19 – 2023-04-28 (×10): 1 [drp] via OPHTHALMIC
  Filled 2023-04-19: qty 2.5

## 2023-04-19 MED ORDER — PANTOPRAZOLE SODIUM 40 MG IV SOLR
40.0000 mg | Freq: Two times a day (BID) | INTRAVENOUS | Status: DC
  Administered 2023-04-19 – 2023-04-26 (×16): 40 mg via INTRAVENOUS
  Filled 2023-04-19 (×17): qty 10

## 2023-04-19 MED ORDER — CHLORHEXIDINE GLUCONATE CLOTH 2 % EX PADS
6.0000 | MEDICATED_PAD | Freq: Every day | CUTANEOUS | Status: DC
  Administered 2023-04-20 – 2023-04-27 (×7): 6 via TOPICAL

## 2023-04-19 MED ORDER — SODIUM CHLORIDE 0.9% IV SOLUTION: Freq: Once | INTRAVENOUS | Status: AC

## 2023-04-19 MED ORDER — OXYBUTYNIN CHLORIDE ER 5 MG PO TB24
5.0000 mg | ORAL_TABLET | Freq: Every day | ORAL | Status: DC
  Administered 2023-04-19 – 2023-04-28 (×10): 5 mg via ORAL
  Filled 2023-04-19 (×10): qty 1

## 2023-04-19 MED ORDER — ACETAMINOPHEN 325 MG PO TABS
650.0000 mg | ORAL_TABLET | Freq: Four times a day (QID) | ORAL | Status: DC | PRN
  Administered 2023-04-19 – 2023-04-24 (×4): 650 mg via ORAL
  Filled 2023-04-19 (×4): qty 2

## 2023-04-19 MED ORDER — IOHEXOL 350 MG/ML SOLN
100.0000 mL | Freq: Once | INTRAVENOUS | Status: AC | PRN
  Administered 2023-04-19: 100 mL via INTRAVENOUS

## 2023-04-19 MED ORDER — UMECLIDINIUM-VILANTEROL 62.5-25 MCG/ACT IN AEPB
1.0000 | INHALATION_SPRAY | Freq: Every morning | RESPIRATORY_TRACT | Status: DC
  Administered 2023-04-20 – 2023-04-28 (×9): 1 via RESPIRATORY_TRACT
  Filled 2023-04-19 (×3): qty 14

## 2023-04-19 MED ORDER — SERTRALINE HCL 50 MG PO TABS
75.0000 mg | ORAL_TABLET | Freq: Every day | ORAL | Status: DC
  Administered 2023-04-19 – 2023-04-29 (×9): 75 mg via ORAL
  Filled 2023-04-19: qty 1
  Filled 2023-04-19 (×3): qty 2
  Filled 2023-04-19: qty 1
  Filled 2023-04-19: qty 2
  Filled 2023-04-19: qty 1
  Filled 2023-04-19 (×2): qty 2
  Filled 2023-04-19: qty 1

## 2023-04-19 MED ORDER — PROCHLORPERAZINE EDISYLATE 10 MG/2ML IJ SOLN: 5.0000 mg | INTRAMUSCULAR | Status: DC | PRN

## 2023-04-19 NOTE — Progress Notes (Signed)
Patient Name: [name]           DOB: May 08, 1941  MRN: [id number]      Admission Date: 04/19/2023  Attending Provider: Bobette Mo, MD  Primary Diagnosis: UGI bleed   Level of care: Stepdown    CROSS COVER NOTE   Date of Service   04/19/2023   [name], 82 y.o. female, was admitted on 04/19/2023 for UGI bleed.    HPI/Events of Note   Acute Blood Loss Anemia GI bleed Hemoglobin 6.1 -->  6.7 after you 2 units PRBC No acute changes reported.  Hemodynamically stable.   Interventions/ Plan   Blood transfusion, 1 unit PRBC Recheck H&H after transfusion is completed, transfuse if HGB <7        Anthoney Harada, DNP, [employer] [facility] [place]

## 2023-04-19 NOTE — ED Notes (Signed)
Patient transported to CT 

## 2023-04-19 NOTE — ED Notes (Signed)
ED TO INPATIENT HANDOFF REPORT  ED Nurse Name and Phone #: [name], RN [phone]  S Name/Age/Gender [name] 82 y.o. female Room/Bed: WA14/WA14  Code Status   Code Status: Do not attempt resuscitation (DNR) PRE-ARREST INTERVENTIONS DESIRED  Home/SNF/Other Skilled nursing facility Patient oriented to: self, place, time, and situation Is this baseline? Yes   Triage Complete: Triage complete  Chief Complaint UGI bleed [K92.2]  Triage Note Pt BIB by [facility] from [facility], c/o blood in stool for 2 1/2 days. Has hx of GI Bleeds. Had lab work done yesterday and hgb was low. Denies shob, pain, or weakness. VSS, NAD.  BP 122/50, HR 88, Spo2 96 RA, RR 16, CBG 137   Allergies Allergies  Allergen Reactions   Morphine And Codeine Itching   Aspirin Other (See Comments)    Unknown reaction   Duloxetine Hcl Other (See Comments)    Unknown reaction   Tymlos [Abaloparatide] Other (See Comments)    Unknown reaction   Ventolin [Albuterol] Other (See Comments)    rapid heart beat   Cefadroxil Hives    Patient can take amoxicillin and cipro    Level of Care/Admitting Diagnosis ED Disposition     ED Disposition  Admit   Condition  --   Comment  Hospital [facility] [[id number]] Level of Care: Stepdown [14] Admit to SDU based on following criteria: Hemodynamic compromise or significant risk of instability:  Patient requiring short term acute titration and management of  vasoactive drips, and invasive monitoring (i.e., CVP and Arterial line). May admit patient to [facility] or [facility] if equivalent level of care is available:: No Covid Evaluation: Asymptomatic - no recent exposure (last 10 days) testing not re quired Diagnosis: UGI bleed [267063] Admitting Physician: Bobette Mo [[id number]] Attending Physician: Bobette Mo [[id number]] Certification:: I certify this patient will need inpatient services for at least 2 midnights Expected Medi  cal Readiness: 04/21/2023          B Medical/Surgery History Past Medical History:  Diagnosis Date   Anxiety    Arthritis    "bilateral knees, shoulders, elbows; neck, pretty widespread" (09/05/2017)   BPPV (benign paroxysmal positional vertigo)    Depression    GERD (gastroesophageal reflux disease)    Glaucoma, both eyes    Headache    "probably 2/month" (09/05/2017)   History of blood transfusion ~ 2008   "related to LGIB"   Hyperlipemia    Lower GI bleeding ~ 2008; 09/05/2017   "had to have blood transfusion"   Macular degeneration, bilateral    Osteopenia    Seasonal allergies    Skin cancer, basal cell 2001   "off my nose, left side"   Sleeping excessive    Tinnitus of both ears    Past Surgical History:  Procedure Laterality Date   BALLOON DILATION N/A 05/08/2021   Procedure: BALLOON DILATION;  Surgeon: Kerin Salen, MD;  Location: [facility] ENDOSCOPY;  Service: Gastroenterology;  Laterality: N/A;   BASAL CELL CARCINOMA EXCISION  2001   "off my nose, left side"   BIOPSY  05/08/2021   Procedure: BIOPSY;  Surgeon: Kerin Salen, MD;  Location: [facility] ENDOSCOPY;  Service: Gastroenterology;;   BLEPHAROPLASTY Bilateral    CATARACT EXTRACTION W/ INTRAOCULAR LENS  IMPLANT, BILATERAL Bilateral 1990's   COLONOSCOPY WITH PROPOFOL N/A 05/04/2022   Procedure: COLONOSCOPY WITH PROPOFOL;  Surgeon: Kerin Salen, MD;  Location: [facility] ENDOSCOPY;  Service: Gastroenterology;  Laterality: N/A;   ESOPHAGOGASTRODUODENOSCOPY (EGD) WITH  PROPOFOL N/A 05/08/2021   Procedure: ESOPHAGOGASTRODUODENOSCOPY (EGD) WITH PROPOFOL;  Surgeon: Kerin Salen, MD;  Location: [facility] ENDOSCOPY;  Service: Gastroenterology;  Laterality: N/A;   ESOPHAGOGASTRODUODENOSCOPY (EGD) WITH PROPOFOL N/A 01/11/2022   Procedure: ESOPHAGOGASTRODUODENOSCOPY (EGD) WITH PROPOFOL;  Surgeon: Vida Rigger, MD;  Location: [facility] ENDOSCOPY;  Service: Gastroenterology;  Laterality: N/A;   ESOPHAGOGASTRODUODENOSCOPY (EGD) WITH PROPOFOL N/A 01/06/2023   Procedure:  ESOPHAGOGASTRODUODENOSCOPY (EGD) WITH PROPOFOL;  Surgeon: Vida Rigger, MD;  Location: [facility] ENDOSCOPY;  Service: Gastroenterology;  Laterality: N/A;   EYE SURGERY Bilateral    "to improve vision after cataract OR"   IR ANGIOGRAM FOLLOW UP STUDY  12/19/2020   IR ANGIOGRAM SELECTIVE EACH ADDITIONAL VESSEL  12/19/2020   IR ANGIOGRAM SELECTIVE EACH ADDITIONAL VESSEL  12/19/2020   IR ANGIOGRAM VISCERAL SELECTIVE  12/19/2020   IR EMBO ART  VEN HEMORR LYMPH EXTRAV  INC GUIDE ROADMAPPING  12/19/2020   IR US GUIDE VASC ACCESS RIGHT  12/19/2020   JOINT REPLACEMENT     POLYPECTOMY  05/04/2022   Procedure: POLYPECTOMY;  Surgeon: Kerin Salen, MD;  Location: [facility] ENDOSCOPY;  Service: Gastroenterology;;   STAPEDES SURGERY Left    "scraped stapedes because it was sticking when it wasn't suppose to"   TONSILLECTOMY AND ADENOIDECTOMY  1946   TOTAL KNEE ARTHROPLASTY Left ~ 2008   TOTAL KNEE ARTHROPLASTY  12/20/2011   Procedure: TOTAL KNEE ARTHROPLASTY;  Surgeon: Verlee Rossetti, MD;  Location: [facility] OR;  Service: Orthopedics;  Laterality: Right;  Right Total Knee Arthroplasty   TUBAL LIGATION  1980's     A IV Location/Drains/Wounds Patient Lines/Drains/Airways Status     Active Line/Drains/Airways     Name Placement date Placement time Site Days   Peripheral IV 04/19/23 20 G 1" Right Antecubital 04/19/23  1219  Antecubital  less than 1   Peripheral IV 04/19/23 20 G Left Antecubital 04/19/23  1300  Antecubital  less than 1            Intake/Output Last 24 hours  Intake/Output Summary (Last 24 hours) at 04/19/2023 1740 Last data filed at 04/19/2023 1413 Gross per 24 hour  Intake 500 ml  Output --  Net 500 ml    Labs/Imaging Results for orders placed or performed during the hospital encounter of 04/19/23 (from the past 48 hour(s))  Comprehensive metabolic panel     Status: Abnormal   Collection Time: 04/19/23 11:44 AM  Result Value Ref Range   Sodium 139 135 - 145 mmol/L   Potassium 3.5 3.5 - 5.1  mmol/L   Chloride 106 98 - 111 mmol/L   CO2 25 22 - 32 mmol/L   Glucose, Bld 128 (H) 70 - 99 mg/dL    Comment: Glucose reference range applies only to samples taken after fasting for at least 8 hours.   BUN 24 (H) 8 - 23 mg/dL   Creatinine, Ser 8.65 0.44 - 1.00 mg/dL   Calcium 8.5 (L) 8.9 - 10.3 mg/dL   Total Protein 5.8 (L) 6.5 - 8.1 g/dL   Albumin 3.4 (L) 3.5 - 5.0 g/dL   AST 16 15 - 41 U/L   ALT 9 0 - 44 U/L   Alkaline Phosphatase 31 (L) 38 - 126 U/L   Total Bilirubin 0.6 0.3 - 1.2 mg/dL   GFR, Estimated >78 >46 mL/min    Comment: (NOTE) Calculated using the CKD-EPI Creatinine Equation (2021)    Anion gap 8 5 - 15    Comment: Performed at [facility], [address]  [place] [place], [place] [address]  CBC with Differential     Status: Abnormal   Collection Time: 04/19/23 11:44 AM  Result Value Ref Range   WBC 7.9 4.0 - 10.5 K/uL   RBC 1.85 (L) 3.87 - 5.11 MIL/uL   Hemoglobin 5.9 (LL) 12.0 - 15.0 g/dL    Comment: This critical result has verified and been called to SHAW,S. RN by Patrina Levering on 10 19 2024 at 1240, and has been read back.    HCT 19.2 (L) 36.0 - 46.0 %   MCV 103.8 (H) 80.0 - 100.0 fL   MCH 31.9 26.0 - 34.0 pg   MCHC 30.7 30.0 - 36.0 g/dL   RDW 86.5 (H) 78.4 - 69.6 %   Platelets 154 150 - 400 K/uL   nRBC 0.3 (H) 0.0 - 0.2 %   Neutrophils Relative % 67 %   Neutro Abs 5.3 1.7 - 7.7 K/uL   Lymphocytes Relative 20 %   Lymphs Abs 1.6 0.7 - 4.0 K/uL   Monocytes Relative 12 %   Monocytes Absolute 0.9 0.1 - 1.0 K/uL   Eosinophils Relative 1 %   Eosinophils Absolute 0.1 0.0 - 0.5 K/uL   Basophils Relative 0 %   Basophils Absolute 0.0 0.0 - 0.1 K/uL   Immature Granulocytes 0 %   Abs Immature Granulocytes 0.03 0.00 - 0.07 K/uL    Comment: Performed at [facility], [address], [place], [place] [address]  Protime-INR     Status: Abnormal   Collection Time: 04/19/23 11:44 AM  Result Value Ref Range   Prothrombin Time 16.2 (H)  11.4 - 15.2 seconds   INR 1.3 (H) 0.8 - 1.2    Comment: (NOTE) INR goal varies based on device and disease states. Performed at [facility], [address], [place], [place] [address]   Type and screen [facility]     Status: None (Preliminary result)   Collection Time: 04/19/23 11:44 AM  Result Value Ref Range   ABO/RH(D) O POS    Antibody Screen NEG    Sample Expiration 04/22/2023,2359    Unit Number [id number]    Blood Component Type RED CELLS,LR    Unit division 00    Status of Unit ISSUED    Transfusion Status OK TO TRANSFUSE    Crossmatch Result      Compatible Performed at [facility], [address] [place], [place] [address]    Unit Number [id number]    Blood Component Type RED CELLS,LR    Unit division 00    Status of Unit ALLOCATED    Transfusion Status OK TO TRANSFUSE    Crossmatch Result Compatible   I-stat chem 8, ED (not at [facility], [facility] or [facility])     Status: Abnormal   Collection Time: 04/19/23 12:27 PM  Result Value Ref Range   Sodium 141 135 - 145 mmol/L   Potassium 3.6 3.5 - 5.1 mmol/L   Chloride 104 98 - 111 mmol/L   BUN 22 8 - 23 mg/dL   Creatinine, Ser 2.95 0.44 - 1.00 mg/dL   Glucose, Bld 188 (H) 70 - 99 mg/dL    Comment: Glucose reference range applies only to samples taken after fasting for at least 8 hours.   Calcium, Ion 1.17 1.15 - 1.40 mmol/L   TCO2 24 22 - 32 mmol/L   Hemoglobin 6.1 (LL) 12.0 - 15.0 g/dL   HCT 41.6 (L) 60.6 - 30.1 %  Comment NOTIFIED PHYSICIAN   Prepare RBC (crossmatch)     Status: None   Collection Time: 04/19/23 12:45 PM  Result Value Ref Range   Order Confirmation      ORDER PROCESSED BY BLOOD BANK Performed at [facility], [address], [place], [place] [address]   POC occult blood, ED     Status: Abnormal   Collection Time: 04/19/23  1:19 PM  Result Value Ref Range   Fecal Occult Bld POSITIVE (A) NEGATIVE  Prepare RBC (crossmatch)      Status: None   Collection Time: 04/19/23  3:00 PM  Result Value Ref Range   Order Confirmation      ORDER PROCESSED BY BLOOD BANK Performed at [facility], [address] [place], [place] [address]    CT ANGIO GI BLEED  Result Date: 04/19/2023 CLINICAL DATA:  GI bleed.  Anemia EXAM: CTA ABDOMEN AND PELVIS WITHOUT AND WITH CONTRAST TECHNIQUE: Multidetector CT imaging of the abdomen and pelvis was performed using the standard protocol during bolus administration of intravenous contrast. Multiplanar reconstructed images and MIPs were obtained and reviewed to evaluate the vascular anatomy. RADIATION DOSE REDUCTION: This exam was performed according to the departmental dose-optimization program which includes automated exposure control, adjustment of the mA and/or kV according to patient size and/or use of iterative reconstruction technique. CONTRAST:  OMNIPAQUE IOHEXOL 350 MG/ML SOLN COMPARISON:  05/03/2022 FINDINGS: VASCULAR Aorta: Normal caliber aorta without aneurysm, dissection, vasculitis or significant stenosis. Moderate atherosclerosis. Celiac: Patent without evidence of aneurysm, dissection, vasculitis or significant stenosis. SMA: Patent without evidence of aneurysm, dissection, vasculitis or significant stenosis. Renals: Atherosclerotic plaque at the ostia of the bilateral renal arteries demonstrates right greater than left proximal stenosis, similar to prior. No aneurysm or evidence of fibromuscular dysplasia. IMA: Patent. Inflow: Patent without evidence of aneurysm, dissection, vasculitis or significant stenosis. Proximal Outflow: Bilateral common femoral and visualized portions of the superficial and profunda femoral arteries are patent without evidence of aneurysm, dissection, vasculitis or significant stenosis. Veins: Major venous structures of the abdomen are patent. Review of the MIP images confirms the above findings. NON-VASCULAR Lower chest: No acute  abnormality. Hepatobiliary: No focal liver abnormality. Small amount of layering sludge or small stones within the gallbladder. No gallbladder wall thickening or pericholecystic inflammatory changes by CT. No biliary dilatation. Pancreas: Unremarkable. No pancreatic ductal dilatation or surrounding inflammatory changes. Spleen: Normal in size without focal abnormality. Adrenals/Urinary Tract: Unremarkable adrenal glands. Unchanged left caliceal diverticulum. Kidneys enhance symmetrically. No stone or hydronephrosis. Urinary bladder within normal limits. Stomach/Bowel: Stomach within normal limits. No abnormally dilated loops of bowel. Normal appendix in the right lower quadrant. Severe colonic diverticulosis. No focal bowel wall thickening or inflammatory changes. No contrast accumulation within the bowel lumen is identified on multi phasic imaging to suggest active GI bleed by CT. Lymphatic: No abdominopelvic lymphadenopathy. Reproductive: Uterus and bilateral adnexa are unremarkable. Other: No free air or free fluid. 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 No acute bony abnormalities. IMPRESSION: 1. No evidence of active GI bleed by CT. If further imaging evaluation is clinically warranted, a nuclear medicine GI bleeding scan could be performed. 2. Severe colonic diverticulosis without evidence of acute diverticulitis. 3. Cholelithiasis without evidence of acute cholecystitis. 4. 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 5. Aortic atherosclerosis (ICD10-I70.0). Electronically Signed   By: Duanne Guess D.O.   On: 04/19/2023 15:46    Pending Labs [employer] (  From admission, onward)     Start     Ordered   04/20/23 0500  CBC  Tomorrow morning,   R        04/19/23 1443   04/20/23 0500  Comprehensive metabolic panel  Tomorrow morning,   R        04/19/23 1443   04/19/23 2000  Hemoglobin and hematocrit, blood   Once-Timed,   TIMED        04/19/23 1443            Vitals/Pain Today's Vitals   04/19/23 1230 04/19/23 1522 04/19/23 1624 04/19/23 1641  BP: (!) 120/56  (!) 112/49 (!) 113/53  Pulse: (!) 107  98 (!) 102  Resp: 18  18 18   Temp:  (!) 97.5 F (36.4 C) 98.5 F (36.9 C) 98.6 F (37 C)  TempSrc:  Oral Oral Oral  SpO2: 91%   96%  Weight:      Height:      PainSc:        Isolation Precautions No active isolations  Medications Medications  pantoprazole (PROTONIX) injection 40 mg (40 mg Intravenous Given 04/19/23 1455)  acetaminophen (TYLENOL) tablet 650 mg (has no administration in time range)    Or  acetaminophen (TYLENOL) suppository 650 mg (has no administration in time range)  prochlorperazine (COMPAZINE) injection 5 mg (has no administration in time range)  sodium chloride 0.9 % bolus 500 mL (0 mLs Intravenous Stopped 04/19/23 1413)  iohexol (OMNIPAQUE) 350 MG/ML injection 100 mL (100 mLs Intravenous Contrast Given 04/19/23 1419)    Mobility walks with device     Focused Assessments Neuro Assessment Handoff:  Swallow screen pass?  N/A         Neuro Assessment:   Neuro Checks:      Has TPA been given? No If patient is a Neuro Trauma and patient is going to OR before floor call report to 4N Charge nurse: [phone] or [phone]   R Recommendations: See Admitting Provider Note  Report given to:   Additional Notes:

## 2023-04-19 NOTE — H&P (Signed)
History and Physical    Patient: [name] ZOX:[id number] DOB: Jun 29, 1941 DOA: 04/19/2023 DOS: the patient was seen and examined on 04/19/2023 PCP: Betty Heir, NP  Patient coming from: SNF  Chief Complaint: No chief complaint on file.  HPI: [name] is a 82 y.o. female with medical history significant of anxiety, depression, chronic diastolic heart failure, osteoarthritis, glaucoma, hyperlipidemia, hypertension, pulmonary nodule, restless leg syndrome, osteoporosis, COPD, aortic arthrosclerosis, GERD, history of diverticular bleed with embolization, history of UGI bleed, chronic iron deficiency anemia due to chronic blood loss with multiple admissions due to GI bleed most recently seen from 03/27/2023 until 03/29/2023 with recent EGD in August 2024 that showed gastritis who presented to the emergency department from her facility due to low hemoglobin level associated with several melena episodes.  She denied fever, chills, rhinorrhea, sore throat, wheezing or hemoptysis.  No chest pain, palpitations, diaphoresis, PND, orthopnea or pitting edema of the lower extremities.  No abdominal pain, nausea, emesis, diarrhea, constipation, melena or hematochezia.  No flank pain, dysuria, frequency or hematuria.  No polyuria, polydipsia, polyphagia or blurred vision.   Lab work: CBC 0 white count 7.9, hemoglobin 5.9 g/dL with an MCV of 811.9 fL and platelets 554.  PT 16.2 and INR 1.3.  Fecal occult blood was positive.  CMP showed normal electrolytes after calcium correction, glucose 128, BUN 24 and creatinine 0.49 mg/deciliter.  AST 31 units/L.  Total protein was 5.8 and albumin 3.4 g/dL.  The rest of the LFTs were unremarkable.  Imaging: CTA GI bleed with no evidence of active GI bleed by CT.  If further imaging evaluation required and nuclear medicine GI bleeding scan could be performed.  Cholelithiasis without cholecystitis.  Chronic L1 vertebral body compression fracture with vertebral plana  deformity.  Aortic atherosclerosis.   ED course: Initial vital signs were temperature 98.1 F, pulse 78, respirations 16, BP 111/48 mmHg O2 sat 98% on room air.  The patient received 500 mL normal saline bolus and 2 units of PRBC.  Review of Systems: As mentioned in the history of present illness. All other systems reviewed and are negative. Past Medical History:  Diagnosis Date   Anxiety    Arthritis    "bilateral knees, shoulders, elbows; neck, pretty widespread" (09/05/2017)   BPPV (benign paroxysmal positional vertigo)    Depression    GERD (gastroesophageal reflux disease)    Glaucoma, both eyes    Headache    "probably 2/month" (09/05/2017)   History of blood transfusion ~ 2008   "related to LGIB"   Hyperlipemia    Lower GI bleeding ~ 2008; 09/05/2017   "had to have blood transfusion"   Macular degeneration, bilateral    Osteopenia    Seasonal allergies    Skin cancer, basal cell 2001   "off my nose, left side"   Sleeping excessive    Tinnitus of both ears    Past Surgical History:  Procedure Laterality Date   BALLOON DILATION N/A 05/08/2021   Procedure: BALLOON DILATION;  Surgeon: Kerin Salen, MD;  Location: [facility] ENDOSCOPY;  Service: Gastroenterology;  Laterality: N/A;   BASAL CELL CARCINOMA EXCISION  2001   "off my nose, left side"   BIOPSY  05/08/2021   Procedure: BIOPSY;  Surgeon: Kerin Salen, MD;  Location: [facility] ENDOSCOPY;  Service: Gastroenterology;;   BLEPHAROPLASTY Bilateral    CATARACT EXTRACTION W/ INTRAOCULAR LENS  IMPLANT, BILATERAL Bilateral 1990's   COLONOSCOPY WITH PROPOFOL N/A 05/04/2022   Procedure: COLONOSCOPY WITH PROPOFOL;  Surgeon:  Kerin Salen, MD;  Location: [facility] ENDOSCOPY;  Service: Gastroenterology;  Laterality: N/A;   ESOPHAGOGASTRODUODENOSCOPY (EGD) WITH PROPOFOL N/A 05/08/2021   Procedure: ESOPHAGOGASTRODUODENOSCOPY (EGD) WITH PROPOFOL;  Surgeon: Kerin Salen, MD;  Location: [facility] ENDOSCOPY;  Service: Gastroenterology;  Laterality: N/A;    ESOPHAGOGASTRODUODENOSCOPY (EGD) WITH PROPOFOL N/A 01/11/2022   Procedure: ESOPHAGOGASTRODUODENOSCOPY (EGD) WITH PROPOFOL;  Surgeon: Vida Rigger, MD;  Location: [facility] ENDOSCOPY;  Service: Gastroenterology;  Laterality: N/A;   ESOPHAGOGASTRODUODENOSCOPY (EGD) WITH PROPOFOL N/A 01/06/2023   Procedure: ESOPHAGOGASTRODUODENOSCOPY (EGD) WITH PROPOFOL;  Surgeon: Vida Rigger, MD;  Location: [facility] ENDOSCOPY;  Service: Gastroenterology;  Laterality: N/A;   EYE SURGERY Bilateral    "to improve vision after cataract OR"   IR ANGIOGRAM FOLLOW UP STUDY  12/19/2020   IR ANGIOGRAM SELECTIVE EACH ADDITIONAL VESSEL  12/19/2020   IR ANGIOGRAM SELECTIVE EACH ADDITIONAL VESSEL  12/19/2020   IR ANGIOGRAM VISCERAL SELECTIVE  12/19/2020   IR EMBO ART  VEN HEMORR LYMPH EXTRAV  INC GUIDE ROADMAPPING  12/19/2020   IR US GUIDE VASC ACCESS RIGHT  12/19/2020   JOINT REPLACEMENT     POLYPECTOMY  05/04/2022   Procedure: POLYPECTOMY;  Surgeon: Kerin Salen, MD;  Location: [facility] ENDOSCOPY;  Service: Gastroenterology;;   STAPEDES SURGERY Left    "scraped stapedes because it was sticking when it wasn't suppose to"   TONSILLECTOMY AND ADENOIDECTOMY  1946   TOTAL KNEE ARTHROPLASTY Left ~ 2008   TOTAL KNEE ARTHROPLASTY  12/20/2011   Procedure: TOTAL KNEE ARTHROPLASTY;  Surgeon: Verlee Rossetti, MD;  Location: [facility] OR;  Service: Orthopedics;  Laterality: Right;  Right Total Knee Arthroplasty   TUBAL LIGATION  1980's   Social History:  reports that she quit smoking about 30 years ago. Her smoking use included cigarettes. She started smoking about 67 years ago. She has a 27.8 pack-year smoking history. She has never used smokeless tobacco. She reports current alcohol use of about 7.0 standard drinks of alcohol per week. She reports that she does not use drugs.  Allergies  Allergen Reactions   Morphine And Codeine Itching   Aspirin Other (See Comments)    Unknown reaction   Duloxetine Hcl Other (See Comments)    Unknown reaction   Tymlos  [Abaloparatide] Other (See Comments)    Unknown reaction   Ventolin [Albuterol] Other (See Comments)    rapid heart beat   Cefadroxil Hives    Patient can take amoxicillin and cipro    Family History  Problem Relation Age of Onset   Heart attack Mother    Heart disease Father    Heart failure Father    Bladder Cancer Father    Supraventricular tachycardia Sister    Hypertension Sister     Prior to Admission medications   Medication Sig Start Date End Date Taking? Authorizing Provider  acetaminophen (TYLENOL) 650 MG CR tablet Take 1,300 mg by mouth at bedtime.    [provider]  albuterol (VENTOLIN HFA) 108 (90 Base) MCG/ACT inhaler Inhale 2 puffs into the lungs every 4 (four) hours as needed for wheezing or shortness of breath.    [provider]  alendronate (FOSAMAX) 70 MG tablet Take 70 mg by mouth every Sunday. Take with a full glass of water on an empty stomach.    [provider]  Ascorbic Acid (VITAMIN C) 1000 MG tablet Take 1,000 mg by mouth every morning.    [provider]  cetirizine (ZYRTEC) 5 MG chewable tablet Chew 5 mg by mouth daily as needed for  allergies.    [provider]  Cholecalciferol (VITAMIN D3) 50 MCG (2000 UT) TABS Take 2,000 Units by mouth every morning.    [provider]  docusate sodium (COLACE) 100 MG capsule Take 200 mg by mouth at bedtime.    [provider]  dorzolamide-timolol (COSOPT) 22.3-6.8 MG/ML ophthalmic solution Place 1 drop into both eyes 2 (two) times daily.    [provider]  ferrous gluconate (FERGON) 240 (27 FE) MG tablet Take 240 mg by mouth every morning. Take without food    [provider]  fluticasone (FLONASE) 50 MCG/ACT nasal spray Place 1 spray into both nostrils daily as needed for allergies or rhinitis.    [provider]  latanoprost (XALATAN) 0.005 % ophthalmic solution Place 1 drop into both eyes at bedtime.    [provider]  LIDOCAINE EX Apply 1 application  topically in the morning and at bedtime.    [provider]  LIDOCAINE EX Apply 1 Application topically daily. Right Ankle.    [provider]  METOPROLOL TARTRATE PO Take 6.25 mg by mouth in the morning and at bedtime.    [provider]  Multiple Vitamins-Minerals (PRESERVISION AREDS 2) CAPS Take 1 capsule by mouth 2 (two) times daily.    [provider]  Netarsudil Dimesylate (RHOPRESSA) 0.02 % SOLN Place 1 drop into both eyes at bedtime.    [provider]  oxybutynin (DITROPAN-XL) 5 MG 24 hr tablet Take 5 mg by mouth daily. 11/22/22   [provider]  pantoprazole (PROTONIX) 40 MG tablet Take 40 mg by mouth 2 (two) times daily.    [provider]  polyethylene glycol (MIRALAX / GLYCOLAX) 17 g packet Take 8.5 g by mouth daily as needed (constipation).    [provider]  Probiotic Product (ALIGN) 4 MG CAPS Take 4 mg by mouth at bedtime.    [provider]  rOPINIRole (REQUIP) 0.5 MG tablet Take 0.5 mg by mouth as needed.    [provider]  sertraline (ZOLOFT) 50 MG tablet Take 1.5 tablets (75 mg total) by mouth daily. 01/07/23   Rhetta Mura, MD  simvastatin (ZOCOR) 20 MG tablet Take 20 mg by mouth every evening.    [provider]  Sodium Fluoride (PREVIDENT 5000 BOOSTER PLUS) 1.1 % PSTE Place 1 Application onto teeth See admin instructions. Brush on teeth with a toothbrush after evening mouth care. Spit out excess and do not rinse.    [provider]  umeclidinium-vilanterol (ANORO ELLIPTA) 62.5-25 MCG/ACT AEPB Inhale 1 puff into the lungs every morning.    [provider]  zinc oxide 20 % ointment Apply 1 Application topically See admin instructions. Apply topically to buttocks after every incontinent episode and as needed for redness    [provider]    Physical Exam: Vitals:   04/19/23 1126 04/19/23 1127 04/19/23 1200  04/19/23 1230  BP:   (!) 115/51 (!) 120/56  Pulse: 80  87 (!) 107  Resp:   16 18  Temp:      TempSrc:      SpO2: 96%  95% 91%  Weight:      Height:  5\' 1"  (1.549 m)     Physical Exam Vitals and nursing note reviewed.  Constitutional:      General: She is awake. She is not in acute distress. HENT:     Head: Normocephalic.     Nose: No rhinorrhea.     Mouth/Throat:  Mouth: Mucous membranes are moist.  Eyes:     General: No scleral icterus.    Pupils: Pupils are equal, round, and reactive to light.  Neck:     Vascular: No JVD.  Cardiovascular:     Rate and Rhythm: Normal rate and regular rhythm.     Heart sounds: S1 normal and S2 normal.  Pulmonary:     Effort: Pulmonary effort is normal.     Breath sounds: Normal breath sounds. No wheezing, rhonchi or rales.  Abdominal:     General: Bowel sounds are normal.     Palpations: Abdomen is soft.  Musculoskeletal:     Cervical back: Neck supple.     Right lower leg: No edema.     Left lower leg: No edema.  Skin:    Coloration: Skin is pale.  Neurological:     General: No focal deficit present.     Mental Status: She is alert and oriented to person, place, and time.  Psychiatric:        Mood and Affect: Mood normal.        Behavior: Behavior normal. Behavior is cooperative.    Data Reviewed:  Results are pending, will review when available.  Assessment and Plan: Principal Problem:   UGI bleed With acute on chronic: Iron deficiency anemia Admit to stepdown/inpatient. Clear liquid diet. Keep NPO after midnight. Continue pantoprazole 40 mg IVP twice daily Monitor H&H. Transfuse further as needed. GI will evaluate.  Active Problems:   Depression with anxiety Continue sertraline 25 mg p.o. at bedtime.    Hyperlipidemia Continue simvastatin 20 mg p.o. daily.    Glaucoma Continue Cosopt, Rhopressa and Xalatan drops. Follow-up with ophthalmology as an outpatient.    COPD (chronic obstructive pulmonary  disease) (HCC) Asymptomatic at this time. Bronchodilators as needed.    Hypertension Hold metoprolol tonight.    GERD (gastroesophageal reflux disease) On PPI.    Class 1 obesity with body mass index (BMI) of 34.0 to 34.9 in adult Current BMI 34.01 kg/m. Lifestyle modifications. Follow-up with closely PCP.    Thoracic aorta atherosclerosis (HCC) On simvastatin.    Pulmonary nodule 1 cm or greater in diameter Follow-up with PCP of pulmonary clinic as an outpatient.   Advance Care Planning:   Code Status: Do not attempt resuscitation (DNR) PRE-ARREST INTERVENTIONS DESIRED   Consults: [facility].  Family Communication:   Severity of Illness: The appropriate patient status for this patient is INPATIENT. Inpatient status is judged to be reasonable and necessary in order to provide the required intensity of service to ensure the patient's safety. The patient's presenting symptoms, physical exam findings, and initial radiographic and laboratory data in the context of their chronic comorbidities is felt to place them at high risk for further clinical deterioration. Furthermore, it is not anticipated that the patient will be medically stable for discharge from the hospital within 2 midnights of admission.   * I certify that at the point of admission it is my clinical judgment that the patient will require inpatient hospital care spanning beyond 2 midnights from the point of admission due to high intensity of service, high risk for further deterioration and high frequency of surveillance required.*  Author: Bobette Mo, MD 04/19/2023 2:25 PM  For on call review [email or web address].   This document was prepared using Dragon voice recognition software and may contain some unintended transcription errors.

## 2023-04-19 NOTE — ED Triage Notes (Signed)
Pt BIB by [facility] from [facility], c/o blood in stool for 2 1/2 days. Has hx of GI Bleeds. Had lab work done yesterday and hgb was low. Denies shob, pain, or weakness. VSS, NAD.  BP 122/50, HR 88, Spo2 96 RA, RR 16, CBG 137

## 2023-04-19 NOTE — ED Provider Notes (Signed)
[place] EMERGENCY DEPARTMENT AT [facility] Provider Note   CSN: [id number] Arrival date & time: 04/19/23  1110     History  No chief complaint on file.  HPI [name] is a 82 y.o. female with history of lower GI bleeding, iron deficiency anemia and COPD presenting for bloody stools.  States this started at 2 and half days ago.  States stool was initially black in color but is more dark red today.  States she is filling up the toilet bowl with dark red stool.  Also states that she is always short of breath with her COPD but has not been notably short of breath the last couple days and fatigue as well.  Denies dizziness or lightheadedness at this time.  Denies use of blood thinners.  Was seen by her PCP for bloody stools 2 days ago.  Hemoglobin was checked at that time and was found to be low.  Denies recent use of salicylates and NSAIDs.  Denies abdominal pain, nausea, vomiting, and diarrhea.   HPI     Home Medications Prior to Admission medications   Medication Sig Start Date End Date Taking? Authorizing Provider  acetaminophen (TYLENOL) 500 MG tablet Take 1,000 mg by mouth every 8 (eight) hours as needed for moderate pain (pain score 4-6).   Yes [provider]  acetaminophen (TYLENOL) 650 MG CR tablet Take 1,300 mg by mouth at bedtime.   Yes [provider]  albuterol (VENTOLIN HFA) 108 (90 Base) MCG/ACT inhaler Inhale 2 puffs into the lungs every 4 (four) hours as needed for wheezing or shortness of breath.   Yes [provider]  Ascorbic Acid (VITAMIN C) 1000 MG tablet Take 1,000 mg by mouth every morning.   Yes [provider]  cetirizine (ZYRTEC) 5 MG chewable tablet Chew 5 mg by mouth daily as needed for allergies.   Yes [provider]  Cholecalciferol (VITAMIN D3) 50 MCG (2000 UT) TABS Take 2,000 Units by mouth every morning.   Yes [provider]  docusate sodium (COLACE) 100 MG capsule Take 200 mg by  mouth at bedtime.   Yes [provider]  dorzolamide-timolol (COSOPT) 22.3-6.8 MG/ML ophthalmic solution Place 1 drop into both eyes 2 (two) times daily.   Yes [provider]  ferrous gluconate (FERGON) 240 (27 FE) MG tablet Take 240 mg by mouth every morning. Take without food   Yes [provider]  fluticasone (FLONASE) 50 MCG/ACT nasal spray Place 1 spray into both nostrils daily as needed for allergies or rhinitis.   Yes [provider]  latanoprost (XALATAN) 0.005 % ophthalmic solution Place 1 drop into both eyes at bedtime.   Yes [provider]  LIDOCAINE EX Apply 1 application  topically in the morning and at bedtime. Right shoulder to elbow   Yes [provider]  LIDOCAINE EX Apply 1 Application topically daily. Right Ankle.   Yes [provider]  METOPROLOL TARTRATE PO Take 6.25 mg by mouth in the morning and at bedtime.   Yes [provider]  Multiple Vitamins-Minerals (PRESERVISION AREDS 2) CAPS Take 1 capsule by mouth 2 (two) times daily.   Yes [provider]  Netarsudil Dimesylate (RHOPRESSA) 0.02 % SOLN Place 1 drop into both eyes at bedtime.   Yes [provider]  oxybutynin (DITROPAN-XL) 5 MG 24 hr tablet Take 5 mg by mouth at bedtime. 11/22/22  Yes [provider]  pantoprazole (PROTONIX) 40 MG tablet Take 40 mg  by mouth 2 (two) times daily.   Yes [provider]  polyethylene glycol (MIRALAX / GLYCOLAX) 17 g packet Take 8.5 g by mouth daily as needed (constipation).   Yes [provider]  Probiotic Product (ALIGN) 4 MG CAPS Take 4 mg by mouth at bedtime.   Yes [provider]  rOPINIRole (REQUIP) 0.5 MG tablet Take 0.5 mg by mouth as needed.   Yes [provider]  sertraline (ZOLOFT) 50 MG tablet Take 1.5 tablets (75 mg total) by mouth daily. 01/07/23  Yes Rhetta Mura, MD  simvastatin (ZOCOR) 20 MG tablet Take 20 mg by mouth every evening.    Yes [provider]  Sodium Fluoride (PREVIDENT 5000 BOOSTER PLUS) 1.1 % PSTE Place 1 Application onto teeth See admin instructions. Brush on teeth with a toothbrush after evening mouth care. Spit out excess and do not rinse.   Yes [provider]  umeclidinium-vilanterol (ANORO ELLIPTA) 62.5-25 MCG/ACT AEPB Inhale 1 puff into the lungs every morning.   Yes [provider]  zinc oxide 20 % ointment Apply 1 Application topically See admin instructions. Apply topically to buttocks after every incontinent episode and as needed for redness   Yes [provider]  alendronate (FOSAMAX) 70 MG tablet Take 70 mg by mouth every Sunday. Take with a full glass of water on an empty stomach.    [provider]  potassium chloride SA (KLOR-CON M) 20 MEQ tablet Take 20 mEq by mouth 2 (two) times daily. Patient not taking: Reported on 04/19/2023 01/22/23   [provider]      Allergies    Morphine and codeine, Aspirin, Duloxetine hcl, Tymlos [abaloparatide], Ventolin [albuterol], and Cefadroxil    Review of Systems   See HPI for pertinent positives   Physical Exam   Vitals:   04/19/23 1200 04/19/23 1230  BP: (!) 115/51 (!) 120/56  Pulse: 87 (!) 107  Resp: 16 18  Temp:    SpO2: 95% 91%    CONSTITUTIONAL: well-appearing, NAD NEURO:  Alert and oriented x 3, CN 3-12 grossly intact EYES:  eyes equal and reactive ENT/NECK:  Supple, no stridor CARDIO:  regular rate and rhythm, appears well-perfused  PULM:  No respiratory distress, CTAB GI/GU:  non-distended, protuberant, soft, non tender Rectal: Blood noted around the anus.  Significant maroon-colored blood on digital rectal exam.  No masses or lesions noted in the rectal vault.  Hemoccult positive. MSK/SPINE:  No gross deformities, no edema, moves all extremities  SKIN:  pale, no rash, atraumatic  *Additional and/or pertinent findings included in MDM below  ED Results / Procedures / Treatments    Labs (all labs ordered are listed, but only abnormal results are displayed) Labs Reviewed  COMPREHENSIVE METABOLIC PANEL - Abnormal; Notable for the following components:      Result Value   Glucose, Bld 128 (*)    BUN 24 (*)    Calcium 8.5 (*)    Total Protein 5.8 (*)    Albumin 3.4 (*)    Alkaline Phosphatase 31 (*)    All other components within normal limits  CBC WITH DIFFERENTIAL/PLATELET - Abnormal; Notable for the following components:   RBC 1.85 (*)    Hemoglobin 5.9 (*)    HCT 19.2 (*)    MCV 103.8 (*)    RDW 15.9 (*)    nRBC 0.3 (*)    All other components within normal limits  PROTIME-INR - Abnormal; Notable for the following components:   Prothrombin Time  16.2 (*)    INR 1.3 (*)    All other components within normal limits  I-STAT CHEM 8, ED - Abnormal; Notable for the following components:   Glucose, Bld 124 (*)    Hemoglobin 6.1 (*)    HCT 18.0 (*)    All other components within normal limits  POC OCCULT BLOOD, ED - Abnormal; Notable for the following components:   Fecal Occult Bld POSITIVE (*)    All other components within normal limits  HEMOGLOBIN AND HEMATOCRIT, BLOOD  TYPE AND SCREEN  PREPARE RBC (CROSSMATCH)  PREPARE RBC (CROSSMATCH)    EKG None  Radiology No results found.  Procedures .Critical Care  Performed by: Gareth [facility], PA-C Authorized by: Gareth [facility], PA-C   Critical care provider statement:    Critical care time (minutes):  30   Critical care was necessary to treat or prevent imminent or life-threatening deterioration of the following conditions: Anemia secondary to GI bleed requiring blood transfusion.   Critical care was time spent personally by me on the following activities:  Development of treatment plan with patient or surrogate, discussions with consultants, evaluation of patient's response to treatment, examination of patient, ordering and review of laboratory studies, ordering and review of radiographic studies,  ordering and performing treatments and interventions, pulse oximetry, re-evaluation of patient's condition and review of old charts     Medications Ordered in ED Medications  pantoprazole (PROTONIX) injection 40 mg (40 mg Intravenous Given 04/19/23 1455)  acetaminophen (TYLENOL) tablet 650 mg (has no administration in time range)    Or  acetaminophen (TYLENOL) suppository 650 mg (has no administration in time range)  prochlorperazine (COMPAZINE) injection 5 mg (has no administration in time range)  sodium chloride 0.9 % bolus 500 mL (0 mLs Intravenous Stopped 04/19/23 1413)  iohexol (OMNIPAQUE) 350 MG/ML injection 100 mL (100 mLs Intravenous Contrast Given 04/19/23 1419)    ED Course/ Medical Decision Making/ A&P                                 Medical Decision Making Amount and/or Complexity of Data Reviewed Labs: ordered. Radiology: ordered.  Risk Prescription drug management. Decision regarding hospitalization.   Initial Impression and Ddx 82 year old well-appearing female presenting for bloody stools.  Exam was notable for maroon-colored stool on DRE.  DDx includes upper versus lower GI bleed, mesenteric ischemia, symptomatic anemia, intra-abdominal infection, other. Patient PMH that increases complexity of ED encounter:  history of lower GI bleeding, iron deficiency anemia and COPD  Interpretation of Diagnostics - I independent reviewed and interpreted the labs as followed: Anemia (Hgb 5.9), Hemoccult positive, elevated BUN  -Abdominal CT angio pending Patient Reassessment and Ultimate Disposition/Management Workup revealing suspicion for GI bleed.  Discussed patient with Dr. Marca Ancona of GI who suspects it could be lower GI.  Recommended CT angio which is pending. She also recommended Protonix twice daily until source of GI bleed is confirmed.  Ordered 2 units of blood for her anemia.  Fluid resuscitated with a half bolus of normal saline.  Admitted to hospital service with  Dr. Robb Matar.  At this time I will suspicion for intra-abdominal infection given benign abdominal exam.  Patient management required discussion with the following services or consulting groups:  Hospitalist Service and Gastroenterology  Complexity of Problems Addressed Acute complicated illness or Injury  Additional Data Reviewed and Analyzed Further history obtained from: Past medical history and medications listed in  the EMR and Prior ED visit notes  Patient Encounter Risk Assessment Consideration of hospitalization         Final Clinical Impression(s) / ED Diagnoses Final diagnoses:  Gastrointestinal hemorrhage, unspecified gastrointestinal hemorrhage type    Rx / DC Orders ED Discharge Orders     None         G[facility], PA-C 04/19/23 1518    Pricilla Loveless, MD 04/20/23 [phone]

## 2023-04-19 NOTE — Plan of Care (Signed)
Plan of care and goals reviewed with patient, time given for questions, patient handbook/guide at bedside. Rectal pouch placed due to multiple bloody stools, as a prevention for skin breakdown. Education provide to patient as its function, patient agrees and has good understanding of its purpose.  Problem: Education: Goal: Knowledge of General Education information will improve Description: Including pain rating scale, medication(s)/side effects and non-pharmacologic comfort measures Outcome: Progressing   Problem: Health Behavior/Discharge Planning: Goal: Ability to manage health-related needs will improve Outcome: Progressing   Problem: Clinical Measurements: Goal: Ability to maintain clinical measurements within normal limits will improve Outcome: Progressing Goal: Will remain free from infection Outcome: Progressing Goal: Diagnostic test results will improve Outcome: Progressing Goal: Respiratory complications will improve Outcome: Progressing Goal: Cardiovascular complication will be avoided Outcome: Progressing   Problem: Activity: Goal: Risk for activity intolerance will decrease Outcome: Progressing   Problem: Nutrition: Goal: Adequate nutrition will be maintained Outcome: Progressing   Problem: Coping: Goal: Level of anxiety will decrease Outcome: Progressing   Problem: Elimination: Goal: Will not experience complications related to bowel motility Outcome: Progressing Goal: Will not experience complications related to urinary retention Outcome: Progressing   Problem: Pain Managment: Goal: General experience of comfort will improve Outcome: Progressing   Problem: Safety: Goal: Ability to remain free from injury will improve Outcome: Progressing   Problem: Skin Integrity: Goal: Risk for impaired skin integrity will decrease Outcome: Progressing

## 2023-04-20 DIAGNOSIS — K922 Gastrointestinal hemorrhage, unspecified: Secondary | ICD-10-CM

## 2023-04-20 LAB — COMPREHENSIVE METABOLIC PANEL
ALT: 9 U/L (ref 0–44)
AST: 16 U/L (ref 15–41)
Albumin: 3 g/dL — ABNORMAL LOW (ref 3.5–5.0)
Alkaline Phosphatase: 27 U/L — ABNORMAL LOW (ref 38–126)
Anion gap: 4 — ABNORMAL LOW (ref 5–15)
BUN: 24 mg/dL — ABNORMAL HIGH (ref 8–23)
CO2: 25 mmol/L (ref 22–32)
Calcium: 7.6 mg/dL — ABNORMAL LOW (ref 8.9–10.3)
Chloride: 113 mmol/L — ABNORMAL HIGH (ref 98–111)
Creatinine, Ser: 0.31 mg/dL — ABNORMAL LOW (ref 0.44–1.00)
GFR, Estimated: >60 mL/min (ref >60)
Glucose, Bld: 108 mg/dL — ABNORMAL HIGH (ref 70–99)
Potassium: 3.1 mmol/L — ABNORMAL LOW (ref 3.5–5.1)
Sodium: 142 mmol/L (ref 135–145)
Total Bilirubin: 1.1 mg/dL (ref 0.3–1.2)
Total Protein: 5 g/dL — ABNORMAL LOW (ref 6.5–8.1)

## 2023-04-20 LAB — CBC
HCT: 26 % — ABNORMAL LOW (ref 36.0–46.0)
Hemoglobin: 8.5 g/dL — ABNORMAL LOW (ref 12.0–15.0)
MCH: 30.7 pg (ref 26.0–34.0)
MCHC: 32.7 g/dL (ref 30.0–36.0)
MCV: 93.9 fL (ref 80.0–100.0)
Platelets: 117 10*3/uL — ABNORMAL LOW (ref 150–400)
RBC: 2.77 MIL/uL — ABNORMAL LOW (ref 3.87–5.11)
RDW: 17.5 % — ABNORMAL HIGH (ref 11.5–15.5)
WBC: 9.2 10*3/uL (ref 4.0–10.5)
nRBC: 0 % (ref 0.0–0.2)

## 2023-04-20 MED ORDER — ORAL CARE MOUTH RINSE: 15.0000 mL | OROMUCOSAL | Status: DC | PRN

## 2023-04-20 MED ORDER — POTASSIUM CHLORIDE CRYS ER 20 MEQ PO TBCR
40.0000 meq | EXTENDED_RELEASE_TABLET | Freq: Once | ORAL | Status: AC
  Administered 2023-04-20: 40 meq via ORAL
  Filled 2023-04-20: qty 4

## 2023-04-20 MED ORDER — POTASSIUM CHLORIDE 10 MEQ/100ML IV SOLN
10.0000 meq | INTRAVENOUS | Status: AC
  Administered 2023-04-20 (×2): 10 meq via INTRAVENOUS
  Filled 2023-04-20 (×2): qty 100

## 2023-04-20 NOTE — Progress Notes (Signed)
Patient placed on enteric precautions for rule out C-diff, however patient does not meet criteria for testing due to patient hospital diagnosis/admission is upper GI bleed, also patient now having formed stools, an also the first sample that was sent to lab was rejected due to it being formed. Will pass on to team leaders to have order discontinued.

## 2023-04-20 NOTE — Progress Notes (Signed)
PROGRESS NOTE [name]  ZHY:[id number] DOB: 05-06-1941 DOA: 04/19/2023 PCP: Octavia Heir, NP  Brief Narrative/Hospital Course: 82 y.o.f w/ anxiety, depression, chronic diastolic heart failure, osteoarthritis, glaucoma, hyperlipidemia, hypertension, pulmonary nodule, restless leg syndrome, osteoporosis, COPD, aortic arthrosclerosis, GERD, history of diverticular bleed with embolization, UGI bleed, chronic iron deficiency anemia due to chronic blood loss with multiple admissions due to GI bleed most recently seen from 03/27/2023 until 03/29/2023 with recent EGD in August 2024 that showed gastritis who presented to the ED from facility due to low hemoglobin level associated with several melena episodes.  In EX:BMWUXL Temp-98.1 F,HR-78,RR 16, BP 111/48 mmHg O2 sat 98% on RA.The patient received 500 mL normal saline bolus.Labs:WBC-7.9,HB-5.9 g/dL with an MCV of 244.0 fL and platelets 554.  PT 16.2 and INR 1.3.FOBT positive.CMP showed normal electrolytes after calcium correction, glucose 128, BUN 24 and creatinine 0.49 mg/deciliter.  AST 31 units/L.  Total protein was 5.8 and albumin 3.4 g/dL.  The rest of the LFTs were unremarkable. CTA GI bleed>> with no evidence of active GI bleed by CT. If further imaging evaluation required and nuclear medicine GI bleeding scan could be performed.  Cholelithiasis without cholecystitis.  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  Aortic atherosclerosis. 2u PRBC ordered and admitted     Subjective: Patient seen and examined this morning She is alert awake oriented resting comfortably no complaints nursing reports bowel frequency improving hypoglycemic platelet count stable Some abdominal pain but no nausea vomiting now  Assessment and Plan: Principal Problem:   UGI bleed Active Problems:   Depression with anxiety   Hyperlipidemia   Glaucoma   COPD (chronic obstructive pulmonary disease) (HCC)   Iron deficiency anemia    Hypertension   Pulmonary nodule 1 cm or greater in diameter   GERD (gastroesophageal reflux disease)   Class 1 obesity with body mass index (BMI) of 34.0 to 34.9 in adult   Thoracic aorta atherosclerosis (HCC)   Severe anemia on chronic anemia due to GI blood loss Melena FOBT positive GERD History of upper GI bleed: Recent admission in September for GI bleeding had EGD in August showing gastritis.  Patient received 2 units PRBC  and hemoglobin has appropriately increased today. Continue n.p.o., PPI monitor hemodynamics.  GI team consulted.   Recent Labs    05/03/22 1033 05/03/22 1830 05/06/22 1247 05/07/22 0954 02/07/23 0000 03/27/23 2234 04/03/23 0000 04/19/23 1144 04/19/23 1227 04/19/23 2033 04/20/23 0739  HGB  --    < >  --    < > 11.4*   < > 10.8* 5.9* 6.1* 6.7* 8.5*  MCV  --    < >  --    < >  --    < >  --  103.8*  --   --  93.9  VITAMINB12 412  --  348  --   --   --   --   --   --   --   --   FOLATE 28.7  --  19.6  --   --   --   --   --   --   --   --   FERRITIN 34  --  45  --  44  --   --   --   --   --   --   TIBC 258  --  232*  --  326  --   --   --   --   --   --  IRON 143  --  87  --  70  --   --   --   --   --   --   RETICCTPCT 2.2  --  3.8*  --   --   --   --   --   --   --   --    < > = values in this interval not displayed.   Hypokalemia: Creatinine stable potassium 3.1 will replace Recent Labs  Lab 04/19/23 1144 04/19/23 1227 04/20/23 0739  K 3.5 3.6 3.1*   Thrombocytopenia: Likely from acute illness ??.  Monitor platelet count at 117 today.   Anxiety/depression: Mood is stable on sertraline  HLD Thoracic aortic atherosclerosis: Continue statin  HTN: Controlled, hold metoprolol due to #1.  COPD: Not in exacerbation, CONT Nebs pnr  Pulmonary nodule > 1 cm: Need follow-up with PCP/pulmonary as OP if she desires  Obesity with BMI of 34: Will benefit with weight loss  Resident of ALF  DVT prophylaxis: SCDs Start: 04/19/23 1443 Code  Status:   Code Status: Do not attempt resuscitation (DNR) PRE-ARREST INTERVENTIONS DESIRED Family Communication: plan of care discussed with patient/no family at bedside. Patient status is: The patient because of severe anemia Level of care: Stepdown   Dispo: The patient is from: alf            Anticipated disposition: tbd Objective: Vitals last 24 hrs: Vitals:   04/20/23 0800 04/20/23 0900 04/20/23 1000 04/20/23 1013  BP: (!) 134/50 (!) 123/43 (!) 124/39   Pulse: 97 80 91   Resp: (!) 25 20 (!) 22   Temp:    98 F (36.7 C)  TempSrc:    Oral  SpO2: 97% 98% 98%   Weight:      Height:       Weight change:   Physical Examination: General exam: alert awake, older than stated age HEENT:Oral mucosa moist, Ear/Nose WNL grossly Respiratory system: bilaterally clear BS, no use of accessory muscle Cardiovascular system: S1 & S2 +, No JVD. Gastrointestinal system: Abdomen soft,NT,ND, BS+ Nervous System:Alert, awake, moving extremities. Extremities: LE edema neg,distal peripheral pulses palpable.  Skin: No rashes,no icterus. MSK: Normal muscle bulk,tone, power  Medications reviewed:  Scheduled Meds:  Chlorhexidine Gluconate Cloth  6 each Topical Daily   dorzolamide-timolol  1 drop Both Eyes BID   latanoprost  1 drop Both Eyes QHS   Netarsudil Dimesylate  1 drop Both Eyes QHS   oxybutynin  5 mg Oral QHS   pantoprazole  40 mg Intravenous Q12H   sertraline  75 mg Oral Daily   simvastatin  20 mg Oral QPM   umeclidinium-vilanterol  1 puff Inhalation q morning   Continuous Infusions:    Diet Order             Diet NPO time specified  Diet effective midnight                  Intake/Output Summary (Last 24 hours) at 04/20/2023 1049 Last data filed at 04/20/2023 0600 Gross per 24 hour  Intake 1417 ml  Output --  Net 1417 ml   Net IO Since Admission: 1,417 mL [04/20/23 1049]  Wt Readings from Last 3 Encounters:  04/19/23 81.6 kg  04/16/23 82.3 kg  04/10/23 82.3 kg      Unresulted Labs (From admission, onward)     Start     Ordered   04/21/23 0500  Basic metabolic panel  Daily,   R  Question:  Specimen collection method  Answer:  Lab=Lab collect   04/20/23 0741   04/21/23 0500  CBC  Daily,   R     Question:  Specimen collection method  Answer:  Lab=Lab collect   04/20/23 0741          Data Reviewed: I have personally reviewed following labs and imaging studies CBC: Recent Labs  Lab 04/19/23 1144 04/19/23 1227 04/19/23 2033 04/20/23 0739  WBC 7.9  --   --  9.2  NEUTROABS 5.3  --   --   --   HGB 5.9* 6.1* 6.7* 8.5*  HCT 19.2* 18.0* 21.9* 26.0*  MCV 103.8*  --   --  93.9  PLT 154  --   --  117*   Basic Metabolic Panel: Recent Labs  Lab 04/19/23 1144 04/19/23 1227 04/20/23 0739  NA 139 141 142  K 3.5 3.6 3.1*  CL 106 104 113*  CO2 25  --  25  GLUCOSE 128* 124* 108*  BUN 24* 22 24*  CREATININE 0.49 0.60 0.31*  CALCIUM 8.5*  --  7.6*   GFR: Estimated Creatinine Clearance: 50.7 mL/min (A) (by C-G formula based on SCr of 0.31 mg/dL (L)). Liver Function Tests: Recent Labs  Lab 04/19/23 1144 04/20/23 0739  AST 16 16  ALT 9 9  ALKPHOS 31* 27*  BILITOT 0.6 1.1  PROT 5.8* 5.0*  ALBUMIN 3.4* 3.0*  No results for input(s): "LIPASE", "AMYLASE" in the last 168 hours. No results for input(s): "AMMONIA" in the last 168 hours. Coagulation Profile: Recent Labs  Lab 04/19/23 1144  INR 1.3*  No results for input(s): "PROCALCITON", "LATICACIDVEN" in the last 168 hours.  Recent Results (from the past 240 hour(s))  MRSA Next Gen by PCR, Nasal     Status: None   Collection Time: 04/19/23  4:50 PM   Specimen: Nasal Mucosa; Nasal Swab  Result Value Ref Range Status   MRSA by PCR Next Gen NOT DETECTED NOT DETECTED Final    Comment: (NOTE) The GeneXpert MRSA Assay (FDA approved for NASAL specimens only), is one component of a comprehensive MRSA colonization surveillance program. It is not intended to diagnose MRSA infection nor to  guide or monitor treatment for MRSA infections. Test performance is not FDA approved in patients less than 56 years old. Performed at [facility], [address], [place], [place] [address]     Antimicrobials: Anti-infectives (From admission, onward)    None      Culture/Microbiology    Component Value Date/Time   SDES BLOOD RIGHT ANTECUBITAL 09/06/2017 0950   SPECREQUEST  09/06/2017 0950    BOTTLES DRAWN AEROBIC AND ANAEROBIC Blood Culture adequate volume   CULT  09/06/2017 0950    NO GROWTH 5 DAYS Performed at [facility], [address] [place], [place] [address]    REPTSTATUS 09/11/2017 FINAL 09/06/2017 0950  Radiology Studies: CT ANGIO GI BLEED  Result Date: 04/19/2023 CLINICAL DATA:  GI bleed.  Anemia EXAM: CTA ABDOMEN AND PELVIS WITHOUT AND WITH CONTRAST TECHNIQUE: Multidetector CT imaging of the abdomen and pelvis was performed using the standard protocol during bolus administration of intravenous contrast. Multiplanar reconstructed images and MIPs were obtained and reviewed to evaluate the vascular anatomy. RADIATION DOSE REDUCTION: This exam was performed according to the departmental dose-optimization program which includes automated exposure control, adjustment of the mA and/or kV according to patient size and/or use of iterative reconstruction technique. CONTRAST:  OMNIPAQUE IOHEXOL 350 MG/ML SOLN  COMPARISON:  05/03/2022 FINDINGS: VASCULAR Aorta: Normal caliber aorta without aneurysm, dissection, vasculitis or significant stenosis. Moderate atherosclerosis. Celiac: Patent without evidence of aneurysm, dissection, vasculitis or significant stenosis. SMA: Patent without evidence of aneurysm, dissection, vasculitis or significant stenosis. Renals: Atherosclerotic plaque at the ostia of the bilateral renal arteries demonstrates right greater than left proximal stenosis, similar to prior. No aneurysm or evidence of fibromuscular dysplasia. IMA:  Patent. Inflow: Patent without evidence of aneurysm, dissection, vasculitis or significant stenosis. Proximal Outflow: Bilateral common femoral and visualized portions of the superficial and profunda femoral arteries are patent without evidence of aneurysm, dissection, vasculitis or significant stenosis. Veins: Major venous structures of the abdomen are patent. Review of the MIP images confirms the above findings. NON-VASCULAR Lower chest: No acute abnormality. Hepatobiliary: No focal liver abnormality. Small amount of layering sludge or small stones within the gallbladder. No gallbladder wall thickening or pericholecystic inflammatory changes by CT. No biliary dilatation. Pancreas: Unremarkable. No pancreatic ductal dilatation or surrounding inflammatory changes. Spleen: Normal in size without focal abnormality. Adrenals/Urinary Tract: Unremarkable adrenal glands. Unchanged left caliceal diverticulum. Kidneys enhance symmetrically. No stone or hydronephrosis. Urinary bladder within normal limits. Stomach/Bowel: Stomach within normal limits. No abnormally dilated loops of bowel. Normal appendix in the right lower quadrant. Severe colonic diverticulosis. No focal bowel wall thickening or inflammatory changes. No contrast accumulation within the bowel lumen is identified on multi phasic imaging to suggest active GI bleed by CT. Lymphatic: No abdominopelvic lymphadenopathy. Reproductive: Uterus and bilateral adnexa are unremarkable. Other: No free air or free fluid. 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 No acute bony abnormalities. IMPRESSION: 1. No evidence of active GI bleed by CT. If further imaging evaluation is clinically warranted, a nuclear medicine GI bleeding scan could be performed. 2. Severe colonic diverticulosis without evidence of acute diverticulitis. 3. Cholelithiasis without evidence of acute cholecystitis. 4. Chronic L1 vertebral body  compression fracture with vertebra plana deformity. 5. Aortic atherosclerosis (ICD10-I70.0). Electronically Signed   By: Duanne Guess D.O.   On: 04/19/2023 15:46    LOS: 1 day  Lanae Boast, MD [facility]  04/20/2023, 10:49 AM

## 2023-04-20 NOTE — Consult Note (Signed)
[facility] Consult  Referring Provider: [facility] hospitalist Primary Care Physician:  Octavia Heir, NP Primary Gastroenterologist: Dr. Ewing Schlein   Reason for Consultation: Black stools  HPI: [name] is a 82 y.o. female patient presented to the ED with multiple episode of black, dark stools.  In the ER, she was found to have a hemoglobin of 5.9, has needed 3 units PRBC transfusion with subsequent hemoglobin being 8.5 today. Last bowel movement was today morning described as liquid black dark brown. Denies abdominal pain, nausea, vomiting, acid reflux or heartburn. She complains of occasional difficulty swallowing solids. She denies use of blood thinners, antiplatelets, NSAIDs, Goody powders, aspirin.  Previous GI workup: EGD, Dr. Ewing Schlein, melena, inpatient, 01/06/2023: Polyps in gastric body, mild inflammation, no bleeding Colonoscopy, Dr. Marca Ancona, inpatient, hematochezia: Dark blood found in entire colon, diverticulosis in sigmoid, descending, splenic flexure, transverse, hepatic flexure and ascending colon, 2 sessile serrated adenomas removed EGD, melena, inpatient, Dr. Ewing Schlein, 01/11/2022: Tiny hiatal hernia, food residue, otherwise unremarkable EGD, Dr. Marca Ancona, inpatient, dysphagia, dysmotility, esophageal stricture on barium swallow, 05/08/2021: Candida esophagitis, empiric balloon dilation performed, gastric polyps, otherwise unremarkable  Past Medical History:  Diagnosis Date   Anxiety    Arthritis    "bilateral knees, shoulders, elbows; neck, pretty widespread" (09/05/2017)   BPPV (benign paroxysmal positional vertigo)    Depression    GERD (gastroesophageal reflux disease)    Glaucoma, both eyes    Headache    "probably 2/month" (09/05/2017)   History of blood transfusion ~ 2008   "related to LGIB"   Hyperlipemia    Lower GI bleeding ~ 2008; 09/05/2017   "had to have blood transfusion"   Macular degeneration, bilateral    Osteopenia    Seasonal allergies    Skin cancer, basal  cell 2001   "off my nose, left side"   Sleeping excessive    Tinnitus of both ears     Past Surgical History:  Procedure Laterality Date   BALLOON DILATION N/A 05/08/2021   Procedure: BALLOON DILATION;  Surgeon: Kerin Salen, MD;  Location: [facility] ENDOSCOPY;  Service: Gastroenterology;  Laterality: N/A;   BASAL CELL CARCINOMA EXCISION  2001   "off my nose, left side"   BIOPSY  05/08/2021   Procedure: BIOPSY;  Surgeon: Kerin Salen, MD;  Location: [facility] ENDOSCOPY;  Service: Gastroenterology;;   BLEPHAROPLASTY Bilateral    CATARACT EXTRACTION W/ INTRAOCULAR LENS  IMPLANT, BILATERAL Bilateral 1990's   COLONOSCOPY WITH PROPOFOL N/A 05/04/2022   Procedure: COLONOSCOPY WITH PROPOFOL;  Surgeon: Kerin Salen, MD;  Location: [facility] ENDOSCOPY;  Service: Gastroenterology;  Laterality: N/A;   ESOPHAGOGASTRODUODENOSCOPY (EGD) WITH PROPOFOL N/A 05/08/2021   Procedure: ESOPHAGOGASTRODUODENOSCOPY (EGD) WITH PROPOFOL;  Surgeon: Kerin Salen, MD;  Location: [facility] ENDOSCOPY;  Service: Gastroenterology;  Laterality: N/A;   ESOPHAGOGASTRODUODENOSCOPY (EGD) WITH PROPOFOL N/A 01/11/2022   Procedure: ESOPHAGOGASTRODUODENOSCOPY (EGD) WITH PROPOFOL;  Surgeon: Vida Rigger, MD;  Location: [facility] ENDOSCOPY;  Service: Gastroenterology;  Laterality: N/A;   ESOPHAGOGASTRODUODENOSCOPY (EGD) WITH PROPOFOL N/A 01/06/2023   Procedure: ESOPHAGOGASTRODUODENOSCOPY (EGD) WITH PROPOFOL;  Surgeon: Vida Rigger, MD;  Location: [facility] ENDOSCOPY;  Service: Gastroenterology;  Laterality: N/A;   EYE SURGERY Bilateral    "to improve vision after cataract OR"   IR ANGIOGRAM FOLLOW UP STUDY  12/19/2020   IR ANGIOGRAM SELECTIVE EACH ADDITIONAL VESSEL  12/19/2020   IR ANGIOGRAM SELECTIVE EACH ADDITIONAL VESSEL  12/19/2020   IR ANGIOGRAM VISCERAL SELECTIVE  12/19/2020   IR EMBO ART  VEN HEMORR LYMPH EXTRAV  INC GUIDE ROADMAPPING  12/19/2020   IR US GUIDE VASC ACCESS RIGHT  12/19/2020   JOINT REPLACEMENT     POLYPECTOMY  05/04/2022   Procedure: POLYPECTOMY;  Surgeon: Kerin Salen, MD;  Location: [facility] ENDOSCOPY;  Service: Gastroenterology;;   STAPEDES SURGERY Left    "scraped stapedes because it was sticking when it wasn't suppose to"   TONSILLECTOMY AND ADENOIDECTOMY  1946   TOTAL KNEE ARTHROPLASTY Left ~ 2008   TOTAL KNEE ARTHROPLASTY  12/20/2011   Procedure: TOTAL KNEE ARTHROPLASTY;  Surgeon: Verlee Rossetti, MD;  Location: [facility] OR;  Service: Orthopedics;  Laterality: Right;  Right Total Knee Arthroplasty   TUBAL LIGATION  1980's    Prior to Admission medications   Medication Sig Start Date End Date Taking? Authorizing Provider  acetaminophen (TYLENOL) 500 MG tablet Take 1,000 mg by mouth every 8 (eight) hours as needed for moderate pain (pain score 4-6).   Yes [provider]  acetaminophen (TYLENOL) 650 MG CR tablet Take 1,300 mg by mouth at bedtime.   Yes [provider]  albuterol (VENTOLIN HFA) 108 (90 Base) MCG/ACT inhaler Inhale 2 puffs into the lungs every 4 (four) hours as needed for wheezing or shortness of breath.   Yes [provider]  Ascorbic Acid (VITAMIN C) 1000 MG tablet Take 1,000 mg by mouth every morning.   Yes [provider]  cetirizine (ZYRTEC) 5 MG chewable tablet Chew 5 mg by mouth daily as needed for allergies.   Yes [provider]  Cholecalciferol (VITAMIN D3) 50 MCG (2000 UT) TABS Take 2,000 Units by mouth every morning.   Yes [provider]  docusate sodium (COLACE) 100 MG capsule Take 200 mg by mouth at bedtime.   Yes [provider]  dorzolamide-timolol (COSOPT) 22.3-6.8 MG/ML ophthalmic solution Place 1 drop into both eyes 2 (two) times daily.   Yes [provider]  ferrous gluconate (FERGON) 240 (27 FE) MG tablet Take 240 mg by mouth every morning. Take without food   Yes [provider]  fluticasone (FLONASE) 50 MCG/ACT nasal spray Place 1 spray into both nostrils daily as needed for allergies or rhinitis.   Yes [provider]  latanoprost  (XALATAN) 0.005 % ophthalmic solution Place 1 drop into both eyes at bedtime.   Yes [provider]  LIDOCAINE EX Apply 1 application  topically in the morning and at bedtime. Right shoulder to elbow   Yes [provider]  LIDOCAINE EX Apply 1 Application topically daily. Right Ankle.   Yes [provider]  METOPROLOL TARTRATE PO Take 6.25 mg by mouth in the morning and at bedtime.   Yes [provider]  Multiple Vitamins-Minerals (PRESERVISION AREDS 2) CAPS Take 1 capsule by mouth 2 (two) times daily.   Yes [provider]  Netarsudil Dimesylate (RHOPRESSA) 0.02 % SOLN Place 1 drop into both eyes at bedtime.   Yes [provider]  oxybutynin (DITROPAN-XL) 5 MG 24 hr tablet Take 5 mg by mouth at bedtime. 11/22/22  Yes [provider]  pantoprazole (PROTONIX) 40 MG tablet Take 40 mg by mouth 2 (two) times daily.   Yes [provider]  polyethylene glycol (MIRALAX / GLYCOLAX) 17 g packet Take 8.5 g by mouth daily as needed (constipation).   Yes [provider]  Probiotic Product (ALIGN) 4 MG CAPS Take 4 mg by mouth at bedtime.   Yes [provider]  rOPINIRole (REQUIP) 0.5 MG tablet Take 0.5 mg by mouth as needed.  Yes [provider]  sertraline (ZOLOFT) 50 MG tablet Take 1.5 tablets (75 mg total) by mouth daily. 01/07/23  Yes Rhetta Mura, MD  simvastatin (ZOCOR) 20 MG tablet Take 20 mg by mouth every evening.   Yes [provider]  Sodium Fluoride (PREVIDENT 5000 BOOSTER PLUS) 1.1 % PSTE Place 1 Application onto teeth See admin instructions. Brush on teeth with a toothbrush after evening mouth care. Spit out excess and do not rinse.   Yes [provider]  umeclidinium-vilanterol (ANORO ELLIPTA) 62.5-25 MCG/ACT AEPB Inhale 1 puff into the lungs every morning.   Yes [provider]  zinc oxide 20 % ointment Apply 1 Application topically See admin instructions. Apply  topically to buttocks after every incontinent episode and as needed for redness   Yes [provider]  alendronate (FOSAMAX) 70 MG tablet Take 70 mg by mouth every Sunday. Take with a full glass of water on an empty stomach.    [provider]  potassium chloride SA (KLOR-CON M) 20 MEQ tablet Take 20 mEq by mouth 2 (two) times daily. Patient not taking: Reported on 04/19/2023 01/22/23   [provider]    Current Facility-Administered Medications  Medication Dose Route Frequency Provider Last Rate Last Admin   acetaminophen (TYLENOL) tablet 650 mg  650 mg Oral Q6H PRN Bobette Mo, MD   650 mg at 04/19/23 2249   Or   acetaminophen (TYLENOL) suppository 650 mg  650 mg Rectal Q6H PRN Bobette Mo, MD       Chlorhexidine Gluconate Cloth 2 % PADS 6 each  6 each Topical Daily Bobette Mo, MD       dorzolamide-timolol (COSOPT) 2-0.5 % ophthalmic solution 1 drop  1 drop Both Eyes BID Bobette Mo, MD   1 drop at 04/20/23 1006   latanoprost (XALATAN) 0.005 % ophthalmic solution 1 drop  1 drop Both Eyes QHS Bobette Mo, MD   1 drop at 04/19/23 2124   Netarsudil Dimesylate 0.02 % SOLN 1 drop  1 drop Both Eyes QHS Bobette Mo, MD       Oral care mouth rinse  15 mL Mouth Rinse PRN Bobette Mo, MD       oxybutynin (DITROPAN-XL) 24 hr tablet 5 mg  5 mg Oral QHS Bobette Mo, MD   5 mg at 04/19/23 2123   pantoprazole (PROTONIX) injection 40 mg  40 mg Intravenous Q12H Gareth [facility], PA-C   40 mg at 04/20/23 0957   potassium chloride 10 mEq in 100 mL IVPB  10 mEq Intravenous Q1 Hr x 2 Kc, Ramesh, MD       potassium chloride SA (KLOR-CON M) CR tablet 40 mEq  40 mEq Oral Once Kc, Dayna Barker, MD       prochlorperazine (COMPAZINE) injection 5 mg  5 mg Intravenous Q4H PRN Bobette Mo, MD       sertraline (ZOLOFT) tablet 75 mg  75 mg Oral Daily Bobette Mo, MD   75 mg at 04/19/23 2123   simvastatin (ZOCOR) tablet 20  mg  20 mg Oral QPM Bobette Mo, MD   20 mg at 04/19/23 2123   umeclidinium-vilanterol (ANORO ELLIPTA) 62.5-25 MCG/ACT 1 puff  1 puff Inhalation q morning Bobette Mo, MD   1 puff at 04/20/23 1113    Allergies as of 04/19/2023 - Review Complete 04/19/2023  Allergen Reaction Noted   Morphine and codeine Itching 11/29/2011   Aspirin Other (See  Comments) 10/22/2020   Duloxetine hcl Other (See Comments) 10/22/2020   Tymlos [abaloparatide] Other (See Comments) 10/22/2020   Ventolin [albuterol] Other (See Comments) 12/25/2020   Cefadroxil Hives 12/11/2011    Family History  Problem Relation Age of Onset   Heart attack Mother    Heart disease Father    Heart failure Father    Bladder Cancer Father    Supraventricular tachycardia Sister    Hypertension Sister     Social History   Socioeconomic History   Marital status: Married    Spouse name: Rosanne Ashing    Number of children: 3   Years of education: College   Highest education level: Not on file  Occupational History   Occupation: Retired Runner, broadcasting/film/video  Tobacco Use   Smoking status: Former    Current packs/day: 0.00    Average packs/day: 0.8 packs/day for 37.0 years (27.8 ttl pk-yrs)    Types: Cigarettes    Start date: 07/02/1955    Quit date: 07/01/1992    Years since quitting: 30.8   Smokeless tobacco: Never  Vaping Use   Vaping status: Never Used  Substance and Sexual Activity   Alcohol use: Yes    Alcohol/week: 7.0 standard drinks of alcohol    Types: 7 Glasses of wine per week    Comment: 09/05/2017  "6 oz wine, 5 days/wk"   Drug use: No    Types: Hydrocodone   Sexual activity: Not Currently  Other Topics Concern   Not on file  Social History Narrative   1-2 cups of coffee a day    Social Determinants of Health   Financial Resource Strain: Low Risk  (04/04/2023)   Overall Financial Resource Strain (CARDIA)    Difficulty of Paying Living Expenses: Not hard at all  Food Insecurity: No Food Insecurity (04/04/2023)    Hunger Vital Sign    Worried About Running Out of Food in the Last Year: Never true    Ran Out of Food in the Last Year: Never true  Transportation Needs: No Transportation Needs (04/04/2023)   PRAPARE - Administrator, Civil Service (Medical): No    Lack of Transportation (Non-Medical): No  Physical Activity: Insufficiently Active (04/04/2023)   Exercise Vital Sign    Days of Exercise per Week: 2 days    Minutes of Exercise per Session: 20 min  Stress: No Stress Concern Present (04/04/2023)   [employer] of Occupational Health - Occupational Stress Questionnaire    Feeling of Stress : Only a little  Social Connections: Moderately Isolated (04/04/2023)   Social Connection and Isolation Panel [NHANES]    Frequency of Communication with Friends and Family: More than three times a week    Frequency of Social Gatherings with Friends and Family: Twice a week    Attends Religious Services: Never    Database administrator or Organizations: No    Attends Banker Meetings: Never    Marital Status: Married  Catering manager Violence: Not At Risk (04/04/2023)   Humiliation, Afraid, Rape, and Kick questionnaire    Fear of Current or Ex-Partner: No    Emotionally Abused: No    Physically Abused: No    Sexually Abused: No    Review of Systems: As per HPI Physical Exam: Vital signs in last 24 hours: Temp:  [97.5 F (36.4 C)-98.6 F (37 C)] 98 F (36.7 C) (10/20 1013) Pulse Rate:  [74-135] 91 (10/20 1000) Resp:  [16-28] 22 (10/20 1000) BP: (90-134)/(29-82) 124/39 (10/20 1000)  SpO2:  [91 %-100 %] 98 % (10/20 1113) Last BM Date :  (04/19/23)  General:   Elderly, pleasant  Head:  Normocephalic and atraumatic. Eyes: Prominent pallor Ears:  Normal auditory acuity. Nose:  No deformity, discharge,  or lesions. Mouth:  No deformity or lesions.  Oropharynx pink & moist. Neck:  Supple; no masses or thyromegaly. Lungs:  Clear throughout to auscultation.   No  wheezes, crackles, or rhonchi. No acute distress. Heart:  Regular rate and rhythm; no murmurs, clicks, rubs,  or gallops. Extremities:  Without clubbing or edema. Neurologic:  Alert and  oriented x4;  grossly normal neurologically. Skin:  Intact without significant lesions or rashes. Psych:  Alert and cooperative. Normal mood and affect. Abdomen:  Soft, nontender and nondistended. No masses, hepatosplenomegaly or hernias noted. Normal bowel sounds, without guarding, and without rebound.         Lab Results: Recent Labs    04/19/23 1144 04/19/23 1227 04/19/23 2033 04/20/23 0739  WBC 7.9  --   --  9.2  HGB 5.9* 6.1* 6.7* 8.5*  HCT 19.2* 18.0* 21.9* 26.0*  PLT 154  --   --  117*   BMET Recent Labs    04/19/23 1144 04/19/23 1227 04/20/23 0739  NA 139 141 142  K 3.5 3.6 3.1*  CL 106 104 113*  CO2 25  --  25  GLUCOSE 128* 124* 108*  BUN 24* 22 24*  CREATININE 0.49 0.60 0.31*  CALCIUM 8.5*  --  7.6*   LFT Recent Labs    04/20/23 0739  PROT 5.0*  ALBUMIN 3.0*  AST 16  ALT 9  ALKPHOS 27*  BILITOT 1.1   PT/INR Recent Labs    04/19/23 1144  LABPROT 16.2*  INR 1.3*    Studies/Results: CT ANGIO GI BLEED  Result Date: 04/19/2023 CLINICAL DATA:  GI bleed.  Anemia EXAM: CTA ABDOMEN AND PELVIS WITHOUT AND WITH CONTRAST TECHNIQUE: Multidetector CT imaging of the abdomen and pelvis was performed using the standard protocol during bolus administration of intravenous contrast. Multiplanar reconstructed images and MIPs were obtained and reviewed to evaluate the vascular anatomy. RADIATION DOSE REDUCTION: This exam was performed according to the departmental dose-optimization program which includes automated exposure control, adjustment of the mA and/or kV according to patient size and/or use of iterative reconstruction technique. CONTRAST:  OMNIPAQUE IOHEXOL 350 MG/ML SOLN COMPARISON:  05/03/2022 FINDINGS: VASCULAR Aorta: Normal caliber aorta without aneurysm, dissection,  vasculitis or significant stenosis. Moderate atherosclerosis. Celiac: Patent without evidence of aneurysm, dissection, vasculitis or significant stenosis. SMA: Patent without evidence of aneurysm, dissection, vasculitis or significant stenosis. Renals: Atherosclerotic plaque at the ostia of the bilateral renal arteries demonstrates right greater than left proximal stenosis, similar to prior. No aneurysm or evidence of fibromuscular dysplasia. IMA: Patent. Inflow: Patent without evidence of aneurysm, dissection, vasculitis or significant stenosis. Proximal Outflow: Bilateral common femoral and visualized portions of the superficial and profunda femoral arteries are patent without evidence of aneurysm, dissection, vasculitis or significant stenosis. Veins: Major venous structures of the abdomen are patent. Review of the MIP images confirms the above findings. NON-VASCULAR Lower chest: No acute abnormality. Hepatobiliary: No focal liver abnormality. Small amount of layering sludge or small stones within the gallbladder. No gallbladder wall thickening or pericholecystic inflammatory changes by CT. No biliary dilatation. Pancreas: Unremarkable. No pancreatic ductal dilatation or surrounding inflammatory changes. Spleen: Normal in size without focal abnormality. Adrenals/Urinary Tract: Unremarkable adrenal glands. Unchanged left caliceal diverticulum. Kidneys enhance symmetrically. No stone or  hydronephrosis. Urinary bladder within normal limits. Stomach/Bowel: Stomach within normal limits. No abnormally dilated loops of bowel. Normal appendix in the right lower quadrant. Severe colonic diverticulosis. No focal bowel wall thickening or inflammatory changes. No contrast accumulation within the bowel lumen is identified on multi phasic imaging to suggest active GI bleed by CT. Lymphatic: No abdominopelvic lymphadenopathy. Reproductive: Uterus and bilateral adnexa are unremarkable. Other: No free air or free fluid.  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 No acute bony abnormalities. IMPRESSION: 1. No evidence of active GI bleed by CT. If further imaging evaluation is clinically warranted, a nuclear medicine GI bleeding scan could be performed. 2. Severe colonic diverticulosis without evidence of acute diverticulitis. 3. Cholelithiasis without evidence of acute cholecystitis. 4. 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 5. Aortic atherosclerosis (ICD10-I70.0). Electronically Signed   By: Duanne Guess D.O.   On: 04/19/2023 15:46    Impression: Anemia, melena CT angio: No evidence of active GI bleed, severe colonic diverticulosis, cholelithiasis without cholecystitis, L1 body compression fracture  Plan: EGD with Dr. Bosie Clos tomorrow. If EGD is unremarkable consider small bowel PillCam placement during endoscopy. I will start the patient on clear liquid diet and keep her n.p.o. postmidnight. Continue pantoprazole 40 mg twice a day.   LOS: 1 day   Kerin Salen, MD  04/20/2023, 11:40 AM

## 2023-04-20 NOTE — Hospital Course (Addendum)
82 y.o.f w/ anxiety,depression, chronic diastolic heart failure, osteoarthritis, glaucoma, hyperlipidemia, hypertension, pulmonary nodule, restless leg syndrome, osteoporosis, COPD, aortic arthrosclerosis, GERD, history of diverticular bleed with embolization, UGI bleed, chronic iron deficiency anemia due to chronic blood loss with multiple admissions due to GI bleed most recently seen from 03/27/2023 until 03/29/2023 with recent EGD in August 2024 that showed gastritis who presented to the ED from facility due to low hemoglobin level associated with several melena episodes.  In MV:HQIONG Temp-98.1 F,HR-78,RR 16, BP 111/48 mmHg O2 sat 98% on RA.The patient received 500 mL normal saline bolus.Labs:WBC-7.9,HB-5.9 g/dL with an MCV of 295.2 fL and platelets 554.  PT 16.2 and INR 1.3.FOBT positive.CMP showed normal electrolytes after calcium correction, glucose 128, BUN 24 and creatinine 0.49 mg/deciliter.  AST 31 units/L.  Total protein was 5.8 and albumin 3.4 g/dL.The rest of the LFTs were unremarkable. CTA GI bleed>no evidence of active GI bleed by CT,Cholelithiasis without cholecystitis.  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  Aortic atherosclerosis. 2u PRBC ordered and admitted. Seen by GI. EGD done 10/21> Acute gastritis,multiple gastric polyps.Normal examined duodenum.  CamPill placed and monitored in SDU.

## 2023-04-20 NOTE — H&P (View-Only) (Signed)
[facility] Consult  Referring Provider: [facility] hospitalist Primary Care Physician:  Octavia Heir, NP Primary Gastroenterologist: Dr. Ewing Schlein   Reason for Consultation: Black stools  HPI: [name] is a 82 y.o. female patient presented to the ED with multiple episode of black, dark stools.  In the ER, she was found to have a hemoglobin of 5.9, has needed 3 units PRBC transfusion with subsequent hemoglobin being 8.5 today. Last bowel movement was today morning described as liquid black dark brown. Denies abdominal pain, nausea, vomiting, acid reflux or heartburn. She complains of occasional difficulty swallowing solids. She denies use of blood thinners, antiplatelets, NSAIDs, Goody powders, aspirin.  Previous GI workup: EGD, Dr. Ewing Schlein, melena, inpatient, 01/06/2023: Polyps in gastric body, mild inflammation, no bleeding Colonoscopy, Dr. Marca Ancona, inpatient, hematochezia: Dark blood found in entire colon, diverticulosis in sigmoid, descending, splenic flexure, transverse, hepatic flexure and ascending colon, 2 sessile serrated adenomas removed EGD, melena, inpatient, Dr. Ewing Schlein, 01/11/2022: Tiny hiatal hernia, food residue, otherwise unremarkable EGD, Dr. Marca Ancona, inpatient, dysphagia, dysmotility, esophageal stricture on barium swallow, 05/08/2021: Candida esophagitis, empiric balloon dilation performed, gastric polyps, otherwise unremarkable  Past Medical History:  Diagnosis Date   Anxiety    Arthritis    "bilateral knees, shoulders, elbows; neck, pretty widespread" (09/05/2017)   BPPV (benign paroxysmal positional vertigo)    Depression    GERD (gastroesophageal reflux disease)    Glaucoma, both eyes    Headache    "probably 2/month" (09/05/2017)   History of blood transfusion ~ 2008   "related to LGIB"   Hyperlipemia    Lower GI bleeding ~ 2008; 09/05/2017   "had to have blood transfusion"   Macular degeneration, bilateral    Osteopenia    Seasonal allergies    Skin cancer, basal  cell 2001   "off my nose, left side"   Sleeping excessive    Tinnitus of both ears     Past Surgical History:  Procedure Laterality Date   BALLOON DILATION N/A 05/08/2021   Procedure: BALLOON DILATION;  Surgeon: Kerin Salen, MD;  Location: [facility] ENDOSCOPY;  Service: Gastroenterology;  Laterality: N/A;   BASAL CELL CARCINOMA EXCISION  2001   "off my nose, left side"   BIOPSY  05/08/2021   Procedure: BIOPSY;  Surgeon: Kerin Salen, MD;  Location: [facility] ENDOSCOPY;  Service: Gastroenterology;;   BLEPHAROPLASTY Bilateral    CATARACT EXTRACTION W/ INTRAOCULAR LENS  IMPLANT, BILATERAL Bilateral 1990's   COLONOSCOPY WITH PROPOFOL N/A 05/04/2022   Procedure: COLONOSCOPY WITH PROPOFOL;  Surgeon: Kerin Salen, MD;  Location: [facility] ENDOSCOPY;  Service: Gastroenterology;  Laterality: N/A;   ESOPHAGOGASTRODUODENOSCOPY (EGD) WITH PROPOFOL N/A 05/08/2021   Procedure: ESOPHAGOGASTRODUODENOSCOPY (EGD) WITH PROPOFOL;  Surgeon: Kerin Salen, MD;  Location: [facility] ENDOSCOPY;  Service: Gastroenterology;  Laterality: N/A;   ESOPHAGOGASTRODUODENOSCOPY (EGD) WITH PROPOFOL N/A 01/11/2022   Procedure: ESOPHAGOGASTRODUODENOSCOPY (EGD) WITH PROPOFOL;  Surgeon: Vida Rigger, MD;  Location: [facility] ENDOSCOPY;  Service: Gastroenterology;  Laterality: N/A;   ESOPHAGOGASTRODUODENOSCOPY (EGD) WITH PROPOFOL N/A 01/06/2023   Procedure: ESOPHAGOGASTRODUODENOSCOPY (EGD) WITH PROPOFOL;  Surgeon: Vida Rigger, MD;  Location: [facility] ENDOSCOPY;  Service: Gastroenterology;  Laterality: N/A;   EYE SURGERY Bilateral    "to improve vision after cataract OR"   IR ANGIOGRAM FOLLOW UP STUDY  12/19/2020   IR ANGIOGRAM SELECTIVE EACH ADDITIONAL VESSEL  12/19/2020   IR ANGIOGRAM SELECTIVE EACH ADDITIONAL VESSEL  12/19/2020   IR ANGIOGRAM VISCERAL SELECTIVE  12/19/2020   IR EMBO ART  VEN HEMORR LYMPH EXTRAV  INC GUIDE ROADMAPPING  12/19/2020   IR US GUIDE VASC ACCESS RIGHT  12/19/2020   JOINT REPLACEMENT     POLYPECTOMY  05/04/2022   Procedure: POLYPECTOMY;  Surgeon: Kerin Salen, MD;  Location: [facility] ENDOSCOPY;  Service: Gastroenterology;;   STAPEDES SURGERY Left    "scraped stapedes because it was sticking when it wasn't suppose to"   TONSILLECTOMY AND ADENOIDECTOMY  1946   TOTAL KNEE ARTHROPLASTY Left ~ 2008   TOTAL KNEE ARTHROPLASTY  12/20/2011   Procedure: TOTAL KNEE ARTHROPLASTY;  Surgeon: Verlee Rossetti, MD;  Location: [facility] OR;  Service: Orthopedics;  Laterality: Right;  Right Total Knee Arthroplasty   TUBAL LIGATION  1980's    Prior to Admission medications   Medication Sig Start Date End Date Taking? Authorizing Provider  acetaminophen (TYLENOL) 500 MG tablet Take 1,000 mg by mouth every 8 (eight) hours as needed for moderate pain (pain score 4-6).   Yes [provider]  acetaminophen (TYLENOL) 650 MG CR tablet Take 1,300 mg by mouth at bedtime.   Yes [provider]  albuterol (VENTOLIN HFA) 108 (90 Base) MCG/ACT inhaler Inhale 2 puffs into the lungs every 4 (four) hours as needed for wheezing or shortness of breath.   Yes [provider]  Ascorbic Acid (VITAMIN C) 1000 MG tablet Take 1,000 mg by mouth every morning.   Yes [provider]  cetirizine (ZYRTEC) 5 MG chewable tablet Chew 5 mg by mouth daily as needed for allergies.   Yes [provider]  Cholecalciferol (VITAMIN D3) 50 MCG (2000 UT) TABS Take 2,000 Units by mouth every morning.   Yes [provider]  docusate sodium (COLACE) 100 MG capsule Take 200 mg by mouth at bedtime.   Yes [provider]  dorzolamide-timolol (COSOPT) 22.3-6.8 MG/ML ophthalmic solution Place 1 drop into both eyes 2 (two) times daily.   Yes [provider]  ferrous gluconate (FERGON) 240 (27 FE) MG tablet Take 240 mg by mouth every morning. Take without food   Yes [provider]  fluticasone (FLONASE) 50 MCG/ACT nasal spray Place 1 spray into both nostrils daily as needed for allergies or rhinitis.   Yes [provider]  latanoprost  (XALATAN) 0.005 % ophthalmic solution Place 1 drop into both eyes at bedtime.   Yes [provider]  LIDOCAINE EX Apply 1 application  topically in the morning and at bedtime. Right shoulder to elbow   Yes [provider]  LIDOCAINE EX Apply 1 Application topically daily. Right Ankle.   Yes [provider]  METOPROLOL TARTRATE PO Take 6.25 mg by mouth in the morning and at bedtime.   Yes [provider]  Multiple Vitamins-Minerals (PRESERVISION AREDS 2) CAPS Take 1 capsule by mouth 2 (two) times daily.   Yes [provider]  Netarsudil Dimesylate (RHOPRESSA) 0.02 % SOLN Place 1 drop into both eyes at bedtime.   Yes [provider]  oxybutynin (DITROPAN-XL) 5 MG 24 hr tablet Take 5 mg by mouth at bedtime. 11/22/22  Yes [provider]  pantoprazole (PROTONIX) 40 MG tablet Take 40 mg by mouth 2 (two) times daily.   Yes [provider]  polyethylene glycol (MIRALAX / GLYCOLAX) 17 g packet Take 8.5 g by mouth daily as needed (constipation).   Yes [provider]  Probiotic Product (ALIGN) 4 MG CAPS Take 4 mg by mouth at bedtime.   Yes [provider]  rOPINIRole (REQUIP) 0.5 MG tablet Take 0.5 mg by mouth as needed.  Yes [provider]  sertraline (ZOLOFT) 50 MG tablet Take 1.5 tablets (75 mg total) by mouth daily. 01/07/23  Yes Rhetta Mura, MD  simvastatin (ZOCOR) 20 MG tablet Take 20 mg by mouth every evening.   Yes [provider]  Sodium Fluoride (PREVIDENT 5000 BOOSTER PLUS) 1.1 % PSTE Place 1 Application onto teeth See admin instructions. Brush on teeth with a toothbrush after evening mouth care. Spit out excess and do not rinse.   Yes [provider]  umeclidinium-vilanterol (ANORO ELLIPTA) 62.5-25 MCG/ACT AEPB Inhale 1 puff into the lungs every morning.   Yes [provider]  zinc oxide 20 % ointment Apply 1 Application topically See admin instructions. Apply  topically to buttocks after every incontinent episode and as needed for redness   Yes [provider]  alendronate (FOSAMAX) 70 MG tablet Take 70 mg by mouth every Sunday. Take with a full glass of water on an empty stomach.    [provider]  potassium chloride SA (KLOR-CON M) 20 MEQ tablet Take 20 mEq by mouth 2 (two) times daily. Patient not taking: Reported on 04/19/2023 01/22/23   [provider]    Current Facility-Administered Medications  Medication Dose Route Frequency Provider Last Rate Last Admin   acetaminophen (TYLENOL) tablet 650 mg  650 mg Oral Q6H PRN Bobette Mo, MD   650 mg at 04/19/23 2249   Or   acetaminophen (TYLENOL) suppository 650 mg  650 mg Rectal Q6H PRN Bobette Mo, MD       Chlorhexidine Gluconate Cloth 2 % PADS 6 each  6 each Topical Daily Bobette Mo, MD       dorzolamide-timolol (COSOPT) 2-0.5 % ophthalmic solution 1 drop  1 drop Both Eyes BID Bobette Mo, MD   1 drop at 04/20/23 1006   latanoprost (XALATAN) 0.005 % ophthalmic solution 1 drop  1 drop Both Eyes QHS Bobette Mo, MD   1 drop at 04/19/23 2124   Netarsudil Dimesylate 0.02 % SOLN 1 drop  1 drop Both Eyes QHS Bobette Mo, MD       Oral care mouth rinse  15 mL Mouth Rinse PRN Bobette Mo, MD       oxybutynin (DITROPAN-XL) 24 hr tablet 5 mg  5 mg Oral QHS Bobette Mo, MD   5 mg at 04/19/23 2123   pantoprazole (PROTONIX) injection 40 mg  40 mg Intravenous Q12H Gareth [facility], PA-C   40 mg at 04/20/23 0957   potassium chloride 10 mEq in 100 mL IVPB  10 mEq Intravenous Q1 Hr x 2 Kc, Ramesh, MD       potassium chloride SA (KLOR-CON M) CR tablet 40 mEq  40 mEq Oral Once Kc, Dayna Barker, MD       prochlorperazine (COMPAZINE) injection 5 mg  5 mg Intravenous Q4H PRN Bobette Mo, MD       sertraline (ZOLOFT) tablet 75 mg  75 mg Oral Daily Bobette Mo, MD   75 mg at 04/19/23 2123   simvastatin (ZOCOR) tablet 20  mg  20 mg Oral QPM Bobette Mo, MD   20 mg at 04/19/23 2123   umeclidinium-vilanterol (ANORO ELLIPTA) 62.5-25 MCG/ACT 1 puff  1 puff Inhalation q morning Bobette Mo, MD   1 puff at 04/20/23 1113    Allergies as of 04/19/2023 - Review Complete 04/19/2023  Allergen Reaction Noted   Morphine and codeine Itching 11/29/2011   Aspirin Other (See  Comments) 10/22/2020   Duloxetine hcl Other (See Comments) 10/22/2020   Tymlos [abaloparatide] Other (See Comments) 10/22/2020   Ventolin [albuterol] Other (See Comments) 12/25/2020   Cefadroxil Hives 12/11/2011    Family History  Problem Relation Age of Onset   Heart attack Mother    Heart disease Father    Heart failure Father    Bladder Cancer Father    Supraventricular tachycardia Sister    Hypertension Sister     Social History   Socioeconomic History   Marital status: Married    Spouse name: Rosanne Ashing    Number of children: 3   Years of education: College   Highest education level: Not on file  Occupational History   Occupation: Retired Runner, broadcasting/film/video  Tobacco Use   Smoking status: Former    Current packs/day: 0.00    Average packs/day: 0.8 packs/day for 37.0 years (27.8 ttl pk-yrs)    Types: Cigarettes    Start date: 07/02/1955    Quit date: 07/01/1992    Years since quitting: 30.8   Smokeless tobacco: Never  Vaping Use   Vaping status: Never Used  Substance and Sexual Activity   Alcohol use: Yes    Alcohol/week: 7.0 standard drinks of alcohol    Types: 7 Glasses of wine per week    Comment: 09/05/2017  "6 oz wine, 5 days/wk"   Drug use: No    Types: Hydrocodone   Sexual activity: Not Currently  Other Topics Concern   Not on file  Social History Narrative   1-2 cups of coffee a day    Social Determinants of Health   Financial Resource Strain: Low Risk  (04/04/2023)   Overall Financial Resource Strain (CARDIA)    Difficulty of Paying Living Expenses: Not hard at all  Food Insecurity: No Food Insecurity (04/04/2023)    Hunger Vital Sign    Worried About Running Out of Food in the Last Year: Never true    Ran Out of Food in the Last Year: Never true  Transportation Needs: No Transportation Needs (04/04/2023)   PRAPARE - Administrator, Civil Service (Medical): No    Lack of Transportation (Non-Medical): No  Physical Activity: Insufficiently Active (04/04/2023)   Exercise Vital Sign    Days of Exercise per Week: 2 days    Minutes of Exercise per Session: 20 min  Stress: No Stress Concern Present (04/04/2023)   [employer] of Occupational Health - Occupational Stress Questionnaire    Feeling of Stress : Only a little  Social Connections: Moderately Isolated (04/04/2023)   Social Connection and Isolation Panel [NHANES]    Frequency of Communication with Friends and Family: More than three times a week    Frequency of Social Gatherings with Friends and Family: Twice a week    Attends Religious Services: Never    Database administrator or Organizations: No    Attends Banker Meetings: Never    Marital Status: Married  Catering manager Violence: Not At Risk (04/04/2023)   Humiliation, Afraid, Rape, and Kick questionnaire    Fear of Current or Ex-Partner: No    Emotionally Abused: No    Physically Abused: No    Sexually Abused: No    Review of Systems: As per HPI Physical Exam: Vital signs in last 24 hours: Temp:  [97.5 F (36.4 C)-98.6 F (37 C)] 98 F (36.7 C) (10/20 1013) Pulse Rate:  [74-135] 91 (10/20 1000) Resp:  [16-28] 22 (10/20 1000) BP: (90-134)/(29-82) 124/39 (10/20 1000)  SpO2:  [91 %-100 %] 98 % (10/20 1113) Last BM Date :  (04/19/23)  General:   Elderly, pleasant  Head:  Normocephalic and atraumatic. Eyes: Prominent pallor Ears:  Normal auditory acuity. Nose:  No deformity, discharge,  or lesions. Mouth:  No deformity or lesions.  Oropharynx pink & moist. Neck:  Supple; no masses or thyromegaly. Lungs:  Clear throughout to auscultation.   No  wheezes, crackles, or rhonchi. No acute distress. Heart:  Regular rate and rhythm; no murmurs, clicks, rubs,  or gallops. Extremities:  Without clubbing or edema. Neurologic:  Alert and  oriented x4;  grossly normal neurologically. Skin:  Intact without significant lesions or rashes. Psych:  Alert and cooperative. Normal mood and affect. Abdomen:  Soft, nontender and nondistended. No masses, hepatosplenomegaly or hernias noted. Normal bowel sounds, without guarding, and without rebound.         Lab Results: Recent Labs    04/19/23 1144 04/19/23 1227 04/19/23 2033 04/20/23 0739  WBC 7.9  --   --  9.2  HGB 5.9* 6.1* 6.7* 8.5*  HCT 19.2* 18.0* 21.9* 26.0*  PLT 154  --   --  117*   BMET Recent Labs    04/19/23 1144 04/19/23 1227 04/20/23 0739  NA 139 141 142  K 3.5 3.6 3.1*  CL 106 104 113*  CO2 25  --  25  GLUCOSE 128* 124* 108*  BUN 24* 22 24*  CREATININE 0.49 0.60 0.31*  CALCIUM 8.5*  --  7.6*   LFT Recent Labs    04/20/23 0739  PROT 5.0*  ALBUMIN 3.0*  AST 16  ALT 9  ALKPHOS 27*  BILITOT 1.1   PT/INR Recent Labs    04/19/23 1144  LABPROT 16.2*  INR 1.3*    Studies/Results: CT ANGIO GI BLEED  Result Date: 04/19/2023 CLINICAL DATA:  GI bleed.  Anemia EXAM: CTA ABDOMEN AND PELVIS WITHOUT AND WITH CONTRAST TECHNIQUE: Multidetector CT imaging of the abdomen and pelvis was performed using the standard protocol during bolus administration of intravenous contrast. Multiplanar reconstructed images and MIPs were obtained and reviewed to evaluate the vascular anatomy. RADIATION DOSE REDUCTION: This exam was performed according to the departmental dose-optimization program which includes automated exposure control, adjustment of the mA and/or kV according to patient size and/or use of iterative reconstruction technique. CONTRAST:  OMNIPAQUE IOHEXOL 350 MG/ML SOLN COMPARISON:  05/03/2022 FINDINGS: VASCULAR Aorta: Normal caliber aorta without aneurysm, dissection,  vasculitis or significant stenosis. Moderate atherosclerosis. Celiac: Patent without evidence of aneurysm, dissection, vasculitis or significant stenosis. SMA: Patent without evidence of aneurysm, dissection, vasculitis or significant stenosis. Renals: Atherosclerotic plaque at the ostia of the bilateral renal arteries demonstrates right greater than left proximal stenosis, similar to prior. No aneurysm or evidence of fibromuscular dysplasia. IMA: Patent. Inflow: Patent without evidence of aneurysm, dissection, vasculitis or significant stenosis. Proximal Outflow: Bilateral common femoral and visualized portions of the superficial and profunda femoral arteries are patent without evidence of aneurysm, dissection, vasculitis or significant stenosis. Veins: Major venous structures of the abdomen are patent. Review of the MIP images confirms the above findings. NON-VASCULAR Lower chest: No acute abnormality. Hepatobiliary: No focal liver abnormality. Small amount of layering sludge or small stones within the gallbladder. No gallbladder wall thickening or pericholecystic inflammatory changes by CT. No biliary dilatation. Pancreas: Unremarkable. No pancreatic ductal dilatation or surrounding inflammatory changes. Spleen: Normal in size without focal abnormality. Adrenals/Urinary Tract: Unremarkable adrenal glands. Unchanged left caliceal diverticulum. Kidneys enhance symmetrically. No stone or  hydronephrosis. Urinary bladder within normal limits. Stomach/Bowel: Stomach within normal limits. No abnormally dilated loops of bowel. Normal appendix in the right lower quadrant. Severe colonic diverticulosis. No focal bowel wall thickening or inflammatory changes. No contrast accumulation within the bowel lumen is identified on multi phasic imaging to suggest active GI bleed by CT. Lymphatic: No abdominopelvic lymphadenopathy. Reproductive: Uterus and bilateral adnexa are unremarkable. Other: No free air or free fluid.  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 No acute bony abnormalities. IMPRESSION: 1. No evidence of active GI bleed by CT. If further imaging evaluation is clinically warranted, a nuclear medicine GI bleeding scan could be performed. 2. Severe colonic diverticulosis without evidence of acute diverticulitis. 3. Cholelithiasis without evidence of acute cholecystitis. 4. 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 5. Aortic atherosclerosis (ICD10-I70.0). Electronically Signed   By: Duanne Guess D.O.   On: 04/19/2023 15:46    Impression: Anemia, melena CT angio: No evidence of active GI bleed, severe colonic diverticulosis, cholelithiasis without cholecystitis, L1 body compression fracture  Plan: EGD with Dr. Bosie Clos tomorrow. If EGD is unremarkable consider small bowel PillCam placement during endoscopy. I will start the patient on clear liquid diet and keep her n.p.o. postmidnight. Continue pantoprazole 40 mg twice a day.   LOS: 1 day   Kerin Salen, MD  04/20/2023, 11:40 AM

## 2023-04-21 ENCOUNTER — Encounter (HOSPITAL_COMMUNITY): Payer: Self-pay

## 2023-04-21 ENCOUNTER — Inpatient Hospital Stay (HOSPITAL_COMMUNITY): Payer: Medicare HMO | Admitting: Anesthesiology

## 2023-04-21 ENCOUNTER — Encounter (HOSPITAL_COMMUNITY)
Admission: EM | Disposition: A | Discharge status: Skilled Nursing Facility | Payer: Self-pay | Source: Skilled Nursing Facility | Attending: Internal Medicine

## 2023-04-21 DIAGNOSIS — K922 Gastrointestinal hemorrhage, unspecified: Secondary | ICD-10-CM | POA: Diagnosis not present

## 2023-04-21 DIAGNOSIS — D649 Anemia, unspecified: Secondary | ICD-10-CM

## 2023-04-21 HISTORY — PX: GIVENS CAPSULE STUDY: SHX5432

## 2023-04-21 HISTORY — PX: ESOPHAGOGASTRODUODENOSCOPY (EGD) WITH PROPOFOL: SHX5813

## 2023-04-21 LAB — BASIC METABOLIC PANEL
Anion gap: 6 (ref 5–15)
BUN: 13 mg/dL (ref 8–23)
CO2: 24 mmol/L (ref 22–32)
Calcium: 7.9 mg/dL — ABNORMAL LOW (ref 8.9–10.3)
Chloride: 110 mmol/L (ref 98–111)
Creatinine, Ser: 0.48 mg/dL (ref 0.44–1.00)
GFR, Estimated: >60 mL/min (ref >60)
Glucose, Bld: 120 mg/dL — ABNORMAL HIGH (ref 70–99)
Potassium: 3 mmol/L — ABNORMAL LOW (ref 3.5–5.1)
Sodium: 140 mmol/L (ref 135–145)

## 2023-04-21 LAB — TYPE AND SCREEN
ABO/RH(D): O POS
Antibody Screen: NEGATIVE
Unit division: 0
Unit division: 0
Unit division: 0

## 2023-04-21 LAB — CBC
HCT: 24.3 % — ABNORMAL LOW (ref 36.0–46.0)
Hemoglobin: 7.8 g/dL — ABNORMAL LOW (ref 12.0–15.0)
MCH: 30.7 pg (ref 26.0–34.0)
MCHC: 32.1 g/dL (ref 30.0–36.0)
MCV: 95.7 fL (ref 80.0–100.0)
Platelets: 124 10*3/uL — ABNORMAL LOW (ref 150–400)
RBC: 2.54 MIL/uL — ABNORMAL LOW (ref 3.87–5.11)
RDW: 18.4 % — ABNORMAL HIGH (ref 11.5–15.5)
WBC: 6 10*3/uL (ref 4.0–10.5)
nRBC: 0.3 % — ABNORMAL HIGH (ref 0.0–0.2)

## 2023-04-21 LAB — BPAM RBC
Blood Product Expiration Date: 202411172359
Blood Product Expiration Date: 202411172359
Blood Product Expiration Date: 202411172359
ISSUE DATE / TIME: 202410191619
ISSUE DATE / TIME: 202410192334
ISSUE DATE / TIME: 202410200243
Unit Type and Rh: 202411172359
Unit Type and Rh: 5100
Unit Type and Rh: 5100
Unit Type and Rh: 5100

## 2023-04-21 LAB — HEMOGLOBIN AND HEMATOCRIT, BLOOD
HCT: 27.7 % — ABNORMAL LOW (ref 36.0–46.0)
Hemoglobin: 8.6 g/dL — ABNORMAL LOW (ref 12.0–15.0)

## 2023-04-21 SURGERY — ESOPHAGOGASTRODUODENOSCOPY (EGD) WITH PROPOFOL
Anesthesia: Monitor Anesthesia Care

## 2023-04-21 MED ORDER — LIDOCAINE 2% (20 MG/ML) 5 ML SYRINGE
INTRAMUSCULAR | Status: DC | PRN
  Administered 2023-04-21: 60 mg via INTRAVENOUS

## 2023-04-21 MED ORDER — SODIUM CHLORIDE 0.9 % IV SOLN: INTRAVENOUS | Status: DC | PRN

## 2023-04-21 MED ORDER — PROPOFOL 10 MG/ML IV BOLUS
INTRAVENOUS | Status: DC | PRN
  Administered 2023-04-21: 30 mg via INTRAVENOUS
  Administered 2023-04-21: 50 mg via INTRAVENOUS
  Administered 2023-04-21: 30 mg via INTRAVENOUS
  Administered 2023-04-21 (×2): 20 mg via INTRAVENOUS

## 2023-04-21 MED ORDER — PHENYLEPHRINE 80 MCG/ML (10ML) SYRINGE FOR IV PUSH (FOR BLOOD PRESSURE SUPPORT)
PREFILLED_SYRINGE | INTRAVENOUS | Status: DC | PRN
  Administered 2023-04-21 (×3): 160 ug via INTRAVENOUS
  Administered 2023-04-21: 80 ug via INTRAVENOUS
  Administered 2023-04-21: 160 ug via INTRAVENOUS

## 2023-04-21 MED ORDER — POTASSIUM CHLORIDE 10 MEQ/100ML IV SOLN
10.0000 meq | INTRAVENOUS | Status: AC
  Administered 2023-04-21 (×6): 10 meq via INTRAVENOUS
  Filled 2023-04-21 (×6): qty 100

## 2023-04-21 MED ORDER — MELATONIN 5 MG PO TABS
5.0000 mg | ORAL_TABLET | Freq: Every day | ORAL | Status: DC
  Administered 2023-04-21 – 2023-04-28 (×8): 5 mg via ORAL
  Filled 2023-04-21 (×8): qty 1

## 2023-04-21 SURGICAL SUPPLY — 16 items
BLOCK BITE 60FR ADLT L/F BLUE (MISCELLANEOUS) ×2 IMPLANT
ELECT REM PT RETURN 9FT ADLT (ELECTROSURGICAL)
ELECTRODE REM PT RTRN 9FT ADLT (ELECTROSURGICAL) IMPLANT
FORCEP RJ3 GP 1.8X160 W-NEEDLE (CUTTING FORCEPS) IMPLANT
FORCEPS BIOP RAD 4 LRG CAP 4 (CUTTING FORCEPS) IMPLANT
NDL SCLEROTHERAPY 25GX240 (NEEDLE) IMPLANT
NEEDLE SCLEROTHERAPY 25GX240 (NEEDLE) IMPLANT
PROBE APC STR FIRE (PROBE) IMPLANT
PROBE INJECTION GOLD (MISCELLANEOUS)
PROBE INJECTION GOLD 7FR (MISCELLANEOUS) IMPLANT
SNARE SHORT THROW 13M SML OVAL (MISCELLANEOUS) IMPLANT
SYR 50ML LL SCALE MARK (SYRINGE) IMPLANT
TOWEL COTTON PACK 4EA (MISCELLANEOUS) ×4 IMPLANT
TUBING ENDO SMARTCAP PENTAX (MISCELLANEOUS) ×4 IMPLANT
TUBING IRRIGATION ENDOGATOR (MISCELLANEOUS) ×2 IMPLANT
WATER STERILE IRR 1000ML POUR (IV SOLUTION) IMPLANT

## 2023-04-21 NOTE — Progress Notes (Signed)
[facility] capsule endoscopy ordered by MD Bosie Clos.  Patient ingested capsule at 12:15 pm.  Per Given's capsule instructions, patient to remain NPO until 1415 at which time they may progress to clear liquid diet. At 1615 patient may have a small snack such as a half a sandwich or a bowl of soup. At 2018 patient may progress to previously ordered diet.  The capsule endoscopy study will conclude at 0015 10/22/2024at which time the recorder and leads or belt can be removed and placed in a patient belongings bag. Endoscopy staff will pick up the equipment in the AM.  Instructions provided to patient and inpatient RN. Patient and RN demonstrated understanding.[facility] capsule endoscopy ordered by MD Bosie Clos.

## 2023-04-21 NOTE — Plan of Care (Signed)

## 2023-04-21 NOTE — Anesthesia Preprocedure Evaluation (Signed)
Anesthesia Evaluation  Patient identified by MRN, date of birth, ID band Patient awake    Reviewed: Allergy & Precautions, NPO status , Patient's Chart, lab work & pertinent test results  History of Anesthesia Complications Negative for: history of anesthetic complications  Airway Mallampati: III  TM Distance: >3 FB Neck ROM: Full    Dental  (+) Dental Advisory Given   Pulmonary COPD, former smoker   breath sounds clear to auscultation       Cardiovascular hypertension, Pt. on medications and Pt. on home beta blockers (-) angina + DOE  (-) Past MI  Rhythm:Regular  1. Left ventricular ejection fraction, by estimation, is 60 to 65%. The  left ventricle has normal function. The left ventricle has no regional  wall motion abnormalities. Left ventricular diastolic parameters are  indeterminate.   2. Right ventricular systolic function is normal. The right ventricular  size is normal. There is moderately elevated pulmonary artery systolic  pressure.   3. Left atrial size was mildly dilated.   4. Right atrial size was mildly dilated.   5. The mitral valve is abnormal. Mild mitral valve regurgitation. No  evidence of mitral stenosis. Moderate mitral annular calcification.   6. The aortic valve is tricuspid. There is moderate calcification of the  aortic valve. Aortic valve regurgitation is mild. Mild to moderate aortic  valve sclerosis/calcification is present, without any evidence of aortic  stenosis.   7. The inferior vena cava is dilated in size with >50% respiratory  variability, suggesting right atrial pressure of 8 mmHg.     Neuro/Psych  Headaches    GI/Hepatic Neg liver ROS,GERD  Medicated,,? GI bleed    Endo/Other  negative endocrine ROS    Renal/GU negative Renal ROSLab Results      Component                Value               Date                      NA                       140                 04/21/2023                 K                        3.0 (L)             04/21/2023                CO2                      24                  04/21/2023                GLUCOSE                  120 (H)             04/21/2023                BUN                      13  04/21/2023                CREATININE               0.48                04/21/2023                CALCIUM                  7.9 (L)             04/21/2023                GFR                      123.64              11/18/2017                EGFR                     89                  04/03/2023                GFRNONAA                 >60                 04/21/2023                Musculoskeletal   Abdominal   Peds  Hematology  (+) Blood dyscrasia, anemia Lab Results      Component                Value               Date                      WBC                      6.0                 04/21/2023                HGB                      7.8 (L)             04/21/2023                HCT                      24.3 (L)            04/21/2023                MCV                      95.7                04/21/2023                PLT                      124 (L)             04/21/2023  Anesthesia Other Findings   Reproductive/Obstetrics                              Anesthesia Physical Anesthesia Plan  ASA: 3  Anesthesia Plan: MAC   Post-op Pain Management: Minimal or no pain anticipated   Induction: Intravenous  PONV Risk Score and Plan: 2 and Propofol infusion and Treatment may vary due to age or medical condition  Airway Management Planned: Simple Face Mask, Natural Airway and Nasal Cannula  Additional Equipment: None  Intra-op Plan:   Post-operative Plan:   Informed Consent: I have reviewed the patients History and Physical, chart, labs and discussed the procedure including the risks, benefits and alternatives for the proposed anesthesia with the patient or authorized  representative who has indicated his/her understanding and acceptance.   Patient has DNR.   Dental advisory given  Plan Discussed with: CRNA  Anesthesia Plan Comments:          Anesthesia Quick Evaluation

## 2023-04-21 NOTE — Transfer of Care (Signed)
Immediate Anesthesia Transfer of Care Note  Patient: [name]  Procedure(s) Performed: ESOPHAGOGASTRODUODENOSCOPY (EGD) WITH PROPOFOL GIVENS CAPSULE STUDY  Patient Location: PACU  Anesthesia Type:MAC  Level of Consciousness: drowsy and patient cooperative  Airway & Oxygen Therapy: Patient Spontanous Breathing and Patient connected to face mask oxygen  Post-op Assessment: Report given to RN and Post -op Vital signs reviewed and stable  Post vital signs: Reviewed and stable  Last Vitals:  Vitals Value Taken Time  BP    Temp    Pulse    Resp    SpO2      Last Pain:  Vitals:   04/21/23 1122  TempSrc: Temporal  PainSc: 0-No pain      Patients Stated Pain Goal: 0 (04/21/23 0900)  Complications: No notable events documented.

## 2023-04-21 NOTE — Op Note (Signed)
[facility] Patient Name: [name] Procedure Date: 04/21/2023 MRN: [id number] Attending MD: [name] , MD, [id number] Date of Birth: 07/07/1940 CSN: [id number] Age: 82 Admit Type: Inpatient Procedure:                Upper GI endoscopy Indications:              Melena Providers:                [name], MD, Stephens Shire RN, RN, Harrington Challenger, Technician Referring MD:             hospital team Medicines:                Propofol per Anesthesia, Monitored Anesthesia Care Complications:            No immediate complications. Estimated Blood Loss:     Estimated blood loss: none. Procedure:                Pre-Anesthesia Assessment:                           - Prior to the procedure, a History and Physical                            was performed, and patient medications and                            allergies were reviewed. The patient's tolerance of                            previous anesthesia was also reviewed. The risks                            and benefits of the procedure and the sedation                            options and risks were discussed with the patient.                            All questions were answered, and informed consent                            was obtained. Prior Anticoagulants: The patient has                            taken no anticoagulant or antiplatelet agents. ASA                            Grade Assessment: III - A patient with severe                            systemic disease. After reviewing the risks and  benefits, the patient was deemed in satisfactory                            condition to undergo the procedure.                           After obtaining informed consent, the endoscope was                            passed under direct vision. Throughout the                            procedure, the patient's blood pressure, pulse, and                             oxygen saturations were monitored continuously. The                            [id number]-[id number] ([id number]) Olympus endoscope was introduced                            through the mouth, and advanced to the second part                            of duodenum. The upper GI endoscopy was                            accomplished without difficulty. The patient                            tolerated the procedure well. Scope In: Scope Out: Findings:      The examined esophagus was normal.      The Z-line was regular and was found 40 cm from the incisors.      Segmental mild inflammation characterized by congestion (edema) and       erythema was found in the gastric antrum.      Multiple small sessile polyps with no bleeding and no stigmata of recent       bleeding were found in the gastric fundus and in the gastric body.      The examined duodenum was normal.      Endoscope then removed and capsule catheter inserted and capsule       attached in the usual fashion. Endoscope with capsule reinserted and       advanced to duodenal bulb where capsule was successfully deployed.       Endoscope removed and procedure was terminated. Impression:               - Normal esophagus.                           - Z-line regular, 40 cm from the incisors.                           - Acute gastritis.                           -  Multiple gastric polyps.                           - Normal examined duodenum.                           - No specimens collected. Moderate Sedation:      N/A - MAC procedure Recommendation:           - Advance diet as tolerated and clear liquid diet.                           - Observe patient's clinical course. Procedure Code(s):        --- Professional ---                           [phone], Esophagogastroduodenoscopy, flexible,                            transoral; diagnostic, including collection of                            specimen(s) by brushing or washing, when performed                             (separate procedure) Diagnosis Code(s):        --- Professional ---                           K92.1, Melena (includes Hematochezia)                           K29.00, Acute gastritis without bleeding                           K31.7, Polyp of stomach and duodenum CPT copyright 2022 American Medical Association. All rights reserved. The codes documented in this report are preliminary and upon coder review may  be revised to meet current compliance requirements. [name], MD 04/21/2023 12:35:29 PM This report has been signed electronically. Number of Addenda: 0

## 2023-04-21 NOTE — Progress Notes (Signed)
PROGRESS NOTE [name]  UEA:[id number] DOB: July 19, 1940 DOA: 04/19/2023 PCP: Octavia Heir, NP  Brief Narrative/Hospital Course: 82 y.o.f w/ anxiety, depression, chronic diastolic heart failure, osteoarthritis, glaucoma, hyperlipidemia, hypertension, pulmonary nodule, restless leg syndrome, osteoporosis, COPD, aortic arthrosclerosis, GERD, history of diverticular bleed with embolization, UGI bleed, chronic iron deficiency anemia due to chronic blood loss with multiple admissions due to GI bleed most recently seen from 03/27/2023 until 03/29/2023 with recent EGD in August 2024 that showed gastritis who presented to the ED from facility due to low hemoglobin level associated with several melena episodes.  In YN:WGNFAO Temp-98.1 F,HR-78,RR 16, BP 111/48 mmHg O2 sat 98% on RA.The patient received 500 mL normal saline bolus.Labs:WBC-7.9,HB-5.9 g/dL with an MCV of 130.8 fL and platelets 554.  PT 16.2 and INR 1.3.FOBT positive.CMP showed normal electrolytes after calcium correction, glucose 128, BUN 24 and creatinine 0.49 mg/deciliter.  AST 31 units/L.  Total protein was 5.8 and albumin 3.4 g/dL.  The rest of the LFTs were unremarkable. CTA GI bleed>> with no evidence of active GI bleed by CT. If further imaging evaluation required and nuclear medicine GI bleeding scan could be performed.  Cholelithiasis without cholecystitis.  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  Aortic atherosclerosis. 2u PRBC ordered and admitted     Subjective: Seen and examined, Overnight vitals/labs/events reviewed  She is hard of hearing, no new complaints.BP stable afebrile not hypoxic Labs showing hypokalemia, hemoglobin drifting down 7.8 g thrombocytopenia 124 improving N.p.o. for EGD today  Assessment and Plan: Principal Problem:   UGI bleed Active Problems:   Depression with anxiety   Hyperlipidemia   Glaucoma   COPD (chronic obstructive pulmonary disease) (HCC)   Iron  deficiency anemia   Hypertension   Pulmonary nodule 1 cm or greater in diameter   GERD (gastroesophageal reflux disease)   Class 1 obesity with body mass index (BMI) of 34.0 to 34.9 in adult   Thoracic aorta atherosclerosis (HCC)   Severe anemia  chronic anemia due to GI blood loss Melena  GERD History of upper GI bleed: Recent admission in September for GI bleeding had EGD in August showing gastritis.Appreciate GI input.  Plan is for EGD today if EGD negative then small bowel PillCam.  Received 2 units PRBC so far.  Continue PPI treatment H&H and transfuse if < 7 g hb Recent Labs    05/03/22 1033 05/03/22 1830 05/06/22 1247 05/07/22 0954 02/07/23 0000 03/27/23 2234 04/19/23 1144 04/19/23 1227 04/19/23 2033 04/20/23 0739 04/21/23 0241  HGB  --    < >  --    < > 11.4*   < > 5.9* 6.1* 6.7* 8.5* 7.8*  MCV  --    < >  --    < >  --    < > 103.8*  --   --  93.9 95.7  VITAMINB12 412  --  348  --   --   --   --   --   --   --   --   FOLATE 28.7  --  19.6  --   --   --   --   --   --   --   --   FERRITIN 34  --  45  --  44  --   --   --   --   --   --   TIBC 258  --  232*  --  326  --   --   --   --   --   --  IRON 143  --  87  --  70  --   --   --   --   --   --   RETICCTPCT 2.2  --  3.8*  --   --   --   --   --   --   --   --    < > = values in this interval not displayed.   Hypokalemia: Replacing IV  as npo Recent Labs  Lab 04/19/23 1144 04/19/23 1227 04/20/23 0739 04/21/23 0241  K 3.5 3.6 3.1* 3.0*   Thrombocytopenia: Likely from acute illness ??.  Trending up.  Monitor   Anxiety/depression: Mood is stable on sertraline  HLD Thoracic aortic atherosclerosis: Continue statin  HTN: Controlled, hold metoprolol due to #1.  COPD: Not in exacerbation, CONT Nebs pnr  Pulmonary nodule > 1 cm: Need follow-up with PCP/pulmonary as OP if she desires  Obesity with BMI of 34: Will benefit with weight loss  Resident of ALF  DVT prophylaxis: SCDs Start: 04/19/23  1443 Code Status:   Code Status: Do not attempt resuscitation (DNR) PRE-ARREST INTERVENTIONS DESIRED Family Communication: plan of care discussed with patient/no family at bedside. Patient status is: The patient because of severe anemia Level of care: Stepdown   Dispo: The patient is from: alf            Anticipated disposition: tbd Objective: Vitals last 24 hrs: Vitals:   04/21/23 0600 04/21/23 0816 04/21/23 0818 04/21/23 0900  BP: (!) 130/42   (!) 128/45  Pulse: 64   70  Resp: 20   20  Temp:  97.9 F (36.6 C)    TempSrc:  Oral    SpO2: 98%  98% 98%  Weight:      Height:       Weight change:   Physical Examination: General exam: alert awake, elderly hard of hearing obese.  HEENT:Oral mucosa moist, Ear/Nose WNL grossly Respiratory system: Bilaterally clear BS,no use of accessory muscle Cardiovascular system: S1 & S2 +, No JVD. Gastrointestinal system: Abdomen soft,NT,ND, BS+ Nervous System: Alert, awake, moving all extremities,and following commands. Extremities: LE edema neg,distal peripheral pulses palpable and warm.  Skin: No rashes,no icterus. MSK: Normal muscle bulk,tone, power   Medications reviewed:  Scheduled Meds:  Chlorhexidine Gluconate Cloth  6 each Topical Daily   dorzolamide-timolol  1 drop Both Eyes BID   latanoprost  1 drop Both Eyes QHS   Netarsudil Dimesylate  1 drop Both Eyes QHS   oxybutynin  5 mg Oral QHS   pantoprazole  40 mg Intravenous Q12H   sertraline  75 mg Oral Daily   simvastatin  20 mg Oral QPM   umeclidinium-vilanterol  1 puff Inhalation q morning   Continuous Infusions:  potassium chloride 10 mEq (04/21/23 1026)      Diet Order             Diet NPO time specified  Diet effective now                  Intake/Output Summary (Last 24 hours) at 04/21/2023 1103 Last data filed at 04/21/2023 1000 Gross per 24 hour  Intake 672.56 ml  Output 500 ml  Net 172.56 ml   Net IO Since Admission: 1,589.56 mL [04/21/23 1103]  Wt  Readings from Last 3 Encounters:  04/19/23 81.6 kg  04/16/23 82.3 kg  04/10/23 82.3 kg     Unresulted Labs (From admission, onward)     Start     Ordered  04/21/23 0500  Basic metabolic panel  Daily,   R     Question:  Specimen collection method  Answer:  Lab=Lab collect   04/20/23 0741   04/21/23 0500  CBC  Daily,   R     Question:  Specimen collection method  Answer:  Lab=Lab collect   04/20/23 0741          Data Reviewed: I have personally reviewed following labs and imaging studies CBC: Recent Labs  Lab 04/19/23 1144 04/19/23 1227 04/19/23 2033 04/20/23 0739 04/21/23 0241  WBC 7.9  --   --  9.2 6.0  NEUTROABS 5.3  --   --   --   --   HGB 5.9* 6.1* 6.7* 8.5* 7.8*  HCT 19.2* 18.0* 21.9* 26.0* 24.3*  MCV 103.8*  --   --  93.9 95.7  PLT 154  --   --  117* 124*   Basic Metabolic Panel: Recent Labs  Lab 04/19/23 1144 04/19/23 1227 04/20/23 0739 04/21/23 0241  NA 139 141 142 140  K 3.5 3.6 3.1* 3.0*  CL 106 104 113* 110  CO2 25  --  25 24  GLUCOSE 128* 124* 108* 120*  BUN 24* 22 24* 13  CREATININE 0.49 0.60 0.31* 0.48  CALCIUM 8.5*  --  7.6* 7.9*   GFR: Estimated Creatinine Clearance: 50.7 mL/min (by C-G formula based on SCr of 0.48 mg/dL). Liver Function Tests: Recent Labs  Lab 04/19/23 1144 04/20/23 0739  AST 16 16  ALT 9 9  ALKPHOS 31* 27*  BILITOT 0.6 1.1  PROT 5.8* 5.0*  ALBUMIN 3.4* 3.0*  No results for input(s): "LIPASE", "AMYLASE" in the last 168 hours. No results for input(s): "AMMONIA" in the last 168 hours. Coagulation Profile: Recent Labs  Lab 04/19/23 1144  INR 1.3*  No results for input(s): "PROCALCITON", "LATICACIDVEN" in the last 168 hours.  Recent Results (from the past 240 hour(s))  MRSA Next Gen by PCR, Nasal     Status: None   Collection Time: 04/19/23  4:50 PM   Specimen: Nasal Mucosa; Nasal Swab  Result Value Ref Range Status   MRSA by PCR Next Gen NOT DETECTED NOT DETECTED Final    Comment: (NOTE) The GeneXpert MRSA  Assay (FDA approved for NASAL specimens only), is one component of a comprehensive MRSA colonization surveillance program. It is not intended to diagnose MRSA infection nor to guide or monitor treatment for MRSA infections. Test performance is not FDA approved in patients less than 11 years old. Performed at [facility], [address] [place], [place] [address]     Antimicrobials: Anti-infectives (From admission, onward)    None      Culture/Microbiology    Component Value Date/Time   SDES BLOOD RIGHT ANTECUBITAL 09/06/2017 0950   SPECREQUEST  09/06/2017 0950    BOTTLES DRAWN AEROBIC AND ANAEROBIC Blood Culture adequate volume   CULT  09/06/2017 0950    NO GROWTH 5 DAYS Performed at [facility], [address], [place], [place] [address]    REPTSTATUS 09/11/2017 FINAL 09/06/2017 0950  Radiology Studies: CT ANGIO GI BLEED  Result Date: 04/19/2023 CLINICAL DATA:  GI bleed.  Anemia EXAM: CTA ABDOMEN AND PELVIS WITHOUT AND WITH CONTRAST TECHNIQUE: Multidetector CT imaging of the abdomen and pelvis was performed using the standard protocol during bolus administration of intravenous contrast. Multiplanar reconstructed images and MIPs were obtained and reviewed to evaluate the vascular anatomy. RADIATION DOSE REDUCTION: This exam was performed according to  the departmental dose-optimization program which includes automated exposure control, adjustment of the mA and/or kV according to patient size and/or use of iterative reconstruction technique. CONTRAST:  OMNIPAQUE IOHEXOL 350 MG/ML SOLN COMPARISON:  05/03/2022 FINDINGS: VASCULAR Aorta: Normal caliber aorta without aneurysm, dissection, vasculitis or significant stenosis. Moderate atherosclerosis. Celiac: Patent without evidence of aneurysm, dissection, vasculitis or significant stenosis. SMA: Patent without evidence of aneurysm, dissection, vasculitis or significant stenosis. Renals: Atherosclerotic plaque  at the ostia of the bilateral renal arteries demonstrates right greater than left proximal stenosis, similar to prior. No aneurysm or evidence of fibromuscular dysplasia. IMA: Patent. Inflow: Patent without evidence of aneurysm, dissection, vasculitis or significant stenosis. Proximal Outflow: Bilateral common femoral and visualized portions of the superficial and profunda femoral arteries are patent without evidence of aneurysm, dissection, vasculitis or significant stenosis. Veins: Major venous structures of the abdomen are patent. Review of the MIP images confirms the above findings. NON-VASCULAR Lower chest: No acute abnormality. Hepatobiliary: No focal liver abnormality. Small amount of layering sludge or small stones within the gallbladder. No gallbladder wall thickening or pericholecystic inflammatory changes by CT. No biliary dilatation. Pancreas: Unremarkable. No pancreatic ductal dilatation or surrounding inflammatory changes. Spleen: Normal in size without focal abnormality. Adrenals/Urinary Tract: Unremarkable adrenal glands. Unchanged left caliceal diverticulum. Kidneys enhance symmetrically. No stone or hydronephrosis. Urinary bladder within normal limits. Stomach/Bowel: Stomach within normal limits. No abnormally dilated loops of bowel. Normal appendix in the right lower quadrant. Severe colonic diverticulosis. No focal bowel wall thickening or inflammatory changes. No contrast accumulation within the bowel lumen is identified on multi phasic imaging to suggest active GI bleed by CT. Lymphatic: No abdominopelvic lymphadenopathy. Reproductive: Uterus and bilateral adnexa are unremarkable. Other: No free air or free fluid. 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 No acute bony abnormalities. IMPRESSION: 1. No evidence of active GI bleed by CT. If further imaging evaluation is clinically warranted, a nuclear medicine GI bleeding scan could be  performed. 2. Severe colonic diverticulosis without evidence of acute diverticulitis. 3. Cholelithiasis without evidence of acute cholecystitis. 4. 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 5. Aortic atherosclerosis (ICD10-I70.0). Electronically Signed   By: Duanne Guess D.O.   On: 04/19/2023 15:46    LOS: 2 days  Lanae Boast, MD [facility]  04/21/2023, 11:03 AM

## 2023-04-21 NOTE — TOC Initial Note (Signed)
Transition of Care [facility]) - Initial/Assessment Note    Patient Details  Name: [name] MRN: [id number] Date of Birth: August 05, 1940  Transition of Care [facility]) CM/SW Contact:    Otelia Santee, LCSW Phone Number: 04/21/2023, 2:10 PM  Clinical Narrative:                 Pt from [facility] where she is a resident in the ALF. Pt has a RW and uses 2L of O2 at night. Pt receives in house PT/OT through [facility]. Pt will require PTAR for transportation home at discharge.  TOC will continue to follow.   Expected Discharge Plan: Long Term Nursing Home Barriers to Discharge: Continued Medical Work up   Patient Goals and CMS Choice Patient states their goals for this hospitalization and ongoing recovery are:: To return to [facility] CMS Medicare.gov Compare Post Acute Care list provided to::  (NA) Choice offered to / list presented to :  (NA)      Expected Discharge Plan and Services In-house Referral: NA Discharge Planning Services: NA Post Acute Care Choice: Resumption of Svcs/PTA Provider Living arrangements for the past 2 months: Assisted Living Facility                 DME Arranged: N/A DME Agency: NA                  Prior Living Arrangements/Services Living arrangements for the past 2 months: Assisted Living Facility Lives with:: Facility Resident Patient language and need for interpreter reviewed:: Yes Do you feel safe going back to the place where you live?: Yes      Need for Family Participation in Patient Care: Yes (Comment) Care giver support system in place?: Yes (comment) Current home services: DME (RW, 2L O2 at night) Criminal Activity/Legal Involvement Pertinent to Current Situation/Hospitalization: No - Comment as needed  Activities of Daily Living      Permission Sought/Granted Permission sought to share information with : Facility Industrial/product designer granted to share information with : Yes, Verbal Permission Granted               Emotional Assessment       Orientation: : Oriented to Self, Oriented to Place, Oriented to  Time, Oriented to Situation Alcohol / Substance Use: Not Applicable Psych Involvement: No (comment)  Admission diagnosis:  UGI bleed [K92.2] Gastrointestinal hemorrhage, unspecified gastrointestinal hemorrhage type [K92.2] Patient Active Problem List   Diagnosis Date Noted   UGI bleed 04/19/2023   Recurrent depression (HCC) 04/13/2023   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   History of GI diverticular bleed 03/28/2023   GAD (generalized anxiety disorder) 03/28/2023   Diastolic heart failure (HCC) 03/19/2023   Class 1 obesity with body mass index (BMI) of 34.0 to 34.9 in adult 03/19/2023   Thoracic aorta atherosclerosis (HCC) 03/19/2023   Melena 01/05/2023   Hypotension 05/02/2022   UTI (urinary tract infection) 02/05/2022   Restless leg syndrome 02/05/2022   Senile osteoporosis 02/05/2022   Hypertension 02/05/2022   Pulmonary nodule 1 cm or greater in diameter 02/05/2022   GERD (gastroesophageal reflux disease) 02/05/2022   Upper GI bleed 01/07/2022   Iron deficiency anemia 05/02/2021   Chronic anemia    DOE (dyspnea on exertion) 11/18/2017   COPD (chronic obstructive pulmonary disease) (HCC) 11/18/2017   Diverticulosis of intestine with bleeding 09/05/2017   Acute respiratory failure with hypoxia (HCC) 08/07/2017   Depression with anxiety 08/07/2017   Hyperlipidemia 08/07/2017  Macular degeneration 08/07/2017   Glaucoma 08/07/2017   Weakness 08/07/2017   Dehydration 08/07/2017   Lower GI bleeding 10/14/2016   Diverticulosis of colon 10/14/2016   Anxiety 10/14/2016   Lower GI bleed 10/14/2016   GI bleed    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 [place] [address] Phone: [phone] Fax: [phone]     Social Determinants of Health (SDOH) Social History: SDOH Screenings   Food Insecurity: No Food Insecurity (04/04/2023)  Housing: Low Risk  (04/04/2023)  Transportation Needs: No Transportation Needs (04/04/2023)  Utilities: Not At Risk (04/04/2023)  Alcohol Screen: Low Risk  (04/04/2023)  Depression (PHQ2-9): Low Risk  (04/04/2023)  Financial Resource Strain: Low Risk  (04/04/2023)  Physical Activity: Insufficiently Active (04/04/2023)  Social Connections: Moderately Isolated (04/04/2023)  Stress: No Stress Concern Present (04/04/2023)  Tobacco Use: Medium Risk (04/21/2023)  Health Literacy: Adequate Health Literacy (04/04/2023)   SDOH Interventions:     Readmission Risk Interventions    04/21/2023    2:05 PM 03/29/2023    9:45 AM 01/07/2023    1:24 PM  Readmission Risk Prevention Plan  Transportation Screening Complete Complete Complete  PCP or Specialist Appt within 5-7 Days  Complete Complete  PCP or Specialist Appt within 3-5 Days Complete    Home Care Screening  Complete Complete  Medication Review (RN CM)  Complete Complete  HRI or Home Care Consult Complete    Social Work Consult for Recovery Care Planning/Counseling Complete    Palliative Care Screening Not Applicable    Medication Review Oceanographer) Complete

## 2023-04-21 NOTE — Anesthesia Procedure Notes (Signed)
Procedure Name: MAC Date/Time: 04/21/2023 12:14 PM  Performed by: Adria Dill, CRNAPre-anesthesia Checklist: Patient identified, Emergency Drugs available, Suction available and Patient being monitored Patient Re-evaluated:Patient Re-evaluated prior to induction Oxygen Delivery Method: Nasal cannula Preoxygenation: Pre-oxygenation with 100% oxygen Induction Type: IV induction Airway Equipment and Method: Bite block Placement Confirmation: positive ETCO2 and breath sounds checked- equal and bilateral Dental Injury: Teeth and Oropharynx as per pre-operative assessment

## 2023-04-21 NOTE — Interval H&P Note (Signed)
History and Physical Interval Note:  04/21/2023 11:54 AM  [name]  has presented today for surgery, with the diagnosis of melena, anemia.  The various methods of treatment have been discussed with the patient and family. After consideration of risks, benefits and other options for treatment, the patient has consented to  Procedure(s): ESOPHAGOGASTRODUODENOSCOPY (EGD) WITH PROPOFOL (N/A) GIVENS CAPSULE STUDY (N/A) as a surgical intervention.  The patient's history has been reviewed, patient examined, no change in status, stable for surgery.  I have reviewed the patient's chart and labs.  Questions were answered to the patient's satisfaction.     [name]

## 2023-04-22 DIAGNOSIS — K922 Gastrointestinal hemorrhage, unspecified: Secondary | ICD-10-CM | POA: Diagnosis not present

## 2023-04-22 LAB — CBC
HCT: 26.1 % — ABNORMAL LOW (ref 36.0–46.0)
Hemoglobin: 8.1 g/dL — ABNORMAL LOW (ref 12.0–15.0)
MCH: 30.7 pg (ref 26.0–34.0)
MCHC: 31 g/dL (ref 30.0–36.0)
MCV: 98.9 fL (ref 80.0–100.0)
Platelets: 134 10*3/uL — ABNORMAL LOW (ref 150–400)
RBC: 2.64 MIL/uL — ABNORMAL LOW (ref 3.87–5.11)
RDW: 18.2 % — ABNORMAL HIGH (ref 11.5–15.5)
WBC: 6.9 10*3/uL (ref 4.0–10.5)
nRBC: 0.3 % — ABNORMAL HIGH (ref 0.0–0.2)

## 2023-04-22 LAB — BASIC METABOLIC PANEL
Anion gap: 6 (ref 5–15)
BUN: 6 mg/dL — ABNORMAL LOW (ref 8–23)
CO2: 24 mmol/L (ref 22–32)
Calcium: 7.9 mg/dL — ABNORMAL LOW (ref 8.9–10.3)
Chloride: 110 mmol/L (ref 98–111)
Creatinine, Ser: 0.43 mg/dL — ABNORMAL LOW (ref 0.44–1.00)
GFR, Estimated: >60 mL/min (ref >60)
Glucose, Bld: 118 mg/dL — ABNORMAL HIGH (ref 70–99)
Potassium: 3.6 mmol/L (ref 3.5–5.1)
Sodium: 140 mmol/L (ref 135–145)

## 2023-04-22 MED ORDER — PEG 3350-KCL-NA BICARB-NACL 420 G PO SOLR
4000.0000 mL | Freq: Once | ORAL | Status: AC
  Administered 2023-04-23: 4000 mL via ORAL
  Filled 2023-04-22: qty 4000

## 2023-04-22 MED ORDER — SODIUM CHLORIDE 0.9 % IV SOLN: INTRAVENOUS | Status: AC

## 2023-04-22 MED ORDER — INFLUENZA VAC A&B SURF ANT ADJ 0.5 ML IM SUSY
0.5000 mL | PREFILLED_SYRINGE | INTRAMUSCULAR | Status: AC
  Administered 2023-04-27: 0.5 mL via INTRAMUSCULAR
  Filled 2023-04-22 (×2): qty 0.5

## 2023-04-22 MED ORDER — GUAIFENESIN 100 MG/5ML PO LIQD: 10.0000 mL | ORAL | Status: DC | PRN

## 2023-04-22 MED ORDER — POLYETHYLENE GLYCOL 3350 17 GM/SCOOP PO POWD
1.0000 | Freq: Once | ORAL | Status: AC
Start: 2023-04-22 — End: 2023-04-22
  Administered 2023-04-22: 255 g via ORAL
  Filled 2023-04-22: qty 255

## 2023-04-22 NOTE — Evaluation (Signed)
Occupational Therapy Evaluation Patient Details Name: [name] MRN: [id number] DOB: Jun 10, 1941 Today's Date: 04/22/2023   History of Present Illness Patient is a 82 year old female who presented to the hospital with low hgb. Patient was admitted with UGI bleed.  [name], depression, chronic diastolic heart failure, osteoarthritis, glaucoma, hyperlipidemia, hypertension, pulmonary nodule, restless leg syndrome, osteoporosis, COPD, aortic arthrosclerosis, GERD, history of diverticular bleed with embolization, UGI bleed, chronic iron deficiency anemia   Clinical Impression   Patient is a 82 year old female who was admitted for above. Patient was living at ALF with some support for LB dressing tasks but otherwise independent at RW level per patient report. Patient was min A +2 for transfer to [facility] with TD for hygiene with patient unable to maintain balance without BUE support. Patient was noted to have decreased functional activity tolerance, decreased endurance, decreased standing balance, decreased safety awareness, and decreased knowledge of AD/AE impacting participation in ADLs. Patient will benefit from continued inpatient follow up therapy, <3 hours/day        If plan is discharge home, recommend the following: A lot of help with bathing/dressing/bathroom;Assistance with cooking/housework;Direct supervision/assist for medications management;Assist for transportation;Help with stairs or ramp for entrance;Direct supervision/assist for financial management;A lot of help with walking and/or transfers    Functional Status Assessment  Patient has had a recent decline in their functional status and demonstrates the ability to make significant improvements in function in a reasonable and predictable amount of time.  Equipment Recommendations  None recommended by OT       Precautions / Restrictions Precautions Precautions: Fall Precaution Comments: incontinent Restrictions Weight Bearing  Restrictions: No      Mobility Bed Mobility Overal bed mobility: Needs Assistance Bed Mobility: Supine to Sit     Supine to sit: Min assist     General bed mobility comments: light HHA to pull  to sitting upright and scoot to bed edge          Balance Overall balance assessment: Needs assistance Sitting-balance support: Bilateral upper extremity supported, Feet supported Sitting balance-Leahy Scale: Fair     Standing balance support: No upper extremity supported, Bilateral upper extremity supported, During functional activity Standing balance-Leahy Scale: Poor Standing balance comment: reliant on  UE suport         ADL either performed or assessed with clinical judgement   ADL Overall ADL's : Needs assistance/impaired Eating/Feeding: Modified independent;Sitting   Grooming: Supervision/safety;Sitting Grooming Details (indicate cue type and reason): needed cues to find things on tray Upper Body Bathing: Minimal assistance;Sitting   Lower Body Bathing: Maximal assistance;Sitting/lateral leans   Upper Body Dressing : Minimal assistance;Sitting   Lower Body Dressing: Maximal assistance;Sitting/lateral leans   Toilet Transfer: Minimal assistance;+2 for safety/equipment;BSC/3in1;Stand-pivot;Rolling walker (2 wheels) Toilet Transfer Details (indicate cue type and reason): then to reclienr in room with increased time. patient walking out of gripper socks with movement.   Toileting - Clothing Manipulation Details (indicate cue type and reason): patient first asking for bed pan with education provided on importance of getting up and moving especially if she planned to get back to ALF at [facility]. patient was agreeable with increased encouragement. patient did report having chronic incontinence episodes at home with wearing adult absorbent undergarments at baseline.             Vision Patient Visual Report: No change from baseline Additional Comments: patient reported  needing high contrast to see things in room. needed cues for finding things on tray and  was not able to use touch to ensure the right thing was picked up            Pertinent Vitals/Pain Pain Assessment Pain Assessment: No/denies pain     Extremity/Trunk Assessment Upper Extremity Assessment Upper Extremity Assessment: Generalized weakness (RUE reporting having "rotator cuff issues" WFL ROM)   Lower Extremity Assessment Lower Extremity Assessment: Defer to PT evaluation   Cervical / Trunk Assessment Cervical / Trunk Assessment: Kyphotic   Communication Communication Communication: Hearing impairment Cueing Techniques: Gestural cues;Verbal cues;Tactile cues   Cognition Arousal: Alert Behavior During Therapy: WFL for tasks assessed/performed, Anxious Overall Cognitive Status: No family/caregiver present to determine baseline cognitive functioning         General Comments: HOH, patient likes to self direct session. patient needed cues to stay on topic.                Home Living Family/patient expects to be discharged to:: Assisted living           Home Equipment: Rolling Walker (2 wheels)          Prior Functioning/Environment Prior Level of Function : Needs assist       Physical Assist : Mobility (physical);ADLs (physical)   ADLs (physical): Dressing;Bathing Mobility Comments: pt. reports independent with RW, toileting, ADLs Comments: has assist with stockings and shoes PRN        OT Problem List: Decreased activity tolerance;Impaired balance (sitting and/or standing);Decreased coordination;Decreased safety awareness;Decreased knowledge of precautions;Cardiopulmonary status limiting activity      OT Treatment/Interventions: Self-care/ADL training;Therapeutic exercise;DME and/or AE instruction;Therapeutic activities;Patient/family education;Balance training    OT Goals(Current goals can be found in the care plan section) Acute Rehab OT Goals Patient  Stated Goal: to go back home OT Goal Formulation: With patient Time For Goal Achievement: 05/06/23 Potential to Achieve Goals: Fair  OT Frequency: Min 1X/week    Co-evaluation PT/OT/SLP Co-Evaluation/Treatment: Yes Reason for Co-Treatment: To address functional/ADL transfers PT goals addressed during session: Mobility/safety with mobility OT goals addressed during session: ADL's and self-care      AM-PAC OT "6 Clicks" Daily Activity     Outcome Measure Help from another person eating meals?: A Little Help from another person taking care of personal grooming?: A Little Help from another person toileting, which includes using toliet, bedpan, or urinal?: A Lot Help from another person bathing (including washing, rinsing, drying)?: A Lot Help from another person to put on and taking off regular upper body clothing?: A Little Help from another person to put on and taking off regular lower body clothing?: A Lot 6 Click Score: 15   End of Session Equipment Utilized During Treatment: Gait belt;Rolling walker (2 wheels) Nurse Communication: Mobility status  Activity Tolerance: Patient tolerated treatment well Patient left: in chair;with call bell/phone within reach;with chair alarm set  OT Visit Diagnosis: Unsteadiness on feet (R26.81);Other abnormalities of gait and mobility (R26.89);Muscle weakness (generalized) (M62.81)                Time: 1610-9604 OT Time Calculation (min): 23 min Charges:  OT General Charges $OT Visit: 1 Visit OT Evaluation $OT Eval Low Complexity: 1 Low  Armando Lauman OTR/L, MS Acute Rehabilitation Department Office# [phone]   Selinda Flavin 04/22/2023, 12:49 PM

## 2023-04-22 NOTE — Progress Notes (Deleted)
   04/22/23 1501  Spiritual Encounters  Type of Visit Initial  Care provided to: Family  Referral source Nurse (RN/NT/LPN)  Reason for visit Advance directives  OnCall Visit No  Advance Directives (For Healthcare)  Does Patient Have a Medical Advance Directive? Yes   Patient was "having a moment." Family accepted the AD paperwork. However, I did not have the opportunity to provide AD education.   Arlyce Dice, Chaplain Resident

## 2023-04-22 NOTE — Progress Notes (Signed)
[facility] Gastroenterology Progress Note  [name] UJWJXB 82 y.o. Jun 24, 1941   Subjective: Denies abdominal pain. Denies rectal bleeding. Husband in room.  Objective: Vital signs: Vitals:   04/22/23 1000 04/22/23 1200  BP: (!) 129/47   Pulse: 87   Resp: (!) 22   Temp:  97.6 F (36.4 C)  SpO2: 96%     Physical Exam: Gen: lethargic, elderly, chronically ill-appearing, no acute distress  HEENT: anicteric sclera CV: RRR Chest: CTA B Abd: epigastric tenderness with guarding, soft, nondistended Ext: no edema  Lab Results: Recent Labs    04/21/23 0241 04/22/23 0331  NA 140 140  K 3.0* 3.6  CL 110 110  CO2 24 24  GLUCOSE 120* 118*  BUN 13 6*  CREATININE 0.48 0.43*  CALCIUM 7.9* 7.9*   Recent Labs    04/20/23 0739  AST 16  ALT 9  ALKPHOS 27*  BILITOT 1.1  PROT 5.0*  ALBUMIN 3.0*   Recent Labs    04/21/23 0241 04/21/23 1727 04/22/23 0331  WBC 6.0  --  6.9  HGB 7.8* 8.6* 8.1*  HCT 24.3* 27.7* 26.1*  MCV 95.7  --  98.9  PLT 124*  --  134*      Assessment/Plan: Obscure overt bleeding - capsule study shows focal area of red blood in proximal colon. Small bowel normal on capsule endoscopy. Needs a repeat colonoscopy with 2 day prep since colonoscopy last November was poor prep. Miralax prep today and NuLytely tomorrow. Colonoscopy Friday 04/25/23. Clear liquid diet. Supportive care.   [name] 04/22/2023, 3:42 PM  Questions please call [phone] ID: [name], female   DOB: June 19, 1941, 82 y.o.   MRN: [id number]

## 2023-04-22 NOTE — Plan of Care (Signed)

## 2023-04-22 NOTE — Progress Notes (Signed)
PROGRESS NOTE [name]  QIO:[id number] DOB: [birth year] DOA: 04/19/2023 PCP: Octavia Heir, NP  Brief Narrative/Hospital Course: 82 y.o.f w/ anxiety,depression, chronic diastolic heart failure, osteoarthritis, glaucoma, hyperlipidemia, hypertension, pulmonary nodule, restless leg syndrome, osteoporosis, COPD, aortic arthrosclerosis, GERD, history of diverticular bleed with embolization, UGI bleed, chronic iron deficiency anemia due to chronic blood loss with multiple admissions due to GI bleed most recently seen from 03/27/2023 until 03/29/2023 with recent EGD in August 2024 that showed gastritis who presented to the ED from facility due to low hemoglobin level associated with several melena episodes.  In LK:GMWNUU Temp-98.1 F,HR-78,RR 16, BP 111/48 mmHg O2 sat 98% on RA.The patient received 500 mL normal saline bolus.Labs:WBC-7.9,HB-5.9 g/dL with an MCV of 725.3 fL and platelets 554.  PT 16.2 and INR 1.3.FOBT positive.CMP showed normal electrolytes after calcium correction, glucose 128, BUN 24 and creatinine 0.49 mg/deciliter.  AST 31 units/L.  Total protein was 5.8 and albumin 3.4 g/dL.The rest of the LFTs were unremarkable. CTA GI bleed>no evidence of active GI bleed by CT,Cholelithiasis without cholecystitis.  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  Aortic atherosclerosis. 2u PRBC ordered and admitted. Seen by GI. EGD done 10/21> Acute gastritis,multiple gastric polyps.Normal examined duodenum.  CamPill placed and monitored in SDU and completed 10/22 Her hemoglobin has been stable above 8.  Subjective: Patient seen and examined this morning She is alert awake resting comfortably no complaints Hb stable. Last BM 10/20.  Completed capsule endoscopy this morning  Assessment and Plan: Principal Problem:   UGI bleed Active Problems:   Depression with anxiety   Hyperlipidemia   Glaucoma   COPD (chronic obstructive pulmonary disease) (HCC)   Iron  deficiency anemia   Hypertension   Pulmonary nodule 1 cm or greater in diameter   GERD (gastroesophageal reflux disease)   Class 1 obesity with body mass index (BMI) of 34.0 to 34.9 in adult   Thoracic aorta atherosclerosis (HCC)   Severe anemia  chronic anemia due to GI blood loss Melena  GERD History of upper GI bleed: Recent admission in September for GI bleeding had EGD in August showing gastritis.S/p 2 units PRBC so far. Appreciate GI input.  EGD done 10/21> Acute gastritis,multiple gastric polyps.Normal examined duodenum. CamPill placed and monitored in SDU and completed 10/22 reprot pending.Hb stable. Continue PPI treatment H&H and transfuse if < 7 g hb Recent Labs    05/03/22 1033 05/03/22 1830 05/06/22 1247 05/07/22 0954 02/07/23 0000 03/27/23 2234 04/19/23 2033 04/20/23 0739 04/21/23 0241 04/21/23 1727 04/22/23 0331  HGB  --    < >  --    < > 11.4*   < > 6.7* 8.5* 7.8* 8.6* 8.1*  MCV  --    < >  --    < >  --    < >  --  93.9 95.7  --  98.9  VITAMINB12 412  --  348  --   --   --   --   --   --   --   --   FOLATE 28.7  --  19.6  --   --   --   --   --   --   --   --   FERRITIN 34  --  45  --  44  --   --   --   --   --   --   TIBC 258  --  232*  --  326  --   --   --   --   --   --  IRON 143  --  87  --  70  --   --   --   --   --   --   RETICCTPCT 2.2  --  3.8*  --   --   --   --   --   --   --   --    < > = values in this interval not displayed.   Hypokalemia: Resolved Recent Labs  Lab 04/19/23 1144 04/19/23 1227 04/20/23 0739 04/21/23 0241 04/22/23 0331  K 3.5 3.6 3.1* 3.0* 3.6   Thrombocytopenia: Likely from acute illness ??Trending up. Monitor   Anxiety/depression: Mood is stable on sertraline  HLD Thoracic aortic atherosclerosis: Continue statin home meds.  HTN: Controlled, hold metoprolol due to #1.  COPD: Not in exacerbation, CONT Nebs pnr  Pulmonary nodule > 1 cm: Need follow-up with PCP/pulmonary as OP if she desires  Obesity with  BMI of 34: Will benefit with weight loss  Resident of ALF PT OT evaluation requested for disposition.  DVT prophylaxis: SCDs Start: 04/19/23 1443 Code Status:   Code Status: Do not attempt resuscitation (DNR) PRE-ARREST INTERVENTIONS DESIRED Family Communication: plan of care discussed with patient/no family at bedside. I had updated patient's son Dr.Mike Umscheid yesterday.  Patient status is: The patient because of severe anemia Level of care: Stepdown  Dispo: The patient is from: alf            Anticipated disposition: tbd after GI clearance Objective: Vitals last 24 hrs: Vitals:   04/22/23 0600 04/22/23 0658 04/22/23 0800 04/22/23 0813  BP: (!) 104/32  (!) 119/41   Pulse: 72  73 75  Resp: 16  20 20   Temp:  97.7 F (36.5 C)    TempSrc:  Oral    SpO2: 96%  97% 96%  Weight:      Height:       Weight change:   Physical Examination: General exam: alert awake, hard of hearing HEENT:Oral mucosa moist, Ear/Nose WNL grossly Respiratory system: Bilaterally clear BS,no use of accessory muscle Cardiovascular system: S1 & S2 +, No JVD. Gastrointestinal system: Abdomen soft,NT,ND, BS+ Nervous System: Alert, awake, moving all extremities,and following commands. Extremities: LE edema neg,distal peripheral pulses palpable and warm.  Skin: No rashes,no icterus. MSK: Normal muscle bulk,tone, power r   Medications reviewed:  Scheduled Meds:  Chlorhexidine Gluconate Cloth  6 each Topical Daily   dorzolamide-timolol  1 drop Both Eyes BID   latanoprost  1 drop Both Eyes QHS   melatonin  5 mg Oral QHS   Netarsudil Dimesylate  1 drop Both Eyes QHS   oxybutynin  5 mg Oral QHS   pantoprazole  40 mg Intravenous Q12H   sertraline  75 mg Oral Daily   simvastatin  20 mg Oral QPM   umeclidinium-vilanterol  1 puff Inhalation q morning   Continuous Infusions:      Diet Order             Diet clear liquid Fluid consistency: Thin  Diet effective now                  Intake/Output  Summary (Last 24 hours) at 04/22/2023 1015 Last data filed at 04/21/2023 2300 Gross per 24 hour  Intake 931.48 ml  Output 1700 ml  Net -768.52 ml   Net IO Since Admission: 821.04 mL [04/22/23 1015]  Wt Readings from Last 3 Encounters:  04/19/23 81.6 kg  04/16/23 82.3 kg  04/10/23 82.3 kg  Unresulted Labs (From admission, onward)     Start     Ordered   04/21/23 0500  Basic metabolic panel  Daily,   R     Question:  Specimen collection method  Answer:  Lab=Lab collect   04/20/23 0741   04/21/23 0500  CBC  Daily,   R     Question:  Specimen collection method  Answer:  Lab=Lab collect   04/20/23 0741          Data Reviewed: I have personally reviewed following labs and imaging studies CBC: Recent Labs  Lab 04/19/23 1144 04/19/23 1227 04/19/23 2033 04/20/23 0739 04/21/23 0241 04/21/23 1727 04/22/23 0331  WBC 7.9  --   --  9.2 6.0  --  6.9  NEUTROABS 5.3  --   --   --   --   --   --   HGB 5.9*   < > 6.7* 8.5* 7.8* 8.6* 8.1*  HCT 19.2*   < > 21.9* 26.0* 24.3* 27.7* 26.1*  MCV 103.8*  --   --  93.9 95.7  --  98.9  PLT 154  --   --  117* 124*  --  134*   < > = values in this interval not displayed.   Basic Metabolic Panel: Recent Labs  Lab 04/19/23 1144 04/19/23 1227 04/20/23 0739 04/21/23 0241 04/22/23 0331  NA 139 141 142 140 140  K 3.5 3.6 3.1* 3.0* 3.6  CL 106 104 113* 110 110  CO2 25  --  25 24 24   GLUCOSE 128* 124* 108* 120* 118*  BUN 24* 22 24* 13 6*  CREATININE 0.49 0.60 0.31* 0.48 0.43*  CALCIUM 8.5*  --  7.6* 7.9* 7.9*   GFR: Estimated Creatinine Clearance: 50.7 mL/min (A) (by C-G formula based on SCr of 0.43 mg/dL (L)). Liver Function Tests: Recent Labs  Lab 04/19/23 1144 04/20/23 0739  AST 16 16  ALT 9 9  ALKPHOS 31* 27*  BILITOT 0.6 1.1  PROT 5.8* 5.0*  ALBUMIN 3.4* 3.0*  No results for input(s): "LIPASE", "AMYLASE" in the last 168 hours. No results for input(s): "AMMONIA" in the last 168 hours. Coagulation Profile: Recent Labs   Lab 04/19/23 1144  INR 1.3*  No results for input(s): "PROCALCITON", "LATICACIDVEN" in the last 168 hours.  Recent Results (from the past 240 hour(s))  MRSA Next Gen by PCR, Nasal     Status: None   Collection Time: 04/19/23  4:50 PM   Specimen: Nasal Mucosa; Nasal Swab  Result Value Ref Range Status   MRSA by PCR Next Gen NOT DETECTED NOT DETECTED Final    Comment: (NOTE) The GeneXpert MRSA Assay (FDA approved for NASAL specimens only), is one component of a comprehensive MRSA colonization surveillance program. It is not intended to diagnose MRSA infection nor to guide or monitor treatment for MRSA infections. Test performance is not FDA approved in patients less than 95 years old. Performed at [facility], [address], [place], [place] [address]     Antimicrobials: Anti-infectives (From admission, onward)    None      Culture/Microbiology    Component Value Date/Time   SDES BLOOD RIGHT ANTECUBITAL 09/06/2017 0950   SPECREQUEST  09/06/2017 0950    BOTTLES DRAWN AEROBIC AND ANAEROBIC Blood Culture adequate volume   CULT  09/06/2017 0950    NO GROWTH 5 DAYS Performed at [facility], [address] [place], [place] [address]    REPTSTATUS 09/11/2017 FINAL 09/06/2017  2956  Radiology Studies: No results found.  LOS: 3 days  Lanae Boast, MD [facility]  04/22/2023, 10:15 AM

## 2023-04-22 NOTE — Progress Notes (Signed)
   04/22/23 1501  Spiritual Encounters  Type of Visit Initial  Care provided to: Patient  Conversation partners present during encounter Nurse  Referral source Nurse (RN/NT/LPN)  Reason for visit Routine spiritual support  OnCall Visit No  Spiritual Framework  Presenting Themes Values and beliefs;Significant life change  Values/beliefs belief in the [place] God and prayer.  Patient Stress Factors Health changes  Family Stress Factors Major life changes  Interventions  Spiritual Care Interventions Made Established relationship of care and support;Compassionate presence;Reflective listening;Normalization of emotions;Explored values/beliefs/practices/strengths;Meaning making;Prayer;Encouragement  Intervention Outcomes  Outcomes Connection to spiritual care;Awareness of support;Awareness around self/spiritual resourses;Reduced anxiety;Reduced isolation  Spiritual Care Plan  Spiritual Care Issues Still Outstanding No further spiritual care needs at this time (see row info)  Advance Directives (For Healthcare)  Does Patient Have a Medical Advance Directive? Yes   Patient believes she is being punished. She has lost some sentimental items and is unsure of her diagnosis and prognosis. Patient believes she has been a good person. Patient is concerned about being able to vote in the 2024 presidential election. Chaplain provided spiritual and emotional support. Chaplain listened to her concerns and validated her feelings. Chaplain prayed with patient per her request.   Arlyce Dice, Chaplain Resident   Amn

## 2023-04-22 NOTE — Evaluation (Signed)
Physical Therapy Evaluation Patient Details Name: [name] MRN: [id number] DOB: [birth year] Today's Date: 04/22/2023  History of Present Illness  82 y.o.f w/ anxiety, depression, chronic diastolic heart failure, osteoarthritis, glaucoma, hyperlipidemia, hypertension, pulmonary nodule, restless leg syndrome, osteoporosis, COPD, aortic arthrosclerosis, GERD, history of diverticular bleed with embolization, UGI bleed, chronic iron deficiency anemia due to chronic blood loss with multiple admissions due to GI bleed most recently seen from 03/27/2023 until 03/29/2023 with recent EGD in August 2024 that showed gastritis who presented to the ED 04/19/23  from facility due to low hemoglobin level associated with several melena episodes.  Clinical Impression  Pt admitted with above diagnosis.  Pt currently with functional limitations due to the deficits listed below (see PT Problem List). Pt will benefit from acute skilled PT to increase their independence and safety with mobility to allow discharge.     The patient  comes from ALF, reports ambulatory  with RW PTA.  Patient required min assistance to mobilize to [facility] then to recliner.  Patient will benefit from continued inpatient follow up therapy, <3 hours/day  BP pre 104/32,  post mobility 146/43, 99 % RA HR 103-117.       If plan is discharge home, recommend the following: A little help with walking and/or transfers;A little help with bathing/dressing/bathroom;Assistance with cooking/housework;Assist for transportation;Help with stairs or ramp for entrance   Can travel by private vehicle        Equipment Recommendations None recommended by PT  Recommendations for Other Services       Functional Status Assessment Patient has had a recent decline in their functional status and demonstrates the ability to make significant improvements in function in a reasonable and predictable amount of time.     Precautions / Restrictions  Precautions Precautions: Fall Precaution Comments: incontinent Restrictions Weight Bearing Restrictions: No      Mobility  Bed Mobility Overal bed mobility: Needs Assistance Bed Mobility: Supine to Sit     Supine to sit: Min assist     General bed mobility comments: light HHA to pull  to sitting upright and scoot to bed edge    Transfers Overall transfer level: Needs assistance Equipment used: Rolling walker (2 wheels) Transfers: Sit to/from Stand, Bed to chair/wheelchair/BSC Sit to Stand: Min assist   Step pivot transfers: Min assist       General transfer comment: multimodal cues to  stand at RW, step to [facility] ,reach back, stands and steps to recliner(180 turn) Min assist for safety.    Ambulation/Gait                  Stairs            Wheelchair Mobility     Tilt Bed    Modified Rankin (Stroke Patients Only)       Balance Overall balance assessment: Needs assistance Sitting-balance support: Bilateral upper extremity supported, Feet supported Sitting balance-Leahy Scale: Fair     Standing balance support: No upper extremity supported, Bilateral upper extremity supported, During functional activity Standing balance-Leahy Scale: Poor Standing balance comment: reliant on  UE suport                             Pertinent Vitals/Pain Pain Assessment Pain Assessment: No/denies pain    Home Living Family/patient expects to be discharged to:: Assisted living                 Home Equipment:  Rolling Walker (2 wheels)      Prior Function Prior Level of Function : Needs assist       Physical Assist : Mobility (physical)     Mobility Comments: pt. reports independent with RW, toileting, ADLs Comments: has assist with stockings and shoes PRN     Extremity/Trunk Assessment        Lower Extremity Assessment Lower Extremity Assessment: Generalized weakness    Cervical / Trunk Assessment Cervical / Trunk  Assessment: Kyphotic  Communication   Communication Communication: Hearing impairment Cueing Techniques: Gestural cues;Verbal cues;Tactile cues  Cognition Arousal: Alert Behavior During Therapy: WFL for tasks assessed/performed, Anxious Overall Cognitive Status: No family/caregiver present to determine baseline cognitive functioning Area of Impairment: Orientation                 Orientation Level: Time             General Comments: Frequent redirection  as patient verbose, does follow sinple directions when heard and  focussed        General Comments      Exercises     Assessment/Plan    PT Assessment Patient needs continued PT services  PT Problem List Decreased strength;Decreased mobility;Decreased activity tolerance;Decreased safety awareness       PT Treatment Interventions DME instruction;Therapeutic activities;Gait training;Therapeutic exercise;Patient/family education;Functional mobility training    PT Goals (Current goals can be found in the Care Plan section)  Acute Rehab PT Goals Patient Stated Goal: agreed to OOB PT Goal Formulation: With patient Time For Goal Achievement: 05/06/23 Potential to Achieve Goals: Fair    Frequency Min 1X/week     Co-evaluation               AM-PAC PT "6 Clicks" Mobility  Outcome Measure Help needed turning from your back to your side while in a flat bed without using bedrails?: A Little Help needed moving from lying on your back to sitting on the side of a flat bed without using bedrails?: A Little Help needed moving to and from a bed to a chair (including a wheelchair)?: A Little Help needed standing up from a chair using your arms (e.g., wheelchair or bedside chair)?: A Little Help needed to walk in hospital room?: Total Help needed climbing 3-5 steps with a railing? : Total 6 Click Score: 14    End of Session Equipment Utilized During Treatment: Gait belt Activity Tolerance: Patient tolerated  treatment well Patient left: with call bell/phone within reach;in chair;with chair alarm set Nurse Communication: Mobility status PT Visit Diagnosis: Unsteadiness on feet (R26.81);Difficulty in walking, not elsewhere classified (R26.2)    Time: 2536-6440 PT Time Calculation (min) (ACUTE ONLY): 22 min   Charges:   PT Evaluation $PT Eval Low Complexity: 1 Low   PT General Charges $$ ACUTE PT VISIT: 1 Visit         Blanchard Kelch PT Acute Rehabilitation Services Office [phone] Weekend [phone]   Rada Hay 04/22/2023, 10:52 AM

## 2023-04-23 DIAGNOSIS — K922 Gastrointestinal hemorrhage, unspecified: Secondary | ICD-10-CM | POA: Diagnosis not present

## 2023-04-23 LAB — BASIC METABOLIC PANEL
Anion gap: 7 (ref 5–15)
BUN: 6 mg/dL — ABNORMAL LOW (ref 8–23)
CO2: 24 mmol/L (ref 22–32)
Calcium: 8.5 mg/dL — ABNORMAL LOW (ref 8.9–10.3)
Chloride: 110 mmol/L (ref 98–111)
Creatinine, Ser: 0.33 mg/dL — ABNORMAL LOW (ref 0.44–1.00)
GFR, Estimated: >60 mL/min (ref >60)
Glucose, Bld: 120 mg/dL — ABNORMAL HIGH (ref 70–99)
Potassium: 3.6 mmol/L (ref 3.5–5.1)
Sodium: 141 mmol/L (ref 135–145)

## 2023-04-23 LAB — CBC
HCT: 28.9 % — ABNORMAL LOW (ref 36.0–46.0)
Hemoglobin: 8.9 g/dL — ABNORMAL LOW (ref 12.0–15.0)
MCH: 30.8 pg (ref 26.0–34.0)
MCHC: 30.8 g/dL (ref 30.0–36.0)
MCV: 100 fL (ref 80.0–100.0)
Platelets: 153 10*3/uL (ref 150–400)
RBC: 2.89 MIL/uL — ABNORMAL LOW (ref 3.87–5.11)
RDW: 17.5 % — ABNORMAL HIGH (ref 11.5–15.5)
WBC: 6.1 10*3/uL (ref 4.0–10.5)
nRBC: 0.3 % — ABNORMAL HIGH (ref 0.0–0.2)

## 2023-04-23 MED ORDER — PEG 3350-KCL-NA BICARB-NACL 420 G PO SOLR
4000.0000 mL | Freq: Once | ORAL | Status: AC
  Administered 2023-04-24: 4000 mL via ORAL
  Filled 2023-04-23: qty 4000

## 2023-04-23 NOTE — Plan of Care (Signed)
?  Problem: Clinical Measurements: ?Goal: Respiratory complications will improve ?Outcome: Progressing ?Goal: Cardiovascular complication will be avoided ?Outcome: Progressing ?  ?Problem: Activity: ?Goal: Risk for activity intolerance will decrease ?Outcome: Progressing ?  ?Problem: Pain Managment: ?Goal: General experience of comfort will improve ?Outcome: Progressing ?  ?Problem: Safety: ?Goal: Ability to remain free from injury will improve ?Outcome: Progressing ?  ?

## 2023-04-23 NOTE — Progress Notes (Signed)
PROGRESS NOTE Betty MARTINCIC  ZOX:[id number] DOB: Sep 22, 1940 DOA: 04/19/2023 PCP: Octavia Heir, NP  Brief Narrative/Hospital Course: 82 y.o.f w/ anxiety,depression, chronic diastolic heart failure, osteoarthritis, glaucoma, hyperlipidemia, hypertension, pulmonary nodule, restless leg syndrome, osteoporosis, COPD, aortic arthrosclerosis, GERD, history of diverticular bleed with embolization, UGI bleed, chronic iron deficiency anemia due to chronic blood loss with multiple admissions due to GI bleed most recently seen from 03/27/2023 until 03/29/2023 with recent EGD in August 2024 that showed gastritis who presented to the ED from facility due to low hemoglobin level associated with several melena episodes.  In WJ:XBJYNW Temp-98.1 F,HR-78,RR 16, BP 111/48 mmHg O2 sat 98% on RA.The patient received 500 mL normal saline bolus.Labs:WBC-7.9,HB-5.9 g/dL with an MCV of 295.6 fL and platelets 554.  PT 16.2 and INR 1.3.FOBT positive.CMP showed normal electrolytes after calcium correction, glucose 128, BUN 24 and creatinine 0.49 mg/deciliter.  AST 31 units/L.  Total protein was 5.8 and albumin 3.4 g/dL.The rest of the LFTs were unremarkable. CTA GI bleed>no evidence of active GI bleed by CT,Cholelithiasis without cholecystitis.  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  Aortic atherosclerosis. 2u PRBC ordered and admitted. Seen by GI. EGD done 10/21> Acute gastritis,multiple gastric polyps.Normal examined duodenum.  CamPill placed and monitored in SDU and completed 10/22 Her hemoglobin has been stable above 8. Capsule endoscopy 10/22> focal area of red blood in proximal colon GI planning to repeat colonoscopy with 2-day prep   Subjective: Patient seen and examined this morning She is alert awake hard of hearing, has no complaints. Afebrile BP stable, labs with hemoglobin stable uptrending 8.9 g.  Stable renal function.     Assessment and Plan: Principal Problem:   UGI  bleed Active Problems:   Depression with anxiety   Hyperlipidemia   Glaucoma   COPD (chronic obstructive pulmonary disease) (HCC)   Iron deficiency anemia   Hypertension   Pulmonary nodule 1 cm or greater in diameter   GERD (gastroesophageal reflux disease)   Class 1 obesity with body mass index (BMI) of 34.0 to 34.9 in adult   Thoracic aorta atherosclerosis (HCC)   Severe anemia  chronic anemia due to GI blood loss Melena  GERD History of upper GI bleed: Recent admission in September for GI bleeding had EGD in August showing gastritis.S/p 2 units PRBC so far. Appreciate GI input.  EGD done 10/21> Acute gastritis,multiple gastric polyps.Normal examined duodenum. CamPill placed and monitored in SDU>Capsule endoscopy 10/22> focal area of red blood in proximal colon GI planning to repeat colonoscopy with 2-day prep. Continue PPI BID, monitor hemoglobin and transfuse if < 7 g hb  Hypokalemia: Resolved Recent Labs  Lab 04/19/23 1227 04/20/23 0739 04/21/23 0241 04/22/23 0331 04/23/23 0319  K 3.6 3.1* 3.0* 3.6 3.6   Thrombocytopenia: Likely from acute illness.  Resolved.   Anxiety/depression: Mood is stable, Cont on sertraline  HLD Thoracic aortic atherosclerosis: Stable, continue statin  HTN: BP controlled/soft.hold metoprolol due to #1.  COPD: Not in exacerbation, CONT Nebs pnr  Pulmonary nodule > 1 cm: Need follow-up with PCP/pulmonary as OP if she desires  Obesity with BMI of 34: Will benefit with weight loss  Deconditioning/debility Resident of ALF: PT OT evaluation requested for disposition> advised skilled nursing facility  DVT prophylaxis: SCDs Start: 04/19/23 1443 Code Status:   Code Status: Do not attempt resuscitation (DNR) PRE-ARREST INTERVENTIONS DESIRED Family Communication: plan of care discussed with patient/no family at bedside. I had updated patient's son Dr.Mike Chirico previously and has been  informed by GI 10/22  Patient status is: The  patient because of severe anemia Level of care: Stepdown  Dispo: The patient is from: alf            Anticipated disposition: tbd Objective: Vitals last 24 hrs: Vitals:   04/23/23 0854 04/23/23 0900 04/23/23 1000 04/23/23 1100  BP: 99/86  (!) 121/46 (!) 118/44  Pulse: 83 77 69 69  Resp: (!) 22 20 15 18   Temp:      TempSrc:      SpO2: 98% 98% 98% 98%  Weight:      Height:       Weight change:   Physical Examination: General exam: alert awake, hard of hearing HEENT:Oral mucosa moist, Ear/Nose WNL grossly Respiratory system: Bilaterally clear BS,no use of accessory muscle Cardiovascular system: S1 & S2 +, No JVD. Gastrointestinal system: Abdomen soft,NT,ND, BS+ Nervous System: Alert, awake, moving all extremities,and following commands. Extremities: LE edema neg,distal peripheral pulses palpable and warm.  Skin: No rashes,no icterus. MSK: Normal muscle bulk,tone, power   Medications reviewed:  Scheduled Meds:  Chlorhexidine Gluconate Cloth  6 each Topical Daily   dorzolamide-timolol  1 drop Both Eyes BID   influenza vaccine adjuvanted  0.5 mL Intramuscular Tomorrow-1000   latanoprost  1 drop Both Eyes QHS   melatonin  5 mg Oral QHS   Netarsudil Dimesylate  1 drop Both Eyes QHS   oxybutynin  5 mg Oral QHS   pantoprazole  40 mg Intravenous Q12H   polyethylene glycol-electrolytes  4,000 mL Oral Once   sertraline  75 mg Oral Daily   simvastatin  20 mg Oral QPM   umeclidinium-vilanterol  1 puff Inhalation q morning   Continuous Infusions:  sodium chloride 10 mL/hr at 04/23/23 1610       Diet Order             Diet NPO time specified  Diet effective midnight           Diet clear liquid Fluid consistency: Thin  Diet effective now                  Intake/Output Summary (Last 24 hours) at 04/23/2023 1123 Last data filed at 04/23/2023 1022 Gross per 24 hour  Intake 1654.39 ml  Output 1901 ml  Net -246.61 ml   Net IO Since Admission: 574.43 mL [04/23/23 1123]   Wt Readings from Last 3 Encounters:  04/19/23 81.6 kg  04/16/23 82.3 kg  04/10/23 82.3 kg     Unresulted Labs (From admission, onward)     Start     Ordered   04/21/23 0500  Basic metabolic panel  Daily,   R     Question:  Specimen collection method  Answer:  Lab=Lab collect   04/20/23 0741   04/21/23 0500  CBC  Daily,   R     Question:  Specimen collection method  Answer:  Lab=Lab collect   04/20/23 0741          Data Reviewed: I have personally reviewed following labs and imaging studies CBC: Recent Labs  Lab 04/19/23 1144 04/19/23 1227 04/20/23 0739 04/21/23 0241 04/21/23 1727 04/22/23 0331 04/23/23 0319  WBC 7.9  --  9.2 6.0  --  6.9 6.1  NEUTROABS 5.3  --   --   --   --   --   --   HGB 5.9*   < > 8.5* 7.8* 8.6* 8.1* 8.9*  HCT 19.2*   < > 26.0* 24.3*  27.7* 26.1* 28.9*  MCV 103.8*  --  93.9 95.7  --  98.9 100.0  PLT 154  --  117* 124*  --  134* 153   < > = values in this interval not displayed.   Basic Metabolic Panel: Recent Labs  Lab 04/19/23 1144 04/19/23 1227 04/20/23 0739 04/21/23 0241 04/22/23 0331 04/23/23 0319  NA 139 141 142 140 140 141  K 3.5 3.6 3.1* 3.0* 3.6 3.6  CL 106 104 113* 110 110 110  CO2 25  --  25 24 24 24   GLUCOSE 128* 124* 108* 120* 118* 120*  BUN 24* 22 24* 13 6* 6*  CREATININE 0.49 0.60 0.31* 0.48 0.43* 0.33*  CALCIUM 8.5*  --  7.6* 7.9* 7.9* 8.5*   GFR: Estimated Creatinine Clearance: 50.7 mL/min (A) (by C-G formula based on SCr of 0.33 mg/dL (L)). Liver Function Tests: Recent Labs  Lab 04/19/23 1144 04/20/23 0739  AST 16 16  ALT 9 9  ALKPHOS 31* 27*  BILITOT 0.6 1.1  PROT 5.8* 5.0*  ALBUMIN 3.4* 3.0*  No results for input(s): "LIPASE", "AMYLASE" in the last 168 hours. No results for input(s): "AMMONIA" in the last 168 hours. Coagulation Profile: Recent Labs  Lab 04/19/23 1144  INR 1.3*  No results for input(s): "PROCALCITON", "LATICACIDVEN" in the last 168 hours.  Recent Results (from the past 240 hour(s))   MRSA Next Gen by PCR, Nasal     Status: None   Collection Time: 04/19/23  4:50 PM   Specimen: Nasal Mucosa; Nasal Swab  Result Value Ref Range Status   MRSA by PCR Next Gen NOT DETECTED NOT DETECTED Final    Comment: (NOTE) The GeneXpert MRSA Assay (FDA approved for NASAL specimens only), is one component of a comprehensive MRSA colonization surveillance program. It is not intended to diagnose MRSA infection nor to guide or monitor treatment for MRSA infections. Test performance is not FDA approved in patients less than 87 years old. Performed at [facility], [address] [place], [place] [address]     Antimicrobials: Anti-infectives (From admission, onward)    None      Culture/Microbiology    Component Value Date/Time   SDES BLOOD RIGHT ANTECUBITAL 09/06/2017 0950   SPECREQUEST  09/06/2017 0950    BOTTLES DRAWN AEROBIC AND ANAEROBIC Blood Culture adequate volume   CULT  09/06/2017 0950    NO GROWTH 5 DAYS Performed at [facility], [address] [place], [place] [address]    REPTSTATUS 09/11/2017 FINAL 09/06/2017 8469  Radiology Studies: No results found.  LOS: 4 days  Lanae Boast, MD [facility]  04/23/2023, 11:23 AM

## 2023-04-23 NOTE — Progress Notes (Signed)
[facility] Gastroenterology Progress Note  [name] 82 y.o. 11/17/1940   Subjective: Frustrated with feeling helpless. Denies abdominal pain. Black and brown loose stools overnight. Nursing in room.  Objective: Vital signs: Vitals:   04/23/23 0600 04/23/23 0741  BP: (!) 109/33 (!) 120/42  Pulse: 71 74  Resp: 17 17  Temp:  98 F (36.7 C)  SpO2: 97% 99%    Physical Exam: Gen: lethargic, elderly, well-nourished, no acute distress  HEENT: anicteric sclera CV: RRR Chest: CTA B Abd: soft, nontender, nondistended, +BS Ext: no edema  Lab Results: Recent Labs    04/22/23 0331 04/23/23 0319  NA 140 141  K 3.6 3.6  CL 110 110  CO2 24 24  GLUCOSE 118* 120*  BUN 6* 6*  CREATININE 0.43* 0.33*  CALCIUM 7.9* 8.5*   No results for input(s): "AST", "ALT", "ALKPHOS", "BILITOT", "PROT", "ALBUMIN" in the last 72 hours. Recent Labs    04/22/23 0331 04/23/23 0319  WBC 6.9 6.1  HGB 8.1* 8.9*  HCT 26.1* 28.9*  MCV 98.9 100.0  PLT 134* 153      Assessment/Plan: Obscure overt GI bleeding - Two day colon prep (2nd day today). Colonoscopy tomorrow morning. Clear liquid diet today and NPO p MN. Supportive care.   [name] 04/23/2023, 9:40 AM  Questions please call [phone] ID: [name], female   DOB: [birth year], 82 y.o.   MRN: [id number]

## 2023-04-24 ENCOUNTER — Encounter (HOSPITAL_COMMUNITY): Payer: Self-pay | Admitting: Gastroenterology

## 2023-04-24 DIAGNOSIS — K922 Gastrointestinal hemorrhage, unspecified: Secondary | ICD-10-CM | POA: Diagnosis not present

## 2023-04-24 LAB — CBC
HCT: 26.7 % — ABNORMAL LOW (ref 36.0–46.0)
Hemoglobin: 8.3 g/dL — ABNORMAL LOW (ref 12.0–15.0)
MCH: 30.6 pg (ref 26.0–34.0)
MCHC: 31.1 g/dL (ref 30.0–36.0)
MCV: 98.5 fL (ref 80.0–100.0)
Platelets: 163 10*3/uL (ref 150–400)
RBC: 2.71 MIL/uL — ABNORMAL LOW (ref 3.87–5.11)
RDW: 17.3 % — ABNORMAL HIGH (ref 11.5–15.5)
WBC: 5.5 10*3/uL (ref 4.0–10.5)
nRBC: 0 % (ref 0.0–0.2)

## 2023-04-24 LAB — BASIC METABOLIC PANEL
Anion gap: 9 (ref 5–15)
BUN: <5 mg/dL — ABNORMAL LOW (ref 8–23)
CO2: 23 mmol/L (ref 22–32)
Calcium: 8.3 mg/dL — ABNORMAL LOW (ref 8.9–10.3)
Chloride: 109 mmol/L (ref 98–111)
Creatinine, Ser: 0.45 mg/dL (ref 0.44–1.00)
GFR, Estimated: >60 mL/min (ref >60)
Glucose, Bld: 119 mg/dL — ABNORMAL HIGH (ref 70–99)
Potassium: 3.5 mmol/L (ref 3.5–5.1)
Sodium: 141 mmol/L (ref 135–145)

## 2023-04-24 NOTE — H&P (View-Only) (Signed)
[facility] Gastroenterology Progress Note  [name] GNFAOZ 82 y.o. 1940/08/22   Subjective: Brown loose stool. Denies abdominal pain. Drinking prep.  Objective: Vital signs: Vitals:   04/24/23 0800 04/24/23 0926  BP:    Pulse:    Resp:    Temp: 98 F (36.7 C)   SpO2:  97%    Physical Exam: Gen: lethargic, elderly, no acute distress  HEENT: anicteric sclera CV: RRR Chest: CTA B Abd: soft, nontender, nondistended, +BS Ext: no edema  Lab Results: Recent Labs    04/23/23 0319 04/24/23 0329  NA 141 141  K 3.6 3.5  CL 110 109  CO2 24 23  GLUCOSE 120* 119*  BUN 6* <5*  CREATININE 0.33* 0.45  CALCIUM 8.5* 8.3*   No results for input(s): "AST", "ALT", "ALKPHOS", "BILITOT", "PROT", "ALBUMIN" in the last 72 hours. Recent Labs    04/23/23 0319 04/24/23 0329  WBC 6.1 5.5  HGB 8.9* 8.3*  HCT 28.9* 26.7*  MCV 100.0 98.5  PLT 153 163      Assessment/Plan: Obscure overt GI bleeding - 2nd day of colon prep today. Colonoscopy tomorrow morning. Supportive care.   [name] 04/24/2023, 9:30 AM  Questions please call [phone] ID: [name], female   DOB: 06-07-1941, 82 y.o.   MRN: [id number]

## 2023-04-24 NOTE — TOC Progression Note (Addendum)
Transition of Care [facility]) - Progression Note    Patient Details  Name: [name] MRN: [id number] Date of Birth: 1941-06-21  [facility]) CM/SW Contact  Coralyn Helling, [place] Phone Number: 04/24/2023, 11:28 AM  Clinical Narrative:    [facility] consulted for SNF placement. Patient from [facility] ALF. Patient refusing SNF prefers to go back to ALF with PT. Left message for Stephens Shire, ALF coordinator [phone] to determine if patient can return to ALF. Awaiting call back.   12:17 PM Return to facility will depend on how patient works with PT closer to dc. Clydie Braun reviewing current PT notes.   TOC will continue to follow for disposition.   Expected Discharge Plan: Long Term Nursing Home Barriers to Discharge: Continued Medical Work up  Expected Discharge Plan and Services In-house Referral: NA Discharge Planning Services: NA Post Acute Care Choice: Resumption of Svcs/PTA Provider Living arrangements for the past 2 months: Assisted Living Facility                 DME Arranged: N/A DME Agency: NA                   Social Determinants of Health (SDOH) Interventions SDOH Screenings   Food Insecurity: No Food Insecurity (04/22/2023)  Housing: Low Risk  (04/22/2023)  Transportation Needs: No Transportation Needs (04/22/2023)  Utilities: Not At Risk (04/22/2023)  Alcohol Screen: Low Risk  (04/04/2023)  Depression (PHQ2-9): Low Risk  (04/04/2023)  Financial Resource Strain: Low Risk  (04/04/2023)  Physical Activity: Insufficiently Active (04/04/2023)  Social Connections: Moderately Isolated (04/04/2023)  Stress: No Stress Concern Present (04/04/2023)  Tobacco Use: Medium Risk (04/21/2023)  Health Literacy: Adequate Health Literacy (04/04/2023)    Readmission Risk Interventions    04/21/2023    2:05 PM 03/29/2023    9:45 AM 01/07/2023    1:24 PM  Readmission Risk Prevention Plan  Transportation Screening Complete Complete Complete  PCP or Specialist Appt  within 5-7 Days  Complete Complete  PCP or Specialist Appt within 3-5 Days Complete    Home Care Screening  Complete Complete  Medication Review (RN CM)  Complete Complete  HRI or Home Care Consult Complete    Social Work Consult for Recovery Care Planning/Counseling Complete    Palliative Care Screening Not Applicable    Medication Review Oceanographer) Complete

## 2023-04-24 NOTE — Progress Notes (Signed)
[facility] Gastroenterology Progress Note  [name] GNFAOZ 82 y.o. 1940/08/22   Subjective: Brown loose stool. Denies abdominal pain. Drinking prep.  Objective: Vital signs: Vitals:   04/24/23 0800 04/24/23 0926  BP:    Pulse:    Resp:    Temp: 98 F (36.7 C)   SpO2:  97%    Physical Exam: Gen: lethargic, elderly, no acute distress  HEENT: anicteric sclera CV: RRR Chest: CTA B Abd: soft, nontender, nondistended, +BS Ext: no edema  Lab Results: Recent Labs    04/23/23 0319 04/24/23 0329  NA 141 141  K 3.6 3.5  CL 110 109  CO2 24 23  GLUCOSE 120* 119*  BUN 6* <5*  CREATININE 0.33* 0.45  CALCIUM 8.5* 8.3*   No results for input(s): "AST", "ALT", "ALKPHOS", "BILITOT", "PROT", "ALBUMIN" in the last 72 hours. Recent Labs    04/23/23 0319 04/24/23 0329  WBC 6.1 5.5  HGB 8.9* 8.3*  HCT 28.9* 26.7*  MCV 100.0 98.5  PLT 153 163      Assessment/Plan: Obscure overt GI bleeding - 2nd day of colon prep today. Colonoscopy tomorrow morning. Supportive care.   [name] 04/24/2023, 9:30 AM  Questions please call [phone] ID: [name], female   DOB: 06-07-1941, 82 y.o.   MRN: [id number]

## 2023-04-24 NOTE — Progress Notes (Signed)
PT Cancellation Note  Patient Details Name: [name] MRN: [id number] DOB: 01-01-1941   Cancelled Treatment:    Reason Eval/Treat Not Completed: Medical issues which prohibited therapy Bowel prep I progress will check back another day.  Blanchard Kelch PT Acute Rehabilitation Services Office [phone] Weekend [phone]   Rada Hay 04/24/2023, 10:14 AM

## 2023-04-24 NOTE — Plan of Care (Signed)
Patient is taking bowel prep for colonoscopy and had several incontinent episode of  bowels and urine.   Problem: Elimination: Goal: Will not experience complications related to bowel motility Outcome: Not Progressing Goal: Will not experience complications related to urinary retention Outcome: Not Progressing   Problem: Skin Integrity: Goal: Risk for impaired skin integrity will decrease Outcome: Not Progressing

## 2023-04-24 NOTE — Progress Notes (Signed)
PROGRESS NOTE CHARLETTE [name]  WUJ:[id number] DOB: 25-Jun-1941 DOA: 04/19/2023 PCP: Octavia Heir, NP  Brief Narrative/Hospital Course: 82 y.o.f w/ anxiety,depression, chronic diastolic heart failure, osteoarthritis, glaucoma, hyperlipidemia, hypertension, pulmonary nodule, restless leg syndrome, osteoporosis, COPD, aortic arthrosclerosis, GERD, history of diverticular bleed with embolization, UGI bleed, chronic iron deficiency anemia due to chronic blood loss with multiple admissions due to GI bleed most recently seen from 03/27/2023 until 03/29/2023 with recent EGD in August 2024 that showed gastritis who presented to the ED from facility due to low hemoglobin level associated with several melena episodes.  In NF:[id number] Temp-98.1 F,HR-78,RR 16, BP 111/48 mmHg O2 sat 98% on RA.The patient received 500 mL normal saline bolus.Labs:WBC-7.9,HB-5.9 g/dL with an MCV of 657.8 fL and platelets 554.  PT 16.2 and INR 1.3.FOBT positive.CMP showed normal electrolytes after calcium correction, glucose 128, BUN 24 and creatinine 0.49 mg/deciliter.  AST 31 units/L.  Total protein was 5.8 and albumin 3.4 g/dL.The rest of the LFTs were unremarkable. CTA GI bleed>no evidence of active GI bleed by CT,Cholelithiasis without cholecystitis.  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  Aortic atherosclerosis. 2u PRBC ordered and admitted. Seen by GI. EGD done 10/21> Acute gastritis,multiple gastric polyps.Normal examined duodenum.  CamPill placed and monitored in SDU and completed 10/22 Her hemoglobin has been stable above 8. Capsule endoscopy 10/22> focal area of red blood in proximal colon GI planning to repeat colonoscopy with 2-day prep on 04/24/23.  PT OT had seen the patient and advised skilled nursing facility upon discharge  Subjective: Patient seen and examined this morning  Alert awake oriented resting comfortably hard of hearing  Still drinking her colon prep  bowel not clear  yet Hemoglobin stable  Assessment and Plan: Principal Problem:   UGI bleed Active Problems:   Depression with anxiety   Hyperlipidemia   Glaucoma   COPD (chronic obstructive pulmonary disease) (HCC)   Iron deficiency anemia   Hypertension   Pulmonary nodule 1 cm or greater in diameter   GERD (gastroesophageal reflux disease)   Class 1 obesity with body mass index (BMI) of 34.0 to 34.9 in adult   Thoracic aorta atherosclerosis (HCC)   Severe anemia  chronic anemia due to GI blood loss Melena  GERD History of upper GI bleed: Recent admission in September for GI bleeding had EGD in August showing gastritis.S/p 2 units PRBC so far. Appreciate GI input.  EGD done 10/21>Acute gastritis,multiple gastric polyps.Normal examined duodenum. CamPill placed and monitored in SDU>Capsule endoscopy 10/22>focal area of red blood in proximal colon and planning for colonoscopy,waiting to finish colon prep, colonoscopy will be tomorrow, hemoglobin remains stable continue on PPI BID, monitor hemoglobin and transfuse if < 7 g hb.  Hypokalemia: Resolved. Recent Labs  Lab 04/20/23 0739 04/21/23 0241 04/22/23 0331 04/23/23 0319 04/24/23 0329  K 3.1* 3.0* 3.6 3.6 3.5   Thrombocytopenia: Likely from acute illness.  Resolved.     Anxiety/depression: Mood is stable, Cont on sertraline.  HLD Thoracic aortic atherosclerosis: Stable, continue statin.  HTN: BP controlled.  Holding metoprolol for now   COPD: Not in exacerbation, CONT Nebs pnr  Pulmonary nodule > 1 cm: Need follow-up with PCP/pulmonary as OP if she desires  Obesity with BMI of 34: Will benefit with weight loss  Deconditioning/debility Resident of ALF: PT OT evaluation requested for disposition> advised skilled nursing facility  DVT prophylaxis: SCDs Start: 04/19/23 1443 Code Status:   Code Status: Do not attempt resuscitation (DNR) PRE-ARREST INTERVENTIONS DESIRED Family Communication:  plan of care discussed with  patient/no family at bedside. I had updated patient's son Dr.Mike Feltes previously and has been informed by GI 10/22, updated son on phone 10/24 am.  Patient status is: The patient because of severe anemia Level of care: Stepdown  Dispo: The patient is from: ALF            Anticipated disposition: SNF but she wants to return to ALF.  Objective: Vitals last 24 hrs: Vitals:   04/24/23 0600 04/24/23 0800 04/24/23 0900 04/24/23 0926  BP: (!) 106/34 (!) 119/46    Pulse: 64 77 90 95  Resp: 12 (!) 24 (!) 22 19  Temp:  98 F (36.7 C)    TempSrc:  Oral    SpO2: 96% 94% 91% 97%  Weight:      Height:       Weight change:   Physical Examination: General exam: alert awake, HEENT:Oral mucosa moist, Ear/Nose WNL grossly Respiratory system: Bilaterally clear BS,no use of accessory muscle Cardiovascular system: S1 & S2 +, No JVD. Gastrointestinal system: Abdomen soft,NT,ND, BS+ Nervous System: Alert, awake, moving all extremities,and following commands. Extremities: LE edema neg,distal peripheral pulses palpable and warm.  Skin: No rashes,no icterus. MSK: Normal muscle bulk,tone, power   Medications reviewed:  Scheduled Meds:  Chlorhexidine Gluconate Cloth  6 each Topical Daily   dorzolamide-timolol  1 drop Both Eyes BID   influenza vaccine adjuvanted  0.5 mL Intramuscular Tomorrow-1000   latanoprost  1 drop Both Eyes QHS   melatonin  5 mg Oral QHS   Netarsudil Dimesylate  1 drop Both Eyes QHS   oxybutynin  5 mg Oral QHS   pantoprazole  40 mg Intravenous Q12H   polyethylene glycol-electrolytes  4,000 mL Oral Once   sertraline  75 mg Oral Daily   simvastatin  20 mg Oral QPM   umeclidinium-vilanterol  1 puff Inhalation q morning   Continuous Infusions:       Diet Order             Diet NPO time specified  Diet effective midnight           Diet clear liquid Fluid consistency: Thin  Diet effective now                  Intake/Output Summary (Last 24 hours) at  04/24/2023 1104 Last data filed at 04/24/2023 0255 Gross per 24 hour  Intake 620.44 ml  Output 1850 ml  Net -1229.56 ml   Net IO Since Admission: -655.13 mL [04/24/23 1104]  Wt Readings from Last 3 Encounters:  04/19/23 81.6 kg  04/16/23 82.3 kg  04/10/23 82.3 kg     Unresulted Labs (From admission, onward)     Start     Ordered   04/21/23 0500  Basic metabolic panel  Daily,   R     Question:  Specimen collection method  Answer:  Lab=Lab collect   04/20/23 0741   04/21/23 0500  CBC  Daily,   R     Question:  Specimen collection method  Answer:  Lab=Lab collect   04/20/23 0741          Data Reviewed: I have personally reviewed following labs and imaging studies CBC: Recent Labs  Lab 04/19/23 1144 04/19/23 1227 04/20/23 0739 04/21/23 0241 04/21/23 1727 04/22/23 0331 04/23/23 0319 04/24/23 0329  WBC 7.9  --  9.2 6.0  --  6.9 6.1 5.5  NEUTROABS 5.3  --   --   --   --   --   --   --  HGB 5.9*   < > 8.5* 7.8* 8.6* 8.1* 8.9* 8.3*  HCT 19.2*   < > 26.0* 24.3* 27.7* 26.1* 28.9* 26.7*  MCV 103.8*  --  93.9 95.7  --  98.9 100.0 98.5  PLT 154  --  117* 124*  --  134* 153 163   < > = values in this interval not displayed.   Basic Metabolic Panel: Recent Labs  Lab 04/20/23 0739 04/21/23 0241 04/22/23 0331 04/23/23 0319 04/24/23 0329  NA 142 140 140 141 141  K 3.1* 3.0* 3.6 3.6 3.5  CL 113* 110 110 110 109  CO2 25 24 24 24 23   GLUCOSE 108* 120* 118* 120* 119*  BUN 24* 13 6* 6* <5*  CREATININE 0.31* 0.48 0.43* 0.33* 0.45  CALCIUM 7.6* 7.9* 7.9* 8.5* 8.3*   GFR: Estimated Creatinine Clearance: 50.7 mL/min (by C-G formula based on SCr of 0.45 mg/dL). Liver Function Tests: Recent Labs  Lab 04/19/23 1144 04/20/23 0739  AST 16 16  ALT 9 9  ALKPHOS 31* 27*  BILITOT 0.6 1.1  PROT 5.8* 5.0*  ALBUMIN 3.4* 3.0*  No results for input(s): "LIPASE", "AMYLASE" in the last 168 hours. No results for input(s): "AMMONIA" in the last 168 hours. Coagulation  Profile: Recent Labs  Lab 04/19/23 1144  INR 1.3*  No results for input(s): "PROCALCITON", "LATICACIDVEN" in the last 168 hours.  Recent Results (from the past 240 hour(s))  MRSA Next Gen by PCR, Nasal     Status: None   Collection Time: 04/19/23  4:50 PM   Specimen: Nasal Mucosa; Nasal Swab  Result Value Ref Range Status   MRSA by PCR Next Gen NOT DETECTED NOT DETECTED Final    Comment: (NOTE) The GeneXpert MRSA Assay (FDA approved for NASAL specimens only), is one component of a comprehensive MRSA colonization surveillance program. It is not intended to diagnose MRSA infection nor to guide or monitor treatment for MRSA infections. Test performance is not FDA approved in patients less than 35 years old. Performed at [facility], [address], [place], [place] [address]     Antimicrobials: Anti-infectives (From admission, onward)    None      Culture/Microbiology    Component Value Date/Time   SDES BLOOD RIGHT ANTECUBITAL 09/06/2017 0950   SPECREQUEST  09/06/2017 0950    BOTTLES DRAWN AEROBIC AND ANAEROBIC Blood Culture adequate volume   CULT  09/06/2017 0950    NO GROWTH 5 DAYS Performed at [facility], [address] [place], [place] [address]    REPTSTATUS 09/11/2017 FINAL 09/06/2017 0981  Radiology Studies: No results found.  LOS: 5 days  Lanae Boast, MD [facility]  04/24/2023, 11:04 AM

## 2023-04-25 ENCOUNTER — Encounter (HOSPITAL_COMMUNITY)
Admission: EM | Disposition: A | Discharge status: Skilled Nursing Facility | Payer: Self-pay | Source: Skilled Nursing Facility | Attending: Internal Medicine

## 2023-04-25 ENCOUNTER — Inpatient Hospital Stay (HOSPITAL_COMMUNITY): Payer: Medicare HMO | Admitting: Certified Registered"

## 2023-04-25 ENCOUNTER — Encounter (HOSPITAL_COMMUNITY): Payer: Self-pay | Admitting: Internal Medicine

## 2023-04-25 DIAGNOSIS — K52831 Collagenous colitis: Secondary | ICD-10-CM | POA: Diagnosis not present

## 2023-04-25 DIAGNOSIS — J449 Chronic obstructive pulmonary disease, unspecified: Secondary | ICD-10-CM | POA: Diagnosis not present

## 2023-04-25 DIAGNOSIS — K633 Ulcer of intestine: Secondary | ICD-10-CM

## 2023-04-25 DIAGNOSIS — K922 Gastrointestinal hemorrhage, unspecified: Secondary | ICD-10-CM | POA: Diagnosis not present

## 2023-04-25 HISTORY — PX: COLONOSCOPY WITH PROPOFOL: SHX5780

## 2023-04-25 HISTORY — PX: BIOPSY: SHX5522

## 2023-04-25 LAB — BASIC METABOLIC PANEL
Anion gap: 6 (ref 5–15)
BUN: 6 mg/dL — ABNORMAL LOW (ref 8–23)
CO2: 24 mmol/L (ref 22–32)
Calcium: 8.2 mg/dL — ABNORMAL LOW (ref 8.9–10.3)
Chloride: 112 mmol/L — ABNORMAL HIGH (ref 98–111)
Creatinine, Ser: 0.44 mg/dL (ref 0.44–1.00)
GFR, Estimated: >60 mL/min (ref >60)
Glucose, Bld: 108 mg/dL — ABNORMAL HIGH (ref 70–99)
Potassium: 3.6 mmol/L (ref 3.5–5.1)
Sodium: 142 mmol/L (ref 135–145)

## 2023-04-25 LAB — CBC
HCT: 26.5 % — ABNORMAL LOW (ref 36.0–46.0)
Hemoglobin: 7.9 g/dL — ABNORMAL LOW (ref 12.0–15.0)
MCH: 30.3 pg (ref 26.0–34.0)
MCHC: 29.8 g/dL — ABNORMAL LOW (ref 30.0–36.0)
MCV: 101.5 fL — ABNORMAL HIGH (ref 80.0–100.0)
Platelets: 173 10*3/uL (ref 150–400)
RBC: 2.61 MIL/uL — ABNORMAL LOW (ref 3.87–5.11)
RDW: 17 % — ABNORMAL HIGH (ref 11.5–15.5)
WBC: 5 10*3/uL (ref 4.0–10.5)
nRBC: 0 % (ref 0.0–0.2)

## 2023-04-25 SURGERY — COLONOSCOPY WITH PROPOFOL
Anesthesia: Monitor Anesthesia Care

## 2023-04-25 MED ORDER — PROPOFOL 10 MG/ML IV BOLUS
INTRAVENOUS | Status: DC | PRN
  Administered 2023-04-25: 10 mg via INTRAVENOUS
  Administered 2023-04-25: 30 mg via INTRAVENOUS
  Administered 2023-04-25: 10 mg via INTRAVENOUS
  Administered 2023-04-25: 20 mg via INTRAVENOUS

## 2023-04-25 MED ORDER — PROPOFOL 500 MG/50ML IV EMUL
INTRAVENOUS | Status: DC | PRN
  Administered 2023-04-25: 50 ug/kg/min via INTRAVENOUS

## 2023-04-25 MED ORDER — LIDOCAINE 2% (20 MG/ML) 5 ML SYRINGE
INTRAMUSCULAR | Status: DC | PRN
  Administered 2023-04-25: 40 mg via INTRAVENOUS

## 2023-04-25 MED ORDER — SODIUM CHLORIDE 0.9 % IV SOLN: INTRAVENOUS | Status: DC | PRN

## 2023-04-25 MED ORDER — PHENYLEPHRINE HCL (PRESSORS) 10 MG/ML IV SOLN
INTRAVENOUS | Status: DC | PRN
  Administered 2023-04-25: 120 ug via INTRAVENOUS
  Administered 2023-04-25 (×2): 100 ug via INTRAVENOUS

## 2023-04-25 SURGICAL SUPPLY — 22 items

## 2023-04-25 NOTE — Interval H&P Note (Signed)
History and Physical Interval Note:  04/25/2023 10:16 AM  [name]  has presented today for surgery, with the diagnosis of gi bleed.  The various methods of treatment have been discussed with the patient and family. After consideration of risks, benefits and other options for treatment, the patient has consented to  Procedure(s): COLONOSCOPY WITH PROPOFOL (N/A) as a surgical intervention.  The patient's history has been reviewed, patient examined, no change in status, stable for surgery.  I have reviewed the patient's chart and labs.  Questions were answered to the patient's satisfaction.     [name]

## 2023-04-25 NOTE — Progress Notes (Signed)
Report called. Pt stable at time of transfer.

## 2023-04-25 NOTE — Progress Notes (Signed)
PROGRESS NOTE [name]  ZOX:[id number] DOB: May 22, 1941 DOA: 04/19/2023 PCP: Octavia Heir, NP  Brief Narrative/Hospital Course: 82 y.o.f w/ anxiety,depression, chronic diastolic heart failure, osteoarthritis, glaucoma, hyperlipidemia, hypertension, pulmonary nodule, restless leg syndrome, osteoporosis, COPD, aortic arthrosclerosis, GERD, history of diverticular bleed with embolization, UGI bleed, chronic iron deficiency anemia due to chronic blood loss with multiple admissions due to GI bleed most recently seen from 03/27/2023 until 03/29/2023 with recent EGD in August 2024 that showed gastritis who presented to the ED from facility due to low hemoglobin level associated with several melena episodes.  In WJ:[id number] Temp-98.1 F,HR-78,RR 16, BP 111/48 mmHg O2 sat 98% on RA.The patient received 500 mL normal saline bolus.Labs:WBC-7.9,HB-5.9 g/dL with an MCV of 295.6 fL and platelets 554.  PT 16.2 and INR 1.3.FOBT positive.CMP showed normal electrolytes after calcium correction, glucose 128, BUN 24 and creatinine 0.49 mg/deciliter.  AST 31 units/L.  Total protein was 5.8 and albumin 3.4 g/dL.The rest of the LFTs were unremarkable. CTA GI bleed>no evidence of active GI bleed by CT,Cholelithiasis without cholecystitis.  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  Aortic atherosclerosis. 2u PRBC ordered and admitted. Seen by GI. EGD done 10/21> Acute gastritis,multiple gastric polyps.Normal examined duodenum.  CamPill placed and monitored in SDU and completed 10/22 Her hemoglobin has been stable above 8. Capsule endoscopy 10/22> focal area of red blood in proximal colon GI planning to repeat colonoscopy with 2-day prep on 04/24/23.  PT OT had seen the patient and advised skilled nursing facility upon discharge  Subjective: Patient seen and examined this morning  Alert awake oriented resting comfortably hard of hearing  Still drinking her colon prep  bowel not clear  yet Hemoglobin stable  Assessment and Plan: Principal Problem:   UGI bleed Active Problems:   Depression with anxiety   Hyperlipidemia   Glaucoma   COPD (chronic obstructive pulmonary disease) (HCC)   Iron deficiency anemia   Hypertension   Pulmonary nodule 1 cm or greater in diameter   GERD (gastroesophageal reflux disease)   Class 1 obesity with body mass index (BMI) of 34.0 to 34.9 in adult   Thoracic aorta atherosclerosis (HCC)   Severe anemia  chronic anemia due to GI blood loss Melena  GERD History of upper GI bleed: Recent admission in September for GI bleeding had EGD in August showing gastritis.S/p 2 units PRBC so far. Appreciate GI input. EGD done 10/21>Acute gastritis,multiple gastric polyps.Normal examined duodenum. CamPill placed and monitored in SDU>Capsule endoscopy 10/22>focal area of red blood in proximal colon s/p colonoscopy after 2 day bowel prep 10/25>mild to mod proximal colonic ischemic looking mucosa - biopsies taken.  Per GI okay for diet biopsy will be followed up, to monitor overnight.  Continue PPI, monitor hemoglobin  Hypokalemia: Resolved.  Thrombocytopenia: Likely from acute illness.  Resolved.     Anxiety/depression: Mood is stable, Cont on sertraline.  HLD Thoracic aortic atherosclerosis: Stable, continue home statin.  HTN: BP is fairly controlled.  Holding metoprolol for now   COPD: Not in exacerbation, CONT Nebs pnr  Pulmonary nodule > 1 cm: Need follow-up with PCP/pulmonary as OP if she desires  Obesity with BMI of 34: Will benefit with weight loss  Deconditioning/debility Resident of ALF: PT OT evaluation requested for disposition> advised skilled nursing facility  DVT prophylaxis: SCDs Start: 04/19/23 1443 Code Status:   Code Status: Do not attempt resuscitation (DNR) PRE-ARREST INTERVENTIONS DESIRED Family Communication: plan of care discussed with patient/no family at bedside. I had  updated patient's son Dr.Mike Gervasi  previously and has been informed by GI 10/22, updated son on phone 10/24 am.  Patient status is: The patient because of severe anemia Level of care: Telemetry  Dispo: The patient is from: ALF            Anticipated disposition: ALF vs SNF over the weekend. She wants to return to ALF.  Objective: Vitals last 24 hrs: Vitals:   04/25/23 1005 04/25/23 1107 04/25/23 1110 04/25/23 1120  BP: (!) 126/42 (!) 97/28 98/62 (!) 108/36  Pulse: 79 72 73 72  Resp: (!) 22 20 17 19   Temp: 97.9 F (36.6 C) 98.5 F (36.9 C)    TempSrc: Temporal Temporal    SpO2: 95% 100% 100% 96%  Weight: 81.6 kg     Height: 5\' 1"  (1.549 m)      Weight change:   Physical Examination: General exam: alert awake, oriented .hard of hearing  HEENT:Oral mucosa moist, Ear/Nose WNL grossly Respiratory system: Bilaterally clear BS,no use of accessory muscle Cardiovascular system: S1 & S2 +, No JVD. Gastrointestinal system: Abdomen soft,NT,ND, BS+ Nervous System: Alert, awake, moving all extremities,and following commands. Extremities: LE edema neg,distal peripheral pulses palpable and warm.  Skin: No rashes,no icterus. MSK: Normal muscle bulk,tone, power   Medications reviewed:  Scheduled Meds:  [MAR Hold] Chlorhexidine Gluconate Cloth  6 each Topical Daily   [MAR Hold] dorzolamide-timolol  1 drop Both Eyes BID   [MAR Hold] influenza vaccine adjuvanted  0.5 mL Intramuscular Tomorrow-1000   [MAR Hold] latanoprost  1 drop Both Eyes QHS   [MAR Hold] melatonin  5 mg Oral QHS   [MAR Hold] oxybutynin  5 mg Oral QHS   [MAR Hold] pantoprazole  40 mg Intravenous Q12H   [MAR Hold] sertraline  75 mg Oral Daily   [MAR Hold] simvastatin  20 mg Oral QPM   [MAR Hold] umeclidinium-vilanterol  1 puff Inhalation q morning   Continuous Infusions:       Diet Order             Diet NPO time specified  Diet effective midnight                  Intake/Output Summary (Last 24 hours) at 04/25/2023 1125 Last data filed at  04/25/2023 1100 Gross per 24 hour  Intake 230 ml  Output 1660 ml  Net -1430 ml   Net IO Since Admission: -2,085.13 mL [04/25/23 1125]  Wt Readings from Last 3 Encounters:  04/25/23 81.6 kg  04/16/23 82.3 kg  04/10/23 82.3 kg     Unresulted Labs (From admission, onward)    None     Data Reviewed: I have personally reviewed following labs and imaging studies CBC: Recent Labs  Lab 04/19/23 1144 04/19/23 1227 04/21/23 0241 04/21/23 1727 04/22/23 0331 04/23/23 0319 04/24/23 0329 04/25/23 0322  WBC 7.9   < > 6.0  --  6.9 6.1 5.5 5.0  NEUTROABS 5.3  --   --   --   --   --   --   --   HGB 5.9*   < > 7.8* 8.6* 8.1* 8.9* 8.3* 7.9*  HCT 19.2*   < > 24.3* 27.7* 26.1* 28.9* 26.7* 26.5*  MCV 103.8*   < > 95.7  --  98.9 100.0 98.5 101.5*  PLT 154   < > 124*  --  134* 153 163 173   < > = values in this interval not displayed.   Basic Metabolic Panel:  Recent Labs  Lab 04/21/23 0241 04/22/23 0331 04/23/23 0319 04/24/23 0329 04/25/23 0322  NA 140 140 141 141 142  K 3.0* 3.6 3.6 3.5 3.6  CL 110 110 110 109 112*  CO2 24 24 24 23 24   GLUCOSE 120* 118* 120* 119* 108*  BUN 13 6* 6* <5* 6*  CREATININE 0.48 0.43* 0.33* 0.45 0.44  CALCIUM 7.9* 7.9* 8.5* 8.3* 8.2*   GFR: Estimated Creatinine Clearance: 50.7 mL/min (by C-G formula based on SCr of 0.44 mg/dL). Liver Function Tests: Recent Labs  Lab 04/19/23 1144 04/20/23 0739  AST 16 16  ALT 9 9  ALKPHOS 31* 27*  BILITOT 0.6 1.1  PROT 5.8* 5.0*  ALBUMIN 3.4* 3.0*  No results for input(s): "LIPASE", "AMYLASE" in the last 168 hours. No results for input(s): "AMMONIA" in the last 168 hours. Coagulation Profile: Recent Labs  Lab 04/19/23 1144  INR 1.3*  No results for input(s): "PROCALCITON", "LATICACIDVEN" in the last 168 hours.  Recent Results (from the past 240 hour(s))  MRSA Next Gen by PCR, Nasal     Status: None   Collection Time: 04/19/23  4:50 PM   Specimen: Nasal Mucosa; Nasal Swab  Result Value Ref Range  Status   MRSA by PCR Next Gen NOT DETECTED NOT DETECTED Final    Comment: (NOTE) The GeneXpert MRSA Assay (FDA approved for NASAL specimens only), is one component of a comprehensive MRSA colonization surveillance program. It is not intended to diagnose MRSA infection nor to guide or monitor treatment for MRSA infections. Test performance is not FDA approved in patients less than 29 years old. Performed at [facility], [address] [place], [place] [address]     Antimicrobials: Anti-infectives (From admission, onward)    None      Culture/Microbiology    Component Value Date/Time   SDES BLOOD RIGHT ANTECUBITAL 09/06/2017 0950   SPECREQUEST  09/06/2017 0950    BOTTLES DRAWN AEROBIC AND ANAEROBIC Blood Culture adequate volume   CULT  09/06/2017 0950    NO GROWTH 5 DAYS Performed at [facility], [address], [place], [place] [address]    REPTSTATUS 09/11/2017 FINAL 09/06/2017 5366  Radiology Studies: No results found.  LOS: 6 days  Lanae Boast, MD [facility]  04/25/2023, 11:25 AM

## 2023-04-25 NOTE — Plan of Care (Signed)

## 2023-04-25 NOTE — Plan of Care (Signed)
  Problem: Education: Goal: Knowledge of General Education information will improve Description Including pain rating scale, medication(s)/side effects and non-pharmacologic comfort measures Outcome: Progressing   

## 2023-04-25 NOTE — Transfer of Care (Signed)
Immediate Anesthesia Transfer of Care Note  Patient: Garry Heater Karge  Procedure(s) Performed: COLONOSCOPY WITH PROPOFOL BIOPSY  Patient Location: Endoscopy Unit  Anesthesia Type:MAC  Level of Consciousness: drowsy and patient cooperative  Airway & Oxygen Therapy: Patient Spontanous Breathing and Patient connected to face mask oxygen  Post-op Assessment: Report given to RN and Post -op Vital signs reviewed and stable  Post vital signs: Reviewed and stable  Last Vitals:  Vitals Value Taken Time  BP 97/28 04/25/23 1107  Temp    Pulse 91 04/25/23 1110  Resp 19 04/25/23 1110  SpO2 94 % 04/25/23 1110  Vitals shown include unfiled device data.  Last Pain:  Vitals:   04/25/23 1005  TempSrc: Temporal  PainSc: 0-No pain      Patients Stated Pain Goal: 0 (04/21/23 0900)  Complications: No notable events documented.

## 2023-04-25 NOTE — Anesthesia Postprocedure Evaluation (Signed)
Anesthesia Post Note  Patient: [name]  Procedure(s) Performed: ESOPHAGOGASTRODUODENOSCOPY (EGD) WITH PROPOFOL GIVENS CAPSULE STUDY     Patient location during evaluation: Endoscopy Anesthesia Type: MAC Level of consciousness: awake and alert Pain management: pain level controlled Vital Signs Assessment: post-procedure vital signs reviewed and stable Respiratory status: spontaneous breathing, nonlabored ventilation, respiratory function stable and patient connected to nasal cannula oxygen Cardiovascular status: stable and blood pressure returned to baseline Postop Assessment: no apparent nausea or vomiting Anesthetic complications: no   No notable events documented.               [name]

## 2023-04-25 NOTE — Op Note (Signed)
[facility] Patient Name: [name] Procedure Date: 04/25/2023 MRN: [id number] Attending MD: [name] , MD, [id number] Date of Birth: [birth year] CSN: [id number] Age: 82 Admit Type: Inpatient Procedure:                Colonoscopy Indications:               Providers:                [name], MD, Suzy Bouchard, RN, Margaree Mackintosh, RN, Rozetta Nunnery, Technician Referring MD:             hospital team Medicines:                 Complications:            No immediate complications. Estimated Blood Loss:     Estimated blood loss was minimal. Procedure:                Pre-Anesthesia Assessment:                           - Prior to the procedure, a History and Physical                            was performed, and patient medications and                            allergies were reviewed. The patient's tolerance of                            previous anesthesia was also reviewed. The risks                            and benefits of the procedure and the sedation                            options and risks were discussed with the patient.                            All questions were answered, and informed consent                            was obtained. Prior Anticoagulants: The patient has                            taken no anticoagulant or antiplatelet agents. ASA                            Grade Assessment: III - A patient with severe                            systemic disease. After reviewing the risks and  benefits, the patient was deemed in satisfactory                            condition to undergo the procedure.                           After obtaining informed consent, the colonoscope                            was passed under direct vision. Throughout the                            procedure, the patient's blood pressure, pulse, and                            oxygen saturations  were monitored continuously. The                            PCF-HQ190L ([id number]) Olympus colonoscope was                            introduced through the anus and advanced to the the                            cecum, identified by appendiceal orifice and                            ileocecal valve. The colonoscopy was performed                            without difficulty. The patient tolerated the                            procedure well. The quality of the bowel                            preparation was fair and fair but repeated                            irrigation led to a good and adequate prep. The                            ileocecal valve, appendiceal orifice, and rectum                            were photographed. Scope In: 10:39:44 AM Scope Out: 10:58:37 AM Scope Withdrawal Time: 0 hours 14 minutes 59 seconds  Total Procedure Duration: 0 hours 18 minutes 53 seconds  Findings:      The perianal and digital rectal examinations were normal.      A segmental area of moderately congested, erythematous, inflamed and       ulcerated mucosa was found in the proximal ascending colon and in the       cecum. Biopsies were taken with a cold forceps for histology. Estimated  blood loss was minimal.      A 20 mm polyp was found in the proximal ascending colon. The polyp was       semi-sessile. Biopsies were taken with a cold forceps for histology.       Estimated blood loss was minimal.      Multiple large-mouthed, medium-mouthed and small-mouthed diverticula       were found in the entire colon.      Internal hemorrhoids were found during retroflexion. The hemorrhoids       were medium-sized and Grade I (internal hemorrhoids that do not       prolapse). Impression:               - Preparation of the colon was fair.                           - Congested, erythematous, inflamed and ulcerated                            mucosa in the proximal ascending colon and in the                             cecum. Biopsied.                           - One 20 mm polyp in the proximal ascending colon.                            Biopsied.                           - Diverticulosis in the entire examined colon.                           - Internal hemorrhoids. Moderate Sedation:      N/A - MAC procedure Recommendation:           - Soft diet.                           - Await pathology results.                           - Repeat colonoscopy for surveillance based on                            pathology results. Procedure Code(s):        --- Professional ---                           [phone], Colonoscopy, flexible; with biopsy, single                            or multiple Diagnosis Code(s):        --- Professional ---                           K63.3, Ulcer of intestine  K52.9, Noninfective gastroenteritis and colitis,                            unspecified                           K63.89, Other specified diseases of intestine                           D12.2, Benign neoplasm of ascending colon                           K64.0, First degree hemorrhoids                           K57.30, Diverticulosis of large intestine without                            perforation or abscess without bleeding CPT copyright 2022 American [employer] Association. All rights reserved. The codes documented in this report are preliminary and upon coder review may  be revised to meet current compliance requirements. [name], MD 04/25/2023 11:06:54 AM This report has been signed electronically. Number of Addenda: 0

## 2023-04-25 NOTE — Anesthesia Postprocedure Evaluation (Signed)
Anesthesia Post Note  Patient: [name]  Procedure(s) Performed: COLONOSCOPY WITH PROPOFOL BIOPSY     Patient location during evaluation: Phase II Anesthesia Type: MAC Level of consciousness: awake and alert, patient cooperative and oriented Pain management: pain level controlled Vital Signs Assessment: post-procedure vital signs reviewed and stable Respiratory status: nonlabored ventilation, spontaneous breathing and respiratory function stable Cardiovascular status: blood pressure returned to baseline and stable Postop Assessment: no apparent nausea or vomiting Anesthetic complications: no   No notable events documented.  Last Vitals:  Vitals:   04/25/23 1120 04/25/23 1130  BP: (!) 108/36 (!) 112/43  Pulse: 72 71  Resp: 19 19  Temp:    SpO2: 96% 94%    Last Pain:  Vitals:   04/25/23 1130  TempSrc:   PainSc: 0-No pain                 Zeola Brys,E. Morgan Keinath

## 2023-04-25 NOTE — Anesthesia Preprocedure Evaluation (Addendum)
Anesthesia Evaluation  Patient identified by MRN, date of birth, ID band Patient awake    Reviewed: Allergy & Precautions, NPO status , Patient's Chart, lab work & pertinent test results, reviewed documented beta blocker date and time   History of Anesthesia Complications Negative for: history of anesthetic complications  Airway Mallampati: II  TM Distance: >3 FB Neck ROM: Full    Dental  (+) Missing, Dental Advisory Given, Poor Dentition   Pulmonary COPD,  COPD inhaler, former smoker   breath sounds clear to auscultation       Cardiovascular hypertension, Pt. on medications and Pt. on home beta blockers (-) angina  Rhythm:Regular Rate:Normal     Neuro/Psych  Headaches Vertigo glaucoma    GI/Hepatic Neg liver ROS,GERD  Medicated and Controlled,,  Endo/Other  BMI 34  Renal/GU negative Renal ROS     Musculoskeletal  (+) Arthritis ,    Abdominal   Peds  Hematology Hb 7.9, plt 173k   Anesthesia Other Findings   Reproductive/Obstetrics                              Anesthesia Physical Anesthesia Plan  ASA: 3  Anesthesia Plan: MAC   Post-op Pain Management: Minimal or no pain anticipated   Induction:   PONV Risk Score and Plan: 2 and Treatment may vary due to age or medical condition  Airway Management Planned: Natural Airway and Simple Face Mask  Additional Equipment: None  Intra-op Plan:   Post-operative Plan:   Informed Consent: I have reviewed the patients History and Physical, chart, labs and discussed the procedure including the risks, benefits and alternatives for the proposed anesthesia with the patient or authorized representative who has indicated his/her understanding and acceptance.   Patient has DNR.  Discussed DNR with patient and Suspend DNR.   Dental advisory given  Plan Discussed with: CRNA and Surgeon  Anesthesia Plan Comments:         Anesthesia  Quick Evaluation

## 2023-04-26 DIAGNOSIS — K922 Gastrointestinal hemorrhage, unspecified: Secondary | ICD-10-CM | POA: Diagnosis not present

## 2023-04-26 LAB — CBC WITH DIFFERENTIAL/PLATELET
Abs Immature Granulocytes: 0.01 10*3/uL (ref 0.00–0.07)
Basophils Absolute: 0 10*3/uL (ref 0.0–0.1)
Basophils Relative: 0 %
Eosinophils Absolute: 0.1 10*3/uL (ref 0.0–0.5)
Eosinophils Relative: 2 %
HCT: 28.8 % — ABNORMAL LOW (ref 36.0–46.0)
Hemoglobin: 8.8 g/dL — ABNORMAL LOW (ref 12.0–15.0)
Immature Granulocytes: 0 %
Lymphocytes Relative: 23 %
Lymphs Abs: 1.3 10*3/uL (ref 0.7–4.0)
MCH: 30.3 pg (ref 26.0–34.0)
MCHC: 30.6 g/dL (ref 30.0–36.0)
MCV: 99.3 fL (ref 80.0–100.0)
Monocytes Absolute: 0.6 10*3/uL (ref 0.1–1.0)
Monocytes Relative: 10 %
Neutro Abs: 3.7 10*3/uL (ref 1.7–7.7)
Neutrophils Relative %: 65 %
Platelets: 196 10*3/uL (ref 150–400)
RBC: 2.9 MIL/uL — ABNORMAL LOW (ref 3.87–5.11)
RDW: 16.4 % — ABNORMAL HIGH (ref 11.5–15.5)
WBC: 5.7 10*3/uL (ref 4.0–10.5)
nRBC: 0 % (ref 0.0–0.2)

## 2023-04-26 NOTE — Progress Notes (Signed)
Physical Therapy Treatment Patient Details Name: [name] MRN: [id number] DOB: 06-06-1941 Today's Date: 04/26/2023   History of Present Illness Patient is a 82 year old female who presented to the hospital with low hgb. Patient was admitted with UGI bleed.  [name], depression, chronic diastolic heart failure, osteoarthritis, glaucoma, hyperlipidemia, hypertension, pulmonary nodule, restless leg syndrome, osteoporosis, COPD, aortic arthrosclerosis, GERD, history of diverticular bleed with embolization, UGI bleed, chronic iron deficiency anemia    PT Comments  Pt ambulated in hallway and reports feeling better.  Pt uncertain of d/c plan at this time.  Pt lives at assisted living and uncertain if they can assist her as needed.  Pt finally able to ambulate today and starting to progress.  Will defer PT needs upon d/c to facility.  Pt may need skilled rehab section if assisted living does not feel able for pt to return.     If plan is discharge home, recommend the following: A little help with walking and/or transfers;A little help with bathing/dressing/bathroom;Assistance with cooking/housework;Assist for transportation;Help with stairs or ramp for entrance   Can travel by private vehicle        Equipment Recommendations  None recommended by PT    Recommendations for Other Services       Precautions / Restrictions Precautions Precautions: Fall Precaution Comments: incontinent     Mobility  Bed Mobility               General bed mobility comments: pt in recliner    Transfers Overall transfer level: Needs assistance Equipment used: Rolling walker (2 wheels) Transfers: Sit to/from Stand Sit to Stand: Contact guard assist           General transfer comment: cues for use of armrests    Ambulation/Gait Ambulation/Gait assistance: Contact guard assist Gait Distance (Feet): 120 Feet Assistive device: Rolling walker (2 wheels) Gait Pattern/deviations:  Step-through pattern, Decreased stride length, Trunk flexed       General Gait Details: verbal cues for RW positioning, slow but steady pace, distance to tolerance   Stairs             Wheelchair Mobility     Tilt Bed    Modified Rankin (Stroke Patients Only)       Balance           Standing balance support: No upper extremity supported Standing balance-Leahy Scale: Fair Standing balance comment: static fair                            Cognition Arousal: Alert Behavior During Therapy: WFL for tasks assessed/performed Overall Cognitive Status: Within Functional Limits for tasks assessed                                 General Comments: HOH        Exercises      General Comments        Pertinent Vitals/Pain Pain Assessment Pain Assessment: No/denies pain    Home Living                          Prior Function            PT Goals (current goals can now be found in the care plan section) Progress towards PT goals: Progressing toward goals    Frequency    Min 1X/week  PT Plan      Co-evaluation              AM-PAC PT "6 Clicks" Mobility   Outcome Measure  Help needed turning from your back to your side while in a flat bed without using bedrails?: A Little Help needed moving from lying on your back to sitting on the side of a flat bed without using bedrails?: A Little Help needed moving to and from a bed to a chair (including a wheelchair)?: A Little Help needed standing up from a chair using your arms (e.g., wheelchair or bedside chair)?: A Little Help needed to walk in hospital room?: A Little Help needed climbing 3-5 steps with a railing? : A Lot 6 Click Score: 17    End of Session Equipment Utilized During Treatment: Gait belt Activity Tolerance: Patient tolerated treatment well Patient left: with call bell/phone within reach;in chair Nurse Communication: Mobility status PT Visit  Diagnosis: Difficulty in walking, not elsewhere classified (R26.2);Muscle weakness (generalized) (M62.81)     Time: 4540-9811 PT Time Calculation (min) (ACUTE ONLY): 20 min  Charges:    $Gait Training: 8-22 mins PT General Charges $$ ACUTE PT VISIT: 1 Visit                     Paulino Door, DPT Physical Therapist Acute Rehabilitation Services Office: [phone]    [name] 04/26/2023, 4:36 PM

## 2023-04-26 NOTE — Plan of Care (Addendum)
Discussed with patient plan of care for the evening, pain management and bedtime medications with some teach back displayed.  What is important to the patient is going to the bathroom to void prior to bed and reattaching her pure wick at bed time.  Also she needs some denture tablets.  Patient was ambulated from the chair to the bed by the NT  Problem: Education: Goal: Knowledge of General Education information will improve Description: Including pain rating scale, medication(s)/side effects and non-pharmacologic comfort measures Outcome: Progressing   Problem: Health Behavior/Discharge Planning: Goal: Ability to manage health-related needs will improve Outcome: Progressing

## 2023-04-26 NOTE — Plan of Care (Signed)
  Problem: Education: Goal: Ability to identify signs and symptoms of gastrointestinal bleeding will improve Outcome: Progressing   Problem: Bowel/Gastric: Goal: Will show no signs and symptoms of gastrointestinal bleeding Outcome: Progressing

## 2023-04-26 NOTE — Plan of Care (Signed)
  Problem: Education: Goal: Knowledge of General Education information will improve Description: Including pain rating scale, medication(s)/side effects and non-pharmacologic comfort measures Outcome: Progressing   Problem: Health Behavior/Discharge Planning: Goal: Ability to manage health-related needs will improve Outcome: Progressing   Problem: Clinical Measurements: Goal: Ability to maintain clinical measurements within normal limits will improve Outcome: Progressing Goal: Will remain free from infection Outcome: Progressing Goal: Diagnostic test results will improve Outcome: Progressing Goal: Respiratory complications will improve Outcome: Progressing Goal: Cardiovascular complication will be avoided Outcome: Progressing   Problem: Activity: Goal: Risk for activity intolerance will decrease Outcome: Progressing   Problem: Nutrition: Goal: Adequate nutrition will be maintained Outcome: Progressing   Problem: Coping: Goal: Level of anxiety will decrease Outcome: Progressing   Problem: Coping: Goal: Level of anxiety will decrease Outcome: Progressing   

## 2023-04-26 NOTE — Progress Notes (Signed)
[facility] Progress Note  SUBJECTIVE:   Interval history: [name] was seen and evaluated today at bedside. Resting comfortably in bed. Denied abdominal pain. No nausea or vomiting, tolerated breakfast this AM. Unsure when her last BM was. No chest pain. Noted having chronic shortness of breath from COPD. Lower extremity pain to palpation.  Past Medical History:  Diagnosis Date   Anxiety    Arthritis    "bilateral knees, shoulders, elbows; neck, pretty widespread" (09/05/2017)   BPPV (benign paroxysmal positional vertigo)    Depression    GERD (gastroesophageal reflux disease)    Glaucoma, both eyes    Headache    "probably 2/month" (09/05/2017)   History of blood transfusion ~ 2008   "related to LGIB"   Hyperlipemia    Lower GI bleeding ~ 2008; 09/05/2017   "had to have blood transfusion"   Macular degeneration, bilateral    Osteopenia    Seasonal allergies    Skin cancer, basal cell 2001   "off my nose, left side"   Sleeping excessive    Tinnitus of both ears    Past Surgical History:  Procedure Laterality Date   BALLOON DILATION N/A 05/08/2021   Procedure: BALLOON DILATION;  Surgeon: Kerin Salen, MD;  Location: [facility] ENDOSCOPY;  Service: Gastroenterology;  Laterality: N/A;   BASAL CELL CARCINOMA EXCISION  2001   "off my nose, left side"   BIOPSY  05/08/2021   Procedure: BIOPSY;  Surgeon: Kerin Salen, MD;  Location: [facility] ENDOSCOPY;  Service: Gastroenterology;;   BLEPHAROPLASTY Bilateral    CATARACT EXTRACTION W/ INTRAOCULAR LENS  IMPLANT, BILATERAL Bilateral 1990's   COLONOSCOPY WITH PROPOFOL N/A 05/04/2022   Procedure: COLONOSCOPY WITH PROPOFOL;  Surgeon: Kerin Salen, MD;  Location: [facility] ENDOSCOPY;  Service: Gastroenterology;  Laterality: N/A;   ESOPHAGOGASTRODUODENOSCOPY (EGD) WITH PROPOFOL N/A 05/08/2021   Procedure: ESOPHAGOGASTRODUODENOSCOPY (EGD) WITH PROPOFOL;  Surgeon: Kerin Salen, MD;  Location: [facility] ENDOSCOPY;  Service: Gastroenterology;  Laterality: N/A;    ESOPHAGOGASTRODUODENOSCOPY (EGD) WITH PROPOFOL N/A 01/11/2022   Procedure: ESOPHAGOGASTRODUODENOSCOPY (EGD) WITH PROPOFOL;  Surgeon: Vida Rigger, MD;  Location: [facility] ENDOSCOPY;  Service: Gastroenterology;  Laterality: N/A;   ESOPHAGOGASTRODUODENOSCOPY (EGD) WITH PROPOFOL N/A 01/06/2023   Procedure: ESOPHAGOGASTRODUODENOSCOPY (EGD) WITH PROPOFOL;  Surgeon: Vida Rigger, MD;  Location: [facility] ENDOSCOPY;  Service: Gastroenterology;  Laterality: N/A;   ESOPHAGOGASTRODUODENOSCOPY (EGD) WITH PROPOFOL N/A 04/21/2023   Procedure: ESOPHAGOGASTRODUODENOSCOPY (EGD) WITH PROPOFOL;  Surgeon: Charlott Rakes, MD;  Location: [facility] ENDOSCOPY;  Service: Gastroenterology;  Laterality: N/A;   EYE SURGERY Bilateral    "to improve vision after cataract OR"   GIVENS CAPSULE STUDY N/A 04/21/2023   Procedure: GIVENS CAPSULE STUDY;  Surgeon: Charlott Rakes, MD;  Location: [facility] ENDOSCOPY;  Service: Gastroenterology;  Laterality: N/A;   IR ANGIOGRAM FOLLOW UP STUDY  12/19/2020   IR ANGIOGRAM SELECTIVE EACH ADDITIONAL VESSEL  12/19/2020   IR ANGIOGRAM SELECTIVE EACH ADDITIONAL VESSEL  12/19/2020   IR ANGIOGRAM VISCERAL SELECTIVE  12/19/2020   IR EMBO ART  VEN HEMORR LYMPH EXTRAV  INC GUIDE ROADMAPPING  12/19/2020   IR US GUIDE VASC ACCESS RIGHT  12/19/2020   JOINT REPLACEMENT     POLYPECTOMY  05/04/2022   Procedure: POLYPECTOMY;  Surgeon: Kerin Salen, MD;  Location: [facility] ENDOSCOPY;  Service: Gastroenterology;;   STAPEDES SURGERY Left    "scraped stapedes because it was sticking when it wasn't suppose to"   TONSILLECTOMY AND ADENOIDECTOMY  1946   TOTAL KNEE ARTHROPLASTY Left ~ 2008   TOTAL KNEE ARTHROPLASTY  12/20/2011  Procedure: TOTAL KNEE ARTHROPLASTY;  Surgeon: Verlee Rossetti, MD;  Location[facility];  Service: Orthopedics;  Laterality: Right;  Right Total Knee Arthroplasty   TUBAL LIGATION  1980's   Current Facility-Administered Medications  Medication Dose Route Frequency Provider Last Rate Last Admin   acetaminophen (TYLENOL)  tablet 650 mg  650 mg Oral Q6H PRN Charlott Rakes, MD   650 mg at 04/24/23 2123   Or   acetaminophen (TYLENOL) suppository 650 mg  650 mg Rectal Q6H PRN Charlott Rakes, MD       Chlorhexidine Gluconate Cloth 2 % PADS 6 each  6 each Topical Daily Charlott Rakes, MD   6 each at 04/24/23 1017   dorzolamide-timolol (COSOPT) 2-0.5 % ophthalmic solution 1 drop  1 drop Both Eyes BID Charlott Rakes, MD   1 drop at 04/25/23 2205   guaiFENesin (ROBITUSSIN) 100 MG/5ML liquid 10 mL  10 mL Oral Q4H PRN Charlott Rakes, MD       influenza vaccine adjuvanted (FLUAD) injection 0.5 mL  0.5 mL Intramuscular Tomorrow-1000 Charlott Rakes, MD       latanoprost (XALATAN) 0.005 % ophthalmic solution 1 drop  1 drop Both Eyes QHS Charlott Rakes, MD   1 drop at 04/25/23 2205   melatonin tablet 5 mg  5 mg Oral QHS Charlott Rakes, MD   5 mg at 04/25/23 2211   Oral care mouth rinse  15 mL Mouth Rinse PRN Charlott Rakes, MD       oxybutynin (DITROPAN-XL) 24 hr tablet 5 mg  5 mg Oral QHS Charlott Rakes, MD   5 mg at 04/25/23 2211   pantoprazole (PROTONIX) injection 40 mg  40 mg Intravenous Vira Browns, MD   40 mg at 04/25/23 2211   prochlorperazine (COMPAZINE) injection 5 mg  5 mg Intravenous Q4H PRN Charlott Rakes, MD       sertraline (ZOLOFT) tablet 75 mg  75 mg Oral Daily Charlott Rakes, MD   75 mg at 04/25/23 1200   simvastatin (ZOCOR) tablet 20 mg  20 mg Oral QPM Charlott Rakes, MD   20 mg at 04/25/23 1818   umeclidinium-vilanterol (ANORO ELLIPTA) 62.5-25 MCG/ACT 1 puff  1 puff Inhalation q morning Charlott Rakes, MD   1 puff at 04/26/23 0921   Allergies as of 04/19/2023 - Review Complete 04/19/2023  Allergen Reaction Noted   Morphine and codeine Itching 11/29/2011   Aspirin Other (See Comments) 10/22/2020   Duloxetine hcl Other (See Comments) 10/22/2020   Tymlos [abaloparatide] Other (See Comments) 10/22/2020   Ventolin [albuterol] Other (See Comments) 12/25/2020    Cefadroxil Hives 12/11/2011   Review of Systems:  Review of Systems  Respiratory:  Positive for shortness of breath (chronic).   Cardiovascular:  Negative for chest pain.  Gastrointestinal:  Negative for abdominal pain, nausea and vomiting.    OBJECTIVE:   Temp:  [97.9 F (36.6 C)-98.6 F (37 C)] 98.6 F (37 C) (10/26 0915) Pulse Rate:  [66-91] 66 (10/26 0915) Resp:  [14-22] 20 (10/26 0915) BP: (97-138)/(28-101) 114/56 (10/26 0915) SpO2:  [94 %-100 %] 94 % (10/26 0915) Weight:  [81.6 kg] 81.6 kg (10/25 1005) Last BM Date : 04/25/23 Physical Exam Constitutional:      General: She is not in acute distress.    Appearance: She is not ill-appearing, toxic-appearing or diaphoretic.  Cardiovascular:     Rate and Rhythm: Normal rate and regular rhythm.  Pulmonary:     Effort: No respiratory distress.     Breath sounds: Normal  breath sounds.  Abdominal:     General: Bowel sounds are normal. There is no distension.     Palpations: Abdomen is soft.     Tenderness: There is no abdominal tenderness. There is no guarding.  Musculoskeletal:     Right lower leg: No edema.     Left lower leg: No edema.  Skin:    General: Skin is warm.  Neurological:     Mental Status: She is alert.     Labs: Recent Labs    04/24/23 0329 04/25/23 0322  WBC 5.5 5.0  HGB 8.3* 7.9*  HCT 26.7* 26.5*  PLT 163 173   BMET Recent Labs    04/24/23 0329 04/25/23 0322  NA 141 142  K 3.5 3.6  CL 109 112*  CO2 23 24  GLUCOSE 119* 108*  BUN <5* 6*  CREATININE 0.45 0.44  CALCIUM 8.3* 8.2*   LFT No results for input(s): "PROT", "ALBUMIN", "AST", "ALT", "ALKPHOS", "BILITOT", "BILIDIR", "IBILI" in the last 72 hours. PT/INR No results for input(s): "LABPROT", "INR" in the last 72 hours. Diagnostic imaging: No results found.  IMPRESSION: Hematochezia  -Colonoscopy 04/25/2023 showed area of suspected ischemic colitis in proximal ascending colon/cecum, proximal ascending colon polyp status post  biopsy, pan-colonic diverticulosis Normocytic anemia, stable  GERD Hyperlipidemia  PLAN: -Does not appear to have further lower GI bleeding at this time, no current abdominal pain, tolerating diet  -If patient has abdominal pain or recurrent GI bleeding, would recommend CT angiogram of abdomen and pelvis given limited right sided ischemic colitis findings on colonoscopy  -Follow up pathology specimens -Trend H/H, transfuse for Hgb < 7  -[facility] will follow peripherally for pathology results, recommend outpatient follow up with [facility] for discussion regarding definitive polyp management    LOS: 7 days   Liliane Shi, [facility]

## 2023-04-26 NOTE — Progress Notes (Signed)
PROGRESS NOTE [name]  QMV:[id number] DOB: [birth year] DOA: 04/19/2023 PCP: Octavia Heir, NP  Brief Narrative/Hospital Course: 82 y.o.f w/ anxiety,depression, chronic diastolic heart failure, osteoarthritis, glaucoma, hyperlipidemia, hypertension, pulmonary nodule, restless leg syndrome, osteoporosis, COPD, aortic arthrosclerosis, GERD, history of diverticular bleed with embolization, UGI bleed, chronic iron deficiency anemia due to chronic blood loss with multiple admissions due to GI bleed most recently seen from 03/27/2023 until 03/29/2023 with recent EGD in August 2024 that showed gastritis who presented to the ED from facility due to low hemoglobin level associated with several melena episodes.  In MW:UXLKGM Temp-98.1 F,HR-78,RR 16, BP 111/48 mmHg O2 sat 98% on RA.The patient received 500 mL normal saline bolus.Labs:WBC-7.9,HB-5.9 g/dL with an MCV of 010.2 fL and platelets 554.  PT 16.2 and INR 1.3.FOBT positive.CMP showed normal electrolytes after calcium correction, glucose 128, BUN 24 and creatinine 0.49 mg/deciliter.  AST 31 units/L.  Total protein was 5.8 and albumin 3.4 g/dL.The rest of the LFTs were unremarkable. CTA GI bleed>no evidence of active GI bleed by CT,Cholelithiasis without cholecystitis.  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  Aortic atherosclerosis. 2u PRBC ordered and admitted. Seen by GI. EGD done 10/21> Acute gastritis,multiple gastric polyps.Normal examined duodenum.  CamPill placed and monitored in SDU and completed 10/22 Her hemoglobin has been stable above 8. Capsule endoscopy 10/22> focal area of red blood in proximal colon GI planning to repeat colonoscopy with 2-day prep on 04/24/23.  PT OT had seen the patient and advised skilled nursing facility upon discharge The patient has been seen by GI today. They area of inflammation in the colon is of concern for ischemic colitis. Should the patient begin bleeding again or to have  pain GI recommends CTA of the abdomen and pelvis.  Subjective: Patient seen and examined this morning  Alert awake oriented resting comfortably hard of hearing  Still drinking her colon prep  bowel not clear yet Hemoglobin stable PT/OT has recommended ALF/SNF.   Assessment and Plan: Principal Problem:   UGI bleed Active Problems:   Depression with anxiety   Hyperlipidemia   Glaucoma   COPD (chronic obstructive pulmonary disease) (HCC)   Iron deficiency anemia   Hypertension   Pulmonary nodule 1 cm or greater in diameter   GERD (gastroesophageal reflux disease)   Class 1 obesity with body mass index (BMI) of 34.0 to 34.9 in adult   Thoracic aorta atherosclerosis (HCC)   Severe anemia  chronic anemia due to GI blood loss Melena  GERD History of upper GI bleed: Recent admission in September for GI bleeding had EGD in August showing gastritis.S/p 2 units PRBC so far. Appreciate GI input. EGD done 10/21>Acute gastritis,multiple gastric polyps.Normal examined duodenum. CamPill placed and monitored in SDU>Capsule endoscopy 10/22>focal area of red blood in proximal colon s/p colonoscopy after 2 day bowel prep 10/25>mild to mod proximal colonic ischemic looking mucosa - biopsies taken.  Per GI okay for diet. GI will continue to follow peripherally for biopsy results. Continue PPI, monitor hemoglobin. Hemoglobin today has actually increased from 7.9 to 8.8. Transfuse for hemoglobin below 7.0. She is on a soft diet.  Hypokalemia: Resolved.  Thrombocytopenia: Likely from acute illness.  Resolved.     Anxiety/depression: Mood is stable, Cont on sertraline.  HLD Thoracic aortic atherosclerosis: Stable, continue home statin.  HTN: BP is fairly controlled.  Holding metoprolol for now   COPD: Not in exacerbation, CONT Nebs pnr. The patient is saturating in the mid-high nineties on room air.  Pulmonary nodule > 1 cm: Need follow-up with PCP/pulmonary as OP if she desires  Obesity  with BMI of 34: Will benefit with weight loss  Deconditioning/debility Resident of ALF: PT OT evaluation requested for disposition> advised skilled nursing facility  I have seen and examined this patient myself. I have spent 32 minutes in her evaluation and care.  DVT prophylaxis: SCDs Start: 04/19/23 1443 Code Status:   Code Status: Do not attempt resuscitation (DNR) PRE-ARREST INTERVENTIONS DESIRED Family Communication: plan of care discussed with patient/no family at bedside.  Patient status is: The patient because of severe anemia Level of care: Telemetry  Dispo: The patient is from: ALF            Anticipated disposition: ALF vs SNF over the weekend. She wants to return to ALF.  Objective: Vitals last 24 hrs: Vitals:   04/25/23 2114 04/26/23 0438 04/26/23 0915 04/26/23 1241  BP:  112/60 (!) 114/56 (!) 114/51  Pulse: 80 91 66 65  Resp: 18 18 20 17   Temp: 98.1 F (36.7 C) 98.2 F (36.8 C) 98.6 F (37 C) 98 F (36.7 C)  TempSrc: Oral Oral Oral Oral  SpO2: 94% 95% 94% 97%  Weight:      Height:       Weight change:   Physical Examination: Exam:  Constitutional:  The patient is awake, alert, and oriented x 3. No acute distress. Respiratory:  No increased work of breathing. No wheezes, rales, or rhonchi No tactile fremitus Cardiovascular:  Regular rate and rhythm No murmurs, ectopy, or gallups. No lateral PMI. No thrills. Abdomen:  Abdomen is soft, non-tender, non-distended No hernias, masses, or organomegaly Normoactive bowel sounds.  Musculoskeletal:  No cyanosis, clubbing, or edema Skin:  No rashes, lesions, ulcers palpation of skin: no induration or nodules Neurologic:  CN 2-12 intact Sensation all 4 extremities intact Psychiatric:  Mental status Mood, affect appropriate Orientation to person, place, time  judgment and insight appear intact   Medications reviewed:  Scheduled Meds:  Chlorhexidine Gluconate Cloth  6 each Topical Daily    dorzolamide-timolol  1 drop Both Eyes BID   influenza vaccine adjuvanted  0.5 mL Intramuscular Tomorrow-1000   latanoprost  1 drop Both Eyes QHS   melatonin  5 mg Oral QHS   oxybutynin  5 mg Oral QHS   pantoprazole  40 mg Intravenous Q12H   sertraline  75 mg Oral Daily   simvastatin  20 mg Oral QPM   umeclidinium-vilanterol  1 puff Inhalation q morning       Diet Order             DIET SOFT Fluid consistency: Thin  Diet effective 1400                  Intake/Output Summary (Last 24 hours) at 04/26/2023 1753 Last data filed at 04/26/2023 1420 Gross per 24 hour  Intake 120 ml  Output 300 ml  Net -180 ml   Net IO Since Admission: -2,765.13 mL [04/26/23 1753]  Wt Readings from Last 3 Encounters:  04/25/23 81.6 kg  04/16/23 82.3 kg  04/10/23 82.3 kg     Unresulted Labs (From admission, onward)     Start     Ordered   Signed and Held  Lactic acid, plasma  (Lactic Acid)  STAT Now then every 3 hours,   R     Question:  Specimen collection method  Answer:  Lab=Lab collect   Signed and Held  Data Reviewed: I have personally reviewed following labs and imaging studies CBC: Recent Labs  Lab 04/22/23 0331 04/23/23 0319 04/24/23 0329 04/25/23 0322 04/26/23 1025  WBC 6.9 6.1 5.5 5.0 5.7  NEUTROABS  --   --   --   --  3.7  HGB 8.1* 8.9* 8.3* 7.9* 8.8*  HCT 26.1* 28.9* 26.7* 26.5* 28.8*  MCV 98.9 100.0 98.5 101.5* 99.3  PLT 134* 153 163 173 196   Basic Metabolic Panel: Recent Labs  Lab 04/21/23 0241 04/22/23 0331 04/23/23 0319 04/24/23 0329 04/25/23 0322  NA 140 140 141 141 142  K 3.0* 3.6 3.6 3.5 3.6  CL 110 110 110 109 112*  CO2 24 24 24 23 24   GLUCOSE 120* 118* 120* 119* 108*  BUN 13 6* 6* <5* 6*  CREATININE 0.48 0.43* 0.33* 0.45 0.44  CALCIUM 7.9* 7.9* 8.5* 8.3* 8.2*   GFR: Estimated Creatinine Clearance: 50.7 mL/min (by C-G formula based on SCr of 0.44 mg/dL). Liver Function Tests: Recent Labs  Lab 04/20/23 0739  AST 16  ALT 9   ALKPHOS 27*  BILITOT 1.1  PROT 5.0*  ALBUMIN 3.0*  No results for input(s): "LIPASE", "AMYLASE" in the last 168 hours. No results for input(s): "AMMONIA" in the last 168 hours. Coagulation Profile: No results for input(s): "INR", "PROTIME" in the last 168 hours. No results for input(s): "PROCALCITON", "LATICACIDVEN" in the last 168 hours.  Recent Results (from the past 240 hour(s))  MRSA Next Gen by PCR, Nasal     Status: None   Collection Time: 04/19/23  4:50 PM   Specimen: Nasal Mucosa; Nasal Swab  Result Value Ref Range Status   MRSA by PCR Next Gen NOT DETECTED NOT DETECTED Final    Comment: (NOTE) The GeneXpert MRSA Assay (FDA approved for NASAL specimens only), is one component of a comprehensive MRSA colonization surveillance program. It is not intended to diagnose MRSA infection nor to guide or monitor treatment for MRSA infections. Test performance is not FDA approved in patients less than 46 years old. Performed at [facility], [address] [place], [place] [address]     Antimicrobials: Anti-infectives (From admission, onward)    None      Culture/Microbiology    Component Value Date/Time   SDES BLOOD RIGHT ANTECUBITAL 09/06/2017 0950   SPECREQUEST  09/06/2017 0950    BOTTLES DRAWN AEROBIC AND ANAEROBIC Blood Culture adequate volume   CULT  09/06/2017 0950    NO GROWTH 5 DAYS Performed at [facility], [address], [place], [place] [address]    REPTSTATUS 09/11/2017 FINAL 09/06/2017 3875  Radiology Studies: No results found.  LOS: 7 days  Fran Lowes, MD [facility]  04/26/2023, 5:53 PM

## 2023-04-27 DIAGNOSIS — J410 Simple chronic bronchitis: Secondary | ICD-10-CM | POA: Diagnosis not present

## 2023-04-27 DIAGNOSIS — E66811 Obesity, class 1: Secondary | ICD-10-CM | POA: Diagnosis not present

## 2023-04-27 DIAGNOSIS — K922 Gastrointestinal hemorrhage, unspecified: Secondary | ICD-10-CM | POA: Diagnosis not present

## 2023-04-27 DIAGNOSIS — K2100 Gastro-esophageal reflux disease with esophagitis, without bleeding: Secondary | ICD-10-CM

## 2023-04-27 DIAGNOSIS — E6609 Other obesity due to excess calories: Secondary | ICD-10-CM

## 2023-04-27 DIAGNOSIS — Z6834 Body mass index (BMI) 34.0-34.9, adult: Secondary | ICD-10-CM

## 2023-04-27 LAB — BASIC METABOLIC PANEL
Anion gap: 7 (ref 5–15)
BUN: 10 mg/dL (ref 8–23)
CO2: 27 mmol/L (ref 22–32)
Calcium: 8.6 mg/dL — ABNORMAL LOW (ref 8.9–10.3)
Chloride: 106 mmol/L (ref 98–111)
Creatinine, Ser: 0.48 mg/dL (ref 0.44–1.00)
GFR, Estimated: >60 mL/min (ref >60)
Glucose, Bld: 120 mg/dL — ABNORMAL HIGH (ref 70–99)
Potassium: 3.8 mmol/L (ref 3.5–5.1)
Sodium: 140 mmol/L (ref 135–145)

## 2023-04-27 LAB — CBC
HCT: 27.2 % — ABNORMAL LOW (ref 36.0–46.0)
Hemoglobin: 8.4 g/dL — ABNORMAL LOW (ref 12.0–15.0)
MCH: 30.4 pg (ref 26.0–34.0)
MCHC: 30.9 g/dL (ref 30.0–36.0)
MCV: 98.6 fL (ref 80.0–100.0)
Platelets: 197 10*3/uL (ref 150–400)
RBC: 2.76 MIL/uL — ABNORMAL LOW (ref 3.87–5.11)
RDW: 15.9 % — ABNORMAL HIGH (ref 11.5–15.5)
WBC: 5.5 10*3/uL (ref 4.0–10.5)
nRBC: 0.4 % — ABNORMAL HIGH (ref 0.0–0.2)

## 2023-04-27 MED ORDER — PANTOPRAZOLE SODIUM 40 MG PO TBEC
40.0000 mg | DELAYED_RELEASE_TABLET | Freq: Two times a day (BID) | ORAL | Status: DC
  Administered 2023-04-27 – 2023-04-29 (×5): 40 mg via ORAL
  Filled 2023-04-27 (×5): qty 1

## 2023-04-27 NOTE — Progress Notes (Signed)
[facility]                                                                              [name], is a 82 y.o. female, DOB - 06-Jun-1941, ZOX:[id number] Admit date - 04/19/2023    Outpatient Primary MD for the patient is [name], [name], NP  LOS - 8  days  No chief complaint on file.      Brief summary   Patient is a 82 year old female with anxiety,depression, chronic diastolic CHF, osteoarthritis, glaucoma, HLP, HTN, pulmonary nodule, restless leg syndrome, osteoporosis, COPD, aortic arthrosclerosis, GERD, history of diverticular bleed with embolization, UGI bleed, chronic iron deficiency anemia due to chronic blood loss with multiple admissions due to GI bleed most recently seen from 03/27/2023 until 03/29/2023 with recent EGD in August 2024 that showed gastritis presented to ED with anemia associated with several episodes of melenotic stools. In ED, vital signs stable, hemoglobin 5.9 with MCV 103.8, FOBT positive CTA for GI bleed showed no active GI bleeding, cholelithiasis without cholecystitis, chronic L1 vertebral body compression fracture. Patient was admitted for further workup.   Assessment & Plan    Principal Problem: Acute UGI bleed Acute on chronic anemia, macrocytic.  Chronic anemia due to GI blood loss -Recent admission in 03/2023 for GI bleeding. EGD in August showing gastritis. -Received 2 units packed RBCs, GI was consulted  -Underwent EGD 10/21 showed acute gastritis, multiple gastric polyps. Normal examined duodenum.  -Capsule endoscopy 10/22 showed focal area of red blood in proximal colon -s/p colonoscopy with a 2-day bowel prep 10/25 showed mild to mod proximal colonic ischemic looking mucosa - biopsies taken.  -GI will follow for biopsy results.  -Continue PPI, H&H stable, 8.4, no further bleeding.  -Transfuse for hemoglobin less than 7, continue soft diet  Active problems Hypokalemia: Resolved.   Thrombocytopenia: Likely from acute  illness.  Resolved.     Anxiety/depression: -Stable, continue sertraline   Type bulimia Thoracic aortic atherosclerosis: Stable, continue home statin.   HTN: BP is soft, continue to hold metoprolol    COPD: -No wheezing, continue Anoro Ellipta   Pulmonary nodule > 1 cm: Need follow-up with PCP/pulmonary as OP if she desires   Generalized deconditioning/debility Resident of ALF: PT OT evaluation requested for disposition> advised skilled nursing facility    Class 1 obesity with body mass index (BMI) of 34.0 to 34.9 in adult Estimated body mass index is 33.99 kg/m as calculated from the following:   Height as of this encounter: 5\' 1"  (1.549 m).   Weight as of this encounter: 81.6 kg.  Code Status: DNR DVT Prophylaxis:  SCDs Start: 04/19/23 1443   Level of Care: Level of care: Telemetry Family Communication: Updated patient Disposition Plan:      Remains inpatient appropriate: Needs SNF   Procedures:  EGD, capsule endoscopy, colonoscopy  Consultants:   Gastroenterology  Antimicrobials:   Anti-infectives (From admission, onward)    None          Medications  Chlorhexidine Gluconate Cloth  6 each Topical Daily   dorzolamide-timolol  1 drop Both Eyes BID   influenza vaccine adjuvanted  0.5 mL  Intramuscular Tomorrow-1000   latanoprost  1 drop Both Eyes QHS   melatonin  5 mg Oral QHS   oxybutynin  5 mg Oral QHS   pantoprazole  40 mg Oral BID   sertraline  75 mg Oral Daily   simvastatin  20 mg Oral QPM   umeclidinium-vilanterol  1 puff Inhalation q morning      Subjective:   [name] was seen and examined today.  Sitting up in the chair, no acute complaints.  Tolerating diet.  No further bleeding.  Hoping to go back to the same facility.  Objective:   Vitals:   04/26/23 2015 04/27/23 0438 04/27/23 0817 04/27/23 0917  BP: (!) 116/52 (!) 112/53  (!) 107/49  Pulse: 66 69  65  Resp: 18 18  20   Temp: 98.2 F (36.8 C) 98.6 F (37 C)  97.7 F  (36.5 C)  TempSrc: Oral Oral  Oral  SpO2: 93% 96% 96% 96%  Weight:      Height:        Intake/Output Summary (Last 24 hours) at 04/27/2023 1119 Last data filed at 04/27/2023 1053 Gross per 24 hour  Intake 480 ml  Output 1000 ml  Net -520 ml     Wt Readings from Last 3 Encounters:  04/25/23 81.6 kg  04/16/23 82.3 kg  04/10/23 82.3 kg     Exam General: Alert and oriented x 3, NAD Cardiovascular: S1 S2 auscultated,  RRR Respiratory: Clear to auscultation bilaterally, no wheezing, rales Gastrointestinal: Soft, nontender, nondistended, + bowel sounds Ext: no pedal edema bilaterally Neuro: no new deficits Psych: Normal affect     Data Reviewed:  I have personally reviewed following labs    CBC Lab Results  Component Value Date   WBC 5.5 04/27/2023   RBC 2.76 (L) 04/27/2023   HGB 8.4 (L) 04/27/2023   HCT 27.2 (L) 04/27/2023   MCV 98.6 04/27/2023   MCH 30.4 04/27/2023   PLT 197 04/27/2023   MCHC 30.9 04/27/2023   RDW 15.9 (H) 04/27/2023   LYMPHSABS 1.3 04/26/2023   MONOABS 0.6 04/26/2023   EOSABS 0.1 04/26/2023   BASOSABS 0.0 04/26/2023     Last metabolic panel Lab Results  Component Value Date   NA 140 04/27/2023   K 3.8 04/27/2023   CL 106 04/27/2023   CO2 27 04/27/2023   BUN 10 04/27/2023   CREATININE 0.48 04/27/2023   GLUCOSE 120 (H) 04/27/2023   GFRNONAA >60 04/27/2023   GFRAA >60 09/14/2017   CALCIUM 8.6 (L) 04/27/2023   PHOS 3.2 05/05/2021   PROT 5.0 (L) 04/20/2023   ALBUMIN 3.0 (L) 04/20/2023   BILITOT 1.1 04/20/2023   ALKPHOS 27 (L) 04/20/2023   AST 16 04/20/2023   ALT 9 04/20/2023   ANIONGAP 7 04/27/2023    CBG (last 3)  No results for input(s): "GLUCAP" in the last 72 hours.    Coagulation Profile: No results for input(s): "INR", "PROTIME" in the last 168 hours.   Radiology Studies: I have personally reviewed the imaging studies  No results found.     Thad Ranger M.D. [facility] 04/27/2023, 11:19  AM  Available via Epic secure chat 7am-7pm After 7 pm, please refer to night coverage provider listed on amion.

## 2023-04-27 NOTE — Progress Notes (Signed)
Patient was wondering if she could restart her Fosamax this Sunday but MD stated still on hold because it could worsen her GI Bleed

## 2023-04-27 NOTE — Plan of Care (Signed)
Patient ambulated twice in hallway and twice to bathroom with front wheel walker, tolerated activity well, states she feels stronger.  Placement for rehab to be finalized tomorrow per social work note.

## 2023-04-27 NOTE — [place] FL2 (Signed)
[place] MEDICAID FL2 LEVEL OF CARE FORM     IDENTIFICATION  Patient Name: [name] Birthdate: 1940/09/10 Sex: female Admission Date (Current Location): 04/19/2023  [facility] and [place] Number:  Producer, television/film/video and Address:  [facility],  [address] 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   History of GI diverticular bleed 03/28/2023   GAD (generalized anxiety disorder) 03/28/2023   Diastolic heart failure (HCC) 03/19/2023   Class 1 obesity with body mass index (BMI) of 34.0 to 34.9 in adult 03/19/2023   Thoracic aorta atherosclerosis (HCC) 03/19/2023   Melena 01/05/2023   Hypotension 05/02/2022   UTI (urinary tract infection) 02/05/2022   Restless leg syndrome 02/05/2022   Senile osteoporosis 02/05/2022   Hypertension 02/05/2022   Pulmonary nodule 1 cm or greater in diameter 02/05/2022   GERD (gastroesophageal reflux disease) 02/05/2022   Upper GI bleed 01/07/2022   Iron deficiency anemia 05/02/2021   Chronic anemia    DOE (dyspnea on exertion) 11/18/2017   COPD (chronic obstructive pulmonary disease) (HCC) 11/18/2017   Diverticulosis of intestine with bleeding 09/05/2017   Acute respiratory failure with hypoxia (HCC) 08/07/2017   Depression with anxiety 08/07/2017   Hyperlipidemia 08/07/2017   Macular degeneration 08/07/2017   Glaucoma 08/07/2017   Weakness 08/07/2017   Dehydration 08/07/2017   Lower GI bleeding  10/14/2016   Diverticulosis of colon 10/14/2016   Anxiety 10/14/2016   Lower GI bleed 10/14/2016   GI bleed    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   Dermatochalasis of both upper eyelids 08/17/2014   Osteoarthritis, multiple sites 12/20/2011    Orientation RESPIRATION BLADDER Height & Weight     Self, Time, Situation, Place  Normal Incontinent, External catheter Weight: 81.6 kg Height:  5\' 1"  (154.9 cm)  BEHAVIORAL SYMPTOMS/MOOD NEUROLOGICAL BOWEL NUTRITION STATUS      Continent Diet (soft diet)  AMBULATORY STATUS COMMUNICATION OF NEEDS Skin   Limited Assist Verbally Normal                       Personal Care Assistance Level of Assistance  Bathing, Feeding, Dressing Bathing Assistance: Limited assistance Feeding assistance: Limited assistance Dressing Assistance: Limited assistance     Functional Limitations Info  Sight, Hearing, Speech Sight Info: Adequate Hearing Info: Adequate Speech Info: Adequate    SPECIAL CARE FACTORS FREQUENCY  PT (By licensed PT), OT (By licensed OT)     PT Frequency: 5x/wk OT Frequency: 5x/wk            Contractures Contractures Info: Not present    Additional Factors Info  Code Status, Allergies, Psychotropic Code Status Info: DNR Allergies Info: Morphine And Codeine, Aspirin, Duloxetine Hcl, Tymlos (Abaloparatide), Ventolin (Albuterol), Cefadroxil Psychotropic Info: Zoloft 75mg  po daily         Current Medications (04/27/2023):  This is the current hospital active medication list Current Facility-Administered Medications  Medication Dose Route Frequency Provider Last  Rate Last Admin   acetaminophen (TYLENOL) tablet 650 mg  650 mg Oral Q6H PRN Charlott Rakes, MD   650 mg at 04/24/23 2123   Or   acetaminophen (TYLENOL) suppository 650 mg  650 mg Rectal Q6H PRN Charlott Rakes, MD       Chlorhexidine Gluconate Cloth 2 % PADS 6 each  6 each Topical Daily Charlott Rakes, MD   6 each at 04/27/23 0658    dorzolamide-timolol (COSOPT) 2-0.5 % ophthalmic solution 1 drop  1 drop Both Eyes BID Charlott Rakes, MD   1 drop at 04/27/23 0905   guaiFENesin (ROBITUSSIN) 100 MG/5ML liquid 10 mL  10 mL Oral Q4H PRN Charlott Rakes, MD       latanoprost (XALATAN) 0.005 % ophthalmic solution 1 drop  1 drop Both Eyes QHS Charlott Rakes, MD   1 drop at 04/26/23 2140   melatonin tablet 5 mg  5 mg Oral QHS Charlott Rakes, MD   5 mg at 04/26/23 2141   Oral care mouth rinse  15 mL Mouth Rinse PRN Charlott Rakes, MD       oxybutynin (DITROPAN-XL) 24 hr tablet 5 mg  5 mg Oral QHS Charlott Rakes, MD   5 mg at 04/26/23 2141   pantoprazole (PROTONIX) EC tablet 40 mg  40 mg Oral BID Norva Pavlov, RPH   40 mg at 04/27/23 1610   prochlorperazine (COMPAZINE) injection 5 mg  5 mg Intravenous Q4H PRN Charlott Rakes, MD       sertraline (ZOLOFT) tablet 75 mg  75 mg Oral Daily Charlott Rakes, MD   75 mg at 04/27/23 9604   simvastatin (ZOCOR) tablet 20 mg  20 mg Oral QPM Charlott Rakes, MD   20 mg at 04/26/23 1827   umeclidinium-vilanterol (ANORO ELLIPTA) 62.5-25 MCG/ACT 1 puff  1 puff Inhalation q morning Charlott Rakes, MD   1 puff at 04/27/23 5409     Discharge Medications: Please see discharge summary for a list of discharge medications.  Relevant Imaging Results:  Relevant Lab Results:   Additional Information SSN: [id number]  Howell Rucks, RN

## 2023-04-27 NOTE — TOC Progression Note (Addendum)
Transition of Care [facility]) - Progression Note    Patient Details  Name: [name] MRN: [id number] Date of Birth: 1941/06/08  Transition of Care [facility]) CM/SW Contact  Howell Rucks, RN Phone Number: 04/27/2023, 9:21 AM  Clinical Narrative:  Call to [facility], states Stephens Shire, transfer coordinator is not working today, states will check with DON to see if can accept pt back today, await call back.    -12:36pm Call received from [place] w/[facility], reports she spoke with pt's spouse, who is is HCPOA, reports he spoke with pt and told her she needs to got to short term rehab for therapy before she can possibly return to her ALF, reports pt knows she is going to short term rehab. Magda Paganini states pt will need to admit to [facility], call number [phone], ext [phone]. NCM will call to inform pt/spouse would like a bed offer. Will need FL2 and [employer] auth.   -12:47pm Call to [facility] [place] at [phone], sw nurse, reports will need to speak with Social Worker, Personnel officer on Monday 10/28, to review for admission for short term rehab, reports to call same number and ask for [facility].   -3:23pm Call to pt's room to introduce role of TOC/NCM and review for dc planning, pt confirmed she is agreeable to short term rehab at [facility]. FL2 and PASRR completed. Will need to initiate insurance auth once short term rehab bed offer verified with [facility].     Expected Discharge Plan: Long Term Nursing Home Barriers to Discharge: Continued Medical Work up  Expected Discharge Plan and Services In-house Referral: NA Discharge Planning Services: NA Post Acute Care Choice: Resumption of Svcs/PTA Provider Living arrangements for the past 2 months: Assisted Living Facility                 DME Arranged: N/A DME Agency: NA                   Social Determinants of Health (SDOH) Interventions SDOH Screenings   Food Insecurity: No Food  Insecurity (04/22/2023)  Housing: Low Risk  (04/22/2023)  Transportation Needs: No Transportation Needs (04/22/2023)  Utilities: Not At Risk (04/22/2023)  Alcohol Screen: Low Risk  (04/04/2023)  Depression (PHQ2-9): Low Risk  (04/04/2023)  Financial Resource Strain: Low Risk  (04/04/2023)  Physical Activity: Insufficiently Active (04/04/2023)  Social Connections: Moderately Isolated (04/04/2023)  Stress: No Stress Concern Present (04/04/2023)  Tobacco Use: Medium Risk (04/21/2023)  Health Literacy: Adequate Health Literacy (04/04/2023)    Readmission Risk Interventions    04/21/2023    2:05 PM 03/29/2023    9:45 AM 01/07/2023    1:24 PM  Readmission Risk Prevention Plan  Transportation Screening Complete Complete Complete  PCP or Specialist Appt within 5-7 Days  Complete Complete  PCP or Specialist Appt within 3-5 Days Complete    Home Care Screening  Complete Complete  Medication Review (RN CM)  Complete Complete  HRI or Home Care Consult Complete    Social Work Consult for Recovery Care Planning/Counseling Complete    Palliative Care Screening Not Applicable    Medication Review Oceanographer) Complete

## 2023-04-27 NOTE — TOC PASRR Note (Signed)
30 Day PASRR Note   Patient Details  Name: [name] Date of Birth: [birth year]   Transition of Care [facility]) CM/SW Contact:    Howell Rucks, RN Phone Number: 04/27/2023, 3:19 PM  To Whom It May Concern:  Please be advised that this patient will require a short-term nursing home stay - anticipated 30 days or less for rehabilitation and strengthening.   The plan is for return home.

## 2023-04-27 NOTE — Plan of Care (Signed)

## 2023-04-27 NOTE — Plan of Care (Signed)
Patient not wearing SCD's (refusing) and not in room since she ambulates to the bathroom and up to chair.  Problem: Activity: Goal: Risk for activity intolerance will decrease Outcome: Progressing

## 2023-04-28 ENCOUNTER — Encounter (HOSPITAL_COMMUNITY): Payer: Self-pay | Admitting: Gastroenterology

## 2023-04-28 DIAGNOSIS — F418 Other specified anxiety disorders: Secondary | ICD-10-CM | POA: Diagnosis not present

## 2023-04-28 DIAGNOSIS — K922 Gastrointestinal hemorrhage, unspecified: Secondary | ICD-10-CM | POA: Diagnosis not present

## 2023-04-28 DIAGNOSIS — J410 Simple chronic bronchitis: Secondary | ICD-10-CM | POA: Diagnosis not present

## 2023-04-28 DIAGNOSIS — E66811 Obesity, class 1: Secondary | ICD-10-CM | POA: Diagnosis not present

## 2023-04-28 MED ORDER — GUAIFENESIN 100 MG/5ML PO LIQD: 10.0000 mL | ORAL | Status: DC | PRN

## 2023-04-28 NOTE — Consult Note (Signed)
[facility]    04/28/2023  Ashani Spivack [place] [birth year] [id number]  *[facility] [VBCI] remote coverage review for patient admitted to [facility] on 04/19/23  Primary Care Provider:  Octavia Heir, NP is a provider with [facility]  Insurance: [facility]   Patient was reviewed for less than 30 days readmission with high risk score with a 9 day length of stay for post hospital barriers to care.  Patient was screened for hospitalization and on behalf of [facility]  Care Coordination to assess for post hospital community care needs.  Patient is being considered for a skilled nursing facility level of care for post hospital transition. Patient is currently from [facility] and needs SNF rehab.  Reviewed inpatient [facility] team notes regarding SNF  and will need insurance auth.  If the patient goes to a [facility] affiliated facility then, patient can be followed by [facility] RN with traditional Medicare and approved Medicare Advantage plans.    Plan:   Will follow  and if needs to notify the [facility] RN can follow for any known or needs for transitional care needs for returning to post facility care coordination needs to return to community.  For questions or referrals, please contact:   Charlesetta Shanks, RN, BSN, CCM   [facility], [facility] Liaison Direct Dial: [phone] or secure chat Website: [email or web address]

## 2023-04-28 NOTE — TOC Progression Note (Addendum)
Transition of Care [facility]) - Progression Note    Patient Details  Name: [name] MRN: [id number] Date of Birth: 01-08-1941  Transition of Care [facility]) CM/SW Contact  Otelia Santee, LCSW Phone Number: 04/28/2023, 12:44 PM  Clinical Narrative:    Sherron Monday with Clydie Braun at [facility] who shares that there last SNF bed was taken by a resident at the facility and will have no beds available for this pt. She shares pt is not able to return to ALF at [facility] until she has completed SNF. There are SNF beds at [facility] however they are private pay only.   CSW met with pt at bedside to discuss. Pt states she would prefer not to private pay for SNF. She is agreeable to referrals being sent for placement at other SNF facilities and would prefer facility close to [facility]. Referrals have been sent out and currently awaiting bed offers.   ADDENDUM: Pt's PASRR returned:  [id number] E. Met with pt and spouse at bedside to review bed offers. Pt and spouse have selected SNF at [facility]. Insurance Berkley Harvey as been started and is currently pending.    Expected Discharge Plan: Long Term Nursing Home Barriers to Discharge: Continued Medical Work up  Expected Discharge Plan and Services In-house Referral: NA Discharge Planning Services: NA Post Acute Care Choice: Resumption of Svcs/PTA Provider Living arrangements for the past 2 months: Assisted Living Facility Expected Discharge Date: 04/28/23               DME Arranged: N/A DME Agency: NA                   Social Determinants of Health (SDOH) Interventions SDOH Screenings   Food Insecurity: No Food Insecurity (04/22/2023)  Housing: Low Risk  (04/22/2023)  Transportation Needs: No Transportation Needs (04/22/2023)  Utilities: Not At Risk (04/22/2023)  Alcohol Screen: Low Risk  (04/04/2023)  Depression (PHQ2-9): Low Risk  (04/04/2023)  Financial Resource Strain: Low Risk  (04/04/2023)  Physical Activity:  Insufficiently Active (04/04/2023)  Social Connections: Moderately Isolated (04/04/2023)  Stress: No Stress Concern Present (04/04/2023)  Tobacco Use: Medium Risk (04/21/2023)  Health Literacy: Adequate Health Literacy (04/04/2023)    Readmission Risk Interventions    04/21/2023    2:05 PM 03/29/2023    9:45 AM 01/07/2023    1:24 PM  Readmission Risk Prevention Plan  Transportation Screening Complete Complete Complete  PCP or Specialist Appt within 5-7 Days  Complete Complete  PCP or Specialist Appt within 3-5 Days Complete    Home Care Screening  Complete Complete  Medication Review (RN CM)  Complete Complete  HRI or Home Care Consult Complete    Social Work Consult for Recovery Care Planning/Counseling Complete    Palliative Care Screening Not Applicable    Medication Review Oceanographer) Complete

## 2023-04-28 NOTE — Plan of Care (Signed)

## 2023-04-28 NOTE — TOC Progression Note (Signed)
Transition of Care [facility]) - Progression Note    Patient Details  Name: [name] MRN: [id number] Date of Birth: 08-Jun-1941  Transition of Care [facility]) CM/SW Contact  Howell Rucks, RN Phone Number: 04/28/2023, 9:25 AM  Clinical Narrative:   Cosigned FL2 and 30 day PASRR Note uploaded to [facility] for Level 2 PASRR.     Expected Discharge Plan: Long Term Nursing Home Barriers to Discharge: Continued Medical Work up  Expected Discharge Plan and Services In-house Referral: NA Discharge Planning Services: NA Post Acute Care Choice: Resumption of Svcs/PTA Provider Living arrangements for the past 2 months: Assisted Living Facility                 DME Arranged: N/A DME Agency: NA                   Social Determinants of Health (SDOH) Interventions SDOH Screenings   Food Insecurity: No Food Insecurity (04/22/2023)  Housing: Low Risk  (04/22/2023)  Transportation Needs: No Transportation Needs (04/22/2023)  Utilities: Not At Risk (04/22/2023)  Alcohol Screen: Low Risk  (04/04/2023)  Depression (PHQ2-9): Low Risk  (04/04/2023)  Financial Resource Strain: Low Risk  (04/04/2023)  Physical Activity: Insufficiently Active (04/04/2023)  Social Connections: Moderately Isolated (04/04/2023)  Stress: No Stress Concern Present (04/04/2023)  Tobacco Use: Medium Risk (04/21/2023)  Health Literacy: Adequate Health Literacy (04/04/2023)    Readmission Risk Interventions    04/21/2023    2:05 PM 03/29/2023    9:45 AM 01/07/2023    1:24 PM  Readmission Risk Prevention Plan  Transportation Screening Complete Complete Complete  PCP or Specialist Appt within 5-7 Days  Complete Complete  PCP or Specialist Appt within 3-5 Days Complete    Home Care Screening  Complete Complete  Medication Review (RN CM)  Complete Complete  HRI or Home Care Consult Complete    Social Work Consult for Recovery Care Planning/Counseling Complete    Palliative Care Screening Not Applicable    Medication  Review Oceanographer) Complete

## 2023-04-28 NOTE — Progress Notes (Addendum)
[facility]                                                                              Lori-Anne Abdalla, is a 82 y.o. female, DOB - 01/31/1941, GEX:[id number] Admit date - 04/19/2023    Outpatient Primary MD for the patient is [name], [name], NP  LOS - 9  days  No chief complaint on file.      Brief summary   Patient is a 82 year old female with anxiety,depression, chronic diastolic CHF, osteoarthritis, glaucoma, HLP, HTN, pulmonary nodule, restless leg syndrome, osteoporosis, COPD, aortic arthrosclerosis, GERD, history of diverticular bleed with embolization, UGI bleed, chronic iron deficiency anemia due to chronic blood loss with multiple admissions due to GI bleed most recently seen from 03/27/2023 until 03/29/2023 with recent EGD in August 2024 that showed gastritis presented to ED with anemia associated with several episodes of melenotic stools. In ED, vital signs stable, hemoglobin 5.9 with MCV 103.8, FOBT positive CTA for GI bleed showed no active GI bleeding, cholelithiasis without cholecystitis, chronic L1 vertebral body compression fracture. Patient was admitted for further workup.   Assessment & Plan    Principal Problem: Acute UGI bleed Acute on chronic anemia, macrocytic.  Chronic anemia due to GI blood loss -Recent admission in 03/2023 for GI bleeding. EGD in August showing gastritis. -Received 2 units packed RBCs, GI was consulted  -Underwent EGD 10/21 showed acute gastritis, multiple gastric polyps. Normal examined duodenum.  -Capsule endoscopy 10/22 showed focal area of red blood in proximal colon -s/p colonoscopy with a 2-day bowel prep 10/25 showed mild to mod proximal colonic ischemic looking mucosa - biopsies taken.  -GI will follow for biopsy results.  -Continue PPI, no further bleeding, continue soft diet.  -Awaiting SNF   Active problems Hypokalemia: Resolved.   Thrombocytopenia: Likely from acute illness.  Resolved.      Anxiety/depression: -Stable, continue sertraline   Hyperlipidemia Thoracic aortic atherosclerosis: Stable, continue home statin.   HTN: BP is soft, continue to hold metoprolol    COPD: -No wheezing, continue Anoro Ellipta   Pulmonary nodule > 1 cm: Need follow-up with PCP/pulmonary as OP if she desires   Generalized deconditioning/debility Resident of ALF: PT OT evaluation recommended skilled nursing facility    Class 1 obesity with body mass index (BMI) of 34.0 to 34.9 in adult Estimated body mass index is 33.99 kg/m as calculated from the following:   Height as of this encounter: 5\' 1"  (1.549 m).   Weight as of this encounter: 81.6 kg.  Code Status: DNR DVT Prophylaxis:  SCDs Start: 04/19/23 1443   Level of Care: Level of care: Telemetry Family Communication: Updated patient Disposition Plan:      Remains inpatient appropriate: Needs SNF, medically stable   Procedures:  EGD, capsule endoscopy, colonoscopy  Consultants:   Gastroenterology  Antimicrobials:   Anti-infectives (From admission, onward)    None          Medications  Chlorhexidine Gluconate Cloth  6 each Topical Daily   dorzolamide-timolol  1 drop Both Eyes BID   latanoprost  1 drop Both Eyes QHS   melatonin  5 mg Oral  QHS   oxybutynin  5 mg Oral QHS   pantoprazole  40 mg Oral BID   sertraline  75 mg Oral Daily   simvastatin  20 mg Oral QPM   umeclidinium-vilanterol  1 puff Inhalation q morning      Subjective:   [name] was seen and examined today.  Sitting up in the chair, no complaints, no bleeding.  Tolerating diet, awaiting SNF.  Objective:   Vitals:   04/27/23 0917 04/27/23 2008 04/28/23 0436 04/28/23 0820  BP: (!) 107/49 (!) 94/59 (!) 107/49   Pulse: 65 79 69   Resp: 20     Temp: 97.7 F (36.5 C) 98 F (36.7 C) 99.1 F (37.3 C)   TempSrc: Oral Oral Oral   SpO2: 96% 97% 94% 96%  Weight:      Height:        Intake/Output Summary (Last 24 hours) at 04/28/2023  1334 Last data filed at 04/27/2023 2127 Gross per 24 hour  Intake 120 ml  Output --  Net 120 ml     Wt Readings from Last 3 Encounters:  04/25/23 81.6 kg  04/16/23 82.3 kg  04/10/23 82.3 kg    Physical Exam General: Alert and oriented x 3, NAD Cardiovascular: S1 S2 clear, RRR.  Respiratory: CTAB Gastrointestinal: Soft, nontender, nondistended, NBS Ext: no pedal edema bilaterally Neuro: no new deficits psych: Normal affect     Data Reviewed:  I have personally reviewed following labs    CBC Lab Results  Component Value Date   WBC 5.5 04/27/2023   RBC 2.76 (L) 04/27/2023   HGB 8.4 (L) 04/27/2023   HCT 27.2 (L) 04/27/2023   MCV 98.6 04/27/2023   MCH 30.4 04/27/2023   PLT 197 04/27/2023   MCHC 30.9 04/27/2023   RDW 15.9 (H) 04/27/2023   LYMPHSABS 1.3 04/26/2023   MONOABS 0.6 04/26/2023   EOSABS 0.1 04/26/2023   BASOSABS 0.0 04/26/2023     Last metabolic panel Lab Results  Component Value Date   NA 140 04/27/2023   K 3.8 04/27/2023   CL 106 04/27/2023   CO2 27 04/27/2023   BUN 10 04/27/2023   CREATININE 0.48 04/27/2023   GLUCOSE 120 (H) 04/27/2023   GFRNONAA >60 04/27/2023   GFRAA >60 09/14/2017   CALCIUM 8.6 (L) 04/27/2023   PHOS 3.2 05/05/2021   PROT 5.0 (L) 04/20/2023   ALBUMIN 3.0 (L) 04/20/2023   BILITOT 1.1 04/20/2023   ALKPHOS 27 (L) 04/20/2023   AST 16 04/20/2023   ALT 9 04/20/2023   ANIONGAP 7 04/27/2023    CBG (last 3)  No results for input(s): "GLUCAP" in the last 72 hours.    Coagulation Profile: No results for input(s): "INR", "PROTIME" in the last 168 hours.   Radiology Studies: I have personally reviewed the imaging studies  No results found.     Thad Ranger M.D. [facility] 04/28/2023, 1:34 PM  Available via Epic secure chat 7am-7pm After 7 pm, please refer to night coverage provider listed on amion.

## 2023-04-28 NOTE — TOC CM/SW Note (Signed)
 CMS list of facilities and star ratings provided to pt to review for facility preference.       [facility] [address] [place], [place] [address] [phone] Overall rating ??  Below average  [facility] at the [facility] [address] [place], [place] [address] [phone] Overall rating ?? Below average  [facility] [address] [place], [place] [address] [phone] Overall rating?? Below average  [facility] [address] [place], [place] [address] [phone] Overall rating ? Much below average  [facility] [address] [place], [place] [address] [phone] Overall rating ??? Average  [facility] and [facility] [address] [place], [place] [address] [phone] Overall rating ? Much below average   [facility] [address] [place], [place] [address] [phone] Overall rating ? Much below average  [employer] [facility] [employer] [address] [place], [place] [address] [phone] Overall rating ??? Average  [facility] [address] [place], [place] [address] [phone] Overall rating ? Much below average  [facility] and [facility] [address] [place], [place] [address] [phone] Overall rating ? Much below average  [name] [address] [place], [place] [address] [phone] Overall rating ???? Above average  [facility] [address] [place], [place] [address] [phone] Overall rating ????? Much above average  [facility] [address] [place], [place] [address] [phone] Overall rating ???? Above average  [facility] [address] [place], [place]  [address] [phone] Overall rating ????? Much above average  [facility] [address] [place], [place] [address] [phone] Overall rating ????  [facility] [address] [place], [place] [address] [phone] Overall rating ????? Much above average  [place] [facility] [address] [place], [place] [address] [phone] Overall rating ????? Much above average  [facility] [address] [place], [place] [address] [phone] Overall rating ? Much below average  [facility] [address] [place], [place] [address] [phone] Overall rating ??? Average  [facility] [address] [place], [place] [address] [phone] Overall rating ????? Much above average  [facility] [address] [place], [place] [address] [phone] Overall rating ??? Average  [facility] [address] [place], [place] [address] [phone] Overall rating ?? Below average  [facility] [address] [place], [place] [address] [phone] Overall rating ??? Average  [facility] [address] [place], [place] [address] [phone] Overall rating ????? Much above average  [facility] [address] [place], [place] [address] [phone] Overall rating ? Much below average  [facility]	[place] at the [facility] at [facility], [place] [address] [phone] Overall rating ????? Much above average  [facility] [address] [place], [place] [address] [phone] Overall rating ? Much below average  [facility] [address] [place], [place] [address] [phone] Overall rating ??? Average  [facility] and  [facility] [address] [place], [place] [address] [phone] Overall rating ??  Below average  [facility] [address] [place], [place] [address] [phone] Overall rating ? Much below average  [employer] [facility] [address] [place], [place] [address] [phone] Overall rating ??? Average  [facility] [address] [place], [place] [address] [phone] Overall rating ? Much below average  [facility] [address] [place], [place] [address] [phone] Overall rating ??? Average  [facility] and [facility] [address] [place], [place] [address] [phone] Overall rating ? Much below average  [place] [address] [place], [place] [address] [phone] Overall rating ????? Much above average  [facility] [address] [place], [place] [address] [phone] Overall rating ? Much below average  [facility] [address] [place], [place] [address] [phone] Overall rating ???? Above average  [facility] [address] [place], [place] [address] [phone] Overall rating ????? Above average  [facility] and [facility] [address] [place], [place] [address] [phone] Overall rating ???? Above average  [facility] [address] [place], [place] [address] [phone] Overall rating ????? Much above average  [facility] [address] [place], [place] [address] [phone] Overall rating ? Much below average  [facility] [address] [place], [place] [address] [phone] Overall rating ????? Much above average  [facility] [address] [place], [place] [address] [phone] Overall rating ? Much below  average  [facility] [address] [place], [place] [address] [phone] Overall rating ????? Much above average  [facility] [address] [place], [place] [address] [phone] Overall rating ????? Much above average  [facility] [address] [place], [place] [address] [phone] Overall rating ??? Average  [facility] [address] [place], [place] [address] [phone] Overall rating ??? Average  [facility] [address] [place], [place] [address] [phone] Overall rating ???? Above average  [facility] [address] [place], [place] [address] [phone] Overall rating ?? Below average  [facility] [address] [place], [place] [address] [phone] Overall rating ???? Above average  [facility] and [facility] [address] [place], [place] [address] [phone] Overall rating ?? Below average  [facility] [address] [place], [place] [address] [phone] Overall rating ??

## 2023-04-28 NOTE — TOC Progression Note (Signed)
Transition of Care [facility]) - Progression Note    Patient Details  Name: [name] MRN: [id number] Date of Birth: [birth year]  [facility]) CM/SW Contact  Otelia Santee, LCSW Phone Number: 04/28/2023, 11:04 AM  Clinical Narrative:    Left voicemail with Amber at [facility] [phone]) for plan for pt to transfer to their SNF. Awaiting return call.    Expected Discharge Plan: Long Term Nursing Home Barriers to Discharge: Continued Medical Work up  Expected Discharge Plan and Services In-house Referral: NA Discharge Planning Services: NA Post Acute Care Choice: Resumption of Svcs/PTA Provider Living arrangements for the past 2 months: Assisted Living Facility Expected Discharge Date: 04/28/23               DME Arranged: N/A DME Agency: NA                   Social Determinants of Health (SDOH) Interventions SDOH Screenings   Food Insecurity: No Food Insecurity (04/22/2023)  Housing: Low Risk  (04/22/2023)  Transportation Needs: No Transportation Needs (04/22/2023)  Utilities: Not At Risk (04/22/2023)  Alcohol Screen: Low Risk  (04/04/2023)  Depression (PHQ2-9): Low Risk  (04/04/2023)  Financial Resource Strain: Low Risk  (04/04/2023)  Physical Activity: Insufficiently Active (04/04/2023)  Social Connections: Moderately Isolated (04/04/2023)  Stress: No Stress Concern Present (04/04/2023)  Tobacco Use: Medium Risk (04/21/2023)  Health Literacy: Adequate Health Literacy (04/04/2023)    Readmission Risk Interventions    04/21/2023    2:05 PM 03/29/2023    9:45 AM 01/07/2023    1:24 PM  Readmission Risk Prevention Plan  Transportation Screening Complete Complete Complete  PCP or Specialist Appt within 5-7 Days  Complete Complete  PCP or Specialist Appt within 3-5 Days Complete    Home Care Screening  Complete Complete  Medication Review (RN CM)  Complete Complete  HRI or Home Care Consult Complete    Social Work Consult for Recovery Care  Planning/Counseling Complete    Palliative Care Screening Not Applicable    Medication Review Oceanographer) Complete

## 2023-04-29 DIAGNOSIS — M6281 Muscle weakness (generalized): Secondary | ICD-10-CM | POA: Diagnosis not present

## 2023-04-29 DIAGNOSIS — D500 Iron deficiency anemia secondary to blood loss (chronic): Secondary | ICD-10-CM

## 2023-04-29 DIAGNOSIS — Z9181 History of falling: Secondary | ICD-10-CM | POA: Diagnosis not present

## 2023-04-29 DIAGNOSIS — E66811 Obesity, class 1: Secondary | ICD-10-CM | POA: Diagnosis not present

## 2023-04-29 DIAGNOSIS — J410 Simple chronic bronchitis: Secondary | ICD-10-CM | POA: Diagnosis not present

## 2023-04-29 DIAGNOSIS — K922 Gastrointestinal hemorrhage, unspecified: Secondary | ICD-10-CM | POA: Diagnosis not present

## 2023-04-29 DIAGNOSIS — R2681 Unsteadiness on feet: Secondary | ICD-10-CM | POA: Diagnosis not present

## 2023-04-29 DIAGNOSIS — F418 Other specified anxiety disorders: Secondary | ICD-10-CM | POA: Diagnosis not present

## 2023-04-29 LAB — BASIC METABOLIC PANEL
Anion gap: 9 (ref 5–15)
BUN: 8 mg/dL (ref 8–23)
CO2: 26 mmol/L (ref 22–32)
Calcium: 8.7 mg/dL — ABNORMAL LOW (ref 8.9–10.3)
Chloride: 106 mmol/L (ref 98–111)
Creatinine, Ser: 0.52 mg/dL (ref 0.44–1.00)
GFR, Estimated: >60 mL/min (ref >60)
Glucose, Bld: 108 mg/dL — ABNORMAL HIGH (ref 70–99)
Potassium: 3.7 mmol/L (ref 3.5–5.1)
Sodium: 141 mmol/L (ref 135–145)

## 2023-04-29 LAB — CBC
HCT: 25.6 % — ABNORMAL LOW (ref 36.0–46.0)
Hemoglobin: 8 g/dL — ABNORMAL LOW (ref 12.0–15.0)
MCH: 30.4 pg (ref 26.0–34.0)
MCHC: 31.3 g/dL (ref 30.0–36.0)
MCV: 97.3 fL (ref 80.0–100.0)
Platelets: 189 10*3/uL (ref 150–400)
RBC: 2.63 MIL/uL — ABNORMAL LOW (ref 3.87–5.11)
RDW: 15.6 % — ABNORMAL HIGH (ref 11.5–15.5)
WBC: 5.2 10*3/uL (ref 4.0–10.5)
nRBC: 0 % (ref 0.0–0.2)

## 2023-04-29 NOTE — [place] FL2 (Signed)
[place] MEDICAID FL2 LEVEL OF CARE FORM     IDENTIFICATION  Patient Name: [name] Birthdate: 1941/02/07 Sex: female Admission Date (Current Location): 04/19/2023  [facility] and [place] Number:  Producer, television/film/video and Address:  [facility],  [address] [place], [place] [address]      Provider Number: [id number]  Attending Physician Name and Address:  Cathren Harsh, MD  Relative Name and Phone Number:  Satoya, Rosillo (Spouse)  [phone] (Mobile)    Current Level of Care: Hospital Recommended Level of Care: Skilled Nursing Facility Prior Approval Number:    Date Approved/Denied:   PASRR Number: [id number]  Discharge Plan: SNF    Current Diagnoses: Patient Active Problem List   Diagnosis Date Noted   UGI bleed 04/19/2023   Recurrent depression (HCC) 04/13/2023   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   History of GI diverticular bleed 03/28/2023   GAD (generalized anxiety disorder) 03/28/2023   Diastolic heart failure (HCC) 03/19/2023   Class 1 obesity with body mass index (BMI) of 34.0 to 34.9 in adult 03/19/2023   Thoracic aorta atherosclerosis (HCC) 03/19/2023   Melena 01/05/2023   Hypotension 05/02/2022   UTI (urinary tract infection) 02/05/2022   Restless leg syndrome 02/05/2022   Senile osteoporosis 02/05/2022   Hypertension 02/05/2022   Pulmonary nodule 1 cm or greater in diameter 02/05/2022   GERD (gastroesophageal reflux disease) 02/05/2022   Upper GI bleed 01/07/2022   Iron deficiency anemia 05/02/2021   Chronic anemia    DOE (dyspnea on exertion) 11/18/2017   COPD (chronic obstructive pulmonary disease) (HCC) 11/18/2017   Diverticulosis of intestine with bleeding 09/05/2017   Acute respiratory failure with hypoxia (HCC) 08/07/2017   Depression with anxiety 08/07/2017   Hyperlipidemia 08/07/2017   Macular degeneration 08/07/2017   Glaucoma 08/07/2017   Weakness 08/07/2017   Dehydration 08/07/2017   Lower GI  bleeding 10/14/2016   Diverticulosis of colon 10/14/2016   Anxiety 10/14/2016   Lower GI bleed 10/14/2016   GI bleed    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   Dermatochalasis of both upper eyelids 08/17/2014   Osteoarthritis, multiple sites 12/20/2011    Orientation RESPIRATION BLADDER Height & Weight     Self, Time, Situation, Place  Normal Incontinent, External catheter Weight: 179 lb 14.3 oz (81.6 kg) Height:  5\' 1"  (154.9 cm)  BEHAVIORAL SYMPTOMS/MOOD NEUROLOGICAL BOWEL NUTRITION STATUS      Continent Diet (soft diet)  AMBULATORY STATUS COMMUNICATION OF NEEDS Skin   Limited Assist Verbally Normal                       Personal Care Assistance Level of Assistance  Bathing, Feeding, Dressing Bathing Assistance: Limited assistance Feeding assistance: Limited assistance Dressing Assistance: Limited assistance     Functional Limitations Info  Sight, Hearing, Speech Sight Info: Adequate Hearing Info: Adequate Speech Info: Adequate    SPECIAL CARE FACTORS FREQUENCY  PT (By licensed PT), OT (By licensed OT)     PT Frequency: 5x/wk OT Frequency: 5x/wk            Contractures Contractures Info: Not present    Additional Factors Info  Code Status, Allergies, Psychotropic Code Status Info: DNR Allergies Info: Morphine And Codeine, Aspirin, Duloxetine Hcl, Tymlos (Abaloparatide), Ventolin (Albuterol), Cefadroxil Psychotropic Info: Zoloft 75mg  po daily         Current Medications (04/29/2023):  This is the current hospital active medication list Current Facility-Administered Medications  Medication Dose  Route Frequency Provider Last Rate Last Admin   acetaminophen (TYLENOL) tablet 650 mg  650 mg Oral Q6H PRN Charlott Rakes, MD   650 mg at 04/24/23 2123   Or   acetaminophen (TYLENOL) suppository 650 mg  650 mg Rectal Q6H PRN Charlott Rakes, MD       Chlorhexidine Gluconate Cloth 2 % PADS 6 each  6 each Topical Daily Charlott Rakes, MD    6 each at 04/27/23 0658   dorzolamide-timolol (COSOPT) 2-0.5 % ophthalmic solution 1 drop  1 drop Both Eyes BID Charlott Rakes, MD   1 drop at 04/28/23 0923   guaiFENesin (ROBITUSSIN) 100 MG/5ML liquid 10 mL  10 mL Oral Q4H PRN Charlott Rakes, MD       latanoprost (XALATAN) 0.005 % ophthalmic solution 1 drop  1 drop Both Eyes QHS Charlott Rakes, MD   1 drop at 04/28/23 2121   melatonin tablet 5 mg  5 mg Oral QHS Charlott Rakes, MD   5 mg at 04/28/23 2120   Oral care mouth rinse  15 mL Mouth Rinse PRN Charlott Rakes, MD       oxybutynin (DITROPAN-XL) 24 hr tablet 5 mg  5 mg Oral QHS Charlott Rakes, MD   5 mg at 04/28/23 2120   pantoprazole (PROTONIX) EC tablet 40 mg  40 mg Oral BID Norva Pavlov, RPH   40 mg at 04/28/23 2120   prochlorperazine (COMPAZINE) injection 5 mg  5 mg Intravenous Q4H PRN Charlott Rakes, MD       sertraline (ZOLOFT) tablet 75 mg  75 mg Oral Daily Charlott Rakes, MD   75 mg at 04/28/23 9562   simvastatin (ZOCOR) tablet 20 mg  20 mg Oral QPM Charlott Rakes, MD   20 mg at 04/28/23 1819   umeclidinium-vilanterol (ANORO ELLIPTA) 62.5-25 MCG/ACT 1 puff  1 puff Inhalation q morning Charlott Rakes, MD   1 puff at 04/28/23 1308     Discharge Medications: Please see discharge summary for a list of discharge medications.  Relevant Imaging Results:  Relevant Lab Results:   Additional Information SSN: [id number]  Otelia Santee, LCSW

## 2023-04-29 NOTE — TOC Transition Note (Signed)
Transition of Care [facility]) - CM/SW Discharge Note   Patient Details  Name: [name] MRN: [id number] Date of Birth: 06-05-1941  Transition of Care [facility]) CM/SW Contact:  Otelia Santee, LCSW Phone Number: 04/29/2023, 11:12 AM   Clinical Narrative:    Received call from [place] [facility] who share that they are going to accept pt to SNF at [facility] with plan for pt to transfer to [facility] once bed become available. Pt and spouse agreeable to this plan.  Pt is able to transfer to SNF today and will be going to room 15. RN to call report to [phone] and ask for nurses office (ext [phone]) or nurses desk (ext [phone]). PTAR called at 11:07am. DC packet with signed DNR placed at RN station.    Final next level of care: Skilled Nursing Facility Barriers to Discharge: Barriers Resolved   Patient Goals and CMS Choice CMS Medicare.gov Compare Post Acute Care list provided to::  (NA) Choice offered to / list presented to :  (NA)  Discharge Placement PASRR number recieved: 04/28/23 PASRR number recieved: 04/28/23            Patient chooses bed at: [facility] Patient to be transferred to facility by: PTAR Name of family member notified: Patient nad spouse Patient and family notified of of transfer: 04/29/23  Discharge Plan and Services Additional resources added to the After Visit Summary for   In-house Referral: NA Discharge Planning Services: NA Post Acute Care Choice: Resumption of Svcs/PTA Provider          DME Arranged: N/A DME Agency: NA                  Social Determinants of Health (SDOH) Interventions SDOH Screenings   Food Insecurity: No Food Insecurity (04/22/2023)  Housing: Low Risk  (04/22/2023)  Transportation Needs: No Transportation Needs (04/22/2023)  Utilities: Not At Risk (04/22/2023)  Alcohol Screen: Low Risk  (04/04/2023)  Depression (PHQ2-9): Low Risk  (04/04/2023)  Financial Resource Strain: Low Risk   (04/04/2023)  Physical Activity: Insufficiently Active (04/04/2023)  Social Connections: Moderately Isolated (04/04/2023)  Stress: No Stress Concern Present (04/04/2023)  Tobacco Use: Medium Risk (04/21/2023)  Health Literacy: Adequate Health Literacy (04/04/2023)     Readmission Risk Interventions    04/29/2023   11:10 AM 04/21/2023    2:05 PM 03/29/2023    9:45 AM  Readmission Risk Prevention Plan  Transportation Screening Complete Complete Complete  PCP or Specialist Appt within 5-7 Days   Complete  PCP or Specialist Appt within 3-5 Days Complete Complete   Home Care Screening   Complete  Medication Review (RN CM)   Complete  HRI or Home Care Consult Complete Complete   Social Work Consult for Recovery Care Planning/Counseling Complete Complete   Palliative Care Screening Not Applicable Not Applicable   Medication Review [employer]) Complete Complete

## 2023-04-29 NOTE — Discharge Summary (Signed)
Physician Discharge Summary   Patient: [name] MRN: [id number] DOB: March 01, 1941  Admit date:     04/19/2023  Discharge date: 04/29/23  Discharge Physician: Thad Ranger, MD    PCP: Octavia Heir, NP   Recommendations at discharge:   Please follow-up with GI within next 1 to 2 weeks, follow-up on biopsy results from the colonoscopy. Continue Protonix 40 mg twice daily Pulmonary nodule > 1 cm  Discharge Diagnoses:  Acute upper GI bleed Acute on chronic blood loss anemia, iron deficiency Hypokalemia Thrombocytopenia   Depression with anxiety   Hyperlipidemia   Glaucoma   COPD (chronic obstructive pulmonary disease) (HCC)   Iron deficiency anemia   Hypertension   GERD (gastroesophageal reflux disease)   Class 1 obesity with body mass index (BMI) of 34.0 to 34.9 in adult   Thoracic aorta atherosclerosis [facility])   Hospital Course: Patient is a 82 year old female with anxiety,depression, chronic diastolic CHF, osteoarthritis, glaucoma, HLP, HTN, pulmonary nodule, restless leg syndrome, osteoporosis, COPD, aortic arthrosclerosis, GERD, history of diverticular bleed with embolization, UGI bleed, chronic iron deficiency anemia due to chronic blood loss with multiple admissions due to GI bleed most recently seen from 03/27/2023 until 03/29/2023 with recent EGD in August 2024 that showed gastritis presented to ED with anemia associated with several episodes of melenotic stools. In ED, vital signs stable, hemoglobin 5.9 with MCV 103.8, FOBT positive CTA for GI bleed showed no active GI bleeding, cholelithiasis without cholecystitis, chronic L1 vertebral body compression fracture. Patient was admitted for further workup.  Assessment and Plan:  Acute UGI bleed Acute on chronic anemia, macrocytic.  Chronic anemia due to GI blood loss -Recent admission in 03/2023 for GI bleeding. EGD in August showing gastritis. -Received 2 units packed RBCs, GI was consulted  -Underwent EGD 10/21 showed  acute gastritis, multiple gastric polyps. Normal examined duodenum.  -Capsule endoscopy 10/22 showed focal area of red blood in proximal colon -s/p colonoscopy with a 2-day bowel prep 10/25 showed mild to mod proximal colonic ischemic looking mucosa - biopsies taken.  -GI will follow for biopsy results, still pending.  -Continue PPI, no further bleeding, continue soft diet.   Hypokalemia: Resolved.   Thrombocytopenia: Likely from acute illness.  Resolved.     Anxiety/depression: -Stable, continue sertraline   Hyperlipidemia Thoracic aortic atherosclerosis: Stable, continue home statin.   HTN: BP is stable   COPD: -No wheezing, continue Anoro Ellipta   Pulmonary nodule > 1 cm: Need follow-up with PCP/pulmonary   Generalized deconditioning/debility Resident of ALF: PT OT evaluation recommended skilled nursing facility    Class 1 obesity with body mass index (BMI) of 34.0 to 34.9 in adult Estimated body mass index is 33.99 kg/m as calculated from the following:   Height as of this encounter: 5\' 1"  (1.549 m).   Weight as of this encounter: 81.6 kg.       Pain control - [employer] Controlled Substance Reporting System database was reviewed. and patient was instructed, not to drive, operate heavy machinery, perform activities at heights, swimming or participation in water activities or provide baby-sitting services while on Pain, Sleep and Anxiety Medications; until their outpatient Physician has advised to do so again. Also recommended to not to take more than prescribed Pain, Sleep and Anxiety Medications.  Consultants: GI Procedures performed: EGD, capsule endoscopy, colonoscopy Disposition: Skilled nursing facility Diet recommendation:  Discharge Diet Orders (From admission, onward)     Start     Ordered   04/28/23 0000  Diet -  low sodium heart healthy        04/28/23 0933            DISCHARGE MEDICATION: Allergies as of 04/29/2023       Reactions    Morphine And Codeine Itching   Aspirin Other (See Comments)   Unknown reaction   Duloxetine Hcl Other (See Comments)   Unknown reaction   Tymlos [abaloparatide] Other (See Comments)   Unknown reaction   Ventolin [albuterol] Other (See Comments)   rapid heart beat   Cefadroxil Hives   Patient can take amoxicillin and cipro        Medication List     STOP taking these medications    METOPROLOL TARTRATE PO       TAKE these medications    acetaminophen 650 MG CR tablet Commonly known as: TYLENOL Take 1,300 mg by mouth at bedtime.   acetaminophen 500 MG tablet Commonly known as: TYLENOL Take 1,000 mg by mouth every 8 (eight) hours as needed for moderate pain (pain score 4-6).   albuterol 108 (90 Base) MCG/ACT inhaler Commonly known as: VENTOLIN HFA Inhale 2 puffs into the lungs every 4 (four) hours as needed for wheezing or shortness of breath.   alendronate 70 MG tablet Commonly known as: FOSAMAX Take 70 mg by mouth every Sunday. Take with a full glass of water on an empty stomach.   Align 4 MG Caps Take 4 mg by mouth at bedtime.   Anoro Ellipta 62.5-25 MCG/ACT Aepb Generic drug: umeclidinium-vilanterol Inhale 1 puff into the lungs every morning.   cetirizine 5 MG chewable tablet Commonly known as: ZYRTEC Chew 5 mg by mouth daily as needed for allergies.   docusate sodium 100 MG capsule Commonly known as: COLACE Take 200 mg by mouth at bedtime.   dorzolamide-timolol 2-0.5 % ophthalmic solution Commonly known as: COSOPT Place 1 drop into both eyes 2 (two) times daily.   ferrous gluconate 240 (27 FE) MG tablet Commonly known as: FERGON Take 240 mg by mouth every morning. Take without food   fluticasone 50 MCG/ACT nasal spray Commonly known as: FLONASE Place 1 spray into both nostrils daily as needed for allergies or rhinitis.   guaiFENesin 100 MG/5ML liquid Commonly known as: ROBITUSSIN Take 10 mLs by mouth every 4 (four) hours as needed for cough or  to loosen phlegm.   latanoprost 0.005 % ophthalmic solution Commonly known as: XALATAN Place 1 drop into both eyes at bedtime.   LIDOCAINE EX Apply 1 application  topically in the morning and at bedtime. Right shoulder to elbow   LIDOCAINE EX Apply 1 Application topically daily. Right Ankle.   oxybutynin 5 MG 24 hr tablet Commonly known as: DITROPAN-XL Take 5 mg by mouth at bedtime.   pantoprazole 40 MG tablet Commonly known as: PROTONIX Take 40 mg by mouth 2 (two) times daily.   polyethylene glycol 17 g packet Commonly known as: MIRALAX / GLYCOLAX Take 8.5 g by mouth daily as needed (constipation).   potassium chloride SA 20 MEQ tablet Commonly known as: KLOR-CON M Take 20 mEq by mouth 2 (two) times daily.   PreserVision AREDS 2 Caps Take 1 capsule by mouth 2 (two) times daily.   PreviDent 5000 Booster Plus 1.1 % Pste Generic drug: Sodium Fluoride Place 1 Application onto teeth See admin instructions. Brush on teeth with a toothbrush after evening mouth care. Spit out excess and do not rinse.   Rhopressa 0.02 % Soln Generic drug: Netarsudil Dimesylate Place 1 drop  into both eyes at bedtime.   rOPINIRole 0.5 MG tablet Commonly known as: REQUIP Take 0.5 mg by mouth as needed.   sertraline 50 MG tablet Commonly known as: ZOLOFT Take 1.5 tablets (75 mg total) by mouth daily.   simvastatin 20 MG tablet Commonly known as: ZOCOR Take 20 mg by mouth every evening.   vitamin C 1000 MG tablet Take 1,000 mg by mouth every morning.   Vitamin D3 50 MCG (2000 UT) Tabs Take 2,000 Units by mouth every morning.   zinc oxide 20 % ointment Apply 1 Application topically See admin instructions. Apply topically to buttocks after every incontinent episode and as needed for redness        Contact information for follow-up providers     Coletta Memos, [name], NP. Schedule an appointment as soon as possible for a visit in 2 week(s).   Specialty: Adult Health Nurse Practitioner Why:  for hospital follow-up Contact information: [address] [place] [address] [phone]         Vida Rigger, MD. Schedule an appointment as soon as possible for a visit in 2 week(s).   Specialty: Gastroenterology Why: for hospital follow-up Contact information: [address] [place] [address] [phone]              Contact information for after-discharge care     Destination     [facility] Preferred SNF .   Service: Skilled Nursing Contact information: [address] Test Update Address [place] [address] [phone]                    Discharge Exam: Ceasar Mons Weights   04/19/23 1117 04/25/23 1005  Weight: 81.6 kg 81.6 kg   S: No acute complaints, eating breakfast this morning, tolerating diet.  No chest pain, shortness of breath, fever chills nausea vomiting.  Looking forward to discharge today  BP (!) 114/56 (BP Location: Right Arm)   Pulse 72   Temp 98.1 F (36.7 C) (Oral)   Resp 16   Ht 5\' 1"  (1.549 m)   Wt 81.6 kg   SpO2 94%   BMI 33.99 kg/m   Physical Exam General: Alert and oriented x 3, NAD Cardiovascular: S1 S2 clear, RRR.  Respiratory: CTAB Gastrointestinal: Soft, nontender, nondistended, NBS Ext: no pedal edema bilaterally Neuro: no new deficits Psych: Normal affect   Condition at discharge: fair  The results of significant diagnostics from this hospitalization (including imaging, microbiology, ancillary and laboratory) are listed below for reference.   Imaging Studies: CT ANGIO GI BLEED  Result Date: 04/19/2023 CLINICAL DATA:  GI bleed.  Anemia EXAM: CTA ABDOMEN AND PELVIS WITHOUT AND WITH CONTRAST TECHNIQUE: Multidetector CT imaging of the abdomen and pelvis was performed using the standard protocol during bolus administration of intravenous contrast. Multiplanar reconstructed images and MIPs were obtained and reviewed to evaluate the vascular anatomy. RADIATION DOSE  REDUCTION: This exam was performed according to the departmental dose-optimization program which includes automated exposure control, adjustment of the mA and/or kV according to patient size and/or use of iterative reconstruction technique. CONTRAST:  OMNIPAQUE IOHEXOL 350 MG/ML SOLN COMPARISON:  05/03/2022 FINDINGS: VASCULAR Aorta: Normal caliber aorta without aneurysm, dissection, vasculitis or significant stenosis. Moderate atherosclerosis. Celiac: Patent without evidence of aneurysm, dissection, vasculitis or significant stenosis. SMA: Patent without evidence of aneurysm, dissection, vasculitis or significant stenosis. Renals: Atherosclerotic plaque at the ostia of the bilateral renal arteries demonstrates right greater than left proximal stenosis, similar  to prior. No aneurysm or evidence of fibromuscular dysplasia. IMA: Patent. Inflow: Patent without evidence of aneurysm, dissection, vasculitis or significant stenosis. Proximal Outflow: Bilateral common femoral and visualized portions of the superficial and profunda femoral arteries are patent without evidence of aneurysm, dissection, vasculitis or significant stenosis. Veins: Major venous structures of the abdomen are patent. Review of the MIP images confirms the above findings. NON-VASCULAR Lower chest: No acute abnormality. Hepatobiliary: No focal liver abnormality. Small amount of layering sludge or small stones within the gallbladder. No gallbladder wall thickening or pericholecystic inflammatory changes by CT. No biliary dilatation. Pancreas: Unremarkable. No pancreatic ductal dilatation or surrounding inflammatory changes. Spleen: Normal in size without focal abnormality. Adrenals/Urinary Tract: Unremarkable adrenal glands. Unchanged left caliceal diverticulum. Kidneys enhance symmetrically. No stone or hydronephrosis. Urinary bladder within normal limits. Stomach/Bowel: Stomach within normal limits. No abnormally dilated loops of bowel. Normal  appendix in the right lower quadrant. Severe colonic diverticulosis. No focal bowel wall thickening or inflammatory changes. No contrast accumulation within the bowel lumen is identified on multi phasic imaging to suggest active GI bleed by CT. Lymphatic: No abdominopelvic lymphadenopathy. Reproductive: Uterus and bilateral adnexa are unremarkable. Other: No free air or free fluid. 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 No acute bony abnormalities. IMPRESSION: 1. No evidence of active GI bleed by CT. If further imaging evaluation is clinically warranted, a nuclear medicine GI bleeding scan could be performed. 2. Severe colonic diverticulosis without evidence of acute diverticulitis. 3. Cholelithiasis without evidence of acute cholecystitis. 4. 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 5. Aortic atherosclerosis (ICD10-I70.0). Electronically Signed   By: Duanne Guess D.O.   On: 04/19/2023 15:46    Microbiology: Results for orders placed or performed during the hospital encounter of 04/19/23  MRSA Next Gen by PCR, Nasal     Status: None   Collection Time: 04/19/23  4:50 PM   Specimen: Nasal Mucosa; Nasal Swab  Result Value Ref Range Status   MRSA by PCR Next Gen NOT DETECTED NOT DETECTED Final    Comment: (NOTE) The GeneXpert MRSA Assay (FDA approved for NASAL specimens only), is one component of a comprehensive MRSA colonization surveillance program. It is not intended to diagnose MRSA infection nor to guide or monitor treatment for MRSA infections. Test performance is not FDA approved in patients less than 64 years old. Performed at [facility], [address] [place], [place] [address]     Labs: CBC: Recent Labs  Lab 04/24/23 0329 04/25/23 0322 04/26/23 1025 04/27/23 0629 04/29/23 0630  WBC 5.5 5.0 5.7 5.5 5.2  NEUTROABS  --   --  3.7  --   --   HGB 8.3* 7.9* 8.8* 8.4*  8.0*  HCT 26.7* 26.5* 28.8* 27.2* 25.6*  MCV 98.5 101.5* 99.3 98.6 97.3  PLT 163 173 196 197 189   Basic Metabolic Panel: Recent Labs  Lab 04/23/23 0319 04/24/23 0329 04/25/23 0322 04/27/23 0629 04/29/23 0541  NA 141 141 142 140 141  K 3.6 3.5 3.6 3.8 3.7  CL 110 109 112* 106 106  CO2 24 23 24 27 26   GLUCOSE 120* 119* 108* 120* 108*  BUN 6* <5* 6* 10 8  CREATININE 0.33* 0.45 0.44 0.48 0.52  CALCIUM 8.5* 8.3* 8.2* 8.6* 8.7*   Liver Function Tests: No results for input(s): "AST", "ALT", "ALKPHOS", "BILITOT", "PROT", "ALBUMIN" in the last 168 hours. CBG: No results for input(s): "GLUCAP" in the last 168 hours.  Discharge time spent: greater than 30 minutes.  Signed: Thad Ranger, MD [facility] 04/29/2023

## 2023-04-29 NOTE — Progress Notes (Signed)
Physical Therapy Treatment Patient Details Name: [name] MRN: [id number] DOB: 07/07/1940 Today's Date: 04/29/2023   History of Present Illness Patient is a 82 year old female who presented to the hospital with low hgb. Patient was admitted with UGI bleed.  [name], depression, chronic diastolic heart failure, osteoarthritis, glaucoma, hyperlipidemia, hypertension, pulmonary nodule, restless leg syndrome, osteoporosis, COPD, aortic arthrosclerosis, GERD, history of diverticular bleed with embolization, UGI bleed, chronic iron deficiency anemia    PT Comments  The patient ambulated  with Rw, requires guidance  due to limited vision. Patient will benefit from continued inpatient follow up therapy, <3 hours/day.   If plan is discharge home, recommend the following: A little help with walking and/or transfers;A little help with bathing/dressing/bathroom;Assistance with cooking/housework;Assist for transportation;Help with stairs or ramp for entrance   Can travel by private vehicle        Equipment Recommendations  None recommended by PT    Recommendations for Other Services       Precautions / Restrictions Precautions Precautions: Fall Precaution Comments: incontinent     Mobility  Bed Mobility               General bed mobility comments: pt in recliner    Transfers Overall transfer level: Needs assistance Equipment used: Rolling walker (2 wheels) Transfers: Sit to/from Stand Sit to Stand: Contact guard assist           General transfer comment: cues for use of armrests    Ambulation/Gait Ambulation/Gait assistance: Contact guard assist Gait Distance (Feet): 160 Feet Assistive device: Rolling walker (2 wheels) Gait Pattern/deviations: Step-through pattern, Decreased stride length, Trunk flexed       General Gait Details: verbal cues for RW positioning, slow but steady pace, distance to tolerance   Stairs             Wheelchair Mobility      Tilt Bed    Modified Rankin (Stroke Patients Only)       Balance Overall balance assessment: Mild deficits observed, not formally tested                                          Cognition Arousal: Alert Behavior During Therapy: WFL for tasks assessed/performed Overall Cognitive Status: Within Functional Limits for tasks assessed                                          Exercises      General Comments        Pertinent Vitals/Pain Pain Assessment Pain Assessment: No/denies pain    Home Living                          Prior Function            PT Goals (current goals can now be found in the care plan section) Progress towards PT goals: Progressing toward goals    Frequency    Min 1X/week      PT Plan      Co-evaluation              AM-PAC PT "6 Clicks" Mobility   Outcome Measure  Help needed turning from your back to your side while in a flat bed without using bedrails?: A  Little Help needed moving from lying on your back to sitting on the side of a flat bed without using bedrails?: A Little Help needed moving to and from a bed to a chair (including a wheelchair)?: A Little Help needed standing up from a chair using your arms (e.g., wheelchair or bedside chair)?: A Little Help needed to walk in hospital room?: A Little Help needed climbing 3-5 steps with a railing? : A Lot 6 Click Score: 17    End of Session Equipment Utilized During Treatment: Gait belt Activity Tolerance: Patient tolerated treatment well Patient left: in chair;with call bell/phone within reach   PT Visit Diagnosis: Difficulty in walking, not elsewhere classified (R26.2);Muscle weakness (generalized) (M62.81)     Time: 6295-2841 PT Time Calculation (min) (ACUTE ONLY): 14 min  Charges:    $Gait Training: 8-22 mins PT General Charges $$ ACUTE PT VISIT: 1 Visit                     Blanchard Kelch PT Acute Rehabilitation  Services Office [phone] Weekend pager-[phone]    Rada Hay 04/29/2023, 1:33 PM

## 2023-04-30 ENCOUNTER — Ambulatory Visit (HOSPITAL_BASED_OUTPATIENT_CLINIC_OR_DEPARTMENT_OTHER): Payer: Medicare HMO

## 2023-04-30 ENCOUNTER — Encounter: Payer: Self-pay | Admitting: Orthopedic Surgery

## 2023-04-30 ENCOUNTER — Non-Acute Institutional Stay (SKILLED_NURSING_FACILITY): Payer: Medicare HMO | Admitting: Orthopedic Surgery

## 2023-04-30 ENCOUNTER — Other Ambulatory Visit: Payer: Self-pay | Admitting: Orthopedic Surgery

## 2023-04-30 DIAGNOSIS — R911 Solitary pulmonary nodule: Secondary | ICD-10-CM

## 2023-04-30 DIAGNOSIS — M6281 Muscle weakness (generalized): Secondary | ICD-10-CM | POA: Diagnosis not present

## 2023-04-30 DIAGNOSIS — J410 Simple chronic bronchitis: Secondary | ICD-10-CM

## 2023-04-30 DIAGNOSIS — K922 Gastrointestinal hemorrhage, unspecified: Secondary | ICD-10-CM

## 2023-04-30 DIAGNOSIS — G2581 Restless legs syndrome: Secondary | ICD-10-CM

## 2023-04-30 DIAGNOSIS — I10 Essential (primary) hypertension: Secondary | ICD-10-CM

## 2023-04-30 DIAGNOSIS — R41841 Cognitive communication deficit: Secondary | ICD-10-CM | POA: Diagnosis not present

## 2023-04-30 DIAGNOSIS — Z9181 History of falling: Secondary | ICD-10-CM | POA: Diagnosis not present

## 2023-04-30 DIAGNOSIS — E782 Mixed hyperlipidemia: Secondary | ICD-10-CM | POA: Diagnosis not present

## 2023-04-30 DIAGNOSIS — F339 Major depressive disorder, recurrent, unspecified: Secondary | ICD-10-CM

## 2023-04-30 DIAGNOSIS — R1312 Dysphagia, oropharyngeal phase: Secondary | ICD-10-CM | POA: Diagnosis not present

## 2023-04-30 DIAGNOSIS — R531 Weakness: Secondary | ICD-10-CM | POA: Diagnosis not present

## 2023-04-30 DIAGNOSIS — R2681 Unsteadiness on feet: Secondary | ICD-10-CM | POA: Diagnosis not present

## 2023-04-30 LAB — SURGICAL PATHOLOGY

## 2023-04-30 MED ORDER — ROPINIROLE HCL 0.5 MG PO TABS: 0.5000 mg | ORAL_TABLET | Freq: Every day | ORAL | 0 refills | Status: AC | PRN

## 2023-04-30 NOTE — Progress Notes (Signed)
Location:  [facility] Room Number: 15/A Place of Service:  SNF (31) Provider:  Octavia Heir, NP   Octavia Heir, NP  Patient Care Team: Octavia Heir, NP as PCP - General (Adult Health Nurse Practitioner) [name], MD as Consulting Physician (Internal Medicine)  Extended Emergency Contact Information Primary Emergency Contact: Kama,[name] Address: [address]          [place], [place] [address] [name] of [place] Home Phone: [phone] Mobile Phone: [phone] Relation: Spouse Secondary Emergency Contact: Grow MD,[name] Jethro Poling Phone: [phone] Relation: Son Interpreter needed? No  Code Status:  DNR Goals of care: Advanced Directive information    04/25/2023   10:05 AM  Advanced Directives  Does Patient Have a Medical Advance Directive? No     Chief Complaint  Patient presents with   Hospitalization Follow-up    HPI:  Pt is a 82 y.o. female seen today for f/u s/p hospitalization 10/19-10/29.   She currently resides on the assisted living unit at [facility]. PMH: HTN, COPD, pulmonary nodule, Diverticulosis with GI bleed, GERD, OA, osteoporosis,macular degeneration, iron def anemia, unstable gait, anxiety, and depression.   "H/o diverticular bleed 11/2020 s/p embolization with recurrence. Hospitalized 05/2022 and 07/07 due to bloody stools. Colonoscopy 2023 noted blood in colon and diffuse diverticulosis. Past EGD noted gastric polyps, duodenal bulb and gastritis."   09/30 hospitalized due to bloody stools. Hgb was noted to be 10.5. GI recommended conservative management. Remains on Protonix 40 mg po BID. "  10/16 recurrence of bloody stools. Hgb 10.1 04/17/2023. Hgb dropped < 6 04/19/2023 and she was sent to ED for evaluation. ED labs noted white count 7.9, hemoglobin 5.9, MCV 103.8 and platelets 554. PT 16.2/ INR 1.3. FOBT positive. Other labs unremarkable. CTA negative for active GI bleed, pulmonary nodule was  present. GI consulted and she was given 2 units PRBC. 10/21 EGD showed acute gastritis and gastric polyps. 10/22 capsule endoscopy showed focal area of blood in proximal colon. 10/25 colonoscopy showed moderate proximal colonic ischemic mucosa, biopsies taken. She was also hypotensive during most of stay and metoprolol was discontinued.  Due to ongoing weakness, SNF was recommended by PT/OT. She was admitted to [facility] due to bed availability. Plan to transfer to [facility] if bed becomes available. She would like to discharge back to AL if she can.   Today, she is happy to be back at [facility]. Denies bloody stools, dizziness or lightheadedness. She c/o ongoing fatigue, unchanged from hospitalization. Also upset her eyesight has worsened. She is now using magnified goggles over her glasses to read. No abdominal pain. Appetite fair. Afebrile. Vitals stable.   Nursing plans to schedule f/u with GI in 2 weeks.      Past Medical History:  Diagnosis Date   Anxiety    Arthritis    "bilateral knees, shoulders, elbows; neck, pretty widespread" (09/05/2017)   BPPV (benign paroxysmal positional vertigo)    Depression    GERD (gastroesophageal reflux disease)    Glaucoma, both eyes    Headache    "probably 2/month" (09/05/2017)   History of blood transfusion ~ 2008   "related to LGIB"   Hyperlipemia    Lower GI bleeding ~ 2008; 09/05/2017   "had to have blood transfusion"   Macular degeneration, bilateral    Osteopenia    Seasonal allergies    Skin cancer, basal cell 2001   "off my nose, left side"   Sleeping excessive  Tinnitus of both ears    Past Surgical History:  Procedure Laterality Date   BALLOON DILATION N/A 05/08/2021   Procedure: BALLOON DILATION;  Surgeon: Kerin Salen, MD;  Location: [facility] ENDOSCOPY;  Service: Gastroenterology;  Laterality: N/A;   BASAL CELL CARCINOMA EXCISION  2001   "off my nose, left side"   BIOPSY  05/08/2021   Procedure: BIOPSY;  Surgeon: Kerin Salen, MD;  Location: [facility] ENDOSCOPY;   Service: Gastroenterology;;   BIOPSY  04/25/2023   Procedure: BIOPSY;  Surgeon: Charlott Rakes, MD;  Location: [facility] ENDOSCOPY;  Service: Gastroenterology;;   BLEPHAROPLASTY Bilateral    CATARACT EXTRACTION W/ INTRAOCULAR LENS  IMPLANT, BILATERAL Bilateral 1990's   COLONOSCOPY WITH PROPOFOL N/A 05/04/2022   Procedure: COLONOSCOPY WITH PROPOFOL;  Surgeon: Kerin Salen, MD;  Location: [facility] ENDOSCOPY;  Service: Gastroenterology;  Laterality: N/A;   COLONOSCOPY WITH PROPOFOL N/A 04/25/2023   Procedure: COLONOSCOPY WITH PROPOFOL;  Surgeon: Charlott Rakes, MD;  Location: [facility] ENDOSCOPY;  Service: Gastroenterology;  Laterality: N/A;   ESOPHAGOGASTRODUODENOSCOPY (EGD) WITH PROPOFOL N/A 05/08/2021   Procedure: ESOPHAGOGASTRODUODENOSCOPY (EGD) WITH PROPOFOL;  Surgeon: Kerin Salen, MD;  Location: [facility] ENDOSCOPY;  Service: Gastroenterology;  Laterality: N/A;   ESOPHAGOGASTRODUODENOSCOPY (EGD) WITH PROPOFOL N/A 01/11/2022   Procedure: ESOPHAGOGASTRODUODENOSCOPY (EGD) WITH PROPOFOL;  Surgeon: Vida Rigger, MD;  Location: [facility] ENDOSCOPY;  Service: Gastroenterology;  Laterality: N/A;   ESOPHAGOGASTRODUODENOSCOPY (EGD) WITH PROPOFOL N/A 01/06/2023   Procedure: ESOPHAGOGASTRODUODENOSCOPY (EGD) WITH PROPOFOL;  Surgeon: Vida Rigger, MD;  Location: [facility] ENDOSCOPY;  Service: Gastroenterology;  Laterality: N/A;   ESOPHAGOGASTRODUODENOSCOPY (EGD) WITH PROPOFOL N/A 04/21/2023   Procedure: ESOPHAGOGASTRODUODENOSCOPY (EGD) WITH PROPOFOL;  Surgeon: Charlott Rakes, MD;  Location: [facility] ENDOSCOPY;  Service: Gastroenterology;  Laterality: N/A;   EYE SURGERY Bilateral    "to improve vision after cataract OR"   GIVENS CAPSULE STUDY N/A 04/21/2023   Procedure: GIVENS CAPSULE STUDY;  Surgeon: Charlott Rakes, MD;  Location: [facility] ENDOSCOPY;  Service: Gastroenterology;  Laterality: N/A;   IR ANGIOGRAM FOLLOW UP STUDY  12/19/2020   IR ANGIOGRAM SELECTIVE EACH ADDITIONAL VESSEL  12/19/2020   IR ANGIOGRAM SELECTIVE EACH ADDITIONAL VESSEL  12/19/2020    IR ANGIOGRAM VISCERAL SELECTIVE  12/19/2020   IR EMBO ART  VEN HEMORR LYMPH EXTRAV  INC GUIDE ROADMAPPING  12/19/2020   IR US GUIDE VASC ACCESS RIGHT  12/19/2020   JOINT REPLACEMENT     POLYPECTOMY  05/04/2022   Procedure: POLYPECTOMY;  Surgeon: Kerin Salen, MD;  Location: [facility] ENDOSCOPY;  Service: Gastroenterology;;   STAPEDES SURGERY Left    "scraped stapedes because it was sticking when it wasn't suppose to"   TONSILLECTOMY AND ADENOIDECTOMY  1946   TOTAL KNEE ARTHROPLASTY Left ~ 2008   TOTAL KNEE ARTHROPLASTY  12/20/2011   Procedure: TOTAL KNEE ARTHROPLASTY;  Surgeon: Verlee Rossetti, MD;  Location[facility];  Service: Orthopedics;  Laterality: Right;  Right Total Knee Arthroplasty   TUBAL LIGATION  1980's    Allergies  Allergen Reactions   Morphine And Codeine Itching   Aspirin Other (See Comments)    Unknown reaction   Duloxetine Hcl Other (See Comments)    Unknown reaction   Tymlos [Abaloparatide] Other (See Comments)    Unknown reaction   Ventolin [Albuterol] Other (See Comments)    rapid heart beat   Cefadroxil Hives    Patient can take amoxicillin and cipro    Outpatient Encounter Medications as of 04/30/2023  Medication Sig   acetaminophen (TYLENOL) 500 MG tablet Take 1,000 mg by mouth every 8 (eight) hours  as needed for moderate pain (pain score 4-6).   acetaminophen (TYLENOL) 650 MG CR tablet Take 1,300 mg by mouth at bedtime.   albuterol (VENTOLIN HFA) 108 (90 Base) MCG/ACT inhaler Inhale 2 puffs into the lungs every 4 (four) hours as needed for wheezing or shortness of breath.   alendronate (FOSAMAX) 70 MG tablet Take 70 mg by mouth every Sunday. Take with a full glass of water on an empty stomach.   Ascorbic Acid (VITAMIN C) 1000 MG tablet Take 1,000 mg by mouth every morning.   cetirizine (ZYRTEC) 5 MG chewable tablet Chew 5 mg by mouth daily as needed for allergies.   Cholecalciferol (VITAMIN D3) 50 MCG (2000 UT) TABS Take 2,000 Units by mouth every morning.    docusate sodium (COLACE) 100 MG capsule Take 200 mg by mouth at bedtime.   dorzolamide-timolol (COSOPT) 22.3-6.8 MG/ML ophthalmic solution Place 1 drop into both eyes 2 (two) times daily.   ferrous gluconate (FERGON) 240 (27 FE) MG tablet Take 240 mg by mouth every morning. Take without food   fluticasone (FLONASE) 50 MCG/ACT nasal spray Place 1 spray into both nostrils daily as needed for allergies or rhinitis.   guaiFENesin (ROBITUSSIN) 100 MG/5ML liquid Take 10 mLs by mouth every 4 (four) hours as needed for cough or to loosen phlegm.   latanoprost (XALATAN) 0.005 % ophthalmic solution Place 1 drop into both eyes at bedtime.   LIDOCAINE EX Apply 1 application  topically in the morning and at bedtime. Right shoulder to elbow   LIDOCAINE EX Apply 1 Application topically daily. Right Ankle.   Multiple Vitamins-Minerals (PRESERVISION AREDS 2) CAPS Take 1 capsule by mouth 2 (two) times daily.   Netarsudil Dimesylate (RHOPRESSA) 0.02 % SOLN Place 1 drop into both eyes at bedtime.   oxybutynin (DITROPAN-XL) 5 MG 24 hr tablet Take 5 mg by mouth at bedtime.   pantoprazole (PROTONIX) 40 MG tablet Take 40 mg by mouth 2 (two) times daily.   polyethylene glycol (MIRALAX / GLYCOLAX) 17 g packet Take 8.5 g by mouth daily as needed (constipation).   potassium chloride SA (KLOR-CON M) 20 MEQ tablet Take 20 mEq by mouth 2 (two) times daily. (Patient not taking: Reported on 04/19/2023)   Probiotic Product (ALIGN) 4 MG CAPS Take 4 mg by mouth at bedtime.   rOPINIRole (REQUIP) 0.5 MG tablet Take 1 tablet (0.5 mg total) by mouth daily as needed.   sertraline (ZOLOFT) 50 MG tablet Take 1.5 tablets (75 mg total) by mouth daily.   simvastatin (ZOCOR) 20 MG tablet Take 20 mg by mouth every evening.   Sodium Fluoride (PREVIDENT 5000 BOOSTER PLUS) 1.1 % PSTE Place 1 Application onto teeth See admin instructions. Brush on teeth with a toothbrush after evening mouth care. Spit out excess and do not rinse.    umeclidinium-vilanterol (ANORO ELLIPTA) 62.5-25 MCG/ACT AEPB Inhale 1 puff into the lungs every morning.   zinc oxide 20 % ointment Apply 1 Application topically See admin instructions. Apply topically to buttocks after every incontinent episode and as needed for redness   No facility-administered encounter medications on file as of 04/30/2023.    Review of Systems  Constitutional:  Positive for fatigue. Negative for activity change and appetite change.  HENT:  Negative for sore throat and trouble swallowing.   Eyes:  Negative for visual disturbance.  Respiratory:  Negative for cough, shortness of breath and wheezing.   Gastrointestinal:  Positive for blood in stool. Negative for abdominal distention, abdominal pain, constipation, diarrhea  and rectal pain.  Genitourinary:  Negative for dysuria, frequency and hematuria.  Musculoskeletal:  Positive for gait problem.  Skin:  Negative for wound.  Neurological:  Positive for weakness. Negative for dizziness, light-headedness and headaches.  Psychiatric/Behavioral:  Positive for dysphoric mood. Negative for confusion and sleep disturbance. The patient is nervous/anxious.     Immunization History  Administered Date(s) Administered   Fluad Quad(high Dose 65+) 04/24/2022   Fluad Trivalent(High Dose 65+) 04/27/2023   Influenza, High Dose Seasonal PF 03/14/2018   PFIZER(Purple Top)SARS-COV-2 Vaccination 07/25/2019, 08/16/2019, 12/12/2021   Pneumococcal-Unspecified 03/02/2011   Tdap 03/12/2018   Zoster Recombinant(Shingrix) 10/08/2017, 12/09/2017   Pertinent  Health Maintenance Due  Topic Date Due   DEXA SCAN  Never done   INFLUENZA VACCINE  Completed      05/17/2022   10:19 AM 07/12/2022   11:08 AM 10/30/2022   12:44 PM 01/22/2023    1:15 PM 04/04/2023   11:00 AM  Fall Risk  Falls in the past year? 1 0 0 0 0  Was there an injury with Fall? 1 0 0 0 0  Fall Risk Category Calculator 2 0 0 0 0  Fall Risk Category (Retired) Moderate Low      (RETIRED) Patient Fall Risk Level Moderate fall risk Low fall risk     Patient at Risk for Falls Due to History of fall(s) History of fall(s) History of fall(s);Impaired balance/gait;Impaired mobility History of fall(s);Impaired balance/gait History of fall(s);Impaired balance/gait;Impaired mobility  Fall risk Follow up Falls evaluation completed Falls evaluation completed Falls evaluation completed;Education provided;Falls prevention discussed Falls evaluation completed;Education provided;Falls prevention discussed Falls evaluation completed;Education provided;Falls prevention discussed   Functional Status Survey:    Vitals:   04/30/23 0929  BP: 108/61  Pulse: 77  Resp: 20  Temp: 97.9 F (36.6 C)  SpO2: 94%  Weight: 170 lb (77.1 kg)  Height: 5\' 1"  (1.549 m)   Body mass index is 32.12 kg/m. Physical Exam Vitals reviewed.  Constitutional:      General: She is not in acute distress. HENT:     Head: Normocephalic.     Right Ear: There is no impacted cerumen.     Left Ear: There is no impacted cerumen.     Nose: Nose normal.     Mouth/Throat:     Mouth: Mucous membranes are moist.  Eyes:     General:        Right eye: No discharge.        Left eye: No discharge.  Cardiovascular:     Rate and Rhythm: Normal rate and regular rhythm.     Pulses: Normal pulses.     Heart sounds: Normal heart sounds.  Pulmonary:     Effort: Pulmonary effort is normal. No respiratory distress.     Breath sounds: Normal breath sounds. No wheezing.  Abdominal:     General: Bowel sounds are normal. There is no distension.     Palpations: Abdomen is soft.     Tenderness: There is no abdominal tenderness.  Musculoskeletal:     Cervical back: Neck supple.     Right lower leg: No edema.     Left lower leg: No edema.  Lymphadenopathy:     Cervical: No cervical adenopathy.  Skin:    General: Skin is warm.     Capillary Refill: Capillary refill takes less than 2 seconds.  Neurological:      General: No focal deficit present.     Mental Status: She is alert and  oriented to person, place, and time.     Motor: Weakness present.     Gait: Gait abnormal.     Comments: Walker, 1+ assist  Psychiatric:        Mood and Affect: Mood normal.     Labs reviewed: Recent Labs    05/05/22 0008 05/05/22 0631 04/25/23 0322 04/27/23 0629 04/29/23 0541  NA  --    < > 142 140 141  K  --    < > 3.6 3.8 3.7  CL  --    < > 112* 106 106  CO2  --    < > 24 27 26   GLUCOSE  --    < > 108* 120* 108*  BUN  --    < > 6* 10 8  CREATININE  --    < > 0.44 0.48 0.52  CALCIUM  --    < > 8.2* 8.6* 8.7*  MG 1.9  --   --   --   --    < > = values in this interval not displayed.   Recent Labs    03/29/23 0843 04/03/23 0000 04/19/23 1144 04/20/23 0739  AST 16 14 16 16   ALT 10 9 9 9   ALKPHOS 40 48 31* 27*  BILITOT 0.6  --  0.6 1.1  PROT 6.7  --  5.8* 5.0*  ALBUMIN 3.8 4.2 3.4* 3.0*   Recent Labs    04/03/23 0000 04/19/23 1144 04/19/23 1227 04/26/23 1025 04/27/23 0629 04/29/23 0630  WBC 5.7 7.9   < > 5.7 5.5 5.2  NEUTROABS 33,335.00 5.3  --  3.7  --   --   HGB 10.8* 5.9*   < > 8.8* 8.4* 8.0*  HCT 34* 19.2*   < > 28.8* 27.2* 25.6*  MCV  --  103.8*   < > 99.3 98.6 97.3  PLT 195 154   < > 196 197 189   < > = values in this interval not displayed.   Lab Results  Component Value Date   TSH 2.47 10/08/2022   No results found for: "HGBA1C" Lab Results  Component Value Date   CHOL 147 02/18/2022   HDL 51 02/18/2022   LDLCALC 72 02/18/2022   TRIG 162 (A) 02/18/2022    Significant Diagnostic Results in last 30 days:  CT ANGIO GI BLEED  Result Date: 04/19/2023 CLINICAL DATA:  GI bleed.  Anemia EXAM: CTA ABDOMEN AND PELVIS WITHOUT AND WITH CONTRAST TECHNIQUE: Multidetector CT imaging of the abdomen and pelvis was performed using the standard protocol during bolus administration of intravenous contrast. Multiplanar reconstructed images and MIPs were obtained and reviewed to evaluate  the vascular anatomy. RADIATION DOSE REDUCTION: This exam was performed according to the departmental dose-optimization program which includes automated exposure control, adjustment of the mA and/or kV according to patient size and/or use of iterative reconstruction technique. CONTRAST:  OMNIPAQUE IOHEXOL 350 MG/ML SOLN COMPARISON:  05/03/2022 FINDINGS: VASCULAR Aorta: Normal caliber aorta without aneurysm, dissection, vasculitis or significant stenosis. Moderate atherosclerosis. Celiac: Patent without evidence of aneurysm, dissection, vasculitis or significant stenosis. SMA: Patent without evidence of aneurysm, dissection, vasculitis or significant stenosis. Renals: Atherosclerotic plaque at the ostia of the bilateral renal arteries demonstrates right greater than left proximal stenosis, similar to prior. No aneurysm or evidence of fibromuscular dysplasia. IMA: Patent. Inflow: Patent without evidence of aneurysm, dissection, vasculitis or significant stenosis. Proximal Outflow: Bilateral common femoral and visualized portions of the superficial and profunda femoral arteries are patent without  evidence of aneurysm, dissection, vasculitis or significant stenosis. Veins: Major venous structures of the abdomen are patent. Review of the MIP images confirms the above findings. NON-VASCULAR Lower chest: No acute abnormality. Hepatobiliary: No focal liver abnormality. Small amount of layering sludge or small stones within the gallbladder. No gallbladder wall thickening or pericholecystic inflammatory changes by CT. No biliary dilatation. Pancreas: Unremarkable. No pancreatic ductal dilatation or surrounding inflammatory changes. Spleen: Normal in size without focal abnormality. Adrenals/Urinary Tract: Unremarkable adrenal glands. Unchanged left caliceal diverticulum. Kidneys enhance symmetrically. No stone or hydronephrosis. Urinary bladder within normal limits. Stomach/Bowel: Stomach within normal limits. No  abnormally dilated loops of bowel. Normal appendix in the right lower quadrant. Severe colonic diverticulosis. No focal bowel wall thickening or inflammatory changes. No contrast accumulation within the bowel lumen is identified on multi phasic imaging to suggest active GI bleed by CT. Lymphatic: No abdominopelvic lymphadenopathy. Reproductive: Uterus and bilateral adnexa are unremarkable. Other: No free air or free fluid. 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 No acute bony abnormalities. IMPRESSION: 1. No evidence of active GI bleed by CT. If further imaging evaluation is clinically warranted, a nuclear medicine GI bleeding scan could be performed. 2. Severe colonic diverticulosis without evidence of acute diverticulitis. 3. Cholelithiasis without evidence of acute cholecystitis. 4. 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 5. Aortic atherosclerosis (ICD10-I70.0). Electronically Signed   By: Duanne Guess D.O.   On: 04/19/2023 15:46    Assessment/Plan 1. Gastrointestinal hemorrhage, unspecified gastrointestinal hemorrhage type - hospitalized 10/19- 10/29 - hgb 5.9, FOBT + - CTA no active GI bleed - 10/21 EGD showed acute gastritis and gastric polyps - 10/22 capsule endoscopy showed focal area of blood in proximal colon - 10/25 colonoscopy showed moderate proximal colonic ischemic mucosa, biopsies taken> pending result - needs f/u with GI in 2 weeks   2. Essential hypertension - hypotensive during hospitalization - metoprolol discontinued - not on medication - BP TID x 1 weeks  3. Mixed hyperlipidemia - cont simvastatin  4. Recurrent depression (HCC) - no mood changes - cont Sertraline  5. Simple chronic bronchitis (HCC) - ongoing - no exacerbations - cont anoro ellipta  6. Pulmonary nodule 1 cm or greater in diameter - noted during hospitalization - not listed on CTA 10/192024 - ? Repeat  CXR  7. Generalized weakness - 10/29 admitted to SNF - awaiting bed at [facility] - would like to just discharge back to AL - ambulating well with PT - high risk for falls due to anemia and poor eyesight - recommend she discharge back to her room ASAP for falls prevention    Family/ staff Communication: plan discussed with patient and nurse  Labs/tests ordered:  none

## 2023-05-01 ENCOUNTER — Non-Acute Institutional Stay (SKILLED_NURSING_FACILITY): Payer: Medicare HMO | Admitting: Internal Medicine

## 2023-05-01 DIAGNOSIS — M6281 Muscle weakness (generalized): Secondary | ICD-10-CM | POA: Diagnosis not present

## 2023-05-01 DIAGNOSIS — R2681 Unsteadiness on feet: Secondary | ICD-10-CM | POA: Diagnosis not present

## 2023-05-01 DIAGNOSIS — R531 Weakness: Secondary | ICD-10-CM | POA: Diagnosis not present

## 2023-05-01 DIAGNOSIS — E782 Mixed hyperlipidemia: Secondary | ICD-10-CM

## 2023-05-01 DIAGNOSIS — Z9181 History of falling: Secondary | ICD-10-CM | POA: Diagnosis not present

## 2023-05-01 DIAGNOSIS — G2581 Restless legs syndrome: Secondary | ICD-10-CM

## 2023-05-01 DIAGNOSIS — K922 Gastrointestinal hemorrhage, unspecified: Secondary | ICD-10-CM | POA: Diagnosis not present

## 2023-05-01 DIAGNOSIS — F339 Major depressive disorder, recurrent, unspecified: Secondary | ICD-10-CM

## 2023-05-01 DIAGNOSIS — R41841 Cognitive communication deficit: Secondary | ICD-10-CM | POA: Diagnosis not present

## 2023-05-01 DIAGNOSIS — I10 Essential (primary) hypertension: Secondary | ICD-10-CM | POA: Diagnosis not present

## 2023-05-01 DIAGNOSIS — J410 Simple chronic bronchitis: Secondary | ICD-10-CM

## 2023-05-01 DIAGNOSIS — R1312 Dysphagia, oropharyngeal phase: Secondary | ICD-10-CM | POA: Diagnosis not present

## 2023-05-01 NOTE — Progress Notes (Signed)
Provider:   Location:  [facility] curator of Service:  SNF ([id number])  PCP: Octavia Heir, NP Patient Care Team: Octavia Heir, NP as PCP - General (Adult Health Nurse Practitioner) [name], MD as Consulting Physician (Internal Medicine)  Extended Emergency Contact Information Primary Emergency Contact: Koslow,[name] Address: [address]          [place], [place] [address] [name] of [place] Home Phone: [phone] Mobile Phone: [phone] Relation: Spouse Secondary Emergency Contact: Gabrys MD,[name] Jethro Poling Phone: [phone] Relation: Son Interpreter needed? No  Code Status:  Goals of Care: Advanced Directive information    04/25/2023   10:05 AM  Advanced Directives  Does Patient Have a Medical Advance Directive? No      Chief Complaint  Patient presents with   New Admit To SNF    HPI: Patient is a 82 y.o. female seen today for admission to SNF for therapy  Patient lives in [place] and [facility] She was sent to the hospital due to GI bleeding and hemoglobin dropped to 5.9 from 10.8 She was admitted in the hospital from 10/19 to 10/29  Patient has a history of recurrent GI bleed with multiple workup, COPD on nocturnal oxygen she is former smoker, history of pulmonary nodules, hypertension, osteoporosis, urge incontinence  Patient was again sent to the hospital due to GI bleed and symptomatic anemia. She had EGD which showed acute gastritis.   Capsule endoscopy which showed focal area of red blood in proximal colon.  S/p colonoscopy with 2-day bowel prep showed mild to moderate proximal colonic ischemic looking mucosa. The biopsy results are back and it shows collagenous colitis. Her hemoglobin stabilized and her bleeding stopped and patient was sent back to the rehab for therapy  Patient is doing well she is already walking with mild assist.  She has not had any more blood in stools.  Denies any tachycardia or dizziness.  She wants to go back to her AL  room next week. Has lost weight  Past Medical History:  Diagnosis Date   Anxiety    Arthritis    "bilateral knees, shoulders, elbows; neck, pretty widespread" (09/05/2017)   BPPV (benign paroxysmal positional vertigo)    Depression    GERD (gastroesophageal reflux disease)    Glaucoma, both eyes    Headache    "probably 2/month" (09/05/2017)   History of blood transfusion ~ 2008   "related to LGIB"   Hyperlipemia    Lower GI bleeding ~ 2008; 09/05/2017   "had to have blood transfusion"   Macular degeneration, bilateral    Osteopenia    Seasonal allergies    Skin cancer, basal cell 2001   "off my nose, left side"   Sleeping excessive    Tinnitus of both ears    Past Surgical History:  Procedure Laterality Date   BALLOON DILATION N/A 05/08/2021   Procedure: BALLOON DILATION;  Surgeon: Kerin Salen, MD;  Location: [facility] ENDOSCOPY;  Service: Gastroenterology;  Laterality: N/A;   BASAL CELL CARCINOMA EXCISION  2001   "off my nose, left side"   BIOPSY  05/08/2021   Procedure: BIOPSY;  Surgeon: Kerin Salen, MD;  Location: [facility] ENDOSCOPY;  Service: Gastroenterology;;   BIOPSY  04/25/2023   Procedure: BIOPSY;  Surgeon: Charlott Rakes, MD;  Location: [facility] ENDOSCOPY;  Service: Gastroenterology;;   BLEPHAROPLASTY Bilateral    CATARACT EXTRACTION W/ INTRAOCULAR LENS  IMPLANT, BILATERAL Bilateral 1990's   COLONOSCOPY WITH PROPOFOL N/A 05/04/2022   Procedure: COLONOSCOPY WITH  PROPOFOL;  Surgeon: Kerin Salen, MD;  Location: [facility] ENDOSCOPY;  Service: Gastroenterology;  Laterality: N/A;   COLONOSCOPY WITH PROPOFOL N/A 04/25/2023   Procedure: COLONOSCOPY WITH PROPOFOL;  Surgeon: Charlott Rakes, MD;  Location: [facility] ENDOSCOPY;  Service: Gastroenterology;  Laterality: N/A;   ESOPHAGOGASTRODUODENOSCOPY (EGD) WITH PROPOFOL N/A 05/08/2021   Procedure: ESOPHAGOGASTRODUODENOSCOPY (EGD) WITH PROPOFOL;  Surgeon: Kerin Salen, MD;  Location: [facility] ENDOSCOPY;  Service: Gastroenterology;  Laterality: N/A;    ESOPHAGOGASTRODUODENOSCOPY (EGD) WITH PROPOFOL N/A 01/11/2022   Procedure: ESOPHAGOGASTRODUODENOSCOPY (EGD) WITH PROPOFOL;  Surgeon: Vida Rigger, MD;  Location: [facility] ENDOSCOPY;  Service: Gastroenterology;  Laterality: N/A;   ESOPHAGOGASTRODUODENOSCOPY (EGD) WITH PROPOFOL N/A 01/06/2023   Procedure: ESOPHAGOGASTRODUODENOSCOPY (EGD) WITH PROPOFOL;  Surgeon: Vida Rigger, MD;  Location: [facility] ENDOSCOPY;  Service: Gastroenterology;  Laterality: N/A;   ESOPHAGOGASTRODUODENOSCOPY (EGD) WITH PROPOFOL N/A 04/21/2023   Procedure: ESOPHAGOGASTRODUODENOSCOPY (EGD) WITH PROPOFOL;  Surgeon: Charlott Rakes, MD;  Location: [facility] ENDOSCOPY;  Service: Gastroenterology;  Laterality: N/A;   EYE SURGERY Bilateral    "to improve vision after cataract OR"   GIVENS CAPSULE STUDY N/A 04/21/2023   Procedure: GIVENS CAPSULE STUDY;  Surgeon: Charlott Rakes, MD;  Location: [facility] ENDOSCOPY;  Service: Gastroenterology;  Laterality: N/A;   IR ANGIOGRAM FOLLOW UP STUDY  12/19/2020   IR ANGIOGRAM SELECTIVE EACH ADDITIONAL VESSEL  12/19/2020   IR ANGIOGRAM SELECTIVE EACH ADDITIONAL VESSEL  12/19/2020   IR ANGIOGRAM VISCERAL SELECTIVE  12/19/2020   IR EMBO ART  VEN HEMORR LYMPH EXTRAV  INC GUIDE ROADMAPPING  12/19/2020   IR US GUIDE VASC ACCESS RIGHT  12/19/2020   JOINT REPLACEMENT     POLYPECTOMY  05/04/2022   Procedure: POLYPECTOMY;  Surgeon: Kerin Salen, MD;  Location: [facility] ENDOSCOPY;  Service: Gastroenterology;;   STAPEDES SURGERY Left    "scraped stapedes because it was sticking when it wasn't suppose to"   TONSILLECTOMY AND ADENOIDECTOMY  1946   TOTAL KNEE ARTHROPLASTY Left ~ 2008   TOTAL KNEE ARTHROPLASTY  12/20/2011   Procedure: TOTAL KNEE ARTHROPLASTY;  Surgeon: Verlee Rossetti, MD;  Location: [facility] OR;  Service: Orthopedics;  Laterality: Right;  Right Total Knee Arthroplasty   TUBAL LIGATION  1980's    reports that she quit smoking about 30 years ago. Her smoking use included cigarettes. She started smoking about 67 years ago. She has  a 27.8 pack-year smoking history. She has never used smokeless tobacco. She reports current alcohol use of about 7.0 standard drinks of alcohol per week. She reports that she does not use drugs. Social History   Socioeconomic History   Marital status: Married    Spouse name: Rosanne Ashing    Number of children: 3   Years of education: College   Highest education level: Not on file  Occupational History   Occupation: Retired Runner, broadcasting/film/video  Tobacco Use   Smoking status: Former    Current packs/day: 0.00    Average packs/day: 0.8 packs/day for 37.0 years (27.8 ttl pk-yrs)    Types: Cigarettes    Start date: 07/02/1955    Quit date: 07/01/1992    Years since quitting: 30.8   Smokeless tobacco: Never  Vaping Use   Vaping status: Never Used  Substance and Sexual Activity   Alcohol use: Yes    Alcohol/week: 7.0 standard drinks of alcohol    Types: 7 Glasses of wine per week    Comment: 09/05/2017  "6 oz wine, 5 days/wk"   Drug use: No    Types: Hydrocodone   Sexual activity: Not Currently  Other  Topics Concern   Not on file  Social History Narrative   1-2 cups of coffee a day    Social Determinants of Health   Financial Resource Strain: Low Risk  (04/04/2023)   Overall Financial Resource Strain (CARDIA)    Difficulty of Paying Living Expenses: Not hard at all  Food Insecurity: No Food Insecurity (04/22/2023)   Hunger Vital Sign    Worried About Running Out of Food in the Last Year: Never true    Ran Out of Food in the Last Year: Never true  Transportation Needs: No Transportation Needs (04/22/2023)   PRAPARE - Administrator, Civil Service (Medical): No    Lack of Transportation (Non-Medical): No  Physical Activity: Insufficiently Active (04/04/2023)   Exercise Vital Sign    Days of Exercise per Week: 2 days    Minutes of Exercise per Session: 20 min  Stress: No Stress Concern Present (04/04/2023)   [employer] of Occupational Health - Occupational Stress Questionnaire     Feeling of Stress : Only a little  Social Connections: Moderately Isolated (04/04/2023)   Social Connection and Isolation Panel [NHANES]    Frequency of Communication with Friends and Family: More than three times a week    Frequency of Social Gatherings with Friends and Family: Twice a week    Attends Religious Services: Never    Database administrator or Organizations: No    Attends Banker Meetings: Never    Marital Status: Married  Catering manager Violence: Not At Risk (04/22/2023)   Humiliation, Afraid, Rape, and Kick questionnaire    Fear of Current or Ex-Partner: No    Emotionally Abused: No    Physically Abused: No    Sexually Abused: No    Functional Status Survey:    Family History  Problem Relation Age of Onset   Heart attack Mother    Heart disease Father    Heart failure Father    Bladder Cancer Father    Supraventricular tachycardia Sister    Hypertension Sister     Health Maintenance  Topic Date Due   DEXA SCAN  Never done   COVID-19 Vaccine (4 - 2023-24 season) 03/02/2023   Pneumonia Vaccine 27+ Years old (1 of 1 - PCV) 10/03/2023 (Originally 11/14/2003)   Medicare Annual Wellness (AWV)  04/03/2024   DTaP/Tdap/Td (2 - Td or Tdap) 03/12/2028   INFLUENZA VACCINE  Completed   Zoster Vaccines- Shingrix  Completed   HPV VACCINES  Aged Out    Allergies  Allergen Reactions   Morphine And Codeine Itching   Aspirin Other (See Comments)    Unknown reaction   Duloxetine Hcl Other (See Comments)    Unknown reaction   Tymlos [Abaloparatide] Other (See Comments)    Unknown reaction   Ventolin [Albuterol] Other (See Comments)    rapid heart beat   Cefadroxil Hives    Patient can take amoxicillin and cipro    Outpatient Encounter Medications as of 05/01/2023  Medication Sig   potassium chloride SA (KLOR-CON M) 20 MEQ tablet Take 20 mEq by mouth 2 (two) times daily.   acetaminophen (TYLENOL) 500 MG tablet Take 1,000 mg by mouth every 8 (eight)  hours as needed for moderate pain (pain score 4-6).   acetaminophen (TYLENOL) 650 MG CR tablet Take 1,300 mg by mouth at bedtime.   albuterol (VENTOLIN HFA) 108 (90 Base) MCG/ACT inhaler Inhale 2 puffs into the lungs every 4 (four) hours as needed for wheezing or  shortness of breath.   alendronate (FOSAMAX) 70 MG tablet Take 70 mg by mouth every Sunday. Take with a full glass of water on an empty stomach.   Ascorbic Acid (VITAMIN C) 1000 MG tablet Take 1,000 mg by mouth every morning.   cetirizine (ZYRTEC) 5 MG chewable tablet Chew 5 mg by mouth daily as needed for allergies.   Cholecalciferol (VITAMIN D3) 50 MCG (2000 UT) TABS Take 2,000 Units by mouth every morning.   docusate sodium (COLACE) 100 MG capsule Take 200 mg by mouth at bedtime.   dorzolamide-timolol (COSOPT) 22.3-6.8 MG/ML ophthalmic solution Place 1 drop into both eyes 2 (two) times daily.   ferrous gluconate (FERGON) 240 (27 FE) MG tablet Take 240 mg by mouth every morning. Take without food   fluticasone (FLONASE) 50 MCG/ACT nasal spray Place 1 spray into both nostrils daily as needed for allergies or rhinitis.   guaiFENesin (ROBITUSSIN) 100 MG/5ML liquid Take 10 mLs by mouth every 4 (four) hours as needed for cough or to loosen phlegm.   latanoprost (XALATAN) 0.005 % ophthalmic solution Place 1 drop into both eyes at bedtime.   LIDOCAINE EX Apply 1 application  topically in the morning and at bedtime. Right shoulder to elbow   LIDOCAINE EX Apply 1 Application topically daily. Right Ankle.   Multiple Vitamins-Minerals (PRESERVISION AREDS 2) CAPS Take 1 capsule by mouth 2 (two) times daily.   Netarsudil Dimesylate (RHOPRESSA) 0.02 % SOLN Place 1 drop into both eyes at bedtime.   oxybutynin (DITROPAN-XL) 5 MG 24 hr tablet Take 5 mg by mouth at bedtime.   pantoprazole (PROTONIX) 40 MG tablet Take 40 mg by mouth 2 (two) times daily.   polyethylene glycol (MIRALAX / GLYCOLAX) 17 g packet Take 8.5 g by mouth daily as needed  (constipation).   Probiotic Product (ALIGN) 4 MG CAPS Take 4 mg by mouth at bedtime.   rOPINIRole (REQUIP) 0.5 MG tablet Take 1 tablet (0.5 mg total) by mouth daily as needed.   sertraline (ZOLOFT) 50 MG tablet Take 1.5 tablets (75 mg total) by mouth daily.   simvastatin (ZOCOR) 20 MG tablet Take 20 mg by mouth every evening.   Sodium Fluoride (PREVIDENT 5000 BOOSTER PLUS) 1.1 % PSTE Place 1 Application onto teeth See admin instructions. Brush on teeth with a toothbrush after evening mouth care. Spit out excess and do not rinse.   umeclidinium-vilanterol (ANORO ELLIPTA) 62.5-25 MCG/ACT AEPB Inhale 1 puff into the lungs every morning.   zinc oxide 20 % ointment Apply 1 Application topically See admin instructions. Apply topically to buttocks after every incontinent episode and as needed for redness   No facility-administered encounter medications on file as of 05/01/2023.    Review of Systems  Constitutional:  Negative for activity change and appetite change.  HENT: Negative.    Respiratory:  Negative for cough and shortness of breath.   Cardiovascular:  Negative for leg swelling.  Gastrointestinal:  Negative for constipation.  Genitourinary: Negative.   Musculoskeletal:  Negative for arthralgias, gait problem and myalgias.  Skin: Negative.   Neurological:  Positive for weakness. Negative for dizziness.  Psychiatric/Behavioral:  Negative for confusion, dysphoric mood and sleep disturbance.     Vitals:   05/02/23 0909  BP: 110/68  Pulse: 82  Resp: 18  Temp: 97.8 F (36.6 C)  Weight: 170 lb 3.2 oz (77.2 kg)   Body mass index is 32.16 kg/m. Physical Exam Vitals reviewed.  Constitutional:      Appearance: Normal appearance.  HENT:  Head: Normocephalic.     Nose: Nose normal.     Mouth/Throat:     Mouth: Mucous membranes are moist.     Pharynx: Oropharynx is clear.  Eyes:     Pupils: Pupils are equal, round, and reactive to light.  Cardiovascular:     Rate and Rhythm:  Normal rate and regular rhythm.     Pulses: Normal pulses.     Heart sounds: Normal heart sounds. No murmur heard. Pulmonary:     Effort: Pulmonary effort is normal.     Breath sounds: Normal breath sounds.  Abdominal:     General: Abdomen is flat. Bowel sounds are normal.     Palpations: Abdomen is soft.  Musculoskeletal:        General: No swelling.     Cervical back: Neck supple.  Skin:    General: Skin is warm.  Neurological:     General: No focal deficit present.     Mental Status: She is alert and oriented to person, place, and time.  Psychiatric:        Mood and Affect: Mood normal.        Thought Content: Thought content normal.     Labs reviewed: Basic Metabolic Panel: Recent Labs    05/05/22 0008 05/05/22 0631 04/25/23 0322 04/27/23 0629 04/29/23 0541  NA  --    < > 142 140 141  K  --    < > 3.6 3.8 3.7  CL  --    < > 112* 106 106  CO2  --    < > 24 27 26   GLUCOSE  --    < > 108* 120* 108*  BUN  --    < > 6* 10 8  CREATININE  --    < > 0.44 0.48 0.52  CALCIUM  --    < > 8.2* 8.6* 8.7*  MG 1.9  --   --   --   --    < > = values in this interval not displayed.   Liver Function Tests: Recent Labs    03/29/23 0843 04/03/23 0000 04/19/23 1144 04/20/23 0739  AST 16 14 16 16   ALT 10 9 9 9   ALKPHOS 40 48 31* 27*  BILITOT 0.6  --  0.6 1.1  PROT 6.7  --  5.8* 5.0*  ALBUMIN 3.8 4.2 3.4* 3.0*   No results for input(s): "LIPASE", "AMYLASE" in the last 8760 hours. No results for input(s): "AMMONIA" in the last 8760 hours. CBC: Recent Labs    04/03/23 0000 04/19/23 1144 04/19/23 1227 04/26/23 1025 04/27/23 0629 04/29/23 0630  WBC 5.7 7.9   < > 5.7 5.5 5.2  NEUTROABS 33,335.00 5.3  --  3.7  --   --   HGB 10.8* 5.9*   < > 8.8* 8.4* 8.0*  HCT 34* 19.2*   < > 28.8* 27.2* 25.6*  MCV  --  103.8*   < > 99.3 98.6 97.3  PLT 195 154   < > 196 197 189   < > = values in this interval not displayed.   Cardiac Enzymes: No results for input(s): "CKTOTAL",  "CKMB", "CKMBINDEX", "TROPONINI" in the last 8760 hours. BNP: Invalid input(s): "POCBNP" No results found for: "HGBA1C" Lab Results  Component Value Date   TSH 2.47 10/08/2022   Lab Results  Component Value Date   VITAMINB12 348 05/06/2022   Lab Results  Component Value Date   FOLATE 19.6 05/06/2022   Lab Results  Component  Value Date   IRON 70 02/07/2023   TIBC 326 02/07/2023   FERRITIN 44 02/07/2023    Imaging and Procedures obtained prior to SNF admission: CT ANGIO GI BLEED  Result Date: 04/19/2023 CLINICAL DATA:  GI bleed.  Anemia EXAM: CTA ABDOMEN AND PELVIS WITHOUT AND WITH CONTRAST TECHNIQUE: Multidetector CT imaging of the abdomen and pelvis was performed using the standard protocol during bolus administration of intravenous contrast. Multiplanar reconstructed images and MIPs were obtained and reviewed to evaluate the vascular anatomy. RADIATION DOSE REDUCTION: This exam was performed according to the departmental dose-optimization program which includes automated exposure control, adjustment of the mA and/or kV according to patient size and/or use of iterative reconstruction technique. CONTRAST:  OMNIPAQUE IOHEXOL 350 MG/ML SOLN COMPARISON:  05/03/2022 FINDINGS: VASCULAR Aorta: Normal caliber aorta without aneurysm, dissection, vasculitis or significant stenosis. Moderate atherosclerosis. Celiac: Patent without evidence of aneurysm, dissection, vasculitis or significant stenosis. SMA: Patent without evidence of aneurysm, dissection, vasculitis or significant stenosis. Renals: Atherosclerotic plaque at the ostia of the bilateral renal arteries demonstrates right greater than left proximal stenosis, similar to prior. No aneurysm or evidence of fibromuscular dysplasia. IMA: Patent. Inflow: Patent without evidence of aneurysm, dissection, vasculitis or significant stenosis. Proximal Outflow: Bilateral common femoral and visualized portions of the superficial and profunda femoral  arteries are patent without evidence of aneurysm, dissection, vasculitis or significant stenosis. Veins: Major venous structures of the abdomen are patent. Review of the MIP images confirms the above findings. NON-VASCULAR Lower chest: No acute abnormality. Hepatobiliary: No focal liver abnormality. Small amount of layering sludge or small stones within the gallbladder. No gallbladder wall thickening or pericholecystic inflammatory changes by CT. No biliary dilatation. Pancreas: Unremarkable. No pancreatic ductal dilatation or surrounding inflammatory changes. Spleen: Normal in size without focal abnormality. Adrenals/Urinary Tract: Unremarkable adrenal glands. Unchanged left caliceal diverticulum. Kidneys enhance symmetrically. No stone or hydronephrosis. Urinary bladder within normal limits. Stomach/Bowel: Stomach within normal limits. No abnormally dilated loops of bowel. Normal appendix in the right lower quadrant. Severe colonic diverticulosis. No focal bowel wall thickening or inflammatory changes. No contrast accumulation within the bowel lumen is identified on multi phasic imaging to suggest active GI bleed by CT. Lymphatic: No abdominopelvic lymphadenopathy. Reproductive: Uterus and bilateral adnexa are unremarkable. Other: No free air or free fluid. 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 No acute bony abnormalities. IMPRESSION: 1. No evidence of active GI bleed by CT. If further imaging evaluation is clinically warranted, a nuclear medicine GI bleeding scan could be performed. 2. Severe colonic diverticulosis without evidence of acute diverticulitis. 3. Cholelithiasis without evidence of acute cholecystitis. 4. 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 5. Aortic atherosclerosis (ICD10-I70.0). Electronically Signed   By: Duanne Guess D.O.   On: 04/19/2023 15:46    Assessment/Plan 1. Gastrointestinal  hemorrhage, unspecified gastrointestinal hemorrhage type Detail Work up  Biopsy showed Collagenous Colitis Will Need follow up with GI Continue on Protonix 2. Essential hypertension Stable with no meds  3. Mixed hyperlipidemia On statin  4. Recurrent depression (HCC) Doing well with Zoloft  5. Simple chronic bronchitis (HCC) On Anoro and Oxygen at night  6. Generalized weakness Doing well with therapy  7. Restless legs syndrome (RLS) Requip 8 Mixed stress and urge urinary incontinence Ditropan 9 Hypokalemia Discontinue Potassium and Repeat BMP 10Senile osteoporosis Fosamax since 12/22 Repeat DEXA pending   Family/ staff Communication:   Labs/tests ordered: Bmp in 1 week

## 2023-05-02 ENCOUNTER — Encounter: Payer: Self-pay | Admitting: Internal Medicine

## 2023-05-02 DIAGNOSIS — M25511 Pain in right shoulder: Secondary | ICD-10-CM | POA: Diagnosis not present

## 2023-05-02 DIAGNOSIS — K5781 Diverticulitis of intestine, part unspecified, with perforation and abscess with bleeding: Secondary | ICD-10-CM | POA: Diagnosis not present

## 2023-05-02 DIAGNOSIS — M6281 Muscle weakness (generalized): Secondary | ICD-10-CM | POA: Diagnosis not present

## 2023-05-05 DIAGNOSIS — K5781 Diverticulitis of intestine, part unspecified, with perforation and abscess with bleeding: Secondary | ICD-10-CM | POA: Diagnosis not present

## 2023-05-05 DIAGNOSIS — M6281 Muscle weakness (generalized): Secondary | ICD-10-CM | POA: Diagnosis not present

## 2023-05-05 DIAGNOSIS — M25511 Pain in right shoulder: Secondary | ICD-10-CM | POA: Diagnosis not present

## 2023-05-06 DIAGNOSIS — R1312 Dysphagia, oropharyngeal phase: Secondary | ICD-10-CM | POA: Diagnosis not present

## 2023-05-06 DIAGNOSIS — R41841 Cognitive communication deficit: Secondary | ICD-10-CM | POA: Diagnosis not present

## 2023-05-07 DIAGNOSIS — R41841 Cognitive communication deficit: Secondary | ICD-10-CM | POA: Diagnosis not present

## 2023-05-07 DIAGNOSIS — R1312 Dysphagia, oropharyngeal phase: Secondary | ICD-10-CM | POA: Diagnosis not present

## 2023-05-08 ENCOUNTER — Non-Acute Institutional Stay (SKILLED_NURSING_FACILITY): Payer: Medicare HMO | Admitting: Internal Medicine

## 2023-05-08 ENCOUNTER — Encounter: Payer: Self-pay | Admitting: Internal Medicine

## 2023-05-08 DIAGNOSIS — K922 Gastrointestinal hemorrhage, unspecified: Secondary | ICD-10-CM | POA: Diagnosis not present

## 2023-05-08 DIAGNOSIS — R531 Weakness: Secondary | ICD-10-CM

## 2023-05-08 DIAGNOSIS — K52831 Collagenous colitis: Secondary | ICD-10-CM | POA: Diagnosis not present

## 2023-05-08 DIAGNOSIS — K5781 Diverticulitis of intestine, part unspecified, with perforation and abscess with bleeding: Secondary | ICD-10-CM | POA: Diagnosis not present

## 2023-05-08 DIAGNOSIS — J410 Simple chronic bronchitis: Secondary | ICD-10-CM

## 2023-05-08 DIAGNOSIS — M25511 Pain in right shoulder: Secondary | ICD-10-CM | POA: Diagnosis not present

## 2023-05-08 DIAGNOSIS — K5900 Constipation, unspecified: Secondary | ICD-10-CM | POA: Diagnosis not present

## 2023-05-08 DIAGNOSIS — M6281 Muscle weakness (generalized): Secondary | ICD-10-CM | POA: Diagnosis not present

## 2023-05-08 DIAGNOSIS — E782 Mixed hyperlipidemia: Secondary | ICD-10-CM

## 2023-05-08 DIAGNOSIS — I10 Essential (primary) hypertension: Secondary | ICD-10-CM

## 2023-05-08 DIAGNOSIS — F339 Major depressive disorder, recurrent, unspecified: Secondary | ICD-10-CM | POA: Diagnosis not present

## 2023-05-08 DIAGNOSIS — G2581 Restless legs syndrome: Secondary | ICD-10-CM

## 2023-05-08 NOTE — Progress Notes (Signed)
Location: [facility] of Service:  SNF (31)  Provider:   Code Status: DNR Goals of Care:     04/25/2023   10:05 AM  Advanced Directives  Does Patient Have a Medical Advance Directive? No     Chief Complaint  Patient presents with   Discharge Note    HPI: Patient is a 82 y.o. female seen today for an acute visit for Discharge from SNF to [place]  She was sent to the hospital due to GI bleeding and hemoglobin dropped to 5.9 from 10.8 She was admitted in the hospital from 10/19 to 10/29   Patient has a history of recurrent GI bleed with multiple workup, COPD on nocturnal oxygen she is former smoker, history of pulmonary nodules, hypertension, osteoporosis, urge incontinence   Patient was a sent to the hospital due to GI bleed and symptomatic anemia. She had EGD which showed acute gastritis.   Capsule endoscopy which showed focal area of red blood in proximal colon.  S/p colonoscopy with 2-day bowel prep showed mild to moderate proximal colonic ischemic looking mucosa. The biopsy results are back and it shows collagenous colitis. Her hemoglobin stabilized and her bleeding stopped and patient was sent back to the rehab for therapy   She is doing well No More rectal Bleeding Walking and doing her ADLS Going back to [place] Past Medical History:  Diagnosis Date   Anxiety    Arthritis    "bilateral knees, shoulders, elbows; neck, pretty widespread" (09/05/2017)   BPPV (benign paroxysmal positional vertigo)    Depression    GERD (gastroesophageal reflux disease)    Glaucoma, both eyes    Headache    "probably 2/month" (09/05/2017)   History of blood transfusion ~ 2008   "related to LGIB"   Hyperlipemia    Lower GI bleeding ~ 2008; 09/05/2017   "had to have blood transfusion"   Macular degeneration, bilateral    Osteopenia    Seasonal allergies    Skin cancer, basal cell 2001   "off my nose, left side"   Sleeping excessive    Tinnitus of both ears     Past  Surgical History:  Procedure Laterality Date   BALLOON DILATION N/A 05/08/2021   Procedure: BALLOON DILATION;  Surgeon: Kerin Salen, MD;  Location: [facility] ENDOSCOPY;  Service: Gastroenterology;  Laterality: N/A;   BASAL CELL CARCINOMA EXCISION  2001   "off my nose, left side"   BIOPSY  05/08/2021   Procedure: BIOPSY;  Surgeon: Kerin Salen, MD;  Location: [facility] ENDOSCOPY;  Service: Gastroenterology;;   BIOPSY  04/25/2023   Procedure: BIOPSY;  Surgeon: Charlott Rakes, MD;  Location: [facility] ENDOSCOPY;  Service: Gastroenterology;;   BLEPHAROPLASTY Bilateral    CATARACT EXTRACTION W/ INTRAOCULAR LENS  IMPLANT, BILATERAL Bilateral 1990's   COLONOSCOPY WITH PROPOFOL N/A 05/04/2022   Procedure: COLONOSCOPY WITH PROPOFOL;  Surgeon: Kerin Salen, MD;  Location: [facility] ENDOSCOPY;  Service: Gastroenterology;  Laterality: N/A;   COLONOSCOPY WITH PROPOFOL N/A 04/25/2023   Procedure: COLONOSCOPY WITH PROPOFOL;  Surgeon: Charlott Rakes, MD;  Location: [facility] ENDOSCOPY;  Service: Gastroenterology;  Laterality: N/A;   ESOPHAGOGASTRODUODENOSCOPY (EGD) WITH PROPOFOL N/A 05/08/2021   Procedure: ESOPHAGOGASTRODUODENOSCOPY (EGD) WITH PROPOFOL;  Surgeon: Kerin Salen, MD;  Location: [facility] ENDOSCOPY;  Service: Gastroenterology;  Laterality: N/A;   ESOPHAGOGASTRODUODENOSCOPY (EGD) WITH PROPOFOL N/A 01/11/2022   Procedure: ESOPHAGOGASTRODUODENOSCOPY (EGD) WITH PROPOFOL;  Surgeon: Vida Rigger, MD;  Location: [facility] ENDOSCOPY;  Service: Gastroenterology;  Laterality: N/A;   ESOPHAGOGASTRODUODENOSCOPY (EGD) WITH PROPOFOL  N/A 01/06/2023   Procedure: ESOPHAGOGASTRODUODENOSCOPY (EGD) WITH PROPOFOL;  Surgeon: Vida Rigger, MD;  Location: [facility] ENDOSCOPY;  Service: Gastroenterology;  Laterality: N/A;   ESOPHAGOGASTRODUODENOSCOPY (EGD) WITH PROPOFOL N/A 04/21/2023   Procedure: ESOPHAGOGASTRODUODENOSCOPY (EGD) WITH PROPOFOL;  Surgeon: Charlott Rakes, MD;  Location: [facility] ENDOSCOPY;  Service: Gastroenterology;  Laterality: N/A;   EYE SURGERY Bilateral    "to  improve vision after cataract OR"   GIVENS CAPSULE STUDY N/A 04/21/2023   Procedure: GIVENS CAPSULE STUDY;  Surgeon: Charlott Rakes, MD;  Location: [facility] ENDOSCOPY;  Service: Gastroenterology;  Laterality: N/A;   IR ANGIOGRAM FOLLOW UP STUDY  12/19/2020   IR ANGIOGRAM SELECTIVE EACH ADDITIONAL VESSEL  12/19/2020   IR ANGIOGRAM SELECTIVE EACH ADDITIONAL VESSEL  12/19/2020   IR ANGIOGRAM VISCERAL SELECTIVE  12/19/2020   IR EMBO ART  VEN HEMORR LYMPH EXTRAV  INC GUIDE ROADMAPPING  12/19/2020   IR US GUIDE VASC ACCESS RIGHT  12/19/2020   JOINT REPLACEMENT     POLYPECTOMY  05/04/2022   Procedure: POLYPECTOMY;  Surgeon: Kerin Salen, MD;  Location: [facility] ENDOSCOPY;  Service: Gastroenterology;;   STAPEDES SURGERY Left    "scraped stapedes because it was sticking when it wasn't suppose to"   TONSILLECTOMY AND ADENOIDECTOMY  1946   TOTAL KNEE ARTHROPLASTY Left ~ 2008   TOTAL KNEE ARTHROPLASTY  12/20/2011   Procedure: TOTAL KNEE ARTHROPLASTY;  Surgeon: Verlee Rossetti, MD;  Location: [facility] OR;  Service: Orthopedics;  Laterality: Right;  Right Total Knee Arthroplasty   TUBAL LIGATION  1980's    Allergies  Allergen Reactions   Morphine And Codeine Itching   Aspirin Other (See Comments)    Unknown reaction   Duloxetine Hcl Other (See Comments)    Unknown reaction   Tymlos [Abaloparatide] Other (See Comments)    Unknown reaction   Ventolin [Albuterol] Other (See Comments)    rapid heart beat   Cefadroxil Hives    Patient can take amoxicillin and cipro    Outpatient Encounter Medications as of 05/08/2023  Medication Sig   acetaminophen (TYLENOL) 500 MG tablet Take 1,000 mg by mouth every 8 (eight) hours as needed for moderate pain (pain score 4-6).   acetaminophen (TYLENOL) 650 MG CR tablet Take 1,300 mg by mouth at bedtime.   albuterol (VENTOLIN HFA) 108 (90 Base) MCG/ACT inhaler Inhale 2 puffs into the lungs every 4 (four) hours as needed for wheezing or shortness of breath.   alendronate (FOSAMAX) 70 MG  tablet Take 70 mg by mouth every Sunday. Take with a full glass of water on an empty stomach.   Ascorbic Acid (VITAMIN C) 1000 MG tablet Take 1,000 mg by mouth every morning.   cetirizine (ZYRTEC) 5 MG chewable tablet Chew 5 mg by mouth daily as needed for allergies.   Cholecalciferol (VITAMIN D3) 50 MCG (2000 UT) TABS Take 2,000 Units by mouth every morning.   docusate sodium (COLACE) 100 MG capsule Take 200 mg by mouth at bedtime.   dorzolamide-timolol (COSOPT) 22.3-6.8 MG/ML ophthalmic solution Place 1 drop into both eyes 2 (two) times daily.   ferrous gluconate (FERGON) 240 (27 FE) MG tablet Take 240 mg by mouth every morning. Take without food   fluticasone (FLONASE) 50 MCG/ACT nasal spray Place 1 spray into both nostrils daily as needed for allergies or rhinitis.   guaiFENesin (ROBITUSSIN) 100 MG/5ML liquid Take 10 mLs by mouth every 4 (four) hours as needed for cough or to loosen phlegm.   latanoprost (XALATAN) 0.005 % ophthalmic solution Place 1 drop  into both eyes at bedtime.   LIDOCAINE EX Apply 1 application  topically in the morning and at bedtime. Right shoulder to elbow   LIDOCAINE EX Apply 1 Application topically daily. Right Ankle.   Multiple Vitamins-Minerals (PRESERVISION AREDS 2) CAPS Take 1 capsule by mouth 2 (two) times daily.   Netarsudil Dimesylate (RHOPRESSA) 0.02 % SOLN Place 1 drop into both eyes at bedtime.   oxybutynin (DITROPAN-XL) 5 MG 24 hr tablet Take 5 mg by mouth at bedtime.   pantoprazole (PROTONIX) 40 MG tablet Take 40 mg by mouth 2 (two) times daily.   polyethylene glycol (MIRALAX / GLYCOLAX) 17 g packet Take 8.5 g by mouth daily as needed (constipation).   Probiotic Product (ALIGN) 4 MG CAPS Take 4 mg by mouth at bedtime.   rOPINIRole (REQUIP) 0.5 MG tablet Take 1 tablet (0.5 mg total) by mouth daily as needed.   sertraline (ZOLOFT) 50 MG tablet Take 1.5 tablets (75 mg total) by mouth daily.   simvastatin (ZOCOR) 20 MG tablet Take 20 mg by mouth every  evening.   Sodium Fluoride (PREVIDENT 5000 BOOSTER PLUS) 1.1 % PSTE Place 1 Application onto teeth See admin instructions. Brush on teeth with a toothbrush after evening mouth care. Spit out excess and do not rinse.   umeclidinium-vilanterol (ANORO ELLIPTA) 62.5-25 MCG/ACT AEPB Inhale 1 puff into the lungs every morning.   zinc oxide 20 % ointment Apply 1 Application topically See admin instructions. Apply topically to buttocks after every incontinent episode and as needed for redness   No facility-administered encounter medications on file as of 05/08/2023.    Review of Systems:  Review of Systems  Health Maintenance  Topic Date Due   DEXA SCAN  Never done   COVID-19 Vaccine (4 - 2023-24 season) 03/02/2023   Pneumonia Vaccine 70+ Years old (1 of 1 - PCV) 10/03/2023 (Originally 11/14/2003)   Medicare Annual Wellness (AWV)  04/03/2024   DTaP/Tdap/Td (2 - Td or Tdap) 03/12/2028   INFLUENZA VACCINE  Completed   Zoster Vaccines- Shingrix  Completed   HPV VACCINES  Aged Out    Physical Exam: Vitals:   05/08/23 2130  BP: (!) 115/58  Pulse: 70  Resp: 17  Temp: (!) 97.1 F (36.2 C)  Weight: 172 lb (78 kg)   Body mass index is 32.5 kg/m. Physical Exam Vitals reviewed.  Constitutional:      Appearance: Normal appearance.  HENT:     Head: Normocephalic.     Nose: Nose normal.     Mouth/Throat:     Mouth: Mucous membranes are moist.     Pharynx: Oropharynx is clear.  Eyes:     Pupils: Pupils are equal, round, and reactive to light.  Cardiovascular:     Rate and Rhythm: Normal rate and regular rhythm.     Pulses: Normal pulses.     Heart sounds: Normal heart sounds. No murmur heard. Pulmonary:     Effort: Pulmonary effort is normal.     Breath sounds: Normal breath sounds.  Abdominal:     General: Abdomen is flat. Bowel sounds are normal.     Palpations: Abdomen is soft.  Musculoskeletal:        General: No swelling.     Cervical back: Neck supple.  Skin:    General:  Skin is warm.  Neurological:     General: No focal deficit present.     Mental Status: She is alert and oriented to person, place, and time.  Psychiatric:  Mood and Affect: Mood normal.        Thought Content: Thought content normal.     Labs reviewed: Basic Metabolic Panel: Recent Labs    10/08/22 0000 12/23/22 0000 04/25/23 0322 04/27/23 0629 04/29/23 0541  NA 140   < > 142 140 141  K 4.5   < > 3.6 3.8 3.7  CL 106   < > 112* 106 106  CO2 29*   < > 24 27 26   GLUCOSE  --    < > 108* 120* 108*  BUN 11   < > 6* 10 8  CREATININE 0.5   < > 0.44 0.48 0.52  CALCIUM 9.2   < > 8.2* 8.6* 8.7*  TSH 2.47  --   --   --   --    < > = values in this interval not displayed.   Liver Function Tests: Recent Labs    03/29/23 0843 04/03/23 0000 04/19/23 1144 04/20/23 0739  AST 16 14 16 16   ALT 10 9 9 9   ALKPHOS 40 48 31* 27*  BILITOT 0.6  --  0.6 1.1  PROT 6.7  --  5.8* 5.0*  ALBUMIN 3.8 4.2 3.4* 3.0*   No results for input(s): "LIPASE", "AMYLASE" in the last 8760 hours. No results for input(s): "AMMONIA" in the last 8760 hours. CBC: Recent Labs    04/03/23 0000 04/19/23 1144 04/19/23 1227 04/26/23 1025 04/27/23 0629 04/29/23 0630  WBC 5.7 7.9   < > 5.7 5.5 5.2  NEUTROABS 33,335.00 5.3  --  3.7  --   --   HGB 10.8* 5.9*   < > 8.8* 8.4* 8.0*  HCT 34* 19.2*   < > 28.8* 27.2* 25.6*  MCV  --  103.8*   < > 99.3 98.6 97.3  PLT 195 154   < > 196 197 189   < > = values in this interval not displayed.   Lipid Panel: No results for input(s): "CHOL", "HDL", "LDLCALC", "TRIG", "CHOLHDL", "LDLDIRECT" in the last 8760 hours. No results found for: "HGBA1C"  Procedures since last visit: CT ANGIO GI BLEED  Result Date: 04/19/2023 CLINICAL DATA:  GI bleed.  Anemia EXAM: CTA ABDOMEN AND PELVIS WITHOUT AND WITH CONTRAST TECHNIQUE: Multidetector CT imaging of the abdomen and pelvis was performed using the standard protocol during bolus administration of intravenous contrast.  Multiplanar reconstructed images and MIPs were obtained and reviewed to evaluate the vascular anatomy. RADIATION DOSE REDUCTION: This exam was performed according to the departmental dose-optimization program which includes automated exposure control, adjustment of the mA and/or kV according to patient size and/or use of iterative reconstruction technique. CONTRAST:  OMNIPAQUE IOHEXOL 350 MG/ML SOLN COMPARISON:  05/03/2022 FINDINGS: VASCULAR Aorta: Normal caliber aorta without aneurysm, dissection, vasculitis or significant stenosis. Moderate atherosclerosis. Celiac: Patent without evidence of aneurysm, dissection, vasculitis or significant stenosis. SMA: Patent without evidence of aneurysm, dissection, vasculitis or significant stenosis. Renals: Atherosclerotic plaque at the ostia of the bilateral renal arteries demonstrates right greater than left proximal stenosis, similar to prior. No aneurysm or evidence of fibromuscular dysplasia. IMA: Patent. Inflow: Patent without evidence of aneurysm, dissection, vasculitis or significant stenosis. Proximal Outflow: Bilateral common femoral and visualized portions of the superficial and profunda femoral arteries are patent without evidence of aneurysm, dissection, vasculitis or significant stenosis. Veins: Major venous structures of the abdomen are patent. Review of the MIP images confirms the above findings. NON-VASCULAR Lower chest: No acute abnormality. Hepatobiliary: No focal liver abnormality. Small amount of  layering sludge or small stones within the gallbladder. No gallbladder wall thickening or pericholecystic inflammatory changes by CT. No biliary dilatation. Pancreas: Unremarkable. No pancreatic ductal dilatation or surrounding inflammatory changes. Spleen: Normal in size without focal abnormality. Adrenals/Urinary Tract: Unremarkable adrenal glands. Unchanged left caliceal diverticulum. Kidneys enhance symmetrically. No stone or hydronephrosis. Urinary  bladder within normal limits. Stomach/Bowel: Stomach within normal limits. No abnormally dilated loops of bowel. Normal appendix in the right lower quadrant. Severe colonic diverticulosis. No focal bowel wall thickening or inflammatory changes. No contrast accumulation within the bowel lumen is identified on multi phasic imaging to suggest active GI bleed by CT. Lymphatic: No abdominopelvic lymphadenopathy. Reproductive: Uterus and bilateral adnexa are unremarkable. Other: No free air or free fluid. 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 No acute bony abnormalities. IMPRESSION: 1. No evidence of active GI bleed by CT. If further imaging evaluation is clinically warranted, a nuclear medicine GI bleeding scan could be performed. 2. Severe colonic diverticulosis without evidence of acute diverticulitis. 3. Cholelithiasis without evidence of acute cholecystitis. 4. 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 5. Aortic atherosclerosis (ICD10-I70.0). Electronically Signed   By: Duanne Guess D.O.   On: 04/19/2023 15:46    Assessment/Plan 1. Gastrointestinal hemorrhage, unspecified gastrointestinal hemorrhage type So far no more Rectal Bleeding   2. Collagenous colitis I have given the information to her husband  He is going to call [facility] to make follow up  Possible consider Budesonide  3. Constipation, unspecified constipation type Talked to nurse patient is actually constipated Miralax  4. Essential hypertension No Meds  5. 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 Mixed hyperlipidemia statin  7. Generalized weakness Doing well   8. Simple chronic bronchitis (HCC) ANoro  9. Restless legs syndrome (RLS) Requip 10Senile osteoporosis Fosamax since 12/22 Repeat DEXA pending   Labs/tests ordered:  * No order type specified * Next appt:  Visit date not found

## 2023-05-09 LAB — BASIC METABOLIC PANEL
BUN: 11 (ref 4–21)
CO2: 26 — AB (ref 13–22)
Chloride: 107 (ref 99–108)
Creatinine: 0.6 (ref 0.5–1.1)
Glucose: 111
Potassium: 4.2 meq/L (ref 3.5–5.1)
Sodium: 143 (ref 137–147)

## 2023-05-09 LAB — COMPREHENSIVE METABOLIC PANEL: Calcium: 9.1 (ref 8.7–10.7)

## 2023-05-12 DIAGNOSIS — M25511 Pain in right shoulder: Secondary | ICD-10-CM | POA: Diagnosis not present

## 2023-05-12 DIAGNOSIS — K5781 Diverticulitis of intestine, part unspecified, with perforation and abscess with bleeding: Secondary | ICD-10-CM | POA: Diagnosis not present

## 2023-05-12 DIAGNOSIS — M6281 Muscle weakness (generalized): Secondary | ICD-10-CM | POA: Diagnosis not present

## 2023-05-13 DIAGNOSIS — M6281 Muscle weakness (generalized): Secondary | ICD-10-CM | POA: Diagnosis not present

## 2023-05-13 DIAGNOSIS — R1312 Dysphagia, oropharyngeal phase: Secondary | ICD-10-CM | POA: Diagnosis not present

## 2023-05-13 DIAGNOSIS — R41841 Cognitive communication deficit: Secondary | ICD-10-CM | POA: Diagnosis not present

## 2023-05-13 DIAGNOSIS — K5781 Diverticulitis of intestine, part unspecified, with perforation and abscess with bleeding: Secondary | ICD-10-CM | POA: Diagnosis not present

## 2023-05-13 DIAGNOSIS — M25511 Pain in right shoulder: Secondary | ICD-10-CM | POA: Diagnosis not present

## 2023-05-14 DIAGNOSIS — R1312 Dysphagia, oropharyngeal phase: Secondary | ICD-10-CM | POA: Diagnosis not present

## 2023-05-14 DIAGNOSIS — R41841 Cognitive communication deficit: Secondary | ICD-10-CM | POA: Diagnosis not present

## 2023-05-15 DIAGNOSIS — M6281 Muscle weakness (generalized): Secondary | ICD-10-CM | POA: Diagnosis not present

## 2023-05-15 DIAGNOSIS — M25511 Pain in right shoulder: Secondary | ICD-10-CM | POA: Diagnosis not present

## 2023-05-15 DIAGNOSIS — K5781 Diverticulitis of intestine, part unspecified, with perforation and abscess with bleeding: Secondary | ICD-10-CM | POA: Diagnosis not present

## 2023-05-20 DIAGNOSIS — R41841 Cognitive communication deficit: Secondary | ICD-10-CM | POA: Diagnosis not present

## 2023-05-20 DIAGNOSIS — K5781 Diverticulitis of intestine, part unspecified, with perforation and abscess with bleeding: Secondary | ICD-10-CM | POA: Diagnosis not present

## 2023-05-20 DIAGNOSIS — M6281 Muscle weakness (generalized): Secondary | ICD-10-CM | POA: Diagnosis not present

## 2023-05-20 DIAGNOSIS — M25511 Pain in right shoulder: Secondary | ICD-10-CM | POA: Diagnosis not present

## 2023-05-20 DIAGNOSIS — R1312 Dysphagia, oropharyngeal phase: Secondary | ICD-10-CM | POA: Diagnosis not present

## 2023-05-21 DIAGNOSIS — M6281 Muscle weakness (generalized): Secondary | ICD-10-CM | POA: Diagnosis not present

## 2023-05-21 DIAGNOSIS — M25511 Pain in right shoulder: Secondary | ICD-10-CM | POA: Diagnosis not present

## 2023-05-21 DIAGNOSIS — K5781 Diverticulitis of intestine, part unspecified, with perforation and abscess with bleeding: Secondary | ICD-10-CM | POA: Diagnosis not present

## 2023-05-22 DIAGNOSIS — R1312 Dysphagia, oropharyngeal phase: Secondary | ICD-10-CM | POA: Diagnosis not present

## 2023-05-22 DIAGNOSIS — M25511 Pain in right shoulder: Secondary | ICD-10-CM | POA: Diagnosis not present

## 2023-05-22 DIAGNOSIS — K5781 Diverticulitis of intestine, part unspecified, with perforation and abscess with bleeding: Secondary | ICD-10-CM | POA: Diagnosis not present

## 2023-05-22 DIAGNOSIS — M6281 Muscle weakness (generalized): Secondary | ICD-10-CM | POA: Diagnosis not present

## 2023-05-22 DIAGNOSIS — R41841 Cognitive communication deficit: Secondary | ICD-10-CM | POA: Diagnosis not present

## 2023-05-26 DIAGNOSIS — M25511 Pain in right shoulder: Secondary | ICD-10-CM | POA: Diagnosis not present

## 2023-05-26 DIAGNOSIS — K5781 Diverticulitis of intestine, part unspecified, with perforation and abscess with bleeding: Secondary | ICD-10-CM | POA: Diagnosis not present

## 2023-05-26 DIAGNOSIS — R1312 Dysphagia, oropharyngeal phase: Secondary | ICD-10-CM | POA: Diagnosis not present

## 2023-05-26 DIAGNOSIS — M6281 Muscle weakness (generalized): Secondary | ICD-10-CM | POA: Diagnosis not present

## 2023-05-26 DIAGNOSIS — R41841 Cognitive communication deficit: Secondary | ICD-10-CM | POA: Diagnosis not present

## 2023-05-27 DIAGNOSIS — K5781 Diverticulitis of intestine, part unspecified, with perforation and abscess with bleeding: Secondary | ICD-10-CM | POA: Diagnosis not present

## 2023-05-27 DIAGNOSIS — R1312 Dysphagia, oropharyngeal phase: Secondary | ICD-10-CM | POA: Diagnosis not present

## 2023-05-27 DIAGNOSIS — R41841 Cognitive communication deficit: Secondary | ICD-10-CM | POA: Diagnosis not present

## 2023-05-27 DIAGNOSIS — M6281 Muscle weakness (generalized): Secondary | ICD-10-CM | POA: Diagnosis not present

## 2023-05-27 DIAGNOSIS — M25511 Pain in right shoulder: Secondary | ICD-10-CM | POA: Diagnosis not present

## 2023-05-28 DIAGNOSIS — M6281 Muscle weakness (generalized): Secondary | ICD-10-CM | POA: Diagnosis not present

## 2023-05-28 DIAGNOSIS — K5781 Diverticulitis of intestine, part unspecified, with perforation and abscess with bleeding: Secondary | ICD-10-CM | POA: Diagnosis not present

## 2023-05-28 DIAGNOSIS — M25511 Pain in right shoulder: Secondary | ICD-10-CM | POA: Diagnosis not present

## 2023-06-02 DIAGNOSIS — R2681 Unsteadiness on feet: Secondary | ICD-10-CM | POA: Diagnosis not present

## 2023-06-02 DIAGNOSIS — Z9181 History of falling: Secondary | ICD-10-CM | POA: Diagnosis not present

## 2023-06-02 DIAGNOSIS — M6281 Muscle weakness (generalized): Secondary | ICD-10-CM | POA: Diagnosis not present

## 2023-06-03 DIAGNOSIS — M6281 Muscle weakness (generalized): Secondary | ICD-10-CM | POA: Diagnosis not present

## 2023-06-03 DIAGNOSIS — R41841 Cognitive communication deficit: Secondary | ICD-10-CM | POA: Diagnosis not present

## 2023-06-03 DIAGNOSIS — M25511 Pain in right shoulder: Secondary | ICD-10-CM | POA: Diagnosis not present

## 2023-06-03 DIAGNOSIS — R1312 Dysphagia, oropharyngeal phase: Secondary | ICD-10-CM | POA: Diagnosis not present

## 2023-06-03 DIAGNOSIS — Z9181 History of falling: Secondary | ICD-10-CM | POA: Diagnosis not present

## 2023-06-03 DIAGNOSIS — R2681 Unsteadiness on feet: Secondary | ICD-10-CM | POA: Diagnosis not present

## 2023-06-03 DIAGNOSIS — K5781 Diverticulitis of intestine, part unspecified, with perforation and abscess with bleeding: Secondary | ICD-10-CM | POA: Diagnosis not present

## 2023-06-04 DIAGNOSIS — M6281 Muscle weakness (generalized): Secondary | ICD-10-CM | POA: Diagnosis not present

## 2023-06-04 DIAGNOSIS — K52831 Collagenous colitis: Secondary | ICD-10-CM | POA: Diagnosis not present

## 2023-06-04 DIAGNOSIS — K5781 Diverticulitis of intestine, part unspecified, with perforation and abscess with bleeding: Secondary | ICD-10-CM | POA: Diagnosis not present

## 2023-06-04 DIAGNOSIS — M25511 Pain in right shoulder: Secondary | ICD-10-CM | POA: Diagnosis not present

## 2023-06-04 DIAGNOSIS — K5904 Chronic idiopathic constipation: Secondary | ICD-10-CM | POA: Diagnosis not present

## 2023-06-04 DIAGNOSIS — D126 Benign neoplasm of colon, unspecified: Secondary | ICD-10-CM | POA: Diagnosis not present

## 2023-06-05 DIAGNOSIS — K5781 Diverticulitis of intestine, part unspecified, with perforation and abscess with bleeding: Secondary | ICD-10-CM | POA: Diagnosis not present

## 2023-06-05 DIAGNOSIS — R2681 Unsteadiness on feet: Secondary | ICD-10-CM | POA: Diagnosis not present

## 2023-06-05 DIAGNOSIS — R1312 Dysphagia, oropharyngeal phase: Secondary | ICD-10-CM | POA: Diagnosis not present

## 2023-06-05 DIAGNOSIS — R41841 Cognitive communication deficit: Secondary | ICD-10-CM | POA: Diagnosis not present

## 2023-06-05 DIAGNOSIS — Z9181 History of falling: Secondary | ICD-10-CM | POA: Diagnosis not present

## 2023-06-05 DIAGNOSIS — M6281 Muscle weakness (generalized): Secondary | ICD-10-CM | POA: Diagnosis not present

## 2023-06-05 DIAGNOSIS — M25511 Pain in right shoulder: Secondary | ICD-10-CM | POA: Diagnosis not present

## 2023-06-06 DIAGNOSIS — M6281 Muscle weakness (generalized): Secondary | ICD-10-CM | POA: Diagnosis not present

## 2023-06-06 DIAGNOSIS — R2681 Unsteadiness on feet: Secondary | ICD-10-CM | POA: Diagnosis not present

## 2023-06-06 DIAGNOSIS — Z9181 History of falling: Secondary | ICD-10-CM | POA: Diagnosis not present

## 2023-06-09 ENCOUNTER — Non-Acute Institutional Stay: Payer: Self-pay | Admitting: Orthopedic Surgery

## 2023-06-09 ENCOUNTER — Encounter: Payer: Self-pay | Admitting: Orthopedic Surgery

## 2023-06-09 DIAGNOSIS — R350 Frequency of micturition: Secondary | ICD-10-CM | POA: Diagnosis not present

## 2023-06-09 DIAGNOSIS — D500 Iron deficiency anemia secondary to blood loss (chronic): Secondary | ICD-10-CM | POA: Diagnosis not present

## 2023-06-09 DIAGNOSIS — M6281 Muscle weakness (generalized): Secondary | ICD-10-CM | POA: Diagnosis not present

## 2023-06-09 DIAGNOSIS — K52831 Collagenous colitis: Secondary | ICD-10-CM

## 2023-06-09 DIAGNOSIS — M25511 Pain in right shoulder: Secondary | ICD-10-CM | POA: Diagnosis not present

## 2023-06-09 DIAGNOSIS — R2681 Unsteadiness on feet: Secondary | ICD-10-CM | POA: Diagnosis not present

## 2023-06-09 DIAGNOSIS — K5781 Diverticulitis of intestine, part unspecified, with perforation and abscess with bleeding: Secondary | ICD-10-CM | POA: Diagnosis not present

## 2023-06-09 DIAGNOSIS — Z9181 History of falling: Secondary | ICD-10-CM | POA: Diagnosis not present

## 2023-06-09 NOTE — Progress Notes (Signed)
Location:   [facility]  Nursing Home Room Number: 20-A Place of Service:  ALF [phone]) Provider:  Hazle Nordmann, NP    Patient Care Team: Octavia Heir, NP as PCP - General (Adult Health Nurse Practitioner) [name], MD as Consulting Physician (Internal Medicine)  Extended Emergency Contact Information Primary Emergency Contact: Lieurance,[name] Address: [address]          [place], [place] [address] [name] Home Phone: [phone] Mobile Phone: [phone] Relation: Spouse Secondary Emergency Contact: Mcmichen MD,[name] Jethro Poling Phone: [phone] Relation: Son Interpreter needed? No  Code Status:  DNR Goals of care: Advanced Directive information    06/09/2023    3:33 PM  Advanced Directives  Does Patient Have a Medical Advance Directive? No  Would patient like information on creating a medical advance directive? No - Patient declined     Chief Complaint  Patient presents with   Acute Visit    Urinary frequency     HPI:  Pt is a 82 y.o. female seen today for acute visit due to urinary frequency.   She currently resides on the assisted living unit at [facility] PMH: HTN, COPD, pulmonary nodule, Diverticulosis with GI bleed, GERD, OA, osteoporosis,macular degeneration, iron def anemia, unstable gait, anxiety, and depression.   H/o OAB and incontinence. Increased urinary frequency x 2 days. Reports feeling urge to urinate and unable to make it to bathroom before accident occurs. She is taking oxybutynin daily. Admits to drinking a lot of coffee during day. Does not always drink water well. Denies dysuria or flank pain. She is unable to tell if urine has odor. She has poor vision and cannot tell if any hematuria. Afebrile. Vitals stable.   H/o gastrointestinal bleed. Last hospitalization 10/19-10/29. Admission hgb 5.9, FOBT +. EGD showed acute gastritis. Capsule endoscopy showed red blood in proximal colon. Colonoscopy showed mild to  moderate colonic ischemic mucosa. Biopsies confirmed collagenous colitis. She denies black tarry stools or bloody stools today.    Past Medical History:  Diagnosis Date   Anxiety    Arthritis    "bilateral knees, shoulders, elbows; neck, pretty widespread" (09/05/2017)   BPPV (benign paroxysmal positional vertigo)    Depression    GERD (gastroesophageal reflux disease)    Glaucoma, both eyes    Headache    "probably 2/month" (09/05/2017)   History of blood transfusion ~ 2008   "related to LGIB"   Hyperlipemia    Lower GI bleeding ~ 2008; 09/05/2017   "had to have blood transfusion"   Macular degeneration, bilateral    Osteopenia    Seasonal allergies    Skin cancer, basal cell 2001   "off my nose, left side"   Sleeping excessive    Tinnitus of both ears    Past Surgical History:  Procedure Laterality Date   BALLOON DILATION N/A 05/08/2021   Procedure: BALLOON DILATION;  Surgeon: Kerin Salen, MD;  Location: [facility] ENDOSCOPY;  Service: Gastroenterology;  Laterality: N/A;   BASAL CELL CARCINOMA EXCISION  2001   "off my nose, left side"   BIOPSY  05/08/2021   Procedure: BIOPSY;  Surgeon: Kerin Salen, MD;  Location: [facility] ENDOSCOPY;  Service: Gastroenterology;;   BIOPSY  04/25/2023   Procedure: BIOPSY;  Surgeon: Charlott Rakes, MD;  Location: [facility] ENDOSCOPY;  Service: Gastroenterology;;   BLEPHAROPLASTY Bilateral    CATARACT EXTRACTION W/ INTRAOCULAR LENS  IMPLANT, BILATERAL Bilateral 1990's   COLONOSCOPY WITH PROPOFOL N/A 05/04/2022   Procedure: COLONOSCOPY WITH PROPOFOL;  Surgeon: Kerin Salen, MD;  Location: [facility] ENDOSCOPY;  Service: Gastroenterology;  Laterality: N/A;   COLONOSCOPY WITH PROPOFOL N/A 04/25/2023   Procedure: COLONOSCOPY WITH PROPOFOL;  Surgeon: Charlott Rakes, MD;  Location: [facility] ENDOSCOPY;  Service: Gastroenterology;  Laterality: N/A;   ESOPHAGOGASTRODUODENOSCOPY (EGD) WITH PROPOFOL N/A 05/08/2021   Procedure: ESOPHAGOGASTRODUODENOSCOPY (EGD) WITH PROPOFOL;  Surgeon: Kerin Salen, MD;  Location[facility] ENDOSCOPY;  Service: Gastroenterology;  Laterality: N/A;   ESOPHAGOGASTRODUODENOSCOPY (EGD) WITH PROPOFOL N/A 01/11/2022   Procedure: ESOPHAGOGASTRODUODENOSCOPY (EGD) WITH PROPOFOL;  Surgeon: Vida Rigger, MD;  Location: [facility] ENDOSCOPY;  Service: Gastroenterology;  Laterality: N/A;   ESOPHAGOGASTRODUODENOSCOPY (EGD) WITH PROPOFOL N/A 01/06/2023   Procedure: ESOPHAGOGASTRODUODENOSCOPY (EGD) WITH PROPOFOL;  Surgeon: Vida Rigger, MD;  Location: [facility] ENDOSCOPY;  Service: Gastroenterology;  Laterality: N/A;   ESOPHAGOGASTRODUODENOSCOPY (EGD) WITH PROPOFOL N/A 04/21/2023   Procedure: ESOPHAGOGASTRODUODENOSCOPY (EGD) WITH PROPOFOL;  Surgeon: Charlott Rakes, MD;  Location: [facility] ENDOSCOPY;  Service: Gastroenterology;  Laterality: N/A;   EYE SURGERY Bilateral    "to improve vision after cataract OR"   GIVENS CAPSULE STUDY N/A 04/21/2023   Procedure: GIVENS CAPSULE STUDY;  Surgeon: Charlott Rakes, MD;  Location: [facility] ENDOSCOPY;  Service: Gastroenterology;  Laterality: N/A;   IR ANGIOGRAM FOLLOW UP STUDY  12/19/2020   IR ANGIOGRAM SELECTIVE EACH ADDITIONAL VESSEL  12/19/2020   IR ANGIOGRAM SELECTIVE EACH ADDITIONAL VESSEL  12/19/2020   IR ANGIOGRAM VISCERAL SELECTIVE  12/19/2020   IR EMBO ART  VEN HEMORR LYMPH EXTRAV  INC GUIDE ROADMAPPING  12/19/2020   IR US GUIDE VASC ACCESS RIGHT  12/19/2020   JOINT REPLACEMENT     POLYPECTOMY  05/04/2022   Procedure: POLYPECTOMY;  Surgeon: Kerin Salen, MD;  Location: [facility] ENDOSCOPY;  Service: Gastroenterology;;   STAPEDES SURGERY Left    "scraped stapedes because it was sticking when it wasn't suppose to"   TONSILLECTOMY AND ADENOIDECTOMY  1946   TOTAL KNEE ARTHROPLASTY Left ~ 2008   TOTAL KNEE ARTHROPLASTY  12/20/2011   Procedure: TOTAL KNEE ARTHROPLASTY;  Surgeon: Verlee Rossetti, MD;  [facility] OR;  Service: Orthopedics;  Laterality: Right;  Right Total Knee Arthroplasty   TUBAL LIGATION  1980's    Allergies  Allergen Reactions   Morphine And  Codeine Itching   Aspirin Other (See Comments)    Unknown reaction   Duloxetine Hcl Other (See Comments)    Unknown reaction   Tymlos [Abaloparatide] Other (See Comments)    Unknown reaction   Ventolin [Albuterol] Other (See Comments)    rapid heart beat   Cefadroxil Hives    Patient can take amoxicillin and cipro    Allergies as of 06/09/2023       Reactions   Morphine And Codeine Itching   Aspirin Other (See Comments)   Unknown reaction   Duloxetine Hcl Other (See Comments)   Unknown reaction   Tymlos [abaloparatide] Other (See Comments)   Unknown reaction   Ventolin [albuterol] Other (See Comments)   rapid heart beat   Cefadroxil Hives   Patient can take amoxicillin and cipro        Medication List        Accurate as of June 09, 2023  3:36 PM. If you have any questions, ask your nurse or doctor.          acetaminophen 650 MG CR tablet Commonly known as: TYLENOL Take 1,300 mg by mouth at bedtime.   acetaminophen 500 MG tablet Commonly known as: TYLENOL Take 1,000 mg by mouth every 8 (eight) hours as  needed for moderate pain (pain score 4-6).   albuterol 108 (90 Base) MCG/ACT inhaler Commonly known as: VENTOLIN HFA Inhale 2 puffs into the lungs every 4 (four) hours as needed for wheezing or shortness of breath.   alendronate 70 MG tablet Commonly known as: FOSAMAX Take 70 mg by mouth every Sunday. Take with a full glass of water on an empty stomach.   Align 4 MG Caps Take 4 mg by mouth at bedtime.   Anoro Ellipta 62.5-25 MCG/ACT Aepb Generic drug: umeclidinium-vilanterol Inhale 1 puff into the lungs every morning.   cetirizine 5 MG chewable tablet Commonly known as: ZYRTEC Chew 5 mg by mouth daily as needed for allergies.   docusate sodium 100 MG capsule Commonly known as: COLACE Take 200 mg by mouth at bedtime.   Dorzolamide HCl-Timolol Mal PF 2-0.5 % Soln Place 1 drop into both eyes at bedtime. What changed: Another medication with the  same name was removed. Continue taking this medication, and follow the directions you see here. Changed by: Octavia Heir   ferrous gluconate 240 (27 FE) MG tablet Commonly known as: FERGON Take 240 mg by mouth every morning. Take without food   fluticasone 50 MCG/ACT nasal spray Commonly known as: FLONASE Place 1 spray into both nostrils daily as needed for allergies or rhinitis.   guaiFENesin 100 MG/5ML liquid Commonly known as: ROBITUSSIN Take 10 mLs by mouth every 4 (four) hours as needed for cough or to loosen phlegm.   latanoprost 0.005 % ophthalmic solution Commonly known as: XALATAN Place 1 drop into both eyes at bedtime.   LIDOCAINE EX Apply 1 application  topically in the morning and at bedtime. Right shoulder to elbow   LIDOCAINE EX Apply 1 Application topically daily. Right Ankle.   oxybutynin 5 MG 24 hr tablet Commonly known as: DITROPAN-XL Take 5 mg by mouth at bedtime.   pantoprazole 40 MG tablet Commonly known as: PROTONIX Take 40 mg by mouth 2 (two) times daily.   polyethylene glycol 17 g packet Commonly known as: MIRALAX / GLYCOLAX Take 8.5 g by mouth daily as needed (constipation).   PreserVision AREDS 2 Caps Take 1 capsule by mouth 2 (two) times daily.   PreviDent 5000 Booster Plus 1.1 % Pste Generic drug: Sodium Fluoride Place 1 Application onto teeth See admin instructions. Brush on teeth with a toothbrush after evening mouth care. Spit out excess and do not rinse.   Rhopressa 0.02 % Soln Generic drug: Netarsudil Dimesylate Place 1 drop into both eyes at bedtime.   rOPINIRole 0.5 MG tablet Commonly known as: REQUIP Take 1 tablet (0.5 mg total) by mouth daily as needed.   sertraline 50 MG tablet Commonly known as: ZOLOFT Take 1.5 tablets (75 mg total) by mouth daily.   simvastatin 20 MG tablet Commonly known as: ZOCOR Take 20 mg by mouth every evening.   vitamin C 1000 MG tablet Take 1,000 mg by mouth every morning.   Vitamin D3 50 MCG  (2000 UT) Tabs Take 2,000 Units by mouth every morning.   zinc oxide 20 % ointment Apply 1 Application topically See admin instructions. Apply topically to buttocks after every incontinent episode and as needed for redness        Review of Systems  Constitutional:  Positive for fatigue. Negative for activity change and appetite change.  Eyes:  Positive for visual disturbance.  Respiratory:  Negative for shortness of breath.   Cardiovascular:  Negative for chest pain.  Gastrointestinal:  Negative for  abdominal pain and constipation.  Genitourinary:  Positive for frequency and urgency. Negative for dysuria, flank pain and hematuria.  Musculoskeletal:  Positive for gait problem.  Neurological:  Positive for weakness. Negative for dizziness and light-headedness.  Psychiatric/Behavioral:  Positive for dysphoric mood. Negative for sleep disturbance. The patient is not nervous/anxious.     Immunization History  Administered Date(s) Administered   Fluad Quad(high Dose 65+) 04/24/2022   Fluad Trivalent(High Dose 65+) 04/27/2023   Influenza, High Dose Seasonal PF 03/14/2018   PFIZER(Purple Top)SARS-COV-2 Vaccination 07/25/2019, 08/16/2019, 12/12/2021   Pneumococcal-Unspecified 03/02/2011   Tdap 03/12/2018   Zoster Recombinant(Shingrix) 10/08/2017, 12/09/2017   Pertinent  Health Maintenance Due  Topic Date Due   DEXA SCAN  Never done   INFLUENZA VACCINE  Completed      05/17/2022   10:19 AM 07/12/2022   11:08 AM 10/30/2022   12:44 PM 01/22/2023    1:15 PM 04/04/2023   11:00 AM  Fall Risk  Falls in the past year? 1 0 0 0 0  Was there an injury with Fall? 1 0 0 0 0  Fall Risk Category Calculator 2 0 0 0 0  Fall Risk Category (Retired) Moderate Low     (RETIRED) Patient Fall Risk Level Moderate fall risk Low fall risk     Patient at Risk for Falls Due to History of fall(s) History of fall(s) History of fall(s);Impaired balance/gait;Impaired mobility History of fall(s);Impaired  balance/gait History of fall(s);Impaired balance/gait;Impaired mobility  Fall risk Follow up Falls evaluation completed Falls evaluation completed Falls evaluation completed;Education provided;Falls prevention discussed Falls evaluation completed;Education provided;Falls prevention discussed Falls evaluation completed;Education provided;Falls prevention discussed   Functional Status Survey:    Vitals:   06/09/23 1528  BP: 115/69  Pulse: 86  Resp: 20  Temp: 98.1 F (36.7 C)  SpO2: 96%  Weight: 180 lb 3.2 oz (81.7 kg)  Height: 5\' 1"  (1.549 m)   Body mass index is 34.05 kg/m. Physical Exam Vitals reviewed.  Constitutional:      General: She is not in acute distress. HENT:     Head: Normocephalic.  Eyes:     General:        Right eye: No discharge.        Left eye: No discharge.  Cardiovascular:     Rate and Rhythm: Normal rate and regular rhythm.     Pulses: Normal pulses.     Heart sounds: Normal heart sounds.  Pulmonary:     Effort: Pulmonary effort is normal. No respiratory distress.     Breath sounds: Normal breath sounds. No wheezing.  Abdominal:     General: Bowel sounds are normal.     Palpations: Abdomen is soft.     Tenderness: There is no right CVA tenderness or left CVA tenderness.  Musculoskeletal:     Cervical back: Neck supple.     Right lower leg: No edema.     Left lower leg: No edema.  Skin:    General: Skin is warm.     Capillary Refill: Capillary refill takes less than 2 seconds.  Neurological:     General: No focal deficit present.     Mental Status: She is alert and oriented to person, place, and time.     Motor: Weakness present.     Gait: Gait abnormal.  Psychiatric:        Mood and Affect: Mood normal.     Labs reviewed: Recent Labs    04/25/23 0322 04/27/23 0629 04/29/23 0541 05/09/23  0000  NA 142 140 141 143  K 3.6 3.8 3.7 4.2  CL 112* 106 106 107  CO2 24 27 26  26*  GLUCOSE 108* 120* 108*  --   BUN 6* 10 8 11   CREATININE  0.44 0.48 0.52 0.6  CALCIUM 8.2* 8.6* 8.7* 9.1   Recent Labs    03/29/23 0843 04/03/23 0000 04/19/23 1144 04/20/23 0739  AST 16 14 16 16   ALT 10 9 9 9   ALKPHOS 40 48 31* 27*  BILITOT 0.6  --  0.6 1.1  PROT 6.7  --  5.8* 5.0*  ALBUMIN 3.8 4.2 3.4* 3.0*   Recent Labs    04/03/23 0000 04/19/23 1144 04/19/23 1227 04/26/23 1025 04/27/23 0629 04/29/23 0630  WBC 5.7 7.9   < > 5.7 5.5 5.2  NEUTROABS 33,335.00 5.3  --  3.7  --   --   HGB 10.8* 5.9*   < > 8.8* 8.4* 8.0*  HCT 34* 19.2*   < > 28.8* 27.2* 25.6*  MCV  --  103.8*   < > 99.3 98.6 97.3  PLT 195 154   < > 196 197 189   < > = values in this interval not displayed.   Lab Results  Component Value Date   TSH 2.47 10/08/2022   No results found for: "HGBA1C" Lab Results  Component Value Date   CHOL 147 02/18/2022   HDL 51 02/18/2022   LDLCALC 72 02/18/2022   TRIG 162 (A) 02/18/2022    Significant Diagnostic Results in last 30 days:  No results found.  Assessment/Plan 1. Urinary frequency - onset x 2 days - no dysuria - h/o OAB on oxybutynin - drinking more coffee and less water - OAB versus UTI - UA/culture > if negative consider increasing oxybutynin to 10 mg   2. Collagenous colitis - confirmed by colonoscopy/biopsy last hospitalization - advised to f/u with GI> unsure if done> [facility] - consider budesonide in future  3. Iron deficiency anemia due to chronic blood loss - recent hgb 8.0 04/29/2023 - denies bloody stools - recheck cbc/diff    Family/ staff Communication: plan discussed with patient and nurse  Labs/tests ordered:  UA/culture, cbc/diff 12/12

## 2023-06-10 DIAGNOSIS — N390 Urinary tract infection, site not specified: Secondary | ICD-10-CM | POA: Diagnosis not present

## 2023-06-10 DIAGNOSIS — E785 Hyperlipidemia, unspecified: Secondary | ICD-10-CM | POA: Diagnosis not present

## 2023-06-10 DIAGNOSIS — R41841 Cognitive communication deficit: Secondary | ICD-10-CM | POA: Diagnosis not present

## 2023-06-10 DIAGNOSIS — I872 Venous insufficiency (chronic) (peripheral): Secondary | ICD-10-CM | POA: Diagnosis not present

## 2023-06-10 DIAGNOSIS — R1312 Dysphagia, oropharyngeal phase: Secondary | ICD-10-CM | POA: Diagnosis not present

## 2023-06-10 DIAGNOSIS — E559 Vitamin D deficiency, unspecified: Secondary | ICD-10-CM | POA: Diagnosis not present

## 2023-06-11 DIAGNOSIS — K5781 Diverticulitis of intestine, part unspecified, with perforation and abscess with bleeding: Secondary | ICD-10-CM | POA: Diagnosis not present

## 2023-06-11 DIAGNOSIS — R2681 Unsteadiness on feet: Secondary | ICD-10-CM | POA: Diagnosis not present

## 2023-06-11 DIAGNOSIS — Z9181 History of falling: Secondary | ICD-10-CM | POA: Diagnosis not present

## 2023-06-11 DIAGNOSIS — M25511 Pain in right shoulder: Secondary | ICD-10-CM | POA: Diagnosis not present

## 2023-06-11 DIAGNOSIS — M6281 Muscle weakness (generalized): Secondary | ICD-10-CM | POA: Diagnosis not present

## 2023-06-12 DIAGNOSIS — R41841 Cognitive communication deficit: Secondary | ICD-10-CM | POA: Diagnosis not present

## 2023-06-12 DIAGNOSIS — R1312 Dysphagia, oropharyngeal phase: Secondary | ICD-10-CM | POA: Diagnosis not present

## 2023-06-13 ENCOUNTER — Non-Acute Institutional Stay: Payer: Self-pay | Admitting: Orthopedic Surgery

## 2023-06-13 ENCOUNTER — Encounter: Payer: Self-pay | Admitting: Orthopedic Surgery

## 2023-06-13 DIAGNOSIS — R2681 Unsteadiness on feet: Secondary | ICD-10-CM | POA: Diagnosis not present

## 2023-06-13 DIAGNOSIS — N3281 Overactive bladder: Secondary | ICD-10-CM

## 2023-06-13 DIAGNOSIS — R41841 Cognitive communication deficit: Secondary | ICD-10-CM | POA: Diagnosis not present

## 2023-06-13 DIAGNOSIS — M6281 Muscle weakness (generalized): Secondary | ICD-10-CM | POA: Diagnosis not present

## 2023-06-13 DIAGNOSIS — N3000 Acute cystitis without hematuria: Secondary | ICD-10-CM

## 2023-06-13 DIAGNOSIS — R1312 Dysphagia, oropharyngeal phase: Secondary | ICD-10-CM | POA: Diagnosis not present

## 2023-06-13 DIAGNOSIS — Z9181 History of falling: Secondary | ICD-10-CM | POA: Diagnosis not present

## 2023-06-13 MED ORDER — CIPROFLOXACIN HCL 500 MG PO TABS: 500.0000 mg | ORAL_TABLET | Freq: Two times a day (BID) | ORAL | Status: AC

## 2023-06-13 NOTE — Progress Notes (Signed)
Location:  [facility] Room Number: 20/A Place of Service:  ALF [phone]) Provider:  Octavia Heir, NP   Octavia Heir, NP  Patient Care Team: Octavia Heir, NP as PCP - General (Adult Health Nurse Practitioner) [name], MD as Consulting Physician (Internal Medicine)  Extended Emergency Contact Information Primary Emergency Contact: Stalker,[name] Address: [address]          [place], [place] [address] [name] Phone: [phone] Mobile Phone: [phone] Relation: Spouse Secondary Emergency Contact: Lorah MD,[name] Jethro Poling Phone: [phone] Relation: Son Interpreter needed? No  Code Status:  DNR Goals of care: Advanced Directive information    06/09/2023    3:33 PM  Advanced Directives  Does Patient Have a Medical Advance Directive? Yes  Type of Advance Directive Out of facility DNR (pink MOST or yellow form)  Does patient want to make changes to medical advance directive? No - Patient declined     No chief complaint on file.   HPI:  Pt is a 82 y.o. female seen today for acute visit due to abnormal urine culture.  She currently resides on the assisted living unit at [facility] PMH: HTN, COPD, pulmonary nodule, Diverticulosis with GI bleed, GERD, OA, osteoporosis,macular degeneration, iron def anemia, unstable gait, anxiety, and depression.   12/09 she reported increased urinary frequency. H/o OAB. She takes oxybutynin. UA with + nitrates, 3+ leukocytes, + WBC and bacteria. Urine culture > 100,000 cfu/mL E.coli. she continues to have increased urinary frequency today. Also having some dysuria. Admits to not drinking fluids well. Denies fever or flank pain. Afebrile. Vitals stable.     Past Medical History:  Diagnosis Date   Anxiety    Arthritis    "bilateral knees, shoulders, elbows; neck, pretty widespread" (09/05/2017)   BPPV (benign paroxysmal positional vertigo)    Depression    GERD (gastroesophageal  reflux disease)    Glaucoma, both eyes    Headache    "probably 2/month" (09/05/2017)   History of blood transfusion ~ 2008   "related to LGIB"   Hyperlipemia    Lower GI bleeding ~ 2008; 09/05/2017   "had to have blood transfusion"   Macular degeneration, bilateral    Osteopenia    Seasonal allergies    Skin cancer, basal cell 2001   "off my nose, left side"   Sleeping excessive    Tinnitus of both ears    Past Surgical History:  Procedure Laterality Date   BALLOON DILATION N/A 05/08/2021   Procedure: BALLOON DILATION;  Surgeon: Kerin Salen, MD;  Location: [facility] ENDOSCOPY;  Service: Gastroenterology;  Laterality: N/A;   BASAL CELL CARCINOMA EXCISION  2001   "off my nose, left side"   BIOPSY  05/08/2021   Procedure: BIOPSY;  Surgeon: Kerin Salen, MD;  Location: [facility] ENDOSCOPY;  Service: Gastroenterology;;   BIOPSY  04/25/2023   Procedure: BIOPSY;  Surgeon: Charlott Rakes, MD;  Location: [facility] ENDOSCOPY;  Service: Gastroenterology;;   BLEPHAROPLASTY Bilateral    CATARACT EXTRACTION W/ INTRAOCULAR LENS  IMPLANT, BILATERAL Bilateral 1990's   COLONOSCOPY WITH PROPOFOL N/A 05/04/2022   Procedure: COLONOSCOPY WITH PROPOFOL;  Surgeon: Kerin Salen, MD;  Location: [facility] ENDOSCOPY;  Service: Gastroenterology;  Laterality: N/A;   COLONOSCOPY WITH PROPOFOL N/A 04/25/2023   Procedure: COLONOSCOPY WITH PROPOFOL;  Surgeon: Charlott Rakes, MD;  Location: [facility] ENDOSCOPY;  Service: Gastroenterology;  Laterality: N/A;   ESOPHAGOGASTRODUODENOSCOPY (EGD) WITH PROPOFOL N/A 05/08/2021   Procedure: ESOPHAGOGASTRODUODENOSCOPY (EGD) WITH PROPOFOL;  Surgeon: Kerin Salen, MD;  Location: [facility] ENDOSCOPY;  Service: Gastroenterology;  Laterality: N/A;   ESOPHAGOGASTRODUODENOSCOPY (EGD) WITH PROPOFOL N/A 01/11/2022   Procedure: ESOPHAGOGASTRODUODENOSCOPY (EGD) WITH PROPOFOL;  Surgeon: Vida Rigger, MD;  Location: [facility] ENDOSCOPY;  Service: Gastroenterology;  Laterality: N/A;   ESOPHAGOGASTRODUODENOSCOPY (EGD) WITH PROPOFOL N/A  01/06/2023   Procedure: ESOPHAGOGASTRODUODENOSCOPY (EGD) WITH PROPOFOL;  Surgeon: Vida Rigger, MD;  Location: [facility] ENDOSCOPY;  Service: Gastroenterology;  Laterality: N/A;   ESOPHAGOGASTRODUODENOSCOPY (EGD) WITH PROPOFOL N/A 04/21/2023   Procedure: ESOPHAGOGASTRODUODENOSCOPY (EGD) WITH PROPOFOL;  Surgeon: Charlott Rakes, MD;  Location: [facility] ENDOSCOPY;  Service: Gastroenterology;  Laterality: N/A;   EYE SURGERY Bilateral    "to improve vision after cataract OR"   GIVENS CAPSULE STUDY N/A 04/21/2023   Procedure: GIVENS CAPSULE STUDY;  Surgeon: Charlott Rakes, MD;  Location: [facility] ENDOSCOPY;  Service: Gastroenterology;  Laterality: N/A;   IR ANGIOGRAM FOLLOW UP STUDY  12/19/2020   IR ANGIOGRAM SELECTIVE EACH ADDITIONAL VESSEL  12/19/2020   IR ANGIOGRAM SELECTIVE EACH ADDITIONAL VESSEL  12/19/2020   IR ANGIOGRAM VISCERAL SELECTIVE  12/19/2020   IR EMBO ART  VEN HEMORR LYMPH EXTRAV  INC GUIDE ROADMAPPING  12/19/2020   IR US GUIDE VASC ACCESS RIGHT  12/19/2020   JOINT REPLACEMENT     POLYPECTOMY  05/04/2022   Procedure: POLYPECTOMY;  Surgeon: Kerin Salen, MD;  Location: [facility] ENDOSCOPY;  Service: Gastroenterology;;   STAPEDES SURGERY Left    "scraped stapedes because it was sticking when it wasn't suppose to"   TONSILLECTOMY AND ADENOIDECTOMY  1946   TOTAL KNEE ARTHROPLASTY Left ~ 2008   TOTAL KNEE ARTHROPLASTY  12/20/2011   Procedure: TOTAL KNEE ARTHROPLASTY;  Surgeon: Verlee Rossetti, MD;  Location: [facility] OR;  Service: Orthopedics;  Laterality: Right;  Right Total Knee Arthroplasty   TUBAL LIGATION  1980's    Allergies  Allergen Reactions   Morphine And Codeine Itching   Aspirin Other (See Comments)    Unknown reaction   Duloxetine Hcl Other (See Comments)    Unknown reaction   Tymlos [Abaloparatide] Other (See Comments)    Unknown reaction   Ventolin [Albuterol] Other (See Comments)    rapid heart beat   Cefadroxil Hives    Patient can take amoxicillin and cipro    Outpatient Encounter  Medications as of 06/13/2023  Medication Sig   acetaminophen (TYLENOL) 500 MG tablet Take 1,000 mg by mouth every 8 (eight) hours as needed for moderate pain (pain score 4-6).   acetaminophen (TYLENOL) 650 MG CR tablet Take 1,300 mg by mouth at bedtime.   albuterol (VENTOLIN HFA) 108 (90 Base) MCG/ACT inhaler Inhale 2 puffs into the lungs every 4 (four) hours as needed for wheezing or shortness of breath.   alendronate (FOSAMAX) 70 MG tablet Take 70 mg by mouth every Sunday. Take with a full glass of water on an empty stomach.   Ascorbic Acid (VITAMIN C) 1000 MG tablet Take 1,000 mg by mouth every morning.   cetirizine (ZYRTEC) 5 MG chewable tablet Chew 5 mg by mouth daily as needed for allergies.   Cholecalciferol (VITAMIN D3) 50 MCG (2000 UT) TABS Take 2,000 Units by mouth every morning.   docusate sodium (COLACE) 100 MG capsule Take 200 mg by mouth at bedtime.   Dorzolamide HCl-Timolol Mal PF 2-0.5 % SOLN Place 1 drop into both eyes at bedtime.   ferrous gluconate (FERGON) 240 (27 FE) MG tablet Take 240 mg by mouth every morning. Take without food   fluticasone (FLONASE) 50 MCG/ACT nasal  spray Place 1 spray into both nostrils daily as needed for allergies or rhinitis.   guaiFENesin (ROBITUSSIN) 100 MG/5ML liquid Take 10 mLs by mouth every 4 (four) hours as needed for cough or to loosen phlegm.   latanoprost (XALATAN) 0.005 % ophthalmic solution Place 1 drop into both eyes at bedtime.   LIDOCAINE EX Apply 1 application  topically in the morning and at bedtime. Right shoulder to elbow   LIDOCAINE EX Apply 1 Application topically daily. Right Ankle.   Multiple Vitamins-Minerals (PRESERVISION AREDS 2) CAPS Take 1 capsule by mouth 2 (two) times daily.   Netarsudil Dimesylate (RHOPRESSA) 0.02 % SOLN Place 1 drop into both eyes at bedtime.   oxybutynin (DITROPAN-XL) 5 MG 24 hr tablet Take 5 mg by mouth at bedtime.   pantoprazole (PROTONIX) 40 MG tablet Take 40 mg by mouth 2 (two) times daily.    polyethylene glycol (MIRALAX / GLYCOLAX) 17 g packet Take 8.5 g by mouth daily as needed (constipation).   Probiotic Product (ALIGN) 4 MG CAPS Take 4 mg by mouth at bedtime.   rOPINIRole (REQUIP) 0.5 MG tablet Take 1 tablet (0.5 mg total) by mouth daily as needed.   sertraline (ZOLOFT) 50 MG tablet Take 1.5 tablets (75 mg total) by mouth daily.   simvastatin (ZOCOR) 20 MG tablet Take 20 mg by mouth every evening.   Sodium Fluoride (PREVIDENT 5000 BOOSTER PLUS) 1.1 % PSTE Place 1 Application onto teeth See admin instructions. Brush on teeth with a toothbrush after evening mouth care. Spit out excess and do not rinse.   umeclidinium-vilanterol (ANORO ELLIPTA) 62.5-25 MCG/ACT AEPB Inhale 1 puff into the lungs every morning.   zinc oxide 20 % ointment Apply 1 Application topically See admin instructions. Apply topically to buttocks after every incontinent episode and as needed for redness   No facility-administered encounter medications on file as of 06/13/2023.    Review of Systems  Constitutional:  Negative for activity change and appetite change.  Respiratory:  Negative for shortness of breath.   Cardiovascular:  Negative for chest pain.  Genitourinary:  Positive for dysuria and frequency.  Musculoskeletal:  Positive for gait problem.  Psychiatric/Behavioral:  Positive for dysphoric mood. The patient is nervous/anxious.     Immunization History  Administered Date(s) Administered   Fluad Quad(high Dose 65+) 04/24/2022   Fluad Trivalent(High Dose 65+) 04/27/2023   Influenza, High Dose Seasonal PF 03/14/2018   PFIZER(Purple Top)SARS-COV-2 Vaccination 07/25/2019, 08/16/2019, 12/12/2021   Pneumococcal-Unspecified 03/02/2011   Tdap 03/12/2018   Zoster Recombinant(Shingrix) 10/08/2017, 12/09/2017   Pertinent  Health Maintenance Due  Topic Date Due   DEXA SCAN  Never done   INFLUENZA VACCINE  Completed      05/17/2022   10:19 AM 07/12/2022   11:08 AM 10/30/2022   12:44 PM 01/22/2023     1:15 PM 04/04/2023   11:00 AM  Fall Risk  Falls in the past year? 1 0 0 0 0  Was there an injury with Fall? 1 0 0 0 0  Fall Risk Category Calculator 2 0 0 0 0  Fall Risk Category (Retired) Moderate Low     (RETIRED) Patient Fall Risk Level Moderate fall risk Low fall risk     Patient at Risk for Falls Due to History of fall(s) History of fall(s) History of fall(s);Impaired balance/gait;Impaired mobility History of fall(s);Impaired balance/gait History of fall(s);Impaired balance/gait;Impaired mobility  Fall risk Follow up Falls evaluation completed Falls evaluation completed Falls evaluation completed;Education provided;Falls prevention discussed Falls evaluation completed;Education provided;Falls  prevention discussed Falls evaluation completed;Education provided;Falls prevention discussed   Functional Status Survey:    Vitals:   06/13/23 1241  BP: 108/60  Pulse: 77  Resp: 17  Temp: (!) 97.3 F (36.3 C)  SpO2: 96%  Weight: 180 lb 3.2 oz (81.7 kg)  Height: 5\' 1"  (1.549 m)   Body mass index is 34.05 kg/m. Physical Exam Vitals reviewed.  Constitutional:      General: She is not in acute distress. HENT:     Head: Normocephalic.  Eyes:     General:        Right eye: No discharge.        Left eye: No discharge.  Cardiovascular:     Rate and Rhythm: Normal rate and regular rhythm.     Pulses: Normal pulses.     Heart sounds: Normal heart sounds.  Pulmonary:     Effort: Pulmonary effort is normal.     Breath sounds: Normal breath sounds.  Abdominal:     Tenderness: There is no right CVA tenderness or left CVA tenderness.  Musculoskeletal:     Cervical back: Neck supple.     Right lower leg: Edema present.     Left lower leg: Edema present.  Skin:    General: Skin is warm.     Capillary Refill: Capillary refill takes less than 2 seconds.  Neurological:     General: No focal deficit present.     Mental Status: She is alert and oriented to person, place, and time.      Motor: Weakness present.     Gait: Gait abnormal.  Psychiatric:        Mood and Affect: Mood normal.     Labs reviewed: Recent Labs    04/25/23 0322 04/27/23 0629 04/29/23 0541 05/09/23 0000  NA 142 140 141 143  K 3.6 3.8 3.7 4.2  CL 112* 106 106 107  CO2 24 27 26  26*  GLUCOSE 108* 120* 108*  --   BUN 6* 10 8 11   CREATININE 0.44 0.48 0.52 0.6  CALCIUM 8.2* 8.6* 8.7* 9.1   Recent Labs    03/29/23 0843 04/03/23 0000 04/19/23 1144 04/20/23 0739  AST 16 14 16 16   ALT 10 9 9 9   ALKPHOS 40 48 31* 27*  BILITOT 0.6  --  0.6 1.1  PROT 6.7  --  5.8* 5.0*  ALBUMIN 3.8 4.2 3.4* 3.0*   Recent Labs    04/03/23 0000 04/19/23 1144 04/19/23 1227 04/26/23 1025 04/27/23 0629 04/29/23 0630  WBC 5.7 7.9   < > 5.7 5.5 5.2  NEUTROABS 33,335.00 5.3  --  3.7  --   --   HGB 10.8* 5.9*   < > 8.8* 8.4* 8.0*  HCT 34* 19.2*   < > 28.8* 27.2* 25.6*  MCV  --  103.8*   < > 99.3 98.6 97.3  PLT 195 154   < > 196 197 189   < > = values in this interval not displayed.   Lab Results  Component Value Date   TSH 2.47 10/08/2022   No results found for: "HGBA1C" Lab Results  Component Value Date   CHOL 147 02/18/2022   HDL 51 02/18/2022   LDLCALC 72 02/18/2022   TRIG 162 (A) 02/18/2022    Significant Diagnostic Results in last 30 days:  No results found.  Assessment/Plan 1. Acute cystitis without hematuria (Primary) - 12/09 increased frequency x 2 days - UA with + nitrates, 3+ leukocytes, + WBC and  bacteria - Urine culture > 100,000 cfu/mL E.coli - now having dysuria - start Cipro 500 mg po BID x 5 days - encourage hydration with water  2. OAB (overactive bladder) - see above - cont oxybutynin    Family/ staff Communication: plan discussed with patient and nurse  Labs/tests ordered:  none

## 2023-06-16 DIAGNOSIS — R2681 Unsteadiness on feet: Secondary | ICD-10-CM | POA: Diagnosis not present

## 2023-06-16 DIAGNOSIS — M6281 Muscle weakness (generalized): Secondary | ICD-10-CM | POA: Diagnosis not present

## 2023-06-16 DIAGNOSIS — Z9181 History of falling: Secondary | ICD-10-CM | POA: Diagnosis not present

## 2023-06-17 DIAGNOSIS — R41841 Cognitive communication deficit: Secondary | ICD-10-CM | POA: Diagnosis not present

## 2023-06-17 DIAGNOSIS — R1312 Dysphagia, oropharyngeal phase: Secondary | ICD-10-CM | POA: Diagnosis not present

## 2023-06-18 DIAGNOSIS — Z9181 History of falling: Secondary | ICD-10-CM | POA: Diagnosis not present

## 2023-06-18 DIAGNOSIS — M6281 Muscle weakness (generalized): Secondary | ICD-10-CM | POA: Diagnosis not present

## 2023-06-18 DIAGNOSIS — R2681 Unsteadiness on feet: Secondary | ICD-10-CM | POA: Diagnosis not present

## 2023-06-19 DIAGNOSIS — R1312 Dysphagia, oropharyngeal phase: Secondary | ICD-10-CM | POA: Diagnosis not present

## 2023-06-19 DIAGNOSIS — R41841 Cognitive communication deficit: Secondary | ICD-10-CM | POA: Diagnosis not present

## 2023-06-20 DIAGNOSIS — Z9181 History of falling: Secondary | ICD-10-CM | POA: Diagnosis not present

## 2023-06-20 DIAGNOSIS — M6281 Muscle weakness (generalized): Secondary | ICD-10-CM | POA: Diagnosis not present

## 2023-06-20 DIAGNOSIS — R2681 Unsteadiness on feet: Secondary | ICD-10-CM | POA: Diagnosis not present

## 2023-06-23 DIAGNOSIS — R2681 Unsteadiness on feet: Secondary | ICD-10-CM | POA: Diagnosis not present

## 2023-06-23 DIAGNOSIS — M6281 Muscle weakness (generalized): Secondary | ICD-10-CM | POA: Diagnosis not present

## 2023-06-23 DIAGNOSIS — R1312 Dysphagia, oropharyngeal phase: Secondary | ICD-10-CM | POA: Diagnosis not present

## 2023-06-23 DIAGNOSIS — Z9181 History of falling: Secondary | ICD-10-CM | POA: Diagnosis not present

## 2023-06-23 DIAGNOSIS — R41841 Cognitive communication deficit: Secondary | ICD-10-CM | POA: Diagnosis not present

## 2023-06-26 DIAGNOSIS — R41841 Cognitive communication deficit: Secondary | ICD-10-CM | POA: Diagnosis not present

## 2023-06-26 DIAGNOSIS — R1312 Dysphagia, oropharyngeal phase: Secondary | ICD-10-CM | POA: Diagnosis not present

## 2023-06-28 DIAGNOSIS — Z9181 History of falling: Secondary | ICD-10-CM | POA: Diagnosis not present

## 2023-06-28 DIAGNOSIS — M6281 Muscle weakness (generalized): Secondary | ICD-10-CM | POA: Diagnosis not present

## 2023-06-28 DIAGNOSIS — R2681 Unsteadiness on feet: Secondary | ICD-10-CM | POA: Diagnosis not present

## 2023-06-30 DIAGNOSIS — R1312 Dysphagia, oropharyngeal phase: Secondary | ICD-10-CM | POA: Diagnosis not present

## 2023-06-30 DIAGNOSIS — Z9181 History of falling: Secondary | ICD-10-CM | POA: Diagnosis not present

## 2023-06-30 DIAGNOSIS — R41841 Cognitive communication deficit: Secondary | ICD-10-CM | POA: Diagnosis not present

## 2023-06-30 DIAGNOSIS — R2681 Unsteadiness on feet: Secondary | ICD-10-CM | POA: Diagnosis not present

## 2023-06-30 DIAGNOSIS — M6281 Muscle weakness (generalized): Secondary | ICD-10-CM | POA: Diagnosis not present

## 2023-07-01 DIAGNOSIS — Z9181 History of falling: Secondary | ICD-10-CM | POA: Diagnosis not present

## 2023-07-01 DIAGNOSIS — M6281 Muscle weakness (generalized): Secondary | ICD-10-CM | POA: Diagnosis not present

## 2023-07-01 DIAGNOSIS — R2681 Unsteadiness on feet: Secondary | ICD-10-CM | POA: Diagnosis not present

## 2023-07-02 DIAGNOSIS — R41841 Cognitive communication deficit: Secondary | ICD-10-CM | POA: Diagnosis not present

## 2023-07-02 DIAGNOSIS — R1312 Dysphagia, oropharyngeal phase: Secondary | ICD-10-CM | POA: Diagnosis not present

## 2023-07-08 DIAGNOSIS — R1312 Dysphagia, oropharyngeal phase: Secondary | ICD-10-CM | POA: Diagnosis not present

## 2023-07-08 DIAGNOSIS — R41841 Cognitive communication deficit: Secondary | ICD-10-CM | POA: Diagnosis not present

## 2023-07-09 ENCOUNTER — Non-Acute Institutional Stay: Payer: Medicare HMO | Admitting: Orthopedic Surgery

## 2023-07-09 ENCOUNTER — Encounter: Payer: Self-pay | Admitting: Orthopedic Surgery

## 2023-07-09 DIAGNOSIS — Z8719 Personal history of other diseases of the digestive system: Secondary | ICD-10-CM

## 2023-07-09 DIAGNOSIS — N3281 Overactive bladder: Secondary | ICD-10-CM

## 2023-07-09 DIAGNOSIS — D500 Iron deficiency anemia secondary to blood loss (chronic): Secondary | ICD-10-CM

## 2023-07-09 DIAGNOSIS — R41841 Cognitive communication deficit: Secondary | ICD-10-CM | POA: Diagnosis not present

## 2023-07-09 DIAGNOSIS — M810 Age-related osteoporosis without current pathological fracture: Secondary | ICD-10-CM

## 2023-07-09 DIAGNOSIS — E7800 Pure hypercholesterolemia, unspecified: Secondary | ICD-10-CM | POA: Diagnosis not present

## 2023-07-09 DIAGNOSIS — K52831 Collagenous colitis: Secondary | ICD-10-CM

## 2023-07-09 DIAGNOSIS — J449 Chronic obstructive pulmonary disease, unspecified: Secondary | ICD-10-CM

## 2023-07-09 DIAGNOSIS — F339 Major depressive disorder, recurrent, unspecified: Secondary | ICD-10-CM

## 2023-07-09 DIAGNOSIS — G2581 Restless legs syndrome: Secondary | ICD-10-CM | POA: Diagnosis not present

## 2023-07-09 DIAGNOSIS — R1312 Dysphagia, oropharyngeal phase: Secondary | ICD-10-CM | POA: Diagnosis not present

## 2023-07-09 DIAGNOSIS — M150 Primary generalized (osteo)arthritis: Secondary | ICD-10-CM | POA: Diagnosis not present

## 2023-07-09 NOTE — Progress Notes (Signed)
 Location:  [facility] Room Number: 20/A Place of Service:  ALF [phone]) Provider:  Greig FORBES Cluster, NP   Cluster Greig FORBES, NP  Patient Care Team: Cluster Greig FORBES, NP as PCP - General (Adult Health Nurse Practitioner) Charlanne Fredia CROME, MD as Consulting Physician (Internal Medicine)  Extended Emergency Contact Information Primary Emergency Contact: Sorbo,[name] Address: [address]          [place], [place] [address] [place] Home Phone: [phone] Mobile Phone: [phone] Relation: Spouse Secondary Emergency Contact: Perham MD,[name] Ozell Crane Phone: [phone] Relation: Son Interpreter needed? No  Code Status:  DNR Goals of care: Advanced Directive information    06/09/2023    3:33 PM  Advanced Directives  Does Patient Have a Medical Advance Directive? Yes  Type of Advance Directive Out of facility DNR (pink MOST or yellow form)  Does patient want to make changes to medical advance directive? No - Patient declined     Chief Complaint  Patient presents with   Medical Management of Chronic Issues    HPI:  Pt is a 83 y.o. female seen today for medical management of chronic diseases.    She currently resides on the assisted living unit at [facility]. PMH: HTN, COPD, pulmonary nodule, Diverticulosis with GI bleed, GERD, OA, osteoporosis,macular degeneration, iron  def anemia, unstable gait, urinary incontinence, anxiety, and depression.    H/o diverticular bleed- multiple hospitalizations within past 2 years, (recent hospitalization 10/19-10/29), 10/25 colonoscopy showed moderate proximal colonic ischemic mucosa, biopsies taken> confirmed collagenous colitis, no GI follow up, no episodes of bloody stools, remains on Protonix  Blood loss anemia- see above, admits to fatigue, remains on ferrous gluconate , hgb 8.0 (04/29/2023) OAB/ incontinence- 12/13 urine culture > 100,000 cfu/mL E.coli> resolved with Cipro  x 5 days, continues to have  frequency throughout day, remains on oxybutynin  COPD- h/o smoking, uses oxygen  at night, remains on anoro Ellipta   HLD- LDL 72 01/2022, remains on simvastatin  Recurrent depression- related to declining vision/chronic conditions/ living in AL, Na+ 143 05/09/2023, remains on Zoloft  OA- involves multiple joints, knees and neck most bothersome, remains on scheduled tylenol   RLS- remains on ropinirole  Osteoporosis- ordered but no scheduled, remains on Fosamax  Recent blood pressure:   01/01- 111/67  12/25- 115/73  12/18- 91/64  Recent weights:  01/06- 177 lbs  12/01- 180.2 lbs  11/05- 170.1 lbs      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, left side   Sleeping excessive    Tinnitus of both ears    Past Surgical History:  Procedure Laterality Date   BALLOON DILATION N/A 05/08/2021   Procedure: BALLOON DILATION;  Surgeon: Saintclair Jasper, MD;  Location: [facility] ENDOSCOPY;  Service: Gastroenterology;  Laterality: N/A;   BASAL CELL CARCINOMA EXCISION  2001   off my nose, left side   BIOPSY  05/08/2021  Procedure: BIOPSY;  Surgeon: Saintclair Jasper, MD;  Location: [facility] ENDOSCOPY;  Service: Gastroenterology;;   BIOPSY  04/25/2023   Procedure: BIOPSY;  Surgeon: Dianna Specking, MD;  Location: [facility]COPY;  Service: Gastroenterology;;   BLEPHAROPLASTY Bilateral    CATARACT EXTRACTION W/ INTRAOCULAR LENS  IMPLANT, BILATERAL Bilateral 1990's   COLONOSCOPY WITH  PROPOFOL  N/A 05/04/2022   Procedure: COLONOSCOPY WITH PROPOFOL ;  Surgeon: Saintclair Jasper, MD;  Location: [facility] ENDOSCOPY;  Service: Gastroenterology;  Laterality: N/A;   COLONOSCOPY WITH PROPOFOL  N/A 04/25/2023   Procedure: COLONOSCOPY WITH PROPOFOL ;  Surgeon: Dianna Specking, MD;  Location: [facility] ENDOSCOPY;  Service: Gastroenterology;  Laterality: N/A;   ESOPHAGOGASTRODUODENOSCOPY (EGD) WITH PROPOFOL  N/A 05/08/2021   Procedure: ESOPHAGOGASTRODUODENOSCOPY (EGD) WITH PROPOFOL ;  Surgeon: Saintclair Jasper, MD;  Location: [facility] ENDOSCOPY;  Service: Gastroenterology;  Laterality: N/A;   ESOPHAGOGASTRODUODENOSCOPY (EGD) WITH PROPOFOL  N/A 01/11/2022   Procedure: ESOPHAGOGASTRODUODENOSCOPY (EGD) WITH PROPOFOL ;  Surgeon: Rosalie Kitchens, MD;  Location: [facility] ENDOSCOPY;  Service: Gastroenterology;  Laterality: N/A;   ESOPHAGOGASTRODUODENOSCOPY (EGD) WITH PROPOFOL  N/A 01/06/2023   Procedure: ESOPHAGOGASTRODUODENOSCOPY (EGD) WITH PROPOFOL ;  Surgeon: Rosalie Kitchens, MD;  Location: [facility] ENDOSCOPY;  Service: Gastroenterology;  Laterality: N/A;   ESOPHAGOGASTRODUODENOSCOPY (EGD) WITH PROPOFOL  N/A 04/21/2023   Procedure: ESOPHAGOGASTRODUODENOSCOPY (EGD) WITH PROPOFOL ;  Surgeon: Dianna Specking, MD;  Location: [facility] ENDOSCOPY;  Service: Gastroenterology;  Laterality: N/A;   EYE SURGERY Bilateral    to improve vision after cataract OR   GIVENS CAPSULE STUDY N/A 04/21/2023   Procedure: GIVENS CAPSULE STUDY;  Surgeon: Dianna Specking, MD;  Location: [facility] ENDOSCOPY;  Service: Gastroenterology;  Laterality: N/A;   IR ANGIOGRAM FOLLOW UP STUDY  12/19/2020   IR ANGIOGRAM SELECTIVE EACH ADDITIONAL VESSEL  12/19/2020   IR ANGIOGRAM SELECTIVE EACH ADDITIONAL VESSEL  12/19/2020   IR ANGIOGRAM VISCERAL SELECTIVE  12/19/2020   IR EMBO ART  VEN HEMORR LYMPH EXTRAV  INC GUIDE ROADMAPPING  12/19/2020   IR US  GUIDE VASC ACCESS RIGHT  12/19/2020   JOINT REPLACEMENT     POLYPECTOMY  05/04/2022   Procedure: POLYPECTOMY;  Surgeon: Saintclair Jasper, MD;  Location: [facility]  ENDOSCOPY;  Service: Gastroenterology;;   STAPEDES SURGERY Left    scraped stapedes because it was sticking when it wasn't suppose to   TONSILLECTOMY AND ADENOIDECTOMY  1946   TOTAL KNEE ARTHROPLASTY Left ~ 2008   TOTAL KNEE ARTHROPLASTY  12/20/2011   Procedure: TOTAL KNEE ARTHROPLASTY;  Surgeon: Elspeth JONELLE Her, MD;  Location: [facility] OR;  Service: Orthopedics;  Laterality: Right;  Right Total Knee Arthroplasty   TUBAL LIGATION  1980's    Allergies  Allergen Reactions   Morphine And Codeine Itching   Aspirin Other (See Comments)    Unknown reaction   Duloxetine  Hcl Other (See Comments)    Unknown reaction   Tymlos [Abaloparatide] Other (See Comments)    Unknown reaction   Ventolin  [Albuterol ] Other (See Comments)    rapid heart beat   Cefadroxil Hives    Patient can take amoxicillin  and cipro     Outpatient Encounter Medications as of 07/09/2023  Medication Sig   acetaminophen  (TYLENOL ) 500 MG tablet Take 1,000 mg by mouth every 8 (eight) hours as needed for moderate pain (pain score 4-6).   acetaminophen  (TYLENOL ) 650 MG CR tablet Take 1,300 mg by mouth at bedtime.   albuterol  (VENTOLIN  HFA) 108 (90 Base) MCG/ACT inhaler Inhale 2 puffs into the lungs every 4 (four) hours as needed for wheezing or shortness of breath.   alendronate (FOSAMAX) 70 MG tablet Take 70 mg by  mouth every Sunday. Take with a full glass of water on an empty stomach.   Ascorbic Acid  (VITAMIN C ) 1000 MG tablet Take 1,000 mg by mouth every morning.   cetirizine  (ZYRTEC ) 5 MG chewable tablet Chew 5 mg by mouth daily as needed for allergies.   Cholecalciferol  (VITAMIN D3) 50 MCG (2000 UT) TABS Take 2,000 Units by mouth every morning.   docusate sodium  (COLACE) 100 MG capsule Take 200 mg by mouth at bedtime.   Dorzolamide  HCl-Timolol  Mal PF 2-0.5 % SOLN Place 1 drop into both eyes at bedtime.   ferrous gluconate  (FERGON) 240 (27 FE) MG tablet Take 240 mg by mouth every morning. Take without food   fluticasone   (FLONASE ) 50 MCG/ACT nasal spray Place 1 spray into both nostrils daily as needed for allergies or rhinitis.   guaiFENesin  (ROBITUSSIN) 100 MG/5ML liquid Take 10 mLs by mouth every 4 (four) hours as needed for cough or to loosen phlegm.   latanoprost  (XALATAN ) 0.005 % ophthalmic solution Place 1 drop into both eyes at bedtime.   LIDOCAINE  EX Apply 1 application  topically in the morning and at bedtime. Right shoulder to elbow   LIDOCAINE  EX Apply 1 Application topically daily. Right Ankle.   Multiple Vitamins-Minerals (PRESERVISION AREDS 2) CAPS Take 1 capsule by mouth 2 (two) times daily.   Netarsudil  Dimesylate (RHOPRESSA ) 0.02 % SOLN Place 1 drop into both eyes at bedtime.   oxybutynin  (DITROPAN -XL) 5 MG 24 hr tablet Take 5 mg by mouth at bedtime.   pantoprazole  (PROTONIX ) 40 MG tablet Take 40 mg by mouth 2 (two) times daily.   polyethylene glycol (MIRALAX  / GLYCOLAX ) 17 g packet Take 8.5 g by mouth daily as needed (constipation).   Probiotic Product (ALIGN) 4 MG CAPS Take 4 mg by mouth at bedtime.   rOPINIRole  (REQUIP ) 0.5 MG tablet Take 1 tablet (0.5 mg total) by mouth daily as needed.   sertraline  (ZOLOFT ) 50 MG tablet Take 1.5 tablets (75 mg total) by mouth daily.   simvastatin  (ZOCOR ) 20 MG tablet Take 20 mg by mouth every evening.   Sodium Fluoride  (PREVIDENT 5000 BOOSTER PLUS) 1.1 % PSTE Place 1 Application onto teeth See admin instructions. Brush on teeth with a toothbrush after evening mouth care. Spit out excess and do not rinse.   umeclidinium-vilanterol (ANORO ELLIPTA ) 62.5-25 MCG/ACT AEPB Inhale 1 puff into the lungs every morning.   zinc oxide 20 % ointment Apply 1 Application topically See admin instructions. Apply topically to buttocks after every incontinent episode and as needed for redness   No facility-administered encounter medications on file as of 07/09/2023.    Review of Systems  Constitutional:  Negative for activity change and appetite change.  HENT:  Negative for  sore throat and trouble swallowing.   Eyes:  Positive for visual disturbance.  Respiratory:  Positive for shortness of breath. Negative for cough and wheezing.   Cardiovascular:  Negative for chest pain and leg swelling.  Gastrointestinal:  Positive for blood in stool. Negative for abdominal distention, abdominal pain, constipation, diarrhea, nausea and vomiting.  Genitourinary:  Positive for frequency and urgency. Negative for dysuria and hematuria.  Musculoskeletal:  Positive for arthralgias and gait problem.  Skin:  Negative for wound.  Neurological:  Positive for weakness. Negative for dizziness and light-headedness.  Psychiatric/Behavioral:  Positive for dysphoric mood. Negative for confusion and sleep disturbance. The patient is not nervous/anxious.     Immunization History  Administered Date(s) Administered   Fluad Quad(high Dose 65+) 04/24/2022  Fluad Trivalent(High Dose 65+) 04/27/2023   Influenza, High Dose Seasonal PF 03/14/2018   PFIZER(Purple Top)SARS-COV-2 Vaccination 07/25/2019, 08/16/2019, 12/12/2021   Pneumococcal-Unspecified 03/02/2011   Tdap 03/12/2018   Zoster Recombinant(Shingrix) 10/08/2017, 12/09/2017   Pertinent  Health Maintenance Due  Topic Date Due   DEXA SCAN  Never done   INFLUENZA VACCINE  Completed      05/17/2022   10:19 AM 07/12/2022   11:08 AM 10/30/2022   12:44 PM 01/22/2023    1:15 PM 04/04/2023   11:00 AM  Fall Risk  Falls in the past year? 1 0 0 0 0  Was there an injury with Fall? 1 0 0 0 0  Fall Risk Category Calculator 2 0 0 0 0  Fall Risk Category (Retired) Moderate Low     (RETIRED) Patient Fall Risk Level Moderate fall risk Low fall risk     Patient at Risk for Falls Due to History of fall(s) History of fall(s) History of fall(s);Impaired balance/gait;Impaired mobility History of fall(s);Impaired balance/gait History of fall(s);Impaired balance/gait;Impaired mobility  Fall risk Follow up Falls evaluation completed Falls evaluation  completed Falls evaluation completed;Education provided;Falls prevention discussed Falls evaluation completed;Education provided;Falls prevention discussed Falls evaluation completed;Education provided;Falls prevention discussed   Functional Status Survey:    Vitals:   07/09/23 1558  BP: 111/67  Pulse: 83  Resp: 20  Temp: (!) 97.5 F (36.4 C)  SpO2: 92%  Weight: 177 lb (80.3 kg)  Height: 5' 1 (1.549 m)   Body mass index is 33.44 kg/m. Physical Exam Vitals reviewed.  Constitutional:      General: She is not in acute distress. HENT:     Head: Normocephalic.     Right Ear: There is no impacted cerumen.     Left Ear: There is no impacted cerumen.     Nose: Nose normal.     Mouth/Throat:     Mouth: Mucous membranes are moist.  Eyes:     General:        Right eye: No discharge.        Left eye: No discharge.  Neck:     Comments: Forward neck protrusion Cardiovascular:     Rate and Rhythm: Normal rate and regular rhythm.     Pulses: Normal pulses.     Heart sounds: Normal heart sounds.  Pulmonary:     Effort: Pulmonary effort is normal. No respiratory distress.     Breath sounds: Normal breath sounds. No wheezing or rales.  Abdominal:     General: Bowel sounds are normal.     Palpations: Abdomen is soft.  Musculoskeletal:     Cervical back: Neck supple.     Right lower leg: No edema.     Left lower leg: No edema.  Skin:    General: Skin is warm.     Capillary Refill: Capillary refill takes less than 2 seconds.  Neurological:     General: No focal deficit present.     Mental Status: She is alert and oriented to person, place, and time.     Motor: Weakness present.     Gait: Gait abnormal.     Comments: walker  Psychiatric:        Mood and Affect: Mood normal.     Labs reviewed: Recent Labs    04/25/23 0322 04/27/23 0629 04/29/23 0541 05/09/23 0000  NA 142 140 141 143  K 3.6 3.8 3.7 4.2  CL 112* 106 106 107  CO2 24 27 26  26*  GLUCOSE 108*  120* 108*   --   BUN 6* 10 8 11   CREATININE 0.44 0.48 0.52 0.6  CALCIUM  8.2* 8.6* 8.7* 9.1   Recent Labs    03/29/23 0843 04/03/23 0000 04/19/23 1144 04/20/23 0739  AST 16 14 16 16   ALT 10 9 9 9   ALKPHOS 40 48 31* 27*  BILITOT 0.6  --  0.6 1.1  PROT 6.7  --  5.8* 5.0*  ALBUMIN 3.8 4.2 3.4* 3.0*   Recent Labs    04/03/23 0000 04/19/23 1144 04/19/23 1227 04/26/23 1025 04/27/23 0629 04/29/23 0630  WBC 5.7 7.9   < > 5.7 5.5 5.2  NEUTROABS 33,335.00 5.3  --  3.7  --   --   HGB 10.8* 5.9*   < > 8.8* 8.4* 8.0*  HCT 34* 19.2*   < > 28.8* 27.2* 25.6*  MCV  --  103.8*   < > 99.3 98.6 97.3  PLT 195 154   < > 196 197 189   < > = values in this interval not displayed.   Lab Results  Component Value Date   TSH 2.47 10/08/2022   No results found for: HGBA1C Lab Results  Component Value Date   CHOL 147 02/18/2022   HDL 51 02/18/2022   LDLCALC 72 02/18/2022   TRIG 162 (A) 02/18/2022    Significant Diagnostic Results in last 30 days:  No results found.  Assessment/Plan 1. Collagenous colitis (Primary) - 10/25 colonoscopy showed moderate proximal colonic ischemic mucosa, biopsies taken> confirmed collagenous colitis - has not seen [facility] - consider budesonide  in future  2. History of GI diverticular bleed - multiple hospitalizations within past 2 years - see above - cont Protonix   3. Blood loss anemia - last hgb 8.0 04/29/2023 - cont ferrous gluconate  - cbc/diff  4. OAB (overactive bladder) - ongoing - recently treated for acute cystitis> E.coli> resolved with Cipro  x 5 days - will increase oxybutynin  to 10 mg   5. COPD mixed type (HCC) - h/o smoking - using oxygen  at night - cont albuterol  and anoro ellipta   6. Pure hypercholesterolemia - LDL 72 2023 - cont simvastatin  - lipid panel  7. Recurrent depression (HCC) - PHQ improved from 7 to 5 - Na+ stable - no mood changes - cont Zoloft   8. Primary osteoarthritis involving multiple joints - cont tylenol     9. Restless legs syndrome (RLS) - cont ropinirole   10. Senile osteoporosis - DEXA ordered but not scheduled - cont Fosamax    Family/ staff Communication: plan discussed with patient and nurse  Labs/tests ordered:  cbc/diff, lipid panel 01/13

## 2023-07-14 DIAGNOSIS — R1312 Dysphagia, oropharyngeal phase: Secondary | ICD-10-CM | POA: Diagnosis not present

## 2023-07-14 DIAGNOSIS — R41841 Cognitive communication deficit: Secondary | ICD-10-CM | POA: Diagnosis not present

## 2023-07-14 DIAGNOSIS — E785 Hyperlipidemia, unspecified: Secondary | ICD-10-CM | POA: Diagnosis not present

## 2023-07-14 DIAGNOSIS — R197 Diarrhea, unspecified: Secondary | ICD-10-CM | POA: Diagnosis not present

## 2023-07-14 DIAGNOSIS — D519 Vitamin B12 deficiency anemia, unspecified: Secondary | ICD-10-CM | POA: Diagnosis not present

## 2023-07-14 LAB — CBC AND DIFFERENTIAL
HCT: 37 (ref 36–46)
Hemoglobin: 11.7 — AB (ref 12.0–16.0)
Neutrophils Absolute: 2640
Platelets: 156 10*3/uL (ref 150–400)
WBC: 4.6

## 2023-07-14 LAB — LIPID PANEL
Cholesterol: 125 (ref 0–200)
HDL: 51 (ref 35–70)
LDL Cholesterol: 53
Triglycerides: 130 (ref 40–160)

## 2023-07-14 LAB — CBC: RBC: 3.9 (ref 3.87–5.11)

## 2023-07-14 LAB — VITAMIN B12: Vitamin B-12: 373

## 2023-07-16 DIAGNOSIS — Z961 Presence of intraocular lens: Secondary | ICD-10-CM | POA: Diagnosis not present

## 2023-07-16 DIAGNOSIS — H401132 Primary open-angle glaucoma, bilateral, moderate stage: Secondary | ICD-10-CM | POA: Diagnosis not present

## 2023-07-17 DIAGNOSIS — R1312 Dysphagia, oropharyngeal phase: Secondary | ICD-10-CM | POA: Diagnosis not present

## 2023-07-17 DIAGNOSIS — R41841 Cognitive communication deficit: Secondary | ICD-10-CM | POA: Diagnosis not present

## 2023-07-22 DIAGNOSIS — R41841 Cognitive communication deficit: Secondary | ICD-10-CM | POA: Diagnosis not present

## 2023-07-22 DIAGNOSIS — R1312 Dysphagia, oropharyngeal phase: Secondary | ICD-10-CM | POA: Diagnosis not present

## 2023-07-23 DIAGNOSIS — R41841 Cognitive communication deficit: Secondary | ICD-10-CM | POA: Diagnosis not present

## 2023-07-23 DIAGNOSIS — R1312 Dysphagia, oropharyngeal phase: Secondary | ICD-10-CM | POA: Diagnosis not present

## 2023-07-25 ENCOUNTER — Encounter: Payer: Self-pay | Admitting: Orthopedic Surgery

## 2023-07-25 ENCOUNTER — Non-Acute Institutional Stay: Payer: Self-pay | Admitting: Orthopedic Surgery

## 2023-07-25 DIAGNOSIS — R0609 Other forms of dyspnea: Secondary | ICD-10-CM | POA: Diagnosis not present

## 2023-07-25 DIAGNOSIS — R197 Diarrhea, unspecified: Secondary | ICD-10-CM | POA: Diagnosis not present

## 2023-07-25 NOTE — Progress Notes (Signed)
Location:  [facility] Room Number: 20/A Place of Service:  ALF [phone]) Provider:  Octavia Heir, NP   Octavia Heir, NP  Patient Care Team: Octavia Heir, NP as PCP - General (Adult Health Nurse Practitioner) [name], MD as Consulting Physician (Internal Medicine)  Extended Emergency Contact Information Primary Emergency Contact: Hamre,[name] Address: [address]          [place], [place] [address] [name] Home Phone: [phone] Mobile Phone: [phone] Relation: Spouse Secondary Emergency Contact: Visscher MD,[name] Jethro Poling Phone: [phone] Relation: Son Interpreter needed? No  Code Status:  DNR Goals of care: Advanced Directive information    06/09/2023    3:33 PM  Advanced Directives  Does Patient Have a Medical Advance Directive? Yes  Type of Advance Directive Out of facility DNR (pink MOST or yellow form)  Does patient want to make changes to medical advance directive? No - Patient declined     Chief Complaint  Patient presents with   Acute Visit    Shortness of breath, diarrhea    HPI:  Pt is a 83 y.o. female seen today for acute visit due to shortness of breath and diarrhea.   She currently resides on the assisted living unit at [facility]. PMH: HTN, COPD, pulmonary nodule, Diverticulosis with GI bleed, GERD, OA, osteoporosis,macular degeneration, iron def anemia, unstable gait, urinary incontinence, anxiety, and depression.   She reports increased shortness of breath while working with PT this morning. H/o COPD and acute respiratory failure. She wear oxygen at bedtime prn. No cold symptoms. Denies chest pain. O2 sat 90% on room air with exertion and 94% at rest. Afebrile. Vitals stable.   She had one episode of diarrhea yesterday. Unable to give characteristics due to poor vision. She reports amount was large. H/o diverticular bleed with hospitalization 10/19-10/29. 01/08 Hgb 11.7 07/24/2022. Remains on  Protonix. No episodes of diarrhea today.   Past Medical History:  Diagnosis Date   Anxiety    Arthritis    "bilateral knees, shoulders, elbows; neck, pretty widespread" (09/05/2017)   BPPV (benign paroxysmal positional vertigo)    Depression    GERD (gastroesophageal reflux disease)    Glaucoma, both eyes    Headache    "probably 2/month" (09/05/2017)   History of blood transfusion ~ 2008   "related to LGIB"   Hyperlipemia    Lower GI bleeding ~ 2008; 09/05/2017   "had to have blood transfusion"   Macular degeneration, bilateral    Osteopenia    Seasonal allergies    Skin cancer, basal cell 2001   "off my nose, left side"   Sleeping excessive    Tinnitus of both ears    Past Surgical History:  Procedure Laterality Date   BALLOON DILATION N/A 05/08/2021   Procedure: BALLOON DILATION;  Surgeon: Kerin Salen, MD;  Location: [facility] ENDOSCOPY;  Service: Gastroenterology;  Laterality: N/A;   BASAL CELL CARCINOMA EXCISION  2001   "off my nose, left side"   BIOPSY  05/08/2021   Procedure: BIOPSY;  Surgeon: Kerin Salen, MD;  Location: [facility] ENDOSCOPY;  Service: Gastroenterology;;   BIOPSY  04/25/2023   Procedure: BIOPSY;  Surgeon: Charlott Rakes, MD;  Location: [facility] ENDOSCOPY;  Service: Gastroenterology;;   BLEPHAROPLASTY Bilateral    CATARACT EXTRACTION W/ INTRAOCULAR LENS  IMPLANT, BILATERAL Bilateral 1990's   COLONOSCOPY WITH PROPOFOL N/A 05/04/2022   Procedure: COLONOSCOPY WITH PROPOFOL;  Surgeon: Kerin Salen, MD;  Location: [facility] ENDOSCOPY;  Service:  Gastroenterology;  Laterality: N/A;   COLONOSCOPY WITH PROPOFOL N/A 04/25/2023   Procedure: COLONOSCOPY WITH PROPOFOL;  Surgeon: Charlott Rakes, MD;  Location: [facility] ENDOSCOPY;  Service: Gastroenterology;  Laterality: N/A;   ESOPHAGOGASTRODUODENOSCOPY (EGD) WITH PROPOFOL N/A 05/08/2021   Procedure: ESOPHAGOGASTRODUODENOSCOPY (EGD) WITH PROPOFOL;  Surgeon: Kerin Salen, MD;  Location: [facility] ENDOSCOPY;  Service: Gastroenterology;  Laterality: N/A;    ESOPHAGOGASTRODUODENOSCOPY (EGD) WITH PROPOFOL N/A 01/11/2022   Procedure: ESOPHAGOGASTRODUODENOSCOPY (EGD) WITH PROPOFOL;  Surgeon: Vida Rigger, MD;  Location: [facility] ENDOSCOPY;  Service: Gastroenterology;  Laterality: N/A;   ESOPHAGOGASTRODUODENOSCOPY (EGD) WITH PROPOFOL N/A 01/06/2023   Procedure: ESOPHAGOGASTRODUODENOSCOPY (EGD) WITH PROPOFOL;  Surgeon: Vida Rigger, MD;  Location: [facility] ENDOSCOPY;  Service: Gastroenterology;  Laterality: N/A;   ESOPHAGOGASTRODUODENOSCOPY (EGD) WITH PROPOFOL N/A 04/21/2023   Procedure: ESOPHAGOGASTRODUODENOSCOPY (EGD) WITH PROPOFOL;  Surgeon: Charlott Rakes, MD;  Location: [facility] ENDOSCOPY;  Service: Gastroenterology;  Laterality: N/A;   EYE SURGERY Bilateral    "to improve vision after cataract OR"   GIVENS CAPSULE STUDY N/A 04/21/2023   Procedure: GIVENS CAPSULE STUDY;  Surgeon: Charlott Rakes, MD;  Location: [facility] ENDOSCOPY;  Service: Gastroenterology;  Laterality: N/A;   IR ANGIOGRAM FOLLOW UP STUDY  12/19/2020   IR ANGIOGRAM SELECTIVE EACH ADDITIONAL VESSEL  12/19/2020   IR ANGIOGRAM SELECTIVE EACH ADDITIONAL VESSEL  12/19/2020   IR ANGIOGRAM VISCERAL SELECTIVE  12/19/2020   IR EMBO ART  VEN HEMORR LYMPH EXTRAV  INC GUIDE ROADMAPPING  12/19/2020   IR US GUIDE VASC ACCESS RIGHT  12/19/2020   JOINT REPLACEMENT     POLYPECTOMY  05/04/2022   Procedure: POLYPECTOMY;  Surgeon: Kerin Salen, MD;  Location: [facility] ENDOSCOPY;  Service: Gastroenterology;;   STAPEDES SURGERY Left    "scraped stapedes because it was sticking when it wasn't suppose to"   TONSILLECTOMY AND ADENOIDECTOMY  1946   TOTAL KNEE ARTHROPLASTY Left ~ 2008   TOTAL KNEE ARTHROPLASTY  12/20/2011   Procedure: TOTAL KNEE ARTHROPLASTY;  Surgeon: Verlee Rossetti, MD;  Location: [facility] OR;  Service: Orthopedics;  Laterality: Right;  Right Total Knee Arthroplasty   TUBAL LIGATION  1980's    Allergies  Allergen Reactions   Morphine And Codeine Itching   Aspirin Other (See Comments)    Unknown reaction   Duloxetine Hcl  Other (See Comments)    Unknown reaction   Tymlos [Abaloparatide] Other (See Comments)    Unknown reaction   Ventolin [Albuterol] Other (See Comments)    rapid heart beat   Cefadroxil Hives    Patient can take amoxicillin and cipro    Outpatient Encounter Medications as of 07/25/2023  Medication Sig   acetaminophen (TYLENOL) 500 MG tablet Take 1,000 mg by mouth every 8 (eight) hours as needed for moderate pain (pain score 4-6).   acetaminophen (TYLENOL) 650 MG CR tablet Take 1,300 mg by mouth at bedtime.   albuterol (VENTOLIN HFA) 108 (90 Base) MCG/ACT inhaler Inhale 2 puffs into the lungs every 4 (four) hours as needed for wheezing or shortness of breath.   alendronate (FOSAMAX) 70 MG tablet Take 70 mg by mouth every Sunday. Take with a full glass of water on an empty stomach.   Ascorbic Acid (VITAMIN C) 1000 MG tablet Take 1,000 mg by mouth every morning.   cetirizine (ZYRTEC) 5 MG chewable tablet Chew 5 mg by mouth daily as needed for allergies.   Cholecalciferol (VITAMIN D3) 50 MCG (2000 UT) TABS Take 2,000 Units by mouth every morning.   docusate sodium (COLACE) 100 MG capsule Take 200 mg  by mouth at bedtime.   Dorzolamide HCl-Timolol Mal PF 2-0.5 % SOLN Place 1 drop into both eyes at bedtime.   ferrous gluconate (FERGON) 240 (27 FE) MG tablet Take 240 mg by mouth every morning. Take without food   fluticasone (FLONASE) 50 MCG/ACT nasal spray Place 1 spray into both nostrils daily as needed for allergies or rhinitis.   guaiFENesin (ROBITUSSIN) 100 MG/5ML liquid Take 10 mLs by mouth every 4 (four) hours as needed for cough or to loosen phlegm.   latanoprost (XALATAN) 0.005 % ophthalmic solution Place 1 drop into both eyes at bedtime.   LIDOCAINE EX Apply 1 application  topically in the morning and at bedtime. Right shoulder to elbow   LIDOCAINE EX Apply 1 Application topically daily. Right Ankle.   Multiple Vitamins-Minerals (PRESERVISION AREDS 2) CAPS Take 1 capsule by mouth 2 (two)  times daily.   Netarsudil Dimesylate (RHOPRESSA) 0.02 % SOLN Place 1 drop into both eyes at bedtime.   oxybutynin (DITROPAN-XL) 5 MG 24 hr tablet Take 5 mg by mouth at bedtime.   pantoprazole (PROTONIX) 40 MG tablet Take 40 mg by mouth 2 (two) times daily.   polyethylene glycol (MIRALAX / GLYCOLAX) 17 g packet Take 8.5 g by mouth daily as needed (constipation).   Probiotic Product (ALIGN) 4 MG CAPS Take 4 mg by mouth at bedtime.   rOPINIRole (REQUIP) 0.5 MG tablet Take 1 tablet (0.5 mg total) by mouth daily as needed.   sertraline (ZOLOFT) 50 MG tablet Take 1.5 tablets (75 mg total) by mouth daily.   simvastatin (ZOCOR) 20 MG tablet Take 20 mg by mouth every evening.   Sodium Fluoride (PREVIDENT 5000 BOOSTER PLUS) 1.1 % PSTE Place 1 Application onto teeth See admin instructions. Brush on teeth with a toothbrush after evening mouth care. Spit out excess and do not rinse.   umeclidinium-vilanterol (ANORO ELLIPTA) 62.5-25 MCG/ACT AEPB Inhale 1 puff into the lungs every morning.   zinc oxide 20 % ointment Apply 1 Application topically See admin instructions. Apply topically to buttocks after every incontinent episode and as needed for redness   No facility-administered encounter medications on file as of 07/25/2023.    Review of Systems  Constitutional:  Positive for fatigue. Negative for fever.  HENT:  Negative for congestion and sore throat.   Respiratory:  Positive for shortness of breath. Negative for cough and wheezing.   Cardiovascular:  Positive for leg swelling. Negative for chest pain.  Gastrointestinal:  Positive for diarrhea. Negative for abdominal pain, blood in stool and constipation.  Genitourinary:  Negative for dysuria.  Musculoskeletal:  Positive for gait problem.  Neurological:  Positive for weakness. Negative for dizziness and light-headedness.  Psychiatric/Behavioral:  Negative for dysphoric mood. The patient is not nervous/anxious.     Immunization History  Administered  Date(s) Administered   Fluad Quad(high Dose 65+) 04/24/2022   Fluad Trivalent(High Dose 65+) 04/27/2023   Influenza, High Dose Seasonal PF 03/14/2018   PFIZER(Purple Top)SARS-COV-2 Vaccination 07/25/2019, 08/16/2019, 12/12/2021   Pneumococcal-Unspecified 03/02/2011   Tdap 03/12/2018   Zoster Recombinant(Shingrix) 10/08/2017, 12/09/2017   Pertinent  Health Maintenance Due  Topic Date Due   DEXA SCAN  Never done   INFLUENZA VACCINE  Completed      07/12/2022   11:08 AM 10/30/2022   12:44 PM 01/22/2023    1:15 PM 04/04/2023   11:00 AM 07/10/2023    9:02 AM  Fall Risk  Falls in the past year? 0 0 0 0 0  Was there an  injury with Fall? 0 0 0 0 0  Fall Risk Category Calculator 0 0 0 0 0  Fall Risk Category (Retired) Low      (RETIRED) Patient Fall Risk Level Low fall risk      Patient at Risk for Falls Due to History of fall(s) History of fall(s);Impaired balance/gait;Impaired mobility History of fall(s);Impaired balance/gait History of fall(s);Impaired balance/gait;Impaired mobility History of fall(s);Impaired balance/gait;Impaired mobility  Fall risk Follow up Falls evaluation completed Falls evaluation completed;Education provided;Falls prevention discussed Falls evaluation completed;Education provided;Falls prevention discussed Falls evaluation completed;Education provided;Falls prevention discussed Falls evaluation completed;Education provided;Falls prevention discussed   Functional Status Survey:    Vitals:   07/25/23 1249  BP: 114/71  Pulse: 78  Resp: 19  Temp: 98.2 F (36.8 C)  SpO2: 90%  Weight: 177 lb (80.3 kg)  Height: 5\' 1"  (1.549 m)   Body mass index is 33.44 kg/m. Physical Exam Vitals reviewed.  Constitutional:      General: She is not in acute distress. HENT:     Head: Normocephalic.  Eyes:     General:        Right eye: No discharge.        Left eye: No discharge.  Cardiovascular:     Rate and Rhythm: Normal rate and regular rhythm.     Pulses: Normal  pulses.     Heart sounds: Normal heart sounds.  Pulmonary:     Effort: Pulmonary effort is normal. No respiratory distress.     Breath sounds: Normal breath sounds. No wheezing.  Abdominal:     General: Bowel sounds are normal.     Palpations: Abdomen is soft.  Musculoskeletal:     Cervical back: Neck supple.     Right lower leg: Edema present.     Left lower leg: Edema present.     Comments: Non pitting, compression stocking on  Skin:    General: Skin is warm.     Capillary Refill: Capillary refill takes less than 2 seconds.  Neurological:     General: No focal deficit present.     Mental Status: She is alert. Mental status is at baseline.     Motor: Weakness present.     Gait: Gait abnormal.     Comments: walker  Psychiatric:        Mood and Affect: Mood normal.     Labs reviewed: Recent Labs    04/25/23 0322 04/27/23 0629 04/29/23 0541 05/09/23 0000  NA 142 140 141 143  K 3.6 3.8 3.7 4.2  CL 112* 106 106 107  CO2 24 27 26  26*  GLUCOSE 108* 120* 108*  --   BUN 6* 10 8 11   CREATININE 0.44 0.48 0.52 0.6  CALCIUM 8.2* 8.6* 8.7* 9.1   Recent Labs    03/29/23 0843 04/03/23 0000 04/19/23 1144 04/20/23 0739  AST 16 14 16 16   ALT 10 9 9 9   ALKPHOS 40 48 31* 27*  BILITOT 0.6  --  0.6 1.1  PROT 6.7  --  5.8* 5.0*  ALBUMIN 3.8 4.2 3.4* 3.0*   Recent Labs    04/03/23 0000 04/19/23 1144 04/19/23 1227 04/26/23 1025 04/27/23 0629 04/29/23 0630  WBC 5.7 7.9   < > 5.7 5.5 5.2  NEUTROABS 33,335.00 5.3  --  3.7  --   --   HGB 10.8* 5.9*   < > 8.8* 8.4* 8.0*  HCT 34* 19.2*   < > 28.8* 27.2* 25.6*  MCV  --  103.8*   < >  99.3 98.6 97.3  PLT 195 154   < > 196 197 189   < > = values in this interval not displayed.   Lab Results  Component Value Date   TSH 2.47 10/08/2022   No results found for: "HGBA1C" Lab Results  Component Value Date   CHOL 147 02/18/2022   HDL 51 02/18/2022   LDLCALC 72 02/18/2022   TRIG 162 (A) 02/18/2022    Significant Diagnostic  Results in last 30 days:  No results found.  Assessment/Plan 1. Dyspnea on exertion (Primary) - occurred with PT session today - O2 sat 90% on RA, 94% at rest - lung sounds clear - suspect related to deconditioning and advanced age - h/o COPD and acute respiratory failure - cont oxygen at bedtime prn  2. Diarrhea, unspecified type - large episode yesterday - unable to describe due to poor vision> reported as large amount - 01/08 hgb 11.7 - h/o diverticular bleed> plan to recheck hgb - stat cbc/diff    Family/ staff Communication: plan discussed with patient and nurse  Labs/tests ordered:  stat cbc/diff

## 2023-07-26 DIAGNOSIS — D649 Anemia, unspecified: Secondary | ICD-10-CM | POA: Diagnosis not present

## 2023-07-29 DIAGNOSIS — R1312 Dysphagia, oropharyngeal phase: Secondary | ICD-10-CM | POA: Diagnosis not present

## 2023-07-29 DIAGNOSIS — R41841 Cognitive communication deficit: Secondary | ICD-10-CM | POA: Diagnosis not present

## 2023-07-31 DIAGNOSIS — R41841 Cognitive communication deficit: Secondary | ICD-10-CM | POA: Diagnosis not present

## 2023-07-31 DIAGNOSIS — R1312 Dysphagia, oropharyngeal phase: Secondary | ICD-10-CM | POA: Diagnosis not present

## 2023-08-04 ENCOUNTER — Encounter: Payer: Self-pay | Admitting: Orthopedic Surgery

## 2023-08-04 ENCOUNTER — Non-Acute Institutional Stay: Payer: Self-pay | Admitting: Orthopedic Surgery

## 2023-08-04 DIAGNOSIS — D500 Iron deficiency anemia secondary to blood loss (chronic): Secondary | ICD-10-CM | POA: Diagnosis not present

## 2023-08-04 DIAGNOSIS — M79661 Pain in right lower leg: Secondary | ICD-10-CM | POA: Diagnosis not present

## 2023-08-04 DIAGNOSIS — D126 Benign neoplasm of colon, unspecified: Secondary | ICD-10-CM

## 2023-08-04 DIAGNOSIS — M25571 Pain in right ankle and joints of right foot: Secondary | ICD-10-CM | POA: Diagnosis not present

## 2023-08-04 DIAGNOSIS — K52831 Collagenous colitis: Secondary | ICD-10-CM | POA: Diagnosis not present

## 2023-08-04 DIAGNOSIS — M7989 Other specified soft tissue disorders: Secondary | ICD-10-CM | POA: Diagnosis not present

## 2023-08-04 DIAGNOSIS — D649 Anemia, unspecified: Secondary | ICD-10-CM | POA: Diagnosis not present

## 2023-08-04 NOTE — Progress Notes (Signed)
Location:   [facility]  Nursing Home Room Number: 20-A Place of Service:  [phone]) Provider:  Hazle Nordmann, NP  Octavia Heir, NP  Patient Care Team: Octavia Heir, NP as PCP - General (Adult Health Nurse Practitioner) [name], MD as Consulting Physician (Internal Medicine)  Extended Emergency Contact Information Primary Emergency Contact: Brees,[name] Address: [address]          [place], [place] [address] [name] Home Phone: [phone] Mobile Phone: [phone] Relation: Spouse Secondary Emergency Contact: Jeremiah MD,[name] Jethro Poling Phone: [phone] Relation: Son Interpreter needed? No  Code Status:  DNR Goals of care: Advanced Directive information    08/04/2023    2:43 PM  Advanced Directives  Does Patient Have a Medical Advance Directive? Yes  Type of Advance Directive Out of facility DNR (pink MOST or yellow form)  Does patient want to make changes to medical advance directive? No - Patient declined     Chief Complaint  Patient presents with   Acute Visit    Left ankle pain    HPI:  Pt is a 83 y.o. female seen today for acute visit due to right lower leg pain.   She currently resides on the assisted living unit at [facility]. PMH: HTN, COPD, pulmonary nodule, Diverticulosis with GI bleed, GERD, OA, osteoporosis,macular degeneration, iron def anemia, unstable gait, urinary incontinence, anxiety, and depression.   She started experiencing right lower leg pain about three days ago. The pain is mild and present at rest. No recent fall or injury. Today, her right leg is more swollen than her left and is warm to touch. She notes redness in the right lower leg but denies any skin abrasions. Apparently left leg was more bothersome a few days ago and xray was ordered. Left ankle xray was negative for acute fracture or dislocation. WBAT today. She is not currently taking any medication for the leg pain. Denies chest pain and  shortness of breath. Afebrile. Vitals stable.   She has a history of osteoarthritis affecting multiple joints, with the knees and neck being the most bothersome. She is not on any anticoagulants due to multiple hospitalizations in the past two years for diverticular bleeding.  Her recent colonoscopy on April 25, 2023, showed moderate proximal colonic ischemic mucosa, and biopsies confirmed cholangitis colitis. She is followed by a gastroenterologist and remains on Protonix.  She is also taking ferrous gluconate for blood loss anemia related to recurrent GI bleeds. A stat CBC with differential was done this morning, revealing a white blood cell count of 3.78 and a hemoglobin level of 11.6.  Past Medical History:  Diagnosis Date   Anxiety    Arthritis    "bilateral knees, shoulders, elbows; neck, pretty widespread" (09/05/2017)   BPPV (benign paroxysmal positional vertigo)    Depression    GERD (gastroesophageal reflux disease)    Glaucoma, both eyes    Headache    "probably 2/month" (09/05/2017)   History of blood transfusion ~ 2008   "related to LGIB"   Hyperlipemia    Lower GI bleeding ~ 2008; 09/05/2017   "had to have blood transfusion"   Macular degeneration, bilateral    Osteopenia    Seasonal allergies    Skin cancer, basal cell 2001   "off my nose, left side"   Sleeping excessive    Tinnitus of both ears    Past Surgical History:  Procedure Laterality Date   BALLOON DILATION N/A 05/08/2021  Procedure: BALLOON DILATION;  Surgeon: Kerin Salen, MD;  Location: [facility] ENDOSCOPY;  Service: Gastroenterology;  Laterality: N/A;   BASAL CELL CARCINOMA EXCISION  2001   "off my nose, left side"   BIOPSY  05/08/2021   Procedure: BIOPSY;  Surgeon: Kerin Salen, MD;  Location: [facility] ENDOSCOPY;  Service: Gastroenterology;;   BIOPSY  04/25/2023   Procedure: BIOPSY;  Surgeon: Charlott Rakes, MD;  Location: [facility] ENDOSCOPY;  Service: Gastroenterology;;   BLEPHAROPLASTY Bilateral    CATARACT  EXTRACTION W/ INTRAOCULAR LENS  IMPLANT, BILATERAL Bilateral 1990's   COLONOSCOPY WITH PROPOFOL N/A 05/04/2022   Procedure: COLONOSCOPY WITH PROPOFOL;  Surgeon: Kerin Salen, MD;  Location: [facility] ENDOSCOPY;  Service: Gastroenterology;  Laterality: N/A;   COLONOSCOPY WITH PROPOFOL N/A 04/25/2023   Procedure: COLONOSCOPY WITH PROPOFOL;  Surgeon: Charlott Rakes, MD;  Location: [facility] ENDOSCOPY;  Service: Gastroenterology;  Laterality: N/A;   ESOPHAGOGASTRODUODENOSCOPY (EGD) WITH PROPOFOL N/A 05/08/2021   Procedure: ESOPHAGOGASTRODUODENOSCOPY (EGD) WITH PROPOFOL;  Surgeon: Kerin Salen, MD;  Location: [facility] ENDOSCOPY;  Service: Gastroenterology;  Laterality: N/A;   ESOPHAGOGASTRODUODENOSCOPY (EGD) WITH PROPOFOL N/A 01/11/2022   Procedure: ESOPHAGOGASTRODUODENOSCOPY (EGD) WITH PROPOFOL;  Surgeon: Vida Rigger, MD;  Location: [facility] ENDOSCOPY;  Service: Gastroenterology;  Laterality: N/A;   ESOPHAGOGASTRODUODENOSCOPY (EGD) WITH PROPOFOL N/A 01/06/2023   Procedure: ESOPHAGOGASTRODUODENOSCOPY (EGD) WITH PROPOFOL;  Surgeon: Vida Rigger, MD;  Location: [facility] ENDOSCOPY;  Service: Gastroenterology;  Laterality: N/A;   ESOPHAGOGASTRODUODENOSCOPY (EGD) WITH PROPOFOL N/A 04/21/2023   Procedure: ESOPHAGOGASTRODUODENOSCOPY (EGD) WITH PROPOFOL;  Surgeon: Charlott Rakes, MD;  Location: [facility] ENDOSCOPY;  Service: Gastroenterology;  Laterality: N/A;   EYE SURGERY Bilateral    "to improve vision after cataract OR"   GIVENS CAPSULE STUDY N/A 04/21/2023   Procedure: GIVENS CAPSULE STUDY;  Surgeon: Charlott Rakes, MD;  Location: [facility] ENDOSCOPY;  Service: Gastroenterology;  Laterality: N/A;   IR ANGIOGRAM FOLLOW UP STUDY  12/19/2020   IR ANGIOGRAM SELECTIVE EACH ADDITIONAL VESSEL  12/19/2020   IR ANGIOGRAM SELECTIVE EACH ADDITIONAL VESSEL  12/19/2020   IR ANGIOGRAM VISCERAL SELECTIVE  12/19/2020   IR EMBO ART  VEN HEMORR LYMPH EXTRAV  INC GUIDE ROADMAPPING  12/19/2020   IR US GUIDE VASC ACCESS RIGHT  12/19/2020   JOINT REPLACEMENT      POLYPECTOMY  05/04/2022   Procedure: POLYPECTOMY;  Surgeon: Kerin Salen, MD;  Location: [facility] ENDOSCOPY;  Service: Gastroenterology;;   STAPEDES SURGERY Left    "scraped stapedes because it was sticking when it wasn't suppose to"   TONSILLECTOMY AND ADENOIDECTOMY  1946   TOTAL KNEE ARTHROPLASTY Left ~ 2008   TOTAL KNEE ARTHROPLASTY  12/20/2011   Procedure: TOTAL KNEE ARTHROPLASTY;  Surgeon: Verlee Rossetti, MD;  Location: [facility] OR;  Service: Orthopedics;  Laterality: Right;  Right Total Knee Arthroplasty   TUBAL LIGATION  1980's    Allergies  Allergen Reactions   Morphine And Codeine Itching   Aspirin Other (See Comments)    Unknown reaction   Duloxetine Hcl Other (See Comments)    Unknown reaction   Tymlos [Abaloparatide] Other (See Comments)    Unknown reaction   Ventolin [Albuterol] Other (See Comments)    rapid heart beat   Cefadroxil Hives    Patient can take amoxicillin and cipro    Allergies as of 08/04/2023       Reactions   Morphine And Codeine Itching   Aspirin Other (See Comments)   Unknown reaction   Duloxetine Hcl Other (See Comments)   Unknown reaction   Tymlos [abaloparatide] Other (See Comments)  Unknown reaction   Ventolin [albuterol] Other (See Comments)   rapid heart beat   Cefadroxil Hives   Patient can take amoxicillin and cipro        Medication List        Accurate as of August 04, 2023  2:44 PM. If you have any questions, ask your nurse or doctor.          STOP taking these medications    LIDOCAINE EX Stopped by: Shanyah Gattuso E Anam Bobby       TAKE these medications    acetaminophen 650 MG CR tablet Commonly known as: TYLENOL Take 1,300 mg by mouth at bedtime.   acetaminophen 500 MG tablet Commonly known as: TYLENOL Take 1,000 mg by mouth every 8 (eight) hours as needed for moderate pain (pain score 4-6).   acetaminophen 500 MG tablet Commonly known as: TYLENOL Take 1,000 mg by mouth in the morning and at bedtime.   albuterol 108 (90  Base) MCG/ACT inhaler Commonly known as: VENTOLIN HFA Inhale 2 puffs into the lungs every 4 (four) hours as needed for wheezing or shortness of breath.   alendronate 70 MG tablet Commonly known as: FOSAMAX Take 70 mg by mouth every Sunday. Take with a full glass of water on an empty stomach.   Align 4 MG Caps Take 4 mg by mouth at bedtime.   Anoro Ellipta 62.5-25 MCG/ACT Aepb Generic drug: umeclidinium-vilanterol Inhale 1 puff into the lungs every morning.   cetirizine 5 MG chewable tablet Commonly known as: ZYRTEC Chew 5 mg by mouth daily as needed for allergies.   docusate sodium 100 MG capsule Commonly known as: COLACE Take 200 mg by mouth at bedtime.   Dorzolamide HCl-Timolol Mal PF 2-0.5 % Soln Place 1 drop into both eyes in the morning and at bedtime.   ferrous gluconate 240 (27 FE) MG tablet Commonly known as: FERGON Take 240 mg by mouth every morning. Take without food   fluticasone 50 MCG/ACT nasal spray Commonly known as: FLONASE Place 1 spray into both nostrils daily as needed for allergies or rhinitis.   guaiFENesin 100 MG/5ML liquid Commonly known as: ROBITUSSIN Take 10 mLs by mouth every 4 (four) hours as needed for cough or to loosen phlegm.   latanoprost 0.005 % ophthalmic solution Commonly known as: XALATAN Place 1 drop into both eyes at bedtime.   oxybutynin 5 MG 24 hr tablet Commonly known as: DITROPAN-XL Take 5 mg by mouth at bedtime.   pantoprazole 40 MG tablet Commonly known as: PROTONIX Take 40 mg by mouth 2 (two) times daily.   polyethylene glycol 17 g packet Commonly known as: MIRALAX / GLYCOLAX Take 8.5 g by mouth daily as needed (constipation).   PreserVision AREDS 2 Caps Take 1 capsule by mouth 2 (two) times daily.   PreviDent 5000 Booster Plus 1.1 % Pste Generic drug: Sodium Fluoride Place 1 Application onto teeth See admin instructions. Brush on teeth with a toothbrush after evening mouth care. Spit out excess and do not  rinse.   Rhopressa 0.02 % Soln Generic drug: Netarsudil Dimesylate Place 1 drop into both eyes at bedtime.   rOPINIRole 0.5 MG tablet Commonly known as: REQUIP Take 1 tablet (0.5 mg total) by mouth daily as needed.   sertraline 50 MG tablet Commonly known as: ZOLOFT Take 1.5 tablets (75 mg total) by mouth daily.   simvastatin 20 MG tablet Commonly known as: ZOCOR Take 20 mg by mouth every evening.   vitamin C 1000 MG tablet  Take 1,000 mg by mouth every morning.   Vitamin D3 50 MCG (2000 UT) Tabs Take 2,000 Units by mouth every morning.   zinc oxide 20 % ointment Apply 1 Application topically See admin instructions. Apply topically to buttocks after every incontinent episode and as needed for redness        Review of Systems  Constitutional:  Negative for fever.  Cardiovascular:  Positive for leg swelling. Negative for chest pain.  Gastrointestinal:  Negative for abdominal pain, anal bleeding and blood in stool.  Skin:  Positive for color change. Negative for wound.  Psychiatric/Behavioral:  Negative for dysphoric mood. The patient is not nervous/anxious.     Immunization History  Administered Date(s) Administered   Fluad Quad(high Dose 65+) 04/24/2022   Fluad Trivalent(High Dose 65+) 04/27/2023   Influenza Inj Mdck Quad Pf 04/12/2017   Influenza, High Dose Seasonal PF 03/14/2018   Influenza, Quadrivalent, Recombinant, Inj, Pf 03/05/2019, 05/22/2021   PFIZER(Purple Top)SARS-COV-2 Vaccination 07/25/2019, 08/16/2019, 12/12/2021   Pneumococcal-Unspecified 03/02/2011   Tdap 03/12/2018   Zoster Recombinant(Shingrix) 10/08/2017, 12/09/2017   Pertinent  Health Maintenance Due  Topic Date Due   DEXA SCAN  Never done   INFLUENZA VACCINE  Completed      07/12/2022   11:08 AM 10/30/2022   12:44 PM 01/22/2023    1:15 PM 04/04/2023   11:00 AM 07/10/2023    9:02 AM  Fall Risk  Falls in the past year? 0 0 0 0 0  Was there an injury with Fall? 0 0 0 0 0  Fall Risk Category  Calculator 0 0 0 0 0  Fall Risk Category (Retired) Low      (RETIRED) Patient Fall Risk Level Low fall risk      Patient at Risk for Falls Due to History of fall(s) History of fall(s);Impaired balance/gait;Impaired mobility History of fall(s);Impaired balance/gait History of fall(s);Impaired balance/gait;Impaired mobility History of fall(s);Impaired balance/gait;Impaired mobility  Fall risk Follow up Falls evaluation completed Falls evaluation completed;Education provided;Falls prevention discussed Falls evaluation completed;Education provided;Falls prevention discussed Falls evaluation completed;Education provided;Falls prevention discussed Falls evaluation completed;Education provided;Falls prevention discussed   Functional Status Survey:    Vitals:   08/04/23 1356  BP: 114/67  Pulse: 73  Resp: (!) 22  Temp: 97.7 F (36.5 C)  SpO2: 94%  Weight: 177 lb (80.3 kg)  Height: 5\' 1"  (1.549 m)   Body mass index is 33.44 kg/m. Physical Exam Vitals reviewed.  Constitutional:      General: She is not in acute distress. HENT:     Head: Normocephalic.  Eyes:     General:        Right eye: No discharge.        Left eye: No discharge.  Cardiovascular:     Rate and Rhythm: Normal rate and regular rhythm.     Pulses:          Dorsalis pedis pulses are 1+ on the right side.       Posterior tibial pulses are 1+ on the right side.     Heart sounds: Normal heart sounds.  Pulmonary:     Effort: Pulmonary effort is normal.     Breath sounds: Normal breath sounds.  Abdominal:     General: Bowel sounds are normal.     Palpations: Abdomen is soft.  Musculoskeletal:     Cervical back: Neck supple.     Right lower leg: Swelling present. No deformity. Edema present.     Left lower leg: No edema.  Comments: Non pitting edema  Skin:    Capillary Refill: Capillary refill takes less than 2 seconds.     Findings: Erythema present.     Comments: Scant redness to RLE over anterior shin, RLE warm  to touch  Neurological:     General: No focal deficit present.     Mental Status: She is alert and oriented to person, place, and time.     Motor: Weakness present.     Gait: Gait abnormal.  Psychiatric:        Mood and Affect: Mood normal.     Labs reviewed: Recent Labs    04/25/23 0322 04/27/23 0629 04/29/23 0541 05/09/23 0000  NA 142 140 141 143  K 3.6 3.8 3.7 4.2  CL 112* 106 106 107  CO2 24 27 26  26*  GLUCOSE 108* 120* 108*  --   BUN 6* 10 8 11   CREATININE 0.44 0.48 0.52 0.6  CALCIUM 8.2* 8.6* 8.7* 9.1   Recent Labs    03/29/23 0843 04/03/23 0000 04/19/23 1144 04/20/23 0739  AST 16 14 16 16   ALT 10 9 9 9   ALKPHOS 40 48 31* 27*  BILITOT 0.6  --  0.6 1.1  PROT 6.7  --  5.8* 5.0*  ALBUMIN 3.8 4.2 3.4* 3.0*   Recent Labs    04/03/23 0000 04/19/23 1144 04/19/23 1227 04/26/23 1025 04/27/23 0629 04/29/23 0630  WBC 5.7 7.9   < > 5.7 5.5 5.2  NEUTROABS 33,335.00 5.3  --  3.7  --   --   HGB 10.8* 5.9*   < > 8.8* 8.4* 8.0*  HCT 34* 19.2*   < > 28.8* 27.2* 25.6*  MCV  --  103.8*   < > 99.3 98.6 97.3  PLT 195 154   < > 196 197 189   < > = values in this interval not displayed.   Lab Results  Component Value Date   TSH 2.47 10/08/2022   No results found for: "HGBA1C" Lab Results  Component Value Date   CHOL 147 02/18/2022   HDL 51 02/18/2022   LDLCALC 72 02/18/2022   TRIG 162 (A) 02/18/2022    Significant Diagnostic Results in last 30 days:  No results found.  Assessment/Plan 1. Pain and swelling of right lower leg (Primary) - began 3 days ago - no recent injury or fall - RLE with non pitting edema, warmth and swelling - concerns for DVT versus cellulitis - will order RLE ultrasound/doppler to r/o DVT - stat cbc/diff> WBC 3.78 - start tylenol 1000 mg po BID x 3 days  2. Collagenous colitis -  10/25 colonoscopy showed moderate proximal colonic ischemic mucosa, biopsies taken> confirmed collagenous colitis  - 12/04> [facility] f/u did not  recommend budesonide at this time  3. Sessile serrated polyp of colon - per 12/04 [facility] f/u - endoscopic poly removal with [facility] recommended> still undecided  4. Blood loss anemia - see above - hgb 11.6 today - cont Protonix and ferrous gluconate    Family/ staff Communication: plan discussed with patient and nurse  Labs/tests ordered:   stat cbc/diff and ultrasound/doppler of RLE

## 2023-08-05 DIAGNOSIS — R41841 Cognitive communication deficit: Secondary | ICD-10-CM | POA: Diagnosis not present

## 2023-08-05 DIAGNOSIS — R1312 Dysphagia, oropharyngeal phase: Secondary | ICD-10-CM | POA: Diagnosis not present

## 2023-08-06 DIAGNOSIS — R601 Generalized edema: Secondary | ICD-10-CM | POA: Diagnosis not present

## 2023-08-07 DIAGNOSIS — R41841 Cognitive communication deficit: Secondary | ICD-10-CM | POA: Diagnosis not present

## 2023-08-07 DIAGNOSIS — R1312 Dysphagia, oropharyngeal phase: Secondary | ICD-10-CM | POA: Diagnosis not present

## 2023-08-08 ENCOUNTER — Non-Acute Institutional Stay (SKILLED_NURSING_FACILITY): Payer: Self-pay | Admitting: Orthopedic Surgery

## 2023-08-08 ENCOUNTER — Encounter: Payer: Self-pay | Admitting: Orthopedic Surgery

## 2023-08-08 DIAGNOSIS — M79661 Pain in right lower leg: Secondary | ICD-10-CM | POA: Diagnosis not present

## 2023-08-08 DIAGNOSIS — M19071 Primary osteoarthritis, right ankle and foot: Secondary | ICD-10-CM | POA: Diagnosis not present

## 2023-08-08 DIAGNOSIS — M7989 Other specified soft tissue disorders: Secondary | ICD-10-CM | POA: Diagnosis not present

## 2023-08-08 DIAGNOSIS — M85871 Other specified disorders of bone density and structure, right ankle and foot: Secondary | ICD-10-CM | POA: Diagnosis not present

## 2023-08-08 DIAGNOSIS — R41841 Cognitive communication deficit: Secondary | ICD-10-CM | POA: Diagnosis not present

## 2023-08-08 DIAGNOSIS — R1312 Dysphagia, oropharyngeal phase: Secondary | ICD-10-CM | POA: Diagnosis not present

## 2023-08-08 NOTE — Progress Notes (Signed)
 Location:  [facility] Room Number: 20/A Place of Service:  ALF [phone]) Provider:  Greig FORBES Cluster, NP   Cluster Greig FORBES, NP  Patient Care Team: Cluster Greig FORBES, NP as PCP - General (Adult Health Nurse Practitioner) Charlanne Fredia CROME, MD as Consulting Physician (Internal Medicine)  Extended Emergency Contact Information Primary Emergency Contact: Ulibarri,[name] Address: [address]          [place], [place] [address] [place] Home Phone: [phone] Mobile Phone: [phone] Relation: Spouse Secondary Emergency Contact: Seng MD,[name] Ozell Crane Phone: [phone] Relation: Son Interpreter needed? No  Code Status:  DNR Goals of care: Advanced Directive information    08/04/2023    2:43 PM  Advanced Directives  Does Patient Have a Medical Advance Directive? Yes  Type of Advance Directive Out of facility DNR (pink MOST or yellow form)  Does patient want to make changes to medical advance directive? No - Patient declined     Chief Complaint  Patient presents with   Acute Visit    Right leg swelling and pain    HPI:  Pt is a 83 y.o. female seen today for acute visit due to ongoing right foot pain.   She currently resides on the assisted living unit at [facility]. PMH: HTN, COPD, pulmonary nodule, Diverticulosis with GI bleed, GERD, OA, osteoporosis,macular degeneration, iron  def anemia, unstable gait, urinary incontinence, anxiety, and depression.   She started experiencing right lower leg pain about three days ago. The pain is mild and present at rest. No recent fall or injury. Today, her right leg is more swollen than her left and is warm to touch. She notes redness in the right lower leg but denies any skin abrasions. Apparently left leg was more bothersome a few days ago and xray was ordered. Left ankle xray was negative for acute fracture or dislocation. WBAT today. She is not currently taking any medication for the leg pain. Denies chest  pain and shortness of breath. Afebrile. Vitals stable.    She has a history of osteoarthritis affecting multiple joints, with the knees and neck being the most bothersome. She is not on any anticoagulants due to multiple hospitalizations in the past two years for diverticular bleeding.  Recent U/S doppler of right lower leg negative for DVT. She continues to have right ankle pain with movement. No recent injury or fall. RLE with non pitting edema. Recent WBC within normal range. Afebrile. Vitals stable.    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, left side   Sleeping excessive    Tinnitus of both ears    Past Surgical History:  Procedure Laterality Date   BALLOON DILATION N/A 05/08/2021   Procedure: BALLOON DILATION;  Surgeon: Saintclair Jasper, MD;  Location: [facility] ENDOSCOPY;  Service: Gastroenterology;  Laterality: N/A;   BASAL CELL CARCINOMA EXCISION  2001   off my nose, left side   BIOPSY  05/08/2021   Procedure: BIOPSY;  Surgeon: Saintclair Jasper, MD;  Location: [facility] ENDOSCOPY;  Service: Gastroenterology;;   BIOPSY  04/25/2023   Procedure: BIOPSY;  Surgeon: Dianna Specking, MD;  Location: [facility] ENDOSCOPY;  Service: Gastroenterology;;   BLEPHAROPLASTY Bilateral    CATARACT EXTRACTION W/ INTRAOCULAR LENS  IMPLANT, BILATERAL Bilateral 1990's   COLONOSCOPY WITH PROPOFOL  N/A 05/04/2022   Procedure: COLONOSCOPY WITH PROPOFOL ;  Surgeon: Saintclair Jasper, MD;  Location: [facility]  ENDOSCOPY;  Service: Gastroenterology;  Laterality: N/A;   COLONOSCOPY WITH PROPOFOL  N/A 04/25/2023   Procedure: COLONOSCOPY WITH PROPOFOL ;  Surgeon: Dianna Specking, MD;  Location: [facility] ENDOSCOPY;  Service: Gastroenterology;  Laterality: N/A;   ESOPHAGOGASTRODUODENOSCOPY (EGD) WITH PROPOFOL  N/A 05/08/2021   Procedure: ESOPHAGOGASTRODUODENOSCOPY (EGD) WITH PROPOFOL ;  Surgeon: Saintclair Jasper, MD;  Location: [facility] ENDOSCOPY;  Service: Gastroenterology;  Laterality: N/A;   ESOPHAGOGASTRODUODENOSCOPY (EGD) WITH PROPOFOL  N/A 01/11/2022   Procedure: ESOPHAGOGASTRODUODENOSCOPY (EGD) WITH PROPOFOL ;  Surgeon: Rosalie Kitchens, MD;  Location: [facility] ENDOSCOPY;  Service: Gastroenterology;  Laterality: N/A;   ESOPHAGOGASTRODUODENOSCOPY (EGD) WITH PROPOFOL  N/A 01/06/2023   Procedure: ESOPHAGOGASTRODUODENOSCOPY (EGD) WITH PROPOFOL ;  Surgeon: Rosalie Kitchens, MD;  Location: [facility] ENDOSCOPY;  Service: Gastroenterology;  Laterality: N/A;   ESOPHAGOGASTRODUODENOSCOPY (EGD) WITH PROPOFOL  N/A 04/21/2023   Procedure: ESOPHAGOGASTRODUODENOSCOPY (EGD) WITH PROPOFOL ;  Surgeon: Dianna Specking, MD;  Location: [facility] ENDOSCOPY;  Service: Gastroenterology;  Laterality: N/A;   EYE SURGERY Bilateral    to improve vision after cataract OR   GIVENS CAPSULE STUDY N/A 04/21/2023   Procedure: GIVENS CAPSULE STUDY;  Surgeon: Dianna Specking, MD;  Location: [facility] ENDOSCOPY;  Service: Gastroenterology;  Laterality: N/A;   IR ANGIOGRAM FOLLOW UP STUDY  12/19/2020   IR ANGIOGRAM SELECTIVE EACH ADDITIONAL VESSEL  12/19/2020   IR ANGIOGRAM SELECTIVE EACH ADDITIONAL VESSEL  12/19/2020   IR ANGIOGRAM VISCERAL SELECTIVE  12/19/2020   IR EMBO ART  VEN HEMORR LYMPH EXTRAV  INC GUIDE ROADMAPPING  12/19/2020   IR US  GUIDE VASC ACCESS RIGHT  12/19/2020   JOINT REPLACEMENT     POLYPECTOMY  05/04/2022   Procedure: POLYPECTOMY;  Surgeon: Saintclair Jasper, MD;  Location: [facility] ENDOSCOPY;  Service: Gastroenterology;;   STAPEDES SURGERY Left    scraped stapedes because it was sticking  when it wasn't suppose to   TONSILLECTOMY AND ADENOIDECTOMY  1946   TOTAL KNEE ARTHROPLASTY Left ~ 2008   TOTAL KNEE ARTHROPLASTY  12/20/2011   Procedure: TOTAL KNEE ARTHROPLASTY;  Surgeon: Elspeth JONELLE Her, MD;  Location: [facility] OR;  Service: Orthopedics;  Laterality: Right;  Right Total Knee Arthroplasty   TUBAL LIGATION  1980's    Allergies  Allergen Reactions   Morphine And Codeine Itching   Aspirin Other (See Comments)    Unknown reaction   Duloxetine  Hcl Other (See Comments)    Unknown reaction   Tymlos [Abaloparatide] Other (See Comments)    Unknown reaction   Ventolin  [Albuterol ] Other (See Comments)    rapid heart beat   Cefadroxil Hives    Patient can take amoxicillin  and cipro     Outpatient Encounter Medications as of 08/08/2023  Medication Sig   acetaminophen  (TYLENOL ) 500 MG tablet Take 1,000 mg by mouth every 8 (eight) hours as needed for moderate pain (pain score 4-6).   acetaminophen  (TYLENOL ) 500 MG tablet Take 1,000 mg by mouth in the morning and at bedtime.   acetaminophen  (TYLENOL ) 650 MG CR tablet Take 1,300 mg by mouth at bedtime.   albuterol  (VENTOLIN  HFA) 108 (  90 Base) MCG/ACT inhaler Inhale 2 puffs into the lungs every 4 (four) hours as needed for wheezing or shortness of breath.   alendronate (FOSAMAX) 70 MG tablet Take 70 mg by mouth every Sunday. Take with a full glass of water on an empty stomach.   Ascorbic Acid  (VITAMIN C ) 1000 MG tablet Take 1,000 mg by mouth every morning.   cetirizine  (ZYRTEC ) 5 MG chewable tablet Chew 5 mg by mouth daily as needed for allergies.   Cholecalciferol  (VITAMIN D3) 50 MCG (2000 UT) TABS Take 2,000 Units by mouth every morning.   docusate sodium  (COLACE) 100 MG capsule Take 200 mg by mouth at bedtime.   Dorzolamide  HCl-Timolol  Mal PF 2-0.5 % SOLN Place 1 drop into both eyes in the morning and at bedtime.   ferrous gluconate  (FERGON) 240 (27 FE) MG tablet Take 240 mg by mouth every morning. Take without food   fluticasone   (FLONASE ) 50 MCG/ACT nasal spray Place 1 spray into both nostrils daily as needed for allergies or rhinitis.   guaiFENesin  (ROBITUSSIN) 100 MG/5ML liquid Take 10 mLs by mouth every 4 (four) hours as needed for cough or to loosen phlegm.   latanoprost  (XALATAN ) 0.005 % ophthalmic solution Place 1 drop into both eyes at bedtime.   Multiple Vitamins-Minerals (PRESERVISION AREDS 2) CAPS Take 1 capsule by mouth 2 (two) times daily.   Netarsudil  Dimesylate (RHOPRESSA ) 0.02 % SOLN Place 1 drop into both eyes at bedtime.   oxybutynin  (DITROPAN -XL) 5 MG 24 hr tablet Take 5 mg by mouth at bedtime.   pantoprazole  (PROTONIX ) 40 MG tablet Take 40 mg by mouth 2 (two) times daily.   polyethylene glycol (MIRALAX  / GLYCOLAX ) 17 g packet Take 8.5 g by mouth daily as needed (constipation).   Probiotic Product (ALIGN) 4 MG CAPS Take 4 mg by mouth at bedtime.   rOPINIRole  (REQUIP ) 0.5 MG tablet Take 1 tablet (0.5 mg total) by mouth daily as needed.   sertraline  (ZOLOFT ) 50 MG tablet Take 1.5 tablets (75 mg total) by mouth daily.   simvastatin  (ZOCOR ) 20 MG tablet Take 20 mg by mouth every evening.   Sodium Fluoride  (PREVIDENT 5000 BOOSTER PLUS) 1.1 % PSTE Place 1 Application onto teeth See admin instructions. Brush on teeth with a toothbrush after evening mouth care. Spit out excess and do not rinse.   umeclidinium-vilanterol (ANORO ELLIPTA ) 62.5-25 MCG/ACT AEPB Inhale 1 puff into the lungs every morning.   zinc oxide 20 % ointment Apply 1 Application topically See admin instructions. Apply topically to buttocks after every incontinent episode and as needed for redness   No facility-administered encounter medications on file as of 08/08/2023.    Review of Systems  Constitutional:  Negative for activity change and appetite change.  Respiratory:  Negative for shortness of breath.   Cardiovascular:  Positive for leg swelling. Negative for chest pain.  Musculoskeletal:  Positive for arthralgias and gait problem.   Psychiatric/Behavioral:  Positive for dysphoric mood. The patient is not nervous/anxious.     Immunization History  Administered Date(s) Administered   Fluad Quad(high Dose 65+) 04/24/2022   Fluad Trivalent(High Dose 65+) 04/27/2023   Influenza Inj Mdck Quad Pf 04/12/2017   Influenza, High Dose Seasonal PF 03/14/2018   Influenza, Quadrivalent, Recombinant, Inj, Pf 03/05/2019, 05/22/2021   PFIZER(Purple Top)SARS-COV-2 Vaccination 07/25/2019, 08/16/2019, 12/12/2021   Pneumococcal-Unspecified 03/02/2011   Tdap 03/12/2018   Zoster Recombinant(Shingrix) 10/08/2017, 12/09/2017   Pertinent  Health Maintenance Due  Topic Date Due   DEXA SCAN  Never done  INFLUENZA VACCINE  Completed      07/12/2022   11:08 AM 10/30/2022   12:44 PM 01/22/2023    1:15 PM 04/04/2023   11:00 AM 07/10/2023    9:02 AM  Fall Risk  Falls in the past year? 0 0 0 0 0  Was there an injury with Fall? 0 0 0 0 0  Fall Risk Category Calculator 0 0 0 0 0  Fall Risk Category (Retired) Low      (RETIRED) Patient Fall Risk Level Low fall risk      Patient at Risk for Falls Due to History of fall(s) History of fall(s);Impaired balance/gait;Impaired mobility History of fall(s);Impaired balance/gait History of fall(s);Impaired balance/gait;Impaired mobility History of fall(s);Impaired balance/gait;Impaired mobility  Fall risk Follow up Falls evaluation completed Falls evaluation completed;Education provided;Falls prevention discussed Falls evaluation completed;Education provided;Falls prevention discussed Falls evaluation completed;Education provided;Falls prevention discussed Falls evaluation completed;Education provided;Falls prevention discussed   Functional Status Survey:    Vitals:   08/08/23 1537  BP: 126/70  Pulse: 75  Resp: 20  Temp: 98.2 F (36.8 C)  SpO2: 94%  Weight: 176 lb (79.8 kg)  Height: 5' 1 (1.549 m)   Body mass index is 33.25 kg/m. Physical Exam Vitals reviewed.  Constitutional:      General:  She is not in acute distress. HENT:     Head: Normocephalic.  Eyes:     General:        Right eye: No discharge.        Left eye: No discharge.  Cardiovascular:     Rate and Rhythm: Normal rate and regular rhythm.     Pulses: Normal pulses.     Heart sounds: Normal heart sounds.  Pulmonary:     Effort: Pulmonary effort is normal.     Breath sounds: Normal breath sounds.  Abdominal:     Palpations: Abdomen is soft.  Musculoskeletal:     Right lower leg: Edema present.     Left lower leg: No edema.     Comments: Non pitting edema to RLE. FROM to right lower extremity  Skin:    General: Skin is warm.     Capillary Refill: Capillary refill takes less than 2 seconds.  Neurological:     General: No focal deficit present.     Mental Status: She is alert and oriented to person, place, and time.  Psychiatric:        Mood and Affect: Mood normal.     Labs reviewed: Recent Labs    04/25/23 0322 04/27/23 0629 04/29/23 0541 05/09/23 0000  NA 142 140 141 143  K 3.6 3.8 3.7 4.2  CL 112* 106 106 107  CO2 24 27 26  26*  GLUCOSE 108* 120* 108*  --   BUN 6* 10 8 11   CREATININE 0.44 0.48 0.52 0.6  CALCIUM  8.2* 8.6* 8.7* 9.1   Recent Labs    03/29/23 0843 04/03/23 0000 04/19/23 1144 04/20/23 0739  AST 16 14 16 16   ALT 10 9 9 9   ALKPHOS 40 48 31* 27*  BILITOT 0.6  --  0.6 1.1  PROT 6.7  --  5.8* 5.0*  ALBUMIN 3.8 4.2 3.4* 3.0*   Recent Labs    04/19/23 1144 04/19/23 1227 04/26/23 1025 04/27/23 0629 04/29/23 0630 07/14/23 0000  WBC 7.9   < > 5.7 5.5 5.2 4.6  NEUTROABS 5.3  --  3.7  --   --  2,640.00  HGB 5.9*   < > 8.8* 8.4* 8.0* 11.7*  HCT 19.2*   < > 28.8* 27.2* 25.6* 37  MCV 103.8*   < > 99.3 98.6 97.3  --   PLT 154   < > 196 197 189 156   < > = values in this interval not displayed.   Lab Results  Component Value Date   TSH 2.47 10/08/2022   No results found for: HGBA1C Lab Results  Component Value Date   CHOL 125 07/14/2023   HDL 51 07/14/2023    LDLCALC 53 07/14/2023   TRIG 130 07/14/2023    Significant Diagnostic Results in last 30 days:  No results found.  Assessment/Plan - ongoing - non pitting edema, pain with movement - xray right ankle> negative for acute fracture and dislocation> mild medical and lateral soft tissue swelling - furosemide  20 mg po daily x 2 days - start compression stockings 02/10 - cont tylenol  for pain    Family/ staff Communication: plan discussed with patient, son and nurse  Labs/tests ordered:  xray right ankle

## 2023-08-12 DIAGNOSIS — R1312 Dysphagia, oropharyngeal phase: Secondary | ICD-10-CM | POA: Diagnosis not present

## 2023-08-12 DIAGNOSIS — R41841 Cognitive communication deficit: Secondary | ICD-10-CM | POA: Diagnosis not present

## 2023-08-14 ENCOUNTER — Non-Acute Institutional Stay: Payer: Self-pay | Admitting: Internal Medicine

## 2023-08-14 DIAGNOSIS — D500 Iron deficiency anemia secondary to blood loss (chronic): Secondary | ICD-10-CM | POA: Diagnosis not present

## 2023-08-14 DIAGNOSIS — M7989 Other specified soft tissue disorders: Secondary | ICD-10-CM

## 2023-08-14 DIAGNOSIS — R600 Localized edema: Secondary | ICD-10-CM | POA: Diagnosis not present

## 2023-08-14 DIAGNOSIS — M79661 Pain in right lower leg: Secondary | ICD-10-CM

## 2023-08-14 NOTE — Progress Notes (Signed)
 Location: [facility] Service:  ALF [phone])  Provider:   Code Status:  Goals of Care:     08/04/2023    2:43 PM  Advanced Directives  Does Patient Have a Medical Advance Directive? Yes  Type of Advance Directive Out of facility DNR (pink MOST or yellow form)  Does patient want to make changes to medical advance directive? No - Patient declined     Chief Complaint  Patient presents with   Acute Visit    HPI: Patient is a 83 y.o. female seen today for an acute visit for Edema and Right Leg Pain  Lives in [place] in [facility]  Patient has a history of recurrent GI bleed with multiple workup, COPD on nocturnal oxygen she is former smoker, history of pulmonary nodules, hypertension, osteoporosis, urge incontinence   Patient was seen at request of her husband for Right Lower leg swelling and Pain Patient has been seen twice for this already Her Ankle Xray is negative for any New Fractures Just showed old healed malleolus fractures. Her  Dopplers were negative for any DVT Patient states that she continues to have swelling.  She also has swelling in the left leg but the right is more than left.  She also describes discomfort in her calf area .  She is able to walk and put weight on that leg.  He is also having some pain in her right shoulder and she is putting more weight on it while walking with the walker  She has gained weight 170lbs to 176 lbs recently  Past Medical History:  Diagnosis Date   Anxiety    Arthritis    "bilateral knees, shoulders, elbows; neck, pretty widespread" (09/05/2017)   BPPV (benign paroxysmal positional vertigo)    Depression    GERD (gastroesophageal reflux disease)    Glaucoma, both eyes    Headache    "probably 2/month" (09/05/2017)   History of blood transfusion ~ 2008   "related to LGIB"   Hyperlipemia    Lower GI bleeding ~ 2008; 09/05/2017   "had to have blood transfusion"   Macular degeneration, bilateral    Osteopenia    Seasonal  allergies    Skin cancer, basal cell 2001   "off my nose, left side"   Sleeping excessive    Tinnitus of both ears     Past Surgical History:  Procedure Laterality Date   BALLOON DILATION N/A 05/08/2021   Procedure: BALLOON DILATION;  Surgeon: Kerin Salen, MD;  Location: [facility] ENDOSCOPY;  Service: Gastroenterology;  Laterality: N/A;   BASAL CELL CARCINOMA EXCISION  2001   "off my nose, left side"   BIOPSY  05/08/2021   Procedure: BIOPSY;  Surgeon: Kerin Salen, MD;  Location: [facility] ENDOSCOPY;  Service: Gastroenterology;;   BIOPSY  04/25/2023   Procedure: BIOPSY;  Surgeon: Charlott Rakes, MD;  Location: [facility] ENDOSCOPY;  Service: Gastroenterology;;   BLEPHAROPLASTY Bilateral    CATARACT EXTRACTION W/ INTRAOCULAR LENS  IMPLANT, BILATERAL Bilateral 1990's   COLONOSCOPY WITH PROPOFOL N/A 05/04/2022   Procedure: COLONOSCOPY WITH PROPOFOL;  Surgeon: Kerin Salen, MD;  Location: [facility] ENDOSCOPY;  Service: Gastroenterology;  Laterality: N/A;   COLONOSCOPY WITH PROPOFOL N/A 04/25/2023   Procedure: COLONOSCOPY WITH PROPOFOL;  Surgeon: Charlott Rakes, MD;  Location: [facility] ENDOSCOPY;  Service: Gastroenterology;  Laterality: N/A;   ESOPHAGOGASTRODUODENOSCOPY (EGD) WITH PROPOFOL N/A 05/08/2021   Procedure: ESOPHAGOGASTRODUODENOSCOPY (EGD) WITH PROPOFOL;  Surgeon: Kerin Salen, MD;  Location: [facility] ENDOSCOPY;  Service: Gastroenterology;  Laterality:  N/A;   ESOPHAGOGASTRODUODENOSCOPY (EGD) WITH PROPOFOL N/A 01/11/2022   Procedure: ESOPHAGOGASTRODUODENOSCOPY (EGD) WITH PROPOFOL;  Surgeon: Vida Rigger, MD;  Location: [facility] ENDOSCOPY;  Service: Gastroenterology;  Laterality: N/A;   ESOPHAGOGASTRODUODENOSCOPY (EGD) WITH PROPOFOL N/A 01/06/2023   Procedure: ESOPHAGOGASTRODUODENOSCOPY (EGD) WITH PROPOFOL;  Surgeon: Vida Rigger, MD;  Location: [facility] ENDOSCOPY;  Service: Gastroenterology;  Laterality: N/A;   ESOPHAGOGASTRODUODENOSCOPY (EGD) WITH PROPOFOL N/A 04/21/2023   Procedure: ESOPHAGOGASTRODUODENOSCOPY (EGD) WITH PROPOFOL;   Surgeon: Charlott Rakes, MD;  Location: [facility] ENDOSCOPY;  Service: Gastroenterology;  Laterality: N/A;   EYE SURGERY Bilateral    "to improve vision after cataract OR"   GIVENS CAPSULE STUDY N/A 04/21/2023   Procedure: GIVENS CAPSULE STUDY;  Surgeon: Charlott Rakes, MD;  Location: [facility] ENDOSCOPY;  Service: Gastroenterology;  Laterality: N/A;   IR ANGIOGRAM FOLLOW UP STUDY  12/19/2020   IR ANGIOGRAM SELECTIVE EACH ADDITIONAL VESSEL  12/19/2020   IR ANGIOGRAM SELECTIVE EACH ADDITIONAL VESSEL  12/19/2020   IR ANGIOGRAM VISCERAL SELECTIVE  12/19/2020   IR EMBO ART  VEN HEMORR LYMPH EXTRAV  INC GUIDE ROADMAPPING  12/19/2020   IR US GUIDE VASC ACCESS RIGHT  12/19/2020   JOINT REPLACEMENT     POLYPECTOMY  05/04/2022   Procedure: POLYPECTOMY;  Surgeon: Kerin Salen, MD;  Location: [facility] ENDOSCOPY;  Service: Gastroenterology;;   STAPEDES SURGERY Left    "scraped stapedes because it was sticking when it wasn't suppose to"   TONSILLECTOMY AND ADENOIDECTOMY  1946   TOTAL KNEE ARTHROPLASTY Left ~ 2008   TOTAL KNEE ARTHROPLASTY  12/20/2011   Procedure: TOTAL KNEE ARTHROPLASTY;  Surgeon: Verlee Rossetti, MD;  Location: [facility] OR;  Service: Orthopedics;  Laterality: Right;  Right Total Knee Arthroplasty   TUBAL LIGATION  1980's    Allergies  Allergen Reactions   Morphine And Codeine Itching   Aspirin Other (See Comments)    Unknown reaction   Duloxetine Hcl Other (See Comments)    Unknown reaction   Tymlos [Abaloparatide] Other (See Comments)    Unknown reaction   Ventolin [Albuterol] Other (See Comments)    rapid heart beat   Cefadroxil Hives    Patient can take amoxicillin and cipro    Outpatient Encounter Medications as of 08/14/2023  Medication Sig   acetaminophen (TYLENOL) 500 MG tablet Take 1,000 mg by mouth every 8 (eight) hours as needed for moderate pain (pain score 4-6).   acetaminophen (TYLENOL) 500 MG tablet Take 1,000 mg by mouth in the morning and at bedtime.   acetaminophen (TYLENOL) 650 MG  CR tablet Take 1,300 mg by mouth at bedtime.   albuterol (VENTOLIN HFA) 108 (90 Base) MCG/ACT inhaler Inhale 2 puffs into the lungs every 4 (four) hours as needed for wheezing or shortness of breath.   alendronate (FOSAMAX) 70 MG tablet Take 70 mg by mouth every Sunday. Take with a full glass of water on an empty stomach.   Ascorbic Acid (VITAMIN C) 1000 MG tablet Take 1,000 mg by mouth every morning.   cetirizine (ZYRTEC) 5 MG chewable tablet Chew 5 mg by mouth daily as needed for allergies.   Cholecalciferol (VITAMIN D3) 50 MCG (2000 UT) TABS Take 2,000 Units by mouth every morning.   docusate sodium (COLACE) 100 MG capsule Take 200 mg by mouth at bedtime.   Dorzolamide HCl-Timolol Mal PF 2-0.5 % SOLN Place 1 drop into both eyes in the morning and at bedtime.   ferrous gluconate (FERGON) 240 (27 FE) MG tablet Take 240 mg by mouth every morning. Take  without food   fluticasone (FLONASE) 50 MCG/ACT nasal spray Place 1 spray into both nostrils daily as needed for allergies or rhinitis.   guaiFENesin (ROBITUSSIN) 100 MG/5ML liquid Take 10 mLs by mouth every 4 (four) hours as needed for cough or to loosen phlegm.   latanoprost (XALATAN) 0.005 % ophthalmic solution Place 1 drop into both eyes at bedtime.   Multiple Vitamins-Minerals (PRESERVISION AREDS 2) CAPS Take 1 capsule by mouth 2 (two) times daily.   Netarsudil Dimesylate (RHOPRESSA) 0.02 % SOLN Place 1 drop into both eyes at bedtime.   oxybutynin (DITROPAN-XL) 5 MG 24 hr tablet Take 5 mg by mouth at bedtime.   pantoprazole (PROTONIX) 40 MG tablet Take 40 mg by mouth 2 (two) times daily.   polyethylene glycol (MIRALAX / GLYCOLAX) 17 g packet Take 8.5 g by mouth daily as needed (constipation).   Probiotic Product (ALIGN) 4 MG CAPS Take 4 mg by mouth at bedtime.   rOPINIRole (REQUIP) 0.5 MG tablet Take 1 tablet (0.5 mg total) by mouth daily as needed.   sertraline (ZOLOFT) 50 MG tablet Take 1.5 tablets (75 mg total) by mouth daily.   simvastatin  (ZOCOR) 20 MG tablet Take 20 mg by mouth every evening.   Sodium Fluoride (PREVIDENT 5000 BOOSTER PLUS) 1.1 % PSTE Place 1 Application onto teeth See admin instructions. Brush on teeth with a toothbrush after evening mouth care. Spit out excess and do not rinse.   umeclidinium-vilanterol (ANORO ELLIPTA) 62.5-25 MCG/ACT AEPB Inhale 1 puff into the lungs every morning.   zinc oxide 20 % ointment Apply 1 Application topically See admin instructions. Apply topically to buttocks after every incontinent episode and as needed for redness   No facility-administered encounter medications on file as of 08/14/2023.    Review of Systems:  Review of Systems  Constitutional:  Positive for activity change. Negative for appetite change.  HENT: Negative.    Respiratory:  Negative for cough and shortness of breath.   Cardiovascular:  Negative for leg swelling.  Gastrointestinal:  Negative for constipation.  Genitourinary: Negative.   Musculoskeletal:  Negative for arthralgias, gait problem and myalgias.  Skin: Negative.   Neurological:  Negative for dizziness and weakness.  Psychiatric/Behavioral:  Negative for confusion, dysphoric mood and sleep disturbance.     Health Maintenance  Topic Date Due   DEXA SCAN  Never done   COVID-19 Vaccine (4 - 2024-25 season) 03/02/2023   Pneumonia Vaccine 51+ Years old (1 of 2 - PCV) 10/03/2023 (Originally 11/13/1946)   Medicare Annual Wellness (AWV)  04/03/2024   DTaP/Tdap/Td (2 - Td or Tdap) 03/12/2028   INFLUENZA VACCINE  Completed   Zoster Vaccines- Shingrix  Completed   HPV VACCINES  Aged Out    Physical Exam: Vitals:   08/14/23 1736  BP: 130/66  Pulse: 70  Resp: 20  Temp: 97.9 F (36.6 C)  Weight: 176 lb (79.8 kg)   Body mass index is 33.25 kg/m. Physical Exam Vitals reviewed.  Constitutional:      Appearance: Normal appearance.  HENT:     Head: Normocephalic.     Nose: Nose normal.     Mouth/Throat:     Mouth: Mucous membranes are moist.      Pharynx: Oropharynx is clear.  Eyes:     Pupils: Pupils are equal, round, and reactive to light.  Cardiovascular:     Rate and Rhythm: Normal rate and regular rhythm.     Pulses: Normal pulses.     Heart sounds: Normal  heart sounds. No murmur heard. Pulmonary:     Effort: Pulmonary effort is normal.     Breath sounds: Normal breath sounds.  Abdominal:     General: Abdomen is flat. Bowel sounds are normal.     Palpations: Abdomen is soft.  Musculoskeletal:        General: Swelling present.     Cervical back: Neck supple.     Comments: Has bilateral Swelling Right more then Left No Redness No Warm No Point tenderness  Right should No Point tenderness  No Restriction Moving well   Skin:    General: Skin is warm.  Neurological:     General: No focal deficit present.     Mental Status: She is alert and oriented to person, place, and time.  Psychiatric:        Mood and Affect: Mood normal.        Thought Content: Thought content normal.     Labs reviewed: Basic Metabolic Panel: Recent Labs    10/08/22 0000 12/23/22 0000 04/25/23 0322 04/27/23 0629 04/29/23 0541 05/09/23 0000  NA 140   < > 142 140 141 143  K 4.5   < > 3.6 3.8 3.7 4.2  CL 106   < > 112* 106 106 107  CO2 29*   < > 24 27 26  26*  GLUCOSE  --    < > 108* 120* 108*  --   BUN 11   < > 6* 10 8 11   CREATININE 0.5   < > 0.44 0.48 0.52 0.6  CALCIUM 9.2   < > 8.2* 8.6* 8.7* 9.1  TSH 2.47  --   --   --   --   --    < > = values in this interval not displayed.   Liver Function Tests: Recent Labs    03/29/23 0843 04/03/23 0000 04/19/23 1144 04/20/23 0739  AST 16 14 16 16   ALT 10 9 9 9   ALKPHOS 40 48 31* 27*  BILITOT 0.6  --  0.6 1.1  PROT 6.7  --  5.8* 5.0*  ALBUMIN 3.8 4.2 3.4* 3.0*   No results for input(s): "LIPASE", "AMYLASE" in the last 8760 hours. No results for input(s): "AMMONIA" in the last 8760 hours. CBC: Recent Labs    04/19/23 1144 04/19/23 1227 04/26/23 1025 04/27/23 0629  04/29/23 0630 07/14/23 0000  WBC 7.9   < > 5.7 5.5 5.2 4.6  NEUTROABS 5.3  --  3.7  --   --  2,640.00  HGB 5.9*   < > 8.8* 8.4* 8.0* 11.7*  HCT 19.2*   < > 28.8* 27.2* 25.6* 37  MCV 103.8*   < > 99.3 98.6 97.3  --   PLT 154   < > 196 197 189 156   < > = values in this interval not displayed.   Lipid Panel: Recent Labs    07/14/23 0000  CHOL 125  HDL 51  LDLCALC 53  TRIG 130   No results found for: "HGBA1C"  Procedures since last visit: No results found.  Assessment/Plan 1. Bilateral leg edema (Primary) Possible Fluid retention Will Start on Torsemide 20 mg for 1 week with Potassium  For 1 week and then Reval Dopplers are negative  Check BMP and Pro BNP in 1 week  2. Pain and swelling of right lower leg and Right Shoulder pain No Injury or fall Xray negative of Ankle Cannot do NSAIDS due to he rh/o GI bleed Will Start on Prednisone  10 mg every day for 1 week If not better Ortho Eval  3. Blood loss anemia HGB done few weeks ago showed hgb of 11.2 B12 little lower side Wills tart on B 12 1000 mcg   Labs/tests ordered:  * No order type specified * Next appt:  Visit date not found

## 2023-08-15 DIAGNOSIS — R41841 Cognitive communication deficit: Secondary | ICD-10-CM | POA: Diagnosis not present

## 2023-08-15 DIAGNOSIS — R1312 Dysphagia, oropharyngeal phase: Secondary | ICD-10-CM | POA: Diagnosis not present

## 2023-08-16 ENCOUNTER — Encounter: Payer: Self-pay | Admitting: Internal Medicine

## 2023-08-19 DIAGNOSIS — R1312 Dysphagia, oropharyngeal phase: Secondary | ICD-10-CM | POA: Diagnosis not present

## 2023-08-19 DIAGNOSIS — R41841 Cognitive communication deficit: Secondary | ICD-10-CM | POA: Diagnosis not present

## 2023-08-21 ENCOUNTER — Non-Acute Institutional Stay: Payer: Self-pay | Admitting: Internal Medicine

## 2023-08-21 ENCOUNTER — Encounter: Payer: Self-pay | Admitting: Internal Medicine

## 2023-08-21 DIAGNOSIS — G8929 Other chronic pain: Secondary | ICD-10-CM

## 2023-08-21 DIAGNOSIS — R600 Localized edema: Secondary | ICD-10-CM

## 2023-08-21 DIAGNOSIS — M25571 Pain in right ankle and joints of right foot: Secondary | ICD-10-CM | POA: Diagnosis not present

## 2023-08-21 DIAGNOSIS — I10 Essential (primary) hypertension: Secondary | ICD-10-CM | POA: Diagnosis not present

## 2023-08-21 DIAGNOSIS — R0602 Shortness of breath: Secondary | ICD-10-CM | POA: Diagnosis not present

## 2023-08-21 DIAGNOSIS — M25511 Pain in right shoulder: Secondary | ICD-10-CM

## 2023-08-21 NOTE — Progress Notes (Signed)
 Location: [facility] Service:  [facility] [phone])  Provider:   Code Status:  Goals of Care:     08/04/2023    2:43 PM  Advanced Directives  Does Patient Have a Medical Advance Directive? Yes  Type of Advance Directive Out of facility DNR (pink MOST or yellow form)  Does patient want to make changes to medical advance directive? No - Patient declined     Chief Complaint  Patient presents with   Acute Visit    HPI: Patient is a 83 y.o. female seen today for an acute visit for Follow up of her Right leg and Ankle pain and Right shoulder Pain  L[facility] [place] in [facility]   Patient has a history of recurrent GI bleed with multiple workup, COPD on nocturnal oxygen she is former smoker, history of pulmonary nodules, hypertension, osteoporosis, urge incontinence   Seen last week for Right Lower leg swelling and Pain  Her Ankle Xray is negative for any New Fractures Just showed old healed malleolus fractures. Her  Dopplers were negative for any DVT Started on Torsemide Also Low dose prednisone for 1 week  Patient says she still has Pain in the Ankle when she walks Swelling is better Also c/o Pain in her Shoulder due to using the walker    Past Medical History:  Diagnosis Date   Anxiety    Arthritis    "bilateral knees, shoulders, elbows; neck, pretty widespread" (09/05/2017)   BPPV (benign paroxysmal positional vertigo)    Depression    GERD (gastroesophageal reflux disease)    Glaucoma, both eyes    Headache    "probably 2/month" (09/05/2017)   History of blood transfusion ~ 2008   "related to LGIB"   Hyperlipemia    Lower GI bleeding ~ 2008; 09/05/2017   "had to have blood transfusion"   Macular degeneration, bilateral    Osteopenia    Seasonal allergies    Skin cancer, basal cell 2001   "off my nose, left side"   Sleeping excessive    Tinnitus of both ears     Past Surgical History:  Procedure Laterality Date   BALLOON DILATION N/A 05/08/2021   Procedure:  BALLOON DILATION;  Surgeon: Kerin Salen, MD;  Location: [facility] ENDOSCOPY;  Service: Gastroenterology;  Laterality: N/A;   BASAL CELL CARCINOMA EXCISION  2001   "off my nose, left side"   BIOPSY  05/08/2021   Procedure: BIOPSY;  Surgeon: Kerin Salen, MD;  Location: [facility] ENDOSCOPY;  Service: Gastroenterology;;   BIOPSY  04/25/2023   Procedure: BIOPSY;  Surgeon: Charlott Rakes, MD;  Location: [facility] ENDOSCOPY;  Service: Gastroenterology;;   BLEPHAROPLASTY Bilateral    CATARACT EXTRACTION W/ INTRAOCULAR LENS  IMPLANT, BILATERAL Bilateral 1990's   COLONOSCOPY WITH PROPOFOL N/A 05/04/2022   Procedure: COLONOSCOPY WITH PROPOFOL;  Surgeon: Kerin Salen, MD;  Location: [facility] ENDOSCOPY;  Service: Gastroenterology;  Laterality: N/A;   COLONOSCOPY WITH PROPOFOL N/A 04/25/2023   Procedure: COLONOSCOPY WITH PROPOFOL;  Surgeon: Charlott Rakes, MD;  Location: [facility] ENDOSCOPY;  Service: Gastroenterology;  Laterality: N/A;   ESOPHAGOGASTRODUODENOSCOPY (EGD) WITH PROPOFOL N/A 05/08/2021   Procedure: ESOPHAGOGASTRODUODENOSCOPY (EGD) WITH PROPOFOL;  Surgeon: Kerin Salen, MD;  Location: [facility] ENDOSCOPY;  Service: Gastroenterology;  Laterality: N/A;   ESOPHAGOGASTRODUODENOSCOPY (EGD) WITH PROPOFOL N/A 01/11/2022   Procedure: ESOPHAGOGASTRODUODENOSCOPY (EGD) WITH PROPOFOL;  Surgeon: Vida Rigger, MD;  Location: [facility] ENDOSCOPY;  Service: Gastroenterology;  Laterality: N/A;   ESOPHAGOGASTRODUODENOSCOPY (EGD) WITH PROPOFOL N/A 01/06/2023   Procedure: ESOPHAGOGASTRODUODENOSCOPY (EGD)  WITH PROPOFOL;  Surgeon: Vida Rigger, MD;  Location: [facility] ENDOSCOPY;  Service: Gastroenterology;  Laterality: N/A;   ESOPHAGOGASTRODUODENOSCOPY (EGD) WITH PROPOFOL N/A 04/21/2023   Procedure: ESOPHAGOGASTRODUODENOSCOPY (EGD) WITH PROPOFOL;  Surgeon: Charlott Rakes, MD;  Location: [facility] ENDOSCOPY;  Service: Gastroenterology;  Laterality: N/A;   EYE SURGERY Bilateral    "to improve vision after cataract OR"   GIVENS CAPSULE STUDY N/A 04/21/2023   Procedure: GIVENS  CAPSULE STUDY;  Surgeon: Charlott Rakes, MD;  Location: [facility] ENDOSCOPY;  Service: Gastroenterology;  Laterality: N/A;   IR ANGIOGRAM FOLLOW UP STUDY  12/19/2020   IR ANGIOGRAM SELECTIVE EACH ADDITIONAL VESSEL  12/19/2020   IR ANGIOGRAM SELECTIVE EACH ADDITIONAL VESSEL  12/19/2020   IR ANGIOGRAM VISCERAL SELECTIVE  12/19/2020   IR EMBO ART  VEN HEMORR LYMPH EXTRAV  INC GUIDE ROADMAPPING  12/19/2020   IR US GUIDE VASC ACCESS RIGHT  12/19/2020   JOINT REPLACEMENT     POLYPECTOMY  05/04/2022   Procedure: POLYPECTOMY;  Surgeon: Kerin Salen, MD;  Location: [facility] ENDOSCOPY;  Service: Gastroenterology;;   STAPEDES SURGERY Left    "scraped stapedes because it was sticking when it wasn't suppose to"   TONSILLECTOMY AND ADENOIDECTOMY  1946   TOTAL KNEE ARTHROPLASTY Left ~ 2008   TOTAL KNEE ARTHROPLASTY  12/20/2011   Procedure: TOTAL KNEE ARTHROPLASTY;  Surgeon: Verlee Rossetti, MD;  Location: [facility] OR;  Service: Orthopedics;  Laterality: Right;  Right Total Knee Arthroplasty   TUBAL LIGATION  1980's    Allergies  Allergen Reactions   Morphine And Codeine Itching   Aspirin Other (See Comments)    Unknown reaction   Duloxetine Hcl Other (See Comments)    Unknown reaction   Tymlos [Abaloparatide] Other (See Comments)    Unknown reaction   Ventolin [Albuterol] Other (See Comments)    rapid heart beat   Cefadroxil Hives    Patient can take amoxicillin and cipro    Outpatient Encounter Medications as of 08/21/2023  Medication Sig   acetaminophen (TYLENOL) 500 MG tablet Take 1,000 mg by mouth every 8 (eight) hours as needed for moderate pain (pain score 4-6).   acetaminophen (TYLENOL) 500 MG tablet Take 1,000 mg by mouth in the morning and at bedtime.   acetaminophen (TYLENOL) 650 MG CR tablet Take 1,300 mg by mouth at bedtime.   albuterol (VENTOLIN HFA) 108 (90 Base) MCG/ACT inhaler Inhale 2 puffs into the lungs every 4 (four) hours as needed for wheezing or shortness of breath.   alendronate (FOSAMAX) 70  MG tablet Take 70 mg by mouth every Sunday. Take with a full glass of water on an empty stomach.   Ascorbic Acid (VITAMIN C) 1000 MG tablet Take 1,000 mg by mouth every morning.   cetirizine (ZYRTEC) 5 MG chewable tablet Chew 5 mg by mouth daily as needed for allergies.   Cholecalciferol (VITAMIN D3) 50 MCG (2000 UT) TABS Take 2,000 Units by mouth every morning.   cyanocobalamin (VITAMIN B12) 1000 MCG tablet Take 1,000 mcg by mouth daily.   docusate sodium (COLACE) 100 MG capsule Take 200 mg by mouth at bedtime.   Dorzolamide HCl-Timolol Mal PF 2-0.5 % SOLN Place 1 drop into both eyes in the morning and at bedtime.   ferrous gluconate (FERGON) 240 (27 FE) MG tablet Take 240 mg by mouth every morning. Take without food   fluticasone (FLONASE) 50 MCG/ACT nasal spray Place 1 spray into both nostrils daily as needed for allergies or rhinitis.   guaiFENesin (ROBITUSSIN) 100 MG/5ML liquid  Take 10 mLs by mouth every 4 (four) hours as needed for cough or to loosen phlegm.   latanoprost (XALATAN) 0.005 % ophthalmic solution Place 1 drop into both eyes at bedtime.   Multiple Vitamins-Minerals (PRESERVISION AREDS 2) CAPS Take 1 capsule by mouth 2 (two) times daily.   Netarsudil Dimesylate (RHOPRESSA) 0.02 % SOLN Place 1 drop into both eyes at bedtime.   oxybutynin (DITROPAN-XL) 5 MG 24 hr tablet Take 5 mg by mouth at bedtime.   pantoprazole (PROTONIX) 40 MG tablet Take 40 mg by mouth 2 (two) times daily.   polyethylene glycol (MIRALAX / GLYCOLAX) 17 g packet Take 8.5 g by mouth daily as needed (constipation).   potassium chloride SA (KLOR-CON M) 20 MEQ tablet Take 20 mEq by mouth once.   predniSONE (DELTASONE) 10 MG tablet Take 10 mg by mouth daily with breakfast.   Probiotic Product (ALIGN) 4 MG CAPS Take 4 mg by mouth at bedtime.   rOPINIRole (REQUIP) 0.5 MG tablet Take 1 tablet (0.5 mg total) by mouth daily as needed.   sertraline (ZOLOFT) 50 MG tablet Take 1.5 tablets (75 mg total) by mouth daily.    simvastatin (ZOCOR) 20 MG tablet Take 20 mg by mouth every evening.   Sodium Fluoride (PREVIDENT 5000 BOOSTER PLUS) 1.1 % PSTE Place 1 Application onto teeth See admin instructions. Brush on teeth with a toothbrush after evening mouth care. Spit out excess and do not rinse.   torsemide (DEMADEX) 20 MG tablet Take 20 mg by mouth daily.   umeclidinium-vilanterol (ANORO ELLIPTA) 62.5-25 MCG/ACT AEPB Inhale 1 puff into the lungs every morning.   zinc oxide 20 % ointment Apply 1 Application topically See admin instructions. Apply topically to buttocks after every incontinent episode and as needed for redness   No facility-administered encounter medications on file as of 08/21/2023.    Review of Systems:  Review of Systems  Constitutional:  Negative for activity change and appetite change.  HENT: Negative.    Respiratory:  Negative for cough and shortness of breath.   Cardiovascular:  Positive for leg swelling.  Gastrointestinal:  Negative for constipation.  Genitourinary: Negative.   Musculoskeletal:  Positive for arthralgias and gait problem. Negative for myalgias.  Skin: Negative.   Neurological:  Negative for dizziness and weakness.  Psychiatric/Behavioral:  Negative for confusion, dysphoric mood and sleep disturbance.     Health Maintenance  Topic Date Due   DEXA SCAN  Never done   COVID-19 Vaccine (4 - 2024-25 season) 03/02/2023   Pneumonia Vaccine 38+ Years old (1 of 2 - PCV) 10/03/2023 (Originally 11/13/1946)   Medicare Annual Wellness (AWV)  04/03/2024   DTaP/Tdap/Td (2 - Td or Tdap) 03/12/2028   INFLUENZA VACCINE  Completed   Zoster Vaccines- Shingrix  Completed   HPV VACCINES  Aged Out    Physical Exam: There were no vitals filed for this visit. There is no height or weight on file to calculate BMI. Physical Exam Vitals reviewed.  Constitutional:      Appearance: Normal appearance.  HENT:     Head: Normocephalic.     Nose: Nose normal.     Mouth/Throat:     Mouth:  Mucous membranes are moist.     Pharynx: Oropharynx is clear.  Eyes:     Pupils: Pupils are equal, round, and reactive to light.  Cardiovascular:     Rate and Rhythm: Normal rate and regular rhythm.     Pulses: Normal pulses.     Heart sounds: Normal  heart sounds. No murmur heard. Pulmonary:     Effort: Pulmonary effort is normal.     Breath sounds: Normal breath sounds.  Abdominal:     General: Abdomen is flat. Bowel sounds are normal.     Palpations: Abdomen is soft.  Musculoskeletal:        General: No swelling.     Cervical back: Neck supple.     Comments: Swelling in the leg is better Continues to have pain in her Ankle area Also in her right shoulder  Skin:    General: Skin is warm.  Neurological:     General: No focal deficit present.     Mental Status: She is alert and oriented to person, place, and time.  Psychiatric:        Mood and Affect: Mood normal.        Thought Content: Thought content normal.     Labs reviewed: Basic Metabolic Panel: Recent Labs    10/08/22 0000 12/23/22 0000 04/25/23 0322 04/27/23 0629 04/29/23 0541 05/09/23 0000  NA 140   < > 142 140 141 143  K 4.5   < > 3.6 3.8 3.7 4.2  CL 106   < > 112* 106 106 107  CO2 29*   < > 24 27 26  26*  GLUCOSE  --    < > 108* 120* 108*  --   BUN 11   < > 6* 10 8 11   CREATININE 0.5   < > 0.44 0.48 0.52 0.6  CALCIUM 9.2   < > 8.2* 8.6* 8.7* 9.1  TSH 2.47  --   --   --   --   --    < > = values in this interval not displayed.   Liver Function Tests: Recent Labs    03/29/23 0843 04/03/23 0000 04/19/23 1144 04/20/23 0739  AST 16 14 16 16   ALT 10 9 9 9   ALKPHOS 40 48 31* 27*  BILITOT 0.6  --  0.6 1.1  PROT 6.7  --  5.8* 5.0*  ALBUMIN 3.8 4.2 3.4* 3.0*   No results for input(s): "LIPASE", "AMYLASE" in the last 8760 hours. No results for input(s): "AMMONIA" in the last 8760 hours. CBC: Recent Labs    04/19/23 1144 04/19/23 1227 04/26/23 1025 04/27/23 0629 04/29/23 0630 07/14/23 0000   WBC 7.9   < > 5.7 5.5 5.2 4.6  NEUTROABS 5.3  --  3.7  --   --  2,640.00  HGB 5.9*   < > 8.8* 8.4* 8.0* 11.7*  HCT 19.2*   < > 28.8* 27.2* 25.6* 37  MCV 103.8*   < > 99.3 98.6 97.3  --   PLT 154   < > 196 197 189 156   < > = values in this interval not displayed.   Lipid Panel: Recent Labs    07/14/23 0000  CHOL 125  HDL 51  LDLCALC 53  TRIG 130   No results found for: "HGBA1C"  Procedures since last visit: No results found.  Assessment/Plan 1. Bilateral leg edema (Primary) Will Continue Torsemide and Potassium BMP Pending  2. Chronic pain of right ankle Refer to Ortho  3. Chronic right shoulder pain Refer to ortho    Labs/tests ordered:  * No order type specified * Next appt:  Visit date not found

## 2023-08-22 DIAGNOSIS — R41841 Cognitive communication deficit: Secondary | ICD-10-CM | POA: Diagnosis not present

## 2023-08-22 DIAGNOSIS — R1312 Dysphagia, oropharyngeal phase: Secondary | ICD-10-CM | POA: Diagnosis not present

## 2023-08-26 DIAGNOSIS — R41841 Cognitive communication deficit: Secondary | ICD-10-CM | POA: Diagnosis not present

## 2023-08-26 DIAGNOSIS — R1312 Dysphagia, oropharyngeal phase: Secondary | ICD-10-CM | POA: Diagnosis not present

## 2023-08-29 DIAGNOSIS — R1312 Dysphagia, oropharyngeal phase: Secondary | ICD-10-CM | POA: Diagnosis not present

## 2023-08-29 DIAGNOSIS — R41841 Cognitive communication deficit: Secondary | ICD-10-CM | POA: Diagnosis not present

## 2023-09-01 DIAGNOSIS — R41841 Cognitive communication deficit: Secondary | ICD-10-CM | POA: Diagnosis not present

## 2023-09-01 DIAGNOSIS — R1312 Dysphagia, oropharyngeal phase: Secondary | ICD-10-CM | POA: Diagnosis not present

## 2023-09-09 DIAGNOSIS — R1312 Dysphagia, oropharyngeal phase: Secondary | ICD-10-CM | POA: Diagnosis not present

## 2023-09-09 DIAGNOSIS — R41841 Cognitive communication deficit: Secondary | ICD-10-CM | POA: Diagnosis not present

## 2023-09-11 DIAGNOSIS — R41841 Cognitive communication deficit: Secondary | ICD-10-CM | POA: Diagnosis not present

## 2023-09-11 DIAGNOSIS — R1312 Dysphagia, oropharyngeal phase: Secondary | ICD-10-CM | POA: Diagnosis not present

## 2023-09-16 DIAGNOSIS — R1312 Dysphagia, oropharyngeal phase: Secondary | ICD-10-CM | POA: Diagnosis not present

## 2023-09-16 DIAGNOSIS — R41841 Cognitive communication deficit: Secondary | ICD-10-CM | POA: Diagnosis not present

## 2023-09-17 ENCOUNTER — Non-Acute Institutional Stay: Payer: Self-pay | Admitting: Orthopedic Surgery

## 2023-09-17 ENCOUNTER — Encounter: Payer: Self-pay | Admitting: Orthopedic Surgery

## 2023-09-17 DIAGNOSIS — R600 Localized edema: Secondary | ICD-10-CM | POA: Diagnosis not present

## 2023-09-17 DIAGNOSIS — L602 Onychogryphosis: Secondary | ICD-10-CM

## 2023-09-17 NOTE — Progress Notes (Signed)
 Location:  [facility] Room Number: 20/A Place of Service:  ALF [phone]) Provider:  Octavia Heir, NP   Octavia Heir, NP  Patient Care Team: Octavia Heir, NP as PCP - General (Adult Health Nurse Practitioner) [name], MD as Consulting Physician (Internal Medicine)  Extended Emergency Contact Information Primary Emergency Contact: Brackins,[name] Address: [address]          [place], [place] [address] [name] Home Phone: [phone] Mobile Phone: [phone] Relation: Spouse Secondary Emergency Contact: Pell MD,[name] Jethro Poling Phone: [phone] Relation: Son Interpreter needed? No  Code Status:  DNR Goals of care: Advanced Directive information    08/04/2023    2:43 PM  Advanced Directives  Does Patient Have a Medical Advance Directive? Yes  Type of Advance Directive Out of facility DNR (pink MOST or yellow form)  Does patient want to make changes to medical advance directive? No - Patient declined     Chief Complaint  Patient presents with   Acute Visit    Thick toenails    HPI:  Pt is a 83 y.o. female seen today for acute visit due to thick toenails.   She currently resides on the assisted living unit at [facility]. PMH: HTN, COPD, pulmonary nodule, Diverticulosis with GI bleed, GERD, OA, osteoporosis,macular degeneration, iron def anemia, unstable gait, urinary incontinence, anxiety, and depression.   She reports thick toenails this morning. Toenails are catching on socks, causing pressure on big toes. She reports discomfort to feet when this occurs. She is wearing compression stockings daily due to ongoing bilateral leg edema. She is also taking furosemide and KCL. She has seen podiatry in past, unclear when last visit was. Toenails appear well trimmed today. We were able to put compression stockings on without difficulty. Afebrile. Vitals stable.    Past Medical History:  Diagnosis Date   Anxiety    Arthritis     "bilateral knees, shoulders, elbows; neck, pretty widespread" (09/05/2017)   BPPV (benign paroxysmal positional vertigo)    Depression    GERD (gastroesophageal reflux disease)    Glaucoma, both eyes    Headache    "probably 2/month" (09/05/2017)   History of blood transfusion ~ 2008   "related to LGIB"   Hyperlipemia    Lower GI bleeding ~ 2008; 09/05/2017   "had to have blood transfusion"   Macular degeneration, bilateral    Osteopenia    Seasonal allergies    Skin cancer, basal cell 2001   "off my nose, left side"   Sleeping excessive    Tinnitus of both ears    Past Surgical History:  Procedure Laterality Date   BALLOON DILATION N/A 05/08/2021   Procedure: BALLOON DILATION;  Surgeon: Kerin Salen, MD;  Location: [facility] ENDOSCOPY;  Service: Gastroenterology;  Laterality: N/A;   BASAL CELL CARCINOMA EXCISION  2001   "off my nose, left side"   BIOPSY  05/08/2021   Procedure: BIOPSY;  Surgeon: Kerin Salen, MD;  Location: [facility] ENDOSCOPY;  Service: Gastroenterology;;   BIOPSY  04/25/2023   Procedure: BIOPSY;  Surgeon: Charlott Rakes, MD;  Location: [facility] ENDOSCOPY;  Service: Gastroenterology;;   BLEPHAROPLASTY Bilateral    CATARACT EXTRACTION W/ INTRAOCULAR LENS  IMPLANT, BILATERAL Bilateral 1990's   COLONOSCOPY WITH PROPOFOL N/A 05/04/2022   Procedure: COLONOSCOPY WITH PROPOFOL;  Surgeon: Kerin Salen, MD;  Location: [facility] ENDOSCOPY;  Service: Gastroenterology;  Laterality: N/A;   COLONOSCOPY WITH PROPOFOL N/A 04/25/2023   Procedure: COLONOSCOPY WITH PROPOFOL;  Surgeon: Charlott Rakes, MD;  Location: [facility] ENDOSCOPY;  Service: Gastroenterology;  Laterality: N/A;   ESOPHAGOGASTRODUODENOSCOPY (EGD) WITH PROPOFOL N/A 05/08/2021   Procedure: ESOPHAGOGASTRODUODENOSCOPY (EGD) WITH PROPOFOL;  Surgeon: Kerin Salen, MD;  Location: [facility] ENDOSCOPY;  Service: Gastroenterology;  Laterality: N/A;   ESOPHAGOGASTRODUODENOSCOPY (EGD) WITH PROPOFOL N/A 01/11/2022   Procedure: ESOPHAGOGASTRODUODENOSCOPY (EGD) WITH  PROPOFOL;  Surgeon: Vida Rigger, MD;  Location: [facility] ENDOSCOPY;  Service: Gastroenterology;  Laterality: N/A;   ESOPHAGOGASTRODUODENOSCOPY (EGD) WITH PROPOFOL N/A 01/06/2023   Procedure: ESOPHAGOGASTRODUODENOSCOPY (EGD) WITH PROPOFOL;  Surgeon: Vida Rigger, MD;  Location: [facility] ENDOSCOPY;  Service: Gastroenterology;  Laterality: N/A;   ESOPHAGOGASTRODUODENOSCOPY (EGD) WITH PROPOFOL N/A 04/21/2023   Procedure: ESOPHAGOGASTRODUODENOSCOPY (EGD) WITH PROPOFOL;  Surgeon: Charlott Rakes, MD;  Location: [facility] ENDOSCOPY;  Service: Gastroenterology;  Laterality: N/A;   EYE SURGERY Bilateral    "to improve vision after cataract OR"   GIVENS CAPSULE STUDY N/A 04/21/2023   Procedure: GIVENS CAPSULE STUDY;  Surgeon: Charlott Rakes, MD;  Location: [facility] ENDOSCOPY;  Service: Gastroenterology;  Laterality: N/A;   IR ANGIOGRAM FOLLOW UP STUDY  12/19/2020   IR ANGIOGRAM SELECTIVE EACH ADDITIONAL VESSEL  12/19/2020   IR ANGIOGRAM SELECTIVE EACH ADDITIONAL VESSEL  12/19/2020   IR ANGIOGRAM VISCERAL SELECTIVE  12/19/2020   IR EMBO ART  VEN HEMORR LYMPH EXTRAV  INC GUIDE ROADMAPPING  12/19/2020   IR US GUIDE VASC ACCESS RIGHT  12/19/2020   JOINT REPLACEMENT     POLYPECTOMY  05/04/2022   Procedure: POLYPECTOMY;  Surgeon: Kerin Salen, MD;  Location: [facility] ENDOSCOPY;  Service: Gastroenterology;;   STAPEDES SURGERY Left    "scraped stapedes because it was sticking when it wasn't suppose to"   TONSILLECTOMY AND ADENOIDECTOMY  1946   TOTAL KNEE ARTHROPLASTY Left ~ 2008   TOTAL KNEE ARTHROPLASTY  12/20/2011   Procedure: TOTAL KNEE ARTHROPLASTY;  Surgeon: Verlee Rossetti, MD;  Location: [facility] OR;  Service: Orthopedics;  Laterality: Right;  Right Total Knee Arthroplasty   TUBAL LIGATION  1980's    Allergies  Allergen Reactions   Morphine And Codeine Itching   Aspirin Other (See Comments)    Unknown reaction   Duloxetine Hcl Other (See Comments)    Unknown reaction   Tymlos [Abaloparatide] Other (See Comments)    Unknown reaction    Ventolin [Albuterol] Other (See Comments)    rapid heart beat   Cefadroxil Hives    Patient can take amoxicillin and cipro    Outpatient Encounter Medications as of 09/17/2023  Medication Sig   acetaminophen (TYLENOL) 500 MG tablet Take 1,000 mg by mouth in the morning and at bedtime.   acetaminophen (TYLENOL) 650 MG CR tablet Take 1,300 mg by mouth at bedtime.   albuterol (VENTOLIN HFA) 108 (90 Base) MCG/ACT inhaler Inhale 2 puffs into the lungs every 4 (four) hours as needed for wheezing or shortness of breath.   alendronate (FOSAMAX) 70 MG tablet Take 70 mg by mouth every Sunday. Take with a full glass of water on an empty stomach.   Ascorbic Acid (VITAMIN C) 1000 MG tablet Take 1,000 mg by mouth every morning.   cetirizine (ZYRTEC) 5 MG chewable tablet Chew 5 mg by mouth daily as needed for allergies.   Cholecalciferol (VITAMIN D3) 50 MCG (2000 UT) TABS Take 2,000 Units by mouth every morning.   cyanocobalamin (VITAMIN B12) 1000 MCG tablet Take 1,000 mcg by mouth daily.   docusate sodium (COLACE) 100 MG capsule Take 200 mg by mouth at bedtime.   Dorzolamide HCl-Timolol Mal PF  2-0.5 % SOLN Place 1 drop into both eyes in the morning and at bedtime.   ferrous gluconate (FERGON) 240 (27 FE) MG tablet Take 240 mg by mouth every morning. Take without food   fluticasone (FLONASE) 50 MCG/ACT nasal spray Place 1 spray into both nostrils daily as needed for allergies or rhinitis.   guaiFENesin (ROBITUSSIN) 100 MG/5ML liquid Take 10 mLs by mouth every 4 (four) hours as needed for cough or to loosen phlegm.   latanoprost (XALATAN) 0.005 % ophthalmic solution Place 1 drop into both eyes at bedtime.   Multiple Vitamins-Minerals (PRESERVISION AREDS 2) CAPS Take 1 capsule by mouth 2 (two) times daily.   Netarsudil Dimesylate (RHOPRESSA) 0.02 % SOLN Place 1 drop into both eyes at bedtime.   oxybutynin (DITROPAN-XL) 5 MG 24 hr tablet Take 5 mg by mouth at bedtime.   pantoprazole (PROTONIX) 40 MG tablet  Take 40 mg by mouth 2 (two) times daily.   polyethylene glycol (MIRALAX / GLYCOLAX) 17 g packet Take 8.5 g by mouth daily as needed (constipation).   potassium chloride SA (KLOR-CON M) 20 MEQ tablet Take 20 mEq by mouth once.   Probiotic Product (ALIGN) 4 MG CAPS Take 4 mg by mouth at bedtime.   rOPINIRole (REQUIP) 0.5 MG tablet Take 1 tablet (0.5 mg total) by mouth daily as needed.   sertraline (ZOLOFT) 50 MG tablet Take 1.5 tablets (75 mg total) by mouth daily.   simvastatin (ZOCOR) 20 MG tablet Take 20 mg by mouth every evening.   Sodium Fluoride (PREVIDENT 5000 BOOSTER PLUS) 1.1 % PSTE Place 1 Application onto teeth See admin instructions. Brush on teeth with a toothbrush after evening mouth care. Spit out excess and do not rinse.   torsemide (DEMADEX) 20 MG tablet Take 20 mg by mouth daily.   umeclidinium-vilanterol (ANORO ELLIPTA) 62.5-25 MCG/ACT AEPB Inhale 1 puff into the lungs every morning.   zinc oxide 20 % ointment Apply 1 Application topically See admin instructions. Apply topically to buttocks after every incontinent episode and as needed for redness   No facility-administered encounter medications on file as of 09/17/2023.    Review of Systems  Constitutional:  Negative for fatigue and fever.  Respiratory:  Negative for shortness of breath.   Cardiovascular:  Positive for leg swelling. Negative for chest pain.  Musculoskeletal:  Positive for gait problem.  Psychiatric/Behavioral:  Negative for dysphoric mood. The patient is not nervous/anxious.     Immunization History  Administered Date(s) Administered   Fluad Quad(high Dose 65+) 04/24/2022   Fluad Trivalent(High Dose 65+) 04/27/2023   Influenza Inj Mdck Quad Pf 04/12/2017   Influenza, High Dose Seasonal PF 03/14/2018   Influenza, Quadrivalent, Recombinant, Inj, Pf 03/05/2019, 05/22/2021   PFIZER(Purple Top)SARS-COV-2 Vaccination 07/25/2019, 08/16/2019, 12/12/2021   Pneumococcal-Unspecified 03/02/2011   Tdap 03/12/2018    Zoster Recombinant(Shingrix) 10/08/2017, 12/09/2017   Pertinent  Health Maintenance Due  Topic Date Due   DEXA SCAN  Never done   INFLUENZA VACCINE  Completed      07/12/2022   11:08 AM 10/30/2022   12:44 PM 01/22/2023    1:15 PM 04/04/2023   11:00 AM 07/10/2023    9:02 AM  Fall Risk  Falls in the past year? 0 0 0 0 0  Was there an injury with Fall? 0 0 0 0 0  Fall Risk Category Calculator 0 0 0 0 0  Fall Risk Category (Retired) Low      (RETIRED) Patient Fall Risk Level Low fall risk  Patient at Risk for Falls Due to History of fall(s) History of fall(s);Impaired balance/gait;Impaired mobility History of fall(s);Impaired balance/gait History of fall(s);Impaired balance/gait;Impaired mobility History of fall(s);Impaired balance/gait;Impaired mobility  Fall risk Follow up Falls evaluation completed Falls evaluation completed;Education provided;Falls prevention discussed Falls evaluation completed;Education provided;Falls prevention discussed Falls evaluation completed;Education provided;Falls prevention discussed Falls evaluation completed;Education provided;Falls prevention discussed   Functional Status Survey:    Vitals:   09/17/23 1351  BP: (!) 123/57  Pulse: 69  Resp: 20  Temp: 97.9 F (36.6 C)  SpO2: 92%  Weight: 174 lb 12.8 oz (79.3 kg)  Height: 5\' 1"  (1.549 m)   Body mass index is 33.03 kg/m. Physical Exam Vitals reviewed.  Constitutional:      General: She is not in acute distress. HENT:     Head: Normocephalic.  Eyes:     General:        Right eye: No discharge.        Left eye: No discharge.  Cardiovascular:     Rate and Rhythm: Normal rate and regular rhythm.     Pulses:          Dorsalis pedis pulses are 1+ on the right side and 1+ on the left side.       Posterior tibial pulses are 1+ on the right side and 1+ on the left side.     Heart sounds: Normal heart sounds.  Pulmonary:     Effort: Pulmonary effort is normal.     Breath sounds: Normal breath  sounds.  Abdominal:     Palpations: Abdomen is soft.  Musculoskeletal:     Cervical back: Neck supple.     Right lower leg: Edema present.     Left lower leg: Edema present.     Comments: Non pitting  Feet:     Right foot:     Protective Sensation: 10 sites tested.  10 sites sensed.     Skin integrity: Skin integrity normal.     Toenail Condition: Right toenails are abnormally thick. Fungal disease present.    Left foot:     Protective Sensation: 10 sites tested.  10 sites sensed.     Skin integrity: Skin integrity normal.     Toenail Condition: Left toenails are abnormally thick. Fungal disease present. Skin:    General: Skin is warm.     Capillary Refill: Capillary refill takes less than 2 seconds.  Neurological:     General: No focal deficit present.     Mental Status: She is alert and oriented to person, place, and time.     Gait: Gait abnormal.  Psychiatric:        Mood and Affect: Mood normal.     Labs reviewed: Recent Labs    04/25/23 0322 04/27/23 0629 04/29/23 0541 05/09/23 0000  NA 142 140 141 143  K 3.6 3.8 3.7 4.2  CL 112* 106 106 107  CO2 24 27 26  26*  GLUCOSE 108* 120* 108*  --   BUN 6* 10 8 11   CREATININE 0.44 0.48 0.52 0.6  CALCIUM 8.2* 8.6* 8.7* 9.1   Recent Labs    03/29/23 0843 04/03/23 0000 04/19/23 1144 04/20/23 0739  AST 16 14 16 16   ALT 10 9 9 9   ALKPHOS 40 48 31* 27*  BILITOT 0.6  --  0.6 1.1  PROT 6.7  --  5.8* 5.0*  ALBUMIN 3.8 4.2 3.4* 3.0*   Recent Labs    04/19/23 1144 04/19/23 1227 04/26/23 1025 04/27/23  5284 04/29/23 0630 07/14/23 0000  WBC 7.9   < > 5.7 5.5 5.2 4.6  NEUTROABS 5.3  --  3.7  --   --  2,640.00  HGB 5.9*   < > 8.8* 8.4* 8.0* 11.7*  HCT 19.2*   < > 28.8* 27.2* 25.6* 37  MCV 103.8*   < > 99.3 98.6 97.3  --   PLT 154   < > 196 197 189 156   < > = values in this interval not displayed.   Lab Results  Component Value Date   TSH 2.47 10/08/2022   No results found for: "HGBA1C" Lab Results  Component  Value Date   CHOL 125 07/14/2023   HDL 51 07/14/2023   LDLCALC 53 07/14/2023   TRIG 130 07/14/2023    Significant Diagnostic Results in last 30 days:  No results found.  Assessment/Plan 1. Onychauxis (Primary) - think, yellow toenails on exam - seen podiatry in past but not recently - toenails appropriate length  - recommend rescheduling with podiatry if they become longer/painful  2. Bilateral leg edema - ongoing - began as right leg swelling> Doppler negative for DVT/ xray negative for acute fracture - completed prednisone taper - now on low dose torsemide/KCL - BUN/creat 15/0.57 08/21/2023 - non pitting edema today - cont compression stockings> see above> consider going one size up if they continue to bother toenails    Family/ staff Communication: plan discussed with patient and nurse  Labs/tests ordered:  none

## 2023-09-18 DIAGNOSIS — R41841 Cognitive communication deficit: Secondary | ICD-10-CM | POA: Diagnosis not present

## 2023-09-18 DIAGNOSIS — R1312 Dysphagia, oropharyngeal phase: Secondary | ICD-10-CM | POA: Diagnosis not present

## 2023-09-23 DIAGNOSIS — R1312 Dysphagia, oropharyngeal phase: Secondary | ICD-10-CM | POA: Diagnosis not present

## 2023-09-23 DIAGNOSIS — R41841 Cognitive communication deficit: Secondary | ICD-10-CM | POA: Diagnosis not present

## 2023-09-25 DIAGNOSIS — R41841 Cognitive communication deficit: Secondary | ICD-10-CM | POA: Diagnosis not present

## 2023-09-25 DIAGNOSIS — R1312 Dysphagia, oropharyngeal phase: Secondary | ICD-10-CM | POA: Diagnosis not present

## 2023-09-30 DIAGNOSIS — S82401A Unspecified fracture of shaft of right fibula, initial encounter for closed fracture: Secondary | ICD-10-CM | POA: Diagnosis not present

## 2023-09-30 DIAGNOSIS — R1312 Dysphagia, oropharyngeal phase: Secondary | ICD-10-CM | POA: Diagnosis not present

## 2023-09-30 DIAGNOSIS — M25571 Pain in right ankle and joints of right foot: Secondary | ICD-10-CM | POA: Diagnosis not present

## 2023-09-30 DIAGNOSIS — R41841 Cognitive communication deficit: Secondary | ICD-10-CM | POA: Diagnosis not present

## 2023-09-30 DIAGNOSIS — M19011 Primary osteoarthritis, right shoulder: Secondary | ICD-10-CM | POA: Diagnosis not present

## 2023-09-30 DIAGNOSIS — M25511 Pain in right shoulder: Secondary | ICD-10-CM | POA: Diagnosis not present

## 2023-10-02 DIAGNOSIS — R1312 Dysphagia, oropharyngeal phase: Secondary | ICD-10-CM | POA: Diagnosis not present

## 2023-10-02 DIAGNOSIS — R41841 Cognitive communication deficit: Secondary | ICD-10-CM | POA: Diagnosis not present

## 2023-10-07 DIAGNOSIS — R41841 Cognitive communication deficit: Secondary | ICD-10-CM | POA: Diagnosis not present

## 2023-10-07 DIAGNOSIS — R1312 Dysphagia, oropharyngeal phase: Secondary | ICD-10-CM | POA: Diagnosis not present

## 2023-10-09 DIAGNOSIS — R41841 Cognitive communication deficit: Secondary | ICD-10-CM | POA: Diagnosis not present

## 2023-10-09 DIAGNOSIS — R1312 Dysphagia, oropharyngeal phase: Secondary | ICD-10-CM | POA: Diagnosis not present

## 2023-10-14 DIAGNOSIS — R41841 Cognitive communication deficit: Secondary | ICD-10-CM | POA: Diagnosis not present

## 2023-10-14 DIAGNOSIS — R1312 Dysphagia, oropharyngeal phase: Secondary | ICD-10-CM | POA: Diagnosis not present

## 2023-10-15 DIAGNOSIS — R41841 Cognitive communication deficit: Secondary | ICD-10-CM | POA: Diagnosis not present

## 2023-10-15 DIAGNOSIS — R1312 Dysphagia, oropharyngeal phase: Secondary | ICD-10-CM | POA: Diagnosis not present

## 2023-10-21 DIAGNOSIS — R41841 Cognitive communication deficit: Secondary | ICD-10-CM | POA: Diagnosis not present

## 2023-10-21 DIAGNOSIS — R1312 Dysphagia, oropharyngeal phase: Secondary | ICD-10-CM | POA: Diagnosis not present

## 2023-10-23 DIAGNOSIS — R41841 Cognitive communication deficit: Secondary | ICD-10-CM | POA: Diagnosis not present

## 2023-10-23 DIAGNOSIS — R1312 Dysphagia, oropharyngeal phase: Secondary | ICD-10-CM | POA: Diagnosis not present

## 2023-10-24 ENCOUNTER — Observation Stay (HOSPITAL_COMMUNITY)
Admission: EM | Admit: 2023-10-24 | Discharge: 2023-10-27 | Disposition: A | Discharge status: Skilled Nursing Facility | Attending: Family Medicine | Admitting: Family Medicine

## 2023-10-24 ENCOUNTER — Encounter: Payer: Self-pay | Admitting: Orthopedic Surgery

## 2023-10-24 ENCOUNTER — Non-Acute Institutional Stay: Payer: Self-pay | Admitting: Orthopedic Surgery

## 2023-10-24 ENCOUNTER — Other Ambulatory Visit: Payer: Self-pay

## 2023-10-24 DIAGNOSIS — F32A Depression, unspecified: Secondary | ICD-10-CM | POA: Diagnosis not present

## 2023-10-24 DIAGNOSIS — D500 Iron deficiency anemia secondary to blood loss (chronic): Secondary | ICD-10-CM | POA: Diagnosis not present

## 2023-10-24 DIAGNOSIS — R2243 Localized swelling, mass and lump, lower limb, bilateral: Secondary | ICD-10-CM | POA: Diagnosis not present

## 2023-10-24 DIAGNOSIS — K2970 Gastritis, unspecified, without bleeding: Secondary | ICD-10-CM | POA: Insufficient documentation

## 2023-10-24 DIAGNOSIS — H409 Unspecified glaucoma: Secondary | ICD-10-CM | POA: Diagnosis present

## 2023-10-24 DIAGNOSIS — D123 Benign neoplasm of transverse colon: Secondary | ICD-10-CM | POA: Diagnosis not present

## 2023-10-24 DIAGNOSIS — K922 Gastrointestinal hemorrhage, unspecified: Principal | ICD-10-CM | POA: Diagnosis present

## 2023-10-24 DIAGNOSIS — Z87891 Personal history of nicotine dependence: Secondary | ICD-10-CM | POA: Insufficient documentation

## 2023-10-24 DIAGNOSIS — Z85828 Personal history of other malignant neoplasm of skin: Secondary | ICD-10-CM | POA: Diagnosis not present

## 2023-10-24 DIAGNOSIS — F418 Other specified anxiety disorders: Secondary | ICD-10-CM | POA: Diagnosis present

## 2023-10-24 DIAGNOSIS — D649 Anemia, unspecified: Secondary | ICD-10-CM | POA: Insufficient documentation

## 2023-10-24 DIAGNOSIS — J410 Simple chronic bronchitis: Secondary | ICD-10-CM | POA: Diagnosis not present

## 2023-10-24 DIAGNOSIS — K559 Vascular disorder of intestine, unspecified: Secondary | ICD-10-CM | POA: Insufficient documentation

## 2023-10-24 DIAGNOSIS — M150 Primary generalized (osteo)arthritis: Secondary | ICD-10-CM | POA: Diagnosis not present

## 2023-10-24 DIAGNOSIS — K317 Polyp of stomach and duodenum: Secondary | ICD-10-CM | POA: Insufficient documentation

## 2023-10-24 DIAGNOSIS — Z96653 Presence of artificial knee joint, bilateral: Secondary | ICD-10-CM | POA: Diagnosis not present

## 2023-10-24 DIAGNOSIS — F419 Anxiety disorder, unspecified: Secondary | ICD-10-CM | POA: Insufficient documentation

## 2023-10-24 DIAGNOSIS — E782 Mixed hyperlipidemia: Secondary | ICD-10-CM | POA: Diagnosis not present

## 2023-10-24 DIAGNOSIS — R600 Localized edema: Secondary | ICD-10-CM

## 2023-10-24 DIAGNOSIS — Z79899 Other long term (current) drug therapy: Secondary | ICD-10-CM | POA: Insufficient documentation

## 2023-10-24 DIAGNOSIS — K648 Other hemorrhoids: Secondary | ICD-10-CM | POA: Insufficient documentation

## 2023-10-24 DIAGNOSIS — F339 Major depressive disorder, recurrent, unspecified: Secondary | ICD-10-CM | POA: Diagnosis not present

## 2023-10-24 DIAGNOSIS — K5730 Diverticulosis of large intestine without perforation or abscess without bleeding: Secondary | ICD-10-CM | POA: Diagnosis not present

## 2023-10-24 DIAGNOSIS — K921 Melena: Secondary | ICD-10-CM | POA: Diagnosis present

## 2023-10-24 DIAGNOSIS — R000 Tachycardia, unspecified: Secondary | ICD-10-CM | POA: Diagnosis not present

## 2023-10-24 DIAGNOSIS — D122 Benign neoplasm of ascending colon: Secondary | ICD-10-CM | POA: Insufficient documentation

## 2023-10-24 DIAGNOSIS — R609 Edema, unspecified: Secondary | ICD-10-CM | POA: Insufficient documentation

## 2023-10-24 DIAGNOSIS — E785 Hyperlipidemia, unspecified: Secondary | ICD-10-CM | POA: Diagnosis present

## 2023-10-24 DIAGNOSIS — I110 Hypertensive heart disease with heart failure: Secondary | ICD-10-CM | POA: Insufficient documentation

## 2023-10-24 DIAGNOSIS — N3281 Overactive bladder: Secondary | ICD-10-CM | POA: Diagnosis not present

## 2023-10-24 DIAGNOSIS — J449 Chronic obstructive pulmonary disease, unspecified: Secondary | ICD-10-CM | POA: Diagnosis not present

## 2023-10-24 DIAGNOSIS — I10 Essential (primary) hypertension: Secondary | ICD-10-CM | POA: Diagnosis not present

## 2023-10-24 DIAGNOSIS — I5032 Chronic diastolic (congestive) heart failure: Secondary | ICD-10-CM

## 2023-10-24 DIAGNOSIS — K635 Polyp of colon: Secondary | ICD-10-CM | POA: Diagnosis not present

## 2023-10-24 DIAGNOSIS — Z8719 Personal history of other diseases of the digestive system: Secondary | ICD-10-CM | POA: Diagnosis not present

## 2023-10-24 LAB — COMPREHENSIVE METABOLIC PANEL WITH GFR
ALT: 10 U/L (ref 0–44)
AST: 20 U/L (ref 15–41)
Albumin: 3.7 g/dL (ref 3.5–5.0)
Alkaline Phosphatase: 66 U/L (ref 38–126)
Anion gap: 10 (ref 5–15)
BUN: 18 mg/dL (ref 8–23)
CO2: 25 mmol/L (ref 22–32)
Calcium: 9.2 mg/dL (ref 8.9–10.3)
Chloride: 104 mmol/L (ref 98–111)
Creatinine, Ser: 0.64 mg/dL (ref 0.44–1.00)
GFR, Estimated: >60 mL/min (ref >60)
Glucose, Bld: 165 mg/dL — ABNORMAL HIGH (ref 70–99)
Potassium: 3.7 mmol/L (ref 3.5–5.1)
Sodium: 139 mmol/L (ref 135–145)
Total Bilirubin: 0.6 mg/dL (ref 0.0–1.2)
Total Protein: 6.6 g/dL (ref 6.5–8.1)

## 2023-10-24 LAB — CBC
HCT: 36.8 % (ref 36.0–46.0)
Hemoglobin: 11.4 g/dL — ABNORMAL LOW (ref 12.0–15.0)
MCH: 31.6 pg (ref 26.0–34.0)
MCHC: 31 g/dL (ref 30.0–36.0)
MCV: 101.9 fL — ABNORMAL HIGH (ref 80.0–100.0)
Platelets: 179 10*3/uL (ref 150–400)
RBC: 3.61 MIL/uL — ABNORMAL LOW (ref 3.87–5.11)
RDW: 15.9 % — ABNORMAL HIGH (ref 11.5–15.5)
WBC: 9.3 10*3/uL (ref 4.0–10.5)
nRBC: 0 % (ref 0.0–0.2)

## 2023-10-24 MED ORDER — PANTOPRAZOLE SODIUM 40 MG IV SOLR
40.0000 mg | Freq: Once | INTRAVENOUS | Status: AC
  Administered 2023-10-24: 40 mg via INTRAVENOUS
  Filled 2023-10-24: qty 10

## 2023-10-24 NOTE — Progress Notes (Signed)
 Location:  [facility] Room Number: 20/A Place of Service:  ALF [phone]) Provider:  Arnetha Bhat, NP   Arnetha Bhat, NP  Patient Care Team: Arnetha Bhat, NP as PCP - General (Adult Health Nurse Practitioner) Marguerite Shiley, MD as Consulting Physician (Internal Medicine)  Extended Emergency Contact Information Primary Emergency Contact: Weinman,[name] Address: [address]          [place], [place] [address] [place] Home Phone: [phone] Mobile Phone: [phone] Relation: Spouse Secondary Emergency Contact: Dershem MD,[name] Tracey Friday Phone: [phone] Relation: Son Interpreter needed? No  Code Status:  DNR Goals of care: Advanced Directive information    08/04/2023    2:43 PM  Advanced Directives  Does Patient Have a Medical Advance Directive? Yes  Type of Advance Directive Out of facility DNR (pink MOST or yellow form)  Does patient want to make changes to medical advance directive? No - Patient declined     Chief Complaint  Patient presents with   Medical Management of Chronic Issues    HPI:  Pt is a 83 y.o. female seen today for medical management of chronic diseases.    She currently resides on the assisted living unit at [facility]. PMH: HTN, COPD, pulmonary nodule, Diverticulosis with GI bleed, GERD, OA, osteoporosis,macular degeneration, iron  def anemia, unstable gait, urinary incontinence, anxiety, and depression.    Lower leg swelling- R>L, began 08/2023, doppler negative, Xrays negative for right ankle fracture, BNP 104, BUN/creat 15/0.57 08/21/2023, remains on ted hose daily and torsemide H/o diverticular bleed- multiple hospitalizations within past 2 years, (recent hospitalization 10/19-10/29), 10/25 colonoscopy showed moderate proximal colonic ischemic mucosa, biopsies taken> confirmed collagenous colitis, no GI follow up, no episodes of bloody stools, remains on Protonix  Blood loss anemia- see above, admits to  fatigue, remains on ferrous gluconate , hgb 11.6 08/04/2023 OAB/ incontinence- 12/13 urine culture > 100,000 cfu/mL E.coli> resolved with Cipro  x 5 days, continues to have frequency throughout day, remains on oxybutynin  COPD- h/o smoking, stopped using oxygen  qhs, remains on anoro Ellipta   HLD- LDL 72 01/2022, remains on simvastatin  Recurrent depression- related to declining vision/chronic conditions/ living in AL, Na+ 140 08/21/2023, remains on Zoloft  OA- involves multiple joints, knees and shoulders most bothersome, 09/30/2023 received steroid injection to right ankle and shoulder> reports minimal relief, remains on scheduled tylenol    Recent weights:  04/01- 175.5 lbs  03/02- 174.8 lbs  02/05- 176 lbs  Recent blood pressures:  04/23- 102/62  04/16- 122/70  04/19- 118/64     Past Medical History:  Diagnosis Date   Anxiety    Arthritis    "bilateral knees, shoulders, elbows; neck, pretty widespread" (09/05/2017)   BPPV (benign paroxysmal positional vertigo)    Depression    GERD (gastroesophageal reflux disease)    Glaucoma, both eyes    Headache    "probably 2/month" (09/05/2017)   History of blood transfusion ~ 2008   "related to LGIB"   Hyperlipemia    Lower GI bleeding ~ 2008; 09/05/2017   "had to have blood transfusion"   Macular degeneration, bilateral    Osteopenia    Seasonal allergies    Skin cancer, basal cell 2001   "off my nose, left side"   Sleeping excessive    Tinnitus of both ears    Past Surgical History:  Procedure Laterality Date   BALLOON DILATION N/A 05/08/2021   Procedure: BALLOON DILATION;  Surgeon: Genell Ken, MD;  Location: [facility] ENDOSCOPY;  Service:  Gastroenterology;  Laterality: N/A;   BASAL CELL CARCINOMA EXCISION  2001   "off my nose, left side"   BIOPSY  05/08/2021   Procedure: BIOPSY;  Surgeon: Genell Ken, MD;  Location: [facility] ENDOSCOPY;  Service: Gastroenterology;;   BIOPSY  04/25/2023   Procedure: BIOPSY;  Surgeon: Baldo Bonds, MD;   Location: [facility] ENDOSCOPY;  Service: Gastroenterology;;   BLEPHAROPLASTY Bilateral    CATARACT EXTRACTION W/ INTRAOCULAR LENS  IMPLANT, BILATERAL Bilateral 1990's   COLONOSCOPY WITH PROPOFOL  N/A 05/04/2022   Procedure: COLONOSCOPY WITH PROPOFOL ;  Surgeon: Genell Ken, MD;  Location: [facility] ENDOSCOPY;  Service: Gastroenterology;  Laterality: N/A;   COLONOSCOPY WITH PROPOFOL  N/A 04/25/2023   Procedure: COLONOSCOPY WITH PROPOFOL ;  Surgeon: Baldo Bonds, MD;  Location: [facility] ENDOSCOPY;  Service: Gastroenterology;  Laterality: N/A;   ESOPHAGOGASTRODUODENOSCOPY (EGD) WITH PROPOFOL  N/A 05/08/2021   Procedure: ESOPHAGOGASTRODUODENOSCOPY (EGD) WITH PROPOFOL ;  Surgeon: Genell Ken, MD;  Location: [facility] ENDOSCOPY;  Service: Gastroenterology;  Laterality: N/A;   ESOPHAGOGASTRODUODENOSCOPY (EGD) WITH PROPOFOL  N/A 01/11/2022   Procedure: ESOPHAGOGASTRODUODENOSCOPY (EGD) WITH PROPOFOL ;  Surgeon: Ozell Blunt, MD;  Location: [facility] ENDOSCOPY;  Service: Gastroenterology;  Laterality: N/A;   ESOPHAGOGASTRODUODENOSCOPY (EGD) WITH PROPOFOL  N/A 01/06/2023   Procedure: ESOPHAGOGASTRODUODENOSCOPY (EGD) WITH PROPOFOL ;  Surgeon: Ozell Blunt, MD;  Location: [facility] ENDOSCOPY;  Service: Gastroenterology;  Laterality: N/A;   ESOPHAGOGASTRODUODENOSCOPY (EGD) WITH PROPOFOL  N/A 04/21/2023   Procedure: ESOPHAGOGASTRODUODENOSCOPY (EGD) WITH PROPOFOL ;  Surgeon: Baldo Bonds, MD;  Location: [facility] ENDOSCOPY;  Service: Gastroenterology;  Laterality: N/A;   EYE SURGERY Bilateral    "to improve vision after cataract OR"   GIVENS CAPSULE STUDY N/A 04/21/2023   Procedure: GIVENS CAPSULE STUDY;  Surgeon: Baldo Bonds, MD;  Location: [facility] ENDOSCOPY;  Service: Gastroenterology;  Laterality: N/A;   IR ANGIOGRAM FOLLOW UP STUDY  12/19/2020   IR ANGIOGRAM SELECTIVE EACH ADDITIONAL VESSEL  12/19/2020   IR ANGIOGRAM SELECTIVE EACH ADDITIONAL VESSEL  12/19/2020   IR ANGIOGRAM VISCERAL SELECTIVE  12/19/2020   IR EMBO ART  VEN HEMORR LYMPH EXTRAV  INC GUIDE  ROADMAPPING  12/19/2020   IR US  GUIDE VASC ACCESS RIGHT  12/19/2020   JOINT REPLACEMENT     POLYPECTOMY  05/04/2022   Procedure: POLYPECTOMY;  Surgeon: Genell Ken, MD;  Location: [facility] ENDOSCOPY;  Service: Gastroenterology;;   STAPEDES SURGERY Left    "scraped stapedes because it was sticking when it wasn't suppose to"   TONSILLECTOMY AND ADENOIDECTOMY  1946   TOTAL KNEE ARTHROPLASTY Left ~ 2008   TOTAL KNEE ARTHROPLASTY  12/20/2011   Procedure: TOTAL KNEE ARTHROPLASTY;  Surgeon: Lorriane Rote, MD;  Location: [facility] OR;  Service: Orthopedics;  Laterality: Right;  Right Total Knee Arthroplasty   TUBAL LIGATION  1980's    Allergies  Allergen Reactions   Morphine And Codeine Itching   Aspirin Other (See Comments)    Unknown reaction   Duloxetine  Hcl Other (See Comments)    Unknown reaction   Tymlos [Abaloparatide] Other (See Comments)    Unknown reaction   Ventolin  [Albuterol ] Other (See Comments)    rapid heart beat   Cefadroxil Hives    Patient can take amoxicillin  and cipro     Outpatient Encounter Medications as of 10/24/2023  Medication Sig   acetaminophen  (TYLENOL ) 500 MG tablet Take 1,000 mg by mouth in the morning and at bedtime.   acetaminophen  (TYLENOL ) 650 MG CR tablet Take 1,300 mg by mouth at bedtime.   albuterol  (VENTOLIN  HFA) 108 (90 Base) MCG/ACT inhaler Inhale 2 puffs into the lungs every 4 (four)  hours as needed for wheezing or shortness of breath.   alendronate (FOSAMAX) 70 MG tablet Take 70 mg by mouth every Sunday. Take with a full glass of water on an empty stomach.   Ascorbic Acid  (VITAMIN C ) 1000 MG tablet Take 1,000 mg by mouth every morning.   cetirizine  (ZYRTEC ) 5 MG chewable tablet Chew 5 mg by mouth daily as needed for allergies.   Cholecalciferol  (VITAMIN D3) 50 MCG (2000 UT) TABS Take 2,000 Units by mouth every morning.   cyanocobalamin  (VITAMIN B12) 1000 MCG tablet Take 1,000 mcg by mouth daily.   docusate sodium  (COLACE) 100 MG capsule Take 200 mg by mouth  at bedtime.   Dorzolamide  HCl-Timolol  Mal PF 2-0.5 % SOLN Place 1 drop into both eyes in the morning and at bedtime.   ferrous gluconate  (FERGON) 240 (27 FE) MG tablet Take 240 mg by mouth every morning. Take without food   fluticasone  (FLONASE ) 50 MCG/ACT nasal spray Place 1 spray into both nostrils daily as needed for allergies or rhinitis.   guaiFENesin  (ROBITUSSIN) 100 MG/5ML liquid Take 10 mLs by mouth every 4 (four) hours as needed for cough or to loosen phlegm.   latanoprost  (XALATAN ) 0.005 % ophthalmic solution Place 1 drop into both eyes at bedtime.   Multiple Vitamins-Minerals (PRESERVISION AREDS 2) CAPS Take 1 capsule by mouth 2 (two) times daily.   Netarsudil  Dimesylate (RHOPRESSA ) 0.02 % SOLN Place 1 drop into both eyes at bedtime.   oxybutynin  (DITROPAN -XL) 5 MG 24 hr tablet Take 5 mg by mouth at bedtime.   pantoprazole  (PROTONIX ) 40 MG tablet Take 40 mg by mouth 2 (two) times daily.   polyethylene glycol (MIRALAX  / GLYCOLAX ) 17 g packet Take 8.5 g by mouth daily as needed (constipation).   potassium chloride  SA (KLOR-CON  M) 20 MEQ tablet Take 20 mEq by mouth once.   Probiotic Product (ALIGN) 4 MG CAPS Take 4 mg by mouth at bedtime.   rOPINIRole  (REQUIP ) 0.5 MG tablet Take 1 tablet (0.5 mg total) by mouth daily as needed.   sertraline  (ZOLOFT ) 50 MG tablet Take 1.5 tablets (75 mg total) by mouth daily.   simvastatin  (ZOCOR ) 20 MG tablet Take 20 mg by mouth every evening.   Sodium Fluoride  (PREVIDENT 5000 BOOSTER PLUS) 1.1 % PSTE Place 1 Application onto teeth See admin instructions. Brush on teeth with a toothbrush after evening mouth care. Spit out excess and do not rinse.   torsemide (DEMADEX) 20 MG tablet Take 20 mg by mouth daily.   umeclidinium-vilanterol (ANORO ELLIPTA ) 62.5-25 MCG/ACT AEPB Inhale 1 puff into the lungs every morning.   zinc oxide 20 % ointment Apply 1 Application topically See admin instructions. Apply topically to buttocks after every incontinent episode and  as needed for redness   No facility-administered encounter medications on file as of 10/24/2023.    Review of Systems  Constitutional:  Negative for fatigue and fever.  HENT:  Negative for sore throat and trouble swallowing.   Eyes:  Positive for visual disturbance.  Respiratory:  Negative for cough and shortness of breath.   Cardiovascular:  Positive for leg swelling. Negative for chest pain.  Gastrointestinal:  Negative for abdominal distention, abdominal pain, blood in stool and rectal pain.  Genitourinary:  Negative for dysuria and hematuria.  Musculoskeletal:  Positive for arthralgias and gait problem.  Skin:  Negative for wound.  Neurological:  Positive for weakness. Negative for dizziness and light-headedness.  Psychiatric/Behavioral:  Positive for dysphoric mood. Negative for confusion and sleep disturbance.  The patient is not nervous/anxious.     Immunization History  Administered Date(s) Administered   Fluad Quad(high Dose 65+) 04/24/2022   Fluad Trivalent(High Dose 65+) 04/27/2023   Influenza Inj Mdck Quad Pf 04/12/2017   Influenza, High Dose Seasonal PF 03/14/2018   Influenza, Quadrivalent, Recombinant, Inj, Pf 03/05/2019, 05/22/2021   PFIZER(Purple Top)SARS-COV-2 Vaccination 07/25/2019, 08/16/2019, 12/12/2021   Pneumococcal-Unspecified 03/02/2011   Tdap 03/12/2018   Zoster Recombinant(Shingrix) 10/08/2017, 12/09/2017   Pertinent  Health Maintenance Due  Topic Date Due   DEXA SCAN  Never done   INFLUENZA VACCINE  01/30/2024      07/12/2022   11:08 AM 10/30/2022   12:44 PM 01/22/2023    1:15 PM 04/04/2023   11:00 AM 07/10/2023    9:02 AM  Fall Risk  Falls in the past year? 0 0 0 0 0  Was there an injury with Fall? 0 0 0 0 0  Fall Risk Category Calculator 0 0 0 0 0  Fall Risk Category (Retired) Low      (RETIRED) Patient Fall Risk Level Low fall risk      Patient at Risk for Falls Due to History of fall(s) History of fall(s);Impaired balance/gait;Impaired mobility  History of fall(s);Impaired balance/gait History of fall(s);Impaired balance/gait;Impaired mobility History of fall(s);Impaired balance/gait;Impaired mobility  Fall risk Follow up Falls evaluation completed Falls evaluation completed;Education provided;Falls prevention discussed Falls evaluation completed;Education provided;Falls prevention discussed Falls evaluation completed;Education provided;Falls prevention discussed Falls evaluation completed;Education provided;Falls prevention discussed   Functional Status Survey:    Vitals:   10/24/23 1008  BP: 102/62  Pulse: 62  Resp: 18  Temp: 97.6 F (36.4 C)  SpO2: 97%  Weight: 175 lb 8 oz (79.6 kg)  Height: 5\' 1"  (1.549 m)   Body mass index is 33.16 kg/m. Physical Exam Vitals reviewed.  Constitutional:      General: She is not in acute distress. HENT:     Head: Normocephalic.  Eyes:     General:        Right eye: No discharge.        Left eye: No discharge.  Cardiovascular:     Rate and Rhythm: Normal rate and regular rhythm.     Pulses: Normal pulses.     Heart sounds: Normal heart sounds.  Pulmonary:     Effort: Pulmonary effort is normal.     Breath sounds: Normal breath sounds.  Abdominal:     General: Bowel sounds are normal.     Palpations: Abdomen is soft.  Musculoskeletal:     Cervical back: Neck supple.     Right lower leg: Edema present.     Left lower leg: Edema present.     Comments: Non pitting, ted hose on  Skin:    General: Skin is warm.     Capillary Refill: Capillary refill takes less than 2 seconds.  Neurological:     General: No focal deficit present.     Mental Status: She is alert. Mental status is at baseline.     Motor: Weakness present.     Gait: Gait abnormal.  Psychiatric:        Mood and Affect: Mood normal.     Labs reviewed: Recent Labs    04/25/23 0322 04/27/23 0629 04/29/23 0541 05/09/23 0000  NA 142 140 141 143  K 3.6 3.8 3.7 4.2  CL 112* 106 106 107  CO2 24 27 26  26*   GLUCOSE 108* 120* 108*  --   BUN 6* 10 8  11  CREATININE 0.44 0.48 0.52 0.6  CALCIUM  8.2* 8.6* 8.7* 9.1   Recent Labs    03/29/23 0843 04/03/23 0000 04/19/23 1144 04/20/23 0739  AST 16 14 16 16   ALT 10 9 9 9   ALKPHOS 40 48 31* 27*  BILITOT 0.6  --  0.6 1.1  PROT 6.7  --  5.8* 5.0*  ALBUMIN 3.8 4.2 3.4* 3.0*   Recent Labs    04/19/23 1144 04/19/23 1227 04/26/23 1025 04/27/23 0629 04/29/23 0630 07/14/23 0000  WBC 7.9   < > 5.7 5.5 5.2 4.6  NEUTROABS 5.3  --  3.7  --   --  2,640.00  HGB 5.9*   < > 8.8* 8.4* 8.0* 11.7*  HCT 19.2*   < > 28.8* 27.2* 25.6* 37  MCV 103.8*   < > 99.3 98.6 97.3  --   PLT 154   < > 196 197 189 156   < > = values in this interval not displayed.   Lab Results  Component Value Date   TSH 2.47 10/08/2022   No results found for: "HGBA1C" Lab Results  Component Value Date   CHOL 125 07/14/2023   HDL 51 07/14/2023   LDLCALC 53 07/14/2023   TRIG 130 07/14/2023    Significant Diagnostic Results in last 30 days:  No results found.  Assessment/Plan 1. Localized swelling of both lower legs (Primary) - onset 08/2023 - R>L - doppler negative, xrays right ankle negative for fracture - BNP 104 08/21/2023 - non pitting today - cont ted hose - cont torsemide  2. History of GI diverticular bleed - no recent episodes - hgb 11.6 - cont Protonix   3. Blood loss anemia - see above - cont ferrous gluconate   4. OAB (overactive bladder) - stable with oxybutynin   5. Simple chronic bronchitis (HCC) - no exacerbations - on anoro ellipta  - stopped oxygen  qhs  6. Mixed hyperlipidemia - cont simvastatin   7. Recurrent depression (HCC) - associated with declining vision  - Na+ stable - cont Zoloft   8. Primary osteoarthritis involving multiple joints - recently seen ortho - 04/01 steroid injection to right shoulder and ankle> minimal relfief - cont scheduled tylenol     Family/ staff Communication: plan discussed with patient and  nurse  Labs/tests ordered:  none

## 2023-10-24 NOTE — ED Provider Notes (Signed)
 [place] EMERGENCY DEPARTMENT AT [facility] Provider Note  CSN: [id number] Arrival date & time: 10/24/23 2054  Chief Complaint(s) GI Bleeding  HPI [name] is a 83 y.o. female who is here today for melena.  Patient reports 3 episodes of dark stool.  She has a history of prior GI bleeds, does not take blood thinners.  She denies any abdominal pain.   Past Medical History Past Medical History:  Diagnosis Date   Anxiety    Arthritis    "bilateral knees, shoulders, elbows; neck, pretty widespread" (09/05/2017)   BPPV (benign paroxysmal positional vertigo)    Depression    GERD (gastroesophageal reflux disease)    Glaucoma, both eyes    Headache    "probably 2/month" (09/05/2017)   History of blood transfusion ~ 2008   "related to LGIB"   Hyperlipemia    Lower GI bleeding ~ 2008; 09/05/2017   "had to have blood transfusion"   Macular degeneration, bilateral    Osteopenia    Seasonal allergies    Skin cancer, basal cell 2001   "off my nose, left side"   Sleeping excessive    Tinnitus of both ears    Patient Active Problem List   Diagnosis Date Noted   UGI bleed 04/19/2023   Recurrent depression (HCC) 04/13/2023   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   History of GI diverticular bleed 03/28/2023   GAD (generalized anxiety disorder) 03/28/2023   Diastolic heart failure (HCC) 03/19/2023   Class 1 obesity with body mass index (BMI) of 34.0 to 34.9 in adult 03/19/2023   Thoracic aorta atherosclerosis (HCC) 03/19/2023   Melena 01/05/2023   Hypotension 05/02/2022   UTI (urinary tract infection) 02/05/2022   Restless leg syndrome 02/05/2022   Senile osteoporosis 02/05/2022   Hypertension 02/05/2022   Pulmonary nodule 1 cm or greater in diameter 02/05/2022   GERD (gastroesophageal reflux disease) 02/05/2022   Upper GI bleed 01/07/2022   Iron  deficiency anemia 05/02/2021   Chronic anemia    DOE (dyspnea on exertion) 11/18/2017   COPD (chronic  obstructive pulmonary disease) (HCC) 11/18/2017   Diverticulosis of intestine with bleeding 09/05/2017   Acute respiratory failure with hypoxia (HCC) 08/07/2017   Depression with anxiety 08/07/2017   Hyperlipidemia 08/07/2017   Macular degeneration 08/07/2017   Glaucoma 08/07/2017   Weakness 08/07/2017   Dehydration 08/07/2017   Lower GI bleeding 10/14/2016   Diverticulosis of colon 10/14/2016   Anxiety 10/14/2016   Lower GI bleed 10/14/2016   GI bleed    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 Authorizing Provider  acetaminophen  (TYLENOL ) 500 MG tablet Take 1,000 mg by mouth in the morning and at bedtime.    [provider]  acetaminophen  (TYLENOL ) 650 MG CR tablet Take 1,300 mg by mouth at bedtime.    [provider]  albuterol  (VENTOLIN  HFA) 108 (90 Base) MCG/ACT inhaler Inhale 2 puffs into the lungs every 4 (four) hours as needed for wheezing or shortness of breath.    [provider]  alendronate (FOSAMAX) 70 MG tablet Take 70 mg by mouth every Sunday. Take with a full glass of water on an empty stomach.    [provider]  Ascorbic Acid  (VITAMIN C ) 1000 MG tablet Take 1,000 mg by mouth every morning.  [provider]  cetirizine  (ZYRTEC ) 5 MG chewable tablet Chew 5 mg by mouth daily as needed for allergies.    [provider]  Cholecalciferol  (VITAMIN D3) 50 MCG (2000 UT) TABS Take 2,000 Units by mouth every morning.    [provider]  cyanocobalamin  (VITAMIN B12) 1000 MCG tablet Take 1,000 mcg by mouth daily.    [provider]  docusate sodium  (COLACE) 100 MG capsule Take 200 mg by mouth at bedtime.    [provider]  Dorzolamide  HCl-Timolol  Mal PF 2-0.5 % SOLN Place 1 drop into both eyes in the  morning and at bedtime.    [provider]  ferrous gluconate  (FERGON) 240 (27 FE) MG tablet Take 240 mg by mouth every morning. Take without food    [provider]  fluticasone  (FLONASE ) 50 MCG/ACT nasal spray Place 1 spray into both nostrils daily as needed for allergies or rhinitis.    [provider]  guaiFENesin  (ROBITUSSIN) 100 MG/5ML liquid Take 10 mLs by mouth every 4 (four) hours as needed for cough or to loosen phlegm. 04/28/23   Rai, Ripudeep K, MD  latanoprost  (XALATAN ) 0.005 % ophthalmic solution Place 1 drop into both eyes at bedtime.    [provider]  Multiple Vitamins-Minerals (PRESERVISION AREDS 2) CAPS Take 1 capsule by mouth 2 (two) times daily.    [provider]  Netarsudil  Dimesylate (RHOPRESSA ) 0.02 % SOLN Place 1 drop into both eyes at bedtime.    [provider]  oxybutynin  (DITROPAN -XL) 5 MG 24 hr tablet Take 5 mg by mouth at bedtime. 11/22/22   [provider]  pantoprazole  (PROTONIX ) 40 MG tablet Take 40 mg by mouth 2 (two) times daily.    [provider]  polyethylene glycol (MIRALAX  / GLYCOLAX ) 17 g packet Take 8.5 g by mouth daily as needed (constipation).    [provider]  potassium chloride  SA (KLOR-CON  M) 20 MEQ tablet Take 20 mEq by mouth once.    [provider]  Probiotic Product (ALIGN) 4 MG CAPS Take 4 mg by mouth at bedtime.    [provider]  rOPINIRole  (REQUIP ) 0.5 MG tablet Take 1 tablet (0.5 mg total) by mouth daily as needed. 04/30/23   [name], [name], NP  sertraline  (ZOLOFT ) 50 MG tablet Take 1.5 tablets (75 mg total) by mouth daily. 01/07/23   Samtani, Jai-Gurmukh, MD  simvastatin  (ZOCOR ) 20 MG tablet Take 20 mg by mouth every evening.    [provider]  Sodium Fluoride  (PREVIDENT 5000 BOOSTER PLUS) 1.1 % PSTE Place 1 Application onto teeth See admin instructions. Brush on teeth with a toothbrush after evening mouth care. Spit out excess and do  not rinse.    [provider]  torsemide (DEMADEX) 20 MG tablet Take 20 mg by mouth daily.    [provider]  umeclidinium-vilanterol (ANORO ELLIPTA ) 62.5-25 MCG/ACT AEPB Inhale 1 puff into the lungs every morning.    [provider]  zinc oxide 20 % ointment Apply 1 Application topically See admin instructions. Apply topically to buttocks after every incontinent episode and as needed for redness    [provider]  Past Surgical History Past Surgical History:  Procedure Laterality Date   BALLOON DILATION N/A 05/08/2021   Procedure: BALLOON DILATION;  Surgeon: Genell Ken, MD;  Location: [facility] ENDOSCOPY;  Service: Gastroenterology;  Laterality: N/A;   BASAL CELL CARCINOMA EXCISION  2001   "off my nose, left side"   BIOPSY  05/08/2021   Procedure: BIOPSY;  Surgeon: Genell Ken, MD;  Location: [facility] ENDOSCOPY;  Service: Gastroenterology;;   BIOPSY  04/25/2023   Procedure: BIOPSY;  Surgeon: Baldo Bonds, MD;  Location: [facility] ENDOSCOPY;  Service: Gastroenterology;;   BLEPHAROPLASTY Bilateral    CATARACT EXTRACTION W/ INTRAOCULAR LENS  IMPLANT, BILATERAL Bilateral 1990's   COLONOSCOPY WITH PROPOFOL  N/A 05/04/2022   Procedure: COLONOSCOPY WITH PROPOFOL ;  Surgeon: Genell Ken, MD;  Location: [facility] ENDOSCOPY;  Service: Gastroenterology;  Laterality: N/A;   COLONOSCOPY WITH PROPOFOL  N/A 04/25/2023   Procedure: COLONOSCOPY WITH PROPOFOL ;  Surgeon: Baldo Bonds, MD;  Location: [facility] ENDOSCOPY;  Service: Gastroenterology;  Laterality: N/A;   ESOPHAGOGASTRODUODENOSCOPY (EGD) WITH PROPOFOL  N/A 05/08/2021   Procedure: ESOPHAGOGASTRODUODENOSCOPY (EGD) WITH PROPOFOL ;  Surgeon: Genell Ken, MD;  Location: [facility] ENDOSCOPY;  Service: Gastroenterology;  Laterality: N/A;   ESOPHAGOGASTRODUODENOSCOPY (EGD) WITH PROPOFOL  N/A 01/11/2022   Procedure:  ESOPHAGOGASTRODUODENOSCOPY (EGD) WITH PROPOFOL ;  Surgeon: Ozell Blunt, MD;  Location: [facility] ENDOSCOPY;  Service: Gastroenterology;  Laterality: N/A;   ESOPHAGOGASTRODUODENOSCOPY (EGD) WITH PROPOFOL  N/A 01/06/2023   Procedure: ESOPHAGOGASTRODUODENOSCOPY (EGD) WITH PROPOFOL ;  Surgeon: Ozell Blunt, MD;  Location: [facility] ENDOSCOPY;  Service: Gastroenterology;  Laterality: N/A;   ESOPHAGOGASTRODUODENOSCOPY (EGD) WITH PROPOFOL  N/A 04/21/2023   Procedure: ESOPHAGOGASTRODUODENOSCOPY (EGD) WITH PROPOFOL ;  Surgeon: Baldo Bonds, MD;  Location: [facility] ENDOSCOPY;  Service: Gastroenterology;  Laterality: N/A;   EYE SURGERY Bilateral    "to improve vision after cataract OR"   GIVENS CAPSULE STUDY N/A 04/21/2023   Procedure: GIVENS CAPSULE STUDY;  Surgeon: Baldo Bonds, MD;  Location: [facility] ENDOSCOPY;  Service: Gastroenterology;  Laterality: N/A;   IR ANGIOGRAM FOLLOW UP STUDY  12/19/2020   IR ANGIOGRAM SELECTIVE EACH ADDITIONAL VESSEL  12/19/2020   IR ANGIOGRAM SELECTIVE EACH ADDITIONAL VESSEL  12/19/2020   IR ANGIOGRAM VISCERAL SELECTIVE  12/19/2020   IR EMBO ART  VEN HEMORR LYMPH EXTRAV  INC GUIDE ROADMAPPING  12/19/2020   IR US  GUIDE VASC ACCESS RIGHT  12/19/2020   JOINT REPLACEMENT     POLYPECTOMY  05/04/2022   Procedure: POLYPECTOMY;  Surgeon: Genell Ken, MD;  Location: [facility] ENDOSCOPY;  Service: Gastroenterology;;   STAPEDES SURGERY Left    "scraped stapedes because it was sticking when it wasn't suppose to"   TONSILLECTOMY AND ADENOIDECTOMY  1946   TOTAL KNEE ARTHROPLASTY Left ~ 2008   TOTAL KNEE ARTHROPLASTY  12/20/2011   Procedure: TOTAL KNEE ARTHROPLASTY;  Surgeon: Lorriane Rote, MD;  Location: [facility] OR;  Service: Orthopedics;  Laterality: Right;  Right Total Knee Arthroplasty   TUBAL LIGATION  1980's   Family History Family History  Problem Relation Age of Onset   Heart attack Mother    Heart disease Father    Heart failure Father    Bladder Cancer Father    Supraventricular tachycardia Sister     Hypertension Sister     Social History Social History   Tobacco Use   Smoking status: Former    Current packs/day: 0.00    Average packs/day: 0.8 packs/day for 37.0 years (27.8 ttl pk-yrs)    Types: Cigarettes    Start date: 07/02/1955    Quit date: 07/01/1992    Years since quitting: 31.3  Smokeless tobacco: Never  Vaping Use   Vaping status: Never Used  Substance Use Topics   Alcohol  use: Yes    Alcohol /week: 7.0 standard drinks of alcohol     Types: 7 Glasses of wine per week    Comment: 09/05/2017  "6 oz wine, 5 days/wk"   Drug use: No    Types: Hydrocodone    Allergies Morphine and codeine, Aspirin, Duloxetine  hcl, Tymlos [abaloparatide], Ventolin  [albuterol ], and Cefadroxil  Review of Systems Review of Systems  Physical Exam Vital Signs  I have reviewed the triage vital signs BP 135/72   Pulse 95   Temp 97.9 F (36.6 C) (Oral)   Resp 16   SpO2 95%   Physical Exam Vitals reviewed.  HENT:     Head: Normocephalic.  Cardiovascular:     Rate and Rhythm: Normal rate.     Pulses: Normal pulses.  Pulmonary:     Effort: Pulmonary effort is normal.  Abdominal:     General: Abdomen is flat. There is no distension.     Palpations: Abdomen is soft.     Tenderness: There is no abdominal tenderness. There is no guarding.  Genitourinary:    Comments: Melena on digital rectal exam Neurological:     Mental Status: She is alert.     ED Results and Treatments Labs (all labs ordered are listed, but only abnormal results are displayed) Labs Reviewed  COMPREHENSIVE METABOLIC PANEL WITH GFR - Abnormal; Notable for the following components:      Result Value   Glucose, Bld 165 (*)    All other components within normal limits  CBC - Abnormal; Notable for the following components:   RBC 3.61 (*)    Hemoglobin 11.4 (*)    MCV 101.9 (*)    RDW 15.9 (*)    All other components within normal limits  TYPE AND SCREEN                                                                                                                           Radiology No results found.  Pertinent labs & imaging results that were available during my care of the patient were reviewed by me and considered in my medical decision making (see MDM for details).  Medications Ordered in ED Medications  pantoprazole  (PROTONIX ) injection 40 mg (40 mg Intravenous Given 10/24/23 2130)  Procedures Procedures  (including critical care time)  Medical Decision Making / ED Course   This patient presents to the ED for concern of blood in stool, this involves an extensive number of treatment options, and is a complaint that carries with it a high risk of complications and morbidity.  The differential diagnosis includes melena, GI bleed, upper GI bleed, less likely lower GI bleed.  MDM: On exam, patient does not fact have melena.  She is not on blood thinners.  She is hemodynamically stable at this time.  Does have a heart rate of 95.  Will plan to admit patient to hospitalist due to melena.  Have given Protonix .  Will consult GI to have the patient put on the list.  No indication for CT imaging given patient's vital signs and exam.  Reviewed the patient's most recent hospital discharge summary from October of this past year.  At that time, gastritis causing patient's GI bleed.  Reassessment 11 PM-patient's hemoglobin 11.4.  Remains hemodynamically stable.  Spoke with gastroenterology, Dr. Lavaughn Portland. Agrees with admission.  Will admit to hospitalist.  Additional history obtained: -Additional history obtained from EMS -External records from outside source obtained and reviewed including: Chart review including previous notes, labs, imaging, consultation notes   Lab Tests: -I ordered, reviewed, and interpreted labs.   The pertinent results include:   Labs Reviewed   COMPREHENSIVE METABOLIC PANEL WITH GFR - Abnormal; Notable for the following components:      Result Value   Glucose, Bld 165 (*)    All other components within normal limits  CBC - Abnormal; Notable for the following components:   RBC 3.61 (*)    Hemoglobin 11.4 (*)    MCV 101.9 (*)    RDW 15.9 (*)    All other components within normal limits  TYPE AND SCREEN      EKG   EKG Interpretation Date/Time:    Ventricular Rate:    PR Interval:    QRS Duration:    QT Interval:    QTC Calculation:   R Axis:      Text Interpretation:           Imaging Studies ordered: I ordered imaging studies including  I independently visualized and interpreted imaging. I agree with the radiologist interpretation   Medicines ordered and prescription drug management: Meds ordered this encounter  Medications   pantoprazole  (PROTONIX ) injection 40 mg    -I have reviewed the patients home medicines and have made adjustments as needed   Cardiac Monitoring: The patient was maintained on a cardiac monitor.  I personally viewed and interpreted the cardiac monitored which showed an underlying rhythm of: Normal sinus rhythm  Social Determinants of Health:  Factors impacting patients care include: Advanced age, history of GI bleed   Reevaluation: After the interventions noted above, I reevaluated the patient and found that they have :improved  Co morbidities that complicate the patient evaluation  Past Medical History:  Diagnosis Date   Anxiety    Arthritis    "bilateral knees, shoulders, elbows; neck, pretty widespread" (09/05/2017)   BPPV (benign paroxysmal positional vertigo)    Depression    GERD (gastroesophageal reflux disease)    Glaucoma, both eyes    Headache    "probably 2/month" (09/05/2017)   History of blood transfusion ~ 2008   "related to LGIB"   Hyperlipemia    Lower GI bleeding ~ 2008; 09/05/2017   "had to have blood transfusion"   Macular degeneration, bilateral  Osteopenia    Seasonal allergies    Skin cancer, basal cell 2001   "off my nose, left side"   Sleeping excessive    Tinnitus of both ears       Dispostion: Will admit patient to hospitalist     Final Clinical Impression(s) / ED Diagnoses Final diagnoses:  None     @PCDICTATION @    Afton Horse T, DO 10/24/23 2303

## 2023-10-24 NOTE — ED Triage Notes (Signed)
 Pt BIBA from [facility] with c/o GI bleed. 3 episodes of bloody stool today. Hx of GI bleed. Denies taking blood thinners. Denies pain. 94% RA.

## 2023-10-25 ENCOUNTER — Encounter (HOSPITAL_COMMUNITY): Payer: Self-pay | Admitting: Internal Medicine

## 2023-10-25 DIAGNOSIS — K635 Polyp of colon: Secondary | ICD-10-CM | POA: Diagnosis not present

## 2023-10-25 DIAGNOSIS — D122 Benign neoplasm of ascending colon: Secondary | ICD-10-CM | POA: Diagnosis not present

## 2023-10-25 DIAGNOSIS — K317 Polyp of stomach and duodenum: Secondary | ICD-10-CM | POA: Diagnosis not present

## 2023-10-25 DIAGNOSIS — R600 Localized edema: Secondary | ICD-10-CM

## 2023-10-25 DIAGNOSIS — K2970 Gastritis, unspecified, without bleeding: Secondary | ICD-10-CM | POA: Diagnosis not present

## 2023-10-25 DIAGNOSIS — K625 Hemorrhage of anus and rectum: Secondary | ICD-10-CM | POA: Diagnosis not present

## 2023-10-25 DIAGNOSIS — D123 Benign neoplasm of transverse colon: Secondary | ICD-10-CM | POA: Diagnosis not present

## 2023-10-25 DIAGNOSIS — K559 Vascular disorder of intestine, unspecified: Secondary | ICD-10-CM | POA: Diagnosis not present

## 2023-10-25 DIAGNOSIS — I5032 Chronic diastolic (congestive) heart failure: Secondary | ICD-10-CM

## 2023-10-25 DIAGNOSIS — K922 Gastrointestinal hemorrhage, unspecified: Secondary | ICD-10-CM | POA: Diagnosis not present

## 2023-10-25 DIAGNOSIS — K648 Other hemorrhoids: Secondary | ICD-10-CM | POA: Diagnosis not present

## 2023-10-25 DIAGNOSIS — D500 Iron deficiency anemia secondary to blood loss (chronic): Secondary | ICD-10-CM | POA: Diagnosis not present

## 2023-10-25 DIAGNOSIS — K5730 Diverticulosis of large intestine without perforation or abscess without bleeding: Secondary | ICD-10-CM | POA: Diagnosis not present

## 2023-10-25 DIAGNOSIS — K921 Melena: Secondary | ICD-10-CM | POA: Diagnosis not present

## 2023-10-25 DIAGNOSIS — H409 Unspecified glaucoma: Secondary | ICD-10-CM

## 2023-10-25 LAB — CBC
HCT: 31.9 % — ABNORMAL LOW (ref 36.0–46.0)
Hemoglobin: 9.7 g/dL — ABNORMAL LOW (ref 12.0–15.0)
MCH: 31.1 pg (ref 26.0–34.0)
MCHC: 30.4 g/dL (ref 30.0–36.0)
MCV: 102.2 fL — ABNORMAL HIGH (ref 80.0–100.0)
Platelets: 153 10*3/uL (ref 150–400)
RBC: 3.12 MIL/uL — ABNORMAL LOW (ref 3.87–5.11)
RDW: 16.3 % — ABNORMAL HIGH (ref 11.5–15.5)
WBC: 7.1 10*3/uL (ref 4.0–10.5)
nRBC: 0 % (ref 0.0–0.2)

## 2023-10-25 LAB — D-DIMER, QUANTITATIVE: D-Dimer, Quant: 0.32 ug{FEU}/mL (ref 0.00–0.50)

## 2023-10-25 LAB — TYPE AND SCREEN
ABO/RH(D): O POS
Antibody Screen: POSITIVE
PT AG Type: NEGATIVE

## 2023-10-25 LAB — BRAIN NATRIURETIC PEPTIDE: B Natriuretic Peptide: 46.6 pg/mL (ref 0.0–100.0)

## 2023-10-25 LAB — HEMOGLOBIN AND HEMATOCRIT, BLOOD
HCT: 32.2 % — ABNORMAL LOW (ref 36.0–46.0)
Hemoglobin: 9.9 g/dL — ABNORMAL LOW (ref 12.0–15.0)

## 2023-10-25 LAB — GLUCOSE, CAPILLARY: Glucose-Capillary: 118 mg/dL — ABNORMAL HIGH (ref 70–99)

## 2023-10-25 MED ORDER — LATANOPROST 0.005 % OP SOLN
1.0000 [drp] | Freq: Every day | OPHTHALMIC | Status: DC
  Administered 2023-10-25 – 2023-10-26 (×2): 1 [drp] via OPHTHALMIC
  Filled 2023-10-25: qty 2.5

## 2023-10-25 MED ORDER — TIMOLOL MALEATE 0.5 % OP SOLN
1.0000 [drp] | Freq: Two times a day (BID) | OPHTHALMIC | Status: DC
  Administered 2023-10-25 – 2023-10-27 (×4): 1 [drp] via OPHTHALMIC
  Filled 2023-10-25: qty 5

## 2023-10-25 MED ORDER — TORSEMIDE 20 MG PO TABS
20.0000 mg | ORAL_TABLET | Freq: Every day | ORAL | Status: DC
  Administered 2023-10-25 – 2023-10-27 (×2): 20 mg via ORAL
  Filled 2023-10-25 (×3): qty 1

## 2023-10-25 MED ORDER — UMECLIDINIUM-VILANTEROL 62.5-25 MCG/ACT IN AEPB
1.0000 | INHALATION_SPRAY | Freq: Every morning | RESPIRATORY_TRACT | Status: DC
  Administered 2023-10-27: 1 via RESPIRATORY_TRACT
  Filled 2023-10-25: qty 14

## 2023-10-25 MED ORDER — OXYBUTYNIN CHLORIDE ER 5 MG PO TB24
10.0000 mg | ORAL_TABLET | Freq: Every day | ORAL | Status: DC
  Administered 2023-10-25 – 2023-10-26 (×2): 10 mg via ORAL
  Filled 2023-10-25 (×2): qty 2

## 2023-10-25 MED ORDER — DORZOLAMIDE HCL 2 % OP SOLN
1.0000 [drp] | Freq: Two times a day (BID) | OPHTHALMIC | Status: DC
  Administered 2023-10-25 – 2023-10-27 (×4): 1 [drp] via OPHTHALMIC
  Filled 2023-10-25: qty 10

## 2023-10-25 MED ORDER — ALBUTEROL SULFATE (2.5 MG/3ML) 0.083% IN NEBU: 3.0000 mL | INHALATION_SOLUTION | RESPIRATORY_TRACT | Status: DC | PRN

## 2023-10-25 MED ORDER — NETARSUDIL DIMESYLATE 0.02 % OP SOLN: 1.0000 [drp] | Freq: Every day | OPHTHALMIC | Status: DC

## 2023-10-25 MED ORDER — ACETAMINOPHEN 325 MG PO TABS: 650.0000 mg | ORAL_TABLET | Freq: Four times a day (QID) | ORAL | Status: DC | PRN

## 2023-10-25 MED ORDER — SIMETHICONE 80 MG PO CHEW
240.0000 mg | CHEWABLE_TABLET | Freq: Once | ORAL | Status: AC
  Administered 2023-10-25: 240 mg via ORAL
  Filled 2023-10-25: qty 3

## 2023-10-25 MED ORDER — SIMETHICONE 80 MG PO CHEW
240.0000 mg | CHEWABLE_TABLET | Freq: Once | ORAL | Status: AC
  Administered 2023-10-25: 240 mg via ORAL

## 2023-10-25 MED ORDER — SIMETHICONE 80 MG PO CHEW
240.0000 mg | CHEWABLE_TABLET | Freq: Once | ORAL | Status: DC
  Filled 2023-10-25: qty 3

## 2023-10-25 MED ORDER — DORZOLAMIDE HCL-TIMOLOL MAL PF 2-0.5 % OP SOLN: 1.0000 [drp] | Freq: Two times a day (BID) | OPHTHALMIC | Status: DC

## 2023-10-25 MED ORDER — SODIUM CHLORIDE 0.9 % IV SOLN
INTRAVENOUS | Status: DC
Start: 2023-10-25 — End: 2023-10-25

## 2023-10-25 MED ORDER — PANTOPRAZOLE SODIUM 40 MG IV SOLR
40.0000 mg | Freq: Two times a day (BID) | INTRAVENOUS | Status: DC
  Administered 2023-10-25 – 2023-10-27 (×5): 40 mg via INTRAVENOUS
  Filled 2023-10-25 (×5): qty 10

## 2023-10-25 MED ORDER — SERTRALINE HCL 50 MG PO TABS
75.0000 mg | ORAL_TABLET | Freq: Every day | ORAL | Status: DC
  Administered 2023-10-25 – 2023-10-27 (×2): 75 mg via ORAL
  Filled 2023-10-25 (×3): qty 1

## 2023-10-25 MED ORDER — PEG 3350-KCL-NA BICARB-NACL 420 G PO SOLR
4000.0000 mL | Freq: Once | ORAL | Status: AC
  Administered 2023-10-25: 4000 mL via ORAL

## 2023-10-25 MED ORDER — SIMETHICONE 80 MG PO CHEW: 240.0000 mg | CHEWABLE_TABLET | Freq: Once | ORAL | Status: DC

## 2023-10-25 MED ORDER — ACETAMINOPHEN 650 MG RE SUPP: 650.0000 mg | Freq: Four times a day (QID) | RECTAL | Status: DC | PRN

## 2023-10-25 NOTE — Hospital Course (Addendum)
 83 year old woman complex PMH with recurrent GI bleed and multiple investigations in the past, who presented with melena.  Seen by gastroenterology with plan for upper and lower endoscopy 4/27.  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

## 2023-10-25 NOTE — Care Management Obs Status (Signed)
 MEDICARE OBSERVATION STATUS NOTIFICATION   Patient Details  Name: [name] MRN: [id number] Date of Birth: 07-Jan-1941   Medicare Observation Status Notification Given:  Yes    Engelbert Sevin Liane Redman, LCSW 10/25/2023, 9:49 AM

## 2023-10-25 NOTE — Consult Note (Signed)
 Referring Provider: TH Primary Care Physician:  Arnetha Bhat, NP Primary Gastroenterologist:  Dr. Lavaughn Portland  Reason for Consultation: Dark stool, history of GI bleed  HPI: [name] is a 83 y.o. female with past medical history of recurrent GI bleed requiring multiple endoscopies and colonoscopy as mentioned below, multiple negative CT angio in the past.  Nuclear GI bleed scan in November 2023 showed possible bleeding in colon.  She presented to the hospital with 3 episodes of melena. Patient seen and examined at bedside.  According the patient, she cannot see very well so she is not sure how long she has been having dark-colored stool.  She denies any abdominal pain, nausea or vomiting.  EGD in October 2024 by Dr. Honey Lusty showed gastritis multiple gastric polyps.  Capsule endoscopy was deployed.  Colonoscopy in October 2024 showed 20 mm polyp in the proximal ascending colon.  Polyp was semisessile.  Biopsy was performed.  Colonoscopy also showed inflamed and ulcerated mucosa in the proximal colon in the cecum as well as diverticulosis.  Biopsy from the polyp showed sessile serrated polyp.  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  Capsule endoscopy showed no evidence of small bowel bleed.  Showed possible AVM in the proximal colon which could be the inflammation seen during colonoscopy.  Previous GI workup: EGD, Dr. Lavaughn Portland, melena, inpatient, 01/06/2023: Polyps in gastric body, mild inflammation, no bleeding Colonoscopy, Dr. Feliberto Hopping, inpatient, hematochezia: Dark blood found in entire colon, diverticulosis in sigmoid, descending, splenic flexure, transverse, hepatic flexure and ascending colon, 2 sessile serrated adenomas removed EGD, melena, inpatient, Dr. Lavaughn Portland, 01/11/2022: Tiny hiatal hernia, food residue, otherwise unremarkable EGD, Dr. Feliberto Hopping, inpatient, dysphagia, dysmotility, esophageal stricture on barium swallow, 05/08/2021: Candida esophagitis, empiric balloon dilation performed,  gastric polyps, otherwise unremarkable   Past Medical History:  Diagnosis Date   Anxiety    Arthritis    "bilateral knees, shoulders, elbows; neck, pretty widespread" (09/05/2017)   BPPV (benign paroxysmal positional vertigo)    Depression    GERD (gastroesophageal reflux disease)    Glaucoma, both eyes    Headache    "probably 2/month" (09/05/2017)   History of blood transfusion ~ 2008   "related to LGIB"   Hyperlipemia    Lower GI bleeding ~ 2008; 09/05/2017   "had to have blood transfusion"   Macular degeneration, bilateral    Osteopenia    Seasonal allergies    Skin cancer, basal cell 2001   "off my nose, left side"   Sleeping excessive    Tinnitus of both ears     Past Surgical History:  Procedure Laterality Date   BALLOON DILATION N/A 05/08/2021   Procedure: BALLOON DILATION;  Surgeon: Genell Ken, MD;  Location: [facility] ENDOSCOPY;  Service: Gastroenterology;  Laterality: N/A;   BASAL CELL CARCINOMA EXCISION  2001   "off my nose, left side"   BIOPSY  05/08/2021   Procedure: BIOPSY;  Surgeon: Genell Ken, MD;  Location: [facility] ENDOSCOPY;  Service: Gastroenterology;;   BIOPSY  04/25/2023   Procedure: BIOPSY;  Surgeon: Baldo Bonds, MD;  Location: [facility] ENDOSCOPY;  Service: Gastroenterology;;   BLEPHAROPLASTY Bilateral    CATARACT EXTRACTION W/ INTRAOCULAR LENS  IMPLANT, BILATERAL Bilateral 1990's   COLONOSCOPY WITH PROPOFOL  N/A 05/04/2022   Procedure: COLONOSCOPY WITH PROPOFOL ;  Surgeon: Genell Ken, MD;  Location: [facility] ENDOSCOPY;  Service: Gastroenterology;  Laterality: N/A;   COLONOSCOPY WITH PROPOFOL  N/A 04/25/2023   Procedure: COLONOSCOPY WITH PROPOFOL ;  Surgeon: Baldo Bonds, MD;  Location: [facility] ENDOSCOPY;  Service: Gastroenterology;  Laterality: N/A;   ESOPHAGOGASTRODUODENOSCOPY (EGD) WITH PROPOFOL  N/A 05/08/2021   Procedure: ESOPHAGOGASTRODUODENOSCOPY (EGD) WITH PROPOFOL ;  Surgeon: Genell Ken, MD;  Location: [facility] ENDOSCOPY;  Service: Gastroenterology;  Laterality: N/A;    ESOPHAGOGASTRODUODENOSCOPY (EGD) WITH PROPOFOL  N/A 01/11/2022   Procedure: ESOPHAGOGASTRODUODENOSCOPY (EGD) WITH PROPOFOL ;  Surgeon: Ozell Blunt, MD;  Location: [facility] ENDOSCOPY;  Service: Gastroenterology;  Laterality: N/A;   ESOPHAGOGASTRODUODENOSCOPY (EGD) WITH PROPOFOL  N/A 01/06/2023   Procedure: ESOPHAGOGASTRODUODENOSCOPY (EGD) WITH PROPOFOL ;  Surgeon: Ozell Blunt, MD;  Location: [facility] ENDOSCOPY;  Service: Gastroenterology;  Laterality: N/A;   ESOPHAGOGASTRODUODENOSCOPY (EGD) WITH PROPOFOL  N/A 04/21/2023   Procedure: ESOPHAGOGASTRODUODENOSCOPY (EGD) WITH PROPOFOL ;  Surgeon: Baldo Bonds, MD;  Location: [facility] ENDOSCOPY;  Service: Gastroenterology;  Laterality: N/A;   EYE SURGERY Bilateral    "to improve vision after cataract OR"   GIVENS CAPSULE STUDY N/A 04/21/2023   Procedure: GIVENS CAPSULE STUDY;  Surgeon: Baldo Bonds, MD;  Location: [facility] ENDOSCOPY;  Service: Gastroenterology;  Laterality: N/A;   IR ANGIOGRAM FOLLOW UP STUDY  12/19/2020   IR ANGIOGRAM SELECTIVE EACH ADDITIONAL VESSEL  12/19/2020   IR ANGIOGRAM SELECTIVE EACH ADDITIONAL VESSEL  12/19/2020   IR ANGIOGRAM VISCERAL SELECTIVE  12/19/2020   IR EMBO ART  VEN HEMORR LYMPH EXTRAV  INC GUIDE ROADMAPPING  12/19/2020   IR US  GUIDE VASC ACCESS RIGHT  12/19/2020   JOINT REPLACEMENT     POLYPECTOMY  05/04/2022   Procedure: POLYPECTOMY;  Surgeon: Genell Ken, MD;  Location: [facility] ENDOSCOPY;  Service: Gastroenterology;;   STAPEDES SURGERY Left    "scraped stapedes because it was sticking when it wasn't suppose to"   TONSILLECTOMY AND ADENOIDECTOMY  1946   TOTAL KNEE ARTHROPLASTY Left ~ 2008   TOTAL KNEE ARTHROPLASTY  12/20/2011   Procedure: TOTAL KNEE ARTHROPLASTY;  Surgeon: Lorriane Rote, MD;  Location: [facility] OR;  Service: Orthopedics;  Laterality: Right;  Right Total Knee Arthroplasty   TUBAL LIGATION  1980's    Prior to Admission medications   Medication Sig Start Date End Date Taking? Authorizing Provider  acetaminophen  (TYLENOL ) 500 MG  tablet Take 1,000 mg by mouth in the morning and at bedtime.    [provider]  acetaminophen  (TYLENOL ) 650 MG CR tablet Take 1,300 mg by mouth at bedtime.    [provider]  albuterol  (VENTOLIN  HFA) 108 (90 Base) MCG/ACT inhaler Inhale 2 puffs into the lungs every 4 (four) hours as needed for wheezing or shortness of breath.    [provider]  alendronate (FOSAMAX) 70 MG tablet Take 70 mg by mouth every Sunday. Take with a full glass of water on an empty stomach.    [provider]  Ascorbic Acid  (VITAMIN C ) 1000 MG tablet Take 1,000 mg by mouth every morning.    [provider]  cetirizine  (ZYRTEC ) 5 MG chewable tablet Chew 5 mg by mouth daily as needed for allergies.    [provider]  Cholecalciferol  (VITAMIN D3) 50 MCG (2000 UT) TABS Take 2,000 Units by mouth every morning.    [provider]  cyanocobalamin  (VITAMIN B12) 1000 MCG tablet Take 1,000 mcg by mouth daily.    [provider]  docusate sodium  (COLACE) 100 MG capsule Take 200 mg by mouth at bedtime.    [provider]  Dorzolamide  HCl-Timolol  Mal PF 2-0.5 % SOLN Place 1 drop into both eyes in the morning and at bedtime.    [provider]  ferrous gluconate  (FERGON) 240 (27 FE) MG tablet Take 240 mg by mouth every  morning. Take without food    [provider]  fluticasone  (FLONASE ) 50 MCG/ACT nasal spray Place 1 spray into both nostrils daily as needed for allergies or rhinitis.    [provider]  guaiFENesin  (ROBITUSSIN) 100 MG/5ML liquid Take 10 mLs by mouth every 4 (four) hours as needed for cough or to loosen phlegm. 04/28/23   Rai, Hurman Maiden, MD  latanoprost  (XALATAN ) 0.005 % ophthalmic solution Place 1 drop into both eyes at bedtime.    [provider]  Multiple Vitamins-Minerals (PRESERVISION AREDS 2) CAPS Take 1 capsule by mouth 2 (two) times daily.    [provider]  Netarsudil  Dimesylate  (RHOPRESSA ) 0.02 % SOLN Place 1 drop into both eyes at bedtime.    [provider]  oxybutynin  (DITROPAN -XL) 5 MG 24 hr tablet Take 5 mg by mouth at bedtime. 11/22/22   [provider]  pantoprazole  (PROTONIX ) 40 MG tablet Take 40 mg by mouth 2 (two) times daily.    [provider]  polyethylene glycol (MIRALAX  / GLYCOLAX ) 17 g packet Take 8.5 g by mouth daily as needed (constipation).    [provider]  potassium chloride  SA (KLOR-CON  M) 20 MEQ tablet Take 20 mEq by mouth once.    [provider]  Probiotic Product (ALIGN) 4 MG CAPS Take 4 mg by mouth at bedtime.    [provider]  rOPINIRole  (REQUIP ) 0.5 MG tablet Take 1 tablet (0.5 mg total) by mouth daily as needed. 04/30/23   [name], [name], NP  sertraline  (ZOLOFT ) 50 MG tablet Take 1.5 tablets (75 mg total) by mouth daily. 01/07/23   Samtani, Jai-Gurmukh, MD  simvastatin  (ZOCOR ) 20 MG tablet Take 20 mg by mouth every evening.    [provider]  Sodium Fluoride  (PREVIDENT 5000 BOOSTER PLUS) 1.1 % PSTE Place 1 Application onto teeth See admin instructions. Brush on teeth with a toothbrush after evening mouth care. Spit out excess and do not rinse.    [provider]  torsemide (DEMADEX) 20 MG tablet Take 20 mg by mouth daily.    [provider]  umeclidinium-vilanterol (ANORO ELLIPTA ) 62.5-25 MCG/ACT AEPB Inhale 1 puff into the lungs every morning.    [provider]  zinc oxide 20 % ointment Apply 1 Application topically See admin instructions. Apply topically to buttocks after every incontinent episode and as needed for redness    [provider]    Scheduled Meds:  pantoprazole  (PROTONIX ) IV  40 mg Intravenous Q12H   Continuous Infusions:  sodium chloride  125 mL/hr at 10/25/23 0841   PRN Meds:.acetaminophen  **OR** acetaminophen   Allergies as of 10/24/2023 - Review Complete 08/04/2023  Allergen Reaction Noted   Morphine and codeine Itching  11/29/2011   Aspirin Other (See Comments) 10/22/2020   Duloxetine  hcl Other (See Comments) 10/22/2020   Tymlos [abaloparatide] Other (See Comments) 10/22/2020   Ventolin  [albuterol ] Other (See Comments) 12/25/2020   Cefadroxil Hives 12/11/2011    Family History  Problem Relation Age of Onset   Heart attack Mother    Heart disease Father    Heart failure Father    Bladder Cancer Father    Supraventricular tachycardia Sister    Hypertension Sister     Social History   Socioeconomic History   Marital status: Married    Spouse name: Josiah Nigh    Number of children: 3   Years of education: College   Highest education level: Not on file  Occupational History   Occupation: Retired Runner, broadcasting/film/video  Tobacco  Use   Smoking status: Former    Current packs/day: 0.00    Average packs/day: 0.8 packs/day for 37.0 years (27.8 ttl pk-yrs)    Types: Cigarettes    Start date: 07/02/1955    Quit date: 07/01/1992    Years since quitting: 31.3    Passive exposure: Never   Smokeless tobacco: Never  Vaping Use   Vaping status: Never Used  Substance and Sexual Activity   Alcohol  use: Yes    Alcohol /week: 7.0 standard drinks of alcohol     Types: 7 Glasses of wine per week    Comment: 09/05/2017  "6 oz wine, 5 days/wk"   Drug use: No    Types: Hydrocodone    Sexual activity: Not Currently  Other Topics Concern   Not on file  Social History Narrative   1-2 cups of coffee a day    Social Drivers of Health   Financial Resource Strain: Low Risk  (04/04/2023)   Overall Financial Resource Strain (CARDIA)    Difficulty of Paying Living Expenses: Not hard at all  Food Insecurity: No Food Insecurity (10/25/2023)   Hunger Vital Sign    Worried About Running Out of Food in the Last Year: Never true    Ran Out of Food in the Last Year: Never true  Transportation Needs: No Transportation Needs (10/25/2023)   PRAPARE - Administrator, Civil Service (Medical): No    Lack of Transportation (Non-Medical): No   Physical Activity: Insufficiently Active (04/04/2023)   Exercise Vital Sign    Days of Exercise per Week: 2 days    Minutes of Exercise per Session: 20 min  Stress: No Stress Concern Present (04/04/2023)   [employer] of Occupational Health - Occupational Stress Questionnaire    Feeling of Stress : Only a little  Social Connections: Socially Integrated (10/25/2023)   Social Connection and Isolation Panel [NHANES]    Frequency of Communication with Friends and Family: More than three times a week    Frequency of Social Gatherings with Friends and Family: More than three times a week    Attends Religious Services: 1 to 4 times per year    Active Member of [employer] or Organizations: Yes    Attends Banker Meetings: 1 to 4 times per year    Marital Status: Married  Catering manager Violence: Not At Risk (10/25/2023)   Humiliation, Afraid, Rape, and Kick questionnaire    Fear of Current or Ex-Partner: No    Emotionally Abused: No    Physically Abused: No    Sexually Abused: No    Review of Systems: All negative except as stated above in HPI.  Physical Exam: Vital signs: Vitals:   10/25/23 0500 10/25/23 0553  BP: (!) 116/55 130/61  Pulse: 70 64  Resp: 14 18  Temp:  97.9 F (36.6 C)  SpO2: 93% 97%   Last BM Date : 10/24/23 (prior to admin per pt) General:   Elderly patient, somewhat confused but alert and oriented x 2 Lungs: No visible respiratory distress Heart:  Regular rate and rhythm; no murmurs, clicks, rubs,  or gallops. Abdomen: Soft, nontender, nondistended, bowel sound present, no peritoneal signs Rectal:  Deferred  GI:  Lab Results: Recent Labs    10/24/23 2107 10/25/23 0510  WBC 9.3  --   HGB 11.4* 9.9*  HCT 36.8 32.2*  PLT 179  --    BMET Recent Labs    10/24/23 2107  NA 139  K 3.7  CL 104  CO2 25  GLUCOSE 165*  BUN 18  CREATININE 0.64  CALCIUM  9.2   LFT Recent Labs    10/24/23 2107  PROT 6.6  ALBUMIN 3.7  AST 20  ALT 10   ALKPHOS 66  BILITOT 0.6   PT/INR No results for input(s): "LABPROT", "INR" in the last 72 hours.   Studies/Results: No results found.  Impression/Plan: -Melena in a patient with history of recurrent GI bleed with multiple endoscopy and colonoscopy in the past.  Was found to have inflammation in the right colon with biopsy showing collagenous colitis. 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 - Chronic anemia  Recommendations ------------------------ - Patient with history of large 20 mm sessile serrated adenoma in the ascending colon which needs removal.  Given her reported history of melena, also recommend EGD.  Melena could be from right colon bleed.   -Plan for EGD and colonoscopy tomorrow. - Okay to have clear liquids diet today.  Keep n.p.o. past midnight.  Risks (bleeding, infection, bowel perforation that could require surgery, sedation-related changes in cardiopulmonary systems), benefits (identification and possible treatment of source of symptoms, exclusion of certain causes of symptoms), and alternatives (watchful waiting, radiographic imaging studies, empiric medical treatment)  were explained to patient/family in detail and patient wishes to proceed.    LOS: 0 days   Felecia Hopper  MD, FACP 10/25/2023, 8:48 AM  Contact #  [phone]

## 2023-10-25 NOTE — H&P (View-Only) (Signed)
 Referring Provider: TH Primary Care Physician:  Arnetha Bhat, NP Primary Gastroenterologist:  Dr. Lavaughn Portland  Reason for Consultation: Dark stool, history of GI bleed  HPI: [name] is a 83 y.o. female with past medical history of recurrent GI bleed requiring multiple endoscopies and colonoscopy as mentioned below, multiple negative CT angio in the past.  Nuclear GI bleed scan in November 2023 showed possible bleeding in colon.  She presented to the hospital with 3 episodes of melena. Patient seen and examined at bedside.  According the patient, she cannot see very well so she is not sure how long she has been having dark-colored stool.  She denies any abdominal pain, nausea or vomiting.  EGD in October 2024 by Dr. Honey Lusty showed gastritis multiple gastric polyps.  Capsule endoscopy was deployed.  Colonoscopy in October 2024 showed 20 mm polyp in the proximal ascending colon.  Polyp was semisessile.  Biopsy was performed.  Colonoscopy also showed inflamed and ulcerated mucosa in the proximal colon in the cecum as well as diverticulosis.  Biopsy from the polyp showed sessile serrated polyp.  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  Capsule endoscopy showed no evidence of small bowel bleed.  Showed possible AVM in the proximal colon which could be the inflammation seen during colonoscopy.  Previous GI workup: EGD, Dr. Lavaughn Portland, melena, inpatient, 01/06/2023: Polyps in gastric body, mild inflammation, no bleeding Colonoscopy, Dr. Feliberto Hopping, inpatient, hematochezia: Dark blood found in entire colon, diverticulosis in sigmoid, descending, splenic flexure, transverse, hepatic flexure and ascending colon, 2 sessile serrated adenomas removed EGD, melena, inpatient, Dr. Lavaughn Portland, 01/11/2022: Tiny hiatal hernia, food residue, otherwise unremarkable EGD, Dr. Feliberto Hopping, inpatient, dysphagia, dysmotility, esophageal stricture on barium swallow, 05/08/2021: Candida esophagitis, empiric balloon dilation performed,  gastric polyps, otherwise unremarkable   Past Medical History:  Diagnosis Date   Anxiety    Arthritis    "bilateral knees, shoulders, elbows; neck, pretty widespread" (09/05/2017)   BPPV (benign paroxysmal positional vertigo)    Depression    GERD (gastroesophageal reflux disease)    Glaucoma, both eyes    Headache    "probably 2/month" (09/05/2017)   History of blood transfusion ~ 2008   "related to LGIB"   Hyperlipemia    Lower GI bleeding ~ 2008; 09/05/2017   "had to have blood transfusion"   Macular degeneration, bilateral    Osteopenia    Seasonal allergies    Skin cancer, basal cell 2001   "off my nose, left side"   Sleeping excessive    Tinnitus of both ears     Past Surgical History:  Procedure Laterality Date   BALLOON DILATION N/A 05/08/2021   Procedure: BALLOON DILATION;  Surgeon: Genell Ken, MD;  Location: [facility] ENDOSCOPY;  Service: Gastroenterology;  Laterality: N/A;   BASAL CELL CARCINOMA EXCISION  2001   "off my nose, left side"   BIOPSY  05/08/2021   Procedure: BIOPSY;  Surgeon: Genell Ken, MD;  Location: [facility] ENDOSCOPY;  Service: Gastroenterology;;   BIOPSY  04/25/2023   Procedure: BIOPSY;  Surgeon: Baldo Bonds, MD;  Location: [facility] ENDOSCOPY;  Service: Gastroenterology;;   BLEPHAROPLASTY Bilateral    CATARACT EXTRACTION W/ INTRAOCULAR LENS  IMPLANT, BILATERAL Bilateral 1990's   COLONOSCOPY WITH PROPOFOL  N/A 05/04/2022   Procedure: COLONOSCOPY WITH PROPOFOL ;  Surgeon: Genell Ken, MD;  Location: [facility] ENDOSCOPY;  Service: Gastroenterology;  Laterality: N/A;   COLONOSCOPY WITH PROPOFOL  N/A 04/25/2023   Procedure: COLONOSCOPY WITH PROPOFOL ;  Surgeon: Baldo Bonds, MD;  Location: [facility] ENDOSCOPY;  Service: Gastroenterology;  Laterality: N/A;   ESOPHAGOGASTRODUODENOSCOPY (EGD) WITH PROPOFOL  N/A 05/08/2021   Procedure: ESOPHAGOGASTRODUODENOSCOPY (EGD) WITH PROPOFOL ;  Surgeon: Genell Ken, MD;  Location: [facility] ENDOSCOPY;  Service: Gastroenterology;  Laterality: N/A;    ESOPHAGOGASTRODUODENOSCOPY (EGD) WITH PROPOFOL  N/A 01/11/2022   Procedure: ESOPHAGOGASTRODUODENOSCOPY (EGD) WITH PROPOFOL ;  Surgeon: Ozell Blunt, MD;  Location: [facility] ENDOSCOPY;  Service: Gastroenterology;  Laterality: N/A;   ESOPHAGOGASTRODUODENOSCOPY (EGD) WITH PROPOFOL  N/A 01/06/2023   Procedure: ESOPHAGOGASTRODUODENOSCOPY (EGD) WITH PROPOFOL ;  Surgeon: Ozell Blunt, MD;  Location: [facility] ENDOSCOPY;  Service: Gastroenterology;  Laterality: N/A;   ESOPHAGOGASTRODUODENOSCOPY (EGD) WITH PROPOFOL  N/A 04/21/2023   Procedure: ESOPHAGOGASTRODUODENOSCOPY (EGD) WITH PROPOFOL ;  Surgeon: Baldo Bonds, MD;  Location: [facility] ENDOSCOPY;  Service: Gastroenterology;  Laterality: N/A;   EYE SURGERY Bilateral    "to improve vision after cataract OR"   GIVENS CAPSULE STUDY N/A 04/21/2023   Procedure: GIVENS CAPSULE STUDY;  Surgeon: Baldo Bonds, MD;  Location: [facility] ENDOSCOPY;  Service: Gastroenterology;  Laterality: N/A;   IR ANGIOGRAM FOLLOW UP STUDY  12/19/2020   IR ANGIOGRAM SELECTIVE EACH ADDITIONAL VESSEL  12/19/2020   IR ANGIOGRAM SELECTIVE EACH ADDITIONAL VESSEL  12/19/2020   IR ANGIOGRAM VISCERAL SELECTIVE  12/19/2020   IR EMBO ART  VEN HEMORR LYMPH EXTRAV  INC GUIDE ROADMAPPING  12/19/2020   IR US  GUIDE VASC ACCESS RIGHT  12/19/2020   JOINT REPLACEMENT     POLYPECTOMY  05/04/2022   Procedure: POLYPECTOMY;  Surgeon: Genell Ken, MD;  Location: [facility] ENDOSCOPY;  Service: Gastroenterology;;   STAPEDES SURGERY Left    "scraped stapedes because it was sticking when it wasn't suppose to"   TONSILLECTOMY AND ADENOIDECTOMY  1946   TOTAL KNEE ARTHROPLASTY Left ~ 2008   TOTAL KNEE ARTHROPLASTY  12/20/2011   Procedure: TOTAL KNEE ARTHROPLASTY;  Surgeon: Lorriane Rote, MD;  Location: [facility] OR;  Service: Orthopedics;  Laterality: Right;  Right Total Knee Arthroplasty   TUBAL LIGATION  1980's    Prior to Admission medications   Medication Sig Start Date End Date Taking? Authorizing Provider  acetaminophen  (TYLENOL ) 500 MG  tablet Take 1,000 mg by mouth in the morning and at bedtime.    [provider]  acetaminophen  (TYLENOL ) 650 MG CR tablet Take 1,300 mg by mouth at bedtime.    [provider]  albuterol  (VENTOLIN  HFA) 108 (90 Base) MCG/ACT inhaler Inhale 2 puffs into the lungs every 4 (four) hours as needed for wheezing or shortness of breath.    [provider]  alendronate (FOSAMAX) 70 MG tablet Take 70 mg by mouth every Sunday. Take with a full glass of water on an empty stomach.    [provider]  Ascorbic Acid  (VITAMIN C ) 1000 MG tablet Take 1,000 mg by mouth every morning.    [provider]  cetirizine  (ZYRTEC ) 5 MG chewable tablet Chew 5 mg by mouth daily as needed for allergies.    [provider]  Cholecalciferol  (VITAMIN D3) 50 MCG (2000 UT) TABS Take 2,000 Units by mouth every morning.    [provider]  cyanocobalamin  (VITAMIN B12) 1000 MCG tablet Take 1,000 mcg by mouth daily.    [provider]  docusate sodium  (COLACE) 100 MG capsule Take 200 mg by mouth at bedtime.    [provider]  Dorzolamide  HCl-Timolol  Mal PF 2-0.5 % SOLN Place 1 drop into both eyes in the morning and at bedtime.    [provider]  ferrous gluconate  (FERGON) 240 (27 FE) MG tablet Take 240 mg by mouth every  morning. Take without food    [provider]  fluticasone  (FLONASE ) 50 MCG/ACT nasal spray Place 1 spray into both nostrils daily as needed for allergies or rhinitis.    [provider]  guaiFENesin  (ROBITUSSIN) 100 MG/5ML liquid Take 10 mLs by mouth every 4 (four) hours as needed for cough or to loosen phlegm. 04/28/23   Rai, Hurman Maiden, MD  latanoprost  (XALATAN ) 0.005 % ophthalmic solution Place 1 drop into both eyes at bedtime.    [provider]  Multiple Vitamins-Minerals (PRESERVISION AREDS 2) CAPS Take 1 capsule by mouth 2 (two) times daily.    [provider]  Netarsudil  Dimesylate  (RHOPRESSA ) 0.02 % SOLN Place 1 drop into both eyes at bedtime.    [provider]  oxybutynin  (DITROPAN -XL) 5 MG 24 hr tablet Take 5 mg by mouth at bedtime. 11/22/22   [provider]  pantoprazole  (PROTONIX ) 40 MG tablet Take 40 mg by mouth 2 (two) times daily.    [provider]  polyethylene glycol (MIRALAX  / GLYCOLAX ) 17 g packet Take 8.5 g by mouth daily as needed (constipation).    [provider]  potassium chloride  SA (KLOR-CON  M) 20 MEQ tablet Take 20 mEq by mouth once.    [provider]  Probiotic Product (ALIGN) 4 MG CAPS Take 4 mg by mouth at bedtime.    [provider]  rOPINIRole  (REQUIP ) 0.5 MG tablet Take 1 tablet (0.5 mg total) by mouth daily as needed. 04/30/23   [name], [name], NP  sertraline  (ZOLOFT ) 50 MG tablet Take 1.5 tablets (75 mg total) by mouth daily. 01/07/23   Samtani, Jai-Gurmukh, MD  simvastatin  (ZOCOR ) 20 MG tablet Take 20 mg by mouth every evening.    [provider]  Sodium Fluoride  (PREVIDENT 5000 BOOSTER PLUS) 1.1 % PSTE Place 1 Application onto teeth See admin instructions. Brush on teeth with a toothbrush after evening mouth care. Spit out excess and do not rinse.    [provider]  torsemide (DEMADEX) 20 MG tablet Take 20 mg by mouth daily.    [provider]  umeclidinium-vilanterol (ANORO ELLIPTA ) 62.5-25 MCG/ACT AEPB Inhale 1 puff into the lungs every morning.    [provider]  zinc oxide 20 % ointment Apply 1 Application topically See admin instructions. Apply topically to buttocks after every incontinent episode and as needed for redness    [provider]    Scheduled Meds:  pantoprazole  (PROTONIX ) IV  40 mg Intravenous Q12H   Continuous Infusions:  sodium chloride  125 mL/hr at 10/25/23 0841   PRN Meds:.acetaminophen  **OR** acetaminophen   Allergies as of 10/24/2023 - Review Complete 08/04/2023  Allergen Reaction Noted   Morphine and codeine Itching  11/29/2011   Aspirin Other (See Comments) 10/22/2020   Duloxetine  hcl Other (See Comments) 10/22/2020   Tymlos [abaloparatide] Other (See Comments) 10/22/2020   Ventolin  [albuterol ] Other (See Comments) 12/25/2020   Cefadroxil Hives 12/11/2011    Family History  Problem Relation Age of Onset   Heart attack Mother    Heart disease Father    Heart failure Father    Bladder Cancer Father    Supraventricular tachycardia Sister    Hypertension Sister     Social History   Socioeconomic History   Marital status: Married    Spouse name: Josiah Nigh    Number of children: 3   Years of education: College   Highest education level: Not on file  Occupational History   Occupation: Retired Runner, broadcasting/film/video  Tobacco  Use   Smoking status: Former    Current packs/day: 0.00    Average packs/day: 0.8 packs/day for 37.0 years (27.8 ttl pk-yrs)    Types: Cigarettes    Start date: 07/02/1955    Quit date: 07/01/1992    Years since quitting: 31.3    Passive exposure: Never   Smokeless tobacco: Never  Vaping Use   Vaping status: Never Used  Substance and Sexual Activity   Alcohol  use: Yes    Alcohol /week: 7.0 standard drinks of alcohol     Types: 7 Glasses of wine per week    Comment: 09/05/2017  "6 oz wine, 5 days/wk"   Drug use: No    Types: Hydrocodone    Sexual activity: Not Currently  Other Topics Concern   Not on file  Social History Narrative   1-2 cups of coffee a day    Social Drivers of Health   Financial Resource Strain: Low Risk  (04/04/2023)   Overall Financial Resource Strain (CARDIA)    Difficulty of Paying Living Expenses: Not hard at all  Food Insecurity: No Food Insecurity (10/25/2023)   Hunger Vital Sign    Worried About Running Out of Food in the Last Year: Never true    Ran Out of Food in the Last Year: Never true  Transportation Needs: No Transportation Needs (10/25/2023)   PRAPARE - Administrator, Civil Service (Medical): No    Lack of Transportation (Non-Medical): No   Physical Activity: Insufficiently Active (04/04/2023)   Exercise Vital Sign    Days of Exercise per Week: 2 days    Minutes of Exercise per Session: 20 min  Stress: No Stress Concern Present (04/04/2023)   [employer] of Occupational Health - Occupational Stress Questionnaire    Feeling of Stress : Only a little  Social Connections: Socially Integrated (10/25/2023)   Social Connection and Isolation Panel [NHANES]    Frequency of Communication with Friends and Family: More than three times a week    Frequency of Social Gatherings with Friends and Family: More than three times a week    Attends Religious Services: 1 to 4 times per year    Active Member of [employer] or Organizations: Yes    Attends Banker Meetings: 1 to 4 times per year    Marital Status: Married  Catering manager Violence: Not At Risk (10/25/2023)   Humiliation, Afraid, Rape, and Kick questionnaire    Fear of Current or Ex-Partner: No    Emotionally Abused: No    Physically Abused: No    Sexually Abused: No    Review of Systems: All negative except as stated above in HPI.  Physical Exam: Vital signs: Vitals:   10/25/23 0500 10/25/23 0553  BP: (!) 116/55 130/61  Pulse: 70 64  Resp: 14 18  Temp:  97.9 F (36.6 C)  SpO2: 93% 97%   Last BM Date : 10/24/23 (prior to admin per pt) General:   Elderly patient, somewhat confused but alert and oriented x 2 Lungs: No visible respiratory distress Heart:  Regular rate and rhythm; no murmurs, clicks, rubs,  or gallops. Abdomen: Soft, nontender, nondistended, bowel sound present, no peritoneal signs Rectal:  Deferred  GI:  Lab Results: Recent Labs    10/24/23 2107 10/25/23 0510  WBC 9.3  --   HGB 11.4* 9.9*  HCT 36.8 32.2*  PLT 179  --    BMET Recent Labs    10/24/23 2107  NA 139  K 3.7  CL 104  CO2 25  GLUCOSE 165*  BUN 18  CREATININE 0.64  CALCIUM  9.2   LFT Recent Labs    10/24/23 2107  PROT 6.6  ALBUMIN 3.7  AST 20  ALT 10   ALKPHOS 66  BILITOT 0.6   PT/INR No results for input(s): "LABPROT", "INR" in the last 72 hours.   Studies/Results: No results found.  Impression/Plan: -Melena in a patient with history of recurrent GI bleed with multiple endoscopy and colonoscopy in the past.  Was found to have inflammation in the right colon with biopsy showing collagenous colitis. 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 - Chronic anemia  Recommendations ------------------------ - Patient with history of large 20 mm sessile serrated adenoma in the ascending colon which needs removal.  Given her reported history of melena, also recommend EGD.  Melena could be from right colon bleed.   -Plan for EGD and colonoscopy tomorrow. - Okay to have clear liquids diet today.  Keep n.p.o. past midnight.  Risks (bleeding, infection, bowel perforation that could require surgery, sedation-related changes in cardiopulmonary systems), benefits (identification and possible treatment of source of symptoms, exclusion of certain causes of symptoms), and alternatives (watchful waiting, radiographic imaging studies, empiric medical treatment)  were explained to patient/family in detail and patient wishes to proceed.    LOS: 0 days   Felecia Hopper  MD, FACP 10/25/2023, 8:48 AM  Contact #  [phone]

## 2023-10-25 NOTE — Progress Notes (Signed)
 Mobility Specialist - Progress Note   10/25/23 1046  Mobility  Activity Transferred from chair to bed  Level of Assistance Contact guard assist, steadying assist  Assistive Device Front wheel walker  Range of Motion/Exercises Active  Activity Response Tolerated well  Mobility Referral Yes  Mobility visit 1 Mobility  Mobility Specialist Start Time (ACUTE ONLY) 1037  Mobility Specialist Stop Time (ACUTE ONLY) 1046  Mobility Specialist Time Calculation (min) (ACUTE ONLY) 9 min   Pt requested to transfer back to bed. Assisted with transfer and was left with all needs met. Call bell in reach.  Lorna Rose Mobility Specialist

## 2023-10-25 NOTE — Progress Notes (Signed)
 Mobility Specialist - Progress Note   10/25/23 0951  Mobility  Activity Ambulated with assistance to bathroom;Ambulated with assistance in hallway  Level of Assistance Contact guard assist, steadying assist  Assistive Device Front wheel walker  Distance Ambulated (ft) 40 ft  Range of Motion/Exercises Active  Activity Response Tolerated well  Mobility Referral Yes  Mobility visit 1 Mobility  Mobility Specialist Start Time (ACUTE ONLY) 0930  Mobility Specialist Stop Time (ACUTE ONLY) 0951  Mobility Specialist Time Calculation (min) (ACUTE ONLY) 21 min   Pt was found in bed and agreeable to ambulate. Stated having urge to use bathroom and assisted to bathroom prior to hallway ambulation. At EOS returned to recliner chair with all needs met. Call bell in reach and chair alarm on.  Lorna Rose Mobility Specialist

## 2023-10-25 NOTE — Progress Notes (Addendum)
  Progress Note   Patient: [name]:[id number] DOB: 1940-10-05 DOA: 10/24/2023     0 DOS: the patient was seen and examined on 10/25/2023   Brief hospital course: 83 year old woman complex PMH with recurrent GI bleed and multiple investigations in the past, who presented with melena.  Seen by gastroenterology with plan for upper and lower endoscopy 4/27.  Consultants [facility]  Procedures/Events   Assessment and Plan: GI bleed Chronic anemia Large melena reported at assisted living.  None reported here.  No hematemesis. Continue Protonix .  Appreciate GI consultation.  Plan for EGD and colonoscopy tomorrow. Trend CBC   Chronic diastolic CHF Last echo done in November 2022 showing EF 60 to 65%, mild mitral regurgitation, mild aortic regurgitation.  Appears stable.  BNP within normal limits. Resume torsemide   Essential hypertension Blood pressure stable.   Hyperlipidemia RLS COPD Resume bronchodilators Anxiety/depression Resume Zoloft  Overactive bladder Resume oxybutynin  Glaucoma Resume home medications    Subjective:  No pain No bleeding in room Feels ok  Physical Exam: Vitals:   10/25/23 0415 10/25/23 0500 10/25/23 0553 10/25/23 0600  BP: (!) 100/57 (!) 116/55 130/61   Pulse: 70 70 64   Resp:  14 18   Temp:   97.9 F (36.6 C)   TempSrc:   Oral   SpO2: 94% 93% 97%   Weight:    77.2 kg  Height:    5\' 1"  (1.549 m)   Physical Exam Vitals reviewed.  Constitutional:      General: She is not in acute distress.    Appearance: She is not ill-appearing or toxic-appearing.  Cardiovascular:     Rate and Rhythm: Normal rate and regular rhythm.     Heart sounds: No murmur heard. Pulmonary:     Effort: Pulmonary effort is normal. No respiratory distress.     Breath sounds: No wheezing or rhonchi.  Neurological:     Mental Status: She is alert.  Psychiatric:        Mood and Affect: Mood normal.        Behavior: Behavior normal.   Data Reviewed: CMP  noted BNP WNL Hgb 11.4 > 9.9  Family Communication: husband at bedside  Disposition: Status is: Observation      Time spent: 25 minutes  Author: Jerline Moon, MD 10/25/2023 1:25 PM  For on call review [email or web address].

## 2023-10-25 NOTE — H&P (Signed)
 History and Physical    [name]:[id number] DOB: [birth year] DOA: 10/24/2023  PCP: Arnetha Bhat, NP  Chief Complaint: Dark stools  HPI: [name] is a 83 y.o. female with medical history significant of chronic HFpEF, hypertension, hyperlipidemia, pulmonary nodule, RLS, COPD, chronic bronchitis, aortic atherosclerosis, GERD, anxiety/depression, BPPV, glaucoma, macular degeneration, gastritis, gastric polyps, diverticulosis, history of GI bleed, chronic iron  deficiency anemia, osteoarthritis, osteoporosis, history of skin cancer, overactive bladder presented to the ED for evaluation of melena.  Hemodynamically stable.  Labs notable for hemoglobin 11.4 (stable) and melena noted on rectal exam. ED physician discussed with Dr. Lavaughn Portland and [facility] will consult.  Patient was given IV Protonix  40 mg.  TRH called to admit.  Patient is reporting melena since yesterday.  She had some mild upper abdominal pain about 2 or 3 days ago which has since resolved.  Denies any abdominal pain or discomfort at this time.  Denies hematemesis.  She does not take any blood thinners.  Denies NSAID and alcohol  use.  Denies lightheadedness/dizziness, chest pain, shortness of breath, or cough.  Reports chronic right lower leg/ankle edema since after she had an ankle fracture in the past.  Not endorsing any leg pain.  Review of Systems:  Review of Systems  All other systems reviewed and are negative.   Past Medical History:  Diagnosis Date   Anxiety    Arthritis    "bilateral knees, shoulders, elbows; neck, pretty widespread" (09/05/2017)   BPPV (benign paroxysmal positional vertigo)    Depression    GERD (gastroesophageal reflux disease)    Glaucoma, both eyes    Headache    "probably 2/month" (09/05/2017)   History of blood transfusion ~ 2008   "related to LGIB"   Hyperlipemia    Lower GI bleeding ~ 2008; 09/05/2017   "had to have blood transfusion"   Macular degeneration, bilateral    Osteopenia     Seasonal allergies    Skin cancer, basal cell 2001   "off my nose, left side"   Sleeping excessive    Tinnitus of both ears     Past Surgical History:  Procedure Laterality Date   BALLOON DILATION N/A 05/08/2021   Procedure: BALLOON DILATION;  Surgeon: Genell Ken, MD;  Location: [facility] ENDOSCOPY;  Service: Gastroenterology;  Laterality: N/A;   BASAL CELL CARCINOMA EXCISION  2001   "off my nose, left side"   BIOPSY  05/08/2021   Procedure: BIOPSY;  Surgeon: Genell Ken, MD;  Location: [facility] ENDOSCOPY;  Service: Gastroenterology;;   BIOPSY  04/25/2023   Procedure: BIOPSY;  Surgeon: Baldo Bonds, MD;  Location: [facility] ENDOSCOPY;  Service: Gastroenterology;;   BLEPHAROPLASTY Bilateral    CATARACT EXTRACTION W/ INTRAOCULAR LENS  IMPLANT, BILATERAL Bilateral 1990's   COLONOSCOPY WITH PROPOFOL  N/A 05/04/2022   Procedure: COLONOSCOPY WITH PROPOFOL ;  Surgeon: Genell Ken, MD;  Location: [facility] ENDOSCOPY;  Service: Gastroenterology;  Laterality: N/A;   COLONOSCOPY WITH PROPOFOL  N/A 04/25/2023   Procedure: COLONOSCOPY WITH PROPOFOL ;  Surgeon: Baldo Bonds, MD;  Location: [facility] ENDOSCOPY;  Service: Gastroenterology;  Laterality: N/A;   ESOPHAGOGASTRODUODENOSCOPY (EGD) WITH PROPOFOL  N/A 05/08/2021   Procedure: ESOPHAGOGASTRODUODENOSCOPY (EGD) WITH PROPOFOL ;  Surgeon: Genell Ken, MD;  Location: [facility] ENDOSCOPY;  Service: Gastroenterology;  Laterality: N/A;   ESOPHAGOGASTRODUODENOSCOPY (EGD) WITH PROPOFOL  N/A 01/11/2022   Procedure: ESOPHAGOGASTRODUODENOSCOPY (EGD) WITH PROPOFOL ;  Surgeon: Ozell Blunt, MD;  Location: [facility] ENDOSCOPY;  Service: Gastroenterology;  Laterality: N/A;   ESOPHAGOGASTRODUODENOSCOPY (EGD) WITH PROPOFOL  N/A 01/06/2023   Procedure: ESOPHAGOGASTRODUODENOSCOPY (EGD)  WITH PROPOFOL ;  Surgeon: Ozell Blunt, MD;  Location: [facility] ENDOSCOPY;  Service: Gastroenterology;  Laterality: N/A;   ESOPHAGOGASTRODUODENOSCOPY (EGD) WITH PROPOFOL  N/A 04/21/2023   Procedure: ESOPHAGOGASTRODUODENOSCOPY (EGD) WITH PROPOFOL ;   Surgeon: Baldo Bonds, MD;  Location: [facility] ENDOSCOPY;  Service: Gastroenterology;  Laterality: N/A;   EYE SURGERY Bilateral    "to improve vision after cataract OR"   GIVENS CAPSULE STUDY N/A 04/21/2023   Procedure: GIVENS CAPSULE STUDY;  Surgeon: Baldo Bonds, MD;  Location: [facility] ENDOSCOPY;  Service: Gastroenterology;  Laterality: N/A;   IR ANGIOGRAM FOLLOW UP STUDY  12/19/2020   IR ANGIOGRAM SELECTIVE EACH ADDITIONAL VESSEL  12/19/2020   IR ANGIOGRAM SELECTIVE EACH ADDITIONAL VESSEL  12/19/2020   IR ANGIOGRAM VISCERAL SELECTIVE  12/19/2020   IR EMBO ART  VEN HEMORR LYMPH EXTRAV  INC GUIDE ROADMAPPING  12/19/2020   IR US  GUIDE VASC ACCESS RIGHT  12/19/2020   JOINT REPLACEMENT     POLYPECTOMY  05/04/2022   Procedure: POLYPECTOMY;  Surgeon: Genell Ken, MD;  Location: [facility] ENDOSCOPY;  Service: Gastroenterology;;   STAPEDES SURGERY Left    "scraped stapedes because it was sticking when it wasn't suppose to"   TONSILLECTOMY AND ADENOIDECTOMY  1946   TOTAL KNEE ARTHROPLASTY Left ~ 2008   TOTAL KNEE ARTHROPLASTY  12/20/2011   Procedure: TOTAL KNEE ARTHROPLASTY;  Surgeon: Lorriane Rote, MD;  Location: [facility] OR;  Service: Orthopedics;  Laterality: Right;  Right Total Knee Arthroplasty   TUBAL LIGATION  1980's     reports that she quit smoking about 31 years ago. Her smoking use included cigarettes. She started smoking about 68 years ago. She has a 27.8 pack-year smoking history. She has never used smokeless tobacco. She reports current alcohol  use of about 7.0 standard drinks of alcohol  per week. She reports that she does not use drugs.  Allergies  Allergen Reactions   Morphine And Codeine Itching   Aspirin Other (See Comments)    Unknown reaction   Duloxetine  Hcl Other (See Comments)    Unknown reaction   Tymlos [Abaloparatide] Other (See Comments)    Unknown reaction   Ventolin  [Albuterol ] Other (See Comments)    rapid heart beat   Cefadroxil Hives    Patient can take amoxicillin  and  cipro     Family History  Problem Relation Age of Onset   Heart attack Mother    Heart disease Father    Heart failure Father    Bladder Cancer Father    Supraventricular tachycardia Sister    Hypertension Sister     Prior to Admission medications   Medication Sig Start Date End Date Taking? Authorizing Provider  acetaminophen  (TYLENOL ) 500 MG tablet Take 1,000 mg by mouth in the morning and at bedtime.    [provider]  acetaminophen  (TYLENOL ) 650 MG CR tablet Take 1,300 mg by mouth at bedtime.    [provider]  albuterol  (VENTOLIN  HFA) 108 (90 Base) MCG/ACT inhaler Inhale 2 puffs into the lungs every 4 (four) hours as needed for wheezing or shortness of breath.    [provider]  alendronate (FOSAMAX) 70 MG tablet Take 70 mg by mouth every Sunday. Take with a full glass of water on an empty stomach.    [provider]  Ascorbic Acid  (VITAMIN C ) 1000 MG tablet Take 1,000 mg by mouth every morning.    [provider]  cetirizine  (ZYRTEC ) 5 MG chewable tablet Chew 5 mg by mouth daily as needed for allergies.    [provider]  Cholecalciferol  (VITAMIN D3) 50 MCG (2000 UT) TABS Take 2,000 Units by mouth every morning.    [provider]  cyanocobalamin  (VITAMIN B12) 1000 MCG tablet Take 1,000 mcg by mouth daily.    [provider]  docusate sodium  (COLACE) 100 MG capsule Take 200 mg by mouth at bedtime.    [provider]  Dorzolamide  HCl-Timolol  Mal PF 2-0.5 % SOLN Place 1 drop into both eyes in the morning and at bedtime.    [provider]  ferrous gluconate  (FERGON) 240 (27 FE) MG tablet Take 240 mg by mouth every morning. Take without food    [provider]  fluticasone  (FLONASE ) 50 MCG/ACT nasal spray Place 1 spray into both nostrils daily as needed for allergies or rhinitis.    [provider]  guaiFENesin  (ROBITUSSIN) 100 MG/5ML liquid Take 10 mLs by mouth every 4 (four)  hours as needed for cough or to loosen phlegm. 04/28/23   Rai, Ripudeep K, MD  latanoprost  (XALATAN ) 0.005 % ophthalmic solution Place 1 drop into both eyes at bedtime.    [provider]  Multiple Vitamins-Minerals (PRESERVISION AREDS 2) CAPS Take 1 capsule by mouth 2 (two) times daily.    [provider]  Netarsudil  Dimesylate (RHOPRESSA ) 0.02 % SOLN Place 1 drop into both eyes at bedtime.    [provider]  oxybutynin  (DITROPAN -XL) 5 MG 24 hr tablet Take 5 mg by mouth at bedtime. 11/22/22   [provider]  pantoprazole  (PROTONIX ) 40 MG tablet Take 40 mg by mouth 2 (two) times daily.    [provider]  polyethylene glycol (MIRALAX  / GLYCOLAX ) 17 g packet Take 8.5 g by mouth daily as needed (constipation).    [provider]  potassium chloride  SA (KLOR-CON  M) 20 MEQ tablet Take 20 mEq by mouth once.    [provider]  Probiotic Product (ALIGN) 4 MG CAPS Take 4 mg by mouth at bedtime.    [provider]  rOPINIRole  (REQUIP ) 0.5 MG tablet Take 1 tablet (0.5 mg total) by mouth daily as needed. 04/30/23   [name], [name], NP  sertraline  (ZOLOFT ) 50 MG tablet Take 1.5 tablets (75 mg total) by mouth daily. 01/07/23   Samtani, Jai-Gurmukh, MD  simvastatin  (ZOCOR ) 20 MG tablet Take 20 mg by mouth every evening.    [provider]  Sodium Fluoride  (PREVIDENT 5000 BOOSTER PLUS) 1.1 % PSTE Place 1 Application onto teeth See admin instructions. Brush on teeth with a toothbrush after evening mouth care. Spit out excess and do not rinse.    [provider]  torsemide (DEMADEX) 20 MG tablet Take 20 mg by mouth daily.    [provider]  umeclidinium-vilanterol (ANORO ELLIPTA ) 62.5-25 MCG/ACT AEPB Inhale 1 puff into the lungs every morning.    [provider]  zinc oxide 20 % ointment Apply 1 Application topically See admin instructions. Apply topically to buttocks after every incontinent episode and as needed  for redness    [provider]    Physical Exam: Vitals:   10/25/23 0102 10/25/23 0200 10/25/23 0245 10/25/23 0330  BP:  (!) 99/58 114/61 (!) 100/53  Pulse:  76 95 69  Resp:   16 14  Temp: 99.5 F (37.5 C)     TempSrc: Oral     SpO2:  93% 95% 95%    Physical Exam Vitals reviewed.  Constitutional:      General: She is not in acute distress. HENT:  Head: Normocephalic and atraumatic.  Eyes:     Extraocular Movements: Extraocular movements intact.  Cardiovascular:     Rate and Rhythm: Normal rate and regular rhythm.     Pulses: Normal pulses.  Pulmonary:     Effort: Pulmonary effort is normal. No respiratory distress.     Breath sounds: Normal breath sounds. No wheezing or rales.  Abdominal:     General: Bowel sounds are normal. There is no distension.     Palpations: Abdomen is soft.     Tenderness: There is no abdominal tenderness. There is no guarding.  Musculoskeletal:     Cervical back: Normal range of motion.     Right lower leg: Edema present.  Skin:    General: Skin is warm and dry.  Neurological:     General: No focal deficit present.     Mental Status: She is alert and oriented to person, place, and time.     Labs on Admission: I have personally reviewed following labs and imaging studies  CBC: Recent Labs  Lab 10/24/23 2107  WBC 9.3  HGB 11.4*  HCT 36.8  MCV 101.9*  PLT 179   Basic Metabolic Panel: Recent Labs  Lab 10/24/23 2107  NA 139  K 3.7  CL 104  CO2 25  GLUCOSE 165*  BUN 18  CREATININE 0.64  CALCIUM  9.2   GFR: Estimated Creatinine Clearance: 50 mL/min (by C-G formula based on SCr of 0.64 mg/dL). Liver Function Tests: Recent Labs  Lab 10/24/23 2107  AST 20  ALT 10  ALKPHOS 66  BILITOT 0.6  PROT 6.6  ALBUMIN 3.7   No results for input(s): "LIPASE", "AMYLASE" in the last 168 hours. No results for input(s): "AMMONIA" in the last 168 hours. Coagulation Profile: No results for input(s): "INR", "PROTIME" in the  last 168 hours. Cardiac Enzymes: No results for input(s): "CKTOTAL", "CKMB", "CKMBINDEX", "TROPONINI" in the last 168 hours. BNP (last 3 results) No results for input(s): "PROBNP" in the last 8760 hours. HbA1C: No results for input(s): "HGBA1C" in the last 72 hours. CBG: No results for input(s): "GLUCAP" in the last 168 hours. Lipid Profile: No results for input(s): "CHOL", "HDL", "LDLCALC", "TRIG", "CHOLHDL", "LDLDIRECT" in the last 72 hours. Thyroid  Function Tests: No results for input(s): "TSH", "T4TOTAL", "FREET4", "T3FREE", "THYROIDAB" in the last 72 hours. Anemia Panel: No results for input(s): "VITAMINB12", "FOLATE", "FERRITIN", "TIBC", "IRON ", "RETICCTPCT" in the last 72 hours. Urine analysis:    Component Value Date/Time   COLORURINE YELLOW 01/08/2022 0500   APPEARANCEUR HAZY (A) 01/08/2022 0500   LABSPEC 1.013 01/08/2022 0500   PHURINE 5.0 01/08/2022 0500   GLUCOSEU NEGATIVE 01/08/2022 0500   HGBUR NEGATIVE 01/08/2022 0500   BILIRUBINUR NEGATIVE 01/08/2022 0500   KETONESUR NEGATIVE 01/08/2022 0500   PROTEINUR NEGATIVE 01/08/2022 0500   UROBILINOGEN 0.2 10/20/2007 2015   NITRITE POSITIVE (A) 01/08/2022 0500   LEUKOCYTESUR MODERATE (A) 01/08/2022 0500    Radiological Exams on Admission: No results found.  Assessment and Plan  Acute upper GI bleed/melena Chronic anemia No hematemesis reported.  Abdominal exam benign.  Hemodynamically stable.  Hemoglobin 11.4, stable.  No NSAID or alcohol  use reported.  Not on anticoagulation.  History of previous GI bleeds and EGD done in October 2024 was showing gastritis and multiple gastric polyps. [facility] consulted -Continue IV Protonix  40 mg every 12 hours -Keep n.p.o. -Gentle IV fluid hydration -Type and screen -Monitor H&H -Transfuse PRBCs if hemoglobin less than 7  Right lower extremity/ankle edema Per patient,  this is a chronic issue since after she had an ankle fracture in the past.  Check D-dimer, if positive,  lower extremity Doppler ultrasound to rule out DVT.  Chronic HFpEF Last echo done in November 2022 showing EF 60 to 65%, mild mitral regurgitation, mild aortic regurgitation.  Lungs clear on exam.  No hypoxia or respiratory distress.  Check BNP.  Hypertension Avoid antihypertensives at this time in the setting of acute GI bleed.  Monitor blood pressure closely.  Hyperlipidemia RLS COPD: Stable, no signs of acute exacerbation. Anxiety/depression Overactive bladder Pharmacy med rec pending.  DVT prophylaxis: No anticoagulation in the setting of acute GI bleed.  No SCDs until D-dimer is checked. Code Status: DNR-prearrest interventions desired (discussed with the patient) Family Communication: No family available at this time. Consults called: [facility] Level of care: Progressive Care Unit Admission status: It is my clinical opinion that referral for OBSERVATION is reasonable and necessary in this patient based on the above information provided. The aforementioned taken together are felt to place the patient at high risk for further clinical deterioration. However, it is anticipated that the patient may be medically stable for discharge from the hospital within 24 to 48 hours.  Juliette Oh MD [facility]  If 7PM-7AM, please contact night-coverage [email or web address]  10/25/2023, 4:12 AM

## 2023-10-26 ENCOUNTER — Encounter (HOSPITAL_COMMUNITY): Payer: Self-pay | Admitting: Internal Medicine

## 2023-10-26 ENCOUNTER — Encounter (HOSPITAL_COMMUNITY)
Admission: EM | Disposition: A | Discharge status: Skilled Nursing Facility | Payer: Self-pay | Source: Home / Self Care | Attending: Emergency Medicine

## 2023-10-26 ENCOUNTER — Observation Stay (HOSPITAL_COMMUNITY): Admitting: Certified Registered Nurse Anesthetist

## 2023-10-26 DIAGNOSIS — K635 Polyp of colon: Secondary | ICD-10-CM | POA: Diagnosis not present

## 2023-10-26 DIAGNOSIS — D500 Iron deficiency anemia secondary to blood loss (chronic): Secondary | ICD-10-CM | POA: Diagnosis not present

## 2023-10-26 DIAGNOSIS — K921 Melena: Secondary | ICD-10-CM | POA: Diagnosis not present

## 2023-10-26 DIAGNOSIS — D122 Benign neoplasm of ascending colon: Secondary | ICD-10-CM | POA: Diagnosis not present

## 2023-10-26 DIAGNOSIS — K559 Vascular disorder of intestine, unspecified: Secondary | ICD-10-CM | POA: Diagnosis not present

## 2023-10-26 DIAGNOSIS — K317 Polyp of stomach and duodenum: Secondary | ICD-10-CM | POA: Diagnosis not present

## 2023-10-26 DIAGNOSIS — K920 Hematemesis: Secondary | ICD-10-CM | POA: Diagnosis not present

## 2023-10-26 DIAGNOSIS — D123 Benign neoplasm of transverse colon: Secondary | ICD-10-CM

## 2023-10-26 DIAGNOSIS — Z860100 Personal history of colon polyps, unspecified: Secondary | ICD-10-CM | POA: Diagnosis not present

## 2023-10-26 DIAGNOSIS — I5032 Chronic diastolic (congestive) heart failure: Secondary | ICD-10-CM | POA: Diagnosis not present

## 2023-10-26 DIAGNOSIS — K625 Hemorrhage of anus and rectum: Secondary | ICD-10-CM | POA: Diagnosis not present

## 2023-10-26 DIAGNOSIS — I110 Hypertensive heart disease with heart failure: Secondary | ICD-10-CM | POA: Diagnosis not present

## 2023-10-26 DIAGNOSIS — K2970 Gastritis, unspecified, without bleeding: Secondary | ICD-10-CM | POA: Diagnosis not present

## 2023-10-26 DIAGNOSIS — K648 Other hemorrhoids: Secondary | ICD-10-CM | POA: Diagnosis not present

## 2023-10-26 DIAGNOSIS — K529 Noninfective gastroenteritis and colitis, unspecified: Secondary | ICD-10-CM | POA: Diagnosis not present

## 2023-10-26 DIAGNOSIS — K922 Gastrointestinal hemorrhage, unspecified: Secondary | ICD-10-CM | POA: Diagnosis not present

## 2023-10-26 DIAGNOSIS — K5730 Diverticulosis of large intestine without perforation or abscess without bleeding: Secondary | ICD-10-CM | POA: Diagnosis not present

## 2023-10-26 HISTORY — PX: COLONOSCOPY: SHX5424

## 2023-10-26 HISTORY — PX: HEMOSTASIS CLIP PLACEMENT: SHX6857

## 2023-10-26 HISTORY — PX: BONE BIOPSY: SHX375

## 2023-10-26 HISTORY — PX: ESOPHAGOGASTRODUODENOSCOPY: SHX5428

## 2023-10-26 HISTORY — PX: POLYPECTOMY: SHX149

## 2023-10-26 LAB — BASIC METABOLIC PANEL WITH GFR
Anion gap: 8 (ref 5–15)
BUN: 9 mg/dL (ref 8–23)
CO2: 27 mmol/L (ref 22–32)
Calcium: 8.7 mg/dL — ABNORMAL LOW (ref 8.9–10.3)
Chloride: 106 mmol/L (ref 98–111)
Creatinine, Ser: 0.37 mg/dL — ABNORMAL LOW (ref 0.44–1.00)
GFR, Estimated: >60 mL/min (ref >60)
Glucose, Bld: 124 mg/dL — ABNORMAL HIGH (ref 70–99)
Potassium: 3.4 mmol/L — ABNORMAL LOW (ref 3.5–5.1)
Sodium: 141 mmol/L (ref 135–145)

## 2023-10-26 LAB — CBC
HCT: 32.5 % — ABNORMAL LOW (ref 36.0–46.0)
Hemoglobin: 10.4 g/dL — ABNORMAL LOW (ref 12.0–15.0)
MCH: 32.3 pg (ref 26.0–34.0)
MCHC: 32 g/dL (ref 30.0–36.0)
MCV: 100.9 fL — ABNORMAL HIGH (ref 80.0–100.0)
Platelets: 154 10*3/uL (ref 150–400)
RBC: 3.22 MIL/uL — ABNORMAL LOW (ref 3.87–5.11)
RDW: 16.2 % — ABNORMAL HIGH (ref 11.5–15.5)
WBC: 7.4 10*3/uL (ref 4.0–10.5)
nRBC: 0 % (ref 0.0–0.2)

## 2023-10-26 SURGERY — EGD (ESOPHAGOGASTRODUODENOSCOPY)
Anesthesia: Monitor Anesthesia Care

## 2023-10-26 MED ORDER — POTASSIUM CHLORIDE 10 MEQ/100ML IV SOLN
10.0000 meq | INTRAVENOUS | Status: AC
  Administered 2023-10-26 (×4): 10 meq via INTRAVENOUS
  Filled 2023-10-26 (×4): qty 100

## 2023-10-26 MED ORDER — AMOXICILLIN-POT CLAVULANATE 875-125 MG PO TABS
1.0000 | ORAL_TABLET | Freq: Two times a day (BID) | ORAL | Status: DC
  Administered 2023-10-26 – 2023-10-27 (×2): 1 via ORAL
  Filled 2023-10-26 (×2): qty 1

## 2023-10-26 MED ORDER — PROPOFOL 500 MG/50ML IV EMUL
INTRAVENOUS | Status: DC | PRN
  Administered 2023-10-26: 100 ug/kg/min via INTRAVENOUS

## 2023-10-26 MED ORDER — PHENYLEPHRINE 80 MCG/ML (10ML) SYRINGE FOR IV PUSH (FOR BLOOD PRESSURE SUPPORT)
PREFILLED_SYRINGE | INTRAVENOUS | Status: DC | PRN
  Administered 2023-10-26: 160 ug via INTRAVENOUS
  Administered 2023-10-26: 240 ug via INTRAVENOUS
  Administered 2023-10-26: 160 ug via INTRAVENOUS

## 2023-10-26 MED ORDER — PROPOFOL 10 MG/ML IV BOLUS
INTRAVENOUS | Status: DC | PRN
  Administered 2023-10-26: 20 mg via INTRAVENOUS

## 2023-10-26 MED ORDER — LACTATED RINGERS IV SOLN: INTRAVENOUS | Status: DC | PRN

## 2023-10-26 MED ORDER — EPHEDRINE SULFATE-NACL 50-0.9 MG/10ML-% IV SOSY
PREFILLED_SYRINGE | INTRAVENOUS | Status: DC | PRN
  Administered 2023-10-26 (×2): 5 mg via INTRAVENOUS

## 2023-10-26 NOTE — Transfer of Care (Signed)
 Immediate Anesthesia Transfer of Care Note  Patient: Dana Duncan Hinderer  Procedure(s) Performed: EGD (ESOPHAGOGASTRODUODENOSCOPY) COLONOSCOPY POLYPECTOMY, INTESTINE CONTROL OF HEMORRHAGE, GI TRACT, ENDOSCOPIC, BY CLIPPING OR OVERSEWING BIOPSY, GI  Patient Location: PACU  Anesthesia Type:MAC  Level of Consciousness: drowsy and patient cooperative  Airway & Oxygen  Therapy: Patient Spontanous Breathing and Patient connected to nasal cannula oxygen   Post-op Assessment: Report given to RN and Post -op Vital signs reviewed and stable  Post vital signs: Reviewed and stable  Last Vitals:  Vitals Value Taken Time  BP 113/40   Temp    Pulse 106 10/26/23 1411  Resp 23 10/26/23 1411  SpO2 94 % 10/26/23 1411  Vitals shown include unfiled device data.  Last Pain:  Vitals:   10/26/23 1318  TempSrc:   PainSc: 0-No pain         Complications: No notable events documented.

## 2023-10-26 NOTE — Plan of Care (Signed)

## 2023-10-26 NOTE — Progress Notes (Signed)
   10/26/23 0902  TOC Brief Assessment  Insurance and Status Reviewed  Patient has primary care physician Yes  Home environment has been reviewed Home with spouse  Prior level of function: Independent  Prior/Current Home Services No current home services  Social Drivers of Health Review SDOH reviewed no interventions necessary  Readmission risk has been reviewed Yes  Transition of care needs no transition of care needs at this time

## 2023-10-26 NOTE — Progress Notes (Signed)
 Pt identified as possible discharge tomorrow. Called patients [facility] to [facility]. Pt husband stated that they would need ambulance to transport Pt back to ALF. TOC and primary RN made aware.

## 2023-10-26 NOTE — Progress Notes (Signed)
  Progress Note   Patient: [name] QMV:[id number] DOB: 10-Aug-1940 DOA: 10/24/2023     0 DOS: the patient was seen and examined on 10/26/2023   Brief hospital course: 83 year old woman complex PMH with recurrent GI bleed and multiple investigations in the past, who presented with melena.  Seen by gastroenterology with plan for upper and lower endoscopy 4/27.  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  None reported here.  No hematemesis. EGD unremarkable. Bleeding from likely right-sided segmental/ischemic colitis.  Augmentin  for 2 weeks per [facility] diet.  No further inpatient GI workup planned.  Home tomorrow if hemoglobin stable   Chronic diastolic CHF Last echo done in November 2022 showing EF 60 to 65%, mild mitral regurgitation, mild aortic regurgitation.  Appears stable.  BNP within normal limits. Resume torsemide   Essential hypertension Blood pressure stable.   Hyperlipidemia RLS COPD Resume bronchodilators Anxiety/depression Resume Zoloft  Overactive bladder Resume oxybutynin  Glaucoma Resume home medications       Subjective:  Feels ok today  Physical Exam: Vitals:   10/26/23 0410 10/26/23 1412 10/26/23 1420 10/26/23 1437  BP: (!) 126/50 (!) 113/40 (!) 131/55 130/63  Pulse: 78 (!) 106 (!) 116 96  Resp: 16 (!) 23 18 16   Temp: 98 F (36.7 C) 98 F (36.7 C)  97.7 F (36.5 C)  TempSrc: Oral Temporal  Oral  SpO2: 91% 94% 94% 98%  Weight:      Height:       Physical Exam Vitals reviewed.  Constitutional:      General: She is not in acute distress.    Appearance: She is not ill-appearing or toxic-appearing.  Cardiovascular:     Rate and Rhythm: Normal rate and regular  rhythm.     Heart sounds: No murmur heard. Pulmonary:     Effort: No respiratory distress.     Breath sounds: No wheezing, rhonchi or rales.  Neurological:     Mental Status: She is alert.  Psychiatric:        Mood and Affect: Mood normal.        Behavior: Behavior normal.     Data Reviewed: K+ 3.4 Hgb stable 10.4  Family Communication: none  Disposition: Status is: Observation      Time spent: 20 minutes  Author: Jerline Moon, MD 10/26/2023 4:46 PM  For on call review [email or web address].

## 2023-10-26 NOTE — Op Note (Signed)
 [facility] Patient Name: [name] Procedure Date: 10/26/2023 MRN: [id number] Attending MD: Felecia Hopper , MD, [id number] Date of Birth: May 24, 1941 CSN: [id number] Age: 83 Admit Type: Inpatient Procedure:                Upper GI endoscopy Indications:              Melena Providers:                Felecia Hopper, MD, Perla Bradford, RN, Joline Ned,                            Technician Referring MD:              Medicines:                Sedation Administered by an Anesthesia Professional Complications:            No immediate complications. Estimated Blood Loss:     Estimated blood loss was minimal. Procedure:                Pre-Anesthesia Assessment:                           - Prior to the procedure, a History and Physical                            was performed, and patient medications and                            allergies were reviewed. The patient's tolerance of                            previous anesthesia was also reviewed. The risks                            and benefits of the procedure and the sedation                            options and risks were discussed with the patient.                            All questions were answered, and informed consent                            was obtained. Prior Anticoagulants: The patient has                            taken no anticoagulant or antiplatelet agents. ASA                            Grade Assessment: III - A patient with severe                            systemic disease. After reviewing the risks and  benefits, the patient was deemed in satisfactory                            condition to undergo the procedure.                           After obtaining informed consent, the endoscope was                            passed under direct vision. Throughout the                            procedure, the patient's blood pressure, pulse, and                            oxygen   saturations were monitored continuously. The                            [id number]-[id number] ([id number]) Olympus endoscope was introduced                            through the mouth, and advanced to the second part                            of duodenum. The upper GI endoscopy was                            accomplished without difficulty. The patient                            tolerated the procedure well. Scope In: Scope Out: Findings:      The Z-line was regular and was found 38 cm from the incisors.      Scattered mild inflammation characterized by congestion (edema) and       erythema was found in the gastric antrum and in the prepyloric region of       the stomach.      Multiple small sessile polyps were found in the gastric body.      The cardia and gastric fundus were normal on retroflexion.      The duodenal bulb, first portion of the duodenum and second portion of       the duodenum were normal. Impression:               - Z-line regular, 38 cm from the incisors.                           - Gastritis.                           - Multiple gastric polyps.                           - Normal duodenal bulb, first portion of the                            duodenum  and second portion of the duodenum.                           - No specimens collected. Moderate Sedation:      Moderate (conscious) sedation was personally administered by an       anesthesia professional. The following parameters were monitored: oxygen        saturation, heart rate, blood pressure, and response to care. Recommendation:           - Perform a colonoscopy today. Procedure Code(s):        --- Professional ---                           [phone], Esophagogastroduodenoscopy, flexible,                            transoral; diagnostic, including collection of                            specimen(s) by brushing or washing, when performed                            (separate procedure) Diagnosis Code(s):        --- Professional  ---                           K29.70, Gastritis, unspecified, without bleeding                           K31.7, Polyp of stomach and duodenum                           K92.1, Melena (includes Hematochezia) CPT copyright 2022 American [employer]. All rights reserved. The codes documented in this report are preliminary and upon coder review may  be revised to meet current compliance requirements. Felecia Hopper, MD Felecia Hopper, MD 10/26/2023 2:18:45 PM Number of Addenda: 0

## 2023-10-26 NOTE — Interval H&P Note (Signed)
 History and Physical Interval Note:  10/26/2023 1:19 PM  [name]  has presented today for surgery, with the diagnosis of GI bleed, history of large ascending colon polyp.  The various methods of treatment have been discussed with the patient and family. After consideration of risks, benefits and other options for treatment, the patient has consented to  Procedure(s): EGD (ESOPHAGOGASTRODUODENOSCOPY) (N/A) COLONOSCOPY (N/A) as a surgical intervention.  The patient's history has been reviewed, patient examined, no change in status, stable for surgery.  I have reviewed the patient's chart and labs.  Questions were answered to the patient's satisfaction.     [name]

## 2023-10-26 NOTE — Anesthesia Postprocedure Evaluation (Signed)
 Anesthesia Post Note  Patient: [name]  Procedure(s) Performed: EGD (ESOPHAGOGASTRODUODENOSCOPY) COLONOSCOPY POLYPECTOMY, INTESTINE CONTROL OF HEMORRHAGE, GI TRACT, ENDOSCOPIC, BY CLIPPING OR OVERSEWING BIOPSY, GI     Patient location during evaluation: PACU Anesthesia Type: MAC Level of consciousness: awake and alert Pain management: pain level controlled Vital Signs Assessment: post-procedure vital signs reviewed and stable Respiratory status: spontaneous breathing, nonlabored ventilation, respiratory function stable and patient connected to nasal cannula oxygen  Cardiovascular status: blood pressure returned to baseline and stable Postop Assessment: no apparent nausea or vomiting Anesthetic complications: no   No notable events documented.  Last Vitals:  Vitals:   10/26/23 1420 10/26/23 1437  BP: (!) 131/55 130/63  Pulse: (!) 116 96  Resp: 18 16  Temp:  36.5 C  SpO2: 94% 98%    Last Pain:  Vitals:   10/26/23 1437  TempSrc: Oral  PainSc:                  [name]

## 2023-10-26 NOTE — Anesthesia Preprocedure Evaluation (Signed)
 Anesthesia Evaluation  Patient identified by MRN, date of birth, ID band Patient awake    Reviewed: Allergy & Precautions, NPO status , Patient's Chart, lab work & pertinent test results, reviewed documented beta blocker date and time   History of Anesthesia Complications Negative for: history of anesthetic complications  Airway Mallampati: II  TM Distance: >3 FB     Dental  (+) Missing, Partial Upper, Partial Lower,    Pulmonary COPD, former smoker, neg PE   breath sounds clear to auscultation       Cardiovascular hypertension, pulmonary hypertension (moderate)+ DOE  (-) Past MI and (-) Cardiac Stents  Rhythm:Regular Rate:Normal     Neuro/Psych  Headaches PSYCHIATRIC DISORDERS Anxiety Depression       GI/Hepatic ,GERD  ,,(+) neg Cirrhosis        Endo/Other    Renal/GU Renal disease     Musculoskeletal  (+) Arthritis ,    Abdominal   Peds  Hematology  (+) Blood dyscrasia, anemia   Anesthesia Other Findings   Reproductive/Obstetrics                             Anesthesia Physical Anesthesia Plan  ASA: 3  Anesthesia Plan: MAC   Post-op Pain Management:    Induction: Intravenous  PONV Risk Score and Plan: 2 and Ondansetron  and Propofol  infusion  Airway Management Planned: Natural Airway and Simple Face Mask  Additional Equipment:   Intra-op Plan:   Post-operative Plan:   Informed Consent: I have reviewed the patients History and Physical, chart, labs and discussed the procedure including the risks, benefits and alternatives for the proposed anesthesia with the patient or authorized representative who has indicated his/her understanding and acceptance.     Dental advisory given  Plan Discussed with: CRNA  Anesthesia Plan Comments:        Anesthesia Quick Evaluation

## 2023-10-26 NOTE — Op Note (Signed)
 [facility] Patient Name: [name] Procedure Date: 10/26/2023 MRN: [id number] Attending MD: Felecia Hopper , MD, [id number] Date of Birth: 10-11-1940 CSN: [id number] Age: 83 Admit Type: Inpatient Procedure:                Colonoscopy Indications:              Therapeutic procedure for colon polyps, Hematochezia Providers:                Felecia Hopper, MD, Perla Bradford, RN, Joline Ned,                            Technician Referring MD:              Medicines:                Sedation Administered by an Anesthesia Professional Complications:            No immediate complications. Estimated Blood Loss:     Estimated blood loss was minimal. Estimated blood                            loss was minimal. Procedure:                Pre-Anesthesia Assessment:                           - Prior to the procedure, a History and Physical                            was performed, and patient medications and                            allergies were reviewed. The patient's tolerance of                            previous anesthesia was also reviewed. The risks                            and benefits of the procedure and the sedation                            options and risks were discussed with the patient.                            All questions were answered, and informed consent                            was obtained. Prior Anticoagulants: The patient has                            taken no anticoagulant or antiplatelet agents. ASA                            Grade Assessment: III - A patient with severe  systemic disease. After reviewing the risks and                            benefits, the patient was deemed in satisfactory                            condition to undergo the procedure.                           After obtaining informed consent, the colonoscope                            was passed under direct vision. Throughout the                             procedure, the patient's blood pressure, pulse, and                            oxygen  saturations were monitored continuously. The                            PCF-HQ190L ([id number]) Olympus colonoscope was                            introduced through the anus and advanced to the the                            terminal ileum, with identification of the                            appendiceal orifice and IC valve. The colonoscopy                            was performed without difficulty. The patient                            tolerated the procedure well. The quality of the                            bowel preparation was adequate to identify polyps                            greater than 5 mm in size. The terminal ileum,                            ileocecal valve, appendiceal orifice, and rectum                            were photographed. Scope In: 1:41:19 PM Scope Out: 2:03:20 PM Scope Withdrawal Time: 0 hours 16 minutes 42 seconds  Total Procedure Duration: 0 hours 22 minutes 1 second  Findings:      Hemorrhoids were found on perianal exam.      The terminal ileum appeared normal.      Segmental moderate inflammation  characterized by congestion (edema),       erythema, loss of vascularity and mucus was found in the proximal       ascending colon and in the cecum. Biopsies were taken with a cold       forceps for histology.      A 20 mm polyp was found in the proximal ascending colon. The polyp was       sessile. The polyp was removed with a piecemeal technique using a hot       snare. Resection and retrieval were complete. To close a defect after       polypectomy, one hemostatic clip was successfully placed. There was no       bleeding at the end of the procedure.      An 8 mm polyp was found in the hepatic flexure. The polyp was sessile.       The polyp was removed with a hot snare. Resection and retrieval were       complete.      Multiple large-mouthed and  small-mouthed diverticula were found in the       entire colon.      Internal hemorrhoids were found during retroflexion. The hemorrhoids       were medium-sized. Impression:               - Hemorrhoids found on perianal exam.                           - The examined portion of the ileum was normal.                           - Segmental moderate inflammation was found in the                            proximal ascending colon and in the cecum secondary                            to ischemic colitis. Biopsied.                           - One 20 mm polyp in the proximal ascending colon,                            removed piecemeal using a hot snare. Resected and                            retrieved. Clip was placed.                           - One 8 mm polyp at the hepatic flexure, removed                            with a hot snare. Resected and retrieved.                           - Diverticulosis in the entire examined colon.                           -  Internal hemorrhoids. Moderate Sedation:      Moderate (conscious) sedation was personally administered by an       anesthesia professional. The following parameters were monitored: oxygen        saturation, heart rate, blood pressure, and response to care. Recommendation:           - Return patient to hospital ward for ongoing care.                           - Soft diet.                           - Continue present medications.                           - Await pathology results. Procedure Code(s):        --- Professional ---                           [phone], Colonoscopy, flexible; with removal of                            tumor(s), polyp(s), or other lesion(s) by snare                            technique                           45380, 59, Colonoscopy, flexible; with biopsy,                            single or multiple Diagnosis Code(s):        --- Professional ---                           K64.8, Other hemorrhoids                            K55.9, Vascular disorder of intestine, unspecified                           D12.2, Benign neoplasm of ascending colon                           D12.3, Benign neoplasm of transverse colon (hepatic                            flexure or splenic flexure)                           K63.5, Polyp of colon                           K92.1, Melena (includes Hematochezia)                           K57.30, Diverticulosis of large intestine without  perforation or abscess without bleeding CPT copyright 2022 American [employer] Association. All rights reserved. The codes documented in this report are preliminary and upon coder review may  be revised to meet current compliance requirements. Felecia Hopper, MD Felecia Hopper, MD 10/26/2023 2:25:51 PM Number of Addenda: 0

## 2023-10-26 NOTE — Progress Notes (Signed)
 Mobility Specialist - Progress Note   10/26/23 1128  Mobility  Activity Ambulated with assistance in hallway  Level of Assistance Standby assist, set-up cues, supervision of patient - no hands on  Assistive Device Front wheel walker  Distance Ambulated (ft) 100 ft  Range of Motion/Exercises Active  Activity Response Tolerated well  Mobility Referral Yes  Mobility visit 1 Mobility  Mobility Specialist Start Time (ACUTE ONLY) 1115  Mobility Specialist Stop Time (ACUTE ONLY) 1128  Mobility Specialist Time Calculation (min) (ACUTE ONLY) 13 min   Pt was found on recliner chair and agreeable to ambulate. Grew fatigued with session and returned to recliner chair with all needs met. Call bell in reach and chair alarm on.  Lorna Rose Mobility Specialist

## 2023-10-26 NOTE — Brief Op Note (Signed)
 10/26/2023  2:06 PM  PATIENT:  [name]  83 y.o. female  PRE-OPERATIVE DIAGNOSIS:  GI bleed, history of large ascending colon polyp  POST-OPERATIVE DIAGNOSIS:  Ascending, hepatic flexure Colon Polyp Cecum Biopsy r/o Colitis Hemorrhoids  PROCEDURE:  Procedure(s): EGD (ESOPHAGOGASTRODUODENOSCOPY) (N/A) COLONOSCOPY (N/A) POLYPECTOMY, INTESTINE CONTROL OF HEMORRHAGE, GI TRACT, ENDOSCOPIC, BY CLIPPING OR OVERSEWING BIOPSY, GI  SURGEON:  Surgeons and Role:    * Dardan Shelton, MD - Primary  Findings ----------- - EGD showed mild gastritis and few likely fundic gland gastric polyps. - Colonoscopy showed evidence of likely ischemic colitis in the cecum and small area of proximal ascending colon.  Biopsy performed.  She had 20 mm proximal ascending colon which was removed with a hot snare.  1 clip placed.  Another small polyp at hepatic flexure removed with hot snare.  Diverticulosis and hemorrhoids.  Recommendations ------------------------- - Bleeding from likely right-sided segmental/ischemic colitis. - Recommend antibiotics /Augmentin  for 2 weeks - Advance diet. - No further inpatient GI workup planned.  GI will sign off.  Hopefully discharge tomorrow if hemoglobin stable.  Felecia Hopper MD, FACP 10/26/2023, 2:10 PM  Contact #  [phone]

## 2023-10-27 ENCOUNTER — Encounter: Payer: Self-pay | Admitting: Orthopedic Surgery

## 2023-10-27 ENCOUNTER — Non-Acute Institutional Stay: Payer: Self-pay | Admitting: Orthopedic Surgery

## 2023-10-27 DIAGNOSIS — K559 Vascular disorder of intestine, unspecified: Secondary | ICD-10-CM | POA: Diagnosis not present

## 2023-10-27 DIAGNOSIS — I5032 Chronic diastolic (congestive) heart failure: Secondary | ICD-10-CM | POA: Diagnosis not present

## 2023-10-27 DIAGNOSIS — I10 Essential (primary) hypertension: Secondary | ICD-10-CM | POA: Diagnosis not present

## 2023-10-27 DIAGNOSIS — Z7401 Bed confinement status: Secondary | ICD-10-CM | POA: Diagnosis not present

## 2023-10-27 DIAGNOSIS — D500 Iron deficiency anemia secondary to blood loss (chronic): Secondary | ICD-10-CM | POA: Diagnosis not present

## 2023-10-27 DIAGNOSIS — K922 Gastrointestinal hemorrhage, unspecified: Secondary | ICD-10-CM | POA: Diagnosis not present

## 2023-10-27 DIAGNOSIS — K921 Melena: Secondary | ICD-10-CM | POA: Diagnosis not present

## 2023-10-27 DIAGNOSIS — F418 Other specified anxiety disorders: Secondary | ICD-10-CM | POA: Diagnosis not present

## 2023-10-27 DIAGNOSIS — R58 Hemorrhage, not elsewhere classified: Secondary | ICD-10-CM | POA: Diagnosis not present

## 2023-10-27 DIAGNOSIS — Z743 Need for continuous supervision: Secondary | ICD-10-CM | POA: Diagnosis not present

## 2023-10-27 DIAGNOSIS — R531 Weakness: Secondary | ICD-10-CM | POA: Diagnosis not present

## 2023-10-27 DIAGNOSIS — J449 Chronic obstructive pulmonary disease, unspecified: Secondary | ICD-10-CM

## 2023-10-27 DIAGNOSIS — N3281 Overactive bladder: Secondary | ICD-10-CM

## 2023-10-27 LAB — CBC
HCT: 30.4 % — ABNORMAL LOW (ref 36.0–46.0)
Hemoglobin: 9.4 g/dL — ABNORMAL LOW (ref 12.0–15.0)
MCH: 30.9 pg (ref 26.0–34.0)
MCHC: 30.9 g/dL (ref 30.0–36.0)
MCV: 100 fL (ref 80.0–100.0)
Platelets: 152 10*3/uL (ref 150–400)
RBC: 3.04 MIL/uL — ABNORMAL LOW (ref 3.87–5.11)
RDW: 16.6 % — ABNORMAL HIGH (ref 11.5–15.5)
WBC: 9.3 10*3/uL (ref 4.0–10.5)
nRBC: 0 % (ref 0.0–0.2)

## 2023-10-27 MED ORDER — AMOXICILLIN-POT CLAVULANATE 875-125 MG PO TABS: 1.0000 | ORAL_TABLET | Freq: Two times a day (BID) | ORAL | 0 refills | Status: DC

## 2023-10-27 NOTE — NC FL2 (Signed)
 [place]  MEDICAID [id number] LEVEL OF CARE FORM     IDENTIFICATION  Patient Name: [name] Birthdate: 07-28-1940 Sex: female Admission Date (Current Location): 10/24/2023  [facility] and [place] Number:  Producer, television/film/video and Address:  [facility],  [address], [place] [address]      Provider Number: [id number]  Attending Physician Name and Address:  Lonita Roach, MD  Relative Name and Phone Number:       Current Level of Care: Hospital Recommended Level of Care: Assisted Living Facility Prior Approval Number:    Date Approved/Denied:   PASRR Number:    Discharge Plan: Other (Comment) (ALF)    Current Diagnoses: Patient Active Problem List   Diagnosis Date Noted   Ischemic colitis (HCC) 10/27/2023   Acute upper GI bleed 10/25/2023   Edema of right lower extremity 10/25/2023   Chronic heart failure with preserved ejection fraction (HFpEF) (HCC) 10/25/2023   UGI bleed 04/19/2023   Recurrent depression (HCC) 04/13/2023   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   History of GI diverticular bleed 03/28/2023   GAD (generalized anxiety disorder) 03/28/2023   Diastolic heart failure (HCC) 03/19/2023   Class 1 obesity with body mass index (BMI) of 34.0 to 34.9 in adult 03/19/2023   Thoracic aorta atherosclerosis (HCC) 03/19/2023   Melena 01/05/2023   Hypotension 05/02/2022   UTI (urinary tract infection) 02/05/2022   Restless leg syndrome 02/05/2022   Senile osteoporosis 02/05/2022   Hypertension 02/05/2022   Pulmonary nodule 1 cm or greater in diameter 02/05/2022   GERD (gastroesophageal reflux disease) 02/05/2022   Upper GI bleed 01/07/2022   Iron  deficiency anemia 05/02/2021   Chronic anemia    DOE (dyspnea on exertion) 11/18/2017   COPD (chronic obstructive pulmonary disease) (HCC) 11/18/2017   Diverticulosis of intestine with bleeding 09/05/2017   Acute respiratory failure with hypoxia (HCC) 08/07/2017   Depression with  anxiety 08/07/2017   Hyperlipidemia 08/07/2017   Macular degeneration 08/07/2017   Glaucoma 08/07/2017   Weakness 08/07/2017   Dehydration 08/07/2017   Lower GI bleeding 10/14/2016   Diverticulosis of colon 10/14/2016   Anxiety 10/14/2016   Lower GI bleed 10/14/2016   GI bleed    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   Dermatochalasis of both upper eyelids 08/17/2014   Osteoarthritis, multiple sites 12/20/2011    Orientation RESPIRATION BLADDER Height & Weight     Self, Place, Time  Normal Continent Weight: 170 lb 3.1 oz (77.2 kg) Height:  5\' 1"  (154.9 cm)  BEHAVIORAL SYMPTOMS/MOOD NEUROLOGICAL BOWEL NUTRITION STATUS      Continent Diet (soft)  AMBULATORY STATUS COMMUNICATION OF NEEDS Skin   Supervision Verbally Normal                       Personal Care Assistance Level of Assistance  Bathing, Feeding, Dressing Bathing Assistance: Limited assistance Feeding assistance: Independent Dressing Assistance: Limited assistance     Functional Limitations Info  Sight, Hearing, Speech Sight Info: Impaired (glasses) Hearing Info: Adequate Speech Info: Adequate    SPECIAL CARE FACTORS FREQUENCY                       Contractures Contractures Info: Not present    Additional Factors Info  Code Status, Allergies Code Status Info: DNR Allergies Info: Morphine And Codeine  Aspirin  Duloxetine  Hcl  Tymlos (Abaloparatide)  Ventolin  (Albuterol )  Cefadroxi  Current Medications (10/27/2023):  This is the current hospital active medication list Current Facility-Administered Medications  Medication Dose Route Frequency Provider Last Rate Last Admin   acetaminophen  (TYLENOL ) tablet 650 mg  650 mg Oral Q6H PRN Juliette Oh, MD       Or   acetaminophen  (TYLENOL ) suppository 650 mg  650 mg Rectal Q6H PRN Juliette Oh, MD       albuterol  (PROVENTIL ) (2.5 MG/3ML) 0.083% nebulizer solution 3 mL  3 mL Inhalation Q4H PRN Lonita Roach,  MD       amoxicillin -clavulanate (AUGMENTIN ) 875-125 MG per tablet 1 tablet  1 tablet Oral Q12H Lonita Roach, MD   1 tablet at 10/27/23 0900   timolol  (TIMOPTIC ) 0.5 % ophthalmic solution 1 drop  1 drop Both Eyes BID Lonita Roach, MD   1 drop at 10/27/23 0900   And   dorzolamide  (TRUSOPT ) 2 % ophthalmic solution 1 drop  1 drop Both Eyes BID Lonita Roach, MD   1 drop at 10/27/23 0900   latanoprost  (XALATAN ) 0.005 % ophthalmic solution 1 drop  1 drop Both Eyes QHS Lonita Roach, MD   1 drop at 10/26/23 2223   Netarsudil  Dimesylate 0.02 % SOLN 1 drop  1 drop Both Eyes QHS Lonita Roach, MD       oxybutynin  (DITROPAN -XL) 24 hr tablet 10 mg  10 mg Oral QHS Lonita Roach, MD   10 mg at 10/26/23 2222   pantoprazole  (PROTONIX ) injection 40 mg  40 mg Intravenous Q12H Rathore, Vasundhra, MD   40 mg at 10/27/23 0900   sertraline  (ZOLOFT ) tablet 75 mg  75 mg Oral Daily Lonita Roach, MD   75 mg at 10/27/23 0900   torsemide (DEMADEX) tablet 20 mg  20 mg Oral Daily Lonita Roach, MD   20 mg at 10/27/23 0900   umeclidinium-vilanterol (ANORO ELLIPTA ) 62.5-25 MCG/ACT 1 puff  1 puff Inhalation q morning Lonita Roach, MD   1 puff at 10/27/23 0825     Discharge Medications: Please see discharge summary for a list of discharge medications.  Relevant Imaging Results:  Relevant Lab Results:   Additional Information SSN:[id number]  Arta Lark Shanaye Rief, LCSW

## 2023-10-27 NOTE — Progress Notes (Signed)
 Location:  [facility] Room Number: 20/A Place of Service:  ALF [phone]) Provider:  Arnetha Bhat, NP   Arnetha Bhat, NP  Patient Care Team: Arnetha Bhat, NP as PCP - General (Adult Health Nurse Practitioner) Marguerite Shiley, MD as Consulting Physician (Internal Medicine)  Extended Emergency Contact Information Primary Emergency Contact: Speciale,[name] Address: [address]          [place], [place] [address] [place] Home Phone: [phone] Mobile Phone: [phone] Relation: Spouse Secondary Emergency Contact: Rison MD,[name] Tracey Friday Phone: [phone] Relation: Son Interpreter needed? No  Code Status:  DNR Goals of care: Advanced Directive information    10/26/2023    1:17 PM  Advanced Directives  Does Patient Have a Medical Advance Directive? Yes  Pre-existing out of facility DNR order (yellow form or pink MOST form) Pink MOST form placed in chart (order not valid for inpatient use)     Chief Complaint  Patient presents with   Hospitalization Follow-up    HPI:  Pt is a 83 y.o. female seen today for f/u s/p hospitalization 04/25-04/28.   She currently resides on the assisted living unit at [facility]. PMH: HTN, COPD, pulmonary nodule, Diverticulosis with GI bleed, GERD, OA, osteoporosis,macular degeneration, iron  def anemia, unstable gait, urinary incontinence, anxiety, and depression.   "H/o diverticular bleed 11/2020 s/p embolization with recurrence. Multiple hospitalizations due to bloody stools. Colonoscopy 2023 noted blood in colon and diffuse diverticulosis. Past EGD noted gastric polyps, duodenal bulb and gastritis." Remains on Protonix .   04/25 she had large amount of dark bloody stools after dinner. She denied abdominal pain or dizziness. On call provider recommended ED evaluation.   In the ED, vitals stable. Hgb 11.4, d-dimer 0.32. 04/26 hgb 9.9. GI consulted and recommended EGD/colonoscopy. 04/27 EGD noted  multiple gastric polyps, colonoscopy noted hemorrhoids, segmental inflammation proximal ascending colon, cecum secondary to ischemic colitis, 20 mm polyp. 2 week course of Augmentin  recommended. No bleeding episodes while hospitalized. She tolerated advanced diet after procedure. Hgb was 9.4 at discharge. Upon return, she had another episode of rectal bleeding. Asymptomatic. Vitals stable.   Mild swelling to lower extremities. BNP was 46. 05/2021 echo LVEF 60-65% with mild mitral and aortic regurgitation.     Past Medical History:  Diagnosis Date   Anxiety    Arthritis    "bilateral knees, shoulders, elbows; neck, pretty widespread" (09/05/2017)   BPPV (benign paroxysmal positional vertigo)    Depression    GERD (gastroesophageal reflux disease)    Glaucoma, both eyes    Headache    "probably 2/month" (09/05/2017)   History of blood transfusion ~ 2008   "related to LGIB"   Hyperlipemia    Lower GI bleeding ~ 2008; 09/05/2017   "had to have blood transfusion"   Macular degeneration, bilateral    Osteopenia    Seasonal allergies    Skin cancer, basal cell 2001   "off my nose, left side"   Sleeping excessive    Tinnitus of both ears    Past Surgical History:  Procedure Laterality Date   BALLOON DILATION N/A 05/08/2021   Procedure: BALLOON DILATION;  Surgeon: Genell Ken, MD;  Location: [facility] ENDOSCOPY;  Service: Gastroenterology;  Laterality: N/A;   BASAL CELL CARCINOMA EXCISION  2001   "off my nose, left side"   BIOPSY  05/08/2021   Procedure: BIOPSY;  Surgeon: Genell Ken, MD;  Location: [facility] ENDOSCOPY;  Service: Gastroenterology;;   BIOPSY  04/25/2023  Procedure: BIOPSY;  Surgeon: Baldo Bonds, MD;  Location: [facility] ENDOSCOPY;  Service: Gastroenterology;;   BLEPHAROPLASTY Bilateral    CATARACT EXTRACTION W/ INTRAOCULAR LENS  IMPLANT, BILATERAL Bilateral 1990's   COLONOSCOPY WITH PROPOFOL  N/A 05/04/2022   Procedure: COLONOSCOPY WITH PROPOFOL ;  Surgeon: Genell Ken, MD;  Location: [facility]  ENDOSCOPY;  Service: Gastroenterology;  Laterality: N/A;   COLONOSCOPY WITH PROPOFOL  N/A 04/25/2023   Procedure: COLONOSCOPY WITH PROPOFOL ;  Surgeon: Baldo Bonds, MD;  Location: [facility] ENDOSCOPY;  Service: Gastroenterology;  Laterality: N/A;   ESOPHAGOGASTRODUODENOSCOPY (EGD) WITH PROPOFOL  N/A 05/08/2021   Procedure: ESOPHAGOGASTRODUODENOSCOPY (EGD) WITH PROPOFOL ;  Surgeon: Genell Ken, MD;  Location: [facility] ENDOSCOPY;  Service: Gastroenterology;  Laterality: N/A;   ESOPHAGOGASTRODUODENOSCOPY (EGD) WITH PROPOFOL  N/A 01/11/2022   Procedure: ESOPHAGOGASTRODUODENOSCOPY (EGD) WITH PROPOFOL ;  Surgeon: Ozell Blunt, MD;  Location: [facility] ENDOSCOPY;  Service: Gastroenterology;  Laterality: N/A;   ESOPHAGOGASTRODUODENOSCOPY (EGD) WITH PROPOFOL  N/A 01/06/2023   Procedure: ESOPHAGOGASTRODUODENOSCOPY (EGD) WITH PROPOFOL ;  Surgeon: Ozell Blunt, MD;  Location: [facility] ENDOSCOPY;  Service: Gastroenterology;  Laterality: N/A;   ESOPHAGOGASTRODUODENOSCOPY (EGD) WITH PROPOFOL  N/A 04/21/2023   Procedure: ESOPHAGOGASTRODUODENOSCOPY (EGD) WITH PROPOFOL ;  Surgeon: Baldo Bonds, MD;  Location: [facility] ENDOSCOPY;  Service: Gastroenterology;  Laterality: N/A;   EYE SURGERY Bilateral    "to improve vision after cataract OR"   GIVENS CAPSULE STUDY N/A 04/21/2023   Procedure: GIVENS CAPSULE STUDY;  Surgeon: Baldo Bonds, MD;  Location: [facility] ENDOSCOPY;  Service: Gastroenterology;  Laterality: N/A;   IR ANGIOGRAM FOLLOW UP STUDY  12/19/2020   IR ANGIOGRAM SELECTIVE EACH ADDITIONAL VESSEL  12/19/2020   IR ANGIOGRAM SELECTIVE EACH ADDITIONAL VESSEL  12/19/2020   IR ANGIOGRAM VISCERAL SELECTIVE  12/19/2020   IR EMBO ART  VEN HEMORR LYMPH EXTRAV  INC GUIDE ROADMAPPING  12/19/2020   IR US  GUIDE VASC ACCESS RIGHT  12/19/2020   JOINT REPLACEMENT     POLYPECTOMY  05/04/2022   Procedure: POLYPECTOMY;  Surgeon: Genell Ken, MD;  Location: [facility] ENDOSCOPY;  Service: Gastroenterology;;   STAPEDES SURGERY Left    "scraped stapedes because it was sticking  when it wasn't suppose to"   TONSILLECTOMY AND ADENOIDECTOMY  1946   TOTAL KNEE ARTHROPLASTY Left ~ 2008   TOTAL KNEE ARTHROPLASTY  12/20/2011   Procedure: TOTAL KNEE ARTHROPLASTY;  Surgeon: Lorriane Rote, MD;  Location: [facility] OR;  Service: Orthopedics;  Laterality: Right;  Right Total Knee Arthroplasty   TUBAL LIGATION  1980's    Allergies  Allergen Reactions   Morphine And Codeine Itching   Aspirin Other (See Comments)    Unknown reaction   Duloxetine  Hcl Other (See Comments)    Unknown reaction   Tymlos [Abaloparatide] Other (See Comments)    Unknown reaction   Ventolin  [Albuterol ] Other (See Comments)    rapid heart beat   Cefadroxil Hives    Patient can take amoxicillin  and cipro     Outpatient Encounter Medications as of 10/27/2023  Medication Sig   acetaminophen  (TYLENOL ) 500 MG tablet Take 500 mg by mouth daily as needed for moderate pain (pain score 4-6).   acetaminophen  (TYLENOL ) 650 MG CR tablet Take 1,300 mg by mouth in the morning and at bedtime.   albuterol  (VENTOLIN  HFA) 108 (90 Base) MCG/ACT inhaler Inhale 2 puffs into the lungs every 4 (four) hours as needed for wheezing or shortness of breath.   alendronate (FOSAMAX) 70 MG tablet Take 70 mg by mouth every Sunday. Take with a full glass of water on an empty stomach.   amoxicillin -clavulanate (AUGMENTIN ) 875-125 MG  tablet Take 1 tablet by mouth every 12 (twelve) hours.   Ascorbic Acid  (VITAMIN C ) 1000 MG tablet Take 1,000 mg by mouth every morning.   cetirizine  (ZYRTEC ) 5 MG chewable tablet Chew 5 mg by mouth daily as needed for allergies.   Cholecalciferol  (VITAMIN D3) 50 MCG (2000 UT) TABS Take 2,000 Units by mouth every morning.   cyanocobalamin  (VITAMIN B12) 1000 MCG tablet Take 1,000 mcg by mouth daily.   docusate sodium  (COLACE) 100 MG capsule Take 200 mg by mouth at bedtime.   Dorzolamide  HCl-Timolol  Mal PF 2-0.5 % SOLN Place 1 drop into both eyes in the morning and at bedtime.   ferrous gluconate  (FERGON) 240  (27 FE) MG tablet Take 240 mg by mouth every morning. Take without food   fluticasone  (FLONASE ) 50 MCG/ACT nasal spray Place 1 spray into both nostrils daily as needed for allergies or rhinitis.   guaiFENesin  (ROBITUSSIN) 100 MG/5ML liquid Take 10 mLs by mouth every 4 (four) hours as needed for cough or to loosen phlegm.   latanoprost  (XALATAN ) 0.005 % ophthalmic solution Place 1 drop into both eyes at bedtime.   Multiple Vitamins-Minerals (PRESERVISION AREDS 2) CAPS Take 1 capsule by mouth 2 (two) times daily.   Netarsudil  Dimesylate (RHOPRESSA ) 0.02 % SOLN Place 1 drop into both eyes at bedtime.   oxybutynin  (DITROPAN -XL) 10 MG 24 hr tablet Take 10 mg by mouth at bedtime.   pantoprazole  (PROTONIX ) 40 MG tablet Take 40 mg by mouth 2 (two) times daily.   polyethylene glycol (MIRALAX  / GLYCOLAX ) 17 g packet Take 8.5 g by mouth daily as needed (constipation).   potassium chloride  SA (KLOR-CON  M) 20 MEQ tablet Take 20 mEq by mouth once.   Probiotic Product (ALIGN) 4 MG CAPS Take 4 mg by mouth at bedtime.   rOPINIRole  (REQUIP ) 0.5 MG tablet Take 1 tablet (0.5 mg total) by mouth daily as needed.   sertraline  (ZOLOFT ) 50 MG tablet Take 1.5 tablets (75 mg total) by mouth daily.   simvastatin  (ZOCOR ) 20 MG tablet Take 20 mg by mouth every evening.   Sodium Fluoride  (PREVIDENT 5000 BOOSTER PLUS) 1.1 % PSTE Place 1 Application onto teeth See admin instructions. Brush on teeth with a toothbrush after evening mouth care. Spit out excess and do not rinse.   torsemide (DEMADEX) 20 MG tablet Take 20 mg by mouth daily.   umeclidinium-vilanterol (ANORO ELLIPTA ) 62.5-25 MCG/ACT AEPB Inhale 1 puff into the lungs every morning.   zinc oxide 20 % ointment Apply 1 Application topically See admin instructions. Apply topically to buttocks after every incontinent episode and as needed for redness   Facility-Administered Encounter Medications as of 10/27/2023  Medication   acetaminophen  (TYLENOL ) tablet 650 mg   Or    acetaminophen  (TYLENOL ) suppository 650 mg   albuterol  (PROVENTIL ) (2.5 MG/3ML) 0.083% nebulizer solution 3 mL   amoxicillin -clavulanate (AUGMENTIN ) 875-125 MG per tablet 1 tablet   timolol  (TIMOPTIC ) 0.5 % ophthalmic solution 1 drop   And   dorzolamide  (TRUSOPT ) 2 % ophthalmic solution 1 drop   latanoprost  (XALATAN ) 0.005 % ophthalmic solution 1 drop   Netarsudil  Dimesylate 0.02 % SOLN 1 drop   oxybutynin  (DITROPAN -XL) 24 hr tablet 10 mg   pantoprazole  (PROTONIX ) injection 40 mg   sertraline  (ZOLOFT ) tablet 75 mg   torsemide (DEMADEX) tablet 20 mg   umeclidinium-vilanterol (ANORO ELLIPTA ) 62.5-25 MCG/ACT 1 puff    Review of Systems  Constitutional:  Positive for fatigue. Negative for fever.  HENT:  Negative for sore throat  and trouble swallowing.   Eyes:  Positive for visual disturbance.  Respiratory:  Negative for cough and shortness of breath.   Cardiovascular:  Negative for chest pain and leg swelling.    Immunization History  Administered Date(s) Administered   Fluad Quad(high Dose 65+) 04/24/2022   Fluad Trivalent(High Dose 65+) 04/27/2023   Influenza Inj Mdck Quad Pf 04/12/2017   Influenza, High Dose Seasonal PF 03/14/2018   Influenza, Quadrivalent, Recombinant, Inj, Pf 03/05/2019, 05/22/2021   PFIZER(Purple Top)SARS-COV-2 Vaccination 07/25/2019, 08/16/2019, 12/12/2021   Pneumococcal-Unspecified 03/02/2011   Tdap 03/12/2018   Zoster Recombinant(Shingrix) 10/08/2017, 12/09/2017   Pertinent  Health Maintenance Due  Topic Date Due   DEXA SCAN  Never done   INFLUENZA VACCINE  01/30/2024      10/30/2022   12:44 PM 01/22/2023    1:15 PM 04/04/2023   11:00 AM 07/10/2023    9:02 AM 10/24/2023    2:27 PM  Fall Risk  Falls in the past year? 0 0 0 0 0  Was there an injury with Fall? 0 0 0 0 0  Fall Risk Category Calculator 0 0 0 0 0  Patient at Risk for Falls Due to History of fall(s);Impaired balance/gait;Impaired mobility History of fall(s);Impaired balance/gait History of  fall(s);Impaired balance/gait;Impaired mobility History of fall(s);Impaired balance/gait;Impaired mobility History of fall(s);Impaired balance/gait;Impaired vision  Fall risk Follow up Falls evaluation completed;Education provided;Falls prevention discussed Falls evaluation completed;Education provided;Falls prevention discussed Falls evaluation completed;Education provided;Falls prevention discussed Falls evaluation completed;Education provided;Falls prevention discussed Falls evaluation completed;Education provided   Functional Status Survey:    Vitals:   10/27/23 1548  BP: (!) 105/58  Pulse: (!) 112  Resp: (!) 22  Temp: 98.2 F (36.8 C)  SpO2: 98%  Weight: 170 lb (77.1 kg)  Height: 5\' 1"  (1.549 m)   Body mass index is 32.12 kg/m. Physical Exam Vitals reviewed.  Constitutional:      General: She is not in acute distress. HENT:     Head: Normocephalic.  Eyes:     General:        Right eye: No discharge.        Left eye: No discharge.  Cardiovascular:     Rate and Rhythm: Normal rate and regular rhythm.     Pulses: Normal pulses.     Heart sounds: Normal heart sounds.  Pulmonary:     Effort: Pulmonary effort is normal.     Breath sounds: Normal breath sounds.  Abdominal:     General: Bowel sounds are normal. There is no distension.     Palpations: Abdomen is soft.  Musculoskeletal:     Cervical back: Neck supple.     Right lower leg: Edema present.     Left lower leg: Edema present.     Comments: Non pitting  Skin:    General: Skin is warm.     Capillary Refill: Capillary refill takes less than 2 seconds.  Neurological:     General: No focal deficit present.     Mental Status: She is alert and oriented to person, place, and time.     Motor: Weakness present.     Gait: Gait abnormal.  Psychiatric:        Mood and Affect: Mood normal.     Labs reviewed: Recent Labs    04/29/23 0541 05/09/23 0000 10/24/23 2107 10/26/23 0422  NA 141 143 139 141  K 3.7 4.2  3.7 3.4*  CL 106 107 104 106  CO2 26 26* 25 27  GLUCOSE  108*  --  165* 124*  BUN 8 11 18 9   CREATININE 0.52 0.6 0.64 0.37*  CALCIUM  8.7* 9.1 9.2 8.7*   Recent Labs    04/19/23 1144 04/20/23 0739 10/24/23 2107  AST 16 16 20   ALT 9 9 10   ALKPHOS 31* 27* 66  BILITOT 0.6 1.1 0.6  PROT 5.8* 5.0* 6.6  ALBUMIN 3.4* 3.0* 3.7   Recent Labs    04/19/23 1144 04/19/23 1227 04/26/23 1025 04/27/23 0629 07/14/23 0000 10/24/23 2107 10/25/23 1357 10/26/23 0422 10/27/23 0345  WBC 7.9   < > 5.7   < > 4.6   < > 7.1 7.4 9.3  NEUTROABS 5.3  --  3.7  --  2,640.00  --   --   --   --   HGB 5.9*   < > 8.8*   < > 11.7*   < > 9.7* 10.4* 9.4*  HCT 19.2*   < > 28.8*   < > 37   < > 31.9* 32.5* 30.4*  MCV 103.8*   < > 99.3   < >  --    < > 102.2* 100.9* 100.0  PLT 154   < > 196   < > 156   < > 153 154 152   < > = values in this interval not displayed.   Lab Results  Component Value Date   TSH 2.47 10/08/2022   No results found for: "HGBA1C" Lab Results  Component Value Date   CHOL 125 07/14/2023   HDL 51 07/14/2023   LDLCALC 53 07/14/2023   TRIG 130 07/14/2023    Significant Diagnostic Results in last 30 days:  No results found.  Assessment/Plan 1. Hematochezia (Primary) - ongoing - hospitalized 04/25-04/28 - EGD/colonoscopy> secondary to ischemic colitis - no bleeding while hospitalized - 1 episode upon AL return - cont Protonix  - cont Augmentin  - cbc/diff 04/29  2. Ischemic colitis (HCC) - see above  3. Chronic diastolic heart failure (HCC) - BNP 46 04/25 - mild ankle swelling  - cont compression stockings  4. Essential hypertension - controlled without medication  5. COPD mixed type (HCC) - no exacerbations - cont albuterol  and anoro ellipta   6. Depression with anxiety - no mood changes - cont Zoloft   7. OAB (overactive bladder) - cont oxybutynin   8. Blood loss anemia - discharge hgb 9.4 - cont ferrous gluconate     Family/ staff Communication: plan  discussed with patient and nurse  Labs/tests ordered:  cbc/diff 04/29

## 2023-10-27 NOTE — Discharge Summary (Addendum)
 Physician Discharge Summary   Patient: [name] MRN: [id number] DOB: 1941-01-08  Admit date:     10/24/2023  Discharge date: 10/27/23  Discharge Physician: Jerline Moon   PCP: Arnetha Bhat, NP   Recommendations at discharge:   Follow-up melena, ischemic colitis, treat with 2 weeks of Augmentin   Follow-up pathology from colonoscopy resection of polyps  Discharge Diagnoses: Principal Problem:   GI bleed Active Problems:   Depression with anxiety   Hyperlipidemia   Glaucoma   Edema of right lower extremity   Chronic heart failure with preserved ejection fraction (HFpEF) (HCC)   Ischemic colitis (HCC)  Resolved Problems:   * No resolved hospital problems. *  Hospital Course: 83 year old woman complex PMH with recurrent GI bleed and multiple investigations in the past, who presented with melena.  No bleeding in hospital.  Hemoglobin remained stable and patient did not require blood products.  Seen by gastroenterology, Upper and lower endoscopy and subsequently which suggested ischemic colitis.  Condition remained stable and patient was cleared for discharge.  Consultants [facility]  Procedures/Events 4/27 EGD multiple gastric polyps 4/27 colonoscopy hemorrhoids, segmental inflammation proximal ascending colon, cecum secondary to ischemic colitis, 20 mm polyp  GI bleed secondary to ischemic colitis Chronic anemia Large melena reported at assisted living.  None reported here.  No hematemesis. EGD unremarkable. Bleeding from likely right-sided segmental/ischemic colitis.  Colonoscopy showed ischemic colitis. Augmentin  for 2 weeks per [facility] diet.  No further inpatient GI workup planned.  Follow-up pathology on polyps   Chronic diastolic CHF Last echo done in November 2022 showing EF 60 to 65%, mild mitral regurgitation, mild aortic regurgitation.  Appears stable.  BNP within normal limits. Stable   Essential hypertension Blood pressure stable.    Hyperlipidemia RLS COPD bronchodilators Anxiety/depression Zoloft  Overactive bladder oxybutynin  Glaucoma Anemia home medications  Disposition: Assisted living Diet recommendation:  Regular diet DISCHARGE MEDICATION: Allergies as of 10/27/2023       Reactions   Morphine And Codeine Itching   Aspirin Other (See Comments)   Unknown reaction   Duloxetine  Hcl Other (See Comments)   Unknown reaction   Tymlos [abaloparatide] Other (See Comments)   Unknown reaction   Ventolin  [albuterol ] Other (See Comments)   rapid heart beat   Cefadroxil Hives   Patient can take amoxicillin  and cipro         Medication List     TAKE these medications    acetaminophen  650 MG CR tablet Commonly known as: TYLENOL  Take 1,300 mg by mouth in the morning and at bedtime.   acetaminophen  500 MG tablet Commonly known as: TYLENOL  Take 500 mg by mouth daily as needed for moderate pain (pain score 4-6).   albuterol  108 (90 Base) MCG/ACT inhaler Commonly known as: VENTOLIN  HFA Inhale 2 puffs into the lungs every 4 (four) hours as needed for wheezing or shortness of breath.   alendronate 70 MG tablet Commonly known as: FOSAMAX Take 70 mg by mouth every Sunday. Take with a full glass of water on an empty stomach.   Align 4 MG Caps Take 4 mg by mouth at bedtime.   amoxicillin -clavulanate 875-125 MG tablet Commonly known as: AUGMENTIN  Take 1 tablet by mouth every 12 (twelve) hours.   Anoro Ellipta  62.5-25 MCG/ACT Aepb Generic drug: umeclidinium-vilanterol Inhale 1 puff into the lungs every morning.   cetirizine  5 MG chewable tablet Commonly known as: ZYRTEC  Chew 5 mg by mouth daily as needed for allergies.   cyanocobalamin  1000 MCG tablet Commonly known  as: VITAMIN B12 Take 1,000 mcg by mouth daily.   docusate sodium  100 MG capsule Commonly known as: COLACE Take 200 mg by mouth at bedtime.   Dorzolamide  HCl-Timolol  Mal PF 2-0.5 % Soln Place 1 drop into both eyes in the morning  and at bedtime.   ferrous gluconate  240 (27 FE) MG tablet Commonly known as: FERGON Take 240 mg by mouth every morning. Take without food   fluticasone  50 MCG/ACT nasal spray Commonly known as: FLONASE  Place 1 spray into both nostrils daily as needed for allergies or rhinitis.   guaiFENesin  100 MG/5ML liquid Commonly known as: ROBITUSSIN Take 10 mLs by mouth every 4 (four) hours as needed for cough or to loosen phlegm.   latanoprost  0.005 % ophthalmic solution Commonly known as: XALATAN  Place 1 drop into both eyes at bedtime.   oxybutynin  10 MG 24 hr tablet Commonly known as: DITROPAN -XL Take 10 mg by mouth at bedtime.   pantoprazole  40 MG tablet Commonly known as: PROTONIX  Take 40 mg by mouth 2 (two) times daily.   polyethylene glycol 17 g packet Commonly known as: MIRALAX  / GLYCOLAX  Take 8.5 g by mouth daily as needed (constipation).   potassium chloride  SA 20 MEQ tablet Commonly known as: KLOR-CON  M Take 20 mEq by mouth once.   PreserVision AREDS 2 Caps Take 1 capsule by mouth 2 (two) times daily.   PreviDent 5000 Booster Plus 1.1 % Pste Generic drug: Sodium Fluoride  Place 1 Application onto teeth See admin instructions. Brush on teeth with a toothbrush after evening mouth care. Spit out excess and do not rinse.   Rhopressa  0.02 % Soln Generic drug: Netarsudil  Dimesylate Place 1 drop into both eyes at bedtime.   rOPINIRole  0.5 MG tablet Commonly known as: REQUIP  Take 1 tablet (0.5 mg total) by mouth daily as needed.   sertraline  50 MG tablet Commonly known as: ZOLOFT  Take 1.5 tablets (75 mg total) by mouth daily.   simvastatin  20 MG tablet Commonly known as: ZOCOR  Take 20 mg by mouth every evening.   torsemide 20 MG tablet Commonly known as: DEMADEX Take 20 mg by mouth daily.   vitamin C  1000 MG tablet Take 1,000 mg by mouth every morning.   Vitamin D3 50 MCG (2000 UT) Tabs Take 2,000 Units by mouth every morning.   zinc oxide 20 % ointment Apply  1 Application topically See admin instructions. Apply topically to buttocks after every incontinent episode and as needed for redness        Follow-up Information     [name], [name], NP. Schedule an appointment as soon as possible for a visit in 1 week(s).   Specialty: Adult Health Nurse Practitioner Contact information: [address] [place] [address] [phone]                Feels okay Some pain from procedures  Discharge Exam: Filed Weights   10/25/23 0600  Weight: 77.2 kg   Physical Exam Vitals reviewed.  Constitutional:      General: She is not in acute distress.    Appearance: She is not ill-appearing or toxic-appearing.  Cardiovascular:     Rate and Rhythm: Normal rate and regular rhythm.     Heart sounds: No murmur heard. Pulmonary:     Effort: Pulmonary effort is normal. No respiratory distress.     Breath sounds: No wheezing, rhonchi or rales.  Neurological:     Mental Status: She is alert.  Psychiatric:  Mood and Affect: Mood normal.        Behavior: Behavior normal.     Hgb stable at 9.4  Condition at discharge: good  The results of significant diagnostics from this hospitalization (including imaging, microbiology, ancillary and laboratory) are listed below for reference.   Imaging Studies: No results found.  Microbiology: Results for orders placed or performed during the hospital encounter of 04/19/23  MRSA Next Gen by PCR, Nasal     Status: None   Collection Time: 04/19/23  4:50 PM   Specimen: Nasal Mucosa; Nasal Swab  Result Value Ref Range Status   MRSA by PCR Next Gen NOT DETECTED NOT DETECTED Final    Comment: (NOTE) The GeneXpert MRSA Assay (FDA approved for NASAL specimens only), is one component of a comprehensive MRSA colonization surveillance program. It is not intended to diagnose MRSA infection nor to guide or monitor treatment for MRSA infections. Test performance is not FDA approved in patients less than 23  years old. Performed at [facility], [address] [place], [place] [address]     Labs: CBC: Recent Labs  Lab 10/24/23 2107 10/25/23 0510 10/25/23 1357 10/26/23 0422 10/27/23 0345  WBC 9.3  --  7.1 7.4 9.3  HGB 11.4* 9.9* 9.7* 10.4* 9.4*  HCT 36.8 32.2* 31.9* 32.5* 30.4*  MCV 101.9*  --  102.2* 100.9* 100.0  PLT 179  --  153 154 152   Basic Metabolic Panel: Recent Labs  Lab 10/24/23 2107 10/26/23 0422  NA 139 141  K 3.7 3.4*  CL 104 106  CO2 25 27  GLUCOSE 165* 124*  BUN 18 9  CREATININE 0.64 0.37*  CALCIUM  9.2 8.7*   Liver Function Tests: Recent Labs  Lab 10/24/23 2107  AST 20  ALT 10  ALKPHOS 66  BILITOT 0.6  PROT 6.6  ALBUMIN 3.7   CBG: Recent Labs  Lab 10/25/23 0808  GLUCAP 118*    Discharge time spent: less than 30 minutes.  Signed: Jerline Moon, MD [facility] 10/27/2023

## 2023-10-27 NOTE — Plan of Care (Signed)

## 2023-10-27 NOTE — TOC Transition Note (Signed)
 Transition of Care [facility]) - Discharge Note   Patient Details  Name: [name] MRN: [id number] Date of Birth: [birth year]  [facility]) CM/SW Contact:  Gertha Ku, LCSW Phone Number: 10/27/2023, 10:04 AM   Clinical Narrative:     Pt to d/c back to [facility].  RN to call report to [phone]. FL2 completed , PTAR called. TOC sign off.  Final next level of care: Assisted Living Barriers to Discharge: Barriers Resolved   Patient Goals and CMS Choice Patient states their goals for this hospitalization and ongoing recovery are:: Return to [facility]          Discharge Placement                       Discharge Plan and Services Additional resources added to the After Visit Summary for   In-house Referral: Clinical Social Work                                   Social Drivers of Health (SDOH) Interventions SDOH Screenings   Food Insecurity: No Food Insecurity (10/25/2023)  Housing: Low Risk  (10/25/2023)  Transportation Needs: No Transportation Needs (10/25/2023)  Utilities: Not At Risk (10/25/2023)  Alcohol  Screen: Low Risk  (04/04/2023)  Depression (PHQ2-9): Low Risk  (10/24/2023)  Financial Resource Strain: Low Risk  (04/04/2023)  Physical Activity: Insufficiently Active (04/04/2023)  Social Connections: Socially Integrated (10/25/2023)  Stress: No Stress Concern Present (04/04/2023)  Tobacco Use: Medium Risk (10/26/2023)  Health Literacy: Adequate Health Literacy (04/04/2023)     Readmission Risk Interventions    04/29/2023   11:10 AM 04/21/2023    2:05 PM 03/29/2023    9:45 AM  Readmission Risk Prevention Plan  Transportation Screening Complete Complete Complete  PCP or Specialist Appt within 5-7 Days   Complete  PCP or Specialist Appt within 3-5 Days Complete Complete   Home Care Screening   Complete  Medication Review (RN CM)   Complete  HRI or Home Care Consult Complete Complete   Social Work Consult  for Recovery Care Planning/Counseling Complete Complete   Palliative Care Screening Not Applicable Not Applicable   Medication Review [employer]) Complete Complete

## 2023-10-28 DIAGNOSIS — R41841 Cognitive communication deficit: Secondary | ICD-10-CM | POA: Diagnosis not present

## 2023-10-28 DIAGNOSIS — R1312 Dysphagia, oropharyngeal phase: Secondary | ICD-10-CM | POA: Diagnosis not present

## 2023-10-28 LAB — SURGICAL PATHOLOGY

## 2023-10-29 ENCOUNTER — Telehealth: Payer: Self-pay

## 2023-10-29 ENCOUNTER — Encounter (HOSPITAL_COMMUNITY): Payer: Self-pay | Admitting: Gastroenterology

## 2023-10-29 NOTE — Telephone Encounter (Signed)
 I called [employer] and let them know that Betty Moon, MD prescribed medication Amoxicillin  according to Epic. Patient was seen by PCP Arnetha Bhat, NP 10/27/2023 and I don't see an electronic prescription of medication. Pharmacist asked me for other providers phone number and NPI number to contact them. Message routed to PCP Arnetha Bhat, NP as Arlie Lain.

## 2023-10-29 NOTE — Telephone Encounter (Signed)
 Medication clarified with Geneticist, molecular via telephone.

## 2023-10-29 NOTE — Telephone Encounter (Signed)
 Noted,  Thank you!

## 2023-10-29 NOTE — Telephone Encounter (Signed)
 Copied from CRM [id number]. Topic: General - Other >> Oct 29, 2023 11:33 AM Blair Bumpers wrote: Reason for CRM: Ammon Bales, with [facility], called in regards to an order they received about Amoxicillin  Potassium Clavulanate. She states there is an allergy to cefadroxil that is listed for patient and they are wanting to know if patient can tolerate the Amoxicillin . Patient is a resident of [facility] [address]. Please give Ammon Bales a call back at [phone]. She stated she will also fax a copy of the order to the clinic as well.

## 2023-10-30 ENCOUNTER — Telehealth: Payer: Self-pay

## 2023-10-30 DIAGNOSIS — E785 Hyperlipidemia, unspecified: Secondary | ICD-10-CM | POA: Diagnosis not present

## 2023-10-30 DIAGNOSIS — I872 Venous insufficiency (chronic) (peripheral): Secondary | ICD-10-CM | POA: Diagnosis not present

## 2023-10-30 DIAGNOSIS — E559 Vitamin D deficiency, unspecified: Secondary | ICD-10-CM | POA: Diagnosis not present

## 2023-10-30 DIAGNOSIS — R1312 Dysphagia, oropharyngeal phase: Secondary | ICD-10-CM | POA: Diagnosis not present

## 2023-10-30 DIAGNOSIS — M810 Age-related osteoporosis without current pathological fracture: Secondary | ICD-10-CM | POA: Diagnosis not present

## 2023-10-30 DIAGNOSIS — J949 Pleural condition, unspecified: Secondary | ICD-10-CM | POA: Diagnosis not present

## 2023-10-30 DIAGNOSIS — R41841 Cognitive communication deficit: Secondary | ICD-10-CM | POA: Diagnosis not present

## 2023-10-30 NOTE — Telephone Encounter (Signed)
 Message routed to PCP Arnetha Bhat, NP for lab results.

## 2023-10-30 NOTE — Telephone Encounter (Signed)
 Copied from CRM [id number]. Topic: Clinical - Lab/Test Results >> Oct 30, 2023  2:26 PM Blair Bumpers wrote: Reason for CRM: Patient's husband called in stating that he wants someone to look at [facility] and tell him about his wife's hemoglobin. He states he got a notification stating to please look over hemoglobin results from 10/26/23. Mr. Meduna stated he does not understand & has questions. Please give patient's husband a call to go over lab results with him. CB#: [phone].

## 2023-10-31 NOTE — Telephone Encounter (Signed)
 [facility] nurse updated patient 05/01 in evening after results posted.

## 2023-10-31 NOTE — Telephone Encounter (Signed)
 Noted! Thank you

## 2023-11-04 DIAGNOSIS — R1312 Dysphagia, oropharyngeal phase: Secondary | ICD-10-CM | POA: Diagnosis not present

## 2023-11-04 DIAGNOSIS — R41841 Cognitive communication deficit: Secondary | ICD-10-CM | POA: Diagnosis not present

## 2023-11-06 DIAGNOSIS — R1312 Dysphagia, oropharyngeal phase: Secondary | ICD-10-CM | POA: Diagnosis not present

## 2023-11-06 DIAGNOSIS — R41841 Cognitive communication deficit: Secondary | ICD-10-CM | POA: Diagnosis not present

## 2023-11-11 DIAGNOSIS — R41841 Cognitive communication deficit: Secondary | ICD-10-CM | POA: Diagnosis not present

## 2023-11-11 DIAGNOSIS — R1312 Dysphagia, oropharyngeal phase: Secondary | ICD-10-CM | POA: Diagnosis not present

## 2023-11-13 DIAGNOSIS — R41841 Cognitive communication deficit: Secondary | ICD-10-CM | POA: Diagnosis not present

## 2023-11-13 DIAGNOSIS — R1312 Dysphagia, oropharyngeal phase: Secondary | ICD-10-CM | POA: Diagnosis not present

## 2023-11-13 DIAGNOSIS — F4001 Agoraphobia with panic disorder: Secondary | ICD-10-CM | POA: Diagnosis not present

## 2023-11-13 DIAGNOSIS — F330 Major depressive disorder, recurrent, mild: Secondary | ICD-10-CM | POA: Diagnosis not present

## 2023-11-15 DIAGNOSIS — K219 Gastro-esophageal reflux disease without esophagitis: Secondary | ICD-10-CM | POA: Diagnosis not present

## 2023-11-17 ENCOUNTER — Encounter: Payer: Self-pay | Admitting: Orthopedic Surgery

## 2023-11-17 ENCOUNTER — Non-Acute Institutional Stay: Payer: Self-pay | Admitting: Orthopedic Surgery

## 2023-11-17 DIAGNOSIS — K559 Vascular disorder of intestine, unspecified: Secondary | ICD-10-CM | POA: Diagnosis not present

## 2023-11-17 DIAGNOSIS — K921 Melena: Secondary | ICD-10-CM | POA: Diagnosis not present

## 2023-11-17 DIAGNOSIS — D649 Anemia, unspecified: Secondary | ICD-10-CM | POA: Diagnosis not present

## 2023-11-17 NOTE — Progress Notes (Signed)
 Location:  [facility] Room Number: 20/A Place of Service:  ALF [phone]) Provider:  Arnetha Bhat, NP   Arnetha Bhat, NP  Patient Care Team: Arnetha Bhat, NP as PCP - General (Adult Health Nurse Practitioner) Marguerite Shiley, MD as Consulting Physician (Internal Medicine)  Extended Emergency Contact Information Primary Emergency Contact: Duer,[name] Address: [address]          [place], [place] [address] [place] Home Phone: [phone] Mobile Phone: [phone] Relation: Spouse Secondary Emergency Contact: Raupp MD,[name] Tracey Friday Phone: [phone] Relation: Son Interpreter needed? No  Code Status:  DNR Goals of care: Advanced Directive information    10/26/2023    1:17 PM  Advanced Directives  Does Patient Have a Medical Advance Directive? Yes  Pre-existing out of facility DNR order (yellow form or pink MOST form) Pink MOST form placed in chart (order not valid for inpatient use)     Chief Complaint  Patient presents with   Acute Visit    Bloody stools    HPI:  Pt is a 83 y.o. female seen today for acute visit due to bloody stools.   She currently resides on the assisted living unit at [facility]. PMH: HTN, COPD, pulmonary nodule, Diverticulosis with GI bleed, GERD, OA, osteoporosis,macular degeneration, iron  def anemia, unstable gait, urinary incontinence, anxiety, and depression.   "H/o diverticular bleed 11/2020 s/p embolization with recurrence. Multiple hospitalizations due to bloody stools. Colonoscopy 2023 noted blood in colon and diffuse diverticulosis. Past EGD noted gastric polyps, duodenal bulb and gastritis." Remains on Protonix .   Hospitalized 04/25-04/28 due to blood stools and dropping hemoglobin. EGD noted multiple gastric polyps. Colonoscopy found segmental inflammation to proximal ascending colon cecum secondary to ischemic colitis.   05/18 she has one episode of blood stools. Today she has 2 additional  episodes. She denies abdominal pain, N/V, dizziness or fatigue. Plan to repeat cbc/diff today.      Past Medical History:  Diagnosis Date   Anxiety    Arthritis    "bilateral knees, shoulders, elbows; neck, pretty widespread" (09/05/2017)   BPPV (benign paroxysmal positional vertigo)    Depression    GERD (gastroesophageal reflux disease)    Glaucoma, both eyes    Headache    "probably 2/month" (09/05/2017)   History of blood transfusion ~ 2008   "related to LGIB"   Hyperlipemia    Lower GI bleeding ~ 2008; 09/05/2017   "had to have blood transfusion"   Macular degeneration, bilateral    Osteopenia    Seasonal allergies    Skin cancer, basal cell 2001   "off my nose, left side"   Sleeping excessive    Tinnitus of both ears    Past Surgical History:  Procedure Laterality Date   BALLOON DILATION N/A 05/08/2021   Procedure: BALLOON DILATION;  Surgeon: Genell Ken, MD;  Location: [facility] ENDOSCOPY;  Service: Gastroenterology;  Laterality: N/A;   BASAL CELL CARCINOMA EXCISION  2001   "off my nose, left side"   BIOPSY  05/08/2021   Procedure: BIOPSY;  Surgeon: Genell Ken, MD;  Location: [facility] ENDOSCOPY;  Service: Gastroenterology;;   BIOPSY  04/25/2023   Procedure: BIOPSY;  Surgeon: Baldo Bonds, MD;  Location: [facility] ENDOSCOPY;  Service: Gastroenterology;;   BLEPHAROPLASTY Bilateral    BONE BIOPSY  10/26/2023   Procedure: BIOPSY, GI;  Surgeon: Felecia Hopper, MD;  Location: [facility] ENDOSCOPY;  Service: Gastroenterology;;   CATARACT EXTRACTION W/ INTRAOCULAR LENS  IMPLANT, BILATERAL Bilateral  1990's   COLONOSCOPY N/A 10/26/2023   Procedure: COLONOSCOPY;  Surgeon: Felecia Hopper, MD;  Location: [facility] ENDOSCOPY;  Service: Gastroenterology;  Laterality: N/A;   COLONOSCOPY WITH PROPOFOL  N/A 05/04/2022   Procedure: COLONOSCOPY WITH PROPOFOL ;  Surgeon: Genell Ken, MD;  Location: [facility] ENDOSCOPY;  Service: Gastroenterology;  Laterality: N/A;   COLONOSCOPY WITH PROPOFOL  N/A 04/25/2023   Procedure:  COLONOSCOPY WITH PROPOFOL ;  Surgeon: Baldo Bonds, MD;  Location: [facility] ENDOSCOPY;  Service: Gastroenterology;  Laterality: N/A;   ESOPHAGOGASTRODUODENOSCOPY N/A 10/26/2023   Procedure: EGD (ESOPHAGOGASTRODUODENOSCOPY);  Surgeon: Felecia Hopper, MD;  Location: [facility] ENDOSCOPY;  Service: Gastroenterology;  Laterality: N/A;   ESOPHAGOGASTRODUODENOSCOPY (EGD) WITH PROPOFOL  N/A 05/08/2021   Procedure: ESOPHAGOGASTRODUODENOSCOPY (EGD) WITH PROPOFOL ;  Surgeon: Genell Ken, MD;  Location: [facility] ENDOSCOPY;  Service: Gastroenterology;  Laterality: N/A;   ESOPHAGOGASTRODUODENOSCOPY (EGD) WITH PROPOFOL  N/A 01/11/2022   Procedure: ESOPHAGOGASTRODUODENOSCOPY (EGD) WITH PROPOFOL ;  Surgeon: Ozell Blunt, MD;  Location: [facility] ENDOSCOPY;  Service: Gastroenterology;  Laterality: N/A;   ESOPHAGOGASTRODUODENOSCOPY (EGD) WITH PROPOFOL  N/A 01/06/2023   Procedure: ESOPHAGOGASTRODUODENOSCOPY (EGD) WITH PROPOFOL ;  Surgeon: Ozell Blunt, MD;  Location: [facility] ENDOSCOPY;  Service: Gastroenterology;  Laterality: N/A;   ESOPHAGOGASTRODUODENOSCOPY (EGD) WITH PROPOFOL  N/A 04/21/2023   Procedure: ESOPHAGOGASTRODUODENOSCOPY (EGD) WITH PROPOFOL ;  Surgeon: Baldo Bonds, MD;  Location: [facility] ENDOSCOPY;  Service: Gastroenterology;  Laterality: N/A;   EYE SURGERY Bilateral    "to improve vision after cataract OR"   GIVENS CAPSULE STUDY N/A 04/21/2023   Procedure: GIVENS CAPSULE STUDY;  Surgeon: Baldo Bonds, MD;  Location: [facility] ENDOSCOPY;  Service: Gastroenterology;  Laterality: N/A;   HEMOSTASIS CLIP PLACEMENT  10/26/2023   Procedure: CONTROL OF HEMORRHAGE, GI TRACT, ENDOSCOPIC, BY CLIPPING OR OVERSEWING;  Surgeon: Felecia Hopper, MD;  Location: [facility] ENDOSCOPY;  Service: Gastroenterology;;   IR ANGIOGRAM FOLLOW UP STUDY  12/19/2020   IR ANGIOGRAM SELECTIVE EACH ADDITIONAL VESSEL  12/19/2020   IR ANGIOGRAM SELECTIVE EACH ADDITIONAL VESSEL  12/19/2020   IR ANGIOGRAM VISCERAL SELECTIVE  12/19/2020   IR EMBO ART  VEN HEMORR LYMPH EXTRAV  INC GUIDE  ROADMAPPING  12/19/2020   IR US  GUIDE VASC ACCESS RIGHT  12/19/2020   JOINT REPLACEMENT     POLYPECTOMY  05/04/2022   Procedure: POLYPECTOMY;  Surgeon: Genell Ken, MD;  Location: [facility] ENDOSCOPY;  Service: Gastroenterology;;   POLYPECTOMY  10/26/2023   Procedure: POLYPECTOMY, INTESTINE;  Surgeon: Felecia Hopper, MD;  Location: [facility] ENDOSCOPY;  Service: Gastroenterology;;   STAPEDES SURGERY Left    "scraped stapedes because it was sticking when it wasn't suppose to"   TONSILLECTOMY AND ADENOIDECTOMY  1946   TOTAL KNEE ARTHROPLASTY Left ~ 2008   TOTAL KNEE ARTHROPLASTY  12/20/2011   Procedure: TOTAL KNEE ARTHROPLASTY;  Surgeon: Lorriane Rote, MD;  Location: [facility] OR;  Service: Orthopedics;  Laterality: Right;  Right Total Knee Arthroplasty   TUBAL LIGATION  1980's    Allergies  Allergen Reactions   Morphine And Codeine Itching   Aspirin Other (See Comments)    Unknown reaction   Duloxetine  Hcl Other (See Comments)    Unknown reaction   Tymlos [Abaloparatide] Other (See Comments)    Unknown reaction   Ventolin  [Albuterol ] Other (See Comments)    rapid heart beat   Cefadroxil Hives    Patient can take amoxicillin  and cipro     Outpatient Encounter Medications as of 11/17/2023  Medication Sig   acetaminophen  (TYLENOL ) 500 MG tablet Take 500 mg by mouth daily as needed for moderate pain (pain score 4-6).   acetaminophen  (TYLENOL ) 650  MG CR tablet Take 1,300 mg by mouth in the morning and at bedtime.   albuterol  (VENTOLIN  HFA) 108 (90 Base) MCG/ACT inhaler Inhale 2 puffs into the lungs every 4 (four) hours as needed for wheezing or shortness of breath.   alendronate (FOSAMAX) 70 MG tablet Take 70 mg by mouth every Sunday. Take with a full glass of water on an empty stomach.   amoxicillin -clavulanate (AUGMENTIN ) 875-125 MG tablet Take 1 tablet by mouth every 12 (twelve) hours.   Ascorbic Acid  (VITAMIN C ) 1000 MG tablet Take 1,000 mg by mouth every morning.   cetirizine  (ZYRTEC ) 5 MG chewable  tablet Chew 5 mg by mouth daily as needed for allergies.   Cholecalciferol  (VITAMIN D3) 50 MCG (2000 UT) TABS Take 2,000 Units by mouth every morning.   cyanocobalamin  (VITAMIN B12) 1000 MCG tablet Take 1,000 mcg by mouth daily.   docusate sodium  (COLACE) 100 MG capsule Take 200 mg by mouth at bedtime.   Dorzolamide  HCl-Timolol  Mal PF 2-0.5 % SOLN Place 1 drop into both eyes in the morning and at bedtime.   ferrous gluconate  (FERGON) 240 (27 FE) MG tablet Take 240 mg by mouth every morning. Take without food   fluticasone  (FLONASE ) 50 MCG/ACT nasal spray Place 1 spray into both nostrils daily as needed for allergies or rhinitis.   guaiFENesin  (ROBITUSSIN) 100 MG/5ML liquid Take 10 mLs by mouth every 4 (four) hours as needed for cough or to loosen phlegm.   latanoprost  (XALATAN ) 0.005 % ophthalmic solution Place 1 drop into both eyes at bedtime.   Multiple Vitamins-Minerals (PRESERVISION AREDS 2) CAPS Take 1 capsule by mouth 2 (two) times daily.   Netarsudil  Dimesylate (RHOPRESSA ) 0.02 % SOLN Place 1 drop into both eyes at bedtime.   oxybutynin  (DITROPAN -XL) 10 MG 24 hr tablet Take 10 mg by mouth at bedtime.   pantoprazole  (PROTONIX ) 40 MG tablet Take 40 mg by mouth 2 (two) times daily.   polyethylene glycol (MIRALAX  / GLYCOLAX ) 17 g packet Take 8.5 g by mouth daily as needed (constipation).   potassium chloride  SA (KLOR-CON  M) 20 MEQ tablet Take 20 mEq by mouth once.   Probiotic Product (ALIGN) 4 MG CAPS Take 4 mg by mouth at bedtime.   rOPINIRole  (REQUIP ) 0.5 MG tablet Take 1 tablet (0.5 mg total) by mouth daily as needed.   sertraline  (ZOLOFT ) 50 MG tablet Take 1.5 tablets (75 mg total) by mouth daily.   simvastatin  (ZOCOR ) 20 MG tablet Take 20 mg by mouth every evening.   Sodium Fluoride  (PREVIDENT 5000 BOOSTER PLUS) 1.1 % PSTE Place 1 Application onto teeth See admin instructions. Brush on teeth with a toothbrush after evening mouth care. Spit out excess and do not rinse.   torsemide   (DEMADEX ) 20 MG tablet Take 20 mg by mouth daily.   umeclidinium-vilanterol (ANORO ELLIPTA ) 62.5-25 MCG/ACT AEPB Inhale 1 puff into the lungs every morning.   zinc oxide 20 % ointment Apply 1 Application topically See admin instructions. Apply topically to buttocks after every incontinent episode and as needed for redness   No facility-administered encounter medications on file as of 11/17/2023.    Review of Systems  Constitutional:  Negative for fatigue and fever.  Respiratory:  Negative for shortness of breath and stridor.   Cardiovascular:  Negative for chest pain and leg swelling.  Gastrointestinal:  Positive for blood in stool. Negative for abdominal distention, abdominal pain, nausea, rectal pain and vomiting.  Genitourinary:  Negative for hematuria.  Neurological:  Positive for weakness. Negative  for dizziness and light-headedness.  Psychiatric/Behavioral:  Negative for dysphoric mood. The patient is not nervous/anxious.     Immunization History  Administered Date(s) Administered   Fluad Quad(high Dose 65+) 04/24/2022   Fluad Trivalent(High Dose 65+) 04/27/2023   Influenza Inj Mdck Quad Pf 04/12/2017   Influenza, High Dose Seasonal PF 03/14/2018   Influenza, Quadrivalent, Recombinant, Inj, Pf 03/05/2019, 05/22/2021   PFIZER(Purple Top)SARS-COV-2 Vaccination 07/25/2019, 08/16/2019, 12/12/2021   Pneumococcal-Unspecified 03/02/2011   Tdap 03/12/2018   Zoster Recombinant(Shingrix) 10/08/2017, 12/09/2017   Pertinent  Health Maintenance Due  Topic Date Due   DEXA SCAN  Never done   INFLUENZA VACCINE  01/30/2024      10/30/2022   12:44 PM 01/22/2023    1:15 PM 04/04/2023   11:00 AM 07/10/2023    9:02 AM 10/24/2023    2:27 PM  Fall Risk  Falls in the past year? 0 0 0 0 0  Was there an injury with Fall? 0 0 0 0 0  Fall Risk Category Calculator 0 0 0 0 0  Patient at Risk for Falls Due to History of fall(s);Impaired balance/gait;Impaired mobility History of fall(s);Impaired  balance/gait History of fall(s);Impaired balance/gait;Impaired mobility History of fall(s);Impaired balance/gait;Impaired mobility History of fall(s);Impaired balance/gait;Impaired vision  Fall risk Follow up Falls evaluation completed;Education provided;Falls prevention discussed Falls evaluation completed;Education provided;Falls prevention discussed Falls evaluation completed;Education provided;Falls prevention discussed Falls evaluation completed;Education provided;Falls prevention discussed Falls evaluation completed;Education provided   Functional Status Survey:    Vitals:   11/17/23 0932  BP: 111/62  Pulse: 81  Resp: 16  Temp: 97.7 F (36.5 C)  SpO2: 95%  Weight: 170 lb 3.2 oz (77.2 kg)  Height: 5\' 1"  (1.549 m)   Body mass index is 32.16 kg/m. Physical Exam Vitals reviewed.  Constitutional:      General: She is not in acute distress.    Comments: Pale complexion  Eyes:     General:        Right eye: No discharge.        Left eye: No discharge.  Cardiovascular:     Rate and Rhythm: Normal rate and regular rhythm.     Pulses: Normal pulses.     Heart sounds: Normal heart sounds.  Pulmonary:     Effort: Pulmonary effort is normal.     Breath sounds: Normal breath sounds.  Abdominal:     General: Bowel sounds are normal. There is no distension.     Palpations: Abdomen is soft. There is no mass.     Tenderness: There is no abdominal tenderness. There is no guarding or rebound.     Hernia: No hernia is present.  Musculoskeletal:     Cervical back: Neck supple.     Right lower leg: Edema present.     Left lower leg: Edema present.     Comments: Non pitting  Skin:    General: Skin is warm.     Capillary Refill: Capillary refill takes less than 2 seconds.  Neurological:     General: No focal deficit present.     Mental Status: She is alert and oriented to person, place, and time.     Motor: Weakness present.     Gait: Gait abnormal.     Comments: walker   Psychiatric:        Mood and Affect: Mood normal.     Labs reviewed: Recent Labs    04/29/23 0541 05/09/23 0000 10/24/23 2107 10/26/23 0422  NA 141 143 139 141  K  3.7 4.2 3.7 3.4*  CL 106 107 104 106  CO2 26 26* 25 27  GLUCOSE 108*  --  165* 124*  BUN 8 11 18 9   CREATININE 0.52 0.6 0.64 0.37*  CALCIUM  8.7* 9.1 9.2 8.7*   Recent Labs    04/19/23 1144 04/20/23 0739 10/24/23 2107  AST 16 16 20   ALT 9 9 10   ALKPHOS 31* 27* 66  BILITOT 0.6 1.1 0.6  PROT 5.8* 5.0* 6.6  ALBUMIN 3.4* 3.0* 3.7   Recent Labs    04/19/23 1144 04/19/23 1227 04/26/23 1025 04/27/23 0629 07/14/23 0000 10/24/23 2107 10/25/23 1357 10/26/23 0422 10/27/23 0345  WBC 7.9   < > 5.7   < > 4.6   < > 7.1 7.4 9.3  NEUTROABS 5.3  --  3.7  --  2,640.00  --   --   --   --   HGB 5.9*   < > 8.8*   < > 11.7*   < > 9.7* 10.4* 9.4*  HCT 19.2*   < > 28.8*   < > 37   < > 31.9* 32.5* 30.4*  MCV 103.8*   < > 99.3   < >  --    < > 102.2* 100.9* 100.0  PLT 154   < > 196   < > 156   < > 153 154 152   < > = values in this interval not displayed.   Lab Results  Component Value Date   TSH 2.47 10/08/2022   No results found for: "HGBA1C" Lab Results  Component Value Date   CHOL 125 07/14/2023   HDL 51 07/14/2023   LDLCALC 53 07/14/2023   TRIG 130 07/14/2023    Significant Diagnostic Results in last 30 days:  No results found.  Assessment/Plan 1. Hematochezia (Primary) - ongoing, appears episodes becoming more frequent  - one episode 05/18> stat cbc/diff> hgb was 10.5 - 05/19 2 more episodes> order stat cbc/diff - recently hospitalized 04/25-04/28> EGD/colonoscopy> secondary to ischemic colitis  - recommend ED evaluation if tachycardia, hypotension or hgb < 8 - cont Protonix   2. Ischemic colitis (HCC) - see above    Family/ staff Communication: plan discussed with patient and nurse  Labs/tests ordered:  stat cbc/diff

## 2023-11-18 ENCOUNTER — Encounter (HOSPITAL_COMMUNITY): Payer: Self-pay

## 2023-11-18 ENCOUNTER — Other Ambulatory Visit: Payer: Self-pay

## 2023-11-18 ENCOUNTER — Encounter (HOSPITAL_COMMUNITY)
Admission: EM | Disposition: A | Discharge status: Skilled Nursing Facility | Payer: Self-pay | Source: Skilled Nursing Facility | Attending: Internal Medicine

## 2023-11-18 ENCOUNTER — Inpatient Hospital Stay (HOSPITAL_COMMUNITY)
Admission: EM | Admit: 2023-11-18 | Discharge: 2023-11-26 | DRG: 811 | Disposition: A | Discharge status: Skilled Nursing Facility | Source: Skilled Nursing Facility | Attending: Internal Medicine | Admitting: Internal Medicine

## 2023-11-18 DIAGNOSIS — K219 Gastro-esophageal reflux disease without esophagitis: Secondary | ICD-10-CM | POA: Diagnosis present

## 2023-11-18 DIAGNOSIS — M810 Age-related osteoporosis without current pathological fracture: Secondary | ICD-10-CM | POA: Diagnosis not present

## 2023-11-18 DIAGNOSIS — G2581 Restless legs syndrome: Secondary | ICD-10-CM | POA: Diagnosis not present

## 2023-11-18 DIAGNOSIS — I700 Atherosclerosis of aorta: Secondary | ICD-10-CM | POA: Diagnosis present

## 2023-11-18 DIAGNOSIS — R531 Weakness: Secondary | ICD-10-CM | POA: Diagnosis not present

## 2023-11-18 DIAGNOSIS — Z7951 Long term (current) use of inhaled steroids: Secondary | ICD-10-CM

## 2023-11-18 DIAGNOSIS — E785 Hyperlipidemia, unspecified: Secondary | ICD-10-CM | POA: Diagnosis present

## 2023-11-18 DIAGNOSIS — Z7401 Bed confinement status: Secondary | ICD-10-CM | POA: Diagnosis not present

## 2023-11-18 DIAGNOSIS — K921 Melena: Secondary | ICD-10-CM | POA: Diagnosis not present

## 2023-11-18 DIAGNOSIS — K9289 Other specified diseases of the digestive system: Secondary | ICD-10-CM | POA: Diagnosis not present

## 2023-11-18 DIAGNOSIS — I10 Essential (primary) hypertension: Secondary | ICD-10-CM | POA: Diagnosis not present

## 2023-11-18 DIAGNOSIS — Z888 Allergy status to other drugs, medicaments and biological substances status: Secondary | ICD-10-CM

## 2023-11-18 DIAGNOSIS — R000 Tachycardia, unspecified: Secondary | ICD-10-CM | POA: Diagnosis not present

## 2023-11-18 DIAGNOSIS — K2971 Gastritis, unspecified, with bleeding: Secondary | ICD-10-CM | POA: Diagnosis not present

## 2023-11-18 DIAGNOSIS — F32A Depression, unspecified: Secondary | ICD-10-CM | POA: Diagnosis present

## 2023-11-18 DIAGNOSIS — R58 Hemorrhage, not elsewhere classified: Secondary | ICD-10-CM | POA: Diagnosis not present

## 2023-11-18 DIAGNOSIS — R791 Abnormal coagulation profile: Secondary | ICD-10-CM | POA: Diagnosis not present

## 2023-11-18 DIAGNOSIS — Z87891 Personal history of nicotine dependence: Secondary | ICD-10-CM

## 2023-11-18 DIAGNOSIS — Z743 Need for continuous supervision: Secondary | ICD-10-CM | POA: Diagnosis not present

## 2023-11-18 DIAGNOSIS — D509 Iron deficiency anemia, unspecified: Secondary | ICD-10-CM | POA: Diagnosis present

## 2023-11-18 DIAGNOSIS — K625 Hemorrhage of anus and rectum: Secondary | ICD-10-CM | POA: Diagnosis not present

## 2023-11-18 DIAGNOSIS — M159 Polyosteoarthritis, unspecified: Secondary | ICD-10-CM | POA: Diagnosis not present

## 2023-11-18 DIAGNOSIS — R911 Solitary pulmonary nodule: Secondary | ICD-10-CM | POA: Diagnosis present

## 2023-11-18 DIAGNOSIS — K317 Polyp of stomach and duodenum: Secondary | ICD-10-CM | POA: Diagnosis present

## 2023-11-18 DIAGNOSIS — M1990 Unspecified osteoarthritis, unspecified site: Secondary | ICD-10-CM | POA: Diagnosis present

## 2023-11-18 DIAGNOSIS — Z6832 Body mass index (BMI) 32.0-32.9, adult: Secondary | ICD-10-CM

## 2023-11-18 DIAGNOSIS — D62 Acute posthemorrhagic anemia: Principal | ICD-10-CM | POA: Diagnosis present

## 2023-11-18 DIAGNOSIS — I5032 Chronic diastolic (congestive) heart failure: Secondary | ICD-10-CM | POA: Diagnosis not present

## 2023-11-18 DIAGNOSIS — Z880 Allergy status to penicillin: Secondary | ICD-10-CM

## 2023-11-18 DIAGNOSIS — F419 Anxiety disorder, unspecified: Secondary | ICD-10-CM | POA: Diagnosis present

## 2023-11-18 DIAGNOSIS — Z66 Do not resuscitate: Secondary | ICD-10-CM | POA: Diagnosis not present

## 2023-11-18 DIAGNOSIS — D500 Iron deficiency anemia secondary to blood loss (chronic): Secondary | ICD-10-CM | POA: Diagnosis not present

## 2023-11-18 DIAGNOSIS — J449 Chronic obstructive pulmonary disease, unspecified: Secondary | ICD-10-CM | POA: Diagnosis not present

## 2023-11-18 DIAGNOSIS — R54 Age-related physical debility: Secondary | ICD-10-CM | POA: Diagnosis present

## 2023-11-18 DIAGNOSIS — Z7952 Long term (current) use of systemic steroids: Secondary | ICD-10-CM

## 2023-11-18 DIAGNOSIS — K922 Gastrointestinal hemorrhage, unspecified: Secondary | ICD-10-CM | POA: Diagnosis not present

## 2023-11-18 DIAGNOSIS — N3281 Overactive bladder: Secondary | ICD-10-CM | POA: Diagnosis not present

## 2023-11-18 DIAGNOSIS — Z885 Allergy status to narcotic agent status: Secondary | ICD-10-CM

## 2023-11-18 DIAGNOSIS — Z79899 Other long term (current) drug therapy: Secondary | ICD-10-CM

## 2023-11-18 DIAGNOSIS — Z96653 Presence of artificial knee joint, bilateral: Secondary | ICD-10-CM | POA: Diagnosis present

## 2023-11-18 DIAGNOSIS — E66811 Obesity, class 1: Secondary | ICD-10-CM | POA: Diagnosis not present

## 2023-11-18 DIAGNOSIS — D132 Benign neoplasm of duodenum: Secondary | ICD-10-CM | POA: Diagnosis present

## 2023-11-18 DIAGNOSIS — Z85828 Personal history of other malignant neoplasm of skin: Secondary | ICD-10-CM

## 2023-11-18 DIAGNOSIS — F418 Other specified anxiety disorders: Secondary | ICD-10-CM | POA: Diagnosis present

## 2023-11-18 DIAGNOSIS — D649 Anemia, unspecified: Secondary | ICD-10-CM

## 2023-11-18 DIAGNOSIS — E876 Hypokalemia: Secondary | ICD-10-CM | POA: Diagnosis present

## 2023-11-18 DIAGNOSIS — H3530 Unspecified macular degeneration: Secondary | ICD-10-CM | POA: Diagnosis present

## 2023-11-18 DIAGNOSIS — I110 Hypertensive heart disease with heart failure: Secondary | ICD-10-CM | POA: Diagnosis present

## 2023-11-18 DIAGNOSIS — H409 Unspecified glaucoma: Secondary | ICD-10-CM | POA: Diagnosis present

## 2023-11-18 DIAGNOSIS — Z881 Allergy status to other antibiotic agents status: Secondary | ICD-10-CM

## 2023-11-18 DIAGNOSIS — H9190 Unspecified hearing loss, unspecified ear: Secondary | ICD-10-CM | POA: Diagnosis present

## 2023-11-18 DIAGNOSIS — Z8249 Family history of ischemic heart disease and other diseases of the circulatory system: Secondary | ICD-10-CM

## 2023-11-18 DIAGNOSIS — K5791 Diverticulosis of intestine, part unspecified, without perforation or abscess with bleeding: Secondary | ICD-10-CM | POA: Diagnosis present

## 2023-11-18 DIAGNOSIS — K2970 Gastritis, unspecified, without bleeding: Secondary | ICD-10-CM | POA: Diagnosis not present

## 2023-11-18 DIAGNOSIS — Z7983 Long term (current) use of bisphosphonates: Secondary | ICD-10-CM

## 2023-11-18 DIAGNOSIS — Z8052 Family history of malignant neoplasm of bladder: Secondary | ICD-10-CM

## 2023-11-18 DIAGNOSIS — I5030 Unspecified diastolic (congestive) heart failure: Secondary | ICD-10-CM | POA: Diagnosis present

## 2023-11-18 HISTORY — PX: GIVENS CAPSULE STUDY: SHX5432

## 2023-11-18 LAB — CBC WITH DIFFERENTIAL/PLATELET
Abs Immature Granulocytes: 0.04 10*3/uL (ref 0.00–0.07)
Basophils Absolute: 0 10*3/uL (ref 0.0–0.1)
Basophils Relative: 0 %
Eosinophils Absolute: 0 10*3/uL (ref 0.0–0.5)
Eosinophils Relative: 0 %
HCT: 17 % — ABNORMAL LOW (ref 36.0–46.0)
Hemoglobin: 5.3 g/dL — CL (ref 12.0–15.0)
Immature Granulocytes: 0 %
Lymphocytes Relative: 22 %
Lymphs Abs: 2.3 10*3/uL (ref 0.7–4.0)
MCH: 32.3 pg (ref 26.0–34.0)
MCHC: 31.2 g/dL (ref 30.0–36.0)
MCV: 103.7 fL — ABNORMAL HIGH (ref 80.0–100.0)
Monocytes Absolute: 1 10*3/uL (ref 0.1–1.0)
Monocytes Relative: 10 %
Neutro Abs: 6.9 10*3/uL (ref 1.7–7.7)
Neutrophils Relative %: 68 %
Platelets: 179 10*3/uL (ref 150–400)
RBC: 1.64 MIL/uL — ABNORMAL LOW (ref 3.87–5.11)
RDW: 16.5 % — ABNORMAL HIGH (ref 11.5–15.5)
WBC: 10.3 10*3/uL (ref 4.0–10.5)
nRBC: 0.5 % — ABNORMAL HIGH (ref 0.0–0.2)

## 2023-11-18 LAB — COMPREHENSIVE METABOLIC PANEL WITH GFR
ALT: 10 U/L (ref 0–44)
AST: 15 U/L (ref 15–41)
Albumin: 3.3 g/dL — ABNORMAL LOW (ref 3.5–5.0)
Alkaline Phosphatase: 40 U/L (ref 38–126)
Anion gap: 10 (ref 5–15)
BUN: 28 mg/dL — ABNORMAL HIGH (ref 8–23)
CO2: 24 mmol/L (ref 22–32)
Calcium: 8 mg/dL — ABNORMAL LOW (ref 8.9–10.3)
Chloride: 101 mmol/L (ref 98–111)
Creatinine, Ser: 0.6 mg/dL (ref 0.44–1.00)
GFR, Estimated: >60 mL/min (ref >60)
Glucose, Bld: 153 mg/dL — ABNORMAL HIGH (ref 70–99)
Potassium: 3.4 mmol/L — ABNORMAL LOW (ref 3.5–5.1)
Sodium: 135 mmol/L (ref 135–145)
Total Bilirubin: 0.7 mg/dL (ref 0.0–1.2)
Total Protein: 5.5 g/dL — ABNORMAL LOW (ref 6.5–8.1)

## 2023-11-18 LAB — HEMOGLOBIN AND HEMATOCRIT, BLOOD
HCT: 16.7 % — ABNORMAL LOW (ref 36.0–46.0)
HCT: 23.2 % — ABNORMAL LOW (ref 36.0–46.0)
HCT: 23.8 % — ABNORMAL LOW (ref 36.0–46.0)
Hemoglobin: 5.4 g/dL — CL (ref 12.0–15.0)
Hemoglobin: 7.9 g/dL — ABNORMAL LOW (ref 12.0–15.0)
Hemoglobin: 7.7 g/dL — ABNORMAL LOW (ref 12.0–15.0)

## 2023-11-18 LAB — PHOSPHORUS: Phosphorus: 2.6 mg/dL (ref 2.5–4.6)

## 2023-11-18 LAB — BASIC METABOLIC PANEL WITH GFR
Anion gap: 8 (ref 5–15)
BUN: 25 mg/dL — ABNORMAL HIGH (ref 8–23)
CO2: 25 mmol/L (ref 22–32)
Calcium: 7.4 mg/dL — ABNORMAL LOW (ref 8.9–10.3)
Chloride: 105 mmol/L (ref 98–111)
Creatinine, Ser: 0.57 mg/dL (ref 0.44–1.00)
GFR, Estimated: >60 mL/min (ref >60)
Glucose, Bld: 148 mg/dL — ABNORMAL HIGH (ref 70–99)
Potassium: 3 mmol/L — ABNORMAL LOW (ref 3.5–5.1)
Sodium: 138 mmol/L (ref 135–145)

## 2023-11-18 LAB — PREPARE RBC (CROSSMATCH)

## 2023-11-18 LAB — POC OCCULT BLOOD, ED: Fecal Occult Bld: POSITIVE — AB

## 2023-11-18 LAB — MRSA NEXT GEN BY PCR, NASAL: MRSA by PCR Next Gen: NOT DETECTED

## 2023-11-18 LAB — MAGNESIUM: Magnesium: 1.9 mg/dL (ref 1.7–2.4)

## 2023-11-18 SURGERY — IMAGING PROCEDURE, GI TRACT, INTRALUMINAL, VIA CAPSULE
Anesthesia: LOCAL

## 2023-11-18 MED ORDER — ACETAMINOPHEN 325 MG PO TABS
650.0000 mg | ORAL_TABLET | Freq: Four times a day (QID) | ORAL | Status: DC | PRN
  Administered 2023-11-19 – 2023-11-26 (×3): 650 mg via ORAL
  Filled 2023-11-18 (×3): qty 2

## 2023-11-18 MED ORDER — ACETAMINOPHEN 650 MG RE SUPP: 650.0000 mg | Freq: Four times a day (QID) | RECTAL | Status: DC | PRN

## 2023-11-18 MED ORDER — ORAL CARE MOUTH RINSE: 15.0000 mL | OROMUCOSAL | Status: DC | PRN

## 2023-11-18 MED ORDER — SODIUM CHLORIDE 0.9 % IV SOLN: INTRAVENOUS | Status: DC

## 2023-11-18 MED ORDER — CHLORHEXIDINE GLUCONATE CLOTH 2 % EX PADS
6.0000 | MEDICATED_PAD | Freq: Every day | CUTANEOUS | Status: DC
  Administered 2023-11-18 – 2023-11-22 (×5): 6 via TOPICAL

## 2023-11-18 MED ORDER — SODIUM CHLORIDE 0.9% IV SOLUTION: Freq: Once | INTRAVENOUS | Status: AC

## 2023-11-18 MED ORDER — PANTOPRAZOLE SODIUM 40 MG IV SOLR
40.0000 mg | Freq: Two times a day (BID) | INTRAVENOUS | Status: DC
  Administered 2023-11-18 – 2023-11-21 (×8): 40 mg via INTRAVENOUS
  Filled 2023-11-18 (×8): qty 10

## 2023-11-18 MED ORDER — ONDANSETRON HCL 4 MG PO TABS: 4.0000 mg | ORAL_TABLET | Freq: Four times a day (QID) | ORAL | Status: DC | PRN

## 2023-11-18 MED ORDER — ONDANSETRON HCL 4 MG/2ML IJ SOLN: 4.0000 mg | Freq: Four times a day (QID) | INTRAMUSCULAR | Status: DC | PRN

## 2023-11-18 MED ORDER — PANTOPRAZOLE SODIUM 40 MG IV SOLR
80.0000 mg | Freq: Once | INTRAVENOUS | Status: AC
  Administered 2023-11-18: 80 mg via INTRAVENOUS
  Filled 2023-11-18: qty 20

## 2023-11-18 SURGICAL SUPPLY — 1 items: TOWEL COTTON PACK 4EA (MISCELLANEOUS) ×4 IMPLANT

## 2023-11-18 NOTE — ED Notes (Signed)
 First unit of blood complete. Pt states that she needs to be changed. Bloody stool noted in brief. Cleaned pt and put new adult diaper.

## 2023-11-18 NOTE — Progress Notes (Signed)
 [facility] capsule endoscopy ordered by MD Honey Lusty.  Patient ingested capsule at 0953  .  Per Given's capsule instructions, patient to remain NPO until 1153 at which time they may progress to clear liquid diet. At 1353 patient may have a small snack such as a half a sandwich or a bowl of soup. At 1753 patient may progress to previously ordered diet.  The capsule endoscopy study will conclude at 2153 at which time the recorder and leads or belt can be removed and placed in a patient belongings bag. Endoscopy staff will pick up the equipment in the AM.  Instructions provided to patient and inpatient RN, Thurston Flow. Patient and RN demonstrated understanding.

## 2023-11-18 NOTE — ED Notes (Signed)
 Date and time results received: 11/18/23 3:03 AM  (use smartphrase ".now" to insert current time)  Test: Hemoglobin Critical Value: 5.3  Name of Provider Notified: Countryman  Orders Received? Or Actions Taken?: Orders Received - See Orders for details

## 2023-11-18 NOTE — Consult Note (Signed)
 Referring Provider: Dr. Bonita Bussing Primary Care Physician:  Arnetha Bhat, NP Primary Gastroenterologist:  Dr. Lavaughn Portland  Reason for Consultation:  GI bleed  HPI: [name] is a 83 y.o. female with history of recurrent GI bleeding and recent admit for GI bleed last month with colonoscopy showing acute colitis in the proximal colon and 2 adenomas were removed and EGD showing gastritis and gastric polyps. Capsule endoscopy in 2024 negative for small bowel findings. 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 Hgb 5.3 (9.4 on 10/27/23). Heme positive. Right-sided abdominal pain but cannot tell me how long or what triggers she has noticed for the abdominal pain.  Past Medical History:  Diagnosis Date   Anxiety    Arthritis    "bilateral knees, shoulders, elbows; neck, pretty widespread" (09/05/2017)   BPPV (benign paroxysmal positional vertigo)    Depression    GERD (gastroesophageal reflux disease)    Glaucoma, both eyes    Headache    "probably 2/month" (09/05/2017)   History of blood transfusion ~ 2008   "related to LGIB"   Hyperlipemia    Lower GI bleeding ~ 2008; 09/05/2017   "had to have blood transfusion"   Macular degeneration, bilateral    Osteopenia    Seasonal allergies    Skin cancer, basal cell 2001   "off my nose, left side"   Sleeping excessive    Tinnitus of both ears     Past Surgical History:  Procedure Laterality Date   BALLOON DILATION N/A 05/08/2021   Procedure: BALLOON DILATION;  Surgeon: Genell Ken, MD;  Location: [facility] ENDOSCOPY;  Service: Gastroenterology;  Laterality: N/A;   BASAL CELL CARCINOMA EXCISION  2001   "off my nose, left side"   BIOPSY  05/08/2021   Procedure: BIOPSY;  Surgeon: Genell Ken, MD;  Location: [facility] ENDOSCOPY;  Service: Gastroenterology;;   BIOPSY  04/25/2023   Procedure: BIOPSY;  Surgeon: Baldo Bonds, MD;  Location: [facility] ENDOSCOPY;  Service: Gastroenterology;;   BLEPHAROPLASTY Bilateral     BONE BIOPSY  10/26/2023   Procedure: BIOPSY, GI;  Surgeon: Felecia Hopper, MD;  Location: [facility] ENDOSCOPY;  Service: Gastroenterology;;   CATARACT EXTRACTION W/ INTRAOCULAR LENS  IMPLANT, BILATERAL Bilateral 1990's   COLONOSCOPY N/A 10/26/2023   Procedure: COLONOSCOPY;  Surgeon: Felecia Hopper, MD;  Location: [facility] ENDOSCOPY;  Service: Gastroenterology;  Laterality: N/A;   COLONOSCOPY WITH PROPOFOL  N/A 05/04/2022   Procedure: COLONOSCOPY WITH PROPOFOL ;  Surgeon: Genell Ken, MD;  Location: [facility] ENDOSCOPY;  Service: Gastroenterology;  Laterality: N/A;   COLONOSCOPY WITH PROPOFOL  N/A 04/25/2023   Procedure: COLONOSCOPY WITH PROPOFOL ;  Surgeon: Baldo Bonds, MD;  Location: [facility] ENDOSCOPY;  Service: Gastroenterology;  Laterality: N/A;   ESOPHAGOGASTRODUODENOSCOPY N/A 10/26/2023   Procedure: EGD (ESOPHAGOGASTRODUODENOSCOPY);  Surgeon: Felecia Hopper, MD;  Location: [facility] ENDOSCOPY;  Service: Gastroenterology;  Laterality: N/A;   ESOPHAGOGASTRODUODENOSCOPY (EGD) WITH PROPOFOL  N/A 05/08/2021   Procedure: ESOPHAGOGASTRODUODENOSCOPY (EGD) WITH PROPOFOL ;  Surgeon: Genell Ken, MD;  Location: [facility] ENDOSCOPY;  Service: Gastroenterology;  Laterality: N/A;   ESOPHAGOGASTRODUODENOSCOPY (EGD) WITH PROPOFOL  N/A 01/11/2022   Procedure: ESOPHAGOGASTRODUODENOSCOPY (EGD) WITH PROPOFOL ;  Surgeon: Ozell Blunt, MD;  Location: [facility] ENDOSCOPY;  Service: Gastroenterology;  Laterality: N/A;   ESOPHAGOGASTRODUODENOSCOPY (EGD) WITH PROPOFOL  N/A 01/06/2023   Procedure: ESOPHAGOGASTRODUODENOSCOPY (EGD) WITH PROPOFOL ;  Surgeon: Ozell Blunt, MD;  Location: [facility] ENDOSCOPY;  Service: Gastroenterology;  Laterality: N/A;   ESOPHAGOGASTRODUODENOSCOPY (EGD) WITH PROPOFOL  N/A 04/21/2023   Procedure: ESOPHAGOGASTRODUODENOSCOPY (EGD) WITH PROPOFOL ;  Surgeon: Baldo Bonds, MD;  Location: [facility] ENDOSCOPY;  Service: Gastroenterology;  Laterality: N/A;   EYE SURGERY Bilateral    "to improve vision after cataract OR"   GIVENS CAPSULE STUDY N/A  04/21/2023   Procedure: GIVENS CAPSULE STUDY;  Surgeon: Baldo Bonds, MD;  Location: [facility] ENDOSCOPY;  Service: Gastroenterology;  Laterality: N/A;   HEMOSTASIS CLIP PLACEMENT  10/26/2023   Procedure: CONTROL OF HEMORRHAGE, GI TRACT, ENDOSCOPIC, BY CLIPPING OR OVERSEWING;  Surgeon: Felecia Hopper, MD;  Location: [facility] ENDOSCOPY;  Service: Gastroenterology;;   IR ANGIOGRAM FOLLOW UP STUDY  12/19/2020   IR ANGIOGRAM SELECTIVE EACH ADDITIONAL VESSEL  12/19/2020   IR ANGIOGRAM SELECTIVE EACH ADDITIONAL VESSEL  12/19/2020   IR ANGIOGRAM VISCERAL SELECTIVE  12/19/2020   IR EMBO ART  VEN HEMORR LYMPH EXTRAV  INC GUIDE ROADMAPPING  12/19/2020   IR US  GUIDE VASC ACCESS RIGHT  12/19/2020   JOINT REPLACEMENT     POLYPECTOMY  05/04/2022   Procedure: POLYPECTOMY;  Surgeon: Genell Ken, MD;  Location: [facility] ENDOSCOPY;  Service: Gastroenterology;;   POLYPECTOMY  10/26/2023   Procedure: POLYPECTOMY, INTESTINE;  Surgeon: Felecia Hopper, MD;  Location: [facility] ENDOSCOPY;  Service: Gastroenterology;;   STAPEDES SURGERY Left    "scraped stapedes because it was sticking when it wasn't suppose to"   TONSILLECTOMY AND ADENOIDECTOMY  1946   TOTAL KNEE ARTHROPLASTY Left ~ 2008   TOTAL KNEE ARTHROPLASTY  12/20/2011   Procedure: TOTAL KNEE ARTHROPLASTY;  Surgeon: Lorriane Rote, MD;  Location: [facility] OR;  Service: Orthopedics;  Laterality: Right;  Right Total Knee Arthroplasty   TUBAL LIGATION  1980's    Prior to Admission medications   Medication Sig Start Date End Date Taking? Authorizing Provider  acetaminophen  (TYLENOL ) 500 MG tablet Take 500 mg by mouth daily as needed for moderate pain (pain score 4-6).   Yes [provider]  acetaminophen  (TYLENOL ) 650 MG CR tablet Take 1,300 mg by mouth in the morning and at bedtime.   Yes [provider]  albuterol  (VENTOLIN  HFA) 108 (90 Base) MCG/ACT inhaler Inhale 2 puffs into the lungs every 4 (four) hours as needed for wheezing or shortness of breath.   Yes [provider]  alendronate (FOSAMAX) 70 MG tablet Take 70 mg by mouth every Sunday. Take with a full glass of water on an empty stomach.   Yes [provider]  Ascorbic Acid  (VITAMIN C ) 1000 MG tablet Take 1,000 mg by mouth every morning.   Yes [provider]  cetirizine  (ZYRTEC ) 5 MG chewable tablet Chew 5 mg by mouth daily as needed for allergies.   Yes [provider]  Cholecalciferol  (VITAMIN D3) 50 MCG (2000 UT) TABS Take 2,000 Units by mouth every morning.   Yes [provider]  cyanocobalamin  (VITAMIN B12) 1000 MCG tablet Take 1,000 mcg by mouth daily.   Yes [provider]  docusate sodium  (COLACE) 100 MG capsule Take 200 mg by mouth at bedtime.   Yes [provider]  Dorzolamide  HCl-Timolol  Mal PF 2-0.5 % SOLN Place 1 drop into both eyes in the morning and at bedtime.   Yes [provider]  ferrous gluconate  (FERGON) 240 (27 FE) MG tablet Take 240 mg by mouth every morning. Take without food   Yes [provider]  fluticasone  (FLONASE ) 50 MCG/ACT nasal spray Place 1 spray into both nostrils daily as needed for allergies or rhinitis.   Yes [provider]  guaiFENesin  (ROBITUSSIN) 100 MG/5ML liquid Take 10 mLs by mouth  every 4 (four) hours as needed for cough or to loosen phlegm. 04/28/23  Yes Rai, Ripudeep K, MD  latanoprost  (XALATAN ) 0.005 % ophthalmic solution Place 1 drop into both eyes at bedtime.   Yes [provider]  Multiple Vitamins-Minerals (PRESERVISION AREDS 2) CAPS Take 1 capsule by mouth 2 (two) times daily.   Yes [provider]  Netarsudil  Dimesylate (RHOPRESSA ) 0.02 % SOLN Place 1 drop into both eyes at bedtime.   Yes [provider]  oxybutynin  (DITROPAN -XL) 10 MG 24 hr tablet Take 10 mg by mouth at bedtime.   Yes [provider]  pantoprazole  (PROTONIX ) 40 MG tablet Take 40 mg by mouth 2 (two) times daily.   Yes [provider]  polyethylene  glycol (MIRALAX  / GLYCOLAX ) 17 g packet Take 8.5 g by mouth daily as needed (constipation).   Yes [provider]  potassium chloride  SA (KLOR-CON  M) 20 MEQ tablet Take 20 mEq by mouth once.   Yes [provider]  Probiotic Product (ALIGN) 4 MG CAPS Take 4 mg by mouth at bedtime.   Yes [provider]  rOPINIRole  (REQUIP ) 0.5 MG tablet Take 1 tablet (0.5 mg total) by mouth daily as needed. 04/30/23  Yes [name], [name], NP  sertraline  (ZOLOFT ) 50 MG tablet Take 1.5 tablets (75 mg total) by mouth daily. 01/07/23  Yes Samtani, Jai-Gurmukh, MD  simvastatin  (ZOCOR ) 20 MG tablet Take 20 mg by mouth every evening.   Yes [provider]  Sodium Fluoride  (PREVIDENT 5000 BOOSTER PLUS) 1.1 % PSTE Place 1 Application onto teeth See admin instructions. Brush on teeth with a toothbrush after evening mouth care. Spit out excess and do not rinse.   Yes [provider]  torsemide  (DEMADEX ) 20 MG tablet Take 20 mg by mouth daily.   Yes [provider]  umeclidinium-vilanterol (ANORO ELLIPTA ) 62.5-25 MCG/ACT AEPB Inhale 1 puff into the lungs every morning.   Yes [provider]  zinc oxide 20 % ointment Apply 1 Application topically See admin instructions. Apply topically to buttocks after every incontinent episode and as needed for redness   Yes [provider]  amoxicillin -clavulanate (AUGMENTIN ) 875-125 MG tablet Take 1 tablet by mouth every 12 (twelve) hours. Patient not taking: Reported on 11/18/2023 10/27/23   Lonita Roach, MD    Scheduled Meds:  sodium chloride    Intravenous Once   pantoprazole  (PROTONIX ) IV  40 mg Intravenous Q12H   Continuous Infusions: PRN Meds:.acetaminophen  **OR** acetaminophen , ondansetron  **OR** ondansetron  (ZOFRAN ) IV  Allergies as of 11/18/2023 - Review Complete 11/18/2023  Allergen Reaction Noted   Morphine and codeine Itching 11/29/2011   Aspirin Other (See Comments) 10/22/2020   Duloxetine  hcl Other (See  Comments) 10/22/2020   Tymlos [abaloparatide] Other (See Comments) 10/22/2020   Ventolin  [albuterol ] Other (See Comments) 12/25/2020   Cefadroxil Hives 12/11/2011    Family History  Problem Relation Age of Onset   Heart attack Mother    Heart disease Father    Heart failure Father    Bladder Cancer Father    Supraventricular tachycardia Sister    Hypertension Sister     Social History   Socioeconomic History   Marital status: Married    Spouse name: Josiah Nigh    Number of children: 3   Years of education: College   Highest education level: Not on file  Occupational History   Occupation: Retired Runner, broadcasting/film/video  Tobacco Use   Smoking status: Former    Current packs/day: 0.00    Average  packs/day: 0.8 packs/day for 37.0 years (27.8 ttl pk-yrs)    Types: Cigarettes    Start date: 07/02/1955    Quit date: 07/01/1992    Years since quitting: 31.4    Passive exposure: Never   Smokeless tobacco: Never  Vaping Use   Vaping status: Never Used  Substance and Sexual Activity   Alcohol  use: Yes    Alcohol /week: 7.0 standard drinks of alcohol     Types: 7 Glasses of wine per week    Comment: 09/05/2017  "6 oz wine, 5 days/wk"   Drug use: No    Types: Hydrocodone    Sexual activity: Not Currently  Other Topics Concern   Not on file  Social History Narrative   1-2 cups of coffee a day    Social Drivers of Health   Financial Resource Strain: Low Risk  (04/04/2023)   Overall Financial Resource Strain (CARDIA)    Difficulty of Paying Living Expenses: Not hard at all  Food Insecurity: No Food Insecurity (10/25/2023)   Hunger Vital Sign    Worried About Running Out of Food in the Last Year: Never true    Ran Out of Food in the Last Year: Never true  Transportation Needs: No Transportation Needs (10/25/2023)   PRAPARE - Administrator, Civil Service (Medical): No    Lack of Transportation (Non-Medical): No  Physical Activity: Insufficiently Active (04/04/2023)   Exercise Vital Sign     Days of Exercise per Week: 2 days    Minutes of Exercise per Session: 20 min  Stress: No Stress Concern Present (04/04/2023)   [employer] of Occupational Health - Occupational Stress Questionnaire    Feeling of Stress : Only a little  Social Connections: Socially Integrated (10/25/2023)   Social Connection and Isolation Panel [NHANES]    Frequency of Communication with Friends and Family: More than three times a week    Frequency of Social Gatherings with Friends and Family: More than three times a week    Attends Religious Services: 1 to 4 times per year    Active Member of [employer] or Organizations: Yes    Attends Banker Meetings: 1 to 4 times per year    Marital Status: Married  Catering manager Violence: Not At Risk (10/25/2023)   Humiliation, Afraid, Rape, and Kick questionnaire    Fear of Current or Ex-Partner: No    Emotionally Abused: No    Physically Abused: No    Sexually Abused: No    Review of Systems: All negative except as stated above in HPI.  Physical Exam: Vital signs: Vitals:   11/18/23 0730 11/18/23 0745  BP: (!) 96/50 (!) 112/54  Pulse: 84 88  Resp: (!) 21 (!) 21  Temp:    SpO2: 98% 97%  T 98   General:  Lethargic, chronically ill-appearing, elderly, well-nourished, no acute distress  Head: normocephalic, atraumatic Eyes: anicteric sclera ENT: oropharynx clear Neck: supple, nontender Lungs:  Clear throughout to auscultation.   No wheezes, crackles, or rhonchi. No acute distress. Heart:  Regular rate and rhythm; no murmurs, clicks, rubs,  or gallops. Abdomen: soft, nontender, nondistended, +BS  Rectal:  Deferred Ext: no edema  GI:  Lab Results: Recent Labs    11/18/23 0210  WBC 10.3  HGB 5.3*  HCT 17.0*  PLT 179   BMET Recent Labs    11/18/23 0210  NA 135  K 3.4*  CL 101  CO2 24  GLUCOSE 153*  BUN 28*  CREATININE 0.60  CALCIUM  8.0*   LFT Recent Labs    11/18/23 0210  PROT 5.5*  ALBUMIN 3.3*  AST 15  ALT 10   ALKPHOS 40  BILITOT 0.7   PT/INR No results for input(s): "LABPROT", "INR" in the last 72 hours.   Studies/Results: No results found.  Impression/Plan: Obscure GI bleeding - recent colon/EGD. Will do updated capsule endoscopy today. Transfused. Follow H/Hs. NPO until capsule endoscopy and then follow post-capsule instructions. Supportive care.    LOS: 0 days   [name]  11/18/2023, 9:03 AM  Questions please call [phone]

## 2023-11-18 NOTE — ED Triage Notes (Signed)
 Admitted from [facility] for Hx of GI bleed, lab draw revealed hg of 5.8. BP 132/78 HR 110; O2 98% RA, cbg 180

## 2023-11-18 NOTE — Plan of Care (Signed)
  Problem: Clinical Measurements: Goal: Will remain free from infection Outcome: Not Progressing   Problem: Nutrition: Goal: Adequate nutrition will be maintained Outcome: Not Progressing   Problem: Elimination: Goal: Will not experience complications related to bowel motility Outcome: Not Progressing Goal: Will not experience complications related to urinary retention Outcome: Not Progressing

## 2023-11-18 NOTE — ED Provider Notes (Signed)
  EMERGENCY DEPARTMENT AT [facility] Provider Note   CSN: [id number] Arrival date & time: 11/18/23  0130     History  Chief Complaint  Patient presents with   GI Bleeding    [name] is a 83 y.o. female presents to the emergency department via EMS with chief complaint of abnormal labs.  Patient has a known history of GI bleeds which is being followed by outpatient lab work.  Patient had a recent upper endoscopy completed on 10/26/2023 as well as a colonoscopy completed on 10/26/2023.  Patient transported to the emergency department due to a low hemoglobin lab of 5.8 on outpatient lab work.  Patient states that for the past 2 days she has noticed dark stools, but denies any other symptoms.  Denies fever, chills, chest pain, shortness of breath, nausea, vomiting, diarrhea.  Patient states that her last bowel movement was last night.  Patient lives at an assisted living facility. Past medical history significant for lower GI bleed, diverticulosis of colon, upper GI bleed.   HPI     Home Medications Prior to Admission medications   Medication Sig Start Date End Date Taking? Authorizing Provider  acetaminophen  (TYLENOL ) 500 MG tablet Take 500 mg by mouth daily as needed for moderate pain (pain score 4-6).    [provider]  acetaminophen  (TYLENOL ) 650 MG CR tablet Take 1,300 mg by mouth in the morning and at bedtime.    [provider]  albuterol  (VENTOLIN  HFA) 108 (90 Base) MCG/ACT inhaler Inhale 2 puffs into the lungs every 4 (four) hours as needed for wheezing or shortness of breath.    [provider]  alendronate (FOSAMAX) 70 MG tablet Take 70 mg by mouth every Sunday. Take with a full glass of water on an empty stomach.    [provider]  amoxicillin -clavulanate (AUGMENTIN ) 875-125 MG tablet Take 1 tablet by mouth every 12 (twelve) hours. 10/27/23   Lonita Roach, MD  Ascorbic Acid  (VITAMIN C ) 1000 MG tablet Take 1,000 mg  by mouth every morning.    [provider]  cetirizine  (ZYRTEC ) 5 MG chewable tablet Chew 5 mg by mouth daily as needed for allergies.    [provider]  Cholecalciferol  (VITAMIN D3) 50 MCG (2000 UT) TABS Take 2,000 Units by mouth every morning.    [provider]  cyanocobalamin  (VITAMIN B12) 1000 MCG tablet Take 1,000 mcg by mouth daily.    [provider]  docusate sodium  (COLACE) 100 MG capsule Take 200 mg by mouth at bedtime.    [provider]  Dorzolamide  HCl-Timolol  Mal PF 2-0.5 % SOLN Place 1 drop into both eyes in the morning and at bedtime.    [provider]  ferrous gluconate  (FERGON) 240 (27 FE) MG tablet Take 240 mg by mouth every morning. Take without food    [provider]  fluticasone  (FLONASE ) 50 MCG/ACT nasal spray Place 1 spray into both nostrils daily as needed for allergies or rhinitis.    [provider]  guaiFENesin  (ROBITUSSIN) 100 MG/5ML liquid Take 10 mLs by mouth every 4 (four) hours as needed for cough or to loosen phlegm. 04/28/23   Rai, Ripudeep K, MD  latanoprost  (XALATAN ) 0.005 % ophthalmic solution Place 1 drop into both eyes at bedtime.    [provider]  Multiple Vitamins-Minerals (PRESERVISION AREDS 2) CAPS Take 1 capsule by mouth 2 (two) times daily.    [provider]  Netarsudil  Dimesylate (RHOPRESSA ) 0.02 %  SOLN Place 1 drop into both eyes at bedtime.    [provider]  oxybutynin  (DITROPAN -XL) 10 MG 24 hr tablet Take 10 mg by mouth at bedtime.    [provider]  pantoprazole  (PROTONIX ) 40 MG tablet Take 40 mg by mouth 2 (two) times daily.    [provider]  polyethylene glycol (MIRALAX  / GLYCOLAX ) 17 g packet Take 8.5 g by mouth daily as needed (constipation).    [provider]  potassium chloride  SA (KLOR-CON  M) 20 MEQ tablet Take 20 mEq by mouth once.    [provider]  Probiotic Product (ALIGN) 4 MG CAPS Take 4 mg  by mouth at bedtime.    [provider]  rOPINIRole  (REQUIP ) 0.5 MG tablet Take 1 tablet (0.5 mg total) by mouth daily as needed. 04/30/23   [name], [name], NP  sertraline  (ZOLOFT ) 50 MG tablet Take 1.5 tablets (75 mg total) by mouth daily. 01/07/23   Samtani, Jai-Gurmukh, MD  simvastatin  (ZOCOR ) 20 MG tablet Take 20 mg by mouth every evening.    [provider]  Sodium Fluoride  (PREVIDENT 5000 BOOSTER PLUS) 1.1 % PSTE Place 1 Application onto teeth See admin instructions. Brush on teeth with a toothbrush after evening mouth care. Spit out excess and do not rinse.    [provider]  torsemide  (DEMADEX ) 20 MG tablet Take 20 mg by mouth daily.    [provider]  umeclidinium-vilanterol (ANORO ELLIPTA ) 62.5-25 MCG/ACT AEPB Inhale 1 puff into the lungs every morning.    [provider]  zinc oxide 20 % ointment Apply 1 Application topically See admin instructions. Apply topically to buttocks after every incontinent episode and as needed for redness    [provider]      Allergies    Morphine and codeine, Aspirin, Duloxetine  hcl, Tymlos [abaloparatide], Ventolin  [albuterol ], and Cefadroxil    Review of Systems   Review of Systems  Constitutional:  Positive for fatigue. Negative for chills and fever.  HENT:  Positive for congestion.   Respiratory:  Negative for chest tightness and shortness of breath.   Cardiovascular:  Negative for chest pain.  Gastrointestinal:  Positive for blood in stool. Negative for abdominal distention, abdominal pain, diarrhea, nausea and vomiting.  Skin:  Negative for rash and wound.  Allergic/Immunologic: Positive for environmental allergies.    Physical Exam Updated Vital Signs BP (!) 118/57   Pulse (!) 106   Temp 98.2 F (36.8 C) (Oral)   Resp (!) 22   Ht 5\' 1"  (1.549 m)   Wt 77.2 kg   SpO2 98%   BMI 32.16 kg/m  Physical Exam Vitals and nursing note reviewed. Exam conducted with a chaperone present.   Constitutional:      General: She is awake. She is not in acute distress.    Appearance: Normal appearance. She is not ill-appearing, toxic-appearing or diaphoretic.  HENT:     Head: Normocephalic and atraumatic.  Cardiovascular:     Rate and Rhythm: Normal rate and regular rhythm.  Pulmonary:     Effort: Pulmonary effort is normal. No respiratory distress.     Breath sounds: Normal breath sounds. No wheezing, rhonchi or rales.  Abdominal:     General: Abdomen is flat. There is no distension.     Palpations: Abdomen is soft.     Tenderness: There is no abdominal tenderness. There is no guarding or rebound.  Genitourinary:    Comments: Dried red blood and melena present on rectal exam  Skin:    General: Skin is warm and dry.     Capillary Refill: Capillary refill takes less than 2 seconds.  Neurological:     General: No focal deficit present.     Mental Status: She is alert and oriented to person, place, and time.  Psychiatric:        Behavior: Behavior is cooperative.     ED Results / Procedures / Treatments   Labs (all labs ordered are listed, but only abnormal results are displayed) Labs Reviewed  CBC WITH DIFFERENTIAL/PLATELET - Abnormal; Notable for the following components:      Result Value   RBC 1.64 (*)    Hemoglobin 5.3 (*)    HCT 17.0 (*)    MCV 103.7 (*)    RDW 16.5 (*)    nRBC 0.5 (*)    All other components within normal limits  COMPREHENSIVE METABOLIC PANEL WITH GFR - Abnormal; Notable for the following components:   Potassium 3.4 (*)    Glucose, Bld 153 (*)    BUN 28 (*)    Calcium  8.0 (*)    Total Protein 5.5 (*)    Albumin 3.3 (*)    All other components within normal limits  POC OCCULT BLOOD, ED - Abnormal; Notable for the following components:   Fecal Occult Bld POSITIVE (*)    All other components within normal limits  TYPE AND SCREEN  PREPARE RBC (CROSSMATCH)    EKG None  Radiology No results found.  Procedures .Critical  Care  Performed by: Fonda Hymen, PA-C Authorized by: Fonda Hymen, PA-C   Critical care provider statement:    Critical care time (minutes):  30   Critical care time was exclusive of:  Separately billable procedures and treating other patients   Critical care was necessary to treat or prevent imminent or life-threatening deterioration of the following conditions:  Circulatory failure (hemoglobin 5.3 requiring transfusion)   Critical care was time spent personally by me on the following activities:  Development of treatment plan with patient or surrogate, discussions with consultants, evaluation of patient's response to treatment, examination of patient, ordering and review of laboratory studies, ordering and review of radiographic studies, ordering and performing treatments and interventions, pulse oximetry, re-evaluation of patient's condition and review of old charts   Care discussed with: admitting provider       Medications Ordered in ED Medications  0.9 %  sodium chloride  infusion (Manually program via Guardrails IV Fluids) (has no administration in time range)  pantoprazole  (PROTONIX ) injection 80 mg (80 mg Intravenous Given 11/18/23 0428)    ED Course/ Medical Decision Making/ A&P Clinical Course as of 11/18/23 0528  Tue Nov 18, 2023  0406 6 YOF with a chief complaint melena. You actually scoped recently for similar. HGB 5.3 getting transfused and admitted to medicine. Gave protonix . Hemodynamically stable at this time. Planning medical admit with GI eval in AM   [CC]    Clinical Course User Index [CC] Onetha Bile, MD    Patient presents to the ED for concern of abnormal hemoglobin with known history of GI bleeds, this involves an extensive number of treatment options, and is a complaint that carries with it a high risk of complications and morbidity.  The differential diagnosis includes GI bleed, lower GI bleed, stomach ulcers, esophageal varices, diverticulosis,  internal/external hemorrhoids, anal fissure, etc.   Co morbidities that complicate the patient evaluation  History of GI bleed   Lab Tests:  I Ordered, and  personally interpreted labs.  The pertinent results include: CBC-hemoglobin 5.3, RBC 1.64, HCT 17, MCV 103.7, RDW 16.5, and RBC 0.5 Hemmocult - positive CMP-potassium 3.4, glucose 153, BUN 28, calcium  8, total protein 5.5, albumin 3.3 Type and screen - O positive   Medicines ordered and prescription drug management:  I ordered medication including Protonix  for suspected GI bleed, blood transfusion for symptomatic anemia in the presence of suspected GI bleed Reevaluation of the patient after these medicines showed that the patient stayed the same I have reviewed the patients home medicines and have made adjustments as needed   Test Considered:  CTA-client at this time, on exam no active bleeding of any volume noted, will reassess after blood transfusion   Critical Interventions:  Transfusion for symptomatic anemia   Consultations Obtained:  I requested consultation with the GI team, and discussed lab and imaging findings as well as pertinent plan - they recommend: admission   Problem List / ED Course:  Abnormal labs-hemoglobin 5.8 outpatient Lab work repeated today in the emergency department significant for hemoglobin of 5.3, known history of GI bleed, recent upper endoscopy and colonoscopy, ischemic colitis noted Due to symptomatic anemia blood transfusion ordered, Protonix  also given for suspected GI bleed Hemoccult positive, rectal exam significant for dried red blood and melena in rectal vault, no active bleeding at time of exam. GI consulted, known [facility] patient Order for admission placed, consult with hospitalist completed Bed requested    Reevaluation:  After the interventions noted above, I reevaluated the patient and found that they have :stayed the same   Social Determinants of Health:  Assisted  living facility resident   Dispostion:  After consideration of the diagnostic results and the patients response to treatment, I feel that the patent would benefit from admission to the hospital and ongoing diagnosis and treatment by the hospitalist team and GI.   Click here for ABCD2, HEART and other calculatorsREFRESH Note before signing :1}                              Medical Decision Making Amount and/or Complexity of Data Reviewed Labs: ordered.  Risk Prescription drug management. Decision regarding hospitalization.         Final Clinical Impression(s) / ED Diagnoses Final diagnoses:  Melena  Symptomatic anemia    Rx / DC Orders ED Discharge Orders     None         Fonda Hymen, PA-C 11/18/23 0530    Onetha Bile, MD 11/19/23 0009

## 2023-11-18 NOTE — H&P (Signed)
 History and Physical    Patient: [name]:[id number] DOB: 02/22/1941 DOA: 11/18/2023 DOS: the patient was seen and examined on 11/18/2023 PCP: Arnetha Bhat, NP  Patient coming from: Home  Chief Complaint:  Chief Complaint  Patient presents with   GI Bleeding   HPI: [name] is a 83 y.o. female with medical history significant of anxiety, depression, chronic diastolic heart failure, osteoarthritis, glaucoma, hyperlipidemia, hypertension, pulmonary nodule, restless leg syndrome, osteoporosis, COPD, aortic arthrosclerosis, GERD, history of diverticular bleed with embolization, history of UGI bleed, chronic iron  deficiency anemia due to chronic blood loss with multiple admissions due to GI bleed most recently seen from 03/27/2023 until 03/29/2023 with recent EGD in August 2024 that showed gastritis. I admitted her from 04/19/2023 until 04/29/2023 for GI bleed, also readmitted for bleeding last month from 10/24/2022 until 10/27/2022 who is coming to the ED due to multiple hematochezia episodes.  No emesis.  He denied fever, chills, rhinorrhea, sore throat, wheezing or hemoptysis.  No chest pain, palpitations, diaphoresis, PND, orthopnea or pitting edema of the lower extremities. No flank pain, dysuria, frequency or hematuria.  No polyuria, polydipsia, polyphagia or blurred vision.   Lab work: CBC showed a white count of 10.3, hemoglobin 5.3 g/dL platelets 811.  Fecal occult blood was positive.  CMP showed a potassium of 3.4 mmol/L.  Glucose 153, BUN 28, creatinine 0.60 and corrected calcium  8.6 mg/dL.  Total protein is 5.5 and albumin 3.3 g/dL.  The rest of the hepatic functions and the rest of the electrolytes were normal.   ED course: Initial vital signs were temperature 98.3 F, pulse 99, respiration 18, BP 134/61 mmHg O2 sat 97% on room air.  The patient received pantoprazole  80 mg IVP x 1 and 2 units of PRBC.  Review of Systems: As mentioned in the history of present illness. All other  systems reviewed and are negative.  Past Medical History:  Diagnosis Date   Anxiety    Arthritis    "bilateral knees, shoulders, elbows; neck, pretty widespread" (09/05/2017)   BPPV (benign paroxysmal positional vertigo)    Depression    GERD (gastroesophageal reflux disease)    Glaucoma, both eyes    Headache    "probably 2/month" (09/05/2017)   History of blood transfusion ~ 2008   "related to LGIB"   Hyperlipemia    Lower GI bleeding ~ 2008; 09/05/2017   "had to have blood transfusion"   Macular degeneration, bilateral    Osteopenia    Seasonal allergies    Skin cancer, basal cell 2001   "off my nose, left side"   Sleeping excessive    Tinnitus of both ears    Past Surgical History:  Procedure Laterality Date   BALLOON DILATION N/A 05/08/2021   Procedure: BALLOON DILATION;  Surgeon: Genell Ken, MD;  Location: [facility] ENDOSCOPY;  Service: Gastroenterology;  Laterality: N/A;   BASAL CELL CARCINOMA EXCISION  2001   "off my nose, left side"   BIOPSY  05/08/2021   Procedure: BIOPSY;  Surgeon: Genell Ken, MD;  Location: [facility] ENDOSCOPY;  Service: Gastroenterology;;   BIOPSY  04/25/2023   Procedure: BIOPSY;  Surgeon: Baldo Bonds, MD;  Location: [facility] ENDOSCOPY;  Service: Gastroenterology;;   BLEPHAROPLASTY Bilateral    BONE BIOPSY  10/26/2023   Procedure: BIOPSY, GI;  Surgeon: Felecia Hopper, MD;  Location: [facility] ENDOSCOPY;  Service: Gastroenterology;;   CATARACT EXTRACTION W/ INTRAOCULAR LENS  IMPLANT, BILATERAL Bilateral 1990's   COLONOSCOPY N/A 10/26/2023   Procedure: COLONOSCOPY;  Surgeon: Felecia Hopper, MD;  Location: [facility] ENDOSCOPY;  Service: Gastroenterology;  Laterality: N/A;   COLONOSCOPY WITH PROPOFOL  N/A 05/04/2022   Procedure: COLONOSCOPY WITH PROPOFOL ;  Surgeon: Genell Ken, MD;  Location: [facility] ENDOSCOPY;  Service: Gastroenterology;  Laterality: N/A;   COLONOSCOPY WITH PROPOFOL  N/A 04/25/2023   Procedure: COLONOSCOPY WITH PROPOFOL ;  Surgeon: Baldo Bonds, MD;  Location:  [facility] ENDOSCOPY;  Service: Gastroenterology;  Laterality: N/A;   ESOPHAGOGASTRODUODENOSCOPY N/A 10/26/2023   Procedure: EGD (ESOPHAGOGASTRODUODENOSCOPY);  Surgeon: Felecia Hopper, MD;  Location: [facility] ENDOSCOPY;  Service: Gastroenterology;  Laterality: N/A;   ESOPHAGOGASTRODUODENOSCOPY (EGD) WITH PROPOFOL  N/A 05/08/2021   Procedure: ESOPHAGOGASTRODUODENOSCOPY (EGD) WITH PROPOFOL ;  Surgeon: Genell Ken, MD;  Location: [facility] ENDOSCOPY;  Service: Gastroenterology;  Laterality: N/A;   ESOPHAGOGASTRODUODENOSCOPY (EGD) WITH PROPOFOL  N/A 01/11/2022   Procedure: ESOPHAGOGASTRODUODENOSCOPY (EGD) WITH PROPOFOL ;  Surgeon: Ozell Blunt, MD;  Location: [facility] ENDOSCOPY;  Service: Gastroenterology;  Laterality: N/A;   ESOPHAGOGASTRODUODENOSCOPY (EGD) WITH PROPOFOL  N/A 01/06/2023   Procedure: ESOPHAGOGASTRODUODENOSCOPY (EGD) WITH PROPOFOL ;  Surgeon: Ozell Blunt, MD;  Location: [facility] ENDOSCOPY;  Service: Gastroenterology;  Laterality: N/A;   ESOPHAGOGASTRODUODENOSCOPY (EGD) WITH PROPOFOL  N/A 04/21/2023   Procedure: ESOPHAGOGASTRODUODENOSCOPY (EGD) WITH PROPOFOL ;  Surgeon: Baldo Bonds, MD;  Location: [facility] ENDOSCOPY;  Service: Gastroenterology;  Laterality: N/A;   EYE SURGERY Bilateral    "to improve vision after cataract OR"   GIVENS CAPSULE STUDY N/A 04/21/2023   Procedure: GIVENS CAPSULE STUDY;  Surgeon: Baldo Bonds, MD;  Location: [facility] ENDOSCOPY;  Service: Gastroenterology;  Laterality: N/A;   HEMOSTASIS CLIP PLACEMENT  10/26/2023   Procedure: CONTROL OF HEMORRHAGE, GI TRACT, ENDOSCOPIC, BY CLIPPING OR OVERSEWING;  Surgeon: Felecia Hopper, MD;  Location: [facility] ENDOSCOPY;  Service: Gastroenterology;;   IR ANGIOGRAM FOLLOW UP STUDY  12/19/2020   IR ANGIOGRAM SELECTIVE EACH ADDITIONAL VESSEL  12/19/2020   IR ANGIOGRAM SELECTIVE EACH ADDITIONAL VESSEL  12/19/2020   IR ANGIOGRAM VISCERAL SELECTIVE  12/19/2020   IR EMBO ART  VEN HEMORR LYMPH EXTRAV  INC GUIDE ROADMAPPING  12/19/2020   IR US  GUIDE VASC ACCESS RIGHT  12/19/2020    JOINT REPLACEMENT     POLYPECTOMY  05/04/2022   Procedure: POLYPECTOMY;  Surgeon: Genell Ken, MD;  Location: [facility] ENDOSCOPY;  Service: Gastroenterology;;   POLYPECTOMY  10/26/2023   Procedure: POLYPECTOMY, INTESTINE;  Surgeon: Felecia Hopper, MD;  Location: [facility] ENDOSCOPY;  Service: Gastroenterology;;   STAPEDES SURGERY Left    "scraped stapedes because it was sticking when it wasn't suppose to"   TONSILLECTOMY AND ADENOIDECTOMY  1946   TOTAL KNEE ARTHROPLASTY Left ~ 2008   TOTAL KNEE ARTHROPLASTY  12/20/2011   Procedure: TOTAL KNEE ARTHROPLASTY;  Surgeon: Lorriane Rote, MD;  Location: [facility] OR;  Service: Orthopedics;  Laterality: Right;  Right Total Knee Arthroplasty   TUBAL LIGATION  1980's   Social History:  reports that she quit smoking about 31 years ago. Her smoking use included cigarettes. She started smoking about 68 years ago. She has a 27.8 pack-year smoking history. She has never been exposed to tobacco smoke. She has never used smokeless tobacco. She reports current alcohol  use of about 7.0 standard drinks of alcohol  per week. She reports that she does not use drugs.  Allergies  Allergen Reactions   Morphine And Codeine Itching   Aspirin Other (See Comments)    Unknown reaction   Duloxetine  Hcl Other (See Comments)    Unknown reaction   Tymlos [Abaloparatide] Other (See Comments)    Unknown reaction   Ventolin  [Albuterol ] Other (See Comments)  rapid heart beat   Cefadroxil Hives    Patient can take amoxicillin  and cipro     Family History  Problem Relation Age of Onset   Heart attack Mother    Heart disease Father    Heart failure Father    Bladder Cancer Father    Supraventricular tachycardia Sister    Hypertension Sister     Prior to Admission medications   Medication Sig Start Date End Date Taking? Authorizing Provider  acetaminophen  (TYLENOL ) 500 MG tablet Take 500 mg by mouth daily as needed for moderate pain (pain score 4-6).   Yes [provider]   acetaminophen  (TYLENOL ) 650 MG CR tablet Take 1,300 mg by mouth in the morning and at bedtime.   Yes [provider]  albuterol  (VENTOLIN  HFA) 108 (90 Base) MCG/ACT inhaler Inhale 2 puffs into the lungs every 4 (four) hours as needed for wheezing or shortness of breath.   Yes [provider]  alendronate (FOSAMAX) 70 MG tablet Take 70 mg by mouth every Sunday. Take with a full glass of water on an empty stomach.   Yes [provider]  Ascorbic Acid  (VITAMIN C ) 1000 MG tablet Take 1,000 mg by mouth every morning.   Yes [provider]  cetirizine  (ZYRTEC ) 5 MG chewable tablet Chew 5 mg by mouth daily as needed for allergies.   Yes [provider]  Cholecalciferol  (VITAMIN D3) 50 MCG (2000 UT) TABS Take 2,000 Units by mouth every morning.   Yes [provider]  cyanocobalamin  (VITAMIN B12) 1000 MCG tablet Take 1,000 mcg by mouth daily.   Yes [provider]  docusate sodium  (COLACE) 100 MG capsule Take 200 mg by mouth at bedtime.   Yes [provider]  Dorzolamide  HCl-Timolol  Mal PF 2-0.5 % SOLN Place 1 drop into both eyes in the morning and at bedtime.   Yes [provider]  ferrous gluconate  (FERGON) 240 (27 FE) MG tablet Take 240 mg by mouth every morning. Take without food   Yes [provider]  fluticasone  (FLONASE ) 50 MCG/ACT nasal spray Place 1 spray into both nostrils daily as needed for allergies or rhinitis.   Yes [provider]  guaiFENesin  (ROBITUSSIN) 100 MG/5ML liquid Take 10 mLs by mouth every 4 (four) hours as needed for cough or to loosen phlegm. 04/28/23  Yes Rai, Ripudeep K, MD  latanoprost  (XALATAN ) 0.005 % ophthalmic solution Place 1 drop into both eyes at bedtime.   Yes [provider]  Multiple Vitamins-Minerals (PRESERVISION AREDS 2) CAPS Take 1 capsule by mouth 2 (two) times daily.   Yes [provider]  Netarsudil  Dimesylate (RHOPRESSA ) 0.02 % SOLN Place 1 drop  into both eyes at bedtime.   Yes [provider]  oxybutynin  (DITROPAN -XL) 10 MG 24 hr tablet Take 10 mg by mouth at bedtime.   Yes [provider]  pantoprazole  (PROTONIX ) 40 MG tablet Take 40 mg by mouth 2 (two) times daily.   Yes [provider]  polyethylene glycol (MIRALAX  / GLYCOLAX ) 17 g packet Take 8.5 g by mouth daily as needed (constipation).   Yes [provider]  potassium chloride  SA (KLOR-CON  M) 20 MEQ tablet Take 20 mEq by mouth once.   Yes [provider]  Probiotic Product (ALIGN) 4 MG CAPS Take 4 mg by mouth at bedtime.   Yes [provider]  rOPINIRole  (REQUIP ) 0.5 MG tablet Take 1 tablet (0.5 mg total) by mouth daily as needed. 04/30/23  Yes [name], Amy  E, NP  sertraline  (ZOLOFT ) 50 MG tablet Take 1.5 tablets (75 mg total) by mouth daily. 01/07/23  Yes Samtani, Jai-Gurmukh, MD  simvastatin  (ZOCOR ) 20 MG tablet Take 20 mg by mouth every evening.   Yes [provider]  Sodium Fluoride  (PREVIDENT 5000 BOOSTER PLUS) 1.1 % PSTE Place 1 Application onto teeth See admin instructions. Brush on teeth with a toothbrush after evening mouth care. Spit out excess and do not rinse.   Yes [provider]  torsemide  (DEMADEX ) 20 MG tablet Take 20 mg by mouth daily.   Yes [provider]  umeclidinium-vilanterol (ANORO ELLIPTA ) 62.5-25 MCG/ACT AEPB Inhale 1 puff into the lungs every morning.   Yes [provider]  zinc oxide 20 % ointment Apply 1 Application topically See admin instructions. Apply topically to buttocks after every incontinent episode and as needed for redness   Yes [provider]  amoxicillin -clavulanate (AUGMENTIN ) 875-125 MG tablet Take 1 tablet by mouth every 12 (twelve) hours. Patient not taking: Reported on 11/18/2023 10/27/23   Lonita Roach, MD    Physical Exam: Vitals:   11/18/23 0153 11/18/23 0154 11/18/23 0515 11/18/23 0538  BP:   (!) 118/57 (!) 114/53  Pulse:   (!)  106 (!) 102  Resp:   (!) 22 (!) 23  Temp:   98.2 F (36.8 C) 98 F (36.7 C)  TempSrc:   Oral Oral  SpO2: 96%  98% (!) 69%  Weight:  77.2 kg    Height:  5\' 1"  (1.549 m)     Physical Exam Vitals and nursing note reviewed.  Constitutional:      General: She is awake. She is not in acute distress.    Appearance: Normal appearance. She is ill-appearing.  HENT:     Head: Normocephalic.     Nose: No rhinorrhea.     Mouth/Throat:     Mouth: Mucous membranes are dry.  Eyes:     General: No scleral icterus.    Pupils: Pupils are equal, round, and reactive to light.  Neck:     Vascular: No JVD.  Cardiovascular:     Rate and Rhythm: Regular rhythm. Tachycardia present.     Heart sounds: S1 normal and S2 normal.  Pulmonary:     Breath sounds: No wheezing, rhonchi or rales.  Abdominal:     General: There is no distension.     Palpations: Abdomen is soft.     Tenderness: There is abdominal tenderness. There is no right CVA tenderness, left CVA tenderness, guarding or rebound.  Musculoskeletal:     Cervical back: Neck supple.     Right lower leg: No edema.     Left lower leg: No edema.  Neurological:     General: No focal deficit present.     Mental Status: She is alert and oriented to person, place, and time.  Psychiatric:        Mood and Affect: Mood normal.        Behavior: Behavior normal. Behavior is cooperative.     Data Reviewed:  Results are pending, will review when available. Echo 05/05/2021 IMPRESSIONS:   1. Left ventricular ejection fraction, by estimation, is 60 to 65%. The  left ventricle has normal function. The left ventricle has no regional  wall motion abnormalities. Left ventricular diastolic parameters are  indeterminate.   2. Right ventricular systolic function is normal. The right ventricular  size is normal. There is moderately elevated pulmonary artery systolic  pressure.   3.  Left atrial size was mildly dilated.   4. Right atrial size was mildly  dilated.   5. The mitral valve is abnormal. Mild mitral valve regurgitation. No  evidence of mitral stenosis. Moderate mitral annular calcification.   6. The aortic valve is tricuspid. There is moderate calcification of the  aortic valve. Aortic valve regurgitation is mild. Mild to moderate aortic  valve sclerosis/calcification is present, without any evidence of aortic  stenosis.   7. The inferior vena cava is dilated in size with >50% respiratory  variability, suggesting right atrial pressure of 8 mmHg.   Assessment and Plan:  GI bleed With acute on chronic: Iron  deficiency anemia History of diverticulosis of intestine with bleeding Admit to stepdown/inpatient. Keep n.p.o. for now. Continue pantoprazole  40 mg IVP twice daily Monitor H&H. Transfuse further as needed. Keep 2 units of PRBC ahead. GI consult appreciated.   Active Problems:   Depression with anxiety Continue sertraline  25 mg p.o. at bedtime.     Hyperlipidemia Continue simvastatin  20 mg p.o. daily.     Glaucoma Continue Cosopt , Rhopressa  and Xalatan  drops. Follow-up with ophthalmology as an outpatient.     COPD (chronic obstructive pulmonary disease) (HCC) Asymptomatic at this time. Bronchodilators as needed.     Hypertension Hold metoprolol  tonight.     GERD (gastroesophageal reflux disease) On PPI as above.     Class 1 obesity  Current BMI 32.24 kg/m. Lifestyle modifications. Follow-up with closely PCP.     Thoracic aorta atherosclerosis (HCC) On simvastatin .    Diastolic heart failure (HCC) No signs of volume overload.     Pulmonary nodule 1 cm or greater in diameter Follow-up with PCP of pulmonary clinic as an outpatient.    Advance Care Planning:   Code Status: Do not attempt resuscitation (DNR) PRE-ARREST INTERVENTIONS DESIRED   Consults: [facility].  Family Communication:   Severity of Illness: The appropriate patient status for this patient is INPATIENT. Inpatient status is judged  to be reasonable and necessary in order to provide the required intensity of service to ensure the patient's safety. The patient's presenting symptoms, physical exam findings, and initial radiographic and laboratory data in the context of their chronic comorbidities is felt to place them at high risk for further clinical deterioration. Furthermore, it is not anticipated that the patient will be medically stable for discharge from the hospital within 2 midnights of admission.   * I certify that at the point of admission it is my clinical judgment that the patient will require inpatient hospital care spanning beyond 2 midnights from the point of admission due to high intensity of service, high risk for further deterioration and high frequency of surveillance required.*  Author: Danice Dural, MD 11/18/2023 8:00 AM  For on call review [email or web address].   This document was prepared using Dragon voice recognition software and may contain some unintended transcription errors.

## 2023-11-19 ENCOUNTER — Encounter (HOSPITAL_COMMUNITY): Payer: Self-pay | Admitting: Gastroenterology

## 2023-11-19 DIAGNOSIS — K922 Gastrointestinal hemorrhage, unspecified: Secondary | ICD-10-CM | POA: Diagnosis not present

## 2023-11-19 LAB — PHOSPHORUS: Phosphorus: 2.2 mg/dL — ABNORMAL LOW (ref 2.5–4.6)

## 2023-11-19 LAB — GLUCOSE, CAPILLARY
Glucose-Capillary: 141 mg/dL — ABNORMAL HIGH (ref 70–99)
Glucose-Capillary: 154 mg/dL — ABNORMAL HIGH (ref 70–99)

## 2023-11-19 LAB — CBC
HCT: 22 % — ABNORMAL LOW (ref 36.0–46.0)
Hemoglobin: 7.3 g/dL — ABNORMAL LOW (ref 12.0–15.0)
MCH: 32.6 pg (ref 26.0–34.0)
MCHC: 33.2 g/dL (ref 30.0–36.0)
MCV: 98.2 fL (ref 80.0–100.0)
Platelets: 132 10*3/uL — ABNORMAL LOW (ref 150–400)
RBC: 2.24 MIL/uL — ABNORMAL LOW (ref 3.87–5.11)
RDW: 17.2 % — ABNORMAL HIGH (ref 11.5–15.5)
WBC: 8.3 10*3/uL (ref 4.0–10.5)
nRBC: 0.7 % — ABNORMAL HIGH (ref 0.0–0.2)

## 2023-11-19 LAB — COMPREHENSIVE METABOLIC PANEL WITH GFR
ALT: 8 U/L (ref 0–44)
AST: 14 U/L — ABNORMAL LOW (ref 15–41)
Albumin: 2.6 g/dL — ABNORMAL LOW (ref 3.5–5.0)
Alkaline Phosphatase: 29 U/L — ABNORMAL LOW (ref 38–126)
Anion gap: 5 (ref 5–15)
BUN: 22 mg/dL (ref 8–23)
CO2: 25 mmol/L (ref 22–32)
Calcium: 7.5 mg/dL — ABNORMAL LOW (ref 8.9–10.3)
Chloride: 107 mmol/L (ref 98–111)
Creatinine, Ser: 0.38 mg/dL — ABNORMAL LOW (ref 0.44–1.00)
GFR, Estimated: >60 mL/min (ref >60)
Glucose, Bld: 123 mg/dL — ABNORMAL HIGH (ref 70–99)
Potassium: 2.9 mmol/L — ABNORMAL LOW (ref 3.5–5.1)
Sodium: 137 mmol/L (ref 135–145)
Total Bilirubin: 0.8 mg/dL (ref 0.0–1.2)
Total Protein: 4.5 g/dL — ABNORMAL LOW (ref 6.5–8.1)

## 2023-11-19 MED ORDER — POTASSIUM CHLORIDE CRYS ER 20 MEQ PO TBCR
40.0000 meq | EXTENDED_RELEASE_TABLET | Freq: Once | ORAL | Status: AC
  Administered 2023-11-19: 40 meq via ORAL
  Filled 2023-11-19: qty 2

## 2023-11-19 MED ORDER — GLUCAGON HCL RDNA (DIAGNOSTIC) 1 MG IJ SOLR: 1.0000 mg | INTRAMUSCULAR | Status: DC | PRN

## 2023-11-19 MED ORDER — SERTRALINE HCL 50 MG PO TABS
75.0000 mg | ORAL_TABLET | Freq: Every day | ORAL | Status: DC
  Administered 2023-11-19 – 2023-11-26 (×8): 75 mg via ORAL
  Filled 2023-11-19: qty 1
  Filled 2023-11-19 (×2): qty 2
  Filled 2023-11-19 (×3): qty 1
  Filled 2023-11-19 (×2): qty 2

## 2023-11-19 MED ORDER — VITAMIN B-12 1000 MCG PO TABS
1000.0000 ug | ORAL_TABLET | Freq: Every day | ORAL | Status: DC
  Administered 2023-11-19 – 2023-11-26 (×8): 1000 ug via ORAL
  Filled 2023-11-19 (×8): qty 1

## 2023-11-19 MED ORDER — SODIUM CHLORIDE 0.9 % IV SOLN: INTRAVENOUS | Status: DC

## 2023-11-19 MED ORDER — TRAZODONE HCL 50 MG PO TABS
50.0000 mg | ORAL_TABLET | Freq: Every evening | ORAL | Status: DC | PRN
  Administered 2023-11-19: 50 mg via ORAL
  Filled 2023-11-19 (×3): qty 1

## 2023-11-19 MED ORDER — DEXTROSE-SODIUM CHLORIDE 5-0.45 % IV SOLN: INTRAVENOUS | Status: AC

## 2023-11-19 MED ORDER — SENNOSIDES-DOCUSATE SODIUM 8.6-50 MG PO TABS: 1.0000 | ORAL_TABLET | Freq: Every evening | ORAL | Status: DC | PRN

## 2023-11-19 MED ORDER — HYDRALAZINE HCL 20 MG/ML IJ SOLN: 10.0000 mg | INTRAMUSCULAR | Status: DC | PRN

## 2023-11-19 MED ORDER — SIMVASTATIN 20 MG PO TABS
20.0000 mg | ORAL_TABLET | Freq: Every evening | ORAL | Status: DC
  Administered 2023-11-19 – 2023-11-26 (×8): 20 mg via ORAL
  Filled 2023-11-19 (×8): qty 1

## 2023-11-19 MED ORDER — METOPROLOL TARTRATE 5 MG/5ML IV SOLN
5.0000 mg | INTRAVENOUS | Status: DC | PRN
  Administered 2023-11-21 – 2023-11-22 (×2): 5 mg via INTRAVENOUS
  Filled 2023-11-19 (×3): qty 5

## 2023-11-19 MED ORDER — POTASSIUM CHLORIDE 10 MEQ/100ML IV SOLN
10.0000 meq | INTRAVENOUS | Status: DC
  Administered 2023-11-19 (×3): 10 meq via INTRAVENOUS
  Filled 2023-11-19 (×5): qty 100

## 2023-11-19 MED ORDER — IPRATROPIUM-ALBUTEROL 0.5-2.5 (3) MG/3ML IN SOLN: 3.0000 mL | RESPIRATORY_TRACT | Status: DC | PRN

## 2023-11-19 MED ORDER — POTASSIUM CHLORIDE 10 MEQ/100ML IV SOLN
10.0000 meq | INTRAVENOUS | Status: AC
  Administered 2023-11-19 (×3): 10 meq via INTRAVENOUS

## 2023-11-19 NOTE — Plan of Care (Signed)

## 2023-11-19 NOTE — Plan of Care (Signed)

## 2023-11-19 NOTE — Progress Notes (Signed)
 PROGRESS NOTE    [name]  ZOX:[id number] DOB: 1940/10/19 DOA: 11/18/2023 PCP: Arnetha Bhat, NP    Brief Narrative:  83 year old with history of anxiety, depression, chronic diastolic CHF, OA, glaucoma, HLD, HTN, pulmonary nodule, RLS, osteoarthritis, COPD, GERD, diverticular bleed with history of embolization, upper GI bleed, chronic anemia, gastritis comes to the hospital for multiple episodes of hematochezia.  Upon admission hemoglobin noted to be 5.3   Assessment & Plan:  Principal Problem:   Acute GI bleeding Active Problems:   Depression with anxiety   Hyperlipidemia   Glaucoma   Diverticulosis of intestine with bleeding   COPD (chronic obstructive pulmonary disease) (HCC)   Iron  deficiency anemia   Hypertension   GERD (gastroesophageal reflux disease)   Diastolic heart failure (HCC)   Class 1 obesity   Thoracic aorta atherosclerosis (HCC)   Hematochezia Lower GI bleed History of multiple GI bleed Acute on chronic anemia -Baseline hemoglobin 9.5, admission hemoglobin 5.3 requiring 3 unit PRBC transfusion.  Continue transfusion as necessary. - PPI IV twice daily - [facility] following, video capsule endoscopy results pending  Colonoscopy 09/2023-hemorrhoids, ischemic colitis, multiple polyps, diverticulosis Endoscopy 09/2023-gastritis, gastric polyps  Hypokalemia -As needed repletion.  Check mag Phos and ionized calcium   Depression - Statin  Glaucoma - Eyedrops  COPD - Not an active exacerbation.  As needed bronchodilators  Essential hypertension - On metoprolol .  IV as needed  GERD - PPI  Pulmonary nodule - Follow-up outpatient clinic  Congestive heart failure with preserved ejection fraction - No signs of volume overload  Osteoarthritis - Pain control  Restless leg syndrome - Requip   Depression - Zoloft   Currently multiple home medications are on hold.  Will resume when appropriate  DVT prophylaxis: SCDs Start: 11/18/23 8119      Code  Status: Do not attempt resuscitation (DNR) PRE-ARREST INTERVENTIONS DESIRED Family Communication: Husband at bedside Status is: Inpatient Ongoing GI evaluation    Subjective: Had dark this morning otherwise no complaints   Examination:  General exam: Appears calm and comfortable, hard of hearing Respiratory system: Clear to auscultation. Respiratory effort normal. Cardiovascular system: S1 & S2 heard, RRR. No JVD, murmurs, rubs, gallops or clicks. No pedal edema. Gastrointestinal system: Abdomen is nondistended, soft and nontender. No organomegaly or masses felt. Normal bowel sounds heard. Central nervous system: Alert and oriented. No focal neurological deficits. Extremities: Symmetric 5 x 5 power. Skin: No rashes, lesions or ulcers Psychiatry: Judgement and insight appear normal. Mood & affect appropriate.                Diet Orders (From admission, onward)     Start     Ordered   11/18/23 0926  Diet NPO time specified  Diet effective now        11/18/23 0925            Objective: Vitals:   11/19/23 0700 11/19/23 0800 11/19/23 0900 11/19/23 1000  BP: (!) 109/32 (!) 134/51 (!) 113/42 (!) 125/33  Pulse: 81 (!) 105 93 76  Resp: 20 (!) 26 (!) 24 18  Temp:  98.3 F (36.8 C)    TempSrc:  Oral    SpO2: 94% 91% 96% 97%  Weight:      Height:        Intake/Output Summary (Last 24 hours) at 11/19/2023 1150 Last data filed at 11/19/2023 1100 Gross per 24 hour  Intake 1338.07 ml  Output 360 ml  Net 978.07 ml   [employer]  11/18/23 0154 11/18/23 0947 11/18/23 1100  Weight: 77.2 kg 77.2 kg 77.4 kg    Scheduled Meds:  Chlorhexidine  Gluconate Cloth  6 each Topical Daily   pantoprazole  (PROTONIX ) IV  40 mg Intravenous Q12H   potassium chloride   40 mEq Oral Once   Continuous Infusions:  dextrose  5 % and 0.45 % NaCl 50 mL/hr at 11/19/23 1100   potassium chloride  10 mEq (11/19/23 1134)    Nutritional status     Body mass index is 32.24  kg/m.  Data Reviewed:   CBC: Recent Labs  Lab 11/18/23 0210 11/18/23 1025 11/18/23 1749 11/18/23 2035 11/19/23 0302  WBC 10.3  --   --   --  8.3  NEUTROABS 6.9  --   --   --   --   HGB 5.3* 5.4* 7.9* 7.7* 7.3*  HCT 17.0* 16.7* 23.2* 23.8* 22.0*  MCV 103.7*  --   --   --  98.2  PLT 179  --   --   --  132*   Basic Metabolic Panel: Recent Labs  Lab 11/18/23 0210 11/18/23 1025 11/19/23 0302 11/19/23 0904  NA 135 138 137  --   K 3.4* 3.0* 2.9*  --   CL 101 105 107  --   CO2 24 25 25   --   GLUCOSE 153* 148* 123*  --   BUN 28* 25* 22  --   CREATININE 0.60 0.57 0.38*  --   CALCIUM  8.0* 7.4* 7.5*  --   MG  --  1.9  --   --   PHOS  --  2.6  --  2.2*   GFR: Estimated Creatinine Clearance: 48.4 mL/min (A) (by C-G formula based on SCr of 0.38 mg/dL (L)). Liver Function Tests: Recent Labs  Lab 11/18/23 0210 11/19/23 0302  AST 15 14*  ALT 10 8  ALKPHOS 40 29*  BILITOT 0.7 0.8  PROT 5.5* 4.5*  ALBUMIN 3.3* 2.6*   No results for input(s): "LIPASE", "AMYLASE" in the last 168 hours. No results for input(s): "AMMONIA" in the last 168 hours. Coagulation Profile: No results for input(s): "INR", "PROTIME" in the last 168 hours. Cardiac Enzymes: No results for input(s): "CKTOTAL", "CKMB", "CKMBINDEX", "TROPONINI" in the last 168 hours. BNP (last 3 results) No results for input(s): "PROBNP" in the last 8760 hours. HbA1C: No results for input(s): "HGBA1C" in the last 72 hours. CBG: No results for input(s): "GLUCAP" in the last 168 hours. Lipid Profile: No results for input(s): "CHOL", "HDL", "LDLCALC", "TRIG", "CHOLHDL", "LDLDIRECT" in the last 72 hours. Thyroid  Function Tests: No results for input(s): "TSH", "T4TOTAL", "FREET4", "T3FREE", "THYROIDAB" in the last 72 hours. Anemia Panel: No results for input(s): "VITAMINB12", "FOLATE", "FERRITIN", "TIBC", "IRON ", "RETICCTPCT" in the last 72 hours. Sepsis Labs: No results for input(s): "PROCALCITON", "LATICACIDVEN" in the  last 168 hours.  Recent Results (from the past 240 hours)  MRSA Next Gen by PCR, Nasal     Status: None   Collection Time: 11/18/23 11:30 AM   Specimen: Nasal Mucosa; Nasal Swab  Result Value Ref Range Status   MRSA by PCR Next Gen NOT DETECTED NOT DETECTED Final    Comment: (NOTE) The GeneXpert MRSA Assay (FDA approved for NASAL specimens only), is one component of a comprehensive MRSA colonization surveillance program. It is not intended to diagnose MRSA infection nor to guide or monitor treatment for MRSA infections. Test performance is not FDA approved in patients less than 42 years old. Performed at [facility], [address] [place], [place] [address]          Radiology Studies: No results found.         LOS: 1 day   Time spent= 35 mins    Maggie Schooner, MD [facility]  If 7PM-7AM, please contact night-coverage  11/19/2023, 11:50 AM

## 2023-11-19 NOTE — Progress Notes (Signed)
 [facility] Progress Note  CYANA SHOOK 83 y.o. 1941-06-30   Subjective: Denies abdominal pain. Resting in bed. No family present. Unable to reach husband by phone.  Objective: Vital signs: Vitals:   11/19/23 1300 11/19/23 1400  BP: (!) 108/30 (!) 110/47  Pulse: 73 77  Resp: (!) 21 (!) 26  Temp:    SpO2: 96% 94%    Physical Exam: Gen: elderly, lethargic, well-nourished, no acute distress  HEENT: anicteric sclera CV: RRR Chest: CTA B Abd: diffuse tenderness with guarding, soft, nondistended, +BS Ext: no edema  Lab Results: Recent Labs    11/18/23 1025 11/19/23 0302 11/19/23 0904  NA 138 137  --   K 3.0* 2.9*  --   CL 105 107  --   CO2 25 25  --   GLUCOSE 148* 123*  --   BUN 25* 22  --   CREATININE 0.57 0.38*  --   CALCIUM  7.4* 7.5*  --   MG 1.9  --   --   PHOS 2.6  --  2.2*   Recent Labs    11/18/23 0210 11/19/23 0302  AST 15 14*  ALT 10 8  ALKPHOS 40 29*  BILITOT 0.7 0.8  PROT 5.5* 4.5*  ALBUMIN 3.3* 2.6*   Recent Labs    11/18/23 0210 11/18/23 1025 11/18/23 2035 11/19/23 0302  WBC 10.3  --   --  8.3  NEUTROABS 6.9  --   --   --   HGB 5.3*   < > 7.7* 7.3*  HCT 17.0*   < > 23.8* 22.0*  MCV 103.7*  --   --  98.2  PLT 179  --   --  132*   < > = values in this interval not displayed.    Capsule - minimal gastritis. Nodular area in duodenal bulb. No active bleeding seen. See capsule endo report for complete details.  Assessment/Plan: Nodular mucosal area in duodenal bulb on capsule endoscopy without any active bleeding. EGD with possible enteroscopy tomorrow. Clear liquid diet today and NPO p MN. Supportive care.    [name] 11/19/2023, 4:56 PM  Questions please call [phone] ID: Patrica Bookman, female   DOB: April 14, 1941, 83 y.o.   MRN: [id number]

## 2023-11-19 NOTE — H&P (View-Only) (Signed)
 [facility] Progress Note  [name] 83 y.o. 1941-06-30   Subjective: Denies abdominal pain. Resting in bed. No family present. Unable to reach husband by phone.  Objective: Vital signs: Vitals:   11/19/23 1300 11/19/23 1400  BP: (!) 108/30 (!) 110/47  Pulse: 73 77  Resp: (!) 21 (!) 26  Temp:    SpO2: 96% 94%    Physical Exam: Gen: elderly, lethargic, well-nourished, no acute distress  HEENT: anicteric sclera CV: RRR Chest: CTA B Abd: diffuse tenderness with guarding, soft, nondistended, +BS Ext: no edema  Lab Results: Recent Labs    11/18/23 1025 11/19/23 0302 11/19/23 0904  NA 138 137  --   K 3.0* 2.9*  --   CL 105 107  --   CO2 25 25  --   GLUCOSE 148* 123*  --   BUN 25* 22  --   CREATININE 0.57 0.38*  --   CALCIUM  7.4* 7.5*  --   MG 1.9  --   --   PHOS 2.6  --  2.2*   Recent Labs    11/18/23 0210 11/19/23 0302  AST 15 14*  ALT 10 8  ALKPHOS 40 29*  BILITOT 0.7 0.8  PROT 5.5* 4.5*  ALBUMIN 3.3* 2.6*   Recent Labs    11/18/23 0210 11/18/23 1025 11/18/23 2035 11/19/23 0302  WBC 10.3  --   --  8.3  NEUTROABS 6.9  --   --   --   HGB 5.3*   < > 7.7* 7.3*  HCT 17.0*   < > 23.8* 22.0*  MCV 103.7*  --   --  98.2  PLT 179  --   --  132*   < > = values in this interval not displayed.    Capsule - minimal gastritis. Nodular area in duodenal bulb. No active bleeding seen. See capsule endo report for complete details.  Assessment/Plan: Nodular mucosal area in duodenal bulb on capsule endoscopy without any active bleeding. EGD with possible enteroscopy tomorrow. Clear liquid diet today and NPO p MN. Supportive care.    [name] 11/19/2023, 4:56 PM  Questions please call [phone] ID: [name], female   DOB: April 14, 1941, 83 y.o.   MRN: [id number]

## 2023-11-19 NOTE — Hospital Course (Addendum)
 Brief Narrative:  83 year old with history of anxiety, depression, chronic diastolic CHF, OA, glaucoma, HLD, HTN, pulmonary nodule, RLS, osteoarthritis, COPD, GERD, diverticular bleed with history of embolization, upper GI bleed, chronic anemia, gastritis comes to the hospital for multiple episodes of hematochezia.  Upon admission hemoglobin noted to be 5.3.  Video capsule shows mild gastritis, nodular area in duodenal bulb without any active bleeding.  EGD performed on 5/22 showed normal esophagus, gastritis, multiple gastric polyps.  No biopsies were taken.  Small bowel enteroscopy showed normal duodenal bulb.  At this time it may be beneficial for patient to get double balloon enteroscopy in the future if her anemia persists. Currently awaiting SNF placement.  Assessment & Plan:  Principal Problem:   Acute GI bleeding Active Problems:   Depression with anxiety   Hyperlipidemia   Glaucoma   Diverticulosis of intestine with bleeding   COPD (chronic obstructive pulmonary disease) (HCC)   Iron  deficiency anemia   Hypertension   GERD (gastroesophageal reflux disease)   Diastolic heart failure (HCC)   Class 1 obesity   Thoracic aorta atherosclerosis (HCC)   Hematochezia Lower GI bleed History of multiple GI bleed Acute on chronic anemia -Baseline hemoglobin 9.5, admission hemoglobin 5.3 requiring 3 unit PRBC transfusion.  Continue transfusion as necessary.  Hemoglobin 8.3. -Video capsule shows mild gastritis, nodular area in duodenal bulb without any active bleeding.  EGD performed on 5/22 showed normal esophagus, gastritis, multiple gastric polyps.  No biopsies were taken.  Small bowel enteroscopy showed normal duodenal bulb.  Recommendations are if the anemia persist, may pursue double-balloon enteroscopy at a tertiary care level - PPI twice daily - [facility] has signed off   Colonoscopy 09/2023-hemorrhoids, ischemic colitis, multiple polyps, diverticulosis Endoscopy 09/2023-gastritis,  gastric polyps  Hypokalemia -As needed repletion.   Depression - Statin  Glaucoma - Eyedrops  COPD - Not an active exacerbation.  As needed bronchodilators  Essential hypertension - On metoprolol .  IV as needed  GERD - PPI  Pulmonary nodule - Follow-up outpatient clinic  Congestive heart failure with preserved ejection fraction - No signs of volume overload  Osteoarthritis - Pain control  Restless leg syndrome - Requip   Depression - Zoloft   PT/OT-SNF, TOC consulted Labs every 3 days  DVT prophylaxis: SCDs Start: 11/18/23 4782 DNR/DNI Family Communication: Son updated periodically Status is: Inpatient PT recommending SNF    Subjective: Sitting up in the bed, no complaints this morning.  She is ready to go to rehab   Examination:  General exam: Appears calm and comfortable, hard of hearing; elderly frail Respiratory system: Clear to auscultation. Respiratory effort normal. Cardiovascular system: Sinus tachycardia.  Gastrointestinal system: Abdomen is nondistended, soft and nontender. No organomegaly or masses felt. Normal bowel sounds heard. Central nervous system: Alert and oriented. No focal neurological deficits. Extremities: Symmetric 5 x 5 power. Skin: No rashes, lesions or ulcers Psychiatry: Judgement and insight appear normal. Mood & affect appropriate.

## 2023-11-19 NOTE — TOC Initial Note (Signed)
 Transition of Care [facility]) - Initial/Assessment Note    Patient Details  Name: [name] MRN: [id number] Date of Birth: [birth year]  [facility] CM/SW Contact:    Amaryllis Junior, LCSW Phone Number: 11/19/2023, 11:33 AM  Clinical Narrative:                 Pt from [facility]. CSW spoke with Ennis Hart [phone] from [facility] to confirm that pt has bed available if SNF is needed. Will need auth if pt requires SNF. Pt continues medical workup. TOC following for dc needs.     Barriers to Discharge: Continued Medical Work up   Patient Goals and CMS Choice Patient states their goals for this hospitalization and ongoing recovery are:: return to ALF CMS Medicare.gov Compare Post Acute Care list provided to::  (NA) Choice offered to / list presented to : NA [place] ownership interest in [facility].provided to::  (NA)    Expected Discharge Plan and Services In-house Referral: NA Discharge Planning Services: NA   Living arrangements for the past 2 months: Assisted Living Facility                 DME Arranged: N/A DME Agency: NA       HH Arranged: NA HH Agency: NA        Prior Living Arrangements/Services Living arrangements for the past 2 months: Assisted Living Facility Lives with:: Facility Resident Patient language and need for interpreter reviewed:: Yes Do you feel safe going back to the place where you live?: Yes      Need for Family Participation in Patient Care: Yes (Comment) Care giver support system in place?: Yes (comment)   Criminal Activity/Legal Involvement Pertinent to Current Situation/Hospitalization: No - Comment as needed  Activities of Daily Living   ADL Screening (condition at time of admission) Independently performs ADLs?: No Does the patient have a NEW difficulty with bathing/dressing/toileting/self-feeding that is expected to last >3 days?: No Does the patient have a NEW difficulty with getting in/out of  bed, walking, or climbing stairs that is expected to last >3 days?: No Does the patient have a NEW difficulty with communication that is expected to last >3 days?: No Is the patient deaf or have difficulty hearing?: No Does the patient have difficulty seeing, even when wearing glasses/contacts?: No Does the patient have difficulty concentrating, remembering, or making decisions?: No  Permission Sought/Granted                  Emotional Assessment Appearance:: Appears stated age Attitude/Demeanor/Rapport: Engaged Affect (typically observed): Accepting Orientation: : Oriented to Self, Oriented to Place, Oriented to  Time, Oriented to Situation Alcohol  / Substance Use: Not Applicable Psych Involvement: No (comment)  Admission diagnosis:  Melena [K92.1] Acute GI bleeding [K92.2] Symptomatic anemia [D64.9] Patient Active Problem List   Diagnosis Date Noted   Acute GI bleeding 11/18/2023   Ischemic colitis (HCC) 10/27/2023   Acute upper GI bleed 10/25/2023   Edema of right lower extremity 10/25/2023   Chronic heart failure with preserved ejection fraction (HFpEF) (HCC) 10/25/2023   UGI bleed 04/19/2023   Recurrent depression (HCC) 04/13/2023   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   History of GI diverticular bleed 03/28/2023   GAD (generalized anxiety disorder) 03/28/2023   Diastolic heart failure (HCC) 03/19/2023   Class 1 obesity 03/19/2023   Thoracic aorta atherosclerosis (HCC) 03/19/2023   Melena 01/05/2023   Hypotension 05/02/2022   UTI (urinary tract  infection) 02/05/2022   Restless leg syndrome 02/05/2022   Senile osteoporosis 02/05/2022   Hypertension 02/05/2022   Pulmonary nodule 1 cm or greater in diameter 02/05/2022   GERD (gastroesophageal reflux disease) 02/05/2022   Upper GI bleed 01/07/2022   Iron  deficiency anemia 05/02/2021   Chronic anemia    DOE (dyspnea on exertion) 11/18/2017   COPD (chronic obstructive pulmonary disease) (HCC)  11/18/2017   Diverticulosis of intestine with bleeding 09/05/2017   Acute respiratory failure with hypoxia (HCC) 08/07/2017   Depression with anxiety 08/07/2017   Hyperlipidemia 08/07/2017   Macular degeneration 08/07/2017   Glaucoma 08/07/2017   Weakness 08/07/2017   Dehydration 08/07/2017   Lower GI bleeding 10/14/2016   Diverticulosis of colon 10/14/2016   Anxiety 10/14/2016   Lower GI bleed 10/14/2016   GI bleed    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 [place] [address] Phone: [phone] Fax: [phone]     Social Drivers of Health (SDOH) Social History: SDOH Screenings   Food Insecurity: No Food Insecurity (11/18/2023)  Housing: Low Risk  (11/18/2023)  Transportation Needs: No Transportation Needs (11/18/2023)  Utilities: Not At Risk (11/18/2023)  Alcohol  Screen: Low Risk  (04/04/2023)  Depression (PHQ2-9): Low Risk  (10/24/2023)  Financial Resource Strain: Low Risk  (04/04/2023)  Physical Activity: Insufficiently Active (04/04/2023)  Social Connections: Socially Integrated (11/18/2023)  Stress: No Stress Concern Present (04/04/2023)  Tobacco Use: Medium Risk (11/18/2023)  Health Literacy: Adequate Health Literacy (04/04/2023)   SDOH Interventions:     Readmission Risk Interventions    11/19/2023   11:31 AM 04/29/2023   11:10 AM 04/21/2023    2:05 PM  Readmission Risk Prevention Plan  Transportation Screening Complete Complete Complete  PCP or Specialist Appt within 5-7 Days Complete    PCP or Specialist Appt within 3-5 Days  Complete  Complete  Home Care Screening Complete    Medication Review (RN CM) Complete    HRI or Home Care Consult  Complete Complete  Social Work Consult for Recovery Care Planning/Counseling  Complete Complete  Palliative Care Screening  Not Applicable Not Applicable  Medication Review Oceanographer)  Complete Complete

## 2023-11-20 ENCOUNTER — Encounter (HOSPITAL_COMMUNITY)
Admission: EM | Disposition: A | Discharge status: Skilled Nursing Facility | Payer: Self-pay | Source: Skilled Nursing Facility | Attending: Internal Medicine

## 2023-11-20 ENCOUNTER — Inpatient Hospital Stay (HOSPITAL_COMMUNITY): Admitting: Anesthesiology

## 2023-11-20 DIAGNOSIS — K2970 Gastritis, unspecified, without bleeding: Secondary | ICD-10-CM | POA: Diagnosis not present

## 2023-11-20 DIAGNOSIS — D62 Acute posthemorrhagic anemia: Secondary | ICD-10-CM | POA: Diagnosis not present

## 2023-11-20 DIAGNOSIS — K317 Polyp of stomach and duodenum: Secondary | ICD-10-CM | POA: Diagnosis not present

## 2023-11-20 DIAGNOSIS — K922 Gastrointestinal hemorrhage, unspecified: Secondary | ICD-10-CM | POA: Diagnosis not present

## 2023-11-20 HISTORY — PX: ENTEROSCOPY: SHX5533

## 2023-11-20 HISTORY — PX: ESOPHAGOGASTRODUODENOSCOPY: SHX5428

## 2023-11-20 LAB — BASIC METABOLIC PANEL WITH GFR
Anion gap: 6 (ref 5–15)
BUN: 13 mg/dL (ref 8–23)
CO2: 23 mmol/L (ref 22–32)
Calcium: 7.9 mg/dL — ABNORMAL LOW (ref 8.9–10.3)
Chloride: 112 mmol/L — ABNORMAL HIGH (ref 98–111)
Creatinine, Ser: <0.3 mg/dL — ABNORMAL LOW (ref 0.44–1.00)
Glucose, Bld: 130 mg/dL — ABNORMAL HIGH (ref 70–99)
Potassium: 3.8 mmol/L (ref 3.5–5.1)
Sodium: 141 mmol/L (ref 135–145)

## 2023-11-20 LAB — PREPARE RBC (CROSSMATCH)

## 2023-11-20 LAB — PROTIME-INR
INR: 1.4 — ABNORMAL HIGH (ref 0.8–1.2)
Prothrombin Time: 16.9 s — ABNORMAL HIGH (ref 11.4–15.2)

## 2023-11-20 LAB — MAGNESIUM: Magnesium: 2 mg/dL (ref 1.7–2.4)

## 2023-11-20 LAB — CBC
HCT: 22.5 % — ABNORMAL LOW (ref 36.0–46.0)
Hemoglobin: 7.1 g/dL — ABNORMAL LOW (ref 12.0–15.0)
MCH: 31.8 pg (ref 26.0–34.0)
MCHC: 31.6 g/dL (ref 30.0–36.0)
MCV: 100.9 fL — ABNORMAL HIGH (ref 80.0–100.0)
Platelets: 148 10*3/uL — ABNORMAL LOW (ref 150–400)
RBC: 2.23 MIL/uL — ABNORMAL LOW (ref 3.87–5.11)
RDW: 17.7 % — ABNORMAL HIGH (ref 11.5–15.5)
WBC: 7.1 10*3/uL (ref 4.0–10.5)
nRBC: 0.3 % — ABNORMAL HIGH (ref 0.0–0.2)

## 2023-11-20 LAB — GLUCOSE, CAPILLARY
Glucose-Capillary: 105 mg/dL — ABNORMAL HIGH (ref 70–99)
Glucose-Capillary: 126 mg/dL — ABNORMAL HIGH (ref 70–99)
Glucose-Capillary: 129 mg/dL — ABNORMAL HIGH (ref 70–99)
Glucose-Capillary: 149 mg/dL — ABNORMAL HIGH (ref 70–99)
Glucose-Capillary: 99 mg/dL (ref 70–99)

## 2023-11-20 LAB — CALCIUM, IONIZED: Calcium, Ionized, Serum: 4.7 mg/dL (ref 4.5–5.6)

## 2023-11-20 LAB — PHOSPHORUS: Phosphorus: 1.8 mg/dL — ABNORMAL LOW (ref 2.5–4.6)

## 2023-11-20 SURGERY — EGD (ESOPHAGOGASTRODUODENOSCOPY)
Anesthesia: Monitor Anesthesia Care

## 2023-11-20 MED ORDER — VITAMIN K1 10 MG/ML IJ SOLN
5.0000 mg | Freq: Once | INTRAVENOUS | Status: AC
  Administered 2023-11-20: 5 mg via INTRAVENOUS
  Filled 2023-11-20: qty 0.5

## 2023-11-20 MED ORDER — ROPINIROLE HCL 1 MG PO TABS
0.5000 mg | ORAL_TABLET | Freq: Once | ORAL | Status: AC
  Administered 2023-11-20: 0.5 mg via ORAL
  Filled 2023-11-20: qty 1

## 2023-11-20 MED ORDER — POTASSIUM PHOSPHATES 15 MMOLE/5ML IV SOLN
30.0000 mmol | Freq: Once | INTRAVENOUS | Status: AC
  Administered 2023-11-20: 30 mmol via INTRAVENOUS
  Filled 2023-11-20: qty 10

## 2023-11-20 MED ORDER — PROPOFOL 10 MG/ML IV BOLUS
INTRAVENOUS | Status: DC | PRN
  Administered 2023-11-20: 100 ug/kg/min via INTRAVENOUS
  Administered 2023-11-20: 20 ug via INTRAVENOUS

## 2023-11-20 MED ORDER — SODIUM CHLORIDE 0.9% IV SOLUTION: Freq: Once | INTRAVENOUS | Status: AC

## 2023-11-20 NOTE — Anesthesia Postprocedure Evaluation (Signed)
 Anesthesia Post Note  Patient: [name]  Procedure(s) Performed: EGD (ESOPHAGOGASTRODUODENOSCOPY) ENTEROSCOPY     Patient location during evaluation: PACU Anesthesia Type: MAC Level of consciousness: awake Pain management: pain level controlled Vital Signs Assessment: post-procedure vital signs reviewed and stable Respiratory status: spontaneous breathing, nonlabored ventilation and respiratory function stable Cardiovascular status: stable and blood pressure returned to baseline Postop Assessment: no apparent nausea or vomiting Anesthetic complications: no   No notable events documented.  Last Vitals:  Vitals:   11/20/23 1530 11/20/23 1540  BP: (!) 127/52 (!) 143/60  Pulse: 91 95  Resp: (!) 21 (!) 22  Temp:    SpO2: 100% 97%    Last Pain:  Vitals:   11/20/23 1540  TempSrc:   PainSc: 0-No pain                 Conard Decent

## 2023-11-20 NOTE — Op Note (Signed)
 [facility] Patient Name: [name] Procedure Date: 11/20/2023 MRN: [id number] Attending MD: [name] , MD, [id number] Date of Birth: 02-14-1941 CSN: [id number] Age: 83 Admit Type: Inpatient Procedure:                Small bowel enteroscopy Indications:              Acute post hemorrhagic anemia, Melena Providers:                [name], MD, Suzann Ernst, RN, Nicki Barnacle, Technician, Phebe Brasil, CRNA Referring MD:             hospital team Medicines:                Propofol  per Anesthesia, Monitored Anesthesia Care Complications:            No immediate complications. Estimated Blood Loss:     Estimated blood loss: none. Procedure:                After obtaining informed consent, the endoscope was                            passed under direct vision. Throughout the                            procedure, the patient's blood pressure, pulse, and                            oxygen  saturations were monitored continuously. The                            [id number]-Q180 ([id number]) Olympus enteroscope was                            introduced through the mouth and advanced to the                            proximal jejunum. The small bowel enteroscopy was                            accomplished without difficulty. The patient                            tolerated the procedure well. Scope In: Scope Out: Findings:      The examined esophagus was normal.      Patchy mild inflammation characterized by congestion (edema), erosions       and erythema was found in the gastric antrum.      There was no evidence of significant pathology in the duodenal bulb, in       the second portion of the duodenum, in the third portion of the duodenum       and in the fourth portion of the duodenum.      There was no evidence of significant pathology in the proximal jejunum.      Multiple small sessile polyps with no bleeding and no stigmata  of  recent       bleeding were found in the gastric fundus and in the gastric body. Impression:               - Normal esophagus.                           - Gastritis.                           - Normal duodenal bulb, second portion of the                            duodenum, third portion of the duodenum and fourth                            portion of the duodenum.                           - The examined portion of the jejunum was normal.                           - Multiple gastric polyps.                           - No specimens collected. Recommendation:           - No bleeding source seen. May need a double                            balloon enteroscopy at a tertiary care center if                            anemia continues to occur. Procedure Code(s):        --- Professional ---                           [phone], Small intestinal endoscopy, enteroscopy                            beyond second portion of duodenum, not including                            ileum; diagnostic, including collection of                            specimen(s) by brushing or washing, when performed                            (separate procedure) Diagnosis Code(s):        --- Professional ---                           K92.1, Melena (includes Hematochezia)                           D62, Acute posthemorrhagic anemia  K31.7, Polyp of stomach and duodenum                           K29.70, Gastritis, unspecified, without bleeding CPT copyright 2022 American Medical Association. All rights reserved. The codes documented in this report are preliminary and upon coder review may  be revised to meet current compliance requirements. [name], MD 11/20/2023 3:33:39 PM This report has been signed electronically. Number of Addenda: 0

## 2023-11-20 NOTE — Interval H&P Note (Signed)
 History and Physical Interval Note:  11/20/2023 1:21 PM  [name]  has presented today for surgery, with the diagnosis of Gastrointestinal Bleeding.  The various methods of treatment have been discussed with the patient and family. After consideration of risks, benefits and other options for treatment, the patient has consented to  Procedure(s): EGD (ESOPHAGOGASTRODUODENOSCOPY) (N/A) ENTEROSCOPY (N/A) as a surgical intervention.  The patient's history has been reviewed, patient examined, no change in status, stable for surgery.  I have reviewed the patient's chart and labs.  Questions were answered to the patient's satisfaction.     [name]

## 2023-11-20 NOTE — Op Note (Signed)
 [facility] Patient Name: [name] Procedure Date: 11/20/2023 MRN: [id number] Attending MD: [name] , MD, [id number] Date of Birth: 1941-01-12 CSN: [id number] Age: 83 Admit Type: Inpatient Procedure:                Upper GI endoscopy Indications:              Acute post hemorrhagic anemia, Melena Providers:                [name], MD, Suzann Ernst, RN, Nicki Barnacle, Technician, Phebe Brasil, CRNA Referring MD:             hospital team Medicines:                Propofol  per Anesthesia, Monitored Anesthesia Care Complications:            No immediate complications. Estimated Blood Loss:     Estimated blood loss: none. Procedure:                Pre-Anesthesia Assessment:                           - Prior to the procedure, a History and Physical                            was performed, and patient medications and                            allergies were reviewed. The patient's tolerance of                            previous anesthesia was also reviewed. The risks                            and benefits of the procedure and the sedation                            options and risks were discussed with the patient.                            All questions were answered, and informed consent                            was obtained. Prior Anticoagulants: The patient has                            taken no anticoagulant or antiplatelet agents. ASA                            Grade Assessment: III - A patient with severe                            systemic disease. After reviewing the risks and  benefits, the patient was deemed in satisfactory                            condition to undergo the procedure.                           After obtaining informed consent, the endoscope was                            passed under direct vision. Throughout the                            procedure, the  patient's blood pressure, pulse, and                            oxygen  saturations were monitored continuously. The                            [id number]-H190 ([id number]) Olympus endoscope was introduced                            through the mouth, and advanced to the second part                            of duodenum. The upper GI endoscopy was                            accomplished without difficulty. The patient                            tolerated the procedure well. Scope In: Scope Out: Findings:      The Z-line was regular and was found 40 cm from the incisors.      The examined esophagus was normal.      Patchy mild inflammation characterized by congestion (edema), erosions       and erythema was found in the gastric antrum.      The examined duodenum was normal.      Multiple small sessile polyps with no bleeding and no stigmata of recent       bleeding were found in the gastric fundus and in the gastric body. Impression:               - Z-line regular, 40 cm from the incisors.                           - Normal esophagus.                           - Gastritis.                           - Normal examined duodenum.                           - Multiple gastric polyps.                           -  No specimens collected. Moderate Sedation:      N/A - MAC procedure Recommendation:           - See other procedure note.                           - Observe patient's clinical course. Procedure Code(s):        --- Professional ---                           [phone], Esophagogastroduodenoscopy, flexible,                            transoral; diagnostic, including collection of                            specimen(s) by brushing or washing, when performed                            (separate procedure) Diagnosis Code(s):        --- Professional ---                           K92.1, Melena (includes Hematochezia)                           D62, Acute posthemorrhagic anemia                            K31.7, Polyp of stomach and duodenum                           K29.70, Gastritis, unspecified, without bleeding CPT copyright 2022 American Medical Association. All rights reserved. The codes documented in this report are preliminary and upon coder review may  be revised to meet current compliance requirements. [name], MD 11/20/2023 3:28:04 PM This report has been signed electronically. Number of Addenda: 0

## 2023-11-20 NOTE — Progress Notes (Signed)
 PROGRESS NOTE    [name]  XBJ:[id number] DOB: 27-Mar-1941 DOA: 11/18/2023 PCP: Arnetha Bhat, NP    Brief Narrative:  83 year old with history of anxiety, depression, chronic diastolic CHF, OA, glaucoma, HLD, HTN, pulmonary nodule, RLS, osteoarthritis, COPD, GERD, diverticular bleed with history of embolization, upper GI bleed, chronic anemia, gastritis comes to the hospital for multiple episodes of hematochezia.  Upon admission hemoglobin noted to be 5.3.  Video capsule shows mild gastritis, nodular area in duodenal bulb without any active bleeding.  Plans for EGD with possible enteroscopy   Assessment & Plan:  Principal Problem:   Acute GI bleeding Active Problems:   Depression with anxiety   Hyperlipidemia   Glaucoma   Diverticulosis of intestine with bleeding   COPD (chronic obstructive pulmonary disease) (HCC)   Iron  deficiency anemia   Hypertension   GERD (gastroesophageal reflux disease)   Diastolic heart failure (HCC)   Class 1 obesity   Thoracic aorta atherosclerosis (HCC)   Hematochezia Lower GI bleed History of multiple GI bleed Acute on chronic anemia -Baseline hemoglobin 9.5, admission hemoglobin 5.3 requiring 3 unit PRBC transfusion.  Continue transfusion as necessary. Video capsule shows mild gastritis, nodular area in duodenal bulb without any active bleeding.  Plans for EGD with possible enteroscopy. INR 1.4, maybe we can do a dose of Vit k, but not sure how much it will help.  - PPI IV twice daily - [facility] following  Colonoscopy 09/2023-hemorrhoids, ischemic colitis, multiple polyps, diverticulosis Endoscopy 09/2023-gastritis, gastric polyps  Hypokalemia -As needed repletion.  Check mag Phos and ionized calcium   Depression - Statin  Glaucoma - Eyedrops  COPD - Not an active exacerbation.  As needed bronchodilators  Essential hypertension - On metoprolol .  IV as needed  GERD - PPI  Pulmonary nodule - Follow-up outpatient  clinic  Congestive heart failure with preserved ejection fraction - No signs of volume overload  Osteoarthritis - Pain control  Restless leg syndrome - Requip   Depression - Zoloft   Currently multiple home medications are on hold.  Will resume when appropriate  DVT prophylaxis: SCDs Start: 11/18/23 3086 DNR/DNI Family Communication: Son updated.  Status is: Inpatient Ongoing GI evaluation    Subjective:  Seen and examined at bedside, overall feels tired but no other complaints at this time  Examination:  General exam: Appears calm and comfortable, hard of hearing Respiratory system: Clear to auscultation. Respiratory effort normal. Cardiovascular system: S1 & S2 heard, RRR. No JVD, murmurs, rubs, gallops or clicks. No pedal edema. Gastrointestinal system: Abdomen is nondistended, soft and nontender. No organomegaly or masses felt. Normal bowel sounds heard. Central nervous system: Alert and oriented. No focal neurological deficits. Extremities: Symmetric 5 x 5 power. Skin: No rashes, lesions or ulcers Psychiatry: Judgement and insight appear normal. Mood & affect appropriate.                Diet Orders (From admission, onward)     Start     Ordered   11/20/23 0001  Diet NPO time specified Except for: Sips with Meds  Diet effective midnight       Question:  Except for  Answer:  Sips with Meds   11/19/23 2355            Objective: Vitals:   11/20/23 0900 11/20/23 0929 11/20/23 0958 11/20/23 1000  BP: (!) 124/46 (!) 115/43 110/89 110/89  Pulse: 83 99 90 97  Resp: (!) 22 (!) 22 (!) 22 (!) 26  Temp:  97.8 F (  36.6 C) 98 F (36.7 C)   TempSrc:  Oral Oral   SpO2: 96% 97% 96% 98%  Weight:      Height:        Intake/Output Summary (Last 24 hours) at 11/20/2023 1115 Last data filed at 11/20/2023 0729 Gross per 24 hour  Intake 1232.43 ml  Output 550 ml  Net 682.43 ml   Filed Weights   11/18/23 0154 11/18/23 0947 11/18/23 1100  Weight: 77.2 kg  77.2 kg 77.4 kg    Scheduled Meds:  Chlorhexidine  Gluconate Cloth  6 each Topical Daily   cyanocobalamin   1,000 mcg Oral Daily   pantoprazole  (PROTONIX ) IV  40 mg Intravenous Q12H   sertraline   75 mg Oral Daily   simvastatin   20 mg Oral QPM   Continuous Infusions:  sodium chloride  Stopped (11/20/23 0123)   phytonadione (VITAMIN K) 5 mg in dextrose  5 % 50 mL IVPB     potassium PHOSPHATE IVPB (in mmol) 85 mL/hr at 11/20/23 1610    Nutritional status     Body mass index is 32.24 kg/m.  Data Reviewed:   CBC: Recent Labs  Lab 11/18/23 0210 11/18/23 1025 11/18/23 1749 11/18/23 2035 11/19/23 0302 11/20/23 0254  WBC 10.3  --   --   --  8.3 7.1  NEUTROABS 6.9  --   --   --   --   --   HGB 5.3* 5.4* 7.9* 7.7* 7.3* 7.1*  HCT 17.0* 16.7* 23.2* 23.8* 22.0* 22.5*  MCV 103.7*  --   --   --  98.2 100.9*  PLT 179  --   --   --  132* 148*   Basic Metabolic Panel: Recent Labs  Lab 11/18/23 0210 11/18/23 1025 11/19/23 0302 11/19/23 0904 11/20/23 0254  NA 135 138 137  --  141  K 3.4* 3.0* 2.9*  --  3.8  CL 101 105 107  --  112*  CO2 24 25 25   --  23  GLUCOSE 153* 148* 123*  --  130*  BUN 28* 25* 22  --  13  CREATININE 0.60 0.57 0.38*  --  <0.30*  CALCIUM  8.0* 7.4* 7.5*  --  7.9*  MG  --  1.9  --   --  2.0  PHOS  --  2.6  --  2.2* 1.8*   GFR: CrCl cannot be calculated (This lab value cannot be used to calculate CrCl because it is not a number: <0.30). Liver Function Tests: Recent Labs  Lab 11/18/23 0210 11/19/23 0302  AST 15 14*  ALT 10 8  ALKPHOS 40 29*  BILITOT 0.7 0.8  PROT 5.5* 4.5*  ALBUMIN 3.3* 2.6*   No results for input(s): "LIPASE", "AMYLASE" in the last 168 hours. No results for input(s): "AMMONIA" in the last 168 hours. Coagulation Profile: Recent Labs  Lab 11/20/23 0848  INR 1.4*   Cardiac Enzymes: No results for input(s): "CKTOTAL", "CKMB", "CKMBINDEX", "TROPONINI" in the last 168 hours. BNP (last 3 results) No results for input(s): "PROBNP"  in the last 8760 hours. HbA1C: No results for input(s): "HGBA1C" in the last 72 hours. CBG: Recent Labs  Lab 11/19/23 1157 11/19/23 1755 11/20/23 0017 11/20/23 0651  GLUCAP 141* 154* 126* 129*   Lipid Profile: No results for input(s): "CHOL", "HDL", "LDLCALC", "TRIG", "CHOLHDL", "LDLDIRECT" in the last 72 hours. Thyroid  Function Tests: No results for input(s): "TSH", "T4TOTAL", "FREET4", "T3FREE", "THYROIDAB" in the last 72 hours. Anemia Panel: No results for input(s): "VITAMINB12", "FOLATE", "FERRITIN", "TIBC", "IRON ", "  RETICCTPCT" in the last 72 hours. Sepsis Labs: No results for input(s): "PROCALCITON", "LATICACIDVEN" in the last 168 hours.  Recent Results (from the past 240 hours)  MRSA Next Gen by PCR, Nasal     Status: None   Collection Time: 11/18/23 11:30 AM   Specimen: Nasal Mucosa; Nasal Swab  Result Value Ref Range Status   MRSA by PCR Next Gen NOT DETECTED NOT DETECTED Final    Comment: (NOTE) The GeneXpert MRSA Assay (FDA approved for NASAL specimens only), is one component of a comprehensive MRSA colonization surveillance program. It is not intended to diagnose MRSA infection nor to guide or monitor treatment for MRSA infections. Test performance is not FDA approved in patients less than 38 years old. Performed at [facility], [address], [place], [place] [address]          Radiology Studies: No results found.         LOS: 2 days   Time spent= 35 mins    Maggie Schooner, MD [facility]  If 7PM-7AM, please contact night-coverage  11/20/2023, 11:15 AM

## 2023-11-20 NOTE — Anesthesia Preprocedure Evaluation (Signed)
 Anesthesia Evaluation  Patient identified by MRN, date of birth, ID band Patient awake    Reviewed: Allergy & Precautions, NPO status , Patient's Chart, lab work & pertinent test results  Airway Mallampati: II  TM Distance: >3 FB Neck ROM: Limited    Dental   Pulmonary COPD, former smoker   breath sounds clear to auscultation       Cardiovascular hypertension, + DOE   Rhythm:Regular Rate:Normal     Neuro/Psych negative neurological ROS     GI/Hepatic Neg liver ROS,GERD  ,,GI bleed   Endo/Other  negative endocrine ROS    Renal/GU negative Renal ROS     Musculoskeletal  (+) Arthritis ,    Abdominal   Peds  Hematology  (+) Blood dyscrasia, anemia   Anesthesia Other Findings   Reproductive/Obstetrics                             Anesthesia Physical Anesthesia Plan  ASA: 3  Anesthesia Plan: MAC   Post-op Pain Management:    Induction:   PONV Risk Score and Plan: 2 and Propofol  infusion  Airway Management Planned: Natural Airway and Nasal Cannula  Additional Equipment:   Intra-op Plan:   Post-operative Plan:   Informed Consent: I have reviewed the patients History and Physical, chart, labs and discussed the procedure including the risks, benefits and alternatives for the proposed anesthesia with the patient or authorized representative who has indicated his/her understanding and acceptance.   Patient has DNR.  Discussed DNR with patient and Suspend DNR.     Plan Discussed with:   Anesthesia Plan Comments:        Anesthesia Quick Evaluation

## 2023-11-20 NOTE — Plan of Care (Signed)
  Problem: Education: Goal: Knowledge of General Education information will improve Description: Including pain rating scale, medication(s)/side effects and non-pharmacologic comfort measures Outcome: Progressing   Problem: Clinical Measurements: Goal: Diagnostic test results will improve Outcome: Progressing   

## 2023-11-20 NOTE — Progress Notes (Signed)
 eLink Physician-Brief Progress Note Patient Name: EMMARY CULBREATH DOB: 1940-12-24 MRN: [id number]   Date of Service  11/20/2023  HPI/Events of Note  Phos 1.8  eICU Interventions  Kphos     Intervention Category Minor Interventions: Electrolytes abnormality - evaluation and management  Jeanet Lupe 11/20/2023, 5:26 AM

## 2023-11-20 NOTE — Plan of Care (Signed)
  Problem: Education: Goal: Knowledge of General Education information will improve Description: Including pain rating scale, medication(s)/side effects and non-pharmacologic comfort measures Outcome: Progressing   Problem: Clinical Measurements: Goal: Respiratory complications will improve Outcome: Progressing   Problem: Elimination: Goal: Will not experience complications related to urinary retention Outcome: Progressing   Problem: Health Behavior/Discharge Planning: Goal: Ability to manage health-related needs will improve Outcome: Not Progressing   Problem: Clinical Measurements: Goal: Cardiovascular complication will be avoided Outcome: Not Progressing   Problem: Activity: Goal: Risk for activity intolerance will decrease Outcome: Not Progressing   Problem: Coping: Goal: Level of anxiety will decrease Outcome: Not Progressing   Problem: Elimination: Goal: Will not experience complications related to bowel motility Outcome: Not Progressing   Problem: Pain Managment: Goal: General experience of comfort will improve and/or be controlled Outcome: Not Progressing   Problem: Skin Integrity: Goal: Risk for impaired skin integrity will decrease Outcome: Not Progressing

## 2023-11-20 NOTE — Transfer of Care (Signed)
 Immediate Anesthesia Transfer of Care Note  Patient: [name]  Procedure(s) Performed: EGD (ESOPHAGOGASTRODUODENOSCOPY) ENTEROSCOPY  Patient Location: Endoscopy Unit  Anesthesia Type:MAC  Level of Consciousness: sedated  Airway & Oxygen  Therapy: Patient Spontanous Breathing and Patient connected to nasal cannula oxygen   Post-op Assessment: Report given to RN and Post -op Vital signs reviewed and stable  Post vital signs: Reviewed and stable  Last Vitals:  Vitals Value Taken Time  BP    Temp    Pulse    Resp 21 11/20/23 1525  SpO2    Vitals shown include unfiled device data.  Last Pain:  Vitals:   11/20/23 1304  TempSrc: Temporal  PainSc: 0-No pain         Complications: No notable events documented.

## 2023-11-21 DIAGNOSIS — K922 Gastrointestinal hemorrhage, unspecified: Secondary | ICD-10-CM | POA: Diagnosis not present

## 2023-11-21 LAB — CBC
HCT: 25.8 % — ABNORMAL LOW (ref 36.0–46.0)
Hemoglobin: 7.9 g/dL — ABNORMAL LOW (ref 12.0–15.0)
MCH: 32.4 pg (ref 26.0–34.0)
MCHC: 30.6 g/dL (ref 30.0–36.0)
MCV: 105.7 fL — ABNORMAL HIGH (ref 80.0–100.0)
Platelets: UNDETERMINED 10*3/uL (ref 150–400)
RBC: 2.44 MIL/uL — ABNORMAL LOW (ref 3.87–5.11)
RDW: 17.4 % — ABNORMAL HIGH (ref 11.5–15.5)
WBC: 5.6 10*3/uL (ref 4.0–10.5)
nRBC: 0.5 % — ABNORMAL HIGH (ref 0.0–0.2)

## 2023-11-21 LAB — BASIC METABOLIC PANEL WITH GFR
Anion gap: 6 (ref 5–15)
BUN: 12 mg/dL (ref 8–23)
CO2: 22 mmol/L (ref 22–32)
Calcium: 7.9 mg/dL — ABNORMAL LOW (ref 8.9–10.3)
Chloride: 112 mmol/L — ABNORMAL HIGH (ref 98–111)
Creatinine, Ser: <0.3 mg/dL — ABNORMAL LOW (ref 0.44–1.00)
Glucose, Bld: 103 mg/dL — ABNORMAL HIGH (ref 70–99)
Potassium: 4.2 mmol/L (ref 3.5–5.1)
Sodium: 140 mmol/L (ref 135–145)

## 2023-11-21 LAB — GLUCOSE, CAPILLARY
Glucose-Capillary: 117 mg/dL — ABNORMAL HIGH (ref 70–99)
Glucose-Capillary: 140 mg/dL — ABNORMAL HIGH (ref 70–99)
Glucose-Capillary: 147 mg/dL — ABNORMAL HIGH (ref 70–99)

## 2023-11-21 LAB — MAGNESIUM: Magnesium: 2.1 mg/dL (ref 1.7–2.4)

## 2023-11-21 MED ORDER — ROPINIROLE HCL 1 MG PO TABS: 0.5000 mg | ORAL_TABLET | Freq: Every day | ORAL | Status: DC | PRN

## 2023-11-21 MED ORDER — FERROUS GLUCONATE 324 (38 FE) MG PO TABS
324.0000 mg | ORAL_TABLET | Freq: Every morning | ORAL | Status: DC
  Administered 2023-11-21 – 2023-11-26 (×6): 324 mg via ORAL
  Filled 2023-11-21 (×6): qty 1

## 2023-11-21 NOTE — Progress Notes (Signed)
 [facility] Gastroenterology Progress Note  [name] 83 y.o. 1940/07/25   Subjective: Sitting in bedside chair. Denies abdominal pain. No rectal bleeding.  Objective: Vital signs: Vitals:   11/21/23 0846 11/21/23 1204  BP:    Pulse:    Resp:    Temp: 99.1 F (37.3 C) 98.1 F (36.7 C)  SpO2:    P 90, BP 94/57  Physical Exam: Gen: lethargic, elderly, well-nourished, no acute distress  HEENT: anicteric sclera CV: RRR Chest: CTA B Abd: soft, nontender, nondistended, +BS Ext: no edema  Lab Results: Recent Labs    11/19/23 0904 11/20/23 0254 11/21/23 0310  NA  --  141 140  K  --  3.8 4.2  CL  --  112* 112*  CO2  --  23 22  GLUCOSE  --  130* 103*  BUN  --  13 12  CREATININE  --  <0.30* <0.30*  CALCIUM   --  7.9* 7.9*  MG  --  2.0 2.1  PHOS 2.2* 1.8*  --    Recent Labs    11/19/23 0302  AST 14*  ALT 8  ALKPHOS 29*  BILITOT 0.8  PROT 4.5*  ALBUMIN 2.6*   Recent Labs    11/20/23 0254 11/21/23 0310  WBC 7.1 5.6  HGB 7.1* 7.9*  HCT 22.5* 25.8*  MCV 100.9* 105.7*  PLT 148* PLATELET CLUMPS NOTED ON SMEAR, UNABLE TO ESTIMATE      Assessment/Plan: Obscure GI bleeding - unrevealing EGD and enteroscopy yesterday. If bleeding recurs or anemia significantly worsens then will need referral to tertiary care center for single or double balloon enteroscopy otherwise manage conservatively. Will sign off. Call if questions. Dr. Kimble Pennant available this weekend if needed.   [name] 11/21/2023, 2:51 PM  Questions please call [phone] ID: [name], female   DOB: 12-13-1940, 83 y.o.   MRN: [id number]

## 2023-11-21 NOTE — Evaluation (Signed)
 Occupational Therapy Evaluation Patient Details Name: [name] MRN: [id number] DOB: October 09, 1940 Today's Date: 11/21/2023   History of Present Illness   [name] is a 83 year old female who presented to the hospital with hematochezia and low hgb. Patient was admitted with LGI bleed.  [name], depression, chronic diastolic heart failure, osteoarthritis, glaucoma, hyperlipidemia, hypertension, pulmonary nodule, restless leg syndrome, osteoporosis, COPD, aortic arthrosclerosis, GERD, history of diverticular bleed with embolization, UGI bleed, chronic iron  deficiency anemia     Clinical Impressions PTA, patient lives in [facility] and was using RW for mobility and support from staff for A/IADL's. Currently, patient presents with deficits outlined below (see OT Problem List for details) most significantly generalized muscle weakness, decreased activity tolerance, posture, balance and low vision limiting BADL's (max-total A LB) and functional mobility (mod A bed to chair +2). Patient requires skilled OT while in Acute hospital setting to progress function and safety. Patient will benefit from continued inpatient follow up therapy, <3 hours/day.       If plan is discharge home, recommend the following:   Two people to help with walking and/or transfers;Two people to help with bathing/dressing/bathroom;Direct supervision/assist for medications management;Direct supervision/assist for financial management;Assist for transportation;Help with stairs or ramp for entrance     Functional Status Assessment   Patient has had a recent decline in their functional status and demonstrates the ability to make significant improvements in function in a reasonable and predictable amount of time.     Equipment Recommendations   None recommended by OT      Precautions/Restrictions   Precautions Precautions: Fall Restrictions Weight Bearing Restrictions Per Provider Order: No      Mobility Bed Mobility Overal bed mobility: Needs Assistance Bed Mobility: Rolling, Supine to Sit Rolling: Mod assist, Used rails   Supine to sit: Mod assist, HOB elevated, Used rails     General bed mobility comments: assist for lines and cues to reach for rails with increased time    Transfers Overall transfer level: Needs assistance Equipment used: Rolling walker (2 wheels) Transfers: Sit to/from Stand, Bed to chair/wheelchair/BSC Sit to Stand: Mod assist, +2 physical assistance, +2 safety/equipment, From elevated surface     Step pivot transfers: Mod assist, +2 physical assistance, +2 safety/equipment, From elevated surface     General transfer comment: increased time with heavy reliance on RW      Balance Overall balance assessment: Needs assistance Sitting-balance support: Single extremity supported, Feet supported Sitting balance-Leahy Scale: Fair   Postural control: Other (comment) (significant forward head) Standing balance support: Bilateral upper extremity supported, Reliant on assistive device for balance Standing balance-Leahy Scale: Poor                             ADL either performed or assessed with clinical judgement   ADL Overall ADL's : Needs assistance/impaired Eating/Feeding: Set up;Sitting   Grooming: Wash/dry face;Sitting;Contact guard assist   Upper Body Bathing: Maximal assistance;Bed level   Lower Body Bathing: Total assistance;Bed level   Upper Body Dressing : Maximal assistance;Bed level   Lower Body Dressing: Total assistance;Bed level   Toilet Transfer:  (deferred commode due to just having BM incontinence and no voiding need)   Toileting- Clothing Manipulation and Hygiene: Total assistance       Functional mobility during ADLs: Moderate assistance;Rolling walker (2 wheels);Cueing for safety;+2 for physical assistance;+2 for safety/equipment (bed to recliner with RW) General ADL Comments: poor tolerance at  present  for functioanl reach to lower body     Vision Baseline Vision/History: 2 Legally blind;3 Glaucoma;6 Macular Degeneration Ability to See in Adequate Light: 3 Highly impaired Patient Visual Report: Other (comment) (reports able to see light and shadows/vague image on TV)       Perception Perception: Impaired   Perception-Other Comments: due to low acuity   Praxis Praxis: WFL       Pertinent Vitals/Pain Pain Assessment Pain Assessment: Faces Faces Pain Scale: No hurt     Extremity/Trunk Assessment Upper Extremity Assessment Upper Extremity Assessment: Generalized weakness;Right hand dominant   Lower Extremity Assessment Lower Extremity Assessment: Defer to PT evaluation   Cervical / Trunk Assessment Cervical / Trunk Assessment: Kyphotic   Communication Communication Communication: Impaired Factors Affecting Communication: Hearing impaired   Cognition Arousal: Alert Behavior During Therapy: WFL for tasks assessed/performed Cognition: No apparent impairments             OT - Cognition Comments: cues for exact day of week otherwise appears intact                 Following commands: Intact       Cueing  General Comments   Cueing Techniques: Verbal cues  VSR pre- 101/46 map(72) SpO2 95% HR 110 RR 25; post: 127/42 (map 74) HR 108 RR 24 SpO2 95% on RA           Home Living Family/patient expects to be discharged to:: Assisted living                             Home Equipment: Agricultural consultant (2 wheels)   Additional Comments: pt uses RW, regular toilet with rails      Prior Functioning/Environment Prior Level of Function : Needs assist       Physical Assist : Mobility (physical);ADLs (physical) Mobility (physical): Gait ADLs (physical): Bathing;Dressing;IADLs Mobility Comments: patient reports indep RW use at ALF ADLs Comments: has assist for bathing and LB self care as well as all IADL's    OT Problem List: Decreased  strength;Decreased activity tolerance;Impaired balance (sitting and/or standing);Impaired vision/perception;Decreased knowledge of use of DME or AE;Decreased knowledge of precautions;Cardiopulmonary status limiting activity;Obesity   OT Treatment/Interventions: Self-care/ADL training;Therapeutic exercise;Neuromuscular education;Energy conservation;DME and/or AE instruction;Therapeutic activities;Visual/perceptual remediation/compensation;Patient/family education;Balance training      OT Goals(Current goals can be found in the care plan section)   Acute Rehab OT Goals Patient Stated Goal: to get stronger OT Goal Formulation: With patient Time For Goal Achievement: 11/23/23 Potential to Achieve Goals: Good ADL Goals Pt Will Perform Grooming: with set-up;sitting Pt Will Perform Upper Body Bathing: with set-up;sitting Pt Will Perform Upper Body Dressing: with set-up;sitting Pt Will Transfer to Toilet: with contact guard assist;bedside commode Pt Will Perform Toileting - Clothing Manipulation and hygiene: with min assist;sitting/lateral leans Pt/caregiver will Perform Home Exercise Program: Increased strength;Both right and left upper extremity;With Supervision   OT Frequency:  Min 2X/week    Co-evaluation   Reason for Co-Treatment: For patient/therapist safety PT goals addressed during session: Mobility/safety with mobility        AM-PAC OT "6 Clicks" Daily Activity     Outcome Measure Help from another person eating meals?: A Little Help from another person taking care of personal grooming?: A Little Help from another person toileting, which includes using toliet, bedpan, or urinal?: Total Help from another person bathing (including washing, rinsing, drying)?: A Lot Help from another person to put on  and taking off regular upper body clothing?: A Lot Help from another person to put on and taking off regular lower body clothing?: Total 6 Click Score: 12   End of Session Equipment  Utilized During Treatment: Gait belt;Rolling walker (2 wheels) Nurse Communication: Mobility status  Activity Tolerance: Patient limited by fatigue Patient left: in chair;with chair alarm set;with call bell/phone within reach  OT Visit Diagnosis: Unsteadiness on feet (R26.81);Muscle weakness (generalized) (M62.81);Low vision, both eyes (H54.2)                Time: 1610-9604 OT Time Calculation (min): 27 min Charges:  OT General Charges $OT Visit: 1 Visit OT Evaluation $OT Eval Moderate Complexity: 1 Mod OT Treatments $Therapeutic Activity: 8-22 mins  Pete Merten OT/L Acute Rehabilitation Department  [phone]  11/21/2023, 12:46 PM

## 2023-11-21 NOTE — Plan of Care (Signed)

## 2023-11-21 NOTE — Evaluation (Signed)
 Physical Therapy Evaluation Patient Details Name: [name] MRN: [id number] DOB: April 04, 1941 Today's Date: 11/21/2023  History of Present Illness  [name] is a 83 year old female who presented to the hospital with hematochezia and low hgb. Patient was admitted with LGI bleed.  [name], depression, chronic diastolic heart failure, osteoarthritis, glaucoma, hyperlipidemia, hypertension, pulmonary nodule, restless leg syndrome, osteoporosis, COPD, aortic arthrosclerosis, GERD, history of diverticular bleed with embolization, UGI bleed, chronic iron  deficiency anemia  Clinical Impression  Pt admitted with above diagnosis.  Pt currently with functional limitations due to the deficits listed below (see PT Problem List). Pt will benefit from acute skilled PT to increase their independence and safety with mobility to allow discharge.     The patient comes from ALF, ambulatory with a RW per patient. Patient  is legally blind so does requires assistance.  The patient did stand  at [facility] and step to the recliner with 2 person assistance.   Patient's HR 101-BP 101/48. Patient will benefit from continued inpatient follow up therapy, <3 hours/day       If plan is discharge home, recommend the following: A lot of help with walking and/or transfers;A lot of help with bathing/dressing/bathroom;Assist for transportation;Direct supervision/assist for medications management   Can travel by private vehicle        Equipment Recommendations None recommended by PT  Recommendations for Other Services       Functional Status Assessment Patient has had a recent decline in their functional status and demonstrates the ability to make significant improvements in function in a reasonable and predictable amount of time.     Precautions / Restrictions Precautions Precautions: Fall Precaution/Restrictions Comments: incontinence, legally blind, monitor  VS, soft BP Restrictions Weight Bearing Restrictions  Per Provider Order: No      Mobility  Bed Mobility   Bed Mobility: Rolling, Supine to Sit Rolling: Mod assist, Used rails   Supine to sit: Mod assist, HOB elevated, Used rails     General bed mobility comments: assist for lines and cues to reach for rails with increased time    Transfers Overall transfer level: Needs assistance Equipment used: Rolling walker (2 wheels) Transfers: Sit to/from Stand, Bed to chair/wheelchair/BSC Sit to Stand: Mod assist, +2 physical assistance, +2 safety/equipment, From elevated surface   Step pivot transfers: Mod assist, +2 physical assistance, +2 safety/equipment, From elevated surface       General transfer comment: increased time with heavy reliance on RW    Ambulation/Gait                  Stairs            Wheelchair Mobility     Tilt Bed    Modified Rankin (Stroke Patients Only)       Balance Overall balance assessment: Needs assistance Sitting-balance support: Single extremity supported, Feet supported Sitting balance-Leahy Scale: Fair     Standing balance support: Bilateral upper extremity supported, Reliant on assistive device for balance, During functional activity Standing balance-Leahy Scale: Poor                               Pertinent Vitals/Pain Pain Assessment Faces Pain Scale: No hurt    Home Living Family/patient expects to be discharged to:: Assisted living                 Home Equipment: Rolling Walker (2 wheels) Additional Comments: pt uses RW, regular  toilet with rails    Prior Function Prior Level of Function : Needs assist       Physical Assist : Mobility (physical);ADLs (physical) Mobility (physical): Gait ADLs (physical): Bathing;Dressing;IADLs Mobility Comments: patient reports indep RW use at ALF ADLs Comments: has assist for bathing and LB self care as well as all IADL's     Extremity/Trunk Assessment   Upper Extremity Assessment Upper Extremity  Assessment: Generalized weakness;Defer to OT evaluation    Lower Extremity Assessment Lower Extremity Assessment: Generalized weakness    Cervical / Trunk Assessment Cervical / Trunk Assessment: Kyphotic  Communication   Communication Communication: Impaired Factors Affecting Communication: Hearing impaired    Cognition Arousal: Alert Behavior During Therapy: WFL for tasks assessed/performed   PT - Cognitive impairments: Orientation   Orientation impairments: Time                     Following commands: Intact       Cueing Cueing Techniques: Verbal cues     General Comments General comments (skin integrity, edema, etc.): VSR pre- 101/46 map(72) SpO2 95% HR 110 RR 25; post: 127/42 (map 74) HR 108 RR 24 SpO2 95% on RA    Exercises     Assessment/Plan    PT Assessment Patient needs continued PT services  PT Problem List Decreased strength;Decreased activity tolerance;Decreased mobility;Decreased safety awareness;Cardiopulmonary status limiting activity       PT Treatment Interventions DME instruction;Therapeutic activities;Gait training;Functional mobility training;Therapeutic exercise;Patient/family education    PT Goals (Current goals can be found in the Care Plan section)  Acute Rehab PT Goals Patient Stated Goal: go back to ALF PT Goal Formulation: With patient Time For Goal Achievement: 12/05/23 Potential to Achieve Goals: Good    Frequency Min 2X/week     Co-evaluation PT/OT/SLP Co-Evaluation/Treatment: Yes Reason for Co-Treatment: For patient/therapist safety PT goals addressed during session: Mobility/safety with mobility         AM-PAC PT "6 Clicks" Mobility  Outcome Measure Help needed turning from your back to your side while in a flat bed without using bedrails?: A Lot Help needed moving from lying on your back to sitting on the side of a flat bed without using bedrails?: A Lot Help needed moving to and from a bed to a chair  (including a wheelchair)?: A Lot Help needed standing up from a chair using your arms (e.g., wheelchair or bedside chair)?: A Lot Help needed to walk in hospital room?: Total Help needed climbing 3-5 steps with a railing? : Total 6 Click Score: 10    End of Session Equipment Utilized During Treatment: Gait belt Activity Tolerance: Patient tolerated treatment well;Patient limited by fatigue Patient left: in chair;with call bell/phone within reach;with chair alarm set Nurse Communication: Mobility status PT Visit Diagnosis: Unsteadiness on feet (R26.81);Difficulty in walking, not elsewhere classified (R26.2)    Time: 1610-9604 PT Time Calculation (min) (ACUTE ONLY): 23 min   Charges:   PT Evaluation $PT Eval Low Complexity: 1 Low   PT General Charges $$ ACUTE PT VISIT: 1 Visit         Abelina Hoes PT Acute Rehabilitation Services Office [phone]   Dareen Ebbing 11/21/2023, 1:01 PM

## 2023-11-21 NOTE — Progress Notes (Signed)
 PROGRESS NOTE    ANH BIGOS  PPI:[id number] DOB: 27-Dec-1940 DOA: 11/18/2023 PCP: Arnetha Bhat, NP    Brief Narrative:  83 year old with history of anxiety, depression, chronic diastolic CHF, OA, glaucoma, HLD, HTN, pulmonary nodule, RLS, osteoarthritis, COPD, GERD, diverticular bleed with history of embolization, upper GI bleed, chronic anemia, gastritis comes to the hospital for multiple episodes of hematochezia.  Upon admission hemoglobin noted to be 5.3.  Video capsule shows mild gastritis, nodular area in duodenal bulb without any active bleeding.  EGD performed on 5/22 showed normal esophagus, gastritis, multiple gastric polyps.  No biopsies were taken.  Small bowel enteroscopy showed normal duodenal bulb.  At this time it may be beneficial for patient to get double balloon enteroscopy in the future if her anemia persists.   Assessment & Plan:  Principal Problem:   Acute GI bleeding Active Problems:   Depression with anxiety   Hyperlipidemia   Glaucoma   Diverticulosis of intestine with bleeding   COPD (chronic obstructive pulmonary disease) (HCC)   Iron  deficiency anemia   Hypertension   GERD (gastroesophageal reflux disease)   Diastolic heart failure (HCC)   Class 1 obesity   Thoracic aorta atherosclerosis (HCC)   Hematochezia Lower GI bleed History of multiple GI bleed Acute on chronic anemia -Baseline hemoglobin 9.5, admission hemoglobin 5.3 requiring 3 unit PRBC transfusion.  Continue transfusion as necessary.  Hemoglobin 7.9. -Video capsule shows mild gastritis, nodular area in duodenal bulb without any active bleeding.  EGD performed on 5/22 showed normal esophagus, gastritis, multiple gastric polyps.  No biopsies were taken.  Small bowel enteroscopy showed normal duodenal bulb.  At this time it may be beneficial for patient to get double balloon enteroscopy in the future if her anemia persists. -Due to slightly elevated INR, one-time vitamin K was given - PPI IV twice  daily - [facility] following  Colonoscopy 09/2023-hemorrhoids, ischemic colitis, multiple polyps, diverticulosis Endoscopy 09/2023-gastritis, gastric polyps  Hypokalemia -As needed repletion.  Check mag Phos and ionized calcium   Depression - Statin  Glaucoma - Eyedrops  COPD - Not an active exacerbation.  As needed bronchodilators  Essential hypertension - On metoprolol .  IV as needed  GERD - PPI  Pulmonary nodule - Follow-up outpatient clinic  Congestive heart failure with preserved ejection fraction - No signs of volume overload  Osteoarthritis - Pain control  Restless leg syndrome - Requip   Depression - Zoloft   PT/OT  DVT prophylaxis: SCDs Start: 11/18/23 0630 DNR/DNI Family Communication: Son updated.  Status is: Inpatient Ongoing GI evaluation    Subjective:  Patient tolerated endoscopy and enteroscopy on 5/22. No new complaints  HR in 120s during my visit.   Examination:  General exam: Appears calm and comfortable, hard of hearing; elderly frail Respiratory system: Clear to auscultation. Respiratory effort normal. Cardiovascular system: Sinus tachycardia.  Gastrointestinal system: Abdomen is nondistended, soft and nontender. No organomegaly or masses felt. Normal bowel sounds heard. Central nervous system: Alert and oriented. No focal neurological deficits. Extremities: Symmetric 5 x 5 power. Skin: No rashes, lesions or ulcers Psychiatry: Judgement and insight appear normal. Mood & affect appropriate.                Diet Orders (From admission, onward)     Start     Ordered   11/20/23 1607  Diet full liquid Fluid consistency: Thin  Diet effective now       Question:  Fluid consistency:  Answer:  Thin   11/20/23 1606  Objective: Vitals:   11/21/23 0500 11/21/23 0600 11/21/23 0630 11/21/23 0846  BP: (!) 146/84 (!) 102/41    Pulse: 78 77    Resp: (!) 21 (!) 22    Temp:   98.2 F (36.8 C) 99.1 F (37.3 C)   TempSrc:   Oral Oral  SpO2: 98% 94%    Weight:      Height:        Intake/Output Summary (Last 24 hours) at 11/21/2023 1045 Last data filed at 11/21/2023 0500 Gross per 24 hour  Intake 1390 ml  Output 2050 ml  Net -660 ml   Filed Weights   11/18/23 0154 11/18/23 0947 11/18/23 1100  Weight: 77.2 kg 77.2 kg 77.4 kg    Scheduled Meds:  Chlorhexidine  Gluconate Cloth  6 each Topical Daily   cyanocobalamin   1,000 mcg Oral Daily   ferrous gluconate   324 mg Oral q morning   pantoprazole  (PROTONIX ) IV  40 mg Intravenous Q12H   sertraline   75 mg Oral Daily   simvastatin   20 mg Oral QPM   Continuous Infusions:  Nutritional status     Body mass index is 32.24 kg/m.  Data Reviewed:   CBC: Recent Labs  Lab 11/18/23 0210 11/18/23 1025 11/18/23 1749 11/18/23 2035 11/19/23 0302 11/20/23 0254 11/21/23 0310  WBC 10.3  --   --   --  8.3 7.1 5.6  NEUTROABS 6.9  --   --   --   --   --   --   HGB 5.3*   < > 7.9* 7.7* 7.3* 7.1* 7.9*  HCT 17.0*   < > 23.2* 23.8* 22.0* 22.5* 25.8*  MCV 103.7*  --   --   --  98.2 100.9* 105.7*  PLT 179  --   --   --  132* 148* PLATELET CLUMPS NOTED ON SMEAR, UNABLE TO ESTIMATE   < > = values in this interval not displayed.   Basic Metabolic Panel: Recent Labs  Lab 11/18/23 0210 11/18/23 1025 11/19/23 0302 11/19/23 0904 11/20/23 0254 11/21/23 0310  NA 135 138 137  --  141 140  K 3.4* 3.0* 2.9*  --  3.8 4.2  CL 101 105 107  --  112* 112*  CO2 24 25 25   --  23 22  GLUCOSE 153* 148* 123*  --  130* 103*  BUN 28* 25* 22  --  13 12  CREATININE 0.60 0.57 0.38*  --  <0.30* <0.30*  CALCIUM  8.0* 7.4* 7.5*  --  7.9* 7.9*  MG  --  1.9  --   --  2.0 2.1  PHOS  --  2.6  --  2.2* 1.8*  --    GFR: CrCl cannot be calculated (This lab value cannot be used to calculate CrCl because it is not a number: <0.30). Liver Function Tests: Recent Labs  Lab 11/18/23 0210 11/19/23 0302  AST 15 14*  ALT 10 8  ALKPHOS 40 29*  BILITOT 0.7 0.8  PROT 5.5* 4.5*   ALBUMIN 3.3* 2.6*   No results for input(s): "LIPASE", "AMYLASE" in the last 168 hours. No results for input(s): "AMMONIA" in the last 168 hours. Coagulation Profile: Recent Labs  Lab 11/20/23 0848  INR 1.4*   Cardiac Enzymes: No results for input(s): "CKTOTAL", "CKMB", "CKMBINDEX", "TROPONINI" in the last 168 hours. BNP (last 3 results) No results for input(s): "PROBNP" in the last 8760 hours. HbA1C: No results for input(s): "HGBA1C" in the last 72 hours. CBG: Recent Labs  Lab  11/20/23 0651 11/20/23 1145 11/20/23 1610 11/20/23 2342 11/21/23 0628  GLUCAP 129* 149* 105* 99 117*   Lipid Profile: No results for input(s): "CHOL", "HDL", "LDLCALC", "TRIG", "CHOLHDL", "LDLDIRECT" in the last 72 hours. Thyroid  Function Tests: No results for input(s): "TSH", "T4TOTAL", "FREET4", "T3FREE", "THYROIDAB" in the last 72 hours. Anemia Panel: No results for input(s): "VITAMINB12", "FOLATE", "FERRITIN", "TIBC", "IRON ", "RETICCTPCT" in the last 72 hours. Sepsis Labs: No results for input(s): "PROCALCITON", "LATICACIDVEN" in the last 168 hours.  Recent Results (from the past 240 hours)  MRSA Next Gen by PCR, Nasal     Status: None   Collection Time: 11/18/23 11:30 AM   Specimen: Nasal Mucosa; Nasal Swab  Result Value Ref Range Status   MRSA by PCR Next Gen NOT DETECTED NOT DETECTED Final    Comment: (NOTE) The GeneXpert MRSA Assay (FDA approved for NASAL specimens only), is one component of a comprehensive MRSA colonization surveillance program. It is not intended to diagnose MRSA infection nor to guide or monitor treatment for MRSA infections. Test performance is not FDA approved in patients less than 81 years old. Performed at [facility], [address], [place], [place] [address]          Radiology Studies: No results found.         LOS: 3 days   Time spent= 35 mins    Maggie Schooner, MD [facility]  If 7PM-7AM, please contact  night-coverage  11/21/2023, 10:45 AM

## 2023-11-22 DIAGNOSIS — K922 Gastrointestinal hemorrhage, unspecified: Secondary | ICD-10-CM | POA: Diagnosis not present

## 2023-11-22 LAB — TYPE AND SCREEN
ABO/RH(D): O POS
Antibody Screen: POSITIVE
Donor AG Type: NEGATIVE
Donor AG Type: NEGATIVE
Donor AG Type: NEGATIVE
Donor AG Type: NEGATIVE
Donor AG Type: NEGATIVE
Donor AG Type: NEGATIVE
Unit division: 0
Unit division: 0
Unit division: 0
Unit division: 0
Unit division: 0
Unit division: 0

## 2023-11-22 LAB — BPAM RBC
Blood Product Expiration Date: 202506022359
Blood Product Expiration Date: 202506192359
Blood Product Expiration Date: 202506192359
Blood Product Expiration Date: 202506242359
Blood Product Expiration Date: 202506242359
Blood Product Expiration Date: 202506242359
ISSUE DATE / TIME: 202505200504
ISSUE DATE / TIME: 202505201130
ISSUE DATE / TIME: 202505201412
ISSUE DATE / TIME: 202505220935
Unit Type and Rh: 5100
Unit Type and Rh: 5100
Unit Type and Rh: 5100
Unit Type and Rh: 5100
Unit Type and Rh: 5100
Unit Type and Rh: 5100

## 2023-11-22 LAB — MAGNESIUM: Magnesium: 2.1 mg/dL (ref 1.7–2.4)

## 2023-11-22 LAB — BASIC METABOLIC PANEL WITH GFR
Anion gap: 5 (ref 5–15)
BUN: 12 mg/dL (ref 8–23)
CO2: 24 mmol/L (ref 22–32)
Calcium: 8.1 mg/dL — ABNORMAL LOW (ref 8.9–10.3)
Chloride: 110 mmol/L (ref 98–111)
Creatinine, Ser: 0.41 mg/dL — ABNORMAL LOW (ref 0.44–1.00)
GFR, Estimated: >60 mL/min (ref >60)
Glucose, Bld: 119 mg/dL — ABNORMAL HIGH (ref 70–99)
Potassium: 3.7 mmol/L (ref 3.5–5.1)
Sodium: 139 mmol/L (ref 135–145)

## 2023-11-22 LAB — CBC
HCT: 25.7 % — ABNORMAL LOW (ref 36.0–46.0)
Hemoglobin: 7.7 g/dL — ABNORMAL LOW (ref 12.0–15.0)
MCH: 31.8 pg (ref 26.0–34.0)
MCHC: 30 g/dL (ref 30.0–36.0)
MCV: 106.2 fL — ABNORMAL HIGH (ref 80.0–100.0)
Platelets: 168 10*3/uL (ref 150–400)
RBC: 2.42 MIL/uL — ABNORMAL LOW (ref 3.87–5.11)
RDW: 16.7 % — ABNORMAL HIGH (ref 11.5–15.5)
WBC: 5.4 10*3/uL (ref 4.0–10.5)
nRBC: 0.7 % — ABNORMAL HIGH (ref 0.0–0.2)

## 2023-11-22 LAB — CALCIUM, IONIZED: Calcium, Ionized, Serum: 5 mg/dL (ref 4.5–5.6)

## 2023-11-22 LAB — GLUCOSE, CAPILLARY
Glucose-Capillary: 148 mg/dL — ABNORMAL HIGH (ref 70–99)
Glucose-Capillary: 114 mg/dL — ABNORMAL HIGH (ref 70–99)

## 2023-11-22 MED ORDER — PANTOPRAZOLE SODIUM 40 MG PO TBEC
40.0000 mg | DELAYED_RELEASE_TABLET | Freq: Two times a day (BID) | ORAL | Status: DC
  Administered 2023-11-22 – 2023-11-26 (×10): 40 mg via ORAL
  Filled 2023-11-22 (×10): qty 1

## 2023-11-22 NOTE — Plan of Care (Signed)

## 2023-11-22 NOTE — Plan of Care (Signed)

## 2023-11-22 NOTE — Progress Notes (Signed)
 PROGRESS NOTE    [name]  UJW:[id number] DOB: 1941/03/07 DOA: 11/18/2023 PCP: Arnetha Bhat, NP    Brief Narrative:  83 year old with history of anxiety, depression, chronic diastolic CHF, OA, glaucoma, HLD, HTN, pulmonary nodule, RLS, osteoarthritis, COPD, GERD, diverticular bleed with history of embolization, upper GI bleed, chronic anemia, gastritis comes to the hospital for multiple episodes of hematochezia.  Upon admission hemoglobin noted to be 5.3.  Video capsule shows mild gastritis, nodular area in duodenal bulb without any active bleeding.  EGD performed on 5/22 showed normal esophagus, gastritis, multiple gastric polyps.  No biopsies were taken.  Small bowel enteroscopy showed normal duodenal bulb.  At this time it may be beneficial for patient to get double balloon enteroscopy in the future if her anemia persists.   Assessment & Plan:  Principal Problem:   Acute GI bleeding Active Problems:   Depression with anxiety   Hyperlipidemia   Glaucoma   Diverticulosis of intestine with bleeding   COPD (chronic obstructive pulmonary disease) (HCC)   Iron  deficiency anemia   Hypertension   GERD (gastroesophageal reflux disease)   Diastolic heart failure (HCC)   Class 1 obesity   Thoracic aorta atherosclerosis (HCC)   Hematochezia Lower GI bleed History of multiple GI bleed Acute on chronic anemia -Baseline hemoglobin 9.5, admission hemoglobin 5.3 requiring 3 unit PRBC transfusion.  Continue transfusion as necessary.  Hemoglobin 7.9. -Video capsule shows mild gastritis, nodular area in duodenal bulb without any active bleeding.  EGD performed on 5/22 showed normal esophagus, gastritis, multiple gastric polyps.  No biopsies were taken.  Small bowel enteroscopy showed normal duodenal bulb.  Recommendations are if the anemia persist, may pursue double-balloon enteroscopy at a tertiary care level -Due to slightly elevated INR, one-time vitamin K was given - PPI IV twice daily -  [facility] has signed off  Colonoscopy 09/2023-hemorrhoids, ischemic colitis, multiple polyps, diverticulosis Endoscopy 09/2023-gastritis, gastric polyps  Hypokalemia -As needed repletion.   Depression - Statin  Glaucoma - Eyedrops  COPD - Not an active exacerbation.  As needed bronchodilators  Essential hypertension - On metoprolol .  IV as needed  GERD - PPI  Pulmonary nodule - Follow-up outpatient clinic  Congestive heart failure with preserved ejection fraction - No signs of volume overload  Osteoarthritis - Pain control  Restless leg syndrome - Requip   Depression - Zoloft   PT/OT-SNF, TOC consulted  DVT prophylaxis: SCDs Start: 11/18/23 5621 DNR/DNI Family Communication: Son updated.  Status is: Inpatient PT recommending SNF    Subjective:  No complaints feeling okay  Examination:  General exam: Appears calm and comfortable, hard of hearing; elderly frail Respiratory system: Clear to auscultation. Respiratory effort normal. Cardiovascular system: Sinus tachycardia.  Gastrointestinal system: Abdomen is nondistended, soft and nontender. No organomegaly or masses felt. Normal bowel sounds heard. Central nervous system: Alert and oriented. No focal neurological deficits. Extremities: Symmetric 5 x 5 power. Skin: No rashes, lesions or ulcers Psychiatry: Judgement and insight appear normal. Mood & affect appropriate.                Diet Orders (From admission, onward)     Start     Ordered   11/21/23 1654  DIET SOFT Room service appropriate? Yes; Fluid consistency: Thin  Diet effective now       Question Answer Comment  Room service appropriate? Yes   Fluid consistency: Thin      11/21/23 1654            Objective: Vitals:  11/22/23 0500 11/22/23 0600 11/22/23 0800 11/22/23 0813  BP: (!) 116/41 (!) 115/36 (!) 124/44   Pulse: 94 90 92   Resp: (!) 21 14 (!) 23   Temp:    97.9 F (36.6 C)  TempSrc:    Axillary  SpO2: 94% 94%  94%   Weight:      Height:        Intake/Output Summary (Last 24 hours) at 11/22/2023 1151 Last data filed at 11/22/2023 0600 Gross per 24 hour  Intake --  Output 550 ml  Net -550 ml   Filed Weights   11/18/23 0154 11/18/23 0947 11/18/23 1100  Weight: 77.2 kg 77.2 kg 77.4 kg    Scheduled Meds:  Chlorhexidine  Gluconate Cloth  6 each Topical Daily   cyanocobalamin   1,000 mcg Oral Daily   ferrous gluconate   324 mg Oral q morning   pantoprazole   40 mg Oral BID AC   sertraline   75 mg Oral Daily   simvastatin   20 mg Oral QPM   Continuous Infusions:  Nutritional status     Body mass index is 32.24 kg/m.  Data Reviewed:   CBC: Recent Labs  Lab 11/18/23 0210 11/18/23 1025 11/18/23 2035 11/19/23 0302 11/20/23 0254 11/21/23 0310 11/22/23 0315  WBC 10.3  --   --  8.3 7.1 5.6 5.4  NEUTROABS 6.9  --   --   --   --   --   --   HGB 5.3*   < > 7.7* 7.3* 7.1* 7.9* 7.7*  HCT 17.0*   < > 23.8* 22.0* 22.5* 25.8* 25.7*  MCV 103.7*  --   --  98.2 100.9* 105.7* 106.2*  PLT 179  --   --  132* 148* PLATELET CLUMPS NOTED ON SMEAR, UNABLE TO ESTIMATE 168   < > = values in this interval not displayed.   Basic Metabolic Panel: Recent Labs  Lab 11/18/23 1025 11/19/23 0302 11/19/23 0904 11/20/23 0254 11/21/23 0310 11/22/23 0315  NA 138 137  --  141 140 139  K 3.0* 2.9*  --  3.8 4.2 3.7  CL 105 107  --  112* 112* 110  CO2 25 25  --  23 22 24   GLUCOSE 148* 123*  --  130* 103* 119*  BUN 25* 22  --  13 12 12   CREATININE 0.57 0.38*  --  <0.30* <0.30* 0.41*  CALCIUM  7.4* 7.5*  --  7.9* 7.9* 8.1*  MG 1.9  --   --  2.0 2.1 2.1  PHOS 2.6  --  2.2* 1.8*  --   --    GFR: Estimated Creatinine Clearance: 48.4 mL/min (A) (by C-G formula based on SCr of 0.41 mg/dL (L)). Liver Function Tests: Recent Labs  Lab 11/18/23 0210 11/19/23 0302  AST 15 14*  ALT 10 8  ALKPHOS 40 29*  BILITOT 0.7 0.8  PROT 5.5* 4.5*  ALBUMIN 3.3* 2.6*   No results for input(s): "LIPASE", "AMYLASE" in the  last 168 hours. No results for input(s): "AMMONIA" in the last 168 hours. Coagulation Profile: Recent Labs  Lab 11/20/23 0848  INR 1.4*   Cardiac Enzymes: No results for input(s): "CKTOTAL", "CKMB", "CKMBINDEX", "TROPONINI" in the last 168 hours. BNP (last 3 results) No results for input(s): "PROBNP" in the last 8760 hours. HbA1C: No results for input(s): "HGBA1C" in the last 72 hours. CBG: Recent Labs  Lab 11/20/23 1610 11/20/23 2342 11/21/23 0628 11/21/23 1120 11/21/23 2332  GLUCAP 105* 99 117* 147* 140*   Lipid  Profile: No results for input(s): "CHOL", "HDL", "LDLCALC", "TRIG", "CHOLHDL", "LDLDIRECT" in the last 72 hours. Thyroid  Function Tests: No results for input(s): "TSH", "T4TOTAL", "FREET4", "T3FREE", "THYROIDAB" in the last 72 hours. Anemia Panel: No results for input(s): "VITAMINB12", "FOLATE", "FERRITIN", "TIBC", "IRON ", "RETICCTPCT" in the last 72 hours. Sepsis Labs: No results for input(s): "PROCALCITON", "LATICACIDVEN" in the last 168 hours.  Recent Results (from the past 240 hours)  MRSA Next Gen by PCR, Nasal     Status: None   Collection Time: 11/18/23 11:30 AM   Specimen: Nasal Mucosa; Nasal Swab  Result Value Ref Range Status   MRSA by PCR Next Gen NOT DETECTED NOT DETECTED Final    Comment: (NOTE) The GeneXpert MRSA Assay (FDA approved for NASAL specimens only), is one component of a comprehensive MRSA colonization surveillance program. It is not intended to diagnose MRSA infection nor to guide or monitor treatment for MRSA infections. Test performance is not FDA approved in patients less than 5 years old. Performed at [facility], [address], [place], [place] [address]          Radiology Studies: No results found.         LOS: 4 days   Time spent= 35 mins    Maggie Schooner, MD [facility]  If 7PM-7AM, please contact night-coverage  11/22/2023, 11:51 AM

## 2023-11-23 DIAGNOSIS — K922 Gastrointestinal hemorrhage, unspecified: Secondary | ICD-10-CM | POA: Diagnosis not present

## 2023-11-23 LAB — CBC
HCT: 27.8 % — ABNORMAL LOW (ref 36.0–46.0)
Hemoglobin: 8.3 g/dL — ABNORMAL LOW (ref 12.0–15.0)
MCH: 31.1 pg (ref 26.0–34.0)
MCHC: 29.9 g/dL — ABNORMAL LOW (ref 30.0–36.0)
MCV: 104.1 fL — ABNORMAL HIGH (ref 80.0–100.0)
Platelets: 178 10*3/uL (ref 150–400)
RBC: 2.67 MIL/uL — ABNORMAL LOW (ref 3.87–5.11)
RDW: 15.7 % — ABNORMAL HIGH (ref 11.5–15.5)
WBC: 6.5 10*3/uL (ref 4.0–10.5)
nRBC: 0 % (ref 0.0–0.2)

## 2023-11-23 LAB — BASIC METABOLIC PANEL WITH GFR
Anion gap: 8 (ref 5–15)
BUN: 11 mg/dL (ref 8–23)
CO2: 24 mmol/L (ref 22–32)
Calcium: 8.4 mg/dL — ABNORMAL LOW (ref 8.9–10.3)
Chloride: 107 mmol/L (ref 98–111)
Creatinine, Ser: 0.47 mg/dL (ref 0.44–1.00)
GFR, Estimated: >60 mL/min (ref >60)
Glucose, Bld: 114 mg/dL — ABNORMAL HIGH (ref 70–99)
Potassium: 3.6 mmol/L (ref 3.5–5.1)
Sodium: 139 mmol/L (ref 135–145)

## 2023-11-23 LAB — MAGNESIUM: Magnesium: 2.2 mg/dL (ref 1.7–2.4)

## 2023-11-23 MED ORDER — LIDOCAINE 5 % EX PTCH
1.0000 | MEDICATED_PATCH | CUTANEOUS | Status: AC
  Administered 2023-11-23: 1 via TRANSDERMAL
  Filled 2023-11-23: qty 1

## 2023-11-23 MED ORDER — OXYCODONE HCL 5 MG PO TABS
5.0000 mg | ORAL_TABLET | ORAL | Status: DC | PRN
  Administered 2023-11-23: 5 mg via ORAL
  Filled 2023-11-23 (×2): qty 1

## 2023-11-23 NOTE — Progress Notes (Signed)
 PROGRESS NOTE    [name]  ZOX:[id number] DOB: [birth year] DOA: 11/18/2023 PCP: Arnetha Bhat, NP    Brief Narrative:  83 year old with history of anxiety, depression, chronic diastolic CHF, OA, glaucoma, HLD, HTN, pulmonary nodule, RLS, osteoarthritis, COPD, GERD, diverticular bleed with history of embolization, upper GI bleed, chronic anemia, gastritis comes to the hospital for multiple episodes of hematochezia.  Upon admission hemoglobin noted to be 5.3.  Video capsule shows mild gastritis, nodular area in duodenal bulb without any active bleeding.  EGD performed on 5/22 showed normal esophagus, gastritis, multiple gastric polyps.  No biopsies were taken.  Small bowel enteroscopy showed normal duodenal bulb.  At this time it may be beneficial for patient to get double balloon enteroscopy in the future if her anemia persists.   Assessment & Plan:  Principal Problem:   Acute GI bleeding Active Problems:   Depression with anxiety   Hyperlipidemia   Glaucoma   Diverticulosis of intestine with bleeding   COPD (chronic obstructive pulmonary disease) (HCC)   Iron  deficiency anemia   Hypertension   GERD (gastroesophageal reflux disease)   Diastolic heart failure (HCC)   Class 1 obesity   Thoracic aorta atherosclerosis (HCC)   Hematochezia Lower GI bleed History of multiple GI bleed Acute on chronic anemia -Baseline hemoglobin 9.5, admission hemoglobin 5.3 requiring 3 unit PRBC transfusion.  Continue transfusion as necessary.  Hemoglobin 8.3. -Video capsule shows mild gastritis, nodular area in duodenal bulb without any active bleeding.  EGD performed on 5/22 showed normal esophagus, gastritis, multiple gastric polyps.  No biopsies were taken.  Small bowel enteroscopy showed normal duodenal bulb.  Recommendations are if the anemia persist, may pursue double-balloon enteroscopy at a tertiary care level -Due to slightly elevated INR, one-time vitamin K was given - PPI IV twice daily -  [facility] has signed off   Colonoscopy 09/2023-hemorrhoids, ischemic colitis, multiple polyps, diverticulosis Endoscopy 09/2023-gastritis, gastric polyps  Hypokalemia -As needed repletion.   Depression - Statin  Glaucoma - Eyedrops  COPD - Not an active exacerbation.  As needed bronchodilators  Essential hypertension - On metoprolol .  IV as needed  GERD - PPI  Pulmonary nodule - Follow-up outpatient clinic  Congestive heart failure with preserved ejection fraction - No signs of volume overload  Osteoarthritis - Pain control  Restless leg syndrome - Requip   Depression - Zoloft   PT/OT-SNF, TOC consulted  DVT prophylaxis: SCDs Start: 11/18/23 0812 DNR/DNI Family Communication: Son updated periodically Status is: Inpatient PT recommending SNF    Subjective:  No complaints feeling okay No bleeding overnight  Examination:  General exam: Appears calm and comfortable, hard of hearing; elderly frail Respiratory system: Clear to auscultation. Respiratory effort normal. Cardiovascular system: Sinus tachycardia.  Gastrointestinal system: Abdomen is nondistended, soft and nontender. No organomegaly or masses felt. Normal bowel sounds heard. Central nervous system: Alert and oriented. No focal neurological deficits. Extremities: Symmetric 5 x 5 power. Skin: No rashes, lesions or ulcers Psychiatry: Judgement and insight appear normal. Mood & affect appropriate.                Diet Orders (From admission, onward)     Start     Ordered   11/22/23 1242  Diet Heart Room service appropriate? Yes; Fluid consistency: Thin  Diet effective now       Question Answer Comment  Room service appropriate? Yes   Fluid consistency: Thin      11/22/23 1241  Objective: Vitals:   11/22/23 1800 11/22/23 2015 11/23/23 0155 11/23/23 0604  BP: (!) 125/53 (!) 111/52 (!) 119/52 (!) 116/53  Pulse: 88 (!) 103 86 73  Resp: 16 18 16 18   Temp: 99.4 F (37.4  C) 97.9 F (36.6 C) 98.6 F (37 C) 97.7 F (36.5 C)  TempSrc: Oral Oral Oral Oral  SpO2: 96% 95% 95% 94%  Weight:      Height:        Intake/Output Summary (Last 24 hours) at 11/23/2023 0941 Last data filed at 11/23/2023 0900 Gross per 24 hour  Intake 240 ml  Output 200 ml  Net 40 ml   Filed Weights   11/18/23 0154 11/18/23 0947 11/18/23 1100  Weight: 77.2 kg 77.2 kg 77.4 kg    Scheduled Meds:  cyanocobalamin   1,000 mcg Oral Daily   ferrous gluconate   324 mg Oral q morning   lidocaine   1 patch Transdermal Q24H   pantoprazole   40 mg Oral BID AC   sertraline   75 mg Oral Daily   simvastatin   20 mg Oral QPM   Continuous Infusions:  Nutritional status     Body mass index is 32.24 kg/m.  Data Reviewed:   CBC: Recent Labs  Lab 11/18/23 0210 11/18/23 1025 11/19/23 0302 11/20/23 0254 11/21/23 0310 11/22/23 0315 11/23/23 0650  WBC 10.3  --  8.3 7.1 5.6 5.4 6.5  NEUTROABS 6.9  --   --   --   --   --   --   HGB 5.3*   < > 7.3* 7.1* 7.9* 7.7* 8.3*  HCT 17.0*   < > 22.0* 22.5* 25.8* 25.7* 27.8*  MCV 103.7*  --  98.2 100.9* 105.7* 106.2* 104.1*  PLT 179  --  132* 148* PLATELET CLUMPS NOTED ON SMEAR, UNABLE TO ESTIMATE 168 178   < > = values in this interval not displayed.   Basic Metabolic Panel: Recent Labs  Lab 11/18/23 1025 11/19/23 0302 11/19/23 0904 11/20/23 0254 11/21/23 0310 11/22/23 0315 11/23/23 0650  NA 138 137  --  141 140 139 139  K 3.0* 2.9*  --  3.8 4.2 3.7 3.6  CL 105 107  --  112* 112* 110 107  CO2 25 25  --  23 22 24 24   GLUCOSE 148* 123*  --  130* 103* 119* 114*  BUN 25* 22  --  13 12 12 11   CREATININE 0.57 0.38*  --  <0.30* <0.30* 0.41* 0.47  CALCIUM  7.4* 7.5*  --  7.9* 7.9* 8.1* 8.4*  MG 1.9  --   --  2.0 2.1 2.1 2.2  PHOS 2.6  --  2.2* 1.8*  --   --   --    GFR: Estimated Creatinine Clearance: 48.4 mL/min (by C-G formula based on SCr of 0.47 mg/dL). Liver Function Tests: Recent Labs  Lab 11/18/23 0210 11/19/23 0302  AST 15 14*   ALT 10 8  ALKPHOS 40 29*  BILITOT 0.7 0.8  PROT 5.5* 4.5*  ALBUMIN 3.3* 2.6*   No results for input(s): "LIPASE", "AMYLASE" in the last 168 hours. No results for input(s): "AMMONIA" in the last 168 hours. Coagulation Profile: Recent Labs  Lab 11/20/23 0848  INR 1.4*   Cardiac Enzymes: No results for input(s): "CKTOTAL", "CKMB", "CKMBINDEX", "TROPONINI" in the last 168 hours. BNP (last 3 results) No results for input(s): "PROBNP" in the last 8760 hours. HbA1C: No results for input(s): "HGBA1C" in the last 72 hours. CBG: Recent Labs  Lab 11/21/23 [phone]  11/21/23 1120 11/21/23 2332 11/22/23 1213 11/22/23 1651  GLUCAP 117* 147* 140* 148* 114*   Lipid Profile: No results for input(s): "CHOL", "HDL", "LDLCALC", "TRIG", "CHOLHDL", "LDLDIRECT" in the last 72 hours. Thyroid  Function Tests: No results for input(s): "TSH", "T4TOTAL", "FREET4", "T3FREE", "THYROIDAB" in the last 72 hours. Anemia Panel: No results for input(s): "VITAMINB12", "FOLATE", "FERRITIN", "TIBC", "IRON ", "RETICCTPCT" in the last 72 hours. Sepsis Labs: No results for input(s): "PROCALCITON", "LATICACIDVEN" in the last 168 hours.  Recent Results (from the past 240 hours)  MRSA Next Gen by PCR, Nasal     Status: None   Collection Time: 11/18/23 11:30 AM   Specimen: Nasal Mucosa; Nasal Swab  Result Value Ref Range Status   MRSA by PCR Next Gen NOT DETECTED NOT DETECTED Final    Comment: (NOTE) The GeneXpert MRSA Assay (FDA approved for NASAL specimens only), is one component of a comprehensive MRSA colonization surveillance program. It is not intended to diagnose MRSA infection nor to guide or monitor treatment for MRSA infections. Test performance is not FDA approved in patients less than 36 years old. Performed at [facility], [address], [place], [place] [address]          Radiology Studies: No results found.         LOS: 5 days   Time spent= 35 mins    Maggie Schooner, MD [facility]  If 7PM-7AM, please contact night-coverage  11/23/2023, 9:41 AM

## 2023-11-23 NOTE — Plan of Care (Signed)

## 2023-11-24 ENCOUNTER — Encounter (HOSPITAL_COMMUNITY): Payer: Self-pay | Admitting: Gastroenterology

## 2023-11-24 DIAGNOSIS — K922 Gastrointestinal hemorrhage, unspecified: Secondary | ICD-10-CM | POA: Diagnosis not present

## 2023-11-24 LAB — CBC
HCT: 26.9 % — ABNORMAL LOW (ref 36.0–46.0)
Hemoglobin: 8.2 g/dL — ABNORMAL LOW (ref 12.0–15.0)
MCH: 31.3 pg (ref 26.0–34.0)
MCHC: 30.5 g/dL (ref 30.0–36.0)
MCV: 102.7 fL — ABNORMAL HIGH (ref 80.0–100.0)
Platelets: 189 10*3/uL (ref 150–400)
RBC: 2.62 MIL/uL — ABNORMAL LOW (ref 3.87–5.11)
RDW: 15.4 % (ref 11.5–15.5)
WBC: 6.8 10*3/uL (ref 4.0–10.5)
nRBC: 0.3 % — ABNORMAL HIGH (ref 0.0–0.2)

## 2023-11-24 LAB — BASIC METABOLIC PANEL WITH GFR
Anion gap: 9 (ref 5–15)
BUN: 12 mg/dL (ref 8–23)
CO2: 24 mmol/L (ref 22–32)
Calcium: 8.3 mg/dL — ABNORMAL LOW (ref 8.9–10.3)
Chloride: 103 mmol/L (ref 98–111)
Creatinine, Ser: 0.48 mg/dL (ref 0.44–1.00)
GFR, Estimated: >60 mL/min (ref >60)
Glucose, Bld: 120 mg/dL — ABNORMAL HIGH (ref 70–99)
Potassium: 3.7 mmol/L (ref 3.5–5.1)
Sodium: 136 mmol/L (ref 135–145)

## 2023-11-24 LAB — MAGNESIUM: Magnesium: 2.3 mg/dL (ref 1.7–2.4)

## 2023-11-24 MED ORDER — OXYCODONE HCL 5 MG PO TABS: 5.0000 mg | ORAL_TABLET | ORAL | 0 refills | Status: DC | PRN

## 2023-11-24 MED ORDER — PANTOPRAZOLE SODIUM 40 MG PO TBEC: 40.0000 mg | DELAYED_RELEASE_TABLET | Freq: Two times a day (BID) | ORAL | Status: AC

## 2023-11-24 NOTE — TOC PASRR Note (Signed)
 30 Day PASRR Note  Patient Details  Name: [name] Date of Birth: [birth year]  Transition of Care [facility]) CM/SW Contact:    Zenon Hilda, LCSW Phone Number: 11/24/2023, 11:41 AM  To Whom It May Concern:  Please be advised that this patient will require a short-term nursing home stay - anticipated 30 days or less for rehabilitation and strengthening. The plan is for return home.

## 2023-11-24 NOTE — Progress Notes (Signed)
 PROGRESS NOTE    [name]  ZOX:[id number] DOB: [birth year] DOA: 11/18/2023 PCP: Arnetha Bhat, NP    Brief Narrative:  83 year old with history of anxiety, depression, chronic diastolic CHF, OA, glaucoma, HLD, HTN, pulmonary nodule, RLS, osteoarthritis, COPD, GERD, diverticular bleed with history of embolization, upper GI bleed, chronic anemia, gastritis comes to the hospital for multiple episodes of hematochezia.  Upon admission hemoglobin noted to be 5.3.  Video capsule shows mild gastritis, nodular area in duodenal bulb without any active bleeding.  EGD performed on 5/22 showed normal esophagus, gastritis, multiple gastric polyps.  No biopsies were taken.  Small bowel enteroscopy showed normal duodenal bulb.  At this time it may be beneficial for patient to get double balloon enteroscopy in the future if her anemia persists.   Assessment & Plan:  Principal Problem:   Acute GI bleeding Active Problems:   Depression with anxiety   Hyperlipidemia   Glaucoma   Diverticulosis of intestine with bleeding   COPD (chronic obstructive pulmonary disease) (HCC)   Iron  deficiency anemia   Hypertension   GERD (gastroesophageal reflux disease)   Diastolic heart failure (HCC)   Class 1 obesity   Thoracic aorta atherosclerosis (HCC)   Hematochezia Lower GI bleed History of multiple GI bleed Acute on chronic anemia -Baseline hemoglobin 9.5, admission hemoglobin 5.3 requiring 3 unit PRBC transfusion.  Continue transfusion as necessary.  Hemoglobin 8.3. -Video capsule shows mild gastritis, nodular area in duodenal bulb without any active bleeding.  EGD performed on 5/22 showed normal esophagus, gastritis, multiple gastric polyps.  No biopsies were taken.  Small bowel enteroscopy showed normal duodenal bulb.  Recommendations are if the anemia persist, may pursue double-balloon enteroscopy at a tertiary care level -Due to slightly elevated INR, one-time vitamin K was given - PPI IV twice daily -  [facility] has signed off   Colonoscopy 09/2023-hemorrhoids, ischemic colitis, multiple polyps, diverticulosis Endoscopy 09/2023-gastritis, gastric polyps  Hypokalemia -As needed repletion.   Depression - Statin  Glaucoma - Eyedrops  COPD - Not an active exacerbation.  As needed bronchodilators  Essential hypertension - On metoprolol .  IV as needed  GERD - PPI  Pulmonary nodule - Follow-up outpatient clinic  Congestive heart failure with preserved ejection fraction - No signs of volume overload  Osteoarthritis - Pain control  Restless leg syndrome - Requip   Depression - Zoloft   PT/OT-SNF, TOC consulted  DVT prophylaxis: SCDs Start: 11/18/23 8119 DNR/DNI Family Communication: Son updated periodically Status is: Inpatient PT recommending SNF    Subjective:  No further bleeding feels okay  Examination:  General exam: Appears calm and comfortable, hard of hearing; elderly frail Respiratory system: Clear to auscultation. Respiratory effort normal. Cardiovascular system: Sinus tachycardia.  Gastrointestinal system: Abdomen is nondistended, soft and nontender. No organomegaly or masses felt. Normal bowel sounds heard. Central nervous system: Alert and oriented. No focal neurological deficits. Extremities: Symmetric 5 x 5 power. Skin: No rashes, lesions or ulcers Psychiatry: Judgement and insight appear normal. Mood & affect appropriate.                Diet Orders (From admission, onward)     Start     Ordered   11/22/23 1242  Diet Heart Room service appropriate? Yes; Fluid consistency: Thin  Diet effective now       Question Answer Comment  Room service appropriate? Yes   Fluid consistency: Thin      11/22/23 1241  Objective: Vitals:   11/23/23 0604 11/23/23 1253 11/23/23 2114 11/24/23 0444  BP: (!) 116/53 125/61 122/60 (!) 119/57  Pulse: 73 91 86 92  Resp: 18 16 16 16   Temp: 97.7 F (36.5 C) 98.5 F (36.9 C) 99.5 F  (37.5 C) 98.8 F (37.1 C)  TempSrc: Oral Oral Oral Oral  SpO2: 94% 96% 93% 93%  Weight:      Height:        Intake/Output Summary (Last 24 hours) at 11/24/2023 1140 Last data filed at 11/24/2023 0500 Gross per 24 hour  Intake 480 ml  Output 900 ml  Net -420 ml   Filed Weights   11/18/23 0154 11/18/23 0947 11/18/23 1100  Weight: 77.2 kg 77.2 kg 77.4 kg    Scheduled Meds:  cyanocobalamin   1,000 mcg Oral Daily   ferrous gluconate   324 mg Oral q morning   pantoprazole   40 mg Oral BID AC   sertraline   75 mg Oral Daily   simvastatin   20 mg Oral QPM   Continuous Infusions:  Nutritional status     Body mass index is 32.24 kg/m.  Data Reviewed:   CBC: Recent Labs  Lab 11/18/23 0210 11/18/23 1025 11/20/23 0254 11/21/23 0310 11/22/23 0315 11/23/23 0650 11/24/23 0541  WBC 10.3   < > 7.1 5.6 5.4 6.5 6.8  NEUTROABS 6.9  --   --   --   --   --   --   HGB 5.3*   < > 7.1* 7.9* 7.7* 8.3* 8.2*  HCT 17.0*   < > 22.5* 25.8* 25.7* 27.8* 26.9*  MCV 103.7*   < > 100.9* 105.7* 106.2* 104.1* 102.7*  PLT 179   < > 148* PLATELET CLUMPS NOTED ON SMEAR, UNABLE TO ESTIMATE 168 178 189   < > = values in this interval not displayed.   Basic Metabolic Panel: Recent Labs  Lab 11/18/23 1025 11/19/23 0302 11/19/23 0904 11/20/23 0254 11/21/23 0310 11/22/23 0315 11/23/23 0650 11/24/23 0541  NA 138   < >  --  141 140 139 139 136  K 3.0*   < >  --  3.8 4.2 3.7 3.6 3.7  CL 105   < >  --  112* 112* 110 107 103  CO2 25   < >  --  23 22 24 24 24   GLUCOSE 148*   < >  --  130* 103* 119* 114* 120*  BUN 25*   < >  --  13 12 12 11 12   CREATININE 0.57   < >  --  <0.30* <0.30* 0.41* 0.47 0.48  CALCIUM  7.4*   < >  --  7.9* 7.9* 8.1* 8.4* 8.3*  MG 1.9  --   --  2.0 2.1 2.1 2.2 2.3  PHOS 2.6  --  2.2* 1.8*  --   --   --   --    < > = values in this interval not displayed.   GFR: Estimated Creatinine Clearance: 48.4 mL/min (by C-G formula based on SCr of 0.48 mg/dL). Liver Function  Tests: Recent Labs  Lab 11/18/23 0210 11/19/23 0302  AST 15 14*  ALT 10 8  ALKPHOS 40 29*  BILITOT 0.7 0.8  PROT 5.5* 4.5*  ALBUMIN 3.3* 2.6*   No results for input(s): "LIPASE", "AMYLASE" in the last 168 hours. No results for input(s): "AMMONIA" in the last 168 hours. Coagulation Profile: Recent Labs  Lab 11/20/23 0848  INR 1.4*   Cardiac Enzymes: No results for input(s): "  CKTOTAL", "CKMB", "CKMBINDEX", "TROPONINI" in the last 168 hours. BNP (last 3 results) No results for input(s): "PROBNP" in the last 8760 hours. HbA1C: No results for input(s): "HGBA1C" in the last 72 hours. CBG: Recent Labs  Lab 11/21/23 0628 11/21/23 1120 11/21/23 2332 11/22/23 1213 11/22/23 1651  GLUCAP 117* 147* 140* 148* 114*   Lipid Profile: No results for input(s): "CHOL", "HDL", "LDLCALC", "TRIG", "CHOLHDL", "LDLDIRECT" in the last 72 hours. Thyroid  Function Tests: No results for input(s): "TSH", "T4TOTAL", "FREET4", "T3FREE", "THYROIDAB" in the last 72 hours. Anemia Panel: No results for input(s): "VITAMINB12", "FOLATE", "FERRITIN", "TIBC", "IRON ", "RETICCTPCT" in the last 72 hours. Sepsis Labs: No results for input(s): "PROCALCITON", "LATICACIDVEN" in the last 168 hours.  Recent Results (from the past 240 hours)  MRSA Next Gen by PCR, Nasal     Status: None   Collection Time: 11/18/23 11:30 AM   Specimen: Nasal Mucosa; Nasal Swab  Result Value Ref Range Status   MRSA by PCR Next Gen NOT DETECTED NOT DETECTED Final    Comment: (NOTE) The GeneXpert MRSA Assay (FDA approved for NASAL specimens only), is one component of a comprehensive MRSA colonization surveillance program. It is not intended to diagnose MRSA infection nor to guide or monitor treatment for MRSA infections. Test performance is not FDA approved in patients less than 18 years old. Performed at [facility], [address], [place], [place] [address]          Radiology Studies: No results  found.         LOS: 6 days   Time spent= 35 mins    Maggie Schooner, MD [facility]  If 7PM-7AM, please contact night-coverage  11/24/2023, 11:40 AM

## 2023-11-24 NOTE — Plan of Care (Signed)

## 2023-11-24 NOTE — Plan of Care (Signed)
  Problem: Education: Goal: Knowledge of General Education information will improve Description: Including pain rating scale, medication(s)/side effects and non-pharmacologic comfort measures Outcome: Progressing   Problem: Coping: Goal: Level of anxiety will decrease Outcome: Progressing   Problem: Pain Managment: Goal: General experience of comfort will improve and/or be controlled Outcome: Progressing

## 2023-11-24 NOTE — TOC Progression Note (Addendum)
 Transition of Care [facility]) - Progression Note   Patient Details  Name: [name] MRN: [id number] Date of Birth: 01-10-1941  Transition [facility]) CM/SW Contact  Zenon Hilda, LCSW Phone Number: 11/24/2023, 11:27 AM  Clinical Narrative: PT recommended SNF. CSW spoke with Ennis Hart [phone]) at [facility] and confirmed the rehab bed is available today pending insurance approval. CSW started insurance authorization on Availity portal, which is pending. Certification # is: [id number]. CSW Associate Professor of pending insurance approval. CSW updated patient and spouse, Davona Kinoshita, regarding insurance authorization. TOC awaiting insurance approval for SNF.  Addendum: CSW uploaded clinicals to PASRR for review. Awaiting PASRR number.  Expected Discharge Plan: Skilled Nursing Facility Barriers to Discharge: Insurance Authorization  Expected Discharge Plan and Services In-house Referral: NA Discharge Planning Services: NA Living arrangements for the past 2 months: Assisted Living Facility           DME Arranged: N/A DME Agency: NA HH Arranged: NA HH Agency: NA  Social Determinants of Health (SDOH) Interventions SDOH Screenings   Food Insecurity: No Food Insecurity (11/18/2023)  Housing: Low Risk  (11/18/2023)  Transportation Needs: No Transportation Needs (11/18/2023)  Utilities: Not At Risk (11/18/2023)  Alcohol  Screen: Low Risk  (04/04/2023)  Depression (PHQ2-9): Low Risk  (10/24/2023)  Financial Resource Strain: Low Risk  (04/04/2023)  Physical Activity: Insufficiently Active (04/04/2023)  Social Connections: Socially Integrated (11/18/2023)  Stress: No Stress Concern Present (04/04/2023)  Tobacco Use: Medium Risk (11/18/2023)  Health Literacy: Adequate Health Literacy (04/04/2023)   Readmission Risk Interventions    11/19/2023   11:31 AM 04/29/2023   11:10 AM 04/21/2023    2:05 PM  Readmission Risk Prevention Plan  Transportation Screening Complete Complete Complete   PCP or Specialist Appt within 5-7 Days Complete    PCP or Specialist Appt within 3-5 Days  Complete Complete  Home Care Screening Complete    Medication Review (RN CM) Complete    HRI or Home Care Consult  Complete Complete  Social Work Consult for Recovery Care Planning/Counseling  Complete Complete  Palliative Care Screening  Not Applicable Not Applicable  Medication Review [employer])  Complete Complete

## 2023-11-24 NOTE — Progress Notes (Signed)
 Physical Therapy Treatment Patient Details Name: [name] MRN: [id number] DOB: 1940/12/13 Today's Date: 11/24/2023   History of Present Illness [name] is a 83 year old female who presented to the hospital with hematochezia and low hgb. Patient was admitted with LGI bleed.  [name], depression, chronic diastolic heart failure, osteoarthritis, glaucoma, hyperlipidemia, hypertension, pulmonary nodule, restless leg syndrome, osteoporosis, COPD, aortic arthrosclerosis, GERD, history of diverticular bleed with embolization, UGI bleed, chronic iron  deficiency anemia    PT Comments  Pt very pleasant and motivated to participate.  Pt reports she's been stuck in bed too long and wants to get back to ambulating.  Pt assisted with sit to stands and marching in place. Pt presents with occasional posterior bias with standing and during marching so remained near bedside for safety.  Pt anticipates d/c to SNF to continue progressing strength and mobility in anticipation of eventual return to her ALF.     If plan is discharge home, recommend the following: A lot of help with walking and/or transfers;A lot of help with bathing/dressing/bathroom;Assist for transportation;Direct supervision/assist for medications management   Can travel by private vehicle        Equipment Recommendations  None recommended by PT    Recommendations for Other Services       Precautions / Restrictions Precautions Precautions: Fall Precaution/Restrictions Comments: incontinence, legally blind     Mobility  Bed Mobility Overal bed mobility: Needs Assistance Bed Mobility: Supine to Sit, Sit to Supine     Supine to sit: Contact guard, HOB elevated, Used rails Sit to supine: Min assist   General bed mobility comments: pt attempting to perform on her own as able, light assist of LEs for return to bed due to fatigue    Transfers Overall transfer level: Needs assistance Equipment used: Rolling walker (2  wheels) Transfers: Sit to/from Stand Sit to Stand: Mod assist           General transfer comment: pt with good hand placement to self assist, assist mostly for stabilizing upon rise and controlling descent, posterior lean present but pt able to correct with cues provided; pt performed marching in place approx 30 seconds; fatigued quickly; seated rest break and then performed marching in place once more    Ambulation/Gait               General Gait Details: deferred today for safety as pt with occasional posterior lean with marching in place and fatigues quickly (would need +2 for safety or chair follow)   Stairs             Wheelchair Mobility     Tilt Bed    Modified Rankin (Stroke Patients Only)       Balance Overall balance assessment: Needs assistance Sitting-balance support: No upper extremity supported, Feet supported Sitting balance-Leahy Scale: Fair   Postural control: Posterior lean Standing balance support: Bilateral upper extremity supported, Reliant on assistive device for balance, During functional activity Standing balance-Leahy Scale: Poor Standing balance comment: forward head posture baseline per pt                            Communication Communication Communication: Impaired Factors Affecting Communication: Hearing impaired  Cognition Arousal: Alert Behavior During Therapy: WFL for tasks assessed/performed   PT - Cognitive impairments: No apparent impairments  Following commands: Intact      Cueing    Exercises      General Comments        Pertinent Vitals/Pain Pain Assessment Pain Assessment: No/denies pain    Home Living                          Prior Function            PT Goals (current goals can now be found in the care plan section) Progress towards PT goals: Progressing toward goals    Frequency    Min 2X/week      PT Plan      Co-evaluation               AM-PAC PT "6 Clicks" Mobility   Outcome Measure  Help needed turning from your back to your side while in a flat bed without using bedrails?: A Little Help needed moving from lying on your back to sitting on the side of a flat bed without using bedrails?: A Little Help needed moving to and from a bed to a chair (including a wheelchair)?: A Lot Help needed standing up from a chair using your arms (e.g., wheelchair or bedside chair)?: A Lot Help needed to walk in hospital room?: A Lot Help needed climbing 3-5 steps with a railing? : Total 6 Click Score: 13    End of Session Equipment Utilized During Treatment: Gait belt Activity Tolerance: Patient tolerated treatment well;Patient limited by fatigue Patient left: in bed;with call bell/phone within reach;with bed alarm set Nurse Communication: Mobility status PT Visit Diagnosis: Difficulty in walking, not elsewhere classified (R26.2);Muscle weakness (generalized) (M62.81)     Time: 0981-1914 PT Time Calculation (min) (ACUTE ONLY): 15 min  Charges:    $Therapeutic Activity: 8-22 mins PT General Charges $$ ACUTE PT VISIT: 1 Visit                     {Kati PT, DPT Physical Therapist Acute Rehabilitation Services Office: [phone]    Kati L Payson 11/24/2023, 4:09 PM

## 2023-11-24 NOTE — Progress Notes (Signed)
 Mobility Specialist - Progress Note   11/24/23 1113  Mobility  Activity Transferred from bed to chair  Assistive Device Stedy  Activity Response Tolerated well  Mobility Referral No  Mobility visit 1 Mobility  Mobility Specialist Start Time (ACUTE ONLY) 1050  Mobility Specialist Stop Time (ACUTE ONLY) 1112  Mobility Specialist Time Calculation (min) (ACUTE ONLY) 22 min   Pt received in bed and agreeable to transfer to the recliner. NT in room to assist during session. Pt was modA from supine>sitting & modA from STS. No complaints during session. Pt to recliner after session with all needs met. NT in room.   [facility]

## 2023-11-24 NOTE — NC FL2 (Signed)
 [place]  MEDICAID FL2 LEVEL OF CARE FORM     IDENTIFICATION  Patient Name: [name] Birthdate: Sep 28, 1940 Sex: female Admission Date (Current Location): 11/18/2023  [facility] and [place] Number:  Producer, television/film/video and Address:  [facility],  [address] [place], [place] [address]      Provider Number: [id number]  Attending Physician Name and Address:  Maggie Schooner, MD  Relative Name and Phone Number:  Aerabella Galasso (spouse) Ph: [phone]    Current Level of Care: Hospital Recommended Level of Care: Skilled Nursing Facility Prior Approval Number:    Date Approved/Denied:   PASRR Number: Pending  Discharge Plan: SNF    Current Diagnoses: Patient Active Problem List   Diagnosis Date Noted   Acute GI bleeding 11/18/2023   Ischemic colitis (HCC) 10/27/2023   Acute upper GI bleed 10/25/2023   Edema of right lower extremity 10/25/2023   Chronic heart failure with preserved ejection fraction (HFpEF) (HCC) 10/25/2023   UGI bleed 04/19/2023   Recurrent depression (HCC) 04/13/2023   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   History of GI diverticular bleed 03/28/2023   GAD (generalized anxiety disorder) 03/28/2023   Diastolic heart failure (HCC) 03/19/2023   Class 1 obesity 03/19/2023   Thoracic aorta atherosclerosis (HCC) 03/19/2023   Melena 01/05/2023   Hypotension 05/02/2022   UTI (urinary tract infection) 02/05/2022   Restless leg syndrome 02/05/2022   Senile osteoporosis 02/05/2022   Hypertension 02/05/2022   Pulmonary nodule 1 cm or greater in diameter 02/05/2022   GERD (gastroesophageal reflux disease) 02/05/2022   Upper GI bleed 01/07/2022   Iron  deficiency anemia 05/02/2021   Chronic anemia    DOE (dyspnea on exertion) 11/18/2017   COPD (chronic obstructive pulmonary disease) (HCC) 11/18/2017   Diverticulosis of intestine with bleeding 09/05/2017   Acute respiratory failure with hypoxia (HCC) 08/07/2017   Depression with  anxiety 08/07/2017   Hyperlipidemia 08/07/2017   Macular degeneration 08/07/2017   Glaucoma 08/07/2017   Weakness 08/07/2017   Dehydration 08/07/2017   Lower GI bleeding 10/14/2016   Diverticulosis of colon 10/14/2016   Anxiety 10/14/2016   Lower GI bleed 10/14/2016   GI bleed    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   Dermatochalasis of both upper eyelids 08/17/2014   Osteoarthritis, multiple sites 12/20/2011    Orientation RESPIRATION BLADDER Height & Weight     Self, Time, Situation, Place  Normal Continent Weight: 170 lb 10.2 oz (77.4 kg) Height:  5\' 1"  (154.9 cm)  BEHAVIORAL SYMPTOMS/MOOD NEUROLOGICAL BOWEL NUTRITION STATUS      Continent Diet (Heart healthy diet)  AMBULATORY STATUS COMMUNICATION OF NEEDS Skin   Extensive Assist Verbally Other (Comment) (Erythema: buttocks, perineum; Ecchymosis: bilateral arms)                       Personal Care Assistance Level of Assistance  Bathing, Feeding, Dressing Bathing Assistance: Limited assistance Feeding assistance: Independent Dressing Assistance: Limited assistance     Functional Limitations Info  Sight, Hearing, Speech Sight Info: Impaired Hearing Info: Impaired      SPECIAL CARE FACTORS FREQUENCY  PT (By licensed PT), OT (By licensed OT)     PT Frequency: 5x's/week OT Frequency: 5x's/week            Contractures Contractures Info: Not present    Additional Factors Info  Code Status, Allergies, Psychotropic Code Status Info: DNR Allergies Info: Morphine And Codeine, Aspirin, Duloxetine  Hcl, Tymlos (Abaloparatide), Ventolin  (Albuterol ), Cefadroxil  Psychotropic Info: Zoloft          Current Medications (11/24/2023):  This is the current hospital active medication list Current Facility-Administered Medications  Medication Dose Route Frequency Provider Last Rate Last Admin   acetaminophen  (TYLENOL ) tablet 650 mg  650 mg Oral Q6H PRN Baldo Bonds, MD   650 mg at 11/23/23 0249   Or    acetaminophen  (TYLENOL ) suppository 650 mg  650 mg Rectal Q6H PRN Baldo Bonds, MD       cyanocobalamin  (VITAMIN B12) tablet 1,000 mcg  1,000 mcg Oral Daily Baldo Bonds, MD   1,000 mcg at 11/24/23 0808   ferrous gluconate  (FERGON) tablet 324 mg  324 mg Oral q morning Amin, Ankit C, MD   324 mg at 11/24/23 1610   glucagon (human recombinant) (GLUCAGEN) injection 1 mg  1 mg Intravenous PRN Baldo Bonds, MD       hydrALAZINE  (APRESOLINE ) injection 10 mg  10 mg Intravenous Q4H PRN Baldo Bonds, MD       ipratropium-albuterol  (DUONEB) 0.5-2.5 (3) MG/3ML nebulizer solution 3 mL  3 mL Nebulization Q4H PRN Baldo Bonds, MD       metoprolol  tartrate (LOPRESSOR ) injection 5 mg  5 mg Intravenous Q4H PRN Baldo Bonds, MD   5 mg at 11/22/23 9604   ondansetron  (ZOFRAN ) tablet 4 mg  4 mg Oral Q6H PRN Baldo Bonds, MD       Or   ondansetron  (ZOFRAN ) injection 4 mg  4 mg Intravenous Q6H PRN Baldo Bonds, MD       Oral care mouth rinse  15 mL Mouth Rinse PRN Baldo Bonds, MD       oxyCODONE  (Oxy IR/ROXICODONE ) immediate release tablet 5 mg  5 mg Oral Q4H PRN Amin, Ankit C, MD   5 mg at 11/23/23 1441   pantoprazole  (PROTONIX ) EC tablet 40 mg  40 mg Oral BID AC Amin, Ankit C, MD   40 mg at 11/24/23 5409   rOPINIRole  (REQUIP ) tablet 0.5 mg  0.5 mg Oral Daily PRN Amin, Ankit C, MD       senna-docusate (Senokot-S) tablet 1 tablet  1 tablet Oral QHS PRN Baldo Bonds, MD       sertraline  (ZOLOFT ) tablet 75 mg  75 mg Oral Daily Baldo Bonds, MD   75 mg at 11/24/23 8119   simvastatin  (ZOCOR ) tablet 20 mg  20 mg Oral QPM Baldo Bonds, MD   20 mg at 11/23/23 1757   traZODone  (DESYREL ) tablet 50 mg  50 mg Oral QHS PRN Baldo Bonds, MD   50 mg at 11/19/23 2338     Discharge Medications: Please see discharge summary for a list of discharge medications.  Relevant Imaging Results:  Relevant Lab Results:   Additional Information SSN:  [id number]  Zenon Hilda, LCSW

## 2023-11-25 DIAGNOSIS — K922 Gastrointestinal hemorrhage, unspecified: Secondary | ICD-10-CM | POA: Diagnosis not present

## 2023-11-25 LAB — MAGNESIUM: Magnesium: 2.2 mg/dL (ref 1.7–2.4)

## 2023-11-25 LAB — CBC
HCT: 26.9 % — ABNORMAL LOW (ref 36.0–46.0)
Hemoglobin: 8.1 g/dL — ABNORMAL LOW (ref 12.0–15.0)
MCH: 31.5 pg (ref 26.0–34.0)
MCHC: 30.1 g/dL (ref 30.0–36.0)
MCV: 104.7 fL — ABNORMAL HIGH (ref 80.0–100.0)
Platelets: 196 10*3/uL (ref 150–400)
RBC: 2.57 MIL/uL — ABNORMAL LOW (ref 3.87–5.11)
RDW: 14.9 % (ref 11.5–15.5)
WBC: 5.5 10*3/uL (ref 4.0–10.5)
nRBC: 0.4 % — ABNORMAL HIGH (ref 0.0–0.2)

## 2023-11-25 LAB — BASIC METABOLIC PANEL WITH GFR
Anion gap: 4 — ABNORMAL LOW (ref 5–15)
BUN: 9 mg/dL (ref 8–23)
CO2: 25 mmol/L (ref 22–32)
Calcium: 7.9 mg/dL — ABNORMAL LOW (ref 8.9–10.3)
Chloride: 105 mmol/L (ref 98–111)
Creatinine, Ser: 0.33 mg/dL — ABNORMAL LOW (ref 0.44–1.00)
GFR, Estimated: >60 mL/min (ref >60)
Glucose, Bld: 120 mg/dL — ABNORMAL HIGH (ref 70–99)
Potassium: 3.6 mmol/L (ref 3.5–5.1)
Sodium: 134 mmol/L — ABNORMAL LOW (ref 135–145)

## 2023-11-25 MED ORDER — POTASSIUM CHLORIDE CRYS ER 20 MEQ PO TBCR
40.0000 meq | EXTENDED_RELEASE_TABLET | Freq: Once | ORAL | Status: AC
  Administered 2023-11-25: 40 meq via ORAL
  Filled 2023-11-25: qty 2

## 2023-11-25 NOTE — Progress Notes (Signed)
 PROGRESS NOTE    [name]  UJW:[id number] DOB: [birth year] DOA: 11/18/2023 PCP: Arnetha Bhat, NP    Brief Narrative:  83 year old with history of anxiety, depression, chronic diastolic CHF, OA, glaucoma, HLD, HTN, pulmonary nodule, RLS, osteoarthritis, COPD, GERD, diverticular bleed with history of embolization, upper GI bleed, chronic anemia, gastritis comes to the hospital for multiple episodes of hematochezia.  Upon admission hemoglobin noted to be 5.3.  Video capsule shows mild gastritis, nodular area in duodenal bulb without any active bleeding.  EGD performed on 5/22 showed normal esophagus, gastritis, multiple gastric polyps.  No biopsies were taken.  Small bowel enteroscopy showed normal duodenal bulb.  At this time it may be beneficial for patient to get double balloon enteroscopy in the future if her anemia persists. Currently awaiting SNF placement.  Assessment & Plan:  Principal Problem:   Acute GI bleeding Active Problems:   Depression with anxiety   Hyperlipidemia   Glaucoma   Diverticulosis of intestine with bleeding   COPD (chronic obstructive pulmonary disease) (HCC)   Iron  deficiency anemia   Hypertension   GERD (gastroesophageal reflux disease)   Diastolic heart failure (HCC)   Class 1 obesity   Thoracic aorta atherosclerosis (HCC)   Hematochezia Lower GI bleed History of multiple GI bleed Acute on chronic anemia -Baseline hemoglobin 9.5, admission hemoglobin 5.3 requiring 3 unit PRBC transfusion.  Continue transfusion as necessary.  Hemoglobin 8.3. -Video capsule shows mild gastritis, nodular area in duodenal bulb without any active bleeding.  EGD performed on 5/22 showed normal esophagus, gastritis, multiple gastric polyps.  No biopsies were taken.  Small bowel enteroscopy showed normal duodenal bulb.  Recommendations are if the anemia persist, may pursue double-balloon enteroscopy at a tertiary care level - PPI twice daily - [facility] has signed  off   Colonoscopy 09/2023-hemorrhoids, ischemic colitis, multiple polyps, diverticulosis Endoscopy 09/2023-gastritis, gastric polyps  Hypokalemia -As needed repletion.   Depression - Statin  Glaucoma - Eyedrops  COPD - Not an active exacerbation.  As needed bronchodilators  Essential hypertension - On metoprolol .  IV as needed  GERD - PPI  Pulmonary nodule - Follow-up outpatient clinic  Congestive heart failure with preserved ejection fraction - No signs of volume overload  Osteoarthritis - Pain control  Restless leg syndrome - Requip   Depression - Zoloft   PT/OT-SNF, TOC consulted Labs every 3 days  DVT prophylaxis: SCDs Start: 11/18/23 5621 DNR/DNI Family Communication: Son updated periodically Status is: Inpatient PT recommending SNF    Subjective: Sitting up in the bed, no complaints this morning.  She is ready to go to rehab   Examination:  General exam: Appears calm and comfortable, hard of hearing; elderly frail Respiratory system: Clear to auscultation. Respiratory effort normal. Cardiovascular system: Sinus tachycardia.  Gastrointestinal system: Abdomen is nondistended, soft and nontender. No organomegaly or masses felt. Normal bowel sounds heard. Central nervous system: Alert and oriented. No focal neurological deficits. Extremities: Symmetric 5 x 5 power. Skin: No rashes, lesions or ulcers Psychiatry: Judgement and insight appear normal. Mood & affect appropriate.                Diet Orders (From admission, onward)     Start     Ordered   11/22/23 1242  Diet Heart Room service appropriate? Yes; Fluid consistency: Thin  Diet effective now       Question Answer Comment  Room service appropriate? Yes   Fluid consistency: Thin      11/22/23 1241  Objective: Vitals:   11/24/23 0444 11/24/23 1423 11/24/23 2113 11/25/23 0512  BP: (!) 119/57 (!) 120/45 130/62 (!) 118/52  Pulse: 92 88 100 81  Resp: 16 16 16 14    Temp: 98.8 F (37.1 C) 98.1 F (36.7 C) 98.9 F (37.2 C) 98.8 F (37.1 C)  TempSrc: Oral Oral Oral Oral  SpO2: 93% 96% 94% 95%  Weight:      Height:        Intake/Output Summary (Last 24 hours) at 11/25/2023 1216 Last data filed at 11/25/2023 0900 Gross per 24 hour  Intake 360 ml  Output 1650 ml  Net -1290 ml   Filed Weights   11/18/23 0154 11/18/23 0947 11/18/23 1100  Weight: 77.2 kg 77.2 kg 77.4 kg    Scheduled Meds:  cyanocobalamin   1,000 mcg Oral Daily   ferrous gluconate   324 mg Oral q morning   pantoprazole   40 mg Oral BID AC   sertraline   75 mg Oral Daily   simvastatin   20 mg Oral QPM   Continuous Infusions:  Nutritional status     Body mass index is 32.24 kg/m.  Data Reviewed:   CBC: Recent Labs  Lab 11/21/23 0310 11/22/23 0315 11/23/23 0650 11/24/23 0541 11/25/23 0508  WBC 5.6 5.4 6.5 6.8 5.5  HGB 7.9* 7.7* 8.3* 8.2* 8.1*  HCT 25.8* 25.7* 27.8* 26.9* 26.9*  MCV 105.7* 106.2* 104.1* 102.7* 104.7*  PLT PLATELET CLUMPS NOTED ON SMEAR, UNABLE TO ESTIMATE 168 178 189 196   Basic Metabolic Panel: Recent Labs  Lab 11/19/23 0302 11/19/23 0904 11/20/23 0254 11/21/23 0310 11/22/23 0315 11/23/23 0650 11/24/23 0541 11/25/23 0508  NA  --   --  141 140 139 139 136 134*  K  --   --  3.8 4.2 3.7 3.6 3.7 3.6  CL  --   --  112* 112* 110 107 103 105  CO2  --   --  23 22 24 24 24 25   GLUCOSE  --   --  130* 103* 119* 114* 120* 120*  BUN  --   --  13 12 12 11 12 9   CREATININE  --   --  <0.30* <0.30* 0.41* 0.47 0.48 0.33*  CALCIUM   --   --  7.9* 7.9* 8.1* 8.4* 8.3* 7.9*  MG   < >  --  2.0 2.1 2.1 2.2 2.3 2.2  PHOS  --  2.2* 1.8*  --   --   --   --   --    < > = values in this interval not displayed.   GFR: Estimated Creatinine Clearance: 48.4 mL/min (A) (by C-G formula based on SCr of 0.33 mg/dL (L)). Liver Function Tests: Recent Labs  Lab 11/19/23 0302  AST 14*  ALT 8  ALKPHOS 29*  BILITOT 0.8  PROT 4.5*  ALBUMIN 2.6*   No results for  input(s): "LIPASE", "AMYLASE" in the last 168 hours. No results for input(s): "AMMONIA" in the last 168 hours. Coagulation Profile: Recent Labs  Lab 11/20/23 0848  INR 1.4*   Cardiac Enzymes: No results for input(s): "CKTOTAL", "CKMB", "CKMBINDEX", "TROPONINI" in the last 168 hours. BNP (last 3 results) No results for input(s): "PROBNP" in the last 8760 hours. HbA1C: No results for input(s): "HGBA1C" in the last 72 hours. CBG: Recent Labs  Lab 11/21/23 0628 11/21/23 1120 11/21/23 2332 11/22/23 1213 11/22/23 1651  GLUCAP 117* 147* 140* 148* 114*   Lipid Profile: No results for input(s): "CHOL", "HDL", "LDLCALC", "TRIG", "CHOLHDL", "LDLDIRECT" in  the last 72 hours. Thyroid  Function Tests: No results for input(s): "TSH", "T4TOTAL", "FREET4", "T3FREE", "THYROIDAB" in the last 72 hours. Anemia Panel: No results for input(s): "VITAMINB12", "FOLATE", "FERRITIN", "TIBC", "IRON ", "RETICCTPCT" in the last 72 hours. Sepsis Labs: No results for input(s): "PROCALCITON", "LATICACIDVEN" in the last 168 hours.  Recent Results (from the past 240 hours)  MRSA Next Gen by PCR, Nasal     Status: None   Collection Time: 11/18/23 11:30 AM   Specimen: Nasal Mucosa; Nasal Swab  Result Value Ref Range Status   MRSA by PCR Next Gen NOT DETECTED NOT DETECTED Final    Comment: (NOTE) The GeneXpert MRSA Assay (FDA approved for NASAL specimens only), is one component of a comprehensive MRSA colonization surveillance program. It is not intended to diagnose MRSA infection nor to guide or monitor treatment for MRSA infections. Test performance is not FDA approved in patients less than 36 years old. Performed at [facility], [address], [place], [place] [address]          Radiology Studies: No results found.         LOS: 7 days   Time spent= 35 mins    Maggie Schooner, MD [facility]  If 7PM-7AM, please contact night-coverage  11/25/2023, 12:16 PM

## 2023-11-25 NOTE — Progress Notes (Signed)
 Occupational Therapy Treatment Patient Details Name: [name] MRN: [id number] DOB: Nov 03, 1940 Today's Date: 11/25/2023   History of present illness [name] is a 83 year old female who presented to the hospital with hematochezia and low hgb. Patient was admitted with LGI bleed.  [name], depression, chronic diastolic heart failure, osteoarthritis, glaucoma, hyperlipidemia, hypertension, pulmonary nodule, restless leg syndrome, osteoporosis, COPD, aortic arthrosclerosis, GERD, history of diverticular bleed with embolization, UGI bleed, chronic iron  deficiency anemia   OT comments  Patient seen for skilled OT session this afternoon. Patient extrememly motivated and open for all activity. Paerformed both seated and standing level assisted BADL's and mobility. See below for status. Encouraged OOB for meals and care coordination with nursing for such. OT will continue to follow on Acute unit to progress activity tolerance and function. Patient will benefit from continued inpatient follow up therapy, <3 hours/day.        If plan is discharge home, recommend the following:  A lot of help with walking and/or transfers;A lot of help with bathing/dressing/bathroom;Direct supervision/assist for medications management;Direct supervision/assist for financial management;Assist for transportation;Help with stairs or ramp for entrance   Equipment Recommendations  None recommended by OT       Precautions / Restrictions Precautions Precautions: Fall Precaution/Restrictions Comments: incontinence, legally blind Restrictions Weight Bearing Restrictions Per Provider Order: No       Mobility Bed Mobility Overal bed mobility: Needs Assistance Bed Mobility: Sit to Supine Rolling: Min assist, Used rails     Sit to supine: Min assist, HOB elevated, Used rails   General bed mobility comments: improved LB strength for management into bed    Transfers Overall transfer level: Needs  assistance Equipment used: Rolling walker (2 wheels) Transfers: Sit to/from Stand, Bed to chair/wheelchair/BSC Sit to Stand: Min assist     Step pivot transfers: Min assist     General transfer comment: multiple STS, side stepping, forward and back x 5 sets each with min A and min cues due to decreased vision and hearing     Balance Overall balance assessment: Needs assistance Sitting-balance support: Feet supported, No upper extremity supported Sitting balance-Leahy Scale: Fair     Standing balance support: Bilateral upper extremity supported Standing balance-Leahy Scale: Poor Standing balance comment: forward head posture                           ADL either performed or assessed with clinical judgement   ADL Overall ADL's : Needs assistance/impaired Eating/Feeding: Modified independent   Grooming: Wash/dry hands;Wash/dry face;Oral care;Brushing hair;Set up;Sitting   Upper Body Bathing: Minimal assistance;Sitting   Lower Body Bathing: Moderate assistance;Sitting/lateral leans   Upper Body Dressing : Minimal assistance;Sitting   Lower Body Dressing: Moderate assistance;Sit to/from stand Lower Body Dressing Details (indicate cue type and reason): able to figure 4 with both LE's for sock management this session Toilet Transfer: Minimal assistance;Rolling walker (2 wheels)           Functional mobility during ADLs: Minimal assistance;Rolling walker (2 wheels) General ADL Comments: improved basic activity tolerance for ADL's    Extremity/Trunk Assessment Upper Extremity Assessment Upper Extremity Assessment: Generalized weakness   Lower Extremity Assessment Lower Extremity Assessment: Generalized weakness                 Communication Communication Communication: Impaired Factors Affecting Communication: Hearing impaired   Cognition Arousal: Alert Behavior During Therapy: WFL for tasks assessed/performed Cognition: No apparent impairments  OT - Cognition Comments: A Ox4                 Following commands: Intact        Cueing   Cueing Techniques: Verbal cues        General Comments SpO2 on RA 97% throughout session    Pertinent Vitals/ Pain       Pain Assessment Pain Assessment: No/denies pain   Frequency  Min 2X/week        Progress Toward Goals  OT Goals(current goals can now be found in the care plan section)  Progress towards OT goals: Progressing toward goals  Acute Rehab OT Goals Patient Stated Goal: to keep moving forward OT Goal Formulation: With patient Time For Goal Achievement: 11/23/23 Potential to Achieve Goals: Good ADL Goals Pt Will Perform Grooming: with set-up;sitting Pt Will Perform Upper Body Bathing: with set-up;sitting Pt Will Perform Upper Body Dressing: with set-up;sitting Pt Will Transfer to Toilet: with contact guard assist;bedside commode Pt Will Perform Toileting - Clothing Manipulation and hygiene: with min assist;sitting/lateral leans Pt/caregiver will Perform Home Exercise Program: Increased strength;Both right and left upper extremity;With Supervision  Plan         AM-PAC OT "6 Clicks" Daily Activity     Outcome Measure   Help from another person eating meals?: A Little Help from another person taking care of personal grooming?: A Little Help from another person toileting, which includes using toliet, bedpan, or urinal?: A Lot Help from another person bathing (including washing, rinsing, drying)?: A Lot Help from another person to put on and taking off regular upper body clothing?: A Little Help from another person to put on and taking off regular lower body clothing?: A Lot 6 Click Score: 15    End of Session Equipment Utilized During Treatment: Gait belt;Rolling walker (2 wheels)  OT Visit Diagnosis: Unsteadiness on feet (R26.81);Muscle weakness (generalized) (M62.81);Low vision, both eyes (H54.2)   Activity Tolerance Patient tolerated  treatment well   Patient Left in bed;with call bell/phone within reach;with bed alarm set;with SCD's reapplied   Nurse Communication Mobility status        Time: 3474-2595 OT Time Calculation (min): 38 min  Charges: OT General Charges $OT Visit: 1 Visit OT Treatments $Therapeutic Activity: 8-22 mins $Neuromuscular Re-education: 8-22 mins  Alger Kerstein [name] Acute Rehabilitation Department  [phone]  11/25/2023, 2:31 PM

## 2023-11-25 NOTE — Plan of Care (Signed)

## 2023-11-25 NOTE — Plan of Care (Signed)

## 2023-11-25 NOTE — TOC Progression Note (Signed)
 Transition of Care [facility]) - Progression Note    Patient Details  Name: [name] MRN: [id number] Date of Birth: [birth year]  [facility] of [facility]) CM/SW Contact  Loreda Rodriguez, RN Phone Number:[phone]  11/25/2023, 3:20 PM  Clinical Narrative:    TOC following for patient from [facility]. Patient to return to [facility] level of care. PASRR is pending. Insurance [employer] is pending. CM spoke with Hospital doctor at [facility]. Per [facility] patient can not return to facility until insurance [employer] is approved. CM will continue to follow .    Expected Discharge Plan: Skilled Nursing Facility Barriers to Discharge: Insurance Authorization  Expected Discharge Plan and Services In-house Referral: NA Discharge Planning Services: NA   Living arrangements for the past 2 months: Assisted Living Facility                 DME Arranged: N/A DME Agency: NA       HH Arranged: NA HH Agency: NA         Social Determinants of Health (SDOH) Interventions SDOH Screenings   Food Insecurity: No Food Insecurity (11/18/2023)  Housing: Low Risk  (11/18/2023)  Transportation Needs: No Transportation Needs (11/18/2023)  Utilities: Not At Risk (11/18/2023)  Alcohol  Screen: Low Risk  (04/04/2023)  Depression (PHQ2-9): Low Risk  (10/24/2023)  Financial Resource Strain: Low Risk  (04/04/2023)  Physical Activity: Insufficiently Active (04/04/2023)  Social Connections: Socially Integrated (11/18/2023)  Stress: No Stress Concern Present (04/04/2023)  Tobacco Use: Medium Risk (11/18/2023)  Health Literacy: Adequate Health Literacy (04/04/2023)    Readmission Risk Interventions    11/19/2023   11:31 AM 04/29/2023   11:10 AM 04/21/2023    2:05 PM  Readmission Risk Prevention Plan  Transportation Screening Complete Complete Complete  PCP or Specialist Appt within 5-7 Days Complete    PCP or Specialist Appt within 3-5 Days  Complete Complete  Home Care Screening Complete     Medication Review (RN CM) Complete    HRI or Home Care Consult  Complete Complete  Social Work Consult for Recovery Care Planning/Counseling  Complete Complete  Palliative Care Screening  Not Applicable Not Applicable  Medication Review Oceanographer)  Complete Complete

## 2023-11-26 DIAGNOSIS — K9289 Other specified diseases of the digestive system: Secondary | ICD-10-CM | POA: Diagnosis not present

## 2023-11-26 DIAGNOSIS — K922 Gastrointestinal hemorrhage, unspecified: Secondary | ICD-10-CM | POA: Diagnosis not present

## 2023-11-26 DIAGNOSIS — H409 Unspecified glaucoma: Secondary | ICD-10-CM | POA: Diagnosis not present

## 2023-11-26 DIAGNOSIS — D649 Anemia, unspecified: Secondary | ICD-10-CM | POA: Diagnosis not present

## 2023-11-26 DIAGNOSIS — J449 Chronic obstructive pulmonary disease, unspecified: Secondary | ICD-10-CM | POA: Diagnosis not present

## 2023-11-26 DIAGNOSIS — J418 Mixed simple and mucopurulent chronic bronchitis: Secondary | ICD-10-CM | POA: Diagnosis not present

## 2023-11-26 DIAGNOSIS — N3946 Mixed incontinence: Secondary | ICD-10-CM | POA: Diagnosis not present

## 2023-11-26 DIAGNOSIS — Z7401 Bed confinement status: Secondary | ICD-10-CM | POA: Diagnosis not present

## 2023-11-26 DIAGNOSIS — I700 Atherosclerosis of aorta: Secondary | ICD-10-CM | POA: Diagnosis not present

## 2023-11-26 DIAGNOSIS — R58 Hemorrhage, not elsewhere classified: Secondary | ICD-10-CM | POA: Diagnosis not present

## 2023-11-26 DIAGNOSIS — M159 Polyosteoarthritis, unspecified: Secondary | ICD-10-CM | POA: Diagnosis not present

## 2023-11-26 DIAGNOSIS — Z743 Need for continuous supervision: Secondary | ICD-10-CM | POA: Diagnosis not present

## 2023-11-26 DIAGNOSIS — F419 Anxiety disorder, unspecified: Secondary | ICD-10-CM | POA: Diagnosis not present

## 2023-11-26 DIAGNOSIS — R531 Weakness: Secondary | ICD-10-CM | POA: Diagnosis not present

## 2023-11-26 DIAGNOSIS — K219 Gastro-esophageal reflux disease without esophagitis: Secondary | ICD-10-CM | POA: Diagnosis not present

## 2023-11-26 DIAGNOSIS — K5900 Constipation, unspecified: Secondary | ICD-10-CM | POA: Diagnosis not present

## 2023-11-26 DIAGNOSIS — F320 Major depressive disorder, single episode, mild: Secondary | ICD-10-CM | POA: Diagnosis not present

## 2023-11-26 DIAGNOSIS — I10 Essential (primary) hypertension: Secondary | ICD-10-CM | POA: Diagnosis not present

## 2023-11-26 DIAGNOSIS — M150 Primary generalized (osteo)arthritis: Secondary | ICD-10-CM | POA: Diagnosis not present

## 2023-11-26 DIAGNOSIS — I5032 Chronic diastolic (congestive) heart failure: Secondary | ICD-10-CM | POA: Diagnosis not present

## 2023-11-26 DIAGNOSIS — N3281 Overactive bladder: Secondary | ICD-10-CM | POA: Diagnosis not present

## 2023-11-26 MED ORDER — POTASSIUM CHLORIDE CRYS ER 20 MEQ PO TBCR: 20.0000 meq | EXTENDED_RELEASE_TABLET | Freq: Every day | ORAL | Status: DC

## 2023-11-26 NOTE — TOC Progression Note (Addendum)
 Transition of Care [facility]) - Progression Note    Patient Details  Name: AGAPE HARDIMAN MRN: [id number] Date of Birth: March 11, 1941  Transition of Care [facility]) CM/SW Contact  Loreda Rodriguez, RN Phone Number:[phone]  11/26/2023, 10:00 AM  Clinical Narrative:    PASRR# [id number]  Insurance [employer] remains pending requires medical review.   Per [facility] patients insurance [employer] has been approved from 5/28-6/3 (7days) Approved for sub acute level 1 Ref # [id number].   Expected Discharge Plan: Skilled Nursing Facility Barriers to Discharge: Insurance Authorization  Expected Discharge Plan and Services In-house Referral: NA Discharge Planning Services: NA   Living arrangements for the past 2 months: Assisted Living Facility                 DME Arranged: N/A DME Agency: NA       HH Arranged: NA HH Agency: NA         Social Determinants of Health (SDOH) Interventions SDOH Screenings   Food Insecurity: No Food Insecurity (11/18/2023)  Housing: Low Risk  (11/18/2023)  Transportation Needs: No Transportation Needs (11/18/2023)  Utilities: Not At Risk (11/18/2023)  Alcohol  Screen: Low Risk  (04/04/2023)  Depression (PHQ2-9): Low Risk  (10/24/2023)  Financial Resource Strain: Low Risk  (04/04/2023)  Physical Activity: Insufficiently Active (04/04/2023)  Social Connections: Socially Integrated (11/18/2023)  Stress: No Stress Concern Present (04/04/2023)  Tobacco Use: Medium Risk (11/18/2023)  Health Literacy: Adequate Health Literacy (04/04/2023)    Readmission Risk Interventions    11/19/2023   11:31 AM 04/29/2023   11:10 AM 04/21/2023    2:05 PM  Readmission Risk Prevention Plan  Transportation Screening Complete Complete Complete  PCP or Specialist Appt within 5-7 Days Complete    PCP or Specialist Appt within 3-5 Days  Complete Complete  Home Care Screening Complete    Medication Review (RN CM) Complete    HRI or Home Care Consult  Complete Complete  Social Work Consult  for Recovery Care Planning/Counseling  Complete Complete  Palliative Care Screening  Not Applicable Not Applicable  Medication Review Oceanographer)  Complete Complete

## 2023-11-26 NOTE — Progress Notes (Signed)
 Physical Therapy Treatment Patient Details Name: [name] MRN: [id number] DOB: 1940-09-29 Today's Date: 11/26/2023   History of Present Illness [name] is a 83 year old female who presented to the hospital with hematochezia and low hgb. Patient was admitted with LGI bleed.  [name], depression, chronic diastolic heart failure, osteoarthritis, glaucoma, hyperlipidemia, hypertension, pulmonary nodule, restless leg syndrome, osteoporosis, COPD, aortic arthrosclerosis, GERD, history of diverticular bleed with embolization, UGI bleed, chronic iron  deficiency anemia    PT Comments  The patient is very alert, demonstrates improved mobility, requiring less  assistance. Patient ambulated x 8 ' using RW and min assist, chair followed/  Patient should progress to increase ambulation distance next visit. Patient will benefit from continued inpatient follow up therapy, <3 hours/day     If plan is discharge home, recommend the following: A lot of help with walking and/or transfers;A lot of help with bathing/dressing/bathroom;Assist for transportation;Direct supervision/assist for medications management   Can travel by private vehicle     No  Equipment Recommendations  None recommended by PT    Recommendations for Other Services       Precautions / Restrictions Precautions Precautions: Fall Precaution/Restrictions Comments: incontinence, legally blind Restrictions Weight Bearing Restrictions Per Provider Order: No     Mobility  Bed Mobility Overal bed mobility: Needs Assistance Bed Mobility: Supine to Sit     Supine to sit: Contact guard, HOB elevated, Used rails     General bed mobility comments: improved LB strength for management into bed    Transfers Overall transfer level: Needs assistance Equipment used: Rolling walker (2 wheels) Transfers: Sit to/from Stand Sit to Stand: Min assist, +2 safety/equipment   Step pivot transfers: Min assist       General  transfer comment: imroved STS at RW, Step to recliner, cues for hand placement    Ambulation/Gait Ambulation/Gait assistance: Min assist, +2 safety/equipment Gait Distance (Feet): 8 Feet Assistive device: Rolling walker (2 wheels) Gait Pattern/deviations: Step-to pattern, Step-through pattern       General Gait Details: patient improved in balance and able to ambulate   Stairs             Wheelchair Mobility     Tilt Bed    Modified Rankin (Stroke Patients Only)       Balance   Sitting-balance support: Feet supported, No upper extremity supported Sitting balance-Leahy Scale: Fair     Standing balance support: Bilateral upper extremity supported Standing balance-Leahy Scale: Poor Standing balance comment: forward head posture                            Communication Communication Communication: Impaired Factors Affecting Communication: Hearing impaired  Cognition Arousal: Alert     PT - Cognitive impairments: No apparent impairments                         Following commands: Intact      Cueing Cueing Techniques: Verbal cues  Exercises General Exercises - Lower Extremity Ankle Circles/Pumps: AROM, Both, 20 reps Long Arc Quad: AROM, Strengthening, Both, 20 reps Hip ABduction/ADduction: AROM, Both, 20 reps Hip Flexion/Marching: AROM, Both, 20 reps    General Comments        Pertinent Vitals/Pain Pain Assessment Faces Pain Scale: No hurt    Home Living  Prior Function            PT Goals (current goals can now be found in the care plan section) Progress towards PT goals: Progressing toward goals    Frequency    Min 2X/week      PT Plan      Co-evaluation              AM-PAC PT "6 Clicks" Mobility   Outcome Measure  Help needed turning from your back to your side while in a flat bed without using bedrails?: A Little Help needed moving from lying on your back to  sitting on the side of a flat bed without using bedrails?: A Little Help needed moving to and from a bed to a chair (including a wheelchair)?: A Little Help needed standing up from a chair using your arms (e.g., wheelchair or bedside chair)?: A Little Help needed to walk in hospital room?: A Lot Help needed climbing 3-5 steps with a railing? : Total 6 Click Score: 15    End of Session Equipment Utilized During Treatment: Gait belt   Patient left: in chair;with call bell/phone within reach;with chair alarm set Nurse Communication: Mobility status PT Visit Diagnosis: Difficulty in walking, not elsewhere classified (R26.2);Muscle weakness (generalized) (M62.81)     Time: 1044-1100 PT Time Calculation (min) (ACUTE ONLY): 16 min  Charges:    $Gait Training: 8-22 mins PT General Charges $$ ACUTE PT VISIT: 1 Visit                     Abelina Hoes PT Acute Rehabilitation Services Office [phone]    Dareen Ebbing 11/26/2023, 11:09 AM

## 2023-11-26 NOTE — Plan of Care (Signed)

## 2023-11-26 NOTE — Care Management Important Message (Signed)
 Important Message  Patient Details  Name: [name] MRN: [id number] Date of Birth: 1940/09/24   Important Message Given:  Yes - Medicare IM (mailed to address on file)     Neila Bally 11/26/2023, 2:31 PM

## 2023-11-26 NOTE — Discharge Summary (Signed)
 Physician Discharge Summary  [name]:[id number] DOB: [birth year]  PCP: Arnetha Bhat, NP  Admitted from: Home Discharged to: SNF  Admit date: 11/18/2023 Discharge date: 11/26/2023  Recommendations for Outpatient Follow-up:    Contact information for follow-up providers     MD at SNF. Schedule an appointment as soon as possible for a visit.   Why: To be seen with repeat labs (CBC & BMP).        Baldo Bonds, MD. Schedule an appointment as soon as possible for a visit.   Specialty: Gastroenterology Why: As needed Contact information: [address] [place] [address] [phone]         Arnetha Bhat, NP. Schedule an appointment as soon as possible for a visit.   Specialty: Adult Health Nurse Practitioner Why: To be seen upon discharge from SNF. Contact information: [address] [place] [address] [phone]              Contact information for after-discharge care     Destination     [facility] .   Service: Skilled Nursing Contact information: [address] [place]  [address] [phone]                      Home Health: None    Equipment/Devices: None.  TBD at SNF    Discharge Condition: Improved and stable.   Code Status: Do not attempt resuscitation (DNR) PRE-ARREST INTERVENTIONS DESIRED Diet recommendation:  Discharge Diet Orders (From admission, onward)     Start     Ordered   11/26/23 0000  Diet - low sodium heart healthy        11/26/23 1311             Discharge Diagnoses:  Principal Problem:   Acute GI bleeding Active Problems:   Depression with anxiety   Hyperlipidemia   Glaucoma   Diverticulosis of intestine with bleeding   COPD (chronic obstructive pulmonary disease) (HCC)   Iron  deficiency anemia   Hypertension   GERD (gastroesophageal reflux disease)   Diastolic heart failure (HCC)   Class 1 obesity   Thoracic aorta  atherosclerosis (HCC)   Brief Summary: 83 year old with history of anxiety, depression, chronic diastolic CHF, OA, glaucoma, HLD, HTN, pulmonary nodule, RLS, osteoarthritis, COPD, GERD, diverticular bleed with history of embolization, upper GI bleed, chronic anemia, gastritis comes to the hospital for multiple episodes of hematochezia.  Upon admission hemoglobin noted to be 5.3.  Video capsule shows mild gastritis, nodular area in duodenal bulb without any active bleeding.  EGD performed on 5/22 showed normal esophagus, gastritis, multiple gastric polyps.  No biopsies were taken.  Small bowel enteroscopy showed normal duodenal bulb.  At this time it may be beneficial for patient to get double balloon enteroscopy in the future if her anemia persists.  This will need referral to a tertiary hospital facility that does this.    Assessment & Plan:  Hematochezia Lower GI bleed History of multiple GI bleed Acute on chronic anemia -Baseline hemoglobin 9.5, admission hemoglobin 5.3 requiring 3 unit PRBC transfusion.  Hemoglobin 8.3 >8 0.2 > 8.1.  Anemia stable. -Video capsule shows mild gastritis, nodular area in duodenal bulb without any active bleeding.  EGD performed on 5/22 showed normal esophagus, gastritis, multiple gastric polyps.  No biopsies were taken.  Small bowel enteroscopy showed normal duodenal bulb.  Recommendations are if the anemia persist, may pursue double-balloon enteroscopy at a  tertiary care level - PPI twice daily - [facility] has signed off -No active bleeding reported by patient or nursing.  Recommend close follow-up of CBC at SNF may be twice per week to ensure stability.    Colonoscopy 09/2023-hemorrhoids, ischemic colitis, multiple polyps, diverticulosis Endoscopy 09/2023-gastritis, gastric polyps   Hypokalemia Replaced.  Continue low-dose potassium supplements while on diuretics.  Follow BMP closely at SNF.   Dyslipidemia - Statin to continue  Depression Stable.  Continue  home dose of Zoloft .   Glaucoma - Eyedrops   COPD - Not an active exacerbation.  Continue PTA home regimen.   Essential hypertension Controlled off of meds.   GERD - PPI, now twice daily as per GI recommendations.   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  Continue prior home dose of torsemide  20 Mg daily.  It appears that patient may have been on oral potassium supplements 20 mEq daily rather than a single dose but not fully sure.  Continue potassium supplements 20 mEq daily with close monitoring of BMP at SNF.  Osteoarthritis - Pain control.  Has not required much of opioids since hospital admission.   Restless leg syndrome - Requip     Consultations: [facility]  Procedures: As noted above   Discharge Instructions  Discharge Instructions     (HEART FAILURE PATIENTS) Call MD:  Anytime you have any of the following symptoms: 1) 3 pound weight gain in 24 hours or 5 pounds in 1 week 2) shortness of breath, with or without a dry hacking cough 3) swelling in the hands, feet or stomach 4) if you have to sleep on extra pillows at night in order to breathe.   Complete by: As directed    Call MD for:   Complete by: As directed    Recurrent blood in the stools, black tarry stools, vomiting blood or coffee ground material.   Call MD for:  difficulty breathing, headache or visual disturbances   Complete by: As directed    Call MD for:  extreme fatigue   Complete by: As directed    Call MD for:  persistant dizziness or light-headedness   Complete by: As directed    Call MD for:  persistant nausea and vomiting   Complete by: As directed    Call MD for:  severe uncontrolled pain   Complete by: As directed    Call MD for:  temperature >100.4   Complete by: As directed    Diet - low sodium heart healthy   Complete by: As directed    Increase activity slowly   Complete by:  As directed         Medication List     STOP taking these medications    amoxicillin -clavulanate 875-125 MG tablet Commonly known as: AUGMENTIN        TAKE these medications    acetaminophen  650 MG CR tablet Commonly known as: TYLENOL  Take 1,300 mg by mouth in the morning and at bedtime.   acetaminophen  500 MG tablet Commonly known as: TYLENOL  Take 500 mg by mouth daily as needed for moderate pain (pain score 4-6).   albuterol  108 (90 Base) MCG/ACT inhaler Commonly known as: VENTOLIN  HFA Inhale 2 puffs into the lungs every 4 (four) hours as needed for wheezing or shortness of breath.   alendronate 70 MG tablet Commonly known as: FOSAMAX Take 70 mg by mouth every Sunday. Take with a full  glass of water on an empty stomach.   Align 4 MG Caps Take 4 mg by mouth at bedtime.   Anoro Ellipta  62.5-25 MCG/ACT Aepb Generic drug: umeclidinium-vilanterol Inhale 1 puff into the lungs every morning.   cetirizine  5 MG chewable tablet Commonly known as: ZYRTEC  Chew 5 mg by mouth daily as needed for allergies.   cyanocobalamin  1000 MCG tablet Commonly known as: VITAMIN B12 Take 1,000 mcg by mouth daily.   docusate sodium  100 MG capsule Commonly known as: COLACE Take 200 mg by mouth at bedtime.   Dorzolamide  HCl-Timolol  Mal PF 2-0.5 % Soln Place 1 drop into both eyes in the morning and at bedtime.   ferrous gluconate  240 (27 FE) MG tablet Commonly known as: FERGON Take 240 mg by mouth every morning. Take without food   fluticasone  50 MCG/ACT nasal spray Commonly known as: FLONASE  Place 1 spray into both nostrils daily as needed for allergies or rhinitis.   guaiFENesin  100 MG/5ML liquid Commonly known as: ROBITUSSIN Take 10 mLs by mouth every 4 (four) hours as needed for cough or to loosen phlegm.   latanoprost  0.005 % ophthalmic solution Commonly known as: XALATAN  Place 1 drop into both eyes at bedtime.   oxybutynin  10 MG 24 hr tablet Commonly known as:  DITROPAN -XL Take 10 mg by mouth at bedtime.   pantoprazole  40 MG tablet Commonly known as: PROTONIX  Take 1 tablet (40 mg total) by mouth 2 (two) times daily before a meal. What changed: when to take this   polyethylene glycol 17 g packet Commonly known as: MIRALAX  / GLYCOLAX  Take 8.5 g by mouth daily as needed (constipation).   potassium chloride  SA 20 MEQ tablet Commonly known as: KLOR-CON  M Take 1 tablet (20 mEq total) by mouth daily. What changed: when to take this   PreserVision AREDS 2 Caps Take 1 capsule by mouth 2 (two) times daily.   PreviDent 5000 Booster Plus 1.1 % Pste Generic drug: Sodium Fluoride  Place 1 Application onto teeth See admin instructions. Brush on teeth with a toothbrush after evening mouth care. Spit out excess and do not rinse.   Rhopressa  0.02 % Soln Generic drug: Netarsudil  Dimesylate Place 1 drop into both eyes at bedtime.   rOPINIRole  0.5 MG tablet Commonly known as: REQUIP  Take 1 tablet (0.5 mg total) by mouth daily as needed.   sertraline  50 MG tablet Commonly known as: ZOLOFT  Take 1.5 tablets (75 mg total) by mouth daily.   simvastatin  20 MG tablet Commonly known as: ZOCOR  Take 20 mg by mouth every evening.   torsemide  20 MG tablet Commonly known as: DEMADEX  Take 20 mg by mouth daily.   vitamin C  1000 MG tablet Take 1,000 mg by mouth every morning.   Vitamin D3 50 MCG (2000 UT) Tabs Take 2,000 Units by mouth every morning.   zinc oxide 20 % ointment Apply 1 Application topically See admin instructions. Apply topically to buttocks after every incontinent episode and as needed for redness       Allergies  Allergen Reactions   Morphine And Codeine Itching   Aspirin Other (See Comments)    Unknown reaction   Duloxetine  Hcl Other (See Comments)    Unknown reaction   Tymlos [Abaloparatide] Other (See Comments)    Unknown reaction   Ventolin  [Albuterol ] Other (See Comments)    rapid heart beat   Cefadroxil Hives    Patient  can take amoxicillin  and cipro       Procedures/Studies: No results found.  Subjective: Patient reports that she is having a hard time reaching out to her husband and wants some help.  Reports painful BM last night and this morning but does not know the color.  No abdominal pain.  Tolerating diet without nausea, vomiting or abdominal pain.  As per nursing, no acute issues reported.  Discharge Exam:  Vitals:   11/25/23 1311 11/25/23 1753 11/25/23 2014 11/26/23 0525  BP: (!) 130/56 (!) 115/50 (!) 112/57 (!) 122/57  Pulse: 80 (!) 102 86 78  Resp: 18 18 18 18   Temp: 97.7 F (36.5 C) 98.1 F (36.7 C) 98.4 F (36.9 C) 97.8 F (36.6 C)  TempSrc: Oral Oral Oral Oral  SpO2: 96% 94% 94% 96%  Weight:      Height:        General: Elderly female, small built and frail sitting up comfortably in a reclining chair without distress.  Oral mucosa moist. Cardiovascular: S1 & S2 heard, RRR, S1/S2 +. No murmurs, rubs, gallops or clicks. No JVD or pedal edema. Respiratory: Clear to auscultation without wheezing, rhonchi or crackles. No increased work of breathing. Abdominal:  Non distended, non tender & soft. No organomegaly or masses appreciated. Normal bowel sounds heard. CNS: Alert and oriented. No focal deficits. Extremities: no edema, no cyanosis    The results of significant diagnostics from this hospitalization (including imaging, microbiology, ancillary and laboratory) are listed below for reference.     Microbiology: Recent Results (from the past 240 hours)  MRSA Next Gen by PCR, Nasal     Status: None   Collection Time: 11/18/23 11:30 AM   Specimen: Nasal Mucosa; Nasal Swab  Result Value Ref Range Status   MRSA by PCR Next Gen NOT DETECTED NOT DETECTED Final    Comment: (NOTE) The GeneXpert MRSA Assay (FDA approved for NASAL specimens only), is one component of a comprehensive MRSA colonization surveillance program. It is not intended to diagnose MRSA infection nor to  guide or monitor treatment for MRSA infections. Test performance is not FDA approved in patients less than 10 years old. Performed at [facility] [address], [place], [place] [address]      Labs: CBC: Recent Labs  Lab 11/21/23 0310 11/22/23 0315 11/23/23 0650 11/24/23 0541 11/25/23 0508  WBC 5.6 5.4 6.5 6.8 5.5  HGB 7.9* 7.7* 8.3* 8.2* 8.1*  HCT 25.8* 25.7* 27.8* 26.9* 26.9*  MCV 105.7* 106.2* 104.1* 102.7* 104.7*  PLT PLATELET CLUMPS NOTED ON SMEAR, UNABLE TO ESTIMATE 168 178 189 196    Basic Metabolic Panel: Recent Labs  Lab 11/20/23 0254 11/21/23 0310 11/22/23 0315 11/23/23 0650 11/24/23 0541 11/25/23 0508  NA 141 140 139 139 136 134*  K 3.8 4.2 3.7 3.6 3.7 3.6  CL 112* 112* 110 107 103 105  CO2 23 22 24 24 24 25   GLUCOSE 130* 103* 119* 114* 120* 120*  BUN 13 12 12 11 12 9   CREATININE <0.30* <0.30* 0.41* 0.47 0.48 0.33*  CALCIUM  7.9* 7.9* 8.1* 8.4* 8.3* 7.9*  MG 2.0 2.1 2.1 2.2 2.3 2.2  PHOS 1.8*  --   --   --   --   --     Liver Function Tests: No results for input(s): "AST", "ALT", "ALKPHOS", "BILITOT", "PROT", "ALBUMIN" in the last 168 hours.  CBG: Recent Labs  Lab 11/21/23 0628 11/21/23 1120 11/21/23 2332 11/22/23 1213 11/22/23 1651  GLUCAP 117* 147* 140* 148* 114*   I discussed in detail with patient's son Dr. Candise Chambers.  Updated care and answered all questions.  Advised him that I was not truly sure if her potassium supplements were supposed to be taken 20 mEq daily or if it was a single dose pta.  Since patient is on torsemide , creatinine is normal, potassium in the mid 3 range, changed potassium supplements to 20 mEq daily with close follow-up of BMP at facility.  He verbalized understanding.  Time coordinating discharge: 35 minutes  SIGNED:  Aubrey Blas, MD,  FACP, [facility], [facility], [facility]   [facility] Phys[facility] Advisor [place]     To contact the attending provider between 7A-7P or the  covering provider during after hours 7P-7A, please log into the web site [email or web address] and access using universal [place] password for that web site. If you do not have the password, please call the hospital operator.

## 2023-11-26 NOTE — Progress Notes (Signed)
 Report called to [facility]. Pt's husband notified of discharge to facility and is currently at bedside.

## 2023-11-26 NOTE — TOC Transition Note (Addendum)
 Transition of Care [facility]) - Discharge Note   Patient Details  Name: [name] MRN: [id number] Date of Birth: Sep 02, 1940  Transition [facility]) CM/SW Contact:  Loreda Rodriguez, RN Phone Number:[phone]  11/26/2023, 2:31 PM   Clinical Narrative:    Patient with discharge orders. Discharging to [facility]. Transport has been arranged per PTAR. D/c packet is at nurses station. Husband is at bedside and has been made aware. No other TOC needs noted.   Nursing please call report to  [facility] [place] Rm# [phone] ext [phone] or ext [phone]   Final next level of care: Skilled Nursing Facility Barriers to Discharge: No Barriers Identified   Patient Goals and CMS Choice Patient states their goals for this hospitalization and ongoing recovery are:: return to ALF CMS Medicare.gov Compare Post Acute Care list provided to::  (NA) Choice offered to / list presented to : NA [place] ownership interest in [facility].provided to::  (NA)    Discharge Placement                       Discharge Plan and Services Additional resources added to the After Visit Summary for   In-house Referral: NA Discharge Planning Services: NA            DME Arranged: N/A DME Agency: NA       HH Arranged: NA HH Agency: NA        Social Drivers of Health (SDOH) Interventions SDOH Screenings   Food Insecurity: No Food Insecurity (11/18/2023)  Housing: Low Risk  (11/18/2023)  Transportation Needs: No Transportation Needs (11/18/2023)  Utilities: Not At Risk (11/18/2023)  Alcohol  Screen: Low Risk  (04/04/2023)  Depression (PHQ2-9): Low Risk  (10/24/2023)  Financial Resource Strain: Low Risk  (04/04/2023)  Physical Activity: Insufficiently Active (04/04/2023)  Social Connections: Socially Integrated (11/18/2023)  Stress: No Stress Concern Present (04/04/2023)  Tobacco Use: Medium Risk (11/18/2023)  Health Literacy: Adequate Health Literacy (04/04/2023)      Readmission Risk Interventions    11/19/2023   11:31 AM 04/29/2023   11:10 AM 04/21/2023    2:05 PM  Readmission Risk Prevention Plan  Transportation Screening Complete Complete Complete  PCP or Specialist Appt within 5-7 Days Complete    PCP or Specialist Appt within 3-5 Days  Complete Complete  Home Care Screening Complete    Medication Review (RN CM) Complete    HRI or Home Care Consult  Complete Complete  Social Work Consult for Recovery Care Planning/Counseling  Complete Complete  Palliative Care Screening  Not Applicable Not Applicable  Medication Review Oceanographer)  Complete Complete

## 2023-11-26 NOTE — Discharge Instructions (Signed)

## 2023-11-27 ENCOUNTER — Non-Acute Institutional Stay (SKILLED_NURSING_FACILITY): Payer: Self-pay | Admitting: Sports Medicine

## 2023-11-27 DIAGNOSIS — N3946 Mixed incontinence: Secondary | ICD-10-CM | POA: Diagnosis not present

## 2023-11-27 DIAGNOSIS — F320 Major depressive disorder, single episode, mild: Secondary | ICD-10-CM

## 2023-11-27 DIAGNOSIS — K5900 Constipation, unspecified: Secondary | ICD-10-CM

## 2023-11-27 DIAGNOSIS — J418 Mixed simple and mucopurulent chronic bronchitis: Secondary | ICD-10-CM

## 2023-11-27 DIAGNOSIS — K922 Gastrointestinal hemorrhage, unspecified: Secondary | ICD-10-CM | POA: Diagnosis not present

## 2023-11-27 DIAGNOSIS — M150 Primary generalized (osteo)arthritis: Secondary | ICD-10-CM | POA: Diagnosis not present

## 2023-11-27 DIAGNOSIS — K219 Gastro-esophageal reflux disease without esophagitis: Secondary | ICD-10-CM | POA: Diagnosis not present

## 2023-11-27 NOTE — Progress Notes (Signed)
 Provider:  Dr. Tye Gall Location:  [facility] Place of Service:   Skilled care   PCP: Arnetha Bhat, NP Patient Care Team: Arnetha Bhat, NP as PCP - General (Adult Health Nurse Practitioner) Marguerite Shiley, MD as Consulting Physician (Internal Medicine)  Extended Emergency Contact Information Primary Emergency Contact: Pound,[name] Address: [address]          [place], [place] [address] [place] Home Phone: [phone] Mobile Phone: [phone] Relation: Spouse Secondary Emergency Contact: Bebout MD,[name] Tracey Friday Phone: [phone] Relation: Son Interpreter needed? No  Goals of Care: Advanced Directive information    11/18/2023    1:56 AM  Advanced Directives  Does Patient Have a Medical Advance Directive? Yes  Type of Advance Directive Out of facility DNR (pink MOST or yellow form)  Does patient want to make changes to medical advance directive? No - Patient declined  Pre-existing out of facility DNR order (yellow form or pink MOST form) Physician notified to receive inpatient order      No chief complaint on file.      History of Present Illness  HPI: Patient is a 83 y.o. female seen today for admission to [facility].   Patient seen and examined in her room.  She is sitting in the recliner chair.  Seems pleasant and comfortable and does not appear to be in distress. Patient denies abdominal pain nausea, vomiting, bloody or dark-colored stools.  Reports like poor appetite.  She feels Depressed that she is not able to do a lot of activities.  She is waiting for the therapist . Patient denies chest pain, shortness of breath, palpitations, dizzy, lightheadedness. As per discharge summary Discharge Diagnoses:  Principal Problem:   Acute GI bleeding Active Problems:   Depression with anxiety   Hyperlipidemia   Glaucoma   Diverticulosis of intestine with bleeding   COPD (chronic obstructive pulmonary  disease) (HCC)   Iron  deficiency anemia   Hypertension   GERD (gastroesophageal reflux disease)   Diastolic heart failure (HCC)   Class 1 obesity   Thoracic aorta atherosclerosis (HCC)    Assessment & Plan:   Hematochezia Lower GI bleed History of multiple GI bleed Acute on chronic anemia -Baseline hemoglobin 9.5, admission hemoglobin 5.3 requiring 3 unit PRBC transfusion.  Hemoglobin 8.3 >8 0.2 > 8.1.  Anemia stable. -Video capsule shows mild gastritis, nodular area in duodenal bulb without any active bleeding.  EGD performed on 5/22 showed normal esophagus, gastritis, multiple gastric polyps.  No biopsies were taken.  Small bowel enteroscopy showed normal duodenal bulb.  Recommendations are if the anemia persist, may pursue double-balloon enteroscopy at a tertiary care level - PPI twice daily - [facility] has signed off -No active bleeding reported by patient or nursing.  Recommend close follow-up of CBC at SNF may be twice per week to ensure stability.    Colonoscopy 09/2023-hemorrhoids, ischemic colitis, multiple polyps, diverticulosis Endoscopy 09/2023-gastritis, gastric polyps   Past Medical History:  Diagnosis Date   Anxiety    Arthritis    "bilateral knees, shoulders, elbows; neck, pretty widespread" (09/05/2017)   BPPV (benign paroxysmal positional vertigo)    Depression    GERD (gastroesophageal reflux disease)    Glaucoma, both eyes    Headache    "probably 2/month" (09/05/2017)   History of blood transfusion ~ 2008   "related to LGIB"   Hyperlipemia    Lower GI bleeding ~ 2008; 09/05/2017   "had to have blood transfusion"  Macular degeneration, bilateral    Osteopenia    Seasonal allergies    Skin cancer, basal cell 2001   "off my nose, left side"   Sleeping excessive    Tinnitus of both ears    Past Surgical History:  Procedure Laterality Date   BALLOON DILATION N/A 05/08/2021   Procedure: BALLOON DILATION;  Surgeon: Genell Ken, MD;  Location: [facility] ENDOSCOPY;   Service: Gastroenterology;  Laterality: N/A;   BASAL CELL CARCINOMA EXCISION  2001   "off my nose, left side"   BIOPSY  05/08/2021   Procedure: BIOPSY;  Surgeon: Genell Ken, MD;  Location: [facility] ENDOSCOPY;  Service: Gastroenterology;;   BIOPSY  04/25/2023   Procedure: BIOPSY;  Surgeon: Baldo Bonds, MD;  Location: [facility] ENDOSCOPY;  Service: Gastroenterology;;   BLEPHAROPLASTY Bilateral    BONE BIOPSY  10/26/2023   Procedure: BIOPSY, GI;  Surgeon: Felecia Hopper, MD;  Location: [facility] ENDOSCOPY;  Service: Gastroenterology;;   CATARACT EXTRACTION W/ INTRAOCULAR LENS  IMPLANT, BILATERAL Bilateral 1990's   COLONOSCOPY N/A 10/26/2023   Procedure: COLONOSCOPY;  Surgeon: Felecia Hopper, MD;  Location: [facility] ENDOSCOPY;  Service: Gastroenterology;  Laterality: N/A;   COLONOSCOPY WITH PROPOFOL  N/A 05/04/2022   Procedure: COLONOSCOPY WITH PROPOFOL ;  Surgeon: Genell Ken, MD;  Location: [facility] ENDOSCOPY;  Service: Gastroenterology;  Laterality: N/A;   COLONOSCOPY WITH PROPOFOL  N/A 04/25/2023   Procedure: COLONOSCOPY WITH PROPOFOL ;  Surgeon: Baldo Bonds, MD;  Location: [facility] ENDOSCOPY;  Service: Gastroenterology;  Laterality: N/A;   ENTEROSCOPY N/A 11/20/2023   Procedure: ENTEROSCOPY;  Surgeon: Baldo Bonds, MD;  Location: [facility] ENDOSCOPY;  Service: Gastroenterology;  Laterality: N/A;   ESOPHAGOGASTRODUODENOSCOPY N/A 10/26/2023   Procedure: EGD (ESOPHAGOGASTRODUODENOSCOPY);  Surgeon: Felecia Hopper, MD;  Location: [facility] ENDOSCOPY;  Service: Gastroenterology;  Laterality: N/A;   ESOPHAGOGASTRODUODENOSCOPY N/A 11/20/2023   Procedure: EGD (ESOPHAGOGASTRODUODENOSCOPY);  Surgeon: Baldo Bonds, MD;  Location: [facility] ENDOSCOPY;  Service: Gastroenterology;  Laterality: N/A;   ESOPHAGOGASTRODUODENOSCOPY (EGD) WITH PROPOFOL  N/A 05/08/2021   Procedure: ESOPHAGOGASTRODUODENOSCOPY (EGD) WITH PROPOFOL ;  Surgeon: Genell Ken, MD;  Location: [facility] ENDOSCOPY;  Service: Gastroenterology;  Laterality: N/A;    ESOPHAGOGASTRODUODENOSCOPY (EGD) WITH PROPOFOL  N/A 01/11/2022   Procedure: ESOPHAGOGASTRODUODENOSCOPY (EGD) WITH PROPOFOL ;  Surgeon: Ozell Blunt, MD;  Location: [facility] ENDOSCOPY;  Service: Gastroenterology;  Laterality: N/A;   ESOPHAGOGASTRODUODENOSCOPY (EGD) WITH PROPOFOL  N/A 01/06/2023   Procedure: ESOPHAGOGASTRODUODENOSCOPY (EGD) WITH PROPOFOL ;  Surgeon: Ozell Blunt, MD;  Location: [facility] ENDOSCOPY;  Service: Gastroenterology;  Laterality: N/A;   ESOPHAGOGASTRODUODENOSCOPY (EGD) WITH PROPOFOL  N/A 04/21/2023   Procedure: ESOPHAGOGASTRODUODENOSCOPY (EGD) WITH PROPOFOL ;  Surgeon: Baldo Bonds, MD;  Location: [facility] ENDOSCOPY;  Service: Gastroenterology;  Laterality: N/A;   EYE SURGERY Bilateral    "to improve vision after cataract OR"   GIVENS CAPSULE STUDY N/A 04/21/2023   Procedure: GIVENS CAPSULE STUDY;  Surgeon: Baldo Bonds, MD;  Location: [facility] ENDOSCOPY;  Service: Gastroenterology;  Laterality: N/A;   GIVENS CAPSULE STUDY N/A 11/18/2023   Procedure: IMAGING PROCEDURE, GI TRACT, INTRALUMINAL, VIA CAPSULE;  Surgeon: Baldo Bonds, MD;  Location: [facility] ENDOSCOPY;  Service: Gastroenterology;  Laterality: N/A;   HEMOSTASIS CLIP PLACEMENT  10/26/2023   Procedure: CONTROL OF HEMORRHAGE, GI TRACT, ENDOSCOPIC, BY CLIPPING OR OVERSEWING;  Surgeon: Felecia Hopper, MD;  Location: [facility] ENDOSCOPY;  Service: Gastroenterology;;   IR ANGIOGRAM FOLLOW UP STUDY  12/19/2020   IR ANGIOGRAM SELECTIVE EACH ADDITIONAL VESSEL  12/19/2020   IR ANGIOGRAM SELECTIVE EACH ADDITIONAL VESSEL  12/19/2020   IR ANGIOGRAM VISCERAL SELECTIVE  12/19/2020   IR EMBO ART  VEN HEMORR LYMPH EXTRAV  INC GUIDE ROADMAPPING  12/19/2020   IR US  GUIDE VASC ACCESS RIGHT  12/19/2020   JOINT REPLACEMENT     POLYPECTOMY  05/04/2022   Procedure: POLYPECTOMY;  Surgeon: Genell Ken, MD;  Location: [facility] ENDOSCOPY;  Service: Gastroenterology;;   POLYPECTOMY  10/26/2023   Procedure: POLYPECTOMY, INTESTINE;  Surgeon: Felecia Hopper, MD;  Location: [facility]  ENDOSCOPY;  Service: Gastroenterology;;   STAPEDES SURGERY Left    "scraped stapedes because it was sticking when it wasn't suppose to"   TONSILLECTOMY AND ADENOIDECTOMY  1946   TOTAL KNEE ARTHROPLASTY Left ~ 2008   TOTAL KNEE ARTHROPLASTY  12/20/2011   Procedure: TOTAL KNEE ARTHROPLASTY;  Surgeon: Lorriane Rote, MD;  Location: [facility] OR;  Service: Orthopedics;  Laterality: Right;  Right Total Knee Arthroplasty   TUBAL LIGATION  1980's    reports that she quit smoking about 31 years ago. Her smoking use included cigarettes. She started smoking about 68 years ago. She has a 27.8 pack-year smoking history. She has never been exposed to tobacco smoke. She has never used smokeless tobacco. She reports current alcohol  use of about 7.0 standard drinks of alcohol  per week. She reports that she does not use drugs. Social History   Socioeconomic History   Marital status: Married    Spouse name: Josiah Nigh    Number of children: 3   Years of education: College   Highest education level: Not on file  Occupational History   Occupation: Retired Runner, broadcasting/film/video  Tobacco Use   Smoking status: Former    Current packs/day: 0.00    Average packs/day: 0.8 packs/day for 37.0 years (27.8 ttl pk-yrs)    Types: Cigarettes    Start date: 07/02/1955    Quit date: 07/01/1992    Years since quitting: 31.4    Passive exposure: Never   Smokeless tobacco: Never  Vaping Use   Vaping status: Never Used  Substance and Sexual Activity   Alcohol  use: Yes    Alcohol /week: 7.0 standard drinks of alcohol     Types: 7 Glasses of wine per week    Comment: 09/05/2017  "6 oz wine, 5 days/wk"   Drug use: No    Types: Hydrocodone    Sexual activity: Not Currently  Other Topics Concern   Not on file  Social History Narrative   1-2 cups of coffee a day    Social Drivers of Health   Financial Resource Strain: Low Risk  (04/04/2023)   Overall Financial Resource Strain (CARDIA)    Difficulty of Paying Living Expenses: Not hard at all  Food  Insecurity: No Food Insecurity (11/18/2023)   Hunger Vital Sign    Worried About Running Out of Food in the Last Year: Never true    Ran Out of Food in the Last Year: Never true  Transportation Needs: No Transportation Needs (11/18/2023)   PRAPARE - Administrator, Civil Service (Medical): No    Lack of Transportation (Non-Medical): No  Physical Activity: Insufficiently Active (04/04/2023)   Exercise Vital Sign    Days of Exercise per Week: 2 days    Minutes of Exercise per Session: 20 min  Stress: No Stress Concern Present (04/04/2023)   [employer] of Occupational Health - Occupational Stress Questionnaire    Feeling of Stress : Only a little  Social Connections: Socially Integrated (11/18/2023)   Social Connection and Isolation Panel [NHANES]    Frequency of Communication with Friends and Family: More than three times a week    Frequency of Social Gatherings  with Friends and Family: More than three times a week    Attends Religious Services: 1 to 4 times per year    Active Member of Clubs or Organizations: Yes    Attends Banker Meetings: 1 to 4 times per year    Marital Status: Married  Catering manager Violence: Not At Risk (11/18/2023)   Humiliation, Afraid, Rape, and Kick questionnaire    Fear of Current or Ex-Partner: No    Emotionally Abused: No    Physically Abused: No    Sexually Abused: No    Functional Status Survey:    Family History  Problem Relation Age of Onset   Heart attack Mother    Heart disease Father    Heart failure Father    Bladder Cancer Father    Supraventricular tachycardia Sister    Hypertension Sister     Health Maintenance  Topic Date Due   Pneumonia Vaccine 46+ Years old (1 of 2 - PCV) 11/13/1959   DEXA SCAN  Never done   COVID-19 Vaccine (6 - 2024-25 season) 03/02/2023   INFLUENZA VACCINE  01/30/2024   Medicare Annual Wellness (AWV)  04/03/2024   DTaP/Tdap/Td (2 - Td or Tdap) 03/12/2028   Zoster Vaccines-  Shingrix  Completed   HPV VACCINES  Aged Out   Meningococcal B Vaccine  Aged Out    Allergies  Allergen Reactions   Morphine And Codeine Itching   Aspirin Other (See Comments)    Unknown reaction   Duloxetine  Hcl Other (See Comments)    Unknown reaction   Tymlos [Abaloparatide] Other (See Comments)    Unknown reaction   Ventolin  [Albuterol ] Other (See Comments)    rapid heart beat   Cefadroxil Hives    Patient can take amoxicillin  and cipro     Outpatient Encounter Medications as of 11/27/2023  Medication Sig   acetaminophen  (TYLENOL ) 500 MG tablet Take 500 mg by mouth daily as needed for moderate pain (pain score 4-6).   acetaminophen  (TYLENOL ) 650 MG CR tablet Take 1,300 mg by mouth in the morning and at bedtime.   albuterol  (VENTOLIN  HFA) 108 (90 Base) MCG/ACT inhaler Inhale 2 puffs into the lungs every 4 (four) hours as needed for wheezing or shortness of breath.   alendronate (FOSAMAX) 70 MG tablet Take 70 mg by mouth every Sunday. Take with a full glass of water on an empty stomach.   Ascorbic Acid  (VITAMIN C ) 1000 MG tablet Take 1,000 mg by mouth every morning.   cetirizine  (ZYRTEC ) 5 MG chewable tablet Chew 5 mg by mouth daily as needed for allergies.   Cholecalciferol  (VITAMIN D3) 50 MCG (2000 UT) TABS Take 2,000 Units by mouth every morning.   cyanocobalamin  (VITAMIN B12) 1000 MCG tablet Take 1,000 mcg by mouth daily.   docusate sodium  (COLACE) 100 MG capsule Take 200 mg by mouth at bedtime.   Dorzolamide  HCl-Timolol  Mal PF 2-0.5 % SOLN Place 1 drop into both eyes in the morning and at bedtime.   ferrous gluconate  (FERGON) 240 (27 FE) MG tablet Take 240 mg by mouth every morning. Take without food   fluticasone  (FLONASE ) 50 MCG/ACT nasal spray Place 1 spray into both nostrils daily as needed for allergies or rhinitis.   guaiFENesin  (ROBITUSSIN) 100 MG/5ML liquid Take 10 mLs by mouth every 4 (four) hours as needed for cough or to loosen phlegm.   latanoprost  (XALATAN ) 0.005  % ophthalmic solution Place 1 drop into both eyes at bedtime.   Multiple Vitamins-Minerals (PRESERVISION AREDS 2)  CAPS Take 1 capsule by mouth 2 (two) times daily.   Netarsudil  Dimesylate (RHOPRESSA ) 0.02 % SOLN Place 1 drop into both eyes at bedtime.   oxybutynin  (DITROPAN -XL) 10 MG 24 hr tablet Take 10 mg by mouth at bedtime.   pantoprazole  (PROTONIX ) 40 MG tablet Take 1 tablet (40 mg total) by mouth 2 (two) times daily before a meal.   polyethylene glycol (MIRALAX  / GLYCOLAX ) 17 g packet Take 8.5 g by mouth daily as needed (constipation).   potassium chloride  SA (KLOR-CON  M) 20 MEQ tablet Take 1 tablet (20 mEq total) by mouth daily.   Probiotic Product (ALIGN) 4 MG CAPS Take 4 mg by mouth at bedtime.   rOPINIRole  (REQUIP ) 0.5 MG tablet Take 1 tablet (0.5 mg total) by mouth daily as needed.   sertraline  (ZOLOFT ) 50 MG tablet Take 1.5 tablets (75 mg total) by mouth daily.   simvastatin  (ZOCOR ) 20 MG tablet Take 20 mg by mouth every evening.   Sodium Fluoride  (PREVIDENT 5000 BOOSTER PLUS) 1.1 % PSTE Place 1 Application onto teeth See admin instructions. Brush on teeth with a toothbrush after evening mouth care. Spit out excess and do not rinse.   torsemide  (DEMADEX ) 20 MG tablet Take 20 mg by mouth daily.   umeclidinium-vilanterol (ANORO ELLIPTA ) 62.5-25 MCG/ACT AEPB Inhale 1 puff into the lungs every morning.   zinc oxide 20 % ointment Apply 1 Application topically See admin instructions. Apply topically to buttocks after every incontinent episode and as needed for redness   No facility-administered encounter medications on file as of 11/27/2023.    Review of Systems  Constitutional:  Negative for chills and fever.  HENT:  Negative for sinus pressure and sore throat.   Respiratory:  Negative for cough, shortness of breath and wheezing.   Cardiovascular:  Negative for chest pain, palpitations and leg swelling.  Gastrointestinal:  Positive for constipation. Negative for abdominal distention,  abdominal pain, blood in stool, diarrhea, nausea and vomiting.  Genitourinary:  Negative for dysuria, frequency and urgency.  Neurological:  Negative for dizziness.  Psychiatric/Behavioral:  Negative for confusion.     There were no vitals filed for this visit. There is no height or weight on file to calculate BMI. BP Readings from Last 3 Encounters:  11/26/23 114/60  11/17/23 111/62  10/27/23 (!) 105/58   Wt Readings from Last 3 Encounters:  11/18/23 170 lb 10.2 oz (77.4 kg)  11/17/23 170 lb 3.2 oz (77.2 kg)  10/27/23 170 lb (77.1 kg)   Physical Exam Constitutional:      Appearance: Normal appearance.  HENT:     Head: Normocephalic and atraumatic.  Cardiovascular:     Rate and Rhythm: Normal rate and regular rhythm.  Pulmonary:     Effort: Pulmonary effort is normal. No respiratory distress.     Breath sounds: Normal breath sounds. No wheezing.  Abdominal:     General: Bowel sounds are normal. There is no distension.     Tenderness: There is no abdominal tenderness. There is no guarding or rebound.     Comments:    Musculoskeletal:        General: No swelling.  Neurological:     Mental Status: She is alert. Mental status is at baseline.     Motor: No weakness.     Labs reviewed: Basic Metabolic Panel: Recent Labs    11/18/23 1025 11/19/23 0302 11/19/23 1610 11/20/23 0254 11/21/23 0310 11/23/23 0650 11/24/23 0541 11/25/23 0508  NA 138   < >  --  141   < > 139 136 134*  K 3.0*   < >  --  3.8   < > 3.6 3.7 3.6  CL 105   < >  --  112*   < > 107 103 105  CO2 25   < >  --  23   < > 24 24 25   GLUCOSE 148*   < >  --  130*   < > 114* 120* 120*  BUN 25*   < >  --  13   < > 11 12 9   CREATININE 0.57   < >  --  <0.30*   < > 0.47 0.48 0.33*  CALCIUM  7.4*   < >  --  7.9*   < > 8.4* 8.3* 7.9*  MG 1.9  --   --  2.0   < > 2.2 2.3 2.2  PHOS 2.6  --  2.2* 1.8*  --   --   --   --    < > = values in this interval not displayed.   Liver Function Tests: Recent Labs     10/24/23 2107 11/18/23 0210 11/19/23 0302  AST 20 15 14*  ALT 10 10 8   ALKPHOS 66 40 29*  BILITOT 0.6 0.7 0.8  PROT 6.6 5.5* 4.5*  ALBUMIN 3.7 3.3* 2.6*   No results for input(s): "LIPASE", "AMYLASE" in the last 8760 hours. No results for input(s): "AMMONIA" in the last 8760 hours. CBC: Recent Labs    04/26/23 1025 04/27/23 0629 07/14/23 0000 10/24/23 2107 11/18/23 0210 11/18/23 1025 11/23/23 0650 11/24/23 0541 11/25/23 0508  WBC 5.7   < > 4.6   < > 10.3   < > 6.5 6.8 5.5  NEUTROABS 3.7  --  2,640.00  --  6.9  --   --   --   --   HGB 8.8*   < > 11.7*   < > 5.3*   < > 8.3* 8.2* 8.1*  HCT 28.8*   < > 37   < > 17.0*   < > 27.8* 26.9* 26.9*  MCV 99.3   < >  --    < > 103.7*   < > 104.1* 102.7* 104.7*  PLT 196   < > 156   < > 179   < > 178 189 196   < > = values in this interval not displayed.   Cardiac Enzymes: No results for input(s): "CKTOTAL", "CKMB", "CKMBINDEX", "TROPONINI" in the last 8760 hours. BNP: Invalid input(s): "POCBNP" No results found for: "HGBA1C" Lab Results  Component Value Date   TSH 2.47 10/08/2022   Lab Results  Component Value Date   VITAMINB12 373 07/14/2023   Lab Results  Component Value Date   FOLATE 19.6 05/06/2022   Lab Results  Component Value Date   IRON  70 02/07/2023   TIBC 326 02/07/2023   FERRITIN 44 02/07/2023    Imaging and Procedures obtained prior to SNF admission: No results found.  Assessment and Plan Assessment & Plan  Lower GI bleed Pt denies no further episodes since admission to skilled  Will check cbc tomorrow Will monitor for signs of bleeding Follow-up with GI. Continue with PT/OT  Constipation  Cont with bowel regimen     COPD No wheezing on auscultation Continue with Anoro Ellipta   Iron  deficiency anemia Continue with iron  supplements  Urinary incontinence Continue with oxybutynin   GERD Continue with pantoprazole   Osteoarthritis Continue Tylenol  as needed   Depression Continue with  the Zoloft     35 MINTotal time spent for obtaining history,  performing a medically appropriate examination and evaluation, reviewing the tests, documenting clinical information in the electronic or other health record,  care coordination (not separately reported)

## 2023-12-01 ENCOUNTER — Encounter: Payer: Self-pay | Admitting: Sports Medicine

## 2023-12-05 ENCOUNTER — Encounter: Payer: Self-pay | Admitting: Nurse Practitioner

## 2023-12-05 ENCOUNTER — Non-Acute Institutional Stay (SKILLED_NURSING_FACILITY): Payer: Self-pay | Admitting: Nurse Practitioner

## 2023-12-05 DIAGNOSIS — E785 Hyperlipidemia, unspecified: Secondary | ICD-10-CM

## 2023-12-05 DIAGNOSIS — R32 Unspecified urinary incontinence: Secondary | ICD-10-CM | POA: Insufficient documentation

## 2023-12-05 DIAGNOSIS — M533 Sacrococcygeal disorders, not elsewhere classified: Secondary | ICD-10-CM | POA: Diagnosis not present

## 2023-12-05 DIAGNOSIS — G2581 Restless legs syndrome: Secondary | ICD-10-CM

## 2023-12-05 DIAGNOSIS — I5032 Chronic diastolic (congestive) heart failure: Secondary | ICD-10-CM | POA: Diagnosis not present

## 2023-12-05 DIAGNOSIS — M1612 Unilateral primary osteoarthritis, left hip: Secondary | ICD-10-CM | POA: Diagnosis not present

## 2023-12-05 DIAGNOSIS — K922 Gastrointestinal hemorrhage, unspecified: Secondary | ICD-10-CM

## 2023-12-05 DIAGNOSIS — J418 Mixed simple and mucopurulent chronic bronchitis: Secondary | ICD-10-CM

## 2023-12-05 DIAGNOSIS — D509 Iron deficiency anemia, unspecified: Secondary | ICD-10-CM

## 2023-12-05 DIAGNOSIS — M4856XA Collapsed vertebra, not elsewhere classified, lumbar region, initial encounter for fracture: Secondary | ICD-10-CM | POA: Diagnosis not present

## 2023-12-05 DIAGNOSIS — I10 Essential (primary) hypertension: Secondary | ICD-10-CM

## 2023-12-05 DIAGNOSIS — M150 Primary generalized (osteo)arthritis: Secondary | ICD-10-CM

## 2023-12-05 DIAGNOSIS — F339 Major depressive disorder, recurrent, unspecified: Secondary | ICD-10-CM

## 2023-12-05 DIAGNOSIS — M47816 Spondylosis without myelopathy or radiculopathy, lumbar region: Secondary | ICD-10-CM | POA: Diagnosis not present

## 2023-12-05 DIAGNOSIS — M79652 Pain in left thigh: Secondary | ICD-10-CM | POA: Diagnosis not present

## 2023-12-05 NOTE — Progress Notes (Signed)
 Location:   SNF [facility] Room Number: 52 Place of Service:  SNF (31) Provider: [facility] Aaron Bostwick NP  Arnetha Bhat, NP  Patient Care Team: Arnetha Bhat, NP as PCP - General (Adult Health Nurse Practitioner) Marguerite Shiley, MD as Consulting Physician (Internal Medicine)  Extended Emergency Contact Information Primary Emergency Contact: Umphlett,[name] Address: [address]          [place], [place] [address] [place] Home Phone: [phone] Mobile Phone: [phone] Relation: Spouse Secondary Emergency Contact: Tetrault MD,[name] Tracey Friday Phone: [phone] Relation: Son Interpreter needed? No  Code Status:  DNR Goals of care: Advanced Directive information    11/18/2023    1:56 AM  Advanced Directives  Does Patient Have a Medical Advance Directive? Yes  Type of Advance Directive Out of facility DNR (pink MOST or yellow form)  Does patient want to make changes to medical advance directive? No - Patient declined  Pre-existing out of facility DNR order (yellow form or pink MOST form) Physician notified to receive inpatient order     Chief Complaint  Patient presents with   Medical Management of Chronic Issues    HPI:  Pt is a 83 y.o. female seen today for medical management of chronic diseases. Hospitalized for lower GI bleed, hx of diverticulosis with bleeding, Hx of multiple GI bleed, 09/2023 Colonoscopy/ EGD showed gastritis and ischemic colitis,  transfused due to Hgb 5.3.  No active bleeding since [facility] admission.   GERD, takes Pantoprazole  bid. Followed by GI             Anemia, Hx of GI bleed, transfused,  takes Iron , Hgb 8.8 12/02/23>>8.4 11/28/23             COPD O2 at night, takes Ellipta, Claritin , prn Albuterol              Pulmonary nodule pretracheal lymph node 1.3x1.2 cm CT chest 08/09/21             HTN takes Torsemide ,  Bun/creat 12/0.51 12/02/23             HLD takes Zocor              Anxiety/depression, takes Sertraline              OP  takes Fosamax, Vit D             OA multiple sites,chronic right shoulder/R arm pain(rotator cuff injury), takes Tylenol , Hx of Norco use but the patient stated she is allergic to codeine and Morphine. C/o left groin to thigh to knee to ankle pain with therapy and RLE abduction and straight leg raise, able to walk w/o pain.              RLS, takes Requip   Urinary incontinence, on Oxybutynin   CHF mild edema BLE, taking Torsemide    Past Medical History:  Diagnosis Date   Anxiety    Arthritis    "bilateral knees, shoulders, elbows; neck, pretty widespread" (09/05/2017)   BPPV (benign paroxysmal positional vertigo)    Depression    GERD (gastroesophageal reflux disease)    Glaucoma, both eyes    Headache    "probably 2/month" (09/05/2017)   History of blood transfusion ~ 2008   "related to LGIB"   Hyperlipemia    Lower GI bleeding ~ 2008; 09/05/2017   "had to have blood transfusion"   Macular degeneration, bilateral    Osteopenia    Seasonal allergies    Skin cancer, basal  cell 2001   "off my nose, left side"   Sleeping excessive    Tinnitus of both ears    Past Surgical History:  Procedure Laterality Date   BALLOON DILATION N/A 05/08/2021   Procedure: BALLOON DILATION;  Surgeon: Genell Ken, MD;  Location: [facility] ENDOSCOPY;  Service: Gastroenterology;  Laterality: N/A;   BASAL CELL CARCINOMA EXCISION  2001   "off my nose, left side"   BIOPSY  05/08/2021   Procedure: BIOPSY;  Surgeon: Genell Ken, MD;  Location: [facility] ENDOSCOPY;  Service: Gastroenterology;;   BIOPSY  04/25/2023   Procedure: BIOPSY;  Surgeon: Baldo Bonds, MD;  Location: [facility] ENDOSCOPY;  Service: Gastroenterology;;   BLEPHAROPLASTY Bilateral    BONE BIOPSY  10/26/2023   Procedure: BIOPSY, GI;  Surgeon: Felecia Hopper, MD;  Location: [facility] ENDOSCOPY;  Service: Gastroenterology;;   CATARACT EXTRACTION W/ INTRAOCULAR LENS  IMPLANT, BILATERAL Bilateral 1990's   COLONOSCOPY N/A 10/26/2023   Procedure: COLONOSCOPY;  Surgeon:  Felecia Hopper, MD;  Location: [facility] ENDOSCOPY;  Service: Gastroenterology;  Laterality: N/A;   COLONOSCOPY WITH PROPOFOL  N/A 05/04/2022   Procedure: COLONOSCOPY WITH PROPOFOL ;  Surgeon: Genell Ken, MD;  Location: [facility] ENDOSCOPY;  Service: Gastroenterology;  Laterality: N/A;   COLONOSCOPY WITH PROPOFOL  N/A 04/25/2023   Procedure: COLONOSCOPY WITH PROPOFOL ;  Surgeon: Baldo Bonds, MD;  Location: [facility] ENDOSCOPY;  Service: Gastroenterology;  Laterality: N/A;   ENTEROSCOPY N/A 11/20/2023   Procedure: ENTEROSCOPY;  Surgeon: Baldo Bonds, MD;  Location: [facility] ENDOSCOPY;  Service: Gastroenterology;  Laterality: N/A;   ESOPHAGOGASTRODUODENOSCOPY N/A 10/26/2023   Procedure: EGD (ESOPHAGOGASTRODUODENOSCOPY);  Surgeon: Felecia Hopper, MD;  Location: [facility] ENDOSCOPY;  Service: Gastroenterology;  Laterality: N/A;   ESOPHAGOGASTRODUODENOSCOPY N/A 11/20/2023   Procedure: EGD (ESOPHAGOGASTRODUODENOSCOPY);  Surgeon: Baldo Bonds, MD;  Location: [facility] ENDOSCOPY;  Service: Gastroenterology;  Laterality: N/A;   ESOPHAGOGASTRODUODENOSCOPY (EGD) WITH PROPOFOL  N/A 05/08/2021   Procedure: ESOPHAGOGASTRODUODENOSCOPY (EGD) WITH PROPOFOL ;  Surgeon: Genell Ken, MD;  Location: [facility] ENDOSCOPY;  Service: Gastroenterology;  Laterality: N/A;   ESOPHAGOGASTRODUODENOSCOPY (EGD) WITH PROPOFOL  N/A 01/11/2022   Procedure: ESOPHAGOGASTRODUODENOSCOPY (EGD) WITH PROPOFOL ;  Surgeon: Ozell Blunt, MD;  Location: [facility] ENDOSCOPY;  Service: Gastroenterology;  Laterality: N/A;   ESOPHAGOGASTRODUODENOSCOPY (EGD) WITH PROPOFOL  N/A 01/06/2023   Procedure: ESOPHAGOGASTRODUODENOSCOPY (EGD) WITH PROPOFOL ;  Surgeon: Ozell Blunt, MD;  Location: [facility] ENDOSCOPY;  Service: Gastroenterology;  Laterality: N/A;   ESOPHAGOGASTRODUODENOSCOPY (EGD) WITH PROPOFOL  N/A 04/21/2023   Procedure: ESOPHAGOGASTRODUODENOSCOPY (EGD) WITH PROPOFOL ;  Surgeon: Baldo Bonds, MD;  Location: [facility] ENDOSCOPY;  Service: Gastroenterology;  Laterality: N/A;   EYE SURGERY Bilateral     "to improve vision after cataract OR"   GIVENS CAPSULE STUDY N/A 04/21/2023   Procedure: GIVENS CAPSULE STUDY;  Surgeon: Baldo Bonds, MD;  Location: [facility] ENDOSCOPY;  Service: Gastroenterology;  Laterality: N/A;   GIVENS CAPSULE STUDY N/A 11/18/2023   Procedure: IMAGING PROCEDURE, GI TRACT, INTRALUMINAL, VIA CAPSULE;  Surgeon: Baldo Bonds, MD;  Location: [facility] ENDOSCOPY;  Service: Gastroenterology;  Laterality: N/A;   HEMOSTASIS CLIP PLACEMENT  10/26/2023   Procedure: CONTROL OF HEMORRHAGE, GI TRACT, ENDOSCOPIC, BY CLIPPING OR OVERSEWING;  Surgeon: Felecia Hopper, MD;  Location: [facility] ENDOSCOPY;  Service: Gastroenterology;;   IR ANGIOGRAM FOLLOW UP STUDY  12/19/2020   IR ANGIOGRAM SELECTIVE EACH ADDITIONAL VESSEL  12/19/2020   IR ANGIOGRAM SELECTIVE EACH ADDITIONAL VESSEL  12/19/2020   IR ANGIOGRAM VISCERAL SELECTIVE  12/19/2020   IR EMBO ART  VEN HEMORR LYMPH EXTRAV  INC GUIDE ROADMAPPING  12/19/2020   IR US  GUIDE VASC ACCESS RIGHT  12/19/2020  JOINT REPLACEMENT     POLYPECTOMY  05/04/2022   Procedure: POLYPECTOMY;  Surgeon: Genell Ken, MD;  Location: [facility] ENDOSCOPY;  Service: Gastroenterology;;   POLYPECTOMY  10/26/2023   Procedure: POLYPECTOMY, INTESTINE;  Surgeon: Felecia Hopper, MD;  Location: [facility] ENDOSCOPY;  Service: Gastroenterology;;   STAPEDES SURGERY Left    "scraped stapedes because it was sticking when it wasn't suppose to"   TONSILLECTOMY AND ADENOIDECTOMY  1946   TOTAL KNEE ARTHROPLASTY Left ~ 2008   TOTAL KNEE ARTHROPLASTY  12/20/2011   Procedure: TOTAL KNEE ARTHROPLASTY;  Surgeon: Lorriane Rote, MD;  Location: [facility] OR;  Service: Orthopedics;  Laterality: Right;  Right Total Knee Arthroplasty   TUBAL LIGATION  1980's    Allergies  Allergen Reactions   Morphine And Codeine Itching   Aspirin Other (See Comments)    Unknown reaction   Duloxetine  Hcl Other (See Comments)    Unknown reaction   Tymlos [Abaloparatide] Other (See Comments)    Unknown reaction   Ventolin   [Albuterol ] Other (See Comments)    rapid heart beat   Cefadroxil Hives    Patient can take amoxicillin  and cipro     Allergies as of 12/05/2023       Reactions   Morphine And Codeine Itching   Aspirin Other (See Comments)   Unknown reaction   Duloxetine  Hcl Other (See Comments)   Unknown reaction   Tymlos [abaloparatide] Other (See Comments)   Unknown reaction   Ventolin  [albuterol ] Other (See Comments)   rapid heart beat   Cefadroxil Hives   Patient can take amoxicillin  and cipro         Medication List        Accurate as of December 05, 2023  1:11 PM. If you have any questions, ask your nurse or doctor.          acetaminophen  650 MG CR tablet Commonly known as: TYLENOL  Take 1,300 mg by mouth in the morning and at bedtime.   acetaminophen  500 MG tablet Commonly known as: TYLENOL  Take 500 mg by mouth daily as needed for moderate pain (pain score 4-6).   albuterol  108 (90 Base) MCG/ACT inhaler Commonly known as: VENTOLIN  HFA Inhale 2 puffs into the lungs every 4 (four) hours as needed for wheezing or shortness of breath.   alendronate 70 MG tablet Commonly known as: FOSAMAX Take 70 mg by mouth every Sunday. Take with a full glass of water on an empty stomach.   Align 4 MG Caps Take 4 mg by mouth at bedtime.   Anoro Ellipta  62.5-25 MCG/ACT Aepb Generic drug: umeclidinium-vilanterol Inhale 1 puff into the lungs every morning.   cetirizine  5 MG chewable tablet Commonly known as: ZYRTEC  Chew 5 mg by mouth daily as needed for allergies.   cyanocobalamin  1000 MCG tablet Commonly known as: VITAMIN B12 Take 1,000 mcg by mouth daily.   docusate sodium  100 MG capsule Commonly known as: COLACE Take 200 mg by mouth at bedtime.   Dorzolamide  HCl-Timolol  Mal PF 2-0.5 % Soln Place 1 drop into both eyes in the morning and at bedtime.   ferrous gluconate  240 (27 FE) MG tablet Commonly known as: FERGON Take 240 mg by mouth every morning. Take without food    fluticasone  50 MCG/ACT nasal spray Commonly known as: FLONASE  Place 1 spray into both nostrils daily as needed for allergies or rhinitis.   guaiFENesin  100 MG/5ML liquid Commonly known as: ROBITUSSIN Take 10 mLs by mouth every 4 (four) hours as needed for cough or to  loosen phlegm.   latanoprost  0.005 % ophthalmic solution Commonly known as: XALATAN  Place 1 drop into both eyes at bedtime.   oxybutynin  10 MG 24 hr tablet Commonly known as: DITROPAN -XL Take 10 mg by mouth at bedtime.   pantoprazole  40 MG tablet Commonly known as: PROTONIX  Take 1 tablet (40 mg total) by mouth 2 (two) times daily before a meal.   polyethylene glycol 17 g packet Commonly known as: MIRALAX  / GLYCOLAX  Take 8.5 g by mouth daily as needed (constipation).   potassium chloride  SA 20 MEQ tablet Commonly known as: KLOR-CON  M Take 1 tablet (20 mEq total) by mouth daily.   PreserVision AREDS 2 Caps Take 1 capsule by mouth 2 (two) times daily.   PreviDent 5000 Booster Plus 1.1 % Pste Generic drug: Sodium Fluoride  Place 1 Application onto teeth See admin instructions. Brush on teeth with a toothbrush after evening mouth care. Spit out excess and do not rinse.   Rhopressa  0.02 % Soln Generic drug: Netarsudil  Dimesylate Place 1 drop into both eyes at bedtime.   rOPINIRole  0.5 MG tablet Commonly known as: REQUIP  Take 1 tablet (0.5 mg total) by mouth daily as needed.   sertraline  50 MG tablet Commonly known as: ZOLOFT  Take 1.5 tablets (75 mg total) by mouth daily.   simvastatin  20 MG tablet Commonly known as: ZOCOR  Take 20 mg by mouth every evening.   torsemide  20 MG tablet Commonly known as: DEMADEX  Take 20 mg by mouth daily.   vitamin C  1000 MG tablet Take 1,000 mg by mouth every morning.   Vitamin D3 50 MCG (2000 UT) Tabs Take 2,000 Units by mouth every morning.   zinc oxide 20 % ointment Apply 1 Application topically See admin instructions. Apply topically to buttocks after every  incontinent episode and as needed for redness        Review of Systems  Constitutional:  Negative for activity change, fatigue and fever.  HENT:  Positive for hearing loss. Negative for congestion and trouble swallowing.   Eyes:  Negative for visual disturbance.  Respiratory:  Negative for cough, shortness of breath and wheezing.   Cardiovascular:  Positive for leg swelling.  Gastrointestinal:  Negative for abdominal pain, blood in stool and constipation.  Genitourinary:  Positive for frequency. Negative for difficulty urinating, dysuria, flank pain and urgency.  Musculoskeletal:  Positive for arthralgias and gait problem.  Skin:  Negative for color change.  Neurological:  Negative for tremors and headaches.  Psychiatric/Behavioral:  Negative for behavioral problems and sleep disturbance. The patient is not nervous/anxious.     Immunization History  Administered Date(s) Administered   Dtap, Unspecified 03/12/2018   Fluad Quad(high Dose 65+) 04/24/2022   Fluad Trivalent(High Dose 65+) 04/27/2023   Fluzone Influenza virus vaccine,trivalent (IIV3), split virus 09/22/2009, 03/29/2010, 07/23/2011, 04/03/2012, 03/30/2014   Influenza Inj Mdck Quad Pf 04/12/2017   Influenza, High Dose Seasonal PF 04/15/2013, 03/14/2018   Influenza, Mdck, Trivalent,PF 6+ MOS(egg free) 03/16/2016, 04/12/2017   Influenza, Quadrivalent, Recombinant, Inj, Pf 03/05/2019, 05/22/2021   Influenza,inj,Quad PF,6+ Mos 03/30/2014, 03/25/2015   PFIZER(Purple Top)SARS-COV-2 Vaccination 07/25/2019, 08/16/2019, 03/31/2020, 03/31/2021, 12/12/2021   Pneumococcal Conjugate-13 10/19/2013, 11/01/2014   Pneumococcal Polysaccharide-23 09/22/2009, 05/01/2010, 07/23/2011   Pneumococcal-Unspecified 03/02/2011   Td (Adult),5 Lf Tetanus Toxid, Preservative Free 07/23/2011   Tdap 09/22/2009, 07/23/2011, 03/12/2018   Zoster Recombinant(Shingrix) 10/08/2017, 12/09/2017   Zoster, Live 09/22/2009, 07/23/2011, 12/16/2017   Pertinent   Health Maintenance Due  Topic Date Due   DEXA SCAN  Never done  INFLUENZA VACCINE  01/30/2024      10/30/2022   12:44 PM 01/22/2023    1:15 PM 04/04/2023   11:00 AM 07/10/2023    9:02 AM 10/24/2023    2:27 PM  Fall Risk  Falls in the past year? 0 0 0 0 0  Was there an injury with Fall? 0 0 0 0 0  Fall Risk Category Calculator 0 0 0 0 0  Patient at Risk for Falls Due to History of fall(s);Impaired balance/gait;Impaired mobility History of fall(s);Impaired balance/gait History of fall(s);Impaired balance/gait;Impaired mobility History of fall(s);Impaired balance/gait;Impaired mobility History of fall(s);Impaired balance/gait;Impaired vision  Fall risk Follow up Falls evaluation completed;Education provided;Falls prevention discussed Falls evaluation completed;Education provided;Falls prevention discussed Falls evaluation completed;Education provided;Falls prevention discussed Falls evaluation completed;Education provided;Falls prevention discussed Falls evaluation completed;Education provided   Functional Status Survey:    Vitals:   12/05/23 1235  BP: 138/67  Pulse: 94  Resp: 18  Temp: (!) 97.1 F (36.2 C)  SpO2: 95%  Weight: 161 lb 9.6 oz (73.3 kg)   Body mass index is 30.53 kg/m. Physical Exam Nursing note reviewed.  Constitutional:      Appearance: Normal appearance.  HENT:     Head: Normocephalic and atraumatic.     Nose: Nose normal.  Eyes:     Extraocular Movements: Extraocular movements intact.     Conjunctiva/sclera: Conjunctivae normal.     Pupils: Pupils are equal, round, and reactive to light.  Cardiovascular:     Rate and Rhythm: Normal rate and regular rhythm.     Heart sounds: No murmur heard. Pulmonary:     Effort: Pulmonary effort is normal.  Abdominal:     General: Bowel sounds are normal. There is no distension.     Palpations: Abdomen is soft.     Tenderness: There is no abdominal tenderness. There is no right CVA tenderness, left CVA tenderness,  guarding or rebound.  Musculoskeletal:        General: Tenderness present. No swelling, deformity or signs of injury. Normal range of motion.     Cervical back: Normal range of motion and neck supple.     Right lower leg: Edema present.     Left lower leg: Edema present.     Comments: Trace edema BLE Chronic R shoulder/arm pain, full ROM, hx of rotator cuff injury Left groin to thigh to ankle pain with therapy, abduction and straight leg raise, able to weight bearing and walking  Skin:    General: Skin is warm and dry.  Neurological:     General: No focal deficit present.     Mental Status: She is alert and oriented to person, place, and time. Mental status is at baseline.     Gait: Gait abnormal.  Psychiatric:        Mood and Affect: Mood normal.        Behavior: Behavior normal.        Thought Content: Thought content normal.        Judgment: Judgment normal.     Labs reviewed: Recent Labs    11/18/23 1025 11/19/23 0302 11/19/23 0904 11/20/23 0254 11/21/23 0310 11/23/23 0650 11/24/23 0541 11/25/23 0508  NA 138   < >  --  141   < > 139 136 134*  K 3.0*   < >  --  3.8   < > 3.6 3.7 3.6  CL 105   < >  --  112*   < > 107 103 105  CO2  25   < >  --  23   < > 24 24 25   GLUCOSE 148*   < >  --  130*   < > 114* 120* 120*  BUN 25*   < >  --  13   < > 11 12 9   CREATININE 0.57   < >  --  <0.30*   < > 0.47 0.48 0.33*  CALCIUM  7.4*   < >  --  7.9*   < > 8.4* 8.3* 7.9*  MG 1.9  --   --  2.0   < > 2.2 2.3 2.2  PHOS 2.6  --  2.2* 1.8*  --   --   --   --    < > = values in this interval not displayed.   Recent Labs    10/24/23 2107 11/18/23 0210 11/19/23 0302  AST 20 15 14*  ALT 10 10 8   ALKPHOS 66 40 29*  BILITOT 0.6 0.7 0.8  PROT 6.6 5.5* 4.5*  ALBUMIN 3.7 3.3* 2.6*   Recent Labs    04/26/23 1025 04/27/23 0629 07/14/23 0000 10/24/23 2107 11/18/23 0210 11/18/23 1025 11/23/23 0650 11/24/23 0541 11/25/23 0508  WBC 5.7   < > 4.6   < > 10.3   < > 6.5 6.8 5.5   NEUTROABS 3.7  --  2,640.00  --  6.9  --   --   --   --   HGB 8.8*   < > 11.7*   < > 5.3*   < > 8.3* 8.2* 8.1*  HCT 28.8*   < > 37   < > 17.0*   < > 27.8* 26.9* 26.9*  MCV 99.3   < >  --    < > 103.7*   < > 104.1* 102.7* 104.7*  PLT 196   < > 156   < > 179   < > 178 189 196   < > = values in this interval not displayed.   Lab Results  Component Value Date   TSH 2.47 10/08/2022   No results found for: "HGBA1C" Lab Results  Component Value Date   CHOL 125 07/14/2023   HDL 51 07/14/2023   LDLCALC 53 07/14/2023   TRIG 130 07/14/2023    Significant Diagnostic Results in last 30 days:  No results found.  Assessment/Plan  Osteoarthritis, multiple sites  multiple sites,chronic right shoulder/R arm pain(rotator cuff injury), takes Tylenol , Hx of Norco use but the patient stated she is allergic to codeine and Morphine. C/o left groin to thigh to knee to ankle pain with therapy and RLE abduction and straight leg raise, able to walk w/o pain.  Will try Tramadol 25mg  bid po with food X-ray lumbar spine, sacrum, pelvis, left hip/femur.   Iron  deficiency anemia Hx of GI bleed, transfused,  takes Iron , Hgb 8.8 12/02/23>>8.4 11/28/23  Lower GI bleed No s/s of GI bleed since admission to SNF [facility] Recent hospitalization for lower GI bleed, Hx of diverticulosis with bleeding, Hx of multiple GI bleed, 09/2023 Colonoscopy/ EGD showed gastritis and ischemic colitis,  transfused due to Hgb 5.3.   COPD (chronic obstructive pulmonary disease) (HCC) O2 at night, takes Ellipta, Claritin , prn Albuterol   Hypertension Blood pressure is controlled, takes Torsemide , Bun/creat 12/0.51 12/02/23  Hyperlipidemia takes Propranolol , Bun/creat 12/0.51 12/02/23  Recurrent depression (HCC) Stable, sleeps well, takes Sertraline   Restless leg syndrome  takes Requip   Urinary incontinence  on Oxybutynin   Diastolic heart failure (HCC)  mild edema BLE, taking  Torsemide    Family/ staff Communication: plan of care  reviewed with the patient and charge nurse.   Labs/tests ordered:  X-ray lumbar spine, sacrum, pelvis, left hip/femur.

## 2023-12-05 NOTE — Assessment & Plan Note (Signed)
 Stable, sleeps well, takes Sertraline 

## 2023-12-05 NOTE — Assessment & Plan Note (Signed)
 O2 at night, takes Ellipta, Claritin , prn Albuterol 

## 2023-12-05 NOTE — Assessment & Plan Note (Signed)
 Hx of GI bleed, transfused,  takes Iron , Hgb 8.8 12/02/23>>8.4 11/28/23

## 2023-12-05 NOTE — Assessment & Plan Note (Signed)
 mild edema BLE, taking Torsemide 

## 2023-12-05 NOTE — Assessment & Plan Note (Signed)
 No s/s of GI bleed since admission to SNF [facility] Recent hospitalization for lower GI bleed, Hx of diverticulosis with bleeding, Hx of multiple GI bleed, 09/2023 Colonoscopy/ EGD showed gastritis and ischemic colitis,  transfused due to Hgb 5.3.

## 2023-12-05 NOTE — Assessment & Plan Note (Signed)
 takes Propranolol , Bun/creat 12/0.51 12/02/23

## 2023-12-05 NOTE — Assessment & Plan Note (Signed)
takes Requip

## 2023-12-05 NOTE — Assessment & Plan Note (Signed)
 Blood pressure is controlled, takes Torsemide , Bun/creat 12/0.51 12/02/23

## 2023-12-05 NOTE — Assessment & Plan Note (Signed)
 multiple sites,chronic right shoulder/R arm pain(rotator cuff injury), takes Tylenol , Hx of Norco use but the patient stated she is allergic to codeine and Morphine. C/o left groin to thigh to knee to ankle pain with therapy and RLE abduction and straight leg raise, able to walk w/o pain.  Will try Tramadol 25mg  bid po with food X-ray lumbar spine, sacrum, pelvis, left hip/femur.

## 2023-12-05 NOTE — Assessment & Plan Note (Signed)
 on Oxybutynin 

## 2023-12-08 ENCOUNTER — Non-Acute Institutional Stay (SKILLED_NURSING_FACILITY): Payer: Self-pay | Admitting: Nurse Practitioner

## 2023-12-08 ENCOUNTER — Encounter: Payer: Self-pay | Admitting: Nurse Practitioner

## 2023-12-08 DIAGNOSIS — K219 Gastro-esophageal reflux disease without esophagitis: Secondary | ICD-10-CM | POA: Diagnosis not present

## 2023-12-08 DIAGNOSIS — J418 Mixed simple and mucopurulent chronic bronchitis: Secondary | ICD-10-CM

## 2023-12-08 DIAGNOSIS — M810 Age-related osteoporosis without current pathological fracture: Secondary | ICD-10-CM | POA: Diagnosis not present

## 2023-12-08 DIAGNOSIS — M150 Primary generalized (osteo)arthritis: Secondary | ICD-10-CM | POA: Diagnosis not present

## 2023-12-08 DIAGNOSIS — I10 Essential (primary) hypertension: Secondary | ICD-10-CM

## 2023-12-08 DIAGNOSIS — E785 Hyperlipidemia, unspecified: Secondary | ICD-10-CM

## 2023-12-08 DIAGNOSIS — S32010A Wedge compression fracture of first lumbar vertebra, initial encounter for closed fracture: Secondary | ICD-10-CM

## 2023-12-08 DIAGNOSIS — D509 Iron deficiency anemia, unspecified: Secondary | ICD-10-CM

## 2023-12-08 DIAGNOSIS — F339 Major depressive disorder, recurrent, unspecified: Secondary | ICD-10-CM

## 2023-12-08 DIAGNOSIS — I5032 Chronic diastolic (congestive) heart failure: Secondary | ICD-10-CM | POA: Diagnosis not present

## 2023-12-08 DIAGNOSIS — N3946 Mixed incontinence: Secondary | ICD-10-CM | POA: Diagnosis not present

## 2023-12-08 DIAGNOSIS — G2581 Restless legs syndrome: Secondary | ICD-10-CM

## 2023-12-08 NOTE — Progress Notes (Unsigned)
 Location:   SNF [facility] Room Number: 68 Place of Service:  SNF (31)  Provider: Kerman Peck NP  PCP: Arnetha Bhat, NP Patient Care Team: Arnetha Bhat, NP as PCP - General (Adult Health Nurse Practitioner) Marguerite Shiley, MD as Consulting Physician (Internal Medicine)  Extended Emergency Contact Information Primary Emergency Contact: Hillmer,[name] Address: [address]          [place], [place] [address] [place] Home Phone: [phone] Mobile Phone: [phone] Relation: Spouse Secondary Emergency Contact: Metallo MD,[name] Tracey Friday Phone: [phone] Relation: Son Interpreter needed? No  Code Status: DNR Goals of care:  Advanced Directive information    11/18/2023    1:56 AM  Advanced Directives  Does Patient Have a Medical Advance Directive? Yes  Type of Advance Directive Out of facility DNR (pink MOST or yellow form)  Does patient want to make changes to medical advance directive? No - Patient declined  Pre-existing out of facility DNR order (yellow form or pink MOST form) Physician notified to receive inpatient order     Allergies  Allergen Reactions   Morphine And Codeine Itching   Aspirin Other (See Comments)    Unknown reaction   Duloxetine  Hcl Other (See Comments)    Unknown reaction   Tymlos [Abaloparatide] Other (See Comments)    Unknown reaction   Ventolin  [Albuterol ] Other (See Comments)    rapid heart beat   Cefadroxil Hives    Patient can take amoxicillin  and cipro     Chief Complaint  Patient presents with   Acute Visit    Discharge     HPI:  83 y.o. female with medical history significant of multiple GI bleed, blood loss anemia required blood transfusion, GERD, Diverticulosis, COPD, HTN, anxiety, and CHF was admitted to [facility] for therapy following hospitalization.   Hospitalized 11/18/23-11/26/23 for lower GI bleed, hx of diverticulosis with bleeding, Hx of multiple GI bleed, 09/2023 Colonoscopy/ EGD showed  gastritis and ischemic colitis,  transfused due to Hgb 5.3.  No active bleeding since [facility] admission.  The patient's [facility] stay was uneventful except pain in the left groin travels down to left thigh, knee, lower leg with straight leg raise which has improved, negative X-ray. The patient has regained physical strength and ADL function, she is stable to transfer to [facility] for continuation of therapy.              GERD, takes Pantoprazole  bid. Followed by GI             Anemia, Hx of GI bleed, transfused,  takes Iron , Hgb 10.3 12/09/23>>8.8 12/02/23>>8.4 11/28/23             COPD O2 at night, takes Ellipta, Claritin , prn Albuterol              Pulmonary nodule pretracheal lymph node 1.3x1.2 cm CT chest 08/09/21             HTN takes Torsemide ,  Bun/creat 12/0.51 12/02/23             HLD takes Zocor              Anxiety/depression, takes Sertraline              OP takes Fosamax, Vit D             OA multiple sites, chronic right shoulder/R arm pain(rotator cuff injury), takes Tylenol , Hx of Norco use but the patient stated she is allergic to codeine and Morphine. C/o  left groin to thigh to knee to ankle pain with therapy and LLE  straight leg raise, able to walk w/o pain.              RLS, takes Requip              Urinary incontinence, on Oxybutynin              CHF mild edema BLE, taking Torsemide , Bun/creat 18/0.79 12/09/23  Past Medical History:  Diagnosis Date   Anxiety    Arthritis    "bilateral knees, shoulders, elbows; neck, pretty widespread" (09/05/2017)   BPPV (benign paroxysmal positional vertigo)    Depression    GERD (gastroesophageal reflux disease)    Glaucoma, both eyes    Headache    "probably 2/month" (09/05/2017)   History of blood transfusion ~ 2008   "related to LGIB"   Hyperlipemia    Lower GI bleeding ~ 2008; 09/05/2017   "had to have blood transfusion"   Macular degeneration, bilateral    Osteopenia    Seasonal allergies    Skin cancer, basal cell 2001   "off my nose, left  side"   Sleeping excessive    Tinnitus of both ears     Past Surgical History:  Procedure Laterality Date   BALLOON DILATION N/A 05/08/2021   Procedure: BALLOON DILATION;  Surgeon: Genell Ken, MD;  Location: [facility] ENDOSCOPY;  Service: Gastroenterology;  Laterality: N/A;   BASAL CELL CARCINOMA EXCISION  2001   "off my nose, left side"   BIOPSY  05/08/2021   Procedure: BIOPSY;  Surgeon: Genell Ken, MD;  Location: [facility] ENDOSCOPY;  Service: Gastroenterology;;   BIOPSY  04/25/2023   Procedure: BIOPSY;  Surgeon: Baldo Bonds, MD;  Location: [facility] ENDOSCOPY;  Service: Gastroenterology;;   BLEPHAROPLASTY Bilateral    BONE BIOPSY  10/26/2023   Procedure: BIOPSY, GI;  Surgeon: Felecia Hopper, MD;  Location: [facility] ENDOSCOPY;  Service: Gastroenterology;;   CATARACT EXTRACTION W/ INTRAOCULAR LENS  IMPLANT, BILATERAL Bilateral 1990's   COLONOSCOPY N/A 10/26/2023   Procedure: COLONOSCOPY;  Surgeon: Felecia Hopper, MD;  Location: [facility] ENDOSCOPY;  Service: Gastroenterology;  Laterality: N/A;   COLONOSCOPY WITH PROPOFOL  N/A 05/04/2022   Procedure: COLONOSCOPY WITH PROPOFOL ;  Surgeon: Genell Ken, MD;  Location: [facility] ENDOSCOPY;  Service: Gastroenterology;  Laterality: N/A;   COLONOSCOPY WITH PROPOFOL  N/A 04/25/2023   Procedure: COLONOSCOPY WITH PROPOFOL ;  Surgeon: Baldo Bonds, MD;  Location: [facility] ENDOSCOPY;  Service: Gastroenterology;  Laterality: N/A;   ENTEROSCOPY N/A 11/20/2023   Procedure: ENTEROSCOPY;  Surgeon: Baldo Bonds, MD;  Location: [facility] ENDOSCOPY;  Service: Gastroenterology;  Laterality: N/A;   ESOPHAGOGASTRODUODENOSCOPY N/A 10/26/2023   Procedure: EGD (ESOPHAGOGASTRODUODENOSCOPY);  Surgeon: Felecia Hopper, MD;  Location: [facility] ENDOSCOPY;  Service: Gastroenterology;  Laterality: N/A;   ESOPHAGOGASTRODUODENOSCOPY N/A 11/20/2023   Procedure: EGD (ESOPHAGOGASTRODUODENOSCOPY);  Surgeon: Baldo Bonds, MD;  Location: [facility] ENDOSCOPY;  Service: Gastroenterology;  Laterality: N/A;    ESOPHAGOGASTRODUODENOSCOPY (EGD) WITH PROPOFOL  N/A 05/08/2021   Procedure: ESOPHAGOGASTRODUODENOSCOPY (EGD) WITH PROPOFOL ;  Surgeon: Genell Ken, MD;  Location: [facility] ENDOSCOPY;  Service: Gastroenterology;  Laterality: N/A;   ESOPHAGOGASTRODUODENOSCOPY (EGD) WITH PROPOFOL  N/A 01/11/2022   Procedure: ESOPHAGOGASTRODUODENOSCOPY (EGD) WITH PROPOFOL ;  Surgeon: Ozell Blunt, MD;  Location: [facility] ENDOSCOPY;  Service: Gastroenterology;  Laterality: N/A;   ESOPHAGOGASTRODUODENOSCOPY (EGD) WITH PROPOFOL  N/A 01/06/2023   Procedure: ESOPHAGOGASTRODUODENOSCOPY (EGD) WITH PROPOFOL ;  Surgeon: Ozell Blunt, MD;  Location: [facility] ENDOSCOPY;  Service: Gastroenterology;  Laterality: N/A;   ESOPHAGOGASTRODUODENOSCOPY (EGD) WITH PROPOFOL  N/A 04/21/2023   Procedure: ESOPHAGOGASTRODUODENOSCOPY (EGD) WITH  PROPOFOL ;  Surgeon: Baldo Bonds, MD;  Location: [facility] ENDOSCOPY;  Service: Gastroenterology;  Laterality: N/A;   EYE SURGERY Bilateral    "to improve vision after cataract OR"   GIVENS CAPSULE STUDY N/A 04/21/2023   Procedure: GIVENS CAPSULE STUDY;  Surgeon: Baldo Bonds, MD;  Location: [facility] ENDOSCOPY;  Service: Gastroenterology;  Laterality: N/A;   GIVENS CAPSULE STUDY N/A 11/18/2023   Procedure: IMAGING PROCEDURE, GI TRACT, INTRALUMINAL, VIA CAPSULE;  Surgeon: Baldo Bonds, MD;  Location: [facility] ENDOSCOPY;  Service: Gastroenterology;  Laterality: N/A;   HEMOSTASIS CLIP PLACEMENT  10/26/2023   Procedure: CONTROL OF HEMORRHAGE, GI TRACT, ENDOSCOPIC, BY CLIPPING OR OVERSEWING;  Surgeon: Felecia Hopper, MD;  Location: [facility] ENDOSCOPY;  Service: Gastroenterology;;   IR ANGIOGRAM FOLLOW UP STUDY  12/19/2020   IR ANGIOGRAM SELECTIVE EACH ADDITIONAL VESSEL  12/19/2020   IR ANGIOGRAM SELECTIVE EACH ADDITIONAL VESSEL  12/19/2020   IR ANGIOGRAM VISCERAL SELECTIVE  12/19/2020   IR EMBO ART  VEN HEMORR LYMPH EXTRAV  INC GUIDE ROADMAPPING  12/19/2020   IR US  GUIDE VASC ACCESS RIGHT  12/19/2020   JOINT REPLACEMENT     POLYPECTOMY  05/04/2022    Procedure: POLYPECTOMY;  Surgeon: Genell Ken, MD;  Location: [facility] ENDOSCOPY;  Service: Gastroenterology;;   POLYPECTOMY  10/26/2023   Procedure: POLYPECTOMY, INTESTINE;  Surgeon: Felecia Hopper, MD;  Location: [facility] ENDOSCOPY;  Service: Gastroenterology;;   STAPEDES SURGERY Left    "scraped stapedes because it was sticking when it wasn't suppose to"   TONSILLECTOMY AND ADENOIDECTOMY  1946   TOTAL KNEE ARTHROPLASTY Left ~ 2008   TOTAL KNEE ARTHROPLASTY  12/20/2011   Procedure: TOTAL KNEE ARTHROPLASTY;  Surgeon: Lorriane Rote, MD;  Location: [facility] OR;  Service: Orthopedics;  Laterality: Right;  Right Total Knee Arthroplasty   TUBAL LIGATION  1980's      reports that she quit smoking about 31 years ago. Her smoking use included cigarettes. She started smoking about 68 years ago. She has a 27.8 pack-year smoking history. She has never been exposed to tobacco smoke. She has never used smokeless tobacco. She reports current alcohol  use of about 7.0 standard drinks of alcohol  per week. She reports that she does not use drugs. Social History   Socioeconomic History   Marital status: Married    Spouse name: Josiah Nigh    Number of children: 3   Years of education: College   Highest education level: Not on file  Occupational History   Occupation: Retired Runner, broadcasting/film/video  Tobacco Use   Smoking status: Former    Current packs/day: 0.00    Average packs/day: 0.8 packs/day for 37.0 years (27.8 ttl pk-yrs)    Types: Cigarettes    Start date: 07/02/1955    Quit date: 07/01/1992    Years since quitting: 31.4    Passive exposure: Never   Smokeless tobacco: Never  Vaping Use   Vaping status: Never Used  Substance and Sexual Activity   Alcohol  use: Yes    Alcohol /week: 7.0 standard drinks of alcohol     Types: 7 Glasses of wine per week    Comment: 09/05/2017  "6 oz wine, 5 days/wk"   Drug use: No    Types: Hydrocodone    Sexual activity: Not Currently  Other Topics Concern   Not on file  Social History Narrative    1-2 cups of coffee a day    Social Drivers of Health   Financial Resource Strain: Low Risk  (04/04/2023)   Overall Financial Resource Strain (CARDIA)    Difficulty  of Paying Living Expenses: Not hard at all  Food Insecurity: No Food Insecurity (11/18/2023)   Hunger Vital Sign    Worried About Running Out of Food in the Last Year: Never true    Ran Out of Food in the Last Year: Never true  Transportation Needs: No Transportation Needs (11/18/2023)   PRAPARE - Administrator, Civil Service (Medical): No    Lack of Transportation (Non-Medical): No  Physical Activity: Insufficiently Active (04/04/2023)   Exercise Vital Sign    Days of Exercise per Week: 2 days    Minutes of Exercise per Session: 20 min  Stress: No Stress Concern Present (04/04/2023)   [employer] of Occupational Health - Occupational Stress Questionnaire    Feeling of Stress : Only a little  Social Connections: Socially Integrated (11/18/2023)   Social Connection and Isolation Panel [NHANES]    Frequency of Communication with Friends and Family: More than three times a week    Frequency of Social Gatherings with Friends and Family: More than three times a week    Attends Religious Services: 1 to 4 times per year    Active Member of [employer] or Organizations: Yes    Attends Banker Meetings: 1 to 4 times per year    Marital Status: Married  Catering manager Violence: Not At Risk (11/18/2023)   Humiliation, Afraid, Rape, and Kick questionnaire    Fear of Current or Ex-Partner: No    Emotionally Abused: No    Physically Abused: No    Sexually Abused: No   Functional Status Survey:    Allergies  Allergen Reactions   Morphine And Codeine Itching   Aspirin Other (See Comments)    Unknown reaction   Duloxetine  Hcl Other (See Comments)    Unknown reaction   Tymlos [Abaloparatide] Other (See Comments)    Unknown reaction   Ventolin  [Albuterol ] Other (See Comments)    rapid heart beat    Cefadroxil Hives    Patient can take amoxicillin  and cipro     Pertinent  Health Maintenance Due  Topic Date Due   DEXA SCAN  Never done   INFLUENZA VACCINE  01/30/2024    Medications: Allergies as of 12/08/2023       Reactions   Morphine And Codeine Itching   Aspirin Other (See Comments)   Unknown reaction   Duloxetine  Hcl Other (See Comments)   Unknown reaction   Tymlos [abaloparatide] Other (See Comments)   Unknown reaction   Ventolin  [albuterol ] Other (See Comments)   rapid heart beat   Cefadroxil Hives   Patient can take amoxicillin  and cipro         Medication List        Accurate as of December 08, 2023 11:59 PM. If you have any questions, ask your nurse or doctor.          acetaminophen  650 MG CR tablet Commonly known as: TYLENOL  Take 1,300 mg by mouth in the morning and at bedtime.   acetaminophen  500 MG tablet Commonly known as: TYLENOL  Take 500 mg by mouth daily as needed for moderate pain (pain score 4-6).   albuterol  108 (90 Base) MCG/ACT inhaler Commonly known as: VENTOLIN  HFA Inhale 2 puffs into the lungs every 4 (four) hours as needed for wheezing or shortness of breath.   alendronate 70 MG tablet Commonly known as: FOSAMAX Take 70 mg by mouth every Sunday. Take with a full glass of water on an empty stomach.   Align 4 MG  Caps Take 4 mg by mouth at bedtime.   Anoro Ellipta  62.5-25 MCG/ACT Aepb Generic drug: umeclidinium-vilanterol Inhale 1 puff into the lungs every morning.   cetirizine  5 MG chewable tablet Commonly known as: ZYRTEC  Chew 5 mg by mouth daily as needed for allergies.   cyanocobalamin  1000 MCG tablet Commonly known as: VITAMIN B12 Take 1,000 mcg by mouth daily.   docusate sodium  100 MG capsule Commonly known as: COLACE Take 200 mg by mouth at bedtime.   Dorzolamide  HCl-Timolol  Mal PF 2-0.5 % Soln Place 1 drop into both eyes in the morning and at bedtime.   ferrous gluconate  240 (27 FE) MG tablet Commonly known as:  FERGON Take 240 mg by mouth every morning. Take without food   fluticasone  50 MCG/ACT nasal spray Commonly known as: FLONASE  Place 1 spray into both nostrils daily as needed for allergies or rhinitis.   guaiFENesin  100 MG/5ML liquid Commonly known as: ROBITUSSIN Take 10 mLs by mouth every 4 (four) hours as needed for cough or to loosen phlegm.   latanoprost  0.005 % ophthalmic solution Commonly known as: XALATAN  Place 1 drop into both eyes at bedtime.   oxybutynin  10 MG 24 hr tablet Commonly known as: DITROPAN -XL Take 10 mg by mouth at bedtime.   pantoprazole  40 MG tablet Commonly known as: PROTONIX  Take 1 tablet (40 mg total) by mouth 2 (two) times daily before a meal.   polyethylene glycol 17 g packet Commonly known as: MIRALAX  / GLYCOLAX  Take 8.5 g by mouth daily as needed (constipation).   potassium chloride  SA 20 MEQ tablet Commonly known as: KLOR-CON  M Take 1 tablet (20 mEq total) by mouth daily.   PreserVision AREDS 2 Caps Take 1 capsule by mouth 2 (two) times daily.   PreviDent 5000 Booster Plus 1.1 % Pste Generic drug: Sodium Fluoride  Place 1 Application onto teeth See admin instructions. Brush on teeth with a toothbrush after evening mouth care. Spit out excess and do not rinse.   Rhopressa  0.02 % Soln Generic drug: Netarsudil  Dimesylate Place 1 drop into both eyes at bedtime.   rOPINIRole  0.5 MG tablet Commonly known as: REQUIP  Take 1 tablet (0.5 mg total) by mouth daily as needed.   sertraline  50 MG tablet Commonly known as: ZOLOFT  Take 1.5 tablets (75 mg total) by mouth daily.   simvastatin  20 MG tablet Commonly known as: ZOCOR  Take 20 mg by mouth every evening.   torsemide  20 MG tablet Commonly known as: DEMADEX  Take 20 mg by mouth daily.   vitamin C  1000 MG tablet Take 1,000 mg by mouth every morning.   Vitamin D3 50 MCG (2000 UT) Tabs Take 2,000 Units by mouth every morning.   zinc oxide 20 % ointment Apply 1 Application topically See  admin instructions. Apply topically to buttocks after every incontinent episode and as needed for redness        Review of Systems  Constitutional:  Negative for activity change, fatigue and fever.  HENT:  Positive for hearing loss. Negative for congestion and trouble swallowing.   Eyes:  Negative for visual disturbance.  Respiratory:  Negative for cough, shortness of breath and wheezing.   Cardiovascular:  Positive for leg swelling.  Gastrointestinal:  Negative for abdominal pain, blood in stool and constipation.  Genitourinary:  Positive for frequency. Negative for difficulty urinating, dysuria, flank pain and urgency.  Musculoskeletal:  Positive for arthralgias and gait problem.  Skin:  Negative for color change.  Neurological:  Negative for tremors and headaches.  Psychiatric/Behavioral:  Negative for behavioral problems and sleep disturbance. The patient is not nervous/anxious.     Vitals:   12/08/23 1652  BP: 138/67  Pulse: 94  Resp: 18  Temp: (!) 97.1 F (36.2 C)  SpO2: 95%  Weight: 161 lb 9.6 oz (73.3 kg)   Body mass index is 30.53 kg/m. Physical Exam Nursing note reviewed.  Constitutional:      Appearance: Normal appearance.  HENT:     Head: Normocephalic and atraumatic.     Nose: Nose normal.  Eyes:     Extraocular Movements: Extraocular movements intact.     Conjunctiva/sclera: Conjunctivae normal.     Pupils: Pupils are equal, round, and reactive to light.  Cardiovascular:     Rate and Rhythm: Normal rate and regular rhythm.     Heart sounds: No murmur heard. Pulmonary:     Effort: Pulmonary effort is normal.  Abdominal:     General: Bowel sounds are normal. There is no distension.     Palpations: Abdomen is soft.     Tenderness: There is no abdominal tenderness. There is no right CVA tenderness, left CVA tenderness, guarding or rebound.  Musculoskeletal:        General: Tenderness present. No swelling, deformity or signs of injury. Normal range of  motion.     Cervical back: Normal range of motion and neck supple.     Right lower leg: Edema present.     Left lower leg: Edema present.     Comments: Trace edema BLE Chronic R shoulder/arm pain, full ROM, hx of rotator cuff injury Left groin to thigh to ankle pain with therapy, straight leg raise, able to weight bearing and walking-improved.   Skin:    General: Skin is warm and dry.  Neurological:     General: No focal deficit present.     Mental Status: She is alert and oriented to person, place, and time. Mental status is at baseline.     Gait: Gait abnormal.  Psychiatric:        Mood and Affect: Mood normal.        Behavior: Behavior normal.        Thought Content: Thought content normal.        Judgment: Judgment normal.     Labs reviewed: Basic Metabolic Panel: Recent Labs    11/18/23 1025 11/19/23 0302 11/19/23 0904 11/20/23 0254 11/21/23 0310 11/23/23 0650 11/24/23 0541 11/25/23 0508  NA 138   < >  --  141   < > 139 136 134*  K 3.0*   < >  --  3.8   < > 3.6 3.7 3.6  CL 105   < >  --  112*   < > 107 103 105  CO2 25   < >  --  23   < > 24 24 25   GLUCOSE 148*   < >  --  130*   < > 114* 120* 120*  BUN 25*   < >  --  13   < > 11 12 9   CREATININE 0.57   < >  --  <0.30*   < > 0.47 0.48 0.33*  CALCIUM  7.4*   < >  --  7.9*   < > 8.4* 8.3* 7.9*  MG 1.9  --   --  2.0   < > 2.2 2.3 2.2  PHOS 2.6  --  2.2* 1.8*  --   --   --   --    < > =  values in this interval not displayed.   Liver Function Tests: Recent Labs    10/24/23 2107 11/18/23 0210 11/19/23 0302  AST 20 15 14*  ALT 10 10 8   ALKPHOS 66 40 29*  BILITOT 0.6 0.7 0.8  PROT 6.6 5.5* 4.5*  ALBUMIN 3.7 3.3* 2.6*   No results for input(s): "LIPASE", "AMYLASE" in the last 8760 hours. No results for input(s): "AMMONIA" in the last 8760 hours. CBC: Recent Labs    04/26/23 1025 04/27/23 0629 07/14/23 0000 10/24/23 2107 11/18/23 0210 11/18/23 1025 11/23/23 0650 11/24/23 0541 11/25/23 0508  WBC 5.7   <  > 4.6   < > 10.3   < > 6.5 6.8 5.5  NEUTROABS 3.7  --  2,640.00  --  6.9  --   --   --   --   HGB 8.8*   < > 11.7*   < > 5.3*   < > 8.3* 8.2* 8.1*  HCT 28.8*   < > 37   < > 17.0*   < > 27.8* 26.9* 26.9*  MCV 99.3   < >  --    < > 103.7*   < > 104.1* 102.7* 104.7*  PLT 196   < > 156   < > 179   < > 178 189 196   < > = values in this interval not displayed.   Cardiac Enzymes: No results for input(s): "CKTOTAL", "CKMB", "CKMBINDEX", "TROPONINI" in the last 8760 hours. BNP: Invalid input(s): "POCBNP" CBG: Recent Labs    11/21/23 2332 11/22/23 1213 11/22/23 1651  GLUCAP 140* 148* 114*    Procedures and Imaging Studies During Stay: No results found.  Assessment/Plan:   Chronic heart failure with preserved ejection fraction (HFpEF) (HCC)  mild edema BLE, taking Torsemide   Mixed stress and urge urinary incontinence Managed on Oxybutynin   Restless leg syndrome Stable,  takes Requip   Osteoarthritis, multiple sites chronic right shoulder/R arm pain(rotator cuff injury), takes Tylenol , Hx of Norco use but the patient stated she is allergic to codeine and Morphine. C/o left groin to thigh to knee to ankle pain with therapy and LLE  straight leg raise, able to walk w/o pain.   Senile osteoporosis takes Fosamax, Vit D  Recurrent depression (HCC) Stable,  takes Sertraline   Hyperlipidemia takes Zocor   Hypertension Blood pressure is controlled,  takes Torsemide ,  Bun/creat 12/0.51 12/02/23  COPD (chronic obstructive pulmonary disease) (HCC) O2 at night, takes Ellipta, Claritin , prn Albuterol   Iron  deficiency anemia Hx of GI bleed, transfused,  takes Iron , Hgb 10.3 12/09/23>>8.8 12/02/23>>8.4 11/28/23  GERD (gastroesophageal reflux disease) Stable, takes Pantoprazole  bid. Followed by GI  Closed compression fracture of body of L1 vertebra (HCC) 12/05/23 X-ray L femur, pelvis, L spine, sacrum/coccyx, L1 compression fx approximately 50% height loss unknown chronicity Contributory to the  left groin to leg pain with straight leg raise, getting better now, f/u Xray, CT, MRI, ortho if needed.    Patient is being discharged with the following home health services:    Patient is being discharged with the following durable medical equipment:    Patient has been advised to f/u with their PCP in 1-2 weeks to bring them up to date on their rehab stay.  Social services at facility was responsible for arranging this appointment.  Pt was provided with a 30 day supply of prescriptions for medications and refills must be obtained from their PCP.  For controlled substances, a more limited supply may be provided adequate until PCP appointment only.  Future labs/tests needed: prn

## 2023-12-09 DIAGNOSIS — S32010A Wedge compression fracture of first lumbar vertebra, initial encounter for closed fracture: Secondary | ICD-10-CM | POA: Insufficient documentation

## 2023-12-09 NOTE — Assessment & Plan Note (Signed)
 Blood pressure is controlled, takes Torsemide , Bun/creat 12/0.51 12/02/23

## 2023-12-09 NOTE — Assessment & Plan Note (Addendum)
 Hx of GI bleed, transfused,  takes Iron , Hgb 10.3 12/09/23>>8.8 12/02/23>>8.4 11/28/23

## 2023-12-09 NOTE — Assessment & Plan Note (Signed)
Stable, takes Requip 

## 2023-12-09 NOTE — Assessment & Plan Note (Signed)
 takes Zocor 

## 2023-12-09 NOTE — Assessment & Plan Note (Signed)
 12/05/23 X-ray L femur, pelvis, L spine, sacrum/coccyx, L1 compression fx approximately 50% height loss unknown chronicity Contributory to the left groin to leg pain with straight leg raise, getting better now, f/u Xray, CT, MRI, ortho if needed.

## 2023-12-09 NOTE — Assessment & Plan Note (Signed)
takes Fosamax, Vit D

## 2023-12-09 NOTE — Assessment & Plan Note (Signed)
 O2 at night, takes Ellipta, Claritin , prn Albuterol 

## 2023-12-09 NOTE — Assessment & Plan Note (Signed)
 chronic right shoulder/R arm pain(rotator cuff injury), takes Tylenol , Hx of Norco use but the patient stated she is allergic to codeine and Morphine. C/o left groin to thigh to knee to ankle pain with therapy and LLE  straight leg raise, able to walk w/o pain.

## 2023-12-09 NOTE — Assessment & Plan Note (Signed)
 Stable, takes Pantoprazole  bid. Followed by GI

## 2023-12-09 NOTE — Assessment & Plan Note (Signed)
Stable,  takes Sertraline

## 2023-12-09 NOTE — Assessment & Plan Note (Addendum)
 mild edema BLE, taking Torsemide 

## 2023-12-09 NOTE — Assessment & Plan Note (Signed)
 Managed on Oxybutynin 

## 2023-12-10 DIAGNOSIS — F4001 Agoraphobia with panic disorder: Secondary | ICD-10-CM | POA: Diagnosis not present

## 2023-12-10 DIAGNOSIS — F330 Major depressive disorder, recurrent, mild: Secondary | ICD-10-CM | POA: Diagnosis not present

## 2023-12-12 DIAGNOSIS — M6281 Muscle weakness (generalized): Secondary | ICD-10-CM | POA: Diagnosis not present

## 2023-12-12 DIAGNOSIS — R41841 Cognitive communication deficit: Secondary | ICD-10-CM | POA: Diagnosis not present

## 2023-12-12 DIAGNOSIS — K9289 Other specified diseases of the digestive system: Secondary | ICD-10-CM | POA: Diagnosis not present

## 2023-12-12 DIAGNOSIS — R29898 Other symptoms and signs involving the musculoskeletal system: Secondary | ICD-10-CM | POA: Diagnosis not present

## 2023-12-15 ENCOUNTER — Encounter: Payer: Self-pay | Admitting: Orthopedic Surgery

## 2023-12-15 ENCOUNTER — Non-Acute Institutional Stay (SKILLED_NURSING_FACILITY): Payer: Self-pay | Admitting: Orthopedic Surgery

## 2023-12-15 DIAGNOSIS — N3281 Overactive bladder: Secondary | ICD-10-CM | POA: Diagnosis not present

## 2023-12-15 DIAGNOSIS — K922 Gastrointestinal hemorrhage, unspecified: Secondary | ICD-10-CM | POA: Diagnosis not present

## 2023-12-15 DIAGNOSIS — Z7189 Other specified counseling: Secondary | ICD-10-CM

## 2023-12-15 DIAGNOSIS — M6281 Muscle weakness (generalized): Secondary | ICD-10-CM | POA: Diagnosis not present

## 2023-12-15 DIAGNOSIS — R2689 Other abnormalities of gait and mobility: Secondary | ICD-10-CM | POA: Diagnosis not present

## 2023-12-15 NOTE — Progress Notes (Unsigned)
 Location:  [facility] Room Number: 32/A Place of Service:  SNF (31) Provider:  Arnetha Bhat, NP   Arnetha Bhat, NP  Patient Care Team: Arnetha Bhat, NP as PCP - General (Adult Health Nurse Practitioner) Marguerite Shiley, MD as Consulting Physician (Internal Medicine)  Extended Emergency Contact Information Primary Emergency Contact: Kolodziej,[name] Address: [address]          [place], [place] [address] [place] Home Phone: [phone] Mobile Phone: [phone] Relation: Spouse Secondary Emergency Contact: Silbernagel MD,[name] Tracey Friday Phone: [phone] Relation: Son Interpreter needed? No  Code Status:  DNR Goals of care: Advanced Directive information    11/18/2023    1:56 AM  Advanced Directives  Does Patient Have a Medical Advance Directive? Yes  Type of Advance Directive Out of facility DNR (pink MOST or yellow form)  Does patient want to make changes to medical advance directive? No - Patient declined  Pre-existing out of facility DNR order (yellow form or pink MOST form) Physician notified to receive inpatient order     Chief Complaint  Patient presents with   Acute Visit    Urinary frequency    HPI:  Pt is a 83 y.o. female seen today for medical management of chronic diseases.     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, left side   Sleeping excessive    Tinnitus of both ears     Past Surgical History:  Procedure Laterality Date   BALLOON DILATION N/A 05/08/2021   Procedure: BALLOON DILATION;  Surgeon: Genell Ken, MD;  Location: [facility] ENDOSCOPY;  Service: Gastroenterology;  Laterality: N/A;   BASAL CELL CARCINOMA EXCISION  2001   off my nose, left side   BIOPSY  05/08/2021   Procedure: BIOPSY;  Surgeon: Genell Ken, MD;  Location: [facility] ENDOSCOPY;  Service: Gastroenterology;;   BIOPSY  04/25/2023   Procedure: BIOPSY;  Surgeon: Baldo Bonds, MD;  Location: [facility] ENDOSCOPY;  Service: Gastroenterology;;   BLEPHAROPLASTY Bilateral    BONE BIOPSY  10/26/2023   Procedure: BIOPSY, GI;  Surgeon: Felecia Hopper, MD;  Location: [facility] ENDOSCOPY;  Service: Gastroenterology;;   CATARACT EXTRACTION W/ INTRAOCULAR LENS  IMPLANT, BILATERAL Bilateral 1990's   COLONOSCOPY N/A 10/26/2023   Procedure: COLONOSCOPY;  Surgeon: Felecia Hopper, MD;  Location: [facility] ENDOSCOPY;  Service: Gastroenterology;  Laterality: N/A;   COLONOSCOPY WITH PROPOFOL  N/A 05/04/2022   Procedure: COLONOSCOPY WITH PROPOFOL ;  Surgeon: Genell Ken, MD;  Location: [facility] ENDOSCOPY;  Service: Gastroenterology;  Laterality: N/A;   COLONOSCOPY WITH PROPOFOL  N/A 04/25/2023   Procedure: COLONOSCOPY WITH PROPOFOL ;  Surgeon: Baldo Bonds, MD;  Location: [facility] ENDOSCOPY;  Service: Gastroenterology;  Laterality: N/A;   ENTEROSCOPY N/A 11/20/2023   Procedure: ENTEROSCOPY;  Surgeon: Baldo Bonds, MD;  Location: [facility] ENDOSCOPY;  Service: Gastroenterology;  Laterality: N/A;   ESOPHAGOGASTRODUODENOSCOPY N/A 10/26/2023  Procedure: EGD (ESOPHAGOGASTRODUODENOSCOPY);  Surgeon: Felecia Hopper, MD;  Location: [facility] ENDOSCOPY;  Service: Gastroenterology;  Laterality: N/A;   ESOPHAGOGASTRODUODENOSCOPY N/A 11/20/2023   Procedure: EGD (ESOPHAGOGASTRODUODENOSCOPY);  Surgeon: Baldo Bonds, MD;  Location: [facility] ENDOSCOPY;  Service: Gastroenterology;  Laterality: N/A;   ESOPHAGOGASTRODUODENOSCOPY (EGD) WITH PROPOFOL  N/A 05/08/2021   Procedure:  ESOPHAGOGASTRODUODENOSCOPY (EGD) WITH PROPOFOL ;  Surgeon: Genell Ken, MD;  Location: [facility] ENDOSCOPY;  Service: Gastroenterology;  Laterality: N/A;   ESOPHAGOGASTRODUODENOSCOPY (EGD) WITH PROPOFOL  N/A 01/11/2022   Procedure: ESOPHAGOGASTRODUODENOSCOPY (EGD) WITH PROPOFOL ;  Surgeon: Ozell Blunt, MD;  Location: [facility] ENDOSCOPY;  Service: Gastroenterology;  Laterality: N/A;   ESOPHAGOGASTRODUODENOSCOPY (EGD) WITH PROPOFOL  N/A 01/06/2023   Procedure: ESOPHAGOGASTRODUODENOSCOPY (EGD) WITH PROPOFOL ;  Surgeon: Ozell Blunt, MD;  Location: [facility] ENDOSCOPY;  Service: Gastroenterology;  Laterality: N/A;   ESOPHAGOGASTRODUODENOSCOPY (EGD) WITH PROPOFOL  N/A 04/21/2023   Procedure: ESOPHAGOGASTRODUODENOSCOPY (EGD) WITH PROPOFOL ;  Surgeon: Baldo Bonds, MD;  Location: [facility] ENDOSCOPY;  Service: Gastroenterology;  Laterality: N/A;   EYE SURGERY Bilateral    to improve vision after cataract OR   GIVENS CAPSULE STUDY N/A 04/21/2023   Procedure: GIVENS CAPSULE STUDY;  Surgeon: Baldo Bonds, MD;  Location: [facility] ENDOSCOPY;  Service: Gastroenterology;  Laterality: N/A;   GIVENS CAPSULE STUDY N/A 11/18/2023   Procedure: IMAGING PROCEDURE, GI TRACT, INTRALUMINAL, VIA CAPSULE;  Surgeon: Baldo Bonds, MD;  Location: [facility] ENDOSCOPY;  Service: Gastroenterology;  Laterality: N/A;   HEMOSTASIS CLIP PLACEMENT  10/26/2023   Procedure: CONTROL OF HEMORRHAGE, GI TRACT, ENDOSCOPIC, BY CLIPPING OR OVERSEWING;  Surgeon: Felecia Hopper, MD;  Location: [facility] ENDOSCOPY;  Service: Gastroenterology;;   IR ANGIOGRAM FOLLOW UP STUDY  12/19/2020   IR ANGIOGRAM SELECTIVE EACH ADDITIONAL VESSEL  12/19/2020   IR ANGIOGRAM SELECTIVE EACH ADDITIONAL VESSEL  12/19/2020   IR ANGIOGRAM VISCERAL SELECTIVE  12/19/2020   IR EMBO ART  VEN HEMORR LYMPH EXTRAV  INC GUIDE ROADMAPPING  12/19/2020   IR US  GUIDE VASC ACCESS RIGHT  12/19/2020   JOINT REPLACEMENT     POLYPECTOMY  05/04/2022   Procedure: POLYPECTOMY;  Surgeon: Genell Ken, MD;  Location: [facility] ENDOSCOPY;   Service: Gastroenterology;;   POLYPECTOMY  10/26/2023   Procedure: POLYPECTOMY, INTESTINE;  Surgeon: Felecia Hopper, MD;  Location: [facility] ENDOSCOPY;  Service: Gastroenterology;;   STAPEDES SURGERY Left    scraped stapedes because it was sticking when it wasn't suppose to   TONSILLECTOMY AND ADENOIDECTOMY  1946   TOTAL KNEE ARTHROPLASTY Left ~ 2008   TOTAL KNEE ARTHROPLASTY  12/20/2011   Procedure: TOTAL KNEE ARTHROPLASTY;  Surgeon: Lorriane Rote, MD;  Location: [facility] OR;  Service: Orthopedics;  Laterality: Right;  Right Total Knee Arthroplasty   TUBAL LIGATION  1980's    Allergies  Allergen Reactions   Morphine And Codeine Itching   Aspirin Other (See Comments)    Unknown reaction   Duloxetine  Hcl Other (See Comments)    Unknown reaction   Tymlos [Abaloparatide] Other (See Comments)    Unknown reaction   Ventolin  [Albuterol ] Other (See Comments)    rapid heart beat   Cefadroxil Hives    Patient can take amoxicillin  and cipro     Outpatient Encounter Medications as of 12/15/2023  Medication Sig   acetaminophen  (TYLENOL ) 500 MG tablet Take 500 mg by mouth daily as needed for moderate pain (pain score 4-6).   acetaminophen  (TYLENOL ) 650 MG CR tablet Take 1,300 mg by mouth in the morning and at bedtime.   albuterol  (VENTOLIN  HFA) 108 (90 Base) MCG/ACT inhaler Inhale 2 puffs into the lungs every  4 (four) hours as needed for wheezing or shortness of breath.   alendronate (FOSAMAX) 70 MG tablet Take 70 mg by mouth every Sunday. Take with a full glass of water on an empty stomach.   Ascorbic Acid  (VITAMIN C ) 1000 MG tablet Take 1,000 mg by mouth every morning.   cetirizine  (ZYRTEC ) 5 MG chewable tablet Chew 5 mg by mouth daily as needed for allergies.   Cholecalciferol  (VITAMIN D3) 50 MCG (2000 UT) TABS Take 2,000 Units by mouth every morning.   cyanocobalamin  (VITAMIN B12) 1000 MCG tablet Take 1,000 mcg by mouth daily.   docusate sodium  (COLACE) 100 MG capsule Take 200 mg by mouth at  bedtime.   Dorzolamide  HCl-Timolol  Mal PF 2-0.5 % SOLN Place 1 drop into both eyes in the morning and at bedtime.   ferrous gluconate  (FERGON) 240 (27 FE) MG tablet Take 240 mg by mouth every morning. Take without food   fluticasone  (FLONASE ) 50 MCG/ACT nasal spray Place 1 spray into both nostrils daily as needed for allergies or rhinitis.   guaiFENesin  (ROBITUSSIN) 100 MG/5ML liquid Take 10 mLs by mouth every 4 (four) hours as needed for cough or to loosen phlegm.   latanoprost  (XALATAN ) 0.005 % ophthalmic solution Place 1 drop into both eyes at bedtime.   Multiple Vitamins-Minerals (PRESERVISION AREDS 2) CAPS Take 1 capsule by mouth 2 (two) times daily.   Netarsudil  Dimesylate (RHOPRESSA ) 0.02 % SOLN Place 1 drop into both eyes at bedtime.   oxybutynin  (DITROPAN -XL) 10 MG 24 hr tablet Take 10 mg by mouth at bedtime.   pantoprazole  (PROTONIX ) 40 MG tablet Take 1 tablet (40 mg total) by mouth 2 (two) times daily before a meal.   polyethylene glycol (MIRALAX  / GLYCOLAX ) 17 g packet Take 8.5 g by mouth daily as needed (constipation).   potassium chloride  SA (KLOR-CON  M) 20 MEQ tablet Take 1 tablet (20 mEq total) by mouth daily.   Probiotic Product (ALIGN) 4 MG CAPS Take 4 mg by mouth at bedtime.   rOPINIRole  (REQUIP ) 0.5 MG tablet Take 1 tablet (0.5 mg total) by mouth daily as needed.   sertraline  (ZOLOFT ) 50 MG tablet Take 1.5 tablets (75 mg total) by mouth daily.   simvastatin  (ZOCOR ) 20 MG tablet Take 20 mg by mouth every evening.   Sodium Fluoride  (PREVIDENT 5000 BOOSTER PLUS) 1.1 % PSTE Place 1 Application onto teeth See admin instructions. Brush on teeth with a toothbrush after evening mouth care. Spit out excess and do not rinse.   torsemide  (DEMADEX ) 20 MG tablet Take 20 mg by mouth daily.   umeclidinium-vilanterol (ANORO ELLIPTA ) 62.5-25 MCG/ACT AEPB Inhale 1 puff into the lungs every morning.   zinc oxide 20 % ointment Apply 1 Application topically See admin instructions. Apply topically  to buttocks after every incontinent episode and as needed for redness   No facility-administered encounter medications on file as of 12/15/2023.    Review of Systems  Immunization History  Administered Date(s) Administered   Dtap, Unspecified 03/12/2018   Fluad Quad(high Dose 65+) 04/24/2022   Fluad Trivalent(High Dose 65+) 04/27/2023   Fluzone Influenza virus vaccine,trivalent (IIV3), split virus 09/22/2009, 03/29/2010, 07/23/2011, 04/03/2012, 03/30/2014   Influenza Inj Mdck Quad Pf 04/12/2017   Influenza, High Dose Seasonal PF 04/15/2013, 03/14/2018   Influenza, Mdck, Trivalent,PF 6+ MOS(egg free) 03/16/2016, 04/12/2017   Influenza, Quadrivalent, Recombinant, Inj, Pf 03/05/2019, 05/22/2021   Influenza,inj,Quad PF,6+ Mos 03/30/2014, 03/25/2015   PFIZER(Purple Top)SARS-COV-2 Vaccination 07/25/2019, 08/16/2019, 03/31/2020, 03/31/2021, 12/12/2021   Pneumococcal Conjugate-13 10/19/2013, 11/01/2014  Pneumococcal Polysaccharide-23 09/22/2009, 05/01/2010, 07/23/2011   Pneumococcal-Unspecified 03/02/2011   Td (Adult),5 Lf Tetanus Toxid, Preservative Free 07/23/2011   Tdap 09/22/2009, 07/23/2011, 03/12/2018   Zoster Recombinant(Shingrix) 10/08/2017, 12/09/2017   Zoster, Live 09/22/2009, 07/23/2011, 12/16/2017   Pertinent  Health Maintenance Due  Topic Date Due   DEXA SCAN  Never done   INFLUENZA VACCINE  01/30/2024      10/30/2022   12:44 PM 01/22/2023    1:15 PM 04/04/2023   11:00 AM 07/10/2023    9:02 AM 10/24/2023    2:27 PM  Fall Risk  Falls in the past year? 0 0 0 0 0  Was there an injury with Fall? 0 0 0 0 0  Fall Risk Category Calculator 0 0 0 0 0  Patient at Risk for Falls Due to History of fall(s);Impaired balance/gait;Impaired mobility History of fall(s);Impaired balance/gait History of fall(s);Impaired balance/gait;Impaired mobility History of fall(s);Impaired balance/gait;Impaired mobility History of fall(s);Impaired balance/gait;Impaired vision  Fall risk Follow up Falls  evaluation completed;Education provided;Falls prevention discussed Falls evaluation completed;Education provided;Falls prevention discussed Falls evaluation completed;Education provided;Falls prevention discussed Falls evaluation completed;Education provided;Falls prevention discussed Falls evaluation completed;Education provided   Functional Status Survey:    Vitals:   12/15/23 1631  BP: 108/62  Pulse: 80  Resp: 20  Temp: 97.9 F (36.6 C)  SpO2: 93%  Weight: 163 lb 6.4 oz (74.1 kg)  Height: 5' 1 (1.549 m)   Body mass index is 30.87 kg/m. Physical Exam  Labs reviewed: Recent Labs    11/18/23 1025 11/19/23 0302 11/19/23 0904 11/20/23 0254 11/21/23 0310 11/23/23 0650 11/24/23 0541 11/25/23 0508  NA 138   < >  --  141   < > 139 136 134*  K 3.0*   < >  --  3.8   < > 3.6 3.7 3.6  CL 105   < >  --  112*   < > 107 103 105  CO2 25   < >  --  23   < > 24 24 25   GLUCOSE 148*   < >  --  130*   < > 114* 120* 120*  BUN 25*   < >  --  13   < > 11 12 9   CREATININE 0.57   < >  --  <0.30*   < > 0.47 0.48 0.33*  CALCIUM  7.4*   < >  --  7.9*   < > 8.4* 8.3* 7.9*  MG 1.9  --   --  2.0   < > 2.2 2.3 2.2  PHOS 2.6  --  2.2* 1.8*  --   --   --   --    < > = values in this interval not displayed.   Recent Labs    10/24/23 2107 11/18/23 0210 11/19/23 0302  AST 20 15 14*  ALT 10 10 8   ALKPHOS 66 40 29*  BILITOT 0.6 0.7 0.8  PROT 6.6 5.5* 4.5*  ALBUMIN 3.7 3.3* 2.6*   Recent Labs    04/26/23 1025 04/27/23 0629 07/14/23 0000 10/24/23 2107 11/18/23 0210 11/18/23 1025 11/23/23 0650 11/24/23 0541 11/25/23 0508  WBC 5.7   < > 4.6   < > 10.3   < > 6.5 6.8 5.5  NEUTROABS 3.7  --  2,640.00  --  6.9  --   --   --   --   HGB 8.8*   < > 11.7*   < > 5.3*   < > 8.3* 8.2* 8.1*  HCT 28.8*   < > 37   < > 17.0*   < > 27.8* 26.9* 26.9*  MCV 99.3   < >  --    < > 103.7*   < > 104.1* 102.7* 104.7*  PLT 196   < > 156   < > 179   < > 178 189 196   < > = values in this interval not displayed.    Lab Results  Component Value Date   TSH 2.47 10/08/2022   No results found for: HGBA1C Lab Results  Component Value Date   CHOL 125 07/14/2023   HDL 51 07/14/2023   LDLCALC 53 07/14/2023   TRIG 130 07/14/2023    Significant Diagnostic Results in last 30 days:  No results found.  Assessment/Plan There are no diagnoses linked to this encounter.   Family/ staff Communication: ***  Labs/tests ordered:  ***

## 2023-12-16 DIAGNOSIS — R41841 Cognitive communication deficit: Secondary | ICD-10-CM | POA: Diagnosis not present

## 2023-12-17 DIAGNOSIS — R2689 Other abnormalities of gait and mobility: Secondary | ICD-10-CM | POA: Diagnosis not present

## 2023-12-17 DIAGNOSIS — M6281 Muscle weakness (generalized): Secondary | ICD-10-CM | POA: Diagnosis not present

## 2023-12-18 DIAGNOSIS — R41841 Cognitive communication deficit: Secondary | ICD-10-CM | POA: Diagnosis not present

## 2023-12-18 DIAGNOSIS — D649 Anemia, unspecified: Secondary | ICD-10-CM | POA: Diagnosis not present

## 2023-12-18 LAB — CBC AND DIFFERENTIAL
HCT: 35 — AB (ref 36–46)
Hemoglobin: 11 — AB (ref 12.0–16.0)
Neutrophils Absolute: 2137
Platelets: 184 10*3/uL (ref 150–400)
WBC: 4.3

## 2023-12-18 LAB — CBC: RBC: 3.61 — AB (ref 3.87–5.11)

## 2023-12-19 DIAGNOSIS — R2689 Other abnormalities of gait and mobility: Secondary | ICD-10-CM | POA: Diagnosis not present

## 2023-12-19 DIAGNOSIS — M6281 Muscle weakness (generalized): Secondary | ICD-10-CM | POA: Diagnosis not present

## 2023-12-22 ENCOUNTER — Non-Acute Institutional Stay (SKILLED_NURSING_FACILITY): Payer: Self-pay | Admitting: Orthopedic Surgery

## 2023-12-22 ENCOUNTER — Encounter: Payer: Self-pay | Admitting: Orthopedic Surgery

## 2023-12-22 DIAGNOSIS — K922 Gastrointestinal hemorrhage, unspecified: Secondary | ICD-10-CM

## 2023-12-22 DIAGNOSIS — F339 Major depressive disorder, recurrent, unspecified: Secondary | ICD-10-CM

## 2023-12-22 DIAGNOSIS — M150 Primary generalized (osteo)arthritis: Secondary | ICD-10-CM

## 2023-12-22 DIAGNOSIS — N3281 Overactive bladder: Secondary | ICD-10-CM | POA: Diagnosis not present

## 2023-12-22 MED ORDER — TRAMADOL HCL 25 MG PO TABS: 25.0000 mg | ORAL_TABLET | Freq: Two times a day (BID) | ORAL | 0 refills | Status: DC

## 2023-12-22 NOTE — Progress Notes (Signed)
 Location:  [facility] Room Number: AL20-A Place of Service:  SNF (31) Provider:  Gil Greig FORBES CARROLYN Gil Greig FORBES, NP  Patient Care Team: Gil Greig FORBES, NP as PCP - General (Adult Health Nurse Practitioner) Charlanne Fredia CROME, MD as Consulting Physician (Internal Medicine)  Extended Emergency Contact Information Primary Emergency Contact: Sofranko,[name] Address: [address]          [place], [place] [address] [place] Home Phone: [phone] Mobile Phone: [phone] Relation: Spouse Secondary Emergency Contact: Collister MD,[name] Ozell Crane Phone: [phone] Relation: Son Interpreter needed? No  Code Status:  DNR Goals of care: Advanced Directive information    11/18/2023    1:56 AM  Advanced Directives  Does Patient Have a Medical Advance Directive? Yes  Type of Advance Directive Out of facility DNR (pink MOST or yellow form)  Does patient want to make changes to medical advance directive? No - Patient declined  Pre-existing out of facility DNR order (yellow form or pink MOST form) Physician notified to receive inpatient order     Chief Complaint  Patient presents with   Acute Visit    Urinary frequency    HPI:  Pt is a 83 y.o. female seen today for an acute visit due to urinary frequency.   She currently resides on the assisted living unit at [facility]. PMH: HTN, COPD, pulmonary nodule, Diverticulosis with GI bleed, GERD, OA, osteoporosis,macular degeneration, iron  def anemia, unstable gait, urinary incontinence, anxiety, and depression.   06/16 oxybutynin  was held x 3 days for effectiveness. She c/o increased urinary frequency at that time, no symptoms to suggest UTI. Today, she reports improved urinary frequency with oxybutynin . She also decreased caffeine intake. Denies dysuria, malaise or flank pain.   Recent hgb 11.0 12/18/2023. Due to poor vision associated with MD. Nursing is checking stools for blood. No recent  episodes of bleeding. Goals of care discussed last encounter. She states  I do not want to go back to hospital. MOST form has not been done. She is willing to check blood counts prn if bleeding episode occurs.   Tramadol continues to be effective for joint pain. No recent falls or injuries. Ambulates with walker.   H/o depression. Remains on Zoloft . No changes in mood. Followed by [facility]> last seen 06/11> no recommendations.   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, left side   Sleeping excessive    Tinnitus of both ears    Past Surgical History:  Procedure Laterality Date   BALLOON DILATION N/A 05/08/2021   Procedure: BALLOON DILATION;  Surgeon: Saintclair Jasper, MD;  Location: [facility] ENDOSCOPY;  Service: Gastroenterology;  Laterality: N/A;   BASAL CELL CARCINOMA EXCISION  2001   off my nose, left side   BIOPSY  05/08/2021   Procedure: BIOPSY;  Surgeon: Saintclair Jasper, MD;  Location: [facility] ENDOSCOPY;  Service: Gastroenterology;;   BIOPSY  04/25/2023   Procedure: BIOPSY;  Surgeon: Dianna Specking, MD;  Location: [facility] ENDOSCOPY;  Service: Gastroenterology;;   BLEPHAROPLASTY Bilateral    BONE BIOPSY  10/26/2023   Procedure: BIOPSY, GI;  Surgeon: Elicia Claw, MD;  Location: [facility] ENDOSCOPY;  Service: Gastroenterology;;   CATARACT EXTRACTION W/ INTRAOCULAR LENS  IMPLANT, BILATERAL Bilateral 1990's   COLONOSCOPY N/A 10/26/2023   Procedure: COLONOSCOPY;  Surgeon:  Elicia Claw, MD;  Location: [facility] ENDOSCOPY;  Service: Gastroenterology;  Laterality: N/A;   COLONOSCOPY WITH PROPOFOL  N/A 05/04/2022   Procedure: COLONOSCOPY WITH PROPOFOL ;  Surgeon: Saintclair Jasper, MD;  Location: [facility] ENDOSCOPY;  Service: Gastroenterology;  Laterality: N/A;   COLONOSCOPY WITH PROPOFOL  N/A 04/25/2023   Procedure: COLONOSCOPY WITH PROPOFOL ;  Surgeon: Dianna Specking, MD;  Location: [facility] ENDOSCOPY;  Service: Gastroenterology;  Laterality: N/A;   ENTEROSCOPY N/A 11/20/2023   Procedure: ENTEROSCOPY;  Surgeon: Dianna Specking, MD;  Location: [facility] ENDOSCOPY;  Service: Gastroenterology;  Laterality: N/A;   ESOPHAGOGASTRODUODENOSCOPY N/A 10/26/2023   Procedure: EGD (ESOPHAGOGASTRODUODENOSCOPY);  Surgeon: Elicia Claw, MD;  Location: [facility];  Service: Gastroenterology;  Laterality: N/A;   ESOPHAGOGASTRODUODENOSCOPY N/A 11/20/2023   Procedure: EGD (ESOPHAGOGASTRODUODENOSCOPY);  Surgeon: Dianna Specking, MD;  Location: [facility] ENDOSCOPY;  Service: Gastroenterology;  Laterality: N/A;   ESOPHAGOGASTRODUODENOSCOPY (EGD) WITH PROPOFOL  N/A 05/08/2021   Procedure: ESOPHAGOGASTRODUODENOSCOPY (EGD) WITH PROPOFOL ;  Surgeon: Saintclair Jasper, MD;  Location: [facility] ENDOSCOPY;  Service: Gastroenterology;  Laterality: N/A;   ESOPHAGOGASTRODUODENOSCOPY (EGD) WITH PROPOFOL  N/A 01/11/2022   Procedure: ESOPHAGOGASTRODUODENOSCOPY (EGD) WITH PROPOFOL ;  Surgeon: Rosalie Kitchens, MD;  Location: [facility] ENDOSCOPY;  Service: Gastroenterology;  Laterality: N/A;   ESOPHAGOGASTRODUODENOSCOPY (EGD) WITH PROPOFOL  N/A 01/06/2023   Procedure: ESOPHAGOGASTRODUODENOSCOPY (EGD) WITH PROPOFOL ;  Surgeon: Rosalie Kitchens, MD;  Location: [facility] ENDOSCOPY;  Service: Gastroenterology;  Laterality: N/A;   ESOPHAGOGASTRODUODENOSCOPY (EGD) WITH PROPOFOL  N/A 04/21/2023   Procedure: ESOPHAGOGASTRODUODENOSCOPY (EGD) WITH PROPOFOL ;  Surgeon: Dianna Specking, MD;  Location: [facility] ENDOSCOPY;  Service: Gastroenterology;  Laterality: N/A;   EYE SURGERY Bilateral     to improve vision after cataract OR   GIVENS CAPSULE STUDY N/A 04/21/2023   Procedure: GIVENS CAPSULE STUDY;  Surgeon: Dianna Specking, MD;  Location: [facility] ENDOSCOPY;  Service: Gastroenterology;  Laterality: N/A;   GIVENS CAPSULE STUDY N/A 11/18/2023   Procedure: IMAGING PROCEDURE, GI TRACT, INTRALUMINAL, VIA CAPSULE;  Surgeon: Dianna Specking, MD;  Location: [facility] ENDOSCOPY;  Service: Gastroenterology;  Laterality: N/A;   HEMOSTASIS CLIP PLACEMENT  10/26/2023   Procedure: CONTROL OF HEMORRHAGE, GI TRACT, ENDOSCOPIC, BY CLIPPING OR OVERSEWING;  Surgeon: Elicia Claw, MD;  Location: [facility] ENDOSCOPY;  Service: Gastroenterology;;   IR ANGIOGRAM FOLLOW UP STUDY  12/19/2020   IR ANGIOGRAM SELECTIVE EACH ADDITIONAL VESSEL  12/19/2020   IR ANGIOGRAM SELECTIVE EACH ADDITIONAL VESSEL  12/19/2020   IR ANGIOGRAM VISCERAL SELECTIVE  12/19/2020   IR EMBO ART  VEN HEMORR LYMPH EXTRAV  INC GUIDE ROADMAPPING  12/19/2020   IR US  GUIDE VASC ACCESS RIGHT  12/19/2020   JOINT REPLACEMENT     POLYPECTOMY  05/04/2022   Procedure: POLYPECTOMY;  Surgeon: Saintclair Jasper, MD;  Location: [facility] ENDOSCOPY;  Service: Gastroenterology;;   POLYPECTOMY  10/26/2023   Procedure: POLYPECTOMY, INTESTINE;  Surgeon: Elicia Claw, MD;  Location: [facility] ENDOSCOPY;  Service: Gastroenterology;;   STAPEDES SURGERY Left    scraped stapedes because it was sticking when it wasn't suppose to   TONSILLECTOMY AND ADENOIDECTOMY  1946   TOTAL KNEE ARTHROPLASTY Left ~ 2008   TOTAL KNEE ARTHROPLASTY  12/20/2011  Procedure: TOTAL KNEE ARTHROPLASTY;  Surgeon: Elspeth JONELLE Her, MD;  Location: [facility] OR;  Service: Orthopedics;  Laterality: Right;  Right Total Knee Arthroplasty   TUBAL LIGATION  1980's    Allergies  Allergen Reactions   Morphine And Codeine Itching   Aspirin Other (See Comments)    Unknown reaction   Duloxetine  Hcl Other (See Comments)    Unknown reaction   Tymlos [Abaloparatide] Other (See Comments)    Unknown reaction   Ventolin   [Albuterol ] Other (See Comments)    rapid heart beat   Cefadroxil Hives    Patient can take amoxicillin  and cipro     Outpatient Encounter Medications as of 12/22/2023  Medication Sig   acetaminophen  (TYLENOL ) 500 MG tablet Take 500 mg by mouth daily as needed for moderate pain (pain score 4-6).   acetaminophen  (TYLENOL ) 650 MG CR tablet Take 1,300 mg by mouth in the morning and at bedtime.   albuterol  (VENTOLIN  HFA) 108 (90 Base) MCG/ACT inhaler Inhale 2 puffs into the lungs every 4 (four) hours as needed for wheezing or shortness of breath.   alendronate (FOSAMAX) 70 MG tablet Take 70 mg by mouth every Sunday. Take with a full glass of water on an empty stomach.   Ascorbic Acid  (VITAMIN C ) 1000 MG tablet Take 1,000 mg by mouth every morning.   cetirizine  (ZYRTEC ) 5 MG chewable tablet Chew 5 mg by mouth daily as needed for allergies.   Cholecalciferol  (VITAMIN D3) 50 MCG (2000 UT) TABS Take 2,000 Units by mouth every morning.   cyanocobalamin  (VITAMIN B12) 1000 MCG tablet Take 1,000 mcg by mouth daily.   Dorzolamide  HCl-Timolol  Mal PF 2-0.5 % SOLN Place 1 drop into both eyes in the morning and at bedtime.   ferrous gluconate  (FERGON) 240 (27 FE) MG tablet Take 240 mg by mouth every morning. Take without food   fluticasone  (FLONASE ) 50 MCG/ACT nasal spray Place 1 spray into both nostrils daily as needed for allergies or rhinitis.   guaiFENesin  (ROBITUSSIN) 100 MG/5ML liquid Take 10 mLs by mouth every 4 (four) hours as needed for cough or to loosen phlegm.   latanoprost  (XALATAN ) 0.005 % ophthalmic solution Place 1 drop into both eyes at bedtime.   Multiple Vitamins-Minerals (PRESERVISION AREDS 2) CAPS Take 1 capsule by mouth 2 (two) times daily.   Netarsudil  Dimesylate (RHOPRESSA ) 0.02 % SOLN Place 1 drop into both eyes at bedtime.   oxybutynin  (DITROPAN -XL) 10 MG 24 hr tablet Take 10 mg by mouth at bedtime.   pantoprazole  (PROTONIX ) 40 MG tablet Take 1 tablet (40 mg total) by mouth 2 (two)  times daily before a meal.   polyethylene glycol (MIRALAX  / GLYCOLAX ) 17 g packet Take 8.5 g by mouth daily as needed (constipation).   potassium chloride  SA (KLOR-CON  M) 20 MEQ tablet Take 1 tablet (20 mEq total) by mouth daily.   Probiotic Product (ALIGN) 4 MG CAPS Take 4 mg by mouth at bedtime.   rOPINIRole  (REQUIP ) 0.5 MG tablet Take 1 tablet (0.5 mg total) by mouth daily as needed.   senna (SENOKOT) 8.6 MG TABS tablet Take 2 tablets by mouth at bedtime.   sertraline  (ZOLOFT ) 50 MG tablet Take 1.5 tablets (75 mg total) by mouth daily.   simvastatin  (ZOCOR ) 20 MG tablet Take 20 mg by mouth every evening.   Sodium Fluoride  (PREVIDENT 5000 BOOSTER PLUS) 1.1 % PSTE Place 1 Application onto teeth See admin instructions. Brush on teeth with a toothbrush after evening mouth care. Spit out excess and do  not rinse.   torsemide  (DEMADEX ) 20 MG tablet Take 20 mg by mouth daily.   traMADol HCl 25 MG TABS Take 25 mg by mouth 2 (two) times daily.   umeclidinium-vilanterol (ANORO ELLIPTA ) 62.5-25 MCG/ACT AEPB Inhale 1 puff into the lungs every morning.   zinc oxide 20 % ointment Apply 1 Application topically See admin instructions. Apply topically to buttocks after every incontinent episode and as needed for redness   docusate sodium  (COLACE) 100 MG capsule Take 200 mg by mouth at bedtime. (Patient not taking: Reported on 12/22/2023)   No facility-administered encounter medications on file as of 12/22/2023.    Review of Systems  Constitutional:  Negative for fatigue and fever.  HENT:  Negative for sore throat and trouble swallowing.   Eyes:  Positive for visual disturbance.  Respiratory:  Negative for cough and shortness of breath.   Cardiovascular:  Negative for chest pain and leg swelling.  Gastrointestinal:  Negative for abdominal pain and blood in stool.  Genitourinary:  Positive for frequency. Negative for dysuria, hematuria and urgency.  Musculoskeletal:  Positive for arthralgias and gait problem.   Skin:  Negative for wound.  Neurological:  Positive for weakness. Negative for dizziness, light-headedness and headaches.  Psychiatric/Behavioral:  Positive for dysphoric mood. The patient is not nervous/anxious.     Immunization History  Administered Date(s) Administered   Dtap, Unspecified 03/12/2018   Fluad Quad(high Dose 65+) 04/24/2022   Fluad Trivalent(High Dose 65+) 04/27/2023   Fluzone Influenza virus vaccine,trivalent (IIV3), split virus 09/22/2009, 03/29/2010, 07/23/2011, 04/03/2012, 03/30/2014   Influenza Inj Mdck Quad Pf 04/12/2017   Influenza, High Dose Seasonal PF 04/15/2013, 03/14/2018   Influenza, Mdck, Trivalent,PF 6+ MOS(egg free) 03/16/2016, 04/12/2017   Influenza, Quadrivalent, Recombinant, Inj, Pf 03/05/2019, 05/22/2021   Influenza,inj,Quad PF,6+ Mos 03/30/2014, 03/25/2015   PFIZER(Purple Top)SARS-COV-2 Vaccination 07/25/2019, 08/16/2019, 03/31/2020, 03/31/2021, 12/12/2021   Pneumococcal Conjugate-13 10/19/2013, 11/01/2014   Pneumococcal Polysaccharide-23 09/22/2009, 05/01/2010, 07/23/2011   Pneumococcal-Unspecified 03/02/2011   Td (Adult),5 Lf Tetanus Toxid, Preservative Free 07/23/2011   Tdap 09/22/2009, 07/23/2011, 03/12/2018   Zoster Recombinant(Shingrix) 10/08/2017, 12/09/2017   Zoster, Live 09/22/2009, 07/23/2011, 12/16/2017   Pertinent  Health Maintenance Due  Topic Date Due   DEXA SCAN  Never done   INFLUENZA VACCINE  01/30/2024      10/30/2022   12:44 PM 01/22/2023    1:15 PM 04/04/2023   11:00 AM 07/10/2023    9:02 AM 10/24/2023    2:27 PM  Fall Risk  Falls in the past year? 0 0 0 0 0  Was there an injury with Fall? 0 0 0 0 0  Fall Risk Category Calculator 0 0 0 0 0  Patient at Risk for Falls Due to History of fall(s);Impaired balance/gait;Impaired mobility History of fall(s);Impaired balance/gait History of fall(s);Impaired balance/gait;Impaired mobility History of fall(s);Impaired balance/gait;Impaired mobility History of fall(s);Impaired  balance/gait;Impaired vision  Fall risk Follow up Falls evaluation completed;Education provided;Falls prevention discussed Falls evaluation completed;Education provided;Falls prevention discussed Falls evaluation completed;Education provided;Falls prevention discussed Falls evaluation completed;Education provided;Falls prevention discussed Falls evaluation completed;Education provided   Functional Status Survey:    Vitals:   12/22/23 1122  BP: 110/63  Pulse: 71  Resp: 18  Temp: (!) 96.5 F (35.8 C)  SpO2: 94%  Weight: 163 lb 6.4 oz (74.1 kg)  Height: 5' 1 (1.549 m)   Body mass index is 30.87 kg/m. Physical Exam Vitals reviewed.  Constitutional:      General: She is not in acute distress. HENT:  Head: Normocephalic.   Eyes:     General:        Right eye: No discharge.        Left eye: No discharge.    Cardiovascular:     Rate and Rhythm: Normal rate and regular rhythm.     Pulses: Normal pulses.     Heart sounds: Normal heart sounds.  Pulmonary:     Effort: Pulmonary effort is normal. No respiratory distress.     Breath sounds: Normal breath sounds. No wheezing or rales.  Abdominal:     General: There is no distension.     Palpations: Abdomen is soft.     Tenderness: There is no abdominal tenderness.   Musculoskeletal:     Cervical back: Neck supple.     Right lower leg: No edema.     Left lower leg: No edema.   Skin:    General: Skin is warm.     Capillary Refill: Capillary refill takes less than 2 seconds.   Neurological:     General: No focal deficit present.     Mental Status: She is alert and oriented to person, place, and time.     Gait: Gait abnormal.   Psychiatric:        Mood and Affect: Mood normal.     Labs reviewed: Recent Labs    11/18/23 1025 11/19/23 0302 11/19/23 0904 11/20/23 0254 11/21/23 0310 11/23/23 0650 11/24/23 0541 11/25/23 0508  NA 138   < >  --  141   < > 139 136 134*  K 3.0*   < >  --  3.8   < > 3.6 3.7 3.6  CL  105   < >  --  112*   < > 107 103 105  CO2 25   < >  --  23   < > 24 24 25   GLUCOSE 148*   < >  --  130*   < > 114* 120* 120*  BUN 25*   < >  --  13   < > 11 12 9   CREATININE 0.57   < >  --  <0.30*   < > 0.47 0.48 0.33*  CALCIUM  7.4*   < >  --  7.9*   < > 8.4* 8.3* 7.9*  MG 1.9  --   --  2.0   < > 2.2 2.3 2.2  PHOS 2.6  --  2.2* 1.8*  --   --   --   --    < > = values in this interval not displayed.   Recent Labs    10/24/23 2107 11/18/23 0210 11/19/23 0302  AST 20 15 14*  ALT 10 10 8   ALKPHOS 66 40 29*  BILITOT 0.6 0.7 0.8  PROT 6.6 5.5* 4.5*  ALBUMIN 3.7 3.3* 2.6*   Recent Labs    07/14/23 0000 10/24/23 2107 11/18/23 0210 11/18/23 1025 11/23/23 0650 11/24/23 0541 11/25/23 0508 12/18/23 0000  WBC 4.6   < > 10.3   < > 6.5 6.8 5.5 4.3  NEUTROABS 2,640.00  --  6.9  --   --   --   --  2,137.00  HGB 11.7*   < > 5.3*   < > 8.3* 8.2* 8.1* 11.0*  HCT 37   < > 17.0*   < > 27.8* 26.9* 26.9* 35*  MCV  --    < > 103.7*   < > 104.1* 102.7* 104.7*  --   PLT 156   < >  179   < > 178 189 196 184   < > = values in this interval not displayed.   Lab Results  Component Value Date   TSH 2.47 10/08/2022   No results found for: HGBA1C Lab Results  Component Value Date   CHOL 125 07/14/2023   HDL 51 07/14/2023   LDLCALC 53 07/14/2023   TRIG 130 07/14/2023    Significant Diagnostic Results in last 30 days:  No results found.  Assessment/Plan 1. OAB (overactive bladder) (Primary) - 06/16 increased frequency> drug holiday x 3 days> patient wants to continue medication - pharmacy with anticholinergic concerns - will continue current dose> benefit outweigh risk at this time   2. Acute GI bleeding - hospitalized 05/20-05/27 - hgb 11.0 12/18/2023 - 05/22 EGD noted gastritis and gastric polyps - double balloon enteroscopy recommended if anemia continues - cont PPI  3. Primary osteoarthritis involving multiple joints - stable with Tramadol  4. Recurrent depression (HCC) -  followed by [facility]> seen 06/11> no recommendations> does not want to continue [facility] - no mood changes  - cont Zoloft      Family/ staff Communication: plan discussed with patient and nurse  Labs/tests ordered:  none

## 2023-12-23 DIAGNOSIS — M6281 Muscle weakness (generalized): Secondary | ICD-10-CM | POA: Diagnosis not present

## 2023-12-23 DIAGNOSIS — R41841 Cognitive communication deficit: Secondary | ICD-10-CM | POA: Diagnosis not present

## 2023-12-23 DIAGNOSIS — R2689 Other abnormalities of gait and mobility: Secondary | ICD-10-CM | POA: Diagnosis not present

## 2023-12-25 DIAGNOSIS — M6281 Muscle weakness (generalized): Secondary | ICD-10-CM | POA: Diagnosis not present

## 2023-12-25 DIAGNOSIS — R2689 Other abnormalities of gait and mobility: Secondary | ICD-10-CM | POA: Diagnosis not present

## 2023-12-25 DIAGNOSIS — R41841 Cognitive communication deficit: Secondary | ICD-10-CM | POA: Diagnosis not present

## 2023-12-27 ENCOUNTER — Encounter (HOSPITAL_COMMUNITY): Payer: Self-pay

## 2023-12-27 ENCOUNTER — Other Ambulatory Visit: Payer: Self-pay

## 2023-12-27 ENCOUNTER — Inpatient Hospital Stay (HOSPITAL_COMMUNITY)
Admission: EM | Admit: 2023-12-27 | Discharge: 2024-01-01 | DRG: 357 | Disposition: A | Discharge status: Skilled Nursing Facility | Source: Skilled Nursing Facility | Attending: Internal Medicine | Admitting: Internal Medicine

## 2023-12-27 ENCOUNTER — Emergency Department (HOSPITAL_COMMUNITY)

## 2023-12-27 DIAGNOSIS — D649 Anemia, unspecified: Secondary | ICD-10-CM | POA: Insufficient documentation

## 2023-12-27 DIAGNOSIS — D62 Acute posthemorrhagic anemia: Secondary | ICD-10-CM | POA: Diagnosis not present

## 2023-12-27 DIAGNOSIS — I110 Hypertensive heart disease with heart failure: Secondary | ICD-10-CM | POA: Diagnosis present

## 2023-12-27 DIAGNOSIS — Z87891 Personal history of nicotine dependence: Secondary | ICD-10-CM

## 2023-12-27 DIAGNOSIS — Z66 Do not resuscitate: Secondary | ICD-10-CM | POA: Diagnosis present

## 2023-12-27 DIAGNOSIS — Z8249 Family history of ischemic heart disease and other diseases of the circulatory system: Secondary | ICD-10-CM

## 2023-12-27 DIAGNOSIS — Z8052 Family history of malignant neoplasm of bladder: Secondary | ICD-10-CM

## 2023-12-27 DIAGNOSIS — F411 Generalized anxiety disorder: Secondary | ICD-10-CM | POA: Diagnosis present

## 2023-12-27 DIAGNOSIS — I959 Hypotension, unspecified: Secondary | ICD-10-CM | POA: Diagnosis not present

## 2023-12-27 DIAGNOSIS — Z860100 Personal history of colon polyps, unspecified: Secondary | ICD-10-CM

## 2023-12-27 DIAGNOSIS — R5381 Other malaise: Secondary | ICD-10-CM | POA: Diagnosis present

## 2023-12-27 DIAGNOSIS — G2581 Restless legs syndrome: Secondary | ICD-10-CM | POA: Diagnosis present

## 2023-12-27 DIAGNOSIS — F32A Depression, unspecified: Secondary | ICD-10-CM | POA: Diagnosis present

## 2023-12-27 DIAGNOSIS — H9190 Unspecified hearing loss, unspecified ear: Secondary | ICD-10-CM | POA: Diagnosis present

## 2023-12-27 DIAGNOSIS — J418 Mixed simple and mucopurulent chronic bronchitis: Secondary | ICD-10-CM

## 2023-12-27 DIAGNOSIS — Z79899 Other long term (current) drug therapy: Secondary | ICD-10-CM

## 2023-12-27 DIAGNOSIS — H409 Unspecified glaucoma: Secondary | ICD-10-CM | POA: Diagnosis present

## 2023-12-27 DIAGNOSIS — Z6831 Body mass index (BMI) 31.0-31.9, adult: Secondary | ICD-10-CM

## 2023-12-27 DIAGNOSIS — K922 Gastrointestinal hemorrhage, unspecified: Principal | ICD-10-CM | POA: Diagnosis present

## 2023-12-27 DIAGNOSIS — E66811 Obesity, class 1: Secondary | ICD-10-CM | POA: Diagnosis present

## 2023-12-27 DIAGNOSIS — I701 Atherosclerosis of renal artery: Secondary | ICD-10-CM | POA: Diagnosis not present

## 2023-12-27 DIAGNOSIS — Z85828 Personal history of other malignant neoplasm of skin: Secondary | ICD-10-CM

## 2023-12-27 DIAGNOSIS — I5032 Chronic diastolic (congestive) heart failure: Secondary | ICD-10-CM | POA: Diagnosis present

## 2023-12-27 DIAGNOSIS — K5731 Diverticulosis of large intestine without perforation or abscess with bleeding: Principal | ICD-10-CM | POA: Diagnosis present

## 2023-12-27 DIAGNOSIS — M8580 Other specified disorders of bone density and structure, unspecified site: Secondary | ICD-10-CM | POA: Diagnosis present

## 2023-12-27 DIAGNOSIS — H3530 Unspecified macular degeneration: Secondary | ICD-10-CM | POA: Diagnosis present

## 2023-12-27 DIAGNOSIS — K625 Hemorrhage of anus and rectum: Secondary | ICD-10-CM | POA: Diagnosis not present

## 2023-12-27 DIAGNOSIS — E876 Hypokalemia: Secondary | ICD-10-CM | POA: Diagnosis not present

## 2023-12-27 DIAGNOSIS — R000 Tachycardia, unspecified: Secondary | ICD-10-CM | POA: Diagnosis not present

## 2023-12-27 DIAGNOSIS — K219 Gastro-esophageal reflux disease without esophagitis: Secondary | ICD-10-CM | POA: Diagnosis present

## 2023-12-27 DIAGNOSIS — Z888 Allergy status to other drugs, medicaments and biological substances status: Secondary | ICD-10-CM

## 2023-12-27 DIAGNOSIS — Z886 Allergy status to analgesic agent status: Secondary | ICD-10-CM

## 2023-12-27 DIAGNOSIS — Z885 Allergy status to narcotic agent status: Secondary | ICD-10-CM

## 2023-12-27 DIAGNOSIS — R58 Hemorrhage, not elsewhere classified: Secondary | ICD-10-CM | POA: Diagnosis not present

## 2023-12-27 DIAGNOSIS — E785 Hyperlipidemia, unspecified: Secondary | ICD-10-CM | POA: Diagnosis present

## 2023-12-27 DIAGNOSIS — N281 Cyst of kidney, acquired: Secondary | ICD-10-CM | POA: Diagnosis not present

## 2023-12-27 DIAGNOSIS — E861 Hypovolemia: Secondary | ICD-10-CM | POA: Diagnosis present

## 2023-12-27 DIAGNOSIS — Z881 Allergy status to other antibiotic agents status: Secondary | ICD-10-CM

## 2023-12-27 DIAGNOSIS — Z96653 Presence of artificial knee joint, bilateral: Secondary | ICD-10-CM | POA: Diagnosis present

## 2023-12-27 DIAGNOSIS — Z7983 Long term (current) use of bisphosphonates: Secondary | ICD-10-CM

## 2023-12-27 DIAGNOSIS — I10 Essential (primary) hypertension: Secondary | ICD-10-CM | POA: Diagnosis present

## 2023-12-27 DIAGNOSIS — J449 Chronic obstructive pulmonary disease, unspecified: Secondary | ICD-10-CM | POA: Diagnosis present

## 2023-12-27 LAB — I-STAT CHEM 8, ED
BUN: 9 mg/dL (ref 8–23)
Calcium, Ion: 1.15 mmol/L (ref 1.15–1.40)
Chloride: 101 mmol/L (ref 98–111)
Creatinine, Ser: 0.6 mg/dL (ref 0.44–1.00)
Glucose, Bld: 120 mg/dL — ABNORMAL HIGH (ref 70–99)
HCT: 30 % — ABNORMAL LOW (ref 36.0–46.0)
Hemoglobin: 10.2 g/dL — ABNORMAL LOW (ref 12.0–15.0)
Potassium: 3.1 mmol/L — ABNORMAL LOW (ref 3.5–5.1)
Sodium: 142 mmol/L (ref 135–145)
TCO2: 26 mmol/L (ref 22–32)

## 2023-12-27 LAB — CBC WITH DIFFERENTIAL/PLATELET
Abs Immature Granulocytes: 0.02 K/uL (ref 0.00–0.07)
Basophils Absolute: 0 K/uL (ref 0.0–0.1)
Basophils Relative: 1 %
Eosinophils Absolute: 0.1 K/uL (ref 0.0–0.5)
Eosinophils Relative: 1 %
HCT: 31.2 % — ABNORMAL LOW (ref 36.0–46.0)
Hemoglobin: 9.4 g/dL — ABNORMAL LOW (ref 12.0–15.0)
Immature Granulocytes: 0 %
Lymphocytes Relative: 19 %
Lymphs Abs: 1.1 K/uL (ref 0.7–4.0)
MCH: 30 pg (ref 26.0–34.0)
MCHC: 30.1 g/dL (ref 30.0–36.0)
MCV: 99.7 fL (ref 80.0–100.0)
Monocytes Absolute: 0.6 K/uL (ref 0.1–1.0)
Monocytes Relative: 11 %
Neutro Abs: 4 K/uL (ref 1.7–7.7)
Neutrophils Relative %: 68 %
Platelets: 170 K/uL (ref 150–400)
RBC: 3.13 MIL/uL — ABNORMAL LOW (ref 3.87–5.11)
RDW: 15.8 % — ABNORMAL HIGH (ref 11.5–15.5)
WBC: 5.8 K/uL (ref 4.0–10.5)
nRBC: 0 % (ref 0.0–0.2)

## 2023-12-27 LAB — APTT: aPTT: 30 s (ref 24–36)

## 2023-12-27 LAB — POC OCCULT BLOOD, ED: Fecal Occult Bld: POSITIVE — AB

## 2023-12-27 LAB — COMPREHENSIVE METABOLIC PANEL WITH GFR
ALT: 8 U/L (ref 0–44)
AST: 23 U/L (ref 15–41)
Albumin: 3.5 g/dL (ref 3.5–5.0)
Alkaline Phosphatase: 65 U/L (ref 38–126)
Anion gap: 9 (ref 5–15)
BUN: 11 mg/dL (ref 8–23)
CO2: 28 mmol/L (ref 22–32)
Calcium: 8.4 mg/dL — ABNORMAL LOW (ref 8.9–10.3)
Chloride: 103 mmol/L (ref 98–111)
Creatinine, Ser: 0.52 mg/dL (ref 0.44–1.00)
GFR, Estimated: >60 mL/min (ref >60)
Glucose, Bld: 120 mg/dL — ABNORMAL HIGH (ref 70–99)
Potassium: 3.4 mmol/L — ABNORMAL LOW (ref 3.5–5.1)
Sodium: 140 mmol/L (ref 135–145)
Total Bilirubin: 0.8 mg/dL (ref 0.0–1.2)
Total Protein: 6.1 g/dL — ABNORMAL LOW (ref 6.5–8.1)

## 2023-12-27 LAB — HEMOGLOBIN AND HEMATOCRIT, BLOOD
HCT: 27.2 % — ABNORMAL LOW (ref 36.0–46.0)
HCT: 21.2 % — ABNORMAL LOW (ref 36.0–46.0)
Hemoglobin: 8.3 g/dL — ABNORMAL LOW (ref 12.0–15.0)
Hemoglobin: 6.4 g/dL — CL (ref 12.0–15.0)

## 2023-12-27 LAB — PROTIME-INR
INR: 1.2 (ref 0.8–1.2)
Prothrombin Time: 15.4 s — ABNORMAL HIGH (ref 11.4–15.2)

## 2023-12-27 LAB — PREPARE RBC (CROSSMATCH)

## 2023-12-27 MED ORDER — SODIUM CHLORIDE 0.9% FLUSH
3.0000 mL | Freq: Two times a day (BID) | INTRAVENOUS | Status: DC
  Administered 2023-12-27: 3 mL via INTRAVENOUS
  Administered 2023-12-28 – 2024-01-01 (×9): 10 mL via INTRAVENOUS

## 2023-12-27 MED ORDER — LACTATED RINGERS IV BOLUS
1000.0000 mL | Freq: Once | INTRAVENOUS | Status: AC
  Administered 2023-12-27: 1000 mL via INTRAVENOUS

## 2023-12-27 MED ORDER — ACETAMINOPHEN 650 MG RE SUPP: 650.0000 mg | Freq: Four times a day (QID) | RECTAL | Status: DC | PRN

## 2023-12-27 MED ORDER — BISACODYL 5 MG PO TBEC: 10.0000 mg | DELAYED_RELEASE_TABLET | Freq: Once | ORAL | Status: DC

## 2023-12-27 MED ORDER — SODIUM CHLORIDE 0.9% FLUSH: 3.0000 mL | INTRAVENOUS | Status: DC | PRN

## 2023-12-27 MED ORDER — ACETAMINOPHEN 325 MG PO TABS
650.0000 mg | ORAL_TABLET | Freq: Four times a day (QID) | ORAL | Status: DC | PRN
  Administered 2023-12-31: 650 mg via ORAL
  Filled 2023-12-27: qty 2

## 2023-12-27 MED ORDER — ONDANSETRON HCL 4 MG/2ML IJ SOLN: 4.0000 mg | Freq: Four times a day (QID) | INTRAMUSCULAR | Status: DC | PRN

## 2023-12-27 MED ORDER — SODIUM CHLORIDE 0.9% IV SOLUTION: Freq: Once | INTRAVENOUS | Status: DC

## 2023-12-27 MED ORDER — PEG 3350-KCL-NA BICARB-NACL 420 G PO SOLR
4000.0000 mL | Freq: Once | ORAL | Status: DC
  Filled 2023-12-27: qty 4000

## 2023-12-27 MED ORDER — IOHEXOL 350 MG/ML SOLN
100.0000 mL | Freq: Once | INTRAVENOUS | Status: AC | PRN
  Administered 2023-12-27: 100 mL via INTRAVENOUS

## 2023-12-27 MED ORDER — LACTATED RINGERS IV SOLN: INTRAVENOUS | Status: DC

## 2023-12-27 NOTE — ED Provider Notes (Signed)
  EMERGENCY DEPARTMENT AT [facility] Provider Note   CSN: [id number] Arrival date & time: 12/27/23  9060     Patient presents with: Rectal Bleeding   [name] is a 83 y.o. female.   Patient is a 83 year old female who presents with lower GI bleeding.  She has a history of CHF, hypertension, hyperlipidemia, COPD, prior diverticular bleed status post embolization as well as prior upper GI bleed.  She was most recently admitted in May for hematochezia.  She had an endoscopy which was nonrevealing.  She was seen by [facility] gastroenterology.  She comes in today with bright red blood per rectum.  She has had multiple episodes this morning.  She feels a little weak.  No shortness of breath or dizziness.  No associated abdominal pain.  No vomiting.  No fevers.  GI note from last admission: Obscure GI bleeding - unrevealing EGD and enteroscopy yesterday. If bleeding recurs or anemia significantly worsens then will need referral to tertiary care center for single or double balloon enteroscopy otherwise manage conservatively. Will sign off. Call if questions. Dr. Burnette available this weekend if needed.  GI note from inpatient colonoscopy 4/27  EGD showed mild gastritis and few likely fundic gland gastric polyps. - Colonoscopy showed evidence of likely ischemic colitis in the cecum and small area of proximal ascending colon.  Biopsy performed.  She had 20 mm proximal ascending colon which was removed with a hot snare.  1 clip placed.  Another small polyp at hepatic flexure removed with hot snare.  Diverticulosis and hemorrhoids.          Prior to Admission medications   Medication Sig Start Date End Date Taking? Authorizing Provider  acetaminophen  (TYLENOL ) 500 MG tablet Take 500 mg by mouth daily as needed for moderate pain (pain score 4-6).    [provider]  acetaminophen  (TYLENOL ) 650 MG CR tablet Take 1,300 mg by mouth in the morning and at bedtime.     [provider]  albuterol  (VENTOLIN  HFA) 108 (90 Base) MCG/ACT inhaler Inhale 2 puffs into the lungs every 4 (four) hours as needed for wheezing or shortness of breath.    [provider]  alendronate (FOSAMAX) 70 MG tablet Take 70 mg by mouth every Sunday. Take with a full glass of water on an empty stomach.    [provider]  Ascorbic Acid  (VITAMIN C ) 1000 MG tablet Take 1,000 mg by mouth every morning.    [provider]  cetirizine  (ZYRTEC ) 5 MG chewable tablet Chew 5 mg by mouth daily as needed for allergies.    [provider]  Cholecalciferol  (VITAMIN D3) 50 MCG (2000 UT) TABS Take 2,000 Units by mouth every morning.    [provider]  cyanocobalamin  (VITAMIN B12) 1000 MCG tablet Take 1,000 mcg by mouth daily.    [provider]  docusate sodium  (COLACE) 100 MG capsule Take 200 mg by mouth at bedtime. Patient not taking: Reported on 12/22/2023    [provider]  Dorzolamide  HCl-Timolol  Mal PF 2-0.5 % SOLN Place 1 drop into both eyes in the morning and at bedtime.    [provider]  ferrous gluconate  (FERGON) 240 (27 FE) MG tablet Take 240 mg by mouth every morning. Take without food    [provider]  fluticasone  (FLONASE ) 50 MCG/ACT nasal spray Place 1 spray into both nostrils daily as needed for allergies or rhinitis.    [provider]  guaiFENesin  (ROBITUSSIN)  100 MG/5ML liquid Take 10 mLs by mouth every 4 (four) hours as needed for cough or to loosen phlegm. 04/28/23   Rai, Ripudeep MARLA, MD  latanoprost  (XALATAN ) 0.005 % ophthalmic solution Place 1 drop into both eyes at bedtime.    [provider]  Multiple Vitamins-Minerals (PRESERVISION AREDS 2) CAPS Take 1 capsule by mouth 2 (two) times daily.    [provider]  Netarsudil  Dimesylate (RHOPRESSA ) 0.02 % SOLN Place 1 drop into both eyes at bedtime.    [provider]  oxybutynin  (DITROPAN -XL) 10 MG 24 hr  tablet Take 10 mg by mouth at bedtime.    [provider]  pantoprazole  (PROTONIX ) 40 MG tablet Take 1 tablet (40 mg total) by mouth 2 (two) times daily before a meal. 11/24/23   Amin, Ankit C, MD  polyethylene glycol (MIRALAX  / GLYCOLAX ) 17 g packet Take 8.5 g by mouth daily as needed (constipation).    [provider]  potassium chloride  SA (KLOR-CON  M) 20 MEQ tablet Take 1 tablet (20 mEq total) by mouth daily. 11/26/23   Hongalgi, Anand D, MD  Probiotic Product (ALIGN) 4 MG CAPS Take 4 mg by mouth at bedtime.    [provider]  rOPINIRole  (REQUIP ) 0.5 MG tablet Take 1 tablet (0.5 mg total) by mouth daily as needed. 04/30/23   [name], [name], NP  senna (SENOKOT) 8.6 MG TABS tablet Take 2 tablets by mouth at bedtime.    [provider]  sertraline  (ZOLOFT ) 50 MG tablet Take 1.5 tablets (75 mg total) by mouth daily. 01/07/23   Samtani, Jai-Gurmukh, MD  simvastatin  (ZOCOR ) 20 MG tablet Take 20 mg by mouth every evening.    [provider]  Sodium Fluoride  (PREVIDENT 5000 BOOSTER PLUS) 1.1 % PSTE Place 1 Application onto teeth See admin instructions. Brush on teeth with a toothbrush after evening mouth care. Spit out excess and do not rinse.    [provider]  torsemide  (DEMADEX ) 20 MG tablet Take 20 mg by mouth daily.    [provider]  traMADol  HCl 25 MG TABS Take 25 mg by mouth 2 (two) times daily. 12/22/23   [name], [name], NP  umeclidinium-vilanterol (ANORO ELLIPTA ) 62.5-25 MCG/ACT AEPB Inhale 1 puff into the lungs every morning.    [provider]  zinc oxide 20 % ointment Apply 1 Application topically See admin instructions. Apply topically to buttocks after every incontinent episode and as needed for redness    [provider]    Allergies: Morphine and codeine, Aspirin, Duloxetine  hcl, Tymlos [abaloparatide], Ventolin  [albuterol ], and Cefadroxil    Review of Systems  Constitutional:  Positive for fatigue. Negative  for chills, diaphoresis and fever.  HENT:  Negative for congestion, rhinorrhea and sneezing.   Eyes: Negative.   Respiratory:  Negative for cough, chest tightness and shortness of breath.   Cardiovascular:  Negative for chest pain and leg swelling.  Gastrointestinal:  Positive for anal bleeding. Negative for abdominal pain, blood in stool, diarrhea, nausea and vomiting.  Genitourinary:  Negative for difficulty urinating, flank pain, frequency and hematuria.  Musculoskeletal:  Negative for arthralgias and back pain.  Skin:  Negative for rash.  Neurological:  Negative for dizziness, speech difficulty, weakness, numbness and headaches.    Updated Vital Signs BP (!) 126/57 (BP Location: Left Arm)   Pulse 95   Temp 97.6 F (36.4 C) (Oral)   Resp 19   Ht 5' 1 (1.549 m)   Wt 75 kg  SpO2 95%   BMI 31.24 kg/m   Physical Exam Constitutional:      Appearance: She is well-developed.  HENT:     Head: Normocephalic and atraumatic.   Eyes:     Pupils: Pupils are equal, round, and reactive to light.    Cardiovascular:     Rate and Rhythm: Normal rate and regular rhythm.     Heart sounds: Normal heart sounds.  Pulmonary:     Effort: Pulmonary effort is normal. No respiratory distress.     Breath sounds: Normal breath sounds. No wheezing or rales.  Chest:     Chest wall: No tenderness.  Abdominal:     General: Bowel sounds are normal.     Palpations: Abdomen is soft.     Tenderness: There is no abdominal tenderness. There is no guarding or rebound.  Genitourinary:    Comments: Bright red blood noted on rectal exam  Musculoskeletal:        General: Normal range of motion.     Cervical back: Normal range of motion and neck supple.  Lymphadenopathy:     Cervical: No cervical adenopathy.   Skin:    General: Skin is warm and dry.     Findings: No rash.   Neurological:     Mental Status: She is alert and oriented to person, place, and time.     (all labs ordered are listed,  but only abnormal results are displayed) Labs Reviewed  POC OCCULT BLOOD, ED - Abnormal; Notable for the following components:      Result Value   Fecal Occult Bld POSITIVE (*)    All other components within normal limits  COMPREHENSIVE METABOLIC PANEL WITH GFR  CBC WITH DIFFERENTIAL/PLATELET  I-STAT CHEM 8, ED  TYPE AND SCREEN    EKG: None  Radiology: No results found.   Procedures   Medications Ordered in the ED - No data to display  Clinical Course as of 12/27/23 1603  Sat Dec 27, 2023  1523 [place] [MK]    Clinical Course User Index [MK] Albertina Dixon, MD                                 Medical Decision Making Amount and/or Complexity of Data Reviewed Labs: ordered. Radiology: ordered.  Risk Prescription drug management.   12:44 discussed with Dr. Kriss with GI, she recommends CTA abdomen and admission to medicine service.  She feels that pt can be admitted to our facility for now.  This patient presents to the ED for concern of bright red blood per rectum, this involves an extensive number of treatment options, and is a complaint that carries with it a high risk of complications and morbidity.  I considered the following differential and admission for this acute, potentially life threatening condition.  The differential diagnosis includes GI bleed, ischemic colitis, diverticulitis  MDM:    Patient presents with lower GI bleeding.  Her vital signs are stable.  Her hemoglobin is 10.  Discussed with GI who recommends CTA of her abdomen.  This is pending.  Care turned over to Dr. Delwyn pending CTA and then will need admission to the hospitalist service.  (Labs, imaging, consults)  Labs: I Ordered, and personally interpreted labs.  The pertinent results include: Hemoglobin okay at this point  Imaging Studies ordered: I ordered imaging studies including CT abdomen pelvis Additional history obtained from chart review.  External records from outside source  obtained and reviewed including prior notes  Cardiac Monitoring: The patient was maintained on a cardiac monitor.  If on the cardiac monitor, I personally viewed and interpreted the cardiac monitored which showed an underlying rhythm of:    Reevaluation: After the interventions noted above, I reevaluated the patient and found that they have :stayed the same  Social Determinants of Health:    Disposition: Admit to hospital  Co morbidities that complicate the patient evaluation  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

## 2023-12-27 NOTE — Consult Note (Signed)
 NAME:  [name], MRN:  [id number], DOB:  August 24, 1940, LOS: 0 ADMISSION DATE:  12/27/2023, CONSULTATION DATE:  12/27/2023 REFERRING MD:  Lum Passe, MD, CHIEF COMPLAINT:  Lower GI Bleed  History of Present Illness:  83 y/o female with PMH for CHF, HTN, HLD, COPD not on O2, h/o diverticular bleed s/p embolization being followed by [name] who presented with GI bleeding.  She says around 4pm today she had a BM which was bloody with bright red blood.  She had other Bms and was transported from her assisted living to ED.  Her Hbg noted to drop from 10.2 to 8.3, HR 120sto 140s and sinus, K+ 3.1, CT abd showing: IMPRESSION: 1. Positive for active GI bleeding involving the descending colon. Findings are most compatible with a diverticular bleed. 2. 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 3. Extensive diverticular disease throughout the colon. 4. Gallbladder sludge or small stones. No gallbladder inflammation. 5. Aortic Atherosclerosis (ICD10-I70.0).  Pertinent  Medical History  Anxiety, Arthritis, BPPV (benign paroxysmal positional vertigo), Depression, GERD (gastroesophageal reflux disease), Glaucoma, both eyes, Headache, History of blood transfusion (~ 2008), Hyperlipemia, Lower GI bleeding (~ 2008; 09/05/2017), Macular degeneration, bilateral, Osteopenia, Seasonal allergies, Skin cancer, basal cell (2001), Sleeping excessive, and Tinnitus of both ears.  Significant Hospital Events: Including procedures, antibiotic start and stop dates in addition to other pertinent events   6/28: Admit to Stepdown  Interim History / Subjective:  N/a  Objective    Blood pressure 97/63, pulse (!) 133, temperature 97.6 F (36.4 C), resp. rate (!) 23, height 5' 1 (1.549 m), weight 75 kg, SpO2 95%.        Intake/Output Summary (Last 24 hours) at 12/27/2023 2038 Last data filed at 12/27/2023 1900 Gross per 24 hour  Intake 1000 ml  Output --  Net 1000 ml    Filed Weights   12/27/23 0947  Weight: 75 kg    Examination: General: alert awake NAD HENT: PERRLA no icterus EOMI Lungs: CTA no wheezes no rales Cardiovascular: reg tachy s1s2 no murmurs Abdomen: soft nt nd bs pos Extremities: no edema clubbing or cyanosis Neuro: AAO x 3, CN II to XII grossly intact  Resolved problem list   Assessment and Plan  Acute Lower Bleed Diverticular bleeding ABLA Sinus tachycardia Hypovolemia  Currently not on pressors or drips, has not received blood and last BM 3 hours ago.  She awaits IR from [name] for possible embolization.  GI on board.  At this time she can be admitted to Step down unit.   Monitor H/H q4-6hrs Type and cross, transfuse below 7 hgb or acute bleed with hypotension GI to continue to follow Please call PCCM if condition changes I spoke to her husband and the patient's son is a Development worker, international aid, Doriann Zuch [phone] if condition changes.  Best Practice (right click and Reselect all SmartList Selections daily)   Diet/type: NPO DVT prophylaxis SCD Pressure ulcer(s): N/A GI prophylaxis: H2B Lines: N/A Foley:  N/A Code Status:  limited, ok with pressors, non-invasive ventilation   Labs   CBC: Recent Labs  Lab 12/27/23 1130 12/27/23 1139 12/27/23 1753  WBC 5.8  --   --   NEUTROABS 4.0  --   --   HGB 9.4* 10.2* 8.3*  HCT 31.2* 30.0* 27.2*  MCV 99.7  --   --   PLT 170  --   --     Basic Metabolic Panel: Recent Labs  Lab 12/27/23 1130 12/27/23  1139  NA 140 142  K 3.4* 3.1*  CL 103 101  CO2 28  --   GLUCOSE 120* 120*  BUN 11 9  CREATININE 0.52 0.60  CALCIUM  8.4*  --    GFR: Estimated Creatinine Clearance: 47.6 mL/min (by C-G formula based on SCr of 0.6 mg/dL). Recent Labs  Lab 12/27/23 1130  WBC 5.8    Liver Function Tests: Recent Labs  Lab 12/27/23 1130  AST 23  ALT 8  ALKPHOS 65  BILITOT 0.8  PROT 6.1*  ALBUMIN 3.5   No results for input(s): LIPASE, AMYLASE in the last 168  hours. No results for input(s): AMMONIA in the last 168 hours.  ABG    Component Value Date/Time   PHART 7.341 (L) 05/04/2021 2049   PCO2ART 61.4 (H) 05/04/2021 2049   PO2ART 73 (L) 05/04/2021 2049   HCO3 32.9 (H) 05/04/2021 2049   TCO2 26 12/27/2023 1139   O2SAT 92.0 05/04/2021 2049     Coagulation Profile: Recent Labs  Lab 12/27/23 1753  INR 1.2    Cardiac Enzymes: No results for input(s): CKTOTAL, CKMB, CKMBINDEX, TROPONINI in the last 168 hours.  HbA1C: No results found for: HGBA1C  CBG: No results for input(s): GLUCAP in the last 168 hours.  Review of Systems:   BRBPR, no n/v/d, no vomiting blood, no abd pain no chest pain, no fever/chills/sweats  Past Medical History:  She,  has a past medical history of Anxiety, Arthritis, BPPV (benign paroxysmal positional vertigo), Depression, GERD (gastroesophageal reflux disease), Glaucoma, both eyes, Headache, History of blood transfusion (~ 2008), Hyperlipemia, Lower GI bleeding (~ 2008; 09/05/2017), Macular degeneration, bilateral, Osteopenia, Seasonal allergies, Skin cancer, basal cell (2001), Sleeping excessive, and Tinnitus of both ears.   Surgical History:   Past Surgical History:  Procedure Laterality Date   BALLOON DILATION N/A 05/08/2021   Procedure: BALLOON DILATION;  Surgeon: Saintclair Jasper, MD;  Location: [facility] ENDOSCOPY;  Service: Gastroenterology;  Laterality: N/A;   BASAL CELL CARCINOMA EXCISION  2001   off my nose, left side   BIOPSY  05/08/2021   Procedure: BIOPSY;  Surgeon: Saintclair Jasper, MD;  Location: [facility] ENDOSCOPY;  Service: Gastroenterology;;   BIOPSY  04/25/2023   Procedure: BIOPSY;  Surgeon: Dianna Specking, MD;  Location: [facility] ENDOSCOPY;  Service: Gastroenterology;;   BLEPHAROPLASTY Bilateral    BONE BIOPSY  10/26/2023   Procedure: BIOPSY, GI;  Surgeon: Elicia Claw, MD;  Location: [facility] ENDOSCOPY;  Service: Gastroenterology;;   CATARACT EXTRACTION W/ INTRAOCULAR LENS  IMPLANT, BILATERAL  Bilateral 1990's   COLONOSCOPY N/A 10/26/2023   Procedure: COLONOSCOPY;  Surgeon: Elicia Claw, MD;  Location: [facility] ENDOSCOPY;  Service: Gastroenterology;  Laterality: N/A;   COLONOSCOPY WITH PROPOFOL  N/A 05/04/2022   Procedure: COLONOSCOPY WITH PROPOFOL ;  Surgeon: Saintclair Jasper, MD;  Location: [facility] ENDOSCOPY;  Service: Gastroenterology;  Laterality: N/A;   COLONOSCOPY WITH PROPOFOL  N/A 04/25/2023   Procedure: COLONOSCOPY WITH PROPOFOL ;  Surgeon: Dianna Specking, MD;  Location: [facility] ENDOSCOPY;  Service: Gastroenterology;  Laterality: N/A;   ENTEROSCOPY N/A 11/20/2023   Procedure: ENTEROSCOPY;  Surgeon: Dianna Specking, MD;  Location: [facility] ENDOSCOPY;  Service: Gastroenterology;  Laterality: N/A;   ESOPHAGOGASTRODUODENOSCOPY N/A 10/26/2023   Procedure: EGD (ESOPHAGOGASTRODUODENOSCOPY);  Surgeon: Elicia Claw, MD;  Location: [facility] ENDOSCOPY;  Service: Gastroenterology;  Laterality: N/A;   ESOPHAGOGASTRODUODENOSCOPY N/A 11/20/2023   Procedure: EGD (ESOPHAGOGASTRODUODENOSCOPY);  Surgeon: Dianna Specking, MD;  Location: [facility] ENDOSCOPY;  Service: Gastroenterology;  Laterality: N/A;   ESOPHAGOGASTRODUODENOSCOPY (EGD) WITH PROPOFOL  N/A 05/08/2021   Procedure: ESOPHAGOGASTRODUODENOSCOPY (  EGD) WITH PROPOFOL ;  Surgeon: Saintclair Jasper, MD;  Location: [facility] ENDOSCOPY;  Service: Gastroenterology;  Laterality: N/A;   ESOPHAGOGASTRODUODENOSCOPY (EGD) WITH PROPOFOL  N/A 01/11/2022   Procedure: ESOPHAGOGASTRODUODENOSCOPY (EGD) WITH PROPOFOL ;  Surgeon: Rosalie Kitchens, MD;  Location: [facility] ENDOSCOPY;  Service: Gastroenterology;  Laterality: N/A;   ESOPHAGOGASTRODUODENOSCOPY (EGD) WITH PROPOFOL  N/A 01/06/2023   Procedure: ESOPHAGOGASTRODUODENOSCOPY (EGD) WITH PROPOFOL ;  Surgeon: Rosalie Kitchens, MD;  Location: [facility] ENDOSCOPY;  Service: Gastroenterology;  Laterality: N/A;   ESOPHAGOGASTRODUODENOSCOPY (EGD) WITH PROPOFOL  N/A 04/21/2023   Procedure: ESOPHAGOGASTRODUODENOSCOPY (EGD) WITH PROPOFOL ;  Surgeon: Dianna Specking, MD;  Location: [facility]  ENDOSCOPY;  Service: Gastroenterology;  Laterality: N/A;   EYE SURGERY Bilateral    to improve vision after cataract OR   GIVENS CAPSULE STUDY N/A 04/21/2023   Procedure: GIVENS CAPSULE STUDY;  Surgeon: Dianna Specking, MD;  Location: [facility] ENDOSCOPY;  Service: Gastroenterology;  Laterality: N/A;   GIVENS CAPSULE STUDY N/A 11/18/2023   Procedure: IMAGING PROCEDURE, GI TRACT, INTRALUMINAL, VIA CAPSULE;  Surgeon: Dianna Specking, MD;  Location: [facility] ENDOSCOPY;  Service: Gastroenterology;  Laterality: N/A;   HEMOSTASIS CLIP PLACEMENT  10/26/2023   Procedure: CONTROL OF HEMORRHAGE, GI TRACT, ENDOSCOPIC, BY CLIPPING OR OVERSEWING;  Surgeon: Elicia Claw, MD;  Location: [facility] ENDOSCOPY;  Service: Gastroenterology;;   IR ANGIOGRAM FOLLOW UP STUDY  12/19/2020   IR ANGIOGRAM SELECTIVE EACH ADDITIONAL VESSEL  12/19/2020   IR ANGIOGRAM SELECTIVE EACH ADDITIONAL VESSEL  12/19/2020   IR ANGIOGRAM VISCERAL SELECTIVE  12/19/2020   IR EMBO ART  VEN HEMORR LYMPH EXTRAV  INC GUIDE ROADMAPPING  12/19/2020   IR US  GUIDE VASC ACCESS RIGHT  12/19/2020   JOINT REPLACEMENT     POLYPECTOMY  05/04/2022   Procedure: POLYPECTOMY;  Surgeon: Saintclair Jasper, MD;  Location: [facility] ENDOSCOPY;  Service: Gastroenterology;;   POLYPECTOMY  10/26/2023   Procedure: POLYPECTOMY, INTESTINE;  Surgeon: Elicia Claw, MD;  Location: [facility] ENDOSCOPY;  Service: Gastroenterology;;   STAPEDES SURGERY Left    scraped stapedes because it was sticking when it wasn't suppose to   TONSILLECTOMY AND ADENOIDECTOMY  1946   TOTAL KNEE ARTHROPLASTY Left ~ 2008   TOTAL KNEE ARTHROPLASTY  12/20/2011   Procedure: TOTAL KNEE ARTHROPLASTY;  Surgeon: Elspeth JONELLE Her, MD;  [facility];  Service: Orthopedics;  Laterality: Right;  Right Total Knee Arthroplasty   TUBAL LIGATION  1980's     Social History:   reports that she quit smoking about 31 years ago. Her smoking use included cigarettes. She started smoking about 68 years ago. She has a 27.8 pack-year  smoking history. She has never been exposed to tobacco smoke. She has never used smokeless tobacco. She reports current alcohol  use of about 7.0 standard drinks of alcohol  per week. She reports that she does not use drugs.   Family History:  Her family history includes Bladder Cancer in her father; Heart attack in her mother; Heart disease in her father; Heart failure in her father; Hypertension in her sister; Supraventricular tachycardia in her sister.   Allergies Allergies  Allergen Reactions   Morphine And Codeine Itching   Aspirin Other (See Comments)    Unknown reaction   Duloxetine  Hcl Other (See Comments)    Unknown reaction   Tymlos [Abaloparatide] Other (See Comments)    Unknown reaction   Ventolin  [Albuterol ] Other (See Comments)    rapid heart beat   Cefadroxil Hives    Patient can take amoxicillin  and cipro      Home Medications  Prior to Admission medications   Medication  Sig Start Date End Date Taking? Authorizing Provider  acetaminophen  (TYLENOL ) 650 MG CR tablet Take 1,300 mg by mouth in the morning and at bedtime.   Yes [provider]  albuterol  (VENTOLIN  HFA) 108 (90 Base) MCG/ACT inhaler Inhale 2 puffs into the lungs every 4 (four) hours as needed for wheezing or shortness of breath.   Yes [provider]  alendronate (FOSAMAX) 70 MG tablet Take 70 mg by mouth every Sunday. Take with a full glass of water on an empty stomach.   Yes [provider]  Ascorbic Acid  (VITAMIN C ) 1000 MG tablet Take 1,000 mg by mouth every morning.   Yes [provider]  cetirizine  (ZYRTEC ) 5 MG chewable tablet Chew 5 mg by mouth daily as needed for allergies.   Yes [provider]  Cholecalciferol  (VITAMIN D3) 50 MCG (2000 UT) TABS Take 2,000 Units by mouth every morning.   Yes [provider]  cyanocobalamin  (VITAMIN B12) 1000 MCG tablet Take 1,000 mcg by mouth daily.   Yes [provider]  Dorzolamide  HCl-Timolol  Mal PF  2-0.5 % SOLN Place 1 drop into both eyes in the morning and at bedtime.   Yes [provider]  ferrous gluconate  (FERGON) 240 (27 FE) MG tablet Take 240 mg by mouth every morning. Take without food   Yes [provider]  latanoprost  (XALATAN ) 0.005 % ophthalmic solution Place 1 drop into both eyes at bedtime.   Yes [provider]  Multiple Vitamins-Minerals (PRESERVISION AREDS 2) CAPS Take 1 capsule by mouth 2 (two) times daily.   Yes [provider]  Netarsudil  Dimesylate (RHOPRESSA ) 0.02 % SOLN Place 1 drop into both eyes at bedtime.   Yes [provider]  oxybutynin  (DITROPAN -XL) 10 MG 24 hr tablet Take 10 mg by mouth at bedtime.   Yes [provider]  pantoprazole  (PROTONIX ) 40 MG tablet Take 1 tablet (40 mg total) by mouth 2 (two) times daily before a meal. 11/24/23  Yes Amin, Ankit C, MD  potassium chloride  20 MEQ/15ML (10%) SOLN Take 15 mLs by mouth daily.   Yes [provider]  Probiotic Product (ALIGN) 4 MG CAPS Take 4 mg by mouth at bedtime.   Yes [provider]  rOPINIRole  (REQUIP ) 0.5 MG tablet Take 1 tablet (0.5 mg total) by mouth daily as needed. 04/30/23  Yes [name], [name], NP  senna (SENOKOT) 8.6 MG TABS tablet Take 2 tablets by mouth at bedtime.   Yes [provider]  sertraline  (ZOLOFT ) 50 MG tablet Take 1.5 tablets (75 mg total) by mouth daily. 01/07/23  Yes Samtani, Jai-Gurmukh, MD  simvastatin  (ZOCOR ) 20 MG tablet Take 20 mg by mouth every evening.   Yes [provider]  Sodium Fluoride  (PREVIDENT 5000 BOOSTER PLUS) 1.1 % PSTE Place 1 Application onto teeth See admin instructions. Brush on teeth with a toothbrush after evening mouth care. Spit out excess and do not rinse.   Yes [provider]  torsemide  (DEMADEX ) 20 MG tablet Take 20 mg by mouth daily.   Yes [provider]  traMADol  HCl 25 MG TABS Take 25 mg by mouth 2 (two) times daily. 12/22/23  Yes [name], [name], NP   umeclidinium-vilanterol (ANORO ELLIPTA ) 62.5-25 MCG/ACT AEPB Inhale 1 puff into the lungs every morning.   Yes [provider]  docusate sodium  (COLACE) 100 MG capsule Take 200 mg by mouth at bedtime. Patient not taking: No sig reported    [provider]  potassium chloride   SA (KLOR-CON  M) 20 MEQ tablet Take 1 tablet (20 mEq total) by mouth daily. Patient not taking: Reported on 12/27/2023 11/26/23   Judeth Trenda BIRCH, MD     Critical care time: 75   The patient is critically ill with multiple organ system failure and requires high complexity decision making for assessment and support, frequent evaluation and titration of therapies, advanced monitoring, review of radiographic studies and interpretation of complex data.   Critical Care Time devoted to patient care services, exclusive of separately billable procedures, described in this note is 35 minutes.   Orlin Fairly, MD [facility] See Amion for pager  If no response to pager , please call [phone] until 7pm After 7:00 pm call Elink  [phone] 12/27/2023, 8:38 PM

## 2023-12-27 NOTE — H&P (Signed)
 History and Physical      [name] FMW:[id number] DOB: Jun 12, 1941 DOA: 12/27/2023; DOS: 12/27/2023  PCP: Gil Greig BRAVO, NP *** Patient coming from: home ***  I have personally briefly reviewed patient's old medical records in [facility]  Chief Complaint: ***  HPI: [name] is a 83 y.o. female with medical history significant for *** who is admitted to [facility] on 12/27/2023 with *** after presenting from home*** to [facility] ED complaining of ***.   ***        ***  ED Course:  Vital signs in the ED were notable for the following: ***  Labs were notable for the following: ***  Per my interpretation, EKG in ED demonstrated the following:  ***  Imaging in the ED, per corresponding formal radiology read, was notable for the following: ***  While in the ED, the following were administered: ***  Subsequently, the patient was admitted  ***  ***red   Review of Systems: As per HPI otherwise 10 point review of systems negative.   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, left side   Sleeping excessive    Tinnitus of both ears     Past Surgical History:  Procedure Laterality Date   BALLOON DILATION N/A 05/08/2021   Procedure: BALLOON DILATION;  Surgeon: Saintclair Jasper, MD;  Location: [facility] ENDOSCOPY;  Service: Gastroenterology;  Laterality: N/A;   BASAL CELL CARCINOMA EXCISION  2001   off my nose, left side   BIOPSY   05/08/2021   Procedure: BIOPSY;  Surgeon: Saintclair Jasper, MD;  Location: [facility] ENDOSCOPY;  Service: Gastroenterology;;   BIOPSY  04/25/2023   Procedure: BIOPSY;  Surgeon: Dianna Specking, MD;  Location: [facility] ENDOSCOPY;  Service: Gastroenterology;;   BLEPHAROPLASTY Bilateral    BONE BIOPSY  10/26/2023   Procedure: BIOPSY, GI;  Surgeon: Elicia Claw, MD;  Location: [facility] ENDOSCOPY;  Service: Gastroenterology;;   CATARACT EXTRACTION W/ INTRAOCULAR LENS  IMPLANT, BILATERAL Bilateral 1990's   COLONOSCOPY N/A 10/26/2023   Procedure: COLONOSCOPY;  Surgeon: Elicia Claw, MD;  Location: [facility] ENDOSCOPY;  Service: Gastroenterology;  Laterality: N/A;   COLONOSCOPY WITH PROPOFOL  N/A 05/04/2022   Procedure: COLONOSCOPY WITH PROPOFOL ;  Surgeon: Saintclair Jasper, MD;  Location: [facility] ENDOSCOPY;  Service: Gastroenterology;  Laterality: N/A;   COLONOSCOPY WITH PROPOFOL  N/A 04/25/2023   Procedure: COLONOSCOPY WITH PROPOFOL ;  Surgeon: Dianna Specking, MD;  Location: [facility] ENDOSCOPY;  Service: Gastroenterology;  Laterality: N/A;   ENTEROSCOPY N/A 11/20/2023   Procedure: ENTEROSCOPY;  Surgeon: Dianna Specking, MD;  Location: [facility] ENDOSCOPY;  Service: Gastroenterology;  Laterality: N/A;   ESOPHAGOGASTRODUODENOSCOPY N/A 10/26/2023   Procedure: EGD (ESOPHAGOGASTRODUODENOSCOPY);  Surgeon: Elicia Claw, MD;  Location: [facility] ENDOSCOPY;  Service: Gastroenterology;  Laterality: N/A;   ESOPHAGOGASTRODUODENOSCOPY N/A 11/20/2023   Procedure: EGD (ESOPHAGOGASTRODUODENOSCOPY);  Surgeon: Dianna Specking, MD;  Location: [facility] ENDOSCOPY;  Service: Gastroenterology;  Laterality: N/A;   ESOPHAGOGASTRODUODENOSCOPY (EGD) WITH PROPOFOL  N/A 05/08/2021   Procedure: ESOPHAGOGASTRODUODENOSCOPY (EGD) WITH PROPOFOL ;  Surgeon: Saintclair Jasper, MD;  Location: [facility] ENDOSCOPY;  Service: Gastroenterology;  Laterality: N/A;   ESOPHAGOGASTRODUODENOSCOPY (EGD) WITH PROPOFOL  N/A 01/11/2022   Procedure: ESOPHAGOGASTRODUODENOSCOPY (EGD) WITH PROPOFOL ;  Surgeon: Rosalie Kitchens,  MD;  Location: [facility] ENDOSCOPY;  Service: Gastroenterology;  Laterality: N/A;   ESOPHAGOGASTRODUODENOSCOPY (EGD) WITH PROPOFOL  N/A 01/06/2023   Procedure: ESOPHAGOGASTRODUODENOSCOPY (EGD) WITH PROPOFOL ;  Surgeon: Rosalie Kitchens, MD;  Location: [facility] ENDOSCOPY;  Service: Gastroenterology;  Laterality: N/A;   ESOPHAGOGASTRODUODENOSCOPY (EGD) WITH PROPOFOL  N/A 04/21/2023   Procedure: ESOPHAGOGASTRODUODENOSCOPY (EGD) WITH PROPOFOL ;  Surgeon: Dianna Specking, MD;  Location: [facility] ENDOSCOPY;  Service: Gastroenterology;  Laterality: N/A;   EYE SURGERY Bilateral    to improve vision after cataract OR   GIVENS CAPSULE STUDY N/A 04/21/2023   Procedure: GIVENS CAPSULE STUDY;  Surgeon: Dianna Specking, MD;  Location: [facility] ENDOSCOPY;  Service: Gastroenterology;  Laterality: N/A;   GIVENS CAPSULE STUDY N/A 11/18/2023   Procedure: IMAGING PROCEDURE, GI TRACT, INTRALUMINAL, VIA CAPSULE;  Surgeon: Dianna Specking, MD;  Location: [facility] ENDOSCOPY;  Service: Gastroenterology;  Laterality: N/A;   HEMOSTASIS CLIP PLACEMENT  10/26/2023   Procedure: CONTROL OF HEMORRHAGE, GI TRACT, ENDOSCOPIC, BY CLIPPING OR OVERSEWING;  Surgeon: Elicia Claw, MD;  Location: [facility] ENDOSCOPY;  Service: Gastroenterology;;   IR ANGIOGRAM FOLLOW UP STUDY  12/19/2020   IR ANGIOGRAM SELECTIVE EACH ADDITIONAL VESSEL  12/19/2020   IR ANGIOGRAM SELECTIVE EACH ADDITIONAL VESSEL  12/19/2020   IR ANGIOGRAM VISCERAL SELECTIVE  12/19/2020   IR EMBO ART  VEN HEMORR LYMPH EXTRAV  INC GUIDE ROADMAPPING  12/19/2020   IR US  GUIDE VASC ACCESS RIGHT  12/19/2020   JOINT REPLACEMENT     POLYPECTOMY  05/04/2022   Procedure: POLYPECTOMY;  Surgeon: Saintclair Jasper, MD;  Location: [facility] ENDOSCOPY;  Service: Gastroenterology;;   POLYPECTOMY  10/26/2023   Procedure: POLYPECTOMY, INTESTINE;  Surgeon: Elicia Claw, MD;  Location: [facility] ENDOSCOPY;  Service: Gastroenterology;;   STAPEDES SURGERY Left    scraped stapedes because it was sticking when it wasn't suppose to   TONSILLECTOMY  AND ADENOIDECTOMY  1946   TOTAL KNEE ARTHROPLASTY Left ~ 2008   TOTAL KNEE ARTHROPLASTY  12/20/2011   Procedure: TOTAL KNEE ARTHROPLASTY;  Surgeon: Elspeth JONELLE Her, MD;  Location: [facility] OR;  Service: Orthopedics;  Laterality: Right;  Right Total Knee Arthroplasty   TUBAL LIGATION  1980's    Social History:  reports that she quit smoking about 31 years ago. Her smoking use included cigarettes. She started smoking about 68 years ago. She has a 27.8 pack-year smoking history. She has never been exposed to tobacco smoke. She has never used smokeless tobacco. She reports current alcohol  use of about 7.0 standard drinks of alcohol  per week. She reports that she does not use drugs.   Allergies  Allergen Reactions   Morphine And Codeine Itching   Aspirin Other (See Comments)    Unknown reaction   Duloxetine  Hcl Other (See Comments)    Unknown reaction   Tymlos [Abaloparatide] Other (See Comments)    Unknown reaction   Ventolin  [Albuterol ] Other (See Comments)    rapid heart beat   Cefadroxil Hives    Patient can take amoxicillin  and cipro     Family History  Problem Relation Age of Onset   Heart attack Mother    Heart disease Father    Heart failure Father    Bladder Cancer Father    Supraventricular tachycardia Sister  Hypertension Sister     Family history reviewed and not pertinent ***   Prior to Admission medications   Medication Sig Start Date End Date Taking? Authorizing Provider  acetaminophen  (TYLENOL ) 650 MG CR tablet Take 1,300 mg by mouth in the morning and at bedtime.   Yes [provider]  albuterol  (VENTOLIN  HFA) 108 (90 Base) MCG/ACT inhaler Inhale 2 puffs into the lungs every 4 (four) hours as needed for wheezing or shortness of breath.   Yes [provider]  alendronate (FOSAMAX) 70 MG tablet Take 70 mg by mouth every Sunday. Take with a full glass of water on an empty stomach.   Yes [provider]  Ascorbic Acid  (VITAMIN C ) 1000 MG tablet  Take 1,000 mg by mouth every morning.   Yes [provider]  cetirizine  (ZYRTEC ) 5 MG chewable tablet Chew 5 mg by mouth daily as needed for allergies.   Yes [provider]  Cholecalciferol  (VITAMIN D3) 50 MCG (2000 UT) TABS Take 2,000 Units by mouth every morning.   Yes [provider]  cyanocobalamin  (VITAMIN B12) 1000 MCG tablet Take 1,000 mcg by mouth daily.   Yes [provider]  Dorzolamide  HCl-Timolol  Mal PF 2-0.5 % SOLN Place 1 drop into both eyes in the morning and at bedtime.   Yes [provider]  ferrous gluconate  (FERGON) 240 (27 FE) MG tablet Take 240 mg by mouth every morning. Take without food   Yes [provider]  latanoprost  (XALATAN ) 0.005 % ophthalmic solution Place 1 drop into both eyes at bedtime.   Yes [provider]  Multiple Vitamins-Minerals (PRESERVISION AREDS 2) CAPS Take 1 capsule by mouth 2 (two) times daily.   Yes [provider]  Netarsudil  Dimesylate (RHOPRESSA ) 0.02 % SOLN Place 1 drop into both eyes at bedtime.   Yes [provider]  oxybutynin  (DITROPAN -XL) 10 MG 24 hr tablet Take 10 mg by mouth at bedtime.   Yes [provider]  pantoprazole  (PROTONIX ) 40 MG tablet Take 1 tablet (40 mg total) by mouth 2 (two) times daily before a meal. 11/24/23  Yes Amin, Ankit C, MD  potassium chloride  20 MEQ/15ML (10%) SOLN Take 15 mLs by mouth daily.   Yes [provider]  Probiotic Product (ALIGN) 4 MG CAPS Take 4 mg by mouth at bedtime.   Yes [provider]  rOPINIRole  (REQUIP ) 0.5 MG tablet Take 1 tablet (0.5 mg total) by mouth daily as needed. 04/30/23  Yes [name], [name], NP  senna (SENOKOT) 8.6 MG TABS tablet Take 2 tablets by mouth at bedtime.   Yes [provider]  sertraline  (ZOLOFT ) 50 MG tablet Take 1.5 tablets (75 mg total) by mouth daily. 01/07/23  Yes Samtani, Jai-Gurmukh, MD  simvastatin  (ZOCOR ) 20 MG tablet Take 20 mg by mouth every evening.   Yes  [provider]  Sodium Fluoride  (PREVIDENT 5000 BOOSTER PLUS) 1.1 % PSTE Place 1 Application onto teeth See admin instructions. Brush on teeth with a toothbrush after evening mouth care. Spit out excess and do not rinse.   Yes [provider]  torsemide  (DEMADEX ) 20 MG tablet Take 20 mg by mouth daily.   Yes [provider]  traMADol  HCl 25 MG TABS Take 25 mg by mouth 2 (two) times daily. 12/22/23  Yes [name], [name], NP  umeclidinium-vilanterol (ANORO ELLIPTA ) 62.5-25 MCG/ACT AEPB Inhale 1 puff into the lungs every morning.   Yes [provider]  docusate sodium  (COLACE) 100 MG capsule  Take 200 mg by mouth at bedtime. Patient not taking: No sig reported    [provider]  potassium chloride  SA (KLOR-CON  M) 20 MEQ tablet Take 1 tablet (20 mEq total) by mouth daily. Patient not taking: Reported on 12/27/2023 11/26/23   Judeth Trenda BIRCH, MD     Objective    Physical Exam: Vitals:   12/27/23 2030 12/27/23 2130 12/27/23 2200 12/27/23 2302  BP: 97/63 (!) 123/112 93/76 115/61  Pulse: (!) 133 (!) 132 (!) 123 (!) 111  Resp: (!) 23 20 (!) 27 (!) 26  Temp:    98 F (36.7 C)  TempSrc:      SpO2: 95% 95% 91% 92%  Weight:      Height:        General: appears to be stated age; alert, oriented Skin: warm, dry, no rash Head:  AT/[place] Mouth:  Oral mucosa membranes appear moist, normal dentition Neck: supple; trachea midline Heart:  RRR; did not appreciate any M/R/G Lungs: CTAB, did not appreciate any wheezes, rales, or rhonchi Abdomen: + BS; soft, ND, NT Vascular: 2+ pedal pulses b/l; 2+ radial pulses b/l Extremities: no peripheral edema, no muscle wasting Neuro: strength and sensation intact in upper and lower extremities b/l    *** Neuro: 5/5 strength of the proximal and distal flexors and extensors of the upper and lower extremities bilaterally; sensation intact in upper and lower extremities b/l; cranial nerves II through XII grossly intact; no  pronator drift; no evidence suggestive of slurred speech, dysarthria, or facial droop; Normal muscle tone. No tremors. *** Neuro: In the setting of the patient's current mental status and associated inability to follow instructions, unable to perform full neurologic exam at this time.  As such, assessment of strength, sensation, and cranial nerves is limited at this time. Patient noted to spontaneously move all 4 extremities. No tremors.  ***    Labs on Admission: I have personally reviewed following labs and imaging studies  CBC: Recent Labs  Lab 12/27/23 1130 12/27/23 1139 12/27/23 1753 12/27/23 2317  WBC 5.8  --   --   --   NEUTROABS 4.0  --   --   --   HGB 9.4* 10.2* 8.3* 6.4*  HCT 31.2* 30.0* 27.2* 21.2*  MCV 99.7  --   --   --   PLT 170  --   --   --    Basic Metabolic Panel: Recent Labs  Lab 12/27/23 1130 12/27/23 1139  NA 140 142  K 3.4* 3.1*  CL 103 101  CO2 28  --   GLUCOSE 120* 120*  BUN 11 9  CREATININE 0.52 0.60  CALCIUM  8.4*  --    GFR: Estimated Creatinine Clearance: 47.6 mL/min (by C-G formula based on SCr of 0.6 mg/dL). Liver Function Tests: Recent Labs  Lab 12/27/23 1130  AST 23  ALT 8  ALKPHOS 65  BILITOT 0.8  PROT 6.1*  ALBUMIN 3.5   No results for input(s): LIPASE, AMYLASE in the last 168 hours. No results for input(s): AMMONIA in the last 168 hours. Coagulation Profile: Recent Labs  Lab 12/27/23 1753  INR 1.2   Cardiac Enzymes: No results for input(s): CKTOTAL, CKMB, CKMBINDEX, TROPONINI in the last 168 hours. BNP (last 3 results) No results for input(s): PROBNP in the last 8760 hours. HbA1C: No results for input(s): HGBA1C in the last 72 hours. CBG: No results for input(s): GLUCAP in the last 168 hours. Lipid Profile: No results for input(s): CHOL, HDL, LDLCALC,  TRIG, CHOLHDL, LDLDIRECT in the last 72 hours. Thyroid  Function Tests: No results for input(s): TSH, T4TOTAL, FREET4, T3FREE,  THYROIDAB in the last 72 hours. Anemia Panel: No results for input(s): VITAMINB12, FOLATE, FERRITIN, TIBC, IRON , RETICCTPCT in the last 72 hours. Urine analysis:    Component Value Date/Time   COLORURINE YELLOW 01/08/2022 0500   APPEARANCEUR HAZY (A) 01/08/2022 0500   LABSPEC 1.013 01/08/2022 0500   PHURINE 5.0 01/08/2022 0500   GLUCOSEU NEGATIVE 01/08/2022 0500   HGBUR NEGATIVE 01/08/2022 0500   BILIRUBINUR NEGATIVE 01/08/2022 0500   KETONESUR NEGATIVE 01/08/2022 0500   PROTEINUR NEGATIVE 01/08/2022 0500   UROBILINOGEN 0.2 10/20/2007 2015   NITRITE POSITIVE (A) 01/08/2022 0500   LEUKOCYTESUR MODERATE (A) 01/08/2022 0500    Radiological Exams on Admission: CT ANGIO GI BLEED Result Date: 12/27/2023 CLINICAL DATA:  Rectal bleeding.  Left upper abdominal pain. EXAM: CTA ABDOMEN AND PELVIS WITHOUT AND WITH CONTRAST TECHNIQUE: Multidetector CT imaging of the abdomen and pelvis was performed using the standard protocol during bolus administration of intravenous contrast. Multiplanar reconstructed images and MIPs were obtained and reviewed to evaluate the vascular anatomy. RADIATION DOSE REDUCTION: This exam was performed according to the departmental dose-optimization program which includes automated exposure control, adjustment of the mA and/or kV according to patient size and/or use of iterative reconstruction technique. CONTRAST:  OMNIPAQUE  IOHEXOL  350 MG/ML SOLN COMPARISON:  None Available. FINDINGS: VASCULAR Aorta: Atherosclerotic disease in the abdominal aorta without aneurysm or dissection or significant stenosis. Celiac: Patent without evidence of aneurysm, dissection, vasculitis or significant stenosis. SMA: Patent without evidence of aneurysm, dissection, vasculitis or significant stenosis. Renals: Single bilateral renal arteries. Left renal artery is widely patent without aneurysm, dissection or stenosis. At least mild stenosis at origin of the right renal artery  without aneurysm or dissection. IMA: Patent without evidence of aneurysm, dissection, vasculitis or significant stenosis. 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 Inflow: Patent without evidence of aneurysm, dissection, vasculitis or significant stenosis. Proximal Outflow: Proximal femoral arteries are patent bilaterally. Veins: Portal venous system is patent. Normal appearance of the IVC and renal veins. Normal appearance of the iliac veins. Review of the MIP images confirms the above findings. NON-VASCULAR Lower chest: Peripheral reticular densities at the lung bases are suggestive for chronic changes. No pleural effusions. Hepatobiliary: Gallbladder is mildly distended with high-density sludge or small stones. No gallbladder inflammation. Main portal venous system is patent. No biliary dilatation. Pancreas: Unremarkable. No pancreatic ductal dilatation or surrounding inflammatory changes. Spleen: Normal in size without focal abnormality. Adrenals/Urinary Tract: Normal adrenal glands. No kidney stones. Cyst in the left kidney upper pole. No hydronephrosis. No suspicious renal lesion. Normal urinary bladder. Stomach/Bowel: Positive for active GI bleeding involving the descending colon. 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 The area of arterial bleeding in the colon is distal to the previously embolized area. Extensive diverticular disease throughout the colon. Endoscopic clip in the right colon near the ileocecal valve. Normal appendix. Normal stomach. No bowel dilatation or obstruction. Lymphatic: No lymph node enlargement in the abdomen or pelvis. Reproductive: Uterus and bilateral adnexa are unremarkable. Other: Negative for ascites.  Negative for free air. Musculoskeletal: No acute bone abnormality. Old vertebral body compression fracture at L1. IMPRESSION: 1. Positive for active GI  bleeding involving the descending colon. Findings are most compatible with a diverticular bleed. 2. Endovascular coils involving left colic artery/marginal artery near the splenic  flexure. 3. Extensive diverticular disease throughout the colon. 4. Gallbladder sludge or small stones. No gallbladder inflammation. 5. Aortic Atherosclerosis (ICD10-I70.0). These results were called by telephone at the time of interpretation on 12/27/2023 at 5:12 pm to provider Dr. Efrain, who verbally acknowledged these results. Electronically Signed   By: Juliene Balder M.D.   On: 12/27/2023 20:33      Assessment/Plan    Principal Problem:   Acute lower GI bleeding Active Problems:   Hyperlipidemia   COPD (chronic obstructive pulmonary disease) (HCC)   Restless leg syndrome   Hypertension   GERD (gastroesophageal reflux disease)   GAD (generalized anxiety disorder)   Chronic heart failure with preserved ejection fraction (HFpEF) (HCC)  ***        ***                  ***                  ***                  ***                  ***                 ***                  ***                  ***                  ***                  ***                  ***                  ***                 ***     DVT prophylaxis: SCD's ***  Code Status: Full code*** Family Communication: none*** Disposition Plan: Per Rounding Team Consults called: none***;  Admission status: ***    I SPENT GREATER THAN 75 *** MINUTES IN CLINICAL CARE TIME/MEDICAL DECISION-MAKING IN COMPLETING THIS ADMISSION.     Eva NOVAK Leaner Morici DO [facility] From 7PM - 7AM   12/27/2023, 11:47 PM   ***

## 2023-12-27 NOTE — ED Triage Notes (Signed)
 Patient BIB [facility] from [facility]. Has had bright red rectal bleeding since last night that worsens when she stands. History of GI bleeds, had a hole in her small intestine that was cauterized. Denies blood thinners. Denies abdominal pain.

## 2023-12-27 NOTE — Progress Notes (Signed)
 Progress Note  Spoke to Dr. Hughes, IR, about 45 min ago for possible embolization.  He felt patient did not need urgent embolization at this time and suggested to give patient blood +/- volume at this time.  If she does have another bloody BM, then to give him a call. Orlin Fairly, MD 12/27/2023

## 2023-12-27 NOTE — Consult Note (Signed)
 [facility] Gastroenterology Consult  Referring Provider: No ref. provider found Primary Care Physician:  Gil Greig BRAVO, NP Primary Gastroenterologist: [name]  Reason for Consultation: Hematochezia  SUBJECTIVE:   HPI: [name] is a 83 y.o. female known to the GI service presents for painless hematochezia.  Patient noted that symptoms began the same morning and continued prompting her presentation to the emergency department.  No abdominal pain.  No chest pain or shortness of breath.  No nausea or vomiting.  No anticoagulant use.  Video capsule endoscopy completed 11/18/2023 showed nodular mucosa in duodenal bulb, on my evaluation of images, does appear to have blood at first cecal image and at 3 hours 44 minutes.  EGD completed 11/20/2023 (Dr. Dianna) showed patchy inflammation with erosions and erythema in the gastric antrum, multiple polyps with no bleeding in the gastric fundus and body.  Small bowel enteroscopy completed 11/20/2023 (Dr. Dianna) showed no evidence of significant pathology in the duodenum nor jejunum.  Consideration was given for double-balloon enteroscopy at a tertiary care center if anemia continues to occur.  Colonoscopy 10/26/2023 for hematochezia and therapeutic procedure for colon polyps (Dr. Elicia) showed hemorrhoids, normal-appearing terminal ileum, segmental moderate inflammation in proximal ascending colon and cecum, 20 mm polyp in ascending colon removed piecemeal via hot snare status post Hemoclip placement x 1, 8 mm hepatic flexure polyp status post hot snare polypectomy, pancolonic diverticulosis, internal hemorrhoids.  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  Hepatic flexure polyp showed sessile serrated adenoma without cytologic dysplasia.  Cecum biopsy showed acute colitis (comments indicating a resolving acute self-limited colitis).  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, left side   Sleeping excessive    Tinnitus of both ears    Past Surgical History:  Procedure Laterality Date   BALLOON DILATION N/A 05/08/2021   Procedure: BALLOON DILATION;  Surgeon: Saintclair Jasper, MD;  Location: [facility] ENDOSCOPY;  Service: Gastroenterology;  Laterality: N/A;   BASAL CELL CARCINOMA EXCISION  2001   off my nose, left side   BIOPSY  05/08/2021   Procedure: BIOPSY;  Surgeon: Saintclair Jasper, MD;  Location: [facility] ENDOSCOPY;  Service: Gastroenterology;;   BIOPSY  04/25/2023   Procedure: BIOPSY;  Surgeon: Dianna Specking, MD;  Location: [facility] ENDOSCOPY;  Service: Gastroenterology;;   BLEPHAROPLASTY Bilateral    BONE BIOPSY  10/26/2023   Procedure: BIOPSY, GI;  Surgeon: Elicia Claw, MD;  Location: [facility] ENDOSCOPY;  Service: Gastroenterology;;   CATARACT EXTRACTION W/ INTRAOCULAR LENS  IMPLANT, BILATERAL Bilateral 1990's   COLONOSCOPY N/A 10/26/2023   Procedure: COLONOSCOPY;  Surgeon: Elicia Claw, MD;  Location: [facility] ENDOSCOPY;  Service: Gastroenterology;  Laterality: N/A;   COLONOSCOPY WITH PROPOFOL  N/A 05/04/2022   Procedure: COLONOSCOPY WITH PROPOFOL ;  Surgeon: Saintclair Jasper, MD;  Location: [facility] ENDOSCOPY;  Service: Gastroenterology;  Laterality: N/A;   COLONOSCOPY WITH PROPOFOL   N/A 04/25/2023   Procedure: COLONOSCOPY WITH PROPOFOL ;  Surgeon: Dianna Specking, MD;  Location: [facility] ENDOSCOPY;  Service: Gastroenterology;  Laterality: N/A;   ENTEROSCOPY N/A 11/20/2023   Procedure:  ENTEROSCOPY;  Surgeon: Dianna Specking, MD;  Location: [facility] ENDOSCOPY;  Service: Gastroenterology;  Laterality: N/A;   ESOPHAGOGASTRODUODENOSCOPY N/A 10/26/2023   Procedure: EGD (ESOPHAGOGASTRODUODENOSCOPY);  Surgeon: Elicia Claw, MD;  Location: [facility] ENDOSCOPY;  Service: Gastroenterology;  Laterality: N/A;   ESOPHAGOGASTRODUODENOSCOPY N/A 11/20/2023   Procedure: EGD (ESOPHAGOGASTRODUODENOSCOPY);  Surgeon: Dianna Specking, MD;  Location: [facility] ENDOSCOPY;  Service: Gastroenterology;  Laterality: N/A;   ESOPHAGOGASTRODUODENOSCOPY (EGD) WITH PROPOFOL  N/A 05/08/2021   Procedure: ESOPHAGOGASTRODUODENOSCOPY (EGD) WITH PROPOFOL ;  Surgeon: Saintclair Jasper, MD;  Location: [facility] ENDOSCOPY;  Service: Gastroenterology;  Laterality: N/A;   ESOPHAGOGASTRODUODENOSCOPY (EGD) WITH PROPOFOL  N/A 01/11/2022   Procedure: ESOPHAGOGASTRODUODENOSCOPY (EGD) WITH PROPOFOL ;  Surgeon: Rosalie Kitchens, MD;  Location: [facility] ENDOSCOPY;  Service: Gastroenterology;  Laterality: N/A;   ESOPHAGOGASTRODUODENOSCOPY (EGD) WITH PROPOFOL  N/A 01/06/2023   Procedure: ESOPHAGOGASTRODUODENOSCOPY (EGD) WITH PROPOFOL ;  Surgeon: Rosalie Kitchens, MD;  Location: [facility] ENDOSCOPY;  Service: Gastroenterology;  Laterality: N/A;   ESOPHAGOGASTRODUODENOSCOPY (EGD) WITH PROPOFOL  N/A 04/21/2023   Procedure: ESOPHAGOGASTRODUODENOSCOPY (EGD) WITH PROPOFOL ;  Surgeon: Dianna Specking, MD;  Location: [facility] ENDOSCOPY;  Service: Gastroenterology;  Laterality: N/A;   EYE SURGERY Bilateral    to improve vision after cataract OR   GIVENS CAPSULE STUDY N/A 04/21/2023   Procedure: GIVENS CAPSULE STUDY;  Surgeon: Dianna Specking, MD;  Location: [facility] ENDOSCOPY;  Service: Gastroenterology;  Laterality: N/A;   GIVENS CAPSULE STUDY N/A 11/18/2023   Procedure: IMAGING PROCEDURE, GI TRACT, INTRALUMINAL, VIA CAPSULE;  Surgeon: Dianna Specking, MD;  Location: [facility] ENDOSCOPY;  Service: Gastroenterology;  Laterality: N/A;   HEMOSTASIS CLIP PLACEMENT  10/26/2023   Procedure: CONTROL OF HEMORRHAGE, GI  TRACT, ENDOSCOPIC, BY CLIPPING OR OVERSEWING;  Surgeon: Elicia Claw, MD;  Location: [facility] ENDOSCOPY;  Service: Gastroenterology;;   IR ANGIOGRAM FOLLOW UP STUDY  12/19/2020   IR ANGIOGRAM SELECTIVE EACH ADDITIONAL VESSEL  12/19/2020   IR ANGIOGRAM SELECTIVE EACH ADDITIONAL VESSEL  12/19/2020   IR ANGIOGRAM VISCERAL SELECTIVE  12/19/2020   IR EMBO ART  VEN HEMORR LYMPH EXTRAV  INC GUIDE ROADMAPPING  12/19/2020   IR US  GUIDE VASC ACCESS RIGHT  12/19/2020   JOINT REPLACEMENT     POLYPECTOMY  05/04/2022   Procedure: POLYPECTOMY;  Surgeon: Saintclair Jasper, MD;  Location: [facility] ENDOSCOPY;  Service: Gastroenterology;;   POLYPECTOMY  10/26/2023   Procedure: POLYPECTOMY, INTESTINE;  Surgeon: Elicia Claw, MD;  Location: [facility] ENDOSCOPY;  Service: Gastroenterology;;   STAPEDES SURGERY Left    scraped stapedes because it was sticking when it wasn't suppose to   TONSILLECTOMY AND ADENOIDECTOMY  1946   TOTAL KNEE ARTHROPLASTY Left ~ 2008   TOTAL KNEE ARTHROPLASTY  12/20/2011   Procedure: TOTAL KNEE ARTHROPLASTY;  Surgeon: Elspeth JONELLE Her, MD;  Location: [facility] OR;  Service: Orthopedics;  Laterality: Right;  Right Total Knee Arthroplasty   TUBAL LIGATION  1980's   Prior to Admission medications   Medication Sig Start Date End Date Taking? Authorizing Provider  acetaminophen  (TYLENOL ) 650 MG CR tablet Take 1,300 mg by mouth in the morning and at bedtime.   Yes [provider]  albuterol  (VENTOLIN  HFA) 108 (90 Base) MCG/ACT inhaler Inhale 2 puffs into the lungs every 4 (four) hours as needed for wheezing or shortness of breath.   Yes [provider]  alendronate (FOSAMAX) 70 MG tablet Take 70 mg by mouth every Sunday. Take with  a full glass of water on an empty stomach.   Yes [provider]  Ascorbic Acid  (VITAMIN C ) 1000 MG tablet Take 1,000 mg by mouth every morning.   Yes [provider]  cetirizine  (ZYRTEC ) 5 MG chewable tablet Chew 5 mg by mouth daily as needed for allergies.    Yes [provider]  Cholecalciferol  (VITAMIN D3) 50 MCG (2000 UT) TABS Take 2,000 Units by mouth every morning.   Yes [provider]  cyanocobalamin  (VITAMIN B12) 1000 MCG tablet Take 1,000 mcg by mouth daily.   Yes [provider]  Dorzolamide  HCl-Timolol  Mal PF 2-0.5 % SOLN Place 1 drop into both eyes in the morning and at bedtime.   Yes [provider]  ferrous gluconate  (FERGON) 240 (27 FE) MG tablet Take 240 mg by mouth every morning. Take without food   Yes [provider]  latanoprost  (XALATAN ) 0.005 % ophthalmic solution Place 1 drop into both eyes at bedtime.   Yes [provider]  Multiple Vitamins-Minerals (PRESERVISION AREDS 2) CAPS Take 1 capsule by mouth 2 (two) times daily.   Yes [provider]  Netarsudil  Dimesylate (RHOPRESSA ) 0.02 % SOLN Place 1 drop into both eyes at bedtime.   Yes [provider]  oxybutynin  (DITROPAN -XL) 10 MG 24 hr tablet Take 10 mg by mouth at bedtime.   Yes [provider]  pantoprazole  (PROTONIX ) 40 MG tablet Take 1 tablet (40 mg total) by mouth 2 (two) times daily before a meal. 11/24/23  Yes Amin, Ankit C, MD  potassium chloride  20 MEQ/15ML (10%) SOLN Take 15 mLs by mouth daily.   Yes [provider]  Probiotic Product (ALIGN) 4 MG CAPS Take 4 mg by mouth at bedtime.   Yes [provider]  rOPINIRole  (REQUIP ) 0.5 MG tablet Take 1 tablet (0.5 mg total) by mouth daily as needed. 04/30/23  Yes [name], [name], NP  senna (SENOKOT) 8.6 MG TABS tablet Take 2 tablets by mouth at bedtime.   Yes [provider]  sertraline  (ZOLOFT ) 50 MG tablet Take 1.5 tablets (75 mg total) by mouth daily. 01/07/23  Yes Samtani, Jai-Gurmukh, MD  simvastatin  (ZOCOR ) 20 MG tablet Take 20 mg by mouth every evening.   Yes [provider]  Sodium Fluoride  (PREVIDENT 5000 BOOSTER PLUS) 1.1 % PSTE Place 1 Application onto teeth See admin instructions. Brush on teeth with a  toothbrush after evening mouth care. Spit out excess and do not rinse.   Yes [provider]  torsemide  (DEMADEX ) 20 MG tablet Take 20 mg by mouth daily.   Yes [provider]  traMADol  HCl 25 MG TABS Take 25 mg by mouth 2 (two) times daily. 12/22/23  Yes [name], [name], NP  umeclidinium-vilanterol (ANORO ELLIPTA ) 62.5-25 MCG/ACT AEPB Inhale 1 puff into the lungs every morning.   Yes [provider]  docusate sodium  (COLACE) 100 MG capsule Take 200 mg by mouth at bedtime. Patient not taking: No sig reported    [provider]  potassium chloride  SA (KLOR-CON  M) 20 MEQ tablet Take 1 tablet (20 mEq total) by mouth daily. Patient not taking: Reported on 12/27/2023 11/26/23   Judeth Trenda BIRCH, MD   No current facility-administered medications for this encounter.   Current Outpatient Medications  Medication Sig Dispense Refill   acetaminophen  (TYLENOL ) 650 MG CR tablet Take 1,300 mg by mouth in the morning and at bedtime.     albuterol  (VENTOLIN  HFA) 108 (90 Base) MCG/ACT inhaler Inhale 2  puffs into the lungs every 4 (four) hours as needed for wheezing or shortness of breath.     alendronate (FOSAMAX) 70 MG tablet Take 70 mg by mouth every Sunday. Take with a full glass of water on an empty stomach.     Ascorbic Acid  (VITAMIN C ) 1000 MG tablet Take 1,000 mg by mouth every morning.     cetirizine  (ZYRTEC ) 5 MG chewable tablet Chew 5 mg by mouth daily as needed for allergies.     Cholecalciferol  (VITAMIN D3) 50 MCG (2000 UT) TABS Take 2,000 Units by mouth every morning.     cyanocobalamin  (VITAMIN B12) 1000 MCG tablet Take 1,000 mcg by mouth daily.     Dorzolamide  HCl-Timolol  Mal PF 2-0.5 % SOLN Place 1 drop into both eyes in the morning and at bedtime.     ferrous gluconate  (FERGON) 240 (27 FE) MG tablet Take 240 mg by mouth every morning. Take without food     latanoprost  (XALATAN ) 0.005 % ophthalmic solution Place 1 drop into both eyes at bedtime.     Multiple  Vitamins-Minerals (PRESERVISION AREDS 2) CAPS Take 1 capsule by mouth 2 (two) times daily.     Netarsudil  Dimesylate (RHOPRESSA ) 0.02 % SOLN Place 1 drop into both eyes at bedtime.     oxybutynin  (DITROPAN -XL) 10 MG 24 hr tablet Take 10 mg by mouth at bedtime.     pantoprazole  (PROTONIX ) 40 MG tablet Take 1 tablet (40 mg total) by mouth 2 (two) times daily before a meal.     potassium chloride  20 MEQ/15ML (10%) SOLN Take 15 mLs by mouth daily.     Probiotic Product (ALIGN) 4 MG CAPS Take 4 mg by mouth at bedtime.     rOPINIRole  (REQUIP ) 0.5 MG tablet Take 1 tablet (0.5 mg total) by mouth daily as needed. 30 tablet 0   senna (SENOKOT) 8.6 MG TABS tablet Take 2 tablets by mouth at bedtime.     sertraline  (ZOLOFT ) 50 MG tablet Take 1.5 tablets (75 mg total) by mouth daily. 7 tablet 0   simvastatin  (ZOCOR ) 20 MG tablet Take 20 mg by mouth every evening.     Sodium Fluoride  (PREVIDENT 5000 BOOSTER PLUS) 1.1 % PSTE Place 1 Application onto teeth See admin instructions. Brush on teeth with a toothbrush after evening mouth care. Spit out excess and do not rinse.     torsemide  (DEMADEX ) 20 MG tablet Take 20 mg by mouth daily.     traMADol  HCl 25 MG TABS Take 25 mg by mouth 2 (two) times daily. 60 tablet 0   umeclidinium-vilanterol (ANORO ELLIPTA ) 62.5-25 MCG/ACT AEPB Inhale 1 puff into the lungs every morning.     docusate sodium  (COLACE) 100 MG capsule Take 200 mg by mouth at bedtime. (Patient not taking: No sig reported)     potassium chloride  SA (KLOR-CON  M) 20 MEQ tablet Take 1 tablet (20 mEq total) by mouth daily. (Patient not taking: Reported on 12/27/2023)     Allergies as of 12/27/2023 - Review Complete 12/27/2023  Allergen Reaction Noted   Morphine and codeine Itching 11/29/2011   Aspirin Other (See Comments) 10/22/2020   Duloxetine  hcl Other (See Comments) 10/22/2020   Tymlos [abaloparatide] Other (See Comments) 10/22/2020   Ventolin  [albuterol ] Other (See Comments) 12/25/2020   Cefadroxil  Hives 12/11/2011   Family History  Problem Relation Age of Onset   Heart attack Mother    Heart disease Father    Heart failure Father    Bladder Cancer Father  Supraventricular tachycardia Sister    Hypertension Sister    Social History   Socioeconomic History   Marital status: Married    Spouse name: Signe    Number of children: 3   Years of education: College   Highest education level: Not on file  Occupational History   Occupation: Retired Runner, broadcasting/film/video  Tobacco Use   Smoking status: Former    Current packs/day: 0.00    Average packs/day: 0.8 packs/day for 37.0 years (27.8 ttl pk-yrs)    Types: Cigarettes    Start date: 07/02/1955    Quit date: 07/01/1992    Years since quitting: 31.5    Passive exposure: Never   Smokeless tobacco: Never  Vaping Use   Vaping status: Never Used  Substance and Sexual Activity   Alcohol  use: Yes    Alcohol /week: 7.0 standard drinks of alcohol     Types: 7 Glasses of wine per week    Comment: 09/05/2017  6 oz wine, 5 days/wk   Drug use: No    Types: Hydrocodone    Sexual activity: Not Currently  Other Topics Concern   Not on file  Social History Narrative   1-2 cups of coffee a day    Social Drivers of Health   Financial Resource Strain: Low Risk  (04/04/2023)   Overall Financial Resource Strain (CARDIA)    Difficulty of Paying Living Expenses: Not hard at all  Food Insecurity: No Food Insecurity (11/18/2023)   Hunger Vital Sign    Worried About Running Out of Food in the Last Year: Never true    Ran Out of Food in the Last Year: Never true  Transportation Needs: No Transportation Needs (11/18/2023)   PRAPARE - Administrator, Civil Service (Medical): No    Lack of Transportation (Non-Medical): No  Physical Activity: Insufficiently Active (04/04/2023)   Exercise Vital Sign    Days of Exercise per Week: 2 days    Minutes of Exercise per Session: 20 min  Stress: No Stress Concern Present (04/04/2023)   [employer] of  Occupational Health - Occupational Stress Questionnaire    Feeling of Stress : Only a little  Social Connections: Socially Integrated (11/18/2023)   Social Connection and Isolation Panel    Frequency of Communication with Friends and Family: More than three times a week    Frequency of Social Gatherings with Friends and Family: More than three times a week    Attends Religious Services: 1 to 4 times per year    Active Member of [employer] or Organizations: Yes    Attends Banker Meetings: 1 to 4 times per year    Marital Status: Married  Catering manager Violence: Not At Risk (11/18/2023)   Humiliation, Afraid, Rape, and Kick questionnaire    Fear of Current or Ex-Partner: No    Emotionally Abused: No    Physically Abused: No    Sexually Abused: No   Review of Systems:  Review of Systems  Respiratory:  Negative for shortness of breath.   Cardiovascular:  Negative for chest pain.  Gastrointestinal:  Positive for blood in stool. Negative for abdominal pain, nausea and vomiting.    OBJECTIVE:   Temp:  [97.6 F (36.4 C)-98.3 F (36.8 C)] 98.3 F (36.8 C) (06/28 1053) Pulse Rate:  [75-95] 75 (06/28 1100) Resp:  [18-20] 18 (06/28 1100) BP: (110-126)/(55-57) 110/55 (06/28 1100) SpO2:  [92 %-100 %] 92 % (06/28 1100) Weight:  [75 kg] 75 kg (06/28 0947)   Physical Exam Constitutional:  General: She is not in acute distress.    Appearance: She is not toxic-appearing.   Cardiovascular:     Rate and Rhythm: Normal rate and regular rhythm.  Pulmonary:     Effort: No respiratory distress.     Breath sounds: Normal breath sounds.  Abdominal:     General: There is no distension.     Palpations: Abdomen is soft.     Tenderness: There is no abdominal tenderness. There is no guarding.   Musculoskeletal:     Right lower leg: No edema.     Left lower leg: No edema.   Skin:    General: Skin is warm and dry.     Coloration: Skin is pale.   Neurological:     Mental Status:  She is alert.     Labs: Recent Labs    12/27/23 1130 12/27/23 1139  WBC 5.8  --   HGB 9.4* 10.2*  HCT 31.2* 30.0*  PLT 170  --    BMET Recent Labs    12/27/23 1130 12/27/23 1139  NA 140 142  K 3.4* 3.1*  CL 103 101  CO2 28  --   GLUCOSE 120* 120*  BUN 11 9  CREATININE 0.52 0.60  CALCIUM  8.4*  --    LFT Recent Labs    12/27/23 1130  PROT 6.1*  ALBUMIN 3.5  AST 23  ALT 8  ALKPHOS 65  BILITOT 0.8   PT/INR No results for input(s): LABPROT, INR in the last 72 hours.  Diagnostic imaging: No results found.  IMPRESSION: Painless hematochezia  -Video capsule endoscopy 11/18/23 with findings of blood in cecum/colon based on my review Personal history colon polyps 09/2023 (20 mm ascending removed piecemeal via hot snare) Personal history unspecified colitis on colonoscopy 09/2023   -Consideration for ischemic colitis, though benign abdomen on exam today Normocytic, blood loss anemia  PLAN: -Check CT angiogram of abdomen -May need colonoscopy given presentation and findings on video capsule endoscopy and history of polyps and colitis, discussed with patient, she is agreeable to complete if needed -Clear liquid diet until CTA completed -Trend H/H, transfuse for Hgb < 7 -[facility] will follow   LOS: 0 days   Estefana Keas, [facility] Gastroenterology

## 2023-12-28 ENCOUNTER — Encounter (HOSPITAL_COMMUNITY): Payer: Self-pay | Admitting: Internal Medicine

## 2023-12-28 ENCOUNTER — Encounter (HOSPITAL_COMMUNITY)
Admission: EM | Disposition: A | Discharge status: Skilled Nursing Facility | Payer: Self-pay | Source: Skilled Nursing Facility | Attending: Internal Medicine

## 2023-12-28 ENCOUNTER — Observation Stay (HOSPITAL_COMMUNITY)

## 2023-12-28 DIAGNOSIS — K5731 Diverticulosis of large intestine without perforation or abscess with bleeding: Secondary | ICD-10-CM | POA: Diagnosis not present

## 2023-12-28 DIAGNOSIS — I110 Hypertensive heart disease with heart failure: Secondary | ICD-10-CM | POA: Diagnosis not present

## 2023-12-28 DIAGNOSIS — Z66 Do not resuscitate: Secondary | ICD-10-CM | POA: Diagnosis not present

## 2023-12-28 DIAGNOSIS — M159 Polyosteoarthritis, unspecified: Secondary | ICD-10-CM | POA: Diagnosis not present

## 2023-12-28 DIAGNOSIS — S32010D Wedge compression fracture of first lumbar vertebra, subsequent encounter for fracture with routine healing: Secondary | ICD-10-CM | POA: Diagnosis not present

## 2023-12-28 DIAGNOSIS — K219 Gastro-esophageal reflux disease without esophagitis: Secondary | ICD-10-CM | POA: Diagnosis not present

## 2023-12-28 DIAGNOSIS — M6281 Muscle weakness (generalized): Secondary | ICD-10-CM | POA: Diagnosis not present

## 2023-12-28 DIAGNOSIS — K922 Gastrointestinal hemorrhage, unspecified: Secondary | ICD-10-CM

## 2023-12-28 DIAGNOSIS — D62 Acute posthemorrhagic anemia: Secondary | ICD-10-CM | POA: Diagnosis not present

## 2023-12-28 DIAGNOSIS — H409 Unspecified glaucoma: Secondary | ICD-10-CM | POA: Diagnosis not present

## 2023-12-28 DIAGNOSIS — E876 Hypokalemia: Secondary | ICD-10-CM | POA: Diagnosis not present

## 2023-12-28 DIAGNOSIS — H3530 Unspecified macular degeneration: Secondary | ICD-10-CM | POA: Diagnosis not present

## 2023-12-28 DIAGNOSIS — R41841 Cognitive communication deficit: Secondary | ICD-10-CM | POA: Diagnosis not present

## 2023-12-28 DIAGNOSIS — Z87891 Personal history of nicotine dependence: Secondary | ICD-10-CM | POA: Diagnosis not present

## 2023-12-28 DIAGNOSIS — Z7401 Bed confinement status: Secondary | ICD-10-CM | POA: Diagnosis not present

## 2023-12-28 DIAGNOSIS — Z96653 Presence of artificial knee joint, bilateral: Secondary | ICD-10-CM | POA: Diagnosis not present

## 2023-12-28 DIAGNOSIS — Z743 Need for continuous supervision: Secondary | ICD-10-CM | POA: Diagnosis not present

## 2023-12-28 DIAGNOSIS — R000 Tachycardia, unspecified: Secondary | ICD-10-CM | POA: Diagnosis not present

## 2023-12-28 DIAGNOSIS — F419 Anxiety disorder, unspecified: Secondary | ICD-10-CM | POA: Diagnosis not present

## 2023-12-28 DIAGNOSIS — D649 Anemia, unspecified: Secondary | ICD-10-CM | POA: Diagnosis not present

## 2023-12-28 DIAGNOSIS — Z885 Allergy status to narcotic agent status: Secondary | ICD-10-CM | POA: Diagnosis not present

## 2023-12-28 DIAGNOSIS — K625 Hemorrhage of anus and rectum: Secondary | ICD-10-CM | POA: Diagnosis not present

## 2023-12-28 DIAGNOSIS — K9289 Other specified diseases of the digestive system: Secondary | ICD-10-CM | POA: Diagnosis not present

## 2023-12-28 DIAGNOSIS — K5791 Diverticulosis of intestine, part unspecified, without perforation or abscess with bleeding: Secondary | ICD-10-CM | POA: Diagnosis not present

## 2023-12-28 DIAGNOSIS — Z8249 Family history of ischemic heart disease and other diseases of the circulatory system: Secondary | ICD-10-CM | POA: Diagnosis not present

## 2023-12-28 DIAGNOSIS — N3946 Mixed incontinence: Secondary | ICD-10-CM | POA: Diagnosis not present

## 2023-12-28 DIAGNOSIS — Z7983 Long term (current) use of bisphosphonates: Secondary | ICD-10-CM | POA: Diagnosis not present

## 2023-12-28 DIAGNOSIS — G2581 Restless legs syndrome: Secondary | ICD-10-CM | POA: Diagnosis not present

## 2023-12-28 DIAGNOSIS — F339 Major depressive disorder, recurrent, unspecified: Secondary | ICD-10-CM | POA: Diagnosis not present

## 2023-12-28 DIAGNOSIS — E861 Hypovolemia: Secondary | ICD-10-CM | POA: Diagnosis not present

## 2023-12-28 DIAGNOSIS — E66811 Obesity, class 1: Secondary | ICD-10-CM | POA: Diagnosis not present

## 2023-12-28 DIAGNOSIS — M150 Primary generalized (osteo)arthritis: Secondary | ICD-10-CM | POA: Diagnosis not present

## 2023-12-28 DIAGNOSIS — I700 Atherosclerosis of aorta: Secondary | ICD-10-CM | POA: Diagnosis not present

## 2023-12-28 DIAGNOSIS — N3281 Overactive bladder: Secondary | ICD-10-CM | POA: Diagnosis not present

## 2023-12-28 DIAGNOSIS — K5900 Constipation, unspecified: Secondary | ICD-10-CM | POA: Diagnosis not present

## 2023-12-28 DIAGNOSIS — R2681 Unsteadiness on feet: Secondary | ICD-10-CM | POA: Diagnosis not present

## 2023-12-28 DIAGNOSIS — Z8719 Personal history of other diseases of the digestive system: Secondary | ICD-10-CM | POA: Diagnosis not present

## 2023-12-28 DIAGNOSIS — M8580 Other specified disorders of bone density and structure, unspecified site: Secondary | ICD-10-CM | POA: Diagnosis not present

## 2023-12-28 DIAGNOSIS — I5032 Chronic diastolic (congestive) heart failure: Secondary | ICD-10-CM | POA: Diagnosis not present

## 2023-12-28 DIAGNOSIS — J449 Chronic obstructive pulmonary disease, unspecified: Secondary | ICD-10-CM | POA: Diagnosis not present

## 2023-12-28 DIAGNOSIS — M818 Other osteoporosis without current pathological fracture: Secondary | ICD-10-CM | POA: Diagnosis not present

## 2023-12-28 DIAGNOSIS — F411 Generalized anxiety disorder: Secondary | ICD-10-CM | POA: Diagnosis not present

## 2023-12-28 DIAGNOSIS — M810 Age-related osteoporosis without current pathological fracture: Secondary | ICD-10-CM | POA: Diagnosis not present

## 2023-12-28 DIAGNOSIS — F32A Depression, unspecified: Secondary | ICD-10-CM | POA: Diagnosis not present

## 2023-12-28 DIAGNOSIS — Z85828 Personal history of other malignant neoplasm of skin: Secondary | ICD-10-CM | POA: Diagnosis not present

## 2023-12-28 DIAGNOSIS — Z886 Allergy status to analgesic agent status: Secondary | ICD-10-CM | POA: Diagnosis not present

## 2023-12-28 DIAGNOSIS — E785 Hyperlipidemia, unspecified: Secondary | ICD-10-CM | POA: Diagnosis not present

## 2023-12-28 HISTORY — PX: IR EMBO ART  VEN HEMORR LYMPH EXTRAV  INC GUIDE ROADMAPPING: IMG5450

## 2023-12-28 HISTORY — PX: IR US GUIDE VASC ACCESS RIGHT: IMG2390

## 2023-12-28 HISTORY — PX: IR ANGIOGRAM SELECTIVE EACH ADDITIONAL VESSEL: IMG667

## 2023-12-28 HISTORY — PX: IR ANGIOGRAM VISCERAL SELECTIVE: IMG657

## 2023-12-28 LAB — IRON AND TIBC
Iron: 231 ug/dL — ABNORMAL HIGH (ref 28–170)
Saturation Ratios: 85 % — ABNORMAL HIGH (ref 10.4–31.8)
TIBC: 273 ug/dL (ref 250–450)
UIBC: 42 ug/dL

## 2023-12-28 LAB — CBC WITH DIFFERENTIAL/PLATELET
Abs Immature Granulocytes: 0.04 10*3/uL (ref 0.00–0.07)
Basophils Absolute: 0 10*3/uL (ref 0.0–0.1)
Basophils Relative: 0 %
Eosinophils Absolute: 0 10*3/uL (ref 0.0–0.5)
Eosinophils Relative: 1 %
HCT: 26.7 % — ABNORMAL LOW (ref 36.0–46.0)
Hemoglobin: 8.2 g/dL — ABNORMAL LOW (ref 12.0–15.0)
Immature Granulocytes: 1 %
Lymphocytes Relative: 10 %
Lymphs Abs: 0.8 10*3/uL (ref 0.7–4.0)
MCH: 29.9 pg (ref 26.0–34.0)
MCHC: 30.7 g/dL (ref 30.0–36.0)
MCV: 97.4 fL (ref 80.0–100.0)
Monocytes Absolute: 0.5 10*3/uL (ref 0.1–1.0)
Monocytes Relative: 6 %
Neutro Abs: 7.1 10*3/uL (ref 1.7–7.7)
Neutrophils Relative %: 82 %
Platelets: 121 10*3/uL — ABNORMAL LOW (ref 150–400)
RBC: 2.74 MIL/uL — ABNORMAL LOW (ref 3.87–5.11)
RDW: 18.6 % — ABNORMAL HIGH (ref 11.5–15.5)
WBC: 8.5 10*3/uL (ref 4.0–10.5)
nRBC: 0.4 % — ABNORMAL HIGH (ref 0.0–0.2)

## 2023-12-28 LAB — COMPREHENSIVE METABOLIC PANEL WITH GFR
ALT: 9 U/L (ref 0–44)
AST: 20 U/L (ref 15–41)
Albumin: 2.9 g/dL — ABNORMAL LOW (ref 3.5–5.0)
Alkaline Phosphatase: 49 U/L (ref 38–126)
Anion gap: 8 (ref 5–15)
BUN: 10 mg/dL (ref 8–23)
CO2: 25 mmol/L (ref 22–32)
Calcium: 7.7 mg/dL — ABNORMAL LOW (ref 8.9–10.3)
Chloride: 107 mmol/L (ref 98–111)
Creatinine, Ser: 0.45 mg/dL (ref 0.44–1.00)
GFR, Estimated: >60 mL/min (ref >60)
Glucose, Bld: 119 mg/dL — ABNORMAL HIGH (ref 70–99)
Potassium: 3.4 mmol/L — ABNORMAL LOW (ref 3.5–5.1)
Sodium: 140 mmol/L (ref 135–145)
Total Bilirubin: 1.9 mg/dL — ABNORMAL HIGH (ref 0.0–1.2)
Total Protein: 4.9 g/dL — ABNORMAL LOW (ref 6.5–8.1)

## 2023-12-28 LAB — MAGNESIUM: Magnesium: 1.9 mg/dL (ref 1.7–2.4)

## 2023-12-28 LAB — PREPARE RBC (CROSSMATCH)

## 2023-12-28 LAB — HEMOGLOBIN AND HEMATOCRIT, BLOOD
HCT: 26.1 % — ABNORMAL LOW (ref 36.0–46.0)
HCT: 25.7 % — ABNORMAL LOW (ref 36.0–46.0)
Hemoglobin: 8.1 g/dL — ABNORMAL LOW (ref 12.0–15.0)
Hemoglobin: 8 g/dL — ABNORMAL LOW (ref 12.0–15.0)

## 2023-12-28 LAB — MRSA NEXT GEN BY PCR, NASAL: MRSA by PCR Next Gen: NOT DETECTED

## 2023-12-28 LAB — FERRITIN: Ferritin: 27 ng/mL (ref 11–307)

## 2023-12-28 SURGERY — COLONOSCOPY
Anesthesia: Monitor Anesthesia Care

## 2023-12-28 MED ORDER — FENTANYL CITRATE (PF) 100 MCG/2ML IJ SOLN
INTRAMUSCULAR | Status: AC | PRN
  Administered 2023-12-28: 50 ug via INTRAVENOUS
  Administered 2023-12-28 (×2): 25 ug via INTRAVENOUS

## 2023-12-28 MED ORDER — CHLORHEXIDINE GLUCONATE CLOTH 2 % EX PADS
6.0000 | MEDICATED_PAD | Freq: Every day | CUTANEOUS | Status: DC
  Administered 2023-12-28 – 2023-12-30 (×3): 6 via TOPICAL

## 2023-12-28 MED ORDER — PANTOPRAZOLE SODIUM 40 MG IV SOLR
40.0000 mg | Freq: Two times a day (BID) | INTRAVENOUS | Status: DC
  Administered 2023-12-28 – 2024-01-01 (×9): 40 mg via INTRAVENOUS
  Filled 2023-12-28 (×9): qty 10

## 2023-12-28 MED ORDER — LORAZEPAM 2 MG/ML IJ SOLN
0.5000 mg | Freq: Once | INTRAMUSCULAR | Status: AC
  Administered 2023-12-28: 0.5 mg via INTRAVENOUS
  Filled 2023-12-28: qty 1

## 2023-12-28 MED ORDER — MAGNESIUM SULFATE 2 GM/50ML IV SOLN
2.0000 g | Freq: Once | INTRAVENOUS | Status: AC
  Administered 2023-12-28: 2 g via INTRAVENOUS
  Filled 2023-12-28: qty 50

## 2023-12-28 MED ORDER — FENTANYL CITRATE (PF) 100 MCG/2ML IJ SOLN
INTRAMUSCULAR | Status: AC
  Filled 2023-12-28: qty 2

## 2023-12-28 MED ORDER — ORAL CARE MOUTH RINSE: 15.0000 mL | OROMUCOSAL | Status: DC | PRN

## 2023-12-28 MED ORDER — UMECLIDINIUM-VILANTEROL 62.5-25 MCG/ACT IN AEPB
1.0000 | INHALATION_SPRAY | Freq: Every morning | RESPIRATORY_TRACT | Status: DC
  Administered 2023-12-28 – 2024-01-01 (×5): 1 via RESPIRATORY_TRACT
  Filled 2023-12-28: qty 14

## 2023-12-28 MED ORDER — IOHEXOL 300 MG/ML  SOLN: 100.0000 mL | Freq: Once | INTRAMUSCULAR | Status: DC | PRN

## 2023-12-28 MED ORDER — IOHEXOL 300 MG/ML  SOLN
100.0000 mL | Freq: Once | INTRAMUSCULAR | Status: AC | PRN
Start: 2023-12-28 — End: 2023-12-28
  Administered 2023-12-28: 45 mL via INTRA_ARTERIAL

## 2023-12-28 MED ORDER — MIDAZOLAM HCL 2 MG/2ML IJ SOLN
INTRAMUSCULAR | Status: AC
  Filled 2023-12-28: qty 2

## 2023-12-28 MED ORDER — LIDOCAINE-EPINEPHRINE 1 %-1:100000 IJ SOLN
20.0000 mL | Freq: Once | INTRAMUSCULAR | Status: AC
  Administered 2023-12-28: 20 mL

## 2023-12-28 MED ORDER — MIDAZOLAM HCL 2 MG/2ML IJ SOLN
INTRAMUSCULAR | Status: AC | PRN
  Administered 2023-12-28 (×2): 1 mg via INTRAVENOUS
  Administered 2023-12-28 (×4): .5 mg via INTRAVENOUS

## 2023-12-28 MED ORDER — LIDOCAINE-EPINEPHRINE 1 %-1:100000 IJ SOLN
INTRAMUSCULAR | Status: AC
Start: 2023-12-28 — End: 2023-12-28
  Filled 2023-12-28: qty 1

## 2023-12-28 MED ORDER — POTASSIUM CHLORIDE 10 MEQ/100ML IV SOLN
10.0000 meq | INTRAVENOUS | Status: AC
  Administered 2023-12-28 (×4): 10 meq via INTRAVENOUS
  Filled 2023-12-28 (×4): qty 100

## 2023-12-28 MED ORDER — MIDAZOLAM HCL 2 MG/2ML IJ SOLN
INTRAMUSCULAR | Status: AC
Start: 2023-12-28 — End: 2023-12-28
  Filled 2023-12-28: qty 2

## 2023-12-28 MED ORDER — LEVALBUTEROL HCL 1.25 MG/0.5ML IN NEBU: 1.2500 mg | INHALATION_SOLUTION | RESPIRATORY_TRACT | Status: DC | PRN

## 2023-12-28 NOTE — ED Provider Notes (Signed)
 Physical Exam  BP (!) 144/55 (BP Location: Left Arm)   Pulse 99   Temp 98.3 F (36.8 C) (Oral)   Resp 12   Ht 5' 1 (1.549 m)   Wt 75 kg   SpO2 100%   BMI 31.24 kg/m   Physical Exam Vitals and nursing note reviewed.  Constitutional:      General: She is not in acute distress.    Appearance: She is well-developed. She is ill-appearing.  HENT:     Head: Normocephalic and atraumatic.   Eyes:     Conjunctiva/sclera: Conjunctivae normal.    Cardiovascular:     Rate and Rhythm: Regular rhythm. Tachycardia present.     Heart sounds: No murmur heard. Pulmonary:     Effort: Pulmonary effort is normal. No respiratory distress.     Breath sounds: Normal breath sounds.  Abdominal:     Palpations: Abdomen is soft.     Tenderness: There is no abdominal tenderness.   Musculoskeletal:        General: No swelling.     Cervical back: Neck supple.   Skin:    General: Skin is warm and dry.     Capillary Refill: Capillary refill takes less than 2 seconds.   Neurological:     Mental Status: She is alert.   Psychiatric:        Mood and Affect: Mood normal.     Procedures  .Critical Care  Performed by: Albertina Dixon, MD Authorized by: Albertina Dixon, MD   Critical care provider statement:    Critical care time (minutes):  80   Critical care was necessary to treat or prevent imminent or life-threatening deterioration of the following conditions: GIB requiring transfusion.   Critical care was time spent personally by me on the following activities:  Development of treatment plan with patient or surrogate, discussions with consultants, evaluation of patient's response to treatment, examination of patient, ordering and review of laboratory studies, ordering and review of radiographic studies, ordering and performing treatments and interventions, pulse oximetry, re-evaluation of patient's condition and review of old charts   ED Course / MDM   Clinical Course as of 12/28/23 0112   Sat Dec 27, 2023  1523 [place] [MK]    Clinical Course User Index [MK] Albertina Dixon, MD   Medical Decision Making Amount and/or Complexity of Data Reviewed Labs: ordered. Radiology: ordered.  Risk Prescription drug management. Decision regarding hospitalization.   Patient received in handoff.  Suspected lower GI bleed pending CT angiography.  CT angio positive for active GI bleeding involving the descending colon likely diverticular bleed.  I spoke with the interventional radiologist on-call Dr. Will who is recommending coag study evaluation to look for possible reversible cause of GI bleeding and no emergent intervention unless patient becomes more hemodynamically unstable.  Coags are normal.  Patient continues to have multiple bloody bowel movements here in the emergency department with frank melena and pressures are soft in the 90s with rapid tachycardia between 120-130.  Initial CBC with a hemoglobin of 9.4 downtrending from previous, repeat H&H 8.3.  Fluid resuscitation administered and blood ordered but patient has antibodies would require blood to come from [place] and this will take multiple hours.  I consulted the ICU Dr. Maree who came to evaluate the patient and is declining ICU admission as she is not unstable enough to be admitted to the ICU despite softer blood pressures, active GI bleeding and rapid tachycardia.  Recommending stepdown admission.  I spoke with the  initial admitting hospitalist Dr. Herschell who is concerned about the patient's vital sign abnormalities in the setting of active bleeding.  Patient again continues to have bloody bowel movements in the emergency department and vital signs largely unchanged despite fluid resuscitation.  I called the interventional radiologist back again who states that he is in the middle of a ECMO case in the ICU which rightly takes precedence and he would discuss with the ICU attending.  I spoke with the ICU attending again who again  is declining ICU admission.  I spoke with the nighttime hospitalist who agreed to admit the patient to stepdown with a low threshold for escalation of care.  The patient's third H&H after 8 hours under my care and multiple melanotic BMs in the ER again is downtrending to 6.4.  I spoke with the blood bank who was able to find 1 unit of emergency blood that does have the patient's antibodies and this was emergently transfused in the ER.  I then called the interventional radiologist again as there is only 1 unit of blood available to resuscitate this patient that will be available in the next 2 hours and as she is having active bleeding with downtrending hemoglobin, soft blood pressures and tachycardia, I am concerned that she will decompensate overnight if she does not have an embolization performed.  He then agreed to take the patient to the IR suite for embolization.       Albertina Dixon, MD 12/28/23 [id number]

## 2023-12-28 NOTE — Progress Notes (Addendum)
 PROGRESS NOTE  [name] FMW:[id number] DOB: 10-15-1940 DOA: 12/27/2023 PCP: Gil Greig BRAVO, NP   LOS: 0 days   Brief narrative:  [name] is a 83 y.o. female with past medical history significant for chronic iron  deficiency anemia with baseline hemoglobin 8-10, COPD, presented hospital with bright red blood per rectum treated for himself at home.  Baseline hemoglobin around 8-10.  In the ED patient was slightly tachypneic.  Labs showed mild hypokalemia with potassium of 3.4.  Hemoglobin was 9.4 followed by 8.3 and 6.4.  CTA GI bleed study showed evidence of active contrast extravasation into the descending colon suggestive of active diverticular bleed.  GI was consulted and subsequently patient was taken for embolization by IR.  Received 1 unit of packed RBC and IV fluid bolus.  Assessment/Plan: Principal Problem:   Acute lower GI bleeding Active Problems:   Hyperlipidemia   COPD (chronic obstructive pulmonary disease) (HCC)   Restless leg syndrome   Hypertension   GERD (gastroesophageal reflux disease)   GAD (generalized anxiety disorder)   Chronic heart failure with preserved ejection fraction (HFpEF) (HCC)   Acute on chronic blood loss anemia   Hypokalemia   Acute lower gastrointestinal bleed:  1 day history of new onset hematochezia with 8-9 total episodes.  CTA showed active extravasation in the descending colon.  Patient was taken by IR for embolization.  Currently has not had further bowel movements.  Received 1 unit of packed RBC.  Will continue to monitor closely latest hemoglobin of 8.1.     Acute blood loss anemia superimposed on chronic iron  deficiency anemia:  baseline hemoglobin 8-10, initial hemoglobin of 6.4, in the setting of presenting acute lower gastrointestinal bleed.  Received 1 unit of packed RBC with improvement in hemoglobin to 8.1..  No further blood loss reported at this time   Mild hypokalemia.  Will replac with IV KCl.     Latest potassium of 3.4  today.  Check levels in AM.    COPD: On Anoro Ellipta .  Appears compensated.  Continue nebulizers while in hospital  Debility deconditioning weakness.  Patient is from assisted living facility and lives with her husband.  Will get PT OT evaluation while in the hospital.  DVT prophylaxis: SCDs Start: 12/27/23 2345   Disposition: Assisted living facility likely in 1 to 2 days, will get PT evaluation  Status is: Observation The patient will require care spanning > 2 midnights and should be moved to inpatient because: Status post IR embolization, patient from assisted living facility, hypokalemia, need PT evaluation.    Code Status:     Code Status: Do not attempt resuscitation (DNR) PRE-ARREST INTERVENTIONS DESIRED  Family Communication: None at bedside.  I tried to reach out to the patient's spouse and son on the phone but was unable to reach out to them despite multiple attempts.  Consultants: GI Interventional radiology PCCM  Procedures: IR guided coil embolization of inferior mesenteric branch artery on 12/27/2023  Anti-infectives:  None  Anti-infectives (From admission, onward)    None      Subjective: Today, patient was seen and examined at bedside.  Patient has mild abdominal discomfort but no nausea vomiting or bloody bowel movements.  Has had bowel movement early this morning.  Has a walker for ambulation and lives with her husband at her assisted living facility.  Objective: Vitals:   12/28/23 0800 12/28/23 0822  BP: (!) 125/38   Pulse: (!) 109   Resp: (!) 22   Temp:  98.1 F (36.7 C)  SpO2: 95%     Intake/Output Summary (Last 24 hours) at 12/28/2023 1146 Last data filed at 12/28/2023 1145 Gross per 24 hour  Intake 3925.65 ml  Output 1500 ml  Net 2425.65 ml   Filed Weights   12/27/23 0947 12/28/23 0257  Weight: 75 kg 76.7 kg   Body mass index is 31.95 kg/m.   Physical Exam: GENERAL: Patient is alert awake and oriented. Not in obvious distress.   Obese build, elderly female, appears chronically deconditioned. HENT: Pallor noted pupils equally reactive to light. Oral mucosa is moist NECK: is supple, no gross swelling noted. CHEST: Clear to auscultation. No crackles or wheezes.  Diminished breath sounds bilaterally. CVS: S1 and S2 heard, no murmur. Regular rate and rhythm.  ABDOMEN: Soft, mild tenderness of the right lower quadrant bowel sounds are present. EXTREMITIES: No edema. CNS: Cranial nerves are intact.  Moves all extremities, generalized weakness noted SKIN: warm and dry without rashes.  Data Review: I have personally reviewed the following laboratory data and studies,  CBC: Recent Labs  Lab 12/27/23 1130 12/27/23 1139 12/27/23 1753 12/27/23 2317 12/28/23 0411 12/28/23 0843  WBC 5.8  --   --   --  8.5  --   NEUTROABS 4.0  --   --   --  7.1  --   HGB 9.4* 10.2* 8.3* 6.4* 8.2* 8.1*  HCT 31.2* 30.0* 27.2* 21.2* 26.7* 26.1*  MCV 99.7  --   --   --  97.4  --   PLT 170  --   --   --  121*  --    Basic Metabolic Panel: Recent Labs  Lab 12/27/23 1130 12/27/23 1139 12/28/23 0411  NA 140 142 140  K 3.4* 3.1* 3.4*  CL 103 101 107  CO2 28  --  25  GLUCOSE 120* 120* 119*  BUN 11 9 10   CREATININE 0.52 0.60 0.45  CALCIUM  8.4*  --  7.7*  MG  --   --  1.9   Liver Function Tests: Recent Labs  Lab 12/27/23 1130 12/28/23 0411  AST 23 20  ALT 8 9  ALKPHOS 65 49  BILITOT 0.8 1.9*  PROT 6.1* 4.9*  ALBUMIN 3.5 2.9*   No results for input(s): LIPASE, AMYLASE in the last 168 hours. No results for input(s): AMMONIA in the last 168 hours. Cardiac Enzymes: No results for input(s): CKTOTAL, CKMB, CKMBINDEX, TROPONINI in the last 168 hours. BNP (last 3 results) Recent Labs    10/25/23 0510  BNP 46.6    ProBNP (last 3 results) No results for input(s): PROBNP in the last 8760 hours.  CBG: No results for input(s): GLUCAP in the last 168 hours. Recent Results (from the past 240 hours)  MRSA Next  Gen by PCR, Nasal     Status: None   Collection Time: 12/28/23  2:57 AM   Specimen: Nasal Mucosa; Nasal Swab  Result Value Ref Range Status   MRSA by PCR Next Gen NOT DETECTED NOT DETECTED Final    Comment: (NOTE) The GeneXpert MRSA Assay (FDA approved for NASAL specimens only), is one component of a comprehensive MRSA colonization surveillance program. It is not intended to diagnose MRSA infection nor to guide or monitor treatment for MRSA infections. Test performance is not FDA approved in patients less than 42 years old. Performed at [facility], [address], [place], [place] [address]      Studies: IR EMBO ART  VEN HEMORR LYMPH EXTRAV  INC GUIDE ROADMAPPING Result Date: 12/28/2023 INDICATION: Diverticular bleed.  Bright red blood per rectum. EXAM: Title; MESENTERIC ARTERIOGRAPHY and COIL EMBOLIZATION for LOWER GI BLEED Listed procedures; 1. ULTRASOUND GUIDANCE for RIGHT COMMON FEMORAL ARTERIAL ACCESS 2. INFERIOR MESENTERIC ARTERY ARTERIOGRAPHY 3. COIL EMBOLIZATION of a DISTAL LEFT COLIC BRANCH VESSEL at iDESCENDING COLON COMPARISON:  CTA AP, 12/27/2023 and 04/19/2023 MEDICATIONS: None ANESTHESIA/SEDATION: Moderate (conscious) sedation was employed during this procedure. A total of Versed  4 mg and Fentanyl  100 mcg was administered intravenously. Moderate Sedation Time: 60 minutes. The patient's level of consciousness and vital signs were monitored continuously by radiology nursing throughout the procedure under my direct supervision. CONTRAST:  45 mL Omnipaque  300 FLUOROSCOPY: Radiation Exposure Index and estimated peak skin dose (PSD); Reference air kerma (RAK), 211 mGy. COMPLICATIONS: None immediate. PROCEDURE: Informed consent was obtained from the patient and/or patient's representative following explanation of the procedure, risks, benefits and alternatives. All questions were addressed. A time out was performed prior to the initiation of the procedure. Maximal  barrier sterile technique utilized including caps, mask, sterile gowns, sterile gloves, large sterile drape, hand hygiene, and Betadine prep. The RIGHT femoral head was marked fluoroscopically. Under sterile conditions and local anesthesia, the RIGHT common femoral artery access was performed with a micropuncture needle. Under direct ultrasound guidance, the RIGHT common femoral was accessed with a micropuncture kit. An ultrasound image was saved for documentation purposes. This allowed for placement of a 5 Fr vascular sheath. A limited arteriogram was performed through the side arm of the sheath confirming appropriate access within the RIGHT common femoral artery. Over a Bentson wire, a Mickelson catheter was advanced, reformed, back bled and flushed. The catheter was then utilized to select the inferior mesenteric artery and a selective inferior mesenteric arteriogram was performed. Using a 2.1 Fr Progreat alpha microcatheter and microwire, access into the distal LEFT colic artery branch vessels was performed and selective arteriography was obtained to identify the bleeding vessel. Super selective coil embolization was then performed using a single non detachable micro coil. A post embolization arteriogram was performed without evidence of residual extravasation. Images were reviewed and the procedure was terminated. All wires, catheters and sheaths were removed from the patient. Hemostasis was achieved at the RIGHT groin access site with Angio-Seal closure device. The patient tolerated the procedure well without immediate post procedural complication. FINDINGS: - access via the RIGHT femoral artery. - No overt extravasation at the descending colon with suspected ooze from IMA territory, distal LEFT colic branch vessel - Successful embolization of the target vessel with a single micro coil - Angio-Seal closure at RIGHT groin. Palpable RLE pulses at the end of the Case IMPRESSION: Successful super-selective coil  embolization of a target vessel at site of ooze from IMA territory, LEFT colic branch vessel, as above. PLAN: - The patient is to remain flat for 2 hours with RIGHT leg straight. - The patient may continue to experience several additional bloody bowel movements however should clear the rectal vault in the coming days. Thom Hall, MD [facility] Radiology Electronically Signed   By: Thom Hall M.D.   On: 12/28/2023 11:15   IR Angiogram Visceral Selective Result Date: 12/28/2023 INDICATION: Diverticular bleed.  Bright red blood per rectum. EXAM: Title; MESENTERIC ARTERIOGRAPHY and COIL EMBOLIZATION for LOWER GI BLEED Listed procedures; 1. ULTRASOUND GUIDANCE for RIGHT COMMON FEMORAL ARTERIAL ACCESS 2. INFERIOR MESENTERIC ARTERY ARTERIOGRAPHY 3. COIL EMBOLIZATION of a DISTAL LEFT COLIC BRANCH VESSEL at iDESCENDING COLON COMPARISON:  CTA AP, 12/27/2023 and 04/19/2023 MEDICATIONS: None ANESTHESIA/SEDATION: Moderate (conscious) sedation was employed during this procedure. A total of Versed  4 mg and Fentanyl  100 mcg was administered intravenously. Moderate Sedation Time: 60 minutes. The patient's level of consciousness and vital signs were monitored continuously by radiology nursing throughout the procedure under my direct supervision. CONTRAST:  45 mL Omnipaque  300 FLUOROSCOPY: Radiation Exposure Index and estimated peak skin dose (PSD); Reference air kerma (RAK), 211 mGy. COMPLICATIONS: None immediate. PROCEDURE: Informed consent was obtained from the patient and/or patient's representative following explanation of the procedure, risks, benefits and alternatives. All questions were addressed. A time out was performed prior to the initiation of the procedure. Maximal barrier sterile technique utilized including caps, mask, sterile gowns, sterile gloves, large sterile drape, hand hygiene, and Betadine prep. The RIGHT femoral head was marked fluoroscopically. Under  sterile conditions and local anesthesia, the RIGHT common femoral artery access was performed with a micropuncture needle. Under direct ultrasound guidance, the RIGHT common femoral was accessed with a micropuncture kit. An ultrasound image was saved for documentation purposes. This allowed for placement of a 5 Fr vascular sheath. A limited arteriogram was performed through the side arm of the sheath confirming appropriate access within the RIGHT common femoral artery. Over a Bentson wire, a Mickelson catheter was advanced, reformed, back bled and flushed. The catheter was then utilized to select the inferior mesenteric artery and a selective inferior mesenteric arteriogram was performed. Using a 2.1 Fr Progreat alpha microcatheter and microwire, access into the distal LEFT colic artery branch vessels was performed and selective arteriography was obtained to identify the bleeding vessel. Super selective coil embolization was then performed using a single non detachable micro coil. A post embolization arteriogram was performed without evidence of residual extravasation. Images were reviewed and the procedure was terminated. All wires, catheters and sheaths were removed from the patient. Hemostasis was achieved at the RIGHT groin access site with Angio-Seal closure device. The patient tolerated the procedure well without immediate post procedural complication. FINDINGS: - access via the RIGHT femoral artery. - No overt extravasation at the descending colon with suspected ooze from IMA territory, distal LEFT colic branch vessel - Successful embolization of the target vessel with a single micro coil - Angio-Seal closure at RIGHT groin. Palpable RLE pulses at the end of the Case IMPRESSION: Successful super-selective coil embolization of a target vessel at site of ooze from IMA territory, LEFT colic branch vessel, as above. PLAN: - The patient is to remain flat for 2 hours with RIGHT leg straight. - The patient may  continue to experience several additional bloody bowel movements however should clear the rectal vault in the coming days. Thom Hall, MD [facility] Radiology Electronically Signed   By: Thom Hall M.D.   On: 12/28/2023 11:15   IR Angiogram Selective Each Additional Vessel Result Date: 12/28/2023 INDICATION: Diverticular bleed.  Bright red blood per rectum. EXAM: Title; MESENTERIC ARTERIOGRAPHY and COIL EMBOLIZATION for LOWER GI BLEED Listed procedures; 1. ULTRASOUND GUIDANCE for RIGHT COMMON FEMORAL ARTERIAL ACCESS 2. INFERIOR MESENTERIC ARTERY ARTERIOGRAPHY 3. COIL EMBOLIZATION of a DISTAL LEFT COLIC BRANCH VESSEL at iDESCENDING COLON COMPARISON:  CTA AP, 12/27/2023 and 04/19/2023 MEDICATIONS: None ANESTHESIA/SEDATION: Moderate (conscious) sedation was employed during this procedure. A total of Versed  4 mg and Fentanyl  100 mcg was administered intravenously. Moderate Sedation Time: 60 minutes. The patient's level of consciousness and vital signs were monitored continuously by radiology nursing throughout the procedure under my direct supervision.  CONTRAST:  45 mL Omnipaque  300 FLUOROSCOPY: Radiation Exposure Index and estimated peak skin dose (PSD); Reference air kerma (RAK), 211 mGy. COMPLICATIONS: None immediate. PROCEDURE: Informed consent was obtained from the patient and/or patient's representative following explanation of the procedure, risks, benefits and alternatives. All questions were addressed. A time out was performed prior to the initiation of the procedure. Maximal barrier sterile technique utilized including caps, mask, sterile gowns, sterile gloves, large sterile drape, hand hygiene, and Betadine prep. The RIGHT femoral head was marked fluoroscopically. Under sterile conditions and local anesthesia, the RIGHT common femoral artery access was performed with a micropuncture needle. Under direct ultrasound guidance, the RIGHT common femoral was  accessed with a micropuncture kit. An ultrasound image was saved for documentation purposes. This allowed for placement of a 5 Fr vascular sheath. A limited arteriogram was performed through the side arm of the sheath confirming appropriate access within the RIGHT common femoral artery. Over a Bentson wire, a Mickelson catheter was advanced, reformed, back bled and flushed. The catheter was then utilized to select the inferior mesenteric artery and a selective inferior mesenteric arteriogram was performed. Using a 2.1 Fr Progreat alpha microcatheter and microwire, access into the distal LEFT colic artery branch vessels was performed and selective arteriography was obtained to identify the bleeding vessel. Super selective coil embolization was then performed using a single non detachable micro coil. A post embolization arteriogram was performed without evidence of residual extravasation. Images were reviewed and the procedure was terminated. All wires, catheters and sheaths were removed from the patient. Hemostasis was achieved at the RIGHT groin access site with Angio-Seal closure device. The patient tolerated the procedure well without immediate post procedural complication. FINDINGS: - access via the RIGHT femoral artery. - No overt extravasation at the descending colon with suspected ooze from IMA territory, distal LEFT colic branch vessel - Successful embolization of the target vessel with a single micro coil - Angio-Seal closure at RIGHT groin. Palpable RLE pulses at the end of the Case IMPRESSION: Successful super-selective coil embolization of a target vessel at site of ooze from IMA territory, LEFT colic branch vessel, as above. PLAN: - The patient is to remain flat for 2 hours with RIGHT leg straight. - The patient may continue to experience several additional bloody bowel movements however should clear the rectal vault in the coming days. Thom Hall, MD [facility]  [facility] Radiology Electronically Signed   By: Thom Hall M.D.   On: 12/28/2023 11:15   IR US  Guide Vasc Access Right Result Date: 12/28/2023 INDICATION: Diverticular bleed.  Bright red blood per rectum. EXAM: Title; MESENTERIC ARTERIOGRAPHY and COIL EMBOLIZATION for LOWER GI BLEED Listed procedures; 1. ULTRASOUND GUIDANCE for RIGHT COMMON FEMORAL ARTERIAL ACCESS 2. INFERIOR MESENTERIC ARTERY ARTERIOGRAPHY 3. COIL EMBOLIZATION of a DISTAL LEFT COLIC BRANCH VESSEL at iDESCENDING COLON COMPARISON:  CTA AP, 12/27/2023 and 04/19/2023 MEDICATIONS: None ANESTHESIA/SEDATION: Moderate (conscious) sedation was employed during this procedure. A total of Versed  4 mg and Fentanyl  100 mcg was administered intravenously. Moderate Sedation Time: 60 minutes. The patient's level of consciousness and vital signs were monitored continuously by radiology nursing throughout the procedure under my direct supervision. CONTRAST:  45 mL Omnipaque  300 FLUOROSCOPY: Radiation Exposure Index and estimated peak skin dose (PSD); Reference air kerma (RAK), 211 mGy. COMPLICATIONS: None immediate. PROCEDURE: Informed consent was obtained from the patient and/or patient's representative following explanation of the procedure, risks, benefits and alternatives. All questions were addressed. A time out was performed prior  to the initiation of the procedure. Maximal barrier sterile technique utilized including caps, mask, sterile gowns, sterile gloves, large sterile drape, hand hygiene, and Betadine prep. The RIGHT femoral head was marked fluoroscopically. Under sterile conditions and local anesthesia, the RIGHT common femoral artery access was performed with a micropuncture needle. Under direct ultrasound guidance, the RIGHT common femoral was accessed with a micropuncture kit. An ultrasound image was saved for documentation purposes. This allowed for placement of a 5 Fr vascular sheath. A limited arteriogram was performed through the side arm of  the sheath confirming appropriate access within the RIGHT common femoral artery. Over a Bentson wire, a Mickelson catheter was advanced, reformed, back bled and flushed. The catheter was then utilized to select the inferior mesenteric artery and a selective inferior mesenteric arteriogram was performed. Using a 2.1 Fr Progreat alpha microcatheter and microwire, access into the distal LEFT colic artery branch vessels was performed and selective arteriography was obtained to identify the bleeding vessel. Super selective coil embolization was then performed using a single non detachable micro coil. A post embolization arteriogram was performed without evidence of residual extravasation. Images were reviewed and the procedure was terminated. All wires, catheters and sheaths were removed from the patient. Hemostasis was achieved at the RIGHT groin access site with Angio-Seal closure device. The patient tolerated the procedure well without immediate post procedural complication. FINDINGS: - access via the RIGHT femoral artery. - No overt extravasation at the descending colon with suspected ooze from IMA territory, distal LEFT colic branch vessel - Successful embolization of the target vessel with a single micro coil - Angio-Seal closure at RIGHT groin. Palpable RLE pulses at the end of the Case IMPRESSION: Successful super-selective coil embolization of a target vessel at site of ooze from IMA territory, LEFT colic branch vessel, as above. PLAN: - The patient is to remain flat for 2 hours with RIGHT leg straight. - The patient may continue to experience several additional bloody bowel movements however should clear the rectal vault in the coming days. Thom Hall, MD [facility] Radiology Electronically Signed   By: Thom Hall M.D.   On: 12/28/2023 11:15   CT ANGIO GI BLEED Result Date: 12/27/2023 CLINICAL DATA:  Rectal bleeding.  Left upper abdominal pain. EXAM: CTA  ABDOMEN AND PELVIS WITHOUT AND WITH CONTRAST TECHNIQUE: Multidetector CT imaging of the abdomen and pelvis was performed using the standard protocol during bolus administration of intravenous contrast. Multiplanar reconstructed images and MIPs were obtained and reviewed to evaluate the vascular anatomy. RADIATION DOSE REDUCTION: This exam was performed according to the departmental dose-optimization program which includes automated exposure control, adjustment of the mA and/or kV according to patient size and/or use of iterative reconstruction technique. CONTRAST:  OMNIPAQUE  IOHEXOL  350 MG/ML SOLN COMPARISON:  None Available. FINDINGS: VASCULAR Aorta: Atherosclerotic disease in the abdominal aorta without aneurysm or dissection or significant stenosis. Celiac: Patent without evidence of aneurysm, dissection, vasculitis or significant stenosis. SMA: Patent without evidence of aneurysm, dissection, vasculitis or significant stenosis. Renals: Single bilateral renal arteries. Left renal artery is widely patent without aneurysm, dissection or stenosis. At least mild stenosis at origin of the right renal artery without aneurysm or dissection. IMA: Patent without evidence of aneurysm, dissection, vasculitis or significant stenosis. 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 Inflow: Patent without evidence of aneurysm, dissection, vasculitis or significant stenosis. Proximal Outflow: Proximal femoral arteries are patent bilaterally. Veins: Portal venous system  is patent. Normal appearance of the IVC and renal veins. Normal appearance of the iliac veins. Review of the MIP images confirms the above findings. NON-VASCULAR Lower chest: Peripheral reticular densities at the lung bases are suggestive for chronic changes. No pleural effusions. Hepatobiliary: Gallbladder is mildly distended with high-density sludge or small stones. No gallbladder inflammation. Main  portal venous system is patent. No biliary dilatation. Pancreas: Unremarkable. No pancreatic ductal dilatation or surrounding inflammatory changes. Spleen: Normal in size without focal abnormality. Adrenals/Urinary Tract: Normal adrenal glands. No kidney stones. Cyst in the left kidney upper pole. No hydronephrosis. No suspicious renal lesion. Normal urinary bladder. Stomach/Bowel: Positive for active GI bleeding involving the descending colon. 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 The area of arterial bleeding in the colon is distal to the previously embolized area. Extensive diverticular disease throughout the colon. Endoscopic clip in the right colon near the ileocecal valve. Normal appendix. Normal stomach. No bowel dilatation or obstruction. Lymphatic: No lymph node enlargement in the abdomen or pelvis. Reproductive: Uterus and bilateral adnexa are unremarkable. Other: Negative for ascites.  Negative for free air. Musculoskeletal: No acute bone abnormality. Old vertebral body compression fracture at L1. IMPRESSION: 1. Positive for active GI bleeding involving the descending colon. Findings are most compatible with a diverticular bleed. 2. 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 3. Extensive diverticular disease throughout the colon. 4. Gallbladder sludge or small stones. No gallbladder inflammation. 5. Aortic Atherosclerosis (ICD10-I70.0). These results were called by telephone at the time of interpretation on 12/27/2023 at 5:12 pm to provider Dr. Efrain, who verbally acknowledged these results. Electronically Signed   By: Juliene Balder M.D.   On: 12/27/2023 20:33      Vernal Alstrom, MD  [facility] 12/28/2023  If 7PM-7AM, please contact night-coverage

## 2023-12-28 NOTE — Progress Notes (Signed)
 [facility] Progress Note  SUBJECTIVE:   Interval history: [name] was seen and evaluated today at bedside. Hard of hearing, noted that [name] has some right sided abdominal discomfort. Appears somewhat confused. Per nursing, no bowel movement on day shift. Had IR embolization overnight. Colonoscopy that was planned for today was cancelled.  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, left side   Sleeping excessive    Tinnitus of both ears    Past Surgical History:  Procedure Laterality Date   BALLOON DILATION N/A 05/08/2021   Procedure: BALLOON DILATION;  Surgeon: Saintclair Jasper, MD;  Location: [facility] ENDOSCOPY;  Service: Gastroenterology;  Laterality: N/A;   BASAL CELL CARCINOMA EXCISION  2001   off my nose, left side   BIOPSY  05/08/2021   Procedure: BIOPSY;  Surgeon: Saintclair Jasper, MD;  Location: [facility] ENDOSCOPY;  Service: Gastroenterology;;   BIOPSY  04/25/2023   Procedure: BIOPSY;  Surgeon: Dianna Specking, MD;  Location: [facility] ENDOSCOPY;  Service: Gastroenterology;;   BLEPHAROPLASTY Bilateral    BONE BIOPSY  10/26/2023   Procedure: BIOPSY, GI;  Surgeon: Elicia Claw, MD;  Location: [facility] ENDOSCOPY;  Service: Gastroenterology;;   CATARACT EXTRACTION W/ INTRAOCULAR LENS  IMPLANT, BILATERAL Bilateral 1990's   COLONOSCOPY N/A 10/26/2023   Procedure: COLONOSCOPY;  Surgeon: Elicia Claw, MD;  Location: [facility] ENDOSCOPY;  Service: Gastroenterology;   Laterality: N/A;   COLONOSCOPY WITH PROPOFOL  N/A 05/04/2022   Procedure: COLONOSCOPY WITH PROPOFOL ;  Surgeon: Saintclair Jasper, MD;  Location: [facility] ENDOSCOPY;  Service: Gastroenterology;  Laterality: N/A;   COLONOSCOPY WITH PROPOFOL  N/A 04/25/2023   Procedure: COLONOSCOPY WITH PROPOFOL ;  Surgeon: Dianna Specking, MD;  Location: [facility] ENDOSCOPY;  Service: Gastroenterology;  Laterality: N/A;   ENTEROSCOPY N/A 11/20/2023   Procedure: ENTEROSCOPY;  Surgeon: Dianna Specking, MD;  Location: [facility] ENDOSCOPY;  Service: Gastroenterology;  Laterality: N/A;   ESOPHAGOGASTRODUODENOSCOPY N/A 10/26/2023   Procedure: EGD (ESOPHAGOGASTRODUODENOSCOPY);  Surgeon: Elicia Claw, MD;  Location: [facility]OSCOPY;  Service: Gastroenterology;  Laterality: N/A;   ESOPHAGOGASTRODUODENOSCOPY N/A 11/20/2023   Procedure: EGD (ESOPHAGOGASTRODUODENOSCOPY);  Surgeon: Dianna Specking, MD;  Location: [facility] ENDOSCOPY;  Service: Gastroenterology;  Laterality: N/A;   ESOPHAGOGASTRODUODENOSCOPY (EGD) WITH PROPOFOL  N/A 05/08/2021   Procedure: ESOPHAGOGASTRODUODENOSCOPY (EGD) WITH PROPOFOL ;  Surgeon: Saintclair Jasper, MD;  Location: [facility] ENDOSCOPY;  Service: Gastroenterology;  Laterality: N/A;   ESOPHAGOGASTRODUODENOSCOPY (EGD) WITH PROPOFOL  N/A 01/11/2022   Procedure: ESOPHAGOGASTRODUODENOSCOPY (EGD) WITH PROPOFOL ;  Surgeon: Rosalie Kitchens, MD;  Location: [facility] ENDOSCOPY;  Service: Gastroenterology;  Laterality: N/A;   ESOPHAGOGASTRODUODENOSCOPY (EGD) WITH PROPOFOL  N/A 01/06/2023   Procedure: ESOPHAGOGASTRODUODENOSCOPY (EGD) WITH PROPOFOL ;  Surgeon: Rosalie Kitchens, MD;  Location: [facility] ENDOSCOPY;  Service: Gastroenterology;  Laterality: N/A;   ESOPHAGOGASTRODUODENOSCOPY (EGD) WITH PROPOFOL  N/A 04/21/2023   Procedure: ESOPHAGOGASTRODUODENOSCOPY (EGD) WITH PROPOFOL ;  Surgeon: Dianna Specking, MD;  Location: [facility] ENDOSCOPY;  Service: Gastroenterology;  Laterality: N/A;   EYE SURGERY Bilateral  to improve vision after cataract OR   GIVENS CAPSULE STUDY N/A 04/21/2023    Procedure: GIVENS CAPSULE STUDY;  Surgeon: Dianna Specking, MD;  Location: [facility] ENDOSCOPY;  Service: Gastroenterology;  Laterality: N/A;   GIVENS CAPSULE STUDY N/A 11/18/2023   Procedure: IMAGING PROCEDURE, GI TRACT, INTRALUMINAL, VIA CAPSULE;  Surgeon: Dianna Specking, MD;  Location: [facility] ENDOSCOPY;  Service: Gastroenterology;  Laterality: N/A;   HEMOSTASIS CLIP PLACEMENT  10/26/2023   Procedure: CONTROL OF HEMORRHAGE, GI TRACT, ENDOSCOPIC, BY CLIPPING OR OVERSEWING;  Surgeon: Elicia Claw, MD;  Location: [facility] ENDOSCOPY;  Service: Gastroenterology;;   IR ANGIOGRAM FOLLOW UP STUDY  12/19/2020   IR ANGIOGRAM SELECTIVE EACH ADDITIONAL VESSEL  12/19/2020   IR ANGIOGRAM SELECTIVE EACH ADDITIONAL VESSEL  12/19/2020   IR ANGIOGRAM SELECTIVE EACH ADDITIONAL VESSEL  12/28/2023   IR ANGIOGRAM VISCERAL SELECTIVE  12/19/2020   IR ANGIOGRAM VISCERAL SELECTIVE  12/28/2023   IR EMBO ART  VEN HEMORR LYMPH EXTRAV  INC GUIDE ROADMAPPING  12/19/2020   IR EMBO ART  VEN HEMORR LYMPH EXTRAV  INC GUIDE ROADMAPPING  12/28/2023   IR US  GUIDE VASC ACCESS RIGHT  12/19/2020   IR US  GUIDE VASC ACCESS RIGHT  12/28/2023   JOINT REPLACEMENT     POLYPECTOMY  05/04/2022   Procedure: POLYPECTOMY;  Surgeon: Saintclair Jasper, MD;  Location: [facility] ENDOSCOPY;  Service: Gastroenterology;;   POLYPECTOMY  10/26/2023   Procedure: POLYPECTOMY, INTESTINE;  Surgeon: Elicia Claw, MD;  Location: [facility] ENDOSCOPY;  Service: Gastroenterology;;   STAPEDES SURGERY Left    scraped stapedes because it was sticking when it wasn't suppose to   TONSILLECTOMY AND ADENOIDECTOMY  1946   TOTAL KNEE ARTHROPLASTY Left ~ 2008   TOTAL KNEE ARTHROPLASTY  12/20/2011   Procedure: TOTAL KNEE ARTHROPLASTY;  Surgeon: Elspeth JONELLE Her, MD;  Location: [facility] OR;  Service: Orthopedics;  Laterality: Right;  Right Total Knee Arthroplasty   TUBAL LIGATION  1980's   Current Facility-Administered Medications  Medication Dose Route Frequency Provider Last Rate Last Admin   0.9 %   sodium chloride  infusion (Manually program via Guardrails IV Fluids)   Intravenous Once Kommor, Lum, MD   Held at 12/27/23 2203   0.9 %  sodium chloride  infusion (Manually program via Guardrails IV Fluids)   Intravenous Once Howerter, Justin B, DO   Held at 12/28/23 0019   acetaminophen  (TYLENOL ) tablet 650 mg  650 mg Oral Q6H PRN Howerter, Justin B, DO       Or   acetaminophen  (TYLENOL ) suppository 650 mg  650 mg Rectal Q6H PRN Howerter, Justin B, DO       Chlorhexidine  Gluconate Cloth 2 % PADS 6 each  6 each Topical Daily Howerter, Justin B, DO   6 each at 12/28/23 0257   iohexol  (OMNIPAQUE ) 300 MG/ML solution 100 mL  100 mL Intra-arterial Once PRN Hughes Simmonds, MD       levalbuterol  (XOPENEX ) nebulizer solution 1.25 mg  1.25 mg Nebulization Q4H PRN Howerter, Justin B, DO       magnesium  sulfate IVPB 2 g 50 mL  2 g Intravenous Once Ramaswamy, Murali, MD 50 mL/hr at 12/28/23 1145 Infusion Verify at 12/28/23 1145   ondansetron  (ZOFRAN ) injection 4 mg  4 mg Intravenous Q6H PRN Howerter, Justin B, DO       Oral care mouth rinse  15 mL Mouth Rinse PRN Howerter, Justin B, DO       pantoprazole  (PROTONIX ) injection 40 mg  40 mg Intravenous Q12H Howerter, Justin B, DO  40 mg at 12/28/23 9073   sodium chloride  flush (NS) 0.9 % injection 3-10 mL  3-10 mL Intravenous Q12H Kriss Estefana DEL, DO   10 mL at 12/28/23 9070   sodium chloride  flush (NS) 0.9 % injection 3-10 mL  3-10 mL Intravenous PRN Kriss Estefana DEL, DO       umeclidinium-vilanterol (ANORO ELLIPTA ) 62.5-25 MCG/ACT 1 puff  1 puff Inhalation q morning Howerter, Justin B, DO   1 puff at 12/28/23 9095   Allergies as of 12/27/2023 - Review Complete 12/27/2023  Allergen Reaction Noted   Morphine and codeine Itching 11/29/2011   Aspirin Other (See Comments) 10/22/2020   Duloxetine  hcl Other (See Comments) 10/22/2020   Tymlos [abaloparatide] Other (See Comments) 10/22/2020   Ventolin  [albuterol ] Other (See Comments) 12/25/2020   Cefadroxil  Hives 12/11/2011   Review of Systems:  Review of Systems  Gastrointestinal:  Positive for abdominal pain.    OBJECTIVE:   Temp:  [97.5 F (36.4 C)-98.3 F (36.8 C)] 98.1 F (36.7 C) (06/29 0822) Pulse Rate:  [84-133] 109 (06/29 0800) Resp:  [12-27] 22 (06/29 0800) BP: (93-160)/(38-112) 125/38 (06/29 0800) SpO2:  [90 %-100 %] 95 % (06/29 0800) Weight:  [76.7 kg] 76.7 kg (06/29 0257) Last BM Date : 12/28/23 Physical Exam Constitutional:      General: [name] is not in acute distress.    Appearance: [name] is not ill-appearing, toxic-appearing or diaphoretic.   Cardiovascular:     Rate and Rhythm: Normal rate and regular rhythm.  Pulmonary:     Effort: No respiratory distress.     Breath sounds: Normal breath sounds.  Abdominal:     General: There is no distension.     Palpations: Abdomen is soft.     Tenderness: There is no abdominal tenderness. There is no guarding.   Musculoskeletal:     Right lower leg: No edema.     Left lower leg: No edema.   Skin:    General: Skin is warm and dry.     Coloration: Skin is pale.   Neurological:     Mental Status: [name] is alert.     Labs: Recent Labs    12/27/23 1130 12/27/23 1139 12/27/23 2317 12/28/23 0411 12/28/23 0843  WBC 5.8  --   --  8.5  --   HGB 9.4*   < > 6.4* 8.2* 8.1*  HCT 31.2*   < > 21.2* 26.7* 26.1*  PLT 170  --   --  121*  --    < > = values in this interval not displayed.   BMET Recent Labs    12/27/23 1130 12/27/23 1139 12/28/23 0411  NA 140 142 140  K 3.4* 3.1* 3.4*  CL 103 101 107  CO2 28  --  25  GLUCOSE 120* 120* 119*  BUN 11 9 10   CREATININE 0.52 0.60 0.45  CALCIUM  8.4*  --  7.7*   LFT Recent Labs    12/28/23 0411  PROT 4.9*  ALBUMIN 2.9*  AST 20  ALT 9  ALKPHOS 49  BILITOT 1.9*   PT/INR Recent Labs    12/27/23 1753  LABPROT 15.4*  INR 1.2   Diagnostic imaging: IR EMBO ART  VEN HEMORR LYMPH EXTRAV  INC GUIDE ROADMAPPING Result Date: 12/28/2023 INDICATION: Diverticular bleed.   Bright red blood per rectum. EXAM: Title; MESENTERIC ARTERIOGRAPHY and COIL EMBOLIZATION for LOWER GI BLEED Listed procedures; 1. ULTRASOUND GUIDANCE for RIGHT COMMON FEMORAL ARTERIAL ACCESS 2. INFERIOR MESENTERIC ARTERY ARTERIOGRAPHY 3. COIL  EMBOLIZATION of a DISTAL LEFT COLIC BRANCH VESSEL at iDESCENDING COLON COMPARISON:  CTA AP, 12/27/2023 and 04/19/2023 MEDICATIONS: None ANESTHESIA/SEDATION: Moderate (conscious) sedation was employed during this procedure. A total of Versed  4 mg and Fentanyl  100 mcg was administered intravenously. Moderate Sedation Time: 60 minutes. The patient's level of consciousness and vital signs were monitored continuously by radiology nursing throughout the procedure under my direct supervision. CONTRAST:  45 mL Omnipaque  300 FLUOROSCOPY: Radiation Exposure Index and estimated peak skin dose (PSD); Reference air kerma (RAK), 211 mGy. COMPLICATIONS: None immediate. PROCEDURE: Informed consent was obtained from the patient and/or patient's representative following explanation of the procedure, risks, benefits and alternatives. All questions were addressed. A time out was performed prior to the initiation of the procedure. Maximal barrier sterile technique utilized including caps, mask, sterile gowns, sterile gloves, large sterile drape, hand hygiene, and Betadine prep. The RIGHT femoral head was marked fluoroscopically. Under sterile conditions and local anesthesia, the RIGHT common femoral artery access was performed with a micropuncture needle. Under direct ultrasound guidance, the RIGHT common femoral was accessed with a micropuncture kit. An ultrasound image was saved for documentation purposes. This allowed for placement of a 5 Fr vascular sheath. A limited arteriogram was performed through the side arm of the sheath confirming appropriate access within the RIGHT common femoral artery. Over a Bentson wire, a Mickelson catheter was advanced, reformed, back bled and flushed. The  catheter was then utilized to select the inferior mesenteric artery and a selective inferior mesenteric arteriogram was performed. Using a 2.1 Fr Progreat alpha microcatheter and microwire, access into the distal LEFT colic artery branch vessels was performed and selective arteriography was obtained to identify the bleeding vessel. Super selective coil embolization was then performed using a single non detachable micro coil. A post embolization arteriogram was performed without evidence of residual extravasation. Images were reviewed and the procedure was terminated. All wires, catheters and sheaths were removed from the patient. Hemostasis was achieved at the RIGHT groin access site with Angio-Seal closure device. The patient tolerated the procedure well without immediate post procedural complication. FINDINGS: - access via the RIGHT femoral artery. - No overt extravasation at the descending colon with suspected ooze from IMA territory, distal LEFT colic branch vessel - Successful embolization of the target vessel with a single micro coil - Angio-Seal closure at RIGHT groin. Palpable RLE pulses at the end of the Case IMPRESSION: Successful super-selective coil embolization of a target vessel at site of ooze from IMA territory, LEFT colic branch vessel, as above. PLAN: - The patient is to remain flat for 2 hours with RIGHT leg straight. - The patient may continue to experience several additional bloody bowel movements however should clear the rectal vault in the coming days. Thom Hall, MD [facility] Radiology Electronically Signed   By: Thom Hall M.D.   On: 12/28/2023 11:15   IR Angiogram Visceral Selective Result Date: 12/28/2023 INDICATION: Diverticular bleed.  Bright red blood per rectum. EXAM: Title; MESENTERIC ARTERIOGRAPHY and COIL EMBOLIZATION for LOWER GI BLEED Listed procedures; 1. ULTRASOUND GUIDANCE for RIGHT COMMON FEMORAL ARTERIAL ACCESS 2.  INFERIOR MESENTERIC ARTERY ARTERIOGRAPHY 3. COIL EMBOLIZATION of a DISTAL LEFT COLIC BRANCH VESSEL at iDESCENDING COLON COMPARISON:  CTA AP, 12/27/2023 and 04/19/2023 MEDICATIONS: None ANESTHESIA/SEDATION: Moderate (conscious) sedation was employed during this procedure. A total of Versed  4 mg and Fentanyl  100 mcg was administered intravenously. Moderate Sedation Time: 60 minutes. The patient's level of consciousness and vital signs were monitored  continuously by radiology nursing throughout the procedure under my direct supervision. CONTRAST:  45 mL Omnipaque  300 FLUOROSCOPY: Radiation Exposure Index and estimated peak skin dose (PSD); Reference air kerma (RAK), 211 mGy. COMPLICATIONS: None immediate. PROCEDURE: Informed consent was obtained from the patient and/or patient's representative following explanation of the procedure, risks, benefits and alternatives. All questions were addressed. A time out was performed prior to the initiation of the procedure. Maximal barrier sterile technique utilized including caps, mask, sterile gowns, sterile gloves, large sterile drape, hand hygiene, and Betadine prep. The RIGHT femoral head was marked fluoroscopically. Under sterile conditions and local anesthesia, the RIGHT common femoral artery access was performed with a micropuncture needle. Under direct ultrasound guidance, the RIGHT common femoral was accessed with a micropuncture kit. An ultrasound image was saved for documentation purposes. This allowed for placement of a 5 Fr vascular sheath. A limited arteriogram was performed through the side arm of the sheath confirming appropriate access within the RIGHT common femoral artery. Over a Bentson wire, a Mickelson catheter was advanced, reformed, back bled and flushed. The catheter was then utilized to select the inferior mesenteric artery and a selective inferior mesenteric arteriogram was performed. Using a 2.1 Fr Progreat alpha microcatheter and microwire, access into  the distal LEFT colic artery branch vessels was performed and selective arteriography was obtained to identify the bleeding vessel. Super selective coil embolization was then performed using a single non detachable micro coil. A post embolization arteriogram was performed without evidence of residual extravasation. Images were reviewed and the procedure was terminated. All wires, catheters and sheaths were removed from the patient. Hemostasis was achieved at the RIGHT groin access site with Angio-Seal closure device. The patient tolerated the procedure well without immediate post procedural complication. FINDINGS: - access via the RIGHT femoral artery. - No overt extravasation at the descending colon with suspected ooze from IMA territory, distal LEFT colic branch vessel - Successful embolization of the target vessel with a single micro coil - Angio-Seal closure at RIGHT groin. Palpable RLE pulses at the end of the Case IMPRESSION: Successful super-selective coil embolization of a target vessel at site of ooze from IMA territory, LEFT colic branch vessel, as above. PLAN: - The patient is to remain flat for 2 hours with RIGHT leg straight. - The patient may continue to experience several additional bloody bowel movements however should clear the rectal vault in the coming days. Thom Hall, MD [facility] Radiology Electronically Signed   By: Thom Hall M.D.   On: 12/28/2023 11:15   IR Angiogram Selective Each Additional Vessel Result Date: 12/28/2023 INDICATION: Diverticular bleed.  Bright red blood per rectum. EXAM: Title; MESENTERIC ARTERIOGRAPHY and COIL EMBOLIZATION for LOWER GI BLEED Listed procedures; 1. ULTRASOUND GUIDANCE for RIGHT COMMON FEMORAL ARTERIAL ACCESS 2. INFERIOR MESENTERIC ARTERY ARTERIOGRAPHY 3. COIL EMBOLIZATION of a DISTAL LEFT COLIC BRANCH VESSEL at iDESCENDING COLON COMPARISON:  CTA AP, 12/27/2023 and 04/19/2023 MEDICATIONS: None  ANESTHESIA/SEDATION: Moderate (conscious) sedation was employed during this procedure. A total of Versed  4 mg and Fentanyl  100 mcg was administered intravenously. Moderate Sedation Time: 60 minutes. The patient's level of consciousness and vital signs were monitored continuously by radiology nursing throughout the procedure under my direct supervision. CONTRAST:  45 mL Omnipaque  300 FLUOROSCOPY: Radiation Exposure Index and estimated peak skin dose (PSD); Reference air kerma (RAK), 211 mGy. COMPLICATIONS: None immediate. PROCEDURE: Informed consent was obtained from the patient and/or patient's representative following explanation of the procedure, risks, benefits and  alternatives. All questions were addressed. A time out was performed prior to the initiation of the procedure. Maximal barrier sterile technique utilized including caps, mask, sterile gowns, sterile gloves, large sterile drape, hand hygiene, and Betadine prep. The RIGHT femoral head was marked fluoroscopically. Under sterile conditions and local anesthesia, the RIGHT common femoral artery access was performed with a micropuncture needle. Under direct ultrasound guidance, the RIGHT common femoral was accessed with a micropuncture kit. An ultrasound image was saved for documentation purposes. This allowed for placement of a 5 Fr vascular sheath. A limited arteriogram was performed through the side arm of the sheath confirming appropriate access within the RIGHT common femoral artery. Over a Bentson wire, a Mickelson catheter was advanced, reformed, back bled and flushed. The catheter was then utilized to select the inferior mesenteric artery and a selective inferior mesenteric arteriogram was performed. Using a 2.1 Fr Progreat alpha microcatheter and microwire, access into the distal LEFT colic artery branch vessels was performed and selective arteriography was obtained to identify the bleeding vessel. Super selective coil embolization was then performed  using a single non detachable micro coil. A post embolization arteriogram was performed without evidence of residual extravasation. Images were reviewed and the procedure was terminated. All wires, catheters and sheaths were removed from the patient. Hemostasis was achieved at the RIGHT groin access site with Angio-Seal closure device. The patient tolerated the procedure well without immediate post procedural complication. FINDINGS: - access via the RIGHT femoral artery. - No overt extravasation at the descending colon with suspected ooze from IMA territory, distal LEFT colic branch vessel - Successful embolization of the target vessel with a single micro coil - Angio-Seal closure at RIGHT groin. Palpable RLE pulses at the end of the Case IMPRESSION: Successful super-selective coil embolization of a target vessel at site of ooze from IMA territory, LEFT colic branch vessel, as above. PLAN: - The patient is to remain flat for 2 hours with RIGHT leg straight. - The patient may continue to experience several additional bloody bowel movements however should clear the rectal vault in the coming days. Thom Hall, MD [facility] Radiology Electronically Signed   By: Thom Hall M.D.   On: 12/28/2023 11:15   IR US  Guide Vasc Access Right Result Date: 12/28/2023 INDICATION: Diverticular bleed.  Bright red blood per rectum. EXAM: Title; MESENTERIC ARTERIOGRAPHY and COIL EMBOLIZATION for LOWER GI BLEED Listed procedures; 1. ULTRASOUND GUIDANCE for RIGHT COMMON FEMORAL ARTERIAL ACCESS 2. INFERIOR MESENTERIC ARTERY ARTERIOGRAPHY 3. COIL EMBOLIZATION of a DISTAL LEFT COLIC BRANCH VESSEL at iDESCENDING COLON COMPARISON:  CTA AP, 12/27/2023 and 04/19/2023 MEDICATIONS: None ANESTHESIA/SEDATION: Moderate (conscious) sedation was employed during this procedure. A total of Versed  4 mg and Fentanyl  100 mcg was administered intravenously. Moderate Sedation Time: 60 minutes. The  patient's level of consciousness and vital signs were monitored continuously by radiology nursing throughout the procedure under my direct supervision. CONTRAST:  45 mL Omnipaque  300 FLUOROSCOPY: Radiation Exposure Index and estimated peak skin dose (PSD); Reference air kerma (RAK), 211 mGy. COMPLICATIONS: None immediate. PROCEDURE: Informed consent was obtained from the patient and/or patient's representative following explanation of the procedure, risks, benefits and alternatives. All questions were addressed. A time out was performed prior to the initiation of the procedure. Maximal barrier sterile technique utilized including caps, mask, sterile gowns, sterile gloves, large sterile drape, hand hygiene, and Betadine prep. The RIGHT femoral head was marked fluoroscopically. Under sterile conditions and local anesthesia, the RIGHT common femoral artery  access was performed with a micropuncture needle. Under direct ultrasound guidance, the RIGHT common femoral was accessed with a micropuncture kit. An ultrasound image was saved for documentation purposes. This allowed for placement of a 5 Fr vascular sheath. A limited arteriogram was performed through the side arm of the sheath confirming appropriate access within the RIGHT common femoral artery. Over a Bentson wire, a Mickelson catheter was advanced, reformed, back bled and flushed. The catheter was then utilized to select the inferior mesenteric artery and a selective inferior mesenteric arteriogram was performed. Using a 2.1 Fr Progreat alpha microcatheter and microwire, access into the distal LEFT colic artery branch vessels was performed and selective arteriography was obtained to identify the bleeding vessel. Super selective coil embolization was then performed using a single non detachable micro coil. A post embolization arteriogram was performed without evidence of residual extravasation. Images were reviewed and the procedure was terminated. All wires,  catheters and sheaths were removed from the patient. Hemostasis was achieved at the RIGHT groin access site with Angio-Seal closure device. The patient tolerated the procedure well without immediate post procedural complication. FINDINGS: - access via the RIGHT femoral artery. - No overt extravasation at the descending colon with suspected ooze from IMA territory, distal LEFT colic branch vessel - Successful embolization of the target vessel with a single micro coil - Angio-Seal closure at RIGHT groin. Palpable RLE pulses at the end of the Case IMPRESSION: Successful super-selective coil embolization of a target vessel at site of ooze from IMA territory, LEFT colic branch vessel, as above. PLAN: - The patient is to remain flat for 2 hours with RIGHT leg straight. - The patient may continue to experience several additional bloody bowel movements however should clear the rectal vault in the coming days. Thom Hall, MD [facility] Radiology Electronically Signed   By: Thom Hall M.D.   On: 12/28/2023 11:15   CT ANGIO GI BLEED Result Date: 12/27/2023 CLINICAL DATA:  Rectal bleeding.  Left upper abdominal pain. EXAM: CTA ABDOMEN AND PELVIS WITHOUT AND WITH CONTRAST TECHNIQUE: Multidetector CT imaging of the abdomen and pelvis was performed using the standard protocol during bolus administration of intravenous contrast. Multiplanar reconstructed images and MIPs were obtained and reviewed to evaluate the vascular anatomy. RADIATION DOSE REDUCTION: This exam was performed according to the departmental dose-optimization program which includes automated exposure control, adjustment of the mA and/or kV according to patient size and/or use of iterative reconstruction technique. CONTRAST:  OMNIPAQUE  IOHEXOL  350 MG/ML SOLN COMPARISON:  None Available. FINDINGS: VASCULAR Aorta: Atherosclerotic disease in the abdominal aorta without aneurysm or dissection or significant  stenosis. Celiac: Patent without evidence of aneurysm, dissection, vasculitis or significant stenosis. SMA: Patent without evidence of aneurysm, dissection, vasculitis or significant stenosis. Renals: Single bilateral renal arteries. Left renal artery is widely patent without aneurysm, dissection or stenosis. At least mild stenosis at origin of the right renal artery without aneurysm or dissection. IMA: Patent without evidence of aneurysm, dissection, vasculitis or significant stenosis. 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 Inflow: Patent without evidence of aneurysm, dissection, vasculitis or significant stenosis. Proximal Outflow: Proximal femoral arteries are patent bilaterally. Veins: Portal venous system is patent. Normal appearance of the IVC and renal veins. Normal appearance of the iliac veins. Review of the MIP images confirms the above findings. NON-VASCULAR Lower chest: Peripheral reticular densities at the lung bases are suggestive for chronic changes. No pleural effusions. Hepatobiliary:  Gallbladder is mildly distended with high-density sludge or small stones. No gallbladder inflammation. Main portal venous system is patent. No biliary dilatation. Pancreas: Unremarkable. No pancreatic ductal dilatation or surrounding inflammatory changes. Spleen: Normal in size without focal abnormality. Adrenals/Urinary Tract: Normal adrenal glands. No kidney stones. Cyst in the left kidney upper pole. No hydronephrosis. No suspicious renal lesion. Normal urinary bladder. Stomach/Bowel: Positive for active GI bleeding involving the descending colon. 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 The area of arterial bleeding in the colon is distal to the previously embolized area. Extensive diverticular disease throughout the colon. Endoscopic clip in the right colon near the ileocecal valve.  Normal appendix. Normal stomach. No bowel dilatation or obstruction. Lymphatic: No lymph node enlargement in the abdomen or pelvis. Reproductive: Uterus and bilateral adnexa are unremarkable. Other: Negative for ascites.  Negative for free air. Musculoskeletal: No acute bone abnormality. Old vertebral body compression fracture at L1. IMPRESSION: 1. Positive for active GI bleeding involving the descending colon. Findings are most compatible with a diverticular bleed. 2. 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 3. Extensive diverticular disease throughout the colon. 4. Gallbladder sludge or small stones. No gallbladder inflammation. 5. Aortic Atherosclerosis (ICD10-I70.0). These results were called by telephone at the time of interpretation on 12/27/2023 at 5:12 pm to provider Dr. Efrain, who verbally acknowledged these results. Electronically Signed   By: Juliene Balder M.D.   On: 12/27/2023 20:33   IMPRESSION: Painless hematochezia, diverticular bleed  -S/p IR embolization 12/27/23             -Video capsule endoscopy 11/18/23 with findings of blood in cecum/colon based on my review Personal history colon polyps 09/2023 (20 mm ascending removed piecemeal via hot snare) Personal history unspecified colitis on colonoscopy 09/2023   Normocytic, blood loss anemia, stable  PLAN: -Monitor bowel movements, likely will have some old blood per rectum -Trend H/H, transfuse for Hgb < 7  -Ok for diet from GI perspective -[facility] will follow   LOS: 0 days   Estefana Keas, [facility] Gastroenterology

## 2023-12-28 NOTE — Plan of Care (Signed)
  Problem: Clinical Measurements: Goal: Ability to maintain clinical measurements within normal limits will improve Outcome: Progressing Goal: Diagnostic test results will improve Outcome: Not Progressing Goal: Respiratory complications will improve Outcome: Progressing Goal: Cardiovascular complication will be avoided Outcome: Progressing   Problem: Activity: Goal: Risk for activity intolerance will decrease Outcome: Not Progressing   Problem: Nutrition: Goal: Adequate nutrition will be maintained Outcome: Not Progressing   Problem: Coping: Goal: Level of anxiety will decrease Outcome: Not Progressing   Problem: Elimination: Goal: Will not experience complications related to bowel motility Outcome: Not Progressing Goal: Will not experience complications related to urinary retention Outcome: Progressing

## 2023-12-28 NOTE — Care Management Obs Status (Signed)
 MEDICARE OBSERVATION STATUS NOTIFICATION   Patient Details  Name: DANUTA HUSEMAN MRN: [id number] Date of Birth: 1941-06-18   Medicare Observation Status Notification Given:  Yes    Sonda Manuella Quill, RN 12/28/2023, 9:42 AM

## 2023-12-28 NOTE — Hospital Course (Addendum)
    Acute lower gastrointestinal bleed:  1 day history of new onset hematochezia with 8-9 total episodes.  CTA showed active extravasation in the descending colon.  Patient was taken by IR for embolization.  Currently has not had further bowel movements.  Received 1 unit of packed RBC.  Will continue to monitor closely      Acute blood loss anemia superimposed on chronic iron  deficiency anemia:  baseline hemoglobin 8-10, her hemoglobin trend in the ED has been trending down, now 6.4, in the setting of presenting acute lower gastrointestinal bleed.  Received 1 unit of packed RBC with improvement in hemoglobin.  No further blood loss reported at this time   Mild hypokalemia.  Will replac with IV KCl.  e.  Check levels in AM.    COPD: On Anoro Ellipta .  Appears compensated.  Continue nebulizers while in hospital

## 2023-12-28 NOTE — Progress Notes (Signed)
 eLink Physician-Brief Progress Note Patient Name: [name] DOB: Aug 16, 1940 MRN: [id number]   Date of Service  12/28/2023  HPI/Events of Note  83 y/o female with h/o diverticular bleed s/p embolization being followed by [name] who presented with GI bleeding.  BRBPR noted.  She was transported from her assisted living to ED.  Her Hbg dropped from 10.2 to 8.3.   CT abd showing: IMPRESSION: 1. Positive for active GI bleeding involving the descending colon. Findings are most compatible with a diverticular bleed. She underwent angiography and embolization by IR.   Not on pressors.  Most recent hgb 8.2  eICU Interventions  Chart reviewed     Intervention Category Evaluation Type: New Patient Evaluation  [name], P 12/28/2023, 6:03 AM

## 2023-12-28 NOTE — Plan of Care (Signed)
  Problem: Clinical Measurements: Goal: Ability to maintain clinical measurements within normal limits will improve Outcome: Progressing Goal: Diagnostic test results will improve Outcome: Progressing Goal: Respiratory complications will improve Outcome: Progressing Goal: Cardiovascular complication will be avoided Outcome: Progressing   Problem: Activity: Goal: Risk for activity intolerance will decrease Outcome: Progressing   Problem: Nutrition: Goal: Adequate nutrition will be maintained Outcome: Progressing   Problem: Elimination: Goal: Will not experience complications related to urinary retention Outcome: Progressing   Problem: Pain Managment: Goal: General experience of comfort will improve and/or be controlled Outcome: Progressing   Problem: Coping: Goal: Level of anxiety will decrease Outcome: Not Progressing

## 2023-12-28 NOTE — TOC Initial Note (Addendum)
 Transition of Care [facility]) - Initial/Assessment Note    Patient Details  Name: [name] MRN: [id number] Date of Birth: 06/05/1941  [facility]) CM/SW Contact:    Sonda Manuella Quill, RN Phone Number: 12/28/2023, 9:49 AM  Clinical Narrative:                 Per Delon, RN pt is confused; LVM for her husband [name] Radford ([phone]); called pt's son Dr [name] Sharper Wentz [phone]); he says pt resides at [facility]; he plans for her to return at d/c; transportation TBD; her verified insurance/ PCP; Dr Lighter denied pt experiencing SDOH risks; she has walker, and shower chair; he says pt is not receiving HH services; pt's son says she has home oxygen  for night use; he does not know the name of providing agency;  I, Sonda Manuella Quill, RN, verbally reviewed observation notice with Dr [name] Sharper Lighter (son) telephonically at [phone]; copy of MOON left on bedside table in room; pt's son notified and verbalized understanding; called facility and spoke w/ receptionist EJ; she verified pt's residency and level of care; TOC is following.  Expected Discharge Plan: Assisted Living Barriers to Discharge: Continued Medical Work up   Patient Goals and CMS Choice Patient states their goals for this hospitalization and ongoing recovery are:: per her son Halaina Vanduzer, pt will return to [facility] CMS Medicare.gov Compare Post Acute Care list provided to:: Patient Represenative (must comment) Sydnee EMERSON Lighter (son))   [place] ownership interest in [facility].provided to:: Adult Children    Expected Discharge Plan and Services   Discharge Planning Services: CM Consult                                          Prior Living Arrangements/Services   Lives with:: Facility Resident Patient language and need for interpreter reviewed:: Yes Do you feel safe going back to the place where you live?: Yes      Need for Family  Participation in Patient Care: Yes (Comment) Care giver support system in place?: Yes (comment) Current home services: DME (walker, shower chair) Criminal Activity/Legal Involvement Pertinent to Current Situation/Hospitalization: No - Comment as needed  Activities of Daily Living   ADL Screening (condition at time of admission) Independently performs ADLs?: No Does the patient have a NEW difficulty with bathing/dressing/toileting/self-feeding that is expected to last >3 days?: No Does the patient have a NEW difficulty with getting in/out of bed, walking, or climbing stairs that is expected to last >3 days?: No Does the patient have a NEW difficulty with communication that is expected to last >3 days?: No Is the patient deaf or have difficulty hearing?: No Does the patient have difficulty seeing, even when wearing glasses/contacts?: Yes Does the patient have difficulty concentrating, remembering, or making decisions?: Yes  Permission Sought/Granted Permission sought to share information with : Case Manager Permission granted to share information with : Yes, Verbal Permission Granted  Share Information with NAME: Case Manager     Permission granted to share info w Relationship: Deatra Mcmahen (son) [phone]     Emotional Assessment Appearance:: Other (Comment Required (unable to assess) Attitude/Demeanor/Rapport: Unable to Assess Affect (typically observed): Unable to Assess Orientation: :  (unable to assess) Alcohol  / Substance Use: Not Applicable Psych Involvement: No (comment)  Admission diagnosis:  Acute lower GI bleeding [K92.2]  Lower GI bleed [K92.2] Patient Active Problem List   Diagnosis Date Noted   Hypokalemia 12/28/2023   Acute on chronic blood loss anemia 12/27/2023   Acute lower GI bleeding 12/27/2023   Closed compression fracture of body of L1 vertebra (HCC) 12/09/2023   Urinary incontinence 12/05/2023   Acute GI bleeding 11/18/2023   Ischemic colitis  (HCC) 10/27/2023   Acute upper GI bleed 10/25/2023   Edema of right lower extremity 10/25/2023   Chronic heart failure with preserved ejection fraction (HFpEF) (HCC) 10/25/2023   UGI bleed 04/19/2023   Recurrent depression (HCC) 04/13/2023   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   History of GI diverticular bleed 03/28/2023   GAD (generalized anxiety disorder) 03/28/2023   Diastolic heart failure (HCC) 03/19/2023   Class 1 obesity 03/19/2023   Thoracic aorta atherosclerosis (HCC) 03/19/2023   Melena 01/05/2023   Hypotension 05/02/2022   UTI (urinary tract infection) 02/05/2022   Restless leg syndrome 02/05/2022   Senile osteoporosis 02/05/2022   Hypertension 02/05/2022   Pulmonary nodule 1 cm or greater in diameter 02/05/2022   GERD (gastroesophageal reflux disease) 02/05/2022   Upper GI bleed 01/07/2022   Iron  deficiency anemia 05/02/2021   Chronic anemia    DOE (dyspnea on exertion) 11/18/2017   COPD (chronic obstructive pulmonary disease) (HCC) 11/18/2017   Diverticulosis of intestine with bleeding 09/05/2017   Acute respiratory failure with hypoxia (HCC) 08/07/2017   Depression with anxiety 08/07/2017   Hyperlipidemia 08/07/2017   Macular degeneration 08/07/2017   Glaucoma 08/07/2017   Weakness 08/07/2017   Dehydration 08/07/2017   Lower GI bleeding 10/14/2016   Diverticulosis of colon 10/14/2016   Anxiety 10/14/2016   Lower GI bleed 10/14/2016   GI bleed    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   Dermatochalasis of both upper eyelids 08/17/2014   Osteoarthritis, multiple sites 12/20/2011   PCP:  Gil Greig BRAVO, NP Pharmacy:   [facility] [phone] [place], [place] - [address] AT [facility] [address] & [facility] [address] [place] [address] Phone: [phone] Fax: [phone]  [facility] - [place], [place] - [address] [place] [address] Phone:  [phone] Fax: [phone]  [facility] - [place], [place] - [place] [address] [place] [address] Phone: [phone] Fax: [phone]     Social Drivers of Health (SDOH) Social History: SDOH Screenings   Food Insecurity: No Food Insecurity (12/28/2023)  Housing: Low Risk  (12/28/2023)  Transportation Needs: No Transportation Needs (12/28/2023)  Utilities: Not At Risk (12/28/2023)  Alcohol  Screen: Low Risk  (04/04/2023)  Depression (PHQ2-9): Low Risk  (10/24/2023)  Financial Resource Strain: Low Risk  (04/04/2023)  Physical Activity: Insufficiently Active (04/04/2023)  Social Connections: Socially Integrated (12/28/2023)  Stress: No Stress Concern Present (04/04/2023)  Tobacco Use: Medium Risk (12/28/2023)  Health Literacy: Adequate Health Literacy (04/04/2023)   SDOH Interventions: Food Insecurity Interventions: Intervention Not Indicated, Inpatient TOC Housing Interventions: Intervention Not Indicated, Inpatient TOC Transportation Interventions: Intervention Not Indicated, Inpatient TOC Utilities Interventions: Intervention Not Indicated, Inpatient TOC   Readmission Risk Interventions    11/19/2023   11:31 AM 04/29/2023   11:10 AM 04/21/2023    2:05 PM  Readmission Risk Prevention Plan  Transportation Screening Complete Complete Complete  PCP or Specialist Appt within 5-7 Days Complete    PCP or Specialist Appt within 3-5 Days  Complete Complete  Home Care Screening Complete    Medication  Review (RN CM) Complete    HRI or Home Care Consult  Complete Complete  Social Work Consult for Recovery Care Planning/Counseling  Complete Complete  Palliative Care Screening  Not Applicable Not Applicable  Medication Review Oceanographer)  Complete Complete

## 2023-12-28 NOTE — Progress Notes (Signed)
 TRH Update:  IR completed embolization.  Following transfusion of 1 unit PRBC, hemoglobin is improved from 6.4-8.2.  Will hold off on transfusion additional unit at this time, and continue to monitor his healing H&H trend.    Eva Pore, DO Hospitalist

## 2023-12-28 NOTE — Consult Note (Addendum)
 Vascular and Interventional Radiology  PRE PROCEDURE Note  Assessment  Plan:   Ms. [name] is a 83 y.o. year old female who will undergo MESENTERIC ARTERIOGRAM, POSSIBLE EMBOLIZATION in Interventional Radiology.  The procedure has been fully reviewed with the patient/patient's authorized representative. The risks, benefits and alternatives have been explained, and the patient/patient's authorized representative has consented to the procedure. -- The patient will accept blood products in an emergent situation. -- The patient has a Do Not Resuscitate order in effect, which will be CONTINUED during the procedure.  HPI: Ms. [name] is a 83 y.o. year old female comorbid w PMHx significant for prior diverticular bleed s/p embolization 12/19/20. Pt presented with BRBPR and investigation w CTA revealing active extravasation at descending colon. Pt failed conservative management, with softening BPs and additional hematochezia through the course of the day. VIR consulted for potential embolization.  Informed consent was obtained, witnessed and placed in the patient's chart.  Allergies:  Allergies  Allergen Reactions   Morphine And Codeine Itching   Aspirin Other (See Comments)    Unknown reaction   Duloxetine  Hcl Other (See Comments)    Unknown reaction   Tymlos [Abaloparatide] Other (See Comments)    Unknown reaction   Ventolin  [Albuterol ] Other (See Comments)    rapid heart beat   Cefadroxil Hives    Patient can take amoxicillin  and cipro     Medications:  No current facility-administered medications on file prior to encounter.   Current Outpatient Medications on File Prior to Encounter  Medication Sig Dispense Refill   acetaminophen  (TYLENOL ) 650 MG CR tablet Take 1,300 mg by mouth in the morning and at bedtime.     albuterol  (VENTOLIN  HFA) 108 (90 Base) MCG/ACT inhaler Inhale 2 puffs into the lungs every 4 (four) hours as needed for wheezing or shortness of breath.      alendronate (FOSAMAX) 70 MG tablet Take 70 mg by mouth every Sunday. Take with a full glass of water on an empty stomach.     Ascorbic Acid  (VITAMIN C ) 1000 MG tablet Take 1,000 mg by mouth every morning.     cetirizine  (ZYRTEC ) 5 MG chewable tablet Chew 5 mg by mouth daily as needed for allergies.     Cholecalciferol  (VITAMIN D3) 50 MCG (2000 UT) TABS Take 2,000 Units by mouth every morning.     cyanocobalamin  (VITAMIN B12) 1000 MCG tablet Take 1,000 mcg by mouth daily.     Dorzolamide  HCl-Timolol  Mal PF 2-0.5 % SOLN Place 1 drop into both eyes in the morning and at bedtime.     ferrous gluconate  (FERGON) 240 (27 FE) MG tablet Take 240 mg by mouth every morning. Take without food     latanoprost  (XALATAN ) 0.005 % ophthalmic solution Place 1 drop into both eyes at bedtime.     Multiple Vitamins-Minerals (PRESERVISION AREDS 2) CAPS Take 1 capsule by mouth 2 (two) times daily.     Netarsudil  Dimesylate (RHOPRESSA ) 0.02 % SOLN Place 1 drop into both eyes at bedtime.     oxybutynin  (DITROPAN -XL) 10 MG 24 hr tablet Take 10 mg by mouth at bedtime.     pantoprazole  (PROTONIX ) 40 MG tablet Take 1 tablet (40 mg total) by mouth 2 (two) times daily before a meal.     potassium chloride  20 MEQ/15ML (10%) SOLN Take 15 mLs by mouth daily.     Probiotic Product (ALIGN) 4 MG CAPS Take 4 mg by mouth at bedtime.     rOPINIRole  (REQUIP ) 0.5  MG tablet Take 1 tablet (0.5 mg total) by mouth daily as needed. 30 tablet 0   senna (SENOKOT) 8.6 MG TABS tablet Take 2 tablets by mouth at bedtime.     sertraline  (ZOLOFT ) 50 MG tablet Take 1.5 tablets (75 mg total) by mouth daily. 7 tablet 0   simvastatin  (ZOCOR ) 20 MG tablet Take 20 mg by mouth every evening.     Sodium Fluoride  (PREVIDENT 5000 BOOSTER PLUS) 1.1 % PSTE Place 1 Application onto teeth See admin instructions. Brush on teeth with a toothbrush after evening mouth care. Spit out excess and do not rinse.     torsemide  (DEMADEX ) 20 MG tablet Take 20 mg by mouth  daily.     traMADol  HCl 25 MG TABS Take 25 mg by mouth 2 (two) times daily. 60 tablet 0   umeclidinium-vilanterol (ANORO ELLIPTA ) 62.5-25 MCG/ACT AEPB Inhale 1 puff into the lungs every morning.     docusate sodium  (COLACE) 100 MG capsule Take 200 mg by mouth at bedtime. (Patient not taking: No sig reported)     potassium chloride  SA (KLOR-CON  M) 20 MEQ tablet Take 1 tablet (20 mEq total) by mouth daily. (Patient not taking: Reported on 12/27/2023)      PSH:  Past Surgical History:  Procedure Laterality Date   BALLOON DILATION N/A 05/08/2021   Procedure: BALLOON DILATION;  Surgeon: Saintclair Jasper, MD;  Location: [facility] ENDOSCOPY;  Service: Gastroenterology;  Laterality: N/A;   BASAL CELL CARCINOMA EXCISION  2001   off my nose, left side   BIOPSY  05/08/2021   Procedure: BIOPSY;  Surgeon: Saintclair Jasper, MD;  Location: [facility] ENDOSCOPY;  Service: Gastroenterology;;   BIOPSY  04/25/2023   Procedure: BIOPSY;  Surgeon: Dianna Specking, MD;  Location: [facility] ENDOSCOPY;  Service: Gastroenterology;;   BLEPHAROPLASTY Bilateral    BONE BIOPSY  10/26/2023   Procedure: BIOPSY, GI;  Surgeon: Elicia Claw, MD;  Location: [facility] ENDOSCOPY;  Service: Gastroenterology;;   CATARACT EXTRACTION W/ INTRAOCULAR LENS  IMPLANT, BILATERAL Bilateral 1990's   COLONOSCOPY N/A 10/26/2023   Procedure: COLONOSCOPY;  Surgeon: Elicia Claw, MD;  Location: [facility] ENDOSCOPY;  Service: Gastroenterology;  Laterality: N/A;   COLONOSCOPY WITH PROPOFOL  N/A 05/04/2022   Procedure: COLONOSCOPY WITH PROPOFOL ;  Surgeon: Saintclair Jasper, MD;  Location: [facility] ENDOSCOPY;  Service: Gastroenterology;  Laterality: N/A;   COLONOSCOPY WITH PROPOFOL  N/A 04/25/2023   Procedure: COLONOSCOPY WITH PROPOFOL ;  Surgeon: Dianna Specking, MD;  Location: [facility] ENDOSCOPY;  Service: Gastroenterology;  Laterality: N/A;   ENTEROSCOPY N/A 11/20/2023   Procedure: ENTEROSCOPY;  Surgeon: Dianna Specking, MD;  Location: [facility] ENDOSCOPY;  Service: Gastroenterology;  Laterality: N/A;    ESOPHAGOGASTRODUODENOSCOPY N/A 10/26/2023   Procedure: EGD (ESOPHAGOGASTRODUODENOSCOPY);  Surgeon: Elicia Claw, MD;  Location: [facility] ENDOSCOPY;  Service: Gastroenterology;  Laterality: N/A;   ESOPHAGOGASTRODUODENOSCOPY N/A 11/20/2023   Procedure: EGD (ESOPHAGOGASTRODUODENOSCOPY);  Surgeon: Dianna Specking, MD;  Location: [facility] ENDOSCOPY;  Service: Gastroenterology;  Laterality: N/A;   ESOPHAGOGASTRODUODENOSCOPY (EGD) WITH PROPOFOL  N/A 05/08/2021   Procedure: ESOPHAGOGASTRODUODENOSCOPY (EGD) WITH PROPOFOL ;  Surgeon: Saintclair Jasper, MD;  Location: [facility] ENDOSCOPY;  Service: Gastroenterology;  Laterality: N/A;   ESOPHAGOGASTRODUODENOSCOPY (EGD) WITH PROPOFOL  N/A 01/11/2022   Procedure: ESOPHAGOGASTRODUODENOSCOPY (EGD) WITH PROPOFOL ;  Surgeon: Rosalie Kitchens, MD;  Location: [facility] ENDOSCOPY;  Service: Gastroenterology;  Laterality: N/A;   ESOPHAGOGASTRODUODENOSCOPY (EGD) WITH PROPOFOL  N/A 01/06/2023   Procedure: ESOPHAGOGASTRODUODENOSCOPY (EGD) WITH PROPOFOL ;  Surgeon: Rosalie Kitchens, MD;  Location: [facility] ENDOSCOPY;  Service: Gastroenterology;  Laterality: N/A;   ESOPHAGOGASTRODUODENOSCOPY (EGD) WITH PROPOFOL  N/A 04/21/2023   Procedure:  ESOPHAGOGASTRODUODENOSCOPY (EGD) WITH PROPOFOL ;  Surgeon: Dianna Specking, MD;  Location: [facility] ENDOSCOPY;  Service: Gastroenterology;  Laterality: N/A;   EYE SURGERY Bilateral    to improve vision after cataract OR   GIVENS CAPSULE STUDY N/A 04/21/2023   Procedure: GIVENS CAPSULE STUDY;  Surgeon: Dianna Specking, MD;  Location: [facility] ENDOSCOPY;  Service: Gastroenterology;  Laterality: N/A;   GIVENS CAPSULE STUDY N/A 11/18/2023   Procedure: IMAGING PROCEDURE, GI TRACT, INTRALUMINAL, VIA CAPSULE;  Surgeon: Dianna Specking, MD;  Location: [facility] ENDOSCOPY;  Service: Gastroenterology;  Laterality: N/A;   HEMOSTASIS CLIP PLACEMENT  10/26/2023   Procedure: CONTROL OF HEMORRHAGE, GI TRACT, ENDOSCOPIC, BY CLIPPING OR OVERSEWING;  Surgeon: Elicia Claw, MD;  Location: [facility] ENDOSCOPY;  Service:  Gastroenterology;;   IR ANGIOGRAM FOLLOW UP STUDY  12/19/2020   IR ANGIOGRAM SELECTIVE EACH ADDITIONAL VESSEL  12/19/2020   IR ANGIOGRAM SELECTIVE EACH ADDITIONAL VESSEL  12/19/2020   IR ANGIOGRAM VISCERAL SELECTIVE  12/19/2020   IR EMBO ART  VEN HEMORR LYMPH EXTRAV  INC GUIDE ROADMAPPING  12/19/2020   IR US  GUIDE VASC ACCESS RIGHT  12/19/2020   JOINT REPLACEMENT     POLYPECTOMY  05/04/2022   Procedure: POLYPECTOMY;  Surgeon: Saintclair Jasper, MD;  Location: [facility] ENDOSCOPY;  Service: Gastroenterology;;   POLYPECTOMY  10/26/2023   Procedure: POLYPECTOMY, INTESTINE;  Surgeon: Elicia Claw, MD;  Location: [facility] ENDOSCOPY;  Service: Gastroenterology;;   STAPEDES SURGERY Left    scraped stapedes because it was sticking when it wasn't suppose to   TONSILLECTOMY AND ADENOIDECTOMY  1946   TOTAL KNEE ARTHROPLASTY Left ~ 2008   TOTAL KNEE ARTHROPLASTY  12/20/2011   Procedure: TOTAL KNEE ARTHROPLASTY;  Surgeon: Elspeth JONELLE Her, MD;  [facility] OR;  Service: Orthopedics;  Laterality: Right;  Right Total Knee Arthroplasty   TUBAL LIGATION  1980's    PMH:  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, left side   Sleeping excessive    Tinnitus of both ears     Brief Physical Examination: Vitals:   12/28/23 0008 12/28/23 0025  BP: (!) 112/57 (!) 125/56  Pulse: (!) 120 (!) 118  Resp: (!) 22 (!) 27  Temp:  98.3 F (36.8 C)  SpO2:  94%   General: WD, WN elderly female in  NAD HEENT: Normocephalic, atraumatic Lungs: Respirations non-labored   Sedation / Anesthesia: Moderate Sedation Airway assessment: normal Mallampati: III (soft and hard palate and base of uvula visible) ASA scoring: ASA 3 - Patient with moderate systemic disease with functional limitations   Thom Hall, MD Vascular and Interventional Radiology Specialists [facility] Radiology   Pager. [phone] Clinic. [phone]

## 2023-12-28 NOTE — Procedures (Signed)
 Vascular and Interventional Radiology Procedure Note  Patient: [name] DOB: [birth year] Medical Record Number: [id number] Note Date/Time: 12/28/23 1:04 AM   Performing Physician: Thom Hall, MD Assistant(s): None  Diagnosis: BRBPR  Procedure:  MESENTERIC ARTERIOGRAPHY COIL EMBOLIZATION of an INFERIOR MESENTERIC BRANCH ARTERY  Anesthesia: Conscious Sedation Complications: None Estimated Blood Loss: Minimal Specimens: None  Findings:  - access via the RIGHT femoral artery. - mildly stenotic IMA origin. No overt extravasation at descending colon, and suspected ooze from a branch IMA vessel. - successful coil embolization of target vessel - AngioSeal closure at the R groin. Palpable RLE pulses at the end of the Case  Final report to follow once all images are reviewed and compared with previous studies.  See detailed dictation with images in PACS. The patient tolerated the procedure well without incident or complication and was returned to Recovery in stable condition.    Thom Hall, MD Vascular and Interventional Radiology Specialists [facility] Radiology   Pager. [phone] Clinic. [phone]

## 2023-12-28 NOTE — Consult Note (Signed)
 NAME:  [name], MRN:  [id number], DOB:  05-07-1941, LOS: 0 ADMISSION DATE:  12/27/2023, CONSULTATION DATE:  12/27/2023 REFERRING MD:  Lum Passe, MD, CHIEF COMPLAINT:  Lower GI Bleed  History of Present Illness:  83 y/o female with PMH for CHF, HTN, HLD, COPD not on O2, h/o diverticular bleed s/p embolization being followed by [name] who presented with GI bleeding.  She says around 4pm today she had a BM which was bloody with bright red blood.  She had other Bms and was transported from her assisted living to ED.  Her Hbg noted to drop from 10.2 to 8.3, HR 120sto 140s and sinus, K+ 3.1, CT abd showing: IMPRESSION: 1. Positive for active GI bleeding involving the descending colon. Findings are most compatible with a diverticular bleed. 2. 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 3. Extensive diverticular disease throughout the colon. 4. Gallbladder sludge or small stones. No gallbladder inflammation. 5. Aortic Atherosclerosis (ICD10-I70.0).  Pertinent  Medical History  Anxiety, Arthritis, BPPV (benign paroxysmal positional vertigo), Depression, GERD (gastroesophageal reflux disease), Glaucoma, both eyes, Headache, History of blood transfusion (~ 2008), Hyperlipemia, Lower GI bleeding (~ 2008; 09/05/2017), Macular degeneration, bilateral, Osteopenia, Seasonal allergies, Skin cancer, basal cell (2001), Sleeping excessive, and Tinnitus of both ears.  Significant Hospital Events: Including procedures, antibiotic start and stop dates in addition to other pertinent events   6/28: Admit to Stepdown  Interim History / Subjective:    12/28/23: Underwent mesenteric arteriography with coil embolization of an.  Ainferior mesenteric branch artery this was successful.  Good was noted the patient received 1 unit of packed red blood cells and hemoglobin went from 6.4 g% to 8.2 g%.  Transfusion was held off this morning.  Currently not on pressors.   Appears to be on room air oxygen .  Not on the ventilator.  Getting fluids.  Afebrile normal white count.  CODE STATUS is DNR.  Objective    Blood pressure (!) 125/38, pulse (!) 109, temperature 98.1 F (36.7 C), temperature source Axillary, resp. rate (!) 22, height 5' 1 (1.549 m), weight 76.7 kg, SpO2 95%.        Intake/Output Summary (Last 24 hours) at 12/28/2023 0951 Last data filed at 12/28/2023 9070 Gross per 24 hour  Intake 3420.08 ml  Output 650 ml  Net 2770.08 ml   Filed Weights   12/27/23 0947 12/28/23 0257  Weight: 75 kg 76.7 kg    Examination: General: alert awake NAD HENT: PERRLA no icterus EOMI Lungs: CTA no wheezes no rales Cardiovascular: reg tachy s1s2 no murmurs Abdomen: soft nt nd bs pos Extremities: no edema clubbing or cyanosis Neuro: AAO x 3, CN II to XII grossly intact   General Appearance:  Looks chronically unwell. Lying in bed. Sleeping Head:  Normocephalic, without obvious abnormality, atraumatic Eyes:  PERRL - yes, conjunctiva/corneas - muddy     Ears:  Normal external ear canals, both ears Nose:  G tube - no Throat:  ETT TUBE - nono , OG tube - no Neck:  Supple,  No enlargement/tenderness/nodules Lungs: Clear to auscultation bilaterally,  Heart:  S1 and S2 normal, no murmur, CVP - no.  Pressors - no Abdomen:  Soft, no masses, no organomegaly Genitalia / Rectal:  Not done Extremities:  Extremities- intac but has chronic venous edema Skin:  ntact in exposed areas .  Neurologic:  Sedation - none -> RASS - sleeping . Moves all 4s - yes. CAM-ICU - not  tested .  Orientation - not tested  '  Resolved problem list   Assessment and Plan  Acute Lower Bleed Diverticular bleeding ABLA Sinus tachycardia Hypovolemia   6/30 - bleeding seems resolved after successful coil embolization. Not on pressors. Not on o2  Plan  - ccm will sign off - Mgmt by [facility] - can move to floor for futher monitoring  Low Mag < 2 Low K 6/29  Plan  -  replete   Best Practice (right click and Reselect all SmartList Selections daily)   Per [facility]   SIGNATURE    Dr. Dorethia Cave, M.D., F.C.C.P,  Pulmonary and Critical Care Medicine Staff Physician, [facility] Director - Interstitial Lung Disease  Program  Pulmonary Fibrosis [facility] [place], [place], [address]   Pager: [phone], If no answer  -> Check AMION or Try [phone] Telephone (clinical office): [phone] Telephone (research): [phone]  10:02 AM 12/28/2023   LABS    PULMONARY Recent Labs  Lab 12/27/23 1139  TCO2 26    CBC Recent Labs  Lab 12/27/23 1130 12/27/23 1139 12/27/23 2317 12/28/23 0411 12/28/23 0843  HGB 9.4*   < > 6.4* 8.2* 8.1*  HCT 31.2*   < > 21.2* 26.7* 26.1*  WBC 5.8  --   --  8.5  --   PLT 170  --   --  121*  --    < > = values in this interval not displayed.    COAGULATION Recent Labs  Lab 12/27/23 1753  INR 1.2    CARDIAC  No results for input(s): TROPONINI in the last 168 hours. No results for input(s): PROBNP in the last 168 hours.   CHEMISTRY Recent Labs  Lab 12/27/23 1130 12/27/23 1139 12/28/23 0411  NA 140 142 140  K 3.4* 3.1* 3.4*  CL 103 101 107  CO2 28  --  25  GLUCOSE 120* 120* 119*  BUN 11 9 10   CREATININE 0.52 0.60 0.45  CALCIUM  8.4*  --  7.7*  MG  --   --  1.9   Estimated Creatinine Clearance: 48.2 mL/min (by C-G formula based on SCr of 0.45 mg/dL).   LIVER Recent Labs  Lab 12/27/23 1130 12/27/23 1753 12/28/23 0411  AST 23  --  20  ALT 8  --  9  ALKPHOS 65  --  49  BILITOT 0.8  --  1.9*  PROT 6.1*  --  4.9*  ALBUMIN 3.5  --  2.9*  INR  --  1.2  --      INFECTIOUS No results for input(s): LATICACIDVEN, PROCALCITON in the last 168 hours.   ENDOCRINE CBG (last 3)  No results for input(s): GLUCAP in the last 72 hours.       IMAGING x48h  - image(s) personally visualized  -   highlighted in  bold CT ANGIO GI BLEED Result Date: 12/27/2023 CLINICAL DATA:  Rectal bleeding.  Left upper abdominal pain. EXAM: CTA ABDOMEN AND PELVIS WITHOUT AND WITH CONTRAST TECHNIQUE: Multidetector CT imaging of the abdomen and pelvis was performed using the standard protocol during bolus administration of intravenous contrast. Multiplanar reconstructed images and MIPs were obtained and reviewed to evaluate the vascular anatomy. RADIATION DOSE REDUCTION: This exam was performed according to the departmental dose-optimization program which includes automated exposure control, adjustment of the mA and/or kV according to patient size and/or use of iterative reconstruction technique. CONTRAST:  OMNIPAQUE  IOHEXOL  350 MG/ML  SOLN COMPARISON:  None Available. FINDINGS: VASCULAR Aorta: Atherosclerotic disease in the abdominal aorta without aneurysm or dissection or significant stenosis. Celiac: Patent without evidence of aneurysm, dissection, vasculitis or significant stenosis. SMA: Patent without evidence of aneurysm, dissection, vasculitis or significant stenosis. Renals: Single bilateral renal arteries. Left renal artery is widely patent without aneurysm, dissection or stenosis. At least mild stenosis at origin of the right renal artery without aneurysm or dissection. IMA: Patent without evidence of aneurysm, dissection, vasculitis or significant stenosis. 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 Inflow: Patent without evidence of aneurysm, dissection, vasculitis or significant stenosis. Proximal Outflow: Proximal femoral arteries are patent bilaterally. Veins: Portal venous system is patent. Normal appearance of the IVC and renal veins. Normal appearance of the iliac veins. Review of the MIP images confirms the above findings. NON-VASCULAR Lower chest: Peripheral reticular densities at the lung bases are suggestive for chronic changes. No pleural effusions.  Hepatobiliary: Gallbladder is mildly distended with high-density sludge or small stones. No gallbladder inflammation. Main portal venous system is patent. No biliary dilatation. Pancreas: Unremarkable. No pancreatic ductal dilatation or surrounding inflammatory changes. Spleen: Normal in size without focal abnormality. Adrenals/Urinary Tract: Normal adrenal glands. No kidney stones. Cyst in the left kidney upper pole. No hydronephrosis. No suspicious renal lesion. Normal urinary bladder. Stomach/Bowel: Positive for active GI bleeding involving the descending colon. 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 The area of arterial bleeding in the colon is distal to the previously embolized area. Extensive diverticular disease throughout the colon. Endoscopic clip in the right colon near the ileocecal valve. Normal appendix. Normal stomach. No bowel dilatation or obstruction. Lymphatic: No lymph node enlargement in the abdomen or pelvis. Reproductive: Uterus and bilateral adnexa are unremarkable. Other: Negative for ascites.  Negative for free air. Musculoskeletal: No acute bone abnormality. Old vertebral body compression fracture at L1. IMPRESSION: 1. Positive for active GI bleeding involving the descending colon. Findings are most compatible with a diverticular bleed. 2. 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 3. Extensive diverticular disease throughout the colon. 4. Gallbladder sludge or small stones. No gallbladder inflammation. 5. Aortic Atherosclerosis (ICD10-I70.0). These results were called by telephone at the time of interpretation on 12/27/2023 at 5:12 pm to provider Dr. Efrain, who verbally acknowledged these results. Electronically Signed   By: Juliene Balder M.D.   On: 12/27/2023 20:33

## 2023-12-29 DIAGNOSIS — K922 Gastrointestinal hemorrhage, unspecified: Secondary | ICD-10-CM | POA: Diagnosis not present

## 2023-12-29 LAB — BASIC METABOLIC PANEL WITH GFR
Anion gap: 7 (ref 5–15)
BUN: 9 mg/dL (ref 8–23)
CO2: 23 mmol/L (ref 22–32)
Calcium: 8.2 mg/dL — ABNORMAL LOW (ref 8.9–10.3)
Chloride: 109 mmol/L (ref 98–111)
Creatinine, Ser: 0.36 mg/dL — ABNORMAL LOW (ref 0.44–1.00)
GFR, Estimated: >60 mL/min (ref >60)
Glucose, Bld: 99 mg/dL (ref 70–99)
Potassium: 3.6 mmol/L (ref 3.5–5.1)
Sodium: 139 mmol/L (ref 135–145)

## 2023-12-29 LAB — CBC
HCT: 24.4 % — ABNORMAL LOW (ref 36.0–46.0)
HCT: 25.1 % — ABNORMAL LOW (ref 36.0–46.0)
HCT: 24.8 % — ABNORMAL LOW (ref 36.0–46.0)
Hemoglobin: 7.3 g/dL — ABNORMAL LOW (ref 12.0–15.0)
Hemoglobin: 7.7 g/dL — ABNORMAL LOW (ref 12.0–15.0)
Hemoglobin: 7.6 g/dL — ABNORMAL LOW (ref 12.0–15.0)
MCH: 29.6 pg (ref 26.0–34.0)
MCH: 30.2 pg (ref 26.0–34.0)
MCH: 30.3 pg (ref 26.0–34.0)
MCHC: 29.9 g/dL — ABNORMAL LOW (ref 30.0–36.0)
MCHC: 30.7 g/dL (ref 30.0–36.0)
MCHC: 30.6 g/dL (ref 30.0–36.0)
MCV: 98.8 fL (ref 80.0–100.0)
MCV: 98.4 fL (ref 80.0–100.0)
MCV: 98.8 fL (ref 80.0–100.0)
Platelets: 133 10*3/uL — ABNORMAL LOW (ref 150–400)
Platelets: 152 10*3/uL (ref 150–400)
Platelets: 145 10*3/uL — ABNORMAL LOW (ref 150–400)
RBC: 2.47 MIL/uL — ABNORMAL LOW (ref 3.87–5.11)
RBC: 2.55 MIL/uL — ABNORMAL LOW (ref 3.87–5.11)
RBC: 2.51 MIL/uL — ABNORMAL LOW (ref 3.87–5.11)
RDW: 19 % — ABNORMAL HIGH (ref 11.5–15.5)
RDW: 18.8 % — ABNORMAL HIGH (ref 11.5–15.5)
RDW: 18.6 % — ABNORMAL HIGH (ref 11.5–15.5)
WBC: 6.8 10*3/uL (ref 4.0–10.5)
WBC: 7 10*3/uL (ref 4.0–10.5)
WBC: 6.7 10*3/uL (ref 4.0–10.5)
nRBC: 0 % (ref 0.0–0.2)
nRBC: 0.3 % — ABNORMAL HIGH (ref 0.0–0.2)
nRBC: 0 % (ref 0.0–0.2)

## 2023-12-29 LAB — MAGNESIUM: Magnesium: 2.5 mg/dL — ABNORMAL HIGH (ref 1.7–2.4)

## 2023-12-29 MED ORDER — LATANOPROST 0.005 % OP SOLN
1.0000 [drp] | Freq: Every day | OPHTHALMIC | Status: DC
  Administered 2023-12-29 – 2023-12-31 (×3): 1 [drp] via OPHTHALMIC
  Filled 2023-12-29: qty 2.5

## 2023-12-29 MED ORDER — DORZOLAMIDE HCL-TIMOLOL MAL 2-0.5 % OP SOLN
1.0000 [drp] | Freq: Two times a day (BID) | OPHTHALMIC | Status: DC
  Administered 2023-12-29 – 2024-01-01 (×6): 1 [drp] via OPHTHALMIC
  Filled 2023-12-29: qty 10

## 2023-12-29 NOTE — Plan of Care (Signed)
  Problem: Clinical Measurements: Goal: Respiratory complications will improve Outcome: Progressing Goal: Cardiovascular complication will be avoided Outcome: Progressing   Problem: Coping: Goal: Level of anxiety will decrease Outcome: Progressing   Problem: Elimination: Goal: Will not experience complications related to bowel motility Outcome: Progressing Goal: Will not experience complications related to urinary retention Outcome: Progressing   

## 2023-12-29 NOTE — Evaluation (Signed)
 Physical Therapy Evaluation Patient Details Name: [name] MRN: [id number] DOB: Dec 25, 1940 Today's Date: 12/29/2023  History of Present Illness  [name] is a 83 year old female who presented to the hospital 12/27/23 with BRBPR , low hgb. Patient was admitted with LGI bleed.  EFY:jwkpzub, depression, chronic diastolic heart failure, osteoarthritis, glaucoma, hyperlipidemia, hypertension, pulmonary nodule, restless leg syndrome, osteoporosis, COPD, aortic arthrosclerosis, GERD, history of diverticular bleed with embolization, UGI bleed, chronic iron  deficiency anemia  Clinical Impression  Pt admitted with above diagnosis.  Pt currently with functional limitations due to the deficits listed below (see PT Problem List). Pt will benefit from acute skilled PT to increase their independence and safety with mobility to allow discharge.     The patient is  A/O x 4, well known to  therapy from multiple previous admissions.  Patient is ambulatory with RW at baseline, resides in ALF with spouse.   Patient  assisted to  [facility] and recliner using the RW and min assistance.  Patient's HR 120-144 with activity, RN aware.  Patient remained on RA.SABRA  Patient will benefit from continued inpatient follow up therapy, <3 hours/day       If plan is discharge home, recommend the following: A lot of help with walking and/or transfers;A lot of help with bathing/dressing/bathroom;Assistance with cooking/housework;Help with stairs or ramp for entrance   Can travel by private vehicle        Equipment Recommendations None recommended by PT  Recommendations for Other Services       Functional Status Assessment Patient has had a recent decline in their functional status and demonstrates the ability to make significant improvements in function in a reasonable and predictable amount of time.     Precautions / Restrictions Precautions Precautions: Fall Precaution/Restrictions Comments: low  vision Restrictions Weight Bearing Restrictions Per Provider Order: No      Mobility  Bed Mobility Overal bed mobility: Needs Assistance Bed Mobility: Supine to Sit     Supine to sit: Contact guard     General bed mobility comments: multimodal cues, no assistance required    Transfers Overall transfer level: Needs assistance Equipment used: Rolling walker (2 wheels) Transfers: Sit to/from Stand, Bed to chair/wheelchair/BSC Sit to Stand: Min assist   Step pivot transfers: Min assist, +2 safety/equipment       General transfer comment: stood at Rw, stepped to recliner then to and from [facility] back to recliner.    Ambulation/Gait                  Stairs            Wheelchair Mobility     Tilt Bed    Modified Rankin (Stroke Patients Only)       Balance Overall balance assessment: Needs assistance Sitting-balance support: Bilateral upper extremity supported, Feet supported Sitting balance-Leahy Scale: Fair     Standing balance support: Bilateral upper extremity supported, During functional activity, Reliant on assistive device for balance Standing balance-Leahy Scale: Fair                               Pertinent Vitals/Pain Pain Assessment Pain Assessment: Faces Faces Pain Scale: Hurts little more Pain Location: L knee Pain Intervention(s): Monitored during session    Home Living Family/patient expects to be discharged to:: Assisted living                 Home Equipment: Agricultural consultant (  2 wheels) Additional Comments: pt uses RW, regular toilet with rails    Prior Function Prior Level of Function : Needs assist             Mobility Comments: patient reports indep RW use at ALF ADLs Comments: has assist for bathing and LB self care as well as all IADL's     Extremity/Trunk Assessment   Upper Extremity Assessment Upper Extremity Assessment: Generalized weakness    Lower Extremity Assessment Lower Extremity  Assessment: Generalized weakness    Cervical / Trunk Assessment Cervical / Trunk Assessment: Kyphotic  Communication   Communication Communication: Impaired Factors Affecting Communication: Hearing impaired    Cognition Arousal: Alert Behavior During Therapy: WFL for tasks assessed/performed   PT - Cognitive impairments: No apparent impairments                         Following commands: Intact       Cueing       General Comments      Exercises     Assessment/Plan    PT Assessment Patient needs continued PT services  PT Problem List Decreased strength;Decreased mobility;Decreased safety awareness;Decreased activity tolerance;Decreased balance;Pain;Cardiopulmonary status limiting activity       PT Treatment Interventions DME instruction;Therapeutic activities;Gait training;Therapeutic exercise;Patient/family education;Functional mobility training    PT Goals (Current goals can be found in the Care Plan section)  Acute Rehab PT Goals Patient Stated Goal: to go home PT Goal Formulation: With patient Time For Goal Achievement: 01/12/24 Potential to Achieve Goals: Fair    Frequency Min 2X/week     Co-evaluation               AM-PAC PT 6 Clicks Mobility  Outcome Measure Help needed turning from your back to your side while in a flat bed without using bedrails?: A Little Help needed moving from lying on your back to sitting on the side of a flat bed without using bedrails?: A Little Help needed moving to and from a bed to a chair (including a wheelchair)?: A Little Help needed standing up from a chair using your arms (e.g., wheelchair or bedside chair)?: A Little Help needed to walk in hospital room?: Total Help needed climbing 3-5 steps with a railing? : Total 6 Click Score: 14    End of Session Equipment Utilized During Treatment: Gait belt Activity Tolerance: Patient tolerated treatment well Patient left: in chair;with call bell/phone  within reach Nurse Communication: Mobility status PT Visit Diagnosis: Unsteadiness on feet (R26.81);Difficulty in walking, not elsewhere classified (R26.2)    Time: 9054-8986 PT Time Calculation (min) (ACUTE ONLY): 28 min   Charges:   PT Evaluation $PT Eval Low Complexity: 1 Low PT Treatments $Therapeutic Activity: 8-22 mins PT General Charges $$ ACUTE PT VISIT: 1 Visit         Darice Potters PT Acute Rehabilitation Services Office [phone]   Potters Darice Norris 12/29/2023, 1:32 PM

## 2023-12-29 NOTE — Evaluation (Signed)
 Occupational Therapy Evaluation Patient Details Name: [name] MRN: [id number] DOB: 02-Oct-1940 Today's Date: 12/29/2023   History of Present Illness   [name] is a 83 year old female who presented to the hospital 12/27/23 with BRBPR , low hgb. Patient was admitted with LGI bleed.  EFY:jwkpzub, depression, chronic diastolic heart failure, osteoarthritis, glaucoma, hyperlipidemia, hypertension, pulmonary nodule, restless leg syndrome, osteoporosis, COPD, aortic arthrosclerosis, GERD, history of diverticular bleed with embolization, UGI bleed, chronic iron  deficiency anemia     Clinical Impressions PTA, patient was residing in ALF requiring support and assist for LB ADL's, bathing, IADL's and used RW for mobility. Currently, patient presents with deficits outlined below (see OT Problem List for details) most significantly generalized muscle weakness, decreased balance and activity tolerance for BADL's and functional mobility performance and safety. Husband bedside for OT evaluation and patient open to all education including pacing and breathing integration for light ADL's seated and standing with RW, frequent rests needed for HR and pacing. Patient requires continued Acute care hospital level OT services to progress safety and functional performance and allow for discharge. Patient will benefit from continued inpatient follow up therapy, <3 hours/day.        If plan is discharge home, recommend the following:   A lot of help with walking and/or transfers;A lot of help with bathing/dressing/bathroom;Assistance with cooking/housework;Direct supervision/assist for medications management;Direct supervision/assist for financial management;Assist for transportation;Help with stairs or ramp for entrance;Supervision due to cognitive status     Functional Status Assessment   Patient has had a recent decline in their functional status and demonstrates the ability to make significant  improvements in function in a reasonable and predictable amount of time.     Equipment Recommendations   None recommended by OT      Precautions/Restrictions   Precautions Precautions: Fall Precaution/Restrictions Comments: low vision Restrictions Weight Bearing Restrictions Per Provider Order: No     Mobility Bed Mobility Overal bed mobility:  (in recliner and remained)                  Transfers Overall transfer level: Needs assistance Equipment used: Rolling walker (2 wheels) Transfers: Sit to/from Stand, Bed to chair/wheelchair/BSC Sit to Stand: Min assist     Step pivot transfers: Min assist, +2 safety/equipment     General transfer comment: min cues for RW safety and hand placement, cues for pacing      Balance Overall balance assessment: Needs assistance Sitting-balance support: Bilateral upper extremity supported, Feet supported Sitting balance-Leahy Scale: Fair     Standing balance support: Bilateral upper extremity supported, During functional activity, Reliant on assistive device for balance Standing balance-Leahy Scale: Fair                             ADL either performed or assessed with clinical judgement   ADL Overall ADL's : Needs assistance/impaired Eating/Feeding: Set up;Sitting   Grooming: Wash/dry hands;Wash/dry face;Oral care;Sitting   Upper Body Bathing: Minimal assistance;Sitting   Lower Body Bathing: Maximal assistance;Sit to/from stand   Upper Body Dressing : Minimal assistance;Sitting   Lower Body Dressing: Maximal assistance;Sit to/from stand   Toilet Transfer: Minimal assistance;BSC/3in1;Rolling walker (2 wheels)   Toileting- Clothing Manipulation and Hygiene: Moderate assistance;Sitting/lateral lean       Functional mobility during ADLs: Minimal assistance;Rolling walker (2 wheels) General ADL Comments: min cues for safety, breathing and pacing     Vision Ability to See in Adequate Light:  1  Impaired Patient Visual Report: Blurring of vision              Pertinent Vitals/Pain Pain Assessment Pain Assessment: Faces Faces Pain Scale: Hurts a little bit Breathing: normal Negative Vocalization: none Facial Expression: smiling or inexpressive Body Language: relaxed Consolability: no need to console PAINAD Score: 0 Pain Location: L knee Pain Descriptors / Indicators: Aching, Sore Pain Intervention(s): Monitored during session, Repositioned     Extremity/Trunk Assessment Upper Extremity Assessment Upper Extremity Assessment: Generalized weakness;Right hand dominant   Lower Extremity Assessment Lower Extremity Assessment: Generalized weakness   Cervical / Trunk Assessment Cervical / Trunk Assessment: Kyphotic   Communication Communication Communication: Impaired Factors Affecting Communication: Hearing impaired   Cognition Arousal: Alert Behavior During Therapy: WFL for tasks assessed/performed Cognition: History of cognitive impairments             OT - Cognition Comments: mild decreased STM, insight and judgement                 Following commands: Intact       Cueing  General Comments   Cueing Techniques: Verbal cues  HR 110-122 during light standing level ADL's and mobility with seated rests as needed           Home Living Family/patient expects to be discharged to:: Assisted living                             Home Equipment: Agricultural consultant (2 wheels)   Additional Comments: pt uses RW, regular toilet with rails      Prior Functioning/Environment Prior Level of Function : Needs assist             Mobility Comments: patient reports indep RW use at ALF ADLs Comments: has assist for bathing and LB self care as well as all IADL's    OT Problem List: Decreased strength;Decreased activity tolerance;Impaired balance (sitting and/or standing);Impaired vision/perception;Decreased cognition;Decreased safety  awareness;Decreased knowledge of precautions;Decreased knowledge of use of DME or AE;Cardiopulmonary status limiting activity;Pain   OT Treatment/Interventions: Self-care/ADL training;Therapeutic exercise;Neuromuscular education;Energy conservation;DME and/or AE instruction;Therapeutic activities;Cognitive remediation/compensation;Patient/family education;Balance training      OT Goals(Current goals can be found in the care plan section)   Acute Rehab OT Goals Patient Stated Goal: to get stronger OT Goal Formulation: With patient/family Time For Goal Achievement: 12/31/23 Potential to Achieve Goals: Fair ADL Goals Pt Will Perform Grooming: with supervision;standing Pt Will Perform Upper Body Dressing: with modified independence;sitting Pt Will Perform Lower Body Dressing: with min assist;sit to/from stand Pt Will Transfer to Toilet: with contact guard assist;ambulating;regular height toilet;grab bars Pt/caregiver will Perform Home Exercise Program: Increased strength;Both right and left upper extremity;With Supervision;With written HEP provided   OT Frequency:  Min 2X/week       AM-PAC OT 6 Clicks Daily Activity     Outcome Measure Help from another person eating meals?: A Little Help from another person taking care of personal grooming?: A Little Help from another person toileting, which includes using toliet, bedpan, or urinal?: A Lot Help from another person bathing (including washing, rinsing, drying)?: A Lot Help from another person to put on and taking off regular upper body clothing?: A Little Help from another person to put on and taking off regular lower body clothing?: A Lot 6 Click Score: 15   End of Session Equipment Utilized During Treatment: Gait belt;Rolling walker (2 wheels) Nurse Communication: Mobility status  Activity Tolerance: Patient  tolerated treatment well Patient left: in chair;with call bell/phone within reach;with chair alarm set;with family/visitor  present  OT Visit Diagnosis: Unsteadiness on feet (R26.81);Muscle weakness (generalized) (M62.81);Low vision, both eyes (H54.2);Pain;Cognitive communication deficit (R41.841)                Time: 1341-1411 OT Time Calculation (min): 30 min Charges:  OT General Charges $OT Visit: 1 Visit OT Evaluation $OT Eval Low Complexity: 1 Low OT Treatments $Self Care/Home Management : 8-22 mins  Jasmarie Coppock OT/L Acute Rehabilitation Department  [phone]  12/29/2023, 4:14 PM

## 2023-12-29 NOTE — Progress Notes (Signed)
 Referring Physician(s): Dr. Kriss  Supervising Physician: Johann Sieving  Patient Status:  [name] - In-pt  Chief Complaint: GI bleed  Subjective: Patient resting.  Hgb improved to 8.2 after 1u PRBC 6/28, 7.3 after embolization, now 7.7. Ongoing dark, melenotic stools.  No BRB.   Allergies: Morphine and codeine, Aspirin, Duloxetine  hcl, Tymlos [abaloparatide], Ventolin  [albuterol ], and Cefadroxil  Medications: Prior to Admission medications   Medication Sig Start Date End Date Taking? Authorizing Provider  acetaminophen  (TYLENOL ) 650 MG CR tablet Take 1,300 mg by mouth in the morning and at bedtime.   Yes [provider]  albuterol  (VENTOLIN  HFA) 108 (90 Base) MCG/ACT inhaler Inhale 2 puffs into the lungs every 4 (four) hours as needed for wheezing or shortness of breath.   Yes [provider]  alendronate (FOSAMAX) 70 MG tablet Take 70 mg by mouth every Sunday. Take with a full glass of water on an empty stomach.   Yes [provider]  Ascorbic Acid  (VITAMIN C ) 1000 MG tablet Take 1,000 mg by mouth every morning.   Yes [provider]  cetirizine  (ZYRTEC ) 5 MG chewable tablet Chew 5 mg by mouth daily as needed for allergies.   Yes [provider]  Cholecalciferol  (VITAMIN D3) 50 MCG (2000 UT) TABS Take 2,000 Units by mouth every morning.   Yes [provider]  cyanocobalamin  (VITAMIN B12) 1000 MCG tablet Take 1,000 mcg by mouth daily.   Yes [provider]  Dorzolamide  HCl-Timolol  Mal PF 2-0.5 % SOLN Place 1 drop into both eyes in the morning and at bedtime.   Yes [provider]  ferrous gluconate  (FERGON) 240 (27 FE) MG tablet Take 240 mg by mouth every morning. Take without food   Yes [provider]  latanoprost  (XALATAN ) 0.005 % ophthalmic solution Place 1 drop into both eyes at bedtime.   Yes [provider]  Multiple Vitamins-Minerals (PRESERVISION AREDS 2) CAPS Take 1 capsule by  mouth 2 (two) times daily.   Yes [provider]  Netarsudil  Dimesylate (RHOPRESSA ) 0.02 % SOLN Place 1 drop into both eyes at bedtime.   Yes [provider]  oxybutynin  (DITROPAN -XL) 10 MG 24 hr tablet Take 10 mg by mouth at bedtime.   Yes [provider]  pantoprazole  (PROTONIX ) 40 MG tablet Take 1 tablet (40 mg total) by mouth 2 (two) times daily before a meal. 11/24/23  Yes Amin, Ankit C, MD  potassium chloride  20 MEQ/15ML (10%) SOLN Take 15 mLs by mouth daily.   Yes [provider]  Probiotic Product (ALIGN) 4 MG CAPS Take 4 mg by mouth at bedtime.   Yes [provider]  rOPINIRole  (REQUIP ) 0.5 MG tablet Take 1 tablet (0.5 mg total) by mouth daily as needed. 04/30/23  Yes [name], [name], NP  senna (SENOKOT) 8.6 MG TABS tablet Take 2 tablets by mouth at bedtime.   Yes [provider]  sertraline  (ZOLOFT ) 50 MG tablet Take 1.5 tablets (75 mg total) by mouth daily. 01/07/23  Yes Samtani, Jai-Gurmukh, MD  simvastatin  (ZOCOR ) 20 MG tablet Take 20 mg by mouth every evening.   Yes [provider]  Sodium Fluoride  (PREVIDENT 5000 BOOSTER PLUS) 1.1 % PSTE Place 1 Application onto teeth See admin instructions. Brush on teeth with a toothbrush after evening mouth care. Spit out excess and do not rinse.   Yes [provider]  torsemide  (DEMADEX ) 20 MG tablet Take 20 mg by mouth daily.   Yes [provider]  traMADol  HCl 25 MG TABS Take 25 mg by mouth 2 (two) times daily. 12/22/23  Yes [name], [name], NP  umeclidinium-vilanterol (ANORO ELLIPTA ) 62.5-25 MCG/ACT AEPB Inhale 1 puff into the lungs every morning.   Yes [provider]  docusate sodium  (COLACE) 100 MG capsule Take 200 mg by mouth at bedtime. Patient not taking: No sig reported    [provider]  potassium chloride  SA (KLOR-CON  M) 20 MEQ tablet Take 1 tablet (20 mEq total) by mouth daily. Patient not taking: Reported on 12/27/2023 11/26/23   Judeth Trenda BIRCH, MD     Vital Signs: BP 128/77   Pulse 92   Temp 98 F (36.7 C) (Oral)   Resp 16   Ht 5' 1 (1.549 m)   Wt 167 lb 15.9 oz (76.2 kg)   SpO2 95%   BMI 31.74 kg/m   Physical Exam NAD, alert Skin: R groin procedure site intact, soft, and non-tender.  No bloody drainage.  Dressing clean and dry.  No evidence of hematoma or pseudoaneurysm.   Imaging: IR EMBO ART  VEN HEMORR LYMPH EXTRAV  INC GUIDE ROADMAPPING Result Date: 12/28/2023 INDICATION: Diverticular bleed.  Bright red blood per rectum. EXAM: Title; MESENTERIC ARTERIOGRAPHY and COIL EMBOLIZATION for LOWER GI BLEED Listed procedures; 1. ULTRASOUND GUIDANCE for RIGHT COMMON FEMORAL ARTERIAL ACCESS 2. INFERIOR MESENTERIC ARTERY ARTERIOGRAPHY 3. COIL EMBOLIZATION of a DISTAL LEFT COLIC BRANCH VESSEL at iDESCENDING COLON COMPARISON:  CTA AP, 12/27/2023 and 04/19/2023 MEDICATIONS: None ANESTHESIA/SEDATION: Moderate (conscious) sedation was employed during this procedure. A total of Versed  4 mg and Fentanyl  100 mcg was administered intravenously. Moderate Sedation Time: 60 minutes. The patient's level of consciousness and vital signs were monitored continuously by radiology nursing throughout the procedure under my direct supervision. CONTRAST:  45 mL Omnipaque  300 FLUOROSCOPY: Radiation Exposure Index and estimated peak skin dose (PSD); Reference air kerma (RAK), 211 mGy. COMPLICATIONS: None immediate. PROCEDURE: Informed consent was obtained from the patient and/or patient's representative following explanation of the procedure, risks, benefits and alternatives. All questions were addressed. A time out was performed prior to the initiation of the procedure. Maximal barrier sterile technique utilized including caps, mask, sterile gowns, sterile gloves, large sterile drape, hand hygiene, and Betadine prep. The RIGHT femoral head was marked fluoroscopically. Under sterile conditions and local anesthesia, the RIGHT common femoral artery access was  performed with a micropuncture needle. Under direct ultrasound guidance, the RIGHT common femoral was accessed with a micropuncture kit. An ultrasound image was saved for documentation purposes. This allowed for placement of a 5 Fr vascular sheath. A limited arteriogram was performed through the side arm of the sheath confirming appropriate access within the RIGHT common femoral artery. Over a Bentson wire, a Mickelson catheter was advanced, reformed, back bled and flushed. The catheter was then utilized to select the inferior mesenteric artery and a selective inferior mesenteric arteriogram was performed. Using a 2.1 Fr Progreat alpha microcatheter and microwire, access into the distal LEFT colic artery branch vessels was performed and selective arteriography was obtained to identify the bleeding vessel. Super selective coil embolization was then performed using a single non detachable micro coil. A post embolization arteriogram was performed without evidence of residual extravasation. Images were reviewed and the procedure was terminated. All wires, catheters and sheaths were removed from the patient. Hemostasis was achieved at the RIGHT groin access site with Angio-Seal closure device. The patient tolerated the procedure well without immediate post procedural complication. FINDINGS: - access via the RIGHT  femoral artery. - No overt extravasation at the descending colon with suspected ooze from IMA territory, distal LEFT colic branch vessel - Successful embolization of the target vessel with a single micro coil - Angio-Seal closure at RIGHT groin. Palpable RLE pulses at the end of the Case IMPRESSION: Successful super-selective coil embolization of a target vessel at site of ooze from IMA territory, LEFT colic branch vessel, as above. PLAN: - The patient is to remain flat for 2 hours with RIGHT leg straight. - The patient may continue to experience several additional bloody bowel movements however should clear the  rectal vault in the coming days. Thom Hall, MD [name] Electronically Signed   By: Thom Hall M.D.   On: 12/28/2023 11:15   IR Angiogram Visceral Selective Result Date: 12/28/2023 INDICATION: Diverticular bleed.  Bright red blood per rectum. EXAM: Title; MESENTERIC ARTERIOGRAPHY and COIL EMBOLIZATION for LOWER GI BLEED Listed procedures; 1. ULTRASOUND GUIDANCE for RIGHT COMMON FEMORAL ARTERIAL ACCESS 2. INFERIOR MESENTERIC ARTERY ARTERIOGRAPHY 3. COIL EMBOLIZATION of a DISTAL LEFT COLIC BRANCH VESSEL at iDESCENDING COLON COMPARISON:  CTA AP, 12/27/2023 and 04/19/2023 MEDICATIONS: None ANESTHESIA/SEDATION: Moderate (conscious) sedation was employed during this procedure. A total of Versed  4 mg and Fentanyl  100 mcg was administered intravenously. Moderate Sedation Time: 60 minutes. The patient's level of consciousness and vital signs were monitored continuously by radiology nursing throughout the procedure under my direct supervision. CONTRAST:  45 mL Omnipaque  300 FLUOROSCOPY: Radiation Exposure Index and estimated peak skin dose (PSD); Reference air kerma (RAK), 211 mGy. COMPLICATIONS: None immediate. PROCEDURE: Informed consent was obtained from the patient and/or patient's representative following explanation of the procedure, risks, benefits and alternatives. All questions were addressed. A time out was performed prior to the initiation of the procedure. Maximal barrier sterile technique utilized including caps, mask, sterile gowns, sterile gloves, large sterile drape, hand hygiene, and Betadine prep. The RIGHT femoral head was marked fluoroscopically. Under sterile conditions and local anesthesia, the RIGHT common femoral artery access was performed with a micropuncture needle. Under direct ultrasound guidance, the RIGHT common femoral was accessed with a micropuncture kit. An ultrasound image was saved for documentation purposes. This allowed  for placement of a 5 Fr vascular sheath. A limited arteriogram was performed through the side arm of the sheath confirming appropriate access within the RIGHT common femoral artery. Over a Bentson wire, a Mickelson catheter was advanced, reformed, back bled and flushed. The catheter was then utilized to select the inferior mesenteric artery and a selective inferior mesenteric arteriogram was performed. Using a 2.1 Fr Progreat alpha microcatheter and microwire, access into the distal LEFT colic artery branch vessels was performed and selective arteriography was obtained to identify the bleeding vessel. Super selective coil embolization was then performed using a single non detachable micro coil. A post embolization arteriogram was performed without evidence of residual extravasation. Images were reviewed and the procedure was terminated. All wires, catheters and sheaths were removed from the patient. Hemostasis was achieved at the RIGHT groin access site with Angio-Seal closure device. The patient tolerated the procedure well without immediate post procedural complication. FINDINGS: - access via the RIGHT femoral artery. - No overt extravasation at the descending colon with suspected ooze from IMA territory, distal LEFT colic branch vessel - Successful embolization of the target vessel with a single micro coil - Angio-Seal closure at RIGHT groin. Palpable RLE pulses at the end of the Case IMPRESSION: Successful super-selective coil embolization of a target vessel  at site of ooze from IMA territory, LEFT colic branch vessel, as above. PLAN: - The patient is to remain flat for 2 hours with RIGHT leg straight. - The patient may continue to experience several additional bloody bowel movements however should clear the rectal vault in the coming days. Thom Hall, MD [facility] Radiology Electronically Signed   By: Thom Hall M.D.   On: 12/28/2023 11:15   IR Angiogram  Selective Each Additional Vessel Result Date: 12/28/2023 INDICATION: Diverticular bleed.  Bright red blood per rectum. EXAM: Title; MESENTERIC ARTERIOGRAPHY and COIL EMBOLIZATION for LOWER GI BLEED Listed procedures; 1. ULTRASOUND GUIDANCE for RIGHT COMMON FEMORAL ARTERIAL ACCESS 2. INFERIOR MESENTERIC ARTERY ARTERIOGRAPHY 3. COIL EMBOLIZATION of a DISTAL LEFT COLIC BRANCH VESSEL at iDESCENDING COLON COMPARISON:  CTA AP, 12/27/2023 and 04/19/2023 MEDICATIONS: None ANESTHESIA/SEDATION: Moderate (conscious) sedation was employed during this procedure. A total of Versed  4 mg and Fentanyl  100 mcg was administered intravenously. Moderate Sedation Time: 60 minutes. The patient's level of consciousness and vital signs were monitored continuously by radiology nursing throughout the procedure under my direct supervision. CONTRAST:  45 mL Omnipaque  300 FLUOROSCOPY: Radiation Exposure Index and estimated peak skin dose (PSD); Reference air kerma (RAK), 211 mGy. COMPLICATIONS: None immediate. PROCEDURE: Informed consent was obtained from the patient and/or patient's representative following explanation of the procedure, risks, benefits and alternatives. All questions were addressed. A time out was performed prior to the initiation of the procedure. Maximal barrier sterile technique utilized including caps, mask, sterile gowns, sterile gloves, large sterile drape, hand hygiene, and Betadine prep. The RIGHT femoral head was marked fluoroscopically. Under sterile conditions and local anesthesia, the RIGHT common femoral artery access was performed with a micropuncture needle. Under direct ultrasound guidance, the RIGHT common femoral was accessed with a micropuncture kit. An ultrasound image was saved for documentation purposes. This allowed for placement of a 5 Fr vascular sheath. A limited arteriogram was performed through the side arm of the sheath confirming appropriate access within the RIGHT common femoral artery. Over a  Bentson wire, a Mickelson catheter was advanced, reformed, back bled and flushed. The catheter was then utilized to select the inferior mesenteric artery and a selective inferior mesenteric arteriogram was performed. Using a 2.1 Fr Progreat alpha microcatheter and microwire, access into the distal LEFT colic artery branch vessels was performed and selective arteriography was obtained to identify the bleeding vessel. Super selective coil embolization was then performed using a single non detachable micro coil. A post embolization arteriogram was performed without evidence of residual extravasation. Images were reviewed and the procedure was terminated. All wires, catheters and sheaths were removed from the patient. Hemostasis was achieved at the RIGHT groin access site with Angio-Seal closure device. The patient tolerated the procedure well without immediate post procedural complication. FINDINGS: - access via the RIGHT femoral artery. - No overt extravasation at the descending colon with suspected ooze from IMA territory, distal LEFT colic branch vessel - Successful embolization of the target vessel with a single micro coil - Angio-Seal closure at RIGHT groin. Palpable RLE pulses at the end of the Case IMPRESSION: Successful super-selective coil embolization of a target vessel at site of ooze from IMA territory, LEFT colic branch vessel, as above. PLAN: - The patient is to remain flat for 2 hours with RIGHT leg straight. - The patient may continue to experience several additional bloody bowel movements however should clear the rectal vault in the coming days. Thom Hall, MD [facility] Radiology Electronically Signed   By: Thom Hall M.D.   On: 12/28/2023 11:15   IR US  Guide Vasc Access Right Result Date: 12/28/2023 INDICATION: Diverticular bleed.  Bright red blood per rectum. EXAM: Title; MESENTERIC ARTERIOGRAPHY and COIL EMBOLIZATION for LOWER GI BLEED Listed  procedures; 1. ULTRASOUND GUIDANCE for RIGHT COMMON FEMORAL ARTERIAL ACCESS 2. INFERIOR MESENTERIC ARTERY ARTERIOGRAPHY 3. COIL EMBOLIZATION of a DISTAL LEFT COLIC BRANCH VESSEL at iDESCENDING COLON COMPARISON:  CTA AP, 12/27/2023 and 04/19/2023 MEDICATIONS: None ANESTHESIA/SEDATION: Moderate (conscious) sedation was employed during this procedure. A total of Versed  4 mg and Fentanyl  100 mcg was administered intravenously. Moderate Sedation Time: 60 minutes. The patient's level of consciousness and vital signs were monitored continuously by radiology nursing throughout the procedure under my direct supervision. CONTRAST:  45 mL Omnipaque  300 FLUOROSCOPY: Radiation Exposure Index and estimated peak skin dose (PSD); Reference air kerma (RAK), 211 mGy. COMPLICATIONS: None immediate. PROCEDURE: Informed consent was obtained from the patient and/or patient's representative following explanation of the procedure, risks, benefits and alternatives. All questions were addressed. A time out was performed prior to the initiation of the procedure. Maximal barrier sterile technique utilized including caps, mask, sterile gowns, sterile gloves, large sterile drape, hand hygiene, and Betadine prep. The RIGHT femoral head was marked fluoroscopically. Under sterile conditions and local anesthesia, the RIGHT common femoral artery access was performed with a micropuncture needle. Under direct ultrasound guidance, the RIGHT common femoral was accessed with a micropuncture kit. An ultrasound image was saved for documentation purposes. This allowed for placement of a 5 Fr vascular sheath. A limited arteriogram was performed through the side arm of the sheath confirming appropriate access within the RIGHT common femoral artery. Over a Bentson wire, a Mickelson catheter was advanced, reformed, back bled and flushed. The catheter was then utilized to select the inferior mesenteric artery and a selective inferior mesenteric arteriogram was  performed. Using a 2.1 Fr Progreat alpha microcatheter and microwire, access into the distal LEFT colic artery branch vessels was performed and selective arteriography was obtained to identify the bleeding vessel. Super selective coil embolization was then performed using a single non detachable micro coil. A post embolization arteriogram was performed without evidence of residual extravasation. Images were reviewed and the procedure was terminated. All wires, catheters and sheaths were removed from the patient. Hemostasis was achieved at the RIGHT groin access site with Angio-Seal closure device. The patient tolerated the procedure well without immediate post procedural complication. FINDINGS: - access via the RIGHT femoral artery. - No overt extravasation at the descending colon with suspected ooze from IMA territory, distal LEFT colic branch vessel - Successful embolization of the target vessel with a single micro coil - Angio-Seal closure at RIGHT groin. Palpable RLE pulses at the end of the Case IMPRESSION: Successful super-selective coil embolization of a target vessel at site of ooze from IMA territory, LEFT colic branch vessel, as above. PLAN: - The patient is to remain flat for 2 hours with RIGHT leg straight. - The patient may continue to experience several additional bloody bowel movements however should clear the rectal vault in the coming days. Thom Hall, MD [facility] Radiology Electronically Signed   By: Thom Hall M.D.   On: 12/28/2023 11:15   CT ANGIO GI BLEED Result Date: 12/27/2023 CLINICAL DATA:  Rectal bleeding.  Left upper abdominal pain. EXAM: CTA ABDOMEN AND PELVIS WITHOUT AND WITH CONTRAST TECHNIQUE: Multidetector CT imaging of the abdomen and  pelvis was performed using the standard protocol during bolus administration of intravenous contrast. Multiplanar reconstructed images and MIPs were obtained and reviewed to evaluate the vascular  anatomy. RADIATION DOSE REDUCTION: This exam was performed according to the departmental dose-optimization program which includes automated exposure control, adjustment of the mA and/or kV according to patient size and/or use of iterative reconstruction technique. CONTRAST:  OMNIPAQUE  IOHEXOL  350 MG/ML SOLN COMPARISON:  None Available. FINDINGS: VASCULAR Aorta: Atherosclerotic disease in the abdominal aorta without aneurysm or dissection or significant stenosis. Celiac: Patent without evidence of aneurysm, dissection, vasculitis or significant stenosis. SMA: Patent without evidence of aneurysm, dissection, vasculitis or significant stenosis. Renals: Single bilateral renal arteries. Left renal artery is widely patent without aneurysm, dissection or stenosis. At least mild stenosis at origin of the right renal artery without aneurysm or dissection. IMA: Patent without evidence of aneurysm, dissection, vasculitis or significant stenosis. 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 Inflow: Patent without evidence of aneurysm, dissection, vasculitis or significant stenosis. Proximal Outflow: Proximal femoral arteries are patent bilaterally. Veins: Portal venous system is patent. Normal appearance of the IVC and renal veins. Normal appearance of the iliac veins. Review of the MIP images confirms the above findings. NON-VASCULAR Lower chest: Peripheral reticular densities at the lung bases are suggestive for chronic changes. No pleural effusions. Hepatobiliary: Gallbladder is mildly distended with high-density sludge or small stones. No gallbladder inflammation. Main portal venous system is patent. No biliary dilatation. Pancreas: Unremarkable. No pancreatic ductal dilatation or surrounding inflammatory changes. Spleen: Normal in size without focal abnormality. Adrenals/Urinary Tract: Normal adrenal glands. No kidney stones. Cyst in the left kidney upper  pole. No hydronephrosis. No suspicious renal lesion. Normal urinary bladder. Stomach/Bowel: Positive for active GI bleeding involving the descending colon. 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 The area of arterial bleeding in the colon is distal to the previously embolized area. Extensive diverticular disease throughout the colon. Endoscopic clip in the right colon near the ileocecal valve. Normal appendix. Normal stomach. No bowel dilatation or obstruction. Lymphatic: No lymph node enlargement in the abdomen or pelvis. Reproductive: Uterus and bilateral adnexa are unremarkable. Other: Negative for ascites.  Negative for free air. Musculoskeletal: No acute bone abnormality. Old vertebral body compression fracture at L1. IMPRESSION: 1. Positive for active GI bleeding involving the descending colon. Findings are most compatible with a diverticular bleed. 2. 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 3. Extensive diverticular disease throughout the colon. 4. Gallbladder sludge or small stones. No gallbladder inflammation. 5. Aortic Atherosclerosis (ICD10-I70.0). These results were called by telephone at the time of interpretation on 12/27/2023 at 5:12 pm to provider Dr. Efrain, who verbally acknowledged these results. Electronically Signed   By: Juliene Balder M.D.   On: 12/27/2023 20:33    Labs:  CBC: Recent Labs    12/27/23 1130 12/27/23 1139 12/28/23 0411 12/28/23 0843 12/28/23 1249 12/29/23 0247 12/29/23 1314  WBC 5.8  --  8.5  --   --  6.8 7.0  HGB 9.4*   < > 8.2* 8.1* 8.0* 7.3* 7.7*  HCT 31.2*   < > 26.7* 26.1* 25.7* 24.4* 25.1*  PLT 170  --  121*  --   --  133* 152   < > = values in this interval not displayed.    COAGS: Recent Labs    03/27/23 2234 04/19/23 1144 11/20/23 0848 12/27/23 1753  INR 1.1 1.3* 1.4* 1.2  APTT 27  --   --  30    BMP: Recent Labs    11/25/23 0508  12/27/23 1130 12/27/23 1139 12/28/23 0411 12/29/23 0247  NA 134* 140 142 140 139  K 3.6 3.4* 3.1* 3.4* 3.6  CL 105 103 101 107 109  CO2 25 28  --  25 23  GLUCOSE 120* 120* 120* 119* 99  BUN 9 11 9 10 9   CALCIUM  7.9* 8.4*  --  7.7* 8.2*  CREATININE 0.33* 0.52 0.60 0.45 0.36*  GFRNONAA >60 >60  --  >60 >60    LIVER FUNCTION TESTS: Recent Labs    11/18/23 0210 11/19/23 0302 12/27/23 1130 12/28/23 0411  BILITOT 0.7 0.8 0.8 1.9*  AST 15 14* 23 20  ALT 10 8 8 9   ALKPHOS 40 29* 65 49  PROT 5.5* 4.5* 6.1* 4.9*  ALBUMIN 3.3* 2.6* 3.5 2.9*    Assessment and Plan: GI bleed s/p branch IMA vessel embolization Patient s/p embolization of vessel suspected to be responsible for GI bleed yesterday by Dr. Hughes. Hgb stable.  Ongoing dark melena.  Black stools yesterday, more dark maroon today per Charity fundraiser. No complaint of belly pain or discomfort during visit. Procedure site stable.  No current needs in IR.    Electronically Signed: Keo Schirmer Sue-Ellen Massimo [place], PA 12/29/2023, 2:43 PM   I spent a total of 15 Minutes at the the patient's bedside AND on the patient's hospital floor or unit, greater than 50% of which was counseling/coordinating care for GI bleed.

## 2023-12-29 NOTE — Plan of Care (Signed)
  Problem: Clinical Measurements: Goal: Diagnostic test results will improve Outcome: Progressing Goal: Cardiovascular complication will be avoided Outcome: Progressing   Problem: Nutrition: Goal: Adequate nutrition will be maintained Outcome: Progressing   Problem: Pain Managment: Goal: General experience of comfort will improve and/or be controlled Outcome: Progressing   Problem: Coping: Goal: Level of anxiety will decrease Outcome: Not Progressing   Problem: Elimination: Goal: Will not experience complications related to bowel motility Outcome: Not Progressing

## 2023-12-29 NOTE — Progress Notes (Addendum)
 PROGRESS NOTE  [name] FMW:[id number] DOB: 1940-11-29 DOA: 12/27/2023 PCP: Gil Greig BRAVO, NP   LOS: 1 day   Brief narrative:  [name] is a 83 y.o. female with past medical history significant for chronic iron  deficiency anemia with baseline hemoglobin 8-10, COPD, presented hospital with bright red blood per rectum treated for himself at home.  Baseline hemoglobin around 8-10.  In the ED patient was slightly tachypneic.  Labs showed mild hypokalemia with potassium of 3.4.  Hemoglobin was 9.4 followed by 8.3 and 6.4.  CTA GI bleed study showed evidence of active contrast extravasation into the descending colon suggestive of active diverticular bleed.  GI was consulted and subsequently patient was taken for embolization by IR.  Received 1 unit of packed RBC and IV fluid bolus.  Patient was then admitted hospital for further evaluation and treatment.  Assessment/Plan: Principal Problem:   Acute lower GI bleeding Active Problems:   GI bleed   Hyperlipidemia   COPD (chronic obstructive pulmonary disease) (HCC)   Restless leg syndrome   Hypertension   GERD (gastroesophageal reflux disease)   GAD (generalized anxiety disorder)   Chronic heart failure with preserved ejection fraction (HFpEF) (HCC)   Acute on chronic blood loss anemia   Hypokalemia   Acute lower gastrointestinal bleed:  1 day history of new onset hematochezia with 8-9 total episodes.  CTA showed active extravasation in the descending colon.  Patient was taken by IR for embolization.  Patient underwent intervention and subsequently has remained stable but still with some black-maroon stools.  Has some tachycardia.  Received 1 unit of packed RBC.  Hemoglobin today at 7.7 from 8.1 yesterday.    Tachycardia.  Appears to be sinus tachycardia.  Mild anxiety noted.  Will transfuse hemoglobin as necessary.  Will continue to monitor closely.  Check EKG     Acute blood loss anemia superimposed on chronic iron  deficiency anemia:   baseline hemoglobin 8-10, initial hemoglobin of 6.4, in the setting of presenting acute lower gastrointestinal bleed.  Received 1 unit of packed RBC with improvement in hemoglobin of 7.7 today.  Has had black-maroon stools likely from previous.  Will continue to monitor.  Mild hypokalemia.  Improved after patient.  Recent potassium was 3.6    COPD: On Anoro Ellipta .  Appears compensated.  Continue nebulizers while in hospital  Debility, deconditioning, weakness.  Patient is from assisted living facility and lives with her husband.   PT OT recommends skilled nursing facility.  DVT prophylaxis: SCDs Start: 12/27/23 2345  Disposition: Patient is from assisted living facility and PT has recommended skilled nursing facility placement at this time  Status is: Inpatient The patient is inpatient because: Status post IR embolization plan for skilled facility placement.  Code Status:     Code Status: Do not attempt resuscitation (DNR) PRE-ARREST INTERVENTIONS DESIRED  Family Communication:  Spoke with the patient's son on the phone and updated him about the medical condition of the patient.  Consultants: GI Interventional radiology PCCM  Procedures: IR guided coil embolization of inferior mesenteric branch artery on 12/27/2023 PRBC transfusion.  Anti-infectives:  None  Anti-infectives (From admission, onward)    None      Subjective: Today, patient was seen and examined at bedside.  Patient has mild abdominal discomfort but no nausea vomiting or bloody bowel movements.  Has had bowel movement early this morning.  Has a walker for ambulation and lives with her husband at her assisted living facility.  Objective: Vitals:  12/29/23 0800 12/29/23 0823  BP:    Pulse:    Resp:    Temp: 98 F (36.7 C)   SpO2:  95%    Intake/Output Summary (Last 24 hours) at 12/29/2023 1414 Last data filed at 12/29/2023 0912 Gross per 24 hour  Intake 10 ml  Output 1310 ml  Net -1300 ml    Filed Weights   12/27/23 0947 12/28/23 0257 12/29/23 0500  Weight: 75 kg 76.7 kg 76.2 kg   Body mass index is 31.74 kg/m.   Physical Exam: GENERAL: Patient is alert awake and Communicative. Not in obvious distress.  Obese build, elderly female, appears chronically deconditioned.,  Appears anxious HENT: Pallor noted, pupils equally reactive to light. Oral mucosa is moist NECK: is supple, no gross swelling noted. CHEST: Clear to auscultation. No crackles or wheezes.  Diminished breath sounds bilaterally. CVS: S1 and S2 heard, no murmur.  Mild tachycardia ABDOMEN: Soft, mild tenderness of the right lower quadrant bowel sounds are present. EXTREMITIES: No edema. CNS: Cranial nerves are intact.  Moves all extremities, generalized weakness noted SKIN: warm and dry without rashes.  Data Review: I have personally reviewed the following laboratory data and studies,  CBC: Recent Labs  Lab 12/27/23 1130 12/27/23 1139 12/28/23 0411 12/28/23 0843 12/28/23 1249 12/29/23 0247 12/29/23 1314  WBC 5.8  --  8.5  --   --  6.8 7.0  NEUTROABS 4.0  --  7.1  --   --   --   --   HGB 9.4*   < > 8.2* 8.1* 8.0* 7.3* 7.7*  HCT 31.2*   < > 26.7* 26.1* 25.7* 24.4* 25.1*  MCV 99.7  --  97.4  --   --  98.8 98.4  PLT 170  --  121*  --   --  133* 152   < > = values in this interval not displayed.   Basic Metabolic Panel: Recent Labs  Lab 12/27/23 1130 12/27/23 1139 12/28/23 0411 12/29/23 0247  NA 140 142 140 139  K 3.4* 3.1* 3.4* 3.6  CL 103 101 107 109  CO2 28  --  25 23  GLUCOSE 120* 120* 119* 99  BUN 11 9 10 9   CREATININE 0.52 0.60 0.45 0.36*  CALCIUM  8.4*  --  7.7* 8.2*  MG  --   --  1.9 2.5*   Liver Function Tests: Recent Labs  Lab 12/27/23 1130 12/28/23 0411  AST 23 20  ALT 8 9  ALKPHOS 65 49  BILITOT 0.8 1.9*  PROT 6.1* 4.9*  ALBUMIN 3.5 2.9*   No results for input(s): LIPASE, AMYLASE in the last 168 hours. No results for input(s): AMMONIA in the last 168  hours. Cardiac Enzymes: No results for input(s): CKTOTAL, CKMB, CKMBINDEX, TROPONINI in the last 168 hours. BNP (last 3 results) Recent Labs    10/25/23 0510  BNP 46.6    ProBNP (last 3 results) No results for input(s): PROBNP in the last 8760 hours.  CBG: No results for input(s): GLUCAP in the last 168 hours. Recent Results (from the past 240 hours)  MRSA Next Gen by PCR, Nasal     Status: None   Collection Time: 12/28/23  2:57 AM   Specimen: Nasal Mucosa; Nasal Swab  Result Value Ref Range Status   MRSA by PCR Next Gen NOT DETECTED NOT DETECTED Final    Comment: (NOTE) The GeneXpert MRSA Assay (FDA approved for NASAL specimens only), is one component of a comprehensive MRSA colonization surveillance program. It  is not intended to diagnose MRSA infection nor to guide or monitor treatment for MRSA infections. Test performance is not FDA approved in patients less than 39 years old. Performed at [facility], [address] [place], [place] [address]      Studies: IR EMBO ART  VEN HEMORR LYMPH EXTRAV  INC GUIDE ROADMAPPING Result Date: 12/28/2023 INDICATION: Diverticular bleed.  Bright red blood per rectum. EXAM: Title; MESENTERIC ARTERIOGRAPHY and COIL EMBOLIZATION for LOWER GI BLEED Listed procedures; 1. ULTRASOUND GUIDANCE for RIGHT COMMON FEMORAL ARTERIAL ACCESS 2. INFERIOR MESENTERIC ARTERY ARTERIOGRAPHY 3. COIL EMBOLIZATION of a DISTAL LEFT COLIC BRANCH VESSEL at iDESCENDING COLON COMPARISON:  CTA AP, 12/27/2023 and 04/19/2023 MEDICATIONS: None ANESTHESIA/SEDATION: Moderate (conscious) sedation was employed during this procedure. A total of Versed  4 mg and Fentanyl  100 mcg was administered intravenously. Moderate Sedation Time: 60 minutes. The patient's level of consciousness and vital signs were monitored continuously by radiology nursing throughout the procedure under my direct supervision. CONTRAST:  45 mL Omnipaque  300 FLUOROSCOPY: Radiation  Exposure Index and estimated peak skin dose (PSD); Reference air kerma (RAK), 211 mGy. COMPLICATIONS: None immediate. PROCEDURE: Informed consent was obtained from the patient and/or patient's representative following explanation of the procedure, risks, benefits and alternatives. All questions were addressed. A time out was performed prior to the initiation of the procedure. Maximal barrier sterile technique utilized including caps, mask, sterile gowns, sterile gloves, large sterile drape, hand hygiene, and Betadine prep. The RIGHT femoral head was marked fluoroscopically. Under sterile conditions and local anesthesia, the RIGHT common femoral artery access was performed with a micropuncture needle. Under direct ultrasound guidance, the RIGHT common femoral was accessed with a micropuncture kit. An ultrasound image was saved for documentation purposes. This allowed for placement of a 5 Fr vascular sheath. A limited arteriogram was performed through the side arm of the sheath confirming appropriate access within the RIGHT common femoral artery. Over a Bentson wire, a Mickelson catheter was advanced, reformed, back bled and flushed. The catheter was then utilized to select the inferior mesenteric artery and a selective inferior mesenteric arteriogram was performed. Using a 2.1 Fr Progreat alpha microcatheter and microwire, access into the distal LEFT colic artery branch vessels was performed and selective arteriography was obtained to identify the bleeding vessel. Super selective coil embolization was then performed using a single non detachable micro coil. A post embolization arteriogram was performed without evidence of residual extravasation. Images were reviewed and the procedure was terminated. All wires, catheters and sheaths were removed from the patient. Hemostasis was achieved at the RIGHT groin access site with Angio-Seal closure device. The patient tolerated the procedure well without immediate post  procedural complication. FINDINGS: - access via the RIGHT femoral artery. - No overt extravasation at the descending colon with suspected ooze from IMA territory, distal LEFT colic branch vessel - Successful embolization of the target vessel with a single micro coil - Angio-Seal closure at RIGHT groin. Palpable RLE pulses at the end of the Case IMPRESSION: Successful super-selective coil embolization of a target vessel at site of ooze from IMA territory, LEFT colic branch vessel, as above. PLAN: - The patient is to remain flat for 2 hours with RIGHT leg straight. - The patient may continue to experience several additional bloody bowel movements however should clear the rectal vault in the coming days. Thom Hall, MD [facility] Radiology Electronically Signed   By: Thom Hall M.D.   On: 12/28/2023 11:15   IR  Angiogram Visceral Selective Result Date: 12/28/2023 INDICATION: Diverticular bleed.  Bright red blood per rectum. EXAM: Title; MESENTERIC ARTERIOGRAPHY and COIL EMBOLIZATION for LOWER GI BLEED Listed procedures; 1. ULTRASOUND GUIDANCE for RIGHT COMMON FEMORAL ARTERIAL ACCESS 2. INFERIOR MESENTERIC ARTERY ARTERIOGRAPHY 3. COIL EMBOLIZATION of a DISTAL LEFT COLIC BRANCH VESSEL at iDESCENDING COLON COMPARISON:  CTA AP, 12/27/2023 and 04/19/2023 MEDICATIONS: None ANESTHESIA/SEDATION: Moderate (conscious) sedation was employed during this procedure. A total of Versed  4 mg and Fentanyl  100 mcg was administered intravenously. Moderate Sedation Time: 60 minutes. The patient's level of consciousness and vital signs were monitored continuously by radiology nursing throughout the procedure under my direct supervision. CONTRAST:  45 mL Omnipaque  300 FLUOROSCOPY: Radiation Exposure Index and estimated peak skin dose (PSD); Reference air kerma (RAK), 211 mGy. COMPLICATIONS: None immediate. PROCEDURE: Informed consent was obtained from the patient and/or patient's  representative following explanation of the procedure, risks, benefits and alternatives. All questions were addressed. A time out was performed prior to the initiation of the procedure. Maximal barrier sterile technique utilized including caps, mask, sterile gowns, sterile gloves, large sterile drape, hand hygiene, and Betadine prep. The RIGHT femoral head was marked fluoroscopically. Under sterile conditions and local anesthesia, the RIGHT common femoral artery access was performed with a micropuncture needle. Under direct ultrasound guidance, the RIGHT common femoral was accessed with a micropuncture kit. An ultrasound image was saved for documentation purposes. This allowed for placement of a 5 Fr vascular sheath. A limited arteriogram was performed through the side arm of the sheath confirming appropriate access within the RIGHT common femoral artery. Over a Bentson wire, a Mickelson catheter was advanced, reformed, back bled and flushed. The catheter was then utilized to select the inferior mesenteric artery and a selective inferior mesenteric arteriogram was performed. Using a 2.1 Fr Progreat alpha microcatheter and microwire, access into the distal LEFT colic artery branch vessels was performed and selective arteriography was obtained to identify the bleeding vessel. Super selective coil embolization was then performed using a single non detachable micro coil. A post embolization arteriogram was performed without evidence of residual extravasation. Images were reviewed and the procedure was terminated. All wires, catheters and sheaths were removed from the patient. Hemostasis was achieved at the RIGHT groin access site with Angio-Seal closure device. The patient tolerated the procedure well without immediate post procedural complication. FINDINGS: - access via the RIGHT femoral artery. - No overt extravasation at the descending colon with suspected ooze from IMA territory, distal LEFT colic branch vessel -  Successful embolization of the target vessel with a single micro coil - Angio-Seal closure at RIGHT groin. Palpable RLE pulses at the end of the Case IMPRESSION: Successful super-selective coil embolization of a target vessel at site of ooze from IMA territory, LEFT colic branch vessel, as above. PLAN: - The patient is to remain flat for 2 hours with RIGHT leg straight. - The patient may continue to experience several additional bloody bowel movements however should clear the rectal vault in the coming days. Thom Hall, MD [facility] Radiology Electronically Signed   By: Thom Hall M.D.   On: 12/28/2023 11:15   IR Angiogram Selective Each Additional Vessel Result Date: 12/28/2023 INDICATION: Diverticular bleed.  Bright red blood per rectum. EXAM: Title; MESENTERIC ARTERIOGRAPHY and COIL EMBOLIZATION for LOWER GI BLEED Listed procedures; 1. ULTRASOUND GUIDANCE for RIGHT COMMON FEMORAL ARTERIAL ACCESS 2. INFERIOR MESENTERIC ARTERY ARTERIOGRAPHY 3. COIL EMBOLIZATION of a DISTAL LEFT COLIC BRANCH VESSEL at iDESCENDING COLON  COMPARISON:  CTA AP, 12/27/2023 and 04/19/2023 MEDICATIONS: None ANESTHESIA/SEDATION: Moderate (conscious) sedation was employed during this procedure. A total of Versed  4 mg and Fentanyl  100 mcg was administered intravenously. Moderate Sedation Time: 60 minutes. The patient's level of consciousness and vital signs were monitored continuously by radiology nursing throughout the procedure under my direct supervision. CONTRAST:  45 mL Omnipaque  300 FLUOROSCOPY: Radiation Exposure Index and estimated peak skin dose (PSD); Reference air kerma (RAK), 211 mGy. COMPLICATIONS: None immediate. PROCEDURE: Informed consent was obtained from the patient and/or patient's representative following explanation of the procedure, risks, benefits and alternatives. All questions were addressed. A time out was performed prior to the initiation of the procedure.  Maximal barrier sterile technique utilized including caps, mask, sterile gowns, sterile gloves, large sterile drape, hand hygiene, and Betadine prep. The RIGHT femoral head was marked fluoroscopically. Under sterile conditions and local anesthesia, the RIGHT common femoral artery access was performed with a micropuncture needle. Under direct ultrasound guidance, the RIGHT common femoral was accessed with a micropuncture kit. An ultrasound image was saved for documentation purposes. This allowed for placement of a 5 Fr vascular sheath. A limited arteriogram was performed through the side arm of the sheath confirming appropriate access within the RIGHT common femoral artery. Over a Bentson wire, a Mickelson catheter was advanced, reformed, back bled and flushed. The catheter was then utilized to select the inferior mesenteric artery and a selective inferior mesenteric arteriogram was performed. Using a 2.1 Fr Progreat alpha microcatheter and microwire, access into the distal LEFT colic artery branch vessels was performed and selective arteriography was obtained to identify the bleeding vessel. Super selective coil embolization was then performed using a single non detachable micro coil. A post embolization arteriogram was performed without evidence of residual extravasation. Images were reviewed and the procedure was terminated. All wires, catheters and sheaths were removed from the patient. Hemostasis was achieved at the RIGHT groin access site with Angio-Seal closure device. The patient tolerated the procedure well without immediate post procedural complication. FINDINGS: - access via the RIGHT femoral artery. - No overt extravasation at the descending colon with suspected ooze from IMA territory, distal LEFT colic branch vessel - Successful embolization of the target vessel with a single micro coil - Angio-Seal closure at RIGHT groin. Palpable RLE pulses at the end of the Case IMPRESSION: Successful super-selective  coil embolization of a target vessel at site of ooze from IMA territory, LEFT colic branch vessel, as above. PLAN: - The patient is to remain flat for 2 hours with RIGHT leg straight. - The patient may continue to experience several additional bloody bowel movements however should clear the rectal vault in the coming days. Thom Hall, MD [facility] Radiology Electronically Signed   By: Thom Hall M.D.   On: 12/28/2023 11:15   IR US  Guide Vasc Access Right Result Date: 12/28/2023 INDICATION: Diverticular bleed.  Bright red blood per rectum. EXAM: Title; MESENTERIC ARTERIOGRAPHY and COIL EMBOLIZATION for LOWER GI BLEED Listed procedures; 1. ULTRASOUND GUIDANCE for RIGHT COMMON FEMORAL ARTERIAL ACCESS 2. INFERIOR MESENTERIC ARTERY ARTERIOGRAPHY 3. COIL EMBOLIZATION of a DISTAL LEFT COLIC BRANCH VESSEL at iDESCENDING COLON COMPARISON:  CTA AP, 12/27/2023 and 04/19/2023 MEDICATIONS: None ANESTHESIA/SEDATION: Moderate (conscious) sedation was employed during this procedure. A total of Versed  4 mg and Fentanyl  100 mcg was administered intravenously. Moderate Sedation Time: 60 minutes. The patient's level of consciousness and vital signs were monitored continuously by radiology nursing throughout the procedure under my  direct supervision. CONTRAST:  45 mL Omnipaque  300 FLUOROSCOPY: Radiation Exposure Index and estimated peak skin dose (PSD); Reference air kerma (RAK), 211 mGy. COMPLICATIONS: None immediate. PROCEDURE: Informed consent was obtained from the patient and/or patient's representative following explanation of the procedure, risks, benefits and alternatives. All questions were addressed. A time out was performed prior to the initiation of the procedure. Maximal barrier sterile technique utilized including caps, mask, sterile gowns, sterile gloves, large sterile drape, hand hygiene, and Betadine prep. The RIGHT femoral head was marked fluoroscopically. Under  sterile conditions and local anesthesia, the RIGHT common femoral artery access was performed with a micropuncture needle. Under direct ultrasound guidance, the RIGHT common femoral was accessed with a micropuncture kit. An ultrasound image was saved for documentation purposes. This allowed for placement of a 5 Fr vascular sheath. A limited arteriogram was performed through the side arm of the sheath confirming appropriate access within the RIGHT common femoral artery. Over a Bentson wire, a Mickelson catheter was advanced, reformed, back bled and flushed. The catheter was then utilized to select the inferior mesenteric artery and a selective inferior mesenteric arteriogram was performed. Using a 2.1 Fr Progreat alpha microcatheter and microwire, access into the distal LEFT colic artery branch vessels was performed and selective arteriography was obtained to identify the bleeding vessel. Super selective coil embolization was then performed using a single non detachable micro coil. A post embolization arteriogram was performed without evidence of residual extravasation. Images were reviewed and the procedure was terminated. All wires, catheters and sheaths were removed from the patient. Hemostasis was achieved at the RIGHT groin access site with Angio-Seal closure device. The patient tolerated the procedure well without immediate post procedural complication. FINDINGS: - access via the RIGHT femoral artery. - No overt extravasation at the descending colon with suspected ooze from IMA territory, distal LEFT colic branch vessel - Successful embolization of the target vessel with a single micro coil - Angio-Seal closure at RIGHT groin. Palpable RLE pulses at the end of the Case IMPRESSION: Successful super-selective coil embolization of a target vessel at site of ooze from IMA territory, LEFT colic branch vessel, as above. PLAN: - The patient is to remain flat for 2 hours with RIGHT leg straight. - The patient may  continue to experience several additional bloody bowel movements however should clear the rectal vault in the coming days. Thom Hall, MD [facility] Radiology Electronically Signed   By: Thom Hall M.D.   On: 12/28/2023 11:15   CT ANGIO GI BLEED Result Date: 12/27/2023 CLINICAL DATA:  Rectal bleeding.  Left upper abdominal pain. EXAM: CTA ABDOMEN AND PELVIS WITHOUT AND WITH CONTRAST TECHNIQUE: Multidetector CT imaging of the abdomen and pelvis was performed using the standard protocol during bolus administration of intravenous contrast. Multiplanar reconstructed images and MIPs were obtained and reviewed to evaluate the vascular anatomy. RADIATION DOSE REDUCTION: This exam was performed according to the departmental dose-optimization program which includes automated exposure control, adjustment of the mA and/or kV according to patient size and/or use of iterative reconstruction technique. CONTRAST:  OMNIPAQUE  IOHEXOL  350 MG/ML SOLN COMPARISON:  None Available. FINDINGS: VASCULAR Aorta: Atherosclerotic disease in the abdominal aorta without aneurysm or dissection or significant stenosis. Celiac: Patent without evidence of aneurysm, dissection, vasculitis or significant stenosis. SMA: Patent without evidence of aneurysm, dissection, vasculitis or significant stenosis. Renals: Single bilateral renal arteries. Left renal artery is widely patent without aneurysm, dissection or stenosis. At least mild stenosis at origin of  the right renal artery without aneurysm or dissection. IMA: Patent without evidence of aneurysm, dissection, vasculitis or significant stenosis. 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 Inflow: Patent without evidence of aneurysm, dissection, vasculitis or significant stenosis. Proximal Outflow: Proximal femoral arteries are patent bilaterally. Veins: Portal venous system is  patent. Normal appearance of the IVC and renal veins. Normal appearance of the iliac veins. Review of the MIP images confirms the above findings. NON-VASCULAR Lower chest: Peripheral reticular densities at the lung bases are suggestive for chronic changes. No pleural effusions. Hepatobiliary: Gallbladder is mildly distended with high-density sludge or small stones. No gallbladder inflammation. Main portal venous system is patent. No biliary dilatation. Pancreas: Unremarkable. No pancreatic ductal dilatation or surrounding inflammatory changes. Spleen: Normal in size without focal abnormality. Adrenals/Urinary Tract: Normal adrenal glands. No kidney stones. Cyst in the left kidney upper pole. No hydronephrosis. No suspicious renal lesion. Normal urinary bladder. Stomach/Bowel: Positive for active GI bleeding involving the descending colon. 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 The area of arterial bleeding in the colon is distal to the previously embolized area. Extensive diverticular disease throughout the colon. Endoscopic clip in the right colon near the ileocecal valve. Normal appendix. Normal stomach. No bowel dilatation or obstruction. Lymphatic: No lymph node enlargement in the abdomen or pelvis. Reproductive: Uterus and bilateral adnexa are unremarkable. Other: Negative for ascites.  Negative for free air. Musculoskeletal: No acute bone abnormality. Old vertebral body compression fracture at L1. IMPRESSION: 1. Positive for active GI bleeding involving the descending colon. Findings are most compatible with a diverticular bleed. 2. 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 3. Extensive diverticular disease throughout the colon. 4. Gallbladder sludge or small stones. No gallbladder inflammation. 5. Aortic Atherosclerosis (ICD10-I70.0). These results were called by telephone at the time of  interpretation on 12/27/2023 at 5:12 pm to provider Dr. Efrain, who verbally acknowledged these results. Electronically Signed   By: Juliene Balder M.D.   On: 12/27/2023 20:33      Vernal Alstrom, MD  [facility] 12/29/2023  If 7PM-7AM, please contact night-coverage

## 2023-12-29 NOTE — TOC Progression Note (Signed)
 Transition of Care [facility]) - Progression Note    Patient Details  Name: [name] MRN: [id number] Date of Birth: 10-Sep-1940  [facility] CM/SW Contact  Jon ONEIDA Anon, RN Phone Number: 12/29/2023, 3:11 PM  Clinical Narrative:    NCM spoke with pt son to notify him of PT/OT recommending SNF at discharge. Pt son agreeable to recommendation. NCM spoke with Izetta at [facility] and she states once pt is ready to DC she will have a STR bed available. Will need FL2, DC summary, and insurance auth. TOC is following.   Expected Discharge Plan: Assisted Living Barriers to Discharge: Continued Medical Work up  Expected Discharge Plan and Services   Discharge Planning Services: CM Consult   Living arrangements for the past 2 months: Assisted Living Facility                                       Social Determinants of Health (SDOH) Interventions SDOH Screenings   Food Insecurity: No Food Insecurity (12/28/2023)  Housing: Low Risk  (12/28/2023)  Transportation Needs: No Transportation Needs (12/28/2023)  Utilities: Not At Risk (12/28/2023)  Alcohol  Screen: Low Risk  (04/04/2023)  Depression (PHQ2-9): Low Risk  (10/24/2023)  Financial Resource Strain: Low Risk  (04/04/2023)  Physical Activity: Insufficiently Active (04/04/2023)  Social Connections: Socially Integrated (12/28/2023)  Stress: No Stress Concern Present (04/04/2023)  Tobacco Use: Medium Risk (12/28/2023)  Health Literacy: Adequate Health Literacy (04/04/2023)    Readmission Risk Interventions    11/19/2023   11:31 AM 04/29/2023   11:10 AM 04/21/2023    2:05 PM  Readmission Risk Prevention Plan  Transportation Screening Complete Complete Complete  PCP or Specialist Appt within 5-7 Days Complete    PCP or Specialist Appt within 3-5 Days  Complete Complete  Home Care Screening Complete    Medication Review (RN CM) Complete    HRI or Home Care Consult  Complete Complete  Social Work Consult for  Recovery Care Planning/Counseling  Complete Complete  Palliative Care Screening  Not Applicable Not Applicable  Medication Review Oceanographer)  Complete Complete

## 2023-12-29 NOTE — Progress Notes (Signed)
 [facility] Progress Note  SUBJECTIVE:   Interval history: [name] was seen and evaluated today at bedside. Resting in chair at bedside. She noted having some RUQ abdominal discomfort which improved after having a bowel movement. She is hungry, no nausea or vomiting. No chest pain or shortness of breath. Per nursing staff, patient had 1 small bloody BM this AM and 1 larger bloody BM this AM, both with maroon colored blood and not bright red.   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, left side   Sleeping excessive    Tinnitus of both ears    Past Surgical History:  Procedure Laterality Date   BALLOON DILATION N/A 05/08/2021   Procedure: BALLOON DILATION;  Surgeon: Saintclair Jasper, MD;  Location: [facility] ENDOSCOPY;  Service: Gastroenterology;  Laterality: N/A;   BASAL CELL CARCINOMA EXCISION  2001   off my nose, left side   BIOPSY  05/08/2021   Procedure: BIOPSY;  Surgeon: Saintclair Jasper, MD;  Location: [facility] ENDOSCOPY;  Service: Gastroenterology;;   BIOPSY  04/25/2023   Procedure: BIOPSY;  Surgeon: Dianna Specking, MD;  Location: [facility] ENDOSCOPY;  Service: Gastroenterology;;   BLEPHAROPLASTY Bilateral    BONE BIOPSY  10/26/2023   Procedure: BIOPSY, GI;  Surgeon: Elicia Claw, MD;  Location: [facility] ENDOSCOPY;  Service: Gastroenterology;;   CATARACT EXTRACTION W/ INTRAOCULAR LENS  IMPLANT, BILATERAL Bilateral 1990's   COLONOSCOPY N/A 10/26/2023    Procedure: COLONOSCOPY;  Surgeon: Elicia Claw, MD;  Location: [facility] ENDOSCOPY;  Service: Gastroenterology;  Laterality: N/A;   COLONOSCOPY WITH PROPOFOL  N/A 05/04/2022   Procedure: COLONOSCOPY WITH PROPOFOL ;  Surgeon: Saintclair Jasper, MD;  Location: [facility] ENDOSCOPY;  Service: Gastroenterology;  Laterality: N/A;   COLONOSCOPY WITH PROPOFOL  N/A 04/25/2023   Procedure: COLONOSCOPY WITH PROPOFOL ;  Surgeon: Dianna Specking, MD;  Location: [facility] ENDOSCOPY;  Service: Gastroenterology;  Laterality: N/A;   ENTEROSCOPY N/A 11/20/2023   Procedure: ENTEROSCOPY;  Surgeon: Dianna Specking, MD;  Location: [facility] ENDOSCOPY;  Service: Gastroenterology;  Laterality: N/A;   ESOPHAGOGASTRODUODENOSCOPY N/A 10/26/2023   Procedure: EGD (ESOPHAGOGASTRODUODENOSCOPY);  Surgeon: Elicia Claw, MD;  Location: [facility] ENDOSCOPY;  Service: Gastroenterology;  Laterality: N/A;   ESOPHAGOGASTRODUODENOSCOPY N/A 11/20/2023   Procedure: EGD (ESOPHAGOGASTRODUODENOSCOPY);  Surgeon: Dianna Specking, MD;  Location: [facility] ENDOSCOPY;  Service: Gastroenterology;  Laterality: N/A;   ESOPHAGOGASTRODUODENOSCOPY (EGD) WITH PROPOFOL  N/A 05/08/2021   Procedure: ESOPHAGOGASTRODUODENOSCOPY (EGD) WITH PROPOFOL ;  Surgeon: Saintclair Jasper, MD;  Location: [facility] ENDOSCOPY;  Service: Gastroenterology;  Laterality: N/A;   ESOPHAGOGASTRODUODENOSCOPY (EGD) WITH PROPOFOL  N/A 01/11/2022   Procedure: ESOPHAGOGASTRODUODENOSCOPY (EGD) WITH PROPOFOL ;  Surgeon: Rosalie Kitchens, MD;  Location: [facility] ENDOSCOPY;  Service: Gastroenterology;  Laterality: N/A;   ESOPHAGOGASTRODUODENOSCOPY (EGD) WITH PROPOFOL  N/A 01/06/2023   Procedure: ESOPHAGOGASTRODUODENOSCOPY (EGD) WITH PROPOFOL ;  Surgeon: Rosalie Kitchens, MD;  Location: [facility] ENDOSCOPY;  Service: Gastroenterology;  Laterality: N/A;   ESOPHAGOGASTRODUODENOSCOPY (EGD) WITH PROPOFOL  N/A 04/21/2023  Procedure: ESOPHAGOGASTRODUODENOSCOPY (EGD) WITH PROPOFOL ;  Surgeon: Dianna Specking, MD;  Location: [facility] ENDOSCOPY;  Service: Gastroenterology;  Laterality:  N/A;   EYE SURGERY Bilateral    to improve vision after cataract OR   GIVENS CAPSULE STUDY N/A 04/21/2023   Procedure: GIVENS CAPSULE STUDY;  Surgeon: Dianna Specking, MD;  Location: [facility] ENDOSCOPY;  Service: Gastroenterology;  Laterality: N/A;   GIVENS CAPSULE STUDY N/A 11/18/2023   Procedure: IMAGING PROCEDURE, GI TRACT, INTRALUMINAL, VIA CAPSULE;  Surgeon: Dianna Specking, MD;  Location: [facility] ENDOSCOPY;  Service: Gastroenterology;  Laterality: N/A;   HEMOSTASIS CLIP PLACEMENT  10/26/2023   Procedure: CONTROL OF HEMORRHAGE, GI TRACT, ENDOSCOPIC, BY CLIPPING OR OVERSEWING;  Surgeon: Elicia Claw, MD;  Location: [facility] ENDOSCOPY;  Service: Gastroenterology;;   IR ANGIOGRAM FOLLOW UP STUDY  12/19/2020   IR ANGIOGRAM SELECTIVE EACH ADDITIONAL VESSEL  12/19/2020   IR ANGIOGRAM SELECTIVE EACH ADDITIONAL VESSEL  12/19/2020   IR ANGIOGRAM SELECTIVE EACH ADDITIONAL VESSEL  12/28/2023   IR ANGIOGRAM VISCERAL SELECTIVE  12/19/2020   IR ANGIOGRAM VISCERAL SELECTIVE  12/28/2023   IR EMBO ART  VEN HEMORR LYMPH EXTRAV  INC GUIDE ROADMAPPING  12/19/2020   IR EMBO ART  VEN HEMORR LYMPH EXTRAV  INC GUIDE ROADMAPPING  12/28/2023   IR US  GUIDE VASC ACCESS RIGHT  12/19/2020   IR US  GUIDE VASC ACCESS RIGHT  12/28/2023   JOINT REPLACEMENT     POLYPECTOMY  05/04/2022   Procedure: POLYPECTOMY;  Surgeon: Saintclair Jasper, MD;  Location: [facility] ENDOSCOPY;  Service: Gastroenterology;;   POLYPECTOMY  10/26/2023   Procedure: POLYPECTOMY, INTESTINE;  Surgeon: Elicia Claw, MD;  Location: [facility] ENDOSCOPY;  Service: Gastroenterology;;   STAPEDES SURGERY Left    scraped stapedes because it was sticking when it wasn't suppose to   TONSILLECTOMY AND ADENOIDECTOMY  1946   TOTAL KNEE ARTHROPLASTY Left ~ 2008   TOTAL KNEE ARTHROPLASTY  12/20/2011   Procedure: TOTAL KNEE ARTHROPLASTY;  Surgeon: Elspeth JONELLE Her, MD;  Location: [facility] OR;  Service: Orthopedics;  Laterality: Right;  Right Total Knee Arthroplasty   TUBAL LIGATION  1980's    Current Facility-Administered Medications  Medication Dose Route Frequency Provider Last Rate Last Admin   0.9 %  sodium chloride  infusion (Manually program via Guardrails IV Fluids)   Intravenous Once Kommor, Lum, MD   Held at 12/27/23 2203   0.9 %  sodium chloride  infusion (Manually program via Guardrails IV Fluids)   Intravenous Once Howerter, Justin B, DO   Held at 12/28/23 0019   acetaminophen  (TYLENOL ) tablet 650 mg  650 mg Oral Q6H PRN Howerter, Justin B, DO       Or   acetaminophen  (TYLENOL ) suppository 650 mg  650 mg Rectal Q6H PRN Howerter, Justin B, DO       Chlorhexidine  Gluconate Cloth 2 % PADS 6 each  6 each Topical Daily Howerter, Justin B, DO   6 each at 12/28/23 0257   iohexol  (OMNIPAQUE ) 300 MG/ML solution 100 mL  100 mL Intra-arterial Once PRN Hughes Simmonds, MD       levalbuterol  (XOPENEX ) nebulizer solution 1.25 mg  1.25 mg Nebulization Q4H PRN Howerter, Justin B, DO       ondansetron  (ZOFRAN ) injection 4 mg  4 mg Intravenous Q6H PRN Howerter, Justin B, DO       Oral care mouth rinse  15 mL Mouth Rinse PRN Howerter, Justin B, DO       pantoprazole  (PROTONIX ) injection 40 mg  40 mg Intravenous Q12H Howerter, Justin B, DO  40 mg at 12/29/23 9088   sodium chloride  flush (NS) 0.9 % injection 3-10 mL  3-10 mL Intravenous Q12H Kriss Estefana DEL, DO   10 mL at 12/29/23 9087   sodium chloride  flush (NS) 0.9 % injection 3-10 mL  3-10 mL Intravenous PRN Kriss Estefana DEL, DO       umeclidinium-vilanterol (ANORO ELLIPTA ) 62.5-25 MCG/ACT 1 puff  1 puff Inhalation q morning Howerter, Justin B, DO   1 puff at 12/29/23 9177   Allergies as of 12/27/2023 - Review Complete 12/27/2023  Allergen Reaction Noted   Morphine and codeine Itching 11/29/2011   Aspirin Other (See Comments) 10/22/2020   Duloxetine  hcl Other (See Comments) 10/22/2020   Tymlos [abaloparatide] Other (See Comments) 10/22/2020   Ventolin  [albuterol ] Other (See Comments) 12/25/2020   Cefadroxil Hives 12/11/2011    Review of Systems:  Review of Systems  Respiratory:  Negative for shortness of breath.   Cardiovascular:  Negative for chest pain.  Gastrointestinal:  Positive for blood in stool. Negative for abdominal pain, nausea and vomiting.    OBJECTIVE:   Temp:  [97.8 F (36.6 C)-98.4 F (36.9 C)] 98 F (36.7 C) (06/30 0800) Pulse Rate:  [89-127] 92 (06/30 0600) Resp:  [16-29] 16 (06/30 0600) BP: (96-145)/(28-77) 128/77 (06/30 0600) SpO2:  [90 %-96 %] 95 % (06/30 0823) Weight:  [76.2 kg] 76.2 kg (06/30 0500) Last BM Date : 12/29/23 (gi bleed) Physical Exam Constitutional:      General: She is not in acute distress.    Appearance: She is not ill-appearing, toxic-appearing or diaphoretic.   Cardiovascular:     Rate and Rhythm: Normal rate and regular rhythm.  Pulmonary:     Effort: No respiratory distress.     Breath sounds: Normal breath sounds.  Abdominal:     General: There is no distension.     Palpations: Abdomen is soft.     Tenderness: There is no abdominal tenderness. There is no guarding.   Musculoskeletal:     Right lower leg: No edema.     Left lower leg: No edema.   Skin:    General: Skin is warm and dry.     Labs: Recent Labs    12/27/23 1130 12/27/23 1139 12/28/23 0411 12/28/23 0843 12/28/23 1249 12/29/23 0247  WBC 5.8  --  8.5  --   --  6.8  HGB 9.4*   < > 8.2* 8.1* 8.0* 7.3*  HCT 31.2*   < > 26.7* 26.1* 25.7* 24.4*  PLT 170  --  121*  --   --  133*   < > = values in this interval not displayed.   BMET Recent Labs    12/27/23 1130 12/27/23 1139 12/28/23 0411 12/29/23 0247  NA 140 142 140 139  K 3.4* 3.1* 3.4* 3.6  CL 103 101 107 109  CO2 28  --  25 23  GLUCOSE 120* 120* 119* 99  BUN 11 9 10 9   CREATININE 0.52 0.60 0.45 0.36*  CALCIUM  8.4*  --  7.7* 8.2*   LFT Recent Labs    12/28/23 0411  PROT 4.9*  ALBUMIN 2.9*  AST 20  ALT 9  ALKPHOS 49  BILITOT 1.9*   PT/INR Recent Labs    12/27/23 1753  LABPROT 15.4*  INR 1.2    Diagnostic imaging: IR EMBO ART  VEN HEMORR LYMPH EXTRAV  INC GUIDE ROADMAPPING Result Date: 12/28/2023 INDICATION: Diverticular bleed.  Bright red blood per rectum. EXAM: Title; MESENTERIC ARTERIOGRAPHY and COIL EMBOLIZATION  for LOWER GI BLEED Listed procedures; 1. ULTRASOUND GUIDANCE for RIGHT COMMON FEMORAL ARTERIAL ACCESS 2. INFERIOR MESENTERIC ARTERY ARTERIOGRAPHY 3. COIL EMBOLIZATION of a DISTAL LEFT COLIC BRANCH VESSEL at iDESCENDING COLON COMPARISON:  CTA AP, 12/27/2023 and 04/19/2023 MEDICATIONS: None ANESTHESIA/SEDATION: Moderate (conscious) sedation was employed during this procedure. A total of Versed  4 mg and Fentanyl  100 mcg was administered intravenously. Moderate Sedation Time: 60 minutes. The patient's level of consciousness and vital signs were monitored continuously by radiology nursing throughout the procedure under my direct supervision. CONTRAST:  45 mL Omnipaque  300 FLUOROSCOPY: Radiation Exposure Index and estimated peak skin dose (PSD); Reference air kerma (RAK), 211 mGy. COMPLICATIONS: None immediate. PROCEDURE: Informed consent was obtained from the patient and/or patient's representative following explanation of the procedure, risks, benefits and alternatives. All questions were addressed. A time out was performed prior to the initiation of the procedure. Maximal barrier sterile technique utilized including caps, mask, sterile gowns, sterile gloves, large sterile drape, hand hygiene, and Betadine prep. The RIGHT femoral head was marked fluoroscopically. Under sterile conditions and local anesthesia, the RIGHT common femoral artery access was performed with a micropuncture needle. Under direct ultrasound guidance, the RIGHT common femoral was accessed with a micropuncture kit. An ultrasound image was saved for documentation purposes. This allowed for placement of a 5 Fr vascular sheath. A limited arteriogram was performed through the side arm of the sheath confirming appropriate  access within the RIGHT common femoral artery. Over a Bentson wire, a Mickelson catheter was advanced, reformed, back bled and flushed. The catheter was then utilized to select the inferior mesenteric artery and a selective inferior mesenteric arteriogram was performed. Using a 2.1 Fr Progreat alpha microcatheter and microwire, access into the distal LEFT colic artery branch vessels was performed and selective arteriography was obtained to identify the bleeding vessel. Super selective coil embolization was then performed using a single non detachable micro coil. A post embolization arteriogram was performed without evidence of residual extravasation. Images were reviewed and the procedure was terminated. All wires, catheters and sheaths were removed from the patient. Hemostasis was achieved at the RIGHT groin access site with Angio-Seal closure device. The patient tolerated the procedure well without immediate post procedural complication. FINDINGS: - access via the RIGHT femoral artery. - No overt extravasation at the descending colon with suspected ooze from IMA territory, distal LEFT colic branch vessel - Successful embolization of the target vessel with a single micro coil - Angio-Seal closure at RIGHT groin. Palpable RLE pulses at the end of the Case IMPRESSION: Successful super-selective coil embolization of a target vessel at site of ooze from IMA territory, LEFT colic branch vessel, as above. PLAN: - The patient is to remain flat for 2 hours with RIGHT leg straight. - The patient may continue to experience several additional bloody bowel movements however should clear the rectal vault in the coming days. Thom Hall, MD [facility] Radiology Electronically Signed   By: Thom Hall M.D.   On: 12/28/2023 11:15   IR Angiogram Visceral Selective Result Date: 12/28/2023 INDICATION: Diverticular bleed.  Bright red blood per rectum. EXAM: Title; MESENTERIC  ARTERIOGRAPHY and COIL EMBOLIZATION for LOWER GI BLEED Listed procedures; 1. ULTRASOUND GUIDANCE for RIGHT COMMON FEMORAL ARTERIAL ACCESS 2. INFERIOR MESENTERIC ARTERY ARTERIOGRAPHY 3. COIL EMBOLIZATION of a DISTAL LEFT COLIC BRANCH VESSEL at iDESCENDING COLON COMPARISON:  CTA AP, 12/27/2023 and 04/19/2023 MEDICATIONS: None ANESTHESIA/SEDATION: Moderate (conscious) sedation was employed during this procedure. A total of Versed  4 mg  and Fentanyl  100 mcg was administered intravenously. Moderate Sedation Time: 60 minutes. The patient's level of consciousness and vital signs were monitored continuously by radiology nursing throughout the procedure under my direct supervision. CONTRAST:  45 mL Omnipaque  300 FLUOROSCOPY: Radiation Exposure Index and estimated peak skin dose (PSD); Reference air kerma (RAK), 211 mGy. COMPLICATIONS: None immediate. PROCEDURE: Informed consent was obtained from the patient and/or patient's representative following explanation of the procedure, risks, benefits and alternatives. All questions were addressed. A time out was performed prior to the initiation of the procedure. Maximal barrier sterile technique utilized including caps, mask, sterile gowns, sterile gloves, large sterile drape, hand hygiene, and Betadine prep. The RIGHT femoral head was marked fluoroscopically. Under sterile conditions and local anesthesia, the RIGHT common femoral artery access was performed with a micropuncture needle. Under direct ultrasound guidance, the RIGHT common femoral was accessed with a micropuncture kit. An ultrasound image was saved for documentation purposes. This allowed for placement of a 5 Fr vascular sheath. A limited arteriogram was performed through the side arm of the sheath confirming appropriate access within the RIGHT common femoral artery. Over a Bentson wire, a Mickelson catheter was advanced, reformed, back bled and flushed. The catheter was then utilized to select the inferior mesenteric  artery and a selective inferior mesenteric arteriogram was performed. Using a 2.1 Fr Progreat alpha microcatheter and microwire, access into the distal LEFT colic artery branch vessels was performed and selective arteriography was obtained to identify the bleeding vessel. Super selective coil embolization was then performed using a single non detachable micro coil. A post embolization arteriogram was performed without evidence of residual extravasation. Images were reviewed and the procedure was terminated. All wires, catheters and sheaths were removed from the patient. Hemostasis was achieved at the RIGHT groin access site with Angio-Seal closure device. The patient tolerated the procedure well without immediate post procedural complication. FINDINGS: - access via the RIGHT femoral artery. - No overt extravasation at the descending colon with suspected ooze from IMA territory, distal LEFT colic branch vessel - Successful embolization of the target vessel with a single micro coil - Angio-Seal closure at RIGHT groin. Palpable RLE pulses at the end of the Case IMPRESSION: Successful super-selective coil embolization of a target vessel at site of ooze from IMA territory, LEFT colic branch vessel, as above. PLAN: - The patient is to remain flat for 2 hours with RIGHT leg straight. - The patient may continue to experience several additional bloody bowel movements however should clear the rectal vault in the coming days. Thom Hall, MD [facility] Radiology Electronically Signed   By: Thom Hall M.D.   On: 12/28/2023 11:15   IR Angiogram Selective Each Additional Vessel Result Date: 12/28/2023 INDICATION: Diverticular bleed.  Bright red blood per rectum. EXAM: Title; MESENTERIC ARTERIOGRAPHY and COIL EMBOLIZATION for LOWER GI BLEED Listed procedures; 1. ULTRASOUND GUIDANCE for RIGHT COMMON FEMORAL ARTERIAL ACCESS 2. INFERIOR MESENTERIC ARTERY ARTERIOGRAPHY 3. COIL  EMBOLIZATION of a DISTAL LEFT COLIC BRANCH VESSEL at iDESCENDING COLON COMPARISON:  CTA AP, 12/27/2023 and 04/19/2023 MEDICATIONS: None ANESTHESIA/SEDATION: Moderate (conscious) sedation was employed during this procedure. A total of Versed  4 mg and Fentanyl  100 mcg was administered intravenously. Moderate Sedation Time: 60 minutes. The patient's level of consciousness and vital signs were monitored continuously by radiology nursing throughout the procedure under my direct supervision. CONTRAST:  45 mL Omnipaque  300 FLUOROSCOPY: Radiation Exposure Index and estimated peak skin dose (PSD); Reference air kerma (RAK), 211 mGy.  COMPLICATIONS: None immediate. PROCEDURE: Informed consent was obtained from the patient and/or patient's representative following explanation of the procedure, risks, benefits and alternatives. All questions were addressed. A time out was performed prior to the initiation of the procedure. Maximal barrier sterile technique utilized including caps, mask, sterile gowns, sterile gloves, large sterile drape, hand hygiene, and Betadine prep. The RIGHT femoral head was marked fluoroscopically. Under sterile conditions and local anesthesia, the RIGHT common femoral artery access was performed with a micropuncture needle. Under direct ultrasound guidance, the RIGHT common femoral was accessed with a micropuncture kit. An ultrasound image was saved for documentation purposes. This allowed for placement of a 5 Fr vascular sheath. A limited arteriogram was performed through the side arm of the sheath confirming appropriate access within the RIGHT common femoral artery. Over a Bentson wire, a Mickelson catheter was advanced, reformed, back bled and flushed. The catheter was then utilized to select the inferior mesenteric artery and a selective inferior mesenteric arteriogram was performed. Using a 2.1 Fr Progreat alpha microcatheter and microwire, access into the distal LEFT colic artery branch vessels was  performed and selective arteriography was obtained to identify the bleeding vessel. Super selective coil embolization was then performed using a single non detachable micro coil. A post embolization arteriogram was performed without evidence of residual extravasation. Images were reviewed and the procedure was terminated. All wires, catheters and sheaths were removed from the patient. Hemostasis was achieved at the RIGHT groin access site with Angio-Seal closure device. The patient tolerated the procedure well without immediate post procedural complication. FINDINGS: - access via the RIGHT femoral artery. - No overt extravasation at the descending colon with suspected ooze from IMA territory, distal LEFT colic branch vessel - Successful embolization of the target vessel with a single micro coil - Angio-Seal closure at RIGHT groin. Palpable RLE pulses at the end of the Case IMPRESSION: Successful super-selective coil embolization of a target vessel at site of ooze from IMA territory, LEFT colic branch vessel, as above. PLAN: - The patient is to remain flat for 2 hours with RIGHT leg straight. - The patient may continue to experience several additional bloody bowel movements however should clear the rectal vault in the coming days. Thom Hall, MD [facility] Radiology Electronically Signed   By: Thom Hall M.D.   On: 12/28/2023 11:15   IR US  Guide Vasc Access Right Result Date: 12/28/2023 INDICATION: Diverticular bleed.  Bright red blood per rectum. EXAM: Title; MESENTERIC ARTERIOGRAPHY and COIL EMBOLIZATION for LOWER GI BLEED Listed procedures; 1. ULTRASOUND GUIDANCE for RIGHT COMMON FEMORAL ARTERIAL ACCESS 2. INFERIOR MESENTERIC ARTERY ARTERIOGRAPHY 3. COIL EMBOLIZATION of a DISTAL LEFT COLIC BRANCH VESSEL at iDESCENDING COLON COMPARISON:  CTA AP, 12/27/2023 and 04/19/2023 MEDICATIONS: None ANESTHESIA/SEDATION: Moderate (conscious) sedation was employed during  this procedure. A total of Versed  4 mg and Fentanyl  100 mcg was administered intravenously. Moderate Sedation Time: 60 minutes. The patient's level of consciousness and vital signs were monitored continuously by radiology nursing throughout the procedure under my direct supervision. CONTRAST:  45 mL Omnipaque  300 FLUOROSCOPY: Radiation Exposure Index and estimated peak skin dose (PSD); Reference air kerma (RAK), 211 mGy. COMPLICATIONS: None immediate. PROCEDURE: Informed consent was obtained from the patient and/or patient's representative following explanation of the procedure, risks, benefits and alternatives. All questions were addressed. A time out was performed prior to the initiation of the procedure. Maximal barrier sterile technique utilized including caps, mask, sterile gowns, sterile gloves, large sterile drape, hand  hygiene, and Betadine prep. The RIGHT femoral head was marked fluoroscopically. Under sterile conditions and local anesthesia, the RIGHT common femoral artery access was performed with a micropuncture needle. Under direct ultrasound guidance, the RIGHT common femoral was accessed with a micropuncture kit. An ultrasound image was saved for documentation purposes. This allowed for placement of a 5 Fr vascular sheath. A limited arteriogram was performed through the side arm of the sheath confirming appropriate access within the RIGHT common femoral artery. Over a Bentson wire, a Mickelson catheter was advanced, reformed, back bled and flushed. The catheter was then utilized to select the inferior mesenteric artery and a selective inferior mesenteric arteriogram was performed. Using a 2.1 Fr Progreat alpha microcatheter and microwire, access into the distal LEFT colic artery branch vessels was performed and selective arteriography was obtained to identify the bleeding vessel. Super selective coil embolization was then performed using a single non detachable micro coil. A post embolization  arteriogram was performed without evidence of residual extravasation. Images were reviewed and the procedure was terminated. All wires, catheters and sheaths were removed from the patient. Hemostasis was achieved at the RIGHT groin access site with Angio-Seal closure device. The patient tolerated the procedure well without immediate post procedural complication. FINDINGS: - access via the RIGHT femoral artery. - No overt extravasation at the descending colon with suspected ooze from IMA territory, distal LEFT colic branch vessel - Successful embolization of the target vessel with a single micro coil - Angio-Seal closure at RIGHT groin. Palpable RLE pulses at the end of the Case IMPRESSION: Successful super-selective coil embolization of a target vessel at site of ooze from IMA territory, LEFT colic branch vessel, as above. PLAN: - The patient is to remain flat for 2 hours with RIGHT leg straight. - The patient may continue to experience several additional bloody bowel movements however should clear the rectal vault in the coming days. Thom Hall, MD [facility] Electronically Signed   By: Thom Hall M.D.   On: 12/28/2023 11:15   CT ANGIO GI BLEED Result Date: 12/27/2023 CLINICAL DATA:  Rectal bleeding.  Left upper abdominal pain. EXAM: CTA ABDOMEN AND PELVIS WITHOUT AND WITH CONTRAST TECHNIQUE: Multidetector CT imaging of the abdomen and pelvis was performed using the standard protocol during bolus administration of intravenous contrast. Multiplanar reconstructed images and MIPs were obtained and reviewed to evaluate the vascular anatomy. RADIATION DOSE REDUCTION: This exam was performed according to the departmental dose-optimization program which includes automated exposure control, adjustment of the mA and/or kV according to patient size and/or use of iterative reconstruction technique. CONTRAST:  OMNIPAQUE  IOHEXOL  350 MG/ML SOLN COMPARISON:   None Available. FINDINGS: VASCULAR Aorta: Atherosclerotic disease in the abdominal aorta without aneurysm or dissection or significant stenosis. Celiac: Patent without evidence of aneurysm, dissection, vasculitis or significant stenosis. SMA: Patent without evidence of aneurysm, dissection, vasculitis or significant stenosis. Renals: Single bilateral renal arteries. Left renal artery is widely patent without aneurysm, dissection or stenosis. At least mild stenosis at origin of the right renal artery without aneurysm or dissection. IMA: Patent without evidence of aneurysm, dissection, vasculitis or significant stenosis. 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 Inflow: Patent without evidence of aneurysm, dissection, vasculitis or significant stenosis. Proximal Outflow: Proximal femoral arteries are patent bilaterally. Veins: Portal venous system is patent. Normal appearance of the IVC and renal veins. Normal appearance of the iliac veins. Review of the MIP images confirms  the above findings. NON-VASCULAR Lower chest: Peripheral reticular densities at the lung bases are suggestive for chronic changes. No pleural effusions. Hepatobiliary: Gallbladder is mildly distended with high-density sludge or small stones. No gallbladder inflammation. Main portal venous system is patent. No biliary dilatation. Pancreas: Unremarkable. No pancreatic ductal dilatation or surrounding inflammatory changes. Spleen: Normal in size without focal abnormality. Adrenals/Urinary Tract: Normal adrenal glands. No kidney stones. Cyst in the left kidney upper pole. No hydronephrosis. No suspicious renal lesion. Normal urinary bladder. Stomach/Bowel: Positive for active GI bleeding involving the descending colon. 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 The area of arterial bleeding in the colon is distal to the  previously embolized area. Extensive diverticular disease throughout the colon. Endoscopic clip in the right colon near the ileocecal valve. Normal appendix. Normal stomach. No bowel dilatation or obstruction. Lymphatic: No lymph node enlargement in the abdomen or pelvis. Reproductive: Uterus and bilateral adnexa are unremarkable. Other: Negative for ascites.  Negative for free air. Musculoskeletal: No acute bone abnormality. Old vertebral body compression fracture at L1. IMPRESSION: 1. Positive for active GI bleeding involving the descending colon. Findings are most compatible with a diverticular bleed. 2. 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 3. Extensive diverticular disease throughout the colon. 4. Gallbladder sludge or small stones. No gallbladder inflammation. 5. Aortic Atherosclerosis (ICD10-I70.0). These results were called by telephone at the time of interpretation on 12/27/2023 at 5:12 pm to provider Dr. Efrain, who verbally acknowledged these results. Electronically Signed   By: Juliene Balder M.D.   On: 12/27/2023 20:33   IMPRESSION: Painless hematochezia, diverticular bleed             -S/p IR embolization 12/27/23 Personal history colon polyps 09/2023 (20 mm ascending removed piecemeal via hot snare) Personal history unspecified colitis on colonoscopy 09/2023   Normocytic, blood loss anemia, stable  PLAN: - Blood per rectum this AM was dark in appearance and small in quantity - No plan for colonoscopy yet - Ok for soft GI diet today  - Trend H/H, transfuse for Hgb < 7  - Monitor bowel movements  - [facility] will follow   LOS: 1 day   Estefana Keas, DO [facility]astroenter[facility]

## 2023-12-30 DIAGNOSIS — K922 Gastrointestinal hemorrhage, unspecified: Secondary | ICD-10-CM | POA: Diagnosis not present

## 2023-12-30 LAB — BASIC METABOLIC PANEL WITH GFR
Anion gap: 7 (ref 5–15)
BUN: 7 mg/dL — ABNORMAL LOW (ref 8–23)
CO2: 23 mmol/L (ref 22–32)
Calcium: 8.1 mg/dL — ABNORMAL LOW (ref 8.9–10.3)
Chloride: 109 mmol/L (ref 98–111)
Creatinine, Ser: 0.4 mg/dL — ABNORMAL LOW (ref 0.44–1.00)
GFR, Estimated: >60 mL/min (ref >60)
Glucose, Bld: 122 mg/dL — ABNORMAL HIGH (ref 70–99)
Potassium: 3.4 mmol/L — ABNORMAL LOW (ref 3.5–5.1)
Sodium: 139 mmol/L (ref 135–145)

## 2023-12-30 LAB — CBC
HCT: 23.8 % — ABNORMAL LOW (ref 36.0–46.0)
Hemoglobin: 7.1 g/dL — ABNORMAL LOW (ref 12.0–15.0)
MCH: 30.2 pg (ref 26.0–34.0)
MCHC: 29.8 g/dL — ABNORMAL LOW (ref 30.0–36.0)
MCV: 101.3 fL — ABNORMAL HIGH (ref 80.0–100.0)
Platelets: 135 10*3/uL — ABNORMAL LOW (ref 150–400)
RBC: 2.35 MIL/uL — ABNORMAL LOW (ref 3.87–5.11)
RDW: 18.6 % — ABNORMAL HIGH (ref 11.5–15.5)
WBC: 4.8 10*3/uL (ref 4.0–10.5)
nRBC: 0 % (ref 0.0–0.2)

## 2023-12-30 LAB — PREPARE RBC (CROSSMATCH)

## 2023-12-30 LAB — MAGNESIUM: Magnesium: 2.3 mg/dL (ref 1.7–2.4)

## 2023-12-30 MED ORDER — SODIUM CHLORIDE 0.9% IV SOLUTION: Freq: Once | INTRAVENOUS | Status: AC

## 2023-12-30 MED ORDER — POTASSIUM CHLORIDE CRYS ER 20 MEQ PO TBCR
40.0000 meq | EXTENDED_RELEASE_TABLET | Freq: Once | ORAL | Status: AC
  Administered 2023-12-30: 40 meq via ORAL
  Filled 2023-12-30: qty 2

## 2023-12-30 NOTE — Plan of Care (Signed)
  Problem: Clinical Measurements: Goal: Respiratory complications will improve Outcome: Progressing Goal: Cardiovascular complication will be avoided Outcome: Progressing   Problem: Activity: Goal: Risk for activity intolerance will decrease Outcome: Progressing   Problem: Nutrition: Goal: Adequate nutrition will be maintained Outcome: Progressing   Problem: Coping: Goal: Level of anxiety will decrease Outcome: Progressing   

## 2023-12-30 NOTE — TOC PASRR Note (Signed)
 30 Day PASRR Note   Patient Details  Name: [name] Date of Birth: 08-15-1940   Transition of Care [facility]) CM/SW Contact:    Jon ONEIDA Anon, RN Phone Number: 12/30/2023, 9:18 AM  To Whom It May Concern:  Please be advised that this patient will require a short-term nursing home stay - anticipated 30 days or less for rehabilitation and strengthening.   The plan is for return home.

## 2023-12-30 NOTE — Progress Notes (Signed)
 [facility] Progress Note  SUBJECTIVE:   Interval history: [name] was seen and evaluated today at bedside. Resting comfortably in bed. Denied abdominal pain, noted that she felt that she may need to have bowel movement. Spoke with nursing, no bowel movement overnight or this AM.   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, left side   Sleeping excessive    Tinnitus of both ears    Past Surgical History:  Procedure Laterality Date   BALLOON DILATION N/A 05/08/2021   Procedure: BALLOON DILATION;  Surgeon: Saintclair Jasper, MD;  Location: [facility] ENDOSCOPY;  Service: Gastroenterology;  Laterality: N/A;   BASAL CELL CARCINOMA EXCISION  2001   off my nose, left side   BIOPSY  05/08/2021   Procedure: BIOPSY;  Surgeon: Saintclair Jasper, MD;  Location: [facility] ENDOSCOPY;  Service: Gastroenterology;;   BIOPSY  04/25/2023   Procedure: BIOPSY;  Surgeon: Dianna Specking, MD;  Location: [facility] ENDOSCOPY;  Service: Gastroenterology;;   BLEPHAROPLASTY Bilateral    BONE BIOPSY  10/26/2023   Procedure: BIOPSY, GI;  Surgeon: Elicia Claw, MD;  Location: [facility] ENDOSCOPY;  Service: Gastroenterology;;   CATARACT EXTRACTION W/ INTRAOCULAR LENS  IMPLANT, BILATERAL Bilateral 1990's   COLONOSCOPY N/A 10/26/2023   Procedure: COLONOSCOPY;  Surgeon: Elicia Claw, MD;  Location: [facility] ENDOSCOPY;  Service: Gastroenterology;  Laterality: N/A;   COLONOSCOPY WITH PROPOFOL  N/A 05/04/2022    Procedure: COLONOSCOPY WITH PROPOFOL ;  Surgeon: Saintclair Jasper, MD;  Location: [facility] ENDOSCOPY;  Service: Gastroenterology;  Laterality: N/A;   COLONOSCOPY WITH PROPOFOL  N/A 04/25/2023   Procedure: COLONOSCOPY WITH PROPOFOL ;  Surgeon: Dianna Specking, MD;  Location: [facility] ENDOSCOPY;  Service: Gastroenterology;  Laterality: N/A;   ENTEROSCOPY N/A 11/20/2023   Procedure: ENTEROSCOPY;  Surgeon: Dianna Specking, MD;  Location: [facility] ENDOSCOPY;  Service: Gastroenterology;  Laterality: N/A;   ESOPHAGOGASTRODUODENOSCOPY N/A 10/26/2023   Procedure: EGD (ESOPHAGOGASTRODUODENOSCOPY);  Surgeon: Elicia Claw, MD;  Location: [facility] ENDOSCOPY;  Service: Gastroenterology;  Laterality: N/A;   ESOPHAGOGASTRODUODENOSCOPY N/A 11/20/2023   Procedure: EGD (ESOPHAGOGASTRODUODENOSCOPY);  Surgeon: Dianna Specking, MD;  Location: [facility] ENDOSCOPY;  Service: Gastroenterology;  Laterality: N/A;   ESOPHAGOGASTRODUODENOSCOPY (EGD) WITH PROPOFOL  N/A 05/08/2021   Procedure: ESOPHAGOGASTRODUODENOSCOPY (EGD) WITH PROPOFOL ;  Surgeon: Saintclair Jasper, MD;  Location: [facility] ENDOSCOPY;  Service: Gastroenterology;  Laterality: N/A;   ESOPHAGOGASTRODUODENOSCOPY (EGD) WITH PROPOFOL  N/A 01/11/2022   Procedure: ESOPHAGOGASTRODUODENOSCOPY (EGD) WITH PROPOFOL ;  Surgeon: Rosalie Kitchens, MD;  Location: [facility] ENDOSCOPY;  Service: Gastroenterology;  Laterality: N/A;   ESOPHAGOGASTRODUODENOSCOPY (EGD) WITH PROPOFOL  N/A 01/06/2023   Procedure: ESOPHAGOGASTRODUODENOSCOPY (EGD) WITH PROPOFOL ;  Surgeon: Rosalie Kitchens, MD;  Location: [facility] ENDOSCOPY;  Service: Gastroenterology;  Laterality: N/A;   ESOPHAGOGASTRODUODENOSCOPY (EGD) WITH PROPOFOL  N/A 04/21/2023   Procedure: ESOPHAGOGASTRODUODENOSCOPY (EGD) WITH PROPOFOL ;  Surgeon: Dianna Specking, MD;  Location: [facility] ENDOSCOPY;  Service: Gastroenterology;  Laterality: N/A;   EYE SURGERY Bilateral    to improve vision  after cataract OR   GIVENS CAPSULE STUDY N/A 04/21/2023   Procedure: GIVENS CAPSULE STUDY;  Surgeon: Dianna Specking, MD;  Location: [facility] ENDOSCOPY;  Service: Gastroenterology;  Laterality: N/A;   GIVENS CAPSULE STUDY N/A 11/18/2023   Procedure: IMAGING PROCEDURE, GI TRACT, INTRALUMINAL, VIA CAPSULE;  Surgeon: Dianna Specking, MD;  Location: [facility] ENDOSCOPY;  Service: Gastroenterology;  Laterality: N/A;   HEMOSTASIS CLIP PLACEMENT  10/26/2023   Procedure: CONTROL OF HEMORRHAGE, GI TRACT, ENDOSCOPIC, BY CLIPPING OR OVERSEWING;  Surgeon: Elicia Claw, MD;  Location: [facility] ENDOSCOPY;  Service: Gastroenterology;;   IR ANGIOGRAM FOLLOW UP STUDY  12/19/2020   IR ANGIOGRAM SELECTIVE EACH ADDITIONAL VESSEL  12/19/2020   IR ANGIOGRAM SELECTIVE EACH ADDITIONAL VESSEL  12/19/2020   IR ANGIOGRAM SELECTIVE EACH ADDITIONAL VESSEL  12/28/2023   IR ANGIOGRAM VISCERAL SELECTIVE  12/19/2020   IR ANGIOGRAM VISCERAL SELECTIVE  12/28/2023   IR EMBO ART  VEN HEMORR LYMPH EXTRAV  INC GUIDE ROADMAPPING  12/19/2020   IR EMBO ART  VEN HEMORR LYMPH EXTRAV  INC GUIDE ROADMAPPING  12/28/2023   IR US  GUIDE VASC ACCESS RIGHT  12/19/2020   IR US  GUIDE VASC ACCESS RIGHT  12/28/2023   JOINT REPLACEMENT     POLYPECTOMY  05/04/2022   Procedure: POLYPECTOMY;  Surgeon: Saintclair Jasper, MD;  Location: [facility] ENDOSCOPY;  Service: Gastroenterology;;   POLYPECTOMY  10/26/2023   Procedure: POLYPECTOMY, INTESTINE;  Surgeon: Elicia Claw, MD;  Location: [facility] ENDOSCOPY;  Service: Gastroenterology;;   STAPEDES SURGERY Left    scraped stapedes because it was sticking when it wasn't suppose to   TONSILLECTOMY AND ADENOIDECTOMY  1946   TOTAL KNEE ARTHROPLASTY Left ~ 2008   TOTAL KNEE ARTHROPLASTY  12/20/2011   Procedure: TOTAL KNEE ARTHROPLASTY;  Surgeon: Elspeth JONELLE Her, MD;  Location: [facility] OR;  Service: Orthopedics;  Laterality: Right;  Right Total Knee Arthroplasty   TUBAL LIGATION  1980's   Current Facility-Administered Medications  Medication Dose Route Frequency Provider Last Rate Last Admin   acetaminophen  (TYLENOL ) tablet 650 mg  650 mg Oral Q6H PRN  Howerter, Justin B, DO       Or   acetaminophen  (TYLENOL ) suppository 650 mg  650 mg Rectal Q6H PRN Howerter, Justin B, DO       Chlorhexidine  Gluconate Cloth 2 % PADS 6 each  6 each Topical Daily Howerter, Justin B, DO   6 each at 12/30/23 0247   dorzolamide -timolol  (COSOPT ) 2-0.5 % ophthalmic solution 1 drop  1 drop Both Eyes BID Pokhrel, Laxman, MD   1 drop at 12/30/23 0932   iohexol  (OMNIPAQUE ) 300 MG/ML solution 100 mL  100 mL Intra-arterial Once PRN Hughes Simmonds, MD       latanoprost  (XALATAN ) 0.005 % ophthalmic solution 1 drop  1 drop Both Eyes QHS Pokhrel, Laxman, MD   1 drop at 12/29/23 2149   levalbuterol  (XOPENEX ) nebulizer solution 1.25 mg  1.25 mg Nebulization Q4H PRN Howerter, Justin B, DO       ondansetron  (ZOFRAN ) injection 4 mg  4 mg Intravenous Q6H PRN Howerter, Justin B, DO       Oral care mouth rinse  15 mL Mouth Rinse PRN Howerter, Justin B, DO       pantoprazole  (PROTONIX ) injection 40 mg  40 mg Intravenous Q12H Howerter, Justin B, DO   40 mg at 12/30/23 0931   sodium chloride  flush (NS) 0.9 % injection 3-10 mL  3-10 mL Intravenous Q12H Kriss Stagger H, DO   10 mL at 12/30/23 [phone]  sodium chloride  flush (NS) 0.9 % injection 3-10 mL  3-10 mL Intravenous PRN Kriss Estefana DEL, DO       umeclidinium-vilanterol (ANORO ELLIPTA ) 62.5-25 MCG/ACT 1 puff  1 puff Inhalation q morning Howerter, Justin B, DO   1 puff at 12/30/23 0925   Allergies as of 12/27/2023 - Review Complete 12/27/2023  Allergen Reaction Noted   Morphine and codeine Itching 11/29/2011   Aspirin Other (See Comments) 10/22/2020   Duloxetine  hcl Other (See Comments) 10/22/2020   Tymlos [abaloparatide] Other (See Comments) 10/22/2020   Ventolin  [albuterol ] Other (See Comments) 12/25/2020   Cefadroxil Hives 12/11/2011   Review of Systems:  Review of Systems  Gastrointestinal:  Negative for abdominal pain, nausea and vomiting.    OBJECTIVE:   Temp:  [97.6 F (36.4 C)-98.5 F (36.9 C)] 98.5 F (36.9 C)  (07/01 0933) Pulse Rate:  [76-108] 98 (07/01 0933) Resp:  [16-22] 19 (07/01 0933) BP: (90-138)/(27-78) 117/45 (07/01 0933) SpO2:  [93 %-97 %] 94 % (07/01 0933) Weight:  [75.9 kg] 75.9 kg (07/01 0306) Last BM Date : 12/29/23 (gi bleed) Physical Exam Constitutional:      General: She is not in acute distress.    Appearance: She is not ill-appearing, toxic-appearing or diaphoretic.   Cardiovascular:     Rate and Rhythm: Normal rate and regular rhythm.  Pulmonary:     Effort: No respiratory distress.     Breath sounds: Normal breath sounds.  Abdominal:     General: There is no distension.     Palpations: Abdomen is soft.     Tenderness: There is no abdominal tenderness. There is no guarding.   Neurological:     Mental Status: She is alert.     Labs: Recent Labs    12/29/23 1314 12/29/23 1723 12/30/23 0324  WBC 7.0 6.7 4.8  HGB 7.7* 7.6* 7.1*  HCT 25.1* 24.8* 23.8*  PLT 152 145* 135*   BMET Recent Labs    12/28/23 0411 12/29/23 0247 12/30/23 0324  NA 140 139 139  K 3.4* 3.6 3.4*  CL 107 109 109  CO2 25 23 23   GLUCOSE 119* 99 122*  BUN 10 9 7*  CREATININE 0.45 0.36* 0.40*  CALCIUM  7.7* 8.2* 8.1*   LFT Recent Labs    12/28/23 0411  PROT 4.9*  ALBUMIN 2.9*  AST 20  ALT 9  ALKPHOS 49  BILITOT 1.9*   PT/INR Recent Labs    12/27/23 1753  LABPROT 15.4*  INR 1.2   Diagnostic imaging: No results found.  IMPRESSION: Painless hematochezia, diverticular bleed             -S/p IR embolization of distal left colic branch vessel in descending colon on 12/27/23 Personal history colon polyps 09/2023 (20 mm ascending removed piecemeal via hot snare) Personal history unspecified colitis on colonoscopy 09/2023   Normocytic, blood loss anemia, stable  PLAN: - Blood per rectum slowing, monitor bowel movements  - Trend H/H, transfuse for Hgb < 7  - No plan for colonoscopy - Diet as tolerated  - Follow up with Dr. Dianna 4-6 weeks after discharge  - [facility]  will sign off and be available should questions arise    LOS: 2 days   Estefana Kriss, [facility]

## 2023-12-30 NOTE — Progress Notes (Signed)
 PROGRESS NOTE  KIARAH ECKSTEIN FMW:[id number] DOB: 1941/01/01 DOA: 12/27/2023 PCP: Gil Greig BRAVO, NP   LOS: 2 days   Brief narrative:  [name] is a 83 y.o. female with past medical history significant for chronic iron  deficiency anemia with baseline hemoglobin 8-10, COPD, presented hospital with bright red blood per rectum treated for himself at home.  Baseline hemoglobin around 8-10.  In the ED, patient was slightly tachypneic.  Labs showed mild hypokalemia with potassium of 3.4.  Hemoglobin was 9.4 followed by 8.3 and 6.4.  CTA GI bleed study showed evidence of active contrast extravasation into the descending colon suggestive of active diverticular bleed.  GI was consulted and subsequently patient was taken for embolization by IR.  Received 1 unit of packed RBC and IV fluid bolus.  Patient was then admitted hospital for further evaluation and treatment.  Assessment/Plan: Principal Problem:   Acute lower GI bleeding Active Problems:   GI bleed   Hyperlipidemia   COPD (chronic obstructive pulmonary disease) (HCC)   Restless leg syndrome   Hypertension   GERD (gastroesophageal reflux disease)   GAD (generalized anxiety disorder)   Chronic heart failure with preserved ejection fraction (HFpEF) (HCC)   Acute on chronic blood loss anemia   Hypokalemia   Acute lower gastrointestinal bleed:  1 day history of new onset hematochezia with 8-9 total episodes.  CTA showed active extravasation in the descending colon.  Patient was taken by IR for embolization.  Patient underwent intervention and subsequently had fever to maroon stools which were thought to be old.  Hemoglobin has slightly drifted down to 7.1 today so we will transfuse 1 more additional unit of PRBC.  GI has seen the patient again today no further GI recommendations.  Tachycardia.  Improved.     Acute blood loss anemia superimposed on chronic iron  deficiency anemia:  baseline hemoglobin 8-10, initial hemoglobin of 6.4, in the  setting of presenting acute lower gastrointestinal bleed.  Received a total of 2 units of packed RBC.  Initially had some black-maroon stools and today had brown stools.  Will continue to monitor.  Mild hypokalemia.  Will continue to replenish orally.  Potassium today 3.4. check levels in am.    COPD: On Anoro Ellipta .  Appears compensated.  Continue nebulizers while in hospital  Debility, deconditioning, weakness.  Patient is from assisted living facility and lives with her husband.   PT OT recommends skilled nursing facility.  DVT prophylaxis: SCDs Start: 12/27/23 2345  Disposition: Skilled nursing facility as per PT evaluation.  Medically stable for disposition  Status is: Inpatient The patient is inpatient because: Status post IR embolization, plan for skilled facility placement.  Code Status:     Code Status: Do not attempt resuscitation (DNR) PRE-ARREST INTERVENTIONS DESIRED  Family Communication:  Spoke with the patient's son on the phone on 12/29/2023  Consultants: GI Interventional radiology PCCM  Procedures: IR guided coil embolization of inferior mesenteric branch artery on 12/27/2023 PRBC transfusion II units  Anti-infectives:  None  Anti-infectives (From admission, onward)    None      Subjective: Today, patient was seen and examined at bedside.  Patient denies any pain, nausea, vomiting, fever or chills.  Has had brown stool today.  No overt bleeding.  No shortness of breath or chest pain  Objective: Vitals:   12/30/23 0918 12/30/23 0933  BP: (!) 119/40 (!) 117/45  Pulse: (!) 101 98  Resp: (!) 22 19  Temp: 97.6 F (36.4 C) 98.5 F (  36.9 C)  SpO2:  94%    Intake/Output Summary (Last 24 hours) at 12/30/2023 1150 Last data filed at 12/30/2023 0452 Gross per 24 hour  Intake 300 ml  Output 1000 ml  Net -700 ml   Filed Weights   12/28/23 0257 12/29/23 0500 12/30/23 0306  Weight: 76.7 kg 76.2 kg 75.9 kg   Body mass index is 31.62 kg/m.   Physical  Exam:  General:  Average built, not in obvious distress, obese build, elderly female, Communicative HENT: Mild pallor noted.  Oral mucosa is moist.  Chest:  Clear breath sounds.  Diminished breath sounds bilaterally. No crackles or wheezes.  CVS: S1 &S2 heard. No murmur.  Regular rate and rhythm. Abdomen: Soft, nontender, nondistended.  Bowel sounds are heard.   Extremities: No cyanosis, clubbing or edema.  Peripheral pulses are palpable. Psych: Alert, awake and oriented, normal mood CNS:  No cranial nerve deficits.  Moves all extremities.  Generalized weakness noted. Skin: Warm and dry.  No rashes noted.  Data Review: I have personally reviewed the following laboratory data and studies,  CBC: Recent Labs  Lab 12/27/23 1130 12/27/23 1139 12/28/23 0411 12/28/23 0843 12/28/23 1249 12/29/23 0247 12/29/23 1314 12/29/23 1723 12/30/23 0324  WBC 5.8  --  8.5  --   --  6.8 7.0 6.7 4.8  NEUTROABS 4.0  --  7.1  --   --   --   --   --   --   HGB 9.4*   < > 8.2*   < > 8.0* 7.3* 7.7* 7.6* 7.1*  HCT 31.2*   < > 26.7*   < > 25.7* 24.4* 25.1* 24.8* 23.8*  MCV 99.7  --  97.4  --   --  98.8 98.4 98.8 101.3*  PLT 170  --  121*  --   --  133* 152 145* 135*   < > = values in this interval not displayed.   Basic Metabolic Panel: Recent Labs  Lab 12/27/23 1130 12/27/23 1139 12/28/23 0411 12/29/23 0247 12/30/23 0324  NA 140 142 140 139 139  K 3.4* 3.1* 3.4* 3.6 3.4*  CL 103 101 107 109 109  CO2 28  --  25 23 23   GLUCOSE 120* 120* 119* 99 122*  BUN 11 9 10 9  7*  CREATININE 0.52 0.60 0.45 0.36* 0.40*  CALCIUM  8.4*  --  7.7* 8.2* 8.1*  MG  --   --  1.9 2.5* 2.3   Liver Function Tests: Recent Labs  Lab 12/27/23 1130 12/28/23 0411  AST 23 20  ALT 8 9  ALKPHOS 65 49  BILITOT 0.8 1.9*  PROT 6.1* 4.9*  ALBUMIN 3.5 2.9*   No results for input(s): LIPASE, AMYLASE in the last 168 hours. No results for input(s): AMMONIA in the last 168 hours. Cardiac Enzymes: No results for  input(s): CKTOTAL, CKMB, CKMBINDEX, TROPONINI in the last 168 hours. BNP (last 3 results) Recent Labs    10/25/23 0510  BNP 46.6    ProBNP (last 3 results) No results for input(s): PROBNP in the last 8760 hours.  CBG: No results for input(s): GLUCAP in the last 168 hours. Recent Results (from the past 240 hours)  MRSA Next Gen by PCR, Nasal     Status: None   Collection Time: 12/28/23  2:57 AM   Specimen: Nasal Mucosa; Nasal Swab  Result Value Ref Range Status   MRSA by PCR Next Gen NOT DETECTED NOT DETECTED Final    Comment: (NOTE) The GeneXpert  MRSA Assay (FDA approved for NASAL specimens only), is one component of a comprehensive MRSA colonization surveillance program. It is not intended to diagnose MRSA infection nor to guide or monitor treatment for MRSA infections. Test performance is not FDA approved in patients less than 68 years old. Performed at [facility], [address], [place], [place] [address]      Studies: No results found.     Fatima Fedie, MD  [facility] 12/30/2023  If 7PM-7AM, please contact night-coverage

## 2023-12-30 NOTE — TOC Progression Note (Signed)
 Transition of Care [facility]) - Progression Note    Patient Details  Name: [name] MRN: [id number] Date of Birth: [birth year]  Transition of Care [facility]) CM/SW Contact  Jon ONEIDA Anon, RN Phone Number: 12/30/2023, 3:21 PM  Clinical Narrative:    FL2 and 30 Day Note sent in to [facility] for Level II Determination. PASRR number received. CSW referral sent over to [facility]. Insurance auth started, awaiting approval. TOC is following.   Expected Discharge Plan: Assisted Living Barriers to Discharge: Continued Medical Work up  Expected Discharge Plan and Services   Discharge Planning Services: CM Consult   Living arrangements for the past 2 months: Assisted Living Facility                                       Social Determinants of Health (SDOH) Interventions SDOH Screenings   Food Insecurity: No Food Insecurity (12/28/2023)  Housing: Low Risk  (12/28/2023)  Transportation Needs: No Transportation Needs (12/28/2023)  Utilities: Not At Risk (12/28/2023)  Alcohol  Screen: Low Risk  (04/04/2023)  Depression (PHQ2-9): Low Risk  (10/24/2023)  Financial Resource Strain: Low Risk  (04/04/2023)  Physical Activity: Insufficiently Active (04/04/2023)  Social Connections: Socially Integrated (12/28/2023)  Stress: No Stress Concern Present (04/04/2023)  Tobacco Use: Medium Risk (12/28/2023)  Health Literacy: Adequate Health Literacy (04/04/2023)    Readmission Risk Interventions    11/19/2023   11:31 AM 04/29/2023   11:10 AM 04/21/2023    2:05 PM  Readmission Risk Prevention Plan  Transportation Screening Complete Complete Complete  PCP or Specialist Appt within 5-7 Days Complete    PCP or Specialist Appt within 3-5 Days  Complete Complete  Home Care Screening Complete    Medication Review (RN CM) Complete    HRI or Home Care Consult  Complete Complete  Social Work Consult for Recovery Care Planning/Counseling  Complete Complete  Palliative Care Screening  Not Applicable  Not Applicable  Medication Review Oceanographer)  Complete Complete

## 2023-12-30 NOTE — NC FL2 (Signed)
 [place]  MEDICAID [id number] LEVEL OF CARE FORM     IDENTIFICATION  Patient Name: [name] Birthdate: [birth year] Sex: female Admission Date (Current Location): 12/27/2023  [facility] and [place] Number:      Facility and Address:  [facility],  [address], [place] [address]      Provider Number: [id number]  Attending Physician Name and Address:  Sonjia Held, MD  Relative Name and Phone Number:  Rice MD, [name] Sharper- son [phone]    Current Level of Care: Hospital Recommended Level of Care: Skilled Nursing Facility Prior Approval Number:    Date Approved/Denied:   PASRR Number: [id number]  Discharge Plan: SNF    Current Diagnoses: Patient Active Problem List   Diagnosis Date Noted   Hypokalemia 12/28/2023   Acute on chronic blood loss anemia 12/27/2023   Acute lower GI bleeding 12/27/2023   Closed compression fracture of body of L1 vertebra (HCC) 12/09/2023   Urinary incontinence 12/05/2023   Acute GI bleeding 11/18/2023   Ischemic colitis (HCC) 10/27/2023   Acute upper GI bleed 10/25/2023   Edema of right lower extremity 10/25/2023   Chronic heart failure with preserved ejection fraction (HFpEF) (HCC) 10/25/2023   UGI bleed 04/19/2023   Recurrent depression (HCC) 04/13/2023   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   History of GI diverticular bleed 03/28/2023   GAD (generalized anxiety disorder) 03/28/2023   Diastolic heart failure (HCC) 03/19/2023   Class 1 obesity 03/19/2023   Thoracic aorta atherosclerosis (HCC) 03/19/2023   Melena 01/05/2023   Hypotension 05/02/2022   UTI (urinary tract infection) 02/05/2022   Restless leg syndrome 02/05/2022   Senile osteoporosis 02/05/2022   Hypertension 02/05/2022   Pulmonary nodule 1 cm or greater in diameter 02/05/2022   GERD (gastroesophageal reflux disease) 02/05/2022   Upper GI bleed 01/07/2022   Iron  deficiency anemia 05/02/2021   Chronic anemia    DOE (dyspnea on  exertion) 11/18/2017   COPD (chronic obstructive pulmonary disease) (HCC) 11/18/2017   Diverticulosis of intestine with bleeding 09/05/2017   Acute respiratory failure with hypoxia (HCC) 08/07/2017   Depression with anxiety 08/07/2017   Hyperlipidemia 08/07/2017   Macular degeneration 08/07/2017   Glaucoma 08/07/2017   Weakness 08/07/2017   Dehydration 08/07/2017   Lower GI bleeding 10/14/2016   Diverticulosis of colon 10/14/2016   Anxiety 10/14/2016   Lower GI bleed 10/14/2016   GI bleed    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   Dermatochalasis of both upper eyelids 08/17/2014   Osteoarthritis, multiple sites 12/20/2011    Orientation RESPIRATION BLADDER Height & Weight     Self, Place, Situation  Normal Incontinent Weight: 75.9 kg Height:  5' 1 (154.9 cm)  BEHAVIORAL SYMPTOMS/MOOD NEUROLOGICAL BOWEL NUTRITION STATUS      Continent Diet  AMBULATORY STATUS COMMUNICATION OF NEEDS Skin   Limited Assist Verbally Normal                       Personal Care Assistance Level of Assistance  Bathing, Feeding, Dressing Bathing Assistance: Limited assistance Feeding assistance: Independent Dressing Assistance: Limited assistance     Functional Limitations Info  Sight, Speech, Hearing Sight Info: Impaired Hearing Info: Impaired Speech Info: Adequate    SPECIAL CARE FACTORS FREQUENCY  PT (By licensed PT), OT (By licensed OT)     PT Frequency: 5xwk OT Frequency: 5xwk            Contractures Contractures Info: Not present    Additional  Factors Info  Code Status, Allergies Code Status Info: DNR Allergies Info: Morphine And Codeine, Aspirin, Duloxetine  Hcl, Tymlos (abaloparatide), Ventolin  (albuterol ), Cefadroxil           Current Medications (12/30/2023):  This is the current hospital active medication list Current Facility-Administered Medications  Medication Dose Route Frequency Provider Last Rate Last Admin   acetaminophen  (TYLENOL ) tablet  650 mg  650 mg Oral Q6H PRN Howerter, Justin B, DO       Or   acetaminophen  (TYLENOL ) suppository 650 mg  650 mg Rectal Q6H PRN Howerter, Justin B, DO       Chlorhexidine  Gluconate Cloth 2 % PADS 6 each  6 each Topical Daily Howerter, Justin B, DO   6 each at 12/30/23 0247   dorzolamide -timolol  (COSOPT ) 2-0.5 % ophthalmic solution 1 drop  1 drop Both Eyes BID Pokhrel, Laxman, MD   1 drop at 12/30/23 0932   iohexol  (OMNIPAQUE ) 300 MG/ML solution 100 mL  100 mL Intra-arterial Once PRN Hughes Simmonds, MD       latanoprost  (XALATAN ) 0.005 % ophthalmic solution 1 drop  1 drop Both Eyes QHS Pokhrel, Laxman, MD   1 drop at 12/29/23 2149   levalbuterol  (XOPENEX ) nebulizer solution 1.25 mg  1.25 mg Nebulization Q4H PRN Howerter, Justin B, DO       ondansetron  (ZOFRAN ) injection 4 mg  4 mg Intravenous Q6H PRN Howerter, Justin B, DO       Oral care mouth rinse  15 mL Mouth Rinse PRN Howerter, Justin B, DO       pantoprazole  (PROTONIX ) injection 40 mg  40 mg Intravenous Q12H Howerter, Justin B, DO   40 mg at 12/30/23 0931   sodium chloride  flush (NS) 0.9 % injection 3-10 mL  3-10 mL Intravenous Q12H Kriss Stagger H, DO   10 mL at 12/30/23 9062   sodium chloride  flush (NS) 0.9 % injection 3-10 mL  3-10 mL Intravenous PRN Kriss Stagger DEL, DO       umeclidinium-vilanterol (ANORO ELLIPTA ) 62.5-25 MCG/ACT 1 puff  1 puff Inhalation q morning Howerter, Justin B, DO   1 puff at 12/30/23 9074     Discharge Medications: Please see discharge summary for a list of discharge medications.  Relevant Imaging Results:  Relevant Lab Results:   Additional Information (P) [id number]  Jon ONEIDA Anon, RN

## 2023-12-30 NOTE — NC FL2 (Signed)
 [place]  MEDICAID FL2 LEVEL OF CARE FORM     IDENTIFICATION  Patient Name: [name] Birthdate: Nov 13, 1940 Sex: female Admission Date (Current Location): 12/27/2023  [facility] and [place] Number:      Facility and Address:  [facility],  [address], [place] [address]      Provider Number: [id number]  Attending Physician Name and Address:  Sonjia Held, MD  Relative Name and Phone Number:  Amador MD, [name] Sharper- son [phone]    Current Level of Care: Hospital Recommended Level of Care: Skilled Nursing Facility Prior Approval Number:    Date Approved/Denied:   PASRR Number: Pending  Discharge Plan: SNF    Current Diagnoses: Patient Active Problem List   Diagnosis Date Noted   Hypokalemia 12/28/2023   Acute on chronic blood loss anemia 12/27/2023   Acute lower GI bleeding 12/27/2023   Closed compression fracture of body of L1 vertebra (HCC) 12/09/2023   Urinary incontinence 12/05/2023   Acute GI bleeding 11/18/2023   Ischemic colitis (HCC) 10/27/2023   Acute upper GI bleed 10/25/2023   Edema of right lower extremity 10/25/2023   Chronic heart failure with preserved ejection fraction (HFpEF) (HCC) 10/25/2023   UGI bleed 04/19/2023   Recurrent depression (HCC) 04/13/2023   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   History of GI diverticular bleed 03/28/2023   GAD (generalized anxiety disorder) 03/28/2023   Diastolic heart failure (HCC) 03/19/2023   Class 1 obesity 03/19/2023   Thoracic aorta atherosclerosis (HCC) 03/19/2023   Melena 01/05/2023   Hypotension 05/02/2022   UTI (urinary tract infection) 02/05/2022   Restless leg syndrome 02/05/2022   Senile osteoporosis 02/05/2022   Hypertension 02/05/2022   Pulmonary nodule 1 cm or greater in diameter 02/05/2022   GERD (gastroesophageal reflux disease) 02/05/2022   Upper GI bleed 01/07/2022   Iron  deficiency anemia 05/02/2021   Chronic anemia    DOE (dyspnea on  exertion) 11/18/2017   COPD (chronic obstructive pulmonary disease) (HCC) 11/18/2017   Diverticulosis of intestine with bleeding 09/05/2017   Acute respiratory failure with hypoxia (HCC) 08/07/2017   Depression with anxiety 08/07/2017   Hyperlipidemia 08/07/2017   Macular degeneration 08/07/2017   Glaucoma 08/07/2017   Weakness 08/07/2017   Dehydration 08/07/2017   Lower GI bleeding 10/14/2016   Diverticulosis of colon 10/14/2016   Anxiety 10/14/2016   Lower GI bleed 10/14/2016   GI bleed    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   Dermatochalasis of both upper eyelids 08/17/2014   Osteoarthritis, multiple sites 12/20/2011    Orientation RESPIRATION BLADDER Height & Weight     Self, Place, Situation  Normal Incontinent Weight: 75.9 kg Height:  5' 1 (154.9 cm)  BEHAVIORAL SYMPTOMS/MOOD NEUROLOGICAL BOWEL NUTRITION STATUS      Continent Diet  AMBULATORY STATUS COMMUNICATION OF NEEDS Skin   Limited Assist Verbally Normal                       Personal Care Assistance Level of Assistance  Bathing, Feeding, Dressing Bathing Assistance: Limited assistance Feeding assistance: Independent Dressing Assistance: Limited assistance     Functional Limitations Info  Sight, Speech, Hearing Sight Info: Impaired Hearing Info: Impaired Speech Info: Adequate    SPECIAL CARE FACTORS FREQUENCY  PT (By licensed PT), OT (By licensed OT)     PT Frequency: 5xwk OT Frequency: 5xwk            Contractures Contractures Info: Not present    Additional  Factors Info  Code Status, Allergies Code Status Info: DNR Allergies Info: Morphine And Codeine, Aspirin, Duloxetine  Hcl, Tymlos (abaloparatide), Ventolin  (albuterol ), Cefadroxil           Current Medications (12/30/2023):  This is the current hospital active medication list Current Facility-Administered Medications  Medication Dose Route Frequency Provider Last Rate Last Admin   acetaminophen  (TYLENOL ) tablet  650 mg  650 mg Oral Q6H PRN Howerter, Justin B, DO       Or   acetaminophen  (TYLENOL ) suppository 650 mg  650 mg Rectal Q6H PRN Howerter, Justin B, DO       Chlorhexidine  Gluconate Cloth 2 % PADS 6 each  6 each Topical Daily Howerter, Justin B, DO   6 each at 12/30/23 0247   dorzolamide -timolol  (COSOPT ) 2-0.5 % ophthalmic solution 1 drop  1 drop Both Eyes BID Pokhrel, Laxman, MD   1 drop at 12/29/23 1509   iohexol  (OMNIPAQUE ) 300 MG/ML solution 100 mL  100 mL Intra-arterial Once PRN Hughes Simmonds, MD       latanoprost  (XALATAN ) 0.005 % ophthalmic solution 1 drop  1 drop Both Eyes QHS Pokhrel, Laxman, MD   1 drop at 12/29/23 2149   levalbuterol  (XOPENEX ) nebulizer solution 1.25 mg  1.25 mg Nebulization Q4H PRN Howerter, Justin B, DO       ondansetron  (ZOFRAN ) injection 4 mg  4 mg Intravenous Q6H PRN Howerter, Justin B, DO       Oral care mouth rinse  15 mL Mouth Rinse PRN Howerter, Justin B, DO       pantoprazole  (PROTONIX ) injection 40 mg  40 mg Intravenous Q12H Howerter, Justin B, DO   40 mg at 12/29/23 2149   potassium chloride  SA (KLOR-CON  M) CR tablet 40 mEq  40 mEq Oral Once Pokhrel, Laxman, MD       sodium chloride  flush (NS) 0.9 % injection 3-10 mL  3-10 mL Intravenous Q12H Kriss Stagger H, DO   10 mL at 12/29/23 2149   sodium chloride  flush (NS) 0.9 % injection 3-10 mL  3-10 mL Intravenous PRN Kriss Stagger DEL, DO       umeclidinium-vilanterol (ANORO ELLIPTA ) 62.5-25 MCG/ACT 1 puff  1 puff Inhalation q morning Howerter, Justin B, DO   1 puff at 12/29/23 0822     Discharge Medications: Please see discharge summary for a list of discharge medications.  Relevant Imaging Results:  Relevant Lab Results:   Additional Information (P) [id number]  Jon ONEIDA Anon, RN

## 2023-12-30 NOTE — Plan of Care (Signed)

## 2023-12-31 DIAGNOSIS — K922 Gastrointestinal hemorrhage, unspecified: Secondary | ICD-10-CM | POA: Diagnosis not present

## 2023-12-31 LAB — TYPE AND SCREEN
ABO/RH(D): O POS
Antibody Screen: POSITIVE
Donor AG Type: NEGATIVE
Donor AG Type: NEGATIVE
Donor AG Type: NEGATIVE
Unit division: 0
Unit division: 0
Unit division: 0

## 2023-12-31 LAB — CBC
HCT: 27.9 % — ABNORMAL LOW (ref 36.0–46.0)
Hemoglobin: 8.5 g/dL — ABNORMAL LOW (ref 12.0–15.0)
MCH: 29.9 pg (ref 26.0–34.0)
MCHC: 30.5 g/dL (ref 30.0–36.0)
MCV: 98.2 fL (ref 80.0–100.0)
Platelets: 160 10*3/uL (ref 150–400)
RBC: 2.84 MIL/uL — ABNORMAL LOW (ref 3.87–5.11)
RDW: 17.4 % — ABNORMAL HIGH (ref 11.5–15.5)
WBC: 4.5 10*3/uL (ref 4.0–10.5)
nRBC: 0 % (ref 0.0–0.2)

## 2023-12-31 LAB — BASIC METABOLIC PANEL WITH GFR
Anion gap: 5 (ref 5–15)
BUN: <5 mg/dL — ABNORMAL LOW (ref 8–23)
CO2: 24 mmol/L (ref 22–32)
Calcium: 8.4 mg/dL — ABNORMAL LOW (ref 8.9–10.3)
Chloride: 111 mmol/L (ref 98–111)
Creatinine, Ser: 0.34 mg/dL — ABNORMAL LOW (ref 0.44–1.00)
GFR, Estimated: >60 mL/min (ref >60)
Glucose, Bld: 118 mg/dL — ABNORMAL HIGH (ref 70–99)
Potassium: 3.7 mmol/L (ref 3.5–5.1)
Sodium: 140 mmol/L (ref 135–145)

## 2023-12-31 LAB — BPAM RBC
Blood Product Expiration Date: 202508032359
Blood Product Expiration Date: 202507062359
Blood Product Unit Number: 202508032359
ISSUE DATE / TIME: 202506290001
ISSUE DATE / TIME: 202506301839
PRODUCT CODE: 202507010856
PRODUCT CODE: 202508032359
Unit Type and Rh: 5100
Unit Type and Rh: 202508032359
Unit Type and Rh: 5100
Unit Type and Rh: 5100
Unit Type and Rh: 5100

## 2023-12-31 NOTE — Plan of Care (Signed)
   Problem: Education: Goal: Knowledge of General Education information will improve Description: Including pain rating scale, medication(s)/side effects and non-pharmacologic comfort measures Outcome: Progressing   Problem: Activity: Goal: Risk for activity intolerance will decrease Outcome: Progressing   Problem: Coping: Goal: Level of anxiety will decrease Outcome: Progressing   Problem: Pain Managment: Goal: General experience of comfort will improve and/or be controlled Outcome: Progressing   Problem: Safety: Goal: Ability to remain free from injury will improve Outcome: Progressing

## 2023-12-31 NOTE — Discharge Summary (Signed)
 Physician Discharge Summary   Patient: [name] MRN: [id number] DOB: 1941-02-04  Admit date:     12/27/2023  Discharge date: 12/31/23  Discharge Physician: Delon Herald   PCP: Gil Greig BRAVO, NP   Recommendations at discharge:   You are being discharged to home (ALF) with home health physical and occupational therapy Continue to monitor for recurrent bleeding as well as shortness of breath/dizziness with standing/walking Follow up with NP [name] in 1-2 weeks Follow up with Dr. Dianna in 4-6 weeks  Discharge Diagnoses: Principal Problem:   Acute lower GI bleeding Active Problems:   Hyperlipidemia   COPD (chronic obstructive pulmonary disease) (HCC)   Restless leg syndrome   Hypertension   GERD (gastroesophageal reflux disease)   GAD (generalized anxiety disorder)   Chronic heart failure with preserved ejection fraction (HFpEF) (HCC)   Acute on chronic blood loss anemia   Hypokalemia    Hospital Course: 83yo with h/o IDA and COPD who presented on 6/28 with BRBPR.  CTA with active bleeding in descending colon c/w acute diverticular bleeding.  GI was consulted and IR performed embolization.  S/p 2 units PRBC.   Assessment and Plan:  Acute lower gastrointestinal bleeding from acute diverticular source 1 day history of new onset hematochezia with 8-9 total episodes CTA showed active extravasation in the descending colon Underwent successful IR embolization Transfused 2 units PRBC Hgb up to 8.5 and no further reports of bleeding GI consulted; patient will need f/u with Dr. Dianna in 4-6 weeks Continue BID Protonix    Acute blood loss anemia superimposed on chronic iron  deficiency anemia Baseline hemoglobin 8-10 Initial hemoglobin of 6.4 in the setting of acute lower gastrointestinal bleed Received a total of 2 units of packed RBC Continue Fergon   COPD Continue Anoro Ellipta , Albuterol    Appears compensated  Chronic HFpEF Continue torsemide  (resume on  7/3)  HLD Continue simvastatin   Depression  Continue sertraline    Debility, deconditioning, weakness Patient is from assisted living facility and lives with her husband PT/OT initially recommended skilled nursing facility but now says that patient may return to ALF with [facility] therapy if desired After discussion with husband, patient will dc to ALF today with HH therapies  RLS Continue ropinirole   Glaucoma Continue dorzolamide -timolol , latanoprost , Rhopressa   Class 1 Obesity Body mass index is 31.62 kg/m. Weight loss should be encouraged Outpatient PCP/bariatric medicine f/u encouraged Significantly low or high BMI is associated with higher medical risk including morbidity and mortality   DNR DNR confirmed at the time of admission [facility] documents reviewed Patient will need a gold out of facility DNR form at the time of discharge      Consultants: PCCM GI IR PT OT TOC team  Procedures: Mesenteric angiography coil embolization 6/29  Antibiotics: None      Pain control - [place]  Controlled Substance Reporting System database was reviewed. and patient was instructed, not to drive, operate heavy machinery, perform activities at heights, swimming or participation in water activities or provide baby-sitting services while on Pain, Sleep and Anxiety Medications; until their outpatient Physician has advised to do so again. Also recommended to not to take more than prescribed Pain, Sleep and Anxiety Medications.    Disposition: Assisted living Diet recommendation:  Regular diet DISCHARGE MEDICATION: Allergies as of 12/31/2023       Reactions   Morphine And Codeine Itching   Aspirin Other (See Comments)   Unknown reaction   Duloxetine  Hcl Other (See Comments)   Unknown reaction   Tymlos [  abaloparatide] Other (See Comments)   Unknown reaction   Ventolin  [albuterol ] Other (See Comments)   rapid heart beat   Cefadroxil Hives   Patient can take amoxicillin   and cipro         Medication List     STOP taking these medications    docusate sodium  100 MG capsule Commonly known as: COLACE   potassium chloride  SA 20 MEQ tablet Commonly known as: KLOR-CON  M       TAKE these medications    acetaminophen  650 MG CR tablet Commonly known as: TYLENOL  Take 1,300 mg by mouth in the morning and at bedtime.   albuterol  108 (90 Base) MCG/ACT inhaler Commonly known as: VENTOLIN  HFA Inhale 2 puffs into the lungs every 4 (four) hours as needed for wheezing or shortness of breath.   alendronate 70 MG tablet Commonly known as: FOSAMAX Take 70 mg by mouth every Sunday. Take with a full glass of water on an empty stomach.   Align 4 MG Caps Take 4 mg by mouth at bedtime.   Anoro Ellipta  62.5-25 MCG/ACT Aepb Generic drug: umeclidinium-vilanterol Inhale 1 puff into the lungs every morning.   cetirizine  5 MG chewable tablet Commonly known as: ZYRTEC  Chew 5 mg by mouth daily as needed for allergies.   cyanocobalamin  1000 MCG tablet Commonly known as: VITAMIN B12 Take 1,000 mcg by mouth daily.   Dorzolamide  HCl-Timolol  Mal PF 2-0.5 % Soln Place 1 drop into both eyes in the morning and at bedtime.   ferrous gluconate  240 (27 FE) MG tablet Commonly known as: FERGON Take 240 mg by mouth every morning. Take without food   latanoprost  0.005 % ophthalmic solution Commonly known as: XALATAN  Place 1 drop into both eyes at bedtime.   oxybutynin  10 MG 24 hr tablet Commonly known as: DITROPAN -XL Take 10 mg by mouth at bedtime.   pantoprazole  40 MG tablet Commonly known as: PROTONIX  Take 1 tablet (40 mg total) by mouth 2 (two) times daily before a meal.   potassium chloride  20 MEQ/15ML (10%) Soln Take 15 mLs by mouth daily.   PreserVision AREDS 2 Caps Take 1 capsule by mouth 2 (two) times daily.   PreviDent 5000 Booster Plus 1.1 % Pste Generic drug: Sodium Fluoride  Place 1 Application onto teeth See admin instructions. Brush on teeth with  a toothbrush after evening mouth care. Spit out excess and do not rinse.   Rhopressa  0.02 % Soln Generic drug: Netarsudil  Dimesylate Place 1 drop into both eyes at bedtime.   rOPINIRole  0.5 MG tablet Commonly known as: REQUIP  Take 1 tablet (0.5 mg total) by mouth daily as needed.   senna 8.6 MG Tabs tablet Commonly known as: SENOKOT Take 2 tablets by mouth at bedtime.   sertraline  50 MG tablet Commonly known as: ZOLOFT  Take 1.5 tablets (75 mg total) by mouth daily.   simvastatin  20 MG tablet Commonly known as: ZOCOR  Take 20 mg by mouth every evening.   torsemide  20 MG tablet Commonly known as: DEMADEX  Take 20 mg by mouth daily.   traMADol  HCl 25 MG Tabs Take 25 mg by mouth 2 (two) times daily.   vitamin C  1000 MG tablet Take 1,000 mg by mouth every morning.   Vitamin D3 50 MCG (2000 UT) Tabs Take 2,000 Units by mouth every morning.        Discharge Exam:    Subjective: Patient reports some L leg pain that happened after PT. That said, she was able to ambulate 120 feet today with a  rolling walker and PT has changed the recommendation to ALF with HHPT.  I spoke with her husband,  I was unable to reach her son (a Careers adviser).  Her husband is delighted to have her home today (via ambulance).   Objective: Vitals:   12/31/23 0907 12/31/23 1237  BP:  (!) 113/47  Pulse:  83  Resp:  (!) 21  Temp:  98 F (36.7 C)  SpO2: 96% 97%    Intake/Output Summary (Last 24 hours) at 12/31/2023 1536 Last data filed at 12/31/2023 0900 Gross per 24 hour  Intake 240 ml  Output --  Net 240 ml   Filed Weights   12/28/23 0257 12/29/23 0500 12/30/23 0306  Weight: 76.7 kg 76.2 kg 75.9 kg    Exam:  General:  Appears calm and comfortable and is in NAD Eyes:   normal lids, iris ENT:   hard of hearing, grossly normal lips & tongue, mmm Neck:  no LAD, masses or thyromegaly Cardiovascular:  RRR, no m/r/g. No LE edema.  Respiratory:   CTA bilaterally with no wheezes/rales/rhonchi.   Normal respiratory effort. Abdomen:  soft, NT, ND Skin:  no rash or induration seen on limited exam Musculoskeletal:  grossly normal tone BUE/BLE, good ROM, no bony abnormality Psychiatric:  grossly normal mood and affect, speech fluent and appropriate, AOx3 Neurologic:  CN 2-12 grossly intact, moves all extremities in coordinated fashion  Data Reviewed: I have reviewed the patient's lab results since admission.  Pertinent labs for today include:  Glucose 118 WBC 4.5 Hgb 8.5    Condition at discharge: improving  The results of significant diagnostics from this hospitalization (including imaging, microbiology, ancillary and laboratory) are listed below for reference.   Imaging Studies: IR EMBO ART  VEN HEMORR LYMPH EXTRAV  INC GUIDE ROADMAPPING Result Date: 12/28/2023 INDICATION: Diverticular bleed.  Bright red blood per rectum. EXAM: Title; MESENTERIC ARTERIOGRAPHY and COIL EMBOLIZATION for LOWER GI BLEED Listed procedures; 1. ULTRASOUND GUIDANCE for RIGHT COMMON FEMORAL ARTERIAL ACCESS 2. INFERIOR MESENTERIC ARTERY ARTERIOGRAPHY 3. COIL EMBOLIZATION of a DISTAL LEFT COLIC BRANCH VESSEL at iDESCENDING COLON COMPARISON:  CTA AP, 12/27/2023 and 04/19/2023 MEDICATIONS: None ANESTHESIA/SEDATION: Moderate (conscious) sedation was employed during this procedure. A total of Versed  4 mg and Fentanyl  100 mcg was administered intravenously. Moderate Sedation Time: 60 minutes. The patient's level of consciousness and vital signs were monitored continuously by radiology nursing throughout the procedure under my direct supervision. CONTRAST:  45 mL Omnipaque  300 FLUOROSCOPY: Radiation Exposure Index and estimated peak skin dose (PSD); Reference air kerma (RAK), 211 mGy. COMPLICATIONS: None immediate. PROCEDURE: Informed consent was obtained from the patient and/or patient's representative following explanation of the procedure, risks, benefits and alternatives. All questions were addressed. A time out was  performed prior to the initiation of the procedure. Maximal barrier sterile technique utilized including caps, mask, sterile gowns, sterile gloves, large sterile drape, hand hygiene, and Betadine prep. The RIGHT femoral head was marked fluoroscopically. Under sterile conditions and local anesthesia, the RIGHT common femoral artery access was performed with a micropuncture needle. Under direct ultrasound guidance, the RIGHT common femoral was accessed with a micropuncture kit. An ultrasound image was saved for documentation purposes. This allowed for placement of a 5 Fr vascular sheath. A limited arteriogram was performed through the side arm of the sheath confirming appropriate access within the RIGHT common femoral artery. Over a Bentson wire, a Mickelson catheter was advanced, reformed, back bled and flushed. The catheter was then utilized to select the inferior mesenteric  artery and a selective inferior mesenteric arteriogram was performed. Using a 2.1 Fr Progreat alpha microcatheter and microwire, access into the distal LEFT colic artery branch vessels was performed and selective arteriography was obtained to identify the bleeding vessel. Super selective coil embolization was then performed using a single non detachable micro coil. A post embolization arteriogram was performed without evidence of residual extravasation. Images were reviewed and the procedure was terminated. All wires, catheters and sheaths were removed from the patient. Hemostasis was achieved at the RIGHT groin access site with Angio-Seal closure device. The patient tolerated the procedure well without immediate post procedural complication. FINDINGS: - access via the RIGHT femoral artery. - No overt extravasation at the descending colon with suspected ooze from IMA territory, distal LEFT colic branch vessel - Successful embolization of the target vessel with a single micro coil - Angio-Seal closure at RIGHT groin. Palpable RLE pulses at the  end of the Case IMPRESSION: Successful super-selective coil embolization of a target vessel at site of ooze from IMA territory, LEFT colic branch vessel, as above. PLAN: - The patient is to remain flat for 2 hours with RIGHT leg straight. - The patient may continue to experience several additional bloody bowel movements however should clear the rectal vault in the coming days. Thom Hall, MD [facility] Radiology Electronically Signed   By: Thom Hall M.D.   On: 12/28/2023 11:15   IR Angiogram Visceral Selective Result Date: 12/28/2023 INDICATION: Diverticular bleed.  Bright red blood per rectum. EXAM: Title; MESENTERIC ARTERIOGRAPHY and COIL EMBOLIZATION for LOWER GI BLEED Listed procedures; 1. ULTRASOUND GUIDANCE for RIGHT COMMON FEMORAL ARTERIAL ACCESS 2. INFERIOR MESENTERIC ARTERY ARTERIOGRAPHY 3. COIL EMBOLIZATION of a DISTAL LEFT COLIC BRANCH VESSEL at iDESCENDING COLON COMPARISON:  CTA AP, 12/27/2023 and 04/19/2023 MEDICATIONS: None ANESTHESIA/SEDATION: Moderate (conscious) sedation was employed during this procedure. A total of Versed  4 mg and Fentanyl  100 mcg was administered intravenously. Moderate Sedation Time: 60 minutes. The patient's level of consciousness and vital signs were monitored continuously by radiology nursing throughout the procedure under my direct supervision. CONTRAST:  45 mL Omnipaque  300 FLUOROSCOPY: Radiation Exposure Index and estimated peak skin dose (PSD); Reference air kerma (RAK), 211 mGy. COMPLICATIONS: None immediate. PROCEDURE: Informed consent was obtained from the patient and/or patient's representative following explanation of the procedure, risks, benefits and alternatives. All questions were addressed. A time out was performed prior to the initiation of the procedure. Maximal barrier sterile technique utilized including caps, mask, sterile gowns, sterile gloves, large sterile drape, hand hygiene, and Betadine prep.  The RIGHT femoral head was marked fluoroscopically. Under sterile conditions and local anesthesia, the RIGHT common femoral artery access was performed with a micropuncture needle. Under direct ultrasound guidance, the RIGHT common femoral was accessed with a micropuncture kit. An ultrasound image was saved for documentation purposes. This allowed for placement of a 5 Fr vascular sheath. A limited arteriogram was performed through the side arm of the sheath confirming appropriate access within the RIGHT common femoral artery. Over a Bentson wire, a Mickelson catheter was advanced, reformed, back bled and flushed. The catheter was then utilized to select the inferior mesenteric artery and a selective inferior mesenteric arteriogram was performed. Using a 2.1 Fr Progreat alpha microcatheter and microwire, access into the distal LEFT colic artery branch vessels was performed and selective arteriography was obtained to identify the bleeding vessel. Super selective coil embolization was then performed using a single non detachable micro coil. A post embolization arteriogram  was performed without evidence of residual extravasation. Images were reviewed and the procedure was terminated. All wires, catheters and sheaths were removed from the patient. Hemostasis was achieved at the RIGHT groin access site with Angio-Seal closure device. The patient tolerated the procedure well without immediate post procedural complication. FINDINGS: - access via the RIGHT femoral artery. - No overt extravasation at the descending colon with suspected ooze from IMA territory, distal LEFT colic branch vessel - Successful embolization of the target vessel with a single micro coil - Angio-Seal closure at RIGHT groin. Palpable RLE pulses at the end of the Case IMPRESSION: Successful super-selective coil embolization of a target vessel at site of ooze from IMA territory, LEFT colic branch vessel, as above. PLAN: - The patient is to remain flat  for 2 hours with RIGHT leg straight. - The patient may continue to experience several additional bloody bowel movements however should clear the rectal vault in the coming days. Thom Hall, MD [facility] Radiology Electronically Signed   By: Thom Hall M.D.   On: 12/28/2023 11:15   IR Angiogram Selective Each Additional Vessel Result Date: 12/28/2023 INDICATION: Diverticular bleed.  Bright red blood per rectum. EXAM: Title; MESENTERIC ARTERIOGRAPHY and COIL EMBOLIZATION for LOWER GI BLEED Listed procedures; 1. ULTRASOUND GUIDANCE for RIGHT COMMON FEMORAL ARTERIAL ACCESS 2. INFERIOR MESENTERIC ARTERY ARTERIOGRAPHY 3. COIL EMBOLIZATION of a DISTAL LEFT COLIC BRANCH VESSEL at iDESCENDING COLON COMPARISON:  CTA AP, 12/27/2023 and 04/19/2023 MEDICATIONS: None ANESTHESIA/SEDATION: Moderate (conscious) sedation was employed during this procedure. A total of Versed  4 mg and Fentanyl  100 mcg was administered intravenously. Moderate Sedation Time: 60 minutes. The patient's level of consciousness and vital signs were monitored continuously by radiology nursing throughout the procedure under my direct supervision. CONTRAST:  45 mL Omnipaque  300 FLUOROSCOPY: Radiation Exposure Index and estimated peak skin dose (PSD); Reference air kerma (RAK), 211 mGy. COMPLICATIONS: None immediate. PROCEDURE: Informed consent was obtained from the patient and/or patient's representative following explanation of the procedure, risks, benefits and alternatives. All questions were addressed. A time out was performed prior to the initiation of the procedure. Maximal barrier sterile technique utilized including caps, mask, sterile gowns, sterile gloves, large sterile drape, hand hygiene, and Betadine prep. The RIGHT femoral head was marked fluoroscopically. Under sterile conditions and local anesthesia, the RIGHT common femoral artery access was performed with a micropuncture needle. Under  direct ultrasound guidance, the RIGHT common femoral was accessed with a micropuncture kit. An ultrasound image was saved for documentation purposes. This allowed for placement of a 5 Fr vascular sheath. A limited arteriogram was performed through the side arm of the sheath confirming appropriate access within the RIGHT common femoral artery. Over a Bentson wire, a Mickelson catheter was advanced, reformed, back bled and flushed. The catheter was then utilized to select the inferior mesenteric artery and a selective inferior mesenteric arteriogram was performed. Using a 2.1 Fr Progreat alpha microcatheter and microwire, access into the distal LEFT colic artery branch vessels was performed and selective arteriography was obtained to identify the bleeding vessel. Super selective coil embolization was then performed using a single non detachable micro coil. A post embolization arteriogram was performed without evidence of residual extravasation. Images were reviewed and the procedure was terminated. All wires, catheters and sheaths were removed from the patient. Hemostasis was achieved at the RIGHT groin access site with Angio-Seal closure device. The patient tolerated the procedure well without immediate post procedural complication. FINDINGS: - access via the RIGHT  femoral artery. - No overt extravasation at the descending colon with suspected ooze from IMA territory, distal LEFT colic branch vessel - Successful embolization of the target vessel with a single micro coil - Angio-Seal closure at RIGHT groin. Palpable RLE pulses at the end of the Case IMPRESSION: Successful super-selective coil embolization of a target vessel at site of ooze from IMA territory, LEFT colic branch vessel, as above. PLAN: - The patient is to remain flat for 2 hours with RIGHT leg straight. - The patient may continue to experience several additional bloody bowel movements however should clear the rectal vault in the coming days. Thom Hall, MD [facility] Radiology Electronically Signed   By: Thom Hall M.D.   On: 12/28/2023 11:15   IR US  Guide Vasc Access Right Result Date: 12/28/2023 INDICATION: Diverticular bleed.  Bright red blood per rectum. EXAM: Title; MESENTERIC ARTERIOGRAPHY and COIL EMBOLIZATION for LOWER GI BLEED Listed procedures; 1. ULTRASOUND GUIDANCE for RIGHT COMMON FEMORAL ARTERIAL ACCESS 2. INFERIOR MESENTERIC ARTERY ARTERIOGRAPHY 3. COIL EMBOLIZATION of a DISTAL LEFT COLIC BRANCH VESSEL at iDESCENDING COLON COMPARISON:  CTA AP, 12/27/2023 and 04/19/2023 MEDICATIONS: None ANESTHESIA/SEDATION: Moderate (conscious) sedation was employed during this procedure. A total of Versed  4 mg and Fentanyl  100 mcg was administered intravenously. Moderate Sedation Time: 60 minutes. The patient's level of consciousness and vital signs were monitored continuously by radiology nursing throughout the procedure under my direct supervision. CONTRAST:  45 mL Omnipaque  300 FLUOROSCOPY: Radiation Exposure Index and estimated peak skin dose (PSD); Reference air kerma (RAK), 211 mGy. COMPLICATIONS: None immediate. PROCEDURE: Informed consent was obtained from the patient and/or patient's representative following explanation of the procedure, risks, benefits and alternatives. All questions were addressed. A time out was performed prior to the initiation of the procedure. Maximal barrier sterile technique utilized including caps, mask, sterile gowns, sterile gloves, large sterile drape, hand hygiene, and Betadine prep. The RIGHT femoral head was marked fluoroscopically. Under sterile conditions and local anesthesia, the RIGHT common femoral artery access was performed with a micropuncture needle. Under direct ultrasound guidance, the RIGHT common femoral was accessed with a micropuncture kit. An ultrasound image was saved for documentation purposes. This allowed for placement of a 5 Fr vascular  sheath. A limited arteriogram was performed through the side arm of the sheath confirming appropriate access within the RIGHT common femoral artery. Over a Bentson wire, a Mickelson catheter was advanced, reformed, back bled and flushed. The catheter was then utilized to select the inferior mesenteric artery and a selective inferior mesenteric arteriogram was performed. Using a 2.1 Fr Progreat alpha microcatheter and microwire, access into the distal LEFT colic artery branch vessels was performed and selective arteriography was obtained to identify the bleeding vessel. Super selective coil embolization was then performed using a single non detachable micro coil. A post embolization arteriogram was performed without evidence of residual extravasation. Images were reviewed and the procedure was terminated. All wires, catheters and sheaths were removed from the patient. Hemostasis was achieved at the RIGHT groin access site with Angio-Seal closure device. The patient tolerated the procedure well without immediate post procedural complication. FINDINGS: - access via the RIGHT femoral artery. - No overt extravasation at the descending colon with suspected ooze from IMA territory, distal LEFT colic branch vessel - Successful embolization of the target vessel with a single micro coil - Angio-Seal closure at RIGHT groin. Palpable RLE pulses at the end of the Case IMPRESSION: Successful super-selective coil embolization of a  target vessel at site of ooze from IMA territory, LEFT colic branch vessel, as above. PLAN: - The patient is to remain flat for 2 hours with RIGHT leg straight. - The patient may continue to experience several additional bloody bowel movements however should clear the rectal vault in the coming days. Thom Hall, MD [facility] Radiology Electronically Signed   By: Thom Hall M.D.   On: 12/28/2023 11:15   CT ANGIO GI BLEED Result Date:  12/27/2023 CLINICAL DATA:  Rectal bleeding.  Left upper abdominal pain. EXAM: CTA ABDOMEN AND PELVIS WITHOUT AND WITH CONTRAST TECHNIQUE: Multidetector CT imaging of the abdomen and pelvis was performed using the standard protocol during bolus administration of intravenous contrast. Multiplanar reconstructed images and MIPs were obtained and reviewed to evaluate the vascular anatomy. RADIATION DOSE REDUCTION: This exam was performed according to the departmental dose-optimization program which includes automated exposure control, adjustment of the mA and/or kV according to patient size and/or use of iterative reconstruction technique. CONTRAST:  OMNIPAQUE  IOHEXOL  350 MG/ML SOLN COMPARISON:  None Available. FINDINGS: VASCULAR Aorta: Atherosclerotic disease in the abdominal aorta without aneurysm or dissection or significant stenosis. Celiac: Patent without evidence of aneurysm, dissection, vasculitis or significant stenosis. SMA: Patent without evidence of aneurysm, dissection, vasculitis or significant stenosis. Renals: Single bilateral renal arteries. Left renal artery is widely patent without aneurysm, dissection or stenosis. At least mild stenosis at origin of the right renal artery without aneurysm or dissection. IMA: Patent without evidence of aneurysm, dissection, vasculitis or significant stenosis. 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 Inflow: Patent without evidence of aneurysm, dissection, vasculitis or significant stenosis. Proximal Outflow: Proximal femoral arteries are patent bilaterally. Veins: Portal venous system is patent. Normal appearance of the IVC and renal veins. Normal appearance of the iliac veins. Review of the MIP images confirms the above findings. NON-VASCULAR Lower chest: Peripheral reticular densities at the lung bases are suggestive for chronic changes. No pleural effusions. Hepatobiliary: Gallbladder is mildly  distended with high-density sludge or small stones. No gallbladder inflammation. Main portal venous system is patent. No biliary dilatation. Pancreas: Unremarkable. No pancreatic ductal dilatation or surrounding inflammatory changes. Spleen: Normal in size without focal abnormality. Adrenals/Urinary Tract: Normal adrenal glands. No kidney stones. Cyst in the left kidney upper pole. No hydronephrosis. No suspicious renal lesion. Normal urinary bladder. Stomach/Bowel: Positive for active GI bleeding involving the descending colon. 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 The area of arterial bleeding in the colon is distal to the previously embolized area. Extensive diverticular disease throughout the colon. Endoscopic clip in the right colon near the ileocecal valve. Normal appendix. Normal stomach. No bowel dilatation or obstruction. Lymphatic: No lymph node enlargement in the abdomen or pelvis. Reproductive: Uterus and bilateral adnexa are unremarkable. Other: Negative for ascites.  Negative for free air. Musculoskeletal: No acute bone abnormality. Old vertebral body compression fracture at L1. IMPRESSION: 1. Positive for active GI bleeding involving the descending colon. Findings are most compatible with a diverticular bleed. 2. 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 3. Extensive diverticular disease throughout the colon. 4. Gallbladder sludge or small stones. No gallbladder inflammation. 5. Aortic Atherosclerosis (ICD10-I70.0). These results were called by telephone at the time of interpretation on 12/27/2023 at 5:12 pm to provider Dr. Efrain, who verbally acknowledged these results. Electronically Signed   By: Juliene Philip HERO.D.  On: 12/27/2023 20:33    Microbiology: Results for orders placed or performed during the hospital encounter of 12/27/23  MRSA Next Gen by PCR, Nasal     Status: None    Collection Time: 12/28/23  2:57 AM   Specimen: Nasal Mucosa; Nasal Swab  Result Value Ref Range Status   MRSA by PCR Next Gen NOT DETECTED NOT DETECTED Final    Comment: (NOTE) The GeneXpert MRSA Assay (FDA approved for NASAL specimens only), is one component of a comprehensive MRSA colonization surveillance program. It is not intended to diagnose MRSA infection nor to guide or monitor treatment for MRSA infections. Test performance is not FDA approved in patients less than 30 years old. Performed at [facility], [address], [place], [place] [address]     Labs: CBC: Recent Labs  Lab 12/27/23 1130 12/27/23 1139 12/28/23 0411 12/28/23 0843 12/29/23 0247 12/29/23 1314 12/29/23 1723 12/30/23 0324 12/31/23 0533  WBC 5.8  --  8.5  --  6.8 7.0 6.7 4.8 4.5  NEUTROABS 4.0  --  7.1  --   --   --   --   --   --   HGB 9.4*   < > 8.2*   < > 7.3* 7.7* 7.6* 7.1* 8.5*  HCT 31.2*   < > 26.7*   < > 24.4* 25.1* 24.8* 23.8* 27.9*  MCV 99.7  --  97.4  --  98.8 98.4 98.8 101.3* 98.2  PLT 170  --  121*  --  133* 152 145* 135* 160   < > = values in this interval not displayed.   Basic Metabolic Panel: Recent Labs  Lab 12/27/23 1130 12/27/23 1139 12/28/23 0411 12/29/23 0247 12/30/23 0324 12/31/23 0533  NA 140 142 140 139 139 140  K 3.4* 3.1* 3.4* 3.6 3.4* 3.7  CL 103 101 107 109 109 111  CO2 28  --  25 23 23 24   GLUCOSE 120* 120* 119* 99 122* 118*  BUN 11 9 10 9  7* <5*  CREATININE 0.52 0.60 0.45 0.36* 0.40* 0.34*  CALCIUM  8.4*  --  7.7* 8.2* 8.1* 8.4*  MG  --   --  1.9 2.5* 2.3  --    Liver Function Tests: Recent Labs  Lab 12/27/23 1130 12/28/23 0411  AST 23 20  ALT 8 9  ALKPHOS 65 49  BILITOT 0.8 1.9*  PROT 6.1* 4.9*  ALBUMIN 3.5 2.9*   CBG: No results for input(s): GLUCAP in the last 168 hours.  Discharge time spent: greater than 30 minutes.  Signed: Delon Herald, MD [facility] 12/31/2023

## 2023-12-31 NOTE — Progress Notes (Addendum)
 Physical Therapy Treatment Patient Details Name: [name] MRN: [id number] DOB: 1941-02-04 Today's Date: 12/31/2023   History of Present Illness CELESE [name] is a 83 year old female who presented to the hospital 12/27/23 with BRBPR , low hgb. Patient was admitted with LGI bleed.  EFY:jwkpzub, depression, chronic diastolic heart failure, osteoarthritis, glaucoma, hyperlipidemia, hypertension, pulmonary nodule, restless leg syndrome, osteoporosis, COPD, aortic arthrosclerosis, GERD, history of diverticular bleed with embolization, UGI bleed, chronic iron  deficiency anemia    PT Comments  Pt agreeable and motivated to work with PT, much improved from prior session.  pt spouse present for session, pt and husband asking PT about a voicemail he received (apparently from TOC/SW) pt and husband confused regarding message; requesting update on d/c plan and expressing that they do not want to go to SNF. MD and SW sent message via secure chat.   D/c plan updated to reflect HHPT, would benefit from [facility] as well. Pt and spouse appreciative   If plan is discharge home, recommend the following: A little help with walking and/or transfers;A little help with bathing/dressing/bathroom;Help with stairs or ramp for entrance   Can travel by private vehicle        Equipment Recommendations  None recommended by PT    Recommendations for Other Services  HHPT/OT     Precautions / Restrictions Precautions Precautions: Fall Recall of Precautions/Restrictions: Intact Precaution/Restrictions Comments: low vision Restrictions Weight Bearing Restrictions Per Provider Order: No     Mobility  Bed Mobility Overal bed mobility: Needs Assistance Bed Mobility: Supine to Sit     Supine to sit: Supervision     General bed mobility comments: bed flat, improved initiation. incr time however no physical assist    Transfers Overall transfer level: Needs assistance Equipment used: Rolling walker (2  wheels) Transfers: Sit to/from Stand Sit to Stand: Contact guard assist, Supervision           General transfer comment: cues for hand placement, Superv-CGA for safety with transition to RW    Ambulation/Gait Ambulation/Gait assistance: Contact guard assist to supervision Gait Distance (Feet): 120 Feet Assistive device: Rolling walker (2 wheels) Gait Pattern/deviations: Step-through pattern, Decreased stride length, Trunk flexed Gait velocity: decr     General Gait Details: cues for safety with turns. no overt LOB, no significant fatigue; no overt LOB or physical assist.   Stairs             Wheelchair Mobility     Tilt Bed    Modified Rankin (Stroke Patients Only)       Balance   Sitting-balance support: Feet supported, No upper extremity supported Sitting balance-Leahy Scale: Fair     Standing balance support: During functional activity, Reliant on assistive device for balance Standing balance-Leahy Scale: Fair                              Hotel manager: Impaired Factors Affecting Communication: Hearing impaired  Cognition Arousal: Alert Behavior During Therapy: WFL for tasks assessed/performed   PT - Cognitive impairments: No apparent impairments                         Following commands: Intact      Cueing Cueing Techniques: Verbal cues  Exercises      General Comments General comments (skin integrity, edema, etc.): pt spouse present for session, asking PT about voicemail he received (apparently from TOC/SW) pt  and husband confused regarding message;      Pertinent Vitals/Pain Pain Assessment Pain Assessment: Faces Faces Pain Scale: Hurts a little bit Pain Location: generalized, knees Pain Descriptors / Indicators: Discomfort Pain Intervention(s): Limited activity within patient's tolerance, Monitored during session, Repositioned    Home Living                           Prior Function            PT Goals (current goals can now be found in the care plan section) Acute Rehab PT Goals Patient Stated Goal: to go home PT Goal Formulation: With patient Time For Goal Achievement: 01/12/24 Potential to Achieve Goals: Good Progress towards PT goals: Progressing toward goals    Frequency    Min 3X/week      PT Plan      Co-evaluation              AM-PAC PT 6 Clicks Mobility   Outcome Measure  Help needed turning from your back to your side while in a flat bed without using bedrails?: A Little Help needed moving from lying on your back to sitting on the side of a flat bed without using bedrails?: A Little Help needed moving to and from a bed to a chair (including a wheelchair)?: A Little Help needed standing up from a chair using your arms (e.g., wheelchair or bedside chair)?: A Little Help needed to walk in hospital room?: A Little Help needed climbing 3-5 steps with a railing? : A Lot 6 Click Score: 17    End of Session Equipment Utilized During Treatment: Gait belt Activity Tolerance: Patient tolerated treatment well Patient left: in chair;with call bell/phone within reach;with family/visitor present Nurse Communication: Mobility status PT Visit Diagnosis: Unsteadiness on feet (R26.81);Difficulty in walking, not elsewhere classified (R26.2)     Time: 8570-8545 PT Time Calculation (min) (ACUTE ONLY): 25 min  Charges:    $Gait Training: 23-37 mins PT General Charges $$ ACUTE PT VISIT: 1 Visit                     Dalan Cowger, PT  Acute Rehab Dept [facility]) [phone]  12/31/2023    [facility] 12/31/2023, 3:35 PM

## 2023-12-31 NOTE — Plan of Care (Signed)
   Problem: Education: Goal: Knowledge of General Education information will improve Description: Including pain rating scale, medication(s)/side effects and non-pharmacologic comfort measures Outcome: Progressing   Problem: Clinical Measurements: Goal: Ability to maintain clinical measurements within normal limits will improve Outcome: Progressing   Problem: Nutrition: Goal: Adequate nutrition will be maintained Outcome: Progressing

## 2024-01-01 DIAGNOSIS — M159 Polyosteoarthritis, unspecified: Secondary | ICD-10-CM | POA: Diagnosis not present

## 2024-01-01 DIAGNOSIS — Z8719 Personal history of other diseases of the digestive system: Secondary | ICD-10-CM | POA: Diagnosis not present

## 2024-01-01 DIAGNOSIS — J418 Mixed simple and mucopurulent chronic bronchitis: Secondary | ICD-10-CM | POA: Diagnosis not present

## 2024-01-01 DIAGNOSIS — I509 Heart failure, unspecified: Secondary | ICD-10-CM | POA: Diagnosis not present

## 2024-01-01 DIAGNOSIS — D509 Iron deficiency anemia, unspecified: Secondary | ICD-10-CM | POA: Diagnosis not present

## 2024-01-01 DIAGNOSIS — F419 Anxiety disorder, unspecified: Secondary | ICD-10-CM | POA: Diagnosis not present

## 2024-01-01 DIAGNOSIS — M818 Other osteoporosis without current pathological fracture: Secondary | ICD-10-CM | POA: Diagnosis not present

## 2024-01-01 DIAGNOSIS — H409 Unspecified glaucoma: Secondary | ICD-10-CM | POA: Diagnosis not present

## 2024-01-01 DIAGNOSIS — G2581 Restless legs syndrome: Secondary | ICD-10-CM | POA: Diagnosis not present

## 2024-01-01 DIAGNOSIS — Z743 Need for continuous supervision: Secondary | ICD-10-CM | POA: Diagnosis not present

## 2024-01-01 DIAGNOSIS — E876 Hypokalemia: Secondary | ICD-10-CM | POA: Diagnosis not present

## 2024-01-01 DIAGNOSIS — M810 Age-related osteoporosis without current pathological fracture: Secondary | ICD-10-CM | POA: Diagnosis not present

## 2024-01-01 DIAGNOSIS — N3281 Overactive bladder: Secondary | ICD-10-CM | POA: Diagnosis not present

## 2024-01-01 DIAGNOSIS — I5032 Chronic diastolic (congestive) heart failure: Secondary | ICD-10-CM | POA: Diagnosis not present

## 2024-01-01 DIAGNOSIS — S32010D Wedge compression fracture of first lumbar vertebra, subsequent encounter for fracture with routine healing: Secondary | ICD-10-CM | POA: Diagnosis not present

## 2024-01-01 DIAGNOSIS — K9289 Other specified diseases of the digestive system: Secondary | ICD-10-CM | POA: Diagnosis not present

## 2024-01-01 DIAGNOSIS — F339 Major depressive disorder, recurrent, unspecified: Secondary | ICD-10-CM | POA: Diagnosis not present

## 2024-01-01 DIAGNOSIS — K5791 Diverticulosis of intestine, part unspecified, without perforation or abscess with bleeding: Secondary | ICD-10-CM | POA: Diagnosis not present

## 2024-01-01 DIAGNOSIS — I10 Essential (primary) hypertension: Secondary | ICD-10-CM | POA: Diagnosis not present

## 2024-01-01 DIAGNOSIS — R000 Tachycardia, unspecified: Secondary | ICD-10-CM | POA: Diagnosis not present

## 2024-01-01 DIAGNOSIS — M6281 Muscle weakness (generalized): Secondary | ICD-10-CM | POA: Diagnosis not present

## 2024-01-01 DIAGNOSIS — Z7401 Bed confinement status: Secondary | ICD-10-CM | POA: Diagnosis not present

## 2024-01-01 DIAGNOSIS — K5900 Constipation, unspecified: Secondary | ICD-10-CM | POA: Diagnosis not present

## 2024-01-01 DIAGNOSIS — I700 Atherosclerosis of aorta: Secondary | ICD-10-CM | POA: Diagnosis not present

## 2024-01-01 DIAGNOSIS — K922 Gastrointestinal hemorrhage, unspecified: Secondary | ICD-10-CM | POA: Diagnosis not present

## 2024-01-01 DIAGNOSIS — R32 Unspecified urinary incontinence: Secondary | ICD-10-CM | POA: Diagnosis not present

## 2024-01-01 DIAGNOSIS — R2681 Unsteadiness on feet: Secondary | ICD-10-CM | POA: Diagnosis not present

## 2024-01-01 DIAGNOSIS — D649 Anemia, unspecified: Secondary | ICD-10-CM | POA: Diagnosis not present

## 2024-01-01 DIAGNOSIS — M150 Primary generalized (osteo)arthritis: Secondary | ICD-10-CM | POA: Diagnosis not present

## 2024-01-01 DIAGNOSIS — J449 Chronic obstructive pulmonary disease, unspecified: Secondary | ICD-10-CM | POA: Diagnosis not present

## 2024-01-01 DIAGNOSIS — K219 Gastro-esophageal reflux disease without esophagitis: Secondary | ICD-10-CM | POA: Diagnosis not present

## 2024-01-01 DIAGNOSIS — N3946 Mixed incontinence: Secondary | ICD-10-CM | POA: Diagnosis not present

## 2024-01-01 DIAGNOSIS — D500 Iron deficiency anemia secondary to blood loss (chronic): Secondary | ICD-10-CM | POA: Diagnosis not present

## 2024-01-01 DIAGNOSIS — R41841 Cognitive communication deficit: Secondary | ICD-10-CM | POA: Diagnosis not present

## 2024-01-01 DIAGNOSIS — E785 Hyperlipidemia, unspecified: Secondary | ICD-10-CM | POA: Diagnosis not present

## 2024-01-01 NOTE — Discharge Summary (Addendum)
 Physician Discharge Summary   Patient: [name] MRN: [id number] DOB: May 16, 1941  Admit date:     12/27/2023  Discharge date: 01/01/24  Discharge Physician: Delon Herald   PCP: Gil Greig BRAVO, NP   Recommendations at discharge:   You are being discharged to SNF rehabilitation Continue to monitor for recurrent bleeding as well as shortness of breath/dizziness with standing/walking Follow up with NP [name] in 1-2 weeks Follow up with Dr. Dianna in 4-6 weeks  Discharge Diagnoses: Principal Problem:   Acute lower GI bleeding Active Problems:   Hyperlipidemia   COPD (chronic obstructive pulmonary disease) (HCC)   Restless leg syndrome   Hypertension   GERD (gastroesophageal reflux disease)   GAD (generalized anxiety disorder)   Chronic heart failure with preserved ejection fraction (HFpEF) (HCC)   Acute on chronic blood loss anemia   Hypokalemia    Hospital Course: 83yo with h/o IDA and COPD who presented on 6/28 with BRBPR. CTA with active bleeding in descending colon c/w acute diverticular bleeding. GI was consulted and IR performed embolization. S/p 2 units PRBC.    Assessment and Plan:  Acute lower gastrointestinal bleeding from acute diverticular source 1 day history of new onset hematochezia with 8-9 total episodes CTA showed active extravasation in the descending colon Underwent successful IR embolization Transfused 2 units PRBC Hgb up to 8.5 and no further reports of bleeding GI consulted; patient will need f/u with Dr. Dianna in 4-6 weeks Continue BID Protonix    Acute blood loss anemia superimposed on chronic iron  deficiency anemia Baseline hemoglobin 8-10 Initial hemoglobin of 6.4 in the setting of acute lower gastrointestinal bleed Received a total of 2 units of packed RBC Continue Fergon   COPD Continue Anoro Ellipta , Albuterol    Appears compensated   Chronic HFpEF Continue torsemide  (resume on 7/3)   HLD Continue simvastatin    Depression   Continue sertraline    Debility, deconditioning, weakness Patient is from assisted living facility and lives with her husband PT/OT consulting After discussion between PT/OT, MD, and her ALF via the [facility] team, she appears to need too much assistance at this time to return to ALF and instead will be discharged to SNF rehab at [facility]   RLS Continue ropinirole    Glaucoma Continue dorzolamide -timolol , latanoprost , Rhopressa    Class 1 Obesity Body mass index is 31.62 kg/m. Weight loss should be encouraged Outpatient PCP/bariatric medicine f/u encouraged Significantly low or high BMI is associated with higher medical risk including morbidity and mortality    DNR DNR confirmed at the time of admission [facility] documents reviewed Patient will need a gold out of facility DNR form at the time of discharge         Consultants: PCCM GI IR PT OT TOC team   Procedures: Mesenteric angiography coil embolization 6/29   Antibiotics: None  Pain control - [employer]  Controlled Substance Reporting System database was reviewed. and patient was instructed, not to drive, operate heavy machinery, perform activities at heights, swimming or participation in water activities or provide baby-sitting services while on Pain, Sleep and Anxiety Medications; until their outpatient Physician has advised to do so again. Also recommended to not to take more than prescribed Pain, Sleep and Anxiety Medications.   Disposition: Skilled nursing facility Diet recommendation:  Discharge Diet Orders (From admission, onward)     Start     Ordered   12/31/23 0000  Diet general        12/31/23 1536  Regular diet DISCHARGE MEDICATION: Allergies as of 01/01/2024       Reactions   Morphine And Codeine Itching   Aspirin Other (See Comments)   Unknown reaction   Duloxetine  Hcl Other (See Comments)   Unknown reaction   Tymlos [abaloparatide] Other (See Comments)   Unknown reaction    Ventolin  [albuterol ] Other (See Comments)   rapid heart beat   Cefadroxil Hives   Patient can take amoxicillin  and cipro         Medication List     STOP taking these medications    docusate sodium  100 MG capsule Commonly known as: COLACE   potassium chloride  SA 20 MEQ tablet Commonly known as: KLOR-CON  M       TAKE these medications    acetaminophen  650 MG CR tablet Commonly known as: TYLENOL  Take 1,300 mg by mouth in the morning and at bedtime.   albuterol  108 (90 Base) MCG/ACT inhaler Commonly known as: VENTOLIN  HFA Inhale 2 puffs into the lungs every 4 (four) hours as needed for wheezing or shortness of breath.   alendronate 70 MG tablet Commonly known as: FOSAMAX Take 70 mg by mouth every Sunday. Take with a full glass of water on an empty stomach.   Align 4 MG Caps Take 4 mg by mouth at bedtime.   Anoro Ellipta  62.5-25 MCG/ACT Aepb Generic drug: umeclidinium-vilanterol Inhale 1 puff into the lungs every morning.   cetirizine  5 MG chewable tablet Commonly known as: ZYRTEC  Chew 5 mg by mouth daily as needed for allergies.   cyanocobalamin  1000 MCG tablet Commonly known as: VITAMIN B12 Take 1,000 mcg by mouth daily.   Dorzolamide  HCl-Timolol  Mal PF 2-0.5 % Soln Place 1 drop into both eyes in the morning and at bedtime.   ferrous gluconate  240 (27 FE) MG tablet Commonly known as: FERGON Take 240 mg by mouth every morning. Take without food   latanoprost  0.005 % ophthalmic solution Commonly known as: XALATAN  Place 1 drop into both eyes at bedtime.   oxybutynin  10 MG 24 hr tablet Commonly known as: DITROPAN -XL Take 10 mg by mouth at bedtime.   pantoprazole  40 MG tablet Commonly known as: PROTONIX  Take 1 tablet (40 mg total) by mouth 2 (two) times daily before a meal.   potassium chloride  20 MEQ/15ML (10%) Soln Take 15 mLs by mouth daily.   PreserVision AREDS 2 Caps Take 1 capsule by mouth 2 (two) times daily.   PreviDent 5000 Booster Plus 1.1  % Pste Generic drug: Sodium Fluoride  Place 1 Application onto teeth See admin instructions. Brush on teeth with a toothbrush after evening mouth care. Spit out excess and do not rinse.   Rhopressa  0.02 % Soln Generic drug: Netarsudil  Dimesylate Place 1 drop into both eyes at bedtime.   rOPINIRole  0.5 MG tablet Commonly known as: REQUIP  Take 1 tablet (0.5 mg total) by mouth daily as needed.   senna 8.6 MG Tabs tablet Commonly known as: SENOKOT Take 2 tablets by mouth at bedtime.   sertraline  50 MG tablet Commonly known as: ZOLOFT  Take 1.5 tablets (75 mg total) by mouth daily.   simvastatin  20 MG tablet Commonly known as: ZOCOR  Take 20 mg by mouth every evening.   torsemide  20 MG tablet Commonly known as: DEMADEX  Take 20 mg by mouth daily.   traMADol  HCl 25 MG Tabs Take 25 mg by mouth 2 (two) times daily.   vitamin C  1000 MG tablet Take 1,000 mg by mouth every morning.   Vitamin D3 50 MCG (  2000 UT) Tabs Take 2,000 Units by mouth every morning.        Discharge Exam:   Subjective: Discharge was held on 7/2 when her ALF refused to accept her back at her current level of functioning.  Instead, she will go to SNF rehab today.  She was sleeping this AM, no new concerns.   Objective: Vitals:   01/01/24 0438 01/01/24 0801  BP: (!) 111/52 (!) 116/54  Pulse: 77 74  Resp: 19 20  Temp: 97.9 F (36.6 C) 98.6 F (37 C)  SpO2: 94% 97%   No intake or output data in the 24 hours ending 01/01/24 0918  Filed Weights   12/29/23 0500 12/30/23 0306 01/01/24 0339  Weight: 76.2 kg 75.9 kg 71.3 kg    Exam:  General:  Appears calm and comfortable and is in NAD Eyes:   normal lids, iris ENT:   hard of hearing, grossly normal lips & tongue, mmm Neck:  no LAD, masses or thyromegaly Cardiovascular:  RRR, no m/r/g. No LE edema.  Respiratory:   CTA bilaterally with no wheezes/rales/rhonchi.  Normal respiratory effort. Abdomen:  soft, NT, ND Skin:  no rash or induration seen  on limited exam Musculoskeletal:  grossly normal tone BUE/BLE, good ROM, no bony abnormality Psychiatric:  grossly normal mood and affect, speech fluent and appropriate, AOx3 Neurologic:  CN 2-12 grossly intact, moves all extremities in coordinated fashion  Data Reviewed: I have reviewed the patient's lab results since admission.  Pertinent labs for today include:   None today, stable on 7/2    Condition at discharge: stable  The results of significant diagnostics from this hospitalization (including imaging, microbiology, ancillary and laboratory) are listed below for reference.   Imaging Studies: IR EMBO ART  VEN HEMORR LYMPH EXTRAV  INC GUIDE ROADMAPPING Result Date: 12/28/2023 INDICATION: Diverticular bleed.  Bright red blood per rectum. EXAM: Title; MESENTERIC ARTERIOGRAPHY and COIL EMBOLIZATION for LOWER GI BLEED Listed procedures; 1. ULTRASOUND GUIDANCE for RIGHT COMMON FEMORAL ARTERIAL ACCESS 2. INFERIOR MESENTERIC ARTERY ARTERIOGRAPHY 3. COIL EMBOLIZATION of a DISTAL LEFT COLIC BRANCH VESSEL at iDESCENDING COLON COMPARISON:  CTA AP, 12/27/2023 and 04/19/2023 MEDICATIONS: None ANESTHESIA/SEDATION: Moderate (conscious) sedation was employed during this procedure. A total of Versed  4 mg and Fentanyl  100 mcg was administered intravenously. Moderate Sedation Time: 60 minutes. The patient's level of consciousness and vital signs were monitored continuously by radiology nursing throughout the procedure under my direct supervision. CONTRAST:  45 mL Omnipaque  300 FLUOROSCOPY: Radiation Exposure Index and estimated peak skin dose (PSD); Reference air kerma (RAK), 211 mGy. COMPLICATIONS: None immediate. PROCEDURE: Informed consent was obtained from the patient and/or patient's representative following explanation of the procedure, risks, benefits and alternatives. All questions were addressed. A time out was performed prior to the initiation of the procedure. Maximal barrier sterile technique  utilized including caps, mask, sterile gowns, sterile gloves, large sterile drape, hand hygiene, and Betadine prep. The RIGHT femoral head was marked fluoroscopically. Under sterile conditions and local anesthesia, the RIGHT common femoral artery access was performed with a micropuncture needle. Under direct ultrasound guidance, the RIGHT common femoral was accessed with a micropuncture kit. An ultrasound image was saved for documentation purposes. This allowed for placement of a 5 Fr vascular sheath. A limited arteriogram was performed through the side arm of the sheath confirming appropriate access within the RIGHT common femoral artery. Over a Bentson wire, a Mickelson catheter was advanced, reformed, back bled and flushed. The catheter was then utilized to select  the inferior mesenteric artery and a selective inferior mesenteric arteriogram was performed. Using a 2.1 Fr Progreat alpha microcatheter and microwire, access into the distal LEFT colic artery branch vessels was performed and selective arteriography was obtained to identify the bleeding vessel. Super selective coil embolization was then performed using a single non detachable micro coil. A post embolization arteriogram was performed without evidence of residual extravasation. Images were reviewed and the procedure was terminated. All wires, catheters and sheaths were removed from the patient. Hemostasis was achieved at the RIGHT groin access site with Angio-Seal closure device. The patient tolerated the procedure well without immediate post procedural complication. FINDINGS: - access via the RIGHT femoral artery. - No overt extravasation at the descending colon with suspected ooze from IMA territory, distal LEFT colic branch vessel - Successful embolization of the target vessel with a single micro coil - Angio-Seal closure at RIGHT groin. Palpable RLE pulses at the end of the Case IMPRESSION: Successful super-selective coil embolization of a target  vessel at site of ooze from IMA territory, LEFT colic branch vessel, as above. PLAN: - The patient is to remain flat for 2 hours with RIGHT leg straight. - The patient may continue to experience several additional bloody bowel movements however should clear the rectal vault in the coming days. Thom Hall, MD [facility] Radiology Electronically Signed   By: Thom Hall M.D.   On: 12/28/2023 11:15   IR Angiogram Visceral Selective Result Date: 12/28/2023 INDICATION: Diverticular bleed.  Bright red blood per rectum. EXAM: Title; MESENTERIC ARTERIOGRAPHY and COIL EMBOLIZATION for LOWER GI BLEED Listed procedures; 1. ULTRASOUND GUIDANCE for RIGHT COMMON FEMORAL ARTERIAL ACCESS 2. INFERIOR MESENTERIC ARTERY ARTERIOGRAPHY 3. COIL EMBOLIZATION of a DISTAL LEFT COLIC BRANCH VESSEL at iDESCENDING COLON COMPARISON:  CTA AP, 12/27/2023 and 04/19/2023 MEDICATIONS: None ANESTHESIA/SEDATION: Moderate (conscious) sedation was employed during this procedure. A total of Versed  4 mg and Fentanyl  100 mcg was administered intravenously. Moderate Sedation Time: 60 minutes. The patient's level of consciousness and vital signs were monitored continuously by radiology nursing throughout the procedure under my direct supervision. CONTRAST:  45 mL Omnipaque  300 FLUOROSCOPY: Radiation Exposure Index and estimated peak skin dose (PSD); Reference air kerma (RAK), 211 mGy. COMPLICATIONS: None immediate. PROCEDURE: Informed consent was obtained from the patient and/or patient's representative following explanation of the procedure, risks, benefits and alternatives. All questions were addressed. A time out was performed prior to the initiation of the procedure. Maximal barrier sterile technique utilized including caps, mask, sterile gowns, sterile gloves, large sterile drape, hand hygiene, and Betadine prep. The RIGHT femoral head was marked fluoroscopically. Under sterile conditions and local  anesthesia, the RIGHT common femoral artery access was performed with a micropuncture needle. Under direct ultrasound guidance, the RIGHT common femoral was accessed with a micropuncture kit. An ultrasound image was saved for documentation purposes. This allowed for placement of a 5 Fr vascular sheath. A limited arteriogram was performed through the side arm of the sheath confirming appropriate access within the RIGHT common femoral artery. Over a Bentson wire, a Mickelson catheter was advanced, reformed, back bled and flushed. The catheter was then utilized to select the inferior mesenteric artery and a selective inferior mesenteric arteriogram was performed. Using a 2.1 Fr Progreat alpha microcatheter and microwire, access into the distal LEFT colic artery branch vessels was performed and selective arteriography was obtained to identify the bleeding vessel. Super selective coil embolization was then performed using a single non detachable micro coil. A  post embolization arteriogram was performed without evidence of residual extravasation. Images were reviewed and the procedure was terminated. All wires, catheters and sheaths were removed from the patient. Hemostasis was achieved at the RIGHT groin access site with Angio-Seal closure device. The patient tolerated the procedure well without immediate post procedural complication. FINDINGS: - access via the RIGHT femoral artery. - No overt extravasation at the descending colon with suspected ooze from IMA territory, distal LEFT colic branch vessel - Successful embolization of the target vessel with a single micro coil - Angio-Seal closure at RIGHT groin. Palpable RLE pulses at the end of the Case IMPRESSION: Successful super-selective coil embolization of a target vessel at site of ooze from IMA territory, LEFT colic branch vessel, as above. PLAN: - The patient is to remain flat for 2 hours with RIGHT leg straight. - The patient may continue to experience several  additional bloody bowel movements however should clear the rectal vault in the coming days. Thom Hall, MD [facility] Radiology Electronically Signed   By: Thom Hall M.D.   On: 12/28/2023 11:15   IR Angiogram Selective Each Additional Vessel Result Date: 12/28/2023 INDICATION: Diverticular bleed.  Bright red blood per rectum. EXAM: Title; MESENTERIC ARTERIOGRAPHY and COIL EMBOLIZATION for LOWER GI BLEED Listed procedures; 1. ULTRASOUND GUIDANCE for RIGHT COMMON FEMORAL ARTERIAL ACCESS 2. INFERIOR MESENTERIC ARTERY ARTERIOGRAPHY 3. COIL EMBOLIZATION of a DISTAL LEFT COLIC BRANCH VESSEL at iDESCENDING COLON COMPARISON:  CTA AP, 12/27/2023 and 04/19/2023 MEDICATIONS: None ANESTHESIA/SEDATION: Moderate (conscious) sedation was employed during this procedure. A total of Versed  4 mg and Fentanyl  100 mcg was administered intravenously. Moderate Sedation Time: 60 minutes. The patient's level of consciousness and vital signs were monitored continuously by radiology nursing throughout the procedure under my direct supervision. CONTRAST:  45 mL Omnipaque  300 FLUOROSCOPY: Radiation Exposure Index and estimated peak skin dose (PSD); Reference air kerma (RAK), 211 mGy. COMPLICATIONS: None immediate. PROCEDURE: Informed consent was obtained from the patient and/or patient's representative following explanation of the procedure, risks, benefits and alternatives. All questions were addressed. A time out was performed prior to the initiation of the procedure. Maximal barrier sterile technique utilized including caps, mask, sterile gowns, sterile gloves, large sterile drape, hand hygiene, and Betadine prep. The RIGHT femoral head was marked fluoroscopically. Under sterile conditions and local anesthesia, the RIGHT common femoral artery access was performed with a micropuncture needle. Under direct ultrasound guidance, the RIGHT common femoral was accessed with a micropuncture  kit. An ultrasound image was saved for documentation purposes. This allowed for placement of a 5 Fr vascular sheath. A limited arteriogram was performed through the side arm of the sheath confirming appropriate access within the RIGHT common femoral artery. Over a Bentson wire, a Mickelson catheter was advanced, reformed, back bled and flushed. The catheter was then utilized to select the inferior mesenteric artery and a selective inferior mesenteric arteriogram was performed. Using a 2.1 Fr Progreat alpha microcatheter and microwire, access into the distal LEFT colic artery branch vessels was performed and selective arteriography was obtained to identify the bleeding vessel. Super selective coil embolization was then performed using a single non detachable micro coil. A post embolization arteriogram was performed without evidence of residual extravasation. Images were reviewed and the procedure was terminated. All wires, catheters and sheaths were removed from the patient. Hemostasis was achieved at the RIGHT groin access site with Angio-Seal closure device. The patient tolerated the procedure well without immediate post procedural complication. FINDINGS: - access  via the RIGHT femoral artery. - No overt extravasation at the descending colon with suspected ooze from IMA territory, distal LEFT colic branch vessel - Successful embolization of the target vessel with a single micro coil - Angio-Seal closure at RIGHT groin. Palpable RLE pulses at the end of the Case IMPRESSION: Successful super-selective coil embolization of a target vessel at site of ooze from IMA territory, LEFT colic branch vessel, as above. PLAN: - The patient is to remain flat for 2 hours with RIGHT leg straight. - The patient may continue to experience several additional bloody bowel movements however should clear the rectal vault in the coming days. Thom Hall, MD [facility] Radiology  Electronically Signed   By: Thom Hall M.D.   On: 12/28/2023 11:15   IR US  Guide Vasc Access Right Result Date: 12/28/2023 INDICATION: Diverticular bleed.  Bright red blood per rectum. EXAM: Title; MESENTERIC ARTERIOGRAPHY and COIL EMBOLIZATION for LOWER GI BLEED Listed procedures; 1. ULTRASOUND GUIDANCE for RIGHT COMMON FEMORAL ARTERIAL ACCESS 2. INFERIOR MESENTERIC ARTERY ARTERIOGRAPHY 3. COIL EMBOLIZATION of a DISTAL LEFT COLIC BRANCH VESSEL at iDESCENDING COLON COMPARISON:  CTA AP, 12/27/2023 and 04/19/2023 MEDICATIONS: None ANESTHESIA/SEDATION: Moderate (conscious) sedation was employed during this procedure. A total of Versed  4 mg and Fentanyl  100 mcg was administered intravenously. Moderate Sedation Time: 60 minutes. The patient's level of consciousness and vital signs were monitored continuously by radiology nursing throughout the procedure under my direct supervision. CONTRAST:  45 mL Omnipaque  300 FLUOROSCOPY: Radiation Exposure Index and estimated peak skin dose (PSD); Reference air kerma (RAK), 211 mGy. COMPLICATIONS: None immediate. PROCEDURE: Informed consent was obtained from the patient and/or patient's representative following explanation of the procedure, risks, benefits and alternatives. All questions were addressed. A time out was performed prior to the initiation of the procedure. Maximal barrier sterile technique utilized including caps, mask, sterile gowns, sterile gloves, large sterile drape, hand hygiene, and Betadine prep. The RIGHT femoral head was marked fluoroscopically. Under sterile conditions and local anesthesia, the RIGHT common femoral artery access was performed with a micropuncture needle. Under direct ultrasound guidance, the RIGHT common femoral was accessed with a micropuncture kit. An ultrasound image was saved for documentation purposes. This allowed for placement of a 5 Fr vascular sheath. A limited arteriogram was performed through the side arm of the sheath  confirming appropriate access within the RIGHT common femoral artery. Over a Bentson wire, a Mickelson catheter was advanced, reformed, back bled and flushed. The catheter was then utilized to select the inferior mesenteric artery and a selective inferior mesenteric arteriogram was performed. Using a 2.1 Fr Progreat alpha microcatheter and microwire, access into the distal LEFT colic artery branch vessels was performed and selective arteriography was obtained to identify the bleeding vessel. Super selective coil embolization was then performed using a single non detachable micro coil. A post embolization arteriogram was performed without evidence of residual extravasation. Images were reviewed and the procedure was terminated. All wires, catheters and sheaths were removed from the patient. Hemostasis was achieved at the RIGHT groin access site with Angio-Seal closure device. The patient tolerated the procedure well without immediate post procedural complication. FINDINGS: - access via the RIGHT femoral artery. - No overt extravasation at the descending colon with suspected ooze from IMA territory, distal LEFT colic branch vessel - Successful embolization of the target vessel with a single micro coil - Angio-Seal closure at RIGHT groin. Palpable RLE pulses at the end of the Case IMPRESSION: Successful super-selective coil  embolization of a target vessel at site of ooze from IMA territory, LEFT colic branch vessel, as above. PLAN: - The patient is to remain flat for 2 hours with RIGHT leg straight. - The patient may continue to experience several additional bloody bowel movements however should clear the rectal vault in the coming days. Thom Hall, MD [facility] Radiology Electronically Signed   By: Thom Hall M.D.   On: 12/28/2023 11:15   CT ANGIO GI BLEED Result Date: 12/27/2023 CLINICAL DATA:  Rectal bleeding.  Left upper abdominal pain. EXAM: CTA ABDOMEN AND  PELVIS WITHOUT AND WITH CONTRAST TECHNIQUE: Multidetector CT imaging of the abdomen and pelvis was performed using the standard protocol during bolus administration of intravenous contrast. Multiplanar reconstructed images and MIPs were obtained and reviewed to evaluate the vascular anatomy. RADIATION DOSE REDUCTION: This exam was performed according to the departmental dose-optimization program which includes automated exposure control, adjustment of the mA and/or kV according to patient size and/or use of iterative reconstruction technique. CONTRAST:  OMNIPAQUE  IOHEXOL  350 MG/ML SOLN COMPARISON:  None Available. FINDINGS: VASCULAR Aorta: Atherosclerotic disease in the abdominal aorta without aneurysm or dissection or significant stenosis. Celiac: Patent without evidence of aneurysm, dissection, vasculitis or significant stenosis. SMA: Patent without evidence of aneurysm, dissection, vasculitis or significant stenosis. Renals: Single bilateral renal arteries. Left renal artery is widely patent without aneurysm, dissection or stenosis. At least mild stenosis at origin of the right renal artery without aneurysm or dissection. IMA: Patent without evidence of aneurysm, dissection, vasculitis or significant stenosis. 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 Inflow: Patent without evidence of aneurysm, dissection, vasculitis or significant stenosis. Proximal Outflow: Proximal femoral arteries are patent bilaterally. Veins: Portal venous system is patent. Normal appearance of the IVC and renal veins. Normal appearance of the iliac veins. Review of the MIP images confirms the above findings. NON-VASCULAR Lower chest: Peripheral reticular densities at the lung bases are suggestive for chronic changes. No pleural effusions. Hepatobiliary: Gallbladder is mildly distended with high-density sludge or small stones. No gallbladder inflammation. Main portal  venous system is patent. No biliary dilatation. Pancreas: Unremarkable. No pancreatic ductal dilatation or surrounding inflammatory changes. Spleen: Normal in size without focal abnormality. Adrenals/Urinary Tract: Normal adrenal glands. No kidney stones. Cyst in the left kidney upper pole. No hydronephrosis. No suspicious renal lesion. Normal urinary bladder. Stomach/Bowel: Positive for active GI bleeding involving the descending colon. 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 The area of arterial bleeding in the colon is distal to the previously embolized area. Extensive diverticular disease throughout the colon. Endoscopic clip in the right colon near the ileocecal valve. Normal appendix. Normal stomach. No bowel dilatation or obstruction. Lymphatic: No lymph node enlargement in the abdomen or pelvis. Reproductive: Uterus and bilateral adnexa are unremarkable. Other: Negative for ascites.  Negative for free air. Musculoskeletal: No acute bone abnormality. Old vertebral body compression fracture at L1. IMPRESSION: 1. Positive for active GI bleeding involving the descending colon. Findings are most compatible with a diverticular bleed. 2. 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 3. Extensive diverticular disease throughout the colon. 4. Gallbladder sludge or small stones. No gallbladder inflammation. 5. Aortic Atherosclerosis (ICD10-I70.0). These results were called by telephone at the time of interpretation on 12/27/2023 at 5:12 pm to provider Dr. Efrain, who verbally acknowledged these results. Electronically Signed   By: Juliene  Philip M.D.   On: 12/27/2023 20:33    Microbiology: Results for orders placed or performed during the hospital encounter of 12/27/23  MRSA Next Gen by PCR, Nasal     Status: None   Collection Time: 12/28/23  2:57 AM   Specimen: Nasal Mucosa; Nasal Swab  Result Value Ref Range  Status   MRSA by PCR Next Gen NOT DETECTED NOT DETECTED Final    Comment: (NOTE) The GeneXpert MRSA Assay (FDA approved for NASAL specimens only), is one component of a comprehensive MRSA colonization surveillance program. It is not intended to diagnose MRSA infection nor to guide or monitor treatment for MRSA infections. Test performance is not FDA approved in patients less than 68 years old. Performed at [facility], [address], [place], [place] [address]     Labs: CBC: Recent Labs  Lab 12/27/23 1130 12/27/23 1139 12/28/23 0411 12/28/23 0843 12/29/23 0247 12/29/23 1314 12/29/23 1723 12/30/23 0324 12/31/23 0533  WBC 5.8  --  8.5  --  6.8 7.0 6.7 4.8 4.5  NEUTROABS 4.0  --  7.1  --   --   --   --   --   --   HGB 9.4*   < > 8.2*   < > 7.3* 7.7* 7.6* 7.1* 8.5*  HCT 31.2*   < > 26.7*   < > 24.4* 25.1* 24.8* 23.8* 27.9*  MCV 99.7  --  97.4  --  98.8 98.4 98.8 101.3* 98.2  PLT 170  --  121*  --  133* 152 145* 135* 160   < > = values in this interval not displayed.   Basic Metabolic Panel: Recent Labs  Lab 12/27/23 1130 12/27/23 1139 12/28/23 0411 12/29/23 0247 12/30/23 0324 12/31/23 0533  NA 140 142 140 139 139 140  K 3.4* 3.1* 3.4* 3.6 3.4* 3.7  CL 103 101 107 109 109 111  CO2 28  --  25 23 23 24   GLUCOSE 120* 120* 119* 99 122* 118*  BUN 11 9 10 9  7* <5*  CREATININE 0.52 0.60 0.45 0.36* 0.40* 0.34*  CALCIUM  8.4*  --  7.7* 8.2* 8.1* 8.4*  MG  --   --  1.9 2.5* 2.3  --    Liver Function Tests: Recent Labs  Lab 12/27/23 1130 12/28/23 0411  AST 23 20  ALT 8 9  ALKPHOS 65 49  BILITOT 0.8 1.9*  PROT 6.1* 4.9*  ALBUMIN 3.5 2.9*   CBG: No results for input(s): GLUCAP in the last 168 hours.  Discharge time spent: greater than 30 minutes.  Signed: Delon Herald, MD [facility] 01/01/2024

## 2024-01-01 NOTE — TOC Transition Note (Signed)
 Transition of Care [facility]) - Discharge Note   Patient Details  Name: [name] MRN: [id number] Date of Birth: 12/11/1940  Transition of Care [facility]) CM/SW Contact:  Sheri ONEIDA Sharps, LCSW Phone Number: 01/01/2024, 11:20 AM   Clinical Narrative:    Pt medically ready to dc to [facility] for SNF. Call report [phone] ext [phone] [facility] [address]. DC packet w/ signed DNR left at nurses station. PTAR called at 10:15am. No further TOC needs.   Final next level of care: Skilled Nursing Facility Barriers to Discharge: Barriers Resolved   Patient Goals and CMS Choice Patient states their goals for this hospitalization and ongoing recovery are:: return to ALF following STR CMS Medicare.gov Compare Post Acute Care list provided to:: Patient Choice offered to / list presented to : Patient [place] ownership interest in [facility]provided to:: Patient    Discharge Placement PASRR number recieved: 12/30/23            Patient chooses bed at: [facility] Patient to be transferred to facility by: PTAR Name of family member notified: Ghanem,[name] (Spouse)  [phone] (Mobile) Patient and family notified of of transfer: 01/01/24  Discharge Plan and Services Additional resources added to the After Visit Summary for     Discharge Planning Services: CM Consult            DME Arranged: N/A DME Agency: NA       HH Arranged: NA HH Agency: NA        Social Drivers of Health (SDOH) Interventions SDOH Screenings   Food Insecurity: No Food Insecurity (12/28/2023)  Housing: Low Risk  (12/28/2023)  Transportation Needs: No Transportation Needs (12/28/2023)  Utilities: Not At Risk (12/28/2023)  Alcohol  Screen: Low Risk  (04/04/2023)  Depression (PHQ2-9): Low Risk  (10/24/2023)  Financial Resource Strain: Low Risk  (04/04/2023)  Physical Activity: Insufficiently Active (04/04/2023)  Social Connections: Socially Integrated (12/28/2023)  Stress: No Stress Concern  Present (04/04/2023)  Tobacco Use: Medium Risk (12/28/2023)  Health Literacy: Adequate Health Literacy (04/04/2023)     Readmission Risk Interventions    01/01/2024   11:17 AM 11/19/2023   11:31 AM 04/29/2023   11:10 AM  Readmission Risk Prevention Plan  Transportation Screening Complete Complete Complete  PCP or Specialist Appt within 5-7 Days  Complete   PCP or Specialist Appt within 3-5 Days Complete  Complete  Home Care Screening  Complete   Medication Review (RN CM)  Complete   HRI or Home Care Consult Complete  Complete  Social Work Consult for Recovery Care Planning/Counseling Complete  Complete  Palliative Care Screening Not Applicable  Not Applicable  Medication Review Oceanographer) Complete  Complete

## 2024-01-01 NOTE — Progress Notes (Signed)
 Attempted to call report x3 to [facility]. Spoke with receptionist who transferred me x3. I received the voice mail each time. Left VM. Call back number given to transport for facility.  AVS printed and placed in packet. All belongings sent with patient. IV removed. VSS. RA. Pt discharged.

## 2024-01-01 NOTE — Progress Notes (Signed)
 Report given to Robin,LPN at [facility]. All questions asked and answered. Reported that patient is discharged and left the unit.

## 2024-01-01 NOTE — Plan of Care (Signed)
  Problem: Clinical Measurements: Goal: Ability to maintain clinical measurements within normal limits will improve Outcome: Progressing Goal: Diagnostic test results will improve Outcome: Progressing Goal: Respiratory complications will improve Outcome: Progressing Goal: Cardiovascular complication will be avoided Outcome: Progressing   Problem: Elimination: Goal: Will not experience complications related to bowel motility Outcome: Progressing

## 2024-01-01 NOTE — Care Management Important Message (Signed)
 Important Message  Patient Details IM Letter given. Name: GENNELL HOW MRN: [id number] Date of Birth: April 02, 1941   Important Message Given:  Yes - Medicare IM     Melba Ates 01/01/2024, 9:54 AM

## 2024-01-05 ENCOUNTER — Encounter: Payer: Self-pay | Admitting: Nurse Practitioner

## 2024-01-05 ENCOUNTER — Non-Acute Institutional Stay (SKILLED_NURSING_FACILITY): Admitting: Nurse Practitioner

## 2024-01-05 DIAGNOSIS — F339 Major depressive disorder, recurrent, unspecified: Secondary | ICD-10-CM

## 2024-01-05 DIAGNOSIS — Z8719 Personal history of other diseases of the digestive system: Secondary | ICD-10-CM | POA: Diagnosis not present

## 2024-01-05 DIAGNOSIS — M810 Age-related osteoporosis without current pathological fracture: Secondary | ICD-10-CM

## 2024-01-05 DIAGNOSIS — G2581 Restless legs syndrome: Secondary | ICD-10-CM | POA: Diagnosis not present

## 2024-01-05 DIAGNOSIS — I5032 Chronic diastolic (congestive) heart failure: Secondary | ICD-10-CM | POA: Diagnosis not present

## 2024-01-05 DIAGNOSIS — E785 Hyperlipidemia, unspecified: Secondary | ICD-10-CM

## 2024-01-05 DIAGNOSIS — M150 Primary generalized (osteo)arthritis: Secondary | ICD-10-CM | POA: Diagnosis not present

## 2024-01-05 DIAGNOSIS — N3946 Mixed incontinence: Secondary | ICD-10-CM

## 2024-01-05 DIAGNOSIS — I10 Essential (primary) hypertension: Secondary | ICD-10-CM | POA: Diagnosis not present

## 2024-01-05 DIAGNOSIS — K219 Gastro-esophageal reflux disease without esophagitis: Secondary | ICD-10-CM

## 2024-01-05 DIAGNOSIS — D509 Iron deficiency anemia, unspecified: Secondary | ICD-10-CM

## 2024-01-05 DIAGNOSIS — J418 Mixed simple and mucopurulent chronic bronchitis: Secondary | ICD-10-CM

## 2024-01-05 NOTE — Assessment & Plan Note (Signed)
 Compensated clinically,  mild edema BLE, taking Torsemide , Bun/creat 5/0.34 12/31/23

## 2024-01-05 NOTE — Assessment & Plan Note (Signed)
 takes Pantoprazole  bid. Followed by GI

## 2024-01-05 NOTE — Transitions of Care (Post Inpatient/ED Visit) (Signed)
 Patient ID: [name], female   DOB: 10/29/1940, 83 y.o.   MRN: [id number]  01/05/2024 [facility] RN reviewed chart and noted patient discharged to SNF. TOC outreach is not indicated Shona Lauro PEAK, CCM [place]  VBCI-Population Health RN Care Manager [phone]

## 2024-01-05 NOTE — Assessment & Plan Note (Addendum)
 on Oxybutynin , reported fatigue and urinary frequency, denied burning sensation upon urination, urinary urgency, lower abdomen/back discomfort.  The patient is a afebrile. UA C/S to rule out UTI The patient agreed to reduce Torsemide /Kcl to every other day after R vs B reviewed her initial request to be off Torsemide .

## 2024-01-05 NOTE — Assessment & Plan Note (Signed)
takes Fosamax, Vit D

## 2024-01-05 NOTE — Assessment & Plan Note (Signed)
Stable, O2 at night, takes Ellipta, Claritin, prn Albuterol

## 2024-01-05 NOTE — Assessment & Plan Note (Signed)
 Her mood is a stable, takes Sertraline 

## 2024-01-05 NOTE — Progress Notes (Signed)
 Location:  [facility]ing Home Room Number: [id number] Place of Service:  SNF (31) Provider: Ramey Ketcherside X, NP  Gil Greig BRAVO, NP  Patient Care Team: Gil Greig BRAVO, NP as PCP - General (Adult Health Nurse Practitioner) Charlanne Fredia CROME, MD as Consulting Physician (Internal Medicine) Apickup-Ot, A, OT as Occupational Therapist (Occupational Therapy)  Extended Emergency Contact Information Primary Emergency Contact: Tibbitts,[name] Address: [address]          [place], [place] [address] [place] Home Phone: [phone] Mobile Phone: [phone] Relation: Spouse Secondary Emergency Contact: Desrocher MD,[name] Ozell Crane Phone: [phone] Relation: Son Interpreter needed? No  Code Status:  DNR Goals of care: Advanced Directive information    01/05/2024   11:26 AM  Advanced Directives  Does Patient Have a Medical Advance Directive? Yes  Type of Advance Directive Out of facility DNR (pink MOST or yellow form)  Does patient want to make changes to medical advance directive? No - Patient declined     Chief Complaint  Patient presents with   Acute Visit    Patient is experiencing fatigue    HPI:  Pt is a 83 y.o. female seen today for an acute visit for reported fatigue and urinary frequency, denied burning sensation upon urination, urinary urgency, lower abdomen/back discomfort.  The patient is a afebrile.  Hospitalized 12/27/2023 to 01/01/2024 for lower GI bleeding, CTA with actively bleeding in the descending colon C/W acute diverticular bleeding.  GI was consulted and IR performed embolization.  Status post 2 units PRBC 2/2 Hgb 6.4 Hospitalized 11/18/23-11/26/23 for lower GI bleed, hx of diverticulosis with bleeding, Hx of multiple GI bleed, 09/2023 Colonoscopy/ EGD showed gastritis and ischemic colitis,  transfused due to Hgb 5.3.               GERD, takes Pantoprazole  bid. 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, takes Ellipta, Claritin , prn Albuterol              Pulmonary nodule pretracheal lymph node 1.3x1.2 cm CT chest 08/09/21             HTN takes Torsemide              HLD takes Zocor              Anxiety/depression, takes Sertraline              OP takes Fosamax, Vit D             OA multiple sites, chronic right shoulder/R arm pain(rotator cuff injury), takes Tylenol              RLS, takes Requip              Urinary incontinence, on Oxybutynin              CHF mild edema BLE, taking Torsemide , Bun/creat 5/0.34 12/31/23    Past Medical History:  Diagnosis Date   Anxiety    Arthritis    bilateral knees, shoulders, elbows; neck, pretty widespread (09/05/2017)   BPPV (benign paroxysmal positional vertigo)    Depression    GERD (gastroesophageal reflux disease)    Glaucoma, both eyes    Headache    probably 2/month (09/05/2017)   History of blood transfusion ~ 2008   related to LGIB  Hyperlipemia    Lower GI bleeding ~ 2008; 09/05/2017   had to have blood transfusion   Macular degeneration, bilateral    Osteopenia    Seasonal allergies    Skin cancer, 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, left side   Sleeping excessive    Tinnitus of both ears    Past Surgical History:  Procedure Laterality Date   BALLOON DILATION N/A 05/08/2021   Procedure: BALLOON DILATION;  Surgeon: Saintclair Jasper, MD;  Location: [facility] ENDOSCOPY;  Service: Gastroenterology;  Laterality: N/A;   BASAL CELL CARCINOMA EXCISION  2001   off my nose, left side   BIOPSY  05/08/2021   Procedure: BIOPSY;  Surgeon: Saintclair Jasper, MD;  Location: [facility] ENDOSCOPY;  Service: Gastroenterology;;   BIOPSY  04/25/2023   Procedure: BIOPSY;  Surgeon: Dianna Specking, MD;  Location: [facility] ENDOSCOPY;  Service: Gastroenterology;;   BLEPHAROPLASTY Bilateral    BONE BIOPSY  10/26/2023   Procedure: BIOPSY, GI;  Surgeon: Elicia Claw, MD;  Location: [facility] ENDOSCOPY;  Service: Gastroenterology;;   CATARACT  EXTRACTION W/ INTRAOCULAR LENS  IMPLANT, BILATERAL Bilateral 1990's   COLONOSCOPY N/A 10/26/2023   Procedure: COLONOSCOPY;  Surgeon: Elicia Claw, MD;  Location: [facility] ENDOSCOPY;  Service: Gastroenterology;  Laterality: N/A;   COLONOSCOPY WITH PROPOFOL  N/A 05/04/2022   Procedure: COLONOSCOPY WITH PROPOFOL ;  Surgeon: Saintclair Jasper, MD;  Location: [facility] ENDOSCOPY;  Service: Gastroenterology;  Laterality: N/A;   COLONOSCOPY WITH PROPOFOL  N/A 04/25/2023   Procedure: COLONOSCOPY WITH PROPOFOL ;  Surgeon: Dianna Specking, MD;  Location: [facility] ENDOSCOPY;  Service: Gastroenterology;  Laterality: N/A;   ENTEROSCOPY N/A 11/20/2023   Procedure: ENTEROSCOPY;  Surgeon: Dianna Specking, MD;  Location: [facility] ENDOSCOPY;  Service: Gastroenterology;  Laterality: N/A;   ESOPHAGOGASTRODUODENOSCOPY N/A 10/26/2023   Procedure: EGD (ESOPHAGOGASTRODUODENOSCOPY);  Surgeon: Elicia Claw, MD;  Location: [facility] ENDOSCOPY;  Service: Gastroenterology;  Laterality: N/A;   ESOPHAGOGASTRODUODENOSCOPY N/A 11/20/2023   Procedure: EGD (ESOPHAGOGASTRODUODENOSCOPY);  Surgeon: Dianna Specking, MD;  Location: [facility] ENDOSCOPY;  Service: Gastroenterology;  Laterality: N/A;   ESOPHAGOGASTRODUODENOSCOPY (EGD) WITH PROPOFOL  N/A 05/08/2021   Procedure: ESOPHAGOGASTRODUODENOSCOPY (EGD) WITH PROPOFOL ;  Surgeon: Saintclair Jasper, MD;  Location: [facility] ENDOSCOPY;  Service: Gastroenterology;  Laterality: N/A;   ESOPHAGOGASTRODUODENOSCOPY (EGD) WITH PROPOFOL  N/A 01/11/2022   Procedure: ESOPHAGOGASTRODUODENOSCOPY (EGD) WITH PROPOFOL ;  Surgeon: Rosalie Kitchens, MD;  Location: [facility] ENDOSCOPY;  Service: Gastroenterology;  Laterality: N/A;   ESOPHAGOGASTRODUODENOSCOPY (EGD) WITH PROPOFOL  N/A 01/06/2023   Procedure: ESOPHAGOGASTRODUODENOSCOPY (EGD) WITH PROPOFOL ;  Surgeon: Rosalie Kitchens, MD;  Location: [facility] ENDOSCOPY;  Service: Gastroenterology;  Laterality: N/A;   ESOPHAGOGASTRODUODENOSCOPY (EGD) WITH PROPOFOL  N/A 04/21/2023   Procedure: ESOPHAGOGASTRODUODENOSCOPY (EGD) WITH PROPOFOL ;   Surgeon: Dianna Specking, MD;  Location: [facility] ENDOSCOPY;  Service: Gastroenterology;  Laterality: N/A;   EYE SURGERY Bilateral    to improve vision after cataract OR   GIVENS CAPSULE STUDY N/A 04/21/2023   Procedure: GIVENS CAPSULE STUDY;  Surgeon: Dianna Specking, MD;  Location: [facility] ENDOSCOPY;  Service: Gastroenterology;  Laterality: N/A;   GIVENS CAPSULE STUDY N/A 11/18/2023   Procedure: IMAGING PROCEDURE, GI TRACT, INTRALUMINAL, VIA CAPSULE;  Surgeon: Dianna Specking, MD;  Location: [facility] ENDOSCOPY;  Service: Gastroenterology;  Laterality: N/A;   HEMOSTASIS CLIP PLACEMENT  10/26/2023   Procedure: CONTROL OF HEMORRHAGE, GI TRACT, ENDOSCOPIC, BY CLIPPING OR OVERSEWING;  Surgeon: Elicia Claw, MD;  Location: [facility] ENDOSCOPY;  Service: Gastroenterology;;   IR ANGIOGRAM FOLLOW UP STUDY  12/19/2020   IR ANGIOGRAM SELECTIVE EACH ADDITIONAL VESSEL  12/19/2020   IR ANGIOGRAM SELECTIVE EACH ADDITIONAL VESSEL  12/19/2020   IR ANGIOGRAM SELECTIVE EACH ADDITIONAL VESSEL  12/28/2023   IR ANGIOGRAM VISCERAL SELECTIVE  12/19/2020   IR ANGIOGRAM VISCERAL SELECTIVE  12/28/2023   IR EMBO ART  VEN HEMORR LYMPH EXTRAV  INC GUIDE ROADMAPPING  12/19/2020   IR EMBO ART  VEN HEMORR LYMPH EXTRAV  INC GUIDE ROADMAPPING  12/28/2023   IR US  GUIDE VASC ACCESS RIGHT  12/19/2020   IR US  GUIDE VASC ACCESS RIGHT  12/28/2023   JOINT REPLACEMENT     POLYPECTOMY  05/04/2022   Procedure: POLYPECTOMY;  Surgeon: Saintclair Jasper, MD;  Location: [facility] ENDOSCOPY;  Service: Gastroenterology;;   POLYPECTOMY  10/26/2023   Procedure: POLYPECTOMY, INTESTINE;  Surgeon: Elicia Claw, MD;  Location: [facility] ENDOSCOPY;  Service: Gastroenterology;;   STAPEDES SURGERY Left    scraped stapedes because it was sticking when it wasn't suppose to   TONSILLECTOMY AND ADENOIDECTOMY  1946   TOTAL KNEE ARTHROPLASTY Left ~ 2008   TOTAL KNEE ARTHROPLASTY  12/20/2011   Procedure: TOTAL KNEE ARTHROPLASTY;  Surgeon: Elspeth JONELLE Her, MD;  Location: [facility] OR;  Service:  Orthopedics;  Laterality: Right;  Right Total Knee Arthroplasty   TUBAL LIGATION  1980's    Allergies  Allergen Reactions   Morphine And Codeine Itching   Aspirin Other (See Comments)    Unknown reaction   Duloxetine  Hcl Other (See Comments)    Unknown reaction   Tymlos [Abaloparatide] Other (See Comments)    Unknown reaction   Ventolin  [Albuterol ] Other (See Comments)    rapid heart beat   Cefadroxil Hives    Patient can take amoxicillin  and cipro     Allergies as of 01/05/2024       Reactions   Morphine And Codeine Itching   Aspirin Other (See Comments)   Unknown reaction   Duloxetine  Hcl Other (See Comments)   Unknown reaction   Tymlos [abaloparatide] Other (See Comments)   Unknown reaction   Ventolin  [albuterol ] Other (See Comments)   rapid heart beat   Cefadroxil Hives   Patient can take amoxicillin  and cipro         Medication List        Accurate as of January 05, 2024  3:25 PM. If you have any questions, ask your nurse or doctor.          acetaminophen  650 MG CR tablet Commonly known as: TYLENOL  Take 1,300 mg by mouth in the morning and at bedtime.   albuterol  108 (90 Base) MCG/ACT inhaler Commonly known as: VENTOLIN  HFA Inhale 2 puffs into the lungs every 4 (four) hours as needed for wheezing or shortness of breath.   alendronate 70 MG tablet Commonly known as: FOSAMAX Take 70 mg by mouth every Sunday. Take with a full glass of water on an empty stomach.   Align 4 MG Caps Take 4 mg by mouth at bedtime.   Anoro Ellipta  62.5-25 MCG/ACT Aepb Generic drug: umeclidinium-vilanterol Inhale 1 puff into the lungs every morning.   cetirizine  5 MG chewable tablet Commonly known as: ZYRTEC  Chew 5 mg by mouth daily as needed for allergies.   cyanocobalamin  1000 MCG tablet Commonly known as: VITAMIN B12 Take 1,000 mcg by mouth daily.   Dorzolamide  HCl-Timolol  Mal PF 2-0.5 % Soln Place 1 drop into both eyes in the morning and at bedtime.   ferrous  gluconate 240 (27 FE) MG tablet Commonly known as: FERGON Take 240 mg by mouth every morning. Take without food   latanoprost  0.005 % ophthalmic solution Commonly known as:  XALATAN  Place 1 drop into both eyes at bedtime.   oxybutynin  10 MG 24 hr tablet Commonly known as: DITROPAN -XL Take 10 mg by mouth at bedtime.   pantoprazole  40 MG tablet Commonly known as: PROTONIX  Take 1 tablet (40 mg total) by mouth 2 (two) times daily before a meal.   potassium chloride  20 MEQ/15ML (10%) Soln Take 15 mLs by mouth daily.   PreserVision AREDS 2 Caps Take 1 capsule by mouth 2 (two) times daily.   PreviDent 5000 Booster Plus 1.1 % Pste Generic drug: Sodium Fluoride  Place 1 Application onto teeth See admin instructions. Brush on teeth with a toothbrush after evening mouth care. Spit out excess and do not rinse.   Rhopressa  0.02 % Soln Generic drug: Netarsudil  Dimesylate Place 1 drop into both eyes at bedtime.   rOPINIRole  0.5 MG tablet Commonly known as: REQUIP  Take 1 tablet (0.5 mg total) by mouth daily as needed.   senna 8.6 MG Tabs tablet Commonly known as: SENOKOT Take 2 tablets by mouth at bedtime.   sertraline  50 MG tablet Commonly known as: ZOLOFT  Take 1.5 tablets (75 mg total) by mouth daily.   simvastatin  20 MG tablet Commonly known as: ZOCOR  Take 20 mg by mouth every evening.   torsemide  20 MG tablet Commonly known as: DEMADEX  Take 20 mg by mouth daily.   traMADol  HCl 25 MG Tabs Take 25 mg by mouth 2 (two) times daily.   vitamin C  1000 MG tablet Take 1,000 mg by mouth every morning.   Vitamin D3 50 MCG (2000 UT) Tabs Take 2,000 Units by mouth every morning.        Review of Systems  Constitutional:  Negative for activity change, fatigue and fever.  HENT:  Positive for hearing loss. Negative for congestion and trouble swallowing.   Eyes:  Negative for visual disturbance.  Respiratory:  Negative for cough, shortness of breath and wheezing.    Cardiovascular:  Positive for leg swelling.  Gastrointestinal:  Negative for abdominal pain, blood in stool and constipation.  Genitourinary:  Positive for frequency. Negative for difficulty urinating, dysuria, flank pain and urgency.  Musculoskeletal:  Positive for arthralgias and gait problem.  Skin:  Negative for color change.  Neurological:  Negative for tremors and headaches.  Psychiatric/Behavioral:  Negative for behavioral problems and sleep disturbance. The patient is not nervous/anxious.     Immunization History  Administered Date(s) Administered   Dtap, Unspecified 03/12/2018   Fluad Quad(high Dose 65+) 04/24/2022   Fluad Trivalent(High Dose 65+) 04/27/2023   Fluzone Influenza virus vaccine,trivalent (IIV3), split virus 09/22/2009, 03/29/2010, 07/23/2011, 04/03/2012, 03/30/2014   Influenza Inj Mdck Quad Pf 04/12/2017   Influenza, High Dose Seasonal PF 04/15/2013, 03/14/2018   Influenza, Mdck, Trivalent,PF 6+ MOS(egg free) 03/16/2016, 04/12/2017   Influenza, Quadrivalent, Recombinant, Inj, Pf 03/05/2019, 05/22/2021   Influenza,inj,Quad PF,6+ Mos 03/30/2014, 03/25/2015   PFIZER(Purple Top)SARS-COV-2 Vaccination 07/25/2019, 08/16/2019, 03/31/2020, 03/31/2021, 12/12/2021   Pneumococcal Conjugate-13 10/19/2013, 11/01/2014   Pneumococcal Polysaccharide-23 09/22/2009, 05/01/2010, 07/23/2011   Pneumococcal-Unspecified 03/02/2011   Td (Adult),5 Lf Tetanus Toxid, Preservative Free 07/23/2011   Tdap 09/22/2009, 07/23/2011, 03/12/2018   Zoster Recombinant(Shingrix) 10/08/2017, 12/09/2017   Zoster, Live 09/22/2009, 07/23/2011, 12/16/2017   Pertinent  Health Maintenance Due  Topic Date Due   DEXA SCAN  Never done   INFLUENZA VACCINE  01/30/2024      10/30/2022   12:44 PM 01/22/2023    1:15 PM 04/04/2023   11:00 AM 07/10/2023    9:02 AM 10/24/2023    2:27  PM  Fall Risk  Falls in the past year? 0 0 0 0 0  Was there an injury with Fall? 0 0 0 0 0  Fall Risk Category Calculator 0 0 0 0  0  Patient at Risk for Falls Due to History of fall(s);Impaired balance/gait;Impaired mobility History of fall(s);Impaired balance/gait History of fall(s);Impaired balance/gait;Impaired mobility History of fall(s);Impaired balance/gait;Impaired mobility History of fall(s);Impaired balance/gait;Impaired vision  Fall risk Follow up Falls evaluation completed;Education provided;Falls prevention discussed Falls evaluation completed;Education provided;Falls prevention discussed Falls evaluation completed;Education provided;Falls prevention discussed Falls evaluation completed;Education provided;Falls prevention discussed Falls evaluation completed;Education provided   Functional Status Survey:    Vitals:   01/05/24 1111  BP: 139/74  Pulse: 88  Resp: 20  Temp: (!) 97.3 F (36.3 C)  SpO2: 97%  Weight: 157 lb 1.6 oz (71.3 kg)   Body mass index is 29.68 kg/m. Physical Exam Nursing note reviewed.  Constitutional:      Appearance: Normal appearance.  HENT:     Head: Normocephalic and atraumatic.     Nose: Nose normal.  Eyes:     Extraocular Movements: Extraocular movements intact.     Conjunctiva/sclera: Conjunctivae normal.     Pupils: Pupils are equal, round, and reactive to light.  Cardiovascular:     Rate and Rhythm: Normal rate and regular rhythm.     Heart sounds: No murmur heard. Pulmonary:     Effort: Pulmonary effort is normal.  Abdominal:     General: Bowel sounds are normal. There is no distension.     Palpations: Abdomen is soft.     Tenderness: There is no abdominal tenderness. There is no right CVA tenderness, left CVA tenderness, guarding or rebound.  Musculoskeletal:        General: Tenderness present. No swelling, deformity or signs of injury. Normal range of motion.     Cervical back: Normal range of motion and neck supple.     Right lower leg: Edema present.     Left lower leg: Edema present.     Comments: Trace edema BLE Chronic R shoulder/arm pain, full ROM, hx  of rotator cuff injury Left groin to thigh to ankle pain with therapy, straight leg raise, able to weight bearing and walking-improved.   Skin:    General: Skin is warm and dry.  Neurological:     General: No focal deficit present.     Mental Status: She is alert and oriented to person, place, and time. Mental status is at baseline.     Gait: Gait abnormal.  Psychiatric:        Mood and Affect: Mood normal.        Behavior: Behavior normal.        Thought Content: Thought content normal.        Judgment: Judgment normal.     Labs reviewed: Recent Labs    11/18/23 1025 11/19/23 0302 11/19/23 0904 11/20/23 0254 11/21/23 0310 12/28/23 0411 12/29/23 0247 12/30/23 0324 12/31/23 0533  NA 138   < >  --  141   < > 140 139 139 140  K 3.0*   < >  --  3.8   < > 3.4* 3.6 3.4* 3.7  CL 105   < >  --  112*   < > 107 109 109 111  CO2 25   < >  --  23   < > 25 23 23 24   GLUCOSE 148*   < >  --  130*   < >  119* 99 122* 118*  BUN 25*   < >  --  13   < > 10 9 7* <5*  CREATININE 0.57   < >  --  <0.30*   < > 0.45 0.36* 0.40* 0.34*  CALCIUM  7.4*   < >  --  7.9*   < > 7.7* 8.2* 8.1* 8.4*  MG 1.9  --   --  2.0   < > 1.9 2.5* 2.3  --   PHOS 2.6  --  2.2* 1.8*  --   --   --   --   --    < > = values in this interval not displayed.   Recent Labs    11/19/23 0302 12/27/23 1130 12/28/23 0411  AST 14* 23 20  ALT 8 8 9   ALKPHOS 29* 65 49  BILITOT 0.8 0.8 1.9*  PROT 4.5* 6.1* 4.9*  ALBUMIN 2.6* 3.5 2.9*   Recent Labs    12/18/23 0000 12/27/23 1130 12/27/23 1139 12/28/23 0411 12/28/23 0843 12/29/23 1723 12/30/23 0324 12/31/23 0533  WBC 4.3 5.8  --  8.5   < > 6.7 4.8 4.5  NEUTROABS 2,137.00 4.0  --  7.1  --   --   --   --   HGB 11.0* 9.4*   < > 8.2*   < > 7.6* 7.1* 8.5*  HCT 35* 31.2*   < > 26.7*   < > 24.8* 23.8* 27.9*  MCV  --  99.7  --  97.4   < > 98.8 101.3* 98.2  PLT 184 170  --  121*   < > 145* 135* 160   < > = values in this interval not displayed.   Lab Results  Component  Value Date   TSH 2.47 10/08/2022   No results found for: HGBA1C Lab Results  Component Value Date   CHOL 125 07/14/2023   HDL 51 07/14/2023   LDLCALC 53 07/14/2023   TRIG 130 07/14/2023    Significant Diagnostic Results in last 30 days:  IR EMBO ART  VEN HEMORR LYMPH EXTRAV  INC GUIDE ROADMAPPING Result Date: 12/28/2023 INDICATION: Diverticular bleed.  Bright red blood per rectum. EXAM: Title; MESENTERIC ARTERIOGRAPHY and COIL EMBOLIZATION for LOWER GI BLEED Listed procedures; 1. ULTRASOUND GUIDANCE for RIGHT COMMON FEMORAL ARTERIAL ACCESS 2. INFERIOR MESENTERIC ARTERY ARTERIOGRAPHY 3. COIL EMBOLIZATION of a DISTAL LEFT COLIC BRANCH VESSEL at iDESCENDING COLON COMPARISON:  CTA AP, 12/27/2023 and 04/19/2023 MEDICATIONS: None ANESTHESIA/SEDATION: Moderate (conscious) sedation was employed during this procedure. A total of Versed  4 mg and Fentanyl  100 mcg was administered intravenously. Moderate Sedation Time: 60 minutes. The patient's level of consciousness and vital signs were monitored continuously by radiology nursing throughout the procedure under my direct supervision. CONTRAST:  45 mL Omnipaque  300 FLUOROSCOPY: Radiation Exposure Index and estimated peak skin dose (PSD); Reference air kerma (RAK), 211 mGy. COMPLICATIONS: None immediate. PROCEDURE: Informed consent was obtained from the patient and/or patient's representative following explanation of the procedure, risks, benefits and alternatives. All questions were addressed. A time out was performed prior to the initiation of the procedure. Maximal barrier sterile technique utilized including caps, mask, sterile gowns, sterile gloves, large sterile drape, hand hygiene, and Betadine prep. The RIGHT femoral head was marked fluoroscopically. Under sterile conditions and local anesthesia, the RIGHT common femoral artery access was performed with a micropuncture needle. Under direct ultrasound guidance, the RIGHT common femoral was accessed with a  micropuncture kit. An ultrasound image was saved for documentation purposes. This  allowed for placement of a 5 Fr vascular sheath. A limited arteriogram was performed through the side arm of the sheath confirming appropriate access within the RIGHT common femoral artery. Over a Bentson wire, a Mickelson catheter was advanced, reformed, back bled and flushed. The catheter was then utilized to select the inferior mesenteric artery and a selective inferior mesenteric arteriogram was performed. Using a 2.1 Fr Progreat alpha microcatheter and microwire, access into the distal LEFT colic artery branch vessels was performed and selective arteriography was obtained to identify the bleeding vessel. Super selective coil embolization was then performed using a single non detachable micro coil. A post embolization arteriogram was performed without evidence of residual extravasation. Images were reviewed and the procedure was terminated. All wires, catheters and sheaths were removed from the patient. Hemostasis was achieved at the RIGHT groin access site with Angio-Seal closure device. The patient tolerated the procedure well without immediate post procedural complication. FINDINGS: - access via the RIGHT femoral artery. - No overt extravasation at the descending colon with suspected ooze from IMA territory, distal LEFT colic branch vessel - Successful embolization of the target vessel with a single micro coil - Angio-Seal closure at RIGHT groin. Palpable RLE pulses at the end of the Case IMPRESSION: Successful super-selective coil embolization of a target vessel at site of ooze from IMA territory, LEFT colic branch vessel, as above. PLAN: - The patient is to remain flat for 2 hours with RIGHT leg straight. - The patient may continue to experience several additional bloody bowel movements however should clear the rectal vault in the coming days. Thom Hall, MD [facility]  Radiology Electronically Signed   By: Thom Hall M.D.   On: 12/28/2023 11:15   IR Angiogram Visceral Selective Result Date: 12/28/2023 INDICATION: Diverticular bleed.  Bright red blood per rectum. EXAM: Title; MESENTERIC ARTERIOGRAPHY and COIL EMBOLIZATION for LOWER GI BLEED Listed procedures; 1. ULTRASOUND GUIDANCE for RIGHT COMMON FEMORAL ARTERIAL ACCESS 2. INFERIOR MESENTERIC ARTERY ARTERIOGRAPHY 3. COIL EMBOLIZATION of a DISTAL LEFT COLIC BRANCH VESSEL at iDESCENDING COLON COMPARISON:  CTA AP, 12/27/2023 and 04/19/2023 MEDICATIONS: None ANESTHESIA/SEDATION: Moderate (conscious) sedation was employed during this procedure. A total of Versed  4 mg and Fentanyl  100 mcg was administered intravenously. Moderate Sedation Time: 60 minutes. The patient's level of consciousness and vital signs were monitored continuously by radiology nursing throughout the procedure under my direct supervision. CONTRAST:  45 mL Omnipaque  300 FLUOROSCOPY: Radiation Exposure Index and estimated peak skin dose (PSD); Reference air kerma (RAK), 211 mGy. COMPLICATIONS: None immediate. PROCEDURE: Informed consent was obtained from the patient and/or patient's representative following explanation of the procedure, risks, benefits and alternatives. All questions were addressed. A time out was performed prior to the initiation of the procedure. Maximal barrier sterile technique utilized including caps, mask, sterile gowns, sterile gloves, large sterile drape, hand hygiene, and Betadine prep. The RIGHT femoral head was marked fluoroscopically. Under sterile conditions and local anesthesia, the RIGHT common femoral artery access was performed with a micropuncture needle. Under direct ultrasound guidance, the RIGHT common femoral was accessed with a micropuncture kit. An ultrasound image was saved for documentation purposes. This allowed for placement of a 5 Fr vascular sheath. A limited arteriogram was performed through the side arm of the  sheath confirming appropriate access within the RIGHT common femoral artery. Over a Bentson wire, a Mickelson catheter was advanced, reformed, back bled and flushed. The catheter was then utilized to select the inferior mesenteric artery and a  selective inferior mesenteric arteriogram was performed. Using a 2.1 Fr Progreat alpha microcatheter and microwire, access into the distal LEFT colic artery branch vessels was performed and selective arteriography was obtained to identify the bleeding vessel. Super selective coil embolization was then performed using a single non detachable micro coil. A post embolization arteriogram was performed without evidence of residual extravasation. Images were reviewed and the procedure was terminated. All wires, catheters and sheaths were removed from the patient. Hemostasis was achieved at the RIGHT groin access site with Angio-Seal closure device. The patient tolerated the procedure well without immediate post procedural complication. FINDINGS: - access via the RIGHT femoral artery. - No overt extravasation at the descending colon with suspected ooze from IMA territory, distal LEFT colic branch vessel - Successful embolization of the target vessel with a single micro coil - Angio-Seal closure at RIGHT groin. Palpable RLE pulses at the end of the Case IMPRESSION: Successful super-selective coil embolization of a target vessel at site of ooze from IMA territory, LEFT colic branch vessel, as above. PLAN: - The patient is to remain flat for 2 hours with RIGHT leg straight. - The patient may continue to experience several additional bloody bowel movements however should clear the rectal vault in the coming days. Thom Hall, MD [facility] Radiology Electronically Signed   By: Thom Hall M.D.   On: 12/28/2023 11:15   IR Angiogram Selective Each Additional Vessel Result Date: 12/28/2023 INDICATION: Diverticular bleed.  Bright red  blood per rectum. EXAM: Title; MESENTERIC ARTERIOGRAPHY and COIL EMBOLIZATION for LOWER GI BLEED Listed procedures; 1. ULTRASOUND GUIDANCE for RIGHT COMMON FEMORAL ARTERIAL ACCESS 2. INFERIOR MESENTERIC ARTERY ARTERIOGRAPHY 3. COIL EMBOLIZATION of a DISTAL LEFT COLIC BRANCH VESSEL at iDESCENDING COLON COMPARISON:  CTA AP, 12/27/2023 and 04/19/2023 MEDICATIONS: None ANESTHESIA/SEDATION: Moderate (conscious) sedation was employed during this procedure. A total of Versed  4 mg and Fentanyl  100 mcg was administered intravenously. Moderate Sedation Time: 60 minutes. The patient's level of consciousness and vital signs were monitored continuously by radiology nursing throughout the procedure under my direct supervision. CONTRAST:  45 mL Omnipaque  300 FLUOROSCOPY: Radiation Exposure Index and estimated peak skin dose (PSD); Reference air kerma (RAK), 211 mGy. COMPLICATIONS: None immediate. PROCEDURE: Informed consent was obtained from the patient and/or patient's representative following explanation of the procedure, risks, benefits and alternatives. All questions were addressed. A time out was performed prior to the initiation of the procedure. Maximal barrier sterile technique utilized including caps, mask, sterile gowns, sterile gloves, large sterile drape, hand hygiene, and Betadine prep. The RIGHT femoral head was marked fluoroscopically. Under sterile conditions and local anesthesia, the RIGHT common femoral artery access was performed with a micropuncture needle. Under direct ultrasound guidance, the RIGHT common femoral was accessed with a micropuncture kit. An ultrasound image was saved for documentation purposes. This allowed for placement of a 5 Fr vascular sheath. A limited arteriogram was performed through the side arm of the sheath confirming appropriate access within the RIGHT common femoral artery. Over a Bentson wire, a Mickelson catheter was advanced, reformed, back bled and flushed. The catheter was then  utilized to select the inferior mesenteric artery and a selective inferior mesenteric arteriogram was performed. Using a 2.1 Fr Progreat alpha microcatheter and microwire, access into the distal LEFT colic artery branch vessels was performed and selective arteriography was obtained to identify the bleeding vessel. Super selective coil embolization was then performed using a single non detachable micro coil. A post embolization arteriogram was  performed without evidence of residual extravasation. Images were reviewed and the procedure was terminated. All wires, catheters and sheaths were removed from the patient. Hemostasis was achieved at the RIGHT groin access site with Angio-Seal closure device. The patient tolerated the procedure well without immediate post procedural complication. FINDINGS: - access via the RIGHT femoral artery. - No overt extravasation at the descending colon with suspected ooze from IMA territory, distal LEFT colic branch vessel - Successful embolization of the target vessel with a single micro coil - Angio-Seal closure at RIGHT groin. Palpable RLE pulses at the end of the Case IMPRESSION: Successful super-selective coil embolization of a target vessel at site of ooze from IMA territory, LEFT colic branch vessel, as above. PLAN: - The patient is to remain flat for 2 hours with RIGHT leg straight. - The patient may continue to experience several additional bloody bowel movements however should clear the rectal vault in the coming days. Thom Hall, MD [facility] Radiology Electronically Signed   By: Thom Hall M.D.   On: 12/28/2023 11:15   IR US  Guide Vasc Access Right Result Date: 12/28/2023 INDICATION: Diverticular bleed.  Bright red blood per rectum. EXAM: Title; MESENTERIC ARTERIOGRAPHY and COIL EMBOLIZATION for LOWER GI BLEED Listed procedures; 1. ULTRASOUND GUIDANCE for RIGHT COMMON FEMORAL ARTERIAL ACCESS 2. INFERIOR MESENTERIC  ARTERY ARTERIOGRAPHY 3. COIL EMBOLIZATION of a DISTAL LEFT COLIC BRANCH VESSEL at iDESCENDING COLON COMPARISON:  CTA AP, 12/27/2023 and 04/19/2023 MEDICATIONS: None ANESTHESIA/SEDATION: Moderate (conscious) sedation was employed during this procedure. A total of Versed  4 mg and Fentanyl  100 mcg was administered intravenously. Moderate Sedation Time: 60 minutes. The patient's level of consciousness and vital signs were monitored continuously by radiology nursing throughout the procedure under my direct supervision. CONTRAST:  45 mL Omnipaque  300 FLUOROSCOPY: Radiation Exposure Index and estimated peak skin dose (PSD); Reference air kerma (RAK), 211 mGy. COMPLICATIONS: None immediate. PROCEDURE: Informed consent was obtained from the patient and/or patient's representative following explanation of the procedure, risks, benefits and alternatives. All questions were addressed. A time out was performed prior to the initiation of the procedure. Maximal barrier sterile technique utilized including caps, mask, sterile gowns, sterile gloves, large sterile drape, hand hygiene, and Betadine prep. The RIGHT femoral head was marked fluoroscopically. Under sterile conditions and local anesthesia, the RIGHT common femoral artery access was performed with a micropuncture needle. Under direct ultrasound guidance, the RIGHT common femoral was accessed with a micropuncture kit. An ultrasound image was saved for documentation purposes. This allowed for placement of a 5 Fr vascular sheath. A limited arteriogram was performed through the side arm of the sheath confirming appropriate access within the RIGHT common femoral artery. Over a Bentson wire, a Mickelson catheter was advanced, reformed, back bled and flushed. The catheter was then utilized to select the inferior mesenteric artery and a selective inferior mesenteric arteriogram was performed. Using a 2.1 Fr Progreat alpha microcatheter and microwire, access into the distal LEFT  colic artery branch vessels was performed and selective arteriography was obtained to identify the bleeding vessel. Super selective coil embolization was then performed using a single non detachable micro coil. A post embolization arteriogram was performed without evidence of residual extravasation. Images were reviewed and the procedure was terminated. All wires, catheters and sheaths were removed from the patient. Hemostasis was achieved at the RIGHT groin access site with Angio-Seal closure device. The patient tolerated the procedure well without immediate post procedural complication. FINDINGS: - access via the RIGHT femoral  artery. - No overt extravasation at the descending colon with suspected ooze from IMA territory, distal LEFT colic branch vessel - Successful embolization of the target vessel with a single micro coil - Angio-Seal closure at RIGHT groin. Palpable RLE pulses at the end of the Case IMPRESSION: Successful super-selective coil embolization of a target vessel at site of ooze from IMA territory, LEFT colic branch vessel, as above. PLAN: - The patient is to remain flat for 2 hours with RIGHT leg straight. - The patient may continue to experience several additional bloody bowel movements however should clear the rectal vault in the coming days. Thom Hall, MD [facility] Radiology Electronically Signed   By: Thom Hall M.D.   On: 12/28/2023 11:15   CT ANGIO GI BLEED Result Date: 12/27/2023 CLINICAL DATA:  Rectal bleeding.  Left upper abdominal pain. EXAM: CTA ABDOMEN AND PELVIS WITHOUT AND WITH CONTRAST TECHNIQUE: Multidetector CT imaging of the abdomen and pelvis was performed using the standard protocol during bolus administration of intravenous contrast. Multiplanar reconstructed images and MIPs were obtained and reviewed to evaluate the vascular anatomy. RADIATION DOSE REDUCTION: This exam was performed according to the departmental  dose-optimization program which includes automated exposure control, adjustment of the mA and/or kV according to patient size and/or use of iterative reconstruction technique. CONTRAST:  OMNIPAQUE  IOHEXOL  350 MG/ML SOLN COMPARISON:  None Available. FINDINGS: VASCULAR Aorta: Atherosclerotic disease in the abdominal aorta without aneurysm or dissection or significant stenosis. Celiac: Patent without evidence of aneurysm, dissection, vasculitis or significant stenosis. SMA: Patent without evidence of aneurysm, dissection, vasculitis or significant stenosis. Renals: Single bilateral renal arteries. Left renal artery is widely patent without aneurysm, dissection or stenosis. At least mild stenosis at origin of the right renal artery without aneurysm or dissection. IMA: Patent without evidence of aneurysm, dissection, vasculitis or significant stenosis. 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 Inflow: Patent without evidence of aneurysm, dissection, vasculitis or significant stenosis. Proximal Outflow: Proximal femoral arteries are patent bilaterally. Veins: Portal venous system is patent. Normal appearance of the IVC and renal veins. Normal appearance of the iliac veins. Review of the MIP images confirms the above findings. NON-VASCULAR Lower chest: Peripheral reticular densities at the lung bases are suggestive for chronic changes. No pleural effusions. Hepatobiliary: Gallbladder is mildly distended with high-density sludge or small stones. No gallbladder inflammation. Main portal venous system is patent. No biliary dilatation. Pancreas: Unremarkable. No pancreatic ductal dilatation or surrounding inflammatory changes. Spleen: Normal in size without focal abnormality. Adrenals/Urinary Tract: Normal adrenal glands. No kidney stones. Cyst in the left kidney upper pole. No hydronephrosis. No suspicious renal lesion. Normal urinary bladder. Stomach/Bowel:  Positive for active GI bleeding involving the descending colon. 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 The area of arterial bleeding in the colon is distal to the previously embolized area. Extensive diverticular disease throughout the colon. Endoscopic clip in the right colon near the ileocecal valve. Normal appendix. Normal stomach. No bowel dilatation or obstruction. Lymphatic: No lymph node enlargement in the abdomen or pelvis. Reproductive: Uterus and bilateral adnexa are unremarkable. Other: Negative for ascites.  Negative for free air. Musculoskeletal: No acute bone abnormality. Old vertebral body compression fracture at L1. IMPRESSION: 1. Positive for active GI bleeding involving the descending colon. Findings are most compatible with a diverticular bleed. 2. 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  3. Extensive diverticular disease throughout the colon. 4. Gallbladder sludge or small stones. No gallbladder inflammation. 5. Aortic Atherosclerosis (ICD10-I70.0). These results were called by telephone at the time of interpretation on 12/27/2023 at 5:12 pm to provider Dr. Efrain, who verbally acknowledged these results. Electronically Signed   By: Juliene Balder M.D.   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  The patient is a afebrile. UA C/S to rule out UTI The patient agreed to reduce Torsemide /Kcl to every other day after R vs B reviewed her initial request to be off Torsemide .   History of GI diverticular bleed Hospitalized 12/27/2023 to 01/01/2024 for lower GI bleeding, CTA with actively bleeding in the descending colon C/W acute diverticular bleeding.  GI was consulted and IR performed embolization.  Status  post 2 units PRBC 2/2 Hgb 6.4 Hospitalized 11/18/23-11/26/23 for lower GI bleed, hx of diverticulosis with bleeding, Hx of multiple GI bleed, 09/2023 Colonoscopy/ EGD showed gastritis and ischemic colitis,  transfused due to Hgb 5.3.  Repeat CBC with differential   GERD (gastroesophageal reflux disease) takes Pantoprazole  bid. Followed by GI  Iron  deficiency anemia Hx of GI bleed, transfused,  takes Iron , Hgb 8.5 12/31/23 Repeat CBC/ differential  COPD (chronic obstructive pulmonary disease) (HCC) Stable, O2 at night, takes Ellipta, Claritin , prn Albuterol   Hypertension Blood pressure is controlled,  takes Torsemide   Hyperlipidemia  takes Zocor   Recurrent depression (HCC) Her mood is a stable, takes Sertraline   Senile osteoporosis  takes Fosamax, Vit D  Osteoarthritis, multiple sites chronic right shoulder/R arm pain(rotator cuff injury), takes Tylenol   Restless leg syndrome Managed, takes Requip   Chronic heart failure with preserved ejection fraction (HFpEF) (HCC) Compensated clinically,  mild edema BLE, taking Torsemide , Bun/creat 5/0.34 12/31/23     Family/ staff Communication: Plan of care reviewed with the patient and charge nurse  Labs/tests ordered:   UA and C&S

## 2024-01-05 NOTE — Assessment & Plan Note (Signed)
Blood pressure is controlled,  takes Torsemide °           °

## 2024-01-05 NOTE — Assessment & Plan Note (Signed)
 Hospitalized 12/27/2023 to 01/01/2024 for lower GI bleeding, CTA with actively bleeding in the descending colon C/W acute diverticular bleeding.  GI was consulted and IR performed embolization.  Status post 2 units PRBC 2/2 Hgb 6.4 Hospitalized 11/18/23-11/26/23 for lower GI bleed, hx of diverticulosis with bleeding, Hx of multiple GI bleed, 09/2023 Colonoscopy/ EGD showed gastritis and ischemic colitis,  transfused due to Hgb 5.3.  Repeat CBC with differential

## 2024-01-05 NOTE — Assessment & Plan Note (Signed)
 takes Zocor 

## 2024-01-05 NOTE — Assessment & Plan Note (Signed)
 Hx of GI bleed, transfused,  takes Iron , Hgb 8.5 12/31/23 Repeat CBC/ differential

## 2024-01-05 NOTE — Assessment & Plan Note (Signed)
 chronic right shoulder/R arm pain(rotator cuff injury), takes Tylenol 

## 2024-01-05 NOTE — Assessment & Plan Note (Signed)
 Managed, takes Requip 

## 2024-01-07 ENCOUNTER — Encounter: Payer: Self-pay | Admitting: Orthopedic Surgery

## 2024-01-07 ENCOUNTER — Other Ambulatory Visit: Payer: Self-pay | Admitting: Orthopedic Surgery

## 2024-01-07 ENCOUNTER — Non-Acute Institutional Stay (SKILLED_NURSING_FACILITY): Admitting: Orthopedic Surgery

## 2024-01-07 DIAGNOSIS — F339 Major depressive disorder, recurrent, unspecified: Secondary | ICD-10-CM | POA: Diagnosis not present

## 2024-01-07 DIAGNOSIS — N3281 Overactive bladder: Secondary | ICD-10-CM

## 2024-01-07 DIAGNOSIS — I10 Essential (primary) hypertension: Secondary | ICD-10-CM | POA: Diagnosis not present

## 2024-01-07 DIAGNOSIS — Z8719 Personal history of other diseases of the digestive system: Secondary | ICD-10-CM

## 2024-01-07 DIAGNOSIS — D500 Iron deficiency anemia secondary to blood loss (chronic): Secondary | ICD-10-CM | POA: Diagnosis not present

## 2024-01-07 DIAGNOSIS — I5032 Chronic diastolic (congestive) heart failure: Secondary | ICD-10-CM

## 2024-01-07 DIAGNOSIS — G2581 Restless legs syndrome: Secondary | ICD-10-CM

## 2024-01-07 DIAGNOSIS — J418 Mixed simple and mucopurulent chronic bronchitis: Secondary | ICD-10-CM | POA: Diagnosis not present

## 2024-01-07 NOTE — Progress Notes (Signed)
 Location:  [facility] Room Number: 66/B Place of Service:  SNF (31) Provider:  Greig FORBES Cluster, NP   Cluster Greig FORBES, NP  Patient Care Team: Cluster Greig FORBES, NP as PCP - General (Adult Health Nurse Practitioner) Charlanne Fredia CROME, MD as Consulting Physician (Internal Medicine) Apickup-Ot, A, OT as Occupational Therapist (Occupational Therapy)  Extended Emergency Contact Information Primary Emergency Contact: Schopf,[name] Address: [address]          [place], [place] [address] [place] Home Phone: [phone] Mobile Phone: [phone] Relation: Spouse Secondary Emergency Contact: Arbaugh MD,[name] Ozell Crane Phone: [phone] Relation: Son Interpreter needed? No  Code Status:  DNR Goals of care: Advanced Directive information    01/05/2024   11:26 AM  Advanced Directives  Does Patient Have a Medical Advance Directive? Yes  Type of Advance Directive Out of facility DNR (pink MOST or yellow form)  Does patient want to make changes to medical advance directive? No - Patient declined     Chief Complaint  Patient presents with   Discharge Note    HPI:  Pt is a 83 y.o. female seen today for discharge evaluation.   She currently resides on the rehab unit at [facility]. PMH: HTN, COPD, pulmonary nodule, Diverticulosis with GI bleed, GERD, OA, osteoporosis,macular degeneration, iron  def anemia, unstable gait, urinary incontinence, anxiety, and depression.   Hospitalized 06/28-07/03 due to recurrent GI bleed. 06/24 she had new onset hematochezia, 8-9 episodes in 1 day. Hgb on admission was 9.4 but trended down to 6.4 later on in the day. Hemoccult was positive. She was given 2 units PRBC. 06/29 she underwent coil embolization of inferior mesenteric artery with IR. Tolerated procedure well. Hgb 8.5 at discharge. She was discharged to [facility] rehab due to concerns for weakness.   She has done well in rehab. No episodes of GI bleeding. She has  been receiving PT daily. No recent falls. She is able to ambulate short distances without difficulty, her baseline. Eating 3 meals daily. She has poor vision due to MD. Requesting to go back to AL since she is familiar with room. Husband present during encounter. He reports f/u with Dr. Dianna in 2 weeks.   HTN- controlled without medication CHF- non pitting edema, BNP 46 09/2023, wears compression stockings COPD- no recent exacerbations, remains on Albuterol  and anoro ellipta  OAB- controlled with oxybutynin  Blood loss anemia- see above, remains on ferrous sulfate Depression- no mood changes, very supportive family, remains on Zoloft  RLS- remains on Requip   Husband and daughter to assist with return to [facility] AL 07/10 in afternoon.    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 ENDOSCOPY;  Service: Gastroenterology;  Laterality: N/A;   BASAL CELL CARCINOMA EXCISION  2001   off my nose, left side   BIOPSY  05/08/2021   Procedure: BIOPSY;  Surgeon: Saintclair Jasper, MD;  Location: [facility] ENDOSCOPY;  Service: Gastroenterology;;   BIOPSY  04/25/2023   Procedure: BIOPSY;  Surgeon: Dianna Specking, MD;  Location: [facility] ENDOSCOPY;  Service: Gastroenterology;;    BLEPHAROPLASTY Bilateral    BONE BIOPSY  10/26/2023   Procedure: BIOPSY, GI;  Surgeon: Elicia Claw, MD;  Location: [facility] ENDOSCOPY;  Service: Gastroenterology;;   CATARACT EXTRACTION W/ INTRAOCULAR LENS  IMPLANT, BILATERAL Bilateral 1990's   COLONOSCOPY N/A 10/26/2023   Procedure: COLONOSCOPY;  Surgeon: Elicia Claw, MD;  Location: [facility] ENDOSCOPY;  Service: Gastroenterology;  Laterality: N/A;   COLONOSCOPY WITH PROPOFOL  N/A 05/04/2022   Procedure: COLONOSCOPY WITH PROPOFOL ;  Surgeon: Saintclair Jasper, MD;  Location: [facility] ENDOSCOPY;  Service: Gastroenterology;  Laterality: N/A;   COLONOSCOPY WITH PROPOFOL  N/A 04/25/2023   Procedure: COLONOSCOPY WITH PROPOFOL ;  Surgeon: Dianna Specking, MD;  Location: [facility] ENDOSCOPY;  Service: Gastroenterology;  Laterality: N/A;   ENTEROSCOPY N/A 11/20/2023   Procedure: ENTEROSCOPY;  Surgeon: Dianna Specking, MD;  Location: [facility] ENDOSCOPY;  Service: Gastroenterology;  Laterality: N/A;   ESOPHAGOGASTRODUODENOSCOPY N/A 10/26/2023   Procedure: EGD (ESOPHAGOGASTRODUODENOSCOPY);  Surgeon: Elicia Claw, MD;  Location: [facility] ENDOSCOPY;  Service: Gastroenterology;  Laterality: N/A;   ESOPHAGOGASTRODUODENOSCOPY N/A 11/20/2023   Procedure: EGD (ESOPHAGOGASTRODUODENOSCOPY);  Surgeon: Dianna Specking, MD;  Location: [facility] ENDOSCOPY;  Service: Gastroenterology;  Laterality: N/A;   ESOPHAGOGASTRODUODENOSCOPY (EGD) WITH PROPOFOL  N/A 05/08/2021   Procedure: ESOPHAGOGASTRODUODENOSCOPY (EGD) WITH PROPOFOL ;  Surgeon: Saintclair Jasper, MD;  Location: [facility] ENDOSCOPY;  Service: Gastroenterology;  Laterality: N/A;   ESOPHAGOGASTRODUODENOSCOPY (EGD) WITH PROPOFOL  N/A 01/11/2022   Procedure: ESOPHAGOGASTRODUODENOSCOPY (EGD) WITH PROPOFOL ;  Surgeon: Rosalie Kitchens, MD;  Location: [facility] ENDOSCOPY;  Service: Gastroenterology;  Laterality: N/A;   ESOPHAGOGASTRODUODENOSCOPY (EGD) WITH PROPOFOL  N/A 01/06/2023   Procedure: ESOPHAGOGASTRODUODENOSCOPY (EGD) WITH PROPOFOL ;  Surgeon: Rosalie Kitchens, MD;  Location: [facility]  ENDOSCOPY;  Service: Gastroenterology;  Laterality: N/A;   ESOPHAGOGASTRODUODENOSCOPY (EGD) WITH PROPOFOL  N/A 04/21/2023   Procedure: ESOPHAGOGASTRODUODENOSCOPY (EGD) WITH PROPOFOL ;  Surgeon: Dianna Specking, MD;  Location: [facility] ENDOSCOPY;  Service: Gastroenterology;  Laterality: N/A;   EYE SURGERY Bilateral    to improve vision after cataract OR   GIVENS CAPSULE STUDY N/A 04/21/2023   Procedure: GIVENS CAPSULE STUDY;  Surgeon: Dianna Specking, MD;  Location: [facility] ENDOSCOPY;  Service: Gastroenterology;  Laterality: N/A;   GIVENS CAPSULE STUDY N/A 11/18/2023   Procedure: IMAGING PROCEDURE, GI TRACT, INTRALUMINAL, VIA CAPSULE;  Surgeon: Dianna Specking, MD;  Location: [facility] ENDOSCOPY;  Service: Gastroenterology;  Laterality: N/A;   HEMOSTASIS CLIP PLACEMENT  10/26/2023   Procedure: CONTROL OF HEMORRHAGE, GI TRACT, ENDOSCOPIC, BY CLIPPING OR OVERSEWING;  Surgeon: Elicia Claw, MD;  Location: [facility] ENDOSCOPY;  Service: Gastroenterology;;   IR ANGIOGRAM FOLLOW UP STUDY  12/19/2020   IR ANGIOGRAM SELECTIVE EACH ADDITIONAL VESSEL  12/19/2020   IR ANGIOGRAM SELECTIVE EACH ADDITIONAL VESSEL  12/19/2020   IR ANGIOGRAM SELECTIVE EACH ADDITIONAL VESSEL  12/28/2023   IR ANGIOGRAM VISCERAL SELECTIVE  12/19/2020   IR ANGIOGRAM VISCERAL SELECTIVE  12/28/2023   IR EMBO ART  VEN HEMORR LYMPH EXTRAV  INC GUIDE ROADMAPPING  12/19/2020   IR EMBO ART  VEN HEMORR LYMPH EXTRAV  INC GUIDE ROADMAPPING  12/28/2023   IR US  GUIDE VASC ACCESS RIGHT  12/19/2020   IR US  GUIDE VASC ACCESS  RIGHT  12/28/2023   JOINT REPLACEMENT     POLYPECTOMY  05/04/2022   Procedure: POLYPECTOMY;  Surgeon: Saintclair Jasper, MD;  Location: [facility] ENDOSCOPY;  Service: Gastroenterology;;   POLYPECTOMY  10/26/2023   Procedure: POLYPECTOMY, INTESTINE;  Surgeon: Elicia Claw, MD;  Location: [facility] ENDOSCOPY;  Service: Gastroenterology;;   STAPEDES SURGERY Left    scraped stapedes because it was sticking when it wasn't suppose to   TONSILLECTOMY AND  ADENOIDECTOMY  1946   TOTAL KNEE ARTHROPLASTY Left ~ 2008   TOTAL KNEE ARTHROPLASTY  12/20/2011   Procedure: TOTAL KNEE ARTHROPLASTY;  Surgeon: Elspeth JONELLE Her, MD;  Location: [facility] OR;  Service: Orthopedics;  Laterality: Right;  Right Total Knee Arthroplasty   TUBAL LIGATION  1980's    Allergies  Allergen Reactions   Morphine And Codeine Itching   Aspirin Other (See Comments)    Unknown reaction   Duloxetine  Hcl Other (See Comments)    Unknown reaction   Tymlos [Abaloparatide] Other (See Comments)    Unknown reaction   Ventolin  [Albuterol ] Other (See Comments)    rapid heart beat   Cefadroxil Hives    Patient can take amoxicillin  and cipro     Outpatient Encounter Medications as of 01/07/2024  Medication Sig   acetaminophen  (TYLENOL ) 650 MG CR tablet Take 1,300 mg by mouth in the morning and at bedtime.   albuterol  (VENTOLIN  HFA) 108 (90 Base) MCG/ACT inhaler Inhale 2 puffs into the lungs every 4 (four) hours as needed for wheezing or shortness of breath.   alendronate (FOSAMAX) 70 MG tablet Take 70 mg by mouth every Sunday. Take with a full glass of water on an empty stomach.   Ascorbic Acid  (VITAMIN C ) 1000 MG tablet Take 1,000 mg by mouth every morning.   cetirizine  (ZYRTEC ) 5 MG chewable tablet Chew 5 mg by mouth daily as needed for allergies.   Cholecalciferol  (VITAMIN D3) 50 MCG (2000 UT) TABS Take 2,000 Units by mouth every morning.   cyanocobalamin  (VITAMIN B12) 1000 MCG tablet Take 1,000 mcg by mouth daily.   Dorzolamide  HCl-Timolol  Mal PF 2-0.5 % SOLN Place 1 drop into both eyes in the morning and at bedtime.   ferrous gluconate  (FERGON) 240 (27 FE) MG tablet Take 240 mg by mouth every morning. Take without food   latanoprost  (XALATAN ) 0.005 % ophthalmic solution Place 1 drop into both eyes at bedtime.   Multiple Vitamins-Minerals (PRESERVISION AREDS 2) CAPS Take 1 capsule by mouth 2 (two) times daily.   Netarsudil  Dimesylate (RHOPRESSA ) 0.02 % SOLN Place 1 drop into both eyes  at bedtime.   oxybutynin  (DITROPAN -XL) 10 MG 24 hr tablet Take 10 mg by mouth at bedtime.   pantoprazole  (PROTONIX ) 40 MG tablet Take 1 tablet (40 mg total) by mouth 2 (two) times daily before a meal.   potassium chloride  20 MEQ/15ML (10%) SOLN Take 15 mLs by mouth daily.   Probiotic Product (ALIGN) 4 MG CAPS Take 4 mg by mouth at bedtime.   rOPINIRole  (REQUIP ) 0.5 MG tablet Take 1 tablet (0.5 mg total) by mouth daily as needed.   senna (SENOKOT) 8.6 MG TABS tablet Take 2 tablets by mouth at bedtime.   sertraline  (ZOLOFT ) 50 MG tablet Take 1.5 tablets (75 mg total) by mouth daily.   simvastatin  (ZOCOR ) 20 MG tablet Take 20 mg by mouth every evening.   Sodium Fluoride  (PREVIDENT 5000 BOOSTER PLUS) 1.1 % PSTE Place 1 Application onto teeth See admin instructions. Brush on teeth with a toothbrush after evening mouth care.  Spit out excess and do not rinse.   torsemide  (DEMADEX ) 20 MG tablet Take 20 mg by mouth daily.   traMADol  HCl 25 MG TABS Take 25 mg by mouth 2 (two) times daily.   umeclidinium-vilanterol (ANORO ELLIPTA ) 62.5-25 MCG/ACT AEPB Inhale 1 puff into the lungs every morning.   No facility-administered encounter medications on file as of 01/07/2024.    Review of Systems  Constitutional:  Positive for fatigue. Negative for fever.  HENT:  Negative for sore throat and trouble swallowing.   Eyes:  Positive for visual disturbance.  Respiratory:  Negative for cough, shortness of breath and wheezing.   Cardiovascular:  Positive for leg swelling. Negative for chest pain.  Gastrointestinal:  Negative for abdominal distention, abdominal pain, blood in stool, constipation and diarrhea.  Genitourinary:  Positive for frequency. Negative for dysuria and hematuria.  Musculoskeletal:  Positive for arthralgias and gait problem.  Skin:  Negative for wound.  Neurological:  Positive for weakness. Negative for dizziness and headaches.  Psychiatric/Behavioral:  Positive for dysphoric mood. Negative for  confusion and sleep disturbance. The patient is not nervous/anxious.     Immunization History  Administered Date(s) Administered   Dtap, Unspecified 03/12/2018   Fluad Quad(high Dose 65+) 04/24/2022   Fluad Trivalent(High Dose 65+) 04/27/2023   Fluzone Influenza virus vaccine,trivalent (IIV3), split virus 09/22/2009, 03/29/2010, 07/23/2011, 04/03/2012, 03/30/2014   Influenza Inj Mdck Quad Pf 04/12/2017   Influenza, High Dose Seasonal PF 04/15/2013, 03/14/2018   Influenza, Mdck, Trivalent,PF 6+ MOS(egg free) 03/16/2016, 04/12/2017   Influenza, Quadrivalent, Recombinant, Inj, Pf 03/05/2019, 05/22/2021   Influenza,inj,Quad PF,6+ Mos 03/30/2014, 03/25/2015   PFIZER(Purple Top)SARS-COV-2 Vaccination 07/25/2019, 08/16/2019, 03/31/2020, 03/31/2021, 12/12/2021   Pneumococcal Conjugate-13 10/19/2013, 11/01/2014   Pneumococcal Polysaccharide-23 09/22/2009, 05/01/2010, 07/23/2011   Pneumococcal-Unspecified 03/02/2011   Td (Adult),5 Lf Tetanus Toxid, Preservative Free 07/23/2011   Tdap 09/22/2009, 07/23/2011, 03/12/2018   Zoster Recombinant(Shingrix) 10/08/2017, 12/09/2017   Zoster, Live 09/22/2009, 07/23/2011, 12/16/2017   Pertinent  Health Maintenance Due  Topic Date Due   DEXA SCAN  Never done   INFLUENZA VACCINE  01/30/2024      10/30/2022   12:44 PM 01/22/2023    1:15 PM 04/04/2023   11:00 AM 07/10/2023    9:02 AM 10/24/2023    2:27 PM  Fall Risk  Falls in the past year? 0 0 0 0 0  Was there an injury with Fall? 0 0 0 0 0  Fall Risk Category Calculator 0 0 0 0 0  Patient at Risk for Falls Due to History of fall(s);Impaired balance/gait;Impaired mobility History of fall(s);Impaired balance/gait History of fall(s);Impaired balance/gait;Impaired mobility History of fall(s);Impaired balance/gait;Impaired mobility History of fall(s);Impaired balance/gait;Impaired vision  Fall risk Follow up Falls evaluation completed;Education provided;Falls prevention discussed Falls evaluation  completed;Education provided;Falls prevention discussed Falls evaluation completed;Education provided;Falls prevention discussed Falls evaluation completed;Education provided;Falls prevention discussed Falls evaluation completed;Education provided   Functional Status Survey:    Vitals:   01/07/24 1547  BP: 139/74  Pulse: 88  Resp: 20  Temp: (!) 97.3 F (36.3 C)  SpO2: 97%  Weight: 157 lb 1.6 oz (71.3 kg)  Height: 5' 1 (1.549 m)   Body mass index is 29.68 kg/m. Physical Exam Vitals reviewed.  Constitutional:      General: She is not in acute distress. HENT:     Head: Normocephalic.  Eyes:     General:        Right eye: No discharge.        Left eye: No discharge.  Cardiovascular:     Rate and Rhythm: Normal rate and regular rhythm.     Pulses: Normal pulses.     Heart sounds: Normal heart sounds.  Pulmonary:     Effort: Pulmonary effort is normal.     Breath sounds: Normal breath sounds.  Abdominal:     General: Bowel sounds are normal. There is no distension.     Palpations: Abdomen is soft.     Tenderness: There is no abdominal tenderness.  Musculoskeletal:     Cervical back: Neck supple.     Right lower leg: No edema.     Left lower leg: No edema.  Skin:    General: Skin is warm.     Capillary Refill: Capillary refill takes less than 2 seconds.     Comments: Mild bruising to right groin fold  Neurological:     General: No focal deficit present.     Mental Status: She is alert and oriented to person, place, and time.     Gait: Gait abnormal.  Psychiatric:        Mood and Affect: Mood normal.     Labs reviewed: Recent Labs    11/18/23 1025 11/19/23 0302 11/19/23 0904 11/20/23 0254 11/21/23 0310 12/28/23 0411 12/29/23 0247 12/30/23 0324 12/31/23 0533  NA 138   < >  --  141   < > 140 139 139 140  K 3.0*   < >  --  3.8   < > 3.4* 3.6 3.4* 3.7  CL 105   < >  --  112*   < > 107 109 109 111  CO2 25   < >  --  23   < > 25 23 23 24   GLUCOSE 148*   < >   --  130*   < > 119* 99 122* 118*  BUN 25*   < >  --  13   < > 10 9 7* <5*  CREATININE 0.57   < >  --  <0.30*   < > 0.45 0.36* 0.40* 0.34*  CALCIUM  7.4*   < >  --  7.9*   < > 7.7* 8.2* 8.1* 8.4*  MG 1.9  --   --  2.0   < > 1.9 2.5* 2.3  --   PHOS 2.6  --  2.2* 1.8*  --   --   --   --   --    < > = values in this interval not displayed.   Recent Labs    11/19/23 0302 12/27/23 1130 12/28/23 0411  AST 14* 23 20  ALT 8 8 9   ALKPHOS 29* 65 49  BILITOT 0.8 0.8 1.9*  PROT 4.5* 6.1* 4.9*  ALBUMIN 2.6* 3.5 2.9*   Recent Labs    12/18/23 0000 12/27/23 1130 12/27/23 1139 12/28/23 0411 12/28/23 0843 12/29/23 1723 12/30/23 0324 12/31/23 0533  WBC 4.3 5.8  --  8.5   < > 6.7 4.8 4.5  NEUTROABS 2,137.00 4.0  --  7.1  --   --   --   --   HGB 11.0* 9.4*   < > 8.2*   < > 7.6* 7.1* 8.5*  HCT 35* 31.2*   < > 26.7*   < > 24.8* 23.8* 27.9*  MCV  --  99.7  --  97.4   < > 98.8 101.3* 98.2  PLT 184 170  --  121*   < > 145* 135* 160   < > = values in this  interval not displayed.   Lab Results  Component Value Date   TSH 2.47 10/08/2022   No results found for: HGBA1C Lab Results  Component Value Date   CHOL 125 07/14/2023   HDL 51 07/14/2023   LDLCALC 53 07/14/2023   TRIG 130 07/14/2023    Significant Diagnostic Results in last 30 days:  IR EMBO ART  VEN HEMORR LYMPH EXTRAV  INC GUIDE ROADMAPPING Result Date: 12/28/2023 INDICATION: Diverticular bleed.  Bright red blood per rectum. EXAM: Title; MESENTERIC ARTERIOGRAPHY and COIL EMBOLIZATION for LOWER GI BLEED Listed procedures; 1. ULTRASOUND GUIDANCE for RIGHT COMMON FEMORAL ARTERIAL ACCESS 2. INFERIOR MESENTERIC ARTERY ARTERIOGRAPHY 3. COIL EMBOLIZATION of a DISTAL LEFT COLIC BRANCH VESSEL at iDESCENDING COLON COMPARISON:  CTA AP, 12/27/2023 and 04/19/2023 MEDICATIONS: None ANESTHESIA/SEDATION: Moderate (conscious) sedation was employed during this procedure. A total of Versed  4 mg and Fentanyl  100 mcg was administered intravenously.  Moderate Sedation Time: 60 minutes. The patient's level of consciousness and vital signs were monitored continuously by radiology nursing throughout the procedure under my direct supervision. CONTRAST:  45 mL Omnipaque  300 FLUOROSCOPY: Radiation Exposure Index and estimated peak skin dose (PSD); Reference air kerma (RAK), 211 mGy. COMPLICATIONS: None immediate. PROCEDURE: Informed consent was obtained from the patient and/or patient's representative following explanation of the procedure, risks, benefits and alternatives. All questions were addressed. A time out was performed prior to the initiation of the procedure. Maximal barrier sterile technique utilized including caps, mask, sterile gowns, sterile gloves, large sterile drape, hand hygiene, and Betadine prep. The RIGHT femoral head was marked fluoroscopically. Under sterile conditions and local anesthesia, the RIGHT common femoral artery access was performed with a micropuncture needle. Under direct ultrasound guidance, the RIGHT common femoral was accessed with a micropuncture kit. An ultrasound image was saved for documentation purposes. This allowed for placement of a 5 Fr vascular sheath. A limited arteriogram was performed through the side arm of the sheath confirming appropriate access within the RIGHT common femoral artery. Over a Bentson wire, a Mickelson catheter was advanced, reformed, back bled and flushed. The catheter was then utilized to select the inferior mesenteric artery and a selective inferior mesenteric arteriogram was performed. Using a 2.1 Fr Progreat alpha microcatheter and microwire, access into the distal LEFT colic artery branch vessels was performed and selective arteriography was obtained to identify the bleeding vessel. Super selective coil embolization was then performed using a single non detachable micro coil. A post embolization arteriogram was performed without evidence of residual extravasation. Images were reviewed and the  procedure was terminated. All wires, catheters and sheaths were removed from the patient. Hemostasis was achieved at the RIGHT groin access site with Angio-Seal closure device. The patient tolerated the procedure well without immediate post procedural complication. FINDINGS: - access via the RIGHT femoral artery. - No overt extravasation at the descending colon with suspected ooze from IMA territory, distal LEFT colic branch vessel - Successful embolization of the target vessel with a single micro coil - Angio-Seal closure at RIGHT groin. Palpable RLE pulses at the end of the Case IMPRESSION: Successful super-selective coil embolization of a target vessel at site of ooze from IMA territory, LEFT colic branch vessel, as above. PLAN: - The patient is to remain flat for 2 hours with RIGHT leg straight. - The patient may continue to experience several additional bloody bowel movements however should clear the rectal vault in the coming days. Thom Hall, MD [facility] Radiology Electronically Signed   By:  Thom Hall M.D.   On: 12/28/2023 11:15   IR Angiogram Visceral Selective Result Date: 12/28/2023 INDICATION: Diverticular bleed.  Bright red blood per rectum. EXAM: Title; MESENTERIC ARTERIOGRAPHY and COIL EMBOLIZATION for LOWER GI BLEED Listed procedures; 1. ULTRASOUND GUIDANCE for RIGHT COMMON FEMORAL ARTERIAL ACCESS 2. INFERIOR MESENTERIC ARTERY ARTERIOGRAPHY 3. COIL EMBOLIZATION of a DISTAL LEFT COLIC BRANCH VESSEL at iDESCENDING COLON COMPARISON:  CTA AP, 12/27/2023 and 04/19/2023 MEDICATIONS: None ANESTHESIA/SEDATION: Moderate (conscious) sedation was employed during this procedure. A total of Versed  4 mg and Fentanyl  100 mcg was administered intravenously. Moderate Sedation Time: 60 minutes. The patient's level of consciousness and vital signs were monitored continuously by radiology nursing throughout the procedure under my direct supervision. CONTRAST:  45  mL Omnipaque  300 FLUOROSCOPY: Radiation Exposure Index and estimated peak skin dose (PSD); Reference air kerma (RAK), 211 mGy. COMPLICATIONS: None immediate. PROCEDURE: Informed consent was obtained from the patient and/or patient's representative following explanation of the procedure, risks, benefits and alternatives. All questions were addressed. A time out was performed prior to the initiation of the procedure. Maximal barrier sterile technique utilized including caps, mask, sterile gowns, sterile gloves, large sterile drape, hand hygiene, and Betadine prep. The RIGHT femoral head was marked fluoroscopically. Under sterile conditions and local anesthesia, the RIGHT common femoral artery access was performed with a micropuncture needle. Under direct ultrasound guidance, the RIGHT common femoral was accessed with a micropuncture kit. An ultrasound image was saved for documentation purposes. This allowed for placement of a 5 Fr vascular sheath. A limited arteriogram was performed through the side arm of the sheath confirming appropriate access within the RIGHT common femoral artery. Over a Bentson wire, a Mickelson catheter was advanced, reformed, back bled and flushed. The catheter was then utilized to select the inferior mesenteric artery and a selective inferior mesenteric arteriogram was performed. Using a 2.1 Fr Progreat alpha microcatheter and microwire, access into the distal LEFT colic artery branch vessels was performed and selective arteriography was obtained to identify the bleeding vessel. Super selective coil embolization was then performed using a single non detachable micro coil. A post embolization arteriogram was performed without evidence of residual extravasation. Images were reviewed and the procedure was terminated. All wires, catheters and sheaths were removed from the patient. Hemostasis was achieved at the RIGHT groin access site with Angio-Seal closure device. The patient tolerated the  procedure well without immediate post procedural complication. FINDINGS: - access via the RIGHT femoral artery. - No overt extravasation at the descending colon with suspected ooze from IMA territory, distal LEFT colic branch vessel - Successful embolization of the target vessel with a single micro coil - Angio-Seal closure at RIGHT groin. Palpable RLE pulses at the end of the Case IMPRESSION: Successful super-selective coil embolization of a target vessel at site of ooze from IMA territory, LEFT colic branch vessel, as above. PLAN: - The patient is to remain flat for 2 hours with RIGHT leg straight. - The patient may continue to experience several additional bloody bowel movements however should clear the rectal vault in the coming days. Thom Hall, MD [facility] Radiology Electronically Signed   By: Thom Hall M.D.   On: 12/28/2023 11:15   IR Angiogram Selective Each Additional Vessel Result Date: 12/28/2023 INDICATION: Diverticular bleed.  Bright red blood per rectum. EXAM: Title; MESENTERIC ARTERIOGRAPHY and COIL EMBOLIZATION for LOWER GI BLEED Listed procedures; 1. ULTRASOUND GUIDANCE for RIGHT COMMON FEMORAL ARTERIAL ACCESS 2. INFERIOR MESENTERIC ARTERY ARTERIOGRAPHY 3.  COIL EMBOLIZATION of a DISTAL LEFT COLIC BRANCH VESSEL at iDESCENDING COLON COMPARISON:  CTA AP, 12/27/2023 and 04/19/2023 MEDICATIONS: None ANESTHESIA/SEDATION: Moderate (conscious) sedation was employed during this procedure. A total of Versed  4 mg and Fentanyl  100 mcg was administered intravenously. Moderate Sedation Time: 60 minutes. The patient's level of consciousness and vital signs were monitored continuously by radiology nursing throughout the procedure under my direct supervision. CONTRAST:  45 mL Omnipaque  300 FLUOROSCOPY: Radiation Exposure Index and estimated peak skin dose (PSD); Reference air kerma (RAK), 211 mGy. COMPLICATIONS: None immediate. PROCEDURE: Informed consent  was obtained from the patient and/or patient's representative following explanation of the procedure, risks, benefits and alternatives. All questions were addressed. A time out was performed prior to the initiation of the procedure. Maximal barrier sterile technique utilized including caps, mask, sterile gowns, sterile gloves, large sterile drape, hand hygiene, and Betadine prep. The RIGHT femoral head was marked fluoroscopically. Under sterile conditions and local anesthesia, the RIGHT common femoral artery access was performed with a micropuncture needle. Under direct ultrasound guidance, the RIGHT common femoral was accessed with a micropuncture kit. An ultrasound image was saved for documentation purposes. This allowed for placement of a 5 Fr vascular sheath. A limited arteriogram was performed through the side arm of the sheath confirming appropriate access within the RIGHT common femoral artery. Over a Bentson wire, a Mickelson catheter was advanced, reformed, back bled and flushed. The catheter was then utilized to select the inferior mesenteric artery and a selective inferior mesenteric arteriogram was performed. Using a 2.1 Fr Progreat alpha microcatheter and microwire, access into the distal LEFT colic artery branch vessels was performed and selective arteriography was obtained to identify the bleeding vessel. Super selective coil embolization was then performed using a single non detachable micro coil. A post embolization arteriogram was performed without evidence of residual extravasation. Images were reviewed and the procedure was terminated. All wires, catheters and sheaths were removed from the patient. Hemostasis was achieved at the RIGHT groin access site with Angio-Seal closure device. The patient tolerated the procedure well without immediate post procedural complication. FINDINGS: - access via the RIGHT femoral artery. - No overt extravasation at the descending colon with suspected ooze from IMA  territory, distal LEFT colic branch vessel - Successful embolization of the target vessel with a single micro coil - Angio-Seal closure at RIGHT groin. Palpable RLE pulses at the end of the Case IMPRESSION: Successful super-selective coil embolization of a target vessel at site of ooze from IMA territory, LEFT colic branch vessel, as above. PLAN: - The patient is to remain flat for 2 hours with RIGHT leg straight. - The patient may continue to experience several additional bloody bowel movements however should clear the rectal vault in the coming days. Thom Hall, MD [facility] Radiology Electronically Signed   By: Thom Hall M.D.   On: 12/28/2023 11:15   IR US  Guide Vasc Access Right Result Date: 12/28/2023 INDICATION: Diverticular bleed.  Bright red blood per rectum. EXAM: Title; MESENTERIC ARTERIOGRAPHY and COIL EMBOLIZATION for LOWER GI BLEED Listed procedures; 1. ULTRASOUND GUIDANCE for RIGHT COMMON FEMORAL ARTERIAL ACCESS 2. INFERIOR MESENTERIC ARTERY ARTERIOGRAPHY 3. COIL EMBOLIZATION of a DISTAL LEFT COLIC BRANCH VESSEL at iDESCENDING COLON COMPARISON:  CTA AP, 12/27/2023 and 04/19/2023 MEDICATIONS: None ANESTHESIA/SEDATION: Moderate (conscious) sedation was employed during this procedure. A total of Versed  4 mg and Fentanyl  100 mcg was administered intravenously. Moderate Sedation Time: 60 minutes. The patient's level of consciousness and vital  signs were monitored continuously by radiology nursing throughout the procedure under my direct supervision. CONTRAST:  45 mL Omnipaque  300 FLUOROSCOPY: Radiation Exposure Index and estimated peak skin dose (PSD); Reference air kerma (RAK), 211 mGy. COMPLICATIONS: None immediate. PROCEDURE: Informed consent was obtained from the patient and/or patient's representative following explanation of the procedure, risks, benefits and alternatives. All questions were addressed. A time out was performed prior to the  initiation of the procedure. Maximal barrier sterile technique utilized including caps, mask, sterile gowns, sterile gloves, large sterile drape, hand hygiene, and Betadine prep. The RIGHT femoral head was marked fluoroscopically. Under sterile conditions and local anesthesia, the RIGHT common femoral artery access was performed with a micropuncture needle. Under direct ultrasound guidance, the RIGHT common femoral was accessed with a micropuncture kit. An ultrasound image was saved for documentation purposes. This allowed for placement of a 5 Fr vascular sheath. A limited arteriogram was performed through the side arm of the sheath confirming appropriate access within the RIGHT common femoral artery. Over a Bentson wire, a Mickelson catheter was advanced, reformed, back bled and flushed. The catheter was then utilized to select the inferior mesenteric artery and a selective inferior mesenteric arteriogram was performed. Using a 2.1 Fr Progreat alpha microcatheter and microwire, access into the distal LEFT colic artery branch vessels was performed and selective arteriography was obtained to identify the bleeding vessel. Super selective coil embolization was then performed using a single non detachable micro coil. A post embolization arteriogram was performed without evidence of residual extravasation. Images were reviewed and the procedure was terminated. All wires, catheters and sheaths were removed from the patient. Hemostasis was achieved at the RIGHT groin access site with Angio-Seal closure device. The patient tolerated the procedure well without immediate post procedural complication. FINDINGS: - access via the RIGHT femoral artery. - No overt extravasation at the descending colon with suspected ooze from IMA territory, distal LEFT colic branch vessel - Successful embolization of the target vessel with a single micro coil - Angio-Seal closure at RIGHT groin. Palpable RLE pulses at the end of the Case  IMPRESSION: Successful super-selective coil embolization of a target vessel at site of ooze from IMA territory, LEFT colic branch vessel, as above. PLAN: - The patient is to remain flat for 2 hours with RIGHT leg straight. - The patient may continue to experience several additional bloody bowel movements however should clear the rectal vault in the coming days. Thom Hall, MD [facility] Radiology Electronically Signed   By: Thom Hall M.D.   On: 12/28/2023 11:15   CT ANGIO GI BLEED Result Date: 12/27/2023 CLINICAL DATA:  Rectal bleeding.  Left upper abdominal pain. EXAM: CTA ABDOMEN AND PELVIS WITHOUT AND WITH CONTRAST TECHNIQUE: Multidetector CT imaging of the abdomen and pelvis was performed using the standard protocol during bolus administration of intravenous contrast. Multiplanar reconstructed images and MIPs were obtained and reviewed to evaluate the vascular anatomy. RADIATION DOSE REDUCTION: This exam was performed according to the departmental dose-optimization program which includes automated exposure control, adjustment of the mA and/or kV according to patient size and/or use of iterative reconstruction technique. CONTRAST:  OMNIPAQUE  IOHEXOL  350 MG/ML SOLN COMPARISON:  None Available. FINDINGS: VASCULAR Aorta: Atherosclerotic disease in the abdominal aorta without aneurysm or dissection or significant stenosis. Celiac: Patent without evidence of aneurysm, dissection, vasculitis or significant stenosis. SMA: Patent without evidence of aneurysm, dissection, vasculitis or significant stenosis. Renals: Single bilateral renal arteries. Left renal artery is widely patent  without aneurysm, dissection or stenosis. At least mild stenosis at origin of the right renal artery without aneurysm or dissection. IMA: Patent without evidence of aneurysm, dissection, vasculitis or significant stenosis. Endovascular coils involving a branch of the left colic  artery/marginal artery near the splenic flexure. Inflow: Patent without evidence of aneurysm, dissection, vasculitis or significant stenosis. Proximal Outflow: Proximal femoral arteries are patent bilaterally. Veins: Portal venous system is patent. Normal appearance of the IVC and renal veins. Normal appearance of the iliac veins. Review of the MIP images confirms the above findings. NON-VASCULAR Lower chest: Peripheral reticular densities at the lung bases are suggestive for chronic changes. No pleural effusions. Hepatobiliary: Gallbladder is mildly distended with high-density sludge or small stones. No gallbladder inflammation. Main portal venous system is patent. No biliary dilatation. Pancreas: Unremarkable. No pancreatic ductal dilatation or surrounding inflammatory changes. Spleen: Normal in size without focal abnormality. Adrenals/Urinary Tract: Normal adrenal glands. No kidney stones. Cyst in the left kidney upper pole. No hydronephrosis. No suspicious renal lesion. Normal urinary bladder. Stomach/Bowel: Positive for active GI bleeding involving the descending colon. 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 The area of arterial bleeding in the colon is distal to the previously embolized area. Extensive diverticular disease throughout the colon. Endoscopic clip in the right colon near the ileocecal valve. Normal appendix. Normal stomach. No bowel dilatation or obstruction. Lymphatic: No lymph node enlargement in the abdomen or pelvis. Reproductive: Uterus and bilateral adnexa are unremarkable. Other: Negative for ascites.  Negative for free air. Musculoskeletal: No acute bone abnormality. Old vertebral body compression fracture at L1. IMPRESSION: 1. Positive for active GI bleeding involving the descending colon. Findings are most compatible with a diverticular bleed. 2. Endovascular coils involving left colic artery/marginal artery near the  splenic flexure. 3. Extensive diverticular disease throughout the colon. 4. Gallbladder sludge or small stones. No gallbladder inflammation. 5. Aortic Atherosclerosis (ICD10-I70.0). These results were called by telephone at the time of interpretation on 12/27/2023 at 5:12 pm to provider Dr. Efrain, who verbally acknowledged these results. Electronically Signed   By: Juliene Balder M.D.   On: 12/27/2023 20:33    Assessment/Plan 1. History of GI diverticular bleed (Primary) - hospitalized 06/28-07/03> hgb trended to 6.4> 8-9 episodes hematochezia> 06/29 coil embolization with IR - discharge hgb 8.5 - no new episodes of GI bleeding - f/u scheduled with Dr. Dianna  - cont pantoprazole   2. Essential hypertension - controlled without medication  3. Chronic diastolic heart failure (HCC) - compensated - cont torsemide   4. Mixed simple and mucopurulent chronic bronchitis (HCC) - no recent exacerbations - cont albuterol  and anoro ellipta   5. OAB (overactive bladder) - cont oxybutynin   6. 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 Recurrent depression (HCC) - no mood changes - supportive family - cont Zoloft   8. Restless leg syndrome - cont Requip     Family/ staff Communication: plan discussed with patient and nurse  Labs/tests ordered:  none

## 2024-01-08 ENCOUNTER — Non-Acute Institutional Stay (SKILLED_NURSING_FACILITY): Payer: Self-pay | Admitting: Sports Medicine

## 2024-01-08 ENCOUNTER — Encounter: Payer: Self-pay | Admitting: Sports Medicine

## 2024-01-08 DIAGNOSIS — R32 Unspecified urinary incontinence: Secondary | ICD-10-CM

## 2024-01-08 DIAGNOSIS — G2581 Restless legs syndrome: Secondary | ICD-10-CM

## 2024-01-08 DIAGNOSIS — E785 Hyperlipidemia, unspecified: Secondary | ICD-10-CM | POA: Diagnosis not present

## 2024-01-08 DIAGNOSIS — I509 Heart failure, unspecified: Secondary | ICD-10-CM

## 2024-01-08 DIAGNOSIS — J449 Chronic obstructive pulmonary disease, unspecified: Secondary | ICD-10-CM | POA: Diagnosis not present

## 2024-01-08 DIAGNOSIS — K922 Gastrointestinal hemorrhage, unspecified: Secondary | ICD-10-CM | POA: Diagnosis not present

## 2024-01-08 NOTE — Progress Notes (Signed)
 Provider:  Oren Blazing  Location:   [facility] Room Number: 66-B Place of Service:  SNF (31)  PCP: Gil Greig BRAVO, NP Patient Care Team: Gil Greig BRAVO, NP as PCP - General (Adult Health Nurse Practitioner) Charlanne Fredia CROME, MD as Consulting Physician (Internal Medicine) Apickup-Ot, A, OT as Occupational Therapist (Occupational Therapy)  Extended Emergency Contact Information Primary Emergency Contact: Hennon,[name] Address: [address]          [place], [place] [address] [place] Home Phone: [phone] Mobile Phone: [phone] Relation: Spouse Secondary Emergency Contact: Lippens MD,[name] Ozell Crane Phone: [phone] Relation: Son Interpreter needed? No  Code Status: DNR Goals of Care: Advanced Directive information    01/08/2024    4:16 PM  Advanced Directives  Does Patient Have a Medical Advance Directive? Yes  Type of Advance Directive Out of facility DNR (pink MOST or yellow form)  Does patient want to make changes to medical advance directive? No - Patient declined      Chief Complaint  Patient presents with   Medical Management of Chronic Issues    Routine visit. Discuss the need for Covid Booster, and Dexa scan.    HPI: Patient is a 83 y.o. female with past medical history of hyperlipidemia, COPD, restless leg syndrome, hypertension, GERD, GAD, heart failure with preserved ejection fraction, hypokalemia is  seen today for follow-up.  Patient was recently admitted to skilled care after her recent hospitalization for acute lower GI bleeding.  Patient had a CTA which showed extravasation in the descending colon underwent successful IR embolization and required 2 units of blood transfusion in the hospital. Patient denies abdominal pain, nausea, vomiting, bloody or dark-colored stools since admission to skilled care.  She is ready for discharge and is going to her apartment.  Patient denies chest pain, shortness of  breath, abdominal pain, dysuria, dizzy, lightheadedness.  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, left side   Sleeping excessive    Tinnitus of both ears    Past Surgical History:  Procedure Laterality Date   BALLOON DILATION N/A 05/08/2021   Procedure: BALLOON DILATION;  Surgeon: Saintclair Jasper, MD;  Location: [facility] ENDOSCOPY;  Service: Gastroenterology;  Laterality: N/A;   BASAL CELL CARCINOMA EXCISION  2001   off my nose, left side   BIOPSY  05/08/2021   Procedure: BIOPSY;  Surgeon: Saintclair Jasper, MD;  Location: [facility] ENDOSCOPY;  Service: Gastroenterology;;   BIOPSY  04/25/2023   Procedure: BIOPSY;  Surgeon: Dianna Specking, MD;  Location: [facility] ENDOSCOPY;  Service: Gastroenterology;;   BLEPHAROPLASTY Bilateral    BONE BIOPSY  10/26/2023   Procedure: BIOPSY, GI;  Surgeon: Elicia Claw, MD;  Location: [facility] ENDOSCOPY;  Service: Gastroenterology;;   CATARACT EXTRACTION W/ INTRAOCULAR LENS  IMPLANT, BILATERAL Bilateral 1990's   COLONOSCOPY N/A 10/26/2023   Procedure: COLONOSCOPY;  Surgeon: Elicia,  Layla, MD;  Location: [facility] ENDOSCOPY;  Service: Gastroenterology;  Laterality: N/A;   COLONOSCOPY WITH PROPOFOL  N/A 05/04/2022   Procedure: COLONOSCOPY WITH PROPOFOL ;  Surgeon: Saintclair Jasper, MD;  Location: [facility] ENDOSCOPY;  Service: Gastroenterology;  Laterality: N/A;   COLONOSCOPY WITH PROPOFOL  N/A 04/25/2023   Procedure: COLONOSCOPY WITH PROPOFOL ;  Surgeon: Dianna Specking, MD;  Location: [facility] ENDOSCOPY;  Service: Gastroenterology;  Laterality: N/A;   ENTEROSCOPY N/A 11/20/2023   Procedure: ENTEROSCOPY;  Surgeon: Dianna Specking, MD;  Location: [facility] ENDOSCOPY;  Service: Gastroenterology;  Laterality: N/A;   ESOPHAGOGASTRODUODENOSCOPY N/A 10/26/2023   Procedure: EGD (ESOPHAGOGASTRODUODENOSCOPY);  Surgeon: Elicia Layla, MD;  Location: [facility] ENDOSCOPY;  Service: Gastroenterology;  Laterality: N/A;   ESOPHAGOGASTRODUODENOSCOPY N/A 11/20/2023   Procedure: EGD (ESOPHAGOGASTRODUODENOSCOPY);  Surgeon: Dianna Specking, MD;  Location: [facility] ENDOSCOPY;  Service: Gastroenterology;  Laterality: N/A;   ESOPHAGOGASTRODUODENOSCOPY (EGD) WITH PROPOFOL  N/A 05/08/2021   Procedure: ESOPHAGOGASTRODUODENOSCOPY (EGD) WITH PROPOFOL ;  Surgeon: Saintclair Jasper, MD;  Location[facility] ENDOSCOPY;  Service: Gastroenterology;  Laterality: N/A;   ESOPHAGOGASTRODUODENOSCOPY (EGD) WITH PROPOFOL  N/A 01/11/2022   Procedure: ESOPHAGOGASTRODUODENOSCOPY (EGD) WITH PROPOFOL ;  Surgeon: Rosalie Kitchens, MD;  Location: [facility] ENDOSCOPY;  Service: Gastroenterology;  Laterality: N/A;   ESOPHAGOGASTRODUODENOSCOPY (EGD) WITH PROPOFOL  N/A 01/06/2023   Procedure: ESOPHAGOGASTRODUODENOSCOPY (EGD) WITH PROPOFOL ;  Surgeon: Rosalie Kitchens, MD;  Location: [facility] ENDOSCOPY;  Service: Gastroenterology;  Laterality: N/A;   ESOPHAGOGASTRODUODENOSCOPY (EGD) WITH PROPOFOL  N/A 04/21/2023   Procedure: ESOPHAGOGASTRODUODENOSCOPY (EGD) WITH PROPOFOL ;  Surgeon: Dianna Specking, MD;  Location: [facility] ENDOSCOPY;  Service: Gastroenterology;  Laterality: N/A;   EYE SURGERY Bilateral    to improve vision after cataract OR   GIVENS CAPSULE STUDY N/A 04/21/2023   Procedure: GIVENS CAPSULE STUDY;  Surgeon: Dianna Specking, MD;  Location: [facility] ENDOSCOPY;  Service: Gastroenterology;  Laterality: N/A;   GIVENS CAPSULE STUDY N/A 11/18/2023   Procedure: IMAGING PROCEDURE, GI TRACT, INTRALUMINAL, VIA CAPSULE;  Surgeon: Dianna Specking, MD;  Location: [facility] ENDOSCOPY;   Service: Gastroenterology;  Laterality: N/A;   HEMOSTASIS CLIP PLACEMENT  10/26/2023   Procedure: CONTROL OF HEMORRHAGE, GI TRACT, ENDOSCOPIC, BY CLIPPING OR OVERSEWING;  Surgeon: Elicia Layla, MD;  Location: [facility] ENDOSCOPY;  Service: Gastroenterology;;   IR ANGIOGRAM FOLLOW UP STUDY  12/19/2020   IR ANGIOGRAM SELECTIVE EACH ADDITIONAL VESSEL  12/19/2020   IR ANGIOGRAM SELECTIVE EACH ADDITIONAL VESSEL  12/19/2020   IR ANGIOGRAM SELECTIVE EACH ADDITIONAL VESSEL  12/28/2023   IR ANGIOGRAM VISCERAL SELECTIVE  12/19/2020   IR ANGIOGRAM VISCERAL SELECTIVE  12/28/2023   IR EMBO ART  VEN HEMORR LYMPH EXTRAV  INC GUIDE ROADMAPPING  12/19/2020   IR EMBO ART  VEN HEMORR LYMPH EXTRAV  INC GUIDE ROADMAPPING  12/28/2023   IR US  GUIDE VASC ACCESS RIGHT  12/19/2020   IR US  GUIDE VASC ACCESS RIGHT  12/28/2023   JOINT REPLACEMENT     POLYPECTOMY  05/04/2022   Procedure: POLYPECTOMY;  Surgeon: Saintclair Jasper, MD;  Location: [facility] ENDOSCOPY;  Service: Gastroenterology;;   POLYPECTOMY  10/26/2023   Procedure: POLYPECTOMY, INTESTINE;  Surgeon: Elicia Layla, MD;  Location: [facility] ENDOSCOPY;  Service: Gastroenterology;;   STAPEDES SURGERY Left    scraped stapedes because it was sticking when it wasn't suppose to   TONSILLECTOMY AND ADENOIDECTOMY  1946   TOTAL KNEE ARTHROPLASTY Left ~ 2008   TOTAL KNEE ARTHROPLASTY  12/20/2011   Procedure: TOTAL KNEE ARTHROPLASTY;  Surgeon: Elspeth JONELLE Her, MD;  Location[facility] OR;  Service: Orthopedics;  Laterality: Right;  Right Total Knee Arthroplasty   TUBAL LIGATION  1980's  reports that she quit smoking about 31 years ago. Her smoking use included cigarettes. She started smoking about 68 years ago. She has a 27.8 pack-year smoking history. She has never been exposed to tobacco smoke. She has never used smokeless tobacco. She reports current alcohol  use of about 7.0 standard drinks of alcohol  per week. She reports that she does not use drugs. Social History   Socioeconomic History    Marital status: Married    Spouse name: Signe    Number of children: 3   Years of education: College   Highest education level: Not on file  Occupational History   Occupation: Retired Runner, broadcasting/film/video  Tobacco Use   Smoking status: Former    Current packs/day: 0.00    Average packs/day: 0.8 packs/day for 37.0 years (27.8 ttl pk-yrs)    Types: Cigarettes    Start date: 07/02/1955    Quit date: 07/01/1992    Years since quitting: 31.5    Passive exposure: Never   Smokeless tobacco: Never  Vaping Use   Vaping status: Never Used  Substance and Sexual Activity   Alcohol  use: Yes    Alcohol /week: 7.0 standard drinks of alcohol     Types: 7 Glasses of wine per week    Comment: 09/05/2017  6 oz wine, 5 days/wk   Drug use: No    Types: Hydrocodone    Sexual activity: Not Currently  Other Topics Concern   Not on file  Social History Narrative   1-2 cups of coffee a day    Social Drivers of Health   Financial Resource Strain: Low Risk  (04/04/2023)   Overall Financial Resource Strain (CARDIA)    Difficulty of Paying Living Expenses: Not hard at all  Food Insecurity: No Food Insecurity (12/28/2023)   Hunger Vital Sign    Worried About Running Out of Food in the Last Year: Never true    Ran Out of Food in the Last Year: Never true  Transportation Needs: No Transportation Needs (12/28/2023)   PRAPARE - Administrator, Civil Service (Medical): No    Lack of Transportation (Non-Medical): No  Physical Activity: Insufficiently Active (04/04/2023)   Exercise Vital Sign    Days of Exercise per Week: 2 days    Minutes of Exercise per Session: 20 min  Stress: No Stress Concern Present (04/04/2023)   [employer] of Occupational Health - Occupational Stress Questionnaire    Feeling of Stress : Only a little  Social Connections: Socially Integrated (12/28/2023)   Social Connection and Isolation Panel    Frequency of Communication with Friends and Family: More than three times a week     Frequency of Social Gatherings with Friends and Family: More than three times a week    Attends Religious Services: 1 to 4 times per year    Active Member of [employer] or Organizations: Yes    Attends Banker Meetings: 1 to 4 times per year    Marital Status: Married  Catering manager Violence: Unknown (12/28/2023)   Humiliation, Afraid, Rape, and Kick questionnaire    Fear of Current or Ex-Partner: No    Emotionally Abused: No    Physically Abused: No    Sexually Abused: Not on file    Functional Status Survey:    Family History  Problem Relation Age of Onset   Heart attack Mother    Heart disease Father    Heart failure Father    Bladder Cancer Father    Supraventricular tachycardia Sister  Hypertension Sister     Health Maintenance  Topic Date Due   DEXA SCAN  Never done   COVID-19 Vaccine (6 - 2024-25 season) 03/02/2023   INFLUENZA VACCINE  01/30/2024   Medicare Annual Wellness (AWV)  04/03/2024   DTaP/Tdap/Td (6 - Td or Tdap) 03/12/2028   Pneumococcal Vaccine: 50+ Years  Completed   Zoster Vaccines- Shingrix  Completed   Hepatitis B Vaccines  Aged Out   HPV VACCINES  Aged Out   Meningococcal B Vaccine  Aged Out    Allergies  Allergen Reactions   Morphine And Codeine Itching   Aspirin Other (See Comments)    Unknown reaction   Duloxetine  Hcl Other (See Comments)    Unknown reaction   Tymlos [Abaloparatide] Other (See Comments)    Unknown reaction   Ventolin  [Albuterol ] Other (See Comments)    rapid heart beat   Cefadroxil Hives    Patient can take amoxicillin  and cipro     Allergies as of 01/08/2024       Reactions   Morphine And Codeine Itching   Aspirin Other (See Comments)   Unknown reaction   Duloxetine  Hcl Other (See Comments)   Unknown reaction   Tymlos [abaloparatide] Other (See Comments)   Unknown reaction   Ventolin  [albuterol ] Other (See Comments)   rapid heart beat   Cefadroxil Hives   Patient can take amoxicillin  and cipro          Medication List        Accurate as of January 08, 2024  4:16 PM. If you have any questions, ask your nurse or doctor.          acetaminophen  650 MG CR tablet Commonly known as: TYLENOL  Take 1,300 mg by mouth in the morning and at bedtime.   TYLENOL  PO Take 1,000 mg by mouth 2 (two) times daily.   albuterol  108 (90 Base) MCG/ACT inhaler Commonly known as: VENTOLIN  HFA Inhale 2 puffs into the lungs every 4 (four) hours as needed for wheezing or shortness of breath.   alendronate 70 MG tablet Commonly known as: FOSAMAX Take 70 mg by mouth every Sunday. Take with a full glass of water on an empty stomach.   Align 4 MG Caps Take 4 mg by mouth at bedtime.   Anoro Ellipta  62.5-25 MCG/ACT Aepb Generic drug: umeclidinium-vilanterol Inhale 1 puff into the lungs every morning.   cetirizine  5 MG chewable tablet Commonly known as: ZYRTEC  Chew 5 mg by mouth daily as needed for allergies.   cyanocobalamin  1000 MCG tablet Commonly known as: VITAMIN B12 Take 1,000 mcg by mouth daily.   Dorzolamide  HCl-Timolol  Mal PF 2-0.5 % Soln Place 1 drop into both eyes in the morning and at bedtime.   ferrous gluconate  240 (27 FE) MG tablet Commonly known as: FERGON Take 240 mg by mouth every morning. Take without food   latanoprost  0.005 % ophthalmic solution Commonly known as: XALATAN  Place 1 drop into both eyes at bedtime.   oxybutynin  10 MG 24 hr tablet Commonly known as: DITROPAN -XL Take 10 mg by mouth at bedtime.   pantoprazole  40 MG tablet Commonly known as: PROTONIX  Take 1 tablet (40 mg total) by mouth 2 (two) times daily before a meal.   potassium chloride  20 MEQ/15ML (10%) Soln Take 15 mLs by mouth every other day.   PreserVision AREDS 2 Caps Take 1 capsule by mouth 2 (two) times daily.   PreviDent 5000 Booster Plus 1.1 % Pste Generic drug: Sodium Fluoride  Place 1 Application onto  teeth See admin instructions. Brush on teeth with a toothbrush after evening  mouth care. Spit out excess and do not rinse.   Rhopressa  0.02 % Soln Generic drug: Netarsudil  Dimesylate Place 1 drop into both eyes at bedtime.   rOPINIRole  0.5 MG tablet Commonly known as: REQUIP  Take 1 tablet (0.5 mg total) by mouth daily as needed.   senna 8.6 MG Tabs tablet Commonly known as: SENOKOT Take 2 tablets by mouth at bedtime.   sertraline  50 MG tablet Commonly known as: ZOLOFT  Take 1.5 tablets (75 mg total) by mouth daily.   simvastatin  20 MG tablet Commonly known as: ZOCOR  Take 20 mg by mouth every evening.   torsemide  20 MG tablet Commonly known as: DEMADEX  Take 20 mg by mouth every other day.   traMADol  HCl 25 MG Tabs Take 25 mg by mouth 2 (two) times daily.   vitamin C  1000 MG tablet Take 1,000 mg by mouth every morning.   Vitamin D3 50 MCG (2000 UT) Tabs Take 2,000 Units by mouth every morning.        Review of Systems  Constitutional:  Negative for fever.  Respiratory:  Negative for cough and shortness of breath.   Cardiovascular:  Negative for chest pain and leg swelling.  Gastrointestinal:  Negative for abdominal distention, abdominal pain, blood in stool, constipation, diarrhea and nausea.  Genitourinary:  Negative for dysuria.  Neurological:  Negative for dizziness.    Vitals:   01/08/24 1244  BP: 118/62  Pulse: 90  Resp: 20  Temp: (!) 97.5 F (36.4 C)  SpO2: 97%  Weight: 157 lb 1.6 oz (71.3 kg)  Height: 5' 1 (1.549 m)   Body mass index is 29.68 kg/m. Physical Exam Constitutional:      Appearance: Normal appearance.  Cardiovascular:     Rate and Rhythm: Normal rate and regular rhythm.  Pulmonary:     Effort: Pulmonary effort is normal. No respiratory distress.     Breath sounds: Normal breath sounds. No wheezing.  Abdominal:     General: Bowel sounds are normal. There is no distension.     Tenderness: There is no abdominal tenderness. There is no guarding.     Comments:    Musculoskeletal:        General: No  swelling.  Neurological:     Mental Status: She is alert. Mental status is at baseline.     Motor: No weakness.     Labs reviewed: Basic Metabolic Panel: Recent Labs    11/18/23 1025 11/19/23 0302 11/19/23 9095 11/20/23 0254 11/21/23 0310 12/28/23 0411 12/29/23 0247 12/30/23 0324 12/31/23 0533  NA 138   < >  --  141   < > 140 139 139 140  K 3.0*   < >  --  3.8   < > 3.4* 3.6 3.4* 3.7  CL 105   < >  --  112*   < > 107 109 109 111  CO2 25   < >  --  23   < > 25 23 23 24   GLUCOSE 148*   < >  --  130*   < > 119* 99 122* 118*  BUN 25*   < >  --  13   < > 10 9 7* <5*  CREATININE 0.57   < >  --  <0.30*   < > 0.45 0.36* 0.40* 0.34*  CALCIUM  7.4*   < >  --  7.9*   < > 7.7* 8.2* 8.1* 8.4*  MG 1.9  --   --  2.0   < > 1.9 2.5* 2.3  --   PHOS 2.6  --  2.2* 1.8*  --   --   --   --   --    < > = values in this interval not displayed.   Liver Function Tests: Recent Labs    11/19/23 0302 12/27/23 1130 12/28/23 0411  AST 14* 23 20  ALT 8 8 9   ALKPHOS 29* 65 49  BILITOT 0.8 0.8 1.9*  PROT 4.5* 6.1* 4.9*  ALBUMIN 2.6* 3.5 2.9*   No results for input(s): LIPASE, AMYLASE in the last 8760 hours. No results for input(s): AMMONIA in the last 8760 hours. CBC: Recent Labs    12/18/23 0000 12/27/23 1130 12/27/23 1139 12/28/23 0411 12/28/23 0843 12/29/23 1723 12/30/23 0324 12/31/23 0533  WBC 4.3 5.8  --  8.5   < > 6.7 4.8 4.5  NEUTROABS 2,137.00 4.0  --  7.1  --   --   --   --   HGB 11.0* 9.4*   < > 8.2*   < > 7.6* 7.1* 8.5*  HCT 35* 31.2*   < > 26.7*   < > 24.8* 23.8* 27.9*  MCV  --  99.7  --  97.4   < > 98.8 101.3* 98.2  PLT 184 170  --  121*   < > 145* 135* 160   < > = values in this interval not displayed.   Cardiac Enzymes: No results for input(s): CKTOTAL, CKMB, CKMBINDEX, TROPONINI in the last 8760 hours. BNP: Invalid input(s): POCBNP No results found for: HGBA1C Lab Results  Component Value Date   TSH 2.47 10/08/2022   Lab Results  Component  Value Date   VITAMINB12 373 07/14/2023   Lab Results  Component Value Date   FOLATE 19.6 05/06/2022   Lab Results  Component Value Date   IRON  231 (H) 12/28/2023   TIBC 273 12/28/2023   FERRITIN 27 12/28/2023    Imaging and Procedures obtained prior to SNF admission: IR EMBO ART  VEN HEMORR LYMPH EXTRAV  INC GUIDE ROADMAPPING Result Date: 12/28/2023 INDICATION: Diverticular bleed.  Bright red blood per rectum. EXAM: Title; MESENTERIC ARTERIOGRAPHY and COIL EMBOLIZATION for LOWER GI BLEED Listed procedures; 1. ULTRASOUND GUIDANCE for RIGHT COMMON FEMORAL ARTERIAL ACCESS 2. INFERIOR MESENTERIC ARTERY ARTERIOGRAPHY 3. COIL EMBOLIZATION of a DISTAL LEFT COLIC BRANCH VESSEL at iDESCENDING COLON COMPARISON:  CTA AP, 12/27/2023 and 04/19/2023 MEDICATIONS: None ANESTHESIA/SEDATION: Moderate (conscious) sedation was employed during this procedure. A total of Versed  4 mg and Fentanyl  100 mcg was administered intravenously. Moderate Sedation Time: 60 minutes. The patient's level of consciousness and vital signs were monitored continuously by radiology nursing throughout the procedure under my direct supervision. CONTRAST:  45 mL Omnipaque  300 FLUOROSCOPY: Radiation Exposure Index and estimated peak skin dose (PSD); Reference air kerma (RAK), 211 mGy. COMPLICATIONS: None immediate. PROCEDURE: Informed consent was obtained from the patient and/or patient's representative following explanation of the procedure, risks, benefits and alternatives. All questions were addressed. A time out was performed prior to the initiation of the procedure. Maximal barrier sterile technique utilized including caps, mask, sterile gowns, sterile gloves, large sterile drape, hand hygiene, and Betadine prep. The RIGHT femoral head was marked fluoroscopically. Under sterile conditions and local anesthesia, the RIGHT common femoral artery access was performed with a micropuncture needle. Under direct ultrasound guidance, the RIGHT common  femoral was accessed with a micropuncture kit. An ultrasound image was saved  for documentation purposes. This allowed for placement of a 5 Fr vascular sheath. A limited arteriogram was performed through the side arm of the sheath confirming appropriate access within the RIGHT common femoral artery. Over a Bentson wire, a Mickelson catheter was advanced, reformed, back bled and flushed. The catheter was then utilized to select the inferior mesenteric artery and a selective inferior mesenteric arteriogram was performed. Using a 2.1 Fr Progreat alpha microcatheter and microwire, access into the distal LEFT colic artery branch vessels was performed and selective arteriography was obtained to identify the bleeding vessel. Super selective coil embolization was then performed using a single non detachable micro coil. A post embolization arteriogram was performed without evidence of residual extravasation. Images were reviewed and the procedure was terminated. All wires, catheters and sheaths were removed from the patient. Hemostasis was achieved at the RIGHT groin access site with Angio-Seal closure device. The patient tolerated the procedure well without immediate post procedural complication. FINDINGS: - access via the RIGHT femoral artery. - No overt extravasation at the descending colon with suspected ooze from IMA territory, distal LEFT colic branch vessel - Successful embolization of the target vessel with a single micro coil - Angio-Seal closure at RIGHT groin. Palpable RLE pulses at the end of the Case IMPRESSION: Successful super-selective coil embolization of a target vessel at site of ooze from IMA territory, LEFT colic branch vessel, as above. PLAN: - The patient is to remain flat for 2 hours with RIGHT leg straight. - The patient may continue to experience several additional bloody bowel movements however should clear the rectal vault in the coming days. Thom Hall, MD Vascular and Interventional Radiology  Specialists [facility] Radiology Electronically Signed   By: Thom Hall M.D.   On: 12/28/2023 11:15   IR Angiogram Visceral Selective Result Date: 12/28/2023 INDICATION: Diverticular bleed.  Bright red blood per rectum. EXAM: Title; MESENTERIC ARTERIOGRAPHY and COIL EMBOLIZATION for LOWER GI BLEED Listed procedures; 1. ULTRASOUND GUIDANCE for RIGHT COMMON FEMORAL ARTERIAL ACCESS 2. INFERIOR MESENTERIC ARTERY ARTERIOGRAPHY 3. COIL EMBOLIZATION of a DISTAL LEFT COLIC BRANCH VESSEL at iDESCENDING COLON COMPARISON:  CTA AP, 12/27/2023 and 04/19/2023 MEDICATIONS: None ANESTHESIA/SEDATION: Moderate (conscious) sedation was employed during this procedure. A total of Versed  4 mg and Fentanyl  100 mcg was administered intravenously. Moderate Sedation Time: 60 minutes. The patient's level of consciousness and vital signs were monitored continuously by radiology nursing throughout the procedure under my direct supervision. CONTRAST:  45 mL Omnipaque  300 FLUOROSCOPY: Radiation Exposure Index and estimated peak skin dose (PSD); Reference air kerma (RAK), 211 mGy. COMPLICATIONS: None immediate. PROCEDURE: Informed consent was obtained from the patient and/or patient's representative following explanation of the procedure, risks, benefits and alternatives. All questions were addressed. A time out was performed prior to the initiation of the procedure. Maximal barrier sterile technique utilized including caps, mask, sterile gowns, sterile gloves, large sterile drape, hand hygiene, and Betadine prep. The RIGHT femoral head was marked fluoroscopically. Under sterile conditions and local anesthesia, the RIGHT common femoral artery access was performed with a micropuncture needle. Under direct ultrasound guidance, the RIGHT common femoral was accessed with a micropuncture kit. An ultrasound image was saved for documentation purposes. This allowed for placement of a 5 Fr vascular sheath. A limited arteriogram was performed through  the side arm of the sheath confirming appropriate access within the RIGHT common femoral artery. Over a Bentson wire, a Mickelson catheter was advanced, reformed, back bled and flushed. The catheter was then utilized to select the inferior  mesenteric artery and a selective inferior mesenteric arteriogram was performed. Using a 2.1 Fr Progreat alpha microcatheter and microwire, access into the distal LEFT colic artery branch vessels was performed and selective arteriography was obtained to identify the bleeding vessel. Super selective coil embolization was then performed using a single non detachable micro coil. A post embolization arteriogram was performed without evidence of residual extravasation. Images were reviewed and the procedure was terminated. All wires, catheters and sheaths were removed from the patient. Hemostasis was achieved at the RIGHT groin access site with Angio-Seal closure device. The patient tolerated the procedure well without immediate post procedural complication. FINDINGS: - access via the RIGHT femoral artery. - No overt extravasation at the descending colon with suspected ooze from IMA territory, distal LEFT colic branch vessel - Successful embolization of the target vessel with a single micro coil - Angio-Seal closure at RIGHT groin. Palpable RLE pulses at the end of the Case IMPRESSION: Successful super-selective coil embolization of a target vessel at site of ooze from IMA territory, LEFT colic branch vessel, as above. PLAN: - The patient is to remain flat for 2 hours with RIGHT leg straight. - The patient may continue to experience several additional bloody bowel movements however should clear the rectal vault in the coming days. Thom Hall, MD [facility] Radiology Electronically Signed   By: Thom Hall M.D.   On: 12/28/2023 11:15   IR Angiogram Selective Each Additional Vessel Result Date: 12/28/2023 INDICATION: Diverticular  bleed.  Bright red blood per rectum. EXAM: Title; MESENTERIC ARTERIOGRAPHY and COIL EMBOLIZATION for LOWER GI BLEED Listed procedures; 1. ULTRASOUND GUIDANCE for RIGHT COMMON FEMORAL ARTERIAL ACCESS 2. INFERIOR MESENTERIC ARTERY ARTERIOGRAPHY 3. COIL EMBOLIZATION of a DISTAL LEFT COLIC BRANCH VESSEL at iDESCENDING COLON COMPARISON:  CTA AP, 12/27/2023 and 04/19/2023 MEDICATIONS: None ANESTHESIA/SEDATION: Moderate (conscious) sedation was employed during this procedure. A total of Versed  4 mg and Fentanyl  100 mcg was administered intravenously. Moderate Sedation Time: 60 minutes. The patient's level of consciousness and vital signs were monitored continuously by radiology nursing throughout the procedure under my direct supervision. CONTRAST:  45 mL Omnipaque  300 FLUOROSCOPY: Radiation Exposure Index and estimated peak skin dose (PSD); Reference air kerma (RAK), 211 mGy. COMPLICATIONS: None immediate. PROCEDURE: Informed consent was obtained from the patient and/or patient's representative following explanation of the procedure, risks, benefits and alternatives. All questions were addressed. A time out was performed prior to the initiation of the procedure. Maximal barrier sterile technique utilized including caps, mask, sterile gowns, sterile gloves, large sterile drape, hand hygiene, and Betadine prep. The RIGHT femoral head was marked fluoroscopically. Under sterile conditions and local anesthesia, the RIGHT common femoral artery access was performed with a micropuncture needle. Under direct ultrasound guidance, the RIGHT common femoral was accessed with a micropuncture kit. An ultrasound image was saved for documentation purposes. This allowed for placement of a 5 Fr vascular sheath. A limited arteriogram was performed through the side arm of the sheath confirming appropriate access within the RIGHT common femoral artery. Over a Bentson wire, a Mickelson catheter was advanced, reformed, back bled and flushed. The  catheter was then utilized to select the inferior mesenteric artery and a selective inferior mesenteric arteriogram was performed. Using a 2.1 Fr Progreat alpha microcatheter and microwire, access into the distal LEFT colic artery branch vessels was performed and selective arteriography was obtained to identify the bleeding vessel. Super selective coil embolization was then performed using a single non detachable micro coil. A  post embolization arteriogram was performed without evidence of residual extravasation. Images were reviewed and the procedure was terminated. All wires, catheters and sheaths were removed from the patient. Hemostasis was achieved at the RIGHT groin access site with Angio-Seal closure device. The patient tolerated the procedure well without immediate post procedural complication. FINDINGS: - access via the RIGHT femoral artery. - No overt extravasation at the descending colon with suspected ooze from IMA territory, distal LEFT colic branch vessel - Successful embolization of the target vessel with a single micro coil - Angio-Seal closure at RIGHT groin. Palpable RLE pulses at the end of the Case IMPRESSION: Successful super-selective coil embolization of a target vessel at site of ooze from IMA territory, LEFT colic branch vessel, as above. PLAN: - The patient is to remain flat for 2 hours with RIGHT leg straight. - The patient may continue to experience several additional bloody bowel movements however should clear the rectal vault in the coming days. Thom Hall, MD [facility] Radiology Electronically Signed   By: Thom Hall M.D.   On: 12/28/2023 11:15   IR US  Guide Vasc Access Right Result Date: 12/28/2023 INDICATION: Diverticular bleed.  Bright red blood per rectum. EXAM: Title; MESENTERIC ARTERIOGRAPHY and COIL EMBOLIZATION for LOWER GI BLEED Listed procedures; 1. ULTRASOUND GUIDANCE for RIGHT COMMON FEMORAL ARTERIAL ACCESS 2.  INFERIOR MESENTERIC ARTERY ARTERIOGRAPHY 3. COIL EMBOLIZATION of a DISTAL LEFT COLIC BRANCH VESSEL at iDESCENDING COLON COMPARISON:  CTA AP, 12/27/2023 and 04/19/2023 MEDICATIONS: None ANESTHESIA/SEDATION: Moderate (conscious) sedation was employed during this procedure. A total of Versed  4 mg and Fentanyl  100 mcg was administered intravenously. Moderate Sedation Time: 60 minutes. The patient's level of consciousness and vital signs were monitored continuously by radiology nursing throughout the procedure under my direct supervision. CONTRAST:  45 mL Omnipaque  300 FLUOROSCOPY: Radiation Exposure Index and estimated peak skin dose (PSD); Reference air kerma (RAK), 211 mGy. COMPLICATIONS: None immediate. PROCEDURE: Informed consent was obtained from the patient and/or patient's representative following explanation of the procedure, risks, benefits and alternatives. All questions were addressed. A time out was performed prior to the initiation of the procedure. Maximal barrier sterile technique utilized including caps, mask, sterile gowns, sterile gloves, large sterile drape, hand hygiene, and Betadine prep. The RIGHT femoral head was marked fluoroscopically. Under sterile conditions and local anesthesia, the RIGHT common femoral artery access was performed with a micropuncture needle. Under direct ultrasound guidance, the RIGHT common femoral was accessed with a micropuncture kit. An ultrasound image was saved for documentation purposes. This allowed for placement of a 5 Fr vascular sheath. A limited arteriogram was performed through the side arm of the sheath confirming appropriate access within the RIGHT common femoral artery. Over a Bentson wire, a Mickelson catheter was advanced, reformed, back bled and flushed. The catheter was then utilized to select the inferior mesenteric artery and a selective inferior mesenteric arteriogram was performed. Using a 2.1 Fr Progreat alpha microcatheter and microwire, access into  the distal LEFT colic artery branch vessels was performed and selective arteriography was obtained to identify the bleeding vessel. Super selective coil embolization was then performed using a single non detachable micro coil. A post embolization arteriogram was performed without evidence of residual extravasation. Images were reviewed and the procedure was terminated. All wires, catheters and sheaths were removed from the patient. Hemostasis was achieved at the RIGHT groin access site with Angio-Seal closure device. The patient tolerated the procedure well without immediate post procedural complication. FINDINGS: - access  via the RIGHT femoral artery. - No overt extravasation at the descending colon with suspected ooze from IMA territory, distal LEFT colic branch vessel - Successful embolization of the target vessel with a single micro coil - Angio-Seal closure at RIGHT groin. Palpable RLE pulses at the end of the Case IMPRESSION: Successful super-selective coil embolization of a target vessel at site of ooze from IMA territory, LEFT colic branch vessel, as above. PLAN: - The patient is to remain flat for 2 hours with RIGHT leg straight. - The patient may continue to experience several additional bloody bowel movements however should clear the rectal vault in the coming days. Thom Hall, MD [facility] Radiology Electronically Signed   By: Thom Hall M.D.   On: 12/28/2023 11:15   CT ANGIO GI BLEED Result Date: 12/27/2023 CLINICAL DATA:  Rectal bleeding.  Left upper abdominal pain. EXAM: CTA ABDOMEN AND PELVIS WITHOUT AND WITH CONTRAST TECHNIQUE: Multidetector CT imaging of the abdomen and pelvis was performed using the standard protocol during bolus administration of intravenous contrast. Multiplanar reconstructed images and MIPs were obtained and reviewed to evaluate the vascular anatomy. RADIATION DOSE REDUCTION: This exam was performed according to the  departmental dose-optimization program which includes automated exposure control, adjustment of the mA and/or kV according to patient size and/or use of iterative reconstruction technique. CONTRAST:  OMNIPAQUE  IOHEXOL  350 MG/ML SOLN COMPARISON:  None Available. FINDINGS: VASCULAR Aorta: Atherosclerotic disease in the abdominal aorta without aneurysm or dissection or significant stenosis. Celiac: Patent without evidence of aneurysm, dissection, vasculitis or significant stenosis. SMA: Patent without evidence of aneurysm, dissection, vasculitis or significant stenosis. Renals: Single bilateral renal arteries. Left renal artery is widely patent without aneurysm, dissection or stenosis. At least mild stenosis at origin of the right renal artery without aneurysm or dissection. IMA: Patent without evidence of aneurysm, dissection, vasculitis or significant stenosis. 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 Inflow: Patent without evidence of aneurysm, dissection, vasculitis or significant stenosis. Proximal Outflow: Proximal femoral arteries are patent bilaterally. Veins: Portal venous system is patent. Normal appearance of the IVC and renal veins. Normal appearance of the iliac veins. Review of the MIP images confirms the above findings. NON-VASCULAR Lower chest: Peripheral reticular densities at the lung bases are suggestive for chronic changes. No pleural effusions. Hepatobiliary: Gallbladder is mildly distended with high-density sludge or small stones. No gallbladder inflammation. Main portal venous system is patent. No biliary dilatation. Pancreas: Unremarkable. No pancreatic ductal dilatation or surrounding inflammatory changes. Spleen: Normal in size without focal abnormality. Adrenals/Urinary Tract: Normal adrenal glands. No kidney stones. Cyst in the left kidney upper pole. No hydronephrosis. No suspicious renal lesion. Normal urinary bladder.  Stomach/Bowel: Positive for active GI bleeding involving the descending colon. 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 The area of arterial bleeding in the colon is distal to the previously embolized area. Extensive diverticular disease throughout the colon. Endoscopic clip in the right colon near the ileocecal valve. Normal appendix. Normal stomach. No bowel dilatation or obstruction. Lymphatic: No lymph node enlargement in the abdomen or pelvis. Reproductive: Uterus and bilateral adnexa are unremarkable. Other: Negative for ascites.  Negative for free air. Musculoskeletal: No acute bone abnormality. Old vertebral body compression fracture at L1. IMPRESSION: 1. Positive for active GI bleeding involving the descending colon. Findings are most compatible with a diverticular bleed. 2. Endovascular coils involving left colic artery/marginal artery  near the splenic flexure. 3. Extensive diverticular disease throughout the colon. 4. Gallbladder sludge or small stones. No gallbladder inflammation. 5. Aortic Atherosclerosis (ICD10-I70.0). These results were called by telephone at the time of interpretation on 12/27/2023 at 5:12 pm to provider Dr. Efrain, who verbally acknowledged these results. Electronically Signed   By: Juliene Balder M.D.   On: 12/27/2023 20:33    Assessment/Plan  History of lower GI bleeding Patient denies abdominal pain nausea, vomiting, bleeding episodes Repeat hemoglobin 10 Continue with pantoprazole  Follow-up with GI  History of COPD Patient is able to speak in full sentences Does not appear to be in distress No wheezing on auscultation Continue with Ellipta  History of CHF Euvolemic on exam Continue with torsemide  Avoid salty foods  Hyperlipidemia Continue with Zocor   Restless leg syndrome Continue with Requip   Urinary incontinence Continue with oxybutynin .   30 min Total time spent for obtaining history,   performing a medically appropriate examination and evaluation, reviewing the tests, ,  documenting clinical information in the electronic or other health record,   ,care coordination (not separately reported)

## 2024-01-09 ENCOUNTER — Encounter: Payer: Self-pay | Admitting: Sports Medicine

## 2024-01-14 ENCOUNTER — Non-Acute Institutional Stay (SKILLED_NURSING_FACILITY): Payer: Self-pay | Admitting: Orthopedic Surgery

## 2024-01-14 ENCOUNTER — Encounter: Payer: Self-pay | Admitting: Orthopedic Surgery

## 2024-01-14 DIAGNOSIS — N3000 Acute cystitis without hematuria: Secondary | ICD-10-CM

## 2024-01-14 DIAGNOSIS — D500 Iron deficiency anemia secondary to blood loss (chronic): Secondary | ICD-10-CM

## 2024-01-14 NOTE — Progress Notes (Signed)
 Location:   [facility]  Nursing Home Room Number: 20-A Place of Service:  SNF (31) Provider:  Greig Cluster, NP  PCP: Cluster Greig BRAVO, NP  Patient Care Team: Cluster Greig BRAVO, NP as PCP - General (Adult Health Nurse Practitioner) Charlanne Fredia CROME, MD as Consulting Physician (Internal Medicine) Apickup-Ot, A, OT as Occupational Therapist (Occupational Therapy)  Extended Emergency Contact Information Primary Emergency Contact: Rowlette,[name] Address: [address]          [place], [place] [address] [place] Home Phone: [phone] Mobile Phone: [phone] Relation: Spouse Secondary Emergency Contact: Scafidi MD,[name] Ozell Crane Phone: [phone] Relation: Son Interpreter needed? No  Code Status:  DNR Goals of care: Advanced Directive information    01/14/2024   10:21 AM  Advanced Directives  Does Patient Have a Medical Advance Directive? Yes  Type of Advance Directive Out of facility DNR (pink MOST or yellow form)  Does patient want to make changes to medical advance directive? No - Patient declined     Chief Complaint  Patient presents with   Urinary Frequency    HPI:  Pt is a 83 y.o. female seen today for acute visit due to urinary frequency.   H/o urinary urgency and taking oxybutynin . 07/08 UA/culture obtained due to urinary frequency. 07/10 she was discharged from [facility] rehab unit and back to [facility] assisted living. She continues to have increased frequency at times. Denies dysuria, malaise or flank pain. Urine culture > 100,000 cfu/mL E. Coli. She is trying to drink more water during the day. Afebrile. Vitals stable.      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, left side   Sleeping excessive    Tinnitus of both ears    Past Surgical History:  Procedure Laterality Date   BALLOON DILATION N/A 05/08/2021   Procedure: BALLOON DILATION;  Surgeon: Saintclair Jasper, MD;  Location: [facility] ENDOSCOPY;  Service: Gastroenterology;  Laterality: N/A;   BASAL CELL CARCINOMA EXCISION  2001   off my nose, left side   BIOPSY  05/08/2021   Procedure: BIOPSY;  Surgeon: Saintclair Jasper, MD;  Location: [facility] ENDOSCOPY;  Service: Gastroenterology;;   BIOPSY  04/25/2023   Procedure: BIOPSY;  Surgeon: Dianna Specking, MD;  Location: [facility] ENDOSCOPY;  Service: Gastroenterology;;   BLEPHAROPLASTY Bilateral    BONE BIOPSY  10/26/2023   Procedure: BIOPSY, GI;  Surgeon: Elicia Claw, MD;  Location: [facility] ENDOSCOPY;  Service: Gastroenterology;;   CATARACT EXTRACTION W/ INTRAOCULAR LENS  IMPLANT, BILATERAL Bilateral 1990's   COLONOSCOPY N/A 10/26/2023   Procedure: COLONOSCOPY;  Surgeon: Elicia Claw, MD;  Location: [facility] ENDOSCOPY;  Service: Gastroenterology;  Laterality: N/A;   COLONOSCOPY WITH PROPOFOL  N/A 05/04/2022   Procedure: COLONOSCOPY WITH PROPOFOL ;  Surgeon: Saintclair Jasper, MD;  Location: [facility] ENDOSCOPY;  Service: Gastroenterology;  Laterality: N/A;   COLONOSCOPY WITH PROPOFOL  N/A 04/25/2023  Procedure: COLONOSCOPY WITH PROPOFOL ;  Surgeon: Dianna Specking, MD;  Location: [facility] ENDOSCOPY;  Service: Gastroenterology;  Laterality: N/A;   ENTEROSCOPY N/A 11/20/2023   Procedure: ENTEROSCOPY;  Surgeon: Dianna Specking, MD;  Location: [facility] ENDOSCOPY;  Service: Gastroenterology;  Laterality: N/A;   ESOPHAGOGASTRODUODENOSCOPY N/A 10/26/2023   Procedure: EGD (ESOPHAGOGASTRODUODENOSCOPY);  Surgeon: Elicia Claw, MD;  Location: [facility] ENDOSCOPY;   Service: Gastroenterology;  Laterality: N/A;   ESOPHAGOGASTRODUODENOSCOPY N/A 11/20/2023   Procedure: EGD (ESOPHAGOGASTRODUODENOSCOPY);  Surgeon: Dianna Specking, MD;  Location: [facility] ENDOSCOPY;  Service: Gastroenterology;  Laterality: N/A;   ESOPHAGOGASTRODUODENOSCOPY (EGD) WITH PROPOFOL  N/A 05/08/2021   Procedure: ESOPHAGOGASTRODUODENOSCOPY (EGD) WITH PROPOFOL ;  Surgeon: Saintclair Jasper, MD;  Location: [facility] ENDOSCOPY;  Service: Gastroenterology;  Laterality: N/A;   ESOPHAGOGASTRODUODENOSCOPY (EGD) WITH PROPOFOL  N/A 01/11/2022   Procedure: ESOPHAGOGASTRODUODENOSCOPY (EGD) WITH PROPOFOL ;  Surgeon: Rosalie Kitchens, MD;  Location: [facility] ENDOSCOPY;  Service: Gastroenterology;  Laterality: N/A;   ESOPHAGOGASTRODUODENOSCOPY (EGD) WITH PROPOFOL  N/A 01/06/2023   Procedure: ESOPHAGOGASTRODUODENOSCOPY (EGD) WITH PROPOFOL ;  Surgeon: Rosalie Kitchens, MD;  Location: [facility] ENDOSCOPY;  Service: Gastroenterology;  Laterality: N/A;   ESOPHAGOGASTRODUODENOSCOPY (EGD) WITH PROPOFOL  N/A 04/21/2023   Procedure: ESOPHAGOGASTRODUODENOSCOPY (EGD) WITH PROPOFOL ;  Surgeon: Dianna Specking, MD;  Location: [facility] ENDOSCOPY;  Service: Gastroenterology;  Laterality: N/A;   EYE SURGERY Bilateral    to improve vision after cataract OR   GIVENS CAPSULE STUDY N/A 04/21/2023   Procedure: GIVENS CAPSULE STUDY;  Surgeon: Dianna Specking, MD;  Location: [facility] ENDOSCOPY;  Service: Gastroenterology;  Laterality: N/A;   GIVENS CAPSULE STUDY N/A 11/18/2023   Procedure: IMAGING PROCEDURE, GI TRACT, INTRALUMINAL, VIA CAPSULE;  Surgeon: Dianna Specking, MD;  Location: [facility] ENDOSCOPY;  Service: Gastroenterology;  Laterality: N/A;   HEMOSTASIS CLIP PLACEMENT  10/26/2023   Procedure: CONTROL OF HEMORRHAGE, GI TRACT, ENDOSCOPIC, BY CLIPPING OR OVERSEWING;  Surgeon: Elicia Claw, MD;  Location: [facility] ENDOSCOPY;  Service: Gastroenterology;;   IR ANGIOGRAM FOLLOW UP STUDY  12/19/2020   IR ANGIOGRAM SELECTIVE EACH ADDITIONAL VESSEL  12/19/2020   IR ANGIOGRAM SELECTIVE EACH  ADDITIONAL VESSEL  12/19/2020   IR ANGIOGRAM SELECTIVE EACH ADDITIONAL VESSEL  12/28/2023   IR ANGIOGRAM VISCERAL SELECTIVE  12/19/2020   IR ANGIOGRAM VISCERAL SELECTIVE  12/28/2023   IR EMBO ART  VEN HEMORR LYMPH EXTRAV  INC GUIDE ROADMAPPING  12/19/2020   IR EMBO ART  VEN HEMORR LYMPH EXTRAV  INC GUIDE ROADMAPPING  12/28/2023   IR US  GUIDE VASC ACCESS RIGHT  12/19/2020   IR US  GUIDE VASC ACCESS RIGHT  12/28/2023   JOINT REPLACEMENT     POLYPECTOMY  05/04/2022   Procedure: POLYPECTOMY;  Surgeon: Saintclair Jasper, MD;  Location: [facility] ENDOSCOPY;  Service: Gastroenterology;;   POLYPECTOMY  10/26/2023   Procedure: POLYPECTOMY, INTESTINE;  Surgeon: Elicia Claw, MD;  Location: [facility] ENDOSCOPY;  Service: Gastroenterology;;   STAPEDES SURGERY Left    scraped stapedes because it was sticking when it wasn't suppose to   TONSILLECTOMY AND ADENOIDECTOMY  1946   TOTAL KNEE ARTHROPLASTY Left ~ 2008   TOTAL KNEE ARTHROPLASTY  12/20/2011   Procedure: TOTAL KNEE ARTHROPLASTY;  Surgeon: Elspeth JONELLE Her, MD;  Location: [facility] OR;  Service: Orthopedics;  Laterality: Right;  Right Total Knee Arthroplasty   TUBAL LIGATION  1980's    Allergies  Allergen Reactions   Morphine And Codeine Itching   Aspirin Other (See Comments)    Unknown reaction   Duloxetine  Hcl Other (See Comments)    Unknown reaction   Tymlos [Abaloparatide] Other (See Comments)    Unknown reaction  Ventolin  [Albuterol ] Other (See Comments)    rapid heart beat   Cefadroxil Hives    Patient can take amoxicillin  and cipro     Allergies as of 01/14/2024       Reactions   Morphine And Codeine Itching   Aspirin Other (See Comments)   Unknown reaction   Duloxetine  Hcl Other (See Comments)   Unknown reaction   Tymlos [abaloparatide] Other (See Comments)   Unknown reaction   Ventolin  [albuterol ] Other (See Comments)   rapid heart beat   Cefadroxil Hives   Patient can take amoxicillin  and cipro         Medication List        Accurate as of  January 14, 2024 10:48 AM. If you have any questions, ask your nurse or doctor.          albuterol  108 (90 Base) MCG/ACT inhaler Commonly known as: VENTOLIN  HFA Inhale 2 puffs into the lungs every 4 (four) hours as needed for wheezing or shortness of breath.   alendronate 70 MG tablet Commonly known as: FOSAMAX Take 70 mg by mouth every Sunday. Take with a full glass of water on an empty stomach.   Align 4 MG Caps Take 4 mg by mouth at bedtime.   Anoro Ellipta  62.5-25 MCG/ACT Aepb Generic drug: umeclidinium-vilanterol Inhale 1 puff into the lungs every morning.   cetirizine  5 MG chewable tablet Commonly known as: ZYRTEC  Chew 5 mg by mouth daily as needed for allergies.   ciprofloxacin  250 MG tablet Commonly known as: CIPRO  Take 250 mg by mouth 2 (two) times daily.   cyanocobalamin  1000 MCG tablet Commonly known as: VITAMIN B12 Take 1,000 mcg by mouth daily.   Dorzolamide  HCl-Timolol  Mal PF 2-0.5 % Soln Place 1 drop into both eyes in the morning and at bedtime.   ferrous gluconate  240 (27 FE) MG tablet Commonly known as: FERGON Take 240 mg by mouth every morning. Take without food   fluticasone  50 MCG/ACT nasal spray Commonly known as: FLONASE  Place 1 spray into both nostrils as needed for allergies or rhinitis.   guaifenesin  100 MG/5ML syrup Commonly known as: ROBITUSSIN Take 10 mLs by mouth every 4 (four) hours as needed for cough.   latanoprost  0.005 % ophthalmic solution Commonly known as: XALATAN  Place 1 drop into both eyes at bedtime.   MIRALAX  PO Take 8.6 g by mouth daily as needed.   oxybutynin  10 MG 24 hr tablet Commonly known as: DITROPAN -XL Take 10 mg by mouth at bedtime.   pantoprazole  40 MG tablet Commonly known as: PROTONIX  Take 1 tablet (40 mg total) by mouth 2 (two) times daily before a meal.   potassium chloride  20 MEQ/15ML (10%) Soln Take 15 mLs by mouth daily.   PreserVision AREDS 2 Caps Take 1 capsule by mouth 2 (two) times daily.    PreviDent 5000 Booster Plus 1.1 % Pste Generic drug: Sodium Fluoride  Place 1 Application onto teeth See admin instructions. Brush on teeth with a toothbrush after evening mouth care. Spit out excess and do not rinse.   Rhopressa  0.02 % Soln Generic drug: Netarsudil  Dimesylate Place 1 drop into both eyes at bedtime.   rOPINIRole  0.5 MG tablet Commonly known as: REQUIP  Take 1 tablet (0.5 mg total) by mouth daily as needed.   senna 8.6 MG Tabs tablet Commonly known as: SENOKOT Take 2 tablets by mouth at bedtime.   sertraline  50 MG tablet Commonly known as: ZOLOFT  Take 1.5 tablets (75 mg total) by mouth daily.  simvastatin  20 MG tablet Commonly known as: ZOCOR  Take 20 mg by mouth every evening.   torsemide  20 MG tablet Commonly known as: DEMADEX  Take 20 mg by mouth every morning.   traMADol  HCl 25 MG Tabs Take 25 mg by mouth 2 (two) times daily.   TYLENOL  PO Take 1,000 mg by mouth as needed.   acetaminophen  650 MG CR tablet Commonly known as: TYLENOL  Take 1,300 mg by mouth 2 (two) times daily.   vitamin C  1000 MG tablet Take 1,000 mg by mouth every morning.   Vitamin D3 50 MCG (2000 UT) Tabs Take 2,000 Units by mouth every morning.   zinc oxide 20 % ointment Apply 1 Application topically as needed for irritation.        Review of Systems  Constitutional:  Negative for fatigue and fever.  HENT:  Positive for trouble swallowing. Negative for sore throat.   Cardiovascular:  Negative for chest pain and leg swelling.  Gastrointestinal:  Negative for abdominal distention, abdominal pain and blood in stool.  Genitourinary:  Positive for frequency and urgency. Negative for dysuria and hematuria.  Musculoskeletal:  Positive for gait problem.  Skin:  Negative for wound.  Neurological:  Positive for weakness. Negative for dizziness and headaches.  Psychiatric/Behavioral:  Positive for dysphoric mood. Negative for sleep disturbance. The patient is nervous/anxious.      Immunization History  Administered Date(s) Administered   Dtap, Unspecified 03/12/2018   Fluad Quad(high Dose 65+) 04/24/2022   Fluad Trivalent(High Dose 65+) 04/27/2023   Fluzone Influenza virus vaccine,trivalent (IIV3), split virus 09/22/2009, 03/29/2010, 07/23/2011, 04/03/2012, 03/30/2014   Influenza Inj Mdck Quad Pf 04/12/2017   Influenza, High Dose Seasonal PF 04/15/2013, 03/14/2018   Influenza, Mdck, Trivalent,PF 6+ MOS(egg free) 03/16/2016, 04/12/2017   Influenza, Quadrivalent, Recombinant, Inj, Pf 03/05/2019, 05/22/2021   Influenza,inj,Quad PF,6+ Mos 03/30/2014, 03/25/2015   PFIZER(Purple Top)SARS-COV-2 Vaccination 07/25/2019, 08/16/2019, 03/31/2020, 03/31/2021, 12/12/2021   Pneumococcal Conjugate-13 10/19/2013, 11/01/2014   Pneumococcal Polysaccharide-23 09/22/2009, 05/01/2010, 07/23/2011   Pneumococcal-Unspecified 03/02/2011   Td (Adult),5 Lf Tetanus Toxid, Preservative Free 07/23/2011   Tdap 09/22/2009, 07/23/2011, 03/12/2018   Zoster Recombinant(Shingrix) 10/08/2017, 12/09/2017   Zoster, Live 09/22/2009, 07/23/2011, 12/16/2017   Pertinent  Health Maintenance Due  Topic Date Due   DEXA SCAN  Never done   INFLUENZA VACCINE  01/30/2024      10/30/2022   12:44 PM 01/22/2023    1:15 PM 04/04/2023   11:00 AM 07/10/2023    9:02 AM 10/24/2023    2:27 PM  Fall Risk  Falls in the past year? 0 0 0 0 0  Was there an injury with Fall? 0 0 0 0 0  Fall Risk Category Calculator 0 0 0 0 0  Patient at Risk for Falls Due to History of fall(s);Impaired balance/gait;Impaired mobility History of fall(s);Impaired balance/gait History of fall(s);Impaired balance/gait;Impaired mobility History of fall(s);Impaired balance/gait;Impaired mobility History of fall(s);Impaired balance/gait;Impaired vision  Fall risk Follow up Falls evaluation completed;Education provided;Falls prevention discussed Falls evaluation completed;Education provided;Falls prevention discussed Falls evaluation  completed;Education provided;Falls prevention discussed Falls evaluation completed;Education provided;Falls prevention discussed Falls evaluation completed;Education provided   Functional Status Survey:    Vitals:   01/14/24 1021  BP: 120/75  Pulse: 88  Resp: 18  Temp: (!) 97.3 F (36.3 C)  SpO2: 92%  Weight: 163 lb 6.4 oz (74.1 kg)  Height: 5' 1 (1.549 m)   Body mass index is 30.87 kg/m. Physical Exam Vitals reviewed.  Constitutional:      General: She is not  in acute distress.    Comments: Pale complexion  HENT:     Head: Normocephalic.  Eyes:     General:        Right eye: No discharge.        Left eye: No discharge.  Cardiovascular:     Rate and Rhythm: Normal rate and regular rhythm.     Pulses: Normal pulses.     Heart sounds: Normal heart sounds.  Pulmonary:     Effort: Pulmonary effort is normal.     Breath sounds: Normal breath sounds.  Abdominal:     General: Bowel sounds are normal. There is no distension.     Palpations: Abdomen is soft.     Tenderness: There is no abdominal tenderness.  Musculoskeletal:     Cervical back: Neck supple.     Right lower leg: No edema.     Left lower leg: No edema.  Skin:    General: Skin is warm.     Capillary Refill: Capillary refill takes less than 2 seconds.  Neurological:     General: No focal deficit present.     Mental Status: She is alert and oriented to person, place, and time.     Gait: Gait abnormal.  Psychiatric:        Mood and Affect: Mood normal.     Labs reviewed: Recent Labs    11/18/23 1025 11/19/23 0302 11/19/23 0904 11/20/23 0254 11/21/23 0310 12/28/23 0411 12/29/23 0247 12/30/23 0324 12/31/23 0533  NA 138   < >  --  141   < > 140 139 139 140  K 3.0*   < >  --  3.8   < > 3.4* 3.6 3.4* 3.7  CL 105   < >  --  112*   < > 107 109 109 111  CO2 25   < >  --  23   < > 25 23 23 24   GLUCOSE 148*   < >  --  130*   < > 119* 99 122* 118*  BUN 25*   < >  --  13   < > 10 9 7* <5*  CREATININE  0.57   < >  --  <0.30*   < > 0.45 0.36* 0.40* 0.34*  CALCIUM  7.4*   < >  --  7.9*   < > 7.7* 8.2* 8.1* 8.4*  MG 1.9  --   --  2.0   < > 1.9 2.5* 2.3  --   PHOS 2.6  --  2.2* 1.8*  --   --   --   --   --    < > = values in this interval not displayed.   Recent Labs    11/19/23 0302 12/27/23 1130 12/28/23 0411  AST 14* 23 20  ALT 8 8 9   ALKPHOS 29* 65 49  BILITOT 0.8 0.8 1.9*  PROT 4.5* 6.1* 4.9*  ALBUMIN 2.6* 3.5 2.9*   Recent Labs    12/18/23 0000 12/27/23 1130 12/27/23 1139 12/28/23 0411 12/28/23 0843 12/29/23 1723 12/30/23 0324 12/31/23 0533  WBC 4.3 5.8  --  8.5   < > 6.7 4.8 4.5  NEUTROABS 2,137.00 4.0  --  7.1  --   --   --   --   HGB 11.0* 9.4*   < > 8.2*   < > 7.6* 7.1* 8.5*  HCT 35* 31.2*   < > 26.7*   < > 24.8* 23.8* 27.9*  MCV  --  99.7  --  97.4   < > 98.8 101.3* 98.2  PLT 184 170  --  121*   < > 145* 135* 160   < > = values in this interval not displayed.   Lab Results  Component Value Date   TSH 2.47 10/08/2022   No results found for: HGBA1C Lab Results  Component Value Date   CHOL 125 07/14/2023   HDL 51 07/14/2023   LDLCALC 53 07/14/2023   TRIG 130 07/14/2023    Significant Diagnostic Results in last 30 days:  IR EMBO ART  VEN HEMORR LYMPH EXTRAV  INC GUIDE ROADMAPPING Result Date: 12/28/2023 INDICATION: Diverticular bleed.  Bright red blood per rectum. EXAM: Title; MESENTERIC ARTERIOGRAPHY and COIL EMBOLIZATION for LOWER GI BLEED Listed procedures; 1. ULTRASOUND GUIDANCE for RIGHT COMMON FEMORAL ARTERIAL ACCESS 2. INFERIOR MESENTERIC ARTERY ARTERIOGRAPHY 3. COIL EMBOLIZATION of a DISTAL LEFT COLIC BRANCH VESSEL at iDESCENDING COLON COMPARISON:  CTA AP, 12/27/2023 and 04/19/2023 MEDICATIONS: None ANESTHESIA/SEDATION: Moderate (conscious) sedation was employed during this procedure. A total of Versed  4 mg and Fentanyl  100 mcg was administered intravenously. Moderate Sedation Time: 60 minutes. The patient's level of consciousness and vital signs were  monitored continuously by radiology nursing throughout the procedure under my direct supervision. CONTRAST:  45 mL Omnipaque  300 FLUOROSCOPY: Radiation Exposure Index and estimated peak skin dose (PSD); Reference air kerma (RAK), 211 mGy. COMPLICATIONS: None immediate. PROCEDURE: Informed consent was obtained from the patient and/or patient's representative following explanation of the procedure, risks, benefits and alternatives. All questions were addressed. A time out was performed prior to the initiation of the procedure. Maximal barrier sterile technique utilized including caps, mask, sterile gowns, sterile gloves, large sterile drape, hand hygiene, and Betadine prep. The RIGHT femoral head was marked fluoroscopically. Under sterile conditions and local anesthesia, the RIGHT common femoral artery access was performed with a micropuncture needle. Under direct ultrasound guidance, the RIGHT common femoral was accessed with a micropuncture kit. An ultrasound image was saved for documentation purposes. This allowed for placement of a 5 Fr vascular sheath. A limited arteriogram was performed through the side arm of the sheath confirming appropriate access within the RIGHT common femoral artery. Over a Bentson wire, a Mickelson catheter was advanced, reformed, back bled and flushed. The catheter was then utilized to select the inferior mesenteric artery and a selective inferior mesenteric arteriogram was performed. Using a 2.1 Fr Progreat alpha microcatheter and microwire, access into the distal LEFT colic artery branch vessels was performed and selective arteriography was obtained to identify the bleeding vessel. Super selective coil embolization was then performed using a single non detachable micro coil. A post embolization arteriogram was performed without evidence of residual extravasation. Images were reviewed and the procedure was terminated. All wires, catheters and sheaths were removed from the patient.  Hemostasis was achieved at the RIGHT groin access site with Angio-Seal closure device. The patient tolerated the procedure well without immediate post procedural complication. FINDINGS: - access via the RIGHT femoral artery. - No overt extravasation at the descending colon with suspected ooze from IMA territory, distal LEFT colic branch vessel - Successful embolization of the target vessel with a single micro coil - Angio-Seal closure at RIGHT groin. Palpable RLE pulses at the end of the Case IMPRESSION: Successful super-selective coil embolization of a target vessel at site of ooze from IMA territory, LEFT colic branch vessel, as above. PLAN: - The patient is to remain flat for 2 hours with RIGHT leg straight. - The  patient may continue to experience several additional bloody bowel movements however should clear the rectal vault in the coming days. Thom Hall, MD [facility] Electronically Signed   By: Thom Hall M.D.   On: 12/28/2023 11:15   IR Angiogram Visceral Selective Result Date: 12/28/2023 INDICATION: Diverticular bleed.  Bright red blood per rectum. EXAM: Title; MESENTERIC ARTERIOGRAPHY and COIL EMBOLIZATION for LOWER GI BLEED Listed procedures; 1. ULTRASOUND GUIDANCE for RIGHT COMMON FEMORAL ARTERIAL ACCESS 2. INFERIOR MESENTERIC ARTERY ARTERIOGRAPHY 3. COIL EMBOLIZATION of a DISTAL LEFT COLIC BRANCH VESSEL at iDESCENDING COLON COMPARISON:  CTA AP, 12/27/2023 and 04/19/2023 MEDICATIONS: None ANESTHESIA/SEDATION: Moderate (conscious) sedation was employed during this procedure. A total of Versed  4 mg and Fentanyl  100 mcg was administered intravenously. Moderate Sedation Time: 60 minutes. The patient's level of consciousness and vital signs were monitored continuously by radiology nursing throughout the procedure under my direct supervision. CONTRAST:  45 mL Omnipaque  300 FLUOROSCOPY: Radiation Exposure Index and estimated peak skin dose  (PSD); Reference air kerma (RAK), 211 mGy. COMPLICATIONS: None immediate. PROCEDURE: Informed consent was obtained from the patient and/or patient's representative following explanation of the procedure, risks, benefits and alternatives. All questions were addressed. A time out was performed prior to the initiation of the procedure. Maximal barrier sterile technique utilized including caps, mask, sterile gowns, sterile gloves, large sterile drape, hand hygiene, and Betadine prep. The RIGHT femoral head was marked fluoroscopically. Under sterile conditions and local anesthesia, the RIGHT common femoral artery access was performed with a micropuncture needle. Under direct ultrasound guidance, the RIGHT common femoral was accessed with a micropuncture kit. An ultrasound image was saved for documentation purposes. This allowed for placement of a 5 Fr vascular sheath. A limited arteriogram was performed through the side arm of the sheath confirming appropriate access within the RIGHT common femoral artery. Over a Bentson wire, a Mickelson catheter was advanced, reformed, back bled and flushed. The catheter was then utilized to select the inferior mesenteric artery and a selective inferior mesenteric arteriogram was performed. Using a 2.1 Fr Progreat alpha microcatheter and microwire, access into the distal LEFT colic artery branch vessels was performed and selective arteriography was obtained to identify the bleeding vessel. Super selective coil embolization was then performed using a single non detachable micro coil. A post embolization arteriogram was performed without evidence of residual extravasation. Images were reviewed and the procedure was terminated. All wires, catheters and sheaths were removed from the patient. Hemostasis was achieved at the RIGHT groin access site with Angio-Seal closure device. The patient tolerated the procedure well without immediate post procedural complication. FINDINGS: - access via  the RIGHT femoral artery. - No overt extravasation at the descending colon with suspected ooze from IMA territory, distal LEFT colic branch vessel - Successful embolization of the target vessel with a single micro coil - Angio-Seal closure at RIGHT groin. Palpable RLE pulses at the end of the Case IMPRESSION: Successful super-selective coil embolization of a target vessel at site of ooze from IMA territory, LEFT colic branch vessel, as above. PLAN: - The patient is to remain flat for 2 hours with RIGHT leg straight. - The patient may continue to experience several additional bloody bowel movements however should clear the rectal vault in the coming days. Thom Hall, MD [facility] [facility] Electronically Signed   By: Thom Hall M.D.   On: 12/28/2023 11:15   IR Angiogram Selective Each Additional Vessel Result Date: 12/28/2023 INDICATION: Diverticular  bleed.  Bright red blood per rectum. EXAM: Title; MESENTERIC ARTERIOGRAPHY and COIL EMBOLIZATION for LOWER GI BLEED Listed procedures; 1. ULTRASOUND GUIDANCE for RIGHT COMMON FEMORAL ARTERIAL ACCESS 2. INFERIOR MESENTERIC ARTERY ARTERIOGRAPHY 3. COIL EMBOLIZATION of a DISTAL LEFT COLIC BRANCH VESSEL at iDESCENDING COLON COMPARISON:  CTA AP, 12/27/2023 and 04/19/2023 MEDICATIONS: None ANESTHESIA/SEDATION: Moderate (conscious) sedation was employed during this procedure. A total of Versed  4 mg and Fentanyl  100 mcg was administered intravenously. Moderate Sedation Time: 60 minutes. The patient's level of consciousness and vital signs were monitored continuously by radiology nursing throughout the procedure under my direct supervision. CONTRAST:  45 mL Omnipaque  300 FLUOROSCOPY: Radiation Exposure Index and estimated peak skin dose (PSD); Reference air kerma (RAK), 211 mGy. COMPLICATIONS: None immediate. PROCEDURE: Informed consent was obtained from the patient and/or patient's representative following explanation of  the procedure, risks, benefits and alternatives. All questions were addressed. A time out was performed prior to the initiation of the procedure. Maximal barrier sterile technique utilized including caps, mask, sterile gowns, sterile gloves, large sterile drape, hand hygiene, and Betadine prep. The RIGHT femoral head was marked fluoroscopically. Under sterile conditions and local anesthesia, the RIGHT common femoral artery access was performed with a micropuncture needle. Under direct ultrasound guidance, the RIGHT common femoral was accessed with a micropuncture kit. An ultrasound image was saved for documentation purposes. This allowed for placement of a 5 Fr vascular sheath. A limited arteriogram was performed through the side arm of the sheath confirming appropriate access within the RIGHT common femoral artery. Over a Bentson wire, a Mickelson catheter was advanced, reformed, back bled and flushed. The catheter was then utilized to select the inferior mesenteric artery and a selective inferior mesenteric arteriogram was performed. Using a 2.1 Fr Progreat alpha microcatheter and microwire, access into the distal LEFT colic artery branch vessels was performed and selective arteriography was obtained to identify the bleeding vessel. Super selective coil embolization was then performed using a single non detachable micro coil. A post embolization arteriogram was performed without evidence of residual extravasation. Images were reviewed and the procedure was terminated. All wires, catheters and sheaths were removed from the patient. Hemostasis was achieved at the RIGHT groin access site with Angio-Seal closure device. The patient tolerated the procedure well without immediate post procedural complication. FINDINGS: - access via the RIGHT femoral artery. - No overt extravasation at the descending colon with suspected ooze from IMA territory, distal LEFT colic branch vessel - Successful embolization of the target  vessel with a single micro coil - Angio-Seal closure at RIGHT groin. Palpable RLE pulses at the end of the Case IMPRESSION: Successful super-selective coil embolization of a target vessel at site of ooze from IMA territory, LEFT colic branch vessel, as above. PLAN: - The patient is to remain flat for 2 hours with RIGHT leg straight. - The patient may continue to experience several additional bloody bowel movements however should clear the rectal vault in the coming days. Thom Hall, MD [facility] Electronically Signed   By: Thom Hall M.D.   On: 12/28/2023 11:15   IR US  Guide Vasc Access Right Result Date: 12/28/2023 INDICATION: Diverticular bleed.  Bright red blood per rectum. EXAM: Title; MESENTERIC ARTERIOGRAPHY and COIL EMBOLIZATION for LOWER GI BLEED Listed procedures; 1. ULTRASOUND GUIDANCE for RIGHT COMMON FEMORAL ARTERIAL ACCESS 2. INFERIOR MESENTERIC ARTERY ARTERIOGRAPHY 3. COIL EMBOLIZATION of a DISTAL LEFT COLIC BRANCH VESSEL at iDESCENDING COLON COMPARISON:  CTA AP, 12/27/2023 and 04/19/2023 MEDICATIONS:  None ANESTHESIA/SEDATION: Moderate (conscious) sedation was employed during this procedure. A total of Versed  4 mg and Fentanyl  100 mcg was administered intravenously. Moderate Sedation Time: 60 minutes. The patient's level of consciousness and vital signs were monitored continuously by radiology nursing throughout the procedure under my direct supervision. CONTRAST:  45 mL Omnipaque  300 FLUOROSCOPY: Radiation Exposure Index and estimated peak skin dose (PSD); Reference air kerma (RAK), 211 mGy. COMPLICATIONS: None immediate. PROCEDURE: Informed consent was obtained from the patient and/or patient's representative following explanation of the procedure, risks, benefits and alternatives. All questions were addressed. A time out was performed prior to the initiation of the procedure. Maximal barrier sterile technique utilized including caps,  mask, sterile gowns, sterile gloves, large sterile drape, hand hygiene, and Betadine prep. The RIGHT femoral head was marked fluoroscopically. Under sterile conditions and local anesthesia, the RIGHT common femoral artery access was performed with a micropuncture needle. Under direct ultrasound guidance, the RIGHT common femoral was accessed with a micropuncture kit. An ultrasound image was saved for documentation purposes. This allowed for placement of a 5 Fr vascular sheath. A limited arteriogram was performed through the side arm of the sheath confirming appropriate access within the RIGHT common femoral artery. Over a Bentson wire, a Mickelson catheter was advanced, reformed, back bled and flushed. The catheter was then utilized to select the inferior mesenteric artery and a selective inferior mesenteric arteriogram was performed. Using a 2.1 Fr Progreat alpha microcatheter and microwire, access into the distal LEFT colic artery branch vessels was performed and selective arteriography was obtained to identify the bleeding vessel. Super selective coil embolization was then performed using a single non detachable micro coil. A post embolization arteriogram was performed without evidence of residual extravasation. Images were reviewed and the procedure was terminated. All wires, catheters and sheaths were removed from the patient. Hemostasis was achieved at the RIGHT groin access site with Angio-Seal closure device. The patient tolerated the procedure well without immediate post procedural complication. FINDINGS: - access via the RIGHT femoral artery. - No overt extravasation at the descending colon with suspected ooze from IMA territory, distal LEFT colic branch vessel - Successful embolization of the target vessel with a single micro coil - Angio-Seal closure at RIGHT groin. Palpable RLE pulses at the end of the Case IMPRESSION: Successful super-selective coil embolization of a target vessel at site of ooze from  IMA territory, LEFT colic branch vessel, as above. PLAN: - The patient is to remain flat for 2 hours with RIGHT leg straight. - The patient may continue to experience several additional bloody bowel movements however should clear the rectal vault in the coming days. Thom Hall, MD [facility] Radiology Electronically Signed   By: Thom Hall M.D.   On: 12/28/2023 11:15   CT ANGIO GI BLEED Result Date: 12/27/2023 CLINICAL DATA:  Rectal bleeding.  Left upper abdominal pain. EXAM: CTA ABDOMEN AND PELVIS WITHOUT AND WITH CONTRAST TECHNIQUE: Multidetector CT imaging of the abdomen and pelvis was performed using the standard protocol during bolus administration of intravenous contrast. Multiplanar reconstructed images and MIPs were obtained and reviewed to evaluate the vascular anatomy. RADIATION DOSE REDUCTION: This exam was performed according to the departmental dose-optimization program which includes automated exposure control, adjustment of the mA and/or kV according to patient size and/or use of iterative reconstruction technique. CONTRAST:  OMNIPAQUE  IOHEXOL  350 MG/ML SOLN COMPARISON:  None Available. FINDINGS: VASCULAR Aorta: Atherosclerotic disease in the abdominal aorta without aneurysm or dissection or  significant stenosis. Celiac: Patent without evidence of aneurysm, dissection, vasculitis or significant stenosis. SMA: Patent without evidence of aneurysm, dissection, vasculitis or significant stenosis. Renals: Single bilateral renal arteries. Left renal artery is widely patent without aneurysm, dissection or stenosis. At least mild stenosis at origin of the right renal artery without aneurysm or dissection. IMA: Patent without evidence of aneurysm, dissection, vasculitis or significant stenosis. 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 Inflow: Patent without evidence of aneurysm,  dissection, vasculitis or significant stenosis. Proximal Outflow: Proximal femoral arteries are patent bilaterally. Veins: Portal venous system is patent. Normal appearance of the IVC and renal veins. Normal appearance of the iliac veins. Review of the MIP images confirms the above findings. NON-VASCULAR Lower chest: Peripheral reticular densities at the lung bases are suggestive for chronic changes. No pleural effusions. Hepatobiliary: Gallbladder is mildly distended with high-density sludge or small stones. No gallbladder inflammation. Main portal venous system is patent. No biliary dilatation. Pancreas: Unremarkable. No pancreatic ductal dilatation or surrounding inflammatory changes. Spleen: Normal in size without focal abnormality. Adrenals/Urinary Tract: Normal adrenal glands. No kidney stones. Cyst in the left kidney upper pole. No hydronephrosis. No suspicious renal lesion. Normal urinary bladder. Stomach/Bowel: Positive for active GI bleeding involving the descending colon. 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 The area of arterial bleeding in the colon is distal to the previously embolized area. Extensive diverticular disease throughout the colon. Endoscopic clip in the right colon near the ileocecal valve. Normal appendix. Normal stomach. No bowel dilatation or obstruction. Lymphatic: No lymph node enlargement in the abdomen or pelvis. Reproductive: Uterus and bilateral adnexa are unremarkable. Other: Negative for ascites.  Negative for free air. Musculoskeletal: No acute bone abnormality. Old vertebral body compression fracture at L1. IMPRESSION: 1. Positive for active GI bleeding involving the descending colon. Findings are most compatible with a diverticular bleed. 2. 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 3. Extensive diverticular disease throughout the colon. 4. Gallbladder sludge or  small stones. No gallbladder inflammation. 5. Aortic Atherosclerosis (ICD10-I70.0). These results were called by telephone at the time of interpretation on 12/27/2023 at 5:12 pm to provider Dr. Efrain, who verbally acknowledged these results. Electronically Signed   By: Juliene Balder M.D.   On: 12/27/2023 20:33    Assessment/Plan 1. Acute cystitis without hematuria (Primary) - 07/08 UA/culture at [facility] due to frequency - 07/10 urine culture > 100,000 cfu/mL E coli - continues to have frequency and urgency - start cipro  250 mg po BID x 3 days   2. Blood loss anemia - hgb 8.5 12/31/2023 - cont ferrous gluconate     Family/ staff Communication: plan discussed with patient and nurse  Labs/tests ordered:  none

## 2024-01-16 ENCOUNTER — Encounter (HOSPITAL_COMMUNITY): Payer: Self-pay | Admitting: Interventional Radiology

## 2024-01-20 DIAGNOSIS — F4001 Agoraphobia with panic disorder: Secondary | ICD-10-CM | POA: Diagnosis not present

## 2024-01-20 DIAGNOSIS — F330 Major depressive disorder, recurrent, mild: Secondary | ICD-10-CM | POA: Diagnosis not present

## 2024-01-21 DIAGNOSIS — R41841 Cognitive communication deficit: Secondary | ICD-10-CM | POA: Diagnosis not present

## 2024-01-22 DIAGNOSIS — R41841 Cognitive communication deficit: Secondary | ICD-10-CM | POA: Diagnosis not present

## 2024-01-26 DIAGNOSIS — R41841 Cognitive communication deficit: Secondary | ICD-10-CM | POA: Diagnosis not present

## 2024-01-27 DIAGNOSIS — M6281 Muscle weakness (generalized): Secondary | ICD-10-CM | POA: Diagnosis not present

## 2024-01-27 DIAGNOSIS — R29898 Other symptoms and signs involving the musculoskeletal system: Secondary | ICD-10-CM | POA: Diagnosis not present

## 2024-01-27 DIAGNOSIS — K9289 Other specified diseases of the digestive system: Secondary | ICD-10-CM | POA: Diagnosis not present

## 2024-01-28 DIAGNOSIS — R41841 Cognitive communication deficit: Secondary | ICD-10-CM | POA: Diagnosis not present

## 2024-02-02 DIAGNOSIS — R41841 Cognitive communication deficit: Secondary | ICD-10-CM | POA: Diagnosis not present

## 2024-02-04 DIAGNOSIS — K922 Gastrointestinal hemorrhage, unspecified: Secondary | ICD-10-CM | POA: Diagnosis not present

## 2024-02-04 DIAGNOSIS — K921 Melena: Secondary | ICD-10-CM | POA: Diagnosis not present

## 2024-02-04 DIAGNOSIS — D126 Benign neoplasm of colon, unspecified: Secondary | ICD-10-CM | POA: Diagnosis not present

## 2024-02-04 DIAGNOSIS — K5900 Constipation, unspecified: Secondary | ICD-10-CM | POA: Diagnosis not present

## 2024-02-04 DIAGNOSIS — R41841 Cognitive communication deficit: Secondary | ICD-10-CM | POA: Diagnosis not present

## 2024-02-09 DIAGNOSIS — R41841 Cognitive communication deficit: Secondary | ICD-10-CM | POA: Diagnosis not present

## 2024-02-11 DIAGNOSIS — R41841 Cognitive communication deficit: Secondary | ICD-10-CM | POA: Diagnosis not present

## 2024-02-12 ENCOUNTER — Encounter: Payer: Self-pay | Admitting: Internal Medicine

## 2024-02-12 ENCOUNTER — Non-Acute Institutional Stay (SKILLED_NURSING_FACILITY): Payer: Self-pay | Admitting: Internal Medicine

## 2024-02-12 DIAGNOSIS — F339 Major depressive disorder, recurrent, unspecified: Secondary | ICD-10-CM

## 2024-02-12 DIAGNOSIS — G2581 Restless legs syndrome: Secondary | ICD-10-CM | POA: Diagnosis not present

## 2024-02-12 DIAGNOSIS — Z8719 Personal history of other diseases of the digestive system: Secondary | ICD-10-CM | POA: Diagnosis not present

## 2024-02-12 DIAGNOSIS — R600 Localized edema: Secondary | ICD-10-CM

## 2024-02-12 DIAGNOSIS — E785 Hyperlipidemia, unspecified: Secondary | ICD-10-CM

## 2024-02-12 DIAGNOSIS — M25512 Pain in left shoulder: Secondary | ICD-10-CM

## 2024-02-12 DIAGNOSIS — M25511 Pain in right shoulder: Secondary | ICD-10-CM

## 2024-02-12 DIAGNOSIS — D509 Iron deficiency anemia, unspecified: Secondary | ICD-10-CM

## 2024-02-12 DIAGNOSIS — G8929 Other chronic pain: Secondary | ICD-10-CM

## 2024-02-12 DIAGNOSIS — J449 Chronic obstructive pulmonary disease, unspecified: Secondary | ICD-10-CM

## 2024-02-12 DIAGNOSIS — R32 Unspecified urinary incontinence: Secondary | ICD-10-CM

## 2024-02-13 NOTE — Progress Notes (Signed)
 Location:  [facility] Room Number: AL20-A Place of Service:  SNF (31)  Provider:   Code Status: DNR Goals of Care:     01/14/2024   10:21 AM  Advanced Directives  Does Patient Have a Medical Advance Directive? Yes  Type of Advance Directive Out of facility DNR (pink MOST or yellow form)  Does patient want to make changes to medical advance directive? No - Patient declined     Chief Complaint  Patient presents with   Medical Management of Chronic Issues    HPI: Patient is a 83 y.o. female seen today for medical management of chronic diseases.   [place]ives [place] in [facility]   Patient has a history of recurrent GI bleed with multiple workup, COPD on nocturnal oxygen she is former smoker, history of pulmonary nodules, hypertension, osteoporosis, urge incontinence   Recurent GI bleed Was seen recently by GI They recommended further work up at [facility] She has refused for that Has Had Multiple Admission in hospital due to GI bleed Last Admission successful IR embolization for extravasation in the descending colon   Has not had any bleeding since then She did have diarrhea recently  Told the Nurse to check for Blood and it was negative for any Gross blood  Right Shoulder pain Did not see Ortho Manages with Tramadol and Tylenol Glaucoma Vision is getting worse Able to do her ADLS in [place]  Has LE edema controlled with Diuretics Dopplers Negative  She did not have any other complains Little stress as her husband is going to eventually try to move to [facility] Readings from Last 3 Encounters:  02/12/24 169 lb 12.8 oz (77 kg)  01/14/24 163 lb 6.4 oz (74.1 kg)  01/08/24 157 lb 1.6 oz (71.3 kg)     Past Medical History:  Diagnosis Date   Anxiety    Arthritis    bilateral knees, shoulders, elbows; neck, pretty widespread (09/05/2017)   BPPV (benign paroxysmal positional vertigo)    Depression    GERD (gastroesophageal reflux disease)    Glaucoma, both eyes     Headache    probably 2/month (09/05/2017)   History of blood transfusion ~ 2008   related to LGIB   Hyperlipemia    Lower GI bleeding ~ 2008; 09/05/2017   had to have blood transfusion   Macular degeneration, bilateral    Osteopenia    Seasonal allergies    Skin cancer, 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, left side   Sleeping excessive    Tinnitus of both ears     Past Surgical History:  Procedure Laterality Date   BALLOON DILATION N/A 05/08/2021   Procedure: BALLOON DILATION;  Surgeon: Saintclair Jasper, MD;  Location: [facility] ENDOSCOPY;  Service: Gastroenterology;  Laterality: N/A;   BASAL CELL CARCINOMA EXCISION  2001   off my nose, left side   BIOPSY  05/08/2021   Procedure: BIOPSY;  Surgeon: Saintclair Jasper, MD;  Location: [facility] ENDOSCOPY;  Service: Gastroenterology;;   BIOPSY  04/25/2023   Procedure: BIOPSY;  Surgeon: Dianna Specking, MD;  Location: [facility] ENDOSCOPY;  Service: Gastroenterology;;   BLEPHAROPLASTY Bilateral    BONE BIOPSY  10/26/2023   Procedure: BIOPSY, GI;  Surgeon: Elicia Claw, MD;  Location: [facility] ENDOSCOPY;  Service: Gastroenterology;;   CATARACT EXTRACTION W/ INTRAOCULAR LENS  IMPLANT, BILATERAL Bilateral 1990's   COLONOSCOPY N/A 10/26/2023   Procedure: COLONOSCOPY;  Surgeon: Elicia Claw, MD;  Location: [facility] ENDOSCOPY;  Service: Gastroenterology;  Laterality:  N/A;   COLONOSCOPY WITH PROPOFOL N/A 05/04/2022   Procedure: COLONOSCOPY WITH PROPOFOL;  Surgeon: Saintclair Jasper, MD;  Location: [facility] ENDOSCOPY;  Service: Gastroenterology;  Laterality: N/A;   COLONOSCOPY WITH PROPOFOL N/A 04/25/2023   Procedure: COLONOSCOPY WITH PROPOFOL;  Surgeon: Dianna Specking, MD;  Location: [facility] ENDOSCOPY;  Service: Gastroenterology;  Laterality: N/A;   ENTEROSCOPY N/A 11/20/2023   Procedure: ENTEROSCOPY;  Surgeon: Dianna Specking, MD;  Location: [facility] ENDOSCOPY;  Service: Gastroenterology;  Laterality: N/A;   ESOPHAGOGASTRODUODENOSCOPY N/A 10/26/2023   Procedure: EGD  (ESOPHAGOGASTRODUODENOSCOPY);  Surgeon: Elicia Claw, MD;  Location: [facility] ENDOSCOPY;  Service: Gastroenterology;  Laterality: N/A;   ESOPHAGOGASTRODUODENOSCOPY N/A 11/20/2023   Procedure: EGD (ESOPHAGOGASTRODUODENOSCOPY);  Surgeon: Dianna Specking, MD;  Location: [facility] ENDOSCOPY;  Service: Gastroenterology;  Laterality: N/A;   ESOPHAGOGASTRODUODENOSCOPY (EGD) WITH PROPOFOL N/A 05/08/2021   Procedure: ESOPHAGOGASTRODUODENOSCOPY (EGD) WITH PROPOFOL;  Surgeon: Saintclair Jasper, MD;  Location: [facility] ENDOSCOPY;  Service: Gastroenterology;  Laterality: N/A;   ESOPHAGOGASTRODUODENOSCOPY (EGD) WITH PROPOFOL N/A 01/11/2022   Procedure: ESOPHAGOGASTRODUODENOSCOPY (EGD) WITH PROPOFOL;  Surgeon: Rosalie Kitchens, MD;  Location: [facility] ENDOSCOPY;  Service: Gastroenterology;  Laterality: N/A;   ESOPHAGOGASTRODUODENOSCOPY (EGD) WITH PROPOFOL N/A 01/06/2023   Procedure: ESOPHAGOGASTRODUODENOSCOPY (EGD) WITH PROPOFOL;  Surgeon: Rosalie Kitchens, MD;  Location: [facility] ENDOSCOPY;  Service: Gastroenterology;  Laterality: N/A;   ESOPHAGOGASTRODUODENOSCOPY (EGD) WITH PROPOFOL N/A 04/21/2023   Procedure: ESOPHAGOGASTRODUODENOSCOPY (EGD) WITH PROPOFOL;  Surgeon: Dianna Specking, MD;  Location: [facility] ENDOSCOPY;  Service: Gastroenterology;  Laterality: N/A;   EYE SURGERY Bilateral    to improve vision after cataract OR   GIVENS CAPSULE STUDY N/A 04/21/2023   Procedure: GIVENS CAPSULE STUDY;  Surgeon: Dianna Specking, MD;  Location: [facility] ENDOSCOPY;  Service: Gastroenterology;  Laterality: N/A;   GIVENS CAPSULE STUDY N/A 11/18/2023   Procedure: IMAGING PROCEDURE, GI TRACT, INTRALUMINAL, VIA CAPSULE;  Surgeon: Dianna Specking, MD;  Location: [facility] ENDOSCOPY;  Service: Gastroenterology;  Laterality: N/A;   HEMOSTASIS CLIP PLACEMENT  10/26/2023   Procedure: CONTROL OF HEMORRHAGE, GI TRACT, ENDOSCOPIC, BY CLIPPING OR OVERSEWING;  Surgeon: Elicia Claw, MD;  Location: [facility] ENDOSCOPY;  Service: Gastroenterology;;   IR ANGIOGRAM FOLLOW UP STUDY  12/19/2020    IR ANGIOGRAM SELECTIVE EACH ADDITIONAL VESSEL  12/19/2020   IR ANGIOGRAM SELECTIVE EACH ADDITIONAL VESSEL  12/19/2020   IR ANGIOGRAM SELECTIVE EACH ADDITIONAL VESSEL  12/28/2023   IR ANGIOGRAM VISCERAL SELECTIVE  12/19/2020   IR ANGIOGRAM VISCERAL SELECTIVE  12/28/2023   IR EMBO ART  VEN HEMORR LYMPH EXTRAV  INC GUIDE ROADMAPPING  12/19/2020   IR EMBO ART  VEN HEMORR LYMPH EXTRAV  INC GUIDE ROADMAPPING  12/28/2023   IR US  GUIDE VASC ACCESS RIGHT  12/19/2020   IR US  GUIDE VASC ACCESS RIGHT  12/28/2023   JOINT REPLACEMENT     POLYPECTOMY  05/04/2022   Procedure: POLYPECTOMY;  Surgeon: Saintclair Jasper, MD;  Location: [facility] ENDOSCOPY;  Service: Gastroenterology;;   POLYPECTOMY  10/26/2023   Procedure: POLYPECTOMY, INTESTINE;  Surgeon: Elicia Claw, MD;  Location: [facility] ENDOSCOPY;  Service: Gastroenterology;;   STAPEDES SURGERY Left    scraped stapedes because it was sticking when it wasn't suppose to   TONSILLECTOMY AND ADENOIDECTOMY  1946   TOTAL KNEE ARTHROPLASTY Left ~ 2008   TOTAL KNEE ARTHROPLASTY  12/20/2011   Procedure: TOTAL KNEE ARTHROPLASTY;  Surgeon: Elspeth JONELLE Her, MD;  Location: [facility] OR;  Service: Orthopedics;  Laterality: Right;  Right Total Knee Arthroplasty   TUBAL LIGATION  1980's    Allergies  Allergen Reactions   Morphine And  Codeine Itching   Aspirin Other (See Comments)    Unknown reaction   Duloxetine Hcl Other (See Comments)    Unknown reaction   Tymlos [Abaloparatide] Other (See Comments)    Unknown reaction   Ventolin [Albuterol] Other (See Comments)    rapid heart beat   Cefadroxil Hives    Patient can take amoxicillin and cipro    Outpatient Encounter Medications as of 02/12/2024  Medication Sig   alendronate (FOSAMAX) 70 MG tablet Take 70 mg by mouth every Sunday. Take with a full glass of water on an empty stomach.   Dorzolamide HCl-Timolol Mal PF 2-0.5 % SOLN Place 1 drop into both eyes in the morning and at bedtime.   ferrous gluconate (FERGON) 240 (27 FE) MG  tablet Take 240 mg by mouth every morning. Take without food   latanoprost (XALATAN) 0.005 % ophthalmic solution Place 1 drop into both eyes at bedtime.   Multiple Vitamins-Minerals (PRESERVISION AREDS 2) CAPS Take 1 capsule by mouth 2 (two) times daily.   Netarsudil Dimesylate (RHOPRESSA) 0.02 % SOLN Place 1 drop into both eyes at bedtime.   oxybutynin (DITROPAN-XL) 10 MG 24 hr tablet Take 10 mg by mouth at bedtime.   pantoprazole (PROTONIX) 40 MG tablet Take 1 tablet (40 mg total) by mouth 2 (two) times daily before a meal.   potassium chloride SA (KLOR-CON M) 20 MEQ tablet Take 20 mEq by mouth daily.   Probiotic Product (ALIGN) 4 MG CAPS Take 4 mg by mouth at bedtime.   senna (SENOKOT) 8.6 MG TABS tablet Take 2 tablets by mouth at bedtime.   sertraline (ZOLOFT) 50 MG tablet Take 1.5 tablets (75 mg total) by mouth daily.   umeclidinium-vilanterol (ANORO ELLIPTA) 62.5-25 MCG/ACT AEPB Inhale 1 puff into the lungs every morning.   Acetaminophen (TYLENOL PO) Take 1,000 mg by mouth as needed.   acetaminophen (TYLENOL) 650 MG CR tablet Take 1,300 mg by mouth 2 (two) times daily.   albuterol (VENTOLIN HFA) 108 (90 Base) MCG/ACT inhaler Inhale 2 puffs into the lungs every 4 (four) hours as needed for wheezing or shortness of breath.   Ascorbic Acid (VITAMIN C) 1000 MG tablet Take 1,000 mg by mouth every morning.   cetirizine (ZYRTEC) 5 MG chewable tablet Chew 5 mg by mouth daily as needed for allergies.   Cholecalciferol (VITAMIN D3) 50 MCG (2000 UT) TABS Take 2,000 Units by mouth every morning.   ciprofloxacin (CIPRO) 250 MG tablet Take 250 mg by mouth 2 (two) times daily.   cyanocobalamin (VITAMIN B12) 1000 MCG tablet Take 1,000 mcg by mouth daily.   fluticasone (FLONASE) 50 MCG/ACT nasal spray Place 1 spray into both nostrils as needed for allergies or rhinitis.   guaifenesin (ROBITUSSIN) 100 MG/5ML syrup Take 10 mLs by mouth every 4 (four) hours as needed for cough.   loperamide (IMODIUM A-D) 2 MG  tablet Take 4 mg by mouth 4 (four) times daily as needed for diarrhea or loose stools.   Polyethylene Glycol 3350 (MIRALAX PO) Take 8.6 g by mouth daily as needed.   potassium chloride 20 MEQ/15ML (10%) SOLN Take 15 mLs by mouth daily. (Patient not taking: Reported on 02/12/2024)   rOPINIRole (REQUIP) 0.5 MG tablet Take 1 tablet (0.5 mg total) by mouth daily as needed.   simvastatin (ZOCOR) 20 MG tablet Take 20 mg by mouth every evening.   Sodium Fluoride (PREVIDENT 5000 BOOSTER PLUS) 1.1 % PSTE Place 1 Application onto teeth See admin instructions. Brush on teeth with  a toothbrush after evening mouth care. Spit out excess and do not rinse.   torsemide (DEMADEX) 20 MG tablet Take 20 mg by mouth every morning.   traMADol HCl 25 MG TABS Take 25 mg by mouth 2 (two) times daily.   zinc oxide 20 % ointment Apply 1 Application topically as needed for irritation.   No facility-administered encounter medications on file as of 02/12/2024.    Review of Systems:  Review of Systems  Constitutional:  Negative for activity change and appetite change.  HENT: Negative.    Eyes:  Positive for visual disturbance.  Respiratory:  Negative for cough and shortness of breath.   Cardiovascular:  Negative for leg swelling.  Gastrointestinal:  Positive for diarrhea. Negative for constipation.  Genitourinary: Negative.   Musculoskeletal:  Positive for gait problem. Negative for arthralgias and myalgias.  Skin: Negative.   Neurological:  Negative for dizziness and weakness.  Psychiatric/Behavioral:  Negative for confusion, dysphoric mood and sleep disturbance. The patient is nervous/anxious.     Health Maintenance  Topic Date Due   DEXA SCAN  Never done   COVID-19 Vaccine (6 - 2024-25 season) 03/02/2023   INFLUENZA VACCINE  01/30/2024   Medicare Annual Wellness (AWV)  04/03/2024   DTaP/Tdap/Td (6 - Td or Tdap) 03/12/2028   Pneumococcal Vaccine: 50+ Years  Completed   Zoster Vaccines- Shingrix  Completed   HPV  VACCINES  Aged Out   Meningococcal B Vaccine  Aged Out    Physical Exam: Vitals:   02/12/24 1425  BP: 101/62  Pulse: 86  Temp: (!) 97.3 F (36.3 C)  SpO2: 96%  Weight: 169 lb 12.8 oz (77 kg)  Height: 5' 1 (1.549 m)   Body mass index is 32.08 kg/m. Physical Exam Vitals reviewed.  Constitutional:      Appearance: Normal appearance.  HENT:     Head: Normocephalic.     Nose: Nose normal.     Mouth/Throat:     Mouth: Mucous membranes are moist.     Pharynx: Oropharynx is clear.  Eyes:     Pupils: Pupils are equal, round, and reactive to light.  Cardiovascular:     Rate and Rhythm: Normal rate and regular rhythm.     Pulses: Normal pulses.     Heart sounds: Normal heart sounds. No murmur heard. Pulmonary:     Effort: Pulmonary effort is normal.     Breath sounds: Normal breath sounds.  Abdominal:     General: Abdomen is flat. Bowel sounds are normal.     Palpations: Abdomen is soft.  Musculoskeletal:        General: Swelling present.     Cervical back: Neck supple.  Skin:    General: Skin is warm.  Neurological:     General: No focal deficit present.     Mental Status: She is alert and oriented to person, place, and time.  Psychiatric:        Mood and Affect: Mood normal.        Thought Content: Thought content normal.     Labs reviewed: Basic Metabolic Panel: Recent Labs    11/18/23 1025 11/19/23 0302 11/19/23 0904 11/20/23 0254 11/21/23 0310 12/28/23 0411 12/29/23 0247 12/30/23 0324 12/31/23 0533  NA 138   < >  --  141   < > 140 139 139 140  K 3.0*   < >  --  3.8   < > 3.4* 3.6 3.4* 3.7  CL 105   < >  --  112*   < > 107 109 109 111  CO2 25   < >  --  23   < > 25 23 23 24   GLUCOSE 148*   < >  --  130*   < > 119* 99 122* 118*  BUN 25*   < >  --  13   < > 10 9 7* <5*  CREATININE 0.57   < >  --  <0.30*   < > 0.45 0.36* 0.40* 0.34*  CALCIUM 7.4*   < >  --  7.9*   < > 7.7* 8.2* 8.1* 8.4*  MG 1.9  --   --  2.0   < > 1.9 2.5* 2.3  --   PHOS 2.6  --   2.2* 1.8*  --   --   --   --   --    < > = values in this interval not displayed.   Liver Function Tests: Recent Labs    11/19/23 0302 12/27/23 1130 12/28/23 0411  AST 14* 23 20  ALT 8 8 9   ALKPHOS 29* 65 49  BILITOT 0.8 0.8 1.9*  PROT 4.5* 6.1* 4.9*  ALBUMIN 2.6* 3.5 2.9*   No results for input(s): LIPASE, AMYLASE in the last 8760 hours. No results for input(s): AMMONIA in the last 8760 hours. CBC: Recent Labs    12/18/23 0000 12/27/23 1130 12/27/23 1139 12/28/23 0411 12/28/23 0843 12/29/23 1723 12/30/23 0324 12/31/23 0533  WBC 4.3 5.8  --  8.5   < > 6.7 4.8 4.5  NEUTROABS 2,137.00 4.0  --  7.1  --   --   --   --   HGB 11.0* 9.4*   < > 8.2*   < > 7.6* 7.1* 8.5*  HCT 35* 31.2*   < > 26.7*   < > 24.8* 23.8* 27.9*  MCV  --  99.7  --  97.4   < > 98.8 101.3* 98.2  PLT 184 170  --  121*   < > 145* 135* 160   < > = values in this interval not displayed.   Lipid Panel: Recent Labs    07/14/23 0000  CHOL 125  HDL 51  LDLCALC 53  TRIG 130   No results found for: HGBA1C  Procedures since last visit: No results found.  Assessment/Plan 1. Chronic obstructive pulmonary disease, unspecified COPD type (HCC) (Primary) On Anoro   2. Restless leg syndrome Requip  3. Urinary incontinence, unspecified type Uses Ditropan  4. History of GI bleed Right now controlled Some   5. Hyperlipidemia, unspecified hyperlipidemia type On statin  6. Bilateral leg edema Torsemide QOD  7. Chronic pain of both shoulders Refuses to see Ortho Now on Tramadol  8. Recurrent depression (HCC) Zoloft  9. Iron deficiency anemia, unspecified iron deficiency anemia type Due to GI bleed On Iron    Labs/tests ordered:  * No order type specified * Next appt:  Visit date not found

## 2024-02-17 DIAGNOSIS — F4001 Agoraphobia with panic disorder: Secondary | ICD-10-CM | POA: Diagnosis not present

## 2024-02-17 DIAGNOSIS — F330 Major depressive disorder, recurrent, mild: Secondary | ICD-10-CM | POA: Diagnosis not present

## 2024-02-18 DIAGNOSIS — R41841 Cognitive communication deficit: Secondary | ICD-10-CM | POA: Diagnosis not present

## 2024-02-19 DIAGNOSIS — R41841 Cognitive communication deficit: Secondary | ICD-10-CM | POA: Diagnosis not present

## 2024-02-20 DIAGNOSIS — R41841 Cognitive communication deficit: Secondary | ICD-10-CM | POA: Diagnosis not present

## 2024-02-23 DIAGNOSIS — R41841 Cognitive communication deficit: Secondary | ICD-10-CM | POA: Diagnosis not present

## 2024-02-25 DIAGNOSIS — H401133 Primary open-angle glaucoma, bilateral, severe stage: Secondary | ICD-10-CM | POA: Diagnosis not present

## 2024-02-25 DIAGNOSIS — R41841 Cognitive communication deficit: Secondary | ICD-10-CM | POA: Diagnosis not present

## 2024-02-25 DIAGNOSIS — Z961 Presence of intraocular lens: Secondary | ICD-10-CM | POA: Diagnosis not present

## 2024-02-25 DIAGNOSIS — H353131 Nonexudative age-related macular degeneration, bilateral, early dry stage: Secondary | ICD-10-CM | POA: Diagnosis not present

## 2024-03-02 ENCOUNTER — Other Ambulatory Visit: Payer: Self-pay | Admitting: Adult Health

## 2024-03-02 DIAGNOSIS — R41841 Cognitive communication deficit: Secondary | ICD-10-CM | POA: Diagnosis not present

## 2024-03-02 DIAGNOSIS — K9289 Other specified diseases of the digestive system: Secondary | ICD-10-CM | POA: Diagnosis not present

## 2024-03-02 DIAGNOSIS — R29898 Other symptoms and signs involving the musculoskeletal system: Secondary | ICD-10-CM | POA: Diagnosis not present

## 2024-03-02 DIAGNOSIS — M150 Primary generalized (osteo)arthritis: Secondary | ICD-10-CM

## 2024-03-02 DIAGNOSIS — M6281 Muscle weakness (generalized): Secondary | ICD-10-CM | POA: Diagnosis not present

## 2024-03-02 MED ORDER — TRAMADOL HCL 25 MG PO TABS: 25.0000 mg | ORAL_TABLET | Freq: Two times a day (BID) | ORAL | 0 refills | Status: DC

## 2024-03-04 ENCOUNTER — Non-Acute Institutional Stay (SKILLED_NURSING_FACILITY): Payer: Self-pay | Admitting: Orthopedic Surgery

## 2024-03-04 ENCOUNTER — Encounter: Payer: Self-pay | Admitting: Orthopedic Surgery

## 2024-03-04 DIAGNOSIS — N390 Urinary tract infection, site not specified: Secondary | ICD-10-CM | POA: Diagnosis not present

## 2024-03-04 DIAGNOSIS — M6281 Muscle weakness (generalized): Secondary | ICD-10-CM | POA: Diagnosis not present

## 2024-03-04 DIAGNOSIS — N3281 Overactive bladder: Secondary | ICD-10-CM | POA: Diagnosis not present

## 2024-03-04 DIAGNOSIS — R600 Localized edema: Secondary | ICD-10-CM

## 2024-03-04 DIAGNOSIS — R41841 Cognitive communication deficit: Secondary | ICD-10-CM | POA: Diagnosis not present

## 2024-03-04 DIAGNOSIS — R350 Frequency of micturition: Secondary | ICD-10-CM

## 2024-03-04 DIAGNOSIS — K9289 Other specified diseases of the digestive system: Secondary | ICD-10-CM | POA: Diagnosis not present

## 2024-03-04 DIAGNOSIS — R29898 Other symptoms and signs involving the musculoskeletal system: Secondary | ICD-10-CM | POA: Diagnosis not present

## 2024-03-04 NOTE — Progress Notes (Signed)
 Location:  [facility] Room Number: AL20-A Place of Service:  SNF (31) Provider:  Gil Greig BRAVO, NP  Patient Care Team: Gil Greig BRAVO, NP as PCP - General (Adult Health Nurse Practitioner) Charlanne Fredia CROME, MD as Consulting Physician (Internal Medicine) Apickup-Ot, A, OT as Occupational Therapist (Occupational Therapy)  Extended Emergency Contact Information Primary Emergency Contact: Veazey,[name] Address: [address]          [place], [place] [address] [place] Home Phone: [phone] Mobile Phone: [phone] Relation: Spouse Secondary Emergency Contact: Saner MD,[name] Ozell Crane Phone: [phone] Relation: Son Interpreter needed? No  Code Status:  DNR Goals of care: Advanced Directive information    01/14/2024   10:21 AM  Advanced Directives  Does Patient Have a Medical Advance Directive? Yes  Type of Advance Directive Out of facility DNR (pink MOST or yellow form)  Does patient want to make changes to medical advance directive? No - Patient declined     Chief Complaint  Patient presents with   Urinary Frequency    HPI:  Pt is a 83 y.o. female seen today for acute visit due to urinary frequency.   She currently resides on the assisted living unit at [facility]. PMH: HTN, COPD, pulmonary nodule, Diverticulosis with GI bleed, GERD, OA, osteoporosis,macular degeneration, iron  def anemia, unstable gait, urinary incontinence, anxiety, and depression.   H/o urinary urgency and taking oxybutynin . Also on torsemide  lower leg edema. She reports increased urinary frequency and bladder pressure. Denies flank pain, fever. She does always drink fluids well. Also drinks caffeinated drinks. 07/10 urine culture > 100,000 cfu/mL E. Coli, resolved with Cipro  x 3 days.     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, left side   Sleeping excessive    Tinnitus of both ears    Past Surgical History:  Procedure Laterality Date   BALLOON DILATION N/A 05/08/2021   Procedure: BALLOON DILATION;  Surgeon: Saintclair Jasper, MD;  Location: [facility] ENDOSCOPY;  Service: Gastroenterology;  Laterality: N/A;   BASAL CELL CARCINOMA EXCISION  2001   off my nose, left side   BIOPSY  05/08/2021   Procedure: BIOPSY;  Surgeon: Saintclair Jasper, MD;  Location: [facility] ENDOSCOPY;  Service: Gastroenterology;;   BIOPSY  04/25/2023   Procedure: BIOPSY;  Surgeon: Dianna Specking, MD;  Location: [facility] ENDOSCOPY;  Service: Gastroenterology;;   BLEPHAROPLASTY Bilateral    BONE BIOPSY  10/26/2023   Procedure: BIOPSY, GI;  Surgeon: Elicia Claw, MD;  Location: [facility] ENDOSCOPY;  Service: Gastroenterology;;   CATARACT EXTRACTION W/ INTRAOCULAR LENS  IMPLANT, BILATERAL Bilateral 1990's   COLONOSCOPY N/A 10/26/2023   Procedure: COLONOSCOPY;  Surgeon: Elicia Claw, MD;  Location: [facility] ENDOSCOPY;  Service: Gastroenterology;  Laterality: N/A;   COLONOSCOPY WITH PROPOFOL  N/A 05/04/2022   Procedure: COLONOSCOPY WITH PROPOFOL ;  Surgeon: Saintclair Jasper, MD;  Location: [facility]  ENDOSCOPY;  Service: Gastroenterology;  Laterality: N/A;   COLONOSCOPY WITH PROPOFOL  N/A 04/25/2023   Procedure: COLONOSCOPY WITH PROPOFOL ;  Surgeon: Dianna Specking, MD;  Location: [facility] ENDOSCOPY;  Service: Gastroenterology;  Laterality: N/A;   ENTEROSCOPY N/A 11/20/2023   Procedure: ENTEROSCOPY;  Surgeon: Dianna Specking, MD;  Location: [facility] ENDOSCOPY;  Service: Gastroenterology;   Laterality: N/A;   ESOPHAGOGASTRODUODENOSCOPY N/A 10/26/2023   Procedure: EGD (ESOPHAGOGASTRODUODENOSCOPY);  Surgeon: Elicia Claw, MD;  Location: [facility] ENDOSCOPY;  Service: Gastroenterology;  Laterality: N/A;   ESOPHAGOGASTRODUODENOSCOPY N/A 11/20/2023   Procedure: EGD (ESOPHAGOGASTRODUODENOSCOPY);  Surgeon: Dianna Specking, MD;  Location: [facility] ENDOSCOPY;  Service: Gastroenterology;  Laterality: N/A;   ESOPHAGOGASTRODUODENOSCOPY (EGD) WITH PROPOFOL  N/A 05/08/2021   Procedure: ESOPHAGOGASTRODUODENOSCOPY (EGD) WITH PROPOFOL ;  Surgeon: Saintclair Jasper, MD;  Location: [facility] ENDOSCOPY;  Service: Gastroenterology;  Laterality: N/A;   ESOPHAGOGASTRODUODENOSCOPY (EGD) WITH PROPOFOL  N/A 01/11/2022   Procedure: ESOPHAGOGASTRODUODENOSCOPY (EGD) WITH PROPOFOL ;  Surgeon: Rosalie Kitchens, MD;  Location: [facility] ENDOSCOPY;  Service: Gastroenterology;  Laterality: N/A;   ESOPHAGOGASTRODUODENOSCOPY (EGD) WITH PROPOFOL  N/A 01/06/2023   Procedure: ESOPHAGOGASTRODUODENOSCOPY (EGD) WITH PROPOFOL ;  Surgeon: Rosalie Kitchens, MD;  Location: [facility] ENDOSCOPY;  Service: Gastroenterology;  Laterality: N/A;   ESOPHAGOGASTRODUODENOSCOPY (EGD) WITH PROPOFOL  N/A 04/21/2023   Procedure: ESOPHAGOGASTRODUODENOSCOPY (EGD) WITH PROPOFOL ;  Surgeon: Dianna Specking, MD;  Location: [facility] ENDOSCOPY;  Service: Gastroenterology;  Laterality: N/A;   EYE SURGERY Bilateral    to improve vision after cataract OR   GIVENS CAPSULE STUDY N/A 04/21/2023   Procedure: GIVENS CAPSULE STUDY;  Surgeon: Dianna Specking, MD;  Location: [facility] ENDOSCOPY;  Service: Gastroenterology;  Laterality: N/A;   GIVENS CAPSULE STUDY N/A 11/18/2023   Procedure: IMAGING PROCEDURE, GI TRACT, INTRALUMINAL, VIA CAPSULE;  Surgeon: Dianna Specking, MD;  Location: [facility] ENDOSCOPY;  Service: Gastroenterology;  Laterality: N/A;   HEMOSTASIS CLIP PLACEMENT  10/26/2023   Procedure: CONTROL OF HEMORRHAGE, GI TRACT, ENDOSCOPIC, BY CLIPPING OR OVERSEWING;  Surgeon: Elicia Claw, MD;  Location: [facility]  ENDOSCOPY;  Service: Gastroenterology;;   IR ANGIOGRAM FOLLOW UP STUDY  12/19/2020   IR ANGIOGRAM SELECTIVE EACH ADDITIONAL VESSEL  12/19/2020   IR ANGIOGRAM SELECTIVE EACH ADDITIONAL VESSEL  12/19/2020   IR ANGIOGRAM SELECTIVE EACH ADDITIONAL VESSEL  12/28/2023   IR ANGIOGRAM VISCERAL SELECTIVE  12/19/2020   IR ANGIOGRAM VISCERAL SELECTIVE  12/28/2023   IR EMBO ART  VEN HEMORR LYMPH EXTRAV  INC GUIDE ROADMAPPING  12/19/2020   IR EMBO ART  VEN HEMORR LYMPH EXTRAV  INC GUIDE ROADMAPPING  12/28/2023   IR US  GUIDE VASC ACCESS RIGHT  12/19/2020   IR US  GUIDE VASC ACCESS RIGHT  12/28/2023   JOINT REPLACEMENT     POLYPECTOMY  05/04/2022   Procedure: POLYPECTOMY;  Surgeon: Saintclair Jasper, MD;  Location: [facility] ENDOSCOPY;  Service: Gastroenterology;;   POLYPECTOMY  10/26/2023   Procedure: POLYPECTOMY, INTESTINE;  Surgeon: Elicia Claw, MD;  Location: [facility] ENDOSCOPY;  Service: Gastroenterology;;   STAPEDES SURGERY Left    scraped stapedes because it was sticking when it wasn't suppose to   TONSILLECTOMY AND ADENOIDECTOMY  1946   TOTAL KNEE ARTHROPLASTY Left ~ 2008   TOTAL KNEE ARTHROPLASTY  12/20/2011   Procedure: TOTAL KNEE ARTHROPLASTY;  Surgeon: Elspeth JONELLE Her, MD;  Location: [facility] OR;  Service: Orthopedics;  Laterality: Right;  Right Total Knee Arthroplasty   TUBAL LIGATION  1980's    Allergies  Allergen Reactions   Morphine And Codeine Itching   Aspirin Other (See Comments)    Unknown reaction   Duloxetine  Hcl Other (See Comments)  Unknown reaction   Tymlos [Abaloparatide] Other (See Comments)    Unknown reaction   Ventolin  [Albuterol ] Other (See Comments)    rapid heart beat   Cefadroxil Hives    Patient can take amoxicillin  and cipro     Outpatient Encounter Medications as of 03/04/2024  Medication Sig   Acetaminophen  (TYLENOL  PO) Take 500 mg by mouth daily as needed.   acetaminophen  (TYLENOL ) 650 MG CR tablet Take 1,300 mg by mouth 2 (two) times daily.   albuterol  (VENTOLIN  HFA) 108 (90  Base) MCG/ACT inhaler Inhale 2 puffs into the lungs every 4 (four) hours as needed for wheezing or shortness of breath.   alendronate (FOSAMAX) 70 MG tablet Take 70 mg by mouth every Sunday. Take with a full glass of water on an empty stomach.   Ascorbic Acid  (VITAMIN C ) 1000 MG tablet Take 1,000 mg by mouth every morning.   cetirizine  (ZYRTEC ) 5 MG chewable tablet Chew 5 mg by mouth daily as needed for allergies.   Cholecalciferol  (VITAMIN D3) 50 MCG (2000 UT) TABS Take 2,000 Units by mouth every morning.   cyanocobalamin  (VITAMIN B12) 1000 MCG tablet Take 1,000 mcg by mouth daily.   Dorzolamide  HCl-Timolol  Mal PF 2-0.5 % SOLN Place 1 drop into both eyes in the morning and at bedtime.   ferrous gluconate  (FERGON) 240 (27 FE) MG tablet Take 240 mg by mouth every morning. Take without food   fluticasone  (FLONASE ) 50 MCG/ACT nasal spray Place 1 spray into both nostrils as needed for allergies or rhinitis.   guaifenesin  (ROBITUSSIN) 100 MG/5ML syrup Take 10 mLs by mouth every 4 (four) hours as needed for cough.   latanoprost  (XALATAN ) 0.005 % ophthalmic solution Place 1 drop into both eyes at bedtime.   loperamide  (IMODIUM  A-D) 2 MG tablet Take 4 mg by mouth as needed for diarrhea or loose stools.   Multiple Vitamins-Minerals (PRESERVISION AREDS 2) CAPS Take 1 capsule by mouth 2 (two) times daily.   Netarsudil  Dimesylate (RHOPRESSA ) 0.02 % SOLN Place 1 drop into both eyes at bedtime.   oxybutynin  (DITROPAN -XL) 10 MG 24 hr tablet Take 10 mg by mouth at bedtime.   pantoprazole  (PROTONIX ) 40 MG tablet Take 1 tablet (40 mg total) by mouth 2 (two) times daily before a meal.   Polyethylene Glycol 3350  (MIRALAX  PO) Take 8.6 g by mouth daily as needed.   potassium chloride  SA (KLOR-CON  M) 20 MEQ tablet Take 20 mEq by mouth daily.   Probiotic Product (ALIGN) 4 MG CAPS Take 4 mg by mouth at bedtime.   rOPINIRole  (REQUIP ) 0.5 MG tablet Take 1 tablet (0.5 mg total) by mouth daily as needed.   senna (SENOKOT)  8.6 MG TABS tablet Take 2 tablets by mouth at bedtime.   sertraline  (ZOLOFT ) 50 MG tablet Take 1.5 tablets (75 mg total) by mouth daily.   simvastatin  (ZOCOR ) 20 MG tablet Take 20 mg by mouth every evening.   Sodium Fluoride  (PREVIDENT 5000 BOOSTER PLUS) 1.1 % PSTE Place 1 Application onto teeth See admin instructions. Brush on teeth with a toothbrush after evening mouth care. Spit out excess and do not rinse.   torsemide  (DEMADEX ) 20 MG tablet Take 20 mg by mouth every morning.   traMADol  HCl 25 MG TABS Take 25 mg by mouth 2 (two) times daily.   umeclidinium-vilanterol (ANORO ELLIPTA ) 62.5-25 MCG/ACT AEPB Inhale 1 puff into the lungs every morning.   zinc oxide 20 % ointment Apply 1 Application topically as needed for irritation.   No facility-administered  encounter medications on file as of 03/04/2024.    Review of Systems  Constitutional:  Negative for fatigue and fever.  Respiratory:  Negative for cough and shortness of breath.   Cardiovascular:  Positive for leg swelling. Negative for chest pain.  Gastrointestinal:  Negative for abdominal pain and blood in stool.  Genitourinary:  Positive for dysuria, frequency and urgency. Negative for flank pain and hematuria.  Musculoskeletal:  Positive for gait problem.  Neurological:  Negative for dizziness.  Psychiatric/Behavioral:  Positive for dysphoric mood. Negative for confusion. The patient is not nervous/anxious.     Immunization History  Administered Date(s) Administered   Dtap, Unspecified 03/12/2018   Fluad Quad(high Dose 65+) 04/24/2022   Fluad Trivalent(High Dose 65+) 04/27/2023   Fluzone Influenza virus vaccine,trivalent (IIV3), split virus 09/22/2009, 03/29/2010, 07/23/2011, 04/03/2012, 03/30/2014   INFLUENZA, HIGH DOSE SEASONAL PF 04/15/2013, 03/14/2018   Influenza Inj Mdck Quad Pf 04/12/2017   Influenza, Mdck, Trivalent,PF 6+ MOS(egg free) 03/16/2016, 04/12/2017   Influenza, Quadrivalent, Recombinant, Inj, Pf 03/05/2019,  05/22/2021   Influenza,inj,Quad PF,6+ Mos 03/30/2014, 03/25/2015   PFIZER(Purple Top)SARS-COV-2 Vaccination 07/25/2019, 08/16/2019, 03/31/2020, 03/31/2021, 12/12/2021   Pneumococcal Conjugate-13 10/19/2013, 11/01/2014   Pneumococcal Polysaccharide-23 09/22/2009, 05/01/2010, 07/23/2011   Pneumococcal-Unspecified 03/02/2011   Td (Adult),5 Lf Tetanus Toxid, Preservative Free 07/23/2011   Tdap 09/22/2009, 07/23/2011, 03/12/2018   Zoster Recombinant(Shingrix) 10/08/2017, 12/09/2017   Zoster, Live 09/22/2009, 07/23/2011, 12/16/2017   Pertinent  Health Maintenance Due  Topic Date Due   DEXA SCAN  Never done   INFLUENZA VACCINE  01/30/2024      10/30/2022   12:44 PM 01/22/2023    1:15 PM 04/04/2023   11:00 AM 07/10/2023    9:02 AM 10/24/2023    2:27 PM  Fall Risk  Falls in the past year? 0 0 0 0 0  Was there an injury with Fall? 0 0 0 0 0  Fall Risk Category Calculator 0 0 0 0 0  Patient at Risk for Falls Due to History of fall(s);Impaired balance/gait;Impaired mobility History of fall(s);Impaired balance/gait History of fall(s);Impaired balance/gait;Impaired mobility History of fall(s);Impaired balance/gait;Impaired mobility History of fall(s);Impaired balance/gait;Impaired vision  Fall risk Follow up Falls evaluation completed;Education provided;Falls prevention discussed Falls evaluation completed;Education provided;Falls prevention discussed Falls evaluation completed;Education provided;Falls prevention discussed Falls evaluation completed;Education provided;Falls prevention discussed Falls evaluation completed;Education provided   Functional Status Survey:    Vitals:   03/04/24 1521  BP: 110/63  Pulse: 70  Temp: 97.9 F (36.6 C)  SpO2: 93%  Weight: 168 lb 12.8 oz (76.6 kg)  Height: 5' 1 (1.549 m)   Body mass index is 31.89 kg/m. Physical Exam Vitals reviewed.  Constitutional:      General: She is not in acute distress. HENT:     Head: Normocephalic.  Eyes:     General:         Right eye: No discharge.        Left eye: No discharge.  Cardiovascular:     Rate and Rhythm: Normal rate and regular rhythm.     Pulses: Normal pulses.     Heart sounds: Normal heart sounds.  Pulmonary:     Effort: Pulmonary effort is normal.     Breath sounds: Normal breath sounds.  Abdominal:     General: Bowel sounds are normal.     Palpations: Abdomen is soft.     Tenderness: There is no right CVA tenderness or left CVA tenderness.  Musculoskeletal:     Cervical back: Neck supple.  Right lower leg: Edema present.     Left lower leg: Edema present.     Comments: BLE non pitting  Skin:    General: Skin is warm.     Capillary Refill: Capillary refill takes less than 2 seconds.  Neurological:     General: No focal deficit present.     Mental Status: She is alert. Mental status is at baseline.     Gait: Gait abnormal.  Psychiatric:        Mood and Affect: Mood normal.     Labs reviewed: Recent Labs    11/18/23 1025 11/19/23 0302 11/19/23 0904 11/20/23 0254 11/21/23 0310 12/28/23 0411 12/29/23 0247 12/30/23 0324 12/31/23 0533  NA 138   < >  --  141   < > 140 139 139 140  K 3.0*   < >  --  3.8   < > 3.4* 3.6 3.4* 3.7  CL 105   < >  --  112*   < > 107 109 109 111  CO2 25   < >  --  23   < > 25 23 23 24   GLUCOSE 148*   < >  --  130*   < > 119* 99 122* 118*  BUN 25*   < >  --  13   < > 10 9 7* <5*  CREATININE 0.57   < >  --  <0.30*   < > 0.45 0.36* 0.40* 0.34*  CALCIUM  7.4*   < >  --  7.9*   < > 7.7* 8.2* 8.1* 8.4*  MG 1.9  --   --  2.0   < > 1.9 2.5* 2.3  --   PHOS 2.6  --  2.2* 1.8*  --   --   --   --   --    < > = values in this interval not displayed.   Recent Labs    11/19/23 0302 12/27/23 1130 12/28/23 0411  AST 14* 23 20  ALT 8 8 9   ALKPHOS 29* 65 49  BILITOT 0.8 0.8 1.9*  PROT 4.5* 6.1* 4.9*  ALBUMIN 2.6* 3.5 2.9*   Recent Labs    12/18/23 0000 12/27/23 1130 12/27/23 1139 12/28/23 0411 12/28/23 0843 12/29/23 1723 12/30/23 0324  12/31/23 0533  WBC 4.3 5.8  --  8.5   < > 6.7 4.8 4.5  NEUTROABS 2,137.00 4.0  --  7.1  --   --   --   --   HGB 11.0* 9.4*   < > 8.2*   < > 7.6* 7.1* 8.5*  HCT 35* 31.2*   < > 26.7*   < > 24.8* 23.8* 27.9*  MCV  --  99.7  --  97.4   < > 98.8 101.3* 98.2  PLT 184 170  --  121*   < > 145* 135* 160   < > = values in this interval not displayed.   Lab Results  Component Value Date   TSH 2.47 10/08/2022   No results found for: HGBA1C Lab Results  Component Value Date   CHOL 125 07/14/2023   HDL 51 07/14/2023   LDLCALC 53 07/14/2023   TRIG 130 07/14/2023    Significant Diagnostic Results in last 30 days:  No results found.  Assessment/Plan 1. Urinary frequency (Primary) - onset x 1 day - reports increased frequency/ bladder pressure - on oxybutynin  and torsemide  - 07/10 urine culture > 100,000 cfu/mL E. Coli> resolved with Cipro  x 3 days -  UA/culture  2. OAB (overactive bladder) - see above  3. Bilateral leg edema - BLE non pitting  - consider reducing torsemide  to 4x/week if urine culture negative - cont torsemide      Family/ staff Communication: plan discussed patient, husband and nurse  Labs/tests ordered:  UA/culture

## 2024-03-08 ENCOUNTER — Encounter: Payer: Self-pay | Admitting: Orthopedic Surgery

## 2024-03-08 ENCOUNTER — Non-Acute Institutional Stay: Payer: Self-pay | Admitting: Orthopedic Surgery

## 2024-03-08 DIAGNOSIS — N3000 Acute cystitis without hematuria: Secondary | ICD-10-CM | POA: Diagnosis not present

## 2024-03-08 DIAGNOSIS — N3281 Overactive bladder: Secondary | ICD-10-CM

## 2024-03-08 NOTE — Progress Notes (Signed)
 Location:   [facility]  Nursing Home Room Number: 20-A Place of Service:  ALF [phone]) Provider:  Greig Cluster, NP  PCP: Cluster Greig BRAVO, NP  Patient Care Team: Cluster Greig BRAVO, NP as PCP - General (Adult Health Nurse Practitioner) Charlanne Fredia CROME, MD as Consulting Physician (Internal Medicine) Apickup-Ot, A, OT as Occupational Therapist (Occupational Therapy)  Extended Emergency Contact Information Primary Emergency Contact: Vandervliet,[name] Address: [address]          [place], [place] [address] [place] Home Phone: [phone] Mobile Phone: [phone] Relation: Spouse Secondary Emergency Contact: Bui MD,[name] Ozell Crane Phone: [phone] Relation: Son Interpreter needed? No  Code Status:  DNR Goals of care: Advanced Directive information    03/08/2024    3:17 PM  Advanced Directives  Does Patient Have a Medical Advance Directive? Yes  Type of Advance Directive Out of facility DNR (pink MOST or yellow form)  Does patient want to make changes to medical advance directive? No - Patient declined     Chief Complaint  Patient presents with   Urinary Tract Infection    HPI:  Pt is a 83 y.o. female seen today for acute visit due to abnormal urine culture results.   She currently resides on the assisted living unit at [facility]. PMH: HTN, COPD, pulmonary nodule, Diverticulosis with GI bleed, GERD, OA, osteoporosis,macular degeneration, iron  def anemia, unstable gait, urinary incontinence, anxiety, and depression.    H/o urinary urgency and taking oxybutynin . Also on torsemide  lower leg edema. She reports increased urinary frequency and bladder pressure. Denies flank pain, fever. She does always drink fluids well. Also drinks caffeinated drinks. 07/10 urine culture > 100,000 cfu/mL E. Coli, resolved with Cipro  x 3 days.  09/05 UA 2+ leukocytes, nitrates negative. Urine culture reveal > 100,000 cfu/mL klebsiella pneumoniae. She continues to c/o urinary  frequency more than normal. Afebrile. Denies flank pain.    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, left side   Sleeping excessive    Tinnitus of both ears    Past Surgical History:  Procedure Laterality Date   BALLOON DILATION N/A 05/08/2021   Procedure: BALLOON DILATION;  Surgeon: Saintclair Jasper, MD;  Location: [facility] ENDOSCOPY;  Service: Gastroenterology;  Laterality: N/A;   BASAL CELL CARCINOMA EXCISION  2001   off my nose, left side   BIOPSY  05/08/2021   Procedure: BIOPSY;  Surgeon: Saintclair Jasper, MD;  Location: [facility] ENDOSCOPY;  Service: Gastroenterology;;   BIOPSY  04/25/2023   Procedure: BIOPSY;  Surgeon: Dianna Specking, MD;  Location: [facility] ENDOSCOPY;  Service: Gastroenterology;;   BLEPHAROPLASTY Bilateral    BONE BIOPSY  10/26/2023   Procedure: BIOPSY, GI;  Surgeon: Elicia Claw, MD;  Location: [facility] ENDOSCOPY;  Service: Gastroenterology;;   CATARACT EXTRACTION W/ INTRAOCULAR LENS  IMPLANT, BILATERAL Bilateral 1990's   COLONOSCOPY N/A 10/26/2023  Procedure: COLONOSCOPY;  Surgeon: Elicia Claw, MD;  Location: [facility] ENDOSCOPY;  Service: Gastroenterology;  Laterality: N/A;   COLONOSCOPY WITH PROPOFOL  N/A 05/04/2022   Procedure: COLONOSCOPY WITH PROPOFOL ;  Surgeon: Saintclair Jasper, MD;  Location: [facility] ENDOSCOPY;  Service: Gastroenterology;  Laterality: N/A;   COLONOSCOPY WITH PROPOFOL  N/A 04/25/2023   Procedure: COLONOSCOPY WITH PROPOFOL ;  Surgeon: Dianna Specking, MD;  Location: [facility] ENDOSCOPY;  Service: Gastroenterology;  Laterality: N/A;   ENTEROSCOPY N/A 11/20/2023   Procedure: ENTEROSCOPY;  Surgeon: Dianna Specking, MD;  Location: [facility] ENDOSCOPY;  Service: Gastroenterology;  Laterality: N/A;   ESOPHAGOGASTRODUODENOSCOPY N/A 10/26/2023   Procedure: EGD (ESOPHAGOGASTRODUODENOSCOPY);  Surgeon: Elicia Claw, MD;  Location: [facility] END[facility]COPY;  Service: Gastroenterology;  Laterality: N/A;   ESOPHAGOGASTRODUODENOSCOPY N/A 11/20/2023   Procedure: EGD (ESOPHAGOGASTRODUODENOSCOPY);  Surgeon: Dianna Specking, MD;  Location: [facility] ENDOSCOPY;  Service: Gastroenterology;  Laterality: N/A;   ESOPHAGOGASTRODUODENOSCOPY (EGD) WITH PROPOFOL  N/A 05/08/2021   Procedure: ESOPHAGOGASTRODUODENOSCOPY (EGD) WITH PROPOFOL ;  Surgeon: Saintclair Jasper, MD;  Location: [facility] ENDOSCOPY;  Service: Gastroenterology;  Laterality: N/A;   ESOPHAGOGASTRODUODENOSCOPY (EGD) WITH PROPOFOL  N/A 01/11/2022   Procedure: ESOPHAGOGASTRODUODENOSCOPY (EGD) WITH PROPOFOL ;  Surgeon: Rosalie Kitchens, MD;  Location: [facility] ENDOSCOPY;  Service: Gastroenterology;  Laterality: N/A;   ESOPHAGOGASTRODUODENOSCOPY (EGD) WITH PROPOFOL  N/A 01/06/2023   Procedure: ESOPHAGOGASTRODUODENOSCOPY (EGD) WITH PROPOFOL ;  Surgeon: Rosalie Kitchens, MD;  Location: [facility] ENDOSCOPY;  Service: Gastroenterology;  Laterality: N/A;   ESOPHAGOGASTRODUODENOSCOPY (EGD) WITH PROPOFOL  N/A 04/21/2023   Procedure: ESOPHAGOGASTRODUODENOSCOPY (EGD) WITH PROPOFOL ;  Surgeon: Dianna Specking, MD;  Location: [facility] ENDOSCOPY;  Service: Gastroenterology;  Laterality: N/A;   EYE SURGERY Bilateral    to improve vision after cataract OR   GIVENS CAPSULE STUDY N/A 04/21/2023   Procedure: GIVENS CAPSULE STUDY;  Surgeon: Dianna Specking, MD;  Location: [facility] ENDOSCOPY;  Service: Gastroenterology;  Laterality: N/A;   GIVENS CAPSULE STUDY N/A 11/18/2023   Procedure: IMAGING PROCEDURE, GI TRACT, INTRALUMINAL, VIA CAPSULE;  Surgeon: Dianna Specking, MD;  Location: [facility] ENDOSCOPY;   Service: Gastroenterology;  Laterality: N/A;   HEMOSTASIS CLIP PLACEMENT  10/26/2023   Procedure: CONTROL OF HEMORRHAGE, GI TRACT, ENDOSCOPIC, BY CLIPPING OR OVERSEWING;  Surgeon: Elicia Claw, MD;  Location: [facility] ENDOSCOPY;  Service: Gastroenterology;;   IR ANGIOGRAM FOLLOW UP STUDY  12/19/2020   IR ANGIOGRAM SELECTIVE EACH ADDITIONAL VESSEL  12/19/2020   IR ANGIOGRAM SELECTIVE EACH ADDITIONAL VESSEL  12/19/2020   IR ANGIOGRAM SELECTIVE EACH ADDITIONAL VESSEL  12/28/2023   IR ANGIOGRAM VISCERAL SELECTIVE  12/19/2020   IR ANGIOGRAM VISCERAL SELECTIVE  12/28/2023   IR EMBO ART  VEN HEMORR LYMPH EXTRAV  INC GUIDE ROADMAPPING  12/19/2020   IR EMBO ART  VEN HEMORR LYMPH EXTRAV  INC GUIDE ROADMAPPING  12/28/2023   IR US  GUIDE VASC ACCESS RIGHT  12/19/2020   IR US  GUIDE VASC ACCESS RIGHT  12/28/2023   JOINT REPLACEMENT     POLYPECTOMY  05/04/2022   Procedure: POLYPECTOMY;  Surgeon: Saintclair Jasper, MD;  Location: [facility] ENDOSCOPY;  Service: Gastroenterology;;   POLYPECTOMY  10/26/2023   Procedure: POLYPECTOMY, INTESTINE;  Surgeon: Elicia Claw, MD;  Location: [facility] ENDOSCOPY;  Service: Gastroenterology;;   STAPEDES SURGERY Left    scraped stapedes because it was sticking when it wasn't suppose to   TONSILLECTOMY AND ADENOIDECTOMY  1946   TOTAL KNEE ARTHROPLASTY Left ~ 2008   TOTAL KNEE ARTHROPLASTY  12/20/2011   Procedure: TOTAL KNEE ARTHROPLASTY;  Surgeon: Elspeth JONELLE Her, MD;  Location: [facility] OR;  Service: Orthopedics;  Laterality: Right;  Right Total Knee Arthroplasty  TUBAL LIGATION  1980's    Allergies  Allergen Reactions   Morphine And Codeine Itching   Aspirin Other (See Comments)    Unknown reaction   Duloxetine  Hcl Other (See Comments)    Unknown reaction   Tymlos [Abaloparatide] Other (See Comments)    Unknown reaction   Ventolin  [Albuterol ] Other (See Comments)    rapid heart beat   Cefadroxil Hives    Patient can take amoxicillin  and cipro     Allergies as of 03/08/2024       Reactions    Morphine And Codeine Itching   Aspirin Other (See Comments)   Unknown reaction   Duloxetine  Hcl Other (See Comments)   Unknown reaction   Tymlos [abaloparatide] Other (See Comments)   Unknown reaction   Ventolin  [albuterol ] Other (See Comments)   rapid heart beat   Cefadroxil Hives   Patient can take amoxicillin  and cipro         Medication List        Accurate as of March 08, 2024  3:17 PM. If you have any questions, ask your nurse or doctor.          albuterol  108 (90 Base) MCG/ACT inhaler Commonly known as: VENTOLIN  HFA Inhale 2 puffs into the lungs every 4 (four) hours as needed for wheezing or shortness of breath.   alendronate 70 MG tablet Commonly known as: FOSAMAX Take 70 mg by mouth every Sunday. Take with a full glass of water on an empty stomach.   Align 4 MG Caps Take 4 mg by mouth at bedtime.   Anoro Ellipta  62.5-25 MCG/ACT Aepb Generic drug: umeclidinium-vilanterol Inhale 1 puff into the lungs every morning.   cetirizine  5 MG chewable tablet Commonly known as: ZYRTEC  Chew 5 mg by mouth daily as needed for allergies.   ciprofloxacin  500 MG tablet Commonly known as: CIPRO  Take 500 mg by mouth 2 (two) times daily.   cyanocobalamin  1000 MCG tablet Commonly known as: VITAMIN B12 Take 1,000 mcg by mouth daily.   Dorzolamide  HCl-Timolol  Mal PF 2-0.5 % Soln Place 1 drop into both eyes in the morning and at bedtime.   ferrous gluconate  240 (27 FE) MG tablet Commonly known as: FERGON Take 240 mg by mouth every morning. Take without food   fluticasone  50 MCG/ACT nasal spray Commonly known as: FLONASE  Place 1 spray into both nostrils as needed for allergies or rhinitis.   guaifenesin  100 MG/5ML syrup Commonly known as: ROBITUSSIN Take 10 mLs by mouth every 4 (four) hours as needed for cough.   latanoprost  0.005 % ophthalmic solution Commonly known as: XALATAN  Place 1 drop into both eyes at bedtime.   loperamide  2 MG tablet Commonly known  as: IMODIUM  A-D Take 4 mg by mouth as needed for diarrhea or loose stools.   MIRALAX  PO Take 8.6 g by mouth daily as needed.   oxybutynin  10 MG 24 hr tablet Commonly known as: DITROPAN -XL Take 10 mg by mouth at bedtime.   pantoprazole  40 MG tablet Commonly known as: PROTONIX  Take 1 tablet (40 mg total) by mouth 2 (two) times daily before a meal.   potassium chloride  SA 20 MEQ tablet Commonly known as: KLOR-CON  M Take 20 mEq by mouth daily.   PreserVision AREDS 2 Caps Take 1 capsule by mouth 2 (two) times daily.   PreviDent 5000 Booster Plus 1.1 % Pste Generic drug: Sodium Fluoride  Place 1 Application onto teeth See admin instructions. Brush on teeth with a toothbrush after evening mouth care. Spit out  excess and do not rinse.   Rhopressa  0.02 % Soln Generic drug: Netarsudil  Dimesylate Place 1 drop into both eyes at bedtime.   rOPINIRole  0.5 MG tablet Commonly known as: REQUIP  Take 1 tablet (0.5 mg total) by mouth daily as needed.   senna 8.6 MG Tabs tablet Commonly known as: SENOKOT Take 2 tablets by mouth at bedtime.   sertraline  50 MG tablet Commonly known as: ZOLOFT  Take 1.5 tablets (75 mg total) by mouth daily.   simvastatin  20 MG tablet Commonly known as: ZOCOR  Take 20 mg by mouth every evening.   torsemide  20 MG tablet Commonly known as: DEMADEX  Take 20 mg by mouth every morning.   traMADol  HCl 25 MG Tabs Take 25 mg by mouth 2 (two) times daily.   TYLENOL  PO Take 500 mg by mouth daily as needed.   acetaminophen  650 MG CR tablet Commonly known as: TYLENOL  Take 1,300 mg by mouth 2 (two) times daily.   vitamin C  1000 MG tablet Take 1,000 mg by mouth every morning.   Vitamin D3 50 MCG (2000 UT) Tabs Take 2,000 Units by mouth every morning.   zinc oxide 20 % ointment Apply 1 Application topically as needed for irritation.        Review of Systems  Constitutional:  Negative for fatigue and fever.  Respiratory:  Negative for cough and shortness  of breath.   Cardiovascular:  Negative for chest pain and leg swelling.  Gastrointestinal:  Negative for abdominal distention and abdominal pain.  Genitourinary:  Positive for dysuria and frequency. Negative for flank pain and hematuria.  Musculoskeletal:  Positive for gait problem.  Psychiatric/Behavioral:  Negative for confusion and dysphoric mood. The patient is not nervous/anxious.     Immunization History  Administered Date(s) Administered   Dtap, Unspecified 03/12/2018   Fluad Quad(high Dose 65+) 04/24/2022   Fluad Trivalent(High Dose 65+) 04/27/2023   Fluzone Influenza virus vaccine,trivalent (IIV3), split virus 09/22/2009, 03/29/2010, 07/23/2011, 04/03/2012, 03/30/2014   INFLUENZA, HIGH DOSE SEASONAL PF 04/15/2013, 03/14/2018   Influenza Inj Mdck Quad Pf 04/12/2017   Influenza, Mdck, Trivalent,PF 6+ MOS(egg free) 03/16/2016, 04/12/2017   Influenza, Quadrivalent, Recombinant, Inj, Pf 03/05/2019, 05/22/2021   Influenza,inj,Quad PF,6+ Mos 03/30/2014, 03/25/2015   PFIZER(Purple Top)SARS-COV-2 Vaccination 07/25/2019, 08/16/2019, 03/31/2020, 03/31/2021, 12/12/2021   Pneumococcal Conjugate-13 10/19/2013, 11/01/2014   Pneumococcal Polysaccharide-23 09/22/2009, 05/01/2010, 07/23/2011   Pneumococcal-Unspecified 03/02/2011   Td (Adult),5 Lf Tetanus Toxid, Preservative Free 07/23/2011   Tdap 09/22/2009, 07/23/2011, 03/12/2018   Zoster Recombinant(Shingrix) 10/08/2017, 12/09/2017   Zoster, Live 09/22/2009, 07/23/2011, 12/16/2017   Pertinent  Health Maintenance Due  Topic Date Due   DEXA SCAN  Never done   Influenza Vaccine  01/30/2024      01/22/2023    1:15 PM 04/04/2023   11:00 AM 07/10/2023    9:02 AM 10/24/2023    2:27 PM 03/04/2024    6:22 PM  Fall Risk  Falls in the past year? 0 0 0 0 0  Was there an injury with Fall? 0 0 0 0 0  Fall Risk Category Calculator 0 0 0 0 0  Patient at Risk for Falls Due to History of fall(s);Impaired balance/gait History of fall(s);Impaired  balance/gait;Impaired mobility History of fall(s);Impaired balance/gait;Impaired mobility History of fall(s);Impaired balance/gait;Impaired vision History of fall(s)  Fall risk Follow up Falls evaluation completed;Education provided;Falls prevention discussed Falls evaluation completed;Education provided;Falls prevention discussed Falls evaluation completed;Education provided;Falls prevention discussed Falls evaluation completed;Education provided Falls evaluation completed   Functional Status Survey:    Vitals:  03/08/24 1506  BP: 110/63  Pulse: 70  Resp: 20  Temp: 97.9 F (36.6 C)  SpO2: 93%  Weight: 168 lb 12.8 oz (76.6 kg)  Height: 5' 1 (1.549 m)   Body mass index is 31.89 kg/m. Physical Exam Vitals reviewed.  Constitutional:      General: She is not in acute distress. HENT:     Head: Normocephalic.  Eyes:     General:        Right eye: No discharge.        Left eye: No discharge.  Cardiovascular:     Rate and Rhythm: Normal rate and regular rhythm.     Pulses: Normal pulses.     Heart sounds: Normal heart sounds.  Pulmonary:     Effort: Pulmonary effort is normal. No respiratory distress.     Breath sounds: Normal breath sounds. No wheezing or rales.  Abdominal:     General: Bowel sounds are normal.     Palpations: Abdomen is soft.     Tenderness: There is no right CVA tenderness or left CVA tenderness.  Musculoskeletal:     Cervical back: Neck supple.     Right lower leg: No edema.     Left lower leg: No edema.  Skin:    General: Skin is warm.     Capillary Refill: Capillary refill takes less than 2 seconds.  Neurological:     General: No focal deficit present.     Mental Status: She is alert and oriented to person, place, and time.     Motor: Weakness present.     Gait: Gait abnormal.  Psychiatric:        Mood and Affect: Mood normal.     Labs reviewed: Recent Labs    11/18/23 1025 11/19/23 0302 11/19/23 0904 11/20/23 0254 11/21/23 0310  12/28/23 0411 12/29/23 0247 12/30/23 0324 12/31/23 0533  NA 138   < >  --  141   < > 140 139 139 140  K 3.0*   < >  --  3.8   < > 3.4* 3.6 3.4* 3.7  CL 105   < >  --  112*   < > 107 109 109 111  CO2 25   < >  --  23   < > 25 23 23 24   GLUCOSE 148*   < >  --  130*   < > 119* 99 122* 118*  BUN 25*   < >  --  13   < > 10 9 7* <5*  CREATININE 0.57   < >  --  <0.30*   < > 0.45 0.36* 0.40* 0.34*  CALCIUM  7.4*   < >  --  7.9*   < > 7.7* 8.2* 8.1* 8.4*  MG 1.9  --   --  2.0   < > 1.9 2.5* 2.3  --   PHOS 2.6  --  2.2* 1.8*  --   --   --   --   --    < > = values in this interval not displayed.   Recent Labs    11/19/23 0302 12/27/23 1130 12/28/23 0411  AST 14* 23 20  ALT 8 8 9   ALKPHOS 29* 65 49  BILITOT 0.8 0.8 1.9*  PROT 4.5* 6.1* 4.9*  ALBUMIN 2.6* 3.5 2.9*   Recent Labs    12/18/23 0000 12/27/23 1130 12/27/23 1139 12/28/23 0411 12/28/23 0843 12/29/23 1723 12/30/23 0324 12/31/23 0533  WBC 4.3 5.8  --  8.5   < > 6.7 4.8 4.5  NEUTROABS 2,137.00 4.0  --  7.1  --   --   --   --   HGB 11.0* 9.4*   < > 8.2*   < > 7.6* 7.1* 8.5*  HCT 35* 31.2*   < > 26.7*   < > 24.8* 23.8* 27.9*  MCV  --  99.7  --  97.4   < > 98.8 101.3* 98.2  PLT 184 170  --  121*   < > 145* 135* 160   < > = values in this interval not displayed.   Lab Results  Component Value Date   TSH 2.47 10/08/2022   No results found for: HGBA1C Lab Results  Component Value Date   CHOL 125 07/14/2023   HDL 51 07/14/2023   LDLCALC 53 07/14/2023   TRIG 130 07/14/2023    Significant Diagnostic Results in last 30 days:  No results found.  Assessment/Plan 1. Acute cystitis without hematuria (Primary) - 09/05 urine culture > 100,000 cfu/mL klebsiella pneumoniae - will start cipro  x 5 days -  07/10 urine culture > 100,000 cfu/mL E. Coli> resolved with Cipro  x 3 days  - consider starting prophylactic ABX for recurrent UTI if 3rd episode occurs   2. OAB (overactive bladder) - cont oxybutynin      Family/  staff Communication: plan discussed with patient and nurse  Labs/tests ordered:  none

## 2024-03-09 DIAGNOSIS — R41841 Cognitive communication deficit: Secondary | ICD-10-CM | POA: Diagnosis not present

## 2024-03-09 DIAGNOSIS — R29898 Other symptoms and signs involving the musculoskeletal system: Secondary | ICD-10-CM | POA: Diagnosis not present

## 2024-03-09 DIAGNOSIS — M6281 Muscle weakness (generalized): Secondary | ICD-10-CM | POA: Diagnosis not present

## 2024-03-09 DIAGNOSIS — K9289 Other specified diseases of the digestive system: Secondary | ICD-10-CM | POA: Diagnosis not present

## 2024-03-11 DIAGNOSIS — R29898 Other symptoms and signs involving the musculoskeletal system: Secondary | ICD-10-CM | POA: Diagnosis not present

## 2024-03-11 DIAGNOSIS — K9289 Other specified diseases of the digestive system: Secondary | ICD-10-CM | POA: Diagnosis not present

## 2024-03-11 DIAGNOSIS — M6281 Muscle weakness (generalized): Secondary | ICD-10-CM | POA: Diagnosis not present

## 2024-03-11 DIAGNOSIS — R41841 Cognitive communication deficit: Secondary | ICD-10-CM | POA: Diagnosis not present

## 2024-03-12 ENCOUNTER — Encounter: Payer: Self-pay | Admitting: Orthopedic Surgery

## 2024-03-12 ENCOUNTER — Non-Acute Institutional Stay: Payer: Self-pay | Admitting: Orthopedic Surgery

## 2024-03-12 DIAGNOSIS — R410 Disorientation, unspecified: Secondary | ICD-10-CM

## 2024-03-12 DIAGNOSIS — U071 COVID-19: Secondary | ICD-10-CM | POA: Diagnosis not present

## 2024-03-12 DIAGNOSIS — I10 Essential (primary) hypertension: Secondary | ICD-10-CM | POA: Diagnosis not present

## 2024-03-12 DIAGNOSIS — R000 Tachycardia, unspecified: Secondary | ICD-10-CM

## 2024-03-12 DIAGNOSIS — N3000 Acute cystitis without hematuria: Secondary | ICD-10-CM | POA: Diagnosis not present

## 2024-03-12 MED ORDER — DOXYCYCLINE HYCLATE 100 MG PO TABS: 100.0000 mg | ORAL_TABLET | Freq: Two times a day (BID) | ORAL | Status: DC

## 2024-03-12 MED ORDER — NIRMATRELVIR/RITONAVIR (PAXLOVID)TABLET: 3.0000 | ORAL_TABLET | Freq: Two times a day (BID) | ORAL | Status: DC

## 2024-03-12 NOTE — Progress Notes (Addendum)
 Location:  [facility] Room Number: 20/A Place of Service:  ALF [phone]) Provider:  Greig FORBES Cluster, NP   Cluster Greig FORBES, NP  Patient Care Team: Cluster Greig FORBES, NP as PCP - General (Adult Health Nurse Practitioner) Charlanne Fredia CROME, MD as Consulting Physician (Internal Medicine) Apickup-Ot, A, OT as Occupational Therapist (Occupational Therapy)  Extended Emergency Contact Information Primary Emergency Contact: Crisanti,[name] Address: [address]          [place], [place] [address] [place] Home Phone: [phone] Mobile Phone: [phone] Relation: Spouse Secondary Emergency Contact: Span MD,[name] Ozell Crane Phone: [phone] Relation: Son Interpreter needed? No  Code Status:  DNR Goals of care: Advanced Directive information    03/08/2024    3:17 PM  Advanced Directives  Does Patient Have a Medical Advance Directive? Yes  Type of Advance Directive Out of facility DNR (pink MOST or yellow form)  Does patient want to make changes to medical advance directive? No - Patient declined     Chief Complaint  Patient presents with   Acute Visit    Nausea, tachycardia    HPI:  Pt is a 83 y.o. female seen today for acute visit due to nausea and tachycardia.   She currently resides on the assisted living unit at [facility]. PMH: HTN, COPD, pulmonary nodule, Diverticulosis with GI bleed, GERD, OA, osteoporosis,macular degeneration, iron  def anemia, unstable gait, urinary incontinence, anxiety, and depression.   This morning she woke up and c/o runny nose, nausea and headache. She was unable to have a BM this morning. Described BM yesterday as  small hard stools. She was not willing to take miralax  this morning. Recent exposure to covid by [facility] staff member. Covid test positive this morning. 09/08 she was started on Cipro  due to urine culture > 100,000 cfu/mL klebsiella pneumoniae. She appears confused and tired during out encounter. HR 107, oxygen   sat 87% on room air.    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, left side   Sleeping excessive    Tinnitus of both ears    Past Surgical History:  Procedure Laterality Date   BALLOON DILATION N/A 05/08/2021   Procedure: BALLOON DILATION;  Surgeon: Saintclair Jasper, MD;  Location: [facility] ENDOSCOPY;  Service: Gastroenterology;  Laterality: N/A;   BASAL CELL CARCINOMA EXCISION  2001   off my nose, left side   BIOPSY  05/08/2021   Procedure: BIOPSY;  Surgeon: Saintclair Jasper, MD;  Location: [facility] ENDOSCOPY;  Service: Gastroenterology;;   BIOPSY  04/25/2023   Procedure: BIOPSY;  Surgeon: Dianna Specking, MD;  Location: [facility] ENDOSCOPY;  Service: Gastroenterology;;   BLEPHAROPLASTY Bilateral    BONE BIOPSY  10/26/2023   Procedure: BIOPSY, GI;  Surgeon: Elicia Claw, MD;  Location: [facility] ENDOSCOPY;  Service: Gastroenterology;;   CATARACT EXTRACTION W/ INTRAOCULAR LENS  IMPLANT, BILATERAL Bilateral  1990's   COLONOSCOPY N/A 10/26/2023   Procedure: COLONOSCOPY;  Surgeon: Elicia Claw, MD;  Location: [facility] ENDOSCOPY;  Service: Gastroenterology;  Laterality: N/A;   COLONOSCOPY WITH PROPOFOL  N/A 05/04/2022   Procedure: COLONOSCOPY WITH PROPOFOL ;  Surgeon: Saintclair Jasper, MD;  Location: [facility] ENDOSCOPY;  Service: Gastroenterology;  Laterality: N/A;   COLONOSCOPY WITH PROPOFOL  N/A 04/25/2023   Procedure: COLONOSCOPY WITH PROPOFOL ;  Surgeon: Dianna Specking, MD;  Location: [facility]  ENDOSCOPY;  Service: Gastroenterology;  Laterality: N/A;   ENTEROSCOPY N/A 11/20/2023   Procedure: ENTEROSCOPY;  Surgeon: Dianna Specking, MD;  Location: [facility] ENDOSCOPY;  Service: Gastroenterology;  Laterality: N/A;   ESOPHAGOGASTRODUODENOSCOPY N/A 10/26/2023   Procedure: EGD (ESOPHAGOGASTRODUODENOSCOPY);  Surgeon: Elicia Claw, MD;  Location: [facility];  Service: Gastroenterology;  Laterality: N/A;   ESOPHAGOGASTRODUODENOSCOPY N/A 11/20/2023   Procedure: EGD (ESOPHAGOGASTRODUODENOSCOPY);  Surgeon: Dianna Specking, MD;  Location: [facility] ENDOSCOPY;  Service: Gastroenterology;  Laterality: N/A;   ESOPHAGOGASTRODUODENOSCOPY (EGD) WITH PROPOFOL  N/A 05/08/2021   Procedure: ESOPHAGOGASTRODUODENOSCOPY (EGD) WITH PROPOFOL ;  Surgeon: Saintclair Jasper, MD;  Location: [facility] ENDOSCOPY;  Service: Gastroenterology;  Laterality: N/A;   ESOPHAGOGASTRODUODENOSCOPY (EGD) WITH PROPOFOL  N/A 01/11/2022   Procedure: ESOPHAGOGASTRODUODENOSCOPY (EGD) WITH PROPOFOL ;  Surgeon: Rosalie Kitchens, MD;  Location: [facility] ENDOSCOPY;  Service: Gastroenterology;  Laterality: N/A;   ESOPHAGOGASTRODUODENOSCOPY (EGD) WITH PROPOFOL  N/A 01/06/2023   Procedure: ESOPHAGOGASTRODUODENOSCOPY (EGD) WITH PROPOFOL ;  Surgeon: Rosalie Kitchens, MD;  Location: [facility] ENDOSCOPY;  Service: Gastroenterology;  Laterality: N/A;   ESOPHAGOGASTRODUODENOSCOPY (EGD) WITH PROPOFOL  N/A 04/21/2023   Procedure: ESOPHAGOGASTRODUODENOSCOPY (EGD) WITH PROPOFOL ;  Surgeon: Dianna Specking, MD;  Location: [facility] ENDOSCOPY;  Service: Gastroenterology;  Laterality: N/A;   EYE SURGERY Bilateral    to improve vision after cataract OR   GIVENS CAPSULE STUDY N/A 04/21/2023   Procedure: GIVENS CAPSULE STUDY;  Surgeon: Dianna Specking, MD;  Location: [facility] ENDOSCOPY;  Service: Gastroenterology;  Laterality: N/A;   GIVENS CAPSULE STUDY N/A 11/18/2023   Procedure: IMAGING PROCEDURE, GI TRACT, INTRALUMINAL, VIA CAPSULE;  Surgeon: Dianna Specking, MD;  Location: [facility] ENDOSCOPY;  Service:  Gastroenterology;  Laterality: N/A;   HEMOSTASIS CLIP PLACEMENT  10/26/2023   Procedure: CONTROL OF HEMORRHAGE, GI TRACT, ENDOSCOPIC, BY CLIPPING OR OVERSEWING;  Surgeon: Elicia Claw, MD;  Location: [facility] ENDOSCOPY;  Service: Gastroenterology;;   IR ANGIOGRAM FOLLOW UP STUDY  12/19/2020   IR ANGIOGRAM SELECTIVE EACH ADDITIONAL VESSEL  12/19/2020   IR ANGIOGRAM SELECTIVE EACH ADDITIONAL VESSEL  12/19/2020   IR ANGIOGRAM SELECTIVE EACH ADDITIONAL VESSEL  12/28/2023   IR ANGIOGRAM VISCERAL SELECTIVE  12/19/2020   IR ANGIOGRAM VISCERAL SELECTIVE  12/28/2023   IR EMBO ART  VEN HEMORR LYMPH EXTRAV  INC GUIDE ROADMAPPING  12/19/2020   IR EMBO ART  VEN HEMORR LYMPH EXTRAV  INC GUIDE ROADMAPPING  12/28/2023   IR US  GUIDE VASC ACCESS RIGHT  12/19/2020   IR US  GUIDE VASC ACCESS RIGHT  12/28/2023   JOINT REPLACEMENT     POLYPECTOMY  05/04/2022   Procedure: POLYPECTOMY;  Surgeon: Saintclair Jasper, MD;  Location: [facility] ENDOSCOPY;  Service: Gastroenterology;;   POLYPECTOMY  10/26/2023   Procedure: POLYPECTOMY, INTESTINE;  Surgeon: Elicia Claw, MD;  Location: [facility] ENDOSCOPY;  Service: Gastroenterology;;   STAPEDES SURGERY Left    scraped stapedes because it was sticking when it wasn't suppose to   TONSILLECTOMY AND ADENOIDECTOMY  1946   TOTAL KNEE ARTHROPLASTY Left ~ 2008   TOTAL KNEE ARTHROPLASTY  12/20/2011   Procedure: TOTAL KNEE ARTHROPLASTY;  Surgeon: Elspeth JONELLE Her, MD;  Location: [facility] OR;  Service: Orthopedics;  Laterality: Right;  Right Total Knee Arthroplasty   TUBAL LIGATION  1980's    Allergies  Allergen Reactions   Morphine And Codeine Itching   Aspirin Other (See Comments)    Unknown reaction   Duloxetine  Hcl Other (See Comments)    Unknown reaction   Tymlos [Abaloparatide] Other (See Comments)    Unknown reaction   Ventolin  [Albuterol ] Other (See Comments)    rapid heart beat   Cefadroxil Hives    Patient can take amoxicillin  and cipro     Outpatient Encounter Medications as of 03/12/2024   Medication Sig   Acetaminophen  (TYLENOL  PO) Take 500 mg by mouth daily as needed.   acetaminophen  (TYLENOL ) 650 MG CR tablet Take 1,300 mg by mouth 2 (two) times daily.   albuterol  (VENTOLIN  HFA) 108 (90 Base) MCG/ACT inhaler Inhale 2 puffs into the lungs every 4 (four) hours as needed for wheezing or shortness of breath.   alendronate (FOSAMAX) 70 MG tablet Take 70 mg by mouth every Sunday. Take with a full glass of water on an empty stomach.   Ascorbic Acid  (VITAMIN C ) 1000 MG tablet Take 1,000 mg by mouth every morning.   cetirizine  (ZYRTEC ) 5 MG chewable tablet Chew 5 mg by mouth daily as needed for allergies.   Cholecalciferol  (VITAMIN D3) 50 MCG (2000 UT) TABS Take 2,000 Units by mouth every morning.   ciprofloxacin  (CIPRO ) 500 MG tablet Take 500 mg by mouth 2 (two) times daily.   cyanocobalamin  (VITAMIN B12) 1000 MCG tablet Take 1,000 mcg by mouth daily.   Dorzolamide  HCl-Timolol  Mal PF 2-0.5 % SOLN Place 1 drop into both eyes in the morning and at bedtime.   ferrous gluconate  (FERGON) 240 (27 FE) MG tablet Take 240 mg by mouth every morning. Take without food   fluticasone  (FLONASE ) 50 MCG/ACT nasal spray Place 1 spray into both nostrils as needed for allergies or rhinitis.   guaifenesin  (ROBITUSSIN) 100 MG/5ML syrup Take 10 mLs by mouth every 4 (four) hours as needed for cough.   latanoprost  (XALATAN ) 0.005 % ophthalmic solution Place 1 drop into both eyes at bedtime.   loperamide  (IMODIUM  A-D) 2 MG tablet Take 4 mg by mouth as needed for diarrhea or loose stools.   Multiple Vitamins-Minerals (PRESERVISION AREDS 2) CAPS Take 1 capsule by mouth 2 (two) times daily.   Netarsudil  Dimesylate (RHOPRESSA ) 0.02 % SOLN Place 1 drop into both eyes at bedtime.   oxybutynin  (DITROPAN -XL) 10 MG 24 hr tablet Take 10 mg by mouth at bedtime.   pantoprazole  (PROTONIX ) 40 MG tablet Take 1 tablet (40 mg total) by mouth 2 (two) times daily before a meal.   Polyethylene Glycol 3350  (MIRALAX  PO) Take 8.6 g  by mouth daily as needed.   potassium chloride  SA (KLOR-CON  M) 20 MEQ tablet Take 20 mEq by mouth daily.   Probiotic Product (ALIGN) 4 MG CAPS Take 4 mg by mouth at bedtime.   rOPINIRole  (REQUIP ) 0.5 MG tablet Take 1 tablet (0.5 mg total) by mouth daily as needed.   senna (SENOKOT) 8.6 MG TABS tablet Take 2 tablets by mouth at bedtime.   sertraline  (ZOLOFT ) 50 MG tablet Take 1.5 tablets (75 mg total) by mouth daily.   simvastatin  (ZOCOR ) 20 MG tablet Take 20 mg by mouth every evening.   Sodium Fluoride  (PREVIDENT 5000 BOOSTER PLUS) 1.1 % PSTE Place 1 Application onto teeth See admin instructions. Brush on teeth with a toothbrush after evening mouth care. Spit out excess and do not rinse.  torsemide  (DEMADEX ) 20 MG tablet Take 20 mg by mouth every morning.   traMADol  HCl 25 MG TABS Take 25 mg by mouth 2 (two) times daily.   umeclidinium-vilanterol (ANORO ELLIPTA ) 62.5-25 MCG/ACT AEPB Inhale 1 puff into the lungs every morning.   zinc oxide 20 % ointment Apply 1 Application topically as needed for irritation.   No facility-administered encounter medications on file as of 03/12/2024.    Review of Systems  Unable to perform ROS: Mental status change    Immunization History  Administered Date(s) Administered   Dtap, Unspecified 03/12/2018   Fluad Quad(high Dose 65+) 04/24/2022   Fluad Trivalent(High Dose 65+) 04/27/2023   Fluzone Influenza virus vaccine,trivalent (IIV3), split virus 09/22/2009, 03/29/2010, 07/23/2011, 04/03/2012, 03/30/2014   INFLUENZA, HIGH DOSE SEASONAL PF 04/15/2013, 03/14/2018   Influenza Inj Mdck Quad Pf 04/12/2017   Influenza, Mdck, Trivalent,PF 6+ MOS(egg free) 03/16/2016, 04/12/2017   Influenza, Quadrivalent, Recombinant, Inj, Pf 03/05/2019, 05/22/2021   Influenza,inj,Quad PF,6+ Mos 03/30/2014, 03/25/2015   PFIZER(Purple Top)SARS-COV-2 Vaccination 07/25/2019, 08/16/2019, 03/31/2020, 03/31/2021, 12/12/2021   Pneumococcal Conjugate-13 10/19/2013, 11/01/2014    Pneumococcal Polysaccharide-23 09/22/2009, 05/01/2010, 07/23/2011   Pneumococcal-Unspecified 03/02/2011   Td (Adult),5 Lf Tetanus Toxid, Preservative Free 07/23/2011   Tdap 09/22/2009, 07/23/2011, 03/12/2018   Zoster Recombinant(Shingrix) 10/08/2017, 12/09/2017   Zoster, Live 09/22/2009, 07/23/2011, 12/16/2017   Pertinent  Health Maintenance Due  Topic Date Due   DEXA SCAN  Never done   Influenza Vaccine  01/30/2024      01/22/2023    1:15 PM 04/04/2023   11:00 AM 07/10/2023    9:02 AM 10/24/2023    2:27 PM 03/04/2024    6:22 PM  Fall Risk  Falls in the past year? 0 0 0 0 0  Was there an injury with Fall? 0 0 0 0 0  Fall Risk Category Calculator 0 0 0 0 0  Patient at Risk for Falls Due to History of fall(s);Impaired balance/gait History of fall(s);Impaired balance/gait;Impaired mobility History of fall(s);Impaired balance/gait;Impaired mobility History of fall(s);Impaired balance/gait;Impaired vision History of fall(s)  Fall risk Follow up Falls evaluation completed;Education provided;Falls prevention discussed Falls evaluation completed;Education provided;Falls prevention discussed Falls evaluation completed;Education provided;Falls prevention discussed Falls evaluation completed;Education provided Falls evaluation completed   Functional Status Survey:    Vitals:   03/12/24 1025  BP: 134/75  Pulse: (!) 116  Resp: (!) 24  Temp: 98.6 F (37 C)  SpO2: 92%  Weight: 168 lb 12.8 oz (76.6 kg)  Height: 5' 1 (1.549 m)   Body mass index is 31.89 kg/m. Physical Exam Vitals reviewed.  Constitutional:      General: She is not in acute distress. HENT:     Head: Normocephalic.  Eyes:     General:        Right eye: No discharge.        Left eye: No discharge.  Cardiovascular:     Rate and Rhythm: Regular rhythm. Tachycardia present.     Pulses: Normal pulses.     Heart sounds: Normal heart sounds.  Pulmonary:     Effort: Pulmonary effort is normal. No respiratory distress.      Breath sounds: Examination of the left-middle field reveals decreased breath sounds. Examination of the right-lower field reveals decreased breath sounds. Examination of the left-lower field reveals decreased breath sounds. Decreased breath sounds present. No wheezing or rales.  Abdominal:     General: Bowel sounds are normal. There is no distension.     Palpations: Abdomen is soft.  Tenderness: There is no abdominal tenderness.  Musculoskeletal:     Cervical back: Neck supple.     Right lower leg: Edema present.     Left lower leg: Edema present.     Comments: Non pitting  Skin:    General: Skin is warm.     Capillary Refill: Capillary refill takes less than 2 seconds.  Neurological:     General: No focal deficit present.     Mental Status: She is easily aroused. She is lethargic.     Gait: Gait abnormal.  Psychiatric:        Mood and Affect: Mood normal.     Labs reviewed: Recent Labs    11/18/23 1025 11/19/23 0302 11/19/23 0904 11/20/23 0254 11/21/23 0310 12/28/23 0411 12/29/23 0247 12/30/23 0324 12/31/23 0533  NA 138   < >  --  141   < > 140 139 139 140  K 3.0*   < >  --  3.8   < > 3.4* 3.6 3.4* 3.7  CL 105   < >  --  112*   < > 107 109 109 111  CO2 25   < >  --  23   < > 25 23 23 24   GLUCOSE 148*   < >  --  130*   < > 119* 99 122* 118*  BUN 25*   < >  --  13   < > 10 9 7* <5*  CREATININE 0.57   < >  --  <0.30*   < > 0.45 0.36* 0.40* 0.34*  CALCIUM  7.4*   < >  --  7.9*   < > 7.7* 8.2* 8.1* 8.4*  MG 1.9  --   --  2.0   < > 1.9 2.5* 2.3  --   PHOS 2.6  --  2.2* 1.8*  --   --   --   --   --    < > = values in this interval not displayed.   Recent Labs    11/19/23 0302 12/27/23 1130 12/28/23 0411  AST 14* 23 20  ALT 8 8 9   ALKPHOS 29* 65 49  BILITOT 0.8 0.8 1.9*  PROT 4.5* 6.1* 4.9*  ALBUMIN 2.6* 3.5 2.9*   Recent Labs    12/18/23 0000 12/27/23 1130 12/27/23 1139 12/28/23 0411 12/28/23 0843 12/29/23 1723 12/30/23 0324 12/31/23 0533  WBC 4.3 5.8   --  8.5   < > 6.7 4.8 4.5  NEUTROABS 2,137.00 4.0  --  7.1  --   --   --   --   HGB 11.0* 9.4*   < > 8.2*   < > 7.6* 7.1* 8.5*  HCT 35* 31.2*   < > 26.7*   < > 24.8* 23.8* 27.9*  MCV  --  99.7  --  97.4   < > 98.8 101.3* 98.2  PLT 184 170  --  121*   < > 145* 135* 160   < > = values in this interval not displayed.   Lab Results  Component Value Date   TSH 2.47 10/08/2022   No results found for: HGBA1C Lab Results  Component Value Date   CHOL 125 07/14/2023   HDL 51 07/14/2023   LDLCALC 53 07/14/2023   TRIG 130 07/14/2023    Significant Diagnostic Results in last 30 days:  No results found.  Assessment/Plan 1. COVID-19 (Primary) - + test this morning - symptoms: confusion, nasal congestion, nausea, headache - lung sounds  diminished, oxygen  sat 87% on RA, HR 100-110's - concerns for pneumonia - start paxlvoid, GFR> 60 - hold simvastatin  while on paxlovid  - start doxycycline  x 7 days - start oxygen  prn, keep sats > 90% - cont daily vitamin C  and D - if no improvement in 24 hours, recommend ED evaluation  2. Tachycardia - see above - ? Dehydration, infection, urosepsis, GI bleed,poor oxygenation  - stat cbc/diff,cmp  3. Confusion - see above   4. Acute cystitis without hematuria - 09/08 urine culture indicated klebsiella pneumoniae - completed Cipro  x 5 days - encourage hydration with water    Family/ staff Communication: plan discussed with patient and nurse  Labs/tests ordered:  stat cbc/diff, cmp

## 2024-03-13 ENCOUNTER — Inpatient Hospital Stay (HOSPITAL_COMMUNITY)
Admission: EM | Admit: 2024-03-13 | Discharge: 2024-03-18 | DRG: 377 | Disposition: A | Discharge status: Skilled Nursing Facility | Source: Skilled Nursing Facility | Attending: Internal Medicine | Admitting: Internal Medicine

## 2024-03-13 ENCOUNTER — Emergency Department (HOSPITAL_COMMUNITY)

## 2024-03-13 DIAGNOSIS — Z6832 Body mass index (BMI) 32.0-32.9, adult: Secondary | ICD-10-CM | POA: Diagnosis not present

## 2024-03-13 DIAGNOSIS — R54 Age-related physical debility: Secondary | ICD-10-CM | POA: Diagnosis present

## 2024-03-13 DIAGNOSIS — Z515 Encounter for palliative care: Secondary | ICD-10-CM | POA: Diagnosis not present

## 2024-03-13 DIAGNOSIS — K8020 Calculus of gallbladder without cholecystitis without obstruction: Secondary | ICD-10-CM | POA: Diagnosis not present

## 2024-03-13 DIAGNOSIS — E66811 Obesity, class 1: Secondary | ICD-10-CM | POA: Diagnosis present

## 2024-03-13 DIAGNOSIS — F32A Depression, unspecified: Secondary | ICD-10-CM | POA: Diagnosis present

## 2024-03-13 DIAGNOSIS — K5731 Diverticulosis of large intestine without perforation or abscess with bleeding: Principal | ICD-10-CM | POA: Diagnosis present

## 2024-03-13 DIAGNOSIS — I10 Essential (primary) hypertension: Secondary | ICD-10-CM

## 2024-03-13 DIAGNOSIS — K922 Gastrointestinal hemorrhage, unspecified: Secondary | ICD-10-CM | POA: Diagnosis not present

## 2024-03-13 DIAGNOSIS — M19022 Primary osteoarthritis, left elbow: Secondary | ICD-10-CM | POA: Diagnosis present

## 2024-03-13 DIAGNOSIS — K625 Hemorrhage of anus and rectum: Principal | ICD-10-CM

## 2024-03-13 DIAGNOSIS — Z9841 Cataract extraction status, right eye: Secondary | ICD-10-CM

## 2024-03-13 DIAGNOSIS — D62 Acute posthemorrhagic anemia: Secondary | ICD-10-CM | POA: Diagnosis not present

## 2024-03-13 DIAGNOSIS — J9611 Chronic respiratory failure with hypoxia: Secondary | ICD-10-CM | POA: Diagnosis not present

## 2024-03-13 DIAGNOSIS — M8580 Other specified disorders of bone density and structure, unspecified site: Secondary | ICD-10-CM | POA: Diagnosis not present

## 2024-03-13 DIAGNOSIS — G2581 Restless legs syndrome: Secondary | ICD-10-CM | POA: Diagnosis present

## 2024-03-13 DIAGNOSIS — Z7401 Bed confinement status: Secondary | ICD-10-CM | POA: Diagnosis not present

## 2024-03-13 DIAGNOSIS — J449 Chronic obstructive pulmonary disease, unspecified: Secondary | ICD-10-CM | POA: Diagnosis present

## 2024-03-13 DIAGNOSIS — F419 Anxiety disorder, unspecified: Secondary | ICD-10-CM | POA: Diagnosis not present

## 2024-03-13 DIAGNOSIS — Z886 Allergy status to analgesic agent status: Secondary | ICD-10-CM

## 2024-03-13 DIAGNOSIS — M810 Age-related osteoporosis without current pathological fracture: Secondary | ICD-10-CM | POA: Diagnosis present

## 2024-03-13 DIAGNOSIS — D649 Anemia, unspecified: Secondary | ICD-10-CM | POA: Diagnosis not present

## 2024-03-13 DIAGNOSIS — D696 Thrombocytopenia, unspecified: Secondary | ICD-10-CM | POA: Diagnosis not present

## 2024-03-13 DIAGNOSIS — H9190 Unspecified hearing loss, unspecified ear: Secondary | ICD-10-CM | POA: Diagnosis present

## 2024-03-13 DIAGNOSIS — R1011 Right upper quadrant pain: Secondary | ICD-10-CM | POA: Diagnosis not present

## 2024-03-13 DIAGNOSIS — Z743 Need for continuous supervision: Secondary | ICD-10-CM | POA: Diagnosis not present

## 2024-03-13 DIAGNOSIS — Z7983 Long term (current) use of bisphosphonates: Secondary | ICD-10-CM

## 2024-03-13 DIAGNOSIS — Z6831 Body mass index (BMI) 31.0-31.9, adult: Secondary | ICD-10-CM | POA: Diagnosis not present

## 2024-03-13 DIAGNOSIS — M19011 Primary osteoarthritis, right shoulder: Secondary | ICD-10-CM | POA: Diagnosis present

## 2024-03-13 DIAGNOSIS — E785 Hyperlipidemia, unspecified: Secondary | ICD-10-CM | POA: Diagnosis not present

## 2024-03-13 DIAGNOSIS — I959 Hypotension, unspecified: Secondary | ICD-10-CM | POA: Diagnosis not present

## 2024-03-13 DIAGNOSIS — I110 Hypertensive heart disease with heart failure: Secondary | ICD-10-CM | POA: Diagnosis present

## 2024-03-13 DIAGNOSIS — I700 Atherosclerosis of aorta: Secondary | ICD-10-CM | POA: Diagnosis not present

## 2024-03-13 DIAGNOSIS — Z23 Encounter for immunization: Secondary | ICD-10-CM

## 2024-03-13 DIAGNOSIS — D509 Iron deficiency anemia, unspecified: Secondary | ICD-10-CM | POA: Diagnosis not present

## 2024-03-13 DIAGNOSIS — Z85828 Personal history of other malignant neoplasm of skin: Secondary | ICD-10-CM

## 2024-03-13 DIAGNOSIS — H3530 Unspecified macular degeneration: Secondary | ICD-10-CM | POA: Diagnosis present

## 2024-03-13 DIAGNOSIS — D61818 Other pancytopenia: Secondary | ICD-10-CM | POA: Diagnosis not present

## 2024-03-13 DIAGNOSIS — Z961 Presence of intraocular lens: Secondary | ICD-10-CM | POA: Diagnosis present

## 2024-03-13 DIAGNOSIS — U071 COVID-19: Secondary | ICD-10-CM | POA: Diagnosis not present

## 2024-03-13 DIAGNOSIS — Z66 Do not resuscitate: Secondary | ICD-10-CM | POA: Diagnosis not present

## 2024-03-13 DIAGNOSIS — K5791 Diverticulosis of intestine, part unspecified, without perforation or abscess with bleeding: Secondary | ICD-10-CM

## 2024-03-13 DIAGNOSIS — I701 Atherosclerosis of renal artery: Secondary | ICD-10-CM | POA: Diagnosis not present

## 2024-03-13 DIAGNOSIS — K219 Gastro-esophageal reflux disease without esophagitis: Secondary | ICD-10-CM | POA: Diagnosis present

## 2024-03-13 DIAGNOSIS — H409 Unspecified glaucoma: Secondary | ICD-10-CM | POA: Diagnosis present

## 2024-03-13 DIAGNOSIS — M19012 Primary osteoarthritis, left shoulder: Secondary | ICD-10-CM | POA: Diagnosis present

## 2024-03-13 DIAGNOSIS — I5032 Chronic diastolic (congestive) heart failure: Secondary | ICD-10-CM | POA: Diagnosis present

## 2024-03-13 DIAGNOSIS — R58 Hemorrhage, not elsewhere classified: Secondary | ICD-10-CM | POA: Diagnosis not present

## 2024-03-13 DIAGNOSIS — Z9842 Cataract extraction status, left eye: Secondary | ICD-10-CM

## 2024-03-13 DIAGNOSIS — Z885 Allergy status to narcotic agent status: Secondary | ICD-10-CM

## 2024-03-13 DIAGNOSIS — Z8052 Family history of malignant neoplasm of bladder: Secondary | ICD-10-CM

## 2024-03-13 DIAGNOSIS — R1084 Generalized abdominal pain: Secondary | ICD-10-CM | POA: Diagnosis not present

## 2024-03-13 DIAGNOSIS — Z79899 Other long term (current) drug therapy: Secondary | ICD-10-CM

## 2024-03-13 DIAGNOSIS — Z8249 Family history of ischemic heart disease and other diseases of the circulatory system: Secondary | ICD-10-CM

## 2024-03-13 DIAGNOSIS — M479 Spondylosis, unspecified: Secondary | ICD-10-CM | POA: Diagnosis present

## 2024-03-13 DIAGNOSIS — M19021 Primary osteoarthritis, right elbow: Secondary | ICD-10-CM | POA: Diagnosis present

## 2024-03-13 DIAGNOSIS — Z96653 Presence of artificial knee joint, bilateral: Secondary | ICD-10-CM | POA: Diagnosis present

## 2024-03-13 DIAGNOSIS — Z87891 Personal history of nicotine dependence: Secondary | ICD-10-CM

## 2024-03-13 DIAGNOSIS — Z9981 Dependence on supplemental oxygen: Secondary | ICD-10-CM

## 2024-03-13 LAB — CBC WITH DIFFERENTIAL/PLATELET
Abs Immature Granulocytes: 0.02 K/uL (ref 0.00–0.07)
Basophils Absolute: 0 K/uL (ref 0.0–0.1)
Basophils Relative: 0 %
Eosinophils Absolute: 0 K/uL (ref 0.0–0.5)
Eosinophils Relative: 1 %
HCT: 33.3 % — ABNORMAL LOW (ref 36.0–46.0)
Hemoglobin: 10.3 g/dL — ABNORMAL LOW (ref 12.0–15.0)
Immature Granulocytes: 0 %
Lymphocytes Relative: 13 %
Lymphs Abs: 0.7 K/uL (ref 0.7–4.0)
MCH: 30.5 pg (ref 26.0–34.0)
MCHC: 30.9 g/dL (ref 30.0–36.0)
MCV: 98.5 fL (ref 80.0–100.0)
Monocytes Absolute: 0.8 K/uL (ref 0.1–1.0)
Monocytes Relative: 16 %
Neutro Abs: 3.8 K/uL (ref 1.7–7.7)
Neutrophils Relative %: 70 %
Platelets: 143 K/uL — ABNORMAL LOW (ref 150–400)
RBC: 3.38 MIL/uL — ABNORMAL LOW (ref 3.87–5.11)
RDW: 15.6 % — ABNORMAL HIGH (ref 11.5–15.5)
WBC: 5.4 K/uL (ref 4.0–10.5)
nRBC: 0 % (ref 0.0–0.2)

## 2024-03-13 LAB — TYPE AND SCREEN

## 2024-03-13 LAB — COMPREHENSIVE METABOLIC PANEL WITH GFR
ALT: 8 U/L (ref 0–44)
AST: 23 U/L (ref 15–41)
Albumin: 3.8 g/dL (ref 3.5–5.0)
Alkaline Phosphatase: 51 U/L (ref 38–126)
Anion gap: 12 (ref 5–15)
BUN: 13 mg/dL (ref 8–23)
CO2: 21 mmol/L — ABNORMAL LOW (ref 22–32)
Calcium: 8.8 mg/dL — ABNORMAL LOW (ref 8.9–10.3)
Chloride: 104 mmol/L (ref 98–111)
Creatinine, Ser: 0.61 mg/dL (ref 0.44–1.00)
GFR, Estimated: >60 mL/min (ref >60)
Glucose, Bld: 155 mg/dL — ABNORMAL HIGH (ref 70–99)
Potassium: 4.2 mmol/L (ref 3.5–5.1)
Sodium: 137 mmol/L (ref 135–145)
Total Bilirubin: 0.3 mg/dL (ref 0.0–1.2)
Total Protein: 6.4 g/dL — ABNORMAL LOW (ref 6.5–8.1)

## 2024-03-13 MED ORDER — HYDRALAZINE HCL 20 MG/ML IJ SOLN: 10.0000 mg | INTRAMUSCULAR | Status: DC | PRN

## 2024-03-13 MED ORDER — MIDAZOLAM HCL 2 MG/2ML IJ SOLN
INTRAMUSCULAR | Status: AC
  Filled 2024-03-13: qty 2

## 2024-03-13 MED ORDER — DOCUSATE SODIUM 100 MG PO CAPS: 100.0000 mg | ORAL_CAPSULE | Freq: Two times a day (BID) | ORAL | Status: DC | PRN

## 2024-03-13 MED ORDER — FENTANYL CITRATE (PF) 100 MCG/2ML IJ SOLN
INTRAMUSCULAR | Status: AC
  Filled 2024-03-13: qty 2

## 2024-03-13 MED ORDER — CHLORHEXIDINE GLUCONATE CLOTH 2 % EX PADS
6.0000 | MEDICATED_PAD | Freq: Every day | CUTANEOUS | Status: DC
  Administered 2024-03-14 – 2024-03-18 (×5): 6 via TOPICAL

## 2024-03-13 MED ORDER — PANTOPRAZOLE SODIUM 40 MG PO TBEC: 40.0000 mg | DELAYED_RELEASE_TABLET | Freq: Every day | ORAL | Status: DC

## 2024-03-13 MED ORDER — POLYETHYLENE GLYCOL 3350 17 G PO PACK: 17.0000 g | PACK | Freq: Every day | ORAL | Status: DC | PRN

## 2024-03-13 MED ORDER — IOHEXOL 350 MG/ML SOLN
100.0000 mL | Freq: Once | INTRAVENOUS | Status: AC | PRN
  Administered 2024-03-13: 100 mL via INTRAVENOUS

## 2024-03-13 MED ORDER — SODIUM CHLORIDE 0.9% IV SOLUTION: Freq: Once | INTRAVENOUS | Status: AC

## 2024-03-13 MED ORDER — LIDOCAINE HCL 1 % IJ SOLN
INTRAMUSCULAR | Status: AC
Start: 2024-03-13 — End: 2024-03-13
  Filled 2024-03-13: qty 20

## 2024-03-13 MED ORDER — IPRATROPIUM-ALBUTEROL 0.5-2.5 (3) MG/3ML IN SOLN: 3.0000 mL | RESPIRATORY_TRACT | Status: DC | PRN

## 2024-03-13 NOTE — H&P (Signed)
 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 Endorses RUQ pain rated 3/10. She does not take anti-PLT meds or AC. Tested COVID+ yesterday, but asymptomatic.  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 IR was consulted and planned for procedure tonight. ICU admission. 2 units PRBC Tx was ordered stat in ED   Interim History / Subjective:    Objective    Blood pressure 113/74, pulse 99, temperature 98.9 F (37.2 C), temperature source Oral, resp. rate 14, SpO2 100%.       No intake or output data in the 24 hours ending 03/13/24 2306 There were no vitals filed for this visit.  Examination: General: alert, oriented x4, and comfortable. On 1 L [place]. SpO2 99%  HENT: PERL, normal pharynx and oral mucosa. No LNE or thyromegaly. No JVD Lungs: symmetrical air entry bilaterally. No crackles or wheezing Cardiovascular: NL S1/S2. No m/g/r Abdomen: no distension or tenderness Extremities: trace edema. Symmetrical  Neuro: nonfocal   Resolved problem list   Assessment and Plan  LGIB from the transverse colon, diverticulosis without diverticulitis.  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 @ 1 L [place] -Duoneb PRN  Depression, anxiety, RLS -Resume home meds in am if stable   HTN -Hydralazine  PRN   Dyslipidemia -Hold statin for now  GERD, Fe def anemia, UGIB Sep 2024 due to gastritis -PPI -Monitor CBC  DNR/DNI: noted from medical chart provided from [facility]   CBC: Recent Labs  Lab 03/13/24 2052  WBC 5.4  NEUTROABS 3.8  HGB 10.3*  HCT 33.3*  MCV 98.5  PLT 143*    Basic Metabolic Panel: Recent Labs  Lab 03/13/24 2052  NA 137  K 4.2  CL 104  CO2 21*  GLUCOSE 155*  BUN 13  CREATININE 0.61  CALCIUM  8.8*   GFR: Estimated Creatinine Clearance: 48.1 mL/min (by C-G formula based on SCr of 0.61 mg/dL). Recent Labs  Lab 03/13/24 2052  WBC 5.4    Liver Function Tests: Recent Labs  Lab 03/13/24 2052  AST 23  ALT 8  ALKPHOS 51  BILITOT 0.3  PROT 6.4*  ALBUMIN 3.8   No results for input(s): LIPASE, AMYLASE in the last 168 hours. No results for input(s): AMMONIA in the last 168 hours.  ABG    Component Value Date/Time   PHART 7.341 (L) 05/04/2021 2049   PCO2ART 61.4 (H) 05/04/2021 2049   PO2ART 73 (L) 05/04/2021 2049   HCO3 32.9 (H) 05/04/2021 2049   TCO2 26 12/27/2023 1139   O2SAT 92.0 05/04/2021 2049     Coagulation Profile: No results for input(s):  INR, PROTIME in the last 168 hours.  Cardiac Enzymes: No results for input(s): CKTOTAL, CKMB, CKMBINDEX, TROPONINI in the last 168 hours.  HbA1C: No results found for: HGBA1C  CBG: No results for input(s): GLUCAP in the last 168 hours.  Review of Systems:   HOH  Past Medical History:  She,  has a past medical history of Anxiety, Arthritis, BPPV (benign paroxysmal positional vertigo), Depression, GERD (gastroesophageal reflux disease), Glaucoma, both eyes, Headache, History of blood transfusion (~ 2008), Hyperlipemia, Lower GI bleeding (~ 2008; 09/05/2017), Macular degeneration, bilateral, Osteopenia,  Seasonal allergies, Skin cancer, basal cell (2001), Sleeping excessive, and Tinnitus of both ears.   Surgical History:   Past Surgical History:  Procedure Laterality Date   BALLOON DILATION N/A 05/08/2021   Procedure: BALLOON DILATION;  Surgeon: Saintclair Jasper, MD;  Location: [facility] ENDOSCOPY;  Service: Gastroenterology;  Laterality: N/A;   BASAL CELL CARCINOMA EXCISION  2001   off my nose, left side   BIOPSY  05/08/2021   Procedure: BIOPSY;  Surgeon: Saintclair Jasper, MD;  Location: [facility] ENDOSCOPY;  Service: Gastroenterology;;   BIOPSY  04/25/2023   Procedure: BIOPSY;  Surgeon: Dianna Specking, MD;  Location: [facility] ENDOSCOPY;  Service: Gastroenterology;;   BLEPHAROPLASTY Bilateral    BONE BIOPSY  10/26/2023   Procedure: BIOPSY, GI;  Surgeon: Elicia Claw, MD;  Location: [facility] ENDOSCOPY;  Service: Gastroenterology;;   CATARACT EXTRACTION W/ INTRAOCULAR LENS  IMPLANT, BILATERAL Bilateral 1990's   COLONOSCOPY N/A 10/26/2023   Procedure: COLONOSCOPY;  Surgeon: Elicia Claw, MD;  Location: [facility] ENDOSCOPY;  Service: Gastroenterology;  Laterality: N/A;   COLONOSCOPY WITH PROPOFOL  N/A 05/04/2022   Procedure: COLONOSCOPY WITH PROPOFOL ;  Surgeon: Saintclair Jasper, MD;  Location: [facility] ENDOSCOPY;  Service: Gastroenterology;  Laterality: N/A;   COLONOSCOPY WITH PROPOFOL  N/A 04/25/2023   Procedure: COLONOSCOPY WITH PROPOFOL ;  Surgeon: Dianna Specking, MD;  Location: [facility] ENDOSCOPY;  Service: Gastroenterology;  Laterality: N/A;   ENTEROSCOPY N/A 11/20/2023   Procedure: ENTEROSCOPY;  Surgeon: Dianna Specking, MD;  Location: [facility] ENDOSCOPY;  Service: Gastroenterology;  Laterality: N/A;   ESOPHAGOGASTRODUODENOSCOPY N/A 10/26/2023   Procedure: EGD (ESOPHAGOGASTRODUODENOSCOPY);  Surgeon: Elicia Claw, MD;  Location: [facility] ENDOSCOPY;  Service: Gastroenterology;  Laterality: N/A;   ESOPHAGOGASTRODUODENOSCOPY N/A 11/20/2023   Procedure: EGD (ESOPHAGOGASTRODUODENOSCOPY);  Surgeon: Dianna Specking, MD;  Location: [facility] ENDOSCOPY;   Service: Gastroenterology;  Laterality: N/A;   ESOPHAGOGASTRODUODENOSCOPY (EGD) WITH PROPOFOL  N/A 05/08/2021   Procedure: ESOPHAGOGASTRODUODENOSCOPY (EGD) WITH PROPOFOL ;  Surgeon: Saintclair Jasper, MD;  Location: [facility] ENDOSCOPY;  Service: Gastroenterology;  Laterality: N/A;   ESOPHAGOGASTRODUODENOSCOPY (EGD) WITH PROPOFOL  N/A 01/11/2022   Procedure: ESOPHAGOGASTRODUODENOSCOPY (EGD) WITH PROPOFOL ;  Surgeon: Rosalie Kitchens, MD;  Location: [facility] ENDOSCOPY;  Service: Gastroenterology;  Laterality: N/A;   ESOPHAGOGASTRODUODENOSCOPY (EGD) WITH PROPOFOL  N/A 01/06/2023   Procedure: ESOPHAGOGASTRODUODENOSCOPY (EGD) WITH PROPOFOL ;  Surgeon: Rosalie Kitchens, MD;  Location: [facility] ENDOSCOPY;  Service: Gastroenterology;  Laterality: N/A;   ESOPHAGOGASTRODUODENOSCOPY (EGD) WITH PROPOFOL  N/A 04/21/2023   Procedure: ESOPHAGOGASTRODUODENOSCOPY (EGD) WITH PROPOFOL ;  Surgeon: Dianna Specking, MD;  Location: [facility] ENDOSCOPY;  Service: Gastroenterology;  Laterality: N/A;   EYE SURGERY Bilateral    to improve vision after cataract OR   GIVENS CAPSULE STUDY N/A 04/21/2023   Procedure: GIVENS CAPSULE STUDY;  Surgeon: Dianna Specking, MD;  Location: [facility] ENDOSCOPY;  Service: Gastroenterology;  Laterality: N/A;   GIVENS CAPSULE STUDY N/A 11/18/2023   Procedure: IMAGING PROCEDURE, GI TRACT, INTRALUMINAL, VIA CAPSULE;  Surgeon: Dianna Specking, MD;  Location: [facility] ENDOSCOPY;  Service: Gastroenterology;  Laterality: N/A;   HEMOSTASIS CLIP PLACEMENT  10/26/2023   Procedure: CONTROL OF HEMORRHAGE, GI TRACT, ENDOSCOPIC, BY CLIPPING OR OVERSEWING;  Surgeon: Elicia Claw, MD;  Location: [facility] ENDOSCOPY;  Service: Gastroenterology;;   IR ANGIOGRAM FOLLOW UP STUDY  12/19/2020   IR ANGIOGRAM SELECTIVE EACH ADDITIONAL VESSEL  12/19/2020   IR ANGIOGRAM SELECTIVE EACH ADDITIONAL VESSEL  12/19/2020   IR ANGIOGRAM SELECTIVE EACH ADDITIONAL VESSEL  12/28/2023   IR ANGIOGRAM VISCERAL SELECTIVE  12/19/2020   IR ANGIOGRAM VISCERAL SELECTIVE  12/28/2023   IR EMBO ART   VEN HEMORR LYMPH EXTRAV  INC GUIDE ROADMAPPING  12/19/2020   IR EMBO ART  VEN HEMORR LYMPH EXTRAV  INC GUIDE ROADMAPPING  12/28/2023   IR US  GUIDE VASC ACCESS RIGHT  12/19/2020   IR US  GUIDE VASC ACCESS RIGHT  12/28/2023   JOINT REPLACEMENT     POLYPECTOMY  05/04/2022   Procedure: POLYPECTOMY;  Surgeon: Saintclair Jasper, MD;  Location: [facility] ENDOSCOPY;  Service: Gastroenterology;;   POLYPECTOMY  10/26/2023   Procedure: POLYPECTOMY, INTESTINE;  Surgeon: Elicia Claw, MD;  Location: [facility] ENDOSCOPY;  Service: Gastroenterology;;   STAPEDES SURGERY Left    scraped stapedes because it was sticking when it wasn't suppose to   TONSILLECTOMY AND ADENOIDECTOMY  1946   TOTAL KNEE ARTHROPLASTY Left ~ 2008   TOTAL KNEE ARTHROPLASTY  12/20/2011   Procedure: TOTAL KNEE ARTHROPLASTY;  Surgeon: Elspeth JONELLE Her, MD;  Location: [facility] OR;  Service: Orthopedics;  Laterality: Right;  Right Total Knee Arthroplasty   TUBAL LIGATION  1980's     Social History:   reports that she quit smoking about 31 years ago. Her smoking use included cigarettes. She started smoking about 68 years ago. She has a 27.8 pack-year smoking history. She has never been exposed to tobacco smoke. She has never used smokeless tobacco. She reports current alcohol  use of about 7.0 standard drinks of alcohol  per week. She reports that she does not use drugs.   Family History:  Her family history includes Bladder Cancer in her father; Heart attack in her mother; Heart disease in her father; Heart failure in her father; Hypertension in her sister; Supraventricular tachycardia in her sister.   Allergies Allergies  Allergen Reactions   Morphine And Codeine Itching   Aspirin Other (See Comments)    Unknown reaction   Duloxetine  Hcl Other (See Comments)    Unknown reaction   Tymlos [Abaloparatide] Other (See Comments)    Unknown reaction   Ventolin  [Albuterol ] Other (See Comments)    rapid heart beat   Cefadroxil Hives    Patient can take amoxicillin   and cipro      Home Medications  Prior to Admission medications   Medication Sig Start Date End Date Taking? Authorizing Provider  Acetaminophen  (TYLENOL  PO) Take 500 mg by mouth daily as needed.    [provider]  acetaminophen  (TYLENOL ) 650 MG CR tablet Take 1,300 mg by mouth 2 (two) times daily.    [provider]  albuterol  (VENTOLIN  HFA) 108 (90 Base) MCG/ACT inhaler Inhale 2 puffs into the lungs every 4 (four) hours as needed for wheezing or shortness of breath.    [provider]  alendronate (FOSAMAX) 70 MG tablet Take 70 mg by mouth every Sunday. Take with a full glass of water on an empty stomach.    [provider]  Ascorbic Acid  (VITAMIN C ) 1000 MG tablet Take 1,000 mg by mouth every morning.    [provider]  cetirizine  (ZYRTEC ) 5 MG chewable tablet Chew 5 mg by mouth  daily as needed for allergies.    [provider]  Cholecalciferol  (VITAMIN D3) 50 MCG (2000 UT) TABS Take 2,000 Units by mouth every morning.    [provider]  cyanocobalamin  (VITAMIN B12) 1000 MCG tablet Take 1,000 mcg by mouth daily.    [provider]  Dorzolamide  HCl-Timolol  Mal PF 2-0.5 % SOLN Place 1 drop into both eyes in the morning and at bedtime.    [provider]  doxycycline  (VIBRA -TABS) 100 MG tablet Take 1 tablet (100 mg total) by mouth 2 (two) times daily for 7 days. 03/12/24 03/19/24  [name], [name], NP  ferrous gluconate  (FERGON) 240 (27 FE) MG tablet Take 240 mg by mouth every morning. Take without food    [provider]  fluticasone  (FLONASE ) 50 MCG/ACT nasal spray Place 1 spray into both nostrils as needed for allergies or rhinitis.    [provider]  guaifenesin  (ROBITUSSIN) 100 MG/5ML syrup Take 10 mLs by mouth every 4 (four) hours as needed for cough.    [provider]  latanoprost  (XALATAN ) 0.005 % ophthalmic solution Place 1 drop into both eyes at bedtime.    [provider]   loperamide  (IMODIUM  A-D) 2 MG tablet Take 4 mg by mouth as needed for diarrhea or loose stools.    [provider]  Multiple Vitamins-Minerals (PRESERVISION AREDS 2) CAPS Take 1 capsule by mouth 2 (two) times daily.    [provider]  Netarsudil  Dimesylate (RHOPRESSA ) 0.02 % SOLN Place 1 drop into both eyes at bedtime.    [provider]  nirmatrelvir /ritonavir  (PAXLOVID ) 20 x 150 MG & 10 x 100MG  TABS Take 3 tablets by mouth 2 (two) times daily for 5 days. (Take nirmatrelvir  150 mg two tablets twice daily for 5 days and ritonavir  100 mg one tablet twice daily for 5 days) Patient GFR is 60 03/12/24 03/17/24  [name], [name], NP  oxybutynin  (DITROPAN -XL) 10 MG 24 hr tablet Take 10 mg by mouth at bedtime.    [provider]  pantoprazole  (PROTONIX ) 40 MG tablet Take 1 tablet (40 mg total) by mouth 2 (two) times daily before a meal. 11/24/23   Amin, Ankit C, MD  Polyethylene Glycol 3350  (MIRALAX  PO) Take 8.6 g by mouth daily as needed.    [provider]  potassium chloride  SA (KLOR-CON  M) 20 MEQ tablet Take 20 mEq by mouth daily.    [provider]  Probiotic Product (ALIGN) 4 MG CAPS Take 4 mg by mouth at bedtime.    [provider]  rOPINIRole  (REQUIP ) 0.5 MG tablet Take 1 tablet (0.5 mg total) by mouth daily as needed. 04/30/23   [name], [name], NP  senna (SENOKOT) 8.6 MG TABS tablet Take 2 tablets by mouth at bedtime.    [provider]  sertraline  (ZOLOFT ) 50 MG tablet Take 1.5 tablets (75 mg total) by mouth daily. 01/07/23   Samtani, Jai-Gurmukh, MD  simvastatin  (ZOCOR ) 20 MG tablet Take 20 mg by mouth every evening. HOLD WHILE ON PAXLOVID     [provider]  Sodium Fluoride  (PREVIDENT 5000 BOOSTER PLUS) 1.1 % PSTE Place 1 Application onto teeth See admin instructions. Brush on teeth with a toothbrush after evening mouth care. Spit out excess and do not rinse.    [provider]  torsemide  (DEMADEX ) 20 MG tablet Take  20 mg by mouth every morning.    [provider]  traMADol  HCl 25 MG TABS Take 25 mg by mouth 2 (two)  times daily. 03/02/24   Medina-Vargas, Monina C, NP  umeclidinium-vilanterol (ANORO ELLIPTA ) 62.5-25 MCG/ACT AEPB Inhale 1 puff into the lungs every morning.    [provider]  zinc oxide 20 % ointment Apply 1 Application topically as needed for irritation.    [provider]     Critical care time: 42 min    Mancel Ply, MD [facility] Pager: see AMION

## 2024-03-13 NOTE — ED Provider Notes (Signed)
 [place] EMERGENCY DEPARTMENT AT [facility] Provider Note  CSN: [id number] Arrival date & time: 03/13/24 2009  Chief Complaint(s) Rectal Bleeding  HPI [name] is a 83 y.o. female who is here today with bleeding from her rectum.  Patient has a prior history of diverticular bleeding, has required embolization previously.  She states that this is the most bleeding I have had, and I have had some big ones!  No abdominal pain.   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, left side   Sleeping excessive    Tinnitus of both ears    Patient Active Problem List   Diagnosis Date Noted   Hypokalemia 12/28/2023   Acute on chronic blood loss anemia 12/27/2023   Acute lower GI bleeding 12/27/2023   Closed compression fracture of body of L1 vertebra (HCC) 12/09/2023   Urinary incontinence 12/05/2023   Acute GI bleeding 11/18/2023   Ischemic colitis (HCC) 10/27/2023   Acute upper GI bleed 10/25/2023   Edema of right lower extremity 10/25/2023   Chronic heart failure with preserved ejection fraction (HFpEF) (HCC) 10/25/2023   UGI bleed 04/19/2023   Recurrent depression (HCC) 04/13/2023   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   History of GI diverticular bleed 03/28/2023   GAD (generalized anxiety disorder) 03/28/2023   Diastolic heart failure (HCC) 03/19/2023   Class 1 obesity 03/19/2023   Thoracic  aorta atherosclerosis (HCC) 03/19/2023   Melena 01/05/2023   Hypotension 05/02/2022   UTI (urinary tract infection) 02/05/2022   Restless leg syndrome 02/05/2022   Senile osteoporosis 02/05/2022   Hypertension 02/05/2022   Pulmonary nodule 1 cm or greater in diameter 02/05/2022   GERD (gastroesophageal reflux disease) 02/05/2022   Upper GI bleed 01/07/2022   Iron  deficiency anemia 05/02/2021   Chronic anemia    DOE (dyspnea on exertion) 11/18/2017   COPD (chronic obstructive pulmonary disease) (HCC) 11/18/2017   Diverticulosis of intestine with bleeding 09/05/2017   Acute respiratory failure with hypoxia (HCC) 08/07/2017   Depression with anxiety 08/07/2017   Hyperlipidemia 08/07/2017   Macular degeneration 08/07/2017   Glaucoma 08/07/2017   Weakness 08/07/2017   Dehydration 08/07/2017   Lower GI bleeding 10/14/2016   Diverticulosis of colon 10/14/2016   Anxiety 10/14/2016   Lower GI bleed 10/14/2016   GI bleed    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 Authorizing Provider  Acetaminophen  (TYLENOL  PO) Take 500 mg by mouth daily as needed.    [provider]  acetaminophen  (TYLENOL ) 650 MG CR tablet Take 1,300 mg by mouth 2 (two) times daily.    [provider]  albuterol  (VENTOLIN  HFA) 108 (90 Base) MCG/ACT  inhaler Inhale 2 puffs into the lungs every 4 (four) hours as needed for wheezing or shortness of breath.    [provider]  alendronate (FOSAMAX) 70 MG tablet Take 70 mg by mouth every Sunday. Take with a full glass of water on an empty stomach.    [provider]  Ascorbic Acid  (VITAMIN C ) 1000 MG tablet Take 1,000 mg by mouth every morning.    [provider]  cetirizine  (ZYRTEC ) 5 MG chewable tablet Chew 5 mg by mouth daily  as needed for allergies.    [provider]  Cholecalciferol  (VITAMIN D3) 50 MCG (2000 UT) TABS Take 2,000 Units by mouth every morning.    [provider]  cyanocobalamin  (VITAMIN B12) 1000 MCG tablet Take 1,000 mcg by mouth daily.    [provider]  Dorzolamide  HCl-Timolol  Mal PF 2-0.5 % SOLN Place 1 drop into both eyes in the morning and at bedtime.    [provider]  doxycycline  (VIBRA -TABS) 100 MG tablet Take 1 tablet (100 mg total) by mouth 2 (two) times daily for 7 days. 03/12/24 03/19/24  [name], [name], NP  ferrous gluconate  (FERGON) 240 (27 FE) MG tablet Take 240 mg by mouth every morning. Take without food    [provider]  fluticasone  (FLONASE ) 50 MCG/ACT nasal spray Place 1 spray into both nostrils as needed for allergies or rhinitis.    [provider]  guaifenesin  (ROBITUSSIN) 100 MG/5ML syrup Take 10 mLs by mouth every 4 (four) hours as needed for cough.    [provider]  latanoprost  (XALATAN ) 0.005 % ophthalmic solution Place 1 drop into both eyes at bedtime.    [provider]  loperamide  (IMODIUM  A-D) 2 MG tablet Take 4 mg by mouth as needed for diarrhea or loose stools.    [provider]  Multiple Vitamins-Minerals (PRESERVISION AREDS 2) CAPS Take 1 capsule by mouth 2 (two) times daily.    [provider]  Netarsudil  Dimesylate (RHOPRESSA ) 0.02 % SOLN Place 1 drop into both eyes at bedtime.    [provider]  nirmatrelvir /ritonavir  (PAXLOVID ) 20 x 150 MG & 10 x 100MG  TABS Take 3 tablets by mouth 2 (two) times daily for 5 days. (Take nirmatrelvir  150 mg two tablets twice daily for 5 days and ritonavir  100 mg one tablet twice daily for 5 days) Patient GFR is 60 03/12/24 03/17/24  [name], [name], NP  oxybutynin  (DITROPAN -XL) 10 MG 24 hr tablet Take 10 mg by mouth at bedtime.    [provider]  pantoprazole  (PROTONIX ) 40 MG tablet Take 1 tablet (40 mg total) by mouth 2 (two)  times daily before a meal. 11/24/23   Amin, Ankit C, MD  Polyethylene Glycol 3350  (MIRALAX  PO) Take 8.6 g by mouth daily as needed.    [provider]  potassium chloride  SA (KLOR-CON  M) 20 MEQ tablet Take 20 mEq by mouth daily.    [provider]  Probiotic Product (ALIGN) 4 MG CAPS Take 4 mg by mouth at bedtime.    [provider]  rOPINIRole  (REQUIP ) 0.5 MG tablet Take 1 tablet (0.5 mg total) by mouth daily as needed. 04/30/23   [name], [name], NP  senna (SENOKOT) 8.6 MG TABS tablet Take 2 tablets by mouth at bedtime.    [provider]  sertraline  (ZOLOFT ) 50 MG tablet Take 1.5 tablets (75 mg total) by mouth daily. 01/07/23   Samtani, Jai-Gurmukh, MD  simvastatin  (ZOCOR ) 20 MG tablet Take 20  mg by mouth every evening. HOLD WHILE ON PAXLOVID     [provider]  Sodium Fluoride  (PREVIDENT 5000 BOOSTER PLUS) 1.1 % PSTE Place 1 Application onto teeth See admin instructions. Brush on teeth with a toothbrush after evening mouth care. Spit out excess and do not rinse.    [provider]  torsemide  (DEMADEX ) 20 MG tablet Take 20 mg by mouth every morning.    [provider]  traMADol  HCl 25 MG TABS Take 25 mg by mouth 2 (two) times daily. 03/02/24   Medina-Vargas, Monina C, NP  umeclidinium-vilanterol (ANORO ELLIPTA ) 62.5-25 MCG/ACT AEPB Inhale 1 puff into the lungs every morning.    [provider]  zinc oxide 20 % ointment Apply 1 Application topically as needed for irritation.    [provider]                                                                                                                                    Past Surgical History Past Surgical History:  Procedure Laterality Date   BALLOON DILATION N/A 05/08/2021   Procedure: BALLOON DILATION;  Surgeon: Saintclair Jasper, MD;  Location: [facility] ENDOSCOPY;  Service: Gastroenterology;  Laterality: N/A;   BASAL CELL CARCINOMA EXCISION  2001   off my nose, left side    BIOPSY  05/08/2021   Procedure: BIOPSY;  Surgeon: Saintclair Jasper, MD;  Location: [facility] ENDOSCOPY;  Service: Gastroenterology;;   BIOPSY  04/25/2023   Procedure: BIOPSY;  Surgeon: Dianna Specking, MD;  Location: [facility] ENDOSCOPY;  Service: Gastroenterology;;   BLEPHAROPLASTY Bilateral    BONE BIOPSY  10/26/2023   Procedure: BIOPSY, GI;  Surgeon: Elicia Claw, MD;  Location: [facility] ENDOSCOPY;  Service: Gastroenterology;;   CATARACT EXTRACTION W/ INTRAOCULAR LENS  IMPLANT, BILATERAL Bilateral 1990's   COLONOSCOPY N/A 10/26/2023   Procedure: COLONOSCOPY;  Surgeon: Elicia Claw, MD;  Location: [facility] ENDOSCOPY;  Service: Gastroenterology;  Laterality: N/A;   COLONOSCOPY WITH PROPOFOL  N/A 05/04/2022   Procedure: COLONOSCOPY WITH PROPOFOL ;  Surgeon: Saintclair Jasper, MD;  Location: [facility] ENDOSCOPY;  Service: Gastroenterology;  Laterality: N/A;   COLONOSCOPY WITH PROPOFOL  N/A 04/25/2023   Procedure: COLONOSCOPY WITH PROPOFOL ;  Surgeon: Dianna Specking, MD;  Location: [facility] ENDOSCOPY;  Service: Gastroenterology;  Laterality: N/A;   ENTEROSCOPY N/A 11/20/2023   Procedure: ENTEROSCOPY;  Surgeon: Dianna Specking, MD;  Location: [facility] ENDOSCOPY;  Service: Gastroenterology;  Laterality: N/A;   ESOPHAGOGASTRODUODENOSCOPY N/A 10/26/2023   Procedure: EGD (ESOPHAGOGASTRODUODENOSCOPY);  Surgeon: Elicia Claw, MD;  Location: [facility] ENDOSCOPY;  Service: Gastroenterology;  Laterality: N/A;   ESOPHAGOGASTRODUODENOSCOPY N/A 11/20/2023   Procedure: EGD (ESOPHAGOGASTRODUODENOSCOPY);  Surgeon: Dianna Specking, MD;  Location: [facility] ENDOSCOPY;  Service: Gastroenterology;  Laterality: N/A;   ESOPHAGOGASTRODUODENOSCOPY (EGD) WITH PROPOFOL  N/A 05/08/2021   Procedure: ESOPHAGOGASTRODUODENOSCOPY (EGD) WITH PROPOFOL ;  Surgeon: Saintclair Jasper, MD;  Location: [facility] ENDOSCOPY;  Service: Gastroenterology;  Laterality: N/A;   ESOPHAGOGASTRODUODENOSCOPY (EGD) WITH PROPOFOL  N/A 01/11/2022   Procedure: ESOPHAGOGASTRODUODENOSCOPY (EGD)  WITH PROPOFOL ;  Surgeon: Rosalie Kitchens, MD;  Location: [facility] ENDOSCOPY;  Service: Gastroenterology;  Laterality: N/A;   ESOPHAGOGASTRODUODENOSCOPY (EGD) WITH PROPOFOL  N/A 01/06/2023   Procedure: ESOPHAGOGASTRODUODENOSCOPY (EGD) WITH PROPOFOL ;  Surgeon: Rosalie Kitchens, MD;  Location: [facility] ENDOSCOPY;  Service: Gastroenterology;  Laterality: N/A;   ESOPHAGOGASTRODUODENOSCOPY (EGD) WITH PROPOFOL  N/A 04/21/2023   Procedure: ESOPHAGOGASTRODUODENOSCOPY (EGD) WITH PROPOFOL ;  Surgeon: Dianna Specking, MD;  Location: [facility] ENDOSCOPY;  Service: Gastroenterology;  Laterality: N/A;   EYE SURGERY Bilateral    to improve vision after cataract OR   GIVENS CAPSULE STUDY N/A 04/21/2023   Procedure: GIVENS CAPSULE STUDY;  Surgeon: Dianna Specking, MD;  Location: [facility] ENDOSCOPY;  Service: Gastroenterology;  Laterality: N/A;   GIVENS CAPSULE STUDY N/A 11/18/2023   Procedure: IMAGING PROCEDURE, GI TRACT, INTRALUMINAL, VIA CAPSULE;  Surgeon: Dianna Specking, MD;  Location: [facility] ENDOSCOPY;  Service: Gastroenterology;  Laterality: N/A;   HEMOSTASIS CLIP PLACEMENT  10/26/2023   Procedure: CONTROL OF HEMORRHAGE, GI TRACT, ENDOSCOPIC, BY CLIPPING OR OVERSEWING;  Surgeon: Elicia Claw, MD;  Location: [facility] ENDOSCOPY;  Service: Gastroenterology;;   IR ANGIOGRAM FOLLOW UP STUDY  12/19/2020   IR ANGIOGRAM SELECTIVE EACH ADDITIONAL VESSEL  12/19/2020   IR ANGIOGRAM SELECTIVE EACH ADDITIONAL VESSEL  12/19/2020   IR ANGIOGRAM SELECTIVE EACH ADDITIONAL VESSEL  12/28/2023   IR ANGIOGRAM VISCERAL SELECTIVE  12/19/2020   IR ANGIOGRAM VISCERAL SELECTIVE  12/28/2023   IR EMBO ART  VEN HEMORR LYMPH EXTRAV  INC GUIDE ROADMAPPING  12/19/2020   IR EMBO ART  VEN HEMORR LYMPH EXTRAV  INC GUIDE ROADMAPPING  12/28/2023   IR US  GUIDE VASC ACCESS RIGHT  12/19/2020   IR US  GUIDE VASC ACCESS RIGHT  12/28/2023   JOINT REPLACEMENT     POLYPECTOMY  05/04/2022   Procedure: POLYPECTOMY;  Surgeon: Saintclair Jasper, MD;  Location: [facility] ENDOSCOPY;  Service: Gastroenterology;;   POLYPECTOMY  10/26/2023    Procedure: POLYPECTOMY, INTESTINE;  Surgeon: Elicia Claw, MD;  Location: [facility] ENDOSCOPY;  Service: Gastroenterology;;   STAPEDES SURGERY Left    scraped stapedes because it was sticking when it wasn't suppose to   TONSILLECTOMY AND ADENOIDECTOMY  1946   TOTAL KNEE ARTHROPLASTY Left ~ 2008   TOTAL KNEE ARTHROPLASTY  12/20/2011   Procedure: TOTAL KNEE ARTHROPLASTY;  Surgeon: Elspeth JONELLE Her, MD;  Location: [facility] OR;  Service: Orthopedics;  Laterality: Right;  Right Total Knee Arthroplasty   TUBAL LIGATION  1980's   Family History Family History  Problem Relation Age of Onset   Heart attack Mother    Heart disease Father    Heart failure Father    Bladder Cancer Father    Supraventricular tachycardia Sister    Hypertension Sister     Social History Social History   Tobacco Use   Smoking status: Former    Current packs/day: 0.00    Average packs/day: 0.8 packs/day for 37.0 years (27.8 ttl pk-yrs)    Types: Cigarettes    Start date: 07/02/1955    Quit date: 07/01/1992    Years since quitting: 31.7    Passive exposure: Never   Smokeless tobacco: Never  Vaping Use   Vaping status: Never Used  Substance Use Topics   Alcohol  use: Yes    Alcohol /week: 7.0 standard drinks of alcohol     Types: 7 Glasses of wine per week    Comment: 09/05/2017  6 oz wine, 5 days/wk   Drug use: No    Types: Hydrocodone    Allergies Morphine and codeine, Aspirin, Duloxetine  hcl, Tymlos [  abaloparatide], Ventolin  [albuterol ], and Cefadroxil  Review of Systems Review of Systems  Physical Exam Vital Signs  I have reviewed the triage vital signs BP 113/74   Pulse 99   Temp 98.9 F (37.2 C) (Oral)   Resp 14   SpO2 100%   Physical Exam Vitals and nursing note reviewed.  Eyes:     Pupils: Pupils are equal, round, and reactive to light.  Cardiovascular:     Rate and Rhythm: Tachycardia present.  Pulmonary:     Effort: Pulmonary effort is normal.  Abdominal:     General: Abdomen is flat. There  is no distension.     Palpations: Abdomen is soft.     Tenderness: There is no abdominal tenderness. There is no guarding.  Genitourinary:    Comments: Dark red blood on digital rectal exam Neurological:     General: No focal deficit present.     Mental Status: She is alert.     ED Results and Treatments Labs (all labs ordered are listed, but only abnormal results are displayed) Labs Reviewed  COMPREHENSIVE METABOLIC PANEL WITH GFR - Abnormal; Notable for the following components:      Result Value   CO2 21 (*)    Glucose, Bld 155 (*)    Calcium  8.8 (*)    Total Protein 6.4 (*)    All other components within normal limits  CBC WITH DIFFERENTIAL/PLATELET - Abnormal; Notable for the following components:   RBC 3.38 (*)    Hemoglobin 10.3 (*)    HCT 33.3 (*)    RDW 15.6 (*)    Platelets 143 (*)    All other components within normal limits  TYPE AND SCREEN                                                                                                                          Radiology No results found.  Pertinent labs & imaging results that were available during my care of the patient were reviewed by me and considered in my medical decision making (see MDM for details).  Medications Ordered in ED Medications  iohexol  (OMNIPAQUE ) 350 MG/ML injection 100 mL (100 mLs Intravenous Contrast Given 03/13/24 2110)                                                                                                                                     Procedures .  Critical Care  Performed by: Mannie Fairy DASEN, DO Authorized by: Mannie Fairy DASEN, DO   Critical care provider statement:    Critical care time (minutes):  88   Critical care was necessary to treat or prevent imminent or life-threatening deterioration of the following conditions:  Shock   Critical care was time spent personally by me on the following activities:  Development of treatment plan with patient or surrogate,  discussions with consultants, evaluation of patient's response to treatment, examination of patient, ordering and review of laboratory studies, ordering and review of radiographic studies, ordering and performing treatments and interventions, pulse oximetry, re-evaluation of patient's condition and review of old charts   (including critical care time)  Medical Decision Making / ED Course   This patient presents to the ED for concern of GI bleeding, this involves an extensive number of treatment options, and is a complaint that carries with it a high risk of complications and morbidity.  The differential diagnosis includes lower GI bleed.  MDM: Upon arrival to the emergency room, patient tachycardic.  She is normotensive.  Digital rectal exam showed modest amount of dark red blood.  Abdomen soft.  Given the patient's history, was concerned about a brisk GI bleed.  Sent the patient over for CTA which did show active GI hemorrhage.  Reached out to interventional radiology, Dr. Johann, who stated he would come down to evaluate the patient.  Patient's initial hemoglobin 10.3.  I have began to transfuse the patient.  She has 2 IVs.  Will admit the patient to ICU for acute lower GI bleed.   Additional history obtained: -Additional history obtained from EMS -External records from outside source obtained and reviewed including: Chart review including previous notes, labs, imaging, consultation notes   Lab Tests: -I ordered, reviewed, and interpreted labs.   The pertinent results include:   Labs Reviewed  COMPREHENSIVE METABOLIC PANEL WITH GFR - Abnormal; Notable for the following components:      Result Value   CO2 21 (*)    Glucose, Bld 155 (*)    Calcium  8.8 (*)    Total Protein 6.4 (*)    All other components within normal limits  CBC WITH DIFFERENTIAL/PLATELET - Abnormal; Notable for the following components:   RBC 3.38 (*)    Hemoglobin 10.3 (*)    HCT 33.3 (*)    RDW 15.6 (*)     Platelets 143 (*)    All other components within normal limits  TYPE AND SCREEN        Imaging Studies ordered: I ordered imaging studies including CTA abdomen pelvis I independently visualized and interpreted imaging. I agree with the radiologist interpretation   Medicines ordered and prescription drug management: Meds ordered this encounter  Medications   iohexol  (OMNIPAQUE ) 350 MG/ML injection 100 mL    -I have reviewed the patients home medicines and have made adjustments as needed  Critical interventions Management of active brisk lower GI bleed  Cardiac Monitoring: The patient was maintained on a cardiac monitor.  I personally viewed and interpreted the cardiac monitored which showed an underlying rhythm of: Normal sinus rhythm   Reevaluation: After the interventions noted above, I reevaluated the patient and found that they have :improved  Co morbidities that complicate the patient evaluation  Past Medical History:  Diagnosis Date   Anxiety    Arthritis    bilateral knees, shoulders, elbows; neck, pretty widespread (09/05/2017)   BPPV (benign paroxysmal positional vertigo)    Depression  GERD (gastroesophageal reflux disease)    Glaucoma, both eyes    Headache    probably 2/month (09/05/2017)   History of blood transfusion ~ 2008   related to LGIB   Hyperlipemia    Lower GI bleeding ~ 2008; 09/05/2017   had to have blood transfusion   Macular degeneration, bilateral    Osteopenia    Seasonal allergies    Skin cancer, 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

## 2024-03-13 NOTE — ED Provider Notes (Signed)
 Emergent need to bypass labs for CT imaging due to brisk lower GI bleed with tachycardia and elderly patient.   Mannie Pac T, DO 03/13/24 2053

## 2024-03-13 NOTE — ED Triage Notes (Signed)
 Pt came in via EMS from [facility] w/ c/o of dark blood stools since this AM. Endorses RUQ pain rated 3/10. HX of gi bleeds. 0 thinners. COVID+, but asymptomatic. On 1L of O2 baseline.

## 2024-03-14 ENCOUNTER — Encounter (HOSPITAL_COMMUNITY): Payer: Self-pay | Admitting: Pulmonary Disease

## 2024-03-14 ENCOUNTER — Other Ambulatory Visit: Payer: Self-pay

## 2024-03-14 ENCOUNTER — Telehealth: Payer: Self-pay | Admitting: Adult Health

## 2024-03-14 DIAGNOSIS — U071 COVID-19: Secondary | ICD-10-CM

## 2024-03-14 HISTORY — PX: IR ANGIOGRAM SELECTIVE EACH ADDITIONAL VESSEL: IMG667

## 2024-03-14 HISTORY — PX: IR US GUIDE VASC ACCESS RIGHT: IMG2390

## 2024-03-14 HISTORY — PX: IR ANGIOGRAM VISCERAL SELECTIVE: IMG657

## 2024-03-14 LAB — CBC
HCT: 25.8 % — ABNORMAL LOW (ref 36.0–46.0)
HCT: 24.3 % — ABNORMAL LOW (ref 36.0–46.0)
Hemoglobin: 7.4 g/dL — ABNORMAL LOW (ref 12.0–15.0)
Hemoglobin: 7.4 g/dL — ABNORMAL LOW (ref 12.0–15.0)
MCH: 29.4 pg (ref 26.0–34.0)
MCH: 30.5 pg (ref 26.0–34.0)
MCHC: 28.7 g/dL — ABNORMAL LOW (ref 30.0–36.0)
MCHC: 30.5 g/dL (ref 30.0–36.0)
MCV: 102.4 fL — ABNORMAL HIGH (ref 80.0–100.0)
MCV: 100 fL (ref 80.0–100.0)
Platelets: 118 K/uL — ABNORMAL LOW (ref 150–400)
Platelets: 106 K/uL — ABNORMAL LOW (ref 150–400)
RBC: 2.52 MIL/uL — ABNORMAL LOW (ref 3.87–5.11)
RBC: 2.43 MIL/uL — ABNORMAL LOW (ref 3.87–5.11)
RDW: 15.5 % (ref 11.5–15.5)
RDW: 15.7 % — ABNORMAL HIGH (ref 11.5–15.5)
WBC: 3.9 K/uL — ABNORMAL LOW (ref 4.0–10.5)
WBC: 3.4 K/uL — ABNORMAL LOW (ref 4.0–10.5)
nRBC: 0 % (ref 0.0–0.2)
nRBC: 0 % (ref 0.0–0.2)

## 2024-03-14 LAB — BASIC METABOLIC PANEL WITH GFR
Anion gap: 9 (ref 5–15)
BUN: 13 mg/dL (ref 8–23)
CO2: 22 mmol/L (ref 22–32)
Calcium: 7.7 mg/dL — ABNORMAL LOW (ref 8.9–10.3)
Chloride: 107 mmol/L (ref 98–111)
Creatinine, Ser: 0.44 mg/dL (ref 0.44–1.00)
GFR, Estimated: >60 mL/min (ref >60)
Glucose, Bld: 138 mg/dL — ABNORMAL HIGH (ref 70–99)
Potassium: 3.9 mmol/L (ref 3.5–5.1)
Sodium: 138 mmol/L (ref 135–145)

## 2024-03-14 LAB — HEMOGLOBIN AND HEMATOCRIT, BLOOD
HCT: 23.7 % — ABNORMAL LOW (ref 36.0–46.0)
HCT: 25.4 % — ABNORMAL LOW (ref 36.0–46.0)
Hemoglobin: 6.9 g/dL — CL (ref 12.0–15.0)
Hemoglobin: 7.8 g/dL — ABNORMAL LOW (ref 12.0–15.0)

## 2024-03-14 LAB — MRSA NEXT GEN BY PCR, NASAL: MRSA by PCR Next Gen: NOT DETECTED

## 2024-03-14 LAB — RESP PANEL BY RT-PCR (RSV, FLU A&B, COVID)  RVPGX2
Influenza A by PCR: NEGATIVE
Influenza B by PCR: NEGATIVE
Resp Syncytial Virus by PCR: NEGATIVE
SARS Coronavirus 2 by RT PCR: POSITIVE — AB

## 2024-03-14 LAB — PREPARE RBC (CROSSMATCH)

## 2024-03-14 LAB — MAGNESIUM: Magnesium: 2.4 mg/dL (ref 1.7–2.4)

## 2024-03-14 LAB — PHOSPHORUS: Phosphorus: 3 mg/dL (ref 2.5–4.6)

## 2024-03-14 MED ORDER — IOHEXOL 300 MG/ML  SOLN
100.0000 mL | Freq: Once | INTRAMUSCULAR | Status: AC | PRN
  Administered 2024-03-14: 70 mL via INTRA_ARTERIAL

## 2024-03-14 MED ORDER — SODIUM CHLORIDE 0.9% IV SOLUTION
Freq: Once | INTRAVENOUS | Status: AC
Start: 2024-03-14 — End: 2024-03-14

## 2024-03-14 MED ORDER — FENTANYL CITRATE (PF) 100 MCG/2ML IJ SOLN
INTRAMUSCULAR | Status: AC | PRN
  Administered 2024-03-14 (×2): 25 ug via INTRAVENOUS

## 2024-03-14 MED ORDER — MIDAZOLAM HCL 2 MG/2ML IJ SOLN
INTRAMUSCULAR | Status: AC | PRN
  Administered 2024-03-13 – 2024-03-14 (×2): 1 mg via INTRAVENOUS

## 2024-03-14 NOTE — Progress Notes (Addendum)
 eLink Physician-Brief Progress Note Patient Name: [name] DOB: 1941/02/17 MRN: [id number]   Date of Service  03/14/2024  HPI/Events of Note  83 year old female with a history of chronic obstructive pulmonary disease on 1 L nasal cannula, GERD, dyslipidemia, and upper GI bleed in 2024 secondary to gastritis as well as lower GI bleed secondary to diverticulosis status post coiling who presents with dark bloody stools since the morning with right upper quadrant pain found to have transverse colon diverticulosis with lower GI bleeding.  On examination, vital signs are essentially normal with wide pulse pressures.  Saturating 99% on 2 L of oxygen .  Normal metabolic panel, mild normocytic anemia.  eICU Interventions  Check hemoglobin every 6 hours Hold transfusion for now, no evidence of shock, hemoglobin greater than 10.  Transfuse for hemoglobin less than 7 or shock  Supportive care otherwise, and DNR/DNI noted  Asymptomatic COVID-positive, add precautions, no meds  DVT prophylaxis with SCDs, chemoprophylaxis held in the setting of bleeding GI prophylaxis with therapeutic PPI in the setting of known GERD/UGIB   0421 - Hg 10.3 to 7.4. Transfuse 1u pRBC, continue to hold other unit. Trend Hg Q6H  Intervention Category Evaluation Type: New Patient Evaluation  Jamie-Lee Galdamez 03/14/2024, 1:43 AM

## 2024-03-14 NOTE — Progress Notes (Signed)
 NAME:  [name], MRN:  [id number], DOB:  1940/11/22, LOS: 1 ADMISSION DATE:  03/13/2024, CONSULTATION DATE:  03/13/2024 REFERRING MD: Mannie Fairy DASEN, DO, CHIEF COMPLAINT: Rectal bleeding started today   History of Present Illness:   83 yr old female patient with COPD on HOT @ 1 L [place], pulm nodule, depression, anxiety, RLS, HTN, GERD, dyslipidemia, Fe def anemia, UGIB Sep 2024 due to gastritis, and LGIB due to diverticulosis s/p coiling, who came from home west w/ c/o of dark blood stools since this AM x3, small. Endorses RUQ pain rated 3/10. She does not take anti-PLT meds or AC. Tested COVID+ yesterday, but asymptomatic.   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 IR was consulted and planned for procedure tonight. ICU admission. CTA with active hemorrhage in the distal transverse colon and splenic flexure secondary to diverticulosis however mesenteric angiogram by IR with no active bleed noted  Interim History / Subjective:   Getting PRBC transfusion for low hemoglobin  Objective    Blood pressure (!) 104/41, pulse 85, temperature 98.2 F (36.8 C), resp. rate (!) 21, height 5' 1 (1.549 m), weight 76.3 kg, SpO2 98%.       No intake or output data in the 24 hours ending 03/14/24 0941 Filed Weights   03/14/24 0125  Weight: 76.3 kg    Examination: Blood pressure (!) 104/41, pulse 85, temperature 98.2 F (36.8 C), resp. rate (!) 21, height 5' 1 (1.549 m), weight 76.3 kg, SpO2 98%. Gen:      No acute distress, frail, elderly HEENT:  EOMI, sclera anicteric Neck:     No masses; no thyromegaly Lungs:    Clear to auscultation bilaterally; normal respiratory effort CV:         Regular  rate and rhythm; no murmurs Abd:      +Mild abd tenderness Ext:    No edema; adequate peripheral perfusion Neuro: Somnolent arousable  Lab/imaging reviewed Significant for BUN/creatinine 13/0.44 Hemoglobin 6.9, WBC 3.9, platelets 118  Resolved problem list   Assessment and Plan  LGIB from the transverse colon, diverticulosis without diverticulitis.  Hx of LGIB due to diverticulosis s/p coiling Monitor in ICU Bleeding appears to have stabilized Consider GI consult if there is recurrent episodes.  Follow CBC, transfuse for hemoglobin less than 7 Continue PPI  COPD on chronic oxygen  1 L COVID-positive Reportedly COVID-positive as an outpatient and was started on Paxlovid  Lungs look okay on recent CT abdomen.  Will get a chest x-ray DuoNebs as needed  Depression, anxiety, RLS -Resume home meds when able to take p.o.  HTN -Hydralazine  PRN   Dyslipidemia -Hold statin for now  GERD, Fe def anemia, UGIB Sep 2024 due to gastritis -PPI -Monitor CBC  DNR/DNI: noted from medical chart provided from [facility]  Critical care time:   The patient is critically ill with multiple organ system failure and requires high complexity decision making for assessment and support, frequent evaluation and titration of therapies, advanced monitoring, review of radiographic studies and interpretation of complex data.   Critical Care Time devoted to patient care services, exclusive of separately billable procedures, described in this note is 35 minutes.   Lonna Coder MD [facility] See Amion for pager  If no response to pager , please call [phone] until 7pm After 7:00 pm call Elink  [phone] 03/14/2024, 9:55 AM

## 2024-03-14 NOTE — Plan of Care (Signed)
  Problem: Health Behavior/Discharge Planning: Goal: Ability to manage health-related needs will improve Outcome: Progressing   Problem: Clinical Measurements: Goal: Ability to maintain clinical measurements within normal limits will improve Outcome: Progressing Goal: Will remain free from infection Outcome: Progressing Goal: Diagnostic test results will improve Outcome: Progressing Goal: Respiratory complications will improve Outcome: Progressing Goal: Cardiovascular complication will be avoided Outcome: Progressing   Problem: Coping: Goal: Level of anxiety will decrease Outcome: Progressing

## 2024-03-14 NOTE — Telephone Encounter (Signed)
 Nurse called to report GIB (one bloody stool in toilet and floor), which pt has a hx of. Family requesting CBC. Order given.  Nurse called back and the bleeding had worsened. Order given to send to the ED.

## 2024-03-14 NOTE — Progress Notes (Signed)
 Patient had voiced the need to urinate several times before being bladder scanned. Bladder scan showed of urine so in and out was performed per protocol. If patient has difficulties voiding in the future foley may be required due to vaginal anatomy. Performing in and out was difficult.   Patient has had three medium sized bowel movements since this morning. All three bowel movements have appeared to be old blood, mostly large clots and very little stool present.

## 2024-03-14 NOTE — Progress Notes (Signed)
  IR BRIEF PROGRESS NOTE:  Attempted to round on patient this Am. Patient is toileting. Per RN report, no concerns with femoral access site at this time. Patient has had a bowel movement with old blood. No current concerns for fresh blood in stools. Patient Hgb 6.9 this am, just received 1 unit PRBC. On droplet and enteric precautions. IR will continue to follow.   Electronically Signed: Carlin DELENA Griffon, PA-C 03/14/2024, 10:19 AM

## 2024-03-14 NOTE — Procedures (Signed)
  Procedure:  Mesenteric arteriogram : no active  bleed or vascular  lesion localized in the region of concern from recent CTA Preprocedure diagnosis: The primary encounter diagnosis was Rectal bleeding. Diagnoses of Acute GI bleeding and Gastrointestinal hemorrhage associated with intestinal diverticulosis were also pertinent to this visit. Postprocedure diagnosis: same EBL:    minimal Complications:   none immediate  See full dictation in [employer].  CHARM Toribio Faes MD Main # [phone] Pager  [phone] Mobile [phone]

## 2024-03-15 LAB — BASIC METABOLIC PANEL WITH GFR
Anion gap: 9 (ref 5–15)
BUN: 12 mg/dL (ref 8–23)
CO2: 24 mmol/L (ref 22–32)
Calcium: 7.8 mg/dL — ABNORMAL LOW (ref 8.9–10.3)
Chloride: 110 mmol/L (ref 98–111)
Creatinine, Ser: 0.31 mg/dL — ABNORMAL LOW (ref 0.44–1.00)
GFR, Estimated: >60 mL/min (ref >60)
Glucose, Bld: 101 mg/dL — ABNORMAL HIGH (ref 70–99)
Potassium: 3.6 mmol/L (ref 3.5–5.1)
Sodium: 142 mmol/L (ref 135–145)

## 2024-03-15 LAB — HEMOGLOBIN AND HEMATOCRIT, BLOOD
HCT: 24.2 % — ABNORMAL LOW (ref 36.0–46.0)
HCT: 24.8 % — ABNORMAL LOW (ref 36.0–46.0)
HCT: 24.8 % — ABNORMAL LOW (ref 36.0–46.0)
Hemoglobin: 7.2 g/dL — ABNORMAL LOW (ref 12.0–15.0)
Hemoglobin: 7.3 g/dL — ABNORMAL LOW (ref 12.0–15.0)
Hemoglobin: 7.4 g/dL — ABNORMAL LOW (ref 12.0–15.0)

## 2024-03-15 LAB — CBC
HCT: 23.3 % — ABNORMAL LOW (ref 36.0–46.0)
Hemoglobin: 7.2 g/dL — ABNORMAL LOW (ref 12.0–15.0)
MCH: 31.4 pg (ref 26.0–34.0)
MCHC: 30.9 g/dL (ref 30.0–36.0)
MCV: 101.7 fL — ABNORMAL HIGH (ref 80.0–100.0)
Platelets: 103 K/uL — ABNORMAL LOW (ref 150–400)
RBC: 2.29 MIL/uL — ABNORMAL LOW (ref 3.87–5.11)
RDW: 15.8 % — ABNORMAL HIGH (ref 11.5–15.5)
WBC: 3.3 K/uL — ABNORMAL LOW (ref 4.0–10.5)
nRBC: 0 % (ref 0.0–0.2)

## 2024-03-15 MED ORDER — LATANOPROST 0.005 % OP SOLN
1.0000 [drp] | Freq: Every day | OPHTHALMIC | Status: DC
  Administered 2024-03-16 – 2024-03-17 (×2): 1 [drp] via OPHTHALMIC
  Filled 2024-03-15: qty 2.5

## 2024-03-15 MED ORDER — ROPINIROLE HCL 0.25 MG PO TABS: 0.5000 mg | ORAL_TABLET | Freq: Every day | ORAL | Status: DC | PRN

## 2024-03-15 MED ORDER — NETARSUDIL DIMESYLATE 0.02 % OP SOLN: 1.0000 [drp] | Freq: Every day | OPHTHALMIC | Status: DC

## 2024-03-15 MED ORDER — DORZOLAMIDE HCL-TIMOLOL MAL PF 2-0.5 % OP SOLN: 1.0000 [drp] | Freq: Every day | OPHTHALMIC | Status: DC

## 2024-03-15 MED ORDER — PROSIGHT PO TABS
1.0000 | ORAL_TABLET | Freq: Every day | ORAL | Status: DC
  Administered 2024-03-16 – 2024-03-18 (×3): 1 via ORAL
  Filled 2024-03-15 (×3): qty 1

## 2024-03-15 MED ORDER — DORZOLAMIDE HCL-TIMOLOL MAL 2-0.5 % OP SOLN
1.0000 [drp] | Freq: Every day | OPHTHALMIC | Status: DC
  Administered 2024-03-15 – 2024-03-18 (×4): 1 [drp] via OPHTHALMIC
  Filled 2024-03-15: qty 10

## 2024-03-15 MED ORDER — OXYBUTYNIN CHLORIDE ER 10 MG PO TB24
10.0000 mg | ORAL_TABLET | Freq: Every day | ORAL | Status: DC
  Administered 2024-03-16 – 2024-03-17 (×2): 10 mg via ORAL
  Filled 2024-03-15 (×2): qty 1

## 2024-03-15 MED ORDER — PANTOPRAZOLE SODIUM 40 MG PO TBEC: 40.0000 mg | DELAYED_RELEASE_TABLET | Freq: Two times a day (BID) | ORAL | Status: DC

## 2024-03-15 MED ORDER — FERROUS GLUCONATE 240 (27 FE) MG PO TABS: 240.0000 mg | ORAL_TABLET | Freq: Every morning | ORAL | Status: DC

## 2024-03-15 MED ORDER — NIRMATRELVIR/RITONAVIR (PAXLOVID)TABLET: 3.0000 | ORAL_TABLET | Freq: Two times a day (BID) | ORAL | Status: DC

## 2024-03-15 MED ORDER — FERROUS GLUCONATE 324 (38 FE) MG PO TABS
324.0000 mg | ORAL_TABLET | Freq: Every day | ORAL | Status: DC
Start: 2024-03-16 — End: 2024-03-18
  Administered 2024-03-16 – 2024-03-18 (×3): 324 mg via ORAL
  Filled 2024-03-15 (×3): qty 1

## 2024-03-15 MED ORDER — UMECLIDINIUM-VILANTEROL 62.5-25 MCG/ACT IN AEPB
1.0000 | INHALATION_SPRAY | Freq: Every day | RESPIRATORY_TRACT | Status: DC
  Administered 2024-03-15 – 2024-03-18 (×3): 1 via RESPIRATORY_TRACT
  Filled 2024-03-15: qty 14

## 2024-03-15 MED ORDER — PRESERVISION AREDS 2 PO CAPS
1.0000 | ORAL_CAPSULE | Freq: Two times a day (BID) | ORAL | Status: DC
Start: 2024-03-15 — End: 2024-03-15

## 2024-03-15 MED ORDER — IPRATROPIUM-ALBUTEROL 0.5-2.5 (3) MG/3ML IN SOLN: 3.0000 mL | Freq: Four times a day (QID) | RESPIRATORY_TRACT | Status: DC | PRN

## 2024-03-15 MED ORDER — SERTRALINE HCL 50 MG PO TABS
75.0000 mg | ORAL_TABLET | Freq: Every day | ORAL | Status: DC
  Administered 2024-03-15 – 2024-03-18 (×4): 75 mg via ORAL
  Filled 2024-03-15 (×2): qty 1
  Filled 2024-03-15 (×2): qty 2

## 2024-03-15 MED ORDER — UMECLIDINIUM-VILANTEROL 62.5-25 MCG/ACT IN AEPB: 1.0000 | INHALATION_SPRAY | Freq: Every day | RESPIRATORY_TRACT | Status: DC

## 2024-03-15 MED ORDER — PANTOPRAZOLE SODIUM 40 MG IV SOLR
40.0000 mg | Freq: Two times a day (BID) | INTRAVENOUS | Status: DC
  Administered 2024-03-15: 40 mg via INTRAVENOUS
  Filled 2024-03-15: qty 10

## 2024-03-15 NOTE — Plan of Care (Signed)

## 2024-03-15 NOTE — Progress Notes (Signed)
 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 Endorses RUQ pain rated 3/10. She does not take anti-PLT meds or AC. Tested COVID+ yesterday, but asymptomatic.   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 IR was consulted and planned for procedure tonight. ICU admission. CTA with active hemorrhage in the distal transverse colon and splenic flexure secondary to diverticulosis however mesenteric angiogram by IR with no active bleed noted 9/14 transfused 1u PRBC Hgb 6.9  Interim History / Subjective:  One BM overnight, questionably more brighter red than previous dark Pt without complaint, other than being thirsty Hgb 7.2  Objective    Blood pressure 115/81, pulse 96, temperature 98.5 F (36.9 C), temperature source Oral, resp. rate (!) 22, height 5' 1 (1.549 m), weight 76.3 kg, SpO2 98%.        Intake/Output Summary (Last 24 hours) at 03/15/2024 0717 Last data filed at 03/14/2024 1300 Gross per 24 hour  Intake 315 ml  Output 500 ml  Net -185 ml   Filed Weights   03/14/24 0125  Weight: 76.3 kg    Examination: Blood pressure (!) 104/41, pulse 85, temperature 98.2 F (36.8 C), resp. rate (!) 18, height 5' 1 (1.549 m),  weight 76.3 kg, SpO2 98%.  General:  Frail elderly female lying in bed in NAD HEENT: MM pink/dry, pupils 4/r Neuro:  sleepy but oriented to person, place, and month, not year.  MAE/ generalized weakness CV: rr, NSR, no murmur PULM:  non labored, clear, diminished in bases, no wheeze, 1L at 97% GI: obese, soft, ND, tenderness in RLQ Extremities: warm/dry, no pitting LE edema  Skin: no rashes  Afebrile UOP plus 4x unmeasured   Labs> stable BMET, WBC 3.9> 3.3, H/H 7.4/ 23.3, plts 106> 103  Resolved problem list   Assessment and Plan  LGIB from the transverse colon, diverticulosis without diverticulitis.  Hx of LGIB due to diverticulosis s/p coiling (12/20/23) - SBP overall stable, low diastolic's have lowered MAP, remains asymptomatic.  Several UOP and sCr stable - repeat H/H now if stable, start clear liquid diet and can transfer to PCU - transfuse for Hgb < 7/ or symptomatic  - change to PPI BID for now - if repeat bleeding, consult GI.  Was supposed to f/u with Dr. Dianna outpt after last admission  COPD on chronic oxygen  1 L COVID-positive Reportedly COVID-positive as an outpatient and was started on Paxlovid  - remains asymptomatic - cont supplemental O2 prn sat goal > 88% - resume anoro and prn BD - resume paxlovid  when taking POs  Depression, anxiety, RLS - likely restart today pending H/H   HTN -Hydralazine  PRN, has not needed   Dyslipidemia - resume home meds when  taking po  GERD, Fe def anemia, UGIB Sep 2024 due to gastritis - change PPI to IV while NPO - trend CBC  Poor mobility  - uses walker at baseline, may need PT.  Get OOB   DNR/DNI: noted from medical chart provided from NH   Critical care time: n/a     Lyle Pesa, NP [facility] 03/15/2024, 7:17 AM  See Amion for pager If no response to pager , please call [phone] until 7pm After 7:00 pm call Elink  [phone]

## 2024-03-15 NOTE — TOC Initial Note (Signed)
 Transition of Care [facility]) - Initial/Assessment Note    Patient Details  Name: [name] MRN: [id number] Date of Birth: 27-Nov-1940  [facility]) CM/SW Contact:    Jon ONEIDA Anon, RN Phone Number: 03/15/2024, 11:16 AM  Clinical Narrative:                 Pt is from [facility]. Pt is needing continued medical workup, not currently medically stable for discharge. IP Care Management following for any new recommendations or any DC needs.  Expected Discharge Plan: Assisted Living Barriers to Discharge: Continued Medical Work up   Patient Goals and CMS Choice Patient states their goals for this hospitalization and ongoing recovery are:: To return to ALF CMS Medicare.gov Compare Post Acute Care list provided to:: Other (Comment Required) (NA) Choice offered to / list presented to : NA [place] ownership interest in [facility].provided to:: Parent NA    Expected Discharge Plan and Services In-house Referral: NA Discharge Planning Services: CM Consult Post Acute Care Choice: NA Living arrangements for the past 2 months: Assisted Living Facility                 DME Arranged: N/A DME Agency: NA       HH Arranged: NA HH Agency: NA        Prior Living Arrangements/Services Living arrangements for the past 2 months: Assisted Living Facility Lives with:: Spouse, Facility Resident Patient language and need for interpreter reviewed:: Yes Do you feel safe going back to the place where you live?: Yes      Need for Family Participation in Patient Care: Yes (Comment) Care giver support system in place?: Yes (comment) Current home services: DME Criminal Activity/Legal Involvement Pertinent to Current Situation/Hospitalization: No - Comment as needed  Activities of Daily Living   ADL Screening (condition at time of admission) Independently performs ADLs?: No Does the patient have a NEW difficulty with bathing/dressing/toileting/self-feeding that  is expected to last >3 days?: No Does the patient have a NEW difficulty with getting in/out of bed, walking, or climbing stairs that is expected to last >3 days?: No Does the patient have a NEW difficulty with communication that is expected to last >3 days?: No Is the patient deaf or have difficulty hearing?: Yes Does the patient have difficulty seeing, even when wearing glasses/contacts?: No Does the patient have difficulty concentrating, remembering, or making decisions?: No  Permission Sought/Granted Permission sought to share information with : Family Supports Permission granted to share information with : Yes, Verbal Permission Granted  Share Information with NAME: Knack,[name] (Spouse)  [phone]           Emotional Assessment Appearance:: Appears stated age Attitude/Demeanor/Rapport: Unable to Assess Affect (typically observed): Unable to Assess Orientation: : Oriented to Self, Oriented to Place, Oriented to Situation Alcohol  / Substance Use: Not Applicable Psych Involvement: No (comment)  Admission diagnosis:  Lower gastrointestinal bleeding [K92.2] Rectal bleeding [K62.5] Acute GI bleeding [K92.2] Gastrointestinal hemorrhage associated with intestinal diverticulosis [K57.91] Patient Active Problem List   Diagnosis Date Noted   Lower gastrointestinal bleeding 03/13/2024   Hypokalemia 12/28/2023   Acute on chronic blood loss anemia 12/27/2023   Acute lower GI bleeding 12/27/2023   Closed compression fracture of body of L1 vertebra (HCC) 12/09/2023   Urinary incontinence 12/05/2023   Acute GI bleeding 11/18/2023   Ischemic colitis (HCC) 10/27/2023   Acute upper GI bleed 10/25/2023   Edema of right lower extremity 10/25/2023   Chronic  heart failure with preserved ejection fraction (HFpEF) (HCC) 10/25/2023   UGI bleed 04/19/2023   Recurrent depression (HCC) 04/13/2023   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   History of GI diverticular bleed  03/28/2023   GAD (generalized anxiety disorder) 03/28/2023   Diastolic heart failure (HCC) 03/19/2023   Class 1 obesity 03/19/2023   Thoracic aorta atherosclerosis (HCC) 03/19/2023   Melena 01/05/2023   Hypotension 05/02/2022   UTI (urinary tract infection) 02/05/2022   Restless leg syndrome 02/05/2022   Senile osteoporosis 02/05/2022   Hypertension 02/05/2022   Pulmonary nodule 1 cm or greater in diameter 02/05/2022   GERD (gastroesophageal reflux disease) 02/05/2022   Upper GI bleed 01/07/2022   Iron  deficiency anemia 05/02/2021   Chronic anemia    DOE (dyspnea on exertion) 11/18/2017   COPD (chronic obstructive pulmonary disease) (HCC) 11/18/2017   Diverticulosis of intestine with bleeding 09/05/2017   Acute respiratory failure with hypoxia (HCC) 08/07/2017   Depression with anxiety 08/07/2017   Hyperlipidemia 08/07/2017   Macular degeneration 08/07/2017   Glaucoma 08/07/2017   Weakness 08/07/2017   Dehydration 08/07/2017   Lower GI bleeding 10/14/2016   Diverticulosis of colon 10/14/2016   Anxiety 10/14/2016   Lower GI bleed 10/14/2016   GI bleed    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 [place] [address] Phone: [phone] Fax: [phone]  [facility] - [place], [place] - [place] [address] [place] [address] Phone: [phone] Fax: [phone]     Social Drivers of Health (SDOH) Social History: SDOH Screenings   Food Insecurity: No Food Insecurity (03/14/2024)  Housing: Low Risk  (03/14/2024)  Transportation Needs: No Transportation Needs (03/14/2024)  Utilities: Not At Risk (03/14/2024)  Alcohol  Screen: Low Risk  (04/04/2023)  Depression (PHQ2-9): Low Risk   (03/04/2024)  Financial Resource Strain: Low Risk  (04/04/2023)  Physical Activity: Insufficiently Active (04/04/2023)  Social Connections: Socially Integrated (03/14/2024)  Stress: No Stress Concern Present (04/04/2023)  Tobacco Use: Medium Risk (03/14/2024)  Health Literacy: Adequate Health Literacy (04/04/2023)   SDOH Interventions:     Readmission Risk Interventions    03/15/2024   11:12 AM 01/01/2024   11:17 AM 11/19/2023   11:31 AM  Readmission Risk Prevention Plan  Transportation Screening Complete Complete Complete  PCP or Specialist Appt within 5-7 Days   Complete  PCP or Specialist Appt within 3-5 Days Complete Complete   Home Care Screening   Complete  Medication Review (RN CM)   Complete  HRI or Home Care Consult Complete Complete   Social Work Consult for Recovery Care Planning/Counseling Complete Complete   Palliative Care Screening Not Applicable Not Applicable   Medication Review Oceanographer) Complete Complete

## 2024-03-15 NOTE — IPAL (Signed)
  Interdisciplinary Goals of Care Family Meeting   Date carried out: 03/15/2024  Location of the meeting: Bedside  Member's involved: Nurse Practitioner and Bedside Registered Nurse  Durable Power of Attorney or acting medical decision maker: patient    Discussion: We discussed goals of care for [facility] .  Pt stating she wants to go home with hospice.  She feels like she has been declining for months without improvement and a burden and drain on her family.  At this time she is hemodynamically stable.  Will not pursue any further labs per request as if any decompensation or rebleeding, will transition to comfort focused care.  Pt states she discussed with her husband already this afternoon about this. Previously DNR/ DNI. PMT consulted to assist with ongoing goals moving forward.   Code status:   Code Status: Do not attempt resuscitation (DNR) - Comfort care   Disposition: Continue current acute care> no escalation of care.    Time spent for the meeting: 15 mins    Lyle Pesa, NP  03/15/2024, 6:32 PM

## 2024-03-16 DIAGNOSIS — D62 Acute posthemorrhagic anemia: Secondary | ICD-10-CM

## 2024-03-16 DIAGNOSIS — K922 Gastrointestinal hemorrhage, unspecified: Secondary | ICD-10-CM

## 2024-03-16 MED ORDER — VITAMIN B-12 1000 MCG PO TABS
1000.0000 ug | ORAL_TABLET | Freq: Every day | ORAL | Status: DC
  Administered 2024-03-16 – 2024-03-18 (×3): 1000 ug via ORAL
  Filled 2024-03-16 (×3): qty 1

## 2024-03-16 MED ORDER — ACETAMINOPHEN 325 MG PO TABS: 650.0000 mg | ORAL_TABLET | ORAL | Status: DC | PRN

## 2024-03-16 MED ORDER — PANTOPRAZOLE SODIUM 40 MG PO TBEC
40.0000 mg | DELAYED_RELEASE_TABLET | Freq: Two times a day (BID) | ORAL | Status: DC
  Administered 2024-03-16 – 2024-03-18 (×4): 40 mg via ORAL
  Filled 2024-03-16 (×5): qty 1

## 2024-03-16 MED ORDER — INFLUENZA VAC SPLIT HIGH-DOSE 0.5 ML IM SUSY
0.5000 mL | PREFILLED_SYRINGE | INTRAMUSCULAR | Status: AC
  Administered 2024-03-17: 0.5 mL via INTRAMUSCULAR
  Filled 2024-03-16: qty 0.5

## 2024-03-16 NOTE — TOC Progression Note (Signed)
 Transition of Care [facility]) - Progression Note    Patient Details  Name: [name] MRN: [id number] Date of Birth: 1941/05/22  Transition of Care [facility]) CM/SW Contact  Jon ONEIDA Anon, RN Phone Number: 03/16/2024, 11:40 AM  Clinical Narrative:    NCM received consult for palliative services once pt discharges home. Pt agreeable to palliative care once discharged. NCM sent referral to [facility] to [place], hospital liaison and she accepts referral. IP Care Management continuing to follow.       Expected Discharge Plan: Assisted Living Barriers to Discharge: Continued Medical Work up               Expected Discharge Plan and Services In-house Referral: NA Discharge Planning Services: CM Consult Post Acute Care Choice: NA Living arrangements for the past 2 months: Assisted Living Facility                 DME Arranged: N/A DME Agency: NA       HH Arranged: NA HH Agency: NA         Social Drivers of Health (SDOH) Interventions SDOH Screenings   Food Insecurity: No Food Insecurity (03/14/2024)  Housing: Low Risk  (03/14/2024)  Transportation Needs: No Transportation Needs (03/14/2024)  Utilities: Not At Risk (03/14/2024)  Alcohol  Screen: Low Risk  (04/04/2023)  Depression (PHQ2-9): Low Risk  (03/04/2024)  Financial Resource Strain: Low Risk  (04/04/2023)  Physical Activity: Insufficiently Active (04/04/2023)  Social Connections: Socially Integrated (03/14/2024)  Stress: No Stress Concern Present (04/04/2023)  Tobacco Use: Medium Risk (03/14/2024)  Health Literacy: Adequate Health Literacy (04/04/2023)    Readmission Risk Interventions    03/15/2024   11:12 AM 01/01/2024   11:17 AM 11/19/2023   11:31 AM  Readmission Risk Prevention Plan  Transportation Screening Complete Complete Complete  PCP or Specialist Appt within 5-7 Days   Complete  PCP or Specialist Appt within 3-5 Days Complete Complete   Home Care Screening   Complete  Medication Review (RN CM)    Complete  HRI or Home Care Consult Complete Complete   Social Work Consult for Recovery Care Planning/Counseling Complete Complete   Palliative Care Screening Not Applicable Not Applicable   Medication Review Oceanographer) Complete Complete

## 2024-03-16 NOTE — Progress Notes (Signed)
 [id number] [facility] Liaison  Notified by [facility] manager, Jon Anon, of patient/family request for [facility] at home after discharge.  Hospital liaison will follow patient for discharge disposition.  Please call with any hospice or outpatient palliative care related questions.  Thank you for the opportunity to participate in this patient's care.  Inocente Jacobs, BSN [facility] Liaision [phone]

## 2024-03-16 NOTE — Consult Note (Signed)
 Consultation Note Date: 03/16/2024   Patient Name: [name]  DOB: 1941/06/22  MRN: [id number]  Age / Sex: 83 y.o., female  PCP: Gil Greig BRAVO, NP Referring Physician: Verdene Purchase, MD  Reason for Consultation: Establishing goals of care  HPI/Patient Profile: 83 y.o. female admitted on 03/13/2024    Clinical Assessment and Goals of Care: 73 year old lady from [facility] [place], underlying COPD on home oxygen  at 1 L nasal cannula pulmonary nodule depression anxiety restless leg syndrome hypertension GERD dyslipidemia iron  deficiency anemia Upper GI bleed September 2024 due to gastritis and lower GI bleed due to diverticulosis status post coiling Admitted with dark bloody stools, incidentally tested positive for COVID, admitted to hospital medicine service currently. At the time of admission, patient indicated preference for comfort measures Citing functional decline and preference for consideration for hospice.  She stated that she has already discussed this with her husband Palliative consult request received Patient seen Discussed with nursing colleagues Chart reviewed, also discussed with TOC. Patient resting comfortably, diet has been advanced, patient tolerating.  Hemoglobin stable.  No further episodes of GI bleed since admission  Palliative medicine is specialized medical care for people living with serious illness. It focuses on providing relief from the symptoms and stress of a serious illness. The goal is to improve quality of life for both the patient and the family. Goals of care: Broad aims of medical therapy in relation to the patient's values and preferences. Our aim is to provide medical care aimed at enabling patients to achieve the goals that matter most to them, given the circumstances of their particular medical situation and their constraints.   NEXT OF KIN Spouse and son  SUMMARY  OF RECOMMENDATIONS   Agree with DNR Agree with current mode of care Recommend addition of palliative services at a [facility] [place] upon discharge, will discuss with TOC Thank you for the consult Code Status/Advance Care Planning: DNR   Symptom Management:     Palliative Prophylaxis:  Delirium Protocol  Additional Recommendations (Limitations, Scope, Preferences): No Surgical Procedures  Psycho-social/Spiritual:  Desire for further Chaplaincy support:yes Additional Recommendations: Caregiving  Support/Resources  Prognosis:  < 12 months  Discharge Planning:    Back to facility with additional palliative services    Primary Diagnoses: Present on Admission:  Lower gastrointestinal bleeding   I have reviewed the medical record, interviewed the patient and family, and examined the patient. The following aspects are pertinent.  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, left side   Sleeping excessive    Tinnitus of both ears    Social History   Socioeconomic History   Marital status: Married    Spouse name: Signe    Number of children: 3   Years of education: College   Highest education level: Not on file  Occupational History   Occupation: Retired Runner, broadcasting/film/video  Tobacco Use   Smoking status: Former    Current packs/day: 0.00    Average packs/day: 0.8 packs/day for 37.0 years (27.8 ttl pk-yrs)    Types: Cigarettes    Start date:  07/02/1955    Quit date: 07/01/1992    Years since quitting: 31.7    Passive exposure: Never   Smokeless tobacco: Never  Vaping Use   Vaping status: Never Used  Substance and Sexual Activity   Alcohol  use: Yes    Alcohol /week: 7.0 standard drinks of alcohol     Types: 7 Glasses of wine per week    Comment: 09/05/2017  6 oz wine, 5 days/wk   Drug use: No    Types: Hydrocodone    Sexual activity: Not Currently  Other Topics Concern   Not on file  Social History Narrative   1-2 cups of coffee a day    Social Drivers of Health   Financial Resource Strain: Low Risk  (04/04/2023)   Overall Financial Resource Strain (CARDIA)    Difficulty of Paying Living Expenses: Not hard at all  Food Insecurity: No Food Insecurity (03/14/2024)   Hunger Vital Sign    Worried About Running Out of Food in the Last Year: Never true    Ran Out of Food in the Last Year: Never true  Transportation Needs: No Transportation Needs (03/14/2024)   PRAPARE - Administrator, Civil Service (Medical): No    Lack of Transportation (Non-Medical): No  Physical Activity: Insufficiently Active (04/04/2023)   Exercise Vital Sign    Days of Exercise per Week: 2 days    Minutes of Exercise per Session: 20 min  Stress: No Stress Concern Present (04/04/2023)   [employer] of Occupational Health - Occupational Stress Questionnaire    Feeling of Stress : Only a little  Social Connections: Socially Integrated (03/14/2024)   Social Connection and Isolation Panel    Frequency of Communication with Friends and Family: More than three times a week    Frequency of Social Gatherings with Friends and Family: More than three times a week    Attends Religious Services: 1 to 4 times per year    Active Member of [employer] or Organizations: Yes    Attends Banker Meetings: 1 to 4 times per year    Marital Status: Married   Family History  Problem Relation Age of Onset   Heart attack Mother    Heart disease  Father    Heart failure Father    Bladder Cancer Father    Supraventricular tachycardia Sister    Hypertension Sister    Scheduled Meds:  Chlorhexidine  Gluconate Cloth  6 each Topical Daily   cyanocobalamin   1,000 mcg Oral Daily   dorzolamide -timolol   1 drop Both Eyes Daily   ferrous gluconate   324 mg Oral Q breakfast   latanoprost   1 drop Both Eyes QHS   multivitamin  1 tablet Oral Daily   Netarsudil  Dimesylate  1 drop Both Eyes QHS   oxybutynin   10 mg Oral  QHS   pantoprazole   40 mg Oral BID AC   sertraline   75 mg Oral Daily   umeclidinium-vilanterol  1 puff Inhalation Daily   Continuous Infusions: PRN Meds:.acetaminophen , docusate sodium , ipratropium-albuterol , polyethylene glycol, rOPINIRole  Medications Prior to Admission:  Prior to Admission medications   Medication Sig Start Date End Date Taking? Authorizing Provider  Acetaminophen  (TYLENOL  PO) Take 500 mg by mouth daily as needed.    [provider]  acetaminophen  (TYLENOL ) 650 MG CR tablet Take 1,300 mg by mouth 2 (two) times daily.    [provider]  albuterol  (VENTOLIN  HFA) 108 (90 Base) MCG/ACT inhaler Inhale 2 puffs into the lungs every 4 (four) hours as needed for wheezing or shortness of breath.    [provider]  alendronate (FOSAMAX) 70 MG tablet Take 70 mg by mouth every Sunday. Take with a full glass of water on an empty stomach.    [provider]  Ascorbic Acid  (VITAMIN C ) 1000 MG tablet Take 1,000 mg by mouth every morning.    [provider]  cetirizine  (ZYRTEC ) 5 MG chewable tablet Chew 5 mg by mouth daily as needed for allergies.    [provider]  Cholecalciferol  (VITAMIN D3) 50 MCG (2000 UT) TABS Take 2,000 Units by mouth every morning.    [provider]  cyanocobalamin  (VITAMIN B12) 1000 MCG tablet Take 1,000 mcg by mouth daily.    [provider]  Dorzolamide  HCl-Timolol  Mal PF 2-0.5 % SOLN Place 1 drop into both eyes in the morning  and at bedtime.    [provider]  doxycycline  (VIBRA -TABS) 100 MG tablet Take 1 tablet (100 mg total) by mouth 2 (two) times daily for 7 days. 03/12/24 03/19/24  [name], [name], NP  ferrous gluconate  (FERGON) 240 (27 FE) MG tablet Take 240 mg by mouth every morning. Take without food    [provider]  fluticasone  (FLONASE ) 50 MCG/ACT nasal spray Place 1 spray into both nostrils as needed for allergies or rhinitis.    [provider]  guaifenesin  (ROBITUSSIN) 100 MG/5ML syrup Take 10 mLs by mouth every 4 (four) hours as needed for cough.    [provider]  latanoprost  (XALATAN ) 0.005 % ophthalmic solution Place 1 drop into both eyes at bedtime.    [provider]  loperamide  (IMODIUM  A-D) 2 MG tablet Take 4 mg by mouth as needed for diarrhea or loose stools.    [provider]  Multiple Vitamins-Minerals (PRESERVISION AREDS 2) CAPS Take 1 capsule by mouth 2 (two) times daily.    [provider]  Netarsudil  Dimesylate (RHOPRESSA ) 0.02 % SOLN Place 1 drop into both eyes at bedtime.    [provider]  nirmatrelvir /ritonavir  (PAXLOVID ) 20 x 150 MG & 10 x 100MG  TABS Take 3 tablets by mouth 2 (two) times daily for 5 days. (Take nirmatrelvir  150 mg two tablets twice daily for 5 days and ritonavir  100 mg one tablet twice daily for 5 days) Patient GFR is 60 03/12/24 03/17/24  [name], [name], NP  oxybutynin  (DITROPAN -XL) 10 MG 24 hr tablet Take 10 mg by mouth at bedtime.    [provider]  pantoprazole  (PROTONIX ) 40 MG tablet Take 1 tablet (40 mg total) by mouth 2 (two) times daily before a meal. 11/24/23   Amin, Ankit C, MD  Polyethylene Glycol 3350  (MIRALAX  PO) Take 8.6 g by mouth daily as needed.    [provider]  potassium chloride  SA (KLOR-CON  M) 20 MEQ tablet  Take 20 mEq by mouth daily.    [provider]  Probiotic Product (ALIGN) 4 MG CAPS Take 4 mg by mouth at bedtime.    [provider]   rOPINIRole  (REQUIP ) 0.5 MG tablet Take 1 tablet (0.5 mg total) by mouth daily as needed. 04/30/23   [name], [name], NP  senna (SENOKOT) 8.6 MG TABS tablet Take 2 tablets by mouth at bedtime.    [provider]  sertraline  (ZOLOFT ) 50 MG tablet Take 1.5 tablets (75 mg total) by mouth daily. 01/07/23   Samtani, Jai-Gurmukh, MD  simvastatin  (ZOCOR ) 20 MG tablet Take 20 mg by mouth every evening. HOLD WHILE ON PAXLOVID     [provider]  Sodium Fluoride  (PREVIDENT 5000 BOOSTER PLUS) 1.1 % PSTE Place 1 Application onto teeth See admin instructions. Brush on teeth with a toothbrush after evening mouth care. Spit out excess and do not rinse.    [provider]  torsemide  (DEMADEX ) 20 MG tablet Take 20 mg by mouth every morning.    [provider]  traMADol  HCl 25 MG TABS Take 25 mg by mouth 2 (two) times daily. 03/02/24   Medina-Vargas, Monina C, NP  umeclidinium-vilanterol (ANORO ELLIPTA ) 62.5-25 MCG/ACT AEPB Inhale 1 puff into the lungs every morning.    [provider]  zinc oxide 20 % ointment Apply 1 Application topically as needed for irritation.    [provider]   Allergies  Allergen Reactions   Morphine And Codeine Itching   Aspirin Other (See Comments)    Unknown reaction   Duloxetine  Hcl Other (See Comments)    Unknown reaction   Tymlos [Abaloparatide] Other (See Comments)    Unknown reaction   Ventolin  [Albuterol ] Other (See Comments)    rapid heart beat   Cefadroxil Hives    Patient can take amoxicillin  and cipro    Review of Systems +weakness  Physical Exam Weak appearing right in bed Appears to have regular work of breathing Not in any acute distress Monitor noted  Vital Signs: BP (!) 113/37 (BP Location: Left Arm)   Pulse 75   Temp 98.1 F (36.7 C) (Oral)   Resp 20   Ht 5' 1 (1.549 m)   Wt 76.3 kg   SpO2 99%   BMI 31.78 kg/m  Pain Scale: 0-10   Pain Score: 0-No pain   SpO2: SpO2: 99 % O2 Device:SpO2: 99  % O2 Flow Rate: .O2 Flow Rate (L/min): 2 L/min  IO: Intake/output summary:  Intake/Output Summary (Last 24 hours) at 03/16/2024 1115 Last data filed at 03/16/2024 1023 Gross per 24 hour  Intake 480 ml  Output --  Net 480 ml    LBM: Last BM Date : 03/15/24 Baseline Weight: Weight: 76.3 kg Most recent weight: Weight: 76.3 kg     Palliative Assessment/Data:   PPS 50%  Time In: 10 Time Out: 11.15 Time Total:  75 Greater than 50%  of this time was spent counseling and coordinating care related to the above assessment and plan.  Signed by: Lonia Serve, MD   Please contact Palliative Medicine Team phone at [phone] for questions and concerns.  For individual provider: See Tracey

## 2024-03-16 NOTE — Progress Notes (Signed)
 [facility] PROGRESS NOTE   [name] FMW:[id number] DOB: 1941-05-06 DOA: 03/13/2024  PCP: Gil Greig BRAVO, NP  Brief History: 83 yr old female patient with COPD on home oxygen  @ 1 L [place], pulm nodule, depression, anxiety, RLS, HTN, GERD, dyslipidemia, Fe def anemia, UGIB Sep 2024 due to gastritis, and LGIB due to diverticulosis s/p coiling, [facility] [place] [place] of dark blood stools.  Incidentally tested positive for COVID.  She was hospitalized for further management.    Consultants: Interventional radiology  Procedures: Mesenteric angiogram without any active bleeding    Subjective/Interval History: Patient denies any abdominal pain.  Feels fatigued.  No chest pain or shortness of breath.  No nausea or vomiting.  Tolerated liquid diet yesterday.    Assessment/Plan:  Lower GI bleed Came in with rectal bleeding.  She has a history of lower GI bleed due to diverticulosis in the past and is status post coiling in June 2025.  Patient was admitted to the ICU.  Was seen by interventional radiology and underwent mesenteric angiogram to attempt another coiling but no active bleeding was noted during this angiogram. Hemoglobin noted to be 7.3 as of yesterday. In the interim patient has decided to forego further treatments and testings.  She wants to be comfortable.  She wants to consider going to hospice.  Acute blood loss anemia Back in July hemoglobin was 8.5.  Came in with a hemoglobin of 10.3 which was likely hemoconcentrated.  Next hemoglobin was 7.4.  Dropped down to 6.9.  She was transfused PRBC.  History of COPD/chronic respiratory failure with hypoxia Stable.  COVID-19 infection Incidentally positive for COVID.  Does not appear to have significant symptoms from the same.  No indication for COVID-specific treatments.  History of depression anxiety Stable.  Continue home medications.  Essential hypertension Soft blood pressures noted.  Not noted to be  on any antihypertensives.  Glaucoma Continue eyedrops.  Class I obesity Estimated body mass index is 31.78 kg/m as calculated from the following:   Height as of this encounter: 5' 1 (1.549 m).   Weight as of this encounter: 76.3 kg.  Goals of care Based on discussions with critical care medicine yesterday patient wants to transition to hospice and comfort care.  Palliative care has been consulted to assist.  DVT Prophylaxis: SCDs Code Status: DNR Family Communication: No family at bedside.  Discussed with patient Disposition Plan: To be determined.  She is here from SNF      Medications: Scheduled:  Chlorhexidine  Gluconate Cloth  6 each Topical Daily   cyanocobalamin   1,000 mcg Oral Daily   dorzolamide -timolol   1 drop Both Eyes Daily   ferrous gluconate   324 mg Oral Q breakfast   latanoprost   1 drop Both Eyes QHS   multivitamin  1 tablet Oral Daily   Netarsudil  Dimesylate  1 drop Both Eyes QHS   oxybutynin   10 mg Oral QHS   pantoprazole   40 mg Oral BID AC   sertraline   75 mg Oral Daily   umeclidinium-vilanterol  1 puff Inhalation Daily   Continuous: PRN:acetaminophen , docusate sodium , ipratropium-albuterol , polyethylene glycol, rOPINIRole   Antibiotics: Anti-infectives (From admission, onward)    Start     Dose/Rate Route Frequency Ordered Stop   03/15/24 1445  nirmatrelvir /ritonavir  (PAXLOVID ) 3 tablet  Status:  Discontinued        3 tablet Oral 2 times daily 03/15/24 1349 03/15/24 1357       Objective:  Vital Signs  Vitals:  03/15/24 2200 03/16/24 0000 03/16/24 0200 03/16/24 0725  BP: (!) 128/58 (!) 99/40 (!) 99/28   Pulse: 83 73 75   Resp: 17 (!) 21 20   Temp:    98.1 F (36.7 C)  TempSrc:    Oral  SpO2: 100% 100% 100%   Weight:      Height:       No intake or output data in the 24 hours ending 03/16/24 0824 Filed Weights   03/14/24 0125  Weight: 76.3 kg    General appearance: Awake alert.  In no distress.  Fatigued. Resp: Clear to  auscultation bilaterally.  Normal effort Cardio: S1-S2 is normal regular.  No S3-S4.  No rubs murmurs or bruit GI: Abdomen is soft.  Nontender nondistended.  Bowel sounds are present normal.  No masses organomegaly Extremities: No edema.  Moving her extremities but physical deconditioning is noted. Neurologic: Alert and oriented x3.  No focal neurological deficits.    Lab Results:  Data Reviewed: I have personally reviewed following labs and reports of the imaging studies  CBC: Recent Labs  Lab 03/13/24 2052 03/14/24 0313 03/14/24 0730 03/14/24 1826 03/14/24 2334 03/15/24 0301 03/15/24 0830 03/15/24 1705  WBC 5.4 3.9*  --  3.4*  --  3.3*  --   --   NEUTROABS 3.8  --   --   --   --   --   --   --   HGB 10.3* 7.4*   < > 7.4* 7.4* 7.2* 7.2* 7.3*  HCT 33.3* 25.8*   < > 24.3* 24.8* 23.3* 24.2* 24.8*  MCV 98.5 102.4*  --  100.0  --  101.7*  --   --   PLT 143* 118*  --  106*  --  103*  --   --    < > = values in this interval not displayed.    Basic Metabolic Panel: Recent Labs  Lab 03/13/24 2052 03/14/24 0313 03/15/24 0301  NA 137 138 142  K 4.2 3.9 3.6  CL 104 107 110  CO2 21* 22 24  GLUCOSE 155* 138* 101*  BUN 13 13 12   CREATININE 0.61 0.44 0.31*  CALCIUM  8.8* 7.7* 7.8*  MG  --  2.4  --   PHOS  --  3.0  --     GFR: Estimated Creatinine Clearance: 48 mL/min (A) (by C-G formula based on SCr of 0.31 mg/dL (L)).  Liver Function Tests: Recent Labs  Lab 03/13/24 2052  AST 23  ALT 8  ALKPHOS 51  BILITOT 0.3  PROT 6.4*  ALBUMIN 3.8    Recent Results (from the past 240 hours)  MRSA Next Gen by PCR, Nasal     Status: None   Collection Time: 03/14/24  1:28 AM   Specimen: Anterior Nasal Swab  Result Value Ref Range Status   MRSA by PCR Next Gen NOT DETECTED NOT DETECTED Final    Comment: (NOTE) The GeneXpert MRSA Assay (FDA approved for NASAL specimens only), is one component of a comprehensive MRSA colonization surveillance program. It is not intended to  diagnose MRSA infection nor to guide or monitor treatment for MRSA infections. Test performance is not FDA approved in patients less than 61 years old. Performed at [facility], [address], [place], [place] [address]   Resp panel by RT-PCR (RSV, Flu A&B, Covid) Anterior Nasal Swab     Status: Abnormal   Collection Time: 03/14/24 11:01 AM   Specimen: Anterior Nasal Swab  Result Value  Ref Range Status   SARS Coronavirus 2 by RT PCR POSITIVE (A) NEGATIVE Final    Comment: (NOTE) SARS-CoV-2 target nucleic acids are DETECTED.  The SARS-CoV-2 RNA is generally detectable in upper respiratory specimens during the acute phase of infection. Positive results are indicative of the presence of the identified virus, but do not rule out bacterial infection or co-infection with other pathogens not detected by the test. Clinical correlation with patient history and other diagnostic information is necessary to determine patient infection status. The expected result is Negative.  Fact Sheet for Patients: BloggerCourse.com  Fact Sheet for Healthcare Providers: SeriousBroker.it  This test is not yet approved or cleared by the [place]  FDA and  has been authorized for detection and/or diagnosis of SARS-CoV-2 by FDA under an Emergency Use Authorization (EUA).  This EUA will remain in effect (meaning this test can be used) for the duration of  the COVID-19 declaration under Section 564(b)(1) of the A ct, 21 U.S.C. section 360bbb-3(b)(1), unless the authorization is terminated or revoked sooner.     Influenza A by PCR NEGATIVE NEGATIVE Final   Influenza B by PCR NEGATIVE NEGATIVE Final    Comment: (NOTE) The Xpert Xpress SARS-CoV-2/FLU/RSV plus assay is intended as an aid in the diagnosis of influenza from Nasopharyngeal swab specimens and should not be used as a sole basis for treatment. Nasal washings and aspirates are  unacceptable for Xpert Xpress SARS-CoV-2/FLU/RSV testing.  Fact Sheet for Patients: BloggerCourse.com  Fact Sheet for Healthcare Providers: SeriousBroker.it  This test is not yet approved or cleared by the [place]  FDA and has been authorized for detection and/or diagnosis of SARS-CoV-2 by FDA under an Emergency Use Authorization (EUA). This EUA will remain in effect (meaning this test can be used) for the duration of the COVID-19 declaration under Section 564(b)(1) of the Act, 21 U.S.C. section 360bbb-3(b)(1), unless the authorization is terminated or revoked.     Resp Syncytial Virus by PCR NEGATIVE NEGATIVE Final    Comment: (NOTE) Fact Sheet for Patients: BloggerCourse.com  Fact Sheet for Healthcare Providers: SeriousBroker.it  This test is not yet approved or cleared by the [place]  FDA and has been authorized for detection and/or diagnosis of SARS-CoV-2 by FDA under an Emergency Use Authorization (EUA). This EUA will remain in effect (meaning this test can be used) for the duration of the COVID-19 declaration under Section 564(b)(1) of the Act, 21 U.S.C. section 360bbb-3(b)(1), unless the authorization is terminated or revoked.  Performed at [facility], [address], [place], [place] [address]       Radiology Studies: No results found.     LOS: 3 days   Avah Bashor [employer] on [email or web address]  03/16/2024, 8:24 AM

## 2024-03-16 NOTE — Evaluation (Signed)
 Physical Therapy Evaluation Patient Details Name: [name] MRN: [id number] DOB: 1940/07/17 Today's Date: 03/16/2024  History of Present Illness  83 yr old female patient who came  to Ed 03/13/24 [facility] w/ c/o of dark blood stools. Incidentally tested positive for COVID. PMH: COPD on home oxygen  @ 1 L [place], pulm nodule, depression, anxiety, RLS, HTN, GERD, dyslipidemia, Fe def anemia, UGIB Sep 2024 due to gastritis, and LGIB due to diverticulosis s/p coiling.  Clinical Impression  Pt admitted with above diagnosis.  Pt currently with functional limitations due to the deficits listed below (see PT Problem List). Pt will benefit from acute skilled PT to increase their independence and safety with mobility to allow discharge.     The patient is A/O x 4. Patient is ready to mobilize from the  bed to a chair.   Patient reports modified independent with ambulation in her apt, does get some assistance with ADL's , meals in her room.(I feed my mouth and my eyes so I want to eat alone).  Patient stated that she is not ready fro Hospice because she feels better every day and can still do things, get.   This info was passed along to MD, RN and Palliative MD.  Patient will benefit from continued inpatient follow up therapy, <3 hours/day  or return to ALF if staff able to provide the needed assistance, +1  for mobility and  patient will require assistance for ambulation currently and depending on Hospice  at DC.      If plan is discharge home, recommend the following: A lot of help with walking and/or transfers;A lot of help with bathing/dressing/bathroom;Assistance with cooking/housework;Assist for transportation;Help with stairs or ramp for entrance   Can travel by private vehicle   No    Equipment Recommendations None recommended by PT  Recommendations for Other Services       Functional Status Assessment Patient has had a recent decline in their functional status and  demonstrates the ability to make significant improvements in function in a reasonable and predictable amount of time.     Precautions / Restrictions Precautions Precautions: Fall Precaution/Restrictions Comments: on O2, low vision      Mobility  Bed Mobility Overal bed mobility: Needs Assistance Bed Mobility: Supine to Sit     Supine to sit: Mod assist     General bed mobility comments: assist with legs and trunk    Transfers Overall transfer level: Needs assistance Equipment used: Rolling walker (2 wheels) Transfers: Sit to/from Stand, Bed to chair/wheelchair/BSC Sit to Stand: +2 safety/equipment, Min assist   Step pivot transfers: Min assist, +2 safety/equipment       General transfer comment: verbal and visual cues   to step to recliner    Ambulation/Gait                  Stairs            Wheelchair Mobility     Tilt Bed    Modified Rankin (Stroke Patients Only)       Balance Overall balance assessment: Needs assistance Sitting-balance support: Bilateral upper extremity supported, Feet supported Sitting balance-Leahy Scale: Fair     Standing balance support: Bilateral upper extremity supported, During functional activity, Reliant on assistive device for balance Standing balance-Leahy Scale: Poor  Pertinent Vitals/Pain Pain Assessment Pain Assessment: No/denies pain    Home Living Family/patient expects to be discharged to:: Assisted living                 Home Equipment: Agricultural consultant (2 wheels) Additional Comments: pt uses RW, regular toilet with rails    Prior Function Prior Level of Function : Needs assist             Mobility Comments: patient reports indep RW use at ALF ADLs Comments: has assist for bathing and LB self care as well as all IADL's     Extremity/Trunk Assessment   Upper Extremity Assessment Upper Extremity Assessment: Defer to OT evaluation     Lower Extremity Assessment Lower Extremity Assessment: Generalized weakness    Cervical / Trunk Assessment Cervical / Trunk Assessment: Kyphotic  Communication   Communication Communication: Impaired Factors Affecting Communication: Hearing impaired    Cognition Arousal: Alert Behavior During Therapy: WFL for tasks assessed/performed   PT - Cognitive impairments: No apparent impairments                         Following commands: Intact       Cueing Cueing Techniques: Verbal cues     General Comments      Exercises     Assessment/Plan    PT Assessment Patient needs continued PT services  PT Problem List Decreased strength;Decreased activity tolerance;Decreased range of motion;Decreased knowledge of use of DME;Decreased balance;Decreased safety awareness;Decreased knowledge of precautions;Decreased mobility       PT Treatment Interventions DME instruction;Gait training;Functional mobility training;Therapeutic activities;Therapeutic exercise;Patient/family education    PT Goals (Current goals can be found in the Care Plan section)  Acute Rehab PT Goals Patient Stated Goal: to go home PT Goal Formulation: With patient Time For Goal Achievement: 03/30/24 Potential to Achieve Goals: Fair    Frequency Min 2X/week     Co-evaluation               AM-PAC PT 6 Clicks Mobility  Outcome Measure Help needed turning from your back to your side while in a flat bed without using bedrails?: A Little Help needed moving from lying on your back to sitting on the side of a flat bed without using bedrails?: A Lot Help needed moving to and from a bed to a chair (including a wheelchair)?: A Lot Help needed standing up from a chair using your arms (e.g., wheelchair or bedside chair)?: A Lot Help needed to walk in hospital room?: Total Help needed climbing 3-5 steps with a railing? : Total 6 Click Score: 11    End of Session Equipment Utilized During  Treatment: Gait belt Activity Tolerance: Patient tolerated treatment well Patient left: in chair;with call bell/phone within reach;with chair alarm set Nurse Communication: Mobility status PT Visit Diagnosis: Unsteadiness on feet (R26.81);Other abnormalities of gait and mobility (R26.89);Difficulty in walking, not elsewhere classified (R26.2)    Time: 1600-1630 PT Time Calculation (min) (ACUTE ONLY): 30 min   Charges:   PT Evaluation $PT Eval Low Complexity: 1 Low PT Treatments $Therapeutic Activity: 8-22 mins PT General Charges $$ ACUTE PT VISIT: 1 Visit         Darice Potters PT Acute Rehabilitation Services Office [phone]   Potters Darice Norris 03/16/2024, 4:45 PM

## 2024-03-17 DIAGNOSIS — D61818 Other pancytopenia: Secondary | ICD-10-CM | POA: Diagnosis not present

## 2024-03-17 DIAGNOSIS — K922 Gastrointestinal hemorrhage, unspecified: Secondary | ICD-10-CM | POA: Diagnosis not present

## 2024-03-17 LAB — CBC
HCT: 25.1 % — ABNORMAL LOW (ref 36.0–46.0)
HCT: 24.8 % — ABNORMAL LOW (ref 36.0–46.0)
Hemoglobin: 7.6 g/dL — ABNORMAL LOW (ref 12.0–15.0)
Hemoglobin: 7.6 g/dL — ABNORMAL LOW (ref 12.0–15.0)
MCH: 30.5 pg (ref 26.0–34.0)
MCH: 30.5 pg (ref 26.0–34.0)
MCHC: 30.3 g/dL (ref 30.0–36.0)
MCHC: 30.6 g/dL (ref 30.0–36.0)
MCV: 100.8 fL — ABNORMAL HIGH (ref 80.0–100.0)
MCV: 99.6 fL (ref 80.0–100.0)
Platelets: 128 K/uL — ABNORMAL LOW (ref 150–400)
Platelets: 136 K/uL — ABNORMAL LOW (ref 150–400)
RBC: 2.49 MIL/uL — ABNORMAL LOW (ref 3.87–5.11)
RBC: 2.49 MIL/uL — ABNORMAL LOW (ref 3.87–5.11)
RDW: 15.4 % (ref 11.5–15.5)
RDW: 15.2 % (ref 11.5–15.5)
WBC: 3.7 K/uL — ABNORMAL LOW (ref 4.0–10.5)
WBC: 3.6 K/uL — ABNORMAL LOW (ref 4.0–10.5)
nRBC: 0 % (ref 0.0–0.2)
nRBC: 0.6 % — ABNORMAL HIGH (ref 0.0–0.2)

## 2024-03-17 LAB — TYPE AND SCREEN
ABO/RH(D): O POS
ABO/RH(D): O POS
Antibody Screen: POSITIVE
Antibody Screen: NEGATIVE
Donor AG Type: NEGATIVE
Donor AG Type: NEGATIVE
PT AG Type: NEGATIVE
Unit division: 0
Unit division: 0

## 2024-03-17 LAB — DIFFERENTIAL
Abs Immature Granulocytes: 0.04 K/uL (ref 0.00–0.07)
Basophils Absolute: 0 K/uL (ref 0.0–0.1)
Basophils Relative: 0 %
Eosinophils Absolute: 0.1 K/uL (ref 0.0–0.5)
Eosinophils Relative: 3 %
Immature Granulocytes: 1 %
Lymphocytes Relative: 24 %
Lymphs Abs: 0.9 K/uL (ref 0.7–4.0)
Monocytes Absolute: 0.4 K/uL (ref 0.1–1.0)
Monocytes Relative: 12 %
Neutro Abs: 2.2 K/uL (ref 1.7–7.7)
Neutrophils Relative %: 60 %

## 2024-03-17 LAB — BPAM RBC
Blood Product Expiration Date: 202510112359
Blood Product Expiration Date: 202510112359
ISSUE DATE / TIME: 202509140738
Unit Type and Rh: 5100
Unit Type and Rh: 5100

## 2024-03-17 LAB — RETICULOCYTES
Immature Retic Fract: 21.6 % — ABNORMAL HIGH (ref 2.3–15.9)
RBC.: 2.4 MIL/uL — ABNORMAL LOW (ref 3.87–5.11)
Retic Count, Absolute: 43.2 K/uL (ref 19.0–186.0)
Retic Ct Pct: 1.8 % (ref 0.4–3.1)

## 2024-03-17 LAB — TECHNOLOGIST SMEAR REVIEW: Plt Morphology: DECREASED

## 2024-03-17 LAB — VITAMIN B12: Vitamin B-12: 1167 pg/mL — ABNORMAL HIGH (ref 180–914)

## 2024-03-17 LAB — IRON AND TIBC
Iron: 28 ug/dL (ref 28–170)
Saturation Ratios: 11 % (ref 10.4–31.8)
TIBC: 256 ug/dL (ref 250–450)
UIBC: 229 ug/dL

## 2024-03-17 LAB — FOLATE: Folate: 9.3 ng/mL (ref >5.9)

## 2024-03-17 LAB — LACTATE DEHYDROGENASE: LDH: 126 U/L (ref 98–192)

## 2024-03-17 LAB — FERRITIN: Ferritin: 66 ng/mL (ref 11–307)

## 2024-03-17 NOTE — Evaluation (Signed)
 Occupational Therapy Evaluation Patient Details Name: [name] MRN: [id number] DOB: 1940-07-18 Today's Date: 03/17/2024   History of Present Illness   83 yr old female patient who came  to ED on 03/13/24 from ALF at  [facility] due to dark bloody stools. Incidentally tested positive for COVID. PMH: COPD, pulmonary nodule, depression, anxiety, RLS, HTN, GERD, dyslipidemia, iron  deficiency anemia, GI bleed Sep 2024, diverticulosis s/p coiling.     Clinical Impressions The pt is currently presenting below her baseline level of functioning for self care management. She is limited by the below listed deficits (see OT problem list). During the session, she required min assist for supine to sit, for lower body dressing seated EOB, for sit to stand using a RW, and for taking lateral steps along the EOB. She reported feelings of increased weakness and she also appeared to be with general deconditioning. She will benefit from OT services to maximize her independence with ADLs and to decrease the risk for further weakness and deconditioning. Patient will benefit from continued inpatient follow up therapy, <3 hours/day.      If plan is discharge home, recommend the following:   A lot of help with bathing/dressing/bathroom;A little help with walking and/or transfers     Functional Status Assessment   Patient has had a recent decline in their functional status and demonstrates the ability to make significant improvements in function in a reasonable and predictable amount of time.     Equipment Recommendations   Other (comment) (defer to next setting)     Recommendations for Other Services         Precautions/Restrictions   Precautions Precautions: Fall Restrictions Weight Bearing Restrictions Per Provider Order: No     Mobility Bed Mobility Overal bed mobility: Needs Assistance Bed Mobility: Supine to Sit, Sit to Supine     Supine to sit: Min assist, HOB elevated,  Used rails Sit to supine: Min assist        Transfers Overall transfer level: Needs assistance Equipment used: Rolling walker (2 wheels) Transfers: Sit to/from Stand Sit to Stand: Min assist, From elevated surface                  Balance     Sitting balance-Leahy Scale: Good         Standing balance comment: CGA to min assist with RW           ADL either performed or assessed with clinical judgement   ADL Overall ADL's : Needs assistance/impaired Eating/Feeding: Independent;Sitting   Grooming: Set up;Sitting   Upper Body Bathing: Set up;Sitting   Lower Body Bathing: Minimal assistance;Moderate assistance;Sit to/from stand;Sitting/lateral leans   Upper Body Dressing : Set up;Sitting   Lower Body Dressing: Minimal assistance;Sitting/lateral leans   Toilet Transfer: Minimal assistance;Ambulation;Rolling walker (2 wheels)                   Vision Baseline Vision/History: 1 Wears glasses              Pertinent Vitals/Pain Pain Assessment Pain Assessment: No/denies pain     Extremity/Trunk Assessment Upper Extremity Assessment Upper Extremity Assessment: Right hand dominant;RUE deficits/detail;LUE deficits/detail RUE Deficits / Details: chronic AROM limitations of the shoulder. Elbow, wrist and hand AROM WFL. Functional grip strength LUE Deficits / Details: AROM WFL. Functional grip strength   Lower Extremity Assessment Lower Extremity Assessment: LLE deficits/detail;RLE deficits/detail;Generalized weakness RLE Deficits / Details: AROM WFL LLE Deficits / Details: AROM WFL  Communication     Cognition Arousal: Alert Behavior During Therapy: WFL for tasks assessed/performed Cognition: No apparent impairments             OT - Cognition Comments: Oriented to person, place, and month. She had difficulty recalling the year.        Following commands: Intact       Cueing  General Comments   Cueing Techniques: Verbal  cues              Home Living Family/patient expects to be discharged to:: Assisted living ([facility])          Home Equipment: Agricultural consultant (2 wheels)          Prior Functioning/Environment Prior Level of Function : Independent/Modified Independent             Mobility Comments:  (She used a RW for ambulation.) ADLs Comments:  (She reported being modified independent to independent with ADLs.)    OT Problem List: Decreased strength;Decreased activity tolerance;Impaired balance (sitting and/or standing);Cardiopulmonary status limiting activity   OT Treatment/Interventions: Self-care/ADL training;Therapeutic exercise;Therapeutic activities;Energy conservation;Patient/family education;DME and/or AE instruction;Balance training      OT Goals(Current goals can be found in the care plan section)   Acute Rehab OT Goals Patient Stated Goal: to get stronger and return to [facility] OT Goal Formulation: With patient Time For Goal Achievement: 03/31/24 Potential to Achieve Goals: Good ADL Goals Pt Will Perform Grooming: with set-up;standing;with supervision Pt Will Perform Lower Body Dressing: with supervision;with set-up;sit to/from stand;sitting/lateral leans Pt Will Transfer to Toilet: with supervision;ambulating Pt Will Perform Toileting - Clothing Manipulation and hygiene: with supervision;sit to/from stand   OT Frequency:  Min 2X/week       AM-PAC OT 6 Clicks Daily Activity     Outcome Measure Help from another person eating meals?: None Help from another person taking care of personal grooming?: A Little Help from another person toileting, which includes using toliet, bedpan, or urinal?: A Lot Help from another person bathing (including washing, rinsing, drying)?: A Lot Help from another person to put on and taking off regular upper body clothing?: A Little Help from another person to put on and taking off regular lower body clothing?: A Little 6  Click Score: 17   End of Session Equipment Utilized During Treatment: Rolling walker (2 wheels) Nurse Communication: Mobility status  Activity Tolerance: Patient tolerated treatment well Patient left: in bed;with call bell/phone within reach;with bed alarm set  OT Visit Diagnosis: Unsteadiness on feet (R26.81);Other abnormalities of gait and mobility (R26.89);Muscle weakness (generalized) (M62.81)                Time: 8344-8282 OT Time Calculation (min): 22 min Charges:  OT General Charges $OT Visit: 1 Visit OT Evaluation $OT Eval Moderate Complexity: 1 Mod    Delanna JINNY Lesches, OTR/L 03/17/2024, 5:47 PM

## 2024-03-17 NOTE — Plan of Care (Signed)

## 2024-03-17 NOTE — TOC Progression Note (Addendum)
 Transition of Care [facility]) - Progression Note    Patient Details  Name: [name] MRN: [id number] Date of Birth: August 20, 1940  [facility] CM/SW Contact  Doneta Glenys DASEN, RN Phone Number: 03/17/2024, 11:25 AM  Clinical Narrative:    Patient andd husband has had a change of heart about Hospice Care. Wanted to change to SNF because aptient is getting strong every today. MDs updated via chat. CM spoke with [facility]. Lonell states that patient can return to [facility] for SN -Rehab and no hold for testing positive for COVID. Will need a PASRR and FL2 to return. CM will start Availity auth. 12:16 PM PASRR came back as Level 2. Requested documents (H&P, Fl2 & 30 day note) sent to  Must. Must ID [id number] 4:08 PM PASRR# [id number]  03/17/2024 - 04/16/2024  Expected Discharge Plan: Assisted Living Barriers to Discharge: Continued Medical Work up               Expected Discharge Plan and Services In-house Referral: NA Discharge Planning Services: CM Consult Post Acute Care Choice: NA Living arrangements for the past 2 months: Assisted Living Facility                 DME Arranged: N/A DME Agency: NA       HH Arranged: NA HH Agency: NA         Social Drivers of Health (SDOH) Interventions SDOH Screenings   Food Insecurity: No Food Insecurity (03/14/2024)  Housing: Low Risk  (03/14/2024)  Transportation Needs: No Transportation Needs (03/14/2024)  Utilities: Not At Risk (03/14/2024)  Alcohol  Screen: Low Risk  (04/04/2023)  Depression (PHQ2-9): Low Risk  (03/04/2024)  Financial Resource Strain: Low Risk  (04/04/2023)  Physical Activity: Insufficiently Active (04/04/2023)  Social Connections: Socially Integrated (03/14/2024)  Stress: No Stress Concern Present (04/04/2023)  Tobacco Use: Medium Risk (03/14/2024)  Health Literacy: Adequate Health Literacy (04/04/2023)    Readmission Risk Interventions    03/15/2024   11:12 AM 01/01/2024   11:17 AM  11/19/2023   11:31 AM  Readmission Risk Prevention Plan  Transportation Screening Complete Complete Complete  PCP or Specialist Appt within 5-7 Days   Complete  PCP or Specialist Appt within 3-5 Days Complete Complete   Home Care Screening   Complete  Medication Review (RN CM)   Complete  HRI or Home Care Consult Complete Complete   Social Work Consult for Recovery Care Planning/Counseling Complete Complete   Palliative Care Screening Not Applicable Not Applicable   Medication Review Oceanographer) Complete Complete

## 2024-03-17 NOTE — Progress Notes (Addendum)
 PROGRESS NOTE   [name]  FMW:[id number]    DOB: Nov 08, 1940    DOA: 03/13/2024  PCP: Gil Greig BRAVO, NP   I have briefly reviewed patients previous medical records in [facility].   Brief Hospital Course:   83 yr old female patient, resides at [facility], with COPD on home oxygen  @ 1 L [place], pulm nodule, depression, anxiety, RLS, HTN, GERD, dyslipidemia, Fe def anemia, UGIB Sep 2024 due to gastritis, and LGIB due to diverticulosis s/p coiling, [facility]/o of dark blood stools.  Incidentally tested positive for COVID.  She was hospitalized for further management.  Bleeding has clinically resolved.  Hemoglobin stable.  Plan for DC back to SNF pending insurance authorization.   Assessment & Plan:   Lower GI bleed Came in with rectal bleeding.  She has a history of lower GI bleed due to diverticulosis in the past and is status post coiling in June 2025.  Patient was admitted to the ICU.  Was seen by interventional radiology and underwent mesenteric angiogram to attempt another coiling but no active bleeding was noted during this angiogram. Hemoglobin was closely monitored through course of hospital, dropped to low of 6.9 on 9/14, was transfused PRBC and since then hemoglobin stable in the low 7 g range through 9/15. In the interim patient had decided to forego further treatments and testings.  She appears to have opted for comfort care, transition to hospice.  No further lab draws were done. Subsequently, on 9/16, she apparently felt stronger and participated with PT and reportedly changed her mind regarding comfort measures.  As per communication with the palliative care team, plan is to discharge to SNF with palliative care following. I discussed in detail with patient's MD son, rechecked CBC and ensured hemoglobin stability/hemoglobin up to 7.6.   Acute blood loss anemia Pancytopenia Iron  deficiency anemia Back in July hemoglobin was 8.5.  Came in with  a hemoglobin of 10.3 which was likely hemoconcentrated.  Next hemoglobin was 7.4.  Dropped down to 6.9.  She was transfused PRBC.  Hemoglobin 7.6 on 9/17. Although she has had some thrombocytopenia at least since June 2025, leukopenia appears new. Anemia panel 12/28/2023: Iron  231, TIBC 273, saturation ratio 85, ferritin 27.  B12 on 07/14/2023: 373. Consider outpatient hematology consultation.  Addendum: With updated CBC results from today, discussed again with patient's MD son who requested at least getting hematology to see her while here and then they could follow her in the office.  Plan to request hematology consultation early tomorrow morning.  Requested anemia panel, repeat CBC and CMP for tomorrow, LDH, peripheral smear.   History of COPD/chronic respiratory failure with hypoxia Stable.   COVID-19 infection Incidentally positive for COVID.  Does not appear to have significant symptoms from the same.  No indication for COVID-specific treatments.   History of depression anxiety Stable.  Continue home medications.   Essential hypertension Soft blood pressures noted.  Not noted to be on any antihypertensives.   Glaucoma Continue eyedrops.   Goals of care Palliative care medicine on board.  Goals of care as noted above.  Communicated with Dr. Jeryl.   Body mass index is 32.87 kg/m./Class I obesity Complicates care.  Outpatient follow-up.   DVT prophylaxis: SCDs Start: 03/13/24 2324     Code Status: Do not attempt resuscitation (DNR) - Comfort care:  Family Communication: Discussed in detail with patient's son who is an MD via phone, updated care  and answered all questions. Disposition:  Status is: Inpatient Remains inpatient appropriate because: Medically stable for DC to SNF pending insurance authorization.     Consultants:   Palliative care medicine  Procedures:     Subjective:  Patient is a poor historian.  Hard of hearing.  Denies complaints.  States that she just  wants to get out of bed.  No pain, nausea, vomiting, dyspnea or chest pain reported.  As per RN, no acute issues reported.  Objective:   Vitals:   03/16/24 2010 03/17/24 0531 03/17/24 0706 03/17/24 1302  BP: (!) 106/53 (!) 109/53  (!) 104/52  Pulse: 80 78  72  Resp: 20 18  16   Temp: 97.9 F (36.6 C) 98.3 F (36.8 C)  98.5 F (36.9 C)  TempSrc: Oral     SpO2: 98% 99%  98%  Weight:   78.9 kg   Height:        General exam: Elderly female, moderately built and frail lying comfortably propped up in bed without distress.  Has [place] oxygen  on. Respiratory system: Clear to auscultation. Respiratory effort normal. Cardiovascular system: S1 & S2 heard, RRR. No JVD, murmurs, rubs, gallops or clicks. No pedal edema. Gastrointestinal system: Abdomen is nondistended, soft and nontender. No organomegaly or masses felt. Normal bowel sounds heard. Central nervous system: Alert and oriented x 2. No focal neurological deficits. Extremities: Symmetric 5 x 5 power. Skin: No rashes, lesions or ulcers Psychiatry: Judgement and insight appear somewhat impaired. Mood & affect appropriate.     Data Reviewed:   I have personally reviewed following labs and imaging studies   CBC: Recent Labs  Lab 03/13/24 2052 03/14/24 0313 03/14/24 1826 03/14/24 2334 03/15/24 0301 03/15/24 0830 03/15/24 1705 03/17/24 1331  WBC 5.4   < > 3.4*  --  3.3*  --   --  3.7*  NEUTROABS 3.8  --   --   --   --   --   --   --   HGB 10.3*   < > 7.4*   < > 7.2* 7.2* 7.3* 7.6*  HCT 33.3*   < > 24.3*   < > 23.3* 24.2* 24.8* 25.1*  MCV 98.5   < > 100.0  --  101.7*  --   --  100.8*  PLT 143*   < > 106*  --  103*  --   --  128*   < > = values in this interval not displayed.    Basic Metabolic Panel: Recent Labs  Lab 03/13/24 2052 03/14/24 0313 03/15/24 0301  NA 137 138 142  K 4.2 3.9 3.6  CL 104 107 110  CO2 21* 22 24  GLUCOSE 155* 138* 101*  BUN 13 13 12   CREATININE 0.61 0.44 0.31*  CALCIUM  8.8* 7.7* 7.8*  MG  --   2.4  --   PHOS  --  3.0  --     Liver Function Tests: Recent Labs  Lab 03/13/24 2052  AST 23  ALT 8  ALKPHOS 51  BILITOT 0.3  PROT 6.4*  ALBUMIN 3.8    CBG: No results for input(s): GLUCAP in the last 168 hours.  Microbiology Studies:   Recent Results (from the past 240 hours)  MRSA Next Gen by PCR, Nasal     Status: None   Collection Time: 03/14/24  1:28 AM   Specimen: Anterior Nasal Swab  Result Value Ref Range Status   MRSA by PCR Next Gen NOT DETECTED NOT DETECTED Final  Comment: (NOTE) The GeneXpert MRSA Assay (FDA approved for NASAL specimens only), is one component of a comprehensive MRSA colonization surveillance program. It is not intended to diagnose MRSA infection nor to guide or monitor treatment for MRSA infections. Test performance is not FDA approved in patients less than 51 years old. Performed at [facility], [address] [place], [place] [address]   Resp panel by RT-PCR (RSV, Flu A&B, Covid) Anterior Nasal Swab     Status: Abnormal   Collection Time: 03/14/24 11:01 AM   Specimen: Anterior Nasal Swab  Result Value Ref Range Status   SARS Coronavirus 2 by RT PCR POSITIVE (A) NEGATIVE Final    Comment: (NOTE) SARS-CoV-2 target nucleic acids are DETECTED.  The SARS-CoV-2 RNA is generally detectable in upper respiratory specimens during the acute phase of infection. Positive results are indicative of the presence of the identified virus, but do not rule out bacterial infection or co-infection with other pathogens not detected by the test. Clinical correlation with patient history and other diagnostic information is necessary to determine patient infection status. The expected result is Negative.  Fact Sheet for Patients: BloggerCourse.com  Fact Sheet for Healthcare Providers: SeriousBroker.it  This test is not yet approved or cleared by the [place]  FDA and  has  been authorized for detection and/or diagnosis of SARS-CoV-2 by FDA under an Emergency Use Authorization (EUA).  This EUA will remain in effect (meaning this test can be used) for the duration of  the COVID-19 declaration under Section 564(b)(1) of the A ct, 21 U.S.C. section 360bbb-3(b)(1), unless the authorization is terminated or revoked sooner.     Influenza A by PCR NEGATIVE NEGATIVE Final   Influenza B by PCR NEGATIVE NEGATIVE Final    Comment: (NOTE) The Xpert Xpress SARS-CoV-2/FLU/RSV plus assay is intended as an aid in the diagnosis of influenza from Nasopharyngeal swab specimens and should not be used as a sole basis for treatment. Nasal washings and aspirates are unacceptable for Xpert Xpress SARS-CoV-2/FLU/RSV testing.  Fact Sheet for Patients: BloggerCourse.com  Fact Sheet for Healthcare Providers: SeriousBroker.it  This test is not yet approved or cleared by the [place]  FDA and has been authorized for detection and/or diagnosis of SARS-CoV-2 by FDA under an Emergency Use Authorization (EUA). This EUA will remain in effect (meaning this test can be used) for the duration of the COVID-19 declaration under Section 564(b)(1) of the Act, 21 U.S.C. section 360bbb-3(b)(1), unless the authorization is terminated or revoked.     Resp Syncytial Virus by PCR NEGATIVE NEGATIVE Final    Comment: (NOTE) Fact Sheet for Patients: BloggerCourse.com  Fact Sheet for Healthcare Providers: SeriousBroker.it  This test is not yet approved or cleared by the [place]  FDA and has been authorized for detection and/or diagnosis of SARS-CoV-2 by FDA under an Emergency Use Authorization (EUA). This EUA will remain in effect (meaning this test can be used) for the duration of the COVID-19 declaration under Section 564(b)(1) of the Act, 21 U.S.C. section 360bbb-3(b)(1), unless the  authorization is terminated or revoked.  Performed at [facility], [address] [place], [place] [address]     Radiology Studies:  No results found.  Scheduled Meds:    Chlorhexidine  Gluconate Cloth  6 each Topical Daily   cyanocobalamin   1,000 mcg Oral Daily   dorzolamide -timolol   1 drop Both Eyes Daily   ferrous gluconate   324 mg Oral Q breakfast   latanoprost   1 drop Both Eyes QHS  multivitamin  1 tablet Oral Daily   Netarsudil  Dimesylate  1 drop Both Eyes QHS   oxybutynin   10 mg Oral QHS   pantoprazole   40 mg Oral BID AC   sertraline   75 mg Oral Daily   umeclidinium-vilanterol  1 puff Inhalation Daily    Continuous Infusions:     LOS: 4 days     Trenda Mar, MD,  FACP, [facility], [facility], [facility]   [facility] Ad[facility] [place]      To contact the attending provider between 7A-7P or the covering provider during after hours 7P-7A, please log into the web site [email or web address] and access using universal [place] password for that web site. If you do not have the password, please call the hospital operator.  03/17/2024, 4:16 PM

## 2024-03-17 NOTE — TOC PASRR Note (Signed)
 30 Day PASRR Note   Patient Details  Name: [name] Date of Birth: 1940-08-22   Transition of Care [facility]) CM/SW Contact:    Doneta Glenys DASEN, RN Phone Number: 03/17/2024, 11:23 AM  To Whom It May Concern:  Please be advised that this patient will require a short-term nursing home stay - anticipated 30 days or less for rehabilitation and strengthening.   The plan is for return home.

## 2024-03-17 NOTE — NC FL2 (Signed)
 [place]  MEDICAID FL2 LEVEL OF CARE FORM     IDENTIFICATION  Patient Name: [name] Birthdate: 01-24-1941 Sex: female Admission Date (Current Location): 03/13/2024  [facility] and [place] Number:  Producer, television/film/video and Address:  [facility],  [address], [place] [address]      Provider Number: [id number]  Attending Physician Name and Address:  Judeth Trenda BIRCH, MD  Relative Name and Phone Number:       Current Level of Care: Hospital Recommended Level of Care: Skilled Nursing Facility Prior Approval Number:    Date Approved/Denied:   PASRR Number:    Discharge Plan: SNF    Current Diagnoses: Patient Active Problem List   Diagnosis Date Noted   Lower gastrointestinal bleeding 03/13/2024   Hypokalemia 12/28/2023   Acute on chronic blood loss anemia 12/27/2023   Acute lower GI bleeding 12/27/2023   Closed compression fracture of body of L1 vertebra (HCC) 12/09/2023   Urinary incontinence 12/05/2023   Acute GI bleeding 11/18/2023   Ischemic colitis (HCC) 10/27/2023   Acute upper GI bleed 10/25/2023   Edema of right lower extremity 10/25/2023   Chronic heart failure with preserved ejection fraction (HFpEF) (HCC) 10/25/2023   UGI bleed 04/19/2023   Recurrent depression (HCC) 04/13/2023   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   History of GI diverticular bleed 03/28/2023   GAD (generalized anxiety disorder) 03/28/2023   Diastolic heart failure (HCC) 03/19/2023   Class 1 obesity 03/19/2023   Thoracic aorta atherosclerosis (HCC) 03/19/2023   Melena 01/05/2023   Hypotension 05/02/2022   UTI (urinary tract infection) 02/05/2022   Restless leg syndrome 02/05/2022   Senile osteoporosis 02/05/2022   Hypertension 02/05/2022   Pulmonary nodule 1 cm or greater in diameter 02/05/2022   GERD (gastroesophageal reflux disease) 02/05/2022   Upper GI bleed 01/07/2022   Iron  deficiency anemia 05/02/2021   Chronic anemia    DOE (dyspnea  on exertion) 11/18/2017   COPD (chronic obstructive pulmonary disease) (HCC) 11/18/2017   Diverticulosis of intestine with bleeding 09/05/2017   Acute respiratory failure with hypoxia (HCC) 08/07/2017   Depression with anxiety 08/07/2017   Hyperlipidemia 08/07/2017   Macular degeneration 08/07/2017   Glaucoma 08/07/2017   Weakness 08/07/2017   Dehydration 08/07/2017   Lower GI bleeding 10/14/2016   Diverticulosis of colon 10/14/2016   Anxiety 10/14/2016   Lower GI bleed 10/14/2016   GI bleed    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   Dermatochalasis of both upper eyelids 08/17/2014   Osteoarthritis, multiple sites 12/20/2011    Orientation RESPIRATION BLADDER Height & Weight     Self, Time, Situation, Place  Normal External catheter Weight: 78.9 kg Height:  5' 1 (154.9 cm)  BEHAVIORAL SYMPTOMS/MOOD NEUROLOGICAL BOWEL NUTRITION STATUS      Continent Diet  AMBULATORY STATUS COMMUNICATION OF NEEDS Skin   Extensive Assist Verbally Normal                       Personal Care Assistance Level of Assistance  Bathing, Feeding, Dressing Bathing Assistance: Limited assistance   Dressing Assistance: Limited assistance     Functional Limitations Info  Sight, Hearing Sight Info: Impaired Hearing Info: Impaired (Hearing aids)      SPECIAL CARE FACTORS FREQUENCY  PT (By licensed PT), OT (By licensed OT)     PT Frequency: 5x weekly OT Frequency: 5x weekly            Contractures Contractures Info:  Not present    Additional Factors Info  Code Status, Allergies, Psychotropic Code Status Info: DNR - Comfort Allergies Info: Morphine And Codeine, Aspirin, Duloxetine  Hcl, Tymlos (Abaloparatide), Ventolin  (Albuterol ), Cefadroxil Psychotropic Info: Sertraline          Current Medications (03/17/2024):  This is the current hospital active medication list Current Facility-Administered Medications  Medication Dose Route Frequency Provider Last Rate Last Admin    acetaminophen  (TYLENOL ) tablet 650 mg  650 mg Oral Q4H PRN Krishnan, Gokul, MD       Chlorhexidine  Gluconate Cloth 2 % PADS 6 each  6 each Topical Daily Albustami, Omar M, MD   6 each at 03/17/24 1033   cyanocobalamin  (VITAMIN B12) tablet 1,000 mcg  1,000 mcg Oral Daily Krishnan, Gokul, MD   1,000 mcg at 03/17/24 1005   docusate sodium  (COLACE) capsule 100 mg  100 mg Oral BID PRN Albustami, Omar M, MD       dorzolamide -timolol  (COSOPT ) 2-0.5 % ophthalmic solution 1 drop  1 drop Both Eyes Daily Alghanim, Fahid, MD   1 drop at 03/17/24 1016   ferrous gluconate  (FERGON) tablet 324 mg  324 mg Oral Q breakfast Alghanim, Fahid, MD   324 mg at 03/17/24 1005   ipratropium-albuterol  (DUONEB) 0.5-2.5 (3) MG/3ML nebulizer solution 3 mL  3 mL Nebulization Q6H PRN [place], Paula B, NP       latanoprost  (XALATAN ) 0.005 % ophthalmic solution 1 drop  1 drop Both Eyes QHS Antonetta Moccasin B, NP   1 drop at 03/16/24 2156   multivitamin (PROSIGHT) tablet 1 tablet  1 tablet Oral Daily Alghanim, Fahid, MD   1 tablet at 03/17/24 1005   Netarsudil  Dimesylate 0.02 % SOLN 1 drop  1 drop Both Eyes QHS [place], Paula B, NP       oxybutynin  (DITROPAN -XL) 24 hr tablet 10 mg  10 mg Oral QHS [place], Paula B, NP   10 mg at 03/16/24 2153   pantoprazole  (PROTONIX ) EC tablet 40 mg  40 mg Oral BID AC Krishnan, Gokul, MD   40 mg at 03/17/24 1005   polyethylene glycol (MIRALAX  / GLYCOLAX ) packet 17 g  17 g Oral Daily PRN Albustami, Omar M, MD       rOPINIRole  (REQUIP ) tablet 0.5 mg  0.5 mg Oral Daily PRN [place], Paula B, NP       sertraline  (ZOLOFT ) tablet 75 mg  75 mg Oral Daily [place], Paula B, NP   75 mg at 03/17/24 1005   umeclidinium-vilanterol (ANORO ELLIPTA ) 62.5-25 MCG/ACT 1 puff  1 puff Inhalation Daily Antonetta Moccasin B, NP   1 puff at 03/16/24 9075     Discharge Medications: Please see discharge summary for a list of discharge medications.  Relevant Imaging Results:  Relevant Lab Results:   Additional  Information SSN [id number]  Doneta Glenys DASEN, RN

## 2024-03-17 NOTE — Plan of Care (Signed)
  Problem: Education: Goal: Knowledge of General Education information will improve Description: Including pain rating scale, medication(s)/side effects and non-pharmacologic comfort measures Outcome: Progressing   Problem: Health Behavior/Discharge Planning: Goal: Ability to manage health-related needs will improve Outcome: Progressing   Problem: Clinical Measurements: Goal: Ability to maintain clinical measurements within normal limits will improve Outcome: Progressing Goal: Will remain free from infection Outcome: Progressing Goal: Diagnostic test results will improve Outcome: Progressing Goal: Respiratory complications will improve Outcome: Progressing Goal: Cardiovascular complication will be avoided Outcome: Progressing   Problem: Activity: Goal: Risk for activity intolerance will decrease Outcome: Progressing   Problem: Coping: Goal: Level of anxiety will decrease Outcome: Progressing   Problem: Elimination: Goal: Will not experience complications related to bowel motility Outcome: Progressing Goal: Will not experience complications related to urinary retention Outcome: Progressing   Problem: Pain Managment: Goal: General experience of comfort will improve and/or be controlled Outcome: Progressing   Problem: Safety: Goal: Ability to remain free from injury will improve Outcome: Progressing   Problem: Skin Integrity: Goal: Risk for impaired skin integrity will decrease Outcome: Progressing   Problem: Education: Goal: Understanding of CV disease, CV risk reduction, and recovery process will improve Outcome: Progressing Goal: Individualized Educational Video(s) Outcome: Progressing   Problem: Activity: Goal: Ability to return to baseline activity level will improve Outcome: Progressing   Problem: Cardiovascular: Goal: Ability to achieve and maintain adequate cardiovascular perfusion will improve Outcome: Progressing Goal: Vascular access site(s) Level  0-1 will be maintained Outcome: Progressing   Problem: Health Behavior/Discharge Planning: Goal: Ability to safely manage health-related needs after discharge will improve Outcome: Progressing

## 2024-03-18 DIAGNOSIS — D696 Thrombocytopenia, unspecified: Secondary | ICD-10-CM | POA: Diagnosis not present

## 2024-03-18 DIAGNOSIS — D649 Anemia, unspecified: Secondary | ICD-10-CM | POA: Diagnosis not present

## 2024-03-18 DIAGNOSIS — K922 Gastrointestinal hemorrhage, unspecified: Secondary | ICD-10-CM | POA: Diagnosis not present

## 2024-03-18 DIAGNOSIS — D61818 Other pancytopenia: Secondary | ICD-10-CM | POA: Diagnosis not present

## 2024-03-18 LAB — CBC WITH DIFFERENTIAL/PLATELET
Abs Immature Granulocytes: 0.02 K/uL (ref 0.00–0.07)
Basophils Absolute: 0 K/uL (ref 0.0–0.1)
Basophils Relative: 0 %
Eosinophils Absolute: 0.2 K/uL (ref 0.0–0.5)
Eosinophils Relative: 5 %
HCT: 24.2 % — ABNORMAL LOW (ref 36.0–46.0)
Hemoglobin: 7.6 g/dL — ABNORMAL LOW (ref 12.0–15.0)
Immature Granulocytes: 1 %
Lymphocytes Relative: 27 %
Lymphs Abs: 1 K/uL (ref 0.7–4.0)
MCH: 31.3 pg (ref 26.0–34.0)
MCHC: 31.4 g/dL (ref 30.0–36.0)
MCV: 99.6 fL (ref 80.0–100.0)
Monocytes Absolute: 0.4 K/uL (ref 0.1–1.0)
Monocytes Relative: 11 %
Neutro Abs: 2 K/uL (ref 1.7–7.7)
Neutrophils Relative %: 56 %
Platelets: 146 K/uL — ABNORMAL LOW (ref 150–400)
RBC: 2.43 MIL/uL — ABNORMAL LOW (ref 3.87–5.11)
RDW: 15.3 % (ref 11.5–15.5)
WBC: 3.7 K/uL — ABNORMAL LOW (ref 4.0–10.5)
nRBC: 0 % (ref 0.0–0.2)

## 2024-03-18 LAB — COMPREHENSIVE METABOLIC PANEL WITH GFR
ALT: 6 U/L (ref 0–44)
AST: 19 U/L (ref 15–41)
Albumin: 3.2 g/dL — ABNORMAL LOW (ref 3.5–5.0)
Alkaline Phosphatase: 41 U/L (ref 38–126)
Anion gap: 10 (ref 5–15)
BUN: 7 mg/dL — ABNORMAL LOW (ref 8–23)
CO2: 26 mmol/L (ref 22–32)
Calcium: 8.3 mg/dL — ABNORMAL LOW (ref 8.9–10.3)
Chloride: 108 mmol/L (ref 98–111)
Creatinine, Ser: 0.31 mg/dL — ABNORMAL LOW (ref 0.44–1.00)
GFR, Estimated: >60 mL/min (ref >60)
Glucose, Bld: 105 mg/dL — ABNORMAL HIGH (ref 70–99)
Potassium: 3.6 mmol/L (ref 3.5–5.1)
Sodium: 143 mmol/L (ref 135–145)
Total Bilirubin: 0.3 mg/dL (ref 0.0–1.2)
Total Protein: 5.2 g/dL — ABNORMAL LOW (ref 6.5–8.1)

## 2024-03-18 MED ORDER — TYLENOL 325 MG PO CAPS: 650.0000 mg | ORAL_CAPSULE | Freq: Four times a day (QID) | ORAL | Status: AC | PRN

## 2024-03-18 NOTE — TOC Transition Note (Signed)
 Transition of Care [facility]) - Discharge Note   Patient Details  Name: [name] MRN: [id number] Date of Birth: 1940/12/20  Transition of Care [facility]) CM/SW Contact:  Doneta Glenys DASEN, RN Phone Number: 03/18/2024, 2:59 PM   Clinical Narrative:     Per MD patient is ready for discharge to [facility]. The nurse will call report to [phone] Rm 25 prior to transfer. The patient, patients husband [name], and facility updated about transfer. The discharge packet includes facesheet, medical necessity, discharge summary, FL2, and signed DNR and placed on shadow chart. PTAR has been called  Final next level of care: Skilled Nursing Facility Barriers to Discharge: Barriers Resolved   Patient Goals and CMS Choice Patient states their goals for this hospitalization and ongoing recovery are:: To return to ALF CMS Medicare.gov Compare Post Acute Care list provided to:: Other (Comment Required) (NA) Choice offered to / list presented to : NA [place] ownership interest in [facility].provided to:: Parent NA    Discharge Placement   Existing PASRR number confirmed : 03/18/24          Patient chooses bed at: [facility] Patient to be transferred to facility by: [facility]      Discharge Plan and Services Additional resources added to the After Visit Summary for   In-house Referral: NA Discharge Planning Services: CM Consult Post Acute Care Choice: NA          DME Arranged: N/A DME Agency: NA       HH Arranged: NA HH Agency: NA        Social Drivers of Health (SDOH) Interventions SDOH Screenings   Food Insecurity: No Food Insecurity (03/14/2024)  Housing: Low Risk  (03/14/2024)  Transportation Needs: No Transportation Needs (03/14/2024)  Utilities: Not At Risk (03/14/2024)  Alcohol  Screen: Low Risk  (04/04/2023)  Depression (PHQ2-9): Low Risk  (03/04/2024)  Financial Resource Strain: Low Risk  (04/04/2023)  Physical Activity: Insufficiently Active  (04/04/2023)  Social Connections: Socially Integrated (03/14/2024)  Stress: No Stress Concern Present (04/04/2023)  Tobacco Use: Medium Risk (03/14/2024)  Health Literacy: Adequate Health Literacy (04/04/2023)     Readmission Risk Interventions    03/15/2024   11:12 AM 01/01/2024   11:17 AM 11/19/2023   11:31 AM  Readmission Risk Prevention Plan  Transportation Screening Complete Complete Complete  PCP or Specialist Appt within 5-7 Days   Complete  PCP or Specialist Appt within 3-5 Days Complete Complete   Home Care Screening   Complete  Medication Review (RN CM)   Complete  HRI or Home Care Consult Complete Complete   Social Work Consult for Recovery Care Planning/Counseling Complete Complete   Palliative Care Screening Not Applicable Not Applicable   Medication Review Oceanographer) Complete Complete

## 2024-03-18 NOTE — Progress Notes (Signed)
 The patient has met all goals per MD orders and is ready for discharge. Patient in agreement, being discharged to [facility] under care of physician. Discharge summary paperwork in packet for facility. Patient left with all belongings in stable condition via PTAR. Report called to [facility] LPN.

## 2024-03-18 NOTE — Discharge Summary (Addendum)
 Physician Discharge Summary  [name] FMW:[id number] DOB: [birth year]  PCP: Gil Greig BRAVO, NP  Admitted from: SNF Discharged to: SNF  Admit date: 03/13/2024 Discharge date: 03/18/2024  Recommendations for Outpatient Follow-up:    Contact information for follow-up providers     MD at SNF. Schedule an appointment as soon as possible for a visit.   Why: To be seen in 3 to 4 days with repeat labs (CBC with differential and CMP).        Gil Greig BRAVO, NP. Schedule an appointment as soon as possible for a visit.   Specialty: Adult Health Nurse Practitioner Contact information: [address] [place] [address] [phone]                            Home Health: None    Equipment/Devices: TBD at [facility]    Discharge Condition: Improved and stable.   Code Status: Do not attempt resuscitation (DNR) - Comfort care Diet recommendation:  Discharge Diet Orders (From admission, onward)     Start     Ordered   03/18/24 0000  Diet - low sodium heart healthy        03/18/24 1356             Discharge Diagnoses:  Principal Problem:   Lower gastrointestinal bleeding Active Problems:   Acute on chronic anemia   Thrombocytopenia (HCC)   Brief Summary: Brief Hospital Course:   83 yr old female patient, resides at [facility] [place], with COPD on home oxygen  @ 1 L [place], pulm nodule, depression, anxiety, RLS, HTN, GERD, dyslipidemia, Fe def anemia, UGIB Sep 2024 due to gastritis, and LGIB due to diverticulosis s/p coiling, who [facility]/ c/o of dark blood stools.  Incidentally tested positive for COVID.  She was hospitalized for further management.  Bleeding has clinically resolved.  Hemoglobin stable.  Hematology consulted for pancytopenia, no further workup recommended in house.     Assessment & Plan:    Lower GI bleed Came in with rectal bleeding.  She has a history of lower GI bleed due to diverticulosis in the past and is status post  coiling in June 2025.  Patient was admitted to the ICU.  Was seen by interventional radiology and underwent mesenteric angiogram to attempt another coiling but no active bleeding was noted during this angiogram. Hemoglobin was closely monitored through course of hospital, dropped to low of 6.9 on 9/14, was transfused PRBC and since then hemoglobin stable in the low 7 g range through 9/15. In the interim patient had decided to forego further treatments and testings.  She had opted for comfort care, and transition to hospice.  No further lab draws were thereby done. Subsequently, on 9/16, she apparently felt stronger and participated with PT and reportedly changed her mind regarding comfort measures.  As per communication with the palliative care team, plan is to discharge to SNF with palliative care following. I discussed in detail with patient's MD son, rechecked CBC and ensured hemoglobin stability/hemoglobin up to 7.6 >7 0.6 > 7.6.   Acute blood loss anemia Pancytopenia Iron  deficiency anemia Back in July hemoglobin was 8.5.  Came in with a hemoglobin of 10.3 which was likely hemoconcentrated.  Next hemoglobin was 7.4.  Dropped down to 6.9.  She was transfused PRBC.  Hemoglobin 7.6 on 9/17. Although she has had some thrombocytopenia at least since June 2025, leukopenia appears new. Anemia panel  12/28/2023: Iron  231, TIBC 273, saturation ratio 85, ferritin 27.  B12 on 07/14/2023: 373. Consider outpatient hematology consultation.  9/17: With updated CBC results from today, discussed again with patient's MD son who requested at least getting hematology to see her while here and then they could follow her in the office.    Anemia panel: LDH 126, iron  28, TIBC 256, saturation ratio 11, ferritin 66, folate 9.3, vitamin B12: 1167, absolute reticulocyte count 43.2. Hematology was consulted.  Communicated with Dr. Tina who feels that her acute anemia was from GI bleed as which has resolved.  She had COVID  which could cause myelosuppression at times.  Her WBC is slightly low, normal differentials including normal ANC.  She has chronic borderline thrombocytopenia and they do not feel that these are of concern. They recommend follow-up with PCP at least 4 to 6 weeks postinfection with repeat CBC.  If continued issues then may refer to hematology for further evaluation as outpatient.  Please refer to the detailed consultation note from day of discharge. Continue PTA iron  and B12 supplements.   History of COPD/chronic respiratory failure with hypoxia Stable.  Remains on 1 L/min [place] oxygen .  Titrate or wean off oxygen  at SNF as tolerated.   COVID-19 infection Incidentally positive for COVID.  Does not appear to have significant symptoms from the same.  No indication for COVID-specific treatments.  Appears that she had Paxlovid  PTA and not sure if she completed course.  No reason to continue at this time.   History of depression anxiety Stable.  Continue home medications.   Essential hypertension Soft blood pressures noted intermittently.  Not noted to be on any antihypertensives.   Glaucoma Continue eyedrops.   Goals of care Palliative care medicine on board.  Goals of care as noted above.  Communicated with Dr. Jeryl.  Recommend outpatient palliative services follow-up at her SNF.   Body mass index is 32.87 kg/m./Class I obesity Complicates care.  Outpatient follow-up.      Consultants:   Palliative care medicine Hematology   Procedures:       Discharge Instructions  Discharge Instructions     (HEART FAILURE PATIENTS) Call MD:  Anytime you have any of the following symptoms: 1) 3 pound weight gain in 24 hours or 5 pounds in 1 week 2) shortness of breath, with or without a dry hacking cough 3) swelling in the hands, feet or stomach 4) if you have to sleep on extra pillows at night in order to breathe.   Complete by: As directed    Call MD for:   Complete by: As directed     Recurrent rectal bleeding.   Call MD for:  difficulty breathing, headache or visual disturbances   Complete by: As directed    Call MD for:  extreme fatigue   Complete by: As directed    Call MD for:  persistant dizziness or light-headedness   Complete by: As directed    Call MD for:  persistant nausea and vomiting   Complete by: As directed    Call MD for:  severe uncontrolled pain   Complete by: As directed    Call MD for:  temperature >100.4   Complete by: As directed    Diet - low sodium heart healthy   Complete by: As directed    Increase activity slowly   Complete by: As directed    No wound care   Complete by: As directed  Medication List     STOP taking these medications    doxycycline  100 MG tablet Commonly known as: VIBRA -TABS   nirmatrelvir /ritonavir  20 x 150 MG & 10 x 100MG  Tabs Commonly known as: PAXLOVID    traMADol  HCl 25 MG Tabs       TAKE these medications    albuterol  108 (90 Base) MCG/ACT inhaler Commonly known as: VENTOLIN  HFA Inhale 2 puffs into the lungs every 4 (four) hours as needed for wheezing or shortness of breath.   alendronate 70 MG tablet Commonly known as: FOSAMAX Take 70 mg by mouth every Sunday. Take with a full glass of water on an empty stomach.   Align 4 MG Caps Take 4 mg by mouth at bedtime.   Anoro Ellipta  62.5-25 MCG/ACT Aepb Generic drug: umeclidinium-vilanterol Inhale 1 puff into the lungs every morning.   cetirizine  5 MG chewable tablet Commonly known as: ZYRTEC  Chew 5 mg by mouth daily as needed for allergies.   cyanocobalamin  1000 MCG tablet Commonly known as: VITAMIN B12 Take 1,000 mcg by mouth daily.   Dorzolamide  HCl-Timolol  Mal PF 2-0.5 % Soln Place 1 drop into both eyes in the morning and at bedtime.   ferrous gluconate  240 (27 FE) MG tablet Commonly known as: FERGON Take 240 mg by mouth every morning. Take without food   fluticasone  50 MCG/ACT nasal spray Commonly known as: FLONASE  Place  1 spray into both nostrils as needed for allergies or rhinitis.   guaifenesin  100 MG/5ML syrup Commonly known as: ROBITUSSIN Take 10 mLs by mouth every 4 (four) hours as needed for cough.   latanoprost  0.005 % ophthalmic solution Commonly known as: XALATAN  Place 1 drop into both eyes at bedtime.   loperamide  2 MG tablet Commonly known as: IMODIUM  A-D Take 4 mg by mouth as needed for diarrhea or loose stools.   MIRALAX  PO Take 8.6 g by mouth daily as needed.   oxybutynin  10 MG 24 hr tablet Commonly known as: DITROPAN -XL Take 10 mg by mouth at bedtime.   pantoprazole  40 MG tablet Commonly known as: PROTONIX  Take 1 tablet (40 mg total) by mouth 2 (two) times daily before a meal.   potassium chloride  SA 20 MEQ tablet Commonly known as: KLOR-CON  M Take 20 mEq by mouth daily.   PreserVision AREDS 2 Caps Take 1 capsule by mouth 2 (two) times daily.   PreviDent 5000 Booster Plus 1.1 % Pste Generic drug: Sodium Fluoride  Place 1 Application onto teeth See admin instructions. Brush on teeth with a toothbrush after evening mouth care. Spit out excess and do not rinse.   Rhopressa  0.02 % Soln Generic drug: Netarsudil  Dimesylate Place 1 drop into both eyes at bedtime.   rOPINIRole  0.5 MG tablet Commonly known as: REQUIP  Take 1 tablet (0.5 mg total) by mouth daily as needed.   senna 8.6 MG Tabs tablet Commonly known as: SENOKOT Take 2 tablets by mouth at bedtime.   sertraline  50 MG tablet Commonly known as: ZOLOFT  Take 1.5 tablets (75 mg total) by mouth daily.   simvastatin  20 MG tablet Commonly known as: ZOCOR  Take 20 mg by mouth every evening. HOLD WHILE ON PAXLOVID    torsemide  20 MG tablet Commonly known as: DEMADEX  Take 20 mg by mouth every morning.   Tylenol  325 MG Caps Generic drug: Acetaminophen  Take 650 mg by mouth every 6 (six) hours as needed (pain, headache or fever.). What changed:  medication strength how much to take when to take this reasons to take  this Another  medication with the same name was removed. Continue taking this medication, and follow the directions you see here.   vitamin C  1000 MG tablet Take 1,000 mg by mouth every morning.   Vitamin D3 50 MCG (2000 UT) Tabs Take 2,000 Units by mouth every morning.   zinc oxide 20 % ointment Apply 1 Application topically as needed for irritation.       Allergies  Allergen Reactions   Morphine And Codeine Itching   Aspirin Other (See Comments)    Unknown reaction   Duloxetine  Hcl Other (See Comments)    Unknown reaction   Tymlos [Abaloparatide] Other (See Comments)    Unknown reaction   Ventolin  [Albuterol ] Other (See Comments)    rapid heart beat   Cefadroxil Hives    Patient can take amoxicillin  and cipro       Procedures/Studies: IR US  Guide Vasc Access Right Result Date: 03/15/2024 CLINICAL DATA:  GI bleed. Active extravasation near the splenic flexure on recent CTA. Previous coil embolization x2 EXAM: IR ULTRASOUND GUIDANCE VASC ACCESS RIGHT; SELECTIVE VISCERAL ARTERIOGRAPHY; ADDITIONAL ARTERIOGRAPHY ANESTHESIA/SEDATION: Intravenous Fentanyl  50mcg and Versed  2mg  were administered by RN during a total moderate (conscious) sedation time of 35 minutes; the patient's level of consciousness and physiological / cardiorespiratory status were monitored continuously by radiology RN under my direct supervision. MEDICATIONS: Lidocaine  1% subcutaneous CONTRAST:  70mL OMNIPAQUE  IOHEXOL  300 MG/ML  SOLN PROCEDURE: The procedure, risks (including but not limited to bleeding, infection, organ damage ), benefits, and alternatives were explained to the patient. Questions regarding the procedure were encouraged and answered. The patient understands and consents to the procedure. Right femoral region prepped and draped in usual sterile fashion. Maximal barrier sterile technique was utilized including caps, mask, sterile gowns, sterile gloves, sterile drape, hand hygiene and skin antiseptic. The  right common femoral artery was localized under ultrasound. Under real-time ultrasound guidance, the vessel was accessed with a 21-gauge micropuncture needle, exchanged over a 018 guidewire for a transitional dilator, through which a 035 guidewire was advanced. Over this, a 5 [place] vascular sheath was placed, through which a 5 [place] RIM catheter was advanced and used to selectively catheterize the inferior mesenteric artery for selective arteriography in multiple projections. A coaxial Renegade microcatheter was advanced into two third-order branches of the IMA for selective arteriography. The microcatheter was removed. The RIM was exchanged for C2, utilized to selectively catheterize the superior mesenteric artery for selective arteriography in multiple projections. The catheter and sheath were removed and hemostasis achieved with the aid of the Celt device under ultrasound and fluoroscopic guidance. The patient tolerated the procedure well. COMPLICATIONS: None immediate FINDINGS: 2 sets of embolization coils from previous interventions noted in the IMA distribution at the level of the splenic flexure and proximal descending colon. No evidence of active extravasation, early draining vein, AVM, or other lesion to suggest a site or etiology of the patient's GI bleeding. IMPRESSION: 1. Negative mesenteric arteriogram. No evidence of active extravasation or other focal lesion to suggest etiology of GI bleed. Electronically Signed   By: JONETTA Faes M.D.   On: 03/15/2024 14:46   IR Angiogram Visceral Selective Result Date: 03/15/2024 CLINICAL DATA:  GI bleed. Active extravasation near the splenic flexure on recent CTA. Previous coil embolization x2 EXAM: IR ULTRASOUND GUIDANCE VASC ACCESS RIGHT; SELECTIVE VISCERAL ARTERIOGRAPHY; ADDITIONAL ARTERIOGRAPHY ANESTHESIA/SEDATION: Intravenous Fentanyl  50mcg and Versed  2mg  were administered by RN during a total moderate (conscious) sedation time of 35 minutes; the patient's  level of consciousness and physiological /  cardiorespiratory status were monitored continuously by radiology RN under my direct supervision. MEDICATIONS: Lidocaine  1% subcutaneous CONTRAST:  70mL OMNIPAQUE  IOHEXOL  300 MG/ML  SOLN PROCEDURE: The procedure, risks (including but not limited to bleeding, infection, organ damage ), benefits, and alternatives were explained to the patient. Questions regarding the procedure were encouraged and answered. The patient understands and consents to the procedure. Right femoral region prepped and draped in usual sterile fashion. Maximal barrier sterile technique was utilized including caps, mask, sterile gowns, sterile gloves, sterile drape, hand hygiene and skin antiseptic. The right common femoral artery was localized under ultrasound. Under real-time ultrasound guidance, the vessel was accessed with a 21-gauge micropuncture needle, exchanged over a 018 guidewire for a transitional dilator, through which a 035 guidewire was advanced. Over this, a 5 [place] vascular sheath was placed, through which a 5 [place] RIM catheter was advanced and used to selectively catheterize the inferior mesenteric artery for selective arteriography in multiple projections. A coaxial Renegade microcatheter was advanced into two third-order branches of the IMA for selective arteriography. The microcatheter was removed. The RIM was exchanged for C2, utilized to selectively catheterize the superior mesenteric artery for selective arteriography in multiple projections. The catheter and sheath were removed and hemostasis achieved with the aid of the Celt device under ultrasound and fluoroscopic guidance. The patient tolerated the procedure well. COMPLICATIONS: None immediate FINDINGS: 2 sets of embolization coils from previous interventions noted in the IMA distribution at the level of the splenic flexure and proximal descending colon. No evidence of active extravasation, early draining vein, AVM, or other  lesion to suggest a site or etiology of the patient's GI bleeding. IMPRESSION: 1. Negative mesenteric arteriogram. No evidence of active extravasation or other focal lesion to suggest etiology of GI bleed. Electronically Signed   By: JONETTA Faes M.D.   On: 03/15/2024 14:46   IR Angiogram Selective Each Additional Vessel Result Date: 03/15/2024 CLINICAL DATA:  GI bleed. Active extravasation near the splenic flexure on recent CTA. Previous coil embolization x2 EXAM: IR ULTRASOUND GUIDANCE VASC ACCESS RIGHT; SELECTIVE VISCERAL ARTERIOGRAPHY; ADDITIONAL ARTERIOGRAPHY ANESTHESIA/SEDATION: Intravenous Fentanyl  50mcg and Versed  2mg  were administered by RN during a total moderate (conscious) sedation time of 35 minutes; the patient's level of consciousness and physiological / cardiorespiratory status were monitored continuously by radiology RN under my direct supervision. MEDICATIONS: Lidocaine  1% subcutaneous CONTRAST:  70mL OMNIPAQUE  IOHEXOL  300 MG/ML  SOLN PROCEDURE: The procedure, risks (including but not limited to bleeding, infection, organ damage ), benefits, and alternatives were explained to the patient. Questions regarding the procedure were encouraged and answered. The patient understands and consents to the procedure. Right femoral region prepped and draped in usual sterile fashion. Maximal barrier sterile technique was utilized including caps, mask, sterile gowns, sterile gloves, sterile drape, hand hygiene and skin antiseptic. The right common femoral artery was localized under ultrasound. Under real-time ultrasound guidance, the vessel was accessed with a 21-gauge micropuncture needle, exchanged over a 018 guidewire for a transitional dilator, through which a 035 guidewire was advanced. Over this, a 5 [place] vascular sheath was placed, through which a 5 [place] RIM catheter was advanced and used to selectively catheterize the inferior mesenteric artery for selective arteriography in multiple projections. A  coaxial Renegade microcatheter was advanced into two third-order branches of the IMA for selective arteriography. The microcatheter was removed. The RIM was exchanged for C2, utilized to selectively catheterize the superior mesenteric artery for selective arteriography in multiple projections. The catheter and sheath were removed and hemostasis  achieved with the aid of the Celt device under ultrasound and fluoroscopic guidance. The patient tolerated the procedure well. COMPLICATIONS: None immediate FINDINGS: 2 sets of embolization coils from previous interventions noted in the IMA distribution at the level of the splenic flexure and proximal descending colon. No evidence of active extravasation, early draining vein, AVM, or other lesion to suggest a site or etiology of the patient's GI bleeding. IMPRESSION: 1. Negative mesenteric arteriogram. No evidence of active extravasation or other focal lesion to suggest etiology of GI bleed. Electronically Signed   By: JONETTA Faes M.D.   On: 03/15/2024 14:46   IR Angiogram Selective Each Additional Vessel Result Date: 03/15/2024 CLINICAL DATA:  GI bleed. Active extravasation near the splenic flexure on recent CTA. Previous coil embolization x2 EXAM: IR ULTRASOUND GUIDANCE VASC ACCESS RIGHT; SELECTIVE VISCERAL ARTERIOGRAPHY; ADDITIONAL ARTERIOGRAPHY ANESTHESIA/SEDATION: Intravenous Fentanyl  50mcg and Versed  2mg  were administered by RN during a total moderate (conscious) sedation time of 35 minutes; the patient's level of consciousness and physiological / cardiorespiratory status were monitored continuously by radiology RN under my direct supervision. MEDICATIONS: Lidocaine  1% subcutaneous CONTRAST:  70mL OMNIPAQUE  IOHEXOL  300 MG/ML  SOLN PROCEDURE: The procedure, risks (including but not limited to bleeding, infection, organ damage ), benefits, and alternatives were explained to the patient. Questions regarding the procedure were encouraged and answered. The patient  understands and consents to the procedure. Right femoral region prepped and draped in usual sterile fashion. Maximal barrier sterile technique was utilized including caps, mask, sterile gowns, sterile gloves, sterile drape, hand hygiene and skin antiseptic. The right common femoral artery was localized under ultrasound. Under real-time ultrasound guidance, the vessel was accessed with a 21-gauge micropuncture needle, exchanged over a 018 guidewire for a transitional dilator, through which a 035 guidewire was advanced. Over this, a 5 [place] vascular sheath was placed, through which a 5 [place] RIM catheter was advanced and used to selectively catheterize the inferior mesenteric artery for selective arteriography in multiple projections. A coaxial Renegade microcatheter was advanced into two third-order branches of the IMA for selective arteriography. The microcatheter was removed. The RIM was exchanged for C2, utilized to selectively catheterize the superior mesenteric artery for selective arteriography in multiple projections. The catheter and sheath were removed and hemostasis achieved with the aid of the Celt device under ultrasound and fluoroscopic guidance. The patient tolerated the procedure well. COMPLICATIONS: None immediate FINDINGS: 2 sets of embolization coils from previous interventions noted in the IMA distribution at the level of the splenic flexure and proximal descending colon. No evidence of active extravasation, early draining vein, AVM, or other lesion to suggest a site or etiology of the patient's GI bleeding. IMPRESSION: 1. Negative mesenteric arteriogram. No evidence of active extravasation or other focal lesion to suggest etiology of GI bleed. Electronically Signed   By: JONETTA Faes M.D.   On: 03/15/2024 14:46   IR Angiogram Selective Each Additional Vessel Result Date: 03/15/2024 CLINICAL DATA:  GI bleed. Active extravasation near the splenic flexure on recent CTA. Previous coil embolization  x2 EXAM: IR ULTRASOUND GUIDANCE VASC ACCESS RIGHT; SELECTIVE VISCERAL ARTERIOGRAPHY; ADDITIONAL ARTERIOGRAPHY ANESTHESIA/SEDATION: Intravenous Fentanyl  50mcg and Versed  2mg  were administered by RN during a total moderate (conscious) sedation time of 35 minutes; the patient's level of consciousness and physiological / cardiorespiratory status were monitored continuously by radiology RN under my direct supervision. MEDICATIONS: Lidocaine  1% subcutaneous CONTRAST:  70mL OMNIPAQUE  IOHEXOL  300 MG/ML  SOLN PROCEDURE: The procedure, risks (including but not limited to bleeding, infection, organ damage ),  benefits, and alternatives were explained to the patient. Questions regarding the procedure were encouraged and answered. The patient understands and consents to the procedure. Right femoral region prepped and draped in usual sterile fashion. Maximal barrier sterile technique was utilized including caps, mask, sterile gowns, sterile gloves, sterile drape, hand hygiene and skin antiseptic. The right common femoral artery was localized under ultrasound. Under real-time ultrasound guidance, the vessel was accessed with a 21-gauge micropuncture needle, exchanged over a 018 guidewire for a transitional dilator, through which a 035 guidewire was advanced. Over this, a 5 [place] vascular sheath was placed, through which a 5 [place] RIM catheter was advanced and used to selectively catheterize the inferior mesenteric artery for selective arteriography in multiple projections. A coaxial Renegade microcatheter was advanced into two third-order branches of the IMA for selective arteriography. The microcatheter was removed. The RIM was exchanged for C2, utilized to selectively catheterize the superior mesenteric artery for selective arteriography in multiple projections. The catheter and sheath were removed and hemostasis achieved with the aid of the Celt device under ultrasound and fluoroscopic guidance. The patient tolerated the  procedure well. COMPLICATIONS: None immediate FINDINGS: 2 sets of embolization coils from previous interventions noted in the IMA distribution at the level of the splenic flexure and proximal descending colon. No evidence of active extravasation, early draining vein, AVM, or other lesion to suggest a site or etiology of the patient's GI bleeding. IMPRESSION: 1. Negative mesenteric arteriogram. No evidence of active extravasation or other focal lesion to suggest etiology of GI bleed. Electronically Signed   By: JONETTA Faes M.D.   On: 03/15/2024 14:46   IR Angiogram Visceral Selective Result Date: 03/15/2024 CLINICAL DATA:  GI bleed. Active extravasation near the splenic flexure on recent CTA. Previous coil embolization x2 EXAM: IR ULTRASOUND GUIDANCE VASC ACCESS RIGHT; SELECTIVE VISCERAL ARTERIOGRAPHY; ADDITIONAL ARTERIOGRAPHY ANESTHESIA/SEDATION: Intravenous Fentanyl  50mcg and Versed  2mg  were administered by RN during a total moderate (conscious) sedation time of 35 minutes; the patient's level of consciousness and physiological / cardiorespiratory status were monitored continuously by radiology RN under my direct supervision. MEDICATIONS: Lidocaine  1% subcutaneous CONTRAST:  70mL OMNIPAQUE  IOHEXOL  300 MG/ML  SOLN PROCEDURE: The procedure, risks (including but not limited to bleeding, infection, organ damage ), benefits, and alternatives were explained to the patient. Questions regarding the procedure were encouraged and answered. The patient understands and consents to the procedure. Right femoral region prepped and draped in usual sterile fashion. Maximal barrier sterile technique was utilized including caps, mask, sterile gowns, sterile gloves, sterile drape, hand hygiene and skin antiseptic. The right common femoral artery was localized under ultrasound. Under real-time ultrasound guidance, the vessel was accessed with a 21-gauge micropuncture needle, exchanged over a 018 guidewire for a transitional dilator,  through which a 035 guidewire was advanced. Over this, a 5 [place] vascular sheath was placed, through which a 5 [place] RIM catheter was advanced and used to selectively catheterize the inferior mesenteric artery for selective arteriography in multiple projections. A coaxial Renegade microcatheter was advanced into two third-order branches of the IMA for selective arteriography. The microcatheter was removed. The RIM was exchanged for C2, utilized to selectively catheterize the superior mesenteric artery for selective arteriography in multiple projections. The catheter and sheath were removed and hemostasis achieved with the aid of the Celt device under ultrasound and fluoroscopic guidance. The patient tolerated the procedure well. COMPLICATIONS: None immediate FINDINGS: 2 sets of embolization coils from previous interventions noted in the IMA distribution at the level of the  splenic flexure and proximal descending colon. No evidence of active extravasation, early draining vein, AVM, or other lesion to suggest a site or etiology of the patient's GI bleeding. IMPRESSION: 1. Negative mesenteric arteriogram. No evidence of active extravasation or other focal lesion to suggest etiology of GI bleed. Electronically Signed   By: JONETTA Faes M.D.   On: 03/15/2024 14:46   CT Angio Abd/Pel W and/or Wo Contrast Result Date: 03/13/2024 CLINICAL DATA:  Lower gastrointestinal bleeding EXAM: CTA ABDOMEN AND PELVIS WITHOUT AND WITH CONTRAST TECHNIQUE: Multidetector CT imaging of the abdomen and pelvis was performed using the standard protocol during bolus administration of intravenous contrast. Multiplanar reconstructed images and MIPs were obtained and reviewed to evaluate the vascular anatomy. RADIATION DOSE REDUCTION: This exam was performed according to the departmental dose-optimization program which includes automated exposure control, adjustment of the mA and/or kV according to patient size and/or use of iterative  reconstruction technique. CONTRAST:  OMNIPAQUE  IOHEXOL  350 MG/ML SOLN COMPARISON:  08/17/2018, 12/27/2023 FINDINGS: VASCULAR Aorta: Normal caliber aorta without aneurysm, dissection, vasculitis or significant stenosis. Stable atherosclerosis. Celiac: Patent without evidence of aneurysm, dissection, vasculitis or significant stenosis. SMA: Patent without evidence of aneurysm, dissection, vasculitis or significant stenosis. Renals: Both renal arteries are patent without evidence of aneurysm, dissection, vasculitis, or fibromuscular dysplasia. Stable mild stenosis of both right renal artery, with wide patency of the left renal artery. IMA: Patent without evidence of aneurysm, dissection, vasculitis or significant stenosis. Embolic coils are seen within branches of the IMA near the splenic flexure of the colon and proximal descending colon. Inflow: Patent without evidence of aneurysm, dissection, vasculitis or significant stenosis. Proximal Outflow: Bilateral common femoral and visualized portions of the superficial and profunda femoral arteries are patent without evidence of aneurysm, dissection, vasculitis or significant stenosis. Veins: No obvious venous abnormality within the limitations of this arterial phase study. Review of the MIP images confirms the above findings. NON-VASCULAR Lower chest: No acute pleural or parenchymal lung disease. Hepatobiliary: Cholelithiasis without cholecystitis. Mild hepatic steatosis. No focal liver abnormality. Pancreas: Unremarkable. No pancreatic ductal dilatation or surrounding inflammatory changes. Spleen: Normal in size without focal abnormality. Adrenals/Urinary Tract: Stable left extrarenal pelvis. The kidneys enhance normally. The adrenals and bladder are unremarkable. Stomach/Bowel: There is intraluminal accumulation of contrast within the distal transverse colon and splenic flexure of the colon, consistent with acute gastrointestinal hemorrhage. Hemorrhage is likely  due to extensive diverticulosis in this region. This is slightly proximal to the location of the hemorrhage seen on prior CT exam. No bowel obstruction or ileus. Diffuse colonic diverticulosis with no evidence of acute diverticulitis. Normal appendix right lower quadrant. Lymphatic: No pathologic adenopathy. Reproductive: Uterus and bilateral adnexa are unremarkable. Other: No free fluid or free intraperitoneal gas. No abdominal wall hernia. Musculoskeletal: No acute or destructive bony abnormalities. Chronic L1 compression deformity again noted. Reconstructed images demonstrate no additional findings. IMPRESSION: VASCULAR 1. Active gastrointestinal hemorrhage within the distal transverse colon and splenic flexure of the colon, likely due to diverticulosis. 2.  Aortic Atherosclerosis (ICD10-I70.0). 3. 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 NON-VASCULAR 1. Cholelithiasis without cholecystitis. 2. Diverticulosis without diverticulitis. Critical Value/emergent results were called by telephone at the time of interpretation on 03/13/2024 at 10:01 pm to provider [facility] , who verbally acknowledged these results. Electronically Signed   By: Ozell Daring M.D.   On: 03/13/2024 22:03      Subjective: Patient denies complaints.  No chest pain,  dyspnea or cough reported.  No bleeding reported by patient or RN.  No acute issues per RN.  When I mentioned this morning prior to hematology seeing her that we were waiting on their consultation, she stated that given her age, it would be best to leave her as is without aggressive evaluation.  Advised her that her MD son had requested hematology consultation.  Discharge Exam:  Vitals:   03/17/24 0531 03/17/24 0706 03/17/24 1302 03/17/24 2051  BP: (!) 109/53  (!) 104/52 112/60  Pulse: 78  72 76  Resp: 18  16 18   Temp: 98.3 F (36.8 C)  98.5 F (36.9 C) 98.1 F (36.7 C)  TempSrc:      SpO2:  99%  98% 99%  Weight:  78.9 kg    Height:        General exam: Elderly female, moderately built and frail lying comfortably propped up in bed without distress.  Has [place] oxygen  on. Respiratory system: Clear to auscultation.  No increased work of breathing. Cardiovascular system: S1 & S2 heard, RRR. No JVD, murmurs, rubs, gallops or clicks. No pedal edema. Gastrointestinal system: Abdomen is nondistended, soft and nontender. No organomegaly or masses felt. Normal bowel sounds heard. Central nervous system: Alert and oriented x 2. No focal neurological deficits. Extremities: Symmetric 5 x 5 power. Skin: No rashes, lesions or ulcers Psychiatry: Judgement and insight appear somewhat impaired. Mood & affect appropriate.     The results of significant diagnostics from this hospitalization (including imaging, microbiology, ancillary and laboratory) are listed below for reference.     Microbiology: Recent Results (from the past 240 hours)  MRSA Next Gen by PCR, Nasal     Status: None   Collection Time: 03/14/24  1:28 AM   Specimen: Anterior Nasal Swab  Result Value Ref Range Status   MRSA by PCR Next Gen NOT DETECTED NOT DETECTED Final    Comment: (NOTE) The GeneXpert MRSA Assay (FDA approved for NASAL specimens only), is one component of a comprehensive MRSA colonization surveillance program. It is not intended to diagnose MRSA infection nor to guide or monitor treatment for MRSA infections. Test performance is not FDA approved in patients less than 59 years old. Performed at [facility], [address] [place], [place] [address]   Resp panel by RT-PCR (RSV, Flu A&B, Covid) Anterior Nasal Swab     Status: Abnormal   Collection Time: 03/14/24 11:01 AM   Specimen: Anterior Nasal Swab  Result Value Ref Range Status   SARS Coronavirus 2 by RT PCR POSITIVE (A) NEGATIVE Final    Comment: (NOTE) SARS-CoV-2 target nucleic acids are DETECTED.  The SARS-CoV-2 RNA is  generally detectable in upper respiratory specimens during the acute phase of infection. Positive results are indicative of the presence of the identified virus, but do not rule out bacterial infection or co-infection with other pathogens not detected by the test. Clinical correlation with patient history and other diagnostic information is necessary to determine patient infection status. The expected result is Negative.  Fact Sheet for Patients: BloggerCourse.com  Fact Sheet for Healthcare Providers: SeriousBroker.it  This test is not yet approved or cleared by the [place]  FDA and  has been authorized for detection and/or diagnosis of SARS-CoV-2 by FDA under an Emergency Use Authorization (EUA).  This EUA will remain in effect (meaning this test can be used) for the duration of  the COVID-19 declaration under Section 564(b)(1) of the A ct, 21 U.S.C. section 360bbb-3(b)(1), unless  the authorization is terminated or revoked sooner.     Influenza A by PCR NEGATIVE NEGATIVE Final   Influenza B by PCR NEGATIVE NEGATIVE Final    Comment: (NOTE) The Xpert Xpress SARS-CoV-2/FLU/RSV plus assay is intended as an aid in the diagnosis of influenza from Nasopharyngeal swab specimens and should not be used as a sole basis for treatment. Nasal washings and aspirates are unacceptable for Xpert Xpress SARS-CoV-2/FLU/RSV testing.  Fact Sheet for Patients: BloggerCourse.com  Fact Sheet for Healthcare Providers: SeriousBroker.it  This test is not yet approved or cleared by the [place]  FDA and has been authorized for detection and/or diagnosis of SARS-CoV-2 by FDA under an Emergency Use Authorization (EUA). This EUA will remain in effect (meaning this test can be used) for the duration of the COVID-19 declaration under Section 564(b)(1) of the Act, 21 U.S.C. section 360bbb-3(b)(1),  unless the authorization is terminated or revoked.     Resp Syncytial Virus by PCR NEGATIVE NEGATIVE Final    Comment: (NOTE) Fact Sheet for Patients: BloggerCourse.com  Fact Sheet for Healthcare Providers: SeriousBroker.it  This test is not yet approved or cleared by the [place]  FDA and has been authorized for detection and/or diagnosis of SARS-CoV-2 by FDA under an Emergency Use Authorization (EUA). This EUA will remain in effect (meaning this test can be used) for the duration of the COVID-19 declaration under Section 564(b)(1) of the Act, 21 U.S.C. section 360bbb-3(b)(1), unless the authorization is terminated or revoked.  Performed at [facility], [address] [place], [place] [address]      Labs: CBC: Recent Labs  Lab 03/13/24 2052 03/14/24 0313 03/14/24 1826 03/14/24 2334 03/15/24 0301 03/15/24 0830 03/15/24 1705 03/17/24 1331 03/17/24 1707 03/18/24 0452  WBC 5.4   < > 3.4*  --  3.3*  --   --  3.7* 3.6* 3.7*  NEUTROABS 3.8  --   --   --   --   --   --   --  2.2 2.0  HGB 10.3*   < > 7.4*   < > 7.2* 7.2* 7.3* 7.6* 7.6* 7.6*  HCT 33.3*   < > 24.3*   < > 23.3* 24.2* 24.8* 25.1* 24.8* 24.2*  MCV 98.5   < > 100.0  --  101.7*  --   --  100.8* 99.6 99.6  PLT 143*   < > 106*  --  103*  --   --  128* 136* 146*   < > = values in this interval not displayed.    Basic Metabolic Panel: Recent Labs  Lab 03/13/24 2052 03/14/24 0313 03/15/24 0301 03/18/24 0452  NA 137 138 142 143  K 4.2 3.9 3.6 3.6  CL 104 107 110 108  CO2 21* 22 24 26   GLUCOSE 155* 138* 101* 105*  BUN 13 13 12  7*  CREATININE 0.61 0.44 0.31* 0.31*  CALCIUM  8.8* 7.7* 7.8* 8.3*  MG  --  2.4  --   --   PHOS  --  3.0  --   --     Liver Function Tests: Recent Labs  Lab 03/13/24 2052 03/18/24 0452  AST 23 19  ALT 8 6  ALKPHOS 51 41  BILITOT 0.3 0.3  PROT 6.4* 5.2*  ALBUMIN 3.8 3.2*    Anemia work up Recent  Labs    03/17/24 1707  VITAMINB12 1,167*  FOLATE 9.3  FERRITIN 66  TIBC 256  IRON  28  RETICCTPCT 1.8   Discussed in detail with  patient's MD son, updated care and answered all questions.  He was appreciative of the call.  Time coordinating discharge: 45 minutes  SIGNED:  Trenda Mar, MD,  FACP, [facility], [facility], [facility]   [facility] [place]     To contact the attending provider between 7A-7P or the covering provider during after hours 7P-7A, please log into the web site [email or web address] and access using universal [place] password for that web site. If you do not have the password, please call the hospital operator.

## 2024-03-18 NOTE — TOC Progression Note (Signed)
 Transition of Care [facility]) - Progression Note    Patient Details  Name: [name] MRN: [id number] Date of Birth: 1941-04-25  Transition of Care [facility]) CM/SW Contact  Quinesha Selinger, Glenys DASEN, RN Phone Number:  Clinical Narrative:    CM informed provider that if patient is medically ready for discharge, she can return to [facility]. Insurance auth approved for 7 days 9/18 to 9/24. [facility] updated.   Expected Discharge Plan: Assisted Living Barriers to Discharge: Continued Medical Work up               Expected Discharge Plan and Services In-house Referral: NA Discharge Planning Services: CM Consult Post Acute Care Choice: NA Living arrangements for the past 2 months: Assisted Living Facility                 DME Arranged: N/A DME Agency: NA       HH Arranged: NA HH Agency: NA         Social Drivers of Health (SDOH) Interventions SDOH Screenings   Food Insecurity: No Food Insecurity (03/14/2024)  Housing: Low Risk  (03/14/2024)  Transportation Needs: No Transportation Needs (03/14/2024)  Utilities: Not At Risk (03/14/2024)  Alcohol  Screen: Low Risk  (04/04/2023)  Depression (PHQ2-9): Low Risk  (03/04/2024)  Financial Resource Strain: Low Risk  (04/04/2023)  Physical Activity: Insufficiently Active (04/04/2023)  Social Connections: Socially Integrated (03/14/2024)  Stress: No Stress Concern Present (04/04/2023)  Tobacco Use: Medium Risk (03/14/2024)  Health Literacy: Adequate Health Literacy (04/04/2023)    Readmission Risk Interventions    03/15/2024   11:12 AM 01/01/2024   11:17 AM 11/19/2023   11:31 AM  Readmission Risk Prevention Plan  Transportation Screening Complete Complete Complete  PCP or Specialist Appt within 5-7 Days   Complete  PCP or Specialist Appt within 3-5 Days Complete Complete   Home Care Screening   Complete  Medication Review (RN CM)   Complete  HRI or Home Care Consult Complete Complete   Social Work Consult for Recovery Care  Planning/Counseling Complete Complete   Palliative Care Screening Not Applicable Not Applicable   Medication Review Oceanographer) Complete Complete

## 2024-03-18 NOTE — Plan of Care (Signed)
  Problem: Education: Goal: Knowledge of General Education information will improve Description: Including pain rating scale, medication(s)/side effects and non-pharmacologic comfort measures 03/18/2024 1534 by Morna Aquas, RN Outcome: Adequate for Discharge 03/18/2024 1532 by Morna Aquas, RN Outcome: Progressing   Problem: Health Behavior/Discharge Planning: Goal: Ability to manage health-related needs will improve Outcome: Adequate for Discharge   Problem: Clinical Measurements: Goal: Ability to maintain clinical measurements within normal limits will improve Outcome: Adequate for Discharge Goal: Will remain free from infection Outcome: Adequate for Discharge Goal: Diagnostic test results will improve Outcome: Adequate for Discharge Goal: Respiratory complications will improve Outcome: Adequate for Discharge Goal: Cardiovascular complication will be avoided Outcome: Adequate for Discharge   Problem: Activity: Goal: Risk for activity intolerance will decrease Outcome: Adequate for Discharge   Problem: Nutrition: Goal: Adequate nutrition will be maintained 03/18/2024 1534 by Morna Aquas, RN Outcome: Adequate for Discharge 03/18/2024 1532 by Morna Aquas, RN Outcome: Progressing   Problem: Coping: Goal: Level of anxiety will decrease 03/18/2024 1534 by Morna Aquas, RN Outcome: Adequate for Discharge 03/18/2024 1532 by Morna Aquas, RN Outcome: Progressing   Problem: Elimination: Goal: Will not experience complications related to bowel motility Outcome: Adequate for Discharge Goal: Will not experience complications related to urinary retention Outcome: Adequate for Discharge   Problem: Pain Managment: Goal: General experience of comfort will improve and/or be controlled Outcome: Adequate for Discharge   Problem: Safety: Goal: Ability to remain free from injury will improve 03/18/2024 1534 by Morna Aquas, RN Outcome: Adequate for  Discharge 03/18/2024 1532 by Morna Aquas, RN Outcome: Progressing   Problem: Skin Integrity: Goal: Risk for impaired skin integrity will decrease Outcome: Adequate for Discharge   Problem: Education: Goal: Understanding of CV disease, CV risk reduction, and recovery process will improve Outcome: Adequate for Discharge Goal: Individualized Educational Video(s) Outcome: Adequate for Discharge   Problem: Activity: Goal: Ability to return to baseline activity level will improve Outcome: Adequate for Discharge   Problem: Cardiovascular: Goal: Ability to achieve and maintain adequate cardiovascular perfusion will improve Outcome: Adequate for Discharge Goal: Vascular access site(s) Level 0-1 will be maintained Outcome: Adequate for Discharge   Problem: Health Behavior/Discharge Planning: Goal: Ability to safely manage health-related needs after discharge will improve Outcome: Adequate for Discharge

## 2024-03-18 NOTE — Plan of Care (Signed)

## 2024-03-18 NOTE — [place] FL2 (Signed)
 [place]  MEDICAID FL2 LEVEL OF CARE FORM     IDENTIFICATION  Patient Name: [name] Birthdate: 1941/06/11 Sex: female Admission Date (Current Location): 03/13/2024  [facility] and [place] Number:  Producer, television/film/video and Address:  [facility],  [address] [place], [place] [address]      Provider Number: [id number]  Attending Physician Name and Address:  Judeth Trenda BIRCH, MD  Relative Name and Phone Number:       Current Level of Care: Hospital Recommended Level of Care: Skilled Nursing Facility Prior Approval Number:    Date Approved/Denied:   PASRR Number: [id number]  Discharge Plan: SNF    Current Diagnoses: Patient Active Problem List   Diagnosis Date Noted   Thrombocytopenia (HCC) 03/18/2024   Lower gastrointestinal bleeding 03/13/2024   Hypokalemia 12/28/2023   Acute on chronic blood loss anemia 12/27/2023   Acute lower GI bleeding 12/27/2023   Closed compression fracture of body of L1 vertebra (HCC) 12/09/2023   Urinary incontinence 12/05/2023   Acute GI bleeding 11/18/2023   Ischemic colitis (HCC) 10/27/2023   Acute upper GI bleed 10/25/2023   Edema of right lower extremity 10/25/2023   Chronic heart failure with preserved ejection fraction (HFpEF) (HCC) 10/25/2023   UGI bleed 04/19/2023   Recurrent depression (HCC) 04/13/2023   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   History of GI diverticular bleed 03/28/2023   GAD (generalized anxiety disorder) 03/28/2023   Diastolic heart failure (HCC) 03/19/2023   Class 1 obesity 03/19/2023   Thoracic aorta atherosclerosis (HCC) 03/19/2023   Melena 01/05/2023   Hypotension 05/02/2022   UTI (urinary tract infection) 02/05/2022   Restless leg syndrome 02/05/2022   Senile osteoporosis 02/05/2022   Hypertension 02/05/2022   Pulmonary nodule 1 cm or greater in diameter 02/05/2022   GERD (gastroesophageal reflux disease) 02/05/2022   Upper GI bleed 01/07/2022   Iron  deficiency anemia  05/02/2021   Acute on chronic anemia    DOE (dyspnea on exertion) 11/18/2017   COPD (chronic obstructive pulmonary disease) (HCC) 11/18/2017   Diverticulosis of intestine with bleeding 09/05/2017   Acute respiratory failure with hypoxia (HCC) 08/07/2017   Depression with anxiety 08/07/2017   Hyperlipidemia 08/07/2017   Macular degeneration 08/07/2017   Glaucoma 08/07/2017   Weakness 08/07/2017   Dehydration 08/07/2017   Lower GI bleeding 10/14/2016   Diverticulosis of colon 10/14/2016   Anxiety 10/14/2016   Lower GI bleed 10/14/2016   GI bleed    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   Dermatochalasis of both upper eyelids 08/17/2014   Osteoarthritis, multiple sites 12/20/2011    Orientation RESPIRATION BLADDER Height & Weight     Self, Time, Situation, Place  Normal External catheter Weight: 78.9 kg Height:  5' 1 (154.9 cm)  BEHAVIORAL SYMPTOMS/MOOD NEUROLOGICAL BOWEL NUTRITION STATUS      Continent Diet  AMBULATORY STATUS COMMUNICATION OF NEEDS Skin   Extensive Assist Verbally Normal                       Personal Care Assistance Level of Assistance  Bathing, Feeding, Dressing Bathing Assistance: Limited assistance   Dressing Assistance: Limited assistance     Functional Limitations Info  Sight, Hearing Sight Info: Impaired Hearing Info: Impaired (Hearing aids)      SPECIAL CARE FACTORS FREQUENCY  PT (By licensed PT), OT (By licensed OT)     PT Frequency: 5x weekly OT Frequency: 5x weekly  Contractures Contractures Info: Not present    Additional Factors Info  Code Status, Allergies, Psychotropic Code Status Info: DNR - Comfort Allergies Info: Morphine And Codeine, Aspirin, Duloxetine  Hcl, Tymlos (Abaloparatide), Ventolin  (Albuterol ), Cefadroxil Psychotropic Info: Sertraline          Current Medications (03/18/2024):  This is the current hospital active medication list Current Facility-Administered Medications   Medication Dose Route Frequency Provider Last Rate Last Admin   acetaminophen  (TYLENOL ) tablet 650 mg  650 mg Oral Q4H PRN Krishnan, Gokul, MD       Chlorhexidine  Gluconate Cloth 2 % PADS 6 each  6 each Topical Daily Albustami, Omar M, MD   6 each at 03/18/24 0911   cyanocobalamin  (VITAMIN B12) tablet 1,000 mcg  1,000 mcg Oral Daily Krishnan, Gokul, MD   1,000 mcg at 03/18/24 9095   docusate sodium  (COLACE) capsule 100 mg  100 mg Oral BID PRN Albustami, Omar M, MD       dorzolamide -timolol  (COSOPT ) 2-0.5 % ophthalmic solution 1 drop  1 drop Both Eyes Daily Alghanim, Fahid, MD   1 drop at 03/18/24 0907   ferrous gluconate  (FERGON) tablet 324 mg  324 mg Oral Q breakfast Alghanim, Fahid, MD   324 mg at 03/18/24 0900   ipratropium-albuterol  (DUONEB) 0.5-2.5 (3) MG/3ML nebulizer solution 3 mL  3 mL Nebulization Q6H PRN Simpson, Paula B, NP       latanoprost  (XALATAN ) 0.005 % ophthalmic solution 1 drop  1 drop Both Eyes QHS Antonetta Moccasin B, NP   1 drop at 03/17/24 2143   multivitamin (PROSIGHT) tablet 1 tablet  1 tablet Oral Daily Alghanim, Fahid, MD   1 tablet at 03/18/24 0902   Netarsudil  Dimesylate 0.02 % SOLN 1 drop  1 drop Both Eyes QHS Simpson, Paula B, NP       oxybutynin  (DITROPAN -XL) 24 hr tablet 10 mg  10 mg Oral QHS Simpson, Paula B, NP   10 mg at 03/17/24 2143   pantoprazole  (PROTONIX ) EC tablet 40 mg  40 mg Oral BID AC Krishnan, Gokul, MD   40 mg at 03/18/24 0900   polyethylene glycol (MIRALAX  / GLYCOLAX ) packet 17 g  17 g Oral Daily PRN Albustami, Omar M, MD       rOPINIRole  (REQUIP ) tablet 0.5 mg  0.5 mg Oral Daily PRN Simpson, Paula B, NP       sertraline  (ZOLOFT ) tablet 75 mg  75 mg Oral Daily Simpson, Paula B, NP   75 mg at 03/18/24 0902   umeclidinium-vilanterol (ANORO ELLIPTA ) 62.5-25 MCG/ACT 1 puff  1 puff Inhalation Daily Antonetta Moccasin B, NP   1 puff at 03/18/24 0859     Discharge Medications: Please see discharge summary for a list of discharge medications.  alendronate 70  MG tablet Commonly known as: FOSAMAX Take 70 mg by mouth every Sunday. Take with a full glass of water on an empty stomach.    Align 4 MG Caps Take 4 mg by mouth at bedtime.    Anoro Ellipta  62.5-25 MCG/ACT Aepb Generic drug: umeclidinium-vilanterol Inhale 1 puff into the lungs every morning.    cetirizine  5 MG chewable tablet Commonly known as: ZYRTEC  Chew 5 mg by mouth daily as needed for allergies.    cyanocobalamin  1000 MCG tablet Commonly known as: VITAMIN B12 Take 1,000 mcg by mouth daily.    Dorzolamide  HCl-Timolol  Mal PF 2-0.5 % Soln Place 1 drop into both eyes in the morning and at bedtime.    ferrous gluconate  240 (27 FE) MG  tablet Commonly known as: FERGON Take 240 mg by mouth every morning. Take without food    fluticasone  50 MCG/ACT nasal spray Commonly known as: FLONASE  Place 1 spray into both nostrils as needed for allergies or rhinitis.    guaifenesin  100 MG/5ML syrup Commonly known as: ROBITUSSIN Take 10 mLs by mouth every 4 (four) hours as needed for cough.    latanoprost  0.005 % ophthalmic solution Commonly known as: XALATAN  Place 1 drop into both eyes at bedtime.    loperamide  2 MG tablet Commonly known as: IMODIUM  A-D Take 4 mg by mouth as needed for diarrhea or loose stools.    MIRALAX  PO Take 8.6 g by mouth daily as needed.    oxybutynin  10 MG 24 hr tablet Commonly known as: DITROPAN -XL Take 10 mg by mouth at bedtime.    pantoprazole  40 MG tablet Commonly known as: PROTONIX  Take 1 tablet (40 mg total) by mouth 2 (two) times daily before a meal.    potassium chloride  SA 20 MEQ tablet Commonly known as: KLOR-CON  M Take 20 mEq by mouth daily.    PreserVision AREDS 2 Caps Take 1 capsule by mouth 2 (two) times daily.    PreviDent 5000 Booster Plus 1.1 % Pste Generic drug: Sodium Fluoride  Place 1 Application onto teeth See admin instructions. Brush on teeth with a toothbrush after evening mouth care. Spit out excess and do not rinse.     Rhopressa  0.02 % Soln Generic drug: Netarsudil  Dimesylate Place 1 drop into both eyes at bedtime.    rOPINIRole  0.5 MG tablet Commonly known as: REQUIP  Take 1 tablet (0.5 mg total) by mouth daily as needed.    senna 8.6 MG Tabs tablet Commonly known as: SENOKOT Take 2 tablets by mouth at bedtime.    sertraline  50 MG tablet Commonly known as: ZOLOFT  Take 1.5 tablets (75 mg total) by mouth daily.    simvastatin  20 MG tablet Commonly known as: ZOCOR  Take 20 mg by mouth every evening. HOLD WHILE ON PAXLOVID     torsemide  20 MG tablet Commonly known as: DEMADEX  Take 20 mg by mouth every morning.    Tylenol  325 MG Caps Generic drug: Acetaminophen  Take 650 mg by mouth every 6 (six) hours as needed (pain, headache or fever.). What changed:  medication strength how much to take when to take this reasons to take this Another medication with the same name was removed. Continue taking this medication, and follow the directions you see here.    vitamin C  1000 MG tablet Take 1,000 mg by mouth every morning.    Vitamin D3 50 MCG (2000 UT) Tabs Take 2,000 Units by mouth every morning.    zinc oxide 20 % ointment Apply 1 Application topically as needed for irritation.     Relevant Imaging Results:  Relevant Lab Results:   Additional Information SSN [id number]  Doneta Glenys DASEN, RN

## 2024-03-18 NOTE — Discharge Instructions (Signed)

## 2024-03-18 NOTE — Progress Notes (Signed)
 PMT brief progress note  Discussed with [facility] MD colleague on 03/17/24. Chart reviewed.  Labs noted Patient awake alert, sitting up in bed, busy with her breakfast tray at time of visit, no distress.  Hgb 7.6 gm/dL Hematology to be formally consulted Is DNR Is from [facility] [place] [facility] and [facility] working to get SNF rehab approved for her facility upon discharge.  Recommend addition of outpatient palliative services at her facility [facility] for additional support.  Otherwise, continue scope of current hospitalization, as per hospital medicine. No new inpatient PMT specific recommendations at this time.  Low MDM Lonia Serve MD [facility].

## 2024-03-18 NOTE — Consult Note (Signed)
 [facility]  [phone] [phone]) [phone]    HEMATOLOGY CONSULTATION  PURPOSE OF CONSULTATION/CHIEF COMPLAINT:  Pancytopenia  Referring MD:  Dr. Judeth   HPI: [name]. [name] [name] is an 83 year old female patient who was admitted on 03/13/2024 with complaints of dark bloody stools and right upper quadrant pain.  Of note patient is on hospice and will likely return to SNF upon discharge.  Due to pancytopenia, hematology evaluation has been requested. Patient is seen resting comfortably in bed.  Awakens easily to verbal stimuli.  Patient was found to be COVID-positive on 9/12, currently on airborne precautions.  Discussed with nurse who states no rectal bleeding noted today.  She is chronically ill-appearing and pale. No complaints of pain.  Medical history includes COPD, hypertension, hyperlipidemia, lower GI bleed with diverticulosis, history of prior upper GI bleed, iron  deficiency anemia. Surgical history includes basal cell carcinoma excision from nose, polypectomy, arthroplasty, multiple GI procedures.  Family oncologic history includes father with bladder cancer.  Social history significant for former tobacco use x 37 years, quit in 1994,  former social alcohol /wine use, denies drug use.  Worked as a Runner, broadcasting/film/video.    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   Leukopenia -- WBC slightly low 3.7 Thrombocytopenia -- Mild.  Platelets 146K today -- No intervention required at this time.  -- Hematology/Dr. Tina will make further evaluation and treatment recommendations.   Rectal bleeding/Lower GI bleeding -- Patient admitted 9/13 with rectal bleeding -- Patient has history of previous LGI bleed due to diverticulosis -- Patient was on DNR comfort/hospice. However changed back to aggressive care.  -- Noted plan for discharge back to SNF.  History of Iron  deficiency anemia -- Iron   panel done 12/28/23 and found to have low ferritin 27 -- No further treatment recommended at this time  History of COPD -- Stable -- Monitor respiratory status  Covid 19 positive -- found to be positive on 9/12 -- No respiratory distress noted.  -- On precautions   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, left side   Sleeping excessive    Tinnitus of both ears   :  Past Surgical History:  Procedure Laterality Date   BALLOON DILATION N/A 05/08/2021   Procedure: BALLOON DILATION;  Surgeon: Saintclair Jasper, MD;  Location: [facility] ENDOSCOPY;  Service: Gastroenterology;  Laterality: N/A;   BASAL CELL CARCINOMA EXCISION  2001   off my nose, left side   BIOPSY  05/08/2021   Procedure: BIOPSY;  Surgeon: Saintclair Jasper, MD;  Location: [facility]OSCOPY;  Service: Gastroenterology;;   BIOPSY  04/25/2023   Procedure: BIOPSY;  Surgeon: Dianna Specking, MD;  Location: [facility] ENDOSCOPY;  Service: Gastroenterology;;   BLEPHAROPLASTY Bilateral    BONE BIOPSY  10/26/2023   Procedure: BIOPSY, GI;  Surgeon: Elicia,  Layla, MD;  Location: [facility] ENDOSCOPY;  Service: Gastroenterology;;   CATARACT EXTRACTION W/ INTRAOCULAR LENS  IMPLANT, BILATERAL Bilateral 1990's   COLONOSCOPY N/A 10/26/2023   Procedure: COLONOSCOPY;  Surgeon: Elicia Layla, MD;  Location: [facility] ENDOSCOPY;  Service: Gastroenterology;  Laterality: N/A;   COLONOSCOPY WITH PROPOFOL  N/A 05/04/2022   Procedure: COLONOSCOPY  WITH PROPOFOL ;  Surgeon: Saintclair Jasper, MD;  Location: [facility] ENDOSCOPY;  Service: Gastroenterology;  Laterality: N/A;   COLONOSCOPY WITH PROPOFOL  N/A 04/25/2023   Procedure: COLONOSCOPY WITH PROPOFOL ;  Surgeon: Dianna Specking, MD;  Location: [facility] ENDOSCOPY;  Service: Gastroenterology;  Laterality: N/A;   ENTEROSCOPY N/A 11/20/2023   Procedure: ENTEROSCOPY;  Surgeon: Dianna Specking, MD;  Location: [facility] ENDOSCOPY;  Service: Gastroenterology;  Laterality: N/A;   ESOPHAGOGASTRODUODENOSCOPY N/A 10/26/2023   Procedure: EGD (ESOPHAGOGASTRODUODENOSCOPY);  Surgeon: Elicia Layla, MD;  Location: [facility]OSCOPY;  Service: Gastroenterology;  Laterality: N/A;   ESOPHAGOGASTRODUODENOSCOPY N/A 11/20/2023   Procedure: EGD (ESOPHAGOGASTRODUODENOSCOPY);  Surgeon: Dianna Specking, MD;  Location: [facility]OSCOPY;  Service: Gastroenterology;  Laterality: N/A;   ESOPHAGOGASTRODUODENOSCOPY (EGD) WITH PROPOFOL  N/A 05/08/2021   Procedure: ESOPHAGOGASTRODUODENOSCOPY (EGD) WITH PROPOFOL ;  Surgeon: Saintclair Jasper, MD;  Location: [facility] ENDOSCOPY;  Service: Gastroenterology;  Laterality: N/A;   ESOPHAGOGASTRODUODENOSCOPY (EGD) WITH PROPOFOL  N/A 01/11/2022   Procedure: ESOPHAGOGASTRODUODENOSCOPY (EGD) WITH PROPOFOL ;  Surgeon: Rosalie Kitchens, MD;  Location: [facility] ENDOSCOPY;  Service: Gastroenterology;  Laterality: N/A;   ESOPHAGOGASTRODUODENOSCOPY (EGD) WITH PROPOFOL  N/A 01/06/2023   Procedure: ESOPHAGOGASTRODUODENOSCOPY (EGD) WITH PROPOFOL ;  Surgeon: Rosalie Kitchens, MD;  Location: [facility] ENDOSCOPY;  Service: Gastroenterology;  Laterality: N/A;   ESOPHAGOGASTRODUODENOSCOPY (EGD) WITH PROPOFOL  N/A 04/21/2023   Procedure: ESOPHAGOGASTRODUODENOSCOPY (EGD) WITH PROPOFOL ;  Surgeon: Dianna Specking, MD;  Location: [facility] ENDOSCOPY;  Service: Gastroenterology;  Laterality: N/A;   EYE SURGERY Bilateral    to improve vision after cataract OR   GIVENS CAPSULE STUDY N/A 04/21/2023   Procedure: GIVENS CAPSULE STUDY;  Surgeon: Dianna Specking, MD;  Location: [facility]  ENDOSCOPY;  Service: Gastroenterology;  Laterality: N/A;   GIVENS CAPSULE STUDY N/A 11/18/2023   Procedure: IMAGING PROCEDURE, GI TRACT, INTRALUMINAL, VIA CAPSULE;  Surgeon: Dianna Specking, MD;  Location: [facility] ENDOSCOPY;  Service: Gastroenterology;  Laterality: N/A;   HEMOSTASIS CLIP PLACEMENT  10/26/2023   Procedure: CONTROL OF HEMORRHAGE, GI TRACT, ENDOSCOPIC, BY CLIPPING OR OVERSEWING;  Surgeon: Elicia Layla, MD;  Location: [facility] ENDOSCOPY;  Service: Gastroenterology;;   IR ANGIOGRAM FOLLOW UP STUDY  12/19/2020   IR ANGIOGRAM SELECTIVE EACH ADDITIONAL VESSEL  12/19/2020   IR ANGIOGRAM SELECTIVE EACH ADDITIONAL VESSEL  12/19/2020   IR ANGIOGRAM SELECTIVE EACH ADDITIONAL VESSEL  12/28/2023   IR ANGIOGRAM SELECTIVE EACH ADDITIONAL VESSEL  03/14/2024   IR ANGIOGRAM SELECTIVE EACH ADDITIONAL VESSEL  03/14/2024   IR ANGIOGRAM SELECTIVE EACH ADDITIONAL VESSEL  03/14/2024   IR ANGIOGRAM VISCERAL SELECTIVE  12/19/2020   IR ANGIOGRAM VISCERAL SELECTIVE  12/28/2023   IR ANGIOGRAM VISCERAL SELECTIVE  03/14/2024   IR ANGIOGRAM VISCERAL SELECTIVE  03/14/2024   IR EMBO ART  VEN HEMORR LYMPH EXTRAV  INC GUIDE ROADMAPPING  12/19/2020   IR EMBO ART  VEN HEMORR LYMPH EXTRAV  INC GUIDE ROADMAPPING  12/28/2023   IR US  GUIDE VASC ACCESS RIGHT  12/19/2020   IR US  GUIDE VASC ACCESS RIGHT  12/28/2023   IR US  GUIDE VASC ACCESS RIGHT  03/14/2024   JOINT REPLACEMENT     POLYPECTOMY  05/04/2022   Procedure: POLYPECTOMY;  Surgeon: Saintclair Jasper, MD;  Location: [facility] ENDOSCOPY;  Service: Gastroenterology;;   POLYPECTOMY  10/26/2023  Procedure: POLYPECTOMY, INTESTINE;  Surgeon: Elicia Claw, MD;  Location: [facility] ENDOSCOPY;  Service: Gastroenterology;;   STAPEDES SURGERY Left    scraped stapedes because it was sticking when it wasn't suppose to   TONSILLECTOMY AND ADENOIDECTOMY  1946   TOTAL KNEE ARTHROPLASTY Left ~ 2008   TOTAL KNEE ARTHROPLASTY  12/20/2011   Procedure: TOTAL KNEE ARTHROPLASTY;  Surgeon: Elspeth JONELLE Her, MD;   Location: [facility] OR;  Service: Orthopedics;  Laterality: Right;  Right Total Knee Arthroplasty   TUBAL LIGATION  1980's  :  Allergies  Allergen Reactions   Morphine And Codeine Itching   Aspirin Other (See Comments)    Unknown reaction   Duloxetine  Hcl Other (See Comments)    Unknown reaction   Tymlos [Abaloparatide] Other (See Comments)    Unknown reaction   Ventolin  [Albuterol ] Other (See Comments)    rapid heart beat   Cefadroxil Hives    Patient can take amoxicillin  and cipro   :   Family History  Problem Relation Age of Onset   Heart attack Mother    Heart disease Father    Heart failure Father    Bladder Cancer Father    Supraventricular tachycardia Sister    Hypertension Sister   :   Social History   Socioeconomic History   Marital status: Married    Spouse name: Signe    Number of children: 3   Years of education: College   Highest education level: Not on file  Occupational History   Occupation: Retired Runner, broadcasting/film/video  Tobacco Use   Smoking status: Former    Current packs/day: 0.00    Average packs/day: 0.8 packs/day for 37.0 years (27.8 ttl pk-yrs)    Types: Cigarettes    Start date: 07/02/1955    Quit date: 07/01/1992    Years since quitting: 31.7    Passive exposure: Never   Smokeless tobacco: Never  Vaping Use   Vaping status: Never Used  Substance and Sexual Activity   Alcohol  use: Yes    Alcohol /week: 7.0 standard drinks of alcohol     Types: 7 Glasses of wine per week    Comment: 09/05/2017  6 oz wine, 5 days/wk   Drug use: No    Types: Hydrocodone    Sexual activity: Not Currently  Other Topics Concern   Not on file  Social History Narrative   1-2 cups of coffee a day    Social Drivers of Health   Financial Resource Strain: Low Risk  (04/04/2023)   Overall Financial Resource Strain (CARDIA)    Difficulty of Paying Living Expenses: Not hard at all  Food Insecurity: No Food Insecurity (03/14/2024)   Hunger Vital Sign    Worried About Running Out of  Food in the Last Year: Never true    Ran Out of Food in the Last Year: Never true  Transportation Needs: No Transportation Needs (03/14/2024)   PRAPARE - Administrator, Civil Service (Medical): No    Lack of Transportation (Non-Medical): No  Physical Activity: Insufficiently Active (04/04/2023)   Exercise Vital Sign    Days of Exercise per Week: 2 days    Minutes of Exercise per Session: 20 min  Stress: No Stress Concern Present (04/04/2023)   [employer] of Occupational Health - Occupational Stress Questionnaire    Feeling of Stress : Only a little  Social Connections: Socially Integrated (03/14/2024)   Social Connection and Isolation Panel    Frequency of Communication with Friends and Family: More than three times a  week    Frequency of Social Gatherings with Friends and Family: More than three times a week    Attends Religious Services: 1 to 4 times per year    Active Member of [employer] or Organizations: Yes    Attends Banker Meetings: 1 to 4 times per year    Marital Status: Married  Catering manager Violence: Not At Risk (03/14/2024)   Humiliation, Afraid, Rape, and Kick questionnaire    Fear of Current or Ex-Partner: No    Emotionally Abused: No    Physically Abused: No    Sexually Abused: No  :   CURRENT MEDS: Current Facility-Administered Medications  Medication Dose Route Frequency Provider Last Rate Last Admin   acetaminophen  (TYLENOL ) tablet 650 mg  650 mg Oral Q4H PRN Krishnan, Gokul, MD       Chlorhexidine  Gluconate Cloth 2 % PADS 6 each  6 each Topical Daily Dub Mancel HERO, MD   6 each at 03/18/24 0911   cyanocobalamin  (VITAMIN B12) tablet 1,000 mcg  1,000 mcg Oral Daily Krishnan, Gokul, MD   1,000 mcg at 03/18/24 9095   docusate sodium  (COLACE) capsule 100 mg  100 mg Oral BID PRN Albustami, Omar M, MD       dorzolamide -timolol  (COSOPT ) 2-0.5 % ophthalmic solution 1 drop  1 drop Both Eyes Daily Alghanim, Fahid, MD   1 drop at 03/18/24  0907   ferrous gluconate  (FERGON) tablet 324 mg  324 mg Oral Q breakfast Alghanim, Fahid, MD   324 mg at 03/18/24 0900   ipratropium-albuterol  (DUONEB) 0.5-2.5 (3) MG/3ML nebulizer solution 3 mL  3 mL Nebulization Q6H PRN Antonetta Moccasin B, NP       latanoprost  (XALATAN ) 0.005 % ophthalmic solution 1 drop  1 drop Both Eyes QHS Antonetta Moccasin B, NP   1 drop at 03/17/24 2143   multivitamin (PROSIGHT) tablet 1 tablet  1 tablet Oral Daily Alghanim, Fahid, MD   1 tablet at 03/18/24 0902   Netarsudil  Dimesylate 0.02 % SOLN 1 drop  1 drop Both Eyes QHS Antonetta Moccasin B, NP       oxybutynin  (DITROPAN -XL) 24 hr tablet 10 mg  10 mg Oral QHS Simpson, Paula B, NP   10 mg at 03/17/24 2143   pantoprazole  (PROTONIX ) EC tablet 40 mg  40 mg Oral BID AC Krishnan, Gokul, MD   40 mg at 03/18/24 0900   polyethylene glycol (MIRALAX  / GLYCOLAX ) packet 17 g  17 g Oral Daily PRN Albustami, Omar M, MD       rOPINIRole  (REQUIP ) tablet 0.5 mg  0.5 mg Oral Daily PRN Antonetta Moccasin B, NP       sertraline  (ZOLOFT ) tablet 75 mg  75 mg Oral Daily Antonetta Moccasin B, NP   75 mg at 03/18/24 0902   umeclidinium-vilanterol (ANORO ELLIPTA ) 62.5-25 MCG/ACT 1 puff  1 puff Inhalation Daily Antonetta Moccasin B, NP   1 puff at 03/18/24 0859    PHYSICAL EXAMINATION: ECOG PERFORMANCE STATUS: 3 - Symptomatic, >50% confined to bed  Vitals:   03/17/24 1302 03/17/24 2051  BP: (!) 104/52 112/60  Pulse: 72 76  Resp: 16 18  Temp: 98.5 F (36.9 C) 98.1 F (36.7 C)  SpO2: 98% 99%   Filed Weights   03/14/24 0125 03/17/24 0706  Weight: 168 lb 3.4 oz (76.3 kg) 173 lb 15.1 oz (78.9 kg)    GENERAL: alert, +frail  +chronically ill-appearing SKIN: +Pale skin color, texture, turgor are normal, no rashes or significant lesions EYES:  normal, conjunctiva are pink and non-injected, sclera clear OROPHARYNX: no exudate, no erythema and lips, buccal mucosa, and tongue normal  NECK: supple, thyroid  normal size, non-tender, without nodularity LYMPH: no  palpable lymphadenopathy in the cervical, axillary or inguinal LUNGS: clear to auscultation and percussion with normal breathing effort HEART: regular rate & rhythm and no murmurs and no lower extremity edema ABDOMEN: abdomen soft, non-tender and normal bowel sounds MUSCULOSKELETAL: no cyanosis of digits and no clubbing  PSYCH: alert & oriented x 2  NEURO: no focal motor/sensory deficits   LABS: Lab Results  Component Value Date   WBC 3.7 (L) 03/18/2024   HGB 7.6 (L) 03/18/2024   HCT 24.2 (L) 03/18/2024   MCV 99.6 03/18/2024   PLT 146 (L) 03/18/2024    Lab Results  Component Value Date   WBC 3.7 (L) 03/18/2024   HGB 7.6 (L) 03/18/2024   HCT 24.2 (L) 03/18/2024   PLT 146 (L) 03/18/2024   GLUCOSE 105 (H) 03/18/2024   CHOL 125 07/14/2023   TRIG 130 07/14/2023   HDL 51 07/14/2023   LDLCALC 53 07/14/2023   ALT 6 03/18/2024   AST 19 03/18/2024   NA 143 03/18/2024   K 3.6 03/18/2024   CL 108 03/18/2024   CREATININE 0.31 (L) 03/18/2024   BUN 7 (L) 03/18/2024   CO2 26 03/18/2024   INR 1.2 12/27/2023    IR US  Guide Vasc Access Right Result Date: 03/15/2024 CLINICAL DATA:  GI bleed. Active extravasation near the splenic flexure on recent CTA. Previous coil embolization x2 EXAM: IR ULTRASOUND GUIDANCE VASC ACCESS RIGHT; SELECTIVE VISCERAL ARTERIOGRAPHY; ADDITIONAL ARTERIOGRAPHY ANESTHESIA/SEDATION: Intravenous Fentanyl  50mcg and Versed  2mg  were administered by RN during a total moderate (conscious) sedation time of 35 minutes; the patient's level of consciousness and physiological / cardiorespiratory status were monitored continuously by radiology RN under my direct supervision. MEDICATIONS: Lidocaine  1% subcutaneous CONTRAST:  70mL OMNIPAQUE  IOHEXOL  300 MG/ML  SOLN PROCEDURE: The procedure, risks (including but not limited to bleeding, infection, organ damage ), benefits, and alternatives were explained to the patient. Questions regarding the procedure were encouraged and answered. The  patient understands and consents to the procedure. Right femoral region prepped and draped in usual sterile fashion. Maximal barrier sterile technique was utilized including caps, mask, sterile gowns, sterile gloves, sterile drape, hand hygiene and skin antiseptic. The right common femoral artery was localized under ultrasound. Under real-time ultrasound guidance, the vessel was accessed with a 21-gauge micropuncture needle, exchanged over a 018 guidewire for a transitional dilator, through which a 035 guidewire was advanced. Over this, a 5 [place] vascular sheath was placed, through which a 5 [place] RIM catheter was advanced and used to selectively catheterize the inferior mesenteric artery for selective arteriography in multiple projections. A coaxial Renegade microcatheter was advanced into two third-order branches of the IMA for selective arteriography. The microcatheter was removed. The RIM was exchanged for C2, utilized to selectively catheterize the superior mesenteric artery for selective arteriography in multiple projections. The catheter and sheath were removed and hemostasis achieved with the aid of the Celt device under ultrasound and fluoroscopic guidance. The patient tolerated the procedure well. COMPLICATIONS: None immediate FINDINGS: 2 sets of embolization coils from previous interventions noted in the IMA distribution at the level of the splenic flexure and proximal descending colon. No evidence of active extravasation, early draining vein, AVM, or other lesion to suggest a site or etiology of the patient's GI bleeding. IMPRESSION: 1. Negative mesenteric arteriogram. No evidence of active extravasation  or other focal lesion to suggest etiology of GI bleed. Electronically Signed   By: JONETTA Faes M.D.   On: 03/15/2024 14:46   IR Angiogram Visceral Selective Result Date: 03/15/2024 CLINICAL DATA:  GI bleed. Active extravasation near the splenic flexure on recent CTA. Previous coil embolization x2  EXAM: IR ULTRASOUND GUIDANCE VASC ACCESS RIGHT; SELECTIVE VISCERAL ARTERIOGRAPHY; ADDITIONAL ARTERIOGRAPHY ANESTHESIA/SEDATION: Intravenous Fentanyl  50mcg and Versed  2mg  were administered by RN during a total moderate (conscious) sedation time of 35 minutes; the patient's level of consciousness and physiological / cardiorespiratory status were monitored continuously by radiology RN under my direct supervision. MEDICATIONS: Lidocaine  1% subcutaneous CONTRAST:  70mL OMNIPAQUE  IOHEXOL  300 MG/ML  SOLN PROCEDURE: The procedure, risks (including but not limited to bleeding, infection, organ damage ), benefits, and alternatives were explained to the patient. Questions regarding the procedure were encouraged and answered. The patient understands and consents to the procedure. Right femoral region prepped and draped in usual sterile fashion. Maximal barrier sterile technique was utilized including caps, mask, sterile gowns, sterile gloves, sterile drape, hand hygiene and skin antiseptic. The right common femoral artery was localized under ultrasound. Under real-time ultrasound guidance, the vessel was accessed with a 21-gauge micropuncture needle, exchanged over a 018 guidewire for a transitional dilator, through which a 035 guidewire was advanced. Over this, a 5 [place] vascular sheath was placed, through which a 5 [place] RIM catheter was advanced and used to selectively catheterize the inferior mesenteric artery for selective arteriography in multiple projections. A coaxial Renegade microcatheter was advanced into two third-order branches of the IMA for selective arteriography. The microcatheter was removed. The RIM was exchanged for C2, utilized to selectively catheterize the superior mesenteric artery for selective arteriography in multiple projections. The catheter and sheath were removed and hemostasis achieved with the aid of the Celt device under ultrasound and fluoroscopic guidance. The patient tolerated the  procedure well. COMPLICATIONS: None immediate FINDINGS: 2 sets of embolization coils from previous interventions noted in the IMA distribution at the level of the splenic flexure and proximal descending colon. No evidence of active extravasation, early draining vein, AVM, or other lesion to suggest a site or etiology of the patient's GI bleeding. IMPRESSION: 1. Negative mesenteric arteriogram. No evidence of active extravasation or other focal lesion to suggest etiology of GI bleed. Electronically Signed   By: JONETTA Faes M.D.   On: 03/15/2024 14:46   IR Angiogram Selective Each Additional Vessel Result Date: 03/15/2024 CLINICAL DATA:  GI bleed. Active extravasation near the splenic flexure on recent CTA. Previous coil embolization x2 EXAM: IR ULTRASOUND GUIDANCE VASC ACCESS RIGHT; SELECTIVE VISCERAL ARTERIOGRAPHY; ADDITIONAL ARTERIOGRAPHY ANESTHESIA/SEDATION: Intravenous Fentanyl  50mcg and Versed  2mg  were administered by RN during a total moderate (conscious) sedation time of 35 minutes; the patient's level of consciousness and physiological / cardiorespiratory status were monitored continuously by radiology RN under my direct supervision. MEDICATIONS: Lidocaine  1% subcutaneous CONTRAST:  70mL OMNIPAQUE  IOHEXOL  300 MG/ML  SOLN PROCEDURE: The procedure, risks (including but not limited to bleeding, infection, organ damage ), benefits, and alternatives were explained to the patient. Questions regarding the procedure were encouraged and answered. The patient understands and consents to the procedure. Right femoral region prepped and draped in usual sterile fashion. Maximal barrier sterile technique was utilized including caps, mask, sterile gowns, sterile gloves, sterile drape, hand hygiene and skin antiseptic. The right common femoral artery was localized under ultrasound. Under real-time ultrasound guidance, the vessel was accessed with a 21-gauge micropuncture needle, exchanged over a 018 guidewire  for a  transitional dilator, through which a 035 guidewire was advanced. Over this, a 5 [place] vascular sheath was placed, through which a 5 [place] RIM catheter was advanced and used to selectively catheterize the inferior mesenteric artery for selective arteriography in multiple projections. A coaxial Renegade microcatheter was advanced into two third-order branches of the IMA for selective arteriography. The microcatheter was removed. The RIM was exchanged for C2, utilized to selectively catheterize the superior mesenteric artery for selective arteriography in multiple projections. The catheter and sheath were removed and hemostasis achieved with the aid of the Celt device under ultrasound and fluoroscopic guidance. The patient tolerated the procedure well. COMPLICATIONS: None immediate FINDINGS: 2 sets of embolization coils from previous interventions noted in the IMA distribution at the level of the splenic flexure and proximal descending colon. No evidence of active extravasation, early draining vein, AVM, or other lesion to suggest a site or etiology of the patient's GI bleeding. IMPRESSION: 1. Negative mesenteric arteriogram. No evidence of active extravasation or other focal lesion to suggest etiology of GI bleed. Electronically Signed   By: JONETTA Faes M.D.   On: 03/15/2024 14:46   IR Angiogram Selective Each Additional Vessel Result Date: 03/15/2024 CLINICAL DATA:  GI bleed. Active extravasation near the splenic flexure on recent CTA. Previous coil embolization x2 EXAM: IR ULTRASOUND GUIDANCE VASC ACCESS RIGHT; SELECTIVE VISCERAL ARTERIOGRAPHY; ADDITIONAL ARTERIOGRAPHY ANESTHESIA/SEDATION: Intravenous Fentanyl  50mcg and Versed  2mg  were administered by RN during a total moderate (conscious) sedation time of 35 minutes; the patient's level of consciousness and physiological / cardiorespiratory status were monitored continuously by radiology RN under my direct supervision. MEDICATIONS: Lidocaine  1% subcutaneous  CONTRAST:  70mL OMNIPAQUE  IOHEXOL  300 MG/ML  SOLN PROCEDURE: The procedure, risks (including but not limited to bleeding, infection, organ damage ), benefits, and alternatives were explained to the patient. Questions regarding the procedure were encouraged and answered. The patient understands and consents to the procedure. Right femoral region prepped and draped in usual sterile fashion. Maximal barrier sterile technique was utilized including caps, mask, sterile gowns, sterile gloves, sterile drape, hand hygiene and skin antiseptic. The right common femoral artery was localized under ultrasound. Under real-time ultrasound guidance, the vessel was accessed with a 21-gauge micropuncture needle, exchanged over a 018 guidewire for a transitional dilator, through which a 035 guidewire was advanced. Over this, a 5 [place] vascular sheath was placed, through which a 5 [place] RIM catheter was advanced and used to selectively catheterize the inferior mesenteric artery for selective arteriography in multiple projections. A coaxial Renegade microcatheter was advanced into two third-order branches of the IMA for selective arteriography. The microcatheter was removed. The RIM was exchanged for C2, utilized to selectively catheterize the superior mesenteric artery for selective arteriography in multiple projections. The catheter and sheath were removed and hemostasis achieved with the aid of the Celt device under ultrasound and fluoroscopic guidance. The patient tolerated the procedure well. COMPLICATIONS: None immediate FINDINGS: 2 sets of embolization coils from previous interventions noted in the IMA distribution at the level of the splenic flexure and proximal descending colon. No evidence of active extravasation, early draining vein, AVM, or other lesion to suggest a site or etiology of the patient's GI bleeding. IMPRESSION: 1. Negative mesenteric arteriogram. No evidence of active extravasation or other focal lesion to  suggest etiology of GI bleed. Electronically Signed   By: JONETTA Faes M.D.   On: 03/15/2024 14:46   IR Angiogram Selective Each Additional Vessel Result Date: 03/15/2024 CLINICAL DATA:  GI  bleed. Active extravasation near the splenic flexure on recent CTA. Previous coil embolization x2 EXAM: IR ULTRASOUND GUIDANCE VASC ACCESS RIGHT; SELECTIVE VISCERAL ARTERIOGRAPHY; ADDITIONAL ARTERIOGRAPHY ANESTHESIA/SEDATION: Intravenous Fentanyl  50mcg and Versed  2mg  were administered by RN during a total moderate (conscious) sedation time of 35 minutes; the patient's level of consciousness and physiological / cardiorespiratory status were monitored continuously by radiology RN under my direct supervision. MEDICATIONS: Lidocaine  1% subcutaneous CONTRAST:  70mL OMNIPAQUE  IOHEXOL  300 MG/ML  SOLN PROCEDURE: The procedure, risks (including but not limited to bleeding, infection, organ damage ), benefits, and alternatives were explained to the patient. Questions regarding the procedure were encouraged and answered. The patient understands and consents to the procedure. Right femoral region prepped and draped in usual sterile fashion. Maximal barrier sterile technique was utilized including caps, mask, sterile gowns, sterile gloves, sterile drape, hand hygiene and skin antiseptic. The right common femoral artery was localized under ultrasound. Under real-time ultrasound guidance, the vessel was accessed with a 21-gauge micropuncture needle, exchanged over a 018 guidewire for a transitional dilator, through which a 035 guidewire was advanced. Over this, a 5 [place] vascular sheath was placed, through which a 5 [place] RIM catheter was advanced and used to selectively catheterize the inferior mesenteric artery for selective arteriography in multiple projections. A coaxial Renegade microcatheter was advanced into two third-order branches of the IMA for selective arteriography. The microcatheter was removed. The RIM was exchanged for C2,  utilized to selectively catheterize the superior mesenteric artery for selective arteriography in multiple projections. The catheter and sheath were removed and hemostasis achieved with the aid of the Celt device under ultrasound and fluoroscopic guidance. The patient tolerated the procedure well. COMPLICATIONS: None immediate FINDINGS: 2 sets of embolization coils from previous interventions noted in the IMA distribution at the level of the splenic flexure and proximal descending colon. No evidence of active extravasation, early draining vein, AVM, or other lesion to suggest a site or etiology of the patient's GI bleeding. IMPRESSION: 1. Negative mesenteric arteriogram. No evidence of active extravasation or other focal lesion to suggest etiology of GI bleed. Electronically Signed   By: JONETTA Faes M.D.   On: 03/15/2024 14:46   IR Angiogram Visceral Selective Result Date: 03/15/2024 CLINICAL DATA:  GI bleed. Active extravasation near the splenic flexure on recent CTA. Previous coil embolization x2 EXAM: IR ULTRASOUND GUIDANCE VASC ACCESS RIGHT; SELECTIVE VISCERAL ARTERIOGRAPHY; ADDITIONAL ARTERIOGRAPHY ANESTHESIA/SEDATION: Intravenous Fentanyl  50mcg and Versed  2mg  were administered by RN during a total moderate (conscious) sedation time of 35 minutes; the patient's level of consciousness and physiological / cardiorespiratory status were monitored continuously by radiology RN under my direct supervision. MEDICATIONS: Lidocaine  1% subcutaneous CONTRAST:  70mL OMNIPAQUE  IOHEXOL  300 MG/ML  SOLN PROCEDURE: The procedure, risks (including but not limited to bleeding, infection, organ damage ), benefits, and alternatives were explained to the patient. Questions regarding the procedure were encouraged and answered. The patient understands and consents to the procedure. Right femoral region prepped and draped in usual sterile fashion. Maximal barrier sterile technique was utilized including caps, mask, sterile gowns,  sterile gloves, sterile drape, hand hygiene and skin antiseptic. The right common femoral artery was localized under ultrasound. Under real-time ultrasound guidance, the vessel was accessed with a 21-gauge micropuncture needle, exchanged over a 018 guidewire for a transitional dilator, through which a 035 guidewire was advanced. Over this, a 5 [place] vascular sheath was placed, through which a 5 [place] RIM catheter was advanced and used to selectively catheterize the inferior mesenteric artery for  selective arteriography in multiple projections. A coaxial Renegade microcatheter was advanced into two third-order branches of the IMA for selective arteriography. The microcatheter was removed. The RIM was exchanged for C2, utilized to selectively catheterize the superior mesenteric artery for selective arteriography in multiple projections. The catheter and sheath were removed and hemostasis achieved with the aid of the Celt device under ultrasound and fluoroscopic guidance. The patient tolerated the procedure well. COMPLICATIONS: None immediate FINDINGS: 2 sets of embolization coils from previous interventions noted in the IMA distribution at the level of the splenic flexure and proximal descending colon. No evidence of active extravasation, early draining vein, AVM, or other lesion to suggest a site or etiology of the patient's GI bleeding. IMPRESSION: 1. Negative mesenteric arteriogram. No evidence of active extravasation or other focal lesion to suggest etiology of GI bleed. Electronically Signed   By: JONETTA Faes M.D.   On: 03/15/2024 14:46   CT Angio Abd/Pel W and/or Wo Contrast Result Date: 03/13/2024 CLINICAL DATA:  Lower gastrointestinal bleeding EXAM: CTA ABDOMEN AND PELVIS WITHOUT AND WITH CONTRAST TECHNIQUE: Multidetector CT imaging of the abdomen and pelvis was performed using the standard protocol during bolus administration of intravenous contrast. Multiplanar reconstructed images and MIPs were obtained  and reviewed to evaluate the vascular anatomy. RADIATION DOSE REDUCTION: This exam was performed according to the departmental dose-optimization program which includes automated exposure control, adjustment of the mA and/or kV according to patient size and/or use of iterative reconstruction technique. CONTRAST:  OMNIPAQUE  IOHEXOL  350 MG/ML SOLN COMPARISON:  08/17/2018, 12/27/2023 FINDINGS: VASCULAR Aorta: Normal caliber aorta without aneurysm, dissection, vasculitis or significant stenosis. Stable atherosclerosis. Celiac: Patent without evidence of aneurysm, dissection, vasculitis or significant stenosis. SMA: Patent without evidence of aneurysm, dissection, vasculitis or significant stenosis. Renals: Both renal arteries are patent without evidence of aneurysm, dissection, vasculitis, or fibromuscular dysplasia. Stable mild stenosis of both right renal artery, with wide patency of the left renal artery. IMA: Patent without evidence of aneurysm, dissection, vasculitis or significant stenosis. Embolic coils are seen within branches of the IMA near the splenic flexure of the colon and proximal descending colon. Inflow: Patent without evidence of aneurysm, dissection, vasculitis or significant stenosis. Proximal Outflow: Bilateral common femoral and visualized portions of the superficial and profunda femoral arteries are patent without evidence of aneurysm, dissection, vasculitis or significant stenosis. Veins: No obvious venous abnormality within the limitations of this arterial phase study. Review of the MIP images confirms the above findings. NON-VASCULAR Lower chest: No acute pleural or parenchymal lung disease. Hepatobiliary: Cholelithiasis without cholecystitis. Mild hepatic steatosis. No focal liver abnormality. Pancreas: Unremarkable. No pancreatic ductal dilatation or surrounding inflammatory changes. Spleen: Normal in size without focal abnormality. Adrenals/Urinary Tract: Stable left extrarenal pelvis.  The kidneys enhance normally. The adrenals and bladder are unremarkable. Stomach/Bowel: There is intraluminal accumulation of contrast within the distal transverse colon and splenic flexure of the colon, consistent with acute gastrointestinal hemorrhage. Hemorrhage is likely due to extensive diverticulosis in this region. This is slightly proximal to the location of the hemorrhage seen on prior CT exam. No bowel obstruction or ileus. Diffuse colonic diverticulosis with no evidence of acute diverticulitis. Normal appendix right lower quadrant. Lymphatic: No pathologic adenopathy. Reproductive: Uterus and bilateral adnexa are unremarkable. Other: No free fluid or free intraperitoneal gas. No abdominal wall hernia. Musculoskeletal: No acute or destructive bony abnormalities. Chronic L1 compression deformity again noted. Reconstructed images demonstrate no additional findings. IMPRESSION: VASCULAR 1. Active gastrointestinal hemorrhage within the distal transverse colon and  splenic flexure of the colon, likely due to diverticulosis. 2.  Aortic Atherosclerosis (ICD10-I70.0). 3. 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 NON-VASCULAR 1. Cholelithiasis without cholecystitis. 2. Diverticulosis without diverticulitis. Critical Value/emergent results were called by telephone at the time of interpretation on 03/13/2024 at 10:01 pm to provider [facility] , who verbally acknowledged these results. Electronically Signed   By: Ozell Daring M.D.   On: 03/13/2024 22:03     The total time spent in the appointment was 55 minutes encounter with patients including review of chart and various tests results, discussions about plan of care and coordination of care plan   All questions were answered. The patient knows to call the clinic with any problems, questions or concerns. No barriers to learning was detected.  Thank you for the courtesy of this  consultation, Olam JINNY Brunner, NP  9/18/202511:36 AM

## 2024-03-19 ENCOUNTER — Non-Acute Institutional Stay (SKILLED_NURSING_FACILITY): Payer: Self-pay | Admitting: Nurse Practitioner

## 2024-03-19 ENCOUNTER — Encounter: Payer: Self-pay | Admitting: Nurse Practitioner

## 2024-03-19 DIAGNOSIS — D649 Anemia, unspecified: Secondary | ICD-10-CM | POA: Diagnosis not present

## 2024-03-19 DIAGNOSIS — R911 Solitary pulmonary nodule: Secondary | ICD-10-CM | POA: Diagnosis not present

## 2024-03-19 DIAGNOSIS — M810 Age-related osteoporosis without current pathological fracture: Secondary | ICD-10-CM

## 2024-03-19 DIAGNOSIS — F419 Anxiety disorder, unspecified: Secondary | ICD-10-CM

## 2024-03-19 DIAGNOSIS — G2581 Restless legs syndrome: Secondary | ICD-10-CM | POA: Diagnosis not present

## 2024-03-19 DIAGNOSIS — N3946 Mixed incontinence: Secondary | ICD-10-CM | POA: Diagnosis not present

## 2024-03-19 NOTE — Assessment & Plan Note (Signed)
Stable,  takes Sertraline

## 2024-03-19 NOTE — Assessment & Plan Note (Signed)
Blood pressure is controlled,  takes Torsemide °           °

## 2024-03-19 NOTE — Assessment & Plan Note (Signed)
Pulmonary nodule pretracheal lymph node 1.3x1.2 cm CT chest 08/09/21

## 2024-03-19 NOTE — Assessment & Plan Note (Signed)
 takes Pantoprazole  bid. Followed by GI

## 2024-03-19 NOTE — Assessment & Plan Note (Addendum)
 Hospitalized 03/13/2024 to 03/18/2024 for acute on chronic anemia, lower GI bleed, thrombocytopenia, needs repeat CBC differential and CMP/eGFR, Hgb 7.6 03/18/2024(transfused due to he Hgb 6.9 03/14/2024).  Underwent mesenteric angiogram to attempt another coiling but no active bleeding was identified Updated CBC/differential, CMP/eGFR 03/23/2024 Continue iron  and vitamin B-12 supplement May consider hematology evaluation as outpatient if desired

## 2024-03-19 NOTE — Progress Notes (Signed)
 Location:   SNF [facility] Room Number: 25 Place of Service:  SNF (31) Provider: [facility] Kwamaine Cuppett NP  Gil Greig BRAVO, NP  Patient Care Team: Gil Greig BRAVO, NP as PCP - General (Adult Health Nurse Practitioner) Charlanne Fredia CROME, MD as Consulting Physician (Internal Medicine) Apickup-Ot, A, OT as Occupational Therapist (Occupational Therapy) Pccm, Md, MD (Pulmonary Disease)  Extended Emergency Contact Information Primary Emergency Contact: Chokshi,[name] Address: [address]          [place], [place] [address] [place] Home Phone: [phone] Mobile Phone: [phone] Relation: Spouse Secondary Emergency Contact: Bordley MD,[name] Ozell Crane Phone: [phone] Relation: Son Interpreter needed? No  Code Status: DNR Goals of care: Advanced Directive information    03/13/2024    8:28 PM  Advanced Directives  Does Patient Have a Medical Advance Directive? No  Does patient want to make changes to medical advance directive? No - Patient declined  Would patient like information on creating a medical advance directive? No - Patient declined     Chief Complaint  Patient presents with   Acute Visit    Medication review of following hospital stay    HPI:  Pt is a 83 y.o. female seen today for an acute visit for medication review following hospital stay    Hospitalized 03/13/2024 to 03/18/2024 for acute on chronic anemia, lower GI bleed, thrombocytopenia, needs repeat CBC differential and CMP/eGFR, Hgb 7.6 03/18/2024(transfused due to he Hgb 6.9 03/14/2024).  Underwent mesenteric angiogram to attempt another coiling but no active bleeding was identified  Hospitalized 12/27/2023 to 01/01/2024 for lower GI bleeding, CTA with actively bleeding in the descending colon C/W acute diverticular bleeding.  GI was consulted and IR performed embolization.  Status post 2 units PRBC 2/2 Hgb 6.4 Hospitalized 11/18/23-11/26/23 for lower GI bleed, hx of diverticulosis with bleeding, Hx of  multiple GI bleed, 09/2023 Colonoscopy/ EGD showed gastritis and ischemic colitis,  transfused due to Hgb 5.3.                GERD, takes Pantoprazole  bid. 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, takes Ellipta, Claritin , prn Albuterol              Pulmonary nodule pretracheal lymph node 1.3x1.2 cm CT chest 08/09/21, file follow-up chest CT due to deconditioning             HTN takes Torsemide              HLD takes Zocor              Anxiety/depression, takes Sertraline              OP takes Fosamax, Vit D             OA multiple sites, chronic right shoulder/R arm pain(rotator cuff injury), takes Tylenol              RLS, takes Requip              Urinary incontinence, on Oxybutynin              CHF mild edema BLE, taking Torsemide , Bun/creat 7/0.31 03/18/2024 Past Medical History:  Diagnosis Date   Anxiety    Arthritis    bilateral knees, shoulders, elbows; neck, pretty  widespread (09/05/2017)   BPPV (benign paroxysmal positional vertigo)    Depression    GERD (gastroesophageal reflux disease)    Glaucoma, both eyes    Headache    probably 2/month (09/05/2017)   History of blood transfusion ~ 2008   related to LGIB   Hyperlipemia    Lower GI bleeding ~ 2008; 09/05/2017   had to have blood transfusion   Macular degeneration, bilateral    Osteopenia    Seasonal allergies    Skin cancer, 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, left side   Sleeping excessive    Tinnitus of both ears    Past Surgical History:  Procedure Laterality Date   BALLOON DILATION N/A 05/08/2021   Procedure: BALLOON DILATION;  Surgeon: Saintclair Jasper, MD;  [facility] ENDOSCOPY;  Service: Gastroenterology;  Laterality: N/A;   BASAL CELL CARCINOMA EXCISION  2001   off my nose, left side   BIOPSY  05/08/2021   Procedure: BIOPSY;  Surgeon: Saintclair Jasper, MD;   Location: [facility] ENDOSCOPY;  Service: Gastroenterology;;   BIOPSY  04/25/2023   Procedure: BIOPSY;  Surgeon: Dianna Specking, MD;  Location: [facility] ENDOSCOPY;  Service: Gastroenterology;;   BLEPHAROPLASTY Bilateral    BONE BIOPSY  10/26/2023   Procedure: BIOPSY, GI;  Surgeon: Elicia Claw, MD;  Location: [facility] ENDOSCOPY;  Service: Gastroenterology;;   CATARACT EXTRACTION W/ INTRAOCULAR LENS  IMPLANT, BILATERAL Bilateral 1990's   COLONOSCOPY N/A 10/26/2023   Procedure: COLONOSCOPY;  Surgeon: Elicia Claw, MD;  Location: [facility] ENDOSCOPY;  Service: Gastroenterology;  Laterality: N/A;   COLONOSCOPY WITH PROPOFOL  N/A 05/04/2022   Procedure: COLONOSCOPY WITH PROPOFOL ;  Surgeon: Saintclair Jasper, MD;  Location: [facility] ENDOSCOPY;  Service: Gastroenterology;  Laterality: N/A;   COLONOSCOPY WITH PROPOFOL  N/A 04/25/2023   Procedure: COLONOSCOPY WITH PROPOFOL ;  Surgeon: Dianna Specking, MD;  Location: [facility] ENDOSCOPY;  Service: Gastroenterology;  Laterality: N/A;   ENTEROSCOPY N/A 11/20/2023   Procedure: ENTEROSCOPY;  Surgeon: Dianna Specking, MD;  Location: [facility] ENDOSCOPY;  Service: Gastroenterology;  Laterality: N/A;   ESOPHAGOGASTRODUODENOSCOPY N/A 10/26/2023   Procedure: EGD (ESOPHAGOGASTRODUODENOSCOPY);  Surgeon: Elicia Claw, MD;  Location: [facility] ENDOSCOPY;  Service: Gastroenterology;  Laterality: N/A;   ESOPHAGOGASTRODUODENOSCOPY N/A 11/20/2023   Procedure: EGD (ESOPHAGOGASTRODUODENOSCOPY);  Surgeon: Dianna Specking, MD;  Location: [facility] ENDOSCOPY;  Service: Gastroenterology;  Laterality: N/A;   ESOPHAGOGASTRODUODENOSCOPY (EGD) WITH PROPOFOL  N/A 05/08/2021   Procedure: ESOPHAGOGASTRODUODENOSCOPY (EGD) WITH PROPOFOL ;  Surgeon: Saintclair Jasper, MD;  Location: [facility] ENDOSCOPY;  Service: Gastroenterology;  Laterality: N/A;   ESOPHAGOGASTRODUODENOSCOPY (EGD) WITH PROPOFOL  N/A 01/11/2022   Procedure: ESOPHAGOGASTRODUODENOSCOPY (EGD) WITH PROPOFOL ;  Surgeon: Rosalie Kitchens, MD;  Location: [facility] ENDOSCOPY;  Service: Gastroenterology;   Laterality: N/A;   ESOPHAGOGASTRODUODENOSCOPY (EGD) WITH PROPOFOL  N/A 01/06/2023   Procedure: ESOPHAGOGASTRODUODENOSCOPY (EGD) WITH PROPOFOL ;  Surgeon: Rosalie Kitchens, MD;  Location: [facility] ENDOSCOPY;  Service: Gastroenterology;  Laterality: N/A;   ESOPHAGOGASTRODUODENOSCOPY (EGD) WITH PROPOFOL  N/A 04/21/2023   Procedure: ESOPHAGOGASTRODUODENOSCOPY (EGD) WITH PROPOFOL ;  Surgeon: Dianna Specking, MD;  Location: [facility] ENDOSCOPY;  Service: Gastroenterology;  Laterality: N/A;   EYE SURGERY Bilateral    to improve vision after cataract OR   GIVENS CAPSULE STUDY N/A 04/21/2023   Procedure: GIVENS CAPSULE STUDY;  Surgeon: Dianna Specking, MD;  Location: [facility] ENDOSCOPY;  Service: Gastroenterology;  Laterality: N/A;   GIVENS CAPSULE STUDY N/A 11/18/2023   Procedure: IMAGING PROCEDURE, GI TRACT, INTRALUMINAL, VIA CAPSULE;  Surgeon: Dianna Specking, MD;  Location: [facility] ENDOSCOPY;  Service: Gastroenterology;  Laterality: N/A;   HEMOSTASIS CLIP PLACEMENT  10/26/2023  Procedure: CONTROL OF HEMORRHAGE, GI TRACT, ENDOSCOPIC, BY CLIPPING OR OVERSEWING;  Surgeon: Elicia Claw, MD;  Location: [facility] ENDOSCOPY;  Service: Gastroenterology;;   IR ANGIOGRAM FOLLOW UP STUDY  12/19/2020   IR ANGIOGRAM SELECTIVE EACH ADDITIONAL VESSEL  12/19/2020   IR ANGIOGRAM SELECTIVE EACH ADDITIONAL VESSEL  12/19/2020   IR ANGIOGRAM SELECTIVE EACH ADDITIONAL VESSEL  12/28/2023   IR ANGIOGRAM SELECTIVE EACH ADDITIONAL VESSEL  03/14/2024   IR ANGIOGRAM SELECTIVE EACH ADDITIONAL VESSEL  03/14/2024   IR ANGIOGRAM SELECTIVE EACH ADDITIONAL VESSEL  03/14/2024   IR ANGIOGRAM VISCERAL SELECTIVE  12/19/2020   IR ANGIOGRAM VISCERAL SELECTIVE  12/28/2023   IR ANGIOGRAM VISCERAL SELECTIVE  03/14/2024   IR ANGIOGRAM VISCERAL SELECTIVE  03/14/2024   IR EMBO ART  VEN HEMORR LYMPH EXTRAV  INC GUIDE ROADMAPPING  12/19/2020   IR EMBO ART  VEN HEMORR LYMPH EXTRAV  INC GUIDE ROADMAPPING  12/28/2023   IR US  GUIDE VASC ACCESS RIGHT  12/19/2020   IR US  GUIDE VASC ACCESS  RIGHT  12/28/2023   IR US  GUIDE VASC ACCESS RIGHT  03/14/2024   JOINT REPLACEMENT     POLYPECTOMY  05/04/2022   Procedure: POLYPECTOMY;  Surgeon: Saintclair Jasper, MD;  Location: [facility] ENDOSCOPY;  Service: Gastroenterology;;   POLYPECTOMY  10/26/2023   Procedure: POLYPECTOMY, INTESTINE;  Surgeon: Elicia Claw, MD;  Location: [facility] ENDOSCOPY;  Service: Gastroenterology;;   STAPEDES SURGERY Left    scraped stapedes because it was sticking when it wasn't suppose to   TONSILLECTOMY AND ADENOIDECTOMY  1946   TOTAL KNEE ARTHROPLASTY Left ~ 2008   TOTAL KNEE ARTHROPLASTY  12/20/2011   Procedure: TOTAL KNEE ARTHROPLASTY;  Surgeon: Elspeth JONELLE Her, MD;  Location: [facility] OR;  Service: Orthopedics;  Laterality: Right;  Right Total Knee Arthroplasty   TUBAL LIGATION  1980's    Allergies  Allergen Reactions   Morphine And Codeine Itching   Aspirin Other (See Comments)    Unknown reaction   Duloxetine  Hcl Other (See Comments)    Unknown reaction   Tymlos [Abaloparatide] Other (See Comments)    Unknown reaction   Ventolin  [Albuterol ] Other (See Comments)    rapid heart beat   Cefadroxil Hives    Patient can take amoxicillin  and cipro     Allergies as of 03/19/2024       Reactions   Morphine And Codeine Itching   Aspirin Other (See Comments)   Unknown reaction   Duloxetine  Hcl Other (See Comments)   Unknown reaction   Tymlos [abaloparatide] Other (See Comments)   Unknown reaction   Ventolin  [albuterol ] Other (See Comments)   rapid heart beat   Cefadroxil Hives   Patient can take amoxicillin  and cipro         Medication List        Accurate as of March 19, 2024 11:59 PM. If you have any questions, ask your nurse or doctor.          albuterol  108 (90 Base) MCG/ACT inhaler Commonly known as: VENTOLIN  HFA Inhale 2 puffs into the lungs every 4 (four) hours as needed for wheezing or shortness of breath.   alendronate 70 MG tablet Commonly known as: FOSAMAX Take 70 mg by mouth every  Sunday. Take with a full glass of water on an empty stomach.   Align 4 MG Caps Take 4 mg by mouth at bedtime.   Anoro Ellipta  62.5-25 MCG/ACT Aepb Generic drug: umeclidinium-vilanterol Inhale 1 puff into the lungs every morning.   cetirizine  5 MG chewable tablet Commonly  known as: ZYRTEC  Chew 5 mg by mouth daily as needed for allergies.   cyanocobalamin  1000 MCG tablet Commonly known as: VITAMIN B12 Take 1,000 mcg by mouth daily.   Dorzolamide  HCl-Timolol  Mal PF 2-0.5 % Soln Place 1 drop into both eyes in the morning and at bedtime.   ferrous gluconate  240 (27 FE) MG tablet Commonly known as: FERGON Take 240 mg by mouth every morning. Take without food   fluticasone  50 MCG/ACT nasal spray Commonly known as: FLONASE  Place 1 spray into both nostrils as needed for allergies or rhinitis.   guaifenesin  100 MG/5ML syrup Commonly known as: ROBITUSSIN Take 10 mLs by mouth every 4 (four) hours as needed for cough.   latanoprost  0.005 % ophthalmic solution Commonly known as: XALATAN  Place 1 drop into both eyes at bedtime.   loperamide  2 MG tablet Commonly known as: IMODIUM  A-D Take 4 mg by mouth as needed for diarrhea or loose stools.   MIRALAX  PO Take 8.6 g by mouth daily as needed.   oxybutynin  10 MG 24 hr tablet Commonly known as: DITROPAN -XL Take 10 mg by mouth at bedtime.   pantoprazole  40 MG tablet Commonly known as: PROTONIX  Take 1 tablet (40 mg total) by mouth 2 (two) times daily before a meal.   potassium chloride  SA 20 MEQ tablet Commonly known as: KLOR-CON  M Take 20 mEq by mouth daily.   PreserVision AREDS 2 Caps Take 1 capsule by mouth 2 (two) times daily.   PreviDent 5000 Booster Plus 1.1 % Pste Generic drug: Sodium Fluoride  Place 1 Application onto teeth See admin instructions. Brush on teeth with a toothbrush after evening mouth care. Spit out excess and do not rinse.   Rhopressa  0.02 % Soln Generic drug: Netarsudil  Dimesylate Place 1 drop into  both eyes at bedtime.   rOPINIRole  0.5 MG tablet Commonly known as: REQUIP  Take 1 tablet (0.5 mg total) by mouth daily as needed.   senna 8.6 MG Tabs tablet Commonly known as: SENOKOT Take 2 tablets by mouth at bedtime.   sertraline  50 MG tablet Commonly known as: ZOLOFT  Take 1.5 tablets (75 mg total) by mouth daily.   simvastatin  20 MG tablet Commonly known as: ZOCOR  Take 20 mg by mouth every evening. HOLD WHILE ON PAXLOVID    torsemide  20 MG tablet Commonly known as: DEMADEX  Take 20 mg by mouth every morning.   Tylenol  325 MG Caps Generic drug: Acetaminophen  Take 650 mg by mouth every 6 (six) hours as needed (pain, headache or fever.).   vitamin C  1000 MG tablet Take 1,000 mg by mouth every morning.   Vitamin D3 50 MCG (2000 UT) Tabs Take 2,000 Units by mouth every morning.   zinc oxide 20 % ointment Apply 1 Application topically as needed for irritation.        Review of Systems  Constitutional:  Positive for fatigue. Negative for activity change and fever.  HENT:  Positive for hearing loss. Negative for congestion and trouble swallowing.   Eyes:  Negative for visual disturbance.  Respiratory:  Positive for shortness of breath. Negative for cough and wheezing.   Cardiovascular:  Positive for leg swelling.  Gastrointestinal:  Negative for abdominal pain, blood in stool and constipation.  Genitourinary:  Positive for frequency. Negative for difficulty urinating, dysuria, flank pain and urgency.  Musculoskeletal:  Positive for arthralgias and gait problem.  Skin:  Negative for color change.  Neurological:  Negative for tremors and headaches.  Psychiatric/Behavioral:  Negative for behavioral problems and sleep disturbance. The  patient is not nervous/anxious.     Immunization History  Administered Date(s) Administered   Dtap, Unspecified 03/12/2018   Fluad Quad(high Dose 65+) 04/24/2022   Fluad Trivalent(High Dose 65+) 04/27/2023   Fluzone Influenza virus  vaccine,trivalent (IIV3), split virus 09/22/2009, 03/29/2010, 07/23/2011, 04/03/2012, 03/30/2014   INFLUENZA, HIGH DOSE SEASONAL PF 04/15/2013, 03/14/2018, 03/17/2024   Influenza Inj Mdck Quad Pf 04/12/2017   Influenza, Mdck, Trivalent,PF 6+ MOS(egg free) 03/16/2016, 04/12/2017   Influenza, Quadrivalent, Recombinant, Inj, Pf 03/05/2019, 05/22/2021   Influenza,inj,Quad PF,6+ Mos 03/30/2014, 03/25/2015   PFIZER(Purple Top)SARS-COV-2 Vaccination 07/25/2019, 08/16/2019, 03/31/2020, 03/31/2021, 12/12/2021   Pneumococcal Conjugate-13 10/19/2013, 11/01/2014   Pneumococcal Polysaccharide-23 09/22/2009, 05/01/2010, 07/23/2011   Pneumococcal-Unspecified 03/02/2011   Td (Adult),5 Lf Tetanus Toxid, Preservative Free 07/23/2011   Tdap 09/22/2009, 07/23/2011, 03/12/2018   Zoster Recombinant(Shingrix) 10/08/2017, 12/09/2017   Zoster, Live 09/22/2009, 07/23/2011, 12/16/2017   Pertinent  Health Maintenance Due  Topic Date Due   DEXA SCAN  Never done   Influenza Vaccine  Completed      04/04/2023   11:00 AM 07/10/2023    9:02 AM 10/24/2023    2:27 PM 03/04/2024    6:22 PM 03/12/2024   11:29 AM  Fall Risk  Falls in the past year? 0 0 0 0 0  Was there an injury with Fall? 0 0 0 0 0  Fall Risk Category Calculator 0 0 0 0 0  Patient at Risk for Falls Due to History of fall(s);Impaired balance/gait;Impaired mobility History of fall(s);Impaired balance/gait;Impaired mobility History of fall(s);Impaired balance/gait;Impaired vision History of fall(s) History of fall(s)  Fall risk Follow up Falls evaluation completed;Education provided;Falls prevention discussed Falls evaluation completed;Education provided;Falls prevention discussed Falls evaluation completed;Education provided Falls evaluation completed Falls evaluation completed   Functional Status Survey:    Vitals:   03/19/24 1208  BP: (!) 122/56  Pulse: 78  Resp: 18  Temp: (!) 96.9 F (36.1 C)  SpO2: 94%  Weight: 157 lb 1.6 oz (71.3 kg)   Body  mass index is 29.68 kg/m. Physical Exam Nursing note reviewed.  Constitutional:      Comments: Fatigue  HENT:     Head: Normocephalic and atraumatic.     Nose: Nose normal.  Eyes:     Extraocular Movements: Extraocular movements intact.     Conjunctiva/sclera: Conjunctivae normal.     Pupils: Pupils are equal, round, and reactive to light.  Cardiovascular:     Rate and Rhythm: Normal rate and regular rhythm.     Heart sounds: No murmur heard. Pulmonary:     Effort: Pulmonary effort is normal.     Breath sounds: Rales present.     Comments: Bibasilar rales Abdominal:     General: Bowel sounds are normal.     Palpations: Abdomen is soft.     Tenderness: There is no abdominal tenderness.  Musculoskeletal:        General: Tenderness present. No swelling, deformity or signs of injury. Normal range of motion.     Cervical back: Normal range of motion and neck supple.     Right lower leg: Edema present.     Left lower leg: Edema present.     Comments: Trace edema BLE Chronic R shoulder/arm pain, full ROM, hx of rotator cuff injury Left groin to thigh to ankle pain with therapy, straight leg raise, able to weight bearing and walking-improved.   Skin:    General: Skin is warm and dry.  Neurological:     General: No focal deficit present.  Mental Status: She is alert and oriented to person, place, and time. Mental status is at baseline.     Gait: Gait abnormal.  Psychiatric:        Mood and Affect: Mood normal.        Behavior: Behavior normal.        Thought Content: Thought content normal.        Judgment: Judgment normal.     Labs reviewed: Recent Labs    11/19/23 0904 11/20/23 0254 11/21/23 0310 12/29/23 0247 12/30/23 0324 12/31/23 0533 03/14/24 0313 03/15/24 0301 03/18/24 0452  NA  --  141   < > 139 139   < > 138 142 143  K  --  3.8   < > 3.6 3.4*   < > 3.9 3.6 3.6  CL  --  112*   < > 109 109   < > 107 110 108  CO2  --  23   < > 23 23   < > 22 24 26    GLUCOSE  --  130*   < > 99 122*   < > 138* 101* 105*  BUN  --  13   < > 9 7*   < > 13 12 7*  CREATININE  --  <0.30*   < > 0.36* 0.40*   < > 0.44 0.31* 0.31*  CALCIUM   --  7.9*   < > 8.2* 8.1*   < > 7.7* 7.8* 8.3*  MG  --  2.0   < > 2.5* 2.3  --  2.4  --   --   PHOS 2.2* 1.8*  --   --   --   --  3.0  --   --    < > = values in this interval not displayed.   Recent Labs    12/28/23 0411 03/13/24 2052 03/18/24 0452  AST 20 23 19   ALT 9 8 6   ALKPHOS 49 51 41  BILITOT 1.9* 0.3 0.3  PROT 4.9* 6.4* 5.2*  ALBUMIN 2.9* 3.8 3.2*   Recent Labs    03/13/24 2052 03/14/24 0313 03/17/24 1331 03/17/24 1707 03/18/24 0452  WBC 5.4   < > 3.7* 3.6* 3.7*  NEUTROABS 3.8  --   --  2.2 2.0  HGB 10.3*   < > 7.6* 7.6* 7.6*  HCT 33.3*   < > 25.1* 24.8* 24.2*  MCV 98.5   < > 100.8* 99.6 99.6  PLT 143*   < > 128* 136* 146*   < > = values in this interval not displayed.   Lab Results  Component Value Date   TSH 2.47 10/08/2022   No results found for: HGBA1C Lab Results  Component Value Date   CHOL 125 07/14/2023   HDL 51 07/14/2023   LDLCALC 53 07/14/2023   TRIG 130 07/14/2023    Significant Diagnostic Results in last 30 days:  IR US  Guide Vasc Access Right Result Date: 03/15/2024 CLINICAL DATA:  GI bleed. Active extravasation near the splenic flexure on recent CTA. Previous coil embolization x2 EXAM: IR ULTRASOUND GUIDANCE VASC ACCESS RIGHT; SELECTIVE VISCERAL ARTERIOGRAPHY; ADDITIONAL ARTERIOGRAPHY ANESTHESIA/SEDATION: Intravenous Fentanyl  50mcg and Versed  2mg  were administered by RN during a total moderate (conscious) sedation time of 35 minutes; the patient's level of consciousness and physiological / cardiorespiratory status were monitored continuously by radiology RN under my direct supervision. MEDICATIONS: Lidocaine  1% subcutaneous CONTRAST:  70mL OMNIPAQUE  IOHEXOL  300 MG/ML  SOLN PROCEDURE: The procedure, risks (including but not limited to bleeding,  infection, organ damage ),  benefits, and alternatives were explained to the patient. Questions regarding the procedure were encouraged and answered. The patient understands and consents to the procedure. Right femoral region prepped and draped in usual sterile fashion. Maximal barrier sterile technique was utilized including caps, mask, sterile gowns, sterile gloves, sterile drape, hand hygiene and skin antiseptic. The right common femoral artery was localized under ultrasound. Under real-time ultrasound guidance, the vessel was accessed with a 21-gauge micropuncture needle, exchanged over a 018 guidewire for a transitional dilator, through which a 035 guidewire was advanced. Over this, a 5 [place] vascular sheath was placed, through which a 5 [place] RIM catheter was advanced and used to selectively catheterize the inferior mesenteric artery for selective arteriography in multiple projections. A coaxial Renegade microcatheter was advanced into two third-order branches of the IMA for selective arteriography. The microcatheter was removed. The RIM was exchanged for C2, utilized to selectively catheterize the superior mesenteric artery for selective arteriography in multiple projections. The catheter and sheath were removed and hemostasis achieved with the aid of the Celt device under ultrasound and fluoroscopic guidance. The patient tolerated the procedure well. COMPLICATIONS: None immediate FINDINGS: 2 sets of embolization coils from previous interventions noted in the IMA distribution at the level of the splenic flexure and proximal descending colon. No evidence of active extravasation, early draining vein, AVM, or other lesion to suggest a site or etiology of the patient's GI bleeding. IMPRESSION: 1. Negative mesenteric arteriogram. No evidence of active extravasation or other focal lesion to suggest etiology of GI bleed. Electronically Signed   By: JONETTA Faes M.D.   On: 03/15/2024 14:46   IR Angiogram Visceral Selective Result Date:  03/15/2024 CLINICAL DATA:  GI bleed. Active extravasation near the splenic flexure on recent CTA. Previous coil embolization x2 EXAM: IR ULTRASOUND GUIDANCE VASC ACCESS RIGHT; SELECTIVE VISCERAL ARTERIOGRAPHY; ADDITIONAL ARTERIOGRAPHY ANESTHESIA/SEDATION: Intravenous Fentanyl  50mcg and Versed  2mg  were administered by RN during a total moderate (conscious) sedation time of 35 minutes; the patient's level of consciousness and physiological / cardiorespiratory status were monitored continuously by radiology RN under my direct supervision. MEDICATIONS: Lidocaine  1% subcutaneous CONTRAST:  70mL OMNIPAQUE  IOHEXOL  300 MG/ML  SOLN PROCEDURE: The procedure, risks (including but not limited to bleeding, infection, organ damage ), benefits, and alternatives were explained to the patient. Questions regarding the procedure were encouraged and answered. The patient understands and consents to the procedure. Right femoral region prepped and draped in usual sterile fashion. Maximal barrier sterile technique was utilized including caps, mask, sterile gowns, sterile gloves, sterile drape, hand hygiene and skin antiseptic. The right common femoral artery was localized under ultrasound. Under real-time ultrasound guidance, the vessel was accessed with a 21-gauge micropuncture needle, exchanged over a 018 guidewire for a transitional dilator, through which a 035 guidewire was advanced. Over this, a 5 [place] vascular sheath was placed, through which a 5 [place] RIM catheter was advanced and used to selectively catheterize the inferior mesenteric artery for selective arteriography in multiple projections. A coaxial Renegade microcatheter was advanced into two third-order branches of the IMA for selective arteriography. The microcatheter was removed. The RIM was exchanged for C2, utilized to selectively catheterize the superior mesenteric artery for selective arteriography in multiple projections. The catheter and sheath were removed and  hemostasis achieved with the aid of the Celt device under ultrasound and fluoroscopic guidance. The patient tolerated the procedure well. COMPLICATIONS: None immediate FINDINGS: 2 sets of embolization coils from previous interventions noted in the IMA distribution  at the level of the splenic flexure and proximal descending colon. No evidence of active extravasation, early draining vein, AVM, or other lesion to suggest a site or etiology of the patient's GI bleeding. IMPRESSION: 1. Negative mesenteric arteriogram. No evidence of active extravasation or other focal lesion to suggest etiology of GI bleed. Electronically Signed   By: JONETTA Faes M.D.   On: 03/15/2024 14:46   IR Angiogram Selective Each Additional Vessel Result Date: 03/15/2024 CLINICAL DATA:  GI bleed. Active extravasation near the splenic flexure on recent CTA. Previous coil embolization x2 EXAM: IR ULTRASOUND GUIDANCE VASC ACCESS RIGHT; SELECTIVE VISCERAL ARTERIOGRAPHY; ADDITIONAL ARTERIOGRAPHY ANESTHESIA/SEDATION: Intravenous Fentanyl  50mcg and Versed  2mg  were administered by RN during a total moderate (conscious) sedation time of 35 minutes; the patient's level of consciousness and physiological / cardiorespiratory status were monitored continuously by radiology RN under my direct supervision. MEDICATIONS: Lidocaine  1% subcutaneous CONTRAST:  70mL OMNIPAQUE  IOHEXOL  300 MG/ML  SOLN PROCEDURE: The procedure, risks (including but not limited to bleeding, infection, organ damage ), benefits, and alternatives were explained to the patient. Questions regarding the procedure were encouraged and answered. The patient understands and consents to the procedure. Right femoral region prepped and draped in usual sterile fashion. Maximal barrier sterile technique was utilized including caps, mask, sterile gowns, sterile gloves, sterile drape, hand hygiene and skin antiseptic. The right common femoral artery was localized under ultrasound. Under real-time  ultrasound guidance, the vessel was accessed with a 21-gauge micropuncture needle, exchanged over a 018 guidewire for a transitional dilator, through which a 035 guidewire was advanced. Over this, a 5 [place] vascular sheath was placed, through which a 5 [place] RIM catheter was advanced and used to selectively catheterize the inferior mesenteric artery for selective arteriography in multiple projections. A coaxial Renegade microcatheter was advanced into two third-order branches of the IMA for selective arteriography. The microcatheter was removed. The RIM was exchanged for C2, utilized to selectively catheterize the superior mesenteric artery for selective arteriography in multiple projections. The catheter and sheath were removed and hemostasis achieved with the aid of the Celt device under ultrasound and fluoroscopic guidance. The patient tolerated the procedure well. COMPLICATIONS: None immediate FINDINGS: 2 sets of embolization coils from previous interventions noted in the IMA distribution at the level of the splenic flexure and proximal descending colon. No evidence of active extravasation, early draining vein, AVM, or other lesion to suggest a site or etiology of the patient's GI bleeding. IMPRESSION: 1. Negative mesenteric arteriogram. No evidence of active extravasation or other focal lesion to suggest etiology of GI bleed. Electronically Signed   By: JONETTA Faes M.D.   On: 03/15/2024 14:46   IR Angiogram Selective Each Additional Vessel Result Date: 03/15/2024 CLINICAL DATA:  GI bleed. Active extravasation near the splenic flexure on recent CTA. Previous coil embolization x2 EXAM: IR ULTRASOUND GUIDANCE VASC ACCESS RIGHT; SELECTIVE VISCERAL ARTERIOGRAPHY; ADDITIONAL ARTERIOGRAPHY ANESTHESIA/SEDATION: Intravenous Fentanyl  50mcg and Versed  2mg  were administered by RN during a total moderate (conscious) sedation time of 35 minutes; the patient's level of consciousness and physiological / cardiorespiratory  status were monitored continuously by radiology RN under my direct supervision. MEDICATIONS: Lidocaine  1% subcutaneous CONTRAST:  70mL OMNIPAQUE  IOHEXOL  300 MG/ML  SOLN PROCEDURE: The procedure, risks (including but not limited to bleeding, infection, organ damage ), benefits, and alternatives were explained to the patient. Questions regarding the procedure were encouraged and answered. The patient understands and consents to the procedure. Right femoral region prepped and draped in usual sterile fashion. Maximal  barrier sterile technique was utilized including caps, mask, sterile gowns, sterile gloves, sterile drape, hand hygiene and skin antiseptic. The right common femoral artery was localized under ultrasound. Under real-time ultrasound guidance, the vessel was accessed with a 21-gauge micropuncture needle, exchanged over a 018 guidewire for a transitional dilator, through which a 035 guidewire was advanced. Over this, a 5 [place] vascular sheath was placed, through which a 5 [place] RIM catheter was advanced and used to selectively catheterize the inferior mesenteric artery for selective arteriography in multiple projections. A coaxial Renegade microcatheter was advanced into two third-order branches of the IMA for selective arteriography. The microcatheter was removed. The RIM was exchanged for C2, utilized to selectively catheterize the superior mesenteric artery for selective arteriography in multiple projections. The catheter and sheath were removed and hemostasis achieved with the aid of the Celt device under ultrasound and fluoroscopic guidance. The patient tolerated the procedure well. COMPLICATIONS: None immediate FINDINGS: 2 sets of embolization coils from previous interventions noted in the IMA distribution at the level of the splenic flexure and proximal descending colon. No evidence of active extravasation, early draining vein, AVM, or other lesion to suggest a site or etiology of the patient's GI  bleeding. IMPRESSION: 1. Negative mesenteric arteriogram. No evidence of active extravasation or other focal lesion to suggest etiology of GI bleed. Electronically Signed   By: JONETTA Faes M.D.   On: 03/15/2024 14:46   IR Angiogram Selective Each Additional Vessel Result Date: 03/15/2024 CLINICAL DATA:  GI bleed. Active extravasation near the splenic flexure on recent CTA. Previous coil embolization x2 EXAM: IR ULTRASOUND GUIDANCE VASC ACCESS RIGHT; SELECTIVE VISCERAL ARTERIOGRAPHY; ADDITIONAL ARTERIOGRAPHY ANESTHESIA/SEDATION: Intravenous Fentanyl  50mcg and Versed  2mg  were administered by RN during a total moderate (conscious) sedation time of 35 minutes; the patient's level of consciousness and physiological / cardiorespiratory status were monitored continuously by radiology RN under my direct supervision. MEDICATIONS: Lidocaine  1% subcutaneous CONTRAST:  70mL OMNIPAQUE  IOHEXOL  300 MG/ML  SOLN PROCEDURE: The procedure, risks (including but not limited to bleeding, infection, organ damage ), benefits, and alternatives were explained to the patient. Questions regarding the procedure were encouraged and answered. The patient understands and consents to the procedure. Right femoral region prepped and draped in usual sterile fashion. Maximal barrier sterile technique was utilized including caps, mask, sterile gowns, sterile gloves, sterile drape, hand hygiene and skin antiseptic. The right common femoral artery was localized under ultrasound. Under real-time ultrasound guidance, the vessel was accessed with a 21-gauge micropuncture needle, exchanged over a 018 guidewire for a transitional dilator, through which a 035 guidewire was advanced. Over this, a 5 [place] vascular sheath was placed, through which a 5 [place] RIM catheter was advanced and used to selectively catheterize the inferior mesenteric artery for selective arteriography in multiple projections. A coaxial Renegade microcatheter was advanced into two  third-order branches of the IMA for selective arteriography. The microcatheter was removed. The RIM was exchanged for C2, utilized to selectively catheterize the superior mesenteric artery for selective arteriography in multiple projections. The catheter and sheath were removed and hemostasis achieved with the aid of the Celt device under ultrasound and fluoroscopic guidance. The patient tolerated the procedure well. COMPLICATIONS: None immediate FINDINGS: 2 sets of embolization coils from previous interventions noted in the IMA distribution at the level of the splenic flexure and proximal descending colon. No evidence of active extravasation, early draining vein, AVM, or other lesion to suggest a site or etiology of the patient's GI bleeding. IMPRESSION: 1. Negative mesenteric  arteriogram. No evidence of active extravasation or other focal lesion to suggest etiology of GI bleed. Electronically Signed   By: JONETTA Faes M.D.   On: 03/15/2024 14:46   IR Angiogram Visceral Selective Result Date: 03/15/2024 CLINICAL DATA:  GI bleed. Active extravasation near the splenic flexure on recent CTA. Previous coil embolization x2 EXAM: IR ULTRASOUND GUIDANCE VASC ACCESS RIGHT; SELECTIVE VISCERAL ARTERIOGRAPHY; ADDITIONAL ARTERIOGRAPHY ANESTHESIA/SEDATION: Intravenous Fentanyl  50mcg and Versed  2mg  were administered by RN during a total moderate (conscious) sedation time of 35 minutes; the patient's level of consciousness and physiological / cardiorespiratory status were monitored continuously by radiology RN under my direct supervision. MEDICATIONS: Lidocaine  1% subcutaneous CONTRAST:  70mL OMNIPAQUE  IOHEXOL  300 MG/ML  SOLN PROCEDURE: The procedure, risks (including but not limited to bleeding, infection, organ damage ), benefits, and alternatives were explained to the patient. Questions regarding the procedure were encouraged and answered. The patient understands and consents to the procedure. Right femoral region prepped and  draped in usual sterile fashion. Maximal barrier sterile technique was utilized including caps, mask, sterile gowns, sterile gloves, sterile drape, hand hygiene and skin antiseptic. The right common femoral artery was localized under ultrasound. Under real-time ultrasound guidance, the vessel was accessed with a 21-gauge micropuncture needle, exchanged over a 018 guidewire for a transitional dilator, through which a 035 guidewire was advanced. Over this, a 5 [place] vascular sheath was placed, through which a 5 [place] RIM catheter was advanced and used to selectively catheterize the inferior mesenteric artery for selective arteriography in multiple projections. A coaxial Renegade microcatheter was advanced into two third-order branches of the IMA for selective arteriography. The microcatheter was removed. The RIM was exchanged for C2, utilized to selectively catheterize the superior mesenteric artery for selective arteriography in multiple projections. The catheter and sheath were removed and hemostasis achieved with the aid of the Celt device under ultrasound and fluoroscopic guidance. The patient tolerated the procedure well. COMPLICATIONS: None immediate FINDINGS: 2 sets of embolization coils from previous interventions noted in the IMA distribution at the level of the splenic flexure and proximal descending colon. No evidence of active extravasation, early draining vein, AVM, or other lesion to suggest a site or etiology of the patient's GI bleeding. IMPRESSION: 1. Negative mesenteric arteriogram. No evidence of active extravasation or other focal lesion to suggest etiology of GI bleed. Electronically Signed   By: JONETTA Faes M.D.   On: 03/15/2024 14:46   CT Angio Abd/Pel W and/or Wo Contrast Result Date: 03/13/2024 CLINICAL DATA:  Lower gastrointestinal bleeding EXAM: CTA ABDOMEN AND PELVIS WITHOUT AND WITH CONTRAST TECHNIQUE: Multidetector CT imaging of the abdomen and pelvis was performed using the standard  protocol during bolus administration of intravenous contrast. Multiplanar reconstructed images and MIPs were obtained and reviewed to evaluate the vascular anatomy. RADIATION DOSE REDUCTION: This exam was performed according to the departmental dose-optimization program which includes automated exposure control, adjustment of the mA and/or kV according to patient size and/or use of iterative reconstruction technique. CONTRAST:  OMNIPAQUE  IOHEXOL  350 MG/ML SOLN COMPARISON:  08/17/2018, 12/27/2023 FINDINGS: VASCULAR Aorta: Normal caliber aorta without aneurysm, dissection, vasculitis or significant stenosis. Stable atherosclerosis. Celiac: Patent without evidence of aneurysm, dissection, vasculitis or significant stenosis. SMA: Patent without evidence of aneurysm, dissection, vasculitis or significant stenosis. Renals: Both renal arteries are patent without evidence of aneurysm, dissection, vasculitis, or fibromuscular dysplasia. Stable mild stenosis of both right renal artery, with wide patency of the left renal artery. IMA: Patent without evidence of aneurysm, dissection, vasculitis  or significant stenosis. Embolic coils are seen within branches of the IMA near the splenic flexure of the colon and proximal descending colon. Inflow: Patent without evidence of aneurysm, dissection, vasculitis or significant stenosis. Proximal Outflow: Bilateral common femoral and visualized portions of the superficial and profunda femoral arteries are patent without evidence of aneurysm, dissection, vasculitis or significant stenosis. Veins: No obvious venous abnormality within the limitations of this arterial phase study. Review of the MIP images confirms the above findings. NON-VASCULAR Lower chest: No acute pleural or parenchymal lung disease. Hepatobiliary: Cholelithiasis without cholecystitis. Mild hepatic steatosis. No focal liver abnormality. Pancreas: Unremarkable. No pancreatic ductal dilatation or surrounding  inflammatory changes. Spleen: Normal in size without focal abnormality. Adrenals/Urinary Tract: Stable left extrarenal pelvis. The kidneys enhance normally. The adrenals and bladder are unremarkable. Stomach/Bowel: There is intraluminal accumulation of contrast within the distal transverse colon and splenic flexure of the colon, consistent with acute gastrointestinal hemorrhage. Hemorrhage is likely due to extensive diverticulosis in this region. This is slightly proximal to the location of the hemorrhage seen on prior CT exam. No bowel obstruction or ileus. Diffuse colonic diverticulosis with no evidence of acute diverticulitis. Normal appendix right lower quadrant. Lymphatic: No pathologic adenopathy. Reproductive: Uterus and bilateral adnexa are unremarkable. Other: No free fluid or free intraperitoneal gas. No abdominal wall hernia. Musculoskeletal: No acute or destructive bony abnormalities. Chronic L1 compression deformity again noted. Reconstructed images demonstrate no additional findings. IMPRESSION: VASCULAR 1. Active gastrointestinal hemorrhage within the distal transverse colon and splenic flexure of the colon, likely due to diverticulosis. 2.  Aortic Atherosclerosis (ICD10-I70.0). 3. 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 NON-VASCULAR 1. Cholelithiasis without cholecystitis. 2. Diverticulosis without diverticulitis. Critical Value/emergent results were called by telephone at the time of interpretation on 03/13/2024 at 10:01 pm to provider [facility] , who verbally acknowledged these results. Electronically Signed   By: Ozell Daring M.D.   On: 03/13/2024 22:03    Assessment/Plan: Acute on chronic anemia Hospitalized 03/13/2024 to 03/18/2024 for acute on chronic anemia, lower GI bleed, thrombocytopenia, needs repeat CBC differential and CMP/eGFR, Hgb 7.6 03/18/2024(transfused due to he Hgb 6.9 03/14/2024).  Underwent mesenteric  angiogram to attempt another coiling but no active bleeding was identified Updated CBC/differential, CMP/eGFR 03/23/2024 Continue iron  and vitamin B-12 supplement May consider hematology evaluation as outpatient if desired  GERD (gastroesophageal reflux disease) takes Pantoprazole  bid. 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

## 2024-03-19 NOTE — Assessment & Plan Note (Signed)
 O2 at night, takes Ellipta, Claritin , prn Albuterol 

## 2024-03-19 NOTE — Assessment & Plan Note (Signed)
 on Oxybutynin 

## 2024-03-19 NOTE — Assessment & Plan Note (Signed)
takes Requip

## 2024-03-19 NOTE — Assessment & Plan Note (Signed)
 takes Fosamax, Vit D, will delay bone density due to comorbidity and deconditioning

## 2024-03-19 NOTE — Assessment & Plan Note (Signed)
 takes Zocor 

## 2024-03-19 NOTE — Assessment & Plan Note (Signed)
 mild edema BLE, taking Torsemide , Bun/creat 7/0.31 03/18/2024

## 2024-03-23 DIAGNOSIS — F4001 Agoraphobia with panic disorder: Secondary | ICD-10-CM | POA: Diagnosis not present

## 2024-03-23 DIAGNOSIS — F330 Major depressive disorder, recurrent, mild: Secondary | ICD-10-CM | POA: Diagnosis not present

## 2024-03-23 LAB — CBC AND DIFFERENTIAL
HCT: 27 — AB (ref 36–46)
Hemoglobin: 8.3 — AB (ref 12.0–16.0)
Neutrophils Absolute: 3764
Platelets: 259 K/uL (ref 150–400)
WBC: 6.1

## 2024-03-23 LAB — CBC: RBC: 2.78 — AB (ref 3.87–5.11)

## 2024-03-23 LAB — HEPATIC FUNCTION PANEL
ALT: 9 U/L (ref 7–35)
AST: 16 (ref 13–35)
Alkaline Phosphatase: 49 (ref 25–125)
Bilirubin, Total: 0.4

## 2024-03-23 LAB — COMPREHENSIVE METABOLIC PANEL WITH GFR
Albumin: 3.6 (ref 3.5–5.0)
Calcium: 8.8 (ref 8.7–10.7)
Globulin: 2.2
eGFR: 90

## 2024-03-23 LAB — BASIC METABOLIC PANEL WITH GFR
BUN: 12 (ref 4–21)
CO2: 33 — AB (ref 13–22)
Chloride: 97 — AB (ref 99–108)
Creatinine: 0.6 (ref 0.5–1.1)
Glucose: 111
Potassium: 3.3 meq/L — AB (ref 3.5–5.1)
Sodium: 140 (ref 137–147)

## 2024-03-31 ENCOUNTER — Encounter: Payer: Self-pay | Admitting: Nurse Practitioner

## 2024-03-31 ENCOUNTER — Non-Acute Institutional Stay (SKILLED_NURSING_FACILITY): Payer: Self-pay | Admitting: Nurse Practitioner

## 2024-03-31 DIAGNOSIS — M810 Age-related osteoporosis without current pathological fracture: Secondary | ICD-10-CM

## 2024-03-31 DIAGNOSIS — M150 Primary generalized (osteo)arthritis: Secondary | ICD-10-CM

## 2024-03-31 DIAGNOSIS — E785 Hyperlipidemia, unspecified: Secondary | ICD-10-CM | POA: Diagnosis not present

## 2024-03-31 DIAGNOSIS — K219 Gastro-esophageal reflux disease without esophagitis: Secondary | ICD-10-CM

## 2024-03-31 DIAGNOSIS — I5032 Chronic diastolic (congestive) heart failure: Secondary | ICD-10-CM

## 2024-03-31 DIAGNOSIS — I10 Essential (primary) hypertension: Secondary | ICD-10-CM

## 2024-03-31 DIAGNOSIS — G2581 Restless legs syndrome: Secondary | ICD-10-CM | POA: Diagnosis not present

## 2024-03-31 DIAGNOSIS — K922 Gastrointestinal hemorrhage, unspecified: Secondary | ICD-10-CM | POA: Diagnosis not present

## 2024-03-31 DIAGNOSIS — N3946 Mixed incontinence: Secondary | ICD-10-CM

## 2024-03-31 DIAGNOSIS — D649 Anemia, unspecified: Secondary | ICD-10-CM | POA: Diagnosis not present

## 2024-03-31 DIAGNOSIS — F339 Major depressive disorder, recurrent, unspecified: Secondary | ICD-10-CM

## 2024-03-31 DIAGNOSIS — E876 Hypokalemia: Secondary | ICD-10-CM

## 2024-03-31 DIAGNOSIS — R911 Solitary pulmonary nodule: Secondary | ICD-10-CM

## 2024-03-31 NOTE — Progress Notes (Unsigned)
 Location:  [facility] Room Number: [phone] Place of Service:  SNF (31)  Provider: Raizel Wesolowski X, NP  PCP: Gil Greig BRAVO, NP Patient Care Team: Gil Greig BRAVO, NP as PCP - General (Adult Health Nurse Practitioner) Charlanne Fredia CROME, MD as Consulting Physician (Internal Medicine) Apickup-Ot, A, OT as Occupational Therapist (Occupational Therapy) Pccm, Md, MD (Pulmonary Disease)  Extended Emergency Contact Information Primary Emergency Contact: Brum,[name] Address: [address]          [place], [place] [address] [place] Home Phone: [phone] Mobile Phone: [phone] Relation: Spouse Secondary Emergency Contact: Hineman MD,[name] Ozell Crane Phone: [phone] Relation: Son Interpreter needed? No  Code Status: DNR Goals of care:  Advanced Directive information    03/13/2024    8:28 PM  Advanced Directives  Does Patient Have a Medical Advance Directive? No  Does patient want to make changes to medical advance directive? No - Patient declined  Would patient like information on creating a medical advance directive? No - Patient declined     Allergies  Allergen Reactions   Morphine And Codeine Itching   Aspirin Other (See Comments)    Unknown reaction   Duloxetine  Hcl Other (See Comments)    Unknown reaction   Tymlos [Abaloparatide] Other (See Comments)    Unknown reaction   Ventolin  [Albuterol ] Other (See Comments)    rapid heart beat   Cefadroxil Hives    Patient can take amoxicillin  and cipro     Chief Complaint  Patient presents with   Discharge Note    DC [place] [facility]    HPI:  83 y.o. female with medical history significant for recurrent GI bleed, GERD, anemia, thrombocytopaenia, COPD, pulmonary nodule, HTN, HLD, anxiety/depression, OP, OA, RLS, urinary incontinence, and CHF was admitted to [facility] for therapy following hospital stay.     Hospitalized 03/13/2024 to 03/18/2024 for acute on chronic anemia, lower GI bleed,  thrombocytopenia, needs repeat CBC differential and CMP/eGFR, Hgb 7.6 03/18/2024(transfused due to he Hgb 6.9 03/14/2024).  Underwent mesenteric angiogram to attempt another coiling but no active bleeding was identified             Hospitalized 12/27/2023 to 01/01/2024 for lower GI bleeding, CTA with actively bleeding in the descending colon C/W acute diverticular bleeding.  GI was consulted and IR performed embolization.  Status post 2 units PRBC 2/2 Hgb 6.4 Hospitalized 11/18/23-11/26/23 for lower GI bleed, hx of diverticulosis with bleeding, Hx of multiple GI bleed, 09/2023 Colonoscopy/ EGD showed gastritis and ischemic colitis,  transfused due to Hgb 5.3.   The patient has no further GI bleed since admitted to SN[facility], she has regained physical strength, mobility, and ADL function, she is stable to return to [place] [facility].              GERD, takes Pantoprazole  bid. Followed by GI             Anemia, Hx of GI bleed, transfused,  takes Iron , Hgb 7.6 03/18/2024>> 8.3 03/23/24, hematology consulted to follow pancytopenia, no further workup recommended             COPD O2 at night, takes Ellipta, Claritin , prn Albuterol              Pulmonary nodule pretracheal lymph node 1.3x1.2 cm CT chest 08/09/21, failed follow-up chest CT due to deconditioning             HTN takes Torsemide   HLD takes Zocor              Anxiety/depression, takes Sertraline              OP takes Fosamax, Vit D             OA multiple sites, chronic right shoulder/R arm pain(rotator cuff injury), takes Tylenol              RLS, takes Requip              Urinary incontinence, on Oxybutynin              CHF mild edema BLE, taking Torsemide , Bun/creat 12/0.6 03/23/24  Past Medical History:  Diagnosis Date   Anxiety    Arthritis    bilateral knees, shoulders, elbows; neck, pretty widespread (09/05/2017)   BPPV (benign paroxysmal positional vertigo)    Depression    GERD (gastroesophageal reflux disease)    Glaucoma, both eyes    Headache     probably 2/month (09/05/2017)   History of blood transfusion ~ 2008   related to LGIB   Hyperlipemia    Lower GI bleeding ~ 2008; 09/05/2017   had to have blood transfusion   Macular degeneration, bilateral    Osteopenia    Seasonal allergies    Skin cancer, 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 ENDOSCOPY;  Service: Gastroenterology;;   BIOPSY  04/25/2023   Procedure: BIOPSY;  Surgeon: Dianna Specking, MD;  Location: [facility] ENDOSCOPY;  Service: Gastroenterology;;   BLEPHAROPLASTY Bilateral    BONE BIOPSY  10/26/2023   Procedure: BIOPSY, GI;  Surgeon: Elicia Claw, MD;  Location: [facility] ENDOSCOPY;  Service: Gastroenterology;;   CATARACT EXTRACTION W/ INTRAOCULAR LENS  IMPLANT, BILATERAL Bilateral 1990's   COLONOSCOPY N/A 10/26/2023   Procedure: COLONOSCOPY;  Surgeon: Elicia Claw, MD;  Location: [facility] ENDOSCOPY;  Service: Gastroenterology;  Laterality: N/A;   COLONOSCOPY WITH PROPOFOL  N/A 05/04/2022   Procedure: COLONOSCOPY WITH PROPOFOL ;  Surgeon: Saintclair Jasper, MD;  Location: [facility] ENDOSCOPY;  Service: Gastroenterology;  Laterality: N/A;   COLONOSCOPY WITH PROPOFOL  N/A 04/25/2023   Procedure: COLONOSCOPY WITH PROPOFOL ;  Surgeon: Dianna Specking, MD;  Location: [facility] ENDOSCOPY;  Service: Gastroenterology;  Laterality: N/A;   ENTEROSCOPY N/A 11/20/2023   Procedure: ENTEROSCOPY;  Surgeon: Dianna Specking, MD;  Location: [facility] ENDOSCOPY;  Service: Gastroenterology;  Laterality: N/A;   ESOPHAGOGASTRODUODENOSCOPY N/A 10/26/2023   Procedure: EGD (ESOPHAGOGASTRODUODENOSCOPY);  Surgeon:  Elicia Claw, MD;  Location: [facility]COPY;  Service: Gastroenterology;  Laterality: N/A;   ESOPHAGOGASTRODUODENOSCOPY N/A 11/20/2023   Procedure: EGD (ESOPHAGOGASTRODUODENOSCOPY);  Surgeon: Dianna Specking, MD;  Location: [facility]OSCOPY;  Service: Gastroenterology;  Laterality: N/A;   ESOPHAGOGASTRODUODENOSCOPY (EGD) WITH PROPOFOL  N/A 05/08/2021   Procedure: ESOPHAGOGASTRODUODENOSCOPY (EGD) WITH PROPOFOL ;  Surgeon: Saintclair Jasper, MD;  Location: [facility] ENDOSCOPY;  Service: Gastroenterology;  Laterality: N/A;   ESOPHAGOGASTRODUODENOSCOPY (EGD) WITH PROPOFOL  N/A 01/11/2022   Procedure: ESOPHAGOGASTRODUODENOSCOPY (EGD) WITH PROPOFOL ;  Surgeon: Rosalie Kitchens, MD;  Location: [facility] ENDOSCOPY;  Service: Gastroenterology;  Laterality: N/A;   ESOPHAGOGASTRODUODENOSCOPY (EGD) WITH PROPOFOL  N/A 01/06/2023  Procedure: ESOPHAGOGASTRODUODENOSCOPY (EGD) WITH PROPOFOL ;  Surgeon: Rosalie Kitchens, MD;  Location: [facility] ENDOSCOPY;  Service: Gastroenterology;  Laterality: N/A;   ESOPHAGOGASTRODUODENOSCOPY (EGD) WITH PROPOFOL  N/A 04/21/2023   Procedure: ESOPHAGOGASTRODUODENOSCOPY (EGD) WITH PROPOFOL ;  Surgeon: Dianna Specking, MD;  Location: [facility] ENDOSCOPY;  Service: Gastroenterology;  Laterality: N/A;   EYE SURGERY Bilateral    to improve vision after cataract OR   GIVENS CAPSULE STUDY N/A 04/21/2023   Procedure: GIVENS CAPSULE STUDY;  Surgeon: Dianna Specking, MD;  Location: [facility] ENDOSCOPY;  Service: Gastroenterology;  Laterality: N/A;   GIVENS CAPSULE STUDY N/A 11/18/2023   Procedure: IMAGING PROCEDURE, GI TRACT, INTRALUMINAL, VIA CAPSULE;  Surgeon: Dianna Specking, MD;  Location: [facility] ENDOSCOPY;  Service: Gastroenterology;  Laterality: N/A;   HEMOSTASIS CLIP PLACEMENT  10/26/2023   Procedure: CONTROL OF HEMORRHAGE, GI TRACT, ENDOSCOPIC, BY CLIPPING OR OVERSEWING;  Surgeon: Elicia Claw, MD;  Location: [facility] ENDOSCOPY;  Service: Gastroenterology;;   IR ANGIOGRAM FOLLOW UP STUDY  12/19/2020   IR ANGIOGRAM SELECTIVE EACH ADDITIONAL  VESSEL  12/19/2020   IR ANGIOGRAM SELECTIVE EACH ADDITIONAL VESSEL  12/19/2020   IR ANGIOGRAM SELECTIVE EACH ADDITIONAL VESSEL  12/28/2023   IR ANGIOGRAM SELECTIVE EACH ADDITIONAL VESSEL  03/14/2024   IR ANGIOGRAM SELECTIVE EACH ADDITIONAL VESSEL  03/14/2024   IR ANGIOGRAM SELECTIVE EACH ADDITIONAL VESSEL  03/14/2024   IR ANGIOGRAM VISCERAL SELECTIVE  12/19/2020   IR ANGIOGRAM VISCERAL SELECTIVE  12/28/2023   IR ANGIOGRAM VISCERAL SELECTIVE  03/14/2024   IR ANGIOGRAM VISCERAL SELECTIVE  03/14/2024   IR EMBO ART  VEN HEMORR LYMPH EXTRAV  INC GUIDE ROADMAPPING  12/19/2020   IR EMBO ART  VEN HEMORR LYMPH EXTRAV  INC GUIDE ROADMAPPING  12/28/2023   IR US  GUIDE VASC ACCESS RIGHT  12/19/2020   IR US  GUIDE VASC ACCESS RIGHT  12/28/2023   IR US  GUIDE VASC ACCESS RIGHT  03/14/2024   JOINT REPLACEMENT     POLYPECTOMY  05/04/2022   Procedure: POLYPECTOMY;  Surgeon: Saintclair Jasper, MD;  Location: [facility] ENDOSCOPY;  Service: Gastroenterology;;   POLYPECTOMY  10/26/2023   Procedure: POLYPECTOMY, INTESTINE;  Surgeon: Elicia Claw, MD;  Location: [facility] ENDOSCOPY;  Service: Gastroenterology;;   STAPEDES SURGERY Left    scraped stapedes because it was sticking when it wasn't suppose to   TONSILLECTOMY AND ADENOIDECTOMY  1946   TOTAL KNEE ARTHROPLASTY Left ~ 2008   TOTAL KNEE ARTHROPLASTY  12/20/2011   Procedure: TOTAL KNEE ARTHROPLASTY;  Surgeon: Elspeth JONELLE Her, MD;  Location: [facility] OR;  Service: Orthopedics;  Laterality: Right;  Right Total Knee Arthroplasty   TUBAL LIGATION  1980's      reports that she quit smoking about 31 years ago. Her smoking use included cigarettes. She started smoking about 68 years ago. She has a 27.8 pack-year smoking history. She has never been exposed to tobacco smoke. She has never used smokeless tobacco. She reports current alcohol  use of about 7.0 standard drinks of alcohol  per week. She reports that she does not use drugs. Social History   Socioeconomic History   Marital status: Married     Spouse name: Signe    Number of children: 3   Years of education: College   Highest education level: Not on file  Occupational History   Occupation: Retired Runner, broadcasting/film/video  Tobacco Use   Smoking status: Former    Current packs/day: 0.00    Average packs/day: 0.8 packs/day for 37.0 years (27.8 ttl pk-yrs)    Types: Cigarettes    Start date: 07/02/1955  Quit date: 07/01/1992    Years since quitting: 31.7    Passive exposure: Never   Smokeless tobacco: Never  Vaping Use   Vaping status: Never Used  Substance and Sexual Activity   Alcohol  use: Yes    Alcohol /week: 7.0 standard drinks of alcohol     Types: 7 Glasses of wine per week    Comment: 09/05/2017  6 oz wine, 5 days/wk   Drug use: No    Types: Hydrocodone    Sexual activity: Not Currently  Other Topics Concern   Not on file  Social History Narrative   1-2 cups of coffee a day    Social Drivers of Health   Financial Resource Strain: Low Risk  (04/04/2023)   Overall Financial Resource Strain (CARDIA)    Difficulty of Paying Living Expenses: Not hard at all  Food Insecurity: No Food Insecurity (03/14/2024)   Hunger Vital Sign    Worried About Running Out of Food in the Last Year: Never true    Ran Out of Food in the Last Year: Never true  Transportation Needs: No Transportation Needs (03/14/2024)   PRAPARE - Administrator, Civil Service (Medical): No    Lack of Transportation (Non-Medical): No  Physical Activity: Insufficiently Active (04/04/2023)   Exercise Vital Sign    Days of Exercise per Week: 2 days    Minutes of Exercise per Session: 20 min  Stress: No Stress Concern Present (04/04/2023)   [employer] of Occupational Health - Occupational Stress Questionnaire    Feeling of Stress : Only a little  Social Connections: Socially Integrated (03/14/2024)   Social Connection and Isolation Panel    Frequency of Communication with Friends and Family: More than three times a week    Frequency of Social Gatherings  with Friends and Family: More than three times a week    Attends Religious Services: 1 to 4 times per year    Active Member of [employer] or Organizations: Yes    Attends Banker Meetings: 1 to 4 times per year    Marital Status: Married  Catering manager Violence: Not At Risk (03/14/2024)   Humiliation, Afraid, Rape, and Kick questionnaire    Fear of Current or Ex-Partner: No    Emotionally Abused: No    Physically Abused: No    Sexually Abused: No   Functional Status Survey:    Allergies  Allergen Reactions   Morphine And Codeine Itching   Aspirin Other (See Comments)    Unknown reaction   Duloxetine  Hcl Other (See Comments)    Unknown reaction   Tymlos [Abaloparatide] Other (See Comments)    Unknown reaction   Ventolin  [Albuterol ] Other (See Comments)    rapid heart beat   Cefadroxil Hives    Patient can take amoxicillin  and cipro     Pertinent  Health Maintenance Due  Topic Date Due   DEXA SCAN  Never done   Influenza Vaccine  Completed    Medications: Outpatient Encounter Medications as of 03/31/2024  Medication Sig   Acetaminophen  (TYLENOL ) 325 MG CAPS Take 650 mg by mouth every 6 (six) hours as needed (pain, headache or fever.).   albuterol  (VENTOLIN  HFA) 108 (90 Base) MCG/ACT inhaler Inhale 2 puffs into the lungs every 4 (four) hours as needed for wheezing or shortness of breath.   alendronate (FOSAMAX) 70 MG tablet Take 70 mg by mouth every Sunday. Take with a full glass of water on an empty stomach.   Ascorbic Acid  (VITAMIN C ) 1000  MG tablet Take 1,000 mg by mouth every morning.   cetirizine  (ZYRTEC ) 5 MG chewable tablet Chew 5 mg by mouth daily as needed for allergies.   Cholecalciferol  (VITAMIN D3) 50 MCG (2000 UT) TABS Take 2,000 Units by mouth every morning.   cyanocobalamin  (VITAMIN B12) 1000 MCG tablet Take 1,000 mcg by mouth daily.   Dorzolamide  HCl-Timolol  Mal PF 2-0.5 % SOLN Place 1 drop into both eyes in the morning and at bedtime.   ferrous  gluconate (FERGON) 240 (27 FE) MG tablet Take 240 mg by mouth every morning. Take without food   fluticasone  (FLONASE ) 50 MCG/ACT nasal spray Place 1 spray into both nostrils as needed for allergies or rhinitis.   guaifenesin  (ROBITUSSIN) 100 MG/5ML syrup Take 10 mLs by mouth every 4 (four) hours as needed for cough.   latanoprost  (XALATAN ) 0.005 % ophthalmic solution Place 1 drop into both eyes at bedtime.   loperamide  (IMODIUM  A-D) 2 MG tablet Take 4 mg by mouth as needed for diarrhea or loose stools.   Multiple Vitamins-Minerals (PRESERVISION AREDS 2) CAPS Take 1 capsule by mouth 2 (two) times daily.   Netarsudil  Dimesylate (RHOPRESSA ) 0.02 % SOLN Place 1 drop into both eyes at bedtime.   oxybutynin  (DITROPAN -XL) 10 MG 24 hr tablet Take 10 mg by mouth at bedtime.   pantoprazole  (PROTONIX ) 40 MG tablet Take 1 tablet (40 mg total) by mouth 2 (two) times daily before a meal.   Polyethylene Glycol 3350  (MIRALAX  PO) Take 8.6 g by mouth daily as needed.   potassium chloride  SA (KLOR-CON  M) 20 MEQ tablet Take 20 mEq by mouth daily.   Probiotic Product (ALIGN) 4 MG CAPS Take 4 mg by mouth at bedtime.   rOPINIRole  (REQUIP ) 0.5 MG tablet Take 1 tablet (0.5 mg total) by mouth daily as needed.   senna (SENOKOT) 8.6 MG TABS tablet Take 2 tablets by mouth at bedtime.   sertraline  (ZOLOFT ) 50 MG tablet Take 1.5 tablets (75 mg total) by mouth daily.   simvastatin  (ZOCOR ) 20 MG tablet Take 20 mg by mouth every evening. HOLD WHILE ON PAXLOVID    Sodium Fluoride  (PREVIDENT 5000 BOOSTER PLUS) 1.1 % PSTE Place 1 Application onto teeth See admin instructions. Brush on teeth with a toothbrush after evening mouth care. Spit out excess and do not rinse.   torsemide  (DEMADEX ) 20 MG tablet Take 20 mg by mouth every morning.   umeclidinium-vilanterol (ANORO ELLIPTA ) 62.5-25 MCG/ACT AEPB Inhale 1 puff into the lungs every morning.   [DISCONTINUED] zinc oxide 20 % ointment Apply 1 Application topically as needed for  irritation. (Patient not taking: Reported on 03/31/2024)   No facility-administered encounter medications on file as of 03/31/2024.    Review of Systems  Constitutional:  Positive for fatigue. Negative for activity change and fever.  HENT:  Positive for hearing loss. Negative for congestion and trouble swallowing.   Eyes:  Negative for visual disturbance.  Respiratory:  Positive for shortness of breath. Negative for cough and wheezing.   Cardiovascular:  Positive for leg swelling.  Gastrointestinal:  Negative for abdominal pain, blood in stool and constipation.  Genitourinary:  Positive for frequency. Negative for difficulty urinating, dysuria, flank pain and urgency.  Musculoskeletal:  Positive for arthralgias and gait problem.  Skin:  Negative for color change.  Neurological:  Negative for tremors and headaches.  Psychiatric/Behavioral:  Negative for behavioral problems and sleep disturbance. The patient is not nervous/anxious.     Vitals:   03/31/24 1330  BP: 132/70  Pulse:  72  Resp: 20  Temp: 97.6 F (36.4 C)  SpO2: 98%  Weight: 157 lb 6.4 oz (71.4 kg)  Height: 5' 1 (1.549 m)   Body mass index is 29.74 kg/m. Physical Exam Nursing note reviewed.  Constitutional:      Appearance: Normal appearance.  HENT:     Head: Normocephalic and atraumatic.     Nose: Nose normal.  Eyes:     Extraocular Movements: Extraocular movements intact.     Conjunctiva/sclera: Conjunctivae normal.     Pupils: Pupils are equal, round, and reactive to light.  Cardiovascular:     Rate and Rhythm: Normal rate and regular rhythm.     Heart sounds: No murmur heard. Pulmonary:     Effort: Pulmonary effort is normal.     Breath sounds: Rales present.     Comments: Bibasilar rales Abdominal:     General: Bowel sounds are normal.     Palpations: Abdomen is soft.     Tenderness: There is no abdominal tenderness.  Musculoskeletal:        General: Tenderness present. No swelling, deformity or  signs of injury. Normal range of motion.     Cervical back: Normal range of motion and neck supple.     Right lower leg: Edema present.     Left lower leg: Edema present.     Comments: Trace edema BLE Chronic R shoulder/arm pain, full ROM, hx of rotator cuff injury Left groin to thigh to ankle pain with therapy, straight leg raise, able to weight bearing and walking-improved.   Skin:    General: Skin is warm and dry.  Neurological:     General: No focal deficit present.     Mental Status: She is alert and oriented to person, place, and time. Mental status is at baseline.     Gait: Gait abnormal.  Psychiatric:        Mood and Affect: Mood normal.        Behavior: Behavior normal.        Thought Content: Thought content normal.        Judgment: Judgment normal.     Labs reviewed: Basic Metabolic Panel: Recent Labs    11/19/23 0904 11/20/23 0254 11/21/23 0310 12/29/23 0247 12/30/23 0324 12/31/23 0533 03/14/24 0313 03/15/24 0301 03/18/24 0452 03/23/24 0000  NA  --  141   < > 139 139   < > 138 142 143 140  K  --  3.8   < > 3.6 3.4*   < > 3.9 3.6 3.6 3.3*  CL  --  112*   < > 109 109   < > 107 110 108 97*  CO2  --  23   < > 23 23   < > 22 24 26  33*  GLUCOSE  --  130*   < > 99 122*   < > 138* 101* 105*  --   BUN  --  13   < > 9 7*   < > 13 12 7* 12  CREATININE  --  <0.30*   < > 0.36* 0.40*   < > 0.44 0.31* 0.31* 0.6  CALCIUM   --  7.9*   < > 8.2* 8.1*   < > 7.7* 7.8* 8.3* 8.8  MG  --  2.0   < > 2.5* 2.3  --  2.4  --   --   --   PHOS 2.2* 1.8*  --   --   --   --  3.0  --   --   --    < > = values in this interval not displayed.   Liver Function Tests: Recent Labs    12/28/23 0411 03/13/24 2052 03/18/24 0452 03/23/24 0000  AST 20 23 19 16   ALT 9 8 6 9   ALKPHOS 49 51 41 49  BILITOT 1.9* 0.3 0.3  --   PROT 4.9* 6.4* 5.2*  --   ALBUMIN 2.9* 3.8 3.2* 3.6   No results for input(s): LIPASE, AMYLASE in the last 8760 hours. No results for input(s): AMMONIA in the last  8760 hours. CBC: Recent Labs    03/17/24 1331 03/17/24 1707 03/18/24 0452 03/23/24 0000  WBC 3.7* 3.6* 3.7* 6.1  NEUTROABS  --  2.2 2.0 3,764.00  HGB 7.6* 7.6* 7.6* 8.3*  HCT 25.1* 24.8* 24.2* 27*  MCV 100.8* 99.6 99.6  --   PLT 128* 136* 146* 259   Cardiac Enzymes: No results for input(s): CKTOTAL, CKMB, CKMBINDEX, TROPONINI in the last 8760 hours. BNP: Invalid input(s): POCBNP CBG: Recent Labs    11/21/23 2332 11/22/23 1213 11/22/23 1651  GLUCAP 140* 148* 114*    Procedures and Imaging Studies During Stay: IR US  Guide Vasc Access Right Result Date: 03/15/2024 CLINICAL DATA:  GI bleed. Active extravasation near the splenic flexure on recent CTA. Previous coil embolization x2 EXAM: IR ULTRASOUND GUIDANCE VASC ACCESS RIGHT; SELECTIVE VISCERAL ARTERIOGRAPHY; ADDITIONAL ARTERIOGRAPHY ANESTHESIA/SEDATION: Intravenous Fentanyl  50mcg and Versed  2mg  were administered by RN during a total moderate (conscious) sedation time of 35 minutes; the patient's level of consciousness and physiological / cardiorespiratory status were monitored continuously by radiology RN under my direct supervision. MEDICATIONS: Lidocaine  1% subcutaneous CONTRAST:  70mL OMNIPAQUE  IOHEXOL  300 MG/ML  SOLN PROCEDURE: The procedure, risks (including but not limited to bleeding, infection, organ damage ), benefits, and alternatives were explained to the patient. Questions regarding the procedure were encouraged and answered. The patient understands and consents to the procedure. Right femoral region prepped and draped in usual sterile fashion. Maximal barrier sterile technique was utilized including caps, mask, sterile gowns, sterile gloves, sterile drape, hand hygiene and skin antiseptic. The right common femoral artery was localized under ultrasound. Under real-time ultrasound guidance, the vessel was accessed with a 21-gauge micropuncture needle, exchanged over a 018 guidewire for a transitional dilator, through  which a 035 guidewire was advanced. Over this, a 5 [place] vascular sheath was placed, through which a 5 [place] RIM catheter was advanced and used to selectively catheterize the inferior mesenteric artery for selective arteriography in multiple projections. A coaxial Renegade microcatheter was advanced into two third-order branches of the IMA for selective arteriography. The microcatheter was removed. The RIM was exchanged for C2, utilized to selectively catheterize the superior mesenteric artery for selective arteriography in multiple projections. The catheter and sheath were removed and hemostasis achieved with the aid of the Celt device under ultrasound and fluoroscopic guidance. The patient tolerated the procedure well. COMPLICATIONS: None immediate FINDINGS: 2 sets of embolization coils from previous interventions noted in the IMA distribution at the level of the splenic flexure and proximal descending colon. No evidence of active extravasation, early draining vein, AVM, or other lesion to suggest a site or etiology of the patient's GI bleeding. IMPRESSION: 1. Negative mesenteric arteriogram. No evidence of active extravasation or other focal lesion to suggest etiology of GI bleed. Electronically Signed   By: JONETTA Faes M.D.   On: 03/15/2024 14:46   IR Angiogram Visceral Selective Result Date: 03/15/2024 CLINICAL DATA:  GI bleed. Active extravasation near the splenic flexure on recent CTA. Previous coil embolization x2 EXAM: IR ULTRASOUND GUIDANCE VASC ACCESS RIGHT; SELECTIVE VISCERAL ARTERIOGRAPHY; ADDITIONAL ARTERIOGRAPHY ANESTHESIA/SEDATION: Intravenous Fentanyl  50mcg and Versed  2mg  were administered by RN during a total moderate (conscious) sedation time of 35 minutes; the patient's level of consciousness and physiological / cardiorespiratory status were monitored continuously by radiology RN under my direct supervision. MEDICATIONS: Lidocaine  1% subcutaneous CONTRAST:  70mL OMNIPAQUE  IOHEXOL  300 MG/ML   SOLN PROCEDURE: The procedure, risks (including but not limited to bleeding, infection, organ damage ), benefits, and alternatives were explained to the patient. Questions regarding the procedure were encouraged and answered. The patient understands and consents to the procedure. Right femoral region prepped and draped in usual sterile fashion. Maximal barrier sterile technique was utilized including caps, mask, sterile gowns, sterile gloves, sterile drape, hand hygiene and skin antiseptic. The right common femoral artery was localized under ultrasound. Under real-time ultrasound guidance, the vessel was accessed with a 21-gauge micropuncture needle, exchanged over a 018 guidewire for a transitional dilator, through which a 035 guidewire was advanced. Over this, a 5 [place] vascular sheath was placed, through which a 5 [place] RIM catheter was advanced and used to selectively catheterize the inferior mesenteric artery for selective arteriography in multiple projections. A coaxial Renegade microcatheter was advanced into two third-order branches of the IMA for selective arteriography. The microcatheter was removed. The RIM was exchanged for C2, utilized to selectively catheterize the superior mesenteric artery for selective arteriography in multiple projections. The catheter and sheath were removed and hemostasis achieved with the aid of the Celt device under ultrasound and fluoroscopic guidance. The patient tolerated the procedure well. COMPLICATIONS: None immediate FINDINGS: 2 sets of embolization coils from previous interventions noted in the IMA distribution at the level of the splenic flexure and proximal descending colon. No evidence of active extravasation, early draining vein, AVM, or other lesion to suggest a site or etiology of the patient's GI bleeding. IMPRESSION: 1. Negative mesenteric arteriogram. No evidence of active extravasation or other focal lesion to suggest etiology of GI bleed. Electronically  Signed   By: JONETTA Faes M.D.   On: 03/15/2024 14:46   IR Angiogram Selective Each Additional Vessel Result Date: 03/15/2024 CLINICAL DATA:  GI bleed. Active extravasation near the splenic flexure on recent CTA. Previous coil embolization x2 EXAM: IR ULTRASOUND GUIDANCE VASC ACCESS RIGHT; SELECTIVE VISCERAL ARTERIOGRAPHY; ADDITIONAL ARTERIOGRAPHY ANESTHESIA/SEDATION: Intravenous Fentanyl  50mcg and Versed  2mg  were administered by RN during a total moderate (conscious) sedation time of 35 minutes; the patient's level of consciousness and physiological / cardiorespiratory status were monitored continuously by radiology RN under my direct supervision. MEDICATIONS: Lidocaine  1% subcutaneous CONTRAST:  70mL OMNIPAQUE  IOHEXOL  300 MG/ML  SOLN PROCEDURE: The procedure, risks (including but not limited to bleeding, infection, organ damage ), benefits, and alternatives were explained to the patient. Questions regarding the procedure were encouraged and answered. The patient understands and consents to the procedure. Right femoral region prepped and draped in usual sterile fashion. Maximal barrier sterile technique was utilized including caps, mask, sterile gowns, sterile gloves, sterile drape, hand hygiene and skin antiseptic. The right common femoral artery was localized under ultrasound. Under real-time ultrasound guidance, the vessel was accessed with a 21-gauge micropuncture needle, exchanged over a 018 guidewire for a transitional dilator, through which a 035 guidewire was advanced. Over this, a 5 [place] vascular sheath was placed, through which a 5 [place] RIM catheter was advanced and used to selectively catheterize the inferior  mesenteric artery for selective arteriography in multiple projections. A coaxial Renegade microcatheter was advanced into two third-order branches of the IMA for selective arteriography. The microcatheter was removed. The RIM was exchanged for C2, utilized to selectively catheterize the  superior mesenteric artery for selective arteriography in multiple projections. The catheter and sheath were removed and hemostasis achieved with the aid of the Celt device under ultrasound and fluoroscopic guidance. The patient tolerated the procedure well. COMPLICATIONS: None immediate FINDINGS: 2 sets of embolization coils from previous interventions noted in the IMA distribution at the level of the splenic flexure and proximal descending colon. No evidence of active extravasation, early draining vein, AVM, or other lesion to suggest a site or etiology of the patient's GI bleeding. IMPRESSION: 1. Negative mesenteric arteriogram. No evidence of active extravasation or other focal lesion to suggest etiology of GI bleed. Electronically Signed   By: JONETTA Faes M.D.   On: 03/15/2024 14:46   IR Angiogram Selective Each Additional Vessel Result Date: 03/15/2024 CLINICAL DATA:  GI bleed. Active extravasation near the splenic flexure on recent CTA. Previous coil embolization x2 EXAM: IR ULTRASOUND GUIDANCE VASC ACCESS RIGHT; SELECTIVE VISCERAL ARTERIOGRAPHY; ADDITIONAL ARTERIOGRAPHY ANESTHESIA/SEDATION: Intravenous Fentanyl  50mcg and Versed  2mg  were administered by RN during a total moderate (conscious) sedation time of 35 minutes; the patient's level of consciousness and physiological / cardiorespiratory status were monitored continuously by radiology RN under my direct supervision. MEDICATIONS: Lidocaine  1% subcutaneous CONTRAST:  70mL OMNIPAQUE  IOHEXOL  300 MG/ML  SOLN PROCEDURE: The procedure, risks (including but not limited to bleeding, infection, organ damage ), benefits, and alternatives were explained to the patient. Questions regarding the procedure were encouraged and answered. The patient understands and consents to the procedure. Right femoral region prepped and draped in usual sterile fashion. Maximal barrier sterile technique was utilized including caps, mask, sterile gowns, sterile gloves, sterile drape,  hand hygiene and skin antiseptic. The right common femoral artery was localized under ultrasound. Under real-time ultrasound guidance, the vessel was accessed with a 21-gauge micropuncture needle, exchanged over a 018 guidewire for a transitional dilator, through which a 035 guidewire was advanced. Over this, a 5 [place] vascular sheath was placed, through which a 5 [place] RIM catheter was advanced and used to selectively catheterize the inferior mesenteric artery for selective arteriography in multiple projections. A coaxial Renegade microcatheter was advanced into two third-order branches of the IMA for selective arteriography. The microcatheter was removed. The RIM was exchanged for C2, utilized to selectively catheterize the superior mesenteric artery for selective arteriography in multiple projections. The catheter and sheath were removed and hemostasis achieved with the aid of the Celt device under ultrasound and fluoroscopic guidance. The patient tolerated the procedure well. COMPLICATIONS: None immediate FINDINGS: 2 sets of embolization coils from previous interventions noted in the IMA distribution at the level of the splenic flexure and proximal descending colon. No evidence of active extravasation, early draining vein, AVM, or other lesion to suggest a site or etiology of the patient's GI bleeding. IMPRESSION: 1. Negative mesenteric arteriogram. No evidence of active extravasation or other focal lesion to suggest etiology of GI bleed. Electronically Signed   By: JONETTA Faes M.D.   On: 03/15/2024 14:46   IR Angiogram Selective Each Additional Vessel Result Date: 03/15/2024 CLINICAL DATA:  GI bleed. Active extravasation near the splenic flexure on recent CTA. Previous coil embolization x2 EXAM: IR ULTRASOUND GUIDANCE VASC ACCESS RIGHT; SELECTIVE VISCERAL ARTERIOGRAPHY; ADDITIONAL ARTERIOGRAPHY ANESTHESIA/SEDATION: Intravenous Fentanyl  50mcg and Versed  2mg  were administered by RN  during a total moderate  (conscious) sedation time of 35 minutes; the patient's level of consciousness and physiological / cardiorespiratory status were monitored continuously by radiology RN under my direct supervision. MEDICATIONS: Lidocaine  1% subcutaneous CONTRAST:  70mL OMNIPAQUE  IOHEXOL  300 MG/ML  SOLN PROCEDURE: The procedure, risks (including but not limited to bleeding, infection, organ damage ), benefits, and alternatives were explained to the patient. Questions regarding the procedure were encouraged and answered. The patient understands and consents to the procedure. Right femoral region prepped and draped in usual sterile fashion. Maximal barrier sterile technique was utilized including caps, mask, sterile gowns, sterile gloves, sterile drape, hand hygiene and skin antiseptic. The right common femoral artery was localized under ultrasound. Under real-time ultrasound guidance, the vessel was accessed with a 21-gauge micropuncture needle, exchanged over a 018 guidewire for a transitional dilator, through which a 035 guidewire was advanced. Over this, a 5 [place] vascular sheath was placed, through which a 5 [place] RIM catheter was advanced and used to selectively catheterize the inferior mesenteric artery for selective arteriography in multiple projections. A coaxial Renegade microcatheter was advanced into two third-order branches of the IMA for selective arteriography. The microcatheter was removed. The RIM was exchanged for C2, utilized to selectively catheterize the superior mesenteric artery for selective arteriography in multiple projections. The catheter and sheath were removed and hemostasis achieved with the aid of the Celt device under ultrasound and fluoroscopic guidance. The patient tolerated the procedure well. COMPLICATIONS: None immediate FINDINGS: 2 sets of embolization coils from previous interventions noted in the IMA distribution at the level of the splenic flexure and proximal descending colon. No evidence of  active extravasation, early draining vein, AVM, or other lesion to suggest a site or etiology of the patient's GI bleeding. IMPRESSION: 1. Negative mesenteric arteriogram. No evidence of active extravasation or other focal lesion to suggest etiology of GI bleed. Electronically Signed   By: JONETTA Faes M.D.   On: 03/15/2024 14:46   IR Angiogram Visceral Selective Result Date: 03/15/2024 CLINICAL DATA:  GI bleed. Active extravasation near the splenic flexure on recent CTA. Previous coil embolization x2 EXAM: IR ULTRASOUND GUIDANCE VASC ACCESS RIGHT; SELECTIVE VISCERAL ARTERIOGRAPHY; ADDITIONAL ARTERIOGRAPHY ANESTHESIA/SEDATION: Intravenous Fentanyl  50mcg and Versed  2mg  were administered by RN during a total moderate (conscious) sedation time of 35 minutes; the patient's level of consciousness and physiological / cardiorespiratory status were monitored continuously by radiology RN under my direct supervision. MEDICATIONS: Lidocaine  1% subcutaneous CONTRAST:  70mL OMNIPAQUE  IOHEXOL  300 MG/ML  SOLN PROCEDURE: The procedure, risks (including but not limited to bleeding, infection, organ damage ), benefits, and alternatives were explained to the patient. Questions regarding the procedure were encouraged and answered. The patient understands and consents to the procedure. Right femoral region prepped and draped in usual sterile fashion. Maximal barrier sterile technique was utilized including caps, mask, sterile gowns, sterile gloves, sterile drape, hand hygiene and skin antiseptic. The right common femoral artery was localized under ultrasound. Under real-time ultrasound guidance, the vessel was accessed with a 21-gauge micropuncture needle, exchanged over a 018 guidewire for a transitional dilator, through which a 035 guidewire was advanced. Over this, a 5 [place] vascular sheath was placed, through which a 5 [place] RIM catheter was advanced and used to selectively catheterize the inferior mesenteric artery for selective  arteriography in multiple projections. A coaxial Renegade microcatheter was advanced into two third-order branches of the IMA for selective arteriography. The microcatheter was removed. The RIM was exchanged for C2, utilized to selectively catheterize the  superior mesenteric artery for selective arteriography in multiple projections. The catheter and sheath were removed and hemostasis achieved with the aid of the Celt device under ultrasound and fluoroscopic guidance. The patient tolerated the procedure well. COMPLICATIONS: None immediate FINDINGS: 2 sets of embolization coils from previous interventions noted in the IMA distribution at the level of the splenic flexure and proximal descending colon. No evidence of active extravasation, early draining vein, AVM, or other lesion to suggest a site or etiology of the patient's GI bleeding. IMPRESSION: 1. Negative mesenteric arteriogram. No evidence of active extravasation or other focal lesion to suggest etiology of GI bleed. Electronically Signed   By: JONETTA Faes M.D.   On: 03/15/2024 14:46   CT Angio Abd/Pel W and/or Wo Contrast Result Date: 03/13/2024 CLINICAL DATA:  Lower gastrointestinal bleeding EXAM: CTA ABDOMEN AND PELVIS WITHOUT AND WITH CONTRAST TECHNIQUE: Multidetector CT imaging of the abdomen and pelvis was performed using the standard protocol during bolus administration of intravenous contrast. Multiplanar reconstructed images and MIPs were obtained and reviewed to evaluate the vascular anatomy. RADIATION DOSE REDUCTION: This exam was performed according to the departmental dose-optimization program which includes automated exposure control, adjustment of the mA and/or kV according to patient size and/or use of iterative reconstruction technique. CONTRAST:  OMNIPAQUE  IOHEXOL  350 MG/ML SOLN COMPARISON:  08/17/2018, 12/27/2023 FINDINGS: VASCULAR Aorta: Normal caliber aorta without aneurysm, dissection, vasculitis or significant stenosis. Stable  atherosclerosis. Celiac: Patent without evidence of aneurysm, dissection, vasculitis or significant stenosis. SMA: Patent without evidence of aneurysm, dissection, vasculitis or significant stenosis. Renals: Both renal arteries are patent without evidence of aneurysm, dissection, vasculitis, or fibromuscular dysplasia. Stable mild stenosis of both right renal artery, with wide patency of the left renal artery. IMA: Patent without evidence of aneurysm, dissection, vasculitis or significant stenosis. Embolic coils are seen within branches of the IMA near the splenic flexure of the colon and proximal descending colon. Inflow: Patent without evidence of aneurysm, dissection, vasculitis or significant stenosis. Proximal Outflow: Bilateral common femoral and visualized portions of the superficial and profunda femoral arteries are patent without evidence of aneurysm, dissection, vasculitis or significant stenosis. Veins: No obvious venous abnormality within the limitations of this arterial phase study. Review of the MIP images confirms the above findings. NON-VASCULAR Lower chest: No acute pleural or parenchymal lung disease. Hepatobiliary: Cholelithiasis without cholecystitis. Mild hepatic steatosis. No focal liver abnormality. Pancreas: Unremarkable. No pancreatic ductal dilatation or surrounding inflammatory changes. Spleen: Normal in size without focal abnormality. Adrenals/Urinary Tract: Stable left extrarenal pelvis. The kidneys enhance normally. The adrenals and bladder are unremarkable. Stomach/Bowel: There is intraluminal accumulation of contrast within the distal transverse colon and splenic flexure of the colon, consistent with acute gastrointestinal hemorrhage. Hemorrhage is likely due to extensive diverticulosis in this region. This is slightly proximal to the location of the hemorrhage seen on prior CT exam. No bowel obstruction or ileus. Diffuse colonic diverticulosis with no evidence of acute  diverticulitis. Normal appendix right lower quadrant. Lymphatic: No pathologic adenopathy. Reproductive: Uterus and bilateral adnexa are unremarkable. Other: No free fluid or free intraperitoneal gas. No abdominal wall hernia. Musculoskeletal: No acute or destructive bony abnormalities. Chronic L1 compression deformity again noted. Reconstructed images demonstrate no additional findings. IMPRESSION: VASCULAR 1. Active gastrointestinal hemorrhage within the distal transverse colon and splenic flexure of the colon, likely due to diverticulosis. 2.  Aortic Atherosclerosis (ICD10-I70.0). 3. 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 NON-VASCULAR 1. Cholelithiasis without cholecystitis.  2. Diverticulosis without diverticulitis. Critical Value/emergent results were called by telephone at the time of interpretation on 03/13/2024 at 10:01 pm to provider [facility] , who verbally acknowledged these results. Electronically Signed   By: Ozell Daring M.D.   On: 03/13/2024 22:03    Assessment/Plan:   GI bleed Hospitalized 03/13/2024 to 03/18/2024 for acute on chronic anemia, lower GI bleed, thrombocytopenia, needs repeat CBC differential and CMP/eGFR, Hgb 7.6 03/18/2024(transfused due to he Hgb 6.9 03/14/2024).  Underwent mesenteric angiogram to attempt another coiling but no active bleeding was identified             Hospitalized 12/27/2023 to 01/01/2024 for lower GI bleeding, CTA with actively bleeding in the descending colon C/W acute diverticular bleeding.  GI was consulted and IR performed embolization.  Status post 2 units PRBC 2/2 Hgb 6.4 Hospitalized 11/18/23-11/26/23 for lower GI bleed, hx of diverticulosis with bleeding, Hx of multiple GI bleed, 09/2023 Colonoscopy/ EGD showed gastritis and ischemic colitis,  transfused due to Hgb 5.3.   GERD (gastroesophageal reflux disease) Stable,  takes Pantoprazole  bid. Followed by GI  Acute on chronic  anemia Hx of GI bleed, transfused,  takes Iron , Hgb 7.6 03/18/2024>> 8.3 03/23/24, hematology consulted to follow pancytopenia, no further workup recommended  Pulmonary nodule 1 cm or greater in diameter Pulmonary nodule pretracheal lymph node 1.3x1.2 cm CT chest 08/09/21, failed follow-up chest CT due to deconditioning  Hypertension Blood pressure is controlled,  takes Torsemide   Hyperlipidemia  takes Zocor   Recurrent depression Stable, continue sertraline  1  Senile osteoporosis Continue Fosamax, vitamin D   Osteoarthritis, multiple sites  OA multiple sites, chronic right shoulder/R arm pain(rotator cuff injury), takes Tylenol   Restless leg syndrome Stable, takes Requip   Mixed stress and urge urinary incontinence No urinary retention, on Oxybutynin   Chronic heart failure with preserved ejection fraction (HFpEF) (HCC)  mild edema BLE, taking Torsemide , Bun/creat 12/0.6 03/23/24     Patient is being discharged with the following home health services:    Patient is being discharged with the following durable medical equipment:    Patient has been advised to f/u with their PCP in 1-2 weeks to for a transitions of care visit.  Social services at their facility was responsible for arranging this appointment.  Pt was provided with adequate prescriptions of noncontrolled medications to reach the scheduled appointment .  For controlled substances, a limited supply was provided as appropriate for the individual patient.  If the pt normally receives these medications from a pain clinic or has a contract with another physician, these medications should be received from that clinic or physician only).    Future labs/tests needed: CBC/diff 04/01/24

## 2024-03-31 NOTE — Assessment & Plan Note (Signed)
 mild edema BLE, taking Torsemide , Bun/creat 12/0.6 03/23/24

## 2024-03-31 NOTE — Assessment & Plan Note (Signed)
 Stable, takes Pantoprazole  bid. Followed by GI

## 2024-03-31 NOTE — Assessment & Plan Note (Signed)
 Continue Fosamax , vitamin-D.

## 2024-03-31 NOTE — Assessment & Plan Note (Signed)
 Hospitalized 03/13/2024 to 03/18/2024 for acute on chronic anemia, lower GI bleed, thrombocytopenia, needs repeat CBC differential and CMP/eGFR, Hgb 7.6 03/18/2024(transfused due to he Hgb 6.9 03/14/2024).  Underwent mesenteric angiogram to attempt another coiling but no active bleeding was identified             Hospitalized 12/27/2023 to 01/01/2024 for lower GI bleeding, CTA with actively bleeding in the descending colon C/W acute diverticular bleeding.  GI was consulted and IR performed embolization.  Status post 2 units PRBC 2/2 Hgb 6.4 Hospitalized 11/18/23-11/26/23 for lower GI bleed, hx of diverticulosis with bleeding, Hx of multiple GI bleed, 09/2023 Colonoscopy/ EGD showed gastritis and ischemic colitis,  transfused due to Hgb 5.3.

## 2024-03-31 NOTE — Assessment & Plan Note (Signed)
 Stable, continue sertraline  1

## 2024-03-31 NOTE — Assessment & Plan Note (Signed)
 OA multiple sites, chronic right shoulder/R arm pain(rotator cuff injury), takes Tylenol 

## 2024-03-31 NOTE — Assessment & Plan Note (Signed)
Blood pressure is controlled,  takes Torsemide °           °

## 2024-03-31 NOTE — Assessment & Plan Note (Signed)
 Pulmonary nodule pretracheal lymph node 1.3x1.2 cm CT chest 08/09/21, failed follow-up chest CT due to deconditioning

## 2024-03-31 NOTE — Assessment & Plan Note (Signed)
 takes Zocor 

## 2024-03-31 NOTE — Assessment & Plan Note (Signed)
 Hx of GI bleed, transfused,  takes Iron , Hgb 7.6 03/18/2024>> 8.3 03/23/24, hematology consulted to follow pancytopenia, no further workup recommended

## 2024-03-31 NOTE — Assessment & Plan Note (Signed)
Stable, takes Requip 

## 2024-03-31 NOTE — Assessment & Plan Note (Signed)
 No urinary retention, on Oxybutynin 

## 2024-04-01 ENCOUNTER — Encounter: Payer: Self-pay | Admitting: Nurse Practitioner

## 2024-04-01 NOTE — Assessment & Plan Note (Signed)
 04/01/24 Na 144, K 2.7, Bun 13, creat 0.55, wbc 4.1, Hgb 8.8, plt 191, Kcl x1, increase Kcl to 40meq every day, BMP.

## 2024-04-01 NOTE — Progress Notes (Signed)
 This encounter was created in error - please disregard.

## 2024-04-12 DIAGNOSIS — N3946 Mixed incontinence: Secondary | ICD-10-CM | POA: Diagnosis not present

## 2024-04-12 DIAGNOSIS — M6281 Muscle weakness (generalized): Secondary | ICD-10-CM | POA: Diagnosis not present

## 2024-04-12 DIAGNOSIS — R29898 Other symptoms and signs involving the musculoskeletal system: Secondary | ICD-10-CM | POA: Diagnosis not present

## 2024-04-12 DIAGNOSIS — Z8616 Personal history of COVID-19: Secondary | ICD-10-CM | POA: Diagnosis not present

## 2024-04-12 DIAGNOSIS — R41841 Cognitive communication deficit: Secondary | ICD-10-CM | POA: Diagnosis not present

## 2024-04-14 DIAGNOSIS — R41841 Cognitive communication deficit: Secondary | ICD-10-CM | POA: Diagnosis not present

## 2024-04-14 DIAGNOSIS — R29898 Other symptoms and signs involving the musculoskeletal system: Secondary | ICD-10-CM | POA: Diagnosis not present

## 2024-04-14 DIAGNOSIS — M6281 Muscle weakness (generalized): Secondary | ICD-10-CM | POA: Diagnosis not present

## 2024-04-14 DIAGNOSIS — N3946 Mixed incontinence: Secondary | ICD-10-CM | POA: Diagnosis not present

## 2024-04-14 DIAGNOSIS — Z8616 Personal history of COVID-19: Secondary | ICD-10-CM | POA: Diagnosis not present

## 2024-04-16 DIAGNOSIS — R41841 Cognitive communication deficit: Secondary | ICD-10-CM | POA: Diagnosis not present

## 2024-04-19 DIAGNOSIS — Z8616 Personal history of COVID-19: Secondary | ICD-10-CM | POA: Diagnosis not present

## 2024-04-19 DIAGNOSIS — R29898 Other symptoms and signs involving the musculoskeletal system: Secondary | ICD-10-CM | POA: Diagnosis not present

## 2024-04-19 DIAGNOSIS — M6281 Muscle weakness (generalized): Secondary | ICD-10-CM | POA: Diagnosis not present

## 2024-04-19 DIAGNOSIS — N3946 Mixed incontinence: Secondary | ICD-10-CM | POA: Diagnosis not present

## 2024-04-21 DIAGNOSIS — R41841 Cognitive communication deficit: Secondary | ICD-10-CM | POA: Diagnosis not present

## 2024-04-22 DIAGNOSIS — N3946 Mixed incontinence: Secondary | ICD-10-CM | POA: Diagnosis not present

## 2024-04-22 DIAGNOSIS — R29898 Other symptoms and signs involving the musculoskeletal system: Secondary | ICD-10-CM | POA: Diagnosis not present

## 2024-04-22 DIAGNOSIS — M6281 Muscle weakness (generalized): Secondary | ICD-10-CM | POA: Diagnosis not present

## 2024-04-22 DIAGNOSIS — Z8616 Personal history of COVID-19: Secondary | ICD-10-CM | POA: Diagnosis not present

## 2024-04-23 DIAGNOSIS — R41841 Cognitive communication deficit: Secondary | ICD-10-CM | POA: Diagnosis not present

## 2024-04-26 DIAGNOSIS — N3946 Mixed incontinence: Secondary | ICD-10-CM | POA: Diagnosis not present

## 2024-04-26 DIAGNOSIS — R29898 Other symptoms and signs involving the musculoskeletal system: Secondary | ICD-10-CM | POA: Diagnosis not present

## 2024-04-26 DIAGNOSIS — M6281 Muscle weakness (generalized): Secondary | ICD-10-CM | POA: Diagnosis not present

## 2024-04-26 DIAGNOSIS — Z8616 Personal history of COVID-19: Secondary | ICD-10-CM | POA: Diagnosis not present

## 2024-04-28 DIAGNOSIS — R41841 Cognitive communication deficit: Secondary | ICD-10-CM | POA: Diagnosis not present

## 2024-04-28 DIAGNOSIS — F4001 Agoraphobia with panic disorder: Secondary | ICD-10-CM | POA: Diagnosis not present

## 2024-04-28 DIAGNOSIS — F330 Major depressive disorder, recurrent, mild: Secondary | ICD-10-CM | POA: Diagnosis not present

## 2024-04-29 DIAGNOSIS — Z8616 Personal history of COVID-19: Secondary | ICD-10-CM | POA: Diagnosis not present

## 2024-04-29 DIAGNOSIS — M6281 Muscle weakness (generalized): Secondary | ICD-10-CM | POA: Diagnosis not present

## 2024-04-29 DIAGNOSIS — R29898 Other symptoms and signs involving the musculoskeletal system: Secondary | ICD-10-CM | POA: Diagnosis not present

## 2024-04-29 DIAGNOSIS — N3946 Mixed incontinence: Secondary | ICD-10-CM | POA: Diagnosis not present

## 2024-04-30 DIAGNOSIS — R41841 Cognitive communication deficit: Secondary | ICD-10-CM | POA: Diagnosis not present

## 2024-05-05 DIAGNOSIS — R41841 Cognitive communication deficit: Secondary | ICD-10-CM | POA: Diagnosis not present

## 2024-05-07 DIAGNOSIS — R41841 Cognitive communication deficit: Secondary | ICD-10-CM | POA: Diagnosis not present

## 2024-05-12 DIAGNOSIS — R41841 Cognitive communication deficit: Secondary | ICD-10-CM | POA: Diagnosis not present

## 2024-05-14 DIAGNOSIS — R41841 Cognitive communication deficit: Secondary | ICD-10-CM | POA: Diagnosis not present

## 2024-05-19 DIAGNOSIS — R41841 Cognitive communication deficit: Secondary | ICD-10-CM | POA: Diagnosis not present

## 2024-05-19 DIAGNOSIS — F4001 Agoraphobia with panic disorder: Secondary | ICD-10-CM | POA: Diagnosis not present

## 2024-05-19 DIAGNOSIS — F330 Major depressive disorder, recurrent, mild: Secondary | ICD-10-CM | POA: Diagnosis not present

## 2024-05-21 DIAGNOSIS — R41841 Cognitive communication deficit: Secondary | ICD-10-CM | POA: Diagnosis not present

## 2024-05-25 DIAGNOSIS — N3946 Mixed incontinence: Secondary | ICD-10-CM | POA: Diagnosis not present

## 2024-05-25 DIAGNOSIS — M6281 Muscle weakness (generalized): Secondary | ICD-10-CM | POA: Diagnosis not present

## 2024-05-25 DIAGNOSIS — Z8616 Personal history of COVID-19: Secondary | ICD-10-CM | POA: Diagnosis not present

## 2024-05-25 DIAGNOSIS — R29898 Other symptoms and signs involving the musculoskeletal system: Secondary | ICD-10-CM | POA: Diagnosis not present

## 2024-05-26 DIAGNOSIS — R41841 Cognitive communication deficit: Secondary | ICD-10-CM | POA: Diagnosis not present

## 2024-05-27 DIAGNOSIS — R41841 Cognitive communication deficit: Secondary | ICD-10-CM | POA: Diagnosis not present

## 2024-06-02 DIAGNOSIS — R41841 Cognitive communication deficit: Secondary | ICD-10-CM | POA: Diagnosis not present

## 2024-06-04 DIAGNOSIS — R41841 Cognitive communication deficit: Secondary | ICD-10-CM | POA: Diagnosis not present

## 2024-06-08 DIAGNOSIS — R41841 Cognitive communication deficit: Secondary | ICD-10-CM | POA: Diagnosis not present

## 2024-06-10 ENCOUNTER — Encounter: Payer: Self-pay | Admitting: Internal Medicine

## 2024-06-10 ENCOUNTER — Non-Acute Institutional Stay: Admitting: Internal Medicine

## 2024-06-10 DIAGNOSIS — K922 Gastrointestinal hemorrhage, unspecified: Secondary | ICD-10-CM | POA: Diagnosis not present

## 2024-06-10 DIAGNOSIS — E785 Hyperlipidemia, unspecified: Secondary | ICD-10-CM | POA: Diagnosis not present

## 2024-06-10 DIAGNOSIS — M810 Age-related osteoporosis without current pathological fracture: Secondary | ICD-10-CM | POA: Diagnosis not present

## 2024-06-10 DIAGNOSIS — D649 Anemia, unspecified: Secondary | ICD-10-CM

## 2024-06-10 DIAGNOSIS — N3946 Mixed incontinence: Secondary | ICD-10-CM | POA: Diagnosis not present

## 2024-06-10 DIAGNOSIS — K219 Gastro-esophageal reflux disease without esophagitis: Secondary | ICD-10-CM | POA: Diagnosis not present

## 2024-06-10 DIAGNOSIS — M150 Primary generalized (osteo)arthritis: Secondary | ICD-10-CM

## 2024-06-10 DIAGNOSIS — F339 Major depressive disorder, recurrent, unspecified: Secondary | ICD-10-CM

## 2024-06-10 DIAGNOSIS — G2581 Restless legs syndrome: Secondary | ICD-10-CM | POA: Diagnosis not present

## 2024-06-10 DIAGNOSIS — R41841 Cognitive communication deficit: Secondary | ICD-10-CM | POA: Diagnosis not present

## 2024-06-10 NOTE — Progress Notes (Signed)
 Location:  [facility] Service:  ALF ([id number])  Provider:   Code Status: DNR Goals of Care:     03/13/2024    8:28 PM  Advanced Directives  Does Patient Have a Medical Advance Directive? No  Does patient want to make changes to medical advance directive? No - Patient declined  Would patient like information on creating a medical advance directive? No - Patient declined     Chief Complaint  Patient presents with   Care Management    HPI: Patient is a 83 y.o. female seen today for medical management of chronic diseases.    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  she is former smoker,  history of pulmonary nodules, hypertension,  osteoporosis, urge incontinence   Recurent GI bleeds Last Admission in 03/2024  Had not had any new Bleeding since then She had refused Previous detail work up  [employer]   Continues to do well  Walking with her walker No pain  Does have Right Frozen Shoulder Also c/o Constipation Mood is stable   Wt Readings from Last 3 Encounters:  06/10/24 165 lb (74.8 kg)  03/31/24 157 lb 6.4 oz (71.4 kg)  03/19/24 157 lb 1.6 oz (71.3 kg)     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, left side   Sleeping excessive    Tinnitus of both ears     Past Surgical History:  Procedure Laterality Date   BALLOON DILATION N/A  05/08/2021   Procedure: BALLOON DILATION;  Surgeon: Saintclair Jasper, MD;  Location: [facility] ENDOSCOPY;  Service: Gastroenterology;  Laterality: N/A;   BASAL CELL CARCINOMA EXCISION  2001   off my nose, left side   BIOPSY  05/08/2021   Procedure: BIOPSY;  Surgeon: Saintclair Jasper, MD;  Location: [facility] ENDOSCOPY;  Service: Gastroenterology;;   BIOPSY  04/25/2023   Procedure: BIOPSY;  Surgeon: Dianna Specking, MD;  Location: [facility] ENDOSCOPY;  Service: Gastroenterology;;   BLEPHAROPLASTY Bilateral    BONE BIOPSY  10/26/2023   Procedure: BIOPSY, GI;  Surgeon: Elicia Claw, MD;  Location: [facility] ENDOSCOPY;  Service: Gastroenterology;;   CATARACT EXTRACTION W/ INTRAOCULAR LENS  IMPLANT, BILATERAL Bilateral 1990's   COLONOSCOPY N/A 10/26/2023   Procedure: COLONOSCOPY;  Surgeon: Elicia Claw, MD;  Location: [facility] ENDOSCOPY;  Service: Gastroenterology;  Laterality: N/A;   COLONOSCOPY WITH PROPOFOL  N/A 05/04/2022   Procedure: COLONOSCOPY WITH PROPOFOL ;  Surgeon: Saintclair Jasper, MD;  Location: [facility] ENDOSCOPY;  Service: Gastroenterology;  Laterality: N/A;   COLONOSCOPY WITH PROPOFOL  N/A 04/25/2023   Procedure: COLONOSCOPY WITH PROPOFOL ;  Surgeon: Dianna Specking, MD;  Location: [facility] ENDOSCOPY;  Service: Gastroenterology;  Laterality: N/A;   ENTEROSCOPY N/A 11/20/2023   Procedure: ENTEROSCOPY;  Surgeon: Dianna Specking, MD;  Location: [facility] ENDOSCOPY;  Service:  Gastroenterology;  Laterality: N/A;   ESOPHAGOGASTRODUODENOSCOPY N/A 10/26/2023   Procedure: EGD (ESOPHAGOGASTRODUODENOSCOPY);  Surgeon: Elicia Claw, MD;  Location: [facility]COPY;  Service: Gastroenterology;  Laterality: N/A;   ESOPHAGOGASTRODUODENOSCOPY N/A 11/20/2023   Procedure: EGD (ESOPHAGOGASTRODUODENOSCOPY);  Surgeon: Dianna Specking, MD;  Location: [facility] ENDOSCOPY;  Service: Gastroenterology;  Laterality: N/A;   ESOPHAGOGASTRODUODENOSCOPY (EGD) WITH PROPOFOL  N/A 05/08/2021   Procedure: ESOPHAGOGASTRODUODENOSCOPY (EGD) WITH PROPOFOL ;  Surgeon: Saintclair Jasper, MD;   Location: [facility] ENDOSCOPY;  Service: Gastroenterology;  Laterality: N/A;   ESOPHAGOGASTRODUODENOSCOPY (EGD) WITH PROPOFOL  N/A 01/11/2022   Procedure: ESOPHAGOGASTRODUODENOSCOPY (EGD) WITH PROPOFOL ;  Surgeon: Rosalie Kitchens, MD;  Location: [facility] ENDOSCOPY;  Service: Gastroenterology;  Laterality: N/A;   ESOPHAGOGASTRODUODENOSCOPY (EGD) WITH PROPOFOL  N/A 01/06/2023   Procedure: ESOPHAGOGASTRODUODENOSCOPY (EGD) WITH PROPOFOL ;  Surgeon: Rosalie Kitchens, MD;  Location: [facility] ENDOSCOPY;  Service: Gastroenterology;  Laterality: N/A;   ESOPHAGOGASTRODUODENOSCOPY (EGD) WITH PROPOFOL  N/A 04/21/2023   Procedure: ESOPHAGOGASTRODUODENOSCOPY (EGD) WITH PROPOFOL ;  Surgeon: Dianna Specking, MD;  Location: [facility] ENDOSCOPY;  Service: Gastroenterology;  Laterality: N/A;   EYE SURGERY Bilateral    to improve vision after cataract OR   GIVENS CAPSULE STUDY N/A 04/21/2023   Procedure: GIVENS CAPSULE STUDY;  Surgeon: Dianna Specking, MD;  Location: [facility] ENDOSCOPY;  Service: Gastroenterology;  Laterality: N/A;   GIVENS CAPSULE STUDY N/A 11/18/2023   Procedure: IMAGING PROCEDURE, GI TRACT, INTRALUMINAL, VIA CAPSULE;  Surgeon: Dianna Specking, MD;  Location: [facility] ENDOSCOPY;  Service: Gastroenterology;  Laterality: N/A;   HEMOSTASIS CLIP PLACEMENT  10/26/2023   Procedure: CONTROL OF HEMORRHAGE, GI TRACT, ENDOSCOPIC, BY CLIPPING OR OVERSEWING;  Surgeon: Elicia Claw, MD;  Location: [facility] ENDOSCOPY;  Service: Gastroenterology;;   IR ANGIOGRAM FOLLOW UP STUDY  12/19/2020   IR ANGIOGRAM SELECTIVE EACH ADDITIONAL VESSEL  12/19/2020   IR ANGIOGRAM SELECTIVE EACH ADDITIONAL VESSEL  12/19/2020   IR ANGIOGRAM SELECTIVE EACH ADDITIONAL VESSEL  12/28/2023   IR ANGIOGRAM SELECTIVE EACH ADDITIONAL VESSEL  03/14/2024   IR ANGIOGRAM SELECTIVE EACH ADDITIONAL VESSEL  03/14/2024   IR ANGIOGRAM SELECTIVE EACH ADDITIONAL VESSEL  03/14/2024   IR ANGIOGRAM VISCERAL SELECTIVE  12/19/2020   IR ANGIOGRAM VISCERAL SELECTIVE  12/28/2023   IR ANGIOGRAM VISCERAL SELECTIVE   03/14/2024   IR ANGIOGRAM VISCERAL SELECTIVE  03/14/2024   IR EMBO ART  VEN HEMORR LYMPH EXTRAV  INC GUIDE ROADMAPPING  12/19/2020   IR EMBO ART  VEN HEMORR LYMPH EXTRAV  INC GUIDE ROADMAPPING  12/28/2023   IR US  GUIDE VASC ACCESS RIGHT  12/19/2020   IR US  GUIDE VASC ACCESS RIGHT  12/28/2023   IR US  GUIDE VASC ACCESS RIGHT  03/14/2024   JOINT REPLACEMENT     POLYPECTOMY  05/04/2022   Procedure: POLYPECTOMY;  Surgeon: Saintclair Jasper, MD;  Location: [facility] ENDOSCOPY;  Service: Gastroenterology;;   POLYPECTOMY  10/26/2023   Procedure: POLYPECTOMY, INTESTINE;  Surgeon: Elicia Claw, MD;  Location: [facility] ENDOSCOPY;  Service: Gastroenterology;;   STAPEDES SURGERY Left    scraped stapedes because it was sticking when it wasn't suppose to   TONSILLECTOMY AND ADENOIDECTOMY  1946   TOTAL KNEE ARTHROPLASTY Left ~ 2008   TOTAL KNEE ARTHROPLASTY  12/20/2011   Procedure: TOTAL KNEE ARTHROPLASTY;  Surgeon: Elspeth JONELLE Her, MD;  Location: [facility] OR;  Service: Orthopedics;  Laterality: Right;  Right Total Knee Arthroplasty   TUBAL LIGATION  1980's    Allergies[1]  Outpatient Encounter Medications as of 06/10/2024  Medication Sig   Acetaminophen  (TYLENOL ) 325 MG CAPS Take 650 mg by mouth every 6 (six) hours as needed (  pain, headache or fever.).   albuterol  (VENTOLIN  HFA) 108 (90 Base) MCG/ACT inhaler Inhale 2 puffs into the lungs every 4 (four) hours as needed for wheezing or shortness of breath.   alendronate (FOSAMAX) 70 MG tablet Take 70 mg by mouth every Sunday. Take with a full glass of water on an empty stomach.   Ascorbic Acid  (VITAMIN C ) 1000 MG tablet Take 1,000 mg by mouth every morning.   cetirizine  (ZYRTEC ) 5 MG chewable tablet Chew 5 mg by mouth daily as needed for allergies.   Cholecalciferol  (VITAMIN D3) 50 MCG (2000 UT) TABS Take 2,000 Units by mouth every morning.   cyanocobalamin  (VITAMIN B12) 1000 MCG tablet Take 1,000 mcg by mouth daily.   Dorzolamide  HCl-Timolol  Mal PF 2-0.5 % SOLN Place 1 drop  into both eyes in the morning and at bedtime.   ferrous gluconate  (FERGON) 240 (27 FE) MG tablet Take 240 mg by mouth every morning. Take without food   fluticasone  (FLONASE ) 50 MCG/ACT nasal spray Place 1 spray into both nostrils as needed for allergies or rhinitis.   guaifenesin  (ROBITUSSIN) 100 MG/5ML syrup Take 10 mLs by mouth every 4 (four) hours as needed for cough.   latanoprost  (XALATAN ) 0.005 % ophthalmic solution Place 1 drop into both eyes at bedtime.   loperamide  (IMODIUM  A-D) 2 MG tablet Take 4 mg by mouth as needed for diarrhea or loose stools.   Multiple Vitamins-Minerals (PRESERVISION AREDS 2) CAPS Take 1 capsule by mouth 2 (two) times daily.   Netarsudil  Dimesylate (RHOPRESSA ) 0.02 % SOLN Place 1 drop into both eyes at bedtime.   oxybutynin  (DITROPAN -XL) 10 MG 24 hr tablet Take 10 mg by mouth at bedtime.   pantoprazole  (PROTONIX ) 40 MG tablet Take 1 tablet (40 mg total) by mouth 2 (two) times daily before a meal.   Polyethylene Glycol 3350  (MIRALAX  PO) Take 8.6 g by mouth daily as needed.   potassium chloride  SA (KLOR-CON  M) 20 MEQ tablet Take 20 mEq by mouth daily.   Probiotic Product (ALIGN) 4 MG CAPS Take 4 mg by mouth at bedtime.   rOPINIRole  (REQUIP ) 0.5 MG tablet Take 1 tablet (0.5 mg total) by mouth daily as needed.   senna (SENOKOT) 8.6 MG TABS tablet Take 2 tablets by mouth at bedtime.   sertraline  (ZOLOFT ) 50 MG tablet Take 1.5 tablets (75 mg total) by mouth daily.   simvastatin  (ZOCOR ) 20 MG tablet Take 20 mg by mouth every evening. HOLD WHILE ON PAXLOVID    Sodium Fluoride  (PREVIDENT 5000 BOOSTER PLUS) 1.1 % PSTE Place 1 Application onto teeth See admin instructions. Brush on teeth with a toothbrush after evening mouth care. Spit out excess and do not rinse.   torsemide  (DEMADEX ) 20 MG tablet Take 20 mg by mouth every morning.   umeclidinium-vilanterol (ANORO ELLIPTA ) 62.5-25 MCG/ACT AEPB Inhale 1 puff into the lungs every morning.   No facility-administered encounter  medications on file as of 06/10/2024.    Review of Systems:  Review of Systems  Constitutional:  Negative for activity change and appetite change.  HENT: Negative.    Respiratory:  Negative for cough and shortness of breath.   Cardiovascular:  Positive for leg swelling.  Gastrointestinal:  Positive for constipation.  Genitourinary: Negative.   Musculoskeletal:  Positive for arthralgias and gait problem. Negative for myalgias.  Skin: Negative.   Neurological:  Negative for dizziness and weakness.  Psychiatric/Behavioral:  Negative for confusion, dysphoric mood and sleep disturbance.     Health Maintenance  Topic Date Due  Bone Density Scan  Never done   COVID-19 Vaccine (6 - 2025-26 season) 03/01/2024   Medicare Annual Wellness (AWV)  04/03/2024   DTaP/Tdap/Td (6 - Td or Tdap) 03/12/2028   Pneumococcal Vaccine: 50+ Years  Completed   Influenza Vaccine  Completed   Zoster Vaccines- Shingrix  Completed   Meningococcal B Vaccine  Aged Out    Physical Exam: Vitals:   06/10/24 1401  BP: 107/61  Pulse: 75  Resp: 18  Temp: 97.7 F (36.5 C)  Weight: 165 lb (74.8 kg)   Body mass index is 31.18 kg/m. Physical Exam Vitals reviewed.  Constitutional:      Appearance: Normal appearance.  HENT:     Head: Normocephalic.     Nose: Nose normal.     Mouth/Throat:     Mouth: Mucous membranes are moist.     Pharynx: Oropharynx is clear.  Eyes:     Pupils: Pupils are equal, round, and reactive to light.  Cardiovascular:     Rate and Rhythm: Normal rate and regular rhythm.     Pulses: Normal pulses.     Heart sounds: Normal heart sounds. No murmur heard. Pulmonary:     Effort: Pulmonary effort is normal.     Breath sounds: Normal breath sounds.  Abdominal:     General: Abdomen is flat. Bowel sounds are normal.     Palpations: Abdomen is soft.  Musculoskeletal:        General: Swelling present.     Cervical back: Neck supple.     Comments: Does have Shoulder pain and Frozen  in Right Arm  Skin:    General: Skin is warm.  Neurological:     General: No focal deficit present.     Mental Status: She is alert and oriented to person, place, and time.  Psychiatric:        Mood and Affect: Mood normal.        Thought Content: Thought content normal.     Labs reviewed: Basic Metabolic Panel: Recent Labs    11/19/23 0904 11/20/23 0254 11/21/23 0310 12/29/23 0247 12/30/23 0324 12/31/23 0533 03/14/24 0313 03/15/24 0301 03/18/24 0452 03/23/24 0000  NA  --  141   < > 139 139   < > 138 142 143 140  K  --  3.8   < > 3.6 3.4*   < > 3.9 3.6 3.6 3.3*  CL  --  112*   < > 109 109   < > 107 110 108 97*  CO2  --  23   < > 23 23   < > 22 24 26  33*  GLUCOSE  --  130*   < > 99 122*   < > 138* 101* 105*  --   BUN  --  13   < > 9 7*   < > 13 12 7* 12  CREATININE  --  <0.30*   < > 0.36* 0.40*   < > 0.44 0.31* 0.31* 0.6  CALCIUM   --  7.9*   < > 8.2* 8.1*   < > 7.7* 7.8* 8.3* 8.8  MG  --  2.0   < > 2.5* 2.3  --  2.4  --   --   --   PHOS 2.2* 1.8*  --   --   --   --  3.0  --   --   --    < > = values in this interval not displayed.   Liver Function Tests:  Recent Labs    12/28/23 0411 03/13/24 2052 03/18/24 0452 03/23/24 0000  AST 20 23 19 16   ALT 9 8 6 9   ALKPHOS 49 51 41 49  BILITOT 1.9* 0.3 0.3  --   PROT 4.9* 6.4* 5.2*  --   ALBUMIN 2.9* 3.8 3.2* 3.6   No results for input(s): LIPASE, AMYLASE in the last 8760 hours. No results for input(s): AMMONIA in the last 8760 hours. CBC: Recent Labs    03/17/24 1331 03/17/24 1707 03/18/24 0452 03/23/24 0000  WBC 3.7* 3.6* 3.7* 6.1  NEUTROABS  --  2.2 2.0 3,764.00  HGB 7.6* 7.6* 7.6* 8.3*  HCT 25.1* 24.8* 24.2* 27*  MCV 100.8* 99.6 99.6  --   PLT 128* 136* 146* 259   Lipid Panel: Recent Labs    07/14/23 0000  CHOL 125  HDL 51  LDLCALC 53  TRIG 130   No results found for: HGBA1C  Procedures since last visit: No results found.  Assessment/Plan 1. Recurent GI bleed (Primary) No Recent  Episodes  2. Gastroesophageal reflux disease without esophagitis  Protonix   3. Acute on chronic anemia Continue Iron   4. COPD On Anoro Night time Oxygen   5. HLD On statin 6. Recurrent depression Zoloft   7. Senile osteoporosis On Fosamax since 05/2021  8. Primary osteoarthritis involving multiple joints Tylenol  PRN Refused to see Ortho  9. Restless leg syndrome On Requip   10. Mixed stress and urge urinary incontinence Ditropan     Labs/tests ordered:  CBC,CMP,TSH,Lipid, Vitamin D  and B 12 levels Next appt:  Visit date not found        [1]  Allergies Allergen Reactions   Morphine And Codeine Itching   Aspirin Other (See Comments)    Unknown reaction   Duloxetine  Hcl Other (See Comments)    Unknown reaction   Tymlos [Abaloparatide] Other (See Comments)    Unknown reaction   Ventolin  [Albuterol ] Other (See Comments)    rapid heart beat   Cefadroxil Hives    Patient can take amoxicillin  and cipro 

## 2024-06-15 DIAGNOSIS — R41841 Cognitive communication deficit: Secondary | ICD-10-CM | POA: Diagnosis not present

## 2024-06-17 DIAGNOSIS — R41841 Cognitive communication deficit: Secondary | ICD-10-CM | POA: Diagnosis not present
